# Patient Record
Sex: Female | Born: 1945
Health system: Southern US, Community
[De-identification: ages and names within clinical notes are randomized; demographics above are authoritative.]

## PROBLEM LIST (undated history)

## (undated) DIAGNOSIS — I4891 Unspecified atrial fibrillation: Secondary | ICD-10-CM

## (undated) DIAGNOSIS — E785 Hyperlipidemia, unspecified: Secondary | ICD-10-CM

## (undated) DIAGNOSIS — M25561 Pain in right knee: Secondary | ICD-10-CM

## (undated) DIAGNOSIS — G5600 Carpal tunnel syndrome, unspecified upper limb: Secondary | ICD-10-CM

## (undated) DIAGNOSIS — D631 Anemia in chronic kidney disease: Secondary | ICD-10-CM

## (undated) DIAGNOSIS — K219 Gastro-esophageal reflux disease without esophagitis: Secondary | ICD-10-CM

## (undated) DIAGNOSIS — D500 Iron deficiency anemia secondary to blood loss (chronic): Secondary | ICD-10-CM

## (undated) DIAGNOSIS — M069 Rheumatoid arthritis, unspecified: Secondary | ICD-10-CM

## (undated) DIAGNOSIS — M1990 Unspecified osteoarthritis, unspecified site: Secondary | ICD-10-CM

## (undated) DIAGNOSIS — I5032 Chronic diastolic (congestive) heart failure: Secondary | ICD-10-CM

## (undated) DIAGNOSIS — D649 Anemia, unspecified: Secondary | ICD-10-CM

## (undated) DIAGNOSIS — I739 Peripheral vascular disease, unspecified: Secondary | ICD-10-CM

## (undated) DIAGNOSIS — R17 Unspecified jaundice: Secondary | ICD-10-CM

## (undated) DIAGNOSIS — I639 Cerebral infarction, unspecified: Secondary | ICD-10-CM

## (undated) DIAGNOSIS — I10 Essential (primary) hypertension: Secondary | ICD-10-CM

## (undated) DIAGNOSIS — T45515A Adverse effect of anticoagulants, initial encounter: Secondary | ICD-10-CM

## (undated) DIAGNOSIS — L570 Actinic keratosis: Secondary | ICD-10-CM

## (undated) DIAGNOSIS — R945 Abnormal results of liver function studies: Secondary | ICD-10-CM

## (undated) DIAGNOSIS — K449 Diaphragmatic hernia without obstruction or gangrene: Secondary | ICD-10-CM

## (undated) DIAGNOSIS — E119 Type 2 diabetes mellitus without complications: Secondary | ICD-10-CM

## (undated) DIAGNOSIS — K759 Inflammatory liver disease, unspecified: Secondary | ICD-10-CM

## (undated) DIAGNOSIS — F419 Anxiety disorder, unspecified: Secondary | ICD-10-CM

## (undated) DIAGNOSIS — N183 Chronic kidney disease, stage 3 (moderate): Secondary | ICD-10-CM

## (undated) DIAGNOSIS — R609 Edema, unspecified: Secondary | ICD-10-CM

## (undated) HISTORY — DX: Gastro-esophageal reflux disease without esophagitis: K21.9

## (undated) HISTORY — DX: Actinic keratosis: L57.0

## (undated) HISTORY — PX: HAND SURGERY: SHX662

## (undated) HISTORY — DX: Iron deficiency anemia secondary to blood loss (chronic): D50.0

## (undated) HISTORY — DX: Rheumatoid arthritis, unspecified: M06.9

## (undated) HISTORY — DX: Diaphragmatic hernia without obstruction or gangrene: K44.9

## (undated) HISTORY — PX: JOINT REPLACEMENT: SHX530

## (undated) HISTORY — DX: Edema, unspecified: R60.9

## (undated) HISTORY — DX: Adverse effect of anticoagulants, initial encounter: T45.515A

## (undated) HISTORY — DX: Unspecified atrial fibrillation: I48.91

## (undated) HISTORY — DX: Unspecified jaundice: R17

## (undated) HISTORY — DX: Chronic kidney disease, stage 3 (moderate): N18.3

## (undated) HISTORY — DX: Carpal tunnel syndrome, unspecified upper limb: G56.00

## (undated) HISTORY — DX: Essential (primary) hypertension: I10

## (undated) HISTORY — PX: CATARACT EXTRACTION: SUR2

## (undated) HISTORY — DX: Anemia, unspecified: D64.9

## (undated) HISTORY — DX: Inflammatory liver disease, unspecified: K75.9

## (undated) HISTORY — DX: Anemia in chronic kidney disease: D63.1

## (undated) HISTORY — DX: Chronic diastolic (congestive) heart failure: I50.32

## (undated) HISTORY — DX: Hyperlipidemia, unspecified: E78.5

---

## 1968-04-04 DIAGNOSIS — K759 Inflammatory liver disease, unspecified: Secondary | ICD-10-CM

## 1968-04-04 HISTORY — DX: Inflammatory liver disease, unspecified: K75.9

## 1997-10-27 ENCOUNTER — Other Ambulatory Visit: Admission: RE | Admit: 1997-10-27 | Discharge: 1997-10-27 | Payer: Self-pay | Admitting: Family Medicine

## 1998-02-24 ENCOUNTER — Ambulatory Visit (HOSPITAL_COMMUNITY): Admission: RE | Admit: 1998-02-24 | Discharge: 1998-02-24 | Payer: Self-pay | Admitting: Obstetrics and Gynecology

## 1998-02-24 ENCOUNTER — Encounter: Payer: Self-pay | Admitting: Obstetrics and Gynecology

## 1998-06-23 ENCOUNTER — Encounter: Payer: Self-pay | Admitting: Endocrinology

## 1998-06-23 ENCOUNTER — Ambulatory Visit (HOSPITAL_COMMUNITY): Admission: RE | Admit: 1998-06-23 | Discharge: 1998-06-23 | Payer: Self-pay | Admitting: Endocrinology

## 1998-07-01 ENCOUNTER — Ambulatory Visit (HOSPITAL_COMMUNITY): Admission: RE | Admit: 1998-07-01 | Discharge: 1998-07-01 | Payer: Self-pay | Admitting: Endocrinology

## 1998-07-01 ENCOUNTER — Encounter: Payer: Self-pay | Admitting: Endocrinology

## 1999-01-14 ENCOUNTER — Other Ambulatory Visit: Admission: RE | Admit: 1999-01-14 | Discharge: 1999-01-14 | Payer: Self-pay | Admitting: Orthopedic Surgery

## 1999-02-18 ENCOUNTER — Encounter: Admission: RE | Admit: 1999-02-18 | Discharge: 1999-02-18 | Payer: Self-pay | Admitting: Family Medicine

## 1999-02-18 ENCOUNTER — Encounter: Payer: Self-pay | Admitting: Family Medicine

## 1999-08-23 ENCOUNTER — Ambulatory Visit (HOSPITAL_COMMUNITY): Admission: RE | Admit: 1999-08-23 | Discharge: 1999-08-23 | Payer: Self-pay | Admitting: *Deleted

## 1999-08-23 ENCOUNTER — Encounter: Payer: Self-pay | Admitting: *Deleted

## 1999-09-16 ENCOUNTER — Encounter: Payer: Self-pay | Admitting: *Deleted

## 1999-09-16 ENCOUNTER — Ambulatory Visit (HOSPITAL_COMMUNITY): Admission: RE | Admit: 1999-09-16 | Discharge: 1999-09-16 | Payer: Self-pay | Admitting: *Deleted

## 1999-09-16 ENCOUNTER — Encounter (INDEPENDENT_AMBULATORY_CARE_PROVIDER_SITE_OTHER): Payer: Self-pay | Admitting: Specialist

## 2000-02-21 ENCOUNTER — Encounter: Admission: RE | Admit: 2000-02-21 | Discharge: 2000-02-21 | Payer: Self-pay | Admitting: Family Medicine

## 2000-02-21 ENCOUNTER — Encounter: Payer: Self-pay | Admitting: Family Medicine

## 2000-03-02 ENCOUNTER — Encounter: Admission: RE | Admit: 2000-03-02 | Discharge: 2000-03-02 | Payer: Self-pay | Admitting: Family Medicine

## 2000-03-02 ENCOUNTER — Encounter: Payer: Self-pay | Admitting: Family Medicine

## 2000-03-13 ENCOUNTER — Encounter (INDEPENDENT_AMBULATORY_CARE_PROVIDER_SITE_OTHER): Payer: Self-pay | Admitting: Specialist

## 2000-03-13 ENCOUNTER — Encounter: Payer: Self-pay | Admitting: Endocrinology

## 2000-03-13 ENCOUNTER — Other Ambulatory Visit: Admission: RE | Admit: 2000-03-13 | Discharge: 2000-03-13 | Payer: Self-pay | Admitting: Endocrinology

## 2000-03-13 ENCOUNTER — Encounter: Admission: RE | Admit: 2000-03-13 | Discharge: 2000-03-13 | Payer: Self-pay | Admitting: Endocrinology

## 2001-03-05 ENCOUNTER — Encounter: Admission: RE | Admit: 2001-03-05 | Discharge: 2001-03-05 | Payer: Self-pay | Admitting: Family Medicine

## 2001-03-05 ENCOUNTER — Encounter: Payer: Self-pay | Admitting: Family Medicine

## 2001-07-24 ENCOUNTER — Encounter (INDEPENDENT_AMBULATORY_CARE_PROVIDER_SITE_OTHER): Payer: Self-pay | Admitting: *Deleted

## 2001-07-24 ENCOUNTER — Ambulatory Visit (HOSPITAL_COMMUNITY): Admission: RE | Admit: 2001-07-24 | Discharge: 2001-07-24 | Payer: Self-pay | Admitting: *Deleted

## 2002-01-31 ENCOUNTER — Encounter: Payer: Self-pay | Admitting: Family Medicine

## 2002-01-31 ENCOUNTER — Ambulatory Visit (HOSPITAL_COMMUNITY): Admission: RE | Admit: 2002-01-31 | Discharge: 2002-01-31 | Payer: Self-pay | Admitting: Family Medicine

## 2002-03-21 ENCOUNTER — Encounter: Payer: Self-pay | Admitting: Family Medicine

## 2002-03-21 ENCOUNTER — Encounter: Admission: RE | Admit: 2002-03-21 | Discharge: 2002-03-21 | Payer: Self-pay | Admitting: Family Medicine

## 2002-03-26 ENCOUNTER — Ambulatory Visit (HOSPITAL_BASED_OUTPATIENT_CLINIC_OR_DEPARTMENT_OTHER): Admission: RE | Admit: 2002-03-26 | Discharge: 2002-03-27 | Payer: Self-pay | Admitting: Orthopedic Surgery

## 2002-12-15 ENCOUNTER — Emergency Department (HOSPITAL_COMMUNITY): Admission: AD | Admit: 2002-12-15 | Discharge: 2002-12-15 | Payer: Self-pay | Admitting: Emergency Medicine

## 2002-12-15 ENCOUNTER — Encounter: Payer: Self-pay | Admitting: Emergency Medicine

## 2003-04-11 ENCOUNTER — Encounter: Admission: RE | Admit: 2003-04-11 | Discharge: 2003-04-11 | Payer: Self-pay | Admitting: Endocrinology

## 2004-04-26 ENCOUNTER — Encounter: Admission: RE | Admit: 2004-04-26 | Discharge: 2004-04-26 | Payer: Self-pay | Admitting: Family Medicine

## 2005-04-27 ENCOUNTER — Encounter: Admission: RE | Admit: 2005-04-27 | Discharge: 2005-04-27 | Payer: Self-pay | Admitting: Family Medicine

## 2006-05-01 ENCOUNTER — Encounter: Admission: RE | Admit: 2006-05-01 | Discharge: 2006-05-01 | Payer: Self-pay | Admitting: Family Medicine

## 2006-05-12 ENCOUNTER — Encounter: Admission: RE | Admit: 2006-05-12 | Discharge: 2006-05-12 | Payer: Self-pay | Admitting: Family Medicine

## 2006-10-17 ENCOUNTER — Ambulatory Visit (HOSPITAL_COMMUNITY): Admission: RE | Admit: 2006-10-17 | Discharge: 2006-10-17 | Payer: Self-pay | Admitting: *Deleted

## 2006-10-17 ENCOUNTER — Encounter (INDEPENDENT_AMBULATORY_CARE_PROVIDER_SITE_OTHER): Payer: Self-pay | Admitting: *Deleted

## 2007-04-05 HISTORY — PX: TOTAL HIP ARTHROPLASTY: SHX124

## 2007-06-14 ENCOUNTER — Encounter: Admission: RE | Admit: 2007-06-14 | Discharge: 2007-06-14 | Payer: Self-pay

## 2008-02-11 ENCOUNTER — Inpatient Hospital Stay (HOSPITAL_COMMUNITY): Admission: RE | Admit: 2008-02-11 | Discharge: 2008-02-14 | Payer: Self-pay | Admitting: Orthopedic Surgery

## 2008-02-24 ENCOUNTER — Inpatient Hospital Stay (HOSPITAL_COMMUNITY): Admission: EM | Admit: 2008-02-24 | Discharge: 2008-02-26 | Payer: Self-pay | Admitting: Emergency Medicine

## 2008-06-16 ENCOUNTER — Encounter: Admission: RE | Admit: 2008-06-16 | Discharge: 2008-06-16 | Payer: Self-pay | Admitting: Family Medicine

## 2008-06-20 ENCOUNTER — Encounter: Admission: RE | Admit: 2008-06-20 | Discharge: 2008-06-20 | Payer: Self-pay | Admitting: Family Medicine

## 2009-01-29 ENCOUNTER — Encounter: Admission: RE | Admit: 2009-01-29 | Discharge: 2009-01-29 | Payer: Self-pay | Admitting: Family Medicine

## 2009-06-17 ENCOUNTER — Encounter: Admission: RE | Admit: 2009-06-17 | Discharge: 2009-06-17 | Payer: Self-pay | Admitting: Family Medicine

## 2009-12-03 ENCOUNTER — Encounter (HOSPITAL_COMMUNITY): Admission: RE | Admit: 2009-12-03 | Discharge: 2010-03-03 | Payer: Self-pay | Admitting: Unknown Physician Specialty

## 2010-03-10 ENCOUNTER — Encounter (HOSPITAL_COMMUNITY)
Admission: RE | Admit: 2010-03-10 | Discharge: 2010-05-04 | Payer: Self-pay | Source: Home / Self Care | Attending: Unknown Physician Specialty | Admitting: Unknown Physician Specialty

## 2010-04-25 ENCOUNTER — Encounter: Payer: Self-pay | Admitting: Family Medicine

## 2010-04-26 ENCOUNTER — Encounter: Payer: Self-pay | Admitting: Family Medicine

## 2010-05-12 ENCOUNTER — Other Ambulatory Visit: Payer: Self-pay | Admitting: Family Medicine

## 2010-05-12 DIAGNOSIS — Z1231 Encounter for screening mammogram for malignant neoplasm of breast: Secondary | ICD-10-CM

## 2010-05-26 ENCOUNTER — Telehealth: Payer: Self-pay | Admitting: Cardiovascular Disease

## 2010-05-26 NOTE — Telephone Encounter (Signed)
REQUESTING FIRST AVAILABLE CONSULT. PLEASE CALL PT AT [phone].  PLEASE LET LORI FROM DR. Tawana Scale OFFICE KNOW WHEN APPT IS SO SHE CAN FAX RECORDS

## 2010-05-27 ENCOUNTER — Ambulatory Visit (INDEPENDENT_AMBULATORY_CARE_PROVIDER_SITE_OTHER): Payer: Medicare Other | Admitting: *Deleted

## 2010-05-27 ENCOUNTER — Inpatient Hospital Stay: Admission: AD | Admit: 2010-05-27 | Payer: Self-pay | Source: Ambulatory Visit | Admitting: Cardiology

## 2010-05-27 ENCOUNTER — Inpatient Hospital Stay (HOSPITAL_COMMUNITY)
Admission: EM | Admit: 2010-05-27 | Discharge: 2010-05-28 | DRG: 309 | Disposition: A | Discharge status: Home / Self Care | Payer: Medicare Other | Attending: Cardiology | Admitting: Cardiology

## 2010-05-27 ENCOUNTER — Emergency Department (HOSPITAL_COMMUNITY): Payer: Medicare Other

## 2010-05-27 DIAGNOSIS — Z0000 Encounter for general adult medical examination without abnormal findings: Secondary | ICD-10-CM

## 2010-05-27 DIAGNOSIS — I4891 Unspecified atrial fibrillation: Secondary | ICD-10-CM

## 2010-05-27 DIAGNOSIS — K219 Gastro-esophageal reflux disease without esophagitis: Secondary | ICD-10-CM | POA: Diagnosis present

## 2010-05-27 DIAGNOSIS — I10 Essential (primary) hypertension: Secondary | ICD-10-CM | POA: Diagnosis present

## 2010-05-27 DIAGNOSIS — M069 Rheumatoid arthritis, unspecified: Secondary | ICD-10-CM | POA: Diagnosis present

## 2010-05-27 DIAGNOSIS — IMO0002 Reserved for concepts with insufficient information to code with codable children: Secondary | ICD-10-CM

## 2010-05-27 DIAGNOSIS — N390 Urinary tract infection, site not specified: Secondary | ICD-10-CM | POA: Diagnosis present

## 2010-05-27 DIAGNOSIS — E785 Hyperlipidemia, unspecified: Secondary | ICD-10-CM | POA: Diagnosis present

## 2010-05-27 DIAGNOSIS — F341 Dysthymic disorder: Secondary | ICD-10-CM | POA: Diagnosis present

## 2010-05-27 DIAGNOSIS — D649 Anemia, unspecified: Secondary | ICD-10-CM | POA: Diagnosis present

## 2010-05-27 DIAGNOSIS — R079 Chest pain, unspecified: Secondary | ICD-10-CM

## 2010-05-27 DIAGNOSIS — Z96659 Presence of unspecified artificial knee joint: Secondary | ICD-10-CM

## 2010-05-27 DIAGNOSIS — E119 Type 2 diabetes mellitus without complications: Secondary | ICD-10-CM | POA: Diagnosis present

## 2010-05-27 LAB — COMPREHENSIVE METABOLIC PANEL WITH GFR
ALT: 14 U/L (ref 0–35)
AST: 17 U/L (ref 0–37)
Albumin: 3.2 g/dL — ABNORMAL LOW (ref 3.5–5.2)
Alkaline Phosphatase: 84 U/L (ref 39–117)
BUN: 10 mg/dL (ref 6–23)
CO2: 25 meq/L (ref 19–32)
Calcium: 8.8 mg/dL (ref 8.4–10.5)
Chloride: 99 meq/L (ref 96–112)
Creatinine, Ser: 0.55 mg/dL (ref 0.4–1.2)
GFR calc Af Amer: >60 mL/min (ref >60)
GFR calc non Af Amer: >60 mL/min (ref >60)
Glucose, Bld: 141 mg/dL — ABNORMAL HIGH (ref 70–99)
Potassium: 3.1 meq/L — ABNORMAL LOW (ref 3.5–5.1)
Sodium: 137 meq/L (ref 135–145)
Total Bilirubin: 0.8 mg/dL (ref 0.3–1.2)
Total Protein: 7.5 g/dL (ref 6.0–8.3)

## 2010-05-27 LAB — URINALYSIS, ROUTINE W REFLEX MICROSCOPIC
Bilirubin Urine: NEGATIVE
Ketones, ur: NEGATIVE mg/dL
Protein, ur: NEGATIVE mg/dL
Urine Glucose, Fasting: NEGATIVE mg/dL
pH: 6 (ref 5.0–8.0)

## 2010-05-27 LAB — DIFFERENTIAL
Basophils Absolute: 0 10*3/uL (ref 0.0–0.1)
Eosinophils Relative: 3 % (ref 0–5)
Lymphs Abs: 3.2 10*3/uL (ref 0.7–4.0)
Monocytes Absolute: 1.1 10*3/uL — ABNORMAL HIGH (ref 0.1–1.0)
Monocytes Relative: 8 % (ref 3–12)
Neutro Abs: 8.3 10*3/uL — ABNORMAL HIGH (ref 1.7–7.7)
Neutrophils Relative %: 64 % (ref 43–77)

## 2010-05-27 LAB — CBC
HCT: 39.2 % (ref 36.0–46.0)
Hemoglobin: 12.4 g/dL (ref 12.0–15.0)
MCH: 24.1 pg — ABNORMAL LOW (ref 26.0–34.0)
MCHC: 31.6 g/dL (ref 30.0–36.0)
MCV: 76.1 fL — ABNORMAL LOW (ref 78.0–100.0)
Platelets: 355 K/uL (ref 150–400)
RBC: 5.15 MIL/uL — ABNORMAL HIGH (ref 3.87–5.11)
RDW: 15.3 % (ref 11.5–15.5)
WBC: 12.9 K/uL — ABNORMAL HIGH (ref 4.0–10.5)

## 2010-05-27 LAB — PROTIME-INR: INR: 1.13 (ref 0.00–1.49)

## 2010-05-27 LAB — CK TOTAL AND CKMB (NOT AT ARMC)
CK, MB: 2.5 ng/mL (ref 0.3–4.0)
Relative Index: INVALID (ref 0.0–2.5)
Total CK: 65 U/L (ref 7–177)

## 2010-05-27 LAB — URINE MICROSCOPIC-ADD ON

## 2010-05-27 LAB — APTT: aPTT: 27 s (ref 24–37)

## 2010-05-27 LAB — TSH: TSH: 1.059 u[IU]/mL (ref 0.350–4.500)

## 2010-05-27 LAB — TROPONIN I: Troponin I: <0.01 ng/mL (ref 0.00–0.06)

## 2010-05-27 LAB — GLUCOSE, CAPILLARY
Glucose-Capillary: 219 mg/dL — ABNORMAL HIGH (ref 70–99)
Glucose-Capillary: 211 mg/dL — ABNORMAL HIGH (ref 70–99)

## 2010-05-28 DIAGNOSIS — I4891 Unspecified atrial fibrillation: Secondary | ICD-10-CM

## 2010-05-28 LAB — CARDIAC PANEL(CRET KIN+CKTOT+MB+TROPI)
CK, MB: 2.4 ng/mL (ref 0.3–4.0)
Relative Index: INVALID (ref 0.0–2.5)
Relative Index: INVALID (ref 0.0–2.5)
Total CK: 55 U/L (ref 7–177)
Troponin I: 0.01 ng/mL (ref 0.00–0.06)
Troponin I: 0.03 ng/mL (ref 0.00–0.06)

## 2010-05-28 LAB — HEPARIN LEVEL (UNFRACTIONATED): Heparin Unfractionated: <0.1 IU/mL — ABNORMAL LOW (ref 0.30–0.70)

## 2010-05-28 LAB — CBC
MCHC: 30.6 g/dL (ref 30.0–36.0)
Platelets: 324 10*3/uL (ref 150–400)
RDW: 15.5 % (ref 11.5–15.5)
WBC: 11 10*3/uL — ABNORMAL HIGH (ref 4.0–10.5)

## 2010-05-28 LAB — GLUCOSE, CAPILLARY: Glucose-Capillary: 192 mg/dL — ABNORMAL HIGH (ref 70–99)

## 2010-06-03 NOTE — Discharge Summary (Addendum)
NAMESARAHBETH, [name]                ACCOUNT NO.:  [id number]  MEDICAL RECORD NO.:  [id number]           PATIENT TYPE:  I  LOCATION:  2502                         FACILITY:  [facility]  PHYSICIAN:  [name], M.D.DATE OF BIRTH:  1945/10/15  DATE OF ADMISSION:  05/27/2010 DATE OF DISCHARGE:  05/28/2010                              DISCHARGE SUMMARY   DISCHARGE DIAGNOSES: 1. New onset atrial fibrillation with rapid ventricular response.     a.     Spontaneously converted normal sinus rhythm.     b.     Discharged with diltiazem and metoprolol as well Pradaxa for      anticoagulation. 2. Chest pain, cardiac enzymes negative. 3. Urinary tract infection, discharged with prescription for Cipro. 4. Hypertension. 5. Diabetes mellitus. 6. Rheumatoid arthritis. 7. Anxiety and depression. 8. Gastroesophageal reflux disease. 9. Hyperlipidemia. 10.History of infantile jaundice with hepatitis. 11.Total left knee replacement in 2009. 12.Anemia with hemoglobin of 10.0, instructed to follow up with     primary care provider.  PENDING LABORATORY TESTS: 1. A 2-D echocardiogram. 2. The patient should have BMET with visit with [name] to check     her potassium level, and this was instructed to the office to make     in the appointment.  HOSPITAL COURSE:  [name] is a 65 year old female with a past medical history including hypertension, diabetes and hyperlipidemia who presented to Dr. Yevonne Pax office with complaints of atypical chest pain, with significant cough.  She had noted that her heart rate have been erratic for a long time.  She had no other symptoms except for mild shortness of breath.  In the office, she was known to me in AFib with RVR with EKG showing AFib with a rate of 160-170.  She was admitted to [facility] for further evaluation.  On admission, her lab work did show that she was hypokalemic at 3.1 and her diuretic was discontinued.  She was continued  on her daily potassium.  Cardiac enzymes were cycled which were negative x3 and TSH was within normal limits.  UA did demonstrate a probable UTI with positive nitrate, trace leukocytes and many bacteria.  She will be discharged with a prescription for Cipro.  A 2-D echocardiogram was performed, but those results are still pending at this time.  The patient converted to normal sinus rhythm spontaneously.  Dr. Deborah [name] has seen and examined her today and feels she is stable for discharge.  He would like her to continue diltiazem in a p.o. form of 240 mg daily and Toprol-XL 50 mg daily as well as start Pradaxa for anticoagulation.  The patient was made aware of her urinary tract infection as well as her anemia and is instructed to follow up with her primary care provider for these things.  Dr. Deborah [name] has seen and examined her today and feels she is stable for discharge.  DISCHARGE LABORATORY DATA:  WBC 11, hemoglobin 10.0, hematocrit 32.7, platelet count 324.  Sodium 137, potassium 3.1, chloride 99, CO2 25, glucose 141, BUN 10, creatinine 0.55.  LFTs within normal limits on May 27, 2010, with  exception of mildly decreased albumin at 3.2. Cardiac enzymes negative x3, TSH 1.059.  UA showed positive nitrite, trace leukocytes, few squamous epithelial cells and many bacteria.  STUDIES: 1. Chest x-ray May 27, 2010, showed mild cardiomegaly increased     since 2009, with stable lungs with chronic interstitial prominence. 2. A 2-D echocardiogram, pending at this time.  DISCHARGE MEDICATIONS: 1. Cipro 250 mg every 12 h. for 3 days. 2. Dabigatran 150 mg q.12 h. 3. Diltiazem 240 mg daily. 4. Nitroglycerin sublingual 0.4 mg every 5 minutes as needed. 5. Toprol-XL 50 mg daily. 6. Bupropion SR 150 mg 2 tablets daily. 7. Fish oil 1000 units 2 capsules daily. 8. Klor-Con 10 mEq daily. 9. Leflunomide 10 mg daily. 10.Metformin XR 500 mg 6 tablets daily. 11.Nexium 40 mg daily. 12.Orencia  125 mg one injection subcutaneously weekly on Friday. 13.Prednisone 5 mg daily. 14.Prempro 0.3/1.5 mg daily. 15.Simvastatin 20 mg daily. 16.Vitamin B12 1000 units 1 tablet daily. 17.Vitamin D 400 units 1 tablet daily.  DISPOSITION:  [name] will be discharged in stable condition to home. She is to increase activity slowly and follow a heart-healthy diet.  She is instructed to follow up with her primary care provider within 1 week to ensure UTIs and also for evaluation of her anemia as her blood counts are mildly decreased this admission.  She will follow up with [name] in approximately 2 weeks and our office, we will call her with this appointment.  I have also requested the office staff to go in for a BMET to ensure stability for potassium as it  was low on admission.  She was on a diuretic, however, which has been discontinued.  Her daily low- dose potassium will be continued.  [name] may consider risk stratification at some point with possible stress testing given the patient's risk factors.  Dr. Deborah [name] has seen and examined her today and feels she is stable for discharge.  Duration of discharge encounter greater than 30 minutes including physician and PA time.     [name], P.A.C.   ______________________________ [name], M.D.    DD/MEDQ  D:  05/28/2010  T:  05/29/2010  Job:  [id number]  cc:   [name], M.D.  Electronically Signed by [name] M.D. on 06/03/2010 01:07:33 PM Electronically Signed by [name]  on 06/07/2010 05:10:31 PM

## 2010-06-08 ENCOUNTER — Ambulatory Visit (INDEPENDENT_AMBULATORY_CARE_PROVIDER_SITE_OTHER): Payer: Medicare Other | Admitting: Nurse Practitioner

## 2010-06-08 DIAGNOSIS — I4891 Unspecified atrial fibrillation: Secondary | ICD-10-CM

## 2010-06-08 DIAGNOSIS — R079 Chest pain, unspecified: Secondary | ICD-10-CM

## 2010-06-08 DIAGNOSIS — I10 Essential (primary) hypertension: Secondary | ICD-10-CM

## 2010-06-08 DIAGNOSIS — R5381 Other malaise: Secondary | ICD-10-CM

## 2010-06-11 NOTE — H&P (Signed)
NAMESTEFANEE, MCKELL                ACCOUNT NO.:  [id number]  MEDICAL RECORD NO.:  [id number]           PATIENT TYPE:  LOCATION:                                 FACILITY:  PHYSICIAN:  Cassell Clement, M.D.      DATE OF BIRTH:  DATE OF ADMISSION:  05/27/2010 DATE OF DISCHARGE:                             HISTORY & PHYSICAL   CHIEF COMPLAINT:  Chest pain.  HISTORY OF PRESENT ILLNESS:  [name] is a pleasant 65 year old white female who has known hypertension.  She is referred to our office from primary care.  She has been complaining of atypical chest pain.  She notes that all this started this past Monday.  Her significant other had been driving from [place] and was calling her on the phone every 15 minutes.  She does have a tendency to be somewhat anxious and is only added to her anxiety level.  She was not able to sleep because he arrived home very late.  By Tuesday, she was having some sharp chest pain.  She also noted that she had significant cough and was coughing very hard.  She noted that her heart rate has been erratic for "a long time."  She has had no other associated symptoms except for maybe some mild shortness of breath.  Here in the office today, she is noted to be in atrial fibrillation with a rapid ventricular response.  Her EKG shows a heart rate 152 but on exam, she is clearly in the 160-170 range.  She is subsequently admitted for further evaluation.  PAST MEDICAL HISTORY:  She had a total left hip replacement in 2009. She does have rheumatoid arthritis, hypertension, diabetes, infantile jaundice with a history of hepatitis, anxiety and depression.  Her hypertension has been longstanding.  Her diabetes is treated with oral agents.  She does also have a history of GERD and hyperlipidemia.  ALLERGIES:  None.  INTOLERANCES:  ASPIRIN in high doses causes an upset stomach as well as VICODIN.  CURRENT MEDICATIONS: 1. Metformin 500 mg a day. 2.  Amiloride 5/50 (diuretic) daily for hypertension. 3. Prednisone 5 mg a day. 4. Nexium 40 mg a day. 5. Bupropion 150 mg a day. 6. Prempro 0.3 daily. 7. Hydrocodone p.r.n. 8. Xanax p.r.n. 9. Fish oil daily. 10.Aleve p.r.n. 11.Leflunomide 10 mg a day. 12.Potassium 10 mEq a day. 13.Orencia weekly. 14.Simvastatin 20 mg a day. 15.Vitamin D daily.  FAMILY HISTORY:  Positive for diabetes, heart disease, hyperlipidemia, lung cancer and stroke.  SOCIAL HISTORY:  She is a former smoker.  She is divorced.  She does not drink alcohol and she is retired.  She does live with a significant other.  PAST SURGICAL HISTORY:  Includes cataracts, hand surgery and hip replacement.  REVIEW OF SYSTEMS:  She has had a cough that has been nonproductive. She described it as hacky and raspy in nature.  She has had some chest pain that was worse with coughing as well as with deep breathing.  She has had no recent fever, flu.  She does have chronic pain from her arthritis.  She is not very  active and is on engage in a routineexercise program.  She denies being lightheaded or dizzy.  She really has no awareness that her heart is beating this fast.  She has not had syncope.  All other review of systems are negative.  PHYSICAL EXAMINATION:  GENERAL:  She is a very pleasant elderly white female who is in no acute distress. SKIN:  Warm and dry.  Color is unremarkable. VITAL SIGNS:  Her weight 188 pounds, blood pressure 128/110 sitting, 134/96 standing, heart rate 152 but up into the 160s by apical exam, respirations are 20.  She is afebrile. HEENT:  Unremarkable except pupils are unequal from previous cataract surgery. NECK:  Supple.  No masses.  No bruits. LUNGS:  Fairly clear. CARDIAC:  Irregular and very tachycardic rhythm.  There is no murmur that I could appreciate. ABDOMEN:  Obese, soft. EXTREMITIES:  Without edema. MUSCULOSKELETAL:  Gait and range of motion to be intact. NEUROLOGIC:  No gross  focal deficits.  PERTINENT LABS:  Her EKG is reviewed with Dr. Deborah Chalk shows atrial fibrillation with rapid ventricular response.  OVERALL IMPRESSION: 1. Chest pain. 2. Atrial fibrillation with rapid ventricular response. 3. Longstanding hypertension. 4. Diabetes. 5. Rheumatoid arthritis. 6. Anxiety and depression.  PLAN:  She will be admitted to the hospital for anticoagulation and rate control.  We will place her on IV Cardizem as well as low-dose beta blocker therapy.  A  2-D echocardiogram will be obtained as well as complete lab work.  The further treatment plan to follow per Dr. Yevonne Pax discretion.     Rosalio Macadamia, N.P.   ______________________________ Cassell Clement, M.D.    LCG/MEDQ  D:  05/27/2010  T:  05/27/2010  Job:  [id number]  Electronically Signed by Norma Fredrickson N.P. on 06/09/2010 01:18:33 PM Electronically Signed by Cassell Clement M.D. on 06/10/2010 04:18:42 PM

## 2010-06-21 ENCOUNTER — Ambulatory Visit: Payer: Self-pay

## 2010-06-22 ENCOUNTER — Ambulatory Visit (HOSPITAL_COMMUNITY): Payer: Medicare Other | Attending: Cardiology | Admitting: Radiology

## 2010-06-22 DIAGNOSIS — R079 Chest pain, unspecified: Secondary | ICD-10-CM

## 2010-06-22 DIAGNOSIS — R0602 Shortness of breath: Secondary | ICD-10-CM

## 2010-06-22 DIAGNOSIS — I4891 Unspecified atrial fibrillation: Secondary | ICD-10-CM

## 2010-06-22 NOTE — Progress Notes (Signed)
[facility] SITE 3 NUCLEAR MED [address] [place] [address] [phone]  Cardiology Nuclear Med Study [name] female 24-Feb-1946   Nuclear Med Background Indication for Stress Test:  Evaluation for Ischemia and [facility] 05/29/10 New AFIB. History: 05/27/10 Echo:EF=65% Cardiac Risk Factors: Hypertension, Lipids and NIDDM  Symptoms:  Chest Pain (last date of chest discomfort 2 weeks ago), Palpitations and SOB   Nuclear Pre-Procedure Caffeine/Decaff Intake:  None NPO After: 7:30pm   Lungs:  clear IV 0.9% NS with Angio Cath:  22g  IV Site: R Hand  IV Started by:  Irean Hong, RN  Chest Size (in):  42 Cup Size:   DD  Height: 5\' 7"  (1.702 m)  Weight:  187 lb (84.823 kg)  BMI:  Body mass index is 29.29 kg/(m^2). Tech Comments:  NA    Nuclear Med Study 1 or 2 day study: 1 day  Stress Test Type:  Eugenie Birks  Reading MD: Arvilla Meres, MD  Order Authorizing Provider:  T.Brackbill  Resting Radionuclide: Technetium 20m Tetrofosmin  Resting Radionuclide Dose: 11 mCi   Stress Radionuclide:  Technetium 63m Tetrofosmin  Stress Radionuclide Dose: 33 mCi           Stress Protocol Rest HR: 88 Stress HR:113  Rest BP: 133/72 Stress BP:155/67  Exercise Time:  NA min METS: NA  Predicted HR:  % of Maximum: NA        Dose of Adenosine:  NA mg Dose of Lexiscan:  .4 mg  Dose of Atropine:  NA mg Dose of Dobutamine:  NA mcg/kg/min (at max HR)  Stress Test Technologist: Milana Na, EMT-P  Nuclear Technologist:  Domenic Polite, CNMT     Rest Procedure:  Myocardial perfusion imaging was performed at rest 45 minutes following the intravenous administration of Technetium 79m Tetrofosmin. Rest ECG: NSR  Stress Procedure:  The patient received IV Lexiscan 0.4 mg over 15-seconds.  Technetium 47m Tetrofosmin injected at 30-seconds.  There were no significant changes with Lexiscan.  Quantitative spect images were obtained after a 45 minute delay. Stress  ECG: No significant change from baseline ECG  QPS Raw Data Images:  On stress images appears to be a breast shadow covering the anterolateral wall. Stress Images:  Decreased uptake in the anterolateral wall. Rest Images:  Normal homogeneous uptake in all areas of the myocardium. Subtraction (SDS):  There is a fixed anterior defect that is most consistent with breast attenuation. However cannot rule out mild anterolateral ischemia.  Findings Risk Category:  Probably normal Lexiscan Myoview. Clinically Abnormal:  Dyspnea Ischemia:  See below Fixed Defect:  No LV Dysfunction:  No Transient Ischemic Dilatation (Normal <1.22):  1.10 Lung/Heart Ratio (Normal <0.45): .31  Quantitative Gated Spect Images QGS EDV:  75 ml QGS ESV: 21 ml QGS cine images:  Normal wall motion QGS EF: 72 %  Impression Exercise Capacity:  Lexiscan with no exercise. BP Response:  N/A Clinical Symptoms:  There is dyspnea. ECG Impression:  No significant ST segment change with Lexiscan. Comparison with Prior Nuclear Study: No previous images available for review.  Overall Impression:  Probably normal stress nuclear study. There is a small reversible defect in the anterolateral wall which appears to be due to shifting breast attenuation; however cannot exclude mild ischemia.

## 2010-06-29 ENCOUNTER — Ambulatory Visit: Payer: Self-pay

## 2010-07-01 ENCOUNTER — Ambulatory Visit (INDEPENDENT_AMBULATORY_CARE_PROVIDER_SITE_OTHER): Payer: Medicare Other | Admitting: Cardiology

## 2010-07-01 ENCOUNTER — Encounter: Payer: Self-pay | Admitting: Cardiology

## 2010-07-01 VITALS — BP 136/82 | HR 108 | Wt 186.0 lb

## 2010-07-01 DIAGNOSIS — E119 Type 2 diabetes mellitus without complications: Secondary | ICD-10-CM

## 2010-07-01 DIAGNOSIS — I480 Paroxysmal atrial fibrillation: Secondary | ICD-10-CM

## 2010-07-01 DIAGNOSIS — E1169 Type 2 diabetes mellitus with other specified complication: Secondary | ICD-10-CM | POA: Insufficient documentation

## 2010-07-01 DIAGNOSIS — I119 Hypertensive heart disease without heart failure: Secondary | ICD-10-CM

## 2010-07-01 DIAGNOSIS — M069 Rheumatoid arthritis, unspecified: Secondary | ICD-10-CM

## 2010-07-01 DIAGNOSIS — R799 Abnormal finding of blood chemistry, unspecified: Secondary | ICD-10-CM

## 2010-07-01 DIAGNOSIS — R7989 Other specified abnormal findings of blood chemistry: Secondary | ICD-10-CM

## 2010-07-01 DIAGNOSIS — I4891 Unspecified atrial fibrillation: Secondary | ICD-10-CM

## 2010-07-01 DIAGNOSIS — R0789 Other chest pain: Secondary | ICD-10-CM

## 2010-07-01 DIAGNOSIS — E7800 Pure hypercholesterolemia, unspecified: Secondary | ICD-10-CM

## 2010-07-01 DIAGNOSIS — E876 Hypokalemia: Secondary | ICD-10-CM

## 2010-07-01 DIAGNOSIS — I10 Essential (primary) hypertension: Secondary | ICD-10-CM | POA: Insufficient documentation

## 2010-07-01 DIAGNOSIS — R000 Tachycardia, unspecified: Secondary | ICD-10-CM | POA: Insufficient documentation

## 2010-07-01 MED ORDER — METOPROLOL SUCCINATE ER 100 MG PO TB24: 100.0000 mg | ORAL_TABLET | Freq: Every day | ORAL | Status: DC

## 2010-07-01 NOTE — Assessment & Plan Note (Signed)
Since her last office visit here the patient had a LexiScan Myoview stress test which showed no evidence of ischemia and which showed an ejection fraction of 72%.  This was done on 06/22/10.

## 2010-07-01 NOTE — Progress Notes (Signed)
History of Present Illness: This pleasant 65 year old woman is seen back for a followup office visit.  She has a history of multiple risk factors for coronary artery disease.  In February she was hospitalized with paroxysmal atrial fibrillation with a rapid ventricular response.  She is now on Pradaxa 150 mg twice a day.  As far she knows she has remained in normal sinus rhythm since her discharge from the hospital.  She had a recent LexiScan Myoview stress test which showed no significant ischemia.  She is diabetic.  She's not having any hypoglycemic episodes.  She previously had been on simvastatin but is now on Lipitor 10 mg daily and his not having any side effects on the Lipitor.  Current Outpatient Prescriptions  Medication Sig Dispense Refill  . Abatacept (ORENCIA Machias) Inject into the skin once a week.        [name] atorvastatin (LIPITOR) 10 MG tablet Take 10 mg by mouth daily.        [name] buPROPion (WELLBUTRIN SR) 150 MG 12 hr tablet Take 150 mg by mouth 2 (two) times daily.        . Cholecalciferol (VITAMIN D) 400 UNITS capsule Take 400 Units by mouth daily.        [name] Conj Estrog-Medroxyprogest Ace (PREMPRO PO) Take 0.3 mg by mouth daily.        . dabigatran (PRADAXA) 150 MG CAPS Take 150 mg by mouth every 12 (twelve) hours.        [name] diltiazem (CARDIZEM LA) 240 MG 24 hr tablet Take 240 mg by mouth daily.        [name] esomeprazole (NEXIUM) 40 MG capsule Take 40 mg by mouth daily.        . ferrous sulfate ([employer] IRON) 325 (65 FE) MG tablet Take 325 mg by mouth daily with breakfast. Taking 65mg  daily       . leflunomide (ARAVA) 10 MG tablet Take 10 mg by mouth daily.        . metFORMIN (GLUCOPHAGE-XR) 500 MG 24 hr tablet Take 500 mg by mouth 2 (two) times daily.        . Naproxen Sodium (ALEVE PO) Take by mouth as needed.        . nitroGLYCERIN (NITROSTAT) 0.4 MG SL tablet Place 0.4 mg under the tongue every 5 (five) minutes as needed.        . Omega-3 Fatty Acids (FISH OIL) 1000 MG CAPS Take by mouth daily.         . potassium chloride (KLOR-CON) 10 MEQ CR tablet Take 10 mEq by mouth 2 (two) times daily.        . predniSONE (DELTASONE) 5 MG tablet Take 5 mg by mouth daily.        [name] triamterene-hydrochlorothiazide (DYAZIDE) 37.5-25 MG per capsule Take 1 capsule by mouth daily.        . vitamin B-12 (CYANOCOBALAMIN) 1000 MCG tablet Take 1,000 mcg by mouth daily.        [name] DISCONTD: metoprolol (TOPROL-XL) 50 MG 24 hr tablet Take 50 mg by mouth daily.        . metoprolol (TOPROL XL) 100 MG 24 hr tablet Take 1 tablet (100 mg total) by mouth daily.  30 tablet  11    No Known Allergies  Patient Active Problem List  Diagnoses  . Paroxysmal atrial fibrillation  . Hypertension  . Hypercholesterolemia  . Diabetes mellitus  . Rheumatoid arthritis  . Sinus tachycardia  . Chest pain, atypical  History  Smoking status  . Former Smoker  Smokeless tobacco  . Not on file    History  Alcohol Use: Not on file    No family history on file.  Review of Systems: Constitutional: no fever chills diaphoresis or fatigue or change in weight.  Head and neck: no hearing loss, no epistaxis, no photophobia or visual disturbance. Respiratory: No cough, shortness of breath or wheezing. Cardiovascular: No chest pain peripheral edema, palpitations. Gastrointestinal: No abdominal distention, no abdominal pain, no change in bowel habits hematochezia or melena. Genitourinary: No dysuria, no frequency, no urgency, no nocturia. Musculoskeletal:No arthralgias, no back pain, no gait disturbance or myalgias. Neurological: No dizziness, no headaches, no numbness, no seizures, no syncope, no weakness, no tremors. Hematologic: No lymphadenopathy, no easy bruising. Psychiatric: No confusion, no hallucinations, no sleep disturbance.    Physical Exam: Filed Vitals:   07/01/10 0951  BP: 136/82  Pulse: 108   This is a well-developed well-nourished woman in no distress.Pupils equal and reactive.   Extraocular  Movements are full.  There is no scleral icterus.  The mouth and pharynx are normal.  The neck is supple.  The carotids reveal no bruits.  The jugular venous pressure is normal.  The thyroid is not enlarged.  There is no lymphadenopathy.The chest is clear to percussion and auscultation. There are no rales or rhonchi. Expansion of the chest is symmetrical.The precordium is quiet.  The first heart sound is normal.  The second heart sound is physiologically split.  There is no murmur gallop rub or click.  There is no abnormal lift or heave.The abdomen is soft and nontender. Bowel sounds are normal. The liver and spleen are not enlarged. There Are no abdominal masses. There are no bruits.The pedal pulses are good.  There is no phlebitis or edema.  There is no cyanosis or clubbing.Strength is normal and symmetrical in all extremities.  There is no lateralizing weakness.  There are no sensory deficits.  Assessment / Plan:

## 2010-07-01 NOTE — Assessment & Plan Note (Signed)
The patient has had no further episodes of atrial fibrillation since she was last seen earlier this month.  She was hospitalized initially on 05/27/10 with atrial fibrillation and a rapid ventricular response and she converted quickly in the hospital.

## 2010-07-01 NOTE — Assessment & Plan Note (Signed)
Presently her blood pressure is upper normal.  However her heart rate is 108 sinus tachycardia and we will increase her Toprol from 50 mg to 100 mg daily

## 2010-08-04 ENCOUNTER — Other Ambulatory Visit: Payer: Self-pay | Admitting: Otolaryngology

## 2010-08-04 DIAGNOSIS — E049 Nontoxic goiter, unspecified: Secondary | ICD-10-CM

## 2010-08-06 ENCOUNTER — Ambulatory Visit
Admission: RE | Admit: 2010-08-06 | Discharge: 2010-08-06 | Disposition: A | Discharge status: Home / Self Care | Payer: Medicare Other | Source: Ambulatory Visit | Attending: Family Medicine | Admitting: Family Medicine

## 2010-08-06 DIAGNOSIS — Z1231 Encounter for screening mammogram for malignant neoplasm of breast: Secondary | ICD-10-CM

## 2010-08-09 ENCOUNTER — Ambulatory Visit
Admission: RE | Admit: 2010-08-09 | Discharge: 2010-08-09 | Disposition: A | Discharge status: Home / Self Care | Payer: Medicare Other | Source: Ambulatory Visit | Attending: Otolaryngology | Admitting: Otolaryngology

## 2010-08-09 DIAGNOSIS — E049 Nontoxic goiter, unspecified: Secondary | ICD-10-CM

## 2010-08-17 NOTE — Op Note (Signed)
NAMEMISSY, BAKSH                ACCOUNT NO.:  [id number]   MEDICAL RECORD NO.:  [id number]          PATIENT TYPE:  AMB   LOCATION:  ENDO                         FACILITY:  [facility]   PHYSICIAN:  Georgiana Spinner, M.D.    DATE OF BIRTH:  07-15-1945   DATE OF PROCEDURE:  10/17/2006  DATE OF DISCHARGE:                               OPERATIVE REPORT   PROCEDURE:  Colonoscopy.   INDICATIONS:  Hemoccult positivity.   ANESTHESIA:  Demerol 100 mg, Versed 10 mg, Benadryl 25 mg.   PROCEDURE:  With the patient mildly sedated in the left lateral  decubitus position the Pentax videoscopic colonoscope was inserted into  the rectum and passed under direct vision to the cecum identified by  ileocecal valve and appendiceal orifice both of which were photographed.  In the cecum was a small polyp that was photographed and removed using  hot biopsy forceps technique setting of 20/150 blended current.  The  endoscope was then withdrawn taking circumferential views of remaining  colonic mucosa stopping at 50 cm from anal verge at which point a second  polyp was seen and it was removed using snare cautery technique with the  same setting of 20/150 blended current.  We then stopped at 20 cm from  anal verge at which point, what appeared to be inverted diverticulum was  noted and it was photographed and a mucosal biopsy was performed to rule  out adenomatous polyp. The endoscope was then withdrawn to the rectum  which appeared normal on direct, showed hemorrhoids on retroflexed view.  The endoscope was straightened and withdrawn.  The patient's vital  signs, pulse oximeter remained stable.  The patient tolerated procedure  well without apparent complications.   FINDINGS:  Polyp of cecum at 50 cm from anal verge and questionable  polyp at 20 cm which may be an inverted diverticulum.   PLAN:  Await biopsy reports.  The patient will call me for results and  follow-up with me as an outpatient.     ______________________________  Georgiana Spinner, M.D.     GMO/MEDQ  D:  10/17/2006  T:  10/17/2006  Job:  [id number]   cc:   Rema Fendt, M.D.  [facility]

## 2010-08-17 NOTE — H&P (Signed)
NAMEAUNDREA, [name]                ACCOUNT NO.:  [id number]   MEDICAL RECORD NO.:  [id number]          PATIENT TYPE:  INP   LOCATION:  NA                           FACILITY:  [facility]   PHYSICIAN:  Ollen Gross, M.D.    DATE OF BIRTH:  1945/09/29   DATE OF ADMISSION:  DATE OF DISCHARGE:                              HISTORY & PHYSICAL   DATE OF ADMISSION:  February 11, 2008.   CHIEF COMPLAINT:  Left hip pain.   HISTORY OF PRESENT ILLNESS:  [name] is a 65 year old female with  significant rheumatoid arthritis.  She had been having progressively  worsening pain in her left hip.  She has difficulty with activities of  daily living.  X-rays revealed that she has end-stage arthritic changes  of the hip.  The patient has elected to proceed with a left total hip  arthroplasty.   ALLERGIES:  ASPIRIN upsets her stomach.   CURRENT MEDICATIONS:  1. Metformin 500 mg once a day.  2. Amiloride/HCTZ 5/50 one tablet once a day.  3. Prednisone 5 mg a day.  4. Nexium 40 mg a day.  5. Bupropion SR 150 mg twice a day.  6. Prempro 0.3 mg once a day.  7. Enbrel 50 mg/mL injection weekly on Fridays.  8. Calcium 1200 mg plus 800 mg vitamin D daily.  9. Women's One A Day multivitamins.  10.Darvocet 1-3 tablets a day.  11.Alprazolam 0.50 one-half to 1 tablet twice a day p.r.n. anxiety.   PAST MEDICAL HISTORY:  Includes  1. Rheumatoid arthritis.  2. Hypertension with a recent stress test  3. Anxiety/depression.  4. Diabetes.  5. History of infantile jaundice with history of hepatitis in the      past, unknown which type.   PAST SURGERIES:  Includes multiple surgical procedures on her hands from  1992 to 2000, including synovectomies and joint replacements and tumor  removals without any problems with anesthesia performed by Dr. Teressa Senter.   FAMILY MEDICAL HISTORY:  Father is deceased from a stroke at 75 years of  age.  Mother is deceased from pneumonia at the age of 90.   SOCIAL HISTORY:   Patient is divorced.  She did smoke in the past. She  has not for the last 20-30 years. She has one grown child.  She has some  friends who will help care for her postop. She lives in a single-family  home with three steps to the main entrance.  She does have a living  will.   REVIEW OF SYSTEMS:  Negative for any neurologic, ENT issues.  PULMONARY:  Last bronchitis was in January. Pneumonia 3 years previous. Never been  hospitalized for either.  CARDIOVASCULAR: Unremarkable.  GU: Her reflux  disease is well controlled.  She has not had any recent issues with her  liver.  GU: Unremarkable.  ENDOCRINE:  Her glucose is fairly well-  controlled.   PHYSICAL EXAM:  VITALS:  Height is 5'7, weight is 185, blood pressure  is 128/86, pulse of 70, respirations 12, patient is afebrile.  GENERAL:  This is a healthy-appearing female, conscious,  alert and  appropriate, appears to be a good historian.  Appears to be in no  extreme distress.  HEENT: Head was normocephalic.  Pupils equal, round and reactive.  Gross  hearing is intact.  NECK: Supple.  No palpable lymphadenopathy.  Good range of motion.  CHEST:  Lung sounds were clear and equal bilaterally.  HEART:  Regular rate and rhythm with occasional skipped beat.  ABDOMEN: Soft, nontender.  Bowel sounds present.  EXTREMITIES:  Upper extremities had good range of motion of her  shoulders, elbows and wrists.  She had some very typical of rheumatoid  changes of her hands.  Both hips had full extension.  She was able to  flex her left hip up to 110.  She had 5 degrees of external rotation and  10 degrees of internal rotation with hip pain.  Her right hip  she had 0  degrees of internal rotation, about 5 degrees of external rotation.  Both knees had full extension and flexion back to 130. No instability.  No effusions.  Calves soft. Good motion of her ankle.  PERIPHERAL VASCULAR:  Carotid pulses were 2+, no bruits.  Radial pulses  were 2+.  Her  posterior tibial pulses were 1+.  She had no lower  extremity edema.  NEURO:  The patient was conscious, alert and appropriate.  She appeared  to be a good historian.  No gross neurologic defects.  Breast, rectal and GU exams are deferred at this time.   IMPRESSION:  1. End-stage degenerative changes left hip  2. Hypertension.  3. Diabetes.  4. History of infantile jaundice with a history of hepatitis.  5. Rheumatoid arthritis.  6. Recent stress test by Dr. Patty Sermons reported be normal.   PLAN:  The patient will undergo all routine labs and tests prior to  having a left total hip arthroplasty by Dr. Lequita Halt at [facility] on her February 11, 2008.      Jamelle Rushing, P.A.      Ollen Gross, M.D.  Electronically Signed    RWK/MEDQ  D:  02/04/2008  T:  02/04/2008  Job:  [id number]   cc:   Ollen Gross, M.D.  Fax: [phone]

## 2010-08-17 NOTE — Op Note (Signed)
NAMELAURELAI, [name]                ACCOUNT NO.:  [id number]   MEDICAL RECORD NO.:  [id number]          PATIENT TYPE:  INP   LOCATION:  0002                         FACILITY:  [facility]   PHYSICIAN:  Ollen Gross, M.D.    DATE OF BIRTH:  01-09-1946   DATE OF PROCEDURE:  02/11/2008  DATE OF DISCHARGE:                               OPERATIVE REPORT   PREOPERATIVE DIAGNOSIS:  Rheumatoid arthritis, left hip.   POSTOPERATIVE DIAGNOSIS:  Rheumatoid arthritis, left hip.   PROCEDURE:  Left total hip arthroplasty.   SURGEON:  Ollen Gross, M.D.   ASSISTANT:  Avel Peace,  PA-C.   ANESTHESIA:  General.   ESTIMATED BLOOD LOSS:  400.   DRAINS:  Hemovac x1.   COMPLICATIONS:  None.   CONDITION:  Stable to recovery.   BRIEF CLINICAL NOTE:  [name] is a 65 year old female with end-stage  arthritic changes in the left hip with progressively worsening pain and  dysfunction.  She has failed nonoperative management and presents for  total hip arthroplasty.   PROCEDURE IN DETAIL:  After the successful administration of general  anesthetic, the patient is placed the right lateral decubitus position  with the left side up and held with the hip positioner.  The left lower  extremity is isolated from her perineum with plastic drapes and prepped  and draped in the usual sterile fashion.  A short posterolateral  incision is made with a 10 blade through the subcutaneous tissue to the  level of fascia lata, which is incised in line with the skin incision.  The sciatic nerve is palpated and protected and the short external  rotators isolated off the femur.  Capsulectomy is performed and the hip  is dislocated.  The center of her femoral head is marked and the trial  prosthesis placed such that the center of the trial head corresponds to  the center of her native femoral head.  Osteotomy lines are marked on  the femoral neck and osteotomy made with an oscillating saw.  The  femoral head is removed  and the femur retracted anteriorly to gain  acetabular exposure.   Acetabular retractors are placed and the labrum and osteophytes removed.  Acetabular reaming starts at 45 mm, coursing increments of 2 to 51 mm  and a 52-mm Pinnacle acetabular shell is placed in anatomic position,  with excellent purchase and transfixed with 2 dome screws.  The 32-mm  neutral +4 trial liner is placed.   The femur is prepared with the canal finder and irrigation.  Axial  reaming is performed to 13.5 mm proximal, reaming to an 18 D, and the  sleeve machined to a small.  An 51 D small trial sleeve is placed with  18 x 13 stem and 36 +8 neck matching native anteversion.  A 32.0 head is  placed and the hip is reduced, with outstanding stability.  She has full  extension, full external rotation, 70 degrees flexion, 40 degrees  adduction and 90 degrees internal rotation and 90 degrees of flexion and  70 degrees of internal rotation.  By placing the left  leg on top of the  right, the leg lengths were felt to be equal.  The hip was then  dislocated and the trials removed.  The permanent Apex hole eliminator  was placed into the acetabular shell, then the permanent 32-mm neutral  +4 Marathon liner was placed.  On the femoral side we placed an 18 D  small sleeve with an 18 x 13 stem and a 36 +8 neck matching native  anteversion.  A 32.0 head is placed and the hip is reduced, with the  same stability parameters.  The wound is copiously irrigated with saline  solution and the short rotators reattached to the femur through drill  holes.  The fascia lata is closed over a Hemovac drain with interrupted  #1 Vicryl, the subcu closed with #1 and then 2-0 Vicryl and subcuticular  running 4-0 Monocryl.  The incision is cleaned and dried and Steri-  Strips and a bulky sterile dressing are applied.  She is then placed  into a knee immobilizer, awakened and transported to recovery in stable  condition.      Ollen Gross,  M.D.  Electronically Signed     FA/MEDQ  D:  02/11/2008  T:  02/12/2008  Job:  [id number]

## 2010-08-20 NOTE — Discharge Summary (Signed)
[name], [name]                ACCOUNT NO.:  [id number]   MEDICAL RECORD NO.:  [id number]          PATIENT TYPE:  INP   LOCATION:  1601                         FACILITY:  [facility]   PHYSICIAN:  Ollen Gross, M.D.    DATE OF BIRTH:  03-01-1946   DATE OF ADMISSION:  02/11/2008  DATE OF DISCHARGE:  02/14/2008                               DISCHARGE SUMMARY   ADMITTING DIAGNOSES:  1. Osteoarthritis left hip.  2. Hypertension.  3. Diabetes.  4. History of infantile jaundice with history of hepatitis.  5. Rheumatoid arthritis.  6. Anxiety.  7. Depression.   DISCHARGE DIAGNOSES:  1. Rheumatoid arthritis left hip status post left total hip      arthroplasty.  2. Mild postop blood loss anemia, did not require transfusion.  3. Osteoarthritis left hip.  4. Hypertension.  5. Diabetes.  6. History of infantile jaundice with history of hepatitis.  7. Anxiety.  8. Depression.   PROCEDURE:  February 11, 2008:  Left total hip.   SURGEON:  Ollen Gross, M.D.   ASSISTANT:  Ellwood Dense P.A.C.   ANESTHESIA:  General.   CONSULTATIONS:  None.   BRIEF HISTORY:  Ms. Dildine is a 65 year old female with osteoarthritis  of the left hip with progressive worsening of pain and dysfunction  failed nonoperative management and now presents for a total hip  arthroplasty.   LABORATORY DATA:  Preop CBC showed hemoglobin for 14.6, hematocrit 43.5,  white cell count 10.0, platelets 303.  Postop hemoglobin 12.8 and it  dropped down to 11.1.  Last H and H 10.7 and 30.8.  PT/PTT preop 14.3  and 28 respectively.  INR of 1.1.  Serial protimes followed per Coumadin  protocol.  Last PT/INR 17.9 and 1.4.  Chem panel on admission all within  normal limits.  Serial B-mets and all electrolytes were negative and  within normal limits.  Preop UA cloudy, otherwise negative.  Blood group  type O negative.  A 2-view chest taken on February 07, 2008:  No acute  disease.  Hip films February 07, 2008:  Findings  compatible with given  diagnosis of rheumatoid arthritis with secondary osteoarthritis of both  hips, no acute findings.   HOSPITAL COURSE:  The patient was admitted to [facility],  tolerated the procedure well, and was later transferred to the recovery  room and then orthopedic floor.  Started on PCA and p.o. analgesic pain  control following the surgery.  Given 24 hours of postop IV antibiotics.  She was doing pretty well on the morning of day #1, started getting up  out of bed.  With a history of diabetes, metformin was on hold.  Put her  on a sliding scale initially.  She was steroid-dependent and did receive  steroids interop and postop and then on a tapering dose back to her  normal regimen.  She started getting up out of bed on day #1 and by day  #2 is doing much better.  Incision was checked.  Dressing changed.  Incision looked good.  Had good output.  Lytes were good.  Hemoglobin  was stable.  Continued to progress well with Physical Therapy and was  walking over 80 feet by postop day #3.  Tolerating medications.  Meeting  goals and was discharged home.   DISCHARGE PLAN:  1. The patient is discharged home on February 14, 2008.  2. Discharge diagnoses, please see above.  3. Discharge medications include Dilaudid, Robaxin, Coumadin.   DISCHARGE DIET:  Heart-healthy diabetic diet.   DISCHARGE FOLLOWUP:  Follow up in 2 weeks.   ACTIVITY:  Partial weightbearing 25-30% left leg.   DISPOSITION:  Home.   CONDITION ON DISCHARGE:  Improved.      Alexzandrew L. Perkins, P.A.C.      Ollen Gross, M.D.  Electronically Signed    ALP/MEDQ  D:  03/26/2008  T:  03/26/2008  Job:  [id number]   cc:   Ollen Gross, M.D.  Fax: [phone]   Cassell Clement, M.D.  Fax: [phone]

## 2010-08-20 NOTE — Op Note (Signed)
[place]  Patient:    [name], [name] Visit Number: [id number] MRN: [id number]          Service Type: END Location: ENDO Attending Physician:  Sabino Gasser Dictated by:   Sabino Gasser, M.D. Proc. Date: 07/24/01 Admit Date:  07/24/2001                             Operative Report  PROCEDURE PERFORMED:  Colonoscopy.  ENDOSCOPIST:  Sabino Gasser, M.D.  INDICATIONS FOR PROCEDURE:  Colon polyp.  ANESTHESIA:  Demerol 130 mg, Versed 10 mg.  DESCRIPTION OF PROCEDURE:  With the patient mildly sedated in the left lateral decubitus position, the Olympus videoscopic colonoscope was inserted in the rectum and passed under direct vision into the cecum.  The cecum was identified by the ileocecal valve and appendiceal orifice.  In the cecum was a polyp which was photographed as well and removed using hot biopsy forceps technique setting of 20/20 blended current.  Once done, the endoscope was withdrawn, taking circumferential views of the remaining colonic mucosa, stopping only then in the rectum, which appeared normal on direct and showed hemorrhoids on retroflex view.  The endoscope was straightened and withdrawn. Patients vital signs and pulse oximeter remained stable.  The patient tolerated the procedure well and without apparent complications.  FINDINGS:  Polyp of cecum, internal hemorrhoids.  PLAN:  Await biopsy report.  The patient will call me for results and follow up with me as an outpatient. Dictated by:   Sabino Gasser, M.D. Attending Physician:  Sabino Gasser DD:  07/24/01 TD:  07/24/01 Job: [id number]

## 2010-08-20 NOTE — H&P (Signed)
[name], TOTINO                ACCOUNT NO.:  [id number]   MEDICAL RECORD NO.:  [id number]          PATIENT TYPE:  INP   LOCATION:  1316                         FACILITY:  [facility]   PHYSICIAN:  Alvy Beal, MD    DATE OF BIRTH:  [birth year]   DATE OF ADMISSION:  02/24/2008  DATE OF DISCHARGE:  02/26/2008                              HISTORY & PHYSICAL   CHIEF COMPLAINT:  Left hip pain for 24 hours.   HISTORY OF PRESENT ILLNESS:  [name] is a very pleasant 64 year old  female who recently underwent a left total hip replacement approximately  two and a half weeks ago by Dr. Lequita Halt.  Unfortunately, yesterday, on  02/23/2008, she pushed herself hard doing some physical therapy at home  and did a new activity from a standing to a seated position, and the  patient states that while she was walking down the hall, she had an  excruciating 10/10 pain in her left hip, and was therefore brought by  ambulance to [facility] for evaluation.  The patient described  the pain as a sharp pain in her left proximal anterior thigh while  moving it and it is relieved by lying still.  She denies any numbness,  tingling or coolness to the extremity.  Denies any radiation of the  pain.   PAST MEDICAL HISTORY:  1. Rheumatoid arthritis.  2. Hypertension.  3. Anxiety/depression.  4. Diabetes.  5. History of infantile jaundice.   PAST SURGICAL HISTORY:  Left hip replacement done by Dr. Lequita Halt on  February 11, 2008.   FAMILY HISTORY:  Father deceased at 36 from a stroke.  Mother deceased  at 22 from pneumonia.   SOCIAL HISTORY:  The patient is currently divorced and lives alone at  home.  She is a nonsmoker and nondrinker.   REVIEW OF SYSTEMS:  GENERAL:  A well-nourished 65 year old female in  mild distress.  SKIN:  No rashes or lumps.  LUNGS:  History of pneumonia  approximately three years ago.  HEART:  Unremarkable.  GU:  History of a  reflux that is well-controlled.  EXTREMITIES:   No difficulty with range  of motion or gross deficits in her upper extremities.  Left lower  extremity:  The patient admits to weakness and difficulty lifting leg.  NEUROLOGICAL:  The patient has no numbness or tingling.   PHYSICAL EXAMINATION:  LUNGS:  Clear to auscultation with no wheezes.  HEART:  Regular rate and rhythm.  No murmurs.  ABDOMEN:  Soft, nontender, nondistended.  EXTREMITIES:  Left lower extremity is point tender over the anterior  lateral thigh.  The patient is able to move toes.  Distal pulses are  intact.  No neurovascular deficits.  The patient is unable to lift her  left leg compared to her right side.  The patient has no pain with  isolated knee or ankle range of motion.  She has no pain with assist to  the hip flexion, but she does have increased pain with internal and  external rotation of the hip, and she has no back pain at  this time.   CURRENT MEDICATIONS:  1. Prednisone 5 mg p.o. daily.  2. Nexium 40 mg p.o. daily.  3. Amlodipine/hydrochlorothiazide 25/50 once p.o. daily.  4. Bupropion SR 150 mg p.o. b.i.d.  5. Metformin 500 mg p.o. daily.  6. Coumadin 7.5 mg p.o. daily.  7. Enbrel 50 mg/mL injection weekly.   RADIOLOGY:  Radiographs taken at [facility] emergency room demonstrated  a nondisplaced transverse fracture of the left greater trochanter along  the proximal left femoral prosthesis.   ASSESSMENT:  Left hip fracture.   PLAN:  At this time, we are going to admit the patient to Dr. Shon Baton As  the orthopedist.  Dr. Shon Baton has contacted his partner, Dr. Lequita Halt.  He  will be by on Monday to see this patient with a probable nonoperative  management of this fracture.  The patient is to maintain bedrest while  awaiting evaluation by Dr. Lequita Halt.  She is to continue all current  medications and maintain her knee immobilizer.      Christina Reese, PA      Alvy Beal, MD  Electronically Signed    AC/MEDQ  D:  04/23/2008  T:   04/23/2008  Job:  [id number]

## 2010-08-20 NOTE — Op Note (Signed)
NAME:  [name], [name]                          ACCOUNT NO.:  [id number]   MEDICAL RECORD NO.:  [id number]                   PATIENT TYPE:  AMB   LOCATION:  DSC                                  FACILITY:  [facility]   PHYSICIAN:  Katy Fitch. Naaman Plummer., M.D.          DATE OF BIRTH:  Jun 12, 1945   DATE OF PROCEDURE:  03/26/2002  DATE OF DISCHARGE:                                 OPERATIVE REPORT   PREOPERATIVE DIAGNOSIS:  End stage rheumatoid arthritis involving left hand,  index, long, ring and small finger, metacarpophalangeal joints with severe  ulnar drift, bone on bone arthropathy, intrinsic tightness and mild swan  neck deformity of fingers.   POSTOPERATIVE DIAGNOSIS:  End stage rheumatoid arthritis involving left  hand, index, long, ring and small finger, metacarpophalangeal joints with  severe ulnar drift, bone on bone arthropathy, intrinsic tightness and mild  swan neck deformity of fingers.   OPERATION PERFORMED:  1. Implant arthroplasty of left index finger metacarpophalangeal joint with     size 20 pyro carbon implant arthroplasty followed by ulnar intrinsic     release and extensor realignment and radial collateral ligament     reconstruction.  2. Implant arthroplasty of left long finger metacarpophalangeal joint     utilizing a size 10 pyro carbon implant followed by intrinsic release and     extensor realignment.  3. Implant arthroplasty of left ring finger metacarpophalangeal joint with     size 10 pyro carbon implant followed by intrinsic release and extensor     realignment.  4. Implant arthroplasty of left small finger metacarpophalangeal joint with     size 10 pyro carbon implant, ulnar intrinsic release and dorsal radial     transfer of extensor digiti minimi.   SURGEON:  Katy Fitch. Sypher, M.D.   ASSISTANT:  Jonni Sanger, P.A.   ANESTHESIA:  General by LMA.   ANESTHESIOLOGIST:  Janetta Hora. Gelene Mink, M.D.   INDICATIONS FOR PROCEDURE:  The patient is a well  known 65 year old woman  with rheumatoid arthritis.  I have known her for approximately 15 years.  She is status post multiple procedures including left elbow synovectomy and  tendon realignment of her left hand as well as implant arthroplasty  reconstruction of her right hand.   She has had a progressive ulnar drift predicament involving the left hand,  increasing pain in the metacarpophalangeal joints and increasing intrinsic  tightness.  Recent x-rays revealed progressive bony deformity developing at  the base of the proximal phalanges and subluxation of her MP joints, leading  [place] to determine that it is an appropriate time to proceed with implant  arthroplasty.   Due to the fact that we now have superior pyro carbon implants, we have  recommended proceeding with this form of implant arthroplasty rather than  silicone implant arthroplasty.   DESCRIPTION OF PROCEDURE:  The patient was brought to the operating room and  placed in  supine position upon the operating table.  Following induction of  general anesthesia by LMA, the left arm was prepped with Betadine soap and  solution and sterilely draped.  1 gm of Ancef was administered an IV  prophylactic antibiotic.   Following exsanguination of the limb with an Esmarch bandage, an arterial  tourniquet applied to 240 mmHg and later inflated to 260 mmHg due to break  through bleeding beneath the tourniquet.  The procedure commenced with  resection of the previous surgical scar.  Subcutaneous tissues were  meticulously dissected taking care to spare the extensor mechanisms.  The  dorsal veins and sensory nerves were protected.  The capsules were  identified and teno lysed off the adhesions from the previous surgery.   The extensor mechanisms had all subluxed to the ulnar aspect of the  metacarpal heads and were quite tight.  The ulnar intrinsics were tight.  The capsules were exposed by release of the radial sagittal fibers on the   radial aspect of the extensor for the index, long and ring fingers.  In the  small finger, the extensor tendon was split due to more satisfactory radial  sagittal fibers.  The capsules were preserved.  The joints were exposed and  synovectomy completed for the index, long, ring and small finger MP joints.   Ascension jigs and cutting tools were used to properly prepare the  metacarpal heads with resection of the articular surfaces and preservation  of the radial collateral ligaments as well as resection of the base of the  proximal phalanges at the appropriate angled cuts of 27.5 degrees for the  metacarpals and 5 degrees for the proximal phalanges.  Utilizing the canal  finders and the alignment guides, the various metacarpals and phalanges were  prepared for size 20 implants in the index finger, size 10 in the long, ring  and small fingers.   The implants were placed with no-touch technique after thorough preparation  of the bone with triple antibiotic solution and suction.  The extensor  mechanisms were then realigned by ulnar intrinsic release followed by  intrinsic stretching and Littler ulnar lateral band releases in the index,  long and ring fingers as well as lengthening of the abductor digiti minimi  in the small finger.  The capsules were repaired with an individual layer  with 4-0 Vicryl with knots buried followed by centralization of the extensor  tendons utilizing 3-0 Ethibond centralizing the extensors at the peak of the  metacarpal heads.   Very satisfactory correction of the ulnar intrinsic tightness was achieved.  The ulnar drift was fully corrected and the MP joints were noted to be  stable without signs of subluxation.   AP and lateral images were obtained documenting satisfactory placement of  the implants.  There was very slight ulnar deviation of the ring finger implant; however, given the satisfactory seating and alignment of the  finger, in my judgment this was  going to be corrected entirely with soft  tissue training techniques in the postoperative period.  The wounds were  lavaged with sterile saline followed by release of the tourniquet.  Total  tourniquet time was two hours and 26 minutes.  At approximately one hour and 15 minutes we did rewrap the arm to relieve  congestion that occurred from blood flow probably through the intramedullary  canal of the humerus beneath the tourniquet.   The tourniquet was elevated  250 mmHg following this second wrap.  The  wounds were lavaged with sterile  saline followed by triple antibiotic  solution.  The tourniquet was released with immediate capillary refill in  all fingers and the thumb.  The wound was repaired with intradermal 3-0  Prolene and Steri-Strips followed by application of a voluminous gauze  dressing radicalizing the MP joints with loops of gauze and Webril followed  by placement of a voluminous gauze dressing and a volar plaster splint  maintaining the MP joints in full extension and slight radial deviation.   The patient was awakened from anesthesia and transferred to the recovery  room with stable vital signs.                                                Katy Fitch Naaman Plummer., M.D.    RVS/MEDQ  D:  03/26/2002  T:  03/26/2002  Job:  [id number]   cc:   Areatha Keas, M.D.  [address]  [address]  [place]  [place] [address]  Fax: [phone]

## 2010-08-20 NOTE — Procedures (Signed)
[facility]  Patient:    [name], [name]                       MRN: [id number] Proc. Date: 09/16/99 Adm. Date:  [id number] Disc. Date: [id number] Attending:  Sabino Gasser                           Procedure Report  PROCEDURE:  Upper endoscopy with biopsy and dilation.  INDICATION FOR PROCEDURE:  Dysphagia.  ANESTHESIA:  Demerol 100 mg, Versed 10 mg was given intravenously and preoperative antibiotics were given.  DESCRIPTION OF PROCEDURE:  With the patient mildly sedated in room 2 of radiology, the Olympus video endoscope was inserted in the mouth and passed under direct vision through the esophagus where there was a slight stricture seen and photographed. We advanced distal to this to find the distal-most esophagus and the  Z line was somewhat indistinct. There was a question of Barretts esophagus. We biopsied this area. We advanced the endoscope into the stomach. The fundus and body were well visualized and erythematous. We advanced also to the antrum where there were some small erosions, seen, photographed and biopsied. Subsequently, we advanced to the duodenal bulb and second portion of the duodenum both of which appeared normal and were photographed. From this point, the endoscope was slowly withdrawn taking circumferential views of the entire duodenal mucosa until the endoscope was then pulled back into the stomach, placed in retroflexion to view the stomach from below. The endoscope was then straightened and a guidewire was passed into the antrum and the scope was withdrawn taking circumferential views of the entire gastric and subsequently the esophageal mucosa which otherwise appeared normal. Over the guidewire, a 17 Savary dilator was passed without resistance and no heme seen on withdrawal. The patients vital signs and pulse oximeter remained stable. The patient tolerated the procedure well without apparent complications.  FINDINGS:   Questionable changes of Barretts esophagus distally. Await biopsy report. What appeared to be H. pylori of the body and fundus of the stomach biopsied, await biopsy report. The patient will call me for results and follow-up with me as an outpatient. Dilation of esophagus.  PLAN:  Liquid diet today, advance as tolerated tomorrow and will have the patient back to observe clinical response. DD:  09/16/99 TD:  09/18/99 Job: [id number]

## 2010-09-29 ENCOUNTER — Encounter: Payer: Self-pay | Admitting: Cardiology

## 2010-09-29 ENCOUNTER — Ambulatory Visit (INDEPENDENT_AMBULATORY_CARE_PROVIDER_SITE_OTHER): Payer: Medicare Other | Admitting: Cardiology

## 2010-09-29 VITALS — BP 130/70 | HR 98 | Wt 187.0 lb

## 2010-09-29 DIAGNOSIS — I480 Paroxysmal atrial fibrillation: Secondary | ICD-10-CM

## 2010-09-29 DIAGNOSIS — I4891 Unspecified atrial fibrillation: Secondary | ICD-10-CM

## 2010-09-29 DIAGNOSIS — E7800 Pure hypercholesterolemia, unspecified: Secondary | ICD-10-CM

## 2010-09-29 MED ORDER — DILTIAZEM HCL ER COATED BEADS 300 MG PO CP24: 300.0000 mg | ORAL_CAPSULE | Freq: Every day | ORAL | Status: DC

## 2010-09-29 NOTE — Progress Notes (Signed)
[name] Date of Birth:  Jan 09, 1946 [facility] / [facility] [address]   [address] [place], [place]  [address] [phone]           Fax   [phone]  History of Present Illness: This pleasant 65 year old woman is seen back for a scheduled followup office visit.  She has a past history of paroxysmal atrial fibrillation.  She was hospitalized in February 2012 with new atrial fibrillation.  She has been on Pradaxa since that time.  When seen in the office in June 08, 2010 she was back in normal sinus rhythm.  She returns today for routine followup and is back in atrial fibrillation with a ventricular rate of 98.  She is essentially unaware of her heart rate.  She does note some mild exertional dyspnea.  She had a nuclear stress test on 06/23/10 which was negative for ischemia she had an echocardiogram while hospitalized in February which showed normal LV function with an ejection fraction of 65% she had mildly calcified mitral valve annulus but no other significant abnormalities.  In addition to her cardiac problems she has a history of hypercholesterolemia and rheumatoid arthritis.  Current Outpatient Prescriptions  Medication Sig Dispense Refill  . Abatacept (ORENCIA [place]) Inject into the skin once a week.        [name] atorvastatin (LIPITOR) 10 MG tablet Take 10 mg by mouth daily.        [name] buPROPion (WELLBUTRIN SR) 150 MG 12 hr tablet Take 150 mg by mouth 2 (two) times daily.        . Cholecalciferol (VITAMIN D) 400 UNITS capsule Take 400 Units by mouth daily.        [name] Conj Estrog-Medroxyprogest Ace (PREMPRO PO) Take 0.3 mg by mouth daily.        . dabigatran (PRADAXA) 150 MG CAPS Take 150 mg by mouth every 12 (twelve) hours.        [name] esomeprazole (NEXIUM) 40 MG capsule Take 40 mg by mouth daily.        [name] leflunomide (ARAVA) 10 MG tablet Take 10 mg by mouth daily.        . metFORMIN (GLUCOPHAGE-XR) 500 MG 24 hr tablet Take 500 mg by mouth 2 (two) times daily.        .  metoprolol (TOPROL XL) 100 MG 24 hr tablet Take 1 tablet (100 mg total) by mouth daily.  30 tablet  11  . nitroGLYCERIN (NITROSTAT) 0.4 MG SL tablet Place 0.4 mg under the tongue every 5 (five) minutes as needed.        . Omega-3 Fatty Acids (FISH OIL) 1000 MG CAPS Take by mouth daily.        . predniSONE (DELTASONE) 5 MG tablet Take 5 mg by mouth daily.        [name] triamterene-hydrochlorothiazide (DYAZIDE) 37.5-25 MG per capsule Take 1 capsule by mouth daily.        . vitamin B-12 (CYANOCOBALAMIN) 1000 MCG tablet Take 1,000 mcg by mouth daily.        [name] DISCONTD: diltiazem (CARDIZEM LA) 240 MG 24 hr tablet Take 240 mg by mouth daily.        [name] diltiazem (CARDIZEM CD) 300 MG 24 hr capsule Take 1 capsule (300 mg total) by mouth daily.  30 capsule  11  . DISCONTD: ferrous sulfate ([facility] IRON) 325 (65 FE) MG tablet Take 325 mg by mouth daily with breakfast. Taking 65mg  daily       .  DISCONTD: Naproxen Sodium (ALEVE PO) Take by mouth as needed.        [name] DISCONTD: potassium chloride (KLOR-CON) 10 MEQ CR tablet Take 10 mEq by mouth 2 (two) times daily.          No Known Allergies  Patient Active Problem List  Diagnoses  . Paroxysmal atrial fibrillation  . Hypertension  . Hypercholesterolemia  . Diabetes mellitus  . Rheumatoid arthritis  . Sinus tachycardia  . Chest pain, atypical    History  Smoking status  . Former Smoker  Smokeless tobacco  . Not on file    History  Alcohol Use: Not on file    No family history on file.  Review of Systems: Constitutional: no fever chills diaphoresis or fatigue or change in weight.  Head and neck: no hearing loss, no epistaxis, no photophobia or visual disturbance. Respiratory: No cough, shortness of breath or wheezing. Cardiovascular: No chest pain peripheral edema, palpitations. Gastrointestinal: No abdominal distention, no abdominal pain, no change in bowel habits hematochezia or melena. Genitourinary: No dysuria, no frequency, no urgency, no  nocturia. Musculoskeletal:History of rheumatoid arthritis followed by Dr. Azzie Roup Neurological: No dizziness, no headaches, no numbness, no seizures, no syncope, no weakness, no tremors. Hematologic: No lymphadenopathy, no easy bruising. Psychiatric: No confusion, no hallucinations, no sleep disturbance.    Physical Exam: Filed Vitals:   09/29/10 0930  BP: 130/70  Pulse: 98  The general appearance feels a well-developed well-nourished woman in no distress.The head and neck exam reveals pupils equal and reactive.  Extraocular movements are full.  There is no scleral icterus.  The mouth and pharynx are normal.  The neck is supple.  The carotids reveal no bruits.  The jugular venous pressure is normal.  The  thyroid is not enlarged.  There is no lymphadenopathy.  The chest is clear to percussion and auscultation.  There are no rales or rhonchi.  Expansion of the chest is symmetrical.  The precordium is quiet.  The first heart sound is normal.  The second heart sound is physiologically split.  There is no murmur gallop rub or click.  There is no abnormal lift or heave.  The abdomen is soft and nontender.  The bowel sounds are normal.  The liver and spleen are not enlarged.  There are no abdominal masses.  There are no abdominal bruits.  Extremities reveal good pedal pulses.  There is no phlebitis or edema.  There is no cyanosis or clubbing.  Strength is normal and symmetrical in all extremities.  There is no lateralizing weakness.  There are no sensory deficits.  The skin is warm and dry.  There is no rash.   Assessment / Plan: We are increasing her diltiazem from 240 mg daily to 300 mg daily.  Continue other medicines as is.  Recheck in 3 months for followup office visit and EKG

## 2010-09-29 NOTE — Assessment & Plan Note (Signed)
The patient has a past history of paroxysmal atrial fibrillation.  She is not having any symptoms referable to the atrial fibrillation.  She denies any chest pain.  She has perhaps some mild exertional dyspnea.  She's really not aware of her pulse rate.  Her echocardiogram today shows that she is in atrial fib with a controlled ventricular response of 98.  The patient is on Pradaxa 150 mg twice a day for protection against systemic emboli.  She has not been experiencing any TIA symptoms.

## 2010-09-29 NOTE — Assessment & Plan Note (Signed)
Patient has a past history of hypercholesterolemia.  She is tolerating low dose Lipitor with no side effects.  She is trying to watch her diet carefully.  He has not been experiencing any chest pain or angina.

## 2010-10-28 ENCOUNTER — Encounter (INDEPENDENT_AMBULATORY_CARE_PROVIDER_SITE_OTHER): Payer: Medicare Other | Admitting: Ophthalmology

## 2010-10-28 DIAGNOSIS — H3530 Unspecified macular degeneration: Secondary | ICD-10-CM

## 2010-10-28 DIAGNOSIS — H2510 Age-related nuclear cataract, unspecified eye: Secondary | ICD-10-CM

## 2010-10-28 DIAGNOSIS — E11319 Type 2 diabetes mellitus with unspecified diabetic retinopathy without macular edema: Secondary | ICD-10-CM

## 2010-10-28 DIAGNOSIS — H43819 Vitreous degeneration, unspecified eye: Secondary | ICD-10-CM

## 2010-11-30 ENCOUNTER — Other Ambulatory Visit: Payer: Self-pay | Admitting: Cardiology

## 2010-11-30 NOTE — Telephone Encounter (Signed)
Fax received from pharmacy. Refill completed. Jodette Briley RN  

## 2010-12-03 ENCOUNTER — Telehealth: Payer: Self-pay | Admitting: Cardiology

## 2010-12-03 NOTE — Telephone Encounter (Signed)
Patient said that her heart rate is up to 126 and thinks it may be due to her afib. She wants to know if she should come in.

## 2010-12-03 NOTE — Telephone Encounter (Signed)
Discussed with Lawson Fiscal and will have her take an extra diltiazem now and if no better go to emergency department.  Patient states her blood pressure is in 150's and she is not feeling dizzy or lightheaded.

## 2010-12-04 ENCOUNTER — Emergency Department (HOSPITAL_COMMUNITY)
Admission: EM | Admit: 2010-12-04 | Discharge: 2010-12-04 | Disposition: A | Discharge status: Home / Self Care | Payer: Medicare Other | Attending: Emergency Medicine | Admitting: Emergency Medicine

## 2010-12-04 ENCOUNTER — Emergency Department (HOSPITAL_COMMUNITY): Payer: Medicare Other

## 2010-12-04 DIAGNOSIS — E876 Hypokalemia: Secondary | ICD-10-CM | POA: Insufficient documentation

## 2010-12-04 DIAGNOSIS — I10 Essential (primary) hypertension: Secondary | ICD-10-CM | POA: Insufficient documentation

## 2010-12-04 DIAGNOSIS — E119 Type 2 diabetes mellitus without complications: Secondary | ICD-10-CM | POA: Insufficient documentation

## 2010-12-04 DIAGNOSIS — I4891 Unspecified atrial fibrillation: Secondary | ICD-10-CM | POA: Insufficient documentation

## 2010-12-04 DIAGNOSIS — M069 Rheumatoid arthritis, unspecified: Secondary | ICD-10-CM | POA: Insufficient documentation

## 2010-12-04 DIAGNOSIS — I517 Cardiomegaly: Secondary | ICD-10-CM | POA: Insufficient documentation

## 2010-12-04 LAB — CBC
HCT: 38.3 % (ref 36.0–46.0)
Hemoglobin: 12.2 g/dL (ref 12.0–15.0)
MCHC: 31.9 g/dL (ref 30.0–36.0)
MCV: 73 fL — ABNORMAL LOW (ref 78.0–100.0)
RDW: 16 % — ABNORMAL HIGH (ref 11.5–15.5)

## 2010-12-04 LAB — DIFFERENTIAL
Eosinophils Relative: 2 % (ref 0–5)
Lymphocytes Relative: 18 % (ref 12–46)
Lymphs Abs: 2.4 10*3/uL (ref 0.7–4.0)
Monocytes Absolute: 0.9 10*3/uL (ref 0.1–1.0)
Monocytes Relative: 7 % (ref 3–12)
Neutro Abs: 9.6 10*3/uL — ABNORMAL HIGH (ref 1.7–7.7)

## 2010-12-04 LAB — BASIC METABOLIC PANEL
CO2: 23 mEq/L (ref 19–32)
Calcium: 9.4 mg/dL (ref 8.4–10.5)
Chloride: 99 mEq/L (ref 96–112)
Creatinine, Ser: 0.63 mg/dL (ref 0.50–1.10)
Glucose, Bld: 262 mg/dL — ABNORMAL HIGH (ref 70–99)
Sodium: 139 mEq/L (ref 135–145)

## 2010-12-11 NOTE — Telephone Encounter (Signed)
Agree with advice given

## 2010-12-13 ENCOUNTER — Other Ambulatory Visit: Payer: Self-pay | Admitting: *Deleted

## 2010-12-13 DIAGNOSIS — R000 Tachycardia, unspecified: Secondary | ICD-10-CM

## 2010-12-13 MED ORDER — METOPROLOL SUCCINATE ER 100 MG PO TB24: 100.0000 mg | ORAL_TABLET | Freq: Two times a day (BID) | ORAL | Status: DC

## 2010-12-13 NOTE — Telephone Encounter (Signed)
Faxed refill on metoprolol with increase change

## 2010-12-15 ENCOUNTER — Telehealth: Payer: Self-pay | Admitting: *Deleted

## 2010-12-15 NOTE — Telephone Encounter (Signed)
Spoke with patient re -metoprolol dose

## 2010-12-17 ENCOUNTER — Other Ambulatory Visit: Payer: Self-pay | Admitting: *Deleted

## 2010-12-17 DIAGNOSIS — R000 Tachycardia, unspecified: Secondary | ICD-10-CM

## 2010-12-17 MED ORDER — METOPROLOL SUCCINATE ER 100 MG PO TB24: 100.0000 mg | ORAL_TABLET | Freq: Two times a day (BID) | ORAL | Status: DC

## 2010-12-17 NOTE — Telephone Encounter (Signed)
Faxed refill for # 60 [facility]rolol to [facility]

## 2010-12-22 ENCOUNTER — Encounter: Payer: Self-pay | Admitting: Cardiology

## 2010-12-22 ENCOUNTER — Ambulatory Visit (INDEPENDENT_AMBULATORY_CARE_PROVIDER_SITE_OTHER): Payer: Medicare Other | Admitting: Cardiology

## 2010-12-22 VITALS — BP 120/70 | HR 66 | Wt 188.0 lb

## 2010-12-22 DIAGNOSIS — I4891 Unspecified atrial fibrillation: Secondary | ICD-10-CM

## 2010-12-22 DIAGNOSIS — M069 Rheumatoid arthritis, unspecified: Secondary | ICD-10-CM

## 2010-12-22 DIAGNOSIS — I10 Essential (primary) hypertension: Secondary | ICD-10-CM

## 2010-12-22 DIAGNOSIS — I480 Paroxysmal atrial fibrillation: Secondary | ICD-10-CM

## 2010-12-22 NOTE — Assessment & Plan Note (Signed)
Patient has not had any problems at the present time with her blood pressure.  She denies dizzy spells or syncope.  No chest pain.

## 2010-12-22 NOTE — Assessment & Plan Note (Signed)
No further atrial fibrillation since September 1.  On that emergency room visit she was also hypokalemic at 2.9 and this may have contributed to her arrhythmia.  She is now on potassium supplementation in the form of Klor-Con uncertain dose each day

## 2010-12-22 NOTE — Progress Notes (Signed)
[name] Date of Birth:  [birth year] [facility] / [facility] [address]   [address] [place], [place]  [address] [phone]           Fax   [phone]  History of Present Illness: This pleasant 65 year old woman is a seen for a scheduled followup office visit.  She has a history of paroxysmal atrial fibrillation.  She was hospitalized in February 2012 with new atrial fibrillation.  She is on Pradaxa.  She had a nuclear stress test on 06/23/10 which was negative for ischemia.  She had an echocardiogram on 05/27/10 which showed an ejection fraction of 65% and aortic valve sclerosis but no stenosis.  She has a history of rheumatoid arthritis and is on low dose chronic prednisone.  She has a history of hypercholesterolemia.  She was seen in the emergency room by Dr. Dietrich Pates on December 04, 2010 with paroxysmal atrial fibrillation and her metoprolol dose was doubled and she converted later that day.  Current Outpatient Prescriptions  Medication Sig Dispense Refill  . Abatacept (ORENCIA [place]) Inject into the skin once a week.        [name] atorvastatin (LIPITOR) 10 MG tablet Take 10 mg by mouth daily.        [name] buPROPion (WELLBUTRIN SR) 150 MG 12 hr tablet Take 150 mg by mouth 2 (two) times daily.        . Cholecalciferol (VITAMIN D) 400 UNITS capsule Take 400 Units by mouth daily.        [name] Conj Estrog-Medroxyprogest Ace (PREMPRO PO) Take 0.3 mg by mouth daily.        [name] diltiazem (CARDIZEM CD) 300 MG 24 hr capsule Take 1 capsule (300 mg total) by mouth daily.  30 capsule  11  . esomeprazole (NEXIUM) 40 MG capsule Take 40 mg by mouth daily.        [name] leflunomide (ARAVA) 10 MG tablet Take 10 mg by mouth daily.        . metFORMIN (GLUCOPHAGE-XR) 500 MG 24 hr tablet Take 500 mg by mouth 2 (two) times daily.        . metoprolol (TOPROL XL) 100 MG 24 hr tablet Take 1 tablet (100 mg total) by mouth 2 (two) times daily.  60 tablet  11  . nitroGLYCERIN (NITROSTAT) 0.4 MG SL tablet Place 0.4  mg under the tongue every 5 (five) minutes as needed.        . Omega-3 Fatty Acids (FISH OIL) 1000 MG CAPS Take by mouth daily.        . potassium chloride (KLOR-CON) 10 MEQ CR tablet Take 10 mEq by mouth daily.        [name] PRADAXA 150 MG CAPS TAKE ONE CAPSULE BY MOUTH EVERY 12 HOURS  60 capsule  5  . predniSONE (DELTASONE) 5 MG tablet Take 5 mg by mouth daily.        [name] triamterene-hydrochlorothiazide (DYAZIDE) 37.5-25 MG per capsule Take 1 capsule by mouth daily.        . vitamin B-12 (CYANOCOBALAMIN) 1000 MCG tablet Take 1,000 mcg by mouth daily.          No Known Allergies  Patient Active Problem List  Diagnoses  . Paroxysmal atrial fibrillation  . Hypertension  . Hypercholesterolemia  . Diabetes mellitus  . Rheumatoid arthritis  . Sinus tachycardia  . Chest pain, atypical    History  Smoking status  . Former Smoker  Smokeless tobacco  . Not on file  History  Alcohol Use: Not on file    No family history on file.  Review of Systems: Constitutional: no fever chills diaphoresis or fatigue or change in weight.  Head and neck: no hearing loss, no epistaxis, no photophobia or visual disturbance. Respiratory: No cough, shortness of breath or wheezing. Cardiovascular: No chest pain peripheral edema, palpitations. Gastrointestinal: No abdominal distention, no abdominal pain, no change in bowel habits hematochezia or melena. Genitourinary: No dysuria, no frequency, no urgency, no nocturia. Musculoskeletal:No arthralgias, no back pain, no gait disturbance or myalgias. Neurological: No dizziness, no headaches, no numbness, no seizures, no syncope, no weakness, no tremors. Hematologic: No lymphadenopathy, no easy bruising. Psychiatric: No confusion, no hallucinations, no sleep disturbance.    Physical Exam: Filed Vitals:   12/22/10 1633  BP: 120/70  Pulse: 66  The general appearance reveals a well-developed well-nourished woman in no distress.Pupils equal and reactive.    Extraocular Movements are full.  There is no scleral icterus.  The mouth and pharynx are normal.  The neck is supple.  The carotids reveal no bruits.  The jugular venous pressure is normal.  The thyroid is not enlarged.  There is no lymphadenopathy.  The chest is clear to percussion and auscultation. There are no rales or rhonchi. Expansion of the chest is symmetrical.    The heart reveals a soft systolic ejection murmur at the base.  The rhythm is regular with occasional premature atrial beats.The abdomen is soft and nontender. Bowel sounds are normal. The liver and spleen are not enlarged. There Are no abdominal masses. There are no bruits.  The pedal pulses are good.  There is no phlebitis or edema.  There is no cyanosis or clubbing.  Strength is normal and symmetrical in all extremities.  There is no lateralizing weakness.  There are no sensory deficits.  The skin is warm and dry.  There is no rash.    EKG shows normal sinus rhythm with premature atrial beats and no ischemic changes   Assessment / Plan: Continue present medication including potassium.  Recheck in 4 months for office visit and EKG

## 2010-12-22 NOTE — Assessment & Plan Note (Signed)
Patient is on chronic low dose prednisone for rheumatoid arthritis and this may have contributed to her hypokalemia and leukocytosis noted on her emergency room visit recently

## 2011-01-04 LAB — CBC
HCT: 31.8 — ABNORMAL LOW
HCT: 32.5 — ABNORMAL LOW
HCT: 43.5
Hemoglobin: 10.7 — ABNORMAL LOW
Hemoglobin: 11.1 — ABNORMAL LOW
Hemoglobin: 12.8
Hemoglobin: 14.6
MCHC: 34.2
MCV: 88.7
Platelets: 215
Platelets: 303
RBC: 3.65 — ABNORMAL LOW
RDW: 12.5
RDW: 12.8
RDW: 12.9
WBC: 10
WBC: 10.9 — ABNORMAL HIGH
WBC: 12.7 — ABNORMAL HIGH

## 2011-01-04 LAB — URINALYSIS, ROUTINE W REFLEX MICROSCOPIC
Glucose, UA: NEGATIVE
Hgb urine dipstick: NEGATIVE
Protein, ur: NEGATIVE
pH: 6

## 2011-01-04 LAB — PROTIME-INR
INR: 1.1
INR: 1.1
INR: 1.4
INR: 2.3 — ABNORMAL HIGH
INR: 2.5 — ABNORMAL HIGH
Prothrombin Time: 14.3
Prothrombin Time: 14.9
Prothrombin Time: 26.7 — ABNORMAL HIGH
Prothrombin Time: 28.3 — ABNORMAL HIGH
Prothrombin Time: 28.6 — ABNORMAL HIGH

## 2011-01-04 LAB — GLUCOSE, CAPILLARY
Glucose-Capillary: 113 — ABNORMAL HIGH
Glucose-Capillary: 121 — ABNORMAL HIGH
Glucose-Capillary: 135 — ABNORMAL HIGH
Glucose-Capillary: 136 — ABNORMAL HIGH
Glucose-Capillary: 144 — ABNORMAL HIGH
Glucose-Capillary: 149 — ABNORMAL HIGH
Glucose-Capillary: 151 — ABNORMAL HIGH
Glucose-Capillary: 153 — ABNORMAL HIGH
Glucose-Capillary: 165 — ABNORMAL HIGH
Glucose-Capillary: 166 — ABNORMAL HIGH
Glucose-Capillary: 170 — ABNORMAL HIGH

## 2011-01-04 LAB — COMPREHENSIVE METABOLIC PANEL
Alkaline Phosphatase: 72
BUN: 23
CO2: 26
Chloride: 102
Creatinine, Ser: 0.6
GFR calc non Af Amer: >60
Glucose, Bld: 117 — ABNORMAL HIGH
Potassium: 3.5
Total Bilirubin: 0.9

## 2011-01-04 LAB — BASIC METABOLIC PANEL
BUN: 15
CO2: 30
Calcium: 8.7
Calcium: 9
Chloride: 99
Creatinine, Ser: 0.54
GFR calc Af Amer: >60
GFR calc Af Amer: >60
GFR calc Af Amer: >60
GFR calc non Af Amer: >60
GFR calc non Af Amer: >60
Glucose, Bld: 164 — ABNORMAL HIGH
Glucose, Bld: 170 — ABNORMAL HIGH
Potassium: 4
Potassium: 4.3
Sodium: 138
Sodium: 140

## 2011-01-04 LAB — APTT: aPTT: 28

## 2011-01-04 LAB — TYPE AND SCREEN

## 2011-01-05 NOTE — Consult Note (Signed)
[name], ANDRINGA NO.:  [id number]  MEDICAL RECORD NO.:  [id number]  LOCATION:  [facility]                         FACILITY:  [facility]  PHYSICIAN:  Gerrit Friends. Dietrich Pates, MD, FACCDATE OF BIRTH:  [birth year]  DATE OF CONSULTATION:  12/04/2010 DATE OF DISCHARGE:  12/04/2010                                CONSULTATION   REFERRING PHYSICIAN:  Shelda Jakes, MD  PRIMARY CARE PROVIDER:  Mady Gemma, PA-C  PRIMARY CARDIOLOGIST:  Cassell Clement, MD  HISTORY OF PRESENT ILLNESS:  [name] is a very pleasant 65 year old woman who first developed atrial fibrillation in February of this year. She was briefly admitted to hospital, where heart rate was controlled with AV nodal blocking agents.  Prior to discharge, she converted spontaneously to sinus rhythm.  Anticoagulation with dabigatran was initiated in anticipation of future arrhythmic events.  She did well until the 2 or so days prior to admission when she again noted tachy palpitations.  She also experienced some left shoulder, left back and left chest discomfort that she attributes to arthritis.  There was no dyspnea, lightheadedness nor syncope.  She contacted her cardiologist's office and was advised to take an extra 300 mg of diltiazem.  Due to the persistence of palpitations, she sought evaluation in the emergency department, where she was found to have atrial fibrillation with a rapid ventricular response.  With administration of an intravenous 20 mg dose of diltiazem, heart rate slowed to approximately 100 bpm.  The patient has no known cardiovascular disease other than hypertension, which has been well-controlled with medical therapy.  ADDITIONAL MEDICAL PROBLEMS:  Diabetes treated with an oral agent, rheumatoid arthritis, anxiety, depression, hyperlipidemia, GERD and anemia.  PAST SURGICAL HISTORY:  Left TKA in 2009 from which she recovered well, cataract extraction, synovectomy to both hands in  2000, MCP implants in 1996 and 1997, excision of the mass from the left elbow and right hand and left THA in November 2009.  CURRENT CARDIAC MEDICATIONS: 1. Dabigatran 150 mg b.i.d. 2. Metoprolol succinate 100 mg daily. 3. Diltiazem 300 mg daily. 4. Triamterene/hydrochlorothiazide 1 daily. 5. She previously was treated with potassium, but has discontinued     that medicine at the request of one of her physicians.  ALLERGIES:  No known allergies.  She has developed GI upset in the past with aspirin and Vicodin.  FAMILY HISTORY:  Positive for diabetes, cardiac disease, hyperlipidemia, lung neoplasm and CVA.  SOCIAL HISTORY:  Prior cigarette smoking that was discontinued quite a few years ago.  She is divorced.  She denies use of alcohol or any over- the-counter medications.  REVIEW OF SYSTEMS:  Mild intermittent nonproductive cough; chronic joint discomfort related to rheumatoid arthritis.  Lifestyle is sedentary. She denies orthopnea, PND, lightheadedness or syncope.  She frequent frequently walks with a cane.  She experiences occasional headaches that she describes as sinus headaches.  She has been told of a hiatal hernia. She was jaundiced as a child.  She follows a diabetic diet at home.  She requires corrective lenses for near and far vision and has had a lens implant on the left.  She notes mild hearing impairment.  All other systems  reviewed and are negative.  PHYSICAL EXAMINATION:  GENERAL:  Pleasant well-appearing woman, in no acute distress. VITAL SIGNS:  The heart rate is 105 and irregular, respirations 14, blood pressure 150/60 and temperature 98.6. HEENT:  EOMs full; normal lids and conjunctivae; normal oral mucosa; teeth in good repair. NECK:  No jugular venous distention; normal carotid upstrokes without bruits. ENDOCRINE:  No thyromegaly. HEMATOPOIETIC:  No adenopathy. SKIN:  No significant lesions. PSYCHIATRIC:  Alert and oriented; normal affect. CARDIAC:   Irregular rhythm; normal first and second heart sounds. LUNGS:  Clear.  Resonant to percussion. ABDOMEN:  Soft and nontender; normal bowel sounds; no masses; no organomegaly. NEUROLOGIC:  Symmetric strength and tone; normal cranial nerves. EXTREMITIES:  Trace edema, slightly more prominent on the left; distal pulses intact.  INITIAL LABORATORY DATA:  Notable for a potassium of 2.9, glucose of 262, creatinine of 0.63, and a normal CBC except for an MCV of 73.  Chest x-ray shows mild hyperinflation of the lungs, mild cardiomegaly and by apical pleural thickening.  EKG:  Atrial fibrillation with a rapid ventricular response; ST-T wave abnormality representing possible inferior ischemia.  In comparison to previous tracing of May 27, 2010, atrial fibrillation has replaced sinus rhythm.  Inferior ST segments are new.  IMPRESSION:  [name] presents with essentially asymptomatic recurrent atrial fibrillation.  She is already anticoagulated.  Her initial echocardiogram showed no significant structural heart disease in February, and TSH was normal.  I see no benefit to hospital admission. Her medications for control of heart rate require adjustment. Metoprolol will be increased to 100 mg b.i.d.  Conversion back to sinus rhythm is likely within the next 24-48 hours.  She will follow up with Dr. Patty Sermons thereafter.  Potassium replacement has been initiated in hospital with a 100 mEq p.o. and IV.  I have advised her to resume oral potassium at a dose of 20 mEq daily and to have electrolytes checked when she returns to see Dr. Patty Sermons.     Gerrit Friends. Dietrich Pates, MD, [facility]     RMR/MEDQ  D:  12/04/2010  T:  12/05/2010  Job:  [id number]  Electronically Signed by [place] Bing MD [facility] on 01/05/2011 10:30:47 AM

## 2011-01-06 ENCOUNTER — Other Ambulatory Visit: Payer: Self-pay | Admitting: Rheumatology

## 2011-01-06 DIAGNOSIS — M542 Cervicalgia: Secondary | ICD-10-CM

## 2011-01-07 ENCOUNTER — Ambulatory Visit
Admission: RE | Admit: 2011-01-07 | Discharge: 2011-01-07 | Disposition: A | Discharge status: Home / Self Care | Payer: Medicare Other | Source: Ambulatory Visit | Attending: Rheumatology | Admitting: Rheumatology

## 2011-01-07 DIAGNOSIS — M542 Cervicalgia: Secondary | ICD-10-CM

## 2011-02-07 ENCOUNTER — Other Ambulatory Visit: Payer: Self-pay | Admitting: Otolaryngology

## 2011-02-07 DIAGNOSIS — E041 Nontoxic single thyroid nodule: Secondary | ICD-10-CM

## 2011-02-09 ENCOUNTER — Ambulatory Visit
Admission: RE | Admit: 2011-02-09 | Discharge: 2011-02-09 | Disposition: A | Discharge status: Home / Self Care | Payer: Medicare Other | Source: Ambulatory Visit | Attending: Otolaryngology | Admitting: Otolaryngology

## 2011-02-09 DIAGNOSIS — E041 Nontoxic single thyroid nodule: Secondary | ICD-10-CM

## 2011-04-07 ENCOUNTER — Encounter: Payer: Self-pay | Admitting: *Deleted

## 2011-04-07 DIAGNOSIS — I4821 Permanent atrial fibrillation: Secondary | ICD-10-CM | POA: Insufficient documentation

## 2011-04-07 DIAGNOSIS — F329 Major depressive disorder, single episode, unspecified: Secondary | ICD-10-CM | POA: Insufficient documentation

## 2011-04-07 DIAGNOSIS — R17 Unspecified jaundice: Secondary | ICD-10-CM | POA: Insufficient documentation

## 2011-04-07 DIAGNOSIS — K219 Gastro-esophageal reflux disease without esophagitis: Secondary | ICD-10-CM | POA: Insufficient documentation

## 2011-04-07 DIAGNOSIS — R079 Chest pain, unspecified: Secondary | ICD-10-CM | POA: Insufficient documentation

## 2011-04-07 DIAGNOSIS — K759 Inflammatory liver disease, unspecified: Secondary | ICD-10-CM | POA: Insufficient documentation

## 2011-04-11 ENCOUNTER — Encounter: Payer: Self-pay | Admitting: Cardiology

## 2011-04-11 ENCOUNTER — Ambulatory Visit (INDEPENDENT_AMBULATORY_CARE_PROVIDER_SITE_OTHER): Payer: Medicare Other | Admitting: Cardiology

## 2011-04-11 VITALS — BP 120/86 | HR 92 | Ht 67.0 in | Wt 185.0 lb

## 2011-04-11 DIAGNOSIS — I10 Essential (primary) hypertension: Secondary | ICD-10-CM

## 2011-04-11 DIAGNOSIS — M069 Rheumatoid arthritis, unspecified: Secondary | ICD-10-CM

## 2011-04-11 DIAGNOSIS — I4891 Unspecified atrial fibrillation: Secondary | ICD-10-CM

## 2011-04-11 DIAGNOSIS — I480 Paroxysmal atrial fibrillation: Secondary | ICD-10-CM

## 2011-04-11 DIAGNOSIS — E7800 Pure hypercholesterolemia, unspecified: Secondary | ICD-10-CM

## 2011-04-11 DIAGNOSIS — I119 Hypertensive heart disease without heart failure: Secondary | ICD-10-CM

## 2011-04-11 DIAGNOSIS — E119 Type 2 diabetes mellitus without complications: Secondary | ICD-10-CM

## 2011-04-11 NOTE — Assessment & Plan Note (Signed)
Blood pressure was remaining stable on current therapy.  No headaches dizziness or syncope. 

## 2011-04-11 NOTE — Patient Instructions (Signed)
Your physician has requested that you have an echocardiogram. Echocardiography is a painless test that uses sound waves to create images of your heart. It provides your doctor with information about the size and shape of your heart and how well your heart's chambers and valves are working. This procedure takes approximately one hour. There are no restrictions for this procedure.  Your physician recommends that you continue on your current medications as directed. Please refer to the Current Medication list given to you today. Your physician wants you to follow-up in: 4 months You will receive a reminder letter in the mail two months in advance. If you don't receive a letter, please call our office to schedule the follow-up appointment.  

## 2011-04-11 NOTE — Assessment & Plan Note (Signed)
The patient has a history of severe rheumatoid arthritis and is followed by Dr. Dareen Piano.  She is on long-term prednisone 5 mg daily as well as other immunosuppressive agents.

## 2011-04-11 NOTE — Assessment & Plan Note (Signed)
Patient has not had any hypoglycemic episodes from her diabetes.

## 2011-04-11 NOTE — Assessment & Plan Note (Signed)
The patient has not been aware of any fluttering or pounding of her heart.  Her electrocardiogram today does confirm atrial fibrillation with a controlled ventricular response.  She has not had any TIA symptoms.

## 2011-04-11 NOTE — Progress Notes (Signed)
[name] Date of Birth:  [birth year] [facility] [address] [place], [place]  [address] [phone]         Fax   [phone]  History of Present Illness: This pleasant 66 year old woman is seen for a scheduled followup office visit.  She has a history of atrial fibrillation and is on Pradaxa 150 mg twice a day.  She has not had any TIA symptoms.  Is not expressing any chest pain.  She's not having any symptoms of congestive heart failure.  He does not have any history of ischemic heart disease.  She had a normal stress test in March 2012.  Her last echocardiogram was done at the hospital on 05/27/10 and showed normal ejection fraction of 65%  Current Outpatient Prescriptions  Medication Sig Dispense Refill  . Abatacept (ORENCIA [place]) Inject into the skin once a week.        [name] atorvastatin (LIPITOR) 10 MG tablet Take 10 mg by mouth daily.        [name] buPROPion (WELLBUTRIN SR) 150 MG 12 hr tablet Take 150 mg by mouth 2 (two) times daily.        . Cholecalciferol (VITAMIN D) 400 UNITS capsule Take 400 Units by mouth daily.        [name] Conj Estrog-Medroxyprogest Ace (PREMPRO PO) Take 0.3 mg by mouth daily.        [name] diltiazem (CARDIZEM CD) 300 MG 24 hr capsule Take 1 capsule (300 mg total) by mouth daily.  30 capsule  11  . esomeprazole (NEXIUM) 40 MG capsule Take 40 mg by mouth daily.        [name] leflunomide (ARAVA) 10 MG tablet Take 10 mg by mouth daily.        . metFORMIN (GLUCOPHAGE-XR) 500 MG 24 hr tablet Take 500 mg by mouth 2 (two) times daily.        . metoprolol (TOPROL XL) 100 MG 24 hr tablet Take 1 tablet (100 mg total) by mouth 2 (two) times daily.  60 tablet  11  . nitroGLYCERIN (NITROSTAT) 0.4 MG SL tablet Place 0.4 mg under the tongue every 5 (five) minutes as needed.        . Omega-3 Fatty Acids (FISH OIL) 1000 MG CAPS Take by mouth daily.        . potassium chloride (KLOR-CON) 10 MEQ CR tablet Take 10 mEq by mouth daily.        [name] PRADAXA 150 MG CAPS TAKE ONE  CAPSULE BY MOUTH EVERY 12 HOURS  60 capsule  5  . predniSONE (DELTASONE) 5 MG tablet Take 5 mg by mouth daily.        [name] triamterene-hydrochlorothiazide (DYAZIDE) 37.5-25 MG per capsule Take 1 capsule by mouth daily.        . vitamin B-12 (CYANOCOBALAMIN) 1000 MCG tablet Take 1,000 mcg by mouth daily.          No Known Allergies  Patient Active Problem List  Diagnoses  . Paroxysmal atrial fibrillation  . Hypertension  . Hypercholesterolemia  . Diabetes mellitus  . Rheumatoid arthritis  . Sinus tachycardia  . Chest pain, atypical  . Chest pain  . Atrial fibrillation with RVR  . Anemia  . Anxiety and depression  . Gastroesophageal reflux disease  . Hepatitis  . Jaundice    History  Smoking status  . Former Smoker  Smokeless tobacco  . Not on file    History  Alcohol Use: Not on file  No family history on file.  Review of Systems: Constitutional: no fever chills diaphoresis or fatigue or change in weight.  Head and neck: no hearing loss, no epistaxis, no photophobia or visual disturbance. Respiratory: No cough, shortness of breath or wheezing. Cardiovascular: No chest pain peripheral edema, palpitations. Gastrointestinal: No abdominal distention, no abdominal pain, no change in bowel habits hematochezia or melena. Genitourinary: No dysuria, no frequency, no urgency, no nocturia. Musculoskeletal:No arthralgias, no back pain, no gait disturbance or myalgias. Neurological: No dizziness, no headaches, no numbness, no seizures, no syncope, no weakness, no tremors. Hematologic: No lymphadenopathy, no easy bruising. Psychiatric: No confusion, no hallucinations, no sleep disturbance.    Physical Exam: Filed Vitals:   04/11/11 1013  BP: 120/86  Pulse: 92   the general appearance reveals a well-developed well-nourished woman in no distress.Pupils equal and reactive.   Extraocular Movements are full.  There is no scleral icterus.  The mouth and pharynx are normal.  The  neck is supple.  The carotids reveal no bruits.  The jugular venous pressure is normal.  The thyroid is asymmetric with a small goiter present and this is being followed by her ENT physician Dr. Jenne Pane.  There is no lymphadenopathy.The chest is clear to percussion and auscultation. There are no rales or rhonchi. Expansion of the chest is symmetrical.  The precordium is quiet.  The first heart sound is normal.  The second heart sound is physiologically split.  There is no murmur gallop rub or click.  There is no abnormal lift or heave.  The rhythm is irregular in atrial fibrillation The abdomen is soft and nontender. Bowel sounds are normal. The liver and spleen are not enlarged. There Are no abdominal masses. There are no bruits.  The pedal pulses are good.  There is no phlebitis or edema.  There is no cyanosis or clubbing. Strength is normal and symmetrical in all extremities.  There is no lateralizing weakness.  There are no sensory deficits.  The skin is warm and dry.  There is no rash.  EKG shows atrial fibrillation with poor R-wave progression V1 through V3 and no ischemic changes.       Assessment / Plan: The patient complains of lack of energy.  This may be cardiac or noncardiac.  He states that her thyroid levels have been checked and that she is euthyroid.  We will update her echocardiogram prior to next visit.  Continue same medication.  Recheck in 4 months for followup office visit and electrocardiogram.

## 2011-04-14 ENCOUNTER — Ambulatory Visit (HOSPITAL_COMMUNITY): Payer: Medicare Other | Attending: Cardiology | Admitting: Radiology

## 2011-04-14 DIAGNOSIS — I4891 Unspecified atrial fibrillation: Secondary | ICD-10-CM | POA: Insufficient documentation

## 2011-04-14 DIAGNOSIS — I10 Essential (primary) hypertension: Secondary | ICD-10-CM | POA: Insufficient documentation

## 2011-04-14 DIAGNOSIS — E119 Type 2 diabetes mellitus without complications: Secondary | ICD-10-CM | POA: Insufficient documentation

## 2011-04-14 DIAGNOSIS — E785 Hyperlipidemia, unspecified: Secondary | ICD-10-CM | POA: Insufficient documentation

## 2011-04-14 DIAGNOSIS — I480 Paroxysmal atrial fibrillation: Secondary | ICD-10-CM

## 2011-04-15 ENCOUNTER — Telehealth: Payer: Self-pay | Admitting: *Deleted

## 2011-04-15 NOTE — Telephone Encounter (Signed)
Advise of echo results

## 2011-04-15 NOTE — Telephone Encounter (Signed)
Message copied by Burnell Blanks on Fri Apr 15, 2011  5:41 PM ------      Message from: Cassell Clement      Created: Thu Apr 14, 2011  2:30 PM       Please report.  The echo showed she was in atrial fib during the study.  The left ventricular function has improved further since last echo.  Continue pradaxa and other meds. Send a copy of echo to her PCP

## 2011-06-01 ENCOUNTER — Other Ambulatory Visit: Payer: Self-pay | Admitting: *Deleted

## 2011-06-01 MED ORDER — DABIGATRAN ETEXILATE MESYLATE 150 MG PO CAPS: 150.0000 mg | ORAL_CAPSULE | Freq: Two times a day (BID) | ORAL | Status: DC

## 2011-06-01 NOTE — Telephone Encounter (Signed)
Refilled pradaxa

## 2011-06-02 ENCOUNTER — Other Ambulatory Visit: Payer: Self-pay | Admitting: Cardiology

## 2011-06-02 NOTE — Telephone Encounter (Signed)
Refilled potassium

## 2011-06-24 ENCOUNTER — Other Ambulatory Visit: Payer: Self-pay | Admitting: Cardiology

## 2011-06-24 NOTE — Telephone Encounter (Signed)
Refilled triamterene

## 2011-06-30 ENCOUNTER — Other Ambulatory Visit: Payer: Self-pay | Admitting: Family Medicine

## 2011-06-30 DIAGNOSIS — Z1231 Encounter for screening mammogram for malignant neoplasm of breast: Secondary | ICD-10-CM

## 2011-07-02 ENCOUNTER — Encounter (HOSPITAL_BASED_OUTPATIENT_CLINIC_OR_DEPARTMENT_OTHER): Payer: Self-pay | Admitting: *Deleted

## 2011-07-02 ENCOUNTER — Emergency Department (HOSPITAL_BASED_OUTPATIENT_CLINIC_OR_DEPARTMENT_OTHER)
Admission: EM | Admit: 2011-07-02 | Discharge: 2011-07-02 | Disposition: A | Discharge status: Home / Self Care | Payer: Medicare Other | Attending: Emergency Medicine | Admitting: Emergency Medicine

## 2011-07-02 DIAGNOSIS — E119 Type 2 diabetes mellitus without complications: Secondary | ICD-10-CM | POA: Insufficient documentation

## 2011-07-02 DIAGNOSIS — R05 Cough: Secondary | ICD-10-CM | POA: Insufficient documentation

## 2011-07-02 DIAGNOSIS — K219 Gastro-esophageal reflux disease without esophagitis: Secondary | ICD-10-CM | POA: Insufficient documentation

## 2011-07-02 DIAGNOSIS — I4891 Unspecified atrial fibrillation: Secondary | ICD-10-CM | POA: Insufficient documentation

## 2011-07-02 DIAGNOSIS — J40 Bronchitis, not specified as acute or chronic: Secondary | ICD-10-CM

## 2011-07-02 DIAGNOSIS — Z96659 Presence of unspecified artificial knee joint: Secondary | ICD-10-CM | POA: Insufficient documentation

## 2011-07-02 DIAGNOSIS — I10 Essential (primary) hypertension: Secondary | ICD-10-CM | POA: Insufficient documentation

## 2011-07-02 DIAGNOSIS — R059 Cough, unspecified: Secondary | ICD-10-CM | POA: Insufficient documentation

## 2011-07-02 DIAGNOSIS — M069 Rheumatoid arthritis, unspecified: Secondary | ICD-10-CM | POA: Insufficient documentation

## 2011-07-02 MED ORDER — ALBUTEROL SULFATE HFA 108 (90 BASE) MCG/ACT IN AERS
2.0000 | INHALATION_SPRAY | RESPIRATORY_TRACT | Status: DC | PRN
  Administered 2011-07-02: 2 via RESPIRATORY_TRACT

## 2011-07-02 MED ORDER — ALBUTEROL SULFATE HFA 108 (90 BASE) MCG/ACT IN AERS
INHALATION_SPRAY | RESPIRATORY_TRACT | Status: AC
  Administered 2011-07-02: 2 via RESPIRATORY_TRACT
  Filled 2011-07-02: qty 6.7

## 2011-07-02 MED ORDER — HYDROCOD POLST-CHLORPHEN POLST 10-8 MG/5ML PO LQCR: 5.0000 mL | Freq: Two times a day (BID) | ORAL | Status: DC | PRN

## 2011-07-02 NOTE — ED Notes (Signed)
Pt reports cough x1 week. Evaluated by PCP on Wednesday with dx Sinusitis and given Amoxicillin.

## 2011-07-02 NOTE — Discharge Instructions (Signed)
Bronchitis Bronchitis is the body's way of reacting to injury and/or infection (inflammation) of the bronchi. Bronchi are the air tubes that extend from the windpipe into the lungs. If the inflammation becomes severe, it may cause shortness of breath. CAUSES  Inflammation may be caused by:  A virus.   Germs (bacteria).   Dust.   Allergens.   Pollutants and many other irritants.  The cells lining the bronchial tree are covered with tiny hairs (cilia). These constantly beat upward, away from the lungs, toward the mouth. This keeps the lungs free of pollutants. When these cells become too irritated and are unable to do their job, mucus begins to develop. This causes the characteristic cough of bronchitis. The cough clears the lungs when the cilia are unable to do their job. Without either of these protective mechanisms, the mucus would settle in the lungs. Then you would develop pneumonia. Smoking is a common cause of bronchitis and can contribute to pneumonia. Stopping this habit is the single most important thing you can do to help yourself. TREATMENT   Your caregiver may prescribe an antibiotic if the cough is caused by bacteria. Also, medicines that open up your airways make it easier to breathe. Your caregiver may also recommend or prescribe an expectorant. It will loosen the mucus to be coughed up. Only take over-the-counter or prescription medicines for pain, discomfort, or fever as directed by your caregiver.   Removing whatever causes the problem (smoking, for example) is critical to preventing the problem from getting worse.   Cough suppressants may be prescribed for relief of cough symptoms.   Inhaled medicines may be prescribed to help with symptoms now and to help prevent problems from returning.   For those with recurrent (chronic) bronchitis, there may be a need for steroid medicines.  SEEK IMMEDIATE MEDICAL CARE IF:   During treatment, you develop more pus-like mucus  (purulent sputum).   You become progressively more ill.   You have increased difficulty breathing, wheezing, or shortness of breath.   MAKE SURE YOU:   Understand these instructions.   Will watch your condition.   Will get help right away if you are not doing well or get worse.  Document Released: 03/21/2005 Document Revised: 03/10/2011 Document Reviewed: 01/29/2008 [employer] Patient Information 2012 [employer]. 

## 2011-07-02 NOTE — ED Provider Notes (Signed)
History     CSN: [id number]  Arrival date & time 07/02/11  0601   First MD Initiated Contact with Patient 07/02/11 0622      Chief Complaint  Patient presents with  . Cough    (Consider location/radiation/quality/duration/timing/severity/associated sxs/prior treatment) HPI This is a 66 year old white female cough for 4 days. She was seen by her primary care physician 3 days ago and started on amoxicillin. She continues to have a rattly cough that is moderate to severe. It is accompanied by some wheezing and mild dyspnea. She denies chest pain, fever, chills, sputum production, nausea, vomiting or diarrhea. She has not been taking anything for it besides the amoxicillin.  Past Medical History  Diagnosis Date  . Chest pain   . Atrial fibrillation with RVR     hx of  . Anemia     hx of  . Hypertension   . Diabetes mellitus   . Rheumatoid arthritis   . Anxiety and depression   . Gastroesophageal reflux disease   . Hyperlipidemia   . Hepatitis 1970  . Jaundice     age 89    Past Surgical History  Procedure Date  . Replacement total knee 2009    left  . Hand surgery 1995 and 1996    artificial joints both hands  . Cataract extraction     left    No family history on file.  History  Substance Use Topics  . Smoking status: Never Smoker   . Smokeless tobacco: Not on file  . Alcohol Use: No    OB History    Grav Para Term Preterm Abortions TAB SAB Ect Mult Living                  Review of Systems  All other systems reviewed and are negative.    Allergies  Review of patient's allergies indicates no known allergies.  Home Medications   Current Outpatient Rx  Name Route Sig Dispense Refill  . ORENCIA [place] Subcutaneous Inject into the skin once a week.      . ATORVASTATIN CALCIUM 10 MG PO TABS Oral Take 10 mg by mouth daily.      . BUPROPION HCL ER (SR) 150 MG PO TB12 Oral Take 150 mg by mouth 2 (two) times daily.      [name] VITAMIN D 400 UNITS PO CAPS Oral  Take 400 Units by mouth daily.      [name] PREMPRO PO Oral Take 0.3 mg by mouth daily.      [name] DABIGATRAN ETEXILATE MESYLATE 150 MG PO CAPS Oral Take 1 capsule (150 mg total) by mouth every 12 (twelve) hours. 60 capsule 5  . DILTIAZEM HCL ER COATED BEADS 300 MG PO CP24 Oral Take 1 capsule (300 mg total) by mouth daily. 30 capsule 11  . ESOMEPRAZOLE MAGNESIUM 40 MG PO CPDR Oral Take 40 mg by mouth daily.      [name] KLOR-CON 10 10 MEQ PO TBCR  TAKE 1 TABLET BY MOUTH EVERY DAY 30 tablet 5  . LEFLUNOMIDE 10 MG PO TABS Oral Take 10 mg by mouth daily.      [name] METFORMIN HCL ER 500 MG PO TB24 Oral Take 500 mg by mouth 2 (two) times daily.      [name] METOPROLOL SUCCINATE ER 100 MG PO TB24 Oral Take 1 tablet (100 mg total) by mouth 2 (two) times daily. 60 tablet 11  . NITROGLYCERIN 0.4 MG SL SUBL Sublingual Place 0.4 mg under the  tongue every 5 (five) minutes as needed.      [name] FISH OIL 1000 MG PO CAPS Oral Take by mouth daily.      [name] PREDNISONE 5 MG PO TABS Oral Take 5 mg by mouth daily.      . TRIAMTERENE-HCTZ 37.5-25 MG PO CAPS Oral Take 1 capsule by mouth daily.      . TRIAMTERENE-HCTZ 37.5-25 MG PO TABS  TAKE 1 TABLET EVERY DAY 30 tablet 11  . VITAMIN B-12 1000 MCG PO TABS Oral Take 1,000 mcg by mouth daily.        BP 143/63  Pulse 111  Temp(Src) 98.1 F (36.7 C) (Oral)  Resp 20  SpO2 96%  Physical Exam General: Well-developed, well-nourished female in no acute distress; appearance consistent with age of record HENT: normocephalic, atraumatic Eyes: pupils equal round and reactive to light; extraocular muscles intact Neck: supple Heart: Irregular rhythm, tachycardic Lungs: Faint wheezes on expiration; rattly cough Abdomen: soft; nondistended; nontender Extremities:  ulnar deviation of fingers consistent with rheumatoid arthritis;  decreased range of motion of fingers; no edema Neurologic: Awake, alert and oriented; motor function intact in all extremities and symmetric; no facial droop Skin: Warm and  dry Psychiatric: Normal mood and affect    ED Course  Procedures (including critical care time)    MDM          Hanley Seamen, MD 07/02/11 [phone]

## 2011-07-12 ENCOUNTER — Encounter: Payer: Self-pay | Admitting: Nurse Practitioner

## 2011-07-14 ENCOUNTER — Encounter: Payer: Self-pay | Admitting: Nurse Practitioner

## 2011-07-14 ENCOUNTER — Ambulatory Visit (INDEPENDENT_AMBULATORY_CARE_PROVIDER_SITE_OTHER): Payer: Medicare Other | Admitting: Nurse Practitioner

## 2011-07-14 VITALS — BP 138/80 | HR 85 | Ht 67.0 in | Wt 195.2 lb

## 2011-07-14 DIAGNOSIS — I4891 Unspecified atrial fibrillation: Secondary | ICD-10-CM

## 2011-07-14 LAB — BASIC METABOLIC PANEL
BUN: 20 mg/dL (ref 6–23)
CO2: 25 mEq/L (ref 19–32)
Calcium: 8.8 mg/dL (ref 8.4–10.5)
Chloride: 104 mEq/L (ref 96–112)
Creatinine, Ser: 0.7 mg/dL (ref 0.4–1.2)
GFR: 84.64 mL/min (ref >60.00)
Glucose, Bld: 117 mg/dL — ABNORMAL HIGH (ref 70–99)
Potassium: 3.4 mEq/L — ABNORMAL LOW (ref 3.5–5.1)
Sodium: 140 mEq/L (ref 135–145)

## 2011-07-14 LAB — TSH: TSH: 1.84 u[IU]/mL (ref 0.35–5.50)

## 2011-07-14 NOTE — Progress Notes (Signed)
[name] Date of Birth: 23-Feb-1946 Medical Record #[id number]  History of Present Illness: [name] is seen today for a work in visit. She is seen for Dr. Patty Sermons. She has a history of HTN, HLD and atrial fib. Her last echo in January showed her to be in atrial fib. She has been on chronic Pradaxa for over one year.   She comes in today. She is here alone. She has bronchitis and is on antibiotics and cough medicines. When she was at her PCP's office earlier this week, her heart rate was fast in the 140's. They called over here and she was told to take an extra Cardiazem. She only did this once because then she says her heart rate couldn't get above 70. She is totally asymptomatic. No dizziness. No chest pain or shortness of breath. She really has no awareness of her atrial fib.   Current Outpatient Prescriptions on File Prior to Visit  Medication Sig Dispense Refill  . Abatacept (ORENCIA [place]) Inject into the skin once a week.        [name] atorvastatin (LIPITOR) 10 MG tablet Take 10 mg by mouth daily.        [name] buPROPion (WELLBUTRIN SR) 150 MG 12 hr tablet Take 150 mg by mouth 2 (two) times daily.        . chlorpheniramine-HYDROcodone (TUSSIONEX PENNKINETIC ER) 10-8 MG/5ML LQCR Take 5 mLs by mouth every 12 (twelve) hours as needed (cough).  115 mL  0  . Cholecalciferol (VITAMIN D) 400 UNITS capsule Take 400 Units by mouth daily.        [name] Conj Estrog-Medroxyprogest Ace (PREMPRO PO) Take 0.3 mg by mouth daily.        . dabigatran (PRADAXA) 150 MG CAPS Take 1 capsule (150 mg total) by mouth every 12 (twelve) hours.  60 capsule  5  . diltiazem (CARDIZEM CD) 300 MG 24 hr capsule Take 1 capsule (300 mg total) by mouth daily.  30 capsule  11  . esomeprazole (NEXIUM) 40 MG capsule Take 40 mg by mouth daily.        [name] KLOR-CON 10 10 MEQ tablet TAKE 1 TABLET BY MOUTH EVERY DAY  30 tablet  5  . leflunomide (ARAVA) 10 MG tablet Take 10 mg by mouth daily.        . metFORMIN (GLUCOPHAGE-XR) 500 MG 24 hr  tablet Take 500 mg by mouth 2 (two) times daily.        . metoprolol (TOPROL XL) 100 MG 24 hr tablet Take 1 tablet (100 mg total) by mouth 2 (two) times daily.  60 tablet  11  . nitroGLYCERIN (NITROSTAT) 0.4 MG SL tablet Place 0.4 mg under the tongue every 5 (five) minutes as needed.        . Omega-3 Fatty Acids (FISH OIL) 1000 MG CAPS Take by mouth daily.        . predniSONE (DELTASONE) 5 MG tablet Take 5 mg by mouth daily.        [name] triamterene-hydrochlorothiazide (MAXZIDE-25) 37.5-25 MG per tablet TAKE 1 TABLET EVERY DAY  30 tablet  11  . vitamin B-12 (CYANOCOBALAMIN) 1000 MCG tablet Take 1,000 mcg by mouth daily.        [name] DISCONTD: triamterene-hydrochlorothiazide (DYAZIDE) 37.5-25 MG per capsule Take 1 capsule by mouth daily.          No Known Allergies  Past Medical History  Diagnosis Date  . Chest pain   . Atrial fibrillation   .  Anemia     hx of  . Hypertension   . Diabetes mellitus   . Rheumatoid arthritis   . Anxiety and depression   . Gastroesophageal reflux disease   . Hyperlipidemia   . Hepatitis 1970  . Jaundice     age 36    Past Surgical History  Procedure Date  . Replacement total knee 2009    left  . Hand surgery 1995 and 1996    artificial joints both hands  . Cataract extraction     left    History  Smoking status  . Never Smoker   Smokeless tobacco  . Not on file    History  Alcohol Use No    History reviewed. No pertinent family history.  Review of Systems: The review of systems is positive for bronchitis.  All other systems were reviewed and are negative.  Physical Exam: BP 138/80  Pulse 85  Ht 5\' 7"  (1.702 m)  Wt 195 lb 3.2 oz (88.542 kg)  BMI 30.57 kg/m2 Patient is very pleasant and in no acute distress. Skin is warm and dry. Color is normal.  HEENT is unremarkable. Normocephalic/atraumatic. PERRL. Sclera are nonicteric. Neck is supple. No masses. No JVD. Lungs are clear. Cardiac exam shows an irregular rhythm. Rate is controlled.  Abdomen is soft. Extremities are without edema. Gait and ROM are intact. No gross neurologic deficits noted.  LABORATORY DATA: EKG today shows atrial fib with a controlled ventricular response today with a rate of 83.    Assessment / Plan:

## 2011-07-14 NOTE — Assessment & Plan Note (Signed)
Her rate is now controlled. She was noted to be in atrial fib at the time of her echo this past January. She is not symptomatic. She is on chronic Pradaxa. I have left her on her current regimen. I do not think she needs extra Diltiazem at this time. I have suggested an extra half of Metoprolol if she notices an elevated heart rate. I suspect this was all related to her bronchitis which she is on medicine for. We will see her back as scheduled in May with Dr. Patty Sermons. We will check her potassium and TSH today to make sure nothing else is going on. Patient is agreeable to this plan and will call if any problems develop in the interim.

## 2011-07-14 NOTE — Patient Instructions (Addendum)
Stay on your current medicines  We are going to check some labs today  You may use extra 1/2 Metoprolol if your heart rate goes up  Keep your May 8 appointment with  Dr. Patty Sermons   Call the [facility] office at [phone] if you have any questions, problems or concerns.

## 2011-07-28 ENCOUNTER — Ambulatory Visit (INDEPENDENT_AMBULATORY_CARE_PROVIDER_SITE_OTHER): Payer: Medicare Other | Admitting: *Deleted

## 2011-07-28 ENCOUNTER — Telehealth: Payer: Self-pay | Admitting: *Deleted

## 2011-07-28 DIAGNOSIS — E876 Hypokalemia: Secondary | ICD-10-CM

## 2011-07-28 DIAGNOSIS — I10 Essential (primary) hypertension: Secondary | ICD-10-CM

## 2011-07-28 LAB — BASIC METABOLIC PANEL
CO2: 25 mEq/L (ref 19–32)
Chloride: 105 mEq/L (ref 96–112)
Creatinine, Ser: 0.7 mg/dL (ref 0.4–1.2)
Glucose, Bld: 152 mg/dL — ABNORMAL HIGH (ref 70–99)

## 2011-07-28 NOTE — Telephone Encounter (Signed)
Advised patient, she stated she was taking 10 meq twice a day and will increase to three times a day

## 2011-07-28 NOTE — Telephone Encounter (Signed)
Message copied by Burnell Blanks on Thu Jul 28, 2011  4:27 PM ------      Message from: Cassell Clement      Created: Thu Jul 28, 2011  1:49 PM       Please report.  The potassium is 3.2.  Increased potassium pills to one tablet 3 times a day.  Recheck a basal metabolic panel when she returns for office visit on May 8

## 2011-08-08 ENCOUNTER — Other Ambulatory Visit: Payer: Self-pay | Admitting: *Deleted

## 2011-08-08 MED ORDER — POTASSIUM CHLORIDE ER 10 MEQ PO TBCR: 10.0000 meq | EXTENDED_RELEASE_TABLET | Freq: Three times a day (TID) | ORAL | Status: DC

## 2011-08-08 NOTE — Telephone Encounter (Signed)
Refilled potassium and increased numbers to 90

## 2011-08-09 ENCOUNTER — Ambulatory Visit
Admission: RE | Admit: 2011-08-09 | Discharge: 2011-08-09 | Disposition: A | Discharge status: Home / Self Care | Payer: Medicare Other | Source: Ambulatory Visit | Attending: Family Medicine | Admitting: Family Medicine

## 2011-08-09 ENCOUNTER — Other Ambulatory Visit: Payer: Self-pay | Admitting: *Deleted

## 2011-08-09 DIAGNOSIS — Z1231 Encounter for screening mammogram for malignant neoplasm of breast: Secondary | ICD-10-CM

## 2011-08-10 ENCOUNTER — Encounter: Payer: Self-pay | Admitting: Cardiology

## 2011-08-10 ENCOUNTER — Ambulatory Visit (INDEPENDENT_AMBULATORY_CARE_PROVIDER_SITE_OTHER): Payer: Medicare Other | Admitting: Cardiology

## 2011-08-10 VITALS — BP 128/88 | HR 80 | Ht 67.0 in | Wt 192.0 lb

## 2011-08-10 DIAGNOSIS — I4891 Unspecified atrial fibrillation: Secondary | ICD-10-CM

## 2011-08-10 DIAGNOSIS — I480 Paroxysmal atrial fibrillation: Secondary | ICD-10-CM

## 2011-08-10 DIAGNOSIS — I119 Hypertensive heart disease without heart failure: Secondary | ICD-10-CM

## 2011-08-10 DIAGNOSIS — R0789 Other chest pain: Secondary | ICD-10-CM

## 2011-08-10 MED ORDER — DILTIAZEM HCL ER COATED BEADS 360 MG PO CP24: 360.0000 mg | ORAL_CAPSULE | Freq: Every day | ORAL | Status: DC

## 2011-08-10 NOTE — Progress Notes (Signed)
[name] Date of Birth:  [birth year] [facility] [address] [place], [place]  [address] [phone]         Fax   [phone]  History of Present Illness: This pleasant 66 year old woman is seen for a scheduled followup office visit.  She has a history of established atrial fibrillation.  She is on pradaxa 150 mg twice a day.  She has been feeling well.  She's not been expressing any chest pain.  She's had no TIA symptoms.  Her heart rate is fast at times.  She is doing some walking but does have some exertional dyspnea and no center heart rate increases easily  Current Outpatient Prescriptions  Medication Sig Dispense Refill  . Abatacept (ORENCIA [place]) Inject into the skin once a week.        [name] atorvastatin (LIPITOR) 10 MG tablet Take 10 mg by mouth daily.        [name] buPROPion (WELLBUTRIN SR) 150 MG 12 hr tablet Take 150 mg by mouth 2 (two) times daily.        . Cholecalciferol (VITAMIN D) 400 UNITS capsule Take 400 Units by mouth daily.        [name] Conj Estrog-Medroxyprogest Ace (PREMPRO PO) Take 0.3 mg by mouth daily.        . dabigatran (PRADAXA) 150 MG CAPS Take 1 capsule (150 mg total) by mouth every 12 (twelve) hours.  60 capsule  5  . diltiazem (CARDIZEM CD) 360 MG 24 hr capsule Take 1 capsule (360 mg total) by mouth daily.  30 capsule  11  . esomeprazole (NEXIUM) 40 MG capsule Take 40 mg by mouth daily.        [name] leflunomide (ARAVA) 10 MG tablet Take 10 mg by mouth daily.        . metFORMIN (GLUCOPHAGE-XR) 500 MG 24 hr tablet Take 500 mg by mouth 2 (two) times daily.        . metoprolol (TOPROL XL) 100 MG 24 hr tablet Take 1 tablet (100 mg total) by mouth 2 (two) times daily.  60 tablet  11  . nitroGLYCERIN (NITROSTAT) 0.4 MG SL tablet Place 0.4 mg under the tongue every 5 (five) minutes as needed.        . Omega-3 Fatty Acids (FISH OIL) 1000 MG CAPS Take by mouth daily.        . potassium chloride (KLOR-CON 10) 10 MEQ tablet Take 1 tablet (10 mEq total) by  mouth 3 (three) times daily.  90 tablet  11  . predniSONE (DELTASONE) 5 MG tablet Take 5 mg by mouth daily.        [name] triamterene-hydrochlorothiazide (MAXZIDE-25) 37.5-25 MG per tablet TAKE 1 TABLET EVERY DAY  30 tablet  11  . vitamin B-12 (CYANOCOBALAMIN) 1000 MCG tablet Take 1,000 mcg by mouth daily.        [name] DISCONTD: diltiazem (CARDIZEM CD) 300 MG 24 hr capsule Take 1 capsule (300 mg total) by mouth daily.  30 capsule  11    No Known Allergies  Patient Active Problem List  Diagnoses  . Paroxysmal atrial fibrillation  . Hypertension  . Hypercholesterolemia  . Diabetes mellitus  . Rheumatoid arthritis  . Sinus tachycardia  . Chest pain, atypical  . Chest pain  . Atrial fibrillation with RVR  . Anemia  . Anxiety and depression  . Gastroesophageal reflux disease  . Hepatitis  . Jaundice    History  Smoking status  . Never Smoker  Smokeless tobacco  . Not on file    History  Alcohol Use No    No family history on file.  Review of Systems: Constitutional: no fever chills diaphoresis or fatigue or change in weight.  Head and neck: no hearing loss, no epistaxis, no photophobia or visual disturbance. Respiratory: No cough, shortness of breath or wheezing. Cardiovascular: No chest pain peripheral edema, palpitations. Gastrointestinal: No abdominal distention, no abdominal pain, no change in bowel habits hematochezia or melena. Genitourinary: No dysuria, no frequency, no urgency, no nocturia. Musculoskeletal:No arthralgias, no back pain, no gait disturbance or myalgias. Neurological: No dizziness, no headaches, no numbness, no seizures, no syncope, no weakness, no tremors. Hematologic: No lymphadenopathy, no easy bruising. Psychiatric: No confusion, no hallucinations, no sleep disturbance.    Physical Exam: Filed Vitals:   08/10/11 0906  BP: 128/88  Pulse: 80   the general appearance reveals a well-developed well-nourished woman in no distress.  Her heart rate when  I checked it was 114 at rest.Pupils equal and reactive.   Extraocular Movements are full.  There is no scleral icterus.  The mouth and pharynx are normal.  The neck is supple.  The carotids reveal no bruits.  The jugular venous pressure is normal.  The thyroid is not enlarged.  There is no lymphadenopathy.  The chest is clear to percussion and auscultation. There are no rales or rhonchi. Expansion of the chest is symmetrical.  The precordium is quiet.  The first heart sound is normal.  The second heart sound is physiologically split.  There is no murmur gallop rub or click.  There is no abnormal lift or heave.  Rhythm is rapid and irregular.The abdomen is soft and nontender. Bowel sounds are normal. The liver and spleen are not enlarged. There Are no abdominal masses. There are no bruits.  Normal extremity Strength is normal and symmetrical in all extremities.  There is no lateralizing weakness.  There are no sensory deficits.      Assessment / Plan: The same medication except for the increase in Cardizem CD 360 milligrams daily.  Recheck in 3 months for followup office visit and EKG

## 2011-08-10 NOTE — Assessment & Plan Note (Signed)
Patient has had atrial fibrillation with a rapid ventricular response.  She is already on beta blocker and diltiazem.  We will plan to increase her diltiazem to 360 mg sustained release daily for better heart rate control with exertion

## 2011-08-10 NOTE — Patient Instructions (Signed)
INCREASE YOUR CARDIZEM TO 360 MG DAILY, NEW RX HAS BEEN SENT TO [facility]  Your physician recommends that you schedule a follow-up appointment in: 3 month ov and ekg

## 2011-08-10 NOTE — Assessment & Plan Note (Signed)
Patient has not been experiencing any chest pain to suggest angina pectoris.  Does not have any history of ischemic heart disease and she had a normal stress test in March 2012.  Her ejection fraction at the time of her echocardiogram February 2012 was 65%

## 2011-08-11 ENCOUNTER — Other Ambulatory Visit: Payer: Self-pay | Admitting: Family Medicine

## 2011-08-11 DIAGNOSIS — R928 Other abnormal and inconclusive findings on diagnostic imaging of breast: Secondary | ICD-10-CM

## 2011-08-15 ENCOUNTER — Ambulatory Visit
Admission: RE | Admit: 2011-08-15 | Discharge: 2011-08-15 | Disposition: A | Discharge status: Home / Self Care | Payer: Medicare Other | Source: Ambulatory Visit | Attending: Family Medicine | Admitting: Family Medicine

## 2011-08-15 DIAGNOSIS — R928 Other abnormal and inconclusive findings on diagnostic imaging of breast: Secondary | ICD-10-CM

## 2011-10-28 ENCOUNTER — Encounter (INDEPENDENT_AMBULATORY_CARE_PROVIDER_SITE_OTHER): Payer: Medicare Other | Admitting: Ophthalmology

## 2011-10-31 ENCOUNTER — Encounter (INDEPENDENT_AMBULATORY_CARE_PROVIDER_SITE_OTHER): Payer: Medicare Other | Admitting: Ophthalmology

## 2011-11-04 ENCOUNTER — Encounter (INDEPENDENT_AMBULATORY_CARE_PROVIDER_SITE_OTHER): Payer: Medicare Other | Admitting: Ophthalmology

## 2011-11-04 DIAGNOSIS — I10 Essential (primary) hypertension: Secondary | ICD-10-CM

## 2011-11-04 DIAGNOSIS — E11319 Type 2 diabetes mellitus with unspecified diabetic retinopathy without macular edema: Secondary | ICD-10-CM

## 2011-11-04 DIAGNOSIS — H3530 Unspecified macular degeneration: Secondary | ICD-10-CM

## 2011-11-04 DIAGNOSIS — H2510 Age-related nuclear cataract, unspecified eye: Secondary | ICD-10-CM

## 2011-11-04 DIAGNOSIS — H35039 Hypertensive retinopathy, unspecified eye: Secondary | ICD-10-CM

## 2011-11-04 DIAGNOSIS — E1165 Type 2 diabetes mellitus with hyperglycemia: Secondary | ICD-10-CM

## 2011-11-04 DIAGNOSIS — H43819 Vitreous degeneration, unspecified eye: Secondary | ICD-10-CM

## 2011-11-07 ENCOUNTER — Ambulatory Visit (INDEPENDENT_AMBULATORY_CARE_PROVIDER_SITE_OTHER): Payer: Medicare Other | Admitting: Cardiology

## 2011-11-07 ENCOUNTER — Encounter: Payer: Self-pay | Admitting: Cardiology

## 2011-11-07 VITALS — BP 128/80 | HR 127 | Ht 67.0 in | Wt 196.0 lb

## 2011-11-07 DIAGNOSIS — I4891 Unspecified atrial fibrillation: Secondary | ICD-10-CM

## 2011-11-07 DIAGNOSIS — I480 Paroxysmal atrial fibrillation: Secondary | ICD-10-CM

## 2011-11-07 MED ORDER — DIGOXIN 125 MCG PO TABS: 0.1250 mg | ORAL_TABLET | Freq: Every day | ORAL | Status: DC

## 2011-11-07 NOTE — Assessment & Plan Note (Signed)
The patient has not been expressing any chest pain or angina. 

## 2011-11-07 NOTE — Patient Instructions (Signed)
Add digoxin 0.125 mg daily, Rx sent to [facility]  Your physician recommends that you schedule a follow-up appointment in: 1 month ov/bmet/dig level/ekg with  Dr. Patty Sermons or Dawayne Patricia. NP

## 2011-11-07 NOTE — Assessment & Plan Note (Signed)
Patient has had no TIA symptoms.  She is on Pradaxa

## 2011-11-07 NOTE — Progress Notes (Signed)
[name] Date of Birth:  [birth year] [facility] [address] [place], [place]  [address] [phone]  Fax   [phone]  HPI: This pleasant 66 year old woman is seen for a scheduled followup office visit.  She has a history of established atrial fibrillation.  She had an echocardiogram in January 2013 which showed normal ejection fraction she has not been experiencing any chest pain.  Does have some exertional dyspnea.  She has had some mild peripheral edema she has had inadequate rate control.  Today his EKG shows a ventricular response of 127 at rest.  Current Outpatient Prescriptions  Medication Sig Dispense Refill  . Abatacept (ORENCIA [place]) Inject into the skin once a week.        [name] atorvastatin (LIPITOR) 10 MG tablet Take 10 mg by mouth daily.        [name] buPROPion (WELLBUTRIN SR) 150 MG 12 hr tablet Take 150 mg by mouth 2 (two) times daily.        . Cholecalciferol (VITAMIN D) 400 UNITS capsule Take 400 Units by mouth daily.        [name] Conj Estrog-Medroxyprogest Ace (PREMPRO PO) Take 0.3 mg by mouth daily.        . dabigatran (PRADAXA) 150 MG CAPS Take 1 capsule (150 mg total) by mouth every 12 (twelve) hours.  60 capsule  5  . diltiazem (CARDIZEM CD) 360 MG 24 hr capsule Take 1 capsule (360 mg total) by mouth daily.  30 capsule  11  . esomeprazole (NEXIUM) 40 MG capsule Take 40 mg by mouth daily.        [name] leflunomide (ARAVA) 10 MG tablet Take 10 mg by mouth daily.        . metFORMIN (GLUCOPHAGE-XR) 500 MG 24 hr tablet Take 500 mg by mouth as directed. Taking 2 daily in the evening      . metoprolol (TOPROL XL) 100 MG 24 hr tablet Take 1 tablet (100 mg total) by mouth 2 (two) times daily.  60 tablet  11  . nitroGLYCERIN (NITROSTAT) 0.4 MG SL tablet Place 0.4 mg under the tongue every 5 (five) minutes as needed.        . Omega-3 Fatty Acids (FISH OIL) 1000 MG CAPS Take by mouth daily.        . potassium chloride (KLOR-CON 10) 10 MEQ tablet Take 1 tablet (10 mEq  total) by mouth 3 (three) times daily.  90 tablet  11  . predniSONE (DELTASONE) 5 MG tablet Take 5 mg by mouth daily.        [name] triamterene-hydrochlorothiazide (MAXZIDE-25) 37.5-25 MG per tablet TAKE 1 TABLET EVERY DAY  30 tablet  11  . vitamin B-12 (CYANOCOBALAMIN) 1000 MCG tablet Take 1,000 mcg by mouth daily.        . digoxin (LANOXIN) 0.125 MG tablet Take 1 tablet (0.125 mg total) by mouth daily.  30 tablet  11    No Known Allergies  Patient Active Problem List  Diagnosis  . Paroxysmal atrial fibrillation  . Hypertension  . Hypercholesterolemia  . Diabetes mellitus  . Rheumatoid arthritis  . Sinus tachycardia  . Chest pain, atypical  . Chest pain  . Atrial fibrillation with RVR  . Anemia  . Anxiety and depression  . Gastroesophageal reflux disease  . Hepatitis  . Jaundice    History  Smoking status  . Never Smoker   Smokeless tobacco  . Not on file    History  Alcohol Use No  No family history on file.  Review of Systems: The patient denies any heat or cold intolerance.  No weight gain or weight loss.  The patient denies headaches or blurry vision.  There is no cough or sputum production.  The patient denies dizziness.  There is no hematuria or hematochezia.  The patient denies any muscle aches or arthritis.  The patient denies any rash.  The patient denies frequent falling or instability.  There is no history of depression or anxiety.  All other systems were reviewed and are negative.   Physical Exam: Filed Vitals:   11/07/11 0920  BP: 128/80  Pulse: 127   General appearance reveals a well-developed well-nourished woman in no distress.The head and neck exam reveals pupils equal and reactive.  Extraocular movements are full.  There is no scleral icterus.  The mouth and pharynx are normal.  The neck is supple.  The carotids reveal no bruits.  The jugular venous pressure is normal.  The  thyroid is not enlarged.  There is no lymphadenopathy.  The chest is clear to  percussion and auscultation.  There are no rales or rhonchi.  Expansion of the chest is symmetrical.  The precordium is quiet.  The first heart sound is normal.  The second heart sound is physiologically split.  There is no murmur gallop rub or click.  There is no abnormal lift or heave.  The abdomen is soft and nontender.  The bowel sounds are normal.  The liver and spleen are not enlarged.  There are no abdominal masses.  There are no abdominal bruits.  Extremities reveal good pedal pulses.  There is no phlebitis and there is mild edema.  There is no cyanosis or clubbing.  Strength is normal and symmetrical in all extremities.  There is no lateralizing weakness.  There are no sensory deficits.  The skin is warm and dry.  There is no rash.  EKG shows atrial fibrillation with a rapid ventricular response.  Assessment / Plan:  We are going to digoxin 0.125 mg one daily to her regimen.  This is for additional rate control.  She is already on large doses of diltiazem and beta blocker.  Recheck in one month for followup office visit EKG basal metabolic panel and digoxin level.

## 2011-11-22 ENCOUNTER — Other Ambulatory Visit: Payer: Self-pay | Admitting: Cardiology

## 2011-12-09 ENCOUNTER — Encounter: Payer: Self-pay | Admitting: Nurse Practitioner

## 2011-12-09 ENCOUNTER — Ambulatory Visit (INDEPENDENT_AMBULATORY_CARE_PROVIDER_SITE_OTHER): Payer: Medicare Other | Admitting: Nurse Practitioner

## 2011-12-09 ENCOUNTER — Other Ambulatory Visit (INDEPENDENT_AMBULATORY_CARE_PROVIDER_SITE_OTHER): Payer: Medicare Other

## 2011-12-09 ENCOUNTER — Telehealth: Payer: Self-pay | Admitting: *Deleted

## 2011-12-09 VITALS — BP 136/70 | HR 92 | Ht 67.0 in | Wt 198.8 lb

## 2011-12-09 DIAGNOSIS — R0989 Other specified symptoms and signs involving the circulatory and respiratory systems: Secondary | ICD-10-CM

## 2011-12-09 DIAGNOSIS — E876 Hypokalemia: Secondary | ICD-10-CM

## 2011-12-09 DIAGNOSIS — I4891 Unspecified atrial fibrillation: Secondary | ICD-10-CM

## 2011-12-09 LAB — BASIC METABOLIC PANEL
BUN: 19 mg/dL (ref 6–23)
Chloride: 103 mEq/L (ref 96–112)
Creatinine, Ser: 0.9 mg/dL (ref 0.4–1.2)
GFR: 69.97 mL/min (ref >60.00)
Glucose, Bld: 196 mg/dL — ABNORMAL HIGH (ref 70–99)
Potassium: 3.8 mEq/L (ref 3.5–5.1)

## 2011-12-09 MED ORDER — NITROGLYCERIN 0.4 MG SL SUBL: 0.4000 mg | SUBLINGUAL_TABLET | SUBLINGUAL | Status: DC | PRN

## 2011-12-09 MED ORDER — FUROSEMIDE 40 MG PO TABS: 40.0000 mg | ORAL_TABLET | Freq: Every day | ORAL | Status: DC

## 2011-12-09 NOTE — Progress Notes (Signed)
Quick Note:  Please report to patient. The recent labs are stable. Continue same medication and careful diet. The potassium is normal. Continue present medication. The blood sugar is higher at 196. Continue strict diabetic diet. ______

## 2011-12-09 NOTE — Patient Instructions (Addendum)
Stop the Maxzide  Start Lasix 40 mg each morning. This should help with the swelling. This prescription is at the drug store  I have refilled the NTG.   Keep monitoring your blood pressure at home  We are checking labs today  I would like to see you in 4 weeks.  Avoid salt  Call the [facility] office at [phone] if you have any questions, problems or concerns.

## 2011-12-09 NOTE — Telephone Encounter (Signed)
Message copied by Burnell Blanks on Fri Dec 09, 2011  5:41 PM ------      Message from: Cassell Clement      Created: Fri Dec 09, 2011  5:13 PM       Please report to patient.  The recent labs are stable. Continue same medication and careful diet.  The potassium is normal.  Continue present medication.      The blood sugar is higher at 196.  Continue strict diabetic diet.

## 2011-12-09 NOTE — Telephone Encounter (Signed)
Advised patient of lab results  

## 2011-12-09 NOTE — Progress Notes (Signed)
[name] Date of Birth: 1945-07-18 Medical Record #[id number]  History of Present Illness: [name] is seen back today for a one month check. She is seen for Dr. Patty Sermons. She has HTN, DM, obesity, HLD and atrial fib. She is on chronic anticoagulation. Last echo was in January of 2013 with normal EF. Digoxin was added at her last visit due to increased heart rate.   She comes in today. She is here alone. She says she is doing ok. She notes that her heart rate is staying below a 100 at home. BP is ok. She is having more swelling that is not going down overnight. No chest pain. Not short of breath. Sleeps poorly. Not exercising and is limited by her neuropathy. Wants to lose weight but does not know how to go about it. Admits that she eats in response to stress and boredom. Admits that she might be a little depressed.   Current Outpatient Prescriptions on File Prior to Visit  Medication Sig Dispense Refill  . Abatacept (ORENCIA [place]) Inject into the skin once a week.        [name] atorvastatin (LIPITOR) 10 MG tablet Take 10 mg by mouth daily.        [name] buPROPion (WELLBUTRIN SR) 150 MG 12 hr tablet Take 150 mg by mouth 2 (two) times daily.        . Cholecalciferol (VITAMIN D) 400 UNITS capsule Take 400 Units by mouth daily.        [name] Conj Estrog-Medroxyprogest Ace (PREMPRO PO) Take 0.3 mg by mouth daily.        . dabigatran (PRADAXA) 150 MG CAPS Take 1 capsule (150 mg total) by mouth every 12 (twelve) hours.  60 capsule  5  . digoxin (LANOXIN) 0.125 MG tablet Take 1 tablet (0.125 mg total) by mouth daily.  30 tablet  11  . diltiazem (CARDIZEM CD) 360 MG 24 hr capsule Take 1 capsule (360 mg total) by mouth daily.  30 capsule  11  . esomeprazole (NEXIUM) 40 MG capsule Take 40 mg by mouth daily.        [name] leflunomide (ARAVA) 10 MG tablet Take 10 mg by mouth daily.        . metFORMIN (GLUCOPHAGE-XR) 500 MG 24 hr tablet Take 500 mg by mouth as directed. Taking 2 daily in the evening      . metoprolol  (TOPROL XL) 100 MG 24 hr tablet Take 1 tablet (100 mg total) by mouth 2 (two) times daily.  60 tablet  11  . Omega-3 Fatty Acids (FISH OIL) 1000 MG CAPS Take by mouth daily.        . potassium chloride (KLOR-CON 10) 10 MEQ tablet Take 1 tablet (10 mEq total) by mouth 3 (three) times daily.  90 tablet  11  . predniSONE (DELTASONE) 5 MG tablet Take 5 mg by mouth daily.        . vitamin B-12 (CYANOCOBALAMIN) 1000 MCG tablet Take 1,000 mcg by mouth daily.        [name] DISCONTD: nitroGLYCERIN (NITROSTAT) 0.4 MG SL tablet Place 0.4 mg under the tongue every 5 (five) minutes as needed.        . furosemide (LASIX) 40 MG tablet Take 1 tablet (40 mg total) by mouth daily.  30 tablet  11  . DISCONTD: metoprolol succinate (TOPROL-XL) 100 MG 24 hr tablet TAKE 1 TABLET BY MOUTH TWICE A DAY  60 tablet  11    No Known Allergies  Past Medical History  Diagnosis Date  . Chest pain   . Atrial fibrillation   . Anemia     hx of  . Hypertension   . Diabetes mellitus   . Rheumatoid arthritis   . Anxiety and depression   . Gastroesophageal reflux disease   . Hyperlipidemia   . Hepatitis 1970  . Jaundice     age 27    Past Surgical History  Procedure Date  . Replacement total knee 2009    left  . Hand surgery 1995 and 1996    artificial joints both hands  . Cataract extraction     left    History  Smoking status  . Never Smoker   Smokeless tobacco  . Not on file    History  Alcohol Use No    History reviewed. No pertinent family history.  Review of Systems: The review of systems is per the HPI.  All other systems were reviewed and are negative.  Physical Exam: BP 136/70  Pulse 92  Ht 5\' 7"  (1.702 m)  Wt 198 lb 12 oz (90.152 kg)  BMI 31.13 kg/m2 Patient is very pleasant and in no acute distress. Skin is warm and dry. Color is normal.  HEENT is unremarkable. Normocephalic/atraumatic. PERRL. Sclera are nonicteric. Neck is supple. No masses. No JVD. Lungs are clear. Cardiac exam shows a  regular rate and rhythm. Abdomen is soft. Extremities are with 1+ ankle/pedal edema. Gait and ROM are intact. No gross neurologic deficits noted.  LABORATORY DATA:  EKG today shows atrial fib with a more controlled ventricular response today. Rate is 92.   Labs are pending for today.   Echo Study Conclusions from January 2013  Left ventricle: The cavity size was normal. There was mild concentric hypertrophy. Systolic function was vigorous. The estimated ejection fraction was in the range of 65% to 70%. Wall motion was normal; there were no regional wall motion abnormalities.    Assessment / Plan:  1. Atrial fib - now on digoxin. Her rate is better. We are checking labs today.   2. HTN - BP is ok.   3. Edema - she really notes no response with her Maxzide. Will stop the Maxzide. Start Lasix 40 mg a day. Salt restriction is encouraged.  I will see her back in a month and check a BMET on return.   4. Obesity - we had a nice discussion regarding weight loss and exercise. She is going to try and join the [employer] or get a stationary bike. I do think this will help with her depressive symptoms and she agrees as well.   5. Chronic anticoagulation with Pradaxa - no adverse effects noted.  I will see her back in one month. Patient is agreeable to this plan and will call if any problems develop in the interim.

## 2011-12-10 LAB — DIGOXIN LEVEL: Digoxin Level: 1 ng/mL (ref 0.8–2.0)

## 2011-12-12 ENCOUNTER — Telehealth: Payer: Self-pay | Admitting: *Deleted

## 2011-12-12 NOTE — Telephone Encounter (Signed)
Pt notified of lab results.  Amanda Becker, CMA 

## 2011-12-12 NOTE — Telephone Encounter (Signed)
Message copied by Awilda Bill on Mon Dec 12, 2011 12:24 PM ------      Message from: Rosalio Macadamia      Created: Sat Dec 10, 2011 10:05 AM       Ok to report. Dig level is ok. Waiting on BMET

## 2011-12-13 ENCOUNTER — Other Ambulatory Visit: Payer: Self-pay | Admitting: Cardiology

## 2011-12-28 ENCOUNTER — Other Ambulatory Visit: Payer: Self-pay | Admitting: Gastroenterology

## 2011-12-28 DIAGNOSIS — R1310 Dysphagia, unspecified: Secondary | ICD-10-CM

## 2012-01-04 ENCOUNTER — Ambulatory Visit
Admission: RE | Admit: 2012-01-04 | Discharge: 2012-01-04 | Disposition: A | Discharge status: Home / Self Care | Payer: Medicare Other | Source: Ambulatory Visit | Attending: Gastroenterology | Admitting: Gastroenterology

## 2012-01-04 DIAGNOSIS — R1310 Dysphagia, unspecified: Secondary | ICD-10-CM

## 2012-01-06 ENCOUNTER — Ambulatory Visit (INDEPENDENT_AMBULATORY_CARE_PROVIDER_SITE_OTHER): Payer: Medicare Other | Admitting: Nurse Practitioner

## 2012-01-06 ENCOUNTER — Encounter: Payer: Self-pay | Admitting: Nurse Practitioner

## 2012-01-06 ENCOUNTER — Telehealth: Payer: Self-pay

## 2012-01-06 VITALS — BP 136/84 | HR 60 | Ht 67.0 in | Wt 193.8 lb

## 2012-01-06 DIAGNOSIS — R609 Edema, unspecified: Secondary | ICD-10-CM

## 2012-01-06 DIAGNOSIS — I10 Essential (primary) hypertension: Secondary | ICD-10-CM

## 2012-01-06 LAB — BASIC METABOLIC PANEL
BUN: 17 mg/dL (ref 6–23)
CO2: 26 mEq/L (ref 19–32)
Calcium: 9 mg/dL (ref 8.4–10.5)
Chloride: 101 mEq/L (ref 96–112)
Creatinine, Ser: 0.7 mg/dL (ref 0.4–1.2)
GFR: 88.71 mL/min (ref >60.00)
Glucose, Bld: 224 mg/dL — ABNORMAL HIGH (ref 70–99)
Potassium: 3.4 mEq/L — ABNORMAL LOW (ref 3.5–5.1)
Sodium: 138 mEq/L (ref 135–145)

## 2012-01-06 MED ORDER — POTASSIUM CHLORIDE ER 10 MEQ PO TBCR: 20.0000 meq | EXTENDED_RELEASE_TABLET | Freq: Two times a day (BID) | ORAL | Status: DC

## 2012-01-06 NOTE — Telephone Encounter (Signed)
**Note De-Identified  Obfuscation** Pt. Advised, she verbalized understanding. Lab scheduled to be drawn on 10/18. /LV

## 2012-01-06 NOTE — Progress Notes (Signed)
[name] Date of Birth: 1945/07/11 Medical Record #[id number]  History of Present Illness: [name] is seen back today for a 4 week check. She is seen for Dr. Patty Sermons. She has HTN, DM, obesity, HLD and atrial fib. She is on chronic anticoagulation. Last echo was in January of 2013 and showed a normal EF. Digoxin was added back in the summer due to increased heart rate. I saw her a month ago and she was having more swelling. We switched her diuretic to Lasix.  She comes in today. She is here alone. She is down 5 pounds. She feels great. Not lightheaded or dizzy. No chest pain. Not short of breath. Her swelling has resolved. She has gotten some new support stockings from a company in [place] ([employer]). These she can actually put on with her arthritis and they work. She is quite happy with how she is doing.   Current Outpatient Prescriptions on File Prior to Visit  Medication Sig Dispense Refill  . Abatacept (ORENCIA [place]) Inject into the skin once a week.        [name] atorvastatin (LIPITOR) 10 MG tablet Take 10 mg by mouth daily.        [name] buPROPion (WELLBUTRIN SR) 150 MG 12 hr tablet Take 150 mg by mouth 2 (two) times daily.        . Cholecalciferol (VITAMIN D) 400 UNITS capsule Take 400 Units by mouth daily.        [name] Conj Estrog-Medroxyprogest Ace (PREMPRO PO) Take 0.3 mg by mouth daily.        . digoxin (LANOXIN) 0.125 MG tablet Take 1 tablet (0.125 mg total) by mouth daily.  30 tablet  11  . diltiazem (CARDIZEM CD) 360 MG 24 hr capsule Take 1 capsule (360 mg total) by mouth daily.  30 capsule  11  . esomeprazole (NEXIUM) 40 MG capsule Take 40 mg by mouth daily.        . furosemide (LASIX) 40 MG tablet Take 1 tablet (40 mg total) by mouth daily.  30 tablet  11  . leflunomide (ARAVA) 10 MG tablet Take 10 mg by mouth daily.        . metFORMIN (GLUCOPHAGE-XR) 500 MG 24 hr tablet Take 500 mg by mouth as directed. Taking 2 daily in the evening      . metoprolol (TOPROL XL) 100 MG 24 hr  tablet Take 1 tablet (100 mg total) by mouth 2 (two) times daily.  60 tablet  11  . nitroGLYCERIN (NITROSTAT) 0.4 MG SL tablet Place 1 tablet (0.4 mg total) under the tongue every 5 (five) minutes as needed.  25 tablet  6  . Omega-3 Fatty Acids (FISH OIL) 1000 MG CAPS Take by mouth daily.        . potassium chloride (KLOR-CON 10) 10 MEQ tablet Take 1 tablet (10 mEq total) by mouth 3 (three) times daily.  90 tablet  11  . PRADAXA 150 MG CAPS TAKE ONE CAPSULE BY MOUTH EVERY 12 HOURS  60 capsule  5  . predniSONE (DELTASONE) 5 MG tablet Take 5 mg by mouth daily.        . vitamin B-12 (CYANOCOBALAMIN) 1000 MCG tablet Take 1,000 mcg by mouth daily.          No Known Allergies  Past Medical History  Diagnosis Date  . Chest pain   . Atrial fibrillation   . Anemia     hx of  . Hypertension   . Diabetes  mellitus   . Rheumatoid arthritis   . Anxiety and depression   . Gastroesophageal reflux disease   . Hyperlipidemia   . Hepatitis 1970  . Jaundice     age 23    Past Surgical History  Procedure Date  . Replacement total knee 2009    left  . Hand surgery 1995 and 1996    artificial joints both hands  . Cataract extraction     left    History  Smoking status  . Never Smoker   Smokeless tobacco  . Not on file    History  Alcohol Use No    History reviewed. No pertinent family history.  Review of Systems: The review of systems is per the HPI.  All other systems were reviewed and are negative.  Physical Exam: BP 136/84  Pulse 60  Ht 5\' 7"  (1.702 m)  Wt 193 lb 12.8 oz (87.907 kg)  BMI 30.35 kg/m2 Patient is very pleasant and in no acute distress. Skin is warm and dry. Color is normal.  HEENT is unremarkable. Normocephalic/atraumatic. PERRL. Sclera are nonicteric. Neck is supple. No masses. No JVD. Lungs are clear. Cardiac exam shows an irregular rhythm. Her rate is controlled. Abdomen is soft. Extremities are without edema. She has her support stockings in place. Gait and  ROM are intact. No gross neurologic deficits noted.   LABORATORY DATA: BMET is pending   Assessment / Plan: 1. Swelling - has improved with current regimen.  2. HTN - blood pressure looks great.  3. Obesity - she is down 5 pounds and has stopped her sodas and is avoiding "white" foods.   4. Chronic atrial fib - rate is controlled.  Overall, she is doing quite well. I will have Dr. Patty Sermons see her back in 4 months. Patient is agreeable to this plan and will call if any problems develop in the interim.

## 2012-01-06 NOTE — Telephone Encounter (Signed)
**Note De-Identified [place] Obfuscation** Message copied by Demetrios Loll on Fri Jan 06, 2012  4:48 PM ------      Message from: Rosalio Macadamia      Created: Fri Jan 06, 2012 12:50 PM       Ok to report. Change her potassium to 20 meq BID.       Recheck BMET in 2 weeks.

## 2012-01-06 NOTE — Patient Instructions (Addendum)
We need to check lab today  Continue with your current medicines  Keep up the good work with your diet!  Dr. Patty Sermons will see you in 4 months.  Call the [facility] office at [phone] if you have any questions, problems or concerns.

## 2012-01-20 ENCOUNTER — Other Ambulatory Visit (INDEPENDENT_AMBULATORY_CARE_PROVIDER_SITE_OTHER): Payer: Medicare Other

## 2012-01-20 DIAGNOSIS — I10 Essential (primary) hypertension: Secondary | ICD-10-CM

## 2012-01-20 LAB — BASIC METABOLIC PANEL
BUN: 16 mg/dL (ref 6–23)
CO2: 21 mEq/L (ref 19–32)
Calcium: 9.1 mg/dL (ref 8.4–10.5)
Chloride: 105 mEq/L (ref 96–112)
Creatinine, Ser: 0.7 mg/dL (ref 0.4–1.2)
GFR: 90.18 mL/min (ref >60.00)
Glucose, Bld: 224 mg/dL — ABNORMAL HIGH (ref 70–99)
Potassium: 4.3 mEq/L (ref 3.5–5.1)
Sodium: 140 mEq/L (ref 135–145)

## 2012-03-13 ENCOUNTER — Telehealth: Payer: Self-pay | Admitting: Cardiology

## 2012-03-13 NOTE — Telephone Encounter (Signed)
Metoprolol , prior auth needed for 2014 or needs qty limit exception form located on their  web site for silver scripts, pls call pt when done  [phone]

## 2012-04-02 NOTE — Telephone Encounter (Signed)
Forms faxed

## 2012-05-08 ENCOUNTER — Ambulatory Visit (INDEPENDENT_AMBULATORY_CARE_PROVIDER_SITE_OTHER): Payer: Medicare Other | Admitting: Cardiology

## 2012-05-08 ENCOUNTER — Encounter: Payer: Self-pay | Admitting: Cardiology

## 2012-05-08 VITALS — BP 138/64 | HR 86 | Resp 18 | Ht 67.0 in | Wt 185.0 lb

## 2012-05-08 DIAGNOSIS — R05 Cough: Secondary | ICD-10-CM

## 2012-05-08 DIAGNOSIS — E119 Type 2 diabetes mellitus without complications: Secondary | ICD-10-CM

## 2012-05-08 DIAGNOSIS — I4891 Unspecified atrial fibrillation: Secondary | ICD-10-CM

## 2012-05-08 DIAGNOSIS — E785 Hyperlipidemia, unspecified: Secondary | ICD-10-CM

## 2012-05-08 DIAGNOSIS — E7800 Pure hypercholesterolemia, unspecified: Secondary | ICD-10-CM

## 2012-05-08 MED ORDER — POTASSIUM CHLORIDE ER 10 MEQ PO TBCR: 20.0000 meq | EXTENDED_RELEASE_TABLET | Freq: Two times a day (BID) | ORAL | Status: DC

## 2012-05-08 NOTE — Assessment & Plan Note (Signed)
The patient has not been appearing to have any ischemic heart pain.  She has had occasional discomfort from her known small hiatal hernia.

## 2012-05-08 NOTE — Progress Notes (Signed)
[name] Date of Birth:  1945-12-02 [facility] [address] [place], [place]  [address] [phone]         Fax   [phone]  History of Present Illness: This 67 year old woman has HTN, DM, obesity, HLD and atrial fib. She is on chronic anticoagulation. Last echo was in January of 2013 and showed a normal EF. Digoxin was added back in the summer due to increased heart rate. We switched her diuretic to Lasix.  She has had a good response to Lasix. She was maintained on a careful diet and avoiding junk food and her weight is down another 8 pounds. She feels great. Not lightheaded or dizzy. No chest pain. Not short of breath. Her swelling has resolved. She has gotten some new support stockings from a company in [place] ([employer]). These she can actually put on with her arthritis and they work. She is quite happy with how she is doing.    Current Outpatient Prescriptions  Medication Sig Dispense Refill  . Abatacept (ORENCIA [place]) Inject into the skin once a week.        [name] atorvastatin (LIPITOR) 10 MG tablet Take 10 mg by mouth daily.        [name] buPROPion (WELLBUTRIN SR) 150 MG 12 hr tablet Take 150 mg by mouth 2 (two) times daily.        . Cholecalciferol (VITAMIN D) 400 UNITS capsule Take 400 Units by mouth daily.        [name] Conj Estrog-Medroxyprogest Ace (PREMPRO PO) Take 0.3 mg by mouth daily.        . digoxin (LANOXIN) 0.125 MG tablet Take 1 tablet (0.125 mg total) by mouth daily.  30 tablet  11  . diltiazem (CARDIZEM CD) 360 MG 24 hr capsule Take 1 capsule (360 mg total) by mouth daily.  30 capsule  11  . esomeprazole (NEXIUM) 40 MG capsule Take 40 mg by mouth daily.        . furosemide (LASIX) 40 MG tablet Take 1 tablet (40 mg total) by mouth daily.  30 tablet  11  . guaiFENesin (MUCINEX) 600 MG 12 hr tablet Take 1,200 mg by mouth 2 (two) times daily as needed.      . leflunomide (ARAVA) 10 MG tablet Take 10 mg by mouth daily.        . metFORMIN  (GLUCOPHAGE-XR) 500 MG 24 hr tablet Take 500 mg by mouth as directed. Taking 2 daily in the evening      . metoprolol (TOPROL XL) 100 MG 24 hr tablet Take 1 tablet (100 mg total) by mouth 2 (two) times daily.  60 tablet  11  . nitroGLYCERIN (NITROSTAT) 0.4 MG SL tablet Place 1 tablet (0.4 mg total) under the tongue every 5 (five) minutes as needed.  25 tablet  6  . Omega-3 Fatty Acids (FISH OIL) 1000 MG CAPS Take by mouth daily.        . potassium chloride (KLOR-CON 10) 10 MEQ tablet Take 2 tablets (20 mEq total) by mouth 2 (two) times daily.  120 tablet  11  . PRADAXA 150 MG CAPS TAKE ONE CAPSULE BY MOUTH EVERY 12 HOURS  60 capsule  5  . predniSONE (DELTASONE) 5 MG tablet Take 5 mg by mouth daily.        . vitamin B-12 (CYANOCOBALAMIN) 1000 MCG tablet Take 1,000 mcg by mouth daily.          No Known Allergies  Patient Active  Problem List  Diagnosis  . Paroxysmal atrial fibrillation  . Hypertension  . Hypercholesterolemia  . Diabetes mellitus  . Rheumatoid arthritis  . Sinus tachycardia  . Chest pain, atypical  . Chest pain  . Atrial fibrillation with RVR  . Anemia  . Anxiety and depression  . Gastroesophageal reflux disease  . Hepatitis  . Jaundice    History  Smoking status  . Never Smoker   Smokeless tobacco  . Not on file    History  Alcohol Use No    No family history on file.  Review of Systems: Constitutional: no fever chills diaphoresis or fatigue or change in weight.  Head and neck: no hearing loss, no epistaxis, no photophobia or visual disturbance. Respiratory: No cough, shortness of breath or wheezing. Cardiovascular: No chest pain peripheral edema, palpitations. Gastrointestinal: No abdominal distention, no abdominal pain, no change in bowel habits hematochezia or melena. Genitourinary: No dysuria, no frequency, no urgency, no nocturia. Musculoskeletal:No arthralgias, no back pain, no gait disturbance or myalgias. Neurological: No dizziness, no  headaches, no numbness, no seizures, no syncope, no weakness, no tremors. Hematologic: No lymphadenopathy, no easy bruising. Psychiatric: No confusion, no hallucinations, no sleep disturbance.    Physical Exam: Filed Vitals:   05/08/12 0959  BP: 138/64  Pulse: 86  Resp: 18   the general appearance reveals a well-developed well-nourished woman in no distress.The head and neck exam reveals pupils equal and reactive.  Extraocular movements are full.  There is no scleral icterus.  The mouth and pharynx are normal.  The neck is supple.  The carotids reveal no bruits.  The jugular venous pressure is normal.  The  thyroid is not enlarged.  There is no lymphadenopathy.  The chest is clear to percussion and auscultation.  There are no rales or rhonchi.  Expansion of the chest is symmetrical.  The precordium is quiet.  The pulse is irregular  The first heart sound is normal.  The second heart sound is physiologically split.  There is no murmur gallop rub or click.  There is no abnormal lift or heave.  The abdomen is soft and nontender.  The bowel sounds are normal.  The liver and spleen are not enlarged.  There are no abdominal masses.  There are no abdominal bruits.  Extremities reveal good pedal pulses.  There is no phlebitis or edema.  There is no cyanosis or clubbing.  Strength is normal and symmetrical in all extremities.  There is no lateralizing weakness.  There are no sensory deficits.  The skin is warm and dry.  There is no rash.    Assessment / Plan: The patient appears to be doing well.  She does have a mild nonproductive cough related to some mild chest congestion.  She is not on an ACE inhibitor.  She will try some plain Mucinex 600 mg twice a day as needed.  Recheck in 4 months for followup office visit lipid panel hepatic function panel basal metabolic panel and hemoglobin X9J

## 2012-05-08 NOTE — Assessment & Plan Note (Signed)
Patient is on low-dose atorvastatin 10 mg daily.  She's not having any myalgias.  We will plan to recheck lipids at her next visit

## 2012-05-08 NOTE — Assessment & Plan Note (Signed)
Her rate is adequately controlled on current medicine

## 2012-05-08 NOTE — Patient Instructions (Signed)
Add Mucinex 600 mg plain twice a day as needed for cough/congestion. Keep all other medications the same  Your physician recommends that you schedule a follow-up appointment in: 4 months with fasting labs (lp/bmet/hfp/a1c)

## 2012-06-18 ENCOUNTER — Other Ambulatory Visit: Payer: Self-pay | Admitting: *Deleted

## 2012-06-18 MED ORDER — DABIGATRAN ETEXILATE MESYLATE 150 MG PO CAPS: 150.0000 mg | ORAL_CAPSULE | Freq: Two times a day (BID) | ORAL | Status: DC

## 2012-06-28 DIAGNOSIS — R413 Other amnesia: Secondary | ICD-10-CM

## 2012-06-29 ENCOUNTER — Other Ambulatory Visit (INDEPENDENT_AMBULATORY_CARE_PROVIDER_SITE_OTHER): Payer: Medicare Other | Admitting: Diagnostic Neuroimaging

## 2012-06-29 DIAGNOSIS — R413 Other amnesia: Secondary | ICD-10-CM

## 2012-06-29 NOTE — Procedures (Signed)
[facility]  NEUROIMAGING REPORT   STUDY DATE: 06/28/12 PATIENT NAME: SHAYLA HEMING DOB: 1945-12-21 MRN: [id number]  ORDERING CLINICIAN: Joycelyn Schmid, MD CLINICAL HISTORY: 67 year old female with memory loss.  EXAM: MRI brain (with and without)  TECHNIQUE: MRI of the brain with and without contrast was obtained utilizing 5 mm axial slices with T1, T2, T2 flair, T2 star gradient echo and diffusion weighted views.  T1 sagittal, T2 coronal and postcontrast views in the axial and coronal plane were obtained. CONTRAST: 8ml gadavist IMAGING SITE: [facility]   FINDINGS:  No abnormal lesions are seen on diffusion-weighted views to suggest acute ischemia. The cortical sulci, fissures and cisterns are normal in size and appearance. Lateral, third and fourth ventricle are normal in size and appearance. No extra-axial fluid collections are seen. No evidence of mass effect or midline shift.  Minimal periventricular and subcortical chronic small vessel ischemic disease.  No abnormal lesions are seen on post contrast views.  On sagittal views the posterior fossa, pituitary gland and corpus callosum are unremarkable. No evidence of intracranial hemorrhage on gradient-echo views. The orbits and their contents, paranasal sinuses and calvarium are notable for post-surgical orbit.  Intracranial flow voids are present.  IMPRESSION:  Mildly abnormal MRI brain (with and without) demonstrating: 1. Minimal periventricular and subcortical chronic small vessel ischemic disease. 2. No acute findings.   INTERPRETING PHYSICIAN:  Suanne Marker, MD Certified in Neurology, Neurophysiology and Neuroimaging  [facility] [address] [place], [place] [address] [phone]

## 2012-07-16 ENCOUNTER — Other Ambulatory Visit: Payer: Self-pay

## 2012-07-16 DIAGNOSIS — Z1231 Encounter for screening mammogram for malignant neoplasm of breast: Secondary | ICD-10-CM

## 2012-07-31 ENCOUNTER — Other Ambulatory Visit: Payer: Self-pay | Admitting: *Deleted

## 2012-07-31 DIAGNOSIS — I4891 Unspecified atrial fibrillation: Secondary | ICD-10-CM

## 2012-07-31 MED ORDER — DILTIAZEM HCL ER COATED BEADS 360 MG PO CP24: 360.0000 mg | ORAL_CAPSULE | Freq: Every day | ORAL | Status: DC

## 2012-08-21 ENCOUNTER — Ambulatory Visit: Payer: Medicare Other

## 2012-08-29 ENCOUNTER — Other Ambulatory Visit: Payer: Self-pay | Admitting: *Deleted

## 2012-08-29 ENCOUNTER — Ambulatory Visit
Admission: RE | Admit: 2012-08-29 | Discharge: 2012-08-29 | Disposition: A | Discharge status: Home / Self Care | Payer: Medicare Other | Source: Ambulatory Visit

## 2012-08-29 DIAGNOSIS — Z1231 Encounter for screening mammogram for malignant neoplasm of breast: Secondary | ICD-10-CM

## 2012-08-29 MED ORDER — FUROSEMIDE 40 MG PO TABS: 40.0000 mg | ORAL_TABLET | Freq: Every day | ORAL | Status: DC

## 2012-08-31 ENCOUNTER — Other Ambulatory Visit: Payer: Self-pay | Admitting: *Deleted

## 2012-08-31 DIAGNOSIS — R000 Tachycardia, unspecified: Secondary | ICD-10-CM

## 2012-08-31 MED ORDER — METOPROLOL SUCCINATE ER 100 MG PO TB24: 100.0000 mg | ORAL_TABLET | Freq: Two times a day (BID) | ORAL | Status: DC

## 2012-08-31 MED ORDER — DIGOXIN 125 MCG PO TABS: 0.1250 mg | ORAL_TABLET | Freq: Every day | ORAL | Status: DC

## 2012-09-04 ENCOUNTER — Other Ambulatory Visit: Payer: Self-pay | Admitting: *Deleted

## 2012-09-04 DIAGNOSIS — I4891 Unspecified atrial fibrillation: Secondary | ICD-10-CM

## 2012-09-04 DIAGNOSIS — R000 Tachycardia, unspecified: Secondary | ICD-10-CM

## 2012-09-04 MED ORDER — DABIGATRAN ETEXILATE MESYLATE 150 MG PO CAPS: 150.0000 mg | ORAL_CAPSULE | Freq: Two times a day (BID) | ORAL | Status: DC

## 2012-09-04 MED ORDER — POTASSIUM CHLORIDE ER 10 MEQ PO TBCR: 20.0000 meq | EXTENDED_RELEASE_TABLET | Freq: Two times a day (BID) | ORAL | Status: DC

## 2012-09-04 MED ORDER — DILTIAZEM HCL ER COATED BEADS 360 MG PO CP24: 360.0000 mg | ORAL_CAPSULE | Freq: Every day | ORAL | Status: DC

## 2012-09-04 MED ORDER — METOPROLOL SUCCINATE ER 100 MG PO TB24: 100.0000 mg | ORAL_TABLET | Freq: Two times a day (BID) | ORAL | Status: DC

## 2012-09-04 MED ORDER — DIGOXIN 125 MCG PO TABS: 0.1250 mg | ORAL_TABLET | Freq: Every day | ORAL | Status: DC

## 2012-09-06 ENCOUNTER — Encounter: Payer: Self-pay | Admitting: Cardiology

## 2012-09-06 ENCOUNTER — Ambulatory Visit (INDEPENDENT_AMBULATORY_CARE_PROVIDER_SITE_OTHER): Payer: Medicare Other | Admitting: *Deleted

## 2012-09-06 ENCOUNTER — Ambulatory Visit (INDEPENDENT_AMBULATORY_CARE_PROVIDER_SITE_OTHER): Payer: Medicare Other | Admitting: Cardiology

## 2012-09-06 ENCOUNTER — Ambulatory Visit
Admission: RE | Admit: 2012-09-06 | Discharge: 2012-09-06 | Disposition: A | Discharge status: Home / Self Care | Payer: Medicare Other | Source: Ambulatory Visit | Attending: Cardiology | Admitting: Cardiology

## 2012-09-06 VITALS — BP 128/72 | HR 64 | Ht 67.0 in | Wt 177.8 lb

## 2012-09-06 DIAGNOSIS — I10 Essential (primary) hypertension: Secondary | ICD-10-CM

## 2012-09-06 DIAGNOSIS — I4891 Unspecified atrial fibrillation: Secondary | ICD-10-CM

## 2012-09-06 DIAGNOSIS — R61 Generalized hyperhidrosis: Secondary | ICD-10-CM

## 2012-09-06 DIAGNOSIS — I480 Paroxysmal atrial fibrillation: Secondary | ICD-10-CM

## 2012-09-06 DIAGNOSIS — E785 Hyperlipidemia, unspecified: Secondary | ICD-10-CM

## 2012-09-06 DIAGNOSIS — E119 Type 2 diabetes mellitus without complications: Secondary | ICD-10-CM

## 2012-09-06 DIAGNOSIS — IMO0001 Reserved for inherently not codable concepts without codable children: Secondary | ICD-10-CM

## 2012-09-06 LAB — BASIC METABOLIC PANEL
CO2: 23 mEq/L (ref 19–32)
Calcium: 9.2 mg/dL (ref 8.4–10.5)
Creatinine, Ser: 0.6 mg/dL (ref 0.4–1.2)
Glucose, Bld: 178 mg/dL — ABNORMAL HIGH (ref 70–99)

## 2012-09-06 LAB — CBC WITH DIFFERENTIAL/PLATELET
Eosinophils Absolute: 0.2 10*3/uL (ref 0.0–0.7)
Eosinophils Absolute: 0.2 10*3/uL (ref 0.0–0.7)
Eosinophils Relative: 2.2 % (ref 0.0–5.0)
HCT: 42.1 % (ref 36.0–46.0)
Lymphs Abs: 2.3 10*3/uL (ref 0.7–4.0)
MCHC: 32.5 g/dL (ref 30.0–36.0)
MCHC: 31.7 g/dL (ref 30.0–36.0)
MCV: 82 fl (ref 78.0–100.0)
MCV: 83 fl (ref 78.0–100.0)
Monocytes Absolute: 0.9 10*3/uL (ref 0.1–1.0)
Monocytes Absolute: 1 10*3/uL (ref 0.1–1.0)
Neutrophils Relative %: 64.7 % (ref 43.0–77.0)
Platelets: 294 10*3/uL (ref 150.0–400.0)
Platelets: 305 10*3/uL (ref 150.0–400.0)
WBC: 10.2 10*3/uL (ref 4.5–10.5)

## 2012-09-06 LAB — HEPATIC FUNCTION PANEL
AST: 27 U/L (ref 0–37)
Albumin: 3.2 g/dL — ABNORMAL LOW (ref 3.5–5.2)
Alkaline Phosphatase: 90 U/L (ref 39–117)
Total Bilirubin: 0.7 mg/dL (ref 0.3–1.2)

## 2012-09-06 LAB — LIPID PANEL
Cholesterol: 195 mg/dL (ref 0–200)
Total CHOL/HDL Ratio: 6

## 2012-09-06 LAB — LDL CHOLESTEROL, DIRECT: Direct LDL: 117.3 mg/dL

## 2012-09-06 NOTE — Patient Instructions (Signed)
Will obtain labs today and call you with the results (lp/bmet/hfp/a1c/cbc)  Your physician recommends that you continue on your current medications as directed. Please refer to the Current Medication list given to you today.  Your physician wants you to follow-up in: 4 months with fasting labs (lp/bmet/hfp/a1c)  You will receive a reminder letter in the mail two months in advance. If you don't receive a letter, please call our office to schedule the follow-up appointment.   YOU NEED TO GO FOR A CHEST XRAY TO THE [facility] FROM [place] ([facility])

## 2012-09-06 NOTE — Assessment & Plan Note (Signed)
The patient states that she has been having some night sweats for several months.  She does not think that she is having any hypoglycemic episodes at night.  She has a history of rheumatoid arthritis and is on long-term low-dose prednisone 5 mg daily.  We will update her chest x-ray and also check a CBC.

## 2012-09-06 NOTE — Assessment & Plan Note (Signed)
Blood pressure was remaining stable on current medication.  No symptoms of CHF.  She does tire easily when doing housework such as vacuuming.

## 2012-09-06 NOTE — Assessment & Plan Note (Signed)
The patient has not been having any awareness of severe palpitations.  She is not having any problems from her Pradaxa.  No TIA symptoms.

## 2012-09-06 NOTE — Assessment & Plan Note (Signed)
The patient has not been aware of any hypoglycemic episodes.  Generally her blood sugars have been in the 160s.

## 2012-09-06 NOTE — Progress Notes (Signed)
[name] Date of Birth:  07-26-1945 [facility] [address] [place], [place]  [address] [phone]         Fax   [phone]  History of Present Illness: This 67 year old woman has HTN, DM, obesity, HLD and atrial fib. She is on chronic anticoagulation. Last echo was in January of 2013 and showed a normal EF. Digoxin was added back in the summer due to increased heart rate. We switched her diuretic to Lasix. She has had a good response to Lasix.  She was maintained on a careful diet and avoiding junk food and her weight is down another 8 pounds. She feels great. Not lightheaded or dizzy. No chest pain. Not short of breath. Her swelling has resolved. She has gotten some new support stockings from a company in [place] ([employer]). These she can actually put on with her arthritis and they work. She is quite happy with how she is doing.    Current Outpatient Prescriptions  Medication Sig Dispense Refill  . Abatacept (ORENCIA [place]) Inject into the skin once a week.        [name] atorvastatin (LIPITOR) 10 MG tablet Take 10 mg by mouth daily.        [name] buPROPion (WELLBUTRIN SR) 150 MG 12 hr tablet Take 150 mg by mouth 2 (two) times daily.        . Cholecalciferol (VITAMIN D) 400 UNITS capsule Take 400 Units by mouth daily.        [name] Conj Estrog-Medroxyprogest Ace (PREMPRO PO) Take 0.3 mg by mouth daily.        . dabigatran (PRADAXA) 150 MG CAPS Take 1 capsule (150 mg total) by mouth every 12 (twelve) hours.  180 capsule  3  . digoxin (LANOXIN) 0.125 MG tablet Take 1 tablet (0.125 mg total) by mouth daily.  90 tablet  3  . diltiazem (CARDIZEM CD) 360 MG 24 hr capsule Take 1 capsule (360 mg total) by mouth daily.  90 capsule  3  . esomeprazole (NEXIUM) 40 MG capsule Take 40 mg by mouth daily.        . furosemide (LASIX) 40 MG tablet Take 1 tablet (40 mg total) by mouth daily.  90 tablet  3  . leflunomide (ARAVA) 10 MG tablet Take 10 mg by mouth daily.        .  metFORMIN (GLUCOPHAGE-XR) 500 MG 24 hr tablet Take 500 mg by mouth as directed. Taking 2 daily in the evening      . metoprolol succinate (TOPROL XL) 100 MG 24 hr tablet Take 1 tablet (100 mg total) by mouth 2 (two) times daily.  180 tablet  3  . nitroGLYCERIN (NITROSTAT) 0.4 MG SL tablet Place 1 tablet (0.4 mg total) under the tongue every 5 (five) minutes as needed.  25 tablet  6  . Omega-3 Fatty Acids (FISH OIL) 1000 MG CAPS Take by mouth daily.        . potassium chloride (KLOR-CON 10) 10 MEQ tablet Take 2 tablets (20 mEq total) by mouth 2 (two) times daily.  240 tablet  3  . predniSONE (DELTASONE) 5 MG tablet Take 5 mg by mouth daily.        . vitamin B-12 (CYANOCOBALAMIN) 1000 MCG tablet Take 1,000 mcg by mouth daily.         No current facility-administered medications for this visit.    No Known Allergies  Patient Active Problem List   Diagnosis Date Noted  .  Paroxysmal atrial fibrillation 07/01/2010    Priority: Medium  . Hypertension 07/01/2010    Priority: Medium  . Chest pain, atypical 07/01/2010    Priority: Medium  . Night sweats 09/06/2012  . Chest pain   . Atrial fibrillation with RVR   . Anemia   . Anxiety and depression   . Gastroesophageal reflux disease   . Hepatitis   . Jaundice   . Hypercholesterolemia 07/01/2010  . Diabetes mellitus 07/01/2010  . Rheumatoid arthritis 07/01/2010  . Sinus tachycardia 07/01/2010    History  Smoking status  . Never Smoker   Smokeless tobacco  . Not on file    History  Alcohol Use No    No family history on file.  Review of Systems: Constitutional: no fever chills diaphoresis or fatigue or change in weight.  Head and neck: no hearing loss, no epistaxis, no photophobia or visual disturbance. Respiratory: No cough, shortness of breath or wheezing. Cardiovascular: No chest pain peripheral edema, palpitations. Gastrointestinal: No abdominal distention, no abdominal pain, no change in bowel habits hematochezia or  melena. Genitourinary: No dysuria, no frequency, no urgency, no nocturia. Musculoskeletal:No arthralgias, no back pain, no gait disturbance or myalgias. Neurological: No dizziness, no headaches, no numbness, no seizures, no syncope, no weakness, no tremors. Hematologic: No lymphadenopathy, no easy bruising. Psychiatric: No confusion, no hallucinations, no sleep disturbance.    Physical Exam: Filed Vitals:   09/06/12 0921  BP: 128/72  Pulse: 64   the general appearance reveals a well-developed well-nourished woman in no distress.The head and neck exam reveals pupils equal and reactive.  Extraocular movements are full.  There is no scleral icterus.  The mouth and pharynx are normal.  The neck is supple.  The carotids reveal no bruits.  The jugular venous pressure is normal.  The  thyroid is not enlarged.  There is no lymphadenopathy.  The chest is clear to percussion and auscultation.  There are no rales or rhonchi.  Expansion of the chest is symmetrical.  The precordium is quiet.  The first heart sound is normal.  The second heart sound is physiologically split.  There is no murmur gallop rub or click.  There is no abnormal lift or heave.  The abdomen is soft and nontender.  The bowel sounds are normal.  The liver and spleen are not enlarged.  There are no abdominal masses.  There are no abdominal bruits.  Extremities reveal good pedal pulses.  There is no phlebitis or edema.  There is no cyanosis or clubbing.  Strength is normal and symmetrical in all extremities.  There is no lateralizing weakness.  There are no sensory deficits.  The skin is warm and dry.  There is no rash.     Assessment / Plan: Continue careful diet.  Her weight is down another 8 pounds intentionally.  Her cough has resolved.  He comes in for night sweats we will get a CBC and chest x-ray. Recheck in 4 months for followup office visit lipid panel hepatic function panel basal metabolic panel and A1c

## 2012-09-07 ENCOUNTER — Telehealth: Payer: Self-pay | Admitting: *Deleted

## 2012-09-07 NOTE — Telephone Encounter (Signed)
Advised patient of chest xray and labs

## 2012-09-07 NOTE — Progress Notes (Signed)
Quick Note:  Please report to patient. The recent labs are stable. Continue same medication and careful diet. The cholesterol levels are satisfactory although the triglycerides and the blood sugar are too high. The hemoglobin A1c is too high at 8.4. Followup with her primary care physician regarding her diabetes. The LDL is high at 117. Watch diet carefully and continue with weight loss ______

## 2012-09-07 NOTE — Telephone Encounter (Signed)
Message copied by Burnell Blanks on Fri Sep 07, 2012  3:56 PM ------      Message from: Cassell Clement      Created: Fri Sep 07, 2012 12:14 PM       Please report to patient.  The recent labs are stable. Continue same medication and careful diet.  The white count is normal and there is no anemia. ------

## 2012-09-07 NOTE — Telephone Encounter (Signed)
Message copied by Burnell Blanks on Fri Sep 07, 2012  3:55 PM ------      Message from: Cassell Clement      Created: Fri Sep 07, 2012 12:16 PM       Please report to patient.  The recent labs are stable. Continue same medication and careful diet.  The cholesterol levels are satisfactory although the triglycerides and the blood sugar are too high.  The hemoglobin A1c is too high at 8.4.  Followup with her primary care physician regarding her diabetes.  The LDL is high at 117.  Watch diet carefully and continue with weight loss ------

## 2012-09-07 NOTE — Telephone Encounter (Signed)
Message copied by Burnell Blanks on Fri Sep 07, 2012  3:56 PM ------      Message from: Cassell Clement      Created: Fri Sep 07, 2012 12:13 PM       The chest x-ray shows no change since last year.  The heart is still slightly enlarged but stable.  The lungs are clear. ------

## 2012-09-07 NOTE — Progress Notes (Signed)
Quick Note:  Please report to patient. The recent labs are stable. Continue same medication and careful diet. The white count is normal and there is no anemia. ______

## 2012-11-05 ENCOUNTER — Ambulatory Visit (INDEPENDENT_AMBULATORY_CARE_PROVIDER_SITE_OTHER): Payer: Medicare Other | Admitting: Ophthalmology

## 2012-11-12 ENCOUNTER — Encounter: Payer: Self-pay | Admitting: Cardiology

## 2012-11-12 ENCOUNTER — Ambulatory Visit (INDEPENDENT_AMBULATORY_CARE_PROVIDER_SITE_OTHER): Payer: Medicare Other | Admitting: Ophthalmology

## 2012-11-12 DIAGNOSIS — E1139 Type 2 diabetes mellitus with other diabetic ophthalmic complication: Secondary | ICD-10-CM

## 2012-11-12 DIAGNOSIS — I10 Essential (primary) hypertension: Secondary | ICD-10-CM

## 2012-11-12 DIAGNOSIS — H43819 Vitreous degeneration, unspecified eye: Secondary | ICD-10-CM

## 2012-11-12 DIAGNOSIS — E11319 Type 2 diabetes mellitus with unspecified diabetic retinopathy without macular edema: Secondary | ICD-10-CM

## 2012-11-12 DIAGNOSIS — H35039 Hypertensive retinopathy, unspecified eye: Secondary | ICD-10-CM

## 2012-11-12 DIAGNOSIS — H2510 Age-related nuclear cataract, unspecified eye: Secondary | ICD-10-CM

## 2012-11-12 DIAGNOSIS — H3530 Unspecified macular degeneration: Secondary | ICD-10-CM

## 2013-01-08 ENCOUNTER — Ambulatory Visit: Payer: Medicare Other | Admitting: Cardiology

## 2013-01-18 ENCOUNTER — Other Ambulatory Visit: Payer: Medicare Other

## 2013-01-18 ENCOUNTER — Telehealth: Payer: Self-pay | Admitting: Cardiology

## 2013-01-18 ENCOUNTER — Encounter: Payer: Self-pay | Admitting: Cardiology

## 2013-01-18 ENCOUNTER — Ambulatory Visit (INDEPENDENT_AMBULATORY_CARE_PROVIDER_SITE_OTHER): Payer: Medicare Other | Admitting: Cardiology

## 2013-01-18 VITALS — BP 134/80 | HR 98 | Ht 67.0 in | Wt 172.8 lb

## 2013-01-18 DIAGNOSIS — I119 Hypertensive heart disease without heart failure: Secondary | ICD-10-CM

## 2013-01-18 DIAGNOSIS — I10 Essential (primary) hypertension: Secondary | ICD-10-CM

## 2013-01-18 DIAGNOSIS — I4891 Unspecified atrial fibrillation: Secondary | ICD-10-CM

## 2013-01-18 DIAGNOSIS — M169 Osteoarthritis of hip, unspecified: Secondary | ICD-10-CM | POA: Insufficient documentation

## 2013-01-18 NOTE — Patient Instructions (Addendum)
You are cleared for hip surgery  Take your last dose of Pradaxa Sunday morning December 28th, resume when advised by surgeon   Your physician recommends that you continue on your current medications as directed. Please refer to the Current Medication list given to you today.  Your physician wants you to follow-up in: 4 month ov You will receive a reminder letter in the mail two months in advance. If you don't receive a letter, please call our office to schedule the follow-up appointment.

## 2013-01-18 NOTE — Progress Notes (Signed)
[name] Date of Birth:  January 03, 1946 [address] [place], [place]  [address] [phone]         Fax   [phone]  History of Present Illness: This 67 year old woman has HTN, DM, obesity, HLD and atrial fib. She is on chronic anticoagulation. Last echo was in January of 2013 and showed a normal EF. Digoxin was added back in the summer due to increased heart rate. We switched her diuretic to Lasix. She has had a good response to Lasix.  She was maintained on a careful diet and avoiding junk food and her weight is down another 5 pounds. She feels great. Not lightheaded or dizzy. No chest pain. Not short of breath. Her swelling has resolved. She is scheduled for right hip surgery December 31.   Current Outpatient Prescriptions  Medication Sig Dispense Refill  . Abatacept (ORENCIA [place]) Inject 125 mg into the skin once a week.       [name] atorvastatin (LIPITOR) 10 MG tablet Take 10 mg by mouth daily.        [name] buPROPion (WELLBUTRIN SR) 150 MG 12 hr tablet Take 150 mg by mouth 2 (two) times daily.        . Cholecalciferol (VITAMIN D) 400 UNITS capsule Take 400 Units by mouth daily.        [name] Conj Estrog-Medroxyprogest Ace (PREMPRO PO) Take 0.3 mg by mouth daily.        . dabigatran (PRADAXA) 150 MG CAPS Take 1 capsule (150 mg total) by mouth every 12 (twelve) hours.  180 capsule  3  . digoxin (LANOXIN) 0.125 MG tablet Take 1 tablet (0.125 mg total) by mouth daily.  90 tablet  3  . diltiazem (CARDIZEM CD) 360 MG 24 hr capsule Take 1 capsule (360 mg total) by mouth daily.  90 capsule  3  . esomeprazole (NEXIUM) 40 MG capsule Take 40 mg by mouth daily.        . furosemide (LASIX) 40 MG tablet Take 1 tablet (40 mg total) by mouth daily.  90 tablet  3  . leflunomide (ARAVA) 10 MG tablet Take 20 mg by mouth daily.       . metFORMIN (GLUCOPHAGE-XR) 500 MG 24 hr tablet Take 500 mg by mouth as directed. Taking 2 daily in the evening      . metoprolol succinate (TOPROL XL) 100 MG 24 hr  tablet Take 1 tablet (100 mg total) by mouth 2 (two) times daily.  180 tablet  3  . nitroGLYCERIN (NITROSTAT) 0.4 MG SL tablet Place 1 tablet (0.4 mg total) under the tongue every 5 (five) minutes as needed.  25 tablet  6  . Omega-3 Fatty Acids (FISH OIL) 1000 MG CAPS Take by mouth daily.        . potassium chloride (KLOR-CON 10) 10 MEQ tablet Take 2 tablets (20 mEq total) by mouth 2 (two) times daily.  240 tablet  3  . predniSONE (DELTASONE) 5 MG tablet Take 5 mg by mouth daily.        . vitamin B-12 (CYANOCOBALAMIN) 1000 MCG tablet Take 1,000 mcg by mouth daily.         No current facility-administered medications for this visit.    No Known Allergies  Patient Active Problem List   Diagnosis Date Noted  . Paroxysmal atrial fibrillation 07/01/2010    Priority: Medium  . Hypertension 07/01/2010    Priority: Medium  . Chest pain, atypical 07/01/2010    Priority: Medium  .  Night sweats 09/06/2012  . Chest pain   . Atrial fibrillation with RVR   . Anemia   . Anxiety and depression   . Gastroesophageal reflux disease   . Hepatitis   . Jaundice   . Hypercholesterolemia 07/01/2010  . Diabetes mellitus 07/01/2010  . Rheumatoid arthritis 07/01/2010  . Sinus tachycardia 07/01/2010    History  Smoking status  . Never Smoker   Smokeless tobacco  . Not on file    History  Alcohol Use No    No family history on file.  Review of Systems: Constitutional: no fever chills diaphoresis or fatigue or change in weight.  Head and neck: no hearing loss, no epistaxis, no photophobia or visual disturbance. Respiratory: No cough, shortness of breath or wheezing. Cardiovascular: No chest pain peripheral edema, palpitations. Gastrointestinal: No abdominal distention, no abdominal pain, no change in bowel habits hematochezia or melena. Genitourinary: No dysuria, no frequency, no urgency, no nocturia. Musculoskeletal:No arthralgias, no back pain, no gait disturbance or  myalgias. Neurological: No dizziness, no headaches, no numbness, no seizures, no syncope, no weakness, no tremors. Hematologic: No lymphadenopathy, no easy bruising. Psychiatric: No confusion, no hallucinations, no sleep disturbance.    Physical Exam: Filed Vitals:   01/18/13 0927  BP: 134/80  Pulse: 98   the general appearance reveals a well-developed well-nourished woman in no distress.The head and neck exam reveals pupils equal and reactive.  Extraocular movements are full.  There is no scleral icterus.  The mouth and pharynx are normal.  The neck is supple.  The carotids reveal no bruits.  The jugular venous pressure is normal.  The  thyroid is not enlarged.  There is no lymphadenopathy.  The chest is clear to percussion and auscultation.  There are no rales or rhonchi.  Expansion of the chest is symmetrical.  The precordium is quiet.  The first heart sound is normal.  The second heart sound is physiologically split.  There is no murmur gallop rub or click.  There is no abnormal lift or heave.  The abdomen is soft and nontender.  The bowel sounds are normal.  The liver and spleen are not enlarged.  There are no abdominal masses.  There are no abdominal bruits.  Extremities reveal good pedal pulses.  There is no phlebitis or edema.  There is no cyanosis or clubbing.  Strength is normal and symmetrical in all extremities.  There is no lateralizing weakness.  There are no sensory deficits.  The skin is warm and dry.  There is no rash.  EKG today shows atrial fibrillation with controlled VR. No change since 12/09/11.   Assessment / Plan: Patient is doing well. Cleared for December 31 hip surgery. She will take her last dose of Pradaxa Sunday morning December 28. Recheck here 4 mon for OV. Continue current meds.

## 2013-01-18 NOTE — Assessment & Plan Note (Signed)
Rate is well controlled on current Rx.  No TIA sx. On Pradaxa.

## 2013-01-18 NOTE — Assessment & Plan Note (Signed)
Today we are giving clearance for upcoming hip replacement.

## 2013-01-18 NOTE — Assessment & Plan Note (Signed)
No headaches or dizziness. No Sx of CHF

## 2013-01-18 NOTE — Telephone Encounter (Signed)
Received request from Nurse, documents faxed for surgical clearance. To: [facility] Fax number: [phone] Attention: 01/18/13/KM

## 2013-01-29 ENCOUNTER — Other Ambulatory Visit: Payer: Self-pay | Admitting: Cardiology

## 2013-03-15 NOTE — Progress Notes (Signed)
Surgery scheduled for 04/03/13 Need orders in EPIC.  Thank You.  

## 2013-03-19 ENCOUNTER — Other Ambulatory Visit: Payer: Self-pay | Admitting: Orthopedic Surgery

## 2013-03-19 NOTE — Progress Notes (Signed)
Preoperative surgical orders have been place into the Epic hospital system for [name] on 03/19/2013, 9:59 AM  by Patrica Duel for surgery on 12/31-2014.  Preop Total Hip - Anterior Approach orders including Experel Injecion, IV Tylenol, and IV Decadron as long as there are no contraindications to the above medications. Avel Peace, PA-C

## 2013-03-21 ENCOUNTER — Other Ambulatory Visit (HOSPITAL_COMMUNITY): Payer: Self-pay | Admitting: Orthopedic Surgery

## 2013-03-21 ENCOUNTER — Encounter (HOSPITAL_COMMUNITY): Payer: Self-pay | Admitting: Pharmacy Technician

## 2013-03-21 NOTE — Patient Instructions (Addendum)
19 [name]  03/21/2013   Your procedure is scheduled on:  04/03/13  [facility]  Report to [facility] at   0845    AM.  Call this number if you have problems the morning of surgery: [phone]       Remember:   Do not eat food  Or drink :After Midnight. Tuesday NIGHT   Take these medicines the morning of surgery with A SIP OF WATER: Bupropion, Digoxin, METOPROLOL, Diltiazem, Nexium               MAY TAKE NITROGLYCERIN IF NEEDED                                              DO NOT TAKE ANY DIABETES MEDICATION MORNING OF SURGERY  .  Contacts, dentures or partial plates can not be worn to surgery  Leave suitcase in the car. After surgery it may be brought to your room.  For patients admitted to the hospital, checkout time is 11:00 AM day of  discharge.             SPECIAL INSTRUCTIONS- SEE  PREPARING FOR SURGERY INSTRUCTION SHEET-     DO NOT WEAR JEWELRY, LOTIONS, POWDERS, OR PERFUMES.  WOMEN-- DO NOT SHAVE LEGS OR UNDERARMS FOR 12 HOURS BEFORE SHOWERS. MEN MAY SHAVE FACE.  Patients discharged the day of surgery will not be allowed to drive home. IF going home the day of surgery, you must have a driver and someone to stay with you for the first 24 hours  Name and phone number of your driver:   admission                                                                     Please read over the following fact sheets that you were given: MRSA Information, Incentive Spirometry Sheet, Blood Transfusion Sheet  Information          I am aware that I will have to have lab draw morning of surgery                                                                       I am aware that I need to call rheumatologist today about  possibly stopping  ARAVA BEFORE SURGERY  [name]  PST [phone]  [phone]                 FAILURE TO FOLLOW THESE INSTRUCTIONS MAY RESULT IN  CANCELLATION   OF YOUR SURGERY                                                  Patient Signature  _____________________________

## 2013-03-21 NOTE — Progress Notes (Signed)
Clearance with LOV and EKG Dr Patty Sermons 10/14 EPIC, eccho 1/13, chest x ray 09/15/12 EPIC

## 2013-03-25 ENCOUNTER — Encounter (HOSPITAL_COMMUNITY)
Admission: RE | Admit: 2013-03-25 | Discharge: 2013-03-25 | Disposition: A | Discharge status: Home / Self Care | Payer: Medicare Other | Source: Ambulatory Visit | Attending: Orthopedic Surgery | Admitting: Orthopedic Surgery

## 2013-03-25 ENCOUNTER — Ambulatory Visit (HOSPITAL_COMMUNITY)
Admission: RE | Admit: 2013-03-25 | Discharge: 2013-03-25 | Disposition: A | Discharge status: Home / Self Care | Payer: Medicare Other | Source: Ambulatory Visit | Attending: Orthopedic Surgery | Admitting: Orthopedic Surgery

## 2013-03-25 ENCOUNTER — Encounter (HOSPITAL_COMMUNITY): Payer: Self-pay

## 2013-03-25 DIAGNOSIS — M948X9 Other specified disorders of cartilage, unspecified sites: Secondary | ICD-10-CM | POA: Insufficient documentation

## 2013-03-25 DIAGNOSIS — M1610 Unilateral primary osteoarthritis, unspecified hip: Secondary | ICD-10-CM | POA: Insufficient documentation

## 2013-03-25 DIAGNOSIS — M76899 Other specified enthesopathies of unspecified lower limb, excluding foot: Secondary | ICD-10-CM | POA: Insufficient documentation

## 2013-03-25 DIAGNOSIS — Z01812 Encounter for preprocedural laboratory examination: Secondary | ICD-10-CM | POA: Insufficient documentation

## 2013-03-25 DIAGNOSIS — Z01818 Encounter for other preprocedural examination: Secondary | ICD-10-CM | POA: Insufficient documentation

## 2013-03-25 DIAGNOSIS — M169 Osteoarthritis of hip, unspecified: Secondary | ICD-10-CM | POA: Insufficient documentation

## 2013-03-25 LAB — URINALYSIS, ROUTINE W REFLEX MICROSCOPIC
Bilirubin Urine: NEGATIVE
Glucose, UA: NEGATIVE mg/dL
Hgb urine dipstick: NEGATIVE
Ketones, ur: NEGATIVE mg/dL
Nitrite: NEGATIVE
Protein, ur: 100 mg/dL — AB
pH: 6 (ref 5.0–8.0)

## 2013-03-25 LAB — CBC
Hemoglobin: 13.6 g/dL (ref 12.0–15.0)
Platelets: 300 10*3/uL (ref 150–400)
RBC: 5.12 MIL/uL — ABNORMAL HIGH (ref 3.87–5.11)
WBC: 11.4 10*3/uL — ABNORMAL HIGH (ref 4.0–10.5)

## 2013-03-25 LAB — APTT: aPTT: 32 seconds (ref 24–37)

## 2013-03-25 LAB — COMPREHENSIVE METABOLIC PANEL
ALT: 18 U/L (ref 0–35)
AST: 27 U/L (ref 0–37)
Alkaline Phosphatase: 119 U/L — ABNORMAL HIGH (ref 39–117)
CO2: 26 mEq/L (ref 19–32)
Chloride: 98 mEq/L (ref 96–112)
GFR calc Af Amer: >90 mL/min (ref >90)
GFR calc non Af Amer: >90 mL/min (ref >90)
Glucose, Bld: 239 mg/dL — ABNORMAL HIGH (ref 70–99)
Potassium: 3.4 mEq/L — ABNORMAL LOW (ref 3.5–5.1)
Sodium: 138 mEq/L (ref 135–145)
Total Bilirubin: 0.5 mg/dL (ref 0.3–1.2)

## 2013-03-25 LAB — URINE MICROSCOPIC-ADD ON

## 2013-03-25 LAB — PROTIME-INR: INR: 1.16 (ref 0.00–1.49)

## 2013-03-25 NOTE — Progress Notes (Signed)
Faxed cbc, cmet, pt,ptt,urine with micro to Dr Lequita Halt via [facility]

## 2013-03-26 NOTE — Progress Notes (Signed)
Received fax from Dr Lequita Halt- no action re labs.   Patient called back today and stated her rheumatologist stated no need to stop any other arthritis meds pre op

## 2013-03-31 NOTE — H&P (Signed)
TOTAL HIP ADMISSION H&P  Patient is admitted for right total hip arthroplasty.  Subjective:  Chief Complaint: right hip pain  HPI: [name], 67 y.o. female, has a history of pain and functional disability in the right hip(s) due to arthritis and patient has failed non-surgical conservative treatments for greater than 12 weeks to include NSAID's and/or analgesics, use of assistive devices and activity modification.  Onset of symptoms was gradual starting 2 years ago with gradually worsening course since that time.The patient noted no past surgery on the right hip(s).  Patient currently rates pain in the right hip at 7 out of 10 with activity. Patient has night pain, worsening of pain with activity and weight bearing, pain that interfers with activities of daily living, pain with passive range of motion and crepitus. Patient has evidence of subchondral cysts, periarticular osteophytes and joint space narrowing by imaging studies.  There is no current active infection.  Patient Active Problem List   Diagnosis Date Noted  . Osteoarthritis of hip 01/18/2013  . Night sweats 09/06/2012  . Chest pain   . Atrial fibrillation with RVR   . Anemia   . Anxiety and depression   . Gastroesophageal reflux disease   . Hepatitis   . Jaundice   . Paroxysmal atrial fibrillation 07/01/2010  . Hypertension 07/01/2010  . Hypercholesterolemia 07/01/2010  . Diabetes mellitus 07/01/2010  . Rheumatoid arthritis 07/01/2010  . Sinus tachycardia 07/01/2010  . Chest pain, atypical 07/01/2010   Past Medical History  Diagnosis Date  . Chest pain   . Atrial fibrillation   . Anemia     hx of  . Hypertension   . Diabetes mellitus   . Rheumatoid arthritis(714.0)   . Anxiety and depression   . Gastroesophageal reflux disease   . Hyperlipidemia   . Hepatitis 1970  . Jaundice     age 89    Past Surgical History  Procedure Laterality Date  . Replacement total knee  2009    denies  . Hand surgery  1995  and 1996    artificial joints both hands  . Cataract extraction      left  . Joint replacement Left 2009    HIP     Current outpatient prescriptions: Abatacept (ORENCIA [place]), Inject 125 mg into the skin once a week. , Disp: , Rfl: ;   atorvastatin (LIPITOR) 10 MG tablet, Take 10 mg by mouth every other day. , Disp: , Rfl: ;   buPROPion (WELLBUTRIN SR) 150 MG 12 hr tablet, Take 150 mg by mouth 2 (two) times daily.  , Disp: , Rfl: ;   Cholecalciferol (VITAMIN D) 2000 UNITS tablet, Take 2,000 Units by mouth daily., Disp: , Rfl:  cyanocobalamin 2000 MCG tablet, Take 2,000 mcg by mouth daily., Disp: , Rfl: ;   dabigatran (PRADAXA) 150 MG CAPS capsule, Take 150 mg by mouth every 12 (twelve) hours. States Last dose will be 03/31/13 AM, Disp: , Rfl: ;   digoxin (LANOXIN) 0.125 MG tablet, Take 0.125 mg by mouth every morning., Disp: , Rfl: ;   diltiazem (CARDIZEM CD) 360 MG 24 hr capsule, Take 360 mg by mouth every morning., Disp: , Rfl:  esomeprazole (NEXIUM) 40 MG capsule, Take 40 mg by mouth daily.  , Disp: , Rfl: ;   estrogen, conjugated,-medroxyprogesterone (PREMPRO) 0.3-1.5 MG per tablet, Take 1 tablet by mouth every other day. , Disp: , Rfl: ;   furosemide (LASIX) 40 MG tablet, Take 40 mg by mouth every morning.,  Disp: , Rfl: ;   leflunomide (ARAVA) 20 MG tablet, Take 20 mg by mouth daily., Disp: , Rfl:  metFORMIN (GLUCOPHAGE) 500 MG tablet, Take 1,000 mg by mouth 2 (two) times daily with a meal., Disp: , Rfl: ;   metoprolol succinate (TOPROL XL) 100 MG 24 hr tablet, Take 1 tablet (100 mg total) by mouth 2 (two) times daily., Disp: 180 tablet, Rfl: 3;   nitroGLYCERIN (NITROSTAT) 0.4 MG SL tablet, Place 1 tablet (0.4 mg total) under the tongue every 5 (five) minutes as needed., Disp: 25 tablet, Rfl: 6 Omega 3 1200 MG CAPS, Take 2,400 mg by mouth daily., Disp: , Rfl: ;   potassium chloride (KLOR-CON 10) 10 MEQ tablet, Take 2 tablets (20 mEq total) by mouth 2 (two) times daily., Disp: 240 tablet,  Rfl: 3;   predniSONE (DELTASONE) 5 MG tablet, Take 5 mg by mouth daily. , Disp: , Rfl:   No Known Allergies  History  Substance Use Topics  . Smoking status: Never Smoker   . Smokeless tobacco: Never Used  . Alcohol Use: No    Family History Father deceased age 86 due to a stroke Mother deceased age 82 due to pneumonia  Review of Systems  Constitutional: Negative.   Eyes: Negative.   Respiratory: Negative.   Cardiovascular: Negative.   Gastrointestinal: Negative.   Genitourinary: Negative.   Musculoskeletal: Positive for joint pain and myalgias. Negative for back pain, falls and neck pain.       Right hip pain  Skin: Negative.   Neurological: Negative.   Endo/Heme/Allergies: Negative.   Psychiatric/Behavioral: Negative.     Objective:  Physical Exam  Constitutional: She is oriented to person, place, and time. She appears well-developed and well-nourished. No distress.  HENT:  Head: Normocephalic and atraumatic.  Right Ear: External ear normal.  Left Ear: External ear normal.  Nose: Nose normal.  Eyes: Conjunctivae and EOM are normal.  Neck: Normal range of motion. Neck supple.  Cardiovascular: Normal rate, normal heart sounds and intact distal pulses.  An irregularly irregular rhythm present.  No murmur heard. Respiratory: Effort normal and breath sounds normal. No respiratory distress. She has no wheezes.  GI: Soft. Bowel sounds are normal.  Musculoskeletal:       Right hip: She exhibits decreased range of motion and decreased strength.       Left hip: She exhibits decreased range of motion.       Right knee: Normal.       Left knee: Normal.       Right lower leg: She exhibits no tenderness and no swelling.       Left lower leg: She exhibits no tenderness and no swelling.  Right hip can be flexed to 90, no internal rotation, about 10 to 20 of external rotation, 10 to 20 of abduction. She has pain on those ranges of motion. Right knee exam is unremarkable with full  range of motion, no tenderness, effusion or instability.  Her left hip which has been replaced has flexion to 100, rotation in 20, out 30, abduction 30.  Neurological: She is alert and oriented to person, place, and time. She has normal strength and normal reflexes. No sensory deficit.  Skin: She is not diaphoretic.    Vitals Weight: 155 lb Height: 66.5 in Body Surface Area: 1.82 m Body Mass Index: 24.64 kg/m Pulse: 72 (Regular) BP: 124/86 (Sitting, Left Arm, Standard)  Imaging Review Plain radiographs demonstrate severe degenerative joint disease of the right hip(s). The  bone quality appears to be adequate for age and reported activity level.  Assessment/Plan:  End stage arthritis, right hip(s)  The patient history, physical examination, clinical judgement of the provider and imaging studies are consistent with end stage degenerative joint disease of the right hip(s) and total hip arthroplasty is deemed medically necessary. The treatment options including medical management, injection therapy, arthroscopy and arthroplasty were discussed at length. The risks and benefits of total hip arthroplasty were presented and reviewed. The risks due to aseptic loosening, infection, stiffness, dislocation/subluxation,  thromboembolic complications and other imponderables were discussed.  The patient acknowledged the explanation, agreed to proceed with the plan and consent was signed. Patient is being admitted for inpatient treatment for surgery, pain control, PT, OT, prophylactic antibiotics, VTE prophylaxis, progressive ambulation and ADL's and discharge planning.The patient is planning to be discharged home with home health services    NO TXA to be given     Dimitri Ped, PA-C

## 2013-04-03 ENCOUNTER — Encounter (HOSPITAL_COMMUNITY): Payer: Medicare Other | Admitting: Anesthesiology

## 2013-04-03 ENCOUNTER — Inpatient Hospital Stay (HOSPITAL_COMMUNITY)
Admission: RE | Admit: 2013-04-03 | Discharge: 2013-04-05 | DRG: 470 | Disposition: A | Discharge status: Home Health | Payer: Medicare Other | Source: Ambulatory Visit | Attending: Orthopedic Surgery | Admitting: Orthopedic Surgery

## 2013-04-03 ENCOUNTER — Encounter (HOSPITAL_COMMUNITY)
Admission: RE | Disposition: A | Discharge status: Home Health | Payer: Self-pay | Source: Ambulatory Visit | Attending: Orthopedic Surgery

## 2013-04-03 ENCOUNTER — Inpatient Hospital Stay (HOSPITAL_COMMUNITY): Payer: Medicare Other

## 2013-04-03 ENCOUNTER — Inpatient Hospital Stay (HOSPITAL_COMMUNITY): Payer: Medicare Other | Admitting: Anesthesiology

## 2013-04-03 ENCOUNTER — Encounter (HOSPITAL_COMMUNITY): Payer: Self-pay | Admitting: *Deleted

## 2013-04-03 DIAGNOSIS — I10 Essential (primary) hypertension: Secondary | ICD-10-CM | POA: Diagnosis present

## 2013-04-03 DIAGNOSIS — E119 Type 2 diabetes mellitus without complications: Secondary | ICD-10-CM | POA: Diagnosis present

## 2013-04-03 DIAGNOSIS — I4891 Unspecified atrial fibrillation: Secondary | ICD-10-CM

## 2013-04-03 DIAGNOSIS — F329 Major depressive disorder, single episode, unspecified: Secondary | ICD-10-CM

## 2013-04-03 DIAGNOSIS — F3289 Other specified depressive episodes: Secondary | ICD-10-CM | POA: Diagnosis present

## 2013-04-03 DIAGNOSIS — M1610 Unilateral primary osteoarthritis, unspecified hip: Principal | ICD-10-CM | POA: Diagnosis present

## 2013-04-03 DIAGNOSIS — M069 Rheumatoid arthritis, unspecified: Secondary | ICD-10-CM

## 2013-04-03 DIAGNOSIS — Z9849 Cataract extraction status, unspecified eye: Secondary | ICD-10-CM

## 2013-04-03 DIAGNOSIS — E785 Hyperlipidemia, unspecified: Secondary | ICD-10-CM | POA: Diagnosis present

## 2013-04-03 DIAGNOSIS — I480 Paroxysmal atrial fibrillation: Secondary | ICD-10-CM

## 2013-04-03 DIAGNOSIS — Z96659 Presence of unspecified artificial knee joint: Secondary | ICD-10-CM

## 2013-04-03 DIAGNOSIS — Z6826 Body mass index (BMI) 26.0-26.9, adult: Secondary | ICD-10-CM

## 2013-04-03 DIAGNOSIS — K219 Gastro-esophageal reflux disease without esophagitis: Secondary | ICD-10-CM

## 2013-04-03 DIAGNOSIS — M169 Osteoarthritis of hip, unspecified: Secondary | ICD-10-CM | POA: Diagnosis present

## 2013-04-03 DIAGNOSIS — F411 Generalized anxiety disorder: Secondary | ICD-10-CM | POA: Diagnosis present

## 2013-04-03 DIAGNOSIS — E7800 Pure hypercholesterolemia, unspecified: Secondary | ICD-10-CM

## 2013-04-03 DIAGNOSIS — Z96649 Presence of unspecified artificial hip joint: Secondary | ICD-10-CM

## 2013-04-03 DIAGNOSIS — Z823 Family history of stroke: Secondary | ICD-10-CM

## 2013-04-03 HISTORY — PX: TOTAL HIP ARTHROPLASTY: SHX124

## 2013-04-03 LAB — GLUCOSE, CAPILLARY
Glucose-Capillary: 221 mg/dL — ABNORMAL HIGH (ref 70–99)
Glucose-Capillary: 175 mg/dL — ABNORMAL HIGH (ref 70–99)
Glucose-Capillary: 183 mg/dL — ABNORMAL HIGH (ref 70–99)
Glucose-Capillary: 295 mg/dL — ABNORMAL HIGH (ref 70–99)

## 2013-04-03 LAB — TYPE AND SCREEN: Antibody Screen: NEGATIVE

## 2013-04-03 SURGERY — ARTHROPLASTY, HIP, TOTAL, ANTERIOR APPROACH
Anesthesia: General | Site: Hip | Laterality: Right

## 2013-04-03 MED ORDER — PREDNISONE 20 MG PO TABS
20.0000 mg | ORAL_TABLET | Freq: Once | ORAL | Status: AC
  Administered 2013-04-03: 20:00:00 20 mg via ORAL
  Filled 2013-04-03: qty 1

## 2013-04-03 MED ORDER — METOPROLOL SUCCINATE ER 100 MG PO TB24
100.0000 mg | ORAL_TABLET | Freq: Two times a day (BID) | ORAL | Status: DC
  Administered 2013-04-04 – 2013-04-05 (×3): 100 mg via ORAL
  Filled 2013-04-03 (×5): qty 1

## 2013-04-03 MED ORDER — POTASSIUM CHLORIDE IN NACL 20-0.9 MEQ/L-% IV SOLN
INTRAVENOUS | Status: DC
  Administered 2013-04-03 – 2013-04-04 (×2): via INTRAVENOUS
  Filled 2013-04-03 (×6): qty 1000

## 2013-04-03 MED ORDER — SODIUM CHLORIDE 0.9 % IJ SOLN
INTRAMUSCULAR | Status: AC
  Filled 2013-04-03: qty 50

## 2013-04-03 MED ORDER — BUPIVACAINE HCL (PF) 0.25 % IJ SOLN
INTRAMUSCULAR | Status: DC | PRN
  Administered 2013-04-03: 20 mL

## 2013-04-03 MED ORDER — INSULIN ASPART 100 UNIT/ML ~~LOC~~ SOLN
0.0000 [IU] | Freq: Three times a day (TID) | SUBCUTANEOUS | Status: DC
  Administered 2013-04-03 – 2013-04-04 (×3): 8 [IU] via SUBCUTANEOUS
  Administered 2013-04-04: 14:00:00 11 [IU] via SUBCUTANEOUS
  Administered 2013-04-05: 11:00:00 8 [IU] via SUBCUTANEOUS
  Administered 2013-04-05: 08:00:00 5 [IU] via SUBCUTANEOUS

## 2013-04-03 MED ORDER — ENOXAPARIN SODIUM 40 MG/0.4ML ~~LOC~~ SOLN
40.0000 mg | SUBCUTANEOUS | Status: DC
  Administered 2013-04-04 – 2013-04-05 (×2): 40 mg via SUBCUTANEOUS
  Filled 2013-04-03 (×3): qty 0.4

## 2013-04-03 MED ORDER — HYDROMORPHONE HCL PF 2 MG/ML IJ SOLN
INTRAMUSCULAR | Status: AC
  Filled 2013-04-03: qty 1

## 2013-04-03 MED ORDER — ACETAMINOPHEN 650 MG RE SUPP: 650.0000 mg | Freq: Four times a day (QID) | RECTAL | Status: DC | PRN

## 2013-04-03 MED ORDER — ACETAMINOPHEN 10 MG/ML IV SOLN
1000.0000 mg | Freq: Once | INTRAVENOUS | Status: AC
  Administered 2013-04-03: 1000 mg via INTRAVENOUS
  Filled 2013-04-03: qty 100

## 2013-04-03 MED ORDER — LIDOCAINE HCL (CARDIAC) 20 MG/ML IV SOLN
INTRAVENOUS | Status: AC
  Filled 2013-04-03: qty 5

## 2013-04-03 MED ORDER — BUPIVACAINE LIPOSOME 1.3 % IJ SUSP
20.0000 mL | Freq: Once | INTRAMUSCULAR | Status: DC
  Filled 2013-04-03: qty 20

## 2013-04-03 MED ORDER — DIGOXIN 125 MCG PO TABS
0.1250 mg | ORAL_TABLET | Freq: Every morning | ORAL | Status: DC
  Administered 2013-04-04 – 2013-04-05 (×2): 0.125 mg via ORAL
  Filled 2013-04-03 (×2): qty 1

## 2013-04-03 MED ORDER — NITROGLYCERIN 0.4 MG SL SUBL: 0.4000 mg | SUBLINGUAL_TABLET | SUBLINGUAL | Status: DC | PRN

## 2013-04-03 MED ORDER — ATORVASTATIN CALCIUM 10 MG PO TABS
10.0000 mg | ORAL_TABLET | ORAL | Status: DC
  Administered 2013-04-04: 11:00:00 10 mg via ORAL
  Filled 2013-04-03: qty 1

## 2013-04-03 MED ORDER — STERILE WATER FOR IRRIGATION IR SOLN
Status: DC | PRN
  Administered 2013-04-03: 1500 mL

## 2013-04-03 MED ORDER — NEOSTIGMINE METHYLSULFATE 1 MG/ML IJ SOLN
INTRAMUSCULAR | Status: DC | PRN
  Administered 2013-04-03: 3.5 mg via INTRAVENOUS

## 2013-04-03 MED ORDER — GLYCOPYRROLATE 0.2 MG/ML IJ SOLN
INTRAMUSCULAR | Status: AC
  Filled 2013-04-03: qty 2

## 2013-04-03 MED ORDER — DOCUSATE SODIUM 100 MG PO CAPS
100.0000 mg | ORAL_CAPSULE | Freq: Two times a day (BID) | ORAL | Status: DC
  Administered 2013-04-03 – 2013-04-04 (×3): 100 mg via ORAL

## 2013-04-03 MED ORDER — DEXAMETHASONE SODIUM PHOSPHATE 10 MG/ML IJ SOLN
INTRAMUSCULAR | Status: AC
  Filled 2013-04-03: qty 1

## 2013-04-03 MED ORDER — BISACODYL 10 MG RE SUPP: 10.0000 mg | Freq: Every day | RECTAL | Status: DC | PRN

## 2013-04-03 MED ORDER — CEFAZOLIN SODIUM-DEXTROSE 2-3 GM-% IV SOLR
INTRAVENOUS | Status: AC
  Filled 2013-04-03: qty 50

## 2013-04-03 MED ORDER — ONDANSETRON HCL 4 MG/2ML IJ SOLN
INTRAMUSCULAR | Status: DC | PRN
  Administered 2013-04-03: 4 mg via INTRAVENOUS

## 2013-04-03 MED ORDER — POLYETHYLENE GLYCOL 3350 17 G PO PACK: 17.0000 g | PACK | Freq: Every day | ORAL | Status: DC | PRN

## 2013-04-03 MED ORDER — PROPOFOL 10 MG/ML IV BOLUS
INTRAVENOUS | Status: DC | PRN
  Administered 2013-04-03: 150 mg via INTRAVENOUS

## 2013-04-03 MED ORDER — METFORMIN HCL 500 MG PO TABS
1000.0000 mg | ORAL_TABLET | Freq: Two times a day (BID) | ORAL | Status: DC
  Administered 2013-04-04 – 2013-04-05 (×3): 1000 mg via ORAL
  Filled 2013-04-03 (×5): qty 2

## 2013-04-03 MED ORDER — PREDNISONE 5 MG PO TABS
5.0000 mg | ORAL_TABLET | Freq: Two times a day (BID) | ORAL | Status: DC
  Filled 2013-04-03: qty 1

## 2013-04-03 MED ORDER — KETOROLAC TROMETHAMINE 15 MG/ML IJ SOLN
INTRAMUSCULAR | Status: AC
  Filled 2013-04-03: qty 1

## 2013-04-03 MED ORDER — SUCCINYLCHOLINE CHLORIDE 20 MG/ML IJ SOLN
INTRAMUSCULAR | Status: DC | PRN
  Administered 2013-04-03: 100 mg via INTRAVENOUS
  Administered 2013-04-03: 60 mg via INTRAVENOUS

## 2013-04-03 MED ORDER — POTASSIUM CHLORIDE ER 10 MEQ PO TBCR
20.0000 meq | EXTENDED_RELEASE_TABLET | Freq: Two times a day (BID) | ORAL | Status: DC
  Administered 2013-04-03 – 2013-04-05 (×4): 20 meq via ORAL
  Filled 2013-04-03 (×5): qty 2

## 2013-04-03 MED ORDER — OXYCODONE HCL 5 MG PO TABS
5.0000 mg | ORAL_TABLET | ORAL | Status: DC | PRN
  Administered 2013-04-03: 10 mg via ORAL
  Administered 2013-04-04 – 2013-04-05 (×3): 5 mg via ORAL
  Filled 2013-04-03 (×3): qty 1
  Filled 2013-04-03: qty 2

## 2013-04-03 MED ORDER — SODIUM CHLORIDE 0.9 % IJ SOLN
INTRAMUSCULAR | Status: DC | PRN
  Administered 2013-04-03: 30 mL

## 2013-04-03 MED ORDER — LACTATED RINGERS IV SOLN
INTRAVENOUS | Status: DC | PRN
  Administered 2013-04-03 (×2): via INTRAVENOUS

## 2013-04-03 MED ORDER — FENTANYL CITRATE 0.05 MG/ML IJ SOLN
INTRAMUSCULAR | Status: AC
  Filled 2013-04-03: qty 5

## 2013-04-03 MED ORDER — PROPOFOL 10 MG/ML IV BOLUS
INTRAVENOUS | Status: AC
  Filled 2013-04-03: qty 20

## 2013-04-03 MED ORDER — TRAMADOL HCL 50 MG PO TABS: 50.0000 mg | ORAL_TABLET | Freq: Four times a day (QID) | ORAL | Status: DC | PRN

## 2013-04-03 MED ORDER — DEXAMETHASONE SODIUM PHOSPHATE 10 MG/ML IJ SOLN
10.0000 mg | Freq: Once | INTRAMUSCULAR | Status: AC
  Administered 2013-04-03: 10 mg via INTRAVENOUS

## 2013-04-03 MED ORDER — FENTANYL CITRATE 0.05 MG/ML IJ SOLN
INTRAMUSCULAR | Status: DC | PRN
  Administered 2013-04-03 (×2): 100 ug via INTRAVENOUS
  Administered 2013-04-03: 50 ug via INTRAVENOUS

## 2013-04-03 MED ORDER — MEPERIDINE HCL 50 MG/ML IJ SOLN: 6.2500 mg | INTRAMUSCULAR | Status: DC | PRN

## 2013-04-03 MED ORDER — ONDANSETRON HCL 4 MG/2ML IJ SOLN: 4.0000 mg | Freq: Four times a day (QID) | INTRAMUSCULAR | Status: DC | PRN

## 2013-04-03 MED ORDER — DILTIAZEM HCL ER COATED BEADS 360 MG PO CP24
360.0000 mg | ORAL_CAPSULE | Freq: Every morning | ORAL | Status: DC
  Administered 2013-04-04 – 2013-04-05 (×2): 360 mg via ORAL
  Filled 2013-04-03 (×2): qty 1

## 2013-04-03 MED ORDER — GLYCOPYRROLATE 0.2 MG/ML IJ SOLN
INTRAMUSCULAR | Status: DC | PRN
  Administered 2013-04-03: 0.4 mg via INTRAVENOUS

## 2013-04-03 MED ORDER — KETOROLAC TROMETHAMINE 15 MG/ML IJ SOLN
7.5000 mg | Freq: Four times a day (QID) | INTRAMUSCULAR | Status: AC | PRN
  Administered 2013-04-03: 7.5 mg via INTRAVENOUS

## 2013-04-03 MED ORDER — FUROSEMIDE 40 MG PO TABS
40.0000 mg | ORAL_TABLET | Freq: Every morning | ORAL | Status: DC
  Administered 2013-04-04 – 2013-04-05 (×2): 40 mg via ORAL
  Filled 2013-04-03 (×2): qty 1

## 2013-04-03 MED ORDER — CEFAZOLIN SODIUM-DEXTROSE 2-3 GM-% IV SOLR
2.0000 g | INTRAVENOUS | Status: AC
  Administered 2013-04-03: 2 g via INTRAVENOUS

## 2013-04-03 MED ORDER — FENTANYL CITRATE 0.05 MG/ML IJ SOLN: 25.0000 ug | INTRAMUSCULAR | Status: DC | PRN

## 2013-04-03 MED ORDER — BUPIVACAINE HCL (PF) 0.25 % IJ SOLN
INTRAMUSCULAR | Status: AC
  Filled 2013-04-03: qty 30

## 2013-04-03 MED ORDER — RIVAROXABAN 10 MG PO TABS: 10.0000 mg | ORAL_TABLET | Freq: Every day | ORAL | Status: DC

## 2013-04-03 MED ORDER — ONDANSETRON HCL 4 MG/2ML IJ SOLN
INTRAMUSCULAR | Status: AC
  Filled 2013-04-03: qty 2

## 2013-04-03 MED ORDER — CEFAZOLIN SODIUM 1-5 GM-% IV SOLN
1.0000 g | Freq: Four times a day (QID) | INTRAVENOUS | Status: AC
  Administered 2013-04-03 (×2): 1 g via INTRAVENOUS
  Filled 2013-04-03 (×2): qty 50

## 2013-04-03 MED ORDER — PHENOL 1.4 % MT LIQD: 1.0000 | OROMUCOSAL | Status: DC | PRN

## 2013-04-03 MED ORDER — BUPIVACAINE LIPOSOME 1.3 % IJ SUSP
INTRAMUSCULAR | Status: DC | PRN
  Administered 2013-04-03: 20 mL

## 2013-04-03 MED ORDER — PREDNISONE 5 MG PO TABS: 5.0000 mg | ORAL_TABLET | Freq: Every day | ORAL | Status: DC

## 2013-04-03 MED ORDER — SODIUM CHLORIDE 0.9 % IV SOLN: INTRAVENOUS | Status: DC

## 2013-04-03 MED ORDER — 0.9 % SODIUM CHLORIDE (POUR BTL) OPTIME
TOPICAL | Status: DC | PRN
  Administered 2013-04-03: 1000 mL

## 2013-04-03 MED ORDER — PROMETHAZINE HCL 25 MG/ML IJ SOLN: 6.2500 mg | INTRAMUSCULAR | Status: DC | PRN

## 2013-04-03 MED ORDER — PREDNISONE 10 MG PO TABS
10.0000 mg | ORAL_TABLET | Freq: Two times a day (BID) | ORAL | Status: DC
  Administered 2013-04-05: 10 mg via ORAL
  Filled 2013-04-03 (×2): qty 1

## 2013-04-03 MED ORDER — PREDNISONE 20 MG PO TABS
20.0000 mg | ORAL_TABLET | Freq: Two times a day (BID) | ORAL | Status: AC
  Administered 2013-04-04 (×2): 20 mg via ORAL
  Filled 2013-04-03 (×2): qty 1

## 2013-04-03 MED ORDER — BUPROPION HCL ER (SR) 150 MG PO TB12
150.0000 mg | ORAL_TABLET | Freq: Two times a day (BID) | ORAL | Status: DC
  Administered 2013-04-03 – 2013-04-05 (×4): 150 mg via ORAL
  Filled 2013-04-03 (×5): qty 1

## 2013-04-03 MED ORDER — MORPHINE SULFATE 2 MG/ML IJ SOLN: 1.0000 mg | INTRAMUSCULAR | Status: DC | PRN

## 2013-04-03 MED ORDER — ONDANSETRON HCL 4 MG PO TABS: 4.0000 mg | ORAL_TABLET | Freq: Four times a day (QID) | ORAL | Status: DC | PRN

## 2013-04-03 MED ORDER — FLEET ENEMA 7-19 GM/118ML RE ENEM: 1.0000 | ENEMA | Freq: Once | RECTAL | Status: AC | PRN

## 2013-04-03 MED ORDER — ACETAMINOPHEN 500 MG PO TABS
1000.0000 mg | ORAL_TABLET | Freq: Four times a day (QID) | ORAL | Status: AC
  Administered 2013-04-03 – 2013-04-04 (×3): 1000 mg via ORAL
  Filled 2013-04-03 (×4): qty 2

## 2013-04-03 MED ORDER — MENTHOL 3 MG MT LOZG: 1.0000 | LOZENGE | OROMUCOSAL | Status: DC | PRN

## 2013-04-03 MED ORDER — LACTATED RINGERS IV SOLN: INTRAVENOUS | Status: DC

## 2013-04-03 MED ORDER — INSULIN ASPART 100 UNIT/ML ~~LOC~~ SOLN
4.0000 [IU] | Freq: Once | SUBCUTANEOUS | Status: AC
  Administered 2013-04-03: 4 [IU] via SUBCUTANEOUS

## 2013-04-03 MED ORDER — METOCLOPRAMIDE HCL 10 MG PO TABS: 5.0000 mg | ORAL_TABLET | Freq: Three times a day (TID) | ORAL | Status: DC | PRN

## 2013-04-03 MED ORDER — METOCLOPRAMIDE HCL 5 MG/ML IJ SOLN: 5.0000 mg | Freq: Three times a day (TID) | INTRAMUSCULAR | Status: DC | PRN

## 2013-04-03 MED ORDER — ROCURONIUM BROMIDE 100 MG/10ML IV SOLN
INTRAVENOUS | Status: DC | PRN
  Administered 2013-04-03: 40 mg via INTRAVENOUS

## 2013-04-03 MED ORDER — HYDROMORPHONE HCL PF 1 MG/ML IJ SOLN
INTRAMUSCULAR | Status: DC | PRN
  Administered 2013-04-03 (×3): 0.5 mg via INTRAVENOUS

## 2013-04-03 MED ORDER — LIDOCAINE HCL (PF) 2 % IJ SOLN
INTRAMUSCULAR | Status: DC | PRN
  Administered 2013-04-03: 7 mg via INTRADERMAL

## 2013-04-03 MED ORDER — DIPHENHYDRAMINE HCL 12.5 MG/5ML PO ELIX: 12.5000 mg | ORAL_SOLUTION | ORAL | Status: DC | PRN

## 2013-04-03 MED ORDER — ACETAMINOPHEN 325 MG PO TABS: 650.0000 mg | ORAL_TABLET | Freq: Four times a day (QID) | ORAL | Status: DC | PRN

## 2013-04-03 MED ORDER — PANTOPRAZOLE SODIUM 40 MG PO TBEC
80.0000 mg | DELAYED_RELEASE_TABLET | Freq: Every day | ORAL | Status: DC
  Administered 2013-04-04: 80 mg via ORAL
  Filled 2013-04-03 (×2): qty 2

## 2013-04-03 MED ORDER — METHOCARBAMOL 100 MG/ML IJ SOLN
500.0000 mg | Freq: Four times a day (QID) | INTRAVENOUS | Status: DC | PRN
  Administered 2013-04-03: 500 mg via INTRAVENOUS
  Filled 2013-04-03: qty 5

## 2013-04-03 MED ORDER — METHOCARBAMOL 500 MG PO TABS
500.0000 mg | ORAL_TABLET | Freq: Four times a day (QID) | ORAL | Status: DC | PRN
  Administered 2013-04-04: 08:00:00 500 mg via ORAL
  Filled 2013-04-03: qty 1

## 2013-04-03 SURGICAL SUPPLY — 40 items
BAG SPEC THK2 15X12 ZIP CLS (MISCELLANEOUS) ×2
BAG ZIPLOCK 12X15 (MISCELLANEOUS) ×4 IMPLANT
BLADE SAW SGTL 18X1.27X75 (BLADE) ×2 IMPLANT
CAPT HIP PF COP ×1 IMPLANT
DECANTER SPIKE VIAL GLASS SM (MISCELLANEOUS) ×2 IMPLANT
DRAPE C-ARM 42X120 X-RAY (DRAPES) ×2 IMPLANT
DRAPE STERI IOBAN 125X83 (DRAPES) ×2 IMPLANT
DRAPE U-SHAPE 47X51 STRL (DRAPES) ×6 IMPLANT
DRSG ADAPTIC 3X8 NADH LF (GAUZE/BANDAGES/DRESSINGS) ×2 IMPLANT
DRSG MEPILEX BORDER 4X4 (GAUZE/BANDAGES/DRESSINGS) ×2 IMPLANT
DRSG MEPILEX BORDER 4X8 (GAUZE/BANDAGES/DRESSINGS) ×2 IMPLANT
DURAPREP 26ML APPLICATOR (WOUND CARE) ×2 IMPLANT
ELECT BLADE 6.5 EXT (BLADE) ×2 IMPLANT
ELECT REM PT RETURN 9FT ADLT (ELECTROSURGICAL) ×2
ELECTRODE REM PT RTRN 9FT ADLT (ELECTROSURGICAL) ×1 IMPLANT
EVACUATOR 1/8 PVC DRAIN (DRAIN) ×2 IMPLANT
FACESHIELD LNG OPTICON STERILE (SAFETY) ×8 IMPLANT
GLOVE BIO SURGEON STRL SZ7.5 (GLOVE) ×2 IMPLANT
GLOVE BIO SURGEON STRL SZ8 (GLOVE) ×4 IMPLANT
GLOVE BIOGEL PI IND STRL 8 (GLOVE) ×2 IMPLANT
GLOVE BIOGEL PI INDICATOR 8 (GLOVE) ×2
GOWN PREVENTION PLUS LG XLONG (DISPOSABLE) ×2 IMPLANT
GOWN STRL REIN XL XLG (GOWN DISPOSABLE) ×2 IMPLANT
KIT BASIN OR (CUSTOM PROCEDURE TRAY) ×2 IMPLANT
NDL SAFETY ECLIPSE 18X1.5 (NEEDLE) ×2 IMPLANT
NEEDLE HYPO 18GX1.5 SHARP (NEEDLE) ×4
PACK TOTAL JOINT (CUSTOM PROCEDURE TRAY) ×2 IMPLANT
PADDING CAST COTTON 6X4 STRL (CAST SUPPLIES) ×2 IMPLANT
SPONGE GAUZE 4X4 12PLY (GAUZE/BANDAGES/DRESSINGS) IMPLANT
STRIP CLOSURE SKIN 1/2X4 (GAUZE/BANDAGES/DRESSINGS) ×3 IMPLANT
SUCTION FRAZIER 12FR DISP (SUCTIONS) IMPLANT
SUT ETHIBOND NAB CT1 #1 30IN (SUTURE) ×2 IMPLANT
SUT MNCRL AB 4-0 PS2 18 (SUTURE) ×2 IMPLANT
SUT VIC AB 2-0 CT1 27 (SUTURE) ×4
SUT VIC AB 2-0 CT1 TAPERPNT 27 (SUTURE) ×2 IMPLANT
SUT VLOC 180 0 24IN GS25 (SUTURE) ×2 IMPLANT
SYR 20CC LL (SYRINGE) ×2 IMPLANT
SYR 50ML LL SCALE MARK (SYRINGE) ×2 IMPLANT
TOWEL OR 17X26 10 PK STRL BLUE (TOWEL DISPOSABLE) ×2 IMPLANT
TRAY FOLEY CATH 14FRSI W/METER (CATHETERS) ×2 IMPLANT

## 2013-04-03 NOTE — Progress Notes (Signed)
Utilization review completed.  

## 2013-04-03 NOTE — Anesthesia Postprocedure Evaluation (Signed)
  Anesthesia Post-op Note  Patient: [name]  Procedure(s) Performed: Procedure(s) (LRB): RIGHT TOTAL HIP ARTHROPLASTY ANTERIOR APPROACH (Right)  Patient Location: PACU  Anesthesia Type: General  Level of Consciousness: awake and alert   Airway and Oxygen Therapy: Patient Spontanous Breathing  Post-op Pain: mild  Post-op Assessment: Post-op Vital signs reviewed, Patient's Cardiovascular Status Stable, Respiratory Function Stable, Patent Airway and No signs of Nausea or vomiting  Last Vitals:  Filed Vitals:   04/03/13 1430  BP:   Pulse:   Temp: 36.8 C  Resp:     Post-op Vital Signs: stable   Complications: No apparent anesthesia complications

## 2013-04-03 NOTE — Preoperative (Signed)
Beta Blockers   Reason not to administer Beta Blockers:Took Metoprolol this am. 

## 2013-04-03 NOTE — Transfer of Care (Signed)
Immediate Anesthesia Transfer of Care Note  Patient: [name]  Procedure(s) Performed: Procedure(s): RIGHT TOTAL HIP ARTHROPLASTY ANTERIOR APPROACH (Right)  Patient Location: PACU  Anesthesia Type:General  Level of Consciousness: awake, sedated and patient cooperative  Airway & Oxygen Therapy: Patient Spontanous Breathing and Patient connected to face mask oxygen  Post-op Assessment: Report given to PACU RN and Post -op Vital signs reviewed and stable  Post vital signs: Reviewed and stable  Complications: No apparent anesthesia complications

## 2013-04-03 NOTE — Anesthesia Preprocedure Evaluation (Addendum)
Anesthesia Evaluation  Patient identified by MRN, date of birth, ID band Patient awake    Reviewed: Allergy & Precautions, H&P , NPO status , Patient's Chart, lab work & pertinent test results  Airway Mallampati: III TM Distance: >3 FB Neck ROM: Full    Dental no notable dental hx.    Pulmonary neg pulmonary ROS,  breath sounds clear to auscultation  Pulmonary exam normal       Cardiovascular hypertension, Pt. on medications + dysrhythmias Atrial Fibrillation Rhythm:Regular Rate:Normal     Neuro/Psych negative neurological ROS  negative psych ROS   GI/Hepatic negative GI ROS, Neg liver ROS,   Endo/Other  diabetes, Type 2, Oral Hypoglycemic Agents  Renal/GU negative Renal ROS  negative genitourinary   Musculoskeletal negative musculoskeletal ROS (+) Arthritis -, Rheumatoid disorders,    Abdominal   Peds negative pediatric ROS (+)  Hematology negative hematology ROS (+)   Anesthesia Other Findings   Reproductive/Obstetrics negative OB ROS                          Anesthesia Physical Anesthesia Plan  ASA: III  Anesthesia Plan: General   Post-op Pain Management:    Induction: Intravenous  Airway Management Planned: Oral ETT  Additional Equipment:   Intra-op Plan:   Post-operative Plan: Extubation in OR  Informed Consent: I have reviewed the patients History and Physical, chart, labs and discussed the procedure including the risks, benefits and alternatives for the proposed anesthesia with the patient or authorized representative who has indicated his/her understanding and acceptance.   Dental advisory given  Plan Discussed with: CRNA  Anesthesia Plan Comments: (Pt refuses sab)        Anesthesia Quick Evaluation

## 2013-04-03 NOTE — Op Note (Signed)
OPERATIVE REPORT  PREOPERATIVE DIAGNOSIS: Osteoarthritis of the Right hip.   POSTOPERATIVE DIAGNOSIS: Osteoarthritis of the Right  hip.   PROCEDURE: Right total hip arthroplasty, anterior approach.   SURGEON: Ollen Gross, MD   ASSISTANT: Avel Peace, PA-C  ANESTHESIA:  General  ESTIMATED BLOOD LOSS:- 100 ml  DRAINS: Hemovac x1.   COMPLICATIONS: None   CONDITION: PACU - hemodynamically stable.   BRIEF CLINICAL NOTE: [name] is a 67 y.o. female who has advanced end-  stage arthritis of his Right  hip with progressively worsening pain and  dysfunction.The patient has failed nonoperative management and presents for  total hip arthroplasty.   PROCEDURE IN DETAIL: After successful administration of spinal  anesthetic, the traction boots for the [facility] bed were placed on both  feet and the patient was placed onto the [facility] bed, boots placed into the leg  holders. The Right hip was then isolated from the perineum with plastic  drapes and prepped and draped in the usual sterile fashion. ASIS and  greater trochanter were marked and a oblique incision was made, starting  at about 1 cm lateral and 2 cm distal to the ASIS and coursing towards  the anterior cortex of the femur. The skin was cut with a 10 blade  through subcutaneous tissue to the level of the fascia overlying the  tensor fascia lata muscle. The fascia was then incised in line with the  incision at the junction of the anterior third and posterior 2/3rd. The  muscle was teased off the fascia and then the interval between the TFL  and the rectus was developed. The Hohmann retractor was then placed at  the top of the femoral neck over the capsule. The vessels overlying the  capsule were cauterized and the fat on top of the capsule was removed.  A Hohmann retractor was then placed anterior underneath the rectus  femoris to give exposure to the entire anterior capsule. A T-shaped  capsulotomy was performed.  The edges were tagged and the femoral head  was identified.       Osteophytes are removed off the superior acetabulum.  The femoral neck was then cut in situ with an oscillating saw. Traction  was then applied to the left lower extremity utilizing the [facility]  traction. The femoral head was then removed. Retractors were placed  around the acetabulum and then circumferential removal of the labrum was  performed. Osteophytes were also removed. Reaming starts at 45 mm to  medialize and  Increased in 2 mm increments to 51 mm. We reamed in  approximately 40 degrees of abduction, 20 degrees anteversion. A 52 mm  pinnacle acetabular shell was then impacted in anatomic position under  fluoroscopic guidance with excellent purchase. We did not need to place  any additional dome screws. A 32 mm neutral + 4 marathon liner was then  placed into the acetabular shell.       The femoral lift was then placed along the lateral aspect of the femur  just distal to the vastus ridge. The leg was  externally rotated and capsule  was stripped off the inferior aspect of the femoral neck down to the  level of the lesser trochanter, this was done with electrocautery. The femur was lifted after this was performed. The  leg was then placed and extended in adducted position to essentially delivering the femur. We also removed the capsule superiorly and the  piriformis from the piriformis  fossa to gain excellent exposure of the  proximal femur. Rongeur was used to remove some cancellous bone to get  into the lateral portion of the proximal femur for placement of the  initial starter reamer. The starter broaches was placed  the starter broach  and was shown to go down the center of the canal. Broaching  with the  Corail system was then performed starting at size 8, coursing  Up to size 10. A size 10 had excellent torsional and rotational  and axial stability. The trial standard offset neck was then placed  with a 32 + 1  trial head. The hip was then reduced. We confirmed that  the stem was in the canal both on AP and lateral x-rays. It also has excellent sizing. The hip was reduced with outstanding stability through full extension, full external rotation,  and then flexion in adduction internal rotation. AP pelvis was taken  and the leg lengths were measured and found to be exactly equal. Hip  was then dislocated again and the femoral head and neck removed. The  femoral broach was removed. Size 10 Corail stem with a standard offset  neck was then impacted into the femur following native anteversion. Has  excellent purchase in the canal. Excellent torsional and rotational and  axial stability. It is confirmed to be in the canal on AP and lateral  fluoroscopic views. The 32 + 1 ceramic head was placed and the hip  reduced with outstanding stability. Again AP pelvis was taken and it  confirmed that the leg lengths were equal. The wound was then copiously  irrigated with saline solution and the capsule reattached and repaired  with Ethibond suture.  20 mL of Exparel mixed with 50 mL of saline then additional 20 ml of .25% Bupivicaine injected into the capsule and into the edge of the tensor fascia lata as well as subcutaneous tissue. The fascia overlying the tensor fascia lata was  then closed with a running #1 V-Loc. Subcu was closed with interrupted  2-0 Vicryl and subcuticular running 4-0 Monocryl. Incision was cleaned  and dried. Steri-Strips and a bulky sterile dressing applied. Hemovac  drain was hooked to suction and then he was awakened and transported to  recovery in stable condition.        Please note that a surgical assistant was a medical necessity for this procedure to perform it in a safe and expeditious manner. Assistant was necessary to provide appropriate retraction of vital neurovascular structures and to prevent femoral fracture and allow for anatomic placement of the prosthesis.  Gaynelle Arabian,  M.D.

## 2013-04-03 NOTE — Interval H&P Note (Signed)
History and Physical Interval Note:  04/03/2013 11:17 AM  [name]  has presented today for surgery, with the diagnosis of OA RIGHT HIP  The various methods of treatment have been discussed with the patient and family. After consideration of risks, benefits and other options for treatment, the patient has consented to  Procedure(s): RIGHT TOTAL HIP ARTHROPLASTY ANTERIOR APPROACH (Right) as a surgical intervention .  The patient's history has been reviewed, patient examined, no change in status, stable for surgery.  I have reviewed the patient's chart and labs.  Questions were answered to the patient's satisfaction.     Loanne Drilling

## 2013-04-04 LAB — CBC
HCT: 37.6 % (ref 36.0–46.0)
Hemoglobin: 12 g/dL (ref 12.0–15.0)
MCH: 26.5 pg (ref 26.0–34.0)
MCHC: 31.9 g/dL (ref 30.0–36.0)
MCV: 83.2 fL (ref 78.0–100.0)
Platelets: 307 10*3/uL (ref 150–400)
RBC: 4.52 MIL/uL (ref 3.87–5.11)
RDW: 14.9 % (ref 11.5–15.5)
WBC: 12.3 10*3/uL — ABNORMAL HIGH (ref 4.0–10.5)

## 2013-04-04 LAB — GLUCOSE, CAPILLARY
GLUCOSE-CAPILLARY: 294 mg/dL — AB (ref 70–99)
GLUCOSE-CAPILLARY: 313 mg/dL — AB (ref 70–99)
Glucose-Capillary: 259 mg/dL — ABNORMAL HIGH (ref 70–99)
Glucose-Capillary: 242 mg/dL — ABNORMAL HIGH (ref 70–99)

## 2013-04-04 LAB — BASIC METABOLIC PANEL
BUN: 12 mg/dL (ref 6–23)
CO2: 26 mEq/L (ref 19–32)
Calcium: 9 mg/dL (ref 8.4–10.5)
Chloride: 98 mEq/L (ref 96–112)
Creatinine, Ser: 0.52 mg/dL (ref 0.50–1.10)
GFR calc Af Amer: >90 mL/min (ref >90)
GFR calc non Af Amer: >90 mL/min (ref >90)
Glucose, Bld: 339 mg/dL — ABNORMAL HIGH (ref 70–99)
Potassium: 4.4 mEq/L (ref 3.7–5.3)
Sodium: 135 mEq/L — ABNORMAL LOW (ref 137–147)

## 2013-04-04 NOTE — Progress Notes (Signed)
Physical Therapy Treatment Patient Details Name: [name] MRN: [id number] DOB: Mar 18, 1946 Today's Date: 04/04/2013 Time: 5427-0623 PT Time Calculation (min): 23 min  PT Assessment / Plan / Recommendation  History of Present Illness Right total hip arthroplasty, anterior approach   PT Comments     Follow Up Recommendations  Home health PT     Does the patient have the potential to tolerate intense rehabilitation     Barriers to Discharge        Equipment Recommendations  None recommended by PT    Recommendations for Other Services OT consult  Frequency 7X/week   Progress towards PT Goals Progress towards PT goals: Progressing toward goals  Plan Current plan remains appropriate    Precautions / Restrictions Precautions Precautions: Fall Restrictions Weight Bearing Restrictions: No Other Position/Activity Restrictions: WBAT   Pertinent Vitals/Pain 3/10; ice pack provided    Mobility  Bed Mobility Bed Mobility: Sit to Supine Sit to Supine: 4: Min guard Details for Bed Mobility Assistance: cues for sequence and use of L LE to self assist Transfers Transfers: Sit to Stand;Stand to Sit Sit to Stand: 4: Min guard;With upper extremity assist;From chair/3-in-1 Stand to Sit: 4: Min guard;With upper extremity assist;To bed Details for Transfer Assistance: verbal cues for hand placement Ambulation/Gait Ambulation/Gait Assistance: 4: Min assist Assistive device: Rolling walker Ambulation/Gait Assistance Details: cues for posture and position from RW Gait Pattern: Step-to pattern;Step-through pattern;Antalgic;Decreased step length - right;Decreased step length - left;Shuffle    Exercises Total Joint Exercises Ankle Circles/Pumps: AROM;15 reps;Both;Supine Quad Sets: AROM;Both;10 reps;Supine Heel Slides: AAROM;20 reps;Supine;Right Hip ABduction/ADduction: AAROM;Right;15 reps;Supine   PT Diagnosis:    PT Problem List:   PT Treatment Interventions:     PT Goals  (current goals can now be found in the care plan section) Acute Rehab PT Goals Patient Stated Goal: home when ready PT Goal Formulation: With patient Time For Goal Achievement: 04/11/13 Potential to Achieve Goals: Good  Visit Information  Last PT Received On: 04/04/13 Assistance Needed: +1 History of Present Illness: Right total hip arthroplasty, anterior approach    Subjective Data  Patient Stated Goal: home when ready   Cognition  Cognition Arousal/Alertness: Awake/alert Behavior During Therapy: WFL for tasks assessed/performed Overall Cognitive Status: Within Functional Limits for tasks assessed    Balance  Balance Balance Assessed: Yes Dynamic Standing Balance Dynamic Standing - Level of Assistance: 4: Min assist (min guard)  End of Session PT - End of Session Equipment Utilized During Treatment: Gait belt Activity Tolerance: Patient tolerated treatment well Patient left: in bed;with call bell/phone within reach;with family/visitor present Nurse Communication: Mobility status   GP     Clemens Lachman 04/04/2013, 2:58 PM

## 2013-04-04 NOTE — Progress Notes (Addendum)
   CARE MANAGEMENT NOTE 04/04/2013  Patient:  [name], [name]   Account Number:  [id number]  Date Initiated:  04/04/2013  Documentation initiated by:  [facility]  Subjective/Objective Assessment:     Action/Plan:   Anticipated DC Date:  04/04/2013   Anticipated DC Plan:  [place]  CM consult      [facility] Choice  HOME HEALTH   Choice offered to / List presented to:  C-1 Patient   DME arranged  3-N-1      DME agency  [place] arranged  HH-2 PT      [place]   Status of service:  Completed, signed off Medicare Important Message given?   (If response is "NO", the following Medicare IM given date fields will be blank) Date Medicare IM given:   Date Additional Medicare IM given:    Discharge Disposition:  [place]  Per UR Regulation:    If discussed at Long Length of Stay Meetings, dates discussed:    Comments:  04/04/2013 1130 NCM spoke to pt and agreeable to [place] for [facility]. States she has RW and cane at home. Will need 3n1 for home.  Ordered 3n1 from [facility] to deliver to room prior to dc.Jonnie Finner RN CCM Case Mgmt phone [phone]

## 2013-04-04 NOTE — Evaluation (Signed)
Physical Therapy Evaluation Patient Details Name: [name] MRN: [id number] DOB: 03/15/1946 Today's Date: 04/04/2013 Time: 0940-1003 PT Time Calculation (min): 23 min  PT Assessment / Plan / Recommendation History of Present Illness     Clinical Impression  Pt s/p R THR presents with decreased R LE strength/ROM and post op discomfort limiting functional mobility.  Pt should progress well to d/c home with HHPT follow up and intermittent assist of neighbors/friends.    PT Assessment  Patient needs continued PT services    Follow Up Recommendations  Home health PT    Does the patient have the potential to tolerate intense rehabilitation      Barriers to Discharge        Equipment Recommendations  None recommended by PT    Recommendations for Other Services OT consult   Frequency 7X/week    Precautions / Restrictions Precautions Precautions: Fall Restrictions Weight Bearing Restrictions: No Other Position/Activity Restrictions: WBAT   Pertinent Vitals/Pain 2/10; premed, ice pack provided      Mobility  Bed Mobility Bed Mobility: Supine to Sit Supine to Sit: 4: Min assist;HOB elevated Details for Bed Mobility Assistance: cues for sequence and use of L LE to self assist Transfers Transfers: Sit to Stand;Stand to Sit Sit to Stand: 4: Min assist;3: Mod assist Stand to Sit: 4: Min assist Details for Transfer Assistance: cues for posture, sequence and position from RW Ambulation/Gait Ambulation/Gait Assistance: 4: Min assist Ambulation Distance (Feet): 130 Feet (and 20) Assistive device: Rolling walker Ambulation/Gait Assistance Details: cues for initial sequence, posture and position from RW Gait Pattern: Step-to pattern;Step-through pattern;Shuffle;Antalgic;Trunk flexed;Decreased step length - right;Decreased step length - left    Exercises     PT Diagnosis: Difficulty walking  PT Problem List: Decreased strength;Decreased range of motion;Decreased activity  tolerance;Decreased mobility;Pain;Decreased knowledge of use of DME PT Treatment Interventions: DME instruction;Gait training;Stair training;Functional mobility training;Therapeutic activities;Therapeutic exercise;Patient/family education     PT Goals(Current goals can be found in the care plan section) Acute Rehab PT Goals Patient Stated Goal: Resume previous lifestyle with decreased pain PT Goal Formulation: With patient Time For Goal Achievement: 04/11/13 Potential to Achieve Goals: Good  Visit Information  Last PT Received On: 04/04/13 Assistance Needed: +1       Prior [place] expects to be discharged to:: Private residence Living Arrangements: Non-relatives/Friends Available Help at Discharge: Friend(s);Available PRN/intermittently Type of Home: House Home Access: Stairs to enter [employer] of Steps: 3 Entrance Stairs-Rails: Right;Left;Can reach both Home Layout: One level Home Equipment: Environmental consultant - 2 wheels Prior Function Level of Independence: Independent Communication Communication: No difficulties    Cognition  Cognition Arousal/Alertness: Awake/alert Behavior During Therapy: WFL for tasks assessed/performed Overall Cognitive Status: Within Functional Limits for tasks assessed    Extremity/Trunk Assessment Upper Extremity Assessment Upper Extremity Assessment: Overall WFL for tasks assessed Lower Extremity Assessment Lower Extremity Assessment: RLE deficits/detail   Balance    End of Session PT - End of Session Equipment Utilized During Treatment: Gait belt Activity Tolerance: Patient tolerated treatment well Patient left: in chair;with call bell/phone within reach Nurse Communication: Mobility status  GP     [name] 04/04/2013, 12:58 PM

## 2013-04-04 NOTE — Evaluation (Signed)
Occupational Therapy Evaluation Patient Details Name: [name] MRN: [id number] DOB: 03-Sep-1945 Today's Date: 04/04/2013 Time: 2202-5427 OT Time Calculation (min): 16 min  OT Assessment / Plan / Recommendation History of present illness Right total hip arthroplasty, anterior approach   Clinical Impression   Pt doing well. Husband present. All education completed and feel pt will progress with functional transfers with PT.     OT Assessment  Patient does not need any further OT services    Follow Up Recommendations  No OT follow up;Supervision/Assistance - 24 hour    Barriers to Discharge      Equipment Recommendations  3 in 1 bedside comode    Recommendations for Other Services    Frequency       Precautions / Restrictions Precautions Precautions: Fall Restrictions Weight Bearing Restrictions: No Other Position/Activity Restrictions: WBAT   Pertinent Vitals/Pain 1/10 R hip; reposition, ice    ADL  Eating/Feeding: Simulated;Independent Where Assessed - Eating/Feeding: Chair Grooming: Simulated;Wash/dry hands;Set up Where Assessed - Grooming: Supported sitting Upper Body Bathing: Simulated;Chest;Right arm;Left arm;Abdomen;Set up Where Assessed - Upper Body Bathing: Unsupported sitting Lower Body Bathing: Simulated;Min guard Where Assessed - Lower Body Bathing: Supported sit to stand Upper Body Dressing: Simulated;Set up Where Assessed - Upper Body Dressing: Unsupported sitting Lower Body Dressing: Simulated;Min guard Where Assessed - Lower Body Dressing: Supported sit to stand Toilet Transfer: Performed;Min Conservator, museum/gallery: Raised toilet seat with arms (or 3-in-1 over toilet) Toileting - Clothing Manipulation and Hygiene: Simulated;Min guard Where Assessed - Best boy and Hygiene: Sit to stand from 3-in-1 or toilet Equipment Used: Rolling walker ADL Comments: Husband present for session. Pt states she has a tub and a seat  that she plans to have on outside of tub and turn to inside of tub and use grab bar to stand up. She states her seat wont fit inside of tub well. Encouraged pt to let [facility] assess tub transfer with the method she has in mind before tryign it on her own. Pt verbalized understanding to let [facility] assess tub transfer. She did say this is how she transferred into tub 5 years ago with previous surgery. She can reach down to touch feet today on eval. Husband can help PRN.    OT Diagnosis:    OT Problem List:   OT Treatment Interventions:     OT Goals(Current goals can be found in the care plan section) Acute Rehab OT Goals Patient Stated Goal: home when ready  Visit Information  Last OT Received On: 04/04/13 Assistance Needed: +1 History of Present Illness: Right total hip arthroplasty, anterior approach       Prior [place] expects to be discharged to:: Private residence Living Arrangements: Non-relatives/Friends Available Help at Discharge: Friend(s);Available PRN/intermittently Type of Home: House Home Access: Stairs to enter [employer] of Steps: 3 Entrance Stairs-Rails: Right;Left;Can reach both Home Layout: One level Home Equipment: Walker - 2 wheels Prior Function Level of Independence: Independent Communication Communication: No difficulties         Vision/Perception     Cognition  Cognition Arousal/Alertness: Awake/alert Behavior During Therapy: WFL for tasks assessed/performed Overall Cognitive Status: Within Functional Limits for tasks assessed    Extremity/Trunk Assessment Upper Extremity Assessment Upper Extremity Assessment: Overall WFL for tasks assessed     Mobility  Transfers Transfers: Sit to Stand;Stand to Sit Sit to Stand: 4: Min guard;With upper extremity assist;From chair/3-in-1 Stand to Sit: 4: Min guard;With upper extremity assist;To  chair/3-in-1 Details for Transfer Assistance: verbal cues for hand  placement     Exercise     Balance Balance Balance Assessed: Yes Dynamic Standing Balance Dynamic Standing - Level of Assistance: 4: Min assist (min guard)   End of Session OT - End of Session Equipment Utilized During Treatment: Rolling walker Activity Tolerance: Patient tolerated treatment well Patient left: in chair;with call bell/phone within reach;with family/visitor present  GO     Jules Schick [phone] 04/04/2013, 1:18 PM

## 2013-04-04 NOTE — Progress Notes (Signed)
   Subjective: 1 Day Post-Op Procedure(s) (LRB): RIGHT TOTAL HIP ARTHROPLASTY ANTERIOR APPROACH (Right)   Patient reports pain as mild, pain controlled. No events throughout the night.   Objective:   VITALS:   Filed Vitals:   04/04/13 0654  BP: 121/82  Pulse: 88  Temp: 98 F (36.7 C)  Resp: 16    Neurovascular intact Dorsiflexion/Plantar flexion intact Incision: dressing C/D/I No cellulitis present Compartment soft  LABS  Recent Labs  04/04/13 0454  HGB 12.0  HCT 37.6  WBC 12.3*  PLT 307     Recent Labs  04/04/13 0454  NA 135*  K 4.4  BUN 12  CREATININE 0.52  GLUCOSE 339*     Assessment/Plan: 1 Day Post-Op Procedure(s) (LRB): RIGHT TOTAL HIP ARTHROPLASTY ANTERIOR APPROACH (Right) HV drain d/c'ed  Advance diet Up with therapy D/C IV fluids Discharge home with home health eventually, when ready     [place]. Cora Stetson   PAC  04/04/2013, 9:29 AM

## 2013-04-05 ENCOUNTER — Encounter (HOSPITAL_COMMUNITY): Payer: Self-pay | Admitting: Orthopedic Surgery

## 2013-04-05 LAB — BASIC METABOLIC PANEL
BUN: 15 mg/dL (ref 6–23)
CALCIUM: 9.2 mg/dL (ref 8.4–10.5)
CO2: 27 mEq/L (ref 19–32)
Chloride: 99 mEq/L (ref 96–112)
Creatinine, Ser: 0.52 mg/dL (ref 0.50–1.10)
GFR calc Af Amer: >90 mL/min (ref >90)
GFR calc non Af Amer: >90 mL/min (ref >90)
GLUCOSE: 299 mg/dL — AB (ref 70–99)
Potassium: 4 mEq/L (ref 3.7–5.3)
Sodium: 139 mEq/L (ref 137–147)

## 2013-04-05 LAB — CBC
HCT: 34.4 % — ABNORMAL LOW (ref 36.0–46.0)
HEMOGLOBIN: 11 g/dL — AB (ref 12.0–15.0)
MCH: 26.6 pg (ref 26.0–34.0)
MCHC: 32 g/dL (ref 30.0–36.0)
MCV: 83.1 fL (ref 78.0–100.0)
Platelets: 327 10*3/uL (ref 150–400)
RBC: 4.14 MIL/uL (ref 3.87–5.11)
RDW: 15.1 % (ref 11.5–15.5)
WBC: 14.7 10*3/uL — ABNORMAL HIGH (ref 4.0–10.5)

## 2013-04-05 LAB — GLUCOSE, CAPILLARY
GLUCOSE-CAPILLARY: 244 mg/dL — AB (ref 70–99)
Glucose-Capillary: 269 mg/dL — ABNORMAL HIGH (ref 70–99)

## 2013-04-05 MED ORDER — OXYCODONE HCL 5 MG PO TABS: 5.0000 mg | ORAL_TABLET | ORAL | Status: DC | PRN

## 2013-04-05 MED ORDER — ASPIRIN EC 325 MG PO TBEC: 325.0000 mg | DELAYED_RELEASE_TABLET | Freq: Every day | ORAL | Status: DC

## 2013-04-05 MED ORDER — PREDNISONE 5 MG PO TABS: 5.0000 mg | ORAL_TABLET | Freq: Every day | ORAL | Status: DC

## 2013-04-05 MED ORDER — METHOCARBAMOL 500 MG PO TABS: 500.0000 mg | ORAL_TABLET | Freq: Four times a day (QID) | ORAL | Status: DC | PRN

## 2013-04-05 MED ORDER — TRAMADOL HCL 50 MG PO TABS: 50.0000 mg | ORAL_TABLET | Freq: Four times a day (QID) | ORAL | Status: DC | PRN

## 2013-04-05 NOTE — Progress Notes (Signed)
   Subjective: 2 Days Post-Op Procedure(s) (LRB): RIGHT TOTAL HIP ARTHROPLASTY ANTERIOR APPROACH (Right) Patient reports pain as mild.   Patient seen in rounds with Dr. Wynelle Link. Patient is well, and has had no acute complaints or problems Patient is ready to go home  Objective: Vital signs in last 24 hours: Temp:  [97.6 F (36.4 C)-98.2 F (36.8 C)] 97.9 F (36.6 C) (01/02 0717) Pulse Rate:  [87-124] 87 (01/02 0717) Resp:  [16] 16 (01/02 0717) BP: (105-130)/(69-84) 130/84 mmHg (01/02 0717) SpO2:  [94 %-96 %] 96 % (01/02 0717)  Intake/Output from previous day:  Intake/Output Summary (Last 24 hours) at 04/05/13 0836 Last data filed at 04/04/13 2148  Gross per 24 hour  Intake 1306.25 ml  Output   2050 ml  Net -743.75 ml    Intake/Output this shift:    Labs:  Recent Labs  04/04/13 0454 04/05/13 0443  HGB 12.0 11.0*    Recent Labs  04/04/13 0454 04/05/13 0443  WBC 12.3* 14.7*  RBC 4.52 4.14  HCT 37.6 34.4*  PLT 307 327    Recent Labs  04/04/13 0454 04/05/13 0443  NA 135* 139  K 4.4 4.0  CL 98 99  CO2 26 27  BUN 12 15  CREATININE 0.52 0.52  GLUCOSE 339* 299*  CALCIUM 9.0 9.2   No results found for this basename: LABPT, INR,  in the last 72 hours  EXAM: General - Patient is Alert, Appropriate and Oriented Extremity - Neurovascular intact Sensation intact distally Dorsiflexion/Plantar flexion intact Incision - clean, dry, no drainage, healing Motor Function - intact, moving foot and toes well on exam.   Assessment/Plan: 2 Days Post-Op Procedure(s) (LRB): RIGHT TOTAL HIP ARTHROPLASTY ANTERIOR APPROACH (Right) Procedure(s) (LRB): RIGHT TOTAL HIP ARTHROPLASTY ANTERIOR APPROACH (Right) Past Medical History  Diagnosis Date  . Chest pain   . Atrial fibrillation   . Anemia     hx of  . Hypertension   . Diabetes mellitus   . Rheumatoid arthritis(714.0)   . Anxiety and depression   . Gastroesophageal reflux disease   . Hyperlipidemia   .  Hepatitis 1970  . Jaundice     age 68   Principal Problem:   OA (osteoarthritis) of hip  Estimated body mass index is 26.78 kg/(m^2) as calculated from the following:   Height as of this encounter: 5\' 7"  (1.702 m).   Weight as of this encounter: 77.565 kg (171 lb). Up with therapy Discharge home with home health Diet - Cardiac diet and Diabetic diet Follow up - in 2 weeks Activity - WBAT Disposition - Home Condition Upon Discharge - Good D/C Meds - See DC Summary DVT Prophylaxis - Lovenox  [name] 04/05/2013, 8:36 AM

## 2013-04-05 NOTE — Progress Notes (Signed)
Physical Therapy Treatment Patient Details Name: JOCEE KISSICK MRN: [id number] DOB: 1946-02-17 Today's Date: 04/05/2013 Time: 0630-1601 PT Time Calculation (min): 29 min  PT Assessment / Plan / Recommendation  History of Present Illness Right total hip arthroplasty, anterior approach   PT Comments   Reviewed stairs and car transfers with pt.  Follow Up Recommendations  Home health PT     Does the patient have the potential to tolerate intense rehabilitation     Barriers to Discharge        Equipment Recommendations  None recommended by PT    Recommendations for Other Services OT consult  Frequency 7X/week   Progress towards PT Goals Progress towards PT goals: Progressing toward goals  Plan Current plan remains appropriate    Precautions / Restrictions Precautions Precautions: Fall Restrictions Weight Bearing Restrictions: No Other Position/Activity Restrictions: WBAT   Pertinent Vitals/Pain 3/10; premed, ice pack provided    Mobility  Bed Mobility Bed Mobility: Supine to Sit;Sit to Supine Supine to Sit: 5: Supervision Sit to Supine: 5: Supervision Details for Bed Mobility Assistance: cues for sequence and use of L LE to self assist Transfers Transfers: Sit to Stand;Stand to Sit Sit to Stand: 5: Supervision Stand to Sit: 5: Supervision Details for Transfer Assistance: verbal cues for hand placement Ambulation/Gait Ambulation/Gait Assistance: 5: Supervision Ambulation Distance (Feet): 240 Feet Assistive device: Rolling walker Ambulation/Gait Assistance Details: min cues for posture and position from RW Gait Pattern: Antalgic;Shuffle;Step-through pattern;Step-to pattern Stairs: Yes Stairs Assistance: 4: Min guard Stair Management Technique: Two rails;Forwards Number of Stairs: 5    Exercises Total Joint Exercises Ankle Circles/Pumps: AROM;15 reps;Both;Supine Quad Sets: AROM;Both;10 reps;Supine Heel Slides: AAROM;20 reps;Supine;Right Hip ABduction/ADduction:  AAROM;Right;15 reps;Supine   PT Diagnosis:    PT Problem List:   PT Treatment Interventions:     PT Goals (current goals can now be found in the care plan section) Acute Rehab PT Goals Patient Stated Goal: home when ready PT Goal Formulation: With patient Time For Goal Achievement: 04/11/13 Potential to Achieve Goals: Good  Visit Information  Last PT Received On: 04/05/13 Assistance Needed: +1 History of Present Illness: Right total hip arthroplasty, anterior approach    Subjective Data  Patient Stated Goal: home when ready   Cognition  Cognition Arousal/Alertness: Awake/alert Behavior During Therapy: WFL for tasks assessed/performed Overall Cognitive Status: Within Functional Limits for tasks assessed    Balance     End of Session PT - End of Session Equipment Utilized During Treatment: Gait belt Activity Tolerance: Patient tolerated treatment well Patient left: in chair;with call bell/phone within reach Nurse Communication: Mobility status   GP     Kehinde Totzke 04/05/2013, 10:50 AM

## 2013-04-05 NOTE — Discharge Summary (Signed)
Physician Discharge Summary   Patient ID: [name] MRN: [id number] DOB/AGE: 1945/12/31 68 y.o.  Admit date: 04/03/2013 Discharge date: 04-05-2013  Primary Diagnosis:  Osteoarthritis of the Right hip.   Admission Diagnoses:  Past Medical History  Diagnosis Date  . Chest pain   . Atrial fibrillation   . Anemia     hx of  . Hypertension   . Diabetes mellitus   . Rheumatoid arthritis(714.0)   . Anxiety and depression   . Gastroesophageal reflux disease   . Hyperlipidemia   . Hepatitis 1970  . Jaundice     age 24   Discharge Diagnoses:   Principal Problem:   OA (osteoarthritis) of hip  Estimated body mass index is 26.78 kg/(m^2) as calculated from the following:   Height as of this encounter: _0  (1.702 m).   Weight as of this encounter: 77.565 kg (171 lb).  Procedure(s) (LRB): RIGHT TOTAL HIP ARTHROPLASTY ANTERIOR APPROACH (Right)   Consults: None  HPI: [name] is a 68 y.o. female who has advanced end-  stage arthritis of his Right hip with progressively worsening pain and  dysfunction.The patient has failed nonoperative management and presents for  total hip arthroplasty.   Laboratory Data: Admission on 04/03/2013  Component Date Value Range Status  . ABO/RH(D) 04/03/2013 O NEG   Final  . Antibody Screen 04/03/2013 NEG   Final  . Sample Expiration 04/03/2013 04/06/2013   Final  . Glucose-Capillary 04/03/2013 221* 70 - 99 mg/dL Final  . Comment 1 04/03/2013 Documented in Chart   Final  . Glucose-Capillary 04/03/2013 183* 70 - 99 mg/dL Final  . Comment 1 04/03/2013 Documented in Chart   Final  . Comment 2 04/03/2013 Notify RN   Final  . Glucose-Capillary 04/03/2013 175* 70 - 99 mg/dL Final  . WBC 04/04/2013 12.3* 4.0 - 10.5 K/uL Final  . RBC 04/04/2013 4.52  3.87 - 5.11 MIL/uL Final  . Hemoglobin 04/04/2013 12.0  12.0 - 15.0 g/dL Final  . HCT 04/04/2013 37.6  36.0 - 46.0 % Final  . MCV 04/04/2013 83.2  78.0 - 100.0 fL Final  . MCH 04/04/2013 26.5   26.0 - 34.0 pg Final  . MCHC 04/04/2013 31.9  30.0 - 36.0 g/dL Final  . RDW 04/04/2013 14.9  11.5 - 15.5 % Final  . Platelets 04/04/2013 307  150 - 400 K/uL Final  . Sodium 04/04/2013 135* 137 - 147 mEq/L Final   Please note change in reference range.  . Potassium 04/04/2013 4.4  3.7 - 5.3 mEq/L Final   Please note change in reference range.  . Chloride 04/04/2013 98  96 - 112 mEq/L Final  . CO2 04/04/2013 26  19 - 32 mEq/L Final  . Glucose, Bld 04/04/2013 339* 70 - 99 mg/dL Final  . BUN 04/04/2013 12  6 - 23 mg/dL Final  . Creatinine, Ser 04/04/2013 0.52  0.50 - 1.10 mg/dL Final  . Calcium 04/04/2013 9.0  8.4 - 10.5 mg/dL Final  . GFR calc non Af Amer 04/04/2013 >90  >90 mL/min Final  . GFR calc Af Amer 04/04/2013 >90  >90 mL/min Final   Comment: (NOTE)                          The eGFR has been calculated using the CKD EPI equation.  This calculation has not been validated in all clinical situations.                          eGFR's persistently <90 mL/min signify possible Chronic Kidney                          Disease.  . Glucose-Capillary 04/03/2013 295* 70 - 99 mg/dL Final  . Glucose-Capillary 04/03/2013 353* 70 - 99 mg/dL Final  . Glucose-Capillary 04/04/2013 294* 70 - 99 mg/dL Final  . Comment 1 04/04/2013 Documented in Chart   Final  . Comment 2 04/04/2013 Notify RN   Final  . Glucose-Capillary 04/04/2013 313* 70 - 99 mg/dL Final  . Comment 1 04/04/2013 Notify RN   Final  . WBC 04/05/2013 14.7* 4.0 - 10.5 K/uL Final  . RBC 04/05/2013 4.14  3.87 - 5.11 MIL/uL Final  . Hemoglobin 04/05/2013 11.0* 12.0 - 15.0 g/dL Final  . HCT 04/05/2013 34.4* 36.0 - 46.0 % Final  . MCV 04/05/2013 83.1  78.0 - 100.0 fL Final  . MCH 04/05/2013 26.6  26.0 - 34.0 pg Final  . MCHC 04/05/2013 32.0  30.0 - 36.0 g/dL Final  . RDW 04/05/2013 15.1  11.5 - 15.5 % Final  . Platelets 04/05/2013 327  150 - 400 K/uL Final  . Sodium 04/05/2013 139  137 - 147 mEq/L Final   Please  note change in reference range.  . Potassium 04/05/2013 4.0  3.7 - 5.3 mEq/L Final   Please note change in reference range.  . Chloride 04/05/2013 99  96 - 112 mEq/L Final  . CO2 04/05/2013 27  19 - 32 mEq/L Final  . Glucose, Bld 04/05/2013 299* 70 - 99 mg/dL Final  . BUN 04/05/2013 15  6 - 23 mg/dL Final  . Creatinine, Ser 04/05/2013 0.52  0.50 - 1.10 mg/dL Final  . Calcium 04/05/2013 9.2  8.4 - 10.5 mg/dL Final  . GFR calc non Af Amer 04/05/2013 >90  >90 mL/min Final  . GFR calc Af Amer 04/05/2013 >90  >90 mL/min Final   Comment: (NOTE)                          The eGFR has been calculated using the CKD EPI equation.                          This calculation has not been validated in all clinical situations.                          eGFR's persistently <90 mL/min signify possible Chronic Kidney                          Disease.  . Glucose-Capillary 04/04/2013 259* 70 - 99 mg/dL Final  . Comment 1 04/04/2013 Notify RN   Final  . Comment 2 04/04/2013 Documented in Chart   Final  . Glucose-Capillary 04/04/2013 242* 70 - 99 mg/dL Final  . Glucose-Capillary 04/05/2013 244* 70 - 99 mg/dL Final  . Comment 1 04/05/2013 Notify RN   Final  Hospital Outpatient Visit on 03/25/2013  Component Date Value Range Status  . aPTT 03/25/2013 32  24 - 37 seconds Final  . WBC 03/25/2013 11.4* 4.0 - 10.5 K/uL Final  . RBC 03/25/2013 5.12* 3.87 -  5.11 MIL/uL Final  . Hemoglobin 03/25/2013 13.6  12.0 - 15.0 g/dL Final  . HCT 03/25/2013 41.9  36.0 - 46.0 % Final  . MCV 03/25/2013 81.8  78.0 - 100.0 fL Final  . MCH 03/25/2013 26.6  26.0 - 34.0 pg Final  . MCHC 03/25/2013 32.5  30.0 - 36.0 g/dL Final  . RDW 03/25/2013 15.2  11.5 - 15.5 % Final  . Platelets 03/25/2013 300  150 - 400 K/uL Final  . Sodium 03/25/2013 138  135 - 145 mEq/L Final  . Potassium 03/25/2013 3.4* 3.5 - 5.1 mEq/L Final  . Chloride 03/25/2013 98  96 - 112 mEq/L Final  . CO2 03/25/2013 26  19 - 32 mEq/L Final  . Glucose, Bld 03/25/2013  239* 70 - 99 mg/dL Final  . BUN 03/25/2013 13  6 - 23 mg/dL Final  . Creatinine, Ser 03/25/2013 0.45* 0.50 - 1.10 mg/dL Final  . Calcium 03/25/2013 9.6  8.4 - 10.5 mg/dL Final  . Total Protein 03/25/2013 6.9  6.0 - 8.3 g/dL Final  . Albumin 03/25/2013 3.1* 3.5 - 5.2 g/dL Final  . AST 03/25/2013 27  0 - 37 U/L Final  . ALT 03/25/2013 18  0 - 35 U/L Final  . Alkaline Phosphatase 03/25/2013 119* 39 - 117 U/L Final  . Total Bilirubin 03/25/2013 0.5  0.3 - 1.2 mg/dL Final  . GFR calc non Af Amer 03/25/2013 >90  >90 mL/min Final  . GFR calc Af Amer 03/25/2013 >90  >90 mL/min Final   Comment: (NOTE)                          The eGFR has been calculated using the CKD EPI equation.                          This calculation has not been validated in all clinical situations.                          eGFR's persistently <90 mL/min signify possible Chronic Kidney                          Disease.  [name] Prothrombin Time 03/25/2013 14.6  11.6 - 15.2 seconds Final  . INR 03/25/2013 1.16  0.00 - 1.49 Final  . Color, Urine 03/25/2013 YELLOW  YELLOW Final  . APPearance 03/25/2013 CLOUDY* CLEAR Final  . Specific Gravity, Urine 03/25/2013 1.025  1.005 - 1.030 Final  . pH 03/25/2013 6.0  5.0 - 8.0 Final  . Glucose, UA 03/25/2013 NEGATIVE  NEGATIVE mg/dL Final  . Hgb urine dipstick 03/25/2013 NEGATIVE  NEGATIVE Final  . Bilirubin Urine 03/25/2013 NEGATIVE  NEGATIVE Final  . Ketones, ur 03/25/2013 NEGATIVE  NEGATIVE mg/dL Final  . Protein, ur 03/25/2013 100* NEGATIVE mg/dL Final  . Urobilinogen, UA 03/25/2013 0.2  0.0 - 1.0 mg/dL Final  . Nitrite 03/25/2013 NEGATIVE  NEGATIVE Final  . Leukocytes, UA 03/25/2013 SMALL* NEGATIVE Final  . MRSA, PCR 03/25/2013 NEGATIVE  NEGATIVE Final  . Staphylococcus aureus 03/25/2013 NEGATIVE  NEGATIVE Final   Comment:                                 The Xpert SA Assay (FDA  approved for NASAL specimens                          in patients over 66  years of age),                          is one component of                          a comprehensive surveillance                          program.  Test performance has                          been validated by [employer] for patients greater                          than or equal to 41 year old.                          It is not intended                          to diagnose infection nor to                          guide or monitor treatment.  . Squamous Epithelial / LPF 03/25/2013 FEW* RARE Final  . WBC, UA 03/25/2013 0-2  <3 WBC/hpf Final  . Bacteria, UA 03/25/2013 FEW* RARE Final     X-Rays:Dg Hip Complete Right  03/25/2013   CLINICAL DATA:  Preop for right hip replacement  EXAM: RIGHT HIP - COMPLETE 2+ VIEW  COMPARISON:  None.  FINDINGS: Three views of the right hip submitted. Left hip prosthesis is noted. Extensive degenerative changes are noted right hip joint. Significant narrowing of joint space. There is spurring and remodeling of the femoral head. Acetabular sclerosis is noted. Spurring of the acetabulum. No acute fracture or subluxation.  IMPRESSION: No acute fracture or subluxation. Extensive degenerative changes right hip joint.   Electronically Signed   By: Lahoma Crocker M.D.   On: 03/25/2013 10:03   Dg Pelvis Portable  04/03/2013   CLINICAL DATA:  Right hip arthroplasty.  EXAM: PORTABLE PELVIS 1-2 VIEWS  COMPARISON:  None.  FINDINGS: Total right hip arthroplasty is appreciated hardware appears intact. Visualized native osseous structures are unremarkable. Postsurgical changes are identified within the soft tissues of the right hip as well as a surgical drain. A total left hip arthroplasty is pre is appreciated. Visualized portions are unremarkable.  IMPRESSION: Patient is status post total right hip arthroplasty.   Electronically Signed   By: Margaree Mackintosh M.D.   On: 04/03/2013 14:16   Dg C-arm 1-60 Min-no Report  04/03/2013   CLINICAL DATA: right  anterior hip   C-ARM 1-60 MINUTES  Fluoroscopy was utilized by the requesting physician.  No radiographic  interpretation.     EKG: Orders placed in visit on 01/18/13  . EKG 12-LEAD     Hospital Course: Patient was admitted to [facility] and taken to the OR and underwent the above state procedure without complications.  Patient tolerated the procedure well and was later transferred to the recovery room and then to the orthopaedic floor for postoperative care.  They were given PO and IV analgesics for pain control following their surgery.  They were given 24 hours of postoperative antibiotics of  Anti-infectives   Start     Dose/Rate Route Frequency Ordered Stop   04/03/13 1830  ceFAZolin (ANCEF) IVPB 1 g/50 mL premix     1 g 100 mL/hr over 30 Minutes Intravenous Every 6 hours 04/03/13 1520 04/03/13 2352   04/03/13 0845  ceFAZolin (ANCEF) IVPB 2 g/50 mL premix     2 g 100 mL/hr over 30 Minutes Intravenous On call to O.R. 04/03/13 0840 04/03/13 1219     and started on DVT prophylaxis in the form of Lovenox.   PT and OT were ordered for total hip protocol.  The patient was allowed to be WBAT with therapy. Discharge planning was consulted to help with postop disposition and equipment needs.  Patient had a decent night on the evening of surgery and only mild pain.  They started to get up OOB with therapy on day one walking about 130 feet.  Hemovac drain was pulled without difficulty.  Continued to work with therapy into day two.  Dressing was changed on day two and the incision was healing well. Patient was seen in rounds and was ready to go home later that same day.  Discharge home with home health  Diet - Cardiac diet and Diabetic diet  Follow up - in 2 weeks  Activity - WBAT  Disposition - Home  Condition Upon Discharge - Good  D/C Meds - See DC Summary  DVT Prophylaxis - Start a full dose 325 mg Aspirin daily for four weeks.       Discharge Orders   Future Appointments  Provider Department Dept Phone   05/22/2013 9:15 AM Darlin Coco, MD [place] Office [phone]   11/12/2013 9:15 AM Hayden Pedro, MD [facility] [phone]   Future Orders Complete By Expires   Call MD / Call 911  As directed    Comments:     If you experience chest pain or shortness of breath, CALL 911 and be transported to the hospital emergency room.  If you develope a fever above 101 F, pus (white drainage) or increased drainage or redness at the wound, or calf pain, call your surgeon's office.   Change dressing  As directed    Comments:     You may change your dressing dressing daily with sterile 4 x 4 inch gauze dressing and paper tape.  Do not submerge the incision under water.   Constipation Prevention  As directed    Comments:     Drink plenty of fluids.  Prune juice may be helpful.  You may use a stool softener, such as Colace (over the counter) 100 mg twice a day.  Use MiraLax (over the counter) for constipation as needed.   Diet - low sodium heart healthy  As directed    Discharge instructions  As directed    Comments:     Pick up stool softner and laxative for home. Do not submerge incision under water. May shower. Continue to use ice for pain and swelling from surgery.  Total Hip Protocol.  Start an enteric-coated Aspirin 325 mg twice a day for four weeks.  Do not sit on low chairs, stoools or toilet seats, as it may be difficult to get up from low surfaces  As directed    Driving restrictions  As directed    Comments:     No driving until released by the physician.   Increase activity slowly as tolerated  As directed    Lifting restrictions  As directed    Comments:     No lifting until released by the physician.   Patient may shower  As directed    Comments:     You may shower without a dressing once there is no drainage.  Do not wash over the wound.  If drainage remains, do not shower until drainage stops.   TED  hose  As directed    Comments:     Use stockings (TED hose) for 3 weeks on both leg(s).  You may remove them at night for sleeping.   Weight bearing as tolerated  As directed    Questions:     Laterality:     Extremity:         Medication List    STOP taking these medications       cyanocobalamin 2000 MCG tablet     estrogen (conjugated)-medroxyprogesterone 0.3-1.5 MG per tablet  Commonly known as:  PREMPRO     leflunomide 20 MG tablet  Commonly known as:  ARAVA     Omega 3 1200 MG Caps     ORENCIA [place]     Vitamin D 2000 UNITS tablet      TAKE these medications       aspirin EC 325 MG tablet  Take 1 tablet (325 mg total) by mouth daily. Take daily for four weeks.     atorvastatin 10 MG tablet  Commonly known as:  LIPITOR  Take 10 mg by mouth every other day.     buPROPion 150 MG 12 hr tablet  Commonly known as:  WELLBUTRIN SR  Take 150 mg by mouth 2 (two) times daily.     dabigatran 150 MG Caps capsule  Commonly known as:  PRADAXA  Take 150 mg by mouth every 12 (twelve) hours.      digoxin 0.125 MG tablet  Commonly known as:  LANOXIN  Take 0.125 mg by mouth every morning.     diltiazem 360 MG 24 hr capsule  Commonly known as:  CARDIZEM CD  Take 360 mg by mouth every morning.     esomeprazole 40 MG capsule  Commonly known as:  NEXIUM  Take 40 mg by mouth daily.     furosemide 40 MG tablet  Commonly known as:  LASIX  Take 40 mg by mouth every morning.     metFORMIN 500 MG tablet  Commonly known as:  GLUCOPHAGE  Take 1,000 mg by mouth 2 (two) times daily with a meal.     methocarbamol 500 MG tablet  Commonly known as:  ROBAXIN  Take 1 tablet (500 mg total) by mouth every 6 (six) hours as needed for muscle spasms.     metoprolol succinate 100 MG 24 hr tablet  Commonly known as:  TOPROL XL  Take 1 tablet (100 mg total) by mouth 2 (two) times daily.     nitroGLYCERIN 0.4 MG SL tablet  Commonly known as:  NITROSTAT  Place 1 tablet (0.4 mg total)  under the tongue every 5 (five) minutes as needed.     oxyCODONE 5 MG immediate release tablet  Commonly known as:  Oxy IR/ROXICODONE  Take 1-2 tablets (5-10 mg total) by mouth every 3 (three) hours as needed for breakthrough pain.     potassium chloride 10 MEQ tablet  Commonly known as:  KLOR-CON 10  Take 2 tablets (20 mEq total) by mouth 2 (two) times daily.     predniSONE 5 MG tablet  Commonly known as:  DELTASONE  - Take 1 tablet (5 mg total) by mouth daily. Take two tablets twice a day on 04/05/2013.  - Then take one tablet twice a day on 04/06/2013.  - Then resume one tablet daily on 04/07/2013.     traMADol 50 MG tablet  Commonly known as:  ULTRAM  Take 1-2 tablets (50-100 mg total) by mouth every 6 (six) hours as needed (mild pain).       Follow-up Information   Follow up with [facility]. [facility])    Contact information:   [address] [place] [address] [phone]       Follow up with Gearlean Alf, MD. Schedule an appointment as soon as possible for a visit on 04/18/2013.   Specialty:  Orthopedic Surgery   Contact information:   [address] [place] [address] [phone]       Signed: Mickel Crow 04/05/2013, 8:57 AM

## 2013-04-05 NOTE — Discharge Instructions (Signed)
°Dr. Frank Aluisio °Total Joint Specialist °[facility] °[address] °[place], [place] [address] °[phone] ° ° ° °ANTERIOR APPROACH TOTAL HIP REPLACEMENT POSTOPERATIVE DIRECTIONS ° ° °Hip Rehabilitation, Guidelines Following Surgery  °The results of a hip operation are greatly improved after range of motion and muscle strengthening exercises. Follow all safety measures which are given to protect your hip. If any of these exercises cause increased pain or swelling in your joint, decrease the amount until you are comfortable again. Then slowly increase the exercises. Call your caregiver if you have problems or questions.  °HOME CARE INSTRUCTIONS  °Most of the following instructions are designed to prevent the dislocation of your new hip.  °Remove items at home which could result in a fall. This includes throw rugs or furniture in walking pathways.  °Continue medications as instructed at time of discharge. °· You may have some home medications which will be placed on hold until you complete the course of blood thinner medication. °· You may start showering once you are discharged home but do not submerge the incision under water. Just pat the incision dry and apply a dry gauze dressing on daily. °Do not put on socks or shoes without following the instructions of your caregivers.  °Sit on high chairs which makes it easier to stand.  °Sit on chairs with arms. Use the chair arms to help push yourself up when arising.  °Keep your leg on the side of the operation out in front of you when standing up.  °Arrange for the use of a toilet seat elevator so you are not sitting low.   °· Walk with walker as instructed.  °You may resume a sexual relationship in one month or when given the OK by your caregiver.  °Use walker as long as suggested by your caregivers.  °You may put full weight on your legs and walk as much as is comfortable. °Avoid periods of inactivity such as sitting longer than an hour  when not asleep. This helps prevent blood clots.  °You may return to work once you are cleared by your surgeon.  °Do not drive a car for 6 weeks or until released by your surgeon.  °Do not drive while taking narcotics.  °Wear elastic stockings for three weeks following surgery during the day but you may remove then at night.  °Make sure you keep all of your appointments after your operation with all of your doctors and caregivers. You should call the office at the above phone number and make an appointment for approximately two weeks after the date of your surgery. °Change the dressing daily and reapply a dry dressing each time. °Please pick up a stool softener and laxative for home use as long as you are requiring pain medications. °· Continue to use ice on the hip for pain and swelling from surgery. You may notice swelling that will progress down to the foot and ankle.  This is normal after  surgery.  Elevate the leg when you are not up walking on it.   °It is important for you to complete the blood thinner medication as prescribed by your doctor. °· Continue to use the breathing machine which will help keep your temperature down.  It is common for your temperature to cycle up and down following surgery, especially at night when you are not up moving around and exerting yourself.  The breathing machine keeps your lungs expanded and your temperature down. ° °RANGE OF MOTION AND STRENGTHENING EXERCISES  °  These exercises are designed to help you keep full movement of your hip joint. Follow your caregiver's or physical therapist's instructions. Perform all exercises about fifteen times, three times per day or as directed. Exercise both hips, even if you have had only one joint replacement. These exercises can be done on a training (exercise) mat, on the floor, on a table or on a bed. Use whatever works the best and is most comfortable for you. Use music or television while you are exercising so that the exercises are  a pleasant break in your day. This will make your life better with the exercises acting as a break in routine you can look forward to.  Lying on your back, slowly slide your foot toward your buttocks, raising your knee up off the floor. Then slowly slide your foot back down until your leg is straight again.  Lying on your back spread your legs as far apart as you can without causing discomfort.  Lying on your side, raise your upper leg and foot straight up from the floor as far as is comfortable. Slowly lower the leg and repeat.  Lying on your back, tighten up the muscle in the front of your thigh (quadriceps muscles). You can do this by keeping your leg straight and trying to raise your heel off the floor. This helps strengthen the largest muscle supporting your knee.  Lying on your back, tighten up the muscles of your buttocks both with the legs straight and with the knee bent at a comfortable angle while keeping your heel on the floor.   SKILLED REHAB INSTRUCTIONS: If the patient is transferred to a skilled rehab facility following release from the hospital, a list of the current medications will be sent to the facility for the patient to continue.  When discharged from the skilled rehab facility, please have the facility set up the patient's [place] prior to being released. Also, the skilled facility will be responsible for providing the patient with their medications at time of release from the facility to include their pain medication, the muscle relaxants, and their blood thinner medication. If the patient is still at the rehab facility at time of the two week follow up appointment, the skilled rehab facility will also need to assist the patient in arranging follow up appointment in our office and any transportation needs.  MAKE SURE YOU:  Understand these instructions.  Will watch your condition.  Will get help right away if you are not doing well or get worse.  Pick up  stool softner and laxative for home. Do not submerge incision under water. May shower. Continue to use ice for pain and swelling from surgery. Total Hip Protocol.  Take a 325 mg Aspirin daily for four weeks.

## 2013-05-13 ENCOUNTER — Other Ambulatory Visit: Payer: Self-pay | Admitting: Nurse Practitioner

## 2013-05-22 ENCOUNTER — Encounter: Payer: Self-pay | Admitting: Cardiology

## 2013-05-22 ENCOUNTER — Ambulatory Visit: Payer: Medicare Other | Admitting: Cardiology

## 2013-05-22 ENCOUNTER — Ambulatory Visit (INDEPENDENT_AMBULATORY_CARE_PROVIDER_SITE_OTHER): Payer: Medicare Other | Admitting: Cardiology

## 2013-05-22 VITALS — BP 138/86 | HR 66 | Ht 67.0 in | Wt 171.0 lb

## 2013-05-22 DIAGNOSIS — IMO0001 Reserved for inherently not codable concepts without codable children: Secondary | ICD-10-CM

## 2013-05-22 DIAGNOSIS — M069 Rheumatoid arthritis, unspecified: Secondary | ICD-10-CM

## 2013-05-22 DIAGNOSIS — E1165 Type 2 diabetes mellitus with hyperglycemia: Secondary | ICD-10-CM

## 2013-05-22 DIAGNOSIS — I4891 Unspecified atrial fibrillation: Secondary | ICD-10-CM

## 2013-05-22 DIAGNOSIS — I119 Hypertensive heart disease without heart failure: Secondary | ICD-10-CM

## 2013-05-22 NOTE — Assessment & Plan Note (Signed)
The patient has known diabetes mellitus.  She is more careful with her diet.  Her weight is down 1 pound since last visit.  She has not had any hypoglycemic episodes.  Her blood work is followed by her PCP.

## 2013-05-22 NOTE — Progress Notes (Signed)
[name] Date of Birth:  11-02-1945 [address] [place], [place]  [address] [phone]         Fax   [phone]  History of Present Illness: This 68 year old woman has HTN, DM, obesity, HLD and atrial fib. She is on chronic anticoagulation. Last echo was in January of 2013 and showed a normal EF. Digoxin was added back in the summer due to increased heart rate. We switched her diuretic to Lasix. She has had a good response to Lasix.   Not short of breath. Her swelling has resolved. She underwent successful right hip surgery December 31.  Since last visit she has not been experiencing any new cardiac symptoms   Current Outpatient Prescriptions  Medication Sig Dispense Refill  . atorvastatin (LIPITOR) 10 MG tablet Take 10 mg by mouth every other day.       [name] buPROPion (WELLBUTRIN SR) 150 MG 12 hr tablet Take 150 mg by mouth 2 (two) times daily.        . Cholecalciferol (VITAMIN D3) 2000 UNITS TABS Take 1 tablet by mouth daily.      . Cyanocobalamin (VITAMIN B 12 PO) Take 2,000 mg by mouth daily.      . dabigatran (PRADAXA) 150 MG CAPS capsule Take 150 mg by mouth daily. States Last dose will be 03/31/13 AM      . digoxin (LANOXIN) 0.125 MG tablet Take 0.125 mg by mouth every morning.      . diltiazem (CARDIZEM CD) 360 MG 24 hr capsule Take 360 mg by mouth every morning.      [name] esomeprazole (NEXIUM) 40 MG capsule Take 40 mg by mouth daily.        . furosemide (LASIX) 40 MG tablet Take 40 mg by mouth every morning.      . leflunomide (ARAVA) 20 MG tablet Take 20 mg by mouth daily.       . metFORMIN (GLUCOPHAGE) 500 MG tablet Take 1,000 mg by mouth 2 (two) times daily with a meal.      . metoprolol succinate (TOPROL XL) 100 MG 24 hr tablet Take 1 tablet (100 mg total) by mouth 2 (two) times daily.  180 tablet  3  . Multiple Vitamins-Minerals (PRESERVISION AREDS 2 PO) Take 1 tablet by mouth 2 (two) times daily.      [name] NITROSTAT 0.4 MG SL tablet PLACE 1 TABLET UNDER  TONGUE EVERY 5 MINUTES AS NEEDED  25 tablet  0  . Omega 3 1200 MG CAPS Take 1 capsule by mouth 2 (two) times daily.      [name] ORENCIA 125 MG/ML SOSY Inject 125 mg into the skin once a week.       . potassium chloride (KLOR-CON 10) 10 MEQ tablet Take 2 tablets (20 mEq total) by mouth 2 (two) times daily.  240 tablet  3  . predniSONE (DELTASONE) 5 MG tablet Take 1 tablet (5 mg total) by mouth daily. Take two tablets twice a day on 04/05/2013. Then take one tablet twice a day on 04/06/2013. Then resume one tablet daily on 04/07/2013.  40 tablet  0  . PREMPRO 0.3-1.5 MG per tablet Take 1 tablet by mouth every other day.        No current facility-administered medications for this visit.    No Known Allergies  Patient Active Problem List   Diagnosis Date Noted  . Paroxysmal atrial fibrillation 07/01/2010    Priority: Medium  . Hypertension 07/01/2010    Priority:  Medium  . Chest pain, atypical 07/01/2010    Priority: Medium  . OA (osteoarthritis) of hip 04/03/2013  . Osteoarthritis of hip 01/18/2013  . Night sweats 09/06/2012  . Chest pain   . Atrial fibrillation with RVR   . Anemia   . Anxiety and depression   . Gastroesophageal reflux disease   . Hepatitis   . Jaundice   . Hypercholesterolemia 07/01/2010  . Diabetes mellitus 07/01/2010  . Rheumatoid arthritis 07/01/2010  . Sinus tachycardia 07/01/2010    History  Smoking status  . Never Smoker   Smokeless tobacco  . Never Used    History  Alcohol Use No    No family history on file.  Review of Systems: Constitutional: no fever chills diaphoresis or fatigue or change in weight.  Head and neck: no hearing loss, no epistaxis, no photophobia or visual disturbance. Respiratory: No cough, shortness of breath or wheezing. Cardiovascular: No chest pain peripheral edema, palpitations. Gastrointestinal: No abdominal distention, no abdominal pain, no change in bowel habits hematochezia or melena. Genitourinary: No dysuria, no  frequency, no urgency, no nocturia. Musculoskeletal:No arthralgias, no back pain, no gait disturbance or myalgias. Neurological: No dizziness, no headaches, no numbness, no seizures, no syncope, no weakness, no tremors. Hematologic: No lymphadenopathy, no easy bruising. Psychiatric: No confusion, no hallucinations, no sleep disturbance.    Physical Exam: Filed Vitals:   05/22/13 1012  BP: 138/86  Pulse: 66   the general appearance reveals a well-developed well-nourished woman in no distress.The head and neck exam reveals pupils equal and reactive.  Extraocular movements are full.  There is no scleral icterus.  The mouth and pharynx are normal.  The neck is supple.  The carotids reveal no bruits.  The jugular venous pressure is normal.  The  thyroid is not enlarged.  There is no lymphadenopathy.  The chest is clear to percussion and auscultation.  There are no rales or rhonchi.  Expansion of the chest is symmetrical.  The precordium is quiet.  The first heart sound is normal.  The second heart sound is physiologically split.  There is no murmur gallop rub or click.  There is no abnormal lift or heave.  The abdomen is soft and nontender.  The bowel sounds are normal.  The liver and spleen are not enlarged.  There are no abdominal masses.  There are no abdominal bruits.  Extremities reveal good pedal pulses.  There is no phlebitis or edema.  There is no cyanosis or clubbing.  Strength is normal and symmetrical in all extremities.  There is no lateralizing weakness.  There are no sensory deficits.  The skin is warm and dry.  There is no rash.     Assessment / Plan: Overall the patient continues to do well.  No change in meds.  Recheck in 4 months.

## 2013-05-22 NOTE — Assessment & Plan Note (Signed)
The patient is in permanent atrial fibrillation.  She tends to have a rapid ventricular response and requires large amounts of AV node blocking agents to keep her heart rate in a satisfactory range.  She is on digoxin, diltiazem, and metoprolol.  She notes a fast heart rate briefly if she makes up her bed but otherwise no awareness of fast heart rate.  She is on Pradaxa and has had no thromboembolic events.

## 2013-05-22 NOTE — Patient Instructions (Signed)
Your physician recommends that you continue on your current medications as directed. Please refer to the Current Medication list given to you today.  Your physician recommends that you schedule a follow-up appointment in: 4 month ov  

## 2013-05-22 NOTE — Assessment & Plan Note (Signed)
Patient has had a history of rheumatoid arthritis for 35 years.  She is now followed by Dr. Ouida Sills.  Her disease appears to be under reasonably good control.  As noted she has also osteoarthritis and had successful right hip replacement on 04/03/13 by Dr. Wynelle Link.

## 2013-05-25 ENCOUNTER — Other Ambulatory Visit: Payer: Self-pay | Admitting: Cardiology

## 2013-06-19 ENCOUNTER — Encounter (INDEPENDENT_AMBULATORY_CARE_PROVIDER_SITE_OTHER): Payer: Self-pay

## 2013-07-08 ENCOUNTER — Encounter (INDEPENDENT_AMBULATORY_CARE_PROVIDER_SITE_OTHER): Payer: Self-pay | Admitting: General Surgery

## 2013-07-08 ENCOUNTER — Ambulatory Visit (INDEPENDENT_AMBULATORY_CARE_PROVIDER_SITE_OTHER): Payer: Medicare Other | Admitting: General Surgery

## 2013-07-08 VITALS — BP 134/78 | HR 72 | Temp 97.8°F | Resp 14 | Ht 67.0 in | Wt 168.8 lb

## 2013-07-08 DIAGNOSIS — R1903 Right lower quadrant abdominal swelling, mass and lump: Secondary | ICD-10-CM

## 2013-07-08 NOTE — Progress Notes (Signed)
Patient ID: [name], female   DOB: 1946/03/14, 68 y.o.   MRN: [id number]  Chief Complaint  Patient presents with  . Umbilical Hernia    HPI [name] is a 68 y.o. female.   HPI  She is referred by Bing Matter For evaluation of a possible umbilical hernia. About 3 weeks ago, she noticed a painful nodule to the right of the umbilicus. There is some concern this may be an umbilical hernia and she is sent over here for evaluation. No difficulty with urination. No chronic constipation.  Past Medical History  Diagnosis Date  . Chest pain   . Atrial fibrillation   . Anemia     hx of  . Hypertension   . Diabetes mellitus   . Rheumatoid arthritis(714.0)   . Anxiety and depression   . Gastroesophageal reflux disease   . Hyperlipidemia   . Hepatitis 1970  . Jaundice     age 74  . Actinic keratosis   . Atrial fibrillation   . Carpal tunnel syndrome   . Edema   . Hiatal hernia   . Memory loss   . Thyroid disease     multi nodular goiter    Past Surgical History  Procedure Laterality Date  . Replacement total knee  2009    denies  . Hand surgery  1995 and 1996    artificial joints both hands  . Cataract extraction      left  . Joint replacement Left 2009    HIP  . Total hip arthroplasty Right 04/03/2013    Procedure: RIGHT TOTAL HIP ARTHROPLASTY ANTERIOR APPROACH;  Surgeon: Gearlean Alf, MD;  Location: [facility];  Service: Orthopedics;  Laterality: Right;    History reviewed. No pertinent family history.  Social History History  Substance Use Topics  . Smoking status: Never Smoker   . Smokeless tobacco: Never Used  . Alcohol Use: No    No Known Allergies  Current Outpatient Prescriptions  Medication Sig Dispense Refill  . atorvastatin (LIPITOR) 10 MG tablet Take 10 mg by mouth every other day.       [name] buPROPion (WELLBUTRIN SR) 150 MG 12 hr tablet Take 150 mg by mouth 2 (two) times daily.        . Cholecalciferol (VITAMIN D3) 2000 UNITS TABS Take 1  tablet by mouth daily.      . Cyanocobalamin (VITAMIN B 12 PO) Take 2,000 mg by mouth daily.      . dabigatran (PRADAXA) 150 MG CAPS capsule Take 150 mg by mouth daily. States Last dose will be 03/31/13 AM      . digoxin (LANOXIN) 0.125 MG tablet Take 0.125 mg by mouth every morning.      . diltiazem (CARDIZEM CD) 360 MG 24 hr capsule Take 360 mg by mouth every morning.      [name] esomeprazole (NEXIUM) 40 MG capsule Take 40 mg by mouth daily.        . furosemide (LASIX) 40 MG tablet Take 40 mg by mouth every morning.      [name] KLOR-CON 10 10 MEQ tablet TAKE 2 TABLETS BY MOUTH TWICE DAILY  240 tablet  3  . leflunomide (ARAVA) 20 MG tablet Take 20 mg by mouth daily.       . metFORMIN (GLUCOPHAGE) 500 MG tablet Take 1,000 mg by mouth 2 (two) times daily with a meal.      . metoprolol succinate (TOPROL XL) 100 MG 24 hr tablet Take 1  tablet (100 mg total) by mouth 2 (two) times daily.  180 tablet  3  . Multiple Vitamins-Minerals (PRESERVISION AREDS 2 PO) Take 1 tablet by mouth 2 (two) times daily.      [name] NITROSTAT 0.4 MG SL tablet PLACE 1 TABLET UNDER TONGUE EVERY 5 MINUTES AS NEEDED  25 tablet  0  . Omega 3 1200 MG CAPS Take 1 capsule by mouth 2 (two) times daily.      [name] ORENCIA 125 MG/ML SOSY Inject 125 mg into the skin once a week.       . predniSONE (DELTASONE) 5 MG tablet Take 1 tablet (5 mg total) by mouth daily. Take two tablets twice a day on 04/05/2013. Then take one tablet twice a day on 04/06/2013. Then resume one tablet daily on 04/07/2013.  40 tablet  0  . PREMPRO 0.3-1.5 MG per tablet Take 1 tablet by mouth every other day.        No current facility-administered medications for this visit.    Review of Systems Review of Systems  Constitutional: Negative.   Respiratory: Negative.   Cardiovascular: Negative.   Gastrointestinal: Positive for abdominal pain.    Blood pressure 134/78, pulse 72, temperature 97.8 F (36.6 C), temperature source Oral, resp. rate 14, height 5\' 7"  (1.702 m),  weight 168 lb 12.8 oz (76.567 kg).  Physical Exam Physical Exam  Constitutional: No distress.  Overweight female.  Cardiovascular:  Irregular rate. Irregular rhythm.  Abdominal: Soft.  There is a tender, 1 cm fixed abdominal wall nodule 9 cm to the umbilicus at the 7:98 position in the right lower quadrant. No umbilical bulges.  Genitourinary:  No inguinal bulges.    Data Reviewed Note from [facility].  Assessment    Painful, tender fixed abdominal wall nodule. No evidence of umbilical hernia.     Plan    Removal of abdominal wall nodule under local anesthesia. The procedure and risks were explained to her. Risks include on limited to bleeding, infection, wound healing problems, reaction local anesthesia, failure to resolve the pain. Prior to scheduling the surgery, I will send a note to Dr. Mare Ferrari to see if it is okay to stop her Pradaxa  for 5 days.       Mayo Owczarzak J 07/08/2013, 3:47 PM

## 2013-07-08 NOTE — Patient Instructions (Signed)
I will send a message to Dr. Mare Ferrari about stopping your Pradaxa. If he feels that that is okay, we will call you and schedule the surgery.

## 2013-07-15 ENCOUNTER — Telehealth: Payer: Self-pay | Admitting: Cardiology

## 2013-07-15 ENCOUNTER — Telehealth (INDEPENDENT_AMBULATORY_CARE_PROVIDER_SITE_OTHER): Payer: Self-pay | Admitting: General Surgery

## 2013-07-15 ENCOUNTER — Telehealth (INDEPENDENT_AMBULATORY_CARE_PROVIDER_SITE_OTHER): Payer: Self-pay

## 2013-07-15 NOTE — Telephone Encounter (Signed)
Ok to resume Pradaxa day after surgery if no unusual bleeding per  Dr. Mare Ferrari. [name] at Dr Bertrum Sol office of Pradaxa instructions

## 2013-07-15 NOTE — Telephone Encounter (Signed)
New message     Can pt stop prodaxa prior to hernia repair

## 2013-07-15 NOTE — Telephone Encounter (Signed)
Will forward to  Dr. Brackbill for review 

## 2013-07-15 NOTE — Telephone Encounter (Signed)
New message     Got verbal clearance for pt to stop prodaxa.  They require the clearance in writing--please fax to [phone] at your convenience.

## 2013-07-15 NOTE — Telephone Encounter (Signed)
With her normal renal function she should stop her Pradaxa for 2 full days prior to the surgical day.

## 2013-07-15 NOTE — Telephone Encounter (Signed)
Pt of Dr. Zella Richer called to check on status of hearing from Dr. Sherryl Barters office regarding her Pradaxa, so she can get her surgery scheduled.  The pain is worsening.  Spoke with Dr .Bertrum Sol assistant, who will call their office today to check on it for her.  Pt states she is using an ice pack on it for now.

## 2013-07-15 NOTE — Telephone Encounter (Signed)
Waiting to hear from Dr. Sherryl Barters office regarding pre-op instructions for pt taking Pradaxa.

## 2013-07-16 NOTE — Telephone Encounter (Signed)
Will fax as requested

## 2013-07-19 ENCOUNTER — Other Ambulatory Visit (INDEPENDENT_AMBULATORY_CARE_PROVIDER_SITE_OTHER): Payer: Self-pay | Admitting: General Surgery

## 2013-07-19 ENCOUNTER — Telehealth (INDEPENDENT_AMBULATORY_CARE_PROVIDER_SITE_OTHER): Payer: Self-pay

## 2013-07-19 NOTE — Telephone Encounter (Signed)
LMOV pt will be hearing from surgery scheduling sometime next week.  Cardiac clearance rec'd.

## 2013-07-19 NOTE — Telephone Encounter (Signed)
Gave message below to patient.  She verbalized understanding.

## 2013-07-23 ENCOUNTER — Telehealth (INDEPENDENT_AMBULATORY_CARE_PROVIDER_SITE_OTHER): Payer: Self-pay

## 2013-07-23 NOTE — Telephone Encounter (Signed)
Pt is scheduled for surgery to remove abd mass on 07/29/13 by Dr. Zella Richer.  [facility] called because the pt receives an injection (Orencia) for RA.  They would like this tx postponed until after surgery.  I contacted the patient and she informed me that this tx is self injected.  She will postpone until after her surgery.  [facility] made aware.

## 2013-07-23 NOTE — Telephone Encounter (Signed)
Noted and agree. 

## 2013-07-29 ENCOUNTER — Other Ambulatory Visit (INDEPENDENT_AMBULATORY_CARE_PROVIDER_SITE_OTHER): Payer: Self-pay | Admitting: General Surgery

## 2013-07-29 DIAGNOSIS — D1739 Benign lipomatous neoplasm of skin and subcutaneous tissue of other sites: Secondary | ICD-10-CM

## 2013-08-01 ENCOUNTER — Telehealth (INDEPENDENT_AMBULATORY_CARE_PROVIDER_SITE_OTHER): Payer: Self-pay

## 2013-08-01 NOTE — Telephone Encounter (Signed)
Pt made aware pathology consistent with benign lipoma.

## 2013-08-14 ENCOUNTER — Encounter (INDEPENDENT_AMBULATORY_CARE_PROVIDER_SITE_OTHER): Payer: Self-pay | Admitting: General Surgery

## 2013-08-14 ENCOUNTER — Ambulatory Visit (INDEPENDENT_AMBULATORY_CARE_PROVIDER_SITE_OTHER): Payer: Medicare Other | Admitting: General Surgery

## 2013-08-14 VITALS — BP 142/82 | HR 80 | Temp 97.4°F | Resp 12 | Wt 168.0 lb

## 2013-08-14 DIAGNOSIS — Z4889 Encounter for other specified surgical aftercare: Secondary | ICD-10-CM

## 2013-08-14 NOTE — Patient Instructions (Signed)
Your clothes are irritating the incision. Shower daily and apply a dry bandage to the incision for the next 2 weeks.

## 2013-08-14 NOTE — Progress Notes (Signed)
She is here for her first postoperative visit after excision of a right lower quadrant painful abdominal wall mass on 07/29/2013. Pathology is consistent with lipoma. She states preoperative pain is gone.  On exam, incision is clean and intact with a little bit of irritation and clothing debris on it.  Assessment: Wound is healing okay but irritated from her clothing rubbing on it. Pathology benign.  Plan: Clean wound daily and apply a dry bandage for the next 2 weeks. Followup when necessary.

## 2013-08-29 ENCOUNTER — Telehealth: Payer: Self-pay | Admitting: Cardiology

## 2013-08-29 NOTE — Telephone Encounter (Signed)
Patient called wanting to know if of to take Etodolac. Will forward to  Dr. Mare Ferrari for review

## 2013-08-29 NOTE — Telephone Encounter (Signed)
New problem    Pt has a question about a medication she recived form her Dr Ouida Sills, Audelia [place].   ETODOLAC 200mg  cap LODINE.   Please give her a call back.

## 2013-08-29 NOTE — Telephone Encounter (Signed)
Since she is on blood thinners if she takes the etodolac it increases the risk of serious GI bleeding.  It would be better if she could get by with something like Tylenol

## 2013-08-29 NOTE — Telephone Encounter (Signed)
Left message to call back  

## 2013-08-29 NOTE — Telephone Encounter (Signed)
Advised patient

## 2013-09-12 ENCOUNTER — Other Ambulatory Visit: Payer: Self-pay | Admitting: Cardiology

## 2013-09-19 ENCOUNTER — Encounter (INDEPENDENT_AMBULATORY_CARE_PROVIDER_SITE_OTHER): Payer: Self-pay

## 2013-09-19 ENCOUNTER — Encounter: Payer: Self-pay | Admitting: Cardiology

## 2013-09-19 ENCOUNTER — Ambulatory Visit (INDEPENDENT_AMBULATORY_CARE_PROVIDER_SITE_OTHER): Payer: Medicare Other | Admitting: Cardiology

## 2013-09-19 VITALS — BP 118/88 | HR 64 | Ht 67.0 in | Wt 165.0 lb

## 2013-09-19 DIAGNOSIS — R0789 Other chest pain: Secondary | ICD-10-CM

## 2013-09-19 DIAGNOSIS — I119 Hypertensive heart disease without heart failure: Secondary | ICD-10-CM

## 2013-09-19 DIAGNOSIS — I10 Essential (primary) hypertension: Secondary | ICD-10-CM

## 2013-09-19 DIAGNOSIS — I4891 Unspecified atrial fibrillation: Secondary | ICD-10-CM

## 2013-09-19 DIAGNOSIS — E1165 Type 2 diabetes mellitus with hyperglycemia: Secondary | ICD-10-CM

## 2013-09-19 DIAGNOSIS — M069 Rheumatoid arthritis, unspecified: Secondary | ICD-10-CM

## 2013-09-19 DIAGNOSIS — IMO0001 Reserved for inherently not codable concepts without codable children: Secondary | ICD-10-CM

## 2013-09-19 MED ORDER — DIGOXIN 125 MCG PO TABS: 0.1250 mg | ORAL_TABLET | Freq: Every morning | ORAL | Status: DC

## 2013-09-19 NOTE — Progress Notes (Signed)
[name] Date of Birth:  04-08-1945 [name] [address] [place], [place]  [address] [phone]        Fax   [phone]   History of Present Illness: This 68 year old woman has HTN, DM, obesity, HLD and atrial fib. She is on chronic anticoagulation.  She is on Pradaxa. Last echo was in January of 2013 and showed a normal EF. Digoxin was added back in the summer due to increased heart rate. We switched her diuretic to Lasix. She has had a good response to Lasix.  Not short of breath. Her swelling has resolved. She underwent successful right hip surgery December 31. Since last visit she has not been experiencing any new cardiac symptoms.  She has significant rheumatoid arthritis and is on 3 medications for arthritis. She is diabetic.  She is not having any hypoglycemic episodes.   Current Outpatient Prescriptions  Medication Sig Dispense Refill  . atorvastatin (LIPITOR) 10 MG tablet Take 10 mg by mouth every other day.       [name] buPROPion (WELLBUTRIN SR) 150 MG 12 hr tablet Take 150 mg by mouth 2 (two) times daily.        . Cholecalciferol (VITAMIN D3) 2000 UNITS TABS Take 1 tablet by mouth daily.      . Cyanocobalamin (VITAMIN B 12 PO) Take 2,000 mg by mouth daily.      . dabigatran (PRADAXA) 150 MG CAPS capsule Take 150 mg by mouth 2 (two) times daily. States Last dose will be 03/31/13 AM      . digoxin (LANOXIN) 0.125 MG tablet Take 1 tablet (0.125 mg total) by mouth every morning.  90 tablet  3  . diltiazem (CARDIZEM CD) 360 MG 24 hr capsule Take 360 mg by mouth every morning.      [name] esomeprazole (NEXIUM) 40 MG capsule Take 40 mg by mouth daily.        . furosemide (LASIX) 40 MG tablet Take 40 mg by mouth every morning.      [name] KLOR-CON 10 10 MEQ tablet TAKE 2 TABLETS BY MOUTH TWICE DAILY  240 tablet  3  . leflunomide (ARAVA) 20 MG tablet Take 20 mg by mouth daily.       . metFORMIN (GLUCOPHAGE) 500 MG tablet Take 1,000 mg by mouth 2 (two) times daily  with a meal.      . metoprolol succinate (TOPROL XL) 100 MG 24 hr tablet Take 1 tablet (100 mg total) by mouth 2 (two) times daily.  180 tablet  3  . Multiple Vitamins-Minerals (PRESERVISION AREDS 2 PO) Take 1 tablet by mouth 2 (two) times daily.      [name] NITROSTAT 0.4 MG SL tablet PLACE 1 TABLET UNDER TONGUE EVERY 5 MINUTES AS NEEDED  25 tablet  0  . Omega 3 1200 MG CAPS Take 1 capsule by mouth 2 (two) times daily.      [name] ORENCIA 125 MG/ML SOSY Inject 125 mg into the skin once a week.       . predniSONE (DELTASONE) 5 MG tablet Take 5 mg by mouth daily.      [name] PREMPRO 0.3-1.5 MG per tablet Take 1 tablet by mouth every other day.        No current facility-administered medications for this visit.    No Known Allergies  Patient Active Problem List   Diagnosis Date Noted  . Paroxysmal atrial fibrillation 07/01/2010    Priority: Medium  . Hypertension 07/01/2010  Priority: Medium  . Chest pain, atypical 07/01/2010    Priority: Medium  . Abdominal wall mass of right lower quadrant 07/08/2013  . OA (osteoarthritis) of hip 04/03/2013  . Osteoarthritis of hip 01/18/2013  . Night sweats 09/06/2012  . Chest pain   . Atrial fibrillation with RVR   . Anemia   . Anxiety and depression   . Gastroesophageal reflux disease   . Hepatitis   . Jaundice   . Hypercholesterolemia 07/01/2010  . Type II or unspecified type diabetes mellitus without mention of complication, uncontrolled 07/01/2010  . Rheumatoid arthritis 07/01/2010  . Sinus tachycardia 07/01/2010    History  Smoking status  . Never Smoker   Smokeless tobacco  . Never Used    History  Alcohol Use No    History reviewed. No pertinent family history.  Review of Systems: Constitutional: no fever chills diaphoresis or fatigue or change in weight.  Head and neck: no hearing loss, no epistaxis, no photophobia or visual disturbance. Respiratory: No cough, shortness of breath or wheezing. Cardiovascular: No chest pain  peripheral edema, palpitations. Gastrointestinal: No abdominal distention, no abdominal pain, no change in bowel habits hematochezia or melena. Genitourinary: No dysuria, no frequency, no urgency, no nocturia. Musculoskeletal:No arthralgias, no back pain, no gait disturbance or myalgias. Neurological: No dizziness, no headaches, no numbness, no seizures, no syncope, no weakness, no tremors. Hematologic: No lymphadenopathy, no easy bruising. Psychiatric: No confusion, no hallucinations, no sleep disturbance.    Physical Exam: Filed Vitals:   09/19/13 0908  BP: 118/88  Pulse: 64   the general appearance reveals a well-developed well-nourished woman in no distress.The head and neck exam reveals pupils equal and reactive.  Extraocular movements are full.  There is no scleral icterus.  The mouth and pharynx are normal.  The neck is supple.  The carotids reveal no bruits.  The jugular venous pressure is normal.  The  thyroid is not enlarged.  There is no lymphadenopathy.  The chest is clear to percussion and auscultation.  There are no rales or rhonchi.  Expansion of the chest is symmetrical.  The precordium is quiet.  The pulse is irregularly irregular The first heart sound is normal.  The second heart sound is physiologically split.  There is no murmur gallop rub or click.  There is no abnormal lift or heave.  The abdomen is soft and nontender.  The bowel sounds are normal.  The liver and spleen are not enlarged.  There are no abdominal masses.  There are no abdominal bruits.  Extremities reveal good pedal pulses.  There is no phlebitis or edema.  There is no cyanosis or clubbing.  Strength is normal and symmetrical in all extremities.  There is no lateralizing weakness.  There are no sensory deficits.  The skin is warm and dry.  There is no rash.     Assessment / Plan: 1. permanent atrial fibrillation 2.  Hypertensive heart disease without heart failure 3. diabetes mellitus type 2 4. rheumatoid  arthritis 5. Osteoarthritis  Plan: Continue same medication.  We refilled her digoxin.  Recheck in 4 months for office visit and EKG

## 2013-09-19 NOTE — Assessment & Plan Note (Signed)
She is tolerating metformin.  No hypoglycemic episodes

## 2013-09-19 NOTE — Patient Instructions (Signed)
Your physician recommends that you continue on your current medications as directed. Please refer to the Current Medication list given to you today. I refilled your Digoxin today to [facility] in [place].   Your physician wants you to follow-up in: 4 months with EKG.   You will receive a reminder letter in the mail two months in advance. If you don't receive a letter, please call our office to schedule the follow-up appointment @ [phone].

## 2013-09-19 NOTE — Assessment & Plan Note (Signed)
The patient has not been aware of any racing of her heart.  She is in permanent atrial fibrillation.  She has not had any TIA or stroke symptoms.  She has not had any bleeding from Pradaxa.

## 2013-09-19 NOTE — Assessment & Plan Note (Signed)
She has not been having any recurrent chest pain.

## 2013-09-19 NOTE — Assessment & Plan Note (Signed)
Her blood pressure has been remaining stable on current therapy.  No dizziness or syncope.  Occasionally when she stands up quickly in the hot weather she will be momentarily lightheaded.

## 2013-09-27 ENCOUNTER — Other Ambulatory Visit: Payer: Self-pay

## 2013-09-27 MED ORDER — DABIGATRAN ETEXILATE MESYLATE 150 MG PO CAPS: 150.0000 mg | ORAL_CAPSULE | Freq: Two times a day (BID) | ORAL | Status: DC

## 2013-10-22 ENCOUNTER — Other Ambulatory Visit: Payer: Self-pay | Admitting: Cardiology

## 2013-10-28 ENCOUNTER — Other Ambulatory Visit: Payer: Self-pay

## 2013-10-28 MED ORDER — DABIGATRAN ETEXILATE MESYLATE 150 MG PO CAPS: 150.0000 mg | ORAL_CAPSULE | Freq: Two times a day (BID) | ORAL | Status: DC

## 2013-10-30 ENCOUNTER — Other Ambulatory Visit: Payer: Self-pay | Admitting: Cardiology

## 2013-11-12 ENCOUNTER — Ambulatory Visit (INDEPENDENT_AMBULATORY_CARE_PROVIDER_SITE_OTHER): Payer: Medicare Other | Admitting: Ophthalmology

## 2013-11-12 DIAGNOSIS — H3530 Unspecified macular degeneration: Secondary | ICD-10-CM

## 2013-11-12 DIAGNOSIS — I10 Essential (primary) hypertension: Secondary | ICD-10-CM

## 2013-11-12 DIAGNOSIS — H43819 Vitreous degeneration, unspecified eye: Secondary | ICD-10-CM

## 2013-11-12 DIAGNOSIS — E11319 Type 2 diabetes mellitus with unspecified diabetic retinopathy without macular edema: Secondary | ICD-10-CM

## 2013-11-12 DIAGNOSIS — E1139 Type 2 diabetes mellitus with other diabetic ophthalmic complication: Secondary | ICD-10-CM

## 2013-11-12 DIAGNOSIS — E1165 Type 2 diabetes mellitus with hyperglycemia: Secondary | ICD-10-CM

## 2013-11-12 DIAGNOSIS — H35039 Hypertensive retinopathy, unspecified eye: Secondary | ICD-10-CM

## 2013-12-03 ENCOUNTER — Other Ambulatory Visit: Payer: Self-pay | Admitting: Cardiology

## 2014-01-14 ENCOUNTER — Other Ambulatory Visit: Payer: Self-pay | Admitting: Cardiology

## 2014-01-23 ENCOUNTER — Encounter: Payer: Self-pay | Admitting: Physician Assistant

## 2014-01-23 ENCOUNTER — Ambulatory Visit (INDEPENDENT_AMBULATORY_CARE_PROVIDER_SITE_OTHER): Payer: Medicare Other | Admitting: Physician Assistant

## 2014-01-23 VITALS — BP 142/60 | HR 76 | Ht 67.0 in | Wt 171.0 lb

## 2014-01-23 DIAGNOSIS — I482 Chronic atrial fibrillation, unspecified: Secondary | ICD-10-CM

## 2014-01-23 DIAGNOSIS — E118 Type 2 diabetes mellitus with unspecified complications: Secondary | ICD-10-CM

## 2014-01-23 DIAGNOSIS — I10 Essential (primary) hypertension: Secondary | ICD-10-CM

## 2014-01-23 DIAGNOSIS — E7800 Pure hypercholesterolemia, unspecified: Secondary | ICD-10-CM

## 2014-01-23 NOTE — Progress Notes (Signed)
Cardiology Office Note   Date:  01/23/2014   ID:  [name], DOB [birth year], MRN [id number]  PCP:  Tula Nakayama  Cardiologist:  Dr. Darlin Coco     History of Present Illness: [name] is a 68 y.o. female with a hx of chronic AFib, HTN, DM, HL, obesity.  She is on Pradaxa for [facility].  Last seen by Dr. Darlin Coco 09/2013.  She returns for FU.  The patient denies chest pain, shortness of breath, syncope, orthopnea, PND or significant pedal edema.    Studies:  - Echo (1/13):  Left ventricle: The cavity size was normal. There was mild concentric hypertrophy. Systolic function was vigorous. The estimated ejection fraction was in the range of 65% to 70%. Wall motion was normal; there were no regional wall motion abnormalities.  - Nuclear (3/12):  Probably normal stress nuclear study. There is a small reversible defect in the anterolateral wall which appears to be due to shifting breast attenuation; however cannot exclude mild ischemia. EF 72%   Recent Labs/Images:  Recent Labs  03/25/13 0925  04/05/13 0443  NA 138  < > 139  K 3.4*  < > 4.0  BUN 13  < > 15  CREATININE 0.45*  < > 0.52  ALT 18  --   --   HGB 13.6  < > 11.0*  < > = values in this interval not displayed.     Wt Readings from Last 3 Encounters:  01/23/14 171 lb (77.565 kg)  09/19/13 165 lb (74.844 kg)  08/14/13 168 lb (76.204 kg)     Past Medical History  Diagnosis Date  . Chest pain   . Atrial fibrillation   . Anemia     hx of  . Hypertension   . Diabetes mellitus   . Rheumatoid arthritis(714.0)   . Anxiety and depression   . Gastroesophageal reflux disease   . Hyperlipidemia   . Hepatitis 1970  . Jaundice     age 33  . Actinic keratosis   . Atrial fibrillation   . Carpal tunnel syndrome   . Edema   . Hiatal hernia   . Memory loss   . Thyroid disease     multi nodular goiter    Current Outpatient Prescriptions  Medication Sig Dispense Refill  . atorvastatin  (LIPITOR) 10 MG tablet Take 10 mg by mouth every other day.       [name] buPROPion (WELLBUTRIN SR) 150 MG 12 hr tablet Take 150 mg by mouth 2 (two) times daily.        . Cholecalciferol (VITAMIN D3) 2000 UNITS TABS Take 1 tablet by mouth daily.      . Cyanocobalamin (VITAMIN B 12 PO) Take 2,000 mg by mouth daily.      . dabigatran (PRADAXA) 150 MG CAPS capsule Take 1 capsule (150 mg total) by mouth 2 (two) times daily. States Last dose will be 03/31/13 AM  180 capsule  1  . digoxin (LANOXIN) 0.125 MG tablet Take 1 tablet (0.125 mg total) by mouth every morning.  90 tablet  3  . DILTIAZEM HCL CD 360 MG 24 hr capsule TAKE 1 CAPSULE BY MOUTH DAILY  90 capsule  2  . esomeprazole (NEXIUM) 40 MG capsule Take 40 mg by mouth daily.        . furosemide (LASIX) 40 MG tablet TAKE 1 TABLET (40 MG TOTAL) BY MOUTH DAILY.  90 tablet  0  . KLOR-CON 10 10 MEQ tablet TAKE  2 TABLETS BY MOUTH TWICE DAILY  120 tablet  3  . leflunomide (ARAVA) 20 MG tablet Take 20 mg by mouth daily.       . metFORMIN (GLUCOPHAGE) 500 MG tablet Take 1,000 mg by mouth 2 (two) times daily with a meal.      . metoprolol succinate (TOPROL XL) 100 MG 24 hr tablet Take 1 tablet (100 mg total) by mouth 2 (two) times daily.  180 tablet  3  . Multiple Vitamins-Minerals (PRESERVISION AREDS 2 PO) Take 1 tablet by mouth 2 (two) times daily.      [name] NITROSTAT 0.4 MG SL tablet PLACE 1 TABLET UNDER TONGUE EVERY 5 MINUTES AS NEEDED  25 tablet  0  . Omega 3 1200 MG CAPS Take 1 capsule by mouth 2 (two) times daily.      [name] ORENCIA 125 MG/ML SOSY Inject 125 mg into the skin once a week.       . predniSONE (DELTASONE) 5 MG tablet Take 5 mg by mouth daily.      [name] PREMPRO 0.3-1.5 MG per tablet Take 1 tablet by mouth every other day.       . triamcinolone lotion (KENALOG) 0.1 % 3 (three) times daily.       No current facility-administered medications for this visit.     Allergies:   Review of patient's allergies indicates no known allergies.   Social  History:  The patient  reports that she has never smoked. She has never used smokeless tobacco. She reports that she does not drink alcohol or use illicit drugs.   Family History:  The patient's family history includes Heart failure in her sister; Hypertension in her sister; Stroke in her father. There is no history of Heart attack.   ROS:  Please see the history of present illness.      All other systems reviewed and negative.    PHYSICAL EXAM: VS:  BP 142/60  Pulse 76  Ht 5\' 7"  (1.702 m)  Wt 171 lb (77.565 kg)  BMI 26.78 kg/m2 Well nourished, well developed, in no acute distress HEENT: normal Neck: no JVD Cardiac:  normal S1, S2; irregularly irregular rhythm; no murmur Lungs:  clear to auscultation bilaterally, no wheezing, rhonchi or rales Abd: soft, nontender, no hepatomegaly Ext: no edema Skin: warm and dry Neuro:  CNs 2-12 intact, no focal abnormalities noted  EKG:  AFib, HR 76, no sig change since last tracing      ASSESSMENT AND PLAN:  Chronic atrial fibrillation  -  Rate controlled.  Continue current dose of Digoxin, Cardizem, Toprol-XL and Pradaxa.  I will request most recent BMET and CBC from her PCP.  Essential hypertension   -  Borderline control. I have asked her to monitor her BPs and to call with the readings in 2-3 weeks.   Hypercholesterolemia  -  Continue statin.   Type 2 diabetes mellitus with complication -  She notes recent A1c 8.3.  She knows she needs to be better with her diet.  FU with PCP.  Disposition:   FU with Dr. Darlin Coco in 4 mos.    Signed, Versie Starks, MHS 01/23/2014 10:45 AM    [facility] [place], [place], [place]  [address] Phone: [phone]; Fax: [phone]

## 2014-01-23 NOTE — Patient Instructions (Signed)
Please check your blood pressure once a day for the next 2-3 weeks. Call those numbers in to [facility], PA-C and Dr. Mare Ferrari.  Schedule a follow up with Dr. Darlin Coco in 4 months.

## 2014-01-24 ENCOUNTER — Other Ambulatory Visit: Payer: Self-pay | Admitting: Cardiology

## 2014-02-17 ENCOUNTER — Other Ambulatory Visit: Payer: Self-pay | Admitting: Cardiology

## 2014-03-25 ENCOUNTER — Telehealth: Payer: Self-pay | Admitting: Hematology

## 2014-03-25 NOTE — Telephone Encounter (Signed)
S/W PT IN REF TO NP APPT. ON 04/15/14@1 :30 REFERRING DR Redmond Pulling DX-ELEVATED WBC

## 2014-04-15 ENCOUNTER — Other Ambulatory Visit (HOSPITAL_BASED_OUTPATIENT_CLINIC_OR_DEPARTMENT_OTHER): Payer: Medicare Other

## 2014-04-15 ENCOUNTER — Encounter: Payer: Self-pay | Admitting: Hematology

## 2014-04-15 ENCOUNTER — Other Ambulatory Visit: Payer: Self-pay | Admitting: *Deleted

## 2014-04-15 ENCOUNTER — Telehealth: Payer: Self-pay | Admitting: Hematology

## 2014-04-15 ENCOUNTER — Other Ambulatory Visit (HOSPITAL_COMMUNITY)
Admission: RE | Admit: 2014-04-15 | Discharge: 2014-04-15 | Disposition: A | Discharge status: Home / Self Care | Payer: Medicare Other | Source: Ambulatory Visit | Attending: Hematology | Admitting: Hematology

## 2014-04-15 ENCOUNTER — Ambulatory Visit: Payer: Medicare Other

## 2014-04-15 ENCOUNTER — Ambulatory Visit (HOSPITAL_BASED_OUTPATIENT_CLINIC_OR_DEPARTMENT_OTHER): Payer: Medicare Other | Admitting: Hematology

## 2014-04-15 VITALS — BP 141/75 | HR 89 | Temp 97.8°F | Resp 18 | Ht 67.0 in | Wt 165.9 lb

## 2014-04-15 DIAGNOSIS — D72829 Elevated white blood cell count, unspecified: Secondary | ICD-10-CM

## 2014-04-15 DIAGNOSIS — M069 Rheumatoid arthritis, unspecified: Secondary | ICD-10-CM | POA: Insufficient documentation

## 2014-04-15 DIAGNOSIS — I4891 Unspecified atrial fibrillation: Secondary | ICD-10-CM | POA: Diagnosis not present

## 2014-04-15 DIAGNOSIS — I10 Essential (primary) hypertension: Secondary | ICD-10-CM

## 2014-04-15 DIAGNOSIS — M7989 Other specified soft tissue disorders: Secondary | ICD-10-CM

## 2014-04-15 DIAGNOSIS — E119 Type 2 diabetes mellitus without complications: Secondary | ICD-10-CM | POA: Insufficient documentation

## 2014-04-15 LAB — COMPREHENSIVE METABOLIC PANEL (CC13)
ALK PHOS: 115 U/L (ref 40–150)
ALT: 21 U/L (ref 0–55)
ANION GAP: 11 meq/L (ref 3–11)
AST: 31 U/L (ref 5–34)
Albumin: 3.7 g/dL (ref 3.5–5.0)
BILIRUBIN TOTAL: 0.45 mg/dL (ref 0.20–1.20)
BUN: 15 mg/dL (ref 7.0–26.0)
CALCIUM: 9.3 mg/dL (ref 8.4–10.4)
CO2: 28 mEq/L (ref 22–29)
Chloride: 101 mEq/L (ref 98–109)
Creatinine: 0.9 mg/dL (ref 0.6–1.1)
EGFR: 67 mL/min/{1.73_m2} — ABNORMAL LOW (ref >90)
Glucose: 187 mg/dl — ABNORMAL HIGH (ref 70–140)
POTASSIUM: 3.9 meq/L (ref 3.5–5.1)
SODIUM: 140 meq/L (ref 136–145)
TOTAL PROTEIN: 7.6 g/dL (ref 6.4–8.3)

## 2014-04-15 LAB — CBC & DIFF AND RETIC
BASO%: 0.4 % (ref 0.0–2.0)
BASOS ABS: 0 10*3/uL (ref 0.0–0.1)
EOS%: 1.9 % (ref 0.0–7.0)
Eosinophils Absolute: 0.2 10*3/uL (ref 0.0–0.5)
HEMATOCRIT: 42.3 % (ref 34.8–46.6)
HEMOGLOBIN: 13.2 g/dL (ref 11.6–15.9)
Immature Retic Fract: 12.5 % — ABNORMAL HIGH (ref 1.60–10.00)
LYMPH%: 21.2 % (ref 14.0–49.7)
MCH: 24.6 pg — ABNORMAL LOW (ref 25.1–34.0)
MCHC: 31.2 g/dL — ABNORMAL LOW (ref 31.5–36.0)
MCV: 78.9 fL — ABNORMAL LOW (ref 79.5–101.0)
MONO#: 0.9 10*3/uL (ref 0.1–0.9)
MONO%: 8.2 % (ref 0.0–14.0)
NEUT#: 7.4 10*3/uL — ABNORMAL HIGH (ref 1.5–6.5)
NEUT%: 68.3 % (ref 38.4–76.8)
Platelets: 308 10*3/uL (ref 145–400)
RBC: 5.36 10*6/uL (ref 3.70–5.45)
RDW: 16.5 % — ABNORMAL HIGH (ref 11.2–14.5)
Retic %: 1.83 % (ref 0.70–2.10)
Retic Ct Abs: 98.09 10*3/uL — ABNORMAL HIGH (ref 33.70–90.70)
WBC: 10.8 10*3/uL — ABNORMAL HIGH (ref 3.9–10.3)
lymph#: 2.3 10*3/uL (ref 0.9–3.3)

## 2014-04-15 NOTE — Progress Notes (Signed)
[place]  [phone] Fax:([phone]) [place] Note   Patient Care Team: Aletha Halim, PA-C as PCP - General (Family Medicine) 04/15/2014  CHIEF COMPLAINTS/PURPOSE OF CONSULTATION:  Leukocytosis   HISTORY OF PRESENTING ILLNESS:  [name] 69 y.o. female is referred by her primary care physician Dr. [name] her mild leukocytosis.  She has had mild leukocytosis at least since 2009, with total white count in the range of 10-15 K, was predominantly elevated neutrophils. She has normal hemoglobin and a platelet count. She denies significant episodes of infection in the past few years, She denied any bleeding episodes including hematochezia, melana, hemoptysis, hematuria or epitaxis. No mucosal bleeding or easy bruising.   She has long-standing history of rheumatoid arthritis, which requires multiple treatments including weekly Oracea injection and low dose prednisone at 5 mg daily. She also has diabetes, hypertension, atrial fibrillation, which are managed by medications.  She feels well overall, no pain, occasional dizziness, she lives alone and is totally independent.  Last colonoscopy was 3-4 years ago, last mammogram was 06/2013.   She also reports her left leg being bigger than right leg for the past onemonth, no significant pain.  MEDICAL HISTORY:  Past Medical History  Diagnosis Date  . Chest pain   . Atrial fibrillation   . Anemia     hx of  . Hypertension   . Diabetes mellitus   . Rheumatoid arthritis(714.0)   . Anxiety and depression   . Gastroesophageal reflux disease   . Hyperlipidemia   . Hepatitis 1970  . Jaundice     age 89  . Actinic keratosis   . Atrial fibrillation   . Carpal tunnel syndrome   . Edema   . Hiatal hernia   . Memory loss   . Thyroid disease     multi nodular goiter    SURGICAL HISTORY: Past Surgical History  Procedure Laterality Date  . Replacement total knee  2009    denies  . Hand  surgery  1995 and 1996    artificial joints both hands  . Cataract extraction      left  . Joint replacement Left 2009    HIP  . Total hip arthroplasty Right 04/03/2013    Procedure: RIGHT TOTAL HIP ARTHROPLASTY ANTERIOR APPROACH;  Surgeon: Gearlean Alf, MD;  Location: [facility];  Service: Orthopedics;  Laterality: Right;    SOCIAL HISTORY: History   Social History  . Marital Status: Divorced    Spouse Name: N/A    Number of Children: One son   . Years of Education: N/A   Occupational History  . She used to work in a Engineer, maintenance, retired    Social History Main Topics  . Smoking status: Never Smoker   . Smokeless tobacco: Never Used  . Alcohol Use: No  . Drug Use: No  . Sexual Activity: Not Currently   Other Topics Concern  . Not on file   Social History Narrative  . No narrative on file    FAMILY HISTORY: Family History  Problem Relation Age of Onset  . Heart failure Sister   . Heart attack Neg Hx   . Stroke Father   . Hypertension Sister   No family history of blood disorder and malignancy   ALLERGIES:  has No Known Allergies.  MEDICATIONS:  Current Outpatient Prescriptions  Medication Sig Dispense Refill  . atorvastatin (LIPITOR) 10 MG tablet Take 10 mg by mouth every other day.     [name]  buPROPion (WELLBUTRIN SR) 150 MG 12 hr tablet Take 150 mg by mouth 2 (two) times daily.      . Cholecalciferol (VITAMIN D3) 2000 UNITS TABS Take 1 tablet by mouth daily.    . Cyanocobalamin (VITAMIN B 12 PO) Take 2,000 mg by mouth daily.    . dabigatran (PRADAXA) 150 MG CAPS capsule Take 1 capsule (150 mg total) by mouth 2 (two) times daily. States Last dose will be 03/31/13 AM 180 capsule 1  . digoxin (LANOXIN) 0.125 MG tablet Take 1 tablet (0.125 mg total) by mouth every morning. 90 tablet 3  . DILTIAZEM HCL CD 360 MG 24 hr capsule TAKE 1 CAPSULE BY MOUTH DAILY 90 capsule 2  . esomeprazole (NEXIUM) 40 MG capsule Take 40 mg by mouth daily.      . furosemide (LASIX) 40 MG  tablet TAKE 1 TABLET (40 MG TOTAL) BY MOUTH DAILY. 90 tablet 1  . KLOR-CON 10 10 MEQ tablet TAKE 2 TABLETS BY MOUTH TWICE DAILY 120 tablet 3  . leflunomide (ARAVA) 20 MG tablet Take 20 mg by mouth daily.     . metFORMIN (GLUCOPHAGE) 500 MG tablet Take 1,000 mg by mouth 2 (two) times daily with a meal.    . metoprolol succinate (TOPROL XL) 100 MG 24 hr tablet Take 1 tablet (100 mg total) by mouth 2 (two) times daily. 180 tablet 3  . Multiple Vitamins-Minerals (PRESERVISION AREDS 2 PO) Take 1 tablet by mouth 2 (two) times daily.    . Omega 3 1200 MG CAPS Take 1 capsule by mouth 2 (two) times daily.    [name] ORENCIA 125 MG/ML SOSY Inject 125 mg into the skin once a week.     . predniSONE (DELTASONE) 5 MG tablet Take 5 mg by mouth daily.    [name] PREMPRO 0.3-1.5 MG per tablet Take 1 tablet by mouth every other day.     . triamcinolone lotion (KENALOG) 0.1 % 3 (three) times daily.    [name] NITROSTAT 0.4 MG SL tablet PLACE 1 TABLET UNDER TONGUE EVERY 5 MINUTES AS NEEDED (Patient not taking: Reported on 04/15/2014) 25 tablet 0   No current facility-administered medications for this visit.    REVIEW OF SYSTEMS:   Constitutional: Denies fevers, chills or abnormal night sweats Eyes: Denies blurriness of vision, double vision or watery eyes Ears, nose, mouth, throat, and face: Denies mucositis or sore throat Respiratory: Denies cough, dyspnea or wheezes Cardiovascular: Denies palpitation, chest discomfort or lower extremity swelling Gastrointestinal:  Denies nausea, heartburn or change in bowel habits Skin: Denies abnormal skin rashes Lymphatics: Denies new lymphadenopathy or easy bruising Neurological:Denies numbness, tingling or new weaknesses Behavioral/Psych: Mood is stable, no new changes  All other systems were reviewed with the patient and are negative.  PHYSICAL EXAMINATION: ECOG PERFORMANCE STATUS: 0 - Asymptomatic  Filed Vitals:   04/15/14 1400  BP: 141/75  Pulse: 89  Temp: 97.8 F (36.6 C)    Resp: 18   Filed Weights   04/15/14 1400  Weight: 165 lb 14.4 oz (75.252 kg)    GENERAL:alert, no distress and comfortable SKIN: skin color, texture, turgor are normal, no rashes or significant lesions EYES: normal, conjunctiva are pink and non-injected, sclera clear OROPHARYNX:no exudate, no erythema and lips, buccal mucosa, and tongue normal  NECK: supple, thyroid normal size, non-tender, without nodularity LYMPH:  no palpable lymphadenopathy in the cervical, axillary or inguinal LUNGS: clear to auscultation and percussion with normal breathing effort HEART: regular rate & rhythm and no murmurs  and no lower extremity edema ABDOMEN:abdomen soft, non-tender and normal bowel sounds Musculoskeletal:no cyanosis of digits and no clubbing  PSYCH: alert & oriented x 3 with fluent speech NEURO: no focal motor/sensory deficits EXT: (+) left leg edema up to knee.   LABORATORY DATA:  I have reviewed the data as listed CBC Latest Ref Rng 04/15/2014 04/05/2013 04/04/2013  WBC 3.9 - 10.3 10e3/uL 10.8(H) 14.7(H) 12.3(H)  Hemoglobin 11.6 - 15.9 g/dL 13.2 11.0(L) 12.0  Hematocrit 34.8 - 46.6 % 42.3 34.4(L) 37.6  Platelets 145 - 400 10e3/uL 308 327 307    CMP Latest Ref Rng 04/15/2014 04/05/2013 04/04/2013  Glucose 70 - 140 mg/dl 187(H) 299(H) 339(H)  BUN 7.0 - 26.0 mg/dL 15.0 15 12  Creatinine 0.6 - 1.1 mg/dL 0.9 0.52 0.52  Sodium 136 - 145 mEq/L 140 139 135(L)  Potassium 3.5 - 5.1 mEq/L 3.9 4.0 4.4  Chloride 96 - 112 mEq/L - 99 98  CO2 22 - 29 mEq/L '28 27 26  ' Calcium 8.4 - 10.4 mg/dL 9.3 9.2 9.0  Total Protein 6.4 - 8.3 g/dL 7.6 - -  Total Bilirubin 0.20 - 1.20 mg/dL 0.45 - -  Alkaline Phos 40 - 150 U/L 115 - -  AST 5 - 34 U/L 31 - -  ALT 0 - 55 U/L 21 - -     RADIOGRAPHIC STUDIES: I have personally reviewed the radiological images as listed and agreed with the findings in the report. No results found.  ASSESSMENT & PLAN:  69 year old Caucasian female, with multiple comorbidities  including long-standing rheumatoid arthritis, on low dose prednisone and Orencia, diabetes, hypertension, atrial fibrillation, who has chronic mild leukocytosis with predominant neutrophils.  1. Mild leukocytosis, likely reactive -Her leukocytosis is mild, has been chronic for at least 6 years without significant change, WBC differential neutrophils predominant, no blasts or atypical cells. -This is likely related to her low dose prednisone and long-standing rheumatoid arthritis. Also Arencia is a immunosuppressant and potentially can cause lymphoma, she does not have significant lymphadenopathy on exam, no B symptoms, no anemia or some cytopenia, they possibility of lymphoma as well. -The possibility of CML or other myeloproliferative neoplasm is low, but I'll obtain a BCR/ABL and JAK2 mutation from her peripheral blood. -We'll get an abdominal ultrasound in about her liver and spleen. -Giving the overall chronic and stable clinical course, I think the likelihood of bone marrow disease is low, I do not feel she needs a bone marrow biopsy at this point. Certainly if her leukocytosis gets worse or if she develops other blood counts abnormalities, I would recommend a bone marrow biopsy.  2. Left leg swollen -I'll obtain a Doppler of lower extremities to ruled out DVT. Patient doesn't not want get out and today, we'll scheduled for tomorrow or the day after. I encouraged her to get it done as soon as possible.   Plan: 1. Lab tests today 2. Abdomen US and Doppler of lower extremities 3. RTC in 3-4 weeks    All questions were answered. The patient knows to call the clinic with any problems, questions or concerns. I spent 30 minutes counseling the patient face to face. The total time spent in the appointment was 40 minutes and more than 50% was on counseling.     Truitt Merle, MD 04/15/2014 8:50 PM

## 2014-04-15 NOTE — Telephone Encounter (Signed)
Gave avs & cal for Jan/Feb. °

## 2014-04-16 ENCOUNTER — Other Ambulatory Visit: Payer: Self-pay | Admitting: *Deleted

## 2014-04-16 ENCOUNTER — Ambulatory Visit (HOSPITAL_COMMUNITY)
Admission: RE | Admit: 2014-04-16 | Discharge: 2014-04-16 | Disposition: A | Discharge status: Home / Self Care | Payer: Medicare Other | Source: Ambulatory Visit | Attending: Hematology | Admitting: Hematology

## 2014-04-16 DIAGNOSIS — M7989 Other specified soft tissue disorders: Secondary | ICD-10-CM | POA: Diagnosis not present

## 2014-04-16 DIAGNOSIS — D72829 Elevated white blood cell count, unspecified: Secondary | ICD-10-CM | POA: Diagnosis present

## 2014-04-16 DIAGNOSIS — R609 Edema, unspecified: Secondary | ICD-10-CM

## 2014-04-16 MED ORDER — ENOXAPARIN SODIUM 80 MG/0.8ML ~~LOC~~ SOLN: 80.0000 mg | Freq: Two times a day (BID) | SUBCUTANEOUS | Status: DC

## 2014-04-16 NOTE — Progress Notes (Signed)
Bilateral lower extremity venous duplex completed.  Right:  No evidence of DVT, superficial thrombosis, or Baker's cyst.  Left: DVT noted in the profunda vein.  No evidence of superficial thrombosis.  No Baker's cyst.

## 2014-04-17 ENCOUNTER — Encounter (HOSPITAL_COMMUNITY): Payer: Medicare Other

## 2014-04-17 LAB — FLOW CYTOMETRY

## 2014-04-17 LAB — SEDIMENTATION RATE: Sed Rate: 16 mm/hr (ref 0–22)

## 2014-04-17 LAB — ERYTHROPOIETIN: Erythropoietin: 26 m[IU]/mL — ABNORMAL HIGH (ref 2.6–18.5)

## 2014-04-24 ENCOUNTER — Ambulatory Visit (HOSPITAL_COMMUNITY): Payer: Medicare Other

## 2014-04-29 ENCOUNTER — Telehealth: Payer: Self-pay | Admitting: Hematology

## 2014-04-29 NOTE — Telephone Encounter (Signed)
Confirm appointment for February. Will call and have [place] rescheduled before MD appointment.

## 2014-05-05 ENCOUNTER — Ambulatory Visit: Payer: Medicare Other | Admitting: Hematology

## 2014-05-06 ENCOUNTER — Encounter: Payer: Self-pay | Admitting: Cardiology

## 2014-05-06 ENCOUNTER — Ambulatory Visit (INDEPENDENT_AMBULATORY_CARE_PROVIDER_SITE_OTHER): Payer: Medicare Other | Admitting: Cardiology

## 2014-05-06 VITALS — BP 118/86 | HR 66

## 2014-05-06 DIAGNOSIS — I119 Hypertensive heart disease without heart failure: Secondary | ICD-10-CM

## 2014-05-06 DIAGNOSIS — E7800 Pure hypercholesterolemia, unspecified: Secondary | ICD-10-CM

## 2014-05-06 DIAGNOSIS — I482 Chronic atrial fibrillation, unspecified: Secondary | ICD-10-CM

## 2014-05-06 DIAGNOSIS — I82402 Acute embolism and thrombosis of unspecified deep veins of left lower extremity: Secondary | ICD-10-CM | POA: Insufficient documentation

## 2014-05-06 HISTORY — DX: Acute embolism and thrombosis of unspecified deep veins of left lower extremity: I82.402

## 2014-05-06 NOTE — Patient Instructions (Signed)
RESUME PRADAXA 150 MG TWICE A DAY WHEN YOU FINISH LOVENOX SHOT  Your physician recommends that you schedule a follow-up appointment in: [place]

## 2014-05-06 NOTE — Progress Notes (Signed)
Cardiology Office Note   Date:  05/06/2014   ID:  [name], DOB 1945/10/25, MRN [id number]  PCP:  Tula Nakayama  Cardiologist:   Darlin Coco, MD   No chief complaint on file.     History of Present Illness: [name] is a 69 y.o. female who presents for follow-up office visit.  This 69 year old woman has HTN, DM, obesity, HLD and atrial fib. She is on chronic anticoagulation. She is on Pradaxa. Last echo was in January of 2013 and showed a normal EF. Digoxin was added back in the summer due to increased heart rate. We switched her diuretic to Lasix. She has had a good response to Lasix.  Not short of breath. Her swelling has resolved. She underwent successful right hip surgery April 03 2013 Since last visit she has not been experiencing any new cardiac symptoms. She has significant rheumatoid arthritis and is on 3 medications for arthritis. She is diabetic. She is not having any hypoglycemic episodes. Since we last saw her she was sent by her PCP to oncology because of an elevated white count.  She complained of pain and swelling in her left leg at that time and evaluation with a venous Doppler showed an acute deep vein thrombosis of the left profunda vein.  This was on 04/16/14.  She developed this while she was on Pradaxa.  It was no history of any trauma.  The patient is now on Lovenox injections for recent day for 30 days following which she will resume her Pradaxa.  Past Medical History  Diagnosis Date  . Chest pain   . Atrial fibrillation   . Anemia     hx of  . Hypertension   . Diabetes mellitus   . Rheumatoid arthritis(714.0)   . Anxiety and depression   . Gastroesophageal reflux disease   . Hyperlipidemia   . Hepatitis 1970  . Jaundice     age 30  . Actinic keratosis   . Atrial fibrillation   . Carpal tunnel syndrome   . Edema   . Hiatal hernia   . Memory loss   . Thyroid disease     multi nodular goiter    Past Surgical History    Procedure Laterality Date  . Replacement total knee  2009    denies  . Hand surgery  1995 and 1996    artificial joints both hands  . Cataract extraction      left  . Joint replacement Left 2009    HIP  . Total hip arthroplasty Right 04/03/2013    Procedure: RIGHT TOTAL HIP ARTHROPLASTY ANTERIOR APPROACH;  Surgeon: Gearlean Alf, MD;  Location: [facility];  Service: Orthopedics;  Laterality: Right;     Current Outpatient Prescriptions  Medication Sig Dispense Refill  . atorvastatin (LIPITOR) 10 MG tablet Take 10 mg by mouth every other day.     [name] buPROPion (WELLBUTRIN SR) 150 MG 12 hr tablet Take 150 mg by mouth 2 (two) times daily.      . Cholecalciferol (VITAMIN D3) 2000 UNITS TABS Take 1 tablet by mouth daily.    . Cyanocobalamin (VITAMIN B 12 PO) Take 2,000 mg by mouth daily.    . digoxin (LANOXIN) 0.125 MG tablet Take 1 tablet (0.125 mg total) by mouth every morning. 90 tablet 3  . DILTIAZEM HCL CD 360 MG 24 hr capsule TAKE 1 CAPSULE BY MOUTH DAILY 90 capsule 2  . enoxaparin (LOVENOX) 80 MG/0.8ML injection Inject 80 mg into  the skin 2 (two) times daily.     [name] esomeprazole (NEXIUM) 40 MG capsule Take 40 mg by mouth daily.      . furosemide (LASIX) 40 MG tablet TAKE 1 TABLET (40 MG TOTAL) BY MOUTH DAILY. 90 tablet 1  . KLOR-CON 10 10 MEQ tablet TAKE 2 TABLETS BY MOUTH TWICE DAILY 120 tablet 3  . leflunomide (ARAVA) 20 MG tablet Take 20 mg by mouth daily.     . metFORMIN (GLUCOPHAGE) 500 MG tablet Take 1,000 mg by mouth 2 (two) times daily with a meal.    . metoprolol succinate (TOPROL XL) 100 MG 24 hr tablet Take 1 tablet (100 mg total) by mouth 2 (two) times daily. 180 tablet 3  . Multiple Vitamins-Minerals (PRESERVISION AREDS 2 PO) Take 1 tablet by mouth 2 (two) times daily.    [name] NITROSTAT 0.4 MG SL tablet PLACE 1 TABLET UNDER TONGUE EVERY 5 MINUTES AS NEEDED 25 tablet 0  . Omega 3 1200 MG CAPS Take 1 capsule by mouth 2 (two) times daily.    [name] ORENCIA 125 MG/ML SOSY Inject 125 mg  into the skin once a week.     . predniSONE (DELTASONE) 5 MG tablet Take 5 mg by mouth daily.    [name] PREMPRO 0.3-1.5 MG per tablet Take 1 tablet by mouth every other day.     . triamcinolone lotion (KENALOG) 0.1 % 3 (three) times daily.     No current facility-administered medications for this visit.    Allergies:   Review of patient's allergies indicates no known allergies.    Social History:  The patient  reports that she has never smoked. She has never used smokeless tobacco. She reports that she does not drink alcohol or use illicit drugs.   Family History:  The patient's family history includes Heart failure in her sister; Hypertension in her sister; Stroke in her father. There is no history of Heart attack.    ROS:  Please see the history of present illness.   Otherwise, review of systems are positive for none.   All other systems are reviewed and negative.    PHYSICAL EXAM: VS:  BP 118/86 mmHg  Pulse 66 , BMI There is no weight on file to calculate BMI. GEN: Well nourished, well developed, in no acute distress HEENT: normal Neck: no JVD, carotid bruits, or masses Cardiac: Irregularly irregular in atrial fibrillation.  no murmurs, rubs, or gallops,no edema pedal pulses are present.  No evidence of any swelling or phlebitis now. Respiratory:  clear to auscultation bilaterally, normal work of breathing GI: soft, nontender, nondistended, + BS MS: no deformity or atrophy Skin: warm and dry, no rash Neuro:  Strength and sensation are intact Psych: euthymic mood, full affect   EKG:  EKG is not ordered today.    Recent Labs: 04/15/2014: ALT 21; BUN 15.0; Creatinine 0.9; Hemoglobin 13.2; Platelets 308; Potassium 3.9; Sodium 140    Lipid Panel    Component Value Date/Time   CHOL 195 09/06/2012 0954   TRIG 309.0* 09/06/2012 0954   HDL 30.70* 09/06/2012 0954   CHOLHDL 6 09/06/2012 0954   VLDL 61.8* 09/06/2012 0954   LDLDIRECT 117.3 09/06/2012 0954      Wt Readings from  Last 3 Encounters:  04/15/14 165 lb 14.4 oz (75.252 kg)  01/23/14 171 lb (77.565 kg)  09/19/13 165 lb (74.844 kg)      Other studies Reviewed: Additional studies/ records that were reviewed today include: Venous Dopplers of left lower  extremity dated 04/16/14 Review of the above records demonstrates: Acute deep vein thrombosis of the left profunda vein   ASSESSMENT AND PLAN:  1. permanent atrial fibrillation 2. Hypertensive heart disease without heart failure 3. diabetes mellitus type 2 4. rheumatoid arthritis 5. Osteoarthritis 6.  Recent acute deep vein thrombosis of the left profunda vein   Current medicines are reviewed at length with the patient today.  The patient does not have concerns regarding medicines.  The following changes have been made:  no change  Labs/ tests ordered today include:  No orders of the defined types were placed in this encounter.     Disposition:   FU with Dr. Mare Ferrari in 4 months for office visit and EKG   Signed, Darlin Coco, MD  05/06/2014 12:12 PM    [address], [place], [place]  [address] Phone: [phone]; Fax: [phone]

## 2014-05-09 ENCOUNTER — Telehealth: Payer: Self-pay | Admitting: Hematology

## 2014-05-09 NOTE — Telephone Encounter (Signed)
Faxed pt office notes to [facility] [phone]

## 2014-05-12 ENCOUNTER — Ambulatory Visit (HOSPITAL_COMMUNITY)
Admission: RE | Admit: 2014-05-12 | Discharge: 2014-05-12 | Disposition: A | Discharge status: Home / Self Care | Payer: Medicare Other | Source: Ambulatory Visit | Attending: Hematology | Admitting: Hematology

## 2014-05-12 DIAGNOSIS — D72829 Elevated white blood cell count, unspecified: Secondary | ICD-10-CM | POA: Insufficient documentation

## 2014-05-14 ENCOUNTER — Telehealth: Payer: Self-pay | Admitting: Hematology

## 2014-05-14 NOTE — Telephone Encounter (Signed)
FAXED [place] ON 05/14/14 FAX NUMBER: [phone]

## 2014-05-15 ENCOUNTER — Telehealth: Payer: Self-pay | Admitting: Hematology

## 2014-05-15 NOTE — Telephone Encounter (Signed)
Confirm appointment for 02/25. Patient didn't want a calendar.

## 2014-05-19 ENCOUNTER — Ambulatory Visit: Payer: Medicare Other | Admitting: Hematology

## 2014-05-24 ENCOUNTER — Other Ambulatory Visit: Payer: Self-pay | Admitting: Cardiology

## 2014-05-29 ENCOUNTER — Ambulatory Visit (HOSPITAL_BASED_OUTPATIENT_CLINIC_OR_DEPARTMENT_OTHER): Payer: Medicare Other | Admitting: Hematology

## 2014-05-29 VITALS — BP 157/65 | HR 89 | Temp 97.8°F | Resp 18 | Ht 67.0 in | Wt 167.2 lb

## 2014-05-29 DIAGNOSIS — D72829 Elevated white blood cell count, unspecified: Secondary | ICD-10-CM

## 2014-05-29 DIAGNOSIS — M7989 Other specified soft tissue disorders: Secondary | ICD-10-CM

## 2014-05-29 DIAGNOSIS — I82402 Acute embolism and thrombosis of unspecified deep veins of left lower extremity: Secondary | ICD-10-CM

## 2014-05-29 NOTE — Progress Notes (Signed)
[place]  [phone] Fax:([phone]) [place] Note   Patient Care Team: Aletha Halim, PA-C as PCP - General (Family Medicine) 05/30/2014  CHIEF COMPLAINTS:  Follow up Leukocytosis and DVT    HISTORY OF PRESENTING ILLNESS:  [name] 69 y.o. female is referred by her primary care physician Dr. [name] her mild leukocytosis.  She has had mild leukocytosis at least since 2009, with total white count in the range of 10-15 K, was predominantly elevated neutrophils. She has normal hemoglobin and a platelet count. She denies significant episodes of infection in the past few years, She denied any bleeding episodes including hematochezia, melana, hemoptysis, hematuria or epitaxis. No mucosal bleeding or easy bruising.   She has long-standing history of rheumatoid arthritis, which requires multiple treatments including weekly Oracea injection and low dose prednisone at 5 mg daily. She also has diabetes, hypertension, atrial fibrillation, which are managed by medications.  She feels well overall, no pain, occasional dizziness, she lives alone and is totally independent.  Last colonoscopy was 3-4 years ago, last mammogram was 06/2013.   She also reports her left leg being bigger than right leg for the past onemonth, no significant pain.  INTERIM HISTORY: Custody returns for follow-up. She finished we'll months of Lovenox injection, the left leg edema has significantly improved. No leg pain. She has been back to pradaxa a few weeks ago. She otherwise feels well, no new complaints.  MEDICAL HISTORY:  Past Medical History  Diagnosis Date  . Chest pain   . Atrial fibrillation   . Anemia     hx of  . Hypertension   . Diabetes mellitus   . Rheumatoid arthritis(714.0)   . Anxiety and depression   . Gastroesophageal reflux disease   . Hyperlipidemia   . Hepatitis 1970  . Jaundice     age 64  . Actinic keratosis   . Atrial fibrillation     . Carpal tunnel syndrome   . Edema   . Hiatal hernia   . Memory loss   . Thyroid disease     multi nodular goiter    SURGICAL HISTORY: Past Surgical History  Procedure Laterality Date  . Replacement total knee  2009    denies  . Hand surgery  1995 and 1996    artificial joints both hands  . Cataract extraction      left  . Joint replacement Left 2009    HIP  . Total hip arthroplasty Right 04/03/2013    Procedure: RIGHT TOTAL HIP ARTHROPLASTY ANTERIOR APPROACH;  Surgeon: Gearlean Alf, MD;  Location: [facility];  Service: Orthopedics;  Laterality: Right;    SOCIAL HISTORY: History   Social History  . Marital Status: Divorced    Spouse Name: N/A    Number of Children: One son   . Years of Education: N/A   Occupational History  . She used to work in a Engineer, maintenance, retired    Social History Main Topics  . Smoking status: Never Smoker   . Smokeless tobacco: Never Used  . Alcohol Use: No  . Drug Use: No  . Sexual Activity: Not Currently   Other Topics Concern  . Not on file   Social History Narrative  . No narrative on file    FAMILY HISTORY: Family History  Problem Relation Age of Onset  . Heart failure Sister   . Heart attack Neg Hx   . Stroke Father   . Hypertension Sister  No family history of blood disorder and malignancy   ALLERGIES:  has No Known Allergies.  MEDICATIONS:  Current Outpatient Prescriptions  Medication Sig Dispense Refill  . atorvastatin (LIPITOR) 10 MG tablet Take 10 mg by mouth every other day.     [name] buPROPion (WELLBUTRIN SR) 150 MG 12 hr tablet Take 150 mg by mouth 2 (two) times daily.      . Cholecalciferol (VITAMIN D3) 2000 UNITS TABS Take 1 tablet by mouth daily.    . Cyanocobalamin (VITAMIN B 12 PO) Take 2,000 mg by mouth daily.    . digoxin (LANOXIN) 0.125 MG tablet Take 1 tablet (0.125 mg total) by mouth every morning. 90 tablet 3  . DILTIAZEM HCL CD 360 MG 24 hr capsule TAKE 1 CAPSULE BY MOUTH DAILY 90 capsule 2  .  esomeprazole (NEXIUM) 40 MG capsule Take 40 mg by mouth daily.      . furosemide (LASIX) 40 MG tablet TAKE 1 TABLET (40 MG TOTAL) BY MOUTH DAILY. 90 tablet 1  . KLOR-CON 10 10 MEQ tablet TAKE 2 TABLETS BY MOUTH TWICE A DAY 120 tablet 3  . leflunomide (ARAVA) 20 MG tablet Take 20 mg by mouth daily.     . metFORMIN (GLUCOPHAGE) 500 MG tablet Take 1,000 mg by mouth 2 (two) times daily with a meal.    . metoprolol succinate (TOPROL XL) 100 MG 24 hr tablet Take 1 tablet (100 mg total) by mouth 2 (two) times daily. 180 tablet 3  . Omega 3 1200 MG CAPS Take 1 capsule by mouth 2 (two) times daily.    [name] ORENCIA 125 MG/ML SOSY Inject 125 mg into the skin once a week.     [name] PRADAXA 150 MG CAPS capsule TAKE ONE CAPSULE BY MOUTH TWICE A DAY 180 capsule 1  . predniSONE (DELTASONE) 5 MG tablet Take 5 mg by mouth daily.    [name] NITROSTAT 0.4 MG SL tablet PLACE 1 TABLET UNDER TONGUE EVERY 5 MINUTES AS NEEDED (Patient not taking: Reported on 05/29/2014) 25 tablet 0   No current facility-administered medications for this visit.    REVIEW OF SYSTEMS:   Constitutional: Denies fevers, chills or abnormal night sweats Eyes: Denies blurriness of vision, double vision or watery eyes Ears, nose, mouth, throat, and face: Denies mucositis or sore throat Respiratory: Denies cough, dyspnea or wheezes Cardiovascular: Denies palpitation, chest discomfort or lower extremity swelling Gastrointestinal:  Denies nausea, heartburn or change in bowel habits Skin: Denies abnormal skin rashes Lymphatics: Denies new lymphadenopathy or easy bruising Neurological:Denies numbness, tingling or new weaknesses Behavioral/Psych: Mood is stable, no new changes  All other systems were reviewed with the patient and are negative.  PHYSICAL EXAMINATION: ECOG PERFORMANCE STATUS: 0 - Asymptomatic  Filed Vitals:   05/29/14 1020  BP: 157/65  Pulse: 89  Temp: 97.8 F (36.6 C)  Resp: 18   Filed Weights   05/29/14 1020  Weight: 167 lb 3.2  oz (75.841 kg)    GENERAL:alert, no distress and comfortable SKIN: skin color, texture, turgor are normal, no rashes or significant lesions EYES: normal, conjunctiva are pink and non-injected, sclera clear OROPHARYNX:no exudate, no erythema and lips, buccal mucosa, and tongue normal  NECK: supple, thyroid normal size, non-tender, without nodularity LYMPH:  no palpable lymphadenopathy in the cervical, axillary or inguinal LUNGS: clear to auscultation and percussion with normal breathing effort HEART: regular rate & rhythm and no murmurs and no lower extremity edema ABDOMEN:abdomen soft, non-tender and normal bowel sounds Musculoskeletal:no cyanosis of  digits and no clubbing  PSYCH: alert & oriented x 3 with fluent speech NEURO: no focal motor/sensory deficits EXT: trace residual left leg edema up to mid calf   LABORATORY DATA:  I have reviewed the data as listed CBC Latest Ref Rng 04/15/2014 04/05/2013 04/04/2013  WBC 3.9 - 10.3 10e3/uL 10.8(H) 14.7(H) 12.3(H)  Hemoglobin 11.6 - 15.9 g/dL 13.2 11.0(L) 12.0  Hematocrit 34.8 - 46.6 % 42.3 34.4(L) 37.6  Platelets 145 - 400 10e3/uL 308 327 307    CMP Latest Ref Rng 04/15/2014 04/05/2013 04/04/2013  Glucose 70 - 140 mg/dl 187(H) 299(H) 339(H)  BUN 7.0 - 26.0 mg/dL 15.0 15 12  Creatinine 0.6 - 1.1 mg/dL 0.9 0.52 0.52  Sodium 136 - 145 mEq/L 140 139 135(L)  Potassium 3.5 - 5.1 mEq/L 3.9 4.0 4.4  Chloride 96 - 112 mEq/L - 99 98  CO2 22 - 29 mEq/L '28 27 26  ' Calcium 8.4 - 10.4 mg/dL 9.3 9.2 9.0  Total Protein 6.4 - 8.3 g/dL 7.6 - -  Total Bilirubin 0.20 - 1.20 mg/dL 0.45 - -  Alkaline Phos 40 - 150 U/L 115 - -  AST 5 - 34 U/L 31 - -  ALT 0 - 55 U/L 21 - -     RADIOGRAPHIC STUDIES: I have personally reviewed the radiological images as listed and agreed with the findings in the report. US Abdomen Complete  05/12/2014   CLINICAL DATA:  Leukocytosis.  EXAM: ULTRASOUND ABDOMEN COMPLETE  COMPARISON:  None.  FINDINGS: Gallbladder: No gallstones or  wall thickening visualized. No sonographic Murphy sign noted.  Common bile duct: Diameter: 5 mm  Liver: Dense hepatic echotexture suggesting hepatocellular disease and/or fatty infiltration.  IVC: No abnormality visualized.  Pancreas: Visualized portion unremarkable.  Spleen: Size and appearance within normal limits.  Right Kidney: Length: 11.8 cm. Echogenicity within normal limits. No mass or hydronephrosis visualized.  Left Kidney: Length: 12.0 cm. Echogenicity within normal limits. No mass or hydronephrosis visualized.  Abdominal aorta: No aneurysm visualized.  Other findings: None.  IMPRESSION: 1. Dense hepatic echotexture suggesting hepatocellular disease and/or fatty infiltration. 2. Exam otherwise unremarkable.   Electronically Signed   By: Marcello Moores  Register   On: 05/12/2014 09:25    ASSESSMENT & PLAN:  69 year old Caucasian female, with multiple comorbidities including long-standing rheumatoid arthritis, on low dose prednisone and Orencia, diabetes, hypertension, atrial fibrillation, who has chronic mild leukocytosis with predominant neutrophils.  1. Mild leukocytosis, likely reactive -Her leukocytosis is mild, has been chronic for at least 6 years without significant change, WBC differential neutrophils predominant, no blasts or atypical cells. -This is likely related to her low dose prednisone and long-standing rheumatoid arthritis. Also Arencia is a immunosuppressant and potentially can cause lymphoma, she does not have significant lymphadenopathy on exam, no B symptoms, no anemia or some cytopenia, they possibility of lymphoma as well. -I reviewed her lab results from last visit, including negative objective mutation and a negative BCR/ABL mutations, negative flow cytometry. No evidence of myeloproliferative neoplasm. -Giving the overall chronic and stable clinical course, I think the likelihood of bone marrow disease is low, I do not feel she needs a bone marrow biopsy at this point. Certainly  if her leukocytosis gets worse or if she develops other blood counts abnormalities, I would recommend a bone marrow biopsy.  2. Left leg swollen -Improved with 1 months of Lovenox injection. She is back to Pradaxa now -I would like to obtain a repeated ultrasound to see if she still has residual  DVT. If yes, I recommend her to go back to Lovenox for additional 3-6 month treatment.  Plan: -repeat low extremity upper to see if there is residual DVT -Return to clinic in 1 months with repeat lab CBC   All questions were answered. The patient knows to call the clinic with any problems, questions or concerns. I spent 15 minutes counseling the patient face to face. The total time spent in the appointment was 20 minutes and more than 50% was on counseling.     Truitt Merle, MD 05/30/2014 8:15 AM

## 2014-05-30 ENCOUNTER — Telehealth: Payer: Self-pay | Admitting: Hematology

## 2014-05-30 ENCOUNTER — Encounter: Payer: Self-pay | Admitting: Hematology

## 2014-05-30 NOTE — Telephone Encounter (Signed)
Left message to confirm appointment for 02/29 & 03/25. Mailed calendar.

## 2014-05-30 NOTE — Telephone Encounter (Signed)
Left message returning call in regards to 02/29 Venous appointment. Gave vascular phone to call and reschedule per voice mail.

## 2014-06-02 ENCOUNTER — Ambulatory Visit (HOSPITAL_COMMUNITY): Payer: Medicare Other

## 2014-06-09 ENCOUNTER — Ambulatory Visit (HOSPITAL_COMMUNITY)
Admission: RE | Admit: 2014-06-09 | Discharge: 2014-06-09 | Disposition: A | Discharge status: Home / Self Care | Payer: Medicare Other | Source: Ambulatory Visit | Attending: Hematology | Admitting: Hematology

## 2014-06-09 ENCOUNTER — Encounter: Payer: Self-pay | Admitting: Hematology

## 2014-06-09 ENCOUNTER — Telehealth: Payer: Self-pay | Admitting: *Deleted

## 2014-06-09 ENCOUNTER — Ambulatory Visit (HOSPITAL_BASED_OUTPATIENT_CLINIC_OR_DEPARTMENT_OTHER): Payer: Medicare Other | Admitting: Hematology

## 2014-06-09 ENCOUNTER — Telehealth: Payer: Self-pay | Admitting: Hematology

## 2014-06-09 ENCOUNTER — Other Ambulatory Visit: Payer: Self-pay | Admitting: *Deleted

## 2014-06-09 DIAGNOSIS — I82402 Acute embolism and thrombosis of unspecified deep veins of left lower extremity: Secondary | ICD-10-CM | POA: Diagnosis not present

## 2014-06-09 DIAGNOSIS — D72829 Elevated white blood cell count, unspecified: Secondary | ICD-10-CM | POA: Diagnosis not present

## 2014-06-09 DIAGNOSIS — M069 Rheumatoid arthritis, unspecified: Secondary | ICD-10-CM | POA: Diagnosis not present

## 2014-06-09 DIAGNOSIS — Z86718 Personal history of other venous thrombosis and embolism: Secondary | ICD-10-CM | POA: Diagnosis not present

## 2014-06-09 DIAGNOSIS — I4891 Unspecified atrial fibrillation: Secondary | ICD-10-CM | POA: Diagnosis not present

## 2014-06-09 DIAGNOSIS — I10 Essential (primary) hypertension: Secondary | ICD-10-CM

## 2014-06-09 DIAGNOSIS — E119 Type 2 diabetes mellitus without complications: Secondary | ICD-10-CM

## 2014-06-09 MED ORDER — ENOXAPARIN SODIUM 80 MG/0.8ML ~~LOC~~ SOLN: 80.0000 mg | Freq: Two times a day (BID) | SUBCUTANEOUS | Status: DC

## 2014-06-09 NOTE — Telephone Encounter (Signed)
Pt confirmed labs/ov per 03/07 POF, gave pt AVS... KJ  °

## 2014-06-09 NOTE — Progress Notes (Signed)
*  Preliminary Results* Bilateral lower extremity venous duplex completed. The right lower extremity is negative for deep vein thrombosis. The left lower extremity is positive for deep vein thrombosis involving the proximal profunda femoral vein. There is no evidence of Baker's cyst bilaterally.  Incidental finding: There is an anechoic area in the left groin with some internal echoes measuring 6.5cm. This is adjacent to the saphenofemoral junction, and in its superior portion appears to have trivial flow; this may be artifact due to the adjacent vasculature. Etiology is unknown.  06/09/2014  Maudry Mayhew, RVT, RDCS, RDMS

## 2014-06-09 NOTE — Progress Notes (Addendum)
[name]  Telephone:(336) [phone] Fax:(336) [name] Note   Patient Care Team: Aletha Halim, PA-C as PCP - General (Family Medicine) 06/09/2014  CHIEF COMPLAINTS:  Follow up Leukocytosis and DVT    HISTORY OF PRESENTING ILLNESS:  [name] 69 y.o. female is referred by her primary care physician Dr. [name] her mild leukocytosis.  She has had mild leukocytosis at least since 2009, with total white count in the range of 10-15 K, was predominantly elevated neutrophils. She has normal hemoglobin and a platelet count. She denies significant episodes of infection in the past few years, She denied any bleeding episodes including hematochezia, melana, hemoptysis, hematuria or epitaxis. No mucosal bleeding or easy bruising.   She has long-standing history of rheumatoid arthritis, which requires multiple treatments including weekly Oracea injection and low dose prednisone at 5 mg daily. She also has diabetes, hypertension, atrial fibrillation, which are managed by medications.  She feels well overall, no pain, occasional dizziness, she lives alone and is totally independent.  Last colonoscopy was 3-4 years ago, last mammogram was 06/2013.   She also reports her left leg being bigger than right leg for the past onemonth, no significant pain.  INTERIM HISTORY: [name] had lower extremity Doppler done this morning, and was brought back to [place] due to the positive result. She still has mild left leg swelling, no significant pain, no dyspnea or chest pain, no other new complaints.  MEDICAL HISTORY:  Past Medical History  Diagnosis Date  . Chest pain   . Atrial fibrillation   . Anemia     hx of  . Hypertension   . Diabetes mellitus   . Rheumatoid arthritis(714.0)   . Anxiety and depression   . Gastroesophageal reflux disease   . Hyperlipidemia   . Hepatitis 1970  . Jaundice     age 9  . Actinic keratosis   . Atrial fibrillation   . Carpal  tunnel syndrome   . Edema   . Hiatal hernia   . Memory loss   . Thyroid disease     multi nodular goiter    SURGICAL HISTORY: Past Surgical History  Procedure Laterality Date  . Replacement total knee  2009    denies  . Hand surgery  1995 and 1996    artificial joints both hands  . Cataract extraction      left  . Joint replacement Left 2009    HIP  . Total hip arthroplasty Right 04/03/2013    Procedure: RIGHT TOTAL HIP ARTHROPLASTY ANTERIOR APPROACH;  Surgeon: Gearlean Alf, MD;  Location: [facility];  Service: Orthopedics;  Laterality: Right;    SOCIAL HISTORY: History   Social History  . Marital Status: Divorced    Spouse Name: N/A    Number of Children: One son   . Years of Education: N/A   Occupational History  . She used to work in a Engineer, maintenance, retired    Social History Main Topics  . Smoking status: Never Smoker   . Smokeless tobacco: Never Used  . Alcohol Use: No  . Drug Use: No  . Sexual Activity: Not Currently   Other Topics Concern  . Not on file   Social History Narrative  . No narrative on file    FAMILY HISTORY: Family History  Problem Relation Age of Onset  . Heart failure Sister   . Heart attack Neg Hx   . Stroke Father   . Hypertension Sister  No family history of blood disorder and malignancy   ALLERGIES:  has No Known Allergies.  MEDICATIONS:  Current Outpatient Prescriptions  Medication Sig Dispense Refill  . atorvastatin (LIPITOR) 10 MG tablet Take 10 mg by mouth every other day.     [name] buPROPion (WELLBUTRIN SR) 150 MG 12 hr tablet Take 150 mg by mouth 2 (two) times daily.      . Cholecalciferol (VITAMIN D3) 2000 UNITS TABS Take 1 tablet by mouth daily.    . Cyanocobalamin (VITAMIN B 12 PO) Take 2,000 mg by mouth daily.    . digoxin (LANOXIN) 0.125 MG tablet Take 1 tablet (0.125 mg total) by mouth every morning. 90 tablet 3  . DILTIAZEM HCL CD 360 MG 24 hr capsule TAKE 1 CAPSULE BY MOUTH DAILY 90 capsule 2  . enoxaparin  (LOVENOX) 80 MG/0.8ML injection Inject 0.8 mLs (80 mg total) into the skin every 12 (twelve) hours. 60 Syringe 1  . esomeprazole (NEXIUM) 40 MG capsule Take 40 mg by mouth daily.      . furosemide (LASIX) 40 MG tablet TAKE 1 TABLET (40 MG TOTAL) BY MOUTH DAILY. 90 tablet 1  . KLOR-CON 10 10 MEQ tablet TAKE 2 TABLETS BY MOUTH TWICE A DAY 120 tablet 3  . leflunomide (ARAVA) 20 MG tablet Take 20 mg by mouth daily.     . metFORMIN (GLUCOPHAGE) 500 MG tablet Take 1,000 mg by mouth 2 (two) times daily with a meal.    . metoprolol succinate (TOPROL XL) 100 MG 24 hr tablet Take 1 tablet (100 mg total) by mouth 2 (two) times daily. 180 tablet 3  . NITROSTAT 0.4 MG SL tablet PLACE 1 TABLET UNDER TONGUE EVERY 5 MINUTES AS NEEDED (Patient not taking: Reported on 05/29/2014) 25 tablet 0  . Omega 3 1200 MG CAPS Take 1 capsule by mouth 2 (two) times daily.    [name] ORENCIA 125 MG/ML SOSY Inject 125 mg into the skin once a week.     [name] PRADAXA 150 MG CAPS capsule TAKE ONE CAPSULE BY MOUTH TWICE A DAY 180 capsule 1  . predniSONE (DELTASONE) 5 MG tablet Take 5 mg by mouth daily.     No current facility-administered medications for this visit.    REVIEW OF SYSTEMS:   Constitutional: Denies fevers, chills or abnormal night sweats Eyes: Denies blurriness of vision, double vision or watery eyes Ears, nose, mouth, throat, and face: Denies mucositis or sore throat Respiratory: Denies cough, dyspnea or wheezes Cardiovascular: Denies palpitation, chest discomfort or lower extremity swelling Gastrointestinal:  Denies nausea, heartburn or change in bowel habits Skin: Denies abnormal skin rashes Lymphatics: Denies new lymphadenopathy or easy bruising Neurological:Denies numbness, tingling or new weaknesses Behavioral/Psych: Mood is stable, no new changes  All other systems were reviewed with the patient and are negative.  PHYSICAL EXAMINATION: ECOG PERFORMANCE STATUS: 0 - Asymptomatic GENERAL:alert, no distress and  comfortable SKIN: skin color, texture, turgor are normal, no rashes or significant lesions EYES: normal, conjunctiva are pink and non-injected, sclera clear OROPHARYNX:no exudate, no erythema and lips, buccal mucosa, and tongue normal  NECK: supple, thyroid normal size, non-tender, without nodularity LYMPH:  no palpable lymphadenopathy in the cervical, axillary or inguinal LUNGS: clear to auscultation and percussion with normal breathing effort HEART: regular rate & rhythm and no murmurs and no lower extremity edema ABDOMEN:abdomen soft, non-tender and normal bowel sounds Musculoskeletal:no cyanosis of digits and no clubbing  PSYCH: alert & oriented x 3 with fluent speech NEURO: no focal motor/sensory  deficits EXT: trace residual left leg edema up to mid calf   LABORATORY DATA:  I have reviewed the data as listed CBC Latest Ref Rng 04/15/2014 04/05/2013 04/04/2013  WBC 3.9 - 10.3 10e3/uL 10.8(H) 14.7(H) 12.3(H)  Hemoglobin 11.6 - 15.9 g/dL 13.2 11.0(L) 12.0  Hematocrit 34.8 - 46.6 % 42.3 34.4(L) 37.6  Platelets 145 - 400 10e3/uL 308 327 307    CMP Latest Ref Rng 04/15/2014 04/05/2013 04/04/2013  Glucose 70 - 140 mg/dl 187(H) 299(H) 339(H)  BUN 7.0 - 26.0 mg/dL 15.0 15 12  Creatinine 0.6 - 1.1 mg/dL 0.9 0.52 0.52  Sodium 136 - 145 mEq/L 140 139 135(L)  Potassium 3.5 - 5.1 mEq/L 3.9 4.0 4.4  Chloride 96 - 112 mEq/L - 99 98  CO2 22 - 29 mEq/L '28 27 26  ' Calcium 8.4 - 10.4 mg/dL 9.3 9.2 9.0  Total Protein 6.4 - 8.3 g/dL 7.6 - -  Total Bilirubin 0.20 - 1.20 mg/dL 0.45 - -  Alkaline Phos 40 - 150 U/L 115 - -  AST 5 - 34 U/L 31 - -  ALT 0 - 55 U/L 21 - -     RADIOGRAPHIC STUDIES: I have personally reviewed the radiological images as listed and agreed with the findings in the report.  Low extremity venous duplex bilateral 06/09/2014 - Findings consistent with acute deep vein thrombosis involving the left profunda femoris vein. - No evidence of Baker&'s cyst on the right or left. -  Incidental finding: There is an anechoic area in the left groin with some internal echoes measuring 6.5cm. This is adjacent to the saphenofemoral junction, and in its superior portion appears to have trivial flow; this may be artifact due to the adjacent vasculature. Etiology is unknown.  ASSESSMENT & PLAN:  69 year old Caucasian female, with multiple comorbidities including long-standing rheumatoid arthritis, on low dose prednisone and Orencia, diabetes, hypertension, atrial fibrillation, who has chronic mild leukocytosis with predominant neutrophils.  1.Left leg DVT -Improved with 1 months of Lovenox injection. She is back to Pradaxa now, but Doppler showed persistent DVT in left profunda vein. -I recommend her to go back to Lovenox injection 80 mg twice daily for additional 2-5 months. She agrees to do one months, but very hesitate to go beyond that. -If her leg swollen resolves after 1 more months of Lovenox, I would recommend her to switch to Xarelto, instead of Pradaxa. She agrees.  2. Mild leukocytosis, likely reactive -Her leukocytosis is mild, has been chronic for at least 6 years without significant change, WBC differential neutrophils predominant, no blasts or atypical cells. -This is likely related to her low dose prednisone and long-standing rheumatoid arthritis. Also Arencia is a immunosuppressant and potentially can cause lymphoma, she does not have significant lymphadenopathy on exam, no B symptoms, no anemia or some cytopenia, they possibility of lymphoma as well. -I reviewed her lab results from last visit, including negative objective mutation and a negative BCR/ABL mutations, negative flow cytometry. No evidence of myeloproliferative neoplasm. -Giving the overall chronic and stable clinical course, I think the likelihood of bone marrow disease is low, I do not feel she needs a bone marrow biopsy at this point. Certainly if her leukocytosis gets worse or if she  develops other blood counts abnormalities, I would recommend a bone marrow biopsy  Plan: -I called in Lovenox 80 mg twice daily, one-month supply to her pharmacy [facility] today -Return to clinic in 3-4 weeks.  All questions were answered. The patient knows to call the clinic  with any problems, questions or concerns.  I spent 15 minutes counseling the patient face to face. The total time spent in the appointment was 20 minutes and more than 50% was on counseling.     Truitt Merle, MD 06/09/2014 12:20 PM

## 2014-06-09 NOTE — Telephone Encounter (Signed)
Spoke with Radiology department at [facility] stating,"this patient is positive for a DVT in left leg. She is a patient of Dr. Burr Medico. Patient can't leave until MD is aware."  I instructed Radiology to bring the patient to the [place] lobby and wait for further instructions from Dr. Burr Medico. This message will be given to Dr. Burr Medico and her nurse.

## 2014-06-10 ENCOUNTER — Ambulatory Visit: Payer: Medicare Other | Admitting: Hematology

## 2014-06-27 ENCOUNTER — Other Ambulatory Visit: Payer: Medicare Other

## 2014-06-27 ENCOUNTER — Ambulatory Visit: Payer: Medicare Other | Admitting: Hematology

## 2014-07-02 ENCOUNTER — Other Ambulatory Visit: Payer: Self-pay | Admitting: Family Medicine

## 2014-07-02 DIAGNOSIS — M542 Cervicalgia: Secondary | ICD-10-CM

## 2014-07-07 ENCOUNTER — Other Ambulatory Visit: Payer: Self-pay | Admitting: Cardiology

## 2014-07-08 ENCOUNTER — Ambulatory Visit (HOSPITAL_BASED_OUTPATIENT_CLINIC_OR_DEPARTMENT_OTHER): Payer: Medicare Other | Admitting: Hematology

## 2014-07-08 ENCOUNTER — Encounter: Payer: Self-pay | Admitting: Hematology

## 2014-07-08 ENCOUNTER — Telehealth: Payer: Self-pay | Admitting: Hematology

## 2014-07-08 ENCOUNTER — Other Ambulatory Visit (HOSPITAL_BASED_OUTPATIENT_CLINIC_OR_DEPARTMENT_OTHER): Payer: Medicare Other

## 2014-07-08 VITALS — BP 139/83 | HR 84 | Temp 98.2°F | Resp 17 | Ht 67.0 in | Wt 170.2 lb

## 2014-07-08 DIAGNOSIS — I82402 Acute embolism and thrombosis of unspecified deep veins of left lower extremity: Secondary | ICD-10-CM

## 2014-07-08 DIAGNOSIS — E119 Type 2 diabetes mellitus without complications: Secondary | ICD-10-CM | POA: Diagnosis not present

## 2014-07-08 DIAGNOSIS — D509 Iron deficiency anemia, unspecified: Secondary | ICD-10-CM

## 2014-07-08 DIAGNOSIS — I10 Essential (primary) hypertension: Secondary | ICD-10-CM

## 2014-07-08 DIAGNOSIS — D72829 Elevated white blood cell count, unspecified: Secondary | ICD-10-CM

## 2014-07-08 DIAGNOSIS — I4891 Unspecified atrial fibrillation: Secondary | ICD-10-CM | POA: Diagnosis not present

## 2014-07-08 LAB — COMPREHENSIVE METABOLIC PANEL (CC13)
ALBUMIN: 3.4 g/dL — AB (ref 3.5–5.0)
ALK PHOS: 115 U/L (ref 40–150)
ALT: 22 U/L (ref 0–55)
AST: 19 U/L (ref 5–34)
Anion Gap: 13 mEq/L — ABNORMAL HIGH (ref 3–11)
BILIRUBIN TOTAL: 0.47 mg/dL (ref 0.20–1.20)
BUN: 14.1 mg/dL (ref 7.0–26.0)
CO2: 26 mEq/L (ref 22–29)
Calcium: 9.3 mg/dL (ref 8.4–10.4)
Chloride: 102 mEq/L (ref 98–109)
Creatinine: 0.7 mg/dL (ref 0.6–1.1)
EGFR: 84 mL/min/{1.73_m2} — ABNORMAL LOW (ref >90)
Glucose: 263 mg/dl — ABNORMAL HIGH (ref 70–140)
POTASSIUM: 4.1 meq/L (ref 3.5–5.1)
SODIUM: 141 meq/L (ref 136–145)
TOTAL PROTEIN: 7.4 g/dL (ref 6.4–8.3)

## 2014-07-08 LAB — CBC WITH DIFFERENTIAL/PLATELET
BASO%: 0.5 % (ref 0.0–2.0)
BASOS ABS: 0.1 10*3/uL (ref 0.0–0.1)
EOS%: 3 % (ref 0.0–7.0)
Eosinophils Absolute: 0.3 10*3/uL (ref 0.0–0.5)
HCT: 39.7 % (ref 34.8–46.6)
HGB: 12.2 g/dL (ref 11.6–15.9)
LYMPH%: 22 % (ref 14.0–49.7)
MCH: 24.2 pg — ABNORMAL LOW (ref 25.1–34.0)
MCHC: 30.7 g/dL — AB (ref 31.5–36.0)
MCV: 78.8 fL — ABNORMAL LOW (ref 79.5–101.0)
MONO#: 1.1 10*3/uL — AB (ref 0.1–0.9)
MONO%: 9.7 % (ref 0.0–14.0)
NEUT#: 7.1 10*3/uL — ABNORMAL HIGH (ref 1.5–6.5)
NEUT%: 64.8 % (ref 38.4–76.8)
PLATELETS: 308 10*3/uL (ref 145–400)
RBC: 5.04 10*6/uL (ref 3.70–5.45)
RDW: 16 % — ABNORMAL HIGH (ref 11.2–14.5)
WBC: 11 10*3/uL — ABNORMAL HIGH (ref 3.9–10.3)
lymph#: 2.4 10*3/uL (ref 0.9–3.3)

## 2014-07-08 MED ORDER — RIVAROXABAN 20 MG PO TABS: 20.0000 mg | ORAL_TABLET | Freq: Every day | ORAL | Status: DC

## 2014-07-08 NOTE — Progress Notes (Signed)
[place]  Telephone:(336) [phone] Fax:(336) [place] Note   Patient Care Team: Christina Halim, PA-C as PCP - General (Family Medicine) 07/08/2014  CHIEF COMPLAINTS:  Follow up Leukocytosis and DVT    HISTORY OF PRESENTING ILLNESS:  [name] 69 y.o. female is referred by her primary care physician Dr. [name] her mild leukocytosis.  She has had mild leukocytosis at least since 2009, with total white count in the range of 10-15 K, was predominantly elevated neutrophils. She has normal hemoglobin and a platelet count. She denies significant episodes of infection in the past few years, She denied any bleeding episodes including hematochezia, melana, hemoptysis, hematuria or epitaxis. No mucosal bleeding or easy bruising.   She has long-standing history of rheumatoid arthritis, which requires multiple treatments including weekly Oracea injection and low dose prednisone at 5 mg daily. She also has diabetes, hypertension, atrial fibrillation, which are managed by medications.  She feels well overall, no pain, occasional dizziness, she lives alone and is totally independent.  Last colonoscopy was 3-4 years ago, last mammogram was 06/2013.   She also reports her left leg being bigger than right leg for the past onemonth, no significant pain.  INTERIM HISTORY: [name] returns for follow-up. She has been taking Lovenox injection twice daily, she does have bruises at the injection site, no other signs of bleeding. She would like to switch back to oral anticoagulation if possible. She otherwise is doing well, her left leg swollen is improved, but not resolved. No chest pain dyspnea or other new complaints.  MEDICAL HISTORY:  Past Medical History  Diagnosis Date  . Chest pain   . Atrial fibrillation   . Anemia     hx of  . Hypertension   . Diabetes mellitus   . Rheumatoid arthritis(714.0)   . Anxiety and depression   . Gastroesophageal reflux  disease   . Hyperlipidemia   . Hepatitis 1970  . Jaundice     age 6  . Actinic keratosis   . Atrial fibrillation   . Carpal tunnel syndrome   . Edema   . Hiatal hernia   . Memory loss   . Thyroid disease     multi nodular goiter    SURGICAL HISTORY: Past Surgical History  Procedure Laterality Date  . Replacement total knee  2009    denies  . Hand surgery  1995 and 1996    artificial joints both hands  . Cataract extraction      left  . Joint replacement Left 2009    HIP  . Total hip arthroplasty Right 04/03/2013    Procedure: RIGHT TOTAL HIP ARTHROPLASTY ANTERIOR APPROACH;  Surgeon: Gearlean Alf, MD;  Location: [facility];  Service: Orthopedics;  Laterality: Right;    SOCIAL HISTORY: History   Social History  . Marital Status: Divorced    Spouse Name: N/A    Number of Children: One son   . Years of Education: N/A   Occupational History  . She used to work in a Engineer, maintenance, retired    Social History Main Topics  . Smoking status: Never Smoker   . Smokeless tobacco: Never Used  . Alcohol Use: No  . Drug Use: No  . Sexual Activity: Not Currently   Other Topics Concern  . Not on file   Social History Narrative  . No narrative on file    FAMILY HISTORY: Family History  Problem Relation Age of Onset  .  Heart failure Sister   . Heart attack Neg Hx   . Stroke Father   . Hypertension Sister   No family history of blood disorder and malignancy   ALLERGIES:  has No Known Allergies.  MEDICATIONS:  Current Outpatient Prescriptions  Medication Sig Dispense Refill  . buPROPion (WELLBUTRIN SR) 150 MG 12 hr tablet Take 150 mg by mouth 2 (two) times daily.      . Cholecalciferol (VITAMIN D3) 2000 UNITS TABS Take 1 tablet by mouth daily.    . Cyanocobalamin (VITAMIN B 12 PO) Take 2,000 mg by mouth daily.    . digoxin (LANOXIN) 0.125 MG tablet Take 1 tablet (0.125 mg total) by mouth every morning. 90 tablet 3  . DILTIAZEM HCL CD 360 MG 24 hr capsule TAKE 1  CAPSULE BY MOUTH DAILY 90 capsule 2  . doxycycline (VIBRA-TABS) 100 MG tablet Take 100 mg by mouth every 12 (twelve) hours.  0  . enoxaparin (LOVENOX) 80 MG/0.8ML injection Inject 0.8 mLs (80 mg total) into the skin every 12 (twelve) hours. 60 Syringe 1  . esomeprazole (NEXIUM) 40 MG capsule Take 40 mg by mouth daily.      . furosemide (LASIX) 40 MG tablet TAKE 1 TABLET (40 MG TOTAL) BY MOUTH DAILY. 90 tablet 1  . KLOR-CON 10 10 MEQ tablet TAKE 2 TABLETS BY MOUTH TWICE A DAY 120 tablet 3  . leflunomide (ARAVA) 20 MG tablet Take 20 mg by mouth daily.     [name] levofloxacin (LEVAQUIN) 500 MG tablet     . metFORMIN (GLUCOPHAGE) 500 MG tablet Take 1,000 mg by mouth 2 (two) times daily with a meal.    . metoprolol succinate (TOPROL XL) 100 MG 24 hr tablet Take 1 tablet (100 mg total) by mouth 2 (two) times daily. 180 tablet 3  . NITROSTAT 0.4 MG SL tablet PLACE 1 TABLET UNDER TONGUE EVERY 5 MINUTES AS NEEDED 25 tablet 0  . Omega 3 1200 MG CAPS Take 1 capsule by mouth 2 (two) times daily.    [name] ORENCIA 125 MG/ML SOSY Inject 125 mg into the skin once a week.     . predniSONE (DELTASONE) 5 MG tablet Take 5 mg by mouth daily.     No current facility-administered medications for this visit.    REVIEW OF SYSTEMS:   Constitutional: Denies fevers, chills or abnormal night sweats Eyes: Denies blurriness of vision, double vision or watery eyes Ears, nose, mouth, throat, and face: Denies mucositis or sore throat Respiratory: Denies cough, dyspnea or wheezes Cardiovascular: Denies palpitation, chest discomfort or lower extremity swelling Gastrointestinal:  Denies nausea, heartburn or change in bowel habits Skin: Denies abnormal skin rashes Lymphatics: Denies new lymphadenopathy or easy bruising Neurological:Denies numbness, tingling or new weaknesses Behavioral/Psych: Mood is stable, no new changes  All other systems were reviewed with the patient and are negative.  PHYSICAL EXAMINATION: ECOG PERFORMANCE  STATUS: 0 - Asymptomatic GENERAL:alert, no distress and comfortable SKIN: skin color, texture, turgor are normal, no rashes or significant lesions EYES: normal, conjunctiva are pink and non-injected, sclera clear OROPHARYNX:no exudate, no erythema and lips, buccal mucosa, and tongue normal  NECK: supple, thyroid normal size, non-tender, without nodularity LYMPH:  no palpable lymphadenopathy in the cervical, axillary or inguinal LUNGS: clear to auscultation and percussion with normal breathing effort HEART: regular rate & rhythm and no murmurs and no lower extremity edema ABDOMEN:abdomen soft, non-tender and normal bowel sounds Musculoskeletal:no cyanosis of digits and no clubbing  PSYCH: alert & oriented x  3 with fluent speech NEURO: no focal motor/sensory deficits EXT: trace residual left leg edema up to mid calf, left leg is overall bigger than right one.  LABORATORY DATA:  I have reviewed the data as listed CBC Latest Ref Rng 07/08/2014 04/15/2014 04/05/2013  WBC 3.9 - 10.3 10e3/uL 11.0(H) 10.8(H) 14.7(H)  Hemoglobin 11.6 - 15.9 g/dL 12.2 13.2 11.0(L)  Hematocrit 34.8 - 46.6 % 39.7 42.3 34.4(L)  Platelets 145 - 400 10e3/uL 308 308 327    CMP Latest Ref Rng 04/15/2014 04/05/2013 04/04/2013  Glucose 70 - 140 mg/dl 187(H) 299(H) 339(H)  BUN 7.0 - 26.0 mg/dL 15.0 15 12  Creatinine 0.6 - 1.1 mg/dL 0.9 0.52 0.52  Sodium 136 - 145 mEq/L 140 139 135(L)  Potassium 3.5 - 5.1 mEq/L 3.9 4.0 4.4  Chloride 96 - 112 mEq/L - 99 98  CO2 22 - 29 mEq/L '28 27 26  ' Calcium 8.4 - 10.4 mg/dL 9.3 9.2 9.0  Total Protein 6.4 - 8.3 g/dL 7.6 - -  Total Bilirubin 0.20 - 1.20 mg/dL 0.45 - -  Alkaline Phos 40 - 150 U/L 115 - -  AST 5 - 34 U/L 31 - -  ALT 0 - 55 U/L 21 - -     RADIOGRAPHIC STUDIES: I have personally reviewed the radiological images as listed and agreed with the findings in the report.  Low extremity venous duplex bilateral 06/09/2014 - Findings consistent with acute deep vein thrombosis involving  the left profunda femoris vein. - No evidence of Baker&'s cyst on the right or left. - Incidental finding: There is an anechoic area in the left groin with some internal echoes measuring 6.5cm. This is adjacent to the saphenofemoral junction, and in its superior portion appears to have trivial flow; this may be artifact due to the adjacent vasculature. Etiology is unknown.  ASSESSMENT & PLAN:  69 year old Caucasian female, with multiple comorbidities including long-standing rheumatoid arthritis, on low dose prednisone and Orencia, diabetes, hypertension, atrial fibrillation, who has chronic mild leukocytosis with predominant neutrophils.  1.Left leg DVT -Improved with 2 months of Lovenox injection. -I recommend anticoagulation indefinitely. She agrees. -She would like to switch back to oral anticoagulation, I recommend switch her to Xarelto, potential side effects especially bleeding we explained to her she voiced good understanding and agrees to proceed. -I'll discuss with her cardiologist to see if he agrees with the Xarelto. -I sent a prescription of Xarelto 20 mg once daily to her pharmacy today. She will start after her last dose of Lovenox injection in 2 days.   2. Mild leukocytosis, likely reactive -Her leukocytosis is mild, has been chronic for at least 6 years without significant change, WBC differential neutrophils predominant, no blasts or atypical cells. -This is likely related to her low dose prednisone and long-standing rheumatoid arthritis. Also Arencia is a immunosuppressant and potentially can cause lymphoma, she does not have significant lymphadenopathy on exam, no B symptoms, no anemia or some cytopenia, they possibility of lymphoma as well. -I reviewed her lab results from last visit, including negative objective mutation and a negative BCR/ABL mutations, negative flow cytometry. No evidence of myeloproliferative neoplasm. -Giving the overall chronic and stable  clinical course, I think the likelihood of bone marrow disease is low, I do not feel she needs a bone marrow biopsy at this point. Certainly if her leukocytosis gets worse or if she develops other blood counts abnormalities, I would recommend a bone marrow biopsy  3. Microcytic anemia -Her MCV has been low lately.  Her hemoglobin today is 12.2. -I'll check serum iron level and ferritin on next visit.  Plan: -Switch Lovenox to Xarelto 20 mg once daily -Return to clinic in 3 months for follow-up.  All questions were answered. The patient knows to call the clinic with any problems, questions or concerns.  I spent 15 minutes counseling the patient face to face. The total time spent in the appointment was 20 minutes and more than 50% was on counseling.     Truitt Merle, MD 07/08/2014 10:30 AM

## 2014-07-08 NOTE — Telephone Encounter (Signed)
Gave avs & calendar for July °

## 2014-07-10 ENCOUNTER — Telehealth: Payer: Self-pay | Admitting: *Deleted

## 2014-07-10 NOTE — Telephone Encounter (Signed)
Received response back from pt's cardiologist Dr. Mare Ferrari re:  Yes, Xarelto will be fine.  Dr. Burr Medico made aware.  Called pt at home and informed pt of same info.  Pt voiced understanding.

## 2014-07-15 ENCOUNTER — Ambulatory Visit
Admission: RE | Admit: 2014-07-15 | Discharge: 2014-07-15 | Disposition: A | Discharge status: Home / Self Care | Payer: Medicare Other | Source: Ambulatory Visit | Attending: Family Medicine | Admitting: Family Medicine

## 2014-07-15 ENCOUNTER — Other Ambulatory Visit: Payer: Self-pay | Admitting: Family Medicine

## 2014-07-15 DIAGNOSIS — M542 Cervicalgia: Secondary | ICD-10-CM

## 2014-08-11 ENCOUNTER — Other Ambulatory Visit: Payer: Self-pay | Admitting: Cardiology

## 2014-09-02 ENCOUNTER — Other Ambulatory Visit: Payer: Self-pay | Admitting: Cardiology

## 2014-09-08 ENCOUNTER — Encounter (INDEPENDENT_AMBULATORY_CARE_PROVIDER_SITE_OTHER): Payer: Medicare Other | Admitting: Ophthalmology

## 2014-09-08 DIAGNOSIS — H35033 Hypertensive retinopathy, bilateral: Secondary | ICD-10-CM

## 2014-09-08 DIAGNOSIS — E11319 Type 2 diabetes mellitus with unspecified diabetic retinopathy without macular edema: Secondary | ICD-10-CM | POA: Diagnosis not present

## 2014-09-08 DIAGNOSIS — I10 Essential (primary) hypertension: Secondary | ICD-10-CM | POA: Diagnosis not present

## 2014-09-08 DIAGNOSIS — E11329 Type 2 diabetes mellitus with mild nonproliferative diabetic retinopathy without macular edema: Secondary | ICD-10-CM

## 2014-09-08 DIAGNOSIS — H43813 Vitreous degeneration, bilateral: Secondary | ICD-10-CM

## 2014-09-08 DIAGNOSIS — H3531 Nonexudative age-related macular degeneration: Secondary | ICD-10-CM | POA: Diagnosis not present

## 2014-09-17 ENCOUNTER — Other Ambulatory Visit: Payer: Self-pay | Admitting: Cardiology

## 2014-09-22 ENCOUNTER — Encounter: Payer: Self-pay | Admitting: Cardiology

## 2014-09-22 ENCOUNTER — Ambulatory Visit (INDEPENDENT_AMBULATORY_CARE_PROVIDER_SITE_OTHER): Payer: Medicare Other | Admitting: Cardiology

## 2014-09-22 VITALS — BP 130/78 | HR 78 | Ht 67.0 in | Wt 167.1 lb

## 2014-09-22 DIAGNOSIS — E118 Type 2 diabetes mellitus with unspecified complications: Secondary | ICD-10-CM | POA: Diagnosis not present

## 2014-09-22 DIAGNOSIS — I82402 Acute embolism and thrombosis of unspecified deep veins of left lower extremity: Secondary | ICD-10-CM | POA: Diagnosis not present

## 2014-09-22 DIAGNOSIS — I119 Hypertensive heart disease without heart failure: Secondary | ICD-10-CM | POA: Diagnosis not present

## 2014-09-22 DIAGNOSIS — I482 Chronic atrial fibrillation, unspecified: Secondary | ICD-10-CM

## 2014-09-22 NOTE — Progress Notes (Signed)
Cardiology Office Note   Date:  09/22/2014   ID:  [name], DOB 06-Nov-1945, MRN [id number]  PCP:  Tula Nakayama  Cardiologist: Darlin Coco MD  No chief complaint on file.    History of Present Illness: [name] is a 69 y.o. female who presents for scheduled four-month follow-up office visit  This 69 year old woman has HTN, DM, obesity, HLD and atrial fib. She is on chronic anticoagulation. She is on Xarelto 20 mg daily. Last echo was in January of 2013 and showed a normal EF. Digoxin was added back in the summer due to increased heart rate. We switched her diuretic to Lasix. She has had a good response to Lasix.  Not short of breath. Her swelling has resolved. She underwent successful right hip surgery April 03 2013 Since last visit she has not been experiencing any new cardiac symptoms. She has significant rheumatoid arthritis and is on 3 medications for arthritis.  Dr. Ouida Sills is her rheumatologist. She is diabetic. She is not having any hypoglycemic episodes. He has occasional edema in her ankles at the end of the day which goes down overnight.  Past Medical History  Diagnosis Date  . Chest pain   . Atrial fibrillation   . Anemia     hx of  . Hypertension   . Diabetes mellitus   . Rheumatoid arthritis(714.0)   . Anxiety and depression   . Gastroesophageal reflux disease   . Hyperlipidemia   . Hepatitis 1970  . Jaundice     age 65  . Actinic keratosis   . Atrial fibrillation   . Carpal tunnel syndrome   . Edema   . Hiatal hernia   . Memory loss   . Thyroid disease     multi nodular goiter    Past Surgical History  Procedure Laterality Date  . Replacement total knee  2009    denies  . Hand surgery  1995 and 1996    artificial joints both hands  . Cataract extraction      left  . Joint replacement Left 2009    HIP  . Total hip arthroplasty Right 04/03/2013    Procedure: RIGHT TOTAL HIP ARTHROPLASTY ANTERIOR APPROACH;   Surgeon: Gearlean Alf, MD;  Location: [facility];  Service: Orthopedics;  Laterality: Right;     Current Outpatient Prescriptions  Medication Sig Dispense Refill  . buPROPion (WELLBUTRIN SR) 150 MG 12 hr tablet Take 150 mg by mouth 2 (two) times daily.      . celecoxib (CELEBREX) 100 MG capsule Take 100 mg by mouth 2 (two) times daily.    . Cholecalciferol (VITAMIN D3) 2000 UNITS TABS Take 1 tablet by mouth daily.    . Cyanocobalamin (VITAMIN B 12 PO) Take 2,000 mg by mouth daily.    . digoxin (LANOXIN) 0.125 MG tablet TAKE 1 TABLET (0.125 MG TOTAL) BY MOUTH EVERY MORNING. 90 tablet 3  . diltiazem (CARDIZEM CD) 360 MG 24 hr capsule TAKE 1 CAPSULE BY MOUTH DAILY 90 capsule 1  . esomeprazole (NEXIUM) 40 MG capsule Take 40 mg by mouth daily.      . furosemide (LASIX) 40 MG tablet TAKE 1 TABLET (40 MG TOTAL) BY MOUTH DAILY. 90 tablet 0  . KLOR-CON 10 10 MEQ tablet TAKE 2 TABLETS BY MOUTH TWICE A DAY 120 tablet 3  . leflunomide (ARAVA) 20 MG tablet Take 20 mg by mouth daily.     . metFORMIN (GLUCOPHAGE) 500 MG tablet Take 1,000 mg  by mouth 2 (two) times daily with a meal.    . metoprolol succinate (TOPROL XL) 100 MG 24 hr tablet Take 1 tablet (100 mg total) by mouth 2 (two) times daily. 180 tablet 3  . nitroGLYCERIN (NITROSTAT) 0.4 MG SL tablet Place 0.4 mg under the tongue every 5 (five) minutes as needed for chest pain (chest pain).    . Omega 3 1200 MG CAPS Take 1 capsule by mouth 2 (two) times daily.    [name] ORENCIA 125 MG/ML SOSY Inject 125 mg into the skin once a week.     . predniSONE (DELTASONE) 5 MG tablet Take 5 mg by mouth daily.    . rivaroxaban (XARELTO) 20 MG TABS tablet Take 1 tablet (20 mg total) by mouth daily with supper. 30 tablet 3   No current facility-administered medications for this visit.    Allergies:   Review of patient's allergies indicates no known allergies.    Social History:  The patient  reports that she has never smoked. She has never used smokeless tobacco. She  reports that she does not drink alcohol or use illicit drugs.   Family History:  The patient's family history includes Heart failure in her sister; Hypertension in her sister; Stroke in her father. There is no history of Heart attack.    ROS:  Please see the history of present illness.   Otherwise, review of systems are positive for none.   All other systems are reviewed and negative.    PHYSICAL EXAM: VS:  BP 130/78 mmHg  Pulse 78  Ht 5\' 7"  (1.702 m)  Wt 167 lb 1.9 oz (75.805 kg)  BMI 26.17 kg/m2 , BMI Body mass index is 26.17 kg/(m^2). GEN: Well nourished, well developed, in no acute distress HEENT: normal Neck: no JVD, carotid bruits, or masses Cardiac: Irregularly irregular.  Grade 2/6 systolic ejection murmur at base. Respiratory:  clear to auscultation bilaterally, normal work of breathing GI: soft, nontender, nondistended, + BS MS: no deformity or atrophy Skin: warm and dry, no rash Neuro:  Strength and sensation are intact Psych: euthymic mood, full affect   EKG:  EKG is ordered today. The ekg ordered today demonstrates atrial fibrillation with controlled ventricular response.  Nonspecific ST-T wave abnormalities.   Recent Labs: 07/08/2014: ALT 22; BUN 14.1; Creatinine 0.7; HGB 12.2; Platelets 308; Potassium 4.1; Sodium 141    Lipid Panel    Component Value Date/Time   CHOL 195 09/06/2012 0954   TRIG 309.0* 09/06/2012 0954   HDL 30.70* 09/06/2012 0954   CHOLHDL 6 09/06/2012 0954   VLDL 61.8* 09/06/2012 0954   LDLDIRECT 117.3 09/06/2012 0954      Wt Readings from Last 3 Encounters:  09/22/14 167 lb 1.9 oz (75.805 kg)  07/08/14 170 lb 3.2 oz (77.202 kg)  05/29/14 167 lb 3.2 oz (75.841 kg)       ASSESSMENT AND PLAN:  1. permanent atrial fibrillation 2. Hypertensive heart disease without heart failure 3. diabetes mellitus type 2 4. rheumatoid arthritis 5. Osteoarthritis    Current medicines are reviewed at length with the patient today.  The patient  does not have concerns regarding medicines.  The following changes have been made:  no change  Labs/ tests ordered today include:   Orders Placed This Encounter  Procedures  . EKG 12-Lead      Signed, Darlin Coco MD 09/22/2014 10:47 AM    [facility] [address], [place], [place]  [address] Phone: [phone];  Fax: [phone]

## 2014-09-22 NOTE — Patient Instructions (Signed)
Medication Instructions:  Your physician recommends that you continue on your current medications as directed. Please refer to the Current Medication list given to you today.  Labwork: none  Testing/Procedures: None  Follow-Up: Your physician wants you to follow-up in: 4 month ov You will receive a reminder letter in the mail two months in advance. If you don't receive a letter, please call our office to schedule the follow-up appointment.

## 2014-09-27 ENCOUNTER — Other Ambulatory Visit: Payer: Self-pay | Admitting: Cardiology

## 2014-10-08 ENCOUNTER — Encounter (HOSPITAL_COMMUNITY): Payer: Self-pay | Admitting: Neurology

## 2014-10-08 ENCOUNTER — Emergency Department (HOSPITAL_COMMUNITY): Payer: Medicare Other

## 2014-10-08 ENCOUNTER — Ambulatory Visit (HOSPITAL_COMMUNITY): Payer: Medicare Other | Admitting: Nurse Practitioner

## 2014-10-08 ENCOUNTER — Inpatient Hospital Stay (HOSPITAL_COMMUNITY)
Admission: EM | Admit: 2014-10-08 | Discharge: 2014-10-10 | DRG: 310 | Disposition: A | Discharge status: Home / Self Care | Payer: Medicare Other | Attending: Internal Medicine | Admitting: Internal Medicine

## 2014-10-08 DIAGNOSIS — Z86718 Personal history of other venous thrombosis and embolism: Secondary | ICD-10-CM

## 2014-10-08 DIAGNOSIS — K219 Gastro-esophageal reflux disease without esophagitis: Secondary | ICD-10-CM | POA: Diagnosis present

## 2014-10-08 DIAGNOSIS — I481 Persistent atrial fibrillation: Principal | ICD-10-CM | POA: Diagnosis present

## 2014-10-08 DIAGNOSIS — F419 Anxiety disorder, unspecified: Secondary | ICD-10-CM | POA: Diagnosis present

## 2014-10-08 DIAGNOSIS — D469 Myelodysplastic syndrome, unspecified: Secondary | ICD-10-CM | POA: Diagnosis present

## 2014-10-08 DIAGNOSIS — Z7952 Long term (current) use of systemic steroids: Secondary | ICD-10-CM

## 2014-10-08 DIAGNOSIS — Z8249 Family history of ischemic heart disease and other diseases of the circulatory system: Secondary | ICD-10-CM | POA: Diagnosis not present

## 2014-10-08 DIAGNOSIS — M069 Rheumatoid arthritis, unspecified: Secondary | ICD-10-CM | POA: Diagnosis present

## 2014-10-08 DIAGNOSIS — D509 Iron deficiency anemia, unspecified: Secondary | ICD-10-CM | POA: Diagnosis present

## 2014-10-08 DIAGNOSIS — E7800 Pure hypercholesterolemia, unspecified: Secondary | ICD-10-CM | POA: Diagnosis present

## 2014-10-08 DIAGNOSIS — I4891 Unspecified atrial fibrillation: Secondary | ICD-10-CM

## 2014-10-08 DIAGNOSIS — Z79899 Other long term (current) drug therapy: Secondary | ICD-10-CM | POA: Diagnosis not present

## 2014-10-08 DIAGNOSIS — E119 Type 2 diabetes mellitus without complications: Secondary | ICD-10-CM

## 2014-10-08 DIAGNOSIS — M1610 Unilateral primary osteoarthritis, unspecified hip: Secondary | ICD-10-CM | POA: Diagnosis present

## 2014-10-08 DIAGNOSIS — R0789 Other chest pain: Secondary | ICD-10-CM

## 2014-10-08 DIAGNOSIS — I4821 Permanent atrial fibrillation: Secondary | ICD-10-CM | POA: Diagnosis present

## 2014-10-08 DIAGNOSIS — R05 Cough: Secondary | ICD-10-CM | POA: Diagnosis present

## 2014-10-08 DIAGNOSIS — Z823 Family history of stroke: Secondary | ICD-10-CM

## 2014-10-08 DIAGNOSIS — Z7902 Long term (current) use of antithrombotics/antiplatelets: Secondary | ICD-10-CM | POA: Diagnosis not present

## 2014-10-08 DIAGNOSIS — E785 Hyperlipidemia, unspecified: Secondary | ICD-10-CM | POA: Diagnosis present

## 2014-10-08 DIAGNOSIS — I10 Essential (primary) hypertension: Secondary | ICD-10-CM | POA: Diagnosis present

## 2014-10-08 DIAGNOSIS — Z96643 Presence of artificial hip joint, bilateral: Secondary | ICD-10-CM | POA: Diagnosis present

## 2014-10-08 DIAGNOSIS — M169 Osteoarthritis of hip, unspecified: Secondary | ICD-10-CM | POA: Diagnosis present

## 2014-10-08 DIAGNOSIS — F329 Major depressive disorder, single episode, unspecified: Secondary | ICD-10-CM | POA: Diagnosis present

## 2014-10-08 DIAGNOSIS — E1169 Type 2 diabetes mellitus with other specified complication: Secondary | ICD-10-CM

## 2014-10-08 DIAGNOSIS — R059 Cough, unspecified: Secondary | ICD-10-CM

## 2014-10-08 DIAGNOSIS — E11319 Type 2 diabetes mellitus with unspecified diabetic retinopathy without macular edema: Secondary | ICD-10-CM | POA: Diagnosis not present

## 2014-10-08 HISTORY — DX: Type 2 diabetes mellitus without complications: E11.9

## 2014-10-08 HISTORY — DX: Anxiety disorder, unspecified: F41.9

## 2014-10-08 LAB — BASIC METABOLIC PANEL
ANION GAP: 12 (ref 5–15)
BUN: 8 mg/dL (ref 6–20)
CALCIUM: 8.4 mg/dL — AB (ref 8.9–10.3)
CO2: 26 mmol/L (ref 22–32)
CREATININE: 0.57 mg/dL (ref 0.44–1.00)
Chloride: 99 mmol/L — ABNORMAL LOW (ref 101–111)
GFR calc non Af Amer: >60 mL/min (ref >60)
Glucose, Bld: 185 mg/dL — ABNORMAL HIGH (ref 65–99)
Potassium: 3.6 mmol/L (ref 3.5–5.1)
Sodium: 137 mmol/L (ref 135–145)

## 2014-10-08 LAB — APTT: aPTT: 32 seconds (ref 24–37)

## 2014-10-08 LAB — TROPONIN I
Troponin I: 0.04 ng/mL — ABNORMAL HIGH
Troponin I: <0.03 ng/mL

## 2014-10-08 LAB — CBC WITH DIFFERENTIAL/PLATELET
Basophils Absolute: 0 10*3/uL (ref 0.0–0.1)
Basophils Relative: 0 % (ref 0–1)
EOS PCT: 1 % (ref 0–5)
Eosinophils Absolute: 0.2 10*3/uL (ref 0.0–0.7)
HEMATOCRIT: 34.1 % — AB (ref 36.0–46.0)
Hemoglobin: 10.3 g/dL — ABNORMAL LOW (ref 12.0–15.0)
LYMPHS ABS: 3.1 10*3/uL (ref 0.7–4.0)
Lymphocytes Relative: 20 % (ref 12–46)
MCH: 22.2 pg — ABNORMAL LOW (ref 26.0–34.0)
MCHC: 30.2 g/dL (ref 30.0–36.0)
MCV: 73.7 fL — AB (ref 78.0–100.0)
Monocytes Absolute: 1.6 10*3/uL — ABNORMAL HIGH (ref 0.1–1.0)
Monocytes Relative: 10 % (ref 3–12)
Neutro Abs: 10.8 10*3/uL — ABNORMAL HIGH (ref 1.7–7.7)
Neutrophils Relative %: 69 % (ref 43–77)
Platelets: 248 10*3/uL (ref 150–400)
RBC: 4.63 MIL/uL (ref 3.87–5.11)
RDW: 17.3 % — ABNORMAL HIGH (ref 11.5–15.5)
WBC: 15.7 10*3/uL — ABNORMAL HIGH (ref 4.0–10.5)

## 2014-10-08 LAB — DIGOXIN LEVEL: Digoxin Level: 0.3 ng/mL — ABNORMAL LOW (ref 0.8–2.0)

## 2014-10-08 LAB — PROTIME-INR
INR: 1.41 (ref 0.00–1.49)
PROTHROMBIN TIME: 17.4 s — AB (ref 11.6–15.2)

## 2014-10-08 LAB — GLUCOSE, CAPILLARY: Glucose-Capillary: 309 mg/dL — ABNORMAL HIGH (ref 65–99)

## 2014-10-08 LAB — BRAIN NATRIURETIC PEPTIDE: B Natriuretic Peptide: 226.8 pg/mL — ABNORMAL HIGH (ref 0.0–100.0)

## 2014-10-08 MED ORDER — DILTIAZEM LOAD VIA INFUSION
10.0000 mg | Freq: Once | INTRAVENOUS | Status: AC
  Administered 2014-10-08: 10 mg via INTRAVENOUS
  Filled 2014-10-08: qty 10

## 2014-10-08 MED ORDER — PREDNISONE 10 MG PO TABS
15.0000 mg | ORAL_TABLET | Freq: Two times a day (BID) | ORAL | Status: DC
  Administered 2014-10-08 – 2014-10-09 (×2): 15 mg via ORAL
  Filled 2014-10-08 (×4): qty 1

## 2014-10-08 MED ORDER — VITAMIN B-12 1000 MCG PO TABS
2000.0000 ug | ORAL_TABLET | Freq: Every day | ORAL | Status: DC
  Administered 2014-10-09 – 2014-10-10 (×2): 2000 ug via ORAL
  Filled 2014-10-08 (×2): qty 2

## 2014-10-08 MED ORDER — VITAMIN D 1000 UNITS PO TABS
2000.0000 [IU] | ORAL_TABLET | Freq: Every day | ORAL | Status: DC
  Administered 2014-10-09 – 2014-10-10 (×2): 2000 [IU] via ORAL
  Filled 2014-10-08 (×2): qty 2

## 2014-10-08 MED ORDER — ACETAMINOPHEN 325 MG PO TABS
650.0000 mg | ORAL_TABLET | ORAL | Status: DC | PRN
  Administered 2014-10-08: 650 mg via ORAL
  Filled 2014-10-08: qty 2

## 2014-10-08 MED ORDER — DEXTROSE 5 % IV SOLN
5.0000 mg/h | INTRAVENOUS | Status: DC
  Administered 2014-10-08: 10 mg/h via INTRAVENOUS

## 2014-10-08 MED ORDER — SODIUM CHLORIDE 0.9 % IV SOLN: 250.0000 mL | INTRAVENOUS | Status: DC | PRN

## 2014-10-08 MED ORDER — BUPROPION HCL ER (SR) 150 MG PO TB12
150.0000 mg | ORAL_TABLET | Freq: Two times a day (BID) | ORAL | Status: DC
  Administered 2014-10-08 – 2014-10-10 (×4): 150 mg via ORAL
  Filled 2014-10-08 (×4): qty 1

## 2014-10-08 MED ORDER — RIVAROXABAN 20 MG PO TABS
20.0000 mg | ORAL_TABLET | Freq: Every day | ORAL | Status: DC
  Administered 2014-10-09: 20 mg via ORAL
  Filled 2014-10-08: qty 1

## 2014-10-08 MED ORDER — DIGOXIN 125 MCG PO TABS
0.1250 mg | ORAL_TABLET | Freq: Every day | ORAL | Status: DC
  Administered 2014-10-09 – 2014-10-10 (×2): 0.125 mg via ORAL
  Filled 2014-10-08 (×2): qty 1

## 2014-10-08 MED ORDER — FUROSEMIDE 40 MG PO TABS
40.0000 mg | ORAL_TABLET | Freq: Every day | ORAL | Status: DC
  Administered 2014-10-09 – 2014-10-10 (×2): 40 mg via ORAL
  Filled 2014-10-08 (×2): qty 1

## 2014-10-08 MED ORDER — INSULIN ASPART 100 UNIT/ML ~~LOC~~ SOLN
0.0000 [IU] | Freq: Every day | SUBCUTANEOUS | Status: DC
  Administered 2014-10-08: 4 [IU] via SUBCUTANEOUS

## 2014-10-08 MED ORDER — DEXTROSE 5 % IV SOLN
5.0000 mg/h | INTRAVENOUS | Status: DC
  Administered 2014-10-08 – 2014-10-09 (×3): 10 mg/h via INTRAVENOUS
  Administered 2014-10-09: 15 mg/h via INTRAVENOUS
  Filled 2014-10-08: qty 100

## 2014-10-08 MED ORDER — INSULIN ASPART 100 UNIT/ML ~~LOC~~ SOLN
0.0000 [IU] | Freq: Three times a day (TID) | SUBCUTANEOUS | Status: DC
  Administered 2014-10-09: 9 [IU] via SUBCUTANEOUS
  Administered 2014-10-09 (×2): 7 [IU] via SUBCUTANEOUS
  Administered 2014-10-10: 2 [IU] via SUBCUTANEOUS
  Administered 2014-10-10: 7 [IU] via SUBCUTANEOUS

## 2014-10-08 MED ORDER — OMEGA-3-ACID ETHYL ESTERS 1 G PO CAPS
1.0000 g | ORAL_CAPSULE | Freq: Two times a day (BID) | ORAL | Status: DC
  Administered 2014-10-08 – 2014-10-10 (×4): 1 g via ORAL
  Filled 2014-10-08 (×4): qty 1

## 2014-10-08 MED ORDER — SODIUM CHLORIDE 0.9 % IJ SOLN: 3.0000 mL | INTRAMUSCULAR | Status: DC | PRN

## 2014-10-08 MED ORDER — DILTIAZEM HCL ER COATED BEADS 360 MG PO CP24
360.0000 mg | ORAL_CAPSULE | Freq: Every day | ORAL | Status: DC
  Filled 2014-10-08: qty 1

## 2014-10-08 MED ORDER — LEFLUNOMIDE 20 MG PO TABS
20.0000 mg | ORAL_TABLET | Freq: Every day | ORAL | Status: DC
  Administered 2014-10-09 – 2014-10-10 (×2): 20 mg via ORAL
  Filled 2014-10-08 (×2): qty 1

## 2014-10-08 MED ORDER — CELECOXIB 100 MG PO CAPS
100.0000 mg | ORAL_CAPSULE | Freq: Two times a day (BID) | ORAL | Status: DC
  Administered 2014-10-08 – 2014-10-10 (×3): 100 mg via ORAL
  Filled 2014-10-08 (×5): qty 1

## 2014-10-08 MED ORDER — NITROGLYCERIN 0.4 MG SL SUBL: 0.4000 mg | SUBLINGUAL_TABLET | SUBLINGUAL | Status: DC | PRN

## 2014-10-08 MED ORDER — IOHEXOL 350 MG/ML SOLN
80.0000 mL | Freq: Once | INTRAVENOUS | Status: AC | PRN
  Administered 2014-10-08: 100 mL via INTRAVENOUS

## 2014-10-08 MED ORDER — ABATACEPT 125 MG/ML ~~LOC~~ SOSY
125.0000 mg | PREFILLED_SYRINGE | SUBCUTANEOUS | Status: DC
Start: 2014-10-14 — End: 2014-10-09

## 2014-10-08 MED ORDER — PANTOPRAZOLE SODIUM 40 MG PO TBEC
40.0000 mg | DELAYED_RELEASE_TABLET | Freq: Every day | ORAL | Status: DC
  Administered 2014-10-09 – 2014-10-10 (×2): 40 mg via ORAL
  Filled 2014-10-08 (×2): qty 1

## 2014-10-08 MED ORDER — RIVAROXABAN 20 MG PO TABS: 20.0000 mg | ORAL_TABLET | Freq: Every day | ORAL | Status: DC

## 2014-10-08 MED ORDER — SODIUM CHLORIDE 0.9 % IJ SOLN
3.0000 mL | Freq: Two times a day (BID) | INTRAMUSCULAR | Status: DC
  Administered 2014-10-09: 3 mL via INTRAVENOUS

## 2014-10-08 MED ORDER — ONDANSETRON HCL 4 MG/2ML IJ SOLN: 4.0000 mg | Freq: Four times a day (QID) | INTRAMUSCULAR | Status: DC | PRN

## 2014-10-08 MED ORDER — POTASSIUM CHLORIDE ER 10 MEQ PO TBCR
20.0000 meq | EXTENDED_RELEASE_TABLET | Freq: Two times a day (BID) | ORAL | Status: DC
  Administered 2014-10-08 – 2014-10-10 (×4): 20 meq via ORAL
  Filled 2014-10-08 (×8): qty 2

## 2014-10-08 NOTE — ED Notes (Signed)
Patient transported to CT 

## 2014-10-08 NOTE — ED Notes (Signed)
Patient transported to X-ray 

## 2014-10-08 NOTE — ED Notes (Signed)
Per ems- pt comes for [facility] in [place] c/o tightness in chest and pain between shoulder blades since last night. Pain comes when taking a deep breath; has hx of AFIB today rate is 160. Given 10 mg cardizem, 324 aspirin, 1 nitro. Rate 126, cp relieved pain still in back between shoulder blades. BP 143/70, HR 126, CBG 269

## 2014-10-08 NOTE — ED Provider Notes (Signed)
CSN: [id number]     Arrival date & time 10/08/14  1508 History   First MD Initiated Contact with Patient 10/08/14 1513     Chief Complaint  Patient presents with  . Atrial Fibrillation     (Consider location/radiation/quality/duration/timing/severity/associated sxs/prior Treatment) HPI Comments: 69 year old woman with a history of atrial fibrillation on metoprolol, diltiazem and digoxin currently presents with chest pain and cough. She reports a 2 day history of nonproductive cough that is worse when she is supine. The chest pain is epigastric and radiates backwards to her thoracic spine. She reports that the pain is sharp and intermittent, worsening when she coughs or takes a deep inspiration. She denies nausea, vomiting, fevers, diaphoresis, headaches, lightheadedness, and vision changes. Pt also has hx of DVT despite being on pradaxa, and was switched to xarelto after the DVT. Pt also finished 2 months of lovenox after the DVT was discovered.   ROS 10 Systems reviewed and are negative for acute change except as noted in the HPI.     Patient is a 69 y.o. female presenting with atrial fibrillation. The history is provided by the patient.  Atrial Fibrillation Associated symptoms include chest pain and shortness of breath.    Past Medical History  Diagnosis Date  . Chest pain   . Atrial fibrillation   . Anemia     hx of  . Hypertension   . Diabetes mellitus   . Rheumatoid arthritis(714.0)   . Anxiety and depression   . Gastroesophageal reflux disease   . Hyperlipidemia   . Hepatitis 1970  . Jaundice     age 55  . Actinic keratosis   . Atrial fibrillation   . Carpal tunnel syndrome   . Edema   . Hiatal hernia   . Memory loss   . Thyroid disease     multi nodular goiter   Past Surgical History  Procedure Laterality Date  . Replacement total knee  2009    denies  . Hand surgery  1995 and 1996    artificial joints both hands  . Cataract extraction      left  . Joint  replacement Left 2009    HIP  . Total hip arthroplasty Right 04/03/2013    Procedure: RIGHT TOTAL HIP ARTHROPLASTY ANTERIOR APPROACH;  Surgeon: Gearlean Alf, MD;  Location: [facility];  Service: Orthopedics;  Laterality: Right;   Family History  Problem Relation Age of Onset  . Heart failure Sister   . Heart attack Neg Hx   . Stroke Father   . Hypertension Sister    History  Substance Use Topics  . Smoking status: Never Smoker   . Smokeless tobacco: Never Used  . Alcohol Use: No   OB History    No data available     Review of Systems  Respiratory: Positive for cough and shortness of breath.   Cardiovascular: Positive for chest pain.  All other systems reviewed and are negative.     Allergies  Review of patient's allergies indicates no known allergies.  Home Medications   Prior to Admission medications   Medication Sig Start Date End Date Taking? Authorizing Provider  buPROPion (WELLBUTRIN SR) 150 MG 12 hr tablet Take 150 mg by mouth 2 (two) times daily.      Historical Provider, MD  celecoxib (CELEBREX) 100 MG capsule Take 100 mg by mouth 2 (two) times daily. 09/02/14   Historical Provider, MD  Cholecalciferol (VITAMIN D3) 2000 UNITS TABS Take 1 tablet by  mouth daily.    Historical Provider, MD  Cyanocobalamin (VITAMIN B 12 PO) Take 2,000 mg by mouth daily.    Historical Provider, MD  digoxin (LANOXIN) 0.125 MG tablet TAKE 1 TABLET (0.125 MG TOTAL) BY MOUTH EVERY MORNING. 09/18/14   Darlin Coco, MD  diltiazem (CARDIZEM CD) 360 MG 24 hr capsule TAKE 1 CAPSULE BY MOUTH DAILY 07/08/14   Darlin Coco, MD  esomeprazole (NEXIUM) 40 MG capsule Take 40 mg by mouth daily.      Historical Provider, MD  furosemide (LASIX) 40 MG tablet TAKE 1 TABLET (40 MG TOTAL) BY MOUTH DAILY. 08/12/14   Darlin Coco, MD  KLOR-CON 10 10 MEQ tablet TAKE 2 TABLETS BY MOUTH TWICE A DAY 09/29/14   Darlin Coco, MD  leflunomide (ARAVA) 20 MG tablet Take 20 mg by mouth daily.  04/26/13    Historical Provider, MD  metFORMIN (GLUCOPHAGE) 500 MG tablet Take 1,000 mg by mouth 2 (two) times daily with a meal.    Historical Provider, MD  metoprolol succinate (TOPROL XL) 100 MG 24 hr tablet Take 1 tablet (100 mg total) by mouth 2 (two) times daily. 09/04/12   Darlin Coco, MD  nitroGLYCERIN (NITROSTAT) 0.4 MG SL tablet Place 0.4 mg under the tongue every 5 (five) minutes as needed for chest pain (chest pain).    Historical Provider, MD  Omega 3 1200 MG CAPS Take 1 capsule by mouth 2 (two) times daily.    Historical Provider, MD  ORENCIA 125 MG/ML SOSY Inject 125 mg into the skin once a week.  05/07/13   Historical Provider, MD  predniSONE (DELTASONE) 5 MG tablet Take 5 mg by mouth daily. 04/05/13   Arlee Muslim, PA-C  rivaroxaban (XARELTO) 20 MG TABS tablet Take 1 tablet (20 mg total) by mouth daily with supper. 07/08/14   Truitt Merle, MD   BP 156/79 mmHg  Pulse 137  Temp(Src) 98.3 F (36.8 C) (Oral)  Resp 22  SpO2 96% Physical Exam  Constitutional: She is oriented to person, place, and time. She appears well-developed and well-nourished.  HENT:  Head: Normocephalic and atraumatic.  Eyes: EOM are normal.  Neck: Neck supple.  Cardiovascular: Normal heart sounds.   No murmur heard. Pulmonary/Chest: Effort normal. No respiratory distress. She has rales.  Bibasilar rales   Abdominal: Soft. She exhibits no distension. There is no tenderness.  Musculoskeletal: She exhibits edema.  LLE unilateral calf swelling  Neurological: She is alert and oriented to person, place, and time.  Skin: Skin is warm and dry.  Nursing note and vitals reviewed.   ED Course  Procedures (including critical care time) Labs Review Labs Reviewed  CBC WITH DIFFERENTIAL/PLATELET - Abnormal; Notable for the following:    WBC 15.7 (*)    Hemoglobin 10.3 (*)    HCT 34.1 (*)    MCV 73.7 (*)    MCH 22.2 (*)    RDW 17.3 (*)    Neutro Abs 10.8 (*)    Monocytes Absolute 1.6 (*)    All other components within  normal limits  BASIC METABOLIC PANEL - Abnormal; Notable for the following:    Chloride 99 (*)    Glucose, Bld 185 (*)    Calcium 8.4 (*)    All other components within normal limits  PROTIME-INR - Abnormal; Notable for the following:    Prothrombin Time 17.4 (*)    All other components within normal limits  TROPONIN I - Abnormal; Notable for the following:    Troponin I 0.04 (*)  All other components within normal limits  BRAIN NATRIURETIC PEPTIDE - Abnormal; Notable for the following:    B Natriuretic Peptide 226.8 (*)    All other components within normal limits  APTT    Imaging Review Dg Chest 2 View  10/08/2014   CLINICAL DATA:  Initial encounter for chest pain and tightness since last night. Dry cough since yesterday.  EXAM: CHEST  2 VIEW  COMPARISON:  11/29/2013.  FINDINGS: The lungs are clear without focal infiltrate, edema, pneumothorax or pleural effusion. Interstitial markings are diffusely coarsened with chronic features. Cardiopericardial silhouette is at upper limits of normal for size. Old left fourth rib fracture again noted.  IMPRESSION: Stable.  No acute cardiopulmonary findings.   Electronically Signed   By: Misty Stanley M.D.   On: 10/08/2014 16:53   Ct Angio Chest Pe W/cm &/or Wo Cm  10/08/2014   CLINICAL DATA:  Chest pain radiating into the back and between the shoulder blades beginning 10/07/2014. No known injury. Initial encounter.  EXAM: CT ANGIOGRAPHY CHEST WITH CONTRAST  TECHNIQUE: Multidetector CT imaging of the chest was performed using the standard protocol during bolus administration of intravenous contrast. Multiplanar CT image reconstructions and MIPs were obtained to evaluate the vascular anatomy.  CONTRAST:  100 mL OMNIPAQUE IOHEXOL 350 MG/ML SOLN  COMPARISON:  PA and lateral chest this same day.  FINDINGS: No pulmonary embolus is identified. Bovine type aortic arch is incidentally noted. Scattered calcific aortic and coronary atherosclerosis is noted. Heart  size is upper normal. No pleural or pericardial effusion. No axillary, hilar or mediastinal lymphadenopathy. The lungs show only mild dependent atelectatic change. Visualized upper abdomen is unremarkable. No focal bony abnormality is identified. Mild appearing thoracic spondylosis is noted.  Review of the MIP images confirms the above findings.  IMPRESSION: Negative for pulmonary embolus. No acute abnormality or finding to explain the patient's symptoms.  Scattered aortic and coronary atherosclerosis.   Electronically Signed   By: Inge Rise M.D.   On: 10/08/2014 18:52     EKG Interpretation   Date/Time:  Wednesday October 08 2014 15:20:15 EDT Ventricular Rate:  115 PR Interval:    QRS Duration: 73 QT Interval:  318 QTC Calculation: 440 R Axis:   72 Text Interpretation:  Atrial fibrillation Borderline repolarization  abnormality No significant change since last tracing Confirmed by  Chloe Miyoshi, MD, Thelma Comp [id number]) on 10/08/2014 4:01:56 PM      MDM   Final diagnoses:  Cough  Atrial fibrillation with RVR    Pt comes in with cc of cough, shortness of breath and chest pain. Pt has no palpitations, but is noted to be in afib with RVR - HR in the 120-130s. Pt denies any precipitating factor for her RVR - no infection like sx, no non compliance. Pt has pleuritic type chest pain with hx of DVT and current unilateral leg swelling. She is on xarelto, but she had a DVT despite being on pradaxa - and so with new RVR and pleuritic type chest pain in a patient with high WELLS score we got a CT PE which is neg for acute findings. Labs are reassuring.  We will start pt on dilt drip. Pt is taking full dose diltiazem and metoprolol orally right now, also on digoxin. Will consult Cards to get rate in better control or for them to consider further therapy including ablation. Trop is at 0.04 - all demand related most likely. Repeat trop ordered.   CRITICAL CARE Performed by: Varney Biles  Total  critical care time: 40 minutes  Critical care time was exclusive of separately billable procedures and treating other patients.  Critical care was necessary to treat or prevent imminent or life-threatening deterioration.  Critical care was time spent personally by me on the following activities: development of treatment plan with patient and/or surrogate as well as nursing, discussions with consultants, evaluation of patient's response to treatment, examination of patient, obtaining history from patient or surrogate, ordering and performing treatments and interventions, ordering and review of laboratory studies, ordering and review of radiographic studies, pulse oximetry and re-evaluation of patient's condition.   Varney Biles, MD 10/09/14 2257

## 2014-10-08 NOTE — Consult Note (Addendum)
Primary Physician:  Rheumatology:  Holland Falling PA)  [facility] [phone] Primary Cardiologist:  Mare Ferrari   HPI: Pt is a 69 yo with a history of HTN, atrial fib, DM, obesity  She is on chronic Xarelto.  Echo in 2013 LVEF was normal.   Digoxin added to regimen last summer for increased rate control She was seen in clinic in June 20th by Vaughan Browner  For the past couple days she has had cough, worse with lying down.  Also complained of CP that is epigastric radieating to spine  Worse with cough and breathing Pt denies F/C  Some SOB  No N/V  No one sick at home Went to primary MD today who sent her to ER    Note pt had bronchitis in May  Felt similar with cough,.  Chest pain Placed on 15 prednisone bid for a couple weeks  Tapered  Finished taper a couple wks agol   No changes in joints    Note pt had DVT on pradaxa in past Treated with lovenox  Switched to Xarelto     Past Medical History  Diagnosis Date  . Chest pain   . Atrial fibrillation   . Anemia     hx of  . Hypertension   . Diabetes mellitus   . Rheumatoid arthritis(714.0)   . Anxiety and depression   . Gastroesophageal reflux disease   . Hyperlipidemia   . Hepatitis 1970  . Jaundice     age 82  . Actinic keratosis   . Atrial fibrillation   . Carpal tunnel syndrome   . Edema   . Hiatal hernia   . Memory loss   . Thyroid disease     multi nodular goiter     (Not in a hospital admission)   . diltiazem  10 mg Intravenous Once    Infusions: . diltiazem (CARDIZEM) infusion      No Known Allergies  History   Social History  . Marital Status: Divorced    Spouse Name: N/A  . Number of Children: N/A  . Years of Education: N/A   Occupational History  . Not on file.   Social History Main Topics  . Smoking status: Never Smoker   . Smokeless tobacco: Never Used  . Alcohol Use: No  . Drug Use: No  . Sexual Activity: Not Currently   Other Topics Concern  . Not on file   Social  History Narrative    Family History  Problem Relation Age of Onset  . Heart failure Sister   . Heart attack Neg Hx   . Stroke Father   . Hypertension Sister     REVIEW OF SYSTEMS:  All systems reviewed  Negative to the above problem except as noted above.    PHYSICAL EXAM: Filed Vitals:   10/08/14 1845  BP: 156/79  Pulse: 137  Temp:   Resp: 22    No intake or output data in the 24 hours ending 10/08/14 2019  General:  Well appearing. No respiratory difficulty HEENT: normal Neck: supple. no JVD. Carotids 2+ bilat; no bruits. No lymphadenopathy or thryomegaly appreciated. Cor: PMI nondisplaced. Regular rate & rhythm. No rubs, gallops or murmurs. Lungs: clear  NO wheezes  Occasional rale at R base Chest/back  Nontender   Abdomen: soft, nontender, nondistended. No hepatosplenomegaly. No bruits or masses. Good bowel sounds. Extremities: no cyanosis, clubbing, rash, edema  Joint deformities consistent with RA Neuro: alert & oriented x 3, cranial nerves  grossly intact. moves all 4 extremities w/o difficulty. Affect pleasant.  ECG:  Atrial fib 115 bpm  SL ST depression in lateral leads  Unchanged from previous    Results for orders placed or performed during the hospital encounter of 10/08/14 (from the past 24 hour(s))  CBC with Differential/Platelet     Status: Abnormal   Collection Time: 10/08/14  4:39 PM  Result Value Ref Range   WBC 15.7 (H) 4.0 - 10.5 K/uL   RBC 4.63 3.87 - 5.11 MIL/uL   Hemoglobin 10.3 (L) 12.0 - 15.0 g/dL   HCT 34.1 (L) 36.0 - 46.0 %   MCV 73.7 (L) 78.0 - 100.0 fL   MCH 22.2 (L) 26.0 - 34.0 pg   MCHC 30.2 30.0 - 36.0 g/dL   RDW 17.3 (H) 11.5 - 15.5 %   Platelets 248 150 - 400 K/uL   Neutrophils Relative % 69 43 - 77 %   Lymphocytes Relative 20 12 - 46 %   Monocytes Relative 10 3 - 12 %   Eosinophils Relative 1 0 - 5 %   Basophils Relative 0 0 - 1 %   Neutro Abs 10.8 (H) 1.7 - 7.7 K/uL   Lymphs Abs 3.1 0.7 - 4.0 K/uL   Monocytes Absolute 1.6 (H)  0.1 - 1.0 K/uL   Eosinophils Absolute 0.2 0.0 - 0.7 K/uL   Basophils Absolute 0.0 0.0 - 0.1 K/uL   RBC Morphology POLYCHROMASIA PRESENT    WBC Morphology ATYPICAL LYMPHOCYTES   Basic metabolic panel     Status: Abnormal   Collection Time: 10/08/14  4:39 PM  Result Value Ref Range   Sodium 137 135 - 145 mmol/L   Potassium 3.6 3.5 - 5.1 mmol/L   Chloride 99 (L) 101 - 111 mmol/L   CO2 26 22 - 32 mmol/L   Glucose, Bld 185 (H) 65 - 99 mg/dL   BUN 8 6 - 20 mg/dL   Creatinine, Ser 0.57 0.44 - 1.00 mg/dL   Calcium 8.4 (L) 8.9 - 10.3 mg/dL   GFR calc non Af Amer >60 >60 mL/min   GFR calc Af Amer >60 >60 mL/min   Anion gap 12 5 - 15  APTT     Status: None   Collection Time: 10/08/14  4:39 PM  Result Value Ref Range   aPTT 32 24 - 37 seconds  Protime-INR     Status: Abnormal   Collection Time: 10/08/14  4:39 PM  Result Value Ref Range   Prothrombin Time 17.4 (H) 11.6 - 15.2 seconds   INR 1.41 0.00 - 1.49  Troponin I     Status: Abnormal   Collection Time: 10/08/14  4:39 PM  Result Value Ref Range   Troponin I 0.04 (H) <0.031 ng/mL  Brain natriuretic peptide     Status: Abnormal   Collection Time: 10/08/14  4:39 PM  Result Value Ref Range   B Natriuretic Peptide 226.8 (H) 0.0 - 100.0 pg/mL   Dg Chest 2 View  10/08/2014   CLINICAL DATA:  Initial encounter for chest pain and tightness since last night. Dry cough since yesterday.  EXAM: CHEST  2 VIEW  COMPARISON:  11/29/2013.  FINDINGS: The lungs are clear without focal infiltrate, edema, pneumothorax or pleural effusion. Interstitial markings are diffusely coarsened with chronic features. Cardiopericardial silhouette is at upper limits of normal for size. Old left fourth rib fracture again noted.  IMPRESSION: Stable.  No acute cardiopulmonary findings.   Electronically Signed   By: Randall Hiss  Tery Sanfilippo M.D.   On: 10/08/2014 16:53   Ct Angio Chest Pe W/cm &/or Wo Cm  10/08/2014   CLINICAL DATA:  Chest pain radiating into the back and between the  shoulder blades beginning 10/07/2014. No known injury. Initial encounter.  EXAM: CT ANGIOGRAPHY CHEST WITH CONTRAST  TECHNIQUE: Multidetector CT imaging of the chest was performed using the standard protocol during bolus administration of intravenous contrast. Multiplanar CT image reconstructions and MIPs were obtained to evaluate the vascular anatomy.  CONTRAST:  100 mL OMNIPAQUE IOHEXOL 350 MG/ML SOLN  COMPARISON:  PA and lateral chest this same day.  FINDINGS: No pulmonary embolus is identified. Bovine type aortic arch is incidentally noted. Scattered calcific aortic and coronary atherosclerosis is noted. Heart size is upper normal. No pleural or pericardial effusion. No axillary, hilar or mediastinal lymphadenopathy. The lungs show only mild dependent atelectatic change. Visualized upper abdomen is unremarkable. No focal bony abnormality is identified. Mild appearing thoracic spondylosis is noted.  Review of the MIP images confirms the above findings.  IMPRESSION: Negative for pulmonary embolus. No acute abnormality or finding to explain the patient's symptoms.  Scattered aortic and coronary atherosclerosis.   Electronically Signed   By: Inge Rise M.D.   On: 10/08/2014 18:52     ASSESSMENT  69 yo who is followed by T Brackbill.  I know her as her husband is a pt of mine Presents with a couple days of cough, pleuritic CP  Found to be in rapid afib  Rates have been constant 110s to 120s since in ER Labs signif for increased WBC  Trop 0.04  CT neg for PE  (had DVT on pradaxa in past) Patient is a little uncomfortable but in NAD  1.  Afib   CHADSVASc  Of 3  Rates not controlled  Will admit to follow  COntinue IV dilt  Add short acting lopressor  Dig   WIll plan to admit for rate control  Add short acting lopressor  Continue IV dilt for now.   Watch BP as titrate  2  Cough /chest pain  Chest pain does not appear to be cardiac in orign  Probable bronchitis. Cough suppressant  Pain meds prn  3.   Rheumatology  (RA)  Will check ESR, UA  Increase prednisone to 15 bid   COntineu Arava Contact rheum in morning for suggestions re RA.  There is no rub on exam  I would hold on echo for now.    3.  DM  Hold Metformin given CT  (48 hours)

## 2014-10-09 ENCOUNTER — Encounter (HOSPITAL_COMMUNITY): Payer: Self-pay | Admitting: General Practice

## 2014-10-09 ENCOUNTER — Inpatient Hospital Stay (HOSPITAL_COMMUNITY): Payer: Medicare Other

## 2014-10-09 DIAGNOSIS — I481 Persistent atrial fibrillation: Principal | ICD-10-CM

## 2014-10-09 DIAGNOSIS — I4891 Unspecified atrial fibrillation: Secondary | ICD-10-CM

## 2014-10-09 DIAGNOSIS — E11319 Type 2 diabetes mellitus with unspecified diabetic retinopathy without macular edema: Secondary | ICD-10-CM

## 2014-10-09 DIAGNOSIS — E780 Pure hypercholesterolemia: Secondary | ICD-10-CM

## 2014-10-09 DIAGNOSIS — I10 Essential (primary) hypertension: Secondary | ICD-10-CM

## 2014-10-09 LAB — URINALYSIS, ROUTINE W REFLEX MICROSCOPIC
Bilirubin Urine: NEGATIVE
GLUCOSE, UA: 100 mg/dL — AB
HGB URINE DIPSTICK: NEGATIVE
KETONES UR: NEGATIVE mg/dL
LEUKOCYTES UA: NEGATIVE
Nitrite: NEGATIVE
PH: 6 (ref 5.0–8.0)
Protein, ur: 30 mg/dL — AB
SPECIFIC GRAVITY, URINE: 1.03 (ref 1.005–1.030)
Urobilinogen, UA: 1 mg/dL (ref 0.0–1.0)

## 2014-10-09 LAB — CBC WITH DIFFERENTIAL/PLATELET
Basophils Absolute: 0 10*3/uL (ref 0.0–0.1)
Basophils Relative: 0 % (ref 0–1)
EOS ABS: 0.1 10*3/uL (ref 0.0–0.7)
EOS PCT: 1 % (ref 0–5)
HEMATOCRIT: 32.8 % — AB (ref 36.0–46.0)
Hemoglobin: 9.8 g/dL — ABNORMAL LOW (ref 12.0–15.0)
LYMPHS ABS: 1.3 10*3/uL (ref 0.7–4.0)
Lymphocytes Relative: 9 % — ABNORMAL LOW (ref 12–46)
MCH: 21.8 pg — ABNORMAL LOW (ref 26.0–34.0)
MCHC: 29.9 g/dL — ABNORMAL LOW (ref 30.0–36.0)
MCV: 73.1 fL — ABNORMAL LOW (ref 78.0–100.0)
MONO ABS: 1.2 10*3/uL — AB (ref 0.1–1.0)
MONOS PCT: 8 % (ref 3–12)
Neutro Abs: 11.9 10*3/uL — ABNORMAL HIGH (ref 1.7–7.7)
Neutrophils Relative %: 82 % — ABNORMAL HIGH (ref 43–77)
PLATELETS: 279 10*3/uL (ref 150–400)
RBC: 4.49 MIL/uL (ref 3.87–5.11)
RDW: 17.5 % — ABNORMAL HIGH (ref 11.5–15.5)
WBC: 14.5 10*3/uL — ABNORMAL HIGH (ref 4.0–10.5)

## 2014-10-09 LAB — COMPREHENSIVE METABOLIC PANEL
ALK PHOS: 104 U/L (ref 38–126)
ALT: 18 U/L (ref 14–54)
AST: 16 U/L (ref 15–41)
Albumin: 3.1 g/dL — ABNORMAL LOW (ref 3.5–5.0)
Anion gap: 10 (ref 5–15)
BILIRUBIN TOTAL: 1 mg/dL (ref 0.3–1.2)
BUN: 8 mg/dL (ref 6–20)
CHLORIDE: 102 mmol/L (ref 101–111)
CO2: 26 mmol/L (ref 22–32)
Calcium: 9.1 mg/dL (ref 8.9–10.3)
Creatinine, Ser: 0.56 mg/dL (ref 0.44–1.00)
Glucose, Bld: 274 mg/dL — ABNORMAL HIGH (ref 65–99)
POTASSIUM: 3.5 mmol/L (ref 3.5–5.1)
SODIUM: 138 mmol/L (ref 135–145)
Total Protein: 7.2 g/dL (ref 6.5–8.1)

## 2014-10-09 LAB — URINE MICROSCOPIC-ADD ON

## 2014-10-09 LAB — DIGOXIN LEVEL: DIGOXIN LVL: 0.3 ng/mL — AB (ref 0.8–2.0)

## 2014-10-09 LAB — GLUCOSE, CAPILLARY
GLUCOSE-CAPILLARY: 317 mg/dL — AB (ref 65–99)
GLUCOSE-CAPILLARY: 147 mg/dL — AB (ref 65–99)
Glucose-Capillary: 335 mg/dL — ABNORMAL HIGH (ref 65–99)
Glucose-Capillary: 386 mg/dL — ABNORMAL HIGH (ref 65–99)

## 2014-10-09 LAB — SEDIMENTATION RATE: Sed Rate: 38 mm/hr — ABNORMAL HIGH (ref 0–22)

## 2014-10-09 LAB — TSH: TSH: 0.784 u[IU]/mL (ref 0.350–4.500)

## 2014-10-09 MED ORDER — PREDNISONE 5 MG PO TABS
15.0000 mg | ORAL_TABLET | Freq: Every day | ORAL | Status: DC
  Administered 2014-10-10: 15 mg via ORAL
  Filled 2014-10-09 (×2): qty 1

## 2014-10-09 MED ORDER — METOPROLOL SUCCINATE ER 100 MG PO TB24
100.0000 mg | ORAL_TABLET | Freq: Two times a day (BID) | ORAL | Status: DC
  Administered 2014-10-09 – 2014-10-10 (×3): 100 mg via ORAL
  Filled 2014-10-09 (×3): qty 1

## 2014-10-09 MED ORDER — METOPROLOL TARTRATE 1 MG/ML IV SOLN
5.0000 mg | Freq: Once | INTRAVENOUS | Status: AC
  Administered 2014-10-09: 5 mg via INTRAVENOUS
  Filled 2014-10-09: qty 5

## 2014-10-09 NOTE — Care Management Note (Signed)
Case Management Note  Patient Details  Name: [name] MRN: [id number] Date of Birth: 09-05-1945  Subjective/Objective:  Pt admitted for A fib.                   Action/Plan: CM to monitor for disposition needs.   Expected Discharge Date:                  Expected Discharge Plan:  Home/Self Care  In-House Referral:     Discharge planning Services  CM Consult  Post Acute Care Choice:    Choice offered to:     DME Arranged:    DME Agency:     HH Arranged:    HH Agency:     Status of Service:  In process, will continue to follow  Medicare Important Message Given:    Date Medicare IM Given:    Medicare IM give by:    Date Additional Medicare IM Given:    Additional Medicare Important Message give by:     If discussed at [place] of Stay Meetings, dates discussed:    Additional Comments:  Bethena Roys, RN 10/09/2014, 1:50 PM

## 2014-10-09 NOTE — Progress Notes (Signed)
Inpatient Diabetes Program Recommendations  AACE/ADA: New Consensus Statement on Inpatient Glycemic Control (2013)  Target Ranges:  Prepandial:   less than 140 mg/dL      Peak postprandial:   less than 180 mg/dL (1-2 hours)      Critically ill patients:  140 - 180 mg/dL   Reason for Assessment: Results for [name], [name] (MRN [id number]) as of 10/09/2014 15:29  Ref. Range 10/08/2014 23:03 10/09/2014 07:38 10/09/2014 11:09  Glucose-Capillary Latest Ref Range: 65-99 mg/dL 309 (H) 335 (H) 317 (H)   Diabetes history: Type 2 diabetes Outpatient Diabetes medications: Metformin 1000 mg bid Current orders for Inpatient glycemic control:  Novolog sensitive tid with meals and HS, Prednisone 15 mg daily  Note that CBG's greater than goal.  Please consider checking A1C.  Also consider adding Lantus 15 units daily to meet basal insulin needs while patient is in the hospital.    Thanks, Adah Perl, RN, BC-ADM Inpatient Diabetes Coordinator Pager [phone] (8a-5p)

## 2014-10-09 NOTE — Progress Notes (Signed)
UR Completed Kymoni Monday Graves-Bigelow, RN,BSN [phone]  

## 2014-10-09 NOTE — Progress Notes (Signed)
  Principal Problem:   Atrial fibrillation Active Problems:   Hypertension   DM2 (diabetes mellitus, type 2)   Rheumatoid arthritis   Hypercholesterolemia   Gastroesophageal reflux disease   OA (osteoarthritis) of hip   Subjective: Feeling okay this morning. No palpitations or dyspnea despite ongoing afib in the 130's to 140's @ times.     Objective: Vital signs in last 24 hours: Temp: [97.8 F (36.6 C)-98.5 F (36.9 C)] 97.8 F (36.6 C) (07/07 0743) Pulse Rate: [46-155] 131 (07/07 0813) Resp: [16-36] 20 (07/06 2252) BP: (115-184)/(59-145) 136/77 mmHg (07/07 0743) SpO2: [94 %-100 %] 95 % (07/07 0602) Weight: [76.703 kg (169 lb 1.6 oz)] 76.703 kg (169 lb 1.6 oz) (07/06 2252) Last BM Date: 10/08/14  Intake/Output from previous day: 07/06 0701 - 07/07 0700 In: 240 [P.O.:240] Out: 1275 [Urine:1275] Intake/Output this shift: Total I/O In: 360 [P.O.:360] Out: -   Medications Current Facility-Administered Medications  Medication Dose Route Frequency Provider Last Rate Last Dose  . 0.9 % sodium chloride infusion 250 mL Intravenous PRN Paula Ross V, MD    . [START ON 10/14/2014] Abatacept SOSY 125 mg 125 mg Subcutaneous Weekly Paula Ross V, MD    . acetaminophen (TYLENOL) tablet 650 mg 650 mg Oral Q4H PRN Paula Ross V, MD  650 mg at 10/08/14 2325  . buPROPion (WELLBUTRIN SR) 12 hr tablet 150 mg 150 mg Oral BID Paula Ross V, MD  150 mg at 10/09/14 0813  . celecoxib (CELEBREX) capsule 100 mg 100 mg Oral BID Paula Ross V, MD  100 mg at 10/08/14 2325  . cholecalciferol (VITAMIN D) tablet 2,000 Units 2,000 Units Oral Daily Paula Ross V, MD  2,000 Units at 10/09/14 0813  . digoxin (LANOXIN) tablet 0.125 mg 0.125 mg Oral Daily Paula Ross V, MD  0.125 mg at 10/09/14 0814  . diltiazem (CARDIZEM) 100 mg in dextrose 5 % 100 mL (1 mg/mL) infusion 5-15 mg/hr Intravenous Titrated Paula Ross V, MD 15  mL/hr at 10/09/14 0255 15 mg/hr at 10/09/14 0255  . furosemide (LASIX) tablet 40 mg 40 mg Oral Daily Paula Ross V, MD  40 mg at 10/09/14 0814  . insulin aspart (novoLOG) injection 0-5 Units 0-5 Units Subcutaneous QHS Paula Ross V, MD  4 Units at 10/08/14 2328  . insulin aspart (novoLOG) injection 0-9 Units 0-9 Units Subcutaneous TID WC Paula Ross V, MD  7 Units at 10/09/14 0814  . leflunomide (ARAVA) tablet 20 mg 20 mg Oral Daily Paula Ross V, MD  20 mg at 10/09/14 0915  . nitroGLYCERIN (NITROSTAT) SL tablet 0.4 mg 0.4 mg Sublingual Q5 min PRN Paula Ross V, MD    . omega-3 acid ethyl esters (LOVAZA) capsule 1 g 1 g Oral BID Paula Ross V, MD  1 g at 10/09/14 0814  . ondansetron (ZOFRAN) injection 4 mg 4 mg Intravenous Q6H PRN Paula Ross V, MD    . pantoprazole (PROTONIX) EC tablet 40 mg 40 mg Oral Daily Paula Ross V, MD  40 mg at 10/09/14 0814  . potassium chloride (K-DUR) CR tablet 20 mEq 20 mEq Oral BID Paula Ross V, MD  20 mEq at 10/09/14 0813  . [START ON 10/10/2014] predniSONE (DELTASONE) tablet 15 mg 15 mg Oral Q breakfast Paula Ross V, MD    . rivaroxaban (XARELTO) tablet 20 mg 20 mg Oral Q supper Paula Ross V, MD    . sodium chloride 0.9 % injection 3 mL 3 mL Intravenous Q12H Paula Ross V, MD    3 mL at 10/08/14 2300  . sodium chloride 0.9 % injection 3 mL 3 mL Intravenous PRN Fay Records, MD    . vitamin B-12 (CYANOCOBALAMIN) tablet 2,000 mcg 2,000 mcg Oral Daily Fay Records, MD  2,000 mcg at 10/09/14 0813    PE: Constitutional: She appears well-developed and well-nourished. Pleasant, NAD. HEENT: Normal.  Head: Normocephalic and atraumatic.  Neck: Neck supple. No lymphadenopathy. Cardiovascular: IR, IR rhythm, tachycardic. No murmurs/rubs. Pulmonary/Chest: Effort normal. No respiratory distress. CTA. Abdominal: Soft. She exhibits no  distension. There is no tenderness.  Musculoskeletal: She exhibits unilateral trace LLE edema (chronic). Degenerative joint changes in bilateral hands and wrists.  Neurological: She is alert and oriented to person, place, and time.  Skin: Skin is warm and dry.   Lab Results:   Recent Labs (last 2 labs)      Recent Labs  10/08/14 1639 10/09/14 0231  WBC 15.7* 14.5*  HGB 10.3* 9.8*  HCT 34.1* 32.8*  PLT 248 279     BMET  Recent Labs (last 2 labs)      Recent Labs  10/08/14 1639 10/09/14 0231  NA 137 138  K 3.6 3.5  CL 99* 102  CO2 26 26  GLUCOSE 185* 274*  BUN 8 8  CREATININE 0.57 0.56  CALCIUM 8.4* 9.1     PT/INR  Recent Labs (last 2 labs)      Recent Labs  10/08/14 1639  LABPROT 17.4*  INR 1.41     Studies/Results: CTA chest 10/08/14: Negative for pulmonary embolus. No acute abnormality or finding to explain the patient's symptoms. Scattered aortic and coronary atherosclerosis.  Assessment/Plan  1. Atrial fibrillation with RVR: - 69 y/o female with a h/o persistent AF since February 2012 that was admitted yesterday after seeing PCP for cough and was found to be in rapid afib - ? Underlying cause. Mildly anemic though not profoundly more so than baseline.  WBC mildly elevated in setting of chronic steroids and leukocytosis.  CXR non-acute.  CTA neg. TSH nl. - Was initially anticoagulated with Pradaxa but switched to Xarelto in April due to LLE DVT. She has not missed any doses.  CTA neg for PE. - She has had prior issues with rate control in the summer of 2015 but this improved following addition of digoxin @ that time. - Currently on diltiazem drip at 15 mg/hr with poor response, though she remains asymptomatic.  HR 130-150.  - BB not ordered on admission.  Will give 5 IV now and resume toprol xl 100 bid. - Continue digoxin @ current dose. - If hr comes down with addition of bb, hope to be able to wean dilt  and resume home dose of dilt CD. - Echo pending.  2. Cough/atypical chest pain: - No pain overnight.  - Possibly related to bronchitis (recently treated for bronchitis in May).  - Per Dr. Harrington Challenger, prednisone 15 mg daily for today and tomorrow, then 10 mg for 2 days, then return to 5 mg daily.  3. Rheumatoid arthritis: - Dr. Harrington Challenger spoke with rheumatology PA Harrel Carina today regarding prednisone. - ESR 38.  - Continue Arava. - Echo pending.   4. Myelodysplastic syndrome/microcytic anemia: - Chronically myelodysplastic.  - Evaluated by Dr. Burr Medico in April who felt her likelihood of bone marrow disease was low. He recommended bone marrow biopsy if her leukocytosis worsens or if she develops new blood count abnormalities. - Hgb 9.8. Progressively trending down over the last 5 months: 13.2 -> 12.2 ->  10.3 -> 9.8. - RBC differential showing elliptocytes.    , NP 10/09/2014, 12:36 PM         

## 2014-10-09 NOTE — Progress Notes (Signed)
  Echocardiogram 2D Echocardiogram has been performed.  Diamond Nickel 10/09/2014, 12:59 PM

## 2014-10-09 NOTE — Progress Notes (Addendum)
Spoke to [employer]  She was not aware of 15 bid prednisone dose in May If no signs of pleuritis on CT would recomm 15 mg daily for 2 days then 10 x 2 days the return to 5 mg  I have ordered echo to eval pericardium  No rub on exam.  BP good.

## 2014-10-09 NOTE — Discharge Instructions (Addendum)
Information on my medicine - XARELTO (Rivaroxaban)  This medication education was reviewed with me or my healthcare representative as part of my discharge preparation.  The pharmacist that spoke with me during my hospital stay was:  Dareen Piano, [facility]  Why was Xarelto prescribed for you? Xarelto was prescribed for you to reduce the risk of a blood clot forming that can cause a stroke if you have a medical condition called atrial fibrillation (a type of irregular heartbeat).  What do you need to know about xarelto ? Take your Xarelto ONCE DAILY at the same time every day with your evening meal. If you have difficulty swallowing the tablet whole, you may crush it and mix in applesauce just prior to taking your dose.  Take Xarelto exactly as prescribed by your doctor and DO NOT stop taking Xarelto without talking to the doctor who prescribed the medication.  Stopping without other stroke prevention medication to take the place of Xarelto may increase your risk of developing a clot that causes a stroke.  Refill your prescription before you run out.  After discharge, you should have regular check-up appointments with your healthcare provider that is prescribing your Xarelto.  In the future your dose may need to be changed if your kidney function or weight changes by a significant amount.  What do you do if you miss a dose? If you are taking Xarelto ONCE DAILY and you miss a dose, take it as soon as you remember on the same day then continue your regularly scheduled once daily regimen the next day. Do not take two doses of Xarelto at the same time or on the same day.   Important Safety Information A possible side effect of Xarelto is bleeding. You should call your healthcare provider right away if you experience any of the following: ? Bleeding from an injury or your nose that does not stop. ? Unusual colored urine (red or dark brown) or unusual colored stools (red or  black). ? Unusual bruising for unknown reasons. ? A serious fall or if you hit your head (even if there is no bleeding).  Some medicines may interact with Xarelto and might increase your risk of bleeding while on Xarelto. To help avoid this, consult your healthcare provider or pharmacist prior to using any new prescription or non-prescription medications, including herbals, vitamins, non-steroidal anti-inflammatory drugs (NSAIDs) and supplements.  This website has more information on Xarelto: [email or web address]  Atrial Fibrillation Atrial fibrillation is a type of irregular heart rhythm (arrhythmia). During atrial fibrillation, the upper chambers of the heart (atria) quiver continuously in a chaotic pattern. This causes an irregular and often rapid heart rate.  Atrial fibrillation is the result of the heart becoming overloaded with disorganized signals that tell it to beat. These signals are normally released one at a time by a part of the right atrium called the sinoatrial node. They then travel from the atria to the lower chambers of the heart (ventricles), causing the atria and ventricles to contract and pump blood as they pass. In atrial fibrillation, parts of the atria outside of the sinoatrial node also release these signals. This results in two problems. First, the atria receive so many signals that they do not have time to fully contract. Second, the ventricles, which can only receive one signal at a time, beat irregularly and out of rhythm with the atria.  There are three types of atrial fibrillation:   Paroxysmal. Paroxysmal atrial fibrillation starts suddenly and stops on its  own within a week.  Persistent. Persistent atrial fibrillation lasts for more than a week. It may stop on its own or with treatment.  Permanent. Permanent atrial fibrillation does not go away. Episodes of atrial fibrillation may lead to permanent atrial fibrillation. Atrial fibrillation can prevent your heart from  pumping blood normally. It increases your risk of stroke and can lead to heart failure.  CAUSES   Heart conditions, including a heart attack, heart failure, coronary artery disease, and heart valve conditions.   Inflammation of the sac that surrounds the heart (pericarditis).  Blockage of an artery in the lungs (pulmonary embolism).  Pneumonia or other infections.  Chronic lung disease.  Thyroid problems, especially if the thyroid is overactive (hyperthyroidism).  Caffeine, excessive alcohol use, and use of some illegal drugs.   Use of some medicines, including certain decongestants and diet pills.  Heart surgery.   Birth defects.  Sometimes, no cause can be found. When this happens, the atrial fibrillation is called lone atrial fibrillation. The risk of complications from atrial fibrillation increases if you have lone atrial fibrillation and you are age 75 years or older. RISK FACTORS  Heart failure.  Coronary artery disease.  Diabetes mellitus.   High blood pressure (hypertension).   Obesity.   Other arrhythmias.   Increased age. SIGNS AND SYMPTOMS   A feeling that your heart is beating rapidly or irregularly.   A feeling of discomfort or pain in your chest.   Shortness of breath.   Sudden light-headedness or weakness.   Getting tired easily when exercising.   Urinating more often than normal (mainly when atrial fibrillation first begins).  In paroxysmal atrial fibrillation, symptoms may start and suddenly stop. DIAGNOSIS  Your health care provider may be able to detect atrial fibrillation when taking your pulse. Your health care provider may have you take a test called an ambulatory electrocardiogram (ECG). An ECG records your heartbeat patterns over a 24-hour period. You may also have other tests, such as:  Transthoracic echocardiogram (TTE). During echocardiography, sound waves are used to evaluate how blood flows through your  heart.  Transesophageal echocardiogram (TEE).  Stress test. There is more than one type of stress test. If a stress test is needed, ask your health care provider about which type is best for you.  Chest X-ray exam.  Blood tests.  Computed tomography (CT). TREATMENT  Treatment may include:  Treating any underlying conditions. For example, if you have an overactive thyroid, treating the condition may correct atrial fibrillation.  Taking medicine. Medicines may be given to control a rapid heart rate or to prevent blood clots, heart failure, or a stroke.  Having a procedure to correct the rhythm of the heart:  Electrical cardioversion. During electrical cardioversion, a controlled, low-energy shock is delivered to the heart through your skin. If you have chest pain, very low blood pressure, or sudden heart failure, this procedure may need to be done as an emergency.  Catheter ablation. During this procedure, heart tissues that send the signals that cause atrial fibrillation are destroyed.  Surgical ablation. During this surgery, thin lines of heart tissue that carry the abnormal signals are destroyed. This procedure can either be an open-heart surgery or a minimally invasive surgery. With the minimally invasive surgery, small cuts are made to access the heart instead of a large opening.  Pulmonary venous isolation. During this surgery, tissue around the veins that carry blood from the lungs (pulmonary veins) is destroyed. This tissue is thought to carry  the abnormal signals. HOME CARE INSTRUCTIONS   Take medicines only as directed by your health care provider. Some medicines can make atrial fibrillation worse or recur.  If blood thinners were prescribed by your health care provider, take them exactly as directed. Too much blood-thinning medicine can cause bleeding. If you take too little, you will not have the needed protection against stroke and other problems.  Perform blood tests at  home if directed by your health care provider. Perform blood tests exactly as directed.  Quit smoking if you smoke.  Do not drink alcohol.  Do not drink caffeinated beverages such as coffee, soda, and some teas. You may drink decaffeinated coffee, soda, or tea.   Maintain a healthy weight.Do not use diet pills unless your health care provider approves. They may make heart problems worse.   Follow diet instructions as directed by your health care provider.  Exercise regularly as directed by your health care provider.  Keep all follow-up visits as directed by your health care provider. This is important. PREVENTION  The following substances can cause atrial fibrillation to recur:   Caffeinated beverages.  Alcohol.  Certain medicines, especially those used for breathing problems.  Certain herbs and herbal medicines, such as those containing ephedra or ginseng.  Illegal drugs, such as cocaine and amphetamines. Sometimes medicines are given to prevent atrial fibrillation from recurring. Proper treatment of any underlying condition is also important in helping prevent recurrence.  SEEK MEDICAL CARE IF:  You notice a change in the rate, rhythm, or strength of your heartbeat.  You suddenly begin urinating more frequently.  You tire more easily when exerting yourself or exercising. SEEK IMMEDIATE MEDICAL CARE IF:   You have chest pain, abdominal pain, sweating, or weakness.  You feel nauseous.  You have shortness of breath.  You suddenly have swollen feet and ankles.  You feel dizzy.  Your face or limbs feel numb or weak.  You have a change in your vision or speech. MAKE SURE YOU:   Understand these instructions.  Will watch your condition.  Will get help right away if you are not doing well or get worse. Document Released: 03/21/2005 Document Revised: 08/05/2013 Document Reviewed: 05/01/2012 [facility] Patient Information 2015 [place], [place]. This information is  not intended to replace advice given to you by your health care provider. Make sure you discuss any questions you have with your health care provider.   Please take 10mg  (2 tablets) of prednisone for 2 days, before going down to 5mg  of prednisone daily thereafter. Please followup with your rheumatologist regarding duration of steroid therapy.

## 2014-10-10 ENCOUNTER — Telehealth: Payer: Self-pay | Admitting: Physician Assistant

## 2014-10-10 LAB — GLUCOSE, CAPILLARY
Glucose-Capillary: 191 mg/dL — ABNORMAL HIGH (ref 65–99)
Glucose-Capillary: 307 mg/dL — ABNORMAL HIGH (ref 65–99)

## 2014-10-10 MED ORDER — METOPROLOL SUCCINATE ER 50 MG PO TB24: 150.0000 mg | ORAL_TABLET | Freq: Two times a day (BID) | ORAL | Status: DC

## 2014-10-10 MED ORDER — PREDNISONE 5 MG PO TABS
5.0000 mg | ORAL_TABLET | Freq: Every day | ORAL | Status: DC
Start: 2014-10-10 — End: 2015-03-18

## 2014-10-10 MED ORDER — DILTIAZEM HCL ER COATED BEADS 360 MG PO CP24: 360.0000 mg | ORAL_CAPSULE | Freq: Every day | ORAL | Status: DC

## 2014-10-10 MED ORDER — DILTIAZEM HCL ER COATED BEADS 120 MG PO TB24: 360.0000 mg | ORAL_TABLET | Freq: Every day | ORAL | Status: DC

## 2014-10-10 MED ORDER — DILTIAZEM HCL ER COATED BEADS 180 MG PO CP24
360.0000 mg | ORAL_CAPSULE | Freq: Every day | ORAL | Status: DC
  Administered 2014-10-10: 360 mg via ORAL
  Filled 2014-10-10: qty 2

## 2014-10-10 NOTE — Care Management (Signed)
Important Message  Patient Details  Name: [name] MRN: [id number] Date of Birth: October 13, 1945   Medicare Important Message Given:  Yes-second notification given    Delorse Lek 10/10/2014, 10:36 AM

## 2014-10-10 NOTE — Telephone Encounter (Signed)
TCM per [place] 7/21 @ 10am w/Rhonda

## 2014-10-10 NOTE — Progress Notes (Signed)
Subjective: Feeling well today. No chest pains or palpitations.   Objective: Vital signs in last 24 hours: Temp:  [97.8 F (36.6 C)-98.4 F (36.9 C)] 97.9 F (36.6 C) (07/08 0756) Pulse Rate:  [80-118] 80 (07/08 0756) Resp:  [18-20] 18 (07/08 0756) BP: (114-140)/(63-94) 138/86 mmHg (07/08 0756) SpO2:  [96 %-100 %] 99 % (07/08 0756) Weight:  [73.846 kg (162 lb 12.8 oz)] 73.846 kg (162 lb 12.8 oz) (07/08 0517) Last BM Date: 10/10/14  Intake/Output from previous day: 07/07 0701 - 07/08 0700 In: 840 [P.O.:840] Out: 1150 [Urine:1150]  Medications Current Facility-Administered Medications  Medication Dose Route Frequency Provider Last Rate Last Dose  . 0.9 %  sodium chloride infusion  250 mL Intravenous PRN Fay Records, MD      . acetaminophen (TYLENOL) tablet 650 mg  650 mg Oral Q4H PRN Fay Records, MD   650 mg at 10/08/14 2325  . buPROPion [facility] SR) 12 hr tablet 150 mg  150 mg Oral BID Fay Records, MD   150 mg at 10/10/14 0811  . celecoxib (CELEBREX) capsule 100 mg  100 mg Oral BID Fay Records, MD   100 mg at 10/10/14 0810  . cholecalciferol (VITAMIN D) tablet 2,000 Units  2,000 Units Oral Daily Fay Records, MD   2,000 Units at 10/10/14 [phone]  . digoxin (LANOXIN) tablet 0.125 mg  0.125 mg Oral Daily Fay Records, MD   0.125 mg at 10/10/14 0810  . diltiazem (CARDIZEM) 100 mg in dextrose 5 % 100 mL (1 mg/mL) infusion  5-15 mg/hr Intravenous Titrated Fay Records, MD 5 mL/hr at 10/10/14 0312 5 mg/hr at 10/10/14 0312  . furosemide (LASIX) tablet 40 mg  40 mg Oral Daily Fay Records, MD   40 mg at 10/10/14 0810  . insulin aspart (novoLOG) injection 0-5 Units  0-5 Units Subcutaneous QHS Fay Records, MD   4 Units at 10/08/14 2328  . insulin aspart (novoLOG) injection 0-9 Units  0-9 Units Subcutaneous TID WC Fay Records, MD   2 Units at 10/10/14 0809  . leflunomide (ARAVA) tablet 20 mg  20 mg Oral Daily Fay Records, MD   20 mg at 10/09/14 0915  . metoprolol succinate  (TOPROL-XL) 24 hr tablet 100 mg  100 mg Oral BID Rogelia Mire, NP   100 mg at 10/10/14 0811  . nitroGLYCERIN (NITROSTAT) SL tablet 0.4 mg  0.4 mg Sublingual Q5 min PRN Fay Records, MD      . omega-3 acid ethyl esters (LOVAZA) capsule 1 g  1 g Oral BID Fay Records, MD   1 g at 10/10/14 [phone]  . ondansetron (ZOFRAN) injection 4 mg  4 mg Intravenous Q6H PRN Fay Records, MD      . pantoprazole (PROTONIX) EC tablet 40 mg  40 mg Oral Daily Fay Records, MD   40 mg at 10/10/14 0811  . potassium chloride (K-DUR) CR tablet 20 mEq  20 mEq Oral BID Fay Records, MD   20 mEq at 10/10/14 0810  . predniSONE (DELTASONE) tablet 15 mg  15 mg Oral Q breakfast Fay Records, MD   15 mg at 10/10/14 0810  . rivaroxaban (XARELTO) tablet 20 mg  20 mg Oral Q supper Fay Records, MD   20 mg at 10/09/14 1729  . sodium chloride 0.9 % injection 3 mL  3 mL Intravenous Q12H Fay Records, MD   3 mL  at 10/09/14 2200  . sodium chloride 0.9 % injection 3 mL  3 mL Intravenous PRN Fay Records, MD      . vitamin B-12 (CYANOCOBALAMIN) tablet 2,000 mcg  2,000 mcg Oral Daily Fay Records, MD   2,000 mcg at 10/10/14 3009    PE: Constitutional: She appears well-developed and well-nourished. Pleasant, NAD. HEENT: Normal.  Head: Normocephalic and atraumatic.  Neck: Neck supple. No lymphadenopathy. Cardiovascular: regularly irregular rhythm, tachycardic. No murmurs/rubs. Pulmonary/Chest: Effort normal. No respiratory distress. Rales in LLL.  Abdominal: Soft. She exhibits no distension. There is no tenderness.  Musculoskeletal: She exhibits unilateral trace LLE edema (chronic). Degenerative joint changes in bilateral hands and wrists.  Neurological: She is alert and oriented to person, place, and time.  Skin: Skin is warm and dry.   Lab Results:   Recent Labs  10/08/14 1639 10/09/14 0231  WBC 15.7* 14.5*  HGB 10.3* 9.8*  HCT 34.1* 32.8*  PLT 248 279   BMET  Recent Labs  10/08/14 1639 10/09/14 0231  NA 137  138  K 3.6 3.5  CL 99* 102  CO2 26 26  GLUCOSE 185* 274*  BUN 8 8  CREATININE 0.57 0.56  CALCIUM 8.4* 9.1   PT/INR  Recent Labs  10/08/14 1639  LABPROT 17.4*  INR 1.41   Telemetry Currently AF, rate 118.  HR was <100 for the vast majority of the night and never above 120.  One 3-beat run of NSVT, asymptomatic.   Assessment/Plan Principal Problem:   Atrial fibrillation Active Problems:   Hypertension   Hypercholesterolemia   DM2 (diabetes mellitus, type 2)   Rheumatoid arthritis   Gastroesophageal reflux disease   OA (osteoarthritis) of hip   A-fib   1. Atrial fibrillation with RVR: - 69 y/o female with a h/o persistent AF since February 2012 that was admitted yesterday after seeing PCP for cough and was found to be in rapid afib - ? Underlying cause. Mildly anemic though not profoundly more so than baseline. WBC mildly elevated in setting of chronic steroids and leukocytosis. CXR non-acute. CTA neg. TSH nl. - Was initially anticoagulated with Pradaxa but switched to Xarelto in April due to LLE DVT. She has not missed any doses. CTA neg for PE. - She has had prior issues with rate control in the summer of 2015 but this improved following addition of digoxin @ that time. - Dilt drip weaned to 5 mg/hr currently. Rate is currently tachycardic but she has not yet received her AM metoprolol.  - Stop dilt drip and begin home-dose Cardizem ER 360 mg.   - Good response to Toprol XL 100 mg BID - Continue digoxin @ current dose. - Echo showing normal EF 60-65%. LVH. PA pressure 35 mmHg.  - Persistent cough and chest-tightness may be volume related. Will increase furosemide to 60 mg daily.    2. Cough/atypical chest pain: - No pain overnight.  - Possibly related to bronchitis (recently treated for bronchitis in May).  - Per Dr. Harrington Challenger, prednisone 10 mg for 2 more days, then return to 5 mg daily.  3. Rheumatoid arthritis: - ESR 38.  - Prednisone as above.  - [place]. - No pericarditis or effusion on echo.   4. Myelodysplastic syndrome/microcytic anemia: - Chronically myelodysplastic.  - Evaluated by Dr. Burr Medico in April who felt her likelihood of bone marrow disease was low. He recommended bone marrow biopsy if her leukocytosis worsens or if she develops new blood count abnormalities. - Hgb  9.8. Progressively trending down over the last 5 months: 13.2 -> 12.2 -> 10.3 -> 9.8.   LOS: 2 days   Hervey Ard, NP student   10/10/2014 9:05 AM  Pt. Seen and examined. Agree with the NP/PA-C note as written.  Please see my note for additional details. Plan to d/c home today. Will need to resume metformin on d/c. Will need follow-up with PCP for blood sugar control while on steroids.  Pixie Casino, MD, [facility] Attending Cardiologist [place]

## 2014-10-10 NOTE — Progress Notes (Addendum)
DAILY PROGRESS NOTE  Subjective:  No events overnight. Echo yesterday shows LVEF 60-65%, borderline elevated pulmonary pressures.  Objective:  Temp:  [97.8 F (36.6 C)-98.4 F (36.9 C)] 97.9 F (36.6 C) (07/08 0756) Pulse Rate:  [80-118] 80 (07/08 0756) Resp:  [18-20] 18 (07/08 0756) BP: (114-140)/(63-94) 138/86 mmHg (07/08 0756) SpO2:  [97 %-100 %] 99 % (07/08 0756) Weight:  [162 lb 12.8 oz (73.846 kg)] 162 lb 12.8 oz (73.846 kg) (07/08 0517) Weight change: -6 lb 4.8 oz (-2.858 kg)  Intake/Output from previous day: 07/07 0701 - 07/08 0700 In: 840 [P.O.:840] Out: 1150 [Urine:1150]  Intake/Output from this shift: Total I/O In: 240 [P.O.:240] Out: 350 [Urine:350]  Medications: Current Facility-Administered Medications  Medication Dose Route Frequency Provider Last Rate Last Dose  . 0.9 %  sodium chloride infusion  250 mL Intravenous PRN Fay Records, MD      . acetaminophen (TYLENOL) tablet 650 mg  650 mg Oral Q4H PRN Fay Records, MD   650 mg at 10/08/14 2325  . buPROPion [facility] SR) 12 hr tablet 150 mg  150 mg Oral BID Fay Records, MD   150 mg at 10/10/14 0811  . celecoxib (CELEBREX) capsule 100 mg  100 mg Oral BID Fay Records, MD   100 mg at 10/10/14 0810  . cholecalciferol (VITAMIN D) tablet 2,000 Units  2,000 Units Oral Daily Fay Records, MD   2,000 Units at 10/10/14 [phone]  . digoxin (LANOXIN) tablet 0.125 mg  0.125 mg Oral Daily Fay Records, MD   0.125 mg at 10/10/14 0810  . diltiazem (CARDIZEM) 100 mg in dextrose 5 % 100 mL (1 mg/mL) infusion  5-15 mg/hr Intravenous Titrated Fay Records, MD 5 mL/hr at 10/10/14 0312 5 mg/hr at 10/10/14 0312  . furosemide (LASIX) tablet 40 mg  40 mg Oral Daily Fay Records, MD   40 mg at 10/10/14 0810  . insulin aspart (novoLOG) injection 0-5 Units  0-5 Units Subcutaneous QHS Fay Records, MD   4 Units at 10/08/14 2328  . insulin aspart (novoLOG) injection 0-9 Units  0-9 Units Subcutaneous TID WC Fay Records, MD   7 Units at  10/10/14 1204  . leflunomide (ARAVA) tablet 20 mg  20 mg Oral Daily Fay Records, MD   20 mg at 10/10/14 [phone]  . metoprolol succinate (TOPROL-XL) 24 hr tablet 100 mg  100 mg Oral BID Rogelia Mire, NP   100 mg at 10/10/14 0811  . nitroGLYCERIN (NITROSTAT) SL tablet 0.4 mg  0.4 mg Sublingual Q5 min PRN Fay Records, MD      . omega-3 acid ethyl esters (LOVAZA) capsule 1 g  1 g Oral BID Fay Records, MD   1 g at 10/10/14 [phone]  . ondansetron (ZOFRAN) injection 4 mg  4 mg Intravenous Q6H PRN Fay Records, MD      . pantoprazole (PROTONIX) EC tablet 40 mg  40 mg Oral Daily Fay Records, MD   40 mg at 10/10/14 0811  . potassium chloride (K-DUR) CR tablet 20 mEq  20 mEq Oral BID Fay Records, MD   20 mEq at 10/10/14 0810  . predniSONE (DELTASONE) tablet 15 mg  15 mg Oral Q breakfast Fay Records, MD   15 mg at 10/10/14 0810  . rivaroxaban (XARELTO) tablet 20 mg  20 mg Oral Q supper Fay Records, MD   20 mg at 10/09/14 1729  .  sodium chloride 0.9 % injection 3 mL  3 mL Intravenous Q12H Fay Records, MD   3 mL at 10/09/14 2200  . sodium chloride 0.9 % injection 3 mL  3 mL Intravenous PRN Fay Records, MD      . vitamin B-12 (CYANOCOBALAMIN) tablet 2,000 mcg  2,000 mcg Oral Daily Fay Records, MD   2,000 mcg at 10/10/14 5035    Physical Exam: General appearance: alert and no distress Lungs: clear to auscultation bilaterally Heart: irregularly irregular, tachycardic Extremities: extremities normal, atraumatic, no cyanosis or edema Pulses: 2+ and symmetric  Lab Results: Results for orders placed or performed during the hospital encounter of 10/08/14 (from the past 48 hour(s))  CBC with Differential/Platelet     Status: Abnormal   Collection Time: 10/08/14  4:39 PM  Result Value Ref Range   WBC 15.7 (H) 4.0 - 10.5 K/uL   RBC 4.63 3.87 - 5.11 MIL/uL   Hemoglobin 10.3 (L) 12.0 - 15.0 g/dL   HCT 34.1 (L) 36.0 - 46.0 %   MCV 73.7 (L) 78.0 - 100.0 fL   MCH 22.2 (L) 26.0 - 34.0 pg   MCHC 30.2  30.0 - 36.0 g/dL   RDW 17.3 (H) 11.5 - 15.5 %   Platelets 248 150 - 400 K/uL   Neutrophils Relative % 69 43 - 77 %   Lymphocytes Relative 20 12 - 46 %   Monocytes Relative 10 3 - 12 %   Eosinophils Relative 1 0 - 5 %   Basophils Relative 0 0 - 1 %   Neutro Abs 10.8 (H) 1.7 - 7.7 K/uL   Lymphs Abs 3.1 0.7 - 4.0 K/uL   Monocytes Absolute 1.6 (H) 0.1 - 1.0 K/uL   Eosinophils Absolute 0.2 0.0 - 0.7 K/uL   Basophils Absolute 0.0 0.0 - 0.1 K/uL   RBC Morphology POLYCHROMASIA PRESENT    WBC Morphology ATYPICAL LYMPHOCYTES     Comment: TOXIC GRANULATION  Basic metabolic panel     Status: Abnormal   Collection Time: 10/08/14  4:39 PM  Result Value Ref Range   Sodium 137 135 - 145 mmol/L   Potassium 3.6 3.5 - 5.1 mmol/L   Chloride 99 (L) 101 - 111 mmol/L   CO2 26 22 - 32 mmol/L   Glucose, Bld 185 (H) 65 - 99 mg/dL   BUN 8 6 - 20 mg/dL   Creatinine, Ser 0.57 0.44 - 1.00 mg/dL   Calcium 8.4 (L) 8.9 - 10.3 mg/dL   GFR calc non Af Amer >60 >60 mL/min   GFR calc Af Amer >60 >60 mL/min    Comment: (NOTE) The eGFR has been calculated using the CKD EPI equation. This calculation has not been validated in all clinical situations. eGFR's persistently <60 mL/min signify possible Chronic Kidney Disease.    Anion gap 12 5 - 15  APTT     Status: None   Collection Time: 10/08/14  4:39 PM  Result Value Ref Range   aPTT 32 24 - 37 seconds  Protime-INR     Status: Abnormal   Collection Time: 10/08/14  4:39 PM  Result Value Ref Range   Prothrombin Time 17.4 (H) 11.6 - 15.2 seconds   INR 1.41 0.00 - 1.49  Troponin I     Status: Abnormal   Collection Time: 10/08/14  4:39 PM  Result Value Ref Range   Troponin I 0.04 (H) <0.031 ng/mL    Comment:        PERSISTENTLY INCREASED  TROPONIN VALUES IN THE RANGE OF 0.04-0.49 ng/mL CAN BE SEEN IN:       -UNSTABLE ANGINA       -CONGESTIVE HEART FAILURE       -MYOCARDITIS       -CHEST TRAUMA       -ARRYHTHMIAS       -LATE PRESENTING MYOCARDIAL  INFARCTION       -COPD   CLINICAL FOLLOW-UP RECOMMENDED.   Brain natriuretic peptide     Status: Abnormal   Collection Time: 10/08/14  4:39 PM  Result Value Ref Range   B Natriuretic Peptide 226.8 (H) 0.0 - 100.0 pg/mL  Troponin I     Status: None   Collection Time: 10/08/14  8:23 PM  Result Value Ref Range   Troponin I <0.03 <0.031 ng/mL    Comment:        NO INDICATION OF MYOCARDIAL INJURY.   Digoxin level     Status: Abnormal   Collection Time: 10/08/14  8:23 PM  Result Value Ref Range   Digoxin Level 0.3 (L) 0.8 - 2.0 ng/mL  Glucose, capillary     Status: Abnormal   Collection Time: 10/08/14 11:03 PM  Result Value Ref Range   Glucose-Capillary 309 (H) 65 - 99 mg/dL  TSH     Status: None   Collection Time: 10/08/14 11:35 PM  Result Value Ref Range   TSH 0.784 0.350 - 4.500 uIU/mL  Sedimentation rate     Status: Abnormal   Collection Time: 10/08/14 11:35 PM  Result Value Ref Range   Sed Rate 38 (H) 0 - 22 mm/hr  Urinalysis, Routine w reflex microscopic (not at [facility])     Status: Abnormal   Collection Time: 10/09/14 12:40 AM  Result Value Ref Range   Color, Urine YELLOW YELLOW   APPearance CLEAR CLEAR   Specific Gravity, Urine 1.030 1.005 - 1.030   pH 6.0 5.0 - 8.0   Glucose, UA 100 (A) NEGATIVE mg/dL   Hgb urine dipstick NEGATIVE NEGATIVE   Bilirubin Urine NEGATIVE NEGATIVE   Ketones, ur NEGATIVE NEGATIVE mg/dL   Protein, ur 30 (A) NEGATIVE mg/dL   Urobilinogen, UA 1.0 0.0 - 1.0 mg/dL   Nitrite NEGATIVE NEGATIVE   Leukocytes, UA NEGATIVE NEGATIVE  Urine microscopic-add on     Status: Abnormal   Collection Time: 10/09/14 12:40 AM  Result Value Ref Range   Squamous Epithelial / LPF FEW (A) RARE   WBC, UA 0-2 <3 WBC/hpf   RBC / HPF 0-2 <3 RBC/hpf   Bacteria, UA FEW (A) RARE  Comprehensive metabolic panel     Status: Abnormal   Collection Time: 10/09/14  2:31 AM  Result Value Ref Range   Sodium 138 135 - 145 mmol/L   Potassium 3.5 3.5 - 5.1 mmol/L   Chloride  102 101 - 111 mmol/L   CO2 26 22 - 32 mmol/L   Glucose, Bld 274 (H) 65 - 99 mg/dL   BUN 8 6 - 20 mg/dL   Creatinine, Ser 0.56 0.44 - 1.00 mg/dL   Calcium 9.1 8.9 - 10.3 mg/dL   Total Protein 7.2 6.5 - 8.1 g/dL   Albumin 3.1 (L) 3.5 - 5.0 g/dL   AST 16 15 - 41 U/L   ALT 18 14 - 54 U/L   Alkaline Phosphatase 104 38 - 126 U/L   Total Bilirubin 1.0 0.3 - 1.2 mg/dL   GFR calc non Af Amer >60 >60 mL/min   GFR calc Af Amer >60 >60  mL/min    Comment: (NOTE) The eGFR has been calculated using the CKD EPI equation. This calculation has not been validated in all clinical situations. eGFR's persistently <60 mL/min signify possible Chronic Kidney Disease.    Anion gap 10 5 - 15  Digoxin level     Status: Abnormal   Collection Time: 10/09/14  2:31 AM  Result Value Ref Range   Digoxin Level 0.3 (L) 0.8 - 2.0 ng/mL  CBC with Differential/Platelet     Status: Abnormal   Collection Time: 10/09/14  2:31 AM  Result Value Ref Range   WBC 14.5 (H) 4.0 - 10.5 K/uL   RBC 4.49 3.87 - 5.11 MIL/uL   Hemoglobin 9.8 (L) 12.0 - 15.0 g/dL   HCT 32.8 (L) 36.0 - 46.0 %   MCV 73.1 (L) 78.0 - 100.0 fL   MCH 21.8 (L) 26.0 - 34.0 pg   MCHC 29.9 (L) 30.0 - 36.0 g/dL   RDW 17.5 (H) 11.5 - 15.5 %   Platelets 279 150 - 400 K/uL   Neutrophils Relative % 82 (H) 43 - 77 %   Lymphocytes Relative 9 (L) 12 - 46 %   Monocytes Relative 8 3 - 12 %   Eosinophils Relative 1 0 - 5 %   Basophils Relative 0 0 - 1 %   Neutro Abs 11.9 (H) 1.7 - 7.7 K/uL   Lymphs Abs 1.3 0.7 - 4.0 K/uL   Monocytes Absolute 1.2 (H) 0.1 - 1.0 K/uL   Eosinophils Absolute 0.1 0.0 - 0.7 K/uL   Basophils Absolute 0.0 0.0 - 0.1 K/uL   RBC Morphology ELLIPTOCYTES     Comment: POLYCHROMASIA PRESENT  Glucose, capillary     Status: Abnormal   Collection Time: 10/09/14  7:38 AM  Result Value Ref Range   Glucose-Capillary 335 (H) 65 - 99 mg/dL  Glucose, capillary     Status: Abnormal   Collection Time: 10/09/14 11:09 AM  Result Value Ref Range    Glucose-Capillary 317 (H) 65 - 99 mg/dL  Glucose, capillary     Status: Abnormal   Collection Time: 10/09/14  4:09 PM  Result Value Ref Range   Glucose-Capillary 386 (H) 65 - 99 mg/dL  Glucose, capillary     Status: Abnormal   Collection Time: 10/09/14  9:02 PM  Result Value Ref Range   Glucose-Capillary 147 (H) 65 - 99 mg/dL  Glucose, capillary     Status: Abnormal   Collection Time: 10/10/14  7:22 AM  Result Value Ref Range   Glucose-Capillary 191 (H) 65 - 99 mg/dL    Imaging: Dg Chest 2 View  10/08/2014   CLINICAL DATA:  Initial encounter for chest pain and tightness since last night. Dry cough since yesterday.  EXAM: CHEST  2 VIEW  COMPARISON:  11/29/2013.  FINDINGS: The lungs are clear without focal infiltrate, edema, pneumothorax or pleural effusion. Interstitial markings are diffusely coarsened with chronic features. Cardiopericardial silhouette is at upper limits of normal for size. Old left fourth rib fracture again noted.  IMPRESSION: Stable.  No acute cardiopulmonary findings.   Electronically Signed   By: Misty Stanley M.D.   On: 10/08/2014 16:53   Ct Angio Chest Pe W/cm &/or Wo Cm  10/08/2014   CLINICAL DATA:  Chest pain radiating into the back and between the shoulder blades beginning 10/07/2014. No known injury. Initial encounter.  EXAM: CT ANGIOGRAPHY CHEST WITH CONTRAST  TECHNIQUE: Multidetector CT imaging of the chest was performed using the standard protocol during bolus  administration of intravenous contrast. Multiplanar CT image reconstructions and MIPs were obtained to evaluate the vascular anatomy.  CONTRAST:  100 mL OMNIPAQUE IOHEXOL 350 MG/ML SOLN  COMPARISON:  PA and lateral chest this same day.  FINDINGS: No pulmonary embolus is identified. Bovine type aortic arch is incidentally noted. Scattered calcific aortic and coronary atherosclerosis is noted. Heart size is upper normal. No pleural or pericardial effusion. No axillary, hilar or mediastinal lymphadenopathy. The  lungs show only mild dependent atelectatic change. Visualized upper abdomen is unremarkable. No focal bony abnormality is identified. Mild appearing thoracic spondylosis is noted.  Review of the MIP images confirms the above findings.  IMPRESSION: Negative for pulmonary embolus. No acute abnormality or finding to explain the patient's symptoms.  Scattered aortic and coronary atherosclerosis.   Electronically Signed   By: Inge Rise M.D.   On: 10/08/2014 18:52    Assessment:  1. Principal Problem: 2.   Atrial fibrillation 3. Active Problems: 4.   Hypertension 5.   Hypercholesterolemia 6.   DM2 (diabetes mellitus, type 2) 7.   Rheumatoid arthritis 8.   Gastroesophageal reflux disease 9.   OA (osteoarthritis) of hip 10.   A-fib 11.   Plan:  1. Feels well and wants to go home. Echo yesterday shows normal LV function. HR hovering around 100. Will transition back to po cardizem. Increase metoprolol XL to 150 mg BID. Continue xarelto. Follow-up with Dr. Mare Ferrari after discharge. [place] for d/c home today.  Time Spent Directly with Patient:  15 minutes  Length of Stay:  LOS: 2 days   Pixie Casino, MD, [facility] Attending Cardiologist [place] 10/10/2014, 12:08 PM

## 2014-10-10 NOTE — Discharge Summary (Signed)
Discharge Summary   Patient ID: [name],  MRN: [id number], DOB/AGE: 1945-08-08 69 y.o.  Admit date: 10/08/2014 Discharge date: 10/10/2014  Primary Care Provider: Bing Matter Primary Cardiologist: Dr. Mare Ferrari  Discharge Diagnoses Principal Problem:   Atrial fibrillation Active Problems:   Hypertension   Hypercholesterolemia   DM2 (diabetes mellitus, type 2)   Rheumatoid arthritis   Gastroesophageal reflux disease   OA (osteoarthritis) of hip   A-fib   Allergies No Known Allergies  Procedures  Echocardiogram 10/09/2014 LV EF: 60% -  65%  ------------------------------------------------------------------- Indications:   Atrial fibrillation - 427.31.  ------------------------------------------------------------------- History:  PMH: Chest pain. Anxiety. Depression. Risk factors: Hypertension. Diabetes mellitus. Dyslipidemia.  ------------------------------------------------------------------- Study Conclusions  - Left ventricle: The cavity size was normal. Wall thickness was increased in a pattern of mild LVH. Systolic function was normal. The estimated ejection fraction was in the range of 60% to 65%. - Mitral valve: Calcified annulus. Mildly thickened leaflets . - Atrial septum: No defect or patent foramen ovale was identified. - Pulmonary arteries: PA peak pressure: 35 mm Hg (S).    Hospital Course  The patient is a 69 year old female with PMH of HTN, HLD, DM, obesity and persistent atrial fibrillation on chronic Xarelto. Digoxin was added in the summer due to the increased heart rate. Patient [facility] 10[facility]08/2014 for worsening shortness of breath when laying down. She also complained of epigastric pain radiating to the spine. She initially sought medical attention with her PCP for cough who noted she was in a-fib with RVR referred her to the ED for further evaluation. While in the ED, she was in rapid atrial fibrillation  with heart rate of 110 to 120s. Her chest pain was felt to be pleuritic and worse with cough. CTA was negative for PE. Labs were significant elevated white blood cell count. She was started on IV diltiazem. After talking with the patient's rheumalogist, she was placed on a tapering dose of prednisone for elevated ESR and rheumatoid arthritis.  Echocardiogram was ordered on 7/7 which showed EF 60-65%, PA peak pressure 35 mmHg.  She was seen in the morning of 10/10/2014, her heart rate is better controlled. IV diltiazem was stopped and the transition to Cardizem 360 mg along with increased dose of Toprol-XL 150 mg twice a day. She is deemed stable for discharge from cardiology perspective. I have arranged 2 weeks transition of care follow-up for this patient. Of note, patient is currently on tapering dose of prednisone and will require 10 mg prednisone for 2 days, and then return to 5 mg daily afterward. Of note, patient's hemoglobin has been progressively trending down over the past 5 months. She does have a history of myelodysplastic syndrome and microcytic anemia, this should be monitor in the future. She also has difficult to control blood sugar due to the need for higher dose of prednisone, she should follow-up with her PCP for blood glucose control.  Discharge Vitals Blood pressure 138/86, pulse 80, temperature 97.9 F (36.6 C), temperature source Oral, resp. rate 18, height '5\' 7"'  (1.702 m), weight 162 lb 12.8 oz (73.846 kg), SpO2 99 %.  Filed Weights   10/08/14 2252 10/10/14 0517  Weight: 169 lb 1.6 oz (76.703 kg) 162 lb 12.8 oz (73.846 kg)    Labs  CBC  Recent Labs  10/08/14 1639 10/09/14 0231  WBC 15.7* 14.5*  NEUTROABS 10.8* 11.9*  HGB 10.3* 9.8*  HCT 34.1* 32.8*  MCV 73.7* 73.1*  PLT 248 279  Basic Metabolic Panel  Recent Labs  10/08/14 1639 10/09/14 0231  NA 137 138  K 3.6 3.5  CL 99* 102  CO2 26 26  GLUCOSE 185* 274*  BUN 8 8  CREATININE 0.57 0.56  CALCIUM 8.4* 9.1     Liver Function Tests  Recent Labs  10/09/14 0231  AST 16  ALT 18  ALKPHOS 104  BILITOT 1.0  PROT 7.2  ALBUMIN 3.1*   Cardiac Enzymes  Recent Labs  10/08/14 1639 10/08/14 2023  TROPONINI 0.04* <0.03   Thyroid Function Tests  Recent Labs  10/08/14 2335  TSH 0.784    Disposition  Pt is being discharged home today in good condition.  Follow-up Plans & Appointments      Follow-up Information    Follow up with Rosaria Ferries, PA-C On 10/23/2014.   Specialties:  Cardiology, Radiology   Why:  '@10' :00am   Contact information:   [address] [place] [address] [phone]       Follow up with KAPLAN,KRISTEN, PA-C.   Specialty:  Family Medicine   Why:  Please followup with your primary care physician regarding management of your blood sugar   Contact information:   [place] [address] [phone]       Please follow up.   Why:  Please continue to followup with your rheumatologist for your rheumatoid arthritis and steroid dosing      Discharge Medications    Medication List    TAKE these medications        buPROPion 150 MG 12 hr tablet  Commonly known as:  WELLBUTRIN SR  Take 150 mg by mouth 2 (two) times daily.     celecoxib 100 MG capsule  Commonly known as:  CELEBREX  Take 100 mg by mouth 2 (two) times daily.     digoxin 0.125 MG tablet  Commonly known as:  LANOXIN  TAKE 1 TABLET (0.125 MG TOTAL) BY MOUTH EVERY MORNING.     diltiazem 360 MG 24 hr capsule  Commonly known as:  CARDIZEM CD  Take 1 capsule (360 mg total) by mouth daily.     esomeprazole 40 MG capsule  Commonly known as:  NEXIUM  Take 40 mg by mouth daily.     furosemide 40 MG tablet  Commonly known as:  LASIX  TAKE 1 TABLET (40 MG TOTAL) BY MOUTH DAILY.     KLOR-CON 10 10 MEQ tablet  Generic drug:  potassium chloride  TAKE 2 TABLETS BY MOUTH TWICE A DAY     leflunomide 20 MG tablet  Commonly known as:  ARAVA  Take 20 mg by mouth  daily.     metFORMIN 500 MG 24 hr tablet  Commonly known as:  GLUCOPHAGE-XR  Take 1,000 mg by mouth 2 (two) times daily.     metoprolol succinate 50 MG 24 hr tablet  Commonly known as:  TOPROL-XL  Take 3 tablets (150 mg total) by mouth 2 (two) times daily.     nitroGLYCERIN 0.4 MG SL tablet  Commonly known as:  NITROSTAT  Place 0.4 mg under the tongue every 5 (five) minutes as needed for chest pain (chest pain).     Omega 3 1200 MG Caps  Take 1 capsule by mouth 2 (two) times daily.     ORENCIA 125 MG/ML Sosy  Generic drug:  Abatacept  Inject 125 mg into the skin once a week.     predniSONE 5 MG tablet  Commonly known  as:  DELTASONE  Take 1 tablet (5 mg total) by mouth daily. Take 2 tablet (67m) daily for 2 days before resume 540mdaily thereafter     rivaroxaban 20 MG Tabs tablet  Commonly known as:  XARELTO  Take 1 tablet (20 mg total) by mouth daily with supper.     VITAMIN B 12 PO  Take 2,000 mg by mouth daily.     Vitamin D3 2000 UNITS Tabs  Take 1 tablet by mouth daily.        Duration of Discharge Encounter   Greater than 30 minutes including physician time.  SiHilbert CorriganA-C Pager: [phone]/11/2014, 1:04 PM

## 2014-10-13 ENCOUNTER — Telehealth: Payer: Self-pay | Admitting: Hematology

## 2014-10-13 NOTE — Telephone Encounter (Signed)
Patient contacted regarding discharge from [facility] on October 10, 2014.[name]  Patient understands to follow up with provider Richardson Dopp, PA on October 24, 2014 at 8:30AM at [facility]. Patient understands discharge instructions? yes Patient understands medications and regiment? yes Patient understands to bring all medications to this visit? yes

## 2014-10-13 NOTE — Telephone Encounter (Signed)
pt cld to CX appt stating shes beeni n hospital w/ heart condition and not up to coming in will call back to r/s

## 2014-10-14 ENCOUNTER — Other Ambulatory Visit: Payer: Medicare Other

## 2014-10-14 ENCOUNTER — Ambulatory Visit: Payer: Medicare Other | Admitting: Hematology

## 2014-10-22 NOTE — Progress Notes (Signed)
Cardiology Office Note   Date:  10/24/2014  98h m  ID:  [name], DOB [birth year], MRN [id number]   Patient Care Team: Aletha Halim, PA-C as PCP - General (Family Medicine) Darlin Coco, MD as Consulting Physician (Cardiology) Leigh Aurora, MD as Consulting Physician (Rheumatology) Hayden Pedro, MD as Consulting Physician (Ophthalmology)      Chief Complaint  Patient presents with  . Hospitalization Follow-up    admit for AFib with RVR     History of Present Illness: [name] is a 69 y.o. female with a hx of chronic AFib, HTN, DM, HL, obesity, myelodysplastic syndrome. She is on Xarelto for [facility] [facility] [employer]. Last seen by Dr. Darlin Coco in 09/2014.   Admitted 7/6-7/8 with AF with RVR. She was rate controlled with IV Diltiazem.  She was transitioned to oral medications with adjustments in the dosages.  She was noted to have an elevated ESR.  She has a hx of RA and was placed on a prednisone taper. She returns for FU.    Since DC, she has been doing ok.  She is here alone today.  She denies chest pain, dyspnea, syncope.  No orthopnea, PND, edema.  She denies any bleeding.  No melena or hematochezia.  No hematuria.  Her sugars run 170-200s.  She does have a NP cough in the evenings. She has occasional fatigue.     Studies/Reports Reviewed Today:  Echo 10/09/14 Mild LVH, EF 60-65% MAC PASP 35 mmHg  Chest CTA 10/08/14 IMPRESSION: Negative for pulmonary embolus. No acute abnormality or finding to explain the patient's symptoms.  Scattered aortic and coronary atherosclerosis.  Echo (1/13):  Left ventricle: The cavity size was normal. There was mild concentric hypertrophy. Systolic function was vigorous. The estimated ejection fraction was in the range of 65% to 70%. Wall motion was normal; there were no regional wall motion Abnormalities.  Nuclear (3/12):  Probably normal stress nuclear study. There is a small reversible defect in the anterolateral wall which  appears to be due to shifting breast attenuation; however cannot exclude mild ischemia. EF 72%  Past Medical History  Diagnosis Date  . Chest pain   . Atrial fibrillation   . Anemia     hx of  . Hypertension   . Rheumatoid arthritis(714.0)   . Gastroesophageal reflux disease   . Hyperlipidemia   . Hepatitis 1970  . Jaundice     age 60  . Actinic keratosis   . Atrial fibrillation   . Carpal tunnel syndrome   . Edema   . Hiatal hernia   . Memory loss   . Thyroid disease     multi nodular goiter  . Type II diabetes mellitus   . Anxiety   . Depression     Past Surgical History  Procedure Laterality Date  . Hand surgery  1995 and 1996    artificial joints both hands  . Cataract extraction Left   . Total hip arthroplasty Left 2009  . Total hip arthroplasty Right 04/03/2013    Procedure: RIGHT TOTAL HIP ARTHROPLASTY ANTERIOR APPROACH;  Surgeon: Gearlean Alf, MD;  Location: [facility];  Service: Orthopedics;  Laterality: Right;  . Joint replacement       Current Outpatient Prescriptions  Medication Sig Dispense Refill  . buPROPion (WELLBUTRIN SR) 150 MG 12 hr tablet Take 150 mg by mouth 2 (two) times daily.      . celecoxib (CELEBREX) 100 MG capsule Take 100 mg by mouth 2 (two)  times daily.    . Cholecalciferol (VITAMIN D3) 2000 UNITS TABS Take 1 tablet by mouth daily.    . Cyanocobalamin (VITAMIN B 12 PO) Take 2,000 mg by mouth daily.    . digoxin (LANOXIN) 0.125 MG tablet TAKE 1 TABLET (0.125 MG TOTAL) BY MOUTH EVERY MORNING. 90 tablet 3  . diltiazem (CARDIZEM CD) 360 MG 24 hr capsule Take 1 capsule (360 mg total) by mouth daily. 90 capsule 2  . esomeprazole (NEXIUM) 40 MG capsule Take 40 mg by mouth daily.      . furosemide (LASIX) 40 MG tablet TAKE 1 TABLET (40 MG TOTAL) BY MOUTH DAILY. 90 tablet 0  . KLOR-CON 10 10 MEQ tablet TAKE 2 TABLETS BY MOUTH TWICE A DAY 120 tablet 3  . leflunomide (ARAVA) 20 MG tablet Take 20 mg by mouth daily.     . metFORMIN (GLUCOPHAGE-XR)  500 MG 24 hr tablet Take 1,000 mg by mouth 2 (two) times daily.  1  . metoprolol succinate (TOPROL-XL) 100 MG 24 hr tablet Take 2 tablets (200 mg total) by mouth 2 (two) times daily.    . nitroGLYCERIN (NITROSTAT) 0.4 MG SL tablet Place 0.4 mg under the tongue every 5 (five) minutes as needed for chest pain (chest pain).    . Omega 3 1200 MG CAPS Take 1 capsule by mouth 2 (two) times daily.    . predniSONE (DELTASONE) 5 MG tablet Take 1 tablet (5 mg total) by mouth daily. Take 2 tablet (11m) daily for 2 days before resume 539mdaily thereafter 30 tablet 0  . rivaroxaban (XARELTO) 20 MG TABS tablet Take 1 tablet (20 mg total) by mouth daily with supper. 30 tablet 3   No current facility-administered medications for this visit.    Allergies:   Review of patient's allergies indicates no known allergies.    Social History:  The patient  reports that she has never smoked. She has never used smokeless tobacco. She reports that she does not drink alcohol or use illicit drugs.   Family History:  The patient's family history includes Heart failure in her sister; Hypertension in her sister; Stroke in her father. There is no history of Heart attack.    ROS:   Please see the history of present illness.   Review of Systems  Constitution: Positive for diaphoresis.  Cardiovascular: Positive for irregular heartbeat.  Respiratory: Positive for cough.   Psychiatric/Behavioral: Positive for depression.  All other systems reviewed and are negative.     PHYSICAL EXAM: VS:  BP 138/90 mmHg  Pulse 102  Wt 165 lb 12.8 oz (75.206 kg)    Wt Readings from Last 3 Encounters:  10/24/14 165 lb 12.8 oz (75.206 kg)  10/10/14 162 lb 12.8 oz (73.846 kg)  09/22/14 167 lb 1.9 oz (75.805 kg)     GEN: Well nourished, well developed, in no acute distress HEENT: normal Neck: no JVD, no masses Cardiac:  Normal S1/S2, irregularly irregular rhythm; no murmur, no rubs or gallops, trace bilateral LE edema     Respiratory:  clear to auscultation bilaterally, no wheezing, rhonchi or rales. GI: soft, nontender, nondistended, + BS MS: no deformity or atrophy Skin: warm and dry  Neuro:  CNs II-XII intact, Strength and sensation are intact Psych: Normal affect   EKG:  EKG is ordered today.  It demonstrates:   AFib, HR 100, normal axis, inferolateral TWI, no change from prior tracings   Recent Labs: 10/08/2014: B Natriuretic Peptide 226.8*; TSH 0.784 10/09/2014: ALT 18;  BUN 8; Creatinine, Ser 0.56; Hemoglobin 9.8*; Platelets 279; Potassium 3.5; Sodium 138    Lipid Panel    Component Value Date/Time   CHOL 195 09/06/2012 0954   TRIG 309.0* 09/06/2012 0954   HDL 30.70* 09/06/2012 0954   CHOLHDL 6 09/06/2012 0954   VLDL 61.8* 09/06/2012 0954   LDLDIRECT 117.3 09/06/2012 0954      ASSESSMENT AND PLAN:  Chronic atrial fibrillation:  Rate remains elevated.  Etiology for this is not clear.  Question if anemia is worsening.  TSH in the hospital was ok.  RA flare may contribute. Will increase Toprol-XL to 200 mg Twice daily.  Continue Xarelto.  FU with Dr. Darlin Coco in 1 month.  If HR remains elevated, question if we should consider Amiodarone for rate control.  Will leave this up to Dr. Mare Ferrari.   Essential hypertension:  Controlled.   Rheumatoid arthritis:  FU with Rheumatology as planned.   Anemia, unspecified anemia type:  Repeat CBC today.      Medication Changes: Current medicines are reviewed at length with the patient today.  Concerns regarding medicines are as outlined above.  The following changes have been made:   Discontinued Medications   ORENCIA 125 MG/ML SOSY    Inject 125 mg into the skin once a week.    Modified Medications   Modified Medication Previous Medication   METOPROLOL SUCCINATE (TOPROL-XL) 100 MG 24 HR TABLET metoprolol succinate (TOPROL-XL) 50 MG 24 hr tablet      Take 2 tablets (200 mg total) by mouth 2 (two) times daily.    Take 3 tablets (150 mg total)  by mouth 2 (two) times daily.   New Prescriptions   No medications on file     Labs/ tests ordered today include:   Orders Placed This Encounter  Procedures  . CBC w/Diff  . EKG 12-Lead     Disposition:   FU with Dr. Darlin Coco 4 weeks    Signed, Versie Starks, MHS 10/24/2014 9:33 AM    [facility] [place], [place], [place]  [address] Phone: [phone]; Fax: [phone]

## 2014-10-23 ENCOUNTER — Encounter: Payer: Medicare Other | Admitting: Physician Assistant

## 2014-10-24 ENCOUNTER — Encounter: Payer: Self-pay | Admitting: Physician Assistant

## 2014-10-24 ENCOUNTER — Telehealth: Payer: Self-pay | Admitting: *Deleted

## 2014-10-24 ENCOUNTER — Ambulatory Visit (INDEPENDENT_AMBULATORY_CARE_PROVIDER_SITE_OTHER): Payer: Medicare Other | Admitting: Physician Assistant

## 2014-10-24 VITALS — BP 138/90 | HR 102 | Wt 165.8 lb

## 2014-10-24 DIAGNOSIS — I10 Essential (primary) hypertension: Secondary | ICD-10-CM

## 2014-10-24 DIAGNOSIS — E7800 Pure hypercholesterolemia, unspecified: Secondary | ICD-10-CM

## 2014-10-24 DIAGNOSIS — M069 Rheumatoid arthritis, unspecified: Secondary | ICD-10-CM

## 2014-10-24 DIAGNOSIS — I482 Chronic atrial fibrillation, unspecified: Secondary | ICD-10-CM

## 2014-10-24 DIAGNOSIS — D649 Anemia, unspecified: Secondary | ICD-10-CM | POA: Diagnosis not present

## 2014-10-24 LAB — CBC WITH DIFFERENTIAL/PLATELET
Basophils Absolute: 0.1 10*3/uL (ref 0.0–0.1)
Basophils Relative: 0.6 % (ref 0.0–3.0)
Eosinophils Absolute: 0.4 10*3/uL (ref 0.0–0.7)
Eosinophils Relative: 3.2 % (ref 0.0–5.0)
HEMATOCRIT: 35.5 % — AB (ref 36.0–46.0)
Hemoglobin: 10.9 g/dL — ABNORMAL LOW (ref 12.0–15.0)
Lymphocytes Relative: 17.2 % (ref 12.0–46.0)
Lymphs Abs: 2.2 10*3/uL (ref 0.7–4.0)
MCHC: 30.8 g/dL (ref 30.0–36.0)
MCV: 69.7 fl — ABNORMAL LOW (ref 78.0–100.0)
MONO ABS: 1.2 10*3/uL — AB (ref 0.1–1.0)
Monocytes Relative: 9.3 % (ref 3.0–12.0)
NEUTROS PCT: 69.7 % (ref 43.0–77.0)
Neutro Abs: 8.8 10*3/uL — ABNORMAL HIGH (ref 1.4–7.7)
PLATELETS: 395 10*3/uL (ref 150.0–400.0)
RBC: 5.1 Mil/uL (ref 3.87–5.11)
RDW: 19.1 % — AB (ref 11.5–15.5)
WBC: 12.6 10*3/uL — AB (ref 4.0–10.5)

## 2014-10-24 MED ORDER — METOPROLOL SUCCINATE ER 100 MG PO TB24: 200.0000 mg | ORAL_TABLET | Freq: Two times a day (BID) | ORAL | Status: DC

## 2014-10-24 NOTE — Telephone Encounter (Signed)
Pt notified of lab results; f/u 8/1 w/PCP about anemia. Will fax results to PCP today as well as to pt herself. Pt verbalized understanding to results given today by phone.

## 2014-10-24 NOTE — Patient Instructions (Addendum)
Medication Instructions:  1. INCREASE TOPROL XL TO 200 MG TWICE DAILY  Labwork: TODAY CBC W/DIFF   Testing/Procedures: NONE  Follow-Up: 4-6 WEEKS WITH DR. Mare Ferrari; I WILL HAVE DR. BRACKBILL'S NURSE CALL YOU WITH AN APPT  Any Other Special Instructions Will Be Listed Below (If Applicable).

## 2014-11-10 ENCOUNTER — Other Ambulatory Visit: Payer: Self-pay | Admitting: Cardiology

## 2014-11-17 ENCOUNTER — Ambulatory Visit (INDEPENDENT_AMBULATORY_CARE_PROVIDER_SITE_OTHER): Payer: Medicare Other | Admitting: Ophthalmology

## 2014-11-24 ENCOUNTER — Other Ambulatory Visit: Payer: Self-pay | Admitting: Cardiology

## 2014-11-24 DIAGNOSIS — I482 Chronic atrial fibrillation, unspecified: Secondary | ICD-10-CM

## 2014-11-24 MED ORDER — METOPROLOL SUCCINATE ER 100 MG PO TB24: 200.0000 mg | ORAL_TABLET | Freq: Two times a day (BID) | ORAL | Status: DC

## 2014-12-04 ENCOUNTER — Ambulatory Visit (INDEPENDENT_AMBULATORY_CARE_PROVIDER_SITE_OTHER): Payer: Medicare Other | Admitting: Cardiology

## 2014-12-04 ENCOUNTER — Encounter: Payer: Self-pay | Admitting: Cardiology

## 2014-12-04 VITALS — BP 126/82 | HR 71 | Ht 67.0 in | Wt 162.8 lb

## 2014-12-04 DIAGNOSIS — I482 Chronic atrial fibrillation, unspecified: Secondary | ICD-10-CM

## 2014-12-04 NOTE — Progress Notes (Signed)
Cardiology Office Note   Date:  12/04/2014   ID:  [name], DOB January 07, 1946, MRN [id number]  PCP:  Tula Nakayama  Cardiologist: Darlin Coco MD  No chief complaint on file.     History of Present Illness: [name] is a 69 y.o. female who presents for four-month follow-up office visit.  This 69 year old woman has HTN, DM, obesity, HLD and atrial fib. She is on chronic anticoagulation. She is on Xarelto 20 mg daily. Last echo was in January of 2013 and showed a normal EF. Digoxin was added back in the summer due to increased heart rate. We switched her diuretic to Lasix. She has had a good response to Lasix.  Not short of breath. Her swelling has resolved. She underwent successful right hip surgery April 03 2013 Since last visit she has not been experiencing any new cardiac symptoms. She has significant rheumatoid arthritis and is on 3 medications for arthritis. Dr. Ouida Sills is her rheumatologist. She is diabetic. She is not having any hypoglycemic episodes. He has occasional edema in her ankles at the end of the day which goes down overnight. At her last visit she was having poorly controlled atrial fibrillation.  Her Toprol-XL was increased up to 200 mg twice a day.  With this additional medication, she has felt better.  Her heart rate is down down in the 70s. She states that she also was placed on glimepiride for her diabetes and her diabetes has done better than ever She mentioned that her hemoglobin recently dropped from 13-10.  No obvious GI source but she has an appointment coming up with Dr. Benson Norway.  She is on oral iron.  Past Medical History  Diagnosis Date  . Chest pain   . Atrial fibrillation   . Anemia     hx of  . Hypertension   . Rheumatoid arthritis(714.0)   . Gastroesophageal reflux disease   . Hyperlipidemia   . Hepatitis 1970  . Jaundice     age 69  . Actinic keratosis   . Atrial fibrillation   . Carpal tunnel syndrome   . Edema     . Hiatal hernia   . Memory loss   . Thyroid disease     multi nodular goiter  . Type II diabetes mellitus   . Anxiety   . Depression     Past Surgical History  Procedure Laterality Date  . Hand surgery  1995 and 1996    artificial joints both hands  . Cataract extraction Left   . Total hip arthroplasty Left 2009  . Total hip arthroplasty Right 04/03/2013    Procedure: RIGHT TOTAL HIP ARTHROPLASTY ANTERIOR APPROACH;  Surgeon: Gearlean Alf, MD;  Location: [facility];  Service: Orthopedics;  Laterality: Right;  . Joint replacement       Current Outpatient Prescriptions  Medication Sig Dispense Refill  . buPROPion (WELLBUTRIN SR) 150 MG 12 hr tablet Take 150 mg by mouth 2 (two) times daily.      . celecoxib (CELEBREX) 100 MG capsule Take 100 mg by mouth 2 (two) times daily.    . Cholecalciferol (VITAMIN D3) 2000 UNITS TABS Take 1 tablet by mouth daily.    . Cyanocobalamin (VITAMIN B 12 PO) Take 2,000 mg by mouth daily.    . digoxin (LANOXIN) 0.125 MG tablet TAKE 1 TABLET (0.125 MG TOTAL) BY MOUTH EVERY MORNING. 90 tablet 3  . diltiazem (CARDIZEM CD) 360 MG 24 hr capsule Take 1 capsule (360 mg  total) by mouth daily. 90 capsule 2  . esomeprazole (NEXIUM) 40 MG capsule Take 40 mg by mouth daily.      . furosemide (LASIX) 40 MG tablet TAKE 1 TABLET BY MOUTH EVERY DAY 90 tablet 0  . glimepiride (AMARYL) 2 MG tablet Take 2 mg by mouth every morning.  3  . KLOR-CON 10 10 MEQ tablet TAKE 2 TABLETS BY MOUTH TWICE A DAY 120 tablet 3  . leflunomide (ARAVA) 20 MG tablet Take 20 mg by mouth daily.     . metFORMIN (GLUCOPHAGE-XR) 500 MG 24 hr tablet Take 1,000 mg by mouth 2 (two) times daily.  1  . metoprolol succinate (TOPROL-XL) 100 MG 24 hr tablet Take 2 tablets (200 mg total) by mouth 2 (two) times daily. 120 tablet 1  . nitroGLYCERIN (NITROSTAT) 0.4 MG SL tablet Place 0.4 mg under the tongue every 5 (five) minutes as needed for chest pain (chest pain).    . Omega 3 1200 MG CAPS Take 1  capsule by mouth 2 (two) times daily.    . predniSONE (DELTASONE) 5 MG tablet Take 1 tablet (5 mg total) by mouth daily. Take 2 tablet (10mg ) daily for 2 days before resume 5mg  daily thereafter 30 tablet 0  . rivaroxaban (XARELTO) 20 MG TABS tablet Take 1 tablet (20 mg total) by mouth daily with supper. 30 tablet 3   No current facility-administered medications for this visit.    Allergies:   Review of patient's allergies indicates no known allergies.    Social History:  The patient  reports that she has never smoked. She has never used smokeless tobacco. She reports that she does not drink alcohol or use illicit drugs.   Family History:  The patient's family history includes Heart failure in her sister; Hypertension in her sister; Stroke in her father. There is no history of Heart attack.    ROS:  Please see the history of present illness.   Otherwise, review of systems are positive for none.   All other systems are reviewed and negative.    PHYSICAL EXAM: VS:  BP 126/82 mmHg  Pulse 71  Ht 5\' 7"  (1.702 m)  Wt 162 lb 12.8 oz (73.846 kg)  BMI 25.49 kg/m2 , BMI Body mass index is 25.49 kg/(m^2). GEN: Well nourished, well developed, in no acute distress HEENT: normal Neck: no JVD, carotid bruits, or masses Cardiac: Irregularly irregular rhythm.  no murmurs, rubs, or gallops,no edema  Respiratory:  clear to auscultation bilaterally, normal work of breathing GI: soft, nontender, nondistended, + BS MS: no deformity or atrophy Skin: warm and dry, no rash Neuro:  Strength and sensation are intact Psych: euthymic mood, full affect   EKG:  EKG is ordered today. The ekg ordered today demonstrates atrial fibrillation with controlled ventricular response at 71 bpm.  Nonspecific ST-T wave changes.   Recent Labs: 10/08/2014: B Natriuretic Peptide 226.8*; TSH 0.784 10/09/2014: ALT 18; BUN 8; Creatinine, Ser 0.56; Potassium 3.5; Sodium 138 10/24/2014: Hemoglobin 10.9*; Platelets 395.0     Lipid Panel    Component Value Date/Time   CHOL 195 09/06/2012 0954   TRIG 309.0* 09/06/2012 0954   HDL 30.70* 09/06/2012 0954   CHOLHDL 6 09/06/2012 0954   VLDL 61.8* 09/06/2012 0954   LDLDIRECT 117.3 09/06/2012 0954      Wt Readings from Last 3 Encounters:  12/04/14 162 lb 12.8 oz (73.846 kg)  10/24/14 165 lb 12.8 oz (75.206 kg)  10/10/14 162 lb 12.8 oz (73.846 kg)  ASSESSMENT AND PLAN:  1. permanent atrial fibrillation 2. Hypertensive heart disease without heart failure 3. diabetes mellitus type 2 4. rheumatoid arthritis 5. Osteoarthritis   Current medicines are reviewed at length with the patient today.  The patient does not have concerns regarding medicines.  The following changes have been made:  no change  Labs/ tests ordered today include:   Orders Placed This Encounter  Procedures  . EKG 12-Lead    Disposition: Continue on current medication.  Recheck in 4 months for office visit and EKG.  Await results of workup from GI concerning anemia.  Berna Spare MD 12/04/2014 5:32 PM    [facility] [address], [place], [place]  [address] Phone: [phone]; Fax: [phone]

## 2014-12-04 NOTE — Patient Instructions (Addendum)
Medication Instructions:  Your physician recommends that you continue on your current medications as directed. Please refer to the Current Medication list given to you today.   Labwork: None ordered  Testing/Procedures: None ordered  Follow-Up: Your physician wants you to follow-up in: 4 Months with EKG You will receive a reminder letter in the mail two months in advance. If you don't receive a letter, please call our office to schedule the follow-up appointment.

## 2015-01-15 ENCOUNTER — Other Ambulatory Visit: Payer: Self-pay | Admitting: Cardiology

## 2015-01-16 ENCOUNTER — Telehealth: Payer: Self-pay | Admitting: *Deleted

## 2015-01-16 NOTE — Telephone Encounter (Signed)
Received request from Dr Benson Norway to hold Xarelto prior to upcoming procedure Per  Dr. Mare Ferrari ok to hold 48 hours prior Given to Medical records to fax

## 2015-01-27 ENCOUNTER — Other Ambulatory Visit: Payer: Self-pay | Admitting: Cardiology

## 2015-02-04 ENCOUNTER — Telehealth: Payer: Self-pay | Admitting: Cardiology

## 2015-02-04 NOTE — Telephone Encounter (Signed)
New message    Patient had endo / colonoscopy on yesterday at [address]    C/O heart rate increase was told EKG was faxed over to Dr. Mare Ferrari on yesterday.

## 2015-02-04 NOTE — Telephone Encounter (Signed)
Dr. Mare Ferrari received EKG strip and discussed with Dr Benson Norway Advised patient per  Dr. Mare Ferrari to take full dose of Metoprolol and Diltiazem, verbalized understanding

## 2015-02-09 ENCOUNTER — Other Ambulatory Visit: Payer: Self-pay | Admitting: Gastroenterology

## 2015-02-10 ENCOUNTER — Other Ambulatory Visit: Payer: Self-pay | Admitting: Cardiology

## 2015-02-13 ENCOUNTER — Encounter (HOSPITAL_COMMUNITY): Payer: Self-pay | Admitting: *Deleted

## 2015-02-19 NOTE — H&P (Signed)
  [name] HPI: On 11/17/2009 the patient underwent an EGD with findings of an antral erosion, but the reason for the procedure was for dysphagia. She was prophylactically dilated and she reported some improvement. In 2013 she complained of dysphagia again and an esophagram was performed with the 13 mm barium tablet passing easily into the stomach. It turns out that her only problem with dysphagia was with the Pradaxa pill and she was recommended to take it with apple sauce. Per the patient her HGB was at 13 g/dL in July and then one month later her HGB dropped down to 10 g/dL. A rectal examination was performed and it was negative. Overall she is well. She has changed over from Pradaxa to Xarelto. She was supposed to have a follow up colonoscopy in 2013 as she had a 2 mm cecal adenoma in 2008 with Dr. Lajoyce Corners.  Past Medical History  Diagnosis Date  . Chest pain   . Atrial fibrillation ([place])   . Anemia     hx of  . Hypertension   . Rheumatoid arthritis(714.0)   . Gastroesophageal reflux disease   . Hyperlipidemia   . Hepatitis 1970  . Jaundice     age 69  . Actinic keratosis   . Atrial fibrillation ([place])   . Carpal tunnel syndrome   . Edema   . Hiatal hernia   . Memory loss   . Thyroid disease     multi nodular goiter  . Type II diabetes mellitus ([place])   . Anxiety   . Depression     Past Surgical History  Procedure Laterality Date  . Hand surgery  1995 and 1996    artificial joints both hands  . Cataract extraction Left   . Total hip arthroplasty Left 2009  . Total hip arthroplasty Right 04/03/2013    Procedure: RIGHT TOTAL HIP ARTHROPLASTY ANTERIOR APPROACH;  Surgeon: Gearlean Alf, MD;  Location: [facility];  Service: Orthopedics;  Laterality: Right;  . Joint replacement      Family History  Problem Relation Age of Onset  . Heart failure Sister   . Heart attack Neg Hx   . Stroke Father   . Hypertension Sister     Social History:  reports that she has never smoked. She  has never used smokeless tobacco. She reports that she does not drink alcohol or use illicit drugs.  Allergies: No Known Allergies  Medications: Scheduled: Continuous:  No results found for this or any previous visit (from the past 24 hour(s)).   No results found.  ROS:  As stated above in the HPI otherwise negative.  There were no vitals taken for this visit.    PE: Gen: NAD, Alert and Oriented HEENT:  [place]/AT, EOMI Neck: Supple, no LAD Lungs: CTA Bilaterally CV: RRR without M/G/R ABM: Soft, NTND, +BS Ext: No C/C/E  Assessment/Plan: 1) IDA - EGD/Colonoscopy.  [name] D 02/19/2015, 12:23 PM

## 2015-02-20 ENCOUNTER — Ambulatory Visit (HOSPITAL_COMMUNITY): Payer: Medicare Other | Admitting: Anesthesiology

## 2015-02-20 ENCOUNTER — Encounter (HOSPITAL_COMMUNITY): Payer: Self-pay

## 2015-02-20 ENCOUNTER — Ambulatory Visit (HOSPITAL_COMMUNITY)
Admission: RE | Admit: 2015-02-20 | Discharge: 2015-02-20 | Disposition: A | Discharge status: Home / Self Care | Payer: Medicare Other | Source: Ambulatory Visit | Attending: Gastroenterology | Admitting: Gastroenterology

## 2015-02-20 ENCOUNTER — Encounter (HOSPITAL_COMMUNITY)
Admission: RE | Disposition: A | Discharge status: Home / Self Care | Payer: Self-pay | Source: Ambulatory Visit | Attending: Gastroenterology

## 2015-02-20 DIAGNOSIS — R001 Bradycardia, unspecified: Secondary | ICD-10-CM | POA: Diagnosis not present

## 2015-02-20 DIAGNOSIS — R079 Chest pain, unspecified: Secondary | ICD-10-CM | POA: Diagnosis not present

## 2015-02-20 HISTORY — PX: COLONOSCOPY WITH PROPOFOL: SHX5780

## 2015-02-20 HISTORY — PX: ESOPHAGOGASTRODUODENOSCOPY (EGD) WITH PROPOFOL: SHX5813

## 2015-02-20 LAB — GLUCOSE, CAPILLARY: Glucose-Capillary: 80 mg/dL (ref 65–99)

## 2015-02-20 SURGERY — ESOPHAGOGASTRODUODENOSCOPY (EGD) WITH PROPOFOL
Anesthesia: Monitor Anesthesia Care

## 2015-02-20 MED ORDER — PROPOFOL 10 MG/ML IV BOLUS
INTRAVENOUS | Status: AC
  Filled 2015-02-20: qty 20

## 2015-02-20 MED ORDER — PROPOFOL 10 MG/ML IV BOLUS
INTRAVENOUS | Status: AC
  Filled 2015-02-20: qty 40

## 2015-02-20 MED ORDER — LIDOCAINE HCL (CARDIAC) 20 MG/ML IV SOLN
INTRAVENOUS | Status: AC
  Filled 2015-02-20: qty 5

## 2015-02-20 MED ORDER — PROMETHAZINE HCL 25 MG/ML IJ SOLN: 6.2500 mg | INTRAMUSCULAR | Status: DC | PRN

## 2015-02-20 MED ORDER — LACTATED RINGERS IV SOLN
INTRAVENOUS | Status: DC
  Administered 2015-02-20: 11:00:00 via INTRAVENOUS

## 2015-02-20 MED ORDER — PROPOFOL 10 MG/ML IV BOLUS
INTRAVENOUS | Status: DC | PRN
  Administered 2015-02-20 (×4): 20 mg via INTRAVENOUS

## 2015-02-20 MED ORDER — LIDOCAINE HCL (CARDIAC) 20 MG/ML IV SOLN
INTRAVENOUS | Status: DC | PRN
  Administered 2015-02-20: 50 mg via INTRAVENOUS

## 2015-02-20 MED ORDER — PROPOFOL 500 MG/50ML IV EMUL
INTRAVENOUS | Status: DC | PRN
  Administered 2015-02-20: 150 ug/kg/min via INTRAVENOUS

## 2015-02-20 MED ORDER — SODIUM CHLORIDE 0.9 % IV SOLN: INTRAVENOUS | Status: DC

## 2015-02-20 SURGICAL SUPPLY — 25 items

## 2015-02-20 NOTE — Anesthesia Postprocedure Evaluation (Signed)
  Anesthesia Post-op Note  Patient: [name]  Procedure(s) Performed: Procedure(s) (LRB): ESOPHAGOGASTRODUODENOSCOPY (EGD) WITH PROPOFOL (N/A) COLONOSCOPY WITH PROPOFOL (N/A)  Patient Location: PACU  Anesthesia Type: MAC  Level of Consciousness: awake and alert   Airway and Oxygen Therapy: Patient Spontanous Breathing  Post-op Pain: mild  Post-op Assessment: Post-op Vital signs reviewed, Patient's Cardiovascular Status Stable, Respiratory Function Stable, Patent Airway and No signs of Nausea or vomiting  Last Vitals:  Filed Vitals:   02/20/15 1139  BP:   Pulse: 112  Temp: 36.6 C  Resp: 18    Post-op Vital Signs: stable   Complications: No apparent anesthesia complications

## 2015-02-20 NOTE — Op Note (Signed)
[facility] [place], [address]   ENDOSCOPY PROCEDURE REPORT  PATIENT: [name], [name]  MR#: [id number] BIRTHDATE: 1946-01-30 , 32  yrs. old GENDER: female ENDOSCOPIST:Mirta Mally Benson Norway, MD REFERRED BY: PROCEDURE DATE:  03-10-15 PROCEDURE:   EGD w/ biopsy ASA CLASS:    Class III INDICATIONS:  Iron Deficiency Anemia MEDICATION: Monitored anesthesia care TOPICAL ANESTHETIC:   none  DESCRIPTION OF PROCEDURE:   After the risks and benefits of the procedure were explained, informed consent was obtained.  The endoscope was introduced through the mouth  and advanced to the second portion of the duodenum .  The instrument was slowly withdrawn as the mucosa was fully examined. Estimated blood loss is zero unless otherwise noted in this procedure report.    FINDINGS: The esophagus and gastroesophageal junction were completely normal in appearance.  The stomach was entered and closely examined.The antrum, angularis, and lesser curvature were well visualized, including a retroflexed view of the cardia and fundus.  The stomach wall was normally distensable.  The scope passed easily through the pylorus into the duodenum.  Cold biopsies of the duodenum were obtained to evaluate for Celiac disaese. Retroflexed views revealed no abnormalities.    The scope was then withdrawn from the patient and the procedure completed.  COMPLICATIONS: There were no immediate complications.  ENDOSCOPIC IMPRESSION: 1) Normal EGD. RECOMMENDATIONS: 1) Follow up biopsies.   _______________________________ eSignedCarol Ada, MD 2015-03-10 11:46 AM     cc:  CPT CODES: ICD CODES:  The ICD and CPT codes recommended by this software are interpretations from the data that the clinical staff has captured with the software.  The verification of the translation of this report to the ICD and CPT codes and modifiers is the sole responsibility of the health care institution  and practicing physician where this report was generated.  [employer]. will not be held responsible for the validity of the ICD and CPT codes included on this report.  AMA assumes no liability for data contained or not contained herein. CPT is a Designer, television/film set of the [employer].

## 2015-02-20 NOTE — Discharge Instructions (Signed)
Gastrointestinal Endoscopy, Care After °Refer to this sheet in the next few weeks. These instructions provide you with information on caring for yourself after your procedure. Your caregiver may also give you more specific instructions. Your treatment has been planned according to current medical practices, but problems sometimes occur. Call your caregiver if you have any problems or questions after your procedure. °HOME CARE INSTRUCTIONS °· If you were given medicine to help you relax (sedative), do not drive, operate machinery, or sign important documents for 24 hours. °· Avoid alcohol and hot or warm beverages for the first 24 hours after the procedure. °· Only take over-the-counter or prescription medicines for pain, discomfort, or fever as directed by your caregiver. You may resume taking your normal medicines unless your caregiver tells you otherwise. Ask your caregiver when you may resume taking medicines that may cause bleeding, such as aspirin, clopidogrel, or warfarin. °· You may return to your normal diet and activities on the day after your procedure, or as directed by your caregiver. Walking may help to reduce any bloated feeling in your abdomen. °· Drink enough fluids to keep your urine clear or pale yellow. °· You may gargle with salt water if you have a sore throat. °SEEK IMMEDIATE MEDICAL CARE IF: °· You have severe nausea or vomiting. °· You have severe abdominal pain, abdominal cramps that last longer than 6 hours, or abdominal swelling (distention). °· You have severe shoulder or back pain. °· You have trouble swallowing. °· You have shortness of breath, your breathing is shallow, or you are breathing faster than normal. °· You have a fever or a rapid heartbeat. °· You vomit blood or material that looks like coffee grounds. °· You have bloody, black, or tarry stools. °MAKE SURE YOU: °· Understand these instructions. °· Will watch your condition. °· Will get help right away if you are not doing  well or get worse. °  °This information is not intended to replace advice given to you by your health care provider. Make sure you discuss any questions you have with your health care provider. °  °Document Released: 11/03/2003 Document Revised: 04/11/2014 Document Reviewed: 06/21/2011 °Elsevier Interactive Patient Education ©2016 Elsevier Inc. ° °

## 2015-02-20 NOTE — Op Note (Signed)
[facility] [place], [id number]   COLONOSCOPY PROCEDURE REPORT  PATIENT: [name], [name]  MR#: [id number] BIRTHDATE: [birth year] , 52  yrs. old GENDER: female ENDOSCOPIST: Carol Ada, MD REFERRED BY: PROCEDURE DATE:  2015/03/07 PROCEDURE:   Colonoscopy with ablation and Colonoscopy with biopsy ASA CLASS:   Class III INDICATIONS: Iron Deficiency Anemia MEDICATIONS: Monitored anesthesia care  DESCRIPTION OF PROCEDURE:   After the risks and benefits and of the procedure were explained, informed consent was obtained.  revealed no abnormalities of the rectum.    The Pentax Ped Colon [id number] endoscope was introduced through the anus and advanced to the cecum, which was identified by both the appendix and ileocecal valve .  The quality of the prep was excellent. .  The instrument was then slowly withdrawn as the colon was fully examined. Estimated blood loss is zero unless otherwise noted in this procedure report.   FINDINGS: In the cecum a nonbleeding moderately-sized AVM was identified as well as a 2 mm polyp.  The AVM was partially obscured by a fold (Image 009).  The polyp was removed with a cold biopsy forcep and the AVM was ablated with APC.  A proximal transverse colon large lipoma was found (Image 012).  Scattered sigmoid diverticula were found (Image 013).     Retroflexed views revealed no abnormalities.     The scope was then withdrawn from the patient and the procedure completed.  WITHDRAWAL TIME: 19 mionutes  COMPLICATIONS: There were no immediate complications. ENDOSCOPIC IMPRESSION: 1) Nonbleeding cecal AVM. 2) 2 mm cecal polyp. 3) Transverse colon lipoma. 4) Diverticla. RECOMMENDATIONS: 1) Follow up biopsies.  Given the current findings, patient's age, and her comorbidities, I do not recommend a follow up colonoscopy for the polyp. 2) Follow up in the office in 1 month to check CBC. 3) Okay to restart Xarelto.  REPEAT  EXAM:  cc:  _______________________________ eSignedCarol Ada, MD Mar 07, 2015 11:44 AM   CPT CODES: ICD CODES:  The ICD and CPT codes recommended by this software are interpretations from the data that the clinical staff has captured with the software.  The verification of the translation of this report to the ICD and CPT codes and modifiers is the sole responsibility of the health care institution and practicing physician where this report was generated.  [employer]. will not be held responsible for the validity of the ICD and CPT codes included on this report.  AMA assumes no liability for data contained or not contained herein. CPT is a Designer, television/film set of the [employer].   PATIENT NAME:  [name], [name] MR#: [id number]

## 2015-02-20 NOTE — Anesthesia Preprocedure Evaluation (Signed)
Anesthesia Evaluation  Patient identified by MRN, date of birth, ID band Patient awake    Reviewed: Allergy & Precautions, H&P , NPO status , Patient's Chart, lab work & pertinent test results  Airway Mallampati: III  TM Distance: >3 FB Neck ROM: Full    Dental no notable dental hx.    Pulmonary neg pulmonary ROS,    Pulmonary exam normal breath sounds clear to auscultation       Cardiovascular hypertension, Pt. on medications Normal cardiovascular exam+ dysrhythmias Atrial Fibrillation  Rhythm:Regular Rate:Normal     Neuro/Psych negative neurological ROS  negative psych ROS   GI/Hepatic negative GI ROS, Neg liver ROS,   Endo/Other  diabetes, Type 2, Oral Hypoglycemic Agents  Renal/GU negative Renal ROS  negative genitourinary   Musculoskeletal negative musculoskeletal ROS (+) Arthritis , Rheumatoid disorders,    Abdominal   Peds negative pediatric ROS (+)  Hematology negative hematology ROS (+)   Anesthesia Other Findings   Reproductive/Obstetrics negative OB ROS                             Anesthesia Physical  Anesthesia Plan  ASA: III  Anesthesia Plan: MAC   Post-op Pain Management:    Induction:   Airway Management Planned: Simple Face Mask  Additional Equipment:   Intra-op Plan:   Post-operative Plan:   Informed Consent: I have reviewed the patients History and Physical, chart, labs and discussed the procedure including the risks, benefits and alternatives for the proposed anesthesia with the patient or authorized representative who has indicated his/her understanding and acceptance.   Dental advisory given  Plan Discussed with: CRNA  Anesthesia Plan Comments: (Afib. Rate is high, but Cardiology has her on triple meds for rate control and Dr. Benson Norway reports OK to proceed. BP is good. No SOB or CP. )        Anesthesia Quick Evaluation

## 2015-02-20 NOTE — Transfer of Care (Signed)
Immediate Anesthesia Transfer of Care Note  Patient: [name]  Procedure(s) Performed: Procedure(s): ESOPHAGOGASTRODUODENOSCOPY (EGD) WITH PROPOFOL (N/A) COLONOSCOPY WITH PROPOFOL (N/A)  Patient Location: PACU  Anesthesia Type:MAC  Level of Consciousness: awake, alert  and oriented  Airway & Oxygen Therapy: Patient Spontanous Breathing and Patient connected to nasal cannula oxygen  Post-op Assessment: Report given to RN and Post -op Vital signs reviewed and stable  Post vital signs: Reviewed and stable  Last Vitals:  Filed Vitals:   02/20/15 1139  BP:   Pulse: 112  Temp: 36.6 C  Resp: 18    Complications: No apparent anesthesia complications

## 2015-02-21 ENCOUNTER — Inpatient Hospital Stay (HOSPITAL_COMMUNITY)
Admission: EM | Admit: 2015-02-21 | Discharge: 2015-02-23 | DRG: 310 | Disposition: A | Discharge status: Home / Self Care | Payer: Medicare Other | Attending: Internal Medicine | Admitting: Internal Medicine

## 2015-02-21 ENCOUNTER — Emergency Department (HOSPITAL_COMMUNITY): Payer: Medicare Other

## 2015-02-21 ENCOUNTER — Encounter (HOSPITAL_COMMUNITY): Payer: Self-pay | Admitting: Emergency Medicine

## 2015-02-21 DIAGNOSIS — E119 Type 2 diabetes mellitus without complications: Secondary | ICD-10-CM | POA: Diagnosis present

## 2015-02-21 DIAGNOSIS — R072 Precordial pain: Secondary | ICD-10-CM | POA: Diagnosis not present

## 2015-02-21 DIAGNOSIS — F418 Other specified anxiety disorders: Secondary | ICD-10-CM | POA: Diagnosis not present

## 2015-02-21 DIAGNOSIS — I482 Chronic atrial fibrillation: Secondary | ICD-10-CM | POA: Diagnosis present

## 2015-02-21 DIAGNOSIS — I4891 Unspecified atrial fibrillation: Secondary | ICD-10-CM | POA: Diagnosis not present

## 2015-02-21 DIAGNOSIS — Z7952 Long term (current) use of systemic steroids: Secondary | ICD-10-CM | POA: Diagnosis not present

## 2015-02-21 DIAGNOSIS — Z86718 Personal history of other venous thrombosis and embolism: Secondary | ICD-10-CM

## 2015-02-21 DIAGNOSIS — R001 Bradycardia, unspecified: Principal | ICD-10-CM | POA: Diagnosis present

## 2015-02-21 DIAGNOSIS — Z7984 Long term (current) use of oral hypoglycemic drugs: Secondary | ICD-10-CM

## 2015-02-21 DIAGNOSIS — I4821 Permanent atrial fibrillation: Secondary | ICD-10-CM | POA: Diagnosis present

## 2015-02-21 DIAGNOSIS — M069 Rheumatoid arthritis, unspecified: Secondary | ICD-10-CM | POA: Diagnosis present

## 2015-02-21 DIAGNOSIS — R0789 Other chest pain: Secondary | ICD-10-CM | POA: Diagnosis not present

## 2015-02-21 DIAGNOSIS — E785 Hyperlipidemia, unspecified: Secondary | ICD-10-CM | POA: Diagnosis present

## 2015-02-21 DIAGNOSIS — E7800 Pure hypercholesterolemia, unspecified: Secondary | ICD-10-CM | POA: Diagnosis not present

## 2015-02-21 DIAGNOSIS — E1169 Type 2 diabetes mellitus with other specified complication: Secondary | ICD-10-CM

## 2015-02-21 DIAGNOSIS — K759 Inflammatory liver disease, unspecified: Secondary | ICD-10-CM | POA: Diagnosis present

## 2015-02-21 DIAGNOSIS — Z96643 Presence of artificial hip joint, bilateral: Secondary | ICD-10-CM | POA: Diagnosis present

## 2015-02-21 DIAGNOSIS — R079 Chest pain, unspecified: Secondary | ICD-10-CM

## 2015-02-21 DIAGNOSIS — M169 Osteoarthritis of hip, unspecified: Secondary | ICD-10-CM | POA: Diagnosis present

## 2015-02-21 DIAGNOSIS — K219 Gastro-esophageal reflux disease without esophagitis: Secondary | ICD-10-CM | POA: Diagnosis present

## 2015-02-21 DIAGNOSIS — I10 Essential (primary) hypertension: Secondary | ICD-10-CM | POA: Diagnosis present

## 2015-02-21 DIAGNOSIS — K5520 Angiodysplasia of colon without hemorrhage: Secondary | ICD-10-CM | POA: Diagnosis present

## 2015-02-21 DIAGNOSIS — Z7901 Long term (current) use of anticoagulants: Secondary | ICD-10-CM | POA: Diagnosis not present

## 2015-02-21 DIAGNOSIS — F329 Major depressive disorder, single episode, unspecified: Secondary | ICD-10-CM | POA: Diagnosis present

## 2015-02-21 DIAGNOSIS — I82402 Acute embolism and thrombosis of unspecified deep veins of left lower extremity: Secondary | ICD-10-CM | POA: Diagnosis present

## 2015-02-21 DIAGNOSIS — F419 Anxiety disorder, unspecified: Secondary | ICD-10-CM | POA: Diagnosis present

## 2015-02-21 LAB — COMPREHENSIVE METABOLIC PANEL
ALBUMIN: 2.8 g/dL — AB (ref 3.5–5.0)
ALT: 18 U/L (ref 14–54)
AST: 32 U/L (ref 15–41)
Alkaline Phosphatase: 138 U/L — ABNORMAL HIGH (ref 38–126)
Anion gap: 16 — ABNORMAL HIGH (ref 5–15)
BUN: 14 mg/dL (ref 6–20)
CHLORIDE: 106 mmol/L (ref 101–111)
CO2: 17 mmol/L — AB (ref 22–32)
CREATININE: 0.88 mg/dL (ref 0.44–1.00)
Calcium: 9 mg/dL (ref 8.9–10.3)
GFR calc Af Amer: >60 mL/min (ref >60)
GFR calc non Af Amer: >60 mL/min (ref >60)
Glucose, Bld: 204 mg/dL — ABNORMAL HIGH (ref 65–99)
POTASSIUM: 4.6 mmol/L (ref 3.5–5.1)
SODIUM: 139 mmol/L (ref 135–145)
Total Bilirubin: 0.7 mg/dL (ref 0.3–1.2)
Total Protein: 6.9 g/dL (ref 6.5–8.1)

## 2015-02-21 LAB — CBC WITH DIFFERENTIAL/PLATELET
BASOS ABS: 0 10*3/uL (ref 0.0–0.1)
Basophils Relative: 0 %
EOS ABS: 0.3 10*3/uL (ref 0.0–0.7)
EOS PCT: 3 %
HCT: 33.6 % — ABNORMAL LOW (ref 36.0–46.0)
Hemoglobin: 9.4 g/dL — ABNORMAL LOW (ref 12.0–15.0)
Lymphocytes Relative: 20 %
Lymphs Abs: 2.3 10*3/uL (ref 0.7–4.0)
MCH: 19.6 pg — ABNORMAL LOW (ref 26.0–34.0)
MCHC: 28 g/dL — ABNORMAL LOW (ref 30.0–36.0)
MCV: 70 fL — ABNORMAL LOW (ref 78.0–100.0)
MONO ABS: 0.7 10*3/uL (ref 0.1–1.0)
Monocytes Relative: 6 %
NEUTROS PCT: 71 %
Neutro Abs: 8.3 10*3/uL — ABNORMAL HIGH (ref 1.7–7.7)
PLATELETS: 476 10*3/uL — AB (ref 150–400)
RBC: 4.8 MIL/uL (ref 3.87–5.11)
RDW: 18.7 % — AB (ref 11.5–15.5)
WBC: 11.6 10*3/uL — AB (ref 4.0–10.5)

## 2015-02-21 LAB — I-STAT TROPONIN, ED: Troponin i, poc: 0 ng/mL (ref 0.00–0.08)

## 2015-02-21 LAB — DIGOXIN LEVEL: Digoxin Level: 0.5 ng/mL — ABNORMAL LOW (ref 0.8–2.0)

## 2015-02-21 LAB — APTT: APTT: 29 s (ref 24–37)

## 2015-02-21 LAB — TROPONIN I: Troponin I: <0.03 ng/mL (ref <0.031)

## 2015-02-21 LAB — BRAIN NATRIURETIC PEPTIDE: B NATRIURETIC PEPTIDE 5: 193 pg/mL — AB (ref 0.0–100.0)

## 2015-02-21 LAB — MRSA PCR SCREENING: MRSA by PCR: NEGATIVE

## 2015-02-21 LAB — PROTIME-INR
INR: 1.37 (ref 0.00–1.49)
PROTHROMBIN TIME: 17 s — AB (ref 11.6–15.2)

## 2015-02-21 LAB — GLUCOSE, CAPILLARY: GLUCOSE-CAPILLARY: 96 mg/dL (ref 65–99)

## 2015-02-21 MED ORDER — CELECOXIB 100 MG PO CAPS
100.0000 mg | ORAL_CAPSULE | Freq: Two times a day (BID) | ORAL | Status: DC
  Administered 2015-02-21 – 2015-02-23 (×4): 100 mg via ORAL
  Filled 2015-02-21 (×5): qty 1

## 2015-02-21 MED ORDER — HYDROCODONE-ACETAMINOPHEN 5-325 MG PO TABS: 1.0000 | ORAL_TABLET | ORAL | Status: DC | PRN

## 2015-02-21 MED ORDER — OCUVITE PO TABS
1.0000 | ORAL_TABLET | Freq: Every day | ORAL | Status: DC
  Administered 2015-02-22 – 2015-02-23 (×2): 1 via ORAL
  Filled 2015-02-21 (×2): qty 1

## 2015-02-21 MED ORDER — NITROGLYCERIN 0.4 MG SL SUBL: 0.4000 mg | SUBLINGUAL_TABLET | SUBLINGUAL | Status: DC | PRN

## 2015-02-21 MED ORDER — ALUM & MAG HYDROXIDE-SIMETH 200-200-20 MG/5ML PO SUSP: 30.0000 mL | Freq: Four times a day (QID) | ORAL | Status: DC | PRN

## 2015-02-21 MED ORDER — ACETAMINOPHEN 325 MG PO TABS: 650.0000 mg | ORAL_TABLET | Freq: Four times a day (QID) | ORAL | Status: DC | PRN

## 2015-02-21 MED ORDER — FUROSEMIDE 40 MG PO TABS
40.0000 mg | ORAL_TABLET | Freq: Every day | ORAL | Status: DC
  Administered 2015-02-22 – 2015-02-23 (×2): 40 mg via ORAL
  Filled 2015-02-21 (×2): qty 1

## 2015-02-21 MED ORDER — DIPHENHYDRAMINE HCL 25 MG PO CAPS
25.0000 mg | ORAL_CAPSULE | Freq: Once | ORAL | Status: AC
  Administered 2015-02-22: 25 mg via ORAL
  Filled 2015-02-21: qty 1

## 2015-02-21 MED ORDER — PREDNISONE 5 MG PO TABS
5.0000 mg | ORAL_TABLET | Freq: Every day | ORAL | Status: DC
  Administered 2015-02-22 – 2015-02-23 (×2): 5 mg via ORAL
  Filled 2015-02-21 (×2): qty 1

## 2015-02-21 MED ORDER — POTASSIUM CHLORIDE CRYS ER 20 MEQ PO TBCR
20.0000 meq | EXTENDED_RELEASE_TABLET | Freq: Two times a day (BID) | ORAL | Status: DC
  Administered 2015-02-21 – 2015-02-23 (×4): 20 meq via ORAL
  Filled 2015-02-21 (×4): qty 1

## 2015-02-21 MED ORDER — ONDANSETRON HCL 4 MG/2ML IJ SOLN
4.0000 mg | Freq: Four times a day (QID) | INTRAMUSCULAR | Status: DC | PRN
Start: 2015-02-21 — End: 2015-02-23

## 2015-02-21 MED ORDER — PRESERVISION AREDS PO CAPS: 1.0000 | ORAL_CAPSULE | Freq: Two times a day (BID) | ORAL | Status: DC

## 2015-02-21 MED ORDER — RIVAROXABAN 20 MG PO TABS
20.0000 mg | ORAL_TABLET | Freq: Every day | ORAL | Status: DC
Start: 2015-02-21 — End: 2015-02-23
  Administered 2015-02-21 – 2015-02-22 (×2): 20 mg via ORAL
  Filled 2015-02-21 (×2): qty 1

## 2015-02-21 MED ORDER — OMEGA 3 1200 MG PO CAPS: 1.0000 | ORAL_CAPSULE | Freq: Every day | ORAL | Status: DC

## 2015-02-21 MED ORDER — VITAMIN B-12 1000 MCG PO TABS
2000.0000 ug | ORAL_TABLET | Freq: Every day | ORAL | Status: DC
  Administered 2015-02-22 – 2015-02-23 (×2): 2000 ug via ORAL
  Filled 2015-02-21 (×2): qty 2

## 2015-02-21 MED ORDER — LEFLUNOMIDE 20 MG PO TABS
20.0000 mg | ORAL_TABLET | Freq: Every day | ORAL | Status: DC
  Administered 2015-02-22 – 2015-02-23 (×2): 20 mg via ORAL
  Filled 2015-02-21 (×2): qty 1

## 2015-02-21 MED ORDER — INSULIN ASPART 100 UNIT/ML ~~LOC~~ SOLN
0.0000 [IU] | Freq: Three times a day (TID) | SUBCUTANEOUS | Status: DC
  Administered 2015-02-22: 1 [IU] via SUBCUTANEOUS
  Administered 2015-02-22: 2 [IU] via SUBCUTANEOUS
  Administered 2015-02-23: 1 [IU] via SUBCUTANEOUS

## 2015-02-21 MED ORDER — ONDANSETRON HCL 4 MG PO TABS: 4.0000 mg | ORAL_TABLET | Freq: Four times a day (QID) | ORAL | Status: DC | PRN

## 2015-02-21 MED ORDER — PANTOPRAZOLE SODIUM 40 MG PO TBEC
40.0000 mg | DELAYED_RELEASE_TABLET | Freq: Every day | ORAL | Status: DC
  Administered 2015-02-21 – 2015-02-23 (×3): 40 mg via ORAL
  Filled 2015-02-21 (×3): qty 1

## 2015-02-21 MED ORDER — BUPROPION HCL ER (SR) 150 MG PO TB12
150.0000 mg | ORAL_TABLET | Freq: Two times a day (BID) | ORAL | Status: DC
  Administered 2015-02-21 – 2015-02-23 (×4): 150 mg via ORAL
  Filled 2015-02-21 (×4): qty 1

## 2015-02-21 MED ORDER — HYDRALAZINE HCL 20 MG/ML IJ SOLN: 5.0000 mg | INTRAMUSCULAR | Status: DC | PRN

## 2015-02-21 MED ORDER — VITAMIN D 1000 UNITS PO TABS
2000.0000 [IU] | ORAL_TABLET | Freq: Every day | ORAL | Status: DC
  Administered 2015-02-22 – 2015-02-23 (×2): 2000 [IU] via ORAL
  Filled 2015-02-21 (×2): qty 2

## 2015-02-21 MED ORDER — SODIUM CHLORIDE 0.9 % IJ SOLN
3.0000 mL | Freq: Two times a day (BID) | INTRAMUSCULAR | Status: DC
  Administered 2015-02-21 – 2015-02-23 (×4): 3 mL via INTRAVENOUS

## 2015-02-21 MED ORDER — MORPHINE SULFATE (PF) 2 MG/ML IV SOLN: 2.0000 mg | INTRAVENOUS | Status: DC | PRN

## 2015-02-21 MED ORDER — OMEGA-3-ACID ETHYL ESTERS 1 G PO CAPS
3.0000 g | ORAL_CAPSULE | Freq: Every day | ORAL | Status: DC
  Administered 2015-02-22 – 2015-02-23 (×2): 3 g via ORAL
  Filled 2015-02-21 (×2): qty 3

## 2015-02-21 MED ORDER — RIVAROXABAN 20 MG PO TABS: 20.0000 mg | ORAL_TABLET | Freq: Every day | ORAL | Status: DC

## 2015-02-21 MED ORDER — DIGOXIN 125 MCG PO TABS: 0.1250 mg | ORAL_TABLET | Freq: Every day | ORAL | Status: DC

## 2015-02-21 MED ORDER — ACETAMINOPHEN 650 MG RE SUPP: 650.0000 mg | Freq: Four times a day (QID) | RECTAL | Status: DC | PRN

## 2015-02-21 NOTE — ED Notes (Signed)
Pt here from home for eval of CP that started today while she was walking to the kitchen. Pt reports associated SOB with exertion. Pt sts she then checked her HR and it was 44, so she "decided to do something about it." Pt denies cough, fever. EMS reports HR down to 21 at 2 different times. No pain at present.

## 2015-02-21 NOTE — H&P (Addendum)
[facility] History and Physical  [name] [id number] DOB: [birth year] DOA: 02/21/2015  Referring physician: ED physician PCP: Tula Nakayama  Specialists:   Chief Complaint: Chest pain, bradycardia and shortness of breath   HPI: [name] is a 69 y.o. female with PMH of atrial fibrillation on Xarelto, hypertension, hyperlipidemia, diabetes mellitus, GERD, depression, anxiety, rheumatoid arthritis, hepatitis, DVT, who presents with chest pain, shortness of breath and bradycardia.  Pt states that she was in chair and had sudden onset of chest pain and SOB at about 4:30 PM. The episode lasted for a few minutes, then resolved spontaneously. She checked her heart rate which was 44. Then she had 2 more episodes chest pain and shortness of breath, both of which lasted for a few minutes. Per  EMS, her HR was down to ~20. Her chest pain was located in the left side of chest, pressure-like, nonradiating and was moderate. Patient does not have fever, chills, cough. She underwent EGD which was normal and colonoscopy which showed nonbleeding cecal AVM, 2 polyps and diverticula on 02/20/15. Per GI, Dr. Ulyses Amor note, pt can continue her Xarelto. She is on multiple agents for her Afib, including digoxin, cardizem, metoprolol. When I saw pt in ED, her chest pain and shortness of breath have resolved.  In ED, patient was found to have bradycardia, negative troponin, WBC 11.6, temperature normal, electrolytes and renal function okay. Chest x-ray showed trace amount of bilateral pleural effusion without infiltration, digoxin level 0.5. Patient's admitted to inpatient for further evaluation and treatment. EDP consulted cardiology, Dr. Radford Pax, who recommended to discontinue her blood pressure medications and reconsult cardiology in morning if patient is still symptomatic.   Where does patient live?   At home    Can patient participate in ADLs?  Yes      Review of Systems:   General: no  fevers, chills, no changes in body weight, has fatigue HEENT: no blurry vision, hearing changes or sore throat Pulm: has dyspnea, no coughing, wheezing CV: has chest pain, no palpitations Abd: no nausea, vomiting, abdominal pain, diarrhea, constipation GU: no dysuria, burning on urination, increased urinary frequency, hematuria  Ext: no leg edema Neuro: no unilateral weakness, numbness, or tingling, no vision change or hearing loss Skin: no rash MSK: No muscle spasm, no deformity, no limitation of range of movement in spin Heme: No easy bruising.  Travel history: No recent long distant travel.  Allergy: No Known Allergies  Past Medical History  Diagnosis Date  . Chest pain   . Atrial fibrillation ([place])   . Anemia     hx of  . Hypertension   . Rheumatoid arthritis(714.0)   . Gastroesophageal reflux disease   . Hyperlipidemia   . Hepatitis 1970  . Jaundice     age 62  . Actinic keratosis   . Atrial fibrillation ([place])   . Carpal tunnel syndrome   . Edema   . Hiatal hernia   . Memory loss   . Thyroid disease     multi nodular goiter  . Type II diabetes mellitus ([place])   . Anxiety   . Depression     Past Surgical History  Procedure Laterality Date  . Hand surgery  1995 and 1996    artificial joints both hands  . Cataract extraction Left   . Total hip arthroplasty Left 2009  . Total hip arthroplasty Right 04/03/2013    Procedure: RIGHT TOTAL HIP ARTHROPLASTY ANTERIOR APPROACH;  Surgeon: Gearlean Alf, MD;  Location:  [facility];  Service: Orthopedics;  Laterality: Right;  . Joint replacement      Social History:  reports that she has never smoked. She has never used smokeless tobacco. She reports that she does not drink alcohol or use illicit drugs.  Family History:  Family History  Problem Relation Age of Onset  . Heart failure Sister   . Heart attack Neg Hx   . Stroke Father   . Hypertension Sister      Prior to Admission medications   Medication Sig Start Date  End Date Taking? Authorizing Provider  buPROPion (WELLBUTRIN SR) 150 MG 12 hr tablet Take 150 mg by mouth 2 (two) times daily.      Historical Provider, MD  celecoxib (CELEBREX) 100 MG capsule Take 100 mg by mouth 2 (two) times daily. 09/02/14   Historical Provider, MD  Cholecalciferol (VITAMIN D3) 2000 UNITS TABS Take 2,000 Units by mouth daily.     Historical Provider, MD  Cyanocobalamin (VITAMIN B 12 PO) Take 2,000 mcg by mouth daily.     Historical Provider, MD  digoxin (LANOXIN) 0.125 MG tablet TAKE 1 TABLET (0.125 MG TOTAL) BY MOUTH EVERY MORNING. 09/18/14   Darlin Coco, MD  diltiazem (CARDIZEM CD) 360 MG 24 hr capsule TAKE 1 CAPSULE BY MOUTH DAILY 01/15/15   Darlin Coco, MD  esomeprazole (NEXIUM) 40 MG capsule Take 40 mg by mouth daily.      Historical Provider, MD  furosemide (LASIX) 40 MG tablet TAKE 1 TABLET BY MOUTH EVERY DAY 02/11/15   Darlin Coco, MD  glimepiride (AMARYL) 2 MG tablet Take 2 mg by mouth every morning. 11/03/14   Historical Provider, MD  HYDROcodone-acetaminophen (NORCO/VICODIN) 5-325 MG tablet Take 1 tablet by mouth every 4 (four) hours as needed. 01/25/15   Historical Provider, MD  KLOR-CON 10 10 MEQ tablet TAKE 2 TABLETS BY MOUTH TWICE A DAY 01/27/15   Darlin Coco, MD  leflunomide (ARAVA) 20 MG tablet Take 20 mg by mouth daily.  04/26/13   Historical Provider, MD  metFORMIN (GLUCOPHAGE-XR) 500 MG 24 hr tablet Take 1,000 mg by mouth 2 (two) times daily. 09/02/14   Historical Provider, MD  metoprolol succinate (TOPROL-XL) 100 MG 24 hr tablet Take 2 tablets (200 mg total) by mouth 2 (two) times daily. 11/24/14   Darlin Coco, MD  Multiple Vitamins-Minerals (PRESERVISION AREDS) CAPS Take 1 capsule by mouth 2 (two) times daily.    Historical Provider, MD  nitroGLYCERIN (NITROSTAT) 0.4 MG SL tablet Place 0.4 mg under the tongue every 5 (five) minutes as needed for chest pain (chest pain).    Historical Provider, MD  Omega 3 1200 MG CAPS Take 1 capsule by  mouth daily.     Historical Provider, MD  ORENCIA 125 MG/ML SOSY Inject 125 mg into the skin every 7 (seven) days. Either Wed or Thurs. 01/27/15   Historical Provider, MD  predniSONE (DELTASONE) 5 MG tablet Take 1 tablet (5 mg total) by mouth daily. Take 2 tablet (10mg ) daily for 2 days before resume 5mg  daily thereafter Patient taking differently: Take 5 mg by mouth daily.  10/10/14   Almyra Deforest, PA  rivaroxaban (XARELTO) 20 MG TABS tablet Take 1 tablet (20 mg total) by mouth daily with supper. 07/08/14   Truitt Merle, MD    Physical Exam: Filed Vitals:   02/21/15 1900 02/21/15 1930 02/21/15 1945 02/21/15 2030  BP: 130/56 127/85 121/56 145/61  Pulse: 58 36 65 71  Temp:    97.7 F (36.5 C)  TempSrc:      Resp: 25 23 25 19   Height:    5\' 7"  (1.702 m)  Weight:    74.526 kg (164 lb 4.8 oz)  SpO2: 97% 91% 97% 100%   General: Not in acute distress HEENT:       Eyes: PERRL, EOMI, no scleral icterus.       ENT: No discharge from the ears and nose, no pharynx injection, no tonsillar enlargement.        Neck: No JVD, no bruit, no mass felt. Heme: No neck lymph node enlargement. Cardiac: S1/S2, irregularly irregular rhythm, bradycardia, No murmurs, No gallops or rubs. Pulm: No rales, wheezing, rhonchi or rubs. Abd: Soft, nondistended, nontender, no rebound pain, no organomegaly, BS present. Ext: No pitting leg edema bilaterally. 2+DP/PT pulse bilaterally. Musculoskeletal: No joint deformities, No joint redness or warmth, no limitation of ROM in spin. Skin: No rashes.  Neuro: Alert, oriented X3, cranial nerves II-XII grossly intact, muscle strength 5/5 in all extremities. Psych: Patient is not psychotic, no suicidal or hemocidal ideation.  Labs on Admission:  Basic Metabolic Panel:  Recent Labs Lab 02/21/15 1746  NA 139  K 4.6  CL 106  CO2 17*  GLUCOSE 204*  BUN 14  CREATININE 0.88  CALCIUM 9.0   Liver Function Tests:  Recent Labs Lab 02/21/15 1746  AST 32  ALT 18  ALKPHOS 138*   BILITOT 0.7  PROT 6.9  ALBUMIN 2.8*   No results for input(s): LIPASE, AMYLASE in the last 168 hours. No results for input(s): AMMONIA in the last 168 hours. CBC:  Recent Labs Lab 02/21/15 1746  WBC 11.6*  NEUTROABS 8.3*  HGB 9.4*  HCT 33.6*  MCV 70.0*  PLT 476*   Cardiac Enzymes: No results for input(s): CKTOTAL, CKMB, CKMBINDEX, TROPONINI in the last 168 hours.  BNP (last 3 results)  Recent Labs  10/08/14 1639 02/21/15 1857  BNP 226.8* 193.0*    ProBNP (last 3 results) No results for input(s): PROBNP in the last 8760 hours.  CBG:  Recent Labs Lab 02/20/15 1034 02/21/15 2026  GLUCAP 80 96    Radiological Exams on Admission: Dg Chest 2 View  02/21/2015  CLINICAL DATA:  Chest pain EXAM: CHEST  2 VIEW COMPARISON:  10/08/2014 chest radiograph. FINDINGS: Stable cardiomediastinal silhouette with mild cardiomegaly. New trace bilateral pleural effusions. No pneumothorax. Clear lungs, with no focal lung consolidation and no pulmonary edema. IMPRESSION: 1. Stable mild cardiomegaly.  No pulmonary edema. 2. New trace bilateral pleural effusions. Electronically Signed   By: Ilona Sorrel M.D.   On: 02/21/2015 18:14    EKG: Independently reviewed.  QTC 418, atrial fibrillation with bradycardia  Assessment/Plan Principal Problem:   Chest pain Active Problems:   Hypertension   Hypercholesterolemia   DM2 (diabetes mellitus, type 2) (HCC)   Rheumatoid arthritis (HCC)   Atrial fibrillation with slow ventricular response (HCC)   Anxiety and depression   Gastroesophageal reflux disease   Hepatitis   Osteoarthritis of hip   Deep vein thrombosis, lower left extremity (HCC)   Symptomatic bradycardia  Chest pain and SOB: Most likely due to bradycardia. The CXR is negative for infiltration. Currently no chest pain or shortness of breath, less likely to have pulmonary embolism. EDP consulted cardiology, Dr. Radford Pax, who recommended to discontinue her blood pressure medications  and reconsult cardiology in morning if patient is still symptomatic.   -will admit to SDU. -pacer pat at bedside -hold Cardizem and metoprolol -tele monitoring -trop x 3 -  repeat 2d echo -When necessary nitroglycerin  Hypertension: -Hold Cardizem and metoprolol as above -IV hydralazine when necessary -On Lasix  Atrial Fibrillation with slow heart rate: CHA2DS2-VASc Score is 4, needs oral anticoagulation. Patient is on [place] at home. INR is pending on admission. Patient is bradycardic on admission, which is symptomatic. She had went EGD and colonoscopy on 11/18, but okay to restart Xarelto per Dr. Benson Norway. Degox level is 0.5.  -Hold Cardizem and metoprolol -Continue degox at the current dose -tele monitor -Xarelto  Symptomatic bradycardia: -Hold Cardizem and metoprolol as above -Check TSH  HLD: Last LDL was not on record  -Continue home medications:  Fish oil -Check FLP  DM-II: Last A1c 8.4 on 09/06/12, poorly controled. Patient is taking metformin and Amaryl at home -SSI -Check A1c  Rheumatoid arthritis ([place]): on O[place], leflunomide and prednisone. Stable. -Continue home medications -Check cortisol level  Depression and anxiety: Stable, no suicidal or homicidal ideations. -Continue home medications: Wellbutrin  GERD: -Protonix  Deep vein thrombosis, lower left extremity ([place]): -On coumadin  DVT ppx: On Xarelto Code Status: Full code Family Communication:  Yes, patient's friend at bed side Disposition Plan: Admit to inpatient   Date of Service 02/21/2015    Ivor Costa [facility] Pager [phone]  If 7PM-7AM, please contact night-coverage [email or web address] Password [email or web address] 02/21/2015, 9:25 PM

## 2015-02-21 NOTE — ED Notes (Signed)
De-fib pads placed on pt due to pts HR.  [place]oll at bedside.

## 2015-02-21 NOTE — ED Provider Notes (Signed)
CSN: [id number]     Arrival date & time 02/21/15  1710 History   First MD Initiated Contact with Patient 02/21/15 1712     Chief Complaint  Patient presents with  . Chest Pain     (Consider location/radiation/quality/duration/timing/severity/associated sxs/prior Treatment) The history is provided by the patient.  Jerlyn L Addeo is a 69 y.o. female hx of afib on xarelto, HTN, HL, here with bradycardia, shortness of breath, chest pain. Patient actually had endoscopy yesterday that was unremarkable. Patient states that this afternoon, she was walking to the kitchen and had sudden onset of substernal chest pain. She also has some shortness of breath as well. She sat down and check her heart rate and was low 40s. She then stood up again and feels chest pressure again sat back down. Per EMS, her heart rate sometimes drops down to the low 20s. She is on multiple blood pressure agents including digoxin, cardizem, metoprolol.    Past Medical History  Diagnosis Date  . Chest pain   . Atrial fibrillation ([place])   . Anemia     hx of  . Hypertension   . Rheumatoid arthritis(714.0)   . Gastroesophageal reflux disease   . Hyperlipidemia   . Hepatitis 1970  . Jaundice     age 89  . Actinic keratosis   . Atrial fibrillation ([place])   . Carpal tunnel syndrome   . Edema   . Hiatal hernia   . Memory loss   . Thyroid disease     multi nodular goiter  . Type II diabetes mellitus ([place])   . Anxiety   . Depression    Past Surgical History  Procedure Laterality Date  . Hand surgery  1995 and 1996    artificial joints both hands  . Cataract extraction Left   . Total hip arthroplasty Left 2009  . Total hip arthroplasty Right 04/03/2013    Procedure: RIGHT TOTAL HIP ARTHROPLASTY ANTERIOR APPROACH;  Surgeon: Gearlean Alf, MD;  Location: [facility];  Service: Orthopedics;  Laterality: Right;  . Joint replacement     Family History  Problem Relation Age of Onset  . Heart failure Sister   . Heart  attack Neg Hx   . Stroke Father   . Hypertension Sister    Social History  Substance Use Topics  . Smoking status: Never Smoker   . Smokeless tobacco: Never Used  . Alcohol Use: No   OB History    No data available     Review of Systems  Cardiovascular: Positive for chest pain.  All other systems reviewed and are negative.     Allergies  Review of patient's allergies indicates no known allergies.  Home Medications   Prior to Admission medications   Medication Sig Start Date End Date Taking? Authorizing Provider  buPROPion (WELLBUTRIN SR) 150 MG 12 hr tablet Take 150 mg by mouth 2 (two) times daily.      Historical Provider, MD  celecoxib (CELEBREX) 100 MG capsule Take 100 mg by mouth 2 (two) times daily. 09/02/14   Historical Provider, MD  Cholecalciferol (VITAMIN D3) 2000 UNITS TABS Take 2,000 Units by mouth daily.     Historical Provider, MD  Cyanocobalamin (VITAMIN B 12 PO) Take 2,000 mcg by mouth daily.     Historical Provider, MD  digoxin (LANOXIN) 0.125 MG tablet TAKE 1 TABLET (0.125 MG TOTAL) BY MOUTH EVERY MORNING. 09/18/14   Darlin Coco, MD  diltiazem (CARDIZEM CD) 360 MG 24 hr capsule TAKE 1  CAPSULE BY MOUTH DAILY 01/15/15   Darlin Coco, MD  esomeprazole (NEXIUM) 40 MG capsule Take 40 mg by mouth daily.      Historical Provider, MD  furosemide (LASIX) 40 MG tablet TAKE 1 TABLET BY MOUTH EVERY DAY 02/11/15   Darlin Coco, MD  glimepiride (AMARYL) 2 MG tablet Take 2 mg by mouth every morning. 11/03/14   Historical Provider, MD  HYDROcodone-acetaminophen (NORCO/VICODIN) 5-325 MG tablet Take 1 tablet by mouth every 4 (four) hours as needed. 01/25/15   Historical Provider, MD  KLOR-CON 10 10 MEQ tablet TAKE 2 TABLETS BY MOUTH TWICE A DAY 01/27/15   Darlin Coco, MD  leflunomide (ARAVA) 20 MG tablet Take 20 mg by mouth daily.  04/26/13   Historical Provider, MD  metFORMIN (GLUCOPHAGE-XR) 500 MG 24 hr tablet Take 1,000 mg by mouth 2 (two) times daily. 09/02/14    Historical Provider, MD  metoprolol succinate (TOPROL-XL) 100 MG 24 hr tablet Take 2 tablets (200 mg total) by mouth 2 (two) times daily. 11/24/14   Darlin Coco, MD  Multiple Vitamins-Minerals (PRESERVISION AREDS) CAPS Take 1 capsule by mouth 2 (two) times daily.    Historical Provider, MD  nitroGLYCERIN (NITROSTAT) 0.4 MG SL tablet Place 0.4 mg under the tongue every 5 (five) minutes as needed for chest pain (chest pain).    Historical Provider, MD  Omega 3 1200 MG CAPS Take 1 capsule by mouth daily.     Historical Provider, MD  ORENCIA 125 MG/ML SOSY Inject 125 mg into the skin every 7 (seven) days. Either Wed or Thurs. 01/27/15   Historical Provider, MD  predniSONE (DELTASONE) 5 MG tablet Take 1 tablet (5 mg total) by mouth daily. Take 2 tablet (10mg ) daily for 2 days before resume 5mg  daily thereafter Patient taking differently: Take 5 mg by mouth daily.  10/10/14   Almyra Deforest, PA  rivaroxaban (XARELTO) 20 MG TABS tablet Take 1 tablet (20 mg total) by mouth daily with supper. 07/08/14   Truitt Merle, MD   BP 114/73 mmHg  Pulse 36  Temp(Src) 97.8 F (36.6 C) (Oral)  Resp 19  Wt 160 lb (72.576 kg)  SpO2 97% Physical Exam  Constitutional: She is oriented to person, place, and time. She appears well-developed and well-nourished.  HENT:  Head: Normocephalic.  Mouth/Throat: Oropharynx is clear and moist.  Eyes: Conjunctivae are normal. Pupils are equal, round, and reactive to light.  Neck: Normal range of motion. Neck supple.  Cardiovascular:  Bradycardic, irregular   Pulmonary/Chest: Effort normal and breath sounds normal. No respiratory distress. She has no wheezes.  Abdominal: Soft. Bowel sounds are normal. She exhibits no distension. There is no tenderness. There is no rebound.  Musculoskeletal: Normal range of motion. She exhibits no edema or tenderness.  Neurological: She is alert and oriented to person, place, and time. No cranial nerve deficit. Coordination normal.  Skin: Skin is warm  and dry.  Psychiatric: She has a normal mood and affect. Her behavior is normal. Judgment and thought content normal.  Nursing note and vitals reviewed.   ED Course  Procedures (including critical care time) Labs Review Labs Reviewed  CBC WITH DIFFERENTIAL/PLATELET  COMPREHENSIVE METABOLIC PANEL  DIGOXIN LEVEL  I-STAT [place], ED    Imaging Review No results found. I have personally reviewed and evaluated these images and lab results as part of my medical decision-making.   EKG Interpretation   Date/Time:  Saturday February 21 2015 17:29:13 EST Ventricular Rate:  44 PR Interval:  QRS Duration: 70 QT Interval:  489 QTC Calculation: 418 R Axis:   80 Text Interpretation:  Atrial fibrillation Low voltage, precordial leads  Anteroseptal infarct, old Nonspecific repol abnormality, diffuse leads  heart rate lower than previous Confirmed by YAO  MD, DAVID ([id number]) on  02/21/2015 5:41:56 PM      MDM   Final diagnoses:  None   Ruhama L Maurer is a 69 y.o. female here with chest pain, bradycardia. Symptoms likely from bradycardia. Patient bradycardic in the 30s, in slow afib. Will check dig level, labs, CXR. Will likely admit.   7 pm Called Dr. Radford Pax from cardiology, recommend holding BP meds and consult cards in AM if still bradycardic. Patient hasn't passed out and has no chest pain at rest. Will admit to telemetry.     Wandra Arthurs, MD 02/21/15 [phone]

## 2015-02-22 ENCOUNTER — Encounter (HOSPITAL_COMMUNITY): Payer: Self-pay | Admitting: Physician Assistant

## 2015-02-22 ENCOUNTER — Inpatient Hospital Stay (HOSPITAL_COMMUNITY): Payer: Medicare Other

## 2015-02-22 DIAGNOSIS — I4891 Unspecified atrial fibrillation: Secondary | ICD-10-CM

## 2015-02-22 DIAGNOSIS — E7800 Pure hypercholesterolemia, unspecified: Secondary | ICD-10-CM

## 2015-02-22 DIAGNOSIS — R001 Bradycardia, unspecified: Principal | ICD-10-CM

## 2015-02-22 DIAGNOSIS — I82402 Acute embolism and thrombosis of unspecified deep veins of left lower extremity: Secondary | ICD-10-CM

## 2015-02-22 DIAGNOSIS — R072 Precordial pain: Secondary | ICD-10-CM

## 2015-02-22 DIAGNOSIS — I10 Essential (primary) hypertension: Secondary | ICD-10-CM

## 2015-02-22 LAB — TROPONIN I: Troponin I: <0.03 ng/mL (ref <0.031)

## 2015-02-22 LAB — GLUCOSE, CAPILLARY
GLUCOSE-CAPILLARY: 65 mg/dL (ref 65–99)
GLUCOSE-CAPILLARY: 107 mg/dL — AB (ref 65–99)
GLUCOSE-CAPILLARY: 128 mg/dL — AB (ref 65–99)
Glucose-Capillary: 155 mg/dL — ABNORMAL HIGH (ref 65–99)
Glucose-Capillary: 162 mg/dL — ABNORMAL HIGH (ref 65–99)

## 2015-02-22 LAB — BASIC METABOLIC PANEL
Anion gap: 10 (ref 5–15)
BUN: 16 mg/dL (ref 6–20)
CALCIUM: 8.8 mg/dL — AB (ref 8.9–10.3)
CO2: 25 mmol/L (ref 22–32)
Chloride: 106 mmol/L (ref 101–111)
Creatinine, Ser: 0.73 mg/dL (ref 0.44–1.00)
GFR calc Af Amer: >60 mL/min (ref >60)
GLUCOSE: 81 mg/dL (ref 65–99)
POTASSIUM: 4 mmol/L (ref 3.5–5.1)
Sodium: 141 mmol/L (ref 135–145)

## 2015-02-22 LAB — LIPID PANEL
CHOL/HDL RATIO: 8.7 ratio
CHOLESTEROL: 156 mg/dL (ref 0–200)
HDL: 18 mg/dL — ABNORMAL LOW (ref >40)
LDL Cholesterol: 112 mg/dL — ABNORMAL HIGH (ref 0–99)
Triglycerides: 130 mg/dL (ref <150)
VLDL: 26 mg/dL (ref 0–40)

## 2015-02-22 LAB — TSH: TSH: 1.675 u[IU]/mL (ref 0.350–4.500)

## 2015-02-22 LAB — CBC
HEMATOCRIT: 30.9 % — AB (ref 36.0–46.0)
HEMOGLOBIN: 8.7 g/dL — AB (ref 12.0–15.0)
MCH: 19.4 pg — AB (ref 26.0–34.0)
MCHC: 28.2 g/dL — AB (ref 30.0–36.0)
MCV: 69 fL — ABNORMAL LOW (ref 78.0–100.0)
Platelets: 424 10*3/uL — ABNORMAL HIGH (ref 150–400)
RBC: 4.48 MIL/uL (ref 3.87–5.11)
RDW: 18.4 % — AB (ref 11.5–15.5)
WBC: 11.9 10*3/uL — ABNORMAL HIGH (ref 4.0–10.5)

## 2015-02-22 LAB — CORTISOL-AM, BLOOD: Cortisol - AM: 9.2 ug/dL (ref 6.7–22.6)

## 2015-02-22 MED ORDER — DEXTROSE 50 % IV SOLN
50.0000 mL | Freq: Once | INTRAVENOUS | Status: AC
  Administered 2015-02-22: 50 mL via INTRAVENOUS
  Filled 2015-02-22: qty 50

## 2015-02-22 MED ORDER — ALPRAZOLAM 0.25 MG PO TABS
0.2500 mg | ORAL_TABLET | Freq: Once | ORAL | Status: AC
  Administered 2015-02-22: 0.25 mg via ORAL
  Filled 2015-02-22: qty 1

## 2015-02-22 MED ORDER — DILTIAZEM HCL 60 MG PO TABS
60.0000 mg | ORAL_TABLET | Freq: Four times a day (QID) | ORAL | Status: DC
  Administered 2015-02-22 – 2015-02-23 (×4): 60 mg via ORAL
  Filled 2015-02-22 (×4): qty 1

## 2015-02-22 MED ORDER — METOPROLOL TARTRATE 25 MG PO TABS
25.0000 mg | ORAL_TABLET | Freq: Two times a day (BID) | ORAL | Status: DC
  Administered 2015-02-22 (×2): 25 mg via ORAL
  Filled 2015-02-22 (×2): qty 1

## 2015-02-22 NOTE — Consult Note (Signed)
CARDIOLOGY CONSULT NOTE   Patient ID: [name] MRN: [id number] DOB/AGE: [birth year] 69 y.o.  Admit date: 02/21/2015  Primary Physician   [name] Primary Cardiologist   Dr Mare Ferrari Reason for Consultation    Bradycardia  [id number] [name] is a 69 y.o. year old female with a history of atrial fibrillation on Xarelto, normal EF by echo 2016, hypertension, hyperlipidemia, diabetes mellitus, GERD, depression, anxiety, rheumatoid arthritis, hepatitis, and DVT (on Pradaxa), 69 years old was admitted 11/19 with chest pain, shortness of breath and bradycardia.  Admit 07/06 for rapid atrial fib, d/c on Cardizem CD 360, Toprol XL 150 mg bid and dig 0.125 mg.   10/22/2014 Toprol XL increased to 200 mg bid  On 11/18 she had an outpatient EGD and colonoscopy for anemia. The EGD was normal and the colon showed non-bleeding cecal AVM, diverticula, a small polyp and a lipoma that were biopsied. She was cleared to restart Xarelto.   On 11/19, she got up to go to the kitchen and felt a great heaviness in her lower left chest. It was hard to breathe. The same thing happened again when she tried to get up. She sat in the chair for a while, then was able to get to the bedroom. She noted her HR to be low and called EMS. Per verbal reports, her HR was in the 20s at times. In the hospital, she was initially in the 30s, but has trended up and is now in rapid atrial fib.   The pressure in her chest reached a 9/10. The pain was worse when she tried to get up and was worse with deep inspiration. She tried no meds at home and got none by EMS. The pain resolved on its own after about 15 minutes.   Her HR has increased  Past Medical History  Diagnosis Date  . Chest pain   . Atrial fibrillation ([place])   . Anemia     hx of  . Hypertension   . Rheumatoid arthritis(714.0)   . Gastroesophageal reflux disease   . Hyperlipidemia   . Hepatitis 1970  . Jaundice     age 69  . Actinic keratosis   .  Atrial fibrillation ([place])   . Carpal tunnel syndrome   . Edema   . Hiatal hernia   . Memory loss   . Thyroid disease     multi nodular goiter  . Type II diabetes mellitus ([place])   . Anxiety   . Depression      Past Surgical History  Procedure Laterality Date  . Hand surgery  1995 and 1996    artificial joints both hands  . Cataract extraction Left   . Total hip arthroplasty Left 2009  . Total hip arthroplasty Right 04/03/2013    Procedure: RIGHT TOTAL HIP ARTHROPLASTY ANTERIOR APPROACH;  Surgeon: Gearlean Alf, MD;  Location: [facility];  Service: Orthopedics;  Laterality: Right;  . Joint replacement      No Known Allergies  I have reviewed the patient's current medications . beta carotene w/minerals  1 tablet Oral Daily  . buPROPion  150 mg Oral BID  . celecoxib  100 mg Oral BID  . cholecalciferol  2,000 Units Oral Daily  . digoxin  0.125 mg Oral Daily  . furosemide  40 mg Oral Daily  . insulin aspart  0-9 Units Subcutaneous TID WC  . leflunomide  20 mg Oral Daily  . omega-3 acid ethyl esters  3 g Oral Daily  .  pantoprazole  40 mg Oral Daily  . potassium chloride SA  20 mEq Oral BID  . predniSONE  5 mg Oral Daily  . rivaroxaban  20 mg Oral Q supper  . sodium chloride  3 mL Intravenous Q12H  . vitamin B-12  2,000 mcg Oral Daily     acetaminophen **OR** acetaminophen, alum & mag hydroxide-simeth, hydrALAZINE, HYDROcodone-acetaminophen, morphine injection, nitroGLYCERIN, ondansetron **OR** ondansetron (ZOFRAN) IV  Medication Sig  buPROPion (WELLBUTRIN SR) 150 MG 12 hr tablet Take 150 mg by mouth 2 (two) times daily.    celecoxib (CELEBREX) 100 MG capsule Take 100 mg by mouth 2 (two) times daily.  Cholecalciferol (VITAMIN D3) 2000 UNITS TABS Take 2,000 Units by mouth daily.   Cyanocobalamin (VITAMIN B 12 PO) Take 2,000 mcg by mouth daily.   digoxin (LANOXIN) 0.125 MG tablet TAKE 1 TABLET (0.125 MG TOTAL) BY MOUTH EVERY MORNING.  diltiazem (CARDIZEM CD) 360 MG 24 hr  capsule TAKE 1 CAPSULE BY MOUTH DAILY  esomeprazole (NEXIUM) 40 MG capsule Take 40 mg by mouth daily.    furosemide (LASIX) 40 MG tablet TAKE 1 TABLET BY MOUTH EVERY DAY  glimepiride (AMARYL) 2 MG tablet Take 2 mg by mouth every morning.  KLOR-CON 10 10 MEQ tablet TAKE 2 TABLETS BY MOUTH TWICE A DAY  leflunomide (ARAVA) 20 MG tablet Take 20 mg by mouth daily.   metFORMIN (GLUCOPHAGE-XR) 500 MG 24 hr tablet Take 1,000 mg by mouth 2 (two) times daily.  metoprolol succinate (TOPROL-XL) 100 MG 24 hr tablet Take 2 tablets (200 mg total) by mouth 2 (two) times daily.  Multiple Vitamins-Minerals (PRESERVISION AREDS) CAPS Take 1 capsule by mouth 2 (two) times daily.  Omega 3 1200 MG CAPS Take 1 capsule by mouth daily.   ORENCIA 125 MG/ML SOSY Inject 125 mg into the skin every 7 (seven) days. Either Wed or Thurs.  predniSONE (DELTASONE) 5 MG tablet Take 1 tablet (5 mg total) by mouth daily. Take 2 tablet (10mg ) daily for 2 days before resume 5mg  daily thereafter Patient taking differently: Take 5 mg by mouth daily.   rivaroxaban (XARELTO) 20 MG TABS tablet Take 1 tablet (20 mg total) by mouth daily with supper.  HYDROcodone-acetaminophen (NORCO/VICODIN) 5-325 MG tablet Take 1 tablet by mouth every 4 (four) hours as needed.  nitroGLYCERIN (NITROSTAT) 0.4 MG SL tablet Place 0.4 mg under the tongue every 5 (five) minutes as needed for chest pain (chest pain).     Social History   Social History  . Marital Status: Divorced    Spouse Name: N/A  . Number of Children: N/A  . Years of Education: N/A   Occupational History  . Retired    Social History Main Topics  . Smoking status: Never Smoker   . Smokeless tobacco: Never Used  . Alcohol Use: No  . Drug Use: No  . Sexual Activity: Not Currently   Other Topics Concern  . Not on file   Social History Narrative    Family Status  Relation Status Death Age  . Mother Deceased 47    pnuemonia  . Father Deceased 10    cva  . Maternal  Grandmother Deceased   . Maternal Grandfather Deceased   . Paternal Grandmother Deceased   . Paternal Grandfather Deceased    Family History  Problem Relation Age of Onset  . Heart failure Sister   . Heart attack Neg Hx   . Stroke Father   . Hypertension Sister      ROS:  Full 14 point review of systems complete and found to be negative unless listed above.  Physical Exam: Blood pressure 155/70, pulse 96, temperature 98 F (36.7 C), temperature source Oral, resp. rate 18, height 5\' 7"  (1.702 m), weight 163 lb 6.4 oz (74.118 kg), SpO2 100 %.  General: Well developed, well nourished, female in no acute distress Head: Eyes PERRLA, No xanthomas.   Normocephalic and atraumatic, oropharynx without edema or exudate. Dentition:  Lungs:  Heart: Heart irregular rate and rhythm with S1, S2  murmur. pulses are 2+ all 4 extrem.   Neck: No carotid bruits. No lymphadenopathy.  JVD not elevated. Abdomen: Bowel sounds present, abdomen soft and non-tender without masses or hernias noted. Msk:  No spine or cva tenderness. No weakness, no joint deformities or effusions. Extremities: No clubbing or cyanosis. No edema.  Neuro: Alert and oriented X 3. No focal deficits noted. Psych:  Good affect, responds appropriately Skin: No rashes or lesions noted.  Labs:   Lab Results  Component Value Date   WBC 11.9* 02/22/2015   HGB 8.7* 02/22/2015   HCT 30.9* 02/22/2015   MCV 69.0* 02/22/2015   PLT 424* 02/22/2015    Recent Labs  02/21/15 2044  INR 1.37     Recent Labs Lab 02/21/15 1746 02/22/15 0155  NA 139 141  K 4.6 4.0  CL 106 106  CO2 17* 25  BUN 14 16  CREATININE 0.88 0.73  CALCIUM 9.0 8.8*  PROT 6.9  --   BILITOT 0.7  --   ALKPHOS 138*  --   ALT 18  --   AST 32  --   GLUCOSE 204* 81  ALBUMIN 2.8*  --    No results found for: MG  Recent Labs  02/21/15 2044 02/22/15 0155 02/22/15 0710  TROPONINI <0.03 <0.03 <0.03    Recent Labs  02/21/15 1746  TROPIPOC 0.00   B  NATRIURETIC PEPTIDE  Date/Time Value Ref Range Status  02/21/2015 06:57 PM 193.0* 0.0 - 100.0 pg/mL Final  10/08/2014 04:39 PM 226.8* 0.0 - 100.0 pg/mL Final   Lab Results  Component Value Date   CHOL 156 02/22/2015   HDL 18* 02/22/2015   LDLCALC 112* 02/22/2015   TRIG 130 02/22/2015   TSH  Date/Time Value Ref Range Status  02/22/2015 01:55 AM 1.675 0.350 - 4.500 uIU/mL Final  07/14/2011 08:44 AM 1.84 0.35 - 5.50 uIU/mL Final   Echo: 10/24/2014 (repeat pending) - Left ventricle: The cavity size was normal. Wall thickness was increased in a pattern of mild LVH. Systolic function was normal. The estimated ejection fraction was in the range of 60% to 65%. - Mitral valve: Calcified annulus. Mildly thickened leaflets . - Atrial septum: No defect or patent foramen ovale was identified. - Pulmonary arteries: PA peak pressure: 35 mm Hg (S).  ECG:  02/21/2015 Atrial fib, slow VR Vent. rate 44 BPM PR interval * ms QRS duration 70 ms QT/QTc 489/418 ms P-R-T axes -1 80 -85  Radiology:  Dg Chest 2 View 02/21/2015  CLINICAL DATA:  Chest pain EXAM: CHEST  2 VIEW COMPARISON:  10/08/2014 chest radiograph. FINDINGS: Stable cardiomediastinal silhouette with mild cardiomegaly. New trace bilateral pleural effusions. No pneumothorax. Clear lungs, with no focal lung consolidation and no pulmonary edema. IMPRESSION: 1. Stable mild cardiomegaly.  No pulmonary edema. 2. New trace bilateral pleural effusions. Electronically Signed   By: Ilona Sorrel M.D.   On: 02/21/2015 18:14   Myoview: 06/2010 Impression Exercise Capacity: Lexiscan with no exercise. BP  Response: N/A Clinical Symptoms: There is dyspnea. ECG Impression: No significant ST segment change with Lexiscan. Comparison with Prior Nuclear Study: No previous images available for review. Overall Impression: Probably normal stress nuclear study. There is a small reversible defect in the anterolateral wall which appears to be due to  shifting breast attenuation; however cannot exclude mild ischemia.    ASSESSMENT AND PLAN:   The patient was seen today by Dr Stanford Breed, the patient evaluated and the data reviewed.  Principal Problem:   Chest pain - in the setting of bradycardia - sx resolved once HR improved.  - EF normal by echo 10/2014 - hold off on repeat echo for now as HR is elevated - get MV, can do once HR better controlled.     Symptomatic bradycardia   Atrial fibrillation with slow ventricular response (HCC) - HR improved off rx, but now going too fast - will try adding back Cardizem and metoprolol at decreased doses - will d/c digoxin - May need EP to see in am to see if any other options available as she has tachy-brady  Otherwise, per IM. Active Problems:   Hypertension   Hypercholesterolemia   DM2 (diabetes mellitus, type 2) (HCC)   Rheumatoid arthritis ([place])   Anxiety and depression   Gastroesophageal reflux disease   Hepatitis   Osteoarthritis of hip   Deep vein thrombosis, lower left extremity ([place])  Signed: Lenoard Aden 02/22/2015 9:13 AM Beeper [id number]  Co-Sign MD As above, patient seen and examined. Briefly she is a 69 year old female with past medical history of permanent atrial fibrillation, hypertension, hyperlipidemia, diabetes mellitus, rheumatoid arthritis for evaluation of atrial fibrillation. Patient has been seen recently for elevated heart rates. Her medications have been titrated up. Yesterday she arose from the couch and felt pain in her left breast area described as a brick. It lasted approximately 8 minutes and resolved. No radiation or associated symptoms. She checked her pulse and it was in the 40s. She presented to the emergency room and was noted to be bradycardic and her medications were held. Her heart rate is now 120-140 and cardiology is asked to evaluate. At present patient denies dyspnea, chest pain or syncope. No bleeding. Electrocardiogram shows atrial  fibrillation at a rate of 44, septal infarct and nonspecific ST changes. Enzymes negative.  Plan to continue xarelto (CHADSvasc 4). Echocardiogram in July showed normal LV function. We will discontinue digoxin. Resume Cardizem 60 mg every 6 hours and metoprolol 25 mg twice a day. Follow heart rate and increased medications as needed. If patient demonstrates significant tachybradycardia he would then require pacemaker. Once her rate is controlled plan a nuclear study to screen for ischemia. Kirk Ruths

## 2015-02-22 NOTE — Progress Notes (Signed)
PT Cancellation Note  Patient Details Name: [name] MRN: [id number] DOB: 02-11-1946   Cancelled Treatment:    Reason Eval/Treat Not Completed: Medical issues which prohibited therapy-pt with continued HR irregularities.  Will f/u next date and initiate mobility assessment once pt is medically appropriate.  Thank you,    Herbie Drape 02/22/2015, 2:40 PM

## 2015-02-22 NOTE — Discharge Instructions (Addendum)
Follow with Primary MD KAPLAN,KRISTEN, PA-C in 2-3 days   Get CBC, CMP, 2 view Chest X ray checked  by Primary MD next visit.    Activity: As tolerated with Full fall precautions use walker/cane & assistance as needed   Disposition Home    Diet: Heart Healthy low Carb  For Heart failure patients - Check your Weight same time everyday, if you gain over 2 pounds, or you develop in leg swelling, experience more shortness of breath or chest pain, call your Primary MD immediately. Follow Cardiac Low Salt Diet and 1.5 lit/day fluid restriction.   On your next visit with your primary care physician please Get Medicines reviewed and adjusted.   Please request your Prim.MD to go over all Hospital Tests and Procedure/Radiological results at the follow up, please get all Hospital records sent to your Prim MD by signing hospital release before you go home.   If you experience worsening of your admission symptoms, develop shortness of breath, life threatening emergency, suicidal or homicidal thoughts you must seek medical attention immediately by calling 911 or calling your MD immediately  if symptoms less severe.  You Must read complete instructions/literature along with all the possible adverse reactions/side effects for all the Medicines you take and that have been prescribed to you. Take any new Medicines after you have completely understood and accpet all the possible adverse reactions/side effects.   Do not drive, operating heavy machinery, perform activities at heights, swimming or participation in water activities or provide baby sitting services if your were admitted for syncope or siezures until you have seen by Primary MD or a Neurologist and advised to do so again.  Do not drive when taking Pain medications.    Do not take more than prescribed Pain, Sleep and Anxiety Medications  Special Instructions: If you have smoked or chewed Tobacco  in the last 2 yrs please stop smoking, stop  any regular Alcohol  and or any Recreational drug use.  Wear Seat belts while driving.   Please note  You were cared for by a hospitalist during your hospital stay. If you have any questions about your discharge medications or the care you received while you were in the hospital after you are discharged, you can call the unit and asked to speak with the hospitalist on call if the hospitalist that took care of you is not available. Once you are discharged, your primary care physician will handle any further medical issues. Please note that NO REFILLS for any discharge medications will be authorized once you are discharged, as it is imperative that you return to your primary care physician (or establish a relationship with a primary care physician if you do not have one) for your aftercare needs so that they can reassess your need for medications and monitor your lab values.     Information on my medicine - XARELTO (Rivaroxaban)  This medication education was reviewed with me or my healthcare representative as part of my discharge preparation.  The pharmacist that spoke with me during my hospital stay was:  Myrene Galas, [facility]  Why was Xarelto prescribed for you? Xarelto was prescribed for you to reduce the risk of a blood clot forming that can cause a stroke if you have a medical condition called atrial fibrillation (a type of irregular heartbeat).  What do you need to know about xarelto ? Take your Xarelto ONCE DAILY at the same time every day with your evening meal. If you have difficulty  swallowing the tablet whole, you may crush it and mix in applesauce just prior to taking your dose.  Take Xarelto exactly as prescribed by your doctor and DO NOT stop taking Xarelto without talking to the doctor who prescribed the medication.  Stopping without other stroke prevention medication to take the place of Xarelto may increase your risk of developing a clot that causes a stroke.  Refill  your prescription before you run out.  After discharge, you should have regular check-up appointments with your healthcare provider that is prescribing your Xarelto.  In the future your dose may need to be changed if your kidney function or weight changes by a significant amount.  What do you do if you miss a dose? If you are taking Xarelto ONCE DAILY and you miss a dose, take it as soon as you remember on the same day then continue your regularly scheduled once daily regimen the next day. Do not take two doses of Xarelto at the same time or on the same day.   Important Safety Information A possible side effect of Xarelto is bleeding. You should call your healthcare provider right away if you experience any of the following: ? Bleeding from an injury or your nose that does not stop. ? Unusual colored urine (red or dark brown) or unusual colored stools (red or black). ? Unusual bruising for unknown reasons. ? A serious fall or if you hit your head (even if there is no bleeding).  Some medicines may interact with Xarelto and might increase your risk of bleeding while on Xarelto. To help avoid this, consult your healthcare provider or pharmacist prior to using any new prescription or non-prescription medications, including herbals, vitamins, non-steroidal anti-inflammatory drugs (NSAIDs) and supplements.  This website has more information on Xarelto: [email or web address]

## 2015-02-22 NOTE — Progress Notes (Signed)
Patient Demographics:    [name], is a 69 y.o. female, DOB - 17-Oct-1945, [id number]  Admit date - 02/21/2015   Admitting Physician Ivor Costa, MD  Outpatient Primary MD for the patient is KAPLAN,KRISTEN, PA-C  LOS - 1   Chief Complaint  Patient presents with  . Chest Pain        Subjective:    [name] today has, No headache, No chest pain, No abdominal pain - No Nausea, No new weakness tingling or numbness, No Cough - SOB.    Assessment  & Plan :     1. Atypical chest pain with shortness of breath and bradycardia. Was on 3 rate controlling agents which include diltiazem, metoprolol and digoxin. Cardiology on board. Has ruled out for MI with 3 sets of negative troponin, TSH stable. Per cardiology digoxin is on hold and patient has been placed on low-dose Cardizem and beta blocker. We will defer further management to cardiology.   2. Chronic atrial fibrillation with CHA2DS2-VASc Score is 4 - on xaralto continue, rate controlling agents per cardiology as above. Telemetry monitor. TSH is stable. Had bradycardia upon admission.   3. Dyslipidemia. Currently on fish oil which will be continued.   4. Chronic rheumatoid arthritis. She is currently on combination of Orencia, leflunomide and prednisone all continued.   5. Anxiety and depression. Continue Wellbutrin. No acute issues.   6. GERD. On PPI.   7. History of left lower extremity DVT in the past. On xaralto continue.     8. DM type II. Last A1c 8.4, urgently on sliding scale oral medications held monitor CBGs.  CBG (last 3)   Recent Labs  02/20/15 1034 02/21/15 2026 02/22/15 0746  GLUCAP 80 96 65       Code Status : Full  Family Communication  : None present  Disposition Plan  : Home 1-2 days  Consults  :  Cards  Procedures  :     DVT Prophylaxis  : Xaralto  Lab Results  Component Value Date   PLT 424* 02/22/2015    Inpatient Medications  Scheduled Meds: . beta carotene w/minerals  1 tablet Oral Daily  . buPROPion  150 mg Oral BID  . celecoxib  100 mg Oral BID  . cholecalciferol  2,000 Units Oral Daily  . diltiazem  60 mg Oral 4 times per day  . furosemide  40 mg Oral Daily  . insulin aspart  0-9 Units Subcutaneous TID WC  . leflunomide  20 mg Oral Daily  . metoprolol tartrate  25 mg Oral BID  . omega-3 acid ethyl esters  3 g Oral Daily  . pantoprazole  40 mg Oral Daily  . potassium chloride SA  20 mEq Oral BID  . predniSONE  5 mg Oral Daily  . rivaroxaban  20 mg Oral Q supper  . sodium chloride  3 mL Intravenous Q12H  . vitamin B-12  2,000 mcg Oral Daily   Continuous Infusions:  PRN Meds:.acetaminophen **OR** acetaminophen, alum & mag hydroxide-simeth, hydrALAZINE, HYDROcodone-acetaminophen, morphine injection, nitroGLYCERIN, ondansetron **OR** ondansetron (ZOFRAN) IV  Antibiotics  :    Anti-infectives    None        Objective:   Filed Vitals:   02/21/15 1945 02/21/15 2030  02/21/15 2345 02/22/15 0400  BP: 121/56 145/61 118/89 155/70  Pulse: 65 71 78 96  Temp:  97.7 F (36.5 C) 98.7 F (37.1 C) 98 F (36.7 C)  TempSrc:      Resp: 25 19 15 18   Height:  5\' 7"  (1.702 m)    Weight:  74.526 kg (164 lb 4.8 oz)  74.118 kg (163 lb 6.4 oz)  SpO2: 97% 100% 99% 100%    Wt Readings from Last 3 Encounters:  02/22/15 74.118 kg (163 lb 6.4 oz)  02/20/15 72.122 kg (159 lb)  12/04/14 73.846 kg (162 lb 12.8 oz)     Intake/Output Summary (Last 24 hours) at 02/22/15 0951 Last data filed at 02/22/15 0423  Gross per 24 hour  Intake    360 ml  Output    850 ml  Net   -490 ml     Physical Exam  Awake Alert, Oriented X 2, No new F.N deficits, Normal affect .AT,PERRAL Supple Neck,No JVD, No cervical lymphadenopathy appriciated.  Symmetrical Chest wall movement,  Good air movement bilaterally, CTAB RRR,No Gallops,Rubs or new Murmurs, No Parasternal Heave +ve B.Sounds, Abd Soft, No tenderness, No organomegaly appriciated, No rebound - guarding or rigidity. No Cyanosis, Clubbing or edema, No new Rash or bruise     Data Review:   Micro Results Recent Results (from the past 240 hour(s))  MRSA PCR Screening     Status: None   Collection Time: 02/21/15  8:36 PM  Result Value Ref Range Status   MRSA by PCR NEGATIVE NEGATIVE Final    Comment:        The GeneXpert MRSA Assay (FDA approved for NASAL specimens only), is one component of a comprehensive MRSA colonization surveillance program. It is not intended to diagnose MRSA infection nor to guide or monitor treatment for MRSA infections.     Radiology Reports Dg Chest 2 View  02/21/2015  CLINICAL DATA:  Chest pain EXAM: CHEST  2 VIEW COMPARISON:  10/08/2014 chest radiograph. FINDINGS: Stable cardiomediastinal silhouette with mild cardiomegaly. New trace bilateral pleural effusions. No pneumothorax. Clear lungs, with no focal lung consolidation and no pulmonary edema. IMPRESSION: 1. Stable mild cardiomegaly.  No pulmonary edema. 2. New trace bilateral pleural effusions. Electronically Signed   By: Ilona Sorrel M.D.   On: 02/21/2015 18:14     CBC  Recent Labs Lab 02/21/15 1746 02/22/15 0155  WBC 11.6* 11.9*  HGB 9.4* 8.7*  HCT 33.6* 30.9*  PLT 476* 424*  MCV 70.0* 69.0*  MCH 19.6* 19.4*  MCHC 28.0* 28.2*  RDW 18.7* 18.4*  LYMPHSABS 2.3  --   MONOABS 0.7  --   EOSABS 0.3  --   BASOSABS 0.0  --     Chemistries   Recent Labs Lab 02/21/15 1746 02/22/15 0155  NA 139 141  K 4.6 4.0  CL 106 106  CO2 17* 25  GLUCOSE 204* 81  BUN 14 16  CREATININE 0.88 0.73  CALCIUM 9.0 8.8*  AST 32  --   ALT 18  --   ALKPHOS 138*  --   BILITOT 0.7  --    ------------------------------------------------------------------------------------------------------------------ estimated creatinine  clearance is 68.8 mL/min (by C-G formula based on Cr of 0.73). ------------------------------------------------------------------------------------------------------------------ No results for input(s): HGBA1C in the last 72 hours. ------------------------------------------------------------------------------------------------------------------  Recent Labs  02/22/15 0155  CHOL 156  HDL 18*  LDLCALC 112*  TRIG 130  CHOLHDL 8.7   ------------------------------------------------------------------------------------------------------------------  Recent Labs  02/22/15 0155  TSH 1.675   ------------------------------------------------------------------------------------------------------------------  No results for input(s): VITAMINB12, FOLATE, FERRITIN, TIBC, IRON, RETICCTPCT in the last 72 hours.  Coagulation profile  Recent Labs Lab 02/21/15 2044  INR 1.37    No results for input(s): DDIMER in the last 72 hours.  Cardiac Enzymes  Recent Labs Lab 02/21/15 2044 02/22/15 0155 02/22/15 0710  TROPONINI <0.03 <0.03 <0.03   ------------------------------------------------------------------------------------------------------------------ Invalid input(s): POCBNP   Time Spent in minutes  35   Raigan Baria K M.D on 02/22/2015 at 9:51 AM  Between 7am to 7pm - Pager - [phone]  After 7pm go to [email or web address] - password [facility]  [facility] -  Office  [phone]

## 2015-02-23 ENCOUNTER — Encounter (HOSPITAL_COMMUNITY): Payer: Self-pay | Admitting: Gastroenterology

## 2015-02-23 DIAGNOSIS — K219 Gastro-esophageal reflux disease without esophagitis: Secondary | ICD-10-CM

## 2015-02-23 DIAGNOSIS — R0789 Other chest pain: Secondary | ICD-10-CM

## 2015-02-23 LAB — GLUCOSE, CAPILLARY: GLUCOSE-CAPILLARY: 123 mg/dL — AB (ref 65–99)

## 2015-02-23 LAB — HEMOGLOBIN A1C
Hgb A1c MFr Bld: 7.2 % — ABNORMAL HIGH (ref 4.8–5.6)
Mean Plasma Glucose: 160 mg/dL

## 2015-02-23 MED ORDER — METOPROLOL TARTRATE 50 MG PO TABS
50.0000 mg | ORAL_TABLET | Freq: Two times a day (BID) | ORAL | Status: DC
  Administered 2015-02-23: 50 mg via ORAL
  Filled 2015-02-23: qty 1

## 2015-02-23 MED ORDER — DILTIAZEM HCL ER COATED BEADS 240 MG PO CP24: 240.0000 mg | ORAL_CAPSULE | Freq: Every day | ORAL | Status: DC

## 2015-02-23 MED ORDER — METOPROLOL TARTRATE 50 MG PO TABS: 50.0000 mg | ORAL_TABLET | Freq: Two times a day (BID) | ORAL | Status: DC

## 2015-02-23 NOTE — Plan of Care (Signed)
Problem: Activity: Goal: Ability to tolerate increased activity will improve Outcome: Completed/Met Date Met:  02/23/15 Pt tolerating activity well. No c/o chest pain with activity.

## 2015-02-23 NOTE — Plan of Care (Signed)
Problem: Cardiac: Goal: Ability to achieve and maintain adequate cardiopulmonary perfusion will improve Outcome: Completed/Met Date Met:  02/23/15 Pt educated on medication compliance. No s/s of inadequate perfusion.

## 2015-02-23 NOTE — Progress Notes (Signed)
       Patient Name: [name] Date of Encounter: 02/23/2015    SUBJECTIVE:Feels well while lying. Denies dyspnea. Presenting symptoms were atypical for ischemia - only present when standing with immediate relief when sitting or lying.  TELEMETRY:  AF with moderate VR Filed Vitals:   02/22/15 2000 02/22/15 2330 02/23/15 0400 02/23/15 0755  BP: 145/58 124/84 127/62 125/80  Pulse: 55  68 58  Temp: 98.3 F (36.8 C) 98.2 F (36.8 C) 97.8 F (36.6 C) 97.8 F (36.6 C)  TempSrc:  Oral  Oral  Resp: 18 19 18 18   Height:      Weight:   71.079 kg (156 lb 11.2 oz)   SpO2: 96% 97% 97% 98%    Intake/Output Summary (Last 24 hours) at 02/23/15 0823 Last data filed at 02/23/15 [id number]  Gross per 24 hour  Intake    903 ml  Output   1675 ml  Net   -772 ml   LABS: Basic Metabolic Panel:  Recent Labs  02/21/15 1746 02/22/15 0155  NA 139 141  K 4.6 4.0  CL 106 106  CO2 17* 25  GLUCOSE 204* 81  BUN 14 16  CREATININE 0.88 0.73  CALCIUM 9.0 8.8*   CBC:  Recent Labs  02/21/15 1746 02/22/15 0155  WBC 11.6* 11.9*  NEUTROABS 8.3*  --   HGB 9.4* 8.7*  HCT 33.6* 30.9*  MCV 70.0* 69.0*  PLT 476* 424*   Cardiac Enzymes:  Recent Labs  02/21/15 2044 02/22/15 0155 02/22/15 0710  TROPONINI <0.03 <0.03 <0.03   BNP: Invalid input(s): POCBNP Hemoglobin A1C: No results for input(s): HGBA1C in the last 72 hours. Fasting Lipid Panel:  Recent Labs  02/22/15 0155  CHOL 156  HDL 18*  LDLCALC 112*  TRIG 130  CHOLHDL 8.7    Radiology/Studies:  CXR IMPRESSION: 1. Stable mild cardiomegaly. No pulmonary edema. 2. New trace bilateral pleural effusions.  Physical Exam: Blood pressure 125/80, pulse 58, temperature 97.8 F (36.6 C), temperature source Oral, resp. rate 18, height 5\' 7"  (1.702 m), weight 71.079 kg (156 lb 11.2 oz), SpO2 98 %. Weight change: -1.497 kg (-3 lb 4.8 oz)  Wt Readings from Last 3 Encounters:  02/23/15 71.079 kg (156 lb 11.2 oz)  02/20/15 72.122  kg (159 lb)  12/04/14 73.846 kg (162 lb 12.8 oz)    Lying flat.  Pale skin Chest is clear Cardiac exam with IIRR. No murmur No edema  ASSESSMENT:  1. Atrial fib with slow VR, now resolved with med adjustment. 2. Chest discomfort with atypical features. MI ruled out. 3. Severe anemia 4. Chronic anticoagulation therapy with Xarelto  Plan:  1. Ambulate 2. Watch hemoglobin 3. If no discomfort with activity, may be able to do nuclear as OP. 4. Will need more Metoprolol and diltiazem than currently ordered.  [name] 02/23/2015, 8:23 AM

## 2015-02-23 NOTE — Plan of Care (Signed)
Problem: Education: Goal: Knowledge of disease or condition will improve Outcome: Completed/Met Date Met:  02/23/15 Pt educated on risk factors, plan of care, and medication. Pt verbalized understanding. No questions/concerns at this time.

## 2015-02-23 NOTE — Evaluation (Signed)
Physical Therapy Evaluation and Discharge Patient Details Name: [name] MRN: [id number] DOB: 1946-04-02 Today's Date: 02/23/2015   History of Present Illness  Pt is a 69 y/o F who presented w/ chest pain.  Her PMH includes a-fib, severe anemia, chest pain, RA, hepatitis, carpal tunnel syndrome, edema, memory loss, hiatal hernia, depression.  Clinical Impression  Pt admitted with above diagnosis. Pt at mod I level of assist and has significant other at home w/ her 90% of the time.  Not acute skilled PT needs identified.  PT is signing off, thank you for this order.    Follow Up Recommendations No PT follow up    Equipment Recommendations  None recommended by PT    Recommendations for Other Services       Precautions / Restrictions Precautions Precautions: Fall Restrictions Weight Bearing Restrictions: No      Mobility  Bed Mobility Overal bed mobility: Independent             General bed mobility comments: Pt in bathroom upon PT arrival  Transfers Overall transfer level: Modified independent Equipment used: None Transfers: Sit to/from Stand Sit to Stand: Modified independent (Device/Increase time)         General transfer comment: Very min instability noted but otherwise Ind  Ambulation/Gait Ambulation/Gait assistance: Modified independent (Device/Increase time) Ambulation Distance (Feet): 200 Feet Assistive device: None Gait Pattern/deviations: Step-through pattern;Antalgic   Gait velocity interpretation: at or above normal speed for age/gender General Gait Details: bil trendelenburg present (pt attributes this to h/o bil hip replacements).  Min instability 2/2 this but pt otherwise Ind  Financial trader Rankin (Stroke Patients Only)       Balance Overall balance assessment: Modified Independent Sitting-balance support: No upper extremity supported;Feet supported Sitting balance-Leahy Scale: Normal     Standing balance support: No upper extremity supported;During functional activity Standing balance-Leahy Scale: Good                   Standardized Balance Assessment Standardized Balance Assessment : Dynamic Gait Index   Dynamic Gait Index Level Surface: Mild Impairment Change in Gait Speed: Mild Impairment Gait with Horizontal Head Turns: Mild Impairment Gait with Vertical Head Turns: Mild Impairment Gait and Pivot Turn: Normal Step Over Obstacle: Normal Step Around Obstacles: Mild Impairment       Pertinent Vitals/Pain Pain Assessment: No/denies pain    Home Living Family/patient expects to be discharged to:: Private residence Living Arrangements: Spouse/significant other Available Help at Discharge: Available PRN/intermittently;Family ([place] 90% of the time) Type of Home: Mobile home Home Access: Stairs to enter Entrance Stairs-Rails: Right;Left;Can reach both Entrance Stairs-Number of Steps: 3 Home Layout: One level Home Equipment: Walker - 2 wheels;Walker - 4 wheels;Cane - quad;Cane - single point;Bedside commode;Shower seat;Grab bars - tub/shower Additional Comments: significant other is 69 y/o and is at home most the time    Prior Function Level of Independence: Independent               Hand Dominance   Dominant Hand: Right    Extremity/Trunk Assessment   Upper Extremity Assessment: Defer to OT evaluation           Lower Extremity Assessment: Overall WFL for tasks assessed      Cervical / Trunk Assessment: Normal  Communication   Communication: No difficulties  Cognition Arousal/Alertness: Awake/alert Behavior During Therapy: WFL for tasks assessed/performed Overall Cognitive Status: Within Functional  Limits for tasks assessed                      General Comments      Exercises        Assessment/Plan    PT Assessment Patent does not need any further PT services  PT Diagnosis Abnormality of gait   PT Problem  List    PT Treatment Interventions     PT Goals (Current goals can be found in the Care Plan section) Acute Rehab PT Goals Patient Stated Goal: to go home PT Goal Formulation: All assessment and education complete, DC therapy    Frequency     Barriers to discharge        Co-evaluation               End of Session   Activity Tolerance: Patient tolerated treatment well Patient left: in bed;with call bell/phone within reach (sitting EOB) Nurse Communication: Mobility status         Time: [id number] PT Time Calculation (min) (ACUTE ONLY): 11 min   Charges:   PT Evaluation $Initial PT Evaluation Tier I: 1 Procedure     PT G CodesJoslyn Hy PT, DPT [phone] Pager: [phone] 02/23/2015, 1:07 PM

## 2015-02-23 NOTE — Plan of Care (Signed)
Problem: Fluid Volume: Goal: Ability to maintain a balanced intake and output will improve Outcome: Completed/Met Date Met:  02/23/15 Pt has adequate intake and output. Pt educated on importance fluid volume balance.

## 2015-02-23 NOTE — Plan of Care (Signed)
Problem: Physical Regulation: Goal: Ability to maintain clinical measurements within normal limits will improve Outcome: Completed/Met Date Met:  02/23/15 Pt tolerating treatment well. No complaints at this time.

## 2015-02-23 NOTE — Evaluation (Signed)
Occupational Therapy Evaluation Patient Details Name: YUZUKI SPLAIN MRN: [id number] DOB: Jul 19, 1945 Today's Date: 02/23/2015    History of Present Illness Caryl L Albee is a 69 y.o. year old female with a history of atrial fibrillation on Xarelto, normal EF by echo 2016, hypertension, hyperlipidemia, diabetes mellitus, GERD, depression, anxiety, rheumatoid arthritis, hepatitis, and DVT (on Pradaxa), who was admitted 11/19 with chest pain, shortness of breath and bradycardia.   Clinical Impression   Patient presenting with deconditioning secondary to above. Patient independent to mod I PTA. Patient currently functioning at an overall supervision level. Patient will benefit from acute OT to increase overall independence in the areas of ADLs, functional mobility, and overall safety in order to safely discharge home with significant other.   Patient's HR ranged from 83-109 during session, fluctuating at rest and during activity.   Discussed with nurse to assist patient with functional mobility and ambulation throughout the day for patient's overall cardiovascular health, endurance, strength, and mobility.     Follow Up Recommendations  No OT follow up;Supervision - Intermittent    Equipment Recommendations  None recommended by OT    Recommendations for Other Services  None at this time   Precautions / Restrictions Precautions Precautions: Fall Restrictions Weight Bearing Restrictions: No    Mobility Bed Mobility Overal bed mobility: Independent  Transfers Overall transfer level: Needs assistance Equipment used: None Transfers: Sit to/from Stand Sit to Stand: Supervision   General transfer comment: Supervision for safety    Balance Overall balance assessment: Needs assistance Sitting-balance support: No upper extremity supported;Feet supported Sitting balance-Leahy Scale: Normal     Standing balance support: No upper extremity supported;During functional  activity Standing balance-Leahy Scale: Good      ADL Overall ADL's : Needs assistance/impaired Eating/Feeding: Set up;Sitting   Grooming: Supervision/safety;Standing   Upper Body Bathing: Set up;Sitting   Lower Body Bathing: Supervison/ safety;Sit to/from stand   Upper Body Dressing : Set up;Sitting   Lower Body Dressing: Supervision/safety;Sit to/from stand   Toilet Transfer: Supervision/safety;Comfort height toilet;Ambulation   Toileting- Clothing Manipulation and Hygiene: Supervision/safety;Sit to/from Nurse, children's Details (indicate cue type and reason): did not occur Functional mobility during ADLs: Supervision/safety General ADL Comments: No AD used, pt reports she uses a cane for community mobility. Pt overall supervision for ADLs, set goals for independent to mod I.     Pertinent Vitals/Pain Pain Assessment: No/denies pain     Hand Dominance Right   Extremity/Trunk Assessment Upper Extremity Assessment Upper Extremity Assessment: Overall WFL for tasks assessed (arthritis in hands, but functional)   Lower Extremity Assessment Lower Extremity Assessment: Defer to PT evaluation   Cervical / Trunk Assessment Cervical / Trunk Assessment: Normal   Communication Communication Communication: No difficulties   Cognition Arousal/Alertness: Awake/alert Behavior During Therapy: WFL for tasks assessed/performed Overall Cognitive Status: Within Functional Limits for tasks assessed              Home Living Family/patient expects to be discharged to:: Private residence Living Arrangements: Spouse/significant other Available Help at Discharge: Available PRN/intermittently;Family Type of Home: Mobile home Home Access: Stairs to enter Entrance Stairs-Number of Steps: 3 Entrance Stairs-Rails: Right;Left;Can reach both Home Layout: One level     Bathroom Shower/Tub: Tub/shower unit;Curtain   Bathroom Toilet: Handicapped height     Home  Equipment: Environmental consultant - 2 wheels;Walker - 4 wheels;Cane - quad;Cane - single point;Bedside commode;Shower seat;Grab bars - tub/shower   Additional Comments: significant other is 69 y/o and is at  home most the time      Prior Functioning/Environment Level of Independence: Independent       OT Diagnosis: Generalized weakness   OT Problem List: Decreased activity tolerance;Decreased safety awareness   OT Treatment/Interventions: Self-care/ADL training;Therapeutic exercise;Energy conservation;DME and/or AE instruction;Therapeutic activities;Patient/family education    OT Goals(Current goals can be found in the care plan section) Acute Rehab OT Goals Patient Stated Goal: figure out what's going on OT Goal Formulation: With patient Time For Goal Achievement: 03/09/15 Potential to Achieve Goals: Good ADL Goals Pt Will Perform Grooming: standing;Independently Pt Will Transfer to Toilet: with modified independence;ambulating Pt Will Perform Tub/Shower Transfer: Tub transfer;shower seat;ambulating;with modified independence Additional ADL Goal #1: Pt will be independent to mod I with functional mobility/ambulation Additional ADL Goal #2: Pt will be educated on energy conservation techniques and pt will be able to verbalize at least 3 techniques independently   OT Frequency: Min 2X/week   Barriers to D/C: None known at this time   End of Session Activity Tolerance: Patient tolerated treatment well Patient left: in chair;with call bell/phone within reach   Time: 0941-1005 OT Time Calculation (min): 24 min Charges:  OT General Charges $OT Visit: 1 Procedure OT Evaluation $Initial OT Evaluation Tier I: 1 Procedure  Britt Petroni , MS, OTR/L, CLT Pager: [phone]  02/23/2015, 10:09 AM

## 2015-02-23 NOTE — Progress Notes (Signed)
UR Completed Lekia Nier Graves-Bigelow, RN,BSN [phone]  

## 2015-02-23 NOTE — Progress Notes (Signed)
Pt ambulated approximately 317ft. Pt tolerated ambulation well. No c/o chest pain. VSS. Will continue to monitor.   Ruben Reason, RN

## 2015-02-23 NOTE — Plan of Care (Signed)
Problem: Tissue Perfusion: Goal: Risk factors for ineffective tissue perfusion will decrease Outcome: Completed/Met Date Met:  02/23/15 Pt educated on VTE prophylaxis and importance of medication compliance. Pt verbalized understanding.

## 2015-02-23 NOTE — Plan of Care (Signed)
Problem: Activity: Goal: Risk for activity intolerance will decrease Outcome: Completed/Met Date Met:  02/23/15 Pt tolerating activity well. No c/o chest pain at this time. Pt verbalized understanding of education on activity.

## 2015-02-23 NOTE — Plan of Care (Signed)
Problem: Safety: Goal: Ability to remain free from injury will improve Outcome: Completed/Met Date Met:  02/23/15 Pt educated on safety measures put into place. Pt verbalized understanding. Pt educated on how to call RN and tech with phone. Call bell within reach.

## 2015-02-23 NOTE — Plan of Care (Signed)
Problem: Health Behavior/Discharge Planning: Goal: Ability to manage health-related needs will improve Outcome: Completed/Met Date Met:  02/23/15 Pt educated on importance of being compliant with medications. Pt verbalized understanding.  Assessed for discharge needs. Pt has no complaints at this time.

## 2015-02-23 NOTE — Discharge Summary (Signed)
[name], is a 69 y.o. female  DOB Jan 04, 1946  MRN [id number].  Admission date:  02/21/2015  Admitting Physician  Ivor Costa, MD  Discharge Date:  02/23/2015   Primary MD  Tula Nakayama  Recommendations for primary care physician for things to follow:   Monitor heart rate and blood pressure closely and adjust medications as needed. Close outpatient cardiology follow-up for outpatient stress test.   Admission Diagnosis  Bradycardia [R00.1]   Discharge Diagnosis  Bradycardia [R00.1]     Principal Problem:   Chest pain Active Problems:   Hypertension   Hypercholesterolemia   DM2 (diabetes mellitus, type 2) (HCC)   Rheumatoid arthritis (HCC)   Atrial fibrillation with slow ventricular response (HCC)   Anxiety and depression   Gastroesophageal reflux disease   Hepatitis   Osteoarthritis of hip   Deep vein thrombosis, lower left extremity (HCC)   Symptomatic bradycardia      Past Medical History  Diagnosis Date  . Chest pain   . Atrial fibrillation ([place])   . Anemia     hx of  . Hypertension   . Rheumatoid arthritis(714.0)   . Gastroesophageal reflux disease   . Hyperlipidemia   . Hepatitis 1970  . Jaundice     age 63  . Actinic keratosis   . Atrial fibrillation ([place])   . Carpal tunnel syndrome   . Edema   . Hiatal hernia   . Memory loss   . Thyroid disease     multi nodular goiter  . Type II diabetes mellitus ([place])   . Anxiety   . Depression     Past Surgical History  Procedure Laterality Date  . Hand surgery  1995 and 1996    artificial joints both hands  . Cataract extraction Left   . Total hip arthroplasty Left 2009  . Total hip arthroplasty Right 04/03/2013    Procedure: RIGHT TOTAL HIP ARTHROPLASTY ANTERIOR APPROACH;  Surgeon: Gearlean Alf, MD;  Location: [facility];   Service: Orthopedics;  Laterality: Right;  . Joint replacement    . Esophagogastroduodenoscopy (egd) with propofol N/A 02/20/2015    Procedure: ESOPHAGOGASTRODUODENOSCOPY (EGD) WITH PROPOFOL;  Surgeon: Carol Ada, MD;  Location: [facility];  Service: Endoscopy;  Laterality: N/A;  . Colonoscopy with propofol N/A 02/20/2015    Procedure: COLONOSCOPY WITH PROPOFOL;  Surgeon: Carol Ada, MD;  Location: [facility];  Service: Endoscopy;  Laterality: N/A;       HPI  from the history and physical done on the day of admission:   [name] is a 69 y.o. female with PMH of atrial fibrillation on Xarelto, hypertension, hyperlipidemia, diabetes mellitus, GERD, depression, anxiety, rheumatoid arthritis, hepatitis, DVT, who presents with chest pain, shortness of breath and bradycardia.  Pt states that she was in chair and had sudden onset of chest pain and SOB at about 4:30 PM. The episode lasted for a few minutes, then resolved spontaneously. She checked her heart rate which was 44. Then she had 2 more episodes chest pain  and shortness of breath, both of which lasted for a few minutes. Per EMS, her HR was down to ~20. Her chest pain was located in the left side of chest, pressure-like, nonradiating and was moderate. Patient does not have fever, chills, cough. She underwent EGD which was normal and colonoscopy which showed nonbleeding cecal AVM, 2 polyps and diverticula on 02/20/15. Per GI, Dr. Ulyses Amor note, pt can continue her Xarelto. She is on multiple agents for her Afib, including digoxin, cardizem, metoprolol. When I saw pt in ED, her chest pain and shortness of breath have resolved.  In ED, patient was found to have bradycardia, negative troponin, WBC 11.6, temperature normal, electrolytes and renal function okay. Chest x-ray showed trace amount of bilateral pleural effusion without infiltration, digoxin level 0.5. Patient's admitted to inpatient for further evaluation and treatment. EDP  consulted cardiology, Dr. Radford Pax, who recommended to discontinue her blood pressure medications and reconsult cardiology in morning if patient is still symptomatic.       Hospital Course:     1. Atypical chest pain with shortness of breath and bradycardia. Was on 3 rate controlling agents which include diltiazem, metoprolol and digoxin. Cardiology on board. Has ruled out for MI with 3 sets of negative troponin, TSH stable. Per cardiology discharge with outpatient stress test, patient is completely if pain-free and eager to go home.   2. Chronic atrial fibrillation with CHA2DS2-VASc Score is 4 - on xaralto continue, rate controlling agents per cardiology as below have been adjusted, ambulated in the hallway without any symptoms, heart rate stable. TSH stable. Per cardiology will be discharged with outpatient urology follow-up and possible outpatient stress test. Request PCP to monitor heart rate closely and adjust rate controlling medications as needed. For now digoxin has been stopped, beta blocker and Cardizem doses have been cut down.   3. Dyslipidemia. Currently on fish oil which will be continued.   4. Chronic rheumatoid arthritis. She is currently on combination of Orencia, leflunomide and prednisone all continued.   5. Anxiety and depression. Continue Wellbutrin. No acute issues.   6. GERD. On PPI.   7. History of left lower extremity DVT in the past. On xaralto continue.    8. DM type II. Last A1c 8.4 - same home regimen follow with PCP for glycemic control.    Discharge Condition: Stable  Follow UP  Follow-up Information    Follow up with KAPLAN,KRISTEN, PA-C. Schedule an appointment as soon as possible for a visit in 3 days.   Specialty:  Family Medicine   Contact information:   [address] [place] [address] [phone]       Follow up with Kirk Ruths, MD. Schedule an appointment as soon as possible for a visit in 1 week.   Specialty:  Cardiology    Why:  Afib   Contact information:   [address] [place] [address] [phone]       Follow up with KAPLAN,KRISTEN, PA-C. Schedule an appointment as soon as possible for a visit in 2 days.   Specialty:  Family Medicine   Contact information:   [place] [address] [phone]        Consults obtained - Cards  Diet and Activity recommendation: See Discharge Instructions below  Discharge Instructions       Discharge Instructions    Discharge instructions    Complete by:  As directed   Follow with Primary MD KAPLAN,KRISTEN, PA-C in 2-3 days  Get CBC, CMP, 2 view Chest X ray checked  by Primary MD next visit.    Activity: As tolerated with Full fall precautions use walker/cane & assistance as needed   Disposition Home    Diet: Heart Healthy low Carb  For Heart failure patients - Check your Weight same time everyday, if you gain over 2 pounds, or you develop in leg swelling, experience more shortness of breath or chest pain, call your Primary MD immediately. Follow Cardiac Low Salt Diet and 1.5 lit/day fluid restriction.   On your next visit with your primary care physician please Get Medicines reviewed and adjusted.   Please request your Prim.MD to go over all Hospital Tests and Procedure/Radiological results at the follow up, please get all Hospital records sent to your Prim MD by signing hospital release before you go home.   If you experience worsening of your admission symptoms, develop shortness of breath, life threatening emergency, suicidal or homicidal thoughts you must seek medical attention immediately by calling 911 or calling your MD immediately  if symptoms less severe.  You Must read complete instructions/literature along with all the possible adverse reactions/side effects for all the Medicines you take and that have been prescribed to you. Take any new Medicines after you have completely understood and accpet all  the possible adverse reactions/side effects.   Do not drive, operating heavy machinery, perform activities at heights, swimming or participation in water activities or provide baby sitting services if your were admitted for syncope or siezures until you have seen by Primary MD or a Neurologist and advised to do so again.  Do not drive when taking Pain medications.    Do not take more than prescribed Pain, Sleep and Anxiety Medications  Special Instructions: If you have smoked or chewed Tobacco  in the last 2 yrs please stop smoking, stop any regular Alcohol  and or any Recreational drug use.  Wear Seat belts while driving.   Please note  You were cared for by a hospitalist during your hospital stay. If you have any questions about your discharge medications or the care you received while you were in the hospital after you are discharged, you can call the unit and asked to speak with the hospitalist on call if the hospitalist that took care of you is not available. Once you are discharged, your primary care physician will handle any further medical issues. Please note that NO REFILLS for any discharge medications will be authorized once you are discharged, as it is imperative that you return to your primary care physician (or establish a relationship with a primary care physician if you do not have one) for your aftercare needs so that they can reassess your need for medications and monitor your lab values.     Increase activity slowly    Complete by:  As directed              Discharge Medications       Medication List    STOP taking these medications        celecoxib 100 MG capsule  Commonly known as:  CELEBREX     digoxin 0.125 MG tablet  Commonly known as:  LANOXIN     metoprolol succinate 100 MG 24 hr tablet  Commonly known as:  TOPROL-XL      TAKE these medications        buPROPion 150 MG 12 hr tablet  Commonly known as:  WELLBUTRIN SR  Take 150 mg by  mouth 2 (two)  times daily.     diltiazem 240 MG 24 hr capsule  Commonly known as:  CARDIZEM CD  Take 1 capsule (240 mg total) by mouth daily.     esomeprazole 40 MG capsule  Commonly known as:  NEXIUM  Take 40 mg by mouth daily.     furosemide 40 MG tablet  Commonly known as:  LASIX  TAKE 1 TABLET BY MOUTH EVERY DAY     glimepiride 2 MG tablet  Commonly known as:  AMARYL  Take 2 mg by mouth every morning.     HYDROcodone-acetaminophen 5-325 MG tablet  Commonly known as:  NORCO/VICODIN  Take 1 tablet by mouth every 4 (four) hours as needed.     KLOR-CON 10 10 MEQ tablet  Generic drug:  potassium chloride  TAKE 2 TABLETS BY MOUTH TWICE A DAY     leflunomide 20 MG tablet  Commonly known as:  ARAVA  Take 20 mg by mouth daily.     metFORMIN 500 MG 24 hr tablet  Commonly known as:  GLUCOPHAGE-XR  Take 1,000 mg by mouth 2 (two) times daily.     metoprolol 50 MG tablet  Commonly known as:  LOPRESSOR  Take 1 tablet (50 mg total) by mouth 2 (two) times daily.     nitroGLYCERIN 0.4 MG SL tablet  Commonly known as:  NITROSTAT  Place 0.4 mg under the tongue every 5 (five) minutes as needed for chest pain (chest pain).     Omega 3 1200 MG Caps  Take 1 capsule by mouth daily.     ORENCIA 125 MG/ML Sosy  Generic drug:  Abatacept  Inject 125 mg into the skin every 7 (seven) days. Either Wed or Thurs.     predniSONE 5 MG tablet  Commonly known as:  DELTASONE  Take 1 tablet (5 mg total) by mouth daily. Take 2 tablet (10mg ) daily for 2 days before resume 5mg  daily thereafter     PRESERVISION AREDS Caps  Take 1 capsule by mouth 2 (two) times daily.     rivaroxaban 20 MG Tabs tablet  Commonly known as:  XARELTO  Take 1 tablet (20 mg total) by mouth daily with supper.     VITAMIN B 12 PO  Take 2,000 mcg by mouth daily.     Vitamin D3 2000 UNITS Tabs  Take 2,000 Units by mouth daily.        Major procedures and Radiology Reports - PLEASE review detailed and final reports for all  details, in brief -     Dg Chest 2 View  02/21/2015  CLINICAL DATA:  Chest pain EXAM: CHEST  2 VIEW COMPARISON:  10/08/2014 chest radiograph. FINDINGS: Stable cardiomediastinal silhouette with mild cardiomegaly. New trace bilateral pleural effusions. No pneumothorax. Clear lungs, with no focal lung consolidation and no pulmonary edema. IMPRESSION: 1. Stable mild cardiomegaly.  No pulmonary edema. 2. New trace bilateral pleural effusions. Electronically Signed   By: Ilona Sorrel M.D.   On: 02/21/2015 18:14    Micro Results      Recent Results (from the past 240 hour(s))  MRSA PCR Screening     Status: None   Collection Time: 02/21/15  8:36 PM  Result Value Ref Range Status   MRSA by PCR NEGATIVE NEGATIVE Final    Comment:        The GeneXpert MRSA Assay (FDA approved for NASAL specimens only), is one component of a comprehensive MRSA colonization surveillance program. It is not  intended to diagnose MRSA infection nor to guide or monitor treatment for MRSA infections.    Today   Subjective    [name] today has no headache,no chest abdominal pain,no new weakness tingling or numbness, feels much better wants to go home today    Objective   Blood pressure 125/80, pulse 58, temperature 97.8 F (36.6 C), temperature source Oral, resp. rate 18, height 5\' 7"  (1.702 m), weight 71.079 kg (156 lb 11.2 oz), SpO2 98 %.   Intake/Output Summary (Last 24 hours) at 02/23/15 1056 Last data filed at 02/23/15 0936  Gross per 24 hour  Intake    780 ml  Output   2075 ml  Net  -1295 ml    Exam Awake Alert, Oriented x 3, No new F.N deficits, Normal affect [place].AT,PERRAL Supple Neck,No JVD, No cervical lymphadenopathy appriciated.  Symmetrical Chest wall movement, Good air movement bilaterally, CTAB iRRR,No Gallops,Rubs or new Murmurs, No Parasternal Heave +ve B.Sounds, Abd Soft, Non tender, No organomegaly appriciated, No rebound -guarding or rigidity. No Cyanosis, Clubbing or  edema, No new Rash or bruise   Data Review   CBC w Diff: Lab Results  Component Value Date   WBC 11.9* 02/22/2015   WBC 11.0* 07/08/2014   HGB 8.7* 02/22/2015   HGB 12.2 07/08/2014   HCT 30.9* 02/22/2015   HCT 39.7 07/08/2014   PLT 424* 02/22/2015   PLT 308 07/08/2014   LYMPHOPCT 20 02/21/2015   LYMPHOPCT 22.0 07/08/2014   MONOPCT 6 02/21/2015   MONOPCT 9.7 07/08/2014   EOSPCT 3 02/21/2015   EOSPCT 3.0 07/08/2014   BASOPCT 0 02/21/2015   BASOPCT 0.5 07/08/2014    CMP: Lab Results  Component Value Date   NA 141 02/22/2015   NA 141 07/08/2014   K 4.0 02/22/2015   K 4.1 07/08/2014   CL 106 02/22/2015   CO2 25 02/22/2015   CO2 26 07/08/2014   BUN 16 02/22/2015   BUN 14.1 07/08/2014   CREATININE 0.73 02/22/2015   CREATININE 0.7 07/08/2014   PROT 6.9 02/21/2015   PROT 7.4 07/08/2014   ALBUMIN 2.8* 02/21/2015   ALBUMIN 3.4* 07/08/2014   BILITOT 0.7 02/21/2015   BILITOT 0.47 07/08/2014   ALKPHOS 138* 02/21/2015   ALKPHOS 115 07/08/2014   AST 32 02/21/2015   AST 19 07/08/2014   ALT 18 02/21/2015   ALT 22 07/08/2014  . Lab Results  Component Value Date   TSH 1.675 02/22/2015     Total Time in preparing paper work, data evaluation and todays exam - 35 minutes  Thurnell Lose M.D on 02/23/2015 at 10:56 AM  [facility]   Office  [phone]

## 2015-02-23 NOTE — Plan of Care (Signed)
Problem: Physical Regulation: Goal: Will remain free from infection Outcome: Completed/Met Date Met:  02/23/15 Pt educated on infection prevention. Pt verbalized understanding.      

## 2015-02-23 NOTE — Plan of Care (Signed)
Problem: Health Behavior/Discharge Planning: Goal: Ability to safely manage health-related needs after discharge will improve Outcome: Completed/Met Date Met:  02/23/15 Pt assessed for discharge needs. Discharge medications ordered.

## 2015-02-23 NOTE — Care Management Note (Signed)
Case Management Note  Patient Details  Name: [name] MRN: [id number] Date of Birth: Aug 05, 1945  Subjective/Objective: Pt admitted from home for bradycardia/ rapid A fib. Plan for d/c home today.                    Action/Plan: No needs identified by CM at this time.    Expected Discharge Date:                  Expected Discharge Plan:  Home/Self Care  In-House Referral:  NA  Discharge planning Services  CM Consult  Post Acute Care Choice:  NA Choice offered to:  NA  DME Arranged:  N/A DME Agency:  NA  HH Arranged:  NA HH Agency:  NA  Status of Service:  Completed, signed off  Medicare Important Message Given:    Date Medicare IM Given:    Medicare IM give by:    Date Additional Medicare IM Given:    Additional Medicare Important Message give by:     If discussed at [place] of Stay Meetings, dates discussed:    Additional Comments:  Bethena Roys, RN 02/23/2015, 11:13 AM

## 2015-02-23 NOTE — Plan of Care (Signed)
Problem: Education: Goal: Understanding of medication regimen will improve Outcome: Completed/Met Date Met:  02/23/15 Pt educated on HR medication and anticoagulation medication. Pt verbalized understanding and importance of compliance with medications.

## 2015-03-05 ENCOUNTER — Telehealth: Payer: Self-pay

## 2015-03-05 ENCOUNTER — Ambulatory Visit (INDEPENDENT_AMBULATORY_CARE_PROVIDER_SITE_OTHER): Payer: Medicare Other | Admitting: Physician Assistant

## 2015-03-05 ENCOUNTER — Encounter: Payer: Self-pay | Admitting: Physician Assistant

## 2015-03-05 VITALS — BP 112/62 | HR 111 | Ht 67.0 in | Wt 159.0 lb

## 2015-03-05 DIAGNOSIS — I10 Essential (primary) hypertension: Secondary | ICD-10-CM

## 2015-03-05 DIAGNOSIS — I4891 Unspecified atrial fibrillation: Secondary | ICD-10-CM | POA: Diagnosis not present

## 2015-03-05 DIAGNOSIS — I482 Chronic atrial fibrillation, unspecified: Secondary | ICD-10-CM

## 2015-03-05 MED ORDER — METOPROLOL TARTRATE 50 MG PO TABS: 75.0000 mg | ORAL_TABLET | Freq: Two times a day (BID) | ORAL | Status: DC

## 2015-03-05 NOTE — Patient Instructions (Addendum)
Medication Instructions:  INCREASE Metoprolol Tartrate to 75 mg - take 1.5 tablets (75 mg total) by mouth twice daily.  >>A new prescription has been sent to your pharmacy electronically.  Labwork: NONE  Testing/Procedures: Your physician has requested that you have a lexiscan myoview. For further information please visit [email or web address]. Please follow instruction sheet, as given.  Follow-Up: Please keep your previously scheduled appointment with Dr Mare Ferrari.  If you need a refill on your cardiac medications before your next appointment, please call your pharmacy.   **Please call if your blood pressures are consistently low (100/50) with the increased dose of metoprolol.

## 2015-03-05 NOTE — Telephone Encounter (Signed)
After patient had left and while reviewing the chart, B Hager, PA-C, ordered a lexiscan myoview. I placed the order, and attempted to contact the patient. Left message for patient to call back.

## 2015-03-05 NOTE — Progress Notes (Signed)
Patient ID: [name], female   DOB: 1945/08/23, 69 y.o.   MRN: [id number]    Date:  03/05/2015   ID:  [name], DOB May 12, 1945, MRN [id number]  PCP:  Tula Nakayama  Primary Cardiologist:  Mare Ferrari   Chief complaint:  Post hospital follow-up   History of Present Illness: [name] is a 69 y.o. female with a history of atrial fibrillation on Xarelto, normal EF by echo 2016, hypertension, hyperlipidemia, diabetes mellitus, GERD, depression, anxiety, rheumatoid arthritis, hepatitis, and DVT (on Pradaxa), who was admitted 11/19 with chest pain, shortness of breath and bradycardia.  Admit 07/06 for rapid atrial fib, d/c on Cardizem CD 360, Toprol XL 150 mg bid and dig 0.125 mg.   10/22/2014 Toprol XL increased to 200 mg bid  On 11/18 she had an outpatient EGD and colonoscopy for anemia. The EGD was normal and the colon showed non-bleeding cecal AVM, diverticula, a small polyp and a lipoma that were biopsied. She was cleared to restart Xarelto.   On 11/19, she got up to go to the kitchen and felt a great heaviness in her lower left chest. It was hard to breathe. The same thing happened again when she tried to get up. She sat in the chair for a while, then was able to get to the bedroom. She noted her HR to be low and called EMS. Per verbal reports, her HR was in the 20s at times. In the hospital, she was initially in the 30s, but trended up to rapid atrial fib.  The pressure in her chest reached a 9/10. The pain was worse when she tried to get up and was worse with deep inspiration. She tried no meds at home and got none by EMS. The pain resolved on its own after about 15 minutes.  She was discontinued and Cardizem resumed to a 40 mg daily. She was originally on Lopressor 200 mg twice a day have been decreased to 50 mg twice daily.    Patient presents for posthospital follow-up she has no particular complaints. She checked her heart rate earlier was 111 as it is here. She denies  nausea, vomiting, fever, chest pain, shortness of breath, orthopnea, dizziness, PND, cough, congestion, abdominal pain, hematochezia, melena, lower extremity edema, claudication.   Wt Readings from Last 3 Encounters:  03/05/15 159 lb (72.122 kg)  02/23/15 156 lb 11.2 oz (71.079 kg)  02/20/15 159 lb (72.122 kg)     Past Medical History  Diagnosis Date  . Chest pain   . Atrial fibrillation ([place])   . Anemia     hx of  . Hypertension   . Rheumatoid arthritis(714.0)   . Gastroesophageal reflux disease   . Hyperlipidemia   . Hepatitis 1970  . Jaundice     age 45  . Actinic keratosis   . Atrial fibrillation ([place])   . Carpal tunnel syndrome   . Edema   . Hiatal hernia   . Memory loss   . Thyroid disease     multi nodular goiter  . Type II diabetes mellitus ([place])   . Anxiety   . Depression     Current Outpatient Prescriptions  Medication Sig Dispense Refill  . buPROPion (WELLBUTRIN SR) 150 MG 12 hr tablet Take 150 mg by mouth 2 (two) times daily.      . Cholecalciferol (VITAMIN D3) 2000 UNITS TABS Take 2,000 Units by mouth daily.     . Cyanocobalamin (VITAMIN B 12 PO) Take 2,000 mcg by  mouth daily.     [name] diltiazem (CARDIZEM CD) 240 MG 24 hr capsule Take 1 capsule (240 mg total) by mouth daily. 30 capsule 0  . esomeprazole (NEXIUM) 40 MG capsule Take 40 mg by mouth daily.      . furosemide (LASIX) 40 MG tablet TAKE 1 TABLET BY MOUTH EVERY DAY 90 tablet 2  . glimepiride (AMARYL) 2 MG tablet Take 2 mg by mouth every morning.  3  . HYDROcodone-acetaminophen (NORCO/VICODIN) 5-325 MG tablet Take 1 tablet by mouth every 4 (four) hours as needed.  0  . KLOR-CON 10 10 MEQ tablet TAKE 2 TABLETS BY MOUTH TWICE A DAY 120 tablet 10  . leflunomide (ARAVA) 20 MG tablet Take 20 mg by mouth daily.     . metFORMIN (GLUCOPHAGE-XR) 500 MG 24 hr tablet Take 1,000 mg by mouth 2 (two) times daily.  1  . metoprolol (LOPRESSOR) 50 MG tablet Take 1.5 tablets (75 mg total) by mouth 2 (two) times daily. 90  tablet 11  . Multiple Vitamins-Minerals (PRESERVISION AREDS) CAPS Take 1 capsule by mouth 2 (two) times daily.    . nitroGLYCERIN (NITROSTAT) 0.4 MG SL tablet Place 0.4 mg under the tongue every 5 (five) minutes as needed for chest pain (chest pain).    . Omega 3 1200 MG CAPS Take 1 capsule by mouth daily.     [name] ORENCIA 125 MG/ML SOSY Inject 125 mg into the skin every 7 (seven) days. Either Wed or Thurs.    . predniSONE (DELTASONE) 5 MG tablet Take 1 tablet (5 mg total) by mouth daily. Take 2 tablet (10mg ) daily for 2 days before resume 5mg  daily thereafter (Patient taking differently: Take 5 mg by mouth daily. ) 30 tablet 0  . rivaroxaban (XARELTO) 20 MG TABS tablet Take 1 tablet (20 mg total) by mouth daily with supper. 30 tablet 3   No current facility-administered medications for this visit.    Allergies:   No Known Allergies  Social History:  The patient  reports that she has never smoked. She has never used smokeless tobacco. She reports that she does not drink alcohol or use illicit drugs.   Family history:   Family History  Problem Relation Age of Onset  . Heart failure Sister   . Heart attack Neg Hx   . Stroke Father   . Hypertension Sister     ROS:  Please see the history of present illness.  All other systems reviewed and negative.   PHYSICAL EXAM: VS:  BP 112/62 mmHg  Pulse 111  Ht 5\' 7"  (1.702 m)  Wt 159 lb (72.122 kg)  BMI 24.90 kg/m2 Well nourished, well developed, in no acute distress HEENT: Pupils are equal round react to light accommodation extraocular movements are intact.  Neck: no JVDNo cervical lymphadenopathy. Cardiac: Irregular rate and rhythm rate elevated  Lungs:  clear to auscultation bilaterally, no wheezing, rhonchi or rales Ext: Trace lower extremity edema.  2+ radial and dorsalis pedis pulses. Skin: warm and dry Neuro:  Grossly normal  EKG: Atrial fibrillation with a rate of 111 bpm  ASSESSMENT AND PLAN:  Problem List Items Addressed This  Visit    Hypertension   Relevant Medications   metoprolol (LOPRESSOR) 50 MG tablet   Atrial fibrillation with slow ventricular response (HCC)   Relevant Medications   metoprolol (LOPRESSOR) 50 MG tablet   A-fib (HCC) - Primary   Relevant Medications   metoprolol (LOPRESSOR) 50 MG tablet   Other Relevant Orders  EKG 12-Lead     Patient continues in atrial fibrillation with a rapid ventricular response. Her rate is 111 bpm.  She is asymptomatic. We'll try and increase her metoprolol to 75 twice a day. She'll monitor her blood pressure, which is normal today.  If it drops below 100/50, she'll let us know.  If she develops bradycardia again, she'll need be evaluated by EP for pacemaker.  Check Lexiscan Myoview to rule out ischemic causes for bradycardia.

## 2015-03-06 NOTE — Telephone Encounter (Signed)
Patient returned call. Patient notified that scheduling would be calling her to set up stress test. Patient understood and agreed with plan.

## 2015-03-11 ENCOUNTER — Telehealth (HOSPITAL_COMMUNITY): Payer: Self-pay

## 2015-03-11 NOTE — Telephone Encounter (Signed)
Encounter complete. 

## 2015-03-13 ENCOUNTER — Ambulatory Visit (HOSPITAL_COMMUNITY)
Admission: RE | Admit: 2015-03-13 | Discharge: 2015-03-13 | Disposition: A | Discharge status: Home / Self Care | Payer: Medicare Other | Source: Ambulatory Visit | Attending: Cardiology | Admitting: Cardiology

## 2015-03-13 DIAGNOSIS — E119 Type 2 diabetes mellitus without complications: Secondary | ICD-10-CM | POA: Diagnosis not present

## 2015-03-13 DIAGNOSIS — Z8249 Family history of ischemic heart disease and other diseases of the circulatory system: Secondary | ICD-10-CM | POA: Diagnosis not present

## 2015-03-13 DIAGNOSIS — I10 Essential (primary) hypertension: Secondary | ICD-10-CM | POA: Diagnosis not present

## 2015-03-13 DIAGNOSIS — Z87891 Personal history of nicotine dependence: Secondary | ICD-10-CM | POA: Insufficient documentation

## 2015-03-13 DIAGNOSIS — R42 Dizziness and giddiness: Secondary | ICD-10-CM | POA: Diagnosis not present

## 2015-03-13 DIAGNOSIS — I4891 Unspecified atrial fibrillation: Secondary | ICD-10-CM | POA: Diagnosis present

## 2015-03-13 LAB — MYOCARDIAL PERFUSION IMAGING
CHL CUP RESTING HR STRESS: 111 {beats}/min
CSEPPHR: 130 {beats}/min
NUC STRESS TID: 1.18
SDS: 0
SRS: 0
SSS: 0

## 2015-03-13 MED ORDER — TECHNETIUM TC 99M SESTAMIBI GENERIC - CARDIOLITE
31.0000 | Freq: Once | INTRAVENOUS | Status: AC | PRN
  Administered 2015-03-13: 31 via INTRAVENOUS

## 2015-03-13 MED ORDER — TECHNETIUM TC 99M SESTAMIBI GENERIC - CARDIOLITE
10.9000 | Freq: Once | INTRAVENOUS | Status: AC | PRN
  Administered 2015-03-13: 10.9 via INTRAVENOUS

## 2015-03-13 MED ORDER — AMINOPHYLLINE 25 MG/ML IV SOLN
75.0000 mg | Freq: Once | INTRAVENOUS | Status: AC
  Administered 2015-03-13: 75 mg via INTRAVENOUS

## 2015-03-13 MED ORDER — REGADENOSON 0.4 MG/5ML IV SOLN
0.4000 mg | Freq: Once | INTRAVENOUS | Status: AC
  Administered 2015-03-13: 0.4 mg via INTRAVENOUS

## 2015-03-16 ENCOUNTER — Telehealth: Payer: Self-pay | Admitting: *Deleted

## 2015-03-16 NOTE — Telephone Encounter (Signed)
Called pt and made her aware that her stress test was normal.  Pt didn't have any questions at this time and verbalized understanding.

## 2015-03-16 NOTE — Telephone Encounter (Signed)
-----   Message from Stanton Kidney, RN sent at 03/16/2015  9:26 AM EST -----   ----- Message -----    From: Brett Canales, PA-C    Sent: 03/13/2015   5:26 PM      To: [address] Triage  Please let the patient know her stress test was normal.  Thanks, Tarri Fuller, PAC

## 2015-03-17 ENCOUNTER — Other Ambulatory Visit: Payer: Self-pay | Admitting: Cardiology

## 2015-04-09 ENCOUNTER — Ambulatory Visit (INDEPENDENT_AMBULATORY_CARE_PROVIDER_SITE_OTHER): Payer: Medicare Other | Admitting: Cardiology

## 2015-04-09 ENCOUNTER — Encounter: Payer: Self-pay | Admitting: Cardiology

## 2015-04-09 VITALS — BP 118/82 | HR 88 | Ht 67.0 in | Wt 160.8 lb

## 2015-04-09 DIAGNOSIS — I10 Essential (primary) hypertension: Secondary | ICD-10-CM

## 2015-04-09 DIAGNOSIS — I4891 Unspecified atrial fibrillation: Secondary | ICD-10-CM | POA: Diagnosis not present

## 2015-04-09 NOTE — Progress Notes (Signed)
Cardiology Office Note   Date:  04/09/2015   ID:  [name], DOB 11/23/1945, MRN [id number]  PCP:  [name]  Cardiologist: [name]  Chief Complaint  Patient presents with  . Follow-up    chronic A-Fib      History of Present Illness: [name] is a 70 y.o. female who presents for a follow-up visit  This 70 year old woman has HTN, DM, obesity, HLD and atrial fib. She is on chronic anticoagulation. She is on Xarelto 20 mg daily. Not short of breath. Her swelling has resolved. She underwent successful right hip surgery April 03 2013 Since last visit she has not been experiencing any new cardiac symptoms. She has significant rheumatoid arthritis and is on 3 medications for arthritis. [name] is her rheumatologist. She is diabetic. She is not having any hypoglycemic episodes. He has occasional edema in her ankles at the end of the day which goes down overnight. The patient was hospitalized on 02/21/15 with chest pressure and bradycardia.  In the hospital her medicines were reduced.  Digoxin was stopped and beta blocker was reduced.  Since discharge she has felt much better.  She denies any chest discomfort or shortness of breath.  She is not having any dizziness or syncope.  Post hospital she had an outpatient Myoview on 03/13/15 which showed no ischemia.  The study was not gated because of her atrial fibrillation.  Past Medical History  Diagnosis Date  . Chest pain   . Atrial fibrillation ([name])   . Anemia     hx of  . Hypertension   . Rheumatoid arthritis(714.0)   . Gastroesophageal reflux disease   . Hyperlipidemia   . Hepatitis 1970  . Jaundice     age 36  . Actinic keratosis   . Atrial fibrillation ([place])   . Carpal tunnel syndrome   . Edema   . Hiatal hernia   . Memory loss   . Thyroid disease     multi nodular goiter  . Type II diabetes mellitus ([name])   . Anxiety   . Depression     Past Surgical History  Procedure  Laterality Date  . Hand surgery  1995 and 1996    artificial joints both hands  . Cataract extraction Left   . Total hip arthroplasty Left 2009  . Total hip arthroplasty Right 04/03/2013    Procedure: RIGHT TOTAL HIP ARTHROPLASTY ANTERIOR APPROACH;  Surgeon: Christina Alf, [name];  Location: [facility];  Service: Orthopedics;  Laterality: Right;  . Joint replacement    . Esophagogastroduodenoscopy (egd) with propofol N/A 02/20/2015    Procedure: ESOPHAGOGASTRODUODENOSCOPY (EGD) WITH PROPOFOL;  Surgeon: Carol Ada, [name];  Location: [facility];  Service: Endoscopy;  Laterality: N/A;  . Colonoscopy with propofol N/A 02/20/2015    Procedure: COLONOSCOPY WITH PROPOFOL;  Surgeon: Carol Ada, [name];  Location: [facility];  Service: Endoscopy;  Laterality: N/A;     Current Outpatient Prescriptions  Medication Sig Dispense Refill  . buPROPion (WELLBUTRIN SR) 150 MG 12 hr tablet Take 150 mg by mouth 2 (two) times daily.      . Cholecalciferol (VITAMIN D3) 2000 UNITS TABS Take 2,000 Units by mouth daily.     . Cyanocobalamin (VITAMIN B 12 PO) Take 2,000 mcg by mouth daily.     [name] diltiazem (CARDIZEM CD) 240 MG 24 hr capsule Take 240 mg by mouth daily.    [name] esomeprazole (NEXIUM) 40 MG capsule Take 40 mg by mouth daily.      [name]  furosemide (LASIX) 40 MG tablet TAKE 1 TABLET BY MOUTH EVERY DAY 90 tablet 2  . glimepiride (AMARYL) 2 MG tablet Take 2 mg by mouth every morning.  3  . HYDROcodone-acetaminophen (NORCO/VICODIN) 5-325 MG tablet Take 1 tablet by mouth every 4 (four) hours as needed for moderate pain or severe pain.   0  . KLOR-CON 10 10 MEQ tablet TAKE 2 TABLETS BY MOUTH TWICE A DAY 120 tablet 10  . leflunomide (ARAVA) 20 MG tablet Take 20 mg by mouth daily.     . metFORMIN (GLUCOPHAGE-XR) 500 MG 24 hr tablet Take 1,000 mg by mouth 2 (two) times daily.  1  . metoprolol (LOPRESSOR) 50 MG tablet Take 1.5 tablets (75 mg total) by mouth 2 (two) times daily. 90 tablet 11  . Multiple Vitamins-Minerals  (PRESERVISION AREDS) CAPS Take 1 capsule by mouth 2 (two) times daily.    . nitroGLYCERIN (NITROSTAT) 0.4 MG SL tablet Place 0.4 mg under the tongue every 5 (five) minutes as needed for chest pain (x 3 doses).     . Omega 3 1200 MG CAPS Take 1 capsule by mouth daily.     [name] ORENCIA 125 MG/ML SOSY Inject 125 mg into the skin every 7 (seven) days. Either Wed or Thurs.    . predniSONE (DELTASONE) 5 MG tablet Take 5 mg by mouth daily.    . rivaroxaban (XARELTO) 20 MG TABS tablet Take 1 tablet (20 mg total) by mouth daily with supper. 30 tablet 3   No current facility-administered medications for this visit.    Allergies:   Review of patient's allergies indicates no known allergies.    Social History:  The patient  reports that she has never smoked. She has never used smokeless tobacco. She reports that she does not drink alcohol or use illicit drugs.   Family History:  The patient's family history includes Heart failure in her sister; Hypertension in her sister; Stroke in her father. There is no history of Heart attack.    ROS:  Please see the history of present illness.   Otherwise, review of systems are positive for none.   All other systems are reviewed and negative.    PHYSICAL EXAM: VS:  BP 118/82 mmHg  Pulse 88  Ht 5\' 7"  (1.702 m)  Wt 160 lb 12.8 oz (72.938 kg)  BMI 25.18 kg/m2 , BMI Body mass index is 25.18 kg/(m^2). GEN: Well nourished, well developed, in no acute distress HEENT: normal Neck: no JVD, carotid bruits, or masses Cardiac: Irregularly irregular; no murmurs, rubs, or gallops,no edema  Respiratory:  clear to auscultation bilaterally, normal work of breathing GI: soft, nontender, nondistended, + BS MS: no deformity or atrophy Skin: warm and dry, no rash Neuro:  Strength and sensation are intact Psych: euthymic mood, full affect   EKG:  EKG is not ordered today. The ekg ordered today demonstrates    Recent Labs: 02/21/2015: ALT 18; B Natriuretic Peptide  193.0* 02/22/2015: BUN 16; Creatinine, Ser 0.73; Hemoglobin 8.7*; Platelets 424*; Potassium 4.0; Sodium 141; TSH 1.675    Lipid Panel    Component Value Date/Time   CHOL 156 02/22/2015 0155   TRIG 130 02/22/2015 0155   HDL 18* 02/22/2015 0155   CHOLHDL 8.7 02/22/2015 0155   VLDL 26 02/22/2015 0155   LDLCALC 112* 02/22/2015 0155   LDLDIRECT 117.3 09/06/2012 0954      Wt Readings from Last 3 Encounters:  04/09/15 160 lb 12.8 oz (72.938 kg)  03/13/15 159 lb (72.122  kg)  03/05/15 159 lb (72.122 kg)       ASSESSMENT AND PLAN:  1. permanent atrial fibrillation.  Satisfactory rate control on current therapy 2. Hypertensive heart disease without heart failure.  Blood pressure stable on current therapy 3. diabetes mellitus type 2 4. rheumatoid arthritis 5. Osteoarthritis   Current medicines are reviewed at length with the patient today.  The patient does not have concerns regarding medicines.  The following changes have been made:  no change  Labs/ tests ordered today include:  No orders of the defined types were placed in this encounter.    Disposition: Continue current therapy.  Recheck in 4 months for office visit and EKG with Dr. Stanford Breed at [facility] office  Signed, [name] 04/09/2015 5:28 PM    [address], [place],   [address] Phone: [phone]; Fax: [phone]

## 2015-04-09 NOTE — Patient Instructions (Signed)
Medication Instructions:  Your physician recommends that you continue on your current medications as directed. Please refer to the Current Medication list given to you today.  Labwork: NONE  Testing/Procedures: NONE  Follow-Up: Your physician wants you to follow-up in: [place] will receive a reminder letter in the mail two months in advance. If you don't receive a letter, please call our office to schedule the follow-up appointment.  If you need a refill on your cardiac medications before your next appointment, please call your pharmacy.

## 2015-04-29 DIAGNOSIS — R05 Cough: Secondary | ICD-10-CM | POA: Diagnosis not present

## 2015-05-11 DIAGNOSIS — L570 Actinic keratosis: Secondary | ICD-10-CM | POA: Diagnosis not present

## 2015-05-11 DIAGNOSIS — D239 Other benign neoplasm of skin, unspecified: Secondary | ICD-10-CM | POA: Diagnosis not present

## 2015-05-11 DIAGNOSIS — L820 Inflamed seborrheic keratosis: Secondary | ICD-10-CM | POA: Diagnosis not present

## 2015-05-25 DIAGNOSIS — M4806 Spinal stenosis, lumbar region: Secondary | ICD-10-CM | POA: Diagnosis not present

## 2015-05-25 DIAGNOSIS — M545 Low back pain: Secondary | ICD-10-CM | POA: Diagnosis not present

## 2015-05-25 DIAGNOSIS — M5136 Other intervertebral disc degeneration, lumbar region: Secondary | ICD-10-CM | POA: Diagnosis not present

## 2015-06-02 DIAGNOSIS — M545 Low back pain: Secondary | ICD-10-CM | POA: Diagnosis not present

## 2015-06-09 DIAGNOSIS — M5136 Other intervertebral disc degeneration, lumbar region: Secondary | ICD-10-CM | POA: Diagnosis not present

## 2015-06-09 DIAGNOSIS — M4806 Spinal stenosis, lumbar region: Secondary | ICD-10-CM | POA: Diagnosis not present

## 2015-06-14 DIAGNOSIS — J189 Pneumonia, unspecified organism: Secondary | ICD-10-CM | POA: Diagnosis not present

## 2015-06-15 DIAGNOSIS — Z79899 Other long term (current) drug therapy: Secondary | ICD-10-CM | POA: Diagnosis not present

## 2015-06-15 DIAGNOSIS — Z7952 Long term (current) use of systemic steroids: Secondary | ICD-10-CM | POA: Diagnosis not present

## 2015-06-15 DIAGNOSIS — M2550 Pain in unspecified joint: Secondary | ICD-10-CM | POA: Diagnosis not present

## 2015-06-15 DIAGNOSIS — M5030 Other cervical disc degeneration, unspecified cervical region: Secondary | ICD-10-CM | POA: Diagnosis not present

## 2015-06-15 DIAGNOSIS — M0589 Other rheumatoid arthritis with rheumatoid factor of multiple sites: Secondary | ICD-10-CM | POA: Diagnosis not present

## 2015-06-15 DIAGNOSIS — Z13820 Encounter for screening for osteoporosis: Secondary | ICD-10-CM | POA: Diagnosis not present

## 2015-06-15 DIAGNOSIS — R062 Wheezing: Secondary | ICD-10-CM | POA: Diagnosis not present

## 2015-06-15 DIAGNOSIS — M150 Primary generalized (osteo)arthritis: Secondary | ICD-10-CM | POA: Diagnosis not present

## 2015-06-19 DIAGNOSIS — J189 Pneumonia, unspecified organism: Secondary | ICD-10-CM | POA: Diagnosis not present

## 2015-06-24 DIAGNOSIS — M5136 Other intervertebral disc degeneration, lumbar region: Secondary | ICD-10-CM | POA: Diagnosis not present

## 2015-06-29 DIAGNOSIS — M0589 Other rheumatoid arthritis with rheumatoid factor of multiple sites: Secondary | ICD-10-CM | POA: Diagnosis not present

## 2015-07-17 ENCOUNTER — Ambulatory Visit: Payer: Medicare Other

## 2015-07-17 ENCOUNTER — Encounter: Payer: Self-pay | Admitting: Hematology & Oncology

## 2015-07-17 ENCOUNTER — Ambulatory Visit (HOSPITAL_BASED_OUTPATIENT_CLINIC_OR_DEPARTMENT_OTHER): Payer: Medicare Other | Admitting: Hematology & Oncology

## 2015-07-17 ENCOUNTER — Other Ambulatory Visit (HOSPITAL_BASED_OUTPATIENT_CLINIC_OR_DEPARTMENT_OTHER): Payer: Medicare Other

## 2015-07-17 VITALS — BP 112/79 | HR 129 | Temp 98.0°F | Resp 16 | Ht 67.0 in | Wt 172.0 lb

## 2015-07-17 DIAGNOSIS — D500 Iron deficiency anemia secondary to blood loss (chronic): Secondary | ICD-10-CM | POA: Diagnosis not present

## 2015-07-17 DIAGNOSIS — D631 Anemia in chronic kidney disease: Secondary | ICD-10-CM | POA: Diagnosis not present

## 2015-07-17 DIAGNOSIS — N183 Chronic kidney disease, stage 3 unspecified: Secondary | ICD-10-CM

## 2015-07-17 DIAGNOSIS — T45515S Adverse effect of anticoagulants, sequela: Secondary | ICD-10-CM

## 2015-07-17 DIAGNOSIS — T45515A Adverse effect of anticoagulants, initial encounter: Secondary | ICD-10-CM

## 2015-07-17 HISTORY — DX: Chronic kidney disease, stage 3 unspecified: N18.30

## 2015-07-17 HISTORY — DX: Adverse effect of anticoagulants, initial encounter: T45.515A

## 2015-07-17 HISTORY — DX: Iron deficiency anemia secondary to blood loss (chronic): D50.0

## 2015-07-17 HISTORY — DX: Anemia in chronic kidney disease: D63.1

## 2015-07-17 LAB — CBC WITH DIFFERENTIAL (CANCER CENTER ONLY)
BASO#: 0 10*3/uL (ref 0.0–0.2)
BASO%: 0.3 % (ref 0.0–2.0)
EOS%: 2.6 % (ref 0.0–7.0)
Eosinophils Absolute: 0.3 10*3/uL (ref 0.0–0.5)
HCT: 28.9 % — ABNORMAL LOW (ref 34.8–46.6)
HEMOGLOBIN: 7.8 g/dL — AB (ref 11.6–15.9)
LYMPH#: 1.2 10*3/uL (ref 0.9–3.3)
LYMPH%: 11.7 % — AB (ref 14.0–48.0)
MCH: 16.6 pg — ABNORMAL LOW (ref 26.0–34.0)
MCHC: 27 g/dL — AB (ref 32.0–36.0)
MCV: 61 fL — ABNORMAL LOW (ref 81–101)
MONO#: 0.8 10*3/uL (ref 0.1–0.9)
MONO%: 8.4 % (ref 0.0–13.0)
NEUT%: 77 % (ref 39.6–80.0)
NEUTROS ABS: 7.7 10*3/uL — AB (ref 1.5–6.5)
PLATELETS: 395 10*3/uL (ref 145–400)
RBC: 4.71 10*6/uL (ref 3.70–5.32)
RDW: 22.3 % — AB (ref 11.1–15.7)
WBC: 10 10*3/uL (ref 3.9–10.0)

## 2015-07-17 LAB — CMP (CANCER CENTER ONLY)
ALBUMIN: 3.3 g/dL (ref 3.3–5.5)
ALT(SGPT): 11 U/L (ref 10–47)
AST: 18 U/L (ref 11–38)
Alkaline Phosphatase: 121 U/L — ABNORMAL HIGH (ref 26–84)
BILIRUBIN TOTAL: 0.9 mg/dL (ref 0.20–1.60)
BUN, Bld: 16 mg/dL (ref 7–22)
CO2: 26 meq/L (ref 18–33)
Calcium: 9.6 mg/dL (ref 8.0–10.3)
Chloride: 100 mEq/L (ref 98–108)
Creat: 0.6 mg/dl (ref 0.6–1.2)
GLUCOSE: 102 mg/dL (ref 73–118)
Potassium: 3.7 mEq/L (ref 3.3–4.7)
SODIUM: 138 meq/L (ref 128–145)
Total Protein: 7.2 g/dL (ref 6.4–8.1)

## 2015-07-17 LAB — IRON AND TIBC: TIBC: 473 ug/dL — ABNORMAL HIGH (ref 236–444)

## 2015-07-17 LAB — TECHNOLOGIST REVIEW CHCC SATELLITE

## 2015-07-17 LAB — RETICULOCYTES: Reticulocyte Count: 2.6 % (ref 0.6–2.6)

## 2015-07-17 LAB — FERRITIN: FERRITIN: 33 ng/mL (ref 9–269)

## 2015-07-17 LAB — CHCC SATELLITE - SMEAR

## 2015-07-17 NOTE — Progress Notes (Signed)
Referral MD  Reason for Referral: Severe microcytic anemia-iron deficient secondary to blood loss due to chronic anticoagulation. Erythropoietin deficiency secondary to chronic renal insufficiency   Chief Complaint  Patient presents with  . Other    New Patient  : My blood is low.  HPI: [name] is a very nice 70 year old white female. She has multiple medical problems. She is on  chronic anticoagulation. She has chronic atrial fibrillation. She is on Xarelto. She probably had a thrombus about a year ago in the left leg.  She also has diabetes.  She has a route for arthritis. She's been on multiple medications for this.  She has been noted to be anemic. She's had anemia for a while. She says that she is seen for doctors and has been told that she is anemic but no one has helped.  Going back to July 2026 in, her hemoglobin was 9.8. Her MCV was 73.  In November of last year, her white cell count was 11.9. Hemoglobin 8.7. Platelet count 40 24,000. MCV was 69.  For some reason, she never has had iron studies done.  She did have an erythropoietin level checked a year ago. It was only 26.  I do not see where she has had a reticulocyte count done.  She does feel tired. She does not have a lot of stamina.  She had a colonoscopy and upper endoscopy. Nothing was obvious with respect to bleeding. She did have an AVM but this was not bleeding.  She was kindly referred to the [facility] for an evaluation.  Overall, her performance status is ECOG 2.    Past Medical History  Diagnosis Date  . Chest pain   . Atrial fibrillation ([place])   . Anemia     hx of  . Hypertension   . Rheumatoid arthritis(714.0)   . Gastroesophageal reflux disease   . Hyperlipidemia   . Hepatitis 1970  . Jaundice     age 61  . Actinic keratosis   . Atrial fibrillation ([place])   . Carpal tunnel syndrome   . Edema   . Hiatal hernia   . Memory loss   . Thyroid disease     multi nodular  goiter  . Type II diabetes mellitus ([place])   . Anxiety   . Depression   . Iron deficiency anemia due to chronic blood loss 07/17/2015  . Anemia of chronic renal failure, stage 3 (moderate) 07/17/2015  . Anticoagulant causing adverse effect in therapeutic use 07/17/2015  :  Past Surgical History  Procedure Laterality Date  . Hand surgery  1995 and 1996    artificial joints both hands  . Cataract extraction Left   . Total hip arthroplasty Left 2009  . Total hip arthroplasty Right 04/03/2013    Procedure: RIGHT TOTAL HIP ARTHROPLASTY ANTERIOR APPROACH;  Surgeon: Gearlean Alf, MD;  Location: [facility];  Service: Orthopedics;  Laterality: Right;  . Joint replacement    . Esophagogastroduodenoscopy (egd) with propofol N/A 02/20/2015    Procedure: ESOPHAGOGASTRODUODENOSCOPY (EGD) WITH PROPOFOL;  Surgeon: Carol Ada, MD;  [facility];  Service: Endoscopy;  Laterality: N/A;  . Colonoscopy with propofol N/A 02/20/2015    Procedure: COLONOSCOPY WITH PROPOFOL;  Surgeon: Carol Ada, MD;  [facility];  Service: Endoscopy;  Laterality: N/A;  :   Current outpatient prescriptions:  .  buPROPion (WELLBUTRIN SR) 150 MG 12 hr tablet, Take 150 mg by mouth 2 (two) times daily.  , Disp: ,  Rfl:  .  Cholecalciferol (VITAMIN D3) 2000 UNITS TABS, Take 2,000 Units by mouth daily. , Disp: , Rfl:  .  Cyanocobalamin (VITAMIN B 12 PO), Take 2,000 mcg by mouth daily. , Disp: , Rfl:  .  diltiazem (CARDIZEM CD) 240 MG 24 hr capsule, Take 240 mg by mouth daily., Disp: , Rfl:  .  esomeprazole (NEXIUM) 40 MG capsule, Take 40 mg by mouth daily.  , Disp: , Rfl:  .  fluticasone (FLONASE) 50 MCG/ACT nasal spray, , Disp: , Rfl:  .  furosemide (LASIX) 40 MG tablet, TAKE 1 TABLET BY MOUTH EVERY DAY, Disp: 90 tablet, Rfl: 2 .  glimepiride (AMARYL) 2 MG tablet, Take 2 mg by mouth every morning., Disp: , Rfl: 3 .  HYDROcodone-acetaminophen (NORCO/VICODIN) 5-325 MG tablet, Take 1 tablet by mouth every 4  (four) hours as needed for moderate pain or severe pain. , Disp: , Rfl: 0 .  KLOR-CON 10 10 MEQ tablet, TAKE 2 TABLETS BY MOUTH TWICE A DAY, Disp: 120 tablet, Rfl: 10 .  leflunomide (ARAVA) 20 MG tablet, Take 20 mg by mouth daily. , Disp: , Rfl:  .  metFORMIN (GLUCOPHAGE-XR) 500 MG 24 hr tablet, Take 1,000 mg by mouth 2 (two) times daily., Disp: , Rfl: 1 .  metoprolol (LOPRESSOR) 50 MG tablet, Take 1.5 tablets (75 mg total) by mouth 2 (two) times daily., Disp: 90 tablet, Rfl: 11 .  Multiple Vitamins-Minerals (PRESERVISION AREDS) CAPS, Take 1 capsule by mouth 2 (two) times daily., Disp: , Rfl:  .  nitroGLYCERIN (NITROSTAT) 0.4 MG SL tablet, Place 0.4 mg under the tongue every 5 (five) minutes as needed for chest pain (x 3 doses). , Disp: , Rfl:  .  Omega 3 1200 MG CAPS, Take 1 capsule by mouth daily. , Disp: , Rfl:  .  ORENCIA 125 MG/ML SOSY, Inject 125 mg into the skin every 7 (seven) days. Either Wed or Thurs., Disp: , Rfl:  .  predniSONE (DELTASONE) 5 MG tablet, Take 5 mg by mouth daily., Disp: , Rfl:  .  rivaroxaban (XARELTO) 20 MG TABS tablet, Take 1 tablet (20 mg total) by mouth daily with supper., Disp: 30 tablet, Rfl: 3:  :  No Known Allergies:  Family History  Problem Relation Age of Onset  . Heart failure Sister   . Heart attack Neg Hx   . Stroke Father   . Hypertension Sister   :  Social History   Social History  . Marital Status: Divorced    Spouse Name: N/A  . Number of Children: N/A  . Years of Education: N/A   Occupational History  . Retired    Social History Main Topics  . Smoking status: Never Smoker   . Smokeless tobacco: Never Used  . Alcohol Use: No  . Drug Use: No  . Sexual Activity: Not Currently   Other Topics Concern  . Not on file   Social History Narrative  :  Pertinent items are noted in HPI.  Exam: _0 @ Elderly white female in no obvious distress. Vital signs are temperature of 98. Pulse 129. Blood pressure 111/79. Weight is 172lbs.  Hent exam shows no ocular or oral lesions. Her conjunctiva are pale. There is no scleral icterus. She has no oral lesions. There is no adenopathy in her neck. Thyroid is nonpalpable. Lungs are clear bilaterally. Cardiac exam regular rate and rhythm consistent with atrial fibrillation. Her rate is somewhat fast. Her abdomen is soft. There is no fluid wave. There is  no palpable liver or spleen tip. Back exam shows no tenderness over the spine, ribs or hips. Extremity shows significant arthritic changes from her rheumatoid arthritis. She has some slight swelling of the left knee. Skin exam shows no rashes. Neurological exam shows no focal neurological deficits.     Recent Labs  07/17/15 1325  WBC 10.0  HGB 7.8*  HCT 28.9*  PLT 395    Recent Labs  07/17/15 1325  NA 138  K 3.7  CL 100  CO2 26  GLUCOSE 102  BUN 16  CREATININE 0.6  CALCIUM 9.6    Blood smear review:  Moderate anisocytosis and poikilocytosis. There were no nucleated red blood cells. She had some microcytic red blood cells. There are some hypochromic red blood cells. I saw no inclusion bodies. She had no schistocytes or spherocytes. There was no polychromasia. White blood cells per normal in morphology maturation. There is no immature myeloid or lymphoid forms. She had no atypical lymphocytes. Platelets were adequate in number and size.  Pathology: None     Assessment and Plan:  [name] is a 70 year old white female. She has multiple medical problems.. She is on anticoagulation. I think that she probably has chronic low-grade GI bleeding. She clearly, in my opinion, is iron deficient. We will see what her iron studies show. Her blood smear is consistent with iron deficiency.  I also suspect that she has low erythropoietin levels. She may need to have ESA given to her.  She is on long-term chronic anticoagulation.  She does not need a bone marrow biopsy. I suppose that she may have some degree of myelodysplasia but her  blood smear really was not suggestive of this.  I spent about 45 minutes with her. I really think that we can get her blood count up. I will start her on some IV iron. She has iron deficiency. She has a chronic bleeding. I suspect that she probably has some iron malabsorption with all of her medications.  If this does not get her blood count up adequately, then we probably will have to consider Aranesp.  She agrees with my suggestions. She just wants to feel better and have more energy.  I will have her come back for 2 doses of Feraheme. We will then get her back to see [place] in about one month.

## 2015-07-18 LAB — ERYTHROPOIETIN: ERYTHROPOIETIN: 641.1 m[IU]/mL — AB (ref 2.6–18.5)

## 2015-07-20 LAB — HEMOGLOBINOPATHY EVALUATION
HEMOGLOBIN A2 QUANTITATION: 1.7 % (ref 0.7–3.1)
HGB C: 0 %
HGB S: 0 %
Hemoglobin F Quantitation: 0 % (ref 0.0–2.0)
Hgb A: 98.3 % — ABNORMAL HIGH (ref 94.0–98.0)

## 2015-07-22 ENCOUNTER — Ambulatory Visit (HOSPITAL_BASED_OUTPATIENT_CLINIC_OR_DEPARTMENT_OTHER): Payer: Medicare Other

## 2015-07-22 VITALS — BP 112/65 | HR 131 | Temp 98.2°F | Resp 16

## 2015-07-22 DIAGNOSIS — N183 Chronic kidney disease, stage 3 (moderate): Secondary | ICD-10-CM

## 2015-07-22 DIAGNOSIS — D631 Anemia in chronic kidney disease: Secondary | ICD-10-CM

## 2015-07-22 DIAGNOSIS — D500 Iron deficiency anemia secondary to blood loss (chronic): Secondary | ICD-10-CM | POA: Diagnosis present

## 2015-07-22 DIAGNOSIS — T45515S Adverse effect of anticoagulants, sequela: Secondary | ICD-10-CM

## 2015-07-22 MED ORDER — SODIUM CHLORIDE 0.9 % IV SOLN
510.0000 mg | Freq: Once | INTRAVENOUS | Status: AC
  Administered 2015-07-22: 510 mg via INTRAVENOUS
  Filled 2015-07-22: qty 17

## 2015-07-22 MED ORDER — SODIUM CHLORIDE 0.9 % IV SOLN
Freq: Once | INTRAVENOUS | Status: AC
  Administered 2015-07-22: 09:00:00 via INTRAVENOUS

## 2015-07-22 NOTE — Patient Instructions (Signed)

## 2015-07-23 DIAGNOSIS — M5136 Other intervertebral disc degeneration, lumbar region: Secondary | ICD-10-CM | POA: Diagnosis not present

## 2015-07-23 DIAGNOSIS — M4806 Spinal stenosis, lumbar region: Secondary | ICD-10-CM | POA: Diagnosis not present

## 2015-07-30 ENCOUNTER — Ambulatory Visit (HOSPITAL_BASED_OUTPATIENT_CLINIC_OR_DEPARTMENT_OTHER): Payer: Medicare Other

## 2015-07-30 VITALS — BP 121/78 | HR 110 | Temp 97.6°F | Resp 20

## 2015-07-30 DIAGNOSIS — D500 Iron deficiency anemia secondary to blood loss (chronic): Secondary | ICD-10-CM

## 2015-07-30 DIAGNOSIS — T45515S Adverse effect of anticoagulants, sequela: Secondary | ICD-10-CM

## 2015-07-30 DIAGNOSIS — N183 Chronic kidney disease, stage 3 unspecified: Secondary | ICD-10-CM

## 2015-07-30 DIAGNOSIS — D631 Anemia in chronic kidney disease: Secondary | ICD-10-CM

## 2015-07-30 MED ORDER — SODIUM CHLORIDE 0.9 % IV SOLN
510.0000 mg | Freq: Once | INTRAVENOUS | Status: AC
  Administered 2015-07-30: 510 mg via INTRAVENOUS
  Filled 2015-07-30: qty 17

## 2015-07-30 MED ORDER — SODIUM CHLORIDE 0.9 % IV SOLN
Freq: Once | INTRAVENOUS | Status: AC
  Administered 2015-07-30: 08:00:00 via INTRAVENOUS

## 2015-07-30 NOTE — Patient Instructions (Signed)

## 2015-08-04 ENCOUNTER — Emergency Department (HOSPITAL_BASED_OUTPATIENT_CLINIC_OR_DEPARTMENT_OTHER)
Admit: 2015-08-04 | Discharge: 2015-08-04 | Disposition: A | Discharge status: Home / Self Care | Payer: Medicare Other | Attending: Emergency Medicine | Admitting: Emergency Medicine

## 2015-08-04 ENCOUNTER — Emergency Department (HOSPITAL_COMMUNITY)
Admission: EM | Admit: 2015-08-04 | Discharge: 2015-08-04 | Disposition: A | Discharge status: Home / Self Care | Payer: Medicare Other | Attending: Emergency Medicine | Admitting: Emergency Medicine

## 2015-08-04 ENCOUNTER — Encounter (HOSPITAL_COMMUNITY): Payer: Self-pay | Admitting: Emergency Medicine

## 2015-08-04 DIAGNOSIS — Z79899 Other long term (current) drug therapy: Secondary | ICD-10-CM | POA: Diagnosis not present

## 2015-08-04 DIAGNOSIS — K219 Gastro-esophageal reflux disease without esophagitis: Secondary | ICD-10-CM | POA: Insufficient documentation

## 2015-08-04 DIAGNOSIS — M069 Rheumatoid arthritis, unspecified: Secondary | ICD-10-CM | POA: Diagnosis not present

## 2015-08-04 DIAGNOSIS — Z8669 Personal history of other diseases of the nervous system and sense organs: Secondary | ICD-10-CM | POA: Insufficient documentation

## 2015-08-04 DIAGNOSIS — I499 Cardiac arrhythmia, unspecified: Secondary | ICD-10-CM | POA: Insufficient documentation

## 2015-08-04 DIAGNOSIS — Z7952 Long term (current) use of systemic steroids: Secondary | ICD-10-CM | POA: Diagnosis not present

## 2015-08-04 DIAGNOSIS — F419 Anxiety disorder, unspecified: Secondary | ICD-10-CM | POA: Insufficient documentation

## 2015-08-04 DIAGNOSIS — I4891 Unspecified atrial fibrillation: Secondary | ICD-10-CM | POA: Diagnosis not present

## 2015-08-04 DIAGNOSIS — R2242 Localized swelling, mass and lump, left lower limb: Secondary | ICD-10-CM | POA: Diagnosis present

## 2015-08-04 DIAGNOSIS — Z7901 Long term (current) use of anticoagulants: Secondary | ICD-10-CM | POA: Insufficient documentation

## 2015-08-04 DIAGNOSIS — F329 Major depressive disorder, single episode, unspecified: Secondary | ICD-10-CM | POA: Diagnosis not present

## 2015-08-04 DIAGNOSIS — L03116 Cellulitis of left lower limb: Secondary | ICD-10-CM | POA: Insufficient documentation

## 2015-08-04 DIAGNOSIS — M5136 Other intervertebral disc degeneration, lumbar region: Secondary | ICD-10-CM | POA: Diagnosis not present

## 2015-08-04 DIAGNOSIS — E1122 Type 2 diabetes mellitus with diabetic chronic kidney disease: Secondary | ICD-10-CM | POA: Insufficient documentation

## 2015-08-04 DIAGNOSIS — N183 Chronic kidney disease, stage 3 (moderate): Secondary | ICD-10-CM | POA: Diagnosis not present

## 2015-08-04 DIAGNOSIS — D631 Anemia in chronic kidney disease: Secondary | ICD-10-CM | POA: Diagnosis not present

## 2015-08-04 DIAGNOSIS — I129 Hypertensive chronic kidney disease with stage 1 through stage 4 chronic kidney disease, or unspecified chronic kidney disease: Secondary | ICD-10-CM | POA: Diagnosis not present

## 2015-08-04 DIAGNOSIS — Z7984 Long term (current) use of oral hypoglycemic drugs: Secondary | ICD-10-CM | POA: Insufficient documentation

## 2015-08-04 DIAGNOSIS — M4806 Spinal stenosis, lumbar region: Secondary | ICD-10-CM | POA: Diagnosis not present

## 2015-08-04 DIAGNOSIS — M7989 Other specified soft tissue disorders: Secondary | ICD-10-CM

## 2015-08-04 LAB — CBC WITH DIFFERENTIAL/PLATELET
BASOS PCT: 0 %
Basophils Absolute: 0 10*3/uL (ref 0.0–0.1)
EOS ABS: 0.2 10*3/uL (ref 0.0–0.7)
Eosinophils Relative: 2 %
HEMATOCRIT: 38.5 % (ref 36.0–46.0)
HEMOGLOBIN: 10.5 g/dL — AB (ref 12.0–15.0)
LYMPHS ABS: 1.7 10*3/uL (ref 0.7–4.0)
LYMPHS PCT: 14 %
MCH: 19.4 pg — AB (ref 26.0–34.0)
MCHC: 27.3 g/dL — AB (ref 30.0–36.0)
MCV: 71.3 fL — ABNORMAL LOW (ref 78.0–100.0)
MONOS PCT: 8 %
Monocytes Absolute: 1 10*3/uL (ref 0.1–1.0)
NEUTROS ABS: 9.2 10*3/uL — AB (ref 1.7–7.7)
Neutrophils Relative %: 76 %
Platelets: 301 10*3/uL (ref 150–400)
RBC: 5.4 MIL/uL — ABNORMAL HIGH (ref 3.87–5.11)
RDW: 33.7 % — AB (ref 11.5–15.5)
WBC: 12.1 10*3/uL — ABNORMAL HIGH (ref 4.0–10.5)

## 2015-08-04 LAB — COMPREHENSIVE METABOLIC PANEL
ALK PHOS: 134 U/L — AB (ref 38–126)
ALT: 18 U/L (ref 14–54)
ANION GAP: 14 (ref 5–15)
AST: 27 U/L (ref 15–41)
Albumin: 3.9 g/dL (ref 3.5–5.0)
BILIRUBIN TOTAL: 1.3 mg/dL — AB (ref 0.3–1.2)
BUN: 16 mg/dL (ref 6–20)
CALCIUM: 9.5 mg/dL (ref 8.9–10.3)
CO2: 24 mmol/L (ref 22–32)
Chloride: 104 mmol/L (ref 101–111)
Creatinine, Ser: 0.77 mg/dL (ref 0.44–1.00)
GFR calc non Af Amer: >60 mL/min (ref >60)
Glucose, Bld: 119 mg/dL — ABNORMAL HIGH (ref 65–99)
Potassium: 4.1 mmol/L (ref 3.5–5.1)
Sodium: 142 mmol/L (ref 135–145)
TOTAL PROTEIN: 7.6 g/dL (ref 6.5–8.1)

## 2015-08-04 MED ORDER — CEPHALEXIN 500 MG PO CAPS: 500.0000 mg | ORAL_CAPSULE | Freq: Four times a day (QID) | ORAL | Status: DC

## 2015-08-04 NOTE — Discharge Instructions (Signed)
1. Medications: keflex, usual home medications 2. Treatment: rest, drink plenty of fluids 3. Follow Up: please followup with your primary doctor tomorrow (or return to the ED tomorrow) for recheck of your leg and for discussion of your diagnoses and further evaluation after today's visit; please return to the ER for high fever, increased redness or swelling, new or worsening symptoms   Cellulitis Cellulitis is an infection of the skin and the tissue under the skin. The infected area is usually red and tender. This happens most often in the arms and lower legs. HOME CARE   Take your antibiotic medicine as told. Finish the medicine even if you start to feel better.  Keep the infected arm or leg raised (elevated).  Put a warm cloth on the area up to 4 times per day.  Only take medicines as told by your doctor.  Keep all doctor visits as told. GET HELP IF:  You see red streaks on the skin coming from the infected area.  Your red area gets bigger or turns a dark color.  Your bone or joint under the infected area is painful after the skin heals.  Your infection comes back in the same area or different area.  You have a puffy (swollen) bump in the infected area.  You have new symptoms.  You have a fever. GET HELP RIGHT AWAY IF:   You feel very sleepy.  You throw up (vomit) or have watery poop (diarrhea).  You feel sick and have muscle aches and pains.   This information is not intended to replace advice given to you by your health care provider. Make sure you discuss any questions you have with your health care provider.   Document Released: 09/07/2007 Document Revised: 12/10/2014 Document Reviewed: 06/06/2011 Elsevier Interactive Patient Education [employer].

## 2015-08-04 NOTE — ED Notes (Signed)
Patient d/c'd self care.  F/U and medications discussed.  Patient verbalized understanding. 

## 2015-08-04 NOTE — Progress Notes (Addendum)
*  PRELIMINARY RESULTS* Vascular Ultrasound Left lower extremity venous duplex has been completed.  Preliminary findings: DVT noted in the left profunda femoral vein. Compared to study from 06/09/14, echogenicity appears unchanged. Cannot adequately determine aging of thrombus, but most likely not acute. No other DVT noted in the left leg. An area of mixed echoes is noted in the left groin posterior to the common femoral vein, which was also noted on 06/09/14.  Small left baker's cyst noted.   Landry Mellow, RDMS, RVT  08/04/2015, 6:56 PM

## 2015-08-04 NOTE — ED Notes (Signed)
Pt states that she was sent here by Dr. Tonita Cong for possible antibiotics for her L leg that is swollen. Alert and oriented.

## 2015-08-04 NOTE — ED Notes (Signed)
Nurse currently placing IV and drawing labs.

## 2015-08-04 NOTE — ED Provider Notes (Signed)
CSN: [id number]     Arrival date & time 08/04/15  1446 History   First MD Initiated Contact with Patient 08/04/15 1617     Chief Complaint  Patient presents with  . Leg Swelling    HPI   [name] is a 70 y.o. female with a PMH of atrial fibrillation on xarelto, HTN, HLD, DM who presents to the ED with left lower extremity edema. She reports her symptoms have been present for the past week. She denies exacerbating factors. She has not tried anything for symptom relief. She was seen by her PCP today prior to arrival and was sent to the ED for further evaluation given concern for infection. She denies fever, chills, chest pain, shortness of breath, abdominal pain, nausea, vomiting, numbness, weakness, paresthesia, recent injury. She notes history of DVT several years ago.    Past Medical History  Diagnosis Date  . Chest pain   . Atrial fibrillation ([place])   . Anemia     hx of  . Hypertension   . Rheumatoid arthritis(714.0)   . Gastroesophageal reflux disease   . Hyperlipidemia   . Hepatitis 1970  . Jaundice     age 79  . Actinic keratosis   . Atrial fibrillation ([place])   . Carpal tunnel syndrome   . Edema   . Hiatal hernia   . Memory loss   . Thyroid disease     multi nodular goiter  . Type II diabetes mellitus ([place])   . Anxiety   . Depression   . Iron deficiency anemia due to chronic blood loss 07/17/2015  . Anemia of chronic renal failure, stage 3 (moderate) 07/17/2015  . Anticoagulant causing adverse effect in therapeutic use 07/17/2015   Past Surgical History  Procedure Laterality Date  . Hand surgery  1995 and 1996    artificial joints both hands  . Cataract extraction Left   . Total hip arthroplasty Left 2009  . Total hip arthroplasty Right 04/03/2013    Procedure: RIGHT TOTAL HIP ARTHROPLASTY ANTERIOR APPROACH;  Surgeon: Gearlean Alf, MD;  Location: [facility];  Service: Orthopedics;  Laterality: Right;  . Joint replacement    . Esophagogastroduodenoscopy (egd) with  propofol N/A 02/20/2015    Procedure: ESOPHAGOGASTRODUODENOSCOPY (EGD) WITH PROPOFOL;  Surgeon: Carol Ada, MD;  Location: [facility];  Service: Endoscopy;  Laterality: N/A;  . Colonoscopy with propofol N/A 02/20/2015    Procedure: COLONOSCOPY WITH PROPOFOL;  Surgeon: Carol Ada, MD;  Location: [facility];  Service: Endoscopy;  Laterality: N/A;   Family History  Problem Relation Age of Onset  . Heart failure Sister   . Heart attack Neg Hx   . Stroke Father   . Hypertension Sister    Social History  Substance Use Topics  . Smoking status: Never Smoker   . Smokeless tobacco: Never Used  . Alcohol Use: No   OB History    No data available       Review of Systems  Constitutional: Negative for fever and chills.  Respiratory: Negative for shortness of breath.   Cardiovascular: Positive for leg swelling. Negative for chest pain.  Gastrointestinal: Negative for nausea, vomiting and abdominal pain.  Neurological: Negative for weakness and numbness.  All other systems reviewed and are negative.     Allergies  Review of patient's allergies indicates no known allergies.  Home Medications   Prior to Admission medications   Medication Sig Start Date End Date Taking? Authorizing Provider  buPROPion [facility] SR) 150 MG  12 hr tablet Take 150 mg by mouth 2 (two) times daily.     Yes Historical Provider, MD  Cholecalciferol (VITAMIN D3) 2000 UNITS TABS Take 2,000 Units by mouth daily.    Yes Historical Provider, MD  Cyanocobalamin (VITAMIN B 12 PO) Take 1,000 mcg by mouth daily.    Yes Historical Provider, MD  diltiazem (CARDIZEM CD) 240 MG 24 hr capsule Take 240 mg by mouth daily.   Yes Historical Provider, MD  esomeprazole (NEXIUM) 40 MG capsule Take 40 mg by mouth daily.     Yes Historical Provider, MD  fluticasone (FLONASE) 50 MCG/ACT nasal spray Place 1-2 sprays into both nostrils daily as needed for allergies.  04/12/15  Yes Historical Provider, MD  furosemide (LASIX) 40  MG tablet TAKE 1 TABLET BY MOUTH EVERY DAY 02/11/15  Yes Darlin Coco, MD  gabapentin (NEURONTIN) 300 MG capsule Take 300 mg by mouth 2 (two) times daily.   Yes Historical Provider, MD  glimepiride (AMARYL) 2 MG tablet Take 2 mg by mouth every morning. 11/03/14  Yes Historical Provider, MD  HYDROcodone-acetaminophen (NORCO/VICODIN) 5-325 MG tablet Take 1 tablet by mouth every 4 (four) hours as needed for moderate pain or severe pain.  01/25/15  Yes Historical Provider, MD  KLOR-CON 10 10 MEQ tablet TAKE 2 TABLETS BY MOUTH TWICE A DAY 01/27/15  Yes Darlin Coco, MD  leflunomide (ARAVA) 20 MG tablet Take 20 mg by mouth daily.  04/26/13  Yes Historical Provider, MD  metFORMIN (GLUCOPHAGE-XR) 500 MG 24 hr tablet Take 1,000 mg by mouth 2 (two) times daily. 09/02/14  Yes Historical Provider, MD  metoprolol (LOPRESSOR) 50 MG tablet Take 1.5 tablets (75 mg total) by mouth 2 (two) times daily. 03/05/15  Yes Brett Canales, PA-C  Multiple Vitamins-Minerals (PRESERVISION AREDS) CAPS Take 1 capsule by mouth 2 (two) times daily.   Yes Historical Provider, MD  naproxen sodium (ANAPROX) 220 MG tablet Take 440 mg by mouth daily as needed (pain).   Yes Historical Provider, MD  nitroGLYCERIN (NITROSTAT) 0.4 MG SL tablet Place 0.4 mg under the tongue every 5 (five) minutes as needed for chest pain (x 3 doses).    Yes Historical Provider, MD  Omega 3 1200 MG CAPS Take 1 capsule by mouth daily.    Yes Historical Provider, MD  ORENCIA 125 MG/ML SOSY Inject 125 mg into the skin every 7 (seven) days. Either Wed or Thurs. 01/27/15  Yes Historical Provider, MD  predniSONE (DELTASONE) 5 MG tablet Take 5 mg by mouth daily.   Yes Historical Provider, MD  rivaroxaban (XARELTO) 20 MG TABS tablet Take 1 tablet (20 mg total) by mouth daily with supper. 07/08/14  Yes Truitt Merle, MD  cephALEXin (KEFLEX) 500 MG capsule Take 1 capsule (500 mg total) by mouth 4 (four) times daily. 08/04/15   Marella Chimes, PA-C    BP 123/84 mmHg   Pulse 107  Temp(Src) 98.5 F (36.9 C) (Oral)  Resp 16  SpO2 100% Physical Exam  Constitutional: She is oriented to person, place, and time. She appears well-developed and well-nourished. No distress.  HENT:  Head: Normocephalic and atraumatic.  Right Ear: External ear normal.  Left Ear: External ear normal.  Nose: Nose normal.  Mouth/Throat: Uvula is midline, oropharynx is clear and moist and mucous membranes are normal.  Eyes: Conjunctivae, EOM and lids are normal. Pupils are equal, round, and reactive to light. Right eye exhibits no discharge. Left eye exhibits no discharge. No scleral icterus.  Neck: Normal  range of motion. Neck supple.  Cardiovascular: Normal rate, normal heart sounds, intact distal pulses and normal pulses.  An irregularly irregular rhythm present.  Pulmonary/Chest: Effort normal and breath sounds normal. No respiratory distress. She has no wheezes. She has no rales.  Abdominal: Soft. Normal appearance and bowel sounds are normal. She exhibits no distension and no mass. There is no tenderness. There is no rigidity, no rebound and no guarding.  Musculoskeletal: Normal range of motion. She exhibits edema. She exhibits no tenderness.  2+ edema to left lower extremity. Mild associated erythema. No significant heat. Full ROM of left lower extremity. Strength intact. Sensation to light touch intact.  Neurological: She is alert and oriented to person, place, and time. She has normal strength. No cranial nerve deficit or sensory deficit.  Skin: Skin is warm, dry and intact. No rash noted. She is not diaphoretic. No erythema. No pallor.  Psychiatric: She has a normal mood and affect. Her speech is normal and behavior is normal.  Nursing note and vitals reviewed.   ED Course  Procedures (including critical care time)  Labs Review Labs Reviewed  CBC WITH DIFFERENTIAL/PLATELET - Abnormal; Notable for the following:    WBC 12.1 (*)    RBC 5.40 (*)    Hemoglobin 10.5 (*)     MCV 71.3 (*)    MCH 19.4 (*)    MCHC 27.3 (*)    RDW 33.7 (*)    Neutro Abs 9.2 (*)    All other components within normal limits  COMPREHENSIVE METABOLIC PANEL - Abnormal; Notable for the following:    Glucose, Bld 119 (*)    Alkaline Phosphatase 134 (*)    Total Bilirubin 1.3 (*)    All other components within normal limits    Imaging Review No results found.   I have personally reviewed and evaluated these images and lab results as part of my medical decision-making.   EKG Interpretation None      MDM   Final diagnoses:  Cellulitis of left lower extremity    70 year old female presents with left lower extremity edema. She was evaluated prior to arrival and sent to the ED for further evaluation and management of this. She denies pain, though reports swelling for the past week. She denies history of similar symptoms. She denies fever, chills, chest pain, shortness of breath, abdominal pain, N/V numbness, weakness, paresthesia. She notes history of DVT several years ago. She currently takes xarelto and has not missed any doses.  Patient is afebrile. Mildly tachycardic, though per record review, this appears consistent with the patient's baseline. Heart irregular rhythm. Lungs clear to auscultation bilaterally. Abdomen soft, nontender, nondistended. 2+ edema to left lower extremity with mild associated erythema. No significant heat. Full range of motion of left lower extremity. Patient is neurovascularly intact.  EKG atrial fibrillation, HR 109. CBC remarkable for leukocytosis of 12.1, hemoglobin 10.5. CMP unremarkable. DVT study reveals likely chronic DVT in left profunda femoral vein.   Discussed findings with patient. She is non-toxic and well-appearing, feel she is stable for discharge at this time. Area of edema and erythema to left lower extremity marked. Plan to start antibiotics for cellulitis and have patient follow-up with her PCP or in the ED tomorrow for recheck. Return  precautions discussed. Patient verbalizes her understanding and is in agreement with plan. Patient discussed with and seen by Dr. Lita Mains.  BP 123/84 mmHg  Pulse 107  Temp(Src) 98.5 F (36.9 C) (Oral)  Resp 16  SpO2 100%  Marella Chimes, PA-C 08/05/15 [id number]  Julianne Rice, MD 08/12/15 [phone]

## 2015-08-05 ENCOUNTER — Ambulatory Visit: Payer: Medicare Other

## 2015-08-05 ENCOUNTER — Other Ambulatory Visit: Payer: Medicare Other

## 2015-08-05 ENCOUNTER — Ambulatory Visit: Payer: Medicare Other | Admitting: Hematology & Oncology

## 2015-08-07 ENCOUNTER — Ambulatory Visit: Payer: Medicare Other | Admitting: Hematology & Oncology

## 2015-08-07 ENCOUNTER — Ambulatory Visit: Payer: Medicare Other

## 2015-08-07 ENCOUNTER — Other Ambulatory Visit: Payer: Medicare Other

## 2015-08-17 ENCOUNTER — Ambulatory Visit: Payer: Medicare Other

## 2015-08-17 ENCOUNTER — Other Ambulatory Visit (HOSPITAL_BASED_OUTPATIENT_CLINIC_OR_DEPARTMENT_OTHER): Payer: Medicare Other

## 2015-08-17 ENCOUNTER — Encounter: Payer: Self-pay | Admitting: Family

## 2015-08-17 ENCOUNTER — Ambulatory Visit (HOSPITAL_BASED_OUTPATIENT_CLINIC_OR_DEPARTMENT_OTHER): Payer: Medicare Other | Admitting: Family

## 2015-08-17 VITALS — BP 120/70 | HR 101 | Temp 98.6°F | Resp 16 | Ht 67.0 in | Wt 177.0 lb

## 2015-08-17 DIAGNOSIS — I4891 Unspecified atrial fibrillation: Secondary | ICD-10-CM | POA: Diagnosis not present

## 2015-08-17 DIAGNOSIS — N183 Chronic kidney disease, stage 3 (moderate): Secondary | ICD-10-CM

## 2015-08-17 DIAGNOSIS — D500 Iron deficiency anemia secondary to blood loss (chronic): Secondary | ICD-10-CM

## 2015-08-17 DIAGNOSIS — L03116 Cellulitis of left lower limb: Secondary | ICD-10-CM | POA: Diagnosis not present

## 2015-08-17 DIAGNOSIS — D631 Anemia in chronic kidney disease: Secondary | ICD-10-CM | POA: Diagnosis not present

## 2015-08-17 DIAGNOSIS — T45515S Adverse effect of anticoagulants, sequela: Secondary | ICD-10-CM

## 2015-08-17 DIAGNOSIS — R0602 Shortness of breath: Secondary | ICD-10-CM | POA: Diagnosis not present

## 2015-08-17 DIAGNOSIS — D509 Iron deficiency anemia, unspecified: Secondary | ICD-10-CM

## 2015-08-17 LAB — CBC WITH DIFFERENTIAL (CANCER CENTER ONLY)
BASO#: 0 10*3/uL (ref 0.0–0.2)
BASO%: 0.2 % (ref 0.0–2.0)
EOS%: 1.9 % (ref 0.0–7.0)
Eosinophils Absolute: 0.2 10*3/uL (ref 0.0–0.5)
HCT: 40.1 % (ref 34.8–46.6)
HGB: 11.8 g/dL (ref 11.6–15.9)
LYMPH#: 1.9 10*3/uL (ref 0.9–3.3)
LYMPH%: 18 % (ref 14.0–48.0)
MCH: 22 pg — ABNORMAL LOW (ref 26.0–34.0)
MCHC: 29.4 g/dL — ABNORMAL LOW (ref 32.0–36.0)
MCV: 75 fL — AB (ref 81–101)
MONO#: 1.2 10*3/uL — AB (ref 0.1–0.9)
MONO%: 11.1 % (ref 0.0–13.0)
NEUT%: 68.8 % (ref 39.6–80.0)
NEUTROS ABS: 7.4 10*3/uL — AB (ref 1.5–6.5)
Platelets: 315 10*3/uL (ref 145–400)
RBC: 5.37 10*6/uL — AB (ref 3.70–5.32)
WBC: 10.8 10*3/uL — ABNORMAL HIGH (ref 3.9–10.0)

## 2015-08-17 LAB — CHCC SATELLITE - SMEAR

## 2015-08-17 LAB — TECHNOLOGIST REVIEW CHCC SATELLITE

## 2015-08-17 NOTE — Progress Notes (Signed)
Hematology and Oncology Follow Up Visit  NHIA MCNICHOL [id number] 1946-01-16 70 y.o. 08/17/2015   Principle Diagnosis:  Iron deficiency anemia   Current Therapy:   IV iron as indicated    Interim History:  [name] is here today for a follow-up. She received 2 doses of Feraheme in April and she has responded nicely. Her symptoms have resolved. Her Hgb is now up to 11.8 with an MCV of 75.  She denies fatigue. No fever, chills, n/v, cough, rash, dizziness, chest pain, palpitations, abdominal pain or changes in bowel or bladder habits.  She has occasional SOB due to the atrial fib. This is unchanged. She has had no episodes of bleeding or bruising on Xarelto.  No lymphadenopathy found on exam.  No numbness or tingling in her extremities. She is currently on Keflex for cellulitis in the left lower extremity. She has left lower leg swelling and +2 pitting edema. Her pedal pulse is +2. No tenderness and she states that the redness has resolved.  She has maintained a good appetite and is staying well hydrated. Her weight is stable.   Medications:    Medication List       This list is accurate as of: 08/17/15  1:54 PM.  Always use your most recent med list.               buPROPion 150 MG 12 hr tablet  Commonly known as:  WELLBUTRIN SR  Take 150 mg by mouth 2 (two) times daily.     cephALEXin 500 MG capsule  Commonly known as:  KEFLEX  Take 1 capsule (500 mg total) by mouth 4 (four) times daily.     diltiazem 240 MG 24 hr capsule  Commonly known as:  CARDIZEM CD  Take 240 mg by mouth daily.     esomeprazole 40 MG capsule  Commonly known as:  NEXIUM  Take 40 mg by mouth daily.     fluticasone 50 MCG/ACT nasal spray  Commonly known as:  FLONASE  Place 1-2 sprays into both nostrils daily as needed for allergies.     furosemide 40 MG tablet  Commonly known as:  LASIX  TAKE 1 TABLET BY MOUTH EVERY DAY     gabapentin 300 MG capsule  Commonly known as:  NEURONTIN  Take 300  mg by mouth 2 (two) times daily.     glimepiride 2 MG tablet  Commonly known as:  AMARYL  Take 2 mg by mouth every morning.     HYDROcodone-acetaminophen 5-325 MG tablet  Commonly known as:  NORCO/VICODIN  Take 1 tablet by mouth every 4 (four) hours as needed for moderate pain or severe pain.     KLOR-CON 10 10 MEQ tablet  Generic drug:  potassium chloride  TAKE 2 TABLETS BY MOUTH TWICE A DAY     leflunomide 20 MG tablet  Commonly known as:  ARAVA  Take 20 mg by mouth daily.     metFORMIN 500 MG 24 hr tablet  Commonly known as:  GLUCOPHAGE-XR  Take 1,000 mg by mouth 2 (two) times daily.     metoprolol 50 MG tablet  Commonly known as:  LOPRESSOR  Take 1.5 tablets (75 mg total) by mouth 2 (two) times daily.     nitroGLYCERIN 0.4 MG SL tablet  Commonly known as:  NITROSTAT  Place 0.4 mg under the tongue every 5 (five) minutes as needed for chest pain (x 3 doses).     Omega 3 1200 MG Caps  Take 1 capsule by mouth daily.     ORENCIA 125 MG/ML Sosy  Generic drug:  Abatacept  Inject 125 mg into the skin every 7 (seven) days. Either Wed or Thurs.     predniSONE 5 MG tablet  Commonly known as:  DELTASONE  Take 5 mg by mouth daily.     PRESERVISION AREDS Caps  Take 1 capsule by mouth 2 (two) times daily.     rivaroxaban 20 MG Tabs tablet  Commonly known as:  XARELTO  Take 1 tablet (20 mg total) by mouth daily with supper.     VITAMIN B 12 PO  Take 1,000 mcg by mouth daily.     Vitamin D3 2000 units Tabs  Take 2,000 Units by mouth daily.        Allergies: No Known Allergies  Past Medical History, Surgical history, Social history, and Family History were reviewed and updated.  Review of Systems: All other 10 point review of systems is negative.   Physical Exam:  height is 5\' 7"  (1.702 m) and weight is 177 lb (80.287 kg). Her oral temperature is 98.6 F (37 C). Her blood pressure is 120/70 and her pulse is 101. Her respiration is 16.   Wt Readings from Last 3  Encounters:  08/17/15 177 lb (80.287 kg)  07/17/15 172 lb (78.019 kg)  04/09/15 160 lb 12.8 oz (72.938 kg)    Ocular: Sclerae unicteric, pupils equal, round and reactive to light Ear-nose-throat: Oropharynx clear, dentition fair Lymphatic: No cervical supraclavicular or axillary adenopathy Lungs no rales or rhonchi, good excursion bilaterally Heart regular rate and rhythm, no murmur appreciated Abd soft, nontender, positive bowel sounds, no liver or spleen tip palpated on exam, no fluid wave MSK no focal spinal tenderness, no joint edema Neuro: non-focal, well-oriented, appropriate affect Breasts:   Lab Results  Component Value Date   WBC 12.1* 08/04/2015   HGB 10.5* 08/04/2015   HCT 38.5 08/04/2015   MCV 71.3* 08/04/2015   PLT 301 08/04/2015   Lab Results  Component Value Date   FERRITIN 33 07/17/2015   IRON <11* 07/17/2015   TIBC 473* 07/17/2015   UIBC Not calculated due to Iron < linear range 07/17/2015   IRONPCTSAT Not calculated due to Iron < linear range. 07/17/2015   Lab Results  Component Value Date   RETICCTPCT 1.83 04/15/2014   RBC 5.40* 08/04/2015   RETICCTABS 98.09* 04/15/2014   No results found for: KPAFRELGTCHN, LAMBDASER, KAPLAMBRATIO No results found for: IGGSERUM, IGA, IGMSERUM No results found for: Odetta Pink, SPEI   Chemistry      Component Value Date/Time   NA 142 08/04/2015 1659   NA 138 07/17/2015 1325   NA 141 07/08/2014 0935   K 4.1 08/04/2015 1659   K 3.7 07/17/2015 1325   K 4.1 07/08/2014 0935   CL 104 08/04/2015 1659   CL 100 07/17/2015 1325   CO2 24 08/04/2015 1659   CO2 26 07/17/2015 1325   CO2 26 07/08/2014 0935   BUN 16 08/04/2015 1659   BUN 16 07/17/2015 1325   BUN 14.1 07/08/2014 0935   CREATININE 0.77 08/04/2015 1659   CREATININE 0.6 07/17/2015 1325   CREATININE 0.7 07/08/2014 0935      Component Value Date/Time   CALCIUM 9.5 08/04/2015 1659   CALCIUM 9.6  07/17/2015 1325   CALCIUM 9.3 07/08/2014 0935   ALKPHOS 134* 08/04/2015 1659   ALKPHOS 121* 07/17/2015 1325   ALKPHOS 115 07/08/2014 0935  AST 27 08/04/2015 1659   AST 18 07/17/2015 1325   AST 19 07/08/2014 0935   ALT 18 08/04/2015 1659   ALT 11 07/17/2015 1325   ALT 22 07/08/2014 0935   BILITOT 1.3* 08/04/2015 1659   BILITOT 0.90 07/17/2015 1325   BILITOT 0.47 07/08/2014 0935     Impression and Plan: [name] is a very pleasant 70 yo white female with history of iron deficiency anemia. She received 2 doses of Feraheme and has had a wonderful response. She is asymptomatic at this time and her Hgb is now 11.8.  We will see what her iron studies show and bring her in later this week for an infusion if needed.  We will plan to see her back in 2 months for lab work and follow-up.  She will contact us with any questions or concerns. We can certainly see her sooner if need be.   Eliezer Bottom, NP 5/15/20171:54 PM

## 2015-08-17 NOTE — Progress Notes (Signed)
No treatment needed per Laverna Peace NP.

## 2015-08-18 ENCOUNTER — Telehealth: Payer: Self-pay | Admitting: *Deleted

## 2015-08-18 LAB — IRON AND TIBC
%SAT: 9 % — AB (ref 21–57)
IRON: 26 ug/dL — AB (ref 41–142)
TIBC: 300 ug/dL (ref 236–444)
UIBC: 274 ug/dL (ref 120–384)

## 2015-08-18 LAB — RETICULOCYTES: RETICULOCYTE COUNT: 1.3 % (ref 0.6–2.6)

## 2015-08-18 LAB — FERRITIN: FERRITIN: 252 ng/mL (ref 9–269)

## 2015-08-18 NOTE — Telephone Encounter (Addendum)
Patient doesn't understand why iron levels would be low after two infusions and daily po iron. She would like to talk to the provider before scheduling any infusions. Message sent to [facility] NP.    ----- Message from Eliezer Bottom, NP sent at 08/18/2015 11:31 AM EDT ----- Regarding: iron  Her iron saturation is low. She will need 2 doses of iron scheduled over the next 2 weeks please. Thank you!!!  Sarah  ----- Message -----    From: Volanda Napoleon, MD    Sent: 08/18/2015  10:22 AM      To: Eliezer Bottom, NP    ----- Message -----    From: [facility] Interface    Sent: 08/17/2015   2:00 PM      To: Volanda Napoleon, MD

## 2015-08-19 ENCOUNTER — Telehealth: Payer: Self-pay | Admitting: Family

## 2015-08-19 NOTE — Telephone Encounter (Signed)
I spoke with [name] over the phone and went through her lab work in detail with her. Also a copy of her labs have been mailed to her. We will get her scheduled for iron next week. She is in agreement with the plan.

## 2015-08-24 NOTE — Progress Notes (Signed)
HPI: FU Atrial fibrillation. Echocardiogram July 2016 showed normal LV systolic function. Nuclear study December 2016 showed normal perfusion. Not gated because of atrial fibrillation. Patient had digoxin discontinued and metoprolol decreased previously because of bradycardia. Since she was last seen She denies dyspnea, chest pain, palpitations or syncope. No bleeding. She does have edema in her left lower extremity. Recent venous Dopplers showed indeterminate age DVT unchanged compared to study of 06/19/2014.   Current Outpatient Prescriptions  Medication Sig Dispense Refill  . buPROPion (WELLBUTRIN SR) 150 MG 12 hr tablet Take 150 mg by mouth 2 (two) times daily.      . cephALEXin (KEFLEX) 500 MG capsule Take 1 capsule (500 mg total) by mouth 4 (four) times daily. 40 capsule 0  . Cholecalciferol (VITAMIN D3) 2000 UNITS TABS Take 2,000 Units by mouth daily.     . Cyanocobalamin (VITAMIN B 12 PO) Take 1,000 mcg by mouth daily.     [name] diltiazem (CARDIZEM CD) 240 MG 24 hr capsule Take 240 mg by mouth daily.    [name] esomeprazole (NEXIUM) 40 MG capsule Take 40 mg by mouth daily.      . fluticasone (FLONASE) 50 MCG/ACT nasal spray Place 1-2 sprays into both nostrils daily as needed for allergies.     . furosemide (LASIX) 40 MG tablet TAKE 1 TABLET BY MOUTH EVERY DAY 90 tablet 2  . gabapentin (NEURONTIN) 300 MG capsule Take 300 mg by mouth 2 (two) times daily.    [name] glimepiride (AMARYL) 2 MG tablet Take 2 mg by mouth every morning.  3  . HYDROcodone-acetaminophen (NORCO/VICODIN) 5-325 MG tablet Take 1 tablet by mouth every 4 (four) hours as needed for moderate pain or severe pain.   0  . KLOR-CON 10 10 MEQ tablet TAKE 2 TABLETS BY MOUTH TWICE A DAY 120 tablet 10  . leflunomide (ARAVA) 20 MG tablet Take 20 mg by mouth daily.     . metFORMIN (GLUCOPHAGE-XR) 500 MG 24 hr tablet Take 1,000 mg by mouth 2 (two) times daily.  1  . metoprolol (LOPRESSOR) 50 MG tablet Take 1.5 tablets (75 mg total) by mouth  2 (two) times daily. 90 tablet 11  . Multiple Vitamins-Minerals (PRESERVISION AREDS) CAPS Take 1 capsule by mouth 2 (two) times daily.    . nitroGLYCERIN (NITROSTAT) 0.4 MG SL tablet Place 0.4 mg under the tongue every 5 (five) minutes as needed for chest pain (x 3 doses).     . Omega 3 1200 MG CAPS Take 1 capsule by mouth daily.     [name] ORENCIA 125 MG/ML SOSY Inject 125 mg into the skin every 7 (seven) days. Either Wed or Thurs.    . predniSONE (DELTASONE) 5 MG tablet Take 5 mg by mouth daily.    . rivaroxaban (XARELTO) 20 MG TABS tablet Take 1 tablet (20 mg total) by mouth daily with supper. 30 tablet 3   No current facility-administered medications for this visit.     Past Medical History  Diagnosis Date  . Chest pain   . Atrial fibrillation ([place])   . Anemia     hx of  . Hypertension   . Rheumatoid arthritis(714.0)   . Gastroesophageal reflux disease   . Hyperlipidemia   . Hepatitis 1970  . Jaundice     age 12  . Actinic keratosis   . Atrial fibrillation ([place])   . Carpal tunnel syndrome   . Edema   . Hiatal hernia   . Memory loss   .  Thyroid disease     multi nodular goiter  . Type II diabetes mellitus ([place])   . Anxiety   . Depression   . Iron deficiency anemia due to chronic blood loss 07/17/2015  . Anemia of chronic renal failure, stage 3 (moderate) 07/17/2015  . Anticoagulant causing adverse effect in therapeutic use 07/17/2015    Past Surgical History  Procedure Laterality Date  . Hand surgery  1995 and 1996    artificial joints both hands  . Cataract extraction Left   . Total hip arthroplasty Left 2009  . Total hip arthroplasty Right 04/03/2013    Procedure: RIGHT TOTAL HIP ARTHROPLASTY ANTERIOR APPROACH;  Surgeon: Gearlean Alf, MD;  Location: [facility];  Service: Orthopedics;  Laterality: Right;  . Joint replacement    . Esophagogastroduodenoscopy (egd) with propofol N/A 02/20/2015    Procedure: ESOPHAGOGASTRODUODENOSCOPY (EGD) WITH PROPOFOL;  Surgeon: Carol Ada, MD;  Location: [facility];  Service: Endoscopy;  Laterality: N/A;  . Colonoscopy with propofol N/A 02/20/2015    Procedure: COLONOSCOPY WITH PROPOFOL;  Surgeon: Carol Ada, MD;  Location: [facility];  Service: Endoscopy;  Laterality: N/A;    Social History   Social History  . Marital Status: Divorced    Spouse Name: N/A  . Number of Children: N/A  . Years of Education: N/A   Occupational History  . Retired    Social History Main Topics  . Smoking status: Never Smoker   . Smokeless tobacco: Never Used  . Alcohol Use: No  . Drug Use: No  . Sexual Activity: Not Currently   Other Topics Concern  . Not on file   Social History Narrative    Family History  Problem Relation Age of Onset  . Heart failure Sister   . Heart attack Neg Hx   . Stroke Father   . Hypertension Sister     ROS: Back pain but no fevers or chills, productive cough, hemoptysis, dysphasia, odynophagia, melena, hematochezia, dysuria, hematuria, rash, seizure activity, orthopnea, PND, claudication. Remaining systems are negative.  Physical Exam: Well-developed well-nourished in no acute distress.  Skin is warm and dry.  HEENT is normal.  Neck is supple.  Chest is clear to auscultation with normal expansion.  Cardiovascular exam is irregular Abdominal exam nontender or distended. No masses palpated. Extremities show 1+ edema on the left. neuro grossly intact  ECG 08/04/2015-atrial fibrillation with nonspecific ST changes.

## 2015-08-25 ENCOUNTER — Ambulatory Visit (INDEPENDENT_AMBULATORY_CARE_PROVIDER_SITE_OTHER): Payer: Medicare Other | Admitting: Cardiology

## 2015-08-25 ENCOUNTER — Telehealth: Payer: Self-pay | Admitting: *Deleted

## 2015-08-25 ENCOUNTER — Encounter: Payer: Self-pay | Admitting: Cardiology

## 2015-08-25 VITALS — BP 134/76 | HR 93 | Ht 67.0 in | Wt 180.0 lb

## 2015-08-25 DIAGNOSIS — I4821 Permanent atrial fibrillation: Secondary | ICD-10-CM

## 2015-08-25 DIAGNOSIS — I482 Chronic atrial fibrillation: Secondary | ICD-10-CM | POA: Diagnosis not present

## 2015-08-25 DIAGNOSIS — Z01810 Encounter for preprocedural cardiovascular examination: Secondary | ICD-10-CM | POA: Diagnosis not present

## 2015-08-25 DIAGNOSIS — I10 Essential (primary) hypertension: Secondary | ICD-10-CM

## 2015-08-25 NOTE — Assessment & Plan Note (Signed)
PatientWill require back surgery. She is not having chest pain and previous nuclear study showed no ischemia. She may proceed without further cardiac testing. Discontinue xarelto 3 days prior to procedure and resume after when hemostasis achieved.

## 2015-08-25 NOTE — Patient Instructions (Signed)
Your physician wants you to follow-up in: 6 MONTHS WITH DR CRENSHAW You will receive a reminder letter in the mail two months in advance. If you don't receive a letter, please call our office to schedule the follow-up appointment.   If you need a refill on your cardiac medications before your next appointment, please call your pharmacy.  

## 2015-08-25 NOTE — Assessment & Plan Note (Signed)
Blood pressure controlled. Continue present medications. 

## 2015-08-25 NOTE — Telephone Encounter (Signed)
Okay fro surgery, lumbar decompression, from a cardiac standpoint and the okay to hold xarelto 3 days prior to the procedure and resume after when hematosis achieved per dr Stanford Breed faxed to the number provided.

## 2015-08-25 NOTE — Assessment & Plan Note (Signed)
Patient has edema predominantly in left lower extremity.This is likely related to previous DVT. Continue anticoagulation. We discussed keeping foot elevated and compression hose. Continue present dose of diuretic.

## 2015-08-25 NOTE — Assessment & Plan Note (Signed)
Patient has permanent atrial fibrillation. Continue Cardizem and metoprolol for rate control. Continue xarelto.

## 2015-08-27 ENCOUNTER — Ambulatory Visit: Payer: Medicare Other

## 2015-09-01 ENCOUNTER — Encounter: Payer: Self-pay | Admitting: Vascular Surgery

## 2015-09-01 DIAGNOSIS — I82512 Chronic embolism and thrombosis of left femoral vein: Secondary | ICD-10-CM | POA: Diagnosis not present

## 2015-09-01 DIAGNOSIS — D649 Anemia, unspecified: Secondary | ICD-10-CM | POA: Diagnosis not present

## 2015-09-01 DIAGNOSIS — E119 Type 2 diabetes mellitus without complications: Secondary | ICD-10-CM | POA: Diagnosis not present

## 2015-09-01 DIAGNOSIS — Z0000 Encounter for general adult medical examination without abnormal findings: Secondary | ICD-10-CM | POA: Diagnosis not present

## 2015-09-01 DIAGNOSIS — I10 Essential (primary) hypertension: Secondary | ICD-10-CM | POA: Diagnosis not present

## 2015-09-01 DIAGNOSIS — F329 Major depressive disorder, single episode, unspecified: Secondary | ICD-10-CM | POA: Diagnosis not present

## 2015-09-01 DIAGNOSIS — M7989 Other specified soft tissue disorders: Secondary | ICD-10-CM | POA: Diagnosis not present

## 2015-09-01 DIAGNOSIS — K219 Gastro-esophageal reflux disease without esophagitis: Secondary | ICD-10-CM | POA: Diagnosis not present

## 2015-09-07 ENCOUNTER — Encounter: Payer: Medicare Other | Admitting: Vascular Surgery

## 2015-09-10 ENCOUNTER — Ambulatory Visit (INDEPENDENT_AMBULATORY_CARE_PROVIDER_SITE_OTHER): Payer: Medicare Other | Admitting: Ophthalmology

## 2015-09-10 ENCOUNTER — Encounter: Payer: Self-pay | Admitting: Vascular Surgery

## 2015-09-11 ENCOUNTER — Telehealth: Payer: Self-pay | Admitting: Hematology & Oncology

## 2015-09-11 NOTE — Telephone Encounter (Signed)
Pt canceled her 6/13 appt due to car issues. Will cb when ready.

## 2015-09-14 ENCOUNTER — Ambulatory Visit: Payer: Medicare Other

## 2015-09-14 DIAGNOSIS — Z79899 Other long term (current) drug therapy: Secondary | ICD-10-CM | POA: Diagnosis not present

## 2015-09-14 DIAGNOSIS — M2550 Pain in unspecified joint: Secondary | ICD-10-CM | POA: Diagnosis not present

## 2015-09-14 DIAGNOSIS — Z7952 Long term (current) use of systemic steroids: Secondary | ICD-10-CM | POA: Diagnosis not present

## 2015-09-14 DIAGNOSIS — D500 Iron deficiency anemia secondary to blood loss (chronic): Secondary | ICD-10-CM | POA: Diagnosis not present

## 2015-09-14 DIAGNOSIS — M5030 Other cervical disc degeneration, unspecified cervical region: Secondary | ICD-10-CM | POA: Diagnosis not present

## 2015-09-14 DIAGNOSIS — M0589 Other rheumatoid arthritis with rheumatoid factor of multiple sites: Secondary | ICD-10-CM | POA: Diagnosis not present

## 2015-09-14 DIAGNOSIS — Z13820 Encounter for screening for osteoporosis: Secondary | ICD-10-CM | POA: Diagnosis not present

## 2015-09-14 DIAGNOSIS — M150 Primary generalized (osteo)arthritis: Secondary | ICD-10-CM | POA: Diagnosis not present

## 2015-09-14 DIAGNOSIS — R600 Localized edema: Secondary | ICD-10-CM | POA: Diagnosis not present

## 2015-09-15 ENCOUNTER — Ambulatory Visit (INDEPENDENT_AMBULATORY_CARE_PROVIDER_SITE_OTHER): Payer: Medicare Other | Admitting: Vascular Surgery

## 2015-09-15 ENCOUNTER — Encounter: Payer: Self-pay | Admitting: Vascular Surgery

## 2015-09-15 ENCOUNTER — Ambulatory Visit: Payer: Medicare Other

## 2015-09-15 VITALS — BP 137/81 | HR 111 | Temp 97.6°F | Resp 18 | Ht 67.0 in | Wt 180.0 lb

## 2015-09-15 DIAGNOSIS — I83892 Varicose veins of left lower extremities with other complications: Secondary | ICD-10-CM

## 2015-09-15 NOTE — Progress Notes (Signed)
Subjective:     Patient ID: [name], female   DOB: Nov 22, 1945, 70 y.o.   MRN: [id number]  HPI this 71 year old female was referred by Leonia Reader for evaluation of possible DVT in the left leg. Patient states that she developed edema in the entire left leg April 23. She does have a remote history of DVT in the left leg in the groin area she states about 3 years ago. She states she's had no swelling since that time. The swelling over the past few months has been persistent. She did have a formal duplex exam performed which revealed no evidence of DVT other than what appears to be a chronic clot in the left profunda femoris vein-deep femoral vein. She has tried wearing elastic compression stockings in the past but not since having this edema. That is been unsuccessful. She does not elevate her legs on a regular basis. She is on chronic anticoagulation for A. fib. She has no history of pulmonary embolus.  Past Medical History  Diagnosis Date  . Chest pain   . Atrial fibrillation ([place])   . Anemia     hx of  . Hypertension   . Rheumatoid arthritis(714.0)   . Gastroesophageal reflux disease   . Hyperlipidemia   . Hepatitis 1970  . Jaundice     age 89  . Actinic keratosis   . Atrial fibrillation ([place])   . Carpal tunnel syndrome   . Edema   . Hiatal hernia   . Memory loss   . Thyroid disease     multi nodular goiter  . Type II diabetes mellitus ([place])   . Anxiety   . Depression   . Iron deficiency anemia due to chronic blood loss 07/17/2015  . Anemia of chronic renal failure, stage 3 (moderate) 07/17/2015  . Anticoagulant causing adverse effect in therapeutic use 07/17/2015    Social History  Substance Use Topics  . Smoking status: Never Smoker   . Smokeless tobacco: Never Used  . Alcohol Use: No    Family History  Problem Relation Age of Onset  . Heart failure Sister   . Heart attack Neg Hx   . Stroke Father   . Hypertension Sister     No Known Allergies   Current  outpatient prescriptions:  .  buPROPion (WELLBUTRIN SR) 150 MG 12 hr tablet, Take 150 mg by mouth 2 (two) times daily.  , Disp: , Rfl:  .  Cholecalciferol (VITAMIN D3) 2000 UNITS TABS, Take 2,000 Units by mouth daily. , Disp: , Rfl:  .  Cyanocobalamin (VITAMIN B 12 PO), Take 1,000 mcg by mouth daily. , Disp: , Rfl:  .  diltiazem (CARDIZEM CD) 240 MG 24 hr capsule, Take 240 mg by mouth daily., Disp: , Rfl:  .  esomeprazole (NEXIUM) 40 MG capsule, Take 40 mg by mouth daily.  , Disp: , Rfl:  .  fluticasone (FLONASE) 50 MCG/ACT nasal spray, Place 1-2 sprays into both nostrils daily as needed for allergies. , Disp: , Rfl:  .  furosemide (LASIX) 40 MG tablet, TAKE 1 TABLET BY MOUTH EVERY DAY, Disp: 90 tablet, Rfl: 2 .  gabapentin (NEURONTIN) 300 MG capsule, Take 300 mg by mouth 2 (two) times daily., Disp: , Rfl:  .  glimepiride (AMARYL) 2 MG tablet, Take 2 mg by mouth every morning., Disp: , Rfl: 3 .  KLOR-CON 10 10 MEQ tablet, TAKE 2 TABLETS BY MOUTH TWICE A DAY, Disp: 120 tablet, Rfl: 10 .  leflunomide (ARAVA) 20  MG tablet, Take 20 mg by mouth daily. , Disp: , Rfl:  .  metFORMIN (GLUCOPHAGE-XR) 500 MG 24 hr tablet, Take 1,000 mg by mouth 2 (two) times daily., Disp: , Rfl: 1 .  metoprolol (LOPRESSOR) 50 MG tablet, Take 1.5 tablets (75 mg total) by mouth 2 (two) times daily., Disp: 90 tablet, Rfl: 11 .  Multiple Vitamins-Minerals (PRESERVISION AREDS) CAPS, Take 1 capsule by mouth 2 (two) times daily., Disp: , Rfl:  .  nitroGLYCERIN (NITROSTAT) 0.4 MG SL tablet, Place 0.4 mg under the tongue every 5 (five) minutes as needed for chest pain (x 3 doses). , Disp: , Rfl:  .  Omega 3 1200 MG CAPS, Take 1 capsule by mouth daily. , Disp: , Rfl:  .  ORENCIA 125 MG/ML SOSY, Inject 125 mg into the skin every 7 (seven) days. Either Wed or Thurs., Disp: , Rfl:  .  predniSONE (DELTASONE) 5 MG tablet, Take 5 mg by mouth daily., Disp: , Rfl:  .  rivaroxaban (XARELTO) 20 MG TABS tablet, Take 1 tablet (20 mg total) by  mouth daily with supper., Disp: 30 tablet, Rfl: 3 .  cephALEXin (KEFLEX) 500 MG capsule, Take 1 capsule (500 mg total) by mouth 4 (four) times daily. (Patient not taking: Reported on 09/15/2015), Disp: 40 capsule, Rfl: 0 .  HYDROcodone-acetaminophen (NORCO/VICODIN) 5-325 MG tablet, Take 1 tablet by mouth every 4 (four) hours as needed for moderate pain or severe pain. Reported on 09/15/2015, Disp: , Rfl: 0  Filed Vitals:   09/15/15 1443  BP: 137/81  Pulse: 111  Temp: 97.6 F (36.4 C)  Resp: 18  Height: 5\' 7"  (1.702 m)  Weight: 180 lb (81.647 kg)  SpO2: 100%    Body mass index is 28.19 kg/(m^2).           Review of Systems has history of A. fib on chronic anticoagulation-pRADAXA    denies chest pain, dyspnea on exertion does have diabetes, chronic A. fib, anemia, degenerative joint disease, spinal stenosis.    Physical Exam BP 137/81 mmHg  Pulse 111  Temp(Src) 97.6 F (36.4 C)  Resp 18  Ht 5\' 7"  (1.702 m)  Wt 180 lb (81.647 kg)  BMI 28.19 kg/m2  SpO2 100%    Gen.-alert and oriented x3 in no apparent distress HEENT normal for age Lungs no rhonchi or wheezing Cardiovascular regular rhythm no murmurs carotid pulses 3+ palpable no bruits audible Abdomen soft nontender no palpable masses Musculoskeletal free of  major deformities Skin clear -no rashes Neurologic normal Lower extremities 3+ femoral and dorsalis pedis pulses palpable bilaterally with 2+ edema left leg from inguinal crease to foot. No evidence of definite cellulitis. No edema in the right leg. No varicosities noted.  I reviewed the report of the venous duplex exam which was performed on 08/04/2015 at [facility]. There is evidence of what appears to be a chronic thrombus in the profunda femoris vein on the left but not in the superficial or primary deep channels.       Assessment:      edema left leg etiology unknown Evidence of chronic localized thrombus and profunda femoris vein on the  left Patient on chronic anticoagulation for atrial fibrillation Degenerative joint disease needs lumbar spine surgery  Plan:     #1 elevate foot of bed 3-4 inches #2 fitted today for long-leg elastic compression stockings 20-30 millimeter gradient to be put on first thing in the morning #3 continue anticoagulation-Pradaxa which she is currently taking #4 nothing  further to add from a vascular standpoint. If she does have DVT she is being treated appropriately with elevation stockings and anticoagulation but no evidence of acute DVT at this time-etiology of edema is unclear

## 2015-09-29 DIAGNOSIS — D500 Iron deficiency anemia secondary to blood loss (chronic): Secondary | ICD-10-CM | POA: Diagnosis not present

## 2015-09-29 DIAGNOSIS — M25512 Pain in left shoulder: Secondary | ICD-10-CM | POA: Diagnosis not present

## 2015-09-29 DIAGNOSIS — M150 Primary generalized (osteo)arthritis: Secondary | ICD-10-CM | POA: Diagnosis not present

## 2015-09-29 DIAGNOSIS — M0589 Other rheumatoid arthritis with rheumatoid factor of multiple sites: Secondary | ICD-10-CM | POA: Diagnosis not present

## 2015-09-29 DIAGNOSIS — M2550 Pain in unspecified joint: Secondary | ICD-10-CM | POA: Diagnosis not present

## 2015-09-29 DIAGNOSIS — Z79899 Other long term (current) drug therapy: Secondary | ICD-10-CM | POA: Diagnosis not present

## 2015-09-29 DIAGNOSIS — Z7952 Long term (current) use of systemic steroids: Secondary | ICD-10-CM | POA: Diagnosis not present

## 2015-09-29 DIAGNOSIS — M5030 Other cervical disc degeneration, unspecified cervical region: Secondary | ICD-10-CM | POA: Diagnosis not present

## 2015-10-09 ENCOUNTER — Other Ambulatory Visit: Payer: Self-pay

## 2015-10-09 MED ORDER — METOPROLOL TARTRATE 50 MG PO TABS: 75.0000 mg | ORAL_TABLET | Freq: Two times a day (BID) | ORAL | Status: DC

## 2015-10-12 ENCOUNTER — Telehealth: Payer: Self-pay | Admitting: *Deleted

## 2015-10-12 NOTE — Telephone Encounter (Signed)
PAtient called our office to tell us that she did not want to get any more IV iron. Because since her last one she has had constant problems with swelling in legs.  PAtient to see Laverna Peace NP next week. PAtient will let her know.  Note placed on chart for patient

## 2015-10-19 ENCOUNTER — Ambulatory Visit: Payer: Medicare Other | Admitting: Family

## 2015-10-19 ENCOUNTER — Other Ambulatory Visit: Payer: Medicare Other

## 2015-11-04 DIAGNOSIS — Z6827 Body mass index (BMI) 27.0-27.9, adult: Secondary | ICD-10-CM | POA: Diagnosis not present

## 2015-11-04 DIAGNOSIS — M5127 Other intervertebral disc displacement, lumbosacral region: Secondary | ICD-10-CM | POA: Diagnosis not present

## 2015-11-06 DIAGNOSIS — H02403 Unspecified ptosis of bilateral eyelids: Secondary | ICD-10-CM | POA: Diagnosis not present

## 2015-11-06 DIAGNOSIS — H02833 Dermatochalasis of right eye, unspecified eyelid: Secondary | ICD-10-CM | POA: Diagnosis not present

## 2015-11-06 DIAGNOSIS — H40013 Open angle with borderline findings, low risk, bilateral: Secondary | ICD-10-CM | POA: Diagnosis not present

## 2015-11-26 ENCOUNTER — Other Ambulatory Visit: Payer: Self-pay | Admitting: *Deleted

## 2015-11-26 ENCOUNTER — Ambulatory Visit (INDEPENDENT_AMBULATORY_CARE_PROVIDER_SITE_OTHER): Payer: Medicare Other | Admitting: Ophthalmology

## 2015-11-26 MED ORDER — FUROSEMIDE 40 MG PO TABS: 40.0000 mg | ORAL_TABLET | Freq: Every day | ORAL | 0 refills | Status: DC

## 2015-12-01 ENCOUNTER — Other Ambulatory Visit (HOSPITAL_COMMUNITY): Payer: Self-pay | Admitting: Family Medicine

## 2015-12-01 ENCOUNTER — Other Ambulatory Visit: Payer: Self-pay | Admitting: Family Medicine

## 2015-12-01 DIAGNOSIS — M79605 Pain in left leg: Secondary | ICD-10-CM

## 2015-12-01 DIAGNOSIS — M4806 Spinal stenosis, lumbar region: Secondary | ICD-10-CM | POA: Diagnosis not present

## 2015-12-01 DIAGNOSIS — M7989 Other specified soft tissue disorders: Principal | ICD-10-CM

## 2015-12-02 ENCOUNTER — Emergency Department (HOSPITAL_COMMUNITY)
Admission: EM | Admit: 2015-12-02 | Discharge: 2015-12-02 | Disposition: A | Discharge status: Home / Self Care | Payer: Medicare Other | Attending: Emergency Medicine | Admitting: Emergency Medicine

## 2015-12-02 ENCOUNTER — Encounter (HOSPITAL_COMMUNITY): Payer: Self-pay

## 2015-12-02 ENCOUNTER — Ambulatory Visit (HOSPITAL_BASED_OUTPATIENT_CLINIC_OR_DEPARTMENT_OTHER)
Admission: RE | Admit: 2015-12-02 | Discharge: 2015-12-02 | Disposition: A | Discharge status: Home / Self Care | Payer: Medicare Other | Source: Ambulatory Visit

## 2015-12-02 DIAGNOSIS — I82402 Acute embolism and thrombosis of unspecified deep veins of left lower extremity: Secondary | ICD-10-CM | POA: Insufficient documentation

## 2015-12-02 DIAGNOSIS — Z7984 Long term (current) use of oral hypoglycemic drugs: Secondary | ICD-10-CM | POA: Insufficient documentation

## 2015-12-02 DIAGNOSIS — Z79899 Other long term (current) drug therapy: Secondary | ICD-10-CM | POA: Insufficient documentation

## 2015-12-02 DIAGNOSIS — M79602 Pain in left arm: Secondary | ICD-10-CM

## 2015-12-02 DIAGNOSIS — I10 Essential (primary) hypertension: Secondary | ICD-10-CM | POA: Diagnosis not present

## 2015-12-02 DIAGNOSIS — E119 Type 2 diabetes mellitus without complications: Secondary | ICD-10-CM | POA: Insufficient documentation

## 2015-12-02 DIAGNOSIS — Z96641 Presence of right artificial hip joint: Secondary | ICD-10-CM | POA: Diagnosis not present

## 2015-12-02 DIAGNOSIS — M79605 Pain in left leg: Secondary | ICD-10-CM | POA: Diagnosis not present

## 2015-12-02 DIAGNOSIS — I824Z2 Acute embolism and thrombosis of unspecified deep veins of left distal lower extremity: Secondary | ICD-10-CM

## 2015-12-02 DIAGNOSIS — I82492 Acute embolism and thrombosis of other specified deep vein of left lower extremity: Secondary | ICD-10-CM

## 2015-12-02 DIAGNOSIS — Z7901 Long term (current) use of anticoagulants: Secondary | ICD-10-CM | POA: Insufficient documentation

## 2015-12-02 DIAGNOSIS — Z96642 Presence of left artificial hip joint: Secondary | ICD-10-CM | POA: Diagnosis not present

## 2015-12-02 DIAGNOSIS — R600 Localized edema: Secondary | ICD-10-CM | POA: Diagnosis present

## 2015-12-02 DIAGNOSIS — M7989 Other specified soft tissue disorders: Principal | ICD-10-CM

## 2015-12-02 NOTE — ED Triage Notes (Signed)
Per Pt, Pt reports having pinched nerve and after exercises at the PT pt noticed swelling in the left leg. CT Scan was positive for DVT. Pt is on Xarelto currently for Afib

## 2015-12-02 NOTE — Progress Notes (Addendum)
**  Preliminary report by tech**  Left lower extremity venous duplex completed. No evidence of superficial thrombosis involving the left lower extremity and right common femoral vein. There is evidence of deep vein thrombosis involving the gastrocnemius veins. All other visualized vessels appear patent and compressible. There is no evidence of a Baker's cyst on the left. Results given to [facility].  12/02/15 9:43 AM [name] RVT

## 2015-12-03 NOTE — ED Provider Notes (Signed)
[facility] DEPT Provider Note   CSN: [id number] Arrival date & time: 12/02/15  1004     History   Chief Complaint Chief Complaint  Patient presents with  . Leg Swelling    HPI [name] is a 70 y.o. female.  78 F with a cc of LLE leg swelling.  Going on for past couple of days.  The patient was seen by her PCP and had a DVT study ordered. It was positive for a calf DVT. Patient has a history of multiple DVTs in the past is currently on Xarelto for atrial fibrillation. She denies chest pain or shortness of breath. Denies syncope. Denies exertional symptoms.   The history is provided by the patient and a friend.    Past Medical History:  Diagnosis Date  . Actinic keratosis   . Anemia    hx of  . Anemia of chronic renal failure, stage 3 (moderate) 07/17/2015  . Anticoagulant causing adverse effect in therapeutic use 07/17/2015  . Anxiety   . Atrial fibrillation ([place])   . Atrial fibrillation ([place])   . Carpal tunnel syndrome   . Chest pain   . Depression   . Edema   . Gastroesophageal reflux disease   . Hepatitis 1970  . Hiatal hernia   . Hyperlipidemia   . Hypertension   . Iron deficiency anemia due to chronic blood loss 07/17/2015  . Jaundice    age 70  . Memory loss   . Rheumatoid arthritis(714.0)   . Thyroid disease    multi nodular goiter  . Type II diabetes mellitus [facility])     Patient Active Problem List   Diagnosis Date Noted  . Varicose veins of left leg with edema 09/15/2015  . Preop cardiovascular exam 08/25/2015  . Iron deficiency anemia due to chronic blood loss 07/17/2015  . Anemia of chronic renal failure, stage 3 (moderate) 07/17/2015  . Anticoagulant causing adverse effect in therapeutic use 07/17/2015  . Symptomatic bradycardia 02/21/2015  . Bradycardia   . A-fib ([place]) 10/09/2014  . Deep vein thrombosis, lower left extremity ([place]) 05/06/2014  . Abdominal wall mass of right lower quadrant 07/08/2013  . OA (osteoarthritis) of hip  04/03/2013  . Osteoarthritis of hip 01/18/2013  . Night sweats 09/06/2012  . Chest pain   . Atrial fibrillation with slow ventricular response ([place])   . Anemia   . Anxiety and depression   . Gastroesophageal reflux disease   . Hepatitis   . Jaundice   . Hypertension 07/01/2010  . Hypercholesterolemia 07/01/2010  . DM2 (diabetes mellitus, type 2) ([place]) 07/01/2010  . Rheumatoid arthritis ([place]) 07/01/2010  . Sinus tachycardia ([place]) 07/01/2010  . Chest pain, atypical 07/01/2010    Past Surgical History:  Procedure Laterality Date  . CATARACT EXTRACTION Left   . COLONOSCOPY WITH PROPOFOL N/A 02/20/2015   Procedure: COLONOSCOPY WITH PROPOFOL;  Surgeon: Carol Ada, MD;  Location: [facility] ENDOSCOPY;  Service: Endoscopy;  Laterality: N/A;  . ESOPHAGOGASTRODUODENOSCOPY (EGD) WITH PROPOFOL N/A 02/20/2015   Procedure: ESOPHAGOGASTRODUODENOSCOPY (EGD) WITH PROPOFOL;  Surgeon: Carol Ada, MD;  Location: [facility] ENDOSCOPY;  Service: Endoscopy;  Laterality: N/A;  . HAND SURGERY  1995 and 1996   artificial joints both hands  . JOINT REPLACEMENT    . TOTAL HIP ARTHROPLASTY Left 2009  . TOTAL HIP ARTHROPLASTY Right 04/03/2013   Procedure: RIGHT TOTAL HIP ARTHROPLASTY ANTERIOR APPROACH;  Surgeon: Gearlean Alf, MD;  Location: [facility] ORS;  Service: Orthopedics;  Laterality: Right;    OB History  No data available       Home Medications    Prior to Admission medications   Medication Sig Start Date End Date Taking? Authorizing Provider  buPROPion (WELLBUTRIN SR) 150 MG 12 hr tablet Take 150 mg by mouth 2 (two) times daily.      Historical Provider, MD  cephALEXin (KEFLEX) 500 MG capsule Take 1 capsule (500 mg total) by mouth 4 (four) times daily. Patient not taking: Reported on 09/15/2015 08/04/15   Marella Chimes, PA-C  Cholecalciferol (VITAMIN D3) 2000 UNITS TABS Take 2,000 Units by mouth daily.     Historical Provider, MD  Cyanocobalamin (VITAMIN B 12 PO) Take 1,000 mcg by mouth daily.      Historical Provider, MD  diltiazem (CARDIZEM CD) 240 MG 24 hr capsule Take 240 mg by mouth daily.    Historical Provider, MD  esomeprazole (NEXIUM) 40 MG capsule Take 40 mg by mouth daily.      Historical Provider, MD  fluticasone (FLONASE) 50 MCG/ACT nasal spray Place 1-2 sprays into both nostrils daily as needed for allergies.  04/12/15   Historical Provider, MD  furosemide (LASIX) 40 MG tablet Take 1 tablet (40 mg total) by mouth daily. 11/26/15   Lelon Perla, MD  gabapentin (NEURONTIN) 300 MG capsule Take 300 mg by mouth 2 (two) times daily.    Historical Provider, MD  glimepiride (AMARYL) 2 MG tablet Take 2 mg by mouth every morning. 11/03/14   Historical Provider, MD  HYDROcodone-acetaminophen (NORCO/VICODIN) 5-325 MG tablet Take 1 tablet by mouth every 4 (four) hours as needed for moderate pain or severe pain. Reported on 09/15/2015 01/25/15   Historical Provider, MD  KLOR-CON 10 10 MEQ tablet TAKE 2 TABLETS BY MOUTH TWICE A DAY 01/27/15   Darlin Coco, MD  leflunomide (ARAVA) 20 MG tablet Take 20 mg by mouth daily.  04/26/13   Historical Provider, MD  metFORMIN (GLUCOPHAGE-XR) 500 MG 24 hr tablet Take 1,000 mg by mouth 2 (two) times daily. 09/02/14   Historical Provider, MD  metoprolol (LOPRESSOR) 50 MG tablet Take 1.5 tablets (75 mg total) by mouth 2 (two) times daily. 10/09/15   Lelon Perla, MD  Multiple Vitamins-Minerals (PRESERVISION AREDS) CAPS Take 1 capsule by mouth 2 (two) times daily.    Historical Provider, MD  nitroGLYCERIN (NITROSTAT) 0.4 MG SL tablet Place 0.4 mg under the tongue every 5 (five) minutes as needed for chest pain (x 3 doses).     Historical Provider, MD  Omega 3 1200 MG CAPS Take 1 capsule by mouth daily.     Historical Provider, MD  ORENCIA 125 MG/ML SOSY Inject 125 mg into the skin every 7 (seven) days. Either Wed or Thurs. 01/27/15   Historical Provider, MD  predniSONE (DELTASONE) 5 MG tablet Take 5 mg by mouth daily.    Historical Provider, MD  rivaroxaban  (XARELTO) 20 MG TABS tablet Take 1 tablet (20 mg total) by mouth daily with supper. 07/08/14   Truitt Merle, MD    Family History Family History  Problem Relation Age of Onset  . Stroke Father   . Heart failure Sister   . Hypertension Sister   . Heart attack Neg Hx     Social History Social History  Substance Use Topics  . Smoking status: Never Smoker  . Smokeless tobacco: Never Used  . Alcohol use No     Allergies   Review of patient's allergies indicates no known allergies.   Review of Systems Review of Systems  Constitutional: Negative for chills and fever.  HENT: Negative for congestion and rhinorrhea.   Eyes: Negative for redness and visual disturbance.  Respiratory: Negative for shortness of breath and wheezing.   Cardiovascular: Positive for leg swelling. Negative for chest pain and palpitations.  Gastrointestinal: Negative for nausea and vomiting.  Genitourinary: Negative for dysuria and urgency.  Musculoskeletal: Negative for arthralgias and myalgias.  Skin: Negative for pallor and wound.  Neurological: Negative for dizziness and headaches.     Physical Exam Updated Vital Signs BP 134/92 (BP Location: Right Arm)   Pulse 76   Temp 98.5 F (36.9 C) (Oral)   Resp 16   Ht 5\' 7"  (1.702 m)   Wt 178 lb (80.7 kg)   SpO2 98%   BMI 27.88 kg/m   Physical Exam  Constitutional: She is oriented to person, place, and time. She appears well-developed and well-nourished. No distress.  HENT:  Head: Normocephalic and atraumatic.  Eyes: EOM are normal. Pupils are equal, round, and reactive to light.  Neck: Normal range of motion. Neck supple.  Cardiovascular: Normal rate and regular rhythm.  Exam reveals no gallop and no friction rub.   No murmur heard. Pulmonary/Chest: Effort normal. She has no wheezes. She has no rales.  Abdominal: Soft. She exhibits no distension. There is no tenderness.  Musculoskeletal: She exhibits edema (Left sided 2+ compared to trace on the  Right). She exhibits no tenderness.  Neurological: She is alert and oriented to person, place, and time.  Skin: Skin is warm and dry. She is not diaphoretic.  Psychiatric: She has a normal mood and affect. Her behavior is normal.  Nursing note and vitals reviewed.    ED Treatments / Results  Labs (all labs ordered are listed, but only abnormal results are displayed) Labs Reviewed - No data to display  EKG  EKG Interpretation None       Radiology No results found.  Procedures Procedures (including critical care time)  Medications Ordered in ED Medications - No data to display   Initial Impression / Assessment and Plan / ED Course  I have reviewed the triage vital signs and the nursing notes.  Pertinent labs & imaging results that were available during my care of the patient were reviewed by me and considered in my medical decision making (see chart for details).  Clinical Course    70 yo F With a chief complaint of left leg swelling. Found to have a gastrocnemius DVT. Patient is already on Xarelto. We'll have the patient follow-up with her hematologist and family physician.  9:19 AM:  I have discussed the diagnosis/risks/treatment options with the patient and caregiver and believe the pt to be eligible for discharge home to follow-up with PCP, heme. We also discussed returning to the ED immediately if new or worsening sx occur. We discussed the sx which are most concerning (e.g., chest pain, sob, syncop) that necessitate immediate return. Medications administered to the patient during their visit and any new prescriptions provided to the patient are listed below.  Medications given during this visit Medications - No data to display   The patient appears reasonably screen and/or stabilized for discharge and I doubt any other medical condition or other [facility] requiring further screening, evaluation, or treatment in the ED at this time prior to discharge.    Final Clinical  Impressions(s) / ED Diagnoses   Final diagnoses:  DVT, lower extremity, distal, acute, left [facility])    New Prescriptions Discharge Medication List as of 12/02/2015  [place], DO 12/03/15 [id number]

## 2015-12-09 DIAGNOSIS — I824Y2 Acute embolism and thrombosis of unspecified deep veins of left proximal lower extremity: Secondary | ICD-10-CM | POA: Diagnosis not present

## 2015-12-09 DIAGNOSIS — E1122 Type 2 diabetes mellitus with diabetic chronic kidney disease: Secondary | ICD-10-CM | POA: Diagnosis not present

## 2015-12-09 DIAGNOSIS — N189 Chronic kidney disease, unspecified: Secondary | ICD-10-CM | POA: Diagnosis not present

## 2015-12-09 DIAGNOSIS — I4891 Unspecified atrial fibrillation: Secondary | ICD-10-CM | POA: Diagnosis not present

## 2015-12-09 DIAGNOSIS — M069 Rheumatoid arthritis, unspecified: Secondary | ICD-10-CM | POA: Diagnosis not present

## 2015-12-22 ENCOUNTER — Other Ambulatory Visit: Payer: Self-pay | Admitting: Vascular Surgery

## 2015-12-22 DIAGNOSIS — Z86718 Personal history of other venous thrombosis and embolism: Secondary | ICD-10-CM

## 2015-12-23 ENCOUNTER — Other Ambulatory Visit: Payer: Self-pay | Admitting: Cardiology

## 2015-12-23 DIAGNOSIS — I82411 Acute embolism and thrombosis of right femoral vein: Secondary | ICD-10-CM | POA: Diagnosis not present

## 2015-12-28 DIAGNOSIS — E119 Type 2 diabetes mellitus without complications: Secondary | ICD-10-CM | POA: Diagnosis not present

## 2015-12-28 DIAGNOSIS — D649 Anemia, unspecified: Secondary | ICD-10-CM | POA: Diagnosis not present

## 2015-12-28 DIAGNOSIS — E7800 Pure hypercholesterolemia, unspecified: Secondary | ICD-10-CM | POA: Diagnosis not present

## 2015-12-28 DIAGNOSIS — I10 Essential (primary) hypertension: Secondary | ICD-10-CM | POA: Diagnosis not present

## 2015-12-30 ENCOUNTER — Telehealth: Payer: Self-pay | Admitting: Hematology

## 2015-12-30 NOTE — Telephone Encounter (Signed)
10/4 appointment canceled per patient request. Patient will call to reschedule.

## 2016-01-04 ENCOUNTER — Other Ambulatory Visit: Payer: Self-pay | Admitting: Cardiology

## 2016-01-04 MED ORDER — POTASSIUM CHLORIDE ER 10 MEQ PO TBCR: 20.0000 meq | EXTENDED_RELEASE_TABLET | Freq: Two times a day (BID) | ORAL | 10 refills | Status: DC

## 2016-01-06 ENCOUNTER — Ambulatory Visit: Payer: Medicare Other | Admitting: Hematology

## 2016-01-08 ENCOUNTER — Encounter: Payer: Self-pay | Admitting: Vascular Surgery

## 2016-01-12 DIAGNOSIS — M0589 Other rheumatoid arthritis with rheumatoid factor of multiple sites: Secondary | ICD-10-CM | POA: Diagnosis not present

## 2016-01-12 DIAGNOSIS — M150 Primary generalized (osteo)arthritis: Secondary | ICD-10-CM | POA: Diagnosis not present

## 2016-01-12 DIAGNOSIS — Z7952 Long term (current) use of systemic steroids: Secondary | ICD-10-CM | POA: Diagnosis not present

## 2016-01-12 DIAGNOSIS — M25512 Pain in left shoulder: Secondary | ICD-10-CM | POA: Diagnosis not present

## 2016-01-12 DIAGNOSIS — D500 Iron deficiency anemia secondary to blood loss (chronic): Secondary | ICD-10-CM | POA: Diagnosis not present

## 2016-01-12 DIAGNOSIS — M2550 Pain in unspecified joint: Secondary | ICD-10-CM | POA: Diagnosis not present

## 2016-01-12 DIAGNOSIS — Z79899 Other long term (current) drug therapy: Secondary | ICD-10-CM | POA: Diagnosis not present

## 2016-01-12 DIAGNOSIS — M5030 Other cervical disc degeneration, unspecified cervical region: Secondary | ICD-10-CM | POA: Diagnosis not present

## 2016-01-13 ENCOUNTER — Encounter: Payer: Self-pay | Admitting: Vascular Surgery

## 2016-01-13 ENCOUNTER — Ambulatory Visit (HOSPITAL_COMMUNITY)
Admission: RE | Admit: 2016-01-13 | Discharge: 2016-01-13 | Disposition: A | Discharge status: Home / Self Care | Payer: Medicare Other | Source: Ambulatory Visit | Attending: Vascular Surgery | Admitting: Vascular Surgery

## 2016-01-13 ENCOUNTER — Ambulatory Visit (INDEPENDENT_AMBULATORY_CARE_PROVIDER_SITE_OTHER): Payer: Medicare Other | Admitting: Vascular Surgery

## 2016-01-13 VITALS — BP 121/82 | HR 117 | Temp 97.1°F | Resp 24 | Ht 66.5 in | Wt 182.0 lb

## 2016-01-13 DIAGNOSIS — I82412 Acute embolism and thrombosis of left femoral vein: Secondary | ICD-10-CM | POA: Diagnosis not present

## 2016-01-13 DIAGNOSIS — M7989 Other specified soft tissue disorders: Secondary | ICD-10-CM | POA: Insufficient documentation

## 2016-01-13 DIAGNOSIS — I8392 Asymptomatic varicose veins of left lower extremity: Secondary | ICD-10-CM | POA: Diagnosis not present

## 2016-01-13 DIAGNOSIS — Z86718 Personal history of other venous thrombosis and embolism: Secondary | ICD-10-CM | POA: Diagnosis not present

## 2016-01-13 DIAGNOSIS — I82402 Acute embolism and thrombosis of unspecified deep veins of left lower extremity: Secondary | ICD-10-CM | POA: Diagnosis not present

## 2016-01-13 NOTE — Progress Notes (Signed)
Patient ID: [name], female   DOB: 04-13-1945, 70 y.o.   MRN: [id number]  Reason for Consult: New Evaluation (referred by Bing Matter for Left LE DVT)   Referred by Aletha Halim., PA-C  Subjective:     HPI:  [name] is a 70 y.o. female who has been seen in our office a few months back for left lower extremity edema.she states that she first noticed the edema 7 months ago while undergoing workup for back surgery. She does have a history of a DVT treated with broad access and is now on Xarelto for atrial fibrillation. She does not complain of pain in the left lower extremity but rather has pain in her back radiating down her bilateral lower extremities that is relieved with sitting or bending over. The back pain has limited her ambulation and she does have associated weight gain. Swelling does improve with elevation of the leg and with compression stockings which she has been wearing quite religiously. There are no other complaints associated with today's visit.  Past Medical History:  Diagnosis Date  . Actinic keratosis   . Anemia    hx of  . Anemia of chronic renal failure, stage 3 (moderate) 07/17/2015  . Anticoagulant causing adverse effect in therapeutic use 07/17/2015  . Anxiety   . Atrial fibrillation ([place])   . Atrial fibrillation ([place])   . Carpal tunnel syndrome   . Chest pain   . Depression   . Edema   . Gastroesophageal reflux disease   . Hepatitis 1970  . Hiatal hernia   . Hyperlipidemia   . Hypertension   . Iron deficiency anemia due to chronic blood loss 07/17/2015  . Jaundice    age 2  . Memory loss   . Rheumatoid arthritis(714.0)   . Thyroid disease    multi nodular goiter  . Type II diabetes mellitus (H[facility])    Family History  Problem Relation Age of Onset  . Stroke Father   . Heart failure Sister   . Hypertension Sister   . Heart attack Neg Hx    Past Surgical History:  Procedure Laterality Date  . CATARACT EXTRACTION Left   .  COLONOSCOPY WITH PROPOFOL N/A 02/20/2015   Procedure: COLONOSCOPY WITH PROPOFOL;  Surgeon: Carol Ada, MD;  Location: [facility] ENDOSCOPY;  Service: Endoscopy;  Laterality: N/A;  . ESOPHAGOGASTRODUODENOSCOPY (EGD) WITH PROPOFOL N/A 02/20/2015   Procedure: ESOPHAGOGASTRODUODENOSCOPY (EGD) WITH PROPOFOL;  Surgeon: Carol Ada, MD;  Location: [facility];  Service: Endoscopy;  Laterality: N/A;  . HAND SURGERY  1995 and 1996   artificial joints both hands  . JOINT REPLACEMENT    . TOTAL HIP ARTHROPLASTY Left 2009  . TOTAL HIP ARTHROPLASTY Right 04/03/2013   Procedure: RIGHT TOTAL HIP ARTHROPLASTY ANTERIOR APPROACH;  Surgeon: Gearlean Alf, MD;  Location: [facility] ORS;  Service: Orthopedics;  Laterality: Right;    Short Social History:  Social History  Substance Use Topics  . Smoking status: Never Smoker  . Smokeless tobacco: Never Used  . Alcohol use No    No Known Allergies  Current Outpatient Prescriptions  Medication Sig Dispense Refill  . buPROPion (WELLBUTRIN SR) 150 MG 12 hr tablet Take 150 mg by mouth 2 (two) times daily.      . Cholecalciferol (VITAMIN D3) 2000 UNITS TABS Take 2,000 Units by mouth daily.     . Cyanocobalamin (VITAMIN B 12 PO) Take 1,000 mcg by mouth daily.     [name] diltiazem (CARDIZEM CD) 240  MG 24 hr capsule Take 240 mg by mouth daily.    [name] esomeprazole (NEXIUM) 40 MG capsule Take 40 mg by mouth daily.      . fluticasone (FLONASE) 50 MCG/ACT nasal spray Place 1-2 sprays into both nostrils daily as needed for allergies.     . furosemide (LASIX) 40 MG tablet TAKE 1 TABLET (40 MG TOTAL) BY MOUTH DAILY. 30 tablet 2  . gabapentin (NEURONTIN) 300 MG capsule Take 300 mg by mouth daily.     [name] glimepiride (AMARYL) 2 MG tablet Take 2 mg by mouth every morning.  3  . HYDROcodone-acetaminophen (NORCO/VICODIN) 5-325 MG tablet Take 1 tablet by mouth every 4 (four) hours as needed for moderate pain or severe pain. Reported on 09/15/2015  0  . leflunomide (ARAVA) 20 MG tablet Take 20 mg  by mouth daily.     . metFORMIN (GLUCOPHAGE-XR) 500 MG 24 hr tablet Take 1,000 mg by mouth 2 (two) times daily.  1  . metoprolol (LOPRESSOR) 50 MG tablet Take 1.5 tablets (75 mg total) by mouth 2 (two) times daily. 270 tablet 1  . Multiple Vitamins-Minerals (PRESERVISION AREDS) CAPS Take 1 capsule by mouth 2 (two) times daily.    . nitroGLYCERIN (NITROSTAT) 0.4 MG SL tablet Place 0.4 mg under the tongue every 5 (five) minutes as needed for chest pain (x 3 doses).     . Omega 3 1200 MG CAPS Take 1 capsule by mouth daily.     [name] ORENCIA 125 MG/ML SOSY Inject 125 mg into the skin every 7 (seven) days. Either Wed or Thurs.    . potassium chloride (KLOR-CON 10) 10 MEQ tablet Take 2 tablets (20 mEq total) by mouth 2 (two) times daily. 120 tablet 10  . predniSONE (DELTASONE) 5 MG tablet Take 5 mg by mouth daily.    . rivaroxaban (XARELTO) 20 MG TABS tablet Take 1 tablet (20 mg total) by mouth daily with supper. 30 tablet 3  . cephALEXin (KEFLEX) 500 MG capsule Take 1 capsule (500 mg total) by mouth 4 (four) times daily. (Patient not taking: Reported on 01/13/2016) 40 capsule 0   No current facility-administered medications for this visit.     Review of Systems  Constitutional: Positive for unexpected weight change.  HENT: HENT negative.  Respiratory: Respiratory negative.  Cardiovascular: Positive for leg swelling.  GI: Gastrointestinal negative.  Musculoskeletal: Positive for back pain and leg pain.  Skin: Skin negative.  Neurological: Neurological negative. Hematologic: Hematologic/lymphatic negative.  Psychiatric: Psychiatric negative.        Objective:  Objective   Vitals:   01/13/16 1231 01/13/16 1239  BP: (!) 143/72 121/82  Pulse: (!) 120 (!) 117  Resp: (!) 24   Temp: 97.1 F (36.2 C)   TempSrc: Oral   SpO2: 95%   Weight: 182 lb (82.6 kg)   Height: 5' 6.5" (1.689 m)    Body mass index is 28.94 kg/m.  Physical Exam  Constitutional: She is oriented to person, place, and  time. She appears well-developed.  HENT:  Head: Atraumatic.  Eyes: EOM are normal.  Neck: Normal range of motion.  Cardiovascular: An irregularly irregular rhythm present.  Pulses:      Femoral pulses are 2+ on the right side, and 2+ on the left side.      Popliteal pulses are 2+ on the right side, and 2+ on the left side.       Dorsalis pedis pulses are 2+ on the right side, and 2+ on the  left side.       Posterior tibial pulses are 2+ on the right side, and 2+ on the left side.  Pulmonary/Chest: Effort normal.  Abdominal: Soft. She exhibits no mass.  Musculoskeletal:  Left leg has non-pitting edema below the knee, above the knee normal compared to right  Neurological: She is alert and oriented to person, place, and time.  Skin: Skin is warm and dry.  Psychiatric: She has a normal mood and affect. Her behavior is normal. Judgment and thought content normal.    Data: No evidence of acute DVT left lower extremity. There is no reflux noted in the left lower extremity either. There is evidence of partially occlusive thrombus in the left proximal profunda vein consistent with previous exam.     Assessment/Plan:     70 year old white female returns to the office for evaluation of left lower extremity edema. This has been stable and she does have history of DVT which is chronic appearing now. She is on Xarelto for atrial fibrillation. I recommended continued ambulation with compression and elevation as tolerated. There does not appear to be any contraindications to procedures from a swelling standpoint and no indication for intervention at this time. She will continue Xarelto and follow-up on a when necessary basis.     Waynetta Sandy MD [facility]

## 2016-01-13 NOTE — Progress Notes (Signed)
Vitals:   01/13/16 1231 01/13/16 1239  BP: (!) 143/72 121/82  Pulse: (!) 120 (!) 117  Resp: (!) 24   Temp: 97.1 F (36.2 C)   TempSrc: Oral   SpO2: 95%   Weight: 182 lb (82.6 kg)   Height: 5' 6.5" (1.689 m)

## 2016-01-15 DIAGNOSIS — M48061 Spinal stenosis, lumbar region without neurogenic claudication: Secondary | ICD-10-CM | POA: Diagnosis not present

## 2016-01-15 DIAGNOSIS — M5136 Other intervertebral disc degeneration, lumbar region: Secondary | ICD-10-CM | POA: Diagnosis not present

## 2016-01-26 NOTE — Progress Notes (Signed)
Surgery on 02/10/2016.  preop on 02/03/2016.  Need orders in EPIC .  Thank You.

## 2016-01-27 ENCOUNTER — Ambulatory Visit: Payer: Self-pay | Admitting: Orthopedic Surgery

## 2016-02-01 ENCOUNTER — Ambulatory Visit: Payer: Self-pay | Admitting: Orthopedic Surgery

## 2016-02-01 NOTE — Patient Instructions (Addendum)
[name]  02/01/2016   Your procedure is scheduled on: 02/10/16  Report to [facility] Main  Entrance take [facility]  elevators to 3rd floor to  [place] at  0900 AM.  Call this number if you have problems the morning of surgery [phone]   Remember: ONLY 1 PERSON MAY GO WITH YOU TO SHORT STAY TO GET  READY MORNING OF [place].  Do not eat food or drink liquids :After Midnight.     Take these medicines the morning of surgery with A SIP OF WATER: METOPROLOL, Wellbutrin, Diltiazem, Nexium, Gabapentin, Prednisone May take Hydrocodone, Nitroglycerin, or use Flonas nasal spray if needed DO NOT TAKE ANY DIABETIC MEDICATIONS DAY OF YOUR SURGERY                               You may not have any metal on your body including hair pins and              piercings  Do not wear jewelry, make-up, lotions, powders or perfumes, deodorant             Do not wear nail polish.  Do not shave  48 hours prior to surgery.              Men may shave face and neck.   Do not bring valuables to the hospital. [place].  Contacts, dentures or bridgework may not be worn into surgery.  Leave suitcase in the car. After surgery it may be brought to your room.     Patients discharged the day of surgery will not be allowed to drive home.  Name and phone number of your driver:  Special Instructions: N/A              Please read over the following fact sheets you were given: _____________________________________________________________________   How to Manage Your Diabetes Before and After Surgery  Why is it important to control my blood sugar before and after surgery? . Improving blood sugar levels before and after surgery helps healing and can limit problems. . A way of improving blood sugar control is eating a healthy diet by: o  Eating less sugar and carbohydrates o  Increasing activity/exercise o  Talking with your  doctor about reaching your blood sugar goals . High blood sugars (greater than 180 mg/dL) can raise your risk of infections and slow your recovery, so you will need to focus on controlling your diabetes during the weeks before surgery. . Make sure that the doctor who takes care of your diabetes knows about your planned surgery including the date and location.  How do I manage my blood sugar before surgery? . Check your blood sugar at least 4 times a day, starting 2 days before surgery, to make sure that the level is not too high or low. o Check your blood sugar the morning of your surgery when you wake up and every 2 hours until you get to the Short Stay unit. . If your blood sugar is less than 70 mg/dL, you will need to treat for low blood sugar: o Do not take insulin. o Treat a low blood sugar (less than 70 mg/dL) with  cup of clear juice (cranberry  or apple), 4 glucose tablets, OR glucose gel. o Recheck blood sugar in 15 minutes after treatment (to make sure it is greater than 70 mg/dL). If your blood sugar is not greater than 70 mg/dL on recheck, call [phone] for further instructions. . Report your blood sugar to the short stay nurse when you get to Short Stay.  . If you are admitted to the hospital after surgery: o Your blood sugar will be checked by the staff and you will probably be given insulin after surgery (instead of oral diabetes medicines) to make sure you have good blood sugar levels. o The goal for blood sugar control after surgery is 80-180 mg/dL.   WHAT DO I DO ABOUT MY DIABETES MEDICATION?  [name] Do not take oral diabetes medicines (pills) the morning of surgery.  .   .   .   Patient Signature:  Date:   Nurse Signature:  Date:   Reviewed and Endorsed by [place] Patient Education Committee, August 2015          [facility] - Preparing for Surgery Before surgery, you can play an important role.  Because skin is not sterile, your skin needs to be as free of  germs as possible.  You can reduce the number of germs on your skin by washing with CHG (chlorahexidine gluconate) soap before surgery.  CHG is an antiseptic cleaner which kills germs and bonds with the skin to continue killing germs even after washing. Please DO NOT use if you have an allergy to CHG or antibacterial soaps.  If your skin becomes reddened/irritated stop using the CHG and inform your nurse when you arrive at Short Stay. Do not shave (including legs and underarms) for at least 48 hours prior to the first CHG shower.  You may shave your face/neck. Please follow these instructions carefully:  1.  Shower with CHG Soap the night before surgery and the  morning of Surgery.  2.  If you choose to wash your hair, wash your hair first as usual with your  normal  shampoo.  3.  After you shampoo, rinse your hair and body thoroughly to remove the  shampoo.                           4.  Use CHG as you would any other liquid soap.  You can apply chg directly  to the skin and wash                       Gently with a scrungie or clean washcloth.  5.  Apply the CHG Soap to your body ONLY FROM THE NECK DOWN.   Do not use on face/ open                           Wound or open sores. Avoid contact with eyes, ears mouth and genitals (private parts).                       Wash face,  Genitals (private parts) with your normal soap.             6.  Wash thoroughly, paying special attention to the area where your surgery  will be performed.  7.  Thoroughly rinse your body with warm water from the neck down.  8.  DO NOT shower/wash with your normal soap after using  and rinsing off  the CHG Soap.                9.  Pat yourself dry with a clean towel.            10.  Wear clean pajamas.            11.  Place clean sheets on your bed the night of your first shower and do not  sleep with pets. Day of Surgery : Do not apply any lotions/deodorants the morning of surgery.  Please wear clean clothes to the  hospital/surgery center.  FAILURE TO FOLLOW THESE INSTRUCTIONS MAY RESULT IN THE CANCELLATION OF YOUR SURGERY ________________________________________________________________________   Incentive Spirometer  An incentive spirometer is a tool that can help keep your lungs clear and active. This tool measures how well you are filling your lungs with each breath. Taking long deep breaths may help reverse or decrease the chance of developing breathing (pulmonary) problems (especially infection) following:  A long period of time when you are unable to move or be active. BEFORE THE PROCEDURE   If the spirometer includes an indicator to show your best effort, your nurse or respiratory therapist will set it to a desired goal.  If possible, sit up straight or lean slightly forward. Try not to slouch.  Hold the incentive spirometer in an upright position. INSTRUCTIONS FOR USE  1. Sit on the edge of your bed if possible, or sit up as far as you can in bed or on a chair. 2. Hold the incentive spirometer in an upright position. 3. Breathe out normally. 4. Place the mouthpiece in your mouth and seal your lips tightly around it. 5. Breathe in slowly and as deeply as possible, raising the piston or the ball toward the top of the column. 6. Hold your breath for 3-5 seconds or for as long as possible. Allow the piston or ball to fall to the bottom of the column. 7. Remove the mouthpiece from your mouth and breathe out normally. 8. Rest for a few seconds and repeat Steps 1 through 7 at least 10 times every 1-2 hours when you are awake. Take your time and take a few normal breaths between deep breaths. 9. The spirometer may include an indicator to show your best effort. Use the indicator as a goal to work toward during each repetition. 10. After each set of 10 deep breaths, practice coughing to be sure your lungs are clear. If you have an incision (the cut made at the time of surgery), support your incision  when coughing by placing a pillow or rolled up towels firmly against it. Once you are able to get out of bed, walk around indoors and cough well. You may stop using the incentive spirometer when instructed by your caregiver.  RISKS AND COMPLICATIONS  Take your time so you do not get dizzy or light-headed.  If you are in pain, you may need to take or ask for pain medication before doing incentive spirometry. It is harder to take a deep breath if you are having pain. AFTER USE  Rest and breathe slowly and easily.  It can be helpful to keep track of a log of your progress. Your caregiver can provide you with a simple table to help with this. If you are using the spirometer at home, follow these instructions: [place] IF:   You are having difficultly using the spirometer.  You have trouble using the spirometer as often as  instructed.  Your pain medication is not giving enough relief while using the spirometer.  You develop fever of 100.5 F (38.1 C) or higher. SEEK IMMEDIATE MEDICAL CARE IF:   You cough up bloody sputum that had not been present before.  You develop fever of 102 F (38.9 C) or greater.  You develop worsening pain at or near the incision site. MAKE SURE YOU:   Understand these instructions.  Will watch your condition.  Will get help right away if you are not doing well or get worse. Document Released: 08/01/2006 Document Revised: 06/13/2011 Document Reviewed: 10/02/2006 [facility] Patient Information 2014 [place], [place].   ________________________________________________________________________

## 2016-02-01 NOTE — H&P (Signed)
[name] is an 70 y.o. female.   Chief Complaint: back and B/L leg pain HPI: The patient is a 70 year old female who presents today for follow up of their back. The patient is being followed for their back pain. They are now year(s) out from when symptoms began. Symptoms reported today include: pain (low back) and leg pain (bilateral). The patient states that they are doing poorly. Current treatment includes: Gabapentin. The patient reports their current pain level to be 8 / 10. The patient presents today following a visit with Dr. Cyndy Freeze.  Past Medical History:  Diagnosis Date  . Actinic keratosis   . Anemia    hx of  . Anemia of chronic renal failure, stage 3 (moderate) 07/17/2015  . Anticoagulant causing adverse effect in therapeutic use 07/17/2015  . Anxiety   . Atrial fibrillation ([place])   . Atrial fibrillation ([place])   . Carpal tunnel syndrome   . Chest pain   . Depression   . Edema   . Gastroesophageal reflux disease   . Hepatitis 1970  . Hiatal hernia   . Hyperlipidemia   . Hypertension   . Iron deficiency anemia due to chronic blood loss 07/17/2015  . Jaundice    age 7  . Memory loss   . Rheumatoid arthritis(714.0)   . Thyroid disease    multi nodular goiter  . Type II diabetes mellitus ([place])     Past Surgical History:  Procedure Laterality Date  . CATARACT EXTRACTION Left   . COLONOSCOPY WITH PROPOFOL N/A 02/20/2015   Procedure: COLONOSCOPY WITH PROPOFOL;  Surgeon: Carol Ada, MD;  Location: [facility] ENDOSCOPY;  Service: Endoscopy;  Laterality: N/A;  . ESOPHAGOGASTRODUODENOSCOPY (EGD) WITH PROPOFOL N/A 02/20/2015   Procedure: ESOPHAGOGASTRODUODENOSCOPY (EGD) WITH PROPOFOL;  Surgeon: Carol Ada, MD;  Location: [facility] END[facility];  Service: Endoscopy;  Laterality: N/A;  . HAND SURGERY  1995 and 1996   artificial joints both hands  . JOINT REPLACEMENT    . TOTAL HIP ARTHROPLASTY Left 2009  . TOTAL HIP ARTHROPLASTY Right 04/03/2013   Procedure: RIGHT TOTAL HIP ARTHROPLASTY  ANTERIOR APPROACH;  Surgeon: Gearlean Alf, MD;  Location: [facility] ORS;  Service: Orthopedics;  Laterality: Right;    Family History  Problem Relation Age of Onset  . Stroke Father   . Heart failure Sister   . Hypertension Sister   . Heart attack Neg Hx    Social History:  reports that she has never smoked. She has never used smokeless tobacco. She reports that she does not drink alcohol or use drugs.  Allergies: No Known Allergies   (Not in a hospital admission)  No results found for this or any previous visit (from the past 48 hour(s)). No results found.  Review of Systems  Constitutional: Negative.   HENT: Negative.   Eyes: Negative.   Respiratory: Negative.   Cardiovascular: Negative.   Gastrointestinal: Negative.   Genitourinary: Negative.   Musculoskeletal: Positive for back pain.  Skin: Negative.   Neurological: Positive for sensory change and focal weakness.  Psychiatric/Behavioral: Negative.     There were no vitals taken for this visit. Physical Exam  Constitutional: She appears well-developed and well-nourished. She appears distressed.  HENT:  Head: Normocephalic.  Eyes: Pupils are equal, round, and reactive to light.  Neck: Normal range of motion.  Cardiovascular: Normal rate.   Respiratory: Effort normal.  GI: Soft.  Musculoskeletal:  On exam, she is in moderate distress, walks with a forward flexed antalgic gait. Straight leg  raises buttock and thigh pain. She has slight quadriceps weakness bilaterally. Lumbar spine exam reveals no evidence of soft tissue swelling, deformity or skin ecchymosis. On palpation there is no tenderness of the lumbar spine. No flank pain with percussion. The abdomen is soft and nontender. Nontender over the trochanters. No cellulitis or lymphadenopathy.  Good range of motion of the lumbar spine without associated pain. Motor is 5/5 including EHL, tibialis anterior, plantar flexion, quadriceps and hamstrings. Patient is  normoreflexic. There is no Babinski or clonus. Sensory exam is intact to light touch. Patient has good distal pulses. No DVT. No pain and normal range of motion without instability of the hips, knees and ankles.  Neurological: She is alert.    MRI demonstrate severe multifactorial stenosis at 4-5, end-stage disc degeneration, minimal listhesis, no instability in flexion extension  Assessment/Plan Severe neurogenic claudication secondary to severe spinal stenosis at 4-5 extending into 3-4.  Discussed living with her symptoms. She no longer like to do so, significantly disabled by her symptoms and her options are decompression at 4-5 extending into 3-4. I had an extensive discussion of the risks and benefits of the lumbar decompression with the patient including bleeding, infection, damage to neurovascular structures, epidural fibrosis, CSF leak requiring repair. We also discussed increase in pain, adjacent segment disease, recurrent disc herniation, need for future surgery including repeat decompression and/or fusion. We also discussed risks of postoperative hematoma, paralysis, anesthetic complications including DVT, PE, death, cardiopulmonary dysfunction. In addition, the perioperative and postoperative courses were discussed in detail including the rehabilitative time and return to functional activity and work. I provided the patient with an illustrated handout and utilized the appropriate surgical models. Apparently has gotten the clearance, she can come off her Xarelto for atrial fib and then resume it postoperatively when safe. Clearance from her medical physician. Discussed overnight in the hospital and specifically the risks and benefits, time to complete her recovery and specifically the risk of CSF leak and possibility of requiring a patch graft, etc. We will schedule her accordingly.  Plan  microlumbar decompression L4-5, L3-4  BISSELL, Conley Rolls., PA-C for Dr. Tonita Cong 02/01/2016, 10:49 AM

## 2016-02-03 ENCOUNTER — Ambulatory Visit (HOSPITAL_COMMUNITY)
Admission: RE | Admit: 2016-02-03 | Discharge: 2016-02-03 | Disposition: A | Discharge status: Home / Self Care | Payer: Medicare Other | Source: Ambulatory Visit | Attending: Orthopedic Surgery | Admitting: Orthopedic Surgery

## 2016-02-03 ENCOUNTER — Encounter (HOSPITAL_COMMUNITY)
Admission: RE | Admit: 2016-02-03 | Discharge: 2016-02-03 | Disposition: A | Discharge status: Home / Self Care | Payer: Medicare Other | Source: Ambulatory Visit | Attending: Specialist | Admitting: Specialist

## 2016-02-03 ENCOUNTER — Encounter (HOSPITAL_COMMUNITY): Payer: Self-pay

## 2016-02-03 DIAGNOSIS — Z79899 Other long term (current) drug therapy: Secondary | ICD-10-CM | POA: Diagnosis not present

## 2016-02-03 DIAGNOSIS — I10 Essential (primary) hypertension: Secondary | ICD-10-CM | POA: Diagnosis not present

## 2016-02-03 DIAGNOSIS — M48061 Spinal stenosis, lumbar region without neurogenic claudication: Secondary | ICD-10-CM

## 2016-02-03 DIAGNOSIS — M48062 Spinal stenosis, lumbar region with neurogenic claudication: Secondary | ICD-10-CM | POA: Diagnosis not present

## 2016-02-03 DIAGNOSIS — Z0183 Encounter for blood typing: Secondary | ICD-10-CM | POA: Diagnosis not present

## 2016-02-03 DIAGNOSIS — Z01812 Encounter for preprocedural laboratory examination: Secondary | ICD-10-CM | POA: Insufficient documentation

## 2016-02-03 DIAGNOSIS — Z01818 Encounter for other preprocedural examination: Secondary | ICD-10-CM | POA: Insufficient documentation

## 2016-02-03 DIAGNOSIS — M419 Scoliosis, unspecified: Secondary | ICD-10-CM | POA: Insufficient documentation

## 2016-02-03 DIAGNOSIS — M5136 Other intervertebral disc degeneration, lumbar region: Secondary | ICD-10-CM | POA: Diagnosis not present

## 2016-02-03 DIAGNOSIS — M069 Rheumatoid arthritis, unspecified: Secondary | ICD-10-CM | POA: Insufficient documentation

## 2016-02-03 DIAGNOSIS — I700 Atherosclerosis of aorta: Secondary | ICD-10-CM | POA: Insufficient documentation

## 2016-02-03 DIAGNOSIS — M4316 Spondylolisthesis, lumbar region: Secondary | ICD-10-CM | POA: Insufficient documentation

## 2016-02-03 DIAGNOSIS — I4891 Unspecified atrial fibrillation: Secondary | ICD-10-CM | POA: Diagnosis not present

## 2016-02-03 DIAGNOSIS — E785 Hyperlipidemia, unspecified: Secondary | ICD-10-CM | POA: Insufficient documentation

## 2016-02-03 DIAGNOSIS — E119 Type 2 diabetes mellitus without complications: Secondary | ICD-10-CM | POA: Diagnosis not present

## 2016-02-03 DIAGNOSIS — K219 Gastro-esophageal reflux disease without esophagitis: Secondary | ICD-10-CM | POA: Diagnosis not present

## 2016-02-03 HISTORY — DX: Peripheral vascular disease, unspecified: I73.9

## 2016-02-03 LAB — CBC
HCT: 33.5 % — ABNORMAL LOW (ref 36.0–46.0)
Hemoglobin: 9.5 g/dL — ABNORMAL LOW (ref 12.0–15.0)
MCH: 21.9 pg — ABNORMAL LOW (ref 26.0–34.0)
MCHC: 28.4 g/dL — ABNORMAL LOW (ref 30.0–36.0)
MCV: 77.4 fL — ABNORMAL LOW (ref 78.0–100.0)
PLATELETS: 296 10*3/uL (ref 150–400)
RBC: 4.33 MIL/uL (ref 3.87–5.11)
RDW: 18.4 % — AB (ref 11.5–15.5)
WBC: 9.8 10*3/uL (ref 4.0–10.5)

## 2016-02-03 LAB — SURGICAL PCR SCREEN
MRSA, PCR: NEGATIVE
Staphylococcus aureus: NEGATIVE

## 2016-02-03 LAB — BASIC METABOLIC PANEL
Anion gap: 9 (ref 5–15)
BUN: 18 mg/dL (ref 6–20)
CO2: 26 mmol/L (ref 22–32)
CREATININE: 0.53 mg/dL (ref 0.44–1.00)
Calcium: 9.2 mg/dL (ref 8.9–10.3)
Chloride: 106 mmol/L (ref 101–111)
Glucose, Bld: 122 mg/dL — ABNORMAL HIGH (ref 65–99)
Potassium: 3.9 mmol/L (ref 3.5–5.1)
SODIUM: 141 mmol/L (ref 135–145)

## 2016-02-03 LAB — APTT: APTT: 34 s (ref 24–36)

## 2016-02-03 LAB — PROTIME-INR
INR: 1.98
PROTHROMBIN TIME: 22.8 s — AB (ref 11.4–15.2)

## 2016-02-03 LAB — GLUCOSE, CAPILLARY: GLUCOSE-CAPILLARY: 145 mg/dL — AB (ref 65–99)

## 2016-02-04 LAB — HEMOGLOBIN A1C
Hgb A1c MFr Bld: 6 % — ABNORMAL HIGH (ref 4.8–5.6)
Mean Plasma Glucose: 126 mg/dL

## 2016-02-10 ENCOUNTER — Encounter (HOSPITAL_COMMUNITY)
Admission: RE | Disposition: A | Discharge status: Home / Self Care | Payer: Self-pay | Source: Ambulatory Visit | Attending: Specialist

## 2016-02-10 ENCOUNTER — Ambulatory Visit (HOSPITAL_COMMUNITY): Payer: Medicare Other

## 2016-02-10 ENCOUNTER — Ambulatory Visit (HOSPITAL_COMMUNITY): Payer: Medicare Other | Admitting: Anesthesiology

## 2016-02-10 ENCOUNTER — Ambulatory Visit (HOSPITAL_COMMUNITY)
Admission: RE | Admit: 2016-02-10 | Discharge: 2016-02-11 | Disposition: A | Discharge status: Home / Self Care | Payer: Medicare Other | Source: Ambulatory Visit | Attending: Specialist | Admitting: Specialist

## 2016-02-10 ENCOUNTER — Encounter (HOSPITAL_COMMUNITY): Payer: Self-pay

## 2016-02-10 DIAGNOSIS — I129 Hypertensive chronic kidney disease with stage 1 through stage 4 chronic kidney disease, or unspecified chronic kidney disease: Secondary | ICD-10-CM | POA: Insufficient documentation

## 2016-02-10 DIAGNOSIS — I700 Atherosclerosis of aorta: Secondary | ICD-10-CM | POA: Diagnosis not present

## 2016-02-10 DIAGNOSIS — E042 Nontoxic multinodular goiter: Secondary | ICD-10-CM | POA: Diagnosis not present

## 2016-02-10 DIAGNOSIS — K449 Diaphragmatic hernia without obstruction or gangrene: Secondary | ICD-10-CM | POA: Insufficient documentation

## 2016-02-10 DIAGNOSIS — F329 Major depressive disorder, single episode, unspecified: Secondary | ICD-10-CM | POA: Diagnosis not present

## 2016-02-10 DIAGNOSIS — F419 Anxiety disorder, unspecified: Secondary | ICD-10-CM | POA: Diagnosis not present

## 2016-02-10 DIAGNOSIS — E119 Type 2 diabetes mellitus without complications: Secondary | ICD-10-CM | POA: Diagnosis not present

## 2016-02-10 DIAGNOSIS — Z823 Family history of stroke: Secondary | ICD-10-CM | POA: Insufficient documentation

## 2016-02-10 DIAGNOSIS — E785 Hyperlipidemia, unspecified: Secondary | ICD-10-CM | POA: Insufficient documentation

## 2016-02-10 DIAGNOSIS — Z419 Encounter for procedure for purposes other than remedying health state, unspecified: Secondary | ICD-10-CM

## 2016-02-10 DIAGNOSIS — M48061 Spinal stenosis, lumbar region without neurogenic claudication: Secondary | ICD-10-CM

## 2016-02-10 DIAGNOSIS — E1122 Type 2 diabetes mellitus with diabetic chronic kidney disease: Secondary | ICD-10-CM | POA: Insufficient documentation

## 2016-02-10 DIAGNOSIS — M7138 Other bursal cyst, other site: Secondary | ICD-10-CM | POA: Diagnosis not present

## 2016-02-10 DIAGNOSIS — K219 Gastro-esophageal reflux disease without esophagitis: Secondary | ICD-10-CM | POA: Diagnosis not present

## 2016-02-10 DIAGNOSIS — I10 Essential (primary) hypertension: Secondary | ICD-10-CM | POA: Diagnosis not present

## 2016-02-10 DIAGNOSIS — E1151 Type 2 diabetes mellitus with diabetic peripheral angiopathy without gangrene: Secondary | ICD-10-CM | POA: Diagnosis not present

## 2016-02-10 DIAGNOSIS — I4891 Unspecified atrial fibrillation: Secondary | ICD-10-CM | POA: Insufficient documentation

## 2016-02-10 DIAGNOSIS — I708 Atherosclerosis of other arteries: Secondary | ICD-10-CM | POA: Diagnosis not present

## 2016-02-10 DIAGNOSIS — Z8249 Family history of ischemic heart disease and other diseases of the circulatory system: Secondary | ICD-10-CM | POA: Insufficient documentation

## 2016-02-10 DIAGNOSIS — M48062 Spinal stenosis, lumbar region with neurogenic claudication: Secondary | ICD-10-CM | POA: Diagnosis not present

## 2016-02-10 DIAGNOSIS — Z96643 Presence of artificial hip joint, bilateral: Secondary | ICD-10-CM | POA: Insufficient documentation

## 2016-02-10 DIAGNOSIS — Z7901 Long term (current) use of anticoagulants: Secondary | ICD-10-CM | POA: Insufficient documentation

## 2016-02-10 DIAGNOSIS — N183 Chronic kidney disease, stage 3 (moderate): Secondary | ICD-10-CM | POA: Diagnosis not present

## 2016-02-10 DIAGNOSIS — Z7984 Long term (current) use of oral hypoglycemic drugs: Secondary | ICD-10-CM | POA: Insufficient documentation

## 2016-02-10 DIAGNOSIS — D631 Anemia in chronic kidney disease: Secondary | ICD-10-CM | POA: Diagnosis not present

## 2016-02-10 DIAGNOSIS — M069 Rheumatoid arthritis, unspecified: Secondary | ICD-10-CM | POA: Diagnosis not present

## 2016-02-10 DIAGNOSIS — M961 Postlaminectomy syndrome, not elsewhere classified: Secondary | ICD-10-CM | POA: Diagnosis not present

## 2016-02-10 DIAGNOSIS — S34124A Incomplete lesion of L4 level of lumbar spinal cord, initial encounter: Secondary | ICD-10-CM | POA: Diagnosis not present

## 2016-02-10 HISTORY — PX: LUMBAR LAMINECTOMY/DECOMPRESSION MICRODISCECTOMY: SHX5026

## 2016-02-10 LAB — PROTIME-INR
INR: 1.12
PROTHROMBIN TIME: 14.5 s (ref 11.4–15.2)

## 2016-02-10 LAB — GLUCOSE, CAPILLARY
GLUCOSE-CAPILLARY: 132 mg/dL — AB (ref 65–99)
GLUCOSE-CAPILLARY: 247 mg/dL — AB (ref 65–99)
GLUCOSE-CAPILLARY: 439 mg/dL — AB (ref 65–99)
Glucose-Capillary: 85 mg/dL (ref 65–99)

## 2016-02-10 LAB — BASIC METABOLIC PANEL
ANION GAP: 9 (ref 5–15)
BUN: 18 mg/dL (ref 6–20)
CALCIUM: 8.6 mg/dL — AB (ref 8.9–10.3)
CO2: 25 mmol/L (ref 22–32)
Chloride: 102 mmol/L (ref 101–111)
Creatinine, Ser: 0.75 mg/dL (ref 0.44–1.00)
GFR calc Af Amer: >60 mL/min (ref >60)
GLUCOSE: 427 mg/dL — AB (ref 65–99)
Potassium: 3.9 mmol/L (ref 3.5–5.1)
SODIUM: 136 mmol/L (ref 135–145)

## 2016-02-10 LAB — TYPE AND SCREEN
ABO/RH(D): O NEG
ANTIBODY SCREEN: NEGATIVE

## 2016-02-10 SURGERY — LUMBAR LAMINECTOMY/DECOMPRESSION MICRODISCECTOMY 2 LEVELS
Anesthesia: General | Site: Back

## 2016-02-10 MED ORDER — NITROGLYCERIN 0.4 MG SL SUBL: 0.4000 mg | SUBLINGUAL_TABLET | SUBLINGUAL | Status: DC | PRN

## 2016-02-10 MED ORDER — POTASSIUM CHLORIDE ER 10 MEQ PO TBCR
20.0000 meq | EXTENDED_RELEASE_TABLET | Freq: Two times a day (BID) | ORAL | Status: DC
  Administered 2016-02-10: 20 meq via ORAL
  Filled 2016-02-10 (×4): qty 2

## 2016-02-10 MED ORDER — LACTATED RINGERS IV SOLN
INTRAVENOUS | Status: DC
  Administered 2016-02-10: 1000 mL via INTRAVENOUS
  Administered 2016-02-10: 12:00:00 via INTRAVENOUS

## 2016-02-10 MED ORDER — HEMOSTATIC AGENTS (NO CHARGE) OPTIME
TOPICAL | Status: DC | PRN
  Administered 2016-02-10: 1 via TOPICAL

## 2016-02-10 MED ORDER — BUPIVACAINE HCL (PF) 0.5 % IJ SOLN
INTRAMUSCULAR | Status: AC
Start: 2016-02-10 — End: 2016-02-10
  Filled 2016-02-10: qty 30

## 2016-02-10 MED ORDER — OXYCODONE-ACETAMINOPHEN 5-325 MG PO TABS: 1.0000 | ORAL_TABLET | ORAL | 0 refills | Status: DC | PRN

## 2016-02-10 MED ORDER — HYDROMORPHONE HCL 1 MG/ML IJ SOLN: 0.2500 mg | INTRAMUSCULAR | Status: DC | PRN

## 2016-02-10 MED ORDER — DOCUSATE SODIUM 100 MG PO CAPS
100.0000 mg | ORAL_CAPSULE | Freq: Two times a day (BID) | ORAL | Status: DC
  Administered 2016-02-10 – 2016-02-11 (×2): 100 mg via ORAL
  Filled 2016-02-10 (×2): qty 1

## 2016-02-10 MED ORDER — FENTANYL CITRATE (PF) 100 MCG/2ML IJ SOLN
INTRAMUSCULAR | Status: DC | PRN
  Administered 2016-02-10: 50 ug via INTRAVENOUS
  Administered 2016-02-10: 100 ug via INTRAVENOUS

## 2016-02-10 MED ORDER — SODIUM CHLORIDE 0.9 % IR SOLN
Status: DC | PRN
  Administered 2016-02-10: 1000 mL

## 2016-02-10 MED ORDER — DOCUSATE SODIUM 100 MG PO CAPS: 100.0000 mg | ORAL_CAPSULE | Freq: Two times a day (BID) | ORAL | 1 refills | Status: DC | PRN

## 2016-02-10 MED ORDER — PREDNISONE 5 MG PO TABS
5.0000 mg | ORAL_TABLET | Freq: Every day | ORAL | Status: DC
Start: 2016-02-11 — End: 2016-02-11
  Administered 2016-02-11: 5 mg via ORAL
  Filled 2016-02-10: qty 1

## 2016-02-10 MED ORDER — OXYCODONE-ACETAMINOPHEN 5-325 MG PO TABS
1.0000 | ORAL_TABLET | ORAL | Status: DC | PRN
  Administered 2016-02-10: 2 via ORAL
  Administered 2016-02-10 (×2): 1 via ORAL
  Administered 2016-02-11: 2 via ORAL
  Administered 2016-02-11: 1 via ORAL
  Filled 2016-02-10 (×2): qty 1
  Filled 2016-02-10 (×2): qty 2
  Filled 2016-02-10: qty 1

## 2016-02-10 MED ORDER — LIDOCAINE-EPINEPHRINE (PF) 1 %-1:200000 IJ SOLN
INTRAMUSCULAR | Status: DC | PRN
  Administered 2016-02-10: 12 mL

## 2016-02-10 MED ORDER — LEFLUNOMIDE 20 MG PO TABS
20.0000 mg | ORAL_TABLET | Freq: Every day | ORAL | Status: DC
Start: 2016-02-11 — End: 2016-02-11
  Administered 2016-02-11: 20 mg via ORAL
  Filled 2016-02-10: qty 1

## 2016-02-10 MED ORDER — FENTANYL CITRATE (PF) 250 MCG/5ML IJ SOLN
INTRAMUSCULAR | Status: AC
  Filled 2016-02-10: qty 5

## 2016-02-10 MED ORDER — PHENYLEPHRINE 40 MCG/ML (10ML) SYRINGE FOR IV PUSH (FOR BLOOD PRESSURE SUPPORT)
PREFILLED_SYRINGE | INTRAVENOUS | Status: AC
  Filled 2016-02-10: qty 10

## 2016-02-10 MED ORDER — HYDROMORPHONE HCL 1 MG/ML IJ SOLN: 0.5000 mg | INTRAMUSCULAR | Status: DC | PRN

## 2016-02-10 MED ORDER — METHOCARBAMOL 500 MG PO TABS
500.0000 mg | ORAL_TABLET | Freq: Four times a day (QID) | ORAL | Status: DC | PRN
  Administered 2016-02-10 – 2016-02-11 (×2): 500 mg via ORAL
  Filled 2016-02-10 (×2): qty 1

## 2016-02-10 MED ORDER — GABAPENTIN 300 MG PO CAPS
300.0000 mg | ORAL_CAPSULE | Freq: Every day | ORAL | Status: DC
Start: 2016-02-11 — End: 2016-02-11
  Administered 2016-02-11: 300 mg via ORAL
  Filled 2016-02-10: qty 1

## 2016-02-10 MED ORDER — SUGAMMADEX SODIUM 200 MG/2ML IV SOLN
INTRAVENOUS | Status: AC
  Filled 2016-02-10: qty 2

## 2016-02-10 MED ORDER — ROCURONIUM BROMIDE 10 MG/ML (PF) SYRINGE
PREFILLED_SYRINGE | INTRAVENOUS | Status: DC | PRN
  Administered 2016-02-10: 40 mg via INTRAVENOUS

## 2016-02-10 MED ORDER — ACETAMINOPHEN 650 MG RE SUPP: 650.0000 mg | RECTAL | Status: DC | PRN

## 2016-02-10 MED ORDER — ONDANSETRON HCL 4 MG/2ML IJ SOLN
INTRAMUSCULAR | Status: AC
  Filled 2016-02-10: qty 2

## 2016-02-10 MED ORDER — METOPROLOL TARTRATE 5 MG/5ML IV SOLN
INTRAVENOUS | Status: DC | PRN
  Administered 2016-02-10 (×3): 1 mg via INTRAVENOUS

## 2016-02-10 MED ORDER — LIDOCAINE 2% (20 MG/ML) 5 ML SYRINGE
INTRAMUSCULAR | Status: AC
  Filled 2016-02-10: qty 5

## 2016-02-10 MED ORDER — POLYETHYLENE GLYCOL 3350 17 G PO PACK: 17.0000 g | PACK | Freq: Every day | ORAL | Status: DC | PRN

## 2016-02-10 MED ORDER — CEFAZOLIN SODIUM-DEXTROSE 2-4 GM/100ML-% IV SOLN
2.0000 g | Freq: Three times a day (TID) | INTRAVENOUS | Status: AC
  Administered 2016-02-10 – 2016-02-11 (×3): 2 g via INTRAVENOUS
  Filled 2016-02-10 (×3): qty 100

## 2016-02-10 MED ORDER — DEXAMETHASONE SODIUM PHOSPHATE 10 MG/ML IJ SOLN
INTRAMUSCULAR | Status: DC | PRN
  Administered 2016-02-10: 10 mg via INTRAVENOUS

## 2016-02-10 MED ORDER — SODIUM CHLORIDE 0.9 % IR SOLN
Status: AC
  Filled 2016-02-10: qty 500000

## 2016-02-10 MED ORDER — PHENOL 1.4 % MT LIQD: 1.0000 | OROMUCOSAL | Status: DC | PRN

## 2016-02-10 MED ORDER — ONDANSETRON HCL 4 MG/2ML IJ SOLN: 4.0000 mg | INTRAMUSCULAR | Status: DC | PRN

## 2016-02-10 MED ORDER — FLUTICASONE PROPIONATE 50 MCG/ACT NA SUSP
1.0000 | Freq: Every day | NASAL | Status: DC | PRN
  Filled 2016-02-10: qty 16

## 2016-02-10 MED ORDER — PANTOPRAZOLE SODIUM 40 MG PO TBEC
40.0000 mg | DELAYED_RELEASE_TABLET | Freq: Every day | ORAL | Status: DC
  Administered 2016-02-11: 40 mg via ORAL
  Filled 2016-02-10: qty 1

## 2016-02-10 MED ORDER — BISACODYL 5 MG PO TBEC: 5.0000 mg | DELAYED_RELEASE_TABLET | Freq: Every day | ORAL | Status: DC | PRN

## 2016-02-10 MED ORDER — DEXAMETHASONE SODIUM PHOSPHATE 10 MG/ML IJ SOLN
INTRAMUSCULAR | Status: AC
  Filled 2016-02-10: qty 1

## 2016-02-10 MED ORDER — METHOCARBAMOL 1000 MG/10ML IJ SOLN
500.0000 mg | Freq: Four times a day (QID) | INTRAVENOUS | Status: DC | PRN
  Filled 2016-02-10: qty 5

## 2016-02-10 MED ORDER — CEFAZOLIN SODIUM-DEXTROSE 2-4 GM/100ML-% IV SOLN
INTRAVENOUS | Status: AC
  Filled 2016-02-10: qty 100

## 2016-02-10 MED ORDER — BUPROPION HCL ER (SR) 150 MG PO TB12
150.0000 mg | ORAL_TABLET | Freq: Two times a day (BID) | ORAL | Status: DC
  Administered 2016-02-10 – 2016-02-11 (×2): 150 mg via ORAL
  Filled 2016-02-10 (×2): qty 1

## 2016-02-10 MED ORDER — METOPROLOL TARTRATE 5 MG/5ML IV SOLN
INTRAVENOUS | Status: AC
  Filled 2016-02-10: qty 5

## 2016-02-10 MED ORDER — POLYETHYLENE GLYCOL 3350 17 G PO PACK: 17.0000 g | PACK | Freq: Every day | ORAL | 0 refills | Status: DC

## 2016-02-10 MED ORDER — HYDROCODONE-ACETAMINOPHEN 5-325 MG PO TABS: 1.0000 | ORAL_TABLET | ORAL | Status: DC | PRN

## 2016-02-10 MED ORDER — INSULIN ASPART 100 UNIT/ML ~~LOC~~ SOLN
0.0000 [IU] | Freq: Three times a day (TID) | SUBCUTANEOUS | Status: DC
  Administered 2016-02-10: 3 [IU] via SUBCUTANEOUS

## 2016-02-10 MED ORDER — RISAQUAD PO CAPS
1.0000 | ORAL_CAPSULE | Freq: Every day | ORAL | Status: DC
  Administered 2016-02-10 – 2016-02-11 (×2): 1 via ORAL
  Filled 2016-02-10 (×2): qty 1

## 2016-02-10 MED ORDER — PROPOFOL 10 MG/ML IV BOLUS
INTRAVENOUS | Status: AC
  Filled 2016-02-10: qty 20

## 2016-02-10 MED ORDER — ALUM & MAG HYDROXIDE-SIMETH 200-200-20 MG/5ML PO SUSP: 30.0000 mL | Freq: Four times a day (QID) | ORAL | Status: DC | PRN

## 2016-02-10 MED ORDER — METOPROLOL TARTRATE 50 MG PO TABS
75.0000 mg | ORAL_TABLET | Freq: Two times a day (BID) | ORAL | Status: DC
  Administered 2016-02-10 – 2016-02-11 (×2): 75 mg via ORAL
  Filled 2016-02-10 (×2): qty 1

## 2016-02-10 MED ORDER — KCL IN DEXTROSE-NACL 20-5-0.45 MEQ/L-%-% IV SOLN
INTRAVENOUS | Status: DC
  Administered 2016-02-10: 50 mL/h via INTRAVENOUS
  Filled 2016-02-10 (×2): qty 1000

## 2016-02-10 MED ORDER — CEFAZOLIN SODIUM-DEXTROSE 2-4 GM/100ML-% IV SOLN
2.0000 g | INTRAVENOUS | Status: AC
  Administered 2016-02-10: 2 g via INTRAVENOUS
  Filled 2016-02-10: qty 100

## 2016-02-10 MED ORDER — ACETAMINOPHEN 325 MG PO TABS: 650.0000 mg | ORAL_TABLET | ORAL | Status: DC | PRN

## 2016-02-10 MED ORDER — DILTIAZEM HCL ER COATED BEADS 240 MG PO CP24
240.0000 mg | ORAL_CAPSULE | Freq: Every day | ORAL | Status: DC
  Administered 2016-02-11: 240 mg via ORAL
  Filled 2016-02-10: qty 1

## 2016-02-10 MED ORDER — METOPROLOL TARTRATE 5 MG/5ML IV SOLN
1.0000 mg | INTRAVENOUS | Status: AC | PRN
  Administered 2016-02-10 (×2): 1 mg via INTRAVENOUS

## 2016-02-10 MED ORDER — MAGNESIUM CITRATE PO SOLN: 1.0000 | Freq: Once | ORAL | Status: DC | PRN

## 2016-02-10 MED ORDER — ROCURONIUM BROMIDE 50 MG/5ML IV SOSY
PREFILLED_SYRINGE | INTRAVENOUS | Status: AC
Start: 2016-02-10 — End: 2016-02-10
  Filled 2016-02-10: qty 5

## 2016-02-10 MED ORDER — SUGAMMADEX SODIUM 200 MG/2ML IV SOLN
INTRAVENOUS | Status: DC | PRN
  Administered 2016-02-10: 200 mg via INTRAVENOUS

## 2016-02-10 MED ORDER — LIDOCAINE 2% (20 MG/ML) 5 ML SYRINGE
INTRAMUSCULAR | Status: DC | PRN
  Administered 2016-02-10: 60 mg via INTRAVENOUS

## 2016-02-10 MED ORDER — LIDOCAINE-EPINEPHRINE (PF) 1 %-1:200000 IJ SOLN
INTRAMUSCULAR | Status: AC
  Filled 2016-02-10: qty 30

## 2016-02-10 MED ORDER — MENTHOL 3 MG MT LOZG: 1.0000 | LOZENGE | OROMUCOSAL | Status: DC | PRN

## 2016-02-10 MED ORDER — PROPOFOL 10 MG/ML IV BOLUS
INTRAVENOUS | Status: DC | PRN
  Administered 2016-02-10: 130 mg via INTRAVENOUS

## 2016-02-10 MED ORDER — PHENYLEPHRINE 40 MCG/ML (10ML) SYRINGE FOR IV PUSH (FOR BLOOD PRESSURE SUPPORT)
PREFILLED_SYRINGE | INTRAVENOUS | Status: DC | PRN
  Administered 2016-02-10 (×4): 80 ug via INTRAVENOUS
  Administered 2016-02-10 (×2): 40 ug via INTRAVENOUS
  Administered 2016-02-10: 80 ug via INTRAVENOUS

## 2016-02-10 MED ORDER — FUROSEMIDE 40 MG PO TABS
40.0000 mg | ORAL_TABLET | Freq: Every day | ORAL | Status: DC
  Administered 2016-02-10 – 2016-02-11 (×2): 40 mg via ORAL
  Filled 2016-02-10 (×2): qty 1

## 2016-02-10 SURGICAL SUPPLY — 59 items
AGENT HMST SPONGE THK3/8 (HEMOSTASIS) ×1
BAG SPEC THK2 15X12 ZIP CLS (MISCELLANEOUS) ×1
BAG ZIPLOCK 12X15 (MISCELLANEOUS) ×1 IMPLANT
CLEANER TIP ELECTROSURG 2X2 (MISCELLANEOUS) ×2 IMPLANT
CLOTH 2% CHLOROHEXIDINE 3PK (PERSONAL CARE ITEMS) ×2 IMPLANT
DRAPE MICROSCOPE LEICA (MISCELLANEOUS) ×2 IMPLANT
DRAPE SHEET LG 3/4 BI-LAMINATE (DRAPES) ×1 IMPLANT
DRAPE SURG 17X11 SM STRL (DRAPES) ×2 IMPLANT
DRAPE UTILITY XL STRL (DRAPES) ×2 IMPLANT
DRSG AQUACEL AG ADV 3.5X 6 (GAUZE/BANDAGES/DRESSINGS) ×1 IMPLANT
DURAPREP 26ML APPLICATOR (WOUND CARE) ×2 IMPLANT
DURASEAL SPINE SEALANT 3ML (MISCELLANEOUS) IMPLANT
ELECT BLADE TIP CTD 4 INCH (ELECTRODE) ×1 IMPLANT
ELECT REM PT RETURN 9FT ADLT (ELECTROSURGICAL) ×2
ELECTRODE REM PT RTRN 9FT ADLT (ELECTROSURGICAL) ×1 IMPLANT
GLOVE BIO SURGEON STRL SZ 6.5 (GLOVE) ×1 IMPLANT
GLOVE BIOGEL PI IND STRL 6.5 (GLOVE) IMPLANT
GLOVE BIOGEL PI IND STRL 7.0 (GLOVE) ×1 IMPLANT
GLOVE BIOGEL PI IND STRL 7.5 (GLOVE) IMPLANT
GLOVE BIOGEL PI INDICATOR 6.5 (GLOVE) ×1
GLOVE BIOGEL PI INDICATOR 7.0 (GLOVE) ×1
GLOVE BIOGEL PI INDICATOR 7.5 (GLOVE) ×1
GLOVE SURG SS PI 7.0 STRL IVOR (GLOVE) ×2 IMPLANT
GLOVE SURG SS PI 7.5 STRL IVOR (GLOVE) ×1 IMPLANT
GLOVE SURG SS PI 8.0 STRL IVOR (GLOVE) ×4 IMPLANT
GOWN STRL REUS W/TWL LRG LVL3 (GOWN DISPOSABLE) ×1 IMPLANT
GOWN STRL REUS W/TWL XL LVL3 (GOWN DISPOSABLE) ×5 IMPLANT
HEMOSTAT SPONGE AVITENE ULTRA (HEMOSTASIS) ×1 IMPLANT
IV CATH 14GX2 1/4 (CATHETERS) IMPLANT
KIT BASIN OR (CUSTOM PROCEDURE TRAY) ×2 IMPLANT
KIT POSITIONING SURG ANDREWS (MISCELLANEOUS) ×2 IMPLANT
MANIFOLD NEPTUNE II (INSTRUMENTS) ×2 IMPLANT
MARKER SKIN DUAL TIP RULER LAB (MISCELLANEOUS) ×2 IMPLANT
NDL SPNL 18GX3.5 QUINCKE PK (NEEDLE) ×2 IMPLANT
NEEDLE SPNL 18GX3.5 QUINCKE PK (NEEDLE) ×4 IMPLANT
PACK LAMINECTOMY ORTHO (CUSTOM PROCEDURE TRAY) ×2 IMPLANT
PAD TELFA 2X3 NADH STRL (GAUZE/BANDAGES/DRESSINGS) ×1 IMPLANT
PATTIES SURGICAL .5 X.5 (GAUZE/BANDAGES/DRESSINGS) ×2 IMPLANT
PATTIES SURGICAL .75X.75 (GAUZE/BANDAGES/DRESSINGS) ×2 IMPLANT
PATTIES SURGICAL 1X1 (DISPOSABLE) IMPLANT
RUBBERBAND STERILE (MISCELLANEOUS) ×2 IMPLANT
SPONGE LAP 4X18 X RAY DECT (DISPOSABLE) IMPLANT
SPONGE SURGIFOAM ABS GEL 100 (HEMOSTASIS) ×1 IMPLANT
STAPLER VISISTAT (STAPLE) ×1 IMPLANT
STRIP CLOSURE SKIN 1/2X4 (GAUZE/BANDAGES/DRESSINGS) ×1 IMPLANT
SUT NURALON 4 0 TR CR/8 (SUTURE) IMPLANT
SUT PROLENE 3 0 PS 2 (SUTURE) IMPLANT
SUT VIC AB 1 CT1 27 (SUTURE) ×4
SUT VIC AB 1 CT1 27XBRD ANTBC (SUTURE) IMPLANT
SUT VIC AB 1-0 CT2 27 (SUTURE) IMPLANT
SUT VIC AB 2-0 CT1 27 (SUTURE) ×2
SUT VIC AB 2-0 CT1 TAPERPNT 27 (SUTURE) IMPLANT
SUT VIC AB 2-0 CT2 27 (SUTURE) ×1 IMPLANT
SYR 3ML LL SCALE MARK (SYRINGE) ×1 IMPLANT
SYR BULB IRRIGATION 50ML (SYRINGE) ×1 IMPLANT
TOWEL OR 17X26 10 PK STRL BLUE (TOWEL DISPOSABLE) ×2 IMPLANT
TOWEL OR NON WOVEN STRL DISP B (DISPOSABLE) ×2 IMPLANT
TRAY FOLEY CATH SILVER 14FR (SET/KITS/TRAYS/PACK) ×1 IMPLANT
YANKAUER SUCT BULB TIP NO VENT (SUCTIONS) ×2 IMPLANT

## 2016-02-10 NOTE — H&P (View-Only) (Signed)
[name] is an 70 y.o. female.   Chief Complaint: back and B/L leg pain HPI: The patient is a 70 year old female who presents today for follow up of their back. The patient is being followed for their back pain. They are now year(s) out from when symptoms began. Symptoms reported today include: pain (low back) and leg pain (bilateral). The patient states that they are doing poorly. Current treatment includes: Gabapentin. The patient reports their current pain level to be 8 / 10. The patient presents today following a visit with Dr. Cyndy Freeze.  Past Medical History:  Diagnosis Date  . Actinic keratosis   . Anemia    hx of  . Anemia of chronic renal failure, stage 3 (moderate) 07/17/2015  . Anticoagulant causing adverse effect in therapeutic use 07/17/2015  . Anxiety   . Atrial fibrillation ([place])   . Atrial fibrillation ([place])   . Carpal tunnel syndrome   . Chest pain   . Depression   . Edema   . Gastroesophageal reflux disease   . Hepatitis 1970  . Hiatal hernia   . Hyperlipidemia   . Hypertension   . Iron deficiency anemia due to chronic blood loss 07/17/2015  . Jaundice    age 69  . Memory loss   . Rheumatoid arthritis(714.0)   . Thyroid disease    multi nodular goiter  . Type II diabetes mellitus ([place])     Past Surgical History:  Procedure Laterality Date  . CATARACT EXTRACTION Left   . COLONOSCOPY WITH PROPOFOL N/A 02/20/2015   Procedure: COLONOSCOPY WITH PROPOFOL;  Surgeon: Carol Ada, MD;  [facility];  Service: Endoscopy;  Laterality: N/A;  . ESOPHAGOGASTRODUODENOSCOPY (EGD) WITH PROPOFOL N/A 02/20/2015   Procedure: ESOPHAGOGASTRODUODENOSCOPY (EGD) WITH PROPOFOL;  Surgeon: Carol Ada, MD;  [facility];  Service: Endoscopy;  Laterality: N/A;  . HAND SURGERY  1995 and 1996   artificial joints both hands  . JOINT REPLACEMENT    . TOTAL HIP ARTHROPLASTY Left 2009  . TOTAL HIP ARTHROPLASTY Right 04/03/2013   Procedure: RIGHT TOTAL HIP ARTHROPLASTY  ANTERIOR APPROACH;  Surgeon: Gearlean Alf, MD;  Location: [facility];  Service: Orthopedics;  Laterality: Right;    Family History  Problem Relation Age of Onset  . Stroke Father   . Heart failure Sister   . Hypertension Sister   . Heart attack Neg Hx    Social History:  reports that she has never smoked. She has never used smokeless tobacco. She reports that she does not drink alcohol or use drugs.  Allergies: No Known Allergies   (Not in a hospital admission)  No results found for this or any previous visit (from the past 48 hour(s)). No results found.  Review of Systems  Constitutional: Negative.   HENT: Negative.   Eyes: Negative.   Respiratory: Negative.   Cardiovascular: Negative.   Gastrointestinal: Negative.   Genitourinary: Negative.   Musculoskeletal: Positive for back pain.  Skin: Negative.   Neurological: Positive for sensory change and focal weakness.  Psychiatric/Behavioral: Negative.     There were no vitals taken for this visit. Physical Exam  Constitutional: She appears well-developed and well-nourished. She appears distressed.  HENT:  Head: Normocephalic.  Eyes: Pupils are equal, round, and reactive to light.  Neck: Normal range of motion.  Cardiovascular: Normal rate.   Respiratory: Effort normal.  GI: Soft.  Musculoskeletal:  On exam, she is in moderate distress, walks with a forward flexed antalgic gait. Straight leg  raises buttock and thigh pain. She has slight quadriceps weakness bilaterally. Lumbar spine exam reveals no evidence of soft tissue swelling, deformity or skin ecchymosis. On palpation there is no tenderness of the lumbar spine. No flank pain with percussion. The abdomen is soft and nontender. Nontender over the trochanters. No cellulitis or lymphadenopathy.  Good range of motion of the lumbar spine without associated pain. Motor is 5/5 including EHL, tibialis anterior, plantar flexion, quadriceps and hamstrings. Patient is  normoreflexic. There is no Babinski or clonus. Sensory exam is intact to light touch. Patient has good distal pulses. No DVT. No pain and normal range of motion without instability of the hips, knees and ankles.  Neurological: She is alert.    MRI demonstrate severe multifactorial stenosis at 4-5, end-stage disc degeneration, minimal listhesis, no instability in flexion extension  Assessment/Plan Severe neurogenic claudication secondary to severe spinal stenosis at 4-5 extending into 3-4.  Discussed living with her symptoms. She no longer like to do so, significantly disabled by her symptoms and her options are decompression at 4-5 extending into 3-4. I had an extensive discussion of the risks and benefits of the lumbar decompression with the patient including bleeding, infection, damage to neurovascular structures, epidural fibrosis, CSF leak requiring repair. We also discussed increase in pain, adjacent segment disease, recurrent disc herniation, need for future surgery including repeat decompression and/or fusion. We also discussed risks of postoperative hematoma, paralysis, anesthetic complications including DVT, PE, death, cardiopulmonary dysfunction. In addition, the perioperative and postoperative courses were discussed in detail including the rehabilitative time and return to functional activity and work. I provided the patient with an illustrated handout and utilized the appropriate surgical models. Apparently has gotten the clearance, she can come off her Xarelto for atrial fib and then resume it postoperatively when safe. Clearance from her medical physician. Discussed overnight in the hospital and specifically the risks and benefits, time to complete her recovery and specifically the risk of CSF leak and possibility of requiring a patch graft, etc. We will schedule her accordingly.  Plan  microlumbar decompression L4-5, L3-4  Jayanth Szczesniak, Conley Rolls., PA-C for Dr. Tonita Cong 02/01/2016, 10:49 AM

## 2016-02-10 NOTE — Anesthesia Preprocedure Evaluation (Addendum)
Anesthesia Evaluation  Patient identified by MRN, date of birth, ID band Patient awake    Reviewed: Allergy & Precautions, NPO status , Patient's Chart, lab work & pertinent test results, reviewed documented beta blocker date and time   Airway Mallampati: II  TM Distance: >3 FB Neck ROM: Full    Dental  (+) Dental Advisory Given   Pulmonary neg pulmonary ROS,    breath sounds clear to auscultation       Cardiovascular hypertension, Pt. on medications and Pt. on home beta blockers + Peripheral Vascular Disease and + DVT (Off xarelto since 11/4)   Rhythm:Regular Rate:Normal     Neuro/Psych Anxiety Depression  Neuromuscular disease    GI/Hepatic Neg liver ROS, hiatal hernia, GERD  ,  Endo/Other  diabetes, Type 2, Oral Hypoglycemic Agents  Renal/GU negative Renal ROS     Musculoskeletal  (+) Arthritis ,   Abdominal   Peds  Hematology  (+) anemia ,   Anesthesia Other Findings   Reproductive/Obstetrics                            Lab Results  Component Value Date   WBC 9.8 02/03/2016   HGB 9.5 (L) 02/03/2016   HCT 33.5 (L) 02/03/2016   MCV 77.4 (L) 02/03/2016   PLT 296 02/03/2016   Lab Results  Component Value Date   CREATININE 0.53 02/03/2016   BUN 18 02/03/2016   NA 141 02/03/2016   K 3.9 02/03/2016   CL 106 02/03/2016   CO2 26 02/03/2016   Lab Results  Component Value Date   INR 1.12 02/10/2016   INR 1.98 02/03/2016   INR 1.37 02/21/2015    Anesthesia Physical Anesthesia Plan  ASA: III  Anesthesia Plan: General   Post-op Pain Management:    Induction: Intravenous  Airway Management Planned: Oral ETT  Additional Equipment:   Intra-op Plan:   Post-operative Plan: Extubation in OR  Informed Consent: I have reviewed the patients History and Physical, chart, labs and discussed the procedure including the risks, benefits and alternatives for the proposed anesthesia  with the patient or authorized representative who has indicated his/her understanding and acceptance.   Dental advisory given  Plan Discussed with: CRNA  Anesthesia Plan Comments:         Anesthesia Quick Evaluation

## 2016-02-10 NOTE — Transfer of Care (Signed)
Immediate Anesthesia Transfer of Care Note  Patient: [name]  Procedure(s) Performed: Procedure(s): MICROLUMBAR DECOMPRESSION L4-L5 AND L3- L4, AND EXCISION OF SYNOVIAL CYST L4-L5 (N/A)  Patient Location: PACU  Anesthesia Type:General  Level of Consciousness:  sedated, patient cooperative and responds to stimulation  Airway & Oxygen Therapy:Patient Spontanous Breathing and Patient connected to face mask oxgen  Post-op Assessment:  Report given to PACU RN and Post -op Vital signs reviewed and stable  Post vital signs:  Reviewed and stable  Last Vitals:  Vitals:   02/10/16 0842  BP: (!) 148/79  Pulse: (!) 115  Resp: 20  Temp: [id number] C    Complications: No apparent anesthesia complications

## 2016-02-10 NOTE — Interval H&P Note (Signed)
History and Physical Interval Note:  02/10/2016 10:54 AM  [name]  has presented today for surgery, with the diagnosis of Spinal Stenosis L4-L5  The various methods of treatment have been discussed with the patient and family. After consideration of risks, benefits and other options for treatment, the patient has consented to  Procedure(s): MICROLUMBAR DECOMPRESSION L4-L5   and L3- L4 (N/A) as a surgical intervention .  The patient's history has been reviewed, patient examined, no change in status, stable for surgery.  I have reviewed the patient's chart and labs.  Questions were answered to the patient's satisfaction.     Braley Luckenbaugh C

## 2016-02-10 NOTE — Discharge Instructions (Signed)
Walk As Tolerated utilizing back precautions.  No bending, twisting, or lifting.  No driving for 2 weeks.   °Aquacel dressing may remain in place until follow up. May shower with aquacel dressing in place. If the dressing peels off or becomes saturated, you may remove aquacel dressing and place gauze and tape dressing which should be kept clean and dry and changed daily. Do not remove steri-strips if they are present. °See Dr. Aleathea Pugmire in office in 10 to 14 days. Begin taking aspirin 81mg per day starting 4 days after your surgery if not allergic to aspirin or on another blood thinner. °Walk daily even outside. Use a cane or walker only if necessary. °Avoid sitting on soft sofas. ° °

## 2016-02-10 NOTE — Anesthesia Procedure Notes (Signed)
Procedure Name: Intubation Date/Time: 02/10/2016 11:26 AM Performed by: Talbot Grumbling Pre-anesthesia Checklist: Patient identified, Emergency Drugs available, Suction available and Patient being monitored Patient Re-evaluated:Patient Re-evaluated prior to inductionOxygen Delivery Method: Circle system utilized Preoxygenation: Pre-oxygenation with 100% oxygen Intubation Type: IV induction Ventilation: Mask ventilation without difficulty Laryngoscope Size: Glidescope (Attempt x 2 with Sabra Heck 2, 1 by self and 1 by Dr. Ola Spurr without sucess) Grade View: Grade II Tube type: Oral Tube size: 7.0 mm Number of attempts: 3 (2 unsuccessful with Sabra Heck 2, successful with Glidescope) Airway Equipment and Method: Stylet and Video-laryngoscopy Placement Confirmation: ETT inserted through vocal cords under direct vision,  positive ETCO2 and breath sounds checked- equal and bilateral Secured at: 21 cm Tube secured with: Tape Dental Injury: Teeth and Oropharynx as per pre-operative assessment  Difficulty Due To: Difficult Airway- due to immobile epiglottis and Difficult Airway- due to anterior larynx Comments: Recommend-glidescope.

## 2016-02-10 NOTE — Brief Op Note (Addendum)
02/10/2016  12:56 PM  PATIENT:  [name]  70 y.o. female  PRE-OPERATIVE DIAGNOSIS:  Spinal Stenosis L4-L5  POST-OPERATIVE DIAGNOSIS:  Spinal Stenosis L4-L5  PROCEDURE:  Procedure(s): MICROLUMBAR DECOMPRESSION L4-L5 AND L3- L4, AND EXCISION OF SYNOVIAL CYST L4-L5 (N/A)  SURGEON:  Surgeon(s) and Role:    * Susa Day, MD - Primary  PHYSICIAN ASSISTANT:   ASSISTANTS: Bissell   ANESTHESIA:   general  EBL:  Total I/O In: 1000 [I.V.:1000] Out: 300 [Urine:250; Blood:50]  BLOOD ADMINISTERED:none  DRAINS: none   LOCAL MEDICATIONS USED:  LIDOCAINE   SPECIMEN:  Source of Specimen:  L45 cyst  DISPOSITION OF SPECIMEN:  PATHOLOGY  COUNTS:  YES  TOURNIQUET:  * No tourniquets in log *  DICTATION: .Other Dictation: Dictation Number   no dictation number given  PLAN OF CARE: Admit for overnight observation  PATIENT DISPOSITION:  PACU - hemodynamically stable.   Delay start of Pharmacological VTE agent (>24hrs) due to surgical blood loss or risk of bleeding: yes

## 2016-02-10 NOTE — Anesthesia Postprocedure Evaluation (Signed)
Anesthesia Post Note  Patient: [name]  Procedure(s) Performed: Procedure(s) (LRB): MICROLUMBAR DECOMPRESSION L4-L5 AND L3- L4, AND EXCISION OF SYNOVIAL CYST L4-L5 (N/A)  Patient location during evaluation: PACU Anesthesia Type: General Level of consciousness: awake and alert Pain management: pain level controlled Vital Signs Assessment: post-procedure vital signs reviewed and stable Respiratory status: spontaneous breathing, nonlabored ventilation, respiratory function stable and patient connected to nasal cannula oxygen Cardiovascular status: blood pressure returned to baseline and stable Postop Assessment: no signs of nausea or vomiting Anesthetic complications: no    Last Vitals:  Vitals:   02/10/16 1353 02/10/16 1400  BP: (!) 142/78   Pulse: 97   Resp: 15   Temp:  36.8 C    Last Pain:  Vitals:   02/10/16 1400  TempSrc:   PainSc: 0-No pain    LLE Motor Response: Purposeful movement (02/10/16 1400) LLE Sensation: Full sensation;No numbness;No pain;No tingling (02/10/16 1400) RLE Motor Response: Purposeful movement (02/10/16 1400) RLE Sensation: Full sensation;No numbness;No pain;No tingling (02/10/16 1400)      Tiajuana Amass

## 2016-02-11 DIAGNOSIS — M48061 Spinal stenosis, lumbar region without neurogenic claudication: Secondary | ICD-10-CM | POA: Diagnosis not present

## 2016-02-11 LAB — BASIC METABOLIC PANEL
ANION GAP: 10 (ref 5–15)
BUN: 17 mg/dL (ref 6–20)
CALCIUM: 8.9 mg/dL (ref 8.9–10.3)
CO2: 25 mmol/L (ref 22–32)
Chloride: 104 mmol/L (ref 101–111)
Creatinine, Ser: 0.67 mg/dL (ref 0.44–1.00)
GLUCOSE: 280 mg/dL — AB (ref 65–99)
Potassium: 4.3 mmol/L (ref 3.5–5.1)
SODIUM: 139 mmol/L (ref 135–145)

## 2016-02-11 LAB — GLUCOSE, CAPILLARY
GLUCOSE-CAPILLARY: 243 mg/dL — AB (ref 65–99)
Glucose-Capillary: 264 mg/dL — ABNORMAL HIGH (ref 65–99)

## 2016-02-11 MED ORDER — POTASSIUM CHLORIDE CRYS ER 20 MEQ PO TBCR
20.0000 meq | EXTENDED_RELEASE_TABLET | Freq: Two times a day (BID) | ORAL | Status: DC
  Administered 2016-02-11: 20 meq via ORAL
  Filled 2016-02-11: qty 1

## 2016-02-11 MED ORDER — RIVAROXABAN 20 MG PO TABS: 20.0000 mg | ORAL_TABLET | Freq: Every day | ORAL | 3 refills | Status: DC

## 2016-02-11 MED ORDER — OMEGA 3 1200 MG PO CAPS: 1.0000 | ORAL_CAPSULE | Freq: Every day | ORAL | Status: DC

## 2016-02-11 MED ORDER — INSULIN ASPART 100 UNIT/ML ~~LOC~~ SOLN
0.0000 [IU] | Freq: Three times a day (TID) | SUBCUTANEOUS | Status: DC
  Administered 2016-02-11: 8 [IU] via SUBCUTANEOUS
  Administered 2016-02-11: 5 [IU] via SUBCUTANEOUS

## 2016-02-11 MED ORDER — INSULIN ASPART 100 UNIT/ML ~~LOC~~ SOLN
0.0000 [IU] | Freq: Every day | SUBCUTANEOUS | Status: DC
  Administered 2016-02-11: 5 [IU] via SUBCUTANEOUS

## 2016-02-11 MED ORDER — NAPROXEN SODIUM 220 MG PO TABS: 440.0000 mg | ORAL_TABLET | Freq: Two times a day (BID) | ORAL | Status: DC | PRN

## 2016-02-11 NOTE — Op Note (Signed)
[name], [name] NO.:  [id number]  MEDICAL RECORD NO.:  [id number]  LOCATION:  [id number]                         FACILITY:  [facility]  PHYSICIAN:  Susa Day, M.D.    DATE OF BIRTH:  [birth year]  DATE OF PROCEDURE:  02/10/2016 DATE OF DISCHARGE:                              OPERATIVE REPORT   PREOPERATIVE DIAGNOSES: 1. Spinal stenosis, L4-5, L3-4. 2. Large synovial cyst, L4-5, left.  POSTOPERATIVE DIAGNOSES: 1. Spinal stenosis, L4-5, L3-4. 2. Large synovial cyst, L4-5, left.  PROCEDURES PERFORMED: 1. Microlumbar decompression at L3-4 and L4-5. 2. Gill laminectomy of L4. 3. Foraminotomies, L4-L5 bilaterally. 4. Excision of large synovial cyst, L4-5, left.  ANESTHESIA:  General.  ASSISTANT:  Cleophas Dunker, PA.  SPECIMEN:  Synovial cyst presumed to Pathology.  HISTORY:  A 70 year old, bilateral lower extremity radicular pain, neurogenic claudication, neural tension signs, EHL weakness secondary to large synovial cyst compressing the thecal sac.  She was indicated for microlumbar decompression at L4-5 and at L3-4 and removing the neural arch due to the severe compression.  She had a large synovial cyst compressing the thecal sac.  We discussed decompression, and risk and benefits discussed including bleeding, infection, damage to the neurovascular structures, DVT, PE, anesthetic complications, etc.  TECHNIQUE:  With the patient in supine position, after the induction of adequate general anesthesia, 2 g of Kefzol, placed prone on the [place] frame.  All bony prominences were well padded.  Lumbar region was prepped and draped in usual sterile fashion.  Two 18-gauge spinal needles were utilized to localize the L4-5 and L3-4 interspace, confirmed with x-ray.  An incision was made from the spinous process of L3 to below L5.  Subcutaneous tissue was dissected.  Electrocautery was utilized to achieve hemostasis.  Dorsolumbar fascia was identified, divided  in line with skin incision.  Paraspinous muscle was elevated from lamina of L3-4 and L4-5 after infiltration with 0.25% lidocaine with epinephrine.  Leksell rongeur was utilized to remove the spinous processes of L4, partial of L5 and of L3.  There were hypertrophic facets noted and narrow interlaminar window under the operating microscope, we utilized a straight curette to detach the ligamentum flavum from the caudad edge of L4.  Placed a neuro patty beneath the ligamentum flavum.  We performed Gill laminectomy of L4, with a 2-mm Kerrison.  I placed neuro patties beneath the thecal sac.  There was severe compression of the thecal sac bilaterally, predominantly on the left where the cyst was noted from.  After removal of the neural arch of 4 and removal of ligamentum flavum from the interspace at L3-4, there was moderate stenosis here particularly on the lateral recess and no disk herniation noted.  Decompressed the lateral recess of the medial border of the pedicle at L3-4, performing foraminotomies of L4.  Then, we detached the ligamentum flavum from the cephalad edge of L5.  From the right, we decompressed the lateral recess of the medial border of the pedicle.  Hypertrophic ligamentum and facet were noted.  I performed foraminotomies of L5 on the right.  A large synovial cyst was encountered.  We meticulously developed a plane between the thecal sac and the large  synovial cyst measuring 2 x 1.5 cm.  We removed the cyst emanating from the L4-5 facet.  There was a brownish cystic fluid removed from that consistent with a synovial cyst.  There was some hardened debris within the cyst.  After removal of the cyst, decompressed the lateral recess at the medial border of the pedicle, we sent that to Pathology for the appropriate evaluation.  I performed foraminotomy of L5 on the left as well.  No disk herniation noted bilaterally.  Bipolar electrocautery was utilized to achieve  hemostasis. Good restoration of thecal sac.  No probe passed freely at the foramen at L3, L4 and L5 bilaterally following the decompression.  Good restoration of the thecal sac again.  We performed a Valsalva maneuver, no active CSF leaking.  After copious irrigation, I placed Gelfoam in the laminotomy defect.  After confirmatory radiograph obtained with a marker at the foramen of L3 and below L5, we addressed to Pathology.  I then removed the [facility] retractor, irrigated the paraspinous musculature, no active bleeding.  Reapproximated the dorsolumbar fascia with #1 Vicryl interrupted figure-of-eight sutures, subcutaneous tissue with 2-0 and skin with staples.  Wound was dressed sterilely.  Placed supine on the hospital bed, extubated without difficulty, and transported to the recovery room in satisfactory condition.  The patient tolerated the procedure well.  No complications.  Assistant, Cleophas Dunker, PA, was used at the case for patient positioning, gentle intermittent neural traction and closure.  We did TED stocking throughout the case and PAS stocking as well.  Blood loss, 25 mL.     Susa Day, M.D.     Geralynn Rile  D:  02/10/2016  T:  02/11/2016  Job:  [id number]

## 2016-02-11 NOTE — Care Management Note (Signed)
Case Management Note  Patient Details  Name: [name] MRN: [id number] Date of Birth: 1945-07-11  Subjective/Objective:                  spinal stenosis Action/Plan: Discharge planning Expected Discharge Date:  11/             Expected Discharge Plan:  (P) Home/Self Care  In-House Referral:     Discharge planning Services  (P) CM Consult  Post Acute Care Choice:    Choice offered to:  (P) Patient  DME Arranged:  (P) N/A DME Agency:  (P) NA  HH Arranged:  (P) NA HH Agency:     Status of Service:  (P) Completed, signed off  If discussed at [place] of Stay Meetings, dates discussed:    Additional Comments: CM met with pt in room to confirm no DME or HH needs; pt confirms.  No other CM needs were communicated. Dellie Catholic, RN 02/11/2016, 12:38 PM

## 2016-02-11 NOTE — Progress Notes (Signed)
Subjective: 1 Day Post-Op Procedure(s) (LRB): MICROLUMBAR DECOMPRESSION L4-L5 AND L3- L4, AND EXCISION OF SYNOVIAL CYST L4-L5 (N/A) Patient reports pain as 3 on 0-10 scale.    Objective: Vital signs in last 24 hours: Temp:  [97.4 F (36.3 C)-98.3 F (36.8 C)] 97.5 F (36.4 C) (11/09 0558) Pulse Rate:  [65-115] 80 (11/09 0558) Resp:  [14-22] 14 (11/09 0558) BP: (107-156)/(59-101) 110/67 (11/09 0558) SpO2:  [97 %-100 %] 100 % (11/09 0558) Weight:  [82.1 kg (181 lb)] 82.1 kg (181 lb) (11/08 1417)  Intake/Output from previous day: 11/08 0701 - 11/09 0700 In: 2923.3 [P.O.:940; I.V.:1983.3] Out: 3180 [Urine:3130; Blood:50] Intake/Output this shift: No intake/output data recorded.  No results for input(s): HGB in the last 72 hours. No results for input(s): WBC, RBC, HCT, PLT in the last 72 hours.  Recent Labs  02/10/16 2243 02/11/16 0413  NA 136 139  K 3.9 4.3  CL 102 104  CO2 25 25  BUN 18 17  CREATININE 0.75 0.67  GLUCOSE 427* 280*  CALCIUM 8.6* 8.9    Recent Labs  02/10/16 0854  INR 1.12    Neurologically intact Neurovascular intact Intact pulses distally Incision: dressing C/D/I Compartment soft  Assessment/Plan: 1 Day Post-Op Procedure(s) (LRB): MICROLUMBAR DECOMPRESSION L4-L5 AND L3- L4, AND EXCISION OF SYNOVIAL CYST L4-L5 (N/A) Advance diet Up with therapy Discharge home with home health  Monitor  Glucose. Reduce D/C instructions given.  Gwendloyn Forsee C 02/11/2016, 7:38 AM

## 2016-02-11 NOTE — Evaluation (Signed)
Physical Therapy Evaluation Patient Details Name: [name] MRN: [id number] DOB: 1945-06-25 Today's Date: 02/11/2016   History of Present Illness  s/p L3-4 and L4-5 decompression and excision of large synovial cyst at L4-5  Clinical Impression  The patient is mobilizing very well. Ready for DC today. No further PT is indicated.    Follow Up Recommendations No PT follow up    Equipment Recommendations  None recommended by PT    Recommendations for Other Services       Precautions / Restrictions Precautions Precautions: Back Precaution Comments: provided handout Restrictions Weight Bearing Restrictions: No      Mobility  Bed Mobility Overal bed mobility: Needs Assistance Bed Mobility: Rolling;Sidelying to Sit Rolling: Supervision Sidelying to sit: Supervision       General bed mobility comments: cues for technique  Transfers Overall transfer level: Needs assistance Equipment used: Rolling walker (2 wheeled) Transfers: Sit to/from Stand Sit to Stand: Supervision            Ambulation/Gait Ambulation/Gait assistance: Supervision Ambulation Distance (Feet): 200 Feet Assistive device: Rolling walker (2 wheeled) Gait Pattern/deviations: Step-through pattern        Stairs Stairs: Yes Stairs assistance: Supervision Stair Management: One rail Right;One rail Left;Step to pattern Number of Stairs: 2    Wheelchair Mobility    Modified Rankin (Stroke Patients Only)       Balance                                             Pertinent Vitals/Pain Pain Assessment: 0-10 Pain Score: 2  Pain Location: back Pain Descriptors / Indicators: Sore Pain Intervention(s): Monitored during session;Premedicated before session    Home Living Family/patient expects to be discharged to:: Private residence Living Arrangements: Spouse/significant other Available Help at Discharge: Family           Home Equipment: Grab bars -  tub/shower;Shower seat;Bedside commode      Prior Function Level of Independence: Independent               Hand Dominance   Dominant Hand: Right    Extremity/Trunk Assessment   Upper Extremity Assessment: Defer to OT evaluation           Lower Extremity Assessment: Overall WFL for tasks assessed         Communication   Communication: No difficulties  Cognition Arousal/Alertness: Awake/alert Behavior During Therapy: WFL for tasks assessed/performed Overall Cognitive Status: Within Functional Limits for tasks assessed                      General Comments      Exercises     Assessment/Plan    PT Assessment Patent does not need any further PT services  PT Problem List            PT Treatment Interventions      PT Goals (Current goals can be found in the Care Plan section)  Acute Rehab PT Goals Patient Stated Goal: back to being independent with everything PT Goal Formulation: All assessment and education complete, DC therapy    Frequency     Barriers to discharge        Co-evaluation               End of Session   Activity Tolerance: Patient tolerated treatment well Patient left: in chair;with call  bell/phone within reach Nurse Communication: Mobility status    Functional Assessment Tool Used: clinical judgement Functional Limitation: Mobility: Walking and moving around Mobility: Walking and Moving Around Current Status [phone]): At least 1 percent but less than 20 percent impaired, limited or restricted Mobility: Walking and Moving Around Goal Status [phone]): At least 1 percent but less than 20 percent impaired, limited or restricted Mobility: Walking and Moving Around Discharge Status [phone]): At least 1 percent but less than 20 percent impaired, limited or restricted    Time: 1013-1035 PT Time Calculation (min) (ACUTE ONLY): 22 min   Charges:         PT G Codes:   PT G-Codes **NOT FOR INPATIENT CLASS** Functional  Assessment Tool Used: clinical judgement Functional Limitation: Mobility: Walking and moving around Mobility: Walking and Moving Around Current Status [id number]): At least 1 percent but less than 20 percent impaired, limited or restricted Mobility: Walking and Moving Around Goal Status [phone]): At least 1 percent but less than 20 percent impaired, limited or restricted Mobility: Walking and Moving Around Discharge Status [phone]): At least 1 percent but less than 20 percent impaired, limited or restricted    Claretha Cooper 02/11/2016, 1:05 PM Tresa Endo PT [phone]

## 2016-02-11 NOTE — Evaluation (Signed)
Occupational Therapy Evaluation Patient Details Name: [name] MRN: [id number] DOB: 1945-12-18 Today's Date: 02/11/2016    History of Present Illness s/p L3-4 and L4-5 decompression and excision of large synovial cyst at L4-5   Clinical Impression   This 70 year old female was admitted for the above sx. All education was completed.  No further OT is needed at this time    Follow Up Recommendations  No OT follow up    Equipment Recommendations  None recommended by OT    Recommendations for Other Services       Precautions / Restrictions Precautions Precautions: Back Restrictions Weight Bearing Restrictions: No      Mobility Bed Mobility               General bed mobility comments: oob  Transfers Overall transfer level: Needs assistance Equipment used: Rolling walker (2 wheeled) Transfers: Sit to/from Stand Sit to Stand: supervision              Balance                                            ADL Overall ADL's : Needs assistance/impaired                                       General ADL Comments: pt is able to cross legs for ADLs.  Educated on back precautions with adls.  Pt verbalizes understanding of all.  Educated on toilet aide, in case she needs this.  Pt can perform UB adl with set up and  LB adl with set up, supervision     Vision     Perception     Praxis      Pertinent Vitals/Pain Pain Assessment: 0-10 Pain Score: 5  Pain Location: back Pain Descriptors / Indicators: Sore Pain Intervention(s): Limited activity within patient's tolerance;Monitored during session;Repositioned     Hand Dominance Right   Extremity/Trunk Assessment Upper Extremity Assessment Upper Extremity Assessment: Overall WFL for tasks assessed (RA changes in hands)           Communication Communication Communication: No difficulties   Cognition Arousal/Alertness: Awake/alert Behavior During Therapy: WFL  for tasks assessed/performed Overall Cognitive Status: Within Functional Limits for tasks assessed                     General Comments       Exercises       Shoulder Instructions      Home Living Family/patient expects to be discharged to:: Private residence Living Arrangements: Spouse/significant other Available Help at Discharge: Family               Bathroom Shower/Tub: Teaching laboratory technician Toilet: Handicapped height     Home Equipment: Grab bars - tub/shower;Shower seat;Bedside commode          Prior Functioning/Environment Level of Independence: Independent                 OT Problem List:     OT Treatment/Interventions:      OT Goals(Current goals can be found in the care plan section) Acute Rehab OT Goals Patient Stated Goal: back to being independent with everything OT Goal Formulation: All assessment and education complete, DC therapy  OT Frequency:  Barriers to D/C:            Co-evaluation              End of Session    Activity Tolerance: Patient tolerated treatment well Patient left: in chair;with call bell/phone within reach   Time: 1055-1110 OT Time Calculation (min): 15 min Charges:  OT General Charges $OT Visit: 1 Procedure OT Evaluation $OT Eval Low Complexity: 1 Procedure G-Codes: OT G-codes **NOT FOR INPATIENT CLASS** Functional Assessment Tool Used: clinical judgment Functional Limitation: Self care Self Care Current Status [id number]): At least 1 percent but less than 20 percent impaired, limited or restricted Self Care Goal Status [id number]): At least 1 percent but less than 20 percent impaired, limited or restricted Self Care Discharge Status [phone]): At least 1 percent but less than 20 percent impaired, limited or restricted  Makayla Lanter 02/11/2016, 12:05 PM  Lesle Chris, OTR/L [phone] 02/11/2016

## 2016-02-11 NOTE — Discharge Summary (Signed)
Physician Discharge Summary   Patient ID: [name] MRN: [id number] DOB/AGE: 1945/04/23 70 y.o.  Admit date: 02/10/2016 Discharge date: 02/11/2016  Primary Diagnosis:   Spinal Stenosis L4-L5  Admission Diagnoses:  Past Medical History:  Diagnosis Date  . Actinic keratosis   . Anemia    hx of  . Anemia of chronic renal failure, stage 3 (moderate) 07/17/2015  . Anticoagulant causing adverse effect in therapeutic use 07/17/2015  . Anxiety   . Atrial fibrillation ([place])   . Atrial fibrillation ([place])   . Carpal tunnel syndrome   . Chest pain   . Depression   . Edema   . Gastroesophageal reflux disease   . Hepatitis 1970  . Hiatal hernia   . Hyperlipidemia   . Hypertension   . Iron deficiency anemia due to chronic blood loss 07/17/2015  . Jaundice    age 69  . Memory loss   . Peripheral vascular disease (HCC)    DVT left lower leg  . Rheumatoid arthritis(714.0)   . Thyroid disease    multi nodular goiter  . Type II diabetes mellitus ([place])    Discharge Diagnoses:   Principal Problem:   Spinal stenosis of lumbar region  Procedure:  Procedure(s) (LRB): MICROLUMBAR DECOMPRESSION L4-L5 AND L3- L4, AND EXCISION OF SYNOVIAL CYST L4-L5 (N/A)   Consults: None  HPI:  see H&P    Laboratory Data: Hospital Outpatient Visit on 02/03/2016  Component Date Value Ref Range Status  . Glucose-Capillary 02/03/2016 145* 65 - 99 mg/dL Final  . Hgb A1c MFr Bld 02/04/2016 6.0* 4.8 - 5.6 % Final   Comment: (NOTE)         Pre-diabetes: 5.7 - 6.4         Diabetes: >6.4         Glycemic control for adults with diabetes: <7.0   . Mean Plasma Glucose 02/04/2016 126  mg/dL Final   Comment: (NOTE) Performed At: [facility] [place], [place] [id number] Lindon Romp MD JQ:[id number]   . Prothrombin Time 02/03/2016 22.8* 11.4 - 15.2 seconds Final  . INR 02/03/2016 1.98   Final  . aPTT 02/03/2016 34  24 - 36 seconds Final  . Sodium 02/03/2016 141  135 - 145  mmol/L Final  . Potassium 02/03/2016 3.9  3.5 - 5.1 mmol/L Final  . Chloride 02/03/2016 106  101 - 111 mmol/L Final  . CO2 02/03/2016 26  22 - 32 mmol/L Final  . Glucose, Bld 02/03/2016 122* 65 - 99 mg/dL Final  . BUN 02/03/2016 18  6 - 20 mg/dL Final  . Creatinine, Ser 02/03/2016 0.53  0.44 - 1.00 mg/dL Final  . Calcium 02/03/2016 9.2  8.9 - 10.3 mg/dL Final  . GFR calc non Af Amer 02/03/2016 >60  >60 mL/min Final  . GFR calc Af Amer 02/03/2016 >60  >60 mL/min Final   Comment: (NOTE) The eGFR has been calculated using the CKD EPI equation. This calculation has not been validated in all clinical situations. eGFR's persistently <60 mL/min signify possible Chronic Kidney Disease.   . Anion gap 02/03/2016 9  5 - 15 Final  . WBC 02/03/2016 9.8  4.0 - 10.5 K/uL Final  . RBC 02/03/2016 4.33  3.87 - 5.11 MIL/uL Final  . Hemoglobin 02/03/2016 9.5* 12.0 - 15.0 g/dL Final  . HCT 02/03/2016 33.5* 36.0 - 46.0 % Final  . MCV 02/03/2016 77.4* 78.0 - 100.0 fL Final  . MCH 02/03/2016 21.9* 26.0 - 34.0 pg Final  .  MCHC 02/03/2016 28.4* 30.0 - 36.0 g/dL Final  . RDW 02/03/2016 18.4* 11.5 - 15.5 % Final  . Platelets 02/03/2016 296  150 - 400 K/uL Final  . ABO/RH(D) 02/10/2016 O NEG   Final  . Antibody Screen 02/10/2016 NEG   Final  . Sample Expiration 02/10/2016 02/13/2016   Final  . Extend sample reason 02/10/2016 NO TRANSFUSIONS OR PREGNANCY IN THE PAST 3 MONTHS   Final  . MRSA, PCR 02/03/2016 NEGATIVE  NEGATIVE Final  . Staphylococcus aureus 02/03/2016 NEGATIVE  NEGATIVE Final   Comment:        The Xpert SA Assay (FDA approved for NASAL specimens in patients over 61 years of age), is one component of a comprehensive surveillance program.  Test performance has been validated by [facility] for patients greater than or equal to 20 year old. It is not intended to diagnose infection nor to guide or monitor treatment.    No results for input(s): HGB in the last 72 hours. No results for  input(s): WBC, RBC, HCT, PLT in the last 72 hours.  Recent Labs  02/10/16 2243 02/11/16 0413  NA 136 139  K 3.9 4.3  CL 102 104  CO2 25 25  BUN 18 17  CREATININE 0.75 0.67  GLUCOSE 427* 280*  CALCIUM 8.6* 8.9    Recent Labs  02/10/16 0854  INR 1.12    X-Rays:Dg Lumbar Spine 2-3 Views  Result Date: 02/03/2016 CLINICAL DATA:  Lumbago with bilateral radicular symptoms EXAM: LUMBAR SPINE - 2-3 VIEW COMPARISON:  None. FINDINGS: Upright frontal and upright lateral views were obtained. There are 5 non-rib-bearing lumbar type vertebral bodies. T12 ribs are hypoplastic. There is thoracolumbar dextroscoliosis. There is no fracture. There is 4 mm of anterolisthesis of L4 on L5. No other spondylolisthesis is evident. There is moderate disc space narrowing at L3-4, L4-5, and L5-S1. No erosive change. Patient is status post total hip replacements bilaterally. There is atherosclerotic calcification in the aorta and common iliac arteries. IMPRESSION: Scoliosis. Disc space narrowing at L3-4, L4-5, and L5-S1. Mild spondylolisthesis at L4-5 is felt to be due to underlying spondylosis. No fracture. There is aortic and iliac atherosclerosis. Electronically Signed   By: Lowella Grip III M.D.   On: 02/03/2016 10:53   Dg Spine Portable 1 View  Result Date: 02/10/2016 CLINICAL DATA:  Spinal stenosis. EXAM: PORTABLE SPINE - 1 VIEW 12:33 p.m. COMPARISON:  Radiograph dated 02/10/2016 at 11:44 a.m. FINDINGS: Instruments are present at the L3-4 level and  at the L4-5 level. IMPRESSION: Instruments at L3-4 and L4-5. Electronically Signed   By: Lorriane Shire M.D.   On: 02/10/2016 12:57   Dg Spine Portable 1 View  Result Date: 02/10/2016 CLINICAL DATA:  Intraoperative film #2, L3-4 and L4-5 lumbar decompression EXAM: PORTABLE SPINE - 1 VIEW COMPARISON:  Lumbar spine radiographs dated 02/03/2016 FINDINGS: Surgical probes posterior to the L3 and L5 vertebral bodies. Surgical hardware at L4-5. IMPRESSION: Lumbar  localization as above. Electronically Signed   By: Julian Hy M.D.   On: 02/10/2016 12:13   Dg Spine Portable 1 View  Result Date: 02/10/2016 CLINICAL DATA:  Intraoperative lumbar spine film EXAM: PORTABLE SPINE - 1 VIEW COMPARISON:  Lumbar spine radiographs dated 02/03/2016 FINDINGS: Surgical probes at L3-4 and L4-5. IMPRESSION: Lumbar localization, as above. Electronically Signed   By: Julian Hy M.D.   On: 02/10/2016 12:12    EKG: Orders placed or performed during the hospital encounter of 08/04/15  . EKG 12-Lead  .  EKG 12-Lead  . EKG 12-Lead  . EKG 12-Lead  . EKG     Hospital Course: Patient was admitted to [facility] and taken to the OR and underwent the above state procedure without complications.  Patient tolerated the procedure well and was later transferred to the recovery room and then to the orthopaedic floor for postoperative care.  They were given PO and IV analgesics for pain control following their surgery.  They were given 24 hours of postoperative antibiotics.   PT was consulted postop to assist with mobility and transfers.  The patient was allowed to be WBAT with therapy and was taught back precautions. Discharge planning was consulted to help with postop disposition and equipment needs.  Patient had a good night on the evening of surgery and started to get up OOB with therapy on day one. Patient was seen in rounds and was ready to go home on day one.  They were given discharge instructions and dressing directions.  They were instructed on when to follow up in the office with Dr. Tonita Cong.   Diet: Diabetic diet Activity:WBAT; Lspine precautions Follow-up:in 10-14 days Disposition - Home Discharged Condition: good   Discharge Instructions    Call MD / Call 911    Complete by:  As directed    If you experience chest pain or shortness of breath, CALL 911 and be transported to the hospital emergency room.  If you develope a fever above 101 F, pus (white  drainage) or increased drainage or redness at the wound, or calf pain, call your surgeon's office.   Constipation Prevention    Complete by:  As directed    Drink plenty of fluids.  Prune juice may be helpful.  You may use a stool softener, such as Colace (over the counter) 100 mg twice a day.  Use MiraLax (over the counter) for constipation as needed.   Diet - low sodium heart healthy    Complete by:  As directed    Increase activity slowly as tolerated    Complete by:  As directed        Medication List    STOP taking these medications   HYDROcodone-acetaminophen 5-325 MG tablet Commonly known as:  NORCO/VICODIN     TAKE these medications   buPROPion 150 MG 12 hr tablet Commonly known as:  WELLBUTRIN SR Take 150 mg by mouth 2 (two) times daily.   diltiazem 240 MG 24 hr capsule Commonly known as:  CARDIZEM CD Take 240 mg by mouth daily.   docusate sodium 100 MG capsule Commonly known as:  COLACE Take 1 capsule (100 mg total) by mouth 2 (two) times daily as needed for mild constipation.   esomeprazole 40 MG capsule Commonly known as:  NEXIUM Take 40 mg by mouth daily.   ferrous sulfate 325 (65 FE) MG EC tablet Take 325 mg by mouth every other day.   fluticasone 50 MCG/ACT nasal spray Commonly known as:  FLONASE Place 1-2 sprays into both nostrils daily as needed for allergies.   furosemide 40 MG tablet Commonly known as:  LASIX TAKE 1 TABLET (40 MG TOTAL) BY MOUTH DAILY.   gabapentin 300 MG capsule Commonly known as:  NEURONTIN Take 300 mg by mouth daily.   glimepiride 2 MG tablet Commonly known as:  AMARYL Take 2 mg by mouth every morning.   leflunomide 20 MG tablet Commonly known as:  ARAVA Take 20 mg by mouth daily.   metFORMIN 500 MG 24 hr tablet  Commonly known as:  GLUCOPHAGE-XR Take 1,000 mg by mouth 2 (two) times daily.   metoprolol 50 MG tablet Commonly known as:  LOPRESSOR Take 1.5 tablets (75 mg total) by mouth 2 (two) times daily.     naproxen sodium 220 MG tablet Commonly known as:  ANAPROX Take 2 tablets (440 mg total) by mouth 2 (two) times daily as needed (pain). Resume 5 days post-op as needed What changed:  additional instructions   nitroGLYCERIN 0.4 MG SL tablet Commonly known as:  NITROSTAT Place 0.4 mg under the tongue every 5 (five) minutes as needed for chest pain (x 3 doses).   Omega 3 1200 MG Caps Take 1 capsule (1,200 mg total) by mouth daily. May resume 5 days post-op What changed:  additional instructions   ORENCIA 125 MG/ML Sosy Generic drug:  Abatacept Inject 125 mg into the skin every 7 (seven) days. Either Wed or Thurs.   oxyCODONE-acetaminophen 5-325 MG tablet Commonly known as:  PERCOCET Take 1 tablet by mouth every 4 (four) hours as needed for severe pain.   polyethylene glycol packet Commonly known as:  MIRALAX / GLYCOLAX Take 17 g by mouth daily.   potassium chloride 10 MEQ tablet Commonly known as:  KLOR-CON 10 Take 2 tablets (20 mEq total) by mouth 2 (two) times daily.   predniSONE 5 MG tablet Commonly known as:  DELTASONE Take 5 mg by mouth daily.   PRESERVISION AREDS Caps Take 1 capsule by mouth 2 (two) times daily.   rivaroxaban 20 MG Tabs tablet Commonly known as:  XARELTO Take 1 tablet (20 mg total) by mouth daily with supper.   rosuvastatin 10 MG tablet Commonly known as:  CRESTOR Take 10 mg by mouth every evening.   VITAMIN B 12 PO Take 1,000 mcg by mouth daily.   Vitamin D3 1000 units Caps Take 1,000 Units by mouth daily.      RESUME XARELTO 5 DAYS POST-OP  Follow-up Information    BEANE,JEFFREY C, MD Follow up in 2 week(s).   Specialty:  Orthopedic Surgery Contact information: [address] [place] [address] [phone]           Signed: Lacie Draft, PA-C Orthopaedic Surgery 02/11/2016, 10:43 AM

## 2016-02-23 DIAGNOSIS — Z9889 Other specified postprocedural states: Secondary | ICD-10-CM | POA: Diagnosis not present

## 2016-02-23 DIAGNOSIS — Z4789 Encounter for other orthopedic aftercare: Secondary | ICD-10-CM | POA: Diagnosis not present

## 2016-03-08 ENCOUNTER — Other Ambulatory Visit: Payer: Self-pay | Admitting: Family Medicine

## 2016-03-08 DIAGNOSIS — Z1231 Encounter for screening mammogram for malignant neoplasm of breast: Secondary | ICD-10-CM

## 2016-03-08 DIAGNOSIS — Z23 Encounter for immunization: Secondary | ICD-10-CM | POA: Diagnosis not present

## 2016-03-08 DIAGNOSIS — F329 Major depressive disorder, single episode, unspecified: Secondary | ICD-10-CM | POA: Diagnosis not present

## 2016-03-08 DIAGNOSIS — Z09 Encounter for follow-up examination after completed treatment for conditions other than malignant neoplasm: Secondary | ICD-10-CM | POA: Diagnosis not present

## 2016-03-09 ENCOUNTER — Encounter: Payer: Self-pay | Admitting: Cardiology

## 2016-03-09 NOTE — Progress Notes (Signed)
HPI: FU Atrial fibrillation. Echocardiogram July 2016 showed normal LV systolic function. Nuclear study December 2016 showed normal perfusion. Not gated because of atrial fibrillation. Patient had digoxin discontinued and metoprolol decreased previously because of bradycardia. Since she was last seen she notes increased dyspnea on exertion and occasional orthopnea. Minimal chronic pedal edema. No chest pain, palpitations or syncope.  Current Outpatient Prescriptions  Medication Sig Dispense Refill  . buPROPion (WELLBUTRIN SR) 150 MG 12 hr tablet Take 150 mg by mouth 2 (two) times daily.      . Cholecalciferol (VITAMIN D3) 1000 units CAPS Take 1,000 Units by mouth daily.    . Cyanocobalamin (VITAMIN B 12 PO) Take 1,000 mcg by mouth daily.     [name] diltiazem (CARDIZEM CD) 240 MG 24 hr capsule Take 240 mg by mouth daily.    [name] esomeprazole (NEXIUM) 40 MG capsule Take 40 mg by mouth daily.      . ferrous sulfate 325 (65 FE) MG EC tablet Take 325 mg by mouth every other day.    . fluticasone (FLONASE) 50 MCG/ACT nasal spray Place 1-2 sprays into both nostrils daily as needed for allergies.     . furosemide (LASIX) 40 MG tablet TAKE 1 TABLET (40 MG TOTAL) BY MOUTH DAILY. 30 tablet 2  . glimepiride (AMARYL) 2 MG tablet Take 2 mg by mouth every morning.  3  . leflunomide (ARAVA) 20 MG tablet Take 20 mg by mouth daily.     . metFORMIN (GLUCOPHAGE-XR) 500 MG 24 hr tablet Take 1,000 mg by mouth 2 (two) times daily.  1  . metoprolol (LOPRESSOR) 50 MG tablet Take 1.5 tablets (75 mg total) by mouth 2 (two) times daily. 270 tablet 1  . Multiple Vitamins-Minerals (PRESERVISION AREDS) CAPS Take 1 capsule by mouth 2 (two) times daily.    . naproxen sodium (ANAPROX) 220 MG tablet Take 2 tablets (440 mg total) by mouth 2 (two) times daily as needed (pain). Resume 5 days post-op as needed    . nitroGLYCERIN (NITROSTAT) 0.4 MG SL tablet Place 0.4 mg under the tongue every 5 (five) minutes as needed for chest pain  (x 3 doses).     . Omega 3 1200 MG CAPS Take 1 capsule (1,200 mg total) by mouth daily. May resume 5 days post-op    . ORENCIA 125 MG/ML SOSY Inject 125 mg into the skin every 7 (seven) days. Either Wed or Thurs.    . potassium chloride (KLOR-CON 10) 10 MEQ tablet Take 2 tablets (20 mEq total) by mouth 2 (two) times daily. 120 tablet 10  . predniSONE (DELTASONE) 5 MG tablet Take 5 mg by mouth daily.    . rivaroxaban (XARELTO) 20 MG TABS tablet Take 1 tablet (20 mg total) by mouth daily with supper. Resume 5 days post-op 30 tablet 3  . rosuvastatin (CRESTOR) 10 MG tablet Take 10 mg by mouth every evening.     No current facility-administered medications for this visit.      Past Medical History:  Diagnosis Date  . Actinic keratosis   . Anemia    hx of  . Anemia of chronic renal failure, stage 3 (moderate) 07/17/2015  . Anticoagulant causing adverse effect in therapeutic use 07/17/2015  . Anxiety   . Atrial fibrillation ([place])   . Atrial fibrillation ([place])   . Carpal tunnel syndrome   . Chest pain   . Depression   . Edema   . Gastroesophageal reflux disease   .  Hepatitis 1970  . Hiatal hernia   . Hyperlipidemia   . Hypertension   . Iron deficiency anemia due to chronic blood loss 07/17/2015  . Jaundice    age 70  . Memory loss   . Peripheral vascular disease (HCC)    DVT left lower leg  . Rheumatoid arthritis(714.0)   . Thyroid disease    multi nodular goiter  . Type II diabetes mellitus ([place])     Past Surgical History:  Procedure Laterality Date  . CATARACT EXTRACTION Left   . COLONOSCOPY WITH PROPOFOL N/A 02/20/2015   Procedure: COLONOSCOPY WITH PROPOFOL;  Surgeon: Carol Ada, MD;  Location: [facility] ENDOSCOPY;  Service: Endoscopy;  Laterality: N/A;  . ESOPHAGOGASTRODUODENOSCOPY (EGD) WITH PROPOFOL N/A 02/20/2015   Procedure: ESOPHAGOGASTRODUODENOSCOPY (EGD) WITH PROPOFOL;  Surgeon: Carol Ada, MD;  Location: [facility] ENDOS[facility];  Service: Endoscopy;  Laterality: N/A;  . HAND  SURGERY  1995 and 1996   artificial joints both hands  . JOINT REPLACEMENT    . LUMBAR LAMINECTOMY/DECOMPRESSION MICRODISCECTOMY N/A 02/10/2016   Procedure: MICROLUMBAR DECOMPRESSION L4-L5 AND L3- L4, AND EXCISION OF SYNOVIAL CYST L4-L5;  Surgeon: Susa Day, MD;  Location: [facility] ORS;  Service: Orthopedics;  Laterality: N/A;  . TOTAL HIP ARTHROPLASTY Left 2009  . TOTAL HIP ARTHROPLASTY Right 04/03/2013   Procedure: RIGHT TOTAL HIP ARTHROPLASTY ANTERIOR APPROACH;  Surgeon: Gearlean Alf, MD;  Location: [facility] ORS;  Service: Orthopedics;  Laterality: Right;    Social History   Social History  . Marital status: Divorced    Spouse name: N/A  . Number of children: N/A  . Years of education: N/A   Occupational History  . Retired    Social History Main Topics  . Smoking status: Never Smoker  . Smokeless tobacco: Never Used  . Alcohol use No  . Drug use: No  . Sexual activity: Not Currently   Other Topics Concern  . Not on file   Social History Narrative  . No narrative on file    Family History  Problem Relation Age of Onset  . Stroke Father   . Heart failure Sister   . Hypertension Sister   . Heart attack Neg Hx     ROS: no fevers or chills, productive cough, hemoptysis, dysphasia, odynophagia, melena, hematochezia, dysuria, hematuria, rash, seizure activity, orthopnea, PND, claudication. Remaining systems are negative.  Physical Exam: Well-developed well-nourished in no acute distress.  Skin is warm and dry.  HEENT is normal.  Neck is supple.  Chest is clear to auscultation with normal expansion.  Cardiovascular exam is irregular and tachycardic Abdominal exam nontender or distended. No masses palpated. Extremities show trace edema. neuro grossly intact  ECG-Atrial fibrillation at a rate of 107. Nonspecific ST changes.  A/P  1 Hypertension-blood pressure controlled. Continue present medications.  2 permanent atrial fibrillation-patient is describing increased  dyspnea and orthopnea. This may be secondary to atrial fibrillation with elevated rate. She will take an additional 20 mg of Lasix today, 60 mg the following 2 days and then resume 40 mg daily. In 1 week check potassium, renal function and BNP. Continue Cardizem and metoprolol for rate control. Check 24-hour Holter monitor to make sure that rate is controlled. Note digoxin was discontinued and metoprolol decreased in the past because of bradycardia. She may now be tachycardic and need further adjustment of her rate controlling medications. Repeat echocardiogram. Continue xarelto. I will have her return in 2 weeks to see one of our assistants and then see me in 3 months.  3 lower extremity edema-felt secondary to previous DVT. Diuretics as outlined above.  4 Lower extremity venous thrombosis-continue anticoagulation.  Kirk Ruths, MD

## 2016-03-14 ENCOUNTER — Encounter: Payer: Self-pay | Admitting: Cardiology

## 2016-03-14 ENCOUNTER — Ambulatory Visit (INDEPENDENT_AMBULATORY_CARE_PROVIDER_SITE_OTHER): Payer: Medicare Other | Admitting: Cardiology

## 2016-03-14 VITALS — BP 112/78 | HR 107 | Ht 66.0 in

## 2016-03-14 DIAGNOSIS — I482 Chronic atrial fibrillation: Secondary | ICD-10-CM | POA: Diagnosis not present

## 2016-03-14 DIAGNOSIS — I10 Essential (primary) hypertension: Secondary | ICD-10-CM | POA: Diagnosis not present

## 2016-03-14 DIAGNOSIS — R0602 Shortness of breath: Secondary | ICD-10-CM

## 2016-03-14 DIAGNOSIS — I4821 Permanent atrial fibrillation: Secondary | ICD-10-CM

## 2016-03-14 NOTE — Patient Instructions (Signed)
Medication Instructions:   TAKE 20 MG OF FUROSEMIDE TODAY  TAKE 60 MG OF FUROSEMIDE Tuesday AND Wednesday AND THEN DECREASE BACK TO 40 MG ONCE DAILY  Labwork:  Your physician recommends that you return for lab work in: [place]  Testing/Procedures:  Your physician has requested that you have an echocardiogram. Echocardiography is a painless test that uses sound waves to create images of your heart. It provides your doctor with information about the size and shape of your heart and how well your heart's chambers and valves are working. This procedure takes approximately one hour. There are no restrictions for this procedure.   Your physician has recommended that you wear a 24 HOUR holter monitor. Holter monitors are medical devices that record the heart's electrical activity. Doctors most often use these monitors to diagnose arrhythmias. Arrhythmias are problems with the speed or rhythm of the heartbeat. The monitor is a small, portable device. You can wear one while you do your normal daily activities. This is usually used to diagnose what is causing palpitations/syncope (passing out).    Follow-Up:  Your physician recommends that you schedule a follow-up appointment in: [place] physician recommends that you schedule a follow-up appointment in: [place]

## 2016-03-15 ENCOUNTER — Other Ambulatory Visit: Payer: Self-pay

## 2016-03-15 MED ORDER — DILTIAZEM HCL ER COATED BEADS 240 MG PO CP24: 240.0000 mg | ORAL_CAPSULE | Freq: Every day | ORAL | 1 refills | Status: DC

## 2016-03-18 ENCOUNTER — Telehealth: Payer: Self-pay | Admitting: Cardiology

## 2016-03-18 NOTE — Telephone Encounter (Signed)
New Message  Pt voiced during her OV she was diagnosis with sob & atril fib.  Pt voiced yesterday she had problems with sob and pt wants to know if it happens again what should she do?  Please f/u with pt

## 2016-03-18 NOTE — Telephone Encounter (Signed)
Spoke to patient. We discussed symptoms she had which she saw Dr. Stanford Breed for on Monday. She notes no acute symptoms at this time. She reports improvement of LE edema w dose increase of lasix. Recommended f/u in 2 weeks, but if she has new acute symptoms or changes to call. O/w keep on schedule for f/u and recommended testing. Pt voiced understanding and thanks.

## 2016-03-19 ENCOUNTER — Encounter (HOSPITAL_COMMUNITY): Payer: Self-pay | Admitting: Emergency Medicine

## 2016-03-19 ENCOUNTER — Inpatient Hospital Stay (HOSPITAL_COMMUNITY)
Admission: EM | Admit: 2016-03-19 | Discharge: 2016-03-23 | DRG: 378 | Disposition: A | Discharge status: Home / Self Care | Payer: Medicare Other | Attending: Family Medicine | Admitting: Family Medicine

## 2016-03-19 ENCOUNTER — Emergency Department (HOSPITAL_COMMUNITY): Payer: Medicare Other

## 2016-03-19 DIAGNOSIS — I482 Chronic atrial fibrillation: Secondary | ICD-10-CM | POA: Diagnosis not present

## 2016-03-19 DIAGNOSIS — N183 Chronic kidney disease, stage 3 unspecified: Secondary | ICD-10-CM | POA: Diagnosis present

## 2016-03-19 DIAGNOSIS — M069 Rheumatoid arthritis, unspecified: Secondary | ICD-10-CM | POA: Diagnosis present

## 2016-03-19 DIAGNOSIS — K219 Gastro-esophageal reflux disease without esophagitis: Secondary | ICD-10-CM | POA: Diagnosis present

## 2016-03-19 DIAGNOSIS — F419 Anxiety disorder, unspecified: Secondary | ICD-10-CM | POA: Diagnosis present

## 2016-03-19 DIAGNOSIS — K5521 Angiodysplasia of colon with hemorrhage: Secondary | ICD-10-CM | POA: Diagnosis not present

## 2016-03-19 DIAGNOSIS — E119 Type 2 diabetes mellitus without complications: Secondary | ICD-10-CM

## 2016-03-19 DIAGNOSIS — E11319 Type 2 diabetes mellitus with unspecified diabetic retinopathy without macular edema: Secondary | ICD-10-CM | POA: Diagnosis not present

## 2016-03-19 DIAGNOSIS — I82502 Chronic embolism and thrombosis of unspecified deep veins of left lower extremity: Secondary | ICD-10-CM | POA: Diagnosis not present

## 2016-03-19 DIAGNOSIS — D649 Anemia, unspecified: Secondary | ICD-10-CM | POA: Diagnosis not present

## 2016-03-19 DIAGNOSIS — Z79899 Other long term (current) drug therapy: Secondary | ICD-10-CM

## 2016-03-19 DIAGNOSIS — D500 Iron deficiency anemia secondary to blood loss (chronic): Secondary | ICD-10-CM | POA: Diagnosis present

## 2016-03-19 DIAGNOSIS — I82402 Acute embolism and thrombosis of unspecified deep veins of left lower extremity: Secondary | ICD-10-CM | POA: Diagnosis present

## 2016-03-19 DIAGNOSIS — I10 Essential (primary) hypertension: Secondary | ICD-10-CM | POA: Diagnosis not present

## 2016-03-19 DIAGNOSIS — E1122 Type 2 diabetes mellitus with diabetic chronic kidney disease: Secondary | ICD-10-CM | POA: Diagnosis present

## 2016-03-19 DIAGNOSIS — E7800 Pure hypercholesterolemia, unspecified: Secondary | ICD-10-CM | POA: Diagnosis present

## 2016-03-19 DIAGNOSIS — D631 Anemia in chronic kidney disease: Secondary | ICD-10-CM | POA: Diagnosis present

## 2016-03-19 DIAGNOSIS — Z7901 Long term (current) use of anticoagulants: Secondary | ICD-10-CM

## 2016-03-19 DIAGNOSIS — Z7984 Long term (current) use of oral hypoglycemic drugs: Secondary | ICD-10-CM

## 2016-03-19 DIAGNOSIS — E1151 Type 2 diabetes mellitus with diabetic peripheral angiopathy without gangrene: Secondary | ICD-10-CM | POA: Diagnosis not present

## 2016-03-19 DIAGNOSIS — K254 Chronic or unspecified gastric ulcer with hemorrhage: Secondary | ICD-10-CM | POA: Diagnosis not present

## 2016-03-19 DIAGNOSIS — I129 Hypertensive chronic kidney disease with stage 1 through stage 4 chronic kidney disease, or unspecified chronic kidney disease: Secondary | ICD-10-CM | POA: Diagnosis present

## 2016-03-19 DIAGNOSIS — E1169 Type 2 diabetes mellitus with other specified complication: Secondary | ICD-10-CM

## 2016-03-19 DIAGNOSIS — F329 Major depressive disorder, single episode, unspecified: Secondary | ICD-10-CM | POA: Diagnosis present

## 2016-03-19 DIAGNOSIS — E785 Hyperlipidemia, unspecified: Secondary | ICD-10-CM

## 2016-03-19 DIAGNOSIS — R0602 Shortness of breath: Secondary | ICD-10-CM | POA: Diagnosis not present

## 2016-03-19 DIAGNOSIS — Z96643 Presence of artificial hip joint, bilateral: Secondary | ICD-10-CM | POA: Diagnosis present

## 2016-03-19 LAB — CBC WITH DIFFERENTIAL/PLATELET
BASOS PCT: 0 %
Basophils Absolute: 0 10*3/uL (ref 0.0–0.1)
Eosinophils Absolute: 0.1 10*3/uL (ref 0.0–0.7)
Eosinophils Relative: 1 %
HEMATOCRIT: 26.8 % — AB (ref 36.0–46.0)
HEMOGLOBIN: 7.7 g/dL — AB (ref 12.0–15.0)
LYMPHS PCT: 12 %
Lymphs Abs: 1.3 10*3/uL (ref 0.7–4.0)
MCH: 20.6 pg — AB (ref 26.0–34.0)
MCHC: 28.7 g/dL — ABNORMAL LOW (ref 30.0–36.0)
MCV: 71.7 fL — AB (ref 78.0–100.0)
MONOS PCT: 8 %
Monocytes Absolute: 0.9 10*3/uL (ref 0.1–1.0)
NEUTROS ABS: 8.9 10*3/uL — AB (ref 1.7–7.7)
Neutrophils Relative %: 79 %
Platelets: 345 10*3/uL (ref 150–400)
RBC: 3.74 MIL/uL — ABNORMAL LOW (ref 3.87–5.11)
RDW: 19.5 % — ABNORMAL HIGH (ref 11.5–15.5)
WBC: 11.2 10*3/uL — ABNORMAL HIGH (ref 4.0–10.5)

## 2016-03-19 LAB — BASIC METABOLIC PANEL
Anion gap: 13 (ref 5–15)
BUN: 16 mg/dL (ref 6–20)
CHLORIDE: 106 mmol/L (ref 101–111)
CO2: 21 mmol/L — AB (ref 22–32)
Calcium: 8.8 mg/dL — ABNORMAL LOW (ref 8.9–10.3)
Creatinine, Ser: 0.8 mg/dL (ref 0.44–1.00)
GFR calc Af Amer: >60 mL/min (ref >60)
GFR calc non Af Amer: >60 mL/min (ref >60)
GLUCOSE: 67 mg/dL (ref 65–99)
POTASSIUM: 4 mmol/L (ref 3.5–5.1)
Sodium: 140 mmol/L (ref 135–145)

## 2016-03-19 LAB — D-DIMER, QUANTITATIVE (NOT AT ARMC): D DIMER QUANT: 0.51 ug{FEU}/mL — AB (ref 0.00–0.50)

## 2016-03-19 LAB — BRAIN NATRIURETIC PEPTIDE: B Natriuretic Peptide: 227.9 pg/mL — ABNORMAL HIGH (ref 0.0–100.0)

## 2016-03-19 LAB — PREPARE RBC (CROSSMATCH)

## 2016-03-19 LAB — GLUCOSE, CAPILLARY
GLUCOSE-CAPILLARY: 41 mg/dL — AB (ref 65–99)
GLUCOSE-CAPILLARY: 50 mg/dL — AB (ref 65–99)

## 2016-03-19 LAB — POC OCCULT BLOOD, ED: FECAL OCCULT BLD: POSITIVE — AB

## 2016-03-19 LAB — I-STAT TROPONIN, ED: TROPONIN I, POC: 0 ng/mL (ref 0.00–0.08)

## 2016-03-19 MED ORDER — FERROUS SULFATE 325 (65 FE) MG PO TABS
325.0000 mg | ORAL_TABLET | ORAL | Status: DC
  Administered 2016-03-20 – 2016-03-22 (×3): 325 mg via ORAL
  Filled 2016-03-19 (×3): qty 1

## 2016-03-19 MED ORDER — BUPROPION HCL ER (SR) 150 MG PO TB12
150.0000 mg | ORAL_TABLET | Freq: Two times a day (BID) | ORAL | Status: DC
  Administered 2016-03-19 – 2016-03-23 (×8): 150 mg via ORAL
  Filled 2016-03-19 (×8): qty 1

## 2016-03-19 MED ORDER — OMEGA-3-ACID ETHYL ESTERS 1 G PO CAPS
1.0000 g | ORAL_CAPSULE | Freq: Every day | ORAL | Status: DC
  Administered 2016-03-20 – 2016-03-22 (×3): 1 g via ORAL
  Filled 2016-03-19 (×3): qty 1

## 2016-03-19 MED ORDER — SODIUM CHLORIDE 0.9 % IV SOLN
INTRAVENOUS | Status: DC
  Administered 2016-03-19: via INTRAVENOUS

## 2016-03-19 MED ORDER — SODIUM CHLORIDE 0.9% FLUSH
3.0000 mL | Freq: Two times a day (BID) | INTRAVENOUS | Status: DC
  Administered 2016-03-20 – 2016-03-23 (×5): 3 mL via INTRAVENOUS

## 2016-03-19 MED ORDER — PREDNISONE 5 MG PO TABS
5.0000 mg | ORAL_TABLET | Freq: Every day | ORAL | Status: DC
  Administered 2016-03-20 – 2016-03-23 (×4): 5 mg via ORAL
  Filled 2016-03-19 (×4): qty 1

## 2016-03-19 MED ORDER — LEFLUNOMIDE 20 MG PO TABS
20.0000 mg | ORAL_TABLET | Freq: Every day | ORAL | Status: DC
  Administered 2016-03-20 – 2016-03-23 (×4): 20 mg via ORAL
  Filled 2016-03-19 (×4): qty 1

## 2016-03-19 MED ORDER — POTASSIUM CHLORIDE CRYS ER 10 MEQ PO TBCR
20.0000 meq | EXTENDED_RELEASE_TABLET | Freq: Two times a day (BID) | ORAL | Status: DC
  Administered 2016-03-19 – 2016-03-23 (×8): 20 meq via ORAL
  Filled 2016-03-19 (×14): qty 2

## 2016-03-19 MED ORDER — FUROSEMIDE 40 MG PO TABS
40.0000 mg | ORAL_TABLET | Freq: Every day | ORAL | Status: DC
Start: 2016-03-20 — End: 2016-03-23
  Administered 2016-03-20 – 2016-03-23 (×4): 40 mg via ORAL
  Filled 2016-03-19 (×4): qty 1

## 2016-03-19 MED ORDER — SODIUM CHLORIDE 0.9 % IV SOLN
10.0000 mL/h | Freq: Once | INTRAVENOUS | Status: AC
  Administered 2016-03-19: 10 mL/h via INTRAVENOUS

## 2016-03-19 MED ORDER — ONDANSETRON HCL 4 MG/2ML IJ SOLN: 4.0000 mg | Freq: Four times a day (QID) | INTRAMUSCULAR | Status: DC | PRN

## 2016-03-19 MED ORDER — PANTOPRAZOLE SODIUM 40 MG PO TBEC
80.0000 mg | DELAYED_RELEASE_TABLET | Freq: Every day | ORAL | Status: DC
  Administered 2016-03-20 – 2016-03-22 (×3): 80 mg via ORAL
  Filled 2016-03-19 (×3): qty 2

## 2016-03-19 MED ORDER — ROSUVASTATIN CALCIUM 10 MG PO TABS
10.0000 mg | ORAL_TABLET | Freq: Every day | ORAL | Status: DC
  Administered 2016-03-20 – 2016-03-22 (×3): 10 mg via ORAL
  Filled 2016-03-19 (×3): qty 1

## 2016-03-19 MED ORDER — ONDANSETRON HCL 4 MG PO TABS: 4.0000 mg | ORAL_TABLET | Freq: Four times a day (QID) | ORAL | Status: DC | PRN

## 2016-03-19 MED ORDER — IBUPROFEN 200 MG PO TABS: 400.0000 mg | ORAL_TABLET | Freq: Four times a day (QID) | ORAL | Status: DC | PRN

## 2016-03-19 MED ORDER — DILTIAZEM HCL ER COATED BEADS 240 MG PO CP24
240.0000 mg | ORAL_CAPSULE | Freq: Every day | ORAL | Status: DC
  Administered 2016-03-20 – 2016-03-23 (×4): 240 mg via ORAL
  Filled 2016-03-19 (×4): qty 1

## 2016-03-19 MED ORDER — METOPROLOL TARTRATE 25 MG PO TABS
50.0000 mg | ORAL_TABLET | Freq: Once | ORAL | Status: AC
  Administered 2016-03-19: 50 mg via ORAL
  Filled 2016-03-19: qty 2

## 2016-03-19 MED ORDER — METFORMIN HCL ER 500 MG PO TB24
1000.0000 mg | ORAL_TABLET | Freq: Two times a day (BID) | ORAL | Status: DC
  Administered 2016-03-20: 1000 mg via ORAL
  Filled 2016-03-19 (×2): qty 2

## 2016-03-19 MED ORDER — INSULIN ASPART 100 UNIT/ML ~~LOC~~ SOLN: 0.0000 [IU] | Freq: Three times a day (TID) | SUBCUTANEOUS | Status: DC

## 2016-03-19 MED ORDER — PROSIGHT PO TABS
ORAL_TABLET | Freq: Two times a day (BID) | ORAL | Status: DC
Start: 2016-03-19 — End: 2016-03-23
  Administered 2016-03-19 – 2016-03-22 (×7): 1 via ORAL
  Filled 2016-03-19 (×7): qty 1

## 2016-03-19 MED ORDER — RIVAROXABAN 20 MG PO TABS
20.0000 mg | ORAL_TABLET | Freq: Every day | ORAL | Status: DC
  Administered 2016-03-20 – 2016-03-22 (×3): 20 mg via ORAL
  Filled 2016-03-19 (×3): qty 1

## 2016-03-19 MED ORDER — METOPROLOL TARTRATE 50 MG PO TABS
75.0000 mg | ORAL_TABLET | Freq: Two times a day (BID) | ORAL | Status: DC
  Administered 2016-03-19 – 2016-03-23 (×8): 75 mg via ORAL
  Filled 2016-03-19 (×8): qty 1

## 2016-03-19 MED ORDER — VITAMIN D 1000 UNITS PO TABS
1000.0000 [IU] | ORAL_TABLET | Freq: Every day | ORAL | Status: DC
  Administered 2016-03-20 – 2016-03-22 (×3): 1000 [IU] via ORAL
  Filled 2016-03-19 (×3): qty 1

## 2016-03-19 MED ORDER — INSULIN ASPART 100 UNIT/ML ~~LOC~~ SOLN: 0.0000 [IU] | Freq: Every day | SUBCUTANEOUS | Status: DC

## 2016-03-19 NOTE — ED Provider Notes (Signed)
[facility] DEPT Provider Note   CSN: [id number] Arrival date & time: 03/19/16  1456     History   Chief Complaint Chief Complaint  Patient presents with  . Shortness of Breath    HPI [name] is a 70 y.o. female.  HPI [name] is a 70 y.o. female with history of atrial fibrillation, edema, peripheral vascular disease, DVT, rheumatoid arthritis, diabetes, hepatitis, presents to emergency department complaining of shortness of breath. Patient states her symptoms started 2 weeks ago. She reports shortness of breath that is only on exertion. She states she is unable to walk from one side to the other side of her trailer without getting short of breath. She denies any associated chest pain. She reports some dizziness. No nausea or vomiting. States has had A. fib for 5 years but never had similar symptoms. She saw her cardiologist 5 days ago who told her if her symptoms persisted to go to the ED. Pt with diagnosis of Left leg DVT a year ago, states persistent swelling in that leg that improves with lasix. States on Xarelto and has not missed any doses. Reports that her lasix was increased 5 days ago to 60mg  from 40 due to leg swelling, and states swelling has improved. Pt reports back surgery 1 month ago for "pinched nerve" in lumbar spine. No problems.   Past Medical History:  Diagnosis Date  . Actinic keratosis   . Anemia    hx of  . Anemia of chronic renal failure, stage 3 (moderate) 07/17/2015  . Anticoagulant causing adverse effect in therapeutic use 07/17/2015  . Anxiety   . Atrial fibrillation ([name])   . Atrial fibrillation ([place])   . Carpal tunnel syndrome   . Chest pain   . Depression   . Edema   . Gastroesophageal reflux disease   . Hepatitis 1970  . Hiatal hernia   . Hyperlipidemia   . Hypertension   . Iron deficiency anemia due to chronic blood loss 07/17/2015  . Jaundice    age 53  . Memory loss   . Peripheral vascular disease (HCC)    DVT left lower leg   . Rheumatoid arthritis(714.0)   . Thyroid disease    multi nodular goiter  . Type II diabetes mellitus [facility])     Patient Active Problem List   Diagnosis Date Noted  . Spinal stenosis of lumbar region 02/10/2016  . Swelling of limb 01/13/2016  . Varicose veins of left leg with edema 09/15/2015  . Preop cardiovascular exam 08/25/2015  . Iron deficiency anemia due to chronic blood loss 07/17/2015  . Anemia of chronic renal failure, stage 3 (moderate) 07/17/2015  . Anticoagulant causing adverse effect in therapeutic use 07/17/2015  . Symptomatic bradycardia 02/21/2015  . Bradycardia   . A-fib ([place]) 10/09/2014  . Acute deep vein thrombosis (DVT) of left lower extremity ([place]) 05/06/2014  . Abdominal wall mass of right lower quadrant 07/08/2013  . OA (osteoarthritis) of hip 04/03/2013  . Osteoarthritis of hip 01/18/2013  . Night sweats 09/06/2012  . Chest pain   . Atrial fibrillation with slow ventricular response ([place])   . Anemia   . Anxiety and depression   . Gastroesophageal reflux disease   . Hepatitis   . Jaundice   . Hypertension 07/01/2010  . Hypercholesterolemia 07/01/2010  . DM2 (diabetes mellitus, type 2) ([place]) 07/01/2010  . Rheumatoid arthritis ([place]) 07/01/2010  . Sinus tachycardia 07/01/2010  . Chest pain, atypical 07/01/2010    Past Surgical  History:  Procedure Laterality Date  . CATARACT EXTRACTION Left   . COLONOSCOPY WITH PROPOFOL N/A 02/20/2015   Procedure: COLONOSCOPY WITH PROPOFOL;  Surgeon: Carol Ada, MD;  Location: [facility] ENDOSCOPY;  Service: Endoscopy;  Laterality: N/A;  . ESOPHAGOGASTRODUODENOSCOPY (EGD) WITH PROPOFOL N/A 02/20/2015   Procedure: ESOPHAGOGASTRODUODENOSCOPY (EGD) WITH PROPOFOL;  Surgeon: Carol Ada, MD;  Location: [facility] ENDOSCOPY;  Service: Endoscopy;  Laterality: N/A;  . HAND SURGERY  1995 and 1996   artificial joints both hands  . JOINT REPLACEMENT    . LUMBAR LAMINECTOMY/DECOMPRESSION MICRODISCECTOMY N/A 02/10/2016   Procedure:  MICROLUMBAR DECOMPRESSION L4-L5 AND L3- L4, AND EXCISION OF SYNOVIAL CYST L4-L5;  Surgeon: Susa Day, MD;  Location: [facility] ORS;  Service: Orthopedics;  Laterality: N/A;  . TOTAL HIP ARTHROPLASTY Left 2009  . TOTAL HIP ARTHROPLASTY Right 04/03/2013   Procedure: RIGHT TOTAL HIP ARTHROPLASTY ANTERIOR APPROACH;  Surgeon: Gearlean Alf, MD;  Location: [facility] ORS;  Service: Orthopedics;  Laterality: Right;    OB History    No data available       Home Medications    Prior to Admission medications   Medication Sig Start Date End Date Taking? Authorizing Provider  buPROPion (WELLBUTRIN SR) 150 MG 12 hr tablet Take 150 mg by mouth 2 (two) times daily.      Historical Provider, MD  Cholecalciferol (VITAMIN D3) 1000 units CAPS Take 1,000 Units by mouth daily.    Historical Provider, MD  Cyanocobalamin (VITAMIN B 12 PO) Take 1,000 mcg by mouth daily.     Historical Provider, MD  diltiazem (CARDIZEM CD) 240 MG 24 hr capsule Take 1 capsule (240 mg total) by mouth daily. 03/15/16   Lelon Perla, MD  esomeprazole (NEXIUM) 40 MG capsule Take 40 mg by mouth daily.      Historical Provider, MD  ferrous sulfate 325 (65 FE) MG EC tablet Take 325 mg by mouth every other day.    Historical Provider, MD  fluticasone (FLONASE) 50 MCG/ACT nasal spray Place 1-2 sprays into both nostrils daily as needed for allergies.  04/12/15   Historical Provider, MD  furosemide (LASIX) 40 MG tablet TAKE 1 TABLET (40 MG TOTAL) BY MOUTH DAILY. 12/23/15   Lelon Perla, MD  glimepiride (AMARYL) 2 MG tablet Take 2 mg by mouth every morning. 11/03/14   Historical Provider, MD  leflunomide (ARAVA) 20 MG tablet Take 20 mg by mouth daily.  04/26/13   Historical Provider, MD  metFORMIN (GLUCOPHAGE-XR) 500 MG 24 hr tablet Take 1,000 mg by mouth 2 (two) times daily. 09/02/14   Historical Provider, MD  metoprolol (LOPRESSOR) 50 MG tablet Take 1.5 tablets (75 mg total) by mouth 2 (two) times daily. 10/09/15   Lelon Perla, MD  Multiple  Vitamins-Minerals (PRESERVISION AREDS) CAPS Take 1 capsule by mouth 2 (two) times daily.    Historical Provider, MD  naproxen sodium (ANAPROX) 220 MG tablet Take 2 tablets (440 mg total) by mouth 2 (two) times daily as needed (pain). Resume 5 days post-op as needed 02/11/16   Cecilie Kicks, PA-C  nitroGLYCERIN (NITROSTAT) 0.4 MG SL tablet Place 0.4 mg under the tongue every 5 (five) minutes as needed for chest pain (x 3 doses).     Historical Provider, MD  Omega 3 1200 MG CAPS Take 1 capsule (1,200 mg total) by mouth daily. May resume 5 days post-op 02/11/16   Cecilie Kicks, PA-C  ORENCIA 125 MG/ML SOSY Inject 125 mg into the skin every 7 (seven) days. Either  Wed or Thurs. 01/27/15   Historical Provider, MD  potassium chloride (KLOR-CON 10) 10 MEQ tablet Take 2 tablets (20 mEq total) by mouth 2 (two) times daily. 01/04/16   Lelon Perla, MD  predniSONE (DELTASONE) 5 MG tablet Take 5 mg by mouth daily.    Historical Provider, MD  rivaroxaban (XARELTO) 20 MG TABS tablet Take 1 tablet (20 mg total) by mouth daily with supper. Resume 5 days post-op 02/11/16   Cecilie Kicks, PA-C  rosuvastatin (CRESTOR) 10 MG tablet Take 10 mg by mouth every evening.    Historical Provider, MD    Family History Family History  Problem Relation Age of Onset  . Stroke Father   . Heart failure Sister   . Hypertension Sister   . Heart attack Neg Hx     Social History Social History  Substance Use Topics  . Smoking status: Never Smoker  . Smokeless tobacco: Never Used  . Alcohol use No     Allergies   Patient has no known allergies.   Review of Systems Review of Systems  Constitutional: Negative for chills and fever.  Respiratory: Positive for shortness of breath. Negative for cough and chest tightness.   Cardiovascular: Positive for leg swelling. Negative for chest pain and palpitations.  Gastrointestinal: Negative for abdominal pain, diarrhea, nausea and vomiting.  Genitourinary: Negative for  dysuria, flank pain and pelvic pain.  Musculoskeletal: Negative for arthralgias, myalgias, neck pain and neck stiffness.  Skin: Negative for rash.  Neurological: Negative for dizziness, weakness and headaches.  All other systems reviewed and are negative.    Physical Exam Updated Vital Signs BP 148/78 (BP Location: Left Arm)   Pulse 111   Temp 97.6 F (36.4 C) (Oral)   Resp 17   Ht 5\' 7"  (1.702 m)   Wt 81.6 kg   SpO2 100%   BMI 28.19 kg/m   Physical Exam  Constitutional: She appears well-developed and well-nourished. No distress.  HENT:  Head: Normocephalic.  Eyes: Conjunctivae are normal.  Neck: Neck supple.  Cardiovascular: Normal heart sounds and intact distal pulses.   Tachycardic, irregularly irregular rhythm  Pulmonary/Chest: Effort normal and breath sounds normal. No respiratory distress. She has no wheezes. She has no rales.  Abdominal: Soft. Bowel sounds are normal. She exhibits no distension. There is no tenderness. There is no rebound.  Musculoskeletal: She exhibits no edema.  Normal-appearing right lower leg. 1+ edema to the left lower leg with mild erythema. No tenderness to the touch over the leg or calf. Full range of motion of the knee and ankle joints. Dorsal pedal pulses are intact and equal bilaterally  Neurological: She is alert.  Skin: Skin is warm and dry.  Psychiatric: She has a normal mood and affect. Her behavior is normal.  Nursing note and vitals reviewed.    ED Treatments / Results  Labs (all labs ordered are listed, but only abnormal results are displayed) Labs Reviewed  CBC WITH DIFFERENTIAL/PLATELET - Abnormal; Notable for the following:       Result Value   WBC 11.2 (*)    RBC 3.74 (*)    Hemoglobin 7.7 (*)    HCT 26.8 (*)    MCV 71.7 (*)    MCH 20.6 (*)    MCHC 28.7 (*)    RDW 19.5 (*)    Neutro Abs 8.9 (*)    All other components within normal limits  BASIC METABOLIC PANEL - Abnormal; Notable for the following:    CO2  21 (*)      Calcium 8.8 (*)    All other components within normal limits  BRAIN NATRIURETIC PEPTIDE - Abnormal; Notable for the following:    B Natriuretic Peptide 227.9 (*)    All other components within normal limits  D-DIMER, QUANTITATIVE (NOT AT [facility]) - Abnormal; Notable for the following:    D-Dimer, Quant 0.51 (*)    All other components within normal limits  GLUCOSE, CAPILLARY - Abnormal; Notable for the following:    Glucose-Capillary 41 (*)    All other components within normal limits  GLUCOSE, CAPILLARY - Abnormal; Notable for the following:    Glucose-Capillary 50 (*)    All other components within normal limits  POC OCCULT BLOOD, ED - Abnormal; Notable for the following:    Fecal Occult Bld POSITIVE (*)    All other components within normal limits  GLUCOSE, CAPILLARY  GLUCOSE, CAPILLARY  CBC  BASIC METABOLIC PANEL  FERRITIN  IRON AND TIBC  I-STAT TROPOININ, ED  TYPE AND SCREEN  PREPARE RBC (CROSSMATCH)    EKG  EKG Interpretation None       Radiology Dg Chest 2 View  Result Date: 03/19/2016 CLINICAL DATA:  Shortness of Breath EXAM: CHEST  2 VIEW COMPARISON:  02/21/2015 FINDINGS: Heart is upper limits normal in size. Mild peribronchial thickening. No confluent opacities or effusions. No acute bony abnormality. IMPRESSION: Mild bronchitic changes. Electronically Signed   By: Rolm Baptise M.D.   On: 03/19/2016 15:46    Procedures Procedures (including critical care time)  Medications Ordered in ED Medications - No data to display   Initial Impression / Assessment and Plan / ED Course  I have reviewed the triage vital signs and the nursing notes.  Pertinent labs & imaging results that were available during my care of the patient were reviewed by me and considered in my medical decision making (see chart for details).  Clinical Course     Pt with worsening SOB over last two days. Hx of afib and dvt. On xarelto. CXR negative. Will get labs including d dimer. Will  monitor. ECG showing afib, heart rate between 90-115, will monitor.   D dimer age adjusted is negative. Hgb is 7.7, baseline is around 9.5. Pt with low hgb in the past, required iron infusion. Hemoccult obtained and positive. In Setting of positive hemoccult and anticoagulation, will order transfusion. Pt has not had her dose of xarelto today. Question if SOB is due to anemia. Will get pt admitted for observation and further treatment.   Spoke with triad, will admit.   Vitals:   03/19/16 2158 03/19/16 2208 03/19/16 2228 03/19/16 2229  BP:  116/63 124/84   Pulse: 109 102 (!) 114   Resp: 25 22 (!) 24   Temp:  97.7 F (36.5 C) 97.6 F (36.4 C)   TempSrc:  Oral Oral   SpO2: 96% 95% 96%   Weight:    80.4 kg  Height:    5\' 7"  (1.702 m)     Final Clinical Impressions(s) / ED Diagnoses   Final diagnoses:  Anemia, unspecified type  Shortness of breath    New Prescriptions Current Discharge Medication List       Jeannett Senior, PA-C 03/20/16 0054    Julianne Rice, MD 03/21/16 [phone]

## 2016-03-19 NOTE — ED Notes (Signed)
Ambulated pt w/pulse ox.  Pt stayed above 94% consistently, gait steady and independent.  MD aware.

## 2016-03-19 NOTE — ED Triage Notes (Addendum)
Pt c/o SOB intermittently, "it just comes and goes". States she has A-fib, spoke with her PCP and said if she has one of these episodes then to come to the ER. Pt states she had an episode of SOB yesterday that lasted just a few seconds. Pt states SOB occurs mostly with exertion. Pt denies chest pain. Speaking in full sentences.

## 2016-03-19 NOTE — H&P (Addendum)
History and Physical    [name] [id number] DOB: Feb 04, 1946 DOA: 03/19/2016  PCP: Tula Nakayama  Patient coming from: Home  Chief Complaint: shortness of breath  HPI: [name] is a 70 y.o. female with medical history significant of DM2, MNG, RA, chronic DVT in the left leg, iron deficiency anemia, HTN, HLD< GERD, Afib on Xarelto, CKD who presents for SOB on exertion.  The patient reports that for about 3 weeks she has had worsening SOB to where she couldn't walk across her trailer without getting short of breath.  She has been more sedentary.  She has had cough without sputum or chest pain.  She notes fatigue sometimes.  She denies dark stools, pleuritic chest pain or decreased PO intake. She recently had back surgery about a month ago and reports that it has been successful.  She was off Xarelto at that time for 3 days only and reports that she has been taking it faithfully since that time.  She has left leg swelling for which she has seen vascular surgery and has been diagnosed with a chronic LE DVT. Vascular surgery note from 10/11 reviewed and it was noted that no intervention was needed prior to her back surgery on 11/8.  Of note, she has been seen in Oncology for iron deficiency anemia in May of this year.  She was treated with iron supplementation and her anemia improved.  Her Hgb today was 7.7, down from 9.5 on 11/1.  Colonoscopy from last year showed a polyp which was sent for pathology and showed unremarkable mucosa.  She was noted to have a cecal AVM at that time.    ED Course: In the ED, she was fatigued, ambulated without drop in O2 saturation.  Hgb found to be 7.7.  D dimer just barely elevated to 0.51.  If using age based adjustment this is normal, however, she also has a chronic LE DVT which could explain the very mild elevation.  She has been on Xarelto.  FOBT was noted to be positive.  CXR showed mild bronchitic changes.   Review of Systems: As per HPI otherwise  10 point review of systems negative.    Past Medical History:  Diagnosis Date  . Actinic keratosis   . Anemia    hx of  . Anemia of chronic renal failure, stage 3 (moderate) 07/17/2015  . Anticoagulant causing adverse effect in therapeutic use 07/17/2015  . Anxiety   . Atrial fibrillation ([place])   . Atrial fibrillation ([place])   . Carpal tunnel syndrome   . Chest pain   . Depression   . Edema   . Gastroesophageal reflux disease   . Hepatitis 1970  . Hiatal hernia   . Hyperlipidemia   . Hypertension   . Iron deficiency anemia due to chronic blood loss 07/17/2015  . Jaundice    age 22  . Memory loss   . Peripheral vascular disease (HCC)    DVT left lower leg  . Rheumatoid arthritis(714.0)   . Thyroid disease    multi nodular goiter  . Type II diabetes mellitus ([place])     Past Surgical History:  Procedure Laterality Date  . CATARACT EXTRACTION Left   . COLONOSCOPY WITH PROPOFOL N/A 02/20/2015   Procedure: COLONOSCOPY WITH PROPOFOL;  Surgeon: Carol Ada, MD;  Location: [facility];  Service: Endoscopy;  Laterality: N/A;  . ESOPHAGOGASTRODUODENOSCOPY (EGD) WITH PROPOFOL N/A 02/20/2015   Procedure: ESOPHAGOGASTRODUODENOSCOPY (EGD) WITH PROPOFOL;  Surgeon: Carol Ada, MD;  Location: [facility];  Service: Endoscopy;  Laterality: N/A;  . HAND SURGERY  1995 and 1996   artificial joints both hands  . JOINT REPLACEMENT    . LUMBAR LAMINECTOMY/DECOMPRESSION MICRODISCECTOMY N/A 02/10/2016   Procedure: MICROLUMBAR DECOMPRESSION L4-L5 AND L3- L4, AND EXCISION OF SYNOVIAL CYST L4-L5;  Surgeon: Susa Day, MD;  Location: [facility];  Service: Orthopedics;  Laterality: N/A;  . TOTAL HIP ARTHROPLASTY Left 2009  . TOTAL HIP ARTHROPLASTY Right 04/03/2013   Procedure: RIGHT TOTAL HIP ARTHROPLASTY ANTERIOR APPROACH;  Surgeon: Gearlean Alf, MD;  Location: [facility];  Service: Orthopedics;  Laterality: Right;     reports that she has never smoked. She has never used smokeless tobacco. She  reports that she does not drink alcohol or use drugs.  No Known Allergies  Family History  Problem Relation Age of Onset  . Stroke Father   . Heart failure Sister   . Hypertension Sister   . Heart attack Neg Hx     Prior to Admission medications   Medication Sig Start Date End Date Taking? Authorizing Provider  buPROPion (WELLBUTRIN SR) 150 MG 12 hr tablet Take 150 mg by mouth 2 (two) times daily.     Yes Historical Provider, MD  Cholecalciferol (VITAMIN D3) 1000 units CAPS Take 1,000 Units by mouth daily.   Yes Historical Provider, MD  Cyanocobalamin (VITAMIN B 12 PO) Take 1,000 mcg by mouth daily.    Yes Historical Provider, MD  diltiazem (CARDIZEM CD) 240 MG 24 hr capsule Take 1 capsule (240 mg total) by mouth daily. 03/15/16  Yes Lelon Perla, MD  esomeprazole (NEXIUM) 40 MG capsule Take 40 mg by mouth daily.     Yes Historical Provider, MD  ferrous sulfate 325 (65 FE) MG EC tablet Take 325 mg by mouth every other day.   Yes Historical Provider, MD  furosemide (LASIX) 40 MG tablet TAKE 1 TABLET (40 MG TOTAL) BY MOUTH DAILY. 12/23/15  Yes Lelon Perla, MD  glimepiride (AMARYL) 2 MG tablet Take 2 mg by mouth every morning. 11/03/14  Yes Historical Provider, MD  leflunomide (ARAVA) 20 MG tablet Take 20 mg by mouth daily.  04/26/13  Yes Historical Provider, MD  metFORMIN (GLUCOPHAGE-XR) 500 MG 24 hr tablet Take 1,000 mg by mouth 2 (two) times daily. 09/02/14  Yes Historical Provider, MD  metoprolol (LOPRESSOR) 50 MG tablet Take 1.5 tablets (75 mg total) by mouth 2 (two) times daily. 10/09/15  Yes Lelon Perla, MD  Multiple Vitamins-Minerals (PRESERVISION AREDS) CAPS Take 1 capsule by mouth 2 (two) times daily.   Yes Historical Provider, MD  Omega 3 1200 MG CAPS Take 1 capsule (1,200 mg total) by mouth daily. May resume 5 days post-op 02/11/16  Yes Jaclyn M Bissell, PA-C  ORENCIA 125 MG/ML SOSY Inject 125 mg into the skin every 7 (seven) days. Thurs. 01/27/15  Yes Historical Provider,  MD  potassium chloride (KLOR-CON 10) 10 MEQ tablet Take 2 tablets (20 mEq total) by mouth 2 (two) times daily. 01/04/16  Yes Lelon Perla, MD  predniSONE (DELTASONE) 5 MG tablet Take 5 mg by mouth daily.   Yes Historical Provider, MD  rivaroxaban (XARELTO) 20 MG TABS tablet Take 1 tablet (20 mg total) by mouth daily with supper. Resume 5 days post-op 02/11/16  Yes Cecilie Kicks, PA-C  rosuvastatin (CRESTOR) 10 MG tablet Take 10 mg by mouth daily.    Yes Historical Provider, MD  fluticasone (FLONASE) 50 MCG/ACT nasal spray Place 1-2 sprays into  both nostrils daily as needed for allergies.  04/12/15   Historical Provider, MD  naproxen sodium (ANAPROX) 220 MG tablet Take 2 tablets (440 mg total) by mouth 2 (two) times daily as needed (pain). Resume 5 days post-op as needed Patient not taking: Reported on 03/19/2016 02/11/16   Cecilie Kicks, PA-C  nitroGLYCERIN (NITROSTAT) 0.4 MG SL tablet Place 0.4 mg under the tongue every 5 (five) minutes as needed for chest pain (x 3 doses).     Historical Provider, MD    Physical Exam: Vitals:   03/19/16 2158 03/19/16 2208 03/19/16 2228 03/19/16 2229  BP:  116/63 124/84   Pulse: 109 102 (!) 114   Resp: 25 22 (!) 24   Temp:  97.7 F (36.5 C) 97.6 F (36.4 C)   TempSrc:  Oral Oral   SpO2: 96% 95% 96%   Weight:    177 lb 4 oz (80.4 kg)  Height:    5\' 7"  (1.702 m)      Constitutional: elderly woman, lying in bed, glasses on, NAD.  She was falling asleep during exam.  Vitals:   03/19/16 2158 03/19/16 2208 03/19/16 2228 03/19/16 2229  BP:  116/63 124/84   Pulse: 109 102 (!) 114   Resp: 25 22 (!) 24   Temp:  97.7 F (36.5 C) 97.6 F (36.4 C)   TempSrc:  Oral Oral   SpO2: 96% 95% 96%   Weight:    177 lb 4 oz (80.4 kg)  Height:    5\' 7"  (1.702 m)   Eyes: PERRL, mild conjunctival pallor ENMT: Mucous membranes are somewhat dry. Posterior pharynx clear of any exudate or lesions. Neck: normal, supple, no masses Respiratory: clear to auscultation  bilaterally, no wheezing, no crackles. Normal respiratory effort. No accessory muscle use.  Cardiovascular: Tachycardic, irreg irreg.  Left sided pitting edema to mid calf.  No tenderness to palpation. 2+ pedal pulses.  Abdomen: no tenderness, no masses palpated.Bowel sounds positive.  Musculoskeletal: no clubbing / cyanosis. No joint deformity upper and lower extremities. Normal muscle tone.  No ttp over left leg at all. Neg homan's sign Skin: no rashes, lesions, ulcers. + mild erythema to left lower extremity.  Neurologic:  Sensation intact to light touch. Moving all extremities easily.  Psychiatric: Normal judgment and insight. Alert and oriented x 3. Normal mood.  Sleepy.   Labs on Admission: I have personally reviewed following labs and imaging studies  CBC:  Recent Labs Lab 03/19/16 1740  WBC 11.2*  NEUTROABS 8.9*  HGB 7.7*  HCT 26.8*  MCV 71.7*  PLT 123456   Basic Metabolic Panel:  Recent Labs Lab 03/19/16 1740  NA 140  K 4.0  CL 106  CO2 21*  GLUCOSE 67  BUN 16  CREATININE 0.80  CALCIUM 8.8*   GFR: Estimated Creatinine Clearance: 70.4 mL/min (by C-G formula based on SCr of 0.8 mg/dL).   Radiological Exams on Admission: Dg Chest 2 View  Result Date: 03/19/2016 CLINICAL DATA:  Shortness of Breath EXAM: CHEST  2 VIEW COMPARISON:  02/21/2015 FINDINGS: Heart is upper limits normal in size. Mild peribronchial thickening. No confluent opacities or effusions. No acute bony abnormality. IMPRESSION: Mild bronchitic changes. Electronically Signed   By: Rolm Baptise M.D.   On: 03/19/2016 15:46    EKG: Independently reviewed. Afib with mild RVR of 108  Assessment/Plan Shortness of breath, most consistent with symptomatic anemia - DDX included anemia, PE/DVT, other lung pathology such as pneumonia.  As regards DVT/PE.  She  had a mildly elevated D dimer which when taking in to account age could be considered normal. She has a LLE DVT which was found on 8/30 and now taken to  be chronic as she has persistent LLE swelling.  She has been seen by vascular surgery.  She is on Xarelto chronically for Afib.  Her Well's score is high (6.0) given her history.   - For the anemia, she has heme + stool and known history of cecal AVM.  She is on Xarelto which could increase her chance for GI bleeding.  Her Hgb has dropped from 9.5 --> 7.7 in 6 weeks.  This is more significant than what would be expected after her back surgery - CXR shows mild bronchitic changes. - SOB is multifactorial.  ED team has ordered PRBC which is reasonable given above - Evaluate breathing post blood transfusion to see if it helps - Continue xarelto - I do not think advanced imaging of the chest would be warranted at this time given that she is already on definitive treatment with Xarelto if she were to have a PE - Consider repeat US of LLE if swelling worsens  Hypertension - BP 110s to 140s while in the ED - Continue diltiazem and metoprolol which are also for rate control - Continue lasix and KCl supplementation.     Hypercholesterolemia - Continue rosuvastatin    DM2 (diabetes mellitus, type 2) (HCC) - Continue metformin, hold sulfonylurea - SSI    Rheumatoid arthritis (HCC) - Continue leflunomide and chronic steroids (prednisone 5mg ) - No current symptoms of joint pain per her    Chronic deep vein thrombosis (DVT) of left lower extremity ([place]) - Consider repeat ultrasound of lower extremity - Continue Xarelto.     A-fib (HCC) - Mildly tachycardic, possibly related to anemia - Continue rate controlling medications with diltiazem and metoprolol - Continue xarelto    Iron deficiency anemia with concomitant anemia of chronic disease - Iron panel in the AM - Trend CBC  Leukocytosis - No clear explanation, no obvious infections found, UA clear and no infectious symptoms except SOB.   - Possibly related to bronchitis seen on CXR - Trend  DVT prophylaxis: Xarelto  Code Status: Full    Family Communication: No family at bedside  Disposition Plan: Re-evaluate post blood transfusion   Admission status: Observation, telemetry   Gilles Chiquito MD [facility] Pager [phone]  If 7PM-7AM, please contact night-coverage [email or web address] Password [email or web address]  03/19/2016, 11:37 PM

## 2016-03-20 DIAGNOSIS — D631 Anemia in chronic kidney disease: Secondary | ICD-10-CM | POA: Diagnosis present

## 2016-03-20 DIAGNOSIS — K31811 Angiodysplasia of stomach and duodenum with bleeding: Secondary | ICD-10-CM | POA: Diagnosis not present

## 2016-03-20 DIAGNOSIS — I129 Hypertensive chronic kidney disease with stage 1 through stage 4 chronic kidney disease, or unspecified chronic kidney disease: Secondary | ICD-10-CM | POA: Diagnosis present

## 2016-03-20 DIAGNOSIS — E1151 Type 2 diabetes mellitus with diabetic peripheral angiopathy without gangrene: Secondary | ICD-10-CM | POA: Diagnosis present

## 2016-03-20 DIAGNOSIS — I82502 Chronic embolism and thrombosis of unspecified deep veins of left lower extremity: Secondary | ICD-10-CM | POA: Diagnosis present

## 2016-03-20 DIAGNOSIS — K5521 Angiodysplasia of colon with hemorrhage: Secondary | ICD-10-CM | POA: Diagnosis present

## 2016-03-20 DIAGNOSIS — N183 Chronic kidney disease, stage 3 (moderate): Secondary | ICD-10-CM | POA: Diagnosis present

## 2016-03-20 DIAGNOSIS — I480 Paroxysmal atrial fibrillation: Secondary | ICD-10-CM | POA: Diagnosis not present

## 2016-03-20 DIAGNOSIS — D500 Iron deficiency anemia secondary to blood loss (chronic): Secondary | ICD-10-CM | POA: Diagnosis not present

## 2016-03-20 DIAGNOSIS — I4891 Unspecified atrial fibrillation: Secondary | ICD-10-CM | POA: Diagnosis not present

## 2016-03-20 DIAGNOSIS — E7800 Pure hypercholesterolemia, unspecified: Secondary | ICD-10-CM | POA: Diagnosis present

## 2016-03-20 DIAGNOSIS — K219 Gastro-esophageal reflux disease without esophagitis: Secondary | ICD-10-CM | POA: Diagnosis present

## 2016-03-20 DIAGNOSIS — Z7901 Long term (current) use of anticoagulants: Secondary | ICD-10-CM | POA: Diagnosis not present

## 2016-03-20 DIAGNOSIS — R0602 Shortness of breath: Secondary | ICD-10-CM

## 2016-03-20 DIAGNOSIS — Z79899 Other long term (current) drug therapy: Secondary | ICD-10-CM | POA: Diagnosis not present

## 2016-03-20 DIAGNOSIS — I482 Chronic atrial fibrillation: Secondary | ICD-10-CM | POA: Diagnosis not present

## 2016-03-20 DIAGNOSIS — E11319 Type 2 diabetes mellitus with unspecified diabetic retinopathy without macular edema: Secondary | ICD-10-CM | POA: Diagnosis not present

## 2016-03-20 DIAGNOSIS — Z7984 Long term (current) use of oral hypoglycemic drugs: Secondary | ICD-10-CM | POA: Diagnosis not present

## 2016-03-20 DIAGNOSIS — M069 Rheumatoid arthritis, unspecified: Secondary | ICD-10-CM | POA: Diagnosis present

## 2016-03-20 DIAGNOSIS — F419 Anxiety disorder, unspecified: Secondary | ICD-10-CM | POA: Diagnosis present

## 2016-03-20 DIAGNOSIS — D649 Anemia, unspecified: Secondary | ICD-10-CM | POA: Diagnosis not present

## 2016-03-20 DIAGNOSIS — D509 Iron deficiency anemia, unspecified: Secondary | ICD-10-CM | POA: Diagnosis not present

## 2016-03-20 DIAGNOSIS — E1122 Type 2 diabetes mellitus with diabetic chronic kidney disease: Secondary | ICD-10-CM | POA: Diagnosis present

## 2016-03-20 DIAGNOSIS — K254 Chronic or unspecified gastric ulcer with hemorrhage: Secondary | ICD-10-CM | POA: Diagnosis present

## 2016-03-20 DIAGNOSIS — Z96643 Presence of artificial hip joint, bilateral: Secondary | ICD-10-CM | POA: Diagnosis present

## 2016-03-20 DIAGNOSIS — I82492 Acute embolism and thrombosis of other specified deep vein of left lower extremity: Secondary | ICD-10-CM | POA: Diagnosis not present

## 2016-03-20 DIAGNOSIS — F329 Major depressive disorder, single episode, unspecified: Secondary | ICD-10-CM | POA: Diagnosis present

## 2016-03-20 LAB — CBC
HCT: 24.7 % — ABNORMAL LOW (ref 36.0–46.0)
HEMATOCRIT: 32.4 % — AB (ref 36.0–46.0)
HEMOGLOBIN: 9.6 g/dL — AB (ref 12.0–15.0)
Hemoglobin: 7 g/dL — ABNORMAL LOW (ref 12.0–15.0)
MCH: 20.5 pg — AB (ref 26.0–34.0)
MCH: 22.3 pg — ABNORMAL LOW (ref 26.0–34.0)
MCHC: 28.3 g/dL — AB (ref 30.0–36.0)
MCHC: 29.6 g/dL — ABNORMAL LOW (ref 30.0–36.0)
MCV: 72.4 fL — ABNORMAL LOW (ref 78.0–100.0)
MCV: 75.2 fL — ABNORMAL LOW (ref 78.0–100.0)
Platelets: 310 10*3/uL (ref 150–400)
Platelets: 305 10*3/uL (ref 150–400)
RBC: 3.41 MIL/uL — ABNORMAL LOW (ref 3.87–5.11)
RBC: 4.31 MIL/uL (ref 3.87–5.11)
RDW: 19.8 % — AB (ref 11.5–15.5)
RDW: 20 % — AB (ref 11.5–15.5)
WBC: 11.6 10*3/uL — ABNORMAL HIGH (ref 4.0–10.5)
WBC: 11.9 10*3/uL — AB (ref 4.0–10.5)

## 2016-03-20 LAB — GLUCOSE, CAPILLARY
GLUCOSE-CAPILLARY: 126 mg/dL — AB (ref 65–99)
GLUCOSE-CAPILLARY: 139 mg/dL — AB (ref 65–99)
GLUCOSE-CAPILLARY: 71 mg/dL (ref 65–99)
GLUCOSE-CAPILLARY: 73 mg/dL (ref 65–99)
GLUCOSE-CAPILLARY: 77 mg/dL (ref 65–99)
Glucose-Capillary: 152 mg/dL — ABNORMAL HIGH (ref 65–99)
Glucose-Capillary: 170 mg/dL — ABNORMAL HIGH (ref 65–99)
Glucose-Capillary: 66 mg/dL (ref 65–99)
Glucose-Capillary: 70 mg/dL (ref 65–99)
Glucose-Capillary: 70 mg/dL (ref 65–99)
Glucose-Capillary: 71 mg/dL (ref 65–99)

## 2016-03-20 LAB — BASIC METABOLIC PANEL
Anion gap: 9 (ref 5–15)
BUN: 16 mg/dL (ref 6–20)
CALCIUM: 9.1 mg/dL (ref 8.9–10.3)
CO2: 25 mmol/L (ref 22–32)
CREATININE: 0.67 mg/dL (ref 0.44–1.00)
Chloride: 108 mmol/L (ref 101–111)
GFR calc non Af Amer: >60 mL/min (ref >60)
Glucose, Bld: 77 mg/dL (ref 65–99)
Potassium: 3.7 mmol/L (ref 3.5–5.1)
SODIUM: 142 mmol/L (ref 135–145)

## 2016-03-20 LAB — IRON AND TIBC
IRON: 9 ug/dL — AB (ref 28–170)
Saturation Ratios: 2 % — ABNORMAL LOW (ref 10.4–31.8)
TIBC: 378 ug/dL (ref 250–450)
UIBC: 369 ug/dL

## 2016-03-20 LAB — FERRITIN: Ferritin: 19 ng/mL (ref 11–307)

## 2016-03-20 MED ORDER — DEXTROSE-NACL 5-0.9 % IV SOLN
INTRAVENOUS | Status: DC
  Administered 2016-03-20: 03:00:00 via INTRAVENOUS

## 2016-03-20 MED ORDER — GLUCOSE 40 % PO GEL
ORAL | Status: AC
  Administered 2016-03-20: 37.5 g
  Filled 2016-03-20: qty 1

## 2016-03-20 MED ORDER — DEXTROSE-NACL 5-0.9 % IV SOLN: INTRAVENOUS | Status: DC

## 2016-03-20 MED ORDER — PEG-KCL-NACL-NASULF-NA ASC-C 100 G PO SOLR
0.5000 | Freq: Once | ORAL | Status: AC
  Administered 2016-03-20: 100 g via ORAL
  Filled 2016-03-20: qty 1

## 2016-03-20 MED ORDER — SODIUM CHLORIDE 0.9 % IV SOLN
125.0000 mg | Freq: Once | INTRAVENOUS | Status: AC
  Administered 2016-03-20: 125 mg via INTRAVENOUS
  Filled 2016-03-20: qty 10

## 2016-03-20 MED ORDER — DEXTROSE 50 % IV SOLN
INTRAVENOUS | Status: AC
  Administered 2016-03-20: 25 mL
  Filled 2016-03-20: qty 50

## 2016-03-20 NOTE — Progress Notes (Signed)
Pt has continued to have blood sugars ranging from 41 - 77. Pt was given treatment using hypoglycemic protocol. Blood sugar went up to 126 @ 0313, however by 0429 blood sugar dropped back down to 70. On call MD has been notified and is aware.   Order for D5 NS was given and to monitor blood sugars closely. Pt also given 2nd peripheral IV line to accommodate the ordered blood transfusion.   Will continue to monitor.  Conception Oms

## 2016-03-20 NOTE — Consult Note (Signed)
Consultation  Referring Provider: [facility]     Primary Care Physician:  Tula Nakayama Primary Gastroenterologist:  Carol Ada       Reason for Consultation:     Iron deficiency Anemia            HPI:   [name] is a 70 y.o. female with h/o chronic DVT in the left leg,  Afib on Xarelto, CKD who presents for SOB on exertion and worsening anemia. Patient has been having progressively down trending Hgb for past few months, was seen by hematology in May 2017 and started on Oral Iron. Patient denies any blood per rectum. She has dark stools due to iron but denies any tarry black stool. On admission Hgb 7.7 compared to 9.5 on 02/03/16.  EGD and Colonoscopy by Dr Benson Norway in 02/2015 showed cecal avm, no upper source for blood loss.  She received 2 U prbc. Iron studies show severe iron deficiency with ferritin 19 Past Medical History:  Diagnosis Date  . Actinic keratosis   . Anemia    hx of  . Anemia of chronic renal failure, stage 3 (moderate) 07/17/2015  . Anticoagulant causing adverse effect in therapeutic use 07/17/2015  . Anxiety   . Atrial fibrillation ([place])   . Atrial fibrillation ([place])   . Carpal tunnel syndrome   . Chest pain   . Depression   . Edema   . Gastroesophageal reflux disease   . Hepatitis 1970  . Hiatal hernia   . Hyperlipidemia   . Hypertension   . Iron deficiency anemia due to chronic blood loss 07/17/2015  . Jaundice    age 17  . Memory loss   . Peripheral vascular disease (HCC)    DVT left lower leg  . Rheumatoid arthritis(714.0)   . Thyroid disease    multi nodular goiter  . Type II diabetes mellitus ([place])     Past Surgical History:  Procedure Laterality Date  . CATARACT EXTRACTION Left   . COLONOSCOPY WITH PROPOFOL N/A 02/20/2015   Procedure: COLONOSCOPY WITH PROPOFOL;  Surgeon: Carol Ada, MD;  [facility]: [facility];  Service: Endoscopy;  Laterality: N/A;  . ESOPHAGOGASTRODUODENOSCOPY (EGD) WITH PROPOFOL N/A 02/20/2015   Procedure: ESOPHAGOGASTRODUODENOSCOPY (EGD) WITH PROPOFOL;  Surgeon: Carol Ada, MD;  Location: [facility];  Service: Endoscopy;  Laterality: N/A;  . HAND SURGERY  1995 and 1996   artificial joints both hands  . JOINT REPLACEMENT    . LUMBAR LAMINECTOMY/DECOMPRESSION MICRODISCECTOMY N/A 02/10/2016   Procedure: MICROLUMBAR DECOMPRESSION L4-L5 AND L3- L4, AND EXCISION OF SYNOVIAL CYST L4-L5;  Surgeon: Susa Day, MD;  Location: [facility];  Service: Orthopedics;  Laterality: N/A;  . TOTAL HIP ARTHROPLASTY Left 2009  . TOTAL HIP ARTHROPLASTY Right 04/03/2013   Procedure: RIGHT TOTAL HIP ARTHROPLASTY ANTERIOR APPROACH;  Surgeon: Gearlean Alf, MD;  Location: [facility];  Service: Orthopedics;  Laterality: Right;    Family History  Problem Relation Age of Onset  . Stroke Father   . Heart failure Sister   . Hypertension Sister   . Heart attack Neg Hx      Social History  Substance Use Topics  . Smoking status: Never Smoker  . Smokeless tobacco: Never Used  . Alcohol use No    Prior to Admission medications   Medication Sig Start Date End Date Taking? Authorizing Provider  buPROPion (WELLBUTRIN SR) 150 MG 12 hr tablet Take 150 mg by mouth 2 (two) times daily.     Yes Historical  Provider, MD  Cholecalciferol (VITAMIN D3) 1000 units CAPS Take 1,000 Units by mouth daily.   Yes Historical Provider, MD  Cyanocobalamin (VITAMIN B 12 PO) Take 1,000 mcg by mouth daily.    Yes Historical Provider, MD  diltiazem (CARDIZEM CD) 240 MG 24 hr capsule Take 1 capsule (240 mg total) by mouth daily. 03/15/16  Yes Lelon Perla, MD  esomeprazole (NEXIUM) 40 MG capsule Take 40 mg by mouth daily.     Yes Historical Provider, MD  ferrous sulfate 325 (65 FE) MG EC tablet Take 325 mg by mouth every other day.   Yes Historical Provider, MD  furosemide (LASIX) 40 MG tablet TAKE 1 TABLET (40 MG TOTAL) BY MOUTH DAILY. 12/23/15  Yes Lelon Perla, MD  glimepiride (AMARYL) 2 MG tablet Take 2 mg by mouth every  morning. 11/03/14  Yes Historical Provider, MD  leflunomide (ARAVA) 20 MG tablet Take 20 mg by mouth daily.  04/26/13  Yes Historical Provider, MD  metFORMIN (GLUCOPHAGE-XR) 500 MG 24 hr tablet Take 1,000 mg by mouth 2 (two) times daily. 09/02/14  Yes Historical Provider, MD  metoprolol (LOPRESSOR) 50 MG tablet Take 1.5 tablets (75 mg total) by mouth 2 (two) times daily. 10/09/15  Yes Lelon Perla, MD  Multiple Vitamins-Minerals (PRESERVISION AREDS) CAPS Take 1 capsule by mouth 2 (two) times daily.   Yes Historical Provider, MD  Omega 3 1200 MG CAPS Take 1 capsule (1,200 mg total) by mouth daily. May resume 5 days post-op 02/11/16  Yes Jaclyn M Bissell, PA-C  ORENCIA 125 MG/ML SOSY Inject 125 mg into the skin every 7 (seven) days. Thurs. 01/27/15  Yes Historical Provider, MD  potassium chloride (KLOR-CON 10) 10 MEQ tablet Take 2 tablets (20 mEq total) by mouth 2 (two) times daily. 01/04/16  Yes Lelon Perla, MD  predniSONE (DELTASONE) 5 MG tablet Take 5 mg by mouth daily.   Yes Historical Provider, MD  rivaroxaban (XARELTO) 20 MG TABS tablet Take 1 tablet (20 mg total) by mouth daily with supper. Resume 5 days post-op 02/11/16  Yes Cecilie Kicks, PA-C  rosuvastatin (CRESTOR) 10 MG tablet Take 10 mg by mouth daily.    Yes Historical Provider, MD  fluticasone (FLONASE) 50 MCG/ACT nasal spray Place 1-2 sprays into both nostrils daily as needed for allergies.  04/12/15   Historical Provider, MD  naproxen sodium (ANAPROX) 220 MG tablet Take 2 tablets (440 mg total) by mouth 2 (two) times daily as needed (pain). Resume 5 days post-op as needed Patient not taking: Reported on 03/19/2016 02/11/16   Cecilie Kicks, PA-C  nitroGLYCERIN (NITROSTAT) 0.4 MG SL tablet Place 0.4 mg under the tongue every 5 (five) minutes as needed for chest pain (x 3 doses).     Historical Provider, MD    Current Facility-Administered Medications  Medication Dose Route Frequency Provider Last Rate Last Dose  . buPROPion  (WELLBUTRIN SR) 12 hr tablet 150 mg  150 mg Oral BID Sid Falcon, MD   150 mg at 03/20/16 0943  . cholecalciferol (VITAMIN D) tablet 1,000 Units  1,000 Units Oral Daily Sid Falcon, MD   1,000 Units at 03/20/16 [phone]  . diltiazem (CARDIZEM CD) 24 hr capsule 240 mg  240 mg Oral Daily Sid Falcon, MD   240 mg at 03/20/16 0943  . ferrous sulfate tablet 325 mg  325 mg Oral QODAY Sid Falcon, MD   325 mg at 03/20/16 0942  . furosemide (LASIX) tablet 40  mg  40 mg Oral Daily Sid Falcon, MD   40 mg at 03/20/16 0943  . leflunomide (ARAVA) tablet 20 mg  20 mg Oral Daily Sid Falcon, MD   20 mg at 03/20/16 0943  . metoprolol tartrate (LOPRESSOR) tablet 75 mg  75 mg Oral BID Sid Falcon, MD   75 mg at 03/20/16 0942  . multivitamin (PROSIGHT) tablet   Oral BID Sid Falcon, MD   1 tablet at 03/20/16 [phone]  . omega-3 acid ethyl esters (LOVAZA) capsule 1 g  1 g Oral Daily Sid Falcon, MD   1 g at 03/20/16 [phone]  . ondansetron (ZOFRAN) tablet 4 mg  4 mg Oral Q6H PRN Sid Falcon, MD       Or  . ondansetron [facility]) injection 4 mg  4 mg Intravenous Q6H PRN Sid Falcon, MD      . pantoprazole (PROTONIX) EC tablet 80 mg  80 mg Oral Q1200 Sid Falcon, MD   80 mg at 03/20/16 0944  . peg 3350 powder (MOVIPREP) kit 100 g  0.5 kit Oral Once Jessica D Zehr, PA-C      . potassium chloride (K-DUR) CR tablet 20 mEq  20 mEq Oral BID Sid Falcon, MD   20 mEq at 03/20/16 0943  . predniSONE (DELTASONE) tablet 5 mg  5 mg Oral Daily Sid Falcon, MD   5 mg at 03/20/16 0943  . rivaroxaban (XARELTO) tablet 20 mg  20 mg Oral Q supper Sid Falcon, MD   20 mg at 03/20/16 1633  . rosuvastatin (CRESTOR) tablet 10 mg  10 mg Oral Daily Sid Falcon, MD   10 mg at 03/20/16 1633  . sodium chloride flush (NS) 0.9 % injection 3 mL  3 mL Intravenous Q12H Sid Falcon, MD        Allergies as of 03/19/2016  . (No Known Allergies)     Review of Systems:    This is positive for those things  mentioned in the HPI, also positive for sob All other review of systems are negative.       Physical Exam:  Vital signs in last 24 hours: Temp:  [97.6 F (36.4 C)-99.1 F (37.3 C)] 98.5 F (36.9 C) (12/17 1150) Pulse Rate:  [77-121] 102 (12/17 1150) Resp:  [15-25] 20 (12/17 1150) BP: (113-143)/(63-100) 143/79 (12/17 1150) SpO2:  [95 %-100 %] 98 % (12/17 1150) Weight:  [177 lb 4 oz (80.4 kg)] 177 lb 4 oz (80.4 kg) (12/16 2229) Last BM Date: 03/19/16  General:  Well-developed, well-nourished and in no acute distress Eyes:  anicteric. ENT:   Mouth and posterior pharynx free of lesions.  Neck:   supple w/o thyromegaly or mass.  Lungs: Clear to auscultation bilaterally. Heart:  S1S2, no rubs, murmurs, gallops. Abdomen:  soft, non-tender, no hepatosplenomegaly, hernia, or mass and BS+.  Lymph:  no cervical or supraclavicular adenopathy. Extremities:   LLE + edema Skin   no rash. Neuro:  A&O x 3.  Psych:  appropriate mood and  Affect.   Data Reviewed:   LAB RESULTS:  Recent Labs  03/19/16 1740 03/20/16 0518 03/20/16 1306  WBC 11.2* 11.6* 11.9*  HGB 7.7* 7.0* 9.6*  HCT 26.8* 24.7* 32.4*  PLT 345 310 305   BMET  Recent Labs  03/19/16 1740 03/20/16 0518  NA 140 142  K 4.0 3.7  CL 106 108  CO2 21* 25  GLUCOSE 67 77  BUN  16 16  CREATININE 0.80 0.67  CALCIUM 8.8* 9.1   LFT No results for input(s): PROT, ALBUMIN, AST, ALT, ALKPHOS, BILITOT, BILIDIR, IBILI in the last 72 hours. PT/INR No results for input(s): LABPROT, INR in the last 72 hours.  STUDIES: Dg Chest 2 View  Result Date: 03/19/2016 CLINICAL DATA:  Shortness of Breath EXAM: CHEST  2 VIEW COMPARISON:  02/21/2015 FINDINGS: Heart is upper limits normal in size. Mild peribronchial thickening. No confluent opacities or effusions. No acute bony abnormality. IMPRESSION: Mild bronchitic changes. Electronically Signed   By: Rolm Baptise M.D.   On: 03/19/2016 15:46     PREVIOUS ENDOSCOPIES:              EGD and colonoscopy 02/2015   Impression / Plan:   31 yr F with multiples co-morbidities, chronic iron deficiency anemia with worsening anemia likely secondary to chronic GI blood loss Patient is on chronic anticoagulation with Xarelto for DVT Will schedule for capsule endoscopy to assess for possible small bowel AVM and blood loss Patient may also need repeat EGD and colonoscopy based on small bowel video capsule study scheduled for tomorrow The risks and benefits as well as alternatives of procedure(s) have been discussed and reviewed. All questions answered. The patient agrees to proceed. Dr Hung/Dr Collene Mares will resume care of patient tomorrow     K. Denzil Magnuson , MD [phone] Mon-Fri 8a-5p [phone] after 5p, weekends, holidays  @  03/20/2016, 4:38 PM

## 2016-03-20 NOTE — Progress Notes (Signed)
PROGRESS NOTE  [name]  [id number] DOB: [birth year] DOA: 03/19/2016 PCP: Tula Nakayama  Outpatient Specialists: Dr. Benson Norway, Gastroenterology Dr. Stanford Breed, Cardiology Dr. Marin Olp, Hematology  Brief Narrative: [name] is a 70 y.o. female with medical history significant of DM2, RA, chronic DVT in the left leg, iron deficiency anemia, HTN, HLD, GERD, Afib on xarelto, CKD who presents for SOB on exertion worsening over past 3 weeks associated with fatigue. Denied chest pain, any signs/symptoms of bleeding. On arrival she appeared fatigued but not hypoxic, afebrile. +FOBT and hgb had dropped to 7.7 from 9.5, so she was admitted for transfusion and further evaluation. Transfusion still ongoing, though hgb dropped to 7.0 and HR climbing to AFib w/110s-120s. GI was consulted and is planning capsule endoscopy 12/18.   Assessment & Plan: Active Problems:   Hypertension   Hypercholesterolemia   DM2 (diabetes mellitus, type 2) (HCC)   Rheumatoid arthritis ([place])   Acute deep vein thrombosis (DVT) of left lower extremity (HCC)   A-fib (HCC)   Iron deficiency anemia due to chronic blood loss   Anemia of chronic renal failure, stage 3 (moderate)   Symptomatic anemia   Shortness of breath  Dyspnea: Anemia likely cause of dyspnea and increased AFib rate which is likely contributing to dyspnea. Other DDx of dyspnea thought to be less likely includes CHF exacerbation (unlikely given BNP 200's, CXR without pulm edema, weight at/below outpatient baseline of ~180lbs, currently 177lbs, no JVD and stable LE edema) PE w/ technically positive D-dimer (0.51) is unlikely as she has been on xarelto and that level is explainable by presence of presumed chronic DVT and age. Mild bronchitic changes but no pneumonia on XR.   Symptomatic anemia: Hgb 7.7 > 7.0 on admission from last hgb 9.5. +FOBT. ?Acute vs. chronic GI bleed as she's history of cecal AVM on Cscope Nov 2016. EGD neg at that time.  -  We've continued xarelto for chronic DVT and permanent AFib, has failed pradaxa. ?consider eliquis.  - Appreciate GI recommendations: capsule planned 12/18. Dr. Benson Norway to follow starting 12/18.   Permanent AFib: Followed by Dr. Stanford Breed.  - Anticipate improvement in rate with correction of anemia. If not < 110bpm, will titrate up rate control meds (diltiazem, metoprolol) - Continue xarelto    Iron deficiency anemia: Chronic, has gotten iron infusions in the past, but not recently, taking iron po QOD limited due to constipation. - Iron 9, %sat: 2, ferritin 19.  - Will give IV iron today after transfusions - Continue po iron as tolerated. - Trend CBC  Hypertension - BP 110s to 140s while in the ED - Continue diltiazem and metoprolol which are also for rate control - Continue lasix and KCl supplementation - last echo showed preserved EF, pt euvolemic.     Hypercholesterolemia - Continue rosuvastatin    T2DM: Last HbA1c 6.0%. Will hold metformin and sulfonylurea - Sensitive SSI    Rheumatoid arthritis: Chronic, stable - Continue leflunomide and chronic steroids (prednisone 5mg ), suspect leukocytosis due to this.  - Recommend stopping NSAIDs with ?GI bleed    Chronic deep vein thrombosis (DVT) of left lower extremity (HCC) - No indication for repeat ultrasound of LLE at this time, swelling is stable/diminished per pt. - Continue Xarelto.   DVT prophylaxis: Xarelto Code Status: Full Family Communication: None at bedside Disposition Plan: Anticipate discharge home in next 24 hours.   Consultants:   Gastroenterology, Dr. Silverio Decamp and Dr. Benson Norway  Procedures:   None  Antimicrobials:  None  Subjective: Pt with unchanged dyspnea on exertion from admission late last night. Ambulating to bathroom is not enough to provoke SOB. No chest pain or palpitations. Left leg swelling at baseline, not painful. Denies rectal bleeding or melena. Takes iron tablet QOD due to  constipation.  Objective: Vitals:   03/20/16 0600 03/20/16 0900 03/20/16 0935 03/20/16 1150  BP: 114/83 129/70 133/75 (!) 143/79  Pulse: (!) 108 (!) 119 (!) 121 (!) 102  Resp: 18 20 20 20   Temp: 98.2 F (36.8 C) 98.7 F (37.1 C) 97.8 F (36.6 C) 98.5 F (36.9 C)  TempSrc: Oral Oral Oral Oral  SpO2: 99% 98% 100% 98%  Weight:      Height:        Intake/Output Summary (Last 24 hours) at 03/20/16 1211 Last data filed at 03/20/16 1145  Gross per 24 hour  Intake          1562.92 ml  Output                0 ml  Net          1562.92 ml   Filed Weights   03/19/16 1504 03/19/16 2229  Weight: 81.6 kg (180 lb) 80.4 kg (177 lb 4 oz)    Examination: General exam: 70 y.o. female in no distress Respiratory system: Non-labored breathing room air. Clear to auscultation bilaterally.  Cardiovascular system: Irregular in 100-110's. No murmur, rub, or gallop. No JVD, and no pedal edema. Gastrointestinal system: Abdomen soft, non-tender, non-distended, with normoactive bowel sounds. No organomegaly or masses felt. Central nervous system: Alert and oriented. No focal neurological deficits. Extremities: Warm, no deformities Skin: No rashes, or bleeding. Diffuse pallor.  Psychiatry: Judgement and insight appear normal. Mood & affect appropriate.   Data Reviewed: I have personally reviewed following labs and imaging studies  CBC:  Recent Labs Lab 03/19/16 1740 03/20/16 0518  WBC 11.2* 11.6*  NEUTROABS 8.9*  --   HGB 7.7* 7.0*  HCT 26.8* 24.7*  MCV 71.7* 72.4*  PLT 345 99991111   Basic Metabolic Panel:  Recent Labs Lab 03/19/16 1740 03/20/16 0518  NA 140 142  K 4.0 3.7  CL 106 108  CO2 21* 25  GLUCOSE 67 77  BUN 16 16  CREATININE 0.80 0.67  CALCIUM 8.8* 9.1   GFR: Estimated Creatinine Clearance: 70.4 mL/min (by C-G formula based on SCr of 0.67 mg/dL). Liver Function Tests: No results for input(s): AST, ALT, ALKPHOS, BILITOT, PROT, ALBUMIN in the last 168 hours. No results  for input(s): LIPASE, AMYLASE in the last 168 hours. No results for input(s): AMMONIA in the last 168 hours. Coagulation Profile: No results for input(s): INR, PROTIME in the last 168 hours. Cardiac Enzymes: No results for input(s): CKTOTAL, CKMB, CKMBINDEX, TROPONINI in the last 168 hours. BNP (last 3 results) No results for input(s): PROBNP in the last 8760 hours. HbA1C: No results for input(s): HGBA1C in the last 72 hours. CBG:  Recent Labs Lab 03/20/16 0130 03/20/16 0313 03/20/16 0429 03/20/16 0601 03/20/16 0756  GLUCAP 71 126* 70 73 71   Lipid Profile: No results for input(s): CHOL, HDL, LDLCALC, TRIG, CHOLHDL, LDLDIRECT in the last 72 hours. Thyroid Function Tests: No results for input(s): TSH, T4TOTAL, FREET4, T3FREE, THYROIDAB in the last 72 hours. Anemia Panel:  Recent Labs  03/20/16 0518  FERRITIN 19  TIBC 378  IRON 9*   Urine analysis:    Component Value Date/Time   COLORURINE YELLOW 10/09/2014 0040   APPEARANCEUR CLEAR 10/09/2014 0040  LABSPEC 1.030 10/09/2014 0040   PHURINE 6.0 10/09/2014 0040   GLUCOSEU 100 (A) 10/09/2014 0040   HGBUR NEGATIVE 10/09/2014 0040   BILIRUBINUR NEGATIVE 10/09/2014 0040   KETONESUR NEGATIVE 10/09/2014 0040   PROTEINUR 30 (A) 10/09/2014 0040   UROBILINOGEN 1.0 10/09/2014 0040   NITRITE NEGATIVE 10/09/2014 0040   LEUKOCYTESUR NEGATIVE 10/09/2014 0040   Sepsis Labs: @LABRCNTIP (procalcitonin:4,lacticidven:4)  )No results found for this or any previous visit (from the past 240 hour(s)).   Radiology Studies: Dg Chest 2 View  Result Date: 03/19/2016 CLINICAL DATA:  Shortness of Breath EXAM: CHEST  2 VIEW COMPARISON:  02/21/2015 FINDINGS: Heart is upper limits normal in size. Mild peribronchial thickening. No confluent opacities or effusions. No acute bony abnormality. IMPRESSION: Mild bronchitic changes. Electronically Signed   By: Rolm Baptise M.D.   On: 03/19/2016 15:46    Scheduled Meds: . buPROPion  150 mg Oral BID   . cholecalciferol  1,000 Units Oral Daily  . diltiazem  240 mg Oral Daily  . ferrous sulfate  325 mg Oral QODAY  . furosemide  40 mg Oral Daily  . insulin aspart  0-5 Units Subcutaneous QHS  . insulin aspart  0-9 Units Subcutaneous TID WC  . leflunomide  20 mg Oral Daily  . metFORMIN  1,000 mg Oral BID WC  . metoprolol  75 mg Oral BID  . multivitamin   Oral BID  . omega-3 acid ethyl esters  1 g Oral Daily  . pantoprazole  80 mg Oral Q1200  . potassium chloride  20 mEq Oral BID  . predniSONE  5 mg Oral Daily  . rivaroxaban  20 mg Oral Q supper  . rosuvastatin  10 mg Oral Daily  . sodium chloride flush  3 mL Intravenous Q12H   Continuous Infusions: . dextrose 5 % and 0.9% NaCl 50 mL/hr at 03/20/16 0237     LOS: 0 days   Time spent: 25 minutes.  Christina Gather, MD [facility] Pager [phone]  If 7PM-7AM, please contact night-coverage [email or web address] Password [email or web address] 03/20/2016, 12:11 PM

## 2016-03-21 ENCOUNTER — Encounter (HOSPITAL_COMMUNITY)
Admission: EM | Disposition: A | Discharge status: Home / Self Care | Payer: Self-pay | Source: Home / Self Care | Attending: Family Medicine

## 2016-03-21 DIAGNOSIS — I4891 Unspecified atrial fibrillation: Secondary | ICD-10-CM

## 2016-03-21 HISTORY — PX: GIVENS CAPSULE STUDY: SHX5432

## 2016-03-21 LAB — TYPE AND SCREEN
ABO/RH(D): O NEG
Antibody Screen: NEGATIVE
Unit division: 0
Unit division: 0

## 2016-03-21 LAB — CBC
HCT: 32.1 % — ABNORMAL LOW (ref 36.0–46.0)
HEMOGLOBIN: 9.6 g/dL — AB (ref 12.0–15.0)
MCH: 22.5 pg — AB (ref 26.0–34.0)
MCHC: 29.9 g/dL — AB (ref 30.0–36.0)
MCV: 75.2 fL — ABNORMAL LOW (ref 78.0–100.0)
Platelets: 278 10*3/uL (ref 150–400)
RBC: 4.27 MIL/uL (ref 3.87–5.11)
RDW: 20.3 % — AB (ref 11.5–15.5)
WBC: 9.8 10*3/uL (ref 4.0–10.5)

## 2016-03-21 LAB — BASIC METABOLIC PANEL
Anion gap: 10 (ref 5–15)
BUN: 10 mg/dL (ref 6–20)
CALCIUM: 8.8 mg/dL — AB (ref 8.9–10.3)
CHLORIDE: 108 mmol/L (ref 101–111)
CO2: 25 mmol/L (ref 22–32)
Creatinine, Ser: 0.56 mg/dL (ref 0.44–1.00)
GFR calc Af Amer: >60 mL/min (ref >60)
GFR calc non Af Amer: >60 mL/min (ref >60)
Glucose, Bld: 55 mg/dL — ABNORMAL LOW (ref 65–99)
Potassium: 3.3 mmol/L — ABNORMAL LOW (ref 3.5–5.1)
SODIUM: 143 mmol/L (ref 135–145)

## 2016-03-21 LAB — GLUCOSE, CAPILLARY
GLUCOSE-CAPILLARY: 85 mg/dL (ref 65–99)
Glucose-Capillary: 124 mg/dL — ABNORMAL HIGH (ref 65–99)
Glucose-Capillary: 216 mg/dL — ABNORMAL HIGH (ref 65–99)

## 2016-03-21 SURGERY — IMAGING PROCEDURE, GI TRACT, INTRALUMINAL, VIA CAPSULE

## 2016-03-21 MED ORDER — BENZONATATE 100 MG PO CAPS: 100.0000 mg | ORAL_CAPSULE | Freq: Three times a day (TID) | ORAL | Status: DC | PRN

## 2016-03-21 MED ORDER — SODIUM CHLORIDE 0.9 % IV SOLN: INTRAVENOUS | Status: DC

## 2016-03-21 MED ORDER — ACETAMINOPHEN 325 MG PO TABS: 650.0000 mg | ORAL_TABLET | Freq: Four times a day (QID) | ORAL | Status: DC | PRN

## 2016-03-21 SURGICAL SUPPLY — 1 items: TOWEL COTTON PACK 4EA (MISCELLANEOUS) ×6 IMPLANT

## 2016-03-21 NOTE — Progress Notes (Signed)
PROGRESS NOTE  [name]  [id number] DOB: 1945/12/20 DOA: 03/19/2016 PCP: Tula Nakayama  Outpatient Specialists: Dr. Benson Norway, Gastroenterology Dr. Stanford Breed, Cardiology Dr. Marin Olp, Hematology  Brief Narrative: [name] is a 70 y.o. female with medical history significant of DM2, RA, chronic DVT in the left leg, iron deficiency anemia, HTN, HLD, GERD, Afib on xarelto, CKD who presents for SOB on exertion worsening over past 3 weeks associated with fatigue. Denied chest pain, any signs/symptoms of bleeding. On arrival she appeared fatigued but not hypoxic, afebrile. +FOBT and hgb had dropped to 7.7 from 9.5, so she was admitted for transfusion and further evaluation. Transfusion still ongoing, though hgb dropped to 7.0 and HR climbing to AFib w/110s-120s. GI was consulted and is planning capsule endoscopy 12/18.   Assessment & Plan: Active Problems:   Hypertension   Hypercholesterolemia   DM2 (diabetes mellitus, type 2) (HCC)   Rheumatoid arthritis ([place])   Acute deep vein thrombosis (DVT) of left lower extremity (HCC)   A-fib (HCC)   Iron deficiency anemia due to chronic blood loss   Anemia of chronic renal failure, stage 3 (moderate)   Symptomatic anemia   Shortness of breath  Dyspnea: Anemia likely cause of dyspnea and increased AFib rate which is likely contributing to dyspnea. Improving per my discussion with patient.  Symptomatic anemia: Hgb 7.7 > 7.0 on admission from last hgb 9.5. +FOBT. ?Acute vs. chronic GI bleed as she's history of cecal AVM on Cscope Nov 2016. EGD neg at that time.  - We've continued xarelto for chronic DVT and permanent AFib, has failed pradaxa. ?consider eliquis.  - Appreciate GI recommendations: capsule planned 12/18. Await recommendation recommendations from GI  Permanent AFib: Followed by Dr. Stanford Breed.  - Anticipate improvement in rate with correction of anemia. If not < 110bpm, will titrate up rate control meds (diltiazem,  metoprolol) - Continue xarelto    Iron deficiency anemia: Chronic, has gotten iron infusions in the past, but not recently, taking iron po QOD limited due to constipation. - Iron 9, %sat: 2, ferritin 19.  - s/p IV iron transfusion - Continue po iron as tolerated. - Trend CBC  Hypertension - BP 110s to 140s while in the ED - Continue diltiazem and metoprolol which are also for rate control - Continue lasix and KCl supplementation - last echo showed preserved EF, pt euvolemic.     Hypercholesterolemia - Continue rosuvastatin    T2DM: Last HbA1c 6.0%. Will hold metformin and sulfonylurea - Sensitive SSI    Rheumatoid arthritis: Chronic, stable - Continue leflunomide and chronic steroids (prednisone 5mg ), suspect leukocytosis due to this.  - Recommend stopping NSAIDs with ?GI bleed    Chronic deep vein thrombosis (DVT) of left lower extremity (HCC) - No indication for repeat ultrasound of LLE at this time, swelling is stable/diminished per pt. - Continue Xarelto.   DVT prophylaxis: Xarelto Code Status: Full Family Communication: None at bedside Disposition Plan: Anticipate discharge home in next 24 hours.   Consultants:   Gastroenterology, Dr. Silverio Decamp and Dr. Benson Norway  Procedures:   None  Antimicrobials:  None   Subjective: Pt  Has no new complaints for me.  Objective: Vitals:   03/21/16 0454 03/21/16 0944 03/21/16 1224 03/21/16 1430  BP: 120/75   128/76  Pulse: (!) 110 (!) 110  (!) 106  Resp: 16   18  Temp: 97.7 F (36.5 C)   98.1 F (36.7 C)  TempSrc: Oral   Oral  SpO2: 97%   100%  Weight:   80.3 kg (177 lb)   Height:   5\' 7"  (1.702 m)    No intake or output data in the 24 hours ending 03/21/16 1743 Filed Weights   03/19/16 1504 03/19/16 2229 03/21/16 1224  Weight: 81.6 kg (180 lb) 80.4 kg (177 lb 4 oz) 80.3 kg (177 lb)    Examination: General exam: 70 y.o. female , in nad. Respiratory system: equal chest rise, no wheezes, Clear to auscultation  bilaterally.  Cardiovascular system: Irregular in 100-110's. No murmur, rub, or gallop. No JVD, and no pedal edema. Gastrointestinal system: Abdomen soft, non-tender, non-distended, with normoactive bowel sounds. No organomegaly or masses felt. Central nervous system: Alert and oriented. No focal neurological deficits. Extremities: Warm, no deformities Skin: No rashes, or bleeding. Diffuse pallor.  Psychiatry: Judgement and insight appear normal. Mood & affect appropriate.   Data Reviewed: I have personally reviewed following labs and imaging studies  CBC:  Recent Labs Lab 03/19/16 1740 03/20/16 0518 03/20/16 1306 03/21/16 0348  WBC 11.2* 11.6* 11.9* 9.8  NEUTROABS 8.9*  --   --   --   HGB 7.7* 7.0* 9.6* 9.6*  HCT 26.8* 24.7* 32.4* 32.1*  MCV 71.7* 72.4* 75.2* 75.2*  PLT 345 310 305 0000000   Basic Metabolic Panel:  Recent Labs Lab 03/19/16 1740 03/20/16 0518 03/21/16 0348  NA 140 142 143  K 4.0 3.7 3.3*  CL 106 108 108  CO2 21* 25 25  GLUCOSE 67 77 55*  BUN 16 16 10   CREATININE 0.80 0.67 0.56  CALCIUM 8.8* 9.1 8.8*   GFR: Estimated Creatinine Clearance: 70.4 mL/min (by C-G formula based on SCr of 0.56 mg/dL). Liver Function Tests: No results for input(s): AST, ALT, ALKPHOS, BILITOT, PROT, ALBUMIN in the last 168 hours. No results for input(s): LIPASE, AMYLASE in the last 168 hours. No results for input(s): AMMONIA in the last 168 hours. Coagulation Profile: No results for input(s): INR, PROTIME in the last 168 hours. Cardiac Enzymes: No results for input(s): CKTOTAL, CKMB, CKMBINDEX, TROPONINI in the last 168 hours. BNP (last 3 results) No results for input(s): PROBNP in the last 8760 hours. HbA1C: No results for input(s): HGBA1C in the last 72 hours. CBG:  Recent Labs Lab 03/20/16 1652 03/20/16 2114 03/21/16 0752 03/21/16 1135 03/21/16 1705  GLUCAP 152* 170* 85 124* 216*   Lipid Profile: No results for input(s): CHOL, HDL, LDLCALC, TRIG, CHOLHDL,  LDLDIRECT in the last 72 hours. Thyroid Function Tests: No results for input(s): TSH, T4TOTAL, FREET4, T3FREE, THYROIDAB in the last 72 hours. Anemia Panel:  Recent Labs  03/20/16 0518  FERRITIN 19  TIBC 378  IRON 9*   Urine analysis:    Component Value Date/Time   COLORURINE YELLOW 10/09/2014 0040   APPEARANCEUR CLEAR 10/09/2014 0040   LABSPEC 1.030 10/09/2014 0040   PHURINE 6.0 10/09/2014 0040   GLUCOSEU 100 (A) 10/09/2014 0040   HGBUR NEGATIVE 10/09/2014 0040   BILIRUBINUR NEGATIVE 10/09/2014 0040   KETONESUR NEGATIVE 10/09/2014 0040   PROTEINUR 30 (A) 10/09/2014 0040   UROBILINOGEN 1.0 10/09/2014 0040   NITRITE NEGATIVE 10/09/2014 0040   LEUKOCYTESUR NEGATIVE 10/09/2014 0040   Sepsis Labs: @LABRCNTIP (procalcitonin:4,lacticidven:4)  )No results found for this or any previous visit (from the past 240 hour(s)).   Radiology Studies: No results found.  Scheduled Meds: . buPROPion  150 mg Oral BID  . cholecalciferol  1,000 Units Oral Daily  . diltiazem  240 mg Oral Daily  . ferrous sulfate  325 mg Oral QODAY  .  furosemide  40 mg Oral Daily  . leflunomide  20 mg Oral Daily  . metoprolol  75 mg Oral BID  . multivitamin   Oral BID  . omega-3 acid ethyl esters  1 g Oral Daily  . pantoprazole  80 mg Oral Q1200  . potassium chloride  20 mEq Oral BID  . predniSONE  5 mg Oral Daily  . rivaroxaban  20 mg Oral Q supper  . rosuvastatin  10 mg Oral Daily  . sodium chloride flush  3 mL Intravenous Q12H   Continuous Infusions:    LOS: 1 day   Time spent: 25 minutes.  Christina Bathe, MD [facility] Pager [phone]  If 7PM-7AM, please contact night-coverage [email or web address] Password [facility] 03/21/2016, 5:43 PM

## 2016-03-21 NOTE — Plan of Care (Signed)
  Problem: Pain Managment: Goal: General experience of comfort will improve Outcome: Adequate for Discharge   

## 2016-03-22 DIAGNOSIS — I82492 Acute embolism and thrombosis of other specified deep vein of left lower extremity: Secondary | ICD-10-CM

## 2016-03-22 LAB — CBC
HEMATOCRIT: 32.8 % — AB (ref 36.0–46.0)
Hemoglobin: 9.6 g/dL — ABNORMAL LOW (ref 12.0–15.0)
MCH: 22.4 pg — ABNORMAL LOW (ref 26.0–34.0)
MCHC: 29.3 g/dL — ABNORMAL LOW (ref 30.0–36.0)
MCV: 76.5 fL — AB (ref 78.0–100.0)
Platelets: 296 10*3/uL (ref 150–400)
RBC: 4.29 MIL/uL (ref 3.87–5.11)
RDW: 21.6 % — AB (ref 11.5–15.5)
WBC: 11.1 10*3/uL — AB (ref 4.0–10.5)

## 2016-03-22 LAB — GLUCOSE, CAPILLARY: Glucose-Capillary: 196 mg/dL — ABNORMAL HIGH (ref 65–99)

## 2016-03-22 MED ORDER — SODIUM CHLORIDE 0.9 % IV SOLN
INTRAVENOUS | Status: DC
  Administered 2016-03-23: 500 mL via INTRAVENOUS

## 2016-03-22 MED ORDER — SODIUM CHLORIDE 0.9 % IV SOLN: INTRAVENOUS | Status: DC

## 2016-03-22 NOTE — Progress Notes (Signed)
PROGRESS NOTE  [name]  [id number] DOB: 1945-09-04 DOA: 03/19/2016 PCP: Tula Nakayama  Outpatient Specialists: Dr. Benson Norway, Gastroenterology Dr. Stanford Breed, Cardiology Dr. Marin Olp, Hematology  Brief Narrative: [name] is a 70 y.o. female with medical history significant of DM2, RA, chronic DVT in the left leg, iron deficiency anemia, HTN, HLD, GERD, Afib on xarelto, CKD who presents for SOB on exertion worsening over past 3 weeks associated with fatigue. Denied chest pain, any signs/symptoms of bleeding. On arrival she appeared fatigued but not hypoxic, afebrile. +FOBT and hgb had dropped to 7.7 from 9.5, so she was admitted for transfusion and further evaluation. Transfusion still ongoing, though hgb dropped to 7.0 and HR climbing to AFib w/110s-120s. GI was consulted and is planning capsule endoscopy 12/18.   Assessment & Plan: Active Problems:   Hypertension   Hypercholesterolemia   DM2 (diabetes mellitus, type 2) (HCC)   Rheumatoid arthritis ([place])   Acute deep vein thrombosis (DVT) of left lower extremity (HCC)   A-fib (HCC)   Iron deficiency anemia due to chronic blood loss   Anemia of chronic renal failure, stage 3 (moderate)   Symptomatic anemia   Shortness of breath  Dyspnea:  - secondary to anemia, improved after transfusion and improved HR control  Symptomatic anemia: Hgb 7.7 > 7.0 on admission from last hgb 9.5. +FOBT. ?Acute vs. chronic GI bleed as she's history of cecal AVM on Cscope Nov 2016. EGD neg at that time.  - Awaiting further recommendations from GI specialist.  Permanent AFib: Followed by Dr. Stanford Breed.  - continue current rate controlling agents. - Continue xarelto    Iron deficiency anemia: Chronic, has gotten iron infusions in the past, but not recently, taking iron po QOD limited due to constipation. - Iron 9, %sat: 2, ferritin 19.  - s/p IV iron transfusion - Continue po iron as tolerated. - Trend CBC  Hypertension - BP 110s to  140s while in the ED - Continue diltiazem and metoprolol which are also for rate control - Continue lasix and KCl supplementation - last echo showed preserved EF, pt euvolemic.     Hypercholesterolemia - Continue rosuvastatin    T2DM: Last HbA1c 6.0%. Will hold metformin and sulfonylurea - Sensitive SSI    Rheumatoid arthritis: Chronic, stable - Continue leflunomide and chronic steroids (prednisone 5mg ), suspect leukocytosis due to this.  - Recommend stopping NSAIDs with ?GI bleed    Chronic deep vein thrombosis (DVT) of left lower extremity (HCC) - No indication for repeat ultrasound of LLE at this time, swelling is stable/diminished per pt. - Continue Xarelto.   DVT prophylaxis: Xarelto Code Status: Full Family Communication: None at bedside Disposition Plan: Anticipate discharge home in next 24 hours.   Consultants:   Gastroenterology, Dr. Silverio Decamp and Dr. Benson Norway  Procedures:   None  Antimicrobials:  None   Subjective: Pt reports no more bleeding. No new complaints.  Objective: Vitals:   03/21/16 1430 03/21/16 2256 03/22/16 0628 03/22/16 0933  BP: 128/76 108/61 125/82 126/80  Pulse: (!) 106 84 71 75  Resp: 18 16 19    Temp: 98.1 F (36.7 C) 97.4 F (36.3 C) 97.8 F (36.6 C)   TempSrc: Oral Oral Oral   SpO2: 100% 100% 100%   Weight:      Height:        Intake/Output Summary (Last 24 hours) at 03/22/16 1450 Last data filed at 03/22/16 0600  Gross per 24 hour  Intake  660 ml  Output                0 ml  Net              660 ml   Filed Weights   03/19/16 1504 03/19/16 2229 03/21/16 1224  Weight: 81.6 kg (180 lb) 80.4 kg (177 lb 4 oz) 80.3 kg (177 lb)    Examination: General exam: 70 y.o. female , in nad. Respiratory system: equal chest rise, no wheezes, Clear to auscultation bilaterally.  Cardiovascular system: Irregular in 100-110's. No murmur, rub, or gallop. No JVD, and no pedal edema. Gastrointestinal system: Abdomen soft, non-tender,  non-distended, with normoactive bowel sounds. No organomegaly or masses felt. Central nervous system: Alert and oriented. No focal neurological deficits. Extremities: Warm, no deformities Skin: No rashes, or bleeding. Diffuse pallor.  Psychiatry: Judgement and insight appear normal. Mood & affect appropriate.   Data Reviewed: I have personally reviewed following labs and imaging studies  CBC:  Recent Labs Lab 03/19/16 1740 03/20/16 0518 03/20/16 1306 03/21/16 0348 03/22/16 0448  WBC 11.2* 11.6* 11.9* 9.8 11.1*  NEUTROABS 8.9*  --   --   --   --   HGB 7.7* 7.0* 9.6* 9.6* 9.6*  HCT 26.8* 24.7* 32.4* 32.1* 32.8*  MCV 71.7* 72.4* 75.2* 75.2* 76.5*  PLT 345 310 305 278 0000000   Basic Metabolic Panel:  Recent Labs Lab 03/19/16 1740 03/20/16 0518 03/21/16 0348  NA 140 142 143  K 4.0 3.7 3.3*  CL 106 108 108  CO2 21* 25 25  GLUCOSE 67 77 55*  BUN 16 16 10   CREATININE 0.80 0.67 0.56  CALCIUM 8.8* 9.1 8.8*   GFR: Estimated Creatinine Clearance: 70.4 mL/min (by C-G formula based on SCr of 0.56 mg/dL). Liver Function Tests: No results for input(s): AST, ALT, ALKPHOS, BILITOT, PROT, ALBUMIN in the last 168 hours. No results for input(s): LIPASE, AMYLASE in the last 168 hours. No results for input(s): AMMONIA in the last 168 hours. Coagulation Profile: No results for input(s): INR, PROTIME in the last 168 hours. Cardiac Enzymes: No results for input(s): CKTOTAL, CKMB, CKMBINDEX, TROPONINI in the last 168 hours. BNP (last 3 results) No results for input(s): PROBNP in the last 8760 hours. HbA1C: No results for input(s): HGBA1C in the last 72 hours. CBG:  Recent Labs Lab 03/20/16 1652 03/20/16 2114 03/21/16 0752 03/21/16 1135 03/21/16 1705  GLUCAP 152* 170* 85 124* 216*   Lipid Profile: No results for input(s): CHOL, HDL, LDLCALC, TRIG, CHOLHDL, LDLDIRECT in the last 72 hours. Thyroid Function Tests: No results for input(s): TSH, T4TOTAL, FREET4, T3FREE, THYROIDAB in  the last 72 hours. Anemia Panel:  Recent Labs  03/20/16 0518  FERRITIN 19  TIBC 378  IRON 9*   Urine analysis:    Component Value Date/Time   COLORURINE YELLOW 10/09/2014 0040   APPEARANCEUR CLEAR 10/09/2014 0040   LABSPEC 1.030 10/09/2014 0040   PHURINE 6.0 10/09/2014 0040   GLUCOSEU 100 (A) 10/09/2014 0040   HGBUR NEGATIVE 10/09/2014 0040   BILIRUBINUR NEGATIVE 10/09/2014 0040   KETONESUR NEGATIVE 10/09/2014 0040   PROTEINUR 30 (A) 10/09/2014 0040   UROBILINOGEN 1.0 10/09/2014 0040   NITRITE NEGATIVE 10/09/2014 0040   LEUKOCYTESUR NEGATIVE 10/09/2014 0040   Sepsis Labs: @LABRCNTIP (procalcitonin:4,lacticidven:4)  )No results found for this or any previous visit (from the past 240 hour(s)).   Radiology Studies: No results found.  Scheduled Meds: . buPROPion  150 mg Oral BID  . cholecalciferol  1,000 Units  Oral Daily  . diltiazem  240 mg Oral Daily  . ferrous sulfate  325 mg Oral QODAY  . furosemide  40 mg Oral Daily  . leflunomide  20 mg Oral Daily  . metoprolol  75 mg Oral BID  . multivitamin   Oral BID  . omega-3 acid ethyl esters  1 g Oral Daily  . pantoprazole  80 mg Oral Q1200  . potassium chloride  20 mEq Oral BID  . predniSONE  5 mg Oral Daily  . rivaroxaban  20 mg Oral Q supper  . rosuvastatin  10 mg Oral Daily  . sodium chloride flush  3 mL Intravenous Q12H   Continuous Infusions:    LOS: 2 days   Time spent: 25 minutes.  Velvet Bathe, MD [facility] Pager [phone]  If 7PM-7AM, please contact night-coverage [email or web address] Password [email or web address] 03/22/2016, 2:50 PM

## 2016-03-22 NOTE — Progress Notes (Signed)
Subjective: No acute events.  Objective: Vital signs in last 24 hours: Temp:  [97.4 F (36.3 C)-97.8 F (36.6 C)] 97.8 F (36.6 C) (12/19 0628) Pulse Rate:  [71-84] 84 (12/19 1632) Resp:  [16-20] 20 (12/19 1632) BP: (108-148)/(61-82) 148/64 (12/19 1632) SpO2:  [98 %-100 %] 98 % (12/19 1632) Last BM Date: 03/21/16  Intake/Output from previous day: 12/18 0701 - 12/19 0700 In: 660 [P.O.:660] Out: -  Intake/Output this shift: No intake/output data recorded.  General appearance: alert and no distress GI: soft, non-tender; bowel sounds normal; no masses,  no organomegaly  Lab Results:  Recent Labs  03/20/16 1306 03/21/16 0348 03/22/16 0448  WBC 11.9* 9.8 11.1*  HGB 9.6* 9.6* 9.6*  HCT 32.4* 32.1* 32.8*  PLT 305 278 296   BMET  Recent Labs  03/19/16 1740 03/20/16 0518 03/21/16 0348  NA 140 142 143  K 4.0 3.7 3.3*  CL 106 108 108  CO2 21* 25 25  GLUCOSE 67 77 55*  BUN 16 16 10   CREATININE 0.80 0.67 0.56  CALCIUM 8.8* 9.1 8.8*   LFT No results for input(s): PROT, ALBUMIN, AST, ALT, ALKPHOS, BILITOT, BILIDIR, IBILI in the last 72 hours. PT/INR No results for input(s): LABPROT, INR in the last 72 hours. Hepatitis Panel No results for input(s): HEPBSAG, HCVAB, HEPAIGM, HEPBIGM in the last 72 hours. C-Diff No results for input(s): CDIFFTOX in the last 72 hours. Fecal Lactopherrin No results for input(s): FECLLACTOFRN in the last 72 hours.  Studies/Results: No results found.  Medications:  Scheduled: . buPROPion  150 mg Oral BID  . cholecalciferol  1,000 Units Oral Daily  . diltiazem  240 mg Oral Daily  . ferrous sulfate  325 mg Oral QODAY  . furosemide  40 mg Oral Daily  . leflunomide  20 mg Oral Daily  . metoprolol  75 mg Oral BID  . multivitamin   Oral BID  . omega-3 acid ethyl esters  1 g Oral Daily  . pantoprazole  80 mg Oral Q1200  . potassium chloride  20 mEq Oral BID  . predniSONE  5 mg Oral Daily  . rivaroxaban  20 mg Oral Q supper  .  rosuvastatin  10 mg Oral Daily  . sodium chloride flush  3 mL Intravenous Q12H   Continuous: . sodium chloride      Assessment/Plan: 1) Bleeding proximal small bowel AVM. 2) Anemia.   The capsule endoscopy was positive for an actively bleeding jejunal AVM.  Hopefully I will be able to reach and ablate the lesion with the enteroscopy tomorrow.  Plan: 1) Enteroscopy with APC.  LOS: 2 days   Brieonna Crutcher D 03/22/2016, 4:59 PM

## 2016-03-23 ENCOUNTER — Encounter (HOSPITAL_COMMUNITY)
Admission: EM | Disposition: A | Discharge status: Home / Self Care | Payer: Self-pay | Source: Home / Self Care | Attending: Family Medicine

## 2016-03-23 HISTORY — PX: ENTEROSCOPY: SHX5533

## 2016-03-23 LAB — GLUCOSE, CAPILLARY: GLUCOSE-CAPILLARY: 128 mg/dL — AB (ref 65–99)

## 2016-03-23 SURGERY — ENTEROSCOPY
Anesthesia: Moderate Sedation

## 2016-03-23 MED ORDER — MIDAZOLAM HCL 5 MG/ML IJ SOLN
INTRAMUSCULAR | Status: AC
  Filled 2016-03-23: qty 2

## 2016-03-23 MED ORDER — FENTANYL CITRATE (PF) 100 MCG/2ML IJ SOLN
INTRAMUSCULAR | Status: DC | PRN
  Administered 2016-03-23 (×3): 25 ug via INTRAVENOUS

## 2016-03-23 MED ORDER — MIDAZOLAM HCL 10 MG/2ML IJ SOLN
INTRAMUSCULAR | Status: DC | PRN
  Administered 2016-03-23: 1 mg via INTRAVENOUS
  Administered 2016-03-23 (×2): 2 mg via INTRAVENOUS

## 2016-03-23 MED ORDER — FENTANYL CITRATE (PF) 100 MCG/2ML IJ SOLN
INTRAMUSCULAR | Status: AC
  Filled 2016-03-23: qty 2

## 2016-03-23 NOTE — Op Note (Signed)
[facility] Patient Name: [name] Procedure Date: 03/23/2016 MRN: [id number] Attending MD: Carol Ada , MD Date of Birth: 11-12-1945 CSN: [id number] Age: 70 Admit Type: Inpatient Procedure:                Small bowel enteroscopy Indications:              Arteriovenous malformation in the small intestine Providers:                Carol Ada, MD, Burtis Junes, RN, Alfonso Patten,                            Technician Referring MD:              Medicines:                Midazolam 4 mg IV, Fentanyl 50 micrograms IV Complications:            No immediate complications. Estimated Blood Loss:     Estimated blood loss: none. Procedure:                Pre-Anesthesia Assessment:                           - Prior to the procedure, a History and Physical                            was performed, and patient medications and                            allergies were reviewed. The patient's tolerance of                            previous anesthesia was also reviewed. The risks                            and benefits of the procedure and the sedation                            options and risks were discussed with the patient.                            All questions were answered, and informed consent                            was obtained. Prior Anticoagulants: The patient has                            taken no previous anticoagulant or antiplatelet                            agents. ASA Grade Assessment: II - A patient with                            mild systemic disease. After reviewing the risks  and benefits, the patient was deemed in                            satisfactory condition to undergo the procedure.                           - Sedation was administered by an endoscopy nurse.                            The sedation level attained was moderate.                           After obtaining informed consent, the endoscope was             passed under direct vision. Throughout the                            procedure, the patient's blood pressure, pulse, and                            oxygen saturations were monitored continuously. The                            Colonoscope was introduced through the mouth and                            advanced to the small bowel distal to the Ligament                            of Treitz. The small bowel enteroscopy was                            accomplished without difficulty. The patient                            tolerated the procedure well. Scope In: Scope Out: Findings:      The esophagus was normal.      One non-bleeding superficial gastric ulcer with no stigmata of bleeding       was found at the incisura. The lesion was 4 mm in largest dimension.      The examined duodenum was normal.      There was no evidence of significant pathology in the entire examined       portion of jejunum.      Deep intubation of the proximal jejunum was achieved, twice. I was not       able to identify any AVMs or any evidence of bleeding. The AVM may not       be bleeding during this examination or I was not able to reach the site. Impression:               - Normal esophagus.                           - Non-bleeding gastric ulcer with no stigmata of  bleeding.                           - Normal examined duodenum.                           - The examined portion of the jejunum was normal.                           - No specimens collected. Recommendation:           - Return patient to hospital ward for ongoing care.                           - Resume regular diet.                           - Continue present medications.                           - Return to GI clinic in 2 weeks. Procedure Code(s):        --- Professional ---                           [id number], Small intestinal endoscopy, enteroscopy                            beyond second portion of duodenum,  not including                            ileum; diagnostic, including collection of                            specimen(s) by brushing or washing, when performed                            (separate procedure) Diagnosis Code(s):        --- Professional ---                           K25.9, Gastric ulcer, unspecified as acute or                            chronic, without hemorrhage or perforation                           Q27.33, Arteriovenous malformation of digestive                            system vessel CPT copyright 2016 [employer]. All rights reserved. The codes documented in this report are preliminary and upon coder review may  be revised to meet current compliance requirements. Carol Ada, MD Carol Ada, MD 03/23/2016 12:45:37 PM This report has been signed electronically. Number of Addenda: 0

## 2016-03-23 NOTE — Interval H&P Note (Signed)
History and Physical Interval Note:  03/23/2016 12:00 PM  [name]  has presented today for surgery, with the diagnosis of Bleeding small bowel AVM.  The various methods of treatment have been discussed with the patient and family. After consideration of risks, benefits and other options for treatment, the patient has consented to  Procedure(s): ENTEROSCOPY (N/A) as a surgical intervention .  The patient's history has been reviewed, patient examined, no change in status, stable for surgery.  I have reviewed the patient's chart and labs.  Questions were answered to the patient's satisfaction.     Gayathri Futrell D

## 2016-03-23 NOTE — H&P (View-Only) (Signed)
Subjective: No acute events.  Objective: Vital signs in last 24 hours: Temp:  [97.4 F (36.3 C)-97.8 F (36.6 C)] 97.8 F (36.6 C) (12/19 0628) Pulse Rate:  [71-84] 84 (12/19 1632) Resp:  [16-20] 20 (12/19 1632) BP: (108-148)/(61-82) 148/64 (12/19 1632) SpO2:  [98 %-100 %] 98 % (12/19 1632) Last BM Date: 03/21/16  Intake/Output from previous day: 12/18 0701 - 12/19 0700 In: 660 [P.O.:660] Out: -  Intake/Output this shift: No intake/output data recorded.  General appearance: alert and no distress GI: soft, non-tender; bowel sounds normal; no masses,  no organomegaly  Lab Results:  Recent Labs  03/20/16 1306 03/21/16 0348 03/22/16 0448  WBC 11.9* 9.8 11.1*  HGB 9.6* 9.6* 9.6*  HCT 32.4* 32.1* 32.8*  PLT 305 278 296   BMET  Recent Labs  03/19/16 1740 03/20/16 0518 03/21/16 0348  NA 140 142 143  K 4.0 3.7 3.3*  CL 106 108 108  CO2 21* 25 25  GLUCOSE 67 77 55*  BUN 16 16 10   CREATININE 0.80 0.67 0.56  CALCIUM 8.8* 9.1 8.8*   LFT No results for input(s): PROT, ALBUMIN, AST, ALT, ALKPHOS, BILITOT, BILIDIR, IBILI in the last 72 hours. PT/INR No results for input(s): LABPROT, INR in the last 72 hours. Hepatitis Panel No results for input(s): HEPBSAG, HCVAB, HEPAIGM, HEPBIGM in the last 72 hours. C-Diff No results for input(s): CDIFFTOX in the last 72 hours. Fecal Lactopherrin No results for input(s): FECLLACTOFRN in the last 72 hours.  Studies/Results: No results found.  Medications:  Scheduled: . buPROPion  150 mg Oral BID  . cholecalciferol  1,000 Units Oral Daily  . diltiazem  240 mg Oral Daily  . ferrous sulfate  325 mg Oral QODAY  . furosemide  40 mg Oral Daily  . leflunomide  20 mg Oral Daily  . metoprolol  75 mg Oral BID  . multivitamin   Oral BID  . omega-3 acid ethyl esters  1 g Oral Daily  . pantoprazole  80 mg Oral Q1200  . potassium chloride  20 mEq Oral BID  . predniSONE  5 mg Oral Daily  . rivaroxaban  20 mg Oral Q supper  .  rosuvastatin  10 mg Oral Daily  . sodium chloride flush  3 mL Intravenous Q12H   Continuous: . sodium chloride      Assessment/Plan: 1) Bleeding proximal small bowel AVM. 2) Anemia.   The capsule endoscopy was positive for an actively bleeding jejunal AVM.  Hopefully I will be able to reach and ablate the lesion with the enteroscopy tomorrow.  Plan: 1) Enteroscopy with APC.  LOS: 2 days   Treavor Blomquist D 03/22/2016, 4:59 PM

## 2016-03-23 NOTE — Discharge Summary (Signed)
Physician Discharge Summary  [name] [id number] DOB: 1945-12-17 DOA: 03/19/2016  PCP: Tula Nakayama  Admit date: 03/19/2016 Discharge date: 03/23/2016  Time spent: > 35 minutes  Recommendations for Outpatient Follow-up:  1. Please be sure to follow up with your gastroenterologist   Discharge Diagnoses:  Active Problems:   Hypertension   Hypercholesterolemia   DM2 (diabetes mellitus, type 2) (HCC)   Rheumatoid arthritis ([place])   Acute deep vein thrombosis (DVT) of left lower extremity (HCC)   A-fib (HCC)   Iron deficiency anemia due to chronic blood loss   Anemia of chronic renal failure, stage 3 (moderate)   Symptomatic anemia   Shortness of breath   Discharge Condition: stable  Diet recommendation: regular diet  Filed Weights   03/19/16 2229 03/21/16 1224 03/23/16 1116  Weight: 80.4 kg (177 lb 4 oz) 80.3 kg (177 lb) 80.3 kg (177 lb)    History of present illness:  70 year old female with history of DM type II, RA, tonic DVT in the left leg, iron deficiency, hypertension, hyperlipidemia, GERD, A. fib on xarelto, CK D who presented to the hospital secondary to shortness of breath.  Hospital Course:  Symptomatic anemia - Most likely secondary to gastric ulcer, there was question whether there was bleeding secondary to AVM and small bowel but this was not found on endoscopy - Resolved after transfusion. No active bleeding on endoscopy  Gastric ulcer - Continue PPI and recommend patient follow-up with gastroenterologist  Number known medical conditions listed above continue home medication regimen listed below  Procedures:  Endoscopy  Capsule endoscopy  Consultations:  Gastroenterology: Dr. Benson Norway  Discharge Exam: Vitals:   03/23/16 1330 03/23/16 1400  BP:  114/69  Pulse: (!) 120 82  Resp: 19 18  Temp:  97.7 F (36.5 C)    General: Patient in no acute distress, alert and awake Cardiovascular: Regular rate and rhythm, no murmurs  rubs Respiratory: No increased work of breathing, no wheezes  Discharge Instructions   Discharge Instructions    Call MD for:  difficulty breathing, headache or visual disturbances    Complete by:  As directed    Call MD for:  extreme fatigue    Complete by:  As directed    Call MD for:  temperature >100.4    Complete by:  As directed    Diet - low sodium heart healthy    Complete by:  As directed    Discharge instructions    Complete by:  As directed    Please follow up with your gastroenterologist in 1-2 weeks or sooner.   Increase activity slowly    Complete by:  As directed      Current Discharge Medication List    CONTINUE these medications which have NOT CHANGED   Details  buPROPion (WELLBUTRIN SR) 150 MG 12 hr tablet Take 150 mg by mouth 2 (two) times daily.      Cholecalciferol (VITAMIN D3) 1000 units CAPS Take 1,000 Units by mouth daily.    Cyanocobalamin (VITAMIN B 12 PO) Take 1,000 mcg by mouth daily.     diltiazem (CARDIZEM CD) 240 MG 24 hr capsule Take 1 capsule (240 mg total) by mouth daily. Qty: 90 capsule, Refills: 1    esomeprazole (NEXIUM) 40 MG capsule Take 40 mg by mouth daily.      ferrous sulfate 325 (65 FE) MG EC tablet Take 325 mg by mouth every other day.    furosemide (LASIX) 40 MG tablet TAKE 1 TABLET (40 MG  TOTAL) BY MOUTH DAILY. Qty: 30 tablet, Refills: 2    glimepiride (AMARYL) 2 MG tablet Take 2 mg by mouth every morning. Refills: 3    leflunomide (ARAVA) 20 MG tablet Take 20 mg by mouth daily.     metFORMIN (GLUCOPHAGE-XR) 500 MG 24 hr tablet Take 1,000 mg by mouth 2 (two) times daily. Refills: 1    metoprolol (LOPRESSOR) 50 MG tablet Take 1.5 tablets (75 mg total) by mouth 2 (two) times daily. Qty: 270 tablet, Refills: 1    Multiple Vitamins-Minerals (PRESERVISION AREDS) CAPS Take 1 capsule by mouth 2 (two) times daily.    Omega 3 1200 MG CAPS Take 1 capsule (1,200 mg total) by mouth daily. May resume 5 days post-op    ORENCIA  125 MG/ML SOSY Inject 125 mg into the skin every 7 (seven) days. Thurs.    potassium chloride (KLOR-CON 10) 10 MEQ tablet Take 2 tablets (20 mEq total) by mouth 2 (two) times daily. Qty: 120 tablet, Refills: 10    predniSONE (DELTASONE) 5 MG tablet Take 5 mg by mouth daily.    rivaroxaban (XARELTO) 20 MG TABS tablet Take 1 tablet (20 mg total) by mouth daily with supper. Resume 5 days post-op Qty: 30 tablet, Refills: 3    rosuvastatin (CRESTOR) 10 MG tablet Take 10 mg by mouth daily.     fluticasone (FLONASE) 50 MCG/ACT nasal spray Place 1-2 sprays into both nostrils daily as needed for allergies.    Associated Diagnoses: Anemia, chronic renal failure, stage 3 (moderate); Iron deficiency anemia due to chronic blood loss; Anemia of chronic renal failure, stage 3 (moderate); Anticoagulant causing adverse effect in therapeutic use, sequela    nitroGLYCERIN (NITROSTAT) 0.4 MG SL tablet Place 0.4 mg under the tongue every 5 (five) minutes as needed for chest pain (x 3 doses).       STOP taking these medications     naproxen sodium (ANAPROX) 220 MG tablet        No Known Allergies    The results of significant diagnostics from this hospitalization (including imaging, microbiology, ancillary and laboratory) are listed below for reference.    Significant Diagnostic Studies: Dg Chest 2 View  Result Date: 03/19/2016 CLINICAL DATA:  Shortness of Breath EXAM: CHEST  2 VIEW COMPARISON:  02/21/2015 FINDINGS: Heart is upper limits normal in size. Mild peribronchial thickening. No confluent opacities or effusions. No acute bony abnormality. IMPRESSION: Mild bronchitic changes. Electronically Signed   By: Rolm Baptise M.D.   On: 03/19/2016 15:46    Microbiology: No results found for this or any previous visit (from the past 240 hour(s)).   Labs: Basic Metabolic Panel:  Recent Labs Lab 03/19/16 1740 03/20/16 0518 03/21/16 0348  NA 140 142 143  K 4.0 3.7 3.3*  CL 106 108 108  CO2 21*  25 25  GLUCOSE 67 77 55*  BUN 16 16 10   CREATININE 0.80 0.67 0.56  CALCIUM 8.8* 9.1 8.8*   Liver Function Tests: No results for input(s): AST, ALT, ALKPHOS, BILITOT, PROT, ALBUMIN in the last 168 hours. No results for input(s): LIPASE, AMYLASE in the last 168 hours. No results for input(s): AMMONIA in the last 168 hours. CBC:  Recent Labs Lab 03/19/16 1740 03/20/16 0518 03/20/16 1306 03/21/16 0348 03/22/16 0448  WBC 11.2* 11.6* 11.9* 9.8 11.1*  NEUTROABS 8.9*  --   --   --   --   HGB 7.7* 7.0* 9.6* 9.6* 9.6*  HCT 26.8* 24.7* 32.4* 32.1* 32.8*  MCV  71.7* 72.4* 75.2* 75.2* 76.5*  PLT 345 310 305 278 296   Cardiac Enzymes: No results for input(s): CKTOTAL, CKMB, CKMBINDEX, TROPONINI in the last 168 hours. BNP: BNP (last 3 results)  Recent Labs  03/19/16 1740  BNP 227.9*    ProBNP (last 3 results) No results for input(s): PROBNP in the last 8760 hours.  CBG:  Recent Labs Lab 03/21/16 0752 03/21/16 1135 03/21/16 1705 03/22/16 1629 03/23/16 0733  GLUCAP 85 124* 216* 196* 128*   Signed:  Velvet Bathe MD.  [facility] 03/23/2016, 2:58 PM

## 2016-03-23 NOTE — Progress Notes (Signed)
Completed D/C teaching. Answered questions. Patient will be D/C home in stable condition.

## 2016-03-24 ENCOUNTER — Encounter (HOSPITAL_COMMUNITY): Payer: Self-pay | Admitting: Gastroenterology

## 2016-03-29 ENCOUNTER — Ambulatory Visit (INDEPENDENT_AMBULATORY_CARE_PROVIDER_SITE_OTHER): Payer: Medicare Other | Admitting: Cardiology

## 2016-03-29 ENCOUNTER — Ambulatory Visit: Payer: Medicare Other

## 2016-03-29 ENCOUNTER — Encounter: Payer: Self-pay | Admitting: Cardiology

## 2016-03-29 DIAGNOSIS — K922 Gastrointestinal hemorrhage, unspecified: Secondary | ICD-10-CM | POA: Diagnosis not present

## 2016-03-29 DIAGNOSIS — I482 Chronic atrial fibrillation, unspecified: Secondary | ICD-10-CM

## 2016-03-29 DIAGNOSIS — Z7901 Long term (current) use of anticoagulants: Secondary | ICD-10-CM | POA: Diagnosis not present

## 2016-03-29 DIAGNOSIS — D649 Anemia, unspecified: Secondary | ICD-10-CM

## 2016-03-29 DIAGNOSIS — Z8719 Personal history of other diseases of the digestive system: Secondary | ICD-10-CM | POA: Insufficient documentation

## 2016-03-29 NOTE — Assessment & Plan Note (Signed)
Rate controlled 

## 2016-03-29 NOTE — Assessment & Plan Note (Signed)
Suspected small bowel AVM

## 2016-03-29 NOTE — Progress Notes (Signed)
03/29/2016 [name]   [birth year]  [id number]  Primary Physician Tula Nakayama Primary Cardiologist: Dr Stanford Breed  HPI:  70 y/o female followed by Dr Stanford Breed with a history of atrial fibrillation. Echocardiogram July 2016 showed normal LV systolic function. Nuclear study December 2016 showed normal perfusion. Not gated because of atrial fibrillation. Patient had digoxin discontinued and metoprolol decreased previously because of bradycardia. She has a CHADs VASc of 4 for age, sex, HTN, DM.  She has been on Xarelto 20 mg. She was recently admitted to [facility] on 03/19/16 with weakness and SOB. Hgb noted to be 7. Xarelto was held and GI work up undertaken by Dr Benson Norway.  Capsul endoscopy suggested bleeding AVM though I don't think was specifically identified. She was transfused and discharge with instructions to hold Xarelto for 4 days then resume. She is in the office today for follow up. She says she feels the best she has in some time. No obvious bleeding noted or SOB like she had on admission 03/19/16.    Current Outpatient Prescriptions  Medication Sig Dispense Refill  . buPROPion (WELLBUTRIN SR) 150 MG 12 hr tablet Take 150 mg by mouth 2 (two) times daily.      . Cholecalciferol (VITAMIN D3) 1000 units CAPS Take 1,000 Units by mouth daily.    . Cyanocobalamin (VITAMIN B 12 PO) Take 1,000 mcg by mouth daily.     [name] diltiazem (CARDIZEM CD) 240 MG 24 hr capsule Take 1 capsule (240 mg total) by mouth daily. 90 capsule 1  . esomeprazole (NEXIUM) 40 MG capsule Take 40 mg by mouth daily.      . ferrous sulfate 325 (65 FE) MG EC tablet Take 325 mg by mouth every other day.    . fluticasone (FLONASE) 50 MCG/ACT nasal spray Place 1-2 sprays into both nostrils daily as needed for allergies.     . furosemide (LASIX) 40 MG tablet TAKE 1 TABLET (40 MG TOTAL) BY MOUTH DAILY. 30 tablet 2  . glimepiride (AMARYL) 2 MG tablet Take 2 mg by mouth every morning.  3  . leflunomide (ARAVA) 20 MG tablet Take  20 mg by mouth daily.     . metFORMIN (GLUCOPHAGE-XR) 500 MG 24 hr tablet Take 1,000 mg by mouth 2 (two) times daily.  1  . metoprolol (LOPRESSOR) 50 MG tablet Take 1.5 tablets (75 mg total) by mouth 2 (two) times daily. 270 tablet 1  . Multiple Vitamins-Minerals (PRESERVISION AREDS) CAPS Take 1 capsule by mouth 2 (two) times daily.    . nitroGLYCERIN (NITROSTAT) 0.4 MG SL tablet Place 0.4 mg under the tongue every 5 (five) minutes as needed for chest pain (x 3 doses).     . Omega 3 1200 MG CAPS Take 1 capsule by mouth daily.    [name] ORENCIA 125 MG/ML SOSY Inject 125 mg into the skin every 7 (seven) days. Thurs.    . potassium chloride (KLOR-CON 10) 10 MEQ tablet Take 2 tablets (20 mEq total) by mouth 2 (two) times daily. 120 tablet 10  . predniSONE (DELTASONE) 5 MG tablet Take 5 mg by mouth daily.    . rivaroxaban (XARELTO) 20 MG TABS tablet Take 20 mg by mouth daily with supper.    . rosuvastatin (CRESTOR) 10 MG tablet Take 10 mg by mouth daily.      No current facility-administered medications for this visit.     No Known Allergies  Social History   Social History  . Marital status: Divorced  Spouse name: N/A  . Number of children: N/A  . Years of education: N/A   Occupational History  . Retired    Social History Main Topics  . Smoking status: Never Smoker  . Smokeless tobacco: Never Used  . Alcohol use No  . Drug use: No  . Sexual activity: Not Currently   Other Topics Concern  . Not on file   Social History Narrative  . No narrative on file     Review of Systems: General: negative for chills, fever, night sweats or weight changes.  Cardiovascular: negative for chest pain, dyspnea on exertion, edema, orthopnea, palpitations, paroxysmal nocturnal dyspnea or shortness of breath Dermatological: negative for rash Respiratory: negative for cough or wheezing Urologic: negative for hematuria Abdominal: negative for nausea, vomiting, diarrhea, bright red blood per rectum,  melena, or hematemesis Neurologic: negative for visual changes, syncope, or dizziness All other systems reviewed and are otherwise negative except as noted above.    Blood pressure 124/68, pulse 93, height 5\' 7"  (1.702 m), weight 171 lb 9.6 oz (77.8 kg).  General appearance: alert, cooperative, appears older than stated age and no distress Neck: no carotid bruit and no JVD Lungs: clear to auscultation bilaterally Heart: irregularly irregular rhythm Extremities: no edema Skin: pale cool dry Neurologic: Grossly normal   ASSESSMENT AND PLAN:   Symptomatic anemia Admitted 03/19/16-03/23/16- transfused  GI bleeding Suspected small bowel AVM  Chronic atrial fibrillation (HCC) Rate controlled  Chronic anticoagulation CHADS VASc=5, pt on Xarelto   PLAN  She has follow labs scheduled with Dr Benson Norway. If she has recurrent symptomatic anemia we will need to consider stopping her anticoagulation.   Kerin Ransom PA-C 03/29/2016 4:58 PM

## 2016-03-29 NOTE — Assessment & Plan Note (Signed)
Admitted 03/19/16-03/23/16- transfused

## 2016-03-29 NOTE — Assessment & Plan Note (Signed)
CHADS VASc=5, pt on Xarelto

## 2016-03-29 NOTE — Patient Instructions (Addendum)
Your physician recommends that you schedule a follow-up appointment in: AS SCHEDULED  

## 2016-04-03 ENCOUNTER — Other Ambulatory Visit: Payer: Self-pay | Admitting: Cardiology

## 2016-04-05 DIAGNOSIS — Z4789 Encounter for other orthopedic aftercare: Secondary | ICD-10-CM | POA: Diagnosis not present

## 2016-04-05 DIAGNOSIS — Z9889 Other specified postprocedural states: Secondary | ICD-10-CM | POA: Diagnosis not present

## 2016-04-05 NOTE — Telephone Encounter (Signed)
Rx has been sent to the pharmacy electronically. ° °

## 2016-04-06 ENCOUNTER — Ambulatory Visit (HOSPITAL_COMMUNITY): Payer: Medicare Other | Attending: Cardiovascular Disease

## 2016-04-06 ENCOUNTER — Other Ambulatory Visit: Payer: Self-pay

## 2016-04-06 ENCOUNTER — Ambulatory Visit (INDEPENDENT_AMBULATORY_CARE_PROVIDER_SITE_OTHER): Payer: Medicare Other

## 2016-04-06 DIAGNOSIS — I482 Chronic atrial fibrillation: Secondary | ICD-10-CM | POA: Insufficient documentation

## 2016-04-06 DIAGNOSIS — I340 Nonrheumatic mitral (valve) insufficiency: Secondary | ICD-10-CM | POA: Diagnosis not present

## 2016-04-06 DIAGNOSIS — I4821 Permanent atrial fibrillation: Secondary | ICD-10-CM

## 2016-04-12 ENCOUNTER — Telehealth: Payer: Self-pay | Admitting: *Deleted

## 2016-04-12 DIAGNOSIS — E7800 Pure hypercholesterolemia, unspecified: Secondary | ICD-10-CM | POA: Diagnosis not present

## 2016-04-12 MED ORDER — DILTIAZEM HCL ER COATED BEADS 300 MG PO CP24: 300.0000 mg | ORAL_CAPSULE | Freq: Every day | ORAL | 3 refills | Status: DC

## 2016-04-12 NOTE — Telephone Encounter (Signed)
-----   Message from Lelon Perla, MD sent at 04/12/2016  7:53 AM EST ----- Change cardizem to 300 mg daily Kirk Ruths

## 2016-04-12 NOTE — Telephone Encounter (Signed)
Spoke with pt, Aware of dr crenshaw's recommendations. New script sent to the pharmacy  

## 2016-04-13 DIAGNOSIS — D500 Iron deficiency anemia secondary to blood loss (chronic): Secondary | ICD-10-CM | POA: Diagnosis not present

## 2016-04-19 DIAGNOSIS — Z79899 Other long term (current) drug therapy: Secondary | ICD-10-CM | POA: Diagnosis not present

## 2016-04-19 DIAGNOSIS — Z7952 Long term (current) use of systemic steroids: Secondary | ICD-10-CM | POA: Diagnosis not present

## 2016-04-19 DIAGNOSIS — M0589 Other rheumatoid arthritis with rheumatoid factor of multiple sites: Secondary | ICD-10-CM | POA: Diagnosis not present

## 2016-04-19 DIAGNOSIS — D500 Iron deficiency anemia secondary to blood loss (chronic): Secondary | ICD-10-CM | POA: Diagnosis not present

## 2016-04-19 DIAGNOSIS — M5030 Other cervical disc degeneration, unspecified cervical region: Secondary | ICD-10-CM | POA: Diagnosis not present

## 2016-04-19 DIAGNOSIS — M2550 Pain in unspecified joint: Secondary | ICD-10-CM | POA: Diagnosis not present

## 2016-04-19 DIAGNOSIS — M150 Primary generalized (osteo)arthritis: Secondary | ICD-10-CM | POA: Diagnosis not present

## 2016-04-19 DIAGNOSIS — E663 Overweight: Secondary | ICD-10-CM | POA: Diagnosis not present

## 2016-04-19 DIAGNOSIS — M25512 Pain in left shoulder: Secondary | ICD-10-CM | POA: Diagnosis not present

## 2016-04-19 DIAGNOSIS — Z6828 Body mass index (BMI) 28.0-28.9, adult: Secondary | ICD-10-CM | POA: Diagnosis not present

## 2016-04-21 ENCOUNTER — Ambulatory Visit: Payer: Medicare Other

## 2016-05-03 ENCOUNTER — Telehealth: Payer: Self-pay | Admitting: *Deleted

## 2016-05-03 NOTE — Telephone Encounter (Signed)
Spoke with pt, her diltiazem was increased to 300 mg about 20 days ago due to elevated rate on holter monitor. She has called with c/o SOB with little exertion, swelling in feet and ankles by the end of the day and constipation. She denies chest pain or orthopnea. She reports her weight is up 10 lbs from when she left the hospital on 03-23-16. She does notice her pulse running high after exertion like making the bed. She has not taken her diltiazem this morning and currently her bp is 117/82 and pulse 114 by her bp cuff. She is taking furosemide 10 mg 2 tablets once daily. Instructed patient to go ahead and take the diltiazem and to take extra 20 mg of furosemide now. Patient voiced understanding . Will forward for dr Stanford Breed review

## 2016-05-03 NOTE — Telephone Encounter (Signed)
pt called stating she is having side affects from tthe Diltiazem 300 mg, she wouldl ike to talk with you please, thanks

## 2016-05-03 NOTE — Telephone Encounter (Signed)
Spoke with pt, Aware of dr Jacalyn Lefevre recommendations. Follow up scheduled with chris berge np.

## 2016-05-03 NOTE — Telephone Encounter (Signed)
[name]  

## 2016-05-04 ENCOUNTER — Ambulatory Visit: Payer: Medicare Other | Admitting: Nurse Practitioner

## 2016-05-09 ENCOUNTER — Telehealth: Payer: Self-pay | Admitting: Cardiology

## 2016-05-09 MED ORDER — METOPROLOL TARTRATE 50 MG PO TABS: 100.0000 mg | ORAL_TABLET | Freq: Two times a day (BID) | ORAL | 1 refills | Status: DC

## 2016-05-09 NOTE — Telephone Encounter (Signed)
141  Pt c/o BP issue: STAT if pt c/o blurred vision, one-sided weakness or slurred speech  1. What are your last 5 BP readings? 124/87  141  2. Are you having any other symptoms (ex. Dizziness, headache, blurred vision, passed out)? no  3. What is your BP issue? High, with palpitations

## 2016-05-09 NOTE — Telephone Encounter (Signed)
Made patient aware of MD recommendations.  Advised to continue to monitor BP and HR.   F/u appt made for 2/7 at [place] with Kerin Ransom PA at [facility] location.  Pt agreed and verbalized understanding.

## 2016-05-09 NOTE — Telephone Encounter (Signed)
Returned call to patient-patient reports increase in HR (Hx: permanent Afib).  Reports she drank cold water this morning and her heart rate increased to 140s BP this AM 124/87.  Reports increased SOB with exertion (SOB noted on the phone as patient had just went to the restroom).  Patient took BP and HR on the phone 141/104 and HR 140.  Denies palpitations, dizziness, CP, only reports SOB.  Reports HR usually runs 118-120 during the day?   Reports weight is increased as well-175lbs this AM (reports usually 168-170) and reports left leg swelling.  Reports taking cardizem (300mg ) and metoprolol (75mg ) this AM.  Advised medication changes may need to be made or get her in to see a APP/Afib clinic.    Will route to DOD (Dr. Stanford Breed) for recommendations.

## 2016-05-09 NOTE — Telephone Encounter (Signed)
Change lopressor to 100 BID; fu paov Kirk Ruths

## 2016-05-11 ENCOUNTER — Encounter: Payer: Self-pay | Admitting: Cardiology

## 2016-05-11 ENCOUNTER — Ambulatory Visit (INDEPENDENT_AMBULATORY_CARE_PROVIDER_SITE_OTHER): Payer: Medicare Other | Admitting: Cardiology

## 2016-05-11 VITALS — BP 118/68 | HR 118 | Ht 67.0 in | Wt 180.8 lb

## 2016-05-11 DIAGNOSIS — I10 Essential (primary) hypertension: Secondary | ICD-10-CM

## 2016-05-11 DIAGNOSIS — Z79899 Other long term (current) drug therapy: Secondary | ICD-10-CM | POA: Diagnosis not present

## 2016-05-11 DIAGNOSIS — Z7901 Long term (current) use of anticoagulants: Secondary | ICD-10-CM

## 2016-05-11 DIAGNOSIS — I4821 Permanent atrial fibrillation: Secondary | ICD-10-CM

## 2016-05-11 DIAGNOSIS — M05752 Rheumatoid arthritis with rheumatoid factor of left hip without organ or systems involvement: Secondary | ICD-10-CM | POA: Diagnosis not present

## 2016-05-11 DIAGNOSIS — I482 Chronic atrial fibrillation: Secondary | ICD-10-CM

## 2016-05-11 DIAGNOSIS — R0602 Shortness of breath: Secondary | ICD-10-CM | POA: Diagnosis not present

## 2016-05-11 DIAGNOSIS — Z8719 Personal history of other diseases of the digestive system: Secondary | ICD-10-CM | POA: Diagnosis not present

## 2016-05-11 LAB — CBC
HCT: 27.2 % — ABNORMAL LOW (ref 35.0–45.0)
Hemoglobin: 7.8 g/dL — ABNORMAL LOW (ref 11.7–15.5)
MCH: 20.5 pg — ABNORMAL LOW (ref 27.0–33.0)
MCHC: 28.7 g/dL — ABNORMAL LOW (ref 32.0–36.0)
MCV: 71.4 fL — ABNORMAL LOW (ref 80.0–100.0)
MPV: 9.9 fL (ref 7.5–12.5)
Platelets: 336 10*3/uL (ref 140–400)
RBC: 3.81 MIL/uL (ref 3.80–5.10)
RDW: 20.6 % — ABNORMAL HIGH (ref 11.0–15.0)
WBC: 11.2 10*3/uL — ABNORMAL HIGH (ref 3.8–10.8)

## 2016-05-11 LAB — BASIC METABOLIC PANEL
BUN: 21 mg/dL (ref 7–25)
CO2: 25 mmol/L (ref 20–31)
Calcium: 9.6 mg/dL (ref 8.6–10.4)
Chloride: 104 mmol/L (ref 98–110)
Creat: 0.75 mg/dL (ref 0.60–0.93)
Glucose, Bld: 145 mg/dL — ABNORMAL HIGH (ref 65–99)
Potassium: 4.4 mmol/L (ref 3.5–5.3)
Sodium: 140 mmol/L (ref 135–146)

## 2016-05-11 MED ORDER — DILTIAZEM HCL ER COATED BEADS 360 MG PO CP24: 360.0000 mg | ORAL_CAPSULE | Freq: Every day | ORAL | 3 refills | Status: DC

## 2016-05-11 MED ORDER — METOPROLOL TARTRATE 50 MG PO TABS: 100.0000 mg | ORAL_TABLET | Freq: Two times a day (BID) | ORAL | 6 refills | Status: DC

## 2016-05-11 NOTE — Assessment & Plan Note (Signed)
CHADS VASc=5, pt on Xarelto

## 2016-05-11 NOTE — Assessment & Plan Note (Signed)
Chronic Lt hip pain and LLE edema

## 2016-05-11 NOTE — Progress Notes (Signed)
05/11/2016 [name]   July 01, 1945  [id number]  Primary Physician Tula Nakayama Primary Cardiologist: Dr Stanford Breed  HPI:  71 y/o female followed by Dr Stanford Breed with a history of permanent atrial fibrillation with variable VR. In the past she has had her rate control medications cut back. Recently she has had episodic dyspnea that lasts "a few seconds".  A 24 hr Holter showed AF with increased VR and her Lopressor was increased to 100 mg BID- she is also on Diltiazem 300 mg daily. Echo done 04/06/16 showed normal LVF-EF 55-60%. She has had a prior Myoview Dec 2016 that was low risk. She denies any chest pain during these episodes. She denies any diaphoresis or near syncope but says she has to "sit down" when they come on her. In the office today her HR is 118.    Current Outpatient Prescriptions  Medication Sig Dispense Refill  . buPROPion (WELLBUTRIN SR) 150 MG 12 hr tablet Take 150 mg by mouth 2 (two) times daily.      . Cholecalciferol (VITAMIN D3) 1000 units CAPS Take 1,000 Units by mouth daily.    . Cyanocobalamin (VITAMIN B 12 PO) Take 1,000 mcg by mouth daily.     [name] diltiazem (CARDIZEM CD) 360 MG 24 hr capsule Take 1 capsule (360 mg total) by mouth daily. 90 capsule 3  . esomeprazole (NEXIUM) 40 MG capsule Take 40 mg by mouth daily.      . fluticasone (FLONASE) 50 MCG/ACT nasal spray Place 1-2 sprays into both nostrils daily as needed for allergies.     . furosemide (LASIX) 20 MG tablet TAKE 2 TABLETS DAILY 60 tablet 6  . glimepiride (AMARYL) 2 MG tablet Take 2 mg by mouth every morning.  3  . leflunomide (ARAVA) 20 MG tablet Take 20 mg by mouth daily.     . metFORMIN (GLUCOPHAGE-XR) 500 MG 24 hr tablet Take 1,000 mg by mouth 2 (two) times daily.  1  . metoprolol (LOPRESSOR) 50 MG tablet Take 2 tablets (100 mg total) by mouth 2 (two) times daily. 60 tablet 6  . Multiple Vitamins-Minerals (PRESERVISION AREDS) CAPS Take 1 capsule by mouth 2 (two) times daily.    . nitroGLYCERIN  (NITROSTAT) 0.4 MG SL tablet Place 0.4 mg under the tongue every 5 (five) minutes as needed for chest pain (x 3 doses).     . Omega 3 1200 MG CAPS Take 1 capsule by mouth daily.    [name] ORENCIA 125 MG/ML SOSY Inject 125 mg into the skin every 7 (seven) days. Thurs.    . potassium chloride (KLOR-CON 10) 10 MEQ tablet Take 2 tablets (20 mEq total) by mouth 2 (two) times daily. 120 tablet 10  . predniSONE (DELTASONE) 5 MG tablet Take 5 mg by mouth daily.    . rivaroxaban (XARELTO) 20 MG TABS tablet Take 20 mg by mouth daily with supper.    . rosuvastatin (CRESTOR) 10 MG tablet Take 10 mg by mouth daily.      No current facility-administered medications for this visit.     No Known Allergies  Social History   Social History  . Marital status: Divorced    Spouse name: N/A  . Number of children: N/A  . Years of education: N/A   Occupational History  . Retired    Social History Main Topics  . Smoking status: Never Smoker  . Smokeless tobacco: Never Used  . Alcohol use No  . Drug use: No  . Sexual activity:  Not Currently   Other Topics Concern  . Not on file   Social History Narrative  . No narrative on file     Review of Systems: General: negative for chills, fever, night sweats or weight changes.  Cardiovascular: negative for chest pain, orthopnea, palpitations, paroxysmal nocturnal dyspnea Dermatological: negative for rash Respiratory: negative for cough or wheezing Urologic: negative for hematuria Abdominal: negative for nausea, vomiting, diarrhea, bright red blood per rectum, melena, or hematemesis Neurologic: negative for visual changes, syncope, or dizziness All other systems reviewed and are otherwise negative except as noted above.    Blood pressure 118/68, pulse (!) 118, height 5\' 7"  (1.702 m), weight 180 lb 12.8 oz (82 kg), SpO2 95 %.  General appearance: alert, cooperative and no distress Neck: no carotid bruit and no JVD Lungs: clear to auscultation  bilaterally Heart: irregularly irregular rhythm Extremities: Lt LE edema (chronic per pt) Skin: Skin color, texture, turgor normal. No rashes or lesions Neurologic: Grossly normal  EKG AF with VR 118  ASSESSMENT AND PLAN:   Shortness of breath Pt complaining of episodic SOB and DOE. Holter showed increased AF rate and beta blocker increased last week.  Chronic atrial fibrillation (HCC) Increased VR- HR 118 today. In the past she has had slow rates and her medication had to be cut back.  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 R/O symptomatic anemia  Rheumatoid arthritis (HCC) Chronic Lt hip pain and LLE edema  Hypertension Controlled- echo Jan 2018- EF 55-60%  Chronic anticoagulation CHADS VASc=5, pt on Xarelto   PLAN  Discussed with Dr Stanford Breed in the office today. Plan to check labs- CBC, BNP, BMP. Increase Diltiazem to 360 mg daily. F/U in two weeks.   Kerin Ransom PA-C 05/11/2016 8:40 AM

## 2016-05-11 NOTE — Assessment & Plan Note (Signed)
Variable VR- slow in the past, now fast

## 2016-05-11 NOTE — Assessment & Plan Note (Signed)
Controlled- echo Jan 2018- EF 55-60%

## 2016-05-11 NOTE — Assessment & Plan Note (Signed)
Increased VR- HR 118 today. In the past she has had slow rates and her medication had to be cut back.

## 2016-05-11 NOTE — Assessment & Plan Note (Signed)
Pt complaining of episodic SOB and DOE. Holter showed increased AF rate and beta blocker increased last week.

## 2016-05-11 NOTE — Assessment & Plan Note (Signed)
Suspected small bowel AVM Dec 2017- transfused. R/O symptomatic anemia

## 2016-05-11 NOTE — Patient Instructions (Signed)
Medication Instructions:  INCREASE diltiazem (Cardizem) to 360mg  (1 capsule) once daily.   Labwork: Have lab work completed today (CBC, BMET, BNP)  Testing/Procedures: NONE  Follow-Up: Wednesday, February 21 at 10:30 AM with Dr. Stanford Breed at [facility].  Any Other Special Instructions Will Be Listed Below (If Applicable).     If you need a refill on your cardiac medications before your next appointment, please call your pharmacy.

## 2016-05-12 LAB — BRAIN NATRIURETIC PEPTIDE: Brain Natriuretic Peptide: 250.3 pg/mL — ABNORMAL HIGH (ref <100)

## 2016-05-13 ENCOUNTER — Other Ambulatory Visit: Payer: Self-pay | Admitting: *Deleted

## 2016-05-13 ENCOUNTER — Telehealth: Payer: Self-pay | Admitting: *Deleted

## 2016-05-13 DIAGNOSIS — R710 Precipitous drop in hematocrit: Secondary | ICD-10-CM

## 2016-05-13 NOTE — Telephone Encounter (Signed)
Agree with plan [name]  

## 2016-05-13 NOTE — Telephone Encounter (Signed)
Patient called back to me - states she was out shopping earlier and could not write down instructions. Voiced understanding of stopping xarelto until further notice, will come to [facility] for Monday labs.  Aware that Dr. Stanford Breed will give guidance on this and we'll call w further recommendations.

## 2016-05-13 NOTE — Telephone Encounter (Signed)
-----   Message from Erlene Quan, [place] sent at 05/12/2016  4:12 PM EST ----- Please let pt know she is anemic enough to be symptomatic. She should stop her Xarelto. Repeat CBC Monday.  I'll review further with Dr Stanford Breed.  Kerin Ransom PA-C 05/12/2016 4:11 PM

## 2016-05-13 NOTE — Telephone Encounter (Signed)
Instructions relayed to patient. Informed her we'll follow up w further direction from MD. She'll stop xarelto today and get repeat CBC Monday.

## 2016-05-13 NOTE — Telephone Encounter (Signed)
Follow up  ° °Patient returning call back to nurse from today .  °

## 2016-05-13 NOTE — Telephone Encounter (Signed)
Spoke with pt, Aware of dr crenshaw's recommendations.  °

## 2016-05-13 NOTE — Progress Notes (Deleted)
HPI: FU Atrial fibrillation. Nuclear study December 2016 showed normal perfusion. Not gated because of atrial fibrillation. Patient had digoxin discontinued and metoprolol decreased previously because of bradycardia. Patient seen recently for increased dyspnea. Holter monitor January 2018 showed A. fib with elevated rate and cardizem increased. Echocardiogram January 2018 showed normal LV function, mild biatrial enlargement and mild mitral regurgitation. Patient's diuretics were transiently increased. Follow-up laboratories showed BNP 250 and hemoglobin 7.8. Anemia felt to be contributing to symptoms and Xarelto DCed. Note patient had been admitted December 2017 with anemia and found to have an actively bleeding jejunal AVM. Her symptoms improved with transfusion. Since she was last seen   Current Outpatient Prescriptions  Medication Sig Dispense Refill  . buPROPion (WELLBUTRIN SR) 150 MG 12 hr tablet Take 150 mg by mouth 2 (two) times daily.      . Cholecalciferol (VITAMIN D3) 1000 units CAPS Take 1,000 Units by mouth daily.    . Cyanocobalamin (VITAMIN B 12 PO) Take 1,000 mcg by mouth daily.     [name] diltiazem (CARDIZEM CD) 360 MG 24 hr capsule Take 1 capsule (360 mg total) by mouth daily. 90 capsule 3  . esomeprazole (NEXIUM) 40 MG capsule Take 40 mg by mouth daily.      . fluticasone (FLONASE) 50 MCG/ACT nasal spray Place 1-2 sprays into both nostrils daily as needed for allergies.     . furosemide (LASIX) 20 MG tablet TAKE 2 TABLETS DAILY 60 tablet 6  . glimepiride (AMARYL) 2 MG tablet Take 2 mg by mouth every morning.  3  . leflunomide (ARAVA) 20 MG tablet Take 20 mg by mouth daily.     . metFORMIN (GLUCOPHAGE-XR) 500 MG 24 hr tablet Take 1,000 mg by mouth 2 (two) times daily.  1  . metoprolol (LOPRESSOR) 50 MG tablet Take 2 tablets (100 mg total) by mouth 2 (two) times daily. 60 tablet 6  . Multiple Vitamins-Minerals (PRESERVISION AREDS) CAPS Take 1 capsule by mouth 2 (two) times daily.     . nitroGLYCERIN (NITROSTAT) 0.4 MG SL tablet Place 0.4 mg under the tongue every 5 (five) minutes as needed for chest pain (x 3 doses).     . Omega 3 1200 MG CAPS Take 1 capsule by mouth daily.    [name] ORENCIA 125 MG/ML SOSY Inject 125 mg into the skin every 7 (seven) days. Thurs.    . potassium chloride (KLOR-CON 10) 10 MEQ tablet Take 2 tablets (20 mEq total) by mouth 2 (two) times daily. 120 tablet 10  . predniSONE (DELTASONE) 5 MG tablet Take 5 mg by mouth daily.    . rivaroxaban (XARELTO) 20 MG TABS tablet Take 20 mg by mouth daily with supper.    . rosuvastatin (CRESTOR) 10 MG tablet Take 10 mg by mouth daily.      No current facility-administered medications for this visit.      Past Medical History:  Diagnosis Date  . Actinic keratosis   . Anemia    hx of  . Anemia of chronic renal failure, stage 3 (moderate) 07/17/2015  . Anticoagulant causing adverse effect in therapeutic use 07/17/2015  . Anxiety   . Atrial fibrillation ([place])   . Atrial fibrillation ([place])   . Carpal tunnel syndrome   . Chest pain   . Depression   . Edema   . Gastroesophageal reflux disease   . Hepatitis 1970  . Hiatal hernia   . Hyperlipidemia   . Hypertension   . Iron  deficiency anemia due to chronic blood loss 07/17/2015  . Jaundice    age 57  . Memory loss   . Peripheral vascular disease (HCC)    DVT left lower leg  . Rheumatoid arthritis(714.0)   . Thyroid disease    multi nodular goiter  . Type II diabetes mellitus ([place])     Past Surgical History:  Procedure Laterality Date  . CATARACT EXTRACTION Left   . COLONOSCOPY WITH PROPOFOL N/A 02/20/2015   Procedure: COLONOSCOPY WITH PROPOFOL;  Surgeon: Carol Ada, MD;  Location: [facility];  Service: Endoscopy;  Laterality: N/A;  . ENTEROSCOPY N/A 03/23/2016   Procedure: ENTEROSCOPY;  Surgeon: Carol Ada, MD;  Location: [facility];  Service: Endoscopy;  Laterality: N/A;  . ESOPHAGOGASTRODUODENOSCOPY (EGD) WITH PROPOFOL N/A 02/20/2015    Procedure: ESOPHAGOGASTRODUODENOSCOPY (EGD) WITH PROPOFOL;  Surgeon: Carol Ada, MD;  Location: [facility];  Service: Endoscopy;  Laterality: N/A;  . GIVENS CAPSULE STUDY N/A 03/21/2016   Procedure: GIVENS CAPSULE STUDY;  Surgeon: Carol Ada, MD;  Location: [facility];  Service: Endoscopy;  Laterality: N/A;  . HAND SURGERY  1995 and 1996   artificial joints both hands  . JOINT REPLACEMENT    . LUMBAR LAMINECTOMY/DECOMPRESSION MICRODISCECTOMY N/A 02/10/2016   Procedure: MICROLUMBAR DECOMPRESSION L4-L5 AND L3- L4, AND EXCISION OF SYNOVIAL CYST L4-L5;  Surgeon: Susa Day, MD;  Location: [facility];  Service: Orthopedics;  Laterality: N/A;  . TOTAL HIP ARTHROPLASTY Left 2009  . TOTAL HIP ARTHROPLASTY Right 04/03/2013   Procedure: RIGHT TOTAL HIP ARTHROPLASTY ANTERIOR APPROACH;  Surgeon: Gearlean Alf, MD;  Location: [facility];  Service: Orthopedics;  Laterality: Right;    Social History   Social History  . Marital status: Divorced    Spouse name: N/A  . Number of children: N/A  . Years of education: N/A   Occupational History  . Retired    Social History Main Topics  . Smoking status: Never Smoker  . Smokeless tobacco: Never Used  . Alcohol use No  . Drug use: No  . Sexual activity: Not Currently   Other Topics Concern  . Not on file   Social History Narrative  . No narrative on file    Family History  Problem Relation Age of Onset  . Stroke Father   . Heart failure Sister   . Hypertension Sister   . Heart attack Neg Hx     ROS: no fevers or chills, productive cough, hemoptysis, dysphasia, odynophagia, melena, hematochezia, dysuria, hematuria, rash, seizure activity, orthopnea, PND, pedal edema, claudication. Remaining systems are negative.  Physical Exam: Well-developed well-nourished in no acute distress.  Skin is warm and dry.  HEENT is normal.  Neck is supple.  Chest is clear to auscultation with normal expansion.  Cardiovascular exam is regular rate and  rhythm.  Abdominal exam nontender or distended. No masses palpated. Extremities show no edema. neuro grossly intact  ECG

## 2016-05-16 ENCOUNTER — Inpatient Hospital Stay (HOSPITAL_COMMUNITY)
Admission: EM | Admit: 2016-05-16 | Discharge: 2016-05-17 | DRG: 812 | Disposition: A | Discharge status: Home / Self Care | Payer: Medicare Other | Attending: Internal Medicine | Admitting: Internal Medicine

## 2016-05-16 ENCOUNTER — Emergency Department (HOSPITAL_COMMUNITY): Payer: Medicare Other

## 2016-05-16 ENCOUNTER — Encounter (HOSPITAL_COMMUNITY): Payer: Self-pay

## 2016-05-16 ENCOUNTER — Telehealth: Payer: Self-pay | Admitting: Cardiology

## 2016-05-16 DIAGNOSIS — R0602 Shortness of breath: Secondary | ICD-10-CM | POA: Diagnosis not present

## 2016-05-16 DIAGNOSIS — Z79899 Other long term (current) drug therapy: Secondary | ICD-10-CM | POA: Diagnosis not present

## 2016-05-16 DIAGNOSIS — M05752 Rheumatoid arthritis with rheumatoid factor of left hip without organ or systems involvement: Secondary | ICD-10-CM

## 2016-05-16 DIAGNOSIS — E11319 Type 2 diabetes mellitus with unspecified diabetic retinopathy without macular edema: Secondary | ICD-10-CM | POA: Diagnosis not present

## 2016-05-16 DIAGNOSIS — R05 Cough: Secondary | ICD-10-CM | POA: Diagnosis not present

## 2016-05-16 DIAGNOSIS — R0609 Other forms of dyspnea: Secondary | ICD-10-CM

## 2016-05-16 DIAGNOSIS — Z86718 Personal history of other venous thrombosis and embolism: Secondary | ICD-10-CM

## 2016-05-16 DIAGNOSIS — D649 Anemia, unspecified: Secondary | ICD-10-CM | POA: Diagnosis present

## 2016-05-16 DIAGNOSIS — F32A Depression, unspecified: Secondary | ICD-10-CM | POA: Diagnosis present

## 2016-05-16 DIAGNOSIS — F418 Other specified anxiety disorders: Secondary | ICD-10-CM | POA: Diagnosis not present

## 2016-05-16 DIAGNOSIS — I129 Hypertensive chronic kidney disease with stage 1 through stage 4 chronic kidney disease, or unspecified chronic kidney disease: Secondary | ICD-10-CM | POA: Diagnosis present

## 2016-05-16 DIAGNOSIS — F419 Anxiety disorder, unspecified: Secondary | ICD-10-CM | POA: Diagnosis present

## 2016-05-16 DIAGNOSIS — E1151 Type 2 diabetes mellitus with diabetic peripheral angiopathy without gangrene: Secondary | ICD-10-CM | POA: Diagnosis present

## 2016-05-16 DIAGNOSIS — M069 Rheumatoid arthritis, unspecified: Secondary | ICD-10-CM | POA: Diagnosis present

## 2016-05-16 DIAGNOSIS — Z7952 Long term (current) use of systemic steroids: Secondary | ICD-10-CM | POA: Diagnosis not present

## 2016-05-16 DIAGNOSIS — R710 Precipitous drop in hematocrit: Secondary | ICD-10-CM | POA: Diagnosis not present

## 2016-05-16 DIAGNOSIS — Z96643 Presence of artificial hip joint, bilateral: Secondary | ICD-10-CM | POA: Diagnosis present

## 2016-05-16 DIAGNOSIS — Z7984 Long term (current) use of oral hypoglycemic drugs: Secondary | ICD-10-CM

## 2016-05-16 DIAGNOSIS — Z96698 Presence of other orthopedic joint implants: Secondary | ICD-10-CM | POA: Diagnosis present

## 2016-05-16 DIAGNOSIS — D509 Iron deficiency anemia, unspecified: Secondary | ICD-10-CM | POA: Diagnosis present

## 2016-05-16 DIAGNOSIS — I4821 Permanent atrial fibrillation: Secondary | ICD-10-CM | POA: Diagnosis present

## 2016-05-16 DIAGNOSIS — E1169 Type 2 diabetes mellitus with other specified complication: Secondary | ICD-10-CM

## 2016-05-16 DIAGNOSIS — I482 Chronic atrial fibrillation: Secondary | ICD-10-CM | POA: Diagnosis present

## 2016-05-16 DIAGNOSIS — E785 Hyperlipidemia, unspecified: Secondary | ICD-10-CM | POA: Diagnosis present

## 2016-05-16 DIAGNOSIS — F329 Major depressive disorder, single episode, unspecified: Secondary | ICD-10-CM | POA: Diagnosis present

## 2016-05-16 DIAGNOSIS — E119 Type 2 diabetes mellitus without complications: Secondary | ICD-10-CM

## 2016-05-16 DIAGNOSIS — N183 Chronic kidney disease, stage 3 (moderate): Secondary | ICD-10-CM | POA: Diagnosis present

## 2016-05-16 DIAGNOSIS — E1122 Type 2 diabetes mellitus with diabetic chronic kidney disease: Secondary | ICD-10-CM | POA: Diagnosis present

## 2016-05-16 DIAGNOSIS — Z7901 Long term (current) use of anticoagulants: Secondary | ICD-10-CM | POA: Diagnosis not present

## 2016-05-16 DIAGNOSIS — I10 Essential (primary) hypertension: Secondary | ICD-10-CM | POA: Diagnosis present

## 2016-05-16 DIAGNOSIS — K219 Gastro-esophageal reflux disease without esophagitis: Secondary | ICD-10-CM | POA: Diagnosis present

## 2016-05-16 LAB — BASIC METABOLIC PANEL
ANION GAP: 11 (ref 5–15)
BUN: 14 mg/dL (ref 6–20)
CO2: 24 mmol/L (ref 22–32)
Calcium: 8.9 mg/dL (ref 8.9–10.3)
Chloride: 106 mmol/L (ref 101–111)
Creatinine, Ser: 0.67 mg/dL (ref 0.44–1.00)
GFR calc Af Amer: >60 mL/min (ref >60)
GLUCOSE: 104 mg/dL — AB (ref 65–99)
POTASSIUM: 4.2 mmol/L (ref 3.5–5.1)
Sodium: 141 mmol/L (ref 135–145)

## 2016-05-16 LAB — CBC
HCT: 26.6 % — ABNORMAL LOW (ref 36.0–46.0)
Hemoglobin: 7.4 g/dL — ABNORMAL LOW (ref 12.0–15.0)
MCH: 19.4 pg — AB (ref 26.0–34.0)
MCHC: 27.8 g/dL — AB (ref 30.0–36.0)
MCV: 69.6 fL — ABNORMAL LOW (ref 78.0–100.0)
Platelets: 316 10*3/uL (ref 150–400)
RBC: 3.82 MIL/uL — ABNORMAL LOW (ref 3.87–5.11)
RDW: 20.5 % — AB (ref 11.5–15.5)
WBC: 9 10*3/uL (ref 4.0–10.5)

## 2016-05-16 LAB — D-DIMER, QUANTITATIVE: D-Dimer, Quant: 0.61 ug/mL-FEU — ABNORMAL HIGH (ref 0.00–0.50)

## 2016-05-16 LAB — PREPARE RBC (CROSSMATCH)

## 2016-05-16 LAB — TSH: TSH: 1.33 u[IU]/mL (ref 0.350–4.500)

## 2016-05-16 MED ORDER — FLUTICASONE PROPIONATE 50 MCG/ACT NA SUSP
1.0000 | Freq: Every day | NASAL | Status: DC | PRN
  Filled 2016-05-16: qty 16

## 2016-05-16 MED ORDER — SODIUM CHLORIDE 0.9% FLUSH: 3.0000 mL | INTRAVENOUS | Status: DC | PRN

## 2016-05-16 MED ORDER — DILTIAZEM HCL ER COATED BEADS 180 MG PO CP24
360.0000 mg | ORAL_CAPSULE | Freq: Every day | ORAL | Status: DC
  Administered 2016-05-17: 360 mg via ORAL
  Filled 2016-05-16: qty 2

## 2016-05-16 MED ORDER — PREDNISONE 5 MG PO TABS
5.0000 mg | ORAL_TABLET | Freq: Every day | ORAL | Status: DC
  Administered 2016-05-17: 5 mg via ORAL
  Filled 2016-05-16: qty 1

## 2016-05-16 MED ORDER — SODIUM CHLORIDE 0.9% FLUSH: 3.0000 mL | Freq: Two times a day (BID) | INTRAVENOUS | Status: DC

## 2016-05-16 MED ORDER — ACETAMINOPHEN 325 MG PO TABS: 650.0000 mg | ORAL_TABLET | Freq: Four times a day (QID) | ORAL | Status: DC | PRN

## 2016-05-16 MED ORDER — SODIUM CHLORIDE 0.9% FLUSH
3.0000 mL | Freq: Two times a day (BID) | INTRAVENOUS | Status: DC
  Administered 2016-05-17: 3 mL via INTRAVENOUS

## 2016-05-16 MED ORDER — ROSUVASTATIN CALCIUM 10 MG PO TABS
10.0000 mg | ORAL_TABLET | Freq: Every day | ORAL | Status: DC
  Administered 2016-05-17: 10 mg via ORAL
  Filled 2016-05-16: qty 1

## 2016-05-16 MED ORDER — PRESERVISION AREDS PO CAPS: 1.0000 | ORAL_CAPSULE | Freq: Two times a day (BID) | ORAL | Status: DC

## 2016-05-16 MED ORDER — OMEGA 3 1200 MG PO CAPS
1.0000 | ORAL_CAPSULE | Freq: Every day | ORAL | Status: DC
  Filled 2016-05-16: qty 1

## 2016-05-16 MED ORDER — PANTOPRAZOLE SODIUM 40 MG IV SOLR
40.0000 mg | Freq: Two times a day (BID) | INTRAVENOUS | Status: DC
  Administered 2016-05-17: 40 mg via INTRAVENOUS
  Filled 2016-05-16: qty 40

## 2016-05-16 MED ORDER — VITAMIN D 1000 UNITS PO TABS
1000.0000 [IU] | ORAL_TABLET | Freq: Every day | ORAL | Status: DC
  Administered 2016-05-17: 1000 [IU] via ORAL
  Filled 2016-05-16: qty 1

## 2016-05-16 MED ORDER — ONDANSETRON HCL 4 MG/2ML IJ SOLN: 4.0000 mg | Freq: Four times a day (QID) | INTRAMUSCULAR | Status: DC | PRN

## 2016-05-16 MED ORDER — INSULIN ASPART 100 UNIT/ML ~~LOC~~ SOLN: 0.0000 [IU] | SUBCUTANEOUS | Status: DC

## 2016-05-16 MED ORDER — METOPROLOL TARTRATE 50 MG PO TABS
100.0000 mg | ORAL_TABLET | Freq: Two times a day (BID) | ORAL | Status: DC
  Administered 2016-05-17: 100 mg via ORAL
  Filled 2016-05-16: qty 2

## 2016-05-16 MED ORDER — BUPROPION HCL ER (SR) 150 MG PO TB12
150.0000 mg | ORAL_TABLET | Freq: Two times a day (BID) | ORAL | Status: DC
  Administered 2016-05-17: 150 mg via ORAL
  Filled 2016-05-16: qty 1

## 2016-05-16 MED ORDER — SODIUM CHLORIDE 0.9 % IV SOLN: 250.0000 mL | INTRAVENOUS | Status: DC | PRN

## 2016-05-16 MED ORDER — HYDROCODONE-ACETAMINOPHEN 5-325 MG PO TABS: 1.0000 | ORAL_TABLET | ORAL | Status: DC | PRN

## 2016-05-16 MED ORDER — PROSIGHT PO TABS
1.0000 | ORAL_TABLET | Freq: Every day | ORAL | Status: DC
  Administered 2016-05-17: 1 via ORAL
  Filled 2016-05-16: qty 1

## 2016-05-16 MED ORDER — SODIUM CHLORIDE 0.9 % IV SOLN: 10.0000 mL/h | Freq: Once | INTRAVENOUS | Status: DC

## 2016-05-16 MED ORDER — LEFLUNOMIDE 20 MG PO TABS
20.0000 mg | ORAL_TABLET | Freq: Every day | ORAL | Status: DC
  Administered 2016-05-17: 20 mg via ORAL
  Filled 2016-05-16: qty 1

## 2016-05-16 MED ORDER — ACETAMINOPHEN 650 MG RE SUPP: 650.0000 mg | Freq: Four times a day (QID) | RECTAL | Status: DC | PRN

## 2016-05-16 MED ORDER — ONDANSETRON HCL 4 MG PO TABS: 4.0000 mg | ORAL_TABLET | Freq: Four times a day (QID) | ORAL | Status: DC | PRN

## 2016-05-16 MED ORDER — SODIUM CHLORIDE 0.9 % IV SOLN
Freq: Once | INTRAVENOUS | Status: AC
  Administered 2016-05-16: 23:00:00 via INTRAVENOUS

## 2016-05-16 NOTE — ED Provider Notes (Addendum)
[facility] DEPT Provider Note   CSN: [id number] Arrival date & time: 05/16/16  1723     History   Chief Complaint Chief Complaint  Patient presents with  . Shortness of Breath    HPI [name] is a 71 y.o. female.She complains of shortness of breath worsened with walking improved with rest onset one week ago, gradually. She denies any chest pain denies fever or cough. She contacted her cardiologist Dr. Stanford Breed week. Xarelto was HPI Discontinued Cardizem dose was increased on 02/07/20018, however she feels no change. No other associated symptoms. Past Medical History:  Diagnosis Date  . Actinic keratosis   . Anemia    hx of  . Anemia of chronic renal failure, stage 3 (moderate) 07/17/2015  . Anticoagulant causing adverse effect in therapeutic use 07/17/2015  . Anxiety   . Atrial fibrillation ([place])   . Atrial fibrillation ([place])   . Carpal tunnel syndrome   . Chest pain   . Depression   . Edema   . Gastroesophageal reflux disease   . Hepatitis 1970  . Hiatal hernia   . Hyperlipidemia   . Hypertension   . Iron deficiency anemia due to chronic blood loss 07/17/2015  . Jaundice    age 75  . Memory loss   . Peripheral vascular disease ([facility])    DVT left lower leg  . Rheumatoid arthritis(714.0)   . Thyroid disease    multi nodular goiter  . Type II diabetes mellitus [facility])     Patient Active Problem List   Diagnosis Date Noted  . History of GI bleed 03/29/2016  . Chronic anticoagulation 03/29/2016  . Symptomatic anemia 03/19/2016  . Shortness of breath   . Spinal stenosis of lumbar region 02/10/2016  . Swelling of limb 01/13/2016  . Varicose veins of left leg with edema 09/15/2015  . Preop cardiovascular exam 08/25/2015  . Iron deficiency anemia due to chronic blood loss 07/17/2015  . Anemia of chronic renal failure, stage 3 (moderate) 07/17/2015  . Anticoagulant causing adverse effect in therapeutic use 07/17/2015  . Symptomatic bradycardia 02/21/2015  .  Bradycardia   . Acute deep vein thrombosis (DVT) of left lower extremity ([place]) 05/06/2014  . Abdominal wall mass of right lower quadrant 07/08/2013  . OA (osteoarthritis) of hip 04/03/2013  . Osteoarthritis of hip 01/18/2013  . Night sweats 09/06/2012  . Chest pain   . Permanent atrial fibrillation ([place])   . Anxiety and depression   . Gastroesophageal reflux disease   . Hepatitis   . Jaundice   . Hypertension 07/01/2010  . Hypercholesterolemia 07/01/2010  . DM2 (diabetes mellitus, type 2) ([place]) 07/01/2010  . Rheumatoid arthritis ([place]) 07/01/2010  . Sinus tachycardia 07/01/2010  . Chest pain, atypical 07/01/2010    Past Surgical History:  Procedure Laterality Date  . CATARACT EXTRACTION Left   . COLONOSCOPY WITH PROPOFOL N/A 02/20/2015   Procedure: COLONOSCOPY WITH PROPOFOL;  Surgeon: Carol Ada, MD;  Location: [facility];  Service: Endoscopy;  Laterality: N/A;  . ENTEROSCOPY N/A 03/23/2016   Procedure: ENTEROSCOPY;  Surgeon: Carol Ada, MD;  Location: [facility];  Service: Endoscopy;  Laterality: N/A;  . ESOPHAGOGASTRODUODENOSCOPY (EGD) WITH PROPOFOL N/A 02/20/2015   Procedure: ESOPHAGOGASTRODUODENOSCOPY (EGD) WITH PROPOFOL;  Surgeon: Carol Ada, MD;  Location: [facility];  Service: Endoscopy;  Laterality: N/A;  . GIVENS CAPSULE STUDY N/A 03/21/2016   Procedure: GIVENS CAPSULE STUDY;  Surgeon: Carol Ada, MD;  Location: [facility];  Service: Endoscopy;  Laterality: N/A;  . HAND SURGERY  1995 and 1996   artificial joints both hands  . JOINT REPLACEMENT    . LUMBAR LAMINECTOMY/DECOMPRESSION MICRODISCECTOMY N/A 02/10/2016   Procedure: MICROLUMBAR DECOMPRESSION L4-L5 AND L3- L4, AND EXCISION OF SYNOVIAL CYST L4-L5;  Surgeon: Susa Day, MD;  Location: [facility];  Service: Orthopedics;  Laterality: N/A;  . TOTAL HIP ARTHROPLASTY Left 2009  . TOTAL HIP ARTHROPLASTY Right 04/03/2013   Procedure: RIGHT TOTAL HIP ARTHROPLASTY ANTERIOR APPROACH;  Surgeon: Gearlean Alf,  MD;  Location: [facility];  Service: Orthopedics;  Laterality: Right;    OB History    No data available       Home Medications    Prior to Admission medications   Medication Sig Start Date End Date Taking? Authorizing Provider  buPROPion (WELLBUTRIN SR) 150 MG 12 hr tablet Take 150 mg by mouth 2 (two) times daily.     Yes Historical Provider, MD  Cholecalciferol (VITAMIN D3) 1000 units CAPS Take 1,000 Units by mouth daily.   Yes Historical Provider, MD  Cyanocobalamin (VITAMIN B 12 PO) Take 1,000 mcg by mouth daily.    Yes Historical Provider, MD  diltiazem (CARDIZEM CD) 360 MG 24 hr capsule Take 1 capsule (360 mg total) by mouth daily. 05/11/16  Yes Luke K Kilroy, PA-C  esomeprazole (NEXIUM) 40 MG capsule Take 40 mg by mouth daily.     Yes Historical Provider, MD  fluticasone (FLONASE) 50 MCG/ACT nasal spray Place 1-2 sprays into both nostrils daily as needed for allergies.  04/12/15  Yes Historical Provider, MD  furosemide (LASIX) 20 MG tablet TAKE 2 TABLETS DAILY 04/05/16  Yes Lelon Perla, MD  glimepiride (AMARYL) 2 MG tablet Take 2 mg by mouth every morning. 11/03/14  Yes Historical Provider, MD  leflunomide (ARAVA) 20 MG tablet Take 20 mg by mouth daily.  04/26/13  Yes Historical Provider, MD  metFORMIN (GLUCOPHAGE-XR) 500 MG 24 hr tablet Take 1,000 mg by mouth 2 (two) times daily. 09/02/14  Yes Historical Provider, MD  metoprolol (LOPRESSOR) 50 MG tablet Take 2 tablets (100 mg total) by mouth 2 (two) times daily. 05/11/16  Yes Luke K Kilroy, PA-C  Omega 3 1200 MG CAPS Take 1 capsule by mouth daily.   Yes Historical Provider, MD  ORENCIA 125 MG/ML SOSY Inject 125 mg into the skin every 7 (seven) days. Thurs. 01/27/15  Yes Historical Provider, MD  potassium chloride (KLOR-CON 10) 10 MEQ tablet Take 2 tablets (20 mEq total) by mouth 2 (two) times daily. 01/04/16  Yes Lelon Perla, MD  predniSONE (DELTASONE) 5 MG tablet Take 5 mg by mouth daily.   Yes Historical Provider, MD  rosuvastatin  (CRESTOR) 10 MG tablet Take 10 mg by mouth daily.    Yes Historical Provider, MD  Multiple Vitamins-Minerals (PRESERVISION AREDS) CAPS Take 1 capsule by mouth 2 (two) times daily.    Historical Provider, MD  nitroGLYCERIN (NITROSTAT) 0.4 MG SL tablet Place 0.4 mg under the tongue every 5 (five) minutes as needed for chest pain (x 3 doses).     Historical Provider, MD  rivaroxaban (XARELTO) 20 MG TABS tablet Take 20 mg by mouth daily with supper.    Historical Provider, MD    Family History Family History  Problem Relation Age of Onset  . Stroke Father   . Heart failure Sister   . Hypertension Sister   . Heart attack Neg Hx     Social History Social History  Substance Use Topics  . Smoking status: Never Smoker  .  Smokeless tobacco: Never Used  . Alcohol use No     Allergies   Patient has no known allergies.   Review of Systems Review of Systems  Constitutional: Negative.   HENT: Negative.   Respiratory: Positive for shortness of breath.   Cardiovascular: Negative.   Gastrointestinal: Negative.   Musculoskeletal: Negative.   Skin: Negative.   Neurological: Negative.   Psychiatric/Behavioral: Negative.   All other systems reviewed and are negative.    Physical Exam Updated Vital Signs BP 117/67   Pulse 109   Temp 97.5 F (36.4 C) (Oral)   Resp 21   SpO2 97%   Physical Exam  Constitutional: She appears well-developed and well-nourished.  HENT:  Head: Normocephalic and atraumatic.  Eyes: Conjunctivae are normal. Pupils are equal, round, and reactive to light.  Neck: Neck supple. No tracheal deviation present. No thyromegaly present.  Cardiovascular:  No murmur heard. Mildly tachycardic irregularly irregular  Pulmonary/Chest: Effort normal and breath sounds normal.  Abdominal: Soft. Bowel sounds are normal. She exhibits no distension. There is no tenderness.  Genitourinary: Rectal exam shows guaiac negative stool.  Genitourinary Comments: Rectal normal tone  brown stool no gross blood  Musculoskeletal: Normal range of motion. She exhibits no edema or tenderness.  Neurological: She is alert. Coordination normal.  Skin: Skin is warm and dry. No rash noted.  Psychiatric: She has a normal mood and affect.  Nursing note and vitals reviewed.    ED Treatments / Results  Labs (all labs ordered are listed, but only abnormal results are displayed) Labs Reviewed  BASIC METABOLIC PANEL - Abnormal; Notable for the following:       Result Value   Glucose, Bld 104 (*)    All other components within normal limits  CBC - Abnormal; Notable for the following:    RBC 3.82 (*)    Hemoglobin 7.4 (*)    HCT 26.6 (*)    MCV 69.6 (*)    MCH 19.4 (*)    MCHC 27.8 (*)    RDW 20.5 (*)    All other components within normal limits  POC OCCULT BLOOD, ED  TYPE AND SCREEN  PREPARE RBC (CROSSMATCH)    EKG  EKG Interpretation  Date/Time:  Monday May 16 2016 17:40:53 EST Ventricular Rate:  106 PR Interval:    QRS Duration: 82 QT Interval:  356 QTC Calculation: 473 R Axis:   66 Text Interpretation:  Atrial fibrillation Low voltage, precordial leads Minimal ST depression, anterolateral leads Baseline wander in lead(s) II III aVF since last tracing no significant change Confirmed by BELFI  MD, MELANIE ([id number]) on 05/16/2016 5:56:47 PM       Radiology Dg Chest 2 View  Result Date: 05/16/2016 CLINICAL DATA:  Shortness of breath for 2 weeks, chronic dry cough, history atrial fibrillation, hypertension, diabetes mellitus, pneumonia EXAM: CHEST  2 VIEW COMPARISON:  03/19/2016 FINDINGS: Enlargement of cardiac silhouette with pulmonary vascular congestion. Mediastinal contours normal. Slight chronic accentuation of perihilar markings, stable. No definite acute infiltrate, pneumothorax or acute osseous findings. Blunting of posterior costophrenic angles by tiny effusions. IMPRESSION: Enlargement of cardiac silhouette with pulmonary vascular congestion. Tiny pleural  effusions without pneumothorax. Electronically Signed   By: Lavonia Dana M.D.   On: 05/16/2016 18:15    Procedures Procedures (including critical care time)  Medications Ordered in ED Medications  0.9 %  sodium chloride infusion (not administered)   Results for orders placed or performed during the hospital encounter of [id number]  Basic metabolic panel  Result Value Ref Range   Sodium 141 135 - 145 mmol/L   Potassium 4.2 3.5 - 5.1 mmol/L   Chloride 106 101 - 111 mmol/L   CO2 24 22 - 32 mmol/L   Glucose, Bld 104 (H) 65 - 99 mg/dL   BUN 14 6 - 20 mg/dL   Creatinine, Ser 0.67 0.44 - 1.00 mg/dL   Calcium 8.9 8.9 - 10.3 mg/dL   GFR calc non Af Amer >60 >60 mL/min   GFR calc Af Amer >60 >60 mL/min   Anion gap 11 5 - 15  CBC  Result Value Ref Range   WBC 9.0 4.0 - 10.5 K/uL   RBC 3.82 (L) 3.87 - 5.11 MIL/uL   Hemoglobin 7.4 (L) 12.0 - 15.0 g/dL   HCT 26.6 (L) 36.0 - 46.0 %   MCV 69.6 (L) 78.0 - 100.0 fL   MCH 19.4 (L) 26.0 - 34.0 pg   MCHC 27.8 (L) 30.0 - 36.0 g/dL   RDW 20.5 (H) 11.5 - 15.5 %   Platelets 316 150 - 400 K/uL   Dg Chest 2 View  Result Date: 05/16/2016 CLINICAL DATA:  Shortness of breath for 2 weeks, chronic dry cough, history atrial fibrillation, hypertension, diabetes mellitus, pneumonia EXAM: CHEST  2 VIEW COMPARISON:  03/19/2016 FINDINGS: Enlargement of cardiac silhouette with pulmonary vascular congestion. Mediastinal contours normal. Slight chronic accentuation of perihilar markings, stable. No definite acute infiltrate, pneumothorax or acute osseous findings. Blunting of posterior costophrenic angles by tiny effusions. IMPRESSION: Enlargement of cardiac silhouette with pulmonary vascular congestion. Tiny pleural effusions without pneumothorax. Electronically Signed   By: Lavonia Dana M.D.   On: 05/16/2016 18:15    Patient felt to have symptomatic anemia and that her hemoglobin drop coincides with dyspnea. She does have a history of DVT. D-dimer ordered. She may  need further studies if d-dimer positive. I've consulted Dr.Opyd who will arrange for overnight stay and check on d-dimer. I've also ordered transfusion with packed red cells Initial Impression / Assessment and Plan / ED Course  I have reviewed the triage vital signs and the nursing notes.  Pertinent labs & imaging results that were available during my care of the patient were reviewed by me and considered in my medical decision making (see chart for details).       Final Clinical Impressions(s) / ED Diagnoses  Dx acute dyspnea #2 anemia Final diagnoses:  None    New Prescriptions New Prescriptions   No medications on file     Orlie Dakin, MD 05/16/16 2129    Orlie Dakin, MD 05/16/16 2130

## 2016-05-16 NOTE — Telephone Encounter (Signed)
Pt of Dr. Stanford Breed  Seen by Lurena Joiner on 2/9. Pt experiencing worsening SOB, onset of which was about 2 weeks ago. Per pt, she was worse over weekend, now having difficulty ambulating short distances. Wants to know if advised to go to the ER.  I had called her on Friday to discuss CBC, other labs. Hgb was 7.8 at that time. Advised then if symptoms worse at any point, [facility] recommended ED evaluation.  As instructed, she had her CBC rechecked today, unfortunately these labs are still pending. I stated that as her symptoms are worse now, concern may be that the hemoglobin is lower, unfortunately we don't have the lab values to be able to definitely know, but ED evaluation would be strongly advised.  Recommended pt go to [facility]. She refused to go to [facility], states she will go to [facility] instead. She did voice understanding to go. Informed her I would make care team aware.

## 2016-05-16 NOTE — ED Triage Notes (Signed)
Pt has hx of a-fib.  Has had increased shortness of breath x 2 weeks.  MD is aware of this.  MD took her off her xarelto and told her to increase her cardizem.  Pt called MD and told to come here.  No chest pain.  Some cough.  No fever.

## 2016-05-16 NOTE — H&P (Signed)
History and Physical    [name] [id number] DOB: 07-May-1945 DOA: 05/16/2016  PCP: Tula Nakayama   Patient coming from: Home  Chief Complaint: Dyspnea on exertion   HPI: [name] is a 71 y.o. female with medical history significant for chronic atrial fibrillation on Xarelto, type 2 diabetes mellitus, hypertension, and rheumatoid arthritis presenting with progressive dyspnea on exertion. Patient was admitted to the hospital in December 2017 with symptomatic anemia and stool positive for occult blood. She underwent extensive GI workup during that admission with a small, nonbleeding gastric ulcer noted on EGD, and a small bowel AVM noted on capsule, but not seen on enteroscopy. She was discharged in improved and stable condition with Xarelto resumed. Since that time, she continued to do well until she began to note exertional dyspnea over a week ago. This has progressively worsened and she was evaluated by her cardiologist on 05/13/2016. Xarelto was stopped during that visit and her diltiazem was increased. She denies any recent fevers or chills and denies abdominal pain, nausea, melena, or hematochezia. There has been no chest pain and no cough. Patient denies headaches, change in vision or hearing, or focal numbness or weakness. She denies alcohol use and has not been using NSAIDs. She takes a low-dose prednisone daily.  ED Course: Upon arrival to the ED, patient is found to be afebrile, saturating well on room air, mildly tachycardic, and with vitals otherwise stable. EKG features in atrial fibrillation with rate 106. Chest x-ray is notable for increased cardiac silhouette with pulmonary vascular congestion and tiny pleural effusions. CBC is notable for a hemoglobin of 7.4 and MCV of 69.6. Hemoglobin was 9.6 at time of hospital discharge in late December 2017. BMP is unremarkable. D-dimer is negative when adjusted for age. One unit of packed red blood cells were ordered for  immediate transfusion by the ED physician. Patient remained hemodynamically stable with no observed bleeding. She will be admitted to the telemetry unit for ongoing evaluation and management of symptomatic anemia.  Review of Systems:  All other systems reviewed and apart from HPI, are negative.  Past Medical History:  Diagnosis Date  . Actinic keratosis   . Anemia    hx of  . Anemia of chronic renal failure, stage 3 (moderate) 07/17/2015  . Anticoagulant causing adverse effect in therapeutic use 07/17/2015  . Anxiety   . Atrial fibrillation ([place])   . Atrial fibrillation ([place])   . Carpal tunnel syndrome   . Chest pain   . Depression   . Edema   . Gastroesophageal reflux disease   . Hepatitis 1970  . Hiatal hernia   . Hyperlipidemia   . Hypertension   . Iron deficiency anemia due to chronic blood loss 07/17/2015  . Jaundice    age 65  . Memory loss   . Peripheral vascular disease (HCC)    DVT left lower leg  . Rheumatoid arthritis(714.0)   . Thyroid disease    multi nodular goiter  . Type II diabetes mellitus ([place])     Past Surgical History:  Procedure Laterality Date  . CATARACT EXTRACTION Left   . COLONOSCOPY WITH PROPOFOL N/A 02/20/2015   Procedure: COLONOSCOPY WITH PROPOFOL;  Surgeon: Carol Ada, MD;  Location: [facility];  Service: Endoscopy;  Laterality: N/A;  . ENTEROSCOPY N/A 03/23/2016   Procedure: ENTEROSCOPY;  Surgeon: Carol Ada, MD;  Location: [facility];  Service: Endoscopy;  Laterality: N/A;  . ESOPHAGOGASTRODUODENOSCOPY (EGD) WITH PROPOFOL N/A 02/20/2015   Procedure: ESOPHAGOGASTRODUODENOSCOPY (EGD)  WITH PROPOFOL;  Surgeon: Carol Ada, MD;  Location: [facility];  Service: Endoscopy;  Laterality: N/A;  . GIVENS CAPSULE STUDY N/A 03/21/2016   Procedure: GIVENS CAPSULE STUDY;  Surgeon: Carol Ada, MD;  Location: [facility];  Service: Endoscopy;  Laterality: N/A;  . HAND SURGERY  1995 and 1996   artificial joints both hands  . JOINT REPLACEMENT     . LUMBAR LAMINECTOMY/DECOMPRESSION MICRODISCECTOMY N/A 02/10/2016   Procedure: MICROLUMBAR DECOMPRESSION L4-L5 AND L3- L4, AND EXCISION OF SYNOVIAL CYST L4-L5;  Surgeon: Susa Day, MD;  Location: [facility];  Service: Orthopedics;  Laterality: N/A;  . TOTAL HIP ARTHROPLASTY Left 2009  . TOTAL HIP ARTHROPLASTY Right 04/03/2013   Procedure: RIGHT TOTAL HIP ARTHROPLASTY ANTERIOR APPROACH;  Surgeon: Gearlean Alf, MD;  Location: [facility];  Service: Orthopedics;  Laterality: Right;     reports that she has never smoked. She has never used smokeless tobacco. She reports that she does not drink alcohol or use drugs.  No Known Allergies  Family History  Problem Relation Age of Onset  . Stroke Father   . Heart failure Sister   . Hypertension Sister   . Heart attack Neg Hx      Prior to Admission medications   Medication Sig Start Date End Date Taking? Authorizing Provider  buPROPion (WELLBUTRIN SR) 150 MG 12 hr tablet Take 150 mg by mouth 2 (two) times daily.     Yes Historical Provider, MD  Cholecalciferol (VITAMIN D3) 1000 units CAPS Take 1,000 Units by mouth daily.   Yes Historical Provider, MD  Cyanocobalamin (VITAMIN B 12 PO) Take 1,000 mcg by mouth daily.    Yes Historical Provider, MD  diltiazem (CARDIZEM CD) 360 MG 24 hr capsule Take 1 capsule (360 mg total) by mouth daily. 05/11/16  Yes Luke K Kilroy, PA-C  esomeprazole (NEXIUM) 40 MG capsule Take 40 mg by mouth daily.     Yes Historical Provider, MD  fluticasone (FLONASE) 50 MCG/ACT nasal spray Place 1-2 sprays into both nostrils daily as needed for allergies.  04/12/15  Yes Historical Provider, MD  furosemide (LASIX) 20 MG tablet TAKE 2 TABLETS DAILY 04/05/16  Yes Lelon Perla, MD  glimepiride (AMARYL) 2 MG tablet Take 2 mg by mouth every morning. 11/03/14  Yes Historical Provider, MD  leflunomide (ARAVA) 20 MG tablet Take 20 mg by mouth daily.  04/26/13  Yes Historical Provider, MD  metFORMIN (GLUCOPHAGE-XR) 500 MG 24 hr tablet Take  1,000 mg by mouth 2 (two) times daily. 09/02/14  Yes Historical Provider, MD  metoprolol (LOPRESSOR) 50 MG tablet Take 2 tablets (100 mg total) by mouth 2 (two) times daily. 05/11/16  Yes Luke K Kilroy, PA-C  Omega 3 1200 MG CAPS Take 1 capsule by mouth daily.   Yes Historical Provider, MD  ORENCIA 125 MG/ML SOSY Inject 125 mg into the skin every 7 (seven) days. Thurs. 01/27/15  Yes Historical Provider, MD  potassium chloride (KLOR-CON 10) 10 MEQ tablet Take 2 tablets (20 mEq total) by mouth 2 (two) times daily. 01/04/16  Yes Lelon Perla, MD  predniSONE (DELTASONE) 5 MG tablet Take 5 mg by mouth daily.   Yes Historical Provider, MD  rosuvastatin (CRESTOR) 10 MG tablet Take 10 mg by mouth daily.    Yes Historical Provider, MD  Multiple Vitamins-Minerals (PRESERVISION AREDS) CAPS Take 1 capsule by mouth 2 (two) times daily.    Historical Provider, MD  nitroGLYCERIN (NITROSTAT) 0.4 MG SL tablet Place 0.4 mg under the tongue  every 5 (five) minutes as needed for chest pain (x 3 doses).     Historical Provider, MD  rivaroxaban (XARELTO) 20 MG TABS tablet Take 20 mg by mouth daily with supper.    Historical Provider, MD    Physical Exam: Vitals:   05/16/16 1736 05/16/16 2028 05/16/16 2030  BP: 127/71 119/77 117/67  Pulse: 115 100 109  Resp: 18 19 21   Temp: 98.3 F (36.8 C) 97.5 F (36.4 C)   TempSrc: Oral Oral   SpO2: 98% 94% 97%      Constitutional: NAD, calm, comfortable, pale Eyes: PERTLA, lids and conjunctivae normal ENMT: Mucous membranes are moist. Posterior pharynx clear of any exudate or lesions.   Neck: normal, supple, no masses, no thyromegaly Respiratory: clear to auscultation bilaterally, no wheezing. Normal respiratory effort. No accessory muscle use.  Cardiovascular: Rate ~100 and irregular. Trace pretibial edema b/l. No significant JVD. Abdomen: No distension, no tenderness, no masses palpated. Bowel sounds normal.  Musculoskeletal: no clubbing / cyanosis. No joint  deformity upper and lower extremities. Normal muscle tone.  Skin: no significant rashes, lesions, ulcers. Warm, dry, well-perfused. Pale.  Neurologic: CN 2-12 grossly intact. Sensation intact, DTR normal. Strength 5/5 in all 4 limbs.  Psychiatric: Normal judgment and insight. Alert and oriented x 3. Normal mood and affect.     Labs on Admission: I have personally reviewed following labs and imaging studies  CBC:  Recent Labs Lab 05/11/16 0938 05/16/16 1810  WBC 11.2* 9.0  HGB 7.8* 7.4*  HCT 27.2* 26.6*  MCV 71.4* 69.6*  PLT 336 [id number]   Basic Metabolic Panel:  Recent Labs Lab 05/11/16 0938 05/16/16 1810  NA 140 141  K 4.4 4.2  CL 104 106  CO2 25 24  GLUCOSE 145* 104*  BUN 21 14  CREATININE 0.75 0.67  CALCIUM 9.6 8.9   GFR: Estimated Creatinine Clearance: 71.1 mL/min (by C-G formula based on SCr of 0.67 mg/dL). Liver Function Tests: No results for input(s): AST, ALT, ALKPHOS, BILITOT, PROT, ALBUMIN in the last 168 hours. No results for input(s): LIPASE, AMYLASE in the last 168 hours. No results for input(s): AMMONIA in the last 168 hours. Coagulation Profile: No results for input(s): INR, PROTIME in the last 168 hours. Cardiac Enzymes: No results for input(s): CKTOTAL, CKMB, CKMBINDEX, TROPONINI in the last 168 hours. BNP (last 3 results) No results for input(s): PROBNP in the last 8760 hours. HbA1C: No results for input(s): HGBA1C in the last 72 hours. CBG: No results for input(s): GLUCAP in the last 168 hours. Lipid Profile: No results for input(s): CHOL, HDL, LDLCALC, TRIG, CHOLHDL, LDLDIRECT in the last 72 hours. Thyroid Function Tests: No results for input(s): TSH, T4TOTAL, FREET4, T3FREE, THYROIDAB in the last 72 hours. Anemia Panel: No results for input(s): VITAMINB12, FOLATE, FERRITIN, TIBC, IRON, RETICCTPCT in the last 72 hours. Urine analysis:    Component Value Date/Time   COLORURINE YELLOW 10/09/2014 0040   APPEARANCEUR CLEAR 10/09/2014 0040    LABSPEC 1.030 10/09/2014 0040   PHURINE 6.0 10/09/2014 0040   GLUCOSEU 100 (A) 10/09/2014 0040   HGBUR NEGATIVE 10/09/2014 0040   BILIRUBINUR NEGATIVE 10/09/2014 0040   KETONESUR NEGATIVE 10/09/2014 0040   PROTEINUR 30 (A) 10/09/2014 0040   UROBILINOGEN 1.0 10/09/2014 0040   NITRITE NEGATIVE 10/09/2014 0040   LEUKOCYTESUR NEGATIVE 10/09/2014 0040   Sepsis Labs: @LABRCNTIP (procalcitonin:4,lacticidven:4) )No results found for this or any previous visit (from the past 240 hour(s)).   Radiological Exams on Admission: Dg Chest 2 View  Result Date: 05/16/2016 CLINICAL DATA:  Shortness of breath for 2 weeks, chronic dry cough, history atrial fibrillation, hypertension, diabetes mellitus, pneumonia EXAM: CHEST  2 VIEW COMPARISON:  03/19/2016 FINDINGS: Enlargement of cardiac silhouette with pulmonary vascular congestion. Mediastinal contours normal. Slight chronic accentuation of perihilar markings, stable. No definite acute infiltrate, pneumothorax or acute osseous findings. Blunting of posterior costophrenic angles by tiny effusions. IMPRESSION: Enlargement of cardiac silhouette with pulmonary vascular congestion. Tiny pleural effusions without pneumothorax. Electronically Signed   By: Lavonia Dana M.D.   On: 05/16/2016 18:15    EKG: Independently reviewed. Atrial fibrillation (rate 106).   Assessment/Plan  1. Symptomatic anemia  - Pt presents with progressive DOE, found to have Hgb 7.4, down from 9.6 in December 2017  - Underwent extensive GI evaluation in 03/2016 with identification of small non-bleeding gastric ulcer, non-bleeding cecal AVM, and a small-bowel AVM seen on capsule study but not enteroscopy  - She had been back on Xarelto until 05/13/16 when instructed by cardiologist to stop  - Pt was mildly tachycardic (in AF) in ED, but with stable BP  - 1 unit of pRBC ordered for transfusion; check post-transfusion H&H  - Given gastric ulcer on recent EGD, she will be treated with Protonix  40 mg IV q12h   2. Chronic atrial fibrillation  - Pt is in atrial fibrillation on admission - CHADS-VASc at least 4 (age, gender, DM, HTN) - Xarelto on hold since 05/13/16, will continue holding  - Continue diltiazem and metoprolol as tolerated   3. Hypertension - BP at goal on admission - Cautiously continuing Lopressor   4. Type II DM  - A1c was 6.0% in November 2017  - Managed with glimepiride and metformin at home; these are held on admission - Check CBG with meals and qHS  - Start a low-intensity SSI    5. Rheumatoid arthritis  - Stable  - Continue daily prednisone 5 mg, leflunomide     DVT prophylaxis: SCD's  Code Status: Full  Family Communication: Discussed with patient  Disposition Plan: Admit to telemetry  Consults called: None Admission status: Inpatient    Vianne Bulls, MD [facility] Pager [phone]  If 7PM-7AM, please contact night-coverage [email or web address] Password [email or web address]  05/16/2016, 10:10 PM

## 2016-05-16 NOTE — Telephone Encounter (Signed)
New Message     Pt c/o Shortness Of Breath: STAT if SOB developed within the last 24 hours or pt is noticeably SOB on the phone  1. Are you currently SOB (can you hear that pt is SOB on the phone)? yes  2. How long have you been experiencing SOB? 2 weeks   3. Are you SOB when sitting or when up moving around? yes  4. Are you currently experiencing any other symptoms?  Shortness of breath getting worse , no energy   Pt wants to know if she should go to er

## 2016-05-16 NOTE — Telephone Encounter (Signed)
Agree with ER eval [name]  

## 2016-05-17 DIAGNOSIS — E11319 Type 2 diabetes mellitus with unspecified diabetic retinopathy without macular edema: Secondary | ICD-10-CM | POA: Diagnosis not present

## 2016-05-17 DIAGNOSIS — M05752 Rheumatoid arthritis with rheumatoid factor of left hip without organ or systems involvement: Secondary | ICD-10-CM | POA: Diagnosis not present

## 2016-05-17 DIAGNOSIS — D649 Anemia, unspecified: Secondary | ICD-10-CM | POA: Diagnosis not present

## 2016-05-17 DIAGNOSIS — D509 Iron deficiency anemia, unspecified: Secondary | ICD-10-CM | POA: Diagnosis not present

## 2016-05-17 DIAGNOSIS — E1151 Type 2 diabetes mellitus with diabetic peripheral angiopathy without gangrene: Secondary | ICD-10-CM | POA: Diagnosis not present

## 2016-05-17 DIAGNOSIS — I482 Chronic atrial fibrillation: Secondary | ICD-10-CM | POA: Diagnosis not present

## 2016-05-17 DIAGNOSIS — M069 Rheumatoid arthritis, unspecified: Secondary | ICD-10-CM | POA: Diagnosis not present

## 2016-05-17 DIAGNOSIS — R0609 Other forms of dyspnea: Secondary | ICD-10-CM | POA: Diagnosis not present

## 2016-05-17 DIAGNOSIS — F418 Other specified anxiety disorders: Secondary | ICD-10-CM | POA: Diagnosis not present

## 2016-05-17 DIAGNOSIS — N183 Chronic kidney disease, stage 3 (moderate): Secondary | ICD-10-CM | POA: Diagnosis not present

## 2016-05-17 DIAGNOSIS — E1122 Type 2 diabetes mellitus with diabetic chronic kidney disease: Secondary | ICD-10-CM | POA: Diagnosis not present

## 2016-05-17 DIAGNOSIS — I10 Essential (primary) hypertension: Secondary | ICD-10-CM | POA: Diagnosis not present

## 2016-05-17 LAB — GLUCOSE, CAPILLARY
GLUCOSE-CAPILLARY: 55 mg/dL — AB (ref 65–99)
GLUCOSE-CAPILLARY: 84 mg/dL (ref 65–99)
Glucose-Capillary: 48 mg/dL — ABNORMAL LOW (ref 65–99)
Glucose-Capillary: 77 mg/dL (ref 65–99)
Glucose-Capillary: 85 mg/dL (ref 65–99)

## 2016-05-17 LAB — OCCULT BLOOD, POC DEVICE: Fecal Occult Bld: NEGATIVE

## 2016-05-17 LAB — BASIC METABOLIC PANEL
ANION GAP: 10 (ref 5–15)
BUN: 12 mg/dL (ref 6–20)
CALCIUM: 8.7 mg/dL — AB (ref 8.9–10.3)
CHLORIDE: 105 mmol/L (ref 101–111)
CO2: 24 mmol/L (ref 22–32)
Creatinine, Ser: 0.57 mg/dL (ref 0.44–1.00)
GFR calc non Af Amer: >60 mL/min (ref >60)
Glucose, Bld: 129 mg/dL — ABNORMAL HIGH (ref 65–99)
Potassium: 4 mmol/L (ref 3.5–5.1)
Sodium: 139 mmol/L (ref 135–145)

## 2016-05-17 LAB — CBC
HCT: 26.1 % — ABNORMAL LOW (ref 35.0–45.0)
Hemoglobin: 7.2 g/dL — ABNORMAL LOW (ref 11.7–15.5)
MCH: 19.7 pg — ABNORMAL LOW (ref 27.0–33.0)
MCHC: 27.6 g/dL — ABNORMAL LOW (ref 32.0–36.0)
MCV: 71.3 fL — ABNORMAL LOW (ref 80.0–100.0)
MPV: 9.8 fL (ref 7.5–12.5)
Platelets: 311 10*3/uL (ref 140–400)
RBC: 3.66 MIL/uL — ABNORMAL LOW (ref 3.80–5.10)
RDW: 20.5 % — ABNORMAL HIGH (ref 11.0–15.0)
WBC: 9.4 10*3/uL (ref 3.8–10.8)

## 2016-05-17 LAB — HEMOGLOBIN AND HEMATOCRIT, BLOOD
HEMATOCRIT: 26.4 % — AB (ref 36.0–46.0)
HEMOGLOBIN: 7.7 g/dL — AB (ref 12.0–15.0)

## 2016-05-17 LAB — OCCULT BLOOD X 1 CARD TO LAB, STOOL: FECAL OCCULT BLD: NEGATIVE

## 2016-05-17 MED ORDER — OMEGA-3-ACID ETHYL ESTERS 1 G PO CAPS
1000.0000 mg | ORAL_CAPSULE | Freq: Every day | ORAL | Status: DC
  Administered 2016-05-17: 1000 mg via ORAL
  Filled 2016-05-17: qty 1

## 2016-05-17 MED ORDER — FERROUS GLUCONATE 324 (38 FE) MG PO TABS: 324.0000 mg | ORAL_TABLET | Freq: Two times a day (BID) | ORAL | Status: DC

## 2016-05-17 MED ORDER — SODIUM CHLORIDE 0.9 % IV BOLUS (SEPSIS)
500.0000 mL | Freq: Once | INTRAVENOUS | Status: AC
  Administered 2016-05-17: 500 mL via INTRAVENOUS

## 2016-05-17 MED ORDER — FERROUS GLUCONATE 324 (38 FE) MG PO TABS: 324.0000 mg | ORAL_TABLET | Freq: Two times a day (BID) | ORAL | 3 refills | Status: DC

## 2016-05-17 MED ORDER — SODIUM CHLORIDE 0.9 % IV SOLN
510.0000 mg | Freq: Once | INTRAVENOUS | Status: AC
  Administered 2016-05-17: 510 mg via INTRAVENOUS
  Filled 2016-05-17: qty 17

## 2016-05-17 NOTE — Progress Notes (Addendum)
CBG 48, pt refused dextrose push, apple juice given instead despite being NPO. Pt states she has not eaten since this morning. CBG up to 77. Will continue to monitor and follow CBGs per order.  NP on-call paged, order changed to clear liquids as there are no tests scheduled in am. Will continue to monitor.

## 2016-05-17 NOTE — Progress Notes (Signed)
Hypoglycemic Event  CBG: 55  Treatment: 15 GM carbohydrate snack  Symptoms: None  Follow-up CBG: [id number] CBG Result:85  Possible Reasons for Event: Inadequate meal intake  Comments/MD notified: K.Schorr, NP notified    Adah Salvage

## 2016-05-17 NOTE — Progress Notes (Signed)
BP 79/59 prior to blood administration, NP on-call notified. Fluid bolus ordered, told to hold off on blood until BP stabilizes. Will continue to monitor

## 2016-05-17 NOTE — Discharge Summary (Signed)
Physician Discharge Summary  [name] [id number] DOB: [birth year] DOA: 05/16/2016  PCP: Tula Nakayama  Admit date: 05/16/2016 Discharge date: 05/17/2016  Admitted From: home Disposition:  home   Recommendations for Outpatient Follow-up:  1. F/u Hb, Iron panel monthly- she has received Feraheme 510 mg IV today- can give second dose in 2 wks  Home Health:  none  Equipment/Devices:  none    Discharge Condition:  stable   CODE STATUS:  Full code   Diet recommendation:  Heart healthy Consultations:  none    Discharge Diagnoses:  Principal Problem:   Symptomatic anemia Active Problems:   Hypertension   DM2 (diabetes mellitus, type 2) (HCC)   Rheumatoid arthritis ([place])   Permanent atrial fibrillation (HCC)   Anxiety and depression    Subjective: No complaints.  Brief Summary: [name] is a 71 y.o. female with medical history significant for chronic atrial fibrillation on Xarelto, type 2 diabetes mellitus, hypertension, and rheumatoid arthritis presenting with progressive dyspnea on exertion. Patient was admitted to the hospital in December 2017 with symptomatic anemia and stool positive for occult blood. She underwent a GI workup during that admission and found to have small, nonbleeding gastric ulcer noted on EGD and a small bowel AVM noted on capsule, but not seen on enteroscopy. She was discharged in improved and stable condition with Xarelto resumed.   Since that time, she continued to do well until she began to note exertional dyspnea over a week ago. This has progressively worsened and she was evaluated by her cardiologist on 05/13/2016. Xarelto was stopped and her diltiazem was increased. She denies any recent fevers or chills and denies abdominal pain, nausea, melena, or hematochezia. There has been no chest pain and no cough. Patient denies headaches, change in vision or hearing, or focal numbness or weakness. She denies alcohol use and has not been using  NSAIDs. She takes a low-dose prednisone daily.  Found to have Hb of 7.4. Stool hemoccult negative in ER. Received a dose of Iron on last admission via IF. Prescribed oral Iron in the past but for some reason, she stopped it.   Hospital Course:  Symptomatic anemia - Iron deficient- hemoccult neg x 2 - has received 1 U PRBC and Feraheme today - stool hemoccult negative in ER yesterday but not documented. I have rechecked it today and it is negative.  - resume oral Iron tabs- she can choose to buy Ferrous Sulfate over the counter as she has in the past or fill the prescription for Fergon I have prescribed - f/u Hb and Iron levels monthly as outpt  Chronic atrial fibrillation  - Pt is in atrial fibrillation on admission - CHADS-VASc at least 4 (age, gender, DM, HTN) - Xarelto on hold since 05/13/16, will resume today as 2 hemoccults are negative and anemia is likely due to Iron deficiency - Continue diltiazem and metoprolol as tolerated     Hypertension - BP at goal on admission - Cautiously continuing Lopressor    Type II DM  - A1c was 6.0% in November 2017  - Managed with glimepiride and metformin at home; these are held on admission      Rheumatoid arthritis  - Stable  - Continue daily prednisone 5 mg, leflunomide    Discharge Instructions  Discharge Instructions    Diet - low sodium heart healthy    Complete by:  As directed    Diet Carb Modified    Complete by:  As directed  Increase activity slowly    Complete by:  As directed      Allergies as of 05/17/2016   No Known Allergies     Medication List    TAKE these medications   buPROPion 150 MG 12 hr tablet Commonly known as:  WELLBUTRIN SR Take 150 mg by mouth 2 (two) times daily.   diltiazem 360 MG 24 hr capsule Commonly known as:  CARDIZEM CD Take 1 capsule (360 mg total) by mouth daily.   esomeprazole 40 MG capsule Commonly known as:  NEXIUM Take 40 mg by mouth daily.   ferrous gluconate 324 MG  tablet Commonly known as:  FERGON Take 1 tablet (324 mg total) by mouth 2 (two) times daily with a meal.   fluticasone 50 MCG/ACT nasal spray Commonly known as:  FLONASE Place 1-2 sprays into both nostrils daily as needed for allergies.   furosemide 20 MG tablet Commonly known as:  LASIX TAKE 2 TABLETS DAILY   glimepiride 2 MG tablet Commonly known as:  AMARYL Take 2 mg by mouth every morning.   leflunomide 20 MG tablet Commonly known as:  ARAVA Take 20 mg by mouth daily.   metFORMIN 500 MG 24 hr tablet Commonly known as:  GLUCOPHAGE-XR Take 1,000 mg by mouth 2 (two) times daily.   metoprolol 50 MG tablet Commonly known as:  LOPRESSOR Take 2 tablets (100 mg total) by mouth 2 (two) times daily.   nitroGLYCERIN 0.4 MG SL tablet Commonly known as:  NITROSTAT Place 0.4 mg under the tongue every 5 (five) minutes as needed for chest pain (x 3 doses).   Omega 3 1200 MG Caps Take 1 capsule by mouth daily.   ORENCIA 125 MG/ML Sosy Generic drug:  Abatacept Inject 125 mg into the skin every 7 (seven) days. Thurs.   potassium chloride 10 MEQ tablet Commonly known as:  KLOR-CON 10 Take 2 tablets (20 mEq total) by mouth 2 (two) times daily.   predniSONE 5 MG tablet Commonly known as:  DELTASONE Take 5 mg by mouth daily.   PRESERVISION AREDS Caps Take 1 capsule by mouth 2 (two) times daily.   rivaroxaban 20 MG Tabs tablet Commonly known as:  XARELTO Take 20 mg by mouth daily with supper.   rosuvastatin 10 MG tablet Commonly known as:  CRESTOR Take 10 mg by mouth daily.   VITAMIN B 12 PO Take 1,000 mcg by mouth daily.   Vitamin D3 1000 units Caps Take 1,000 Units by mouth daily.       No Known Allergies   Procedures/Studies:    Dg Chest 2 View  Result Date: 05/16/2016 CLINICAL DATA:  Shortness of breath for 2 weeks, chronic dry cough, history atrial fibrillation, hypertension, diabetes mellitus, pneumonia EXAM: CHEST  2 VIEW COMPARISON:  03/19/2016 FINDINGS:  Enlargement of cardiac silhouette with pulmonary vascular congestion. Mediastinal contours normal. Slight chronic accentuation of perihilar markings, stable. No definite acute infiltrate, pneumothorax or acute osseous findings. Blunting of posterior costophrenic angles by tiny effusions. IMPRESSION: Enlargement of cardiac silhouette with pulmonary vascular congestion. Tiny pleural effusions without pneumothorax. Electronically Signed   By: Lavonia Dana M.D.   On: 05/16/2016 18:15       Discharge Exam: Vitals:   05/17/16 0800 05/17/16 1001  BP: 134/70 139/74  Pulse: (!) 110 78  Resp:    Temp: 97.9 F (36.6 C)    Vitals:   05/17/16 0305 05/17/16 0530 05/17/16 0800 05/17/16 1001  BP: (!) 118/58 120/70 134/70 139/74  Pulse:  100 96 (!) 110 78  Resp: 16 16    Temp: 98.2 F (36.8 C) 98 F (36.7 C) 97.9 F (36.6 C)   TempSrc: Oral Oral Oral   SpO2: 98% 100% 100% 99%  Weight:      Height:        General: Pt is alert, awake, not in acute distress Cardiovascular: RRR, S1/S2 +, no rubs, no gallops Respiratory: CTA bilaterally, no wheezing, no rhonchi Abdominal: Soft, NT, ND, bowel sounds + Extremities: no edema, no cyanosis    The results of significant diagnostics from this hospitalization (including imaging, microbiology, ancillary and laboratory) are listed below for reference.     Microbiology: No results found for this or any previous visit (from the past 240 hour(s)).   Labs: BNP (last 3 results)  Recent Labs  03/19/16 1740 05/11/16 0938  BNP 227.9* [id number]*   Basic Metabolic Panel:  Recent Labs Lab 05/11/16 0938 05/16/16 1810 05/17/16 0806  NA 140 141 139  K 4.4 4.2 4.0  CL 104 106 105  CO2 25 24 24   GLUCOSE 145* 104* 129*  BUN 21 14 12   CREATININE 0.75 0.67 0.57  CALCIUM 9.6 8.9 8.7*   Liver Function Tests: No results for input(s): AST, ALT, ALKPHOS, BILITOT, PROT, ALBUMIN in the last 168 hours. No results for input(s): LIPASE, AMYLASE in the last 168  hours. No results for input(s): AMMONIA in the last 168 hours. CBC:  Recent Labs Lab 05/11/16 0938 05/16/16 1126 05/16/16 1810 05/17/16 0806  WBC 11.2* 9.4 9.0  --   HGB 7.8* 7.2* 7.4* 7.7*  HCT 27.2* 26.1* 26.6* 26.4*  MCV 71.4* 71.3* 69.6*  --   PLT 336 311 316  --    Cardiac Enzymes: No results for input(s): CKTOTAL, CKMB, CKMBINDEX, TROPONINI in the last 168 hours. BNP: Invalid input(s): POCBNP CBG:  Recent Labs Lab 05/17/16 0034 05/17/16 0057 05/17/16 0430 05/17/16 0456 05/17/16 0727  GLUCAP 48* 77 55* 85 84   D-Dimer  Recent Labs  05/16/16 2240  DDIMER 0.61*   Hgb A1c No results for input(s): HGBA1C in the last 72 hours. Lipid Profile No results for input(s): CHOL, HDL, LDLCALC, TRIG, CHOLHDL, LDLDIRECT in the last 72 hours. Thyroid function studies  Recent Labs  05/16/16 1825  TSH 1.330   Anemia work up No results for input(s): VITAMINB12, FOLATE, FERRITIN, TIBC, IRON, RETICCTPCT in the last 72 hours. Urinalysis    Component Value Date/Time   COLORURINE YELLOW 10/09/2014 0040   APPEARANCEUR CLEAR 10/09/2014 0040   LABSPEC 1.030 10/09/2014 0040   PHURINE 6.0 10/09/2014 0040   GLUCOSEU 100 (A) 10/09/2014 0040   HGBUR NEGATIVE 10/09/2014 0040   BILIRUBINUR NEGATIVE 10/09/2014 0040   KETONESUR NEGATIVE 10/09/2014 0040   PROTEINUR 30 (A) 10/09/2014 0040   UROBILINOGEN 1.0 10/09/2014 0040   NITRITE NEGATIVE 10/09/2014 0040   LEUKOCYTESUR NEGATIVE 10/09/2014 0040   Sepsis Labs Invalid input(s): PROCALCITONIN,  WBC,  LACTICIDVEN Microbiology No results found for this or any previous visit (from the past 240 hour(s)).   Time coordinating discharge: Over 30 minutes  SIGNED:   Debbe Odea, MD  [facility] 05/17/2016, 11:09 AM Pager   If 7PM-7AM, please contact night-coverage [email or web address] Password [email or web address]

## 2016-05-18 LAB — TYPE AND SCREEN
BLOOD PRODUCT EXPIRATION DATE: 201803072359
ISSUE DATE / TIME: 201802130240
Unit Type and Rh: 9500

## 2016-05-19 DIAGNOSIS — R195 Other fecal abnormalities: Secondary | ICD-10-CM | POA: Diagnosis not present

## 2016-05-19 DIAGNOSIS — I482 Chronic atrial fibrillation: Secondary | ICD-10-CM | POA: Diagnosis not present

## 2016-05-19 DIAGNOSIS — D500 Iron deficiency anemia secondary to blood loss (chronic): Secondary | ICD-10-CM | POA: Diagnosis not present

## 2016-05-20 ENCOUNTER — Encounter (HOSPITAL_COMMUNITY): Payer: Self-pay | Admitting: Emergency Medicine

## 2016-05-20 ENCOUNTER — Other Ambulatory Visit: Payer: Self-pay | Admitting: Gastroenterology

## 2016-05-20 ENCOUNTER — Inpatient Hospital Stay (HOSPITAL_COMMUNITY)
Admission: EM | Admit: 2016-05-20 | Discharge: 2016-05-26 | DRG: 871 | Disposition: A | Discharge status: Home Health | Payer: Medicare Other | Attending: Internal Medicine | Admitting: Internal Medicine

## 2016-05-20 ENCOUNTER — Emergency Department (HOSPITAL_COMMUNITY): Payer: Medicare Other

## 2016-05-20 DIAGNOSIS — J101 Influenza due to other identified influenza virus with other respiratory manifestations: Secondary | ICD-10-CM | POA: Diagnosis present

## 2016-05-20 DIAGNOSIS — R05 Cough: Secondary | ICD-10-CM | POA: Diagnosis not present

## 2016-05-20 DIAGNOSIS — F32A Depression, unspecified: Secondary | ICD-10-CM | POA: Diagnosis present

## 2016-05-20 DIAGNOSIS — I5031 Acute diastolic (congestive) heart failure: Secondary | ICD-10-CM | POA: Diagnosis not present

## 2016-05-20 DIAGNOSIS — Z823 Family history of stroke: Secondary | ICD-10-CM

## 2016-05-20 DIAGNOSIS — R0602 Shortness of breath: Secondary | ICD-10-CM

## 2016-05-20 DIAGNOSIS — Z7901 Long term (current) use of anticoagulants: Secondary | ICD-10-CM

## 2016-05-20 DIAGNOSIS — F419 Anxiety disorder, unspecified: Secondary | ICD-10-CM | POA: Diagnosis present

## 2016-05-20 DIAGNOSIS — E11319 Type 2 diabetes mellitus with unspecified diabetic retinopathy without macular edema: Secondary | ICD-10-CM | POA: Diagnosis not present

## 2016-05-20 DIAGNOSIS — Z86718 Personal history of other venous thrombosis and embolism: Secondary | ICD-10-CM

## 2016-05-20 DIAGNOSIS — Z8249 Family history of ischemic heart disease and other diseases of the circulatory system: Secondary | ICD-10-CM | POA: Diagnosis not present

## 2016-05-20 DIAGNOSIS — T501X5A Adverse effect of loop [high-ceiling] diuretics, initial encounter: Secondary | ICD-10-CM | POA: Diagnosis not present

## 2016-05-20 DIAGNOSIS — K759 Inflammatory liver disease, unspecified: Secondary | ICD-10-CM | POA: Diagnosis present

## 2016-05-20 DIAGNOSIS — E7800 Pure hypercholesterolemia, unspecified: Secondary | ICD-10-CM | POA: Diagnosis present

## 2016-05-20 DIAGNOSIS — J810 Acute pulmonary edema: Secondary | ICD-10-CM | POA: Diagnosis not present

## 2016-05-20 DIAGNOSIS — J9801 Acute bronchospasm: Secondary | ICD-10-CM | POA: Diagnosis present

## 2016-05-20 DIAGNOSIS — M069 Rheumatoid arthritis, unspecified: Secondary | ICD-10-CM | POA: Diagnosis present

## 2016-05-20 DIAGNOSIS — I482 Chronic atrial fibrillation, unspecified: Secondary | ICD-10-CM | POA: Diagnosis present

## 2016-05-20 DIAGNOSIS — E876 Hypokalemia: Secondary | ICD-10-CM | POA: Diagnosis not present

## 2016-05-20 DIAGNOSIS — I10 Essential (primary) hypertension: Secondary | ICD-10-CM | POA: Diagnosis not present

## 2016-05-20 DIAGNOSIS — D500 Iron deficiency anemia secondary to blood loss (chronic): Secondary | ICD-10-CM | POA: Diagnosis present

## 2016-05-20 DIAGNOSIS — E785 Hyperlipidemia, unspecified: Secondary | ICD-10-CM | POA: Diagnosis present

## 2016-05-20 DIAGNOSIS — I110 Hypertensive heart disease with heart failure: Secondary | ICD-10-CM | POA: Diagnosis present

## 2016-05-20 DIAGNOSIS — I501 Left ventricular failure: Secondary | ICD-10-CM | POA: Diagnosis not present

## 2016-05-20 DIAGNOSIS — E11649 Type 2 diabetes mellitus with hypoglycemia without coma: Secondary | ICD-10-CM | POA: Diagnosis present

## 2016-05-20 DIAGNOSIS — I509 Heart failure, unspecified: Secondary | ICD-10-CM

## 2016-05-20 DIAGNOSIS — Z96643 Presence of artificial hip joint, bilateral: Secondary | ICD-10-CM | POA: Diagnosis present

## 2016-05-20 DIAGNOSIS — M05752 Rheumatoid arthritis with rheumatoid factor of left hip without organ or systems involvement: Secondary | ICD-10-CM

## 2016-05-20 DIAGNOSIS — Z7952 Long term (current) use of systemic steroids: Secondary | ICD-10-CM

## 2016-05-20 DIAGNOSIS — E119 Type 2 diabetes mellitus without complications: Secondary | ICD-10-CM

## 2016-05-20 DIAGNOSIS — E1151 Type 2 diabetes mellitus with diabetic peripheral angiopathy without gangrene: Secondary | ICD-10-CM | POA: Diagnosis present

## 2016-05-20 DIAGNOSIS — E1159 Type 2 diabetes mellitus with other circulatory complications: Secondary | ICD-10-CM | POA: Diagnosis present

## 2016-05-20 DIAGNOSIS — Z7984 Long term (current) use of oral hypoglycemic drugs: Secondary | ICD-10-CM

## 2016-05-20 DIAGNOSIS — F329 Major depressive disorder, single episode, unspecified: Secondary | ICD-10-CM | POA: Diagnosis present

## 2016-05-20 DIAGNOSIS — I5033 Acute on chronic diastolic (congestive) heart failure: Secondary | ICD-10-CM | POA: Diagnosis present

## 2016-05-20 DIAGNOSIS — A419 Sepsis, unspecified organism: Secondary | ICD-10-CM | POA: Diagnosis not present

## 2016-05-20 DIAGNOSIS — K219 Gastro-esophageal reflux disease without esophagitis: Secondary | ICD-10-CM | POA: Diagnosis present

## 2016-05-20 DIAGNOSIS — E1169 Type 2 diabetes mellitus with other specified complication: Secondary | ICD-10-CM

## 2016-05-20 DIAGNOSIS — Z79899 Other long term (current) drug therapy: Secondary | ICD-10-CM

## 2016-05-20 DIAGNOSIS — F418 Other specified anxiety disorders: Secondary | ICD-10-CM | POA: Diagnosis not present

## 2016-05-20 DIAGNOSIS — Z452 Encounter for adjustment and management of vascular access device: Secondary | ICD-10-CM | POA: Diagnosis not present

## 2016-05-20 DIAGNOSIS — I4891 Unspecified atrial fibrillation: Secondary | ICD-10-CM | POA: Diagnosis not present

## 2016-05-20 LAB — COMPREHENSIVE METABOLIC PANEL
ALK PHOS: 115 U/L (ref 38–126)
ALT: 13 U/L — ABNORMAL LOW (ref 14–54)
ANION GAP: 12 (ref 5–15)
AST: 33 U/L (ref 15–41)
Albumin: 3.1 g/dL — ABNORMAL LOW (ref 3.5–5.0)
BUN: 11 mg/dL (ref 6–20)
CALCIUM: 8.6 mg/dL — AB (ref 8.9–10.3)
CO2: 19 mmol/L — AB (ref 22–32)
Chloride: 104 mmol/L (ref 101–111)
Creatinine, Ser: 0.64 mg/dL (ref 0.44–1.00)
Glucose, Bld: 120 mg/dL — ABNORMAL HIGH (ref 65–99)
Potassium: 3.7 mmol/L (ref 3.5–5.1)
SODIUM: 135 mmol/L (ref 135–145)
Total Bilirubin: 0.9 mg/dL (ref 0.3–1.2)
Total Protein: 6.2 g/dL — ABNORMAL LOW (ref 6.5–8.1)

## 2016-05-20 LAB — I-STAT TROPONIN, ED: TROPONIN I, POC: 0.02 ng/mL (ref 0.00–0.08)

## 2016-05-20 LAB — CBC WITH DIFFERENTIAL/PLATELET
BASOS PCT: 0 %
Basophils Absolute: 0 10*3/uL (ref 0.0–0.1)
EOS ABS: 0.1 10*3/uL (ref 0.0–0.7)
Eosinophils Relative: 1 %
HCT: 29.2 % — ABNORMAL LOW (ref 36.0–46.0)
HEMOGLOBIN: 8.5 g/dL — AB (ref 12.0–15.0)
Lymphocytes Relative: 6 %
Lymphs Abs: 0.6 10*3/uL — ABNORMAL LOW (ref 0.7–4.0)
MCH: 21.8 pg — AB (ref 26.0–34.0)
MCHC: 29.1 g/dL — AB (ref 30.0–36.0)
MCV: 74.9 fL — ABNORMAL LOW (ref 78.0–100.0)
MONO ABS: 0.6 10*3/uL (ref 0.1–1.0)
Monocytes Relative: 6 %
NEUTROS ABS: 8.4 10*3/uL — AB (ref 1.7–7.7)
Neutrophils Relative %: 87 %
PLATELETS: 235 10*3/uL (ref 150–400)
RBC: 3.9 MIL/uL (ref 3.87–5.11)
RDW: 24.9 % — ABNORMAL HIGH (ref 11.5–15.5)
WBC: 9.7 10*3/uL (ref 4.0–10.5)

## 2016-05-20 LAB — I-STAT VENOUS BLOOD GAS, ED
ACID-BASE DEFICIT: 1 mmol/L (ref 0.0–2.0)
Bicarbonate: 22.4 mmol/L (ref 20.0–28.0)
O2 Saturation: 96 %
PO2 VEN: 72 mmHg — AB (ref 32.0–45.0)
TCO2: 23 mmol/L (ref 0–100)
pCO2, Ven: 29.9 mmHg — ABNORMAL LOW (ref 44.0–60.0)
pH, Ven: 7.482 — ABNORMAL HIGH (ref 7.250–7.430)

## 2016-05-20 LAB — TYPE AND SCREEN
ABO/RH(D): O NEG
ANTIBODY SCREEN: NEGATIVE

## 2016-05-20 LAB — CBG MONITORING, ED: GLUCOSE-CAPILLARY: 125 mg/dL — AB (ref 65–99)

## 2016-05-20 LAB — TROPONIN I: Troponin I: 0.03 ng/mL (ref <0.03)

## 2016-05-20 LAB — I-STAT CG4 LACTIC ACID, ED: LACTIC ACID, VENOUS: 1.99 mmol/L — AB (ref 0.5–1.9)

## 2016-05-20 LAB — LACTIC ACID, PLASMA: LACTIC ACID, VENOUS: 1.3 mmol/L (ref 0.5–1.9)

## 2016-05-20 LAB — GLUCOSE, CAPILLARY
GLUCOSE-CAPILLARY: 32 mg/dL — AB (ref 65–99)
Glucose-Capillary: 112 mg/dL — ABNORMAL HIGH (ref 65–99)

## 2016-05-20 LAB — TSH: TSH: 0.817 u[IU]/mL (ref 0.350–4.500)

## 2016-05-20 LAB — BRAIN NATRIURETIC PEPTIDE: B Natriuretic Peptide: 349.7 pg/mL — ABNORMAL HIGH (ref 0.0–100.0)

## 2016-05-20 MED ORDER — PANTOPRAZOLE SODIUM 40 MG PO TBEC
40.0000 mg | DELAYED_RELEASE_TABLET | Freq: Every day | ORAL | Status: DC
  Administered 2016-05-21 – 2016-05-26 (×6): 40 mg via ORAL
  Filled 2016-05-20 (×5): qty 1

## 2016-05-20 MED ORDER — ACETAMINOPHEN 325 MG PO TABS
650.0000 mg | ORAL_TABLET | Freq: Four times a day (QID) | ORAL | Status: DC | PRN
  Administered 2016-05-20: 650 mg via ORAL
  Filled 2016-05-20: qty 2

## 2016-05-20 MED ORDER — ONDANSETRON HCL 4 MG/2ML IJ SOLN: 4.0000 mg | Freq: Four times a day (QID) | INTRAMUSCULAR | Status: DC | PRN

## 2016-05-20 MED ORDER — FLUTICASONE PROPIONATE 50 MCG/ACT NA SUSP: 1.0000 | Freq: Every day | NASAL | Status: DC | PRN

## 2016-05-20 MED ORDER — SODIUM CHLORIDE 0.9% FLUSH: 3.0000 mL | INTRAVENOUS | Status: DC | PRN

## 2016-05-20 MED ORDER — SODIUM CHLORIDE 0.9 % IV SOLN: 250.0000 mL | INTRAVENOUS | Status: DC | PRN

## 2016-05-20 MED ORDER — DIGOXIN 0.25 MG/ML IJ SOLN
0.2500 mg | Freq: Once | INTRAMUSCULAR | Status: AC
  Administered 2016-05-20: 0.25 mg via INTRAVENOUS
  Filled 2016-05-20: qty 2

## 2016-05-20 MED ORDER — INSULIN ASPART 100 UNIT/ML ~~LOC~~ SOLN
0.0000 [IU] | Freq: Three times a day (TID) | SUBCUTANEOUS | Status: DC
  Administered 2016-05-22: 3 [IU] via SUBCUTANEOUS
  Administered 2016-05-22: 7 [IU] via SUBCUTANEOUS

## 2016-05-20 MED ORDER — ACETAMINOPHEN 650 MG RE SUPP: 650.0000 mg | Freq: Four times a day (QID) | RECTAL | Status: DC | PRN

## 2016-05-20 MED ORDER — ROSUVASTATIN CALCIUM 10 MG PO TABS
10.0000 mg | ORAL_TABLET | Freq: Every day | ORAL | Status: DC
  Administered 2016-05-21 – 2016-05-26 (×6): 10 mg via ORAL
  Filled 2016-05-20 (×6): qty 1

## 2016-05-20 MED ORDER — ONDANSETRON HCL 4 MG/2ML IJ SOLN
INTRAMUSCULAR | Status: AC
  Filled 2016-05-20: qty 2

## 2016-05-20 MED ORDER — ONDANSETRON HCL 4 MG PO TABS: 4.0000 mg | ORAL_TABLET | Freq: Four times a day (QID) | ORAL | Status: DC | PRN

## 2016-05-20 MED ORDER — SODIUM CHLORIDE 0.9% FLUSH
3.0000 mL | Freq: Two times a day (BID) | INTRAVENOUS | Status: DC
  Administered 2016-05-20 – 2016-05-26 (×5): 3 mL via INTRAVENOUS

## 2016-05-20 MED ORDER — DEXTROSE 50 % IV SOLN
50.0000 mL | Freq: Once | INTRAVENOUS | Status: AC
  Administered 2016-05-20: 50 mL via INTRAVENOUS

## 2016-05-20 MED ORDER — DEXTROSE 50 % IV SOLN
INTRAVENOUS | Status: AC
  Administered 2016-05-20: 50 mL via INTRAVENOUS
  Filled 2016-05-20: qty 50

## 2016-05-20 MED ORDER — DILTIAZEM HCL 100 MG IV SOLR
5.0000 mg/h | INTRAVENOUS | Status: DC
  Administered 2016-05-20: 5 mg/h via INTRAVENOUS
  Administered 2016-05-21 (×2): 15 mg/h via INTRAVENOUS
  Filled 2016-05-20 (×4): qty 100

## 2016-05-20 MED ORDER — POTASSIUM CHLORIDE CRYS ER 10 MEQ PO TBCR
20.0000 meq | EXTENDED_RELEASE_TABLET | Freq: Two times a day (BID) | ORAL | Status: DC
  Administered 2016-05-21: 20 meq via ORAL
  Filled 2016-05-20: qty 2

## 2016-05-20 MED ORDER — HYDROCODONE-ACETAMINOPHEN 5-325 MG PO TABS
1.0000 | ORAL_TABLET | ORAL | Status: DC | PRN
  Administered 2016-05-21 (×2): 1 via ORAL
  Filled 2016-05-20 (×2): qty 1

## 2016-05-20 MED ORDER — RIVAROXABAN 20 MG PO TABS
20.0000 mg | ORAL_TABLET | Freq: Every day | ORAL | Status: DC
  Administered 2016-05-21 – 2016-05-25 (×5): 20 mg via ORAL
  Filled 2016-05-20 (×6): qty 1

## 2016-05-20 MED ORDER — BISACODYL 5 MG PO TBEC
5.0000 mg | DELAYED_RELEASE_TABLET | Freq: Every day | ORAL | Status: DC | PRN
  Filled 2016-05-20: qty 1

## 2016-05-20 MED ORDER — ORAL CARE MOUTH RINSE
15.0000 mL | Freq: Two times a day (BID) | OROMUCOSAL | Status: DC
  Administered 2016-05-21 – 2016-05-22 (×3): 15 mL via OROMUCOSAL

## 2016-05-20 MED ORDER — VITAMIN D 1000 UNITS PO TABS
1000.0000 [IU] | ORAL_TABLET | Freq: Every day | ORAL | Status: DC
  Administered 2016-05-21 – 2016-05-26 (×6): 1000 [IU] via ORAL
  Filled 2016-05-20 (×6): qty 1

## 2016-05-20 MED ORDER — LORAZEPAM 2 MG/ML IJ SOLN
0.5000 mg | INTRAMUSCULAR | Status: DC | PRN
  Administered 2016-05-21 – 2016-05-24 (×3): 0.5 mg via INTRAVENOUS
  Filled 2016-05-20 (×3): qty 1

## 2016-05-20 MED ORDER — VANCOMYCIN HCL IN DEXTROSE 1-5 GM/200ML-% IV SOLN
1000.0000 mg | Freq: Two times a day (BID) | INTRAVENOUS | Status: DC
  Administered 2016-05-21: 1000 mg via INTRAVENOUS
  Filled 2016-05-20: qty 200

## 2016-05-20 MED ORDER — DILTIAZEM LOAD VIA INFUSION
15.0000 mg | Freq: Once | INTRAVENOUS | Status: AC
  Administered 2016-05-20: 15 mg via INTRAVENOUS
  Filled 2016-05-20: qty 15

## 2016-05-20 MED ORDER — FERROUS GLUCONATE 324 (38 FE) MG PO TABS
324.0000 mg | ORAL_TABLET | Freq: Two times a day (BID) | ORAL | Status: DC
  Administered 2016-05-21: 324 mg via ORAL
  Filled 2016-05-20 (×2): qty 1

## 2016-05-20 MED ORDER — OMEGA-3-ACID ETHYL ESTERS 1 G PO CAPS
1000.0000 mg | ORAL_CAPSULE | Freq: Every day | ORAL | Status: DC
  Administered 2016-05-21 – 2016-05-26 (×6): 1000 mg via ORAL
  Filled 2016-05-20 (×5): qty 1

## 2016-05-20 MED ORDER — BUPROPION HCL ER (SR) 150 MG PO TB12
150.0000 mg | ORAL_TABLET | Freq: Two times a day (BID) | ORAL | Status: DC
  Administered 2016-05-21 – 2016-05-26 (×11): 150 mg via ORAL
  Filled 2016-05-20 (×11): qty 1

## 2016-05-20 MED ORDER — PIPERACILLIN-TAZOBACTAM 3.375 G IVPB 30 MIN
3.3750 g | Freq: Once | INTRAVENOUS | Status: AC
  Administered 2016-05-20: 3.375 g via INTRAVENOUS
  Filled 2016-05-20: qty 50

## 2016-05-20 MED ORDER — SODIUM CHLORIDE 0.9% FLUSH
3.0000 mL | Freq: Two times a day (BID) | INTRAVENOUS | Status: DC
  Administered 2016-05-20 – 2016-05-21 (×2): 3 mL via INTRAVENOUS

## 2016-05-20 MED ORDER — VANCOMYCIN HCL IN DEXTROSE 1-5 GM/200ML-% IV SOLN
1000.0000 mg | Freq: Once | INTRAVENOUS | Status: AC
  Administered 2016-05-20: 1000 mg via INTRAVENOUS
  Filled 2016-05-20: qty 200

## 2016-05-20 MED ORDER — MORPHINE SULFATE (PF) 4 MG/ML IV SOLN
1.0000 mg | INTRAVENOUS | Status: DC | PRN
  Administered 2016-05-21: 2 mg via INTRAVENOUS
  Filled 2016-05-20: qty 1

## 2016-05-20 MED ORDER — POLYETHYLENE GLYCOL 3350 17 G PO PACK: 17.0000 g | PACK | Freq: Every day | ORAL | Status: DC | PRN

## 2016-05-20 MED ORDER — INSULIN ASPART 100 UNIT/ML ~~LOC~~ SOLN
0.0000 [IU] | Freq: Every day | SUBCUTANEOUS | Status: DC
  Administered 2016-05-21: 5 [IU] via SUBCUTANEOUS

## 2016-05-20 MED ORDER — PIPERACILLIN-TAZOBACTAM 3.375 G IVPB
3.3750 g | Freq: Three times a day (TID) | INTRAVENOUS | Status: DC
  Administered 2016-05-21 – 2016-05-24 (×11): 3.375 g via INTRAVENOUS
  Filled 2016-05-20 (×12): qty 50

## 2016-05-20 MED ORDER — ONDANSETRON HCL 4 MG/2ML IJ SOLN
4.0000 mg | Freq: Once | INTRAMUSCULAR | Status: AC
  Administered 2016-05-20: 4 mg via INTRAVENOUS

## 2016-05-20 NOTE — Progress Notes (Signed)
Pharmacy Antibiotic Note  [name] is a 71 y.o. female admitted on 05/20/2016 with sepsis. Pt presents with SOB requiring BiPAP, lactate elevated. Pharmacy has been consulted for vancomycin and Zosyn dosing.  Plan: -Vancomycin 1000mg  IV x1 then 1000mg  IV q12hr -Zosyn 3.375g IV over 30 min x1 then 3.375g IV q8h EI -Monitor renal function, cultures, LOT -Obtain vancomycin level as indicated     Temp (24hrs), Avg:102.3 F (39.1 C), Min:102.3 F (39.1 C), Max:102.3 F (39.1 C)   Recent Labs Lab 05/16/16 1126 05/16/16 1810 05/17/16 0806 05/20/16 1937 05/20/16 1948  WBC 9.4 9.0  --  9.7  --   CREATININE  --  0.67 0.57 0.64  --   LATICACIDVEN  --   --   --   --  1.99*    Estimated Creatinine Clearance: 70.6 mL/min (by C-G formula based on SCr of 0.64 mg/dL).    No Known Allergies  Antimicrobials this admission: 2/16 Zosyn >>  2/16 Vancomycin >>   Dose adjustments this admission: none  Microbiology results: 2/16 BCx: IP 2/16 UCx: IP  Thank you for allowing pharmacy to be a part of this patient's care.  Arrie Senate, PharmD PGY-1 Pharmacy Resident Pager: [phone] 05/20/2016

## 2016-05-20 NOTE — Progress Notes (Signed)
Patient admitted to 4N04 from ED, report from [facility]. Full assessment to EPIC. Remains in afib, rate 110s-130s; dilt gtt infusing, 98% on 4L [place], borderline febrile 100.43F.   Blanchable redness noted to sacrum, prophylactic sacral foam dressing placed. Coarse crackles and diminished expiratory wheezes auscultated bilaterally across lung fields; +1 pitting edema to BLEs. Pt is alert and oriented to time & situation, disoriented to place: believes she is at [facility].   Pt CBG on arrival to room 32, confirmed by recheck; D50 given per protocol. Pt denies any s/sx of hypoglycemia, though per RN assessment patient is drowsy and falls asleep immediately after conversation. Pt had incontinent episode during transfer to room, UA unable to be collected at this time, will attempt again.   Oriented to room, call bell, staff & fall safety plan; bed in low position, yellow non-skid socks in place, bed alarm on. Will continue to monitor closely.

## 2016-05-20 NOTE — Progress Notes (Signed)
RT NOTE:  Pt complaining of nausea, BIPAP removed. Pt tolerating 3L [place] well at this time. Will start BIPAP again if needed.

## 2016-05-20 NOTE — Progress Notes (Signed)
Hypoglycemic Event  CBG: 32  Treatment: D50 IV 50 mL  Symptoms: drowsy  Follow-up CBG: Time: 2330 CBG Result: 112  Possible Reasons for Event: Unknown   [name]

## 2016-05-20 NOTE — ED Triage Notes (Signed)
Per EMS, pt c/o SOB.  Upon arrival heart rate was 220, given 10 card (no effect), another 10mg  card (some improvement), placed on 5mg /hr Card drip which controled her at 130.  She was given breathing treatment of alb., some rails head, placed on c-pap given another 5 of alb.  Edema in L foot/leg.  CBG 200.

## 2016-05-20 NOTE — H&P (Signed)
History and Physical    [name] [id number] DOB: 21-Sep-1945 DOA: 05/20/2016  PCP: Tula Nakayama   Patient coming from: Home  Chief Complaint: SOB, fever  HPI: [name] is a 71 y.o. female with medical history significant for atrial fibrillation, iron deficiency anemia with recent transfusion, depression with anxiety, type 2 diabetes mellitus, and rheumatoid arthritis presenting to the hospital for evaluation of severe and worsening dyspnea with fever. Patient was discharged from the hospital on 05/17/2016 following evaluation and management of symptomatic anemia. She was transfused 1 unit during that admission, had serial negative FOBT's, and was restarted on Xarelto at time of discharge. She had initially done well back at home, but developed subjective fevers and chills with dyspnea and productive cough yesterday. Dyspnea worsened significantly today to the point where she was having difficulty catching her breath while at rest. She denies any associated chest pain or hemoptysis. She denies any abdominal pain, diarrhea, vomiting, dysuria, or flank pain. She denies rhinorrhea or sore throat. There has been no melena or hematochezia. Patient activated EMS for transport to the hospital and was found to have heart rate close to 200 upon EMS arrival. She was given 10 mg of diltiazem 2 with improvement and was started on diltiazem infusion. She was also given albuterol neb treatment and placed on CPAP en route.  ED Course: Upon arrival to the ED, patient is found to be febrile to 39.1 C, saturating well on BiPAP, tachycardic to 140, and with stable blood pressure. EKG features an atrial tachyarrhythmia with rate 135 and chest x-ray features mild cardiomegaly and pulmonary vascular congestion with bibasilar airspace opacities likely representing mild interstitial edema. Chemistry panel is largely unremarkable and CBC is notable for a stable hemoglobin of 8.5 with MCV of 74.9. Lactic  acid is at the borderline of elevation at 1.99 and BNP is elevated to 350. Patient was given 15 mg IV push diltiazem and started on diltiazem infusion in the emergency department. She was also treated with Zofran and, after blood cultures were obtained, Zosyn and vancomycin. Influenza PCR and urinalysis have been ordered and remained pending. Patient was successfully weaned to nasal cannula, but continues to have respiratory distress and heart rate in the 130s. She will be admitted to the stepdown unit for ongoing evaluation and management of suspected sepsis with atrial fibrillation and RVR.  Review of Systems:  All other systems reviewed and apart from HPI, are negative.  Past Medical History:  Diagnosis Date  . Actinic keratosis   . Anemia    hx of  . Anemia of chronic renal failure, stage 3 (moderate) 07/17/2015  . Anticoagulant causing adverse effect in therapeutic use 07/17/2015  . Anxiety   . Atrial fibrillation ([place])   . Atrial fibrillation ([place])   . Carpal tunnel syndrome   . Chest pain   . Depression   . Edema   . Gastroesophageal reflux disease   . Hepatitis 1970  . Hiatal hernia   . Hyperlipidemia   . Hypertension   . Iron deficiency anemia due to chronic blood loss 07/17/2015  . Jaundice    age 17  . Memory loss   . Peripheral vascular disease (H[facility])    DVT left lower leg  . Rheumatoid arthritis(714.0)   . Thyroid disease    multi nodular goiter  . Type II diabetes mellitus ([place])     Past Surgical History:  Procedure Laterality Date  . CATARACT EXTRACTION Left   . COLONOSCOPY WITH PROPOFOL  N/A 02/20/2015   Procedure: COLONOSCOPY WITH PROPOFOL;  Surgeon: Carol Ada, MD;  Location: [facility];  Service: Endoscopy;  Laterality: N/A;  . ENTEROSCOPY N/A 03/23/2016   Procedure: ENTEROSCOPY;  Surgeon: Carol Ada, MD;  Location: [facility];  Service: Endoscopy;  Laterality: N/A;  . ESOPHAGOGASTRODUODENOSCOPY (EGD) WITH PROPOFOL N/A 02/20/2015   Procedure:  ESOPHAGOGASTRODUODENOSCOPY (EGD) WITH PROPOFOL;  Surgeon: Carol Ada, MD;  Location: [facility];  Service: Endoscopy;  Laterality: N/A;  . GIVENS CAPSULE STUDY N/A 03/21/2016   Procedure: GIVENS CAPSULE STUDY;  Surgeon: Carol Ada, MD;  Location: [facility];  Service: Endoscopy;  Laterality: N/A;  . HAND SURGERY  1995 and 1996   artificial joints both hands  . JOINT REPLACEMENT    . LUMBAR LAMINECTOMY/DECOMPRESSION MICRODISCECTOMY N/A 02/10/2016   Procedure: MICROLUMBAR DECOMPRESSION L4-L5 AND L3- L4, AND EXCISION OF SYNOVIAL CYST L4-L5;  Surgeon: Susa Day, MD;  Location: [facility];  Service: Orthopedics;  Laterality: N/A;  . TOTAL HIP ARTHROPLASTY Left 2009  . TOTAL HIP ARTHROPLASTY Right 04/03/2013   Procedure: RIGHT TOTAL HIP ARTHROPLASTY ANTERIOR APPROACH;  Surgeon: Gearlean Alf, MD;  Location: [facility];  Service: Orthopedics;  Laterality: Right;     reports that she has never smoked. She has never used smokeless tobacco. She reports that she does not drink alcohol or use drugs.  No Known Allergies  Family History  Problem Relation Age of Onset  . Stroke Father   . Heart failure Sister   . Hypertension Sister   . Heart attack Neg Hx      Prior to Admission medications   Medication Sig Start Date End Date Taking? Authorizing Provider  buPROPion (WELLBUTRIN SR) 150 MG 12 hr tablet Take 150 mg by mouth 2 (two) times daily.      Historical Provider, MD  Cholecalciferol (VITAMIN D3) 1000 units CAPS Take 1,000 Units by mouth daily.    Historical Provider, MD  Cyanocobalamin (VITAMIN B 12 PO) Take 1,000 mcg by mouth daily.     Historical Provider, MD  diltiazem (CARDIZEM CD) 360 MG 24 hr capsule Take 1 capsule (360 mg total) by mouth daily. 05/11/16   Erlene Quan, PA-C  esomeprazole (NEXIUM) 40 MG capsule Take 40 mg by mouth daily.      Historical Provider, MD  ferrous gluconate (FERGON) 324 MG tablet Take 1 tablet (324 mg total) by mouth 2 (two) times daily with a meal.  05/17/16   Debbe Odea, MD  fluticasone (FLONASE) 50 MCG/ACT nasal spray Place 1-2 sprays into both nostrils daily as needed for allergies.  04/12/15   Historical Provider, MD  furosemide (LASIX) 20 MG tablet TAKE 2 TABLETS DAILY 04/05/16   Lelon Perla, MD  glimepiride (AMARYL) 2 MG tablet Take 2 mg by mouth every morning. 11/03/14   Historical Provider, MD  leflunomide (ARAVA) 20 MG tablet Take 20 mg by mouth daily.  04/26/13   Historical Provider, MD  metFORMIN (GLUCOPHAGE-XR) 500 MG 24 hr tablet Take 1,000 mg by mouth 2 (two) times daily. 09/02/14   Historical Provider, MD  metoprolol (LOPRESSOR) 50 MG tablet Take 2 tablets (100 mg total) by mouth 2 (two) times daily. 05/11/16   Erlene Quan, PA-C  Multiple Vitamins-Minerals (PRESERVISION AREDS) CAPS Take 1 capsule by mouth 2 (two) times daily.    Historical Provider, MD  nitroGLYCERIN (NITROSTAT) 0.4 MG SL tablet Place 0.4 mg under the tongue every 5 (five) minutes as needed for chest pain (x 3 doses).  Historical Provider, MD  Omega 3 1200 MG CAPS Take 1 capsule by mouth daily.    Historical Provider, MD  ORENCIA 125 MG/ML SOSY Inject 125 mg into the skin every 7 (seven) days. Thurs. 01/27/15   Historical Provider, MD  potassium chloride (KLOR-CON 10) 10 MEQ tablet Take 2 tablets (20 mEq total) by mouth 2 (two) times daily. 01/04/16   Lelon Perla, MD  predniSONE (DELTASONE) 5 MG tablet Take 5 mg by mouth daily.    Historical Provider, MD  rivaroxaban (XARELTO) 20 MG TABS tablet Take 20 mg by mouth daily with supper.    Historical Provider, MD  rosuvastatin (CRESTOR) 10 MG tablet Take 10 mg by mouth daily.     Historical Provider, MD    Physical Exam: Vitals:   05/20/16 2030 05/20/16 2045 05/20/16 2115 05/20/16 2130  BP: 132/67 125/95 125/73 111/74  Pulse: (!) 142 114 (!) 133 (!) 136  Resp: (!) 30 24 (!) 30 (!) 33  Temp:      TempSrc:      SpO2: 98% 97% 97% 98%      Constitutional: Respiratory distress with tachypnea between 2  words and accessory muscle recruitment. Appears drowsy.  Eyes: PERTLA, lids and conjunctivae normal ENMT: Mucous membranes are moist. Posterior pharynx clear of any exudate or lesions.   Neck: normal, supple, no masses, no thyromegaly Respiratory: Coarse crackles at bilateral bases and mid-lung zones. Increased WOB. No cyanosis.  Cardiovascular: Rate ~120 and irregular. No extremity edema. JVP 9 cm H2O. Abdomen: No distension, no tenderness, no masses palpated. Bowel sounds normal.  Musculoskeletal: no clubbing / cyanosis. No joint deformity upper and lower extremities. Normal muscle tone.  Skin: no significant rashes, lesions, ulcers. Warm, dry, well-perfused. Neurologic: CN 2-12 grossly intact. Sensation intact, DTR normal. Strength 5/5 in all 4 limbs.  Psychiatric: Normal judgment and insight. Alert and oriented x 3.      Labs on Admission: I have personally reviewed following labs and imaging studies  CBC:  Recent Labs Lab 05/16/16 1126 05/16/16 1810 05/17/16 0806 05/20/16 1937  WBC 9.4 9.0  --  9.7  NEUTROABS  --   --   --  8.4*  HGB 7.2* 7.4* 7.7* 8.5*  HCT 26.1* 26.6* 26.4* 29.2*  MCV 71.3* 69.6*  --  74.9*  PLT 311 316  --  [id number]   Basic Metabolic Panel:  Recent Labs Lab 05/16/16 1810 05/17/16 0806 05/20/16 1937  NA 141 139 135  K 4.2 4.0 3.7  CL 106 105 104  CO2 24 24 19*  GLUCOSE 104* 129* 120*  BUN 14 12 11   CREATININE 0.67 0.57 0.64  CALCIUM 8.9 8.7* 8.6*   GFR: Estimated Creatinine Clearance: 70.6 mL/min (by C-G formula based on SCr of 0.64 mg/dL). Liver Function Tests:  Recent Labs Lab 05/20/16 1937  AST 33  ALT 13*  ALKPHOS 115  BILITOT 0.9  PROT 6.2*  ALBUMIN 3.1*   No results for input(s): LIPASE, AMYLASE in the last 168 hours. No results for input(s): AMMONIA in the last 168 hours. Coagulation Profile: No results for input(s): INR, PROTIME in the last 168 hours. Cardiac Enzymes: No results for input(s): CKTOTAL, CKMB, CKMBINDEX,  TROPONINI in the last 168 hours. BNP (last 3 results) No results for input(s): PROBNP in the last 8760 hours. HbA1C: No results for input(s): HGBA1C in the last 72 hours. CBG:  Recent Labs Lab 05/17/16 0057 05/17/16 0430 05/17/16 0456 05/17/16 0727 05/20/16 1941  GLUCAP 77 55* 85 84  125*   Lipid Profile: No results for input(s): CHOL, HDL, LDLCALC, TRIG, CHOLHDL, LDLDIRECT in the last 72 hours. Thyroid Function Tests: No results for input(s): TSH, T4TOTAL, FREET4, T3FREE, THYROIDAB in the last 72 hours. Anemia Panel: No results for input(s): VITAMINB12, FOLATE, FERRITIN, TIBC, IRON, RETICCTPCT in the last 72 hours. Urine analysis:    Component Value Date/Time   COLORURINE YELLOW 10/09/2014 0040   APPEARANCEUR CLEAR 10/09/2014 0040   LABSPEC 1.030 10/09/2014 0040   PHURINE 6.0 10/09/2014 0040   GLUCOSEU 100 (A) 10/09/2014 0040   HGBUR NEGATIVE 10/09/2014 0040   BILIRUBINUR NEGATIVE 10/09/2014 0040   KETONESUR NEGATIVE 10/09/2014 0040   PROTEINUR 30 (A) 10/09/2014 0040   UROBILINOGEN 1.0 10/09/2014 0040   NITRITE NEGATIVE 10/09/2014 0040   LEUKOCYTESUR NEGATIVE 10/09/2014 0040   Sepsis Labs: @LABRCNTIP (procalcitonin:4,lacticidven:4) )No results found for this or any previous visit (from the past 240 hour(s)).   Radiological Exams on Admission: Dg Chest Portable 1 View  Result Date: 05/20/2016 CLINICAL DATA:  Acute onset of shortness of breath. Initial encounter. EXAM: PORTABLE CHEST 1 VIEW COMPARISON:  Chest radiograph performed 05/16/2016 FINDINGS: The lungs are well-aerated. Vascular congestion is noted. Bibasilar airspace opacities may reflect mild interstitial edema. No pleural effusion or pneumothorax is seen. The cardiomediastinal silhouette is mildly enlarged. No acute osseous abnormalities are seen. IMPRESSION: Vascular congestion and mild cardiomegaly. Bibasilar airspace opacities may reflect mild interstitial edema. Electronically Signed   By: Garald Balding M.D.    On: 05/20/2016 19:59    EKG: Independently reviewed. Atrial fibrillation with RVR (rate 135)  Assessment/Plan  1. Sepsis, influenza A - Pt presented with fevers, tachypnea, hypoxia, tachycardia, and elevated lactic acid  - Blood and urine cultures obtained in ED and empiric vancomycin and Zosyn initiated  - CXR with bibasilar opacities felt to represent edema rather than infection - UA not suggestive of infection  - Influenza PCR returned positive for influenza A and she was started on Tamiflu  - She is critically-ill and will be continued on empiric abx for now while following cultures and clinical course   2. Atrial fibrillation with RVR  - Was noted to be in a fib RVR by EMS and she was treated with 10 mg diltiazem IVP x2 en route  - Rate 140 on presentation, remained 130's despite diltiazem infusion titrated to 15 mg/hr; rate normalized after digoxin 0.25 IV x1  - Pt with hx of atrial fibrillation on Xarelto, metoprolol, and diltiazem at home  - CHADS-VASc is 45 (age, gender, CHF, DM, HTN)  - Continue diltiazem infusion, titrate off as able with conversion back to her oral diltiazem and metoprolol   3. Acute on chronic diastolic CHF   - Pt presents with new supplemental oxygen requirement, elevated JVP, and edema on CXR  - Acute pulmonary edema likely secondary to rapid rate, addressed as above  - TTE (04/06/16) with EF 55-60%, mild concentric hypertrophy, mild MR, and mild LAE; diastolic function could not be determined  - She is managed at home with Lasix 20 qD and metoprolol - Lasix held on admission given soft BP's  - SLIV, follow daily wts and I/Os, fluid-restrict diet  - BiPAP prn; diurese as BP allows    4. Iron-deficiency anemia - Hgb 8.5 on admission with MCV 74.9  - She was recently hospitalized with symptomatic anemia and was transfused 1 unit; H/H remain stable and there were serial negative FOBT's  - Monitor periodic CBC    5. Rheumatoid arthritis - Stable  on  admission  - Managed with leflunomide and prednisone 5 mg qD at home; these are held on admission with concern for infection   6. Type II DM  - A1c 6.0% in November 2017  - Managed at home with glimepiride and metformin; these are held on admission  - Check CBG with meals and qHS  - Start a low-intensity sliding-scale correctional Novolg   7. Hypertension - BP has remained soft since presentation - She takes diltiazem, metoprolol, and Lasix at home; these are held on admission due to soft BP    8. Depression, anxiety  - Stable at time of admission  - Continue Wellbutrin     DVT prophylaxis: Xarelto  Code Status: Full  Family Communication: Discussed with patient Disposition Plan: Admit to stepdown unit Consults called: None Admission status: Inpatient    Vianne Bulls, MD [facility] Pager [phone]  If 7PM-7AM, please contact night-coverage [email or web address] Password [facility]  05/20/2016, 9:52 PM

## 2016-05-20 NOTE — ED Notes (Signed)
Called pharmacy for diltiazem 

## 2016-05-20 NOTE — ED Provider Notes (Signed)
[facility] DEPT Provider Note   CSN: [id number] Arrival date & time: 05/20/16  1929    History   Chief Complaint Chief Complaint  Patient presents with  . Shortness of Breath    HPI [name] is a 71 y.o. female.  The history is provided by the patient.  Shortness of Breath  This is a recurrent problem. The average episode lasts 1 day. The problem occurs continuously.The current episode started 3 to 5 hours ago. The problem has been gradually worsening. Associated symptoms include a fever, cough and sputum production. Pertinent negatives include no sore throat, no ear pain, no chest pain, no vomiting, no abdominal pain and no rash. Treatments tried: CPAP with EMS. The treatment provided no relief. She has had prior hospitalizations. She has had prior ED visits. Associated medical issues include DVT. Associated medical issues do not include COPD, PE, CAD, heart failure or past MI.    Past Medical History:  Diagnosis Date  . Actinic keratosis   . Anemia    hx of  . Anemia of chronic renal failure, stage 3 (moderate) 07/17/2015  . Anticoagulant causing adverse effect in therapeutic use 07/17/2015  . Anxiety   . Atrial fibrillation ([place])   . Atrial fibrillation ([place])   . Carpal tunnel syndrome   . Chest pain   . Depression   . Edema   . Gastroesophageal reflux disease   . Hepatitis 1970  . Hiatal hernia   . Hyperlipidemia   . Hypertension   . Iron deficiency anemia due to chronic blood loss 07/17/2015  . Jaundice    age 71  . Memory loss   . Peripheral vascular disease (HCC)    DVT left lower leg  . Rheumatoid arthritis(714.0)   . Thyroid disease    multi nodular goiter  . Type II diabetes mellitus [facility])     Patient Active Problem List   Diagnosis Date Noted  . Influenza A 05/21/2016  . Sepsis ([place]) 05/20/2016  . Atrial fibrillation with RVR ([place]) 05/20/2016  . Acute CHF (congestive heart failure) ([place]) 05/20/2016  . History of GI bleed 03/29/2016  .  Chronic anticoagulation 03/29/2016  . Symptomatic anemia 03/19/2016  . Shortness of breath   . Spinal stenosis of lumbar region 02/10/2016  . Swelling of limb 01/13/2016  . Varicose veins of left leg with edema 09/15/2015  . Preop cardiovascular exam 08/25/2015  . Iron deficiency anemia due to chronic blood loss 07/17/2015  . Anemia of chronic renal failure, stage 3 (moderate) 07/17/2015  . Anticoagulant causing adverse effect in therapeutic use 07/17/2015  . Symptomatic bradycardia 02/21/2015  . Bradycardia   . Acute deep vein thrombosis (DVT) of left lower extremity ([place]) 05/06/2014  . Abdominal wall mass of right lower quadrant 07/08/2013  . OA (osteoarthritis) of hip 04/03/2013  . Osteoarthritis of hip 01/18/2013  . Night sweats 09/06/2012  . Chest pain   . Permanent atrial fibrillation ([place])   . Anxiety and depression   . Gastroesophageal reflux disease   . Hepatitis   . Jaundice   . Hypertension 07/01/2010  . Hypercholesterolemia 07/01/2010  . DM2 (diabetes mellitus, type 2) ([place]) 07/01/2010  . Rheumatoid arthritis ([place]) 07/01/2010  . Sinus tachycardia 07/01/2010  . Chest pain, atypical 07/01/2010    Past Surgical History:  Procedure Laterality Date  . CATARACT EXTRACTION Left   . COLONOSCOPY WITH PROPOFOL N/A 02/20/2015   Procedure: COLONOSCOPY WITH PROPOFOL;  Surgeon: Carol Ada, MD;  Location: [facility] ENDOSCOPY;  Service: Endoscopy;  Laterality: N/A;  . ENTEROSCOPY N/A 03/23/2016   Procedure: ENTEROSCOPY;  Surgeon: Carol Ada, MD;  Location: [facility] ENDOSCOPY;  Service: Endoscopy;  Laterality: N/A;  . ESOPHAGOGASTRODUODENOSCOPY (EGD) WITH PROPOFOL N/A 02/20/2015   Procedure: ESOPHAGOGASTRODUODENOSCOPY (EGD) WITH PROPOFOL;  Surgeon: Carol Ada, MD;  Location: [facility] ENDOS[facility];  Service: Endoscopy;  Laterality: N/A;  . GIVENS CAPSULE STUDY N/A 03/21/2016   Procedure: GIVENS CAPSULE STUDY;  Surgeon: Carol Ada, MD;  Location: [facility] ENDOSCOPY;  Service: Endoscopy;  Laterality:  N/A;  . HAND SURGERY  1995 and 1996   artificial joints both hands  . JOINT REPLACEMENT    . LUMBAR LAMINECTOMY/DECOMPRESSION MICRODISCECTOMY N/A 02/10/2016   Procedure: MICROLUMBAR DECOMPRESSION L4-L5 AND L3- L4, AND EXCISION OF SYNOVIAL CYST L4-L5;  Surgeon: Susa Day, MD;  Location: [facility] ORS;  Service: Orthopedics;  Laterality: N/A;  . TOTAL HIP ARTHROPLASTY Left 2009  . TOTAL HIP ARTHROPLASTY Right 04/03/2013   Procedure: RIGHT TOTAL HIP ARTHROPLASTY ANTERIOR APPROACH;  Surgeon: Gearlean Alf, MD;  Location: [facility] ORS;  Service: Orthopedics;  Laterality: Right;    OB History    No data available       Home Medications    Prior to Admission medications   Medication Sig Start Date End Date Taking? Authorizing Provider  Abatacept (ORENCIA CLICKJECT) 0000000 MG/ML SOAJ Inject 125 mg into the skin every Thursday.    Yes Historical Provider, MD  buPROPion (WELLBUTRIN SR) 150 MG 12 hr tablet Take 150 mg by mouth 2 (two) times daily.     Yes Historical Provider, MD  diltiazem (CARDIZEM CD) 360 MG 24 hr capsule Take 1 capsule (360 mg total) by mouth daily. 05/11/16  Yes Luke K Kilroy, PA-C  esomeprazole (NEXIUM) 40 MG capsule Take 40 mg by mouth daily.     Yes Historical Provider, MD  furosemide (LASIX) 20 MG tablet TAKE 2 TABLETS DAILY 04/05/16  Yes Lelon Perla, MD  glimepiride (AMARYL) 2 MG tablet Take 2 mg by mouth daily with breakfast.  11/03/14  Yes Historical Provider, MD  leflunomide (ARAVA) 20 MG tablet Take 20 mg by mouth daily.  04/26/13  Yes Historical Provider, MD  metFORMIN (GLUCOPHAGE-XR) 500 MG 24 hr tablet Take 1,000 mg by mouth 2 (two) times daily. 09/02/14  Yes Historical Provider, MD  metoprolol (LOPRESSOR) 50 MG tablet Take 2 tablets (100 mg total) by mouth 2 (two) times daily. 05/11/16  Yes Luke K Kilroy, PA-C  potassium chloride (KLOR-CON 10) 10 MEQ tablet Take 2 tablets (20 mEq total) by mouth 2 (two) times daily. 01/04/16  Yes Lelon Perla, MD  predniSONE (DELTASONE) 5 MG  tablet Take 5 mg by mouth daily.   Yes Historical Provider, MD  rivaroxaban (XARELTO) 20 MG TABS tablet Take 20 mg by mouth daily with supper.   Yes Historical Provider, MD  rosuvastatin (CRESTOR) 10 MG tablet Take 10 mg by mouth daily.    Yes Historical Provider, MD  Cholecalciferol (VITAMIN D3) 1000 units CAPS Take 1,000 Units by mouth daily.    Historical Provider, MD  Cyanocobalamin (VITAMIN B 12 PO) Take 1,000 mcg by mouth daily.     Historical Provider, MD  ferrous gluconate (FERGON) 324 MG tablet Take 1 tablet (324 mg total) by mouth 2 (two) times daily with a meal. 05/17/16   Debbe Odea, MD  fluticasone (FLONASE) 50 MCG/ACT nasal spray Place 1-2 sprays into both nostrils daily as needed for allergies.  04/12/15   Historical Provider, MD  Multiple Vitamins-Minerals (PRESERVISION AREDS) CAPS Take  1 capsule by mouth 2 (two) times daily.    Historical Provider, MD  nitroGLYCERIN (NITROSTAT) 0.4 MG SL tablet Place 0.4 mg under the tongue every 5 (five) minutes as needed for chest pain (x 3 doses).     Historical Provider, MD  Omega 3 1200 MG CAPS Take 1 capsule by mouth daily.    Historical Provider, MD    Family History Family History  Problem Relation Age of Onset  . Stroke Father   . Heart failure Sister   . Hypertension Sister   . Heart attack Neg Hx     Social History Social History  Substance Use Topics  . Smoking status: Never Smoker  . Smokeless tobacco: Never Used  . Alcohol use No     Allergies   Patient has no known allergies.   Review of Systems Review of Systems  Constitutional: Positive for fatigue and fever. Negative for chills.  HENT: Negative for ear pain and sore throat.   Eyes: Negative for pain and visual disturbance.  Respiratory: Positive for cough, sputum production and shortness of breath.   Cardiovascular: Negative for chest pain and palpitations.  Gastrointestinal: Negative for abdominal pain and vomiting.  Genitourinary: Negative for dysuria and  hematuria.  Musculoskeletal: Negative for arthralgias and back pain.  Skin: Negative for color change and rash.  Neurological: Positive for weakness. Negative for seizures and syncope.  All other systems reviewed and are negative.    Physical Exam Updated Vital Signs BP 132/65 (BP Location: Left Arm)   Pulse (!) 131   Temp 99.5 F (37.5 C) (Oral)   Resp (!) 32   Ht 5\' 7"  (1.702 m)   Wt 78.7 kg   SpO2 100%   BMI 27.17 kg/m   Physical Exam  Constitutional: She appears well-developed and well-nourished. She appears distressed.  HENT:  Head: Normocephalic and atraumatic.  Eyes: Conjunctivae are normal.  Neck: Neck supple.  Cardiovascular:  No murmur heard. Tachycardic, irregular rhythm  Pulmonary/Chest: She is in respiratory distress. She has wheezes. She has rales.  Tachypnea with increased WOB  Abdominal: Soft. There is no tenderness.  Musculoskeletal: Normal range of motion. She exhibits edema.  Swelling and tenderness to LLE  Neurological: She is alert.  Skin: Skin is warm and dry. No erythema.  Psychiatric: She has a normal mood and affect.  Nursing note and vitals reviewed.    ED Treatments / Results  Labs (all labs ordered are listed, but only abnormal results are displayed) Labs Reviewed  CBC WITH DIFFERENTIAL/PLATELET - Abnormal; Notable for the following:       Result Value   Hemoglobin 8.5 (*)    HCT 29.2 (*)    MCV 74.9 (*)    MCH 21.8 (*)    MCHC 29.1 (*)    RDW 24.9 (*)    Neutro Abs 8.4 (*)    Lymphs Abs 0.6 (*)    All other components within normal limits  COMPREHENSIVE METABOLIC PANEL - Abnormal; Notable for the following:    CO2 19 (*)    Glucose, Bld 120 (*)    Calcium 8.6 (*)    Total Protein 6.2 (*)    Albumin 3.1 (*)    ALT 13 (*)    All other components within normal limits  BRAIN NATRIURETIC PEPTIDE - Abnormal; Notable for the following:    B Natriuretic Peptide 349.7 (*)    All other components within normal limits  URINALYSIS,  ROUTINE W REFLEX MICROSCOPIC - Abnormal; Notable for  the following:    Protein, ur 30 (*)    Bacteria, UA RARE (*)    Squamous Epithelial / LPF 0-5 (*)    All other components within normal limits  INFLUENZA PANEL BY PCR (TYPE A & B) - Abnormal; Notable for the following:    Influenza A By PCR POSITIVE (*)    All other components within normal limits  CBC WITH DIFFERENTIAL/PLATELET - Abnormal; Notable for the following:    RBC 3.81 (*)    Hemoglobin 8.1 (*)    HCT 28.6 (*)    MCV 75.1 (*)    MCH 21.3 (*)    MCHC 28.3 (*)    RDW 25.1 (*)    All other components within normal limits  TROPONIN I - Abnormal; Notable for the following:    Troponin I 0.03 (*)    All other components within normal limits  TROPONIN I - Abnormal; Notable for the following:    Troponin I 0.03 (*)    All other components within normal limits  TROPONIN I - Abnormal; Notable for the following:    Troponin I 0.03 (*)    All other components within normal limits  COMPREHENSIVE METABOLIC PANEL - Abnormal; Notable for the following:    Potassium 3.1 (*)    Glucose, Bld 54 (*)    Calcium 8.4 (*)    Total Protein 5.8 (*)    Albumin 2.7 (*)    ALT 11 (*)    All other components within normal limits  T4, FREE - Abnormal; Notable for the following:    Free T4 1.30 (*)    All other components within normal limits  GLUCOSE, CAPILLARY - Abnormal; Notable for the following:    Glucose-Capillary 32 (*)    All other components within normal limits  GLUCOSE, CAPILLARY - Abnormal; Notable for the following:    Glucose-Capillary 112 (*)    All other components within normal limits  GLUCOSE, CAPILLARY - Abnormal; Notable for the following:    Glucose-Capillary 27 (*)    All other components within normal limits  GLUCOSE, CAPILLARY - Abnormal; Notable for the following:    Glucose-Capillary 106 (*)    All other components within normal limits  GLUCOSE, CAPILLARY - Abnormal; Notable for the following:     Glucose-Capillary 46 (*)    All other components within normal limits  GLUCOSE, CAPILLARY - Abnormal; Notable for the following:    Glucose-Capillary 126 (*)    All other components within normal limits  I-STAT CG4 LACTIC ACID, ED - Abnormal; Notable for the following:    Lactic Acid, Venous 1.99 (*)    All other components within normal limits  I-STAT VENOUS BLOOD GAS, ED - Abnormal; Notable for the following:    pH, Ven 7.482 (*)    pCO2, Ven 29.9 (*)    pO2, Ven 72.0 (*)    All other components within normal limits  CBG MONITORING, ED - Abnormal; Notable for the following:    Glucose-Capillary 125 (*)    All other components within normal limits  CULTURE, BLOOD (ROUTINE X 2)  CULTURE, BLOOD (ROUTINE X 2)  MRSA PCR SCREENING  URINE CULTURE  CULTURE, EXPECTORATED SPUTUM-ASSESSMENT  LACTIC ACID, PLASMA  LACTIC ACID, PLASMA  PROCALCITONIN  TSH  GLUCOSE, CAPILLARY  I-STAT TROPOININ, ED  TYPE AND SCREEN  ABO/RH    EKG  EKG Interpretation  Date/Time:  Friday May 20 2016 19:34:01 EST Ventricular Rate:  135 PR Interval:    QRS  Duration: 85 QT Interval:  317 QTC Calculation: 465 R Axis:   81 Text Interpretation:  Sinus or ectopic atrial tachycardia Multiple premature complexes, vent & supraven Borderline right axis deviation Low voltage, precordial leads Borderline repolarization abnormality afib with RVR  Confirmed by LIU MD, DANA [phone]) on 05/20/2016 8:04:15 PM       Radiology Dg Chest Port 1 View  Result Date: 05/21/2016 CLINICAL DATA:  Shortness of breath and cough EXAM: PORTABLE CHEST 1 VIEW COMPARISON:  May 20, 2016 and May 16, 2016 FINDINGS: There remains mild cardiac enlargement with mild pulmonary venous hypertension. No frank edema or consolidation. Interstitium is prominent ; there may be slight interstitial edema. No evident adenopathy. No bone lesions. IMPRESSION: Pulmonary vascular congestion with questionable mild interstitial edema. Essentially  stable study compared to 1 day prior. No consolidation. Electronically Signed   By: Lowella Grip III M.D.   On: 05/21/2016 10:38   Dg Chest Portable 1 View  Result Date: 05/20/2016 CLINICAL DATA:  Acute onset of shortness of breath. Initial encounter. EXAM: PORTABLE CHEST 1 VIEW COMPARISON:  Chest radiograph performed 05/16/2016 FINDINGS: The lungs are well-aerated. Vascular congestion is noted. Bibasilar airspace opacities may reflect mild interstitial edema. No pleural effusion or pneumothorax is seen. The cardiomediastinal silhouette is mildly enlarged. No acute osseous abnormalities are seen. IMPRESSION: Vascular congestion and mild cardiomegaly. Bibasilar airspace opacities may reflect mild interstitial edema. Electronically Signed   By: Garald Balding M.D.   On: 05/20/2016 19:59    Procedures Procedures (including critical care time)  Medications Ordered in ED Medications  diltiazem (CARDIZEM) 100 mg in dextrose 5 % 100 mL (1 mg/mL) infusion (15 mg/hr Intravenous New Bag/Given 05/21/16 1013)  vancomycin (VANCOCIN) IVPB 1000 mg/200 mL premix (1,000 mg Intravenous Given 05/21/16 0852)  piperacillin-tazobactam (ZOSYN) IVPB 3.375 g (3.375 g Intravenous Given 05/21/16 1107)  ferrous gluconate (FERGON) tablet 324 mg (324 mg Oral Given 05/21/16 0816)  omega-3 acid ethyl esters (LOVAZA) capsule 1,000 mg (1,000 mg Oral Given 05/21/16 1007)  rivaroxaban (XARELTO) tablet 20 mg (not administered)  cholecalciferol (VITAMIN D) tablet 1,000 Units (1,000 Units Oral Given 05/21/16 1000)  rosuvastatin (CRESTOR) tablet 10 mg (10 mg Oral Given 05/21/16 1006)  potassium chloride (K-DUR,KLOR-CON) CR tablet 20 mEq (20 mEq Oral Given 05/21/16 1006)  fluticasone (FLONASE) 50 MCG/ACT nasal spray 1-2 spray (not administered)  buPROPion (WELLBUTRIN SR) 12 hr tablet 150 mg (150 mg Oral Given 05/21/16 0845)  pantoprazole (PROTONIX) EC tablet 40 mg (40 mg Oral Given 05/21/16 1006)  sodium chloride flush (NS) 0.9 %  injection 3 mL (3 mLs Intravenous Not Given 05/21/16 1000)  sodium chloride flush (NS) 0.9 % injection 3 mL (3 mLs Intravenous Given 05/21/16 1008)  sodium chloride flush (NS) 0.9 % injection 3 mL (not administered)  0.9 %  sodium chloride infusion (not administered)  acetaminophen (TYLENOL) tablet 650 mg (650 mg Oral Given 05/20/16 2356)    Or  acetaminophen (TYLENOL) suppository 650 mg ( Rectal See Alternative 05/20/16 2356)  HYDROcodone-acetaminophen (NORCO/VICODIN) 5-325 MG per tablet 1-2 tablet (1 tablet Oral Given 05/21/16 1006)  polyethylene glycol (MIRALAX / GLYCOLAX) packet 17 g (not administered)  bisacodyl (DULCOLAX) EC tablet 5 mg (not administered)  ondansetron (ZOFRAN) tablet 4 mg (not administered)    Or  ondansetron (ZOFRAN) injection 4 mg (not administered)  insulin aspart (novoLOG) injection 0-9 Units (0 Units Subcutaneous Not Given 05/21/16 0800)  insulin aspart (novoLOG) injection 0-5 Units (0 Units Subcutaneous Not Given 05/20/16 2312)  morphine 4  MG/ML injection 1-2 mg (not administered)  LORazepam (ATIVAN) injection 0.5 mg (0.5 mg Intravenous Given 05/21/16 1107)  MEDLINE mouth rinse (15 mLs Mouth Rinse Given 05/21/16 0816)  oseltamivir (TAMIFLU) capsule 75 mg (75 mg Oral Given 05/21/16 1007)  levalbuterol (XOPENEX) nebulizer solution 0.63 mg (0.63 mg Nebulization Given 05/21/16 1005)  benzonatate (TESSALON) capsule 200 mg (200 mg Oral Given 05/21/16 1006)  guaiFENesin (MUCINEX) 12 hr tablet 600 mg (600 mg Oral Given 05/21/16 1007)  methylPREDNISolone sodium succinate (SOLU-MEDROL) 40 mg/mL injection 40 mg (40 mg Intravenous Given 05/21/16 1107)  diltiazem (CARDIZEM) 1 mg/mL load via infusion 15 mg (15 mg Intravenous Bolus from Bag 05/20/16 2008)  ondansetron (ZOFRAN) injection 4 mg ( Intravenous Duplicate [id number])  piperacillin-tazobactam (ZOSYN) IVPB 3.375 g (0 g Intravenous Stopped 05/20/16 2145)  vancomycin (VANCOCIN) IVPB 1000 mg/200 mL premix (0 mg Intravenous Stopped  05/20/16 2210)  digoxin (LANOXIN) 0.25 MG/ML injection 0.25 mg (0.25 mg Intravenous Given 05/20/16 2302)  dextrose 50 % solution 50 mL (50 mLs Intravenous Given 05/20/16 2302)  dextrose 50 % solution (50 mLs  Given 05/21/16 0816)     Initial Impression / Assessment and Plan / ED Course  I have reviewed the triage vital signs and the nursing notes.  Pertinent labs & imaging results that were available during my care of the patient were reviewed by me and considered in my medical decision making (see chart for details).    Pt is a 71 yo female with hx as above who presents with 1 day of progressive SOB. She was found to be in a-fib with RVR with EMS and rec'd two push doses of Cardizem and started on infusion. She was also placed on CPAP d/t tachypnea, crackles, and increased WOB. Infusion at 5 mg/hr on arrival and HR in the 130s. Pt denies any chest pain. Pt transitioned to BiPAP after being transferred to our bed. Dilt gtt continued and titrated to achieve better HR control. She is febrile. Sepsis workup initiated. She has had two recent hospitalizations so we covered her with Vanc/Zosyn. She was able to be weaned off BiPAP. She has hx of DVT in LLE and was recently off Xarelto briefly in the setting of GI bleed but has since restarted. I feel her SOB is related to underlying infection at this time rather than Pe. Her Hgb is 8.5 and stable. Pt admitted to hospitalist for further workup and treatment of a-fib with RVR and sepsis.  Final Clinical Impressions(s) / ED Diagnoses   Final diagnoses:  SOB (shortness of breath)    New Prescriptions Current Discharge Medication List       Clifton James, MD 05/21/16 [place] Liu, MD 05/21/16 [phone]

## 2016-05-21 ENCOUNTER — Inpatient Hospital Stay (HOSPITAL_COMMUNITY): Payer: Medicare Other

## 2016-05-21 ENCOUNTER — Encounter (HOSPITAL_COMMUNITY): Payer: Self-pay | Admitting: Family Medicine

## 2016-05-21 DIAGNOSIS — J101 Influenza due to other identified influenza virus with other respiratory manifestations: Secondary | ICD-10-CM | POA: Diagnosis present

## 2016-05-21 LAB — COMPREHENSIVE METABOLIC PANEL
ALBUMIN: 2.7 g/dL — AB (ref 3.5–5.0)
ALT: 11 U/L — ABNORMAL LOW (ref 14–54)
ANION GAP: 12 (ref 5–15)
AST: 26 U/L (ref 15–41)
Alkaline Phosphatase: 103 U/L (ref 38–126)
BILIRUBIN TOTAL: 1.1 mg/dL (ref 0.3–1.2)
BUN: 10 mg/dL (ref 6–20)
CHLORIDE: 103 mmol/L (ref 101–111)
CO2: 22 mmol/L (ref 22–32)
Calcium: 8.4 mg/dL — ABNORMAL LOW (ref 8.9–10.3)
Creatinine, Ser: 0.66 mg/dL (ref 0.44–1.00)
GFR calc Af Amer: >60 mL/min (ref >60)
GFR calc non Af Amer: >60 mL/min (ref >60)
GLUCOSE: 54 mg/dL — AB (ref 65–99)
POTASSIUM: 3.1 mmol/L — AB (ref 3.5–5.1)
SODIUM: 137 mmol/L (ref 135–145)
TOTAL PROTEIN: 5.8 g/dL — AB (ref 6.5–8.1)

## 2016-05-21 LAB — INFLUENZA PANEL BY PCR (TYPE A & B)
INFLAPCR: POSITIVE — AB
Influenza B By PCR: NEGATIVE

## 2016-05-21 LAB — BLOOD GAS, ARTERIAL
ACID-BASE DEFICIT: 1.1 mmol/L (ref 0.0–2.0)
BICARBONATE: 22.9 mmol/L (ref 20.0–28.0)
Drawn by: 235881
O2 CONTENT: 2 L/min
O2 Saturation: 96.5 %
PCO2 ART: 37.9 mmHg (ref 32.0–48.0)
PH ART: 7.402 (ref 7.350–7.450)
Patient temperature: 99.8
pO2, Arterial: 91.3 mmHg (ref 83.0–108.0)

## 2016-05-21 LAB — GLUCOSE, CAPILLARY
GLUCOSE-CAPILLARY: 86 mg/dL (ref 65–99)
GLUCOSE-CAPILLARY: 126 mg/dL — AB (ref 65–99)
GLUCOSE-CAPILLARY: 203 mg/dL — AB (ref 65–99)
Glucose-Capillary: 106 mg/dL — ABNORMAL HIGH (ref 65–99)
Glucose-Capillary: 27 mg/dL — CL (ref 65–99)
Glucose-Capillary: 46 mg/dL — ABNORMAL LOW (ref 65–99)
Glucose-Capillary: 390 mg/dL — ABNORMAL HIGH (ref 65–99)

## 2016-05-21 LAB — TROPONIN I
TROPONIN I: 0.03 ng/mL — AB (ref <0.03)
TROPONIN I: 0.03 ng/mL — AB (ref <0.03)

## 2016-05-21 LAB — URINALYSIS, ROUTINE W REFLEX MICROSCOPIC
Bilirubin Urine: NEGATIVE
GLUCOSE, UA: NEGATIVE mg/dL
Hgb urine dipstick: NEGATIVE
Ketones, ur: NEGATIVE mg/dL
Leukocytes, UA: NEGATIVE
Nitrite: NEGATIVE
PROTEIN: 30 mg/dL — AB
SPECIFIC GRAVITY, URINE: 1.021 (ref 1.005–1.030)
pH: 6 (ref 5.0–8.0)

## 2016-05-21 LAB — CBC WITH DIFFERENTIAL/PLATELET
BASOS ABS: 0 10*3/uL (ref 0.0–0.1)
Basophils Relative: 0 %
EOS ABS: 0.1 10*3/uL (ref 0.0–0.7)
Eosinophils Relative: 1 %
HCT: 28.6 % — ABNORMAL LOW (ref 36.0–46.0)
Hemoglobin: 8.1 g/dL — ABNORMAL LOW (ref 12.0–15.0)
LYMPHS PCT: 8 %
Lymphs Abs: 0.7 10*3/uL (ref 0.7–4.0)
MCH: 21.3 pg — AB (ref 26.0–34.0)
MCHC: 28.3 g/dL — ABNORMAL LOW (ref 30.0–36.0)
MCV: 75.1 fL — ABNORMAL LOW (ref 78.0–100.0)
MONO ABS: 0.7 10*3/uL (ref 0.1–1.0)
Monocytes Relative: 8 %
NEUTROS PCT: 83 %
Neutro Abs: 6.8 10*3/uL (ref 1.7–7.7)
PLATELETS: 221 10*3/uL (ref 150–400)
RBC: 3.81 MIL/uL — AB (ref 3.87–5.11)
RDW: 25.1 % — ABNORMAL HIGH (ref 11.5–15.5)
WBC: 8.3 10*3/uL (ref 4.0–10.5)

## 2016-05-21 LAB — ABO/RH: ABO/RH(D): O NEG

## 2016-05-21 LAB — LACTIC ACID, PLASMA: LACTIC ACID, VENOUS: 1.3 mmol/L (ref 0.5–1.9)

## 2016-05-21 LAB — MRSA PCR SCREENING: MRSA by PCR: NEGATIVE

## 2016-05-21 LAB — T4, FREE: Free T4: 1.3 ng/dL — ABNORMAL HIGH (ref 0.61–1.12)

## 2016-05-21 LAB — PROCALCITONIN: Procalcitonin: 0.27 ng/mL

## 2016-05-21 MED ORDER — LEVALBUTEROL HCL 0.63 MG/3ML IN NEBU
0.6300 mg | INHALATION_SOLUTION | Freq: Four times a day (QID) | RESPIRATORY_TRACT | Status: DC
  Administered 2016-05-21: 0.63 mg via RESPIRATORY_TRACT
  Filled 2016-05-21 (×2): qty 3

## 2016-05-21 MED ORDER — AMIODARONE HCL IN DEXTROSE 360-4.14 MG/200ML-% IV SOLN
30.0000 mg/h | INTRAVENOUS | Status: DC
Start: 2016-05-21 — End: 2016-05-24
  Administered 2016-05-22 – 2016-05-24 (×5): 30 mg/h via INTRAVENOUS
  Filled 2016-05-21 (×5): qty 200

## 2016-05-21 MED ORDER — OSELTAMIVIR PHOSPHATE 75 MG PO CAPS
75.0000 mg | ORAL_CAPSULE | Freq: Two times a day (BID) | ORAL | Status: AC
  Administered 2016-05-21 – 2016-05-25 (×10): 75 mg via ORAL
  Filled 2016-05-21 (×10): qty 1

## 2016-05-21 MED ORDER — LEVALBUTEROL HCL 0.63 MG/3ML IN NEBU: 0.6300 mg | INHALATION_SOLUTION | RESPIRATORY_TRACT | Status: DC | PRN

## 2016-05-21 MED ORDER — AMIODARONE HCL IN DEXTROSE 360-4.14 MG/200ML-% IV SOLN
60.0000 mg/h | INTRAVENOUS | Status: DC
  Administered 2016-05-21 (×3): 60 mg/h via INTRAVENOUS
  Filled 2016-05-21 (×3): qty 200

## 2016-05-21 MED ORDER — IPRATROPIUM BROMIDE 0.02 % IN SOLN
0.5000 mg | Freq: Four times a day (QID) | RESPIRATORY_TRACT | Status: DC
  Administered 2016-05-21 – 2016-05-22 (×3): 0.5 mg via RESPIRATORY_TRACT
  Filled 2016-05-21 (×3): qty 2.5

## 2016-05-21 MED ORDER — METHYLPREDNISOLONE SODIUM SUCC 40 MG IJ SOLR
40.0000 mg | Freq: Every day | INTRAMUSCULAR | Status: DC
  Administered 2016-05-21: 40 mg via INTRAVENOUS
  Filled 2016-05-21: qty 1

## 2016-05-21 MED ORDER — DIGOXIN 0.25 MG/ML IJ SOLN
0.2500 mg | Freq: Once | INTRAMUSCULAR | Status: AC
  Administered 2016-05-21: 0.25 mg via INTRAVENOUS
  Filled 2016-05-21: qty 2

## 2016-05-21 MED ORDER — AMIODARONE LOAD VIA INFUSION
150.0000 mg | Freq: Once | INTRAVENOUS | Status: AC
  Administered 2016-05-21: 150 mg via INTRAVENOUS

## 2016-05-21 MED ORDER — DIGOXIN 0.25 MG/ML IJ SOLN
0.1250 mg | Freq: Four times a day (QID) | INTRAMUSCULAR | Status: AC
  Administered 2016-05-22 (×2): 0.125 mg via INTRAVENOUS
  Filled 2016-05-21 (×2): qty 2

## 2016-05-21 MED ORDER — METOPROLOL TARTRATE 5 MG/5ML IV SOLN
10.0000 mg | INTRAVENOUS | Status: DC | PRN
  Administered 2016-05-21 – 2016-05-22 (×3): 10 mg via INTRAVENOUS
  Filled 2016-05-21 (×3): qty 10

## 2016-05-21 MED ORDER — SODIUM CHLORIDE 0.9% FLUSH
10.0000 mL | INTRAVENOUS | Status: DC | PRN
  Administered 2016-05-25: 10 mL
  Filled 2016-05-21: qty 40

## 2016-05-21 MED ORDER — GUAIFENESIN ER 600 MG PO TB12
600.0000 mg | ORAL_TABLET | Freq: Two times a day (BID) | ORAL | Status: DC
  Administered 2016-05-21 – 2016-05-26 (×11): 600 mg via ORAL
  Filled 2016-05-21 (×11): qty 1

## 2016-05-21 MED ORDER — DEXTROSE 50 % IV SOLN
INTRAVENOUS | Status: AC
  Administered 2016-05-21: 50 mL
  Filled 2016-05-21: qty 50

## 2016-05-21 MED ORDER — POTASSIUM CHLORIDE CRYS ER 20 MEQ PO TBCR
40.0000 meq | EXTENDED_RELEASE_TABLET | Freq: Two times a day (BID) | ORAL | Status: DC
  Administered 2016-05-21 – 2016-05-26 (×10): 40 meq via ORAL
  Filled 2016-05-21 (×11): qty 2

## 2016-05-21 MED ORDER — METHYLPREDNISOLONE SODIUM SUCC 125 MG IJ SOLR
60.0000 mg | Freq: Three times a day (TID) | INTRAMUSCULAR | Status: DC
  Administered 2016-05-21 – 2016-05-22 (×2): 60 mg via INTRAVENOUS
  Filled 2016-05-21 (×2): qty 2

## 2016-05-21 MED ORDER — FUROSEMIDE 10 MG/ML IJ SOLN
80.0000 mg | Freq: Once | INTRAMUSCULAR | Status: AC
  Administered 2016-05-21: 80 mg via INTRAVENOUS
  Filled 2016-05-21: qty 8

## 2016-05-21 MED ORDER — BENZONATATE 100 MG PO CAPS
200.0000 mg | ORAL_CAPSULE | Freq: Three times a day (TID) | ORAL | Status: DC
  Administered 2016-05-21 – 2016-05-26 (×16): 200 mg via ORAL
  Filled 2016-05-21 (×16): qty 2

## 2016-05-21 MED ORDER — LEVALBUTEROL HCL 0.63 MG/3ML IN NEBU
0.6300 mg | INHALATION_SOLUTION | Freq: Four times a day (QID) | RESPIRATORY_TRACT | Status: DC
  Administered 2016-05-21 – 2016-05-22 (×3): 0.63 mg via RESPIRATORY_TRACT
  Filled 2016-05-21 (×2): qty 3

## 2016-05-21 MED ORDER — AMIODARONE LOAD VIA INFUSION
150.0000 mg | Freq: Once | INTRAVENOUS | Status: AC
  Administered 2016-05-21: 150 mg via INTRAVENOUS
  Filled 2016-05-21: qty 83.34

## 2016-05-21 MED ORDER — SODIUM CHLORIDE 0.9 % IV SOLN: INTRAVENOUS | Status: DC

## 2016-05-21 MED ORDER — SODIUM CHLORIDE 0.9% FLUSH
10.0000 mL | Freq: Two times a day (BID) | INTRAVENOUS | Status: DC
  Administered 2016-05-21 – 2016-05-23 (×3): 10 mL

## 2016-05-21 NOTE — ED Provider Notes (Signed)
I saw and evaluated the patient, reviewed the resident's note and I agree with the findings and plan.   EKG Interpretation  Date/Time:  Friday May 20 2016 19:34:01 EST Ventricular Rate:  135 PR Interval:    QRS Duration: 85 QT Interval:  317 QTC Calculation: 465 R Axis:   81 Text Interpretation:  Sinus or ectopic atrial tachycardia Multiple premature complexes, vent & supraven Borderline right axis deviation Low voltage, precordial leads Borderline repolarization abnormality afib with RVR  Confirmed by Vashaun Osmon MD, Hinton Dyer [phone]) on 05/20/2016 8:04:15 PM      I have independently reviewed the following tracings and/or images and used them in my medical decision making: CXR  CRITICAL CARE Performed by: Forde Dandy   Total critical care time: 35 minutes  Critical care time was exclusive of separately billable procedures and treating other patients.  Critical care was necessary to treat or prevent imminent or life-threatening deterioration.  Critical care was time spent personally by me on the following activities: development of treatment plan with patient and/or surrogate as well as nursing, discussions with consultants, evaluation of patient's response to treatment, examination of patient, obtaining history from patient or surrogate, ordering and performing treatments and interventions, ordering and review of laboratory studies, ordering and review of radiographic studies, pulse oximetry and re-evaluation of patient's condition.   71 year old female who presents with shortness of breath and fatigue. She has a history of paroxysmal A. fib, recently discontinued on Xarelto, diabetes, CHF. Has had one day of shortness of breath and fatigue with cough and sputum production. No known fevers, chest pain, lower extremity edema, calf tenderness, nausea or vomiting, or abdominal pain. No urinary complaints. EMS was called, and patient noticed be in atrial fibrillation with RVR. She was placed on  diltiazem drip in route.  On arrival is febrile to 102, tachycardic with heart rate in the 120s and noted to be in atrial fibrillation with RVR. Normotensive. With increased work of breathing, but maintaining normal oxygenation on nasal cannula.  She is continued on the diltiazem drip for atrial fibrillation with RVR. Sepsis workup pursued, with concern for potential pneumonia. She is empirically covered with antibiotics. Minimally elevated lactic acid of 1.99. No significant end organ damage.   Plan to admit to hospitalist service for ongoing management.      Forde Dandy, MD 05/21/16 [phone]

## 2016-05-21 NOTE — ED Notes (Signed)
At [facility] on 05/21/16 - RN contacted pt's son, [name], at charge RN's request to answer questions as to the pt's property.  RN clarified that pt had been brought into the ED by EMS, which son had initially believed was by a friend.  At the time of arrival she did not have any belongings with her other than the clothing she wore.  When pt arrived on the medical floor she still had the same clothing on.  Pt's son thanked Therapist, sports for the information.

## 2016-05-21 NOTE — Progress Notes (Signed)
Peripherally Inserted Central Catheter/Midline Placement  The IV Nurse has discussed with the patient and/or persons authorized to consent for the patient, the purpose of this procedure and the potential benefits and risks involved with this procedure.  The benefits include less needle sticks, lab draws from the catheter, and the patient may be discharged home with the catheter. Risks include, but not limited to, infection, bleeding, blood clot (thrombus formation), and puncture of an artery; nerve damage and irregular heartbeat and possibility to perform a PICC exchange if needed/ordered by physician.  Alternatives to this procedure were also discussed.  Bard Power PICC patient education guide, fact sheet on infection prevention and patient information card has been provided to patient /or left at bedside.  Consent obtained from son via telephone due to pt lethargy.    PICC/Midline Placement Documentation  PICC Double Lumen 05/21/16 PICC Right Cephalic 38 cm 0 cm (Active)  Indication for Insertion or Continuance of Line Vasoactive infusions;Prolonged intravenous therapies 05/21/2016  6:10 PM  Exposed Catheter (cm) 0 cm 05/21/2016  6:10 PM  Site Assessment Clean;Dry;Intact 05/21/2016  6:10 PM  Lumen #1 Status Flushed;Saline locked;Blood return noted 05/21/2016  6:10 PM  Lumen #2 Status Flushed;Saline locked;Blood return noted 05/21/2016  6:10 PM  Dressing Type Transparent 05/21/2016  6:10 PM  Dressing Status Clean;Dry;Intact;Antimicrobial disc in place 05/21/2016  6:10 PM  Line Care Connections checked and tightened 05/21/2016  6:10 PM  Line Adjustment (NICU/IV Team Only) No 05/21/2016  6:10 PM  Dressing Intervention New dressing 05/21/2016  6:10 PM  Dressing Change Due 05/28/16 05/21/2016  6:10 PM       Rolena Infante 05/21/2016, 6:11 PM

## 2016-05-21 NOTE — Progress Notes (Addendum)
Title: A Randomized, Double-Blind, Placebo-Controlled Dose Ranging Study Evaluating the Safety Pharmacokinetics and Clinical Benefit of FLU-IGIV in Hospitalized Patients with Serious Influenza A infection. IA-001 (ClinicalTrials.gov Identifier: [id number], Protocol No: IA-001, [facility] Protocol [id number])  RESEARCH SUBJECT. This research study is sponsored by [employer]   The investigational product is called NP-025 aka FLU-IGIV or anti-influenza immune globulin intravenous. It is produced from source plasma collected from [place] Transport planner (FDA) licensed plasma collection establishments from healthy donors who have recovered from influenza (convalescent) and/or were vaccinated against seasonal influenza strains. The plasma contains a relatively high concentration of polyclonal antibodies directed against seasonal influenza strains, specifically influenza A strains H1N1 ([place], [place]) and H3N2 ([place]). It is a glycoprotein of 150-160 kilodaltons against Hemagluttinin (HA) and Neuraminidase (NA) surface proteins.   Key Inclusion Criteria: Adult patients; locally determined positive influenza A infection (Rapid Antigen Test or PCR) from a specimen obtained within 2 days prior to randomization; onset of symptoms </= 6 days prior to randomization; experiencing >/= 1 respiratory symptom (cough, sore throat, nasal congestion) and >/= 1 constitutional symptom (headache, myalgia, feverishness or fatigue); NEW score >/=3 at screening; must have an active order for a minimum 5 day course of oseltamivir (75mg /twice daily).  Key Exclusion Criteria: History of hypersensitivity to blood or plasma products; history of allergy to latex or rubber; pregnancy or lactation; known IgA deficiency; medical conditions for which receipt of a 500 mL volume of IV fluid may be dangerous; a pre-existing condition or use of medication that may place the individual at a  substantially increased risk of thrombosis; anticipated life expectancy < 90 days; confirmed bacterial pneumonia or any concurrent respiratory viral infection that is not influenza A.  Key other data: Side effects with infusion  Incidence Occurrence Side effect  Common 1-15%, typically < 5% First 30 minutes of infusion Back pain, abdominal pain, nausea, vomiting  Common 1-15%, typically < 5% First few minutes to several hours after infusion Chills, fever, headache, myalgia, fatigue  Rare Seconds to several hours after infusion Hypersensitivity reaction (happens with IgA deficiency), flushing, facial swelling, dyspnea, cyanosis, shock, cardiac arrest, pneumonitis, renal failure, aseptic meningitis, hemolysis, viral infection  Rare Seconds to several hours after infusion Thrombosis (at risk patients are those with a history of DVT, arterial thrombosis, multiple cardiovascular risk factors, impaired cardiac output, advanced age, coagulation disorders, prolonged immobilization, use of estrogen, indwelling catheters, known/suspected hyperviscosity)  Recommendation: For patients who are at risk of developing thrombotic events, administer NP-025 at the minimum rate of infusion practicable, not to exceed 2 mL/min. Ensure adequate hydration in patients before administration. Monitor  Rare 1-6 hours after infusion Transfusion related Acute Lung Injury (TRALI)  Rare  Hemolysis (dose related, happens in total > 2g/kg and non-O blood group), severe hemolysis may lead to renal dysfunction/failure.  Recommendation: Check hemoglobin pre- and post 36-96h and 7-10 days transfusion  Rare Several hours to two  days after infusion Aseptic Meningitis (AMS) (dose related, happens in total > 2g/kg)   NOTE: NP-025 contains 10% maltose - will interfere with glucose measurements if non-glucose specific measurement devices are not used. Results in false high glucose levels can  result in increased insulin -> life threatening  hypoglycemia  ................................................................................................... PI note  Came to consent patient for above study after she and her boyfriend of > 30  years had expressed interest to research RN Julien Girt earlier in day. However upon my arrival [name] 08/05/1945  physically exhausted  from flu and is also s/p ativan. So, spent 30 min talking about above study , randomization odds with son DPOA [name] and his girl friend [name]. Discussed above and below concepts    1. Scientific Purpose  Clinical research is designed to produce generalizable knowledge and to answer questions about the safety and efficacy of intervention(s) under study in order to determine whether or not they may be useful for the care of future patients.  2. Study Procedures  Participation in a trial may involve procedures or tests, in addition to the intervention(s) under study, that are intended only or primarily to generate scientific knowledge and that are otherwise not necessary for patient care.   3. Uncertainty  For intervention(s) under study in clinical research, there often is less knowledge and more uncertainty about the risks and benefits to a population of trial participants than there is when a doctor offers a patient standard interventions.   4. Adherence to Protocol  Administration of the intervention(s) under study is typically based on a strict protocol with defined dose, scheduling, and use or avoidance of concurrent medications, compared to administration of standard interventions.  5. Clinician as Investigator  Clinicians who are in health care settings provide treatment; in a clinical trial setting, they are also investigating safety and efficacy of an intervention. In otherwise your doctor or nurse practitioner can be wearing 2 hats - one as care giver another as Company secretary  6. Patient as Visual merchandiser  Subject  Patients participating in research trials are research subjects or volunteers. In other words participating in research is 100% voluntary and at one's own free weill. The decision to participate or not participate will NOT affect patient care and the doctor-patient relationship in any way  7. Financial Conflict of Interest Disclosure  One or more of the investigators of  the research trial might an investment interest in [employer], [facility] the clinical trials site and is both the company and the investigators are being compensated for their effort in trial research activities.   8. D/w Dr Thereasa Solo primary hospitalist - no objections to being in study and fluid administration of the IP (he is currently diuresing)    9. Copy of ICF:  Given to son (patient too exhausted to read).   Will reconvene 8am 05/22/16     Dr. Brand Males, M.D., F.C.C.P Pulmonary and Critical Care Medicine Staff Physician [facility] Pager: [phone], If no answer or between  15:00h - 7:00h: call [phone]  05/21/2016 3:23 PM

## 2016-05-21 NOTE — Progress Notes (Signed)
[place] TEAM 1 - Stepdown/ICU TEAM  JOYCELIN [name]  [id number] DOB: [birth year] DOA: 05/20/2016 PCP: Tula Nakayama    Brief Narrative:  71 y.o. female with history of atrial fibrillation, iron deficiency anemia with recent transfusion, depression with anxiety, DM2, and RA who presented w/ c/o severe worsening dyspnea with fever. Patient was discharged from the hospital on 05/17/2016 following tx of symptomatic anemia. She was transfused 1 unit during that admission, had serial negative FOBT's, and was restarted on Xarelto at time of discharge. She had initially done well back at home, but developed subjective fevers and chills with dyspnea and productive cough the day prior to this admit. Dyspnea worsened and the patient activated EMS, who found a heart rate close to 200. She was given 10 mg of diltiazem 2 with improvement and was started on diltiazem infusion.   In the ED CXR noted mild cardiomegaly and pulmonary vascular congestion with bibasilar airspace opacities likely representing mild interstitial edema. CBC was notable for a stable hemoglobin of 8.5 with MCV of 74.9.   Subjective: The patient is displaying significant increased work of breathing.  She states she feels her breathing has just steadily gotten worse even since admission.  She denies chest pain nausea vomiting or abdominal pain.  She is able to complete sentences but is definitely in moderate respiratory distress.  Assessment & Plan:  Sepsis due to Influenza A - acute respiratory distress w/ bronchospasm  - CXR with bibasilar opacities felt to represent edema rather than infection - Influenza PCR positive for influenza A > on Tamiflu  - given significant degree of wheezing will utilize steroids and scheduled neb tx  Chronic Atrial fibrillation with Acute RVR  - noted to be in a fib RVR by EMS and she was treated with 10 mg diltiazem IVP x2 en route  - HR remained 130's despite diltiazem infusion titrated to 15  mg/hr; rate normalized after digoxin 0.25 IV x1  - HR again poorly controlled w/ rate 140 at time of exam - change to amio gtt and follow  - hx of atrial fibrillation on Xarelto, metoprolol, and diltiazem at home  - CHADS-VASc is 88 (age, gender, CHF, DM, HTN)   Acute on chronic diastolic CHF   - Acute pulmonary edema likely secondary to rapid rate - rate control is goal  - baseline weight appears to be ~80kg - TTE (04/06/16) with EF 55-60%, mild concentric hypertrophy, mild MR, and mild LAE; diastolic function could not be determined  - managed at home with Lasix 20 qD and metoprolol Filed Weights   05/20/16 2253 05/21/16 0500  Weight: 78.9 kg (173 lb 15.1 oz) 78.7 kg (173 lb 8 oz)    Iron-deficiency anemia - Hgb 8.5 on admission with MCV 74.9  - recently hospitalized with symptomatic anemia and was transfused 1 unit; H/H remained stable - had serial negative FOBT's   Rheumatoid arthritis - Managed with leflunomide and prednisone 5 mg qD at home  DM2 - recurring hypoglycemia - A1c 6.0 November 2017   Hypertension - well controlled at this time  Hypokalemia - replace and follow - check Mg  Depression, anxiety  - Stable   DVT prophylaxis: xarelto  Code Status: FULL CODE Family Communication: no family present at time of exam  Disposition Plan:   Consultants:  none  Procedures: none  Antimicrobials:  Anti-infectives    Start     Dose/Rate Route Frequency Ordered Stop   05/21/16 0930  vancomycin (VANCOCIN) IVPB 1000 mg/200  mL premix     1,000 mg 200 mL/hr over 60 Minutes Intravenous Every 12 hours 05/20/16 2102     05/21/16 0300  piperacillin-tazobactam (ZOSYN) IVPB 3.375 g     3.375 g 12.5 mL/hr over 240 Minutes Intravenous Every 8 hours 05/20/16 2102     05/21/16 0230  oseltamivir (TAMIFLU) capsule 75 mg     75 mg Oral 2 times daily 05/21/16 0159 05/25/16 2159   05/20/16 2045  piperacillin-tazobactam (ZOSYN) IVPB 3.375 g     3.375 g 100 mL/hr over 30 Minutes  Intravenous  Once 05/20/16 2039 05/20/16 2145   05/20/16 2045  vancomycin (VANCOCIN) IVPB 1000 mg/200 mL premix     1,000 mg 200 mL/hr over 60 Minutes Intravenous  Once 05/20/16 2039 05/20/16 2210       Objective: Blood pressure 132/65, pulse (!) 131, temperature 99.5 F (37.5 C), temperature source Oral, resp. rate (!) 32, height 5\' 7"  (1.702 m), weight 78.7 kg (173 lb 8 oz), SpO2 100 %.  Intake/Output Summary (Last 24 hours) at 05/21/16 1322 Last data filed at 05/21/16 1200  Gross per 24 hour  Intake          1249.17 ml  Output              801 ml  Net           448.17 ml   Filed Weights   05/20/16 2253 05/21/16 0500  Weight: 78.9 kg (173 lb 15.1 oz) 78.7 kg (173 lb 8 oz)    Examination: General: clear resp distress w/ diffuse wheeze and tachypnea  Lungs: course crackles th/o - diffuse exp wheeze - tachycpnea  Cardiovascular: Irreg irreg - HR 140bpm - no M appreciable  Abdomen: Nontender, nondistended, soft, bowel sounds positive, no rebound, no ascites, no appreciable mass Extremities: 1+ edema bilateral lower extremities  CBC:  Recent Labs Lab 05/16/16 1126 05/16/16 1810 05/17/16 0806 05/20/16 1937 05/21/16 0203  WBC 9.4 9.0  --  9.7 8.3  NEUTROABS  --   --   --  8.4* 6.8  HGB 7.2* 7.4* 7.7* 8.5* 8.1*  HCT 26.1* 26.6* 26.4* 29.2* 28.6*  MCV 71.3* 69.6*  --  74.9* 75.1*  PLT 311 316  --  235 [id number]   Basic Metabolic Panel:  Recent Labs Lab 05/16/16 1810 05/17/16 0806 05/20/16 1937 05/21/16 0203  NA 141 139 135 137  K 4.2 4.0 3.7 3.1*  CL 106 105 104 103  CO2 24 24 19* 22  GLUCOSE 104* 129* 120* 54*  BUN 14 12 11 10   CREATININE 0.67 0.57 0.64 0.66  CALCIUM 8.9 8.7* 8.6* 8.4*   GFR: Estimated Creatinine Clearance: 69.6 mL/min (by C-G formula based on SCr of 0.66 mg/dL).  Liver Function Tests:  Recent Labs Lab 05/20/16 1937 05/21/16 0203  AST 33 26  ALT 13* 11*  ALKPHOS 115 103  BILITOT 0.9 1.1  PROT 6.2* 5.8*  ALBUMIN 3.1* 2.7*    Cardiac  Enzymes:  Recent Labs Lab 05/20/16 2248 05/21/16 0203 05/21/16 0925  TROPONINI 0.03* 0.03* 0.03*    HbA1C: Hgb A1c MFr Bld  Date/Time Value Ref Range Status  02/03/2016 09:59 AM 6.0 (H) 4.8 - 5.6 % Final    Comment:    (NOTE)         Pre-diabetes: 5.7 - 6.4         Diabetes: >6.4         Glycemic control for adults with diabetes: <7.0   02/22/2015 01:55  AM 7.2 (H) 4.8 - 5.6 % Final    Comment:    (NOTE)         Pre-diabetes: 5.7 - 6.4         Diabetes: >6.4         Glycemic control for adults with diabetes: <7.0     CBG:  Recent Labs Lab 05/21/16 0316 05/21/16 0744 05/21/16 0818 05/21/16 1155 05/21/16 1209  GLUCAP 86 27* 106* 46* 126*    Recent Results (from the past 240 hour(s))  Blood culture (routine x 2)     Status: None (Preliminary result)   Collection Time: 05/20/16  8:45 PM  Result Value Ref Range Status   Specimen Description BLOOD RIGHT HAND  Final   Special Requests BOTTLES DRAWN AEROBIC AND ANAEROBIC 5CC  Final   Culture NO GROWTH < 24 HOURS  Final   Report Status PENDING  Incomplete  Blood culture (routine x 2)     Status: None (Preliminary result)   Collection Time: 05/20/16  8:50 PM  Result Value Ref Range Status   Specimen Description BLOOD LEFT HAND  Final   Special Requests BOTTLES DRAWN AEROBIC AND ANAEROBIC 5CC  Final   Culture NO GROWTH < 24 HOURS  Final   Report Status PENDING  Incomplete  MRSA PCR Screening     Status: None   Collection Time: 05/20/16 10:57 PM  Result Value Ref Range Status   MRSA by PCR NEGATIVE NEGATIVE Final    Comment:        The GeneXpert MRSA Assay (FDA approved for NASAL specimens only), is one component of a comprehensive MRSA colonization surveillance program. It is not intended to diagnose MRSA infection nor to guide or monitor treatment for MRSA infections.      Scheduled Meds: . benzonatate  200 mg Oral TID  . buPROPion  150 mg Oral BID WC  . cholecalciferol  1,000 Units Oral Daily  .  ferrous gluconate  324 mg Oral BID WC  . guaiFENesin  600 mg Oral BID  . insulin aspart  0-5 Units Subcutaneous QHS  . insulin aspart  0-9 Units Subcutaneous TID WC  . levalbuterol  0.63 mg Nebulization Q6H  . mouth rinse  15 mL Mouth Rinse BID  . methylPREDNISolone (SOLU-MEDROL) injection  40 mg Intravenous Daily  . omega-3 acid ethyl esters  1,000 mg Oral Daily  . oseltamivir  75 mg Oral BID  . pantoprazole  40 mg Oral Daily  . piperacillin-tazobactam (ZOSYN)  IV  3.375 g Intravenous Q8H  . potassium chloride  20 mEq Oral BID  . rivaroxaban  20 mg Oral Q supper  . rosuvastatin  10 mg Oral Daily  . sodium chloride flush  3 mL Intravenous Q12H  . sodium chloride flush  3 mL Intravenous Q12H  . vancomycin  1,000 mg Intravenous Q12H   Continuous Infusions: . diltiazem (CARDIZEM) infusion 15 mg/hr (05/21/16 1013)     LOS: 1 day   Cherene Altes, MD [facility] Office  [phone] Pager - Text Page per Shea Evans as per below:  On-Call/Text Page:      [email or web address]      password [email or web address]  If 7PM-7AM, please contact night-coverage [email or web address] Password [email or web address] 05/21/2016, 1:22 PM

## 2016-05-21 NOTE — Progress Notes (Signed)
Flu A PCR positive, Dr. Maudie Mercury notified and aware. To place orders for tamiflu.

## 2016-05-21 NOTE — Discharge Instructions (Addendum)

## 2016-05-22 ENCOUNTER — Inpatient Hospital Stay (HOSPITAL_COMMUNITY): Payer: Medicare Other

## 2016-05-22 LAB — CBC
HCT: 27.9 % — ABNORMAL LOW (ref 36.0–46.0)
Hemoglobin: 7.8 g/dL — ABNORMAL LOW (ref 12.0–15.0)
MCH: 21.4 pg — ABNORMAL LOW (ref 26.0–34.0)
MCHC: 28 g/dL — AB (ref 30.0–36.0)
MCV: 76.4 fL — ABNORMAL LOW (ref 78.0–100.0)
Platelets: 191 10*3/uL (ref 150–400)
RBC: 3.65 MIL/uL — ABNORMAL LOW (ref 3.87–5.11)
RDW: 26 % — AB (ref 11.5–15.5)
WBC: 9.8 10*3/uL (ref 4.0–10.5)

## 2016-05-22 LAB — COMPREHENSIVE METABOLIC PANEL
ALBUMIN: 2.7 g/dL — AB (ref 3.5–5.0)
ALK PHOS: 99 U/L (ref 38–126)
ALT: 27 U/L (ref 14–54)
ANION GAP: 10 (ref 5–15)
AST: 45 U/L — AB (ref 15–41)
BILIRUBIN TOTAL: 0.8 mg/dL (ref 0.3–1.2)
BUN: 14 mg/dL (ref 6–20)
CALCIUM: 8.1 mg/dL — AB (ref 8.9–10.3)
CO2: 24 mmol/L (ref 22–32)
Chloride: 103 mmol/L (ref 101–111)
Creatinine, Ser: 0.79 mg/dL (ref 0.44–1.00)
GFR calc Af Amer: >60 mL/min (ref >60)
GLUCOSE: 272 mg/dL — AB (ref 65–99)
Potassium: 4.2 mmol/L (ref 3.5–5.1)
Sodium: 137 mmol/L (ref 135–145)
TOTAL PROTEIN: 5.8 g/dL — AB (ref 6.5–8.1)

## 2016-05-22 LAB — GLUCOSE, CAPILLARY
Glucose-Capillary: 230 mg/dL — ABNORMAL HIGH (ref 65–99)
Glucose-Capillary: 342 mg/dL — ABNORMAL HIGH (ref 65–99)
Glucose-Capillary: 389 mg/dL — ABNORMAL HIGH (ref 65–99)
Glucose-Capillary: 316 mg/dL — ABNORMAL HIGH (ref 65–99)

## 2016-05-22 LAB — URINE CULTURE

## 2016-05-22 LAB — MAGNESIUM: MAGNESIUM: 1.9 mg/dL (ref 1.7–2.4)

## 2016-05-22 MED ORDER — INSULIN ASPART 100 UNIT/ML ~~LOC~~ SOLN
0.0000 [IU] | Freq: Three times a day (TID) | SUBCUTANEOUS | Status: DC
  Administered 2016-05-22: 20 [IU] via SUBCUTANEOUS
  Administered 2016-05-23: 7 [IU] via SUBCUTANEOUS
  Administered 2016-05-23 (×2): 20 [IU] via SUBCUTANEOUS
  Administered 2016-05-24: 4 [IU] via SUBCUTANEOUS
  Administered 2016-05-24: 11 [IU] via SUBCUTANEOUS
  Administered 2016-05-24 – 2016-05-25 (×2): 20 [IU] via SUBCUTANEOUS
  Administered 2016-05-25 – 2016-05-26 (×4): 4 [IU] via SUBCUTANEOUS

## 2016-05-22 MED ORDER — LEVALBUTEROL HCL 0.63 MG/3ML IN NEBU
0.6300 mg | INHALATION_SOLUTION | Freq: Three times a day (TID) | RESPIRATORY_TRACT | Status: DC
  Administered 2016-05-22 – 2016-05-24 (×6): 0.63 mg via RESPIRATORY_TRACT
  Filled 2016-05-22 (×6): qty 3

## 2016-05-22 MED ORDER — METHYLPREDNISOLONE SODIUM SUCC 40 MG IJ SOLR
40.0000 mg | Freq: Two times a day (BID) | INTRAMUSCULAR | Status: DC
  Administered 2016-05-22 – 2016-05-25 (×6): 40 mg via INTRAVENOUS
  Filled 2016-05-22 (×6): qty 1

## 2016-05-22 MED ORDER — INSULIN ASPART 100 UNIT/ML ~~LOC~~ SOLN
0.0000 [IU] | Freq: Every day | SUBCUTANEOUS | Status: DC
  Administered 2016-05-22: 4 [IU] via SUBCUTANEOUS
  Administered 2016-05-24: 3 [IU] via SUBCUTANEOUS
  Administered 2016-05-25: 4 [IU] via SUBCUTANEOUS

## 2016-05-22 MED ORDER — DIGOXIN 0.25 MG/ML IJ SOLN
0.1250 mg | Freq: Every day | INTRAMUSCULAR | Status: DC
  Administered 2016-05-23: 0.125 mg via INTRAVENOUS
  Filled 2016-05-22: qty 2

## 2016-05-22 MED ORDER — DILTIAZEM HCL 30 MG PO TABS
30.0000 mg | ORAL_TABLET | Freq: Once | ORAL | Status: AC
Start: 2016-05-22 — End: 2016-05-22
  Administered 2016-05-22: 30 mg via ORAL
  Filled 2016-05-22: qty 1

## 2016-05-22 MED ORDER — DILTIAZEM HCL 30 MG PO TABS
30.0000 mg | ORAL_TABLET | Freq: Three times a day (TID) | ORAL | Status: DC
  Administered 2016-05-22 – 2016-05-23 (×4): 30 mg via ORAL
  Filled 2016-05-22 (×4): qty 1

## 2016-05-22 MED ORDER — FUROSEMIDE 10 MG/ML IJ SOLN
40.0000 mg | Freq: Every day | INTRAMUSCULAR | Status: DC
  Administered 2016-05-22 – 2016-05-26 (×5): 40 mg via INTRAVENOUS
  Filled 2016-05-22 (×5): qty 4

## 2016-05-22 MED ORDER — DILTIAZEM HCL 25 MG/5ML IV SOLN
10.0000 mg | Freq: Once | INTRAVENOUS | Status: AC
Start: 2016-05-22 — End: 2016-05-22
  Administered 2016-05-22: 10 mg via INTRAVENOUS
  Filled 2016-05-22: qty 5

## 2016-05-22 MED ORDER — IPRATROPIUM BROMIDE 0.02 % IN SOLN
0.5000 mg | Freq: Three times a day (TID) | RESPIRATORY_TRACT | Status: DC
  Administered 2016-05-22 – 2016-05-24 (×6): 0.5 mg via RESPIRATORY_TRACT
  Filled 2016-05-22 (×6): qty 2.5

## 2016-05-22 NOTE — Progress Notes (Signed)
[place] TEAM 1 - Stepdown/ICU TEAM  [name]  [id number] DOB: 10/03/1945 DOA: 05/20/2016 PCP: Tula Nakayama    Brief Narrative:  71 y.o. female with history of atrial fibrillation, iron deficiency anemia with recent transfusion, depression with anxiety, DM2, and RA who presented w/ c/o severe worsening dyspnea with fever. Patient was discharged from the hospital on 05/17/2016 following tx of symptomatic anemia. She was transfused 1 unit during that admission, had serial negative FOBT's, and was restarted on Xarelto at time of discharge. She had initially done well back at home, but developed subjective fevers and chills with dyspnea and productive cough the day prior to this admit. Dyspnea worsened and the patient activated EMS, who found a heart rate close to 200. She was given 10 mg of diltiazem 2 with improvement and was started on diltiazem infusion.   In the ED CXR noted mild cardiomegaly and pulmonary vascular congestion with bibasilar airspace opacities likely representing mild interstitial edema. CBC was notable for a stable hemoglobin of 8.5 with MCV of 74.9.   Subjective: Much improved today.  Signif less dyspneic.  Denies cp, n/v, or abdom pain.    Assessment & Plan:  Sepsis due to Influenza A - acute respiratory distress w/ bronchospasm  - CXR with bibasilar opacities felt to represent edema rather than infection - Influenza PCR positive for influenza A > on Tamiflu + IVIG study drug v/s placebo - wheezing essentially resolved - stop steroids and follow   Chronic Atrial fibrillation with Acute RVR  - noted to be in a fib RVR by EMS and she was treated with 10 mg diltiazem IVP x2 en route  - HR remained 130's despite diltiazem infusion titrated to 15 mg/hr; rate normalized after digoxin 0.25 IV x1  - HR again poorly controlled w/ rate 140 at time of exam - initiated amio gtt w/o control - loaded w/ IV digoxin  - hx of atrial fibrillation on Xarelto, metoprolol,  and diltiazem at home  - CHADS-VASc is 42 (age, gender, CHF, DM, HTN)  - HR improved, but not yet ideal - cont current measures and follow   Acute on chronic diastolic CHF   - Acute pulmonary edema likely secondary to rapid rate - rate control is goal  - baseline weight appears to be ~80kg - TTE (04/06/16) with EF 55-60%, mild concentric hypertrophy, mild MR, and mild LAE; diastolic function could not be determined  - managed at home with Lasix 20 QD and metoprolol - resume usual lasix dose and follow weights / Is/Os Filed Weights   05/20/16 2253 05/21/16 0500 05/22/16 0300  Weight: 78.9 kg (173 lb 15.1 oz) 78.7 kg (173 lb 8 oz) 81.4 kg (179 lb 7.3 oz)    Iron-deficiency anemia - Hgb 8.5 on admission with MCV 74.9  - recently hospitalized with symptomatic anemia and was transfused 1 unit; H/H remained stable - had serial negative FOBT's - if Hgb remains < 8.0 on f/u will transfuse to goal of > 7.9   Rheumatoid arthritis - Managed with leflunomide and prednisone 5 mg qD at home  DM2 - recurring hypoglycemia - A1c 6.0 November 2017 - CBG now elevated w/ steroid use - adjust insulin tx and follow   Hypertension - controlled at this time  Hypokalemia - replaced - Mg is normal   Depression, anxiety  - Stable   DVT prophylaxis: xarelto  Code Status: FULL CODE Family Communication: Spoke with husband and son at bedside Disposition Plan: SDU  Consultants:  none  Procedures: none  Antimicrobials:  Anti-infectives    Start     Dose/Rate Route Frequency Ordered Stop   05/21/16 0930  vancomycin (VANCOCIN) IVPB 1000 mg/200 mL premix  Status:  Discontinued     1,000 mg 200 mL/hr over 60 Minutes Intravenous Every 12 hours 05/20/16 2102 05/21/16 1337   05/21/16 0300  piperacillin-tazobactam (ZOSYN) IVPB 3.375 g     3.375 g 12.5 mL/hr over 240 Minutes Intravenous Every 8 hours 05/20/16 2102     05/21/16 0230  oseltamivir (TAMIFLU) capsule 75 mg     75 mg Oral 2 times daily  05/21/16 0159 05/25/16 2159   05/20/16 2045  piperacillin-tazobactam (ZOSYN) IVPB 3.375 g     3.375 g 100 mL/hr over 30 Minutes Intravenous  Once 05/20/16 2039 05/20/16 2145   05/20/16 2045  vancomycin (VANCOCIN) IVPB 1000 mg/200 mL premix     1,000 mg 200 mL/hr over 60 Minutes Intravenous  Once 05/20/16 2039 05/20/16 2210       Objective: Blood pressure 102/70, pulse (!) 103, temperature 98.2 F (36.8 C), temperature source Oral, resp. rate 19, height 5\' 7"  (1.702 m), weight 81.4 kg (179 lb 7.3 oz), SpO2 100 %.  Intake/Output Summary (Last 24 hours) at 05/22/16 1139 Last data filed at 05/22/16 0800  Gross per 24 hour  Intake           1168.5 ml  Output             6500 ml  Net          -5331.5 ml   Filed Weights   05/20/16 2253 05/21/16 0500 05/22/16 0300  Weight: 78.9 kg (173 lb 15.1 oz) 78.7 kg (173 lb 8 oz) 81.4 kg (179 lb 7.3 oz)    Examination: General: Respirations are much more comfortable with no acute distress Lungs: Fine crackles throughout all fields with no active wheezing Cardiovascular: Irreg irreg - HR 104bpm - no M  Abdomen: Nontender, nondistended, soft, bowel sounds positive, no rebound, no ascites, no appreciable mass Extremities: trace edema bilateral lower extremities  CBC:  Recent Labs Lab 05/16/16 1126 05/16/16 1810 05/17/16 0806 05/20/16 1937 05/21/16 0203 05/22/16 0522  WBC 9.4 9.0  --  9.7 8.3 9.8  NEUTROABS  --   --   --  8.4* 6.8  --   HGB 7.2* 7.4* 7.7* 8.5* 8.1* 7.8*  HCT 26.1* 26.6* 26.4* 29.2* 28.6* 27.9*  MCV 71.3* 69.6*  --  74.9* 75.1* 76.4*  PLT 311 316  --  235 221 [id number]   Basic Metabolic Panel:  Recent Labs Lab 05/16/16 1810 05/17/16 0806 05/20/16 1937 05/21/16 0203 05/22/16 0522  NA 141 139 135 137 137  K 4.2 4.0 3.7 3.1* 4.2  CL 106 105 104 103 103  CO2 24 24 19* 22 24  GLUCOSE 104* 129* 120* 54* 272*  BUN 14 12 11 10 14   CREATININE 0.67 0.57 0.64 0.66 0.79  CALCIUM 8.9 8.7* 8.6* 8.4* 8.1*  MG  --   --   --   --   1.9   GFR: Estimated Creatinine Clearance: 70.8 mL/min (by C-G formula based on SCr of 0.79 mg/dL).  Liver Function Tests:  Recent Labs Lab 05/20/16 1937 05/21/16 0203 05/22/16 0522  AST 33 26 45*  ALT 13* 11* 27  ALKPHOS 115 103 99  BILITOT 0.9 1.1 0.8  PROT 6.2* 5.8* 5.8*  ALBUMIN 3.1* 2.7* 2.7*    Cardiac Enzymes:  Recent Labs Lab 05/20/16 2248 05/21/16  0203 05/21/16 0925  TROPONINI 0.03* 0.03* 0.03*    HbA1C: Hgb A1c MFr Bld  Date/Time Value Ref Range Status  02/03/2016 09:59 AM 6.0 (H) 4.8 - 5.6 % Final    Comment:    (NOTE)         Pre-diabetes: 5.7 - 6.4         Diabetes: >6.4         Glycemic control for adults with diabetes: <7.0   02/22/2015 01:55 AM 7.2 (H) 4.8 - 5.6 % Final    Comment:    (NOTE)         Pre-diabetes: 5.7 - 6.4         Diabetes: >6.4         Glycemic control for adults with diabetes: <7.0     CBG:  Recent Labs Lab 05/21/16 1155 05/21/16 1209 05/21/16 1612 05/21/16 2101 05/22/16 0754  GLUCAP 46* 126* 203* 390* 230*    Recent Results (from the past 240 hour(s))  Blood culture (routine x 2)     Status: None (Preliminary result)   Collection Time: 05/20/16  8:45 PM  Result Value Ref Range Status   Specimen Description BLOOD RIGHT HAND  Final   Special Requests BOTTLES DRAWN AEROBIC AND ANAEROBIC 5CC  Final   Culture NO GROWTH 2 DAYS  Final   Report Status PENDING  Incomplete  Blood culture (routine x 2)     Status: None (Preliminary result)   Collection Time: 05/20/16  8:50 PM  Result Value Ref Range Status   Specimen Description BLOOD LEFT HAND  Final   Special Requests BOTTLES DRAWN AEROBIC AND ANAEROBIC 5CC  Final   Culture NO GROWTH 2 DAYS  Final   Report Status PENDING  Incomplete  MRSA PCR Screening     Status: None   Collection Time: 05/20/16 10:57 PM  Result Value Ref Range Status   MRSA by PCR NEGATIVE NEGATIVE Final    Comment:        The GeneXpert MRSA Assay (FDA approved for NASAL specimens only),  is one component of a comprehensive MRSA colonization surveillance program. It is not intended to diagnose MRSA infection nor to guide or monitor treatment for MRSA infections.   Urine culture     Status: Abnormal   Collection Time: 05/21/16  2:30 AM  Result Value Ref Range Status   Specimen Description URINE, CATHETERIZED  Final   Special Requests NONE  Final   Culture <10,000 COLONIES/mL INSIGNIFICANT GROWTH (A)  Final   Report Status 05/22/2016 FINAL  Final     Scheduled Meds: . benzonatate  200 mg Oral TID  . buPROPion  150 mg Oral BID WC  . cholecalciferol  1,000 Units Oral Daily  . guaiFENesin  600 mg Oral BID  . insulin aspart  0-5 Units Subcutaneous QHS  . insulin aspart  0-9 Units Subcutaneous TID WC  . ipratropium  0.5 mg Nebulization TID  . levalbuterol  0.63 mg Nebulization TID  . mouth rinse  15 mL Mouth Rinse BID  . methylPREDNISolone (SOLU-MEDROL) injection  60 mg Intravenous Q8H  . omega-3 acid ethyl esters  1,000 mg Oral Daily  . oseltamivir  75 mg Oral BID  . pantoprazole  40 mg Oral Daily  . piperacillin-tazobactam (ZOSYN)  IV  3.375 g Intravenous Q8H  . potassium chloride  40 mEq Oral BID  . rivaroxaban  20 mg Oral Q supper  . rosuvastatin  10 mg Oral Daily  . sodium chloride flush  10-40 mL Intracatheter Q12H  . sodium chloride flush  3 mL Intravenous Q12H   Continuous Infusions: . sodium chloride    . amiodarone 30 mg/hr (05/22/16 0800)     LOS: 2 days   Cherene Altes, MD [facility] Office  [phone] Pager - Text Page per Amion as per below:  On-Call/Text Page:      [email or web address]      password [email or web address]  If 7PM-7AM, please contact night-coverage [email or web address] Password [facility] 05/22/2016, 11:39 AM

## 2016-05-22 NOTE — Research (Signed)
Title: A Randomized, Double-Blind, Placebo-Controlled Dose Ranging Study Evaluating the Safety Pharmacokinetics and Clinical Benefit of FLU-IGIV in Hospitalized Patients with Serious Influenza A infection. IA-001 (ClinicalTrials.gov Identifier: [id number], Protocol No: IA-001, [facility] Protocol [id number])  RESEARCH SUBJECT. This research study is sponsored by [employer]   The investigational product is called NP-025 aka FLU-IGIV or anti-influenza immune globulin intravenous. It is produced from source plasma collected from [place] Transport planner (FDA) licensed plasma collection establishments from healthy donors who have recovered from influenza (convalescent) and/or were vaccinated against seasonal influenza strains. The plasma contains a relatively high concentration of polyclonal antibodies directed against seasonal influenza strains, specifically influenza A strains H1N1 ([place], [place]) and H3N2 ([place]). It is a glycoprotein of 150-160 kilodaltons against Hemagluttinin (HA) and Neuraminidase (NA) surface proteins.   ...................................................................................................  Clinical Research Coordinator / Research RN note : This visit for Subject [name] with [birth year] on 05/22/2016 for the above protocol is for purpose of pre-consent. The consent for this encounter is under Protocol Version 3.0 and IS currently IRB approved. In this visit 05/22/2016 the subject will be evaluated by investigator named Brand Males, MD. This research coordinator has verified that the investigator IS uptodate with his training logs.  Per subject's son, [name], subject has refused participation in this clinical trial and is not interested in being a research subject. PI, Ramaswamy made aware. This subject will NOT be enrolled in this clinical trial and will continue to receive standard of care treatment for  Influenza.   Dennison Mascot, RN Clinical Research Nurse  [facility] Office: [phone] 9:29 AM 05/22/2016

## 2016-05-23 DIAGNOSIS — J101 Influenza due to other identified influenza virus with other respiratory manifestations: Secondary | ICD-10-CM

## 2016-05-23 DIAGNOSIS — D500 Iron deficiency anemia secondary to blood loss (chronic): Secondary | ICD-10-CM

## 2016-05-23 DIAGNOSIS — I4891 Unspecified atrial fibrillation: Secondary | ICD-10-CM

## 2016-05-23 DIAGNOSIS — I5031 Acute diastolic (congestive) heart failure: Secondary | ICD-10-CM

## 2016-05-23 LAB — BASIC METABOLIC PANEL
ANION GAP: 8 (ref 5–15)
BUN: 17 mg/dL (ref 6–20)
CALCIUM: 8.6 mg/dL — AB (ref 8.9–10.3)
CO2: 28 mmol/L (ref 22–32)
Chloride: 106 mmol/L (ref 101–111)
Creatinine, Ser: 0.63 mg/dL (ref 0.44–1.00)
GFR calc non Af Amer: >60 mL/min (ref >60)
Glucose, Bld: 236 mg/dL — ABNORMAL HIGH (ref 65–99)
Potassium: 4.3 mmol/L (ref 3.5–5.1)
SODIUM: 142 mmol/L (ref 135–145)

## 2016-05-23 LAB — CBC
HEMATOCRIT: 28.9 % — AB (ref 36.0–46.0)
HEMOGLOBIN: 8.2 g/dL — AB (ref 12.0–15.0)
MCH: 22 pg — ABNORMAL LOW (ref 26.0–34.0)
MCHC: 28.4 g/dL — ABNORMAL LOW (ref 30.0–36.0)
MCV: 77.5 fL — ABNORMAL LOW (ref 78.0–100.0)
Platelets: 181 10*3/uL (ref 150–400)
RBC: 3.73 MIL/uL — ABNORMAL LOW (ref 3.87–5.11)
RDW: 26.4 % — AB (ref 11.5–15.5)
WBC: 9.9 10*3/uL (ref 4.0–10.5)

## 2016-05-23 LAB — GLUCOSE, CAPILLARY
GLUCOSE-CAPILLARY: 234 mg/dL — AB (ref 65–99)
GLUCOSE-CAPILLARY: 373 mg/dL — AB (ref 65–99)
GLUCOSE-CAPILLARY: 385 mg/dL — AB (ref 65–99)
GLUCOSE-CAPILLARY: 428 mg/dL — AB (ref 65–99)

## 2016-05-23 MED ORDER — DILTIAZEM HCL 60 MG PO TABS
60.0000 mg | ORAL_TABLET | Freq: Four times a day (QID) | ORAL | Status: AC
Start: 2016-05-23 — End: 2016-05-24
  Administered 2016-05-23 – 2016-05-24 (×4): 60 mg via ORAL
  Filled 2016-05-23 (×4): qty 1

## 2016-05-23 MED ORDER — INSULIN ASPART 100 UNIT/ML ~~LOC~~ SOLN
8.0000 [IU] | Freq: Once | SUBCUTANEOUS | Status: AC
  Administered 2016-05-23: 8 [IU] via SUBCUTANEOUS

## 2016-05-23 MED ORDER — INSULIN GLARGINE 100 UNIT/ML ~~LOC~~ SOLN
16.0000 [IU] | Freq: Every day | SUBCUTANEOUS | Status: DC
  Administered 2016-05-23 – 2016-05-25 (×3): 16 [IU] via SUBCUTANEOUS
  Filled 2016-05-23 (×4): qty 0.16

## 2016-05-23 MED ORDER — METOPROLOL TARTRATE 50 MG PO TABS
50.0000 mg | ORAL_TABLET | Freq: Two times a day (BID) | ORAL | Status: DC
  Administered 2016-05-23 – 2016-05-25 (×4): 50 mg via ORAL
  Filled 2016-05-23 (×4): qty 1

## 2016-05-23 NOTE — Progress Notes (Signed)
Pharmacy Antibiotic Note  [name] is a 71 y.o. female admitted on 05/20/2016 with concern of  sepsis on Zosyn. She is noted with influenza A on Tamiflu. -WBC= 9.9, afebrile, CrCl ~ 70, cultures ngtd  Plan: -No zosyn dose changes needed -Consider discontinuing Zosyn unless there is a superimposed infection (maybe able to consider po antibiotics?  Height: 5\' 7"  (170.2 cm) Weight: 175 lb 8 oz (79.6 kg) IBW/kg (Calculated) : 61.6  Temp (24hrs), Avg:97.9 F (36.6 C), Min:97.6 F (36.4 C), Max:98.4 F (36.9 C)   Recent Labs Lab 05/16/16 1810 05/17/16 0806 05/20/16 1937 05/20/16 1948 05/20/16 2248 05/21/16 0203 05/22/16 0522 05/23/16 0516  WBC 9.0  --  9.7  --   --  8.3 9.8 9.9  CREATININE 0.67 0.57 0.64  --   --  0.66 0.79 0.63  LATICACIDVEN  --   --   --  1.99* 1.3 1.3  --   --     Estimated Creatinine Clearance: 70.1 mL/min (by C-G formula based on SCr of 0.63 mg/dL).    No Known Allergies  Antimicrobials this admission: 2/16 Zosyn >> 2/17 2/16 Vancomycin >>   Dose adjustments this admission: none  Microbiology results: 2/16 UCx: ng 2/16 BCx: ngtd MRSA pcr neg Flu A +  Thank you for allowing pharmacy to be a part of this patient's care.  Hildred Laser, Pharm D 05/23/2016 10:34 AM

## 2016-05-23 NOTE — Care Management Important Message (Signed)
Important Message  Patient Details  Name: GALILEA RECLA MRN: [id number] Date of Birth: 1945/10/02   Medicare Important Message Given:  Yes    Lacretia Leigh, RN 05/23/2016, 10:56 AM

## 2016-05-23 NOTE — Consult Note (Signed)
Cardiology Consult    Patient ID: [name] MRN: [id number], DOB/AGE: Mar 16, 1946   Admit date: 05/20/2016 Date of Consult: 05/23/2016  Primary Physician: Tula Nakayama Primary Cardiologist: Dr. Stanford Breed Requesting Provider: Dr. Thereasa Solo  Reason for Consult: Afib RVR  Patient Profile    [name] is a 71 yo female with a PMH significant for Afib, HTN, DM, CHF, hepatitis and symptomatic bradycardia. She was brought to ED for dyspnea, productive cough in the setting of possible sepsis. She was also found to be in Afib RVR. Cardiology was consulted for management of her Afib RVR.  Past Medical History   Past Medical History:  Diagnosis Date  . Actinic keratosis   . Anemia    hx of  . Anemia of chronic renal failure, stage 3 (moderate) 07/17/2015  . Anticoagulant causing adverse effect in therapeutic use 07/17/2015  . Anxiety   . Atrial fibrillation ([place])   . Atrial fibrillation ([place])   . Carpal tunnel syndrome   . Chest pain   . Depression   . Edema   . Gastroesophageal reflux disease   . Hepatitis 1970  . Hiatal hernia   . Hyperlipidemia   . Hypertension   . Iron deficiency anemia due to chronic blood loss 07/17/2015  . Jaundice    age 43  . Memory loss   . Peripheral vascular disease ([facility])    DVT left lower leg  . Rheumatoid arthritis(714.0)   . Thyroid disease    multi nodular goiter  . Type II diabetes mellitus ([place])     Past Surgical History:  Procedure Laterality Date  . CATARACT EXTRACTION Left   . COLONOSCOPY WITH PROPOFOL N/A 02/20/2015   Procedure: COLONOSCOPY WITH PROPOFOL;  Surgeon: Carol Ada, MD;  Location: [facility];  Service: Endoscopy;  Laterality: N/A;  . ENTEROSCOPY N/A 03/23/2016   Procedure: ENTEROSCOPY;  Surgeon: Carol Ada, MD;  Location: [facility];  Service: Endoscopy;  Laterality: N/A;  . ESOPHAGOGASTRODUODENOSCOPY (EGD) WITH PROPOFOL N/A 02/20/2015   Procedure: ESOPHAGOGASTRODUODENOSCOPY (EGD) WITH PROPOFOL;  Surgeon:  Carol Ada, MD;  Location: [facility];  Service: Endoscopy;  Laterality: N/A;  . GIVENS CAPSULE STUDY N/A 03/21/2016   Procedure: GIVENS CAPSULE STUDY;  Surgeon: Carol Ada, MD;  Location: [facility];  Service: Endoscopy;  Laterality: N/A;  . HAND SURGERY  1995 and 1996   artificial joints both hands  . JOINT REPLACEMENT    . LUMBAR LAMINECTOMY/DECOMPRESSION MICRODISCECTOMY N/A 02/10/2016   Procedure: MICROLUMBAR DECOMPRESSION L4-L5 AND L3- L4, AND EXCISION OF SYNOVIAL CYST L4-L5;  Surgeon: Susa Day, MD;  Location: [facility];  Service: Orthopedics;  Laterality: N/A;  . TOTAL HIP ARTHROPLASTY Left 2009  . TOTAL HIP ARTHROPLASTY Right 04/03/2013   Procedure: RIGHT TOTAL HIP ARTHROPLASTY ANTERIOR APPROACH;  Surgeon: Gearlean Alf, MD;  Location: [facility];  Service: Orthopedics;  Laterality: Right;     Allergies  No Known Allergies  History of Present Illness    [name] is known to this service (Dr. Stanford Breed) and has chronic Afib. She previously had symptomatic bradycardia; digoxin was discontinued and lopressor dose was decreased. She was last seen in clinic on 05/11/16. Holter monitor, used for episodic SOB, showed Afib with RVR.  At that time, lopressor was increased to 100 mg BID and diltiazem 360 mg daily was continued. She was on xarelto for her chronic Afib; however, she became anemic and xarelto was discontinued on 05/13/16.   She was recently hospitalized (03/19/16 - 03/23/16) for shortness of breath. At  that time, she was found to be anemic secondary to a gastric ulcer. GI consulted an noted to have a nonbleeding gastric ulcer and small bowel AVM. She was discharged and xarelto was restarted.  She was hospitalized a second time 05/16/16 - 05/17/16 for progressive dyspnea on exertion. She received 1U PRBC for Hb 7.4 on that admission. She was treated with iron infusion. Two hemoccults were negative and xarelto was restarted as an outpatient by GI.   Current home medications include  diltiazem (360 mg), lasix (20 mg daily), lopressor (100 mg BID), and xarelto (20 mg).   She was doing well at home, but developed fever, chills, dyspnea with a productive cough. On 05/20/16 she activated EMS for worsening dyspnea and RVR (HR in the 130s on arrival).  On arrival, she was given diltiazem 10 mg IV bolus x 2 with improvement in her heart rate. This was followed by cardizem gtt. She developed hypotension and the cardizem gtt was stopped, amiodarone IV load was started. She was also started on broad spectrum ABX for sepsis. BNP 350; troponin 0.03 x 3; CXR with vascular congestion, likely interstitial edema. She is influenza A positive and she was started on tamiflu.   In talking with the patient today, she denies any recent chest discomfort or palpitations. Breathing not yet at baseline. No lightheadedness, or presyncope. Ambulating to the restroom without difficulty.   Inpatient Medications    . benzonatate  200 mg Oral TID  . buPROPion  150 mg Oral BID WC  . cholecalciferol  1,000 Units Oral Daily  . digoxin  0.125 mg Intravenous Daily  . diltiazem  30 mg Oral Q8H  . furosemide  40 mg Intravenous Daily  . guaiFENesin  600 mg Oral BID  . insulin aspart  0-20 Units Subcutaneous TID WC  . insulin aspart  0-5 Units Subcutaneous QHS  . insulin glargine  16 Units Subcutaneous QHS  . ipratropium  0.5 mg Nebulization TID  . levalbuterol  0.63 mg Nebulization TID  . mouth rinse  15 mL Mouth Rinse BID  . methylPREDNISolone (SOLU-MEDROL) injection  40 mg Intravenous Q12H  . omega-3 acid ethyl esters  1,000 mg Oral Daily  . oseltamivir  75 mg Oral BID  . pantoprazole  40 mg Oral Daily  . piperacillin-tazobactam (ZOSYN)  IV  3.375 g Intravenous Q8H  . potassium chloride  40 mEq Oral BID  . rivaroxaban  20 mg Oral Q supper  . rosuvastatin  10 mg Oral Daily  . sodium chloride flush  10-40 mL Intracatheter Q12H  . sodium chloride flush  3 mL Intravenous Q12H     Outpatient Medications      Prior to Admission medications   Medication Sig Start Date End Date Taking? Authorizing Provider  Abatacept (ORENCIA CLICKJECT) 0000000 MG/ML SOAJ Inject 125 mg into the skin once a week.    Yes Historical Provider, MD  buPROPion (WELLBUTRIN SR) 150 MG 12 hr tablet Take 150 mg by mouth 2 (two) times daily.     Yes Historical Provider, MD  Cholecalciferol (VITAMIN D3) 1000 units CAPS Take 1,000 Units by mouth daily.   Yes Historical Provider, MD  Cyanocobalamin (VITAMIN B 12 PO) Take 1,000 mcg by mouth daily.    Yes Historical Provider, MD  diltiazem (CARDIZEM CD) 360 MG 24 hr capsule Take 1 capsule (360 mg total) by mouth daily. 05/11/16  Yes Luke K Kilroy, PA-C  esomeprazole (NEXIUM) 40 MG capsule Take 40 mg by mouth daily.  Yes Historical Provider, MD  ferrous gluconate (FERGON) 324 MG tablet Take 1 tablet (324 mg total) by mouth 2 (two) times daily with a meal. 05/17/16  Yes Debbe Odea, MD  fluticasone (FLONASE) 50 MCG/ACT nasal spray Place 1-2 sprays into both nostrils daily as needed for allergies.  04/12/15  Yes Historical Provider, MD  furosemide (LASIX) 20 MG tablet TAKE 2 TABLETS DAILY 04/05/16  Yes Lelon Perla, MD  glimepiride (AMARYL) 2 MG tablet Take 2 mg by mouth daily with breakfast.  11/03/14  Yes Historical Provider, MD  leflunomide (ARAVA) 20 MG tablet Take 20 mg by mouth daily.  04/26/13  Yes Historical Provider, MD  metFORMIN (GLUCOPHAGE-XR) 500 MG 24 hr tablet Take 1,000 mg by mouth 2 (two) times daily. 09/02/14  Yes Historical Provider, MD  metoprolol (LOPRESSOR) 50 MG tablet Take 2 tablets (100 mg total) by mouth 2 (two) times daily. 05/11/16  Yes Erlene Quan, PA-C  Multiple Vitamins-Minerals (PRESERVISION AREDS) CAPS Take 1 capsule by mouth 2 (two) times daily.   Yes Historical Provider, MD  nitroGLYCERIN (NITROSTAT) 0.4 MG SL tablet Place 0.4 mg under the tongue every 5 (five) minutes as needed for chest pain (x 3 doses).    Yes Historical Provider, MD  Omega 3 1200 MG CAPS  Take 1 capsule by mouth daily.   Yes Historical Provider, MD  potassium chloride (KLOR-CON 10) 10 MEQ tablet Take 2 tablets (20 mEq total) by mouth 2 (two) times daily. 01/04/16  Yes Lelon Perla, MD  predniSONE (DELTASONE) 5 MG tablet Take 5 mg by mouth daily.   Yes Historical Provider, MD  rivaroxaban (XARELTO) 20 MG TABS tablet Take 20 mg by mouth daily with supper.   Yes Historical Provider, MD  rosuvastatin (CRESTOR) 10 MG tablet Take 10 mg by mouth daily.    Yes Historical Provider, MD     Family History     Family History  Problem Relation Age of Onset  . Stroke Father   . Heart failure Sister   . Hypertension Sister   . Heart attack Neg Hx     Social History    Social History   Social History  . Marital status: Divorced    Spouse name: N/A  . Number of children: N/A  . Years of education: N/A   Occupational History  . Retired    Social History Main Topics  . Smoking status: Never Smoker  . Smokeless tobacco: Never Used  . Alcohol use No  . Drug use: No  . Sexual activity: Not Currently   Other Topics Concern  . Not on file   Social History Narrative  . No narrative on file     Review of Systems    General:  No chills, fever, night sweats or weight changes.  Cardiovascular:  No chest pain, edema, orthopnea, palpitations, paroxysmal nocturnal dyspnea. Positive for dyspnea on exertion   Dermatological: No rash, lesions/masses Respiratory: Positive for cough and dyspnea Urologic: No hematuria, dysuria Abdominal:   No nausea, vomiting, diarrhea, bright red blood per rectum, melena, or hematemesis Neurologic:  No visual changes, no changes in mental status. All other systems reviewed and are otherwise negative except as noted above.  Physical Exam    Blood pressure (!) 93/43, pulse (!) 118, temperature 97.8 F (36.6 C), temperature source Oral, resp. rate (!) 22, height 5\' 7"  (1.702 m), weight 175 lb 8 oz (79.6 kg), SpO2 97 %.  General: Pleasant,  elderly Caucasian female appearing in NAD  Psych: Normal affect. Neuro: Alert and oriented X 3. Moves all extremities spontaneously. HEENT: Normal  Neck: Supple without bruits or JVD. Lungs:  Resp regular, coarse sounds throughout left lung fields, clear on right Heart: Irregularly irregular, tachycardiac, no s3, s4, or murmurs. Abdomen: Soft, non-tender, non-distended, BS + x 4.  Extremities: No clubbing, cyanosis or edema. DP/PT/Radials 2+ and equal bilaterally.  Labs    Troponin [facility] Test)  Recent Labs  05/20/16 1946  TROPIPOC 0.02    Recent Labs  05/20/16 2248 05/21/16 0203 05/21/16 0925  TROPONINI 0.03* 0.03* 0.03*   Lab Results  Component Value Date   WBC 9.9 05/23/2016   HGB 8.2 (L) 05/23/2016   HCT 28.9 (L) 05/23/2016   MCV 77.5 (L) 05/23/2016   PLT 181 05/23/2016     Recent Labs Lab 05/22/16 0522 05/23/16 0516  NA 137 142  K 4.2 4.3  CL 103 106  CO2 24 28  BUN 14 17  CREATININE 0.79 0.63  CALCIUM 8.1* 8.6*  PROT 5.8*  --   BILITOT 0.8  --   ALKPHOS 99  --   ALT 27  --   AST 45*  --   GLUCOSE 272* 236*   Lab Results  Component Value Date   CHOL 156 02/22/2015   HDL 18 (L) 02/22/2015   LDLCALC 112 (H) 02/22/2015   TRIG 130 02/22/2015   Lab Results  Component Value Date   DDIMER 0.61 (H) 05/16/2016     Radiology Studies    Dg Chest 2 View  Result Date: 05/16/2016 CLINICAL DATA:  Shortness of breath for 2 weeks, chronic dry cough, history atrial fibrillation, hypertension, diabetes mellitus, pneumonia EXAM: CHEST  2 VIEW COMPARISON:  03/19/2016 FINDINGS: Enlargement of cardiac silhouette with pulmonary vascular congestion. Mediastinal contours normal. Slight chronic accentuation of perihilar markings, stable. No definite acute infiltrate, pneumothorax or acute osseous findings. Blunting of posterior costophrenic angles by tiny effusions. IMPRESSION: Enlargement of cardiac silhouette with pulmonary vascular congestion. Tiny pleural  effusions without pneumothorax. Electronically Signed   By: Lavonia Dana M.D.   On: 05/16/2016 18:15   Dg Chest Port 1 View  Result Date: 05/22/2016 CLINICAL DATA:  Pulmonary edema. EXAM: PORTABLE CHEST 1 VIEW COMPARISON:  Yesterday FINDINGS: Right upper extremity PICC with tip at the upper right atrium. Cardiomegaly. Increased generalized interstitial opacity without Kerley lines. No effusion or pneumothorax. No air bronchogram. IMPRESSION: Increased vascular congestion. Electronically Signed   By: Monte Fantasia M.D.   On: 05/22/2016 07:15   Dg Chest Port 1 View  Result Date: 05/21/2016 CLINICAL DATA:  Line placement EXAM: PORTABLE CHEST 1 VIEW COMPARISON:  Earlier the same day FINDINGS: 1816 hours. Right PICC line is new in the interval with the tip overlying the distal SVC level. The cardio pericardial silhouette is enlarged. There is pulmonary vascular congestion without overt pulmonary edema. Interstitial markings are diffusely coarsened with chronic features. Bone windows reveal no worrisome lytic or sclerotic osseous lesions. Telemetry leads overlie the chest. IMPRESSION: Right PICC line tip overlies the distal SVC level. Electronically Signed   By: Misty Stanley M.D.   On: 05/21/2016 18:35   Dg Chest Port 1 View  Result Date: 05/21/2016 CLINICAL DATA:  Shortness of breath and cough EXAM: PORTABLE CHEST 1 VIEW COMPARISON:  May 20, 2016 and May 16, 2016 FINDINGS: There remains mild cardiac enlargement with mild pulmonary venous hypertension. No frank edema or consolidation. Interstitium is prominent ; there may be slight interstitial edema. No evident  adenopathy. No bone lesions. IMPRESSION: Pulmonary vascular congestion with questionable mild interstitial edema. Essentially stable study compared to 1 day prior. No consolidation. Electronically Signed   By: Lowella Grip III M.D.   On: 05/21/2016 10:38   Dg Chest Portable 1 View  Result Date: 05/20/2016 CLINICAL DATA:  Acute  onset of shortness of breath. Initial encounter. EXAM: PORTABLE CHEST 1 VIEW COMPARISON:  Chest radiograph performed 05/16/2016 FINDINGS: The lungs are well-aerated. Vascular congestion is noted. Bibasilar airspace opacities may reflect mild interstitial edema. No pleural effusion or pneumothorax is seen. The cardiomediastinal silhouette is mildly enlarged. No acute osseous abnormalities are seen. IMPRESSION: Vascular congestion and mild cardiomegaly. Bibasilar airspace opacities may reflect mild interstitial edema. Electronically Signed   By: Garald Balding M.D.   On: 05/20/2016 19:59    ECG & Cardiac Imaging    EKG 05/20/16: Afib, ventricular rate 135 bpm   Echocardiogram 04/06/16:  Study Conclusions - Left ventricle: The cavity size was normal. There was mild   concentric hypertrophy. Systolic function was normal. The   estimated ejection fraction was in the range of 55% to 60%. Wall   motion was normal; there were no regional wall motion   abnormalities. The study is not technically sufficient to allow   evaluation of LV diastolic function. - Mitral valve: Calcified annulus. Mildly thickened leaflets .   There was mild regurgitation. - Left atrium: The atrium was mildly dilated. - Right atrium: The atrium was mildly dilated.   Assessment & Plan    1. Sepsis secondary to influenza A - per primary team   2. New onset atrial fibrillation with RVR - on Diltiazem CD 360mg  daily and Lopressor 100mg  BID prior to admission.  - RVR likely secondary to influenza A - diltiazem bolus + gtt initially controlled her rate. Diltiazem gtt was D/C'ed for hypotension and amiodarone IV load was initiated on 05/21/16 at 1453. Switched to PO Cardizem 30mg  Q8H. Lopressor was discontinued.  Diltiazem 0.125 mg IV x 1. HR remains elevated in the 130 - 140's.  - consider increasing PO diltiazem to 60 mg q6hr as pressure tolerates for better rate control - recommend restarting lopressor at 50 mg BID as  pressure tolerates - primary team has initially continued xarelto. Hb 8.2 (7.8). No active bleeding noted at this time - This patients CHA2DS2-VASc Score and unadjusted Ischemic Stroke Rate (% per year) is equal to 4.8 % stroke rate/year from a score of 4  - given her anemia and chronic Afib, may consider outpatient EP consult for [facility]  - pt is currently tolerating HR in the 130s, asymptomatic   3. Anemia - on admission, Hb was 7.8 and is 8.2 today without blood products this admission - continue to monitor for bleeding - continue xarelto for now   4. Chronic diastolic heart failure - LVEF 55-60% in 04/2016 - lasix 20 mg daily as OP - now receiving lasix 40 mg IV daily - diuresing well; 175 lbs (173 lbs on admission); overall net negative  - continue strict I/Os    Signed, Ledora Bottcher, PA-C 05/23/2016, 3:54 PM   I have seen and examined the patient along with Ledora Bottcher, PA-C.  I have reviewed the chart, notes and new data.  I agree with PA's note.  Key new complaints: feels well, no dizziness or dyspnea, relieved that her cough has improved Key examination changes: rapid irregular rhythm, a few scattered wheezes L lung base Key new findings / data: Hgb 8.2,  ECG CXR Echo reviewed  PLAN: To a large extent, her tachycardia is physiological, in response to anemia and acute infection. Multiple med changes have been made in an effort to correct her tachycardia and BP variations. Ultimately, as she gets better, we need to get back to her home rate control meds. First priority is to resume her beta blocker - I believe we are now also fighting beta blocker rebound. Will resume at 50 mg BID, with a plan to uptitrate rapidly by tomorrow if no problems with hypotension. Can continue IV amio until other meds are reestablished. I do not think dig will be useful - it takes too long to work.  Sanda Klein, MD, [place] [phone] 05/23/2016, 4:25 PM

## 2016-05-23 NOTE — Progress Notes (Signed)
Inpatient Diabetes Program Recommendations  AACE/ADA: New Consensus Statement on Inpatient Glycemic Control (2015)  Target Ranges:  Prepandial:   less than 140 mg/dL      Peak postprandial:   less than 180 mg/dL (1-2 hours)      Critically ill patients:  140 - 180 mg/dL   Lab Results  Component Value Date   GLUCAP 234 (H) 05/23/2016   HGBA1C 6.0 (H) 02/03/2016    Review of Glycemic Control Results for [name], [name] (MRN [id number]) as of 05/23/2016 10:28  Ref. Range 05/22/2016 07:54 05/22/2016 12:03 05/22/2016 16:59 05/22/2016 21:35 05/23/2016 08:37  Glucose-Capillary Latest Ref Range: 65 - 99 mg/dL 230 (H) 342 (H) 389 (H) 316 (H) 234 (H)   Diabetes history: DM2 Outpatient Diabetes medications: Amaryl 2 mg qd + Metformin 1 gm bid Current orders for Inpatient glycemic control: Novolog correction 0-20 units tid + 0-5 units hs  Inpatient Diabetes Program Recommendations:  While patient is on steroids and oral meds held,  Please consider: -Lantus 16 units ( 0.2 units x 79.6 kg)  Will follow.  Thank you, [name]. Christina Vanscyoc, RN, MSN, CDE Inpatient Glycemic Control Team Team Pager [phone] (8am-5pm) 05/23/2016 10:33 AM

## 2016-05-23 NOTE — Progress Notes (Signed)
[place] TEAM 1 - Stepdown/ICU TEAM  [name]  [id number] DOB: 1945/12/03 DOA: 05/20/2016 PCP: Tula Nakayama    Brief Narrative:  71 y.o. female with history of atrial fibrillation, iron deficiency anemia with recent transfusion, depression with anxiety, DM2, and RA who presented w/ c/o severe worsening dyspnea with fever. Patient was discharged from the hospital on 05/17/2016 following tx of symptomatic anemia. She was transfused 1 unit during that admission, had serial negative FOBT's, and was restarted on Xarelto at time of discharge. She had initially done well back at home, but developed subjective fevers and chills with dyspnea and productive cough the day prior to this admit. Dyspnea worsened and the patient activated EMS, who found a heart rate close to 200. She was given 10 mg of diltiazem 2 with improvement and was started on diltiazem infusion.   In the ED CXR noted mild cardiomegaly and pulmonary vascular congestion with bibasilar airspace opacities likely representing mild interstitial edema. CBC was notable for a stable hemoglobin of 8.5 with MCV of 74.9.   Subjective: The patient states she feels much better overall.  She denies chest pain or shortness of breath present time.  She denies abdominal pain.  She reports a good appetite.  Assessment & Plan:  Sepsis due to Influenza A - acute respiratory distress w/ bronchospasm  - CXR with bibasilar opacities felt to represent edema rather than infection - Influenza PCR positive for influenza A > on Tamiflu + IVIG study drug v/s placebo - wheezing much improved - continue to wean steroids  Chronic Atrial fibrillation with Acute RVR  - noted to be in a fib RVR by EMS and she was treated with 10 mg diltiazem IVP x2 en route  - HR remained 130's despite diltiazem infusion titrated to 15 mg/hr; rate normalized after digoxin 0.25 IV x1  - HR again poorly controlled w/ rate 140 - initiated amio gtt w/o control - loaded  w/ IV digoxin  - Xarelto, metoprolol, and diltiazem at home  - CHADS-VASc is 37 (age, gender, CHF, DM, HTN)  - HR improved, but not yet ideal - cont current measures and follow - I have consulted her Cardiologist  Acute on chronic diastolic CHF   - Acute pulmonary edema likely secondary to rapid rate - baseline weight appears to be ~80kg - TTE (04/06/16) with EF 55-60%, mild concentric hypertrophy, mild MR, and mild LAE; diastolic function could not be determined  - managed at home with Lasix 20 QD and metoprolol  - cont maintenance lasix and follow dialy weights / Is/Os  Filed Weights   05/21/16 0500 05/22/16 0300 05/23/16 0500  Weight: 78.7 kg (173 lb 8 oz) 81.4 kg (179 lb 7.3 oz) 79.6 kg (175 lb 8 oz)    Iron-deficiency anemia - Hgb 8.5 on admission with MCV 74.9  - recently hospitalized with symptomatic anemia and was transfused 1 unit; H/H remained stable - had serial negative FOBT's - Hemoglobin stable near transfusion threshold of 7.9  Rheumatoid arthritis - Managed with leflunomide and prednisone 5 mg qD at home  DM2 - recurring hypoglycemia - A1c 6.0 November 2017 - CBG remains severely elevated w/ steroid use - adjust insulin tx  Hypertension - controlled at this time  Hypokalemia - replaced - Mg is normal   Depression, anxiety  - Stable   DVT prophylaxis: xarelto  Code Status: FULL CODE Family Communication: No family present at time of exam today Disposition Plan: SDU  Consultants:  none  Procedures:  none  Antimicrobials:  Anti-infectives    Start     Dose/Rate Route Frequency Ordered Stop   05/21/16 0930  vancomycin (VANCOCIN) IVPB 1000 mg/200 mL premix  Status:  Discontinued     1,000 mg 200 mL/hr over 60 Minutes Intravenous Every 12 hours 05/20/16 2102 05/21/16 1337   05/21/16 0300  piperacillin-tazobactam (ZOSYN) IVPB 3.375 g     3.375 g 12.5 mL/hr over 240 Minutes Intravenous Every 8 hours 05/20/16 2102     05/21/16 0230  oseltamivir  (TAMIFLU) capsule 75 mg     75 mg Oral 2 times daily 05/21/16 0159 05/25/16 2159   05/20/16 2045  piperacillin-tazobactam (ZOSYN) IVPB 3.375 g     3.375 g 100 mL/hr over 30 Minutes Intravenous  Once 05/20/16 2039 05/20/16 2145   05/20/16 2045  vancomycin (VANCOCIN) IVPB 1000 mg/200 mL premix     1,000 mg 200 mL/hr over 60 Minutes Intravenous  Once 05/20/16 2039 05/20/16 2210       Objective: Blood pressure (!) 93/43, pulse (!) 118, temperature 97.8 F (36.6 C), temperature source Oral, resp. rate (!) 22, height 5\' 7"  (1.702 m), weight 79.6 kg (175 lb 8 oz), SpO2 99 %.  Intake/Output Summary (Last 24 hours) at 05/23/16 1434 Last data filed at 05/23/16 0900  Gross per 24 hour  Intake          1233.88 ml  Output             3376 ml  Net         -2142.12 ml   Filed Weights   05/21/16 0500 05/22/16 0300 05/23/16 0500  Weight: 78.7 kg (173 lb 8 oz) 81.4 kg (179 lb 7.3 oz) 79.6 kg (175 lb 8 oz)    Examination: General: No acute respiratory distress Lungs: Fine crackles throughout all fields - mild diffuse expiratory wheezing Cardiovascular: Irreg irreg - HR 110bpm - no M  Abdomen: Nontender, nondistended, soft, bowel sounds positive, no rebound Extremities: trace edema bilateral lower extremities persists  CBC:  Recent Labs Lab 05/16/16 1810 05/17/16 0806 05/20/16 1937 05/21/16 0203 05/22/16 0522 05/23/16 0516  WBC 9.0  --  9.7 8.3 9.8 9.9  NEUTROABS  --   --  8.4* 6.8  --   --   HGB 7.4* 7.7* 8.5* 8.1* 7.8* 8.2*  HCT 26.6* 26.4* 29.2* 28.6* 27.9* 28.9*  MCV 69.6*  --  74.9* 75.1* 76.4* 77.5*  PLT 316  --  235 221 191 0000000   Basic Metabolic Panel:  Recent Labs Lab 05/17/16 0806 05/20/16 1937 05/21/16 0203 05/22/16 0522 05/23/16 0516  NA 139 135 137 137 142  K 4.0 3.7 3.1* 4.2 4.3  CL 105 104 103 103 106  CO2 24 19* 22 24 28   GLUCOSE 129* 120* 54* 272* 236*  BUN 12 11 10 14 17   CREATININE 0.57 0.64 0.66 0.79 0.63  CALCIUM 8.7* 8.6* 8.4* 8.1* 8.6*  MG  --    --   --  1.9  --    GFR: Estimated Creatinine Clearance: 70.1 mL/min (by C-G formula based on SCr of 0.63 mg/dL).  Liver Function Tests:  Recent Labs Lab 05/20/16 1937 05/21/16 0203 05/22/16 0522  AST 33 26 45*  ALT 13* 11* 27  ALKPHOS 115 103 99  BILITOT 0.9 1.1 0.8  PROT 6.2* 5.8* 5.8*  ALBUMIN 3.1* 2.7* 2.7*    Cardiac Enzymes:  Recent Labs Lab 05/20/16 2248 05/21/16 0203 05/21/16 0925  TROPONINI 0.03* 0.03* 0.03*  HbA1C: Hgb A1c MFr Bld  Date/Time Value Ref Range Status  02/03/2016 09:59 AM 6.0 (H) 4.8 - 5.6 % Final    Comment:    (NOTE)         Pre-diabetes: 5.7 - 6.4         Diabetes: >6.4         Glycemic control for adults with diabetes: <7.0   02/22/2015 01:55 AM 7.2 (H) 4.8 - 5.6 % Final    Comment:    (NOTE)         Pre-diabetes: 5.7 - 6.4         Diabetes: >6.4         Glycemic control for adults with diabetes: <7.0     CBG:  Recent Labs Lab 05/22/16 1203 05/22/16 1659 05/22/16 2135 05/23/16 0837 05/23/16 1223  GLUCAP 342* 389* 316* 234* 373*    Recent Results (from the past 240 hour(s))  Blood culture (routine x 2)     Status: None (Preliminary result)   Collection Time: 05/20/16  8:45 PM  Result Value Ref Range Status   Specimen Description BLOOD RIGHT HAND  Final   Special Requests BOTTLES DRAWN AEROBIC AND ANAEROBIC 5CC  Final   Culture NO GROWTH 3 DAYS  Final   Report Status PENDING  Incomplete  Blood culture (routine x 2)     Status: None (Preliminary result)   Collection Time: 05/20/16  8:50 PM  Result Value Ref Range Status   Specimen Description BLOOD LEFT HAND  Final   Special Requests BOTTLES DRAWN AEROBIC AND ANAEROBIC 5CC  Final   Culture NO GROWTH 3 DAYS  Final   Report Status PENDING  Incomplete  MRSA PCR Screening     Status: None   Collection Time: 05/20/16 10:57 PM  Result Value Ref Range Status   MRSA by PCR NEGATIVE NEGATIVE Final    Comment:        The GeneXpert MRSA Assay (FDA approved for NASAL  specimens only), is one component of a comprehensive MRSA colonization surveillance program. It is not intended to diagnose MRSA infection nor to guide or monitor treatment for MRSA infections.   Urine culture     Status: Abnormal   Collection Time: 05/21/16  2:30 AM  Result Value Ref Range Status   Specimen Description URINE, CATHETERIZED  Final   Special Requests NONE  Final   Culture <10,000 COLONIES/mL INSIGNIFICANT GROWTH (A)  Final   Report Status 05/22/2016 FINAL  Final     Scheduled Meds: . benzonatate  200 mg Oral TID  . buPROPion  150 mg Oral BID WC  . cholecalciferol  1,000 Units Oral Daily  . digoxin  0.125 mg Intravenous Daily  . diltiazem  30 mg Oral Q8H  . furosemide  40 mg Intravenous Daily  . guaiFENesin  600 mg Oral BID  . insulin aspart  0-20 Units Subcutaneous TID WC  . insulin aspart  0-5 Units Subcutaneous QHS  . ipratropium  0.5 mg Nebulization TID  . levalbuterol  0.63 mg Nebulization TID  . mouth rinse  15 mL Mouth Rinse BID  . methylPREDNISolone (SOLU-MEDROL) injection  40 mg Intravenous Q12H  . omega-3 acid ethyl esters  1,000 mg Oral Daily  . oseltamivir  75 mg Oral BID  . pantoprazole  40 mg Oral Daily  . piperacillin-tazobactam (ZOSYN)  IV  3.375 g Intravenous Q8H  . potassium chloride  40 mEq Oral BID  . rivaroxaban  20 mg Oral Q supper  .  rosuvastatin  10 mg Oral Daily  . sodium chloride flush  10-40 mL Intracatheter Q12H  . sodium chloride flush  3 mL Intravenous Q12H   Continuous Infusions: . sodium chloride    . amiodarone 30 mg/hr (05/23/16 1229)     LOS: 3 days   Cherene Altes, MD [facility] Office  [phone] Pager - Text Page per Amion as per below:  On-Call/Text Page:      [email or web address]      password [email or web address]  If 7PM-7AM, please contact night-coverage [email or web address] Password [facility] 05/23/2016, 2:34 PM

## 2016-05-24 LAB — GLUCOSE, CAPILLARY
GLUCOSE-CAPILLARY: 167 mg/dL — AB (ref 65–99)
GLUCOSE-CAPILLARY: 269 mg/dL — AB (ref 65–99)
Glucose-Capillary: 382 mg/dL — ABNORMAL HIGH (ref 65–99)
Glucose-Capillary: 256 mg/dL — ABNORMAL HIGH (ref 65–99)

## 2016-05-24 MED ORDER — LEVALBUTEROL HCL 0.63 MG/3ML IN NEBU
0.6300 mg | INHALATION_SOLUTION | Freq: Two times a day (BID) | RESPIRATORY_TRACT | Status: DC
  Administered 2016-05-24 – 2016-05-26 (×4): 0.63 mg via RESPIRATORY_TRACT
  Filled 2016-05-24 (×4): qty 3

## 2016-05-24 MED ORDER — DILTIAZEM HCL ER COATED BEADS 180 MG PO CP24
360.0000 mg | ORAL_CAPSULE | Freq: Every day | ORAL | Status: DC
  Administered 2016-05-24 – 2016-05-26 (×3): 360 mg via ORAL
  Filled 2016-05-24 (×2): qty 2
  Filled 2016-05-24: qty 1

## 2016-05-24 MED ORDER — INSULIN ASPART 100 UNIT/ML ~~LOC~~ SOLN
4.0000 [IU] | Freq: Three times a day (TID) | SUBCUTANEOUS | Status: DC
  Administered 2016-05-24 – 2016-05-25 (×2): 4 [IU] via SUBCUTANEOUS

## 2016-05-24 MED ORDER — IPRATROPIUM BROMIDE 0.02 % IN SOLN
0.5000 mg | Freq: Two times a day (BID) | RESPIRATORY_TRACT | Status: DC
  Administered 2016-05-24 – 2016-05-26 (×4): 0.5 mg via RESPIRATORY_TRACT
  Filled 2016-05-24 (×4): qty 2.5

## 2016-05-24 NOTE — Progress Notes (Addendum)
Patient ID: [name], female   DOB: 1945-07-20, 71 y.o.   MRN: [id number]  PROGRESS NOTE    [name]  [id number] DOB: 06/07/1945 DOA: 05/20/2016  PCP: Tula Nakayama   Brief Narrative:  71 y.o.femalewith history of atrial fibrillation, iron deficiency anemia with recent transfusion, depression with anxiety, DM2, and RA who presented to ED with worsening shortness of breath and fever. She was discharged from hospital 05/17/2016 at which time she was hospitalized for symptomatic anemia. She required 1 unit of PRBC transfusion. Her fecal occult blood test was negative and she was resumed on xarelto. She had initially done well back at home, but developed subjective fevers and chills with dyspnea and productive cough the day prior to this admission. Dyspnea worsened and the patient activated EMS, who found a heart rate close to 200. She was given 10 mg of diltiazem 2 with improvement and was started on diltiazem infusion.  In the ED, CXR showed mild cardiomegaly and pulmonary vascular congestion with bibasilar airspace opacities likely representing mild interstitial edema. CBC was notable for a stable hemoglobin of 8.5 with MCV of 74.9. Her influenza PCR was positive for influenza A and she was started on Tamiflu.    Assessment & Plan:    Sepsis due to Influenza A / Acute respiratory distress w/ bronchospasm  - CXR with bibasilar opacities felt to represent edema rather than infection - Influenza PCR positive for influenza A > on Tamiflu + IVIG study drug v/s placebo - Wheezing improved - continue to wean steroids; currently on solumedrol 40 mg IV Q 12 hours - Continue xopenex and Atrovent nebulizer BID - Stop zosyn today - Blood cx negative so far   Chronic Atrial fibrillation with Acute RVR  - CHADS-VASc is 87 (age, gender, CHF, DM, HTN)  - Pt noted to be in a fib RVR by EMS and she was treated with 10 mg diltiazem IVP x2 en route  - HR remained 130's despite diltiazem  infusion titrated to 15 mg/hr; rate normalized after digoxin 0.25 IV x1  - HR again poorly controlled w/ rate 140 - initiated amio gtt w/o control - loaded w/ IV digoxin  - On 05/23/16, lopressor 50 mg BID and PO cardizem 60 mg q6hr were added for better rate control. BP stable: 130s/80s. Digoxin was discontinued. - On Cardizem 360 mg daily at this time  - Recommend discontinuing amiodarone per cardio - Per cardio, given her anemia and chronic Afib, may consider outpatient EP consult for Watchman   Acute on chronicdiastolic CHF  - Acute pulmonary edema likely secondary to rapid rate - Baseline weight appears to be ~80kg - TTE (04/06/16) with EF 55-60%, mild concentric hypertrophy, mild MR, and mild LAE; diastolic function could not be determined  - Continue lasix 40 mg IV daily - Continue daily weight and strict intake and output  Iron-deficiency anemia - Hgb 8.5 on admission with MCV 74.9  - Recently hospitalized with symptomatic anemia and was transfused 1 unit; H/H remained stable - had serial negative FOBT's - Transfuse for hgb less than 7  Rheumatoid arthritis - Managed with leflunomide and prednisone 5 mg qD at home - She is on IV steroids at this time   DM2 without complications without long term insulin use - A1c 6.0 November 2017  - At home she is on Amaryl - Currently on SSI and Lantus 16 units at bedtime  - CBG's in past 24 hours: 428, 167, 269 - Add novolog 4 units  TIDAC per DM coordinator recommendations   Hypertension, essential - controlled at this time  Hypokalemia - Due to lasix - Replaced - Follow up BMP in am  Dyslipidemia associated with type 2 DM - Continue Crestor   Depression, anxiety  - Stable  - Continue Wellbutrin   DVT prophylaxis: On xarelto  Code Status: full code  Family Communication: no family at the bedside this am Disposition Plan: anticipate D/C 2/21   Consultants:   DM coordinator   PT  Procedures:   None    Antimicrobials:   Tamiflu 05/21/2016 -->  Vanco 05/20/2016 --> 05/24/2016  Zosyn 05/21/2016 --> 05/24/2016    Subjective: Says she feels better this am.   Objective: Vitals:   05/23/16 2345 05/24/16 0542 05/24/16 0810 05/24/16 0935  BP: 133/82 (!) 137/96 (!) 129/97   Pulse: (!) 121 92 (!) 115   Resp: (!) 25 15 18    Temp: 97.7 F (36.5 C) 97 F (36.1 C) 97.8 F (36.6 C)   TempSrc: Oral Oral Oral   SpO2: 99% 98% 96% 100%  Weight:      Height:        Intake/Output Summary (Last 24 hours) at 05/24/16 1056 Last data filed at 05/24/16 0600  Gross per 24 hour  Intake           697.83 ml  Output              400 ml  Net           297.83 ml   Filed Weights   05/21/16 0500 05/22/16 0300 05/23/16 0500  Weight: 78.7 kg (173 lb 8 oz) 81.4 kg (179 lb 7.3 oz) 79.6 kg (175 lb 8 oz)    Examination:  General exam: Appears calm and comfortable  Respiratory system: Clear to auscultation. Respiratory effort normal. Cardiovascular system: S1 & S2 heard, Rate controlled  Gastrointestinal system: Abdomen is nondistended, soft and nontender. No organomegaly or masses felt. Normal bowel sounds heard. Central nervous system: Alert and oriented. No focal neurological deficits. Extremities: Symmetric 5 x 5 power. No LE swelling  Skin: No rashes, lesions or ulcers Psychiatry: Judgement and insight appear normal. Mood & affect appropriate.   Data Reviewed: I have personally reviewed following labs and imaging studies  CBC:  Recent Labs Lab 05/20/16 1937 05/21/16 0203 05/22/16 0522 05/23/16 0516  WBC 9.7 8.3 9.8 9.9  NEUTROABS 8.4* 6.8  --   --   HGB 8.5* 8.1* 7.8* 8.2*  HCT 29.2* 28.6* 27.9* 28.9*  MCV 74.9* 75.1* 76.4* 77.5*  PLT 235 221 191 0000000   Basic Metabolic Panel:  Recent Labs Lab 05/20/16 1937 05/21/16 0203 05/22/16 0522 05/23/16 0516  NA 135 137 137 142  K 3.7 3.1* 4.2 4.3  CL 104 103 103 106  CO2 19* 22 24 28   GLUCOSE 120* 54* 272* 236*  BUN 11 10 14 17    CREATININE 0.64 0.66 0.79 0.63  CALCIUM 8.6* 8.4* 8.1* 8.6*  MG  --   --  1.9  --    GFR: Estimated Creatinine Clearance: 70.1 mL/min (by C-G formula based on SCr of 0.63 mg/dL). Liver Function Tests:  Recent Labs Lab 05/20/16 1937 05/21/16 0203 05/22/16 0522  AST 33 26 45*  ALT 13* 11* 27  ALKPHOS 115 103 99  BILITOT 0.9 1.1 0.8  PROT 6.2* 5.8* 5.8*  ALBUMIN 3.1* 2.7* 2.7*   No results for input(s): LIPASE, AMYLASE in the last 168 hours. No results for input(s): AMMONIA in  the last 168 hours. Coagulation Profile: No results for input(s): INR, PROTIME in the last 168 hours. Cardiac Enzymes:  Recent Labs Lab 05/20/16 2248 05/21/16 0203 05/21/16 0925  TROPONINI 0.03* 0.03* 0.03*   BNP (last 3 results) No results for input(s): PROBNP in the last 8760 hours. HbA1C: No results for input(s): HGBA1C in the last 72 hours. CBG:  Recent Labs Lab 05/23/16 0837 05/23/16 1223 05/23/16 1559 05/23/16 2120 05/24/16 0808  GLUCAP 234* 373* 385* 428* 167*   Lipid Profile: No results for input(s): CHOL, HDL, LDLCALC, TRIG, CHOLHDL, LDLDIRECT in the last 72 hours. Thyroid Function Tests: No results for input(s): TSH, T4TOTAL, FREET4, T3FREE, THYROIDAB in the last 72 hours. Anemia Panel: No results for input(s): VITAMINB12, FOLATE, FERRITIN, TIBC, IRON, RETICCTPCT in the last 72 hours. Urine analysis:    Component Value Date/Time   COLORURINE YELLOW 05/21/2016 0230   APPEARANCEUR CLEAR 05/21/2016 0230   LABSPEC 1.021 05/21/2016 0230   PHURINE 6.0 05/21/2016 0230   GLUCOSEU NEGATIVE 05/21/2016 0230   HGBUR NEGATIVE 05/21/2016 0230   BILIRUBINUR NEGATIVE 05/21/2016 0230   KETONESUR NEGATIVE 05/21/2016 0230   PROTEINUR 30 (A) 05/21/2016 0230   UROBILINOGEN 1.0 10/09/2014 0040   NITRITE NEGATIVE 05/21/2016 0230   LEUKOCYTESUR NEGATIVE 05/21/2016 0230   Sepsis Labs: @LABRCNTIP (procalcitonin:4,lacticidven:4)   Recent Results (from the past 240 hour(s))  Blood culture  (routine x 2)     Status: None (Preliminary result)   Collection Time: 05/20/16  8:45 PM  Result Value Ref Range Status   Specimen Description BLOOD RIGHT HAND  Final   Special Requests BOTTLES DRAWN AEROBIC AND ANAEROBIC 5CC  Final   Culture NO GROWTH 4 DAYS  Final   Report Status PENDING  Incomplete  Blood culture (routine x 2)     Status: None (Preliminary result)   Collection Time: 05/20/16  8:50 PM  Result Value Ref Range Status   Specimen Description BLOOD LEFT HAND  Final   Special Requests BOTTLES DRAWN AEROBIC AND ANAEROBIC 5CC  Final   Culture NO GROWTH 4 DAYS  Final   Report Status PENDING  Incomplete  MRSA PCR Screening     Status: None   Collection Time: 05/20/16 10:57 PM  Result Value Ref Range Status   MRSA by PCR NEGATIVE NEGATIVE Final    Comment:        The GeneXpert MRSA Assay (FDA approved for NASAL specimens only), is one component of a comprehensive MRSA colonization surveillance program. It is not intended to diagnose MRSA infection nor to guide or monitor treatment for MRSA infections.   Urine culture     Status: Abnormal   Collection Time: 05/21/16  2:30 AM  Result Value Ref Range Status   Specimen Description URINE, CATHETERIZED  Final   Special Requests NONE  Final   Culture <10,000 COLONIES/mL INSIGNIFICANT GROWTH (A)  Final   Report Status 05/22/2016 FINAL  Final      Radiology Studies: Dg Chest Port 1 View Result Date: 05/22/2016  Increased vascular congestion. Electronically Signed   By: Monte Fantasia M.D.   On: 05/22/2016 07:15   Dg Chest Port 1 View Result Date: 05/21/2016 Right PICC line tip overlies the distal SVC level. Electronically Signed   By: Misty Stanley M.D.   On: 05/21/2016 18:35   Dg Chest Port 1 View Result Date: 05/21/2016 Pulmonary vascular congestion with questionable mild interstitial edema. Essentially stable study compared to 1 day prior. No consolidation. Electronically Signed   By: Lowella Grip  III M.D.   On:  05/21/2016 10:38   Dg Chest Portable 1 View Result Date: 05/20/2016 Vascular congestion and mild cardiomegaly. Bibasilar airspace opacities may reflect mild interstitial edema. Electronically Signed   By: Garald Balding M.D.   On: 05/20/2016 19:59    Scheduled Meds: . benzonatate  200 mg Oral TID  . buPROPion  150 mg Oral BID WC  . cholecalciferol  1,000 Units Oral Daily  . diltiazem  360 mg Oral Daily  . diltiazem  60 mg Oral Q6H  . furosemide  40 mg Intravenous Daily  . guaiFENesin  600 mg Oral BID  . insulin aspart  0-20 Units Subcutaneous TID WC  . insulin aspart  0-5 Units Subcutaneous QHS  . insulin glargine  16 Units Subcutaneous QHS  . ipratropium  0.5 mg Nebulization BID  . levalbuterol  0.63 mg Nebulization BID  . methylPREDNISolo  40 mg Intravenous Q12H  . metoprolol  50 mg Oral BID  . omega-3 acid ethyl  1,000 mg Oral Daily  . oseltamivir  75 mg Oral BID  . pantoprazole  40 mg Oral Daily  . piperacillin-tazobact  3.375 g Intravenous Q8H  . potassium chloride  40 mEq Oral BID  . rivaroxaban  20 mg Oral Q supper  . rosuvastatin  10 mg Oral Daily   Continuous Infusions: . sodium chloride       LOS: 4 days    Time spent: 25 minutes  Greater than 50% of the time spent on counseling and coordinating the care.   Leisa Lenz, MD [facility] Pager [phone]  If 7PM-7AM, please contact night-coverage [email or web address] Password [facility] 05/24/2016, 10:56 AM

## 2016-05-24 NOTE — Progress Notes (Signed)
Progress Note  Patient Name: [name] Date of Encounter: 05/24/2016  Primary Cardiologist: Dr. Stanford Breed  Subjective   Patient is feeling well; denies chest pain, SOB, and palpitations.   Inpatient Medications    Scheduled Meds: . benzonatate  200 mg Oral TID  . buPROPion  150 mg Oral BID WC  . cholecalciferol  1,000 Units Oral Daily  . diltiazem  60 mg Oral Q6H  . furosemide  40 mg Intravenous Daily  . guaiFENesin  600 mg Oral BID  . insulin aspart  0-20 Units Subcutaneous TID WC  . insulin aspart  0-5 Units Subcutaneous QHS  . insulin glargine  16 Units Subcutaneous QHS  . ipratropium  0.5 mg Nebulization TID  . levalbuterol  0.63 mg Nebulization TID  . mouth rinse  15 mL Mouth Rinse BID  . methylPREDNISolone (SOLU-MEDROL) injection  40 mg Intravenous Q12H  . metoprolol  50 mg Oral BID  . omega-3 acid ethyl esters  1,000 mg Oral Daily  . oseltamivir  75 mg Oral BID  . pantoprazole  40 mg Oral Daily  . piperacillin-tazobactam (ZOSYN)  IV  3.375 g Intravenous Q8H  . potassium chloride  40 mEq Oral BID  . rivaroxaban  20 mg Oral Q supper  . rosuvastatin  10 mg Oral Daily  . sodium chloride flush  10-40 mL Intracatheter Q12H  . sodium chloride flush  3 mL Intravenous Q12H   Continuous Infusions: . sodium chloride    . amiodarone 30 mg/hr (05/24/16 0027)   PRN Meds: acetaminophen **OR** acetaminophen, bisacodyl, fluticasone, HYDROcodone-acetaminophen, levalbuterol, LORazepam, metoprolol, morphine injection, ondansetron **OR** ondansetron (ZOFRAN) IV, polyethylene glycol, sodium chloride flush   Vital Signs    Vitals:   05/23/16 2003 05/23/16 2045 05/23/16 2345 05/24/16 0542  BP: (!) 132/94  133/82 (!) 137/96  Pulse: (!) 135  (!) 121 92  Resp: (!) 21  (!) 25 15  Temp: 98 F (36.7 C)  97.7 F (36.5 C) 97 F (36.1 C)  TempSrc: Oral  Oral Oral  SpO2: 98% 96% 99% 98%  Weight:      Height:        Intake/Output Summary (Last 24 hours) at 05/24/16  0755 Last data filed at 05/24/16 0600  Gross per 24 hour  Intake           697.83 ml  Output              750 ml  Net           -52.17 ml   Filed Weights   05/21/16 0500 05/22/16 0300 05/23/16 0500  Weight: 173 lb 8 oz (78.7 kg) 179 lb 7.3 oz (81.4 kg) 175 lb 8 oz (79.6 kg)     Physical Exam   General: Well developed, well nourished, female appearing in no acute distress. Head: Normocephalic, atraumatic.  Neck: Supple without bruits, JVD Lungs:  Resp regular and unlabored, breath sounds improved on left, clear on right. Heart: Irregularly irregular rhythm with regular rate, S1, S2, no murmur; no rub. Abdomen: Soft, non-tender, non-distended with normoactive bowel sounds. No hepatomegaly. No rebound/guarding. No obvious abdominal masses. Extremities: No clubbing, cyanosis, Trace B LE edema. Distal pedal pulses are 2+ bilaterally. Neuro: Alert and oriented X 3. Moves all extremities spontaneously. Psych: Normal affect.  Labs    Chemistry Recent Labs Lab 05/20/16 1937 05/21/16 0203 05/22/16 0522 05/23/16 0516  NA 135 137 137 142  K 3.7 3.1* 4.2 4.3  CL 104 103 103 106  CO2 19* 22 24 28   GLUCOSE 120* 54* 272* 236*  BUN 11 10 14 17   CREATININE 0.64 0.66 0.79 0.63  CALCIUM 8.6* 8.4* 8.1* 8.6*  PROT 6.2* 5.8* 5.8*  --   ALBUMIN 3.1* 2.7* 2.7*  --   AST 33 26 45*  --   ALT 13* 11* 27  --   ALKPHOS 115 103 99  --   BILITOT 0.9 1.1 0.8  --   GFRNONAA >60 >60 >60 >60  GFRAA >60 >60 >60 >60  ANIONGAP 12 12 10 8      Hematology Recent Labs Lab 05/21/16 0203 05/22/16 0522 05/23/16 0516  WBC 8.3 9.8 9.9  RBC 3.81* 3.65* 3.73*  HGB 8.1* 7.8* 8.2*  HCT 28.6* 27.9* 28.9*  MCV 75.1* 76.4* 77.5*  MCH 21.3* 21.4* 22.0*  MCHC 28.3* 28.0* 28.4*  RDW 25.1* 26.0* 26.4*  PLT 221 191 181    Cardiac Enzymes Recent Labs Lab 05/20/16 2248 05/21/16 0203 05/21/16 0925  TROPONINI 0.03* 0.03* 0.03*    Recent Labs Lab 05/20/16 1946  TROPIPOC 0.02     BNP Recent  Labs Lab 05/20/16 1937  BNP 349.7*     DDimer No results for input(s): DDIMER in the last 168 hours.   Radiology    No results found.   Telemetry    Rate controlled Afib in the 90-100s - Personally Reviewed  ECG    No new tracings   Cardiac Studies    Echocardiogram 04/06/16:  Study Conclusions - Left ventricle: The cavity size was normal. There was mild concentric hypertrophy. Systolic function was normal. The estimated ejection fraction was in the range of 55% to 60%. Wall motion was normal; there were no regional wall motion abnormalities. The study is not technically sufficient to allow evaluation of LV diastolic function. - Mitral valve: Calcified annulus. Mildly thickened leaflets . There was mild regurgitation. - Left atrium: The atrium was mildly dilated. - Right atrium: The atrium was mildly dilated.   Patient Profile     71 y.o. female with a PMH significant for Afib, HTN, DM, CHF, hepatitis and symptomatic bradycardia. She was brought to ED for dyspnea, productive cough in the setting of possible sepsis. She was also found to be in Afib RVR. Cardiology was consulted for management of her Afib RVR.  Assessment & Plan    1. Sepsis secondary to influenza A - per primary team   2. New onset atrial fibrillation with RVR - on Diltiazem CD 360mg  daily and Lopressor 100mg  BID prior to admission.  - RVR likely secondary to influenza A and anemia - diltiazem bolus + gtt initially controlled her rate in the ED. Diltiazem gtt was D/C'ed for hypotension and amiodarone IV load was initiated on 05/21/16 at 1453. Switched to PO Cardizem 30mg  Q8H yesterday and Lopressor was discontinued.  Diltiazem 0.125 mg IV x 1. HR remains elevated in the 130 - 140's.  - Yesterday on 05/23/16, lopressor 50 mg BID and PO cardizem 60 mg q6hr were added for better rate control. BP stable: 130s/80s. Digoxin was discontinued. - amiodarone gtt has been continued - She is now  better rate-controlled in the 90-100s and she remains asymptomatic - Recommend increasing lopressor to 75 mg BID this morning and discontinuing amiodarone - CBC not yet completed this morning; follow Hb in the presence of xarelto - This patients CHA2DS2-VASc Score and unadjusted Ischemic Stroke Rate (% per year) is equal to 4.8 % stroke rate/year from a score of 4  -  given her anemia and chronic Afib, may consider outpatient EP consult for Watchman    3. Anemia - on admission, Hb was 7.8 and is 8.2 yesterday without blood products this admission - recommend daily CBC - continue to monitor for bleeding - continue xarelto for now   4. Chronic diastolic heart failure - LVEF 55-60% in 04/2016 - lasix 20 mg daily as OP - now receiving lasix 40 mg IV daily - diuresing well; 175 lbs yesterday (173 lbs on admission); overall net negative  - continue strict I/Os    Signed, Ledora Bottcher , PA-C 7:55 AM 05/24/2016 Pager: [phone]  I have seen and examined the patient along with Ledora Bottcher , PA-C.  I have reviewed the chart, notes and new data.  I agree with PA's note.  Key new complaints: feeling much better, not complaining of dyspnea Key examination changes: irregular rhythm, 80s while asleep, <100 when awake.  PLAN: DC amiodarone. Gradually resume a regimen similar to her home meds.  Sanda Klein, MD, [place] [phone] 05/24/2016, 9:27 AM

## 2016-05-24 NOTE — Evaluation (Signed)
Physical Therapy Evaluation Patient Details Name: [name] MRN: [id number] DOB: 1945-06-12 Today's Date: 05/24/2016   History of Present Illness  71 y.o. female with a PMH significant for Afib, HTN, DM, CHF, hepatitis and symptomatic bradycardia. She was brought to ED for dyspnea, productive cough in the setting of possible sepsis. She was also found to be in Afib RVR. Cardiology was consulted for management of her Afib RVR  Clinical Impression  Patient demonstrates deficits in functional mobility as indicated below. Will need continued skilled PT to adderss deficits and maximize function. Will see as indicated and progress as tolerated. Recommend HHPT upon acute discharge.    Follow Up Recommendations Home health PT;Supervision - Intermittent    Equipment Recommendations  None recommended by PT    Recommendations for Other Services       Precautions / Restrictions Precautions Precautions: Fall Restrictions Weight Bearing Restrictions: No      Mobility  Bed Mobility Overal bed mobility: Modified Independent             General bed mobility comments: use of bed rail   Transfers Overall transfer level: Needs assistance Equipment used: None Transfers: Sit to/from Stand Sit to Stand: Min guard         General transfer comment: min guard for safety and stability, increased effort to elevate to standing from toilet   Ambulation/Gait Ambulation/Gait assistance: Min guard;Min assist Ambulation Distance (Feet): 80 Feet Assistive device: Rolling walker (2 wheeled) Gait Pattern/deviations: Step-through pattern;Decreased stride length;Drifts right/left Gait velocity: decreased Gait velocity interpretation: Below normal speed for age/gender General Gait Details: some instability noted with ambulation, min guard for safety, min  LOB noted requiring min assist to steady  Stairs            Wheelchair Mobility    Modified Rankin (Stroke Patients Only)        Balance Overall balance assessment: Needs assistance   Sitting balance-Leahy Scale: Good     Standing balance support: No upper extremity supported Standing balance-Leahy Scale: Fair Standing balance comment: able to perfrom functional tasks and reach outside BOS with min guard for safety with some instability noted                             Pertinent Vitals/Pain Pain Assessment: No/denies pain    Home Living Family/patient expects to be discharged to:: Private residence Living Arrangements: Spouse/significant other;Children   Type of Home: Mobile home Home Access: Stairs to enter Entrance Stairs-Rails: Right;Left;Can reach both Entrance Stairs-Number of Steps: 3 Home Layout: One level Home Equipment: Cane - single point;Walker - 2 wheels;Walker - 4 wheels;Bedside commode;Grab bars - tub/shower      Prior Function Level of Independence: Independent with assistive device(s)   Gait / Transfers Assistance Needed: uses cane inside house and RWs outside of home  ADL's / Homemaking Assistance Needed: indpendent with ADLs        Hand Dominance   Dominant Hand: Right    Extremity/Trunk Assessment   Upper Extremity Assessment Upper Extremity Assessment: Overall WFL for tasks assessed    Lower Extremity Assessment Lower Extremity Assessment: Overall WFL for tasks assessed       Communication   Communication: No difficulties  Cognition Arousal/Alertness: Awake/alert Behavior During Therapy: WFL for tasks assessed/performed Overall Cognitive Status: Within Functional Limits for tasks assessed  General Comments      Exercises     Assessment/Plan    PT Assessment Patient needs continued PT services  PT Problem List Decreased activity tolerance;Decreased balance;Decreased mobility       PT Treatment Interventions DME instruction;Gait training;Stair training;Functional mobility training;Therapeutic  activities;Therapeutic exercise;Balance training;Patient/family education    PT Goals (Current goals can be found in the Care Plan section)  Acute Rehab PT Goals Patient Stated Goal: to go home PT Goal Formulation: With patient Time For Goal Achievement: 06/07/16 Potential to Achieve Goals: Good    Frequency Min 3X/week   Barriers to discharge        Co-evaluation PT/OT/SLP Co-Evaluation/Treatment: Yes Reason for Co-Treatment: For patient/therapist safety;To address functional/ADL transfers PT goals addressed during session: Mobility/safety with mobility OT goals addressed during session: ADL's and self-care       End of Session   Activity Tolerance: Patient tolerated treatment well Patient left: in chair;with call bell/phone within reach Nurse Communication: Mobility status PT Visit Diagnosis: Unsteadiness on feet (R26.81)         Time: [id number] PT Time Calculation (min) (ACUTE ONLY): 24 min   Charges:   PT Evaluation $PT Eval Moderate Complexity: 1 Procedure     PT G Codes:         Duncan Dull 06/10/16, 10:57 AM Alben Deeds, PT DPT  [phone]

## 2016-05-24 NOTE — Evaluation (Signed)
Occupational Therapy Evaluation Patient Details Name: [name] MRN: [id number] DOB: Jun 25, 1945 Today's Date: 05/24/2016    History of Present Illness 71 y.o. female with a PMH significant for Afib, HTN, DM, CHF, hepatitis and symptomatic bradycardia. She was brought to ED for dyspnea, productive cough in the setting of possible sepsis. She was also found to be in Afib RVR. Cardiology was consulted for management of her Afib RVR   Clinical Impression   Pt with decreased stability during ADLs and ADL mobility. PTA, pt used cane for mobility in the house and RW when out in community. Pt was independent with ADLs, home mgt and IADLs. Pt would benefit from acute OT services to address impairments to increase level of function and safety    Follow Up Recommendations  No OT follow up;Supervision - Intermittent    Equipment Recommendations  Tub/shower seat    Recommendations for Other Services       Precautions / Restrictions Precautions Precautions: Fall Restrictions Weight Bearing Restrictions: No      Mobility Bed Mobility Overal bed mobility: Modified Independent             General bed mobility comments: use of bed rail   Transfers Overall transfer level: Needs assistance Equipment used: None;Rolling walker (2 wheeled) Transfers: Sit to/from Stand Sit to Stand: Min guard         General transfer comment: min guard for safety and stability, increased effort to elevate to standing from toilet     Balance Overall balance assessment: Needs assistance   Sitting balance-Leahy Scale: Good     Standing balance support: No upper extremity supported;During functional activity Standing balance-Leahy Scale: Fair Standing balance comment: able to perfrom functional tasks and reach outside BOS with min guard for safety with some instability noted                            ADL Overall ADL's : Needs assistance/impaired     Grooming: Wash/dry  hands;Wash/dry face;Standing;Min guard   Upper Body Bathing: Set up;Sitting   Lower Body Bathing: Sit to/from stand;Min guard   Upper Body Dressing : Set up;Sitting   Lower Body Dressing: Min guard;Sit to/from stand   Toilet Transfer: Min guard;Ambulation;Comfort height toilet;Grab bars   Toileting- Clothing Manipulation and Hygiene: Min guard;Sit to/from stand   Tub/ Shower Transfer: Min guard;Grab bars;Ambulation   Functional mobility during ADLs: Min guard General ADL Comments: able to perfrom functional tasks and reach outside BOS with min guard for safety with some instability noted     Vision   Vision Assessment?: No apparent visual deficits                Pertinent Vitals/Pain Pain Assessment: No/denies pain     Hand Dominance Right   Extremity/Trunk Assessment Upper Extremity Assessment Upper Extremity Assessment: Overall WFL for tasks assessed   Lower Extremity Assessment Lower Extremity Assessment: Defer to PT evaluation   Cervical / Trunk Assessment Cervical / Trunk Assessment: Normal   Communication Communication Communication: No difficulties   Cognition Arousal/Alertness: Awake/alert Behavior During Therapy: WFL for tasks assessed/performed Overall Cognitive Status: Within Functional Limits for tasks assessed                     General Comments   pt pleasant and cooperative                 Home Living Family/patient expects to be discharged to::  Private residence Living Arrangements: Spouse/significant other;Children Available Help at Discharge: Family Type of Home: Mobile home Home Access: Stairs to enter Technical brewer of Steps: 3 Entrance Stairs-Rails: Right;Left;Can reach both [place]: One level     Bathroom Shower/Tub: Teacher, early years/pre: Handicapped height     Home Equipment: [place] - single point;Walker - 2 wheels;Walker - 4 wheels;Bedside commode;Grab bars - tub/shower           Prior Functioning/Environment Level of Independence: Independent with assistive device(s)  Gait / Transfers Assistance Needed: uses cane inside house and RWs outside of home ADL's / Homemaking Assistance Needed: indpendent with ADLs, cooking, cleaning, grocery shopping; driving            OT Problem List: Impaired balance (sitting and/or standing);Decreased activity tolerance      OT Treatment/Interventions: Self-care/ADL training;Therapeutic exercise;Patient/family education;DME and/or AE instruction;Therapeutic activities    OT Goals(Current goals can be found in the care plan section) Acute Rehab OT Goals Patient Stated Goal: to go home OT Goal Formulation: With patient Time For Goal Achievement: 05/31/16 Potential to Achieve Goals: Good ADL Goals Pt Will Perform Grooming: with supervision;with set-up;standing Pt Will Perform Lower Body Bathing: with supervision;with set-up;sit to/from stand Pt Will Perform Lower Body Dressing: with set-up;with supervision;sit to/from stand Pt Will Transfer to Toilet: with supervision;ambulating;regular height toilet;grab bars Pt Will Perform Toileting - Clothing Manipulation and hygiene: with supervision;with modified independence;sit to/from stand Pt Will Perform Tub/Shower Transfer: with supervision;with modified independence;ambulating Additional ADL Goal #1: Pt will safely retrieve ADL/toiletry items from drawers on prep for selfcare  OT Frequency: Min 2X/week   Barriers to D/C:    no barriers       Co-evaluation PT/OT/SLP Co-Evaluation/Treatment: Yes Reason for Co-Treatment: For patient/therapist safety;To address functional/ADL transfers PT goals addressed during session: Mobility/safety with mobility OT goals addressed during session: ADL's and self-care;Proper use of Adaptive equipment and DME      End of Session Equipment Utilized During Treatment: Rolling walker  Activity Tolerance: Patient tolerated treatment well Patient  left: in chair;with call bell/phone within reach                   ADL either performed or assessed with clinical judgement  Time: 1030-1054 OT Time Calculation (min): 24 min Charges:  OT General Charges $OT Visit: 1 Procedure OT Evaluation $OT Eval Low Complexity: 1 Procedure G-Codes: OT G-codes **NOT FOR INPATIENT CLASS** Functional Assessment Tool Used: AM-PAC 6 Clicks Daily Activity     Britt Bottom 05/24/2016, 12:59 PM

## 2016-05-24 NOTE — Progress Notes (Signed)
Inpatient Diabetes Program Recommendations  AACE/ADA: New Consensus Statement on Inpatient Glycemic Control (2015)  Target Ranges:  Prepandial:   less than 140 mg/dL      Peak postprandial:   less than 180 mg/dL (1-2 hours)      Critically ill patients:  140 - 180 mg/dL   Lab Results  Component Value Date   GLUCAP 167 (H) 05/24/2016   HGBA1C 6.0 (H) 02/03/2016    Review of Glycemic Control Results for [name], [name] (MRN [id number]) as of 05/24/2016 11:02  Ref. Range 05/23/2016 08:37 05/23/2016 12:23 05/23/2016 15:59 05/23/2016 21:20 05/24/2016 08:08  Glucose-Capillary Latest Ref Range: 65 - 99 mg/dL 234 (H) 373 (H) 385 (H) 428 (H) 167 (H)   Diabetes history: DM2 Outpatient Diabetes medications: Amaryl 2 mg qd + Metformin 1 gm bid Current orders for Inpatient glycemic control: Lantus 16 units + Novolog correction 0-20 units tid + 0-5 units hs  Inpatient Diabetes Program Recommendations:  Fasting CBG good on Lantus. Please consider adding Novolog 4 units meal coverage tid if eats 50% meals.  Thank you, Nani Gasser. Venola Castello, RN, MSN, CDE Inpatient Glycemic Control Team Team Pager [phone] (8am-5pm) 05/24/2016 11:07 AM

## 2016-05-25 ENCOUNTER — Ambulatory Visit: Payer: Medicare Other | Admitting: Cardiology

## 2016-05-25 DIAGNOSIS — A419 Sepsis, unspecified organism: Principal | ICD-10-CM

## 2016-05-25 LAB — BASIC METABOLIC PANEL
ANION GAP: 8 (ref 5–15)
BUN: 20 mg/dL (ref 6–20)
CO2: 29 mmol/L (ref 22–32)
Calcium: 9 mg/dL (ref 8.9–10.3)
Chloride: 103 mmol/L (ref 101–111)
Creatinine, Ser: 0.68 mg/dL (ref 0.44–1.00)
GFR calc Af Amer: >60 mL/min (ref >60)
Glucose, Bld: 176 mg/dL — ABNORMAL HIGH (ref 65–99)
POTASSIUM: 4.4 mmol/L (ref 3.5–5.1)
Sodium: 140 mmol/L (ref 135–145)

## 2016-05-25 LAB — CBC
HEMATOCRIT: 31.4 % — AB (ref 36.0–46.0)
Hemoglobin: 8.6 g/dL — ABNORMAL LOW (ref 12.0–15.0)
MCH: 21.3 pg — AB (ref 26.0–34.0)
MCHC: 27.4 g/dL — ABNORMAL LOW (ref 30.0–36.0)
MCV: 77.7 fL — AB (ref 78.0–100.0)
PLATELETS: 196 10*3/uL (ref 150–400)
RBC: 4.04 MIL/uL (ref 3.87–5.11)
RDW: 27.1 % — AB (ref 11.5–15.5)
WBC: 10.1 10*3/uL (ref 4.0–10.5)

## 2016-05-25 LAB — GLUCOSE, CAPILLARY
Glucose-Capillary: 162 mg/dL — ABNORMAL HIGH (ref 65–99)
Glucose-Capillary: 196 mg/dL — ABNORMAL HIGH (ref 65–99)
Glucose-Capillary: 405 mg/dL — ABNORMAL HIGH (ref 65–99)
Glucose-Capillary: 340 mg/dL — ABNORMAL HIGH (ref 65–99)

## 2016-05-25 LAB — CULTURE, BLOOD (ROUTINE X 2)
CULTURE: NO GROWTH
Culture: NO GROWTH

## 2016-05-25 MED ORDER — METOPROLOL TARTRATE 50 MG PO TABS: 50.0000 mg | ORAL_TABLET | Freq: Once | ORAL | Status: DC

## 2016-05-25 MED ORDER — INSULIN ASPART 100 UNIT/ML ~~LOC~~ SOLN
4.0000 [IU] | Freq: Three times a day (TID) | SUBCUTANEOUS | Status: DC
  Administered 2016-05-25 (×2): 4 [IU] via SUBCUTANEOUS

## 2016-05-25 MED ORDER — INSULIN ASPART 100 UNIT/ML ~~LOC~~ SOLN
5.0000 [IU] | Freq: Three times a day (TID) | SUBCUTANEOUS | Status: DC
  Administered 2016-05-26 (×2): 5 [IU] via SUBCUTANEOUS

## 2016-05-25 MED ORDER — PREDNISONE 20 MG PO TABS
50.0000 mg | ORAL_TABLET | Freq: Every day | ORAL | Status: DC
  Administered 2016-05-25 – 2016-05-26 (×2): 50 mg via ORAL
  Filled 2016-05-25 (×2): qty 2

## 2016-05-25 MED ORDER — METOPROLOL TARTRATE 100 MG PO TABS
100.0000 mg | ORAL_TABLET | Freq: Two times a day (BID) | ORAL | Status: DC
  Administered 2016-05-25 – 2016-05-26 (×2): 100 mg via ORAL
  Filled 2016-05-25 (×2): qty 1

## 2016-05-25 NOTE — Progress Notes (Signed)
Progress Note  Patient Name: [name] Date of Encounter: 05/25/2016  Primary Cardiologist: Dr. Stanford Breed  Subjective   Patient is feeling well; denies chest pain, SOB, and palpitations. She states her LE edema is at baseline today.   Inpatient Medications    Scheduled Meds: . benzonatate  200 mg Oral TID  . buPROPion  150 mg Oral BID WC  . cholecalciferol  1,000 Units Oral Daily  . diltiazem  360 mg Oral Daily  . furosemide  40 mg Intravenous Daily  . guaiFENesin  600 mg Oral BID  . insulin aspart  0-20 Units Subcutaneous TID WC  . insulin aspart  0-5 Units Subcutaneous QHS  . insulin aspart  4 Units Subcutaneous TID WC  . insulin glargine  16 Units Subcutaneous QHS  . ipratropium  0.5 mg Nebulization BID  . levalbuterol  0.63 mg Nebulization BID  . mouth rinse  15 mL Mouth Rinse BID  . metoprolol  50 mg Oral BID  . omega-3 acid ethyl esters  1,000 mg Oral Daily  . pantoprazole  40 mg Oral Daily  . potassium chloride  40 mEq Oral BID  . predniSONE  50 mg Oral Q breakfast  . rivaroxaban  20 mg Oral Q supper  . rosuvastatin  10 mg Oral Daily  . sodium chloride flush  10-40 mL Intracatheter Q12H  . sodium chloride flush  3 mL Intravenous Q12H   Continuous Infusions: . sodium chloride     PRN Meds: acetaminophen **OR** acetaminophen, bisacodyl, fluticasone, HYDROcodone-acetaminophen, levalbuterol, LORazepam, metoprolol, morphine injection, ondansetron **OR** ondansetron (ZOFRAN) IV, polyethylene glycol, sodium chloride flush   Vital Signs    Vitals:   05/24/16 2024 05/25/16 0451 05/25/16 0456 05/25/16 0957  BP: 134/76 (!) 144/92  (!) 142/92  Pulse: 93 83  89  Resp: 18 18    Temp: 98 F (36.7 C) 97.6 F (36.4 C)    TempSrc: Oral Oral    SpO2: 98% 98%    Weight:   176 lb 1.6 oz (79.9 kg)   Height:        Intake/Output Summary (Last 24 hours) at 05/25/16 1135 Last data filed at 05/24/16 1630  Gross per 24 hour  Intake                0 ml  Output               650 ml  Net             -650 ml   Filed Weights   05/22/16 0300 05/23/16 0500 05/25/16 0456  Weight: 179 lb 7.3 oz (81.4 kg) 175 lb 8 oz (79.6 kg) 176 lb 1.6 oz (79.9 kg)     Physical Exam   General: Well developed, well nourished, female appearing in no acute distress. Head: Normocephalic, atraumatic.  Neck: Supple without bruits, No JVD Lungs:  Resp regular and unlabored, CTA. Heart: Irregularly irregular rhythm, regular rate, S1, S2, no S3, S4, or murmur; no rub. Abdomen: Soft, non-tender, non-distended with normoactive bowel sounds. No hepatomegaly. No rebound/guarding. No obvious abdominal masses. Extremities: No clubbing, cyanosis, Trace to 1+ B LE edema. Distal pedal pulses are 1+ bilaterally. Neuro: Alert and oriented X 3. Moves all extremities spontaneously. Psych: Normal affect.  Labs    Chemistry Recent Labs Lab 05/20/16 1937 05/21/16 0203 05/22/16 0522 05/23/16 0516 05/25/16 0434  NA 135 137 137 142 140  K 3.7 3.1* 4.2 4.3 4.4  CL 104 103 103 106 103  CO2 19* 22 24 28 29   GLUCOSE 120* 54* 272* 236* 176*  BUN 11 10 14 17 20   CREATININE 0.64 0.66 0.79 0.63 0.68  CALCIUM 8.6* 8.4* 8.1* 8.6* 9.0  PROT 6.2* 5.8* 5.8*  --   --   ALBUMIN 3.1* 2.7* 2.7*  --   --   AST 33 26 45*  --   --   ALT 13* 11* 27  --   --   ALKPHOS 115 103 99  --   --   BILITOT 0.9 1.1 0.8  --   --   GFRNONAA >60 >60 >60 >60 >60  GFRAA >60 >60 >60 >60 >60  ANIONGAP 12 12 10 8 8      Hematology Recent Labs Lab 05/22/16 0522 05/23/16 0516 05/25/16 0434  WBC 9.8 9.9 10.1  RBC 3.65* 3.73* 4.04  HGB 7.8* 8.2* 8.6*  HCT 27.9* 28.9* 31.4*  MCV 76.4* 77.5* 77.7*  MCH 21.4* 22.0* 21.3*  MCHC 28.0* 28.4* 27.4*  RDW 26.0* 26.4* 27.1*  PLT 191 181 196    Cardiac Enzymes Recent Labs Lab 05/20/16 2248 05/21/16 0203 05/21/16 0925  TROPONINI 0.03* 0.03* 0.03*    Recent Labs Lab 05/20/16 1946  TROPIPOC 0.02     BNP Recent Labs Lab 05/20/16 1937  BNP 349.7*      DDimer No results for input(s): DDIMER in the last 168 hours.   Radiology    No results found.   Telemetry    Rate controlled Afib - Personally Reviewed  ECG    No new tracings   Cardiac Studies   Echocardiogram 04/06/16:  Study Conclusions - Left ventricle: The cavity size was normal. There was mild concentric hypertrophy. Systolic function was normal. The estimated ejection fraction was in the range of 55% to 60%. Wall motion was normal; there were no regional wall motion abnormalities. The study is not technically sufficient to allow evaluation of LV diastolic function. - Mitral valve: Calcified annulus. Mildly thickened leaflets . There was mild regurgitation. - Left atrium: The atrium was mildly dilated. - Right atrium: The atrium was mildly dilated.   Patient Profile     71 y.o. female with a PMH significant for permanent Afib, HTN, DM, CHF, hepatitis and symptomatic bradycardia. She was brought to ED for dyspnea, productive cough in the setting of possible sepsis. She was also found to be in Afib RVR. Cardiology was consulted for management of her Afib RVR.  Assessment & Plan    1. Sepsis secondary to influenza A - per primary team   2. Atrial fibrillation with rVR - on Diltiazem CD 360mg  daily and Lopressor 100mg  BID prior to admission.  - RVR likely secondary to influenza A and anemia - diltiazem bolus + gtt initially controlled her rate in the ED. Diltiazem gtt was D/C'ed for hypotension and amiodarone IV load was initiated on 05/21/16 at 1453. Switched to PO Cardizem 30mg  Q8H yesterday and Lopressor was discontinued. Digoxin 0.125 mg IV x 1. HR remained elevated in the 130 - 140's.  - on 05/23/16, lopressor 50 mg BID and PO cardizem 60 mg q6hr were added for better rate control. sBP stable: 120-140s.  Digoxin was discontinued. - amiodarone gtt discontinued 05/24/16 - She is now better rate-controlled in the 90-100s and she remains  asymptomatic - 05/25/16 increased lopressor to home dose of 100 mg BID; continue cardizem 60 mg q6hr - This patients CHA2DS2-VASc Score and unadjusted Ischemic Stroke Rate (% per year) is equal to 4.8 % stroke  rate/year from a score of 4 - given her anemia and chronic Afib, may consider outpatient EP consult for Watchman  - OK for discharge, discharge on her home on her home medication regiemn - will be called with a follow up appt with our office  3. Anemia - on admission, Hb was 7.8 and is 8.6 today without blood products this admission - recommend daily CBC - continue to monitor for bleeding - continue xarelto for now   4. Chronic diastolic heart failure - LVEF 55-60% in 04/2016 - lasix 20 mg daily as OP - now receiving lasix 40 mg IV daily - continue diuresis, sCr stable - 176 lbs today (173 lbs on admission), but net negative 8L  - she states her LE edema is near baseline - continue strict I/Os     Signed, Ledora Bottcher , PA-C 11:35 AM 05/25/2016 Pager: [phone]  I have seen and examined the patient along with Ledora Bottcher , PA-C.  I have reviewed the chart, notes and new data.  I agree with PAs note.   PLAN: Ultimately, needs to be back on her usual rate control meds. Resolving infection and anemia will keep her heart rate a little faster, but this should gradually improve. Note weight 177-182 throughout 2017 and early 2018. Probably at baseline volume status.  Sanda Klein, MD, [place] [phone] 05/25/2016, 1:16 PM

## 2016-05-25 NOTE — Progress Notes (Addendum)
Patient ID: [name], female   DOB: 08-07-1945, 71 y.o.   MRN: [id number]  PROGRESS NOTE    [name]  [id number] DOB: Jun 19, 1945 DOA: 05/20/2016  PCP: Tula Nakayama   Brief Narrative:  71 y.o.femalewith history of atrial fibrillation, iron deficiency anemia with recent transfusion, depression with anxiety, DM2, and RA who presented to ED with worsening shortness of breath and fever. She was discharged from hospital 05/17/2016 at which time she was hospitalized for symptomatic anemia. She required 1 unit of PRBC transfusion. Her fecal occult blood test was negative and she was resumed on xarelto. She had initially done well back at home, but developed subjective fevers and chills with dyspnea and productive cough the day prior to this admission. Dyspnea worsened and the patient activated EMS, who found a heart rate close to 200. She was given 10 mg of diltiazem 2 with improvement and was started on diltiazem infusion.  In the ED, CXR showed mild cardiomegaly and pulmonary vascular congestion with bibasilar airspace opacities likely representing mild interstitial edema. CBC was notable for a stable hemoglobin of 8.5 with MCV of 74.9. Her influenza PCR was positive for influenza A and she was started on Tamiflu.    Assessment & Plan:    Sepsis due to Influenza A / Acute respiratory distress w/ bronchospasm  - CXR with bibasilar opacities felt to represent edema rather than infection - Influenza PCR positive for influenza A > on Tamiflu + IVIG study drug v/s placebo - Resp status stable - Stopped solumedrol and started prednisone 50 mg a day from today  - Continue xopenex and Atrovent nebulizer BID - Zosyn stopped 2/20 - Blood cx negative so far   Chronic Atrial fibrillation with Acute RVR  - CHADS-VASc is 21 (age, gender, CHF, DM, HTN)  - Pt noted to be in a fib RVR by EMS and she was treated with 10 mg diltiazem IVP x2 en route. HR remained 130's despite diltiazem  infusion titrated to 15 mg/hr; rate normalized after digoxin 0.25 IV x1. HR again poorly controlled w/ rate 140 - initiated amio gtt w/o control - loaded w/ IV digoxin. Subsequently digoxin and amiodarone stopped per cardio - Pt is currently on Cardizem 360 mg daily, metoprolol 100 mg PO BID  Acute on chronicdiastolic CHF  - Acute pulmonary edema likely secondary to rapid rate - Baseline weight appears to be ~80kg - TTE (04/06/16) with EF 55-60%, mild concentric hypertrophy, mild MR, and mild LAE; diastolic function could not be determined  - Continue lasix 40 mg IV daily - Continue daily weight and strict intake and output  Iron-deficiency anemia - Hgb 8.5 on admission with MCV 74.9  - Recently hospitalized with symptomatic anemia and was transfused 1 unit; H/H remained stable - had serial negative FOBT's - Transfuse for hgb less than 7  Rheumatoid arthritis - Managed with leflunomide and prednisone 5 mg qD at home - Stopped solumedrol and, currently on prednisone 50 mg a day   DM2 without complications without long term insulin use - A1c 6.0 November 2017  - At home she is on Amaryl - Currently Lantus 16 units at bedtime, novolog 4 units TIDAC and SSI - CBG's in past 24 hours: 162-250 - Will increase novolog to 5 units TID   Hypertension, essential - Continue metoprolol and Cardizem  - Continue lasix   Hypokalemia - Due to lasix - Replaced and WNL  Dyslipidemia associated with type 2 DM - Continue Crestor and omega 3  supplementation   Depression, anxiety  - Stable  - Continue Wellbutrin   DVT prophylaxis: On xarelto  Code Status: full code  Family Communication: no family at the bedside this am Disposition Plan: D/C once cleared by cardio    Consultants:   DM coordinator   PT  Procedures:   None   Antimicrobials:   Tamiflu 05/21/2016 -->  Vanco 05/20/2016 --> 05/24/2016  Zosyn 05/21/2016 --> 05/24/2016    Subjective: No overnight events.    Objective: Vitals:   05/25/16 0451 05/25/16 0456 05/25/16 0957 05/25/16 1500  BP: (!) 144/92  (!) 142/92 124/64  Pulse: 83  89 68  Resp: 18   18  Temp: 97.6 F (36.4 C)   98.4 F (36.9 C)  TempSrc: Oral   Oral  SpO2: 98%   99%  Weight:  79.9 kg (176 lb 1.6 oz)    Height:        Intake/Output Summary (Last 24 hours) at 05/25/16 1633 Last data filed at 05/25/16 1300  Gross per 24 hour  Intake              480 ml  Output                0 ml  Net              480 ml   Filed Weights   05/22/16 0300 05/23/16 0500 05/25/16 0456  Weight: 81.4 kg (179 lb 7.3 oz) 79.6 kg (175 lb 8 oz) 79.9 kg (176 lb 1.6 oz)    Examination:  General exam: Appears calm and comfortable  Respiratory system: Clear to auscultation. Respiratory effort normal. Cardiovascular system: S1 & S2 heard, irregular rhythm Gastrointestinal system: (+) BS, non tender Central nervous system: Nonfocal Extremities: trace LE edema Skin: Skin is warm and dry  Psychiatry: Mood & affect appropriate.   Data Reviewed: I have personally reviewed following labs and imaging studies  CBC:  Recent Labs Lab 05/20/16 1937 05/21/16 0203 05/22/16 0522 05/23/16 0516 05/25/16 0434  WBC 9.7 8.3 9.8 9.9 10.1  NEUTROABS 8.4* 6.8  --   --   --   HGB 8.5* 8.1* 7.8* 8.2* 8.6*  HCT 29.2* 28.6* 27.9* 28.9* 31.4*  MCV 74.9* 75.1* 76.4* 77.5* 77.7*  PLT 235 221 191 181 [id number]   Basic Metabolic Panel:  Recent Labs Lab 05/20/16 1937 05/21/16 0203 05/22/16 0522 05/23/16 0516 05/25/16 0434  NA 135 137 137 142 140  K 3.7 3.1* 4.2 4.3 4.4  CL 104 103 103 106 103  CO2 19* 22 24 28 29   GLUCOSE 120* 54* 272* 236* 176*  BUN 11 10 14 17 20   CREATININE 0.64 0.66 0.79 0.63 0.68  CALCIUM 8.6* 8.4* 8.1* 8.6* 9.0  MG  --   --  1.9  --   --    GFR: Estimated Creatinine Clearance: 70.2 mL/min (by C-G formula based on SCr of 0.68 mg/dL). Liver Function Tests:  Recent Labs Lab 05/20/16 1937 05/21/16 0203 05/22/16 0522  AST  33 26 45*  ALT 13* 11* 27  ALKPHOS 115 103 99  BILITOT 0.9 1.1 0.8  PROT 6.2* 5.8* 5.8*  ALBUMIN 3.1* 2.7* 2.7*   No results for input(s): LIPASE, AMYLASE in the last 168 hours. No results for input(s): AMMONIA in the last 168 hours. Coagulation Profile: No results for input(s): INR, PROTIME in the last 168 hours. Cardiac Enzymes:  Recent Labs Lab 05/20/16 2248 05/21/16 0203 05/21/16 0925  TROPONINI 0.03* 0.03* 0.03*  BNP (last 3 results) No results for input(s): PROBNP in the last 8760 hours. HbA1C: No results for input(s): HGBA1C in the last 72 hours. CBG:  Recent Labs Lab 05/24/16 1200 05/24/16 1630 05/24/16 2142 05/25/16 0609 05/25/16 1119  GLUCAP 269* 382* 256* 162* 196*   Lipid Profile: No results for input(s): CHOL, HDL, LDLCALC, TRIG, CHOLHDL, LDLDIRECT in the last 72 hours. Thyroid Function Tests: No results for input(s): TSH, T4TOTAL, FREET4, T3FREE, THYROIDAB in the last 72 hours. Anemia Panel: No results for input(s): VITAMINB12, FOLATE, FERRITIN, TIBC, IRON, RETICCTPCT in the last 72 hours. Urine analysis:    Component Value Date/Time   COLORURINE YELLOW 05/21/2016 0230   APPEARANCEUR CLEAR 05/21/2016 0230   LABSPEC 1.021 05/21/2016 0230   PHURINE 6.0 05/21/2016 0230   GLUCOSEU NEGATIVE 05/21/2016 0230   HGBUR NEGATIVE 05/21/2016 0230   BILIRUBINUR NEGATIVE 05/21/2016 0230   KETONESUR NEGATIVE 05/21/2016 0230   PROTEINUR 30 (A) 05/21/2016 0230   UROBILINOGEN 1.0 10/09/2014 0040   NITRITE NEGATIVE 05/21/2016 0230   LEUKOCYTESUR NEGATIVE 05/21/2016 0230   Sepsis Labs: @LABRCNTIP (procalcitonin:4,lacticidven:4)   Recent Results (from the past 240 hour(s))  Blood culture (routine x 2)     Status: None   Collection Time: 05/20/16  8:45 PM  Result Value Ref Range Status   Specimen Description BLOOD RIGHT HAND  Final   Special Requests BOTTLES DRAWN AEROBIC AND ANAEROBIC 5CC  Final   Culture NO GROWTH 5 DAYS  Final   Report Status 05/25/2016  FINAL  Final  Blood culture (routine x 2)     Status: None   Collection Time: 05/20/16  8:50 PM  Result Value Ref Range Status   Specimen Description BLOOD LEFT HAND  Final   Special Requests BOTTLES DRAWN AEROBIC AND ANAEROBIC 5CC  Final   Culture NO GROWTH 5 DAYS  Final   Report Status 05/25/2016 FINAL  Final  MRSA PCR Screening     Status: None   Collection Time: 05/20/16 10:57 PM  Result Value Ref Range Status   MRSA by PCR NEGATIVE NEGATIVE Final    Comment:        The GeneXpert MRSA Assay (FDA approved for NASAL specimens only), is one component of a comprehensive MRSA colonization surveillance program. It is not intended to diagnose MRSA infection nor to guide or monitor treatment for MRSA infections.   Urine culture     Status: Abnormal   Collection Time: 05/21/16  2:30 AM  Result Value Ref Range Status   Specimen Description URINE, CATHETERIZED  Final   Special Requests NONE  Final   Culture <10,000 COLONIES/mL INSIGNIFICANT GROWTH (A)  Final   Report Status 05/22/2016 FINAL  Final      Radiology Studies: Dg Chest Port 1 View Result Date: 05/22/2016  Increased vascular congestion. Electronically Signed   By: Monte Fantasia M.D.   On: 05/22/2016 07:15   Dg Chest Port 1 View Result Date: 05/21/2016 Right PICC line tip overlies the distal SVC level. Electronically Signed   By: Misty Stanley M.D.   On: 05/21/2016 18:35   Dg Chest Port 1 View Result Date: 05/21/2016 Pulmonary vascular congestion with questionable mild interstitial edema. Essentially stable study compared to 1 day prior. No consolidation. Electronically Signed   By: Lowella Grip III M.D.   On: 05/21/2016 10:38   Dg Chest Portable 1 View Result Date: 05/20/2016 Vascular congestion and mild cardiomegaly. Bibasilar airspace opacities may reflect mild interstitial edema. Electronically Signed   By: Garald Balding  M.D.   On: 05/20/2016 19:59    Scheduled Meds: . benzonatate  200 mg Oral TID  .  buPROPion  150 mg Oral BID WC  . cholecalciferol  1,000 Units Oral Daily  . diltiazem  360 mg Oral Daily  . diltiazem  60 mg Oral Q6H  . furosemide  40 mg Intravenous Daily  . guaiFENesin  600 mg Oral BID  . insulin aspart  0-20 Units Subcutaneous TID WC  . insulin aspart  0-5 Units Subcutaneous QHS  . insulin glargine  16 Units Subcutaneous QHS  . ipratropium  0.5 mg Nebulization BID  . levalbuterol  0.63 mg Nebulization BID  . methylPREDNISolo  40 mg Intravenous Q12H  . metoprolol  50 mg Oral BID  . omega-3 acid ethyl  1,000 mg Oral Daily  . oseltamivir  75 mg Oral BID  . pantoprazole  40 mg Oral Daily  . piperacillin-tazobact  3.375 g Intravenous Q8H  . potassium chloride  40 mEq Oral BID  . rivaroxaban  20 mg Oral Q supper  . rosuvastatin  10 mg Oral Daily   Continuous Infusions: . sodium chloride       LOS: 5 days    Time spent: 25 minutes  Greater than 50% of the time spent on counseling and coordinating the care.   Leisa Lenz, MD [facility] Pager [phone]  If 7PM-7AM, please contact night-coverage [email or web address] Password [facility] 05/25/2016, 4:33 PM

## 2016-05-25 NOTE — Care Management Note (Addendum)
Case Management Note Marvetta Gibbons RN, BSN Unit 2W-Case Manager [phone]  Patient Details  Name: [name] MRN: [id number] Date of Birth: 1945-07-20  Subjective/Objective:   Pt admitted with sepsis- CAP/FLU                 Action/Plan: PTA pt lived at home, plan to go to [facility] home at discharge address: [address] [place] [address] phone# [phone]- orders have been placed for [facility] services and DME- tub bench- spoke with pt at bedside- per pt she has cane, walkers and BSC at home. She would like a tub bench however does not want to pay out of pocket for it- and insurance does not cover- declined DME at this time. Choice offered for [facility] agency- per pt she has used [place] (now known [facility]) in the past would like to use them again- referral called to [place] with [facility]- for HHRN/PT/OT/aide- referral has been accepted.    Expected Discharge Date:                  Expected Discharge Plan:  [place]  In-House Referral:     Discharge planning Services  CM Consult  Post Acute Care Choice:  Home Health, Durable Medical Equipment Choice offered to:  Patient  DME Arranged:  Tub bench, Patient refused services DME Agency:     HH Arranged:  RN, PT, OT, Nurse's Aide [place] Agency:  [facility] ([facility]  Status of Service:  Completed, signed off  If discussed at [employer] of Stay Meetings, dates discussed:    Discharge Disposition: home with home health  Additional Comments:  Dawayne Patricia, RN 05/25/2016, 11:54 AM

## 2016-05-25 NOTE — Progress Notes (Signed)
Physical Therapy Treatment Patient Details Name: [name] MRN: [id number] DOB: 1945-10-23 Today's Date: 05/25/2016    History of Present Illness 71 y.o. female with a PMH significant for Afib, HTN, DM, CHF, hepatitis and symptomatic bradycardia. She was brought to ED for dyspnea, productive cough in the setting of possible sepsis. She was also found to be in Afib RVR. Cardiology was consulted for management of her Afib RVR    PT Comments    Pt ambulated 200' with RW and supervision with no LOB with use of RW. Pt plans to use RW at home after discharge. She was able to ascend and descend 3 steps with min-guard A and no LOB, VSS. May d/c home today to be followed by HHPT.    Follow Up Recommendations  Home health PT;Supervision - Intermittent     Equipment Recommendations  None recommended by PT    Recommendations for Other Services       Precautions / Restrictions Precautions Precautions: Fall Restrictions Weight Bearing Restrictions: No    Mobility  Bed Mobility Overal bed mobility: Modified Independent                Transfers Overall transfer level: Modified independent Equipment used: None Transfers: Sit to/from Stand Sit to Stand: Modified independent (Device/Increase time)         General transfer comment: pt safe with transfers with and without use of RW  Ambulation/Gait Ambulation/Gait assistance: Supervision Ambulation Distance (Feet): 200 Feet Assistive device: Rolling walker (2 wheeled) Gait Pattern/deviations: Step-through pattern;Decreased stride length Gait velocity: decreased Gait velocity interpretation: Below normal speed for age/gender General Gait Details: pt preferred to ambulate with RW today and plans to use rollator at home. Pt sometimes gets distracted by environment and does not attend to balance   Stairs Stairs: Yes   Stair Management: Two rails;Step to pattern;Forwards Number of Stairs: 3 General stair comments: pt  uses step to pattern since THA, safe on stairs and VSS  Wheelchair Mobility    Modified Rankin (Stroke Patients Only)       Balance Overall balance assessment: Needs assistance Sitting-balance support: No upper extremity supported Sitting balance-Leahy Scale: Good     Standing balance support: No upper extremity supported;During functional activity Standing balance-Leahy Scale: Fair                      Cognition Arousal/Alertness: Awake/alert Behavior During Therapy: WFL for tasks assessed/performed Overall Cognitive Status: Within Functional Limits for tasks assessed                      Exercises      General Comments General comments (skin integrity, edema, etc.): reviewed HEP      Pertinent Vitals/Pain Pain Assessment: No/denies pain    Home Living                      Prior Function            PT Goals (current goals can now be found in the care plan section) Acute Rehab PT Goals Patient Stated Goal: to go home PT Goal Formulation: With patient Time For Goal Achievement: 06/07/16 Potential to Achieve Goals: Good Progress towards PT goals: Progressing toward goals    Frequency    Min 3X/week      PT Plan Current plan remains appropriate    Co-evaluation             End of Session  Activity Tolerance: Patient tolerated treatment well Patient left: in bed;with call bell/phone within reach Nurse Communication: Mobility status PT Visit Diagnosis: Unsteadiness on feet (R26.81)     Time: [id number] PT Time Calculation (min) (ACUTE ONLY): 23 min  Charges:  $Gait Training: 23-37 mins                    G Codes:     Leighton Roach, PT  Acute Rehab Services  [place] 05/25/2016, 2:29 PM

## 2016-05-26 LAB — BASIC METABOLIC PANEL
Anion gap: 12 (ref 5–15)
BUN: 24 mg/dL — AB (ref 6–20)
CHLORIDE: 98 mmol/L — AB (ref 101–111)
CO2: 29 mmol/L (ref 22–32)
CREATININE: 0.69 mg/dL (ref 0.44–1.00)
Calcium: 9 mg/dL (ref 8.9–10.3)
GFR calc Af Amer: >60 mL/min (ref >60)
GFR calc non Af Amer: >60 mL/min (ref >60)
GLUCOSE: 204 mg/dL — AB (ref 65–99)
Potassium: 4.4 mmol/L (ref 3.5–5.1)
SODIUM: 139 mmol/L (ref 135–145)

## 2016-05-26 LAB — CBC
HCT: 30.9 % — ABNORMAL LOW (ref 36.0–46.0)
HEMOGLOBIN: 8.5 g/dL — AB (ref 12.0–15.0)
MCH: 21.5 pg — ABNORMAL LOW (ref 26.0–34.0)
MCHC: 27.5 g/dL — AB (ref 30.0–36.0)
MCV: 78 fL (ref 78.0–100.0)
Platelets: 194 10*3/uL (ref 150–400)
RBC: 3.96 MIL/uL (ref 3.87–5.11)
RDW: 27.6 % — ABNORMAL HIGH (ref 11.5–15.5)
WBC: 10 10*3/uL (ref 4.0–10.5)

## 2016-05-26 LAB — GLUCOSE, CAPILLARY
GLUCOSE-CAPILLARY: 190 mg/dL — AB (ref 65–99)
Glucose-Capillary: 172 mg/dL — ABNORMAL HIGH (ref 65–99)

## 2016-05-26 NOTE — Progress Notes (Signed)
Inpatient Diabetes Program Recommendations  AACE/ADA: New Consensus Statement on Inpatient Glycemic Control (2015)  Target Ranges:  Prepandial:   less than 140 mg/dL      Peak postprandial:   less than 180 mg/dL (1-2 hours)      Critically ill patients:  140 - 180 mg/dL   Lab Results  Component Value Date   GLUCAP 190 (H) 05/26/2016   HGBA1C 6.0 (H) 02/03/2016    Review of Glycemic Control:  Results for [name], [name] (MRN [id number]) as of 05/26/2016 11:08  Ref. Range 05/25/2016 06:09 05/25/2016 11:19 05/25/2016 17:22 05/25/2016 21:31 05/26/2016 06:28  Glucose-Capillary Latest Ref Range: 65 - 99 mg/dL 162 (H) 196 (H) 405 (H) 340 (H) 190 (H)    Inpatient Diabetes Program Recommendations:   While on PO Prednisone, consider increasing Novolog meal coverage to 8 units tid with meals.   Thanks, Adah Perl, RN, BC-ADM Inpatient Diabetes Coordinator Pager [phone] (8a-5p)

## 2016-05-26 NOTE — Progress Notes (Signed)
Order received to discharge patient.  Telemetry monitor removed and CCMD notified.  PICC removed by IV team.  Discharge instructions, follow up, medications and instructions for their use were discussed with patient. 

## 2016-05-26 NOTE — Progress Notes (Signed)
Occupational Therapy Treatment Patient Details Name: [name] MRN: [id number] DOB: Aug 20, 1945 Today's Date: 05/26/2016    History of present illness 71 y.o. female with a PMH significant for Afib, HTN, DM, CHF, hepatitis and symptomatic bradycardia. She was brought to ED for dyspnea, productive cough in the setting of possible sepsis. She was also found to be in Afib RVR. Cardiology was consulted for management of her Afib RVR   OT comments  Pt able to complete UB/LB dressing, toilet transfer, and simulated tub transfer with mod I. D/c plan remains appropriate. Pt ready for d/c from OT standpoint but will continue to follow pt acutely.   Follow Up Recommendations  No OT follow up;Supervision - Intermittent    Equipment Recommendations  Tub/shower seat    Recommendations for Other Services      Precautions / Restrictions Precautions Precautions: Fall Restrictions Weight Bearing Restrictions: No       Mobility Bed Mobility               General bed mobility comments: Sitting EOB upon arrival  Transfers Overall transfer level: Modified independent                    Balance Overall balance assessment: No apparent balance deficits (not formally assessed)                                 ADL Overall ADL's : Modified independent                                       General ADL Comments: Pt able to complete UB/LB dressing, toilet and simulated tub transfer at mod I level.      Vision                     Perception     Praxis      Cognition   Behavior During Therapy: WFL for tasks assessed/performed Overall Cognitive Status: Within Functional Limits for tasks assessed                         Exercises     Shoulder Instructions       General Comments      Pertinent Vitals/ Pain       Pain Assessment: No/denies pain  Home Living                                           Prior Functioning/Environment              Frequency  Min 2X/week        Progress Toward Goals  OT Goals(current goals can now be found in the care plan section)  Progress towards OT goals: Progressing toward goals  Acute Rehab OT Goals Patient Stated Goal: home today OT Goal Formulation: With patient  Plan Discharge plan remains appropriate    Co-evaluation                 End of Session    OT Visit Diagnosis: Unsteadiness on feet (R26.81)   Activity Tolerance Patient tolerated treatment well   Patient Left Other (comment);with family/visitor present;with call bell/phone within reach (sitting EOB)  Nurse Communication          Time: [id number] OT Time Calculation (min): 9 min  Charges: OT General Charges $OT Visit: 1 Procedure OT Treatments $Self Care/Home Management : 8-22 mins  Nobel Brar A. Ulice Brilliant, M.S., OTR/L Pager: [place] 05/26/2016, 3:36 PM

## 2016-05-26 NOTE — Discharge Summary (Signed)
Physician Discharge Summary  [name] [id number] DOB: Oct 10, 1945 DOA: 05/20/2016  PCP: Tula Nakayama  Admit date: 05/20/2016 Discharge date: 05/26/2016  Recommendations for Outpatient Follow-up:  1. Continue Cardizem, metoprolol and lasix per home dose 2. Continue Amaryl and metformin on discharge  3. Follow up with PCP per scheduled appt   Discharge Diagnoses:  Principal Problem:   Sepsis ([place]) Active Problems:   Hypertension   DM2 (diabetes mellitus, type 2) ([facility])   Rheumatoid arthritis ([place])   Anxiety and depression   Iron deficiency anemia due to chronic blood loss   Atrial fibrillation with RVR ([facility])   Acute CHF (congestive heart failure) ([place])   Influenza A    Discharge Condition: stable   Diet recommendation: as tolerated   History of present illness:  71 y.o.femalewith history of atrial fibrillation, iron deficiency anemia with recent transfusion, depression with anxiety, DM2, and RA who presented to ED with worsening shortness of breath and fever. She was discharged from hospital 05/17/2016 at which time she was hospitalized for symptomatic anemia. She required 1 unit of PRBC transfusion. Her fecal occult blood test was negative and she was resumed on xarelto. She had initially done well back at home, but developed subjective fevers and chills with dyspnea and productive cough the day prior to this admission. Dyspnea worsened and the patient activated EMS, who found a heart rate close to 200. She was given 10 mg of diltiazem 2 with improvement and was started on diltiazem infusion.  In the ED, CXR showed mild cardiomegaly and pulmonary vascular congestion with bibasilar airspace opacities likely representing mild interstitial edema. CBC was notable for a stable hemoglobin of 8.5 with MCV of 74.9. Her influenza PCR was positive for influenza A and she was started on Tamiflu.   Hospital Course:   Sepsis due to Influenza A / Acute respiratory distress w/  bronchospasm  - CXR with bibasilar opacities felt to represent edema rather than infection - Influenza PCR positive for influenza A > on Tamiflu + IVIG study drug v/s placebo - Completed tamiflu  - Stopped solumedrol and started prednisone PO, may resume home dose prednisone on discharge - Stable resp status   - Zosyn stopped 2/20 - Blood cx negative so far   Chronic Atrial fibrillation with Acute RVR  - CHADS-VASc is 71 (age, gender, CHF, DM, HTN)  - Pt noted to be in a fib RVR by EMS and she was treated with 10 mg diltiazem IVP x2 en route. HR remained 130's despite diltiazem infusion titrated to 15 mg/hr; rate normalized after digoxin 0.25 IV x1. HR again poorly controlled w/ rate 140 - initiated amio gtt w/o control - loaded w/ IV digoxin. Subsequently digoxin and amiodarone stopped per cardio - Pt is currently on Cardizem 360 mg daily, metoprolol 100 mg PO BID  Acute on chronicdiastolic CHF  - Acute pulmonary edema likely secondary to rapid rate - Baseline weight appears to be ~80kg - TTE (04/06/16) with EF 55-60%, mild concentric hypertrophy, mild MR, and mild LAE; diastolic function could not be determined  - Continue lasix per home regimen   Iron-deficiency anemia - Hgb 8.5 on admission with MCV 74.9  - Recently hospitalized with symptomatic anemia and was transfused 1 unit; H/H remained stable - had serial negative FOBT's - Transfuse for hgb less than 7  Rheumatoid arthritis - Managed with leflunomide and prednisone 5 mg qD at home - Stopped solumedrol - May resume home dose prednisone   DM2 without complications without  long term insulin use - A1c 6.0 November 2017  - Continue Amaryl and metformin per home regimen   Hypertension, essential - Continue metoprolol and Cardizem  - Continue lasix   Hypokalemia - Due to lasix - Replaced and WNL  Dyslipidemia associated with type 2 DM - Continue Crestor and omega 3 supplementation   Depression, anxiety  -  Stable  - Continue Wellbutrin   DVT prophylaxis: On xarelto  Code Status: full code  Family Communication: no family at the bedside this am   Consultants:   DM coordinator   PT  Procedures:   None   Antimicrobials:   Tamiflu 05/21/2016 --> 05/26/2016  Vanco 05/20/2016 --> 05/24/2016  Zosyn 05/21/2016 --> 05/24/2016    Signed:  Leisa Lenz, MD  [facility] 05/26/2016, 1:11 PM  Pager #: [phone]  Time spent in minutes: less than 30 minutes    Discharge Exam: Vitals:   05/25/16 2007 05/26/16 0636  BP: 131/63 113/68  Pulse: 82 66  Resp: 18 18  Temp: 97.9 F (36.6 C) 97.8 F (36.6 C)   Vitals:   05/25/16 2007 05/25/16 2136 05/26/16 0636 05/26/16 0819  BP: 131/63  113/68   Pulse: 82  66   Resp: 18  18   Temp: 97.9 F (36.6 C)  97.8 F (36.6 C)   TempSrc: Oral  Oral   SpO2: 99% 98% 96% 96%  Weight:   78.8 kg (173 lb 12.8 oz)   Height:        General: Pt is alert, follows commands appropriately, not in acute distress Cardiovascular: Regular rate and rhythm, S1/S2 + Respiratory: Clear to auscultation bilaterally, no wheezing, no crackles, no rhonchi Abdominal: Soft, non tender, non distended, bowel sounds +, no guarding Extremities: no edema, no cyanosis, pulses palpable bilaterally DP and PT Neuro: Grossly nonfocal  Discharge Instructions  Discharge Instructions    Call MD for:  persistant nausea and vomiting    Complete by:  As directed    Call MD for:  severe uncontrolled pain    Complete by:  As directed    Call MD for:  temperature >100.4    Complete by:  As directed    Diet - low sodium heart healthy    Complete by:  As directed    Increase activity slowly    Complete by:  As directed      Allergies as of 05/26/2016   No Known Allergies     Medication List    STOP taking these medications   leflunomide 20 MG tablet Commonly known as:  ARAVA     TAKE these medications   buPROPion 150 MG 12 hr tablet Commonly  known as:  WELLBUTRIN SR Take 150 mg by mouth 2 (two) times daily.   diltiazem 360 MG 24 hr capsule Commonly known as:  CARDIZEM CD Take 1 capsule (360 mg total) by mouth daily.   esomeprazole 40 MG capsule Commonly known as:  NEXIUM Take 40 mg by mouth daily.   ferrous gluconate 324 MG tablet Commonly known as:  FERGON Take 1 tablet (324 mg total) by mouth 2 (two) times daily with a meal.   fluticasone 50 MCG/ACT nasal spray Commonly known as:  FLONASE Place 1-2 sprays into both nostrils daily as needed for allergies.   furosemide 20 MG tablet Commonly known as:  LASIX TAKE 2 TABLETS DAILY   glimepiride 2 MG tablet Commonly known as:  AMARYL Take 2 mg by mouth daily with breakfast.   metFORMIN  500 MG 24 hr tablet Commonly known as:  GLUCOPHAGE-XR Take 1,000 mg by mouth 2 (two) times daily.   metoprolol 50 MG tablet Commonly known as:  LOPRESSOR Take 2 tablets (100 mg total) by mouth 2 (two) times daily.   nitroGLYCERIN 0.4 MG SL tablet Commonly known as:  NITROSTAT Place 0.4 mg under the tongue every 5 (five) minutes as needed for chest pain (x 3 doses).   Omega 3 1200 MG Caps Take 1 capsule by mouth daily.   ORENCIA CLICKJECT 0000000 MG/ML [place] Generic drug:  Abatacept Inject 125 mg into the skin once a week.   potassium chloride 10 MEQ tablet Commonly known as:  KLOR-CON 10 Take 2 tablets (20 mEq total) by mouth 2 (two) times daily.   predniSONE 5 MG tablet Commonly known as:  DELTASONE Take 5 mg by mouth daily.   PRESERVISION AREDS Caps Take 1 capsule by mouth 2 (two) times daily.   rivaroxaban 20 MG Tabs tablet Commonly known as:  XARELTO Take 20 mg by mouth daily with supper.   rosuvastatin 10 MG tablet Commonly known as:  CRESTOR Take 10 mg by mouth daily.   VITAMIN B 12 PO Take 1,000 mcg by mouth daily.   Vitamin D3 1000 units Caps Take 1,000 Units by mouth daily.            Durable Medical Equipment        Start     Ordered    05/25/16 [phone]  For home use only DME Tub bench  Once     05/25/16 0843     Follow-up Information    [facility] Follow up.   Specialty:  Home Health Services Why:  HHRN/PT/OT/aide arranged- please allow 24-48 hr post discharge for them to contact you to set up home visits Contact information: [place] [address] [phone]        Kerin Ransom, PA-C Follow up on 06/06/2016.   Specialties:  Cardiology, Radiology Why:  9:30 AM Contact information: [address] [place] [address] [phone]        KAPLAN,KRISTEN, PA-C. Schedule an appointment as soon as possible for a visit in 1 week(s).   Specialty:  Family Medicine Contact information: [address] [place] [address] [phone]            The results of significant diagnostics from this hospitalization (including imaging, microbiology, ancillary and laboratory) are listed below for reference.    Significant Diagnostic Studies: Dg Chest 2 View  Result Date: 05/16/2016 CLINICAL DATA:  Shortness of breath for 2 weeks, chronic dry cough, history atrial fibrillation, hypertension, diabetes mellitus, pneumonia EXAM: CHEST  2 VIEW COMPARISON:  03/19/2016 FINDINGS: Enlargement of cardiac silhouette with pulmonary vascular congestion. Mediastinal contours normal. Slight chronic accentuation of perihilar markings, stable. No definite acute infiltrate, pneumothorax or acute osseous findings. Blunting of posterior costophrenic angles by tiny effusions. IMPRESSION: Enlargement of cardiac silhouette with pulmonary vascular congestion. Tiny pleural effusions without pneumothorax. Electronically Signed   By: Lavonia Dana M.D.   On: 05/16/2016 18:15   Dg Chest Port 1 View  Result Date: 05/22/2016 CLINICAL DATA:  Pulmonary edema. EXAM: PORTABLE CHEST 1 VIEW COMPARISON:  Yesterday FINDINGS: Right upper extremity PICC with tip at the upper right atrium. Cardiomegaly. Increased  generalized interstitial opacity without Kerley lines. No effusion or pneumothorax. No air bronchogram. IMPRESSION: Increased vascular congestion. Electronically Signed   By: Monte Fantasia M.D.   On:  05/22/2016 07:15   Dg Chest Port 1 View  Result Date: 05/21/2016 CLINICAL DATA:  Line placement EXAM: PORTABLE CHEST 1 VIEW COMPARISON:  Earlier the same day FINDINGS: 1816 hours. Right PICC line is new in the interval with the tip overlying the distal SVC level. The cardio pericardial silhouette is enlarged. There is pulmonary vascular congestion without overt pulmonary edema. Interstitial markings are diffusely coarsened with chronic features. Bone windows reveal no worrisome lytic or sclerotic osseous lesions. Telemetry leads overlie the chest. IMPRESSION: Right PICC line tip overlies the distal SVC level. Electronically Signed   By: Misty Stanley M.D.   On: 05/21/2016 18:35   Dg Chest Port 1 View  Result Date: 05/21/2016 CLINICAL DATA:  Shortness of breath and cough EXAM: PORTABLE CHEST 1 VIEW COMPARISON:  May 20, 2016 and May 16, 2016 FINDINGS: There remains mild cardiac enlargement with mild pulmonary venous hypertension. No frank edema or consolidation. Interstitium is prominent ; there may be slight interstitial edema. No evident adenopathy. No bone lesions. IMPRESSION: Pulmonary vascular congestion with questionable mild interstitial edema. Essentially stable study compared to 1 day prior. No consolidation. Electronically Signed   By: Lowella Grip III M.D.   On: 05/21/2016 10:38   Dg Chest Portable 1 View  Result Date: 05/20/2016 CLINICAL DATA:  Acute onset of shortness of breath. Initial encounter. EXAM: PORTABLE CHEST 1 VIEW COMPARISON:  Chest radiograph performed 05/16/2016 FINDINGS: The lungs are well-aerated. Vascular congestion is noted. Bibasilar airspace opacities may reflect mild interstitial edema. No pleural effusion or pneumothorax is seen. The cardiomediastinal  silhouette is mildly enlarged. No acute osseous abnormalities are seen. IMPRESSION: Vascular congestion and mild cardiomegaly. Bibasilar airspace opacities may reflect mild interstitial edema. Electronically Signed   By: Garald Balding M.D.   On: 05/20/2016 19:59    Microbiology: Recent Results (from the past 240 hour(s))  Blood culture (routine x 2)     Status: None   Collection Time: 05/20/16  8:45 PM  Result Value Ref Range Status   Specimen Description BLOOD RIGHT HAND  Final   Special Requests BOTTLES DRAWN AEROBIC AND ANAEROBIC 5CC  Final   Culture NO GROWTH 5 DAYS  Final   Report Status 05/25/2016 FINAL  Final  Blood culture (routine x 2)     Status: None   Collection Time: 05/20/16  8:50 PM  Result Value Ref Range Status   Specimen Description BLOOD LEFT HAND  Final   Special Requests BOTTLES DRAWN AEROBIC AND ANAEROBIC 5CC  Final   Culture NO GROWTH 5 DAYS  Final   Report Status 05/25/2016 FINAL  Final  MRSA PCR Screening     Status: None   Collection Time: 05/20/16 10:57 PM  Result Value Ref Range Status   MRSA by PCR NEGATIVE NEGATIVE Final    Comment:        The GeneXpert MRSA Assay (FDA approved for NASAL specimens only), is one component of a comprehensive MRSA colonization surveillance program. It is not intended to diagnose MRSA infection nor to guide or monitor treatment for MRSA infections.   Urine culture     Status: Abnormal   Collection Time: 05/21/16  2:30 AM  Result Value Ref Range Status   Specimen Description URINE, CATHETERIZED  Final   Special Requests NONE  Final   Culture <10,000 COLONIES/mL INSIGNIFICANT GROWTH (A)  Final   Report Status 05/22/2016 FINAL  Final     Labs: Basic Metabolic Panel:  Recent Labs Lab 05/21/16 0203 05/22/16 0522 05/23/16 [id number]  05/25/16 0434 05/26/16 0418  NA 137 137 142 140 139  K 3.1* 4.2 4.3 4.4 4.4  CL 103 103 106 103 98*  CO2 22 24 28 29 29   GLUCOSE 54* 272* 236* 176* 204*  BUN 10 14 17 20  24*   CREATININE 0.66 0.79 0.63 0.68 0.69  CALCIUM 8.4* 8.1* 8.6* 9.0 9.0  MG  --  1.9  --   --   --    Liver Function Tests:  Recent Labs Lab 05/20/16 1937 05/21/16 0203 05/22/16 0522  AST 33 26 45*  ALT 13* 11* 27  ALKPHOS 115 103 99  BILITOT 0.9 1.1 0.8  PROT 6.2* 5.8* 5.8*  ALBUMIN 3.1* 2.7* 2.7*   No results for input(s): LIPASE, AMYLASE in the last 168 hours. No results for input(s): AMMONIA in the last 168 hours. CBC:  Recent Labs Lab 05/20/16 1937 05/21/16 0203 05/22/16 0522 05/23/16 0516 05/25/16 0434 05/26/16 0418  WBC 9.7 8.3 9.8 9.9 10.1 10.0  NEUTROABS 8.4* 6.8  --   --   --   --   HGB 8.5* 8.1* 7.8* 8.2* 8.6* 8.5*  HCT 29.2* 28.6* 27.9* 28.9* 31.4* 30.9*  MCV 74.9* 75.1* 76.4* 77.5* 77.7* 78.0  PLT 235 221 191 181 196 194   Cardiac Enzymes:  Recent Labs Lab 05/20/16 2248 05/21/16 0203 05/21/16 0925  TROPONINI 0.03* 0.03* 0.03*   BNP: BNP (last 3 results)  Recent Labs  03/19/16 1740 05/11/16 0938 05/20/16 1937  BNP 227.9* 250.3* 349.7*    ProBNP (last 3 results) No results for input(s): PROBNP in the last 8760 hours.  CBG:  Recent Labs Lab 05/25/16 1119 05/25/16 1722 05/25/16 2131 05/26/16 0628 05/26/16 1126  GLUCAP 196* 405* 340* 190* 172*

## 2016-05-26 NOTE — Progress Notes (Signed)
Progress Note  Patient Name: [name] Date of Encounter: 05/26/2016  Primary Cardiologist: Stanford Breed  Subjective   Feels great, eager to go home.  Inpatient Medications    Scheduled Meds: . benzonatate  200 mg Oral TID  . buPROPion  150 mg Oral BID WC  . cholecalciferol  1,000 Units Oral Daily  . diltiazem  360 mg Oral Daily  . furosemide  40 mg Intravenous Daily  . guaiFENesin  600 mg Oral BID  . insulin aspart  0-20 Units Subcutaneous TID WC  . insulin aspart  0-5 Units Subcutaneous QHS  . insulin aspart  5 Units Subcutaneous TID WC  . insulin glargine  16 Units Subcutaneous QHS  . ipratropium  0.5 mg Nebulization BID  . levalbuterol  0.63 mg Nebulization BID  . mouth rinse  15 mL Mouth Rinse BID  . metoprolol  100 mg Oral BID  . metoprolol tartrate  50 mg Oral Once  . omega-3 acid ethyl esters  1,000 mg Oral Daily  . pantoprazole  40 mg Oral Daily  . potassium chloride  40 mEq Oral BID  . rivaroxaban  20 mg Oral Q supper  . rosuvastatin  10 mg Oral Daily  . sodium chloride flush  10-40 mL Intracatheter Q12H  . sodium chloride flush  3 mL Intravenous Q12H   Continuous Infusions: . sodium chloride     PRN Meds: acetaminophen **OR** acetaminophen, bisacodyl, fluticasone, HYDROcodone-acetaminophen, levalbuterol, LORazepam, metoprolol, morphine injection, ondansetron **OR** ondansetron (ZOFRAN) IV, polyethylene glycol, sodium chloride flush   Vital Signs    Vitals:   05/25/16 2007 05/25/16 2136 05/26/16 0636 05/26/16 0819  BP: 131/63  113/68   Pulse: 82  66   Resp: 18  18   Temp: 97.9 F (36.6 C)  97.8 F (36.6 C)   TempSrc: Oral  Oral   SpO2: 99% 98% 96% 96%  Weight:   78.8 kg (173 lb 12.8 oz)   Height:       No intake or output data in the 24 hours ending 05/26/16 1308 Filed Weights   05/23/16 0500 05/25/16 0456 05/26/16 0636  Weight: 79.6 kg (175 lb 8 oz) 79.9 kg (176 lb 1.6 oz) 78.8 kg (173 lb 12.8 oz)    Telemetry    AF with controlled rate  - Personally Reviewed  Physical Exam  Comfortable GEN: No acute distress.   Neck: No JVD Cardiac: irregular, no murmurs, rubs, or gallops.  Respiratory: Clear to auscultation bilaterally. GI: Soft, nontender, non-distended  MS: No edema; No deformity. Neuro:  Nonfocal  Psych: Normal affect   Labs    Chemistry Recent Labs Lab 05/20/16 1937 05/21/16 0203 05/22/16 0522 05/23/16 0516 05/25/16 0434 05/26/16 0418  NA 135 137 137 142 140 139  K 3.7 3.1* 4.2 4.3 4.4 4.4  CL 104 103 103 106 103 98*  CO2 19* 22 24 28 29 29   GLUCOSE 120* 54* 272* 236* 176* 204*  BUN 11 10 14 17 20  24*  CREATININE 0.64 0.66 0.79 0.63 0.68 0.69  CALCIUM 8.6* 8.4* 8.1* 8.6* 9.0 9.0  PROT 6.2* 5.8* 5.8*  --   --   --   ALBUMIN 3.1* 2.7* 2.7*  --   --   --   AST 33 26 45*  --   --   --   ALT 13* 11* 27  --   --   --   ALKPHOS 115 103 99  --   --   --  BILITOT 0.9 1.1 0.8  --   --   --   GFRNONAA >60 >60 >60 >60 >60 >60  GFRAA >60 >60 >60 >60 >60 >60  ANIONGAP 12 12 10 8 8 12      Hematology Recent Labs Lab 05/23/16 0516 05/25/16 0434 05/26/16 0418  WBC 9.9 10.1 10.0  RBC 3.73* 4.04 3.96  HGB 8.2* 8.6* 8.5*  HCT 28.9* 31.4* 30.9*  MCV 77.5* 77.7* 78.0  MCH 22.0* 21.3* 21.5*  MCHC 28.4* 27.4* 27.5*  RDW 26.4* 27.1* 27.6*  PLT 181 196 194    Cardiac Enzymes Recent Labs Lab 05/20/16 2248 05/21/16 0203 05/21/16 0925  TROPONINI 0.03* 0.03* 0.03*    Recent Labs Lab 05/20/16 1946  TROPIPOC 0.02     BNP Recent Labs Lab 05/20/16 1937  BNP 349.7*     Patient Profile     71 y.o. female with chronic atrial fibrillation recovering from influenza.  Assessment & Plan    DC home on chronic meds for AF rate control. Follow up is already scheduled  Signed, Sanda Klein, MD  05/26/2016, 1:08 PM

## 2016-05-30 ENCOUNTER — Emergency Department (HOSPITAL_COMMUNITY): Payer: Medicare Other

## 2016-05-30 ENCOUNTER — Inpatient Hospital Stay (HOSPITAL_COMMUNITY)
Admission: EM | Admit: 2016-05-30 | Discharge: 2016-06-01 | DRG: 391 | Disposition: A | Discharge status: Home / Self Care | Payer: Medicare Other | Attending: Internal Medicine | Admitting: Internal Medicine

## 2016-05-30 ENCOUNTER — Encounter (HOSPITAL_COMMUNITY): Payer: Self-pay

## 2016-05-30 ENCOUNTER — Ambulatory Visit: Payer: Medicare Other | Admitting: Cardiology

## 2016-05-30 DIAGNOSIS — R9431 Abnormal electrocardiogram [ECG] [EKG]: Secondary | ICD-10-CM | POA: Diagnosis not present

## 2016-05-30 DIAGNOSIS — B349 Viral infection, unspecified: Secondary | ICD-10-CM | POA: Insufficient documentation

## 2016-05-30 DIAGNOSIS — D500 Iron deficiency anemia secondary to blood loss (chronic): Secondary | ICD-10-CM | POA: Diagnosis not present

## 2016-05-30 DIAGNOSIS — R404 Transient alteration of awareness: Secondary | ICD-10-CM | POA: Diagnosis not present

## 2016-05-30 DIAGNOSIS — R61 Generalized hyperhidrosis: Secondary | ICD-10-CM | POA: Diagnosis not present

## 2016-05-30 DIAGNOSIS — R9389 Abnormal findings on diagnostic imaging of other specified body structures: Secondary | ICD-10-CM | POA: Diagnosis present

## 2016-05-30 DIAGNOSIS — E7800 Pure hypercholesterolemia, unspecified: Secondary | ICD-10-CM | POA: Diagnosis present

## 2016-05-30 DIAGNOSIS — R112 Nausea with vomiting, unspecified: Secondary | ICD-10-CM | POA: Diagnosis present

## 2016-05-30 DIAGNOSIS — E118 Type 2 diabetes mellitus with unspecified complications: Secondary | ICD-10-CM | POA: Diagnosis not present

## 2016-05-30 DIAGNOSIS — R42 Dizziness and giddiness: Secondary | ICD-10-CM | POA: Diagnosis not present

## 2016-05-30 DIAGNOSIS — Z79899 Other long term (current) drug therapy: Secondary | ICD-10-CM

## 2016-05-30 DIAGNOSIS — R05 Cough: Secondary | ICD-10-CM | POA: Diagnosis not present

## 2016-05-30 DIAGNOSIS — Z96643 Presence of artificial hip joint, bilateral: Secondary | ICD-10-CM | POA: Diagnosis present

## 2016-05-30 DIAGNOSIS — I482 Chronic atrial fibrillation, unspecified: Secondary | ICD-10-CM | POA: Diagnosis present

## 2016-05-30 DIAGNOSIS — R938 Abnormal findings on diagnostic imaging of other specified body structures: Secondary | ICD-10-CM | POA: Diagnosis not present

## 2016-05-30 DIAGNOSIS — K219 Gastro-esophageal reflux disease without esophagitis: Secondary | ICD-10-CM | POA: Diagnosis not present

## 2016-05-30 DIAGNOSIS — E860 Dehydration: Secondary | ICD-10-CM | POA: Diagnosis present

## 2016-05-30 DIAGNOSIS — Z86718 Personal history of other venous thrombosis and embolism: Secondary | ICD-10-CM

## 2016-05-30 DIAGNOSIS — M069 Rheumatoid arthritis, unspecified: Secondary | ICD-10-CM | POA: Diagnosis present

## 2016-05-30 DIAGNOSIS — I4821 Permanent atrial fibrillation: Secondary | ICD-10-CM | POA: Diagnosis present

## 2016-05-30 DIAGNOSIS — E1151 Type 2 diabetes mellitus with diabetic peripheral angiopathy without gangrene: Secondary | ICD-10-CM | POA: Diagnosis not present

## 2016-05-30 DIAGNOSIS — E785 Hyperlipidemia, unspecified: Secondary | ICD-10-CM

## 2016-05-30 DIAGNOSIS — E119 Type 2 diabetes mellitus without complications: Secondary | ICD-10-CM

## 2016-05-30 DIAGNOSIS — A084 Viral intestinal infection, unspecified: Principal | ICD-10-CM | POA: Diagnosis present

## 2016-05-30 DIAGNOSIS — J129 Viral pneumonia, unspecified: Secondary | ICD-10-CM | POA: Diagnosis not present

## 2016-05-30 DIAGNOSIS — R059 Cough, unspecified: Secondary | ICD-10-CM

## 2016-05-30 DIAGNOSIS — Z7984 Long term (current) use of oral hypoglycemic drugs: Secondary | ICD-10-CM

## 2016-05-30 DIAGNOSIS — Z09 Encounter for follow-up examination after completed treatment for conditions other than malignant neoplasm: Secondary | ICD-10-CM

## 2016-05-30 DIAGNOSIS — Z7901 Long term (current) use of anticoagulants: Secondary | ICD-10-CM

## 2016-05-30 DIAGNOSIS — E1169 Type 2 diabetes mellitus with other specified complication: Secondary | ICD-10-CM

## 2016-05-30 DIAGNOSIS — I10 Essential (primary) hypertension: Secondary | ICD-10-CM | POA: Diagnosis present

## 2016-05-30 LAB — CBC
HEMATOCRIT: 37.5 % (ref 36.0–46.0)
Hemoglobin: 10.4 g/dL — ABNORMAL LOW (ref 12.0–15.0)
MCH: 21.7 pg — ABNORMAL LOW (ref 26.0–34.0)
MCHC: 27.7 g/dL — AB (ref 30.0–36.0)
MCV: 78.1 fL (ref 78.0–100.0)
Platelets: 292 10*3/uL (ref 150–400)
RBC: 4.8 MIL/uL (ref 3.87–5.11)
RDW: 26.3 % — AB (ref 11.5–15.5)
WBC: 12 10*3/uL — ABNORMAL HIGH (ref 4.0–10.5)

## 2016-05-30 LAB — GLUCOSE, CAPILLARY: Glucose-Capillary: 79 mg/dL (ref 65–99)

## 2016-05-30 LAB — COMPREHENSIVE METABOLIC PANEL
ALBUMIN: 2.8 g/dL — AB (ref 3.5–5.0)
ALT: 16 U/L (ref 14–54)
ANION GAP: 10 (ref 5–15)
AST: 17 U/L (ref 15–41)
Alkaline Phosphatase: 95 U/L (ref 38–126)
BUN: 14 mg/dL (ref 6–20)
CHLORIDE: 104 mmol/L (ref 101–111)
CO2: 24 mmol/L (ref 22–32)
Calcium: 8.9 mg/dL (ref 8.9–10.3)
Creatinine, Ser: 0.63 mg/dL (ref 0.44–1.00)
GFR calc Af Amer: >60 mL/min (ref >60)
GFR calc non Af Amer: >60 mL/min (ref >60)
GLUCOSE: 206 mg/dL — AB (ref 65–99)
POTASSIUM: 4 mmol/L (ref 3.5–5.1)
SODIUM: 138 mmol/L (ref 135–145)
TOTAL PROTEIN: 6.1 g/dL — AB (ref 6.5–8.1)
Total Bilirubin: 1.1 mg/dL (ref 0.3–1.2)

## 2016-05-30 LAB — CBG MONITORING, ED: GLUCOSE-CAPILLARY: 149 mg/dL — AB (ref 65–99)

## 2016-05-30 LAB — TROPONIN I
Troponin I: <0.03 ng/mL (ref <0.03)
Troponin I: <0.03 ng/mL (ref <0.03)

## 2016-05-30 LAB — LIPASE, BLOOD: LIPASE: 19 U/L (ref 11–51)

## 2016-05-30 MED ORDER — PREDNISONE 5 MG PO TABS
5.0000 mg | ORAL_TABLET | Freq: Every day | ORAL | Status: DC
  Administered 2016-05-31 – 2016-06-01 (×2): 5 mg via ORAL
  Filled 2016-05-30 (×2): qty 1

## 2016-05-30 MED ORDER — SODIUM CHLORIDE 0.9 % IV BOLUS (SEPSIS)
1000.0000 mL | Freq: Once | INTRAVENOUS | Status: AC
  Administered 2016-05-30: 1000 mL via INTRAVENOUS

## 2016-05-30 MED ORDER — ONDANSETRON HCL 4 MG/2ML IJ SOLN
4.0000 mg | Freq: Once | INTRAMUSCULAR | Status: AC
  Administered 2016-05-30: 4 mg via INTRAVENOUS
  Filled 2016-05-30: qty 2

## 2016-05-30 MED ORDER — PANTOPRAZOLE SODIUM 40 MG PO TBEC
40.0000 mg | DELAYED_RELEASE_TABLET | Freq: Every day | ORAL | Status: DC
  Administered 2016-05-30 – 2016-06-01 (×3): 40 mg via ORAL
  Filled 2016-05-30 (×3): qty 1

## 2016-05-30 MED ORDER — DILTIAZEM HCL ER COATED BEADS 180 MG PO CP24
360.0000 mg | ORAL_CAPSULE | Freq: Every day | ORAL | Status: DC
  Administered 2016-05-30 – 2016-06-01 (×3): 360 mg via ORAL
  Filled 2016-05-30 (×3): qty 2

## 2016-05-30 MED ORDER — METOPROLOL TARTRATE 100 MG PO TABS
100.0000 mg | ORAL_TABLET | Freq: Two times a day (BID) | ORAL | Status: DC
  Administered 2016-05-30 – 2016-06-01 (×4): 100 mg via ORAL
  Filled 2016-05-30 (×4): qty 2

## 2016-05-30 MED ORDER — ACETAMINOPHEN 650 MG RE SUPP: 650.0000 mg | Freq: Four times a day (QID) | RECTAL | Status: DC | PRN

## 2016-05-30 MED ORDER — SODIUM CHLORIDE 0.9 % IV SOLN
INTRAVENOUS | Status: DC
  Administered 2016-05-30 – 2016-05-31 (×2): via INTRAVENOUS

## 2016-05-30 MED ORDER — ACETAMINOPHEN 325 MG PO TABS: 650.0000 mg | ORAL_TABLET | Freq: Four times a day (QID) | ORAL | Status: DC | PRN

## 2016-05-30 MED ORDER — ONDANSETRON HCL 4 MG/2ML IJ SOLN
4.0000 mg | Freq: Four times a day (QID) | INTRAMUSCULAR | Status: DC | PRN
  Administered 2016-05-30 – 2016-05-31 (×2): 4 mg via INTRAVENOUS
  Filled 2016-05-30 (×3): qty 2

## 2016-05-30 MED ORDER — LEVOFLOXACIN 750 MG PO TABS
750.0000 mg | ORAL_TABLET | Freq: Once | ORAL | Status: AC
  Administered 2016-05-30: 750 mg via ORAL
  Filled 2016-05-30: qty 1

## 2016-05-30 MED ORDER — BUPROPION HCL ER (SR) 150 MG PO TB12
150.0000 mg | ORAL_TABLET | Freq: Two times a day (BID) | ORAL | Status: DC
  Administered 2016-05-30 – 2016-06-01 (×4): 150 mg via ORAL
  Filled 2016-05-30 (×6): qty 1

## 2016-05-30 MED ORDER — INSULIN ASPART 100 UNIT/ML ~~LOC~~ SOLN: 0.0000 [IU] | Freq: Every day | SUBCUTANEOUS | Status: DC

## 2016-05-30 MED ORDER — PROMETHAZINE HCL 25 MG/ML IJ SOLN: 6.2500 mg | Freq: Four times a day (QID) | INTRAMUSCULAR | Status: DC | PRN

## 2016-05-30 MED ORDER — SODIUM CHLORIDE 0.9% FLUSH
3.0000 mL | Freq: Two times a day (BID) | INTRAVENOUS | Status: DC
  Administered 2016-06-01: 3 mL via INTRAVENOUS

## 2016-05-30 MED ORDER — INSULIN ASPART 100 UNIT/ML ~~LOC~~ SOLN
0.0000 [IU] | Freq: Three times a day (TID) | SUBCUTANEOUS | Status: DC
  Administered 2016-05-31 (×3): 2 [IU] via SUBCUTANEOUS

## 2016-05-30 MED ORDER — RIVAROXABAN 20 MG PO TABS
20.0000 mg | ORAL_TABLET | Freq: Every day | ORAL | Status: DC
  Administered 2016-05-30 – 2016-05-31 (×2): 20 mg via ORAL
  Filled 2016-05-30 (×3): qty 1

## 2016-05-30 NOTE — ED Notes (Signed)
Attempted report x1. 

## 2016-05-30 NOTE — H&P (Signed)
History and Physical    [name] [id number] DOB: 02-13-1946 DOA: 05/30/2016   PCP: Tula Nakayama   Patient coming from/Resides with: Private residence/husband  Admission status: Observation/telemetry -it may be medically necessary to stay a minimum 2 midnights to rule out impending and/or unexpected changes in physiologic status that may differ from initial evaluation performed in the ER and/or at time of admission therefore consider reevaluation of admission status 24 hours.   Chief Complaint: Nausea vomiting with diaphoresis  HPI: [name] is a 71 y.o. female with medical history significant for GERD, atrial fibrillation on anticoagulation, hypertension, diabetes, rheumatoid arthritis on chronic steroids and iron deficiency anemia due to prior chronic blood loss. Patient was recently discharged on 2/22 afternoon admission for pneumonia and influenza A. Since discharge she has had a persistent wet sounding cough with clear sputum. She has not had any worsening of her respiratory status, no fevers or chills, no chest pain or palpitations. During the night patient reports that she awakened with left-sided abdominal pain which eventually subsided and she went back to sleep. She awakened at 9 AM with significant nausea associated with dizziness had an episode of emesis after drinking Coke and became diaphoretic. It was no blood in the emesis. She has not had any diarrhea or recurrence of the previously mentioned abdominal pain. She is currently symptom free in the ER after symptomatic treatment with antiemetic medications and IV fluids. EKG in the ER was unremarkable.  ED Course:  Vital Signs: BP (!) 115/53   Pulse 61   Temp 97.8 F (36.6 C) (Oral)   Resp 22   SpO2 97%  2 view CXR: Decreased pulmonary vascular congestion with mild asymmetric interstitial prominence of the left lung base may be related to mild residual vascular congestion although atypical infection is not  excluded Lab data: Sodium 138, potassium 4.0, chloride 14, CO2 24, glucose 206, BUN 14, creatinine 0.63, LFTs normal, troponin less than 0.03, white count 12,000 differential not obtained, hemoglobin 10.4, platelets 294,000 Medications and treatments: Normal saline 1 L, Zofran 4 mg IV 1  Review of Systems:  In addition to the HPI above,  No Fever-chills, myalgias or other constitutional symptoms No Headache, changes with Vision or hearing, new weakness, tingling, numbness in any extremity, dysarthria or word finding difficulty, gait disturbance or imbalance, tremors or seizure activity No problems swallowing food or Liquids, indigestion/reflux, choking or coughing while eating, abdominal pain with or after eating No Chest pain, Cough or Shortness of Breath, palpitations, orthopnea or DOE No melena,hematochezia, dark tarry stools, constipation No dysuria, malodorous urine, hematuria or flank pain No new skin rashes, lesions, masses or bruises, No new joint pains, aches, swelling or redness No recent unintentional weight gain or loss No polyuria, polydypsia or polyphagia   Past Medical History:  Diagnosis Date  . Actinic keratosis   . Anemia    hx of  . Anemia of chronic renal failure, stage 3 (moderate) 07/17/2015  . Anticoagulant causing adverse effect in therapeutic use 07/17/2015  . Anxiety   . Atrial fibrillation ([place])   . Atrial fibrillation ([place])   . Carpal tunnel syndrome   . Chest pain   . Depression   . Edema   . Gastroesophageal reflux disease   . Hepatitis 1970  . Hiatal hernia   . Hyperlipidemia   . Hypertension   . Iron deficiency anemia due to chronic blood loss 07/17/2015  . Jaundice    age 10  . Memory loss   .  Peripheral vascular disease (H[facility])    DVT left lower leg  . Rheumatoid arthritis(714.0)   . Thyroid disease    multi nodular goiter  . Type II diabetes mellitus ([place])     Past Surgical History:  Procedure Laterality Date  . CATARACT EXTRACTION  Left   . COLONOSCOPY WITH PROPOFOL N/A 02/20/2015   Procedure: COLONOSCOPY WITH PROPOFOL;  Surgeon: Carol Ada, MD;  Location: [facility];  Service: Endoscopy;  Laterality: N/A;  . ENTEROSCOPY N/A 03/23/2016   Procedure: ENTEROSCOPY;  Surgeon: Carol Ada, MD;  Location: [facility];  Service: Endoscopy;  Laterality: N/A;  . ESOPHAGOGASTRODUODENOSCOPY (EGD) WITH PROPOFOL N/A 02/20/2015   Procedure: ESOPHAGOGASTRODUODENOSCOPY (EGD) WITH PROPOFOL;  Surgeon: Carol Ada, MD;  Location: [facility];  Service: Endoscopy;  Laterality: N/A;  . GIVENS CAPSULE STUDY N/A 03/21/2016   Procedure: GIVENS CAPSULE STUDY;  Surgeon: Carol Ada, MD;  Location: [facility];  Service: Endoscopy;  Laterality: N/A;  . HAND SURGERY  1995 and 1996   artificial joints both hands  . JOINT REPLACEMENT    . LUMBAR LAMINECTOMY/DECOMPRESSION MICRODISCECTOMY N/A 02/10/2016   Procedure: MICROLUMBAR DECOMPRESSION L4-L5 AND L3- L4, AND EXCISION OF SYNOVIAL CYST L4-L5;  Surgeon: Susa Day, MD;  Location: [facility];  Service: Orthopedics;  Laterality: N/A;  . TOTAL HIP ARTHROPLASTY Left 2009  . TOTAL HIP ARTHROPLASTY Right 04/03/2013   Procedure: RIGHT TOTAL HIP ARTHROPLASTY ANTERIOR APPROACH;  Surgeon: Gearlean Alf, MD;  Location: [facility];  Service: Orthopedics;  Laterality: Right;    Social History   Social History  . Marital status: Divorced    Spouse name: N/A  . Number of children: N/A  . Years of education: N/A   Occupational History  . Retired    Social History Main Topics  . Smoking status: Never Smoker  . Smokeless tobacco: Never Used  . Alcohol use No  . Drug use: No  . Sexual activity: Not Currently   Other Topics Concern  . Not on file   Social History Narrative  . No narrative on file    Mobility: Utilizes a cane and walker Work history: Not obtained   No Known Allergies  Family History  Problem Relation Age of Onset  . Stroke Father   . Heart failure Sister   .  Hypertension Sister   . Heart attack Neg Hx      Prior to Admission medications   Medication Sig Start Date End Date Taking? Authorizing Provider  Abatacept (ORENCIA CLICKJECT) 0000000 MG/ML SOAJ Inject 125 mg into the skin once a week.    Yes Historical Provider, MD  buPROPion (WELLBUTRIN SR) 150 MG 12 hr tablet Take 150 mg by mouth 2 (two) times daily.     Yes Historical Provider, MD  Cholecalciferol (VITAMIN D3) 1000 units CAPS Take 1,000 Units by mouth daily.   Yes Historical Provider, MD  Cyanocobalamin (VITAMIN B 12 PO) Take 1,000 mcg by mouth daily.    Yes Historical Provider, MD  diltiazem (CARDIZEM CD) 360 MG 24 hr capsule Take 1 capsule (360 mg total) by mouth daily. 05/11/16  Yes Luke K Kilroy, PA-C  esomeprazole (NEXIUM) 40 MG capsule Take 40 mg by mouth daily.     Yes Historical Provider, MD  furosemide (LASIX) 20 MG tablet TAKE 2 TABLETS DAILY 04/05/16  Yes Lelon Perla, MD  glimepiride (AMARYL) 2 MG tablet Take 2 mg by mouth daily with breakfast.  11/03/14  Yes Historical Provider, MD  metFORMIN (GLUCOPHAGE) 500 MG tablet Take 1,000  mg by mouth 2 (two) times daily with a meal.   Yes Historical Provider, MD  metoprolol (LOPRESSOR) 50 MG tablet Take 2 tablets (100 mg total) by mouth 2 (two) times daily. 05/11/16  Yes Erlene Quan, PA-C  Multiple Vitamins-Minerals (PRESERVISION AREDS 2) CAPS Take 1 capsule by mouth 2 (two) times daily.   Yes Historical Provider, MD  Multiple Vitamins-Minerals (PRESERVISION AREDS) CAPS Take 1 capsule by mouth 2 (two) times daily.   Yes Historical Provider, MD  nitroGLYCERIN (NITROSTAT) 0.4 MG SL tablet Place 0.4 mg under the tongue every 5 (five) minutes as needed for chest pain (x 3 doses).    Yes Historical Provider, MD  Omega 3 1200 MG CAPS Take 1 capsule by mouth daily.   Yes Historical Provider, MD  potassium chloride (KLOR-CON 10) 10 MEQ tablet Take 2 tablets (20 mEq total) by mouth 2 (two) times daily. 01/04/16  Yes Lelon Perla, MD  predniSONE  (DELTASONE) 5 MG tablet Take 5 mg by mouth daily.   Yes Historical Provider, MD  rivaroxaban (XARELTO) 20 MG TABS tablet Take 20 mg by mouth daily with supper.   Yes Historical Provider, MD  rosuvastatin (CRESTOR) 10 MG tablet Take 10 mg by mouth daily.    Yes Historical Provider, MD  ferrous gluconate (FERGON) 324 MG tablet Take 1 tablet (324 mg total) by mouth 2 (two) times daily with a meal. 05/17/16   Debbe Odea, MD  fluticasone (FLONASE) 50 MCG/ACT nasal spray Place 1-2 sprays into both nostrils daily as needed for allergies.  04/12/15   Historical Provider, MD    Physical Exam: Vitals:   05/30/16 1515 05/30/16 1530 05/30/16 1545 05/30/16 1600  BP: 114/58 (!) 114/53 138/80 (!) 115/53  Pulse: 99 74  61  Resp: 20 19 17 22   Temp:      TempSrc:      SpO2: 97% 100% 96% 97%      Constitutional: NAD, calm, comfortable Eyes: PERRL, lids and conjunctivae normal ENMT: Mucous membranes are moist. Posterior pharynx clear of any exudate or lesions.Normal dentition.  Neck: normal, supple, no masses, no thyromegaly Respiratory: clear to auscultation bilaterally, no wheezing, no crackles. Normal respiratory effort. No accessory muscle use.  Cardiovascular: Regular rate and rhythm, no murmurs / rubs / gallops. No extremity edema. 2+ pedal pulses. No carotid bruits.  Abdomen: no tenderness, no masses palpated. No hepatosplenomegaly. Bowel sounds positive.  Musculoskeletal: no clubbing / cyanosis. No joint deformity upper and lower extremities. Good ROM, no contractures. Normal muscle tone.  Skin: no rashes, lesions, ulcers. No induration Neurologic: CN 2-12 grossly intact. Sensation intact, DTR normal. Strength 5/5 x all 4 extremities.  Psychiatric: Normal judgment and insight. Alert and oriented x 3. Normal mood.    Labs on Admission: I have personally reviewed following labs and imaging studies  CBC:  Recent Labs Lab 05/25/16 0434 05/26/16 0418 05/30/16 1153  WBC 10.1 10.0 12.0*  HGB  8.6* 8.5* 10.4*  HCT 31.4* 30.9* 37.5  MCV 77.7* 78.0 78.1  PLT 196 194 [id number]   Basic Metabolic Panel:  Recent Labs Lab 05/25/16 0434 05/26/16 0418 05/30/16 1153  NA 140 139 138  K 4.4 4.4 4.0  CL 103 98* 104  CO2 29 29 24   GLUCOSE 176* 204* 206*  BUN 20 24* 14  CREATININE 0.68 0.69 0.63  CALCIUM 9.0 9.0 8.9   GFR: Estimated Creatinine Clearance: 69.7 mL/min (by C-G formula based on SCr of 0.63 mg/dL). Liver Function Tests:  Recent Labs  Lab 05/30/16 1153  AST 17  ALT 16  ALKPHOS 95  BILITOT 1.1  PROT 6.1*  ALBUMIN 2.8*    Recent Labs Lab 05/30/16 1153  LIPASE 19   No results for input(s): AMMONIA in the last 168 hours. Coagulation Profile: No results for input(s): INR, PROTIME in the last 168 hours. Cardiac Enzymes:  Recent Labs Lab 05/30/16 1153  TROPONINI <0.03   BNP (last 3 results) No results for input(s): PROBNP in the last 8760 hours. HbA1C: No results for input(s): HGBA1C in the last 72 hours. CBG:  Recent Labs Lab 05/25/16 1119 05/25/16 1722 05/25/16 2131 05/26/16 0628 05/26/16 1126  GLUCAP 196* 405* 340* 190* 172*   Lipid Profile: No results for input(s): CHOL, HDL, LDLCALC, TRIG, CHOLHDL, LDLDIRECT in the last 72 hours. Thyroid Function Tests: No results for input(s): TSH, T4TOTAL, FREET4, T3FREE, THYROIDAB in the last 72 hours. Anemia Panel: No results for input(s): VITAMINB12, FOLATE, FERRITIN, TIBC, IRON, RETICCTPCT in the last 72 hours. Urine analysis:    Component Value Date/Time   COLORURINE YELLOW 05/21/2016 0230   APPEARANCEUR CLEAR 05/21/2016 0230   LABSPEC 1.021 05/21/2016 0230   PHURINE 6.0 05/21/2016 0230   GLUCOSEU NEGATIVE 05/21/2016 0230   HGBUR NEGATIVE 05/21/2016 0230   BILIRUBINUR NEGATIVE 05/21/2016 0230   KETONESUR NEGATIVE 05/21/2016 0230   PROTEINUR 30 (A) 05/21/2016 0230   UROBILINOGEN 1.0 10/09/2014 0040   NITRITE NEGATIVE 05/21/2016 0230   LEUKOCYTESUR NEGATIVE 05/21/2016 0230   Sepsis  Labs: @LABRCNTIP (procalcitonin:4,lacticidven:4) ) Recent Results (from the past 240 hour(s))  Blood culture (routine x 2)     Status: None   Collection Time: 05/20/16  8:45 PM  Result Value Ref Range Status   Specimen Description BLOOD RIGHT HAND  Final   Special Requests BOTTLES DRAWN AEROBIC AND ANAEROBIC 5CC  Final   Culture NO GROWTH 5 DAYS  Final   Report Status 05/25/2016 FINAL  Final  Blood culture (routine x 2)     Status: None   Collection Time: 05/20/16  8:50 PM  Result Value Ref Range Status   Specimen Description BLOOD LEFT HAND  Final   Special Requests BOTTLES DRAWN AEROBIC AND ANAEROBIC 5CC  Final   Culture NO GROWTH 5 DAYS  Final   Report Status 05/25/2016 FINAL  Final  MRSA PCR Screening     Status: None   Collection Time: 05/20/16 10:57 PM  Result Value Ref Range Status   MRSA by PCR NEGATIVE NEGATIVE Final    Comment:        The GeneXpert MRSA Assay (FDA approved for NASAL specimens only), is one component of a comprehensive MRSA colonization surveillance program. It is not intended to diagnose MRSA infection nor to guide or monitor treatment for MRSA infections.   Urine culture     Status: Abnormal   Collection Time: 05/21/16  2:30 AM  Result Value Ref Range Status   Specimen Description URINE, CATHETERIZED  Final   Special Requests NONE  Final   Culture <10,000 COLONIES/mL INSIGNIFICANT GROWTH (A)  Final   Report Status 05/22/2016 FINAL  Final     Radiological Exams on Admission: Dg Chest 2 View  Result Date: 05/30/2016 CLINICAL DATA:  Cough, congestion, nausea, and vomiting for 1 day. EXAM: CHEST  2 VIEW COMPARISON:  05/22/2016 FINDINGS: Right PICC has been removed. The cardiac silhouette is borderline to mildly enlarged. Mild pulmonary vascular congestion has decreased. There is mild asymmetric accentuation of the interstitial markings in the left lower lobe which has  improved. No pleural effusion or pneumothorax is identified. No acute osseous  abnormality is seen. IMPRESSION: 1. Decreased pulmonary vascular congestion. 2. Mild asymmetric interstitial prominence in the left lung base may be related to mild residual vascular congestion, although atypical infection is not excluded. Electronically Signed   By: Logan Bores M.D.   On: 05/30/2016 12:52    EKG: (Independently reviewed) atrial fibrillation with ventricular rate 81 bpm, QTC 442 ms, no definitive ischemic changes and unchanged from previous EKG  Assessment/Plan Principal Problem:   Nausea & vomiting -Patient presents with solitary episode of nausea with vomiting associated with dizziness and diaphoresis after the emesis -Differential includes gastroenteritis vs vagal episode vs atypical presentation of cardiac ischemia vs severe GERD -No palpitations noted by patient so less likely related to RVR but we'll continue telemetry -Cycle cardiac enzymes; echo completed in January 2018 so unless abnormal cardiac enzymes no indication to repeat -Symptom management with anti-emetic -IV fluids noting patient appears hemoconcentrated (average hemoglobin 8.5 with current hemoglobin 10.4)-we'll also hold on Lasix -Recently received antibiotics for pneumonia but no diarrhea so doubt abdominal pain and current symptoms related to C. difficile colitis -Lear liquids for now  Active Problems:   Gastroesophageal reflux disease -Continue PPI    Abnormal chest x-ray -Recent admission for pneumonia secondary to influenza and these are likely residual changes secondary to that process -No worsening of respiratory symptoms since discharge -Was given 1 dose of Levaquin in the ER no indication to continue    Hypertension -Continue preadmission Lopressor and Cardizem    DM2 (diabetes mellitus, type 2)  -Hold preadmission metformin and Amaryl until proves can tolerate oral diet -CBGs AC/HS -SSI    Chronic atrial fibrillation  -Continue BB and CCB -Continue Xarelto -Currently rate  controlled    Rheumatoid arthritis  -Continue prednisone-if has recurrent vomiting may need to convert to IV form    Iron deficiency anemia due to chronic blood loss -Hemoglobin stable noting actual hemoconcentration in setting of presumed dehydration      DVT prophylaxis: Xarelto Code Status: Full Family Communication: No family at bedside Disposition Plan: Back to home environment Consults called: None    Jnaya Butrick L. ANP-BC [facility] Pager [phone]   If 7PM-7AM, please contact night-coverage [email or web address] Password [facility]  05/30/2016, 4:49 PM

## 2016-05-30 NOTE — Progress Notes (Signed)
New Admission Note:    Arrival Method:Stretcher from ER Mental Orientation: Alert and Oriented Assessment: To be done Skin: Intact IV: Infusing Pain:  Safety Measures: Side rails up and call bell placed in reach Admission: Not done 6E Orientation: Yes Family:  None at bedside  Orders have been reviewed and implemented.  Will continue to monitor the patient.

## 2016-05-30 NOTE — ED Notes (Signed)
Pt unable to void at this time. 

## 2016-05-30 NOTE — ED Provider Notes (Signed)
[facility] DEPT Provider Note   CSN: [id number] Arrival date & time: 05/30/16  1102     History   Chief Complaint Chief Complaint  Patient presents with  . Emesis    HPI [name] is a 71 y.o. female.  HPI  66 with a history of hypertension, hyperlipidemia, diabetes, A. Fib (on diltiazem, metoprolol, Xarelto) who presents to the ED with sudden onset of nausea, diaphoresis, lightheadedness, sensation of "just not feeling well." Patient had 3 episodes of nonbloody nonbilious emesis and approximately 15 minutes span. Denied any chest pain, shortness of breath, recent fevers, diarrhea, abdominal pain, urinary symptoms. Of note patient was recently admitted to the hospital and discharged 5 days ago for influenza. She reports an improving cough.   Past Medical History:  Diagnosis Date  . Actinic keratosis   . Anemia    hx of  . Anemia of chronic renal failure, stage 3 (moderate) 07/17/2015  . Anticoagulant causing adverse effect in therapeutic use 07/17/2015  . Anxiety   . Atrial fibrillation ([place])   . Atrial fibrillation ([place])   . Carpal tunnel syndrome   . Chest pain   . Depression   . Edema   . Gastroesophageal reflux disease   . Hepatitis 1970  . Hiatal hernia   . Hyperlipidemia   . Hypertension   . Iron deficiency anemia due to chronic blood loss 07/17/2015  . Jaundice    age 83  . Memory loss   . Peripheral vascular disease (HCC)    DVT left lower leg  . Rheumatoid arthritis(714.0)   . Thyroid disease    multi nodular goiter  . Type II diabetes mellitus [facility])     Patient Active Problem List   Diagnosis Date Noted  . Diaphoresis 05/30/2016  . Nausea & vomiting 05/30/2016  . Influenza A 05/21/2016  . Sepsis ([place]) 05/20/2016  . Atrial fibrillation with RVR ([place]) 05/20/2016  . Acute CHF (congestive heart failure) ([place]) 05/20/2016  . History of GI bleed 03/29/2016  . Chronic anticoagulation 03/29/2016  . Symptomatic anemia 03/19/2016  . Shortness of  breath   . Spinal stenosis of lumbar region 02/10/2016  . Swelling of limb 01/13/2016  . Varicose veins of left leg with edema 09/15/2015  . Preop cardiovascular exam 08/25/2015  . Iron deficiency anemia due to chronic blood loss 07/17/2015  . Anemia of chronic renal failure, stage 3 (moderate) 07/17/2015  . Anticoagulant causing adverse effect in therapeutic use 07/17/2015  . Symptomatic bradycardia 02/21/2015  . Bradycardia   . Acute deep vein thrombosis (DVT) of left lower extremity ([place]) 05/06/2014  . Abdominal wall mass of right lower quadrant 07/08/2013  . OA (osteoarthritis) of hip 04/03/2013  . Osteoarthritis of hip 01/18/2013  . Night sweats 09/06/2012  . Chest pain   . Permanent atrial fibrillation ([place])   . Anxiety and depression   . Gastroesophageal reflux disease   . Hepatitis   . Jaundice   . Hypertension 07/01/2010  . Hypercholesterolemia 07/01/2010  . DM2 (diabetes mellitus, type 2) ([place]) 07/01/2010  . Rheumatoid arthritis ([place]) 07/01/2010  . Sinus tachycardia 07/01/2010  . Chest pain, atypical 07/01/2010    Past Surgical History:  Procedure Laterality Date  . CATARACT EXTRACTION Left   . COLONOSCOPY WITH PROPOFOL N/A 02/20/2015   Procedure: COLONOSCOPY WITH PROPOFOL;  Surgeon: Carol Ada, MD;  Location: [facility];  Service: Endoscopy;  Laterality: N/A;  . ENTEROSCOPY N/A 03/23/2016   Procedure: ENTEROSCOPY;  Surgeon: Carol Ada, MD;  Location: [facility];  Service: Endoscopy;  Laterality: N/A;  . ESOPHAGOGASTRODUODENOSCOPY (EGD) WITH PROPOFOL N/A 02/20/2015   Procedure: ESOPHAGOGASTRODUODENOSCOPY (EGD) WITH PROPOFOL;  Surgeon: Carol Ada, MD;  Location: [facility];  Service: Endoscopy;  Laterality: N/A;  . GIVENS CAPSULE STUDY N/A 03/21/2016   Procedure: GIVENS CAPSULE STUDY;  Surgeon: Carol Ada, MD;  Location: [facility];  Service: Endoscopy;  Laterality: N/A;  . HAND SURGERY  1995 and 1996   artificial joints both hands  . JOINT  REPLACEMENT    . LUMBAR LAMINECTOMY/DECOMPRESSION MICRODISCECTOMY N/A 02/10/2016   Procedure: MICROLUMBAR DECOMPRESSION L4-L5 AND L3- L4, AND EXCISION OF SYNOVIAL CYST L4-L5;  Surgeon: Susa Day, MD;  Location: [facility];  Service: Orthopedics;  Laterality: N/A;  . TOTAL HIP ARTHROPLASTY Left 2009  . TOTAL HIP ARTHROPLASTY Right 04/03/2013   Procedure: RIGHT TOTAL HIP ARTHROPLASTY ANTERIOR APPROACH;  Surgeon: Gearlean Alf, MD;  Location: [facility];  Service: Orthopedics;  Laterality: Right;    OB History    No data available       Home Medications    Prior to Admission medications   Medication Sig Start Date End Date Taking? Authorizing Provider  Abatacept (ORENCIA CLICKJECT) 0000000 MG/ML SOAJ Inject 125 mg into the skin once a week.     Historical Provider, MD  buPROPion (WELLBUTRIN SR) 150 MG 12 hr tablet Take 150 mg by mouth 2 (two) times daily.      Historical Provider, MD  Cholecalciferol (VITAMIN D3) 1000 units CAPS Take 1,000 Units by mouth daily.    Historical Provider, MD  Cyanocobalamin (VITAMIN B 12 PO) Take 1,000 mcg by mouth daily.     Historical Provider, MD  diltiazem (CARDIZEM CD) 360 MG 24 hr capsule Take 1 capsule (360 mg total) by mouth daily. 05/11/16   Erlene Quan, PA-C  esomeprazole (NEXIUM) 40 MG capsule Take 40 mg by mouth daily.      Historical Provider, MD  ferrous gluconate (FERGON) 324 MG tablet Take 1 tablet (324 mg total) by mouth 2 (two) times daily with a meal. 05/17/16   Debbe Odea, MD  fluticasone (FLONASE) 50 MCG/ACT nasal spray Place 1-2 sprays into both nostrils daily as needed for allergies.  04/12/15   Historical Provider, MD  furosemide (LASIX) 20 MG tablet TAKE 2 TABLETS DAILY 04/05/16   Lelon Perla, MD  glimepiride (AMARYL) 2 MG tablet Take 2 mg by mouth daily with breakfast.  11/03/14   Historical Provider, MD  metFORMIN (GLUCOPHAGE-XR) 500 MG 24 hr tablet Take 1,000 mg by mouth 2 (two) times daily. 09/02/14   Historical Provider, MD  metoprolol  (LOPRESSOR) 50 MG tablet Take 2 tablets (100 mg total) by mouth 2 (two) times daily. 05/11/16   Erlene Quan, PA-C  Multiple Vitamins-Minerals (PRESERVISION AREDS) CAPS Take 1 capsule by mouth 2 (two) times daily.    Historical Provider, MD  nitroGLYCERIN (NITROSTAT) 0.4 MG SL tablet Place 0.4 mg under the tongue every 5 (five) minutes as needed for chest pain (x 3 doses).     Historical Provider, MD  Omega 3 1200 MG CAPS Take 1 capsule by mouth daily.    Historical Provider, MD  potassium chloride (KLOR-CON 10) 10 MEQ tablet Take 2 tablets (20 mEq total) by mouth 2 (two) times daily. 01/04/16   Lelon Perla, MD  predniSONE (DELTASONE) 5 MG tablet Take 5 mg by mouth daily.    Historical Provider, MD  rivaroxaban (XARELTO) 20 MG TABS tablet Take 20 mg by mouth daily  with supper.    Historical Provider, MD  rosuvastatin (CRESTOR) 10 MG tablet Take 10 mg by mouth daily.     Historical Provider, MD    Family History Family History  Problem Relation Age of Onset  . Stroke Father   . Heart failure Sister   . Hypertension Sister   . Heart attack Neg Hx     Social History Social History  Substance Use Topics  . Smoking status: Never Smoker  . Smokeless tobacco: Never Used  . Alcohol use No     Allergies   Patient has no known allergies.   Review of Systems Review of Systems Ten systems are reviewed and are negative for acute change except as noted in the HPI   Physical Exam Updated Vital Signs BP (!) 115/53   Pulse 61   Temp 97.8 F (36.6 C) (Oral)   Resp 22   SpO2 97%   Physical Exam  Constitutional: She is oriented to person, place, and time. She appears well-developed and well-nourished. No distress.  HENT:  Head: Normocephalic and atraumatic.  Nose: Nose normal.  Eyes: Conjunctivae and EOM are normal. Pupils are equal, round, and reactive to light. Right eye exhibits no discharge. Left eye exhibits no discharge. No scleral icterus.  Neck: Normal range of motion. Neck  supple.  Cardiovascular: Normal rate and regular rhythm.  Exam reveals no gallop and no friction rub.   No murmur heard. Pulmonary/Chest: Effort normal and breath sounds normal. No stridor. No respiratory distress. She has no rales.  Abdominal: Soft. She exhibits no distension. There is no tenderness.  Musculoskeletal: She exhibits no edema or tenderness.  Neurological: She is alert and oriented to person, place, and time.  Skin: Skin is warm and dry. No rash noted. She is not diaphoretic. No erythema.  Psychiatric: She has a normal mood and affect.  Vitals reviewed.    ED Treatments / Results  Labs (all labs ordered are listed, but only abnormal results are displayed) Labs Reviewed  COMPREHENSIVE METABOLIC PANEL - Abnormal; Notable for the following:       Result Value   Glucose, Bld 206 (*)    Total Protein 6.1 (*)    Albumin 2.8 (*)    All other components within normal limits  CBC - Abnormal; Notable for the following:    WBC 12.0 (*)    Hemoglobin 10.4 (*)    MCH 21.7 (*)    MCHC 27.7 (*)    RDW 26.3 (*)    All other components within normal limits  LIPASE, BLOOD  TROPONIN I  URINALYSIS, ROUTINE W REFLEX MICROSCOPIC  TROPONIN I  TROPONIN I  TROPONIN I    EKG  EKG Interpretation  Date/Time:  Monday May 30 2016 11:03:31 EST Ventricular Rate:  81 PR Interval:    QRS Duration: 84 QT Interval:  380 QTC Calculation: 442 R Axis:   40 Text Interpretation:  Atrial fibrillation Borderline repolarization abnormality No significant change since last tracing Confirmed by [facility] MD, PEDRO ([id number]) on 05/30/2016 12:52:15 PM       Radiology Dg Chest 2 View  Result Date: 05/30/2016 CLINICAL DATA:  Cough, congestion, nausea, and vomiting for 1 day. EXAM: CHEST  2 VIEW COMPARISON:  05/22/2016 FINDINGS: Right PICC has been removed. The cardiac silhouette is borderline to mildly enlarged. Mild pulmonary vascular congestion has decreased. There is mild asymmetric  accentuation of the interstitial markings in the left lower lobe which has improved. No pleural effusion or pneumothorax is  identified. No acute osseous abnormality is seen. IMPRESSION: 1. Decreased pulmonary vascular congestion. 2. Mild asymmetric interstitial prominence in the left lung base may be related to mild residual vascular congestion, although atypical infection is not excluded. Electronically Signed   By: Logan Bores M.D.   On: 05/30/2016 12:52    Procedures Procedures (including critical care time)  Medications Ordered in ED Medications  sodium chloride 0.9 % bolus 1,000 mL (0 mLs Intravenous Stopped 05/30/16 1542)  ondansetron (ZOFRAN) injection 4 mg (4 mg Intravenous Given 05/30/16 1304)     Initial Impression / Assessment and Plan / ED Course  I have reviewed the triage vital signs and the nursing notes.  Pertinent labs & imaging results that were available during my care of the patient were reviewed by me and considered in my medical decision making (see chart for details).     Concerning the patient's symptom etiology. Chest x-ray without overwhelming evidence of pneumonia. No additional fevers since recent hospitalization for influenza. Patient does have improving cough since recent influenza infection. Leukocytosis on CBC. We'll cover with Levaquin for possible post viral PNA.   Given the sudden onset of patient's symptoms and advanced age, I have a concern for cardiac etiology. EKG without acute ischemic changes or evidence of pericarditis. Initial troponin negative. We'll elect to admit patient for ACS rule out.  Appreciate hospitalist for admission.  Final Clinical Impressions(s) / ED Diagnoses   Final diagnoses:  Non-intractable vomiting with nausea, unspecified vomiting type  Diaphoresis  Cough      Fatima Blank, MD 05/30/16 1610

## 2016-05-30 NOTE — ED Notes (Signed)
Attempted report x2. Floor RN to call back.

## 2016-05-30 NOTE — ED Triage Notes (Signed)
Pt from home with EMS for nausea and vomiting. Pt was given 4 mg of zofran with EMS for  Vomiting. Pt had sudden onset this morning around 9am.

## 2016-05-31 DIAGNOSIS — E1151 Type 2 diabetes mellitus with diabetic peripheral angiopathy without gangrene: Secondary | ICD-10-CM | POA: Diagnosis present

## 2016-05-31 DIAGNOSIS — R938 Abnormal findings on diagnostic imaging of other specified body structures: Secondary | ICD-10-CM | POA: Diagnosis not present

## 2016-05-31 DIAGNOSIS — Z79899 Other long term (current) drug therapy: Secondary | ICD-10-CM | POA: Diagnosis not present

## 2016-05-31 DIAGNOSIS — E11319 Type 2 diabetes mellitus with unspecified diabetic retinopathy without macular edema: Secondary | ICD-10-CM | POA: Diagnosis not present

## 2016-05-31 DIAGNOSIS — I10 Essential (primary) hypertension: Secondary | ICD-10-CM | POA: Diagnosis not present

## 2016-05-31 DIAGNOSIS — Z7901 Long term (current) use of anticoagulants: Secondary | ICD-10-CM | POA: Diagnosis not present

## 2016-05-31 DIAGNOSIS — J181 Lobar pneumonia, unspecified organism: Secondary | ICD-10-CM | POA: Diagnosis not present

## 2016-05-31 DIAGNOSIS — Z7984 Long term (current) use of oral hypoglycemic drugs: Secondary | ICD-10-CM | POA: Diagnosis not present

## 2016-05-31 DIAGNOSIS — I482 Chronic atrial fibrillation: Secondary | ICD-10-CM | POA: Diagnosis not present

## 2016-05-31 DIAGNOSIS — M069 Rheumatoid arthritis, unspecified: Secondary | ICD-10-CM | POA: Diagnosis present

## 2016-05-31 DIAGNOSIS — D500 Iron deficiency anemia secondary to blood loss (chronic): Secondary | ICD-10-CM | POA: Diagnosis present

## 2016-05-31 DIAGNOSIS — R112 Nausea with vomiting, unspecified: Secondary | ICD-10-CM | POA: Diagnosis present

## 2016-05-31 DIAGNOSIS — R079 Chest pain, unspecified: Secondary | ICD-10-CM | POA: Diagnosis not present

## 2016-05-31 DIAGNOSIS — J129 Viral pneumonia, unspecified: Secondary | ICD-10-CM | POA: Diagnosis present

## 2016-05-31 DIAGNOSIS — E860 Dehydration: Secondary | ICD-10-CM | POA: Diagnosis present

## 2016-05-31 DIAGNOSIS — K219 Gastro-esophageal reflux disease without esophagitis: Secondary | ICD-10-CM | POA: Diagnosis present

## 2016-05-31 DIAGNOSIS — Z96643 Presence of artificial hip joint, bilateral: Secondary | ICD-10-CM | POA: Diagnosis present

## 2016-05-31 DIAGNOSIS — Z86718 Personal history of other venous thrombosis and embolism: Secondary | ICD-10-CM | POA: Diagnosis not present

## 2016-05-31 DIAGNOSIS — A084 Viral intestinal infection, unspecified: Secondary | ICD-10-CM | POA: Diagnosis present

## 2016-05-31 DIAGNOSIS — E7800 Pure hypercholesterolemia, unspecified: Secondary | ICD-10-CM | POA: Diagnosis present

## 2016-05-31 LAB — URINALYSIS, ROUTINE W REFLEX MICROSCOPIC
Bacteria, UA: NONE SEEN
Bilirubin Urine: NEGATIVE
Glucose, UA: NEGATIVE mg/dL
Hgb urine dipstick: NEGATIVE
Ketones, ur: NEGATIVE mg/dL
Leukocytes, UA: NEGATIVE
Nitrite: NEGATIVE
Protein, ur: NEGATIVE mg/dL
Specific Gravity, Urine: 1.013 (ref 1.005–1.030)
pH: 7 (ref 5.0–8.0)

## 2016-05-31 LAB — GLUCOSE, CAPILLARY
GLUCOSE-CAPILLARY: 153 mg/dL — AB (ref 65–99)
GLUCOSE-CAPILLARY: 162 mg/dL — AB (ref 65–99)
GLUCOSE-CAPILLARY: 187 mg/dL — AB (ref 65–99)
GLUCOSE-CAPILLARY: 145 mg/dL — AB (ref 65–99)

## 2016-05-31 LAB — COMPREHENSIVE METABOLIC PANEL
ALT: 13 U/L — AB (ref 14–54)
AST: 16 U/L (ref 15–41)
Albumin: 2.3 g/dL — ABNORMAL LOW (ref 3.5–5.0)
Alkaline Phosphatase: 84 U/L (ref 38–126)
Anion gap: 6 (ref 5–15)
BILIRUBIN TOTAL: 0.9 mg/dL (ref 0.3–1.2)
BUN: 8 mg/dL (ref 6–20)
CO2: 25 mmol/L (ref 22–32)
CREATININE: 0.6 mg/dL (ref 0.44–1.00)
Calcium: 8.7 mg/dL — ABNORMAL LOW (ref 8.9–10.3)
Chloride: 109 mmol/L (ref 101–111)
GFR calc Af Amer: >60 mL/min (ref >60)
Glucose, Bld: 82 mg/dL (ref 65–99)
POTASSIUM: 4.5 mmol/L (ref 3.5–5.1)
Sodium: 140 mmol/L (ref 135–145)
TOTAL PROTEIN: 5.7 g/dL — AB (ref 6.5–8.1)

## 2016-05-31 LAB — CBC
HCT: 34.3 % — ABNORMAL LOW (ref 36.0–46.0)
Hemoglobin: 9.7 g/dL — ABNORMAL LOW (ref 12.0–15.0)
MCH: 22.1 pg — ABNORMAL LOW (ref 26.0–34.0)
MCHC: 28.3 g/dL — ABNORMAL LOW (ref 30.0–36.0)
MCV: 78.3 fL (ref 78.0–100.0)
PLATELETS: 296 10*3/uL (ref 150–400)
RBC: 4.38 MIL/uL (ref 3.87–5.11)
RDW: 26 % — AB (ref 11.5–15.5)
WBC: 10.7 10*3/uL — AB (ref 4.0–10.5)

## 2016-05-31 LAB — TROPONIN I

## 2016-05-31 LAB — PROCALCITONIN: Procalcitonin: <0.1 ng/mL

## 2016-05-31 MED ORDER — HYDROCODONE-HOMATROPINE 5-1.5 MG/5ML PO SYRP: 5.0000 mL | ORAL_SOLUTION | ORAL | Status: DC | PRN

## 2016-05-31 MED ORDER — DEXTROSE 5 % IV SOLN
500.0000 mg | INTRAVENOUS | Status: DC
  Administered 2016-05-31: 500 mg via INTRAVENOUS
  Filled 2016-05-31 (×2): qty 500

## 2016-05-31 MED ORDER — LEVALBUTEROL HCL 1.25 MG/0.5ML IN NEBU: 1.2500 mg | INHALATION_SOLUTION | Freq: Four times a day (QID) | RESPIRATORY_TRACT | Status: DC | PRN

## 2016-05-31 NOTE — Progress Notes (Signed)
PROGRESS NOTE [facility]   [name]   [id number] DOB: 1946/02/20  DOA: 05/30/2016 PCP: Tula Nakayama   Brief Narrative:  71 year old female with medical history significant for A. fib, hypertension, diabetes, RA on chronic steroids and iron deficiency anemia recently admitted 2/16 due to influenza A. Patient presented to the emergency department and planing of nausea and vomiting and persistent with some cough with clear sputum. To me patient reported that she had some degree of difficulty breathing, associated with dizziness and diaphoresis. Patient was placed in observation for IV fluids and monitoring.   Subjective: Patient seen and examined. Patient continues to c/o of severe cough with nausea and vomiting. Patient also report mild difficulty breathing. No chest pain or palpitations.   Assessment & Plan: CAP - CXR concerning of atypical infection. In view of recent Influenza infection - postviral pneumonia. Clinically with cough, dyspnea and chills.  Will treat with Azithromycin 500 mg x 3 days  Will add Xopenex  Will get procalcitonin O2 PRN  Will add hycodan PRN cough  If spike fever obtain blood cultures   Dizziness - likely 2/2 dehydration in setting of low BP and vomiting  IVF  Monitor BP  Check orthostatics  Nausea and vomiting - probable viral gastroenteritis  Supportive treatment  Advance diet as tolerated   Hypertension - BP initially low, now stabilizing  Continue home meds  DM type 2 - CBG's stable  SSI  Monitor   Chronic Afib - Rate and rhythm controlled  Continue Xarelto, BB and Cardizem    DVT prophylaxis: Xarelto  Code Status: FULL  Family Communication: None at bedside  Disposition Plan: If tolerating PO and BP improve will d/c in AM   Consultants:   None  Procedures:   None  Antimicrobials:  Azithromycin 05/31/16    Objective: Vitals:   05/30/16 2020 05/30/16 2344 05/31/16 0550 05/31/16 0949  BP: 132/61  (!)  120/53 130/77  Pulse: 87  69 69  Resp: 18  18 16   Temp: 97.7 F (36.5 C)  97.6 F (36.4 C) 98.1 F (36.7 C)  TempSrc: Oral  Oral Oral  SpO2: 99%  99% 99%  Weight:  78.4 kg (172 lb 13.5 oz)      Intake/Output Summary (Last 24 hours) at 05/31/16 1639 Last data filed at 05/31/16 1452  Gross per 24 hour  Intake          2808.25 ml  Output             1500 ml  Net          1308.25 ml   Filed Weights   05/30/16 2344  Weight: 78.4 kg (172 lb 13.5 oz)    Examination:  General exam: NAD HEENT: OP clear  Respiratory system: Coarse breaths sound prominent at the left lower base. Wet cough  Cardiovascular system: S1S2 RRR no murmurs  Gastrointestinal system: Abdomen is nondistended, soft and mild epigastric tenderness  Central nervous system: Alert and oriented.  Extremities: No pedal edema. Skin: No rashes, lesions or ulcers Psychiatry: Judgement and insight appear normal. Mood & affect appropriate.    Data Reviewed: I have personally reviewed following labs and imaging studies  CBC:  Recent Labs Lab 05/25/16 0434 05/26/16 0418 05/30/16 1153 05/31/16 0400  WBC 10.1 10.0 12.0* 10.7*  HGB 8.6* 8.5* 10.4* 9.7*  HCT 31.4* 30.9* 37.5 34.3*  MCV 77.7* 78.0 78.1 78.3  PLT 196 194 292 0000000   Basic Metabolic Panel:  Recent Labs Lab  05/25/16 0434 05/26/16 0418 05/30/16 1153 05/31/16 0400  NA 140 139 138 140  K 4.4 4.4 4.0 4.5  CL 103 98* 104 109  CO2 29 29 24 25   GLUCOSE 176* 204* 206* 82  BUN 20 24* 14 8  CREATININE 0.68 0.69 0.63 0.60  CALCIUM 9.0 9.0 8.9 8.7*   GFR: Estimated Creatinine Clearance: 69.5 mL/min (by C-G formula based on SCr of 0.6 mg/dL). Liver Function Tests:  Recent Labs Lab 05/30/16 1153 05/31/16 0400  AST 17 16  ALT 16 13*  ALKPHOS 95 84  BILITOT 1.1 0.9  PROT 6.1* 5.7*  ALBUMIN 2.8* 2.3*    Recent Labs Lab 05/30/16 1153  LIPASE 19   No results for input(s): AMMONIA in the last 168 hours. Coagulation Profile: No results for  input(s): INR, PROTIME in the last 168 hours. Cardiac Enzymes:  Recent Labs Lab 05/30/16 1153 05/30/16 1708 05/30/16 2125 05/31/16 0400  TROPONINI <0.03 <0.03 <0.03 <0.03   BNP (last 3 results) No results for input(s): PROBNP in the last 8760 hours. HbA1C: No results for input(s): HGBA1C in the last 72 hours. CBG:  Recent Labs Lab 05/26/16 1126 05/30/16 1708 05/30/16 2309 05/31/16 0758 05/31/16 1221  GLUCAP 172* 149* 79 153* 162*   Lipid Profile: No results for input(s): CHOL, HDL, LDLCALC, TRIG, CHOLHDL, LDLDIRECT in the last 72 hours. Thyroid Function Tests: No results for input(s): TSH, T4TOTAL, FREET4, T3FREE, THYROIDAB in the last 72 hours. Anemia Panel: No results for input(s): VITAMINB12, FOLATE, FERRITIN, TIBC, IRON, RETICCTPCT in the last 72 hours. Sepsis Labs: No results for input(s): PROCALCITON, LATICACIDVEN in the last 168 hours.  No results found for this or any previous visit (from the past 240 hour(s)).    Radiology Studies: Dg Chest 2 View  Result Date: 05/30/2016 CLINICAL DATA:  Cough, congestion, nausea, and vomiting for 1 day. EXAM: CHEST  2 VIEW COMPARISON:  05/22/2016 FINDINGS: Right PICC has been removed. The cardiac silhouette is borderline to mildly enlarged. Mild pulmonary vascular congestion has decreased. There is mild asymmetric accentuation of the interstitial markings in the left lower lobe which has improved. No pleural effusion or pneumothorax is identified. No acute osseous abnormality is seen. IMPRESSION: 1. Decreased pulmonary vascular congestion. 2. Mild asymmetric interstitial prominence in the left lung base may be related to mild residual vascular congestion, although atypical infection is not excluded. Electronically Signed   By: Logan Bores M.D.   On: 05/30/2016 12:52     Scheduled Meds: . buPROPion  150 mg Oral BID  . diltiazem  360 mg Oral Daily  . insulin aspart  0-5 Units Subcutaneous QHS  . insulin aspart  0-9 Units  Subcutaneous TID WC  . metoprolol  100 mg Oral BID  . pantoprazole  40 mg Oral Daily  . predniSONE  5 mg Oral Q breakfast  . rivaroxaban  20 mg Oral Q supper  . sodium chloride flush  3 mL Intravenous Q12H   Continuous Infusions: . sodium chloride 75 mL/hr at 05/31/16 0442     LOS: 0 days    Chipper Oman, MD Pager: Text Page via [email or web address]  [phone]  If 7PM-7AM, please contact night-coverage [email or web address] Password [facility] 05/31/2016, 4:39 PM

## 2016-05-31 NOTE — Care Management Obs Status (Signed)
[place] NOTIFICATION   Patient Details  Name: [name] MRN: [id number] Date of Birth: 08-25-1945   Medicare Observation Status Notification Given:  Yes    Carles Collet, RN 05/31/2016, 11:04 AM

## 2016-06-01 ENCOUNTER — Inpatient Hospital Stay (HOSPITAL_COMMUNITY): Payer: Medicare Other

## 2016-06-01 DIAGNOSIS — R938 Abnormal findings on diagnostic imaging of other specified body structures: Secondary | ICD-10-CM

## 2016-06-01 DIAGNOSIS — I482 Chronic atrial fibrillation: Secondary | ICD-10-CM

## 2016-06-01 DIAGNOSIS — R112 Nausea with vomiting, unspecified: Secondary | ICD-10-CM

## 2016-06-01 DIAGNOSIS — E11319 Type 2 diabetes mellitus with unspecified diabetic retinopathy without macular edema: Secondary | ICD-10-CM

## 2016-06-01 LAB — BASIC METABOLIC PANEL
Anion gap: 6 (ref 5–15)
BUN: 5 mg/dL — AB (ref 6–20)
CO2: 23 mmol/L (ref 22–32)
Calcium: 8.5 mg/dL — ABNORMAL LOW (ref 8.9–10.3)
Chloride: 111 mmol/L (ref 101–111)
Creatinine, Ser: 0.7 mg/dL (ref 0.44–1.00)
GFR calc Af Amer: >60 mL/min (ref >60)
Glucose, Bld: 140 mg/dL — ABNORMAL HIGH (ref 65–99)
POTASSIUM: 3.7 mmol/L (ref 3.5–5.1)
SODIUM: 140 mmol/L (ref 135–145)

## 2016-06-01 LAB — CBC
HCT: 32 % — ABNORMAL LOW (ref 36.0–46.0)
Hemoglobin: 8.9 g/dL — ABNORMAL LOW (ref 12.0–15.0)
MCH: 21.8 pg — ABNORMAL LOW (ref 26.0–34.0)
MCHC: 27.8 g/dL — ABNORMAL LOW (ref 30.0–36.0)
MCV: 78.2 fL (ref 78.0–100.0)
Platelets: 322 10*3/uL (ref 150–400)
RBC: 4.09 MIL/uL (ref 3.87–5.11)
RDW: 26 % — ABNORMAL HIGH (ref 11.5–15.5)
WBC: 9.2 10*3/uL (ref 4.0–10.5)

## 2016-06-01 LAB — GLUCOSE, CAPILLARY: Glucose-Capillary: 111 mg/dL — ABNORMAL HIGH (ref 65–99)

## 2016-06-01 MED ORDER — INSULIN ASPART 100 UNIT/ML ~~LOC~~ SOLN: 0.0000 [IU] | Freq: Every day | SUBCUTANEOUS | 11 refills | Status: DC

## 2016-06-01 MED ORDER — AZITHROMYCIN 500 MG PO TABS: 500.0000 mg | ORAL_TABLET | Freq: Every day | ORAL | 0 refills | Status: AC

## 2016-06-01 NOTE — Progress Notes (Signed)
[name] to be D/C'd Home per MD order.  Discussed prescriptions and follow up appointments with the patient. Prescriptions given to patient, medication list explained in detail. Pt verbalized understanding.  Allergies as of 06/01/2016   No Known Allergies     Medication List    TAKE these medications   azithromycin 500 MG tablet Commonly known as:  ZITHROMAX Take 1 tablet (500 mg total) by mouth daily.   buPROPion 150 MG 12 hr tablet Commonly known as:  WELLBUTRIN SR Take 150 mg by mouth 2 (two) times daily.   diltiazem 360 MG 24 hr capsule Commonly known as:  CARDIZEM CD Take 1 capsule (360 mg total) by mouth daily.   esomeprazole 40 MG capsule Commonly known as:  NEXIUM Take 40 mg by mouth daily.   ferrous gluconate 324 MG tablet Commonly known as:  FERGON Take 1 tablet (324 mg total) by mouth 2 (two) times daily with a meal.   furosemide 20 MG tablet Commonly known as:  LASIX TAKE 2 TABLETS DAILY   glimepiride 2 MG tablet Commonly known as:  AMARYL Take 2 mg by mouth daily with breakfast.   insulin aspart 100 UNIT/ML injection Commonly known as:  novoLOG Inject 0-5 Units into the skin at bedtime.   metFORMIN 500 MG 24 hr tablet Commonly known as:  GLUCOPHAGE-XR Take 1,000 mg by mouth 2 (two) times daily.   metoprolol 50 MG tablet Commonly known as:  LOPRESSOR Take 2 tablets (100 mg total) by mouth 2 (two) times daily.   nitroGLYCERIN 0.4 MG SL tablet Commonly known as:  NITROSTAT Place 0.4 mg under the tongue every 5 (five) minutes as needed for chest pain (x 3 doses).   Omega 3 1200 MG Caps Take 1,200 mg by mouth daily.   ORENCIA CLICKJECT 0000000 MG/ML Soaj Generic drug:  Abatacept Inject 125 mg into the skin every Thursday.   potassium chloride 10 MEQ tablet Commonly known as:  KLOR-CON 10 Take 2 tablets (20 mEq total) by mouth 2 (two) times daily.   predniSONE 5 MG tablet Commonly known as:  DELTASONE Take 5 mg by mouth daily.    PRESERVISION AREDS 2 Caps Take 1 capsule by mouth 2 (two) times daily.   rivaroxaban 20 MG Tabs tablet Commonly known as:  XARELTO Take 20 mg by mouth daily with supper.   rosuvastatin 10 MG tablet Commonly known as:  CRESTOR Take 10 mg by mouth daily.   VITAMIN B 12 PO Take 1,000 mcg by mouth daily.   Vitamin D3 1000 units Caps Take 1,000 Units by mouth daily.       Vitals:   06/01/16 0409 06/01/16 0932  BP: (!) 119/58 (!) 124/52  Pulse: 67 (!) 58  Resp: 16 14  Temp: 98.7 F (37.1 C) 98.1 F (36.7 C)    Skin clean, dry and intact without evidence of skin break down, no evidence of skin tears noted. IV catheter discontinued intact. Site without signs and symptoms of complications. Dressing and pressure applied. Pt denies pain at this time. No complaints noted.  An After Visit Summary was printed and given to the patient. Patient escorted via [place], and D/C home via private auto.  Haywood Lasso BSN, RN [facility] Phone [phone]

## 2016-06-01 NOTE — Evaluation (Signed)
Physical Therapy Evaluation Patient Details Name: [name] MRN: [id number] DOB: 10/07/1945 Today's Date: 06/01/2016   History of Present Illness  Pt is a 71 y/o female admitted secondary to nausea/vomitting. PMH including but not limited to A-fib, DM, HTN, PVD, RA, L THA in 2009 and R THA in 2014. Of note, pt has had 4 admissions in the last 6 months.   Clinical Impression  Pt presented sitting EOB when therapist entered room. Pt reported that she was being d/c'd home today. Prior to admission, pt reported that she ambulated with use of SPC and was independent with all ADLs. Pt ambulated without an AD this session, with moderate unsteadiness and LOB x2 requiring min A to maintain upright standing. Pt would benefit from using an AD to improve stability with standing and gait. Pt would continue to benefit from skilled physical therapy services at this time while admitted and after d/c to address her below listed limitations in order to improve her overall safety and independence with functional mobility.      Follow Up Recommendations Home health PT;Supervision - Intermittent    Equipment Recommendations  None recommended by PT    Recommendations for Other Services       Precautions / Restrictions Precautions Precautions: Fall Restrictions Weight Bearing Restrictions: No      Mobility  Bed Mobility Overal bed mobility: Modified Independent             General bed mobility comments: Sitting EOB upon arrival  Transfers Overall transfer level: Needs assistance Equipment used: None Transfers: Sit to/from Stand Sit to Stand: Supervision         General transfer comment: increased time, supervision for safety  Ambulation/Gait Ambulation/Gait assistance: Min guard;Min assist Ambulation Distance (Feet): 100 Feet Assistive device: None;1 person hand held assist Gait Pattern/deviations: Step-through pattern;Decreased stride length Gait velocity: decreased Gait  velocity interpretation: Below normal speed for age/gender General Gait Details: pt ambulating without AD; however, would be much safer and have improved stability with some sort of AD. pt with LOB x2 laterally, requiring min A to maintain upright standing position  Stairs            Wheelchair Mobility    Modified Rankin (Stroke Patients Only)       Balance Overall balance assessment: Needs assistance Sitting-balance support: Feet supported Sitting balance-Leahy Scale: Good     Standing balance support: During functional activity;No upper extremity supported Standing balance-Leahy Scale: Fair Standing balance comment: fair static standing with min guard                             Pertinent Vitals/Pain Pain Assessment: No/denies pain    Home Living Family/patient expects to be discharged to:: Private residence Living Arrangements: Spouse/significant other;Children Available Help at Discharge: Family Type of Home: Mobile home Home Access: Stairs to enter Entrance Stairs-Rails: Right;Left;Can reach both Entrance Stairs-Number of Steps: 3 Home Layout: One level Home Equipment: Cane - single point;Walker - 2 wheels;Walker - 4 wheels;Bedside commode;Grab bars - tub/shower      Prior Function Level of Independence: Independent with assistive device(s)   Gait / Transfers Assistance Needed: pt reported that she frequently ambulates with use of SPC           Hand Dominance        Extremity/Trunk Assessment   Upper Extremity Assessment Upper Extremity Assessment: Overall WFL for tasks assessed    Lower Extremity Assessment Lower Extremity Assessment:  Generalized weakness    Cervical / Trunk Assessment Cervical / Trunk Assessment: Normal  Communication   Communication: No difficulties  Cognition Arousal/Alertness: Awake/alert Behavior During Therapy: WFL for tasks assessed/performed Overall Cognitive Status: Within Functional Limits for tasks  assessed                      General Comments      Exercises     Assessment/Plan    PT Assessment Patient needs continued PT services  PT Problem List Decreased balance;Decreased mobility;Decreased coordination;Decreased safety awareness;Decreased knowledge of use of DME       PT Treatment Interventions DME instruction;Gait training;Stair training;Functional mobility training;Therapeutic activities;Therapeutic exercise;Balance training;Neuromuscular re-education;Patient/family education    PT Goals (Current goals can be found in the Care Plan section)  Acute Rehab PT Goals Patient Stated Goal: return home today PT Goal Formulation: With patient Time For Goal Achievement: 06/15/16 Potential to Achieve Goals: Good    Frequency Min 3X/week   Barriers to discharge        Co-evaluation               End of Session Equipment Utilized During Treatment: Gait belt Activity Tolerance: Patient tolerated treatment well Patient left: in bed;with call bell/phone within reach (sitting EOB) Nurse Communication: Mobility status PT Visit Diagnosis: Unsteadiness on feet (R26.81)         Time: [id number] PT Time Calculation (min) (ACUTE ONLY): 10 min   Charges:   PT Evaluation $PT Eval Low Complexity: 1 Procedure     PT G CodesClearnce Sorrel Shah Insley 06/01/2016, 2:38 PM Sherie Don, Wilder, DPT [phone]

## 2016-06-01 NOTE — Discharge Summary (Signed)
Physician Discharge Summary  [name] [id number] DOB: 06-18-1945 DOA: 05/30/2016  PCP: Tula Nakayama  Admit date: 05/30/2016 Discharge date: 06/01/2016  Admitted From: [facility].  Disposition:  Home.   Recommendations for Outpatient Follow-up:  1. Follow up with PCP in 1-2 weeks 2. Please obtain BMP/CBC in one week   Discharge Condition:stable.  CODE STATUS:full code Diet recommendation: Heart Healthy / Carb Modified  Brief/Interim Summary:  71 year old female with medical history significant for A. fib, hypertension, diabetes, RA on chronic steroids and iron deficiency anemia recently admitted 2/16 due to influenza A. Patient presented to the emergency department and planing of nausea and vomiting and persistent with some cough with clear sputum. To me patient reported that she had some degree of difficulty breathing, associated with dizziness and diaphoresis. Patient was placed in observation for IV fluids and IV zithromax.  Repeat CXR looks stable.  Discharge Diagnoses:  Principal Problem:   Nausea & vomiting Active Problems:   Hypertension   DM2 (diabetes mellitus, type 2) (HCC)   Rheumatoid arthritis (HCC)   Gastroesophageal reflux disease   Iron deficiency anemia due to chronic blood loss   Chronic atrial fibrillation (HCC)   Abnormal chest x-ray   Intractable nausea and vomiting   CAP - CXR concerning of atypical infection. In view of recent Influenza infection - postviral pneumonia. Clinically with cough, dyspnea and chills.  Treat with Azithromycin 500 mg x 3 days   Dizziness - likely 2/2 dehydration in setting of low BP and vomiting  IVF  Monitor BP    Nausea and vomiting - probable viral gastroenteritis  Supportive treatment  Advance diet as tolerated   Hypertension -well controlled, resume home meds.   DM type 2 - CBG's stable  CBG (last 3)   Recent Labs  05/31/16 1659 05/31/16 2115 06/01/16 0756  GLUCAP 187* 145* 111*    Resume home  meds.   Chronic Afib - Rate and rhythm controlled  Continue Xarelto, BB and Cardizem    Discharge Instructions  Discharge Instructions    Diet - low sodium heart healthy    Complete by:  As directed    Increase activity slowly    Complete by:  As directed      Allergies as of 06/01/2016   No Known Allergies     Medication List    TAKE these medications   azithromycin 500 MG tablet Commonly known as:  ZITHROMAX Take 1 tablet (500 mg total) by mouth daily.   buPROPion 150 MG 12 hr tablet Commonly known as:  WELLBUTRIN SR Take 150 mg by mouth 2 (two) times daily.   diltiazem 360 MG 24 hr capsule Commonly known as:  CARDIZEM CD Take 1 capsule (360 mg total) by mouth daily.   esomeprazole 40 MG capsule Commonly known as:  NEXIUM Take 40 mg by mouth daily.   ferrous gluconate 324 MG tablet Commonly known as:  FERGON Take 1 tablet (324 mg total) by mouth 2 (two) times daily with a meal.   furosemide 20 MG tablet Commonly known as:  LASIX TAKE 2 TABLETS DAILY   glimepiride 2 MG tablet Commonly known as:  AMARYL Take 2 mg by mouth daily with breakfast.   insulin aspart 100 UNIT/ML injection Commonly known as:  novoLOG Inject 0-5 Units into the skin at bedtime.   metFORMIN 500 MG 24 hr tablet Commonly known as:  GLUCOPHAGE-XR Take 1,000 mg by mouth 2 (two) times daily.   metoprolol 50 MG tablet Commonly known as:  LOPRESSOR Take 2 tablets (100 mg total) by mouth 2 (two) times daily.   nitroGLYCERIN 0.4 MG SL tablet Commonly known as:  NITROSTAT Place 0.4 mg under the tongue every 5 (five) minutes as needed for chest pain (x 3 doses).   Omega 3 1200 MG Caps Take 1,200 mg by mouth daily.   ORENCIA CLICKJECT 0000000 MG/ML Soaj Generic drug:  Abatacept Inject 125 mg into the skin every Thursday.   potassium chloride 10 MEQ tablet Commonly known as:  KLOR-CON 10 Take 2 tablets (20 mEq total) by mouth 2 (two) times daily.   predniSONE 5 MG tablet Commonly  known as:  DELTASONE Take 5 mg by mouth daily.   PRESERVISION AREDS 2 Caps Take 1 capsule by mouth 2 (two) times daily.   rivaroxaban 20 MG Tabs tablet Commonly known as:  XARELTO Take 20 mg by mouth daily with supper.   rosuvastatin 10 MG tablet Commonly known as:  CRESTOR Take 10 mg by mouth daily.   VITAMIN B 12 PO Take 1,000 mcg by mouth daily.   Vitamin D3 1000 units Caps Take 1,000 Units by mouth daily.      Follow-up Information    KAPLAN,KRISTEN, PA-C. Schedule an appointment as soon as possible for a visit in 1 week(s).   Specialty:  Family Medicine Contact information: [address] [place] [address] [phone]          No Known Allergies  Consultations:  None.    Procedures/Studies: Dg Chest 2 View  Result Date: 06/01/2016 CLINICAL DATA:  Chest pain. EXAM: CHEST  2 VIEW COMPARISON:  Radiographs of May 30, 2016. FINDINGS: Stable cardiomediastinal silhouette and central pulmonary vascular congestion. No pneumothorax or pleural effusion is noted. Bony thorax is unremarkable. No consolidative process is noted. IMPRESSION: Stable mild central pulmonary vascular congestion. Electronically Signed   By: Marijo Conception, M.D.   On: 06/01/2016 11:45   Dg Chest 2 View  Result Date: 05/30/2016 CLINICAL DATA:  Cough, congestion, nausea, and vomiting for 1 day. EXAM: CHEST  2 VIEW COMPARISON:  05/22/2016 FINDINGS: Right PICC has been removed. The cardiac silhouette is borderline to mildly enlarged. Mild pulmonary vascular congestion has decreased. There is mild asymmetric accentuation of the interstitial markings in the left lower lobe which has improved. No pleural effusion or pneumothorax is identified. No acute osseous abnormality is seen. IMPRESSION: 1. Decreased pulmonary vascular congestion. 2. Mild asymmetric interstitial prominence in the left lung base may be related to mild residual vascular congestion, although atypical infection is not  excluded. Electronically Signed   By: Logan Bores M.D.   On: 05/30/2016 12:52   Dg Chest 2 View  Result Date: 05/16/2016 CLINICAL DATA:  Shortness of breath for 2 weeks, chronic dry cough, history atrial fibrillation, hypertension, diabetes mellitus, pneumonia EXAM: CHEST  2 VIEW COMPARISON:  03/19/2016 FINDINGS: Enlargement of cardiac silhouette with pulmonary vascular congestion. Mediastinal contours normal. Slight chronic accentuation of perihilar markings, stable. No definite acute infiltrate, pneumothorax or acute osseous findings. Blunting of posterior costophrenic angles by tiny effusions. IMPRESSION: Enlargement of cardiac silhouette with pulmonary vascular congestion. Tiny pleural effusions without pneumothorax. Electronically Signed   By: Lavonia Dana M.D.   On: 05/16/2016 18:15   Dg Chest Port 1 View  Result Date: 05/22/2016 CLINICAL DATA:  Pulmonary edema. EXAM: PORTABLE CHEST 1 VIEW COMPARISON:  Yesterday FINDINGS: Right upper extremity PICC with tip at the upper right atrium. Cardiomegaly. Increased generalized interstitial opacity without Kerley lines. No effusion or  pneumothorax. No air bronchogram. IMPRESSION: Increased vascular congestion. Electronically Signed   By: Monte Fantasia M.D.   On: 05/22/2016 07:15   Dg Chest Port 1 View  Result Date: 05/21/2016 CLINICAL DATA:  Line placement EXAM: PORTABLE CHEST 1 VIEW COMPARISON:  Earlier the same day FINDINGS: 1816 hours. Right PICC line is new in the interval with the tip overlying the distal SVC level. The cardio pericardial silhouette is enlarged. There is pulmonary vascular congestion without overt pulmonary edema. Interstitial markings are diffusely coarsened with chronic features. Bone windows reveal no worrisome lytic or sclerotic osseous lesions. Telemetry leads overlie the chest. IMPRESSION: Right PICC line tip overlies the distal SVC level. Electronically Signed   By: Misty Stanley M.D.   On: 05/21/2016 18:35   Dg Chest Port 1  View  Result Date: 05/21/2016 CLINICAL DATA:  Shortness of breath and cough EXAM: PORTABLE CHEST 1 VIEW COMPARISON:  May 20, 2016 and May 16, 2016 FINDINGS: There remains mild cardiac enlargement with mild pulmonary venous hypertension. No frank edema or consolidation. Interstitium is prominent ; there may be slight interstitial edema. No evident adenopathy. No bone lesions. IMPRESSION: Pulmonary vascular congestion with questionable mild interstitial edema. Essentially stable study compared to 1 day prior. No consolidation. Electronically Signed   By: Lowella Grip III M.D.   On: 05/21/2016 10:38   Dg Chest Portable 1 View  Result Date: 05/20/2016 CLINICAL DATA:  Acute onset of shortness of breath. Initial encounter. EXAM: PORTABLE CHEST 1 VIEW COMPARISON:  Chest radiograph performed 05/16/2016 FINDINGS: The lungs are well-aerated. Vascular congestion is noted. Bibasilar airspace opacities may reflect mild interstitial edema. No pleural effusion or pneumothorax is seen. The cardiomediastinal silhouette is mildly enlarged. No acute osseous abnormalities are seen. IMPRESSION: Vascular congestion and mild cardiomegaly. Bibasilar airspace opacities may reflect mild interstitial edema. Electronically Signed   By: Garald Balding M.D.   On: 05/20/2016 19:59       Subjective: Feeling better.   Discharge Exam: Vitals:   06/01/16 0409 06/01/16 0932  BP: (!) 119/58 (!) 124/52  Pulse: 67 (!) 58  Resp: 16 14  Temp: 98.7 F (37.1 C) 98.1 F (36.7 C)   Vitals:   05/31/16 1714 05/31/16 2116 06/01/16 0409 06/01/16 0932  BP: (!) 115/56 111/62 (!) 119/58 (!) 124/52  Pulse: 61 65 67 (!) 58  Resp: 14 15 16 14   Temp: 97.4 F (36.3 C) 98.1 F (36.7 C) 98.7 F (37.1 C) 98.1 F (36.7 C)  TempSrc: Oral Oral Oral Oral  SpO2: 100% 97% 99% 98%  Weight:  78 kg (171 lb 14.4 oz)      General: Pt is alert, awake, not in acute distress Cardiovascular: RRR, S1/S2 +, no rubs, no  gallops Respiratory: CTA bilaterally, no wheezing, no rhonchi Abdominal: Soft, NT, ND, bowel sounds + Extremities: no edema, no cyanosis    The results of significant diagnostics from this hospitalization (including imaging, microbiology, ancillary and laboratory) are listed below for reference.     Microbiology: No results found for this or any previous visit (from the past 240 hour(s)).   Labs: BNP (last 3 results)  Recent Labs  03/19/16 1740 05/11/16 0938 05/20/16 1937  BNP 227.9* 250.3* [id number]   Basic Metabolic Panel:  Recent Labs Lab 05/26/16 0418 05/30/16 1153 05/31/16 0400 06/01/16 0436  NA 139 138 140 140  K 4.4 4.0 4.5 3.7  CL 98* 104 109 111  CO2 29 24 25 23   GLUCOSE 204* 206* 82 140*  BUN  24* 14 8 5*  CREATININE 0.69 0.63 0.60 0.70  CALCIUM 9.0 8.9 8.7* 8.5*   Liver Function Tests:  Recent Labs Lab 05/30/16 1153 05/31/16 0400  AST 17 16  ALT 16 13*  ALKPHOS 95 84  BILITOT 1.1 0.9  PROT 6.1* 5.7*  ALBUMIN 2.8* 2.3*    Recent Labs Lab 05/30/16 1153  LIPASE 19   No results for input(s): AMMONIA in the last 168 hours. CBC:  Recent Labs Lab 05/26/16 0418 05/30/16 1153 05/31/16 0400 06/01/16 0436  WBC 10.0 12.0* 10.7* 9.2  HGB 8.5* 10.4* 9.7* 8.9*  HCT 30.9* 37.5 34.3* 32.0*  MCV 78.0 78.1 78.3 78.2  PLT 194 292 296 322   Cardiac Enzymes:  Recent Labs Lab 05/30/16 1153 05/30/16 1708 05/30/16 2125 05/31/16 0400  TROPONINI <0.03 <0.03 <0.03 <0.03   BNP: Invalid input(s): POCBNP CBG:  Recent Labs Lab 05/31/16 0758 05/31/16 1221 05/31/16 1659 05/31/16 2115 06/01/16 0756  GLUCAP 153* 162* 187* 145* 111*   D-Dimer No results for input(s): DDIMER in the last 72 hours. Hgb A1c No results for input(s): HGBA1C in the last 72 hours. Lipid Profile No results for input(s): CHOL, HDL, LDLCALC, TRIG, CHOLHDL, LDLDIRECT in the last 72 hours. Thyroid function studies No results for input(s): TSH, T4TOTAL, T3FREE, THYROIDAB in  the last 72 hours.  Invalid input(s): FREET3 Anemia work up No results for input(s): VITAMINB12, FOLATE, FERRITIN, TIBC, IRON, RETICCTPCT in the last 72 hours. Urinalysis    Component Value Date/Time   COLORURINE YELLOW 05/31/2016 [place] 05/31/2016 0445   LABSPEC 1.013 05/31/2016 0445   PHURINE 7.0 05/31/2016 0445   GLUCOSEU NEGATIVE 05/31/2016 0445   HGBUR NEGATIVE 05/31/2016 0445   BILIRUBINUR NEGATIVE 05/31/2016 0445   KETONESUR NEGATIVE 05/31/2016 0445   PROTEINUR NEGATIVE 05/31/2016 0445   UROBILINOGEN 1.0 10/09/2014 0040   NITRITE NEGATIVE 05/31/2016 0445   LEUKOCYTESUR NEGATIVE 05/31/2016 0445   Sepsis Labs Invalid input(s): PROCALCITONIN,  WBC,  LACTICIDVEN Microbiology No results found for this or any previous visit (from the past 240 hour(s)).   Time coordinating discharge: Over 30 minutes  SIGNED:   Hosie Poisson, MD  [facility] 06/01/2016, 12:19 PM Pager   If 7PM-7AM, please contact night-coverage [email or web address] Password [email or web address]

## 2016-06-06 ENCOUNTER — Ambulatory Visit: Payer: Medicare Other | Admitting: Cardiology

## 2016-06-06 DIAGNOSIS — J189 Pneumonia, unspecified organism: Secondary | ICD-10-CM | POA: Diagnosis not present

## 2016-06-06 DIAGNOSIS — D500 Iron deficiency anemia secondary to blood loss (chronic): Secondary | ICD-10-CM | POA: Diagnosis not present

## 2016-06-06 DIAGNOSIS — Z09 Encounter for follow-up examination after completed treatment for conditions other than malignant neoplasm: Secondary | ICD-10-CM | POA: Diagnosis not present

## 2016-06-14 ENCOUNTER — Encounter (HOSPITAL_COMMUNITY): Payer: Self-pay | Admitting: *Deleted

## 2016-06-14 NOTE — Progress Notes (Deleted)
HPI: FU Atrial fibrillation. Nuclear study December 2016 showed normal perfusion. Not gated because of atrial fibrillation. Patient had digoxin discontinued and metoprolol decreased previously because of bradycardia. Echo 1/18 showed normal LV function; mld MR; mild biatrial enlargement. Holter 1/18 showed afib with mildly elevated rate; cardizem increased. Seen for dyspnea 12/17; Hgb 7.7; found to have bleeding prox small bowel AVM. Recently had cardizem increased further. Seen again 2/17 with dyspnea and Hgb again decreased. Then admitted with flu/pneumonia. Since she was last seen   Current Outpatient Prescriptions  Medication Sig Dispense Refill  . Abatacept (ORENCIA CLICKJECT) 465 MG/ML SOAJ Inject 125 mg into the skin every Thursday.     [name] buPROPion (WELLBUTRIN SR) 150 MG 12 hr tablet Take 150 mg by mouth 2 (two) times daily.      . Cholecalciferol (VITAMIN D3) 1000 units CAPS Take 1,000 Units by mouth daily.    . Cyanocobalamin (VITAMIN B 12 PO) Take 1,000 mcg by mouth daily.     [name] diltiazem (CARDIZEM CD) 360 MG 24 hr capsule Take 1 capsule (360 mg total) by mouth daily. 90 capsule 3  . esomeprazole (NEXIUM) 40 MG capsule Take 40 mg by mouth daily.      . ferrous gluconate (FERGON) 324 MG tablet Take 1 tablet (324 mg total) by mouth 2 (two) times daily with a meal. 60 tablet 3  . furosemide (LASIX) 20 MG tablet TAKE 2 TABLETS DAILY 60 tablet 6  . glimepiride (AMARYL) 2 MG tablet Take 2 mg by mouth daily with breakfast.   3  . insulin aspart (NOVOLOG) 100 UNIT/ML injection Inject 0-5 Units into the skin at bedtime. 10 mL 11  . metFORMIN (GLUCOPHAGE-XR) 500 MG 24 hr tablet Take 1,000 mg by mouth 2 (two) times daily.    . metoprolol (LOPRESSOR) 50 MG tablet Take 2 tablets (100 mg total) by mouth 2 (two) times daily. 60 tablet 6  . Multiple Vitamins-Minerals (PRESERVISION AREDS 2) CAPS Take 1 capsule by mouth 2 (two) times daily.    . nitroGLYCERIN (NITROSTAT) 0.4 MG SL tablet Place 0.4  mg under the tongue every 5 (five) minutes as needed for chest pain (x 3 doses).     . Omega 3 1200 MG CAPS Take 1,200 mg by mouth daily.     . potassium chloride (KLOR-CON 10) 10 MEQ tablet Take 2 tablets (20 mEq total) by mouth 2 (two) times daily. 120 tablet 10  . predniSONE (DELTASONE) 5 MG tablet Take 5 mg by mouth daily.    . rivaroxaban (XARELTO) 20 MG TABS tablet Take 20 mg by mouth daily with supper.    . rosuvastatin (CRESTOR) 10 MG tablet Take 10 mg by mouth daily.      No current facility-administered medications for this visit.      Past Medical History:  Diagnosis Date  . Actinic keratosis   . Anemia    hx of  . Anemia of chronic renal failure, stage 3 (moderate) 07/17/2015  . Anticoagulant causing adverse effect in therapeutic use 07/17/2015  . Atrial fibrillation ([place])   . Atrial fibrillation ([place])   . Carpal tunnel syndrome   . Edema    left leg at ankle resolved now  . Gastroesophageal reflux disease   . Hepatitis 1970   not sure what kind  . Hiatal hernia   . Hyperlipidemia   . Hypertension   . Iron deficiency anemia due to chronic blood loss 07/17/2015  . Jaundice  age 71  . Peripheral vascular disease ([place]) yrs ago   DVT left lower leg questionale told by 2 drs i had no clot, 1 md said i did  . Rheumatoid arthritis(714.0)   . Type II diabetes mellitus ([place])    type2    Past Surgical History:  Procedure Laterality Date  . CATARACT EXTRACTION Bilateral   . COLONOSCOPY WITH PROPOFOL N/A 02/20/2015   Procedure: COLONOSCOPY WITH PROPOFOL;  Surgeon: Carol Ada, MD;  Location: [facility];  Service: Endoscopy;  Laterality: N/A;  . ENTEROSCOPY N/A 03/23/2016   Procedure: ENTEROSCOPY;  Surgeon: Carol Ada, MD;  Location: [facility];  Service: Endoscopy;  Laterality: N/A;  . ESOPHAGOGASTRODUODENOSCOPY (EGD) WITH PROPOFOL N/A 02/20/2015   Procedure: ESOPHAGOGASTRODUODENOSCOPY (EGD) WITH PROPOFOL;  Surgeon: Carol Ada, MD;  Location: [facility];   Service: Endoscopy;  Laterality: N/A;  . GIVENS CAPSULE STUDY N/A 03/21/2016   Procedure: GIVENS CAPSULE STUDY;  Surgeon: Carol Ada, MD;  Location: [facility];  Service: Endoscopy;  Laterality: N/A;  . HAND SURGERY  1995 and 1996   artificial joints both hands  . JOINT REPLACEMENT    . LUMBAR LAMINECTOMY/DECOMPRESSION MICRODISCECTOMY N/A 02/10/2016   Procedure: MICROLUMBAR DECOMPRESSION L4-L5 AND L3- L4, AND EXCISION OF SYNOVIAL CYST L4-L5;  Surgeon: Susa Day, MD;  Location: [facility];  Service: Orthopedics;  Laterality: N/A;  . TOTAL HIP ARTHROPLASTY Left 2009  . TOTAL HIP ARTHROPLASTY Right 04/03/2013   Procedure: RIGHT TOTAL HIP ARTHROPLASTY ANTERIOR APPROACH;  Surgeon: Gearlean Alf, MD;  Location: [facility];  Service: Orthopedics;  Laterality: Right;    Social History   Social History  . Marital status: Divorced    Spouse name: N/A  . Number of children: N/A  . Years of education: N/A   Occupational History  . Retired    Social History Main Topics  . Smoking status: Never Smoker  . Smokeless tobacco: Never Used  . Alcohol use No  . Drug use: No  . Sexual activity: Not Currently   Other Topics Concern  . Not on file   Social History Narrative  . No narrative on file    Family History  Problem Relation Age of Onset  . Stroke Father   . Heart failure Sister   . Hypertension Sister   . Heart attack Neg Hx     ROS: no fevers or chills, productive cough, hemoptysis, dysphasia, odynophagia, melena, hematochezia, dysuria, hematuria, rash, seizure activity, orthopnea, PND, pedal edema, claudication. Remaining systems are negative.  Physical Exam: Well-developed well-nourished in no acute distress.  Skin is warm and dry.  HEENT is normal.  Neck is supple.  Chest is clear to auscultation with normal expansion.  Cardiovascular exam is regular rate and rhythm.  Abdominal exam nontender or distended. No masses palpated. Extremities show no edema. neuro grossly  intact  ECG- personally reviewed  A/P  1  Kirk Ruths, MD

## 2016-06-17 ENCOUNTER — Ambulatory Visit (HOSPITAL_COMMUNITY): Payer: Medicare Other | Admitting: Anesthesiology

## 2016-06-17 ENCOUNTER — Ambulatory Visit (HOSPITAL_COMMUNITY)
Admission: RE | Admit: 2016-06-17 | Discharge: 2016-06-17 | Disposition: A | Discharge status: Home / Self Care | Payer: Medicare Other | Source: Ambulatory Visit | Attending: Gastroenterology | Admitting: Gastroenterology

## 2016-06-17 ENCOUNTER — Encounter (HOSPITAL_COMMUNITY): Payer: Self-pay

## 2016-06-17 ENCOUNTER — Encounter (HOSPITAL_COMMUNITY)
Admission: RE | Disposition: A | Discharge status: Home / Self Care | Payer: Self-pay | Source: Ambulatory Visit | Attending: Gastroenterology

## 2016-06-17 DIAGNOSIS — D631 Anemia in chronic kidney disease: Secondary | ICD-10-CM | POA: Insufficient documentation

## 2016-06-17 DIAGNOSIS — E7800 Pure hypercholesterolemia, unspecified: Secondary | ICD-10-CM | POA: Diagnosis not present

## 2016-06-17 DIAGNOSIS — N183 Chronic kidney disease, stage 3 (moderate): Secondary | ICD-10-CM | POA: Insufficient documentation

## 2016-06-17 DIAGNOSIS — D509 Iron deficiency anemia, unspecified: Secondary | ICD-10-CM | POA: Diagnosis not present

## 2016-06-17 DIAGNOSIS — F419 Anxiety disorder, unspecified: Secondary | ICD-10-CM | POA: Insufficient documentation

## 2016-06-17 DIAGNOSIS — I4891 Unspecified atrial fibrillation: Secondary | ICD-10-CM | POA: Insufficient documentation

## 2016-06-17 DIAGNOSIS — R112 Nausea with vomiting, unspecified: Secondary | ICD-10-CM | POA: Diagnosis not present

## 2016-06-17 DIAGNOSIS — E1122 Type 2 diabetes mellitus with diabetic chronic kidney disease: Secondary | ICD-10-CM | POA: Insufficient documentation

## 2016-06-17 DIAGNOSIS — G5600 Carpal tunnel syndrome, unspecified upper limb: Secondary | ICD-10-CM | POA: Diagnosis not present

## 2016-06-17 DIAGNOSIS — I129 Hypertensive chronic kidney disease with stage 1 through stage 4 chronic kidney disease, or unspecified chronic kidney disease: Secondary | ICD-10-CM | POA: Insufficient documentation

## 2016-06-17 DIAGNOSIS — F329 Major depressive disorder, single episode, unspecified: Secondary | ICD-10-CM | POA: Diagnosis not present

## 2016-06-17 DIAGNOSIS — Z79899 Other long term (current) drug therapy: Secondary | ICD-10-CM | POA: Insufficient documentation

## 2016-06-17 DIAGNOSIS — E1151 Type 2 diabetes mellitus with diabetic peripheral angiopathy without gangrene: Secondary | ICD-10-CM | POA: Insufficient documentation

## 2016-06-17 DIAGNOSIS — Z7984 Long term (current) use of oral hypoglycemic drugs: Secondary | ICD-10-CM | POA: Diagnosis not present

## 2016-06-17 DIAGNOSIS — E785 Hyperlipidemia, unspecified: Secondary | ICD-10-CM | POA: Diagnosis not present

## 2016-06-17 DIAGNOSIS — K219 Gastro-esophageal reflux disease without esophagitis: Secondary | ICD-10-CM | POA: Insufficient documentation

## 2016-06-17 DIAGNOSIS — M069 Rheumatoid arthritis, unspecified: Secondary | ICD-10-CM | POA: Diagnosis not present

## 2016-06-17 DIAGNOSIS — Q2739 Arteriovenous malformation, other site: Secondary | ICD-10-CM | POA: Diagnosis not present

## 2016-06-17 DIAGNOSIS — D500 Iron deficiency anemia secondary to blood loss (chronic): Secondary | ICD-10-CM | POA: Diagnosis not present

## 2016-06-17 DIAGNOSIS — R195 Other fecal abnormalities: Secondary | ICD-10-CM | POA: Diagnosis not present

## 2016-06-17 HISTORY — PX: ENTEROSCOPY: SHX5533

## 2016-06-17 LAB — GLUCOSE, CAPILLARY: Glucose-Capillary: 137 mg/dL — ABNORMAL HIGH (ref 65–99)

## 2016-06-17 SURGERY — ENTEROSCOPY
Anesthesia: Monitor Anesthesia Care

## 2016-06-17 MED ORDER — PROPOFOL 10 MG/ML IV BOLUS
INTRAVENOUS | Status: DC | PRN
  Administered 2016-06-17 (×4): 20 mg via INTRAVENOUS
  Administered 2016-06-17: 10 mg via INTRAVENOUS
  Administered 2016-06-17 (×2): 20 mg via INTRAVENOUS
  Administered 2016-06-17: 30 mg via INTRAVENOUS
  Administered 2016-06-17 (×3): 20 mg via INTRAVENOUS
  Administered 2016-06-17 (×2): 30 mg via INTRAVENOUS
  Administered 2016-06-17 (×2): 20 mg via INTRAVENOUS
  Administered 2016-06-17: 10 mg via INTRAVENOUS
  Administered 2016-06-17: 30 mg via INTRAVENOUS

## 2016-06-17 MED ORDER — ONDANSETRON HCL 4 MG/2ML IJ SOLN
INTRAMUSCULAR | Status: AC
  Filled 2016-06-17: qty 2

## 2016-06-17 MED ORDER — LACTATED RINGERS IV SOLN
INTRAVENOUS | Status: DC
  Administered 2016-06-17: 1000 mL via INTRAVENOUS

## 2016-06-17 MED ORDER — PROPOFOL 10 MG/ML IV BOLUS
INTRAVENOUS | Status: AC
  Filled 2016-06-17: qty 20

## 2016-06-17 MED ORDER — ONDANSETRON HCL 4 MG/2ML IJ SOLN
INTRAMUSCULAR | Status: DC | PRN
  Administered 2016-06-17: 4 mg via INTRAVENOUS

## 2016-06-17 MED ORDER — SODIUM CHLORIDE 0.9 % IV SOLN: INTRAVENOUS | Status: DC

## 2016-06-17 NOTE — Transfer of Care (Signed)
Immediate Anesthesia Transfer of Care Note  Patient: [name]  Procedure(s) Performed: Procedure(s): ENTEROSCOPY (N/A)  Patient Location: PACU  Anesthesia Type:MAC  Level of Consciousness: sedated  Airway & Oxygen Therapy: Patient Spontanous Breathing and Patient connected to nasal cannula oxygen  Post-op Assessment: Report given to RN and Post -op Vital signs reviewed and stable  Post vital signs: Reviewed and stable  Last Vitals:  Vitals:   06/17/16 0956  BP: (!) 149/71  Pulse: 100  Resp: (!) 22  Temp: 36.8 C    Last Pain:  Vitals:   06/17/16 0956  TempSrc: Oral         Complications: No apparent anesthesia complications

## 2016-06-17 NOTE — Anesthesia Preprocedure Evaluation (Signed)
Anesthesia Evaluation  Patient identified by MRN, date of birth, ID band Patient awake    Reviewed: Allergy & Precautions, NPO status , Patient's Chart, lab work & pertinent test results, reviewed documented beta blocker date and time   Airway Mallampati: II  TM Distance: >3 FB Neck ROM: Full    Dental  (+) Dental Advisory Given   Pulmonary neg pulmonary ROS,    breath sounds clear to auscultation       Cardiovascular hypertension, Pt. on medications and Pt. on home beta blockers + Peripheral Vascular Disease and + DVT (Off xarelto since 11/4)   Rhythm:Regular Rate:Normal     Neuro/Psych Anxiety Depression  Neuromuscular disease    GI/Hepatic Neg liver ROS, hiatal hernia, GERD  ,  Endo/Other  diabetes, Type 2, Oral Hypoglycemic Agents  Renal/GU negative Renal ROS     Musculoskeletal  (+) Arthritis ,   Abdominal   Peds  Hematology  (+) anemia ,   Anesthesia Other Findings   Reproductive/Obstetrics                             Lab Results  Component Value Date   WBC 9.2 06/01/2016   HGB 8.9 (L) 06/01/2016   HCT 32.0 (L) 06/01/2016   MCV 78.2 06/01/2016   PLT 322 06/01/2016   Lab Results  Component Value Date   CREATININE 0.70 06/01/2016   BUN 5 (L) 06/01/2016   NA 140 06/01/2016   K 3.7 06/01/2016   CL 111 06/01/2016   CO2 23 06/01/2016   Lab Results  Component Value Date   INR 1.12 02/10/2016   INR 1.98 02/03/2016   INR 1.37 02/21/2015    Anesthesia Physical  Anesthesia Plan  ASA: III  Anesthesia Plan: MAC   Post-op Pain Management:    Induction: Intravenous  Airway Management Planned: Natural Airway and Nasal Cannula  Additional Equipment:   Intra-op Plan:   Post-operative Plan:   Informed Consent: I have reviewed the patients History and Physical, chart, labs and discussed the procedure including the risks, benefits and alternatives for the proposed  anesthesia with the patient or authorized representative who has indicated his/her understanding and acceptance.   Dental advisory given  Plan Discussed with: CRNA  Anesthesia Plan Comments:         Anesthesia Quick Evaluation

## 2016-06-17 NOTE — Discharge Instructions (Signed)

## 2016-06-17 NOTE — Op Note (Signed)
[facility] Patient Name: [name] Procedure Date: 06/17/2016 MRN: [id number] Attending MD: Carol Ada , MD Date of Birth: 01/29/1946 CSN: [id number] Age: 71 Admit Type: Outpatient Procedure:                Small bowel enteroscopy Indications:              Arteriovenous malformation in the small intestine Providers:                Carol Ada, MD, Kingsley Plan, RN, Alfonso Patten,                            Technician, Derrek Gu. Alday CRNA, CRNA Referring MD:              Medicines:                Propofol per Anesthesia Complications:            No immediate complications. Estimated Blood Loss:     Estimated blood loss: none. Procedure:                Pre-Anesthesia Assessment:                           - Prior to the procedure, a History and Physical                            was performed, and patient medications and                            allergies were reviewed. The patient's tolerance of                            previous anesthesia was also reviewed. The risks                            and benefits of the procedure and the sedation                            options and risks were discussed with the patient.                            All questions were answered, and informed consent                            was obtained. Prior Anticoagulants: The patient has                            taken Xarelto (rivaroxaban), last dose was 1 day                            prior to procedure. ASA Grade Assessment: III - A                            patient with severe systemic disease. After  reviewing the risks and benefits, the patient was                            deemed in satisfactory condition to undergo the                            procedure.                           - Sedation was administered by an anesthesia                            professional. Deep sedation was attained.                           After obtaining  informed consent, the endoscope was                            passed under direct vision. Throughout the                            procedure, the patient's blood pressure, pulse, and                            oxygen saturations were monitored continuously. The                            EC-[id number] ([id number]) scope was introduced through                            the mouth and advanced to the proximal jejunum. The                            small bowel enteroscopy was accomplished without                            difficulty. The patient tolerated the procedure                            well. Scope In: Scope Out: Findings:      The esophagus was normal.      The stomach was normal.      The examined duodenum was normal.      There was no evidence of significant pathology in the entire examined       portion of jejunum. Two detailed examinations of the small bowel were       performed. Impression:               - Normal esophagus.                           - Normal stomach.                           - Normal examined duodenum.                           -  The examined portion of the jejunum was normal.                           - No specimens collected. Recommendation:           - Patient has a contact number available for                            emergencies. The signs and symptoms of potential                            delayed complications were discussed with the                            patient. Return to normal activities tomorrow.                            Written discharge instructions were provided to the                            patient.                           - Resume previous diet.                           - Restart Xarelto.                           - Repeat enteroscopy when there is evidence of                            active bleeding. Procedure Code(s):        --- Professional ---                           [id number], Small intestinal endoscopy, enteroscopy                             beyond second portion of duodenum, not including                            ileum; diagnostic, including collection of                            specimen(s) by brushing or washing, when performed                            (separate procedure) Diagnosis Code(s):        --- Professional ---                           Q27.33, Arteriovenous malformation of digestive                            system vessel CPT copyright 2016 [employer]. All rights reserved. The codes documented in this  report are preliminary and upon coder review may  be revised to meet current compliance requirements. Carol Ada, MD Carol Ada, MD 06/17/2016 10:49:57 AM This report has been signed electronically. Number of Addenda: 0

## 2016-06-17 NOTE — H&P (Signed)
[name] HPI: This is a 71 year old female with a PMH of GI bleed.  She suffered with a GI bleed on Xarelto.  Her HGB dropped by 2-3 grams.  In December 2017 she was evaluated with a capsule endoscopy for anemia and it was positive for a proximal small bowel bleeding AVM.  The follow up enteroscopy two days later was negative for any bleeding  Past Medical History:  Diagnosis Date  . Actinic keratosis   . Anemia    hx of  . Anemia of chronic renal failure, stage 3 (moderate) 07/17/2015  . Anticoagulant causing adverse effect in therapeutic use 07/17/2015  . Atrial fibrillation ([place])   . Atrial fibrillation ([place])   . Carpal tunnel syndrome   . Edema    left leg at ankle resolved now  . Gastroesophageal reflux disease   . Hepatitis 1970   not sure what kind  . Hiatal hernia   . Hyperlipidemia   . Hypertension   . Iron deficiency anemia due to chronic blood loss 07/17/2015  . Jaundice    age 40  . Peripheral vascular disease ([place]) yrs ago   DVT left lower leg questionale told by 2 drs i had no clot, 1 md said i did  . Rheumatoid arthritis(714.0)   . Type II diabetes mellitus ([place])    type2    Past Surgical History:  Procedure Laterality Date  . CATARACT EXTRACTION Bilateral   . COLONOSCOPY WITH PROPOFOL N/A 02/20/2015   Procedure: COLONOSCOPY WITH PROPOFOL;  Surgeon: Carol Ada, MD;  Location: [facility];  Service: Endoscopy;  Laterality: N/A;  . ENTEROSCOPY N/A 03/23/2016   Procedure: ENTEROSCOPY;  Surgeon: Carol Ada, MD;  Location: [facility];  Service: Endoscopy;  Laterality: N/A;  . ESOPHAGOGASTRODUODENOSCOPY (EGD) WITH PROPOFOL N/A 02/20/2015   Procedure: ESOPHAGOGASTRODUODENOSCOPY (EGD) WITH PROPOFOL;  Surgeon: Carol Ada, MD;  Location: [facility];  Service: Endoscopy;  Laterality: N/A;  . GIVENS CAPSULE STUDY N/A 03/21/2016   Procedure: GIVENS CAPSULE STUDY;  Surgeon: Carol Ada, MD;  Location: [facility];  Service: Endoscopy;  Laterality: N/A;  .  HAND SURGERY  1995 and 1996   artificial joints both hands  . JOINT REPLACEMENT    . LUMBAR LAMINECTOMY/DECOMPRESSION MICRODISCECTOMY N/A 02/10/2016   Procedure: MICROLUMBAR DECOMPRESSION L4-L5 AND L3- L4, AND EXCISION OF SYNOVIAL CYST L4-L5;  Surgeon: Susa Day, MD;  Location: [facility];  Service: Orthopedics;  Laterality: N/A;  . TOTAL HIP ARTHROPLASTY Left 2009  . TOTAL HIP ARTHROPLASTY Right 04/03/2013   Procedure: RIGHT TOTAL HIP ARTHROPLASTY ANTERIOR APPROACH;  Surgeon: Gearlean Alf, MD;  Location: [facility];  Service: Orthopedics;  Laterality: Right;    Family History  Problem Relation Age of Onset  . Stroke Father   . Heart failure Sister   . Hypertension Sister   . Heart attack Neg Hx     Social History:  reports that she has never smoked. She has never used smokeless tobacco. She reports that she does not drink alcohol or use drugs.  Allergies: No Known Allergies  Medications:  Scheduled:  Continuous: . sodium chloride    . lactated ringers 1,000 mL (06/17/16 1015)    Results for orders placed or performed during the hospital encounter of 06/17/16 (from the past 24 hour(s))  Glucose, capillary     Status: Abnormal   Collection Time: 06/17/16 10:12 AM  Result Value Ref Range   Glucose-Capillary 137 (H) 65 - 99 mg/dL     No results found.  ROS:  As stated above in the HPI otherwise negative.  Blood pressure (!) 149/71, pulse 100, temperature 98.2 F (36.8 C), temperature source Oral, resp. rate (!) 22, height 5\' 7"  (1.702 m), weight 77.6 kg (171 lb), SpO2 96 %.    PE: Gen: NAD, Alert and Oriented HEENT:  [place]/AT, EOMI Neck: Supple, no LAD Lungs: CTA Bilaterally CV: RRR without M/G/R ABM: Soft, NTND, +BS Ext: No C/C/E  Assessment/Plan: 1) History of a bleeding small bowel AVM. 2) Anemia.  Plan: 1) Enteroscopy.  Clark Clowdus D 06/17/2016, 10:21 AM

## 2016-06-17 NOTE — Anesthesia Postprocedure Evaluation (Signed)
Anesthesia Post Note  Patient: [name]  Procedure(s) Performed: Procedure(s) (LRB): ENTEROSCOPY (N/A)  Patient location during evaluation: PACU Anesthesia Type: MAC Level of consciousness: awake and alert Pain management: pain level controlled Vital Signs Assessment: post-procedure vital signs reviewed and stable Respiratory status: spontaneous breathing, nonlabored ventilation, respiratory function stable and patient connected to nasal cannula oxygen Cardiovascular status: stable and blood pressure returned to baseline Anesthetic complications: no       Last Vitals:  Vitals:   06/17/16 1110 06/17/16 1115  BP: 122/72   Pulse: 85 94  Resp: (!) 22 (!) 25  Temp:      Last Pain:  Vitals:   06/17/16 1047  TempSrc: Oral                 Tiajuana Amass

## 2016-06-20 ENCOUNTER — Encounter (HOSPITAL_COMMUNITY): Payer: Self-pay | Admitting: Gastroenterology

## 2016-06-27 ENCOUNTER — Ambulatory Visit: Payer: Medicare Other | Admitting: Cardiology

## 2016-07-11 DIAGNOSIS — R938 Abnormal findings on diagnostic imaging of other specified body structures: Secondary | ICD-10-CM | POA: Diagnosis not present

## 2016-07-18 DIAGNOSIS — M0589 Other rheumatoid arthritis with rheumatoid factor of multiple sites: Secondary | ICD-10-CM | POA: Diagnosis not present

## 2016-07-18 DIAGNOSIS — Z6828 Body mass index (BMI) 28.0-28.9, adult: Secondary | ICD-10-CM | POA: Diagnosis not present

## 2016-07-18 DIAGNOSIS — E663 Overweight: Secondary | ICD-10-CM | POA: Diagnosis not present

## 2016-07-18 DIAGNOSIS — M2550 Pain in unspecified joint: Secondary | ICD-10-CM | POA: Diagnosis not present

## 2016-07-18 DIAGNOSIS — R600 Localized edema: Secondary | ICD-10-CM | POA: Diagnosis not present

## 2016-07-18 DIAGNOSIS — Z7952 Long term (current) use of systemic steroids: Secondary | ICD-10-CM | POA: Diagnosis not present

## 2016-07-18 DIAGNOSIS — M5030 Other cervical disc degeneration, unspecified cervical region: Secondary | ICD-10-CM | POA: Diagnosis not present

## 2016-07-18 DIAGNOSIS — D500 Iron deficiency anemia secondary to blood loss (chronic): Secondary | ICD-10-CM | POA: Diagnosis not present

## 2016-07-18 DIAGNOSIS — M150 Primary generalized (osteo)arthritis: Secondary | ICD-10-CM | POA: Diagnosis not present

## 2016-07-18 DIAGNOSIS — Z79899 Other long term (current) drug therapy: Secondary | ICD-10-CM | POA: Diagnosis not present

## 2016-07-27 DIAGNOSIS — E113292 Type 2 diabetes mellitus with mild nonproliferative diabetic retinopathy without macular edema, left eye: Secondary | ICD-10-CM | POA: Diagnosis not present

## 2016-07-27 DIAGNOSIS — E119 Type 2 diabetes mellitus without complications: Secondary | ICD-10-CM | POA: Diagnosis not present

## 2016-07-27 DIAGNOSIS — H40013 Open angle with borderline findings, low risk, bilateral: Secondary | ICD-10-CM | POA: Diagnosis not present

## 2016-07-27 DIAGNOSIS — E113291 Type 2 diabetes mellitus with mild nonproliferative diabetic retinopathy without macular edema, right eye: Secondary | ICD-10-CM | POA: Diagnosis not present

## 2016-08-30 DIAGNOSIS — D500 Iron deficiency anemia secondary to blood loss (chronic): Secondary | ICD-10-CM | POA: Diagnosis not present

## 2016-09-08 NOTE — Progress Notes (Signed)
Cardiology Office Note    Date:  09/09/2016   ID:  [name], DOB December 27, 1945, MRN [id number]  PCP:  Aletha Halim., PA-C  Cardiologist: Dr. Stanford [name]  Chief Complaint  Patient presents with  . Follow-up    45-month appointment; swelling along left leg    History of Present Illness:    [name] is a 71 y.o. female with past medical history of permanent atrial fibrillation (on Xarelto), low-risk NST in 03/2015, history of GIB (occuring in 2017 secondary to AVM's), chronic diastolic CHF, and HTN who presents to the office today for 51-month follow-up.   She was examined by Christina Ransom, PA-C on 05/11/2016 with her HR elevated at 118 during her visit. She was continued on Lopressor 100mg  BID with Cardizem CD being increased to 360mg  daily.   She was admitted later that month for sepsis secondary to Influenza A and Cardiology was consulted due to atrial fibrillation with RVR.  She was discharged home on her PTA medication regimen. She represented to the ED four days after hospital discharge for nausea and vomiting. CXR was concerning for an atypical infection, therefore she was treated for CAP.   In talking with the patient today, she reports overall doing well since her hospitalizations earlier this year. She denies any recent chest discomfort or dyspnea on exertion. Does note occasional palpitations when performing household chores but says this is drastically improved as compared to previously. She has been checking her heart rate at home and notes that it is usually in the 80's.  She denies any recent orthopnea or PND. She sleeps with 3 pillows at baseline. She reports having chronic edema along her left leg which has worsened over the past month. She does report indiscretion in her diet over the past week has been consuming more sweets and cakes.    Past Medical History:  Diagnosis Date  . Actinic keratosis   . Anemia    hx of  . Anemia of chronic renal failure, stage 3  (moderate) 07/17/2015  . Anticoagulant causing adverse effect in therapeutic use 07/17/2015  . Atrial fibrillation ([place])    a. persistent, she remains on Xarelto  . Carpal tunnel syndrome   . Chronic diastolic (congestive) heart failure ([place])    a. 04/2016: echo showing a preserved EF of 55-60% with mild MR. LA and RA midly dilated.   . Edema    left leg at ankle resolved now  . Gastroesophageal reflux disease   . Hepatitis 1970   not sure what kind  . Hiatal hernia   . Hyperlipidemia   . Hypertension   . Iron deficiency anemia due to chronic blood loss 07/17/2015  . Jaundice    age 25  . Peripheral vascular disease ([place]) yrs ago   DVT left lower leg questionale told by 2 drs i had no clot, 1 md said i did  . Rheumatoid arthritis(714.0)   . Type II diabetes mellitus ([place])    type2    Past Surgical History:  Procedure Laterality Date  . CATARACT EXTRACTION Bilateral   . COLONOSCOPY WITH PROPOFOL N/A 02/20/2015   Procedure: COLONOSCOPY WITH PROPOFOL;  Surgeon: Christina Ada, MD;  [facility];  Service: Endoscopy;  Laterality: N/A;  . ENTEROSCOPY N/A 03/23/2016   Procedure: ENTEROSCOPY;  Surgeon: Christina Ada, MD;  [facility];  Service: Endoscopy;  Laterality: N/A;  . ENTEROSCOPY N/A 06/17/2016   Procedure: ENTEROSCOPY;  Surgeon: Christina Ada, MD;  [facility];  Service: Endoscopy;  Laterality: N/A;  . ESOPHAGOGASTRODUODENOSCOPY (EGD) WITH PROPOFOL N/A 02/20/2015   Procedure: ESOPHAGOGASTRODUODENOSCOPY (EGD) WITH PROPOFOL;  Surgeon: Christina Ada, MD;  [facility];  Service: Endoscopy;  Laterality: N/A;  . GIVENS CAPSULE STUDY N/A 03/21/2016   Procedure: GIVENS CAPSULE STUDY;  Surgeon: Christina Ada, MD;  [facility];  Service: Endoscopy;  Laterality: N/A;  . HAND SURGERY  1995 and 1996   artificial joints both hands  . JOINT REPLACEMENT    . LUMBAR LAMINECTOMY/DECOMPRESSION MICRODISCECTOMY N/A 02/10/2016   Procedure: MICROLUMBAR  DECOMPRESSION L4-L5 AND L3- L4, AND EXCISION OF SYNOVIAL CYST L4-L5;  Surgeon: Christina Day, MD;  [facility];  Service: Orthopedics;  Laterality: N/A;  . TOTAL HIP ARTHROPLASTY Left 2009  . TOTAL HIP ARTHROPLASTY Right 04/03/2013   Procedure: RIGHT TOTAL HIP ARTHROPLASTY ANTERIOR APPROACH;  Surgeon: Christina Alf, MD;  [facility];  Service: Orthopedics;  Laterality: Right;    Current Medications: Outpatient Medications Prior to Visit  Medication Sig Dispense Refill  . Abatacept (ORENCIA CLICKJECT) 938 MG/ML SOAJ Inject 125 mg into the skin every Thursday.     [name] buPROPion (WELLBUTRIN SR) 150 MG 12 hr tablet Take 150 mg by mouth 2 (two) times daily.      . Cholecalciferol (VITAMIN D3) 1000 units CAPS Take 1,000 Units by mouth daily.    . Cyanocobalamin (VITAMIN B 12 PO) Take 1,000 mcg by mouth daily.     [name] diltiazem (CARDIZEM CD) 360 MG 24 hr capsule Take 1 capsule (360 mg total) by mouth daily. 90 capsule 3  . esomeprazole (NEXIUM) 40 MG capsule Take 40 mg by mouth every morning.     . ferrous gluconate (FERGON) 324 MG tablet Take 1 tablet (324 mg total) by mouth 2 (two) times daily with a meal. 60 tablet 3  . metFORMIN (GLUCOPHAGE-XR) 500 MG 24 hr tablet Take 1,000 mg by mouth 2 (two) times daily.    . metoprolol (LOPRESSOR) 50 MG tablet Take 2 tablets (100 mg total) by mouth 2 (two) times daily. 60 tablet 6  . Multiple Vitamins-Minerals (PRESERVISION AREDS 2) CAPS Take 1 capsule by mouth 2 (two) times daily.    . nitroGLYCERIN (NITROSTAT) 0.4 MG SL tablet Place 0.4 mg under the tongue every 5 (five) minutes as needed for chest pain (x 3 doses).     . Omega 3 1200 MG CAPS Take 1,200 mg by mouth daily.     . potassium chloride (KLOR-CON 10) 10 MEQ tablet Take 2 tablets (20 mEq total) by mouth 2 (two) times daily. 120 tablet 10  . predniSONE (DELTASONE) 5 MG tablet Take 5 mg by mouth daily.    . rivaroxaban (XARELTO) 20 MG TABS tablet Take 20 mg by mouth daily with supper.    .  rosuvastatin (CRESTOR) 10 MG tablet Take 10 mg by mouth every evening.     . furosemide (LASIX) 20 MG tablet TAKE 2 TABLETS DAILY 60 tablet 6  . glimepiride (AMARYL) 2 MG tablet Take 2 mg by mouth daily with breakfast.   3   No facility-administered medications prior to visit.      Allergies:   Patient has no known allergies.   Social History   Social History  . Marital status: Divorced    Spouse name: N/A  . Number of children: N/A  . Years of education: N/A   Occupational History  . Retired    Social History Main Topics  . Smoking status: Never Smoker  .  Smokeless tobacco: Never Used  . Alcohol use No  . Drug use: No  . Sexual activity: Not Currently   Other Topics Concern  . None   Social History Narrative  . None     Family History:  The patient's family history includes Heart failure in her sister; Hypertension in her sister; Stroke in her father.   Review of Systems:   Please see the history of present illness.     General:  No chills, fever, night sweats or weight changes. Positive for difficulty sleeping.  Cardiovascular:  No chest pain, dyspnea on exertion, orthopnea, palpitations, paroxysmal nocturnal dyspnea. Positive for edema.  Dermatological: No rash, lesions/masses Respiratory: No cough, dyspnea Urologic: No hematuria, dysuria Abdominal:   No nausea, vomiting, diarrhea, bright red blood per rectum, melena, or hematemesis Neurologic:  No visual changes, wkns, changes in mental status. All other systems reviewed and are otherwise negative except as noted above.   Physical Exam:    VS:  BP 124/76   Pulse 97   Ht 5\' 7"  (1.702 m)   Wt 183 lb (83 kg)   BMI 28.66 kg/m    General: Well developed, well nourished elderly Caucasian female appearing in no acute distress. Head: Normocephalic, atraumatic, sclera non-icteric, no xanthomas, nares are without discharge.  Neck: No carotid bruits. JVD not elevated.  Lungs: Respirations regular and unlabored,  without wheezes or rales.  Heart:Irregularly irregular. No S3 or S4.  No murmur, no rubs, or gallops appreciated. Abdomen: Soft, non-tender, non-distended with normoactive bowel sounds. No hepatomegaly. No rebound/guarding. No obvious abdominal masses. Msk:  Strength and tone appear normal for age. No joint deformities or effusions. Extremities: No clubbing or cyanosis. 1+ pitting edema along LLE, trace edema on right.  Distal pedal pulses are 2+ bilaterally. Neuro: Alert and oriented X 3. Moves all extremities spontaneously. No focal deficits noted. Psych:  Responds to questions appropriately with a normal affect. Skin: No rashes or lesions noted  Wt Readings from Last 3 Encounters:  09/09/16 183 lb (83 kg)  06/17/16 171 lb (77.6 kg)  05/31/16 171 lb 14.4 oz (78 kg)      Studies/Labs Reviewed:   EKG:  EKG is not ordered today.    Recent Labs: 05/20/2016: B Natriuretic Peptide 349.7; TSH 0.817 05/22/2016: Magnesium 1.9 05/31/2016: ALT 13 06/01/2016: BUN 5; Creatinine, Ser 0.70; Hemoglobin 8.9; Platelets 322; Potassium 3.7; Sodium 140   Lipid Panel    Component Value Date/Time   CHOL 156 02/22/2015 0155   TRIG 130 02/22/2015 0155   HDL 18 (L) 02/22/2015 0155   CHOLHDL 8.7 02/22/2015 0155   VLDL 26 02/22/2015 0155   LDLCALC 112 (H) 02/22/2015 0155   LDLDIRECT 117.3 09/06/2012 0954    Additional studies/ records that were reviewed today include:   Echocardiogram: 04/2016 Study Conclusions  - Left ventricle: The cavity size was normal. There was mild   concentric hypertrophy. Systolic function was normal. The   estimated ejection fraction was in the range of 55% to 60%. Wall   motion was normal; there were no regional wall motion   abnormalities. The study is not technically sufficient to allow   evaluation of LV diastolic function. - Mitral valve: Calcified annulus. Mildly thickened leaflets .   There was mild regurgitation. - Left atrium: The atrium was mildly  dilated. - Right atrium: The atrium was mildly dilated.  Assessment:    1. Permanent atrial fibrillation ([place])   2. Chronic anticoagulation   3. Chronic diastolic heart failure (  [place])   4. Essential hypertension   5. Difficulty sleeping      Plan:   In order of problems listed above:  1. Permanent Atrial Fibrillation - she has a known history of this. Reports minimal palpitations and says HR is usually well-controled in the 80's.  - continue Cardizem CD 360mg  daily and Lopressor 100mg  BID for rate-control. - continue Xarelto for anticoagulation.   2. Chronic Anticoagulation - This patients CHA2DS2-VASc Score and unadjusted Ischemic Stroke Rate (% per year) is equal to 9.7 % stroke rate/year from a score of 6 (HTN, DM, Female, Age, History of DVT (2)). She does have a history of GIB in the setting of AVM's. She denies any recent melena or hematochezia. Continue Xarelto for anticoagulation.   3. Chronic Diastolic CHF - echo in 85/0277 showed a preserved EF of 55-60% with mildly dilated LA and RA. - she does have pitting edema along her left lower extremity which has been present for years per the patient's report but has acutely worsened over the past month. Weight has significantly increased since her visit 3 months ago but she reports dietary indiscretion and has been consuming pies and cakes regularly.  - sodium and fluid restriction reviewed with the patient.  - she is currently on Lasix 40mg  daily. Will increase to 60mg  daily for the next 4 days, then return to 40mg  daily. She may take an additional 20mg  as needed for edema.   4. HTN - BP is well-controlled at 124/76 during today's visit. - continue current medication regimen.   5. Difficulty Sleeping - reports waking up at 3-4:00 AM with difficulty going back to sleep.  -healthy sleep habits were reviewed. Recommended she could try OTC Melatonin. If no improvement, she should follow-up with her PCP for further evaluation.    Medication Adjustments/Labs and Tests Ordered: Current medicines are reviewed at length with the patient today.  Concerns regarding medicines are outlined above.  Medication changes, Labs and Tests ordered today are listed in the Patient Instructions below. Patient Instructions  Medication Instructions: Please take Furosemide 60 mg (3 of the 20 mg tablets) for 4 days, then resume the 40 mg  (2 tablets) daily. You may take an additional 20 mg as needed for swelling.   You may take an over the counter Melatonin as needed for sleep.  Follow-Up: Your physician wants you to follow-up in: 6 months with Dr. Stanford [name].  You will receive a reminder letter in the mail two months in advance. If you don't receive a letter, please call our office to schedule the follow-up appointment.   If you need a refill on your cardiac medications before your next appointment, please call your pharmacy.    Signed, Erma Heritage, PA-C  09/09/2016 8:46 AM    [facility] [place], [place], [place]  [address] Phone: [phone]; Fax: [phone]  [address] [place], [place] [address] Phone: [phone]

## 2016-09-09 ENCOUNTER — Encounter: Payer: Self-pay | Admitting: Student

## 2016-09-09 ENCOUNTER — Other Ambulatory Visit: Payer: Self-pay | Admitting: Cardiology

## 2016-09-09 ENCOUNTER — Ambulatory Visit (INDEPENDENT_AMBULATORY_CARE_PROVIDER_SITE_OTHER): Payer: Medicare Other | Admitting: Student

## 2016-09-09 VITALS — BP 124/76 | HR 97 | Ht 67.0 in | Wt 183.0 lb

## 2016-09-09 DIAGNOSIS — Z7901 Long term (current) use of anticoagulants: Secondary | ICD-10-CM | POA: Diagnosis not present

## 2016-09-09 DIAGNOSIS — I10 Essential (primary) hypertension: Secondary | ICD-10-CM

## 2016-09-09 DIAGNOSIS — G479 Sleep disorder, unspecified: Secondary | ICD-10-CM

## 2016-09-09 DIAGNOSIS — I482 Chronic atrial fibrillation: Secondary | ICD-10-CM

## 2016-09-09 DIAGNOSIS — I4821 Permanent atrial fibrillation: Secondary | ICD-10-CM

## 2016-09-09 DIAGNOSIS — I5032 Chronic diastolic (congestive) heart failure: Secondary | ICD-10-CM

## 2016-09-09 MED ORDER — FUROSEMIDE 20 MG PO TABS: ORAL_TABLET | ORAL | 6 refills | Status: DC

## 2016-09-09 NOTE — Telephone Encounter (Signed)
REFILL 

## 2016-09-09 NOTE — Patient Instructions (Signed)
Medication Instructions: Please take Furosemide 60 mg (3 of the 20 mg tablets) for 4 days, then resume the 40 mg  (2 tablets) daily. You may take an additional 20 mg as needed for swelling.   You may take an over the counter Melatonin as needed for sleep.  Follow-Up: Your physician wants you to follow-up in: 6 months with Dr. Stanford Breed.  You will receive a reminder letter in the mail two months in advance. If you don't receive a letter, please call our office to schedule the follow-up appointment.   If you need a refill on your cardiac medications before your next appointment, please call your pharmacy.

## 2016-10-17 DIAGNOSIS — Z6828 Body mass index (BMI) 28.0-28.9, adult: Secondary | ICD-10-CM | POA: Diagnosis not present

## 2016-10-17 DIAGNOSIS — R600 Localized edema: Secondary | ICD-10-CM | POA: Diagnosis not present

## 2016-10-17 DIAGNOSIS — Z79899 Other long term (current) drug therapy: Secondary | ICD-10-CM | POA: Diagnosis not present

## 2016-10-17 DIAGNOSIS — M0589 Other rheumatoid arthritis with rheumatoid factor of multiple sites: Secondary | ICD-10-CM | POA: Diagnosis not present

## 2016-10-17 DIAGNOSIS — M2550 Pain in unspecified joint: Secondary | ICD-10-CM | POA: Diagnosis not present

## 2016-10-17 DIAGNOSIS — M150 Primary generalized (osteo)arthritis: Secondary | ICD-10-CM | POA: Diagnosis not present

## 2016-10-17 DIAGNOSIS — M5030 Other cervical disc degeneration, unspecified cervical region: Secondary | ICD-10-CM | POA: Diagnosis not present

## 2016-10-17 DIAGNOSIS — Z7952 Long term (current) use of systemic steroids: Secondary | ICD-10-CM | POA: Diagnosis not present

## 2016-10-17 DIAGNOSIS — E663 Overweight: Secondary | ICD-10-CM | POA: Diagnosis not present

## 2016-10-17 DIAGNOSIS — D500 Iron deficiency anemia secondary to blood loss (chronic): Secondary | ICD-10-CM | POA: Diagnosis not present

## 2016-10-17 DIAGNOSIS — M25561 Pain in right knee: Secondary | ICD-10-CM | POA: Diagnosis not present

## 2016-11-28 ENCOUNTER — Other Ambulatory Visit: Payer: Self-pay | Admitting: Cardiology

## 2016-11-28 MED ORDER — NITROGLYCERIN 0.4 MG SL SUBL: 0.4000 mg | SUBLINGUAL_TABLET | SUBLINGUAL | 2 refills | Status: AC | PRN

## 2016-11-28 NOTE — Telephone Encounter (Signed)
Rx(s) sent to pharmacy electronically.  

## 2016-11-28 NOTE — Telephone Encounter (Signed)
New message    Verdis Frederickson from [facility] is calling for pt.    *STAT* If patient is at the pharmacy, call can be transferred to refill team.   1. Which medications need to be refilled? (please list name of each medication and dose if known) nitroglycerin   2. Which pharmacy/location (including street and city if local pharmacy) is medication to be sent to? [facility] in [place]   3. Do they need a 30 day or 90 day supply? 30 day

## 2016-12-22 ENCOUNTER — Other Ambulatory Visit: Payer: Self-pay | Admitting: Cardiology

## 2016-12-23 ENCOUNTER — Other Ambulatory Visit: Payer: Self-pay | Admitting: Cardiology

## 2017-01-16 DIAGNOSIS — E663 Overweight: Secondary | ICD-10-CM | POA: Diagnosis not present

## 2017-01-16 DIAGNOSIS — Z7952 Long term (current) use of systemic steroids: Secondary | ICD-10-CM | POA: Diagnosis not present

## 2017-01-16 DIAGNOSIS — M5030 Other cervical disc degeneration, unspecified cervical region: Secondary | ICD-10-CM | POA: Diagnosis not present

## 2017-01-16 DIAGNOSIS — M25561 Pain in right knee: Secondary | ICD-10-CM | POA: Diagnosis not present

## 2017-01-16 DIAGNOSIS — M0589 Other rheumatoid arthritis with rheumatoid factor of multiple sites: Secondary | ICD-10-CM | POA: Diagnosis not present

## 2017-01-16 DIAGNOSIS — D500 Iron deficiency anemia secondary to blood loss (chronic): Secondary | ICD-10-CM | POA: Diagnosis not present

## 2017-01-16 DIAGNOSIS — R600 Localized edema: Secondary | ICD-10-CM | POA: Diagnosis not present

## 2017-01-16 DIAGNOSIS — M150 Primary generalized (osteo)arthritis: Secondary | ICD-10-CM | POA: Diagnosis not present

## 2017-01-16 DIAGNOSIS — Z6827 Body mass index (BMI) 27.0-27.9, adult: Secondary | ICD-10-CM | POA: Diagnosis not present

## 2017-01-16 DIAGNOSIS — Z79899 Other long term (current) drug therapy: Secondary | ICD-10-CM | POA: Diagnosis not present

## 2017-01-16 DIAGNOSIS — Z23 Encounter for immunization: Secondary | ICD-10-CM | POA: Diagnosis not present

## 2017-01-16 DIAGNOSIS — M2550 Pain in unspecified joint: Secondary | ICD-10-CM | POA: Diagnosis not present

## 2017-02-15 ENCOUNTER — Other Ambulatory Visit: Payer: Self-pay

## 2017-02-15 MED ORDER — POTASSIUM CHLORIDE ER 10 MEQ PO TBCR: 20.0000 meq | EXTENDED_RELEASE_TABLET | Freq: Two times a day (BID) | ORAL | 3 refills | Status: DC

## 2017-02-17 ENCOUNTER — Other Ambulatory Visit: Payer: Self-pay

## 2017-02-17 MED ORDER — POTASSIUM CHLORIDE ER 10 MEQ PO TBCR: 20.0000 meq | EXTENDED_RELEASE_TABLET | Freq: Two times a day (BID) | ORAL | 3 refills | Status: DC

## 2017-03-10 ENCOUNTER — Other Ambulatory Visit: Payer: Self-pay | Admitting: Nurse Practitioner

## 2017-03-25 ENCOUNTER — Other Ambulatory Visit: Payer: Self-pay | Admitting: Student

## 2017-03-27 NOTE — Telephone Encounter (Signed)
This is Dr. Crenshaw's pt 

## 2017-03-29 DIAGNOSIS — K449 Diaphragmatic hernia without obstruction or gangrene: Secondary | ICD-10-CM | POA: Insufficient documentation

## 2017-03-29 DIAGNOSIS — I739 Peripheral vascular disease, unspecified: Secondary | ICD-10-CM | POA: Insufficient documentation

## 2017-03-29 DIAGNOSIS — I4891 Unspecified atrial fibrillation: Secondary | ICD-10-CM | POA: Insufficient documentation

## 2017-03-29 DIAGNOSIS — L570 Actinic keratosis: Secondary | ICD-10-CM | POA: Insufficient documentation

## 2017-03-29 DIAGNOSIS — E785 Hyperlipidemia, unspecified: Secondary | ICD-10-CM | POA: Insufficient documentation

## 2017-03-29 DIAGNOSIS — I5032 Chronic diastolic (congestive) heart failure: Secondary | ICD-10-CM | POA: Insufficient documentation

## 2017-03-29 DIAGNOSIS — D649 Anemia, unspecified: Secondary | ICD-10-CM | POA: Insufficient documentation

## 2017-03-29 DIAGNOSIS — R609 Edema, unspecified: Secondary | ICD-10-CM | POA: Insufficient documentation

## 2017-03-29 DIAGNOSIS — G5600 Carpal tunnel syndrome, unspecified upper limb: Secondary | ICD-10-CM | POA: Insufficient documentation

## 2017-03-29 DIAGNOSIS — E119 Type 2 diabetes mellitus without complications: Secondary | ICD-10-CM | POA: Insufficient documentation

## 2017-03-29 DIAGNOSIS — I482 Chronic atrial fibrillation, unspecified: Secondary | ICD-10-CM | POA: Insufficient documentation

## 2017-04-12 NOTE — Progress Notes (Signed)
HPI: FU Atrial fibrillation. Nuclear study December 2016 showed normal perfusion. Not gated because of atrial fibrillation. Patient had digoxin discontinued and metoprolol decreased previously because of bradycardia.  Last echocardiogram January 2018 showed normal LV function, mild biatrial enlargement and mild mitral regurgitation.  Holter monitor January 2018 showed atrial fibrillation with mildly elevated rate.  Cardizem increased.  Has history of GI bleeding from small bowel AVM.  Since she was last seen the patient has dyspnea with more extreme activities but not with routine activities. It is relieved with rest. It is not associated with chest pain. There is no orthopnea, PND. There is no syncope or palpitations. There is no exertional chest pain. Chronic mild edema left lower extremity.   Current Outpatient Medications  Medication Sig Dispense Refill  . Abatacept (ORENCIA CLICKJECT) 196 MG/ML SOAJ Inject 125 mg into the skin every Thursday.     [name] buPROPion (WELLBUTRIN SR) 150 MG 12 hr tablet Take 150 mg by mouth 2 (two) times daily.      . Cholecalciferol (VITAMIN D3) 1000 units CAPS Take 1,000 Units by mouth daily.    . Cyanocobalamin (VITAMIN B 12 PO) Take 1,000 mcg by mouth daily.     [name] diltiazem (CARDIZEM CD) 360 MG 24 hr capsule Take 1 capsule (360 mg total) by mouth daily. 90 capsule 3  . esomeprazole (NEXIUM) 40 MG capsule Take 40 mg by mouth every morning.     . ferrous gluconate (FERGON) 324 MG tablet Take 1 tablet (324 mg total) by mouth 2 (two) times daily with a meal. 60 tablet 3  . furosemide (LASIX) 20 MG tablet Take 2 tablets (40 mg total) by mouth daily. Take 2 tablets by mouth daily. TAKE AN ADDITIONAL 1 TAB AS NEEDED 90 tablet 5  . metFORMIN (GLUCOPHAGE-XR) 500 MG 24 hr tablet Take 1,000 mg by mouth 2 (two) times daily.    . metoprolol tartrate (LOPRESSOR) 50 MG tablet TAKE 2 TABLETS (100 MG TOTAL) BY MOUTH 2 (TWO) TIMES DAILY. 120 tablet 11  . Multiple  Vitamins-Minerals (PRESERVISION AREDS 2) CAPS Take 1 capsule by mouth 2 (two) times daily.    . nitroGLYCERIN (NITROSTAT) 0.4 MG SL tablet Place 1 tablet (0.4 mg total) under the tongue every 5 (five) minutes as needed for chest pain (x 3 doses). 25 tablet 2  . Omega 3 1200 MG CAPS Take 1,200 mg by mouth daily.     . potassium chloride (KLOR-CON 10) 10 MEQ tablet Take 2 tablets (20 mEq total) 2 (two) times daily by mouth. 360 tablet 3  . predniSONE (DELTASONE) 5 MG tablet Take 5 mg by mouth daily.    . rivaroxaban (XARELTO) 20 MG TABS tablet Take 20 mg by mouth daily with supper.    . rosuvastatin (CRESTOR) 10 MG tablet Take 10 mg by mouth every evening.      No current facility-administered medications for this visit.      Past Medical History:  Diagnosis Date  . Actinic keratosis   . Anemia    hx of  . Anemia of chronic renal failure, stage 3 (moderate) ([place]) 07/17/2015  . Anticoagulant causing adverse effect in therapeutic use 07/17/2015  . Atrial fibrillation ([place])    a. persistent, she remains on Xarelto  . Carpal tunnel syndrome   . Chronic diastolic (congestive) heart failure ([place])    a. 04/2016: echo showing a preserved EF of 55-60% with mild MR. LA and RA midly dilated.   . Edema  left leg at ankle resolved now  . Gastroesophageal reflux disease   . Hepatitis 1970   not sure what kind  . Hiatal hernia   . Hyperlipidemia   . Hypertension   . Iron deficiency anemia due to chronic blood loss 07/17/2015  . Jaundice    age 31  . Peripheral vascular disease ([place]) yrs ago   DVT left lower leg questionale told by 2 drs i had no clot, 1 md said i did  . Rheumatoid arthritis(714.0)   . Type II diabetes mellitus ([place])    type2    Past Surgical History:  Procedure Laterality Date  . CATARACT EXTRACTION Bilateral   . COLONOSCOPY WITH PROPOFOL N/A 02/20/2015   Procedure: COLONOSCOPY WITH PROPOFOL;  Surgeon: Carol Ada, MD;  [facility];  Service: Endoscopy;   Laterality: N/A;  . ENTEROSCOPY N/A 03/23/2016   Procedure: ENTEROSCOPY;  Surgeon: Carol Ada, MD;  [facility];  Service: Endoscopy;  Laterality: N/A;  . ENTEROSCOPY N/A 06/17/2016   Procedure: ENTEROSCOPY;  Surgeon: Carol Ada, MD;  [facility];  Service: Endoscopy;  Laterality: N/A;  . ESOPHAGOGASTRODUODENOSCOPY (EGD) WITH PROPOFOL N/A 02/20/2015   Procedure: ESOPHAGOGASTRODUODENOSCOPY (EGD) WITH PROPOFOL;  Surgeon: Carol Ada, MD;  [facility];  Service: Endoscopy;  Laterality: N/A;  . GIVENS CAPSULE STUDY N/A 03/21/2016   Procedure: GIVENS CAPSULE STUDY;  Surgeon: Carol Ada, MD;  [facility];  Service: Endoscopy;  Laterality: N/A;  . HAND SURGERY  1995 and 1996   artificial joints both hands  . JOINT REPLACEMENT    . LUMBAR LAMINECTOMY/DECOMPRESSION MICRODISCECTOMY N/A 02/10/2016   Procedure: MICROLUMBAR DECOMPRESSION L4-L5 AND L3- L4, AND EXCISION OF SYNOVIAL CYST L4-L5;  Surgeon: Susa Day, MD;  [facility];  Service: Orthopedics;  Laterality: N/A;  . TOTAL HIP ARTHROPLASTY Left 2009  . TOTAL HIP ARTHROPLASTY Right 04/03/2013   Procedure: RIGHT TOTAL HIP ARTHROPLASTY ANTERIOR APPROACH;  Surgeon: Gearlean Alf, MD;  [facility];  Service: Orthopedics;  Laterality: Right;    Social History   Socioeconomic History  . Marital status: Divorced    Spouse name: Not on file  . Number of children: Not on file  . Years of education: Not on file  . Highest education level: Not on file  Social Needs  . Financial resource strain: Not on file  . Food insecurity - worry: Not on file  . Food insecurity - inability: Not on file  . Transportation needs - medical: Not on file  . Transportation needs - non-medical: Not on file  Occupational History  . Occupation: Retired  Tobacco Use  . Smoking status: Never Smoker  . Smokeless tobacco: Never Used  Substance and Sexual Activity  . Alcohol use: No    Alcohol/week: 0.0 oz  .  Drug use: No  . Sexual activity: Not Currently  Other Topics Concern  . Not on file  Social History Narrative  . Not on file    Family History  Problem Relation Age of Onset  . Stroke Father   . Heart failure Sister   . Hypertension Sister   . Heart attack Neg Hx     ROS:  Arthralgias but no fevers or chills, productive cough, hemoptysis, dysphasia, odynophagia, melena, hematochezia, dysuria, hematuria, rash, seizure activity, orthopnea, PND, claudication. Remaining systems are negative.  Physical Exam: Well-developed well-nourished in no acute distress.  Skin is warm and dry.  HEENT is normal.  Neck is supple.  Chest is clear to auscultation with  normal expansion.  Cardiovascular exam is irregular Abdominal exam nontender or distended. No masses palpated. Extremities show trace edema left lower extremity. Changes of rheumatoid arthritis. neuro grossly intact  ECG- atrial fibrillation at a rate of 101. No ST changes. personally reviewed  A/P  1 permanent atrial fibrillation-patient heart rate is reasonably well controlled today. Continue present dose of Cardizem and lopressor. Continue xarelto. Check hemoglobin and renal function.  2 hypertension-blood pressure is controlled. Continue present medications.  3 chronic diastolic congestive heart failure-patient appears to be euvolemic. Continue present dose of Lasix. Check potassium and renal function. 3  4 hyperlipidemia-continue statin.   Kirk Ruths, MD

## 2017-04-18 DIAGNOSIS — M25532 Pain in left wrist: Secondary | ICD-10-CM | POA: Diagnosis not present

## 2017-04-18 DIAGNOSIS — M25561 Pain in right knee: Secondary | ICD-10-CM | POA: Diagnosis not present

## 2017-04-18 DIAGNOSIS — M2550 Pain in unspecified joint: Secondary | ICD-10-CM | POA: Diagnosis not present

## 2017-04-18 DIAGNOSIS — E663 Overweight: Secondary | ICD-10-CM | POA: Diagnosis not present

## 2017-04-18 DIAGNOSIS — M0589 Other rheumatoid arthritis with rheumatoid factor of multiple sites: Secondary | ICD-10-CM | POA: Diagnosis not present

## 2017-04-18 DIAGNOSIS — Z6827 Body mass index (BMI) 27.0-27.9, adult: Secondary | ICD-10-CM | POA: Diagnosis not present

## 2017-04-18 DIAGNOSIS — D500 Iron deficiency anemia secondary to blood loss (chronic): Secondary | ICD-10-CM | POA: Diagnosis not present

## 2017-04-18 DIAGNOSIS — Z7952 Long term (current) use of systemic steroids: Secondary | ICD-10-CM | POA: Diagnosis not present

## 2017-04-18 DIAGNOSIS — Z79899 Other long term (current) drug therapy: Secondary | ICD-10-CM | POA: Diagnosis not present

## 2017-04-18 DIAGNOSIS — M5030 Other cervical disc degeneration, unspecified cervical region: Secondary | ICD-10-CM | POA: Diagnosis not present

## 2017-04-18 DIAGNOSIS — R600 Localized edema: Secondary | ICD-10-CM | POA: Diagnosis not present

## 2017-04-19 ENCOUNTER — Encounter: Payer: Self-pay | Admitting: Cardiology

## 2017-04-19 ENCOUNTER — Ambulatory Visit (INDEPENDENT_AMBULATORY_CARE_PROVIDER_SITE_OTHER): Payer: Medicare Other | Admitting: Cardiology

## 2017-04-19 VITALS — BP 108/64 | HR 101 | Ht 67.0 in | Wt 180.2 lb

## 2017-04-19 DIAGNOSIS — I5032 Chronic diastolic (congestive) heart failure: Secondary | ICD-10-CM | POA: Diagnosis not present

## 2017-04-19 DIAGNOSIS — I482 Chronic atrial fibrillation: Secondary | ICD-10-CM

## 2017-04-19 DIAGNOSIS — I10 Essential (primary) hypertension: Secondary | ICD-10-CM

## 2017-04-19 DIAGNOSIS — I4821 Permanent atrial fibrillation: Secondary | ICD-10-CM

## 2017-04-19 LAB — CBC
HEMATOCRIT: 35.2 % (ref 34.0–46.6)
Hemoglobin: 10.9 g/dL — ABNORMAL LOW (ref 11.1–15.9)
MCH: 24.2 pg — ABNORMAL LOW (ref 26.6–33.0)
MCHC: 31 g/dL — AB (ref 31.5–35.7)
MCV: 78 fL — AB (ref 79–97)
PLATELETS: 343 10*3/uL (ref 150–379)
RBC: 4.51 x10E6/uL (ref 3.77–5.28)
RDW: 18 % — AB (ref 12.3–15.4)
WBC: 11.4 10*3/uL — AB (ref 3.4–10.8)

## 2017-04-19 LAB — BASIC METABOLIC PANEL
BUN/Creatinine Ratio: 29 — ABNORMAL HIGH (ref 12–28)
BUN: 24 mg/dL (ref 8–27)
CALCIUM: 9.7 mg/dL (ref 8.7–10.3)
CHLORIDE: 101 mmol/L (ref 96–106)
CO2: 21 mmol/L (ref 20–29)
Creatinine, Ser: 0.84 mg/dL (ref 0.57–1.00)
GFR calc Af Amer: 80 mL/min/{1.73_m2} (ref >59)
GFR, EST NON AFRICAN AMERICAN: 70 mL/min/{1.73_m2} (ref >59)
Glucose: 183 mg/dL — ABNORMAL HIGH (ref 65–99)
POTASSIUM: 4.8 mmol/L (ref 3.5–5.2)
SODIUM: 140 mmol/L (ref 134–144)

## 2017-04-19 NOTE — Patient Instructions (Signed)

## 2017-04-19 NOTE — Addendum Note (Signed)
Addended by: Zebedee Iba on: 04/19/2017 11:44 AM   Modules accepted: Orders

## 2017-04-21 ENCOUNTER — Encounter: Payer: Self-pay | Admitting: *Deleted

## 2017-05-02 ENCOUNTER — Other Ambulatory Visit: Payer: Self-pay | Admitting: Cardiology

## 2017-05-02 NOTE — Telephone Encounter (Signed)
Rx(s) sent to pharmacy electronically.  

## 2017-05-15 DIAGNOSIS — E663 Overweight: Secondary | ICD-10-CM | POA: Diagnosis not present

## 2017-05-15 DIAGNOSIS — M5030 Other cervical disc degeneration, unspecified cervical region: Secondary | ICD-10-CM | POA: Diagnosis not present

## 2017-05-15 DIAGNOSIS — Z79899 Other long term (current) drug therapy: Secondary | ICD-10-CM | POA: Diagnosis not present

## 2017-05-15 DIAGNOSIS — M7050 Other bursitis of knee, unspecified knee: Secondary | ICD-10-CM | POA: Diagnosis not present

## 2017-05-15 DIAGNOSIS — Z7952 Long term (current) use of systemic steroids: Secondary | ICD-10-CM | POA: Diagnosis not present

## 2017-05-15 DIAGNOSIS — M25561 Pain in right knee: Secondary | ICD-10-CM | POA: Diagnosis not present

## 2017-05-15 DIAGNOSIS — D500 Iron deficiency anemia secondary to blood loss (chronic): Secondary | ICD-10-CM | POA: Diagnosis not present

## 2017-05-15 DIAGNOSIS — M0589 Other rheumatoid arthritis with rheumatoid factor of multiple sites: Secondary | ICD-10-CM | POA: Diagnosis not present

## 2017-05-15 DIAGNOSIS — R600 Localized edema: Secondary | ICD-10-CM | POA: Diagnosis not present

## 2017-05-15 DIAGNOSIS — Z6828 Body mass index (BMI) 28.0-28.9, adult: Secondary | ICD-10-CM | POA: Diagnosis not present

## 2017-05-15 DIAGNOSIS — M25532 Pain in left wrist: Secondary | ICD-10-CM | POA: Diagnosis not present

## 2017-05-15 DIAGNOSIS — M2550 Pain in unspecified joint: Secondary | ICD-10-CM | POA: Diagnosis not present

## 2017-05-26 ENCOUNTER — Emergency Department (HOSPITAL_COMMUNITY): Payer: Medicare Other

## 2017-05-26 ENCOUNTER — Other Ambulatory Visit: Payer: Self-pay

## 2017-05-26 ENCOUNTER — Encounter (HOSPITAL_COMMUNITY): Payer: Self-pay | Admitting: Emergency Medicine

## 2017-05-26 ENCOUNTER — Emergency Department (HOSPITAL_COMMUNITY)
Admission: EM | Admit: 2017-05-26 | Discharge: 2017-05-26 | Disposition: A | Discharge status: Home / Self Care | Payer: Medicare Other | Attending: Emergency Medicine | Admitting: Emergency Medicine

## 2017-05-26 DIAGNOSIS — Z79899 Other long term (current) drug therapy: Secondary | ICD-10-CM | POA: Diagnosis not present

## 2017-05-26 DIAGNOSIS — W1839XA Other fall on same level, initial encounter: Secondary | ICD-10-CM | POA: Diagnosis not present

## 2017-05-26 DIAGNOSIS — E1122 Type 2 diabetes mellitus with diabetic chronic kidney disease: Secondary | ICD-10-CM | POA: Insufficient documentation

## 2017-05-26 DIAGNOSIS — I130 Hypertensive heart and chronic kidney disease with heart failure and stage 1 through stage 4 chronic kidney disease, or unspecified chronic kidney disease: Secondary | ICD-10-CM | POA: Insufficient documentation

## 2017-05-26 DIAGNOSIS — I5032 Chronic diastolic (congestive) heart failure: Secondary | ICD-10-CM | POA: Insufficient documentation

## 2017-05-26 DIAGNOSIS — R200 Anesthesia of skin: Secondary | ICD-10-CM

## 2017-05-26 DIAGNOSIS — N183 Chronic kidney disease, stage 3 (moderate): Secondary | ICD-10-CM | POA: Insufficient documentation

## 2017-05-26 DIAGNOSIS — M25561 Pain in right knee: Secondary | ICD-10-CM | POA: Insufficient documentation

## 2017-05-26 DIAGNOSIS — S92415A Nondisplaced fracture of proximal phalanx of left great toe, initial encounter for closed fracture: Secondary | ICD-10-CM | POA: Diagnosis not present

## 2017-05-26 DIAGNOSIS — S3982XA Other specified injuries of lower back, initial encounter: Secondary | ICD-10-CM | POA: Diagnosis not present

## 2017-05-26 DIAGNOSIS — Y939 Activity, unspecified: Secondary | ICD-10-CM | POA: Diagnosis not present

## 2017-05-26 DIAGNOSIS — S92411A Displaced fracture of proximal phalanx of right great toe, initial encounter for closed fracture: Secondary | ICD-10-CM | POA: Diagnosis not present

## 2017-05-26 DIAGNOSIS — Y929 Unspecified place or not applicable: Secondary | ICD-10-CM | POA: Diagnosis not present

## 2017-05-26 DIAGNOSIS — Z96643 Presence of artificial hip joint, bilateral: Secondary | ICD-10-CM | POA: Diagnosis not present

## 2017-05-26 DIAGNOSIS — Z7901 Long term (current) use of anticoagulants: Secondary | ICD-10-CM | POA: Insufficient documentation

## 2017-05-26 DIAGNOSIS — Z7984 Long term (current) use of oral hypoglycemic drugs: Secondary | ICD-10-CM | POA: Insufficient documentation

## 2017-05-26 DIAGNOSIS — M79604 Pain in right leg: Secondary | ICD-10-CM | POA: Diagnosis not present

## 2017-05-26 DIAGNOSIS — M5416 Radiculopathy, lumbar region: Secondary | ICD-10-CM | POA: Insufficient documentation

## 2017-05-26 DIAGNOSIS — R296 Repeated falls: Secondary | ICD-10-CM | POA: Diagnosis not present

## 2017-05-26 DIAGNOSIS — Y999 Unspecified external cause status: Secondary | ICD-10-CM | POA: Insufficient documentation

## 2017-05-26 DIAGNOSIS — M25551 Pain in right hip: Secondary | ICD-10-CM | POA: Diagnosis not present

## 2017-05-26 DIAGNOSIS — R202 Paresthesia of skin: Secondary | ICD-10-CM | POA: Diagnosis not present

## 2017-05-26 DIAGNOSIS — S99921A Unspecified injury of right foot, initial encounter: Secondary | ICD-10-CM | POA: Diagnosis present

## 2017-05-26 LAB — CBC WITH DIFFERENTIAL/PLATELET
Basophils Absolute: 0 10*3/uL (ref 0.0–0.1)
Basophils Relative: 0 %
EOS ABS: 0.3 10*3/uL (ref 0.0–0.7)
Eosinophils Relative: 2 %
HCT: 39.7 % (ref 36.0–46.0)
HEMOGLOBIN: 12 g/dL (ref 12.0–15.0)
LYMPHS ABS: 1.4 10*3/uL (ref 0.7–4.0)
Lymphocytes Relative: 11 %
MCH: 23.5 pg — AB (ref 26.0–34.0)
MCHC: 30.2 g/dL (ref 30.0–36.0)
MCV: 77.8 fL — ABNORMAL LOW (ref 78.0–100.0)
Monocytes Absolute: 1 10*3/uL (ref 0.1–1.0)
Monocytes Relative: 8 %
NEUTROS ABS: 9.7 10*3/uL — AB (ref 1.7–7.7)
NEUTROS PCT: 79 %
Platelets: 347 10*3/uL (ref 150–400)
RBC: 5.1 MIL/uL (ref 3.87–5.11)
RDW: 17.1 % — ABNORMAL HIGH (ref 11.5–15.5)
WBC: 12.3 10*3/uL — AB (ref 4.0–10.5)

## 2017-05-26 LAB — COMPREHENSIVE METABOLIC PANEL
ALBUMIN: 4 g/dL (ref 3.5–5.0)
ALK PHOS: 129 U/L — AB (ref 38–126)
ALT: 15 U/L (ref 14–54)
AST: 27 U/L (ref 15–41)
Anion gap: 15 (ref 5–15)
BUN: 19 mg/dL (ref 6–20)
CALCIUM: 9.7 mg/dL (ref 8.9–10.3)
CO2: 24 mmol/L (ref 22–32)
CREATININE: 0.86 mg/dL (ref 0.44–1.00)
Chloride: 103 mmol/L (ref 101–111)
GFR calc non Af Amer: >60 mL/min (ref >60)
GLUCOSE: 189 mg/dL — AB (ref 65–99)
Potassium: 4.5 mmol/L (ref 3.5–5.1)
SODIUM: 142 mmol/L (ref 135–145)
Total Bilirubin: 0.8 mg/dL (ref 0.3–1.2)
Total Protein: 8 g/dL (ref 6.5–8.1)

## 2017-05-26 MED ORDER — SODIUM CHLORIDE 0.9 % IV BOLUS (SEPSIS)
500.0000 mL | Freq: Once | INTRAVENOUS | Status: AC
  Administered 2017-05-26: 500 mL via INTRAVENOUS

## 2017-05-26 MED ORDER — MORPHINE SULFATE (PF) 4 MG/ML IV SOLN
4.0000 mg | Freq: Once | INTRAVENOUS | Status: AC
  Administered 2017-05-26: 4 mg via INTRAVENOUS
  Filled 2017-05-26: qty 1

## 2017-05-26 MED ORDER — GADOBENATE DIMEGLUMINE 529 MG/ML IV SOLN
18.0000 mL | Freq: Once | INTRAVENOUS | Status: AC
  Administered 2017-05-26: 18 mL via INTRAVENOUS

## 2017-05-26 NOTE — ED Notes (Signed)
Bruising rt foot  Pt states that the brusies are from her first fall

## 2017-05-26 NOTE — ED Notes (Signed)
Patient transported to X-ray 

## 2017-05-26 NOTE — ED Notes (Signed)
Pt to mri now

## 2017-05-26 NOTE — ED Provider Notes (Addendum)
[facility] EMERGENCY DEPARTMENT Provider Note   CSN: [id number] Arrival date & time: 05/26/17  1036     History   Chief Complaint Chief Complaint  Patient presents with  . Fall    HPI [name] is a 72 y.o. female hx of afib on xarelto, CHF, HTN, bilateral hip replacements and lumbar decompression and microdisectomy, back pain, hip pain, numbness. Patient states that she has a history of rheumatoid arthritis and received a steroid injection by her rheumatologist to the right hip about a week ago.  Since then, patient noticed progressive numbness to the inner part of her right thigh.  She states that she has been falling often.  This morning, she noticed that she was walking and then her right knee gave out and she fell onto her right buttock.  She states that she is able to ambulate afterwards with her walker.  She denies any head injury or loss of consciousness.  Patient denies any trouble urinating.  Patient states that she had previous lumbar decompression and microdiscectomy by Dr. Tonita Cong several years ago. Denies fever or chills.   The history is provided by the patient.    Past Medical History:  Diagnosis Date  . Actinic keratosis   . Anemia    hx of  . Anemia of chronic renal failure, stage 3 (moderate) ([place]) 07/17/2015  . Anticoagulant causing adverse effect in therapeutic use 07/17/2015  . Atrial fibrillation ([place])    a. persistent, she remains on Xarelto  . Carpal tunnel syndrome   . Chronic diastolic (congestive) heart failure ([place])    a. 04/2016: echo showing a preserved EF of 55-60% with mild MR. LA and RA midly dilated.   . Edema    left leg at ankle resolved now  . Gastroesophageal reflux disease   . Hepatitis 1970   not sure what kind  . Hiatal hernia   . Hyperlipidemia   . Hypertension   . Iron deficiency anemia due to chronic blood loss 07/17/2015  . Jaundice    age 3  . Peripheral vascular disease ([place]) yrs ago   DVT left lower leg  questionale told by 2 drs i had no clot, 1 md said i did  . Rheumatoid arthritis(714.0)   . Type II diabetes mellitus ([place])    type2    Patient Active Problem List   Diagnosis Date Noted  . Type II diabetes mellitus ([place])   . Peripheral vascular disease ([place])   . Hyperlipidemia   . Hiatal hernia   . Edema   . Chronic diastolic (congestive) heart failure ([place])   . Carpal tunnel syndrome   . Atrial fibrillation ([place])   . Anemia   . Actinic keratosis   . Intractable nausea and vomiting 05/31/2016  . Nausea & vomiting 05/30/2016  . Abnormal chest x-ray 05/30/2016  . Viral infection   . Diabetes mellitus with complication ([place])   . Influenza A 05/21/2016  . Chronic atrial fibrillation ([place]) 05/20/2016  . Acute CHF (congestive heart failure) ([place]) 05/20/2016  . History of GI bleed 03/29/2016  . Chronic anticoagulation 03/29/2016  . Symptomatic anemia 03/19/2016  . Spinal stenosis of lumbar region 02/10/2016  . Varicose veins of left leg with edema 09/15/2015  . Preop cardiovascular exam 08/25/2015  . Iron deficiency anemia due to chronic blood loss 07/17/2015  . Anemia of chronic renal failure, stage 3 (moderate) ([place]) 07/17/2015  . Anticoagulant causing adverse effect in therapeutic use 07/17/2015  . Symptomatic bradycardia  02/21/2015  . Bradycardia   . Acute deep vein thrombosis (DVT) of left lower extremity ([place]) 05/06/2014  . Abdominal wall mass of right lower quadrant 07/08/2013  . OA (osteoarthritis) of hip 04/03/2013  . Osteoarthritis of hip 01/18/2013  . Night sweats 09/06/2012  . Chest pain   . Anxiety and depression   . Gastroesophageal reflux disease   . Hepatitis   . Jaundice   . Hypertension 07/01/2010  . Hypercholesterolemia 07/01/2010  . DM2 (diabetes mellitus, type 2) ([place]) 07/01/2010  . Rheumatoid arthritis ([place]) 07/01/2010  . Sinus tachycardia 07/01/2010  . Chest pain, atypical 07/01/2010    Past Surgical History:  Procedure Laterality Date  .  CATARACT EXTRACTION Bilateral   . COLONOSCOPY WITH PROPOFOL N/A 02/20/2015   Procedure: COLONOSCOPY WITH PROPOFOL;  Surgeon: Carol Ada, MD;  Location: [facility];  Service: Endoscopy;  Laterality: N/A;  . ENTEROSCOPY N/A 03/23/2016   Procedure: ENTEROSCOPY;  Surgeon: Carol Ada, MD;  Location: [facility];  Service: Endoscopy;  Laterality: N/A;  . ENTEROSCOPY N/A 06/17/2016   Procedure: ENTEROSCOPY;  Surgeon: Carol Ada, MD;  Location: [facility];  Service: Endoscopy;  Laterality: N/A;  . ESOPHAGOGASTRODUODENOSCOPY (EGD) WITH PROPOFOL N/A 02/20/2015   Procedure: ESOPHAGOGASTRODUODENOSCOPY (EGD) WITH PROPOFOL;  Surgeon: Carol Ada, MD;  Location: [facility];  Service: Endoscopy;  Laterality: N/A;  . GIVENS CAPSULE STUDY N/A 03/21/2016   Procedure: GIVENS CAPSULE STUDY;  Surgeon: Carol Ada, MD;  Location: [facility];  Service: Endoscopy;  Laterality: N/A;  . HAND SURGERY  1995 and 1996   artificial joints both hands  . JOINT REPLACEMENT    . LUMBAR LAMINECTOMY/DECOMPRESSION MICRODISCECTOMY N/A 02/10/2016   Procedure: MICROLUMBAR DECOMPRESSION L4-L5 AND L3- L4, AND EXCISION OF SYNOVIAL CYST L4-L5;  Surgeon: Susa Day, MD;  Location: [facility];  Service: Orthopedics;  Laterality: N/A;  . TOTAL HIP ARTHROPLASTY Left 2009  . TOTAL HIP ARTHROPLASTY Right 04/03/2013   Procedure: RIGHT TOTAL HIP ARTHROPLASTY ANTERIOR APPROACH;  Surgeon: Gearlean Alf, MD;  Location: [facility];  Service: Orthopedics;  Laterality: Right;    OB History    No data available       Home Medications    Prior to Admission medications   Medication Sig Start Date End Date Taking? Authorizing Provider  diltiazem (CARDIZEM CD) 360 MG 24 hr capsule TAKE 1 CAPSULE (360 MG TOTAL) BY MOUTH DAILY. 05/02/17  Yes Lelon Perla, MD  ferrous gluconate (FERGON) 324 MG tablet Take 1 tablet (324 mg total) by mouth 2 (two) times daily with a meal. 05/17/16  Yes Rizwan, Eunice Blase, MD  furosemide (LASIX) 20 MG tablet  Take 2 tablets (40 mg total) by mouth daily. Take 2 tablets by mouth daily. TAKE AN ADDITIONAL 1 TAB AS NEEDED 03/27/17  Yes Crenshaw, Denice Bors, MD  metoprolol tartrate (LOPRESSOR) 50 MG tablet TAKE 2 TABLETS (100 MG TOTAL) BY MOUTH 2 (TWO) TIMES DAILY. 09/09/16  Yes Kilroy, Luke K, PA-C  Abatacept (ORENCIA CLICKJECT) 193 MG/ML SOAJ Inject 125 mg into the skin every Thursday.     [provider]  buPROPion (WELLBUTRIN SR) 150 MG 12 hr tablet Take 150 mg by mouth 2 (two) times daily.      [provider]  Cholecalciferol (VITAMIN D3) 1000 units CAPS Take 1,000 Units by mouth daily.    [provider]  Cyanocobalamin (VITAMIN B 12 PO) Take 1,000 mcg by mouth daily.     [provider]  esomeprazole (NEXIUM) 40 MG capsule Take 40 mg by  mouth every morning.     [provider]  metFORMIN (GLUCOPHAGE-XR) 500 MG 24 hr tablet Take 1,000 mg by mouth 2 (two) times daily.    [provider]  Multiple Vitamins-Minerals (PRESERVISION AREDS 2) CAPS Take 1 capsule by mouth 2 (two) times daily.    [provider]  nitroGLYCERIN (NITROSTAT) 0.4 MG SL tablet Place 1 tablet (0.4 mg total) under the tongue every 5 (five) minutes as needed for chest pain (x 3 doses). 11/28/16   Lelon Perla, MD  Omega 3 1200 MG CAPS Take 1,200 mg by mouth daily.     [provider]  potassium chloride (KLOR-CON 10) 10 MEQ tablet Take 2 tablets (20 mEq total) 2 (two) times daily by mouth. 02/17/17   Crenshaw, Denice Bors, MD  predniSONE (DELTASONE) 5 MG tablet Take 5 mg by mouth daily.    [provider]  rivaroxaban (XARELTO) 20 MG TABS tablet Take 20 mg by mouth daily with supper.    [provider]  rosuvastatin (CRESTOR) 10 MG tablet Take 10 mg by mouth every evening.     [provider]    Family History Family History  Problem Relation Age of Onset  . Stroke Father   . Heart failure Sister   . Hypertension Sister   . Heart attack  Neg Hx     Social History Social History   Tobacco Use  . Smoking status: Never Smoker  . Smokeless tobacco: Never Used  Substance Use Topics  . Alcohol use: No    Alcohol/week: 0.0 oz  . Drug use: No     Allergies   Patient has no known allergies.   Review of Systems Review of Systems  Musculoskeletal: Positive for back pain.  Neurological: Positive for numbness.  All other systems reviewed and are negative.    Physical Exam Updated Vital Signs BP 118/86   Pulse 94   Temp 98.8 F (37.1 C)   Resp 18   SpO2 99%   Physical Exam  Constitutional: She is oriented to person, place, and time. She appears well-developed and well-nourished.  HENT:  Head: Normocephalic.  Mouth/Throat: Oropharynx is clear and moist.  Eyes: Conjunctivae and EOM are normal. Pupils are equal, round, and reactive to light.  Neck: Normal range of motion. Neck supple.  Cardiovascular: Normal rate, regular rhythm and normal heart sounds.  Pulmonary/Chest: Effort normal and breath sounds normal. No stridor. No respiratory distress. She has no wheezes.  Abdominal: Soft. Bowel sounds are normal. She exhibits no distension. There is no tenderness. There is no guarding.  Musculoskeletal: Normal range of motion.  Nl ROM bilateral hips. No spinal tenderness, nl ROM R knee. Bruising R foot with no obvious bony tenderness. Able to bear weight on the R leg   Neurological: She is alert and oriented to person, place, and time.  Slightly dec sensation inner part of R thigh down to inner part of the R calf area. Nl hip flexion and extension, nl knee flexion and extension, 2+ knee reflexes.   Skin: Skin is warm.  R hip injection site with no evidence of infection, no fluctuance   Psychiatric: She has a normal mood and affect.  Nursing note and vitals reviewed.    ED Treatments / Results  Labs (all labs ordered are listed, but only abnormal results are displayed) Labs Reviewed  CBC WITH  DIFFERENTIAL/PLATELET - Abnormal; Notable for the following components:      Result Value   WBC 12.3 (*)  MCV 77.8 (*)    MCH 23.5 (*)    RDW 17.1 (*)    Neutro Abs 9.7 (*)    All other components within normal limits  COMPREHENSIVE METABOLIC PANEL - Abnormal; Notable for the following components:   Glucose, Bld 189 (*)    Alkaline Phosphatase 129 (*)    All other components within normal limits    EKG  EKG Interpretation None       Radiology Dg Lumbar Spine Complete  Result Date: 05/26/2017 CLINICAL DATA:  Falling with right leg pain. EXAM: LUMBAR SPINE - COMPLETE 4+ VIEW COMPARISON:  02/10/2016 FINDINGS: No traumatic malalignment. Chronic disc space narrowing L3-4, L4-5 and L5-S1 with posterior decompression L4-5. Lower lumbar facet arthropathy. No evidence of fracture. IMPRESSION: No acute or traumatic finding. Previous decompression L4-5. Lower lumbar degenerative changes. Electronically Signed   By: [name] Chimes M.D.   On: 05/26/2017 15:13   Mr Lumbar Spine W Wo Contrast  Result Date: 05/26/2017 CLINICAL DATA:  Increased falls over the last 10 days. Anticoagulation. Tingling sensation along the right leg. Radiculopathy. EXAM: MRI LUMBAR SPINE WITHOUT AND WITH CONTRAST TECHNIQUE: Multiplanar and multiecho pulse sequences of the lumbar spine were obtained without and with intravenous contrast. CONTRAST:  90mL MULTIHANCE GADOBENATE DIMEGLUMINE 529 MG/ML IV SOLN COMPARISON:  Radiographs from 05/26/2017 FINDINGS: Segmentation: The lowest lumbar type non-rib-bearing vertebra is labeled as L5. Alignment:  3 mm degenerative anterolisthesis at L4-5. Vertebrae: Type 2 degenerative endplate findings at X9-3. Disc desiccation throughout the lumbar spine with loss of disc height most notable at L4-5, and vacuum disc phenomenon at L4-5 and L5-S1. Small focus of edema and enhancement in the left L5 pedicle most likely from adjacent degenerative facet arthropathy. Conus medullaris and cauda  equina: Conus extends to the T12-L1 level. Conus and cauda equina appear normal. Paraspinal and other soft tissues: We partially include a complex 1.7 cm in diameter lesion of the right kidney upper pole with low T2 and intermediate T1 signal characteristics, no obvious enhancement in the part of the lesion that we include on today's exam. Scarring in the right kidney lower pole posteriorly potentially with adjacent perirenal stranding. Fluid signal intensity cyst posteriorly in the right mid kidney. T2 hypointense lesion peripherally in the left mid kidney margin on image 6/4, nonspecific and only partially included. Behind the urinary bladder there is partial inclusion of a multilobular region which seems to be separate from the rectum, and probably represents a fibroid uterus. Back in 2003 the patient had an abdominal ultrasound on 01/31/2002 in which uterine fibroids were described. Disc levels: L1-2: Unremarkable. L2-3: No impingement.  Right greater than left facet arthropathy. L3-4: Moderate to prominent central narrowing of the thecal sac with mild bilateral subarticular lateral recess stenosis and borderline bilateral foraminal stenosis due to facet arthropathy, disc bulge, and left lateral recess and inferior foraminal disc protrusion. L4-5: Prominent right foraminal stenosis with moderate right and mild left subarticular lateral recess stenosis as well as moderate central narrowing of the thecal sac due to disc uncovering, central disc protrusion extending cephalad, facet arthropathy, and right foraminal disc protrusion. Prior posterior decompression at this level. L5-S1: Mild right foraminal stenosis due to disc bulge and facet arthropathy. Borderline left subarticular lateral recess stenosis. IMPRESSION: 1. Lumbar spondylosis and degenerative disc disease with prominent impingement at L4-5; moderate to prominent impingement at L3-4; and mild impingement at L5-S1, as detailed above. The patient has had  prior posterior decompression at the L4-5 level. 2. 1.7 cm complex  right renal lesion and a smaller complex left renal lesion, both only partly included are not fully imaged. Exam. These are statistically likely to be complex cysts, but both 3. Scarring of the right kidney lower pole. 4. Uterine fibroids. Electronically Signed   By: Van Clines M.D.   On: 05/26/2017 20:07   Dg Knee Complete 4 Views Right  Result Date: 05/26/2017 CLINICAL DATA:  72 y/o F; right leg pain from knee down. History of fall 3 times in past 2 days. EXAM: RIGHT KNEE - COMPLETE 4+ VIEW COMPARISON:  None. FINDINGS: No acute fracture or dislocation identified. Moderate joint effusion. Mild medial femorotibial compartment joint space narrowing. Small tricompartmental osteophytes. IMPRESSION: 1. No acute fracture or dislocation.  Moderate joint effusion. 2. Mild osteoarthrosis with medial femorotibial compartment joint space narrowing. Electronically Signed   By: Kristine Garbe M.D.   On: 05/26/2017 15:12   Dg Foot Complete Right  Result Date: 05/26/2017 CLINICAL DATA:  Falling.  Pain. EXAM: RIGHT FOOT COMPLETE - 3+ VIEW COMPARISON:  None. FINDINGS: Hallux valgus deformity. Acute fracture of the proximal phalanx of the great toe, nondisplaced. Possible acute fracture of the proximal aspect of the proximal phalanx of the second toe as well. Ordinary mild degenerative changes in the midfoot. IMPRESSION: Nondisplaced fracture of the proximal phalanx of the great toe. Hallux valgus deformity. Question minimal fracture of the proximal aspect of the proximal phalanx of the second toe. Electronically Signed   By: [name] Chimes M.D.   On: 05/26/2017 15:13   Dg Hip Unilat W Or Wo Pelvis 2-3 Views Right  Result Date: 05/26/2017 CLINICAL DATA:  Right leg pain.  Worsening falling. EXAM: DG HIP (WITH OR WITHOUT PELVIS) 2-3V RIGHT COMPARISON:  None. FINDINGS: Bilateral total hip replacements. Other bones of the pelvis appear normal.  No complications seen relating to the arthroplasty on the right. No traumatic finding. IMPRESSION: Negative. Electronically Signed   By: [name] Chimes M.D.   On: 05/26/2017 15:11    Procedures Procedures (including critical care time)  Medications Ordered in ED Medications  morphine 4 MG/ML injection 4 mg (4 mg Intravenous Given 05/26/17 1347)  sodium chloride 0.9 % bolus 500 mL (0 mLs Intravenous Stopped 05/26/17 1407)  gadobenate dimeglumine (MULTIHANCE) injection 18 mL (18 mLs Intravenous Contrast Given 05/26/17 1949)     Initial Impression / Assessment and Plan / ED Course  I have reviewed the triage vital signs and the nursing notes.  Pertinent labs & imaging results that were available during my care of the patient were reviewed by me and considered in my medical decision making (see chart for details).  Clinical Course as of May 28 827  Fri May 26, 2017  2022 Patient's prior back surgeon was Dr. Dellis Filbert bean orthopedics from [facility].  I paged him to see if he is available to help with the MRI findings.  [MB]  2047 I discussed with Dr. Bernadette Hoit partner and I reviewed the patient's findings with him.  He felt there was not an acute neurosurgical emergency of the patient will need to be intervened on currently and that she should go home and follow-up with Dr. being calling in the office on Monday.  He suggested steroids may be beneficial but the patient is on Xarelto and is a diabetic so I feel after discussion with the patient that she should hold off until Monday when she can review this with Dr. Maxie Better.  The patient is calling for a ride home.  [MB]  Clinical Course User Index [MB] Hayden Rasmussen, MD   [place] is a 72 y.o. female here with hip pain, back pain, R inner thigh paresthesia. She did have an injection to R hip but not to the back recently and I see no signs of cellulitis. She does have R inner thigh dec sensation and I wonder if this is saddle anesthesia or  radiculopathy given her previous lumbar surgeries. She is able to ambulate. Will get xrays, MRI lumbar spine. Afebrile, I doubt epidural abscess.   2:50 pm WBC 12. MRI at [facility] is broken today. Will transfer to the ED at [facility] for MRI lumbar spine. Dr. Gilford Raid accepted transfer.   3:30 pm Her xrays showed R big toe fracture. Postop shoe ordered. Patient has a cane I don't think she is steady enough for crutches. EMS to pick up patient and drive her to [facility] ED for MRI lumbar spine.    Final Clinical Impressions(s) / ED Diagnoses   Final diagnoses:  Numbness and tingling of right leg  Closed displaced fracture of proximal phalanx of right great toe, initial encounter  Lumbar radiculopathy    ED Discharge Orders    None       Drenda Freeze, MD 05/26/17 1505    Drenda Freeze, MD 05/26/17 1526    Drenda Freeze, MD 05/27/17 0830

## 2017-05-26 NOTE — ED Triage Notes (Signed)
Pt verbalizes increased falls post post receiving shot for RA 2/11. Complaint of right leg pain from knee down.

## 2017-05-26 NOTE — ED Provider Notes (Signed)
Patient transferred from [place] to [facility] for MRI.  Patient was seen in the ED at [facility] for increased falls over the last 10 days with her right knee giving out.  She is on Xarelto and she landed on her buttocks at that time.  She fell again today with her right knee gave out.  She states she is got some tingling on the inner half of her right leg.  She had seen her rheumatologist about 10 days ago and had an injection for some knee pain that involved a shot in her buttock.  Today she had some labs and x-ray imaging and was found to have a toe fracture.  There is no MRI available at [facility] today so she was transferred here for imaging.  She has been using a cane and a walker to get around.  She had a prior history of back surgery that she said her symptoms involve needing to lean over because of the pain and this is different.  On exam she is alert pleasant in no distress.  Her heart is regular lungs clear abdomen is soft.  She is got normal flexion and extension of her hip and knee.  Her right knee shows no significant effusion or erythema as per the hip too.   I reviewed the prior workup and am ordering her an MRI lumbar with and without.  Patient is not having any significant pain and appears comfortable.  MR Lumbar Spine W Wo Contrast  Final Result    DG Lumbar Spine Complete  Final Result    DG Foot Complete Right  Final Result    DG Knee Complete 4 Views Right  Final Result    DG Hip Unilat W or Wo Pelvis 2-3 Views Right  Final Result      Clinical Course as of May 27 1025  Fri May 26, 2017  2022 Patient's prior back surgeon was Dr. Dellis Filbert bean orthopedics from [place].  I paged him to see if he is available to help with the MRI findings.  [MB]  2047 I discussed with Dr. Bernadette Hoit partner and I reviewed the patient's findings with him.  He felt there was not an acute neurosurgical emergency of the patient will need to be intervened on currently and that she should  go home and follow-up with Dr. being calling in the office on Monday.  He suggested steroids may be beneficial but the patient is on Xarelto and is a diabetic so I feel after discussion with the patient that she should hold off until Monday when she can review this with Dr. Maxie Better.  The patient is calling for a ride home.  [MB]    Clinical Course User Index [MB] Hayden Rasmussen, MD      Hayden Rasmussen, MD 05/27/17 1028

## 2017-05-26 NOTE — ED Notes (Signed)
Pt arrived from [place] ed sent here for a mri  Multiple falls numbness rt midthigh to her rt toes since the last fall getting worse  Alert no distress

## 2017-05-26 NOTE — ED Notes (Signed)
[facility] called to transport to [facility] ED for MRI.

## 2017-05-26 NOTE — Discharge Instructions (Signed)
You should continue to use a walker or cane to support herself and limit your chance for fall.  Please call Dr. Tonita Cong  Monday for follow-up and further care.  Return to the emergency department if any worsening symptoms.

## 2017-05-26 NOTE — ED Notes (Signed)
The mri was contacted and they will be calling for her in  approx 2 hours

## 2017-05-30 DIAGNOSIS — M5417 Radiculopathy, lumbosacral region: Secondary | ICD-10-CM | POA: Diagnosis not present

## 2017-05-30 DIAGNOSIS — M48061 Spinal stenosis, lumbar region without neurogenic claudication: Secondary | ICD-10-CM | POA: Diagnosis not present

## 2017-05-30 DIAGNOSIS — M545 Low back pain: Secondary | ICD-10-CM | POA: Diagnosis not present

## 2017-05-31 ENCOUNTER — Emergency Department (HOSPITAL_COMMUNITY)
Admission: EM | Admit: 2017-05-31 | Discharge: 2017-06-01 | Disposition: A | Discharge status: Home / Self Care | Payer: Medicare Other | Attending: Emergency Medicine | Admitting: Emergency Medicine

## 2017-05-31 ENCOUNTER — Encounter (HOSPITAL_COMMUNITY): Payer: Self-pay

## 2017-05-31 DIAGNOSIS — E1122 Type 2 diabetes mellitus with diabetic chronic kidney disease: Secondary | ICD-10-CM | POA: Diagnosis not present

## 2017-05-31 DIAGNOSIS — M25561 Pain in right knee: Secondary | ICD-10-CM | POA: Diagnosis not present

## 2017-05-31 DIAGNOSIS — I5032 Chronic diastolic (congestive) heart failure: Secondary | ICD-10-CM | POA: Insufficient documentation

## 2017-05-31 DIAGNOSIS — I130 Hypertensive heart and chronic kidney disease with heart failure and stage 1 through stage 4 chronic kidney disease, or unspecified chronic kidney disease: Secondary | ICD-10-CM | POA: Diagnosis not present

## 2017-05-31 DIAGNOSIS — Z79899 Other long term (current) drug therapy: Secondary | ICD-10-CM | POA: Diagnosis not present

## 2017-05-31 DIAGNOSIS — Z96643 Presence of artificial hip joint, bilateral: Secondary | ICD-10-CM | POA: Diagnosis not present

## 2017-05-31 DIAGNOSIS — F419 Anxiety disorder, unspecified: Secondary | ICD-10-CM | POA: Diagnosis not present

## 2017-05-31 DIAGNOSIS — M25569 Pain in unspecified knee: Secondary | ICD-10-CM | POA: Diagnosis not present

## 2017-05-31 DIAGNOSIS — Z7984 Long term (current) use of oral hypoglycemic drugs: Secondary | ICD-10-CM | POA: Insufficient documentation

## 2017-05-31 DIAGNOSIS — F329 Major depressive disorder, single episode, unspecified: Secondary | ICD-10-CM | POA: Insufficient documentation

## 2017-05-31 DIAGNOSIS — Z7901 Long term (current) use of anticoagulants: Secondary | ICD-10-CM | POA: Insufficient documentation

## 2017-05-31 DIAGNOSIS — N183 Chronic kidney disease, stage 3 (moderate): Secondary | ICD-10-CM | POA: Diagnosis not present

## 2017-05-31 DIAGNOSIS — W19XXXA Unspecified fall, initial encounter: Secondary | ICD-10-CM

## 2017-05-31 DIAGNOSIS — T148XXA Other injury of unspecified body region, initial encounter: Secondary | ICD-10-CM | POA: Diagnosis not present

## 2017-05-31 DIAGNOSIS — M25461 Effusion, right knee: Secondary | ICD-10-CM | POA: Diagnosis not present

## 2017-05-31 NOTE — ED Triage Notes (Signed)
Pt reports "knee just gave out" while getting ready for bed, hx of 5 falls since 2/11, currently being worked up for "nerve in back" that she believes has caused her falls. Pt denies striking head w/ fall. Reports 6/10 R knee pain w/ minor swelling, TTP. Pt received 32mcg of fentanyl by EMS w/ transient hypotension to 90 SBP, on arrival 124/93. A&Ox4. Anticoagulated w/ xarelto for Afib

## 2017-06-01 ENCOUNTER — Emergency Department (HOSPITAL_COMMUNITY): Payer: Medicare Other

## 2017-06-01 ENCOUNTER — Telehealth: Payer: Self-pay | Admitting: *Deleted

## 2017-06-01 DIAGNOSIS — M25561 Pain in right knee: Secondary | ICD-10-CM | POA: Diagnosis not present

## 2017-06-01 DIAGNOSIS — M25461 Effusion, right knee: Secondary | ICD-10-CM | POA: Diagnosis not present

## 2017-06-01 MED ORDER — HYDROCODONE-ACETAMINOPHEN 5-325 MG PO TABS
1.0000 | ORAL_TABLET | Freq: Once | ORAL | Status: AC
Start: 2017-06-01 — End: 2017-06-01
  Administered 2017-06-01: 1 via ORAL
  Filled 2017-06-01: qty 1

## 2017-06-01 NOTE — ED Notes (Addendum)
Pt. Ambulated in hallway with one person assist, and tolerated it well but stated "she felt off balance."

## 2017-06-01 NOTE — Telephone Encounter (Signed)
Ihsan Nomura J. Jobe Mutch, RN, BSN, NCM [phone] Spoke with pt at bedside regarding discharge planning for Home Health Services. Offered pt list of home health agencies to choose from.  Pt chose [facility] to render services. Drew of [facility] notified. Patient made aware that [facility] will be in contact in 24-48 hours.  No DME needs identified at this time.  

## 2017-06-01 NOTE — ED Provider Notes (Signed)
[facility]PART[facility] Provider Note   CSN: [id number] Arrival date & time: 05/31/17  2345     History   Chief Complaint Chief Complaint  Patient presents with  . Fall  . Knee Pain    HPI [name] is a 72 y.o. female.  The history is provided by the patient.  Fall  This is a new problem. Episode onset: just Prior to arrival. The problem occurs constantly. The problem has been gradually worsening. Pertinent negatives include no chest pain, no abdominal pain and no shortness of breath. The symptoms are aggravated by walking. The symptoms are relieved by rest.  Knee Pain    History of anemia, atrial fibrillation, presents with right knee pain.  She reports she is getting ready for bed when she fell. She reports she stumbled, because of the pain.  She reports she injured her right knee, and has injured this previously.  Denies hitting her head or loss of conscious.  No neck or back pain.  No chest pain or abdominal pain. She uses a walker as needed  Past Medical History:  Diagnosis Date  . Actinic keratosis   . Anemia    hx of  . Anemia of chronic renal failure, stage 3 (moderate) ([place]) 07/17/2015  . Anticoagulant causing adverse effect in therapeutic use 07/17/2015  . Atrial fibrillation ([name])    a. persistent, she remains on Xarelto  . Carpal tunnel syndrome   . Chronic diastolic (congestive) heart failure ([place])    a. 04/2016: echo showing a preserved EF of 55-60% with mild MR. LA and RA midly dilated.   . Edema    left leg at ankle resolved now  . Gastroesophageal reflux disease   . Hepatitis 1970   not sure what kind  . Hiatal hernia   . Hyperlipidemia   . Hypertension   . Iron deficiency anemia due to chronic blood loss 07/17/2015  . Jaundice    age 84  . Peripheral vascular disease ([place]) yrs ago   DVT left lower leg questionale told by 2 drs i had no clot, 1 md said i did  . Rheumatoid arthritis(714.0)   . Type II diabetes  mellitus ([place])    type2    Patient Active Problem List   Diagnosis Date Noted  . Type II diabetes mellitus ([place])   . Peripheral vascular disease ([place])   . Hyperlipidemia   . Hiatal hernia   . Edema   . Chronic diastolic (congestive) heart failure ([place])   . Carpal tunnel syndrome   . Atrial fibrillation ([place])   . Anemia   . Actinic keratosis   . Intractable nausea and vomiting 05/31/2016  . Nausea & vomiting 05/30/2016  . Abnormal chest x-ray 05/30/2016  . Viral infection   . Diabetes mellitus with complication ([place])   . Influenza A 05/21/2016  . Chronic atrial fibrillation ([place]) 05/20/2016  . Acute CHF (congestive heart failure) ([place]) 05/20/2016  . History of GI bleed 03/29/2016  . Chronic anticoagulation 03/29/2016  . Symptomatic anemia 03/19/2016  . Spinal stenosis of lumbar region 02/10/2016  . Varicose veins of left leg with edema 09/15/2015  . Preop cardiovascular exam 08/25/2015  . Iron deficiency anemia due to chronic blood loss 07/17/2015  . Anemia of chronic renal failure, stage 3 (moderate) ([place]) 07/17/2015  . Anticoagulant causing adverse effect in therapeutic use 07/17/2015  . Symptomatic bradycardia 02/21/2015  . Bradycardia   . Acute deep vein thrombosis (DVT) of left lower  extremity ([place]) 05/06/2014  . Abdominal wall mass of right lower quadrant 07/08/2013  . OA (osteoarthritis) of hip 04/03/2013  . Osteoarthritis of hip 01/18/2013  . Night sweats 09/06/2012  . Chest pain   . Anxiety and depression   . Gastroesophageal reflux disease   . Hepatitis   . Jaundice   . Hypertension 07/01/2010  . Hypercholesterolemia 07/01/2010  . DM2 (diabetes mellitus, type 2) ([place]) 07/01/2010  . Rheumatoid arthritis ([place]) 07/01/2010  . Sinus tachycardia 07/01/2010  . Chest pain, atypical 07/01/2010    Past Surgical History:  Procedure Laterality Date  . CATARACT EXTRACTION Bilateral   . COLONOSCOPY WITH PROPOFOL N/A 02/20/2015   Procedure: COLONOSCOPY WITH  PROPOFOL;  Surgeon: Carol Ada, MD;  Location: [facility];  Service: Endoscopy;  Laterality: N/A;  . ENTEROSCOPY N/A 03/23/2016   Procedure: ENTEROSCOPY;  Surgeon: Carol Ada, MD;  Location: [facility];  Service: Endoscopy;  Laterality: N/A;  . ENTEROSCOPY N/A 06/17/2016   Procedure: ENTEROSCOPY;  Surgeon: Carol Ada, MD;  Location: [facility];  Service: Endoscopy;  Laterality: N/A;  . ESOPHAGOGASTRODUODENOSCOPY (EGD) WITH PROPOFOL N/A 02/20/2015   Procedure: ESOPHAGOGASTRODUODENOSCOPY (EGD) WITH PROPOFOL;  Surgeon: Carol Ada, MD;  Location: [facility];  Service: Endoscopy;  Laterality: N/A;  . GIVENS CAPSULE STUDY N/A 03/21/2016   Procedure: GIVENS CAPSULE STUDY;  Surgeon: Carol Ada, MD;  Location: [facility];  Service: Endoscopy;  Laterality: N/A;  . HAND SURGERY  1995 and 1996   artificial joints both hands  . JOINT REPLACEMENT    . LUMBAR LAMINECTOMY/DECOMPRESSION MICRODISCECTOMY N/A 02/10/2016   Procedure: MICROLUMBAR DECOMPRESSION L4-L5 AND L3- L4, AND EXCISION OF SYNOVIAL CYST L4-L5;  Surgeon: Susa Day, MD;  Location: [facility];  Service: Orthopedics;  Laterality: N/A;  . TOTAL HIP ARTHROPLASTY Left 2009  . TOTAL HIP ARTHROPLASTY Right 04/03/2013   Procedure: RIGHT TOTAL HIP ARTHROPLASTY ANTERIOR APPROACH;  Surgeon: Gearlean Alf, MD;  Location: [facility];  Service: Orthopedics;  Laterality: Right;    OB History    No data available       Home Medications    Prior to Admission medications   Medication Sig Start Date End Date Taking? Authorizing Provider  Abatacept (ORENCIA CLICKJECT) 400 MG/ML SOAJ Inject 125 mg into the skin every Thursday.     [provider]  buPROPion (WELLBUTRIN SR) 150 MG 12 hr tablet Take 150 mg by mouth 2 (two) times daily.      [provider]  Cholecalciferol (VITAMIN D3) 1000 units CAPS Take 1,000 Units by mouth daily.    [provider]  Cyanocobalamin (VITAMIN B 12 PO) Take 1,000 mcg by mouth daily.      [provider]  diltiazem (CARDIZEM CD) 360 MG 24 hr capsule TAKE 1 CAPSULE (360 MG TOTAL) BY MOUTH DAILY. 05/02/17   Lelon Perla, MD  esomeprazole (NEXIUM) 40 MG capsule Take 40 mg by mouth every morning.     [provider]  ferrous gluconate (FERGON) 324 MG tablet Take 1 tablet (324 mg total) by mouth 2 (two) times daily with a meal. 05/17/16   Debbe Odea, MD  furosemide (LASIX) 20 MG tablet Take 2 tablets (40 mg total) by mouth daily. Take 2 tablets by mouth daily. TAKE AN ADDITIONAL 1 TAB AS NEEDED 03/27/17   Lelon Perla, MD  metFORMIN (GLUCOPHAGE-XR) 500 MG 24 hr tablet Take 1,000 mg by mouth 2 (two) times daily.    [provider]  metoprolol tartrate (LOPRESSOR) 50 MG tablet TAKE  2 TABLETS (100 MG TOTAL) BY MOUTH 2 (TWO) TIMES DAILY. 09/09/16   Erlene Quan, PA-C  Multiple Vitamins-Minerals (PRESERVISION AREDS 2) CAPS Take 1 capsule by mouth 2 (two) times daily.    [provider]  nitroGLYCERIN (NITROSTAT) 0.4 MG SL tablet Place 1 tablet (0.4 mg total) under the tongue every 5 (five) minutes as needed for chest pain (x 3 doses). 11/28/16   Lelon Perla, MD  Omega 3 1200 MG CAPS Take 1,200 mg by mouth daily.     [provider]  potassium chloride (KLOR-CON 10) 10 MEQ tablet Take 2 tablets (20 mEq total) 2 (two) times daily by mouth. 02/17/17   Crenshaw, Denice Bors, MD  predniSONE (DELTASONE) 5 MG tablet Take 5 mg by mouth daily.    [provider]  rivaroxaban (XARELTO) 20 MG TABS tablet Take 20 mg by mouth daily with supper.    [provider]  rosuvastatin (CRESTOR) 10 MG tablet Take 10 mg by mouth every evening.     [provider]    Family History Family History  Problem Relation Age of Onset  . Stroke Father   . Heart failure Sister   . Hypertension Sister   . Heart attack Neg Hx     Social History Social History   Tobacco Use  . Smoking status: Never Smoker  . Smokeless tobacco:  Never Used  Substance Use Topics  . Alcohol use: No    Alcohol/week: 0.0 oz  . Drug use: No     Allergies   Patient has no known allergies.   Review of Systems Review of Systems  Constitutional: Negative for fever.  Respiratory: Negative for shortness of breath.   Cardiovascular: Negative for chest pain.  Gastrointestinal: Negative for abdominal pain.  Musculoskeletal: Positive for arthralgias and joint swelling. Negative for back pain and neck pain.  Neurological: Negative for syncope and weakness.  All other systems reviewed and are negative.    Physical Exam Updated Vital Signs BP (!) 124/93 (BP Location: Left Arm)   Pulse (!) 45   Temp 98.4 F (36.9 C) (Oral)   Resp 18   Ht 1.702 m (5\' 7" )   Wt 81.6 kg (180 lb)   SpO2 99%   BMI 28.19 kg/m   Physical Exam  CONSTITUTIONAL: Elderly, no acute distress HEAD: Normocephalic/atraumatic EYES: EOMI/PERRL ENMT: Mucous membranes moist NECK: supple no meningeal signs SPINE/BACK:entire spine nontender CV: Irregular LUNGS: Lungs are clear to auscultation bilaterally, no apparent distress ABDOMEN: soft, nontender GU:no cva tenderness NEURO: Pt is awake/alert/appropriate, moves all extremitiesx4.  No facial droop.    equal distal motor 5/5 strength noted with the following: hip flexion/knee flexion/extension, ankle dorsi/plantar flexion, great toe extension intact bilaterally, no sensory deficit in any dermatome.  EXTREMITIES: pulses normal/equal, full ROM Mild tenderness and swelling to right patella.  Bruising noted to right toe from previous injury.  Pelvis stable.  All other extremities/joints palpated/ranged and nontender SKIN: warm, color normal PSYCH: no abnormalities of mood noted, alert and oriented to situation   ED Treatments / Results  Labs (all labs ordered are listed, but only abnormal results are displayed) Labs Reviewed - No data to display  EKG  EKG Interpretation None       Radiology Dg Knee  Ap/lat W/sunrise Right  Result Date: 06/01/2017 CLINICAL DATA:  72 y/o F; multiple falls, unable to bear weight on right leg, swelling on medial side of right knee. EXAM: RIGHT KNEE 3 VIEWS COMPARISON:  05/26/2017  right knee radiographs FINDINGS: No acute fracture or dislocation identified. Moderate joint effusion. Mild medial femorotibial compartment joint space narrowing. Small tricompartmental osteophytes. Prominent spurring of tibial spines. IMPRESSION: 1. No acute fracture or dislocation identified. 2. Stable moderate joint effusion. 3. Osteoarthrosis with mild medial femorotibial compartment joint space narrowing and tricompartmental osteophytosis. Electronically Signed   By: Kristine Garbe M.D.   On: 06/01/2017 01:00    Procedures Procedures (including critical care time)  Medications Ordered in ED Medications  HYDROcodone-acetaminophen (NORCO/VICODIN) 5-325 MG per tablet 1 tablet (1 tablet Oral Given 06/01/17 0151)     Initial Impression / Assessment and Plan / ED Course  I have reviewed the triage vital signs and the nursing notes.  Pertinent imaging results that were available during my care of the patient were reviewed by me and considered in my medical decision making (see chart for details).     Presents after mechanical fall.  With right knee pain.  No other signs of trauma.  Will obtain imaging of right knee.  She has had multiple falls recently and has been getting worked up previously. Per EMS, she had a transient episode of hypotension that has since resolved.  Blood pressure is now above 100. 3:50 AM Patient in no distress.  Vitals improved.  She is ambulatory, at baseline.  She can use a walker, no ataxia. She denies any recent incontinence.  No new back pain.  On my exam, there is no focal weakness in her lower extremities  Recent MRI of lumbar spine did reveal some foraminal stenosis.  This could be causing some of her symptoms.  However she is ambulatory, no  focal weakness at this time.  I have ordered home health for her. She is appropriate for discharge home 4:14 AM Discussed plan with patient.  Again she is well-appearing, no focal motor deficits noted  my suspicion for acute neurologic emergency is low.  She is ambulatory here.  She has follow-up with her orthopedist next week.  She reports she has hydrocodone, prednisone, Neurontin for use at home.  Advised her to continue using her walker.  Home health orders have been placed.  Final Clinical Impressions(s) / ED Diagnoses   Final diagnoses:  Fall, initial encounter  Acute pain of right knee    ED Discharge Orders    None       Ripley Fraise, MD 06/01/17 [phone]

## 2017-06-06 DIAGNOSIS — Z7952 Long term (current) use of systemic steroids: Secondary | ICD-10-CM | POA: Diagnosis not present

## 2017-06-06 DIAGNOSIS — Z96641 Presence of right artificial hip joint: Secondary | ICD-10-CM | POA: Diagnosis not present

## 2017-06-06 DIAGNOSIS — Z7984 Long term (current) use of oral hypoglycemic drugs: Secondary | ICD-10-CM | POA: Diagnosis not present

## 2017-06-06 DIAGNOSIS — M48061 Spinal stenosis, lumbar region without neurogenic claudication: Secondary | ICD-10-CM | POA: Diagnosis not present

## 2017-06-06 DIAGNOSIS — Z7901 Long term (current) use of anticoagulants: Secondary | ICD-10-CM | POA: Diagnosis not present

## 2017-06-06 DIAGNOSIS — M1711 Unilateral primary osteoarthritis, right knee: Secondary | ICD-10-CM | POA: Diagnosis not present

## 2017-06-06 DIAGNOSIS — I5022 Chronic systolic (congestive) heart failure: Secondary | ICD-10-CM | POA: Diagnosis not present

## 2017-06-06 DIAGNOSIS — M4807 Spinal stenosis, lumbosacral region: Secondary | ICD-10-CM | POA: Diagnosis not present

## 2017-06-06 DIAGNOSIS — N183 Chronic kidney disease, stage 3 (moderate): Secondary | ICD-10-CM | POA: Diagnosis not present

## 2017-06-06 DIAGNOSIS — I481 Persistent atrial fibrillation: Secondary | ICD-10-CM | POA: Diagnosis not present

## 2017-06-06 DIAGNOSIS — E1122 Type 2 diabetes mellitus with diabetic chronic kidney disease: Secondary | ICD-10-CM | POA: Diagnosis not present

## 2017-06-06 DIAGNOSIS — M25561 Pain in right knee: Secondary | ICD-10-CM | POA: Diagnosis not present

## 2017-06-06 DIAGNOSIS — I130 Hypertensive heart and chronic kidney disease with heart failure and stage 1 through stage 4 chronic kidney disease, or unspecified chronic kidney disease: Secondary | ICD-10-CM | POA: Diagnosis not present

## 2017-06-06 DIAGNOSIS — M545 Low back pain: Secondary | ICD-10-CM | POA: Diagnosis not present

## 2017-06-06 DIAGNOSIS — Z96642 Presence of left artificial hip joint: Secondary | ICD-10-CM | POA: Diagnosis not present

## 2017-06-06 DIAGNOSIS — M179 Osteoarthritis of knee, unspecified: Secondary | ICD-10-CM | POA: Diagnosis not present

## 2017-06-07 DIAGNOSIS — I130 Hypertensive heart and chronic kidney disease with heart failure and stage 1 through stage 4 chronic kidney disease, or unspecified chronic kidney disease: Secondary | ICD-10-CM | POA: Diagnosis not present

## 2017-06-07 DIAGNOSIS — E1122 Type 2 diabetes mellitus with diabetic chronic kidney disease: Secondary | ICD-10-CM | POA: Diagnosis not present

## 2017-06-07 DIAGNOSIS — M4807 Spinal stenosis, lumbosacral region: Secondary | ICD-10-CM | POA: Diagnosis not present

## 2017-06-07 DIAGNOSIS — I5022 Chronic systolic (congestive) heart failure: Secondary | ICD-10-CM | POA: Diagnosis not present

## 2017-06-07 DIAGNOSIS — N183 Chronic kidney disease, stage 3 (moderate): Secondary | ICD-10-CM | POA: Diagnosis not present

## 2017-06-07 DIAGNOSIS — M25561 Pain in right knee: Secondary | ICD-10-CM | POA: Diagnosis not present

## 2017-06-09 DIAGNOSIS — E1122 Type 2 diabetes mellitus with diabetic chronic kidney disease: Secondary | ICD-10-CM | POA: Diagnosis not present

## 2017-06-09 DIAGNOSIS — I130 Hypertensive heart and chronic kidney disease with heart failure and stage 1 through stage 4 chronic kidney disease, or unspecified chronic kidney disease: Secondary | ICD-10-CM | POA: Diagnosis not present

## 2017-06-09 DIAGNOSIS — N183 Chronic kidney disease, stage 3 (moderate): Secondary | ICD-10-CM | POA: Diagnosis not present

## 2017-06-09 DIAGNOSIS — M4807 Spinal stenosis, lumbosacral region: Secondary | ICD-10-CM | POA: Diagnosis not present

## 2017-06-09 DIAGNOSIS — I5022 Chronic systolic (congestive) heart failure: Secondary | ICD-10-CM | POA: Diagnosis not present

## 2017-06-09 DIAGNOSIS — M25561 Pain in right knee: Secondary | ICD-10-CM | POA: Diagnosis not present

## 2017-06-13 DIAGNOSIS — N183 Chronic kidney disease, stage 3 (moderate): Secondary | ICD-10-CM | POA: Diagnosis not present

## 2017-06-13 DIAGNOSIS — I130 Hypertensive heart and chronic kidney disease with heart failure and stage 1 through stage 4 chronic kidney disease, or unspecified chronic kidney disease: Secondary | ICD-10-CM | POA: Diagnosis not present

## 2017-06-13 DIAGNOSIS — M4807 Spinal stenosis, lumbosacral region: Secondary | ICD-10-CM | POA: Diagnosis not present

## 2017-06-13 DIAGNOSIS — I5022 Chronic systolic (congestive) heart failure: Secondary | ICD-10-CM | POA: Diagnosis not present

## 2017-06-13 DIAGNOSIS — M25561 Pain in right knee: Secondary | ICD-10-CM | POA: Diagnosis not present

## 2017-06-13 DIAGNOSIS — E1122 Type 2 diabetes mellitus with diabetic chronic kidney disease: Secondary | ICD-10-CM | POA: Diagnosis not present

## 2017-06-14 ENCOUNTER — Other Ambulatory Visit: Payer: Self-pay

## 2017-06-14 ENCOUNTER — Inpatient Hospital Stay (HOSPITAL_COMMUNITY): Payer: Medicare Other

## 2017-06-14 ENCOUNTER — Emergency Department (HOSPITAL_COMMUNITY): Payer: Medicare Other

## 2017-06-14 ENCOUNTER — Encounter (HOSPITAL_COMMUNITY): Payer: Self-pay

## 2017-06-14 ENCOUNTER — Inpatient Hospital Stay (HOSPITAL_COMMUNITY)
Admission: EM | Admit: 2017-06-14 | Discharge: 2017-06-19 | DRG: 065 | Disposition: A | Discharge status: Rehabilitation Facility | Payer: Medicare Other | Attending: Family Medicine | Admitting: Family Medicine

## 2017-06-14 DIAGNOSIS — R339 Retention of urine, unspecified: Secondary | ICD-10-CM | POA: Diagnosis not present

## 2017-06-14 DIAGNOSIS — Z9842 Cataract extraction status, left eye: Secondary | ICD-10-CM

## 2017-06-14 DIAGNOSIS — I130 Hypertensive heart and chronic kidney disease with heart failure and stage 1 through stage 4 chronic kidney disease, or unspecified chronic kidney disease: Secondary | ICD-10-CM | POA: Diagnosis present

## 2017-06-14 DIAGNOSIS — E781 Pure hyperglyceridemia: Secondary | ICD-10-CM | POA: Diagnosis present

## 2017-06-14 DIAGNOSIS — I110 Hypertensive heart disease with heart failure: Secondary | ICD-10-CM | POA: Diagnosis not present

## 2017-06-14 DIAGNOSIS — R4189 Other symptoms and signs involving cognitive functions and awareness: Secondary | ICD-10-CM | POA: Diagnosis not present

## 2017-06-14 DIAGNOSIS — I481 Persistent atrial fibrillation: Secondary | ICD-10-CM | POA: Diagnosis not present

## 2017-06-14 DIAGNOSIS — F419 Anxiety disorder, unspecified: Secondary | ICD-10-CM | POA: Diagnosis present

## 2017-06-14 DIAGNOSIS — I69398 Other sequelae of cerebral infarction: Secondary | ICD-10-CM | POA: Diagnosis not present

## 2017-06-14 DIAGNOSIS — E041 Nontoxic single thyroid nodule: Secondary | ICD-10-CM

## 2017-06-14 DIAGNOSIS — E11319 Type 2 diabetes mellitus with unspecified diabetic retinopathy without macular edema: Secondary | ICD-10-CM | POA: Diagnosis not present

## 2017-06-14 DIAGNOSIS — E1169 Type 2 diabetes mellitus with other specified complication: Secondary | ICD-10-CM

## 2017-06-14 DIAGNOSIS — G8929 Other chronic pain: Secondary | ICD-10-CM | POA: Diagnosis present

## 2017-06-14 DIAGNOSIS — Z7984 Long term (current) use of oral hypoglycemic drugs: Secondary | ICD-10-CM | POA: Diagnosis not present

## 2017-06-14 DIAGNOSIS — D509 Iron deficiency anemia, unspecified: Secondary | ICD-10-CM | POA: Diagnosis present

## 2017-06-14 DIAGNOSIS — H538 Other visual disturbances: Secondary | ICD-10-CM | POA: Diagnosis present

## 2017-06-14 DIAGNOSIS — I639 Cerebral infarction, unspecified: Secondary | ICD-10-CM | POA: Diagnosis present

## 2017-06-14 DIAGNOSIS — Z7901 Long term (current) use of anticoagulants: Secondary | ICD-10-CM

## 2017-06-14 DIAGNOSIS — R29702 NIHSS score 2: Secondary | ICD-10-CM | POA: Diagnosis present

## 2017-06-14 DIAGNOSIS — Z86718 Personal history of other venous thrombosis and embolism: Secondary | ICD-10-CM | POA: Diagnosis not present

## 2017-06-14 DIAGNOSIS — G4489 Other headache syndrome: Secondary | ICD-10-CM | POA: Diagnosis not present

## 2017-06-14 DIAGNOSIS — E1151 Type 2 diabetes mellitus with diabetic peripheral angiopathy without gangrene: Secondary | ICD-10-CM | POA: Diagnosis present

## 2017-06-14 DIAGNOSIS — F329 Major depressive disorder, single episode, unspecified: Secondary | ICD-10-CM | POA: Diagnosis not present

## 2017-06-14 DIAGNOSIS — R51 Headache: Secondary | ICD-10-CM | POA: Diagnosis not present

## 2017-06-14 DIAGNOSIS — Z79899 Other long term (current) drug therapy: Secondary | ICD-10-CM

## 2017-06-14 DIAGNOSIS — R52 Pain, unspecified: Secondary | ICD-10-CM | POA: Diagnosis not present

## 2017-06-14 DIAGNOSIS — N183 Chronic kidney disease, stage 3 (moderate): Secondary | ICD-10-CM | POA: Diagnosis present

## 2017-06-14 DIAGNOSIS — Z96643 Presence of artificial hip joint, bilateral: Secondary | ICD-10-CM | POA: Diagnosis present

## 2017-06-14 DIAGNOSIS — M47816 Spondylosis without myelopathy or radiculopathy, lumbar region: Secondary | ICD-10-CM | POA: Diagnosis present

## 2017-06-14 DIAGNOSIS — K219 Gastro-esophageal reflux disease without esophagitis: Secondary | ICD-10-CM | POA: Diagnosis present

## 2017-06-14 DIAGNOSIS — Z9841 Cataract extraction status, right eye: Secondary | ICD-10-CM

## 2017-06-14 DIAGNOSIS — M069 Rheumatoid arthritis, unspecified: Secondary | ICD-10-CM | POA: Diagnosis not present

## 2017-06-14 DIAGNOSIS — Z8673 Personal history of transient ischemic attack (TIA), and cerebral infarction without residual deficits: Secondary | ICD-10-CM

## 2017-06-14 DIAGNOSIS — I10 Essential (primary) hypertension: Secondary | ICD-10-CM | POA: Diagnosis present

## 2017-06-14 DIAGNOSIS — I340 Nonrheumatic mitral (valve) insufficiency: Secondary | ICD-10-CM

## 2017-06-14 DIAGNOSIS — I63431 Cerebral infarction due to embolism of right posterior cerebral artery: Principal | ICD-10-CM | POA: Diagnosis present

## 2017-06-14 DIAGNOSIS — H53462 Homonymous bilateral field defects, left side: Secondary | ICD-10-CM | POA: Diagnosis present

## 2017-06-14 DIAGNOSIS — E1122 Type 2 diabetes mellitus with diabetic chronic kidney disease: Secondary | ICD-10-CM | POA: Diagnosis present

## 2017-06-14 DIAGNOSIS — I63411 Cerebral infarction due to embolism of right middle cerebral artery: Secondary | ICD-10-CM | POA: Diagnosis not present

## 2017-06-14 DIAGNOSIS — E119 Type 2 diabetes mellitus without complications: Secondary | ICD-10-CM

## 2017-06-14 DIAGNOSIS — I69313 Psychomotor deficit following cerebral infarction: Secondary | ICD-10-CM | POA: Diagnosis not present

## 2017-06-14 DIAGNOSIS — I5032 Chronic diastolic (congestive) heart failure: Secondary | ICD-10-CM | POA: Diagnosis present

## 2017-06-14 DIAGNOSIS — R269 Unspecified abnormalities of gait and mobility: Secondary | ICD-10-CM | POA: Diagnosis not present

## 2017-06-14 DIAGNOSIS — M5136 Other intervertebral disc degeneration, lumbar region: Secondary | ICD-10-CM | POA: Diagnosis present

## 2017-06-14 DIAGNOSIS — I4891 Unspecified atrial fibrillation: Secondary | ICD-10-CM | POA: Diagnosis not present

## 2017-06-14 DIAGNOSIS — E876 Hypokalemia: Secondary | ICD-10-CM | POA: Diagnosis not present

## 2017-06-14 DIAGNOSIS — R414 Neurologic neglect syndrome: Secondary | ICD-10-CM | POA: Diagnosis not present

## 2017-06-14 DIAGNOSIS — I69312 Visuospatial deficit and spatial neglect following cerebral infarction: Secondary | ICD-10-CM | POA: Diagnosis not present

## 2017-06-14 DIAGNOSIS — I482 Chronic atrial fibrillation: Secondary | ICD-10-CM | POA: Diagnosis present

## 2017-06-14 HISTORY — DX: Pain in right knee: M25.561

## 2017-06-14 LAB — COMPREHENSIVE METABOLIC PANEL
ALBUMIN: 3.2 g/dL — AB (ref 3.5–5.0)
ALK PHOS: 131 U/L — AB (ref 38–126)
ALT: 14 U/L (ref 14–54)
AST: 15 U/L (ref 15–41)
Anion gap: 11 (ref 5–15)
BILIRUBIN TOTAL: 0.7 mg/dL (ref 0.3–1.2)
BUN: 18 mg/dL (ref 6–20)
CALCIUM: 9.2 mg/dL (ref 8.9–10.3)
CO2: 23 mmol/L (ref 22–32)
CREATININE: 0.83 mg/dL (ref 0.44–1.00)
Chloride: 107 mmol/L (ref 101–111)
GFR calc Af Amer: >60 mL/min (ref >60)
GLUCOSE: 123 mg/dL — AB (ref 65–99)
Potassium: 3.5 mmol/L (ref 3.5–5.1)
Sodium: 141 mmol/L (ref 135–145)
TOTAL PROTEIN: 6.3 g/dL — AB (ref 6.5–8.1)

## 2017-06-14 LAB — DIFFERENTIAL
BASOS ABS: 0 10*3/uL (ref 0.0–0.1)
Basophils Relative: 0 %
Eosinophils Absolute: 0.2 10*3/uL (ref 0.0–0.7)
Eosinophils Relative: 2 %
LYMPHS ABS: 1.6 10*3/uL (ref 0.7–4.0)
LYMPHS PCT: 15 %
MONOS PCT: 12 %
Monocytes Absolute: 1.3 10*3/uL — ABNORMAL HIGH (ref 0.1–1.0)
NEUTROS ABS: 7.7 10*3/uL (ref 1.7–7.7)
NEUTROS PCT: 71 %

## 2017-06-14 LAB — APTT: APTT: 24 s (ref 24–36)

## 2017-06-14 LAB — ETHANOL: Alcohol, Ethyl (B): <10 mg/dL (ref <10)

## 2017-06-14 LAB — CBC
HEMATOCRIT: 39.4 % (ref 36.0–46.0)
HEMOGLOBIN: 11.8 g/dL — AB (ref 12.0–15.0)
MCH: 23.5 pg — ABNORMAL LOW (ref 26.0–34.0)
MCHC: 29.9 g/dL — ABNORMAL LOW (ref 30.0–36.0)
MCV: 78.5 fL (ref 78.0–100.0)
Platelets: 293 10*3/uL (ref 150–400)
RBC: 5.02 MIL/uL (ref 3.87–5.11)
RDW: 17.5 % — ABNORMAL HIGH (ref 11.5–15.5)
WBC: 10.9 10*3/uL — ABNORMAL HIGH (ref 4.0–10.5)

## 2017-06-14 LAB — I-STAT CHEM 8, ED
BUN: 20 mg/dL (ref 6–20)
CREATININE: 0.7 mg/dL (ref 0.44–1.00)
Calcium, Ion: 1.01 mmol/L — ABNORMAL LOW (ref 1.15–1.40)
Chloride: 107 mmol/L (ref 101–111)
GLUCOSE: 120 mg/dL — AB (ref 65–99)
HCT: 38 % (ref 36.0–46.0)
HEMOGLOBIN: 12.9 g/dL (ref 12.0–15.0)
POTASSIUM: 3.5 mmol/L (ref 3.5–5.1)
Sodium: 142 mmol/L (ref 135–145)
TCO2: 23 mmol/L (ref 22–32)

## 2017-06-14 LAB — I-STAT TROPONIN, ED: TROPONIN I, POC: 0 ng/mL (ref 0.00–0.08)

## 2017-06-14 LAB — CBG MONITORING, ED: GLUCOSE-CAPILLARY: 119 mg/dL — AB (ref 65–99)

## 2017-06-14 LAB — PROTIME-INR
INR: 1.09
Prothrombin Time: 14 seconds (ref 11.4–15.2)

## 2017-06-14 MED ORDER — SENNOSIDES-DOCUSATE SODIUM 8.6-50 MG PO TABS: 1.0000 | ORAL_TABLET | Freq: Every evening | ORAL | Status: DC | PRN

## 2017-06-14 MED ORDER — ROSUVASTATIN CALCIUM 10 MG PO TABS
10.0000 mg | ORAL_TABLET | Freq: Every evening | ORAL | Status: DC
  Administered 2017-06-15: 10 mg via ORAL
  Filled 2017-06-14: qty 1

## 2017-06-14 MED ORDER — FENTANYL CITRATE (PF) 100 MCG/2ML IJ SOLN
25.0000 ug | Freq: Once | INTRAMUSCULAR | Status: AC
  Administered 2017-06-14: 25 ug via INTRAVENOUS
  Filled 2017-06-14: qty 2

## 2017-06-14 MED ORDER — PREDNISONE 5 MG PO TABS
5.0000 mg | ORAL_TABLET | Freq: Every day | ORAL | Status: DC
  Administered 2017-06-15 – 2017-06-19 (×5): 5 mg via ORAL
  Filled 2017-06-14 (×5): qty 1

## 2017-06-14 MED ORDER — ACETAMINOPHEN 325 MG PO TABS
650.0000 mg | ORAL_TABLET | ORAL | Status: DC | PRN
  Administered 2017-06-15 (×2): 650 mg via ORAL
  Filled 2017-06-14 (×2): qty 2

## 2017-06-14 MED ORDER — SODIUM CHLORIDE 0.9 % IV BOLUS (SEPSIS)
500.0000 mL | Freq: Once | INTRAVENOUS | Status: AC
  Administered 2017-06-14: 500 mL via INTRAVENOUS

## 2017-06-14 MED ORDER — BUPROPION HCL ER (SR) 150 MG PO TB12
150.0000 mg | ORAL_TABLET | Freq: Two times a day (BID) | ORAL | Status: DC
  Administered 2017-06-15 – 2017-06-19 (×10): 150 mg via ORAL
  Filled 2017-06-14 (×11): qty 1

## 2017-06-14 MED ORDER — ACETAMINOPHEN 650 MG RE SUPP: 650.0000 mg | RECTAL | Status: DC | PRN

## 2017-06-14 MED ORDER — ACETAMINOPHEN 160 MG/5ML PO SOLN: 650.0000 mg | ORAL | Status: DC | PRN

## 2017-06-14 MED ORDER — PANTOPRAZOLE SODIUM 40 MG PO TBEC
40.0000 mg | DELAYED_RELEASE_TABLET | Freq: Every day | ORAL | Status: DC
  Administered 2017-06-15 – 2017-06-19 (×5): 40 mg via ORAL
  Filled 2017-06-14 (×5): qty 1

## 2017-06-14 MED ORDER — IOPAMIDOL (ISOVUE-370) INJECTION 76%
INTRAVENOUS | Status: AC
  Administered 2017-06-14: 50 mL via INTRAVENOUS
  Filled 2017-06-14: qty 50

## 2017-06-14 MED ORDER — DILTIAZEM LOAD VIA INFUSION
5.0000 mg | Freq: Once | INTRAVENOUS | Status: AC
Start: 2017-06-14 — End: 2017-06-14
  Administered 2017-06-14: 5 mg via INTRAVENOUS
  Filled 2017-06-14: qty 5

## 2017-06-14 MED ORDER — FENTANYL CITRATE (PF) 100 MCG/2ML IJ SOLN
25.0000 ug | Freq: Once | INTRAMUSCULAR | Status: AC
Start: 2017-06-14 — End: 2017-06-14
  Administered 2017-06-14: 25 ug via INTRAVENOUS
  Filled 2017-06-14: qty 2

## 2017-06-14 MED ORDER — INSULIN ASPART 100 UNIT/ML ~~LOC~~ SOLN
0.0000 [IU] | Freq: Three times a day (TID) | SUBCUTANEOUS | Status: DC
  Administered 2017-06-15: 5 [IU] via SUBCUTANEOUS
  Administered 2017-06-15 (×2): 1 [IU] via SUBCUTANEOUS
  Administered 2017-06-16 (×2): 2 [IU] via SUBCUTANEOUS
  Administered 2017-06-16: 1 [IU] via SUBCUTANEOUS
  Administered 2017-06-17: 2 [IU] via SUBCUTANEOUS
  Administered 2017-06-17: 3 [IU] via SUBCUTANEOUS
  Administered 2017-06-17 – 2017-06-18 (×3): 2 [IU] via SUBCUTANEOUS
  Administered 2017-06-18: 3 [IU] via SUBCUTANEOUS
  Administered 2017-06-19 (×2): 2 [IU] via SUBCUTANEOUS

## 2017-06-14 MED ORDER — DILTIAZEM HCL-DEXTROSE 100-5 MG/100ML-% IV SOLN (PREMIX)
5.0000 mg/h | INTRAVENOUS | Status: DC
  Administered 2017-06-14: 15 mg/h via INTRAVENOUS
  Administered 2017-06-14: 5 mg/h via INTRAVENOUS
  Administered 2017-06-15 (×3): 15 mg/h via INTRAVENOUS
  Filled 2017-06-14 (×5): qty 100

## 2017-06-14 MED ORDER — LEFLUNOMIDE 20 MG PO TABS
20.0000 mg | ORAL_TABLET | Freq: Every day | ORAL | Status: DC
  Administered 2017-06-15 – 2017-06-19 (×5): 20 mg via ORAL
  Filled 2017-06-14 (×5): qty 1

## 2017-06-14 MED ORDER — STROKE: EARLY STAGES OF RECOVERY BOOK
Freq: Once | Status: AC
  Administered 2017-06-15: 03:00:00
  Filled 2017-06-14: qty 1

## 2017-06-14 NOTE — Discharge Summary (Addendum)
[facility] Discharge Summary  Patient name: Rankin record number: [id number] Date of birth: 10-28-1945 Age: 72 y.o. Gender: female Date of Admission: 06/14/2017  Date of Discharge: 06/19/17 Admitting Physician: Leeanne Rio, MD  Primary Care Provider: Aletha Halim., PA-C Consultants: neurology  Indication for Hospitalization: Acute R posterior cerebral CVA  Discharge Diagnoses/Problem List:  Acute R posterior cerebral CVA Afib HTN T2DM HLD H/o of GI bleed RA HFpEF Iron deficiency anemia Lumbar spondylosis and DDD   Disposition: to CIR  Discharge Condition: stable  Discharge Exam: General: NAD, sitting up on side of bed  Cardiovascular:irregular rhythm. Regular rate. 2/6 murmur No LE edema Respiratory: CTA BL, normal work of breathing Gastrointestinal: soft, nontender, nondistended, normoactive BS MSK: moves 4 extremities equally, 5/5 strength in BLUE, BLLE, rhuematoid changes in hands Derm: no rashes appreciated Neuro: No dysarthria, aphasia or nystagmus.Facial droop with left hemaniopsia.  Normal sensation to light touch in upper and lower extremities bilaterally.  Psych: AOx3, appropriate affect  Brief Hospital Course:  Patient admitted with HA and blurry vision found to have posterior cerebral artery CVA, with accompanying left homonymous hemianopsia. Giant cell arteritis and glaucoma considered, but less likely sources of HA. Neurology was consulted for acute CVA. PT/OT recommended CIR given persistent visual field deficits. Etiology of stroke thought to be secondary to holding xarelto PTA for planned epidural steroid injection. Patient with afib at home, required dilt and metoprolol for rate control (RVR on admission).   Repeat echo showed: - Left ventricle: The cavity size was normal. There was moderate   concentric hypertrophy. Systolic function was normal. The   estimated ejection fraction was in the range  of 60% to 65%. Wall   motion was normal; there were no regional wall motion   abnormalities. - Mitral valve: Calcified annulus. Moderately thickened, moderately   calcified leaflets . The findings are consistent with moderate   stenosis. There was mild to moderate regurgitation. Valve area by   pressure half-time: 2.22 cm^2. Valve area by continuity equation   (using LVOT flow): 1.53 cm^2. - Right ventricle: The cavity size was mildly dilated. Wall   thickness was normal. Systolic function was mildly reduced. - Pulmonary arteries: Systolic pressure was moderately increased.   PA peak pressure: 49 mm Hg (S). - Pericardium, extracardiac: There was no pericardial effusion.  Issues for Follow Up:  1. ~4/14 followup with stroke NP at clinic 2. Multiple thyroid nodules, found on CTA head, neck, measuring up to 3 cm. Dedicated thyroid ultrasound is recommended for further characterization. 3. Should restart xarelto and stop aspirin on 3/21 per neuro 4. Patient was found on echo to have mitral regurgitation, please arrange appropriate followup with pcp 5. Patient's DM had been managed with amaryl prior, pcp to adjut treatment as needed based on A1C of 6.9  Significant Procedures:   Significant Labs and Imaging:  Recent Labs  Lab 06/17/17 0527 06/18/17 0350 06/19/17 1224  WBC 7.3 10.0 9.3  HGB 10.7* 10.9* 11.1*  HCT 36.0 36.7 37.4  PLT 249 237 255   Recent Labs  Lab 06/14/17 1555 06/14/17 1614 06/15/17 0317 06/16/17 0507 06/18/17 0350 06/19/17 1224  NA 141 142 140 139 140 142  K 3.5 3.5 3.2* 3.6 3.6 3.6  CL 107 107 107 108 110 109  CO2 23  --  21* 21* 20* 21*  GLUCOSE 123* 120* 168* 133* 176* 241*  BUN 18 20 11 8  22* 18  CREATININE 0.83 0.70 0.69  0.73 1.07* 0.79  CALCIUM 9.2  --  8.7* 9.0 9.2 8.9  ALKPHOS 131*  --   --   --   --   --   AST 15  --   --   --   --   --   ALT 14  --   --   --   --   --   ALBUMIN 3.2*  --   --   --   --   --    Ct Angio Head W Or Wo  Contrast  Result Date: 06/14/2017 CLINICAL DATA:  Headache and blurry vision EXAM: CT ANGIOGRAPHY HEAD AND NECK TECHNIQUE: Multidetector CT imaging of the head and neck was performed using the standard protocol during bolus administration of intravenous contrast. Multiplanar CT image reconstructions and MIPs were obtained to evaluate the vascular anatomy. Carotid stenosis measurements (when applicable) are obtained utilizing NASCET criteria, using the distal internal carotid diameter as the denominator. CONTRAST:  34mL ISOVUE-370 IOPAMIDOL (ISOVUE-370) INJECTION 76% COMPARISON:  Head CT 06/14/2017 FINDINGS: CTA NECK FINDINGS Aortic arch: There is mild calcific atherosclerosis of the aortic arch. There is no aneurysm, dissection or hemodynamically significant stenosis of the visualized ascending aorta and aortic arch. Normal variant aortic arch branching pattern with the brachiocephalic and left common carotid arteries sharing a common origin. The visualized proximal subclavian arteries are widely patent. Right carotid system: The right common carotid origin is widely patent. There is no common carotid or internal carotid artery dissection or aneurysm. Minimal atherosclerotic calcification at the carotid bifurcation without hemodynamically significant stenosis. Left carotid system: The left common carotid origin is widely patent. There is no common carotid or internal carotid artery dissection or aneurysm. Mild atherosclerotic calcification at the carotid bifurcation without hemodynamically significant stenosis. Vertebral arteries: The vertebral system is right dominant. Both vertebral artery origins are normal. Both vertebral arteries are normal to their confluence with the basilar artery. Skeleton: There is no bony spinal canal stenosis. No lytic or blastic lesions. Other neck: There are multiple thyroid nodules. The largest is on the right and measures 3 cm. Upper chest: No pneumothorax or pleural effusion. No  nodules or masses. Review of the MIP images confirms the above findings CTA HEAD FINDINGS Anterior circulation: --Intracranial internal carotid arteries: Calcification of both internal carotid arteries at the skull base without hemodynamically significant stenosis. --Anterior cerebral arteries: Normal. --Middle cerebral arteries: Normal. --Posterior communicating arteries: Absent bilaterally. Posterior circulation: --Posterior cerebral arteries: Normal. --Superior cerebellar arteries: Normal. --Basilar artery: Normal. --Anterior inferior cerebellar arteries: Normal. --Posterior inferior cerebellar arteries: Normal. Venous sinuses: As permitted by contrast timing, patent. Anatomic variants: None Delayed phase: No parenchymal contrast enhancement. Redemonstration of focal hypoattenuation in the right PCA distribution, now more conspicuous. No hemorrhage. No mass effect. Review of the MIP images confirms the above findings. IMPRESSION: 1. No emergent large vessel occlusion or flow-limiting stenosis. 2. Redemonstration of right PCA territory infarct with expected evolution, but no hemorrhage or mass effect. The right posterior cerebral artery is angiographically patent. 3. Mild atherosclerotic calcification at the aortic arch, carotid bifurcations and skull base segments of the internal carotid arteries without hemodynamically significant stenosis by NASCET criteria. ( Aortic Atherosclerosis (ICD10-I70.0). ) 4. **An incidental finding of potential clinical significance has been found. Multiple thyroid nodules, measuring up to 3 cm. Dedicated thyroid ultrasound is recommended for further characterization on a nonemergent outpatient basis. ** Electronically Signed   By: Ulyses Jarred M.D.   On: 06/14/2017 22:12   Ct Head Wo  Contrast  Result Date: 06/14/2017 CLINICAL DATA:  Right-sided headache began last night and has become increasingly severe. Intermittent blurred vision. EXAM: CT HEAD WITHOUT CONTRAST TECHNIQUE:  Contiguous axial images were obtained from the base of the skull through the vertex without intravenous contrast. COMPARISON:  None. FINDINGS: Brain: There is asymmetric low-attenuation in the right occipital lobe. No evidence of acute hemorrhage, mass lesion, mass effect or hydrocephalus. Atrophy. Mild periventricular low attenuation. Remote right cerebellar infarct. Vascular: No hyperdense vessel or unexpected calcification. Skull: Normal. Negative for fracture or focal lesion. Sinuses/Orbits: No acute finding. Other: None. IMPRESSION: 1. Acute right posterior cerebral artery infarct.  No hemorrhage. 2. Atrophy and chronic microvascular white matter ischemic changes. 3. Remote right cerebellar infarct. Electronically Signed   By: Lorin Picket M.D.   On: 06/14/2017 16:39   Ct Angio Neck W Or Wo Contrast  Result Date: 06/14/2017 CLINICAL DATA:  Headache and blurry vision EXAM: CT ANGIOGRAPHY HEAD AND NECK TECHNIQUE: Multidetector CT imaging of the head and neck was performed using the standard protocol during bolus administration of intravenous contrast. Multiplanar CT image reconstructions and MIPs were obtained to evaluate the vascular anatomy. Carotid stenosis measurements (when applicable) are obtained utilizing NASCET criteria, using the distal internal carotid diameter as the denominator. CONTRAST:  44mL ISOVUE-370 IOPAMIDOL (ISOVUE-370) INJECTION 76% COMPARISON:  Head CT 06/14/2017 FINDINGS: CTA NECK FINDINGS Aortic arch: There is mild calcific atherosclerosis of the aortic arch. There is no aneurysm, dissection or hemodynamically significant stenosis of the visualized ascending aorta and aortic arch. Normal variant aortic arch branching pattern with the brachiocephalic and left common carotid arteries sharing a common origin. The visualized proximal subclavian arteries are widely patent. Right carotid system: The right common carotid origin is widely patent. There is no common carotid or internal  carotid artery dissection or aneurysm. Minimal atherosclerotic calcification at the carotid bifurcation without hemodynamically significant stenosis. Left carotid system: The left common carotid origin is widely patent. There is no common carotid or internal carotid artery dissection or aneurysm. Mild atherosclerotic calcification at the carotid bifurcation without hemodynamically significant stenosis. Vertebral arteries: The vertebral system is right dominant. Both vertebral artery origins are normal. Both vertebral arteries are normal to their confluence with the basilar artery. Skeleton: There is no bony spinal canal stenosis. No lytic or blastic lesions. Other neck: There are multiple thyroid nodules. The largest is on the right and measures 3 cm. Upper chest: No pneumothorax or pleural effusion. No nodules or masses. Review of the MIP images confirms the above findings CTA HEAD FINDINGS Anterior circulation: --Intracranial internal carotid arteries: Calcification of both internal carotid arteries at the skull base without hemodynamically significant stenosis. --Anterior cerebral arteries: Normal. --Middle cerebral arteries: Normal. --Posterior communicating arteries: Absent bilaterally. Posterior circulation: --Posterior cerebral arteries: Normal. --Superior cerebellar arteries: Normal. --Basilar artery: Normal. --Anterior inferior cerebellar arteries: Normal. --Posterior inferior cerebellar arteries: Normal. Venous sinuses: As permitted by contrast timing, patent. Anatomic variants: None Delayed phase: No parenchymal contrast enhancement. Redemonstration of focal hypoattenuation in the right PCA distribution, now more conspicuous. No hemorrhage. No mass effect. Review of the MIP images confirms the above findings. IMPRESSION: 1. No emergent large vessel occlusion or flow-limiting stenosis. 2. Redemonstration of right PCA territory infarct with expected evolution, but no hemorrhage or mass effect. The right  posterior cerebral artery is angiographically patent. 3. Mild atherosclerotic calcification at the aortic arch, carotid bifurcations and skull base segments of the internal carotid arteries without hemodynamically significant stenosis by NASCET criteria. ( Aortic Atherosclerosis (  ICD10-I70.0). ) 4. **An incidental finding of potential clinical significance has been found. Multiple thyroid nodules, measuring up to 3 cm. Dedicated thyroid ultrasound is recommended for further characterization on a nonemergent outpatient basis. ** Electronically Signed   By: Ulyses Jarred M.D.   On: 06/14/2017 22:12   Mr Brain Wo Contrast  Result Date: 06/15/2017 CLINICAL DATA:  Headache and blurry vision.  Right PCA infarct. EXAM: MRI HEAD WITHOUT CONTRAST TECHNIQUE: Multiplanar, multiecho pulse sequences of the brain and surrounding structures were obtained without intravenous contrast. COMPARISON:  CTA head neck 06/14/2017 FINDINGS: Brain: The midline structures are normal. There is abnormal diffusion restriction within the right occipital lobe, right temporal lobe and posterior right insula. These areas are within the right MCA and PCA territories. No mass lesion, hydrocephalus, dural abnormality or extra-axial collection. Old right cerebellar infarct. Mild periventricular T2 hyperintensity. There is hyperintense T2-weighted signal corresponding to the areas of abnormal diffusion restriction. Gradient echo imaging demonstrates petechial hemorrhage in the right PCA infarct distribution. No space-occupying hematoma. No age-advanced or lobar predominant atrophy. Vascular: Major intracranial arterial and venous sinus flow voids are preserved. Skull and upper cervical spine: The visualized skull base, calvarium, upper cervical spine and extracranial soft tissues are normal. Sinuses/Orbits: No fluid levels or advanced mucosal thickening. No mastoid or middle ear effusion. Normal orbits. IMPRESSION: 1. Acute infarcts of the posterior  right middle cerebral artery territory and the right posterior cerebral artery territory. 2. Petechial hemorrhage in the distribution of the right PCA infarct. No intraparenchymal hematoma. No mass effect or hydrocephalus. 3. Chronic small vessel disease. Electronically Signed   By: Ulyses Jarred M.D.   On: 06/15/2017 00:17      Results/Tests Pending at Time of Discharge:   Discharge Medications:  Allergies as of 06/19/2017   No Known Allergies     Medication List    STOP taking these medications   furosemide 20 MG tablet Commonly known as:  LASIX   glimepiride 2 MG tablet Commonly known as:  AMARYL   HYDROcodone-acetaminophen 5-325 MG tablet Commonly known as:  NORCO/VICODIN   metFORMIN 500 MG 24 hr tablet Commonly known as:  GLUCOPHAGE-XR   rivaroxaban 20 MG Tabs tablet Commonly known as:  XARELTO     TAKE these medications   acetaminophen 325 MG tablet Commonly known as:  TYLENOL Take 2 tablets (650 mg total) by mouth every 4 (four) hours as needed for mild pain (or temp > 37.5 C (99.5 F)).   aspirin 325 MG tablet Take 1 tablet (325 mg total) by mouth daily. Start taking on:  06/20/2017   Baclofen 5 MG Tabs Take 5 mg by mouth 3 (three) times daily.   bethanechol 5 MG tablet Commonly known as:  URECHOLINE Take 1 tablet (5 mg total) by mouth 3 (three) times daily.   buPROPion 150 MG 12 hr tablet Commonly known as:  WELLBUTRIN SR Take 150 mg by mouth 2 (two) times daily.   diltiazem 360 MG 24 hr capsule Commonly known as:  CARDIZEM CD TAKE 1 CAPSULE (360 MG TOTAL) BY MOUTH DAILY.   esomeprazole 40 MG capsule Commonly known as:  NEXIUM Take 40 mg by mouth every morning.   ferrous gluconate 324 MG tablet Commonly known as:  FERGON Take 1 tablet (324 mg total) by mouth 2 (two) times daily with a meal.   gabapentin 300 MG capsule Commonly known as:  NEURONTIN Take 300 mg by mouth 2 (two) times daily as needed for pain.   insulin  aspart 100 UNIT/ML  injection Commonly known as:  novoLOG Inject 0-9 Units into the skin 3 (three) times daily with meals.   leflunomide 20 MG tablet Commonly known as:  ARAVA Take 20 mg by mouth daily.   metoprolol tartrate 100 MG tablet Commonly known as:  LOPRESSOR Take 1 tablet (100 mg total) by mouth 2 (two) times daily. What changed:  medication strength   nitroGLYCERIN 0.4 MG SL tablet Commonly known as:  NITROSTAT Place 1 tablet (0.4 mg total) under the tongue every 5 (five) minutes as needed for chest pain (x 3 doses).   ORENCIA CLICKJECT 492 MG/ML Soaj Generic drug:  Abatacept Inject 125 mg into the skin every Thursday.   potassium chloride 10 MEQ tablet Commonly known as:  KLOR-CON 10 Take 2 tablets (20 mEq total) 2 (two) times daily by mouth.   predniSONE 5 MG tablet Commonly known as:  DELTASONE Take 5 mg by mouth daily.   PRESERVISION AREDS 2 Caps Take 1 capsule by mouth 2 (two) times daily.   rosuvastatin 40 MG tablet Commonly known as:  CRESTOR Take 1 tablet (40 mg total) by mouth every evening. What changed:    medication strength  how much to take   senna-docusate 8.6-50 MG tablet Commonly known as:  Senokot-S Take 1 tablet by mouth at bedtime as needed for mild constipation.   VITAMIN B 12 PO Take 1,000 mcg by mouth every evening.   Vitamin D3 1000 units Caps Take 1,000 Units by mouth every evening.   VOLTAREN 1 % Gel Generic drug:  diclofenac sodium Apply 2 g topically at bedtime as needed for pain. Knees, calf            Durable Medical Equipment  (From admission, onward)        Start     Ordered   06/16/17 1437  For home use only DME wheelchair cushion (seat and back)  Once     06/16/17 1436   06/16/17 1436  For home use only DME standard manual wheelchair with seat cushion  Once    Comments:  Patient suffers from a CVA which impairs their ability to perform daily activities like bathing, dressing, feeding, grooming and toileting in the home.  A  walker will not resolve  issue with performing activities of daily living. A wheelchair will allow patient to safely perform daily activities. Patient can safely propel the wheelchair in the home or has a caregiver who can provide assistance.  Accessories: elevating leg rests (ELRs), wheel locks, extensions and anti-tippers.   06/16/17 1436      Discharge Instructions: Please refer to Patient Instructions section of EMR for full details.  Patient was counseled important signs and symptoms that should prompt return to medical care, changes in medications, dietary instructions, activity restrictions, and follow up appointments.   Follow-Up Appointments: Follow-up Information    Venancio Poisson, NP. Schedule an appointment as soon as possible for a visit in 4 week(s).   Specialty:  Nurse Practitioner Contact information: [address] [place] [address] [place], [place] Follow up.   Specialty:  Home Health Services Why:  home health services arranged Contact information: [place] [address] [place], [place], DO 06/19/2017, 3:19 PM PGY-1, [place]

## 2017-06-14 NOTE — ED Triage Notes (Signed)
[facility]- pt coming from home complaint of generalized weakness. Pt began having a right side temporal headache last night. Per family pt pt had change in mental status at 1100. Pt following commands with EMS but does report blurred vision in the sunlight.

## 2017-06-14 NOTE — ED Notes (Signed)
Patient transported to CT 

## 2017-06-14 NOTE — ED Provider Notes (Signed)
[facility] EMERGENCY DEPARTMENT Provider Note   CSN: [id number] Arrival date & time: 06/14/17  1522     History   Chief Complaint Chief Complaint  Patient presents with  . Weakness    HPI [name] is a 72 y.o. female.  She presents for evaluation of headache which started during the night while she was asleep.  She was able to go back to sleep, and awoke with a headache still there.  After she awoke she noticed that her vision was "blurry."  She was able to eat and go about her usual business but later this afternoon decided to come here because of the persistent headache.  She denies weakness, dizziness, neck pain back pain chest pain abdominal pain palpitations nausea, vomiting, change in bowel or urinary habits.  The patient was on Xarelto for atrial fibrillation, until 2 days ago when she stopped taking it because she has an upcoming epidural injection scheduled.  There are no other known modifying factors.   HPI  Past Medical History:  Diagnosis Date  . Actinic keratosis   . Anemia    hx of  . Anemia of chronic renal failure, stage 3 (moderate) ([place]) 07/17/2015  . Anticoagulant causing adverse effect in therapeutic use 07/17/2015  . Atrial fibrillation ([place])    a. persistent, she remains on Xarelto  . Carpal tunnel syndrome   . Chronic diastolic (congestive) heart failure ([name])    a. 04/2016: echo showing a preserved EF of 55-60% with mild MR. LA and RA midly dilated.   . Edema    left leg at ankle resolved now  . Gastroesophageal reflux disease   . Hepatitis 1970   not sure what kind  . Hiatal hernia   . Hyperlipidemia   . Hypertension   . Iron deficiency anemia due to chronic blood loss 07/17/2015  . Jaundice    age 89  . Peripheral vascular disease ([place]) yrs ago   DVT left lower leg questionale told by 2 drs i had no clot, 1 md said i did  . Rheumatoid arthritis(714.0)   . Type II diabetes mellitus ([place])    type2    Patient Active  Problem List   Diagnosis Date Noted  . Atrial fibrillation with RVR ([place]) 06/14/2017  . CVA (cerebral vascular accident) ([place]) 06/14/2017  . Type II diabetes mellitus ([place])   . Peripheral vascular disease ([place])   . Hyperlipidemia   . Hiatal hernia   . Edema   . Chronic diastolic (congestive) heart failure ([place])   . Carpal tunnel syndrome   . Atrial fibrillation ([place])   . Anemia   . Actinic keratosis   . Intractable nausea and vomiting 05/31/2016  . Nausea & vomiting 05/30/2016  . Abnormal chest x-ray 05/30/2016  . Viral infection   . Diabetes mellitus with complication ([place])   . Influenza A 05/21/2016  . Chronic atrial fibrillation ([place]) 05/20/2016  . Acute CHF (congestive heart failure) ([place]) 05/20/2016  . History of GI bleed 03/29/2016  . Chronic anticoagulation 03/29/2016  . Symptomatic anemia 03/19/2016  . Spinal stenosis of lumbar region 02/10/2016  . Varicose veins of left leg with edema 09/15/2015  . Preop cardiovascular exam 08/25/2015  . Iron deficiency anemia due to chronic blood loss 07/17/2015  . Anemia of chronic renal failure, stage 3 (moderate) ([place]) 07/17/2015  . Anticoagulant causing adverse effect in therapeutic use 07/17/2015  . Symptomatic bradycardia 02/21/2015  . Bradycardia   . Acute deep vein thrombosis (  DVT) of left lower extremity ([place]) 05/06/2014  . Abdominal wall mass of right lower quadrant 07/08/2013  . OA (osteoarthritis) of hip 04/03/2013  . Osteoarthritis of hip 01/18/2013  . Night sweats 09/06/2012  . Chest pain   . Anxiety and depression   . Gastroesophageal reflux disease   . Hepatitis   . Jaundice   . Hypertension 07/01/2010  . Hypercholesterolemia 07/01/2010  . DM2 (diabetes mellitus, type 2) ([place]) 07/01/2010  . Rheumatoid arthritis ([place]) 07/01/2010  . Sinus tachycardia 07/01/2010  . Chest pain, atypical 07/01/2010    Past Surgical History:  Procedure Laterality Date  . CATARACT EXTRACTION Bilateral   . COLONOSCOPY WITH  PROPOFOL N/A 02/20/2015   Procedure: COLONOSCOPY WITH PROPOFOL;  Surgeon: Carol Ada, MD;  Location: [facility];  Service: Endoscopy;  Laterality: N/A;  . ENTEROSCOPY N/A 03/23/2016   Procedure: ENTEROSCOPY;  Surgeon: Carol Ada, MD;  Location: [facility];  Service: Endoscopy;  Laterality: N/A;  . ENTEROSCOPY N/A 06/17/2016   Procedure: ENTEROSCOPY;  Surgeon: Carol Ada, MD;  Location: [facility];  Service: Endoscopy;  Laterality: N/A;  . ESOPHAGOGASTRODUODENOSCOPY (EGD) WITH PROPOFOL N/A 02/20/2015   Procedure: ESOPHAGOGASTRODUODENOSCOPY (EGD) WITH PROPOFOL;  Surgeon: Carol Ada, MD;  Location: [facility];  Service: Endoscopy;  Laterality: N/A;  . GIVENS CAPSULE STUDY N/A 03/21/2016   Procedure: GIVENS CAPSULE STUDY;  Surgeon: Carol Ada, MD;  Location: [facility];  Service: Endoscopy;  Laterality: N/A;  . HAND SURGERY  1995 and 1996   artificial joints both hands  . JOINT REPLACEMENT    . LUMBAR LAMINECTOMY/DECOMPRESSION MICRODISCECTOMY N/A 02/10/2016   Procedure: MICROLUMBAR DECOMPRESSION L4-L5 AND L3- L4, AND EXCISION OF SYNOVIAL CYST L4-L5;  Surgeon: Susa Day, MD;  Location: [facility];  Service: Orthopedics;  Laterality: N/A;  . TOTAL HIP ARTHROPLASTY Left 2009  . TOTAL HIP ARTHROPLASTY Right 04/03/2013   Procedure: RIGHT TOTAL HIP ARTHROPLASTY ANTERIOR APPROACH;  Surgeon: Gearlean Alf, MD;  Location: [facility];  Service: Orthopedics;  Laterality: Right;    OB History    No data available       Home Medications    Prior to Admission medications   Medication Sig Start Date End Date Taking? Authorizing Provider  Abatacept (ORENCIA CLICKJECT) 454 MG/ML SOAJ Inject 125 mg into the skin every Thursday.     [provider]  buPROPion (WELLBUTRIN SR) 150 MG 12 hr tablet Take 150 mg by mouth 2 (two) times daily.      [provider]  Cholecalciferol (VITAMIN D3) 1000 units CAPS Take 1,000 Units by mouth daily.    [provider]    Cyanocobalamin (VITAMIN B 12 PO) Take 1,000 mcg by mouth daily.     [provider]  diltiazem (CARDIZEM CD) 360 MG 24 hr capsule TAKE 1 CAPSULE (360 MG TOTAL) BY MOUTH DAILY. 05/02/17   Lelon Perla, MD  esomeprazole (NEXIUM) 40 MG capsule Take 40 mg by mouth every morning.     [provider]  ferrous gluconate (FERGON) 324 MG tablet Take 1 tablet (324 mg total) by mouth 2 (two) times daily with a meal. 05/17/16   Debbe Odea, MD  furosemide (LASIX) 20 MG tablet Take 2 tablets (40 mg total) by mouth daily. Take 2 tablets by mouth daily. TAKE AN ADDITIONAL 1 TAB AS NEEDED 03/27/17   Lelon Perla, MD  glimepiride (AMARYL) 2 MG tablet Take 2 mg by mouth at bedtime.    [provider]  leflunomide (ARAVA) 20 MG tablet Take  20 mg by mouth daily.    [provider]  metFORMIN (GLUCOPHAGE-XR) 500 MG 24 hr tablet Take 1,000 mg by mouth 2 (two) times daily.    [provider]  metoprolol tartrate (LOPRESSOR) 50 MG tablet TAKE 2 TABLETS (100 MG TOTAL) BY MOUTH 2 (TWO) TIMES DAILY. 09/09/16   Erlene Quan, PA-C  Multiple Vitamins-Minerals (PRESERVISION AREDS 2) CAPS Take 1 capsule by mouth 2 (two) times daily.    [provider]  nitroGLYCERIN (NITROSTAT) 0.4 MG SL tablet Place 1 tablet (0.4 mg total) under the tongue every 5 (five) minutes as needed for chest pain (x 3 doses). 11/28/16   Lelon Perla, MD  potassium chloride (KLOR-CON 10) 10 MEQ tablet Take 2 tablets (20 mEq total) 2 (two) times daily by mouth. 02/17/17   Crenshaw, Denice Bors, MD  predniSONE (DELTASONE) 5 MG tablet Take 5 mg by mouth daily.    [provider]  rivaroxaban (XARELTO) 20 MG TABS tablet Take 20 mg by mouth daily with supper.    [provider]  rosuvastatin (CRESTOR) 10 MG tablet Take 10 mg by mouth every evening.     [provider]    Family History Family History  Problem Relation Age of Onset  . Stroke Father   . Heart failure  Sister   . Hypertension Sister   . Heart attack Neg Hx     Social History Social History   Tobacco Use  . Smoking status: Never Smoker  . Smokeless tobacco: Never Used  Substance Use Topics  . Alcohol use: No    Alcohol/week: 0.0 oz  . Drug use: No     Allergies   Patient has no known allergies.   Review of Systems Review of Systems  All other systems reviewed and are negative.    Physical Exam Updated Vital Signs BP (!) 142/92   Pulse 74   Temp 98 F (36.7 C) (Oral)   Resp 20   SpO2 96%   Physical Exam  Constitutional: She is oriented to person, place, and time. She appears well-developed. No distress.  Elderly, frail  HENT:  Head: Normocephalic and atraumatic.  Eyes: Conjunctivae and EOM are normal. Pupils are equal, round, and reactive to light.  Neck: Normal range of motion and phonation normal. Neck supple.  Cardiovascular: Normal rate and regular rhythm.  Pulmonary/Chest: Effort normal and breath sounds normal. She exhibits no tenderness.  Abdominal: Soft. She exhibits no distension. There is no tenderness. There is no guarding.  Musculoskeletal: Normal range of motion.  Normal strength arms and legs bilaterally.  Neurological: She is alert and oriented to person, place, and time. She exhibits normal muscle tone.  No dysarthria, aphasia or nystagmus.  Left lateral gaze deficit more notable left eye than right.  Decreased vision left temporal, unable to finger count.  Able to independently finger count with left eye, medially.  No pronator drift.  Skin: Skin is warm and dry.  Psychiatric: She has a normal mood and affect. Her behavior is normal. Judgment and thought content normal.  Nursing note and vitals reviewed.    ED Treatments / Results  Labs (all labs ordered are listed, but only abnormal results are displayed) Labs Reviewed  CBC - Abnormal; Notable for the following components:      Result Value   WBC 10.9 (*)    Hemoglobin 11.8 (*)    MCH  23.5 (*)    MCHC 29.9 (*)    RDW 17.5 (*)  All other components within normal limits  DIFFERENTIAL - Abnormal; Notable for the following components:   Monocytes Absolute 1.3 (*)    All other components within normal limits  COMPREHENSIVE METABOLIC PANEL - Abnormal; Notable for the following components:   Glucose, Bld 123 (*)    Total Protein 6.3 (*)    Albumin 3.2 (*)    Alkaline Phosphatase 131 (*)    All other components within normal limits  I-STAT CHEM 8, ED - Abnormal; Notable for the following components:   Glucose, Bld 120 (*)    Calcium, Ion 1.01 (*)    All other components within normal limits  ETHANOL  PROTIME-INR  APTT  RAPID URINE DRUG SCREEN, HOSP PERFORMED  URINALYSIS, ROUTINE W REFLEX MICROSCOPIC  I-STAT TROPONIN, ED    EKG  EKG Interpretation  Date/Time:  Wednesday June 14 2017 16:07:33 EDT Ventricular Rate:  128 PR Interval:    QRS Duration: 75 QT Interval:  322 QTC Calculation: 470 R Axis:   58 Text Interpretation:  Atrial fibrillation Low voltage, precordial leads Borderline repol abnormality, diffuse leads Since last tracing rate faster Confirmed by Daleen Bo [phone]) on 06/14/2017 4:47:51 PM      CHA2DS2/VAS Stroke Risk Points      7 >= 2 Points: High Risk  1 - 1.99 Points: Medium Risk  0 Points: Low Risk    This is the only CHA2DS2/VAS Stroke Risk Points available for the past  year.:  Change: N/A     Details    This score determines the patient's risk of having a stroke if the  patient has atrial fibrillation.       Points Metrics  1 Has Congestive Heart Failure:  Yes   0 Has Vascular Disease:  No   1 Has Hypertension:  Yes   1 Age:  65   1 Has Diabetes:  Yes   2 Had Stroke:  No  Had TIA:  No  Had thromboembolism:  Yes   1 Female:  Yes             Radiology Ct Head Wo Contrast  Result Date: 06/14/2017 CLINICAL DATA:  Right-sided headache began last night and has become increasingly severe. Intermittent blurred vision.  EXAM: CT HEAD WITHOUT CONTRAST TECHNIQUE: Contiguous axial images were obtained from the base of the skull through the vertex without intravenous contrast. COMPARISON:  None. FINDINGS: Brain: There is asymmetric low-attenuation in the right occipital lobe. No evidence of acute hemorrhage, mass lesion, mass effect or hydrocephalus. Atrophy. Mild periventricular low attenuation. Remote right cerebellar infarct. Vascular: No hyperdense vessel or unexpected calcification. Skull: Normal. Negative for fracture or focal lesion. Sinuses/Orbits: No acute finding. Other: None. IMPRESSION: 1. Acute right posterior cerebral artery infarct.  No hemorrhage. 2. Atrophy and chronic microvascular white matter ischemic changes. 3. Remote right cerebellar infarct. Electronically Signed   By: Lorin Picket M.D.   On: 06/14/2017 16:39    Procedures .Critical Care Performed by: Daleen Bo, MD Authorized by: Daleen Bo, MD   Critical care provider statement:    Critical care time (minutes):  50   Critical care start time:  06/14/2017 3:30 PM   Critical care time was exclusive of:  Separately billable procedures and treating other patients   Critical care was necessary to treat or prevent imminent or life-threatening deterioration of the following conditions:  CNS failure or compromise   Critical care was time spent personally by me on the following activities:  Blood draw for specimens, development  of treatment plan with patient or surrogate, discussions with consultants, evaluation of patient's response to treatment, examination of patient, obtaining history from patient or surrogate, ordering and performing treatments and interventions, ordering and review of laboratory studies, pulse oximetry, re-evaluation of patient's condition, review of old charts and ordering and review of radiographic studies   (including critical care time)  Medications Ordered in ED Medications  diltiazem (CARDIZEM) 1 mg/mL load via  infusion 5 mg (5 mg Intravenous Bolus from Bag 06/14/17 1728)    And  diltiazem (CARDIZEM) 100 mg in dextrose 5% 141mL (1 mg/mL) infusion (10 mg/hr Intravenous Rate/Dose Change 06/14/17 1901)  sodium chloride 0.9 % bolus 500 mL (0 mLs Intravenous Stopped 06/14/17 1729)  fentaNYL (SUBLIMAZE) injection 25 mcg (25 mcg Intravenous Given 06/14/17 1719)  sodium chloride 0.9 % bolus 500 mL (0 mLs Intravenous Stopped 06/14/17 1846)     Initial Impression / Assessment and Plan / ED Course  I have reviewed the triage vital signs and the nursing notes.  Pertinent labs & imaging results that were available during my care of the patient were reviewed by me and considered in my medical decision making (see chart for details).  Clinical Course as of Jun 14 1901  Wed Jun 14, 2017  1650 Right posterior cerebral infarct, consistent with clinical finding of vision loss left temporal.  Infarct visible on CT, therefore subacute. CT HEAD WO CONTRAST [EW]  9326 Screening labs for evaluation of stroke syndrome, glucose mildly elevated 120, white count high at 10.9, hemoglobin low 11.8, INR normal.  [EW]  1651 Tachycardic irregular, IV fluid ordered Pulse Rate: (!) 136 [EW]  1652 Elevated BP: (!) 153/137 [EW]  1656 Reevaluation, currently patient alert and still complains of "headache right temple."  He was informed of the findings and plan.  She is agreeable.  Cardizem ordered for cardiac rate control, and fentanyl for headache.  [EW]    Clinical Course User Index [EW] Daleen Bo, MD     Patient Vitals for the past 24 hrs:  BP Temp Temp src Pulse Resp SpO2  06/14/17 1845 (!) 142/92 - - 74 20 96 %  06/14/17 1830 (!) 125/93 - - (!) 116 (!) 21 98 %  06/14/17 1815 103/80 - - (!) 138 19 95 %  06/14/17 1800 (!) 137/93 - - (!) 50 19 97 %  06/14/17 1745 (!) 121/96 - - 65 16 100 %  06/14/17 1730 (!) 127/97 - - (!) 129 17 98 %  06/14/17 1715 135/67 - - (!) 122 18 96 %  06/14/17 1700 119/79 - - 93 19 97 %  06/14/17  1652 (!) 112/92 - - (!) 143 16 98 %  06/14/17 1615 (!) 153/137 - - (!) 136 19 99 %  06/14/17 1534 111/70 98 F (36.7 C) Oral (!) 106 18 94 %  06/14/17 1526 - - - - - 95 %    6:05 PM Reevaluation with update and discussion. After initial assessment and treatment, an updated evaluation reveals clinical complaints unchanged heart rate somewhat improved.  Patient updated on findings and plan. Daleen Bo   18:00-requested callback from neuro hospitalist for consultation regarding stroke.  Dr. Cheral Marker will see the patient, 6:19 PM  6:20 PM-Consult complete with hospitalist. Patient case explained and discussed. She (FPTS) agrees to admit patient for further evaluation and treatment. Call ended at 7:03 PM   Final Clinical Impressions(s) / ED Diagnoses   Final diagnoses:  Acute CVA (cerebrovascular accident) [facility])  Atrial fibrillation with  RVR ([place])   Acute CVA, not amenable to thrombolytics, with CVA present on CT imaging.  Incidental atrial fibrillation which is recurrent with rapid ventricular rate requiring treatment with Cardizem, for stabilization.  Patient was on Xarelto until 06/12/17 but was taken off it for an upcoming orthopedic procedure.  She will require admission for further evaluation, and treatment.  Nursing Notes Reviewed/ Care Coordinated Applicable Imaging Reviewed Interpretation of Laboratory Data incorporated into ED treatment   Plan: [place]   ED Discharge Orders    None       Daleen Bo, MD 06/14/17 1904

## 2017-06-14 NOTE — H&P (Addendum)
[facility] Admission History and Physical Service Pager: [phone]  Patient name: [name] Medical record number: [id number] Date of birth: 1945-06-30 Age: 72 y.o. Gender: female  Primary Care Provider: Aletha Halim., PA-C Consultants: Neurology Code Status: Full  Chief Complaint: HA  Assessment and Plan: DENNISHA MOUSER is a 72 y.o. female presenting with headache and blurry vision found to have R posterior cerebral artery CVA. PMH is significant for HTN, DM2, HLD, chronic Afib on Xarelto, hx CVA, Hx of GI bleed, Rheumatoid arthritis and HFpEF.  Acute R posterior cerebral CVA: Acute right posterior cerebral artery infarct without hemorrhage on CT. Still reports of some blurriness. Difficult visual field exam to perform, but seems to have left homonymous hemianopsia. No other focal neurological deficits. Unclear why she has right temporal HA along with this. Considered Giant cell arteritis for her HA (especially with her RA history) but unlikely as she would have blurred vision in one eye. Also considered acute angle glaucoma with her HA but again unlikely. Neurology feels that this may be embolic source for stroke with her history of Afib and being off xarelo for 3 days.  - Admit to stepdown, attending Dr. Ardelia Mems - neuro checks per protocol - neurology consulted: discussed over the phone before formal consult. Recommend holding anticoagulation for now and re-evaluate tomorrow  - MRI Brain - CTA Head and Neck per neuro - ECHO - lipid, A1c - permissive HTN  - ASA, Crestor  Afib with RVR: Asymptomatic. H/o paroxysmal Afib on Xarelto. Patient recently stopped Xarelto on 06/12/2017 for procedure planned for tomorrow. Also on diltiazem 378m daily at home. - Will hold anticoagulation overnight per neuro recs - Patient currently on dilt gtt, remains tachycardic- titrate as needed - Consider consulting cardiology if patient does not improve - Holding  home diltiazem - ECHO   HTN: chronic. BP 111/70 on arrival to ED. Hypertensive while we were in room. Home Medications: Metoprolol 1053mBID, Lasix 2035maily, Diltiazem 360m31mily. - Allow permissive HTN - Holding home Lasix 20mg49mpressor 50mg 71me II DM: Last A1c 6.0 from 02/03/2016 - Hold home Amaryl, and consider d/c at discharge  - Monitor CBG's with sSSI - Repeat A1c  HLD: Last lipid panel in 2016 - Repeat lipid panel - continue home Crestor  H/o of GI bleed: Patient not on ASA at home, but does not appear to be related to GI bleed.  - Monitor for any signs of bleeding  Rheumatoid arthritis: Chronic. Stable. - Continue home Arava and prednisone 5 mg daily - patient is also on Orencia injection q Thursday   HFpEF: Last ECHO 04/06/2016 with EF: 55% - 60%, and calcified mitral valve annulus, as well as mildly thickened leaflets with mild mitral regurgitation. Clinically euvolemic.  - Repeat ECHO - holding Lasix for now   Iron Deficiency Anemia: on Fe supplement at home. hgb stable  - monitor   Lumbar Spondylosis and DDD: MRI Lumbar spine on 05/26/17 with with prominent impingement at L4-5; moderate to prominent impingement at L3-4; and mild impingement at L5-S1.   FEN/GI: NPO pending swallow eval Prophylaxis: SCD's  Disposition: admit to stepdown due to dilt drip  History of Present Illness:  Christina WITNEY HUIE71 y.o95female presenting with headache and blurry vision. Patient reporting that yesterday afternoon she started having a headache at the right temple region that is sharp and constant. Did not improve with aleve. Her vision started to get blurry which was  what brought her in. Reports that usually she places a warm rag on her temple and it would resolve the headache, but this did not happen. This particular HA is different from her usual sinus HA. She is normally on xarelto for afib but she stopped this on Monday for an injection in her back that was scheduled for  tomorrow. Denies focal weakness or numbness. She reports of chronic tingling in the lower extremity which she attributes to her nerve impingement from chronic lumbar DDD. Reguarding her blurred vision, she reports of having this intermittently for 2 weeks. She notices it in the morning when she walks into her kitchen and the sunlight from her window would cause some blurred vision in her right eye. She initially reported that this only occurs in the morning when she walks to her kitchen. But when asked if she has blurred vision currently, she reports that she does but not as significant now.   Patient also reports that since Feb 12th she has fallen 6 times. She did not hit her head during any of these. Last fall was Feb 26th. She was told this was due to her nerve impingements on herback. She was hoping the spinal injections will help with this as she is not fond of having surgery.    Review Of Systems: Per HPI with the following additions:   Review of Systems  Constitutional: Negative for chills and fever.  HENT: Negative for sore throat.   Eyes: Positive for blurred vision. Negative for discharge.  Respiratory: Negative for shortness of breath.   Cardiovascular: Negative for chest pain and palpitations.  Gastrointestinal: Negative for abdominal pain, diarrhea and vomiting.  Genitourinary: Negative for dysuria and frequency.  Musculoskeletal: Positive for falls.  Skin: Negative for rash.  Neurological: Positive for tingling and headaches. Negative for dizziness, sensory change, speech change and focal weakness.  Psychiatric/Behavioral: Negative for memory loss and substance abuse.    Patient Active Problem List   Diagnosis Date Noted  . Atrial fibrillation with RVR ([place]) 06/14/2017  . CVA (cerebral vascular accident) ([place]) 06/14/2017  . Type II diabetes mellitus ([place])   . Peripheral vascular disease ([place])   . Hyperlipidemia   . Hiatal hernia   . Edema   . Chronic diastolic (congestive)  heart failure ([place])   . Carpal tunnel syndrome   . Atrial fibrillation ([place])   . Anemia   . Actinic keratosis   . Intractable nausea and vomiting 05/31/2016  . Nausea & vomiting 05/30/2016  . Abnormal chest x-ray 05/30/2016  . Viral infection   . Diabetes mellitus with complication ([place])   . Influenza A 05/21/2016  . Chronic atrial fibrillation ([place]) 05/20/2016  . Acute CHF (congestive heart failure) ([place]) 05/20/2016  . History of GI bleed 03/29/2016  . Chronic anticoagulation 03/29/2016  . Symptomatic anemia 03/19/2016  . Spinal stenosis of lumbar region 02/10/2016  . Varicose veins of left leg with edema 09/15/2015  . Preop cardiovascular exam 08/25/2015  . Iron deficiency anemia due to chronic blood loss 07/17/2015  . Anemia of chronic renal failure, stage 3 (moderate) ([place]) 07/17/2015  . Anticoagulant causing adverse effect in therapeutic use 07/17/2015  . Symptomatic bradycardia 02/21/2015  . Bradycardia   . Acute deep vein thrombosis (DVT) of left lower extremity ([place]) 05/06/2014  . Abdominal wall mass of right lower quadrant 07/08/2013  . OA (osteoarthritis) of hip 04/03/2013  . Osteoarthritis of hip 01/18/2013  . Night sweats 09/06/2012  . Chest pain   . Anxiety  and depression   . Gastroesophageal reflux disease   . Hepatitis   . Jaundice   . Hypertension 07/01/2010  . Hypercholesterolemia 07/01/2010  . DM2 (diabetes mellitus, type 2) ([place]) 07/01/2010  . Rheumatoid arthritis ([place]) 07/01/2010  . Sinus tachycardia 07/01/2010  . Chest pain, atypical 07/01/2010    Past Medical History: Past Medical History:  Diagnosis Date  . Actinic keratosis   . Anemia    hx of  . Anemia of chronic renal failure, stage 3 (moderate) ([place]) 07/17/2015  . Anticoagulant causing adverse effect in therapeutic use 07/17/2015  . Atrial fibrillation ([place])    a. persistent, she remains on Xarelto  . Carpal tunnel syndrome   . Chronic diastolic (congestive) heart failure ([place])    a.  04/2016: echo showing a preserved EF of 55-60% with mild MR. LA and RA midly dilated.   . Edema    left leg at ankle resolved now  . Gastroesophageal reflux disease   . Hepatitis 1970   not sure what kind  . Hiatal hernia   . Hyperlipidemia   . Hypertension   . Iron deficiency anemia due to chronic blood loss 07/17/2015  . Jaundice    age 69  . Peripheral vascular disease ([place]) yrs ago   DVT left lower leg questionale told by 2 drs i had no clot, 1 md said i did  . Rheumatoid arthritis(714.0)   . Type II diabetes mellitus ([place])    type2    Past Surgical History: Past Surgical History:  Procedure Laterality Date  . CATARACT EXTRACTION Bilateral   . COLONOSCOPY WITH PROPOFOL N/A 02/20/2015   Procedure: COLONOSCOPY WITH PROPOFOL;  Surgeon: Carol Ada, MD;  Location: [facility];  Service: Endoscopy;  Laterality: N/A;  . ENTEROSCOPY N/A 03/23/2016   Procedure: ENTEROSCOPY;  Surgeon: Carol Ada, MD;  Location: [facility];  Service: Endoscopy;  Laterality: N/A;  . ENTEROSCOPY N/A 06/17/2016   Procedure: ENTEROSCOPY;  Surgeon: Carol Ada, MD;  Location: [facility];  Service: Endoscopy;  Laterality: N/A;  . ESOPHAGOGASTRODUODENOSCOPY (EGD) WITH PROPOFOL N/A 02/20/2015   Procedure: ESOPHAGOGASTRODUODENOSCOPY (EGD) WITH PROPOFOL;  Surgeon: Carol Ada, MD;  Location: [facility];  Service: Endoscopy;  Laterality: N/A;  . GIVENS CAPSULE STUDY N/A 03/21/2016   Procedure: GIVENS CAPSULE STUDY;  Surgeon: Carol Ada, MD;  Location: [facility];  Service: Endoscopy;  Laterality: N/A;  . HAND SURGERY  1995 and 1996   artificial joints both hands  . JOINT REPLACEMENT    . LUMBAR LAMINECTOMY/DECOMPRESSION MICRODISCECTOMY N/A 02/10/2016   Procedure: MICROLUMBAR DECOMPRESSION L4-L5 AND L3- L4, AND EXCISION OF SYNOVIAL CYST L4-L5;  Surgeon: Susa Day, MD;  Location: [facility];  Service: Orthopedics;  Laterality: N/A;  . TOTAL HIP ARTHROPLASTY Left 2009  . TOTAL HIP ARTHROPLASTY Right  04/03/2013   Procedure: RIGHT TOTAL HIP ARTHROPLASTY ANTERIOR APPROACH;  Surgeon: Gearlean Alf, MD;  Location: [facility];  Service: Orthopedics;  Laterality: Right;    Social History: Social History   Tobacco Use  . Smoking status: Never Smoker  . Smokeless tobacco: Never Used  Substance Use Topics  . Alcohol use: No    Alcohol/week: 0.0 oz  . Drug use: No   Additional social history: - no smoking, no alcohol, no drugs - lives with partner, Azrielle Springsteen  Please also refer to relevant sections of EMR.  Family History: Family History  Problem Relation Age of Onset  . Stroke Father   . Heart failure Sister   . Hypertension Sister   .  Heart attack Neg Hx    Allergies and Medications: No Known Allergies No current facility-administered medications on file prior to encounter.    Current Outpatient Medications on File Prior to Encounter  Medication Sig Dispense Refill  . Abatacept (ORENCIA CLICKJECT) 637 MG/ML SOAJ Inject 125 mg into the skin every Thursday.     [name] buPROPion (WELLBUTRIN SR) 150 MG 12 hr tablet Take 150 mg by mouth 2 (two) times daily.      . Cholecalciferol (VITAMIN D3) 1000 units CAPS Take 1,000 Units by mouth daily.    . Cyanocobalamin (VITAMIN B 12 PO) Take 1,000 mcg by mouth daily.     [name] diltiazem (CARDIZEM CD) 360 MG 24 hr capsule TAKE 1 CAPSULE (360 MG TOTAL) BY MOUTH DAILY. 90 capsule 3  . esomeprazole (NEXIUM) 40 MG capsule Take 40 mg by mouth every morning.     . ferrous gluconate (FERGON) 324 MG tablet Take 1 tablet (324 mg total) by mouth 2 (two) times daily with a meal. 60 tablet 3  . furosemide (LASIX) 20 MG tablet Take 2 tablets (40 mg total) by mouth daily. Take 2 tablets by mouth daily. TAKE AN ADDITIONAL 1 TAB AS NEEDED 90 tablet 5  . glimepiride (AMARYL) 2 MG tablet Take 2 mg by mouth at bedtime.    [name] leflunomide (ARAVA) 20 MG tablet Take 20 mg by mouth daily.    . metFORMIN (GLUCOPHAGE-XR) 500 MG 24 hr tablet Take 1,000 mg by mouth 2 (two)  times daily.    . metoprolol tartrate (LOPRESSOR) 50 MG tablet TAKE 2 TABLETS (100 MG TOTAL) BY MOUTH 2 (TWO) TIMES DAILY. 120 tablet 11  . Multiple Vitamins-Minerals (PRESERVISION AREDS 2) CAPS Take 1 capsule by mouth 2 (two) times daily.    . nitroGLYCERIN (NITROSTAT) 0.4 MG SL tablet Place 1 tablet (0.4 mg total) under the tongue every 5 (five) minutes as needed for chest pain (x 3 doses). 25 tablet 2  . potassium chloride (KLOR-CON 10) 10 MEQ tablet Take 2 tablets (20 mEq total) 2 (two) times daily by mouth. 360 tablet 3  . predniSONE (DELTASONE) 5 MG tablet Take 5 mg by mouth daily.    . rivaroxaban (XARELTO) 20 MG TABS tablet Take 20 mg by mouth daily with supper.    . rosuvastatin (CRESTOR) 10 MG tablet Take 10 mg by mouth every evening.       Objective: BP (!) 142/92   Pulse 74   Temp 98 F (36.7 C) (Oral)   Resp 20   SpO2 96%  Exam: General: NAD, pleasant Eyes: Right pupil is slightly smaller in diameter than the left, but reacts to light equally. EOMI, no conjunctival pallor or injection ENTM: Moist mucous membranes, no pharyngeal erythema or exudate Neck: Supple Cardiovascular: irregularly irregular, tachycardia. No LE edema  Respiratory: CTA BL, normal work of breathing Gastrointestinal: soft, nontender, nondistended, normoactive BS MSK: moves 4 extremities equally, 5/5 strength in BLUE, BLLE Derm: no rashes appreciated Neuro: Difficult visual field exam to perform, but seems to have left homonymous hemianopsia.  No dysarthria, aphasia or nystagmus.  Cranial nerves otherwise intact. Normal sensation to light touch in upper and lower extremities bilaterally  Psych: AOx3, appropriate affect  Labs and Imaging: CBC BMET  Recent Labs  Lab 06/14/17 1555 06/14/17 1614  WBC 10.9*  --   HGB 11.8* 12.9  HCT 39.4 38.0  PLT 293  --    Recent Labs  Lab 06/14/17 1555 06/14/17 1614  NA 141 142  K 3.5 3.5  CL 107 107  CO2 23  --   BUN 18 20  CREATININE 0.83 0.70  GLUCOSE  123* 120*  CALCIUM 9.2  --     EtOH: negative  INR: 1.09 PT 14 Alk Phos 131 istat Trop 0.00 EKG: atrial fibrillation with RVR  Shirley, [place], DO 06/14/2017, 7:19 PM PGY-1, [place] Intern pager: [phone], text pages welcome  UPPER LEVEL ADDENDUM  I have read the above note and made revisions highlighted in blue.  Smiley Houseman, MD PGY-3 [facility] Pager [phone]

## 2017-06-14 NOTE — ED Notes (Signed)
Pt to ct 

## 2017-06-14 NOTE — ED Notes (Signed)
Patient transported to MRI 

## 2017-06-15 ENCOUNTER — Other Ambulatory Visit (HOSPITAL_COMMUNITY): Payer: Medicare Other

## 2017-06-15 ENCOUNTER — Other Ambulatory Visit: Payer: Self-pay

## 2017-06-15 DIAGNOSIS — E11319 Type 2 diabetes mellitus with unspecified diabetic retinopathy without macular edema: Secondary | ICD-10-CM

## 2017-06-15 DIAGNOSIS — I4891 Unspecified atrial fibrillation: Secondary | ICD-10-CM

## 2017-06-15 DIAGNOSIS — I10 Essential (primary) hypertension: Secondary | ICD-10-CM

## 2017-06-15 DIAGNOSIS — E041 Nontoxic single thyroid nodule: Secondary | ICD-10-CM

## 2017-06-15 DIAGNOSIS — I63411 Cerebral infarction due to embolism of right middle cerebral artery: Secondary | ICD-10-CM

## 2017-06-15 DIAGNOSIS — I639 Cerebral infarction, unspecified: Secondary | ICD-10-CM

## 2017-06-15 DIAGNOSIS — E785 Hyperlipidemia, unspecified: Secondary | ICD-10-CM

## 2017-06-15 LAB — CBC
HEMATOCRIT: 36.8 % (ref 36.0–46.0)
Hemoglobin: 11.1 g/dL — ABNORMAL LOW (ref 12.0–15.0)
MCH: 23.6 pg — AB (ref 26.0–34.0)
MCHC: 30.2 g/dL (ref 30.0–36.0)
MCV: 78.3 fL (ref 78.0–100.0)
PLATELETS: 276 10*3/uL (ref 150–400)
RBC: 4.7 MIL/uL (ref 3.87–5.11)
RDW: 17.5 % — AB (ref 11.5–15.5)
WBC: 9.6 10*3/uL (ref 4.0–10.5)

## 2017-06-15 LAB — URINALYSIS, ROUTINE W REFLEX MICROSCOPIC
Bilirubin Urine: NEGATIVE
GLUCOSE, UA: 50 mg/dL — AB
HGB URINE DIPSTICK: NEGATIVE
Ketones, ur: NEGATIVE mg/dL
Leukocytes, UA: NEGATIVE
Nitrite: NEGATIVE
PH: 5 (ref 5.0–8.0)
Protein, ur: NEGATIVE mg/dL
SPECIFIC GRAVITY, URINE: 1.021 (ref 1.005–1.030)

## 2017-06-15 LAB — HEMOGLOBIN A1C
HEMOGLOBIN A1C: 6.9 % — AB (ref 4.8–5.6)
Mean Plasma Glucose: 151.33 mg/dL

## 2017-06-15 LAB — BASIC METABOLIC PANEL
Anion gap: 12 (ref 5–15)
BUN: 11 mg/dL (ref 6–20)
CO2: 21 mmol/L — AB (ref 22–32)
CREATININE: 0.69 mg/dL (ref 0.44–1.00)
Calcium: 8.7 mg/dL — ABNORMAL LOW (ref 8.9–10.3)
Chloride: 107 mmol/L (ref 101–111)
GFR calc Af Amer: >60 mL/min (ref >60)
GFR calc non Af Amer: >60 mL/min (ref >60)
GLUCOSE: 168 mg/dL — AB (ref 65–99)
POTASSIUM: 3.2 mmol/L — AB (ref 3.5–5.1)
Sodium: 140 mmol/L (ref 135–145)

## 2017-06-15 LAB — LIPID PANEL
Cholesterol: 143 mg/dL (ref 0–200)
HDL: 31 mg/dL — AB (ref >40)
LDL Cholesterol: 66 mg/dL (ref 0–99)
TRIGLYCERIDES: 229 mg/dL — AB (ref <150)
Total CHOL/HDL Ratio: 4.6 RATIO
VLDL: 46 mg/dL — ABNORMAL HIGH (ref 0–40)

## 2017-06-15 LAB — GLUCOSE, CAPILLARY
GLUCOSE-CAPILLARY: 149 mg/dL — AB (ref 65–99)
Glucose-Capillary: 132 mg/dL — ABNORMAL HIGH (ref 65–99)
Glucose-Capillary: 274 mg/dL — ABNORMAL HIGH (ref 65–99)
Glucose-Capillary: 148 mg/dL — ABNORMAL HIGH (ref 65–99)

## 2017-06-15 LAB — RAPID URINE DRUG SCREEN, HOSP PERFORMED
Amphetamines: NOT DETECTED
BENZODIAZEPINES: NOT DETECTED
Barbiturates: NOT DETECTED
COCAINE: NOT DETECTED
Opiates: POSITIVE — AB
Tetrahydrocannabinol: NOT DETECTED

## 2017-06-15 LAB — TSH: TSH: 0.67 u[IU]/mL (ref 0.350–4.500)

## 2017-06-15 MED ORDER — BACLOFEN 10 MG PO TABS
5.0000 mg | ORAL_TABLET | Freq: Three times a day (TID) | ORAL | Status: DC
  Administered 2017-06-15 – 2017-06-19 (×14): 5 mg via ORAL
  Filled 2017-06-15 (×14): qty 1

## 2017-06-15 MED ORDER — ROSUVASTATIN CALCIUM 20 MG PO TABS
40.0000 mg | ORAL_TABLET | Freq: Every evening | ORAL | Status: DC
  Administered 2017-06-15 – 2017-06-19 (×5): 40 mg via ORAL
  Filled 2017-06-15 (×5): qty 2

## 2017-06-15 MED ORDER — ROSUVASTATIN CALCIUM 20 MG PO TABS: 20.0000 mg | ORAL_TABLET | Freq: Every evening | ORAL | Status: DC

## 2017-06-15 MED ORDER — METOPROLOL TARTRATE 100 MG PO TABS
100.0000 mg | ORAL_TABLET | Freq: Two times a day (BID) | ORAL | Status: DC
  Administered 2017-06-15 – 2017-06-19 (×8): 100 mg via ORAL
  Filled 2017-06-15 (×8): qty 1

## 2017-06-15 MED ORDER — DILTIAZEM HCL ER COATED BEADS 180 MG PO CP24
360.0000 mg | ORAL_CAPSULE | Freq: Every day | ORAL | Status: DC
  Administered 2017-06-15 – 2017-06-19 (×5): 360 mg via ORAL
  Filled 2017-06-15 (×5): qty 2

## 2017-06-15 MED ORDER — ASPIRIN 325 MG PO TABS
325.0000 mg | ORAL_TABLET | Freq: Every day | ORAL | Status: DC
  Administered 2017-06-15 – 2017-06-19 (×5): 325 mg via ORAL
  Filled 2017-06-15 (×5): qty 1

## 2017-06-15 MED ORDER — OXYCODONE HCL 5 MG PO TABS
5.0000 mg | ORAL_TABLET | Freq: Four times a day (QID) | ORAL | Status: DC | PRN
  Administered 2017-06-15 (×2): 5 mg via ORAL
  Filled 2017-06-15 (×2): qty 1

## 2017-06-15 MED ORDER — POTASSIUM CHLORIDE CRYS ER 20 MEQ PO TBCR
40.0000 meq | EXTENDED_RELEASE_TABLET | Freq: Once | ORAL | Status: AC
  Administered 2017-06-15: 40 meq via ORAL
  Filled 2017-06-15: qty 2

## 2017-06-15 MED ORDER — SODIUM CHLORIDE 0.9 % IV BOLUS (SEPSIS)
500.0000 mL | Freq: Once | INTRAVENOUS | Status: AC
  Administered 2017-06-15: 500 mL via INTRAVENOUS

## 2017-06-15 NOTE — Progress Notes (Signed)
FPTS Interim Progress Note  Spoke to Dr. Marlou Porch, cardiologist on call, over the phone. Dr. Marlou Porch recommended stopping diltiazem drip and starting patient's home diltiazem (360 mg daily) and home metoprolol (100mg  bid) back. I discussed the concern for permissive hypertension and per Dr. Marlou Porch it would be ok to re-start metoprolol as her main concern is rapid afib and rebound tachycardia from stopping metoprolol.  -have discontinued diltiazem gtt -started diltiazem 360mg  daily -metoprolol 100mg  bid  -cardiology consulted, appreciate recommendations  Caroline More, DO 06/15/2017, 7:44 PM PGY-1, [place] [facility] Service pager [phone]

## 2017-06-15 NOTE — Progress Notes (Signed)
Pt arrived on unit in no acute distress.  She is alert and oriented. Welcome and orientation to care unit completed.  Assessment, vital signs and telemetry placement completed.  In bed resting.  Will continue to monitor.

## 2017-06-15 NOTE — Evaluation (Signed)
Occupational Therapy Evaluation Patient Details Name: [name] MRN: [id number] DOB: 1945-06-22 Today's Date: 06/15/2017    History of Present Illness 72 y.o. female past medical history ofHTN, DM2, HLD, chronic Afib on Xarelto, hx CVA presents to [facility] emergency developing headache yesterday afternoon and noticing difficulty with vision. The exact time of her last known normal is undetermined. CT head reveals a right PCA  Stroke. Patient on Xarelto for atrial fibrillation, however stopped it on Monday as she was scheduled to get a  nerve block for chronic back pain. MRI revealed Acute infarcts of the posterior right middle cerebral artery territory and the right posterior cerebral artery territory.Petechial hemorrhage in the distribution of the right PCA infarct.   Clinical Impression   Patient presenting with decreased I in self care, functional mobility, safety awareness,strength, and balance. Patient reports being mod I PTA. Pt having frequent falls over the last 3 months and reports fear of falling as well. Pt seen from bed level during evaluation per RN request secondary to increased HR of 100-130 while therapist present in room. Pt with no c/o pain at this time. Patient will benefit from acute OT to increase overall independence in the areas of ADLs, functional mobility,and safety awareness in order to safely discharge.   Follow Up Recommendations  Supervision/Assistance - 24 hour;Home health OT    Equipment Recommendations  Other (comment);Tub/shower bench    Recommendations for Other Services       Precautions / Restrictions Precautions Precautions: Fall;Other (comment) Precaution Comments: watch HR Restrictions Weight Bearing Restrictions: No      Mobility Bed Mobility Overal bed mobility: Needs Assistance Bed Mobility: Rolling;Supine to Sit;Sit to Supine Rolling: Supervision   Supine to sit: Supervision Sit to supine: Supervision   General bed mobility  comments: supervision with min verbal cues for safe technique and pt utilized bed rails  Transfers    General transfer comment: not performed secondary to increased HR. Bed level eval only    Balance Overall balance assessment: Needs assistance Sitting-balance support: Feet supported;No upper extremity supported Sitting balance-Leahy Scale: Fair Sitting balance - Comments: seated on EOB throughout session with close supervision        ADL either performed or assessed with clinical judgement   ADL Overall ADL's : Needs assistance/impaired     Grooming: Wash/dry face;Oral care;Supervision/safety;Set up;Sitting         Vision Baseline Vision/History: Wears glasses Wears Glasses: At all times Patient Visual Report: No change from baseline              Pertinent Vitals/Pain Pain Assessment: No/denies pain     Hand Dominance Right   Extremity/Trunk Assessment Upper Extremity Assessment Upper Extremity Assessment: Generalized weakness   Lower Extremity Assessment Lower Extremity Assessment: Generalized weakness       Communication Communication Communication: No difficulties   Cognition Arousal/Alertness: Awake/alert Behavior During Therapy: WFL for tasks assessed/performed Overall Cognitive Status: Within Functional Limits for tasks assessed                   Home Living Family/patient expects to be discharged to:: Private residence Living Arrangements: Spouse/significant other Available Help at Discharge: Family Type of Home: House Home Access: Stairs to enter Technical brewer of Steps: 3 Entrance Stairs-Rails: Right;Left;Can reach both Home Layout: One level     Bathroom Shower/Tub: Teacher, early years/pre: Handicapped height Bathroom Accessibility: Yes How Accessible: Accessible via walker Home Equipment: [place] - 4 wheels;Cane - single point  Additional Comments: uses rollator the most at home      Prior  Functioning/Environment Level of Independence: Independent with assistive device(s)             OT Problem List: Decreased strength;Pain;Impaired balance (sitting and/or standing);Decreased range of motion;Decreased safety awareness;Decreased activity tolerance;Decreased coordination;Decreased knowledge of use of DME or AE      OT Treatment/Interventions: Self-care/ADL training;Manual therapy;Therapeutic exercise;Patient/family education;Neuromuscular education;Splinting;Energy conservation;Therapeutic activities;DME and/or AE instruction    OT Goals(Current goals can be found in the care plan section) Acute Rehab OT Goals Patient Stated Goal: to get better and go home OT Goal Formulation: With patient Time For Goal Achievement: 06/29/17 Potential to Achieve Goals: Fair ADL Goals Pt Will Perform Upper Body Bathing: with supervision Pt Will Perform Lower Body Bathing: with supervision Pt Will Perform Upper Body Dressing: with supervision Pt Will Perform Lower Body Dressing: with supervision Pt Will Transfer to Toilet: with supervision Pt Will Perform Toileting - Clothing Manipulation and hygiene: with supervision Pt Will Perform Tub/Shower Transfer: with supervision  OT Frequency: Min 2X/week   Barriers to D/C: Other (comment)  none known at this time          AM-PAC PT "6 Clicks" Daily Activity     Outcome Measure Help from another person eating meals?: None Help from another person taking care of personal grooming?: A Little Help from another person toileting, which includes using toliet, bedpan, or urinal?: A Little Help from another person bathing (including washing, rinsing, drying)?: A Little Help from another person to put on and taking off regular upper body clothing?: A Little Help from another person to put on and taking off regular lower body clothing?: A Little 6 Click Score: 19   End of Session Nurse Communication: Precautions  Activity Tolerance: Patient  tolerated treatment well Patient left: in bed;with call bell/phone within reach  OT Visit Diagnosis: Unsteadiness on feet (R26.81);Repeated falls (R29.6);Muscle weakness (generalized) (M62.81)                Time: 6270-3500 OT Time Calculation (min): 24 min Charges:  OT General Charges $OT Visit: 1 Visit OT Evaluation $OT Eval Low Complexity: 1 Low OT Treatments $Therapeutic Activity: 8-22 mins    Theona Muhs P, MS, OTR/L, CBIS 06/15/2017, 3:05 PM

## 2017-06-15 NOTE — Consult Note (Signed)
Requesting Physician: Dr. Ardelia Mems    Chief Complaint: headache, vision loss  History obtained from: Patient and Chart    HPI:                                                                                                                                       [name] is an 72 y.o. female past medical history ofHTN, DM2, HLD, chronic Afib on Xarelto, hx CVA presents to [facility] emergency developing headache yesterday afternoon and noticing difficulty with vision. The exact time of her last known normal is undetermined. CT head reveals a right PCA  Stroke. Patient on Xarelto for atrial fibrillation, however stopped it on Monday as she was scheduled to get a  nerve block for chronic back pain.   Date last known well: 3.13.19 tPA Given: outside window NIHSS: 2 Baseline MRS 0    Past Medical History:  Diagnosis Date  . Actinic keratosis   . Anemia    hx of  . Anemia of chronic renal failure, stage 3 (moderate) ([place]) 07/17/2015  . Anticoagulant causing adverse effect in therapeutic use 07/17/2015  . Atrial fibrillation ([place])    a. persistent, she remains on Xarelto  . Carpal tunnel syndrome   . Chronic diastolic (congestive) heart failure ([place])    a. 04/2016: echo showing a preserved EF of 55-60% with mild MR. LA and RA midly dilated.   . Edema    left leg at ankle resolved now  . Gastroesophageal reflux disease   . Hepatitis 1970   not sure what kind  . Hiatal hernia   . Hyperlipidemia   . Hypertension   . Iron deficiency anemia due to chronic blood loss 07/17/2015  . Jaundice    age 20  . Peripheral vascular disease ([place]) yrs ago   DVT left lower leg questionale told by 2 drs i had no clot, 1 md said i did  . Rheumatoid arthritis(714.0)   . Type II diabetes mellitus ([place])    type2    Past Surgical History:  Procedure Laterality Date  . CATARACT EXTRACTION Bilateral   . COLONOSCOPY WITH PROPOFOL N/A 02/20/2015   Procedure: COLONOSCOPY WITH PROPOFOL;  Surgeon:  Carol Ada, MD;  Location: [facility];  Service: Endoscopy;  Laterality: N/A;  . ENTEROSCOPY N/A 03/23/2016   Procedure: ENTEROSCOPY;  Surgeon: Carol Ada, MD;  Location: [facility];  Service: Endoscopy;  Laterality: N/A;  . ENTEROSCOPY N/A 06/17/2016   Procedure: ENTEROSCOPY;  Surgeon: Carol Ada, MD;  Location: [facility];  Service: Endoscopy;  Laterality: N/A;  . ESOPHAGOGASTRODUODENOSCOPY (EGD) WITH PROPOFOL N/A 02/20/2015   Procedure: ESOPHAGOGASTRODUODENOSCOPY (EGD) WITH PROPOFOL;  Surgeon: Carol Ada, MD;  Location: [facility];  Service: Endoscopy;  Laterality: N/A;  . GIVENS CAPSULE STUDY N/A 03/21/2016   Procedure: GIVENS CAPSULE STUDY;  Surgeon: Carol Ada, MD;  Location: [facility];  Service: Endoscopy;  Laterality: N/A;  .  HAND SURGERY  1995 and 1996   artificial joints both hands  . JOINT REPLACEMENT    . LUMBAR LAMINECTOMY/DECOMPRESSION MICRODISCECTOMY N/A 02/10/2016   Procedure: MICROLUMBAR DECOMPRESSION L4-L5 AND L3- L4, AND EXCISION OF SYNOVIAL CYST L4-L5;  Surgeon: Susa Day, MD;  Location: [facility];  Service: Orthopedics;  Laterality: N/A;  . TOTAL HIP ARTHROPLASTY Left 2009  . TOTAL HIP ARTHROPLASTY Right 04/03/2013   Procedure: RIGHT TOTAL HIP ARTHROPLASTY ANTERIOR APPROACH;  Surgeon: Gearlean Alf, MD;  Location: [facility];  Service: Orthopedics;  Laterality: Right;    Family History  Problem Relation Age of Onset  . Stroke Father   . Heart failure Sister   . Hypertension Sister   . Heart attack Neg Hx    Social History:  reports that  has never smoked. she has never used smokeless tobacco. She reports that she does not drink alcohol or use drugs.  Allergies: No Known Allergies  Medications:                                                                                                                        I reviewed home medications   ROS:                                                                                                                                      14 systems reviewed and negative except above    Examination:                                                                                                      General: Appears well-developed and well-nourished.  Psych: Affect appropriate to situation Eyes: No scleral injection HENT: No OP obstrucion Head: Normocephalic.  Cardiovascular: Normal rate and regular rhythm.  Respiratory: Effort normal and breath sounds normal to anterior ascultation GI: Soft.  No distension. There is no tenderness.  Skin: WDI   Neurological Examination Mental Status: Alert, oriented, thought content appropriate.  Speech fluent without  evidence of aphasia. Able to follow 3 step commands without difficulty. Cranial Nerves: II: Visual fields : left-sided homonymous hemianopsia  III,IV, VI: ptosis not present, extra-ocular motions intact bilaterally, pupils equal, round, reactive to light and accommodation V,VII: smile symmetric, facial light touch sensation normal bilaterally VIII: hearing normal bilaterally IX,X: uvula rises symmetrically XI: bilateral shoulder shrug XII: midline tongue extension Motor: Right : Upper extremity   5/5    Left:     Upper extremity   5/5  Lower extremity   5/5     Lower extremity   5/5 Tone and bulk:normal tone throughout; no atrophy noted Sensory: Pinprick and light touch intact throughout, bilaterally Deep Tendon Reflexes: 2+ and symmetric throughout Plantars: Right: downgoing   Left: downgoing Cerebellar: normal finger-to-nose, normal rapid alternating movements and normal heel-to-shin test Gait: normal gait and station     Lab Results: Basic Metabolic Panel: Recent Labs  Lab 06/14/17 1555 06/14/17 1614  NA 141 142  K 3.5 3.5  CL 107 107  CO2 23  --   GLUCOSE 123* 120*  BUN 18 20  CREATININE 0.83 0.70  CALCIUM 9.2  --     CBC: Recent Labs  Lab 06/14/17 1555 06/14/17 1614  WBC 10.9*  --   NEUTROABS 7.7  --    HGB 11.8* 12.9  HCT 39.4 38.0  MCV 78.5  --   PLT 293  --     Coagulation Studies: Recent Labs    06/14/17 1555  LABPROT 14.0  INR 1.09    Imaging: Ct Angio Head W Or Wo Contrast  Result Date: 06/14/2017 CLINICAL DATA:  Headache and blurry vision EXAM: CT ANGIOGRAPHY HEAD AND NECK TECHNIQUE: Multidetector CT imaging of the head and neck was performed using the standard protocol during bolus administration of intravenous contrast. Multiplanar CT image reconstructions and MIPs were obtained to evaluate the vascular anatomy. Carotid stenosis measurements (when applicable) are obtained utilizing NASCET criteria, using the distal internal carotid diameter as the denominator. CONTRAST:  78mL ISOVUE-370 IOPAMIDOL (ISOVUE-370) INJECTION 76% COMPARISON:  Head CT 06/14/2017 FINDINGS: CTA NECK FINDINGS Aortic arch: There is mild calcific atherosclerosis of the aortic arch. There is no aneurysm, dissection or hemodynamically significant stenosis of the visualized ascending aorta and aortic arch. Normal variant aortic arch branching pattern with the brachiocephalic and left common carotid arteries sharing a common origin. The visualized proximal subclavian arteries are widely patent. Right carotid system: The right common carotid origin is widely patent. There is no common carotid or internal carotid artery dissection or aneurysm. Minimal atherosclerotic calcification at the carotid bifurcation without hemodynamically significant stenosis. Left carotid system: The left common carotid origin is widely patent. There is no common carotid or internal carotid artery dissection or aneurysm. Mild atherosclerotic calcification at the carotid bifurcation without hemodynamically significant stenosis. Vertebral arteries: The vertebral system is right dominant. Both vertebral artery origins are normal. Both vertebral arteries are normal to their confluence with the basilar artery. Skeleton: There is no bony spinal canal  stenosis. No lytic or blastic lesions. Other neck: There are multiple thyroid nodules. The largest is on the right and measures 3 cm. Upper chest: No pneumothorax or pleural effusion. No nodules or masses. Review of the MIP images confirms the above findings CTA HEAD FINDINGS Anterior circulation: --Intracranial internal carotid arteries: Calcification of both internal carotid arteries at the skull base without hemodynamically significant stenosis. --Anterior cerebral arteries: Normal. --Middle cerebral arteries: Normal. --Posterior communicating arteries: Absent bilaterally. Posterior circulation: --Posterior cerebral arteries: Normal. --  Superior cerebellar arteries: Normal. --Basilar artery: Normal. --Anterior inferior cerebellar arteries: Normal. --Posterior inferior cerebellar arteries: Normal. Venous sinuses: As permitted by contrast timing, patent. Anatomic variants: None Delayed phase: No parenchymal contrast enhancement. Redemonstration of focal hypoattenuation in the right PCA distribution, now more conspicuous. No hemorrhage. No mass effect. Review of the MIP images confirms the above findings. IMPRESSION: 1. No emergent large vessel occlusion or flow-limiting stenosis. 2. Redemonstration of right PCA territory infarct with expected evolution, but no hemorrhage or mass effect. The right posterior cerebral artery is angiographically patent. 3. Mild atherosclerotic calcification at the aortic arch, carotid bifurcations and skull base segments of the internal carotid arteries without hemodynamically significant stenosis by NASCET criteria. ( Aortic Atherosclerosis (ICD10-I70.0). ) 4. **An incidental finding of potential clinical significance has been found. Multiple thyroid nodules, measuring up to 3 cm. Dedicated thyroid ultrasound is recommended for further characterization on a nonemergent outpatient basis. ** Electronically Signed   By: Ulyses Jarred M.D.   On: 06/14/2017 22:12   Ct Head Wo  Contrast  Result Date: 06/14/2017 CLINICAL DATA:  Right-sided headache began last night and has become increasingly severe. Intermittent blurred vision. EXAM: CT HEAD WITHOUT CONTRAST TECHNIQUE: Contiguous axial images were obtained from the base of the skull through the vertex without intravenous contrast. COMPARISON:  None. FINDINGS: Brain: There is asymmetric low-attenuation in the right occipital lobe. No evidence of acute hemorrhage, mass lesion, mass effect or hydrocephalus. Atrophy. Mild periventricular low attenuation. Remote right cerebellar infarct. Vascular: No hyperdense vessel or unexpected calcification. Skull: Normal. Negative for fracture or focal lesion. Sinuses/Orbits: No acute finding. Other: None. IMPRESSION: 1. Acute right posterior cerebral artery infarct.  No hemorrhage. 2. Atrophy and chronic microvascular white matter ischemic changes. 3. Remote right cerebellar infarct. Electronically Signed   By: Lorin Picket M.D.   On: 06/14/2017 16:39   Ct Angio Neck W Or Wo Contrast  Result Date: 06/14/2017 CLINICAL DATA:  Headache and blurry vision EXAM: CT ANGIOGRAPHY HEAD AND NECK TECHNIQUE: Multidetector CT imaging of the head and neck was performed using the standard protocol during bolus administration of intravenous contrast. Multiplanar CT image reconstructions and MIPs were obtained to evaluate the vascular anatomy. Carotid stenosis measurements (when applicable) are obtained utilizing NASCET criteria, using the distal internal carotid diameter as the denominator. CONTRAST:  52mL ISOVUE-370 IOPAMIDOL (ISOVUE-370) INJECTION 76% COMPARISON:  Head CT 06/14/2017 FINDINGS: CTA NECK FINDINGS Aortic arch: There is mild calcific atherosclerosis of the aortic arch. There is no aneurysm, dissection or hemodynamically significant stenosis of the visualized ascending aorta and aortic arch. Normal variant aortic arch branching pattern with the brachiocephalic and left common carotid arteries  sharing a common origin. The visualized proximal subclavian arteries are widely patent. Right carotid system: The right common carotid origin is widely patent. There is no common carotid or internal carotid artery dissection or aneurysm. Minimal atherosclerotic calcification at the carotid bifurcation without hemodynamically significant stenosis. Left carotid system: The left common carotid origin is widely patent. There is no common carotid or internal carotid artery dissection or aneurysm. Mild atherosclerotic calcification at the carotid bifurcation without hemodynamically significant stenosis. Vertebral arteries: The vertebral system is right dominant. Both vertebral artery origins are normal. Both vertebral arteries are normal to their confluence with the basilar artery. Skeleton: There is no bony spinal canal stenosis. No lytic or blastic lesions. Other neck: There are multiple thyroid nodules. The largest is on the right and measures 3 cm. Upper chest: No pneumothorax or pleural effusion. No nodules  or masses. Review of the MIP images confirms the above findings CTA HEAD FINDINGS Anterior circulation: --Intracranial internal carotid arteries: Calcification of both internal carotid arteries at the skull base without hemodynamically significant stenosis. --Anterior cerebral arteries: Normal. --Middle cerebral arteries: Normal. --Posterior communicating arteries: Absent bilaterally. Posterior circulation: --Posterior cerebral arteries: Normal. --Superior cerebellar arteries: Normal. --Basilar artery: Normal. --Anterior inferior cerebellar arteries: Normal. --Posterior inferior cerebellar arteries: Normal. Venous sinuses: As permitted by contrast timing, patent. Anatomic variants: None Delayed phase: No parenchymal contrast enhancement. Redemonstration of focal hypoattenuation in the right PCA distribution, now more conspicuous. No hemorrhage. No mass effect. Review of the MIP images confirms the above findings.  IMPRESSION: 1. No emergent large vessel occlusion or flow-limiting stenosis. 2. Redemonstration of right PCA territory infarct with expected evolution, but no hemorrhage or mass effect. The right posterior cerebral artery is angiographically patent. 3. Mild atherosclerotic calcification at the aortic arch, carotid bifurcations and skull base segments of the internal carotid arteries without hemodynamically significant stenosis by NASCET criteria. ( Aortic Atherosclerosis (ICD10-I70.0). ) 4. **An incidental finding of potential clinical significance has been found. Multiple thyroid nodules, measuring up to 3 cm. Dedicated thyroid ultrasound is recommended for further characterization on a nonemergent outpatient basis. ** Electronically Signed   By: Ulyses Jarred M.D.   On: 06/14/2017 22:12   Mr Brain Wo Contrast  Result Date: 06/15/2017 CLINICAL DATA:  Headache and blurry vision.  Right PCA infarct. EXAM: MRI HEAD WITHOUT CONTRAST TECHNIQUE: Multiplanar, multiecho pulse sequences of the brain and surrounding structures were obtained without intravenous contrast. COMPARISON:  CTA head neck 06/14/2017 FINDINGS: Brain: The midline structures are normal. There is abnormal diffusion restriction within the right occipital lobe, right temporal lobe and posterior right insula. These areas are within the right MCA and PCA territories. No mass lesion, hydrocephalus, dural abnormality or extra-axial collection. Old right cerebellar infarct. Mild periventricular T2 hyperintensity. There is hyperintense T2-weighted signal corresponding to the areas of abnormal diffusion restriction. Gradient echo imaging demonstrates petechial hemorrhage in the right PCA infarct distribution. No space-occupying hematoma. No age-advanced or lobar predominant atrophy. Vascular: Major intracranial arterial and venous sinus flow voids are preserved. Skull and upper cervical spine: The visualized skull base, calvarium, upper cervical spine and  extracranial soft tissues are normal. Sinuses/Orbits: No fluid levels or advanced mucosal thickening. No mastoid or middle ear effusion. Normal orbits. IMPRESSION: 1. Acute infarcts of the posterior right middle cerebral artery territory and the right posterior cerebral artery territory. 2. Petechial hemorrhage in the distribution of the right PCA infarct. No intraparenchymal hematoma. No mass effect or hydrocephalus. 3. Chronic small vessel disease. Electronically Signed   By: Ulyses Jarred M.D.   On: 06/15/2017 00:17     ASSESSMENT AND PLAN  72 y.o. female past medical history ofHTN, DM2, HLD, chronic Afib on Xarelto held on Monday for procedure, hx CVA presents to [facility] emergency developing headache yesterday afternoon and noticing difficulty with vision. CT head showed acute R PCA infarction, moderate size.   Right  PCA Acute Ischemic Stroke  Risk factors: HTN, DM2, HLD, chronic Afib on Xarelto, hx CVA,  Etiology: cardioembolic   Recommend # MRI of the brain without contrast #CTA head and neck #Transthoracic Echo  # Start patient on ASA 325mg  daily, resume Xarelto in 1- 2 days  #Start or continue Atorvastatin 80 mg/other high intensity statin # BP goal: permissive HTN upto 376/283 systolic # HBAIC and Lipid profile # Telemetry monitoring # Frequent neuro checks #  stroke  swallow screen  Please page stroke NP  Or  PA  Or MD from 8am -4 pm  as this patient from this time will be  followed by the stroke.   You can look them up on [email or web address]  Password [facility]     Sushanth Aroor [facility] Pager Number [phone]

## 2017-06-15 NOTE — Progress Notes (Signed)
CCMD reported heart rate in 140's, patient only reports headache now at 7/10, otherwise asymptomatic, family medicine text paged and attempted to reach out to cariology listed on care team. Family medicine gave orders.

## 2017-06-15 NOTE — Progress Notes (Signed)
FPTS Interim Progress Note  Paged by RN that bladder scan showed 957mls in bladder.  -plan to in-and-out cath to relieve urinary retention  -continue to monitor Is and Os  Caroline More, DO 06/15/2017, 6:49 PM PGY-1, [place] pager [phone]

## 2017-06-15 NOTE — ED Notes (Signed)
Pt returned from MRI °

## 2017-06-15 NOTE — Progress Notes (Signed)
PT Cancellation Note  Patient Details Name: [name] MRN: [id number] DOB: 12-01-1945   Cancelled Treatment:    Reason Eval/Treat Not Completed: (P) Medical issues which prohibited therapy RN requested PT come back as pt has 10/10 headache. PT will follow back this afternoon.  Feliz Lincoln B. Migdalia Dk PT, DPT Acute Rehabilitation  [phone] Pager [phone]  [place] 06/15/2017, 8:59 AM

## 2017-06-15 NOTE — Progress Notes (Signed)
FPTS Interim Progress Note  S: Received page that patient was tachycardic and hypotensive. HR while I was in room was 104. BP with systolic of 564. Patient reports HA improved after eating lunch and having something to drink. Denies CP.   O: BP 113/71   Pulse 88   Temp 98.3 F (36.8 C) (Oral)   Resp (!) 21   Ht 5\' 7"  (1.702 m)   Wt 171 lb 1.2 oz (77.6 kg)   SpO2 95%   BMI 26.79 kg/m   Gen: awake and alert, sitting up in bed eating lunch HEENT: tongue looks slightly dry Cards: regular rhythm, tachycardic  Resp: CTAB Ext: no edema, non tender  A/P: Tachycardia On dilt drip. Possibly 2/2 dehydration -will give 500cc fluid bolus -if no improvement will consult cardiology   Caroline More, DO 06/15/2017, 12:18 PM PGY-1, [place] [facility] pager [phone]

## 2017-06-15 NOTE — Progress Notes (Signed)
  Intake/Output Summary (Last 24 hours) at 06/15/2017 1844 Last data filed at 06/15/2017 1536 Gross per 24 hour  Intake 222.25 ml  Output -  Net 222.25 ml  2 occurences of urine in bed linen, bladder scan showed 97mls. Heart rate still getting up to 140's.  Family medicine provider notified, cardiology to see patient tonight, family medicine holding for cardiology. Order for in and out. Reginia RN inserted and Animator observed. 1450 mls out.

## 2017-06-15 NOTE — Progress Notes (Addendum)
Family Medicine Teaching Service Daily Progress Note Intern Pager: [phone]  Patient name: [name] Medical record number: [id number] Date of birth: 01/20/1946 Age: 72 y.o. Gender: female  Primary Care Provider: Aletha Halim., PA-C Consultants: neuro/SLP/PT/OT Code Status: full  Pt Overview and Major Events to Date:  [name] is a 72 y.o. female presenting with headache and blurry vision found to have R posterior cerebral artery CVA. PMH is significant for HTN, DM2, HLD, chronic Afib on Xarelto, hx CVA, Hx of GI bleed, Rheumatoid arthritis and HFpEF.  Assessment and Plan: [name] is a 72 y.o. female presenting with headache and blurry vision found to have R posterior cerebral artery CVA. PMH is significant for HTN, DM2, HLD, chronic Afib on Xarelto, hx CVA, Hx of GI bleed, Rheumatoid arthritis and HFpEF.  Acute R posterior CVA: Acute right posterior cerebral artery infarct without hemorrhage on CT.  left homonymous hemianopsia.  Neurology feels that this may be embolic source for stroke with her history of Afib and being off xarelo for 3 days.  MRI brain w/ acute infarts of R middle PCA and R posterior PCA, also w/ petechial hemmorrhage in distribution of R PCA infarct.  A1C 6.9.  hypertriglyceridemia - Admit to stepdown, attending Dr. Andria Frames - neuro checks per protocol - neurology consulted: discussed over the phone before formal consult. Recommend holding anticoagulation for now and re-evaluate tomorrow  - ECHO - permissive HTN to 180 -aspirin 325 -hold xarelto until cleared per neuro for restart -increase risuvastatin to 20mg  -oxy 5 q6prn for pain (headache and chronic back)  Afib with RVR: 110s on rounds, that is chronic for her at home.  Asymptomatic. H/o paroxysmal Afib on Xarelto. Patient recently stopped Xarelto on 06/12/2017 for procedure planned for tomorrow. Also on diltiazem 360mg  daily at home. - Will hold anticoagulation overnight per neuro  recs -restart xarelto when indicated by neuro - Patient currently on dilt gtt, remains tachycardic- titrate as needed - Consider consulting cardiology if patient does not improve - Holding home PO diltiazem - ECHO pending   HTN: chronic. BP 111/70 on arrival to ED. Hypertensive while we were in room. Home Medications: Metoprolol 100mg  BID, Lasix 20mg  daily, Diltiazem 360mg  daily. - Allow permissive HTN - Holding home Lasix 20mg , Lopressor 50mg   Type II DM: A1c 6.9  - Hold home Amaryl, and consider d/c at discharge  - Monitor CBG's with sSSI  Incidental Thyroid findings on CTA head: multiple nodules up to 3cm. -Thyroid US -TSH  Hypokalemia- 3.2 -kdur 40  Hypertriglyceridemia: 229 -can be followed outpatient -risuvastatin increased per neuro for stroke  HLD: Last lipid panel in 2016. Chol 229 - increase  home Crestor to 20mg   H/o of GI bleed: Patient not on ASA at home, but does not appear to be related to GI bleed.  - Monitor for any signs of bleeding with daily cbc  Rheumatoid arthritis: Chronic. Stable. - Continue home Arava and prednisone 5 mg daily - patient is also on Orencia injection q Thursday, may just have her take when she is d/c'd  HFpEF: Last ECHO 04/06/2016 with EF: 55% - 60%, and calcified mitral valve annulus, as well as mildly thickened leaflets with mild mitral regurgitation. Clinically euvolemic.  - Repeat ECHO - holding Lasix for now for permissive HTN  Iron Deficiency Anemia: on Fe supplement at home. hgb stable  - monitor   Lumbar Spondylosis and DDD: MRI Lumbar spine on 05/26/17 with with prominent impingement at L4-5; moderate to  prominent impingement at L3-4; and mild impingement at L5-S1.  Was scheduled for spinal epideral steroid injection today -Dr. Nelva Bush notified she is in hospital. -baclofen  FEN/GI: NPO pending swallow eval  Prophylaxis: SCD's  Disposition: admit to stepdown due to dilt drip  Subjective:  Patient with no new  neuro symptoms from admission.  No chest pain.  More concerned with her right sided headache and back pain.  Objective: Temp:  [98 F (36.7 C)-98.4 F (36.9 C)] 98.4 F (36.9 C) (03/14 0428) Pulse Rate:  [32-159] 159 (03/14 0428) Resp:  [16-24] 19 (03/14 0428) BP: (103-153)/(65-137) 112/88 (03/14 0428) SpO2:  [93 %-100 %] 99 % (03/14 0428) Weight:  [171 lb 1.2 oz (77.6 kg)] 171 lb 1.2 oz (77.6 kg) (03/14 0228) Physical Exam: General: NAD, pleasant Eyes: Right pupil is slightly smaller in diameter than the left, but reacts to light equally. EOMI, no conjunctival pallor or injection ENTM: Moist mucous membranes, no pharyngeal erythema or exudate Neck: Supple Cardiovascular: irregular,  irregular, tachycardia. No LE edema  Respiratory: CTA BL, normal work of breathing Gastrointestinal: soft, nontender, nondistended, normoactive BS MSK: moves 4 extremities equally, 5/5 strength in BLUE, BLLE, rhuematoid changes in hands Derm: no rashes appreciated Neuro: left homonymous hemianopsia.  No dysarthria, aphasia or nystagmus. Cranial nerves otherwise intact. Normal sensation to light touch in upper and lower extremities bilaterally.  Lower back pain (chronic) Psych: AOx3, appropriate affect   Laboratory: Recent Labs  Lab 06/14/17 1555 06/14/17 1614 06/15/17 0317  WBC 10.9*  --  9.6  HGB 11.8* 12.9 11.1*  HCT 39.4 38.0 36.8  PLT 293  --  276   Recent Labs  Lab 06/14/17 1555 06/14/17 1614 06/15/17 0317  NA 141 142 140  K 3.5 3.5 3.2*  CL 107 107 107  CO2 23  --  21*  BUN 18 20 11   CREATININE 0.83 0.70 0.69  CALCIUM 9.2  --  8.7*  PROT 6.3*  --   --   BILITOT 0.7  --   --   ALKPHOS 131*  --   --   ALT 14  --   --   AST 15  --   --   GLUCOSE 123* 120* 168*    Imaging/Diagnostic Tests: Ct Angio Head W Or Wo Contrast  Result Date: 06/14/2017 CLINICAL DATA:  Headache and blurry vision EXAM: CT ANGIOGRAPHY HEAD AND NECK TECHNIQUE: Multidetector CT imaging of the head and  neck was performed using the standard protocol during bolus administration of intravenous contrast. Multiplanar CT image reconstructions and MIPs were obtained to evaluate the vascular anatomy. Carotid stenosis measurements (when applicable) are obtained utilizing NASCET criteria, using the distal internal carotid diameter as the denominator. CONTRAST:  40mL ISOVUE-370 IOPAMIDOL (ISOVUE-370) INJECTION 76% COMPARISON:  Head CT 06/14/2017 FINDINGS: CTA NECK FINDINGS Aortic arch: There is mild calcific atherosclerosis of the aortic arch. There is no aneurysm, dissection or hemodynamically significant stenosis of the visualized ascending aorta and aortic arch. Normal variant aortic arch branching pattern with the brachiocephalic and left common carotid arteries sharing a common origin. The visualized proximal subclavian arteries are widely patent. Right carotid system: The right common carotid origin is widely patent. There is no common carotid or internal carotid artery dissection or aneurysm. Minimal atherosclerotic calcification at the carotid bifurcation without hemodynamically significant stenosis. Left carotid system: The left common carotid origin is widely patent. There is no common carotid or internal carotid artery dissection or aneurysm. Mild atherosclerotic calcification at the carotid bifurcation without  hemodynamically significant stenosis. Vertebral arteries: The vertebral system is right dominant. Both vertebral artery origins are normal. Both vertebral arteries are normal to their confluence with the basilar artery. Skeleton: There is no bony spinal canal stenosis. No lytic or blastic lesions. Other neck: There are multiple thyroid nodules. The largest is on the right and measures 3 cm. Upper chest: No pneumothorax or pleural effusion. No nodules or masses. Review of the MIP images confirms the above findings CTA HEAD FINDINGS Anterior circulation: --Intracranial internal carotid arteries: Calcification  of both internal carotid arteries at the skull base without hemodynamically significant stenosis. --Anterior cerebral arteries: Normal. --Middle cerebral arteries: Normal. --Posterior communicating arteries: Absent bilaterally. Posterior circulation: --Posterior cerebral arteries: Normal. --Superior cerebellar arteries: Normal. --Basilar artery: Normal. --Anterior inferior cerebellar arteries: Normal. --Posterior inferior cerebellar arteries: Normal. Venous sinuses: As permitted by contrast timing, patent. Anatomic variants: None Delayed phase: No parenchymal contrast enhancement. Redemonstration of focal hypoattenuation in the right PCA distribution, now more conspicuous. No hemorrhage. No mass effect. Review of the MIP images confirms the above findings. IMPRESSION: 1. No emergent large vessel occlusion or flow-limiting stenosis. 2. Redemonstration of right PCA territory infarct with expected evolution, but no hemorrhage or mass effect. The right posterior cerebral artery is angiographically patent. 3. Mild atherosclerotic calcification at the aortic arch, carotid bifurcations and skull base segments of the internal carotid arteries without hemodynamically significant stenosis by NASCET criteria. ( Aortic Atherosclerosis (ICD10-I70.0). ) 4. **An incidental finding of potential clinical significance has been found. Multiple thyroid nodules, measuring up to 3 cm. Dedicated thyroid ultrasound is recommended for further characterization on a nonemergent outpatient basis. ** Electronically Signed   By: Ulyses Jarred M.D.   On: 06/14/2017 22:12   Ct Head Wo Contrast  Result Date: 06/14/2017 CLINICAL DATA:  Right-sided headache began last night and has become increasingly severe. Intermittent blurred vision. EXAM: CT HEAD WITHOUT CONTRAST TECHNIQUE: Contiguous axial images were obtained from the base of the skull through the vertex without intravenous contrast. COMPARISON:  None. FINDINGS: Brain: There is asymmetric  low-attenuation in the right occipital lobe. No evidence of acute hemorrhage, mass lesion, mass effect or hydrocephalus. Atrophy. Mild periventricular low attenuation. Remote right cerebellar infarct. Vascular: No hyperdense vessel or unexpected calcification. Skull: Normal. Negative for fracture or focal lesion. Sinuses/Orbits: No acute finding. Other: None. IMPRESSION: 1. Acute right posterior cerebral artery infarct.  No hemorrhage. 2. Atrophy and chronic microvascular white matter ischemic changes. 3. Remote right cerebellar infarct. Electronically Signed   By: Lorin Picket M.D.   On: 06/14/2017 16:39   Ct Angio Neck W Or Wo Contrast  Result Date: 06/14/2017 CLINICAL DATA:  Headache and blurry vision EXAM: CT ANGIOGRAPHY HEAD AND NECK TECHNIQUE: Multidetector CT imaging of the head and neck was performed using the standard protocol during bolus administration of intravenous contrast. Multiplanar CT image reconstructions and MIPs were obtained to evaluate the vascular anatomy. Carotid stenosis measurements (when applicable) are obtained utilizing NASCET criteria, using the distal internal carotid diameter as the denominator. CONTRAST:  47mL ISOVUE-370 IOPAMIDOL (ISOVUE-370) INJECTION 76% COMPARISON:  Head CT 06/14/2017 FINDINGS: CTA NECK FINDINGS Aortic arch: There is mild calcific atherosclerosis of the aortic arch. There is no aneurysm, dissection or hemodynamically significant stenosis of the visualized ascending aorta and aortic arch. Normal variant aortic arch branching pattern with the brachiocephalic and left common carotid arteries sharing a common origin. The visualized proximal subclavian arteries are widely patent. Right carotid system: The right common carotid origin is widely patent. There  is no common carotid or internal carotid artery dissection or aneurysm. Minimal atherosclerotic calcification at the carotid bifurcation without hemodynamically significant stenosis. Left carotid system: The  left common carotid origin is widely patent. There is no common carotid or internal carotid artery dissection or aneurysm. Mild atherosclerotic calcification at the carotid bifurcation without hemodynamically significant stenosis. Vertebral arteries: The vertebral system is right dominant. Both vertebral artery origins are normal. Both vertebral arteries are normal to their confluence with the basilar artery. Skeleton: There is no bony spinal canal stenosis. No lytic or blastic lesions. Other neck: There are multiple thyroid nodules. The largest is on the right and measures 3 cm. Upper chest: No pneumothorax or pleural effusion. No nodules or masses. Review of the MIP images confirms the above findings CTA HEAD FINDINGS Anterior circulation: --Intracranial internal carotid arteries: Calcification of both internal carotid arteries at the skull base without hemodynamically significant stenosis. --Anterior cerebral arteries: Normal. --Middle cerebral arteries: Normal. --Posterior communicating arteries: Absent bilaterally. Posterior circulation: --Posterior cerebral arteries: Normal. --Superior cerebellar arteries: Normal. --Basilar artery: Normal. --Anterior inferior cerebellar arteries: Normal. --Posterior inferior cerebellar arteries: Normal. Venous sinuses: As permitted by contrast timing, patent. Anatomic variants: None Delayed phase: No parenchymal contrast enhancement. Redemonstration of focal hypoattenuation in the right PCA distribution, now more conspicuous. No hemorrhage. No mass effect. Review of the MIP images confirms the above findings. IMPRESSION: 1. No emergent large vessel occlusion or flow-limiting stenosis. 2. Redemonstration of right PCA territory infarct with expected evolution, but no hemorrhage or mass effect. The right posterior cerebral artery is angiographically patent. 3. Mild atherosclerotic calcification at the aortic arch, carotid bifurcations and skull base segments of the internal  carotid arteries without hemodynamically significant stenosis by NASCET criteria. ( Aortic Atherosclerosis (ICD10-I70.0). ) 4. **An incidental finding of potential clinical significance has been found. Multiple thyroid nodules, measuring up to 3 cm. Dedicated thyroid ultrasound is recommended for further characterization on a nonemergent outpatient basis. ** Electronically Signed   By: Ulyses Jarred M.D.   On: 06/14/2017 22:12   Mr Brain Wo Contrast  Result Date: 06/15/2017 CLINICAL DATA:  Headache and blurry vision.  Right PCA infarct. EXAM: MRI HEAD WITHOUT CONTRAST TECHNIQUE: Multiplanar, multiecho pulse sequences of the brain and surrounding structures were obtained without intravenous contrast. COMPARISON:  CTA head neck 06/14/2017 FINDINGS: Brain: The midline structures are normal. There is abnormal diffusion restriction within the right occipital lobe, right temporal lobe and posterior right insula. These areas are within the right MCA and PCA territories. No mass lesion, hydrocephalus, dural abnormality or extra-axial collection. Old right cerebellar infarct. Mild periventricular T2 hyperintensity. There is hyperintense T2-weighted signal corresponding to the areas of abnormal diffusion restriction. Gradient echo imaging demonstrates petechial hemorrhage in the right PCA infarct distribution. No space-occupying hematoma. No age-advanced or lobar predominant atrophy. Vascular: Major intracranial arterial and venous sinus flow voids are preserved. Skull and upper cervical spine: The visualized skull base, calvarium, upper cervical spine and extracranial soft tissues are normal. Sinuses/Orbits: No fluid levels or advanced mucosal thickening. No mastoid or middle ear effusion. Normal orbits. IMPRESSION: 1. Acute infarcts of the posterior right middle cerebral artery territory and the right posterior cerebral artery territory. 2. Petechial hemorrhage in the distribution of the right PCA infarct. No  intraparenchymal hematoma. No mass effect or hydrocephalus. 3. Chronic small vessel disease. Electronically Signed   By: Ulyses Jarred M.D.   On: 06/15/2017 00:17     Sherene Sires, DO 06/15/2017, 5:49 AM PGY-1, [place]  [place] Intern pager: [phone], text pages welcome

## 2017-06-15 NOTE — Progress Notes (Signed)
Physical Therapy Evaluation Patient Details Name: [name] MRN: [id number] DOB: 1945/06/10 Today's Date: 06/15/2017   History of Present Illness  72 y.o. female past medical history ofHTN, DM2, HLD, chronic Afib on Xarelto, hx CVA presents to [facility] emergency developing headache yesterday afternoon and noticing difficulty with vision. The exact time of her last known normal is undetermined. CT head reveals a right PCA  Stroke. Patient on Xarelto for atrial fibrillation, however stopped it on Monday as she was scheduled to get a  nerve block for chronic back pain. MRI revealed Acute infarcts of the posterior right middle cerebral artery territory and the right posterior cerebral artery territory.Petechial hemorrhage in the distribution of the right PCA infarct.  Clinical Impression  Pt sitted EoB on entry after working with OT, HR fluctuating vastly between 116-146 bpm. Pt reports not feeling well and required minA for management of LE back into bed. PTA pt was occasionally using RW for ambulation, ambulating community distances performing shopping with her partner. Pt has experienced 3 falls in the last couple of months. Once A-fib is under control expect pt to be able to ambulate with RW. At this time PT recommending HHPT at discharge.  PT will follow acutely until d/c.     Follow Up Recommendations Home health PT;Supervision/Assistance - 24 hour    Equipment Recommendations  None recommended by PT       Precautions / Restrictions Precautions Precautions: Fall;Other (comment) Precaution Comments: watch HR Restrictions Weight Bearing Restrictions: No      Mobility  Bed Mobility Overal bed mobility: Needs Assistance Bed Mobility: Sit to Supine Rolling: Supervision   Supine to sit: Supervision Sit to supine: Min assist   General bed mobility comments: minA for management of LE into bed  Transfers                 General transfer comment: not performed secondary to  increased HR. Bed level eval only      Modified Rankin (Stroke Patients Only) Modified Rankin (Stroke Patients Only) Pre-Morbid Rankin Score: Slight disability Modified Rankin: Moderately severe disability     Balance Overall balance assessment: Needs assistance Sitting-balance support: Feet supported;No upper extremity supported Sitting balance-Leahy Scale: Fair Sitting balance - Comments: seated on EOB on entry without supervision                                     Pertinent Vitals/Pain Pain Assessment: No/denies pain  HR fluctuating throughout session ranging from 116-146 bpm     Home Living Family/patient expects to be discharged to:: Private residence Living Arrangements: Spouse/significant other Available Help at Discharge: Family Type of Home: House Home Access: Stairs to enter Entrance Stairs-Rails: Right;Left;Can reach both Entrance Stairs-Number of Steps: 3 Home Layout: One level Home Equipment: [place] - 4 wheels;Cane - single point Additional Comments: uses rollator the most at home    Prior Function Level of Independence: Independent with assistive device(s)         Comments: uses Rollator     Hand Dominance   Dominant Hand: Right    Extremity/Trunk Assessment   Upper Extremity Assessment Upper Extremity Assessment: Generalized weakness    Lower Extremity Assessment Lower Extremity Assessment: Generalized weakness       Communication   Communication: No difficulties  Cognition Arousal/Alertness: Awake/alert Behavior During Therapy: WFL for tasks assessed/performed Overall Cognitive Status: Within Functional Limits for tasks assessed  Assessment/Plan    PT Assessment Patient needs continued PT services  PT Problem List Decreased strength;Decreased activity tolerance;Decreased mobility;Cardiopulmonary status limiting activity       PT Treatment  Interventions DME instruction;Functional mobility training;Gait training;Stair training;Therapeutic activities;Therapeutic exercise;Balance training;Neuromuscular re-education;Patient/family education    PT Goals (Current goals can be found in the Care Plan section)  Acute Rehab PT Goals Patient Stated Goal: to get better and go home PT Goal Formulation: With patient Time For Goal Achievement: 06/29/17 Potential to Achieve Goals: Fair    Frequency Min 4X/week    AM-PAC PT "6 Clicks" Daily Activity  Outcome Measure Difficulty turning over in bed (including adjusting bedclothes, sheets and blankets)?: A Little Difficulty moving from lying on back to sitting on the side of the bed? : A Little Difficulty sitting down on and standing up from a chair with arms (e.g., wheelchair, bedside commode, etc,.)?: Unable Help needed moving to and from a bed to chair (including a wheelchair)?: Total Help needed walking in hospital room?: Total Help needed climbing 3-5 steps with a railing? : Total 6 Click Score: 10    End of Session   Activity Tolerance: Treatment limited secondary to medical complications (Comment)(HR fluctuation ) Patient left: in bed;with call bell/phone within reach Nurse Communication: Mobility status PT Visit Diagnosis: Other abnormalities of gait and mobility (R26.89);Muscle weakness (generalized) (M62.81);Difficulty in walking, not elsewhere classified (R26.2)    Time: 9030-0923 PT Time Calculation (min) (ACUTE ONLY): 10 min   Charges:   PT Evaluation $PT Eval Moderate Complexity: 1 Mod     PT G Codes:        Sruti Ayllon B. Migdalia Dk PT, DPT Acute Rehabilitation  [phone] Pager [phone]    [place] 06/15/2017, 3:43 PM

## 2017-06-15 NOTE — Progress Notes (Signed)
FPTS Interim Progress Note  S: Was paged by RN, Sam, that patients HR remaining in 130s. Patient has been on max dose of diltiazem drip since 3/13. Spoke to NP on call for cardiology who informed me cardiology would see patient. Called RN, Sam, to inform her that cardiology has been consulted.  -Awaiting cardiology recommendations  -can consider fluid bolus in interim prn   Caroline More, DO 06/15/2017, 6:44 PM PGY-1, [place] Medicine Service pager [phone]

## 2017-06-15 NOTE — Assessment & Plan Note (Signed)
Found on CT 06/14/2017: An incidental finding of potential clinical significance has been found. Multiple thyroid nodules, measuring up to 3 cm. Dedicated thyroid ultrasound is recommended for further characterization on a nonemergent outpatient basis.

## 2017-06-15 NOTE — Progress Notes (Addendum)
STROKE TEAM PROGRESS NOTE   SUBJECTIVE (INTERVAL HISTORY) Patient lying in bed, eyes closed. Denies pain. Reports HA improved from last night. Light that previously bothered eyes is resolved.   OBJECTIVE Temp:  [98 F (36.7 C)-98.8 F (37.1 C)] 98.8 F (37.1 C) (03/14 1253) Pulse Rate:  [32-159] 87 (03/14 1253) Cardiac Rhythm: Atrial fibrillation (03/14 0916) Resp:  [16-26] 24 (03/14 1253) BP: (103-153)/(65-137) 113/71 (03/14 1159) SpO2:  [93 %-100 %] 98 % (03/14 1253) Weight:  [77.6 kg (171 lb 1.2 oz)] 77.6 kg (171 lb 1.2 oz) (03/14 0228)  CBC:  Recent Labs  Lab 06/14/17 1555 06/14/17 1614 06/15/17 0317  WBC 10.9*  --  9.6  NEUTROABS 7.7  --   --   HGB 11.8* 12.9 11.1*  HCT 39.4 38.0 36.8  MCV 78.5  --  78.3  PLT 293  --  621    Basic Metabolic Panel:  Recent Labs  Lab 06/14/17 1555 06/14/17 1614 06/15/17 0317  NA 141 142 140  K 3.5 3.5 3.2*  CL 107 107 107  CO2 23  --  21*  GLUCOSE 123* 120* 168*  BUN 18 20 11   CREATININE 0.83 0.70 0.69  CALCIUM 9.2  --  8.7*    Lipid Panel:     Component Value Date/Time   CHOL 143 06/15/2017 0317   TRIG 229 (H) 06/15/2017 0317   HDL 31 (L) 06/15/2017 0317   CHOLHDL 4.6 06/15/2017 0317   VLDL 46 (H) 06/15/2017 0317   LDLCALC 66 06/15/2017 0317   HgbA1c:  Lab Results  Component Value Date   HGBA1C 6.9 (H) 06/15/2017   Urine Drug Screen: No results found for: LABOPIA, COCAINSCRNUR, LABBENZ, AMPHETMU, THCU, LABBARB  Alcohol Level     Component Value Date/Time   ETH <10 06/14/2017 1555    IMAGING  Ct Head Wo Contrast 06/14/2017 1. Acute right posterior cerebral artery infarct.  No hemorrhage. 2. Atrophy and chronic microvascular white matter ischemic changes. 3. Remote right cerebellar infarct.   Ct Angio Head W Or Wo Contrast Ct Angio Neck W Or Wo Contrast 06/14/2017 1. No emergent large vessel occlusion or flow-limiting stenosis. 2. Redemonstration of right PCA territory infarct with expected evolution, but  no hemorrhage or mass effect. The right posterior cerebral artery is angiographically patent. 3. Mild atherosclerotic calcification at the aortic arch, carotid bifurcations and skull base segments of the internal carotid arteries without hemodynamically significant stenosis by NASCET criteria. ( Aortic Atherosclerosis (ICD10-I70.0). ) 4. **An incidental finding of potential clinical significance has been found. Multiple thyroid nodules, measuring up to 3 cm. Dedicated thyroid ultrasound is recommended for further characterization on a nonemergent outpatient basis. ** Electronically Signed   By: Ulyses Jarred M.D.   On: 06/14/2017 22:12   Mr Brain Wo Contrast 06/15/2017 1. Acute infarcts of the posterior right middle cerebral artery territory and the right posterior cerebral artery territory. 2. Petechial hemorrhage in the distribution of the right PCA infarct. No intraparenchymal hematoma. No mass effect or hydrocephalus. 3. Chronic small vessel disease.    PHYSICAL EXAM Gen: Patient is well developed, well nourished elderly woman in no acute distress. Eyes closed Cardiac: HRIR IR, tachycardic. S1S2 audible. No M/R/G.  Extremities: Cap refill <2 secs. No cyanosis or edema. Pulses 2+ radial and DP. Pulmonary: Respirations regular, symmetric. Lungs clear to auscultation bilat.  MS: Alert, follows commands without difficulty. Oriented to person, place, time, and event. Speech: Speech fluent and non-dysarthric. Able to name and repeat.  CN: complete  L field cut. EOMs intact. PERL. Facial sensation intact. No facial droop. Hearing grossly intact. Tongue midline, full strength, no atrophy or fasciculations.  Strength: 5/5 in all four extremities proximally and distally.  Sensation: Intact to light touch in all four extremities and face Coordination: No ataxia or dysmetria on FTN or HTS bilat. Gait steady.    ASSESSMENT/PLAN [name] is a 72 y.o. female with history of HTN, DM, AF on xarelto,  RA, prior stroke, hx GIB presenting with HA and L eye vision abnormality. She did not receive IV t-PA.   Stroke:   Acute R MCA and subacute R PCA infarct w/ hemorrhagic transformation. Infarcts embolic secondary to known atrial fibrillation   Resultant  HA and L field cut  CT head R PCA infarct. Old R cerebellar infarct.  MRI head R post MCA & R PCA territory infarct. R PCA w/ petechial hmg. Small vessel disease.   CTA head & neck no ELVO. R PCA infarct w/ expected evolution. Mild AS at arch. Mult thyroid nodules up to 25mm  2D Echo  pending   LDL 66  HgbA1c 6.9  SCDs for VTE prophylaxis  Fall precautions  Diet heart healthy/carb modified Room service appropriate? Yes; Fluid consistency: Thin  Xarelto (rivaroxaban) daily prior to admission but on hold for injection, now on aspirin 325 mg daily given stroke size with hemorrhagic transformation. Recommend resuming xarelto (and stopping ASA) in 7 days  Patient counseled to be compliant with her antithrombotic medications  Ongoing aggressive stroke risk factor management  Therapy recommendations:  HH PT, OT, 24h supervision  Disposition:  Return home  AF w/ RVR  HR 130s during rounds  On cardizem drip  On xarelto PTA, held for planned back injection Thurs  Now on ASA d/t large size of stroke  Resume xarelto (stop ASA) in 7 days  Hypertension  Stable  Permissive hypertension (OK if < 220/120) but gradually normalize in 5-7 days  Long-term BP goal normotensive  Hyperlipidemia  Home meds:  crestor 10  LDL 66, goal < 70  Now on crestor 68  Ok to resume home statin dose at discharge  Type II Diabetes  HgbA1c 6.9, goal < 7.0  Controlled  Other Stroke Risk Factors  Advanced age  UDS not performed, ETOH level negative   Hx stroke/TIA per imaging  Family hx stroke (father)  Hx chronic diastolic CHF   PVD  Other Active Problems  Incidental mult thryoid nodules on CT, for TSH  pending  Hypokalemia  H/o GIB  RA  Iron deficiency anemia  Lumbar spondylosis & DDD  Hospital day # [place] for Pager information 06/15/2017 4:53 PM   ATTENDING NOTE: I reviewed above note and agree with the assessment and plan. I have made any additions or clarifications directly to the above note. Pt was seen and examined.   72 year old female with history of CKD, A. fib on Xarelto, CHF, HLD, HTN, PVD, diabetes, rheumatoid arthritis and history of stroke admitted for headache and decreased vision.  Exam showed left hemianopia.  She was on Xarelto until last Monday due to incoming procedures.  CT showed right PCA subacute stroke.  MRI showed right PCA subacute stroke with petechiae hemorrhage as well as right MCA inferior territory acute infarct.  CTA head and neck unremarkable.  2D echo pending.  LDL 66 and A1c 6.9.  Patient stroke most likely due to A. fib not on anti-correlation due  to incoming procedure.  Currently not able to restart a new patient due to large size of the stroke.  Continue aspirin 325.  Resume Xarelto in 7 days for stroke prevention.  Aspirin can be discontinued at that time.  Continue Crestor.  PT/OT evaluation Recommend home PT/OT.  Neurology will sign off. Please call with questions. Pt will follow up with stroke clinic NP at [facility] in about 4 weeks. Thanks for the consult.   Rosalin Hawking, MD PhD Stroke Neurology 06/15/2017 5:59 PM   To contact Stroke Continuity provider, please refer to [email or web address] After hours, contact General Neurology

## 2017-06-15 NOTE — Progress Notes (Signed)
Nutrition Brief Note  Patient identified on the Malnutrition Screening Tool (MST) Report  Wt Readings from Last 15 Encounters:  06/15/17 171 lb 1.2 oz (77.6 kg)  05/31/17 180 lb (81.6 kg)  04/19/17 180 lb 3.2 oz (81.7 kg)  09/09/16 183 lb (83 kg)  06/17/16 171 lb (77.6 kg)  05/31/16 171 lb 14.4 oz (78 kg)  05/26/16 173 lb 12.8 oz (78.8 kg)  05/16/16 178 lb 2.1 oz (80.8 kg)  05/11/16 180 lb 12.8 oz (82 kg)  03/29/16 171 lb 9.6 oz (77.8 kg)  03/23/16 177 lb (80.3 kg)  02/10/16 181 lb (82.1 kg)  02/03/16 181 lb (82.1 kg)  01/13/16 182 lb (82.6 kg)  12/02/15 178 lb (80.7 kg)   Chart reviewed, spoke with Ms. Hallmark at bedside. She states before presenting to the hospital she was eating well, no weight loss, no complaints. Was hungry during time of visit waiting for lunch tray.  Body mass index is 26.79 kg/m. Patient meets criteria for overweight based on current BMI.   Current diet order is heart healthy/carb modified, patient is consuming approximately 100% of meals at this time. Labs and medications reviewed.   No nutrition interventions warranted at this time. If nutrition issues arise, please consult RD.   Satira Anis. Janae Bonser, MS, RD LDN Inpatient Clinical Dietitian Pager [phone] '

## 2017-06-15 NOTE — Plan of Care (Signed)
  Progressing Education: Knowledge of General Education information will improve 06/15/2017 0525 - Progressing by Aris Lot, RN Health Behavior/Discharge Planning: Ability to manage health-related needs will improve 06/15/2017 0525 - Progressing by Aris Lot, RN Clinical Measurements: Ability to maintain clinical measurements within normal limits will improve 06/15/2017 0525 - Progressing by Aris Lot, RN Will remain free from infection 06/15/2017 0525 - Progressing by Aris Lot, RN Diagnostic test results will improve 06/15/2017 0525 - Progressing by Aris Lot, RN Respiratory complications will improve 06/15/2017 0525 - Progressing by Aris Lot, RN Cardiovascular complication will be avoided 06/15/2017 0525 - Progressing by Aris Lot, RN Activity: Risk for activity intolerance will decrease 06/15/2017 0525 - Progressing by Aris Lot, RN Nutrition: Adequate nutrition will be maintained 06/15/2017 0525 - Progressing by Aris Lot, RN Coping: Level of anxiety will decrease 06/15/2017 0525 - Progressing by Aris Lot, RN Elimination: Will not experience complications related to bowel motility 06/15/2017 0525 - Progressing by Aris Lot, RN Will not experience complications related to urinary retention 06/15/2017 0525 - Progressing by Aris Lot, RN Pain Managment: General experience of comfort will improve 06/15/2017 0525 - Progressing by Aris Lot, RN Safety: Ability to remain free from injury will improve 06/15/2017 0525 - Progressing by Aris Lot, RN Skin Integrity: Risk for impaired skin integrity will decrease 06/15/2017 0525 - Progressing by Aris Lot, RN Education: Knowledge of secondary prevention will improve 06/15/2017 0525 - Progressing by Aris Lot, RN Knowledge of patient specific risk factors addressed and post discharge goals established will  improve 06/15/2017 0525 - Progressing by Aris Lot, RN Self-Care: Verbalization of feelings and concerns over difficulty with self-care will improve 06/15/2017 0525 - Progressing by Aris Lot, RN

## 2017-06-16 ENCOUNTER — Other Ambulatory Visit (HOSPITAL_COMMUNITY): Payer: Medicare Other

## 2017-06-16 ENCOUNTER — Inpatient Hospital Stay (HOSPITAL_COMMUNITY): Payer: Medicare Other

## 2017-06-16 DIAGNOSIS — I4891 Unspecified atrial fibrillation: Secondary | ICD-10-CM

## 2017-06-16 DIAGNOSIS — I639 Cerebral infarction, unspecified: Secondary | ICD-10-CM

## 2017-06-16 LAB — CBC
HCT: 34.5 % — ABNORMAL LOW (ref 36.0–46.0)
HEMOGLOBIN: 10.4 g/dL — AB (ref 12.0–15.0)
MCH: 23.7 pg — AB (ref 26.0–34.0)
MCHC: 30.1 g/dL (ref 30.0–36.0)
MCV: 78.8 fL (ref 78.0–100.0)
PLATELETS: 264 10*3/uL (ref 150–400)
RBC: 4.38 MIL/uL (ref 3.87–5.11)
RDW: 18 % — ABNORMAL HIGH (ref 11.5–15.5)
WBC: 7.8 10*3/uL (ref 4.0–10.5)

## 2017-06-16 LAB — GLUCOSE, CAPILLARY
GLUCOSE-CAPILLARY: 147 mg/dL — AB (ref 65–99)
GLUCOSE-CAPILLARY: 153 mg/dL — AB (ref 65–99)
Glucose-Capillary: 158 mg/dL — ABNORMAL HIGH (ref 65–99)
Glucose-Capillary: 151 mg/dL — ABNORMAL HIGH (ref 65–99)

## 2017-06-16 LAB — ECHOCARDIOGRAM COMPLETE
Height: 67 in
Weight: 2737.23 oz

## 2017-06-16 LAB — BASIC METABOLIC PANEL
Anion gap: 10 (ref 5–15)
BUN: 8 mg/dL (ref 6–20)
CHLORIDE: 108 mmol/L (ref 101–111)
CO2: 21 mmol/L — ABNORMAL LOW (ref 22–32)
CREATININE: 0.73 mg/dL (ref 0.44–1.00)
Calcium: 9 mg/dL (ref 8.9–10.3)
Glucose, Bld: 133 mg/dL — ABNORMAL HIGH (ref 65–99)
POTASSIUM: 3.6 mmol/L (ref 3.5–5.1)
SODIUM: 139 mmol/L (ref 135–145)

## 2017-06-16 LAB — MRSA PCR SCREENING: MRSA BY PCR: NEGATIVE

## 2017-06-16 MED ORDER — BETHANECHOL CHLORIDE 10 MG PO TABS
5.0000 mg | ORAL_TABLET | Freq: Three times a day (TID) | ORAL | Status: DC
  Administered 2017-06-16 – 2017-06-19 (×9): 5 mg via ORAL
  Filled 2017-06-16 (×9): qty 1

## 2017-06-16 NOTE — Progress Notes (Signed)
Bladder scan performed during the previous shift showed 999.  This RN did in and out cath and 1450 was removed.  Pt reported relieve. She is in bed resting comfortably.  Will continue to monitor.

## 2017-06-16 NOTE — Care Management Note (Signed)
Case Management Note  Patient Details  Name: [name] MRN: [id number] Date of Birth: 1945/07/16  Subjective/Objective:          Presented with headache and blurry vision found to have R posteriorcerebral arteryCVA. PMH : HTN, DM2, HLD,chronicAfib/ Xarelto, CVA, GIbleed,Rheumatoid arthritis andHF.  PCP: [place] Reader  Action/Plan: Transition to home with the resumption of home health services when medically stable.   Expected Discharge Date:  06/19/17               Expected Discharge Plan:  [place]  In-House Referral:     Discharge planning Services  CM Consult  Post Acute Care Choice:    Choice offered to:  Patient  DME Arranged:  Wheelchair manual DME Agency:  [place]:  PT, RN [facility] Agency:  [facility]  Status of Service:  Completed, signed off  If discussed at [place] of Stay Meetings, dates discussed:    Additional Comments:  Sharin Mons, RN 06/16/2017, 2:40 PM

## 2017-06-16 NOTE — Progress Notes (Signed)
Family Medicine Teaching Service Daily Progress Note Intern Pager: [phone]  Patient name: [name] Medical record number: [id number] Date of birth: 06-12-1945 Age: 72 y.o. Gender: female  Primary Care Provider: Aletha Halim., PA-C Consultants: neuro/SLP/PT/OT Code Status: full  Pt Overview and Major Events to Date:  [name] is a 72 y.o. female presenting with headache and blurry vision found to have R posterior cerebral artery CVA. PMH is significant for HTN, DM2, HLD, chronic Afib on Xarelto, hx CVA, Hx of GI bleed, Rheumatoid arthritis and HFpEF.  Assessment and Plan: [name] is a 72 y.o. female presenting with headache and blurry vision found to have R posterior cerebral artery CVA. PMH is significant for HTN, DM2, HLD, chronic Afib on Xarelto, hx CVA, Hx of GI bleed, Rheumatoid arthritis and HFpEF.  Acute R posterior CVA: Acute right posterior cerebral artery infarct without hemorrhage on CT.  left homonymous hemianopsia.  Neurology feels that this may be embolic source for stroke with her history of Afib and being off xarelo for 3 days.  MRI brain w/ acute infarts of R middle PCA and R posterior PCA, also w/ petechial hemmorrhage in distribution of R PCA infarct.  A1C 6.9.  hypertriglyceridemia - Admit to stepdown, attending Dr. Andria Frames - neuro checks per protocol - neurology signed off - ECHO - permissive HTN to 180 -continue aspirin 325 until 3/21 -hold xarelto until 3/21 -increase risuvastatin to 20mg   Afib with RVR: 100s-120s on rounds.  Asymptomatic. H/o paroxysmal Afib on Xarelto. Patient recently stopped Xarelto on 06/12/2017 for procedure planned for tomorrow. Also on diltiazem 360mg  daily at home. - Will hold anticoagulation until 3/21 - 360 dilt XR PO - 100 metoprolol BID - ECHO pending   HTN: chronic.  Home Medications: Metoprolol 100mg  BID, Lasix 20mg  daily, Diltiazem 360mg  daily. - Allow permissive HTN - Holding home Lasix 20mg  -on  metoprolol 100BID for rate control  Type II DM: A1c 6.9  - Hold home Amaryl, and consider d/c at discharge  - Monitor CBG's with sSSI  Incidental Thyroid findings on CTA head: multiple nodules up to 3cm.  TSH WNL -Thyroid US  Hypokalemia- 3.2 -kdur 40  Hypertriglyceridemia: 229 -can be followed outpatient -risuvastatin increased per neuro for stroke  HLD: Last lipid panel in 2016. Chol 229 - increase  home Crestor to 20mg   H/o of GI bleed: Patient not on ASA at home, but does not appear to be related to GI bleed.  - Monitor for any signs of bleeding with daily cbc  Rheumatoid arthritis: Chronic. Stable. - Continue home Arava and prednisone 5 mg daily - patient is also on Orencia injection q Thursday, may just have her take when she is d/c'd  HFpEF: Last ECHO 04/06/2016 with EF: 55% - 60%, and calcified mitral valve annulus, as well as mildly thickened leaflets with mild mitral regurgitation. Clinically euvolemic.  - Repeat ECHO - holding Lasix for now for permissive HTN  Iron Deficiency Anemia: on Fe supplement at home. hgb stable  - monitor   Lumbar Spondylosis and DDD: MRI Lumbar spine on 05/26/17 with with prominent impingement at L4-5; moderate to prominent impingement at L3-4; and mild impingement at L5-S1.  Was scheduled for spinal epideral steroid injection today -Dr. Nelva Bush notified she is in hospital. -baclofen  FEN/GI: NPO pending swallow eval  Prophylaxis: SCD's  Disposition: stepdown, cardiac monitoring  Subjective:  Patient claims resolution of hemianopsia on exam.  She is optimistic about going home with home health//pt/ot as  she did want to avoid a SNF if possible  Objective: Temp:  [98.3 F (36.8 C)-99.8 F (37.7 C)] 99.8 F (37.7 C) (03/15 0036) Pulse Rate:  [41-140] 41 (03/15 0036) Resp:  [8-26] 20 (03/15 0036) BP: (113-151)/(70-138) 115/74 (03/15 0036) SpO2:  [93 %-99 %] 93 % (03/15 0036) Physical Exam: General: NAD, pleasant  Eyes:  EOMI, no conjunctival pallor or injection ENTM: Moist mucous membranes, no pharyngeal erythema or exudate Neck: Supple Cardiovascular: irregular,  irregular, tachycardia. No LE edema  Respiratory: CTA BL, normal work of breathing Gastrointestinal: soft, nontender, nondistended, normoactive BS MSK: moves 4 extremities equally, 5/5 strength in BLUE, BLLE, rhuematoid changes in hands Derm: no rashes appreciated Neuro: viusalr fields intact. No dysarthria, aphasia or nystagmus. Cranial nerves otherwise intact. Normal sensation to light touch in upper and lower extremities bilaterally.  Lower back pain (chronic) Psych: AOx3, appropriate affect   Laboratory: Recent Labs  Lab 06/14/17 1555 06/14/17 1614 06/15/17 0317  WBC 10.9*  --  9.6  HGB 11.8* 12.9 11.1*  HCT 39.4 38.0 36.8  PLT 293  --  276   Recent Labs  Lab 06/14/17 1555 06/14/17 1614 06/15/17 0317  NA 141 142 140  K 3.5 3.5 3.2*  CL 107 107 107  CO2 23  --  21*  BUN 18 20 11   CREATININE 0.83 0.70 0.69  CALCIUM 9.2  --  8.7*  PROT 6.3*  --   --   BILITOT 0.7  --   --   ALKPHOS 131*  --   --   ALT 14  --   --   AST 15  --   --   GLUCOSE 123* 120* 168*    Imaging/Diagnostic Tests: Ct Angio Head W Or Wo Contrast  Result Date: 06/14/2017 CLINICAL DATA:  Headache and blurry vision EXAM: CT ANGIOGRAPHY HEAD AND NECK TECHNIQUE: Multidetector CT imaging of the head and neck was performed using the standard protocol during bolus administration of intravenous contrast. Multiplanar CT image reconstructions and MIPs were obtained to evaluate the vascular anatomy. Carotid stenosis measurements (when applicable) are obtained utilizing NASCET criteria, using the distal internal carotid diameter as the denominator. CONTRAST:  66mL ISOVUE-370 IOPAMIDOL (ISOVUE-370) INJECTION 76% COMPARISON:  Head CT 06/14/2017 FINDINGS: CTA NECK FINDINGS Aortic arch: There is mild calcific atherosclerosis of the aortic arch. There is no aneurysm,  dissection or hemodynamically significant stenosis of the visualized ascending aorta and aortic arch. Normal variant aortic arch branching pattern with the brachiocephalic and left common carotid arteries sharing a common origin. The visualized proximal subclavian arteries are widely patent. Right carotid system: The right common carotid origin is widely patent. There is no common carotid or internal carotid artery dissection or aneurysm. Minimal atherosclerotic calcification at the carotid bifurcation without hemodynamically significant stenosis. Left carotid system: The left common carotid origin is widely patent. There is no common carotid or internal carotid artery dissection or aneurysm. Mild atherosclerotic calcification at the carotid bifurcation without hemodynamically significant stenosis. Vertebral arteries: The vertebral system is right dominant. Both vertebral artery origins are normal. Both vertebral arteries are normal to their confluence with the basilar artery. Skeleton: There is no bony spinal canal stenosis. No lytic or blastic lesions. Other neck: There are multiple thyroid nodules. The largest is on the right and measures 3 cm. Upper chest: No pneumothorax or pleural effusion. No nodules or masses. Review of the MIP images confirms the above findings CTA HEAD FINDINGS Anterior circulation: --Intracranial internal carotid arteries: Calcification of both internal  carotid arteries at the skull base without hemodynamically significant stenosis. --Anterior cerebral arteries: Normal. --Middle cerebral arteries: Normal. --Posterior communicating arteries: Absent bilaterally. Posterior circulation: --Posterior cerebral arteries: Normal. --Superior cerebellar arteries: Normal. --Basilar artery: Normal. --Anterior inferior cerebellar arteries: Normal. --Posterior inferior cerebellar arteries: Normal. Venous sinuses: As permitted by contrast timing, patent. Anatomic variants: None Delayed phase: No  parenchymal contrast enhancement. Redemonstration of focal hypoattenuation in the right PCA distribution, now more conspicuous. No hemorrhage. No mass effect. Review of the MIP images confirms the above findings. IMPRESSION: 1. No emergent large vessel occlusion or flow-limiting stenosis. 2. Redemonstration of right PCA territory infarct with expected evolution, but no hemorrhage or mass effect. The right posterior cerebral artery is angiographically patent. 3. Mild atherosclerotic calcification at the aortic arch, carotid bifurcations and skull base segments of the internal carotid arteries without hemodynamically significant stenosis by NASCET criteria. ( Aortic Atherosclerosis (ICD10-I70.0). ) 4. **An incidental finding of potential clinical significance has been found. Multiple thyroid nodules, measuring up to 3 cm. Dedicated thyroid ultrasound is recommended for further characterization on a nonemergent outpatient basis. ** Electronically Signed   By: Ulyses Jarred M.D.   On: 06/14/2017 22:12   Ct Head Wo Contrast  Result Date: 06/14/2017 CLINICAL DATA:  Right-sided headache began last night and has become increasingly severe. Intermittent blurred vision. EXAM: CT HEAD WITHOUT CONTRAST TECHNIQUE: Contiguous axial images were obtained from the base of the skull through the vertex without intravenous contrast. COMPARISON:  None. FINDINGS: Brain: There is asymmetric low-attenuation in the right occipital lobe. No evidence of acute hemorrhage, mass lesion, mass effect or hydrocephalus. Atrophy. Mild periventricular low attenuation. Remote right cerebellar infarct. Vascular: No hyperdense vessel or unexpected calcification. Skull: Normal. Negative for fracture or focal lesion. Sinuses/Orbits: No acute finding. Other: None. IMPRESSION: 1. Acute right posterior cerebral artery infarct.  No hemorrhage. 2. Atrophy and chronic microvascular white matter ischemic changes. 3. Remote right cerebellar infarct.  Electronically Signed   By: Lorin Picket M.D.   On: 06/14/2017 16:39   Ct Angio Neck W Or Wo Contrast  Result Date: 06/14/2017 CLINICAL DATA:  Headache and blurry vision EXAM: CT ANGIOGRAPHY HEAD AND NECK TECHNIQUE: Multidetector CT imaging of the head and neck was performed using the standard protocol during bolus administration of intravenous contrast. Multiplanar CT image reconstructions and MIPs were obtained to evaluate the vascular anatomy. Carotid stenosis measurements (when applicable) are obtained utilizing NASCET criteria, using the distal internal carotid diameter as the denominator. CONTRAST:  77mL ISOVUE-370 IOPAMIDOL (ISOVUE-370) INJECTION 76% COMPARISON:  Head CT 06/14/2017 FINDINGS: CTA NECK FINDINGS Aortic arch: There is mild calcific atherosclerosis of the aortic arch. There is no aneurysm, dissection or hemodynamically significant stenosis of the visualized ascending aorta and aortic arch. Normal variant aortic arch branching pattern with the brachiocephalic and left common carotid arteries sharing a common origin. The visualized proximal subclavian arteries are widely patent. Right carotid system: The right common carotid origin is widely patent. There is no common carotid or internal carotid artery dissection or aneurysm. Minimal atherosclerotic calcification at the carotid bifurcation without hemodynamically significant stenosis. Left carotid system: The left common carotid origin is widely patent. There is no common carotid or internal carotid artery dissection or aneurysm. Mild atherosclerotic calcification at the carotid bifurcation without hemodynamically significant stenosis. Vertebral arteries: The vertebral system is right dominant. Both vertebral artery origins are normal. Both vertebral arteries are normal to their confluence with the basilar artery. Skeleton: There is no bony spinal canal stenosis. No lytic  or blastic lesions. Other neck: There are multiple thyroid nodules. The  largest is on the right and measures 3 cm. Upper chest: No pneumothorax or pleural effusion. No nodules or masses. Review of the MIP images confirms the above findings CTA HEAD FINDINGS Anterior circulation: --Intracranial internal carotid arteries: Calcification of both internal carotid arteries at the skull base without hemodynamically significant stenosis. --Anterior cerebral arteries: Normal. --Middle cerebral arteries: Normal. --Posterior communicating arteries: Absent bilaterally. Posterior circulation: --Posterior cerebral arteries: Normal. --Superior cerebellar arteries: Normal. --Basilar artery: Normal. --Anterior inferior cerebellar arteries: Normal. --Posterior inferior cerebellar arteries: Normal. Venous sinuses: As permitted by contrast timing, patent. Anatomic variants: None Delayed phase: No parenchymal contrast enhancement. Redemonstration of focal hypoattenuation in the right PCA distribution, now more conspicuous. No hemorrhage. No mass effect. Review of the MIP images confirms the above findings. IMPRESSION: 1. No emergent large vessel occlusion or flow-limiting stenosis. 2. Redemonstration of right PCA territory infarct with expected evolution, but no hemorrhage or mass effect. The right posterior cerebral artery is angiographically patent. 3. Mild atherosclerotic calcification at the aortic arch, carotid bifurcations and skull base segments of the internal carotid arteries without hemodynamically significant stenosis by NASCET criteria. ( Aortic Atherosclerosis (ICD10-I70.0). ) 4. **An incidental finding of potential clinical significance has been found. Multiple thyroid nodules, measuring up to 3 cm. Dedicated thyroid ultrasound is recommended for further characterization on a nonemergent outpatient basis. ** Electronically Signed   By: Ulyses Jarred M.D.   On: 06/14/2017 22:12   Mr Brain Wo Contrast  Result Date: 06/15/2017 CLINICAL DATA:  Headache and blurry vision.  Right PCA infarct.  EXAM: MRI HEAD WITHOUT CONTRAST TECHNIQUE: Multiplanar, multiecho pulse sequences of the brain and surrounding structures were obtained without intravenous contrast. COMPARISON:  CTA head neck 06/14/2017 FINDINGS: Brain: The midline structures are normal. There is abnormal diffusion restriction within the right occipital lobe, right temporal lobe and posterior right insula. These areas are within the right MCA and PCA territories. No mass lesion, hydrocephalus, dural abnormality or extra-axial collection. Old right cerebellar infarct. Mild periventricular T2 hyperintensity. There is hyperintense T2-weighted signal corresponding to the areas of abnormal diffusion restriction. Gradient echo imaging demonstrates petechial hemorrhage in the right PCA infarct distribution. No space-occupying hematoma. No age-advanced or lobar predominant atrophy. Vascular: Major intracranial arterial and venous sinus flow voids are preserved. Skull and upper cervical spine: The visualized skull base, calvarium, upper cervical spine and extracranial soft tissues are normal. Sinuses/Orbits: No fluid levels or advanced mucosal thickening. No mastoid or middle ear effusion. Normal orbits. IMPRESSION: 1. Acute infarcts of the posterior right middle cerebral artery territory and the right posterior cerebral artery territory. 2. Petechial hemorrhage in the distribution of the right PCA infarct. No intraparenchymal hematoma. No mass effect or hydrocephalus. 3. Chronic small vessel disease. Electronically Signed   By: Ulyses Jarred M.D.   On: 06/15/2017 00:17     Sherene Sires, DO 06/16/2017, 5:39 AM PGY-1, [facility] Intern pager: [phone], text pages welcome

## 2017-06-16 NOTE — Progress Notes (Addendum)
Physical Therapy Treatment Patient Details Name: [name] MRN: [id number] DOB: 1946-02-10 Today's Date: 06/16/2017    History of Present Illness 72 y.o. female past medical history ofHTN, DM2, HLD, chronic Afib on Xarelto, hx CVA presents to [facility] emergency developing headache yesterday afternoon and noticing difficulty with vision. The exact time of her last known normal is undetermined. CT head reveals a right PCA  Stroke. Patient on Xarelto for atrial fibrillation, however stopped it on Monday as she was scheduled to get a  nerve block for chronic back pain. MRI revealed Acute infarcts of the posterior right middle cerebral artery territory and the right posterior cerebral artery territory.Petechial hemorrhage in the distribution of the right PCA infarct.    PT Comments    Pt received sitting up at EOB, just finished lunch, noted only right side of plate was eaten, with left side mostly untouched. AF well controlled, HR in 80s upon arrival, low 100s during AMB to doorway and back. Left visual field deficits are very noticeable this session, and quite limiting to the patients ability to perform basic mobility, safe RW management, and access the environment in a safe way. Pt is mildly impulsive at times, and at others easily distractible. AMB is limited to less than 20ft per effort with easily visualized fatigue, pt confirming verbally. Strongly recommend SLP cognitive evaluation given current deficits. This session, also assisting the patient with bimanual tasks with minimal success, very poor visual acknowledgement of the left side of room in spite of heavy verbal cues. No clear awareness of Left visual field deficits, concerning for safety at DC. Given patient strong baseline falls history as it pertains to her lumbar spine pathology, recommendations for [facility] are strongly encouraged, patient agreeable, reports she has made attempts to get one recently, but reports some paperwork issues with  insurance.   Patient suffers from chronic baseline gait instability which impairs their ability to perform daily activities like access of the home environment for meals and toiletting in the home.  A walker alone will not resolve the issues with performing activities of daily living. A wheelchair will allow patient to safely perform daily activities.  The patient can self propel in the home or has a caregiver who can provide assistance.         Follow Up Recommendations  Home health PT;Supervision/Assistance - 24 hour     Equipment Recommendations  Wheelchair (measurements PT);Wheelchair cushion (measurements PT)(Anti tipper devices, )    Recommendations for Other Services       Precautions / Restrictions Precautions Precautions: Fall;Other (comment) Precaution Comments: watch HR Restrictions Weight Bearing Restrictions: No    Mobility  Bed Mobility               General bed mobility comments: received sitting EOB  Transfers Overall transfer level: Needs assistance Equipment used: Rolling walker (2 wheeled) Transfers: Sit to/from Stand Sit to Stand: Min guard         General transfer comment: slow, heavy reliance on BUE on bed ot come to standing; s/p aMB< throws self into bed combined with 45 degree rotational turn, abdubt, unsafe use of RW.  Ambulation/Gait Ambulation/Gait assistance: Min assist Ambulation Distance (Feet): 18 Feet(18ft + 62ft ) Assistive device: Rolling walker (2 wheeled)       General Gait Details: lacks significant performance of straight plane ambulation, possibly a communication artifact from using visual cues verbally for instruction and patient with significant limitations Left visual field. VEry limited, and fatigued into hall,  has to rest for 2 minutes prior to AMB back to bed. She endorses this as baseline distance tolerance.    Stairs            Wheelchair Mobility    Modified Rankin (Stroke Patients Only)         Balance Overall balance assessment: Needs assistance Sitting-balance support: No upper extremity supported;Feet unsupported Sitting balance-Leahy Scale: Good     Standing balance support: Bilateral upper extremity supported;During functional activity Standing balance-Leahy Scale: Poor Standing balance comment: poor management of RW, low confidence with gait stability, and poor awareness of visual defcitis.                             Cognition Arousal/Alertness: Awake/alert Behavior During Therapy: WFL for tasks assessed/performed Overall Cognitive Status: Difficult to assess                                 General Comments: seem somewhat distracted at times, difficulty attending to task, visual impairments are limiting, and not folloowing Lt v Rt verbal cues.       Exercises      General Comments        Pertinent Vitals/Pain Pain Assessment: No/denies pain    Home Living                      Prior Function            PT Goals (current goals can now be found in the care plan section) Acute Rehab PT Goals Patient Stated Goal: to get better and go home PT Goal Formulation: With patient Time For Goal Achievement: 06/29/17 Potential to Achieve Goals: Fair Progress towards PT goals: Not progressing toward goals - comment    Frequency    Min 4X/week      PT Plan      Co-evaluation              AM-PAC PT "6 Clicks" Daily Activity  Outcome Measure  Difficulty turning over in bed (including adjusting bedclothes, sheets and blankets)?: A Little Difficulty moving from lying on back to sitting on the side of the bed? : A Little Difficulty sitting down on and standing up from a chair with arms (e.g., wheelchair, bedside commode, etc,.)?: A Lot Help needed moving to and from a bed to chair (including a wheelchair)?: A Little Help needed walking in hospital room?: A Lot Help needed climbing 3-5 steps with a railing? : Total 6  Click Score: 14    End of Session Equipment Utilized During Treatment: Gait belt Activity Tolerance: Patient tolerated treatment well;No increased pain Patient left: in bed;with call bell/phone within reach Nurse Communication: Mobility status PT Visit Diagnosis: Other abnormalities of gait and mobility (R26.89);Muscle weakness (generalized) (M62.81);Difficulty in walking, not elsewhere classified (R26.2)     Time: 6073-7106 PT Time Calculation (min) (ACUTE ONLY): 26 min  Charges:  $Therapeutic Activity: 23-37 mins                    G Codes:       2:07 PM, June 23, 2017 Etta Grandchild, PT, DPT Physical Therapist - [place] [phone] (Pager)  [phone] (Office)       Audra Kagel C 2017-06-23, 1:52 PM

## 2017-06-16 NOTE — Progress Notes (Signed)
  Echocardiogram 2D Echocardiogram has been performed.  [name] 06/16/2017, 2:52 PM

## 2017-06-17 DIAGNOSIS — I340 Nonrheumatic mitral (valve) insufficiency: Secondary | ICD-10-CM

## 2017-06-17 LAB — CBC
HEMATOCRIT: 36 % (ref 36.0–46.0)
HEMOGLOBIN: 10.7 g/dL — AB (ref 12.0–15.0)
MCH: 23.5 pg — AB (ref 26.0–34.0)
MCHC: 29.7 g/dL — ABNORMAL LOW (ref 30.0–36.0)
MCV: 79.1 fL (ref 78.0–100.0)
Platelets: 249 10*3/uL (ref 150–400)
RBC: 4.55 MIL/uL (ref 3.87–5.11)
RDW: 18 % — AB (ref 11.5–15.5)
WBC: 7.3 10*3/uL (ref 4.0–10.5)

## 2017-06-17 LAB — GLUCOSE, CAPILLARY
GLUCOSE-CAPILLARY: 158 mg/dL — AB (ref 65–99)
GLUCOSE-CAPILLARY: 250 mg/dL — AB (ref 65–99)
GLUCOSE-CAPILLARY: 190 mg/dL — AB (ref 65–99)
Glucose-Capillary: 152 mg/dL — ABNORMAL HIGH (ref 65–99)

## 2017-06-17 NOTE — Progress Notes (Addendum)
Physical Therapy Treatment Patient Details Name: [name] MRN: [id number] DOB: 05-Oct-1945 Today's Date: 06/17/2017    History of Present Illness 72 y.o. female past medical history of HTN, DM2, HLD, chronic Afib on Xarelto, hx CVA presents to [facility] emergency developing headache yesterday afternoon and noticing difficulty with vision. The exact time of her last known normal is undetermined. CT head reveals a right PCA  Stroke. Patient on Xarelto for atrial fibrillation, however stopped it on Monday as she was scheduled to get a  nerve block for chronic back pain. MRI revealed Acute infarcts of the posterior right middle cerebral artery territory and the right posterior cerebral artery territory.Petechial hemorrhage in the distribution of the right PCA infarct.    PT Comments    Pt with severe L sided neglect and decreased insight to safety and deficits. Pt remains at an increased falls risk and requires modA with amb without AD and minA with RW. Spoke with MD and son, [name] regarding d/c recommendation of CIR. Son agrees as pt's spouse isn't able to provide more than supervision to patient. Son has been trying have patient move in with him. Pt is an excellent CIR candidate. Will notify MD and CIR admissions the change in  D/c recommendations.  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 Restrictions Weight Bearing Restrictions: No    Mobility  Bed Mobility Overal bed mobility: Needs Assistance Bed Mobility: Sit to Supine Rolling: Supervision   Supine to sit: Supervision     General bed mobility comments: HOB elevated, used bed rail  Transfers Overall transfer level: Needs  assistance Equipment used: 1 person hand held assist Transfers: Sit to/from Stand Sit to Stand: Min assist         General transfer comment: Min assist to power up and steady.   Ambulation/Gait Ambulation/Gait assistance: Min assist Ambulation Distance (Feet): 15 Feet(x2) Assistive device: Rolling walker (2 wheeled) Gait Pattern/deviations: Step-through pattern;Decreased stride length;Shuffle;Wide base of support Gait velocity: slow Gait velocity interpretation: Below normal speed for age/gender General Gait Details: pt very unsteady, wide stance with minimal foot clearance, pt very rigid due to fear. pt with h/o 5 falls in last month   Stairs            Wheelchair Mobility    Modified Rankin (Stroke Patients Only) Modified Rankin (Stroke Patients Only) Pre-Morbid Rankin Score: Slight disability Modified Rankin: Moderately severe disability     Balance Overall balance assessment: Needs assistance Sitting-balance support: No upper extremity supported;Feet unsupported Sitting balance-Leahy Scale: Good Sitting balance - Comments: seated on EOB on entry without supervision   Standing balance support: Bilateral upper extremity supported;During functional activity Standing balance-Leahy Scale: Poor Standing balance comment: dependent on UE support                            Cognition Arousal/Alertness: Awake/alert Behavior During Therapy: WFL for tasks assessed/performed Overall Cognitive Status: Impaired/Different from baseline Area of Impairment: Attention;Following commands;Safety/judgement;Awareness;Problem solving;Memory                   Current Attention Level: Selective Memory: Decreased short-term memory Following Commands: Follows one step commands with increased time;Follows multi-step commands inconsistently Safety/Judgement: Decreased awareness of safety;Decreased  awareness of deficits(L sided neglect) Awareness: Emergent Problem  Solving: Slow processing;Difficulty sequencing;Requires verbal cues;Requires tactile cues General Comments: pt with L hemiopnopsia, unaware of L sided neglect.      Exercises      General Comments General comments (skin integrity, edema, etc.): Had pt draw shapes, house and face on a piece of paper, pt only used R side of paper .both sides were not symmetrical. L side would be super skinny and narrow compared to the R      Pertinent Vitals/Pain Pain Assessment: No/denies pain    Home Living                      Prior Function            PT Goals (current goals can now be found in the care plan section) Acute Rehab PT Goals Patient Stated Goal: to get better and go home Progress towards PT goals: Progressing toward goals    Frequency    Min 4X/week      PT Plan Discharge plan needs to be updated    Co-evaluation              AM-PAC PT "6 Clicks" Daily Activity  Outcome Measure  Difficulty turning over in bed (including adjusting bedclothes, sheets and blankets)?: A Little Difficulty moving from lying on back to sitting on the side of the bed? : A Little Difficulty sitting down on and standing up from a chair with arms (e.g., wheelchair, bedside commode, etc,.)?: A Lot Help needed moving to and from a bed to chair (including a wheelchair)?: A Little Help needed walking in hospital room?: A Lot Help needed climbing 3-5 steps with a railing? : Total 6 Click Score: 14    End of Session Equipment Utilized During Treatment: Gait belt Activity Tolerance: Patient tolerated treatment well;No increased pain Patient left: with call bell/phone within reach;in chair;with chair alarm set Nurse Communication: Mobility status PT Visit Diagnosis: Other abnormalities of gait and mobility (R26.89);Muscle weakness (generalized) (M62.81);Difficulty in walking, not elsewhere classified (R26.2)     Time: 2202-5427 PT Time Calculation (min) (ACUTE ONLY): 45  min  Charges:  $Gait Training: 8-22 mins $Therapeutic Activity: 8-22 mins $Neuromuscular Re-education: 8-22 mins                    G Codes:       Kittie Plater, PT, DPT Pager #: [phone] Office #: [phone]    [place] 06/17/2017, 1:44 PM

## 2017-06-17 NOTE — Progress Notes (Signed)
Family Medicine Teaching Service Daily Progress Note Intern Pager: [phone]  Patient name: [name] Medical record number: [id number] Date of birth: 1945/10/02 Age: 72 y.o. Gender: female  Primary Care Provider: Aletha Halim., PA-C Consultants: neuro/SLP/PT/OT Code Status: full  Pt Overview and Major Events to Date:  TREMEKA [name] is a 72 y.o. female presenting with headache and blurry vision found to have R posterior cerebral artery CVA. PMH is significant for HTN, DM2, HLD, chronic Afib on Xarelto, hx CVA, Hx of GI bleed, Rheumatoid arthritis and HFpEF.  Assessment and Plan: [name] is a 72 y.o. female presenting with headache and blurry vision found to have R posterior cerebral artery CVA. PMH is significant for HTN, DM2, HLD, chronic Afib on Xarelto, hx CVA, Hx of GI bleed, Rheumatoid arthritis and HFpEF.  Acute R posterior CVA: Acute right posterior cerebral artery infarct without hemorrhage on CT.  left homonymous hemianopsia.  Neurology feels that this may be embolic source for stroke with her history of Afib and being off xarelo for 3 days.  MRI brain w/ acute infarts of R middle PCA and R posterior PCA, also w/ petechial hemmorrhage in distribution of R PCA infarct.  A1C 6.9.  hypertriglyceridemia - Admit to stepdown, attending Dr. Andria Frames - neuro checks per protocol - neurology signed off inpatient - permissive HTN to 180 -continue aspirin 325 until 3/21 -hold xarelto until 3/21 -increase risuvastatin to 20mg   Afib with RVR: 100s-120s on rounds.  Asymptomatic. H/o paroxysmal Afib on Xarelto. Patient recently stopped Xarelto on 06/12/2017 for procedure planned for tomorrow. Also on diltiazem 360mg  daily at home. - Will hold anticoagulation until 3/21 - 360 dilt XR PO - 100 metoprolol BID - ECHO pending   HTN: chronic.  Home Medications: Metoprolol 100mg  BID, Lasix 20mg  daily, Diltiazem 360mg  daily. - Allow permissive HTN - Holding home Lasix 20mg  -on  metoprolol 100BID for rate control  Type II DM: A1c 6.9  - Hold home Amaryl, and consider d/c at discharge  - Monitor CBG's with sSSI  Incidental Thyroid findings on CTA head: multiple nodules up to 3cm.  TSH WNL -Thyroid US  Hypokalemia- 3.2 -kdur 40  Hypertriglyceridemia: 229 -can be followed outpatient -risuvastatin increased per neuro for stroke  HLD: Last lipid panel in 2016. Chol 229 - increase  home Crestor to 20mg   H/o of GI bleed: Patient not on ASA at home, but does not appear to be related to GI bleed.  - Monitor for any signs of bleeding with daily cbc  Rheumatoid arthritis: Chronic. Stable. - Continue home Arava and prednisone 5 mg daily - patient is also on Orencia injection q Thursday, may just have her take when she is d/c'd  Chronic HFpEF: ECHO EF: 60%-65%, and calcified mitral valve annulus, as well as mildly thickened leaflets with mild mitral regurgitation. Clinically euvolemic.   Patient with mitral regurgitation - holding Lasix for now for permissive HTN  Mitral regurgitation: on echo -can follow with cardiology outpatient  Iron Deficiency Anemia: on Fe supplement at home. hgb stable  - monitor   Lumbar Spondylosis and DDD: MRI Lumbar spine on 05/26/17 with with prominent impingement at L4-5; moderate to prominent impingement at L3-4; and mild impingement at L5-S1.  Was scheduled for spinal epideral steroid injection today -Dr. Nelva Bush notified she is in hospital. -baclofen  FEN/GI: NPO pending swallow eval  Prophylaxis: SCD's  Disposition: tele, dispo pending SLP cognitive eval.. Then home with home health pt/ot  Subjective:  Patient was  too sleepy for exam and asked to be left to sleep  Objective: Temp:  [98.4 F (36.9 C)-98.7 F (37.1 C)] 98.6 F (37 C) (03/15 2201) Pulse Rate:  [70-109] 109 (03/15 2201) Resp:  [15-20] 20 (03/15 2201) BP: (95-150)/(54-91) 123/67 (03/15 2201) SpO2:  [97 %-99 %] 97 % (03/15 2201) Physical  Exam: *Patient refused exam as she said she was too sleepy. General: NAD, sleepy Eyes:  ENTM: Neck:  Cardiovascular: irregular,  irregular, tachycardia. 2/6 murmur No LE edema  Respiratory: CTA BL, normal work of breathing Gastrointestinal: soft, nontender, nondistended, normoactive BS MSK: moves 4 extremities equally, 5/5 strength in BLUE, BLLE, rhuematoid changes in hands Derm: no rashes appreciated Neuro: viusalr fields intact. No dysarthria, aphasia or nystagmus. Cranial nerves otherwise intact. Normal sensation to light touch in upper and lower extremities bilaterally.  Lower back pain (chronic) Psych: AOx3, appropriate affect   Laboratory: Recent Labs  Lab 06/14/17 1555 06/14/17 1614 06/15/17 0317 06/16/17 0507  WBC 10.9*  --  9.6 7.8  HGB 11.8* 12.9 11.1* 10.4*  HCT 39.4 38.0 36.8 34.5*  PLT 293  --  276 264   Recent Labs  Lab 06/14/17 1555 06/14/17 1614 06/15/17 0317 06/16/17 0507  NA 141 142 140 139  K 3.5 3.5 3.2* 3.6  CL 107 107 107 108  CO2 23  --  21* 21*  BUN 18 20 11 8   CREATININE 0.83 0.70 0.69 0.73  CALCIUM 9.2  --  8.7* 9.0  PROT 6.3*  --   --   --   BILITOT 0.7  --   --   --   ALKPHOS 131*  --   --   --   ALT 14  --   --   --   AST 15  --   --   --   GLUCOSE 123* 120* 168* 133*    Imaging/Diagnostic Tests: Ct Angio Head W Or Wo Contrast  Result Date: 06/14/2017 CLINICAL DATA:  Headache and blurry vision EXAM: CT ANGIOGRAPHY HEAD AND NECK TECHNIQUE: Multidetector CT imaging of the head and neck was performed using the standard protocol during bolus administration of intravenous contrast. Multiplanar CT image reconstructions and MIPs were obtained to evaluate the vascular anatomy. Carotid stenosis measurements (when applicable) are obtained utilizing NASCET criteria, using the distal internal carotid diameter as the denominator. CONTRAST:  56mL ISOVUE-370 IOPAMIDOL (ISOVUE-370) INJECTION 76% COMPARISON:  Head CT 06/14/2017 FINDINGS: CTA NECK  FINDINGS Aortic arch: There is mild calcific atherosclerosis of the aortic arch. There is no aneurysm, dissection or hemodynamically significant stenosis of the visualized ascending aorta and aortic arch. Normal variant aortic arch branching pattern with the brachiocephalic and left common carotid arteries sharing a common origin. The visualized proximal subclavian arteries are widely patent. Right carotid system: The right common carotid origin is widely patent. There is no common carotid or internal carotid artery dissection or aneurysm. Minimal atherosclerotic calcification at the carotid bifurcation without hemodynamically significant stenosis. Left carotid system: The left common carotid origin is widely patent. There is no common carotid or internal carotid artery dissection or aneurysm. Mild atherosclerotic calcification at the carotid bifurcation without hemodynamically significant stenosis. Vertebral arteries: The vertebral system is right dominant. Both vertebral artery origins are normal. Both vertebral arteries are normal to their confluence with the basilar artery. Skeleton: There is no bony spinal canal stenosis. No lytic or blastic lesions. Other neck: There are multiple thyroid nodules. The largest is on the right and measures 3 cm. Upper chest:  No pneumothorax or pleural effusion. No nodules or masses. Review of the MIP images confirms the above findings CTA HEAD FINDINGS Anterior circulation: --Intracranial internal carotid arteries: Calcification of both internal carotid arteries at the skull base without hemodynamically significant stenosis. --Anterior cerebral arteries: Normal. --Middle cerebral arteries: Normal. --Posterior communicating arteries: Absent bilaterally. Posterior circulation: --Posterior cerebral arteries: Normal. --Superior cerebellar arteries: Normal. --Basilar artery: Normal. --Anterior inferior cerebellar arteries: Normal. --Posterior inferior cerebellar arteries: Normal.  Venous sinuses: As permitted by contrast timing, patent. Anatomic variants: None Delayed phase: No parenchymal contrast enhancement. Redemonstration of focal hypoattenuation in the right PCA distribution, now more conspicuous. No hemorrhage. No mass effect. Review of the MIP images confirms the above findings. IMPRESSION: 1. No emergent large vessel occlusion or flow-limiting stenosis. 2. Redemonstration of right PCA territory infarct with expected evolution, but no hemorrhage or mass effect. The right posterior cerebral artery is angiographically patent. 3. Mild atherosclerotic calcification at the aortic arch, carotid bifurcations and skull base segments of the internal carotid arteries without hemodynamically significant stenosis by NASCET criteria. ( Aortic Atherosclerosis (ICD10-I70.0). ) 4. **An incidental finding of potential clinical significance has been found. Multiple thyroid nodules, measuring up to 3 cm. Dedicated thyroid ultrasound is recommended for further characterization on a nonemergent outpatient basis. ** Electronically Signed   By: Ulyses Jarred M.D.   On: 06/14/2017 22:12   Ct Head Wo Contrast  Result Date: 06/14/2017 CLINICAL DATA:  Right-sided headache began last night and has become increasingly severe. Intermittent blurred vision. EXAM: CT HEAD WITHOUT CONTRAST TECHNIQUE: Contiguous axial images were obtained from the base of the skull through the vertex without intravenous contrast. COMPARISON:  None. FINDINGS: Brain: There is asymmetric low-attenuation in the right occipital lobe. No evidence of acute hemorrhage, mass lesion, mass effect or hydrocephalus. Atrophy. Mild periventricular low attenuation. Remote right cerebellar infarct. Vascular: No hyperdense vessel or unexpected calcification. Skull: Normal. Negative for fracture or focal lesion. Sinuses/Orbits: No acute finding. Other: None. IMPRESSION: 1. Acute right posterior cerebral artery infarct.  No hemorrhage. 2. Atrophy and  chronic microvascular white matter ischemic changes. 3. Remote right cerebellar infarct. Electronically Signed   By: Lorin Picket M.D.   On: 06/14/2017 16:39   Ct Angio Neck W Or Wo Contrast  Result Date: 06/14/2017 CLINICAL DATA:  Headache and blurry vision EXAM: CT ANGIOGRAPHY HEAD AND NECK TECHNIQUE: Multidetector CT imaging of the head and neck was performed using the standard protocol during bolus administration of intravenous contrast. Multiplanar CT image reconstructions and MIPs were obtained to evaluate the vascular anatomy. Carotid stenosis measurements (when applicable) are obtained utilizing NASCET criteria, using the distal internal carotid diameter as the denominator. CONTRAST:  67mL ISOVUE-370 IOPAMIDOL (ISOVUE-370) INJECTION 76% COMPARISON:  Head CT 06/14/2017 FINDINGS: CTA NECK FINDINGS Aortic arch: There is mild calcific atherosclerosis of the aortic arch. There is no aneurysm, dissection or hemodynamically significant stenosis of the visualized ascending aorta and aortic arch. Normal variant aortic arch branching pattern with the brachiocephalic and left common carotid arteries sharing a common origin. The visualized proximal subclavian arteries are widely patent. Right carotid system: The right common carotid origin is widely patent. There is no common carotid or internal carotid artery dissection or aneurysm. Minimal atherosclerotic calcification at the carotid bifurcation without hemodynamically significant stenosis. Left carotid system: The left common carotid origin is widely patent. There is no common carotid or internal carotid artery dissection or aneurysm. Mild atherosclerotic calcification at the carotid bifurcation without hemodynamically significant stenosis. Vertebral arteries: The vertebral system  is right dominant. Both vertebral artery origins are normal. Both vertebral arteries are normal to their confluence with the basilar artery. Skeleton: There is no bony spinal canal  stenosis. No lytic or blastic lesions. Other neck: There are multiple thyroid nodules. The largest is on the right and measures 3 cm. Upper chest: No pneumothorax or pleural effusion. No nodules or masses. Review of the MIP images confirms the above findings CTA HEAD FINDINGS Anterior circulation: --Intracranial internal carotid arteries: Calcification of both internal carotid arteries at the skull base without hemodynamically significant stenosis. --Anterior cerebral arteries: Normal. --Middle cerebral arteries: Normal. --Posterior communicating arteries: Absent bilaterally. Posterior circulation: --Posterior cerebral arteries: Normal. --Superior cerebellar arteries: Normal. --Basilar artery: Normal. --Anterior inferior cerebellar arteries: Normal. --Posterior inferior cerebellar arteries: Normal. Venous sinuses: As permitted by contrast timing, patent. Anatomic variants: None Delayed phase: No parenchymal contrast enhancement. Redemonstration of focal hypoattenuation in the right PCA distribution, now more conspicuous. No hemorrhage. No mass effect. Review of the MIP images confirms the above findings. IMPRESSION: 1. No emergent large vessel occlusion or flow-limiting stenosis. 2. Redemonstration of right PCA territory infarct with expected evolution, but no hemorrhage or mass effect. The right posterior cerebral artery is angiographically patent. 3. Mild atherosclerotic calcification at the aortic arch, carotid bifurcations and skull base segments of the internal carotid arteries without hemodynamically significant stenosis by NASCET criteria. ( Aortic Atherosclerosis (ICD10-I70.0). ) 4. **An incidental finding of potential clinical significance has been found. Multiple thyroid nodules, measuring up to 3 cm. Dedicated thyroid ultrasound is recommended for further characterization on a nonemergent outpatient basis. ** Electronically Signed   By: Ulyses Jarred M.D.   On: 06/14/2017 22:12   Mr Brain Wo  Contrast  Result Date: 06/15/2017 CLINICAL DATA:  Headache and blurry vision.  Right PCA infarct. EXAM: MRI HEAD WITHOUT CONTRAST TECHNIQUE: Multiplanar, multiecho pulse sequences of the brain and surrounding structures were obtained without intravenous contrast. COMPARISON:  CTA head neck 06/14/2017 FINDINGS: Brain: The midline structures are normal. There is abnormal diffusion restriction within the right occipital lobe, right temporal lobe and posterior right insula. These areas are within the right MCA and PCA territories. No mass lesion, hydrocephalus, dural abnormality or extra-axial collection. Old right cerebellar infarct. Mild periventricular T2 hyperintensity. There is hyperintense T2-weighted signal corresponding to the areas of abnormal diffusion restriction. Gradient echo imaging demonstrates petechial hemorrhage in the right PCA infarct distribution. No space-occupying hematoma. No age-advanced or lobar predominant atrophy. Vascular: Major intracranial arterial and venous sinus flow voids are preserved. Skull and upper cervical spine: The visualized skull base, calvarium, upper cervical spine and extracranial soft tissues are normal. Sinuses/Orbits: No fluid levels or advanced mucosal thickening. No mastoid or middle ear effusion. Normal orbits. IMPRESSION: 1. Acute infarcts of the posterior right middle cerebral artery territory and the right posterior cerebral artery territory. 2. Petechial hemorrhage in the distribution of the right PCA infarct. No intraparenchymal hematoma. No mass effect or hydrocephalus. 3. Chronic small vessel disease. Electronically Signed   By: Ulyses Jarred M.D.   On: 06/15/2017 00:17     Sherene Sires, DO 06/17/2017, 4:57 AM PGY-1, [place] Intern pager: [phone], text pages welcome

## 2017-06-17 NOTE — Progress Notes (Signed)
Rehab Admissions Coordinator Note:  Patient was screened by Cleatrice Burke for appropriateness for an Inpatient Acute Rehab Consult per PT and OT recommendation.  At this time, we are recommending Inpatient Rehab consult.  Cleatrice Burke 06/17/2017, 6:08 PM  I can be reached at [phone].

## 2017-06-17 NOTE — Progress Notes (Addendum)
Occupational Therapy Treatment Patient Details Name: [name] MRN: [id number] DOB: [birth year] Today's Date: 06/17/2017    History of present illness 72 y.o. female past medical history of HTN, DM2, HLD, chronic Afib on Xarelto, hx CVA presents to [facility] emergency department after developing headache yesterday afternoon and noticing difficulty with vision. The exact time of her last known normal is undetermined. CT head reveals a right PCA  Stroke. Patient on Xarelto for atrial fibrillation, however stopped it on Monday as she was scheduled to get a  nerve block for chronic back pain. MRI revealed Acute infarcts of the posterior right middle cerebral artery territory and the right posterior cerebral artery territory.Petechial hemorrhage in the distribution of the right PCA infarct.   OT comments    Pt seen to progress with plan of care. However, pt with significant L sided visual and visual perceptual deficits greatly impacting her ability to participate in daily self-care tasks. Pt presenting with significant L sided visual field cut this session. She is unable to read the L side of her cell-phone screen and could not make out text messages due to this deficit. She was also unable to identify items on the L side of her tray table without maximum cues and anchoring techniques. She demonstrates little awareness of these visual deficits. Additionally, she demonstrates significant inability to visualize L visual field when completing line bisection and clock drawing tasks despite significant cues (see images below). Functionally, pt required min assist for UB ADL as well as standing grooming tasks and mod assist to navigate her environment and manage RW secondary to these visual and perceptual deficits. She was unable to locate hot water faucet to the L and continued to attempt to turn on cold water repeatedly in order to "make it warm."   Due to the above stated visual, cognitive, and functional  deficits, feel that pt is highly unsafe to return home at current functional level. However, she demonstrates good rehabilitation potential to return to modified independent PLOF from OT perspective. Feel she would best benefit from the intensity and focus on CIR level therapies prior to returning home in order to maximize safety and return to independence. Also discussed with PT, Ashly, who is in agreement. Family reports that they will be able to provide 24 hour assistance. OT will continue to follow while admitted.   Line bisection attempt:   Clock drawing attempt: (OT providing clock outline)     Follow Up Recommendations  CIR;Supervision/Assistance - 24 hour    Equipment Recommendations  Other (comment)(TBD at next venue of care)    Recommendations for Other Services      Precautions / Restrictions Precautions Precautions: Fall;Other (comment) Precaution Comments: watch HR Restrictions Weight Bearing Restrictions: No       Mobility Bed Mobility Overal bed mobility: Needs Assistance Bed Mobility: Sit to Supine Rolling: Supervision   Supine to sit: Supervision     General bed mobility comments: Supervision for safety.   Transfers Overall transfer level: Needs assistance Equipment used: Rolling walker (2 wheeled) Transfers: Sit to/from Stand Sit to Stand: Min assist         General transfer comment: Min assist to power up and steady.     Balance Overall balance assessment: Needs assistance Sitting-balance support: No upper extremity supported;Feet unsupported Sitting balance-Leahy Scale: Good     Standing balance support: Bilateral upper extremity supported;During functional activity Standing balance-Leahy Scale: Poor Standing balance comment: noted anxiety concerning ambulation,  ADL either performed or assessed with clinical judgement   ADL Overall ADL's : Needs assistance/impaired Eating/Feeding: Minimal  assistance;Sitting Eating/Feeding Details (indicate cue type and reason): max cues and anchoring techniques to attend to L side of tray Grooming: Min guard;Standing Grooming Details (indicate cue type and reason): max cues to locate hot water faucet and sequence to turn on water Upper Body Bathing: Minimal assistance;Sitting   Lower Body Bathing: Min guard;Sit to/from stand   Upper Body Dressing : Minimal assistance;Sitting   Lower Body Dressing: Min guard;Sit to/from stand   Toilet Transfer: Ambulation;RW;Cueing for sequencing;Cueing for safety;Moderate assistance Toilet Transfer Details (indicate cue type and reason): cues and physical assistance to navigate as unable to attend to or visualize L side of environment         Functional mobility during ADLs: Minimal assistance;Rolling walker General ADL Comments: Significant assistance to navigate due to running into items on the L. Unable to read her cell phone and only sees R side of screen.      Vision   Vision Assessment?: Yes Eye Alignment: Within Functional Limits Ocular Range of Motion: Within Functional Limits Alignment/Gaze Preference: Within Defined Limits Tracking/Visual Pursuits: Impaired - to be further tested in functional context Visual Fields: Left visual field deficit Additional Comments: Unable to visualize L side of plate without anchoring techniques. Pt only sees half of cell phone and unable to make sense of text message received. With field testing, complete L sided field cut in bilateral eyes consistent with potential L hemianopsia. See pictures in note concerning visual perceptual testing as well.    Perception     Praxis      Cognition Arousal/Alertness: Awake/alert Behavior During Therapy: WFL for tasks assessed/performed Overall Cognitive Status: Impaired/Different from baseline Area of Impairment: Attention;Following commands;Safety/judgement;Awareness;Problem solving                     Current Attention Level: Selective   Following Commands: Follows one step commands with increased time;Follows multi-step commands inconsistently Safety/Judgement: Decreased awareness of safety;Decreased awareness of deficits Awareness: Emergent Problem Solving: Slow processing;Difficulty sequencing General Comments: Limited due to visual impairments. Little understanding of her current deficits and their impact on function. Difficulty following multi-step commands and problem solving. However, this is likely impacted by significant visual deficits.         Exercises     Shoulder Instructions       General Comments Pt demonstrating very little awareness of current deficits. Unable to locate hot water faucet and attempting to turn on cold multiple times.     Pertinent Vitals/ Pain       Pain Assessment: No/denies pain  Home Living                                          Prior Functioning/Environment              Frequency  Min 2X/week        Progress Toward Goals  OT Goals(current goals can now be found in the care plan section)  Progress towards OT goals: Not progressing toward goals - comment(limited by visual deficits)  Acute Rehab OT Goals Patient Stated Goal: to get better and go home OT Goal Formulation: With patient Time For Goal Achievement: 06/29/17 Potential to Achieve Goals: Good ADL Goals Additional ADL Goal #1: Pt will utilize anchoring strategies to locate 75% of food  items to the L of midline on tray. Additional ADL Goal #2: Pt will utilize compensatory strategies to navigate environment with no more than 2 cues to avoid obstacles on the L.  Plan Discharge plan needs to be updated    Co-evaluation                 AM-PAC PT "6 Clicks" Daily Activity     Outcome Measure   Help from another person eating meals?: A Little Help from another person taking care of personal grooming?: A Little Help from another person  toileting, which includes using toliet, bedpan, or urinal?: A Lot Help from another person bathing (including washing, rinsing, drying)?: A Little Help from another person to put on and taking off regular upper body clothing?: A Little Help from another person to put on and taking off regular lower body clothing?: A Little 6 Click Score: 17    End of Session Equipment Utilized During Treatment: Gait belt;Rolling walker  OT Visit Diagnosis: Unsteadiness on feet (R26.81);Repeated falls (R29.6);Muscle weakness (generalized) (M62.81)   Activity Tolerance Patient tolerated treatment well   Patient Left in bed;with call bell/phone within reach;with bed alarm set   Nurse Communication Precautions;Mobility status(L sided visual deficits)        Time: 1010-1035 OT Time Calculation (min): 25 min  Charges: OT General Charges $OT Visit: 1 Visit OT Treatments $Self Care/Home Management : 8-22 mins $Therapeutic Activity: 8-22 mins  Norman Herrlich, MS OTR/L  Pager: [place] [facility] Yvonnie Schinke 06/17/2017, 12:43 PM

## 2017-06-18 DIAGNOSIS — I63431 Cerebral infarction due to embolism of right posterior cerebral artery: Principal | ICD-10-CM

## 2017-06-18 LAB — BASIC METABOLIC PANEL
Anion gap: 10 (ref 5–15)
BUN: 22 mg/dL — AB (ref 6–20)
CHLORIDE: 110 mmol/L (ref 101–111)
CO2: 20 mmol/L — AB (ref 22–32)
Calcium: 9.2 mg/dL (ref 8.9–10.3)
Creatinine, Ser: 1.07 mg/dL — ABNORMAL HIGH (ref 0.44–1.00)
GFR calc Af Amer: 59 mL/min — ABNORMAL LOW (ref >60)
GFR calc non Af Amer: 51 mL/min — ABNORMAL LOW (ref >60)
GLUCOSE: 176 mg/dL — AB (ref 65–99)
POTASSIUM: 3.6 mmol/L (ref 3.5–5.1)
Sodium: 140 mmol/L (ref 135–145)

## 2017-06-18 LAB — GLUCOSE, CAPILLARY
Glucose-Capillary: 169 mg/dL — ABNORMAL HIGH (ref 65–99)
Glucose-Capillary: 221 mg/dL — ABNORMAL HIGH (ref 65–99)
Glucose-Capillary: 185 mg/dL — ABNORMAL HIGH (ref 65–99)
Glucose-Capillary: 220 mg/dL — ABNORMAL HIGH (ref 65–99)

## 2017-06-18 LAB — CBC
HEMATOCRIT: 36.7 % (ref 36.0–46.0)
Hemoglobin: 10.9 g/dL — ABNORMAL LOW (ref 12.0–15.0)
MCH: 23.3 pg — AB (ref 26.0–34.0)
MCHC: 29.7 g/dL — AB (ref 30.0–36.0)
MCV: 78.4 fL (ref 78.0–100.0)
Platelets: 237 10*3/uL (ref 150–400)
RBC: 4.68 MIL/uL (ref 3.87–5.11)
RDW: 17.5 % — ABNORMAL HIGH (ref 11.5–15.5)
WBC: 10 10*3/uL (ref 4.0–10.5)

## 2017-06-18 NOTE — Evaluation (Signed)
Speech Language Pathology Evaluation Patient Details Name: [name] MRN: [id number] DOB: 1945/09/10 Today's Date: 06/18/2017 Time: 7619-5093 SLP Time Calculation (min) (ACUTE ONLY): 25 min  Problem List:  Patient Active Problem List   Diagnosis Date Noted  . Mitral regurgitation 06/17/2017  . Acute CVA (cerebrovascular accident) ([place])   . Thyroid nodule 06/15/2017  . Atrial fibrillation with RVR ([place]) 06/14/2017  . CVA (cerebral vascular accident) ([place]) 06/14/2017  . Type II diabetes mellitus ([place])   . Peripheral vascular disease ([place])   . Hyperlipidemia   . Hiatal hernia   . Edema   . Chronic diastolic (congestive) heart failure ([place])   . Carpal tunnel syndrome   . Atrial fibrillation ([place])   . Anemia   . Actinic keratosis   . Intractable nausea and vomiting 05/31/2016  . Nausea & vomiting 05/30/2016  . Abnormal chest x-ray 05/30/2016  . Diabetes mellitus with complication ([place])   . Influenza A 05/21/2016  . Chronic atrial fibrillation ([place]) 05/20/2016  . Acute CHF (congestive heart failure) ([place]) 05/20/2016  . History of GI bleed 03/29/2016  . Chronic anticoagulation 03/29/2016  . Symptomatic anemia 03/19/2016  . Spinal stenosis of lumbar region 02/10/2016  . Varicose veins of left leg with edema 09/15/2015  . Preop cardiovascular exam 08/25/2015  . Iron deficiency anemia due to chronic blood loss 07/17/2015  . Anemia of chronic renal failure, stage 3 (moderate) ([place]) 07/17/2015  . Anticoagulant causing adverse effect in therapeutic use 07/17/2015  . Symptomatic bradycardia 02/21/2015  . Bradycardia   . Acute deep vein thrombosis (DVT) of left lower extremity ([place]) 05/06/2014  . Abdominal wall mass of right lower quadrant 07/08/2013  . OA (osteoarthritis) of hip 04/03/2013  . Osteoarthritis of hip 01/18/2013  . Night sweats 09/06/2012  . Chest pain   . Anxiety and depression   . Gastroesophageal reflux disease   . Hepatitis   . Jaundice   . Hypertension  07/01/2010  . Hypercholesterolemia 07/01/2010  . DM2 (diabetes mellitus, type 2) ([place]) 07/01/2010  . Rheumatoid arthritis ([place]) 07/01/2010  . Sinus tachycardia 07/01/2010  . Chest pain, atypical 07/01/2010   Past Medical History:  Past Medical History:  Diagnosis Date  . Actinic keratosis   . Anemia    hx of  . Anemia of chronic renal failure, stage 3 (moderate) ([place]) 07/17/2015  . Anticoagulant causing adverse effect in therapeutic use 07/17/2015  . Atrial fibrillation ([place])    a. persistent, she remains on Xarelto  . Carpal tunnel syndrome   . Chronic diastolic (congestive) heart failure ([place])    a. 04/2016: echo showing a preserved EF of 55-60% with mild MR. LA and RA midly dilated.   . Edema    left leg at ankle resolved now  . Gastroesophageal reflux disease   . Hepatitis 1970   not sure what kind  . Hiatal hernia   . Hyperlipidemia   . Hypertension   . Iron deficiency anemia due to chronic blood loss 07/17/2015  . Jaundice    age 64  . Peripheral vascular disease ([place]) yrs ago   DVT left lower leg questionale told by 2 drs i had no clot, 1 md said i did  . Rheumatoid arthritis(714.0)   . Type II diabetes mellitus ([place])    type2   Past Surgical History:  Past Surgical History:  Procedure Laterality Date  . CATARACT EXTRACTION Bilateral   . COLONOSCOPY WITH PROPOFOL N/A 02/20/2015   Procedure: COLONOSCOPY WITH PROPOFOL;  Surgeon: Carol Ada,  MD;  Location: [facility] ENDOSCOPY;  Service: Endoscopy;  Laterality: N/A;  . ENTEROSCOPY N/A 03/23/2016   Procedure: ENTEROSCOPY;  Surgeon: Carol Ada, MD;  Location: [facility] ENDOSCOPY;  Service: Endoscopy;  Laterality: N/A;  . ENTEROSCOPY N/A 06/17/2016   Procedure: ENTEROSCOPY;  Surgeon: Carol Ada, MD;  Location: [facility] END[facility];  Service: Endoscopy;  Laterality: N/A;  . ESOPHAGOGASTRODUODENOSCOPY (EGD) WITH PROPOFOL N/A 02/20/2015   Procedure: ESOPHAGOGASTRODUODENOSCOPY (EGD) WITH PROPOFOL;  Surgeon: Carol Ada, MD;  Location: [facility]  ENDOSCOPY;  Service: Endoscopy;  Laterality: N/A;  . GIVENS CAPSULE STUDY N/A 03/21/2016   Procedure: GIVENS CAPSULE STUDY;  Surgeon: Carol Ada, MD;  Location: [facility] END[facility];  Service: Endoscopy;  Laterality: N/A;  . HAND SURGERY  1995 and 1996   artificial joints both hands  . JOINT REPLACEMENT    . LUMBAR LAMINECTOMY/DECOMPRESSION MICRODISCECTOMY N/A 02/10/2016   Procedure: MICROLUMBAR DECOMPRESSION L4-L5 AND L3- L4, AND EXCISION OF SYNOVIAL CYST L4-L5;  Surgeon: Susa Day, MD;  Location: [facility] ORS;  Service: Orthopedics;  Laterality: N/A;  . TOTAL HIP ARTHROPLASTY Left 2009  . TOTAL HIP ARTHROPLASTY Right 04/03/2013   Procedure: RIGHT TOTAL HIP ARTHROPLASTY ANTERIOR APPROACH;  Surgeon: Gearlean Alf, MD;  Location: [facility];  Service: Orthopedics;  Laterality: Right;   HPI:  72 y.o. female past medical history of HTN, DM2, HLD, chronic Afib on Xarelto, hx CVA presents to [facility] emergency developing headache yesterday afternoon and noticing difficulty with vision. The exact time of her last known normal is undetermined. CT head reveals a right PCA Stroke. Patient on Xarelto for atrial fibrillation, however stopped it on Monday as she was scheduled to get a nerve block for chronic back pain. MRI revealed Acute infarcts of the posterior right middle cerebral artery territory and the right posterior cerebral artery territory.Petechial hemorrhage in the distribution of the right PCA infarct.   Assessment / Plan / Recommendation Clinical Impression   Patient presents with severe cognitive communication impairment, deficits noted in intellectual awareness, attention, working memory, problem solving, sequencing, and visuoperception. Pt denies deficits however does acknowledge that her son feels something is different about her thinking since her stroke. Pt scored 10/30 on MoCA Basic, correlated with severe deficits. Left neglect impacted pt's performance in trailmaking, visuoperception, picture  naming and digit naming tasks. She recalled 0/5 words with a delay; 2/5 with category cues. In simple money problem solving task pt scored 0/3, without awareness of errors. With max cues, pt did acknowledge that she might have performed better on the assessment prior to her stroke. As pt grew frustrated with testing, she frequently closed her eyes and required multiple verbal cues for initiation; suspect decreased frustration tolerance and task persistence. Agree that with severity of her impairments she would benefit from skilled ST in CIR; while she did appear frustrated at times she was willing to continue working with SLP and was responsive to diagnostic interventions to improve attention and awareness.    SLP Assessment  SLP Recommendation/Assessment: Patient needs continued Speech Lanaguage Pathology Services SLP Visit Diagnosis: Attention and concentration deficit;Cognitive communication deficit (R41.841) Attention and concentration deficit following: Cerebral infarction    Follow Up Recommendations  Inpatient Rehab    Frequency and Duration min 2x/week  1 week      SLP Evaluation Cognition  Overall Cognitive Status: Impaired/Different from baseline Arousal/Alertness: Awake/alert Orientation Level: Oriented X4 Attention: Sustained;Selective Sustained Attention: Impaired Sustained Attention Impairment: Verbal basic;Functional basic(refocusing, verbal cues in conversation and cognitive tasks) Selective Attention: Impaired Selective Attention Impairment: Verbal basic;Functional  basic Memory: Impaired Memory Impairment: Decreased recall of new information;Storage deficit;Decreased short term memory(attention impacts) Decreased Short Term Memory: Verbal basic;Functional basic Awareness: Impaired Awareness Impairment: Intellectual impairment Problem Solving: Impaired Problem Solving Impairment: Functional basic(0/3 ways to make $13) Executive Function:  Sequencing;Organizing;Initiating;Self Correcting;Self Monitoring Sequencing: Impaired Sequencing Impairment: Verbal basic;Functional basic Organizing: Impaired Organizing Impairment: Verbal basic;Functional basic Initiating: Impaired Initiating Impairment: Verbal basic;Functional basic(cues for task initiation and to respond to simple questions) Self Monitoring: Impaired Self Monitoring Impairment: Functional basic Self Correcting: Impaired Self Correcting Impairment: Functional basic Behaviors: Poor frustration tolerance Safety/Judgment: Impaired       Comprehension  Auditory Comprehension Overall Auditory Comprehension: Impaired Yes/No Questions: Within Functional Limits Commands: Impaired One Step Basic Commands: 75-100% accurate Multistep Basic Commands: 50-74% accurate(repetition, cues required for mod complex commands) Conversation: Simple Interfering Components: Attention;Processing speed;Working memory EffectiveTechniques: Repetition;Visual/Gestural cues;Pausing;Extra processing time Visual Recognition/Discrimination Discrimination: Within Function Limits Reading Comprehension Reading Status: Not tested    Expression Expression Primary Mode of Expression: Verbal Verbal Expression Overall Verbal Expression: Appears within functional limits for tasks assessed Level of Generative/Spontaneous Verbalization: Conversation Naming: Impairment Divergent: 50-74% accurate(7 fruits in 60 seconds; 2 perseverations) Pragmatics: Impairment Impairments: Abnormal affect;Dysprosody;Eye contact;Monotone Interfering Components: Attention(decreased task persistence) Effective Techniques: Open ended questions Non-Verbal Means of Communication: Not applicable Written Expression Dominant Hand: Right Written Expression: Not tested   Oral / Motor  Oral Motor/Sensory Function Overall Oral Motor/Sensory Function: Within functional limits Motor Speech Overall Motor Speech: Appears within  functional limits for tasks assessed Respiration: Within functional limits Phonation: Normal Resonance: Within functional limits Articulation: Within functional limitis Intelligibility: Intelligible Motor Planning: Witnin functional limits Motor Speech Errors: Not applicable   [place], [place], [place] Pathologist [place] 06/18/2017, 5:58 PM

## 2017-06-18 NOTE — Progress Notes (Signed)
Family Medicine Teaching Service Daily Progress Note Intern Pager: [phone]  Patient name: [name] Medical record number: [id number] Date of birth: [birth year] Age: 72 y.o. Gender: female  Primary Care Provider: Aletha Halim., PA-C Consultants: neuro/SLP/PT/OT Code Status: full  Pt Overview and Major Events to Date:  [name] is a 72 y.o. female presenting with headache and blurry vision found to have R posterior cerebral artery CVA. PMH is significant for HTN, DM2, HLD, chronic Afib on Xarelto, hx CVA, Hx of GI bleed, Rheumatoid arthritis and HFpEF.  Assessment and Plan: [name] is a 72 y.o. female presenting with headache and blurry vision found to have R posterior cerebral artery CVA. PMH is significant for HTN, DM2, HLD, chronic Afib on Xarelto, hx CVA, Hx of GI bleed, Rheumatoid arthritis and HFpEF.  Acute R posterior CVA: Acute right posterior cerebral artery infarct without hemorrhage on CT.  left homonymous hemianopsia.  Neurology feels that this may be embolic source for stroke with her history of Afib and being off xarelo for 3 days.  MRI brain w/ acute infarts of R middle PCA and R posterior PCA, also w/ petechial hemmorrhage in distribution of R PCA infarct.  A1C 6.9.  hypertriglyceridemia - neuro checks per protocol - neurology signed off inpatient -continue aspirin 325 until 3/21 -hold xarelto until 3/21 -increase risuvastatin to 20mg   Afib with RVR: Currently rate controlled. Asymptomatic. H/o paroxysmal Afib on Xarelto. Patient recently stopped Xarelto on 06/12/2017 for procedure. Also on diltiazem 360mg  daily at home.Echo 3/15 with moderate hypertrophy, EF 60-65%, mild to moderate MR, and PA pressure of 49.  - Will hold anticoagulation until 3/21 - 360 dilt XR PO - 100 metoprolol BID   HTN: chronic.  Home Medications: Metoprolol 100mg  BID, Lasix 20mg  daily, Diltiazem 360mg  daily. - Holding home Lasix 20mg  -on metoprolol 100BID for rate control and  Diltiazem 360 mg   Type II DM: A1c 6.9  - Hold home Amaryl, and consider d/c at discharge  - Monitor CBG's with sSSI  Incidental Thyroid findings on CTA head: multiple nodules up to 3cm.  TSH WNL -Thyroid US  Hypokalemia- 3.2 -kdur 40  Hypertriglyceridemia: 229 -can be followed outpatient -risuvastatin increased per neuro for stroke  HLD: Last lipid panel in 2016. Chol 229 - increase  home Crestor to 20mg   H/o of GI bleed: Patient not on ASA at home, but does not appear to be related to GI bleed.  - Monitor for any signs of bleeding with daily cbc  Rheumatoid arthritis: Chronic. Stable. - Continue home Arava and prednisone 5 mg daily - patient is also on Orencia injection q Thursday, may just have her take when she is d/c'd  Chronic HFpEF: ECHO EF: 60%-65%, and calcified mitral valve annulus, as well as mildly thickened leaflets with mild mitral regurgitation. Clinically euvolemic.   Patient with mitral regurgitation - holding Lasix for now for permissive HTN  Mitral regurgitation: on echo -can follow with cardiology outpatient  Iron Deficiency Anemia: on Fe supplement at home. hgb stable  - monitor   Lumbar Spondylosis and DDD: MRI Lumbar spine on 05/26/17 with with prominent impingement at L4-5; moderate to prominent impingement at L3-4; and mild impingement at L5-S1.  Was scheduled for spinal epideral steroid injection today -Dr. Nelva Bush notified she is in hospital. -baclofen  FEN/GI: Heart healthy/carb modified  Prophylaxis: SCD's  Disposition: tele, dispo pending SLP cognitive eval.. Then home with home health pt/ot  Subjective:  No complaints this morning. Re-organizing  her purse.   Objective: Temp:  [97.8 F (36.6 C)-98.2 F (36.8 C)] 98.2 F (36.8 C) (03/17 0544) Pulse Rate:  [79-104] 98 (03/17 0823) Resp:  [17-19] 18 (03/17 0544) BP: (84-130)/(57-83) 130/63 (03/17 0823) SpO2:  [95 %-99 %] 99 % (03/17 0544) Physical Exam: General: NAD, sitting up  on side of bed  Cardiovascular: irregular rhythm. Regular rate. 2/6 murmur No LE edema  Respiratory: CTA BL, normal work of breathing Gastrointestinal: soft, nontender, nondistended, normoactive BS MSK: moves 4 extremities equally, 5/5 strength in BLUE, BLLE, rhuematoid changes in hands Derm: no rashes appreciated Neuro:  No dysarthria, aphasia or nystagmus. Normal sensation to light touch in upper and lower extremities bilaterally.  Psych: AOx3, appropriate affect   Laboratory: Recent Labs  Lab 06/16/17 0507 06/17/17 0527 06/18/17 0350  WBC 7.8 7.3 10.0  HGB 10.4* 10.7* 10.9*  HCT 34.5* 36.0 36.7  PLT 264 249 237   Recent Labs  Lab 06/14/17 1555  06/15/17 0317 06/16/17 0507 06/18/17 0350  NA 141   < > 140 139 140  K 3.5   < > 3.2* 3.6 3.6  CL 107   < > 107 108 110  CO2 23  --  21* 21* 20*  BUN 18   < > 11 8 22*  CREATININE 0.83   < > 0.69 0.73 1.07*  CALCIUM 9.2  --  8.7* 9.0 9.2  PROT 6.3*  --   --   --   --   BILITOT 0.7  --   --   --   --   ALKPHOS 131*  --   --   --   --   ALT 14  --   --   --   --   AST 15  --   --   --   --   GLUCOSE 123*   < > 168* 133* 176*   < > = values in this interval not displayed.    Imaging/Diagnostic Tests: Ct Angio Head W Or Wo Contrast  Result Date: 06/14/2017 CLINICAL DATA:  Headache and blurry vision EXAM: CT ANGIOGRAPHY HEAD AND NECK TECHNIQUE: Multidetector CT imaging of the head and neck was performed using the standard protocol during bolus administration of intravenous contrast. Multiplanar CT image reconstructions and MIPs were obtained to evaluate the vascular anatomy. Carotid stenosis measurements (when applicable) are obtained utilizing NASCET criteria, using the distal internal carotid diameter as the denominator. CONTRAST:  35mL ISOVUE-370 IOPAMIDOL (ISOVUE-370) INJECTION 76% COMPARISON:  Head CT 06/14/2017 FINDINGS: CTA NECK FINDINGS Aortic arch: There is mild calcific atherosclerosis of the aortic arch. There is no  aneurysm, dissection or hemodynamically significant stenosis of the visualized ascending aorta and aortic arch. Normal variant aortic arch branching pattern with the brachiocephalic and left common carotid arteries sharing a common origin. The visualized proximal subclavian arteries are widely patent. Right carotid system: The right common carotid origin is widely patent. There is no common carotid or internal carotid artery dissection or aneurysm. Minimal atherosclerotic calcification at the carotid bifurcation without hemodynamically significant stenosis. Left carotid system: The left common carotid origin is widely patent. There is no common carotid or internal carotid artery dissection or aneurysm. Mild atherosclerotic calcification at the carotid bifurcation without hemodynamically significant stenosis. Vertebral arteries: The vertebral system is right dominant. Both vertebral artery origins are normal. Both vertebral arteries are normal to their confluence with the basilar artery. Skeleton: There is no bony spinal canal stenosis. No lytic or blastic lesions. Other neck: There  are multiple thyroid nodules. The largest is on the right and measures 3 cm. Upper chest: No pneumothorax or pleural effusion. No nodules or masses. Review of the MIP images confirms the above findings CTA HEAD FINDINGS Anterior circulation: --Intracranial internal carotid arteries: Calcification of both internal carotid arteries at the skull base without hemodynamically significant stenosis. --Anterior cerebral arteries: Normal. --Middle cerebral arteries: Normal. --Posterior communicating arteries: Absent bilaterally. Posterior circulation: --Posterior cerebral arteries: Normal. --Superior cerebellar arteries: Normal. --Basilar artery: Normal. --Anterior inferior cerebellar arteries: Normal. --Posterior inferior cerebellar arteries: Normal. Venous sinuses: As permitted by contrast timing, patent. Anatomic variants: None Delayed phase:  No parenchymal contrast enhancement. Redemonstration of focal hypoattenuation in the right PCA distribution, now more conspicuous. No hemorrhage. No mass effect. Review of the MIP images confirms the above findings. IMPRESSION: 1. No emergent large vessel occlusion or flow-limiting stenosis. 2. Redemonstration of right PCA territory infarct with expected evolution, but no hemorrhage or mass effect. The right posterior cerebral artery is angiographically patent. 3. Mild atherosclerotic calcification at the aortic arch, carotid bifurcations and skull base segments of the internal carotid arteries without hemodynamically significant stenosis by NASCET criteria. ( Aortic Atherosclerosis (ICD10-I70.0). ) 4. **An incidental finding of potential clinical significance has been found. Multiple thyroid nodules, measuring up to 3 cm. Dedicated thyroid ultrasound is recommended for further characterization on a nonemergent outpatient basis. ** Electronically Signed   By: Ulyses Jarred M.D.   On: 06/14/2017 22:12   Ct Head Wo Contrast  Result Date: 06/14/2017 CLINICAL DATA:  Right-sided headache began last night and has become increasingly severe. Intermittent blurred vision. EXAM: CT HEAD WITHOUT CONTRAST TECHNIQUE: Contiguous axial images were obtained from the base of the skull through the vertex without intravenous contrast. COMPARISON:  None. FINDINGS: Brain: There is asymmetric low-attenuation in the right occipital lobe. No evidence of acute hemorrhage, mass lesion, mass effect or hydrocephalus. Atrophy. Mild periventricular low attenuation. Remote right cerebellar infarct. Vascular: No hyperdense vessel or unexpected calcification. Skull: Normal. Negative for fracture or focal lesion. Sinuses/Orbits: No acute finding. Other: None. IMPRESSION: 1. Acute right posterior cerebral artery infarct.  No hemorrhage. 2. Atrophy and chronic microvascular white matter ischemic changes. 3. Remote right cerebellar infarct.  Electronically Signed   By: Lorin Picket M.D.   On: 06/14/2017 16:39   Ct Angio Neck W Or Wo Contrast  Result Date: 06/14/2017 CLINICAL DATA:  Headache and blurry vision EXAM: CT ANGIOGRAPHY HEAD AND NECK TECHNIQUE: Multidetector CT imaging of the head and neck was performed using the standard protocol during bolus administration of intravenous contrast. Multiplanar CT image reconstructions and MIPs were obtained to evaluate the vascular anatomy. Carotid stenosis measurements (when applicable) are obtained utilizing NASCET criteria, using the distal internal carotid diameter as the denominator. CONTRAST:  41mL ISOVUE-370 IOPAMIDOL (ISOVUE-370) INJECTION 76% COMPARISON:  Head CT 06/14/2017 FINDINGS: CTA NECK FINDINGS Aortic arch: There is mild calcific atherosclerosis of the aortic arch. There is no aneurysm, dissection or hemodynamically significant stenosis of the visualized ascending aorta and aortic arch. Normal variant aortic arch branching pattern with the brachiocephalic and left common carotid arteries sharing a common origin. The visualized proximal subclavian arteries are widely patent. Right carotid system: The right common carotid origin is widely patent. There is no common carotid or internal carotid artery dissection or aneurysm. Minimal atherosclerotic calcification at the carotid bifurcation without hemodynamically significant stenosis. Left carotid system: The left common carotid origin is widely patent. There is no common carotid or internal carotid artery dissection or aneurysm.  Mild atherosclerotic calcification at the carotid bifurcation without hemodynamically significant stenosis. Vertebral arteries: The vertebral system is right dominant. Both vertebral artery origins are normal. Both vertebral arteries are normal to their confluence with the basilar artery. Skeleton: There is no bony spinal canal stenosis. No lytic or blastic lesions. Other neck: There are multiple thyroid nodules. The  largest is on the right and measures 3 cm. Upper chest: No pneumothorax or pleural effusion. No nodules or masses. Review of the MIP images confirms the above findings CTA HEAD FINDINGS Anterior circulation: --Intracranial internal carotid arteries: Calcification of both internal carotid arteries at the skull base without hemodynamically significant stenosis. --Anterior cerebral arteries: Normal. --Middle cerebral arteries: Normal. --Posterior communicating arteries: Absent bilaterally. Posterior circulation: --Posterior cerebral arteries: Normal. --Superior cerebellar arteries: Normal. --Basilar artery: Normal. --Anterior inferior cerebellar arteries: Normal. --Posterior inferior cerebellar arteries: Normal. Venous sinuses: As permitted by contrast timing, patent. Anatomic variants: None Delayed phase: No parenchymal contrast enhancement. Redemonstration of focal hypoattenuation in the right PCA distribution, now more conspicuous. No hemorrhage. No mass effect. Review of the MIP images confirms the above findings. IMPRESSION: 1. No emergent large vessel occlusion or flow-limiting stenosis. 2. Redemonstration of right PCA territory infarct with expected evolution, but no hemorrhage or mass effect. The right posterior cerebral artery is angiographically patent. 3. Mild atherosclerotic calcification at the aortic arch, carotid bifurcations and skull base segments of the internal carotid arteries without hemodynamically significant stenosis by NASCET criteria. ( Aortic Atherosclerosis (ICD10-I70.0). ) 4. **An incidental finding of potential clinical significance has been found. Multiple thyroid nodules, measuring up to 3 cm. Dedicated thyroid ultrasound is recommended for further characterization on a nonemergent outpatient basis. ** Electronically Signed   By: Ulyses Jarred M.D.   On: 06/14/2017 22:12   Mr Brain Wo Contrast  Result Date: 06/15/2017 CLINICAL DATA:  Headache and blurry vision.  Right PCA infarct.  EXAM: MRI HEAD WITHOUT CONTRAST TECHNIQUE: Multiplanar, multiecho pulse sequences of the brain and surrounding structures were obtained without intravenous contrast. COMPARISON:  CTA head neck 06/14/2017 FINDINGS: Brain: The midline structures are normal. There is abnormal diffusion restriction within the right occipital lobe, right temporal lobe and posterior right insula. These areas are within the right MCA and PCA territories. No mass lesion, hydrocephalus, dural abnormality or extra-axial collection. Old right cerebellar infarct. Mild periventricular T2 hyperintensity. There is hyperintense T2-weighted signal corresponding to the areas of abnormal diffusion restriction. Gradient echo imaging demonstrates petechial hemorrhage in the right PCA infarct distribution. No space-occupying hematoma. No age-advanced or lobar predominant atrophy. Vascular: Major intracranial arterial and venous sinus flow voids are preserved. Skull and upper cervical spine: The visualized skull base, calvarium, upper cervical spine and extracranial soft tissues are normal. Sinuses/Orbits: No fluid levels or advanced mucosal thickening. No mastoid or middle ear effusion. Normal orbits. IMPRESSION: 1. Acute infarcts of the posterior right middle cerebral artery territory and the right posterior cerebral artery territory. 2. Petechial hemorrhage in the distribution of the right PCA infarct. No intraparenchymal hematoma. No mass effect or hydrocephalus. 3. Chronic small vessel disease. Electronically Signed   By: Ulyses Jarred M.D.   On: 06/15/2017 00:17     Nicolette Bang, DO 06/18/2017, 8:42 AM PGY-3, Index Intern pager: [phone], text pages welcome

## 2017-06-18 NOTE — Consult Note (Signed)
Physical Medicine and Rehabilitation Consult Reason for Consult:impaired balance and vision Referring Physician: Family medicine   HPI: [name] is a 72 y.o. female with history of Afib, diabetes and RA who presented on 06/14/17 with headache and blurred vision after having stopped xarelto on 06/12/17 for a planned procedure.  MRI revealed acute infarcts of the posterior right middle cerebral artery territory and the right posterior cerebral artery territory with petechial hemorrhage in the distribution of the right PCA infarct. Source felt to be embolic. Anticoagulation being held until 3/21 with 325mg  ECASA daily up until then. Pt has demonstrated ongoing deficits in visual-spatial awareness and coordination. PM&R was consulted to assess for potential inpatient rehab needs.    Review of Systems  Constitutional: Negative for fever.  HENT: Negative for hearing loss.   Eyes: Positive for double vision.  Respiratory: Negative for cough.   Cardiovascular: Negative for chest pain.  Gastrointestinal: Negative for heartburn.  Genitourinary: Negative for urgency.  Musculoskeletal: Negative for neck pain.  Skin: Negative for rash.  Neurological: Positive for focal weakness and headaches.  Psychiatric/Behavioral: Negative for depression.   Past Medical History:  Diagnosis Date  . Actinic keratosis   . Anemia    hx of  . Anemia of chronic renal failure, stage 3 (moderate) ([place]) 07/17/2015  . Anticoagulant causing adverse effect in therapeutic use 07/17/2015  . Atrial fibrillation ([place])    a. persistent, she remains on Xarelto  . Carpal tunnel syndrome   . Chronic diastolic (congestive) heart failure ([place])    a. 04/2016: echo showing a preserved EF of 55-60% with mild MR. LA and RA midly dilated.   . Edema    left leg at ankle resolved now  . Gastroesophageal reflux disease   . Hepatitis 1970   not sure what kind  . Hiatal hernia   . Hyperlipidemia   . Hypertension   . Iron  deficiency anemia due to chronic blood loss 07/17/2015  . Jaundice    age 50  . Peripheral vascular disease ([place]) yrs ago   DVT left lower leg questionale told by 2 drs i had no clot, 1 md said i did  . Rheumatoid arthritis(714.0)   . Type II diabetes mellitus ([place])    type2   Past Surgical History:  Procedure Laterality Date  . CATARACT EXTRACTION Bilateral   . COLONOSCOPY WITH PROPOFOL N/A 02/20/2015   Procedure: COLONOSCOPY WITH PROPOFOL;  Surgeon: Carol Ada, MD;  Location: [facility];  Service: Endoscopy;  Laterality: N/A;  . ENTEROSCOPY N/A 03/23/2016   Procedure: ENTEROSCOPY;  Surgeon: Carol Ada, MD;  Location: [facility];  Service: Endoscopy;  Laterality: N/A;  . ENTEROSCOPY N/A 06/17/2016   Procedure: ENTEROSCOPY;  Surgeon: Carol Ada, MD;  Location: [facility];  Service: Endoscopy;  Laterality: N/A;  . ESOPHAGOGASTRODUODENOSCOPY (EGD) WITH PROPOFOL N/A 02/20/2015   Procedure: ESOPHAGOGASTRODUODENOSCOPY (EGD) WITH PROPOFOL;  Surgeon: Carol Ada, MD;  Location: [facility];  Service: Endoscopy;  Laterality: N/A;  . GIVENS CAPSULE STUDY N/A 03/21/2016   Procedure: GIVENS CAPSULE STUDY;  Surgeon: Carol Ada, MD;  Location: [facility];  Service: Endoscopy;  Laterality: N/A;  . HAND SURGERY  1995 and 1996   artificial joints both hands  . JOINT REPLACEMENT    . LUMBAR LAMINECTOMY/DECOMPRESSION MICRODISCECTOMY N/A 02/10/2016   Procedure: MICROLUMBAR DECOMPRESSION L4-L5 AND L3- L4, AND EXCISION OF SYNOVIAL CYST L4-L5;  Surgeon: Susa Day, MD;  Location: [facility];  Service: Orthopedics;  Laterality: N/A;  . TOTAL  HIP ARTHROPLASTY Left 2009  . TOTAL HIP ARTHROPLASTY Right 04/03/2013   Procedure: RIGHT TOTAL HIP ARTHROPLASTY ANTERIOR APPROACH;  Surgeon: Gearlean Alf, MD;  Location: [facility];  Service: Orthopedics;  Laterality: Right;   Family History  Problem Relation Age of Onset  . Stroke Father   . Heart failure Sister   . Hypertension Sister   . Heart attack  Neg Hx    Social History:  reports that  has never smoked. she has never used smokeless tobacco. She reports that she does not drink alcohol or use drugs. Allergies: No Known Allergies Medications Prior to Admission  Medication Sig Dispense Refill  . Abatacept (ORENCIA CLICKJECT) 176 MG/ML SOAJ Inject 125 mg into the skin every Thursday.     [name] buPROPion (WELLBUTRIN SR) 150 MG 12 hr tablet Take 150 mg by mouth 2 (two) times daily.      . Cholecalciferol (VITAMIN D3) 1000 units CAPS Take 1,000 Units by mouth every evening.     . Cyanocobalamin (VITAMIN B 12 PO) Take 1,000 mcg by mouth every evening.     . diltiazem (CARDIZEM CD) 360 MG 24 hr capsule TAKE 1 CAPSULE (360 MG TOTAL) BY MOUTH DAILY. 90 capsule 3  . esomeprazole (NEXIUM) 40 MG capsule Take 40 mg by mouth every morning.     . ferrous gluconate (FERGON) 324 MG tablet Take 1 tablet (324 mg total) by mouth 2 (two) times daily with a meal. 60 tablet 3  . furosemide (LASIX) 20 MG tablet Take 2 tablets (40 mg total) by mouth daily. Take 2 tablets by mouth daily. TAKE AN ADDITIONAL 1 TAB AS NEEDED 90 tablet 5  . gabapentin (NEURONTIN) 300 MG capsule Take 300 mg by mouth 2 (two) times daily as needed for pain.  1  . glimepiride (AMARYL) 2 MG tablet Take 2 mg by mouth daily.     [name] leflunomide (ARAVA) 20 MG tablet Take 20 mg by mouth daily.    . metFORMIN (GLUCOPHAGE-XR) 500 MG 24 hr tablet Take 1,000 mg by mouth 2 (two) times daily.    . metoprolol tartrate (LOPRESSOR) 50 MG tablet TAKE 2 TABLETS (100 MG TOTAL) BY MOUTH 2 (TWO) TIMES DAILY. 120 tablet 11  . Multiple Vitamins-Minerals (PRESERVISION AREDS 2) CAPS Take 1 capsule by mouth 2 (two) times daily.    . nitroGLYCERIN (NITROSTAT) 0.4 MG SL tablet Place 1 tablet (0.4 mg total) under the tongue every 5 (five) minutes as needed for chest pain (x 3 doses). 25 tablet 2  . potassium chloride (KLOR-CON 10) 10 MEQ tablet Take 2 tablets (20 mEq total) 2 (two) times daily by mouth. 360 tablet 3  .  predniSONE (DELTASONE) 5 MG tablet Take 5 mg by mouth daily.    . rivaroxaban (XARELTO) 20 MG TABS tablet Take 20 mg by mouth daily with supper.    . rosuvastatin (CRESTOR) 10 MG tablet Take 10 mg by mouth every evening.     [name] VOLTAREN 1 % GEL Apply 2 g topically at bedtime as needed for pain. Knees, calf  2  . HYDROcodone-acetaminophen (NORCO/VICODIN) 5-325 MG tablet Take 1 tablet by mouth every 6 (six) hours as needed for pain.  0    Home: Home Living Family/patient expects to be discharged to:: Private residence Living Arrangements: Spouse/significant other Available Help at Discharge: Family Type of Home: House Home Access: Stairs to enter Technical brewer of Steps: 3 Entrance Stairs-Rails: Right, Left, Can reach both Home Layout: One level Bathroom Shower/Tub: Tub/shower  unit Bathroom Toilet: Handicapped height Bathroom Accessibility: Yes Home Equipment: [place] - 4 wheels, [place] - single point Additional Comments: uses rollator the most at home  Functional History: Prior Function Level of Independence: Independent with assistive device(s) Comments: uses Rollator Functional Status:  Mobility: Bed Mobility Overal bed mobility: Needs Assistance Bed Mobility: Sit to Supine Rolling: Supervision Supine to sit: Supervision Sit to supine: Min assist General bed mobility comments: HOB elevated, used bed rail Transfers Overall transfer level: Needs assistance Equipment used: 1 person hand held assist Transfers: Sit to/from Stand Sit to Stand: Min assist General transfer comment: Min assist to power up and steady.  Ambulation/Gait Ambulation/Gait assistance: Min assist Ambulation Distance (Feet): 15 Feet(x2) Assistive device: Rolling walker (2 wheeled) Gait Pattern/deviations: Step-through pattern, Decreased stride length, Shuffle, Wide base of support General Gait Details: pt very unsteady, wide stance with minimal foot clearance, pt very rigid due to fear. pt with h/o 5  falls in last month Gait velocity: slow Gait velocity interpretation: Below normal speed for age/gender    ADL: ADL Overall ADL's : Needs assistance/impaired Eating/Feeding: Minimal assistance, Sitting Eating/Feeding Details (indicate cue type and reason): max cues and anchoring techniques to attend to L side of tray Grooming: Min guard, Standing Grooming Details (indicate cue type and reason): max cues to locate hot water faucet and sequence to turn on water Upper Body Bathing: Minimal assistance, Sitting Lower Body Bathing: Min guard, Sit to/from stand Upper Body Dressing : Minimal assistance, Sitting Lower Body Dressing: Min guard, Sit to/from stand Toilet Transfer: Ambulation, RW, Cueing for sequencing, Cueing for safety, Moderate assistance Toilet Transfer Details (indicate cue type and reason): cues and physical assistance to navigate as unable to attend to or visualize L side of environment Functional mobility during ADLs: Minimal assistance, Rolling walker General ADL Comments: Significant assistance to navigate due to running into items on the L. Unable to read her cell phone and only sees R side of screen.   Cognition: Cognition Overall Cognitive Status: Impaired/Different from baseline Orientation Level: Oriented X4(with confusion) Cognition Arousal/Alertness: Awake/alert Behavior During Therapy: WFL for tasks assessed/performed Overall Cognitive Status: Impaired/Different from baseline Area of Impairment: Attention, Following commands, Safety/judgement, Awareness, Problem solving, Memory Current Attention Level: Selective Memory: Decreased short-term memory Following Commands: Follows one step commands with increased time, Follows multi-step commands inconsistently Safety/Judgement: Decreased awareness of safety, Decreased awareness of deficits(L sided neglect) Awareness: Emergent Problem Solving: Slow processing, Difficulty sequencing, Requires verbal cues, Requires  tactile cues General Comments: pt with L hemiopnopsia, unaware of L sided neglect.  Blood pressure 104/83, pulse 79, temperature 98.2 F (36.8 C), temperature source Oral, resp. rate 17, height 5\' 7"  (1.702 m), weight 77.6 kg (171 lb 1.2 oz), SpO2 95 %. Physical Exam  Constitutional: She appears well-developed.  HENT:  Head: Normocephalic.  Eyes: Pupils are equal, round, and reactive to light.  Neck: Normal range of motion.  Cardiovascular: Normal rate.  Respiratory: Effort normal.  GI: Soft.  Neurological:  Pt with left HH. ?decreased [place] RUE and RLE> LUE/LLE although inconsistent with participation.   Psychiatric:  Irritable and impulsive    Results for orders placed or performed during the hospital encounter of 06/14/17 (from the past 24 hour(s))  CBC     Status: Abnormal   Collection Time: 06/17/17  5:27 AM  Result Value Ref Range   WBC 7.3 4.0 - 10.5 K/uL   RBC 4.55 3.87 - 5.11 MIL/uL   Hemoglobin 10.7 (L) 12.0 - 15.0 g/dL   HCT 36.0  36.0 - 46.0 %   MCV 79.1 78.0 - 100.0 fL   MCH 23.5 (L) 26.0 - 34.0 pg   MCHC 29.7 (L) 30.0 - 36.0 g/dL   RDW 18.0 (H) 11.5 - 15.5 %   Platelets 249 150 - 400 K/uL  Glucose, capillary     Status: Abnormal   Collection Time: 06/17/17  8:03 AM  Result Value Ref Range   Glucose-Capillary 152 (H) 65 - 99 mg/dL  Glucose, capillary     Status: Abnormal   Collection Time: 06/17/17 11:48 AM  Result Value Ref Range   Glucose-Capillary 158 (H) 65 - 99 mg/dL  Glucose, capillary     Status: Abnormal   Collection Time: 06/17/17  4:44 PM  Result Value Ref Range   Glucose-Capillary 250 (H) 65 - 99 mg/dL  Glucose, capillary     Status: Abnormal   Collection Time: 06/17/17  9:59 PM  Result Value Ref Range   Glucose-Capillary 190 (H) 65 - 99 mg/dL   No results found.  Assessment/Plan: Diagnosis: Right PCA/MCA infarct 1. Does the need for close, 24 hr/day medical supervision in concert with the patient's rehab needs make it unreasonable for this  patient to be served in a less intensive setting? Potentially 2. Co-Morbidities requiring supervision/potential complications: afib, DM, htn, RA 3. Due to bladder management, bowel management, safety, skin/wound care, disease management, medication administration and patient education, does the patient require 24 hr/day rehab nursing? Yes and Potentially 4. Does the patient require coordinated care of a physician, rehab nurse, PT (1-2 hrs/day, 5 days/week) and OT (1-2 hrs/day, 5 days/week), potential SLP as well, to address physical and functional deficits in the context of the above medical diagnosis(es)? Yes and Potentially Addressing deficits in the following areas: balance, endurance, locomotion, strength, transferring, bowel/bladder control, bathing, dressing, feeding, grooming, toileting and psychosocial support, cognition 5. Can the patient actively participate in an intensive therapy program of at least 3 hrs of therapy per day at least 5 days per week? Yes and Potentially 6. The potential for patient to make measurable gains while on inpatient rehab is good and fair 7. Anticipated functional outcomes upon discharge from inpatient rehab are modified independent and supervision  with PT, modified independent and supervision with OT, modified independent with SLP. 8. Estimated rehab length of stay to reach the above functional goals is: potentially 7-10 days 9. Anticipated D/C setting: Home 10. Anticipated post D/C treatments: HH therapy and Outpatient therapy 11. Overall Rehab/Functional Prognosis: good  RECOMMENDATIONS: This patient's condition is appropriate for continued rehabilitative care in the following setting: potentially CIR Patient has agreed to participate in recommended program. No and Potentially Note that insurance prior authorization may be required for reimbursement for recommended care.  Comment: Pt insists that she's going home. Husband lives with her and can provide  assistance. She could benefit from a brief stay on inpatient rehab to address visual-spatial deficits, balance, safety. Rehab Admissions Coordinator to follow up.  Thanks,  Meredith Staggers, MD, Mellody Drown    Meredith Staggers, MD 06/18/2017

## 2017-06-19 ENCOUNTER — Encounter (HOSPITAL_COMMUNITY): Payer: Self-pay

## 2017-06-19 ENCOUNTER — Inpatient Hospital Stay (HOSPITAL_COMMUNITY)
Admission: RE | Admit: 2017-06-19 | Discharge: 2017-07-05 | DRG: 057 | Disposition: A | Discharge status: Skilled Nursing Facility | Payer: Medicare Other | Source: Intra-hospital | Attending: Physical Medicine & Rehabilitation | Admitting: Physical Medicine & Rehabilitation

## 2017-06-19 ENCOUNTER — Encounter (HOSPITAL_COMMUNITY): Payer: Self-pay | Admitting: Physical Medicine and Rehabilitation

## 2017-06-19 DIAGNOSIS — E785 Hyperlipidemia, unspecified: Secondary | ICD-10-CM | POA: Diagnosis present

## 2017-06-19 DIAGNOSIS — R4189 Other symptoms and signs involving cognitive functions and awareness: Secondary | ICD-10-CM | POA: Diagnosis present

## 2017-06-19 DIAGNOSIS — R269 Unspecified abnormalities of gait and mobility: Secondary | ICD-10-CM | POA: Diagnosis not present

## 2017-06-19 DIAGNOSIS — I481 Persistent atrial fibrillation: Secondary | ICD-10-CM | POA: Diagnosis present

## 2017-06-19 DIAGNOSIS — Z7984 Long term (current) use of oral hypoglycemic drugs: Secondary | ICD-10-CM

## 2017-06-19 DIAGNOSIS — Z96641 Presence of right artificial hip joint: Secondary | ICD-10-CM | POA: Diagnosis present

## 2017-06-19 DIAGNOSIS — I69313 Psychomotor deficit following cerebral infarction: Secondary | ICD-10-CM | POA: Diagnosis not present

## 2017-06-19 DIAGNOSIS — G8929 Other chronic pain: Secondary | ICD-10-CM | POA: Diagnosis present

## 2017-06-19 DIAGNOSIS — Z7901 Long term (current) use of anticoagulants: Secondary | ICD-10-CM | POA: Diagnosis not present

## 2017-06-19 DIAGNOSIS — E876 Hypokalemia: Secondary | ICD-10-CM | POA: Diagnosis not present

## 2017-06-19 DIAGNOSIS — E041 Nontoxic single thyroid nodule: Secondary | ICD-10-CM | POA: Diagnosis present

## 2017-06-19 DIAGNOSIS — I639 Cerebral infarction, unspecified: Secondary | ICD-10-CM | POA: Diagnosis not present

## 2017-06-19 DIAGNOSIS — Z8249 Family history of ischemic heart disease and other diseases of the circulatory system: Secondary | ICD-10-CM | POA: Diagnosis not present

## 2017-06-19 DIAGNOSIS — I480 Paroxysmal atrial fibrillation: Secondary | ICD-10-CM | POA: Diagnosis not present

## 2017-06-19 DIAGNOSIS — E119 Type 2 diabetes mellitus without complications: Secondary | ICD-10-CM | POA: Diagnosis present

## 2017-06-19 DIAGNOSIS — I69312 Visuospatial deficit and spatial neglect following cerebral infarction: Secondary | ICD-10-CM | POA: Diagnosis not present

## 2017-06-19 DIAGNOSIS — D500 Iron deficiency anemia secondary to blood loss (chronic): Secondary | ICD-10-CM | POA: Diagnosis present

## 2017-06-19 DIAGNOSIS — R41841 Cognitive communication deficit: Secondary | ICD-10-CM | POA: Diagnosis not present

## 2017-06-19 DIAGNOSIS — R414 Neurologic neglect syndrome: Secondary | ICD-10-CM | POA: Diagnosis not present

## 2017-06-19 DIAGNOSIS — M6281 Muscle weakness (generalized): Secondary | ICD-10-CM | POA: Diagnosis not present

## 2017-06-19 DIAGNOSIS — G464 Cerebellar stroke syndrome: Secondary | ICD-10-CM | POA: Diagnosis not present

## 2017-06-19 DIAGNOSIS — M069 Rheumatoid arthritis, unspecified: Secondary | ICD-10-CM | POA: Diagnosis present

## 2017-06-19 DIAGNOSIS — E1169 Type 2 diabetes mellitus with other specified complication: Secondary | ICD-10-CM

## 2017-06-19 DIAGNOSIS — F329 Major depressive disorder, single episode, unspecified: Secondary | ICD-10-CM | POA: Diagnosis not present

## 2017-06-19 DIAGNOSIS — I5032 Chronic diastolic (congestive) heart failure: Secondary | ICD-10-CM | POA: Diagnosis present

## 2017-06-19 DIAGNOSIS — I130 Hypertensive heart and chronic kidney disease with heart failure and stage 1 through stage 4 chronic kidney disease, or unspecified chronic kidney disease: Secondary | ICD-10-CM | POA: Diagnosis not present

## 2017-06-19 DIAGNOSIS — Z7952 Long term (current) use of systemic steroids: Secondary | ICD-10-CM | POA: Diagnosis not present

## 2017-06-19 DIAGNOSIS — M5430 Sciatica, unspecified side: Secondary | ICD-10-CM | POA: Diagnosis present

## 2017-06-19 DIAGNOSIS — I110 Hypertensive heart disease with heart failure: Secondary | ICD-10-CM | POA: Diagnosis present

## 2017-06-19 DIAGNOSIS — Z823 Family history of stroke: Secondary | ICD-10-CM

## 2017-06-19 DIAGNOSIS — M48061 Spinal stenosis, lumbar region without neurogenic claudication: Secondary | ICD-10-CM | POA: Diagnosis present

## 2017-06-19 DIAGNOSIS — I69398 Other sequelae of cerebral infarction: Secondary | ICD-10-CM | POA: Diagnosis not present

## 2017-06-19 DIAGNOSIS — I4891 Unspecified atrial fibrillation: Secondary | ICD-10-CM | POA: Diagnosis present

## 2017-06-19 DIAGNOSIS — K219 Gastro-esophageal reflux disease without esophagitis: Secondary | ICD-10-CM | POA: Diagnosis present

## 2017-06-19 DIAGNOSIS — R2681 Unsteadiness on feet: Secondary | ICD-10-CM | POA: Diagnosis not present

## 2017-06-19 DIAGNOSIS — R278 Other lack of coordination: Secondary | ICD-10-CM | POA: Diagnosis not present

## 2017-06-19 DIAGNOSIS — M1610 Unilateral primary osteoarthritis, unspecified hip: Secondary | ICD-10-CM | POA: Diagnosis not present

## 2017-06-19 DIAGNOSIS — F419 Anxiety disorder, unspecified: Secondary | ICD-10-CM | POA: Diagnosis not present

## 2017-06-19 DIAGNOSIS — G8190 Hemiplegia, unspecified affecting unspecified side: Secondary | ICD-10-CM | POA: Diagnosis not present

## 2017-06-19 DIAGNOSIS — I340 Nonrheumatic mitral (valve) insufficiency: Secondary | ICD-10-CM | POA: Diagnosis not present

## 2017-06-19 DIAGNOSIS — E559 Vitamin D deficiency, unspecified: Secondary | ICD-10-CM | POA: Diagnosis not present

## 2017-06-19 DIAGNOSIS — M25461 Effusion, right knee: Secondary | ICD-10-CM | POA: Diagnosis not present

## 2017-06-19 DIAGNOSIS — D509 Iron deficiency anemia, unspecified: Secondary | ICD-10-CM | POA: Diagnosis not present

## 2017-06-19 DIAGNOSIS — I739 Peripheral vascular disease, unspecified: Secondary | ICD-10-CM | POA: Diagnosis not present

## 2017-06-19 DIAGNOSIS — I482 Chronic atrial fibrillation, unspecified: Secondary | ICD-10-CM | POA: Diagnosis present

## 2017-06-19 DIAGNOSIS — K5900 Constipation, unspecified: Secondary | ICD-10-CM | POA: Diagnosis not present

## 2017-06-19 DIAGNOSIS — H53462 Homonymous bilateral field defects, left side: Secondary | ICD-10-CM | POA: Diagnosis not present

## 2017-06-19 DIAGNOSIS — M25561 Pain in right knee: Secondary | ICD-10-CM | POA: Diagnosis present

## 2017-06-19 DIAGNOSIS — N183 Chronic kidney disease, stage 3 (moderate): Secondary | ICD-10-CM | POA: Diagnosis not present

## 2017-06-19 DIAGNOSIS — M4807 Spinal stenosis, lumbosacral region: Secondary | ICD-10-CM | POA: Diagnosis not present

## 2017-06-19 LAB — BASIC METABOLIC PANEL
Anion gap: 12 (ref 5–15)
BUN: 18 mg/dL (ref 6–20)
CO2: 21 mmol/L — AB (ref 22–32)
Calcium: 8.9 mg/dL (ref 8.9–10.3)
Chloride: 109 mmol/L (ref 101–111)
Creatinine, Ser: 0.79 mg/dL (ref 0.44–1.00)
GFR calc Af Amer: >60 mL/min (ref >60)
GFR calc non Af Amer: >60 mL/min (ref >60)
GLUCOSE: 241 mg/dL — AB (ref 65–99)
POTASSIUM: 3.6 mmol/L (ref 3.5–5.1)
Sodium: 142 mmol/L (ref 135–145)

## 2017-06-19 LAB — CBC
HEMATOCRIT: 37.4 % (ref 36.0–46.0)
Hemoglobin: 11.1 g/dL — ABNORMAL LOW (ref 12.0–15.0)
MCH: 23.3 pg — ABNORMAL LOW (ref 26.0–34.0)
MCHC: 29.7 g/dL — AB (ref 30.0–36.0)
MCV: 78.4 fL (ref 78.0–100.0)
Platelets: 255 10*3/uL (ref 150–400)
RBC: 4.77 MIL/uL (ref 3.87–5.11)
RDW: 17.5 % — AB (ref 11.5–15.5)
WBC: 9.3 10*3/uL (ref 4.0–10.5)

## 2017-06-19 LAB — GLUCOSE, CAPILLARY
Glucose-Capillary: 149 mg/dL — ABNORMAL HIGH (ref 65–99)
Glucose-Capillary: 185 mg/dL — ABNORMAL HIGH (ref 65–99)
Glucose-Capillary: 174 mg/dL — ABNORMAL HIGH (ref 65–99)

## 2017-06-19 MED ORDER — BETHANECHOL CHLORIDE 10 MG PO TABS
5.0000 mg | ORAL_TABLET | Freq: Three times a day (TID) | ORAL | Status: DC
  Administered 2017-06-19 – 2017-06-21 (×5): 5 mg via ORAL
  Filled 2017-06-19 (×5): qty 1

## 2017-06-19 MED ORDER — ROSUVASTATIN CALCIUM 40 MG PO TABS
40.0000 mg | ORAL_TABLET | Freq: Every evening | ORAL | Status: DC
  Administered 2017-06-20 – 2017-07-04 (×15): 40 mg via ORAL
  Filled 2017-06-19 (×15): qty 1

## 2017-06-19 MED ORDER — GUAIFENESIN-DM 100-10 MG/5ML PO SYRP: 5.0000 mL | ORAL_SOLUTION | Freq: Four times a day (QID) | ORAL | Status: DC | PRN

## 2017-06-19 MED ORDER — SENNOSIDES-DOCUSATE SODIUM 8.6-50 MG PO TABS: 1.0000 | ORAL_TABLET | Freq: Every evening | ORAL | Status: DC | PRN

## 2017-06-19 MED ORDER — ROSUVASTATIN CALCIUM 40 MG PO TABS: 40.0000 mg | ORAL_TABLET | Freq: Every evening | ORAL | Status: DC

## 2017-06-19 MED ORDER — FERROUS GLUCONATE 324 (38 FE) MG PO TABS
324.0000 mg | ORAL_TABLET | Freq: Two times a day (BID) | ORAL | Status: DC
  Administered 2017-06-20 – 2017-07-05 (×30): 324 mg via ORAL
  Filled 2017-06-19 (×31): qty 1

## 2017-06-19 MED ORDER — INSULIN ASPART 100 UNIT/ML ~~LOC~~ SOLN
0.0000 [IU] | Freq: Three times a day (TID) | SUBCUTANEOUS | Status: DC
  Administered 2017-06-20 (×3): 2 [IU] via SUBCUTANEOUS
  Administered 2017-06-21 (×2): 5 [IU] via SUBCUTANEOUS
  Administered 2017-06-22 (×3): 2 [IU] via SUBCUTANEOUS
  Administered 2017-06-23: 1 [IU] via SUBCUTANEOUS
  Administered 2017-06-23 – 2017-06-24 (×3): 2 [IU] via SUBCUTANEOUS
  Administered 2017-06-24: 3 [IU] via SUBCUTANEOUS
  Administered 2017-06-24: 1 [IU] via SUBCUTANEOUS
  Administered 2017-06-25 (×2): 2 [IU] via SUBCUTANEOUS
  Administered 2017-06-26: 3 [IU] via SUBCUTANEOUS
  Administered 2017-06-26: 1 [IU] via SUBCUTANEOUS
  Administered 2017-06-27: 2 [IU] via SUBCUTANEOUS
  Administered 2017-06-27: 3 [IU] via SUBCUTANEOUS
  Administered 2017-06-27: 1 [IU] via SUBCUTANEOUS
  Administered 2017-06-28: 2 [IU] via SUBCUTANEOUS
  Administered 2017-06-28: 3 [IU] via SUBCUTANEOUS
  Administered 2017-06-28: 1 [IU] via SUBCUTANEOUS
  Administered 2017-06-29 (×2): 3 [IU] via SUBCUTANEOUS
  Administered 2017-06-30: 1 [IU] via SUBCUTANEOUS
  Administered 2017-06-30: 3 [IU] via SUBCUTANEOUS
  Administered 2017-06-30: 2 [IU] via SUBCUTANEOUS
  Administered 2017-07-01: 1 [IU] via SUBCUTANEOUS
  Administered 2017-07-01 (×2): 2 [IU] via SUBCUTANEOUS
  Administered 2017-07-02 (×2): 3 [IU] via SUBCUTANEOUS
  Administered 2017-07-03 – 2017-07-04 (×3): 2 [IU] via SUBCUTANEOUS
  Administered 2017-07-04: 5 [IU] via SUBCUTANEOUS
  Administered 2017-07-04 – 2017-07-05 (×2): 1 [IU] via SUBCUTANEOUS

## 2017-06-19 MED ORDER — DILTIAZEM HCL ER COATED BEADS 180 MG PO CP24
360.0000 mg | ORAL_CAPSULE | Freq: Every day | ORAL | Status: DC
  Administered 2017-06-20 – 2017-07-05 (×16): 360 mg via ORAL
  Filled 2017-06-19 (×16): qty 2

## 2017-06-19 MED ORDER — FLEET ENEMA 7-19 GM/118ML RE ENEM
1.0000 | ENEMA | Freq: Once | RECTAL | Status: DC | PRN
  Filled 2017-06-19: qty 1

## 2017-06-19 MED ORDER — BACLOFEN 5 MG HALF TABLET
5.0000 mg | ORAL_TABLET | Freq: Three times a day (TID) | ORAL | Status: DC
  Administered 2017-06-19 – 2017-07-04 (×44): 5 mg via ORAL
  Filled 2017-06-19 (×45): qty 1

## 2017-06-19 MED ORDER — PROCHLORPERAZINE 25 MG RE SUPP: 12.5000 mg | Freq: Four times a day (QID) | RECTAL | Status: DC | PRN

## 2017-06-19 MED ORDER — PROCHLORPERAZINE EDISYLATE 5 MG/ML IJ SOLN: 5.0000 mg | Freq: Four times a day (QID) | INTRAMUSCULAR | Status: DC | PRN

## 2017-06-19 MED ORDER — NITROGLYCERIN 0.4 MG SL SUBL
0.4000 mg | SUBLINGUAL_TABLET | SUBLINGUAL | Status: DC | PRN
  Administered 2017-06-24: 0.4 mg via SUBLINGUAL
  Filled 2017-06-19: qty 1

## 2017-06-19 MED ORDER — BUPROPION HCL ER (SR) 150 MG PO TB12
150.0000 mg | ORAL_TABLET | Freq: Two times a day (BID) | ORAL | Status: DC
  Administered 2017-06-19 – 2017-07-05 (×32): 150 mg via ORAL
  Filled 2017-06-19 (×32): qty 1

## 2017-06-19 MED ORDER — BETHANECHOL CHLORIDE 5 MG PO TABS: 5.0000 mg | ORAL_TABLET | Freq: Three times a day (TID) | ORAL | Status: DC

## 2017-06-19 MED ORDER — ENOXAPARIN SODIUM 40 MG/0.4ML ~~LOC~~ SOLN
40.0000 mg | SUBCUTANEOUS | Status: DC
  Administered 2017-06-19 – 2017-06-21 (×3): 40 mg via SUBCUTANEOUS
  Filled 2017-06-19 (×3): qty 0.4

## 2017-06-19 MED ORDER — PANTOPRAZOLE SODIUM 40 MG PO TBEC
40.0000 mg | DELAYED_RELEASE_TABLET | Freq: Every day | ORAL | Status: DC
  Administered 2017-06-20 – 2017-07-05 (×16): 40 mg via ORAL
  Filled 2017-06-19 (×16): qty 1

## 2017-06-19 MED ORDER — ASPIRIN EC 325 MG PO TBEC
325.0000 mg | DELAYED_RELEASE_TABLET | Freq: Every day | ORAL | Status: DC
  Administered 2017-06-20 – 2017-06-22 (×3): 325 mg via ORAL
  Filled 2017-06-19 (×3): qty 1

## 2017-06-19 MED ORDER — LEFLUNOMIDE 20 MG PO TABS
20.0000 mg | ORAL_TABLET | Freq: Every day | ORAL | Status: DC
  Administered 2017-06-20 – 2017-07-05 (×16): 20 mg via ORAL
  Filled 2017-06-19 (×16): qty 1

## 2017-06-19 MED ORDER — DIPHENHYDRAMINE HCL 12.5 MG/5ML PO ELIX: 12.5000 mg | ORAL_SOLUTION | Freq: Four times a day (QID) | ORAL | Status: DC | PRN

## 2017-06-19 MED ORDER — INSULIN ASPART 100 UNIT/ML ~~LOC~~ SOLN: 0.0000 [IU] | Freq: Three times a day (TID) | SUBCUTANEOUS | 11 refills | Status: DC

## 2017-06-19 MED ORDER — POLYETHYLENE GLYCOL 3350 17 G PO PACK: 17.0000 g | PACK | Freq: Every day | ORAL | Status: DC | PRN

## 2017-06-19 MED ORDER — BACLOFEN 5 MG PO TABS: 5.0000 mg | ORAL_TABLET | Freq: Three times a day (TID) | ORAL | 0 refills | Status: DC

## 2017-06-19 MED ORDER — PREDNISONE 5 MG PO TABS
5.0000 mg | ORAL_TABLET | Freq: Every day | ORAL | Status: DC
  Administered 2017-06-20 – 2017-07-05 (×16): 5 mg via ORAL
  Filled 2017-06-19 (×17): qty 1

## 2017-06-19 MED ORDER — BISACODYL 10 MG RE SUPP: 10.0000 mg | Freq: Every day | RECTAL | Status: DC | PRN

## 2017-06-19 MED ORDER — DICLOFENAC SODIUM 1 % TD GEL
2.0000 g | Freq: Every evening | TRANSDERMAL | Status: DC | PRN
  Filled 2017-06-19: qty 100

## 2017-06-19 MED ORDER — OCUVITE-LUTEIN PO CAPS
1.0000 | ORAL_CAPSULE | Freq: Two times a day (BID) | ORAL | Status: DC
  Administered 2017-06-19 – 2017-06-27 (×16): 1 via ORAL
  Filled 2017-06-19 (×17): qty 1

## 2017-06-19 MED ORDER — PROCHLORPERAZINE MALEATE 5 MG PO TABS: 5.0000 mg | ORAL_TABLET | Freq: Four times a day (QID) | ORAL | Status: DC | PRN

## 2017-06-19 MED ORDER — ACETAMINOPHEN 325 MG PO TABS: 650.0000 mg | ORAL_TABLET | ORAL | Status: DC | PRN

## 2017-06-19 MED ORDER — ASPIRIN 325 MG PO TABS: 325.0000 mg | ORAL_TABLET | Freq: Every day | ORAL | Status: DC

## 2017-06-19 MED ORDER — GLIMEPIRIDE 2 MG PO TABS
2.0000 mg | ORAL_TABLET | Freq: Every day | ORAL | Status: DC
  Administered 2017-06-20 – 2017-07-05 (×16): 2 mg via ORAL
  Filled 2017-06-19 (×16): qty 1

## 2017-06-19 MED ORDER — ALUM & MAG HYDROXIDE-SIMETH 200-200-20 MG/5ML PO SUSP: 30.0000 mL | ORAL | Status: DC | PRN

## 2017-06-19 MED ORDER — TRAZODONE HCL 50 MG PO TABS
25.0000 mg | ORAL_TABLET | Freq: Every evening | ORAL | Status: DC | PRN
Start: 2017-06-19 — End: 2017-06-23
  Administered 2017-06-21 – 2017-06-22 (×3): 50 mg via ORAL
  Filled 2017-06-19 (×3): qty 1

## 2017-06-19 MED ORDER — VITAMIN B-12 1000 MCG PO TABS
1000.0000 ug | ORAL_TABLET | Freq: Every evening | ORAL | Status: DC
  Administered 2017-06-19 – 2017-07-04 (×16): 1000 ug via ORAL
  Filled 2017-06-19 (×15): qty 1

## 2017-06-19 MED ORDER — METOPROLOL TARTRATE 100 MG PO TABS: 100.0000 mg | ORAL_TABLET | Freq: Two times a day (BID) | ORAL | Status: DC

## 2017-06-19 MED ORDER — METOPROLOL TARTRATE 50 MG PO TABS
100.0000 mg | ORAL_TABLET | Freq: Two times a day (BID) | ORAL | Status: DC
  Administered 2017-06-19 – 2017-07-05 (×32): 100 mg via ORAL
  Filled 2017-06-19 (×33): qty 2

## 2017-06-19 MED ORDER — ACETAMINOPHEN 325 MG PO TABS
325.0000 mg | ORAL_TABLET | ORAL | Status: DC | PRN
  Administered 2017-06-20 – 2017-06-27 (×6): 650 mg via ORAL
  Administered 2017-06-28: 325 mg via ORAL
  Administered 2017-07-01 – 2017-07-02 (×2): 650 mg via ORAL
  Filled 2017-06-19 (×10): qty 2

## 2017-06-19 NOTE — H&P (Signed)
Physical Medicine and Rehabilitation Admission H&P    Chief Complaint  Patient presents with  . Deficits due to stroke.     HPI:  [name] is a 72 y.o. female with history of Afib, diabetes and RA who presented on 06/14/17 with headache and blurred vision after having stopped xarelto on 06/12/17 for nerve block.  MRI revealed acute infarcts of the posterior right middle cerebral artery territory and the right posterior cerebral artery territory with petechial hemorrhage in the distribution of the right PCA infarct. Dr. Erlinda Hong felt that stroke embolic due to known A Fib--on  39m ECASA daily up due to size of stroke. To resume Xarelto in 7 days. Patient with ongoing left field cut with deficits in visual-spatial awareness and coordination, left inattention, significant cognitive deficits (MoCA 10/30) with lack of awareness of deficits and unsteady gait.  CIR was recommended due to functional deficits.     Review of Systems  Constitutional: Positive for diaphoresis. Negative for fever.  HENT: Positive for hearing loss. Negative for tinnitus.   Eyes: Negative for blurred vision.  Respiratory: Negative for cough and sputum production.   Cardiovascular: Negative for chest pain and palpitations.  Gastrointestinal: Negative for constipation, heartburn and nausea.  Genitourinary: Negative for dysuria and urgency.  Musculoskeletal: Positive for back pain, falls (multiple in last 2 months--due to right knee instability) and myalgias.  Skin: Negative for rash.  Neurological: Positive for speech change and focal weakness. Negative for dizziness and headaches.  Psychiatric/Behavioral: The patient is nervous/anxious.      Past Medical History:  Diagnosis Date  . Actinic keratosis   . Anemia    hx of  . Anemia of chronic renal failure, stage 3 (moderate) ([facility] 07/17/2015  . Anticoagulant causing adverse effect in therapeutic use 07/17/2015  . Atrial fibrillation ([facility]    a. persistent, she  remains on Xarelto  . Carpal tunnel syndrome   . Chronic diastolic (congestive) heart failure ([facility]    a. 04/2016: echo showing a preserved EF of 55-60% with mild MR. LA and RA midly dilated.   . Edema    left leg at ankle resolved now  . Gastroesophageal reflux disease   . Hepatitis 1970   not sure what kind  . Hiatal hernia   . Hyperlipidemia   . Hypertension   . Iron deficiency anemia due to chronic blood loss 07/17/2015  . Jaundice    age 72 . Peripheral vascular disease ([facility] yrs ago   DVT left lower leg questionale told by 2 drs i had no clot, 1 md said i did  . Rheumatoid arthritis(714.0)   . Type II diabetes mellitus ([facility]    type2    Past Surgical History:  Procedure Laterality Date  . CATARACT EXTRACTION Bilateral   . COLONOSCOPY WITH PROPOFOL N/A 02/20/2015   Procedure: COLONOSCOPY WITH PROPOFOL;  Surgeon: PCarol Ada MD;  Location: [facility];  Service: Endoscopy;  Laterality: N/A;  . ENTEROSCOPY N/A 03/23/2016   Procedure: ENTEROSCOPY;  Surgeon: PCarol Ada MD;  Location: [facility];  Service: Endoscopy;  Laterality: N/A;  . ENTEROSCOPY N/A 06/17/2016   Procedure: ENTEROSCOPY;  Surgeon: PCarol Ada MD;  Location: [facility];  Service: Endoscopy;  Laterality: N/A;  . ESOPHAGOGASTRODUODENOSCOPY (EGD) WITH PROPOFOL N/A 02/20/2015   Procedure: ESOPHAGOGASTRODUODENOSCOPY (EGD) WITH PROPOFOL;  Surgeon: PCarol Ada MD;  Location: [facility];  Service: Endoscopy;  Laterality: N/A;  . GIVENS CAPSULE STUDY N/A 03/21/2016   Procedure: GIVENS CAPSULE STUDY;  Surgeon:  Carol Ada, MD;  Location: [facility];  Service: Endoscopy;  Laterality: N/A;  . HAND SURGERY  1995 and 1996   artificial joints both hands  . JOINT REPLACEMENT    . LUMBAR LAMINECTOMY/DECOMPRESSION MICRODISCECTOMY N/A 02/10/2016   Procedure: MICROLUMBAR DECOMPRESSION L4-L5 AND L3- L4, AND EXCISION OF SYNOVIAL CYST L4-L5;  Surgeon: Susa Day, MD;  Location: [facility];  Service: Orthopedics;   Laterality: N/A;  . TOTAL HIP ARTHROPLASTY Left 2009  . TOTAL HIP ARTHROPLASTY Right 04/03/2013   Procedure: RIGHT TOTAL HIP ARTHROPLASTY ANTERIOR APPROACH;  Surgeon: Gearlean Alf, MD;  Location: [facility];  Service: Orthopedics;  Laterality: Right;    Family History  Problem Relation Age of Onset  . Stroke Father   . Heart failure Sister   . Hypertension Sister   . Heart attack Neg Hx     Social History:  Lives with a boyfriend (83?) who does most of the home management. She uses rollator in the home and cane out of home. She  reports that  has never smoked.  She has never used smokeless tobacco. She reports that she does not drink alcohol or use drugs.    Allergies: No Known Allergies    Medications Prior to Admission  Medication Sig Dispense Refill  . Abatacept (ORENCIA CLICKJECT) 469 MG/ML SOAJ Inject 125 mg into the skin every Thursday.     [name] buPROPion (WELLBUTRIN SR) 150 MG 12 hr tablet Take 150 mg by mouth 2 (two) times daily.      . Cholecalciferol (VITAMIN D3) 1000 units CAPS Take 1,000 Units by mouth every evening.     . Cyanocobalamin (VITAMIN B 12 PO) Take 1,000 mcg by mouth every evening.     . diltiazem (CARDIZEM CD) 360 MG 24 hr capsule TAKE 1 CAPSULE (360 MG TOTAL) BY MOUTH DAILY. 90 capsule 3  . esomeprazole (NEXIUM) 40 MG capsule Take 40 mg by mouth every morning.     . ferrous gluconate (FERGON) 324 MG tablet Take 1 tablet (324 mg total) by mouth 2 (two) times daily with a meal. 60 tablet 3  . furosemide (LASIX) 20 MG tablet Take 2 tablets (40 mg total) by mouth daily. Take 2 tablets by mouth daily. TAKE AN ADDITIONAL 1 TAB AS NEEDED 90 tablet 5  . gabapentin (NEURONTIN) 300 MG capsule Take 300 mg by mouth 2 (two) times daily as needed for pain.  1  . glimepiride (AMARYL) 2 MG tablet Take 2 mg by mouth daily.     [name] leflunomide (ARAVA) 20 MG tablet Take 20 mg by mouth daily.    . metFORMIN (GLUCOPHAGE-XR) 500 MG 24 hr tablet Take 1,000 mg by mouth 2 (two) times  daily.    . metoprolol tartrate (LOPRESSOR) 50 MG tablet TAKE 2 TABLETS (100 MG TOTAL) BY MOUTH 2 (TWO) TIMES DAILY. 120 tablet 11  . Multiple Vitamins-Minerals (PRESERVISION AREDS 2) CAPS Take 1 capsule by mouth 2 (two) times daily.    . nitroGLYCERIN (NITROSTAT) 0.4 MG SL tablet Place 1 tablet (0.4 mg total) under the tongue every 5 (five) minutes as needed for chest pain (x 3 doses). 25 tablet 2  . potassium chloride (KLOR-CON 10) 10 MEQ tablet Take 2 tablets (20 mEq total) 2 (two) times daily by mouth. 360 tablet 3  . predniSONE (DELTASONE) 5 MG tablet Take 5 mg by mouth daily.    . rivaroxaban (XARELTO) 20 MG TABS tablet Take 20 mg by mouth daily with supper.    . rosuvastatin (  CRESTOR) 10 MG tablet Take 10 mg by mouth every evening.     [name] VOLTAREN 1 % GEL Apply 2 g topically at bedtime as needed for pain. Knees, calf  2  . HYDROcodone-acetaminophen (NORCO/VICODIN) 5-325 MG tablet Take 1 tablet by mouth every 6 (six) hours as needed for pain.  0    Drug Regimen Review  Drug regimen was reviewed and remains appropriate with no significant issues identified  Home: Home Living Family/patient expects to be discharged to:: Private residence Living Arrangements: Spouse/significant other Available Help at Discharge: Family Type of Home: House Home Access: Stairs to enter Technical brewer of Steps: 3 Entrance Stairs-Rails: Right, Left, Can reach both Home Layout: One level Bathroom Shower/Tub: Chiropodist: Handicapped height Bathroom Accessibility: Yes Home Equipment: Environmental consultant - 4 wheels, Cane - single point Additional Comments: uses rollator the most at home   Functional History: Prior Function Level of Independence: Independent with assistive device(s) Comments: uses Rollator  Functional Status:  Mobility: Bed Mobility Overal bed mobility: Needs Assistance Bed Mobility: Sit to Supine Rolling: Supervision Supine to sit: Supervision Sit to supine: Min  assist General bed mobility comments: HOB elevated, used bed rail Transfers Overall transfer level: Needs assistance Equipment used: 1 person hand held assist Transfers: Sit to/from Stand Sit to Stand: Min assist General transfer comment: Min assist to power up and steady.  Ambulation/Gait Ambulation/Gait assistance: Min assist Ambulation Distance (Feet): 15 Feet(x2) Assistive device: Rolling walker (2 wheeled) Gait Pattern/deviations: Step-through pattern, Decreased stride length, Shuffle, Wide base of support General Gait Details: pt very unsteady, wide stance with minimal foot clearance, pt very rigid due to fear. pt with h/o 5 falls in last month Gait velocity: slow Gait velocity interpretation: Below normal speed for age/gender    ADL: ADL Overall ADL's : Needs assistance/impaired Eating/Feeding: Minimal assistance, Sitting Eating/Feeding Details (indicate cue type and reason): max cues and anchoring techniques to attend to L side of tray Grooming: Min guard, Standing Grooming Details (indicate cue type and reason): max cues to locate hot water faucet and sequence to turn on water Upper Body Bathing: Minimal assistance, Sitting Lower Body Bathing: Min guard, Sit to/from stand Upper Body Dressing : Minimal assistance, Sitting Lower Body Dressing: Min guard, Sit to/from stand Toilet Transfer: Ambulation, RW, Cueing for sequencing, Cueing for safety, Moderate assistance Toilet Transfer Details (indicate cue type and reason): cues and physical assistance to navigate as unable to attend to or visualize L side of environment Functional mobility during ADLs: Minimal assistance, Rolling walker General ADL Comments: Significant assistance to navigate due to running into items on the L. Unable to read her cell phone and only sees R side of screen.   Cognition: Cognition Overall Cognitive Status: Impaired/Different from baseline Arousal/Alertness: Awake/alert Orientation Level:  Oriented X4 Attention: Sustained, Selective Sustained Attention: Impaired Sustained Attention Impairment: Verbal basic, Functional basic(refocusing, verbal cues in conversation and cognitive tasks) Selective Attention: Impaired Selective Attention Impairment: Verbal basic, Functional basic Memory: Impaired Memory Impairment: Decreased recall of new information, Storage deficit, Decreased short term memory(attention impacts) Decreased Short Term Memory: Verbal basic, Functional basic Awareness: Impaired Awareness Impairment: Intellectual impairment Problem Solving: Impaired Problem Solving Impairment: Functional basic(0/3 ways to make $13) Executive Function: Sequencing, Organizing, Initiating, Self Correcting, Self Monitoring Sequencing: Impaired Sequencing Impairment: Verbal basic, Functional basic Organizing: Impaired Organizing Impairment: Verbal basic, Functional basic Initiating: Impaired Initiating Impairment: Verbal basic, Functional basic(cues for task initiation and to respond to simple questions) Self Monitoring: Impaired Self Monitoring Impairment: Functional basic  Self Correcting: Impaired Self Correcting Impairment: Functional basic Behaviors: Poor frustration tolerance Safety/Judgment: Impaired Cognition Arousal/Alertness: Awake/alert Behavior During Therapy: WFL for tasks assessed/performed Overall Cognitive Status: Impaired/Different from baseline Area of Impairment: Attention, Following commands, Safety/judgement, Awareness, Problem solving, Memory Current Attention Level: Selective Memory: Decreased short-term memory Following Commands: Follows one step commands with increased time, Follows multi-step commands inconsistently Safety/Judgement: Decreased awareness of safety, Decreased awareness of deficits(L sided neglect) Awareness: Emergent Problem Solving: Slow processing, Difficulty sequencing, Requires verbal cues, Requires tactile cues General Comments: pt  with L hemiopnopsia, unaware of L sided neglect.   Blood pressure 110/80, pulse 87, temperature 98.2 F (36.8 C), temperature source Oral, resp. rate 17, height '5\' 7"'  (1.702 m), weight 77.6 kg (171 lb 1.2 oz), SpO2 99 %. Physical Exam  Nursing note and vitals reviewed. Constitutional: She is oriented to person, place, and time. She appears well-developed and well-nourished. No distress.  HENT:  Head: Normocephalic and atraumatic.  Eyes: Conjunctivae are normal. Pupils are equal, round, and reactive to light.  Neck: Normal range of motion. Neck supple.  Cardiovascular: Normal rate and regular rhythm.  Respiratory: Effort normal and breath sounds normal. No stridor. No respiratory distress. She has no wheezes.  GI: Soft. Bowel sounds are normal. She exhibits no distension. There is no tenderness.  Musculoskeletal: She exhibits no edema or tenderness.  Evidence of muscle wasting RLE. Bilateral hands with ulnar deviation and herbedon's  nodules.   Neurological: She is alert and oriented to person, place, and time.  Speech clear and tangential. Able to follow basic commands without difficult. Mild left sided weakness. Left visual-spatial deficits, ?left HH. And left intattention.   Skin: Skin is warm and dry. She is not diaphoretic.  Psychiatric: She has a normal mood and affect. Her speech is tangential. She expresses impulsivity. She is inattentive.    Results for orders placed or performed during the hospital encounter of 06/14/17 (from the past 48 hour(s))  Glucose, capillary     Status: Abnormal   Collection Time: 06/17/17  4:44 PM  Result Value Ref Range   Glucose-Capillary 250 (H) 65 - 99 mg/dL  Glucose, capillary     Status: Abnormal   Collection Time: 06/17/17  9:59 PM  Result Value Ref Range   Glucose-Capillary 190 (H) 65 - 99 mg/dL  Basic metabolic panel     Status: Abnormal   Collection Time: 06/18/17  3:50 AM  Result Value Ref Range   Sodium 140 135 - 145 mmol/L    Potassium 3.6 3.5 - 5.1 mmol/L   Chloride 110 101 - 111 mmol/L   CO2 20 (L) 22 - 32 mmol/L   Glucose, Bld 176 (H) 65 - 99 mg/dL   BUN 22 (H) 6 - 20 mg/dL   Creatinine, Ser 1.07 (H) 0.44 - 1.00 mg/dL   Calcium 9.2 8.9 - 10.3 mg/dL   GFR calc non Af Amer 51 (L) >60 mL/min   GFR calc Af Amer 59 (L) >60 mL/min    Comment: (NOTE) The eGFR has been calculated using the CKD EPI equation. This calculation has not been validated in all clinical situations. eGFR's persistently <60 mL/min signify possible Chronic Kidney Disease.    Anion gap 10 5 - 15    Comment: Performed at [facility] [address], [place], [place] [address]  CBC     Status: Abnormal   Collection Time: 06/18/17  3:50 AM  Result Value Ref Range   WBC 10.0 4.0 - 10.5 K/uL  RBC 4.68 3.87 - 5.11 MIL/uL   Hemoglobin 10.9 (L) 12.0 - 15.0 g/dL   HCT 36.7 36.0 - 46.0 %   MCV 78.4 78.0 - 100.0 fL   MCH 23.3 (L) 26.0 - 34.0 pg   MCHC 29.7 (L) 30.0 - 36.0 g/dL   RDW 17.5 (H) 11.5 - 15.5 %   Platelets 237 150 - 400 K/uL    Comment: Performed at [facility] [address] [place], [place] [address]  Glucose, capillary     Status: Abnormal   Collection Time: 06/18/17  7:54 AM  Result Value Ref Range   Glucose-Capillary 169 (H) 65 - 99 mg/dL  Glucose, capillary     Status: Abnormal   Collection Time: 06/18/17 12:23 PM  Result Value Ref Range   Glucose-Capillary 221 (H) 65 - 99 mg/dL  Glucose, capillary     Status: Abnormal   Collection Time: 06/18/17  5:47 PM  Result Value Ref Range   Glucose-Capillary 185 (H) 65 - 99 mg/dL  Glucose, capillary     Status: Abnormal   Collection Time: 06/18/17  9:26 PM  Result Value Ref Range   Glucose-Capillary 220 (H) 65 - 99 mg/dL   Comment 1 Notify RN    Comment 2 Document in Chart   Glucose, capillary     Status: Abnormal   Collection Time: 06/19/17  8:00 AM  Result Value Ref Range   Glucose-Capillary 149 (H) 65 - 99 mg/dL  Glucose, capillary     Status: Abnormal     Collection Time: 06/19/17 12:07 PM  Result Value Ref Range   Glucose-Capillary 185 (H) 65 - 99 mg/dL   No results found.     Medical Problem List and Plan: 1.  Functional and visual-spatial deficits secondary to right PCA/MCA infarct  -admit to inpatient rehab 2.  DVT Prophylaxis/Anticoagulation: Will start  Lovenox 40 mg daily.  3. Chronic LBP/Right knee pain/Pain Management: tylenol prn and local measures. Continue to hold Norco and gabapentin (used prn) 4. Mood: LCSW to follow for evaluation and support. Continue Wellbutrin bid 5. Neuropsych: This patient is not fully capable of making decisions on her own behalf. 6. Skin/Wound Care: routine pressure relief measures 7. Fluids/Electrolytes/Nutrition: Monitor I/O. Check lytes in am.  8. A fib: Monitor HR bid. Continue Metoprolol bid with Cardizem daily. .  9. RA: managed with Arava and low dose prednisone.   10. GERD: On Protonix.  11.Hgb A1C- 6.9: T2DM: Monitor BS ac/hs. Resume Amaryl as po intake is good--continue to hold metformin for now.  12. ARF: Encourage fluid intake. Will recheck lytes in am.   Post Admission Physician Evaluation: 1. Functional deficits secondary  to right PCA/MCA infarct. 2. Patient is admitted to receive collaborative, interdisciplinary care between the physiatrist, rehab nursing staff, and therapy team. 3. Patient's level of medical complexity and substantial therapy needs in context of that medical necessity cannot be provided at a lesser intensity of care such as a SNF. 4. Patient has experienced substantial functional loss from his/her baseline which was documented above under the "Functional History" and "Functional Status" headings.  Judging by the patient's diagnosis, physical exam, and functional history, the patient has potential for functional progress which will result in measurable gains while on inpatient rehab.  These gains will be of substantial and practical use upon discharge  in  facilitating mobility and self-care at the household level. 5. Physiatrist will provide 24 hour management of medical needs as well as oversight of the therapy  plan/treatment and provide guidance as appropriate regarding the interaction of the two. 6. The Preadmission Screening has been reviewed and patient status is unchanged unless otherwise stated above. 7. 24 hour rehab nursing will assist with bladder management, bowel management, safety, skin/wound care, disease management, medication administration, pain management and patient education  and help integrate therapy concepts, techniques,education, etc. 8. PT will assess and treat for/with: Lower extremity strength, range of motion, stamina, balance, functional mobility, safety, adaptive techniques and equipment, NMR, cognitive perceptual rx, family education.   Goals are: mod I to supervision . 9. OT will assess and treat for/with: ADL's, functional mobility, safety, upper extremity strength, adaptive techniques and equipment, NMR, visua-spatial rx, family ed.   Goals are: mod I to supervision. Therapy may proceed with showering this patient. 10. SLP will assess and treat for/with: cognition, communication, family ed.  Goals are: supervision to mod I. 11. Case Management and Social Worker will assess and treat for psychological issues and discharge planning. 12. Team conference will be held weekly to assess progress toward goals and to determine barriers to discharge. 13. Patient will receive at least 3 hours of therapy per day at least 5 days per week. 14. ELOS: 7-11 days       15. Prognosis:  excellent     Meredith Staggers, MD, [facility] 06/19/2017  Bary Leriche, PA-C 06/19/2017

## 2017-06-19 NOTE — Care Management Important Message (Signed)
Important Message  Patient Details  Name: [name] MRN: [id number] Date of Birth: 1945/08/27   Medicare Important Message Given:  Yes    Melinda Gwinner Montine Circle 06/19/2017, 1:23 PM

## 2017-06-19 NOTE — Progress Notes (Signed)
Inpatient Rehabilitation  Met with patient and son at bedside to discuss team's recommendation for IP Rehab.  Shared booklets, insurance verification letter, and answered initial questions.  I have also received medical clearance and will proceed with admission today.  Call if questions.    Carmelia Roller., CCC/SLP Admission Coordinator  [place]  Cell [phone]

## 2017-06-19 NOTE — Progress Notes (Signed)
Physical Therapy Treatment Patient Details Name: [name] MRN: [id number] DOB: 04-23-1945 Today's Date: 06/19/2017    History of Present Illness 72 y.o. female past medical history of HTN, DM2, HLD, chronic Afib on Xarelto, hx CVA presents to [facility] emergency developing headache yesterday afternoon and noticing difficulty with vision. The exact time of her last known normal is undetermined. CT head reveals a right PCA  Stroke. Patient on Xarelto for atrial fibrillation, however stopped it on Monday as she was scheduled to get a  nerve block for chronic back pain. MRI revealed Acute infarcts of the posterior right middle cerebral artery territory and the right posterior cerebral artery territory.Petechial hemorrhage in the distribution of the right PCA infarct.    PT Comments    Pt continues to make slowed progress towards her goals, however was limited in her safe mobility by anxiety with upright, decreased awareness of her deficits and R knee buckling with gait which exacerbates fear with movement possibly due to 5 prior falls in the last couple months. Pt is currently supervision for bed mobility, min-modA for transfers, and modA secondary to LoB for ambulation of 20 feet with RW. Discharge plans to CIR remain appropriate for return to PLOF. PT will continue to follow acutely.    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 Restrictions Weight Bearing Restrictions: No    Mobility  Bed Mobility Overal bed mobility: Needs Assistance Bed Mobility: Sit to Supine Rolling: Supervision   Supine to sit: Supervision     General bed mobility comments: HOB elevated, used bed rail  Transfers Overall  transfer level: Needs assistance Equipment used: 1 person hand held assist;Rolling walker (2 wheeled) Transfers: Sit to/from Stand Sit to Stand: Min assist         General transfer comment: attempted sit>stand from bed to RW and pt was limited by fear, pt would begin motion however she would not begin forward motion over CoG to facilitate power up. attempted again with HHA and with modA was able to attain stand, after donning shoes pt was able to stand with minA to RW.    Ambulation/Gait Ambulation/Gait assistance: Min assist Ambulation Distance (Feet): 20 Feet Assistive device: Rolling walker (2 wheeled) Gait Pattern/deviations: Step-through pattern;Decreased stride length;Shuffle;Wide base of support Gait velocity: slow Gait velocity interpretation: Below normal speed for age/gender General Gait Details: pt continues to be very fearful with gait, experienced 1x LoB due to knee buckling requiring modA to steady     Modified Rankin (Stroke Patients Only) Modified Rankin (Stroke Patients Only) Pre-Morbid Rankin Score: Slight disability Modified Rankin: Moderately severe disability     Balance Overall balance assessment: Needs assistance Sitting-balance support: No upper extremity supported;Feet unsupported Sitting balance-Leahy Scale: Good Sitting balance - Comments: seated on EOB on entry without supervision   Standing balance support: Bilateral upper extremity supported;During functional activity Standing balance-Leahy Scale: Poor Standing balance comment: dependent on UE support                            Cognition Arousal/Alertness: Awake/alert Behavior During Therapy: WFL for tasks assessed/performed Overall Cognitive Status: Impaired/Different from baseline Area of Impairment: Attention;Following commands;Safety/judgement;Awareness;Problem solving;Memory  Current Attention Level: Selective Memory: Decreased short-term  memory Following Commands: Follows one step commands with increased time;Follows multi-step commands inconsistently Safety/Judgement: Decreased awareness of safety;Decreased awareness of deficits(L sided neglect) Awareness: Emergent Problem Solving: Slow processing;Difficulty sequencing;Requires verbal cues;Requires tactile cues General Comments: pt with L hemiopnopsia, unaware of L sided neglect.             Pertinent Vitals/Pain Pain Assessment: No/denies pain           PT Goals (current goals can now be found in the care plan section) Acute Rehab PT Goals PT Goal Formulation: With patient Time For Goal Achievement: 06/29/17 Potential to Achieve Goals: Fair Progress towards PT goals: Progressing toward goals    Frequency    Min 4X/week      PT Plan Current plan remains appropriate       AM-PAC PT "6 Clicks" Daily Activity  Outcome Measure  Difficulty turning over in bed (including adjusting bedclothes, sheets and blankets)?: A Little Difficulty moving from lying on back to sitting on the side of the bed? : A Little Difficulty sitting down on and standing up from a chair with arms (e.g., wheelchair, bedside commode, etc,.)?: A Lot Help needed moving to and from a bed to chair (including a wheelchair)?: A Little Help needed walking in hospital room?: A Lot Help needed climbing 3-5 steps with a railing? : Total 6 Click Score: 14    End of Session Equipment Utilized During Treatment: Gait belt Activity Tolerance: Patient tolerated treatment well;No increased pain Patient left: with call bell/phone within reach;in chair;with chair alarm set Nurse Communication: Mobility status PT Visit Diagnosis: Other abnormalities of gait and mobility (R26.89);Muscle weakness (generalized) (M62.81);Difficulty in walking, not elsewhere classified (R26.2)     Time: 9767-3419 PT Time Calculation (min) (ACUTE ONLY): 24 min  Charges:  $Gait Training: 8-22 mins $Therapeutic  Activity: 8-22 mins                    G Codes:       Orpah Hausner B. Migdalia Dk PT, DPT Acute Rehabilitation  [phone] Pager [phone]     [place] 06/19/2017, 4:30 PM

## 2017-06-19 NOTE — Progress Notes (Signed)
Focus of session on education in compensatory strategies for L visual field cut during ADL. Family in room and educated in functional aspects of vision changes.  06/19/17 1656  OT Visit Information  Last OT Received On 06/19/17  Assistance Needed +1  History of Present Illness 72 y.o. female past medical history of HTN, DM2, HLD, chronic Afib on Xarelto, hx CVA presents to [facility] emergency developing headache yesterday afternoon and noticing difficulty with vision. The exact time of her last known normal is undetermined. CT head reveals a right PCA  Stroke. Patient on Xarelto for atrial fibrillation, however stopped it on Monday as she was scheduled to get a  nerve block for chronic back pain. MRI revealed Acute infarcts of the posterior right middle cerebral artery territory and the right posterior cerebral artery territory.Petechial hemorrhage in the distribution of the right PCA infarct.  Precautions  Precautions Fall  Precaution Comments L visual field cut, R knee buckles  Pain Assessment  Pain Assessment No/denies pain  Cognition  Arousal/Alertness Awake/alert  Behavior During Therapy WFL for tasks assessed/performed  Overall Cognitive Status Impaired/Different from baseline  Area of Impairment Attention;Following commands;Safety/judgement;Awareness;Problem solving;Memory  Current Attention Level Selective  Memory Decreased short-term memory  Following Commands Follows one step commands with increased time;Follows multi-step commands inconsistently  Safety/Judgement Decreased awareness of safety;Decreased awareness of deficits  Awareness Emergent  Problem Solving Slow processing;Difficulty sequencing;Requires verbal cues;Requires tactile cues  General Comments unaware of vision deficits  ADL  Overall ADL's  Needs assistance/impaired  Eating/Feeding Minimal assistance  Eating/Feeding Details (indicate cue type and reason) cues to locate items on L side of tray table  Grooming  Brushing hair;Standing;Minimal assistance  Grooming Details (indicate cue type and reason) cues to locate comb on L side of sink  Upper Body Dressing  Set up;Sitting  Upper Body Dressing Details (indicate cue type and reason) doffed front opening gown  Lower Body Dressing Supervision/safety;Sitting/lateral leans  Lower Body Dressing Details (indicate cue type and reason) shoes and socks  Toilet Transfer RW;Stand-pivot;Minimal assistance;BSC  Toilet Transfer Details (indicate cue type and reason) cues and physical assistance to navigate as unable to attend to or visualize L side of environment  Toileting- Clothing Manipulation and Hygiene Minimal assistance;Sit to/from stand  General ADL Comments Educated son and son's girlfriend in functional aspects of vision impairment.  Bed Mobility  General bed mobility comments pt seated at EOB upon arrival  Balance  Overall balance assessment Needs assistance  Sitting balance-Leahy Scale Good  Sitting balance - Comments no LOB with donning socks and shoes  Standing balance-Leahy Scale Poor  Standing balance comment dependent on UE support  Vision- Assessment  Visual Fields Left visual field deficit  Transfers  Overall transfer level Needs assistance  Equipment used Rolling walker (2 wheeled)  Transfers Sit to/from Stand  Sit to Stand Min assist  General transfer comment steadying assist to stand and pivot to [facility]  OT - End of Session  Equipment Utilized During Treatment Gait belt;Rolling walker  Activity Tolerance Patient tolerated treatment well  Patient left in bed;with call bell/phone within reach;with bed alarm set  OT Assessment/Plan  OT Plan Discharge plan remains appropriate  OT Visit Diagnosis Unsteadiness on feet (R26.81);Repeated falls (R29.6);Muscle weakness (generalized) (M62.81)  OT Frequency (ACUTE ONLY) Min 2X/week  Follow Up Recommendations CIR;Supervision/Assistance - 24 hour  AM-PAC OT "6 Clicks" Daily Activity Outcome  Measure  Help from another person eating meals? 3  Help from another person taking care of personal grooming? 3  Help from another person toileting, which includes using toliet, bedpan, or urinal? 3  Help from another person bathing (including washing, rinsing, drying)? 3  Help from another person to put on and taking off regular upper body clothing? 3  Help from another person to put on and taking off regular lower body clothing? 3  6 Click Score 18  ADL G Code Conversion CK  OT Goal Progression  Progress towards OT goals Progressing toward goals  Acute Rehab OT Goals  Patient Stated Goal to get better and go home  OT Goal Formulation With patient  Time For Goal Achievement 06/29/17  Potential to Achieve Goals Good  OT Time Calculation  OT Start Time (ACUTE ONLY) 1600  OT Stop Time (ACUTE ONLY) 1634  OT Time Calculation (min) 34 min  OT General Charges  $OT Visit 1 Visit  OT Treatments  $Self Care/Home Management  23-37 mins  06/19/2017 Nestor Lewandowsky, OTR/L Pager: [phone]

## 2017-06-19 NOTE — H&P (Signed)
Physical Medicine and Rehabilitation Admission H&P       Chief Complaint  Patient presents with  . Deficits due to stroke.     HPI:  [name] a 72 y.o.femalewith history of Afib, diabetes and RA who presented on 06/14/17 with headache and blurred vision after having stopped xarelto on 06/12/17 for nerve block. MRI revealed acute infarcts of the posterior right middle cerebral artery territory and the right posterior cerebral artery territory with petechial hemorrhage in the distribution of the right PCA infarct. Dr. Erlinda Hong felt that stroke embolic due to known A Fib--on  318m ECASA daily up due to size of stroke. To resume Xarelto in 7 days. Patient with ongoing left field cut with deficits in visual-spatial awareness and coordination, left inattention, significant cognitive deficits (MoCA 10/30) with lack of awareness of deficits and unsteady gait.  CIR was recommended due to functional deficits.     Review of Systems  Constitutional: Positive for diaphoresis. Negative for fever.  HENT: Positive for hearing loss. Negative for tinnitus.   Eyes: Negative for blurred vision.  Respiratory: Negative for cough and sputum production.   Cardiovascular: Negative for chest pain and palpitations.  Gastrointestinal: Negative for constipation, heartburn and nausea.  Genitourinary: Negative for dysuria and urgency.  Musculoskeletal: Positive for back pain, falls (multiple in last 2 months--due to right knee instability) and myalgias.  Skin: Negative for rash.  Neurological: Positive for speech change and focal weakness. Negative for dizziness and headaches.  Psychiatric/Behavioral: The patient is nervous/anxious.          Past Medical History:  Diagnosis Date  . Actinic keratosis   . Anemia    hx of  . [place]emia of chronic renal failure, stage 3 (moderate) ([place] 07/17/2015  . Anticoagulant causing adverse effect in therapeutic use 07/17/2015  . Atrial fibrillation ([facility]      a. persistent, she remains on Xarelto  . Carpal tunnel syndrome   . Chronic diastolic (congestive) heart failure ([facility]    a. 04/2016: echo showing a preserved EF of 55-60% with mild MR. LA and RA midly dilated.   . Edema    left leg at ankle resolved now  . Gastroesophageal reflux disease   . Hepatitis 1970   not sure what kind  . Hiatal hernia   . Hyperlipidemia   . Hypertension   . Iron deficiency anemia due to chronic blood loss 07/17/2015  . Jaundice    age 72 . Peripheral vascular disease ([place]   DVT left lower leg questionale told by 2 drs i had no clot, 1 md said i did  . Rheumatoid arthritis(714.0)   . Type II diabetes mellitus ([facility] [place]    type2         Past Surgical History:  Procedure Laterality Date  . CATARACT EXTRACTION Bilateral   . COLONOSCOPY WITH PROPOFOL N/A 02/20/2015   Procedure: COLONOSCOPY WITH PROPOFOL;  Surgeon: PCarol Ada MD;  [facility]: [facility];  Service: Endoscopy;  Laterality: N/A;  . ENTEROSCOPY N/A 03/23/2016   Procedure: ENTEROSCOPY;  Surgeon: PCarol Ada MD;  Location: [facility];  Service: Endoscopy;  Laterality: N/A;  . ENTEROSCOPY N/A 06/17/2016   Procedure: ENTEROSCOPY;  Surgeon: PCarol Ada MD;  [facility];  Service: Endoscopy;  Laterality: N/A;  . ESOPHAGOGASTRODUODENOSCOPY (EGD) WITH PROPOFOL N/A 02/20/2015   Procedure: ESOPHAGOGASTRODUODENOSCOPY (EGD) WITH PROPOFOL;  Surgeon: PCarol Ada MD;  [facility];  Service: Endoscopy;  Laterality: N/A;  . GIVENS CAPSULE STUDY N/A  03/21/2016   Procedure: GIVENS CAPSULE STUDY;  Surgeon: Carol Ada, MD;  Location: [facility];  Service: Endoscopy;  Laterality: N/A;  . HAND SURGERY  1995 and 1996   artificial joints both hands  . JOINT REPLACEMENT    . LUMBAR LAMINECTOMY/DECOMPRESSION MICRODISCECTOMY N/A 02/10/2016   Procedure: MICROLUMBAR DECOMPRESSION L4-L5 AND L3- L4, AND EXCISION OF SYNOVIAL CYST L4-L5;  Surgeon: Susa Day, MD;  Location: [facility];  Service: Orthopedics;  Laterality: N/A;  . TOTAL HIP ARTHROPLASTY Left 2009  . TOTAL HIP ARTHROPLASTY Right 04/03/2013   Procedure: RIGHT TOTAL HIP ARTHROPLASTY ANTERIOR APPROACH;  Surgeon: Gearlean Alf, MD;  Location: [facility];  Service: Orthopedics;  Laterality: Right;         Family History  Problem Relation Age of Onset  . Stroke Father   . Heart failure Sister   . Hypertension Sister   . Heart attack Neg Hx     Social History:  Lives with a boyfriend (83?) who does most of the home management. She uses rollator in the home and cane out of home. She  reports that  has never smoked.  She has never used smokeless tobacco. She reports that she does not drink alcohol or use drugs.    Allergies: No Known Allergies          Medications Prior to Admission  Medication Sig Dispense Refill  . Abatacept (ORENCIA CLICKJECT) 924 MG/ML SOAJ Inject 125 mg into the skin every Thursday.     [name] buPROPion (WELLBUTRIN SR) 150 MG 12 hr tablet Take 150 mg by mouth 2 (two) times daily.      . Cholecalciferol (VITAMIN D3) 1000 units CAPS Take 1,000 Units by mouth every evening.     . Cyanocobalamin (VITAMIN B 12 PO) Take 1,000 mcg by mouth every evening.     . diltiazem (CARDIZEM CD) 360 MG 24 hr capsule TAKE 1 CAPSULE (360 MG TOTAL) BY MOUTH DAILY. 90 capsule 3  . esomeprazole (NEXIUM) 40 MG capsule Take 40 mg by mouth every morning.     . ferrous gluconate (FERGON) 324 MG tablet Take 1 tablet (324 mg total) by mouth 2 (two) times daily with a meal. 60 tablet 3  . furosemide (LASIX) 20 MG tablet Take 2 tablets (40 mg total) by mouth daily. Take 2 tablets by mouth daily. TAKE AN ADDITIONAL 1 TAB AS NEEDED 90 tablet 5  . gabapentin (NEURONTIN) 300 MG capsule Take 300 mg by mouth 2 (two) times daily as needed for pain.  1  . glimepiride (AMARYL) 2 MG tablet Take 2 mg by mouth daily.     [name] leflunomide (ARAVA) 20 MG tablet Take 20 mg by mouth daily.     . metFORMIN (GLUCOPHAGE-XR) 500 MG 24 hr tablet Take 1,000 mg by mouth 2 (two) times daily.    . metoprolol tartrate (LOPRESSOR) 50 MG tablet TAKE 2 TABLETS (100 MG TOTAL) BY MOUTH 2 (TWO) TIMES DAILY. 120 tablet 11  . Multiple Vitamins-Minerals (PRESERVISION AREDS 2) CAPS Take 1 capsule by mouth 2 (two) times daily.    . nitroGLYCERIN (NITROSTAT) 0.4 MG SL tablet Place 1 tablet (0.4 mg total) under the tongue every 5 (five) minutes as needed for chest pain (x 3 doses). 25 tablet 2  . potassium chloride (KLOR-CON 10) 10 MEQ tablet Take 2 tablets (20 mEq total) 2 (two) times daily by mouth. 360 tablet 3  . predniSONE (DELTASONE) 5 MG tablet Take 5 mg by mouth daily.    [name]  rivaroxaban (XARELTO) 20 MG TABS tablet Take 20 mg by mouth daily with supper.    . rosuvastatin (CRESTOR) 10 MG tablet Take 10 mg by mouth every evening.     [name] VOLTAREN 1 % GEL Apply 2 g topically at bedtime as needed for pain. Knees, calf  2  . HYDROcodone-acetaminophen (NORCO/VICODIN) 5-325 MG tablet Take 1 tablet by mouth every 6 (six) hours as needed for pain.  0    Drug Regimen Review  Drug regimen was reviewed and remains appropriate with no significant issues identified  Home: Home Living Family/patient expects to be discharged to:: Private residence Living Arrangements: Spouse/significant other Available Help at Discharge: Family Type of Home: House Home Access: Stairs to enter Technical brewer of Steps: 3 Entrance Stairs-Rails: Right, Left, Can reach both Home Layout: One level Bathroom Shower/Tub: Chiropodist: Handicapped height Bathroom Accessibility: Yes Home Equipment: Environmental consultant - 4 wheels, Cane - single point Additional Comments: uses rollator the most at home   Functional History: Prior Function Level of Independence: Independent with assistive device(s) Comments: uses Rollator  Functional Status:  Mobility: Bed Mobility Overal bed mobility: Needs  Assistance Bed Mobility: Sit to Supine Rolling: Supervision Supine to sit: Supervision Sit to supine: Min assist General bed mobility comments: HOB elevated, used bed rail Transfers Overall transfer level: Needs assistance Equipment used: 1 person hand held assist Transfers: Sit to/from Stand Sit to Stand: Min assist General transfer comment: Min assist to power up and steady.  Ambulation/Gait Ambulation/Gait assistance: Min assist Ambulation Distance (Feet): 15 Feet(x2) Assistive device: Rolling walker (2 wheeled) Gait Pattern/deviations: Step-through pattern, Decreased stride length, Shuffle, Wide base of support General Gait Details: pt very unsteady, wide stance with minimal foot clearance, pt very rigid due to fear. pt with h/o 5 falls in last month Gait velocity: slow Gait velocity interpretation: Below normal speed for age/gender  ADL: ADL Overall ADL's : Needs assistance/impaired Eating/Feeding: Minimal assistance, Sitting Eating/Feeding Details (indicate cue type and reason): max cues and anchoring techniques to attend to L side of tray Grooming: Min guard, Standing Grooming Details (indicate cue type and reason): max cues to locate hot water faucet and sequence to turn on water Upper Body Bathing: Minimal assistance, Sitting Lower Body Bathing: Min guard, Sit to/from stand Upper Body Dressing : Minimal assistance, Sitting Lower Body Dressing: Min guard, Sit to/from stand Toilet Transfer: Ambulation, RW, Cueing for sequencing, Cueing for safety, Moderate assistance Toilet Transfer Details (indicate cue type and reason): cues and physical assistance to navigate as unable to attend to or visualize L side of environment Functional mobility during ADLs: Minimal assistance, Rolling walker General ADL Comments: Significant assistance to navigate due to running into items on the L. Unable to read her cell phone and only sees R side of screen.   Cognition: Cognition Overall  Cognitive Status: Impaired/Different from baseline Arousal/Alertness: Awake/alert Orientation Level: Oriented X4 Attention: Sustained, Selective Sustained Attention: Impaired Sustained Attention Impairment: Verbal basic, Functional basic(refocusing, verbal cues in conversation and cognitive tasks) Selective Attention: Impaired Selective Attention Impairment: Verbal basic, Functional basic Memory: Impaired Memory Impairment: Decreased recall of new information, Storage deficit, Decreased short term memory(attention impacts) Decreased Short Term Memory: Verbal basic, Functional basic Awareness: Impaired Awareness Impairment: Intellectual impairment Problem Solving: Impaired Problem Solving Impairment: Functional basic(0/3 ways to make $13) Executive Function: Sequencing, Organizing, Initiating, Self Correcting, Self Monitoring Sequencing: Impaired Sequencing Impairment: Verbal basic, Functional basic Organizing: Impaired Organizing Impairment: Verbal basic, Functional basic Initiating: Impaired Initiating Impairment: Verbal basic, Functional basic(cues  for task initiation and to respond to simple questions) Self Monitoring: Impaired Self Monitoring Impairment: Functional basic Self Correcting: Impaired Self Correcting Impairment: Functional basic Behaviors: Poor frustration tolerance Safety/Judgment: Impaired Cognition Arousal/Alertness: Awake/alert Behavior During Therapy: WFL for tasks assessed/performed Overall Cognitive Status: Impaired/Different from baseline Area of Impairment: Attention, Following commands, Safety/judgement, Awareness, Problem solving, Memory Current Attention Level: Selective Memory: Decreased short-term memory Following Commands: Follows one step commands with increased time, Follows multi-step commands inconsistently Safety/Judgement: Decreased awareness of safety, Decreased awareness of deficits(L sided neglect) Awareness: Emergent Problem Solving: Slow  processing, Difficulty sequencing, Requires verbal cues, Requires tactile cues General Comments: pt with L hemiopnopsia, unaware of L sided neglect.   Blood pressure 110/80, pulse 87, temperature 98.2 F (36.8 C), temperature source Oral, resp. rate 17, height '5\' 7"'  (1.702 m), weight 77.6 kg (171 lb 1.2 oz), SpO2 99 %. Physical Exam  Nursing note and vitals reviewed. Constitutional: She is oriented to person, place, and time. She appears well-developed and well-nourished. No distress.  HENT:  Head: Normocephalic and atraumatic.  Eyes: Conjunctivae are normal. Pupils are equal, round, and reactive to light.  Neck: Normal range of motion. Neck supple.  Cardiovascular: Normal rate and regular rhythm.  Respiratory: Effort normal and breath sounds normal. No stridor. No respiratory distress. She has no wheezes.  GI: Soft. Bowel sounds are normal. She exhibits no distension. There is no tenderness.  Musculoskeletal: She exhibits no edema or tenderness.  Evidence of muscle wasting RLE. Bilateral hands with ulnar deviation and herbedon's  nodules.   Neurological: She is alert and oriented to person, place, and time.  Speech clear and tangential. Able to follow basic commands without difficult. Mild left sided weakness. Left visual-spatial deficits, ?left HH. And left intattention.   Skin: Skin is warm and dry. She is not diaphoretic.  Psychiatric: She has a normal mood and affect. Her speech is tangential. She expresses impulsivity. She is inattentive.    LabResultsLast48Hours        Results for orders placed or performed during the hospital encounter of 06/14/17 (from the past 48 hour(s))  Glucose, capillary     Status: Abnormal   Collection Time: 06/17/17  4:44 PM  Result Value Ref Range   Glucose-Capillary 250 (H) 65 - 99 mg/dL  Glucose, capillary     Status: Abnormal   Collection Time: 06/17/17  9:59 PM  Result Value Ref Range   Glucose-Capillary 190 (H) 65 - 99 mg/dL    Basic metabolic panel     Status: Abnormal   Collection Time: 06/18/17  3:50 AM  Result Value Ref Range   Sodium 140 135 - 145 mmol/L   Potassium 3.6 3.5 - 5.1 mmol/L   Chloride 110 101 - 111 mmol/L   CO2 20 (L) 22 - 32 mmol/L   Glucose, Bld 176 (H) 65 - 99 mg/dL   BUN 22 (H) 6 - 20 mg/dL   Creatinine, Ser 1.07 (H) 0.44 - 1.00 mg/dL   Calcium 9.2 8.9 - 10.3 mg/dL   GFR calc non Af Amer 51 (L) >60 mL/min   GFR calc Af Amer 59 (L) >60 mL/min    Comment: (NOTE) The eGFR has been calculated using the CKD EPI equation. This calculation has not been validated in all clinical situations. eGFR's persistently <60 mL/min signify possible Chronic Kidney Disease.    Anion gap 10 5 - 15    Comment: Performed at [facility] [address], [place], [place] [address]  CBC  Status: Abnormal   Collection Time: 06/18/17  3:50 AM  Result Value Ref Range   WBC 10.0 4.0 - 10.5 K/uL   RBC 4.68 3.87 - 5.11 MIL/uL   Hemoglobin 10.9 (L) 12.0 - 15.0 g/dL   HCT 36.7 36.0 - 46.0 %   MCV 78.4 78.0 - 100.0 fL   MCH 23.3 (L) 26.0 - 34.0 pg   MCHC 29.7 (L) 30.0 - 36.0 g/dL   RDW 17.5 (H) 11.5 - 15.5 %   Platelets 237 150 - 400 K/uL    Comment: Performed at [facility], [place] [address] [place], [place] [address]  Glucose, capillary     Status: Abnormal   Collection Time: 06/18/17  7:54 AM  Result Value Ref Range   Glucose-Capillary 169 (H) 65 - 99 mg/dL  Glucose, capillary     Status: Abnormal   Collection Time: 06/18/17 12:23 PM  Result Value Ref Range   Glucose-Capillary 221 (H) 65 - 99 mg/dL  Glucose, capillary     Status: Abnormal   Collection Time: 06/18/17  5:47 PM  Result Value Ref Range   Glucose-Capillary 185 (H) 65 - 99 mg/dL  Glucose, capillary     Status: Abnormal   Collection Time: 06/18/17  9:26 PM  Result Value Ref Range   Glucose-Capillary 220 (H) 65 - 99 mg/dL   Comment 1 Notify RN    Comment 2 Document in Chart    Glucose, capillary     Status: Abnormal   Collection Time: 06/19/17  8:00 AM  Result Value Ref Range   Glucose-Capillary 149 (H) 65 - 99 mg/dL  Glucose, capillary     Status: Abnormal   Collection Time: 06/19/17 12:07 PM  Result Value Ref Range   Glucose-Capillary 185 (H) 65 - 99 mg/dL     ImagingResults(Last48hours)  No results found.       Medical Problem List and Plan: 1.  Functional and visual-spatial deficits secondary to right PCA/MCA infarct             -admit to inpatient rehab 2.  DVT Prophylaxis/Anticoagulation: Will start  Lovenox 40 mg daily.  3. Chronic LBP/Right knee pain/Pain Management: tylenol prn and local measures. Continue to hold Norco and gabapentin (used prn) 4. Mood: LCSW to follow for evaluation and support. Continue Wellbutrin bid 5. Neuropsych: This patient is not fully capable of making decisions on her own behalf. 6. Skin/Wound Care: routine pressure relief measures 7. Fluids/Electrolytes/Nutrition: Monitor I/O. Check lytes in am.  8. A fib: Monitor HR bid. Continue Metoprolol bid with Cardizem daily. .  9. RA: managed with Arava and low dose prednisone.   10. GERD: On Protonix.  11.Hgb A1C- 6.9: T2DM: Monitor BS ac/hs. Resume Amaryl as po intake is good--continue to hold metformin for now.  12. ARF: Encourage fluid intake. Will recheck lytes in am.   Post Admission Physician Evaluation: 1. Functional deficits secondary  to right PCA/MCA infarct. 2. Patient is admitted to receive collaborative, interdisciplinary care between the physiatrist, rehab nursing staff, and therapy team. 3. Patient's level of medical complexity and substantial therapy needs in context of that medical necessity cannot be provided at a lesser intensity of care such as a SNF. 4. Patient has experienced substantial functional loss from his/her baseline which was documented above under the "Functional History" and "Functional Status" headings.  Judging by the  patient's diagnosis, physical exam, and functional history, the patient has potential for functional progress which will result in measurable gains while on  inpatient rehab.  These gains will be of substantial and practical use upon discharge  in facilitating mobility and self-care at the household level. 5. Physiatrist will provide 24 hour management of medical needs as well as oversight of the therapy plan/treatment and provide guidance as appropriate regarding the interaction of the two. 6. The Preadmission Screening has been reviewed and patient status is unchanged unless otherwise stated above. 7. 24 hour rehab nursing will assist with bladder management, bowel management, safety, skin/wound care, disease management, medication administration, pain management and patient education  and help integrate therapy concepts, techniques,education, etc. 8. PT will assess and treat for/with: Lower extremity strength, range of motion, stamina, balance, functional mobility, safety, adaptive techniques and equipment, NMR, cognitive perceptual rx, family education.   Goals are: mod I to supervision . 9. OT will assess and treat for/with: ADL's, functional mobility, safety, upper extremity strength, adaptive techniques and equipment, NMR, visua-spatial rx, family ed.   Goals are: mod I to supervision. Therapy may proceed with showering this patient. 10. SLP will assess and treat for/with: cognition, communication, family ed.  Goals are: supervision to mod I. 11. Case Management and Social Worker will assess and treat for psychological issues and discharge planning. 12. Team conference will be held weekly to assess progress toward goals and to determine barriers to discharge. 13. Patient will receive at least 3 hours of therapy per day at least 5 days per week. 14. ELOS: 7-11 days       15. Prognosis:  excellent     Meredith Staggers, MD, [facility] 06/19/2017    Bary Leriche, PA-C 06/19/2017

## 2017-06-19 NOTE — Progress Notes (Signed)
[name] to be D/C'd inpatient Rehab per MD order.  Discussed with the patient and all questions fully answered. Report given to the inpatient rehab nurse on [place] was transferred via wheelchair to [place] at 1745.  VSS, Skin clean, dry and intact without evidence of skin break down, no evidence of skin tears noted. IV catheter discontinued intact. Site without signs and symptoms of complications. Dressing and pressure applied.  An After Visit Summary was printed and given to the patient. Patient received prescription.  D/c education completed with patient/family including follow up instructions, medication list, d/c activities limitations if indicated, with other d/c instructions as indicated by MD - patient able to verbalize understanding, all questions fully answered.   Patient instructed to return to ED, call 911, or call MD for any changes in condition.   Patient escorted via [place], and D/C home via private auto.  Dorris Carnes 06/19/2017 6:21 PM

## 2017-06-19 NOTE — Progress Notes (Signed)
INTERIM PROGRESS:  Patient has consented to CIR and is medically ready for d/c to CIR.  -Dr. Criss Rosales

## 2017-06-19 NOTE — Progress Notes (Signed)
Patient and family were informed about rehab process including patient safety plan ,and rehab booklet.

## 2017-06-19 NOTE — Care Management Note (Addendum)
Case Management Note  Patient Details  Name: [name] MRN: [id number] Date of Birth: Aug 02, 1945  Subjective/Objective:                  Presented with headache and blurry vision found to have R posteriorcerebral arteryCVA. PMH : HTN, DM2, HLD,chronicAfib/ Xarelto, CVA, GIbleed,Rheumatoid arthritis andHF. PTA [facility] with [facility]  PCP: Leonia Reader   Action/Plan: Transition to CIR today.  Expected Discharge Date:  06/19/17               Expected Discharge Plan:  CIR  In-House Referral:     Discharge planning Services  CM Consult  Status of Service:  Completed, signed off  If discussed at Long Length of Stay Meetings, dates discussed:    Additional Comments:  Sharin Mons, RN 06/19/2017, 4:27 PM

## 2017-06-19 NOTE — PMR Pre-admission (Signed)
PMR Admission Coordinator Pre-Admission Assessment  Patient: [name] is an 72 y.o., female MRN: [id number] DOB: 09-13-1945 Height: 5\' 7"  (170.2 cm) Weight: 77.6 kg (171 lb 1.2 oz)              Insurance Information HMO:     PPO:      PCP:      IPA:      80/20:      OTHER:  PRIMARY: Medicare A & B      Policy#: [id number]      Subscriber: Self CM Name:       Phone#:      Fax#:  Pre-Cert#: Eligible       Employer: Retired  Benefits:  Phone #: Verified online     Name: [employer]  Eff. Date: 03/04/96     Deduct: $1364      Out of Pocket Max: N/A      Life Max: N/A CIR: 100%      SNF: 1005 days 1-20, 80% days 21-100 Outpatient: 80%     Co-Pay: 20% Home Health: 100%      Co-Pay: $0 DME: 80%     Co-Pay: 20% Providers: Patient's Choice   SECONDARY: Medicaid [place]      Policy#: [id number]      Subscriber: Self CM Name:       Phone#:      Fax#:  Pre-Cert#: Coverage code: [place]      Employer: Retired  Benefits:  Phone #: [phone]     Name: Verified via automated system  Eff. Date: Eligible as of 06/19/17     Deduct:       Out of Pocket Max:       Life Max:  CIR:       SNF:  Outpatient:      Co-Pay:  Home Health:       Co-Pay:  DME:      Co-Pay:   Medicaid Application Date:       Case Manager:  Disability Application Date:       Case Worker:   Emergency Contact Information Contact Information    Name Relation Home Work Mobile   [place] Son  [phone]   [place] Significant other [phone]  [phone]   Kellie [place]ro[place] [phone]  [phone]     Current Medical History  Patient Admitting Diagnosis:  Right PCA/MCA infarct  History of Present Illness:  Christina L Rumleyis a 72 y.o.femalewith history of Afib, diabetes, and RA who presented on 06/14/17 with headache and blurred vision after having stopped xarelto on 06/12/17 for nerve block. MRI revealed acute infarcts of the posterior right middle cerebral artery territory and the  right posterior cerebral artery territory with petechial hemorrhage in the distribution of the right PCA infarct. Dr. Erlinda Hong felt that stroke embolic due to known Afib--on  325mg  ECASA daily up due to size of stroke. To resume Xarelto in 7 days. Patient with ongoing left field cut with deficits in visual-spatial awareness and coordination, left inattention, significant cognitive deficits (MoCA 10/30) with lack of awareness of deficits, and unsteady gait.  CIR was recommended due to functional deficits and patient admitted 06/19/17.  NIH Total: 5    Past Medical History  Past Medical History:  Diagnosis Date  . Actinic keratosis   . Anemia    hx of  . Anemia of chronic renal failure, stage 3 (moderate) ([place]) 07/17/2015  . Anticoagulant causing adverse effect in therapeutic use 07/17/2015  .  Atrial fibrillation ([place])    a. persistent, she remains on Xarelto  . Carpal tunnel syndrome   . Chronic diastolic (congestive) heart failure ([place])    a. 04/2016: echo showing a preserved EF of 55-60% with mild MR. LA and RA midly dilated.   . Edema    left leg at ankle resolved now  . Gastroesophageal reflux disease   . Hepatitis 1970   not sure what kind  . Hiatal hernia   . Hyperlipidemia   . Hypertension   . Iron deficiency anemia due to chronic blood loss 07/17/2015  . Jaundice    age 58  . Peripheral vascular disease ([place]) yrs ago   DVT left lower leg questionale told by 2 drs i had no clot, 1 md said i did  . Rheumatoid arthritis(714.0)   . Right knee pain   . Type II diabetes mellitus (HCC)    type2    Family History  family history includes Heart failure in her sister; Hypertension in her sister; Stroke in her father.  Prior Rehab/Hospitalizations:  Has the patient had major surgery during 100 days prior to admission? No  Current Medications   Current Facility-Administered Medications:  .  acetaminophen (TYLENOL) tablet 650 mg, 650 mg, Oral, Q4H PRN, 650 mg at 06/15/17 0751 **OR**  acetaminophen (TYLENOL) solution 650 mg, 650 mg, Per Tube, Q4H PRN **OR** acetaminophen (TYLENOL) suppository 650 mg, 650 mg, Rectal, Q4H PRN, Shirley, [place], DO .  aspirin tablet 325 mg, 325 mg, Oral, Daily, Bland, Scott, DO, 325 mg at 06/19/17 1032 .  baclofen (LIORESAL) tablet 5 mg, 5 mg, Oral, TID, Lovenia Kim, MD, 5 mg at 06/19/17 1033 .  bethanechol (URECHOLINE) tablet 5 mg, 5 mg, Oral, TID, Bland, Scott, DO, 5 mg at 06/19/17 1032 .  buPROPion (WELLBUTRIN SR) 12 hr tablet 150 mg, 150 mg, Oral, BID, Enid Derry, [place], DO, 150 mg at 06/19/17 1033 .  diltiazem (CARDIZEM CD) 24 hr capsule 360 mg, 360 mg, Oral, Daily, Abraham, Sherin, DO, 360 mg at 06/19/17 1032 .  insulin aspart (novoLOG) injection 0-9 Units, 0-9 Units, Subcutaneous, TID WC, Shirley, [place], DO, 2 Units at 06/19/17 1236 .  leflunomide (ARAVA) tablet 20 mg, 20 mg, Oral, Daily, Enid Derry, [place], DO, 20 mg at 06/19/17 1033 .  metoprolol tartrate (LOPRESSOR) tablet 100 mg, 100 mg, Oral, BID, Tammi Klippel, Sherin, DO, 100 mg at 06/19/17 1032 .  pantoprazole (PROTONIX) EC tablet 40 mg, 40 mg, Oral, Daily, Enid Derry, [place], DO, 40 mg at 06/19/17 1032 .  predniSONE (DELTASONE) tablet 5 mg, 5 mg, Oral, Daily, Enid Derry, [place], DO, 5 mg at 06/19/17 1033 .  rosuvastatin (CRESTOR) tablet 40 mg, 40 mg, Oral, QPM, Lovenia Kim, MD, 40 mg at 06/18/17 1812 .  senna-docusate (Senokot-S) tablet 1 tablet, 1 tablet, Oral, QHS PRN, Shirley, [place], DO  Patients Current Diet: Fall precautions Diet heart healthy/carb modified Room service appropriate? Yes; Fluid consistency: Thin  Precautions / Restrictions Precautions Precautions: Fall Precaution Comments: severe L sided neglect and L hemiopnosia Restrictions Weight Bearing Restrictions: No   Has the patient had 2 or more falls or a fall with injury in the past year?Yes  Prior Activity Level Limited Community (1-2x/wk): Prior to admission patient's significant other, Grayland Ormond would drive to shop or  run arrands.  Her son, Fara Olden would assist with trnasport to medical appointments at times.  Two weeks prior to admission patient had started falling at home; however, prior to this she was fully independent at home with use of a  Rollator walker.    Home Assistive Devices / Equipment Home Assistive Devices/Equipment: Cane (specify quad or straight), Walker (specify type) Home Equipment: Walker - 4 wheels, Cane - single point  Prior Device Use: Indicate devices/aids used by the patient prior to current illness, exacerbation or injury? Walker  Prior Functional Level Prior Function Level of Independence: Independent with assistive device(s) Comments: uses Rollator  Self Care: Did the patient need help bathing, dressing, using the toilet or eating? Independent  Indoor Mobility: Did the patient need assistance with walking from room to room (with or without device)? Independent  Stairs: Did the patient need assistance with internal or external stairs (with or without device)? Independent  Functional Cognition: Did the patient need help planning regular tasks such as shopping or remembering to take medications? Independent  Current Functional Level Cognition  Arousal/Alertness: Awake/alert Overall Cognitive Status: Impaired/Different from baseline Current Attention Level: Selective Orientation Level: Oriented X4 Following Commands: Follows one step commands with increased time, Follows multi-step commands inconsistently Safety/Judgement: Decreased awareness of safety, Decreased awareness of deficits(L sided neglect) General Comments: pt with L hemiopnopsia, unaware of L sided neglect. Attention: Sustained, Selective Sustained Attention: Impaired Sustained Attention Impairment: Verbal basic, Functional basic(refocusing, verbal cues in conversation and cognitive tasks) Selective Attention: Impaired Selective Attention Impairment: Verbal basic, Functional basic Memory: Impaired Memory  Impairment: Decreased recall of new information, Storage deficit, Decreased short term memory(attention impacts) Decreased Short Term Memory: Verbal basic, Functional basic Awareness: Impaired Awareness Impairment: Intellectual impairment Problem Solving: Impaired Problem Solving Impairment: Functional basic(0/3 ways to make $13) Executive Function: Sequencing, Organizing, Initiating, Self Correcting, Self Monitoring Sequencing: Impaired Sequencing Impairment: Verbal basic, Functional basic Organizing: Impaired Organizing Impairment: Verbal basic, Functional basic Initiating: Impaired Initiating Impairment: Verbal basic, Functional basic(cues for task initiation and to respond to simple questions) Self Monitoring: Impaired Self Monitoring Impairment: Functional basic Self Correcting: Impaired Self Correcting Impairment: Functional basic Behaviors: Poor frustration tolerance Safety/Judgment: Impaired    Extremity Assessment (includes Sensation/Coordination)  Upper Extremity Assessment: Generalized weakness  Lower Extremity Assessment: Generalized weakness    ADLs  Overall ADL's : Needs assistance/impaired Eating/Feeding: Minimal assistance, Sitting Eating/Feeding Details (indicate cue type and reason): max cues and anchoring techniques to attend to L side of tray Grooming: Min guard, Standing Grooming Details (indicate cue type and reason): max cues to locate hot water faucet and sequence to turn on water Upper Body Bathing: Minimal assistance, Sitting Lower Body Bathing: Min guard, Sit to/from stand Upper Body Dressing : Minimal assistance, Sitting Lower Body Dressing: Min guard, Sit to/from stand Toilet Transfer: Ambulation, RW, Cueing for sequencing, Cueing for safety, Moderate assistance Toilet Transfer Details (indicate cue type and reason): cues and physical assistance to navigate as unable to attend to or visualize L side of environment Functional mobility during ADLs:  Minimal assistance, Rolling walker General ADL Comments: Significant assistance to navigate due to running into items on the L. Unable to read her cell phone and only sees R side of screen.     Mobility  Overal bed mobility: Needs Assistance Bed Mobility: Sit to Supine Rolling: Supervision Supine to sit: Supervision Sit to supine: Min assist General bed mobility comments: HOB elevated, used bed rail    Transfers  Overall transfer level: Needs assistance Equipment used: 1 person hand held assist Transfers: Sit to/from Stand Sit to Stand: Min assist General transfer comment: Min assist to power up and steady.     Ambulation / Gait / Stairs / Wheelchair Mobility  Ambulation/Gait Ambulation/Gait assistance: Min assist  Ambulation Distance (Feet): 15 Feet(x2) Assistive device: Rolling walker (2 wheeled) Gait Pattern/deviations: Step-through pattern, Decreased stride length, Shuffle, Wide base of support General Gait Details: pt very unsteady, wide stance with minimal foot clearance, pt very rigid due to fear. pt with h/o 5 falls in last month Gait velocity: slow Gait velocity interpretation: Below normal speed for age/gender    Posture / Balance Dynamic Sitting Balance Sitting balance - Comments: seated on EOB on entry without supervision Balance Overall balance assessment: Needs assistance Sitting-balance support: No upper extremity supported, Feet unsupported Sitting balance-Leahy Scale: Good Sitting balance - Comments: seated on EOB on entry without supervision Standing balance support: Bilateral upper extremity supported, During functional activity Standing balance-Leahy Scale: Poor Standing balance comment: dependent on UE support    Special needs/care consideration BiPAP/CPAP: No CPM: No Continuous Drip IV: No Dialysis: No         Life Vest: No Oxygen: No Special Bed: No Trach Size: No Wound Vac (area): No       Skin: Dry, Moisture associated skin damage to groin area                                Bowel mgmt: Continent, last BM 06/18/17 Bladder mgmt: Continent  Diabetic mgmt: Yes, managed with oral medications prior to admission.  Patient also reports that she tries to stay away from sweet things     Previous Home Environment Living Arrangements: Spouse/significant other Available Help at Discharge: Family Type of Home: House Home Layout: One level Home Access: Stairs to enter Entrance Stairs-Rails: Right, Left, Can reach both Entrance Stairs-Number of Steps: 3 Bathroom Shower/Tub: Chiropodist: Handicapped height Bathroom Accessibility: Yes How Accessible: Accessible via walker [place]: Yes Type of Home Care Services: Homehealth aide, Home RN Additional Comments: uses rollator the most at home  Discharge Living Setting Plans for Discharge Living Setting: Lives with (comment)(Significant other, Grayland Ormond ) Type of Home at Discharge: Mobile home Discharge Home Layout: One level Discharge Home Access: Stairs to enter Entrance Stairs-Rails: Can reach both Entrance Stairs-Number of Steps: 3, then 1  Discharge Bathroom Shower/Tub: Tub/shower unit(seat in the tub) Discharge Bathroom Toilet: Standard Discharge Bathroom Accessibility: No Does the patient have any problems obtaining your medications?: No  Social/Family/Support Systems Patient Roles: Partner, Parent Contact Information: Maxwell Caul [phone] Anticipated Caregiver: Son, son's girlfriend, significant other  Anticipated Caregiver's Contact Information: Son to coordinate, see above Ability/Limitations of Caregiver: Son works out of town some Careers adviser: 24/7 Discharge Plan Discussed with Primary Caregiver: Yes Is Caregiver In Agreement with Plan?: Yes Does Caregiver/Family have Issues with Lodging/Transportation while Pt is in Rehab?: No  Goals/Additional Needs Patient/Family Goal for Rehab: PT/OT: Mod I-Supervision; SLP: Mod I  Expected  length of stay: 7-10 days  Cultural Considerations: None Dietary Needs: Heart Healthy, Carb. Mod. diet restrictions. Equipment Needs: TBD Special Service Needs: Pt. with impaired insight into current deficits  Additional Information: Son reports that he and his girlfriend will be assisting at D/C Pt/Family Agrees to Admission and willing to participate: Yes Program Orientation Provided & Reviewed with Pt/Caregiver Including Roles  & Responsibilities: Yes  Decrease burden of Care through IP rehab admission: No  Possible need for SNF placement upon discharge: Not anticipated   Patient Condition: This patient's medical and functional status has changed since the consult dated 06/18/17 at 9:10 AM in which the Rehabilitation Physician determined and documented that the patient  was potentially appropriate for intensive rehabilitative care in an inpatient rehabilitation facility. Issues have been addressed and update has been discussed with Dr. Naaman Plummer and patient now appropriate for inpatient rehabilitation. Will admit to inpatient rehab today.   Preadmission Screen Completed By:  Gunnar Fusi, 06/19/2017 1:29 PM ______________________________________________________________________   Discussed status with Dr. Naaman Plummer on 06/19/17 at 1340 and received telephone approval for admission today.  Admission Coordinator:  Gunnar Fusi, time 1340/Date 06/19/17

## 2017-06-20 ENCOUNTER — Inpatient Hospital Stay (HOSPITAL_COMMUNITY): Payer: Medicare Other

## 2017-06-20 ENCOUNTER — Other Ambulatory Visit: Payer: Self-pay

## 2017-06-20 ENCOUNTER — Inpatient Hospital Stay (HOSPITAL_COMMUNITY): Payer: Medicare Other | Admitting: Physical Therapy

## 2017-06-20 ENCOUNTER — Encounter (HOSPITAL_COMMUNITY): Payer: Self-pay

## 2017-06-20 ENCOUNTER — Inpatient Hospital Stay (HOSPITAL_COMMUNITY): Payer: Medicare Other | Admitting: Occupational Therapy

## 2017-06-20 DIAGNOSIS — I69398 Other sequelae of cerebral infarction: Secondary | ICD-10-CM

## 2017-06-20 DIAGNOSIS — R414 Neurologic neglect syndrome: Secondary | ICD-10-CM

## 2017-06-20 DIAGNOSIS — H53462 Homonymous bilateral field defects, left side: Secondary | ICD-10-CM

## 2017-06-20 DIAGNOSIS — R269 Unspecified abnormalities of gait and mobility: Secondary | ICD-10-CM

## 2017-06-20 LAB — CBC WITH DIFFERENTIAL/PLATELET
Basophils Absolute: 0 10*3/uL (ref 0.0–0.1)
Basophils Relative: 0 %
EOS ABS: 0.2 10*3/uL (ref 0.0–0.7)
EOS PCT: 2 %
HCT: 38 % (ref 36.0–46.0)
Hemoglobin: 11.4 g/dL — ABNORMAL LOW (ref 12.0–15.0)
LYMPHS ABS: 1.9 10*3/uL (ref 0.7–4.0)
LYMPHS PCT: 22 %
MCH: 23.9 pg — AB (ref 26.0–34.0)
MCHC: 30 g/dL (ref 30.0–36.0)
MCV: 79.7 fL (ref 78.0–100.0)
MONO ABS: 0.7 10*3/uL (ref 0.1–1.0)
Monocytes Relative: 7 %
Neutro Abs: 6 10*3/uL (ref 1.7–7.7)
Neutrophils Relative %: 69 %
PLATELETS: 248 10*3/uL (ref 150–400)
RBC: 4.77 MIL/uL (ref 3.87–5.11)
RDW: 17.8 % — ABNORMAL HIGH (ref 11.5–15.5)
WBC: 8.7 10*3/uL (ref 4.0–10.5)

## 2017-06-20 LAB — GLUCOSE, CAPILLARY
GLUCOSE-CAPILLARY: 196 mg/dL — AB (ref 65–99)
GLUCOSE-CAPILLARY: 140 mg/dL — AB (ref 65–99)
Glucose-Capillary: 163 mg/dL — ABNORMAL HIGH (ref 65–99)

## 2017-06-20 LAB — COMPREHENSIVE METABOLIC PANEL
ALT: 15 U/L (ref 14–54)
AST: 15 U/L (ref 15–41)
Albumin: 3.3 g/dL — ABNORMAL LOW (ref 3.5–5.0)
Alkaline Phosphatase: 123 U/L (ref 38–126)
Anion gap: 10 (ref 5–15)
BUN: 16 mg/dL (ref 6–20)
CHLORIDE: 109 mmol/L (ref 101–111)
CO2: 24 mmol/L (ref 22–32)
Calcium: 9.4 mg/dL (ref 8.9–10.3)
Creatinine, Ser: 0.8 mg/dL (ref 0.44–1.00)
Glucose, Bld: 152 mg/dL — ABNORMAL HIGH (ref 65–99)
POTASSIUM: 3.5 mmol/L (ref 3.5–5.1)
SODIUM: 143 mmol/L (ref 135–145)
Total Bilirubin: 0.5 mg/dL (ref 0.3–1.2)
Total Protein: 6.6 g/dL (ref 6.5–8.1)

## 2017-06-20 NOTE — Progress Notes (Signed)
Patient information reviewed and entered into eRehab system by Vasiliki Smaldone, RN, CRRN, PPS Coordinator.  Information including medical coding and functional independence measure will be reviewed and updated through discharge.     Per nursing patient was given "Data Collection Information Summary for Patients in Inpatient Rehabilitation Facilities with attached "Privacy Act Statement-Health Care Records" upon admission.  

## 2017-06-20 NOTE — Plan of Care (Signed)
Progressing Consults RH STROKE PATIENT EDUCATION Description See Patient Education module for education specifics  06/20/2017 1831 - Progressing by Glean Salen, RN 06/20/2017 1017 - Progressing by Glean Salen, RN Nutrition Consult-if indicated 06/20/2017 4403 - Progressing by Glean Salen, RN 06/20/2017 1017 - Progressing by Glean Salen, RN Diabetes Guidelines if Diabetic/Glucose > 140 Description If diabetic or lab glucose is > 140 mg/dl - Initiate Diabetes/Hyperglycemia Guidelines & Document Interventions  06/20/2017 1831 - Progressing by Glean Salen, RN 06/20/2017 1017 - Progressing by Glean Salen, RN RH BOWEL ELIMINATION RH STG MANAGE BOWEL WITH ASSISTANCE Description STG Manage Bowel with Assistance. 06/20/2017 1831 - Progressing by Glean Salen, RN Flowsheets Taken 06/20/2017 1831  STG: Pt will manage bowels with assistance 6-Modified independent 06/20/2017 1017 - Progressing by Glean Salen, RN Flowsheets Taken 06/20/2017 1017  STG: Pt will manage bowels with assistance 6-Modified independent RH STG MANAGE BOWEL W/MEDICATION W/ASSISTANCE Description STG Manage Bowel with Medication with Assistance. 06/20/2017 1831 - Progressing by Glean Salen, RN Flowsheets Taken 06/20/2017 1831  STG: Pt will manage bowels with medication with assistance 6-Modified independent 06/20/2017 1017 - Progressing by Glean Salen, RN Flowsheets Taken 06/20/2017 1017  STG: Pt will manage bowels with medication with assistance 6-Modified independent RH STG MANAGE BOWEL W/EQUIPMENT W/ASSISTANCE Description STG Manage Bowel With Equipment With Assistance 06/20/2017 1831 - Progressing by Glean Salen, RN Flowsheets Taken 06/20/2017 1831  STG: Pt will manage bowels with equipment with assistance 6-Modified independent 06/20/2017 1017 - Progressing by Glean Salen, RN Flowsheets Taken 06/20/2017 1017  STG: Pt will manage bowels with equipment with assistance 6-Modified  independent RH OTHER STG BOWEL ELIMINATION GOALS W/ASSIST Description Other STG Bowel Elimination Goals With Assistance. 06/20/2017 1831 - Progressing by Glean Salen, RN 06/20/2017 1017 - Progressing by Glean Salen, RN RH BLADDER ELIMINATION RH STG MANAGE BLADDER WITH ASSISTANCE Description STG Manage Bladder With Assistance 06/20/2017 1831 - Progressing by Glean Salen, RN Flowsheets Taken 06/20/2017 1831  STG: Pt will manage bladder with assistance 4-Minimal assistance 06/20/2017 1017 - Progressing by Glean Salen, RN RH OTHER STG BLADDER ELIMINATION GOALS W/ASSIST Description Other STG Bladder Elimination Goals With Assistance 06/20/2017 1831 - Progressing by Glean Salen, RN 06/20/2017 1017 - Progressing by Glean Salen, RN [facility] SKIN INTEGRITY RH STG SKIN FREE OF INFECTION/BREAKDOWN 06/20/2017 1831 - Progressing by Glean Salen, RN 06/20/2017 1017 - Progressing by Glean Salen, RN RH STG MAINTAIN SKIN INTEGRITY WITH ASSISTANCE Description STG Maintain Skin Integrity With Assistance. 06/20/2017 1831 - Progressing by Glean Salen, RN Flowsheets Taken 06/20/2017 1831  STG: Maintain skin integrity with assistance 5-Supervision/cueing 06/20/2017 1017 - Progressing by Glean Salen, RN Flowsheets Taken 06/20/2017 1017  STG: Maintain skin integrity with assistance 5-Supervision/cueing RH OTHER STG SKIN INTEGRITY GOALS W/ASSIST Description Other STG Skin Integrity Goals With Assistance. 06/20/2017 1831 - Progressing by Glean Salen, RN 06/20/2017 1017 - Progressing by Glean Salen, RN RH SAFETY RH STG ADHERE TO SAFETY PRECAUTIONS W/ASSISTANCE/DEVICE Description STG Adhere to Safety Precautions With Assistance/Device. 06/20/2017 1831 - Progressing by Glean Salen, RN Flowsheets Taken 06/20/2017 1831  STG:Pt will adhere to safety precautions with assistance/device 4-Minimal assistance 06/20/2017 1017 - Progressing by Glean Salen, RN Flowsheets Taken 06/20/2017  1017  STG:Pt will adhere to safety precautions with assistance/device 4-Minimal assistance RH STG DECREASED RISK OF FALL WITH ASSISTANCE Description STG Decreased Risk of Fall With Assistance. 06/20/2017 1831 -  Progressing by Glean Salen, RN Flowsheets Taken 06/20/2017 1831  WUJ:[id number] risk of fall  with assistance/device 3-Moderate assistance 06/20/2017 1017 - Progressing by Glean Salen, RN Flowsheets Taken 06/20/2017 1017  NFA:[id number] risk of fall  with assistance/device 4-Minimal assistance RH STG DEMO UNDERSTANDING HOME SAFETY PRECAUTIONS 06/20/2017 1831 - Progressing by Glean Salen, RN 06/20/2017 1017 - Progressing by Glean Salen, RN RH OTHER STG SAFETY GOALS W/ASSIST Description Other STG Safety Goals With Assistance. 06/20/2017 1831 - Progressing by Glean Salen, RN 06/20/2017 1017 - Progressing by Glean Salen, RN RH COGNITION-NURSING RH STG USES MEMORY AIDS/STRATEGIES W/ASSIST TO PROBLEM SOLVE Description STG Uses Memory Aids/Strategies With Assistance to Problem Solve. 06/20/2017 1831 - Progressing by Glean Salen, RN Flowsheets Taken 06/20/2017 1831  STG: Uses memory aids/strategies with assistance 4-Minimal assistance 06/20/2017 1017 - Progressing by Glean Salen, RN Flowsheets Taken 06/20/2017 1017  STG: Uses memory aids/strategies with assistance 4-Minimal assistance RH PAIN MANAGEMENT RH STG PAIN MANAGED AT OR BELOW PT'S PAIN GOAL 06/20/2017 1831 - Progressing by Glean Salen, RN 06/20/2017 1017 - Progressing by Glean Salen, RN RH OTHER STG PAIN MANAGEMENT GOALS W/ASSIST Description Other STG Pain Management Goals With Assistance. 06/20/2017 1831 - Progressing by Glean Salen, RN 06/20/2017 1017 - Progressing by Glean Salen, RN RH KNOWLEDGE DEFICIT RH STG INCREASE KNOWLEDGE OF DIABETES 06/20/2017 1831 - Progressing by Glean Salen, RN 06/20/2017 1017 - Progressing by Glean Salen, RN RH STG INCREASE KNOWLEDGE OF  HYPERTENSION 06/20/2017 1831 - Progressing by Glean Salen, RN 06/20/2017 1017 - Progressing by Glean Salen, RN RH STG INCREASE KNOWLEDGE OF DYSPHAGIA/FLUID INTAKE 06/20/2017 1831 - Progressing by Glean Salen, RN 06/20/2017 1017 - Progressing by Glean Salen, RN

## 2017-06-20 NOTE — Evaluation (Addendum)
Occupational Therapy Assessment and Plan  Patient Details  Name: NAYLEE FRANKOWSKI MRN: [id number] Date of Birth: 10-Apr-1945  OT Diagnosis: apraxia, ataxia, cognitive deficits, disturbance of vision, hemiplegia affecting non-dominant side and muscle weakness (generalized) Rehab Potential: Rehab Potential (ACUTE ONLY): Good ELOS: 3 weeks   Today's Date: 06/20/2017 OT Individual Time: 6789-3810 OT Individual Time Calculation (min): 60 min     Problem List:  Patient Active Problem List   Diagnosis Date Noted  . Stroke, acute, embolic ([place]) 17/51/0258  . Mitral regurgitation 06/17/2017  . Acute CVA (cerebrovascular accident) ([place])   . Thyroid nodule 06/15/2017  . Atrial fibrillation with RVR ([place]) 06/14/2017  . CVA (cerebral vascular accident) ([place]) 06/14/2017  . Type II diabetes mellitus ([place])   . Peripheral vascular disease ([place])   . Hyperlipidemia   . Hiatal hernia   . Edema   . Chronic diastolic (congestive) heart failure ([place])   . Carpal tunnel syndrome   . Atrial fibrillation ([place])   . Anemia   . Actinic keratosis   . Intractable nausea and vomiting 05/31/2016  . Nausea & vomiting 05/30/2016  . Abnormal chest x-ray 05/30/2016  . Diabetes mellitus with complication ([place])   . Influenza A 05/21/2016  . Chronic atrial fibrillation ([place]) 05/20/2016  . Acute CHF (congestive heart failure) ([place]) 05/20/2016  . History of GI bleed 03/29/2016  . Chronic anticoagulation 03/29/2016  . Symptomatic anemia 03/19/2016  . Spinal stenosis of lumbar region 02/10/2016  . Varicose veins of left leg with edema 09/15/2015  . Preop cardiovascular exam 08/25/2015  . Iron deficiency anemia due to chronic blood loss 07/17/2015  . Anemia of chronic renal failure, stage 3 (moderate) ([place]) 07/17/2015  . Anticoagulant causing adverse effect in therapeutic use 07/17/2015  . Symptomatic bradycardia 02/21/2015  . Bradycardia   . Acute deep vein thrombosis (DVT) of left lower extremity ([place]) 05/06/2014   . Abdominal wall mass of right lower quadrant 07/08/2013  . OA (osteoarthritis) of hip 04/03/2013  . Osteoarthritis of hip 01/18/2013  . Night sweats 09/06/2012  . Chest pain   . Anxiety and depression   . Gastroesophageal reflux disease   . Hepatitis   . Jaundice   . Hypertension 07/01/2010  . Hypercholesterolemia 07/01/2010  . DM2 (diabetes mellitus, type 2) ([place]) 07/01/2010  . Rheumatoid arthritis ([place]) 07/01/2010  . Sinus tachycardia 07/01/2010  . Chest pain, atypical 07/01/2010    Past Medical History:  Past Medical History:  Diagnosis Date  . Actinic keratosis   . Anemia    hx of  . Anemia of chronic renal failure, stage 3 (moderate) ([place]) 07/17/2015  . Anticoagulant causing adverse effect in therapeutic use 07/17/2015  . Atrial fibrillation ([place])    a. persistent, she remains on Xarelto  . Carpal tunnel syndrome   . Chronic diastolic (congestive) heart failure ([place])    a. 04/2016: echo showing a preserved EF of 55-60% with mild MR. LA and RA midly dilated.   . Edema    left leg at ankle resolved now  . Gastroesophageal reflux disease   . Hepatitis 1970   not sure what kind  . Hiatal hernia   . Hyperlipidemia   . Hypertension   . Iron deficiency anemia due to chronic blood loss 07/17/2015  . Jaundice    age 72  . Peripheral vascular disease ([place]) yrs ago   DVT left lower leg questionale told by 2 drs i had no clot, 1 md said i did  . Rheumatoid arthritis(714.0)   .  Right knee pain   . Type II diabetes mellitus ([name])    type2   Past Surgical History:  Past Surgical History:  Procedure Laterality Date  . CATARACT EXTRACTION Bilateral   . COLONOSCOPY WITH PROPOFOL N/A 02/20/2015   Procedure: COLONOSCOPY WITH PROPOFOL;  Surgeon: Carol Ada, MD;  Location: [facility];  Service: Endoscopy;  Laterality: N/A;  . ENTEROSCOPY N/A 03/23/2016   Procedure: ENTEROSCOPY;  Surgeon: Carol Ada, MD;  Location: [facility];  Service: Endoscopy;  Laterality: N/A;  .  ENTEROSCOPY N/A 06/17/2016   Procedure: ENTEROSCOPY;  Surgeon: Carol Ada, MD;  Location: [facility];  Service: Endoscopy;  Laterality: N/A;  . ESOPHAGOGASTRODUODENOSCOPY (EGD) WITH PROPOFOL N/A 02/20/2015   Procedure: ESOPHAGOGASTRODUODENOSCOPY (EGD) WITH PROPOFOL;  Surgeon: Carol Ada, MD;  Location: [facility];  Service: Endoscopy;  Laterality: N/A;  . GIVENS CAPSULE STUDY N/A 03/21/2016   Procedure: GIVENS CAPSULE STUDY;  Surgeon: Carol Ada, MD;  Location: [facility];  Service: Endoscopy;  Laterality: N/A;  . HAND SURGERY  1995 and 1996   artificial joints both hands  . JOINT REPLACEMENT    . LUMBAR LAMINECTOMY/DECOMPRESSION MICRODISCECTOMY N/A 02/10/2016   Procedure: MICROLUMBAR DECOMPRESSION L4-L5 AND L3- L4, AND EXCISION OF SYNOVIAL CYST L4-L5;  Surgeon: Susa Day, MD;  Location: [facility];  Service: Orthopedics;  Laterality: N/A;  . TOTAL HIP ARTHROPLASTY Left 2009  . TOTAL HIP ARTHROPLASTY Right 04/03/2013   Procedure: RIGHT TOTAL HIP ARTHROPLASTY ANTERIOR APPROACH;  Surgeon: Gearlean Alf, MD;  Location: [facility];  Service: Orthopedics;  Laterality: Right;    Assessment & Plan Clinical Impression: Zoraida L Rumleyis a 72 y.o.femalewith history of Afib, diabetes and RA who presented on 06/14/17 with headache and blurred vision after having stopped xarelto on 06/12/17 fornerve block.MRI revealed acute infarcts of the posterior right middle cerebral artery territory and the right posterior cerebral artery territory with petechial hemorrhage in the distribution of the right PCA infarct.Dr. Erlinda Hong felt that stroke embolic due to known A [id number] ECASA daily up due to size of stroke. To resume Xarelto in 7 days. Patient withongoingleft field cut with deficits invisual-spatial awareness and coordination, left inattention, significant cognitive deficits (MoCA 10/30) with lack of awareness of deficits and unsteady gait. CIR was recommended due to functional deficits.   Patient  transferred to CIR on 06/19/2017 .    Patient currently requires mod with basic self-care skills secondary to muscle weakness, decreased cardiorespiratoy endurance, motor apraxia, ataxia, decreased coordination and decreased motor planning, decreased visual acuity, decreased visual perceptual skills, decreased visual motor skills, field cut and hemianopsia, decreased attention to left, decreased initiation, decreased attention, decreased awareness, decreased problem solving, decreased safety awareness, decreased memory and delayed processing and decreased standing balance, decreased postural control, hemiplegia and decreased balance strategies.  Prior to hospitalization, patient could complete ADLs/IADLs with modified independent .  Patient will benefit from skilled intervention to decrease level of assist with basic self-care skills and increase independence with basic self-care skills prior to discharge home with care partner.  Anticipate patient will require 24 hour supervision and follow up home health.  OT - End of Session Activity Tolerance: Tolerates 10 - 20 min activity with multiple rests Endurance Deficit: Yes OT Assessment Rehab Potential (ACUTE ONLY): Good OT Barriers to Discharge: Decreased caregiver support OT Barriers to Discharge Comments: Family working to piece together 24 hr care OT Patient demonstrates impairments in the following area(s): Balance;Behavior;Perception;Cognition;Safety;Endurance;Motor;Vision OT Basic ADL's Functional Problem(s): Eating;Grooming;Bathing;Dressing;Toileting OT Transfers Functional Problem(s): Toilet;Tub/Shower OT Additional Impairment(s): None OT  Plan OT Intensity: Minimum of 1-2 x/day, 45 to 90 minutes OT Frequency: 5 out of 7 days OT Duration/Estimated Length of Stay: 2 weeks OT Treatment/Interventions: Balance/vestibular training;Discharge planning;Pain management;Self Care/advanced ADL retraining;Therapeutic Activities;UE/LE Coordination  activities;Cognitive remediation/compensation;Disease mangement/prevention;Functional mobility training;Patient/family education;Therapeutic Exercise;Visual/perceptual remediation/compensation;Community reintegration;DME/adaptive equipment instruction;Neuromuscular re-education;Psychosocial support;UE/LE Strength taining/ROM;Wheelchair propulsion/positioning OT Self Feeding Anticipated Outcome(s): set-up OT Basic Self-Care Anticipated Outcome(s): Supervision OT Toileting Anticipated Outcome(s): Supervision OT Bathroom Transfers Anticipated Outcome(s): Supervision OT Recommendation Recommendations for Other Services: Therapeutic Recreation consult Therapeutic Recreation Interventions: Kitchen group;Stress management Patient destination: Home Follow Up Recommendations: Home health OT;24 hour supervision/assistance Equipment Recommended: To be determined   Skilled Therapeutic Intervention Pt een for OT eval and ADL bathingdresing session. Pt in upine upon arrival with RN present collecting vitals. PT transferred to EOB with mod A, multimodal cuing for sequencing/ technique.  Mod A to stand from EOB and max cuing for sequencing and initiation of stand pivot transfer to w/c and from w/c to tub bench in shower using grab bar. She bathed seated on tub transfer bench, able to use B UEs in functional manner and pt attending to all body parts independently.  She returned to w/c to dress, required assist for clothing orientation secondary to perceptual deficits.  Pt left seated in w/c at end of session,QRB donned, set-up with breakfast tray (with cuing for head turn pt able to locate all items on tray), and all needs in reach. Education provided throughout session regarding role of OT, POC, OT goals, and d/c planning.   OT Evaluation Precautions/Restrictions  Precautions Precautions: Fall Precaution Comments: L visual field cut, R knee buckles Restrictions Weight Bearing Restrictions:  No General Chart Reviewed: Yes Pain Pain Assessment Pain Assessment: No/denies pain Pain Score: 0-No pain Home Living/Prior Functioning Home Living Available Help at Discharge: Family, Friend(s), Available PRN/intermittently(female roommate works; Dance movement psychotherapist together 24hr care) Type of Home: Mobile home Home Access: Stairs to enter Technical brewer of Steps: 3 Entrance Stairs-Rails: Right, Left, Can reach both Home Layout: One level Bathroom Shower/Tub: Chiropodist: Handicapped height Bathroom Accessibility: Yes Additional Comments: uses rollator the most at home  Lives With: Significant other(72 y.o. female friend) IADL History Homemaking Responsibilities: Yes Meal Prep Responsibility: Primary Mode of Transportation: Car, Friends(Pt's dignificant other provided transportation within community) Occupation: Retired Leisure and Hobbies: Gardening, Medical laboratory scientific officer Prior Function Level of Independence: Requires assistive device for independence, Independent with transfers  Able to Take Stairs?: Yes Driving: No Comments: uses Agricultural engineer Baseline Vision/History: Wears glasses Wears Glasses: At all times Patient Visual Report: No change from baseline Vision Assessment?: Yes;Vision impaired- to be further tested in functional context Eye Alignment: Impaired (comment) Ocular Range of Motion: Restricted on the right;Restricted on the left Alignment/Gaze Preference: Head tilt Tracking/Visual Pursuits: Right eye does not track laterally;Left eye does not track laterally;Impaired - to be further tested in functional context;Unable to hold eye position out of midline Saccades: Impaired - to be further tested in functional context Convergence: Impaired (comment) Visual Fields: Left visual field deficit;Left homonymous hemianopsia Perception  Perception: Impaired Inattention/Neglect: Does not attend to left visual field Praxis Praxis: Impaired Praxis Impairment  Details: Initiation Cognition Overall Cognitive Status: Impaired/Different from baseline Arousal/Alertness: Awake/alert Orientation Level: Person;Place;Situation Person: Oriented Place: Oriented Situation: Oriented Year: 2019 Month: March Day of Week: Incorrect Memory: Impaired Memory Impairment: Decreased recall of new information;Storage deficit;Decreased short term memory Decreased Short Term Memory: Verbal basic;Functional basic Immediate Memory Recall: Blue;Bed;Sock Memory Recall: Sock;Blue;Bed Memory Recall Sock: Without Cue Memory Recall Blue: Without  Cue Memory Recall Bed: Without Cue Attention: Sustained;Selective Sustained Attention: Impaired Sustained Attention Impairment: Verbal basic;Functional basic Selective Attention: Impaired Selective Attention Impairment: Verbal basic;Functional basic Awareness: Impaired Awareness Impairment: Intellectual impairment Problem Solving: Impaired Problem Solving Impairment: Functional basic;Verbal basic Sequencing: Impaired Sequencing Impairment: Verbal basic;Functional basic Organizing: Impaired Organizing Impairment: Verbal basic;Functional basic Initiating: Impaired Initiating Impairment: Verbal basic;Functional basic Self Monitoring: Impaired Self Monitoring Impairment: Functional basic Self Correcting: Impaired Behaviors: Impulsive Safety/Judgment: Impaired Comments: Decreased safety awarenss and decreased awareness of deficits Sensation Sensation Light Touch: Appears Intact Proprioception: Appears Intact Coordination Gross Motor Movements are Fluid and Coordinated: No Fine Motor Movements are Fluid and Coordinated: No(due to hx of Rheumatoid arthritis) Finger Nose Finger Test: Decreased speed and accuracy 2/2 decreased attention. Unable to locate target L of midline Motor  Motor Motor: Hemiplegia Motor - Skilled Clinical Observations: minimal L hemiplegia Trunk/Postural Assessment  Cervical Assessment Cervical  Assessment: Exceptions to WFL(Forward head) Thoracic Assessment Thoracic Assessment: Exceptions to WFL(Kyphotic) Lumbar Assessment Lumbar Assessment: Exceptions to WFL(Posterior pelvic tilt) Postural Control Postural Control: Within Functional Limits  Balance Balance Balance Assessed: Yes Static Sitting Balance Static Sitting - Balance Support: Feet supported Static Sitting - Level of Assistance: 5: Stand by assistance Static Sitting - Comment/# of Minutes: Sitting EOB Dynamic Sitting Balance Dynamic Sitting - Balance Support: During functional activity;Feet supported Dynamic Sitting - Level of Assistance: 5: Stand by assistance;4: Min assist Sitting balance - Comments: Sitting to complete bathing/dressing Static Standing Balance Static Standing - Balance Support: During functional activity;Right upper extremity supported;Left upper extremity supported Static Standing - Level of Assistance: 4: Min assist;3: Mod assist Static Standing - Comment/# of Minutes: Standing at sink Dynamic Standing Balance Dynamic Standing - Balance Support: No upper extremity supported;During functional activity;Right upper extremity supported;Left upper extremity supported Dynamic Standing - Level of Assistance: 4: Min assist;3: Mod assist Dynamic Standing - Comments: Standing to complete bathing/dressing Extremity/Trunk Assessment RUE Assessment RUE Assessment: (impaired grasp 2/2 rheumatoid arthritis) LUE Assessment LUE Assessment: Exceptions to WFL(4/5 throughout; impaired grasp 2/2 rheumatoid arthritis)   See Function Navigator for Current Functional Status.   Refer to Care Plan for Long Term Goals  Recommendations for other services: Therapeutic Recreation  Kitchen group, Stress management and Outing/community reintegration   Discharge Criteria: Patient will be discharged from OT if patient refuses treatment 3 consecutive times without medical reason, if treatment goals not met, if there is a  change in medical status, if patient makes no progress towards goals or if patient is discharged from hospital.  The above assessment, treatment plan, treatment alternatives and goals were discussed and mutually agreed upon: by patient  Meliton Samad L 06/20/2017, 9:27 AM
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
## 2017-06-20 NOTE — Progress Notes (Signed)
Subjective/Complaints: Eating Breakfast, ignores Left side of  Plate, nodding off but awakens to voice ROS:  Denies CP,SOB, N/V/D Objective: Vital Signs: Blood pressure 127/78, pulse 67, temperature 98.4 F (36.9 C), temperature source Oral, resp. rate 18, height '5\' 7"'  (1.702 m), SpO2 97 %. No results found. Results for orders placed or performed during the hospital encounter of 06/14/17 (from the past 72 hour(s))  Glucose, capillary     Status: Abnormal   Collection Time: 06/17/17  8:03 AM  Result Value Ref Range   Glucose-Capillary 152 (H) 65 - 99 mg/dL  Glucose, capillary     Status: Abnormal   Collection Time: 06/17/17 11:48 AM  Result Value Ref Range   Glucose-Capillary 158 (H) 65 - 99 mg/dL  Glucose, capillary     Status: Abnormal   Collection Time: 06/17/17  4:44 PM  Result Value Ref Range   Glucose-Capillary 250 (H) 65 - 99 mg/dL  Glucose, capillary     Status: Abnormal   Collection Time: 06/17/17  9:59 PM  Result Value Ref Range   Glucose-Capillary 190 (H) 65 - 99 mg/dL  Basic metabolic panel     Status: Abnormal   Collection Time: 06/18/17  3:50 AM  Result Value Ref Range   Sodium 140 135 - 145 mmol/L   Potassium 3.6 3.5 - 5.1 mmol/L   Chloride 110 101 - 111 mmol/L   CO2 20 (L) 22 - 32 mmol/L   Glucose, Bld 176 (H) 65 - 99 mg/dL   BUN 22 (H) 6 - 20 mg/dL   Creatinine, Ser 1.07 (H) 0.44 - 1.00 mg/dL   Calcium 9.2 8.9 - 10.3 mg/dL   GFR calc non Af Amer 51 (L) >60 mL/min   GFR calc Af Amer 59 (L) >60 mL/min    Comment: (NOTE) The eGFR has been calculated using the CKD EPI equation. This calculation has not been validated in all clinical situations. eGFR's persistently <60 mL/min signify possible Chronic Kidney Disease.    Anion gap 10 5 - 15    Comment: Performed at [facility] [address] [place], [place] [address]  CBC     Status: Abnormal   Collection Time: 06/18/17  3:50 AM  Result Value Ref Range   WBC 10.0 4.0 - 10.5 K/uL   RBC 4.68 3.87 -  5.11 MIL/uL   Hemoglobin 10.9 (L) 12.0 - 15.0 g/dL   HCT 36.7 36.0 - 46.0 %   MCV 78.4 78.0 - 100.0 fL   MCH 23.3 (L) 26.0 - 34.0 pg   MCHC 29.7 (L) 30.0 - 36.0 g/dL   RDW 17.5 (H) 11.5 - 15.5 %   Platelets 237 150 - 400 K/uL    Comment: Performed at [facility], [place] [address], [place], [place] [address]  Glucose, capillary     Status: Abnormal   Collection Time: 06/18/17  7:54 AM  Result Value Ref Range   Glucose-Capillary 169 (H) 65 - 99 mg/dL  Glucose, capillary     Status: Abnormal   Collection Time: 06/18/17 12:23 PM  Result Value Ref Range   Glucose-Capillary 221 (H) 65 - 99 mg/dL  Glucose, capillary     Status: Abnormal   Collection Time: 06/18/17  5:47 PM  Result Value Ref Range   Glucose-Capillary 185 (H) 65 - 99 mg/dL  Glucose, capillary     Status: Abnormal   Collection Time: 06/18/17  9:26 PM  Result Value Ref Range   Glucose-Capillary 220 (H) 65 -  99 mg/dL   Comment 1 Notify RN    Comment 2 Document in Chart   Glucose, capillary     Status: Abnormal   Collection Time: 06/19/17  8:00 AM  Result Value Ref Range   Glucose-Capillary 149 (H) 65 - 99 mg/dL  Glucose, capillary     Status: Abnormal   Collection Time: 06/19/17 12:07 PM  Result Value Ref Range   Glucose-Capillary 185 (H) 65 - 99 mg/dL  Basic metabolic panel     Status: Abnormal   Collection Time: 06/19/17 12:24 PM  Result Value Ref Range   Sodium 142 135 - 145 mmol/L   Potassium 3.6 3.5 - 5.1 mmol/L   Chloride 109 101 - 111 mmol/L   CO2 21 (L) 22 - 32 mmol/L   Glucose, Bld 241 (H) 65 - 99 mg/dL   BUN 18 6 - 20 mg/dL   Creatinine, Ser 0.79 0.44 - 1.00 mg/dL   Calcium 8.9 8.9 - 10.3 mg/dL   GFR calc non Af Amer >60 >60 mL/min   GFR calc Af Amer >60 >60 mL/min    Comment: (NOTE) The eGFR has been calculated using the CKD EPI equation. This calculation has not been validated in all clinical situations. eGFR's persistently <60 mL/min signify possible Chronic Kidney Disease.    Anion gap 12  5 - 15    Comment: Performed at [facility] [address] [place],  [address]  CBC     Status: Abnormal   Collection Time: 06/19/17 12:24 PM  Result Value Ref Range   WBC 9.3 4.0 - 10.5 K/uL   RBC 4.77 3.87 - 5.11 MIL/uL   Hemoglobin 11.1 (L) 12.0 - 15.0 g/dL   HCT 37.4 36.0 - 46.0 %   MCV 78.4 78.0 - 100.0 fL   MCH 23.3 (L) 26.0 - 34.0 pg   MCHC 29.7 (L) 30.0 - 36.0 g/dL   RDW 17.5 (H) 11.5 - 15.5 %   Platelets 255 150 - 400 K/uL    Comment: Performed at [facility] [address], [place], [place] [address]  Glucose, capillary     Status: Abnormal   Collection Time: 06/19/17  4:03 PM  Result Value Ref Range   Glucose-Capillary 174 (H) 65 - 99 mg/dL     HEENT: normal Cardio: RRR and no murmur Resp: CTA B/L and unlabored GI: BS positive and Non Tender and Non distended Extremity:  No Edema Skin:   Intact Neuro: Lethargic, Normal Sensory, Normal Motor, Inattention and Other left homonymous hemianopsia Musc/Skel:  Other Ulnar drift bilateral MCPs, Bouchard's deformity Right index finger Gen nad   Assessment/Plan: 1. Functional deficits secondary to Right PCA/post MCA infarct which require 3+ hours per day of interdisciplinary therapy in a comprehensive inpatient rehab setting. Physiatrist is providing close team supervision and 24 hour management of active medical problems listed below. Physiatrist and rehab team continue to assess barriers to discharge/monitor patient progress toward functional and medical goals. FIM:       Function - Toileting Toileting steps completed by patient: Adjust clothing prior to toileting, Adjust clothing after toileting Toileting steps completed by helper: Performs perineal hygiene Toileting Assistive Devices: Grab bar or rail Assist level: Touching or steadying assistance (Pt.75%)  Function - Toilet Transfers Toilet transfer assistive device: Elevated toilet seat/BSC over toilet                        Medical Problem List and Plan: 1.Functional  and visual-spatial deficitssecondary to right PCA/MCA infarct -CIR PT, OT,SLP evals today nODDING OFF, MAY NEED NEUROSTIMULANT- although this may cause RVR 2. DVT Prophylaxis/Anticoagulation: Will start Lovenox 40 mg daily.  3.Chronic LBP/Right knee pain/Pain Management:tylenol prn and local measures. Continue to hold Norco and gabapentin (used prn) 4. Mood:LCSW to follow for evaluation and support.Continue Wellbutrin bid 5. Neuropsych: This patientis not fullycapable of making decisions onherown behalf. 6. Skin/Wound Care:routine pressure relief measures 7. Fluids/Electrolytes/Nutrition:Monitor I/O. Check lytes in am.  8. A fib: Monitor HR bid. Continue Metoprolol bid with Cardizem daily. .  9. RA: managed with Arava and low dose prednisone.  10. GERD: On Protonix.  11.Hgb A1C- 6.9: T2DM: Monitor BS ac/hs. Resume Amaryl as po intake is good--continue to hold metformin for now.  CBG (last 3)  Recent Labs    06/19/17 0800 06/19/17 1207 06/19/17 1603  GLUCAP 149* 185* 174*   12. JXF:FKVQOHCO    LOS (Days) 1 A FACE TO FACE EVALUATION WAS PERFORMED  Charlett Blake 06/20/2017, 6:06 AM

## 2017-06-20 NOTE — Progress Notes (Signed)
Physical Medicine and Rehabilitation Consult Reason for Consult:impaired balance and vision Referring Physician: Family medicine   HPI: [name] is a 72 y.o. female with history of Afib, diabetes and RA who presented on 06/14/17 with headache and blurred vision after having stopped xarelto on 06/12/17 for a planned procedure.  MRI revealed acute infarcts of the posterior right middle cerebral artery territory and the right posterior cerebral artery territory with petechial hemorrhage in the distribution of the right PCA infarct. Source felt to be embolic. Anticoagulation being held until 3/21 with 325mg  ECASA daily up until then. Pt has demonstrated ongoing deficits in visual-spatial awareness and coordination. PM&R was consulted to assess for potential inpatient rehab needs.    Review of Systems  Constitutional: Negative for fever.  HENT: Negative for hearing loss.   Eyes: Positive for double vision.  Respiratory: Negative for cough.   Cardiovascular: Negative for chest pain.  Gastrointestinal: Negative for heartburn.  Genitourinary: Negative for urgency.  Musculoskeletal: Negative for neck pain.  Skin: Negative for rash.  Neurological: Positive for focal weakness and headaches.  Psychiatric/Behavioral: Negative for depression.       Past Medical History:  Diagnosis Date  . Actinic keratosis   . Anemia    hx of  . Anemia of chronic renal failure, stage 3 (moderate) ([place]) 07/17/2015  . Anticoagulant causing adverse effect in therapeutic use 07/17/2015  . Atrial fibrillation ([place])    a. persistent, she remains on Xarelto  . Carpal tunnel syndrome   . Chronic diastolic (congestive) heart failure ([place])    a. 04/2016: echo showing a preserved EF of 55-60% with mild MR. LA and RA midly dilated.   . Edema    left leg at ankle resolved now  . Gastroesophageal reflux disease   . Hepatitis 1970   not sure what kind  . Hiatal hernia   . Hyperlipidemia   .  Hypertension   . Iron deficiency anemia due to chronic blood loss 07/17/2015  . Jaundice    age 20  . Peripheral vascular disease ([place]) yrs ago   DVT left lower leg questionale told by 2 drs i had no clot, 1 md said i did  . Rheumatoid arthritis(714.0)   . Type II diabetes mellitus ([place])    type2        Past Surgical History:  Procedure Laterality Date  . CATARACT EXTRACTION Bilateral   . COLONOSCOPY WITH PROPOFOL N/A 02/20/2015   Procedure: COLONOSCOPY WITH PROPOFOL;  Surgeon: Carol Ada, MD;  Location: [facility];  Service: Endoscopy;  Laterality: N/A;  . ENTEROSCOPY N/A 03/23/2016   Procedure: ENTEROSCOPY;  Surgeon: Carol Ada, MD;  Location: [facility];  Service: Endoscopy;  Laterality: N/A;  . ENTEROSCOPY N/A 06/17/2016   Procedure: ENTEROSCOPY;  Surgeon: Carol Ada, MD;  Location: [facility];  Service: Endoscopy;  Laterality: N/A;  . ESOPHAGOGASTRODUODENOSCOPY (EGD) WITH PROPOFOL N/A 02/20/2015   Procedure: ESOPHAGOGASTRODUODENOSCOPY (EGD) WITH PROPOFOL;  Surgeon: Carol Ada, MD;  Location: [facility];  Service: Endoscopy;  Laterality: N/A;  . GIVENS CAPSULE STUDY N/A 03/21/2016   Procedure: GIVENS CAPSULE STUDY;  Surgeon: Carol Ada, MD;  Location: [facility];  Service: Endoscopy;  Laterality: N/A;  . HAND SURGERY  1995 and 1996   artificial joints both hands  . JOINT REPLACEMENT    . LUMBAR LAMINECTOMY/DECOMPRESSION MICRODISCECTOMY N/A 02/10/2016   Procedure: MICROLUMBAR DECOMPRESSION L4-L5 AND L3- L4, AND EXCISION OF SYNOVIAL CYST L4-L5;  Surgeon: Susa Day, MD;  Location: [facility];  Service: Orthopedics;  Laterality: N/A;  . TOTAL HIP ARTHROPLASTY Left 2009  . TOTAL HIP ARTHROPLASTY Right 04/03/2013   Procedure: RIGHT TOTAL HIP ARTHROPLASTY ANTERIOR APPROACH;  Surgeon: Gearlean Alf, MD;  Location: [facility];  Service: Orthopedics;  Laterality: Right;   Family History  Problem Relation Age of Onset  . Stroke Father   . Heart failure  Sister   . Hypertension Sister   . Heart attack Neg Hx    Social History:  reports that  has never smoked. she has never used smokeless tobacco. She reports that she does not drink alcohol or use drugs. Allergies: No Known Allergies       Medications Prior to Admission  Medication Sig Dispense Refill  . Abatacept (ORENCIA CLICKJECT) 371 MG/ML SOAJ Inject 125 mg into the skin every Thursday.     [name] buPROPion (WELLBUTRIN SR) 150 MG 12 hr tablet Take 150 mg by mouth 2 (two) times daily.      . Cholecalciferol (VITAMIN D3) 1000 units CAPS Take 1,000 Units by mouth every evening.     . Cyanocobalamin (VITAMIN B 12 PO) Take 1,000 mcg by mouth every evening.     . diltiazem (CARDIZEM CD) 360 MG 24 hr capsule TAKE 1 CAPSULE (360 MG TOTAL) BY MOUTH DAILY. 90 capsule 3  . esomeprazole (NEXIUM) 40 MG capsule Take 40 mg by mouth every morning.     . ferrous gluconate (FERGON) 324 MG tablet Take 1 tablet (324 mg total) by mouth 2 (two) times daily with a meal. 60 tablet 3  . furosemide (LASIX) 20 MG tablet Take 2 tablets (40 mg total) by mouth daily. Take 2 tablets by mouth daily. TAKE AN ADDITIONAL 1 TAB AS NEEDED 90 tablet 5  . gabapentin (NEURONTIN) 300 MG capsule Take 300 mg by mouth 2 (two) times daily as needed for pain.  1  . glimepiride (AMARYL) 2 MG tablet Take 2 mg by mouth daily.     [name] leflunomide (ARAVA) 20 MG tablet Take 20 mg by mouth daily.    . metFORMIN (GLUCOPHAGE-XR) 500 MG 24 hr tablet Take 1,000 mg by mouth 2 (two) times daily.    . metoprolol tartrate (LOPRESSOR) 50 MG tablet TAKE 2 TABLETS (100 MG TOTAL) BY MOUTH 2 (TWO) TIMES DAILY. 120 tablet 11  . Multiple Vitamins-Minerals (PRESERVISION AREDS 2) CAPS Take 1 capsule by mouth 2 (two) times daily.    . nitroGLYCERIN (NITROSTAT) 0.4 MG SL tablet Place 1 tablet (0.4 mg total) under the tongue every 5 (five) minutes as needed for chest pain (x 3 doses). 25 tablet 2  . potassium chloride (KLOR-CON 10) 10 MEQ  tablet Take 2 tablets (20 mEq total) 2 (two) times daily by mouth. 360 tablet 3  . predniSONE (DELTASONE) 5 MG tablet Take 5 mg by mouth daily.    . rivaroxaban (XARELTO) 20 MG TABS tablet Take 20 mg by mouth daily with supper.    . rosuvastatin (CRESTOR) 10 MG tablet Take 10 mg by mouth every evening.     [name] VOLTAREN 1 % GEL Apply 2 g topically at bedtime as needed for pain. Knees, calf  2  . HYDROcodone-acetaminophen (NORCO/VICODIN) 5-325 MG tablet Take 1 tablet by mouth every 6 (six) hours as needed for pain.  0    Home: Home Living Family/patient expects to be discharged to:: Private residence Living Arrangements: Spouse/significant other Available Help at Discharge: Family Type of Home: House Home Access: Stairs to enter [employer] of Steps: 3 Entrance Stairs-Rails: Right,  Left, Can reach both Home Layout: One level Bathroom Shower/Tub: Chiropodist: Handicapped height Bathroom Accessibility: Yes Home Equipment: Environmental consultant - 4 wheels, [place] - single point Additional Comments: uses rollator the most at home  Functional History: Prior Function Level of Independence: Independent with assistive device(s) Comments: uses Rollator Functional Status:  Mobility: Bed Mobility Overal bed mobility: Needs Assistance Bed Mobility: Sit to Supine Rolling: Supervision Supine to sit: Supervision Sit to supine: Min assist General bed mobility comments: HOB elevated, used bed rail Transfers Overall transfer level: Needs assistance Equipment used: 1 person hand held assist Transfers: Sit to/from Stand Sit to Stand: Min assist General transfer comment: Min assist to power up and steady.  Ambulation/Gait Ambulation/Gait assistance: Min assist Ambulation Distance (Feet): 15 Feet(x2) Assistive device: Rolling walker (2 wheeled) Gait Pattern/deviations: Step-through pattern, Decreased stride length, Shuffle, Wide base of support General Gait Details: pt  very unsteady, wide stance with minimal foot clearance, pt very rigid due to fear. pt with h/o 5 falls in last month Gait velocity: slow Gait velocity interpretation: Below normal speed for age/gender  ADL: ADL Overall ADL's : Needs assistance/impaired Eating/Feeding: Minimal assistance, Sitting Eating/Feeding Details (indicate cue type and reason): max cues and anchoring techniques to attend to L side of tray Grooming: Min guard, Standing Grooming Details (indicate cue type and reason): max cues to locate hot water faucet and sequence to turn on water Upper Body Bathing: Minimal assistance, Sitting Lower Body Bathing: Min guard, Sit to/from stand Upper Body Dressing : Minimal assistance, Sitting Lower Body Dressing: Min guard, Sit to/from stand Toilet Transfer: Ambulation, RW, Cueing for sequencing, Cueing for safety, Moderate assistance Toilet Transfer Details (indicate cue type and reason): cues and physical assistance to navigate as unable to attend to or visualize L side of environment Functional mobility during ADLs: Minimal assistance, Rolling walker General ADL Comments: Significant assistance to navigate due to running into items on the L. Unable to read her cell phone and only sees R side of screen.   Cognition: Cognition Overall Cognitive Status: Impaired/Different from baseline Orientation Level: Oriented X4(with confusion) Cognition Arousal/Alertness: Awake/alert Behavior During Therapy: WFL for tasks assessed/performed Overall Cognitive Status: Impaired/Different from baseline Area of Impairment: Attention, Following commands, Safety/judgement, Awareness, Problem solving, Memory Current Attention Level: Selective Memory: Decreased short-term memory Following Commands: Follows one step commands with increased time, Follows multi-step commands inconsistently Safety/Judgement: Decreased awareness of safety, Decreased awareness of deficits(L sided neglect) Awareness:  Emergent Problem Solving: Slow processing, Difficulty sequencing, Requires verbal cues, Requires tactile cues General Comments: pt with L hemiopnopsia, unaware of L sided neglect.  Blood pressure 104/83, pulse 79, temperature 98.2 F (36.8 C), temperature source Oral, resp. rate 17, height 5\' 7"  (1.702 m), weight 77.6 kg (171 lb 1.2 oz), SpO2 95 %. Physical Exam  Constitutional: She appears well-developed.  HENT:  Head: Normocephalic.  Eyes: Pupils are equal, round, and reactive to light.  Neck: Normal range of motion.  Cardiovascular: Normal rate.  Respiratory: Effort normal.  GI: Soft.  Neurological:  Pt with left HH. ?decreased [place] RUE and RLE> LUE/LLE although inconsistent with participation.   Psychiatric:  Irritable and impulsive  Assessment/Plan: Diagnosis: Right PCA/MCA infarct 1. Does the need for close, 24 hr/day medical supervision in concert with the patient's rehab needs make it unreasonable for this patient to be served in a less intensive setting? Potentially 2. Co-Morbidities requiring supervision/potential complications: afib, DM, htn, RA 3. Due to bladder management, bowel management, safety, skin/wound care, disease management, medication administration  and patient education, does the patient require 24 hr/day rehab nursing? Yes and Potentially 4. Does the patient require coordinated care of a physician, rehab nurse, PT (1-2 hrs/day, 5 days/week) and OT (1-2 hrs/day, 5 days/week), potential SLP as well, to address physical and functional deficits in the context of the above medical diagnosis(es)? Yes and Potentially Addressing deficits in the following areas: balance, endurance, locomotion, strength, transferring, bowel/bladder control, bathing, dressing, feeding, grooming, toileting and psychosocial support, cognition 5. Can the patient actively participate in an intensive therapy program of at least 3 hrs of therapy per day at least 5 days per week? Yes and  Potentially 6. The potential for patient to make measurable gains while on inpatient rehab is good and fair 7. Anticipated functional outcomes upon discharge from inpatient rehab are modified independent and supervision  with PT, modified independent and supervision with OT, modified independent with SLP. 8. Estimated rehab length of stay to reach the above functional goals is: potentially 7-10 days 9. Anticipated D/C setting: Home 10. Anticipated post D/C treatments: HH therapy and Outpatient therapy 11. Overall Rehab/Functional Prognosis: good  RECOMMENDATIONS: This patient's condition is appropriate for continued rehabilitative care in the following setting: potentially CIR Patient has agreed to participate in recommended program. No and Potentially Note that insurance prior authorization may be required for reimbursement for recommended care.  Comment: Pt insists that she's going home. Husband lives with her and can provide assistance. She could benefit from a brief stay on inpatient rehab to address visual-spatial deficits, balance, safety. Rehab Admissions Coordinator to follow up.  Thanks,  Meredith Staggers, MD, Mellody Drown    Meredith Staggers, MD 06/18/2017           Routing History

## 2017-06-20 NOTE — Progress Notes (Signed)
PMR Admission Coordinator Pre-Admission Assessment  Patient: [name] is an 72 y.o., female MRN: [id number] DOB: 08-20-1945 Height: 5\' 7"  (170.2 cm) Weight: 77.6 kg (171 lb 1.2 oz)                                                                                                                                                  Insurance Information HMO:     PPO:      PCP:      IPA:      80/20:      OTHER:  PRIMARY: Medicare A & B      Policy#: [id number]      Subscriber: Self CM Name:       Phone#:      Fax#:  Pre-Cert#: Eligible       Employer: Retired  Benefits:  Phone #: Verified online     Name: [employer]  Eff. Date: 03/04/96     Deduct: $1364      Out of Pocket Max: N/A      Life Max: N/A CIR: 100%      SNF: 1005 days 1-20, 80% days 21-100 Outpatient: 80%     Co-Pay: 20% Home Health: 100%      Co-Pay: $0 DME: 80%     Co-Pay: 20% Providers: Patient's Choice   SECONDARY: Medicaid of [place]      Policy#: [id number]      Subscriber: Self CM Name:       Phone#:      Fax#:  Pre-Cert#: Coverage code: [place]      Employer: Retired  Benefits:  Phone #: [phone]     Name: Verified via automated system  Eff. Date: Eligible as of 06/19/17     Deduct:       Out of Pocket Max:       Life Max:  CIR:       SNF:  Outpatient:      Co-Pay:  Home Health:       Co-Pay:  DME:      Co-Pay:   Medicaid Application Date:       Case Manager:  Disability Application Date:       Case Worker:   Emergency Contact Information        Contact Information    Name Relation Home Work Mobile   [place] Son  [phone]   [place] Elks Significant other [phone]  [phone]   Kellie Shropshire [phone]  [phone]     Current Medical History  Patient Admitting Diagnosis: Right PCA/MCA infarct  History of Present Illness: Tessah L Rumleyis a 72 y.o.femalewith history of Afib, diabetes, and RA who presented on 06/14/17 with headache and blurred  vision after having stopped xarelto on 06/12/17 fornerve block.MRI revealed acute infarcts of the posterior right middle cerebral artery territory and the right posterior  cerebral artery territory with petechial hemorrhage in the distribution of the right PCA infarct.Dr. Erlinda Hong felt that stroke embolic due to known Afib--on325mg  ECASA daily up due to size of stroke. To resume Xarelto in 7 days. Patient withongoingleft field cut with deficits invisual-spatial awareness and coordination, left inattention, significant cognitive deficits (MoCA 10/30) with lack of awareness of deficits, and unsteady gait. CIR was recommended due to functional deficits and patient admitted 06/19/17.  NIH Total: 5  Past Medical History      Past Medical History:  Diagnosis Date  . Actinic keratosis   . Anemia    hx of  . Anemia of chronic renal failure, stage 3 (moderate) ([place]) 07/17/2015  . Anticoagulant causing adverse effect in therapeutic use 07/17/2015  . Atrial fibrillation ([place])    a. persistent, she remains on Xarelto  . Carpal tunnel syndrome   . Chronic diastolic (congestive) heart failure ([place])    a. 04/2016: echo showing a preserved EF of 55-60% with mild MR. LA and RA midly dilated.   . Edema    left leg at ankle resolved now  . Gastroesophageal reflux disease   . Hepatitis 1970   not sure what kind  . Hiatal hernia   . Hyperlipidemia   . Hypertension   . Iron deficiency anemia due to chronic blood loss 07/17/2015  . Jaundice    age 65  . Peripheral vascular disease ([place]) yrs ago   DVT left lower leg questionale told by 2 drs i had no clot, 1 md said i did  . Rheumatoid arthritis(714.0)   . Right knee pain   . Type II diabetes mellitus (HCC)    type2    Family History  family history includes Heart failure in her sister; Hypertension in her sister; Stroke in her father.  Prior Rehab/Hospitalizations:  Has the patient had major surgery during 100 days prior to  admission? No  Current Medications   Current Facility-Administered Medications:  .  acetaminophen (TYLENOL) tablet 650 mg, 650 mg, Oral, Q4H PRN, 650 mg at 06/15/17 0751 **OR** acetaminophen (TYLENOL) solution 650 mg, 650 mg, Per Tube, Q4H PRN **OR** acetaminophen (TYLENOL) suppository 650 mg, 650 mg, Rectal, Q4H PRN, Shirley, [place], DO .  aspirin tablet 325 mg, 325 mg, Oral, Daily, Bland, Scott, DO, 325 mg at 06/19/17 1032 .  baclofen (LIORESAL) tablet 5 mg, 5 mg, Oral, TID, Lovenia Kim, MD, 5 mg at 06/19/17 1033 .  bethanechol (URECHOLINE) tablet 5 mg, 5 mg, Oral, TID, Bland, Scott, DO, 5 mg at 06/19/17 1032 .  buPROPion (WELLBUTRIN SR) 12 hr tablet 150 mg, 150 mg, Oral, BID, Enid Derry, [place], DO, 150 mg at 06/19/17 1033 .  diltiazem (CARDIZEM CD) 24 hr capsule 360 mg, 360 mg, Oral, Daily, Abraham, Sherin, DO, 360 mg at 06/19/17 1032 .  insulin aspart (novoLOG) injection 0-9 Units, 0-9 Units, Subcutaneous, TID WC, Shirley, [place], DO, 2 Units at 06/19/17 1236 .  leflunomide (ARAVA) tablet 20 mg, 20 mg, Oral, Daily, Enid Derry, [place], DO, 20 mg at 06/19/17 1033 .  metoprolol tartrate (LOPRESSOR) tablet 100 mg, 100 mg, Oral, BID, Tammi Klippel, Sherin, DO, 100 mg at 06/19/17 1032 .  pantoprazole (PROTONIX) EC tablet 40 mg, 40 mg, Oral, Daily, Enid Derry, [place], DO, 40 mg at 06/19/17 1032 .  predniSONE (DELTASONE) tablet 5 mg, 5 mg, Oral, Daily, Enid Derry, [place], DO, 5 mg at 06/19/17 1033 .  rosuvastatin (CRESTOR) tablet 40 mg, 40 mg, Oral, QPM, Lovenia Kim, MD, 40 mg at 06/18/17 1812 .  senna-docusate (  Senokot-S) tablet 1 tablet, 1 tablet, Oral, QHS PRN, Shirley, [place], DO  Patients Current Diet: Fall precautions Diet heart healthy/carb modified Room service appropriate? Yes; Fluid consistency: Thin  Precautions / Restrictions Precautions Precautions: Fall Precaution Comments: severe L sided neglect and L hemiopnosia Restrictions Weight Bearing Restrictions: No   Has the patient had 2 or  more falls or a fall with injury in the past year?Yes  Prior Activity Level Limited Community (1-2x/wk): Prior to admission patient's significant other, Grayland Ormond would drive to shop or run arrands.  Her son, Fara Olden would assist with trnasport to medical appointments at times.  Two weeks prior to admission patient had started falling at home; however, prior to this she was fully independent at home with use of a Rollator walker.    Home Assistive Devices / Equipment Home Assistive Devices/Equipment: Cane (specify quad or straight), Walker (specify type) Home Equipment: Walker - 4 wheels, Cane - single point  Prior Device Use: Indicate devices/aids used by the patient prior to current illness, exacerbation or injury? Walker  Prior Functional Level Prior Function Level of [facility]: Independent with assistive device(s) Comments: uses Rollator  Self Care: Did the patient need help bathing, dressing, using the toilet or eating? Independent  Indoor Mobility: Did the patient need assistance with walking from room to room (with or without device)? Independent  Stairs: Did the patient need assistance with internal or external stairs (with or without device)? Independent  Functional Cognition: Did the patient need help planning regular tasks such as shopping or remembering to take medications? Independent  Current Functional Level Cognition  Arousal/Alertness: Awake/alert Overall Cognitive Status: Impaired/Different from baseline Current Attention Level: Selective Orientation Level: Oriented X4 Following Commands: Follows one step commands with increased time, Follows multi-step commands inconsistently Safety/Judgement: Decreased awareness of safety, Decreased awareness of deficits(L sided neglect) General Comments: pt with L hemiopnopsia, unaware of L sided neglect. Attention: Sustained, Selective Sustained Attention: Impaired Sustained Attention Impairment: Verbal basic, Functional  basic(refocusing, verbal cues in conversation and cognitive tasks) Selective Attention: Impaired Selective Attention Impairment: Verbal basic, Functional basic Memory: Impaired Memory Impairment: Decreased recall of new information, Storage deficit, Decreased short term memory(attention impacts) Decreased Short Term Memory: Verbal basic, Functional basic Awareness: Impaired Awareness Impairment: Intellectual impairment Problem Solving: Impaired Problem Solving Impairment: Functional basic(0/3 ways to make $13) Executive Function: Sequencing, Organizing, Initiating, Self Correcting, Self Monitoring Sequencing: Impaired Sequencing Impairment: Verbal basic, Functional basic Organizing: Impaired Organizing Impairment: Verbal basic, Functional basic Initiating: Impaired Initiating Impairment: Verbal basic, Functional basic(cues for task initiation and to respond to simple questions) Self Monitoring: Impaired Self Monitoring Impairment: Functional basic Self Correcting: Impaired Self Correcting Impairment: Functional basic Behaviors: Poor frustration tolerance Safety/Judgment: Impaired    Extremity Assessment (includes Sensation/Coordination)  Upper Extremity Assessment: Generalized weakness  Lower Extremity Assessment: Generalized weakness    ADLs  Overall ADL's : Needs assistance/impaired Eating/Feeding: Minimal assistance, Sitting Eating/Feeding Details (indicate cue type and reason): max cues and anchoring techniques to attend to L side of tray Grooming: Min guard, Standing Grooming Details (indicate cue type and reason): max cues to locate hot water faucet and sequence to turn on water Upper Body Bathing: Minimal assistance, Sitting Lower Body Bathing: Min guard, Sit to/from stand Upper Body Dressing : Minimal assistance, Sitting Lower Body Dressing: Min guard, Sit to/from stand Toilet Transfer: Ambulation, RW, Cueing for sequencing, Cueing for safety, Moderate  assistance Toilet Transfer Details (indicate cue type and reason): cues and physical assistance to navigate as unable to attend to or visualize  L side of environment Functional mobility during ADLs: Minimal assistance, Rolling walker General ADL Comments: Significant assistance to navigate due to running into items on the L. Unable to read her cell phone and only sees R side of screen.     Mobility  Overal bed mobility: Needs Assistance Bed Mobility: Sit to Supine Rolling: Supervision Supine to sit: Supervision Sit to supine: Min assist General bed mobility comments: HOB elevated, used bed rail    Transfers  Overall transfer level: Needs assistance Equipment used: 1 person hand held assist Transfers: Sit to/from Stand Sit to Stand: Min assist General transfer comment: Min assist to power up and steady.     Ambulation / Gait / Stairs / Wheelchair Mobility  Ambulation/Gait Ambulation/Gait assistance: Museum/gallery curator (Feet): 15 Feet(x2) Assistive device: Rolling walker (2 wheeled) Gait Pattern/deviations: Step-through pattern, Decreased stride length, Shuffle, Wide base of support General Gait Details: pt very unsteady, wide stance with minimal foot clearance, pt very rigid due to fear. pt with h/o 5 falls in last month Gait velocity: slow Gait velocity interpretation: Below normal speed for age/gender    Posture / Balance Dynamic Sitting Balance Sitting balance - Comments: seated on EOB on entry without supervision Balance Overall balance assessment: Needs assistance Sitting-balance support: No upper extremity supported, Feet unsupported Sitting balance-Leahy Scale: Good Sitting balance - Comments: seated on EOB on entry without supervision Standing balance support: Bilateral upper extremity supported, During functional activity Standing balance-Leahy Scale: Poor Standing balance comment: dependent on UE support    Special needs/care consideration  BiPAP/CPAP: No CPM: No Continuous Drip IV: No Dialysis: No         Life Vest: No Oxygen: No Special Bed: No Trach Size: No Wound Vac (area): No       Skin: Dry, Moisture associated skin damage to groin area                               Bowel mgmt: Continent, last BM 06/18/17 Bladder mgmt: Continent  Diabetic mgmt: Yes, managed with oral medications prior to admission.  Patient also reports that she tries to stay away from sweet things     Previous Home Environment Living Arrangements: Spouse/significant other Available Help at Discharge: Family Type of Home: House Home Layout: One level Home Access: Stairs to enter Entrance Stairs-Rails: Right, Left, Can reach both Entrance Stairs-Number of Steps: 3 Bathroom Shower/Tub: Chiropodist: Handicapped height Bathroom Accessibility: Yes How Accessible: Accessible via walker Bootjack: Yes Type of Home Care Services: Homehealth aide, Home RN Additional Comments: uses rollator the most at home  Discharge Living Setting Plans for Discharge Living Setting: Lives with (comment)(Significant other, Grayland Ormond ) Type of Home at Discharge: Mobile home Discharge Home Layout: One level Discharge Home Access: Stairs to enter Entrance Stairs-Rails: Can reach both Entrance Stairs-Number of Steps: 3, then 1  Discharge Bathroom Shower/Tub: Tub/shower unit(seat in the tub) Discharge Bathroom Toilet: Standard Discharge Bathroom Accessibility: No Does the patient have any problems obtaining your medications?: No  Social/Family/Support Systems Patient Roles: Partner, Parent Contact Information: Maxwell Caul [phone] Anticipated Caregiver: Son, son's girlfriend, significant other  Anticipated Caregiver's Contact Information: Son to coordinate, see above Ability/Limitations of Caregiver: Son works out of town some Careers adviser: 24/7 Discharge Plan Discussed with Primary Caregiver: Yes Is Caregiver  In Agreement with Plan?: Yes Does Caregiver/Family have Issues with Lodging/Transportation while Pt is in Rehab?: No  Goals/Additional Needs  Patient/Family Goal for Rehab: PT/OT: Mod I-Supervision; SLP: Mod I  Expected length of stay: 7-10 days  Cultural Considerations: None Dietary Needs: Heart Healthy, Carb. Mod. diet restrictions. Equipment Needs: TBD Special Service Needs: Pt. with impaired insight into current deficits  Additional Information: Son reports that he and his girlfriend will be assisting at D/C Pt/Family Agrees to Admission and willing to participate: Yes Program Orientation Provided & Reviewed with Pt/Caregiver Including Roles  & Responsibilities: Yes  Decrease burden of Care through IP rehab admission: No  Possible need for SNF placement upon discharge: Not anticipated   Patient Condition: This patient's medical and functional status has changed since the consult dated 06/18/17 at 9:10 AM in which the Rehabilitation Physician determined and documented that the patient was potentially appropriate for intensive rehabilitative care in an inpatient rehabilitation facility. Issues have been addressed and update has been discussed with Dr. Naaman Plummer and patient now appropriate for inpatient rehabilitation. Will admit to inpatient rehab today.   Preadmission Screen Completed By:  Gunnar Fusi, 06/19/2017 1:29 PM ______________________________________________________________________   Discussed status with Dr. Naaman Plummer on 06/19/17 at 1340 and received telephone approval for admission today.  Admission Coordinator:  Gunnar Fusi, time 1340/Date 06/19/17             Cosigned by: Meredith Staggers, MD at 06/19/2017 2:14 PM  Revision History

## 2017-06-20 NOTE — Care Management Note (Signed)
Inpatient [facility] Statement of Services  Patient Name:  [name]  Date:  06/20/2017  Welcome to the [facility].  Our goal is to provide you with an individualized program based on your diagnosis and situation, designed to meet your specific needs.  With this comprehensive rehabilitation program, you will be expected to participate in at least 3 hours of rehabilitation therapies Monday-Friday, with modified therapy programming on the weekends.  Your rehabilitation program will include the following services:  Physical Therapy (PT), Occupational Therapy (OT), Speech Therapy (ST), 24 hour per day rehabilitation nursing, Therapeutic Recreaction (TR), Case Management (Social Worker), Rehabilitation Medicine, Nutrition Services and Pharmacy Services  Weekly team conferences will be held on Wednesday to discuss your progress.  Your Social Worker will talk with you frequently to get your input and to update you on team discussions.  Team conferences with you and your family in attendance may also be held.  Expected length of stay: 2-3 weeks  Overall anticipated outcome: supervision-min-ambulation  Depending on your progress and recovery, your program may change. Your Social Worker will coordinate services and will keep you informed of any changes. Your Social Worker's name and contact numbers are listed  below.  The following services may also be recommended but are not provided by the [facility]:   [name] will be made to provide these services after discharge if needed.  Arrangements include referral to agencies that provide these services.  Your insurance has been verified to be:  Medicare & Medicaid Your primary doctor is:  [facility]  Pertinent information will be shared with your doctor and your insurance company.  Social Worker:   Ovidio Kin, [place] or ([phone]  Information discussed with and copy given to patient by: Elease Hashimoto, 06/20/2017, 12:09 PM

## 2017-06-20 NOTE — Plan of Care (Signed)
  Progressing RH BOWEL ELIMINATION RH STG MANAGE BOWEL WITH ASSISTANCE Description STG Manage Bowel with Assistance. 06/20/2017 0503 - Progressing by Evelena Asa, RN RH BLADDER ELIMINATION RH STG MANAGE BLADDER WITH ASSISTANCE Description STG Manage Bladder With Assistance 06/20/2017 0503 - Progressing by Evelena Asa, RN [facility] SKIN INTEGRITY RH STG SKIN FREE OF INFECTION/BREAKDOWN 06/20/2017 0503 - Progressing by Evelena Asa, RN RH PAIN MANAGEMENT RH STG PAIN MANAGED AT OR BELOW PT'S PAIN GOAL 06/20/2017 0503 - Progressing by Evelena Asa, RN

## 2017-06-20 NOTE — Progress Notes (Signed)
Social Work  Social Work Assessment and Plan  Patient Details  Name: [name] MRN: [id number] Date of Birth: 1945/04/26  Today's Date: 06/20/2017  Problem List:  Patient Active Problem List   Diagnosis Date Noted  . Stroke, acute, embolic ([place]) 95/63/8756  . Mitral regurgitation 06/17/2017  . Acute CVA (cerebrovascular accident) ([place])   . Thyroid nodule 06/15/2017  . Atrial fibrillation with RVR ([place]) 06/14/2017  . CVA (cerebral vascular accident) ([place]) 06/14/2017  . Type II diabetes mellitus ([place])   . Peripheral vascular disease ([place])   . Hyperlipidemia   . Hiatal hernia   . Edema   . Chronic diastolic (congestive) heart failure ([place])   . Carpal tunnel syndrome   . Atrial fibrillation ([place])   . Anemia   . Actinic keratosis   . Intractable nausea and vomiting 05/31/2016  . Nausea & vomiting 05/30/2016  . Abnormal chest x-ray 05/30/2016  . Diabetes mellitus with complication ([place])   . Influenza A 05/21/2016  . Chronic atrial fibrillation ([place]) 05/20/2016  . Acute CHF (congestive heart failure) ([place]) 05/20/2016  . History of GI bleed 03/29/2016  . Chronic anticoagulation 03/29/2016  . Symptomatic anemia 03/19/2016  . Spinal stenosis of lumbar region 02/10/2016  . Varicose veins of left leg with edema 09/15/2015  . Preop cardiovascular exam 08/25/2015  . Iron deficiency anemia due to chronic blood loss 07/17/2015  . Anemia of chronic renal failure, stage 3 (moderate) ([place]) 07/17/2015  . Anticoagulant causing adverse effect in therapeutic use 07/17/2015  . Symptomatic bradycardia 02/21/2015  . Bradycardia   . Acute deep vein thrombosis (DVT) of left lower extremity ([place]) 05/06/2014  . Abdominal wall mass of right lower quadrant 07/08/2013  . OA (osteoarthritis) of hip 04/03/2013  . Osteoarthritis of hip 01/18/2013  . Night sweats 09/06/2012  . Chest pain   . Anxiety and depression   . Gastroesophageal reflux disease   . Hepatitis   . Jaundice   . Hypertension  07/01/2010  . Hypercholesterolemia 07/01/2010  . DM2 (diabetes mellitus, type 2) ([place]) 07/01/2010  . Rheumatoid arthritis ([place]) 07/01/2010  . Sinus tachycardia 07/01/2010  . Chest pain, atypical 07/01/2010   Past Medical History:  Past Medical History:  Diagnosis Date  . Actinic keratosis   . Anemia    hx of  . Anemia of chronic renal failure, stage 3 (moderate) ([place]) 07/17/2015  . Anticoagulant causing adverse effect in therapeutic use 07/17/2015  . Atrial fibrillation ([place])    a. persistent, she remains on Xarelto  . Carpal tunnel syndrome   . Chronic diastolic (congestive) heart failure ([place])    a. 04/2016: echo showing a preserved EF of 55-60% with mild MR. LA and RA midly dilated.   . Edema    left leg at ankle resolved now  . Gastroesophageal reflux disease   . Hepatitis 1970   not sure what kind  . Hiatal hernia   . Hyperlipidemia   . Hypertension   . Iron deficiency anemia due to chronic blood loss 07/17/2015  . Jaundice    age 72  . Peripheral vascular disease ([place]) yrs ago   DVT left lower leg questionale told by 2 drs i had no clot, 1 md said i did  . Rheumatoid arthritis(714.0)   . Right knee pain   . Type II diabetes mellitus ([place])    type2   Past Surgical History:  Past Surgical History:  Procedure Laterality Date  . CATARACT EXTRACTION Bilateral   . COLONOSCOPY WITH PROPOFOL  N/A 02/20/2015   Procedure: COLONOSCOPY WITH PROPOFOL;  Surgeon: Carol Ada, MD;  [facility];  Service: Endoscopy;  Laterality: N/A;  . ENTEROSCOPY N/A 03/23/2016   Procedure: ENTEROSCOPY;  Surgeon: Carol Ada, MD;  [facility];  Service: Endoscopy;  Laterality: N/A;  . ENTEROSCOPY N/A 06/17/2016   Procedure: ENTEROSCOPY;  Surgeon: Carol Ada, MD;  [facility];  Service: Endoscopy;  Laterality: N/A;  . ESOPHAGOGASTRODUODENOSCOPY (EGD) WITH PROPOFOL N/A 02/20/2015   Procedure: ESOPHAGOGASTRODUODENOSCOPY (EGD) WITH PROPOFOL;  Surgeon: Carol Ada,  MD;  [facility];  Service: Endoscopy;  Laterality: N/A;  . GIVENS CAPSULE STUDY N/A 03/21/2016   Procedure: GIVENS CAPSULE STUDY;  Surgeon: Carol Ada, MD;  [facility];  Service: Endoscopy;  Laterality: N/A;  . HAND SURGERY  1995 and 1996   artificial joints both hands  . JOINT REPLACEMENT    . LUMBAR LAMINECTOMY/DECOMPRESSION MICRODISCECTOMY N/A 02/10/2016   Procedure: MICROLUMBAR DECOMPRESSION L4-L5 AND L3- L4, AND EXCISION OF SYNOVIAL CYST L4-L5;  Surgeon: Susa Day, MD;  [facility];  Service: Orthopedics;  Laterality: N/A;  . TOTAL HIP ARTHROPLASTY Left 2009  . TOTAL HIP ARTHROPLASTY Right 04/03/2013   Procedure: RIGHT TOTAL HIP ARTHROPLASTY ANTERIOR APPROACH;  Surgeon: Gearlean Alf, MD;  [facility];  Service: Orthopedics;  Laterality: Right;   Social History:  reports that  has never smoked. she has never used smokeless tobacco. She reports that she does not drink alcohol or use drugs.  Family / Support Systems Marital Status: Divorced Patient Roles: Partner, Parent Spouse/Significant Other: [name] Children: [name] Other Supports: Son's girlfriend  Anticipated Caregiver: Son, his girlfriend and [name] Ability/Limitations of Caregiver: Son works out of town and [name] has health issues-age 63  Caregiver Availability: 24/7 Family Dynamics: Close with family, has been with [name] for over 30 years. He does much for her and does the home management and the driving. Pt feels she has good supports via family and [name].   Social History Preferred language: English Religion: Methodist Cultural Background: No issues Education: [employer] Read: Yes Write: Yes Employment Status: Retired Freight forwarder Issues: No issues Guardian/Conservator: None-according to MD pt is not fully capable of making her own decisions while here, will look toward her son since she is  not married to [place]. If any need to be made while here.   Abuse/Neglect Abuse/Neglect Assessment Can Be Completed: Yes Physical Abuse: Denies Verbal Abuse: Denies Sexual Abuse: Denies Exploitation of patient/patient's resources: Denies Self-Neglect: Denies  Emotional Status Pt's affect, behavior adn adjustment status: Pt is motivated to do well and has good movement in her arms and legs. She reports her vision and balance has been thrown off, along with her bad knee that gives out on her at times. She wants to be able to move around with her rollator at home like she did before this. Recent Psychosocial Issues: other health issues-bad knee and back issues-chronic Pyschiatric History: History of anxiety-takes medication for this and finds it helpful. Would benefit from seeing neuro-psych while here when he returns from vacation. Will get input from team and make referral. Substance Abuse History: No issues  Patient / Family Perceptions, Expectations & Goals Pt/Family understanding of illness & functional limitations: Pt and [name] can explain her stroke and deficits, he plans to be here often and see her progress. Both have spoken with the MD and feel they have a good understanding of her stroke and treatment plan going forward. Premorbid pt/family  roles/activities: Girlfriend, mom, grandmother, retiree, etc Anticipated changes in roles/activities/participation: resume Pt/family expectations/goals: Pt states: " I want to be able to move around and not be afraid my knee will give out."  [name] states: " I hope she does well here, but will help her if I can."  [employer]: Other (Comment) Premorbid Home Care/DME Agencies: Other (Comment)([facility] has been seeing her for [facility] and [place]) Transportation available at discharge: [name] and [employer] referrals recommended: Neuropsychology, Support group (specify)  Discharge Planning Living Arrangements:  Spouse/significant other Support Systems: Spouse/significant other, Children, Friends/neighbors, Other relatives Type of Residence: Private residence [employer] Resources: [employer], [employer] (specify county) Pensions consultant: Radio broadcast assistant Screen Referred: No Living Expenses: Own Money Management: Patient, Significant Other Does the patient have any problems obtaining your medications?: No Home Management: [name] does the home management Patient/Family Preliminary Plans: Return home with [name] with Fara Olden corrdinating the services and care. He is very involved in her care and wants to be sure she has what she needs. Will complete PCS paperwork for aide when discharged for added services. Await team's evaluations. [place] Barriers to Discharge: Decreased caregiver support [place] Barriers to Discharge Comments: [name] has health issues of his own Social Work Anticipated Follow Up Needs: HH/OP, Support Group  Clinical Impression Pleasant female who is really doing well considering all of her health issues. Hopefully team can help her feel more confident in her ambulation or get her a knee brace for support. She was falling prior to admission and now has a fear of falling. Supportive involved family who will assist her at discharge. Await team's evaluations and work on a safe discharge plan. Will complete PCS forms so pt eligible for an aide at home for more services in the home at discharge and will resume [facility] services also.  Elease Hashimoto 06/20/2017, 2:44 PM

## 2017-06-20 NOTE — Evaluation (Signed)
Physical Therapy Assessment and Plan  Patient Details  Name: [name] MRN: [id number] Date of Birth: 1946/03/14  PT Diagnosis: Abnormal posture, Abnormality of gait, Coordination disorder, Difficulty walking, Hemiplegia, Impaired cognition and Muscle weakness Rehab Potential: Fair ELOS: 3 weeks   Today's Date: 06/20/2017 PT Individual Time: 2952-8413 PT Individual Time Calculation (min): 68 min    Problem List:  Patient Active Problem List   Diagnosis Date Noted  . Stroke, acute, embolic ([place]) 24/40/1027  . Mitral regurgitation 06/17/2017  . Acute CVA (cerebrovascular accident) ([place])   . Thyroid nodule 06/15/2017  . Atrial fibrillation with RVR ([place]) 06/14/2017  . CVA (cerebral vascular accident) ([place]) 06/14/2017  . Type II diabetes mellitus ([place])   . Peripheral vascular disease ([place])   . Hyperlipidemia   . Hiatal hernia   . Edema   . Chronic diastolic (congestive) heart failure ([place])   . Carpal tunnel syndrome   . Atrial fibrillation ([place])   . Anemia   . Actinic keratosis   . Intractable nausea and vomiting 05/31/2016  . Nausea & vomiting 05/30/2016  . Abnormal chest x-ray 05/30/2016  . Diabetes mellitus with complication ([place])   . Influenza A 05/21/2016  . Chronic atrial fibrillation ([place]) 05/20/2016  . Acute CHF (congestive heart failure) ([place]) 05/20/2016  . History of GI bleed 03/29/2016  . Chronic anticoagulation 03/29/2016  . Symptomatic anemia 03/19/2016  . Spinal stenosis of lumbar region 02/10/2016  . Varicose veins of left leg with edema 09/15/2015  . Preop cardiovascular exam 08/25/2015  . Iron deficiency anemia due to chronic blood loss 07/17/2015  . Anemia of chronic renal failure, stage 3 (moderate) ([place]) 07/17/2015  . Anticoagulant causing adverse effect in therapeutic use 07/17/2015  . Symptomatic bradycardia 02/21/2015  . Bradycardia   . Acute deep vein thrombosis (DVT) of left lower extremity ([place]) 05/06/2014  . Abdominal wall mass of right  lower quadrant 07/08/2013  . OA (osteoarthritis) of hip 04/03/2013  . Osteoarthritis of hip 01/18/2013  . Night sweats 09/06/2012  . Chest pain   . Anxiety and depression   . Gastroesophageal reflux disease   . Hepatitis   . Jaundice   . Hypertension 07/01/2010  . Hypercholesterolemia 07/01/2010  . DM2 (diabetes mellitus, type 2) ([place]) 07/01/2010  . Rheumatoid arthritis ([place]) 07/01/2010  . Sinus tachycardia 07/01/2010  . Chest pain, atypical 07/01/2010    Past Medical History:  Past Medical History:  Diagnosis Date  . Actinic keratosis   . Anemia    hx of  . Anemia of chronic renal failure, stage 3 (moderate) ([place]) 07/17/2015  . Anticoagulant causing adverse effect in therapeutic use 07/17/2015  . Atrial fibrillation ([place])    a. persistent, she remains on Xarelto  . Carpal tunnel syndrome   . Chronic diastolic (congestive) heart failure ([place])    a. 04/2016: echo showing a preserved EF of 55-60% with mild MR. LA and RA midly dilated.   . Edema    left leg at ankle resolved now  . Gastroesophageal reflux disease   . Hepatitis 1970   not sure what kind  . Hiatal hernia   . Hyperlipidemia   . Hypertension   . Iron deficiency anemia due to chronic blood loss 07/17/2015  . Jaundice    age 51  . Peripheral vascular disease ([place]) yrs ago   DVT left lower leg questionale told by 2 drs i had no clot, 1 md said i did  . Rheumatoid arthritis(714.0)   . Right knee pain   .  Type II diabetes mellitus ([place])    type2   Past Surgical History:  Past Surgical History:  Procedure Laterality Date  . CATARACT EXTRACTION Bilateral   . COLONOSCOPY WITH PROPOFOL N/A 02/20/2015   Procedure: COLONOSCOPY WITH PROPOFOL;  Surgeon: Carol Ada, MD;  [facility];  Service: Endoscopy;  Laterality: N/A;  . ENTEROSCOPY N/A 03/23/2016   Procedure: ENTEROSCOPY;  Surgeon: Carol Ada, MD;  [facility];  Service: Endoscopy;  Laterality: N/A;  . ENTEROSCOPY N/A 06/17/2016    Procedure: ENTEROSCOPY;  Surgeon: Carol Ada, MD;  [facility];  Service: Endoscopy;  Laterality: N/A;  . ESOPHAGOGASTRODUODENOSCOPY (EGD) WITH PROPOFOL N/A 02/20/2015   Procedure: ESOPHAGOGASTRODUODENOSCOPY (EGD) WITH PROPOFOL;  Surgeon: Carol Ada, MD;  [facility];  Service: Endoscopy;  Laterality: N/A;  . GIVENS CAPSULE STUDY N/A 03/21/2016   Procedure: GIVENS CAPSULE STUDY;  Surgeon: Carol Ada, MD;  [facility];  Service: Endoscopy;  Laterality: N/A;  . HAND SURGERY  1995 and 1996   artificial joints both hands  . JOINT REPLACEMENT    . LUMBAR LAMINECTOMY/DECOMPRESSION MICRODISCECTOMY N/A 02/10/2016   Procedure: MICROLUMBAR DECOMPRESSION L4-L5 AND L3- L4, AND EXCISION OF SYNOVIAL CYST L4-L5;  Surgeon: Susa Day, MD;  [facility];  Service: Orthopedics;  Laterality: N/A;  . TOTAL HIP ARTHROPLASTY Left 2009  . TOTAL HIP ARTHROPLASTY Right 04/03/2013   Procedure: RIGHT TOTAL HIP ARTHROPLASTY ANTERIOR APPROACH;  Surgeon: Gearlean Alf, MD;  [facility];  Service: Orthopedics;  Laterality: Right;    Assessment & Plan Clinical Impression: Patient is a 72 y.o. year old female with history of Afib, diabetes and RA who presented on 06/14/17 with headache and blurred vision after having stopped xarelto on 06/12/17 fornerve block.MRI revealed acute infarcts of the posterior right middle cerebral artery territory and the right posterior cerebral artery territory with petechial hemorrhage in the distribution of the right PCA infarct.Dr. Erlinda Hong felt that stroke embolic due to known A Fib--on'325mg'$  ECASA daily up due to size of stroke. To resume Xarelto in 7 days. Patient withongoingleft field cut with deficits invisual-spatial awareness and coordination, left inattention, significant cognitive deficits (MoCA 10/30) with lack of awareness of deficits and unsteady gait. CIR was recommended due to functional deficits. Patient transferred to CIR on  06/19/2017 .   Patient currently requires total with mobility secondary to muscle weakness, decreased cardiorespiratoy endurance, decreased coordination, decreased visual perceptual skills, decreased attention to left and left side neglect, decreased initiation, decreased attention, decreased awareness, decreased problem solving, decreased safety awareness, decreased memory and delayed processing, and decreased sitting balance, decreased standing balance, decreased postural control, hemiplegia and decreased balance strategies.  Prior to hospitalization, patient was modified independent  with mobility and lived with Spouse(Kenny) in a House home.  Home access is 3Stairs to enter.  Patient will benefit from skilled PT intervention to maximize safe functional mobility, minimize fall risk and decrease caregiver burden for planned discharge home with 24 hour assist.  Anticipate patient will HHPT vs SNF at discharge.  PT - End of Session Activity Tolerance: Decreased this session Endurance Deficit: Yes PT Assessment Rehab Potential (ACUTE/IP ONLY): Fair PT Barriers to Discharge: Behavior PT Barriers to Discharge Comments: significantly impaired cognition, unsure of family support at d/c PT Patient demonstrates impairments in the following area(s): Balance;Safety;Behavior;Endurance;Motor;Perception PT Transfers Functional Problem(s): Bed Mobility;Bed to Chair;Car;Furniture PT Locomotion Functional Problem(s): Wheelchair Mobility;Ambulation;Stairs PT Plan PT Intensity: Minimum of 1-2 x/day ,45 to 90 minutes PT Frequency: 5 out of 7 days PT  Duration Estimated Length of Stay: 3 weeks PT Treatment/Interventions: Ambulation/gait training;DME/adaptive equipment instruction;Community reintegration;Neuromuscular re-education;Psychosocial support;Stair training;UE/LE Strength taining/ROM;Wheelchair propulsion/positioning;UE/LE Coordination activities;Therapeutic Activities;Pain management;Skin care/wound  management;Discharge planning;Balance/vestibular training;Cognitive remediation/compensation;Disease management/prevention;Functional mobility training;Patient/family education;Splinting/orthotics;Therapeutic Exercise;Visual/perceptual remediation/compensation PT Transfers Anticipated Outcome(s): min assist with LRAD PT Locomotion Anticipated Outcome(s): min assist with LRAD PT Recommendation Recommendations for Other Services: Speech consult Follow Up Recommendations: 24 hour supervision/assistance(HHPT vs SNF) Patient destination: (Home VS SNF) Equipment Recommended: To be determined Equipment Details: pt already has w/c with ELR from acute care  Skilled Therapeutic Intervention Pt received in w/c & agreeable to tx. At beginning of session pt able to recall she is in hospital but at end of session pt requires choice of 2 and selects incorrectly. Pt providing inconsistent PLOF information therefore most information obtained from chart. Pt demonstrates very significantly impaired cognition with attention to task of <5 seconds. Pt also with L inattention to visual field. Pt transported to ortho gym via w/c total assist and completes w/c>car at equal height with mod/max assist but requires +2 total assist for car>w/c. Attempted to assist pt out of car ~30 minutes as pt with very poor attention to task and when attending reports fear of falling. Pt unable to follow commands for hand placement & sequencing. In hallway pt ambulates 12 ft with 3 muskateers assist & w/c follow for safety; please see description of gait below. Pt returned to room & BP assessed: 116/61 mmHg (LUE, sitting), HR = 63 bpm. Pt falling asleep in w/c upon return to room despite therapist conversing with her. When awakened pt reports she is not sleepy/tired. RN entered room & made aware of pt's difficulty following commands during session & current lethargy. Pt left sitting in w/c with QRB & chair alarm donned, needs within reach &  nursing staff present.   PT Evaluation Precautions/Restrictions Precautions Precautions: Fall Precaution Comments: L inattention, L hemi, very fearful of falling Restrictions Weight Bearing Restrictions: No  General Chart Reviewed: Yes Additional Pertinent History: a-fib, DM, RA, chronic diastolic HF, hepatitis, HLD, HTN, PVD Response to Previous Treatment: Patient unable to report, no changes reported from family or staff Family/Caregiver Present: No   Home Living/Prior Functioning Home Living Living Arrangements: Spouse/significant other Available Help at Discharge: Family(unsure of availability, has son that lives in the area) Type of Home: House Home Access: Stairs to enter Technical brewer of Steps: 3 Entrance Stairs-Rails: Can reach both;Right;Left Home Layout: One level  Lives With: common law Spouse(Kenny) Prior Function Level of Independence: Independent with basic ADLs;Independent with transfers;Independent with gait;Requires assistive device for independence(per chart pt Mod I with rollator PTA)  Able to Take Stairs?: Yes(per chart) Driving: No(per SLP)  Vision/Perception  Wears glasses at baseline. Perception Inattention/Neglect: Does not attend to left visual field   Cognition Overall Cognitive Status: Impaired/Different from baseline Arousal/Alertness: Awake/alert Orientation Level: Oriented to person(inconsistently oriented to place) Attention: Focused;Sustained Focused Attention: Impaired Focused Attention Impairment: Functional basic;Verbal basic Sustained Attention: Impaired Sustained Attention Impairment: Functional basic;Verbal basic Memory: Impaired Memory Impairment: Decreased short term memory;Decreased long term memory;Decreased recall of new information Awareness: Impaired Awareness Impairment: Intellectual impairment Problem Solving: Impaired Problem Solving Impairment: Verbal basic;Functional basic Initiating: Impaired Initiating  Impairment: Functional basic Safety/Judgment: Impaired  Glass blower/designer - Skilled Clinical Observations: L hemi, generalized weakness   Mobility Transfers Transfers: Yes Sit to Stand: 3: Mod assist;With armrests(with rail in hallway) Sit to Stand Details: Manual facilitation for placement;Manual facilitation for weight shifting;Tactile cues for sequencing;Tactile cues for weight shifting;Tactile cues  for initiation  Locomotion  Ambulation Ambulation: Yes Ambulation/Gait Assistance: (3 muskateers with w/c follow for safety) Ambulation Distance (Feet): 12 Feet Gait Gait: (decreased step length LLE, decreased stride length, impaired weight shifting L<>R, decreased gait speed) Stairs / Additional Locomotion Stairs: No Wheelchair Mobility Wheelchair Mobility: No    See Function Navigator for Current Functional Status.   Refer to Care Plan for Long Term Goals  Recommendations for other services: None   Discharge Criteria: Patient will be discharged from PT if patient refuses treatment 3 consecutive times without medical reason, if treatment goals not met, if there is a change in medical status, if patient makes no progress towards goals or if patient is discharged from hospital.  The above assessment, treatment plan, treatment alternatives and goals were discussed and mutually agreed upon: No family available/patient unable  [place] 06/20/2017, 5:12 PM

## 2017-06-20 NOTE — Plan of Care (Signed)
Progressing Consults RH STROKE PATIENT EDUCATION Description See Patient Education module for education specifics  06/20/2017 1017 - Progressing by Glean Salen, RN Nutrition Consult-if indicated 06/20/2017 1017 - Progressing by Glean Salen, RN Diabetes Guidelines if Diabetic/Glucose > 140 Description If diabetic or lab glucose is > 140 mg/dl - Initiate Diabetes/Hyperglycemia Guidelines & Document Interventions  06/20/2017 1017 - Progressing by Glean Salen, RN RH BOWEL ELIMINATION RH STG MANAGE BOWEL WITH ASSISTANCE Description STG Manage Bowel with Assistance. 06/20/2017 1017 - Progressing by Glean Salen, RN Flowsheets Taken 06/20/2017 1017  STG: Pt will manage bowels with assistance 6-Modified independent RH STG MANAGE BOWEL W/MEDICATION W/ASSISTANCE Description STG Manage Bowel with Medication with Assistance. 06/20/2017 1017 - Progressing by Glean Salen, RN Flowsheets Taken 06/20/2017 1017  STG: Pt will manage bowels with medication with assistance 6-Modified independent RH STG MANAGE BOWEL W/EQUIPMENT W/ASSISTANCE Description STG Manage Bowel With Equipment With Assistance 06/20/2017 1017 - Progressing by Glean Salen, RN Flowsheets Taken 06/20/2017 1017  STG: Pt will manage bowels with equipment with assistance 6-Modified independent RH OTHER STG BOWEL ELIMINATION GOALS W/ASSIST Description Other STG Bowel Elimination Goals With Assistance. 06/20/2017 1017 - Progressing by Glean Salen, RN RH BLADDER ELIMINATION RH STG MANAGE BLADDER WITH ASSISTANCE Description STG Manage Bladder With Assistance 06/20/2017 1017 - Progressing by Glean Salen, RN RH OTHER STG BLADDER ELIMINATION GOALS W/ASSIST Description Other STG Bladder Elimination Goals With Assistance 06/20/2017 1017 - Progressing by Glean Salen, RN RH SKIN INTEGRITY RH STG SKIN FREE OF INFECTION/BREAKDOWN 06/20/2017 1017 - Progressing by Glean Salen, RN RH STG MAINTAIN SKIN INTEGRITY WITH  ASSISTANCE Description STG Maintain Skin Integrity With Assistance. 06/20/2017 1017 - Progressing by Glean Salen, RN Flowsheets Taken 06/20/2017 1017  STG: Maintain skin integrity with assistance 5-Supervision/cueing RH OTHER STG SKIN INTEGRITY GOALS W/ASSIST Description Other STG Skin Integrity Goals With Assistance. 06/20/2017 1017 - Progressing by Glean Salen, RN RH SAFETY RH STG ADHERE TO SAFETY PRECAUTIONS W/ASSISTANCE/DEVICE Description STG Adhere to Safety Precautions With Assistance/Device. 06/20/2017 1017 - Progressing by Glean Salen, RN Flowsheets Taken 06/20/2017 1017  STG:Pt will adhere to safety precautions with assistance/device 4-Minimal assistance RH STG DECREASED RISK OF FALL WITH ASSISTANCE Description STG Decreased Risk of Fall With Assistance. 06/20/2017 1017 - Progressing by Glean Salen, RN Flowsheets Taken 06/20/2017 1017  [facility]:ENIDPOEUM risk of fall  with assistance/device 4-Minimal assistance RH STG DEMO UNDERSTANDING HOME SAFETY PRECAUTIONS 06/20/2017 1017 - Progressing by Glean Salen, RN RH OTHER STG SAFETY GOALS W/ASSIST Description Other STG Safety Goals With Assistance. 06/20/2017 1017 - Progressing by Glean Salen, RN RH COGNITION-NURSING RH STG USES MEMORY AIDS/STRATEGIES W/ASSIST TO PROBLEM SOLVE Description STG Uses Memory Aids/Strategies With Assistance to Problem Solve. 06/20/2017 1017 - Progressing by Glean Salen, RN Flowsheets Taken 06/20/2017 1017  STG: Uses memory aids/strategies with assistance 4-Minimal assistance RH STG ANTICIPATES NEEDS/CALLS FOR ASSIST W/ASSIST/CUES Description STG Anticipates Needs/Calls for Assist With Assistance/Cues. 06/20/2017 1017 - Progressing by Glean Salen, RN Flowsheets Taken 06/20/2017 1017  STG: Anticipates needs/calls for assistance with assistance/cues 4-Minimal assistance RH PAIN MANAGEMENT RH STG PAIN MANAGED AT OR BELOW PT'S PAIN GOAL 06/20/2017 1017 - Progressing by Glean Salen, RN RH OTHER STG PAIN MANAGEMENT GOALS W/ASSIST Description Other STG Pain Management Goals With Assistance. 06/20/2017 1017 - Progressing by Glean Salen, RN RH KNOWLEDGE DEFICIT RH STG INCREASE KNOWLEDGE OF DIABETES 06/20/2017 1017 - Progressing by Glean Salen, RN [facility]  STG INCREASE KNOWLEDGE OF HYPERTENSION 06/20/2017 1017 - Progressing by Glean Salen, RN RH STG INCREASE KNOWLEDGE OF DYSPHAGIA/FLUID INTAKE 06/20/2017 1017 - Progressing by Glean Salen, RN

## 2017-06-21 ENCOUNTER — Inpatient Hospital Stay (HOSPITAL_COMMUNITY): Payer: Medicare Other

## 2017-06-21 ENCOUNTER — Inpatient Hospital Stay (HOSPITAL_COMMUNITY): Payer: Medicare Other | Admitting: Occupational Therapy

## 2017-06-21 ENCOUNTER — Inpatient Hospital Stay (HOSPITAL_COMMUNITY): Payer: Medicare Other | Admitting: Speech Pathology

## 2017-06-21 LAB — GLUCOSE, CAPILLARY
GLUCOSE-CAPILLARY: 123 mg/dL — AB (ref 65–99)
GLUCOSE-CAPILLARY: 267 mg/dL — AB (ref 65–99)
Glucose-Capillary: 179 mg/dL — ABNORMAL HIGH (ref 65–99)
Glucose-Capillary: 100 mg/dL — ABNORMAL HIGH (ref 65–99)

## 2017-06-21 NOTE — Progress Notes (Signed)
Social Work Patient ID: [name], female   DOB: 1945/04/17, 72 y.o.   MRN: [id number]  Spoke with son via telephone to discuss team conference goals min assist level and target discharge date 4/5. He reports his Mom will be coming home to his home where his fiance is there and tow other young adults that can assist. Discussed having them come in for training prior to discharge and he feels this is not a problem. Will work on discharge needs and have encouraged them tomein over the weekend and see her in therapies this weekend to see her level. Will work on discharge needs, son does seem to understand pt will require 24 hr physical care at discharge.

## 2017-06-21 NOTE — Progress Notes (Signed)
Subjective/Complaints:  Severe left neglect , prolonged response latency ROS:  Denies CP,SOB, N/V/D Objective: Vital Signs: Blood pressure 128/88, pulse 60, temperature 98.9 F (37.2 C), temperature source Oral, resp. rate 16, height '5\' 7"'  (1.702 m), weight 78 kg (171 lb 15.3 oz), SpO2 99 %. No results found. Results for orders placed or performed during the hospital encounter of 06/19/17 (from the past 72 hour(s))  Glucose, capillary     Status: Abnormal   Collection Time: 06/19/17  9:46 PM  Result Value Ref Range   Glucose-Capillary 196 (H) 65 - 99 mg/dL  CBC WITH DIFFERENTIAL     Status: Abnormal   Collection Time: 06/20/17  6:09 AM  Result Value Ref Range   WBC 8.7 4.0 - 10.5 K/uL   RBC 4.77 3.87 - 5.11 MIL/uL   Hemoglobin 11.4 (L) 12.0 - 15.0 g/dL   HCT 38.0 36.0 - 46.0 %   MCV 79.7 78.0 - 100.0 fL   MCH 23.9 (L) 26.0 - 34.0 pg   MCHC 30.0 30.0 - 36.0 g/dL   RDW 17.8 (H) 11.5 - 15.5 %   Platelets 248 150 - 400 K/uL   Neutrophils Relative % 69 %   Neutro Abs 6.0 1.7 - 7.7 K/uL   Lymphocytes Relative 22 %   Lymphs Abs 1.9 0.7 - 4.0 K/uL   Monocytes Relative 7 %   Monocytes Absolute 0.7 0.1 - 1.0 K/uL   Eosinophils Relative 2 %   Eosinophils Absolute 0.2 0.0 - 0.7 K/uL   Basophils Relative 0 %   Basophils Absolute 0.0 0.0 - 0.1 K/uL    Comment: Performed at [facility], [address], [place], [place] [address]  Comprehensive metabolic panel     Status: Abnormal   Collection Time: 06/20/17  6:09 AM  Result Value Ref Range   Sodium 143 135 - 145 mmol/L   Potassium 3.5 3.5 - 5.1 mmol/L   Chloride 109 101 - 111 mmol/L   CO2 24 22 - 32 mmol/L   Glucose, Bld 152 (H) 65 - 99 mg/dL   BUN 16 6 - 20 mg/dL   Creatinine, Ser 0.80 0.44 - 1.00 mg/dL   Calcium 9.4 8.9 - 10.3 mg/dL   Total Protein 6.6 6.5 - 8.1 g/dL   Albumin 3.3 (L) 3.5 - 5.0 g/dL   AST 15 15 - 41 U/L   ALT 15 14 - 54 U/L   Alkaline Phosphatase 123 38 - 126 U/L   Total Bilirubin 0.5 0.3 - 1.2 mg/dL    GFR calc non Af Amer >60 >60 mL/min   GFR calc Af Amer >60 >60 mL/min    Comment: (NOTE) The eGFR has been calculated using the CKD EPI equation. This calculation has not been validated in all clinical situations. eGFR's persistently <60 mL/min signify possible Chronic Kidney Disease.    Anion gap 10 5 - 15    Comment: Performed at [facility] [address], [place], [place] [address]  Glucose, capillary     Status: Abnormal   Collection Time: 06/20/17  6:37 AM  Result Value Ref Range   Glucose-Capillary 140 (H) 65 - 99 mg/dL  Glucose, capillary     Status: Abnormal   Collection Time: 06/20/17 11:46 AM  Result Value Ref Range   Glucose-Capillary 163 (H) 65 - 99 mg/dL  Glucose, capillary     Status: Abnormal   Collection Time: 06/20/17  4:52 PM  Result Value Ref Range   Glucose-Capillary 179 (H)  65 - 99 mg/dL  Glucose, capillary     Status: Abnormal   Collection Time: 06/20/17  9:25 PM  Result Value Ref Range   Glucose-Capillary 123 (H) 65 - 99 mg/dL  Glucose, capillary     Status: Abnormal   Collection Time: 06/21/17  6:31 AM  Result Value Ref Range   Glucose-Capillary 100 (H) 65 - 99 mg/dL     HEENT: normal Cardio: RRR and no murmur Resp: CTA B/L and unlabored GI: BS positive and Non Tender and Non distended Extremity:  No Edema Skin:   Intact Neuro: Lethargic, Normal Sensory, Normal Motor, Inattention and Other left homonymous hemianopsia Musc/Skel:  Other Ulnar drift bilateral MCPs, Bouchard's deformity Right index finger Gen nad   Assessment/Plan: 1. Functional deficits secondary to Right PCA/post MCA infarct which require 3+ hours per day of interdisciplinary therapy in a comprehensive inpatient rehab setting. Physiatrist is providing close team supervision and 24 hour management of active medical problems listed below. Physiatrist and rehab team continue to assess barriers to discharge/monitor patient progress toward functional and medical  goals. FIM: Function - Bathing Position: Shower Body parts bathed by patient: Right arm, Right lower leg, Left lower leg, Left arm, Chest, Abdomen, Front perineal area, Right upper leg, Left upper leg Body parts bathed by helper: Buttocks, Back Assist Level: Touching or steadying assistance(Pt > 75%)  Function- Upper Body Dressing/Undressing What is the patient wearing?: Pull over shirt/dress Pull over shirt/dress - Perfomed by patient: Thread/unthread right sleeve, Put head through opening, Pull shirt over trunk Pull over shirt/dress - Perfomed by helper: Thread/unthread left sleeve Assist Level: Touching or steadying assistance(Pt > 75%) Function - Lower Body Dressing/Undressing What is the patient wearing?: Underwear, Pants, Non-skid slipper socks Position: Sitting EOB Underwear - Performed by patient: Thread/unthread right underwear leg, Thread/unthread left underwear leg, Pull underwear up/down Pants- Performed by patient: Pull pants up/down Pants- Performed by helper: Thread/unthread right pants leg, Thread/unthread left pants leg Non-skid slipper socks- Performed by patient: Don/doff right sock, Don/doff left sock Socks - Performed by patient: Don/doff right sock, Don/doff left sock Shoes - Performed by patient: Don/doff right shoe, Don/doff left shoe, Fasten right, Fasten left Assist for footwear: Supervision/touching assist Assist for lower body dressing: 2 Helpers  Function - Toileting Toileting steps completed by patient: Adjust clothing prior to toileting, Performs perineal hygiene, Adjust clothing after toileting Toileting steps completed by helper: Adjust clothing prior to toileting, Performs perineal hygiene, Adjust clothing after toileting Toileting Assistive Devices: Grab bar or rail Assist level: Touching or steadying assistance (Pt.75%)  Function - Air cabin crew transfer assistive device: Bedside commode Assist level to toilet: Moderate assist (Pt 50 -  74%/lift or lower) Assist level from toilet: Moderate assist (Pt 50 - 74%/lift or lower) Assist level to bedside commode (at bedside): Touching or steadying assistance (Pt > 75%) Assist level from bedside commode (at bedside): Touching or steadying assistance (Pt > 75%)  Function - Chair/bed transfer Chair/bed transfer activity did not occur: Safety/medical concerns Chair/bed transfer method: Stand pivot Chair/bed transfer assist level: Maximal assist (Pt 25 - 49%/lift and lower) Chair/bed transfer assistive device: Armrests, Bedrails  Function - Locomotion: Wheelchair Wheelchair activity did not occur: Safety/medical concerns Wheel 50 feet with 2 turns activity did not occur: Safety/medical concerns Wheel 150 feet activity did not occur: Safety/medical concerns Function - Locomotion: Ambulation Assistive device: (3 muskateers + w/c follow) Max distance: 12 ft Assist level: 2 helpers Assist level: 2 helpers Walk 50 feet with 2 turns activity  did not occur: Safety/medical concerns Walk 150 feet activity did not occur: Safety/medical concerns Walk 10 feet on uneven surfaces activity did not occur: Safety/medical concerns  Function - Comprehension Comprehension: Auditory Comprehension assist level: Understands basic 50 - 74% of the time/ requires cueing 25 - 49% of the time, Understands basic 25 - 49% of the time/ requires cueing 50 - 75% of the time  Function - Expression Expression: Verbal Expression assist level: Expresses basic 75 - 89% of the time/requires cueing 10 - 24% of the time. Needs helper to occlude trach/needs to repeat words.  Function - Social Interaction Social Interaction assist level: Interacts appropriately 75 - 89% of the time - Needs redirection for appropriate language or to initiate interaction.  Function - Problem Solving Problem solving assist level: Solves basic 25 - 49% of the time - needs direction more than half the time to initiate, plan or complete  simple activities  Function - Memory Memory assist level: Recognizes or recalls 25 - 49% of the time/requires cueing 50 - 75% of the time  Medical Problem List and Plan: 1.Functional and visual-spatial deficitssecondary to right PCA/MCA infarct -Team conference today please see physician documentation under team conference tab, met with team face-to-face to discuss problems,progress, and goals. Formulized individual treatment plan based on medical history, underlying problem and comorbidities.  2. DVT Prophylaxis/Anticoagulation: Will start Lovenox 40 mg daily.  3.Chronic LBP/Right knee pain/Pain Management:tylenol prn and local measures. Continue to hold Norco and gabapentin (used prn) 4. Mood:LCSW to follow for evaluation and support.Continue Wellbutrin bid 5. Neuropsych: This patientis not fullycapable of making decisions onherown behalf. 6. Skin/Wound Care:routine pressure relief measures 7. Fluids/Electrolytes/Nutrition:Monitor I/O. Check lytes in am.  8. A fib: Monitor HR bid. Continue Metoprolol bid with Cardizem daily. .  Vitals:   06/20/17 2039 06/21/17 0125  BP: 139/65 128/88  Pulse: 61 60  Resp:  16  Temp:  98.9 F (37.2 C)  SpO2:  99%   9. RA: managed with Arava and low dose prednisone.  10. GERD: On Protonix.  11.Hgb A1C- 6.9: T2DM: Monitor BS ac/hs. Resume Amaryl as po intake is good--continue to hold metformin for now.  CBG (last 3)  Recent Labs    06/20/17 1652 06/20/17 2125 06/21/17 0631  GLUCAP 179* 123* 100*   12. XBW:IOMBTDHR    LOS (Days) 2 A FACE TO FACE EVALUATION WAS PERFORMED  Charlett Blake 06/21/2017, 9:52 AM

## 2017-06-21 NOTE — Progress Notes (Signed)
Occupational Therapy Session Note  Patient Details  Name: [name] MRN: [id number] Date of Birth: [birth year]  Today's Date: 06/21/2017 OT Individual Time: 0730-0830 OT Individual Time Calculation (min): 60 min    Short Term Goals: Week 1:  OT Short Term Goal 1 (Week 1): Pt will complete toilet transfer with min A using LRAD OT Short Term Goal 2 (Week 1): Pt will correctly orient shirt mod I OT Short Term Goal 3 (Week 1): Pt will stand to complete 2 grooming tasks in order to increase functional activity tolerance OT Short Term Goal 4 (Week 1): Pt will locate grooming item on L sink ledge with min quesitoning cues  Skilled Therapeutic Interventions/Progress Updates:    Pt seen for OT session focusing on self-feeding, attention to L, attention to task and ADL re-training during dressing task. Pt asleep in supine upon arrival, slightly increased time to awake and become alert, however, once aroused agreeable to tx session. She transferred to EOB with mod A, multimodal cuing for sequencing/technique.  At start of session, pt saying she was "in a [place] place", however, when given option of 2, she correctly chose "hospital" and able to start she was on 4th floor of rehab unit.  She sat EOB to eat breakfast. Pt inconsistent with her ability to locate items on left, min-max cuing to locate items on L field, and max cuing to locate items in superior field. She demonstrated severely impaired perceptual deficits when attempting to cut up foods as well as when scooping food onto fork.  She dressed seated EOB, required max- total A for correct orientation of shirt and pants, demonstrating difficulty following VCs for correction of orientation. Pt required +2 to stand from EOB due to fear of falling, able to power into standing with mod A, however, fearful of obtaining erect posture due to fear of falling.  She completed max A stand pivot transfer to w/c with step-by-step sequencing for  technique. Grooming tasks completed from w/c level at sink, max cuing to locate item on L sink ledge. Pt left seated in w/c at end of session, QRB donned, chair alarm activated and all needs in reach. Throughout session, pt required cuing for redirection to task, pt easily distracted by internal and external distractions on R.   Therapy Documentation Precautions:  Precautions Precautions: Fall Precaution Comments: L inattention, L hemi, very fearful of falling Restrictions Weight Bearing Restrictions: No Pain:   no/denies pain  See Function Navigator for Current Functional Status.   Therapy/Group: Individual Therapy  Clois Treanor L 06/21/2017, 7:05 AM

## 2017-06-21 NOTE — Progress Notes (Signed)
Physical Therapy Note  Patient Details  Name: [name] MRN: [id number] Date of Birth: 09/16/1945 Today's Date: 06/21/2017  1443-1540, 75 min individual tx Pain: none per pt  Pt stated that her RA is factor in her ability to move, but does not have pain on a daily basis.  When asked why she was here, she stated "they say I had a stroke", indicating lack of insight.  Pt unable to state any deficits she has due to CVA. She did state that she has had memory problems in the past.  neuromuscular re-education via forced use, multimodal  Cues for alternating reciprocal movement x bil LEs in sitting in w/c, using Kinetron at level 50 cm/sec, x 25 cycles x 2.  Stand pivot transfer with RW to R, w/c> mat with min/mod assist, max cues for technique.  Seated task for L visual attention, general attention to task to match cards to sheet in front of her, using R hand to cross midline with great difficulty, requiring hand over hand assistance.  Pt demonstrated profound visual/spatial deficits, even when using R hand, and targets are at midline or to her R.  Pt fearful and reluctant to attempt stand/pivot transfer mat> R , so PT introduced slide board, and pt used with min assist, after r hand placed on r armrest of w/c.  Pt left resting in w/c with quick release belt applied, seat alarm set and all needs within reach.  See function navigator for current status.   Martez Weiand 06/21/2017, 1:27 PM

## 2017-06-21 NOTE — Progress Notes (Signed)
Social Work   Davide Risdon, Eliezer Champagne  Social Worker  Physical Medicine and Rehabilitation  Patient Care Conference  Signed  Date of Service:  06/21/2017 11:41 AM          Signed          [] Hide copied text  [] Hover for details   Inpatient RehabilitationTeam Conference and Plan of Care Update Date: 06/21/2017   Time: 10:40 AM      Patient Name: ICHELLE HARRAL      Medical Record Number: [id number]  Date of Birth: 08-18-1945 Sex: Female         Room/Bed: 4W19C/4W19C-01 Payor Info: Payor: MEDICARE / Plan: MEDICARE PART A AND B / Product Type: *No Product type* /     Admitting Diagnosis: CVA  Admit Date/Time:  06/19/2017  6:20 PM Admission Comments: No comment available    Primary Diagnosis:  <principal problem not specified> Principal Problem: <principal problem not specified>       Patient Active Problem List    Diagnosis Date Noted  . Stroke, acute, embolic ([place]) 53/61/4431  . Mitral regurgitation 06/17/2017  . Acute CVA (cerebrovascular accident) ([place])    . Thyroid nodule 06/15/2017  . Atrial fibrillation with RVR ([place]) 06/14/2017  . CVA (cerebral vascular accident) ([place]) 06/14/2017  . Type II diabetes mellitus ([place])    . Peripheral vascular disease ([place])    . Hyperlipidemia    . Hiatal hernia    . Edema    . Chronic diastolic (congestive) heart failure ([place])    . Carpal tunnel syndrome    . Atrial fibrillation ([place])    . Anemia    . Actinic keratosis    . Intractable nausea and vomiting 05/31/2016  . Nausea & vomiting 05/30/2016  . Abnormal chest x-ray 05/30/2016  . Diabetes mellitus with complication ([place])    . Influenza A 05/21/2016  . Chronic atrial fibrillation ([place]) 05/20/2016  . Acute CHF (congestive heart failure) ([place]) 05/20/2016  . History of GI bleed 03/29/2016  . Chronic anticoagulation 03/29/2016  . Symptomatic anemia 03/19/2016  . Spinal stenosis of lumbar region 02/10/2016  . Varicose veins of left leg with edema 09/15/2015  . Preop  cardiovascular exam 08/25/2015  . Iron deficiency anemia due to chronic blood loss 07/17/2015  . Anemia of chronic renal failure, stage 3 (moderate) ([place]) 07/17/2015  . Anticoagulant causing adverse effect in therapeutic use 07/17/2015  . Symptomatic bradycardia 02/21/2015  . Bradycardia    . Acute deep vein thrombosis (DVT) of left lower extremity ([place]) 05/06/2014  . Abdominal wall mass of right lower quadrant 07/08/2013  . OA (osteoarthritis) of hip 04/03/2013  . Osteoarthritis of hip 01/18/2013  . Night sweats 09/06/2012  . Chest pain    . Anxiety and depression    . Gastroesophageal reflux disease    . Hepatitis    . Jaundice    . Hypertension 07/01/2010  . Hypercholesterolemia 07/01/2010  . DM2 (diabetes mellitus, type 2) ([place]) 07/01/2010  . Rheumatoid arthritis ([place]) 07/01/2010  . Sinus tachycardia 07/01/2010  . Chest pain, atypical 07/01/2010      Expected Discharge Date: Expected Discharge Date: 07/07/17   Team Members Present: Physician leading conference: Dr. Alysia Penna Social Worker Present: Ovidio Kin, LCSW Nurse Present: Arelia Sneddon, RN PT Present: Barrie Folk, PT OT Present: Napoleon Form, OT SLP Present: Stormy Fabian, SLP PPS Coordinator present : Daiva Nakayama, RN, CRRN       Current Status/Progress Goal Weekly Team Focus  Medical  severe left neglect, severe cognitive deficts, poor blaance , Afib, mild anemia, hypoalb  maintain medical stability reduce fall and readmission risk  initiate theapy program   Bowel/Bladder     Continent of B/B  Maintain continence  Timed Toileting   Swallow/Nutrition/ Hydration               ADL's     Mod A stand pivot transfers; Mod A UB/LB bathing/dressing; min A grooming; max cuing to attend to L visual field; max cuing for sustained attention  Supervision overall  Functional transfers, neuro re-ed, sustained attention. ADL re-training   Mobility     max<>+2 total assist for transfers, ambulates 12 ft with 3  muskateers & w/c follow, very poor attention to task (<5 seconds), only consistently AxO to self, very fearful of falling  min assist overall  safety awareness, cognitive remediation, NMR, transfers, balance, gait, stair initiation   Communication               Safety/Cognition/ Behavioral Observations             Pain     Nonverbal evidence of pain (grimacing, restless, irritable)  Pain < 3  Assess qshift and prn   Skin     Red spots to buttocks  prevent breakdown and infection  Assess qshift and prn     *See Care Plan and progress notes for long and short-term goals.      Barriers to Discharge   Current Status/Progress Possible Resolutions Date Resolved   Physician     Medical stability;Behavior  severe left neglect  progressing toward goals  cont rehab      Nursing                 PT  Behavior  significant congnitive deficits, unsure of caregiver support upon d/c.              OT Decreased caregiver support  Family working to piece together 24 hr care             SLP            SW Decreased caregiver support [name] has health issues of his own             Discharge Planning/Teaching Needs:  Home with boyfriend who can provide supervision levle, son coordinating her care, aware will need someone with her at discharge.      Team Discussion:  Goals min-mod level due to poor attention, constant cueing, lethargic. Left visual field cut and other visual issues. Fear of falling limits her in therapies. Knee  gives out for no reason-MD aware. MD feels pt will make limited progress here and may need short term NHP  Revisions to Treatment Plan:  DC 4/5 versus NHP    Continued Need for Acute Rehabilitation Level of Care: The patient requires daily medical management by a physician with specialized training in physical medicine and rehabilitation for the following conditions: Daily direction of a multidisciplinary physical rehabilitation program to ensure safe treatment while  eliciting the highest outcome that is of practical value to the patient.: Yes Daily medical management of patient stability for increased activity during participation in an intensive rehabilitation regime.: Yes Daily analysis of laboratory values and/or radiology reports with any subsequent need for medication adjustment of medical intervention for : Neurological problems;Blood pressure problems   Mikeila Burgen, Gardiner Rhyme 06/21/2017, 11:41 AM  Patient ID: HAILYN ZARR, female   DOB: [birth year], 72 y.o.   MRN: [id number]

## 2017-06-21 NOTE — Plan of Care (Signed)
  Progressing RH BOWEL ELIMINATION RH STG MANAGE BOWEL WITH ASSISTANCE Description STG Manage Bowel with Assistance. 06/21/2017 0424 - Progressing by Evelena Asa, RN RH BLADDER ELIMINATION RH STG MANAGE BLADDER WITH ASSISTANCE Description STG Manage Bladder With Assistance 06/21/2017 0424 - Progressing by Evelena Asa, RN [facility] SKIN INTEGRITY RH STG SKIN FREE OF INFECTION/BREAKDOWN 06/21/2017 0424 - Progressing by Evelena Asa, RN   Not Progressing Consults [facility] STROKE PATIENT EDUCATION Description See Patient Education module for education specifics  06/21/2017 0424 - Not Progressing by Evelena Asa, RN RH SAFETY RH STG ADHERE TO SAFETY PRECAUTIONS W/ASSISTANCE/DEVICE Description STG Adhere to Safety Precautions With Assistance/Device. 06/21/2017 0424 - Not Progressing by Evelena Asa, RN

## 2017-06-21 NOTE — Progress Notes (Signed)
Speech Language Pathology Daily Session Note  Patient Details  Name: [name] MRN: [id number] Date of Birth: [birth year]  Today's Date: 06/21/2017 SLP Individual Time: 0900-1000 SLP Individual Time Calculation (min): 60 min  Short Term Goals: Week 1: SLP Short Term Goal 1 (Week 1): Pt will demonstrate sustained attention to functional tasks for 10 minutes with min A verbal cues for redirection, SLP Short Term Goal 2 (Week 1): Pt will demonstrate increase awareness by answering yes/no questions regrading deficits due to CVA with supervision A verbal/question cues.  SLP Short Term Goal 3 (Week 1): Pt will demonstrate functional problem solving for basic and familiar tasks with Max A verbal cues. SLP Short Term Goal 4 (Week 1): Pt will demonstrate auditory comprehension of 1-2 step commands during functional tasks in 50% of opportunties with Mod A verbal/question cues. SLP Short Term Goal 5 (Week 1): Pt will scan to midline in 50% of opportunties with Max A verbal/visual cues.  SLP Short Term Goal 6 (Week 1): Pt will utilize external memory aids to recall new,daily information with Max A verbal/question cues.   Skilled Therapeutic Interventions: Pt presents with severe cognitive impairments that are further complicated by significant visual field cuts (left and superior) and confusion/delusion that isn't attributed to cognitive deficits. As a result, pt requires Total A cues to recall orientation information, biographical information and Total A multimodal cues for sustained attention to task for ~ 2 minutes. SLP facilitated session by providing calendar with Total A hand over hand to locate information. Despite Total A multimodal cues, pt was not able to read at word level, is perseverative when attempting to read basic words, was able to name/identify colors but unable to place on matching colors. With Total A, pt is not able to name coins or their value and is not able to count simple pennies.  Pt also presents with significant delusion/confusion. For example, pt recalls husband's age as 73 years old. Despite Max A cues, pt had appropriate responses furthering her delusion. Pt was returned to room, left upright in wheelchair, safety belt donned and chair alarm set. All needs within reach. MD made aware of questionable delusional behaviors/mentation.      Function:  Eating Eating   Modified Consistency Diet: No Eating Assist Level: Set up assist for;Supervision or verbal cues   Eating Set Up Assist For: Opening containers;Cutting food       Cognition Comprehension Comprehension assist level: Understands basic 25 - 49% of the time/ requires cueing 50 - 75% of the time;Understands basic less than 25% of the time/ requires cueing >75% of the time  Expression   Expression assist level: Expresses basic 50 - 74% of the time/requires cueing 25 - 49% of the time. Needs to repeat parts of sentences.  Social Interaction Social Interaction assist level: Interacts appropriately 75 - 89% of the time - Needs redirection for appropriate language or to initiate interaction.  Problem Solving Problem solving assist level: Solves basic less than 25% of the time - needs direction nearly all the time or does not effectively solve problems and may need a restraint for safety  Memory Memory assist level: Recognizes or recalls less than 25% of the time/requires cueing greater than 75% of the time    Pain Pain Assessment Pain Score: 0-No pain  Therapy/Group: Individual Therapy  Lolly Glaus 06/21/2017, 12:45 PM

## 2017-06-21 NOTE — Patient Care Conference (Signed)
Inpatient RehabilitationTeam Conference and Plan of Care Update Date: 06/21/2017   Time: 10:40 AM    Patient Name: [name]      Medical Record Number: [id number]  Date of Birth: 1945-10-08 Sex: Female         Room/Bed: 4W19C/4W19C-01 Payor Info: Payor: MEDICARE / Plan: MEDICARE PART A AND B / Product Type: *No Product type* /    Admitting Diagnosis: CVA  Admit Date/Time:  06/19/2017  6:20 PM Admission Comments: No comment available   Primary Diagnosis:  <principal problem not specified> Principal Problem: <principal problem not specified>  Patient Active Problem List   Diagnosis Date Noted  . Stroke, acute, embolic ([place]) 59/56/3875  . Mitral regurgitation 06/17/2017  . Acute CVA (cerebrovascular accident) ([place])   . Thyroid nodule 06/15/2017  . Atrial fibrillation with RVR ([place]) 06/14/2017  . CVA (cerebral vascular accident) ([place]) 06/14/2017  . Type II diabetes mellitus ([place])   . Peripheral vascular disease ([place])   . Hyperlipidemia   . Hiatal hernia   . Edema   . Chronic diastolic (congestive) heart failure ([place])   . Carpal tunnel syndrome   . Atrial fibrillation ([place])   . Anemia   . Actinic keratosis   . Intractable nausea and vomiting 05/31/2016  . Nausea & vomiting 05/30/2016  . Abnormal chest x-ray 05/30/2016  . Diabetes mellitus with complication ([place])   . Influenza A 05/21/2016  . Chronic atrial fibrillation ([place]) 05/20/2016  . Acute CHF (congestive heart failure) ([place]) 05/20/2016  . History of GI bleed 03/29/2016  . Chronic anticoagulation 03/29/2016  . Symptomatic anemia 03/19/2016  . Spinal stenosis of lumbar region 02/10/2016  . Varicose veins of left leg with edema 09/15/2015  . Preop cardiovascular exam 08/25/2015  . Iron deficiency anemia due to chronic blood loss 07/17/2015  . Anemia of chronic renal failure, stage 3 (moderate) ([place]) 07/17/2015  . Anticoagulant causing adverse effect in therapeutic use 07/17/2015  . Symptomatic bradycardia  02/21/2015  . Bradycardia   . Acute deep vein thrombosis (DVT) of left lower extremity ([place]) 05/06/2014  . Abdominal wall mass of right lower quadrant 07/08/2013  . OA (osteoarthritis) of hip 04/03/2013  . Osteoarthritis of hip 01/18/2013  . Night sweats 09/06/2012  . Chest pain   . Anxiety and depression   . Gastroesophageal reflux disease   . Hepatitis   . Jaundice   . Hypertension 07/01/2010  . Hypercholesterolemia 07/01/2010  . DM2 (diabetes mellitus, type 2) ([place]) 07/01/2010  . Rheumatoid arthritis ([place]) 07/01/2010  . Sinus tachycardia 07/01/2010  . Chest pain, atypical 07/01/2010    Expected Discharge Date: Expected Discharge Date: 07/07/17  Team Members Present: Physician leading conference: Dr. Alysia Penna Social Worker Present: Ovidio Kin, LCSW Nurse Present: Arelia Sneddon, RN PT Present: Barrie Folk, PT OT Present: Napoleon Form, OT SLP Present: Stormy Fabian, SLP PPS Coordinator present : Daiva Nakayama, RN, CRRN     Current Status/Progress Goal Weekly Team Focus  Medical   severe left neglect, severe cognitive deficts, poor blaance , Afib, mild anemia, hypoalb  maintain medical stability reduce fall and readmission risk  initiate theapy program   Bowel/Bladder   Continent of B/B  Maintain continence  Timed Toileting   Swallow/Nutrition/ Hydration             ADL's   Mod A stand pivot transfers; Mod A UB/LB bathing/dressing; min A grooming; max cuing to attend to L visual field; max cuing for sustained attention  Supervision overall  Functional transfers,  neuro re-ed, sustained attention. ADL re-training   Mobility   max<>+2 total assist for transfers, ambulates 12 ft with 3 muskateers & w/c follow, very poor attention to task (<5 seconds), only consistently AxO to self, very fearful of falling  min assist overall  safety awareness, cognitive remediation, NMR, transfers, balance, gait, stair initiation   Communication             Safety/Cognition/  Behavioral Observations            Pain   Nonverbal evidence of pain (grimacing, restless, irritable)  Pain < 3  Assess qshift and prn   Skin   Red spots to buttocks  prevent breakdown and infection  Assess qshift and prn      *See Care Plan and progress notes for long and short-term goals.     Barriers to Discharge  Current Status/Progress Possible Resolutions Date Resolved   Physician    Medical stability;Behavior  severe left neglect  progressing toward goals  cont rehab      Nursing                  PT  Behavior  significant congnitive deficits, unsure of caregiver support upon d/c.              OT Decreased caregiver support  Family working to piece together 24 hr care             SLP                SW Decreased caregiver support [name] has health issues of his own            Discharge Planning/Teaching Needs:  Home with boyfriend who can provide supervision levle, son coordinating her care, aware will need someone with her at discharge.      Team Discussion:  Goals min-mod level due to poor attention, constant cueing, lethargic. Left visual field cut and other visual issues. Fear of falling limits her in therapies. Knee  gives out for no reason-MD aware. MD feels pt will make limited progress here and may need short term NHP  Revisions to Treatment Plan:  DC 4/5 versus NHP    Continued Need for Acute Rehabilitation Level of Care: The patient requires daily medical management by a physician with specialized training in physical medicine and rehabilitation for the following conditions: Daily direction of a multidisciplinary physical rehabilitation program to ensure safe treatment while eliciting the highest outcome that is of practical value to the patient.: Yes Daily medical management of patient stability for increased activity during participation in an intensive rehabilitation regime.: Yes Daily analysis of laboratory values and/or radiology reports with any  subsequent need for medication adjustment of medical intervention for : Neurological problems;Blood pressure problems  [name], [name] 06/21/2017, 11:41 AM

## 2017-06-22 ENCOUNTER — Inpatient Hospital Stay (HOSPITAL_COMMUNITY): Payer: Medicare Other | Admitting: Occupational Therapy

## 2017-06-22 ENCOUNTER — Inpatient Hospital Stay (HOSPITAL_COMMUNITY): Payer: Medicare Other | Admitting: Physical Therapy

## 2017-06-22 ENCOUNTER — Inpatient Hospital Stay (HOSPITAL_COMMUNITY): Payer: Medicare Other | Admitting: Speech Pathology

## 2017-06-22 LAB — GLUCOSE, CAPILLARY
GLUCOSE-CAPILLARY: 249 mg/dL — AB (ref 65–99)
GLUCOSE-CAPILLARY: 155 mg/dL — AB (ref 65–99)
GLUCOSE-CAPILLARY: 194 mg/dL — AB (ref 65–99)
GLUCOSE-CAPILLARY: 199 mg/dL — AB (ref 65–99)
Glucose-Capillary: 166 mg/dL — ABNORMAL HIGH (ref 65–99)
Glucose-Capillary: 178 mg/dL — ABNORMAL HIGH (ref 65–99)
Glucose-Capillary: 154 mg/dL — ABNORMAL HIGH (ref 65–99)

## 2017-06-22 MED ORDER — DICLOFENAC SODIUM 1 % TD GEL
2.0000 g | Freq: Four times a day (QID) | TRANSDERMAL | Status: DC
  Administered 2017-06-22 – 2017-07-05 (×49): 2 g via TOPICAL
  Filled 2017-06-22: qty 100

## 2017-06-22 MED ORDER — RIVAROXABAN 20 MG PO TABS
20.0000 mg | ORAL_TABLET | Freq: Every day | ORAL | Status: DC
  Administered 2017-06-22 – 2017-07-04 (×13): 20 mg via ORAL
  Filled 2017-06-22 (×13): qty 1

## 2017-06-22 NOTE — Progress Notes (Signed)
Speech Language Pathology Daily Session Note  Patient Details  Name: KONYA FAUBLE MRN: [id number] Date of Birth: January 06, 1946  Today's Date: 06/22/2017 SLP Individual Time: 1000-1030 SLP Individual Time Calculation (min): 30 min  Short Term Goals: Week 1: SLP Short Term Goal 1 (Week 1): Pt will demonstrate sustained attention to functional tasks for 10 minutes with min A verbal cues for redirection, SLP Short Term Goal 2 (Week 1): Pt will demonstrate increase awareness by answering yes/no questions regrading deficits due to CVA with supervision A verbal/question cues.  SLP Short Term Goal 3 (Week 1): Pt will demonstrate functional problem solving for basic and familiar tasks with Max A verbal cues. SLP Short Term Goal 4 (Week 1): Pt will demonstrate auditory comprehension of 1-2 step commands during functional tasks in 50% of opportunties with Mod A verbal/question cues. SLP Short Term Goal 5 (Week 1): Pt will scan to midline in 50% of opportunties with Max A verbal/visual cues.  SLP Short Term Goal 6 (Week 1): Pt will utilize external memory aids to recall new,daily information with Max A verbal/question cues.   Skilled Therapeutic Interventions: Skilled treatment session focused on cognition goals. SLP facilitated session by providing Mod A cues to sustain attention to task for ~ 30 minutes. Pt with decreased confusion today and was more accurate with biographical, orientation and situation questions. Pt required Mod A to solve simplistic card game (War). Pt was able to recall visual deficits related to CVA. Pt was handed off to PT. Continue per current plan of care.      Function:    Cognition Comprehension Comprehension assist level: Understands basic 25 - 49% of the time/ requires cueing 50 - 75% of the time  Expression   Expression assist level: Expresses basic 50 - 74% of the time/requires cueing 25 - 49% of the time. Needs to repeat parts of sentences.  Social Interaction Social  Interaction assist level: Interacts appropriately 50 - 74% of the time - May be physically or verbally inappropriate.  Problem Solving Problem solving assist level: Solves basic less than 25% of the time - needs direction nearly all the time or does not effectively solve problems and may need a restraint for safety;Solves basic 25 - 49% of the time - needs direction more than half the time to initiate, plan or complete simple activities  Memory Memory assist level: Recognizes or recalls less than 25% of the time/requires cueing greater than 75% of the time;Recognizes or recalls 25 - 49% of the time/requires cueing 50 - 75% of the time    Pain Pain Assessment Pain Scale: 0-10 Pain Score: 10-Worst pain ever Pain Type: Acute pain Pain Location: Knee Pain Orientation: Right Pain Descriptors / Indicators: Aching Pain Frequency: Intermittent Pain Onset: On-going Patients Stated Pain Goal: 2 Pain Intervention(s): Medication (See eMAR)  Therapy/Group: Individual Therapy  Dallis Darden 06/22/2017, 11:24 AM

## 2017-06-22 NOTE — Progress Notes (Signed)
Subjective/Complaints: Pt with c/o Right knee pain.  Has hx of RA and R knee pain as well as back pain with radic.  Sees Dr Nelva Bush for epidural injections as out pt  ROS:  Denies CP,SOB, N/V/D, poor awareness of deficits   Objective: Vital Signs: Blood pressure 113/61, pulse 95, temperature 97.9 F (36.6 C), temperature source Oral, resp. rate 18, height '5\' 7"'  (1.702 m), weight 78 kg (171 lb 15.3 oz), SpO2 97 %. No results found. Results for orders placed or performed during the hospital encounter of 06/19/17 (from the past 72 hour(s))  Glucose, capillary     Status: Abnormal   Collection Time: 06/19/17  9:46 PM  Result Value Ref Range   Glucose-Capillary 196 (H) 65 - 99 mg/dL  CBC WITH DIFFERENTIAL     Status: Abnormal   Collection Time: 06/20/17  6:09 AM  Result Value Ref Range   WBC 8.7 4.0 - 10.5 K/uL   RBC 4.77 3.87 - 5.11 MIL/uL   Hemoglobin 11.4 (L) 12.0 - 15.0 g/dL   HCT 38.0 36.0 - 46.0 %   MCV 79.7 78.0 - 100.0 fL   MCH 23.9 (L) 26.0 - 34.0 pg   MCHC 30.0 30.0 - 36.0 g/dL   RDW 17.8 (H) 11.5 - 15.5 %   Platelets 248 150 - 400 K/uL   Neutrophils Relative % 69 %   Neutro Abs 6.0 1.7 - 7.7 K/uL   Lymphocytes Relative 22 %   Lymphs Abs 1.9 0.7 - 4.0 K/uL   Monocytes Relative 7 %   Monocytes Absolute 0.7 0.1 - 1.0 K/uL   Eosinophils Relative 2 %   Eosinophils Absolute 0.2 0.0 - 0.7 K/uL   Basophils Relative 0 %   Basophils Absolute 0.0 0.0 - 0.1 K/uL    Comment: Performed at [facility], [address] [place], [place] [address]  Comprehensive metabolic panel     Status: Abnormal   Collection Time: 06/20/17  6:09 AM  Result Value Ref Range   Sodium 143 135 - 145 mmol/L   Potassium 3.5 3.5 - 5.1 mmol/L   Chloride 109 101 - 111 mmol/L   CO2 24 22 - 32 mmol/L   Glucose, Bld 152 (H) 65 - 99 mg/dL   BUN 16 6 - 20 mg/dL   Creatinine, Ser 0.80 0.44 - 1.00 mg/dL   Calcium 9.4 8.9 - 10.3 mg/dL   Total Protein 6.6 6.5 - 8.1 g/dL   Albumin 3.3 (L) 3.5 - 5.0 g/dL    AST 15 15 - 41 U/L   ALT 15 14 - 54 U/L   Alkaline Phosphatase 123 38 - 126 U/L   Total Bilirubin 0.5 0.3 - 1.2 mg/dL   GFR calc non Af Amer >60 >60 mL/min   GFR calc Af Amer >60 >60 mL/min    Comment: (NOTE) The eGFR has been calculated using the CKD EPI equation. This calculation has not been validated in all clinical situations. eGFR's persistently <60 mL/min signify possible Chronic Kidney Disease.    Anion gap 10 5 - 15    Comment: Performed at [facility] [address] [place], [place] [address]  Glucose, capillary     Status: Abnormal   Collection Time: 06/20/17  6:37 AM  Result Value Ref Range   Glucose-Capillary 140 (H) 65 - 99 mg/dL  Glucose, capillary     Status: Abnormal   Collection Time: 06/20/17 11:46 AM  Result Value Ref Range   Glucose-Capillary 163 (H)  65 - 99 mg/dL  Glucose, capillary     Status: Abnormal   Collection Time: 06/20/17  4:52 PM  Result Value Ref Range   Glucose-Capillary 179 (H) 65 - 99 mg/dL  Glucose, capillary     Status: Abnormal   Collection Time: 06/20/17  9:25 PM  Result Value Ref Range   Glucose-Capillary 123 (H) 65 - 99 mg/dL  Glucose, capillary     Status: Abnormal   Collection Time: 06/21/17  6:31 AM  Result Value Ref Range   Glucose-Capillary 100 (H) 65 - 99 mg/dL  Glucose, capillary     Status: Abnormal   Collection Time: 06/21/17 11:50 AM  Result Value Ref Range   Glucose-Capillary 267 (H) 65 - 99 mg/dL  Glucose, capillary     Status: Abnormal   Collection Time: 06/21/17  4:34 PM  Result Value Ref Range   Glucose-Capillary 249 (H) 65 - 99 mg/dL  Glucose, capillary     Status: Abnormal   Collection Time: 06/21/17  9:08 PM  Result Value Ref Range   Glucose-Capillary 155 (H) 65 - 99 mg/dL  Glucose, capillary     Status: Abnormal   Collection Time: 06/22/17  6:22 AM  Result Value Ref Range   Glucose-Capillary 166 (H) 65 - 99 mg/dL  Glucose, capillary     Status: Abnormal   Collection Time: 06/22/17 11:40 AM   Result Value Ref Range   Glucose-Capillary 178 (H) 65 - 99 mg/dL     HEENT: normal Cardio: RRR and no murmur Resp: CTA B/L and unlabored GI: BS positive and Non Tender and Non distended Extremity:  No Edema Skin:   Intact Neuro: Lethargic, Normal Sensory, Normal Motor, Inattention and Other left homonymous hemianopsia Musc/Skel:  Other Ulnar drift bilateral MCPs, Bouchard's deformity Right index finger, RIgh tknee effusion with tenderness Gen nad   Assessment/Plan: 1. Functional deficits secondary to Right PCA/post MCA infarct which require 3+ hours per day of interdisciplinary therapy in a comprehensive inpatient rehab setting. Physiatrist is providing close team supervision and 24 hour management of active medical problems listed below. Physiatrist and rehab team continue to assess barriers to discharge/monitor patient progress toward functional and medical goals. FIM: Function - Bathing Position: Shower Body parts bathed by patient: Right arm, Right lower leg, Left lower leg, Left arm, Chest, Abdomen, Front perineal area, Right upper leg, Left upper leg Body parts bathed by helper: Buttocks Assist Level: Touching or steadying assistance(Pt > 75%)  Function- Upper Body Dressing/Undressing What is the patient wearing?: Pull over shirt/dress Pull over shirt/dress - Perfomed by patient: Thread/unthread right sleeve, Put head through opening, Pull shirt over trunk, Thread/unthread left sleeve Pull over shirt/dress - Perfomed by helper: Thread/unthread left sleeve Assist Level: Touching or steadying assistance(Pt > 75%) Function - Lower Body Dressing/Undressing What is the patient wearing?: Pants, Non-skid slipper socks Position: Wheelchair/chair at sink Underwear - Performed by patient: Thread/unthread right underwear leg, Thread/unthread left underwear leg, Pull underwear up/down Pants- Performed by patient: Pull pants up/down, Thread/unthread right pants leg Pants- Performed by  helper: Thread/unthread left pants leg Non-skid slipper socks- Performed by patient: Don/doff right sock, Don/doff left sock Socks - Performed by patient: Don/doff right sock, Don/doff left sock Shoes - Performed by patient: Don/doff right shoe, Don/doff left shoe, Fasten right, Fasten left Assist for footwear: Supervision/touching assist Assist for lower body dressing: Touching or steadying assistance (Pt > 75%)  Function - Toileting Toileting steps completed by patient: Adjust clothing prior to toileting, Performs perineal hygiene, Adjust  clothing after toileting Toileting steps completed by helper: Adjust clothing prior to toileting, Performs perineal hygiene, Adjust clothing after toileting Toileting Assistive Devices: Grab bar or rail Assist level: Touching or steadying assistance (Pt.75%)  Function - Toilet Transfers Toilet transfer assistive device: Grab bar Assist level to toilet: Moderate assist (Pt 50 - 74%/lift or lower) Assist level from toilet: Moderate assist (Pt 50 - 74%/lift or lower) Assist level to bedside commode (at bedside): Touching or steadying assistance (Pt > 75%) Assist level from bedside commode (at bedside): Touching or steadying assistance (Pt > 75%)  Function - Chair/bed transfer Chair/bed transfer activity did not occur: Safety/medical concerns Chair/bed transfer method: Stand pivot Chair/bed transfer assist level: Moderate assist (Pt 50 - 74%/lift or lower) Chair/bed transfer assistive device: Walker Chair/bed transfer details: Manual facilitation for weight shifting, Verbal cues for technique, Verbal cues for safe use of DME/AE  Function - Locomotion: Wheelchair Wheelchair activity did not occur: Safety/medical concerns Assist Level: (P) Total assistance (Pt < 25%) Wheel 50 feet with 2 turns activity did not occur: Safety/medical concerns Wheel 150 feet activity did not occur: Safety/medical concerns Function - Locomotion: Ambulation Assistive device:  Hand held assist Max distance: 3 ft Assist level: 2 helpers Assist level: 2 helpers Walk 50 feet with 2 turns activity did not occur: Safety/medical concerns Walk 150 feet activity did not occur: Safety/medical concerns Walk 10 feet on uneven surfaces activity did not occur: Safety/medical concerns  Function - Comprehension Comprehension: Auditory Comprehension assist level: Understands basic 25 - 49% of the time/ requires cueing 50 - 75% of the time  Function - Expression Expression: Verbal Expression assist level: Expresses basic 50 - 74% of the time/requires cueing 25 - 49% of the time. Needs to repeat parts of sentences.  Function - Social Interaction Social Interaction assist level: Interacts appropriately 50 - 74% of the time - May be physically or verbally inappropriate.  Function - Problem Solving Problem solving assist level: Solves basic less than 25% of the time - needs direction nearly all the time or does not effectively solve problems and may need a restraint for safety, Solves basic 25 - 49% of the time - needs direction more than half the time to initiate, plan or complete simple activities  Function - Memory Memory assist level: Recognizes or recalls less than 25% of the time/requires cueing greater than 75% of the time, Recognizes or recalls 25 - 49% of the time/requires cueing 50 - 75% of the time Patient normally able to recall (first 3 days only): That he or she is in a hospital  Medical Problem List and Plan: 1.Functional and visual-spatial deficitssecondary to right PCA/MCA infarct -CIR PT, OT, SLP  2. DVT Prophylaxis/Anticoagulation: Contxarelto 12m /day 3.Chronic LBP/Right knee pain/Pain Management:tylenol prn and local measures. Continue to hold Norco and gabapentin (used prn) 4. Mood:LCSW to follow for evaluation and support.Continue Wellbutrin bid 5. Neuropsych: This patientis not fullycapable of making decisions onherown  behalf. 6. Skin/Wound Care:routine pressure relief measures 7. Fluids/Electrolytes/Nutrition:Monitor I/O. Check lytes in am.  8. A fib: Monitor HR bid. Continue Metoprolol bid with Cardizem daily. .  Vitals:   06/22/17 0900 06/22/17 1327  BP: (!) 141/80 113/61  Pulse: 94 95  Resp:  18  Temp:  97.9 F (36.6 C)  SpO2:  97%   9. RA: managed with Arava and low dose prednisone.  10. GERD: On Protonix.  11.Hgb A1C- 6.9: T2DM: Monitor BS ac/hs. Resume Amaryl as po intake is good--continue to hold metformin for  now.  CBG (last 3)  Recent Labs    06/21/17 2108 06/22/17 0622 06/22/17 1140  GLUCAP 155* 166* 178*  Adequate control 3/21 12.  Hypoalb- add prostat 13.  Right knee effusion noted since Xray 2/28, change diclofenac gel to QID, consider injection of corticosteroid, no oral NSAIDs due to CVA and Xarelto  LOS (Days) 3 A FACE TO FACE EVALUATION WAS PERFORMED  Charlett Blake 06/22/2017, 3:44 PM

## 2017-06-22 NOTE — Progress Notes (Signed)
Occupational Therapy Session Note  Patient Details  Name: [name] MRN: [id number] Date of Birth: 08/18/1945  Today's Date: 06/22/2017 OT Individual Time: 0354-6568 OT Individual Time Calculation (min): 75 min    Short Term Goals: Week 1:  OT Short Term Goal 1 (Week 1): Pt will complete toilet transfer with min A using LRAD OT Short Term Goal 2 (Week 1): Pt will correctly orient shirt mod I OT Short Term Goal 3 (Week 1): Pt will stand to complete 2 grooming tasks in order to increase functional activity tolerance OT Short Term Goal 4 (Week 1): Pt will locate grooming item on L sink ledge with min quesitoning cues  Skilled Therapeutic Interventions/Progress Updates:    Pt seen for OT ADL bathing/dressing session. Pt sitting up in recliner upon arrival with RN present assisting with set-up of breakfast and attempting to re-orient pt, hand off to OT to cont.  Pt able to recall she has a stroke, however, was very disoriented to place. She ate breakfast with assist for set-up, able to locate all needed items initially however, did not attend to food items L of midline. Plate rotated for pt to eat remainder of meal. Pt becoming distracted by external stimuli btwn each bite. Meal tray cleared of all items except plate and utensils to minimize distractions.  She completed stand pivot transfers with mod A throughout session with increased time and cuing for sequencing/technique.  She completed toileting tasks with mod steadying assist while pt able to perform hygiene following BM.  She bathed seated on tub bench, VCs for sequencing and attention to task to address all areas.  She returned to w/c to dress, assist required for clothing orientation of pants and shirt. Pt stood at RW to pull pants up, assist required to locate pants as pt pulling only at shirt.  Grooming tasks completed standing at sink with steadying assist, pt able to locate grooming item on L sink ledge with slightly increased  time.  Pt left seated in w/c at end of session, QRB donned and all needds in reach. Pt oriented to hospital and rehab unit independently at end of session.   Therapy Documentation Precautions:  Precautions Precautions: Fall Precaution Comments: L inattention, L hemi, very fearful of falling Restrictions Weight Bearing Restrictions: No Pain:   No/denies pain  See Function Navigator for Current Functional Status.   Therapy/Group: Individual Therapy  Trelon Plush L 06/22/2017, 6:59 AM

## 2017-06-22 NOTE — Plan of Care (Signed)
  Problem: RH BOWEL ELIMINATION Goal: RH STG MANAGE BOWEL WITH ASSISTANCE Description STG Manage Bowel with Assistance. Outcome: Progressing   Problem: RH BLADDER ELIMINATION Goal: RH STG MANAGE BLADDER WITH ASSISTANCE Description STG Manage Bladder With Assistance Outcome: Progressing   Problem: RH SKIN INTEGRITY Goal: RH STG SKIN FREE OF INFECTION/BREAKDOWN Outcome: Progressing   Problem: Consults Goal: RH STROKE PATIENT EDUCATION Description See Patient Education module for education specifics  Outcome: Not Progressing   Problem: RH SAFETY Goal: RH STG ADHERE TO SAFETY PRECAUTIONS W/ASSISTANCE/DEVICE Description STG Adhere to Safety Precautions With Assistance/Device. Outcome: Not Progressing   Problem: RH COGNITION-NURSING Goal: RH STG USES MEMORY AIDS/STRATEGIES W/ASSIST TO PROBLEM SOLVE Description STG Uses Memory Aids/Strategies With Assistance to Problem Solve. Outcome: Not Progressing   Problem: RH KNOWLEDGE DEFICIT Goal: RH STG INCREASE KNOWLEDGE OF DIABETES Outcome: Not Progressing

## 2017-06-22 NOTE — IPOC Note (Signed)
Overall Plan of Care [facility]) Patient Details Name: [name] MRN: [id number] DOB: 11/19/1945  Admitting Diagnosis: <principal problem not specified>stroke  Hospital Problems: Active Problems:   Stroke, acute, embolic [facility])     Functional Problem List: Nursing Behavior, Bladder, Bowel, Endurance, Medication Management, Motor, Edema, Nutrition, Perception, Safety, Sensory, Skin Integrity  PT Balance, Safety, Behavior, Endurance, Motor, Perception  OT Balance, Behavior, Perception, Cognition, Safety, Endurance, Motor, Vision  SLP    TR         Basic ADL's: OT Eating, Grooming, Bathing, Dressing, Toileting     Advanced  ADL's: OT       Transfers: PT Bed Mobility, Bed to Chair, Car, Manufacturing systems engineer, Metallurgist: PT Emergency planning/management officer, Ambulation, Stairs     Additional Impairments: OT None  SLP Communication, Social Cognition Chief Strategy Officer, Memory, Attention, Awareness, Social Interaction  TR      Anticipated Outcomes Item Anticipated Outcome  Self Feeding set-up  Swallowing      Basic self-care  Supervision  Toileting  Supervision   Bathroom Transfers Supervision  Bowel/Bladder  min assist   Transfers  min assist with LRAD  Locomotion  min assist with LRAD  Communication     Cognition  Min A  Pain  less<2  Safety/Judgment  min assist   Therapy Plan: PT Intensity: Minimum of 1-2 x/day ,45 to 90 minutes PT Frequency: 5 out of 7 days PT Duration Estimated Length of Stay: 3 weeks OT Intensity: Minimum of 1-2 x/day, 45 to 90 minutes OT Frequency: 5 out of 7 days OT Duration/Estimated Length of Stay: 2 weeks SLP Intensity: Minumum of 1-2 x/day, 30 to 90 minutes SLP Frequency: 3 to 5 out of 7 days SLP Duration/Estimated Length of Stay: 3-4 weeks    Team Interventions: Nursing Interventions Patient/Family Education, Bladder Management, Medication Management, Discharge Planning, Bowel Management, Skin Care/Wound  Management, Psychosocial Support, Disease Management/Prevention, Cognitive Remediation/Compensation  PT interventions Ambulation/gait training, DME/adaptive equipment instruction, Community reintegration, Neuromuscular re-education, Psychosocial support, Stair training, UE/LE Strength taining/ROM, Wheelchair propulsion/positioning, UE/LE Coordination activities, Therapeutic Activities, Pain management, Skin care/wound management, Discharge planning, Training and development officer, Cognitive remediation/compensation, Disease management/prevention, Functional mobility training, Patient/family education, Splinting/orthotics, Therapeutic Exercise, Visual/perceptual remediation/compensation  OT Interventions Balance/vestibular training, Discharge planning, Pain management, Self Care/advanced ADL retraining, Therapeutic Activities, UE/LE Coordination activities, Cognitive remediation/compensation, Disease mangement/prevention, Functional mobility training, Patient/family education, Therapeutic Exercise, Visual/perceptual remediation/compensation, Academic librarian, Engineer, drilling, Neuromuscular re-education, Psychosocial support, UE/LE Strength taining/ROM, Wheelchair propulsion/positioning  SLP Interventions Cognitive remediation/compensation, English as a second language teacher, Functional tasks, Internal/external aids, Patient/family education  TR Interventions    SW/CM Interventions Discharge Planning, Psychosocial Support, Patient/Family Education   Barriers to Discharge MD  Medical stability  Nursing      PT Behavior significant congnitive deficits, unsure of caregiver support upon d/c.  OT Decreased caregiver support Family working to piece together 24 hr care  SLP      SW Decreased caregiver support [name] has health issues of his own   Team Discharge Planning: Destination: PT-(Home VS SNF) ,OT- Home , SLP-Home Projected Follow-up: PT-24 hour supervision/assistance(HHPT vs SNF), OT-   Home health OT, 24 hour supervision/assistance, SLP-Home Health SLP, Outpatient SLP, 24 hour supervision/assistance Projected Equipment Needs: PT-To be determined, OT- To be determined, SLP-None recommended by SLP Equipment Details: PT-pt already has w/c with ELR from acute care, OT-  Patient/family involved in discharge planning: PT- Patient unable/family or caregiver not available,  OT-Patient, SLP-Patient  MD ELOS: 2-3 weeks Medical Rehab Prognosis:  Good Assessment: The patient has been admitted for CIR therapies with the diagnosis of Embolic Right PCA/MCA infarct. The team will be addressing functional mobility, strength, stamina, balance, safety, adaptive techniques and equipment, self-care, bowel and bladder mgt, patient and caregiver education, visual-perceptual and cognitive perceptual training, cognition, behavior, vestibular assessment/rx. Goals have been set at supervision for basi self-care, min assist for mobility and cognition.    Meredith Staggers, MD, FAAPMR      See Team Conference Notes for weekly updates to the plan of care

## 2017-06-22 NOTE — Progress Notes (Signed)
Physical Therapy Session Note  Patient Details  Name: [name] MRN: [id number] Date of Birth: 08-Jul-1945  Today's Date: 06/22/2017 PT Individual Time: [phone] and 1033-1130 PT Individual Time Calculation (min): 25 min and 57 min   Short Term Goals: Week 1:  PT Short Term Goal 1 (Week 1): Pt will complete bed<>w/c transfers with mod assist +1. PT Short Term Goal 2 (Week 1): Pt will ambulate 50 ft with max assist +1 & LRAD. PT Short Term Goal 3 (Week 1): Pt will initiate stair training.   Skilled Therapeutic Interventions/Progress Updates:  Treatment 1: Pt received in w/c & agreeable to tx. Pt denied c/o pain. Pt AxO x 4 on this date. Transported pt to [employer] where pt engaged in dynavision forwards facing & parallel to board with lights off in room for quiet, controlled environment. Pt requires max cuing and assist to attend to task and locate/press red lights. Pt with improved sustained attention to task with cuing on this date. Pt with poor perception and pressing around light requiring assist to hit it directly. Pt engaged in ball toss with task focusing on BUE coordination & attention to L. Pt able to toss ball to therapist standing on L & counted 10 throws, recalling need to terminate task after 10 reps. At end of session pt left sitting in w/c with QRB donned & all needs within reach.   Treatment 2: Pt received in handoff from SLP. Pt engaged in tabletop card game while standing with BUE/1UE support on table with steady assist<>close supervision for balance. Pt tolerated standing for 6 minutes before requiring a seated rest break. While engaging in game pt utilized L UE and moving cards to L with task focusing on L attention. Pt c/o R knee/leg pain & RN made aware & administered meds during session. In gym pt transferred to standing with HHA but returned to sitting 2/2 pain. Therapist wrapped R knee with ace wrap for support and stability. Pt then able to stand and take 3 steps with HHA  +2 assist. Pt with decreased ability to weight shift R 2/2 knee pain (pt reports this is an old issue that stems from sciatic back pain). Pt also fearful of ambulation. Pt engaged in peg board while standing with steady assist with task focusing on activity tolerance, weight bearing, L attention, attention to task and problem solving. Pt unable to recreate most simple pattern even with total cuing but unsure if this is due to impaired vision or cognition, suspect both. Pt also does not attend to preselected pegs placed on her L side. At end of session pt left sitting in w/c in room with QRB donned & needs within reach.  Therapy Documentation Precautions:  Precautions Precautions: Fall Precaution Comments: L inattention, L hemi, very fearful of falling Restrictions Weight Bearing Restrictions: No   See Function Navigator for Current Functional Status.   Therapy/Group: Individual Therapy  Waunita Schooner 06/22/2017, 12:19 PM

## 2017-06-23 ENCOUNTER — Inpatient Hospital Stay (HOSPITAL_COMMUNITY): Payer: Medicare Other | Admitting: Occupational Therapy

## 2017-06-23 ENCOUNTER — Inpatient Hospital Stay (HOSPITAL_COMMUNITY): Payer: Medicare Other | Admitting: Speech Pathology

## 2017-06-23 ENCOUNTER — Inpatient Hospital Stay (HOSPITAL_COMMUNITY): Payer: Medicare Other | Admitting: Physical Therapy

## 2017-06-23 LAB — GLUCOSE, CAPILLARY
GLUCOSE-CAPILLARY: 158 mg/dL — AB (ref 65–99)
GLUCOSE-CAPILLARY: 148 mg/dL — AB (ref 65–99)
GLUCOSE-CAPILLARY: 181 mg/dL — AB (ref 65–99)
Glucose-Capillary: 177 mg/dL — ABNORMAL HIGH (ref 65–99)
Glucose-Capillary: 179 mg/dL — ABNORMAL HIGH (ref 65–99)

## 2017-06-23 MED ORDER — ACETAMINOPHEN 325 MG PO TABS
650.0000 mg | ORAL_TABLET | Freq: Every day | ORAL | Status: DC
  Administered 2017-06-23 – 2017-07-04 (×12): 650 mg via ORAL
  Filled 2017-06-23 (×12): qty 2

## 2017-06-23 MED ORDER — GABAPENTIN 100 MG PO CAPS
100.0000 mg | ORAL_CAPSULE | Freq: Every day | ORAL | Status: DC
  Administered 2017-06-24 – 2017-07-04 (×11): 100 mg via ORAL
  Filled 2017-06-23 (×11): qty 1

## 2017-06-23 MED ORDER — GABAPENTIN 100 MG PO CAPS
100.0000 mg | ORAL_CAPSULE | Freq: Two times a day (BID) | ORAL | Status: DC
  Administered 2017-06-23: 100 mg via ORAL
  Filled 2017-06-23: qty 1

## 2017-06-23 NOTE — Progress Notes (Addendum)
Physical Therapy Session Note  Patient Details  Name: [name] MRN: [id number] Date of Birth: 1945/11/18  Today's Date: 06/23/2017 PT Individual Time: 9476-5465 and 0354-6568 PT Individual Time Calculation (min): 40 min and 27 min   Short Term Goals: Week 1:  PT Short Term Goal 1 (Week 1): Pt will complete bed<>w/c transfers with mod assist +1. PT Short Term Goal 2 (Week 1): Pt will ambulate 50 ft with max assist +1 & LRAD. PT Short Term Goal 3 (Week 1): Pt will initiate stair training.   Skilled Therapeutic Interventions/Progress Updates:  Treatment 1: Pt received in w/c & agreeable to tx. No c/o pain reported. Therapist wrapped R knee with ace wrap to provide more support. Pt AxO x 4 on this date. Discussed pt's fear of falling and safety measures taken during sessions to ensure pt does not fall to increase pt's comfort & build rapport with pt. Transported pt room<>gym via w/c total assist for time management. Pt completes w/c<>mat table transfer via squat pivot with mod assist with MAX multimodal cuing for anterior weight shifting, hand placement, sequencing, and pivoting. Pt engaged in sitting balance activity requiring her to reach outside of her BOS to obtain and move horseshoes. Pt utilized LUE for forced use and demonstrated ability to weight shift anteriorly, even picking item up off floor with steady assist at one point. Pt then transferred to standing and engaged in same activity with 1UE support and min assist. Pt then attempted to march in place but was unable to weight shift or bear weight through RLE to lift LLE. At end of session pt left sitting in w/c in room with QRB donned & needs within reach.  Treatment 2: Pt received in w/c & agreeable to tx. Pt reporting R knee pain & rest breaks provided PRN. Pt's R knee ace wrapped for support. Transported pt to gym via w/c total assist for time management. Pt completed sit>stand with assist in parallel bars. Pt requires multimodal  cuing for anterior weight shifting and assist to lift. Pt attempted marching in parallel bars and with improved ability to weight shift L<>R and clear feet from ground. Provided pt with tennis shoes & assisted her with donning them for time management. Pt them able to ambulate within bars with min assist with blocking/tactile cuing at R Knee to prevent buckling. Pt able to ambulate 12 ft before requiring seated rest break & pt unable to return to w/c. Initially during gait pt with good foot clearance BLE & improved step length but this fades as pt fatigues. At end of session pt left in handoff to OT.  Therapy Documentation Precautions:  Precautions Precautions: Fall Precaution Comments: L inattention, L hemi, very fearful of falling Restrictions Weight Bearing Restrictions: No   See Function Navigator for Current Functional Status.   Therapy/Group: Individual Therapy  Waunita Schooner 06/23/2017, 2:16 PM

## 2017-06-23 NOTE — Progress Notes (Signed)
Speech Language Pathology Daily Session Note  Patient Details  Name: [name] MRN: [id number] Date of Birth: 1945-11-15  Today's Date: 06/23/2017 SLP Individual Time: 1045-1130 SLP Individual Time Calculation (min): 45 min  Short Term Goals: Week 1: SLP Short Term Goal 1 (Week 1): Pt will demonstrate sustained attention to functional tasks for 10 minutes with min A verbal cues for redirection, SLP Short Term Goal 2 (Week 1): Pt will demonstrate increase awareness by answering yes/no questions regrading deficits due to CVA with supervision A verbal/question cues.  SLP Short Term Goal 3 (Week 1): Pt will demonstrate functional problem solving for basic and familiar tasks with Max A verbal cues. SLP Short Term Goal 4 (Week 1): Pt will demonstrate auditory comprehension of 1-2 step commands during functional tasks in 50% of opportunties with Mod A verbal/question cues. SLP Short Term Goal 5 (Week 1): Pt will scan to midline in 50% of opportunties with Max A verbal/visual cues.  SLP Short Term Goal 6 (Week 1): Pt will utilize external memory aids to recall new,daily information with Max A verbal/question cues.   Skilled Therapeutic Interventions: Skilled treatment session focused on cognition goals. SLP received pt upright in wheelchair, awake and writing down on paper a list of things for her husband to bring- demonstrating good problem solving. SLP facilitated session by providing supervision cues to name coins when placed on white paper within her visual field. However after 15 minutes pt began instantaneously started falling asleep which impacted her ability to perform any further tasks. Even with cues, pt able to open eyes but instantaneously closed them. She didn't appear fatigued or lethargic. PA was walking by and evaluated pt and will review chart review medicines from previous night. Pt was returned to room, left upright in wheelchair with safety belt in place and all needs within reach.       Function:    Cognition Comprehension Comprehension assist level: Understands basic 50 - 74% of the time/ requires cueing 25 - 49% of the time  Expression   Expression assist level: Expresses basic 50 - 74% of the time/requires cueing 25 - 49% of the time. Needs to repeat parts of sentences.  Social Interaction Social Interaction assist level: Interacts appropriately 50 - 74% of the time - May be physically or verbally inappropriate.  Problem Solving Problem solving assist level: Solves basic 25 - 49% of the time - needs direction more than half the time to initiate, plan or complete simple activities  Memory Memory assist level: Recognizes or recalls less than 25% of the time/requires cueing greater than 75% of the time;Recognizes or recalls 25 - 49% of the time/requires cueing 50 - 75% of the time    Pain    Therapy/Group: Individual Therapy  [name] 06/23/2017, 3:32 PM

## 2017-06-23 NOTE — Progress Notes (Signed)
Subjective/Complaints: Pt does not remember talking to me yesterday pm.  Per OT had sciatic pain during standing activity  ROS:  Denies CP,SOB, N/V/D, poor awareness of deficits   Objective: Vital Signs: Blood pressure 135/65, pulse 65, temperature 97.7 F (36.5 C), temperature source Oral, resp. rate 16, height 5\' 7"  (1.702 m), weight 78 kg (171 lb 15.3 oz), SpO2 98 %. No results found. Results for orders placed or performed during the hospital encounter of 06/19/17 (from the past 72 hour(s))  Glucose, capillary     Status: Abnormal   Collection Time: 06/20/17 11:46 AM  Result Value Ref Range   Glucose-Capillary 163 (H) 65 - 99 mg/dL  Glucose, capillary     Status: Abnormal   Collection Time: 06/20/17  4:52 PM  Result Value Ref Range   Glucose-Capillary 179 (H) 65 - 99 mg/dL  Glucose, capillary     Status: Abnormal   Collection Time: 06/20/17  9:25 PM  Result Value Ref Range   Glucose-Capillary 123 (H) 65 - 99 mg/dL  Glucose, capillary     Status: Abnormal   Collection Time: 06/21/17  6:31 AM  Result Value Ref Range   Glucose-Capillary 100 (H) 65 - 99 mg/dL  Glucose, capillary     Status: Abnormal   Collection Time: 06/21/17 11:50 AM  Result Value Ref Range   Glucose-Capillary 267 (H) 65 - 99 mg/dL  Glucose, capillary     Status: Abnormal   Collection Time: 06/21/17  4:34 PM  Result Value Ref Range   Glucose-Capillary 249 (H) 65 - 99 mg/dL  Glucose, capillary     Status: Abnormal   Collection Time: 06/21/17  9:08 PM  Result Value Ref Range   Glucose-Capillary 155 (H) 65 - 99 mg/dL  Glucose, capillary     Status: Abnormal   Collection Time: 06/22/17  6:22 AM  Result Value Ref Range   Glucose-Capillary 166 (H) 65 - 99 mg/dL  Glucose, capillary     Status: Abnormal   Collection Time: 06/22/17 11:40 AM  Result Value Ref Range   Glucose-Capillary 178 (H) 65 - 99 mg/dL  Glucose, capillary     Status: Abnormal   Collection Time: 06/22/17  4:54 PM  Result Value Ref Range   Glucose-Capillary 194 (H) 65 - 99 mg/dL  Glucose, capillary     Status: Abnormal   Collection Time: 06/22/17  5:10 PM  Result Value Ref Range   Glucose-Capillary 199 (H) 65 - 99 mg/dL  Glucose, capillary     Status: Abnormal   Collection Time: 06/22/17  8:52 PM  Result Value Ref Range   Glucose-Capillary 154 (H) 65 - 99 mg/dL  Glucose, capillary     Status: Abnormal   Collection Time: 06/23/17  5:24 AM  Result Value Ref Range   Glucose-Capillary 158 (H) 65 - 99 mg/dL   Comment 1 Notify RN   Glucose, capillary     Status: Abnormal   Collection Time: 06/23/17  6:45 AM  Result Value Ref Range   Glucose-Capillary 148 (H) 65 - 99 mg/dL   Comment 1 Notify RN      HEENT: normal Cardio: RRR and no murmur Resp: CTA B/L and unlabored GI: BS positive and Non Tender and Non distended Extremity:  No Edema Skin:   Intact Neuro: Lethargic, Normal Sensory, Normal Motor, Inattention and Other left homonymous hemianopsia Musc/Skel:  Other Ulnar drift bilateral MCPs, Bouchard's deformity Right index finger, RIgh tknee effusion with tenderness Gen nad   Assessment/Plan: 1. Functional deficits  secondary to Right PCA/post MCA infarct which require 3+ hours per day of interdisciplinary therapy in a comprehensive inpatient rehab setting. Physiatrist is providing close team supervision and 24 hour management of active medical problems listed below. Physiatrist and rehab team continue to assess barriers to discharge/monitor patient progress toward functional and medical goals. FIM: Function - Bathing Position: Shower Body parts bathed by patient: Right arm, Right lower leg, Left lower leg, Left arm, Chest, Abdomen, Front perineal area, Right upper leg, Left upper leg Body parts bathed by helper: Buttocks Assist Level: Touching or steadying assistance(Pt > 75%)  Function- Upper Body Dressing/Undressing What is the patient wearing?: Pull over shirt/dress Pull over shirt/dress - Perfomed by patient:  Thread/unthread right sleeve, Put head through opening, Pull shirt over trunk, Thread/unthread left sleeve Pull over shirt/dress - Perfomed by helper: Thread/unthread left sleeve Assist Level: Touching or steadying assistance(Pt > 75%) Function - Lower Body Dressing/Undressing What is the patient wearing?: Pants, Non-skid slipper socks Position: Wheelchair/chair at sink Underwear - Performed by patient: Thread/unthread right underwear leg, Thread/unthread left underwear leg, Pull underwear up/down Pants- Performed by patient: Pull pants up/down, Thread/unthread right pants leg Pants- Performed by helper: Thread/unthread left pants leg Non-skid slipper socks- Performed by patient: Don/doff right sock, Don/doff left sock Socks - Performed by patient: Don/doff right sock, Don/doff left sock Shoes - Performed by patient: Don/doff right shoe, Don/doff left shoe, Fasten right, Fasten left Assist for footwear: Supervision/touching assist Assist for lower body dressing: Touching or steadying assistance (Pt > 75%)  Function - Toileting Toileting steps completed by patient: Adjust clothing prior to toileting, Performs perineal hygiene, Adjust clothing after toileting Toileting steps completed by helper: Adjust clothing prior to toileting, Performs perineal hygiene, Adjust clothing after toileting Toileting Assistive Devices: Grab bar or rail Assist level: Touching or steadying assistance (Pt.75%)  Function - Air cabin crew transfer assistive device: Grab bar Assist level to toilet: Moderate assist (Pt 50 - 74%/lift or lower) Assist level from toilet: Moderate assist (Pt 50 - 74%/lift or lower) Assist level to bedside commode (at bedside): Touching or steadying assistance (Pt > 75%) Assist level from bedside commode (at bedside): Touching or steadying assistance (Pt > 75%)  Function - Chair/bed transfer Chair/bed transfer activity did not occur: Safety/medical concerns Chair/bed transfer  method: Stand pivot Chair/bed transfer assist level: Moderate assist (Pt 50 - 74%/lift or lower) Chair/bed transfer assistive device: Walker Chair/bed transfer details: Manual facilitation for weight shifting, Verbal cues for technique, Verbal cues for safe use of DME/AE  Function - Locomotion: Wheelchair Wheelchair activity did not occur: Safety/medical concerns Assist Level: (P) Total assistance (Pt < 25%) Wheel 50 feet with 2 turns activity did not occur: Safety/medical concerns Wheel 150 feet activity did not occur: Safety/medical concerns Function - Locomotion: Ambulation Assistive device: Hand held assist Max distance: 3 ft Assist level: 2 helpers Assist level: 2 helpers Walk 50 feet with 2 turns activity did not occur: Safety/medical concerns Walk 150 feet activity did not occur: Safety/medical concerns Walk 10 feet on uneven surfaces activity did not occur: Safety/medical concerns  Function - Comprehension Comprehension: Auditory Comprehension assist level: Understands basic 25 - 49% of the time/ requires cueing 50 - 75% of the time  Function - Expression Expression: Verbal Expression assist level: Expresses basic 50 - 74% of the time/requires cueing 25 - 49% of the time. Needs to repeat parts of sentences.  Function - Social Interaction Social Interaction assist level: Interacts appropriately 50 - 74% of the time - May  be physically or verbally inappropriate.  Function - Problem Solving Problem solving assist level: Solves basic less than 25% of the time - needs direction nearly all the time or does not effectively solve problems and may need a restraint for safety, Solves basic 25 - 49% of the time - needs direction more than half the time to initiate, plan or complete simple activities  Function - Memory Memory assist level: Recognizes or recalls less than 25% of the time/requires cueing greater than 75% of the time, Recognizes or recalls 25 - 49% of the time/requires  cueing 50 - 75% of the time Patient normally able to recall (first 3 days only): That he or she is in a hospital  Medical Problem List and Plan: 1.Functional and visual-spatial deficitssecondary to right PCA/MCA infarct -CIR PT, OT, SLP  2. DVT Prophylaxis/Anticoagulation: Contxarelto 20mg  /day 3.Chronic LBP/Right knee pain/Pain Management:tylenol prn and local measures. Continue to hold Norco and restart gabapentin for Sciatic pain 4. Mood:LCSW to follow for evaluation and support.Continue Wellbutrin bid 5. Neuropsych: This patientis not fullycapable of making decisions onherown behalf. 6. Skin/Wound Care:routine pressure relief measures 7. Fluids/Electrolytes/Nutrition:Monitor I/O. Check lytes in am.  8. A fib: Monitor HR bid. Continue Metoprolol bid with Cardizem daily. .  Vitals:   06/23/17 0400 06/23/17 0530  BP: 113/79 135/65  Pulse: 72 65  Resp: 16   Temp: 98.5 F (36.9 C) 97.7 F (36.5 C)  SpO2: 98%    9. RA: managed with Arava and low dose prednisone.  10. GERD: On Protonix.  11.Hgb A1C- 6.9: T2DM: Monitor BS ac/hs. Resume Amaryl as po intake is good--continue to hold metformin for now.  CBG (last 3)  Recent Labs    06/22/17 2052 06/23/17 0524 06/23/17 0645  GLUCAP 154* 158* 148*  Adequate control 3/21 12.  Hypoalb- add prostat 13.  Right knee effusion noted since Xray 2/28, change diclofenac gel to QID, consider injection of corticosteroid, no oral NSAIDs due to CVA and Xarelto  LOS (Days) 4 A FACE TO FACE EVALUATION WAS PERFORMED  Charlett Blake 06/23/2017, 7:55 AM

## 2017-06-23 NOTE — Progress Notes (Signed)
Occupational Therapy Session Note  Patient Details  Name: [name] MRN: [id number] Date of Birth: 30-Jan-1946  Today's Date: 06/23/2017 OT Individual Time: 0730-0800 and 1405-1500 OT Individual Time Calculation (min): 30 min and 55 min   Short Term Goals: Week 1:  OT Short Term Goal 1 (Week 1): Pt will complete toilet transfer with min A using LRAD OT Short Term Goal 2 (Week 1): Pt will correctly orient shirt mod I OT Short Term Goal 3 (Week 1): Pt will stand to complete 2 grooming tasks in order to increase functional activity tolerance OT Short Term Goal 4 (Week 1): Pt will locate grooming item on L sink ledge with min quesitoning cues  Skilled Therapeutic Interventions/Progress Updates:    Session One: Pt seen for OT session focusing on re-orientation, attention to L, and attention to task during self-feeding task.  Pt in supine upon arrival, attempting to get out of bed, and voicing confusion regarding location.  Pt stated she was in "[place]", however, once oriented to "hospital" pt responds "oh yea, I was told I had a stroke". Pt oriented to year as well. She transferred to EOB with min A following VCs for sequencing. Assist provided for set-up of meal. Pt able to locate all needed food and utensil items independently, however, during self-feeding did not attend to food on R side of plate, demonstraing very inconsistent visual and scanning abilities. While seated EOB and eating breakfast, discussed at length d/c planning. Pt feels she is capable of returning home independently today demonstrating very poor insight into deficits. Reoriented to time, place, situation, and discussed d/c planning. Pt wishing CSW will discuss this with significant other, therapist will make CSW aware. She completed mod A stand pivot transfer to w/c with max cuing for sequencing and HHA. Pt left seated in w/c at end of session, QRB donned and all needs in reach.   Session Two: Pt received in therapy gym  with hand off from PT, pt agreeable to tx session. She was oriented x4 throughout session, however, did not recall working with this therapist earlier in the day.  From w/c level, completed sorting task at table top level, required to sort bean bags by color and place in respective pile. Pt able to correctly identify and sort 100% of colors/ beanbags, however, required max cuing throughout for attention to task, requiring cues btwn each bean bag to cont with task. She also required mod cuing to locate sorting pile placed L of midline and in L lower visual quadrant.  Progressed activity to pt matching beanbags with targets on floor, still sorting by color. Pt required total A to locate 3 targets initally, however, once started, pt able to attend to task until completion with 100% accuracy. Pt returned to room at end of session, left seated with QRB donned and all needs in reach.   Therapy Documentation Precautions:  Precautions Precautions: Fall Precaution Comments: L inattention, L hemi, very fearful of falling Restrictions Weight Bearing Restrictions: No Pain:    No/denies pain  See Function Navigator for Current Functional Status.   Therapy/Group: Individual Therapy  Ziair Penson L 06/23/2017, 7:08 AM

## 2017-06-23 NOTE — Progress Notes (Signed)
Patient with sedative effects with gabapentin. Will change to bedtime and monitor. Will also d/c trazodone and schedule tylenol at bedtime.

## 2017-06-24 ENCOUNTER — Inpatient Hospital Stay (HOSPITAL_COMMUNITY): Payer: Medicare Other

## 2017-06-24 ENCOUNTER — Inpatient Hospital Stay (HOSPITAL_COMMUNITY): Payer: Medicare Other | Admitting: Physical Therapy

## 2017-06-24 DIAGNOSIS — F329 Major depressive disorder, single episode, unspecified: Secondary | ICD-10-CM

## 2017-06-24 DIAGNOSIS — M069 Rheumatoid arthritis, unspecified: Secondary | ICD-10-CM

## 2017-06-24 LAB — GLUCOSE, CAPILLARY
GLUCOSE-CAPILLARY: 222 mg/dL — AB (ref 65–99)
Glucose-Capillary: 138 mg/dL — ABNORMAL HIGH (ref 65–99)
Glucose-Capillary: 157 mg/dL — ABNORMAL HIGH (ref 65–99)
Glucose-Capillary: 180 mg/dL — ABNORMAL HIGH (ref 65–99)

## 2017-06-24 NOTE — Progress Notes (Addendum)
Awake and alert for med 1400 pass. Denies pain or headache, does not remember taking nitro in AM. States "the truck going by is my boyfriend [name] and then Sonia Baller ran by here and told me not to get up." Reminded pt she was on the 4th floor and there was only a parking lot outside her window, stated, "Oh, I must be hearing things." Requires frequent reorientation to telephone and call bell and reminders not call for help.   clarification: "reminders not to yell for help" instead of "reminders not to call for help" as pt is frequently heard yelling for [name] and [name] and looking for them from room.

## 2017-06-24 NOTE — Progress Notes (Signed)
Occupational Therapy Session Note  Patient Details  Name: [name] MRN: [id number] Date of Birth: Apr 18, 1945  Today's Date: 06/24/2017 OT Individual Time: 0900-1011 OT Individual Time Calculation (min): 71 min    Short Term Goals: Week 1:  OT Short Term Goal 1 (Week 1): Pt will complete toilet transfer with min A using LRAD OT Short Term Goal 2 (Week 1): Pt will correctly orient shirt mod I OT Short Term Goal 3 (Week 1): Pt will stand to complete 2 grooming tasks in order to increase functional activity tolerance OT Short Term Goal 4 (Week 1): Pt will locate grooming item on L sink ledge with min quesitoning cues  Skilled Therapeutic Interventions/Progress Updates:    1:1. Pt with no c/o pain and AxO x4. Pt with varying arousal after first 30 min of session with tactile cues to alert pt. Pt stands at sink with lifting A and VC for hand placement throuhgout transition. Pt able to locate all oral care items on sink. Pt stands at counter in ADL apartment crossing midline to retrieve/return cups and glasses to cabinets with lifting A for transitional movement and multimodal cueing to locate cups on far L visual field. Pt sorts silverware placed on L with Vc for visual scanning and tactile cues for arousal/attention sorting 75% of silverware correctly without cues. Pt completes dynavision activity 3x20 min with MAX multimodal cueing to locate stimulus on L and HOH A to correctly hit correct button on L as compared to 3-4 seconds on R to push button wihtout HOH A. Exited session with pt seated in chair, QRB donned and all needs met  Therapy Documentation Precautions:  Precautions Precautions: Fall Precaution Comments: L inattention, L hemi, very fearful of falling Restrictions Weight Bearing Restrictions: No  See Function Navigator for Current Functional Status.   Therapy/Group: Individual Therapy  Tonny Branch 06/24/2017, 10:13 AM

## 2017-06-24 NOTE — Progress Notes (Signed)
Speech Language Pathology Daily Session Note  Patient Details  Name: [name] MRN: [id number] Date of Birth: 06-02-1945  Today's Date: 06/24/2017 SLP Individual Time: 1345-1425 SLP Individual Time Calculation (min): 40 min  Short Term Goals: Week 1: SLP Short Term Goal 1 (Week 1): Pt will demonstrate sustained attention to functional tasks for 10 minutes with min A verbal cues for redirection, SLP Short Term Goal 2 (Week 1): Pt will demonstrate increase awareness by answering yes/no questions regrading deficits due to CVA with supervision A verbal/question cues.  SLP Short Term Goal 3 (Week 1): Pt will demonstrate functional problem solving for basic and familiar tasks with Max A verbal cues. SLP Short Term Goal 4 (Week 1): Pt will demonstrate auditory comprehension of 1-2 step commands during functional tasks in 50% of opportunties with Mod A verbal/question cues. SLP Short Term Goal 5 (Week 1): Pt will scan to midline in 50% of opportunties with Max A verbal/visual cues.  SLP Short Term Goal 6 (Week 1): Pt will utilize external memory aids to recall new,daily information with Max A verbal/question cues.   Skilled Therapeutic Interventions:Skilled ST services focused on cognitive skills. Pt began talking about boyfriend Grayland Ormond being in the room, upon entering room, stating "Grayland Ormond is over there." SLP informed pt that there was no one present in the room besides the two of them and to the area of the pt believed "kenny" to be in. Pt stated agreement, however referenced "kenny" being in the room and or in the parking lot just outside the window (which is not the parking lot) several times during the session. SLP facilitated basic problem solving attempting to have pt identify a problem given large multi contrasting picture cards, however was unable to do do given max A verbal/visual cues. SL facilitated basic problem solving counting change on top of white paper, pt began counting with mod A  verbal cues and required max-mod A verbal/visual cues to scan to left side. Pt immediately fell asleep 10 minutes into session, and inconsistent in her awareness of sleeping episode throughout session, Pt required max A verbal cues to sustain attention approximately every 2-3 minutes for the rest of the session. SLP facilitated basic problem solving with familiar card game, War, pt required mod A verbal cues for problem solving and max A for sustained attention.  SLP facilitated . Pt was left in room with call bell within reach and safety belt on. Reccomend to continue skilled ST services.       Function:  Eating Eating                 Cognition Comprehension Comprehension assist level: Understands basic 50 - 74% of the time/ requires cueing 25 - 49% of the time  Expression   Expression assist level: Expresses basic 50 - 74% of the time/requires cueing 25 - 49% of the time. Needs to repeat parts of sentences.  Social Interaction Social Interaction assist level: Interacts appropriately 50 - 74% of the time - May be physically or verbally inappropriate.  Problem Solving Problem solving assist level: Solves basic 25 - 49% of the time - needs direction more than half the time to initiate, plan or complete simple activities  Memory Memory assist level: Recognizes or recalls less than 25% of the time/requires cueing greater than 75% of the time;Recognizes or recalls 25 - 49% of the time/requires cueing 50 - 75% of the time    Pain Pain Assessment Pain Score: 0-No pain  Therapy/Group: Individual  Therapy  Saranne Crislip  [facility] 06/24/2017, 2:28 PM

## 2017-06-24 NOTE — Significant Event (Signed)
Pt c/o of chest pain; admin nitroglycerin 1 sublingual tablet at 0723; pt denies chest pain after admin; oncoming RN made aware

## 2017-06-24 NOTE — Progress Notes (Signed)
Physical Therapy Session Note  Patient Details  Name: [name] MRN: [id number] Date of Birth: 1946/01/14  Today's Date: 06/24/2017 PT Individual Time: 9983-3825 1520-1600 PT Individual Time Calculation (min): 30 min 40 min    Short Term Goals: Week 1:  PT Short Term Goal 1 (Week 1): Pt will complete bed<>w/c transfers with mod assist +1. PT Short Term Goal 2 (Week 1): Pt will ambulate 50 ft with max assist +1 & LRAD. PT Short Term Goal 3 (Week 1): Pt will initiate stair training.  Week 2:     Skilled Therapeutic Interventions/Progress Updates:  Session 1 Pt received supine in bed and agreeable to PT. Semirecumbent>sit transfer with supervision assist and min cues.   PT instructed pt in dressing EOB with min assist for clothing management and mod assist for sit<>stand to pull pants to waist.  Stand pivot trasnfer to [facility] with mod assist for safety. Pt noted to drag LLE to prevent SLS on the RLE.   Kinetron reciprocal movement training sitting in WC. 4 x45 sec at 30cm/sec. Min cues for full ROM on the LLE.   Patient returned to room and left sitting in [facility] with call bell in reach and all needs met.   Session 2.  Pt received sitting in WC and agreeable to PT. Pt initially perseverating on need to put remove in travel bag, redirected with great effort.   Pt transported to rehab gym.   Sit<>stand from PT and Mat table with mod assist x 5 and x 2 from WC; moderate cues from PT for proper use of BUE as well as increased weight shifting.   Gait training with RW instructed by PT 68f +8 ft with min fading to mod assist. Pt noted to have mild R knee instability and diminishing step length/height on the LLE. PT applied AFO to RLE to attempt to improve knee stability, initially demonstrated significant improvement in LLE step length, then unable to advance in any direction after ambulating 673f Max multimodal cues for turns due to increasing apraxia with fatigue.   [name] and all needs met.            Therapy Documentation Precautions:  Precautions Precautions: Fall Precaution Comments: L inattention, L hemi, very fearful of falling Restrictions Weight Bearing Restrictions: No Vital Signs: Therapy Vitals Temp: 97.7 F (36.5 C) Temp Source: Oral Pulse Rate: 83 Resp: 18 BP: (!) 163/82 Patient Position (if appropriate): Lying Oxygen Therapy SpO2: 97 % O2 Device: Room Air Pain: Denies.   See Function Navigator for Current Functional Status.   Therapy/Group: Individual Therapy  AuLorie Phenix/23/2019, 8:37 AM

## 2017-06-24 NOTE — Progress Notes (Addendum)
[name] is a 72 y.o. female May 28, 1945 [id number]  Subjective: No new complaints. No new problems.   Objective: Vital signs in last 24 hours: Temp:  [97.7 F (36.5 C)-98.2 F (36.8 C)] 98.2 F (36.8 C) (03/23 1449) Pulse Rate:  [53-85] 53 (03/23 1449) Resp:  [16-18] 18 (03/23 1449) BP: (105-163)/(66-96) 148/96 (03/23 1449) SpO2:  [97 %-98 %] 98 % (03/23 1449) Weight change:  Last BM Date: 06/24/17  Intake/Output from previous day: 03/22 0701 - 03/23 0700 In: 720 [P.O.:720] Out: 300 [Urine:300] Last cbgs: CBG (last 3)  Recent Labs    06/24/17 0647 06/24/17 1152 06/24/17 1637  GLUCAP 138* 222* 157*     Physical Exam General: No apparent distress   HEENT: not dry Lungs: Normal effort. Lungs clear to auscultation, no crackles or wheezes. Cardiovascular: Regular rate and rhythm, no edema Abdomen: S/NT/ND; BS(+) Musculoskeletal:  unchanged Neurological: No new neurological deficits Wounds: N/A    Skin: clear  Aging changes Mental state: Alert, cooperative    Lab Results: BMET    Component Value Date/Time   NA 143 06/20/2017 0609   NA 140 04/19/2017 0947   NA 138 07/17/2015 1325   NA 141 07/08/2014 0935   K 3.5 06/20/2017 0609   K 3.7 07/17/2015 1325   K 4.1 07/08/2014 0935   CL 109 06/20/2017 0609   CL 100 07/17/2015 1325   CO2 24 06/20/2017 0609   CO2 26 07/17/2015 1325   CO2 26 07/08/2014 0935   GLUCOSE 152 (H) 06/20/2017 0609   GLUCOSE 102 07/17/2015 1325   BUN 16 06/20/2017 0609   BUN 24 04/19/2017 0947   BUN 16 07/17/2015 1325   BUN 14.1 07/08/2014 0935   CREATININE 0.80 06/20/2017 0609   CREATININE 0.75 05/11/2016 0938   CREATININE 0.7 07/08/2014 0935   CALCIUM 9.4 06/20/2017 0609   CALCIUM 9.6 07/17/2015 1325   CALCIUM 9.3 07/08/2014 0935   GFRNONAA >60 06/20/2017 0609   GFRAA >60 06/20/2017 0609   CBC    Component Value Date/Time   WBC 8.7 06/20/2017 0609   RBC 4.77 06/20/2017 0609   HGB 11.4 (L) 06/20/2017 0609   HGB 10.9  (L) 04/19/2017 0947   HGB 11.8 08/17/2015 1318   HGB 12.2 07/08/2014 0934   HCT 38.0 06/20/2017 0609   HCT 35.2 04/19/2017 0947   HCT 40.1 08/17/2015 1318   HCT 39.7 07/08/2014 0934   PLT 248 06/20/2017 0609   PLT 343 04/19/2017 0947   MCV 79.7 06/20/2017 0609   MCV 78 (L) 04/19/2017 0947   MCV 75 (L) 08/17/2015 1318   MCV 78.8 (L) 07/08/2014 0934   MCH 23.9 (L) 06/20/2017 0609   MCHC 30.0 06/20/2017 0609   RDW 17.8 (H) 06/20/2017 0609   RDW 18.0 (H) 04/19/2017 0947   RDW 22.3 (H) 07/17/2015 1325   RDW 16.0 (H) 07/08/2014 0934   LYMPHSABS 1.9 06/20/2017 0609   LYMPHSABS 1.9 08/17/2015 1318   LYMPHSABS 2.4 07/08/2014 0934   MONOABS 0.7 06/20/2017 0609   MONOABS 1.1 (H) 07/08/2014 0934   EOSABS 0.2 06/20/2017 0609   EOSABS 0.2 08/17/2015 1318   BASOSABS 0.0 06/20/2017 0609   BASOSABS 0.0 08/17/2015 1318   BASOSABS 0.1 07/08/2014 0934    Studies/Results: No results found.  Medications: I have reviewed the patient's current medications.  Assessment/Plan:    1.  Right PCA/MCA infarct. CIR 2.  DVT prophylaxis with Xarelto 3.  Chronic low back pain/right knee pain/arthritis.  Tylenol as needed; Norco  is on hold.  Restarted gabapentin 4.  Atrial fibrillation.  On Xarelto.  On metoprolol and Cardizem.  Rate controlled 5.  Rheumatoid arthritis.  On arava and a low-dose prednisone 6.  GERD.  On Protonix 7.  Type 2 diabetes - Amaryl was resumed 8.  Right knee effusion noted on the x-ray previously.  Currently on topical diclofenac.  Steroid injection is being considered. 9.  Depression - on Wellbutrin       Length of stay, days: 5  Walker Kehr , MD 06/24/2017, 4:47 PM

## 2017-06-25 ENCOUNTER — Inpatient Hospital Stay (HOSPITAL_COMMUNITY): Payer: Medicare Other

## 2017-06-25 LAB — GLUCOSE, CAPILLARY
GLUCOSE-CAPILLARY: 116 mg/dL — AB (ref 65–99)
GLUCOSE-CAPILLARY: 200 mg/dL — AB (ref 65–99)
GLUCOSE-CAPILLARY: 226 mg/dL — AB (ref 65–99)
Glucose-Capillary: 159 mg/dL — ABNORMAL HIGH (ref 65–99)

## 2017-06-25 NOTE — Progress Notes (Signed)
[name] is a 72 y.o. female [birth year] [id number]  Subjective: No new complaints. No new problems. Slept well. Feeling OK.  Objective: Vital signs in last 24 hours: Temp:  [97.5 F (36.4 C)-98.2 F (36.8 C)] 97.5 F (36.4 C) (03/24 0130) Pulse Rate:  [53-80] 80 (03/24 0837) Resp:  [12-18] 12 (03/24 0130) BP: (144-155)/(78-96) 155/78 (03/24 0837) SpO2:  [96 %-98 %] 96 % (03/24 0130) Weight change:  Last BM Date: 06/25/17  Intake/Output from previous day: 03/23 0701 - 03/24 0700 In: 720 [P.O.:720] Out: -  Last cbgs: CBG (last 3)  Recent Labs    06/24/17 2110 06/25/17 0634 06/25/17 1203  GLUCAP 180* 116* 200*     Physical Exam General: No apparent distress   HEENT: not dry Lungs: Normal effort. Lungs clear to auscultation, no crackles or wheezes. Cardiovascular: Regular rate and rhythm, no edema Abdomen: S/NT/ND; BS(+) Musculoskeletal:  unchanged Neurological: No new neurological deficits Wounds: N/A    Skin: clear  Aging changes Mental state: Alert, cooperative In a wheelchair    Lab Results: BMET    Component Value Date/Time   NA 143 06/20/2017 0609   NA 140 04/19/2017 0947   NA 138 07/17/2015 1325   NA 141 07/08/2014 0935   K 3.5 06/20/2017 0609   K 3.7 07/17/2015 1325   K 4.1 07/08/2014 0935   CL 109 06/20/2017 0609   CL 100 07/17/2015 1325   CO2 24 06/20/2017 0609   CO2 26 07/17/2015 1325   CO2 26 07/08/2014 0935   GLUCOSE 152 (H) 06/20/2017 0609   GLUCOSE 102 07/17/2015 1325   BUN 16 06/20/2017 0609   BUN 24 04/19/2017 0947   BUN 16 07/17/2015 1325   BUN 14.1 07/08/2014 0935   CREATININE 0.80 06/20/2017 0609   CREATININE 0.75 05/11/2016 0938   CREATININE 0.7 07/08/2014 0935   CALCIUM 9.4 06/20/2017 0609   CALCIUM 9.6 07/17/2015 1325   CALCIUM 9.3 07/08/2014 0935   GFRNONAA >60 06/20/2017 0609   GFRAA >60 06/20/2017 0609   CBC    Component Value Date/Time   WBC 8.7 06/20/2017 0609   RBC 4.77 06/20/2017 0609   HGB 11.4 (L)  06/20/2017 0609   HGB 10.9 (L) 04/19/2017 0947   HGB 11.8 08/17/2015 1318   HGB 12.2 07/08/2014 0934   HCT 38.0 06/20/2017 0609   HCT 35.2 04/19/2017 0947   HCT 40.1 08/17/2015 1318   HCT 39.7 07/08/2014 0934   PLT 248 06/20/2017 0609   PLT 343 04/19/2017 0947   MCV 79.7 06/20/2017 0609   MCV 78 (L) 04/19/2017 0947   MCV 75 (L) 08/17/2015 1318   MCV 78.8 (L) 07/08/2014 0934   MCH 23.9 (L) 06/20/2017 0609   MCHC 30.0 06/20/2017 0609   RDW 17.8 (H) 06/20/2017 0609   RDW 18.0 (H) 04/19/2017 0947   RDW 22.3 (H) 07/17/2015 1325   RDW 16.0 (H) 07/08/2014 0934   LYMPHSABS 1.9 06/20/2017 0609   LYMPHSABS 1.9 08/17/2015 1318   LYMPHSABS 2.4 07/08/2014 0934   MONOABS 0.7 06/20/2017 0609   MONOABS 1.1 (H) 07/08/2014 0934   EOSABS 0.2 06/20/2017 0609   EOSABS 0.2 08/17/2015 1318   BASOSABS 0.0 06/20/2017 0609   BASOSABS 0.0 08/17/2015 1318   BASOSABS 0.1 07/08/2014 0934    Studies/Results: No results found.  Medications: I have reviewed the patient's current medications.  Assessment/Plan:    1.  Status post right PCA/MCA stroke.  CIR 2.  DVT prophylaxis-Xarelto 3.  Chronic low back pain/right  knee pain/arthritis.  Tylenol as needed Norco is being held.  On gabapentin 4.  Atrial fibrillation.  Xarelto.  On metoprolol and Cardizem.  Heart rate is controlled 5.  Rheumatoid arthritis. On [place].  Low-dose prednisone 6.  GERD-on Protonix 7.  Type 2 diabetes.  On Amaryl 8.  Right knee effusion.  On topical diclofenac.  Steroid injection is being considered. 9.  Depression-on Wellbutrin       Length of stay, days: 6  Walker Kehr , MD 06/25/2017, 12:34 PM

## 2017-06-25 NOTE — Progress Notes (Signed)
Physical Therapy Session Note  Patient Details  Name: [name] MRN: [id number] Date of Birth: Feb 12, 1946  Today's Date: 06/25/2017 PT Individual Time: 1130-1200 PT Individual Time Calculation (min): 30 min   Short Term Goals: Week 1:  PT Short Term Goal 1 (Week 1): Pt will complete bed<>w/c transfers with mod assist +1. PT Short Term Goal 2 (Week 1): Pt will ambulate 50 ft with max assist +1 & LRAD. PT Short Term Goal 3 (Week 1): Pt will initiate stair training.   Skilled Therapeutic Interventions/Progress Updates:    Session focused on NMR to address transitional movements during sit <> stands, postural control and balance retraining during gait with RW, and visual scanning and functional dynamic balance seated position during use of Dynavision. Pt required mod assist for sit <> stands with facilitation for anterior weightshift and cues for hand placement. During gait in straight line required min assist for balance and facilitation of weigthshift, then pt disoriented in direction and require min physical but max verbal cues to reorient to straight path in hallway. First attempt x 25' and second attempt only able to go about 5' before requesting to sit back down due to fatigue. Pt's partner provided w/c follow to increase confidence with gait. Engaged in dynavision task on program B with focus to distinguish red lights with R hand and green lights with L hand from seated position to promote L attention, visual scanning, and balance retraining. Pt with decreased ability to attend to L visual field with reaction times up to 4.5 seconds or pt missed light completely in both fields. Best response with lights directing in front of pt at midline or slightly R.   Therapy Documentation Precautions:  Precautions Precautions: Fall Precaution Comments: L inattention, L hemi, very fearful of falling Restrictions Weight Bearing Restrictions: No Pain:  No reports of pain.    See Function  Navigator for Current Functional Status.   Therapy/Group: Individual Therapy  Canary Brim Ivory Broad, PT, DPT  06/25/2017, 12:32 PM

## 2017-06-26 ENCOUNTER — Inpatient Hospital Stay (HOSPITAL_COMMUNITY): Payer: Medicare Other

## 2017-06-26 ENCOUNTER — Inpatient Hospital Stay (HOSPITAL_COMMUNITY): Payer: Medicare Other | Admitting: Occupational Therapy

## 2017-06-26 ENCOUNTER — Inpatient Hospital Stay (HOSPITAL_COMMUNITY): Payer: Medicare Other | Admitting: Physical Therapy

## 2017-06-26 LAB — BASIC METABOLIC PANEL
ANION GAP: 11 (ref 5–15)
BUN: 16 mg/dL (ref 6–20)
CO2: 24 mmol/L (ref 22–32)
Calcium: 9.5 mg/dL (ref 8.9–10.3)
Chloride: 107 mmol/L (ref 101–111)
Creatinine, Ser: 0.94 mg/dL (ref 0.44–1.00)
GFR calc Af Amer: >60 mL/min (ref >60)
GFR, EST NON AFRICAN AMERICAN: 59 mL/min — AB (ref >60)
Glucose, Bld: 148 mg/dL — ABNORMAL HIGH (ref 65–99)
POTASSIUM: 4 mmol/L (ref 3.5–5.1)
SODIUM: 142 mmol/L (ref 135–145)

## 2017-06-26 LAB — CBC
HEMATOCRIT: 40.4 % (ref 36.0–46.0)
HEMOGLOBIN: 11.9 g/dL — AB (ref 12.0–15.0)
MCH: 23.6 pg — ABNORMAL LOW (ref 26.0–34.0)
MCHC: 29.5 g/dL — ABNORMAL LOW (ref 30.0–36.0)
MCV: 80.2 fL (ref 78.0–100.0)
Platelets: 290 10*3/uL (ref 150–400)
RBC: 5.04 MIL/uL (ref 3.87–5.11)
RDW: 18.3 % — AB (ref 11.5–15.5)
WBC: 7.8 10*3/uL (ref 4.0–10.5)

## 2017-06-26 LAB — GLUCOSE, CAPILLARY
GLUCOSE-CAPILLARY: 201 mg/dL — AB (ref 65–99)
GLUCOSE-CAPILLARY: 177 mg/dL — AB (ref 65–99)
Glucose-Capillary: 105 mg/dL — ABNORMAL HIGH (ref 65–99)
Glucose-Capillary: 140 mg/dL — ABNORMAL HIGH (ref 65–99)

## 2017-06-26 NOTE — Progress Notes (Signed)
Subjective/Complaints: More alert , memory improving per OT  ROS:  Denies CP,SOB, N/V/D, poor awareness of deficits   Objective: Vital Signs: Blood pressure (!) 136/113, pulse 88, temperature 98.1 F (36.7 C), temperature source Oral, resp. rate 16, height 5\' 7"  (1.702 m), weight 78 kg (171 lb 15.3 oz), SpO2 98 %. No results found. Results for orders placed or performed during the hospital encounter of 06/19/17 (from the past 72 hour(s))  Glucose, capillary     Status: Abnormal   Collection Time: 06/23/17 11:24 AM  Result Value Ref Range   Glucose-Capillary 177 (H) 65 - 99 mg/dL  Glucose, capillary     Status: Abnormal   Collection Time: 06/23/17  4:37 PM  Result Value Ref Range   Glucose-Capillary 181 (H) 65 - 99 mg/dL  Glucose, capillary     Status: Abnormal   Collection Time: 06/23/17  9:33 PM  Result Value Ref Range   Glucose-Capillary 179 (H) 65 - 99 mg/dL   Comment 1 Notify RN   Glucose, capillary     Status: Abnormal   Collection Time: 06/24/17  6:47 AM  Result Value Ref Range   Glucose-Capillary 138 (H) 65 - 99 mg/dL   Comment 1 Notify RN   Glucose, capillary     Status: Abnormal   Collection Time: 06/24/17 11:52 AM  Result Value Ref Range   Glucose-Capillary 222 (H) 65 - 99 mg/dL  Glucose, capillary     Status: Abnormal   Collection Time: 06/24/17  4:37 PM  Result Value Ref Range   Glucose-Capillary 157 (H) 65 - 99 mg/dL  Glucose, capillary     Status: Abnormal   Collection Time: 06/24/17  9:10 PM  Result Value Ref Range   Glucose-Capillary 180 (H) 65 - 99 mg/dL  Glucose, capillary     Status: Abnormal   Collection Time: 06/25/17  6:34 AM  Result Value Ref Range   Glucose-Capillary 116 (H) 65 - 99 mg/dL  Glucose, capillary     Status: Abnormal   Collection Time: 06/25/17 12:03 PM  Result Value Ref Range   Glucose-Capillary 200 (H) 65 - 99 mg/dL  Glucose, capillary     Status: Abnormal   Collection Time: 06/25/17  4:43 PM  Result Value Ref Range    Glucose-Capillary 159 (H) 65 - 99 mg/dL  Glucose, capillary     Status: Abnormal   Collection Time: 06/25/17 10:29 PM  Result Value Ref Range   Glucose-Capillary 226 (H) 65 - 99 mg/dL  Glucose, capillary     Status: Abnormal   Collection Time: 06/26/17  6:15 AM  Result Value Ref Range   Glucose-Capillary 105 (H) 65 - 99 mg/dL     HEENT: normal Cardio: RRR and no murmur Resp: CTA B/L and unlabored GI: BS positive and Non Tender and Non distended Extremity:  No Edema Skin:   Intact Neuro: Lethargic, Normal Sensory, Normal Motor, Inattention and Other left homonymous hemianopsia Musc/Skel:  Other Ulnar drift bilateral MCPs, Bouchard's deformity Right index finger, RIgh tknee effusion with tenderness Gen nad   Assessment/Plan: 1. Functional deficits secondary to Right PCA/post MCA infarct which require 3+ hours per day of interdisciplinary therapy in a comprehensive inpatient rehab setting. Physiatrist is providing close team supervision and 24 hour management of active medical problems listed below. Physiatrist and rehab team continue to assess barriers to discharge/monitor patient progress toward functional and medical goals. FIM: Function - Bathing Position: Shower Body parts bathed by patient: Right arm, Right lower leg, Left lower  leg, Left arm, Chest, Abdomen, Front perineal area, Right upper leg, Left upper leg Body parts bathed by helper: Buttocks Assist Level: Touching or steadying assistance(Pt > 75%)  Function- Upper Body Dressing/Undressing What is the patient wearing?: Pull over shirt/dress Pull over shirt/dress - Perfomed by patient: Thread/unthread right sleeve, Put head through opening, Pull shirt over trunk, Thread/unthread left sleeve Pull over shirt/dress - Perfomed by helper: Thread/unthread left sleeve Assist Level: Touching or steadying assistance(Pt > 75%) Function - Lower Body Dressing/Undressing What is the patient wearing?: Pants, Non-skid slipper  socks Position: Wheelchair/chair at sink Underwear - Performed by patient: Thread/unthread right underwear leg, Thread/unthread left underwear leg, Pull underwear up/down Pants- Performed by patient: Pull pants up/down, Thread/unthread right pants leg Pants- Performed by helper: Thread/unthread left pants leg Non-skid slipper socks- Performed by patient: Don/doff right sock, Don/doff left sock Socks - Performed by patient: Don/doff right sock, Don/doff left sock Shoes - Performed by patient: Don/doff right shoe, Don/doff left shoe, Fasten right, Fasten left Assist for footwear: Supervision/touching assist Assist for lower body dressing: Touching or steadying assistance (Pt > 75%)  Function - Toileting Toileting steps completed by patient: Adjust clothing prior to toileting, Performs perineal hygiene, Adjust clothing after toileting Toileting steps completed by helper: Adjust clothing after toileting Toileting Assistive Devices: Grab bar or rail Assist level: Touching or steadying assistance (Pt.75%)  Function - Air cabin crew transfer assistive device: Grab bar Assist level to toilet: Moderate assist (Pt 50 - 74%/lift or lower) Assist level from toilet: Moderate assist (Pt 50 - 74%/lift or lower) Assist level to bedside commode (at bedside): Touching or steadying assistance (Pt > 75%) Assist level from bedside commode (at bedside): Touching or steadying assistance (Pt > 75%)  Function - Chair/bed transfer Chair/bed transfer activity did not occur: Safety/medical concerns Chair/bed transfer method: Stand pivot Chair/bed transfer assist level: Moderate assist (Pt 50 - 74%/lift or lower) Chair/bed transfer assistive device: Bedrails Chair/bed transfer details: Tactile cues for sequencing, Tactile cues for weight shifting, Tactile cues for posture, Tactile cues for initiation, Tactile cues for placement, Verbal cues for technique, Manual facilitation for placement, Manual  facilitation for weight shifting, Verbal cues for sequencing  Function - Locomotion: Wheelchair Wheelchair activity did not occur: Safety/medical concerns Assist Level: (P) Total assistance (Pt < 25%) Wheel 50 feet with 2 turns activity did not occur: Safety/medical concerns Wheel 150 feet activity did not occur: Safety/medical concerns Function - Locomotion: Ambulation Assistive device: Walker-rolling Max distance: 25' Assist level: Touching or steadying assistance (Pt > 75%) Assist level: Touching or steadying assistance (Pt > 75%) Walk 50 feet with 2 turns activity did not occur: Safety/medical concerns Walk 150 feet activity did not occur: Safety/medical concerns Walk 10 feet on uneven surfaces activity did not occur: Safety/medical concerns  Function - Comprehension Comprehension: Auditory Comprehension assist level: Understands basic 50 - 74% of the time/ requires cueing 25 - 49% of the time  Function - Expression Expression: Verbal Expression assist level: Expresses basic 50 - 74% of the time/requires cueing 25 - 49% of the time. Needs to repeat parts of sentences.  Function - Social Interaction Social Interaction assist level: Interacts appropriately 50 - 74% of the time - May be physically or verbally inappropriate.  Function - Problem Solving Problem solving assist level: Solves basic 25 - 49% of the time - needs direction more than half the time to initiate, plan or complete simple activities  Function - Memory Memory assist level: Recognizes or recalls less than 25% of  the time/requires cueing greater than 75% of the time, Recognizes or recalls 25 - 49% of the time/requires cueing 50 - 75% of the time Patient normally able to recall (first 3 days only): That he or she is in a hospital, Current season  Medical Problem List and Plan: 1.Functional and visual-spatial deficitssecondary to right PCA/MCA infarct -CIR PT, OT, SLP  2. DVT  Prophylaxis/Anticoagulation: Contxarelto 20mg  /day 3.Chronic LBP/Right knee pain/Pain Management:tylenol prn and local measures. Continue to hold Norco and restart gabapentin for Sciatic pain 4. Mood:LCSW to follow for evaluation and support.Continue Wellbutrin bid 5. Neuropsych: This patientis not fullycapable of making decisions onherown behalf. 6. Skin/Wound Care:routine pressure relief measures 7. Fluids/Electrolytes/Nutrition:Monitor I/O. Check lytes in am.  8. A fib: Monitor HR bid. Continue Metoprolol bid with Cardizem daily. .  Vitals:   06/25/17 1619 06/25/17 2040  BP: (!) 143/82 (!) 136/113  Pulse: 66 88  Resp: 16   Temp: 98.1 F (36.7 C)   SpO2: 98%   Elevated diastolic with narrow pulse pressure, will monitor trend 9. RA: managed with Arava and low dose prednisone.  10. GERD: On Protonix.  11.Hgb A1C- 6.9: T2DM: Monitor BS ac/hs. Resume Amaryl as po intake is good--continue to hold metformin for now.  CBG (last 3)  Recent Labs    06/25/17 1643 06/25/17 2229 06/26/17 0615  GLUCAP 159* 226* 105*  Adequate control 3/25 12.  Hypoalb- add prostat 13.  Right knee effusion noted since Xray 2/28, change diclofenac gel to QID, consider injection of corticosteroid, no oral NSAIDs due to CVA and Xarelto  LOS (Days) 7 A FACE TO FACE EVALUATION WAS PERFORMED  Charlett Blake 06/26/2017, 7:51 AM

## 2017-06-26 NOTE — Progress Notes (Signed)
Physical Therapy Session Note  Patient Details  Name: [name] MRN: [id number] Date of Birth: 07-23-1945  Today's Date: 06/26/2017 PT Individual Time: 1401-1446 PT Individual Time Calculation (min): 45 min   Short Term Goals: Week 1:  PT Short Term Goal 1 (Week 1): Pt will complete bed<>w/c transfers with mod assist +1. PT Short Term Goal 1 - Progress (Week 1): Met PT Short Term Goal 2 (Week 1): Pt will ambulate 50 ft with max assist +1 & LRAD. PT Short Term Goal 2 - Progress (Week 1): Not met PT Short Term Goal 3 (Week 1): Pt will initiate stair training.  PT Short Term Goal 3 - Progress (Week 1): Not met  Skilled Therapeutic Interventions/Progress Updates:   Pt received in w/c, talking with recreational therapist, agreeable to therapy. Pt denies pain. Therapist propelled pt in w/c to gym. Pt performs sit<>stand modA w/ RW and verbal cuing for hand placement and sequencing. After a few trials, pt able to stand and perform marching. After building pt confidence, she is able to ambulate 60f then 30 ft w/ RW mod A. Pt demonstrated decreased bilateral step length during gait. Therapist giving frequent cues for pt to walk towards center of hallway, but tends to veer right 2/2 L inattention. Pt performs Kinetron seated in w/c at 60cm/s for BLE strengthening and endurance. Pt propelled w/c with LE 40 ft with cuing for sequencing. Therapist propelled pt in w/c rest of way to room for time management. Pt left seated in w/c, alarm on, quick release belt on, and all needs within reach. Throughout session, pt appeared to fall asleep and needed to be aroused by therapist.   Therapy Documentation Precautions:  Precautions Precautions: Fall Precaution Comments: L inattention, L hemi, very fearful of falling Restrictions Weight Bearing Restrictions: No   See Function Navigator for Current Functional Status.   Therapy/Group: Individual Therapy  LCaffie Damme3/25/2019, 4:07 PM

## 2017-06-26 NOTE — Progress Notes (Signed)
Occupational Therapy Session Note  Patient Details  Name: LAURAL EILAND MRN: [id number] Date of Birth: [birth year]  Today's Date: 06/26/2017 OT Individual Time: 0730-0830 OT Individual Time Calculation (min): 60 min    Short Term Goals: Week 1:  OT Short Term Goal 1 (Week 1): Pt will complete toilet transfer with min A using LRAD OT Short Term Goal 2 (Week 1): Pt will correctly orient shirt mod I OT Short Term Goal 3 (Week 1): Pt will stand to complete 2 grooming tasks in order to increase functional activity tolerance OT Short Term Goal 4 (Week 1): Pt will locate grooming item on L sink ledge with min quesitoning cues  Skilled Therapeutic Interventions/Progress Updates:    Pt seen for OT ADL bathing/dressing session. Pt sitting up in w/c upon arrival, agreeable to tx session. She was oriented to place and situation, also able to recall name of this therapist who she worked with last week.  She completed min A stand pivot transfers throughout session with use of grab bars, multimodal cuing for hand palcement and sequencing. She bathed seated on tub bench, with min cuing able to locate all needed items and used B UEs at independent level to open small containers.  She returned to w/c to dress, required max A for orientation of underwear/pants and to ensure once correctly oriented she threaded LEs into correct pants hole. She required min A initially for dynamic standing balanace standing at RW to pull pants up, progressing to mod A due to posterior lean, unable to self correct with VCs.  With slightlt increased time she was able to correctly orient and don shirt independently.  Grooming tasks completed standing at sink, required mod-max A to stand from w/c to sink. Min steadying assist required for balance and increased time to locate items on L sink ledge.  Pt left sitting up in w/c at end of session, set-up with breakfast tray and QRB donned, RN made aware of pt's position.  Pt demonstrated  improved attention and orientation throughout session today compared to previous tx sessions.   Therapy Documentation Precautions:  Precautions Precautions: Fall Precaution Comments: L inattention, L hemi, very fearful of falling Restrictions Weight Bearing Restrictions: No Pain: Pain Assessment Pain Scale: 0-10 Pain Score: No/denies pain  See Function Navigator for Current Functional Status.   Therapy/Group: Individual Therapy  Yevonne Yokum L 06/26/2017, 7:08 AM

## 2017-06-26 NOTE — Progress Notes (Signed)
Physical Therapy Weekly Progress Note  Patient Details  Name: [name] MRN: [id number] Date of Birth: 1946/03/07  Beginning of progress report period: June 20, 2017 End of progress report period: June 27, 2017  Today's Date: 06/27/2017  Patient has met 1 of 3 short term goals.  Pt is making inconsistent progress towards goals. Pt continues to be limited by significantly impaired cognition that fluctuates daily as well as L inattention. Pt is also extremely fearful of falling 2/2 hx of multiple falls and is limited by significant, preexisting R knee pain. Pt has been able to ambulate very short distances with RW & mod/min assist. Pt would benefit from continued skilled PT treatment to focus on BLE strengthening, NMR, transfers, gait, and balance. Pt will need 24 hr assist upon d/c and caregivers would benefit from hands on training prior to pt's d/c to ensure safe d/c plan.   Patient continues to demonstrate the following deficits muscle weakness, decreased cardiorespiratoy endurance, ataxia, decreased coordination and decreased motor planning, decreased visual acuity, decreased visual perceptual skills and field cut, decreased attention to left, decreased initiation, decreased attention, decreased awareness, decreased problem solving, decreased safety awareness, decreased memory and delayed processing, and decreased sitting balance, decreased standing balance, decreased postural control, hemiplegia and decreased balance strategies and therefore will continue to benefit from skilled PT intervention to increase functional independence with mobility.  Patient is making inconsistent progress towards LTG's. Pt's current goals are min assist ambulatory but depending on cognition and gains over the next week goals may have to be downgraded to w/c level or increased assistance.   PT Short Term Goals Week 1:  PT Short Term Goal 1 (Week 1): Pt will complete bed<>w/c transfers with mod assist +1. PT  Short Term Goal 1 - Progress (Week 1): Met PT Short Term Goal 2 (Week 1): Pt will ambulate 50 ft with max assist +1 & LRAD. PT Short Term Goal 2 - Progress (Week 1): Not met PT Short Term Goal 3 (Week 1): Pt will initiate stair training.  PT Short Term Goal 3 - Progress (Week 1): Not met Week 2:  PT Short Term Goal 1 (Week 2): Pt will ambulate 50 ft with mod assist +1 & LRAD. PT Short Term Goal 2 (Week 2): Pt will initiate stair training.  PT Short Term Goal 3 (Week 2): Pt will propel w/c with min assist x 50 ft.    Therapy Documentation Precautions:  Precautions Precautions: Fall Precaution Comments: L inattention, L hemi, very fearful of falling Restrictions Weight Bearing Restrictions: No   See Function Navigator for Current Functional Status.  Therapy/Group: Individual Therapy  Waunita Schooner 06/27/2017, 7:57 AM

## 2017-06-26 NOTE — Progress Notes (Signed)
Speech Language Pathology Daily Session Note  Patient Details  Name: [name] MRN: [id number] Date of Birth: [birth year]  Today's Date: 06/26/2017 SLP Individual Time: 8416-6063 SLP Individual Time Calculation (min): 45 min  Short Term Goals: Week 1: SLP Short Term Goal 1 (Week 1): Pt will demonstrate sustained attention to functional tasks for 10 minutes with min A verbal cues for redirection, SLP Short Term Goal 2 (Week 1): Pt will demonstrate increase awareness by answering yes/no questions regrading deficits due to CVA with supervision A verbal/question cues.  SLP Short Term Goal 3 (Week 1): Pt will demonstrate functional problem solving for basic and familiar tasks with Max A verbal cues. SLP Short Term Goal 4 (Week 1): Pt will demonstrate auditory comprehension of 1-2 step commands during functional tasks in 50% of opportunties with Mod A verbal/question cues. SLP Short Term Goal 5 (Week 1): Pt will scan to midline in 50% of opportunties with Max A verbal/visual cues.  SLP Short Term Goal 6 (Week 1): Pt will utilize external memory aids to recall new,daily information with Max A verbal/question cues.   Skilled Therapeutic Interventions: Skilled ST services focused on cognitive skills. SLP facilitated recall of daily events, creating a memory book, after pt was unable to recall pervious therapy sessions events. SLP recorded events in notebook utilizing large print and assist with visual tracking blocking off sections of the paper to allow pt to focus on one word at a time. Pt demonstrated inconsistent ability to visual locate/decode words and required Max A verbal/visual cues even with placement to the right. Pt demonstrated recall of events with mod A verbal cues following initial instruction and required total-max A verbal cues to utilize memory book, therefore not be appropriate memory tool at this time. Pt demonstrated increase in sustained attention up to 30 minutes then requiring  max-mod A verbal cues. SLP facilitated function problem solving utilizing cell phone, pt required max A verbal/visual cues to operate phone at basic level, due to cognitive and visual deficits. SLP set speed dial for boyfriend "Grayland Ormond", pt returned demonstration of accessing sped dial with max A verbal/visual cues. Pt was left in room with call bell within reach and safety belt on. Recommend to continue skilled ST services.  Function:  Eating Eating                 Cognition Comprehension Comprehension assist level: Understands basic 50 - 74% of the time/ requires cueing 25 - 49% of the time;Understands basic 75 - 89% of the time/ requires cueing 10 - 24% of the time  Expression   Expression assist level: Expresses basic 50 - 74% of the time/requires cueing 25 - 49% of the time. Needs to repeat parts of sentences.;Expresses basic 75 - 89% of the time/requires cueing 10 - 24% of the time. Needs helper to occlude trach/needs to repeat words.  Social Interaction Social Interaction assist level: Interacts appropriately 50 - 74% of the time - May be physically or verbally inappropriate.  Problem Solving Problem solving assist level: Solves basic 25 - 49% of the time - needs direction more than half the time to initiate, plan or complete simple activities  Memory Memory assist level: Recognizes or recalls 25 - 49% of the time/requires cueing 50 - 75% of the time    Pain Pain Assessment Pain Score: 0-No pain  Therapy/Group: Individual Therapy  Ignacio Lowder  [facility] 06/26/2017, 2:34 PM

## 2017-06-26 NOTE — Progress Notes (Signed)
Physical Therapy Session Note  Patient Details  Name: [name] MRN: [id number] Date of Birth: Feb 15, 1946  Today's Date: 06/26/2017 PT Individual Time: 0935-1015  PT Individual Time Calculation (min): 40 min  Short Term Goals: Week 1:  PT Short Term Goal 1 (Week 1): Pt will complete bed<>w/c transfers with mod assist +1. PT Short Term Goal 1 - Progress (Week 1): Met PT Short Term Goal 2 (Week 1): Pt will ambulate 50 ft with max assist +1 & LRAD. PT Short Term Goal 2 - Progress (Week 1): Not met PT Short Term Goal 3 (Week 1): Pt will initiate stair training.  PT Short Term Goal 3 - Progress (Week 1): Not met  Skilled Therapeutic Interventions/Progress Updates:  Pt received in w/c & agreeable to tx. Pt reporting R knee pain but states she has pain releiving gel that helps. Pt attempting to call son but having difficulty; pt agreeable to therapist assisting her at end of session. Transported pt to gym via w/c total assist for time management. Pt requires min<>max assist for sit>stand and max assist stand>sit transfers with multimodal cuing for hand placement and sequencing as pt with difficulty pushing up; pt also has absent eccentric control and safety awareness when transferring to sit. Gait x 15 ft + 15 ft with RW & min assist with pt demonstrating step-to pattern and very minimal weight shifting RLE; pt with decreased step length BLE, decreased stride length and decreased gait speed but no right knee buckling noted on this date. Pt engaged in standing activity x 5 minutes with task focusing on activity tolerance and reaching outside of BOS with pt maintaining 1UE hold on RW. Pt required significant cuing to name 50% of plastic animals during task and continues to weight bear primarily through LLE. At end of session pt returned to room & assisted with calling son (pt unable to recall that she wanted to). Pt left in w/c with QRB donned & needs within reach.   Therapy  Documentation Precautions:  Precautions Precautions: Fall Precaution Comments: L inattention, L hemi, very fearful of falling Restrictions Weight Bearing Restrictions: No   See Function Navigator for Current Functional Status.   Therapy/Group: Individual Therapy  Waunita Schooner 06/26/2017, 2:13 PM

## 2017-06-27 ENCOUNTER — Inpatient Hospital Stay (HOSPITAL_COMMUNITY): Payer: Medicare Other | Admitting: Physical Therapy

## 2017-06-27 ENCOUNTER — Inpatient Hospital Stay (HOSPITAL_COMMUNITY): Payer: Medicare Other

## 2017-06-27 ENCOUNTER — Inpatient Hospital Stay (HOSPITAL_COMMUNITY): Payer: Medicare Other | Admitting: *Deleted

## 2017-06-27 ENCOUNTER — Inpatient Hospital Stay (HOSPITAL_COMMUNITY): Payer: Medicare Other | Admitting: Occupational Therapy

## 2017-06-27 LAB — GLUCOSE, CAPILLARY
GLUCOSE-CAPILLARY: 210 mg/dL — AB (ref 65–99)
GLUCOSE-CAPILLARY: 210 mg/dL — AB (ref 65–99)
Glucose-Capillary: 135 mg/dL — ABNORMAL HIGH (ref 65–99)
Glucose-Capillary: 185 mg/dL — ABNORMAL HIGH (ref 65–99)

## 2017-06-27 MED ORDER — PROSIGHT PO TABS
1.0000 | ORAL_TABLET | Freq: Two times a day (BID) | ORAL | Status: DC
  Administered 2017-06-27 – 2017-07-05 (×16): 1 via ORAL
  Filled 2017-06-27 (×16): qty 1

## 2017-06-27 NOTE — Progress Notes (Signed)
Subjective/Complaints:  No issues overnite  ROS:  Denies CP,SOB, N/V/D, poor awareness of deficits   Objective: Vital Signs: Blood pressure (!) 156/81, pulse 80, temperature 98.9 F (37.2 C), temperature source Oral, resp. rate 16, height '5\' 7"'  (1.702 m), weight 78 kg (171 lb 15.3 oz), SpO2 98 %. No results found. Results for orders placed or performed during the hospital encounter of 06/19/17 (from the past 72 hour(s))  Glucose, capillary     Status: Abnormal   Collection Time: 06/24/17 11:52 AM  Result Value Ref Range   Glucose-Capillary 222 (H) 65 - 99 mg/dL  Glucose, capillary     Status: Abnormal   Collection Time: 06/24/17  4:37 PM  Result Value Ref Range   Glucose-Capillary 157 (H) 65 - 99 mg/dL  Glucose, capillary     Status: Abnormal   Collection Time: 06/24/17  9:10 PM  Result Value Ref Range   Glucose-Capillary 180 (H) 65 - 99 mg/dL  Glucose, capillary     Status: Abnormal   Collection Time: 06/25/17  6:34 AM  Result Value Ref Range   Glucose-Capillary 116 (H) 65 - 99 mg/dL  Glucose, capillary     Status: Abnormal   Collection Time: 06/25/17 12:03 PM  Result Value Ref Range   Glucose-Capillary 200 (H) 65 - 99 mg/dL  Glucose, capillary     Status: Abnormal   Collection Time: 06/25/17  4:43 PM  Result Value Ref Range   Glucose-Capillary 159 (H) 65 - 99 mg/dL  Glucose, capillary     Status: Abnormal   Collection Time: 06/25/17 10:29 PM  Result Value Ref Range   Glucose-Capillary 226 (H) 65 - 99 mg/dL  Glucose, capillary     Status: Abnormal   Collection Time: 06/26/17  6:15 AM  Result Value Ref Range   Glucose-Capillary 105 (H) 65 - 99 mg/dL  Glucose, capillary     Status: Abnormal   Collection Time: 06/26/17 12:03 PM  Result Value Ref Range   Glucose-Capillary 201 (H) 65 - 99 mg/dL  Glucose, capillary     Status: Abnormal   Collection Time: 06/26/17  4:50 PM  Result Value Ref Range   Glucose-Capillary 140 (H) 65 - 99 mg/dL  Basic metabolic panel      Status: Abnormal   Collection Time: 06/26/17  6:26 PM  Result Value Ref Range   Sodium 142 135 - 145 mmol/L   Potassium 4.0 3.5 - 5.1 mmol/L   Chloride 107 101 - 111 mmol/L   CO2 24 22 - 32 mmol/L   Glucose, Bld 148 (H) 65 - 99 mg/dL   BUN 16 6 - 20 mg/dL   Creatinine, Ser 0.94 0.44 - 1.00 mg/dL   Calcium 9.5 8.9 - 10.3 mg/dL   GFR calc non Af Amer 59 (L) >60 mL/min   GFR calc Af Amer >60 >60 mL/min    Comment: (NOTE) The eGFR has been calculated using the CKD EPI equation. This calculation has not been validated in all clinical situations. eGFR's persistently <60 mL/min signify possible Chronic Kidney Disease.    Anion gap 11 5 - 15    Comment: Performed at [facility] [address], [place] [address]  CBC     Status: Abnormal   Collection Time: 06/26/17  6:26 PM  Result Value Ref Range   WBC 7.8 4.0 - 10.5 K/uL   RBC 5.04 3.87 - 5.11 MIL/uL   Hemoglobin 11.9 (L) 12.0 - 15.0 g/dL   HCT 40.4 36.0 -  46.0 %   MCV 80.2 78.0 - 100.0 fL   MCH 23.6 (L) 26.0 - 34.0 pg   MCHC 29.5 (L) 30.0 - 36.0 g/dL   RDW 18.3 (H) 11.5 - 15.5 %   Platelets 290 150 - 400 K/uL    Comment: Performed at [facility] [address] [place], [place] [address]  Glucose, capillary     Status: Abnormal   Collection Time: 06/26/17  9:15 PM  Result Value Ref Range   Glucose-Capillary 177 (H) 65 - 99 mg/dL  Glucose, capillary     Status: Abnormal   Collection Time: 06/27/17  6:40 AM  Result Value Ref Range   Glucose-Capillary 135 (H) 65 - 99 mg/dL     HEENT: normal Cardio: RRR and no murmur Resp: CTA B/L and unlabored GI: BS positive and Non Tender and Non distended Extremity:  No Edema Skin:   Intact Neuro: Lethargic, Normal Sensory, Normal Motor, Inattention and Other left homonymous hemianopsia Musc/Skel:  Other Ulnar drift bilateral MCPs, Bouchard's deformity Right index finger, RIgh tknee effusion with tenderness Gen nad   Assessment/Plan: 1. Functional deficits  secondary to Right PCA/post MCA infarct which require 3+ hours per day of interdisciplinary therapy in a comprehensive inpatient rehab setting. Physiatrist is providing close team supervision and 24 hour management of active medical problems listed below. Physiatrist and rehab team continue to assess barriers to discharge/monitor patient progress toward functional and medical goals. FIM: Function - Bathing Position: Shower Body parts bathed by patient: Right arm, Right lower leg, Left lower leg, Left arm, Chest, Abdomen, Front perineal area, Right upper leg, Left upper leg, Buttocks Body parts bathed by helper: Back Assist Level: Touching or steadying assistance(Pt > 75%)  Function- Upper Body Dressing/Undressing What is the patient wearing?: Pull over shirt/dress Pull over shirt/dress - Perfomed by patient: Thread/unthread right sleeve, Put head through opening, Pull shirt over trunk, Thread/unthread left sleeve Pull over shirt/dress - Perfomed by helper: Thread/unthread left sleeve Assist Level: Supervision or verbal cues Function - Lower Body Dressing/Undressing What is the patient wearing?: Pants, Underwear, Socks, Shoes Position: Wheelchair/chair at [employer] - Performed by patient: Thread/unthread right underwear leg, Thread/unthread left underwear leg, Pull underwear up/down Pants- Performed by patient: Pull pants up/down, Thread/unthread right pants leg, Thread/unthread left pants leg Pants- Performed by helper: Thread/unthread left pants leg Non-skid slipper socks- Performed by patient: Don/doff right sock, Don/doff left sock Socks - Performed by patient: Don/doff right sock, Don/doff left sock Shoes - Performed by patient: Don/doff right shoe, Don/doff left shoe, Fasten right, Fasten left Assist for footwear: Supervision/touching assist Assist for lower body dressing: Touching or steadying assistance (Pt > 75%)  Function - Toileting Toileting steps completed by patient:  Adjust clothing prior to toileting, Performs perineal hygiene, Adjust clothing after toileting Toileting steps completed by helper: Adjust clothing prior to toileting, Performs perineal hygiene, Adjust clothing after toileting Toileting Assistive Devices: Grab bar or rail Assist level: Touching or steadying assistance (Pt.75%)  Function - Air cabin crew transfer assistive device: Grab bar Assist level to toilet: Moderate assist (Pt 50 - 74%/lift or lower) Assist level from toilet: Touching or steadying assistance (Pt > 75%) Assist level to bedside commode (at bedside): Touching or steadying assistance (Pt > 75%) Assist level from bedside commode (at bedside): Touching or steadying assistance (Pt > 75%)  Function - Chair/bed transfer Chair/bed transfer activity did not occur: Safety/medical concerns Chair/bed transfer method: Stand pivot Chair/bed transfer assist level: Moderate assist (Pt 50 -  74%/lift or lower) Chair/bed transfer assistive device: Bedrails Chair/bed transfer details: Tactile cues for sequencing, Tactile cues for weight shifting, Tactile cues for posture, Tactile cues for initiation, Tactile cues for placement, Verbal cues for technique, Manual facilitation for placement, Manual facilitation for weight shifting, Verbal cues for sequencing  Function - Locomotion: Wheelchair Wheelchair activity did not occur: Safety/medical concerns Max wheelchair distance: 45f(w/ BLE propulsion ) Assist Level: Supervision or verbal cues Wheel 50 feet with 2 turns activity did not occur: Safety/medical concerns Wheel 150 feet activity did not occur: Safety/medical concerns Function - Locomotion: Ambulation Assistive device: Walker-rolling Max distance: 30 ft Assist level: Moderate assist (Pt 50 - 74%) Assist level: Moderate assist (Pt 50 - 74%) Walk 50 feet with 2 turns activity did not occur: Safety/medical concerns Walk 150 feet activity did not occur: Safety/medical  concerns Walk 10 feet on uneven surfaces activity did not occur: Safety/medical concerns  Function - Comprehension Comprehension: Auditory Comprehension assist level: Understands basic 50 - 74% of the time/ requires cueing 25 - 49% of the time  Function - Expression Expression: Verbal Expression assist level: Expresses basic 50 - 74% of the time/requires cueing 25 - 49% of the time. Needs to repeat parts of sentences.  Function - Social Interaction Social Interaction assist level: Interacts appropriately 50 - 74% of the time - May be physically or verbally inappropriate.  Function - Problem Solving Problem solving assist level: Solves basic 75 - 89% of the time/requires cueing 10 - 24% of the time  Function - Memory Memory assist level: Recognizes or recalls 50 - 74% of the time/requires cueing 25 - 49% of the time Patient normally able to recall (first 3 days only): That he or she is in a hospital, Current season  Medical Problem List and Plan: 1.Functional and visual-spatial deficitssecondary to right PCA/MCA infarct -CIR PT, OT, SLP team conf in am  2. DVT Prophylaxis/Anticoagulation: Contxarelto 269m/day 3.Chronic LBP/Right knee pain/Pain Management:tylenol prn and local measures. Continue to hold Norco and restart gabapentin for Sciatic pain 4. Mood:LCSW to follow for evaluation and support.Continue Wellbutrin bid 5. Neuropsych: This patientis not fullycapable of making decisions onherown behalf. 6. Skin/Wound Care:routine pressure relief measures 7. Fluids/Electrolytes/Nutrition:Monitor I/O. Check lytes in am.  8. A fib: Monitor HR bid. Continue Metoprolol bid with Cardizem daily. .  Vitals:   06/27/17 0232 06/27/17 0739  BP: 140/69 (!) 156/81  Pulse: 80   Resp: 16   Temp: 98.9 F (37.2 C)   SpO2: 98%   Elevated diastolic with narrow pulse pressure, will monitor trend 9. RA: managed with Arava and low dose prednisone.  10. GERD: On  Protonix.  11.Hgb A1C- 6.9: T2DM: Monitor BS ac/hs. Resume Amaryl as po intake is good--continue to hold metformin for now.  CBG (last 3)  Recent Labs    06/26/17 1650 06/26/17 2115 06/27/17 0640  GLUCAP 140* 177* 135*  Adequate control 3/26 12.  Hypoalb- add prostat 13.  Right knee effusion noted since Xray 2/28, change diclofenac gel to QID, consider injection of corticosteroid, no oral NSAIDs due to CVA and Xarelto  LOS (Days) 8 A FACE TO FACE EVALUATION WAS PERFORMED  AnCharlett Blake/26/2019, 7:39 AM

## 2017-06-27 NOTE — Progress Notes (Signed)
Occupational Therapy Session Note  Patient Details  Name: [name] MRN: [id number] Date of Birth: 11/14/1945  Today's Date: 06/27/2017 OT Individual Time: 1045-1200 OT Individual Time Calculation (min): 75 min    Short Term Goals: Week 1:  OT Short Term Goal 1 (Week 1): Pt will complete toilet transfer with min A using LRAD OT Short Term Goal 2 (Week 1): Pt will correctly orient shirt mod I OT Short Term Goal 3 (Week 1): Pt will stand to complete 2 grooming tasks in order to increase functional activity tolerance OT Short Term Goal 4 (Week 1): Pt will locate grooming item on L sink ledge with min quesitoning cues  Skilled Therapeutic Interventions/Progress Updates:    Pt seen for OT session focusing on ADL retraining, functional ambulation, standing endurance/ balance, and functional transfers. Pt sitting up in w/c upon arrival, oriented x4 and agreeable to tx session. She declined need for bathing/dressing this morning.  She ambulated ~91ft using RW to sink and with steadying assist completed oral care from standing position. Increased time for pt to utilize scanning technique to locate needed items on L side of sink ledge. She tolerated standing ~2  Minutes to complete task with CGA.  Throughout session, pt required mod-max A for sit <> stand due to poor descentric control. Utilized "rocking" technique for sit>stand with improved abilities. In therapy gym, completed peg board activity from standing position. Pt required to replicate moderatly challenging pattern, obtaining needed items from bin placed on L. She required overall max A for pattern recognition and min cuing to locate items on L. Pt tolerated ~3-4 minutes of standing each trial before requiring seated rest break. Completed x4 trials in order to finish task in entirety.  In ADL apartment, completed simulated tub/shower transfer utilizing tub bench in simulation of home environment. Following demonstration, pt return  demonstrated, able to manage B LEs over tub wall from seated position, however, required max A for transfer from w/c >RW> tub bench. Pt returned to room at end of session, with min cuing able to recall all events of session in order to write down in memory notebook. Pt left seated in w/c with QRB donned, friend and NT present with all needs in reach.    Therapy Documentation Precautions:  Precautions Precautions: Fall Precaution Comments: L inattention, L hemi, very fearful of falling Restrictions Weight Bearing Restrictions: No Pain:   No/denies pain  See Function Navigator for Current Functional Status.   Therapy/Group: Individual Therapy  Dalylah Ramey L 06/27/2017, 7:39 AM

## 2017-06-27 NOTE — Progress Notes (Signed)
Speech Language Pathology Weekly Progress and Session Note  Patient Details  Name: [name] MRN: [id number] Date of Birth: Aug 26, 1945  Beginning of progress report period: June 20, 2017 End of progress report period: June 27, 2017  Today's Date: 06/27/2017 SLP Individual Time: 0800-0830 SLP Individual Time Calculation (min): 30 min  Short Term Goals: Week 1: SLP Short Term Goal 1 (Week 1): Pt will demonstrate sustained attention to functional tasks for 10 minutes with min A verbal cues for redirection, SLP Short Term Goal 1 - Progress (Week 1): Met SLP Short Term Goal 2 (Week 1): Pt will demonstrate increase awareness by answering yes/no questions regrading deficits due to CVA with supervision A verbal/question cues.  SLP Short Term Goal 2 - Progress (Week 1): Met SLP Short Term Goal 3 (Week 1): Pt will demonstrate functional problem solving for basic and familiar tasks with Max A verbal cues. SLP Short Term Goal 3 - Progress (Week 1): Met SLP Short Term Goal 4 (Week 1): Pt will demonstrate auditory comprehension of 1-2 step commands during functional tasks in 50% of opportunties with Mod A verbal/question cues. SLP Short Term Goal 4 - Progress (Week 1): Met SLP Short Term Goal 5 (Week 1): Pt will scan to midline in 50% of opportunties with Max A verbal/visual cues.  SLP Short Term Goal 5 - Progress (Week 1): Met SLP Short Term Goal 6 (Week 1): Pt will utilize external memory aids to recall new,daily information with Max A verbal/question cues.  SLP Short Term Goal 6 - Progress (Week 1): Met    New Short Term Goals: Week 2: SLP Short Term Goal 1 (Week 2): Pt will demonstrate sustained attention to functional tasks for 20 minutes with mod A verbal cues for redirection, SLP Short Term Goal 2 (Week 2): Pt will identify 2 physical and 2 cognitive deficits due to CVA with mod A verbal cues. SLP Short Term Goal 3 (Week 2): Pt will demonstrate functional problem solving for basic and  familiar tasks with mod A verbal cues. SLP Short Term Goal 4 (Week 2): Pt will demonstrate auditory comprehension of 1-2 step commands during functional tasks in 75% of opportunties with Mod A verbal/question cues. SLP Short Term Goal 5 (Week 2): Pt will scan to midline in 50% of opportunties with Mod A verbal/visual cues.  SLP Short Term Goal 6 (Week 2): Pt will utilize external memory aids to recall new, daily information with mod A verbal/question cues.   Weekly Progress Updates:Pt demonstrated good progress meeting 6 out 6 goals, however requiring maximum   assist due to inconsistent behaviors/abilties. Pt demonstrates slight increase in sustained attention, scaninng to midline, following basic commands, basic problem solving and awareness of deficits. SLP downgraded LTG to Mod A due to inconsistent behaviors/abilties and often falling asleep instantaneously. Therapy staff has addressed concerns with nursing staff. Family education needs to be completed. Pt would continue to benefit from skilled ST services in order to maximize functional independence and reduce burden of care prior to discharge requiring likely mod A for cognitive function with 24/hour supervision.     Intensity: Minumum of 1-2 x/day, 30 to 90 minutes Frequency: 3 to 5 out of 7 days Duration/Length of Stay: 4/5 Treatment/Interventions: Cognitive remediation/compensation;Cueing hierarchy;Functional tasks;Internal/external aids;Patient/family education   Daily Session Skilled Therapeutic Interventions: Skilled ST services focused on cognitive skills. SLP and OT assisted pt in transfer from bed to chair requiring Max-Mod A verbal cues to follow 1 step directions. SLP facilitated functional problem solving  navigating breakfast tray, pt demonstrated ability to locate objects to the left with mod A verbal/visual cues for tracking and min A verbal cues for problem solving during PO consumption. SLP facilitated awareness of deficits with  yes/no questions pt required supervision cues, however required max A verbal cues when presented in open question format. Pt demonstrated increase sustained attention during am session, min A verbal cues in 20 minute intervals, suggesting attention could be impacted by medication/early sundowning. Pt was left in room with call bell within reach. Reccomend to continue skilled ST services.       Function:   Eating Eating                 Cognition Comprehension Comprehension assist level: Understands basic 50 - 74% of the time/ requires cueing 25 - 49% of the time  Expression   Expression assist level: Expresses basic 75 - 89% of the time/requires cueing 10 - 24% of the time. Needs helper to occlude trach/needs to repeat words.  Social Interaction Social Interaction assist level: Interacts appropriately 50 - 74% of the time - May be physically or verbally inappropriate.  Problem Solving Problem solving assist level: Solves basic 75 - 89% of the time/requires cueing 10 - 24% of the time  Memory Memory assist level: Recognizes or recalls 50 - 74% of the time/requires cueing 25 - 49% of the time   General    Pain Pain Assessment Pain Score: 6  Pain Location: Knee Pain Orientation: Right  Therapy/Group: Individual Therapy  Tyger Oka  [facility] 06/27/2017, 3:58 PM

## 2017-06-27 NOTE — Progress Notes (Addendum)
Physical Therapy Session Note  Patient Details  Name: [name] MRN: [id number] Date of Birth: [birth year]  Today's Date: 06/27/2017 PT Individual Time: 1400-1433 PT Individual Time Calculation (min): 33 min   Short Term Goals: Week 1:  PT Short Term Goal 1 (Week 1): Pt will complete bed<>w/c transfers with mod assist +1. PT Short Term Goal 1 - Progress (Week 1): Met PT Short Term Goal 2 (Week 1): Pt will ambulate 50 ft with max assist +1 & LRAD. PT Short Term Goal 2 - Progress (Week 1): Not met PT Short Term Goal 3 (Week 1): Pt will initiate stair training.  PT Short Term Goal 3 - Progress (Week 1): Not met  Skilled Therapeutic Interventions/Progress Updates:   Pt received in w/c, agreeable to therapy. Pt c/o mild pain in R knee. Therapist propelled pt to gym for time management. Pt performs sit<>stands with RW, requiring significant encouragement and verbal cuing for sequencing, and mod A. Pt leans significantly posterior and to the left during sit<>stands 2/2 fear of falling and decreased anterior lean. Therapist educated pt on importance of anterior weight shift during sit<>stand. Pt able to stand and perform marching to weight shift but required seated rest break. Pt became emotional while talking about her family and states she misses them a lot. Pt states she enjoys coloring and is able to stand for ~7-8 min to color at standing table to increase standing tolerance and static standing balance. Pt demonstrates significant anterior lean and weight bearing through BUE on standing table but requires steady assist for balance. Therapist propelled pt in w/c back to room, was left seated in w/c, quick release belt on, and all needs within reach.    Therapy Documentation Precautions:  Precautions Precautions: Fall Precaution Comments: L inattention, L hemi, very fearful of falling Restrictions Weight Bearing Restrictions: No    See Function Navigator for Current Functional  Status.   Therapy/Group: Individual Therapy  Caffie Damme 06/27/2017, 3:43 PM

## 2017-06-27 NOTE — Progress Notes (Signed)
Physical Therapy Session Note  Patient Details  Name: [name] MRN: [id number] Date of Birth: [birth year]  Today's Date: 06/27/2017 PT Individual Time: 0933-1000 PT Individual Time Calculation (min): 27 min   Short Term Goals: Week 2:  PT Short Term Goal 1 (Week 2): Pt will ambulate 50 ft with mod assist +1 & LRAD. PT Short Term Goal 2 (Week 2): Pt will initiate stair training.  PT Short Term Goal 3 (Week 2): Pt will propel w/c with min assist x 50 ft.   Skilled Therapeutic Interventions/Progress Updates:  Pt received in w/c & agreeable to tx, no c/o pain reported. Pt holding cell phone up to ear but reports there's no one on the line, "they hung up awhile ago" and therapist had to instruct pt to lay phone on table. Pt able to recall correct date independently but 2 minutes later unable to recall correct date. Pt states "I need a calendar" and therapist provided her with March & April calendars on her wall to assist with orientation. Pt completes sit>stand with mod assist with therapist providing manual facilitation for anterior weight shifting during task. Pt ambulates 15 ft + 6 ft with RW & min assist with slightly improved weight shifting to R but still with step-to pattern and decreased step length, stride length & gait speed. Pt requires max cuing for forward gaze but with poor return demo. At end of session pt left sitting in w/c in room with QRB donned & needs within reach.  Therapy Documentation Precautions:  Precautions Precautions: Fall Precaution Comments: L inattention, L hemi, very fearful of falling Restrictions Weight Bearing Restrictions: No   See Function Navigator for Current Functional Status.   Therapy/Group: Individual Therapy  Waunita Schooner 06/27/2017, 12:12 PM

## 2017-06-27 NOTE — Progress Notes (Signed)
Recreational Therapy Session Note  Patient Details  Name: [name] MRN: [id number] Date of Birth: 1945-05-16 Today's Date: 06/27/2017  Pain: c/o back pain-6/10 seated in w/c- 10/10 with activity- nursing and nursing student made aware of pt's request for tylenol; offered repositioning including lying down in her bed until next therapy session, but pt declined.  Skilled Therapeutic Interventions/Progress Updates: Pt tearful today but LRT attempted continued leisure assessment.  Assisted pt in completing notes in memory notebook, pt required mod cues.  Pt tearful stating she needed to get better so she could go home.  Pt also tearful when talking about cousin who visited today and brought her flowers.  When questioned about tearfulness, pt stated she needed to "stop getting up with those girls in the morning."  Pt further explained that she was limited by on-going back pain.  She felt like this was related to multiple falls PTA.  Encouraged pt to share this with treatment team including RN & MD so that pain could be addressed and she could better tolerate her therapy sessions.  Nursing student & LPN made aware of pt's back pain.    Eval deferred at this time.  Will continue to monitor through team for appropriateness.   Therapy/Group: Individual Therapy   Finlee Milo 06/27/2017, 3:34 PM

## 2017-06-27 NOTE — Progress Notes (Signed)
Physical Therapy Session Note  Patient Details  Name: [name] MRN: [id number] Date of Birth: 1945-05-17  Today's Date: 06/27/2017 PT Individual Time: 7543-6067 PT Individual Time Calculation (min): 43 min   Short Term Goals: Week 2:  PT Short Term Goal 1 (Week 2): Pt will ambulate 50 ft with mod assist +1 & LRAD. PT Short Term Goal 2 (Week 2): Pt will initiate stair training.  PT Short Term Goal 3 (Week 2): Pt will propel w/c with min assist x 50 ft.   Skilled Therapeutic Interventions/Progress Updates:   Pt in w/c and agreeable to therapy, no c/o pain. Total assist w/c transport to/from gym. Worked on cognitive tasks in requiring attention to task, attending to L side of environment, and sequencing. Performed tasks in stance w/ unilateral UE support and min guard for safety. Performed multiple sit<>stands to high/low table w/ min guard and verbal cues for technique, pt very nervous about standing. Worked on functional mobility w/ w/c, instructed pt on self-propelled w/ BUEs. Attempted w/ BLEs, however unable to sequence task despite max cues, more success w/ BUEs. Verbal and visual cues for obstacle avoidance, especialy on L side. Returned to room and transferred to/from toilet w/ mod assist utilizing grab bars in bathroom. Supervision for preicare. Ended session in w/c, call bell within reach and all needs met. QRB donned.   Therapy Documentation Precautions:  Precautions Precautions: Fall Precaution Comments: L inattention, L hemi, very fearful of falling Restrictions Weight Bearing Restrictions: No Vital Signs: Therapy Vitals Temp: 98.7 F (37.1 C) Temp Source: Oral Pulse Rate: 87 Resp: 16 BP: 124/75 Patient Position (if appropriate): Sitting Oxygen Therapy SpO2: 98 % O2 Device: Room Air Pain: Pain Assessment Pain Score: 3  Faces Pain Scale: Hurts a little bit Pain Location: Knee Pain Orientation: Right  See Function Navigator for Current Functional  Status.   Therapy/Group: Individual Therapy  Caramia Boutin K Arnette 06/27/2017, 4:59 PM

## 2017-06-28 ENCOUNTER — Inpatient Hospital Stay (HOSPITAL_COMMUNITY): Payer: Medicare Other

## 2017-06-28 ENCOUNTER — Inpatient Hospital Stay (HOSPITAL_COMMUNITY): Payer: Medicare Other | Admitting: Physical Therapy

## 2017-06-28 ENCOUNTER — Inpatient Hospital Stay (HOSPITAL_COMMUNITY): Payer: Medicare Other | Admitting: Occupational Therapy

## 2017-06-28 LAB — GLUCOSE, CAPILLARY
GLUCOSE-CAPILLARY: 116 mg/dL — AB (ref 65–99)
Glucose-Capillary: 160 mg/dL — ABNORMAL HIGH (ref 65–99)
Glucose-Capillary: 133 mg/dL — ABNORMAL HIGH (ref 65–99)
Glucose-Capillary: 217 mg/dL — ABNORMAL HIGH (ref 65–99)

## 2017-06-28 NOTE — Progress Notes (Signed)
Speech Language Pathology Daily Session Note  Patient Details  Name: [name] MRN: [id number] Date of Birth: 08-Mar-1946  Today's Date: 06/28/2017 SLP Individual Time: 1200-1300 SLP Individual Time Calculation (min): 60 min  Short Term Goals: Week 2: SLP Short Term Goal 1 (Week 2): Pt will demonstrate sustained attention to functional tasks for 20 minutes with mod A verbal cues for redirection, SLP Short Term Goal 2 (Week 2): Pt will identify 2 physical and 2 cognitive deficits due to CVA with mod A verbal cues. SLP Short Term Goal 3 (Week 2): Pt will demonstrate functional problem solving for basic and familiar tasks with mod A verbal cues. SLP Short Term Goal 4 (Week 2): Pt will demonstrate auditory comprehension of 1-2 step commands during functional tasks in 75% of opportunties with Mod A verbal/question cues. SLP Short Term Goal 5 (Week 2): Pt will scan to midline in 50% of opportunties with Mod A verbal/visual cues.  SLP Short Term Goal 6 (Week 2): Pt will utilize external memory aids to recall new, daily information with mod A verbal/question cues.   Skilled Therapeutic Interventions:Skilled ST services focused on cognitive skills. SLP facilitated functional problem solving with lunch tray, pt required min A verbal/tactile cues, however when she required assistance, SLP brought error to awareness, pt was unable to correct error requring Total A. Pt demonstrated increase in sustained attention and awareness of deficits during session. Pt demonstrated ability to maintain conversation, remaining on topic and reamin alert for 40 minutes, after this time pt intermittently began to fall asleep. Pt verbalized awareness of lost of alertness given question prompts. Pt demonstrated awareness of deficits, stating that she believes she is unsafe to be alone and is concerned about her families abilities to provide enough care, this is an increase of awareness that has not been noted in pervious  sessions. Pt able to name visual and balance deficit given open ended question prompts and confirmed 2 more cognitive and physical defvcicits given yes/no questions. Pt was left in room with call bell within reach. Reccomend to continue skilled ST services.      Function:  Eating Eating                 Cognition Comprehension Comprehension assist level: Understands basic 75 - 89% of the time/ requires cueing 10 - 24% of the time  Expression   Expression assist level: Expresses basic 75 - 89% of the time/requires cueing 10 - 24% of the time. Needs helper to occlude trach/needs to repeat words.  Social Interaction Social Interaction assist level: Interacts appropriately 75 - 89% of the time - Needs redirection for appropriate language or to initiate interaction.  Problem Solving Problem solving assist level: Solves basic 75 - 89% of the time/requires cueing 10 - 24% of the time  Memory Memory assist level: Recognizes or recalls 50 - 74% of the time/requires cueing 25 - 49% of the time    Pain Pain Assessment Pain Score: 0-No pain  Therapy/Group: Individual Therapy  Jametta Moorehead  [facility] 06/28/2017, 1:24 PM

## 2017-06-28 NOTE — Progress Notes (Signed)
Occupational Therapy Session Note  Patient Details  Name: [name] MRN: [id number] Date of Birth: 31-Jan-1946  Today's Date: 06/28/2017 OT Individual Time: 0900-0930 OT Individual Time Calculation (min): 30 min    Short Term Goals: Week 1:  OT Short Term Goal 1 (Week 1): Pt will complete toilet transfer with min A using LRAD OT Short Term Goal 2 (Week 1): Pt will correctly orient shirt mod I OT Short Term Goal 3 (Week 1): Pt will stand to complete 2 grooming tasks in order to increase functional activity tolerance OT Short Term Goal 4 (Week 1): Pt will locate grooming item on L sink ledge with min quesitoning cues  Skilled Therapeutic Interventions/Progress Updates:    Pt seen for OT session focusing on orientation, recall, and functional standing balance/endurance. Pt sitting up in w/c upon arrival, confused as she could not find pen for writing in her journal. Pen provided and pt wrote down in memory book her conversation with MD this AM able to recall topics discussed. Pt required VCs for orientation of day and locating correct spot in sequencing of memory notebook based on activities already written from today.  In therapy day room, completed standing towel folding activity, pt required to obtain towel from chair level on L and stood to fold towel focusing on maintaining dynamic standing balance and utilization of B UEs. Completed with CGA and mod cuing for attention to task once one towel followed in order to advance on to next towel. Pt tolerated ~10 minutes in standing before seated rest break provided. Pt also minimally distracted by imperfections of seams in towel, however, with min cuing to redirect able to cont on with activity.  Pt left seated in w/c at end of session with QRB donned, awaiting hand off to SLP.   Therapy Documentation Precautions:  Precautions Precautions: Fall Precaution Comments: L inattention, L hemi, very fearful of falling Restrictions Weight Bearing  Restrictions: No Pain:   No/denies pain  See Function Navigator for Current Functional Status.   Therapy/Group: Individual Therapy  Chistopher Mangino L 06/28/2017, 10:02 AM

## 2017-06-28 NOTE — Progress Notes (Addendum)
Occupational Therapy Session Note  Patient Details  Name: [name] MRN: [id number] Date of Birth: [birth year]  Today's Date: 06/28/2017 OT Individual Time: 0730-0830 OT Individual Time Calculation (min): 60 min    Short Term Goals: Week 1:  OT Short Term Goal 1 (Week 1): Pt will complete toilet transfer with min A using LRAD OT Short Term Goal 1 - Progress (Week 1): Progressing toward goal OT Short Term Goal 2 (Week 1): Pt will correctly orient shirt mod I OT Short Term Goal 2 - Progress (Week 1): Met OT Short Term Goal 3 (Week 1): Pt will stand to complete 2 grooming tasks in order to increase functional activity tolerance OT Short Term Goal 3 - Progress (Week 1): Met OT Short Term Goal 4 (Week 1): Pt will locate grooming item on L sink ledge with min quesitoning cues OT Short Term Goal 4 - Progress (Week 1): Met  Skilled Therapeutic Interventions/Progress Updates:    Pt presented EOB eating breakfast with nursing staff present, no c/o pain. She was able to locate food items (oatmeal) to the left of her tray. She agreed to ADL bathing session, she sorted clothing in bags and was able to find all clothing needed. She was pushed into the bathroom in w/c and completed toilet and shower transfers stand pivot with grab bar requiring Mod VCs for foot placement prior to standing. During bathing she required Min VCs for location of water gage and shower head but was able to locate them for the remainder of the session. For UB and LB dressing she was able to doff and donn clothing and shoes with Min VCs for clothing orientation and standing with the use of grab bar to pull pants up. She then stood at the sink using RW to perform grooming activities, she was able to locate the brush to the left on the sink and brush her teeth requiring supervision. She completed the session by entering in the activities and date in her memory book and was able to recall the breakfast, showering and dressing with OT  but required cueing on what day it is. Pt presented more aware of her surroundings (visual) and improved dressing skills. She was left in the w/c with safety belt and call bell within reach.   Therapy Documentation Precautions:  Precautions Precautions: Fall Precaution Comments: L inattention, L hemi, very fearful of falling Restrictions Weight Bearing Restrictions: No    Therapy/Group: Individual Therapy  Vilinda Blanks 06/28/2017, 12:07 PM

## 2017-06-28 NOTE — Progress Notes (Signed)
Physical Therapy Session Note  Patient Details  Name: [name] MRN: [id number] Date of Birth: June 16, 1945  Today's Date: 06/28/2017 PT Individual Time: 1305-1400 PT Individual Time Calculation (min): 55 min   Short Term Goals: Week 2:  PT Short Term Goal 1 (Week 2): Pt will ambulate 50 ft with mod assist +1 & LRAD. PT Short Term Goal 2 (Week 2): Pt will initiate stair training.  PT Short Term Goal 3 (Week 2): Pt will propel w/c with min assist x 50 ft.   Skilled Therapeutic Interventions/Progress Updates: Pt presented in w/c hand off from SLP and agreeable to therapy. Pt participated in s/c propulsion approx 60f with BUE and cues for increased use of LUE to maintain straight trajectory. Pt transported remaining distance for energy conservation..Marland KitchenPt participated in standing tolerance activities including ball taps with LUE durations up to 4 min before requesting to sit due to increased R knee pain. Pt noted to demonstrate increased anterior reaching throughout trials. Participated in gait x 131fwith RW and w/c follow. Pt initially demonstrating larger step length and foot clearance which decreased with fatigue. Participated in seated L scanning and mild cognitive activities with pt requiring minA for completion with more challenging tasks. Pt transported back to room at end of session and remained in w/c with call bell within reach and needs met.       Therapy Documentation Precautions:  Precautions Precautions: Fall Precaution Comments: L inattention, L hemi, very fearful of falling Restrictions Weight Bearing Restrictions: No General:   Vital Signs: Therapy Vitals Temp: 98.5 F (36.9 C) Temp Source: Oral Pulse Rate: 67(notified RN) Resp: 18 BP: 124/67 Patient Position (if appropriate): Sitting(not laying, she was sitting) Oxygen Therapy SpO2: 98 % O2 Device: Room Air Pain: Pain Assessment Pain Score: 0-No pain  See Function Navigator for Current Functional  Status.   Therapy/Group: Individual Therapy  Quinnley Colasurdo  Maud Rubendall, PTA  06/28/2017, 3:15 PM

## 2017-06-28 NOTE — Patient Care Conference (Signed)
Inpatient RehabilitationTeam Conference and Plan of Care Update Date: 06/28/2017   Time: 10:45 AM    Patient Name: [name]      Medical Record Number: [id number]  Date of Birth: 1946/03/15 Sex: Female         Room/Bed: 4W19C/4W19C-01 Payor Info: Payor: MEDICARE / Plan: MEDICARE PART A AND B / Product Type: *No Product type* /    Admitting Diagnosis: CVA  Admit Date/Time:  06/19/2017  6:20 PM Admission Comments: No comment available   Primary Diagnosis:  <principal problem not specified> Principal Problem: <principal problem not specified>  Patient Active Problem List   Diagnosis Date Noted  . Stroke, acute, embolic ([place]) 40/98/1191  . Mitral regurgitation 06/17/2017  . Acute CVA (cerebrovascular accident) ([place])   . Thyroid nodule 06/15/2017  . Atrial fibrillation with RVR ([place]) 06/14/2017  . CVA (cerebral vascular accident) ([place]) 06/14/2017  . Type II diabetes mellitus ([place])   . Peripheral vascular disease ([place])   . Hyperlipidemia   . Hiatal hernia   . Edema   . Chronic diastolic (congestive) heart failure ([place])   . Carpal tunnel syndrome   . Atrial fibrillation ([place])   . Anemia   . Actinic keratosis   . Intractable nausea and vomiting 05/31/2016  . Nausea & vomiting 05/30/2016  . Abnormal chest x-ray 05/30/2016  . Diabetes mellitus with complication ([place])   . Influenza A 05/21/2016  . Chronic atrial fibrillation ([place]) 05/20/2016  . Acute CHF (congestive heart failure) ([place]) 05/20/2016  . History of GI bleed 03/29/2016  . Chronic anticoagulation 03/29/2016  . Symptomatic anemia 03/19/2016  . Spinal stenosis of lumbar region 02/10/2016  . Varicose veins of left leg with edema 09/15/2015  . Preop cardiovascular exam 08/25/2015  . Iron deficiency anemia due to chronic blood loss 07/17/2015  . Anemia of chronic renal failure, stage 3 (moderate) ([place]) 07/17/2015  . Anticoagulant causing adverse effect in therapeutic use 07/17/2015  . Symptomatic bradycardia  02/21/2015  . Bradycardia   . Acute deep vein thrombosis (DVT) of left lower extremity ([place]) 05/06/2014  . Abdominal wall mass of right lower quadrant 07/08/2013  . OA (osteoarthritis) of hip 04/03/2013  . Osteoarthritis of hip 01/18/2013  . Night sweats 09/06/2012  . Chest pain   . Anxiety and depression   . Gastroesophageal reflux disease   . Hepatitis   . Jaundice   . Hypertension 07/01/2010  . Hypercholesterolemia 07/01/2010  . DM2 (diabetes mellitus, type 2) ([place]) 07/01/2010  . Rheumatoid arthritis ([place]) 07/01/2010  . Sinus tachycardia 07/01/2010  . Chest pain, atypical 07/01/2010    Expected Discharge Date: Expected Discharge Date: 07/07/17  Team Members Present: Physician leading conference: Dr. Alysia Penna Social Worker Present: Ovidio Kin, LCSW Nurse Present: Dorthula Nettles, RN PT Present: Barrie Folk, PT OT Present: Napoleon Form, OT SLP Present: Stormy Fabian, SLP PPS Coordinator present : Daiva Nakayama, RN, CRRN     Current Status/Progress Goal Weekly Team Focus  Medical   levft neglect , fluctuating cognition,  reduce fall risk  Cont rehab   Bowel/Bladder   continent iof bowel & bladder, LBM 06/27/17  remain continent  continue to monitor & assist as needed   Swallow/Nutrition/ Hydration             ADL's   Assist level varies within session and throughout day from CGA- max A for sit<> stand and functional transfers; min A LB bathing/dressing with mod cuing for clothing orientation, VCs for UB dressing for clothing orientation; mod  A dynamic standing balance for pt to complete 3/3 toileting tasks.   Supervision overall, may need to downgrade pending pt progress  Functional transfers, Functional standing balance/endurance, sustained attention, ADL re-training   Mobility   ambulates 12 ft with RW & min/mod assist, difficulty weight bearing through RLE, min<>max for sit>stand, max stand>sit, L inattention, impaired cognition that fluctuates, has not attempted  stair negotiation 2/2 fear  min assist overall  gait, transfers, balance, activity & standing tolerance, L attention, cognitive remediation   Communication             Safety/Cognition/ Behavioral Observations  Mod A to complete basic task d/t vastly fluctuating confusion, decreased attention  Mod A - downgraded 3/26  sustained attention, simple problem solving, left inattention   Pain   pain in right leg & knee, has tylenol prn, scheduled voltaren gel & scheduled neurontin qhs & baclofen 5mg  TID  pain scale <3  assess & treat as needed   Skin   no signs of skin bereak down  no new areas of skin break down  assess Q shift      *See Care Plan and progress notes for long and short-term goals.     Barriers to Discharge  Current Status/Progress Possible Resolutions Date Resolved   Physician    Medical stability     Progressing but inconsistent  Caregiver ed      Nursing                  PT  Behavior;Lack of/limited family support  impaired cognition, fear of falling, impaired safety awareness, L inattention              OT                  SLP                SW                Discharge Planning/Teaching Needs:  Home to son's home where they can provide 24 hr care. Will have them come in next week to prepare for discharge.      Team Discussion:  Making progress in therapies and cognitively better. Still inconsistent with her levels and requires cues. R-knee pain limits her in therapies. Working on endurance and balance issues. Will need 24 hr physical at discharge from rehab. Will try to get family in for education next week prior to discharge.  Revisions to Treatment Plan:  DC 4/5    Continued Need for Acute Rehabilitation Level of Care: The patient requires daily medical management by a physician with specialized training in physical medicine and rehabilitation for the following conditions: Daily direction of a multidisciplinary physical rehabilitation program to ensure safe  treatment while eliciting the highest outcome that is of practical value to the patient.: Yes Daily medical management of patient stability for increased activity during participation in an intensive rehabilitation regime.: Yes Daily analysis of laboratory values and/or radiology reports with any subsequent need for medication adjustment of medical intervention for : Neurological problems;Cardiac problems  Treysen Sudbeck, Gardiner Rhyme 06/28/2017, 12:59 PM

## 2017-06-28 NOTE — Progress Notes (Signed)
Subjective/Complaints:  Pt recognizes me but does not recall name  ROS:  Denies CP,SOB, N/V/D, poor awareness of deficits   Objective: Vital Signs: Blood pressure 119/64, pulse 77, temperature 98.1 F (36.7 C), temperature source Oral, resp. rate 16, height '5\' 7"'  (1.702 m), weight 77.5 kg (170 lb 13.7 oz), SpO2 99 %. No results found. Results for orders placed or performed during the hospital encounter of 06/19/17 (from the past 72 hour(s))  Glucose, capillary     Status: Abnormal   Collection Time: 06/25/17 12:03 PM  Result Value Ref Range   Glucose-Capillary 200 (H) 65 - 99 mg/dL  Glucose, capillary     Status: Abnormal   Collection Time: 06/25/17  4:43 PM  Result Value Ref Range   Glucose-Capillary 159 (H) 65 - 99 mg/dL  Glucose, capillary     Status: Abnormal   Collection Time: 06/25/17 10:29 PM  Result Value Ref Range   Glucose-Capillary 226 (H) 65 - 99 mg/dL  Glucose, capillary     Status: Abnormal   Collection Time: 06/26/17  6:15 AM  Result Value Ref Range   Glucose-Capillary 105 (H) 65 - 99 mg/dL  Glucose, capillary     Status: Abnormal   Collection Time: 06/26/17 12:03 PM  Result Value Ref Range   Glucose-Capillary 201 (H) 65 - 99 mg/dL  Glucose, capillary     Status: Abnormal   Collection Time: 06/26/17  4:50 PM  Result Value Ref Range   Glucose-Capillary 140 (H) 65 - 99 mg/dL  Basic metabolic panel     Status: Abnormal   Collection Time: 06/26/17  6:26 PM  Result Value Ref Range   Sodium 142 135 - 145 mmol/L   Potassium 4.0 3.5 - 5.1 mmol/L   Chloride 107 101 - 111 mmol/L   CO2 24 22 - 32 mmol/L   Glucose, Bld 148 (H) 65 - 99 mg/dL   BUN 16 6 - 20 mg/dL   Creatinine, Ser 0.94 0.44 - 1.00 mg/dL   Calcium 9.5 8.9 - 10.3 mg/dL   GFR calc non Af Amer 59 (L) >60 mL/min   GFR calc Af Amer >60 >60 mL/min    Comment: (NOTE) The eGFR has been calculated using the CKD EPI equation. This calculation has not been validated in all clinical situations. eGFR's  persistently <60 mL/min signify possible Chronic Kidney Disease.    Anion gap 11 5 - 15    Comment: Performed at [facility] [address] [place], [place] [address]  CBC     Status: Abnormal   Collection Time: 06/26/17  6:26 PM  Result Value Ref Range   WBC 7.8 4.0 - 10.5 K/uL   RBC 5.04 3.87 - 5.11 MIL/uL   Hemoglobin 11.9 (L) 12.0 - 15.0 g/dL   HCT 40.4 36.0 - 46.0 %   MCV 80.2 78.0 - 100.0 fL   MCH 23.6 (L) 26.0 - 34.0 pg   MCHC 29.5 (L) 30.0 - 36.0 g/dL   RDW 18.3 (H) 11.5 - 15.5 %   Platelets 290 150 - 400 K/uL    Comment: Performed at [facility] [address], [place], [place] [address]  Glucose, capillary     Status: Abnormal   Collection Time: 06/26/17  9:15 PM  Result Value Ref Range   Glucose-Capillary 177 (H) 65 - 99 mg/dL  Glucose, capillary     Status: Abnormal   Collection Time: 06/27/17  6:40 AM  Result Value Ref Range   Glucose-Capillary  135 (H) 65 - 99 mg/dL  Glucose, capillary     Status: Abnormal   Collection Time: 06/27/17 12:03 PM  Result Value Ref Range   Glucose-Capillary 210 (H) 65 - 99 mg/dL  Glucose, capillary     Status: Abnormal   Collection Time: 06/27/17  5:24 PM  Result Value Ref Range   Glucose-Capillary 185 (H) 65 - 99 mg/dL  Glucose, capillary     Status: Abnormal   Collection Time: 06/27/17  8:50 PM  Result Value Ref Range   Glucose-Capillary 210 (H) 65 - 99 mg/dL  Glucose, capillary     Status: Abnormal   Collection Time: 06/28/17  6:30 AM  Result Value Ref Range   Glucose-Capillary 160 (H) 65 - 99 mg/dL     HEENT: normal Cardio: RRR and no murmur Resp: CTA B/L and unlabored GI: BS positive and Non Tender and Non distended Extremity:  No Edema Skin:   Intact Neuro: Lethargic, Normal Sensory, Normal Motor, Inattention and Other left homonymous hemianopsia Musc/Skel:  Other Ulnar drift bilateral MCPs, Bouchard's deformity Right index finger, RIgh tknee effusion with tenderness Gen nad   Assessment/Plan: 1.  Functional deficits secondary to Right PCA/post MCA infarct which require 3+ hours per day of interdisciplinary therapy in a comprehensive inpatient rehab setting. Physiatrist is providing close team supervision and 24 hour management of active medical problems listed below. Physiatrist and rehab team continue to assess barriers to discharge/monitor patient progress toward functional and medical goals. FIM: Function - Bathing Position: Shower Body parts bathed by patient: Right arm, Right lower leg, Left lower leg, Left arm, Chest, Abdomen, Front perineal area, Right upper leg, Left upper leg, Buttocks Body parts bathed by helper: Back Assist Level: Touching or steadying assistance(Pt > 75%)  Function- Upper Body Dressing/Undressing What is the patient wearing?: Pull over shirt/dress Pull over shirt/dress - Perfomed by patient: Thread/unthread right sleeve, Put head through opening, Pull shirt over trunk, Thread/unthread left sleeve Pull over shirt/dress - Perfomed by helper: Thread/unthread left sleeve Assist Level: Supervision or verbal cues Function - Lower Body Dressing/Undressing What is the patient wearing?: Pants, Underwear, Socks, Shoes Position: Wheelchair/chair at [employer] - Performed by patient: Thread/unthread right underwear leg, Thread/unthread left underwear leg, Pull underwear up/down Pants- Performed by patient: Pull pants up/down, Thread/unthread right pants leg, Thread/unthread left pants leg Pants- Performed by helper: Thread/unthread left pants leg Non-skid slipper socks- Performed by patient: Don/doff right sock, Don/doff left sock Socks - Performed by patient: Don/doff right sock, Don/doff left sock Shoes - Performed by patient: Don/doff right shoe, Don/doff left shoe, Fasten right, Fasten left Assist for footwear: Supervision/touching assist Assist for lower body dressing: Touching or steadying assistance (Pt > 75%)  Function - Toileting Toileting steps  completed by patient: Adjust clothing prior to toileting, Performs perineal hygiene, Adjust clothing after toileting Toileting steps completed by helper: Adjust clothing prior to toileting, Performs perineal hygiene, Adjust clothing after toileting Toileting Assistive Devices: Grab bar or rail Assist level: Touching or steadying assistance (Pt.75%)  Function - Air cabin crew transfer assistive device: Grab bar Assist level to toilet: Supervision or verbal cues Assist level from toilet: Supervision or verbal cues Assist level to bedside commode (at bedside): Touching or steadying assistance (Pt > 75%) Assist level from bedside commode (at bedside): Touching or steadying assistance (Pt > 75%)  Function - Chair/bed transfer Chair/bed transfer activity did not occur: Safety/medical concerns Chair/bed transfer method: Stand pivot Chair/bed transfer assist level: Moderate assist (Pt 50 - 74%/lift or  lower) Chair/bed transfer assistive device: Armrests Chair/bed transfer details: Tactile cues for sequencing, Tactile cues for weight shifting, Tactile cues for posture, Tactile cues for initiation, Tactile cues for placement, Verbal cues for technique, Manual facilitation for placement, Manual facilitation for weight shifting, Verbal cues for sequencing  Function - Locomotion: Wheelchair Wheelchair activity did not occur: Safety/medical concerns Max wheelchair distance: 25' Assist Level: Supervision or verbal cues Wheel 50 feet with 2 turns activity did not occur: Safety/medical concerns Wheel 150 feet activity did not occur: Safety/medical concerns Function - Locomotion: Ambulation Assistive device: Walker-rolling Max distance: 15 ft Assist level: Touching or steadying assistance (Pt > 75%) Assist level: Touching or steadying assistance (Pt > 75%) Walk 50 feet with 2 turns activity did not occur: Safety/medical concerns Walk 150 feet activity did not occur: Safety/medical  concerns Walk 10 feet on uneven surfaces activity did not occur: Safety/medical concerns  Function - Comprehension Comprehension: Auditory Comprehension assist level: Understands basic 50 - 74% of the time/ requires cueing 25 - 49% of the time  Function - Expression Expression: Verbal Expression assist level: Expresses basic 75 - 89% of the time/requires cueing 10 - 24% of the time. Needs helper to occlude trach/needs to repeat words.  Function - Social Interaction Social Interaction assist level: Interacts appropriately 50 - 74% of the time - May be physically or verbally inappropriate.  Function - Problem Solving Problem solving assist level: Solves basic 75 - 89% of the time/requires cueing 10 - 24% of the time  Function - Memory Memory assist level: Recognizes or recalls 50 - 74% of the time/requires cueing 25 - 49% of the time Patient normally able to recall (first 3 days only): That he or she is in a hospital, Current season  Medical Problem List and Plan: 1.Functional and visual-spatial deficitssecondary to right PCA/MCA infarct -CIR PT, OT, SLP , Team conference today please see physician documentation under team conference tab, met with team face-to-face to discuss problems,progress, and goals. Formulized individual treatment plan based on medical history, underlying problem and comorbidities.  2. DVT Prophylaxis/Anticoagulation: Contxarelto 34m /day 3.Chronic LBP/Right knee pain/Pain Management:tylenol prn and local measures. Continue to hold Norco and restart gabapentin for Sciatic pain 4. Mood:LCSW to follow for evaluation and support.Continue Wellbutrin bid 5. Neuropsych: This patientis not fullycapable of making decisions onherown behalf. 6. Skin/Wound Care:routine pressure relief measures 7. Fluids/Electrolytes/Nutrition:Monitor I/O. Check lytes in am.  8. A fib: Monitor HR bid. Continue Metoprolol bid with Cardizem daily. .  Vitals:    06/27/17 2133 06/28/17 0016  BP: (!) 117/91 119/64  Pulse: 89 77  Resp:  16  Temp:  98.1 F (36.7 C)  SpO2: 99% 99%  Elevated diastolic with narrow pulse pressure, will monitor trend 9. RA: managed with Arava and low dose prednisone.  10. GERD: On Protonix.  11.Hgb A1C- 6.9: T2DM: Monitor BS ac/hs. Resume Amaryl as po intake is good--continue to hold metformin for now.  CBG (last 3)  Recent Labs    06/27/17 1724 06/27/17 2050 06/28/17 0630  GLUCAP 185* 210* 160*  Adequate control 3/27 12.  Hypoalb- add prostat 13.  Right knee effusion noted since Xray 2/28, change diclofenac gel to QID, consider injection of corticosteroid, no oral NSAIDs due to CVA and Xarelto  LOS (Days) 9 A FACE TO FACE EVALUATION WAS PERFORMED  ACharlett Blake3/27/2019, 8:34 AM

## 2017-06-28 NOTE — Progress Notes (Signed)
Social Work   Sorah Falkenstein, Eliezer Champagne  Social Worker  Physical Medicine and Rehabilitation  Patient Care Conference  Signed  Date of Service:  06/28/2017 12:59 PM          Signed          [] Hide copied text  [] Hover for details   Inpatient RehabilitationTeam Conference and Plan of Care Update Date: 06/28/2017   Time: 10:45 AM      Patient Name: [name]      Medical Record Number: [id number]  Date of Birth: [birth year] Sex: Female         Room/Bed: 4W19C/4W19C-01 Payor Info: Payor: MEDICARE / Plan: MEDICARE PART A AND B / Product Type: *No Product type* /     Admitting Diagnosis: CVA  Admit Date/Time:  06/19/2017  6:20 PM Admission Comments: No comment available    Primary Diagnosis:  <principal problem not specified> Principal Problem: <principal problem not specified>       Patient Active Problem List    Diagnosis Date Noted  . Stroke, acute, embolic ([place]) 15/40/0867  . Mitral regurgitation 06/17/2017  . Acute CVA (cerebrovascular accident) ([place])    . Thyroid nodule 06/15/2017  . Atrial fibrillation with RVR ([place]) 06/14/2017  . CVA (cerebral vascular accident) ([place]) 06/14/2017  . Type II diabetes mellitus ([place])    . Peripheral vascular disease ([place])    . Hyperlipidemia    . Hiatal hernia    . Edema    . Chronic diastolic (congestive) heart failure ([place])    . Carpal tunnel syndrome    . Atrial fibrillation ([place])    . Anemia    . Actinic keratosis    . Intractable nausea and vomiting 05/31/2016  . Nausea & vomiting 05/30/2016  . Abnormal chest x-ray 05/30/2016  . Diabetes mellitus with complication ([place])    . Influenza A 05/21/2016  . Chronic atrial fibrillation ([place]) 05/20/2016  . Acute CHF (congestive heart failure) ([place]) 05/20/2016  . History of GI bleed 03/29/2016  . Chronic anticoagulation 03/29/2016  . Symptomatic anemia 03/19/2016  . Spinal stenosis of lumbar region 02/10/2016  . Varicose veins of left leg with edema 09/15/2015  . Preop  cardiovascular exam 08/25/2015  . Iron deficiency anemia due to chronic blood loss 07/17/2015  . Anemia of chronic renal failure, stage 3 (moderate) ([place]) 07/17/2015  . Anticoagulant causing adverse effect in therapeutic use 07/17/2015  . Symptomatic bradycardia 02/21/2015  . Bradycardia    . Acute deep vein thrombosis (DVT) of left lower extremity ([place]) 05/06/2014  . Abdominal wall mass of right lower quadrant 07/08/2013  . OA (osteoarthritis) of hip 04/03/2013  . Osteoarthritis of hip 01/18/2013  . Night sweats 09/06/2012  . Chest pain    . Anxiety and depression    . Gastroesophageal reflux disease    . Hepatitis    . Jaundice    . Hypertension 07/01/2010  . Hypercholesterolemia 07/01/2010  . DM2 (diabetes mellitus, type 2) ([place]) 07/01/2010  . Rheumatoid arthritis ([place]) 07/01/2010  . Sinus tachycardia 07/01/2010  . Chest pain, atypical 07/01/2010      Expected Discharge Date: Expected Discharge Date: 07/07/17   Team Members Present: Physician leading conference: Dr. Alysia Penna Social Worker Present: Ovidio Kin, LCSW Nurse Present: Dorthula Nettles, RN PT Present: Barrie Folk, PT OT Present: Napoleon Form, OT SLP Present: Stormy Fabian, SLP PPS Coordinator present : Daiva Nakayama, RN, CRRN       Current Status/Progress Goal Weekly Team Focus  Medical  levft neglect , fluctuating cognition,  reduce fall risk  Cont rehab   Bowel/Bladder     continent iof bowel & bladder, LBM 06/27/17  remain continent  continue to monitor & assist as needed   Swallow/Nutrition/ Hydration               ADL's     Assist level varies within session and throughout day from CGA- max A for sit<> stand and functional transfers; min A LB bathing/dressing with mod cuing for clothing orientation, VCs for UB dressing for clothing orientation; mod A dynamic standing balance for pt to complete 3/3 toileting tasks.   Supervision overall, may need to downgrade pending pt progress  Functional  transfers, Functional standing balance/endurance, sustained attention, ADL re-training   Mobility     ambulates 12 ft with RW & min/mod assist, difficulty weight bearing through RLE, min<>max for sit>stand, max stand>sit, L inattention, impaired cognition that fluctuates, has not attempted stair negotiation 2/2 fear  min assist overall  gait, transfers, balance, activity & standing tolerance, L attention, cognitive remediation   Communication               Safety/Cognition/ Behavioral Observations   Mod A to complete basic task d/t vastly fluctuating confusion, decreased attention  Mod A - downgraded 3/26  sustained attention, simple problem solving, left inattention   Pain     pain in right leg & knee, has tylenol prn, scheduled voltaren gel & scheduled neurontin qhs & baclofen 5mg  TID  pain scale <3  assess & treat as needed   Skin     no signs of skin bereak down  no new areas of skin break down  assess Q shift     *See Care Plan and progress notes for long and short-term goals.      Barriers to Discharge   Current Status/Progress Possible Resolutions Date Resolved   Physician     Medical stability     Progressing but inconsistent  Caregiver ed      Nursing                 PT  Behavior;Lack of/limited family support  impaired cognition, fear of falling, impaired safety awareness, L inattention              OT                 SLP            SW              Discharge Planning/Teaching Needs:  Home to son's home where they can provide 24 hr care. Will have them come in next week to prepare for discharge.      Team Discussion:  Making progress in therapies and cognitively better. Still inconsistent with her levels and requires cues. R-knee pain limits her in therapies. Working on endurance and balance issues. Will need 24 hr physical at discharge from rehab. Will try to get family in for education next week prior to discharge.  Revisions to Treatment Plan:  DC 4/5      Continued Need for Acute Rehabilitation Level of Care: The patient requires daily medical management by a physician with specialized training in physical medicine and rehabilitation for the following conditions: Daily direction of a multidisciplinary physical rehabilitation program to ensure safe treatment while eliciting the highest outcome that is of practical value to the patient.: Yes Daily medical management of patient stability for increased activity during participation  in an intensive rehabilitation regime.: Yes Daily analysis of laboratory values and/or radiology reports with any subsequent need for medication adjustment of medical intervention for : Neurological problems;Cardiac problems   Lyric Rossano, Gardiner Rhyme 06/28/2017, 12:59 PM                  Tate Zagal, Gardiner Rhyme, LCSW  Social Worker  Physical Medicine and Rehabilitation  Patient Care Conference  Signed  Date of Service:  06/21/2017 11:41 AM          Signed          [] Hide copied text  [] Hover for details   Inpatient RehabilitationTeam Conference and Plan of Care Update Date: 06/21/2017   Time: 10:40 AM      Patient Name: [name]      Medical Record Number: [id number]  Date of Birth: 1945/10/31 Sex: Female         Room/Bed: 4W19C/4W19C-01 Payor Info: Payor: MEDICARE / Plan: MEDICARE PART A AND B / Product Type: *No Product type* /     Admitting Diagnosis: CVA  Admit Date/Time:  06/19/2017  6:20 PM Admission Comments: No comment available    Primary Diagnosis:  <principal problem not specified> Principal Problem: <principal problem not specified>       Patient Active Problem List    Diagnosis Date Noted  . Stroke, acute, embolic ([place]) 40/34/7425  . Mitral regurgitation 06/17/2017  . Acute CVA (cerebrovascular accident) ([place])    . Thyroid nodule 06/15/2017  . Atrial fibrillation with RVR ([place]) 06/14/2017  . CVA (cerebral vascular accident) ([place]) 06/14/2017  . Type II diabetes mellitus ([place])     . Peripheral vascular disease ([place])    . Hyperlipidemia    . Hiatal hernia    . Edema    . Chronic diastolic (congestive) heart failure ([place])    . Carpal tunnel syndrome    . Atrial fibrillation ([place])    . Anemia    . Actinic keratosis    . Intractable nausea and vomiting 05/31/2016  . Nausea & vomiting 05/30/2016  . Abnormal chest x-ray 05/30/2016  . Diabetes mellitus with complication ([place])    . Influenza A 05/21/2016  . Chronic atrial fibrillation ([place]) 05/20/2016  . Acute CHF (congestive heart failure) ([place]) 05/20/2016  . History of GI bleed 03/29/2016  . Chronic anticoagulation 03/29/2016  . Symptomatic anemia 03/19/2016  . Spinal stenosis of lumbar region 02/10/2016  . Varicose veins of left leg with edema 09/15/2015  . Preop cardiovascular exam 08/25/2015  . Iron deficiency anemia due to chronic blood loss 07/17/2015  . Anemia of chronic renal failure, stage 3 (moderate) ([place]) 07/17/2015  . Anticoagulant causing adverse effect in therapeutic use 07/17/2015  . Symptomatic bradycardia 02/21/2015  . Bradycardia    . Acute deep vein thrombosis (DVT) of left lower extremity ([place]) 05/06/2014  . Abdominal wall mass of right lower quadrant 07/08/2013  . OA (osteoarthritis) of hip 04/03/2013  . Osteoarthritis of hip 01/18/2013  . Night sweats 09/06/2012  . Chest pain    . Anxiety and depression    . Gastroesophageal reflux disease    . Hepatitis    . Jaundice    . Hypertension 07/01/2010  . Hypercholesterolemia 07/01/2010  . DM2 (diabetes mellitus, type 2) ([place]) 07/01/2010  . Rheumatoid arthritis ([place]) 07/01/2010  . Sinus tachycardia 07/01/2010  . Chest pain, atypical 07/01/2010      Expected Discharge Date: Expected Discharge Date: 07/07/17   Team Members Present: Physician leading conference: Dr. Mitzi Hansen  [place] Worker Present: Ovidio Kin, LCSW Nurse Present: Arelia Sneddon, RN PT Present: Barrie Folk, PT OT Present: Napoleon Form, OT SLP Present: Stormy Fabian, SLP PPS Coordinator present : Daiva Nakayama, RN, CRRN       Current Status/Progress Goal Weekly Team Focus  Medical     severe left neglect, severe cognitive deficts, poor blaance , Afib, mild anemia, hypoalb  maintain medical stability reduce fall and readmission risk  initiate theapy program   Bowel/Bladder     Continent of B/B  Maintain continence  Timed Toileting   Swallow/Nutrition/ Hydration               ADL's     Mod A stand pivot transfers; Mod A UB/LB bathing/dressing; min A grooming; max cuing to attend to L visual field; max cuing for sustained attention  Supervision overall  Functional transfers, neuro re-ed, sustained attention. ADL re-training   Mobility     max<>+2 total assist for transfers, ambulates 12 ft with 3 muskateers & w/c follow, very poor attention to task (<5 seconds), only consistently AxO to self, very fearful of falling  min assist overall  safety awareness, cognitive remediation, NMR, transfers, balance, gait, stair initiation   Communication               Safety/Cognition/ Behavioral Observations             Pain     Nonverbal evidence of pain (grimacing, restless, irritable)  Pain < 3  Assess qshift and prn   Skin     Red spots to buttocks  prevent breakdown and infection  Assess qshift and prn     *See Care Plan and progress notes for long and short-term goals.      Barriers to Discharge   Current Status/Progress Possible Resolutions Date Resolved   Physician     Medical stability;Behavior  severe left neglect  progressing toward goals  cont rehab      Nursing                 PT  Behavior  significant congnitive deficits, unsure of caregiver support upon d/c.              OT Decreased caregiver support  Family working to piece together 24 hr care             SLP            SW Decreased caregiver support [name] has health issues of his own             Discharge Planning/Teaching Needs:  Home with boyfriend who can provide  supervision levle, son coordinating her care, aware will need someone with her at discharge.      Team Discussion:  Goals min-mod level due to poor attention, constant cueing, lethargic. Left visual field cut and other visual issues. Fear of falling limits her in therapies. Knee  gives out for no reason-MD aware. MD feels pt will make limited progress here and may need short term NHP  Revisions to Treatment Plan:  DC 4/5 versus NHP    Continued Need for Acute Rehabilitation Level of Care: The patient requires daily medical management by a physician with specialized training in physical medicine and rehabilitation for the following conditions: Daily direction of a multidisciplinary physical rehabilitation program to ensure safe treatment while eliciting the highest outcome that is of practical value to the patient.: Yes Daily medical management of patient stability for increased activity during participation  in an intensive rehabilitation regime.: Yes Daily analysis of laboratory values and/or radiology reports with any subsequent need for medication adjustment of medical intervention for : Neurological problems;Blood pressure problems   Elease Hashimoto 06/21/2017, 11:41 AM                 Patient ID: Ali Lowe, female   DOB: 01-05-1946, 72 y.o.   MRN: [id number]

## 2017-06-28 NOTE — Progress Notes (Signed)
Patient did not sleep for most of the night. She c/o pain to her right knee & leg & was given her prn tylenol.She also has voltaren gel that was applied. During application, patient stated  She stated that her pain felt like a sharp, buring pain & that it extended down her leg. After giving her her dose, she did not c/o pain but was noted in her room talking to herself by staff. No acute distress noted. Will continue to monitor & pass on to oncoming nurse

## 2017-06-28 NOTE — Progress Notes (Signed)
Occupational Therapy Weekly Progress Note  Patient Details  Name: [name] MRN: [id number] Date of Birth: [birth year]  Beginning of progress report period: June 20, 2017 End of progress report period: June 28, 2017   Patient has met 3 of 4 short term goals.  Pt making steady progress towards OT goals. Pt's cognition orientation improving daily, able to maintain attention to functional task with overall min cuing.  She is very inconsistent with assist level throughout the day and even throughout session, especially during PM sessions. She can require CGA-max A depending on pt's attention, fatigue, and anxiety level due to pt's extreme fear of falling.  She cont with L side inattention, however, demonstrates improved compensatory stratiegies for locating and attending to items in L visual field. Due to visual and perceptual deficits, pt requiring mod cuing for LB clothing orientation, however, able to don clothing without physical assist.  Pt will cont to benefit from skilled services in order to decrease caregiver burden and increase pt's independence and safety with basic ADLS. Pt plans to d/c to son's home with 24 hr physical and cognitive assist available. Family will need to come in for hands on family training prior to pt's d/c in order to provide needed assist.   Patient continues to demonstrate the following deficits: apraxia, ataxia, cognitive deficits, disturbance of vision, hemiplegia affecting non-dominant side and muscle weakness (generalized) and therefore will continue to benefit from skilled OT intervention to enhance overall performance with BADL and Reduce care partner burden.  Patient not progressing toward long term goals.  See goal revision..  Plan of care revisions: Goals downgraded to overall min A due to pt progress and inconsistency.  OT Short Term Goals Week 1:  OT Short Term Goal 1 (Week 1): Pt will complete toilet transfer with min A using LRAD OT Short Term Goal  1 - Progress (Week 1): Progressing toward goal OT Short Term Goal 2 (Week 1): Pt will correctly orient shirt mod I OT Short Term Goal 2 - Progress (Week 1): Met OT Short Term Goal 3 (Week 1): Pt will stand to complete 2 grooming tasks in order to increase functional activity tolerance OT Short Term Goal 3 - Progress (Week 1): Met OT Short Term Goal 4 (Week 1): Pt will locate grooming item on L sink ledge with min quesitoning cues OT Short Term Goal 4 - Progress (Week 1): Met Week 2:  OT Short Term Goal 1 (Week 2): STG=LTG due to LOS    Therapy Documentation Precautions:  Precautions Precautions: Fall Precaution Comments: L inattention, L hemi, very fearful of falling Restrictions Weight Bearing Restrictions: No  See Function Navigator for Current Functional Status.   Therapy/Group: Individual Therapy  Onnie Alatorre L 06/28/2017, 11:50 AM

## 2017-06-29 ENCOUNTER — Inpatient Hospital Stay (HOSPITAL_COMMUNITY): Payer: Medicare Other | Admitting: Physical Therapy

## 2017-06-29 ENCOUNTER — Inpatient Hospital Stay (HOSPITAL_COMMUNITY): Payer: Medicare Other | Admitting: Speech Pathology

## 2017-06-29 ENCOUNTER — Inpatient Hospital Stay (HOSPITAL_COMMUNITY): Payer: Medicare Other | Admitting: Occupational Therapy

## 2017-06-29 LAB — GLUCOSE, CAPILLARY
GLUCOSE-CAPILLARY: 219 mg/dL — AB (ref 65–99)
Glucose-Capillary: 98 mg/dL (ref 65–99)
Glucose-Capillary: 228 mg/dL — ABNORMAL HIGH (ref 65–99)
Glucose-Capillary: 121 mg/dL — ABNORMAL HIGH (ref 65–99)

## 2017-06-29 NOTE — Progress Notes (Signed)
Subjective/Complaints:  Pt recalls her medication [name] is due today.  We discussed infection risk as well as increased risk in hospital environment, pt agrees to wait until d/c from hospital  ROS:  Denies CP,SOB, N/V/D, poor awareness of deficits   Objective: Vital Signs: Blood pressure (!) 146/70, pulse 71, temperature 98 F (36.7 C), temperature source Oral, resp. rate 17, height '5\' 7"'  (1.702 m), weight 77.5 kg (170 lb 13.7 oz), SpO2 96 %. No results found. Results for orders placed or performed during the hospital encounter of 06/19/17 (from the past 72 hour(s))  Glucose, capillary     Status: Abnormal   Collection Time: 06/26/17 12:03 PM  Result Value Ref Range   Glucose-Capillary 201 (H) 65 - 99 mg/dL  Glucose, capillary     Status: Abnormal   Collection Time: 06/26/17  4:50 PM  Result Value Ref Range   Glucose-Capillary 140 (H) 65 - 99 mg/dL  Basic metabolic panel     Status: Abnormal   Collection Time: 06/26/17  6:26 PM  Result Value Ref Range   Sodium 142 135 - 145 mmol/L   Potassium 4.0 3.5 - 5.1 mmol/L   Chloride 107 101 - 111 mmol/L   CO2 24 22 - 32 mmol/L   Glucose, Bld 148 (H) 65 - 99 mg/dL   BUN 16 6 - 20 mg/dL   Creatinine, Ser 0.94 0.44 - 1.00 mg/dL   Calcium 9.5 8.9 - 10.3 mg/dL   GFR calc non Af Amer 59 (L) >60 mL/min   GFR calc Af Amer >60 >60 mL/min    Comment: (NOTE) The eGFR has been calculated using the CKD EPI equation. This calculation has not been validated in all clinical situations. eGFR's persistently <60 mL/min signify possible Chronic Kidney Disease.    Anion gap 11 5 - 15    Comment: Performed at [facility] [address], [place],  [address]  CBC     Status: Abnormal   Collection Time: 06/26/17  6:26 PM  Result Value Ref Range   WBC 7.8 4.0 - 10.5 K/uL   RBC 5.04 3.87 - 5.11 MIL/uL   Hemoglobin 11.9 (L) 12.0 - 15.0 g/dL   HCT 40.4 36.0 - 46.0 %   MCV 80.2 78.0 - 100.0 fL   MCH 23.6 (L) 26.0 - 34.0 pg   MCHC 29.5  (L) 30.0 - 36.0 g/dL   RDW 18.3 (H) 11.5 - 15.5 %   Platelets 290 150 - 400 K/uL    Comment: Performed at [facility] [address] [place], [place] [address]  Glucose, capillary     Status: Abnormal   Collection Time: 06/26/17  9:15 PM  Result Value Ref Range   Glucose-Capillary 177 (H) 65 - 99 mg/dL  Glucose, capillary     Status: Abnormal   Collection Time: 06/27/17  6:40 AM  Result Value Ref Range   Glucose-Capillary 135 (H) 65 - 99 mg/dL  Glucose, capillary     Status: Abnormal   Collection Time: 06/27/17 12:03 PM  Result Value Ref Range   Glucose-Capillary 210 (H) 65 - 99 mg/dL  Glucose, capillary     Status: Abnormal   Collection Time: 06/27/17  5:24 PM  Result Value Ref Range   Glucose-Capillary 185 (H) 65 - 99 mg/dL  Glucose, capillary     Status: Abnormal   Collection Time: 06/27/17  8:50 PM  Result Value Ref Range   Glucose-Capillary 210 (H) 65 - 99 mg/dL  Glucose,  capillary     Status: Abnormal   Collection Time: 06/28/17  6:30 AM  Result Value Ref Range   Glucose-Capillary 160 (H) 65 - 99 mg/dL  Glucose, capillary     Status: Abnormal   Collection Time: 06/28/17 11:32 AM  Result Value Ref Range   Glucose-Capillary 133 (H) 65 - 99 mg/dL  Glucose, capillary     Status: Abnormal   Collection Time: 06/28/17  4:32 PM  Result Value Ref Range   Glucose-Capillary 217 (H) 65 - 99 mg/dL  Glucose, capillary     Status: Abnormal   Collection Time: 06/28/17  8:39 PM  Result Value Ref Range   Glucose-Capillary 116 (H) 65 - 99 mg/dL  Glucose, capillary     Status: None   Collection Time: 06/29/17  6:50 AM  Result Value Ref Range   Glucose-Capillary 98 65 - 99 mg/dL     HEENT: normal Cardio: RRR and no murmur Resp: CTA B/L and unlabored GI: BS positive and Non Tender and Non distended Extremity:  No Edema Skin:   Intact Neuro: Lethargic, Normal Sensory, Normal Motor, Inattention and Other left homonymous hemianopsia Musc/Skel:  Other Ulnar drift bilateral  MCPs, Bouchard's deformity Right index finger, RIgh tknee effusion with tenderness Gen nad   Assessment/Plan: 1. Functional deficits secondary to Right PCA/post MCA infarct which require 3+ hours per day of interdisciplinary therapy in a comprehensive inpatient rehab setting. Physiatrist is providing close team supervision and 24 hour management of active medical problems listed below. Physiatrist and rehab team continue to assess barriers to discharge/monitor patient progress toward functional and medical goals. FIM: Function - Bathing Position: Shower Body parts bathed by patient: Right arm, Right lower leg, Left lower leg, Left arm, Chest, Abdomen, Front perineal area, Right upper leg, Left upper leg, Buttocks, Back Body parts bathed by helper: Back Assist Level: Supervision or verbal cues  Function- Upper Body Dressing/Undressing What is the patient wearing?: Pull over shirt/dress Pull over shirt/dress - Perfomed by patient: Thread/unthread right sleeve, Put head through opening, Pull shirt over trunk, Thread/unthread left sleeve Pull over shirt/dress - Perfomed by helper: Thread/unthread left sleeve Assist Level: Supervision or verbal cues Function - Lower Body Dressing/Undressing What is the patient wearing?: Non-skid slipper socks, Underwear, Pants Position: Other (comment)(Wheelchair) Underwear - Performed by patient: Thread/unthread right underwear leg, Thread/unthread left underwear leg, Pull underwear up/down Pants- Performed by patient: Pull pants up/down, Thread/unthread right pants leg, Thread/unthread left pants leg Pants- Performed by helper: Thread/unthread left pants leg Non-skid slipper socks- Performed by patient: Don/doff right sock, Don/doff left sock Socks - Performed by patient: Don/doff right sock, Don/doff left sock Shoes - Performed by patient: Don/doff right shoe, Don/doff left shoe, Fasten right, Fasten left Assist for footwear: Supervision/touching  assist Assist for lower body dressing: Supervision or verbal cues  Function - Toileting Toileting steps completed by patient: Adjust clothing prior to toileting, Performs perineal hygiene, Adjust clothing after toileting Toileting steps completed by helper: Adjust clothing prior to toileting, Performs perineal hygiene, Adjust clothing after toileting Toileting Assistive Devices: Grab bar or rail Assist level: Supervision or verbal cues  Function Midwife transfer assistive device: Grab bar Assist level to toilet: Touching or steadying assistance (Pt > 75%) Assist level from toilet: Touching or steadying assistance (Pt > 75%) Assist level to bedside commode (at bedside): Touching or steadying assistance (Pt > 75%) Assist level from bedside commode (at bedside): Touching or steadying assistance (Pt > 75%)  Function - Chair/bed transfer Chair/bed  transfer activity did not occur: Safety/medical concerns Chair/bed transfer method: Stand pivot Chair/bed transfer assist level: Moderate assist (Pt 50 - 74%/lift or lower) Chair/bed transfer assistive device: Armrests Chair/bed transfer details: Tactile cues for sequencing, Tactile cues for weight shifting, Tactile cues for posture, Tactile cues for initiation, Tactile cues for placement, Verbal cues for technique, Manual facilitation for placement, Manual facilitation for weight shifting, Verbal cues for sequencing  Function - Locomotion: Wheelchair Wheelchair activity did not occur: Safety/medical concerns Max wheelchair distance: 25' Assist Level: Supervision or verbal cues Wheel 50 feet with 2 turns activity did not occur: Safety/medical concerns Wheel 150 feet activity did not occur: Safety/medical concerns Function - Locomotion: Ambulation Assistive device: Walker-rolling Max distance: 15 ft Assist level: Touching or steadying assistance (Pt > 75%) Assist level: Touching or steadying assistance (Pt > 75%) Walk 50 feet  with 2 turns activity did not occur: Safety/medical concerns Walk 150 feet activity did not occur: Safety/medical concerns Walk 10 feet on uneven surfaces activity did not occur: Safety/medical concerns  Function - Comprehension Comprehension: Auditory Comprehension assist level: Understands basic 75 - 89% of the time/ requires cueing 10 - 24% of the time  Function - Expression Expression: Verbal Expression assist level: Expresses basic 75 - 89% of the time/requires cueing 10 - 24% of the time. Needs helper to occlude trach/needs to repeat words.  Function - Social Interaction Social Interaction assist level: Interacts appropriately 75 - 89% of the time - Needs redirection for appropriate language or to initiate interaction.  Function - Problem Solving Problem solving assist level: Solves basic 75 - 89% of the time/requires cueing 10 - 24% of the time  Function - Memory Memory assist level: Recognizes or recalls 50 - 74% of the time/requires cueing 25 - 49% of the time Patient normally able to recall (first 3 days only): That he or she is in a hospital, Current season, Staff names and faces  Medical Problem List and Plan: 1.Functional and visual-spatial deficitssecondary to right PCA/MCA infarct -CIR PT, OT, SLP ,  2. DVT Prophylaxis/Anticoagulation: Contxarelto 24m /day 3.Chronic LBP/Right knee pain/Pain Management:tylenol prn and local measures. Continue to hold Norco and restart gabapentin for Sciatic pain 4. Mood:LCSW to follow for evaluation and support.Continue Wellbutrin bid 5. Neuropsych: This patientis not fullycapable of making decisions onherown behalf. 6. Skin/Wound Care:routine pressure relief measures 7. Fluids/Electrolytes/Nutrition:Monitor I/O. Check lytes in am.  8. A fib: Monitor HR bid. Continue Metoprolol bid with Cardizem daily. .  Vitals:   06/28/17 2125 06/29/17 0057  BP: (!) 146/76 (!) 146/70  Pulse: 72 71  Resp:  17  Temp:   98 F (36.7 C)  SpO2: 96% 96%  Adequate control 3/28 9. RA: managed with Arava and low dose prednisone.  10. GERD: On Protonix.  11.Hgb A1C- 6.9: T2DM: Monitor BS ac/hs. Resume Amaryl as po intake is good--continue to hold metformin for now.  CBG (last 3)  Recent Labs    06/28/17 1632 06/28/17 2039 06/29/17 0650  GLUCAP 217* 116* 98  Adequate control 3/28 12.  Hypoalb- add prostat   LOS (Days) 10 A FACE TO FACE EVALUATION WAS PERFORMED  ACharlett Blake3/28/2019, 8:42 AM

## 2017-06-29 NOTE — Progress Notes (Signed)
Speech Language Pathology Daily Session Note  Patient Details  Name: [name] MRN: [id number] Date of Birth: 26-Jun-1945  Today's Date: 06/29/2017 SLP Individual Time: 0930-1015 SLP Individual Time Calculation (min): 45 min  Short Term Goals: Week 2: SLP Short Term Goal 1 (Week 2): Pt will demonstrate sustained attention to functional tasks for 20 minutes with mod A verbal cues for redirection, SLP Short Term Goal 2 (Week 2): Pt will identify 2 physical and 2 cognitive deficits due to CVA with mod A verbal cues. SLP Short Term Goal 3 (Week 2): Pt will demonstrate functional problem solving for basic and familiar tasks with mod A verbal cues. SLP Short Term Goal 4 (Week 2): Pt will demonstrate auditory comprehension of 1-2 step commands during functional tasks in 75% of opportunties with Mod A verbal/question cues. SLP Short Term Goal 5 (Week 2): Pt will scan to midline in 50% of opportunties with Mod A verbal/visual cues.  SLP Short Term Goal 6 (Week 2): Pt will utilize external memory aids to recall new, daily information with mod A verbal/question cues.   Skilled Therapeutic Interventions: Skilled treatment session focused on cognition goals. SLP received pt in her wheelchair and pt immediately stated that she was mad. Pt states that she has been trying to call her son but can't get the room phone nor her cel phone to work well. SLP facilitated her attempt to call son by turning her cell phone on (it had been off for all attempts). Son answered and pt spoke with her son about discharging to SNF. SLP requested to speak with son and he states that pt is unsafe to go home with boyfriend (true) and that he is available but pt wants to discharge to SNF. SLP informed CSW of SNF chosen and she will follow up. Pt left upright in wheelchair with safety belt donned and speaking to son. Continue per current plan of care.      Function:   Cognition Comprehension Comprehension assist level:  Understands basic 75 - 89% of the time/ requires cueing 10 - 24% of the time;Understands basic 50 - 74% of the time/ requires cueing 25 - 49% of the time  Expression   Expression assist level: Expresses basic 50 - 74% of the time/requires cueing 25 - 49% of the time. Needs to repeat parts of sentences.;Expresses basic 75 - 89% of the time/requires cueing 10 - 24% of the time. Needs helper to occlude trach/needs to repeat words.  Social Interaction Social Interaction assist level: Interacts appropriately 75 - 89% of the time - Needs redirection for appropriate language or to initiate interaction.  Problem Solving Problem solving assist level: Solves basic 50 - 74% of the time/requires cueing 25 - 49% of the time  Memory Memory assist level: Recognizes or recalls 50 - 74% of the time/requires cueing 25 - 49% of the time    Pain    Therapy/Group: Individual Therapy  Saphire Barnhart 06/29/2017, 12:11 PM

## 2017-06-29 NOTE — Progress Notes (Signed)
Occupational Therapy Session Note  Patient Details  Name: [name] MRN: [id number] Date of Birth: 15-May-1945  Today's Date: 06/29/2017 OT Individual Time: 1300-1345 OT Individual Time Calculation (min): 45 min    Short Term Goals: Week 2:  OT Short Term Goal 1 (Week 2): STG=LTG due to LOS  Skilled Therapeutic Interventions/Progress Updates:  Pt presented on EOB with bed alarm going off because she attempted to leave the bed unattended to use the bathroom, CNA was present and handed pt off to OT staff. Pt was reminded of safety risks due out of bed restrictions and educated her on objects being on the floor that could be a fall hazard she acknowledged it was unsafe. Pt was transported to toilet by w/c, sit to stand using using grab bars Min A.  Pt then sat in w/c with breakfast tray, she was able to locate all items on the tray. OJ and Oatmeal was placed on the L side of the tray by therapist and pt was able to find both items w/o cuing.  She participated in grooming session standing at the sink with RW and was able to locate items on L side requiring supervision. During transitioning to next activity she was given safety education on how to safely use the w/c by locking brakes and moving close to objects she wants to pick up and not reaching out of her base of support, she was able to demonstrate this back. Session ended with the pt organizing her drawers seated in w/c to incorporate planning, fine motor functions by sorting through papers, gross motor function in UE and ROM as well as a visual field task.  Pt was left in w/c with safety belt on and call bells and phone within reach.      Therapy Documentation Precautions:  Precautions Precautions: Fall Precaution Comments: L inattention, L hemi, very fearful of falling Restrictions Weight Bearing Restrictions: No   Therapy/Group: Individual Therapy  Vilinda Blanks 06/29/2017, 3:22 PM

## 2017-06-29 NOTE — Progress Notes (Signed)
Occupational Therapy Session Note  Patient Details  Name: [name] MRN: [id number] Date of Birth: 13-Mar-1946  Today's Date: 06/29/2017 OT Individual Time: 1300-1345 OT Individual Time Calculation (min): 45 min    Short Term Goals: Week 2:  OT Short Term Goal 1 (Week 2): STG=LTG due to LOS  Skilled Therapeutic Interventions/Progress Updates:    Pt seen for OT session focusing on orientation, w/c propulsion with attention, visual scanning, and standing tolerance/balance. Pt sitting up in w/c upon arrival, agreeable to tx session and denying pain.  Pt not oriented to time or day of week. Used calandar in room to assist with re-orientation, pt requiring max A to use calendar and correctly recall day, month, and year.  She self propelled w/c ~20 ft in hallway, max cuing for attention to environmental obstacles on L and w/c propulsion techniques.  In BI gym, completed Dyn[employer]ision activity emphasizing L visual scanning and attention to task. Completed x2 trials from w/c level and 2 consecutive trials of standing, tolerating 4 minutes of standing before requiring seated rest break. CGA and one UE support on RW for dynamic standing balance. Pt consistently required 50% more time to locate targets L of midline compared to R of midline.  Completed x3 sit <> stand from w/c to RW, trialing different hand positions on RW and w/c. Pt requiring max A to stand all trials due to posterior lean despite multimodal cuing for anterior weight shift likely due to her fear of falling. Pt returned to room and completed grooming tasks standing at sink, guarding assist for balance. Pt oriented to complete date at end of session independently after being questioned on this multiple times throughout session. Pt left seated in w/c at end of session, QRB and chair alarm on and all needs in reach.   Therapy Documentation Precautions:  Precautions Precautions: Fall Precaution Comments: L inattention, L hemi, very  fearful of falling Restrictions Weight Bearing Restrictions: No Pain:   No/denies pain  See Function Navigator for Current Functional Status.   Therapy/Group: Individual Therapy  Aman Batley L 06/29/2017, 8:01 AM

## 2017-06-29 NOTE — Progress Notes (Signed)
Physical Therapy Session Note  Patient Details  Name: [name] MRN: [id number] Date of Birth: [birth year]  Today's Date: 06/29/2017 PT Individual Time: 5625-6389 PT Individual Time Calculation (min): 63 min   Short Term Goals: Week 2:  PT Short Term Goal 1 (Week 2): Pt will ambulate 50 ft with mod assist +1 & LRAD. PT Short Term Goal 2 (Week 2): Pt will initiate stair training.  PT Short Term Goal 3 (Week 2): Pt will propel w/c with min assist x 50 ft.   Skilled Therapeutic Interventions/Progress Updates:   Pt received in w/c, agreeable to therapy. Pt oriented to date and can recall what she did in earlier therapy sessions this morning w/ help of memory notebook. Pt is aware of discharge plans and reports she is going to [facility] in [place] upon discharge. She states she has come around to idea and knows she needs to get stronger before she can go home. Pt complains of pain in R knee 5/10, rest breaks taken prn. Therapist propelled pt in w/c to gym for energy conservation. Pt requires multiple attempts for sit<>stand w/ mod A and verbal and tactile cuing for sequencing, anterior weight shift and hip extension to come to upright standing. Pt demonstrating posterior lean 2/2 fear of falling during sit<>stand. Once pt able to complete sit<>stand, ambulated w/ RW 45ft, then 12ft with modA and verbal cuing to increase step length, demonstrating mild R knee buckling during gait. Pt required rest break in between trials. Pt agreeable to initiate stair training with encouragement and reports she has 3 steps to enter with bilateral railing. Pt educated on sequencing for stair negotiation. Pt reports stairs cause a lot of anxiety as "2 of my major falls happened on my steps." Pt completed 3'' stepx2 with heavy use of bilateral railings and mod A. Pt demonstrated bilateral knee buckling on first attempt but able to correct with railing and mod A from therapist. Pt reports a "burning sensation" in  R knee and states the Voltaren gel has significantly helped alleviate her pain. Pt attempts stand pivot transfer to mat but unable to complete 2/2 fatigue. Pt propelled w/c back to room w/ BUE for strengthening and endurance. Pt required verbal and tactile cuing for hand placement and technique. Pt veered to L while propelling chair and required verbal cuing to correct. Pt able to verbalize room number and able to navigate to her room with heavy cues. Pt left seated in w/c, quick release belt and chair alarm on, and all needs within reach.   Therapy Documentation Precautions:  Precautions Precautions: Fall Precaution Comments: L inattention, L hemi, very fearful of falling Restrictions Weight Bearing Restrictions: No   See Function Navigator for Current Functional Status.   Therapy/Group: Individual Therapy  Caffie Damme 06/29/2017, 12:46 PM

## 2017-06-29 NOTE — Progress Notes (Signed)
Social Work Patient ID: [name], female   DOB: 09/01/1945, 72 y.o.   MRN: [id number]  Met with pt and spoke with son via telephone to discuss team conference goals and the plan. With [facility] this am she informed worker pt wants to go to [facility] when leaves here. Both son and pt in agreement with this and pt is adamant about this. Will begin the process. Pt's insurance will cover this option.

## 2017-06-30 ENCOUNTER — Inpatient Hospital Stay (HOSPITAL_COMMUNITY): Payer: Medicare Other | Admitting: Physical Therapy

## 2017-06-30 ENCOUNTER — Inpatient Hospital Stay (HOSPITAL_COMMUNITY): Payer: Medicare Other | Admitting: Speech Pathology

## 2017-06-30 ENCOUNTER — Inpatient Hospital Stay (HOSPITAL_COMMUNITY): Payer: Medicare Other | Admitting: Occupational Therapy

## 2017-06-30 LAB — GLUCOSE, CAPILLARY
GLUCOSE-CAPILLARY: 134 mg/dL — AB (ref 65–99)
Glucose-Capillary: 127 mg/dL — ABNORMAL HIGH (ref 65–99)
Glucose-Capillary: 180 mg/dL — ABNORMAL HIGH (ref 65–99)
Glucose-Capillary: 210 mg/dL — ABNORMAL HIGH (ref 65–99)

## 2017-06-30 NOTE — Progress Notes (Signed)
Subjective/Complaints:  No issues overnite except for some nausea.  No abd pain  ROS:  Denies CP,SOB, no vomiting, no diarrhea  Objective: Vital Signs: Blood pressure (!) 140/53, pulse 75, temperature 97.6 F (36.4 C), temperature source Oral, resp. rate 17, height 5\' 7"  (1.702 m), weight 77.5 kg (170 lb 13.7 oz), SpO2 93 %. No results found. Results for orders placed or performed during the hospital encounter of 06/19/17 (from the past 72 hour(s))  Glucose, capillary     Status: Abnormal   Collection Time: 06/27/17 12:03 PM  Result Value Ref Range   Glucose-Capillary 210 (H) 65 - 99 mg/dL  Glucose, capillary     Status: Abnormal   Collection Time: 06/27/17  5:24 PM  Result Value Ref Range   Glucose-Capillary 185 (H) 65 - 99 mg/dL  Glucose, capillary     Status: Abnormal   Collection Time: 06/27/17  8:50 PM  Result Value Ref Range   Glucose-Capillary 210 (H) 65 - 99 mg/dL  Glucose, capillary     Status: Abnormal   Collection Time: 06/28/17  6:30 AM  Result Value Ref Range   Glucose-Capillary 160 (H) 65 - 99 mg/dL  Glucose, capillary     Status: Abnormal   Collection Time: 06/28/17 11:32 AM  Result Value Ref Range   Glucose-Capillary 133 (H) 65 - 99 mg/dL  Glucose, capillary     Status: Abnormal   Collection Time: 06/28/17  4:32 PM  Result Value Ref Range   Glucose-Capillary 217 (H) 65 - 99 mg/dL  Glucose, capillary     Status: Abnormal   Collection Time: 06/28/17  8:39 PM  Result Value Ref Range   Glucose-Capillary 116 (H) 65 - 99 mg/dL  Glucose, capillary     Status: None   Collection Time: 06/29/17  6:50 AM  Result Value Ref Range   Glucose-Capillary 98 65 - 99 mg/dL  Glucose, capillary     Status: Abnormal   Collection Time: 06/29/17 11:31 AM  Result Value Ref Range   Glucose-Capillary 219 (H) 65 - 99 mg/dL  Glucose, capillary     Status: Abnormal   Collection Time: 06/29/17  4:47 PM  Result Value Ref Range   Glucose-Capillary 228 (H) 65 - 99 mg/dL   Comment 1  Notify RN   Glucose, capillary     Status: Abnormal   Collection Time: 06/29/17  9:10 PM  Result Value Ref Range   Glucose-Capillary 121 (H) 65 - 99 mg/dL   Comment 1 Notify RN   Glucose, capillary     Status: Abnormal   Collection Time: 06/30/17  6:54 AM  Result Value Ref Range   Glucose-Capillary 127 (H) 65 - 99 mg/dL   Comment 1 Notify RN      HEENT: normal Cardio: RRR and no murmur Resp: CTA B/L and unlabored GI: BS positive and Non Tender and Non distended Extremity:  No Edema Skin:   Intact Neuro: Lethargic, Normal Sensory, Normal Motor, Inattention and Other left homonymous hemianopsia Musc/Skel:  Other Ulnar drift bilateral MCPs, Bouchard's deformity Right index finger, RIgh tknee effusion with tenderness Gen nad   Assessment/Plan: 1. Functional deficits secondary to Right PCA/post MCA infarct which require 3+ hours per day of interdisciplinary therapy in a comprehensive inpatient rehab setting. Physiatrist is providing close team supervision and 24 hour management of active medical problems listed below. Physiatrist and rehab team continue to assess barriers to discharge/monitor patient progress toward functional and medical goals. FIM: Function - Bathing Position: Research scientist (life sciences) parts  bathed by patient: Right arm, Right lower leg, Left lower leg, Left arm, Chest, Abdomen, Front perineal area, Right upper leg, Left upper leg, Buttocks, Back Body parts bathed by helper: Back Assist Level: Supervision or verbal cues  Function- Upper Body Dressing/Undressing What is the patient wearing?: Pull over shirt/dress Pull over shirt/dress - Perfomed by patient: Thread/unthread right sleeve, Put head through opening, Pull shirt over trunk, Thread/unthread left sleeve Pull over shirt/dress - Perfomed by helper: Thread/unthread left sleeve Assist Level: Supervision or verbal cues Function - Lower Body Dressing/Undressing What is the patient wearing?: Socks, Shoes(pants already  donned) Position: Other (comment)(Wheelchair) Underwear - Performed by patient: Thread/unthread right underwear leg, Thread/unthread left underwear leg, Pull underwear up/down Pants- Performed by patient: Pull pants up/down, Thread/unthread right pants leg, Thread/unthread left pants leg Pants- Performed by helper: Thread/unthread left pants leg Non-skid slipper socks- Performed by patient: Don/doff right sock, Don/doff left sock Socks - Performed by patient: Don/doff right sock, Don/doff left sock Shoes - Performed by patient: Don/doff right shoe, Don/doff left shoe, Fasten right, Fasten left Assist for footwear: Supervision/touching assist Assist for lower body dressing: Supervision or verbal cues  Function - Toileting Toileting steps completed by patient: Adjust clothing prior to toileting, Performs perineal hygiene, Adjust clothing after toileting Toileting steps completed by helper: Adjust clothing prior to toileting, Performs perineal hygiene, Adjust clothing after toileting Toileting Assistive Devices: Grab bar or rail Assist level: Touching or steadying assistance (Pt.75%)  Function - Air cabin crew transfer assistive device: Grab bar Assist level to toilet: Touching or steadying assistance (Pt > 75%) Assist level from toilet: Touching or steadying assistance (Pt > 75%) Assist level to bedside commode (at bedside): Touching or steadying assistance (Pt > 75%) Assist level from bedside commode (at bedside): Touching or steadying assistance (Pt > 75%)  Function - Chair/bed transfer Chair/bed transfer activity did not occur: Safety/medical concerns Chair/bed transfer method: Ambulatory Chair/bed transfer assist level: Touching or steadying assistance (Pt > 75%) Chair/bed transfer assistive device: Armrests, Walker Chair/bed transfer details: Tactile cues for sequencing, Tactile cues for weight shifting, Tactile cues for posture, Tactile cues for initiation, Tactile cues for  placement, Verbal cues for technique, Manual facilitation for placement, Manual facilitation for weight shifting, Verbal cues for sequencing  Function - Locomotion: Wheelchair Wheelchair activity did not occur: Safety/medical concerns Max wheelchair distance: 150 ft Assist Level: Supervision or verbal cues Wheel 50 feet with 2 turns activity did not occur: Safety/medical concerns Assist Level: Supervision or verbal cues Wheel 150 feet activity did not occur: Safety/medical concerns Assist Level: Supervision or verbal cues Function - Locomotion: Ambulation Assistive device: Walker-rolling Max distance: 18 ft Assist level: Touching or steadying assistance (Pt > 75%) Assist level: Touching or steadying assistance (Pt > 75%) Walk 50 feet with 2 turns activity did not occur: Safety/medical concerns Walk 150 feet activity did not occur: Safety/medical concerns Walk 10 feet on uneven surfaces activity did not occur: Safety/medical concerns  Function - Comprehension Comprehension: Auditory Comprehension assist level: Understands basic 75 - 89% of the time/ requires cueing 10 - 24% of the time, Understands basic 50 - 74% of the time/ requires cueing 25 - 49% of the time  Function - Expression Expression: Verbal Expression assist level: Expresses basic 50 - 74% of the time/requires cueing 25 - 49% of the time. Needs to repeat parts of sentences., Expresses basic 75 - 89% of the time/requires cueing 10 - 24% of the time. Needs helper to occlude trach/needs to repeat words.  Function -  Social Interaction Social Interaction assist level: Interacts appropriately 75 - 89% of the time - Needs redirection for appropriate language or to initiate interaction.  Function - Problem Solving Problem solving assist level: Solves basic 50 - 74% of the time/requires cueing 25 - 49% of the time  Function - Memory Memory assist level: Recognizes or recalls 50 - 74% of the time/requires cueing 25 - 49% of the  time Patient normally able to recall (first 3 days only): That he or she is in a hospital, Current season, Staff names and faces  Medical Problem List and Plan: 1.Functional and visual-spatial deficitssecondary to right PCA/MCA infarct -CIR PT, OT, SLP ,pt would like to go SNF post d/c  2. DVT Prophylaxis/Anticoagulation: Contxarelto 20mg  /day 3.Chronic LBP/Right knee pain/Pain Management:tylenol prn and local measures. Continue to hold Norco and restart gabapentin for Sciatic pain 4. Mood:LCSW to follow for evaluation and support.Continue Wellbutrin bid 5. Neuropsych: This patientis not fullycapable of making decisions onherown behalf. 6. Skin/Wound Care:routine pressure relief measures 7. Fluids/Electrolytes/Nutrition:Monitor I/O. Check lytes in am.  8. A fib: Monitor HR bid. Continue Metoprolol bid with Cardizem daily. .  Vitals:   06/29/17 2128 06/30/17 0500  BP: (!) 147/73 (!) 140/53  Pulse: (!) 102 75  Resp:  17  Temp:  97.6 F (36.4 C)  SpO2: 97% 93%  Adequate control 3/29 9. RA: managed with Arava and low dose prednisone.  10. GERD: On Protonix.  11.Hgb A1C- 6.9: T2DM: Monitor BS ac/hs. Resume Amaryl as po intake is good--continue to hold metformin for now.  CBG (last 3)  Recent Labs    06/29/17 1647 06/29/17 2110 06/30/17 0654  GLUCAP 228* 121* 127*  Adequate control 3/29 12.  Hypoalb- add prostat   LOS (Days) 11 A FACE TO FACE EVALUATION WAS PERFORMED  Charlett Blake 06/30/2017, 7:30 AM

## 2017-06-30 NOTE — Progress Notes (Signed)
Occupational Therapy Session Note  Patient Details  Name: [name] MRN: [id number] Date of Birth: [birth year]  Today's Date: 06/30/2017 OT Individual Time: 8127-5170 OT Individual Time Calculation (min): 75 min    Short Term Goals: Week 2:  OT Short Term Goal 1 (Week 2): STG=LTG due to LOS  Skilled Therapeutic Interventions/Progress Updates:  Pt presented in w/c with no c/o pain, she agreed to ADL bathing session. Pt amb in room with RW to gather clean clothing. Pt shower transfer Min A using RW and grab bars to shower bench, bathed at shower level. Pt's toilet transfers using RW and grab bars Mod A for sit >stand due to low toilet seat. Pt dressed LB Min VCs for shoe donning. Pt was also educated on energy conservation techniques as well as safety precautions by completley dressing LB so to only stand and pull up clothing once as well as taking her time during dressing due to her impusivity. Pt completed dressing UB including a button up shirt with Min VCs. She then self propelled in w/c to improve UB strength,and w/c management, she had difficulty at times with L side due to proprioception during w/c management and needed Min VCs to find L arm. She filled in her session in her memory book requiring Min VCs for date, then self propelled back to the room. She was left in w/c with chair alarm and safety belt on, as well as phone next to her.      Therapy Documentation Precautions:  Precautions Precautions: Fall Precaution Comments: L inattention, L hemi, very fearful of falling Restrictions Weight Bearing Restrictions: No    Therapy/Group: Individual Therapy  Vilinda Blanks 06/30/2017, 12:11 PM

## 2017-06-30 NOTE — Plan of Care (Signed)
  Problem: Consults Goal: RH STROKE PATIENT EDUCATION Description See Patient Education module for education specifics  Outcome: Progressing Goal: Diabetes Guidelines if Diabetic/Glucose > 140 Description If diabetic or lab glucose is > 140 mg/dl - Initiate Diabetes/Hyperglycemia Guidelines & Document Interventions  Outcome: Progressing   Problem: RH BOWEL ELIMINATION Goal: RH STG MANAGE BOWEL WITH ASSISTANCE Description STG Manage Bowel with min Assistance.  Outcome: Progressing Flowsheets (Taken 06/30/2017 1214) STG: Pt will manage bowels with assistance: 6-Modified independent Goal: RH STG MANAGE BOWEL W/MEDICATION W/ASSISTANCE Description STG Manage Bowel with Medication with min Assistance.  Outcome: Progressing Flowsheets (Taken 06/30/2017 1214) STG: Pt will manage bowels with medication with assistance: 6-Modified independent Goal: RH STG MANAGE BOWEL W/EQUIPMENT W/ASSISTANCE Description STG Manage Bowel With Equipment With min Assistance  Outcome: Progressing Flowsheets (Taken 06/30/2017 1214) STG: Pt will manage bowels with equipment with assistance: 6-Modified independent   Problem: RH BLADDER ELIMINATION Goal: RH STG MANAGE BLADDER WITH ASSISTANCE Description STG Manage Bladder With min Assistance  Outcome: Progressing Flowsheets (Taken 06/30/2017 1214) STG: Pt will manage bladder with assistance: 6-Modified independent   Problem: RH SKIN INTEGRITY Goal: RH STG SKIN FREE OF INFECTION/BREAKDOWN Description With min assistance  Outcome: Progressing Goal: RH STG MAINTAIN SKIN INTEGRITY WITH ASSISTANCE Description STG Maintain Skin Integrity With min Assistance.  Outcome: Progressing Flowsheets (Taken 06/30/2017 1214) STG: Maintain skin integrity with assistance: 5-Supervision/cueing   Problem: RH SAFETY Goal: RH STG ADHERE TO SAFETY PRECAUTIONS W/ASSISTANCE/DEVICE Description STG Adhere to Safety Precautions With min Assistance/Device.  Outcome:  Progressing Flowsheets (Taken 06/30/2017 1214) STG:Pt will adhere to safety precautions with assistance/device: 5-Supervision/set up Goal: RH STG DECREASED RISK OF FALL WITH ASSISTANCE Description STG Decreased Risk of Fall With min Assistance.  Outcome: Progressing Flowsheets (Taken 06/30/2017 1214) [id number] risk of fall  with assistance/device: 6-Modified independent   Problem: RH COGNITION-NURSING Goal: RH STG USES MEMORY AIDS/STRATEGIES W/ASSIST TO PROBLEM SOLVE Description STG Uses Memory Aids/Strategies With Assistance to Problem Solve. Outcome: Progressing Goal: RH STG ANTICIPATES NEEDS/CALLS FOR ASSIST W/ASSIST/CUES Description STG Anticipates Needs/Calls for Assist With Assistance/Cues. Outcome: Progressing Flowsheets (Taken 06/30/2017 1214) STG: Anticipates needs/calls for assistance with assistance/cues: 6-Modified independent   Problem: RH PAIN MANAGEMENT Goal: RH STG PAIN MANAGED AT OR BELOW PT'S PAIN GOAL Description <3  Outcome: Progressing   Problem: RH KNOWLEDGE DEFICIT Goal: RH STG INCREASE KNOWLEDGE OF DIABETES Outcome: Progressing Goal: RH STG INCREASE KNOWLEDGE OF HYPERTENSION Outcome: Progressing

## 2017-06-30 NOTE — Progress Notes (Signed)
Speech Language Pathology Daily Session Note  Patient Details  Name: [name] MRN: [id number] Date of Birth: September 08, 1945  Today's Date: 06/30/2017 SLP Individual Time: 1100-1200 SLP Individual Time Calculation (min): 60 min  Short Term Goals: Week 2: SLP Short Term Goal 1 (Week 2): Pt will demonstrate sustained attention to functional tasks for 20 minutes with mod A verbal cues for redirection, SLP Short Term Goal 2 (Week 2): Pt will identify 2 physical and 2 cognitive deficits due to CVA with mod A verbal cues. SLP Short Term Goal 3 (Week 2): Pt will demonstrate functional problem solving for basic and familiar tasks with mod A verbal cues. SLP Short Term Goal 4 (Week 2): Pt will demonstrate auditory comprehension of 1-2 step commands during functional tasks in 75% of opportunties with Mod A verbal/question cues. SLP Short Term Goal 5 (Week 2): Pt will scan to midline in 50% of opportunties with Mod A verbal/visual cues.  SLP Short Term Goal 6 (Week 2): Pt will utilize external memory aids to recall new, daily information with mod A verbal/question cues.   Skilled Therapeutic Interventions: Skilled treatment session focused on cognition goals. SLP facilitated session by providing Min A cues to demonstrate sustained attention to task for 30 minutes. Pt required Mod A to Max A to deal cards into alternating piles, Max A to scan to left of midline and to perform simple card task with Mod A cues. Pt was returned to room, left upright in wheelchair chair alarm on and all needs within reach. Continue per current plan of care.      Function:  Eating Eating   Modified Consistency Diet: No Eating Assist Level: Supervision or verbal cues   Eating Set Up Assist For: Opening containers;Cutting food       Cognition Comprehension Comprehension assist level: Understands basic 75 - 89% of the time/ requires cueing 10 - 24% of the time;Understands basic 50 - 74% of the time/ requires cueing 25 -  49% of the time  Expression   Expression assist level: Expresses basic 75 - 89% of the time/requires cueing 10 - 24% of the time. Needs helper to occlude trach/needs to repeat words.  Social Interaction Social Interaction assist level: Interacts appropriately 75 - 89% of the time - Needs redirection for appropriate language or to initiate interaction.  Problem Solving Problem solving assist level: Solves basic 50 - 74% of the time/requires cueing 25 - 49% of the time  Memory Memory assist level: Recognizes or recalls 50 - 74% of the time/requires cueing 25 - 49% of the time    Pain Pain Assessment Faces Pain Scale: No hurt  Therapy/Group: Individual Therapy  [name] 06/30/2017, 12:47 PM

## 2017-06-30 NOTE — Progress Notes (Signed)
Physical Therapy Session Note  Patient Details  Name: [name] MRN: [id number] Date of Birth: [birth year]  Today's Date: 06/30/2017 PT Individual Time: 0802-0901 and 3614-4315 PT Individual Time Calculation (min): 59 min and 26 min  Short Term Goals: Week 2:  PT Short Term Goal 1 (Week 2): Pt will ambulate 50 ft with mod assist +1 & LRAD. PT Short Term Goal 2 (Week 2): Pt will initiate stair training.  PT Short Term Goal 3 (Week 2): Pt will propel w/c with min assist x 50 ft.   Skilled Therapeutic Interventions/Progress Updates:     Session 1: Pt received sitting edge of bed, nursing present, and beginning to eat breakfast. Patient oriented to date. LPN states pt did not feel well this morning 2/2 stomach discomfort, but after having a bowel movement, she felt much better and was able to eat breakfast. During breakfast, focused on L inattention and LUE use by using L hand to manipulate silverware and placing items on L side of tray. Pt required verbal cuing to maintain use of LUE and to find items on tray. Pt donned shoes with increased time and min A to manipulate shoe strings. Therapist propelled pt to gym in w/c for time management. Pt required multiple attempts for sit<>stand modA, but was unsuccessful due to posterior lean and fear of falling. Pt educated on sit<>stand technique and sequencing with therapist providing verbal cuing and demonstration. Pt able to complete minA sit<>stand with verbal cuing for technique. Pt ambulated w/ RW 26 ft with mod A, demonstrating forward lean on RW and decreased bilateral step length, requiring verbal cues to increase step length and maintain upright posture. Pt engaged in cognitive task of naming animal figures in sitting while focusing on L inattention. Pt named the animals correctly on initial trial 66% of time, otherwise required max cuing and increased time to answer correctly. Pt ambulated w/ RW in same manner 21ft. Therapist propelled pt in w/c  back to room. At end of session, pt left seated in w/c, quick release belt and chair alarm on and recording in her memory notebook.   Session 2: Pt received in w/c, agreeable to therapy. Therapist propelled pt in w/c to [facility] gym to perform [employer], focusing on anterior weight shifting, dynamic standing balance, and L inattention. Pt performs sit<>stand w/ RW and minA, requiring verbal cuing for sequencing. Pt able to maintain standing >2 minutes to perform dynavision, intermittently using RW for balance support despite therapist cuing her not to. Pt scored average of 6 seconds for reaction time on trial #1, with the left lower quadrant having the longest reaction time (7.43s). On trial #2, patient positioned farther back to increase balance challenge and facilitate larger anterior weight shift. Pt scored average of 3.16 seconds for reaction time, with Left lower quadrant being the most difficult. Pt was most successful on both trials when lights were in right upper quadrant. Throughout activity, patient required verbal and auditory cuing to locate red light. Therapist propelled pt back to room and was left seated in w/c with quick release and chair alarm on.    Therapy Documentation Precautions:  Precautions Precautions: Fall Precaution Comments: L inattention, L hemi, very fearful of falling Restrictions Weight Bearing Restrictions: No   See Function Navigator for Current Functional Status.   Therapy/Group: Individual Therapy  Caffie Damme 06/30/2017, 9:19 AM

## 2017-06-30 NOTE — [place] FL2 (Signed)
[place] MEDICAID FL2 LEVEL OF CARE SCREENING TOOL     IDENTIFICATION  Patient Name: [name] Birthdate: Apr 29, 1945 Sex: female Admission Date (Current Location): 06/19/2017  [facility] and [place] Number:  Herbalist and Address:  [facility] [place], [place] [name] [place], [place] [address]      Provider Number:    Attending Physician Name and Address:  Charlett Blake, MD  Relative Name and Phone Number:  Lilyan Punt [phone]    Current Level of Care: Other (Comment)(rehab) Recommended Level of Care: [place] Prior Approval Number:    Date Approved/Denied:   PASRR Number: [id number]  Discharge Plan: SNF    Current Diagnoses: Patient Active Problem List   Diagnosis Date Noted  . Stroke, acute, embolic ([place]) 32/67/1245  . Mitral regurgitation 06/17/2017  . Acute CVA (cerebrovascular accident) ([place])   . Thyroid nodule 06/15/2017  . Atrial fibrillation with RVR ([place]) 06/14/2017  . CVA (cerebral vascular accident) ([place]) 06/14/2017  . Type II diabetes mellitus ([place])   . Peripheral vascular disease ([place])   . Hyperlipidemia   . Hiatal hernia   . Edema   . Chronic diastolic (congestive) heart failure ([place])   . Carpal tunnel syndrome   . Atrial fibrillation ([place])   . Anemia   . Actinic keratosis   . Intractable nausea and vomiting 05/31/2016  . Nausea & vomiting 05/30/2016  . Abnormal chest x-ray 05/30/2016  . Diabetes mellitus with complication ([place])   . Influenza A 05/21/2016  . Chronic atrial fibrillation ([place]) 05/20/2016  . Acute CHF (congestive heart failure) ([place]) 05/20/2016  . History of GI bleed 03/29/2016  . Chronic anticoagulation 03/29/2016  . Symptomatic anemia 03/19/2016  . Spinal stenosis of lumbar region 02/10/2016  . Varicose veins of left leg with edema 09/15/2015  . Preop cardiovascular exam 08/25/2015  . Iron deficiency anemia due to chronic blood loss 07/17/2015  . Anemia of  chronic renal failure, stage 3 (moderate) ([place]) 07/17/2015  . Anticoagulant causing adverse effect in therapeutic use 07/17/2015  . Symptomatic bradycardia 02/21/2015  . Bradycardia   . Acute deep vein thrombosis (DVT) of left lower extremity ([place]) 05/06/2014  . Abdominal wall mass of right lower quadrant 07/08/2013  . OA (osteoarthritis) of hip 04/03/2013  . Osteoarthritis of hip 01/18/2013  . Night sweats 09/06/2012  . Chest pain   . Anxiety and depression   . Gastroesophageal reflux disease   . Hepatitis   . Jaundice   . Hypertension 07/01/2010  . Hypercholesterolemia 07/01/2010  . DM2 (diabetes mellitus, type 2) ([place]) 07/01/2010  . Rheumatoid arthritis ([place]) 07/01/2010  . Sinus tachycardia 07/01/2010  . Chest pain, atypical 07/01/2010    Orientation RESPIRATION BLADDER Height & Weight     Self, Time, Situation, Place  Normal Continent Weight: 170 lb 13.7 oz (77.5 kg) Height:  5\' 7"  (170.2 cm)  BEHAVIORAL SYMPTOMS/MOOD NEUROLOGICAL BOWEL NUTRITION STATUS      Continent Diet(Heart Healthy)  AMBULATORY STATUS COMMUNICATION OF NEEDS Skin   Limited Assist Verbally Normal                       Personal Care Assistance Level of Assistance  Bathing, Dressing Bathing Assistance: Limited assistance   Dressing Assistance: Limited assistance     Functional Limitations Info  Sight Sight Info: Impaired   Speech Info: Impaired    SPECIAL CARE FACTORS FREQUENCY  PT (By licensed PT), OT (By licensed OT), Speech therapy  PT Frequency: 5x week OT Frequency: 5 x week     Speech Therapy Frequency: 5x week      Contractures Contractures Info: Not present    Additional Factors Info  Code Status, Allergies Code Status Info: Full Code Allergies Info: No known allergies           Current Medications (06/30/2017):  This is the current hospital active medication list Current Facility-Administered Medications  Medication Dose Route Frequency Provider Last Rate Last  Dose  . acetaminophen (TYLENOL) tablet 325-650 mg  325-650 mg Oral Q4H PRN Bary Leriche, PA-C   325 mg at 06/28/17 0144  . acetaminophen (TYLENOL) tablet 650 mg  650 mg Oral QHS Bary Leriche, PA-C   650 mg at 06/29/17 2129  . alum & mag hydroxide-simeth (MAALOX/MYLANTA) 200-200-20 MG/5ML suspension 30 mL  30 mL Oral Q4H PRN Love, Pamela S, PA-C      . baclofen (LIORESAL) tablet 5 mg  5 mg Oral TID Love, Pamela S, PA-C   5 mg at 06/30/17 0806  . bisacodyl (DULCOLAX) suppository 10 mg  10 mg Rectal Daily PRN Love, Pamela S, PA-C      . buPROPion [facility] SR) 12 hr tablet 150 mg  150 mg Oral BID Reesa Chew S, PA-C   150 mg at 06/30/17 2202  . diclofenac sodium (VOLTAREN) 1 % transdermal gel 2 g  2 g Topical QID Charlett Blake, MD   2 g at 06/30/17 1212  . diltiazem (CARDIZEM CD) 24 hr capsule 360 mg  360 mg Oral Daily Bary Leriche, PA-C   360 mg at 06/30/17 5427  . diphenhydrAMINE (BENADRYL) 12.5 MG/5ML elixir 12.5-25 mg  12.5-25 mg Oral Q6H PRN Love, Pamela S, PA-C      . ferrous gluconate (FERGON) tablet 324 mg  324 mg Oral BID WC Bary Leriche, PA-C   324 mg at 06/30/17 0623  . gabapentin (NEURONTIN) capsule 100 mg  100 mg Oral QHS Bary Leriche, PA-C   100 mg at 06/29/17 2129  . glimepiride (AMARYL) tablet 2 mg  2 mg Oral Q breakfast Bary Leriche, PA-C   2 mg at 06/30/17 7628  . guaiFENesin-dextromethorphan (ROBITUSSIN DM) 100-10 MG/5ML syrup 5-10 mL  5-10 mL Oral Q6H PRN Love, Pamela S, PA-C      . insulin aspart (novoLOG) injection 0-9 Units  0-9 Units Subcutaneous TID WC Bary Leriche, PA-C   2 Units at 06/30/17 1212  . leflunomide (ARAVA) tablet 20 mg  20 mg Oral Daily Bary Leriche, PA-C   20 mg at 06/30/17 3151  . metoprolol tartrate (LOPRESSOR) tablet 100 mg  100 mg Oral BID Bary Leriche, PA-C   100 mg at 06/30/17 7616  . multivitamin (PROSIGHT) tablet 1 tablet  1 tablet Oral BID Charlett Blake, MD   1 tablet at 06/30/17 [phone]  . nitroGLYCERIN (NITROSTAT) SL  tablet 0.4 mg  0.4 mg Sublingual Q5 min PRN Bary Leriche, PA-C   0.4 mg at 06/24/17 1062  . pantoprazole (PROTONIX) EC tablet 40 mg  40 mg Oral Daily Bary Leriche, PA-C   40 mg at 06/30/17 6948  . polyethylene glycol (MIRALAX / GLYCOLAX) packet 17 g  17 g Oral Daily PRN Love, Pamela S, PA-C      . predniSONE (DELTASONE) tablet 5 mg  5 mg Oral Daily Love, Ivan Anchors, PA-C   5 mg at 06/30/17 0807  . prochlorperazine (COMPAZINE) tablet 5-10 mg  5-10 mg Oral Q6H PRN Love, Pamela S, PA-C       Or  . prochlorperazine (COMPAZINE) injection 5-10 mg  5-10 mg Intramuscular Q6H PRN Love, Pamela S, PA-C       Or  . prochlorperazine (COMPAZINE) suppository 12.5 mg  12.5 mg Rectal Q6H PRN Love, Pamela S, PA-C      . rivaroxaban (XARELTO) tablet 20 mg  20 mg Oral Q supper Love, Pamela S, PA-C   20 mg at 06/29/17 1805  . rosuvastatin (CRESTOR) tablet 40 mg  40 mg Oral QPM Bary Leriche, PA-C   40 mg at 06/29/17 1805  . senna-docusate (Senokot-S) tablet 1 tablet  1 tablet Oral QHS PRN Love, Pamela S, PA-C      . sodium phosphate (FLEET) 7-19 GM/118ML enema 1 enema  1 enema Rectal Once PRN Love, Pamela S, PA-C      . vitamin B-12 (CYANOCOBALAMIN) tablet 1,000 mcg  1,000 mcg Oral QPM Love, Pamela S, PA-C   1,000 mcg at 06/29/17 1805     Discharge Medications: Please see discharge summary for a list of discharge medications.  Relevant Imaging Results:  Relevant Lab Results:   Additional Information [id number]  Elease Hashimoto, LCSW

## 2017-07-01 ENCOUNTER — Inpatient Hospital Stay (HOSPITAL_COMMUNITY): Payer: Medicare Other | Admitting: Physical Therapy

## 2017-07-01 ENCOUNTER — Inpatient Hospital Stay (HOSPITAL_COMMUNITY): Payer: Medicare Other | Admitting: Occupational Therapy

## 2017-07-01 LAB — GLUCOSE, CAPILLARY
GLUCOSE-CAPILLARY: 122 mg/dL — AB (ref 65–99)
Glucose-Capillary: 134 mg/dL — ABNORMAL HIGH (ref 65–99)
Glucose-Capillary: 188 mg/dL — ABNORMAL HIGH (ref 65–99)
Glucose-Capillary: 200 mg/dL — ABNORMAL HIGH (ref 65–99)

## 2017-07-01 NOTE — Progress Notes (Signed)
Subjective/Complaints:  Pt without new issues  ROS:  Denies CP,SOB, no vomiting, no diarrhea  Objective: Vital Signs: Blood pressure 139/78, pulse 70, temperature 97.8 F (36.6 C), temperature source Oral, resp. rate 18, height 5\' 7"  (1.702 m), weight 78.2 kg (172 lb 6.4 oz), SpO2 97 %. No results found. Results for orders placed or performed during the hospital encounter of 06/19/17 (from the past 72 hour(s))  Glucose, capillary     Status: Abnormal   Collection Time: 06/28/17 11:32 AM  Result Value Ref Range   Glucose-Capillary 133 (H) 65 - 99 mg/dL  Glucose, capillary     Status: Abnormal   Collection Time: 06/28/17  4:32 PM  Result Value Ref Range   Glucose-Capillary 217 (H) 65 - 99 mg/dL  Glucose, capillary     Status: Abnormal   Collection Time: 06/28/17  8:39 PM  Result Value Ref Range   Glucose-Capillary 116 (H) 65 - 99 mg/dL  Glucose, capillary     Status: None   Collection Time: 06/29/17  6:50 AM  Result Value Ref Range   Glucose-Capillary 98 65 - 99 mg/dL  Glucose, capillary     Status: Abnormal   Collection Time: 06/29/17 11:31 AM  Result Value Ref Range   Glucose-Capillary 219 (H) 65 - 99 mg/dL  Glucose, capillary     Status: Abnormal   Collection Time: 06/29/17  4:47 PM  Result Value Ref Range   Glucose-Capillary 228 (H) 65 - 99 mg/dL   Comment 1 Notify RN   Glucose, capillary     Status: Abnormal   Collection Time: 06/29/17  9:10 PM  Result Value Ref Range   Glucose-Capillary 121 (H) 65 - 99 mg/dL   Comment 1 Notify RN   Glucose, capillary     Status: Abnormal   Collection Time: 06/30/17  6:54 AM  Result Value Ref Range   Glucose-Capillary 127 (H) 65 - 99 mg/dL   Comment 1 Notify RN   Glucose, capillary     Status: Abnormal   Collection Time: 06/30/17 11:40 AM  Result Value Ref Range   Glucose-Capillary 180 (H) 65 - 99 mg/dL  Glucose, capillary     Status: Abnormal   Collection Time: 06/30/17  4:28 PM  Result Value Ref Range   Glucose-Capillary  210 (H) 65 - 99 mg/dL  Glucose, capillary     Status: Abnormal   Collection Time: 06/30/17  9:39 PM  Result Value Ref Range   Glucose-Capillary 134 (H) 65 - 99 mg/dL  Glucose, capillary     Status: Abnormal   Collection Time: 07/01/17  6:45 AM  Result Value Ref Range   Glucose-Capillary 134 (H) 65 - 99 mg/dL     HEENT: normal Cardio: RRR and no murmur Resp: CTA B/L and unlabored GI: BS positive and Non Tender and Non distended Extremity:  No Edema Skin:   Intact Neuro: Lethargic, Normal Sensory, Normal Motor, Inattention and Other left homonymous hemianopsia Musc/Skel:  Other Ulnar drift bilateral MCPs, Bouchard's deformity Right index finger, RIgh tknee effusion with tenderness Gen nad   Assessment/Plan: 1. Functional deficits secondary to Right PCA/post MCA infarct which require 3+ hours per day of interdisciplinary therapy in a comprehensive inpatient rehab setting. Physiatrist is providing close team supervision and 24 hour management of active medical problems listed below. Physiatrist and rehab team continue to assess barriers to discharge/monitor patient progress toward functional and medical goals. FIM: Function - Bathing Position: Shower(Shower bench) Body parts bathed by patient: Right arm, Right lower  leg, Left lower leg, Left arm, Chest, Abdomen, Front perineal area, Right upper leg, Left upper leg, Buttocks, Back Body parts bathed by helper: Back Assist Level: Supervision or verbal cues  Function- Upper Body Dressing/Undressing What is the patient wearing?: Pull over shirt/dress Pull over shirt/dress - Perfomed by patient: Thread/unthread right sleeve, Put head through opening, Pull shirt over trunk, Thread/unthread left sleeve Pull over shirt/dress - Perfomed by helper: Thread/unthread left sleeve Button up shirt - Perfomed by patient: Button/unbutton shirt Assist Level: Supervision or verbal cues Function - Lower Body Dressing/Undressing What is the patient  wearing?: Shoes, Socks, Pants, Underwear Position: (on toilet) Underwear - Performed by patient: Thread/unthread right underwear leg, Thread/unthread left underwear leg, Pull underwear up/down Pants- Performed by patient: Pull pants up/down, Thread/unthread right pants leg, Thread/unthread left pants leg Pants- Performed by helper: Thread/unthread left pants leg Non-skid slipper socks- Performed by patient: Don/doff right sock, Don/doff left sock Socks - Performed by patient: Don/doff right sock, Don/doff left sock Shoes - Performed by patient: Don/doff right shoe, Don/doff left shoe, Fasten right, Fasten left Assist for footwear: Supervision/touching assist Assist for lower body dressing: Supervision or verbal cues  Function - Toileting Toileting steps completed by patient: Adjust clothing prior to toileting, Performs perineal hygiene, Adjust clothing after toileting Toileting steps completed by helper: Adjust clothing prior to toileting, Performs perineal hygiene, Adjust clothing after toileting Toileting Assistive Devices: Grab bar or rail Assist level: Touching or steadying assistance (Pt.75%)  Function - Air cabin crew transfer assistive device: Grab bar Assist level to toilet: Touching or steadying assistance (Pt > 75%) Assist level from toilet: Touching or steadying assistance (Pt > 75%) Assist level to bedside commode (at bedside): Touching or steadying assistance (Pt > 75%) Assist level from bedside commode (at bedside): Touching or steadying assistance (Pt > 75%)  Function - Chair/bed transfer Chair/bed transfer activity did not occur: Safety/medical concerns Chair/bed transfer method: Stand pivot Chair/bed transfer assist level: Moderate assist (Pt 50 - 74%/lift or lower) Chair/bed transfer assistive device: Armrests Chair/bed transfer details: Tactile cues for sequencing, Tactile cues for weight shifting, Tactile cues for posture, Tactile cues for initiation, Tactile  cues for placement, Verbal cues for technique, Manual facilitation for placement, Manual facilitation for weight shifting, Verbal cues for sequencing  Function - Locomotion: Wheelchair Wheelchair activity did not occur: Safety/medical concerns Max wheelchair distance: 150 ft Assist Level: Supervision or verbal cues Wheel 50 feet with 2 turns activity did not occur: Safety/medical concerns Assist Level: Supervision or verbal cues Wheel 150 feet activity did not occur: Safety/medical concerns Assist Level: Supervision or verbal cues Function - Locomotion: Ambulation Assistive device: Walker-rolling Max distance: 26 ft Assist level: Moderate assist (Pt 50 - 74%) Assist level: Moderate assist (Pt 50 - 74%) Walk 50 feet with 2 turns activity did not occur: Safety/medical concerns Walk 150 feet activity did not occur: Safety/medical concerns Walk 10 feet on uneven surfaces activity did not occur: Safety/medical concerns  Function - Comprehension Comprehension: Auditory Comprehension assist level: Understands basic 75 - 89% of the time/ requires cueing 10 - 24% of the time, Understands basic 50 - 74% of the time/ requires cueing 25 - 49% of the time  Function - Expression Expression: Verbal Expression assist level: Expresses basic 75 - 89% of the time/requires cueing 10 - 24% of the time. Needs helper to occlude trach/needs to repeat words.  Function - Social Interaction Social Interaction assist level: Interacts appropriately 75 - 89% of the time - Needs redirection for appropriate  language or to initiate interaction.  Function - Problem Solving Problem solving assist level: Solves basic 50 - 74% of the time/requires cueing 25 - 49% of the time  Function - Memory Memory assistive device: Memory book Memory assist level: Recognizes or recalls 50 - 74% of the time/requires cueing 25 - 49% of the time Patient normally able to recall (first 3 days only): That he or she is in a hospital,  Current season, Staff names and faces  Medical Problem List and Plan: 1.Functional and visual-spatial deficitssecondary to right PCA/MCA infarct -CIR PT, OT, SLP ,pt would like to go SNF post d/c  2. DVT Prophylaxis/Anticoagulation: Contxarelto 20mg  /day, no bleeding complications check CBC on Monday 3.Chronic LBP/Right knee pain/Pain Management:tylenol prn and local measures. Continue to hold Norco and restart gabapentin for Sciatic pain 4. Mood:LCSW to follow for evaluation and support.Continue Wellbutrin bid 5. Neuropsych: This patientis not fullycapable of making decisions onherown behalf. 6. Skin/Wound Care:routine pressure relief measures 7. Fluids/Electrolytes/Nutrition:Monitor I/O. Check lytes in am.  8. A fib: Monitor HR bid. Continue Metoprolol bid with Cardizem daily. .  Vitals:   06/30/17 2105 07/01/17 0117  BP: 133/77 139/78  Pulse: 79 70  Resp:  18  Temp:  97.8 F (36.6 C)  SpO2:  97%  Adequate control 3/30 9. RA: managed with Arava and low dose prednisone.  10. GERD: On Protonix.  11.Hgb A1C- 6.9: T2DM: Monitor BS ac/hs. Resume Amaryl as po intake is good--continue to hold metformin for now.  CBG (last 3)  Recent Labs    06/30/17 1628 06/30/17 2139 07/01/17 0645  GLUCAP 210* 134* 134*  Adequate control 3/30 12.  Hypoalb- add prostat   LOS (Days) 12 A FACE TO FACE EVALUATION WAS PERFORMED  Charlett Blake 07/01/2017, 9:20 AM

## 2017-07-01 NOTE — Progress Notes (Signed)
Occupational Therapy Session Note  Patient Details  Name: [name] MRN: [id number] Date of Birth: 1945-10-10  Today's Date: 07/01/2017 OT Individual Time: [phone] OT Individual Time Calculation (min): 45 min    Short Term Goals: Week 2:  OT Short Term Goal 1 (Week 2): STG=LTG due to LOS  Skilled Therapeutic Interventions/Progress Updates:    Treatment session focused on ADLs/self care training, transfer training, pt education, and NMR reeducation. Upon entering, pt sitting in bed with HOB elevated and agreeable to AM ADLs. Pt transferred to sit EOB and into w/c with min A and vc. Pt completed w/c<>shower tx with min A and cues for hand/foot placement using grab bars. Showering completed in sit to stand level with S. Pt completed UB dressing with Set up assist with hospital gown and brief-pt did not have any clean clothes to wear. Pt requested to wear hospital socks and was don/doff with S in sitting. Grooming completed at sinkside at w/c level with set up assist. Pt left resting in w/c with needs met with call bell within reach. Educated on safety with transfers throughout session.   Therapy Documentation Precautions:  Precautions Precautions: Fall Precaution Comments: L inattention, L hemi, very fearful of falling Restrictions Weight Bearing Restrictions: No      Pain: Pain Assessment Pain Scale: Faces Faces Pain Scale: Hurts a little bit Pain Type: Neuropathic pain;Chronic pain Pain Location: Knee Pain Orientation: Right Pain Descriptors / Indicators: Aching Pain Onset: On-going Patients Stated Pain Goal: 0 Pain Intervention(s): Repositioned Multiple Pain Sites: No  See Function Navigator for Current Functional Status.   Therapy/Group: Individual Therapy  Delon Sacramento 07/01/2017, 12:19 PM

## 2017-07-01 NOTE — Progress Notes (Signed)
Physical Therapy Session Note  Patient Details  Name: [name] MRN: [id number] Date of Birth: [birth year]  Today's Date: 07/01/2017 PT Individual Time: [id number] PT Individual Time Calculation (min): 31 min   Short Term Goals: Week 2:  PT Short Term Goal 1 (Week 2): Pt will ambulate 50 ft with mod assist +1 & LRAD. PT Short Term Goal 2 (Week 2): Pt will initiate stair training.  PT Short Term Goal 3 (Week 2): Pt will propel w/c with min assist x 50 ft.   Skilled Therapeutic Interventions/Progress Updates: Pt presented in w/c agreeable to therapy. Pt unable to recall placement of memory book, PTA assisted in finding. Pt was able to use memory book to look up previous session with PTA and to recall activities. Pt transported to rehab gym and participated in x 3 games of Connect 4 in standing (at high/low table). Pt stood with single UE support and was able to play game with initially min cues for correct game play and requiring mod cues with more challenging situations. Pt propelled approx 169ft in w/c for endurance and required min cues for increased use of LUE to maintain straight path. Pt transported remaining distance and remained in w/c with QRB placed and chair alarm set.      Therapy Documentation Precautions:  Precautions Precautions: Fall Precaution Comments: L inattention, L hemi, very fearful of falling Restrictions Weight Bearing Restrictions: No    See Function Navigator for Current Functional Status.   Therapy/Group: Individual Therapy  Cam Dauphin  Tafari Humiston, PTA  07/01/2017, 12:16 PM

## 2017-07-02 ENCOUNTER — Inpatient Hospital Stay (HOSPITAL_COMMUNITY): Payer: Medicare Other | Admitting: Occupational Therapy

## 2017-07-02 LAB — GLUCOSE, CAPILLARY
GLUCOSE-CAPILLARY: 218 mg/dL — AB (ref 65–99)
GLUCOSE-CAPILLARY: 216 mg/dL — AB (ref 65–99)
Glucose-Capillary: 103 mg/dL — ABNORMAL HIGH (ref 65–99)
Glucose-Capillary: 183 mg/dL — ABNORMAL HIGH (ref 65–99)

## 2017-07-02 NOTE — Plan of Care (Signed)
  Problem: RH KNOWLEDGE DEFICIT Goal: RH STG INCREASE KNOWLEDGE OF DIABETES Description Patient be able to increase knowledge on diabetes ; patient claims she'll be able to manage her own meds and family member will be able to help her. Outcome: Progressing   Problem: RH KNOWLEDGE DEFICIT Goal: RH STG INCREASE KNOWLEDGE OF HYPERTENSION Description Patient will be able to increase knowledge on hypertension ; patient claims she'll be able to manage her meds and family member will be able to help her as well. Outcome: Progressing

## 2017-07-02 NOTE — Progress Notes (Signed)
Occupational Therapy Session Note  Patient Details  Name: [name] MRN: [id number] Date of Birth: 02/20/1946  Today's Date: 07/02/2017 OT Individual Time: 1346-1445 OT Individual Time Calculation (min): 59 min   Short Term Goals: Week 2:  OT Short Term Goal 1 (Week 2): STG=LTG due to LOS  Skilled Therapeutic Interventions/Progress Updates:    Pt greeted supine in bed with visitors present. Agreeable to shower with encouragement. Tx focus on functional transfers, cognitive remediation, Lt NMR/attention, and safety awareness during bathing, dressing, and grooming tasks. Squat and stand pivots today completed with Mod A due to posterior lean and motor planning deficits. She initiated L UE use in shower to wash feet and hair. Wash cloth dropped x1. Mod A for sit<stand at sink during LB dressing with cues for initiation and sustained attention. Presented items on her Lt side. Assist required for clothing orientation and donning her compression garments correctly. Pt adamant that she did not want to wear pants, just a long shirt that covered her buttocks. Amenable to placing towel in lap for decency purposes. Grooming/oral care tasks completed w/c level afterwards with min vcs for scanning to Lt for necessary items. At end of session pt was left in w/c with safety belt fastened, chair alarm set, and all needs within reach.   Therapy Documentation Precautions:  Precautions Precautions: Fall Precaution Comments: L inattention, L hemi, very fearful of falling Restrictions Weight Bearing Restrictions: No Vital Signs: Therapy Vitals Temp: 98.1 F (36.7 C) Temp Source: Oral Pulse Rate: 62 Resp: 18 BP: (!) 133/54 Patient Position (if appropriate): Sitting Oxygen Therapy SpO2: 98 % O2 Device: Room Air Pain: No c/o pain during session    ADL:      See Function Navigator for Current Functional Status.   Therapy/Group: Individual Therapy  Abbygael Curtiss A Kharisma Glasner 07/02/2017, 4:07 PM

## 2017-07-03 ENCOUNTER — Inpatient Hospital Stay (HOSPITAL_COMMUNITY): Payer: Medicare Other | Admitting: Occupational Therapy

## 2017-07-03 ENCOUNTER — Inpatient Hospital Stay (HOSPITAL_COMMUNITY): Payer: Medicare Other | Admitting: Physical Therapy

## 2017-07-03 ENCOUNTER — Inpatient Hospital Stay (HOSPITAL_COMMUNITY): Payer: Medicare Other

## 2017-07-03 LAB — GLUCOSE, CAPILLARY
GLUCOSE-CAPILLARY: 108 mg/dL — AB (ref 65–99)
GLUCOSE-CAPILLARY: 193 mg/dL — AB (ref 65–99)
GLUCOSE-CAPILLARY: 163 mg/dL — AB (ref 65–99)
GLUCOSE-CAPILLARY: 208 mg/dL — AB (ref 65–99)

## 2017-07-03 LAB — CBC
HEMATOCRIT: 41.1 % (ref 36.0–46.0)
Hemoglobin: 11.9 g/dL — ABNORMAL LOW (ref 12.0–15.0)
MCH: 23.5 pg — AB (ref 26.0–34.0)
MCHC: 29 g/dL — AB (ref 30.0–36.0)
MCV: 81.2 fL (ref 78.0–100.0)
Platelets: 277 10*3/uL (ref 150–400)
RBC: 5.06 MIL/uL (ref 3.87–5.11)
RDW: 18.8 % — ABNORMAL HIGH (ref 11.5–15.5)
WBC: 8.4 10*3/uL (ref 4.0–10.5)

## 2017-07-03 LAB — BASIC METABOLIC PANEL
Anion gap: 11 (ref 5–15)
BUN: 16 mg/dL (ref 6–20)
CHLORIDE: 108 mmol/L (ref 101–111)
CO2: 25 mmol/L (ref 22–32)
CREATININE: 0.71 mg/dL (ref 0.44–1.00)
Calcium: 9.4 mg/dL (ref 8.9–10.3)
GFR calc Af Amer: >60 mL/min (ref >60)
GFR calc non Af Amer: >60 mL/min (ref >60)
Glucose, Bld: 109 mg/dL — ABNORMAL HIGH (ref 65–99)
POTASSIUM: 3.2 mmol/L — AB (ref 3.5–5.1)
Sodium: 144 mmol/L (ref 135–145)

## 2017-07-03 MED ORDER — ABATACEPT 125 MG/ML ~~LOC~~ SOAJ: 125.0000 mg | SUBCUTANEOUS | Status: DC

## 2017-07-03 MED ORDER — POTASSIUM CHLORIDE CRYS ER 20 MEQ PO TBCR
20.0000 meq | EXTENDED_RELEASE_TABLET | Freq: Two times a day (BID) | ORAL | Status: AC
  Administered 2017-07-03 (×2): 20 meq via ORAL
  Filled 2017-07-03 (×2): qty 1

## 2017-07-03 MED ORDER — POTASSIUM CHLORIDE CRYS ER 20 MEQ PO TBCR
20.0000 meq | EXTENDED_RELEASE_TABLET | Freq: Every day | ORAL | Status: DC
  Administered 2017-07-04 – 2017-07-05 (×2): 20 meq via ORAL
  Filled 2017-07-03 (×2): qty 1

## 2017-07-03 NOTE — Plan of Care (Signed)
  Problem: Consults Goal: RH STROKE PATIENT EDUCATION Description See Patient Education module for education specifics  Outcome: Progressing Goal: Diabetes Guidelines if Diabetic/Glucose > 140 Description If diabetic or lab glucose is > 140 mg/dl - Initiate Diabetes/Hyperglycemia Guidelines & Document Interventions  Outcome: Progressing   Problem: RH BOWEL ELIMINATION Goal: RH STG MANAGE BOWEL WITH ASSISTANCE Description STG Manage Bowel with min Assistance.  Outcome: Progressing Flowsheets (Taken 07/03/2017 1525) STG: Pt will manage bowels with assistance: 6-Modified independent Goal: RH STG MANAGE BOWEL W/MEDICATION W/ASSISTANCE Description STG Manage Bowel with Medication with min Assistance.  Outcome: Progressing Flowsheets (Taken 07/03/2017 1525) STG: Pt will manage bowels with medication with assistance: 6-Modified independent   Problem: RH BLADDER ELIMINATION Goal: RH STG MANAGE BLADDER WITH ASSISTANCE Description STG Manage Bladder With min Assistance  Outcome: Progressing Flowsheets (Taken 07/03/2017 1525) STG: Pt will manage bladder with assistance: 6-Modified independent   Problem: RH SKIN INTEGRITY Goal: RH STG SKIN FREE OF INFECTION/BREAKDOWN Description With min assistance  Outcome: Progressing Goal: RH STG MAINTAIN SKIN INTEGRITY WITH ASSISTANCE Description STG Maintain Skin Integrity With min Assistance.  Outcome: Progressing Flowsheets (Taken 07/03/2017 1525) STG: Maintain skin integrity with assistance: 5-Supervision/cueing   Problem: RH SAFETY Goal: RH STG ADHERE TO SAFETY PRECAUTIONS W/ASSISTANCE/DEVICE Description STG Adhere to Safety Precautions With min Assistance/Device.  Outcome: Progressing Flowsheets (Taken 07/03/2017 1525) STG:Pt will adhere to safety precautions with assistance/device: 6-Modified independent Goal: RH STG DECREASED RISK OF FALL WITH ASSISTANCE Description STG Decreased Risk of Fall With min Assistance.  Outcome:  Progressing Flowsheets (Taken 07/03/2017 1525) GYF:[id number] risk of fall  with assistance/device: 4-Minimal assistance   Problem: RH PAIN MANAGEMENT Goal: RH STG PAIN MANAGED AT OR BELOW PT'S PAIN GOAL Description <3  Outcome: Progressing   Problem: RH KNOWLEDGE DEFICIT Goal: RH STG INCREASE KNOWLEDGE OF DIABETES Description Patient be able to increase knowledge on diabetes  Outcome: Progressing Goal: RH STG INCREASE KNOWLEDGE OF HYPERTENSION Description Patient will be able to increase knowledge on hypertension  Outcome: Progressing

## 2017-07-03 NOTE — Progress Notes (Signed)
Physical Therapy Session Note  Patient Details  Name: [name] MRN: [id number] Date of Birth: January 04, 1946  Today's Date: 07/03/2017 PT Individual Time: 0801-0901 and 1300-1345 PT Individual Time Calculation (min): 60 min and 45 min  Short Term Goals: Week 2:  PT Short Term Goal 1 (Week 2): Pt will ambulate 50 ft with mod assist +1 & LRAD. PT Short Term Goal 2 (Week 2): Pt will initiate stair training.  PT Short Term Goal 3 (Week 2): Pt will propel w/c with min assist x 50 ft.   Skilled Therapeutic Interventions/Progress Updates:   Session 1:  Pt received in w/c, agreeable to therapy, and oriented to date. Pt denies pain currently but states her R knee was bothering her last night. Pt donned shoes and socks with set up assist and verbal cuing for technique. Pt donned disposable scrub pants and needed assistance to thread pants over bilateral shoes and pull over hips. Therapist propelled pt in w/c to gym for energy conservation. Pt performed sit<>stand with min assist and verbal cuing for anterior weight shift and hand placement. Throughout session, pt demonstrated decreased eccentric control stand>sit and required verbal cuing to control descent. Pt ambulated with RW 25 ft x2 with min assist and verbal cuing for increased step length and upright posture. Therapist provided pt w/ RW that was adjusted to match her height as her previous one appeared too short. Pt reports R knee feeling "shaky" and rest breaks taken prn. Pt engaged in standing horseshoe game w/ RW and min assist to improve dynamic standing balance, anterior weight shift and L inattention by placing horseshoes and ring on L side of pt. Pt son arrived during therapy and was educated on pt's deficits, purpose of therapy and progress made so far. Therapist propelled pt in w/c to day room for time management and pt performed Kinetron seated in w/c for 5 min at 60cm/s to increase endurance and activity tolerance. Pt required verbal cuing  for L foot placement as it frequently fell off foot plate w/o pt noticing. Pt reports medium difficulty with activity. Therapist propelled pt in w/c back to room, and was left seated in w/c, quick release belt on, chair alarm on, telesitter in place and all needs within reach.  Session 2; Pt received in w/c, friend present, agreeable to therapy. Pt reports pain in R knee, RN recently applied Voltaren gel, and pt reports rubbing her knee eases the pain. Therapist propelled pt in w/c to gym. Pt required multiple sit<>stand attempts w/ min-mod assist and verbal cuing for anterior weight shift and hand placement. Pt more successful when RW in front for support. Pt engaged in [employer] card game for cognitive task while in standing to increase standing tolerance and endurance. Go Fish game focused on L inattention and problem solving, requiring verbal cuing for locating card and finding match. Pt required encouragement to stand w/o UE support and was able to for brief periods of time. Pt able to maintain standing position >5 minutes. Therapist propelled pt to day room for Nustep. Pt performed NuStep at level 3 for 5 min with BUE and BLE for strengthening and endurance. Therapist propelled pt back to room and was left seated in w/c, quick release belt and chair alarm on, telesitter in place and all needs within reach.   Therapy Documentation Precautions:  Precautions Precautions: Fall Precaution Comments: L inattention, L hemi, very fearful of falling Restrictions Weight Bearing Restrictions: No   See Function Navigator for Current Functional Status.  Therapy/Group: Individual Therapy  Caffie Damme 07/03/2017, 9:22 AM

## 2017-07-03 NOTE — Progress Notes (Signed)
Occupational Therapy Session Note  Patient Details  Name: [name] MRN: [id number] Date of Birth: 28-May-1945  Today's Date: 07/03/2017 OT Individual Time: 0930-1030 OT Individual Time Calculation (min): 60 min    Short Term Goals: Week 2:  OT Short Term Goal 1 (Week 2): STG=LTG due to LOS  Skilled Therapeutic Interventions/Progress Updates:    Pt presented in w/c in room with no c/o pain, and agreed to therapy session. She was frustrated due to not being able to "get through to the post office". She was asked to go over the phone number and try again and was dialing the number incorrectly due to her perseverating on some numbers. She was able to enter number correctly when given to her verbally and was successful reaching the post office. She requested to groom and do oral care, she stood at sink in RW with ADL items on the left that she was able to locate with Min VCs. She amb in RW to bathroom Min A, to retreive a washcloth on the shelf for grooming. She self propelled in w/c to nurses station with Mod VCs to watch for the wall on L side to improve strength in UE, w/c management. Pt completed therapy session with dynamic standing ball tapping activity switching between LUE and RUE using RW for maintaining balance, activity required Min A for safety and cueing to use LUE in activty. Upgraded activity to standing balance w/o AD Max A +2 with VCs for posture and to achieve balance, once balance was established Mod A using HHE, pt stood approximately 55min. She was left in room in w/c with safety belt and chair alarm working on memory book with SLP present.   Therapy Documentation Precautions:  Precautions Precautions: Fall Precaution Comments: L inattention, L hemi, very fearful of falling Restrictions Weight Bearing Restrictions: No    Therapy/Group: Individual Therapy  Vilinda Blanks 07/03/2017, 11:04 AM

## 2017-07-03 NOTE — Discharge Summary (Signed)
Physician Discharge Summary  Patient ID: [name] MRN: [id number] DOB/AGE: 1945-10-25 72 y.o.  Admit date: 06/19/2017 Discharge date: 07/05/2017  Discharge Diagnoses:  Principal Problem:   Stroke, acute, embolic [facility]) Active Problems:   DM2 (diabetes mellitus, type 2) (HCC)   Rheumatoid arthritis ([place])   Iron deficiency anemia due to chronic blood loss   Spinal stenosis of lumbar region   Atrial fibrillation (HCC)   Thyroid nodule   Discharged Condition:  stable  Significant Diagnostic Studies:  Mr Brain 79 Contrast  Result Date: 06/15/2017 CLINICAL DATA:  Headache and blurry vision.  Right PCA infarct. EXAM: MRI HEAD WITHOUT CONTRAST TECHNIQUE: Multiplanar, multiecho pulse sequences of the brain and surrounding structures were obtained without intravenous contrast. COMPARISON:  CTA head neck 06/14/2017 FINDINGS: Brain: The midline structures are normal. There is abnormal diffusion restriction within the right occipital lobe, right temporal lobe and posterior right insula. These areas are within the right MCA and PCA territories. No mass lesion, hydrocephalus, dural abnormality or extra-axial collection. Old right cerebellar infarct. Mild periventricular T2 hyperintensity. There is hyperintense T2-weighted signal corresponding to the areas of abnormal diffusion restriction. Gradient echo imaging demonstrates petechial hemorrhage in the right PCA infarct distribution. No space-occupying hematoma. No age-advanced or lobar predominant atrophy. Vascular: Major intracranial arterial and venous sinus flow voids are preserved. Skull and upper cervical spine: The visualized skull base, calvarium, upper cervical spine and extracranial soft tissues are normal. Sinuses/Orbits: No fluid levels or advanced mucosal thickening. No mastoid or middle ear effusion. Normal orbits. IMPRESSION: 1. Acute infarcts of the posterior right middle cerebral artery territory and the right posterior cerebral artery  territory. 2. Petechial hemorrhage in the distribution of the right PCA infarct. No intraparenchymal hematoma. No mass effect or hydrocephalus. 3. Chronic small vessel disease. Electronically Signed   By: Ulyses Jarred M.D.   On: 06/15/2017 00:17    Labs:  Basic Metabolic Panel: BMP Latest Ref Rng & Units 07/03/2017 06/26/2017 06/20/2017  Glucose 65 - 99 mg/dL 109(H) 148(H) 152(H)  BUN 6 - 20 mg/dL 16 16 16   Creatinine 0.44 - 1.00 mg/dL 0.71 0.94 0.80  BUN/Creat Ratio 12 - 28 - - -  Sodium 135 - 145 mmol/L 144 142 143  Potassium 3.5 - 5.1 mmol/L 3.2(L) 4.0 3.5  Chloride 101 - 111 mmol/L 108 107 109  CO2 22 - 32 mmol/L 25 24 24   Calcium 8.9 - 10.3 mg/dL 9.4 9.5 9.4    CBC: CBC Latest Ref Rng & Units 07/03/2017 06/26/2017 06/20/2017  WBC 4.0 - 10.5 K/uL 8.4 7.8 8.7  Hemoglobin 12.0 - 15.0 g/dL 11.9(L) 11.9(L) 11.4(L)  Hematocrit 36.0 - 46.0 % 41.1 40.4 38.0  Platelets 150 - 400 K/uL 277 290 248    CBG: Recent Labs  Lab 07/03/17 1641 07/03/17 2036 07/04/17 0634 07/04/17 1211 07/04/17 1648  GLUCAP 163* 208* 131* 162* 252*    Brief HPI:    [name] is a 72 y.o. female with history of Afib, diabetes and RA who presented on 06/14/17 with headache and blurred vision after having stopped xarelto on 06/12/17 for nerve block.  MRI revealed acute infarcts of the posterior right middle cerebral artery territory and the right posterior cerebral artery territory with petechial hemorrhage in the distribution of the right PCA infarct. Dr. Erlinda Hong felt that stroke embolic due to known A Fib. She was kept on  325mg  ECASA daily with recommendations to resume  Xarelto in 7 days. Patient with ongoing left field cut with  deficits in visual-spatial awareness and coordination, left inattention, significant cognitive deficits (MoCA 10/30) with lack of awareness of deficits and unsteady gait.  CIR was recommended due to functional deficits.      Hospital Course: [name] was admitted to rehab 06/19/2017 for  inpatient therapies to consist of PT, ST and OT at least three hours five days a week. Past admission physiatrist, therapy team and rehab RN have worked together to provide customized collaborative inpatient rehab. Xarelto was resumed on 3/21 and she is tolerating this without SE. Serial check of CBC showed that H/H and platelets are stable. She continues on iron supplement for iron deficiency anemia. Her mood has been stable on Wellbutrin bid. RA has been managed on Arava and low dose prednisone with plans to resume Orencia this Thursday. Right knee and hip pain has been treated with Voltaren gel qid as well as tylenol prn.   Baclofen was added to help manage spasticity. Blood pressures and heart rate have been monitored on bid basis and have been reasonably controlled on Cadizem and metoprolol. Blood sugars have been monitored on ac/hs basis and she continues on amaryl 2 mg daily. Renal status has been stable but check of lytes on 4/1 revealed hypokalemia therefore K dur was added for supplement. Po intake has been good and she is continent of bowel and bladder. She has been making progress but continues to be limited by weakness as well as cognitive deficits. Family has elected on SNF for further therapies and she was discharged to [facility] on 07/05/17.    Rehab course: During patient's stay in rehab weekly team conferences were held to monitor patient's progress, set goals and discuss barriers to discharge. At admission, patient required total assist with mobility and mod assist with basic self care tasks.  She exhibited severe to moderate cognitive linguistic deficits affecting problem solving, comprehension, awareness of deficits as well as perseveration. She  has had improvement in activity tolerance, balance, postural control as well as ability to compensate for deficits. She has had improvement in functional use  RLE as well as improvement in awareness.  She is able to perform grooming and  oral care with supervision. She requires min assist with mod verbal cues to complete ADL tasks. She requires min to max assist for transfer depending on fatigue level. She continues to be limited by significant fatigue in the afternoons as well as RLE weakness. She currently requires max assist with cognitive tasks due to severe cognitive impairment.     Disposition: [place].  Diet: Heart healthy/diabetic   Special Instructions: 1. Will need thyroid ultrasound to evaluated multiple thyroid nodules.  2. Recheck BMET in 5-7 days to monitor potassium levels.  3. Monitor BS ac/hs and use SSI per protocol.    Discharge Instructions    Ambulatory referral to Physical Medicine Rehab   Complete by:  As directed    3-4 weeks follow up appointment     Allergies as of 07/04/2017   No Known Allergies     Medication List    STOP taking these medications   aspirin 325 MG tablet   Baclofen 5 MG Tabs   bethanechol 5 MG tablet Commonly known as:  URECHOLINE   insulin aspart 100 UNIT/ML injection Commonly known as:  novoLOG   potassium chloride 10 MEQ tablet Commonly known as:  KLOR-CON 10   VITAMIN B 12 PO Replaced by:  cyanocobalamin 1000 MCG tablet     TAKE these medications  acetaminophen 325 MG tablet Commonly known as:  TYLENOL Take 1-2 tablets (325-650 mg total) by mouth every 4 (four) hours as needed for mild pain. What changed:    how much to take  reasons to take this   buPROPion 150 MG 12 hr tablet Commonly known as:  WELLBUTRIN SR Take 150 mg by mouth 2 (two) times daily.   cyanocobalamin 1000 MCG tablet Take 1 tablet (1,000 mcg total) by mouth every evening. Start taking on:  07/05/2017 Replaces:  VITAMIN B 12 PO   diltiazem 360 MG 24 hr capsule Commonly known as:  CARDIZEM CD TAKE 1 CAPSULE (360 MG TOTAL) BY MOUTH DAILY.   esomeprazole 40 MG capsule Commonly known as:  NEXIUM Take 40 mg by mouth every morning.   ferrous gluconate 324 MG  tablet Commonly known as:  FERGON Take 1 tablet (324 mg total) by mouth 2 (two) times daily with a meal.   gabapentin 100 MG capsule Commonly known as:  NEURONTIN Take 1 capsule (100 mg total) by mouth at bedtime. What changed:    medication strength  how much to take  when to take this  reasons to take this   glimepiride 2 MG tablet Commonly known as:  AMARYL Take 1 tablet (2 mg total) by mouth daily with breakfast. Start taking on:  07/05/2017   leflunomide 20 MG tablet Commonly known as:  ARAVA Take 20 mg by mouth daily.   metoprolol tartrate 100 MG tablet Commonly known as:  LOPRESSOR Take 1 tablet (100 mg total) by mouth 2 (two) times daily.   nitroGLYCERIN 0.4 MG SL tablet Commonly known as:  NITROSTAT Place 1 tablet (0.4 mg total) under the tongue every 5 (five) minutes as needed for chest pain (x 3 doses).   ORENCIA CLICKJECT 765 MG/ML Soaj Generic drug:  Abatacept Inject 125 mg into the skin every Thursday.   potassium chloride SA 20 MEQ tablet Commonly known as:  K-DUR,KLOR-CON Take 1 tablet (20 mEq total) by mouth daily. Start taking on:  07/05/2017   predniSONE 5 MG tablet Commonly known as:  DELTASONE Take 5 mg by mouth daily.   PRESERVISION AREDS 2 Caps Take 1 capsule by mouth 2 (two) times daily.   rivaroxaban 20 MG Tabs tablet Commonly known as:  XARELTO Take 1 tablet (20 mg total) by mouth daily with supper. Start taking on:  07/05/2017   rosuvastatin 40 MG tablet Commonly known as:  CRESTOR Take 1 tablet (40 mg total) by mouth every evening.   senna-docusate 8.6-50 MG tablet Commonly known as:  Senokot-S Take 1 tablet by mouth at bedtime as needed for mild constipation.   Vitamin D3 1000 units Caps Take 1,000 Units by mouth every evening.   VOLTAREN 1 % Gel Generic drug:  diclofenac sodium Apply 2 g topically at bedtime as needed for pain. Knees, calf      Contact information for after-discharge care    Destination     [facility] .   Service:  Skilled Nursing Contact information: [address] [place] [phone]              Signed: Bary Leriche 07/04/2017, 6:42 PM

## 2017-07-03 NOTE — Progress Notes (Signed)
Subjective/Complaints:  No issues overntie  ROS:  Denies CP,SOB, no vomiting, no diarrhea  Objective: Vital Signs: Blood pressure 140/77, pulse 75, temperature 98.9 F (37.2 C), temperature source Oral, resp. rate 16, height 5\' 7"  (1.702 m), weight 78.2 kg (172 lb 6.4 oz), SpO2 99 %. No results found. Results for orders placed or performed during the hospital encounter of 06/19/17 (from the past 72 hour(s))  Glucose, capillary     Status: Abnormal   Collection Time: 06/30/17 11:40 AM  Result Value Ref Range   Glucose-Capillary 180 (H) 65 - 99 mg/dL  Glucose, capillary     Status: Abnormal   Collection Time: 06/30/17  4:28 PM  Result Value Ref Range   Glucose-Capillary 210 (H) 65 - 99 mg/dL  Glucose, capillary     Status: Abnormal   Collection Time: 06/30/17  9:39 PM  Result Value Ref Range   Glucose-Capillary 134 (H) 65 - 99 mg/dL  Glucose, capillary     Status: Abnormal   Collection Time: 07/01/17  6:45 AM  Result Value Ref Range   Glucose-Capillary 134 (H) 65 - 99 mg/dL  Glucose, capillary     Status: Abnormal   Collection Time: 07/01/17 11:48 AM  Result Value Ref Range   Glucose-Capillary 188 (H) 65 - 99 mg/dL  Glucose, capillary     Status: Abnormal   Collection Time: 07/01/17  4:40 PM  Result Value Ref Range   Glucose-Capillary 200 (H) 65 - 99 mg/dL  Glucose, capillary     Status: Abnormal   Collection Time: 07/01/17  9:27 PM  Result Value Ref Range   Glucose-Capillary 122 (H) 65 - 99 mg/dL  Glucose, capillary     Status: Abnormal   Collection Time: 07/02/17  6:39 AM  Result Value Ref Range   Glucose-Capillary 103 (H) 65 - 99 mg/dL  Glucose, capillary     Status: Abnormal   Collection Time: 07/02/17 11:18 AM  Result Value Ref Range   Glucose-Capillary 218 (H) 65 - 99 mg/dL  Glucose, capillary     Status: Abnormal   Collection Time: 07/02/17  4:43 PM  Result Value Ref Range   Glucose-Capillary 216 (H) 65 - 99 mg/dL  Glucose, capillary     Status: Abnormal    Collection Time: 07/02/17  8:57 PM  Result Value Ref Range   Glucose-Capillary 183 (H) 65 - 99 mg/dL  Glucose, capillary     Status: Abnormal   Collection Time: 07/03/17  6:26 AM  Result Value Ref Range   Glucose-Capillary 108 (H) 65 - 99 mg/dL     HEENT: normal Cardio: RRR and no murmur Resp: CTA B/L and unlabored GI: BS positive and Non Tender and Non distended Extremity:  No Edema Skin:   Intact Neuro: Lethargic, Normal Sensory, Normal Motor, Inattention and Other left homonymous hemianopsia Musc/Skel:  Other Ulnar drift bilateral MCPs, Bouchard's deformity Right index finger, RIgh tknee effusion with tenderness Gen nad   Assessment/Plan: 1. Functional deficits secondary to Right PCA/post MCA infarct which require 3+ hours per day of interdisciplinary therapy in a comprehensive inpatient rehab setting. Physiatrist is providing close team supervision and 24 hour management of active medical problems listed below. Physiatrist and rehab team continue to assess barriers to discharge/monitor patient progress toward functional and medical goals. FIM: Function - Bathing Position: Shower Body parts bathed by patient: Right arm, Right lower leg, Left lower leg, Left arm, Chest, Abdomen, Front perineal area, Right upper leg, Left upper leg, Buttocks Body parts bathed by  helper: Back Assist Level: Touching or steadying assistance(Pt > 75%)  Function- Upper Body Dressing/Undressing What is the patient wearing?: Pull over shirt/dress Pull over shirt/dress - Perfomed by patient: Thread/unthread right sleeve, Put head through opening, Pull shirt over trunk, Thread/unthread left sleeve Pull over shirt/dress - Perfomed by helper: Thread/unthread left sleeve Button up shirt - Perfomed by patient: Button/unbutton shirt Assist Level: Supervision or verbal cues Function - Lower Body Dressing/Undressing What is the patient wearing?: Maryln Manuel, Underwear, Shoes Position: Education officer, museum at  [employer] - Performed by patient: Thread/unthread right underwear leg, Thread/unthread left underwear leg, Pull underwear up/down Underwear - Performed by helper: Pull underwear up/down(briefs (no clean underwear)) Pants- Performed by patient: Pull pants up/down, Thread/unthread right pants leg, Thread/unthread left pants leg Pants- Performed by helper: Thread/unthread left pants leg Non-skid slipper socks- Performed by patient: Don/doff right sock, Don/doff left sock Socks - Performed by patient: Don/doff right sock, Don/doff left sock Shoes - Performed by patient: Don/doff right shoe, Don/doff left shoe TED Hose - Performed by helper: Don/doff right TED hose, Don/doff left TED hose Assist for footwear: Supervision/touching assist Assist for lower body dressing: Touching or steadying assistance (Pt > 75%)  Function - Toileting Toileting steps completed by patient: Adjust clothing prior to toileting, Performs perineal hygiene, Adjust clothing after toileting Toileting steps completed by helper: Adjust clothing prior to toileting, Performs perineal hygiene, Adjust clothing after toileting Toileting Assistive Devices: Grab bar or rail Assist level: Touching or steadying assistance (Pt.75%)  Function - Air cabin crew transfer assistive device: Grab bar Assist level to toilet: Touching or steadying assistance (Pt > 75%) Assist level from toilet: Touching or steadying assistance (Pt > 75%) Assist level to bedside commode (at bedside): Touching or steadying assistance (Pt > 75%) Assist level from bedside commode (at bedside): Touching or steadying assistance (Pt > 75%)  Function - Chair/bed transfer Chair/bed transfer activity did not occur: Safety/medical concerns Chair/bed transfer method: Stand pivot Chair/bed transfer assist level: Touching or steadying assistance (Pt > 75%) Chair/bed transfer assistive device: Armrests Chair/bed transfer details: Tactile cues for  sequencing, Tactile cues for weight shifting, Tactile cues for posture, Tactile cues for initiation, Tactile cues for placement, Verbal cues for technique, Manual facilitation for placement, Manual facilitation for weight shifting, Verbal cues for sequencing  Function - Locomotion: Wheelchair Wheelchair activity did not occur: Safety/medical concerns Max wheelchair distance: 150 ft Assist Level: Supervision or verbal cues Wheel 50 feet with 2 turns activity did not occur: Safety/medical concerns Assist Level: Supervision or verbal cues Wheel 150 feet activity did not occur: Safety/medical concerns Assist Level: Supervision or verbal cues Function - Locomotion: Ambulation Assistive device: Walker-rolling Max distance: 26 ft Assist level: Moderate assist (Pt 50 - 74%) Assist level: Moderate assist (Pt 50 - 74%) Walk 50 feet with 2 turns activity did not occur: Safety/medical concerns Walk 150 feet activity did not occur: Safety/medical concerns Walk 10 feet on uneven surfaces activity did not occur: Safety/medical concerns  Function - Comprehension Comprehension: Auditory Comprehension assist level: Understands basic 75 - 89% of the time/ requires cueing 10 - 24% of the time, Understands basic 50 - 74% of the time/ requires cueing 25 - 49% of the time  Function - Expression Expression: Verbal Expression assist level: Expresses basic 75 - 89% of the time/requires cueing 10 - 24% of the time. Needs helper to occlude trach/needs to repeat words.  Function - Social Interaction Social Interaction assist level: Interacts appropriately 75 - 89% of the time - Needs  redirection for appropriate language or to initiate interaction.  Function - Problem Solving Problem solving assist level: Solves basic 50 - 74% of the time/requires cueing 25 - 49% of the time  Function - Memory Memory assistive device: Memory book Memory assist level: Recognizes or recalls 50 - 74% of the time/requires cueing 25  - 49% of the time Patient normally able to recall (first 3 days only): That he or she is in a hospital, Current season, Staff names and faces, Location of own room  Medical Problem List and Plan: 1.Functional and visual-spatial deficitssecondary to right PCA/MCA infarct -CIR PT, OT, SLP ,pt would like to go SNF post d/c  2. DVT Prophylaxis/Anticoagulation: Contxarelto 20mg  /day, no bleeding complications check CBC today 3.Chronic LBP/Right knee pain/Pain Management:tylenol prn and local measures. Continue to hold Norco and restart gabapentin for Sciatic pain 4. Mood:LCSW to follow for evaluation and support.Continue Wellbutrin bid 5. Neuropsych: This patientis not fullycapable of making decisions onherown behalf. 6. Skin/Wound Care:routine pressure relief measures 7. Fluids/Electrolytes/Nutrition:Monitor I/O. BMET today  8. A fib: Monitor HR bid. Continue Metoprolol bid with Cardizem daily. .  Vitals:   07/02/17 2213 07/03/17 0007  BP: (!) 146/86 140/77  Pulse: 74 75  Resp:  16  Temp:  98.9 F (37.2 C)  SpO2:  99%  Adequate control 4/1 9. RA: managed with Arava and low dose prednisone.  10. GERD: On Protonix.  11.Hgb A1C- 6.9: T2DM: Monitor BS ac/hs. Resume Amaryl as po intake is good--continue to hold metformin for now.  CBG (last 3)  Recent Labs    07/02/17 1643 07/02/17 2057 07/03/17 0626  GLUCAP 216* 183* 108*  Adequate control 4/1 12.  Hypoalb- add prostat   LOS (Days) 14 A FACE TO FACE EVALUATION WAS PERFORMED  Charlett Blake 07/03/2017, 7:53 AM

## 2017-07-03 NOTE — Progress Notes (Signed)
Speech Language Pathology Daily Session Note  Patient Details  Name: [name] MRN: [id number] Date of Birth: Aug 15, 1945  Today's Date: 07/03/2017 SLP Individual Time: 3299-2426 SLP Individual Time Calculation (min): 45 min  Short Term Goals: Week 2: SLP Short Term Goal 1 (Week 2): Pt will demonstrate sustained attention to functional tasks for 20 minutes with mod A verbal cues for redirection, SLP Short Term Goal 2 (Week 2): Pt will identify 2 physical and 2 cognitive deficits due to CVA with mod A verbal cues. SLP Short Term Goal 3 (Week 2): Pt will demonstrate functional problem solving for basic and familiar tasks with mod A verbal cues. SLP Short Term Goal 4 (Week 2): Pt will demonstrate auditory comprehension of 1-2 step commands during functional tasks in 75% of opportunties with Mod A verbal/question cues. SLP Short Term Goal 5 (Week 2): Pt will scan to midline in 50% of opportunties with Mod A verbal/visual cues.  SLP Short Term Goal 6 (Week 2): Pt will utilize external memory aids to recall new, daily information with mod A verbal/question cues.   Skilled Therapeutic Interventions:Skilled ST services focused on cognitive skills. SLP facilitated functional problem solving during PO consumption of a biscuit, brought in my family, pt required mod A fading to min A verbal/question cues for problem solving, initially eating the paper around the biscuit. SLP facilitated identification of deficits, pt listed 2 cognitive and 2 physical deficits with mod A verbal/question cues. SLP facilitated problem solving skills in matching card task around a wheel, pt required mod A verbal cues for problem solving and max-mod A verbal cues to scan past midline towards left. Pt demonstrated sustained attention for 15 minutes, then required min A verbal cues after 20 minutes and max A verbal cues after 30 minutes, pt began to fall asleep. Pt was left in room with call bell within reach and safety belt on.  Recommend to continue skilled ST services.       Function:  Eating Eating                 Cognition Comprehension Comprehension assist level: Understands basic 75 - 89% of the time/ requires cueing 10 - 24% of the time  Expression   Expression assist level: Expresses basic 75 - 89% of the time/requires cueing 10 - 24% of the time. Needs helper to occlude trach/needs to repeat words.  Social Interaction Social Interaction assist level: Interacts appropriately 75 - 89% of the time - Needs redirection for appropriate language or to initiate interaction.  Problem Solving Problem solving assist level: Solves basic 50 - 74% of the time/requires cueing 25 - 49% of the time;Solves basic 75 - 89% of the time/requires cueing 10 - 24% of the time  Memory Memory assist level: Recognizes or recalls 50 - 74% of the time/requires cueing 25 - 49% of the time    Pain Pain Assessment Pain Score: 0-No pain Faces Pain Scale: No hurt  Therapy/Group: Individual Therapy  Raegan Sipp  [facility] 07/03/2017, 1:56 PM

## 2017-07-04 ENCOUNTER — Inpatient Hospital Stay (HOSPITAL_COMMUNITY): Payer: Medicare Other | Admitting: Physical Therapy

## 2017-07-04 ENCOUNTER — Inpatient Hospital Stay (HOSPITAL_COMMUNITY): Payer: Medicare Other | Admitting: Occupational Therapy

## 2017-07-04 ENCOUNTER — Inpatient Hospital Stay (HOSPITAL_COMMUNITY): Payer: Medicare Other | Admitting: Speech Pathology

## 2017-07-04 LAB — GLUCOSE, CAPILLARY
GLUCOSE-CAPILLARY: 162 mg/dL — AB (ref 65–99)
GLUCOSE-CAPILLARY: 252 mg/dL — AB (ref 65–99)
GLUCOSE-CAPILLARY: 161 mg/dL — AB (ref 65–99)
Glucose-Capillary: 131 mg/dL — ABNORMAL HIGH (ref 65–99)

## 2017-07-04 LAB — FERRITIN: Ferritin: 27 ng/mL (ref 11–307)

## 2017-07-04 LAB — IRON: Iron: 31 ug/dL (ref 28–170)

## 2017-07-04 MED ORDER — ACETAMINOPHEN 325 MG PO TABS: 325.0000 mg | ORAL_TABLET | ORAL | Status: DC | PRN

## 2017-07-04 MED ORDER — GABAPENTIN 100 MG PO CAPS: 100.0000 mg | ORAL_CAPSULE | Freq: Every day | ORAL | Status: DC

## 2017-07-04 MED ORDER — POTASSIUM CHLORIDE CRYS ER 20 MEQ PO TBCR: 20.0000 meq | EXTENDED_RELEASE_TABLET | Freq: Every day | ORAL | Status: DC

## 2017-07-04 MED ORDER — METOPROLOL TARTRATE 100 MG PO TABS: 100.0000 mg | ORAL_TABLET | Freq: Two times a day (BID) | ORAL | Status: DC

## 2017-07-04 MED ORDER — RIVAROXABAN 20 MG PO TABS: 20.0000 mg | ORAL_TABLET | Freq: Every day | ORAL | Status: DC

## 2017-07-04 MED ORDER — CYANOCOBALAMIN 1000 MCG PO TABS: 1000.0000 ug | ORAL_TABLET | Freq: Every evening | ORAL | Status: DC

## 2017-07-04 MED ORDER — GLIMEPIRIDE 2 MG PO TABS: 2.0000 mg | ORAL_TABLET | Freq: Every day | ORAL | Status: DC

## 2017-07-04 NOTE — Progress Notes (Signed)
Social Work Patient ID: [name], female   DOB: 1945-06-13, 72 y.o.   MRN: [id number] Spoke with [facility] to ask about beds and questions. Concern is arthritis shot and the cost, informed she brings that from home and can continue to do this. Will re-evaluate and contact this worker back.

## 2017-07-04 NOTE — Progress Notes (Signed)
Speech Language Pathology Discharge Summary  Patient Details  Name: [name] MRN: [id number] Date of Birth: 05-Jun-1945  Today's Date: 07/04/2017 SLP Individual Time: 1110-1205 SLP Individual Time Calculation (min): 55 min   Skilled Therapeutic Interventions:   Pt was seen for skilled ST targeting cognitive goals.  SLP facilitated the session with max to total assist to read information from her memory notebook due to very poor visual scanning to the left of midline and no awareness of deficits.   With a simplified symbol cancellation task, pt was able to locate 100% of targets with mod-max assist multimodal cues to identify targets to the left of midline.   Pt was able to sustain her attention to the abovementioned tasks for 1 minute intervals with mod-max assist multimodal cues for redirection.  Pt was returned to room and left in wheelchair with call bell within reach, quick release belt donned, and chair alarm set.  Pt is discharging to SNF tomorrow.       Patient has met 1 of 4 long term goals.  Patient to discharge at overall Max level.  Reasons goals not met:     Clinical Impression/Discharge Summary:   Pt has made slow functional gains while inpatient and is discharging having met 1 out of 4 short term goals.  Pt is currently max assist for tasks due to severe cognitive impairment s/p R MCA infarct.   Pt/family education is complete for this level of care but would recommend ongoing education as part of ST interventions at SNF.  Pt is discharging with recommendations for ST follow up at next level of care to address cognitive function.    Care Partner:  Caregiver Able to Provide Assistance: (SNF)  Type of Caregiver Assistance: (SNF)  Recommendation:  24 hour supervision/assistance;Skilled Nursing facility  Rationale for SLP Follow Up: Maximize cognitive function and independence;Reduce caregiver burden   Equipment: none recommended by SLP   Reasons for discharge: Discharged  from hospital   Patient/Family Agrees with Progress Made and Goals Achieved: Yes   Function:  Eating Eating                 Cognition Comprehension Comprehension assist level: Understands basic 75 - 89% of the time/ requires cueing 10 - 24% of the time  Expression   Expression assist level: Expresses basic 75 - 89% of the time/requires cueing 10 - 24% of the time. Needs helper to occlude trach/needs to repeat words.  Social Interaction Social Interaction assist level: Interacts appropriately 25 - 49% of time - Needs frequent redirection.  Problem Solving Problem solving assist level: Solves basic 25 - 49% of the time - needs direction more than half the time to initiate, plan or complete simple activities  Memory Memory assist level: Recognizes or recalls 25 - 49% of the time/requires cueing 50 - 75% of the time   Emilio Math 07/04/2017, 12:20 PM

## 2017-07-04 NOTE — Progress Notes (Signed)
Social Work Patient ID: [name], female   DOB: 1945-07-07, 72 y.o.   MRN: [id number]   [facility] have offered a NH bed and pt is in agreement and will be glad to go tomorrow. Contacted Kenneth-boyfriend and Joel-son to inform of plan. Team aware and will work on discharge for tomorrow.

## 2017-07-04 NOTE — Progress Notes (Signed)
Physical Therapy Session Note  Patient Details  Name: [name] MRN: [id number] Date of Birth: 19-Dec-1945  Today's Date: 07/04/2017 PT Individual Time: 1420-1530 PT Individual Time Calculation (min): 70 min   Short Term Goals: Week 1:  PT Short Term Goal 1 (Week 1): Pt will complete bed<>w/c transfers with mod assist +1. PT Short Term Goal 1 - Progress (Week 1): Met PT Short Term Goal 2 (Week 1): Pt will ambulate 50 ft with max assist +1 & LRAD. PT Short Term Goal 2 - Progress (Week 1): Not met PT Short Term Goal 3 (Week 1): Pt will initiate stair training.  PT Short Term Goal 3 - Progress (Week 1): Not met  Skilled Therapeutic Interventions/Progress Updates:    Pt received in w/c, finishing lunch with NT present, agreeable to therapy. Pt with no c/o pain and states she recently had Voltaren gel applied to R knee. At beginning of session, pt oriented to date and aware of discharge plans for tomorrow. Therapist propelled pt in w/c to gym for car transfer. Pt required multiple attempts for sit>stand with verbal cuing for sequencing and technique. Pt demonstrated increased posterior and L lateral lean during sit>stand 2/2 fear of falling. Pt able to complete sit>stand with RW and mod assist. Pt performed stand>pivot w/ RW to car with mod assist and pt was able to position BLE in car. Pt required verbal cuing for sequencing. Pt reports both boyfriend and son drive trucks, which she will be riding home in upon discharge. Pt transferred car> w/c stand pivot with RW and min assist and verbal cuing for hand placement. Pt states she is sad to be leaving the hospital because she has made friends here. Pt propelled w/c with supervision to apartment for bed mobility. Pt performs w/c<>bed stand pivot w/ RW and min assist. Pt able to place BLE on bed and roll with supervision. Pt began to seem fatigued and answered incorrectly when asked what day it is and states "I don't know" when asked where she is. Pt  able to answer correctly with verbal cuing. Therapist propelled pt in w/c to gym for stair negotiation and pt educated about sequencing and technique. Pt performed 1x 3'' stair with use of bilateral railings and mod assist. On descent, pt w/ LOB and landed on bilateral knees 2/2 knees giving out. Therapist x3 able to place pt in w/c total assist. Pt reports mild pain in R knee 2/2 fall but also notes frustration that knees gave out. Pt reassured about situation. Vitals were taken and RN alerted 2/2 LOB. BP was 133/69 mmHg, O2 98%, and HR 61-101 bpm. RN arrived to assess pt and cleared to finish therapy. Pt performed seated therex in w/c with orange theraband: Hip ABD, bicep curls x 20, hip flexion. Pt appeared to fall asleep during exercises but easily aroused with verbal cues. Therapist propelled pt in w/c back to room, and was left seated with quick release belt and chair alarm on. RN made aware of situation.       Therapy Documentation Precautions:  Precautions Precautions: Fall Precaution Comments: L hemi, fear of falling, L inattention  Restrictions Weight Bearing Restrictions: No    See Function Navigator for Current Functional Status.   Therapy/Group: Individual Therapy  Caffie Damme 07/04/2017, 5:10 PM

## 2017-07-04 NOTE — Progress Notes (Addendum)
Physical Therapy Discharge Summary  Patient Details  Name: [name] MRN: [id number] Date of Birth: July 20, 1945  Today's Date: 07/04/2017  Patient has met 8 of 9 long term goals due to improved balance, improved postural control, increased strength and improved coordination.  Patient to discharge at an ambulatory level [place] with RW but can required min-max assist for transfers. Pt can be limited by significant fatigue in the afternoons, as well as R knee buckling.  Reasons goals not met: Impaired cognition.   Recommendation:  Patient will benefit from ongoing skilled PT services in skilled nursing facility setting to continue to advance safe functional mobility, address ongoing impairments in cognition, balance, R knee pain & weakness, gait, stairs, transfers, strength, endurance, and minimize fall risk.  Equipment: has personal wheelchair from acute   Reasons for discharge: discharge from hospital  Patient/family agrees with progress made and goals achieved: Yes  PT Discharge Precautions/Restrictions Precautions Precautions: Fall Precaution Comments: L hemi, fear of falling, L inattention  Restrictions Weight Bearing Restrictions: No   Cognition: Impaired/different from baseline Oriented to person, orientation fluctuates during session  Awareness impaired, intellectual impairment Problem solving impaired, functional basic and verbal basic Safety/judgment impaired   Motor  Motor Motor: Hemiplegia Motor - Skilled Clinical Observations: L hemi, generalized weakness, RLE weakness & intermittent buckling Mobility Bed Mobility Bed Mobility: Rolling Right;Rolling Left;Supine to Sit;Sit to Supine Rolling Right: 5: Supervision Rolling Left: 5: Supervision Supine to Sit: 5: Supervision Supine to Sit Details: Verbal cues for sequencing;Verbal cues for technique Sit to Supine: 5: Supervision Transfers Transfers: Yes Sit to Stand: With armrests;4: Min assist Sit to  Stand Details: Verbal cues for sequencing;Verbal cues for technique Stand to Sit: 4: Min assist Stand to Sit Details (indicate cue type and reason): Verbal cues for sequencing;Verbal cues for technique Stand Pivot Transfers: 4: Min assist with RW Locomotion  Ambulation Ambulation: Yes Ambulation/Gait Assistance: 4: Min assist Ambulation Distance (Feet): 26 Feet Assistive device: Rolling walker Ambulation/Gait Assistance Details: Verbal cues for sequencing;Verbal cues for technique Gait Gait: Yes Gait Pattern: Impaired Gait Pattern: Decreased step length - right;Decreased step length - left;Shuffle Gait velocity: slow Stairs / Additional Locomotion Stairs: Yes Stairs Assistance: 3: Mod assist Stairs Assistance Details: Verbal cues for technique;Verbal cues for sequencing Stair Management Technique: Two rails Number of Stairs: 1 Height of Stairs: 3 inches Wheelchair Mobility Wheelchair Mobility: Yes Wheelchair Assistance: 5: Supervision x 75 ft Wheelchair Propulsion: Both upper extremities Wheelchair Parts Management: Needs assistance   Balance Dynamic Standing Balance Dynamic Standing - Balance Support: Bilateral upper extremity supported on RW Dynamic Standing - Level of Assistance: 4: Min assist Dynamic Standing - Balance Activities: (gait )   See Function Navigator for Current Functional Status.  Caffie Damme 07/04/2017, 5:25 PM

## 2017-07-04 NOTE — Progress Notes (Signed)
Occupational Therapy Session Note  Patient Details  Name: [name] MRN: [id number] Date of Birth: 05/28/1945  Today's Date: 07/04/2017 OT Individual Time: 0930-1045 OT Individual Time Calculation (min): 75 min    Short Term Goals: Week 2:  OT Short Term Goal 1 (Week 2): STG=LTG due to LOS  Skilled Therapeutic Interventions/Progress Updates:  Pt presented in w/c in room with no c/o pain, she explained she's having an off day and after receiving her medication she felt sick, nursing gave her soda and crackers. She agreed to ADL shower/dressing/grooming session. She was working on entering the dates of this week in her notebook and required Mod A to recall the sequencing of days. Pt gathered clean clothing found in her drawers at w/c level, she needed Mod VCs to locate all the clothing items due to fleeting attention to task. Sit to stand from w/c to RW Min A with Mod VCs for correct foot placement due to posterior lean when standing. Transfers to shower from RW Min A, during showering she had some perserveration with washing her hands and required Mod VCs to move on to next bathing task. Stand pivot transfer to w/c with use of grab bars was completed Mod A. Pt completed dressing in w/c in bedroom, education for energy conservation by dressing all LB at once, and education when standing to pull pants up one side at a time so RW can be used by one hand for balance. Due to unsuccessful attempts by pt she requiring Mod A with VCs for donning and tieing shoes. PT completed session standing at sink in RW to perform oral care and grooming task requiring supervsion. Pt was left in room with chair alarm on, table and all needs met.        Therapy Documentation Precautions:  Precautions Precautions: Fall Precaution Comments: L inattention, L hemi, very fearful of falling Restrictions Weight Bearing Restrictions: No    Therapy/Group: Individual Therapy    07/04/2017, 1:16 PM 

## 2017-07-04 NOTE — Progress Notes (Signed)
Subjective/Complaints:  No issues overnite  ROS:  Denies CP,SOB, no vomiting, no diarrhea  Objective: Vital Signs: Blood pressure (!) 151/89, pulse (!) 46, temperature (!) 97.4 F (36.3 C), temperature source Axillary, resp. rate 15, height '5\' 7"'  (1.702 m), weight 78.2 kg (172 lb 6.4 oz), SpO2 99 %. No results found. Results for orders placed or performed during the hospital encounter of 06/19/17 (from the past 72 hour(s))  Glucose, capillary     Status: Abnormal   Collection Time: 07/01/17 11:48 AM  Result Value Ref Range   Glucose-Capillary 188 (H) 65 - 99 mg/dL  Glucose, capillary     Status: Abnormal   Collection Time: 07/01/17  4:40 PM  Result Value Ref Range   Glucose-Capillary 200 (H) 65 - 99 mg/dL  Glucose, capillary     Status: Abnormal   Collection Time: 07/01/17  9:27 PM  Result Value Ref Range   Glucose-Capillary 122 (H) 65 - 99 mg/dL  Glucose, capillary     Status: Abnormal   Collection Time: 07/02/17  6:39 AM  Result Value Ref Range   Glucose-Capillary 103 (H) 65 - 99 mg/dL  Glucose, capillary     Status: Abnormal   Collection Time: 07/02/17 11:18 AM  Result Value Ref Range   Glucose-Capillary 218 (H) 65 - 99 mg/dL  Glucose, capillary     Status: Abnormal   Collection Time: 07/02/17  4:43 PM  Result Value Ref Range   Glucose-Capillary 216 (H) 65 - 99 mg/dL  Glucose, capillary     Status: Abnormal   Collection Time: 07/02/17  8:57 PM  Result Value Ref Range   Glucose-Capillary 183 (H) 65 - 99 mg/dL  Basic metabolic panel     Status: Abnormal   Collection Time: 07/03/17  5:58 AM  Result Value Ref Range   Sodium 144 135 - 145 mmol/L   Potassium 3.2 (L) 3.5 - 5.1 mmol/L   Chloride 108 101 - 111 mmol/L   CO2 25 22 - 32 mmol/L   Glucose, Bld 109 (H) 65 - 99 mg/dL   BUN 16 6 - 20 mg/dL   Creatinine, Ser 0.71 0.44 - 1.00 mg/dL   Calcium 9.4 8.9 - 10.3 mg/dL   GFR calc non Af Amer >60 >60 mL/min   GFR calc Af Amer >60 >60 mL/min    Comment: (NOTE) The eGFR  has been calculated using the CKD EPI equation. This calculation has not been validated in all clinical situations. eGFR's persistently <60 mL/min signify possible Chronic Kidney Disease.    Anion gap 11 5 - 15    Comment: Performed at [facility] [address] [place], [place] [address]  CBC     Status: Abnormal   Collection Time: 07/03/17  5:58 AM  Result Value Ref Range   WBC 8.4 4.0 - 10.5 K/uL   RBC 5.06 3.87 - 5.11 MIL/uL   Hemoglobin 11.9 (L) 12.0 - 15.0 g/dL   HCT 41.1 36.0 - 46.0 %   MCV 81.2 78.0 - 100.0 fL   MCH 23.5 (L) 26.0 - 34.0 pg   MCHC 29.0 (L) 30.0 - 36.0 g/dL   RDW 18.8 (H) 11.5 - 15.5 %   Platelets 277 150 - 400 K/uL    Comment: Performed at [facility], [place] [address], [place], [place] [address]  Glucose, capillary     Status: Abnormal   Collection Time: 07/03/17  6:26 AM  Result Value Ref Range   Glucose-Capillary 108 (H) 65 -  99 mg/dL  Glucose, capillary     Status: Abnormal   Collection Time: 07/03/17 11:31 AM  Result Value Ref Range   Glucose-Capillary 193 (H) 65 - 99 mg/dL  Glucose, capillary     Status: Abnormal   Collection Time: 07/03/17  4:41 PM  Result Value Ref Range   Glucose-Capillary 163 (H) 65 - 99 mg/dL  Glucose, capillary     Status: Abnormal   Collection Time: 07/03/17  8:36 PM  Result Value Ref Range   Glucose-Capillary 208 (H) 65 - 99 mg/dL  Glucose, capillary     Status: Abnormal   Collection Time: 07/04/17  6:34 AM  Result Value Ref Range   Glucose-Capillary 131 (H) 65 - 99 mg/dL     HEENT: normal Cardio: RRR and no murmur Resp: CTA B/L and unlabored GI: BS positive and Non Tender and Non distended Extremity:  No Edema Skin:   Intact Neuro: Lethargic, Normal Sensory, Normal Motor, Inattention and Other left homonymous hemianopsia Musc/Skel:  Other Ulnar drift bilateral MCPs, Bouchard's deformity Right index finger, RIgh tknee effusion with tenderness Gen nad   Assessment/Plan: 1. Functional deficits  secondary to Right PCA/post MCA infarct which require 3+ hours per day of interdisciplinary therapy in a comprehensive inpatient rehab setting. Physiatrist is providing close team supervision and 24 hour management of active medical problems listed below. Physiatrist and rehab team continue to assess barriers to discharge/monitor patient progress toward functional and medical goals. FIM: Function - Bathing Position: Shower Body parts bathed by patient: Right arm, Right lower leg, Left lower leg, Left arm, Chest, Abdomen, Front perineal area, Right upper leg, Left upper leg, Buttocks Body parts bathed by helper: Back Assist Level: Touching or steadying assistance(Pt > 75%)  Function- Upper Body Dressing/Undressing What is the patient wearing?: Pull over shirt/dress Pull over shirt/dress - Perfomed by patient: Thread/unthread right sleeve, Put head through opening, Pull shirt over trunk, Thread/unthread left sleeve Pull over shirt/dress - Perfomed by helper: Thread/unthread left sleeve Button up shirt - Perfomed by patient: Button/unbutton shirt Assist Level: Supervision or verbal cues Function - Lower Body Dressing/Undressing What is the patient wearing?: Maryln Manuel, Underwear, Shoes Position: Education officer, museum at [employer] - Performed by patient: Thread/unthread right underwear leg, Thread/unthread left underwear leg, Pull underwear up/down Underwear - Performed by helper: Pull underwear up/down(briefs (no clean underwear)) Pants- Performed by patient: Pull pants up/down, Thread/unthread right pants leg, Thread/unthread left pants leg Pants- Performed by helper: Thread/unthread left pants leg Non-skid slipper socks- Performed by patient: Don/doff right sock, Don/doff left sock Socks - Performed by patient: Don/doff right sock, Don/doff left sock Shoes - Performed by patient: Don/doff right shoe, Don/doff left shoe TED Hose - Performed by helper: Don/doff right TED hose, Don/doff left TED  hose Assist for footwear: Supervision/touching assist Assist for lower body dressing: Touching or steadying assistance (Pt > 75%)  Function - Toileting Toileting steps completed by patient: Adjust clothing prior to toileting, Performs perineal hygiene, Adjust clothing after toileting Toileting steps completed by helper: Adjust clothing prior to toileting, Performs perineal hygiene, Adjust clothing after toileting Toileting Assistive Devices: Grab bar or rail Assist level: Touching or steadying assistance (Pt.75%)  Function - Air cabin crew transfer assistive device: Grab bar Assist level to toilet: Touching or steadying assistance (Pt > 75%) Assist level from toilet: Touching or steadying assistance (Pt > 75%) Assist level to bedside commode (at bedside): Touching or steadying assistance (Pt > 75%) Assist level from bedside commode (at bedside): Touching or steadying assistance (  Pt > 75%)  Function - Chair/bed transfer Chair/bed transfer activity did not occur: Safety/medical concerns Chair/bed transfer method: Stand pivot Chair/bed transfer assist level: Touching or steadying assistance (Pt > 75%) Chair/bed transfer assistive device: Armrests Chair/bed transfer details: Tactile cues for sequencing, Tactile cues for weight shifting, Tactile cues for posture, Tactile cues for initiation, Tactile cues for placement, Verbal cues for technique, Manual facilitation for placement, Manual facilitation for weight shifting, Verbal cues for sequencing  Function - Locomotion: Wheelchair Wheelchair activity did not occur: Safety/medical concerns Max wheelchair distance: 150 ft Assist Level: Supervision or verbal cues Wheel 50 feet with 2 turns activity did not occur: Safety/medical concerns Assist Level: Supervision or verbal cues Wheel 150 feet activity did not occur: Safety/medical concerns Assist Level: Supervision or verbal cues Function - Locomotion: Ambulation Assistive device:  Walker-rolling Max distance: 25 ft Assist level: Touching or steadying assistance (Pt > 75%) Assist level: Touching or steadying assistance (Pt > 75%) Walk 50 feet with 2 turns activity did not occur: Safety/medical concerns Walk 150 feet activity did not occur: Safety/medical concerns Walk 10 feet on uneven surfaces activity did not occur: Safety/medical concerns  Function - Comprehension Comprehension: Auditory Comprehension assist level: Understands basic 75 - 89% of the time/ requires cueing 10 - 24% of the time, Understands basic 50 - 74% of the time/ requires cueing 25 - 49% of the time  Function - Expression Expression: Verbal Expression assist level: Expresses basic 75 - 89% of the time/requires cueing 10 - 24% of the time. Needs helper to occlude trach/needs to repeat words.  Function - Social Interaction Social Interaction assist level: Interacts appropriately 75 - 89% of the time - Needs redirection for appropriate language or to initiate interaction.  Function - Problem Solving Problem solving assist level: Solves basic 50 - 74% of the time/requires cueing 25 - 49% of the time  Function - Memory Memory assistive device: Memory book Memory assist level: Recognizes or recalls 50 - 74% of the time/requires cueing 25 - 49% of the time Patient normally able to recall (first 3 days only): That he or she is in a hospital, Current season, Staff names and faces, Location of own room  Medical Problem List and Plan: 1.Functional and visual-spatial deficitssecondary to right PCA/MCA infarct -CIR PT, OT, SLP ,team conf in am  2. DVT Prophylaxis/Anticoagulation: Contxarelto 87m /day, no bleeding complications plt normal 4/1 3.Chronic LBP/Right knee pain/Pain Management:tylenol prn and local measures. Continue to hold Norco and restart gabapentin for Sciatic pain 4. Mood:LCSW to follow for evaluation and support.Continue Wellbutrin bid 5. Neuropsych: This  patientis not fullycapable of making decisions onherown behalf. 6. Skin/Wound Care:routine pressure relief measures 7. Fluids/Electrolytes/Nutrition:Monitor I/O. BMET today  8. A fib: Monitor HR bid. Continue Metoprolol bid with Cardizem daily. .  Vitals:   07/03/17 2209 07/04/17 0135  BP: 100/80 (!) 151/89  Pulse: 71 (!) 46  Resp:  15  Temp:  (!) 97.4 F (36.3 C)  SpO2:  99%  labile  4/2 monitor pattern 9. RA: managed with Arava and low dose prednisone.  10. GERD: On Protonix.  11.Hgb A1C- 6.9: T2DM: Monitor BS ac/hs. Resume Amaryl as po intake is good--continue to hold metformin for now.  CBG (last 3)  Recent Labs    07/03/17 1641 07/03/17 2036 07/04/17 0634  GLUCAP 163* 208* 131*  Adequate control 4/2 12.  Hypoalb- add prostat 13.  Mild anemia may be related to RA , chronic disease, check Fe studies and stool guaic  LOS (Days) 15 A FACE TO FACE EVALUATION WAS PERFORMED  Charlett Blake 07/04/2017, 8:14 AM

## 2017-07-04 NOTE — Progress Notes (Signed)
Occupational Therapy Discharge Summary  Patient Details  Name: [name] MRN: [id number] Date of Birth: 01/27/1946  Patient has met 9 of 53 long term goals due to improved activity tolerance, improved balance, postural control, ability to compensate for deficits, functional use of  LEFT upper extremity, improved attention, improved awareness and improved coordination.  Patient to discharge at overall Min-mod Assist level.  Patient's care partner is unable to provided needed physical and cognitive assist at d/c. Therefore, pt will d/c to SNF in order to cont to gain independence with ADLs and reduce caregiver burden.    Reasons goals not met: Pt is very inconsistent with physical and cognitive performance. Pt's assist levels can fluctuate throughout the day and sometimes within session. She can require CGA- max A for functional transfers depending on fatigue level, attention, and perseverative tendencies including fear of falling, R knee pain, etc.  Pt requires min A for toileting and LB dressing at sit <> Stand level due to poor dynamic standing balance.   Recommendation:  Patient will benefit from ongoing skilled OT services in skilled nursing facility setting to continue to advance functional skills in the area of BADL and Reduce care partner burden.  Equipment: To be determined at next venue of care  Reasons for discharge: discharge from hospital  Patient/family agrees with progress made and goals achieved: Yes  OT Discharge Precautions/Restrictions  Precautions Precautions: Fall Precaution Comments: L hemi, fear of falling, L inattention  Restrictions Weight Bearing Restrictions: No Vision Baseline Vision/History: Wears glasses Wears Glasses: At all times Patient Visual Report: No change from baseline Vision Assessment?: Yes Eye Alignment: Impaired (comment) Ocular Range of Motion: Restricted on the left Alignment/Gaze Preference: Head tilt Tracking/Visual Pursuits: Right  eye does not track laterally;Left eye does not track laterally;Impaired - to be further tested in functional context;Unable to hold eye position out of midline Saccades: Impaired - to be further tested in functional context Convergence: Impaired (comment) Visual Fields: Left visual field deficit Perception  Perception: Impaired Inattention/Neglect: Does not attend to left visual field;Other (comment)(Pt learning and utilizing compensatory stratiegies within functional tasks) Praxis Praxis: Impaired Praxis Impairment Details: Perseveration;Motor planning Cognition Overall Cognitive Status: Impaired/Different from baseline Arousal/Alertness: Awake/alert Orientation Level: Oriented to person(Pt's orientation level fluctuates throughout the day and sometimes within a session. ) Attention: Sustained Focused Attention: Impaired Focused Attention Impairment: Functional basic;Verbal basic Sustained Attention: Impaired Sustained Attention Impairment: Verbal basic;Functional basic Selective Attention: Impaired Selective Attention Impairment: Verbal basic;Functional basic Memory: Impaired Memory Impairment: Storage deficit Decreased Short Term Memory: Verbal basic;Functional basic Awareness: Impaired Awareness Impairment: Intellectual impairment Problem Solving: Impaired Problem Solving Impairment: Functional basic;Verbal basic Executive Function: (All impaired due to lower level deficits) Sequencing: Impaired Sequencing Impairment: Verbal basic;Functional basic Organizing: Impaired Organizing Impairment: Verbal basic;Functional basic Initiating: Impaired Initiating Impairment: Functional basic Self Monitoring: Impaired Self Monitoring Impairment: Functional basic Self Correcting: Impaired Self Correcting Impairment: Functional basic Behaviors: Impulsive Safety/Judgment: Impaired Comments: left inattention Sensation Sensation Light Touch: Impaired Detail Light Touch Impaired  Details: Impaired LUE Stereognosis: Appears Intact Hot/Cold: Appears Intact Proprioception: Impaired Detail Proprioception Impaired Details: Impaired LUE Coordination Gross Motor Movements are Fluid and Coordinated: No Fine Motor Movements are Fluid and Coordinated: No Coordination and Movement Description: Guarded movements due to extreme fear of falling Finger Nose Finger Test: Decreased speed and accuracy 2/2 decreased attention. Unable to locate target L of midline Motor  Motor Motor: Hemiplegia Motor - Skilled Clinical Observations: L hemi, generalized weakness Motor - Discharge Observations: L hemiplegia Mobility  Bed Mobility Bed Mobility: Rolling Right;Rolling Left;Supine to Sit;Sit to Supine Rolling Right: 5: Supervision Rolling Left: 5: Supervision Supine to Sit: 5: Supervision Supine to Sit Details: Verbal cues for sequencing;Verbal cues for technique Sit to Supine: 5: Supervision Transfers Sit to Stand: With armrests;4: Min assist Sit to Stand Details: Verbal cues for sequencing;Verbal cues for technique Stand to Sit: 4: Min assist Stand to Sit Details (indicate cue type and reason): Verbal cues for sequencing;Verbal cues for technique  Trunk/Postural Assessment  Cervical Assessment Cervical Assessment: Exceptions to WFL(Forward head) Thoracic Assessment Thoracic Assessment: Exceptions to WFL(Rounded shoulders; kyphotic) Lumbar Assessment Lumbar Assessment: Exceptions to WFL(Posterior pelvic tilt) Postural Control Postural Control: Deficits on evaluation(Posterior bias)  Balance Balance Balance Assessed: Yes Static Sitting Balance Static Sitting - Balance Support: Feet supported Static Sitting - Level of Assistance: 6: Modified independent (Device/Increase time);5: Stand by assistance Dynamic Sitting Balance Dynamic Sitting - Balance Support: During functional activity;Feet supported Dynamic Sitting - Level of Assistance: 5: Stand by assistance Sitting  balance - Comments: During bathing/dressing tasks Static Standing Balance Static Standing - Balance Support: During functional activity;Right upper extremity supported;Left upper extremity supported Static Standing - Level of Assistance: 5: Stand by assistance;4: Min assist Dynamic Standing Balance Dynamic Standing - Balance Support: During functional activity;Right upper extremity supported;Left upper extremity supported Dynamic Standing - Level of Assistance: 5: Stand by assistance;4: Min assist Dynamic Standing - Balance Activities: (gait ) Dynamic Standing - Comments: Standing to compete LB bathing/dressing Extremity/Trunk Assessment RUE Assessment RUE Assessment: Within Functional Limits(Strength WFL for tasks assessed, not formally assessed. RA in all joints of hand) LUE Assessment LUE Assessment: (Strength WFL for tasks assessed, not formally assessed. RA in all joints of hand)   See Function Navigator for Current Functional Status.  Jessup Ogas L 07/04/2017, 6:25 PM

## 2017-07-04 NOTE — Progress Notes (Signed)
   07/04/17 1600  What Happened  Was fall witnessed? Yes  Who witnessed fall? Lavone Nian, PT  Patients activity before fall ambulating-assisted;during therapy  Point of contact other (comment) (knees)  Was patient injured? No  Follow Up  MD notified Reesa Chew, PA  Time MD notified 1600  Additional tests No  Adult Fall Risk Assessment  Risk Factor Category (scoring not indicated) Fall has occurred during this admission (document High fall risk)  Patient's Fall Risk High Fall Risk (>13 points)  Adult Fall Risk Interventions  Required Bundle Interventions *See Row Information* High fall risk - low, moderate, and high requirements implemented  Additional Interventions Camera surveillance (with patient/family notification & education);Lap belt while in chair/wheelchair  Screening for Fall Injury Risk (To be completed on HIGH fall risk patients) - Assessing Need for Low Bed  Risk For Fall Injury- Low Bed Criteria None identified - Continue screening  Screening for Fall Injury Risk (To be completed on HIGH fall risk patients who do not meet crieteria for Low Bed) - Assessing Need for Floor Mats Only  Risk For Fall Injury- Criteria for Floor Mats Bleeding risk-anticoagulation (not prophylaxis)  Will Implement Floor Mats Yes  Video Monitoring (Completed by VMT Department Only)  # of Interactions/hr No interaction needed  STAT Alarm Activated  No     07/04/17 1600  What Happened  Was fall witnessed? Yes  Who witnessed fall? Lavone Nian, PT  Patients activity before fall ambulating-assisted;during therapy  Point of contact other (comment) (knees)  Was patient injured? No  Follow Up  MD notified Reesa Chew, PA  Time MD notified 1600  Additional tests No  Adult Fall Risk Assessment  Risk Factor Category (scoring not indicated) Fall has occurred during this admission (document High fall risk)  Patient's Fall Risk High Fall Risk (>13 points)  Adult Fall Risk Interventions   Required Bundle Interventions *See Row Information* High fall risk - low, moderate, and high requirements implemented  Additional Interventions Camera surveillance (with patient/family notification & education);Lap belt while in chair/wheelchair  Screening for Fall Injury Risk (To be completed on HIGH fall risk patients) - Assessing Need for Low Bed  Risk For Fall Injury- Low Bed Criteria None identified - Continue screening  Screening for Fall Injury Risk (To be completed on HIGH fall risk patients who do not meet crieteria for Low Bed) - Assessing Need for Floor Mats Only  Risk For Fall Injury- Criteria for Floor Mats Bleeding risk-anticoagulation (not prophylaxis)  Will Implement Floor Mats Yes  Video Monitoring (Completed by VMT Department Only)  # of Interactions/hr No interaction needed  STAT Alarm Activated  No

## 2017-07-05 ENCOUNTER — Inpatient Hospital Stay (HOSPITAL_COMMUNITY): Payer: Medicare Other | Admitting: Occupational Therapy

## 2017-07-05 DIAGNOSIS — M4807 Spinal stenosis, lumbosacral region: Secondary | ICD-10-CM | POA: Diagnosis not present

## 2017-07-05 DIAGNOSIS — I5033 Acute on chronic diastolic (congestive) heart failure: Secondary | ICD-10-CM | POA: Diagnosis present

## 2017-07-05 DIAGNOSIS — G9340 Encephalopathy, unspecified: Secondary | ICD-10-CM | POA: Diagnosis present

## 2017-07-05 DIAGNOSIS — R441 Visual hallucinations: Secondary | ICD-10-CM | POA: Diagnosis present

## 2017-07-05 DIAGNOSIS — Z7901 Long term (current) use of anticoagulants: Secondary | ICD-10-CM | POA: Diagnosis not present

## 2017-07-05 DIAGNOSIS — Z86718 Personal history of other venous thrombosis and embolism: Secondary | ICD-10-CM | POA: Diagnosis not present

## 2017-07-05 DIAGNOSIS — M1610 Unilateral primary osteoarthritis, unspecified hip: Secondary | ICD-10-CM | POA: Diagnosis not present

## 2017-07-05 DIAGNOSIS — R414 Neurologic neglect syndrome: Secondary | ICD-10-CM | POA: Diagnosis not present

## 2017-07-05 DIAGNOSIS — E041 Nontoxic single thyroid nodule: Secondary | ICD-10-CM | POA: Diagnosis not present

## 2017-07-05 DIAGNOSIS — R2681 Unsteadiness on feet: Secondary | ICD-10-CM | POA: Diagnosis not present

## 2017-07-05 DIAGNOSIS — I739 Peripheral vascular disease, unspecified: Secondary | ICD-10-CM | POA: Diagnosis not present

## 2017-07-05 DIAGNOSIS — M6281 Muscle weakness (generalized): Secondary | ICD-10-CM | POA: Diagnosis not present

## 2017-07-05 DIAGNOSIS — R41841 Cognitive communication deficit: Secondary | ICD-10-CM | POA: Diagnosis not present

## 2017-07-05 DIAGNOSIS — R278 Other lack of coordination: Secondary | ICD-10-CM | POA: Diagnosis not present

## 2017-07-05 DIAGNOSIS — E1122 Type 2 diabetes mellitus with diabetic chronic kidney disease: Secondary | ICD-10-CM | POA: Diagnosis present

## 2017-07-05 DIAGNOSIS — H53462 Homonymous bilateral field defects, left side: Secondary | ICD-10-CM | POA: Diagnosis not present

## 2017-07-05 DIAGNOSIS — I5032 Chronic diastolic (congestive) heart failure: Secondary | ICD-10-CM | POA: Diagnosis not present

## 2017-07-05 DIAGNOSIS — R269 Unspecified abnormalities of gait and mobility: Secondary | ICD-10-CM | POA: Diagnosis not present

## 2017-07-05 DIAGNOSIS — R0789 Other chest pain: Secondary | ICD-10-CM | POA: Diagnosis not present

## 2017-07-05 DIAGNOSIS — I69398 Other sequelae of cerebral infarction: Secondary | ICD-10-CM | POA: Diagnosis not present

## 2017-07-05 DIAGNOSIS — Z7984 Long term (current) use of oral hypoglycemic drugs: Secondary | ICD-10-CM | POA: Diagnosis not present

## 2017-07-05 DIAGNOSIS — D631 Anemia in chronic kidney disease: Secondary | ICD-10-CM | POA: Diagnosis present

## 2017-07-05 DIAGNOSIS — E860 Dehydration: Secondary | ICD-10-CM | POA: Diagnosis present

## 2017-07-05 DIAGNOSIS — I4891 Unspecified atrial fibrillation: Secondary | ICD-10-CM | POA: Diagnosis not present

## 2017-07-05 DIAGNOSIS — I130 Hypertensive heart and chronic kidney disease with heart failure and stage 1 through stage 4 chronic kidney disease, or unspecified chronic kidney disease: Secondary | ICD-10-CM | POA: Diagnosis present

## 2017-07-05 DIAGNOSIS — I340 Nonrheumatic mitral (valve) insufficiency: Secondary | ICD-10-CM | POA: Diagnosis not present

## 2017-07-05 DIAGNOSIS — I639 Cerebral infarction, unspecified: Secondary | ICD-10-CM | POA: Diagnosis not present

## 2017-07-05 DIAGNOSIS — E1169 Type 2 diabetes mellitus with other specified complication: Secondary | ICD-10-CM | POA: Diagnosis not present

## 2017-07-05 DIAGNOSIS — I482 Chronic atrial fibrillation: Secondary | ICD-10-CM | POA: Diagnosis present

## 2017-07-05 DIAGNOSIS — M79671 Pain in right foot: Secondary | ICD-10-CM | POA: Diagnosis not present

## 2017-07-05 DIAGNOSIS — R072 Precordial pain: Secondary | ICD-10-CM | POA: Diagnosis not present

## 2017-07-05 DIAGNOSIS — E785 Hyperlipidemia, unspecified: Secondary | ICD-10-CM | POA: Diagnosis present

## 2017-07-05 DIAGNOSIS — Z8673 Personal history of transient ischemic attack (TIA), and cerebral infarction without residual deficits: Secondary | ICD-10-CM | POA: Diagnosis not present

## 2017-07-05 DIAGNOSIS — E11649 Type 2 diabetes mellitus with hypoglycemia without coma: Secondary | ICD-10-CM | POA: Diagnosis present

## 2017-07-05 DIAGNOSIS — M069 Rheumatoid arthritis, unspecified: Secondary | ICD-10-CM | POA: Diagnosis not present

## 2017-07-05 DIAGNOSIS — E876 Hypokalemia: Secondary | ICD-10-CM | POA: Diagnosis present

## 2017-07-05 DIAGNOSIS — F419 Anxiety disorder, unspecified: Secondary | ICD-10-CM | POA: Diagnosis not present

## 2017-07-05 DIAGNOSIS — E559 Vitamin D deficiency, unspecified: Secondary | ICD-10-CM | POA: Diagnosis not present

## 2017-07-05 DIAGNOSIS — F0150 Vascular dementia without behavioral disturbance: Secondary | ICD-10-CM | POA: Diagnosis present

## 2017-07-05 DIAGNOSIS — R443 Hallucinations, unspecified: Secondary | ICD-10-CM | POA: Diagnosis not present

## 2017-07-05 DIAGNOSIS — R002 Palpitations: Secondary | ICD-10-CM | POA: Diagnosis not present

## 2017-07-05 DIAGNOSIS — N179 Acute kidney failure, unspecified: Secondary | ICD-10-CM | POA: Diagnosis present

## 2017-07-05 DIAGNOSIS — Z96643 Presence of artificial hip joint, bilateral: Secondary | ICD-10-CM | POA: Diagnosis present

## 2017-07-05 DIAGNOSIS — I361 Nonrheumatic tricuspid (valve) insufficiency: Secondary | ICD-10-CM | POA: Diagnosis not present

## 2017-07-05 DIAGNOSIS — I480 Paroxysmal atrial fibrillation: Secondary | ICD-10-CM | POA: Diagnosis not present

## 2017-07-05 DIAGNOSIS — G464 Cerebellar stroke syndrome: Secondary | ICD-10-CM | POA: Diagnosis not present

## 2017-07-05 DIAGNOSIS — Z823 Family history of stroke: Secondary | ICD-10-CM | POA: Diagnosis not present

## 2017-07-05 DIAGNOSIS — G8190 Hemiplegia, unspecified affecting unspecified side: Secondary | ICD-10-CM | POA: Diagnosis not present

## 2017-07-05 DIAGNOSIS — E1151 Type 2 diabetes mellitus with diabetic peripheral angiopathy without gangrene: Secondary | ICD-10-CM | POA: Diagnosis present

## 2017-07-05 DIAGNOSIS — R4182 Altered mental status, unspecified: Secondary | ICD-10-CM | POA: Diagnosis not present

## 2017-07-05 DIAGNOSIS — K219 Gastro-esophageal reflux disease without esophagitis: Secondary | ICD-10-CM | POA: Diagnosis present

## 2017-07-05 DIAGNOSIS — F329 Major depressive disorder, single episode, unspecified: Secondary | ICD-10-CM | POA: Diagnosis present

## 2017-07-05 DIAGNOSIS — R945 Abnormal results of liver function studies: Secondary | ICD-10-CM | POA: Diagnosis not present

## 2017-07-05 DIAGNOSIS — K5900 Constipation, unspecified: Secondary | ICD-10-CM | POA: Diagnosis not present

## 2017-07-05 DIAGNOSIS — R748 Abnormal levels of other serum enzymes: Secondary | ICD-10-CM | POA: Diagnosis not present

## 2017-07-05 DIAGNOSIS — N183 Chronic kidney disease, stage 3 (moderate): Secondary | ICD-10-CM | POA: Diagnosis not present

## 2017-07-05 DIAGNOSIS — R0602 Shortness of breath: Secondary | ICD-10-CM | POA: Diagnosis not present

## 2017-07-05 DIAGNOSIS — R440 Auditory hallucinations: Secondary | ICD-10-CM | POA: Diagnosis present

## 2017-07-05 DIAGNOSIS — E7800 Pure hypercholesterolemia, unspecified: Secondary | ICD-10-CM | POA: Diagnosis present

## 2017-07-05 DIAGNOSIS — D509 Iron deficiency anemia, unspecified: Secondary | ICD-10-CM | POA: Diagnosis not present

## 2017-07-05 LAB — GLUCOSE, CAPILLARY: GLUCOSE-CAPILLARY: 130 mg/dL — AB (ref 65–99)

## 2017-07-05 NOTE — Progress Notes (Signed)
I called [facility] and gave report,patient will go by ambulance.

## 2017-07-05 NOTE — Progress Notes (Addendum)
Subjective/Complaints:  No issues overnite  ROS:  Denies CP,SOB, no vomiting, no diarrhea  Objective: Vital Signs: Blood pressure 104/84, pulse 72, temperature 98.1 F (36.7 C), temperature source Oral, resp. rate 17, height '5\' 7"'  (1.702 m), weight 78.3 kg (172 lb 9.9 oz), SpO2 96 %. No results found. Results for orders placed or performed during the hospital encounter of 06/19/17 (from the past 72 hour(s))  Glucose, capillary     Status: Abnormal   Collection Time: 07/02/17 11:18 AM  Result Value Ref Range   Glucose-Capillary 218 (H) 65 - 99 mg/dL  Glucose, capillary     Status: Abnormal   Collection Time: 07/02/17  4:43 PM  Result Value Ref Range   Glucose-Capillary 216 (H) 65 - 99 mg/dL  Glucose, capillary     Status: Abnormal   Collection Time: 07/02/17  8:57 PM  Result Value Ref Range   Glucose-Capillary 183 (H) 65 - 99 mg/dL  Basic metabolic panel     Status: Abnormal   Collection Time: 07/03/17  5:58 AM  Result Value Ref Range   Sodium 144 135 - 145 mmol/L   Potassium 3.2 (L) 3.5 - 5.1 mmol/L   Chloride 108 101 - 111 mmol/L   CO2 25 22 - 32 mmol/L   Glucose, Bld 109 (H) 65 - 99 mg/dL   BUN 16 6 - 20 mg/dL   Creatinine, Ser 0.71 0.44 - 1.00 mg/dL   Calcium 9.4 8.9 - 10.3 mg/dL   GFR calc non Af Amer >60 >60 mL/min   GFR calc Af Amer >60 >60 mL/min    Comment: (NOTE) The eGFR has been calculated using the CKD EPI equation. This calculation has not been validated in all clinical situations. eGFR's persistently <60 mL/min signify possible Chronic Kidney Disease.    Anion gap 11 5 - 15    Comment: Performed at [facility] [address] [place], [place] [address]  CBC     Status: Abnormal   Collection Time: 07/03/17  5:58 AM  Result Value Ref Range   WBC 8.4 4.0 - 10.5 K/uL   RBC 5.06 3.87 - 5.11 MIL/uL   Hemoglobin 11.9 (L) 12.0 - 15.0 g/dL   HCT 41.1 36.0 - 46.0 %   MCV 81.2 78.0 - 100.0 fL   MCH 23.5 (L) 26.0 - 34.0 pg   MCHC 29.0 (L) 30.0 - 36.0  g/dL   RDW 18.8 (H) 11.5 - 15.5 %   Platelets 277 150 - 400 K/uL    Comment: Performed at [facility], [place] [address] [place], [place] [address]  Glucose, capillary     Status: Abnormal   Collection Time: 07/03/17  6:26 AM  Result Value Ref Range   Glucose-Capillary 108 (H) 65 - 99 mg/dL  Glucose, capillary     Status: Abnormal   Collection Time: 07/03/17 11:31 AM  Result Value Ref Range   Glucose-Capillary 193 (H) 65 - 99 mg/dL  Glucose, capillary     Status: Abnormal   Collection Time: 07/03/17  4:41 PM  Result Value Ref Range   Glucose-Capillary 163 (H) 65 - 99 mg/dL  Glucose, capillary     Status: Abnormal   Collection Time: 07/03/17  8:36 PM  Result Value Ref Range   Glucose-Capillary 208 (H) 65 - 99 mg/dL  Glucose, capillary     Status: Abnormal   Collection Time: 07/04/17  6:34 AM  Result Value Ref Range   Glucose-Capillary 131 (H) 65 - 99 mg/dL  Iron     Status: None   Collection Time: 07/04/17  8:54 AM  Result Value Ref Range   Iron 31 28 - 170 ug/dL    Comment: Performed at [facility], [place] [address], [place], [place] [address]  Ferritin     Status: None   Collection Time: 07/04/17  8:54 AM  Result Value Ref Range   Ferritin 27 11 - 307 ng/mL    Comment: Performed at [facility], [place] [address], [place], [place] [address]  Glucose, capillary     Status: Abnormal   Collection Time: 07/04/17 12:11 PM  Result Value Ref Range   Glucose-Capillary 162 (H) 65 - 99 mg/dL  Glucose, capillary     Status: Abnormal   Collection Time: 07/04/17  4:48 PM  Result Value Ref Range   Glucose-Capillary 252 (H) 65 - 99 mg/dL  Glucose, capillary     Status: Abnormal   Collection Time: 07/04/17  9:36 PM  Result Value Ref Range   Glucose-Capillary 161 (H) 65 - 99 mg/dL  Glucose, capillary     Status: Abnormal   Collection Time: 07/05/17  6:21 AM  Result Value Ref Range   Glucose-Capillary 130 (H) 65 - 99 mg/dL     HEENT: normal Cardio: RRR and no  murmur Resp: CTA B/L and unlabored GI: BS positive and Non Tender and Non distended Extremity:  No Edema Skin:   Intact Neuro: Lethargic, Normal Sensory, Normal Motor, Inattention and Other left homonymous hemianopsia Musc/Skel:  Other Ulnar drift bilateral MCPs, Bouchard's deformity Right index finger, RIgh tknee effusion with tenderness Gen nad   Assessment/Plan: 1. Functional deficits secondary to Right PCA/post MCA infarct  Stable for D/C today F/u PCP in 3-4 weeks F/u PM&R 2 weeks See D/C summary See D/C instructions FIM: Function - Bathing Position: Shower Body parts bathed by patient: Right arm, Right lower leg, Left lower leg, Left arm, Chest, Abdomen, Front perineal area, Right upper leg, Left upper leg, Buttocks, Back Body parts bathed by helper: Back Assist Level: Supervision or verbal cues  Function- Upper Body Dressing/Undressing What is the patient wearing?: Pull over shirt/dress Pull over shirt/dress - Perfomed by patient: Thread/unthread right sleeve, Put head through opening, Pull shirt over trunk, Thread/unthread left sleeve Pull over shirt/dress - Perfomed by helper: Thread/unthread left sleeve Button up shirt - Perfomed by patient: Button/unbutton shirt Assist Level: Supervision or verbal cues Function - Lower Body Dressing/Undressing What is the patient wearing?: Socks, Shoes, Pants, Underwear Position: Administrator, [employer]) Underwear - Performed by patient: Thread/unthread right underwear leg, Thread/unthread left underwear leg, Pull underwear up/down Underwear - Performed by helper: Pull underwear up/down(briefs (no clean underwear)) Pants- Performed by patient: Pull pants up/down, Thread/unthread right pants leg, Thread/unthread left pants leg Pants- Performed by helper: Thread/unthread left pants leg Non-skid slipper socks- Performed by patient: Don/doff right sock, Don/doff left sock Socks - Performed by patient: Don/doff right sock, Don/doff left sock Shoes -  Performed by patient: Don/doff right shoe, Don/doff left shoe TED Hose - Performed by helper: Don/doff right TED hose, Don/doff left TED hose Assist for footwear: Partial/moderate assist Assist for lower body dressing: Touching or steadying assistance (Pt > 75%)  Function - Toileting Toileting steps completed by patient: Adjust clothing prior to toileting, Performs perineal hygiene, Adjust clothing after toileting Toileting steps completed by helper: Adjust clothing prior to toileting, Performs perineal hygiene, Adjust clothing after toileting Toileting Assistive Devices: Grab bar or rail Assist level: Touching or steadying assistance (Pt.75%)  Function - Air cabin crew transfer assistive device: Grab bar, Elevated toilet seat/BSC over toilet, Walker Assist level to toilet: Touching or steadying assistance (Pt > 75%) Assist level from toilet: Touching or steadying assistance (Pt > 75%) Assist level to bedside commode (at bedside): Touching or steadying assistance (Pt > 75%) Assist level from bedside commode (at bedside): Touching or steadying assistance (Pt > 75%)  Function - Chair/bed transfer Chair/bed transfer activity did not occur: Safety/medical concerns Chair/bed transfer method: Stand pivot Chair/bed transfer assist level: Touching or steadying assistance (Pt > 75%) Chair/bed transfer assistive device: Walker, Armrests Chair/bed transfer details: Tactile cues for sequencing, Tactile cues for weight shifting, Tactile cues for posture, Tactile cues for initiation, Tactile cues for placement, Verbal cues for technique, Manual facilitation for placement, Manual facilitation for weight shifting, Verbal cues for sequencing  Function - Locomotion: Wheelchair Will patient use wheelchair at discharge?: No Type: Manual Wheelchair activity did not occur: Safety/medical concerns Max wheelchair distance: 75 ft Assist Level: Supervision or verbal cues Wheel 50 feet with 2 turns  activity did not occur: Safety/medical concerns Assist Level: Supervision or verbal cues Wheel 150 feet activity did not occur: Safety/medical concerns Assist Level: Supervision or verbal cues Turns around,maneuvers to table,bed, and toilet,negotiates 3% grade,maneuvers on rugs and over doorsills: No Function - Locomotion: Ambulation Ambulation activity did not occur: Safety/medical concerns Assistive device: Walker-rolling Max distance: 25 ft Assist level: Touching or steadying assistance (Pt > 75%) Walk 10 feet activity did not occur: Safety/medical concerns Assist level: Touching or steadying assistance (Pt > 75%) Walk 50 feet with 2 turns activity did not occur: Safety/medical concerns Walk 150 feet activity did not occur: Safety/medical concerns Walk 10 feet on uneven surfaces activity did not occur: Safety/medical concerns  Function - Comprehension Comprehension: Auditory Comprehension assist level: Understands basic 75 - 89% of the time/ requires cueing 10 - 24% of the time  Function - Expression Expression: Verbal Expression assist level: Expresses basic 75 - 89% of the time/requires cueing 10 - 24% of the time. Needs helper to occlude trach/needs to repeat words.  Function - Social Interaction Social Interaction assist level: Interacts appropriately 75 - 89% of the time - Needs redirection for appropriate language or to initiate interaction.  Function - Problem Solving Problem solving assist level: Solves basic 25 - 49% of the time - needs direction more than half the time to initiate, plan or complete simple activities  Function - Memory Memory assistive device: Memory book Memory assist level: Recognizes or recalls 25 - 49% of the time/requires cueing 50 - 75% of the time Patient normally able to recall (first 3 days only): That he or she is in a hospital, Current season, Staff names and faces, Location of own room  Medical Problem List and Plan: 1.Functional and  visual-spatial deficitssecondary to right PCA/MCA infarct -CIR PT, OT, SLP , D/C to SNF today 2. DVT Prophylaxis/Anticoagulation: Contxarelto 92m /day, no bleeding complications plt normal 4/1 3.Chronic LBP/Right knee pain/Pain Management:tylenol prn and local measures. Continue to hold Norco and restart gabapentin for Sciatic pain 4. Mood:LCSW to follow for evaluation and support.Continue Wellbutrin bid 5. Neuropsych: This patientis not fullycapable of making decisions onherown behalf. 6. Skin/Wound Care:routine pressure relief measures 7. Fluids/Electrolytes/Nutrition:Monitor I/O. BMET 4/1 normal except hypoK 8. A fib: Monitor HR bid. Continue Metoprolol bid with Cardizem daily. .  Vitals:   07/04/17 2055 07/05/17 0051  BP: (!) 143/102 104/84  Pulse: 83 72  Resp:  17  Temp:  98.1 F (  36.7 C)  SpO2:  96%  labile  4/2 monitor pattern  9. RA: managed with Arava and low dose prednisone.  10. GERD: On Protonix.  11.Hgb A1C- 6.9: T2DM: Monitor BS ac/hs. Resume Amaryl as po intake is good--continue to hold metformin for now.  CBG (last 3)  Recent Labs    07/04/17 1648 07/04/17 2136 07/05/17 0621  GLUCAP 252* 161* 130*  Adequate control 4/2 12.  Hypoalb- add prostat 13.  Mild anemia may be related to RA , chronic disease, check Fe studies and stool guaic 14.  HypoK on KCL 33mq daily LOS (Days) 16 A FACE TO FACE EVALUATION WAS PERFORMED  ACharlett Blake4/06/2017, 8:27 AM

## 2017-07-05 NOTE — Progress Notes (Signed)
Social Work  Discharge Note  The overall goal for the admission was met for:   Discharge location: Yes-[facility]  Length of Stay: Yes-16 DAYS  Discharge activity level: Yes-MIN ASSIST LEVEL  Home/community participation: Yes  Services provided included: MD, RD, PT, OT, SLP, RN, CM, TR, Pharmacy, Neuropsych and SW  Financial Services: Medicare and Medicaid  Follow-up services arranged: Other: NHP  Comments (or additional information):PT NEEDS TO BE HIGHER LEVEL BEFORE GOING HOME WITH BOYFRIEND  Patient/Family verbalized understanding of follow-up arrangements: Yes  Individual responsible for coordination of the follow-up plan: JOEL-SON  Confirmed correct DME delivered: Elease Hashimoto 07/05/2017    Elease Hashimoto

## 2017-07-06 ENCOUNTER — Inpatient Hospital Stay (HOSPITAL_COMMUNITY): Payer: Medicare Other | Admitting: Physical Therapy

## 2017-07-06 DIAGNOSIS — I69398 Other sequelae of cerebral infarction: Secondary | ICD-10-CM | POA: Diagnosis not present

## 2017-07-06 DIAGNOSIS — Z7901 Long term (current) use of anticoagulants: Secondary | ICD-10-CM | POA: Diagnosis not present

## 2017-07-06 DIAGNOSIS — I480 Paroxysmal atrial fibrillation: Secondary | ICD-10-CM | POA: Diagnosis not present

## 2017-07-24 ENCOUNTER — Inpatient Hospital Stay (HOSPITAL_COMMUNITY)
Admission: EM | Admit: 2017-07-24 | Discharge: 2017-07-27 | DRG: 308 | Disposition: A | Discharge status: Home / Self Care | Payer: Medicare Other | Attending: Family Medicine | Admitting: Family Medicine

## 2017-07-24 ENCOUNTER — Inpatient Hospital Stay (HOSPITAL_COMMUNITY): Payer: Medicare Other

## 2017-07-24 ENCOUNTER — Emergency Department (HOSPITAL_COMMUNITY): Payer: Medicare Other

## 2017-07-24 ENCOUNTER — Encounter (HOSPITAL_COMMUNITY): Payer: Self-pay | Admitting: *Deleted

## 2017-07-24 ENCOUNTER — Other Ambulatory Visit: Payer: Self-pay

## 2017-07-24 DIAGNOSIS — N179 Acute kidney failure, unspecified: Secondary | ICD-10-CM | POA: Diagnosis not present

## 2017-07-24 DIAGNOSIS — I052 Rheumatic mitral stenosis with insufficiency: Secondary | ICD-10-CM | POA: Diagnosis present

## 2017-07-24 DIAGNOSIS — R002 Palpitations: Secondary | ICD-10-CM | POA: Diagnosis not present

## 2017-07-24 DIAGNOSIS — F329 Major depressive disorder, single episode, unspecified: Secondary | ICD-10-CM | POA: Diagnosis present

## 2017-07-24 DIAGNOSIS — R945 Abnormal results of liver function studies: Secondary | ICD-10-CM

## 2017-07-24 DIAGNOSIS — R4182 Altered mental status, unspecified: Secondary | ICD-10-CM | POA: Diagnosis not present

## 2017-07-24 DIAGNOSIS — E1151 Type 2 diabetes mellitus with diabetic peripheral angiopathy without gangrene: Secondary | ICD-10-CM | POA: Diagnosis present

## 2017-07-24 DIAGNOSIS — Z7901 Long term (current) use of anticoagulants: Secondary | ICD-10-CM

## 2017-07-24 DIAGNOSIS — E876 Hypokalemia: Secondary | ICD-10-CM | POA: Diagnosis present

## 2017-07-24 DIAGNOSIS — R441 Visual hallucinations: Secondary | ICD-10-CM | POA: Diagnosis present

## 2017-07-24 DIAGNOSIS — F419 Anxiety disorder, unspecified: Secondary | ICD-10-CM | POA: Diagnosis present

## 2017-07-24 DIAGNOSIS — I5033 Acute on chronic diastolic (congestive) heart failure: Secondary | ICD-10-CM

## 2017-07-24 DIAGNOSIS — D631 Anemia in chronic kidney disease: Secondary | ICD-10-CM | POA: Diagnosis present

## 2017-07-24 DIAGNOSIS — R440 Auditory hallucinations: Secondary | ICD-10-CM | POA: Diagnosis present

## 2017-07-24 DIAGNOSIS — R748 Abnormal levels of other serum enzymes: Secondary | ICD-10-CM | POA: Diagnosis not present

## 2017-07-24 DIAGNOSIS — E1122 Type 2 diabetes mellitus with diabetic chronic kidney disease: Secondary | ICD-10-CM | POA: Diagnosis present

## 2017-07-24 DIAGNOSIS — I4891 Unspecified atrial fibrillation: Secondary | ICD-10-CM

## 2017-07-24 DIAGNOSIS — R778 Other specified abnormalities of plasma proteins: Secondary | ICD-10-CM

## 2017-07-24 DIAGNOSIS — I272 Pulmonary hypertension, unspecified: Secondary | ICD-10-CM | POA: Diagnosis present

## 2017-07-24 DIAGNOSIS — R0602 Shortness of breath: Secondary | ICD-10-CM | POA: Diagnosis not present

## 2017-07-24 DIAGNOSIS — Z8673 Personal history of transient ischemic attack (TIA), and cerebral infarction without residual deficits: Secondary | ICD-10-CM | POA: Diagnosis not present

## 2017-07-24 DIAGNOSIS — Z7984 Long term (current) use of oral hypoglycemic drugs: Secondary | ICD-10-CM | POA: Diagnosis not present

## 2017-07-24 DIAGNOSIS — Z86718 Personal history of other venous thrombosis and embolism: Secondary | ICD-10-CM

## 2017-07-24 DIAGNOSIS — Z96643 Presence of artificial hip joint, bilateral: Secondary | ICD-10-CM | POA: Diagnosis present

## 2017-07-24 DIAGNOSIS — I361 Nonrheumatic tricuspid (valve) insufficiency: Secondary | ICD-10-CM | POA: Diagnosis not present

## 2017-07-24 DIAGNOSIS — N183 Chronic kidney disease, stage 3 (moderate): Secondary | ICD-10-CM | POA: Diagnosis present

## 2017-07-24 DIAGNOSIS — R197 Diarrhea, unspecified: Secondary | ICD-10-CM | POA: Diagnosis present

## 2017-07-24 DIAGNOSIS — Z823 Family history of stroke: Secondary | ICD-10-CM

## 2017-07-24 DIAGNOSIS — E860 Dehydration: Secondary | ICD-10-CM | POA: Diagnosis present

## 2017-07-24 DIAGNOSIS — E7800 Pure hypercholesterolemia, unspecified: Secondary | ICD-10-CM | POA: Diagnosis present

## 2017-07-24 DIAGNOSIS — E785 Hyperlipidemia, unspecified: Secondary | ICD-10-CM | POA: Diagnosis present

## 2017-07-24 DIAGNOSIS — Z9849 Cataract extraction status, unspecified eye: Secondary | ICD-10-CM

## 2017-07-24 DIAGNOSIS — I639 Cerebral infarction, unspecified: Secondary | ICD-10-CM | POA: Diagnosis not present

## 2017-07-24 DIAGNOSIS — E11649 Type 2 diabetes mellitus with hypoglycemia without coma: Secondary | ICD-10-CM | POA: Diagnosis present

## 2017-07-24 DIAGNOSIS — G9340 Encephalopathy, unspecified: Secondary | ICD-10-CM | POA: Diagnosis present

## 2017-07-24 DIAGNOSIS — I130 Hypertensive heart and chronic kidney disease with heart failure and stage 1 through stage 4 chronic kidney disease, or unspecified chronic kidney disease: Secondary | ICD-10-CM | POA: Diagnosis present

## 2017-07-24 DIAGNOSIS — I482 Chronic atrial fibrillation: Principal | ICD-10-CM | POA: Diagnosis present

## 2017-07-24 DIAGNOSIS — I5032 Chronic diastolic (congestive) heart failure: Secondary | ICD-10-CM | POA: Diagnosis not present

## 2017-07-24 DIAGNOSIS — Z79899 Other long term (current) drug therapy: Secondary | ICD-10-CM

## 2017-07-24 DIAGNOSIS — T502X5A Adverse effect of carbonic-anhydrase inhibitors, benzothiadiazides and other diuretics, initial encounter: Secondary | ICD-10-CM | POA: Diagnosis present

## 2017-07-24 DIAGNOSIS — R296 Repeated falls: Secondary | ICD-10-CM | POA: Diagnosis present

## 2017-07-24 DIAGNOSIS — F0150 Vascular dementia without behavioral disturbance: Secondary | ICD-10-CM | POA: Diagnosis present

## 2017-07-24 DIAGNOSIS — R443 Hallucinations, unspecified: Secondary | ICD-10-CM

## 2017-07-24 DIAGNOSIS — R072 Precordial pain: Secondary | ICD-10-CM | POA: Diagnosis not present

## 2017-07-24 DIAGNOSIS — E119 Type 2 diabetes mellitus without complications: Secondary | ICD-10-CM

## 2017-07-24 DIAGNOSIS — M069 Rheumatoid arthritis, unspecified: Secondary | ICD-10-CM | POA: Diagnosis present

## 2017-07-24 DIAGNOSIS — E1169 Type 2 diabetes mellitus with other specified complication: Secondary | ICD-10-CM

## 2017-07-24 DIAGNOSIS — R7989 Other specified abnormal findings of blood chemistry: Secondary | ICD-10-CM | POA: Diagnosis present

## 2017-07-24 DIAGNOSIS — K219 Gastro-esophageal reflux disease without esophagitis: Secondary | ICD-10-CM | POA: Diagnosis present

## 2017-07-24 DIAGNOSIS — D500 Iron deficiency anemia secondary to blood loss (chronic): Secondary | ICD-10-CM | POA: Diagnosis present

## 2017-07-24 DIAGNOSIS — R0789 Other chest pain: Secondary | ICD-10-CM | POA: Diagnosis not present

## 2017-07-24 DIAGNOSIS — Z7952 Long term (current) use of systemic steroids: Secondary | ICD-10-CM

## 2017-07-24 HISTORY — DX: Abnormal results of liver function studies: R94.5

## 2017-07-24 HISTORY — DX: Other specified abnormal findings of blood chemistry: R79.89

## 2017-07-24 HISTORY — DX: Unspecified osteoarthritis, unspecified site: M19.90

## 2017-07-24 HISTORY — DX: Cerebral infarction, unspecified: I63.9

## 2017-07-24 LAB — I-STAT CHEM 8, ED
BUN: 39 mg/dL — AB (ref 6–20)
CREATININE: 1.6 mg/dL — AB (ref 0.44–1.00)
Calcium, Ion: 0.83 mmol/L — CL (ref 1.15–1.40)
Chloride: 106 mmol/L (ref 101–111)
Glucose, Bld: 59 mg/dL — ABNORMAL LOW (ref 65–99)
HEMATOCRIT: 40 % (ref 36.0–46.0)
HEMOGLOBIN: 13.6 g/dL (ref 12.0–15.0)
POTASSIUM: 4.6 mmol/L (ref 3.5–5.1)
Sodium: 139 mmol/L (ref 135–145)
TCO2: 18 mmol/L — ABNORMAL LOW (ref 22–32)

## 2017-07-24 LAB — URINALYSIS, ROUTINE W REFLEX MICROSCOPIC
BACTERIA UA: NONE SEEN
Bilirubin Urine: NEGATIVE
Glucose, UA: NEGATIVE mg/dL
Ketones, ur: NEGATIVE mg/dL
Leukocytes, UA: NEGATIVE
NITRITE: NEGATIVE
PH: 5 (ref 5.0–8.0)
SPECIFIC GRAVITY, URINE: 1.018 (ref 1.005–1.030)

## 2017-07-24 LAB — COMPREHENSIVE METABOLIC PANEL
ALBUMIN: 3.1 g/dL — AB (ref 3.5–5.0)
ALT: 482 U/L — ABNORMAL HIGH (ref 14–54)
ANION GAP: 18 — AB (ref 5–15)
AST: 502 U/L — ABNORMAL HIGH (ref 15–41)
Alkaline Phosphatase: 132 U/L — ABNORMAL HIGH (ref 38–126)
BUN: 32 mg/dL — ABNORMAL HIGH (ref 6–20)
CO2: 14 mmol/L — ABNORMAL LOW (ref 22–32)
Calcium: 8.6 mg/dL — ABNORMAL LOW (ref 8.9–10.3)
Chloride: 103 mmol/L (ref 101–111)
Creatinine, Ser: 1.62 mg/dL — ABNORMAL HIGH (ref 0.44–1.00)
GFR calc non Af Amer: 31 mL/min — ABNORMAL LOW (ref >60)
GFR, EST AFRICAN AMERICAN: 36 mL/min — AB (ref >60)
GLUCOSE: 64 mg/dL — AB (ref 65–99)
POTASSIUM: 4.7 mmol/L (ref 3.5–5.1)
Sodium: 135 mmol/L (ref 135–145)
Total Bilirubin: 1.3 mg/dL — ABNORMAL HIGH (ref 0.3–1.2)
Total Protein: 6.1 g/dL — ABNORMAL LOW (ref 6.5–8.1)

## 2017-07-24 LAB — CBC WITH DIFFERENTIAL/PLATELET
BASOS PCT: 0 %
Basophils Absolute: 0 10*3/uL (ref 0.0–0.1)
EOS ABS: 0 10*3/uL (ref 0.0–0.7)
EOS PCT: 0 %
HCT: 40.2 % (ref 36.0–46.0)
Hemoglobin: 12.3 g/dL (ref 12.0–15.0)
Lymphocytes Relative: 25 %
Lymphs Abs: 2.9 10*3/uL (ref 0.7–4.0)
MCH: 24.9 pg — ABNORMAL LOW (ref 26.0–34.0)
MCHC: 30.6 g/dL (ref 30.0–36.0)
MCV: 81.4 fL (ref 78.0–100.0)
MONOS PCT: 9 %
Monocytes Absolute: 1 10*3/uL (ref 0.1–1.0)
NEUTROS PCT: 66 %
Neutro Abs: 7.6 10*3/uL (ref 1.7–7.7)
PLATELETS: 237 10*3/uL (ref 150–400)
RBC: 4.94 MIL/uL (ref 3.87–5.11)
RDW: 20.8 % — AB (ref 11.5–15.5)
WBC: 11.6 10*3/uL — ABNORMAL HIGH (ref 4.0–10.5)

## 2017-07-24 LAB — HEPATIC FUNCTION PANEL
ALBUMIN: 3.2 g/dL — AB (ref 3.5–5.0)
ALK PHOS: 137 U/L — AB (ref 38–126)
ALT: 486 U/L — ABNORMAL HIGH (ref 14–54)
AST: 467 U/L — ABNORMAL HIGH (ref 15–41)
BILIRUBIN TOTAL: 1.5 mg/dL — AB (ref 0.3–1.2)
Bilirubin, Direct: 0.6 mg/dL — ABNORMAL HIGH (ref 0.1–0.5)
Indirect Bilirubin: 0.9 mg/dL (ref 0.3–0.9)
TOTAL PROTEIN: 6.3 g/dL — AB (ref 6.5–8.1)

## 2017-07-24 LAB — BRAIN NATRIURETIC PEPTIDE: B Natriuretic Peptide: 670.9 pg/mL — ABNORMAL HIGH (ref 0.0–100.0)

## 2017-07-24 LAB — RAPID URINE DRUG SCREEN, HOSP PERFORMED
Amphetamines: NOT DETECTED
BARBITURATES: NOT DETECTED
BENZODIAZEPINES: NOT DETECTED
COCAINE: NOT DETECTED
Opiates: NOT DETECTED
TETRAHYDROCANNABINOL: NOT DETECTED

## 2017-07-24 LAB — TROPONIN I
TROPONIN I: 0.07 ng/mL — AB (ref <0.03)
Troponin I: 0.09 ng/mL (ref <0.03)

## 2017-07-24 LAB — GLUCOSE, CAPILLARY
GLUCOSE-CAPILLARY: 90 mg/dL (ref 65–99)
Glucose-Capillary: 51 mg/dL — ABNORMAL LOW (ref 65–99)
Glucose-Capillary: 76 mg/dL (ref 65–99)

## 2017-07-24 LAB — CBG MONITORING, ED: Glucose-Capillary: 86 mg/dL (ref 65–99)

## 2017-07-24 LAB — I-STAT TROPONIN, ED: Troponin i, poc: 0 ng/mL (ref 0.00–0.08)

## 2017-07-24 LAB — TSH: TSH: 2.241 u[IU]/mL (ref 0.350–4.500)

## 2017-07-24 LAB — AMMONIA: AMMONIA: 24 umol/L (ref 9–35)

## 2017-07-24 MED ORDER — SODIUM CHLORIDE 0.9 % IV SOLN: INTRAVENOUS | Status: DC

## 2017-07-24 MED ORDER — DEXTROSE 50 % IV SOLN: 1.0000 | INTRAVENOUS | Status: DC | PRN

## 2017-07-24 MED ORDER — ONDANSETRON HCL 4 MG/2ML IJ SOLN
4.0000 mg | Freq: Once | INTRAMUSCULAR | Status: AC
  Administered 2017-07-24: 4 mg via INTRAVENOUS
  Filled 2017-07-24: qty 2

## 2017-07-24 MED ORDER — DEXTROSE 50 % IV SOLN
INTRAVENOUS | Status: AC
  Administered 2017-07-24: 25 mL via INTRAVENOUS
  Filled 2017-07-24: qty 50

## 2017-07-24 MED ORDER — RIVAROXABAN 15 MG PO TABS: 15.0000 mg | ORAL_TABLET | Freq: Every day | ORAL | Status: DC

## 2017-07-24 MED ORDER — SENNOSIDES-DOCUSATE SODIUM 8.6-50 MG PO TABS: 1.0000 | ORAL_TABLET | Freq: Every evening | ORAL | Status: DC | PRN

## 2017-07-24 MED ORDER — PANTOPRAZOLE SODIUM 40 MG PO TBEC
40.0000 mg | DELAYED_RELEASE_TABLET | Freq: Every day | ORAL | Status: DC
  Administered 2017-07-24 – 2017-07-27 (×4): 40 mg via ORAL
  Filled 2017-07-24 (×4): qty 1

## 2017-07-24 MED ORDER — NITROGLYCERIN 0.4 MG SL SUBL: 0.4000 mg | SUBLINGUAL_TABLET | SUBLINGUAL | Status: DC | PRN

## 2017-07-24 MED ORDER — FUROSEMIDE 10 MG/ML IJ SOLN
20.0000 mg | Freq: Once | INTRAMUSCULAR | Status: AC
  Administered 2017-07-24: 20 mg via INTRAVENOUS
  Filled 2017-07-24: qty 2

## 2017-07-24 MED ORDER — METOPROLOL TARTRATE 100 MG PO TABS: 100.0000 mg | ORAL_TABLET | Freq: Two times a day (BID) | ORAL | Status: DC

## 2017-07-24 MED ORDER — DEXTROSE 50 % IV SOLN
25.0000 mL | Freq: Once | INTRAVENOUS | Status: AC
  Administered 2017-07-24 (×2): 25 mL via INTRAVENOUS
  Administered 2017-07-25: 50 mL via INTRAVENOUS
  Filled 2017-07-24: qty 50

## 2017-07-24 MED ORDER — ABATACEPT 125 MG/ML ~~LOC~~ SOAJ: 125.0000 mg | SUBCUTANEOUS | Status: DC

## 2017-07-24 MED ORDER — DICLOFENAC SODIUM 1 % TD GEL
2.0000 g | Freq: Every evening | TRANSDERMAL | Status: DC | PRN
  Filled 2017-07-24: qty 100

## 2017-07-24 MED ORDER — DILTIAZEM LOAD VIA INFUSION
10.0000 mg | Freq: Once | INTRAVENOUS | Status: AC
  Administered 2017-07-24: 10 mg via INTRAVENOUS
  Filled 2017-07-24: qty 10

## 2017-07-24 MED ORDER — GABAPENTIN 100 MG PO CAPS: 100.0000 mg | ORAL_CAPSULE | Freq: Every day | ORAL | Status: DC

## 2017-07-24 MED ORDER — VITAMIN B-12 1000 MCG PO TABS
1000.0000 ug | ORAL_TABLET | Freq: Every evening | ORAL | Status: DC
  Administered 2017-07-26: 1000 ug via ORAL
  Filled 2017-07-24 (×2): qty 1

## 2017-07-24 MED ORDER — METOPROLOL TARTRATE 5 MG/5ML IV SOLN
2.5000 mg | Freq: Four times a day (QID) | INTRAVENOUS | Status: DC
  Administered 2017-07-24 – 2017-07-25 (×4): 2.5 mg via INTRAVENOUS
  Filled 2017-07-24 (×4): qty 5

## 2017-07-24 MED ORDER — ROSUVASTATIN CALCIUM 40 MG PO TABS: 40.0000 mg | ORAL_TABLET | Freq: Every evening | ORAL | Status: DC

## 2017-07-24 MED ORDER — FERROUS GLUCONATE 324 (38 FE) MG PO TABS
324.0000 mg | ORAL_TABLET | Freq: Two times a day (BID) | ORAL | Status: DC
  Administered 2017-07-25 – 2017-07-27 (×4): 324 mg via ORAL
  Filled 2017-07-24 (×7): qty 1

## 2017-07-24 MED ORDER — LEFLUNOMIDE 20 MG PO TABS
20.0000 mg | ORAL_TABLET | Freq: Every day | ORAL | Status: DC
  Administered 2017-07-25: 20 mg via ORAL
  Filled 2017-07-24 (×2): qty 1

## 2017-07-24 MED ORDER — BUPROPION HCL ER (SR) 150 MG PO TB12
150.0000 mg | ORAL_TABLET | Freq: Two times a day (BID) | ORAL | Status: DC
  Administered 2017-07-25 – 2017-07-27 (×5): 150 mg via ORAL
  Filled 2017-07-24 (×6): qty 1

## 2017-07-24 MED ORDER — ONDANSETRON HCL 4 MG/2ML IJ SOLN: 4.0000 mg | Freq: Four times a day (QID) | INTRAMUSCULAR | Status: DC | PRN

## 2017-07-24 MED ORDER — MECLIZINE HCL 25 MG PO TABS: 12.5000 mg | ORAL_TABLET | ORAL | Status: DC | PRN

## 2017-07-24 MED ORDER — METOPROLOL TARTRATE 5 MG/5ML IV SOLN: 5.0000 mg | Freq: Four times a day (QID) | INTRAVENOUS | Status: DC

## 2017-07-24 MED ORDER — VITAMIN D 1000 UNITS PO TABS
1000.0000 [IU] | ORAL_TABLET | Freq: Every evening | ORAL | Status: DC
  Administered 2017-07-26: 1000 [IU] via ORAL
  Filled 2017-07-24 (×2): qty 1

## 2017-07-24 MED ORDER — PREDNISONE 5 MG PO TABS
5.0000 mg | ORAL_TABLET | Freq: Every day | ORAL | Status: DC
  Administered 2017-07-25 – 2017-07-27 (×3): 5 mg via ORAL
  Filled 2017-07-24 (×4): qty 1

## 2017-07-24 MED ORDER — DILTIAZEM HCL-DEXTROSE 100-5 MG/100ML-% IV SOLN (PREMIX)
5.0000 mg/h | INTRAVENOUS | Status: DC
  Administered 2017-07-24: 10 mg/h via INTRAVENOUS
  Administered 2017-07-24: 5 mg/h via INTRAVENOUS
  Administered 2017-07-24 – 2017-07-25 (×2): 15 mg/h via INTRAVENOUS
  Administered 2017-07-25: 10 mg/h via INTRAVENOUS
  Administered 2017-07-26: 15 mg/h via INTRAVENOUS
  Filled 2017-07-24 (×7): qty 100

## 2017-07-24 MED ORDER — SODIUM CHLORIDE 0.9 % IV BOLUS
500.0000 mL | Freq: Once | INTRAVENOUS | Status: AC
  Administered 2017-07-24: 500 mL via INTRAVENOUS

## 2017-07-24 NOTE — ED Provider Notes (Signed)
[facility] DE[facility] Provider Note   CSN: [id number] Arrival date & time: 07/24/17  7893     History   Chief Complaint Chief Complaint  Patient presents with  . Chest Pain    HPI [name] is a 72 y.o. female.  Patient complains of rapid heart rate.  The history is provided by the patient. No language interpreter was used.  Chest Pain   This is a new problem. The current episode started 12 to 24 hours ago. The problem occurs constantly. The problem has not changed since onset.The pain is associated with exertion. The pain is present in the substernal region. The pain is at a severity of 5/10. The pain is moderate. The quality of the pain is described as dull. The pain does not radiate. The symptoms are aggravated by exertion. Associated symptoms include palpitations. Pertinent negatives include no abdominal pain, no back pain, no cough and no headaches.  Pertinent negatives for past medical history include no seizures.    Past Medical History:  Diagnosis Date  . Actinic keratosis   . Anemia    hx of  . Anemia of chronic renal failure, stage 3 (moderate) ([place]) 07/17/2015  . Anticoagulant causing adverse effect in therapeutic use 07/17/2015  . Atrial fibrillation ([place])    a. persistent, she remains on Xarelto  . Carpal tunnel syndrome   . Chronic diastolic (congestive) heart failure ([place])    a. 04/2016: echo showing a preserved EF of 55-60% with mild MR. LA and RA midly dilated.   . Edema    left leg at ankle resolved now  . Gastroesophageal reflux disease   . Hepatitis 1970   not sure what kind  . Hiatal hernia   . Hyperlipidemia   . Hypertension   . Iron deficiency anemia due to chronic blood loss 07/17/2015  . Jaundice    age 81  . Peripheral vascular disease ([place]) yrs ago   DVT left lower leg questionale told by 2 drs i had no clot, 1 md said i did  . Rheumatoid arthritis(714.0)   . Right knee pain   . Type II diabetes mellitus  ([place])    type2    Patient Active Problem List   Diagnosis Date Noted  . Stroke, acute, embolic ([place]) 81/04/7508  . Mitral regurgitation 06/17/2017  . Acute CVA (cerebrovascular accident) ([place])   . Thyroid nodule 06/15/2017  . Atrial fibrillation with RVR ([place]) 06/14/2017  . CVA (cerebral vascular accident) ([place]) 06/14/2017  . Type II diabetes mellitus ([place])   . Peripheral vascular disease ([place])   . Hyperlipidemia   . Hiatal hernia   . Edema   . Chronic diastolic (congestive) heart failure ([place])   . Carpal tunnel syndrome   . Atrial fibrillation ([place])   . Anemia   . Actinic keratosis   . Intractable nausea and vomiting 05/31/2016  . Nausea & vomiting 05/30/2016  . Abnormal chest x-ray 05/30/2016  . Diabetes mellitus with complication ([place])   . Influenza A 05/21/2016  . Chronic atrial fibrillation ([place]) 05/20/2016  . Acute CHF (congestive heart failure) ([place]) 05/20/2016  . History of GI bleed 03/29/2016  . Chronic anticoagulation 03/29/2016  . Symptomatic anemia 03/19/2016  . Spinal stenosis of lumbar region 02/10/2016  . Varicose veins of left leg with edema 09/15/2015  . Preop cardiovascular exam 08/25/2015  . Iron deficiency anemia due to chronic blood loss 07/17/2015  . Anemia of chronic renal failure, stage 3 (moderate) ([place]) 07/17/2015  .  Anticoagulant causing adverse effect in therapeutic use 07/17/2015  . Symptomatic bradycardia 02/21/2015  . Bradycardia   . Acute deep vein thrombosis (DVT) of left lower extremity ([place]) 05/06/2014  . Abdominal wall mass of right lower quadrant 07/08/2013  . OA (osteoarthritis) of hip 04/03/2013  . Osteoarthritis of hip 01/18/2013  . Night sweats 09/06/2012  . Chest pain   . Anxiety and depression   . Gastroesophageal reflux disease   . Hepatitis   . Jaundice   . Hypertension 07/01/2010  . Hypercholesterolemia 07/01/2010  . DM2 (diabetes mellitus, type 2) ([place]) 07/01/2010  . Rheumatoid arthritis ([place]) 07/01/2010  . Sinus  tachycardia 07/01/2010  . Chest pain, atypical 07/01/2010    Past Surgical History:  Procedure Laterality Date  . CATARACT EXTRACTION Bilateral   . COLONOSCOPY WITH PROPOFOL N/A 02/20/2015   Procedure: COLONOSCOPY WITH PROPOFOL;  Surgeon: Carol Ada, MD;  Location: [facility];  Service: Endoscopy;  Laterality: N/A;  . ENTEROSCOPY N/A 03/23/2016   Procedure: ENTEROSCOPY;  Surgeon: Carol Ada, MD;  Location: [facility];  Service: Endoscopy;  Laterality: N/A;  . ENTEROSCOPY N/A 06/17/2016   Procedure: ENTEROSCOPY;  Surgeon: Carol Ada, MD;  Location: [facility];  Service: Endoscopy;  Laterality: N/A;  . ESOPHAGOGASTRODUODENOSCOPY (EGD) WITH PROPOFOL N/A 02/20/2015   Procedure: ESOPHAGOGASTRODUODENOSCOPY (EGD) WITH PROPOFOL;  Surgeon: Carol Ada, MD;  Location: [facility];  Service: Endoscopy;  Laterality: N/A;  . GIVENS CAPSULE STUDY N/A 03/21/2016   Procedure: GIVENS CAPSULE STUDY;  Surgeon: Carol Ada, MD;  Location: [facility];  Service: Endoscopy;  Laterality: N/A;  . HAND SURGERY  1995 and 1996   artificial joints both hands  . JOINT REPLACEMENT    . LUMBAR LAMINECTOMY/DECOMPRESSION MICRODISCECTOMY N/A 02/10/2016   Procedure: MICROLUMBAR DECOMPRESSION L4-L5 AND L3- L4, AND EXCISION OF SYNOVIAL CYST L4-L5;  Surgeon: Susa Day, MD;  Location: [facility];  Service: Orthopedics;  Laterality: N/A;  . TOTAL HIP ARTHROPLASTY Left 2009  . TOTAL HIP ARTHROPLASTY Right 04/03/2013   Procedure: RIGHT TOTAL HIP ARTHROPLASTY ANTERIOR APPROACH;  Surgeon: Gearlean Alf, MD;  Location: [facility];  Service: Orthopedics;  Laterality: Right;     OB History   None      Home Medications    Prior to Admission medications   Medication Sig Start Date End Date Taking? Authorizing Provider  Abatacept (ORENCIA CLICKJECT) 811 MG/ML SOAJ Inject 125 mg into the skin every Thursday.    Yes [provider]  acetaminophen (TYLENOL) 325 MG tablet Take 1-2 tablets (325-650 mg total) by  mouth every 4 (four) hours as needed for mild pain. 07/04/17  Yes Love, Ivan Anchors, PA-C  buPROPion (WELLBUTRIN SR) 150 MG 12 hr tablet Take 150 mg by mouth 2 (two) times daily.     Yes [provider]  Cholecalciferol (VITAMIN D3) 1000 units CAPS Take 1,000 Units by mouth every evening.    Yes [provider]  diltiazem (CARDIZEM CD) 360 MG 24 hr capsule TAKE 1 CAPSULE (360 MG TOTAL) BY MOUTH DAILY. 05/02/17  Yes Lelon Perla, MD  esomeprazole (NEXIUM) 40 MG capsule Take 40 mg by mouth every morning.    Yes [provider]  ferrous gluconate (FERGON) 324 MG tablet Take 1 tablet (324 mg total) by mouth 2 (two) times daily with a meal. 05/17/16  Yes Rizwan, Eunice Blase, MD  gabapentin (NEURONTIN) 100 MG capsule Take 1 capsule (100 mg total) by mouth at bedtime. 07/04/17  Yes Love, Ivan Anchors, PA-C  glimepiride (AMARYL) 2 MG tablet  Take 1 tablet (2 mg total) by mouth daily with breakfast. 07/05/17  Yes Love, Ivan Anchors, PA-C  leflunomide (ARAVA) 20 MG tablet Take 20 mg by mouth daily.   Yes [provider]  meclizine (ANTIVERT) 12.5 MG tablet Take 12.5 mg by mouth every 4 (four) hours as needed for dizziness (Nausea).   Yes [provider]  metoprolol tartrate (LOPRESSOR) 100 MG tablet Take 1 tablet (100 mg total) by mouth 2 (two) times daily. 07/04/17  Yes Love, Ivan Anchors, PA-C  Multiple Vitamins-Minerals (PRESERVISION AREDS 2) CAPS Take 1 capsule by mouth 2 (two) times daily.   Yes [provider]  nitroGLYCERIN (NITROSTAT) 0.4 MG SL tablet Place 1 tablet (0.4 mg total) under the tongue every 5 (five) minutes as needed for chest pain (x 3 doses). 11/28/16  Yes Lelon Perla, MD  potassium chloride SA (K-DUR,KLOR-CON) 20 MEQ tablet Take 1 tablet (20 mEq total) by mouth daily. 07/05/17  Yes Love, Ivan Anchors, PA-C  predniSONE (DELTASONE) 5 MG tablet Take 5 mg by mouth daily.   Yes [provider]  rivaroxaban (XARELTO) 20 MG TABS tablet Take 1 tablet (20 mg  total) by mouth daily with supper. 07/05/17  Yes Love, Ivan Anchors, PA-C  rosuvastatin (CRESTOR) 40 MG tablet Take 1 tablet (40 mg total) by mouth every evening. 06/19/17  Yes Bland, Scott, DO  senna-docusate (SENOKOT-S) 8.6-50 MG tablet Take 1 tablet by mouth at bedtime as needed for mild constipation. 06/19/17  Yes Sherene Sires, DO  vitamin B-12 1000 MCG tablet Take 1 tablet (1,000 mcg total) by mouth every evening. 07/05/17  Yes Love, Ivan Anchors, PA-C  VOLTAREN 1 % GEL Apply 2 g topically at bedtime as needed for pain. Knees, calf 04/27/17  Yes [provider]    Family History Family History  Problem Relation Age of Onset  . Stroke Father   . Heart failure Sister   . Hypertension Sister   . Heart attack Neg Hx     Social History Social History   Tobacco Use  . Smoking status: Never Smoker  . Smokeless tobacco: Never Used  Substance Use Topics  . Alcohol use: No    Alcohol/week: 0.0 oz  . Drug use: No     Allergies   Patient has no known allergies.   Review of Systems Review of Systems  Constitutional: Negative for appetite change and fatigue.  HENT: Negative for congestion, ear discharge and sinus pressure.   Eyes: Negative for discharge.  Respiratory: Negative for cough.   Cardiovascular: Positive for chest pain and palpitations.  Gastrointestinal: Negative for abdominal pain and diarrhea.  Genitourinary: Negative for frequency and hematuria.  Musculoskeletal: Negative for back pain.  Skin: Negative for rash.  Neurological: Negative for seizures and headaches.  Psychiatric/Behavioral: Negative for hallucinations.     Physical Exam Updated Vital Signs BP (!) 168/91   Pulse (!) 116   Temp 99.2 F (37.3 C) (Rectal)   Resp (!) 28   SpO2 98%   Physical Exam  Constitutional: She appears well-developed.  HENT:  Head: Normocephalic.  Eyes: Conjunctivae and EOM are normal. No scleral icterus.  Neck: Neck supple. No thyromegaly present.  Cardiovascular: Exam  reveals no gallop and no friction rub.  No murmur heard. Rapid irregular rate  Pulmonary/Chest: No stridor. She has no wheezes. She has no rales. She exhibits no tenderness.  Abdominal: She exhibits no distension. There is no tenderness. There is no rebound.  Musculoskeletal: Normal range of motion. She  exhibits no edema.  Lymphadenopathy:    She has no cervical adenopathy.  Neurological: She exhibits normal muscle tone. Coordination normal.  Mildly lethargic  Skin: No rash noted. No erythema.     ED Treatments / Results  Labs (all labs ordered are listed, but only abnormal results are displayed) Labs Reviewed  CBC WITH DIFFERENTIAL/PLATELET - Abnormal; Notable for the following components:      Result Value   WBC 11.6 (*)    MCH 24.9 (*)    RDW 20.8 (*)    All other components within normal limits  COMPREHENSIVE METABOLIC PANEL - Abnormal; Notable for the following components:   CO2 14 (*)    Glucose, Bld 64 (*)    BUN 32 (*)    Creatinine, Ser 1.62 (*)    Calcium 8.6 (*)    Total Protein 6.1 (*)    Albumin 3.1 (*)    AST 502 (*)    ALT 482 (*)    Alkaline Phosphatase 132 (*)    Total Bilirubin 1.3 (*)    GFR calc non Af Amer 31 (*)    GFR calc Af Amer 36 (*)    Anion gap 18 (*)    All other components within normal limits  BRAIN NATRIURETIC PEPTIDE - Abnormal; Notable for the following components:   B Natriuretic Peptide 670.9 (*)    All other components within normal limits  URINALYSIS, ROUTINE W REFLEX MICROSCOPIC - Abnormal; Notable for the following components:   Color, Urine AMBER (*)    APPearance CLOUDY (*)    Hgb urine dipstick SMALL (*)    Protein, ur >=300 (*)    Squamous Epithelial / LPF 0-5 (*)    Non Squamous Epithelial 0-5 (*)    All other components within normal limits  I-STAT CHEM 8, ED - Abnormal; Notable for the following components:   BUN 39 (*)    Creatinine, Ser 1.60 (*)    Glucose, Bld 59 (*)    Calcium, Ion 0.83 (*)    TCO2 18 (*)     All other components within normal limits  I-STAT TROPONIN, ED    EKG None  Radiology Ct Head Wo Contrast  Result Date: 07/24/2017 CLINICAL DATA:  Altered mental status.  Nausea and vomiting. EXAM: CT HEAD WITHOUT CONTRAST TECHNIQUE: Contiguous axial images were obtained from the base of the skull through the vertex without intravenous contrast. COMPARISON:  Brain MRI and head CT 06/14/2017 FINDINGS: Brain: Known recent infarcts in the right occipital lobe and temporoparietal region. No visible hemorrhagic conversion. Small remote right inferior cerebellar infarct. No evidence of acute infarct, hemorrhage, hydrocephalus, or mass. Vascular: Atherosclerotic calcification.  No hyperdense vessel. Skull: No acute or aggressive finding Sinuses/Orbits: Bilateral cataract resection. IMPRESSION: 1. No acute finding. 2. Known recent right cerebral infarcts. Electronically Signed   By: Monte Fantasia M.D.   On: 07/24/2017 11:54   Dg Chest Port 1 View  Result Date: 07/24/2017 CLINICAL DATA:  Onset of shortness of breath and left-sided chest pain this morning. History of atrial fibrillation, diabetes, and hypertension. Nonsmoker. EXAM: PORTABLE CHEST 1 VIEW COMPARISON:  PA and lateral chest x-ray of July 11, 2016 FINDINGS: The lungs are adequately inflated. The interstitial markings are mildly increased. The cardiac silhouette is enlarged. The pulmonary vascularity is mildly prominent. There is no definite pleural effusion. The mediastinum is normal in width. The observed bony thorax exhibits no acute abnormality. There is an old fracture of the lateral aspect of the left  third rib. IMPRESSION: Findings suggestive of low-grade CHF with mild interstitial edema. No alveolar pneumonia nor pneumothorax. Electronically Signed   By: David  Martinique M.D.   On: 07/24/2017 10:02    Procedures Procedures (including critical care time)  Medications Ordered in ED Medications  diltiazem (CARDIZEM) 1 mg/mL load via  infusion 10 mg (10 mg Intravenous Bolus from Bag 07/24/17 0934)    And  diltiazem (CARDIZEM) 100 mg in dextrose 5% 162mL (1 mg/mL) infusion (15 mg/hr Intravenous Rate/Dose Change 07/24/17 1029)  ondansetron (ZOFRAN) injection 4 mg (4 mg Intravenous Given 07/24/17 0934)  sodium chloride 0.9 % bolus 500 mL (0 mLs Intravenous Stopped 07/24/17 1250)  dextrose 50 % solution 25 mL (25 mLs Intravenous Given 07/24/17 1255)   CRITICAL CARE Performed by: Milton Ferguson Total critical care time:37minutes Critical care time was exclusive of separately billable procedures and treating other patients. Critical care was necessary to treat or prevent imminent or life-threatening deterioration. Critical care was time spent personally by me on the following activities: development of treatment plan with patient and/or surrogate as well as nursing, discussions with consultants, evaluation of patient's response to treatment, examination of patient, obtaining history from patient or surrogate, ordering and performing treatments and interventions, ordering and review of laboratory studies, ordering and review of radiographic studies, pulse oximetry and re-evaluation of patient's condition.   Initial Impression / Assessment and Plan / ED Course  I have reviewed the triage vital signs and the nursing notes.  Pertinent labs & imaging results that were available during my care of the patient were reviewed by me and considered in my medical decision making (see chart for details).   Patient will be admitted for rapid atrial fib.  She is controlled some with Cardizem   Final Clinical Impressions(s) / ED Diagnoses   Final diagnoses:  Atrial fibrillation with RVR [facility])    ED Discharge Orders    None       Milton Ferguson, MD 07/24/17 1333

## 2017-07-24 NOTE — Consult Note (Addendum)
Cardiology Consultation:   Patient ID: [name]; [id number]; 03-11-1946   Admit date: 07/24/2017 Date of Consult: 07/24/2017  Primary Care Provider: Aletha Halim., PA-C Primary Cardiologist: Dr Stanford Breed, 04/19/2017 Primary Electrophysiologist:  n/a   Patient Profile:   [name] is a 72 y.o. female with a hx of chronic A fib, nl MV 2016, nl EF echo 85/4627, recent embolic CVA, CKD III, D-CHF, HTN, HLD, DM2, GERD, RA, anxiety/depression, falls, anemia w/ iron deficiency, ?chronic blood loss, who is being seen today for the evaluation of atrial fib at the request of Dr Nori Riis.  History of Present Illness:   Ms. Willingham had been at [facility] for rehab since 07/04/2017, after her CVA. Her son is in the ER with her.  She has been working on getting strong enough to stand and walk, is currently in a wheelchair.   She has had problems with confusion, but it has been worse over the last 4 days.   Her son and staff plus records provide information, the patient is not able to provide much information.   Ms Evans came to the ER today for chest pain that started sometime within the last 24 hours. 5/10, dull pain. Pt currently denies palpitations, but had them earlier per staff. She says the pain is under her L breast and has chest wall tenderness in this area. Pt denies SOB, N&V or diaphoresis.   Currently, her R wrist is bothering her the most, it has been hurting a lot lately, she has a splint to wear, but does not have it on.    Past Medical History:  Diagnosis Date  . Abnormal LFTs 07/24/2017  . Actinic keratosis   . Anemia    hx of  . Anemia of chronic renal failure, stage 3 (moderate) ([place]) 07/17/2015  . Anticoagulant causing adverse effect in therapeutic use 07/17/2015  . Anxiety   . Arthritis    rheumatoid  . Atrial fibrillation ([place])    a. persistent, she remains on Xarelto  . Carpal tunnel syndrome   . Chronic diastolic (congestive) heart failure ([place])    a. 04/2016: echo showing a preserved EF of 55-60% with mild MR. LA and RA midly dilated.   . Edema    left leg at ankle resolved now  . Gastroesophageal reflux disease   . Hepatitis 1970   not sure what kind  . Hiatal hernia   . Hyperlipidemia   . Hypertension   . Iron deficiency anemia due to chronic blood loss 07/17/2015  . Jaundice    age 50  . Peripheral vascular disease ([place]) yrs ago   DVT left lower leg questionale told by 2 drs i had no clot, 1 md said i did  . Rheumatoid arthritis(714.0)   . Right knee pain   . Stroke ([place])   . Type II diabetes mellitus ([place])    type2    Past Surgical History:  Procedure Laterality Date  . CATARACT EXTRACTION Bilateral   . COLONOSCOPY WITH PROPOFOL N/A 02/20/2015   Procedure: COLONOSCOPY WITH PROPOFOL;  Surgeon: Carol Ada, MD;  Location: [facility];  Service: Endoscopy;  Laterality: N/A;  . ENTEROSCOPY N/A 03/23/2016   Procedure: ENTEROSCOPY;  Surgeon: Carol Ada, MD;  Location: [facility];  Service: Endoscopy;  Laterality: N/A;  . ENTEROSCOPY N/A 06/17/2016   Procedure: ENTEROSCOPY;  Surgeon: Carol Ada, MD;  Location: [facility];  Service: Endoscopy;  Laterality: N/A;  . ESOPHAGOGASTRODUODENOSCOPY (EGD) WITH PROPOFOL N/A 02/20/2015   Procedure: ESOPHAGOGASTRODUODENOSCOPY (  EGD) WITH PROPOFOL;  Surgeon: Carol Ada, MD;  Location: [facility];  Service: Endoscopy;  Laterality: N/A;  . GIVENS CAPSULE STUDY N/A 03/21/2016   Procedure: GIVENS CAPSULE STUDY;  Surgeon: Carol Ada, MD;  Location: [facility];  Service: Endoscopy;  Laterality: N/A;  . HAND SURGERY  1995 and 1996   artificial joints both hands  . JOINT REPLACEMENT    . LUMBAR LAMINECTOMY/DECOMPRESSION MICRODISCECTOMY N/A 02/10/2016   Procedure: MICROLUMBAR DECOMPRESSION L4-L5 AND L3- L4, AND EXCISION OF SYNOVIAL CYST L4-L5;  Surgeon: Susa Day, MD;  Location: [facility];  Service: Orthopedics;  Laterality: N/A;  . TOTAL HIP ARTHROPLASTY Left 2009  . TOTAL HIP  ARTHROPLASTY Right 04/03/2013   Procedure: RIGHT TOTAL HIP ARTHROPLASTY ANTERIOR APPROACH;  Surgeon: Gearlean Alf, MD;  Location: [facility];  Service: Orthopedics;  Laterality: Right;     Prior to Admission medications   Medication Sig Start Date End Date Taking? Authorizing Provider  Abatacept (ORENCIA CLICKJECT) 161 MG/ML SOAJ Inject 125 mg into the skin every Thursday.    Yes [provider]  acetaminophen (TYLENOL) 325 MG tablet Take 1-2 tablets (325-650 mg total) by mouth every 4 (four) hours as needed for mild pain. 07/04/17  Yes Love, Ivan Anchors, PA-C  buPROPion (WELLBUTRIN SR) 150 MG 12 hr tablet Take 150 mg by mouth 2 (two) times daily.     Yes [provider]  Cholecalciferol (VITAMIN D3) 1000 units CAPS Take 1,000 Units by mouth every evening.    Yes [provider]  diltiazem (CARDIZEM CD) 360 MG 24 hr capsule TAKE 1 CAPSULE (360 MG TOTAL) BY MOUTH DAILY. 05/02/17  Yes Lelon Perla, MD  esomeprazole (NEXIUM) 40 MG capsule Take 40 mg by mouth every morning.    Yes [provider]  ferrous gluconate (FERGON) 324 MG tablet Take 1 tablet (324 mg total) by mouth 2 (two) times daily with a meal. 05/17/16  Yes Rizwan, Eunice Blase, MD  gabapentin (NEURONTIN) 100 MG capsule Take 1 capsule (100 mg total) by mouth at bedtime. 07/04/17  Yes Love, Ivan Anchors, PA-C  glimepiride (AMARYL) 2 MG tablet Take 1 tablet (2 mg total) by mouth daily with breakfast. 07/05/17  Yes Love, Ivan Anchors, PA-C  leflunomide (ARAVA) 20 MG tablet Take 20 mg by mouth daily.   Yes [provider]  meclizine (ANTIVERT) 12.5 MG tablet Take 12.5 mg by mouth every 4 (four) hours as needed for dizziness (Nausea).   Yes [provider]  metoprolol tartrate (LOPRESSOR) 100 MG tablet Take 1 tablet (100 mg total) by mouth 2 (two) times daily. 07/04/17  Yes Love, Ivan Anchors, PA-C  Multiple Vitamins-Minerals (PRESERVISION AREDS 2) CAPS Take 1 capsule by mouth 2 (two) times daily.   Yes [provider]  nitroGLYCERIN (NITROSTAT) 0.4 MG SL tablet Place 1 tablet (0.4 mg total) under the tongue every 5 (five) minutes as needed for chest pain (x 3 doses). 11/28/16  Yes Lelon Perla, MD  potassium chloride SA (K-DUR,KLOR-CON) 20 MEQ tablet Take 1 tablet (20 mEq total) by mouth daily. 07/05/17  Yes Love, Ivan Anchors, PA-C  predniSONE (DELTASONE) 5 MG tablet Take 5 mg by mouth daily.   Yes [provider]  rivaroxaban (XARELTO) 20 MG TABS tablet Take 1 tablet (20 mg total) by mouth daily with supper. 07/05/17  Yes Love, Ivan Anchors, PA-C  rosuvastatin (CRESTOR) 40 MG tablet Take 1 tablet (40 mg total) by mouth every evening. 06/19/17  Yes Sherene Sires, DO  senna-docusate (  SENOKOT-S) 8.6-50 MG tablet Take 1 tablet by mouth at bedtime as needed for mild constipation. 06/19/17  Yes Sherene Sires, DO  vitamin B-12 1000 MCG tablet Take 1 tablet (1,000 mcg total) by mouth every evening. 07/05/17  Yes Love, Ivan Anchors, PA-C  VOLTAREN 1 % GEL Apply 2 g topically at bedtime as needed for pain. Knees, calf 04/27/17  Yes [provider]    Inpatient Medications: Scheduled Meds: . [START ON 07/27/2017] Abatacept  125 mg Subcutaneous Q Thu  . buPROPion  150 mg Oral BID  . cholecalciferol  1,000 Units Oral QPM  . ferrous gluconate  324 mg Oral BID WC  . leflunomide  20 mg Oral Daily  . metoprolol tartrate  100 mg Oral BID  . pantoprazole  40 mg Oral Daily  . predniSONE  5 mg Oral Daily  . Rivaroxaban  15 mg Oral Q supper  . cyanocobalamin  1,000 mcg Oral QPM   Continuous Infusions: . sodium chloride 75 mL/hr (07/24/17 1527)  . diltiazem (CARDIZEM) infusion 15 mg/hr (07/24/17 1604)   PRN Meds: diclofenac sodium, meclizine, nitroGLYCERIN, ondansetron (ZOFRAN) IV, senna-docusate  Allergies:   No Known Allergies  Social History:   Social History   Socioeconomic History  . Marital status: Divorced    Spouse name: Not on file  . Number of children: Not on file  . Years of education:  Not on file  . Highest education level: Not on file  Occupational History  . Occupation: Retired  Scientific laboratory technician  . Financial resource strain: Not on file  . Food insecurity:    Worry: Not on file    Inability: Not on file  . Transportation needs:    Medical: Not on file    Non-medical: Not on file  Tobacco Use  . Smoking status: Never Smoker  . Smokeless tobacco: Never Used  Substance and Sexual Activity  . Alcohol use: No    Alcohol/week: 0.0 oz  . Drug use: No  . Sexual activity: Not Currently  Lifestyle  . Physical activity:    Days per week: Not on file    Minutes per session: Not on file  . Stress: Not on file  Relationships  . Social connections:    Talks on phone: Not on file    Gets together: Not on file    Attends religious service: Not on file    Active member of club or organization: Not on file    Attends meetings of clubs or organizations: Not on file    Relationship status: Not on file  . Intimate partner violence:    Fear of current or ex partner: Not on file    Emotionally abused: Not on file    Physically abused: Not on file    Forced sexual activity: Not on file  Other Topics Concern  . Not on file  Social History Narrative  . Not on file    Family History:   Family History  Problem Relation Age of Onset  . Pneumonia Mother 39  . Stroke Father 57  . Heart failure Sister   . Hypertension Sister   . Heart attack Neg Hx    Family Status:  Family Status  Relation Name Status  . Mother  Deceased at age 81  . Father  Deceased at age 20  . MGM  Deceased  . MGF  Deceased  . PGM  Deceased  . PGF  Deceased  . Sister  (Not Specified)  . Sister  (  Not Specified)  . Neg Hx  (Not Specified)    ROS:  Please see the history of present illness.  All other ROS reviewed and negative.     Physical Exam/Data:   Vitals:   07/24/17 1447 07/24/17 1534 07/24/17 1545 07/24/17 1600  BP:    (!) 133/118  Pulse:   (!) 117 86  Resp:   19 (!) 21  Temp:   99.2 F (37.3 C)    TempSrc:      SpO2:   99% 100%  Weight: 172 lb (78 kg)     Height: 5\' 7"  (1.702 m)       Intake/Output Summary (Last 24 hours) at 07/24/2017 1715 Last data filed at 07/24/2017 1250 Gross per 24 hour  Intake 500 ml  Output -  Net 500 ml   Filed Weights   07/24/17 1447  Weight: 172 lb (78 kg)   Body mass index is 26.94 kg/m.  General:  Well nourished, well developed, elderly female, in no acute distress HEENT: normal for age Lymph: no adenopathy Neck:  JVD not elevated Endocrine:  No thryomegaly Vascular: No carotid bruits; 4/4 extremity pulses 2+, without bruits  Cardiac:  normal S1, S2; Irreg and rapid R&R; soft murmur  Lungs:  Decreased BS bases with a few rales bilaterally, no wheezing, rhonchi  Abd: moderately firm, diffusely tender, no hepatomegaly  Ext: no edema Musculoskeletal:  No deformities, BUE and BLE strength normal and equal Skin: warm and dry  Neuro:  CNs 2-12 intact, no new focal abnormalities noted  EKG:  The EKG was personally reviewed and demonstrates:  06/14/2017, atrial fib, RVR, HR 119, no ECG today Telemetry:  Telemetry was personally reviewed and demonstrates:  Atrial fib, rate generally rapid  Relevant CV Studies:  ECHO: 06/16/2017 - Left ventricle: The cavity size was normal. There was moderate   concentric hypertrophy. Systolic function was normal. The   estimated ejection fraction was in the range of 60% to 65%. Wall   motion was normal; there were no regional wall motion   abnormalities. - Mitral valve: Calcified annulus. Moderately thickened, moderately   calcified leaflets . The findings are consistent with moderate   stenosis. There was mild to moderate regurgitation. Valve area by   pressure half-time: 2.22 cm^2. Valve area by continuity equation   (using LVOT flow): 1.53 cm^2. - Right ventricle: The cavity size was mildly dilated. Wall   thickness was normal. Systolic function was mildly reduced. - Pulmonary  arteries: Systolic pressure was moderately increased.   PA peak pressure: 49 mm Hg (S). - Pericardium, extracardiac: There was no pericardial effusion.   MYOVIEW: 03/13/2015  1. Low risk study 2. Nl perfusion 3. Afib precluded gating  Laboratory Data:  Chemistry Recent Labs  Lab 07/24/17 0935 07/24/17 1007  NA 135 139  K 4.7 4.6  CL 103 106  CO2 14*  --   GLUCOSE 64* 59*  BUN 32* 39*  CREATININE 1.62* 1.60*  CALCIUM 8.6*  --   GFRNONAA 31*  --   GFRAA 36*  --   ANIONGAP 18*  --     Lab Results  Component Value Date   ALT 482 (H) 07/24/2017   AST 502 (H) 07/24/2017   ALKPHOS 132 (H) 07/24/2017   BILITOT 1.3 (H) 07/24/2017   Hematology Recent Labs  Lab 07/24/17 0935 07/24/17 1007  WBC 11.6*  --   RBC 4.94  --   HGB 12.3 13.6  HCT 40.2 40.0  MCV 81.4  --  MCH 24.9*  --   MCHC 30.6  --   RDW 20.8*  --   PLT 237  --    Cardiac EnzymesNo results for input(s): TROPONINI in the last 168 hours.  Recent Labs  Lab 07/24/17 1006  TROPIPOC 0.00    BNP Recent Labs  Lab 07/24/17 1007  BNP 670.9*    TSH:  Lab Results  Component Value Date   TSH 2.241 07/24/2017   Lipids: Lab Results  Component Value Date   CHOL 143 06/15/2017   HDL 31 (L) 06/15/2017   LDLCALC 66 06/15/2017   LDLDIRECT 117.3 09/06/2012   TRIG 229 (H) 06/15/2017   CHOLHDL 4.6 06/15/2017   HgbA1c: Lab Results  Component Value Date   HGBA1C 6.9 (H) 06/15/2017   Magnesium:  Magnesium  Date Value Ref Range Status  05/22/2016 1.9 1.7 - 2.4 mg/dL Final     Radiology/Studies:  Ct Head Wo Contrast  Result Date: 07/24/2017 CLINICAL DATA:  Altered mental status.  Nausea and vomiting. EXAM: CT HEAD WITHOUT CONTRAST TECHNIQUE: Contiguous axial images were obtained from the base of the skull through the vertex without intravenous contrast. COMPARISON:  Brain MRI and head CT 06/14/2017 FINDINGS: Brain: Known recent infarcts in the right occipital lobe and temporoparietal region. No  visible hemorrhagic conversion. Small remote right inferior cerebellar infarct. No evidence of acute infarct, hemorrhage, hydrocephalus, or mass. Vascular: Atherosclerotic calcification.  No hyperdense vessel. Skull: No acute or aggressive finding Sinuses/Orbits: Bilateral cataract resection. IMPRESSION: 1. No acute finding. 2. Known recent right cerebral infarcts. Electronically Signed   By: Monte Fantasia M.D.   On: 07/24/2017 11:54   Dg Chest Port 1 View  Result Date: 07/24/2017 CLINICAL DATA:  Onset of shortness of breath and left-sided chest pain this morning. History of atrial fibrillation, diabetes, and hypertension. Nonsmoker. EXAM: PORTABLE CHEST 1 VIEW COMPARISON:  PA and lateral chest x-ray of July 11, 2016 FINDINGS: The lungs are adequately inflated. The interstitial markings are mildly increased. The cardiac silhouette is enlarged. The pulmonary vascularity is mildly prominent. There is no definite pleural effusion. The mediastinum is normal in width. The observed bony thorax exhibits no acute abnormality. There is an old fracture of the lateral aspect of the left third rib. IMPRESSION: Findings suggestive of low-grade CHF with mild interstitial edema. No alveolar pneumonia nor pneumothorax. Electronically Signed   By: David  Martinique M.D.   On: 07/24/2017 10:02    Assessment and Plan:    1.  Atrial fibrillation with RVR (HCC) - chronic atrial fib w/ RVR due to acute illness - IV Cardizem for rate control - continue home BB  2. Chronic diastolic CHF: - Pt weight is the same as it was when she was last admitted but son states po intake has been poor. - low-grade CHF on CXR and BNP is elevated above norm (200-350) - however, not much extra volume on exam and Cr is above baseline - she got Lasix 20 mg IV in the ER - follow sx and labs, she was not on a diuretic pta - may need some diuresis, would minimize IVF   Otherwise, per IM Active Problems:   DM2 (diabetes mellitus, type 2)  (HCC)   Rheumatoid arthritis (HCC)   Abnormal LFTs     For questions or updates, please contact [facility] Please consult [email or web address] for contact info under Cardiology/STEMI.   SignedRosaria Ferries, PA-C  07/24/2017 5:15 PM

## 2017-07-24 NOTE — Progress Notes (Signed)
The patient transferred from ED with A-fib on a Cardizem gtt; in NSR ~5pm; back to Afib ~1730. Barrett with cardiology on unit with MD Turner. Able to view strips with rhythm changes.   Patient had a ventricular rhythm ~1830; Barrett on unit and viewed strip.  I will continue to monitor the patient closely.   Saddie Benders RN

## 2017-07-24 NOTE — ED Notes (Addendum)
Called lab to ensure add-ons were received.

## 2017-07-24 NOTE — ED Notes (Signed)
Pt CT  

## 2017-07-24 NOTE — Progress Notes (Signed)
  Echocardiogram 2D Echocardiogram has been performed.  [name] 07/24/2017, 7:34 PM

## 2017-07-24 NOTE — Progress Notes (Signed)
Call Pager [phone] for any questions or notifications regarding this patient  FMTS Attending Admission Note: Christina Mcmurray MD Attending pager:[phone] [phone] I  have seen and examined this patient, reviewed their chart. I have discussed this patient with the resident. I agree with the resident's findings, assessment and care plan.  1. Mental status changes: unclear what her baseline (over last 2 months since CVA) truly is. She has been at a rehab facility. I am not sure how much intimate contact family has had. We will try calling the facility and see if they can give [place] an idea. She is actively hallucinating in ED---not distressed by it> reports seeing beautiful flowers in the room, a woman named Juliann Pulse (no one there) , and then later mentioned to me that it was obviously raining despite the fact that it was not. Differential is broad and will take some time to tease out. Will help if we can determine most recent baseline. 2. A fib RVR: diltiazem gtt. Cardiology consult.

## 2017-07-24 NOTE — ED Triage Notes (Signed)
Pt here via [facility] from [facility] for acute onset chest pain and sob.  HR 172, given 5 mg metoprolol with no change in HR.  Sats dropped to 88% on RA and increased to 95% on 2 L.  Dry heaving on arrival.

## 2017-07-24 NOTE — H&P (Addendum)
[facility] Admission History and Physical Service Pager: [phone]  Patient name: [name] Medical record number: [id number] Date of birth: 09-Nov-1945 Age: 72 y.o. Gender: female  Primary Care Provider: Aletha Halim., PA-C Consultants: Cardiology Code Status: Partial (DNI)  Chief Complaint: heart palpitations  Assessment and Plan: [name] is a 72 y.o. female presenting with atrial fibrillation with RVR. PMH is significant for atrial fibrillation, altered mental status, recent embolic stroke, CKD3, HFpEF, HTN, HLD, T2DM, GERD, RA, Anxiety/Depression, frequent falls.  Atrial fibrillation with RVR  Patient has been experiencing heart palpitations and was seen at [place] on 4/22, who recommended that she go to the ED for atrial fibrillation with RVR.  This is a chronic issue for her, and her home meds include Cardizem 24 hr capsule 360 mg daily, Lopressor 100 mg BID, and Xarelto 20 mg daily.  However, she did not receive these medications this morning.  Per cardiology notes, appears patient may not have been taking lopressor yesterday due to dec PO. Heart rate was 161 on arrival to the ED, and she was placed on a Cardizem drip.  HR decreased to 110 bmp while on Cardizem drip at time of admission.   - admit to stepdown, attending Dr. Nori [name] - consult cardiology, appreciate recommendations>> Cardizem gtt continued, PO lopressor changed to IV to titrate for HR control while not taking PO, recheck cardiac echo to rule out tachycardia-induced cardiomyopathy - cardiology recommends >> Xarelto transition to heparin gtt per pharm due to patient not taking PO - TSH - follow up cardiac echo  Altered Mental Status Family and SNF nurse report that patient has been mildly to severely confused with accompanying hallucinations since her stroke on 06/15/17.  Her nurse reports that her confusion has not increased recently.  Patient and family report  that prior to stroke, patient was living with boyfriend and performing ADLs and IADLs independently.  A MoCA was done during her hospitalization for her stroke and was 10/30.  She was seeing plants in pots in the room. She also sees someone named "Juliann Pulse" who family does not know.  Differential includes trauma, delirium, vascular dementia, Alzheimer's dementia, hypoxia, hypercarbia, hypoglycemia, stroke, depression with psychosis, infection, or a combination of the above.  Patient afebrile with unimpressive UA and no symptoms of UTI.  Glucose was 59 on admission, has since been given D50.  On O2 but no desaturations recorded.  Bicarb 14.  CT head negative.  The most likely diagnosis is vascular dementia given acute worsening after her stroke. - SLP evaluation (SNF nurse reports patient will choke if she takes multiple meds at once)  - recheck glucose  - UDS - hold gabapentin due to patient's sleepiness - continue Wellbutrin 150 mg BID  Chest pain Patient has reported chest pain over the past three days with accompanying nausea.  Likely due to heart palpitations, but will obtain troponin and EKG. - appreciate cardiology recommendations - f/u troponins  - f/u EKG  Elevated Liver enzymes - uncertain etiology. Patient endorses vomiting x1. May be due to intravascular depletion vs hepatic congestion from CHF. Will check hepatitis panel. Follow up AM CMP.  HFpEF Patient could have a CHF exacerbation due to atrial fibrillation with RVR.  Last ECHO 06/16/17 with EF 60-65%, and calcified mitral valve annulus, as well as mildly thickened leaflets with mild to moderate mitral regurgitation. Trace edema to bilateral mid shins.  No crackles heard on lung exam.  CXR notable for mild  interstitial edema with enlarged cardiac silhouette.  BNP elevated from baseline at 670.9 (baseline appears to be 200-250). - Lasix 20 mg once  - strict I's and O's - daily weights  AKI Patient with creatinine of 1.62 at admission,  baseline appears to be about 0.70-0.80.  This is likely prerenal in the setting of dehydration/intravascular depletion.  Patient has not been eating or drinking well and appears dry on exam. - repeat CMP in am - monitor with diuresis  Abdominal pain, possibly 2/2 GERD Patient reported epigastric pain over the past three days with a loss of appetite.  Had nausea with some vomiting on 4/21.  Has a history of GERD for which she takes Nexium 40 mg daily.  Abdominal exam was benign, nontender to palpation in RUQ.  LFTs were elevated with AST 502, ALT 482, alk phos slightly elevated to 132, bilirubin slightly elevated to 1.3.  Elevations could be due to viral hepatitis, biliary obstruction, or AKI - hepatitis panel - am CMP  HTN Chronic. Blood pressures have ranged from 98/71 - 168/91 since admission.  Home medications include metoprolol tartrate 1103m BID and diltiazem 3637mdaily - continue Lopressor 100 mg BID - hold diltiazem for now; will add back pending cardiology recs and as drip is titrated down  Type II DM Last A1c from March 2019 was 6.9.  Patient takes Amaryl at home. - Hold home Amaryl - Monitor CBG, can add SSI if needed  HLD Lipid panel from March 2019 with total cholesterol 143, HDL 31, LDL 66, TG 229 - hold home Crestor in setting of elevated LFTs  Rheumatoid arthritis  Chronic. Stable.  Patient takes ArKeith Rakend prednisone at home - Continue home Arava 20 mg daily and prednisone 5 mg daily - Continue Orencia injection q Thursday if patient still in hospital  Anxiety/Depression Takes Wellbutrin 150 mg BID at home.  Unknown if patient has exhibited post-stroke depression. - continue home Wellbutrin  Chronic anemia Takes Fe supplement at home.  Hgb 12.3, which is slightly above her baseline, likely heme-concentrated due to dehydration. - continue to monitor - continue home Fe  Frequent falls Patient has sustained multiple falls in the past few months, with her  last fall about one week ago resulting in a broken R second toe.  Patient uses walker for ambulation.  CT head negative for bleed. - up only with assistance  FEN/GI: NPO until cleared by speech Prophylaxis: Xarelto  Disposition: admit to stepdown  History of Present Illness:  Fradel L RuHysers a [age].o. female presenting with palpitations and chest pain for the last 3 days. This was associated with labored breathing and improved with rubbing ice on her chest.  She does endorse chest pain over the past two days below her left breast, non-radiating, improved with topical ice, noted when she was not moving, may have been associated with eating. She had associated nausea yesterday with one episode of NBNB emesis. Family at bedside endorses some confusion worsened from baseline with taking clothes off at her rehab facility, although nurse at facility said that her confusion has been constant and has not worsened over the past three days.  Patient says she has needed three pillows to sleep on during the night, but her facility nurse says that she has only used one pillow.  Her nurse says that she has had waxing and waning confusion but has been accurate about her heart palpitations and recognized when she was in RVR.  **Patient is poor  historian given altered mental status, but history was supplemented by family and SNF nurse  Review Of Systems: Per HPI with the following additions:  Review of Systems  Constitutional: Positive for chills. Negative for fever.  Respiratory: Positive for shortness of breath. Negative for cough.   Cardiovascular: Positive for chest pain, palpitations, orthopnea and PND. Negative for leg swelling.  Gastrointestinal: Positive for abdominal pain, heartburn, nausea and vomiting. Negative for constipation and diarrhea.  Genitourinary: Negative for dysuria, frequency and urgency.  Musculoskeletal: Positive for falls.  Neurological: Negative for sensory change, speech change,  focal weakness and loss of consciousness.  Endo/Heme/Allergies: Does not bruise/bleed easily.  Psychiatric/Behavioral: Positive for hallucinations.    Patient Active Problem List   Diagnosis Date Noted  . Stroke, acute, embolic ([place]) 25/49/8264  . Mitral regurgitation 06/17/2017  . Acute CVA (cerebrovascular accident) ([place])   . Thyroid nodule 06/15/2017  . Atrial fibrillation with RVR ([place]) 06/14/2017  . CVA (cerebral vascular accident) ([place]) 06/14/2017  . Type II diabetes mellitus ([place])   . Peripheral vascular disease ([place])   . Hyperlipidemia   . Hiatal hernia   . Edema   . Chronic diastolic (congestive) heart failure ([place])   . Carpal tunnel syndrome   . Atrial fibrillation ([place])   . Anemia   . Actinic keratosis   . Intractable nausea and vomiting 05/31/2016  . Nausea & vomiting 05/30/2016  . Abnormal chest x-ray 05/30/2016  . Diabetes mellitus with complication ([place])   . Influenza A 05/21/2016  . Chronic atrial fibrillation ([place]) 05/20/2016  . Acute CHF (congestive heart failure) ([place]) 05/20/2016  . History of GI bleed 03/29/2016  . Chronic anticoagulation 03/29/2016  . Symptomatic anemia 03/19/2016  . Spinal stenosis of lumbar region 02/10/2016  . Varicose veins of left leg with edema 09/15/2015  . Preop cardiovascular exam 08/25/2015  . Iron deficiency anemia due to chronic blood loss 07/17/2015  . Anemia of chronic renal failure, stage 3 (moderate) ([place]) 07/17/2015  . Anticoagulant causing adverse effect in therapeutic use 07/17/2015  . Symptomatic bradycardia 02/21/2015  . Bradycardia   . Acute deep vein thrombosis (DVT) of left lower extremity ([place]) 05/06/2014  . Abdominal wall mass of right lower quadrant 07/08/2013  . OA (osteoarthritis) of hip 04/03/2013  . Osteoarthritis of hip 01/18/2013  . Night sweats 09/06/2012  . Chest pain   . Anxiety and depression   . Gastroesophageal reflux disease   . Hepatitis   . Jaundice   . Hypertension 07/01/2010  .  Hypercholesterolemia 07/01/2010  . DM2 (diabetes mellitus, type 2) ([place]) 07/01/2010  . Rheumatoid arthritis ([place]) 07/01/2010  . Sinus tachycardia 07/01/2010  . Chest pain, atypical 07/01/2010    Past Medical History: Past Medical History:  Diagnosis Date  . Actinic keratosis   . Anemia    hx of  . Anemia of chronic renal failure, stage 3 (moderate) ([place]) 07/17/2015  . Anticoagulant causing adverse effect in therapeutic use 07/17/2015  . Atrial fibrillation ([place])    a. persistent, she remains on Xarelto  . Carpal tunnel syndrome   . Chronic diastolic (congestive) heart failure ([place])    a. 04/2016: echo showing a preserved EF of 55-60% with mild MR. LA and RA midly dilated.   . Edema    left leg at ankle resolved now  . Gastroesophageal reflux disease   . Hepatitis 1970   not sure what kind  . Hiatal hernia   . Hyperlipidemia   . Hypertension   . Iron deficiency anemia  due to chronic blood loss 07/17/2015  . Jaundice    age 61  . Peripheral vascular disease ([place]) yrs ago   DVT left lower leg questionale told by 2 drs i had no clot, 1 md said i did  . Rheumatoid arthritis(714.0)   . Right knee pain   . Type II diabetes mellitus ([place])    type2    Past Surgical History: Past Surgical History:  Procedure Laterality Date  . CATARACT EXTRACTION Bilateral   . COLONOSCOPY WITH PROPOFOL N/A 02/20/2015   Procedure: COLONOSCOPY WITH PROPOFOL;  Surgeon: Carol Ada, MD;  Location: [facility];  Service: Endoscopy;  Laterality: N/A;  . ENTEROSCOPY N/A 03/23/2016   Procedure: ENTEROSCOPY;  Surgeon: Carol Ada, MD;  Location: [facility];  Service: Endoscopy;  Laterality: N/A;  . ENTEROSCOPY N/A 06/17/2016   Procedure: ENTEROSCOPY;  Surgeon: Carol Ada, MD;  Location: [facility];  Service: Endoscopy;  Laterality: N/A;  . ESOPHAGOGASTRODUODENOSCOPY (EGD) WITH PROPOFOL N/A 02/20/2015   Procedure: ESOPHAGOGASTRODUODENOSCOPY (EGD) WITH PROPOFOL;  Surgeon: Carol Ada, MD;  Location:  [facility];  Service: Endoscopy;  Laterality: N/A;  . GIVENS CAPSULE STUDY N/A 03/21/2016   Procedure: GIVENS CAPSULE STUDY;  Surgeon: Carol Ada, MD;  Location: [facility];  Service: Endoscopy;  Laterality: N/A;  . HAND SURGERY  1995 and 1996   artificial joints both hands  . JOINT REPLACEMENT    . LUMBAR LAMINECTOMY/DECOMPRESSION MICRODISCECTOMY N/A 02/10/2016   Procedure: MICROLUMBAR DECOMPRESSION L4-L5 AND L3- L4, AND EXCISION OF SYNOVIAL CYST L4-L5;  Surgeon: Susa Day, MD;  Location: [facility];  Service: Orthopedics;  Laterality: N/A;  . TOTAL HIP ARTHROPLASTY Left 2009  . TOTAL HIP ARTHROPLASTY Right 04/03/2013   Procedure: RIGHT TOTAL HIP ARTHROPLASTY ANTERIOR APPROACH;  Surgeon: Gearlean Alf, MD;  Location: [facility];  Service: Orthopedics;  Laterality: Right;    Social History: Social History   Tobacco Use  . Smoking status: Never Smoker  . Smokeless tobacco: Never Used  Substance Use Topics  . Alcohol use: No    Alcohol/week: 0.0 oz  . Drug use: No   Additional social history:  Please also refer to relevant sections of EMR.  Family History: Family History  Problem Relation Age of Onset  . Stroke Father   . Heart failure Sister   . Hypertension Sister   . Heart attack Neg Hx    (If not completed, MUST add something in)  Allergies and Medications: No Known Allergies No current facility-administered medications on file prior to encounter.    Current Outpatient Medications on File Prior to Encounter  Medication Sig Dispense Refill  . Abatacept (ORENCIA CLICKJECT) 287 MG/ML SOAJ Inject 125 mg into the skin every Thursday.     [name] acetaminophen (TYLENOL) 325 MG tablet Take 1-2 tablets (325-650 mg total) by mouth every 4 (four) hours as needed for mild pain.    [name] buPROPion (WELLBUTRIN SR) 150 MG 12 hr tablet Take 150 mg by mouth 2 (two) times daily.      . Cholecalciferol (VITAMIN D3) 1000 units CAPS Take 1,000 Units by mouth every evening.     . diltiazem  (CARDIZEM CD) 360 MG 24 hr capsule TAKE 1 CAPSULE (360 MG TOTAL) BY MOUTH DAILY. 90 capsule 3  . esomeprazole (NEXIUM) 40 MG capsule Take 40 mg by mouth every morning.     . ferrous gluconate (FERGON) 324 MG tablet Take 1 tablet (324 mg total) by mouth 2 (two) times daily with a meal. 60 tablet 3  .  gabapentin (NEURONTIN) 100 MG capsule Take 1 capsule (100 mg total) by mouth at bedtime.    [name] glimepiride (AMARYL) 2 MG tablet Take 1 tablet (2 mg total) by mouth daily with breakfast.    . leflunomide (ARAVA) 20 MG tablet Take 20 mg by mouth daily.    . meclizine (ANTIVERT) 12.5 MG tablet Take 12.5 mg by mouth every 4 (four) hours as needed for dizziness (Nausea).    . metoprolol tartrate (LOPRESSOR) 100 MG tablet Take 1 tablet (100 mg total) by mouth 2 (two) times daily.    . Multiple Vitamins-Minerals (PRESERVISION AREDS 2) CAPS Take 1 capsule by mouth 2 (two) times daily.    . nitroGLYCERIN (NITROSTAT) 0.4 MG SL tablet Place 1 tablet (0.4 mg total) under the tongue every 5 (five) minutes as needed for chest pain (x 3 doses). 25 tablet 2  . potassium chloride SA (K-DUR,KLOR-CON) 20 MEQ tablet Take 1 tablet (20 mEq total) by mouth daily.    . predniSONE (DELTASONE) 5 MG tablet Take 5 mg by mouth daily.    . rivaroxaban (XARELTO) 20 MG TABS tablet Take 1 tablet (20 mg total) by mouth daily with supper. 30 tablet   . rosuvastatin (CRESTOR) 40 MG tablet Take 1 tablet (40 mg total) by mouth every evening.    . senna-docusate (SENOKOT-S) 8.6-50 MG tablet Take 1 tablet by mouth at bedtime as needed for mild constipation.    . vitamin B-12 1000 MCG tablet Take 1 tablet (1,000 mcg total) by mouth every evening.    [name] VOLTAREN 1 % GEL Apply 2 g topically at bedtime as needed for pain. Knees, calf  2    Objective: BP (!) 168/91   Pulse (!) 116   Temp 99.2 F (37.3 C) (Rectal)   Resp (!) 28   SpO2 98%  Exam: General: appears comfortable, waxing and waning alertness Eyes: EOMI, PERRL ENTM: mucous  membranes dry, oropharynx clear Neck: supple, nontender Cardiovascular: irregularly irregular, +tachycardia, no murmurs Respiratory: CTAB, no crackles, comfortable work of breathing, speaks in full sentences Gastrointestinal: nontender to palpation, nondistended MSK: ecchymosis over right toe and in arch of R foot, trace edema to mid shin bilaterally Derm: no bruising noted Neuro: CN II-XII grossly intact, 5/5 strength in upper and lower extremities Psych: Oriented to self and year. States it is March. States there are "plants everywhere" so feels we must be in a plant place. States the plants are beautiful. When asked to name everyone in the room she hallucinates additional people including someone named Juliann Pulse.  Labs and Imaging: CBC BMET  Recent Labs  Lab 07/24/17 0935 07/24/17 1007  WBC 11.6*  --   HGB 12.3 13.6  HCT 40.2 40.0  PLT 237  --    Recent Labs  Lab 07/24/17 0935 07/24/17 1007  NA 135 139  K 4.7 4.6  CL 103 106  CO2 14*  --   BUN 32* 39*  CREATININE 1.62* 1.60*  GLUCOSE 64* 59*  CALCIUM 8.6*  --      Ct Head Wo Contrast  Result Date: 07/24/2017 CLINICAL DATA:  Altered mental status.  Nausea and vomiting. EXAM: CT HEAD WITHOUT CONTRAST TECHNIQUE: Contiguous axial images were obtained from the base of the skull through the vertex without intravenous contrast. COMPARISON:  Brain MRI and head CT 06/14/2017 FINDINGS: Brain: Known recent infarcts in the right occipital lobe and temporoparietal region. No visible hemorrhagic conversion. Small remote right inferior cerebellar infarct. No evidence of acute infarct, hemorrhage, hydrocephalus,  or mass. Vascular: Atherosclerotic calcification.  No hyperdense vessel. Skull: No acute or aggressive finding Sinuses/Orbits: Bilateral cataract resection. IMPRESSION: 1. No acute finding. 2. Known recent right cerebral infarcts. Electronically Signed   By: Monte Fantasia M.D.   On: 07/24/2017 11:54   Dg Chest Port 1 View  Result  Date: 07/24/2017 CLINICAL DATA:  Onset of shortness of breath and left-sided chest pain this morning. History of atrial fibrillation, diabetes, and hypertension. Nonsmoker. EXAM: PORTABLE CHEST 1 VIEW COMPARISON:  PA and lateral chest x-ray of July 11, 2016 FINDINGS: The lungs are adequately inflated. The interstitial markings are mildly increased. The cardiac silhouette is enlarged. The pulmonary vascularity is mildly prominent. There is no definite pleural effusion. The mediastinum is normal in width. The observed bony thorax exhibits no acute abnormality. There is an old fracture of the lateral aspect of the left third rib. IMPRESSION: Findings suggestive of low-grade CHF with mild interstitial edema. No alveolar pneumonia nor pneumothorax. Electronically Signed   By: David  Martinique M.D.   On: 07/24/2017 10:02     Kathrene Alu, MD 07/24/2017, 1:03 PM PGY-1, [place] Intern pager: [phone], text pages welcome  I have separately seen and examined the patient. I have discussed the findings and exam with Dr. Shan Levans and agree with the above note.  My changes/additions are outlined in [facility].   Everrett Coombe, MD PGY-2 [facility]

## 2017-07-25 DIAGNOSIS — Z8673 Personal history of transient ischemic attack (TIA), and cerebral infarction without residual deficits: Secondary | ICD-10-CM

## 2017-07-25 DIAGNOSIS — R945 Abnormal results of liver function studies: Secondary | ICD-10-CM

## 2017-07-25 DIAGNOSIS — I5032 Chronic diastolic (congestive) heart failure: Secondary | ICD-10-CM

## 2017-07-25 DIAGNOSIS — I4891 Unspecified atrial fibrillation: Secondary | ICD-10-CM

## 2017-07-25 DIAGNOSIS — R443 Hallucinations, unspecified: Secondary | ICD-10-CM

## 2017-07-25 LAB — COMPREHENSIVE METABOLIC PANEL
ALBUMIN: 2.8 g/dL — AB (ref 3.5–5.0)
ALT: 428 U/L — ABNORMAL HIGH (ref 14–54)
ANION GAP: 12 (ref 5–15)
AST: 380 U/L — ABNORMAL HIGH (ref 15–41)
Alkaline Phosphatase: 126 U/L (ref 38–126)
BUN: 25 mg/dL — ABNORMAL HIGH (ref 6–20)
CHLORIDE: 106 mmol/L (ref 101–111)
CO2: 21 mmol/L — AB (ref 22–32)
Calcium: 8.5 mg/dL — ABNORMAL LOW (ref 8.9–10.3)
Creatinine, Ser: 2.07 mg/dL — ABNORMAL HIGH (ref 0.44–1.00)
GFR calc non Af Amer: 23 mL/min — ABNORMAL LOW (ref >60)
GFR, EST AFRICAN AMERICAN: 26 mL/min — AB (ref >60)
GLUCOSE: 85 mg/dL (ref 65–99)
POTASSIUM: 2.6 mmol/L — AB (ref 3.5–5.1)
Sodium: 139 mmol/L (ref 135–145)
Total Bilirubin: 1.5 mg/dL — ABNORMAL HIGH (ref 0.3–1.2)
Total Protein: 5.5 g/dL — ABNORMAL LOW (ref 6.5–8.1)

## 2017-07-25 LAB — BASIC METABOLIC PANEL
Anion gap: 13 (ref 5–15)
BUN: 22 mg/dL — ABNORMAL HIGH (ref 6–20)
CALCIUM: 8.1 mg/dL — AB (ref 8.9–10.3)
CO2: 19 mmol/L — ABNORMAL LOW (ref 22–32)
Chloride: 105 mmol/L (ref 101–111)
Creatinine, Ser: 1.43 mg/dL — ABNORMAL HIGH (ref 0.44–1.00)
GFR, EST AFRICAN AMERICAN: 41 mL/min — AB (ref >60)
GFR, EST NON AFRICAN AMERICAN: 36 mL/min — AB (ref >60)
GLUCOSE: 239 mg/dL — AB (ref 65–99)
POTASSIUM: 3.3 mmol/L — AB (ref 3.5–5.1)
Sodium: 137 mmol/L (ref 135–145)

## 2017-07-25 LAB — CBC
HCT: 39 % (ref 36.0–46.0)
Hemoglobin: 11.9 g/dL — ABNORMAL LOW (ref 12.0–15.0)
MCH: 24.9 pg — ABNORMAL LOW (ref 26.0–34.0)
MCHC: 30.5 g/dL (ref 30.0–36.0)
MCV: 81.6 fL (ref 78.0–100.0)
Platelets: 186 10*3/uL (ref 150–400)
RBC: 4.78 MIL/uL (ref 3.87–5.11)
RDW: 20.8 % — ABNORMAL HIGH (ref 11.5–15.5)
WBC: 9.2 10*3/uL (ref 4.0–10.5)

## 2017-07-25 LAB — GLUCOSE, CAPILLARY
GLUCOSE-CAPILLARY: 238 mg/dL — AB (ref 65–99)
GLUCOSE-CAPILLARY: 42 mg/dL — AB (ref 65–99)
GLUCOSE-CAPILLARY: 53 mg/dL — AB (ref 65–99)
GLUCOSE-CAPILLARY: 55 mg/dL — AB (ref 65–99)
Glucose-Capillary: 47 mg/dL — ABNORMAL LOW (ref 65–99)
Glucose-Capillary: 83 mg/dL (ref 65–99)
Glucose-Capillary: 120 mg/dL — ABNORMAL HIGH (ref 65–99)
Glucose-Capillary: 82 mg/dL (ref 65–99)
Glucose-Capillary: 278 mg/dL — ABNORMAL HIGH (ref 65–99)

## 2017-07-25 LAB — HEPATITIS PANEL, ACUTE
HCV AB: 0.2 {s_co_ratio} (ref 0.0–0.9)
HEP A IGM: NEGATIVE
HEP B C IGM: NEGATIVE
Hepatitis B Surface Ag: NEGATIVE

## 2017-07-25 LAB — ECHOCARDIOGRAM COMPLETE
Height: 67 in
Weight: 2843.05 [oz_av]

## 2017-07-25 LAB — TROPONIN I
TROPONIN I: 0.03 ng/mL — AB (ref <0.03)
TROPONIN I: 0.09 ng/mL — AB (ref <0.03)
Troponin I: 0.06 ng/mL
Troponin I: 0.06 ng/mL (ref <0.03)

## 2017-07-25 LAB — HEPARIN LEVEL (UNFRACTIONATED): Heparin Unfractionated: 0.51 IU/mL (ref 0.30–0.70)

## 2017-07-25 LAB — APTT: aPTT: 60 seconds — ABNORMAL HIGH (ref 24–36)

## 2017-07-25 MED ORDER — POTASSIUM CITRATE-CITRIC ACID 1100-334 MG/5ML PO SOLN: 30.0000 meq | Freq: Three times a day (TID) | ORAL | Status: DC

## 2017-07-25 MED ORDER — DEXTROSE 50 % IV SOLN
INTRAVENOUS | Status: AC
  Administered 2017-07-25: 50 mL via INTRAVENOUS
  Filled 2017-07-25: qty 50

## 2017-07-25 MED ORDER — METOPROLOL TARTRATE 100 MG PO TABS
100.0000 mg | ORAL_TABLET | Freq: Two times a day (BID) | ORAL | Status: DC
  Administered 2017-07-25 – 2017-07-27 (×4): 100 mg via ORAL
  Filled 2017-07-25 (×4): qty 1

## 2017-07-25 MED ORDER — POTASSIUM CHLORIDE 10 MEQ/100ML IV SOLN
10.0000 meq | INTRAVENOUS | Status: DC
  Administered 2017-07-25 (×3): 10 meq via INTRAVENOUS
  Filled 2017-07-25 (×5): qty 100

## 2017-07-25 MED ORDER — HEPARIN BOLUS VIA INFUSION
1000.0000 [IU] | Freq: Once | INTRAVENOUS | Status: AC
  Administered 2017-07-25: 1000 [IU] via INTRAVENOUS
  Filled 2017-07-25: qty 1000

## 2017-07-25 MED ORDER — POTASSIUM CHLORIDE 10 MEQ/100ML IV SOLN
10.0000 meq | INTRAVENOUS | Status: DC
  Administered 2017-07-25: 10 meq via INTRAVENOUS
  Filled 2017-07-25 (×3): qty 100

## 2017-07-25 MED ORDER — POTASSIUM CHLORIDE 20 MEQ/15ML (10%) PO SOLN
40.0000 meq | Freq: Once | ORAL | Status: AC
  Administered 2017-07-25: 40 meq via ORAL
  Filled 2017-07-25: qty 30

## 2017-07-25 MED ORDER — INSULIN ASPART 100 UNIT/ML ~~LOC~~ SOLN
0.0000 [IU] | Freq: Three times a day (TID) | SUBCUTANEOUS | Status: DC
  Administered 2017-07-26 (×2): 3 [IU] via SUBCUTANEOUS
  Administered 2017-07-27: 5 [IU] via SUBCUTANEOUS

## 2017-07-25 MED ORDER — DEXTROSE 50 % IV SOLN
INTRAVENOUS | Status: AC
  Administered 2017-07-25: 50 mL
  Filled 2017-07-25: qty 50

## 2017-07-25 MED ORDER — HEPARIN (PORCINE) IN NACL 100-0.45 UNIT/ML-% IJ SOLN
1100.0000 [IU]/h | INTRAMUSCULAR | Status: DC
  Administered 2017-07-25 (×2): 1100 [IU]/h via INTRAVENOUS
  Filled 2017-07-25 (×2): qty 250

## 2017-07-25 MED ORDER — PHENOL 1.4 % MT LIQD: 1.0000 | OROMUCOSAL | Status: DC | PRN

## 2017-07-25 MED ORDER — QUETIAPINE FUMARATE 25 MG PO TABS
25.0000 mg | ORAL_TABLET | Freq: Three times a day (TID) | ORAL | Status: DC | PRN
Start: 2017-07-25 — End: 2017-07-27
  Administered 2017-07-27: 25 mg via ORAL
  Filled 2017-07-25: qty 1

## 2017-07-25 MED ORDER — HEPARIN BOLUS VIA INFUSION
4000.0000 [IU] | Freq: Once | INTRAVENOUS | Status: DC
  Filled 2017-07-25: qty 4000

## 2017-07-25 MED ORDER — INSULIN ASPART 100 UNIT/ML ~~LOC~~ SOLN
0.0000 [IU] | Freq: Every day | SUBCUTANEOUS | Status: DC
  Administered 2017-07-25: 3 [IU] via SUBCUTANEOUS

## 2017-07-25 MED ORDER — DEXTROSE-NACL 5-0.45 % IV SOLN
INTRAVENOUS | Status: DC
  Administered 2017-07-25: 03:00:00 via INTRAVENOUS

## 2017-07-25 MED ORDER — SALINE SPRAY 0.65 % NA SOLN
1.0000 | NASAL | Status: DC | PRN
  Filled 2017-07-25: qty 44

## 2017-07-25 NOTE — Clinical Social Work Note (Signed)
Clinical Social Work Assessment  Patient Details  Name: [name] MRN: [id number] Date of Birth: Nov 17, 1945  Date of referral:  07/25/17               Reason for consult:  Discharge Planning, Facility Placement                Permission sought to share information with:  Family Supports, Customer service manager Permission granted to share information::  No(patient disoriented)  Name::     [name]  Agency::  SNFs  Relationship::  son  Contact Information:  [phone]  Housing/Transportation Living arrangements for the past 2 months:  Single Family Home Source of Information:  Adult Children Patient Interpreter Needed:  None Criminal Activity/Legal Involvement Pertinent to Current Situation/Hospitalization:  No - Comment as needed Significant Relationships:  Adult Children Lives with:  Adult Children Do you feel safe going back to the place where you live?  Yes Need for family participation in patient care:  Yes (Comment)  Care giving concerns: Patient originally from home with son. Recently discharged from inpatient rehab ([facility]) to [facility]. PT recommending SNF.   Social Worker assessment / plan: CSW spoke to patient's son, Fara Olden, via phone. Patient is disoriented. CSW explained PT recommendations and insurance coverage considerations for return to SNF. Patient used 20 Medicare days at [facility], and would have co-pays if she returns. Son and his fiance Neoma Laming have been attempting to follow up on long term Medicaid and CAPS program through [facility], but have had difficulty in reaching a case worker there.   CSW confirmed with [facility] that if patient's Medicaid is not long term, it will not cover SNF benefits, and her Medicare will be billed instead (with co-pays). CSW called patient's son again and also spoke to his fiance Neoma Laming about the coverage options. CSW advised Neoma Laming and Fara Olden to call Medicaid and verify if patient has long term  care benefits.   [facility] has a bed available for patient to return, if family agreeable to pay co-pays or if Medicaid can be billed. CSW to continue to follow and will support with disposition planning.   Employment status:  Retired Forensic scientist:  Medicaid In [place], [place] PT Recommendations:  [place] / Referral to community resources:  [place]  Patient/Family's Response to care: Family appreciative of care.  Patient/Family's Understanding of and Emotional Response to Diagnosis, Current Treatment, and Prognosis: Family with questions about patient's condition and how long she will be in the hospital, as they are trying to arrange a long term care plan for patient, whether at facility or at home.  Emotional Assessment Appearance:  Appears stated age Attitude/Demeanor/Rapport:  Unable to Assess Affect (typically observed):  Unable to Assess Orientation:  (disoriented x4) Alcohol / Substance use:  Not Applicable Psych involvement (Current and /or in the community):  No (Comment)  Discharge Needs  Concerns to be addressed:  Care Coordination, Discharge Planning Concerns Readmission within the last 30 days:  Yes Current discharge risk:  Physical Impairment, Cognitively Impaired Barriers to Discharge:  Continued Medical Work up   Estanislado Emms, LCSW 07/25/2017, 3:53 PM

## 2017-07-25 NOTE — Progress Notes (Addendum)
ANTICOAGULATION CONSULT NOTE - Initial Consult  Pharmacy Consult for heparin Indication: atrial fibrillation  No Known Allergies  Patient Measurements: Height: 5\' 7"  (170.2 cm) Weight: 177 lb 11.1 oz (80.6 kg)(NT took pt bed weight twice.) IBW/kg (Calculated) : 61.6 Heparin Dosing Weight: 78 kg  Vital Signs: Temp: 99.2 F (37.3 C) (04/23 0003) Temp Source: Axillary (04/23 0003) BP: 113/76 (04/23 0003) Pulse Rate: 74 (04/23 0003)  Labs: Recent Labs    07/24/17 0935 07/24/17 1007 07/24/17 1436 07/24/17 1852 07/25/17 0024  HGB 12.3 13.6  --   --   --   HCT 40.2 40.0  --   --   --   PLT 237  --   --   --   --   CREATININE 1.62* 1.60*  --   --   --   TROPONINI  --   --  0.07* 0.09* 0.09*    Estimated Creatinine Clearance: 34.7 mL/min (A) (by C-G formula based on SCr of 1.6 mg/dL (H)).   Medical History: Past Medical History:  Diagnosis Date  . Abnormal LFTs 07/24/2017  . Actinic keratosis   . Anemia    hx of  . Anemia of chronic renal failure, stage 3 (moderate) ([name]) 07/17/2015  . Anticoagulant causing adverse effect in therapeutic use 07/17/2015  . Anxiety   . Arthritis    rheumatoid  . Atrial fibrillation ([place])    a. persistent, she remains on Xarelto  . Carpal tunnel syndrome   . Chronic diastolic (congestive) heart failure ([place])    a. 04/2016: echo showing a preserved EF of 55-60% with mild MR. LA and RA midly dilated.   . Edema    left leg at ankle resolved now  . Gastroesophageal reflux disease   . Hepatitis 1970   not sure what kind  . Hiatal hernia   . Hyperlipidemia   . Hypertension   . Iron deficiency anemia due to chronic blood loss 07/17/2015  . Jaundice    age 40  . Peripheral vascular disease ([place]) yrs ago   DVT left lower leg questionale told by 2 drs i had no clot, 1 md said i did  . Rheumatoid arthritis(714.0)   . Right knee pain   . Stroke ([place])   . Type II diabetes mellitus ([place])    type2    Medications:  She was on xarelto PTA.    Last dose 4/21 at 17:45.  Will start heparin drip with small bolus.  Assessment:  Will start heparin drip with small bolus.  Will need to monitor aPTT and heparin levels initially. Goal of Therapy:  APTT 66-102 sec Heparin level 0.3-0.7 units/ml Monitor platelets by anticoagulation protocol: Yes   Plan:  Start heparin drip at 1100 units/hr after 1000 unit bolus Check aPTT and heparin level 6 hours after start and daily while on heparin Monitor for bleeding complications  [name] 07/25/2017,2:18 AM

## 2017-07-25 NOTE — Progress Notes (Signed)
ANTICOAGULATION CONSULT NOTE - Follow Up Consult  Pharmacy Consult for Heparin Indication: afib with recent CVA  No Known Allergies  Patient Measurements: Height: 5\' 7"  (170.2 cm) Weight: 174 lb 2.6 oz (79 kg) IBW/kg (Calculated) : 61.6 Heparin Dosing Weight:   Vital Signs: Temp: 98.2 F (36.8 C) (04/23 1154) Temp Source: Oral (04/23 1154) BP: 140/80 (04/23 1154) Pulse Rate: 105 (04/23 1154)  Labs: Recent Labs    07/24/17 0935 07/24/17 1007  07/24/17 1852 07/25/17 0024 07/25/17 0552 07/25/17 0909  HGB 12.3 13.6  --   --   --  11.9*  --   HCT 40.2 40.0  --   --   --  39.0  --   PLT 237  --   --   --   --  186  --   APTT  --   --   --   --   --   --  60*  HEPARINUNFRC  --   --   --   --   --   --  0.51  CREATININE 1.62* 1.60*  --   --   --  2.07*  --   TROPONINI  --   --    < > 0.09* 0.09* 0.06*  --    < > = values in this interval not displayed.    Estimated Creatinine Clearance: 26.6 mL/min (A) (by C-G formula based on SCr of 2.07 mg/dL (H)).   Assessment:  Anticoag:Xarelto PTA. Afib with recent CVA. Hgb 11.9 stable. Plts 186 down from previous (baseline 200's) - apTT 60, HL 0.51 in goal.   Goal of Therapy:  Heparin level 0.3-0.7 units/ml Monitor platelets by anticoagulation protocol: Yes   Plan:  Continue IV heparin at 1100 units/hr Daily HL and CBC D/c aPTT's  Xochil Shanker S. Alford Highland, PharmD, BCPS Clinical Staff Pharmacist Pager [phone]  Eilene Ghazi Stillinger 07/25/2017,12:18 PM

## 2017-07-25 NOTE — NC FL2 (Signed)
[place] MEDICAID FL2 LEVEL OF CARE SCREENING TOOL     IDENTIFICATION  Patient Name: [name] Birthdate: 04-03-1946 Sex: female Admission Date (Current Location): 07/24/2017  [facility] and [place] Number:  Herbalist and Address:  [facility], [place] [address], [place], [place] [address]      Provider Number: [id number]  Attending Physician Name and Address:  Dickie La, MD  Relative Name and Phone Number:  [name], son, [phone]    Current Level of Care: Hospital Recommended Level of Care: [place] Prior Approval Number:    Date Approved/Denied:   PASRR Number: [id number]  Discharge Plan: SNF    Current Diagnoses: Patient Active Problem List   Diagnosis Date Noted  . Hallucination, visual 07/25/2017  . Abnormal LFTs 07/24/2017  . Acute on chronic diastolic CHF (congestive heart failure) ([place])   . Elevated LFTs   . AKI (acute kidney injury) ([place])   . Elevated troponin   . Stroke, acute, embolic ([place]) 32/44/0102  . Mitral regurgitation 06/17/2017  . Acute CVA (cerebrovascular accident) ([place])   . Thyroid nodule 06/15/2017  . Atrial fibrillation with RVR ([place]) 06/14/2017  . CVA (cerebral vascular accident) ([place]) 06/14/2017  . Type II diabetes mellitus ([place])   . Peripheral vascular disease ([place])   . Hyperlipidemia   . Hiatal hernia   . Edema   . Chronic diastolic (congestive) heart failure ([place])   . Carpal tunnel syndrome   . Atrial fibrillation ([place])   . Anemia   . Actinic keratosis   . Intractable nausea and vomiting 05/31/2016  . Nausea & vomiting 05/30/2016  . Abnormal chest x-ray 05/30/2016  . Diabetes mellitus with complication ([place])   . Influenza A 05/21/2016  . Chronic atrial fibrillation ([place]) 05/20/2016  . Acute CHF (congestive heart failure) ([place]) 05/20/2016  . History of GI bleed 03/29/2016  . Chronic anticoagulation 03/29/2016  . Symptomatic anemia 03/19/2016  . Spinal stenosis of  lumbar region 02/10/2016  . Varicose veins of left leg with edema 09/15/2015  . Preop cardiovascular exam 08/25/2015  . Iron deficiency anemia due to chronic blood loss 07/17/2015  . Anemia of chronic renal failure, stage 3 (moderate) ([place]) 07/17/2015  . Anticoagulant causing adverse effect in therapeutic use 07/17/2015  . Symptomatic bradycardia 02/21/2015  . Bradycardia   . Acute deep vein thrombosis (DVT) of left lower extremity ([place]) 05/06/2014  . Abdominal wall mass of right lower quadrant 07/08/2013  . OA (osteoarthritis) of hip 04/03/2013  . Osteoarthritis of hip 01/18/2013  . Night sweats 09/06/2012  . Chest pain   . Anxiety and depression   . Gastroesophageal reflux disease   . Hepatitis   . Jaundice   . Hypertension 07/01/2010  . Hypercholesterolemia 07/01/2010  . DM2 (diabetes mellitus, type 2) ([place]) 07/01/2010  . Rheumatoid arthritis ([place]) 07/01/2010  . Sinus tachycardia 07/01/2010  . Chest pain, atypical 07/01/2010    Orientation RESPIRATION BLADDER Height & Weight        O2(nasal cannula 2L) Incontinent, External catheter Weight: 174 lb 2.6 oz (79 kg) Height:  5\' 7"  (170.2 cm)  BEHAVIORAL SYMPTOMS/MOOD NEUROLOGICAL BOWEL NUTRITION STATUS      Continent Diet(dysphagia 3/ please see DC summary)  AMBULATORY STATUS COMMUNICATION OF NEEDS Skin   Extensive Assist Verbally Normal                       Personal Care Assistance Level of Assistance  Bathing, Feeding,  Dressing Bathing Assistance: Maximum assistance(mod assist) Feeding assistance: Limited assistance Dressing Assistance: (mod assist)     Functional Limitations Info  Sight, Hearing, Speech Sight Info: Impaired Hearing Info: Adequate Speech Info: Impaired    SPECIAL CARE FACTORS FREQUENCY  Speech therapy, PT (By licensed PT)     PT Frequency: 5x/week OT Frequency: 5x/week     Speech Therapy Frequency: 2x/week      Contractures Contractures Info: Not present    Additional Factors  Info  Code Status, Allergies, Psychotropic Code Status Info: Partial Allergies Info: No Known Allergies Psychotropic Info: wellbutrin         Current Medications (07/25/2017):  This is the current hospital active medication list Current Facility-Administered Medications  Medication Dose Route Frequency Provider Last Rate Last Dose  . [START ON 07/27/2017] Abatacept SOAJ 125 mg  125 mg Subcutaneous Q Thu Winfrey, Amanda C, MD      . buPROPion [facility] SR) 12 hr tablet 150 mg  150 mg Oral BID Kathrene Alu, MD   150 mg at 07/25/17 1143  . cholecalciferol (VITAMIN D) tablet 1,000 Units  1,000 Units Oral QPM Winfrey, Amanda C, MD      . dextrose 5 %-0.45 % sodium chloride infusion   Intravenous Continuous Everrett Coombe, MD 50 mL/hr at 07/25/17 9390    . diclofenac sodium (VOLTAREN) 1 % transdermal gel 2 g  2 g Topical QHS PRN Kathrene Alu, MD      . diltiazem (CARDIZEM) 100 mg in dextrose 5% 128mL (1 mg/mL) infusion  5-15 mg/hr Intravenous Continuous Kathrene Alu, MD 10 mL/hr at 07/25/17 1128 10 mg/hr at 07/25/17 1128  . ferrous gluconate (FERGON) tablet 324 mg  324 mg Oral BID WC Winfrey, Estill Bamberg C, MD   324 mg at 07/25/17 1143  . heparin ADULT infusion 100 units/mL (25000 units/2110mL sodium chloride 0.45%)  1,100 Units/hr Intravenous Continuous Dickie La, MD 11 mL/hr at 07/25/17 0240 1,100 Units/hr at 07/25/17 0240  . meclizine (ANTIVERT) tablet 12.5 mg  12.5 mg Oral Q4H PRN Kathrene Alu, MD      . metoprolol tartrate (LOPRESSOR) tablet 100 mg  100 mg Oral BID Kathrene Alu, MD      . nitroGLYCERIN (NITROSTAT) SL tablet 0.4 mg  0.4 mg Sublingual Q5 min PRN Kathrene Alu, MD      . ondansetron (ZOFRAN) injection 4 mg  4 mg Intravenous Q6H PRN Kathrene Alu, MD      . pantoprazole (PROTONIX) EC tablet 40 mg  40 mg Oral Daily Kathrene Alu, MD   40 mg at 07/25/17 1143  . predniSONE (DELTASONE) tablet 5 mg  5 mg Oral Daily Kathrene Alu, MD   5 mg at  07/25/17 1143  . senna-docusate (Senokot-S) tablet 1 tablet  1 tablet Oral QHS PRN Winfrey, Alcario Drought, MD      . vitamin B-12 (CYANOCOBALAMIN) tablet 1,000 mcg  1,000 mcg Oral QPM Winfrey, Alcario Drought, MD         Discharge Medications: Please see discharge summary for a list of discharge medications.  Relevant Imaging Results:  Relevant Lab Results:   Additional Information SSN: [id number]  Estanislado Emms, LCSW

## 2017-07-25 NOTE — Evaluation (Signed)
Clinical/Bedside Swallow Evaluation Patient Details  Name: [name] MRN: [id number] Date of Birth: 04-04-1946  Today's Date: 07/25/2017 Time: SLP Start Time (ACUTE ONLY): 68 SLP Stop Time (ACUTE ONLY): 1055 SLP Time Calculation (min) (ACUTE ONLY): 15 min  Past Medical History:  Past Medical History:  Diagnosis Date  . Abnormal LFTs 07/24/2017  . Actinic keratosis   . Anemia    hx of  . Anemia of chronic renal failure, stage 3 (moderate) ([place]) 07/17/2015  . Anticoagulant causing adverse effect in therapeutic use 07/17/2015  . Anxiety   . Arthritis    rheumatoid  . Atrial fibrillation ([place])    a. persistent, she remains on Xarelto  . Carpal tunnel syndrome   . Chronic diastolic (congestive) heart failure ([place])    a. 04/2016: echo showing a preserved EF of 55-60% with mild MR. LA and RA midly dilated.   . Edema    left leg at ankle resolved now  . Gastroesophageal reflux disease   . Hepatitis 1970   not sure what kind  . Hiatal hernia   . Hyperlipidemia   . Hypertension   . Iron deficiency anemia due to chronic blood loss 07/17/2015  . Jaundice    age 58  . Peripheral vascular disease ([place]) yrs ago   DVT left lower leg questionale told by 2 drs i had no clot, 1 md said i did  . Rheumatoid arthritis(714.0)   . Right knee pain   . Stroke ([place])   . Type II diabetes mellitus ([place])    type2   Past Surgical History:  Past Surgical History:  Procedure Laterality Date  . CATARACT EXTRACTION Bilateral   . COLONOSCOPY WITH PROPOFOL N/A 02/20/2015   Procedure: COLONOSCOPY WITH PROPOFOL;  Surgeon: Carol Ada, MD;  Location: [facility] END[facility]COPY;  Service: Endoscopy;  Laterality: N/A;  . ENTEROSCOPY N/A 03/23/2016   Procedure: ENTEROSCOPY;  Surgeon: Carol Ada, MD;  Location: [facility];  Service: Endoscopy;  Laterality: N/A;  . ENTEROSCOPY N/A 06/17/2016   Procedure: ENTEROSCOPY;  Surgeon: Carol Ada, MD;  Location: [facility];  Service: Endoscopy;  Laterality: N/A;  .  ESOPHAGOGASTRODUODENOSCOPY (EGD) WITH PROPOFOL N/A 02/20/2015   Procedure: ESOPHAGOGASTRODUODENOSCOPY (EGD) WITH PROPOFOL;  Surgeon: Carol Ada, MD;  Location: [facility];  Service: Endoscopy;  Laterality: N/A;  . GIVENS CAPSULE STUDY N/A 03/21/2016   Procedure: GIVENS CAPSULE STUDY;  Surgeon: Carol Ada, MD;  Location: [facility];  Service: Endoscopy;  Laterality: N/A;  . HAND SURGERY  1995 and 1996   artificial joints both hands  . JOINT REPLACEMENT    . LUMBAR LAMINECTOMY/DECOMPRESSION MICRODISCECTOMY N/A 02/10/2016   Procedure: MICROLUMBAR DECOMPRESSION L4-L5 AND L3- L4, AND EXCISION OF SYNOVIAL CYST L4-L5;  Surgeon: Susa Day, MD;  Location: [facility];  Service: Orthopedics;  Laterality: N/A;  . TOTAL HIP ARTHROPLASTY Left 2009  . TOTAL HIP ARTHROPLASTY Right 04/03/2013   Procedure: RIGHT TOTAL HIP ARTHROPLASTY ANTERIOR APPROACH;  Surgeon: Gearlean Alf, MD;  Location: [facility];  Service: Orthopedics;  Laterality: Right;   HPI:  Pt is a 72 y.o. female presenting with atrial fibrillation with RVR. Per chart review, pt would cough at SNF when taking multiple pills together. CXR on admission without concern for PNA. PMH is significant for atrial fibrillation, altered mental status, recent embolic stroke (seen by SLP for cognition, not swallowing), CKD3, HFpEF, HTN, HLD, T2DM, GERD, RA, Anxiety/Depression, frequent falls.   Assessment / Plan / Recommendation Clinical Impression  Pt had no overt s/s of aspiration initially  across PO trials, although upon returning to straw sips of water after solid intake she had one immediate cough and a few delayed throat clears. Question if this could be related to h/o GERD, although several additional sips of water were administered by cup without overt signs of difficulty. CXR is not suggestive of acute infection. Recommend starting Dys 3 diet and thin liquids without straws. Would given meds one at a time in puree. SLP will f/u to assess for tolerance  and will consider MBS if further signs of dysphagia are noted. SLP Visit Diagnosis: Dysphagia, unspecified (R13.10)    Aspiration Risk  Mild aspiration risk    Diet Recommendation Dysphagia 3 (Mech soft);Thin liquid   Liquid Administration via: Cup;No straw Medication Administration: Whole meds with puree Supervision: Patient able to self feed;Intermittent supervision to cue for compensatory strategies Compensations: Minimize environmental distractions;Slow rate;Small sips/bites;Follow solids with liquid Postural Changes: Seated upright at 90 degrees;Remain upright for at least 30 minutes after po intake    Other  Recommendations Oral Care Recommendations: Oral care BID   Follow up Recommendations Skilled Nursing facility      Frequency and Duration min 2x/week  2 weeks       Prognosis Prognosis for Safe Diet Advancement: Fair Barriers to Reach Goals: Cognitive deficits      Swallow Study   General HPI: Pt is a 72 y.o. female presenting with atrial fibrillation with RVR. Per chart review, pt would cough at SNF when taking multiple pills together. CXR on admission without concern for PNA. PMH is significant for atrial fibrillation, altered mental status, recent embolic stroke (seen by SLP for cognition, not swallowing), CKD3, HFpEF, HTN, HLD, T2DM, GERD, RA, Anxiety/Depression, frequent falls. Type of Study: Bedside Swallow Evaluation Previous Swallow Assessment: none in chart Diet Prior to this Study: NPO Temperature Spikes Noted: Yes(100.4) Respiratory Status: Nasal cannula History of Recent Intubation: No Behavior/Cognition: Alert;Cooperative;Pleasant mood Oral Cavity Assessment: Within Functional Limits Oral Care Completed by SLP: No Oral Cavity - Dentition: Adequate natural dentition Vision: Functional for self-feeding Self-Feeding Abilities: Able to feed self Patient Positioning: Upright in bed Baseline Vocal Quality: Normal Volitional Cough: Strong Volitional  Swallow: Able to elicit    Oral/Motor/Sensory Function Overall Oral Motor/Sensory Function: Within functional limits   Ice Chips Ice chips: Not tested   Thin Liquid Thin Liquid: Impaired Presentation: Cup;Self Fed;Straw Pharyngeal  Phase Impairments: Cough - Immediate    Nectar Thick Nectar Thick Liquid: Not tested   Honey Thick Honey Thick Liquid: Not tested   Puree Puree: Within functional limits Presentation: Self Fed;Spoon   Solid   GO   Solid: Within functional limits Presentation: Self [place] 07/25/2017,11:11 AM   Germain Osgood, M.A. CCC-SLP [phone]

## 2017-07-25 NOTE — Progress Notes (Signed)
FPTS Interim Progress Note  Patient persistently hypoglycemic despite D50 ampules. CBG 42>>55 with 1 amp D50. Hesitant to start fluids with possible acute CHF exacerbation in setting of afib w/RVR. However, unsafe to have her glucose low overnight.  - gentle IVF 50 cc/hr D51/2NS - continue q4H CBGs - monitor vitals closely with fluids on - turn off fluids in AM if CBGs stabilize  Everrett Coombe, MD 07/25/2017, 2:56 AM PGY-2, [facility] Service pager [phone]

## 2017-07-25 NOTE — Progress Notes (Addendum)
Family Medicine Teaching Service Daily Progress Note Intern Pager: [phone]  Patient name: [name] Medical record number: [id number] Date of birth: 15-Jul-1945 Age: 72 y.o. Gender: female  Primary Care Provider: Aletha Halim., PA-C Consultants: cardiology Code Status: partial (DNI)  Pt Overview and Major Events to Date:  Admitted 4/22  Assessment and Plan:  [name] is a 72 y.o. female presenting with atrial fibrillation with RVR. PMH is significant for atrial fibrillation, altered mental status, recent embolic stroke, CKD3, HFpEF, HTN, HLD, T2DM, GERD, RA, Anxiety/Depression, frequent falls.  Atrial fibrillation with RVR  Atrial fibrillation with rate at 161 on admission, controlled with a Cardizem drip with resulting heart rate in upper 90s to low 100s.  Cardiology following. - continue Cardizem drip, may be able to transition to PO Diltiazem on 4/24 since patient on dysphagia 3 diet - restart PO Lopressor 100 mg BID  Altered Mental Status Likely a mixture of vascular dementia with hypoglycemia, although other factors could be involved.  SNF nurse reported that she has been altered and has been hallucinating since being at their facility.  Family says that she has been confused since her stroke eight weeks ago.  UDS negative.   Ammonia wnl.   Lethargic on 4/23 am.  Hopeful that her AMS will improve as she eats and her blood glucose increases, but this may also be her new baseline. - psychiatry consult - hold gabapentin due to patient's sleepiness - continue Wellbutrin 150 mg BID  Hypoglycemia Patient's blood sugar was consistently low overnight, ranging from 47 to 120 but mostly in the 50-80 range.  Likely due to Amaryl dose on 4/23 combined with poor PO intake and reduced clearance due to AKI.  Was given D50 x 3 and started on D51/2NS.  Since patient can now eat, hopefully blood sugars will rise.   - stop fluids once blood sugars normalize - continue CBG  monitoring  Chest pain Patient has reported chest pain over the past three days with accompanying nausea.  Likely due to heart palpitations.  EKG difficult to interpret since patient was in RVR but did not show signs of ischemia. Troponins are flat. - appreciate cardiology recommendations  Elevated Liver enzymes - uncertain etiology. Patient endorses vomiting x1. May be due to intravascular depletion vs hepatic congestion from CHF.  LFTs are trending down with AST 467>380, ALT 486>428.  - follow daily CMP - stop Arava due to hepatic injury  HFpEF Echo performed on 4/22 significant for EF 50-55%, although could be falsely decreased due to atrial fibrillation.  RV systolic pressure was increased, consistent with mild pulmonary hypertension.  No crackles heard on lung exam.  CXR notable for mild interstitial edema with enlarged cardiac silhouette.  BNP elevated from baseline at 670.9 (baseline appears to be 200-250).  Given Lasix 20 mg IV on 4/22. - strict I's and O's - daily weights - no further diuresis given increase in creatinine to 2.0 on 4/23  AKI Patient with creatinine of 1.62 at admission, baseline appears to be about 0.70-0.80.  This is likely prerenal in the setting of dehydration/intravascular depletion.  Patient has not been eating or drinking well and appears dry on exam. - repeat CMP in am - monitor with diuresis  Abdominal pain Patient reported epigastric pain over the past three days with a loss of appetite.  Had nausea with some vomiting on 4/21.  Has a history of GERD for which she takes Nexium 40 mg daily.   - hepatitis  panel - am CMP  HTN Chronic. Blood pressures have ranged from 98/71 - 168/91 since admission.  Home medications include metoprolol tartrate 100mg  BID and diltiazem 360mg  daily - continue Lopressor 100 mg BID - diltiazem drip  Type II DM Last A1c from March 2019 was 6.9.  Patient takes Amaryl at home. - Hold home Amaryl - Monitor CBG, can add SSI  if needed  HLD Lipid panel from March 2019 with total cholesterol 143, HDL 31, LDL 66, TG 229 - hold home Crestor in setting of elevated LFTs  Rheumatoid arthritis Chronic. Stable.  Patient takes Keith Rake and prednisone at home - will stop home Bol[place]ar since this medication can cause liver injury - prednisone 5 mg daily - Continue Orencia injection q Thursdayif patient still in hospital  Anxiety/Depression Takes Wellbutrin 150 mg BID at home.  Unknown if patient has exhibited post-stroke depression. - continue home Wellbutrin  Chronic anemia Takes Fe supplement at home.  Hgb 12.3, which is slightly above her baseline, likely heme-concentrated due to dehydration. - continue to monitor - continue home Fe  Frequent falls Patient has sustained multiple falls in the past few months, with her last fall about one week ago resulting in a broken R second toe.  Patient uses walker for ambulation.  CT head negative for bleed. - up only with assistance  Hypokalemia Potassium 2.6 on 4/23.  - replete with IV potassium x 5 - recheck BMP at 1400  FEN/GI: dysphagia 3 diet Prophylaxis: heparin drip  Disposition: admit to stepdown  Subjective:  Patient asleep during exam this morning.  Spoke with son at bedside, who noted continued altered mental status overnight.  Objective: Temp:  [97.7 F (36.5 C)-100.4 F (38 C)] 98.5 F (36.9 C) (04/23 0330) Pulse Rate:  [29-128] 82 (04/23 0529) Resp:  [12-34] 12 (04/23 0529) BP: (98-168)/(57-118) 122/86 (04/23 0529) SpO2:  [94 %-100 %] 100 % (04/23 0529) Weight:  [172 lb (78 kg)-177 lb 11.1 oz (80.6 kg)] 174 lb 2.6 oz (79 kg) (04/23 0434) Physical Exam: General: lying in bed asleep Cardiovascular: irregular rhythm, normal rate Respiratory: CTAB Abdomen: nontender, nondistended Extremities: no pedal edema  Laboratory: Recent Labs  Lab 07/24/17 0935 07/24/17 1007 07/25/17 0552  WBC 11.6*  --  9.2  HGB 12.3 13.6 11.9*  HCT  40.2 40.0 39.0  PLT 237  --  186   Recent Labs  Lab 07/24/17 0935 07/24/17 1007 07/24/17 1852 07/25/17 0552  NA 135 139  --  139  K 4.7 4.6  --  2.6*  CL 103 106  --  106  CO2 14*  --   --  21*  BUN 32* 39*  --  25*  CREATININE 1.62* 1.60*  --  2.07*  CALCIUM 8.6*  --   --  8.5*  PROT 6.1*  --  6.3* 5.5*  BILITOT 1.3*  --  1.5* 1.5*  ALKPHOS 132*  --  137* 126  ALT 482*  --  486* 428*  AST 502*  --  467* 380*  GLUCOSE 64* 59*  --  85    Imaging/Diagnostic Tests: Ct Head Wo Contrast  Result Date: 07/24/2017 CLINICAL DATA:  Altered mental status.  Nausea and vomiting. EXAM: CT HEAD WITHOUT CONTRAST TECHNIQUE: Contiguous axial images were obtained from the base of the skull through the vertex without intravenous contrast. COMPARISON:  Brain MRI and head CT 06/14/2017 FINDINGS: Brain: Known recent infarcts in the right occipital lobe and temporoparietal region. No visible hemorrhagic conversion. Small  remote right inferior cerebellar infarct. No evidence of acute infarct, hemorrhage, hydrocephalus, or mass. Vascular: Atherosclerotic calcification.  No hyperdense vessel. Skull: No acute or aggressive finding Sinuses/Orbits: Bilateral cataract resection. IMPRESSION: 1. No acute finding. 2. Known recent right cerebral infarcts. Electronically Signed   By: Monte Fantasia M.D.   On: 07/24/2017 11:54   Dg Chest Port 1 View  Result Date: 07/24/2017 CLINICAL DATA:  Onset of shortness of breath and left-sided chest pain this morning. History of atrial fibrillation, diabetes, and hypertension. Nonsmoker. EXAM: PORTABLE CHEST 1 VIEW COMPARISON:  PA and lateral chest x-ray of July 11, 2016 FINDINGS: The lungs are adequately inflated. The interstitial markings are mildly increased. The cardiac silhouette is enlarged. The pulmonary vascularity is mildly prominent. There is no definite pleural effusion. The mediastinum is normal in width. The observed bony thorax exhibits no acute abnormality. There is  an old fracture of the lateral aspect of the left third rib. IMPRESSION: Findings suggestive of low-grade CHF with mild interstitial edema. No alveolar pneumonia nor pneumothorax. Electronically Signed   By: David  Martinique M.D.   On: 07/24/2017 10:02     Kathrene Alu, MD 07/25/2017, 7:33 AM PGY-1, [place] Intern pager: [phone], text pages welcome

## 2017-07-25 NOTE — Progress Notes (Addendum)
1915 Bedside shift report. Pt resting in bed, son at bedside. No complaints, lines, gtts, checked and verified. Fall precautions in place, [facility].   2000 Pt assessed, see flow sheet. Dilt and heparin gtts infusing. Peri care performed, pure wick with leak, pad changed and skin checked. RN paged MD about pt's c/o dry nose, switching IV K to oral, and pt's high blood sugars. MD stated he would put in orders. Son updated with POC. Fall precautions in place, [facility].   2200 Pt medicated per MAR. No complaints. [facility]CT[facility].   2345 Pt sleeping, NAD, VSS.

## 2017-07-25 NOTE — Progress Notes (Addendum)
Progress Note  Patient Name: [name] Date of Encounter: 07/25/2017  Primary Cardiologist: Kirk Ruths, MD   Subjective   Very lethargic this morning and hard to keep awake.  She is continued to have hypoglycemic episodes with blood sugars of 42 and 55 overnight.  She was started on 50 cc/h of D5 half-normal saline.  Blood sugar this morning 53 despite ongoing glucose administration through IV fluids.  She spiked a temp to 100.4 last night.  Inpatient Medications    Scheduled Meds: . [START ON 07/27/2017] Abatacept  125 mg Subcutaneous Q Thu  . buPROPion  150 mg Oral BID  . cholecalciferol  1,000 Units Oral QPM  . ferrous gluconate  324 mg Oral BID WC  . leflunomide  20 mg Oral Daily  . metoprolol tartrate  2.5 mg Intravenous Q6H  . pantoprazole  40 mg Oral Daily  . predniSONE  5 mg Oral Daily  . cyanocobalamin  1,000 mcg Oral QPM   Continuous Infusions: . dextrose 5 % and 0.45% NaCl 50 mL/hr at 07/25/17 0312  . diltiazem (CARDIZEM) infusion 10 mg/hr (07/25/17 0437)  . heparin 1,100 Units/hr (07/25/17 0240)  . potassium chloride 10 mEq (07/25/17 0807)   PRN Meds: diclofenac sodium, meclizine, nitroGLYCERIN, ondansetron (ZOFRAN) IV, senna-docusate   Vital Signs    Vitals:   07/25/17 0434 07/25/17 0507 07/25/17 0529 07/25/17 0740  BP:  116/82 122/86 94/69  Pulse:  60 82 93  Resp:  19 12 19   Temp:    98.4 F (36.9 C)  TempSrc:    Oral  SpO2:  99% 100% 94%  Weight: 174 lb 2.6 oz (79 kg)     Height:        Intake/Output Summary (Last 24 hours) at 07/25/2017 0844 Last data filed at 07/25/2017 0600 Gross per 24 hour  Intake 819.75 ml  Output 1300 ml  Net -480.25 ml   Filed Weights   07/24/17 1447 07/24/17 1725 07/25/17 0434  Weight: 172 lb (78 kg) 177 lb 11.1 oz (80.6 kg) 174 lb 2.6 oz (79 kg)    Telemetry    Atrial fibrillation - Personally Reviewed  ECG    No new EKG to review- Personally Reviewed  Physical Exam   GEN: No acute distress.  Very  lethargic Neck: No JVD Cardiac:  Irregularly irregular, no murmurs, rubs, or gallops.  Respiratory: Clear to auscultation bilaterally. GI: Soft, nontender, non-distended  MS: No edema; No deformity. Neuro:   Lethargic Psych: Says that she is fairly lethargic  Labs    Chemistry Recent Labs  Lab 07/24/17 0935 07/24/17 1007 07/24/17 1852 07/25/17 0552  NA 135 139  --  139  K 4.7 4.6  --  2.6*  CL 103 106  --  106  CO2 14*  --   --  21*  GLUCOSE 64* 59*  --  85  BUN 32* 39*  --  25*  CREATININE 1.62* 1.60*  --  2.07*  CALCIUM 8.6*  --   --  8.5*  PROT 6.1*  --  6.3* 5.5*  ALBUMIN 3.1*  --  3.2* 2.8*  AST 502*  --  467* 380*  ALT 482*  --  486* 428*  ALKPHOS 132*  --  137* 126  BILITOT 1.3*  --  1.5* 1.5*  GFRNONAA 31*  --   --  23*  GFRAA 36*  --   --  26*  ANIONGAP 18*  --   --  12  Hematology Recent Labs  Lab 07/24/17 0935 07/24/17 1007 07/25/17 0552  WBC 11.6*  --  9.2  RBC 4.94  --  4.78  HGB 12.3 13.6 11.9*  HCT 40.2 40.0 39.0  MCV 81.4  --  81.6  MCH 24.9*  --  24.9*  MCHC 30.6  --  30.5  RDW 20.8*  --  20.8*  PLT 237  --  186    Cardiac Enzymes Recent Labs  Lab 07/24/17 1436 07/24/17 1852 07/25/17 0024 07/25/17 0552  TROPONINI 0.07* 0.09* 0.09* 0.06*    Recent Labs  Lab 07/24/17 1006  TROPIPOC 0.00     BNP Recent Labs  Lab 07/24/17 1007  BNP 670.9*     DDimer No results for input(s): DDIMER in the last 168 hours.   Radiology    Ct Head Wo Contrast  Result Date: 07/24/2017 CLINICAL DATA:  Altered mental status.  Nausea and vomiting. EXAM: CT HEAD WITHOUT CONTRAST TECHNIQUE: Contiguous axial images were obtained from the base of the skull through the vertex without intravenous contrast. COMPARISON:  Brain MRI and head CT 06/14/2017 FINDINGS: Brain: Known recent infarcts in the right occipital lobe and temporoparietal region. No visible hemorrhagic conversion. Small remote right inferior cerebellar infarct. No evidence of acute  infarct, hemorrhage, hydrocephalus, or mass. Vascular: Atherosclerotic calcification.  No hyperdense vessel. Skull: No acute or aggressive finding Sinuses/Orbits: Bilateral cataract resection. IMPRESSION: 1. No acute finding. 2. Known recent right cerebral infarcts. Electronically Signed   By: Monte Fantasia M.D.   On: 07/24/2017 11:54   Dg Chest Port 1 View  Result Date: 07/24/2017 CLINICAL DATA:  Onset of shortness of breath and left-sided chest pain this morning. History of atrial fibrillation, diabetes, and hypertension. Nonsmoker. EXAM: PORTABLE CHEST 1 VIEW COMPARISON:  PA and lateral chest x-ray of July 11, 2016 FINDINGS: The lungs are adequately inflated. The interstitial markings are mildly increased. The cardiac silhouette is enlarged. The pulmonary vascularity is mildly prominent. There is no definite pleural effusion. The mediastinum is normal in width. The observed bony thorax exhibits no acute abnormality. There is an old fracture of the lateral aspect of the left third rib. IMPRESSION: Findings suggestive of low-grade CHF with mild interstitial edema. No alveolar pneumonia nor pneumothorax. Electronically Signed   By: David  Martinique M.D.   On: 07/24/2017 10:02    Cardiac Studies   2D echo 07/24/2017 Study Conclusions  - Left ventricle: The cavity size was normal. Systolic function was   normal. The estimated ejection fraction was in the range of 50%   to 55%. EF may be underestimated due to underlying atrial   fibrillation with intermittent RVR Wall motion was normal; there   were no regional wall motion abnormalities. The study was not   technically sufficient to allow evaluation of LV diastolic   dysfunction due to atrial fibrillation. - Aortic valve: Severe diffuse calcification involving the   noncoronary cusp. - Mitral valve: Calcified annulus. There was mild regurgitation. - Pulmonary arteries: PA peak pressure: 36 mm Hg (S).  Impressions:  - The right ventricular  systolic pressure was increased consistent   with mild pulmonary hypertension.  Patient Profile     72 y.o. female who is been followed by Dr. Stanford Breed since January of this year.  She has a history of chronic atrial fibrillation, normal LV function by echo 06/16/2017 with EF 60-65%, moderate mitral stenosis, mild to moderate mitral regurgitation, moderate pulmonary hypertension with PA systolic pressure 49 mmHg.  She also has a  history of recent embolic CVA, chronic kidney disease stage III, chronic diastolic heart failure, hypertension, hyperlipidemia, type 2 diabetes mellitus, rheumatoid arthritis, falls, chronic iron deficiency anemia and GERD who presented to the emergency room after developing confusion over the past few days at [facility] she was for rehab from her CVA.  Her son is in the room with her currently and states that she has complained of chest pain intermittently for the past 24-48 hours that she states is over her left breast and is worse with palpation.  He was found to be in atrial fibrillation with RVR.  Assessment & Plan    1.  Atrial fibrillation with RVR ([place]) -She has a history of chronic atrial fibrillation and suspect the elevated heart rate is due to acute underlying illness -Heart rate much improved this morning in the 80-90 bpm range -Continue on IV Cardizem drip and IV Lopressor 2.5mg  q6 hours.  She is currently not taking p.o. due to lethargy and encephalopathy -She was changed from NOAC to IV heparin for anticoagulation until she can take p.o.  2. Chronic diastolic CHF - Pt weight is the same as it was when she was last admitted but son states po intake has been poor. -She does not appear volume overloaded on exam despite chest x-ray showing low-grade CHF and mildly elevated BNP - she got Lasix 20 mg IV in the ER creatinine bumped to 2 this morning -2D echocardiogram showed low normal LV function with EF 50-55% which may be underestimated given her  underlying atrial fibrillation with intermittent RVR during the study  3.  Encephalopathy -unclear etiology -Spiked a temp to 100 overnight and feels on exam like she has a fever at this time -Suspect she has an acute viral syndrome given her recent decreased p.o. intake, nausea and diarrhea -She also has had persistently low blood sugars despite IV dextrose which may be contributing to her encephalopathy -UA shows cloudy urine but no bacteria and 6-30 white blood cells -Check ammonia level given her elevated LFTs -Consider neuro evaluation  4.  Elevated LFTs - ?  Etiology -LFTs remain high but slightly improved from yesterday -Hepatitis panel pending -Possible viral etiology given low-grade fevers as well -Could also be due to shock liver from transient hypotension that may not have been noted at [facility] where she was in rehab -Continue to follow trend -Further workup by IM -Consider GI consult  5.  Persistent hypoglycemia unknown etiology -Could be second to liver failure given her marked elevation in LFTs -She was on Glimepiride as an outpatient but has not received that since admission -Continue with D5 half-normal saline follow blood sugar closely  6.  Chronic steroid use -Need to consider stress dose steroids in the interim given her acute illness  7.  Elevated troponin with flat trend (0.07>0.09>0.09>0.06) -Likely related to demand ischemia in the setting of atrial fibrillation with RVR, acute kidney injury and acute illness -2D echo shows low normal LV function -No acute ST changes on EKG  8.  Hypokalemia - replete per IM  9.  Acute on CKD stage 3 -Creatinine now up to 2.07 likely related to diuretic use on admission -Diuretics are now on hold -Getting gentle hydration -Continue to follow renal function  I have spent a total of 45 minutes with patient reviewing hospital notes, 2D echo , telemetry, EKGs, labs and examining patient as well as establishing an  assessment and plan that was discussed with the patient.  > 50% of  time was spent in direct patient care.     For questions or updates, please contact [place] Please consult [email or web address] for contact info under Cardiology/STEMI.      Signed, Fransico Him, MD  07/25/2017, 8:44 AM

## 2017-07-25 NOTE — Evaluation (Signed)
Physical Therapy Evaluation Patient Details Name: [name] MRN: [id number] DOB: 18-Nov-1945 Today's Date: 07/25/2017   History of Present Illness  Pt adm with afib with rvr and AMS. Per SNF pt with hallucinations at times. PMH - recent rt PCA stroke, afib, chronic back pain, ckd, dm, frequent falls, RA, anxiety/depression,   Clinical Impression  Pt admitted with above diagnosis and presents to PT with functional limitations due to deficits listed below (See PT problem list). Pt needs skilled PT to maximize independence and safety to allow discharge back to SNF to continue rehab.      Follow Up Recommendations SNF    Equipment Recommendations  None recommended by PT    Recommendations for Other Services       Precautions / Restrictions Precautions Precautions: Fall Restrictions Weight Bearing Restrictions: No      Mobility  Bed Mobility Overal bed mobility: Needs Assistance Bed Mobility: Rolling;Sidelying to Sit Rolling: Supervision Sidelying to sit: Min assist;HOB elevated       General bed mobility comments: Incr time to roll and verbal cues for technique. Min assist to finish elevation of trunk into sitting  Transfers Overall transfer level: Needs assistance Equipment used: 4-wheeled walker Transfers: Sit to/from [facility] Sit to Stand: +2 physical assistance;Mod assist Stand pivot transfers: +2 physical assistance;Mod assist       General transfer comment: Assist to bring hips up and for balance for sit to stand. Pt placing both hands on walker handles to rise despite verbal/tactile cues. On initial stand from bed pt required +2 mod assist. From recliner pt able to stand with +1 mod assist and +1 for lines/safety. +2 mod assist for stand pivot bed to chair with walker. Pt able to advance or pivot lt foot during transfer. Pt unable to fully turn to chair prior to sitting on corner of chair.   Ambulation/Gait             General Gait  Details: Attempted amb from recliner. Stood with walker pt able to take 1 small step with RLE but unable to advance LLE at all. Pt not weight shifting to rt in order to unweight LLE. Pt also with very wide stance in standing.  Stairs            Wheelchair Mobility    Modified Rankin (Stroke Patients Only)       Balance Overall balance assessment: Needs assistance Sitting-balance support: No upper extremity supported;Feet supported Sitting balance-Leahy Scale: Good     Standing balance support: Bilateral upper extremity supported Standing balance-Leahy Scale: Poor Standing balance comment: walker and min assist for static standing                             Pertinent Vitals/Pain Pain Assessment: No/denies pain    Home Living Family/patient expects to be discharged to:: Skilled nursing facility                 Additional Comments: Has been at SNF since DC from [facility] after CVA in March 2019    Prior Function Level of Independence: Needs assistance   Gait / Transfers Assistance Needed: Assist from therapist at SNF to amb short distances with rolling walker     Comments: Prior to stroke in March was independent with rollator at home     Hand Dominance   Dominant Hand: Right    Extremity/Trunk Assessment   Upper Extremity Assessment Upper Extremity Assessment: Defer  to OT evaluation    Lower Extremity Assessment Lower Extremity Assessment: Generalized weakness;LLE deficits/detail LLE Deficits / Details: Difficulty with functional movement during attempted gait       Communication   Communication: No difficulties  Cognition Arousal/Alertness: Awake/alert Behavior During Therapy: WFL for tasks assessed/performed Overall Cognitive Status: Impaired/Different from baseline Area of Impairment: Problem solving;Awareness;Safety/judgement;Following commands;Memory;Attention                     Memory: Decreased short-term  memory Following Commands: Follows one step commands with increased time Safety/Judgement: Decreased awareness of deficits Awareness: Emergent Problem Solving: Slow processing;Requires verbal cues;Difficulty sequencing        General Comments General comments (skin integrity, edema, etc.): HR to 130's-140's with activity. Back to 100's with rest    Exercises     Assessment/Plan    PT Assessment Patient needs continued PT services  PT Problem List Decreased strength;Decreased activity tolerance;Decreased balance;Decreased mobility;Decreased knowledge of use of DME;Decreased cognition;Decreased safety awareness;Cardiopulmonary status limiting activity       PT Treatment Interventions DME instruction;Gait training;Functional mobility training;Therapeutic activities;Therapeutic exercise;Balance training;Patient/family education;Cognitive remediation;Neuromuscular re-education    PT Goals (Current goals can be found in the Care Plan section)  Acute Rehab PT Goals Patient Stated Goal: Get better PT Goal Formulation: With patient Time For Goal Achievement: 08/08/17 Potential to Achieve Goals: Good    Frequency Min 2X/week   Barriers to discharge        Co-evaluation PT/OT/SLP Co-Evaluation/Treatment: Yes Reason for Co-Treatment: For patient/therapist safety;Complexity of the patient's impairments (multi-system involvement) PT goals addressed during session: Mobility/safety with mobility;Balance         AM-PAC PT "6 Clicks" Daily Activity  Outcome Measure Difficulty turning over in bed (including adjusting bedclothes, sheets and blankets)?: A Little Difficulty moving from lying on back to sitting on the side of the bed? : Unable Difficulty sitting down on and standing up from a chair with arms (e.g., wheelchair, bedside commode, etc,.)?: Unable Help needed moving to and from a bed to chair (including a wheelchair)?: A Lot Help needed walking in hospital room?: Total Help  needed climbing 3-5 steps with a railing? : Total 6 Click Score: 9    End of Session Equipment Utilized During Treatment: Gait belt Activity Tolerance: Patient tolerated treatment well Patient left: in chair;with call bell/phone within reach;with chair alarm set Nurse Communication: Mobility status;Need for lift equipment(recommend Stedy) PT Visit Diagnosis: Other abnormalities of gait and mobility (R26.89);Unsteadiness on feet (R26.81);History of falling (Z91.81);Difficulty in walking, not elsewhere classified (R26.2);Hemiplegia and hemiparesis Hemiplegia - Right/Left: Left Hemiplegia - dominant/non-dominant: Non-dominant Hemiplegia - caused by: Cerebral infarction    Time: 7654-6503 PT Time Calculation (min) (ACUTE ONLY): 34 min   Charges:   PT Evaluation $PT Eval Moderate Complexity: 1 Mod     PT G CodesMarland Kitchen        [facility] PT [place] 07/25/2017, 1:46 PM

## 2017-07-25 NOTE — Evaluation (Signed)
Occupational Therapy Evaluation Patient Details Name: [name] MRN: [id number] DOB: [birth year] Today's Date: 07/25/2017    History of Present Illness Pt adm with A-fib with RVR and AMS. Per SNF pt with hallucinations at times. PMH - recent R PCA stroke, A-fib, chronic back pain, ckd, dm, frequent falls, RA, anxiety/depression,    Clinical Impression   PTA, pt has been participating in rehabilitation at SNF level after being discharged from [employer] level rehabilitation. Pt currently requires mod assist for LB ADL, min assist for UB ADL, and mod assist +2 for simulated stand-pivot toilet transfers. She also requires max assist for toileting hygiene. Pt presenting with poor awareness, decreased problem solving skills, and decreased attention this session. Noted fluctuating awareness of deficits throughout tasks. Pt would benefit from continued OT services while admitted to improve independence with ADL and functional mobility. Feel pt will need to return to SNF for rehabilitation once medically ready.    Follow Up Recommendations  SNF;Supervision/Assistance - 24 hour    Equipment Recommendations  Other (comment)(defer to next venue of care)    Recommendations for Other Services       Precautions / Restrictions Precautions Precautions: Fall Restrictions Weight Bearing Restrictions: No      Mobility Bed Mobility Overal bed mobility: Needs Assistance Bed Mobility: Rolling;Sidelying to Sit Rolling: Supervision Sidelying to sit: Min assist;HOB elevated       General bed mobility comments: Incr time to roll and verbal cues for technique. Min assist to finish elevation of trunk into sitting  Transfers Overall transfer level: Needs assistance Equipment used: 4-wheeled walker Transfers: Sit to/from [facility] Sit to Stand: +2 physical assistance;Mod assist Stand pivot transfers: +2 physical assistance;Mod assist       General transfer comment: Assist to bring  hips up and for balance for sit to stand. Pt placing both hands on walker handles to rise despite verbal/tactile cues. On initial stand from bed pt required +2 mod assist. From recliner pt able to stand with +1 mod assist and +1 for lines/safety. +2 mod assist for stand pivot bed to chair with walker. Pt able to advance or pivot lt foot during transfer. Pt unable to fully turn to chair prior to sitting on corner of chair.     Balance Overall balance assessment: Needs assistance Sitting-balance support: No upper extremity supported;Feet supported Sitting balance-Leahy Scale: Good     Standing balance support: Bilateral upper extremity supported Standing balance-Leahy Scale: Poor Standing balance comment: walker and min assist for static standing                           ADL either performed or assessed with clinical judgement   ADL Overall ADL's : Needs assistance/impaired Eating/Feeding: Set up;Sitting   Grooming: Sitting;Supervision/safety   Upper Body Bathing: Minimal assistance;Sitting   Lower Body Bathing: Sit to/from stand;Moderate assistance Lower Body Bathing Details (indicate cue type and reason): Assist for sit<>stand Upper Body Dressing : Minimal assistance;Sitting   Lower Body Dressing: Moderate assistance;Sit to/from stand Lower Body Dressing Details (indicate cue type and reason): Assist for sit<>stand Toilet Transfer: Moderate assistance;+2 for physical assistance;BSC;RW   Toileting- Clothing Manipulation and Hygiene: Maximal assistance;Sit to/from stand       Functional mobility during ADLs: Moderate assistance;+2 for physical assistance(stand-pivot only) General ADL Comments: Pt with significant limitation in her ability to participate in ADL due to difficulty progressing LLE during transfers and cognitive deficits.      Vision Baseline  Vision/History: Wears glasses Wears Glasses: At all times Patient Visual Report: (aware of her previous deficits  but states these improved) Vision Assessment?: Vision impaired- to be further tested in functional context Additional Comments: Pt able to track therapist's movement and read menu during functional tasks in proper lighting. Will continue to assess.      Perception     Praxis      Pertinent Vitals/Pain Pain Assessment: No/denies pain     Hand Dominance Right   Extremity/Trunk Assessment Upper Extremity Assessment Upper Extremity Assessment: Generalized weakness;RUE deficits/detail;LUE deficits/detail RUE Deficits / Details: Arthritic changes at baseline. Pain at distal ulnar styloid.  LUE Deficits / Details: Arthritic changes at baseline.    Lower Extremity Assessment Lower Extremity Assessment: Generalized weakness(Difficult to progress LLE) LLE Deficits / Details: Difficulty with functional movement during attempted gait       Communication Communication Communication: No difficulties   Cognition Arousal/Alertness: Awake/alert Behavior During Therapy: WFL for tasks assessed/performed Overall Cognitive Status: Impaired/Different from baseline Area of Impairment: Problem solving;Awareness;Safety/judgement;Following commands;Memory;Attention                   Current Attention Level: Selective(fluctuates from sustained to selective) Memory: Decreased short-term memory Following Commands: Follows one step commands with increased time Safety/Judgement: Decreased awareness of deficits Awareness: Emergent Problem Solving: Slow processing;Requires verbal cues;Difficulty sequencing General Comments: Pt initially alert and oriented to her location and the time. However, pt reporting that she has had trouble rememebering why she is here. Pt with fluctuating ability to problem solve and with poor awareness that she had urinated on the floor. Pt with inconsistent understanding of her current situation.    General Comments  HR up to 140 during activity but improved to 100-118  with rest.     Exercises     Shoulder Instructions      Home Living Family/patient expects to be discharged to:: Skilled nursing facility                                 Additional Comments: Has been at SNF since DC from [facility] after CVA in March 2019      Prior Functioning/Environment Level of Independence: Needs assistance  Gait / Transfers Assistance Needed: Assist from therapist at SNF to amb short distances with rolling walker ADL's / Homemaking Assistance Needed: Assist from staff for ADL. Able to feed herself.    Comments: Prior to stroke in March was independent with rollator at home        OT Problem List: Decreased strength;Decreased activity tolerance;Impaired balance (sitting and/or standing);Decreased safety awareness;Decreased knowledge of use of DME or AE;Decreased cognition;Decreased knowledge of precautions;Pain      OT Treatment/Interventions: Self-care/ADL training;Therapeutic exercise;DME and/or AE instruction;Therapeutic activities;Patient/family education;Balance training;Cognitive remediation/compensation    OT Goals(Current goals can be found in the care plan section) Acute Rehab OT Goals Patient Stated Goal: Get better OT Goal Formulation: With patient Time For Goal Achievement: 08/08/17 Potential to Achieve Goals: Good ADL Goals Pt Will Perform Grooming: with modified independence;sitting Pt Will Perform Lower Body Dressing: sit to/from stand;with min guard assist Pt Will Transfer to Toilet: with min assist;stand pivot transfer;bedside commode Pt Will Perform Toileting - Clothing Manipulation and hygiene: with min assist;sit to/from stand Additional ADL Goal #1: Pt will follow multi-step commands during grooming tasks with no more than 1 VC in a minimally distracting environment.  OT Frequency: Min 1X/week   Barriers to D/C:  Co-evaluation PT/OT/SLP Co-Evaluation/Treatment: Yes Reason for Co-Treatment: For  patient/therapist safety;Complexity of the patient's impairments (multi-system involvement) PT goals addressed during session: Mobility/safety with mobility;Balance OT goals addressed during session: ADL's and self-care;Strengthening/ROM      AM-PAC PT "6 Clicks" Daily Activity     Outcome Measure Help from another person eating meals?: None Help from another person taking care of personal grooming?: A Little Help from another person toileting, which includes using toliet, bedpan, or urinal?: A Lot Help from another person bathing (including washing, rinsing, drying)?: A Lot Help from another person to put on and taking off regular upper body clothing?: A Little Help from another person to put on and taking off regular lower body clothing?: A Lot 6 Click Score: 16   End of Session Equipment Utilized During Treatment: Gait belt;Rolling walker Nurse Communication: Mobility status;Other (comment)(use Stedy for back to bed)  Activity Tolerance: Patient tolerated treatment well Patient left: in chair;with call bell/phone within reach;with chair alarm set  OT Visit Diagnosis: Other abnormalities of gait and mobility (R26.89);Muscle weakness (generalized) (M62.81);Other symptoms and signs involving cognitive function;Pain Pain - Right/Left: Right Pain - part of body: Hand(wrist)                Time: 3244-0102 OT Time Calculation (min): 33 min Charges:  OT General Charges $OT Visit: 1 Visit OT Evaluation $OT Eval Moderate Complexity: 1 Mod G-Codes:     Norman Herrlich, MS OTR/L  Pager: [place] A Dusty Raczkowski 07/25/2017, 3:06 PM

## 2017-07-25 NOTE — Plan of Care (Signed)
  Problem: Clinical Measurements: Goal: Ability to maintain clinical measurements within normal limits will improve Outcome: Progressing Goal: Will remain free from infection Outcome: Progressing Goal: Diagnostic test results will improve Outcome: Progressing Goal: Respiratory complications will improve Outcome: Progressing Goal: Cardiovascular complication will be avoided Outcome: Progressing   Problem: Elimination: Goal: Will not experience complications related to urinary retention Outcome: Progressing   Problem: Safety: Goal: Ability to remain free from injury will improve Outcome: Progressing   Problem: Cardiac: Goal: Ability to achieve and maintain adequate cardiopulmonary perfusion will improve Outcome: Progressing

## 2017-07-25 NOTE — Progress Notes (Signed)
Physical Therapy Treatment Patient Details Name: [name] MRN: [id number] DOB: [birth year] Today's Date: 07/25/2017    History of Present Illness Pt adm with afib with rvr and AMS. Per SNF pt with hallucinations at times. PMH - recent rt PCA stroke, afib, chronic back pain, ckd, dm, frequent falls, RA, anxiety/depression,     PT Comments    Pt up in recliner 2.5 hours. Pt sliding down in chair and requiring repositioning. Used Denna Haggard for pivot back to bed.    Follow Up Recommendations  SNF     Equipment Recommendations  None recommended by PT    Recommendations for Other Services       Precautions / Restrictions Precautions Precautions: Fall Restrictions Weight Bearing Restrictions: No    Mobility  Bed Mobility Overal bed mobility: Needs Assistance Bed Mobility: Sit to Sidelying Sit to sidelying: Mod assist General bed mobility comments: Assist to lower trunk and bring legs up into bed  Transfers Overall transfer level: Needs assistance Equipment used: Ambulation equipment used Transfers: Sit to/from [employer] Transfers Sit to Stand: +2 physical assistance;Mod assist Stand pivot transfers: +2 physical assistance(Used Stedy for pivot to bed)       General transfer comment: Assist to bring hips up and for balance.  Ambulation/Gait               Stairs             Wheelchair Mobility    Modified Rankin (Stroke Patients Only)       Balance Overall balance assessment: Needs assistance Sitting-balance support: No upper extremity supported;Feet supported Sitting balance-Leahy Scale: Good     Standing balance support: Bilateral upper extremity supported Standing balance-Leahy Scale: Poor Standing balance comment: Stedy and min assist for static standing                            Cognition Arousal/Alertness: Awake/alert Behavior During Therapy: WFL for tasks assessed/performed Overall Cognitive Status:  Impaired/Different from baseline Area of Impairment: Problem solving;Awareness;Safety/judgement;Following commands;Memory;Attention                   Current Attention Level: Selective(fluctuates from sustained to selective) Memory: Decreased short-term memory Following Commands: Follows one step commands with increased time Safety/Judgement: Decreased awareness of deficits Awareness: Emergent Problem Solving: Slow processing;Requires verbal cues;Difficulty sequencing      Exercises      General Comments General comments (skin integrity, edema, etc.): HR up to 140 during activity but improved to 100-118 with rest.       Pertinent Vitals/Pain Pain Assessment: No/denies pain    Home Living Family/patient expects to be discharged to:: Skilled nursing facility               Additional Comments: Has been at SNF since DC from [facility] after CVA in March 2019    Prior Function Level of Independence: Needs assistance  Gait / Transfers Assistance Needed: Assist from therapist at SNF to amb short distances with rolling walker ADL's / Homemaking Assistance Needed: Assist from staff for ADL. Able to feed herself.  Comments: Prior to stroke in March was independent with rollator at home   PT Goals (current goals can now be found in the care plan section) Acute Rehab PT Goals Patient Stated Goal: Get better PT Goal Formulation: With patient Time For Goal Achievement: 08/08/17 Potential to Achieve Goals: Good Progress towards PT goals: Progressing toward goals    Frequency  Min 2X/week      PT Plan      Co-evaluation PT/OT/SLP Co-Evaluation/Treatment: Yes Reason for Co-Treatment: For patient/therapist safety;Complexity of the patient's impairments (multi-system involvement) PT goals addressed during session: Mobility/safety with mobility;Balance OT goals addressed during session: ADL's and self-care;Strengthening/ROM      AM-PAC PT "6 Clicks" Daily Activity   Outcome Measure  Difficulty turning over in bed (including adjusting bedclothes, sheets and blankets)?: A Little Difficulty moving from lying on back to sitting on the side of the bed? : Unable Difficulty sitting down on and standing up from a chair with arms (e.g., wheelchair, bedside commode, etc,.)?: Unable Help needed moving to and from a bed to chair (including a wheelchair)?: A Lot Help needed walking in hospital room?: Total Help needed climbing 3-5 steps with a railing? : Total 6 Click Score: 9    End of Session Equipment Utilized During Treatment: Gait belt Activity Tolerance: Patient tolerated treatment well Patient left: with call bell/phone within reach;in bed;with bed alarm set Nurse Communication: Mobility status;Need for lift equipment(Nurse present) PT Visit Diagnosis: Other abnormalities of gait and mobility (R26.89);Unsteadiness on feet (R26.81);History of falling (Z91.81);Difficulty in walking, not elsewhere classified (R26.2);Hemiplegia and hemiparesis Hemiplegia - Right/Left: Left Hemiplegia - dominant/non-dominant: Non-dominant Hemiplegia - caused by: Cerebral infarction     Time: 1454-1510 PT Time Calculation (min) (ACUTE ONLY): 16 min  Charges:  $Therapeutic Activity: 8-22 mins                    G Codes:       [facility] PT [place] 07/25/2017, 4:05 PM

## 2017-07-25 NOTE — Progress Notes (Signed)
Inpatient Diabetes Program Recommendations  AACE/ADA: New Consensus Statement on Inpatient Glycemic Control (2015)  Target Ranges:  Prepandial:   less than 140 mg/dL      Peak postprandial:   less than 180 mg/dL (1-2 hours)      Critically ill patients:  140 - 180 mg/dL   Lab Results  Component Value Date   GLUCAP 82 07/25/2017   HGBA1C 6.9 (H) 06/15/2017    Review of Glycemic ControlResults for KOREY, ARROYO (MRN [id number]) as of 07/25/2017 14:46  Ref. Range 07/25/2017 03:52 07/25/2017 05:49 07/25/2017 07:37 07/25/2017 08:46 07/25/2017 11:26  Glucose-Capillary Latest Ref Range: 65 - 99 mg/dL 47 (L) 83 53 (L) 120 (H) 82    Diabetes history: Type 2 DM Outpatient Diabetes medications: Amaryl 2 mg daily Current orders for Inpatient glycemic control:  None Inpatient Diabetes Program Recommendations:   Note low blood sugars since admission, likely due to sulfonylurea?  Consider d/c of Amaryl at discharge due to hypoglycemia.    Thanks,  Adah Perl, RN, BC-ADM Inpatient Diabetes Coordinator Pager [phone] (8a-5p)

## 2017-07-25 NOTE — Progress Notes (Signed)
Spoke with nurse at patient's SNF on 4/22 regarding patient's mental status during the past few weeks.  Her nurse reports that she has had significant confusion and visual and auditory hallucinations over the past few weeks that have not gotten worse immediately prior to admission.  Examples of hallucinations include talking with the phone upside down, continuing to scoop out of an empty food container.  Patient's subjective complaints about her heart have been accurate, however, since she was in RVR when she complained of palpitations.

## 2017-07-25 NOTE — Consult Note (Addendum)
[facility] Consult   Reason for Consult:  Auditory and visual hallucinations s/p stroke Referring Physician:  Dr. Nori Riis  Patient Identification: [name] MRN:  [id number] Principal Diagnosis: Hallucinations Diagnosis:   Patient Active Problem List   Diagnosis Date Noted  . Hallucination, visual [R44.1] 07/25/2017  . Abnormal LFTs [R94.5] 07/24/2017  . Acute on chronic diastolic CHF (congestive heart failure) ([place]) [I50.33]   . Elevated LFTs [R94.5]   . AKI (acute kidney injury) ([place]) [N17.9]   . Elevated troponin [R74.8]   . Stroke, acute, embolic ([place]) [T46.5] 68/03/7516  . Mitral regurgitation [I34.0] 06/17/2017  . Acute CVA (cerebrovascular accident) ([place]) [I63.9]   . Thyroid nodule [E04.1] 06/15/2017  . Atrial fibrillation with RVR ([place]) [I48.91] 06/14/2017  . CVA (cerebral vascular accident) ([place]) [I63.9] 06/14/2017  . Type II diabetes mellitus ([place]) [E11.9]   . Peripheral vascular disease ([place]) [I73.9]   . Hyperlipidemia [E78.5]   . Hiatal hernia [K44.9]   . Edema [R60.9]   . Chronic diastolic (congestive) heart failure (HCC) [I50.32]   . Carpal tunnel syndrome [G56.00]   . Atrial fibrillation ([place]) [I48.91]   . Anemia [D64.9]   . Actinic keratosis [L57.0]   . Intractable nausea and vomiting [R11.2] 05/31/2016  . Nausea & vomiting [R11.2] 05/30/2016  . Abnormal chest x-ray [R93.89] 05/30/2016  . Diabetes mellitus with complication ([place]) [G01.7]   . Influenza A [J10.1] 05/21/2016  . Chronic atrial fibrillation ([place]) [I48.2] 05/20/2016  . Acute CHF (congestive heart failure) ([place]) [I50.9] 05/20/2016  . History of GI bleed [Z87.19] 03/29/2016  . Chronic anticoagulation [Z79.01] 03/29/2016  . Symptomatic anemia [D64.9] 03/19/2016  . Spinal stenosis of lumbar region [M48.061] 02/10/2016  . Varicose veins of left leg with edema [I83.892] 09/15/2015  . Preop cardiovascular exam [C94.496] 08/25/2015  . Iron deficiency anemia due to chronic blood loss  [D50.0] 07/17/2015  . Anemia of chronic renal failure, stage 3 (moderate) (HCC) [N18.3, D63.1] 07/17/2015  . Anticoagulant causing adverse effect in therapeutic use [T45.515A] 07/17/2015  . Symptomatic bradycardia [R00.1] 02/21/2015  . Bradycardia [R00.1]   . Acute deep vein thrombosis (DVT) of left lower extremity (HCC) [I82.402] 05/06/2014  . Abdominal wall mass of right lower quadrant [R19.03] 07/08/2013  . OA (osteoarthritis) of hip [M16.9] 04/03/2013  . Osteoarthritis of hip [M16.9] 01/18/2013  . Night sweats [R61] 09/06/2012  . Chest pain [R07.9]   . Anxiety and depression [F41.9, F32.9]   . Gastroesophageal reflux disease [K21.9]   . Hepatitis [K75.9]   . Jaundice [R17]   . Hypertension [I10] 07/01/2010  . Hypercholesterolemia [E78.00] 07/01/2010  . DM2 (diabetes mellitus, type 2) ([place]) [E11.9] 07/01/2010  . Rheumatoid arthritis ([place]) [M06.9] 07/01/2010  . Sinus tachycardia [R00.0] 07/01/2010  . Chest pain, atypical [R07.89] 07/01/2010    Total Time spent with patient: 1 hour  Subjective:   [name] is a 72 y.o. female patient admitted with atrial fibrillation with RVR.  HPI:   Per chart review, patient has been confused with accompanying hallucinations since her stroke in March. She was admitted to the hospital from 3/13-3/18 and then completed CIR (3/18-4/3). MOCA was 10/30 at time of hospitalization. She has been seeing potted plants in her room. She sees someone named [name]. Prior to her stroke, she was independent of her ADLs and IADLs and was living with her boyfriend. Home medications include Wellbutrin SR 150 mg BID.   On interview, [name] denies current AVH. She reports an improvement in her vision deficits while  completing CIR. She reports believing that she heard someone outside her window on Friday but now realizes it was likely a tree blowing in the wind. She denies seeing someone named "[name]." She reports that her mood is good. She denies SI, HI or AVH.  She reports no problems with sleep or appetite. She is oriented to person, place and time.   Past Psychiatric History: Anxiety   Risk to Self: Is patient at risk for suicide?: No Risk to Others:  None. Denies HI.  Prior Inpatient Therapy:  Denies  Prior Outpatient Therapy:  Unknown  Past Medical History:  Past Medical History:  Diagnosis Date  . Abnormal LFTs 07/24/2017  . Actinic keratosis   . Anemia    hx of  . Anemia of chronic renal failure, stage 3 (moderate) ([place]) 07/17/2015  . Anticoagulant causing adverse effect in therapeutic use 07/17/2015  . Anxiety   . Arthritis    rheumatoid  . Atrial fibrillation ([place])    a. persistent, she remains on Xarelto  . Carpal tunnel syndrome   . Chronic diastolic (congestive) heart failure ([place])    a. 04/2016: echo showing a preserved EF of 55-60% with mild MR. LA and RA midly dilated.   . Edema    left leg at ankle resolved now  . Gastroesophageal reflux disease   . Hepatitis 1970   not sure what kind  . Hiatal hernia   . Hyperlipidemia   . Hypertension   . Iron deficiency anemia due to chronic blood loss 07/17/2015  . Jaundice    age 66  . Peripheral vascular disease ([place]) yrs ago   DVT left lower leg questionale told by 2 drs i had no clot, 1 md said i did  . Rheumatoid arthritis(714.0)   . Right knee pain   . Stroke ([place])   . Type II diabetes mellitus ([place])    type2    Past Surgical History:  Procedure Laterality Date  . CATARACT EXTRACTION Bilateral   . COLONOSCOPY WITH PROPOFOL N/A 02/20/2015   Procedure: COLONOSCOPY WITH PROPOFOL;  Surgeon: Carol Ada, MD;  Location: [facility];  Service: Endoscopy;  Laterality: N/A;  . ENTEROSCOPY N/A 03/23/2016   Procedure: ENTEROSCOPY;  Surgeon: Carol Ada, MD;  [facility];  Service: Endoscopy;  Laterality: N/A;  . ENTEROSCOPY N/A 06/17/2016   Procedure: ENTEROSCOPY;  Surgeon: Carol Ada, MD;  [facility];  Service: Endoscopy;  Laterality: N/A;  .  ESOPHAGOGASTRODUODENOSCOPY (EGD) WITH PROPOFOL N/A 02/20/2015   Procedure: ESOPHAGOGASTRODUODENOSCOPY (EGD) WITH PROPOFOL;  Surgeon: Carol Ada, MD;  [facility];  Service: Endoscopy;  Laterality: N/A;  . GIVENS CAPSULE STUDY N/A 03/21/2016   Procedure: GIVENS CAPSULE STUDY;  Surgeon: Carol Ada, MD;  [facility];  Service: Endoscopy;  Laterality: N/A;  . HAND SURGERY  1995 and 1996   artificial joints both hands  . JOINT REPLACEMENT    . LUMBAR LAMINECTOMY/DECOMPRESSION MICRODISCECTOMY N/A 02/10/2016   Procedure: MICROLUMBAR DECOMPRESSION L4-L5 AND L3- L4, AND EXCISION OF SYNOVIAL CYST L4-L5;  Surgeon: Susa Day, MD;  Location[facility];  Service: Orthopedics;  Laterality: N/A;  . TOTAL HIP ARTHROPLASTY Left 2009  . TOTAL HIP ARTHROPLASTY Right 04/03/2013   Procedure: RIGHT TOTAL HIP ARTHROPLASTY ANTERIOR APPROACH;  Surgeon: Gearlean Alf, MD;  [facility];  Service: Orthopedics;  Laterality: Right;   Family History:  Family History  Problem Relation Age of Onset  . Pneumonia Mother 35  . Stroke Father 29  . Heart failure Sister   .  Hypertension Sister   . Heart attack Neg Hx    Family Psychiatric  History: Denies  Social History:  Social History   Substance and Sexual Activity  Alcohol Use No  . Alcohol/week: 0.0 oz     Social History   Substance and Sexual Activity  Drug Use No    Social History   Socioeconomic History  . Marital status: Divorced    Spouse name: Not on file  . Number of children: Not on file  . Years of education: Not on file  . Highest education level: Not on file  Occupational History  . Occupation: Retired  Scientific laboratory technician  . Financial resource strain: Not on file  . Food insecurity:    Worry: Not on file    Inability: Not on file  . Transportation needs:    Medical: Not on file    Non-medical: Not on file  Tobacco Use  . Smoking status: Never Smoker  . Smokeless tobacco: Never Used  Substance and Sexual  Activity  . Alcohol use: No    Alcohol/week: 0.0 oz  . Drug use: No  . Sexual activity: Not Currently  Lifestyle  . Physical activity:    Days per week: Not on file    Minutes per session: Not on file  . Stress: Not on file  Relationships  . Social connections:    Talks on phone: Not on file    Gets together: Not on file    Attends religious service: Not on file    Active member of club or organization: Not on file    Attends meetings of clubs or organizations: Not on file    Relationship status: Not on file  Other Topics Concern  . Not on file  Social History Narrative  . Not on file   Additional Social History: She is living at a SNF. She is divorced. She has an adult son. She denies alcohol or illicit substance use.     Allergies:  No Known Allergies  Labs:  Results for orders placed or performed during the hospital encounter of 07/24/17 (from the past 48 hour(s))  CBC with Differential/Platelet     Status: Abnormal   Collection Time: 07/24/17  9:35 AM  Result Value Ref Range   WBC 11.6 (H) 4.0 - 10.5 K/uL   RBC 4.94 3.87 - 5.11 MIL/uL   Hemoglobin 12.3 12.0 - 15.0 g/dL   HCT 40.2 36.0 - 46.0 %   MCV 81.4 78.0 - 100.0 fL   MCH 24.9 (L) 26.0 - 34.0 pg   MCHC 30.6 30.0 - 36.0 g/dL   RDW 20.8 (H) 11.5 - 15.5 %   Platelets 237 150 - 400 K/uL   Neutrophils Relative % 66 %   Neutro Abs 7.6 1.7 - 7.7 K/uL   Lymphocytes Relative 25 %   Lymphs Abs 2.9 0.7 - 4.0 K/uL   Monocytes Relative 9 %   Monocytes Absolute 1.0 0.1 - 1.0 K/uL   Eosinophils Relative 0 %   Eosinophils Absolute 0.0 0.0 - 0.7 K/uL   Basophils Relative 0 %   Basophils Absolute 0.0 0.0 - 0.1 K/uL    Comment: Performed at [facility], [address], [place], [place] [address]  Comprehensive metabolic panel     Status: Abnormal   Collection Time: 07/24/17  9:35 AM  Result Value Ref Range   Sodium 135 135 - 145 mmol/L   Potassium 4.7 3.5 - 5.1 mmol/L   Chloride 103 101 -  111 mmol/L   CO2 14 (L)  22 - 32 mmol/L   Glucose, Bld 64 (L) 65 - 99 mg/dL   BUN 32 (H) 6 - 20 mg/dL   Creatinine, Ser 1.62 (H) 0.44 - 1.00 mg/dL   Calcium 8.6 (L) 8.9 - 10.3 mg/dL   Total Protein 6.1 (L) 6.5 - 8.1 g/dL   Albumin 3.1 (L) 3.5 - 5.0 g/dL   AST 502 (H) 15 - 41 U/L   ALT 482 (H) 14 - 54 U/L   Alkaline Phosphatase 132 (H) 38 - 126 U/L   Total Bilirubin 1.3 (H) 0.3 - 1.2 mg/dL   GFR calc non Af Amer 31 (L) >60 mL/min   GFR calc Af Amer 36 (L) >60 mL/min    Comment: (NOTE) The eGFR has been calculated using the CKD EPI equation. This calculation has not been validated in all clinical situations. eGFR's persistently <60 mL/min signify possible Chronic Kidney Disease.    Anion gap 18 (H) 5 - 15    Comment: Performed at [facility], [place] [address], [place], [place] [address]  I-stat troponin, ED     Status: None   Collection Time: 07/24/17 10:06 AM  Result Value Ref Range   Troponin i, poc 0.00 0.00 - 0.08 ng/mL   Comment 3            Comment: Due to the release kinetics of cTnI, a negative result within the first hours of the onset of symptoms does not rule out myocardial infarction with certainty. If myocardial infarction is still suspected, repeat the test at appropriate intervals.   I-stat chem 8, ed     Status: Abnormal   Collection Time: 07/24/17 10:07 AM  Result Value Ref Range   Sodium 139 135 - 145 mmol/L   Potassium 4.6 3.5 - 5.1 mmol/L   Chloride 106 101 - 111 mmol/L   BUN 39 (H) 6 - 20 mg/dL   Creatinine, Ser 1.60 (H) 0.44 - 1.00 mg/dL   Glucose, Bld 59 (L) 65 - 99 mg/dL   Calcium, Ion 0.83 (LL) 1.15 - 1.40 mmol/L   TCO2 18 (L) 22 - 32 mmol/L   Hemoglobin 13.6 12.0 - 15.0 g/dL   HCT 40.0 36.0 - 46.0 %   Comment NOTIFIED PHYSICIAN   Brain natriuretic peptide     Status: Abnormal   Collection Time: 07/24/17 10:07 AM  Result Value Ref Range   B Natriuretic Peptide 670.9 (H) 0.0 - 100.0 pg/mL    Comment: Performed at [facility], [address],  [place], [place] [address]  Urinalysis, Routine w reflex microscopic     Status: Abnormal   Collection Time: 07/24/17 12:13 PM  Result Value Ref Range   Color, Urine AMBER (A) YELLOW    Comment: BIOCHEMICALS MAY BE AFFECTED BY COLOR   APPearance CLOUDY (A) CLEAR   Specific Gravity, Urine 1.018 1.005 - 1.030   pH 5.0 5.0 - 8.0   Glucose, UA NEGATIVE NEGATIVE mg/dL   Hgb urine dipstick SMALL (A) NEGATIVE   Bilirubin Urine NEGATIVE NEGATIVE   Ketones, ur NEGATIVE NEGATIVE mg/dL   Protein, ur >=300 (A) NEGATIVE mg/dL   Nitrite NEGATIVE NEGATIVE   Leukocytes, UA NEGATIVE NEGATIVE   RBC / HPF 0-5 0 - 5 RBC/hpf   WBC, UA 6-30 0 - 5 WBC/hpf   Bacteria, UA NONE SEEN NONE SEEN   Squamous Epithelial / LPF 0-5 (A) NONE SEEN   Mucus PRESENT    Granular  Casts, UA PRESENT    Non Squamous Epithelial 0-5 (A) NONE SEEN    Comment: Performed at [facility], [place] [address], [place], [place] [address]  Urine rapid drug screen (hosp performed)     Status: None   Collection Time: 07/24/17 12:13 PM  Result Value Ref Range   Opiates NONE DETECTED NONE DETECTED   Cocaine NONE DETECTED NONE DETECTED   Benzodiazepines NONE DETECTED NONE DETECTED   Amphetamines NONE DETECTED NONE DETECTED   Tetrahydrocannabinol NONE DETECTED NONE DETECTED   Barbiturates NONE DETECTED NONE DETECTED    Comment: (NOTE) DRUG SCREEN FOR MEDICAL PURPOSES ONLY.  IF CONFIRMATION IS NEEDED FOR ANY PURPOSE, NOTIFY LAB WITHIN 5 DAYS. LOWEST DETECTABLE LIMITS FOR URINE DRUG SCREEN Drug Class                     Cutoff (ng/mL) Amphetamine and metabolites    1000 Barbiturate and metabolites    200 Benzodiazepine                 326 Tricyclics and metabolites     300 Opiates and metabolites        300 Cocaine and metabolites        300 THC                            50 Performed at [facility], [place] [address], [place], [place] [address]   Ammonia     Status: None   Collection Time: 07/24/17  2:13 PM  Result Value  Ref Range   Ammonia 24 9 - 35 umol/L    Comment: Performed at [facility], [place] [address], [place], [place] [address]  Troponin I     Status: Abnormal   Collection Time: 07/24/17  2:36 PM  Result Value Ref Range   Troponin I 0.07 (HH) <0.03 ng/mL    Comment: CRITICAL RESULT CALLED TO, READ BACK BY AND VERIFIED WITH: Janelle Floor 1744 07/24/17 D BRADLEY Performed at [facility], [place] [address], [place], [place] [address]   CBG monitoring, ED     Status: None   Collection Time: 07/24/17  3:33 PM  Result Value Ref Range   Glucose-Capillary 86 65 - 99 mg/dL  TSH     Status: None   Collection Time: 07/24/17  3:41 PM  Result Value Ref Range   TSH 2.241 0.350 - 4.500 uIU/mL    Comment: Performed by a 3rd Generation assay with a functional sensitivity of <=0.01 uIU/mL. Performed at [facility], [place] [address] [place], [place] [address]   Glucose, capillary     Status: Abnormal   Collection Time: 07/24/17  5:18 PM  Result Value Ref Range   Glucose-Capillary 51 (L) 65 - 99 mg/dL  Glucose, capillary     Status: None   Collection Time: 07/24/17  6:10 PM  Result Value Ref Range   Glucose-Capillary 90 65 - 99 mg/dL  Hepatic function panel     Status: Abnormal   Collection Time: 07/24/17  6:52 PM  Result Value Ref Range   Total Protein 6.3 (L) 6.5 - 8.1 g/dL   Albumin 3.2 (L) 3.5 - 5.0 g/dL   AST 467 (H) 15 - 41 U/L   ALT 486 (H) 14 - 54 U/L   Alkaline Phosphatase 137 (H) 38 - 126 U/L   Total Bilirubin 1.5 (H) 0.3 - 1.2 mg/dL  Bilirubin, Direct 0.6 (H) 0.1 - 0.5 mg/dL   Indirect Bilirubin 0.9 0.3 - 0.9 mg/dL    Comment: Performed at [facility] [address] [place], [place] [address]  Hepatitis panel, acute     Status: None   Collection Time: 07/24/17  6:52 PM  Result Value Ref Range   Hepatitis B Surface Ag Negative Negative   HCV Ab 0.2 0.0 - 0.9 s/co ratio    Comment: (NOTE)                                  Negative:     < 0.8                              Indeterminate: 0.8 - 0.9                                  Positive:     > 0.9 The CDC recommends that a positive HCV antibody result be followed up with a HCV Nucleic Acid Amplification test (027253). Performed At: [facility] [place], [place] [id number] Rush Farmer MD [id number]    Hep A IgM Negative Negative   Hep B C IgM Negative Negative    Comment: Performed at [facility], [place] [address] [place], [place] [address]  Troponin I     Status: Abnormal   Collection Time: 07/24/17  6:52 PM  Result Value Ref Range   Troponin I 0.09 (HH) <0.03 ng/mL    Comment: CRITICAL VALUE NOTED.  VALUE IS CONSISTENT WITH PREVIOUSLY REPORTED AND CALLED VALUE. Performed at [facility], [place] [address], [place], [place] [address]   Glucose, capillary     Status: None   Collection Time: 07/24/17  7:47 PM  Result Value Ref Range   Glucose-Capillary 76 65 - 99 mg/dL  Glucose, capillary     Status: Abnormal   Collection Time: 07/25/17 12:01 AM  Result Value Ref Range   Glucose-Capillary 42 (LL) 65 - 99 mg/dL  Troponin I     Status: Abnormal   Collection Time: 07/25/17 12:24 AM  Result Value Ref Range   Troponin I 0.09 (HH) <0.03 ng/mL    Comment: CRITICAL VALUE NOTED.  VALUE IS CONSISTENT WITH PREVIOUSLY REPORTED AND CALLED VALUE. Performed at [facility], [place] [address], [place], [place] [address]   Glucose, capillary     Status: Abnormal   Collection Time: 07/25/17  2:42 AM  Result Value Ref Range   Glucose-Capillary 55 (L) 65 - 99 mg/dL  Glucose, capillary     Status: Abnormal   Collection Time: 07/25/17  3:52 AM  Result Value Ref Range   Glucose-Capillary 47 (L) 65 - 99 mg/dL  Glucose, capillary     Status: None   Collection Time: 07/25/17  5:49 AM  Result Value Ref Range   Glucose-Capillary 83 65 - 99 mg/dL  CBC     Status: Abnormal   Collection Time: 07/25/17  5:52 AM  Result Value Ref Range   WBC 9.2 4.0 - 10.5 K/uL   RBC  4.78 3.87 - 5.11 MIL/uL   Hemoglobin 11.9 (L) 12.0 - 15.0 g/dL    Comment: REPEATED TO VERIFY   HCT 39.0 36.0 - 46.0 %   MCV 81.6  78.0 - 100.0 fL   MCH 24.9 (L) 26.0 - 34.0 pg   MCHC 30.5 30.0 - 36.0 g/dL   RDW 20.8 (H) 11.5 - 15.5 %   Platelets 186 150 - 400 K/uL    Comment: REPEATED TO VERIFY Performed at [facility] [address] [place], [place] [address]   Comprehensive metabolic panel     Status: Abnormal   Collection Time: 07/25/17  5:52 AM  Result Value Ref Range   Sodium 139 135 - 145 mmol/L   Potassium 2.6 (LL) 3.5 - 5.1 mmol/L    Comment: CRITICAL RESULT CALLED TO, READ BACK BY AND VERIFIED WITH: Ollen Gross [id number] [facility]    Chloride 106 101 - 111 mmol/L   CO2 21 (L) 22 - 32 mmol/L   Glucose, Bld 85 65 - 99 mg/dL   BUN 25 (H) 6 - 20 mg/dL   Creatinine, Ser 2.07 (H) 0.44 - 1.00 mg/dL   Calcium 8.5 (L) 8.9 - 10.3 mg/dL   Total Protein 5.5 (L) 6.5 - 8.1 g/dL   Albumin 2.8 (L) 3.5 - 5.0 g/dL   AST 380 (H) 15 - 41 U/L   ALT 428 (H) 14 - 54 U/L   Alkaline Phosphatase 126 38 - 126 U/L   Total Bilirubin 1.5 (H) 0.3 - 1.2 mg/dL   GFR calc non Af Amer 23 (L) >60 mL/min   GFR calc Af Amer 26 (L) >60 mL/min    Comment: (NOTE) The eGFR has been calculated using the CKD EPI equation. This calculation has not been validated in all clinical situations. eGFR's persistently <60 mL/min signify possible Chronic Kidney Disease.    Anion gap 12 5 - 15    Comment: Performed at [facility] [address], [place], [place] [address]  Troponin I     Status: Abnormal   Collection Time: 07/25/17  5:52 AM  Result Value Ref Range   Troponin I 0.06 (HH) <0.03 ng/mL    Comment: CRITICAL RESULT CALLED TO, READ BACK BY AND VERIFIED WITH: Ollen Gross [id number] [facility] Performed at [facility], [place] [address], [place], [place] [address]   Glucose, capillary     Status: Abnormal   Collection Time: 07/25/17  7:37 AM  Result Value Ref Range    Glucose-Capillary 53 (L) 65 - 99 mg/dL  Glucose, capillary     Status: Abnormal   Collection Time: 07/25/17  8:46 AM  Result Value Ref Range   Glucose-Capillary 120 (H) 65 - 99 mg/dL  APTT     Status: Abnormal   Collection Time: 07/25/17  9:09 AM  Result Value Ref Range   aPTT 60 (H) 24 - 36 seconds    Comment:        IF BASELINE aPTT IS ELEVATED, SUGGEST PATIENT RISK ASSESSMENT BE USED TO DETERMINE APPROPRIATE ANTICOAGULANT THERAPY. Performed at [facility], [place] [address], [place], [place] [address]   Heparin level (unfractionated)     Status: None   Collection Time: 07/25/17  9:09 AM  Result Value Ref Range   Heparin Unfractionated 0.51 0.30 - 0.70 IU/mL    Comment:        IF HEPARIN RESULTS ARE BELOW EXPECTED VALUES, AND PATIENT DOSAGE HAS BEEN CONFIRMED, SUGGEST FOLLOW UP TESTING OF ANTITHROMBIN III LEVELS. Performed at [facility], [place] [address], [place], [place] [address]   Troponin I (q 6hr x 3)     Status: Abnormal  Collection Time: 07/25/17 10:14 AM  Result Value Ref Range   Troponin I 0.06 (HH) <0.03 ng/mL    Comment: CRITICAL VALUE NOTED.  VALUE IS CONSISTENT WITH PREVIOUSLY REPORTED AND CALLED VALUE. Performed at [facility], [place] [address] [place], [place] [address]   Glucose, capillary     Status: None   Collection Time: 07/25/17 11:26 AM  Result Value Ref Range   Glucose-Capillary 82 65 - 99 mg/dL    Current Facility-Administered Medications  Medication Dose Route Frequency Provider Last Rate Last Dose  . [START ON 07/27/2017] Abatacept SOAJ 125 mg  125 mg Subcutaneous Q Thu Winfrey, Amanda C, MD      . buPROPion [facility] SR) 12 hr tablet 150 mg  150 mg Oral BID Kathrene Alu, MD   150 mg at 07/25/17 1143  . cholecalciferol (VITAMIN D) tablet 1,000 Units  1,000 Units Oral QPM Winfrey, Amanda C, MD      . dextrose 5 %-0.45 % sodium chloride infusion   Intravenous Continuous Everrett Coombe, MD 50 mL/hr at 07/25/17 2426    .  diclofenac sodium (VOLTAREN) 1 % transdermal gel 2 g  2 g Topical QHS PRN Kathrene Alu, MD      . diltiazem (CARDIZEM) 100 mg in dextrose 5% 110m (1 mg/mL) infusion  5-15 mg/hr Intravenous Continuous WKathrene Alu MD 10 mL/hr at 07/25/17 1128 10 mg/hr at 07/25/17 1128  . ferrous gluconate (FERGON) tablet 324 mg  324 mg Oral BID WC Winfrey, AEstill BambergC, MD   324 mg at 07/25/17 1143  . heparin ADULT infusion 100 units/mL (25000 units/2533msodium chloride 0.45%)  1,100 Units/hr Intravenous Continuous NeDickie LaMD 11 mL/hr at 07/25/17 0240 1,100 Units/hr at 07/25/17 0240  . meclizine (ANTIVERT) tablet 12.5 mg  12.5 mg Oral Q4H PRN WiKathrene AluMD      . metoprolol tartrate (LOPRESSOR) tablet 100 mg  100 mg Oral BID WiKathrene AluMD      . nitroGLYCERIN (NITROSTAT) SL tablet 0.4 mg  0.4 mg Sublingual Q5 min PRN WiKathrene AluMD      . ondansetron (ZOFRAN) injection 4 mg  4 mg Intravenous Q6H PRN WiKathrene AluMD      . pantoprazole (PROTONIX) EC tablet 40 mg  40 mg Oral Daily WiKathrene AluMD   40 mg at 07/25/17 1143  . predniSONE (DELTASONE) tablet 5 mg  5 mg Oral Daily WiKathrene AluMD   5 mg at 07/25/17 1143  . senna-docusate (Senokot-S) tablet 1 tablet  1 tablet Oral QHS PRN Winfrey, AmAlcario DroughtMD      . vitamin B-12 (CYANOCOBALAMIN) tablet 1,000 mcg  1,000 mcg Oral QPM Winfrey, AmAlcario DroughtMD        Musculoskeletal: Strength & Muscle Tone: decreased Gait & Station: UTA due to patient lying in bed. Patient leans: N/A  Psychiatric Specialty Exam: Physical Exam  Nursing note and vitals reviewed. Constitutional: She is oriented to person, place, and time. She appears well-developed and well-nourished.  HENT:  Head: Normocephalic and atraumatic.  Neck: Normal range of motion.  Respiratory: Effort normal.  Musculoskeletal: Normal range of motion.  Neurological: She is alert and oriented to person, place, and time.  Psychiatric: She has a normal mood and  affect. Her speech is normal and behavior is normal. Judgment and thought content normal. Cognition and memory are normal.    Review of Systems  Constitutional: Negative for chills and fever.  Cardiovascular: Negative for chest pain.  Gastrointestinal: Negative for abdominal pain, constipation, diarrhea, nausea and vomiting.  Psychiatric/Behavioral: Negative for depression, hallucinations, substance abuse and suicidal ideas. The patient is not nervous/anxious and does not have insomnia.   All other systems reviewed and are negative.   Blood pressure 140/80, [name] (!) 105, temperature 98.2 F (36.8 C), temperature source Oral, resp. rate (!) 22, height '5\' 7"'  (1.702 m), weight 79 kg (174 lb 2.6 oz), SpO2 100 %.Body mass index is 27.28 kg/m.  General Appearance: Fairly Groomed, elderly, Caucasian female, wearing a hospital gown and lying in bed. NAD.   Eye Contact:  Good  Speech:  Clear and Coherent and Normal Rate  Volume:  Normal  Mood:  Euthymic  Affect:  Appropriate and Full Range  Thought Process:  Goal Directed, Linear and Descriptions of Associations: Intact  Orientation:  Full (Time, Place, and Person)  Thought Content:  Logical  Suicidal Thoughts:  No  Homicidal Thoughts:  No  Memory:  Immediate;   Good Recent;   Good Remote;   Good  Judgement:  Fair  Insight:  Fair  Psychomotor Activity:  Normal  Concentration:  Concentration: Good and Attention Span: Good  Recall:  Good  Fund of Knowledge:  Good  Language:  Good  Akathisia:  No  Handed:  Right  AIMS (if indicated):   N/A  Assets:  Communication Skills Housing Social Support  ADL's:  Impaired  Cognition:  WNL  Sleep:   Okay   Assessment:  [name] is a 72 y.o. female who was admitted with atrial fibrillation with RVR. Psychiatry was consulted for confusion with AVH s/p stroke. Patient denies SI, HI or AVH. She is oriented to person, place and time. It appears that her periods of confusion tend to fluctuate  which is most consistent with delirium. She may benefit from a low dose antipsychotic as needed in the future if hallucinations return and they are distressing to patient.   Treatment Plan Summary: -Recommend Seroquel 25 mg TID PRN for hallucinations that are distressing to patient. She denies current AVH. -Consider referral to outpatient psychiatrist to further evaluate and manage hallucinations if return. -Continue Wellbutrin SR 150 mg BID for mood.  -Patient is psychiatrically cleared. Psychiatry will sign off on patient at this time. Please consult psychiatry again as needed.   Disposition: No evidence of imminent risk to self or others at present.   Patient does not meet criteria for psychiatric inpatient admission.  Faythe Dingwall, DO 07/25/2017 1:32 PM

## 2017-07-26 ENCOUNTER — Other Ambulatory Visit: Payer: Self-pay

## 2017-07-26 DIAGNOSIS — R443 Hallucinations, unspecified: Secondary | ICD-10-CM

## 2017-07-26 LAB — CBC
HEMATOCRIT: 34 % — AB (ref 36.0–46.0)
Hemoglobin: 10.4 g/dL — ABNORMAL LOW (ref 12.0–15.0)
MCH: 24.7 pg — ABNORMAL LOW (ref 26.0–34.0)
MCHC: 30.6 g/dL (ref 30.0–36.0)
MCV: 80.8 fL (ref 78.0–100.0)
Platelets: 173 10*3/uL (ref 150–400)
RBC: 4.21 MIL/uL (ref 3.87–5.11)
RDW: 20.7 % — AB (ref 11.5–15.5)
WBC: 9.4 10*3/uL (ref 4.0–10.5)

## 2017-07-26 LAB — GLUCOSE, CAPILLARY
GLUCOSE-CAPILLARY: 163 mg/dL — AB (ref 65–99)
Glucose-Capillary: 82 mg/dL (ref 65–99)
Glucose-Capillary: 221 mg/dL — ABNORMAL HIGH (ref 65–99)
Glucose-Capillary: 219 mg/dL — ABNORMAL HIGH (ref 65–99)

## 2017-07-26 LAB — COMPREHENSIVE METABOLIC PANEL
ALK PHOS: 176 U/L — AB (ref 38–126)
ALT: 272 U/L — ABNORMAL HIGH (ref 14–54)
AST: 151 U/L — ABNORMAL HIGH (ref 15–41)
Albumin: 2.4 g/dL — ABNORMAL LOW (ref 3.5–5.0)
Anion gap: 14 (ref 5–15)
BILIRUBIN TOTAL: 1.8 mg/dL — AB (ref 0.3–1.2)
BUN: 20 mg/dL (ref 6–20)
CALCIUM: 8.4 mg/dL — AB (ref 8.9–10.3)
CO2: 16 mmol/L — ABNORMAL LOW (ref 22–32)
Chloride: 108 mmol/L (ref 101–111)
Creatinine, Ser: 1.46 mg/dL — ABNORMAL HIGH (ref 0.44–1.00)
GFR calc Af Amer: 40 mL/min — ABNORMAL LOW (ref >60)
GFR, EST NON AFRICAN AMERICAN: 35 mL/min — AB (ref >60)
Glucose, Bld: 87 mg/dL (ref 65–99)
POTASSIUM: 3.8 mmol/L (ref 3.5–5.1)
Sodium: 138 mmol/L (ref 135–145)
TOTAL PROTEIN: 5.2 g/dL — AB (ref 6.5–8.1)

## 2017-07-26 LAB — TROPONIN I: Troponin I: 0.03 ng/mL (ref <0.03)

## 2017-07-26 LAB — HEPARIN LEVEL (UNFRACTIONATED): Heparin Unfractionated: 0.37 IU/mL (ref 0.30–0.70)

## 2017-07-26 LAB — HEPATITIS PANEL, ACUTE
HCV AB: 0.1 {s_co_ratio} (ref 0.0–0.9)
HEP B S AG: NEGATIVE
Hep A IgM: NEGATIVE
Hep B C IgM: NEGATIVE

## 2017-07-26 LAB — MRSA PCR SCREENING: MRSA by PCR: NEGATIVE

## 2017-07-26 MED ORDER — DILTIAZEM HCL ER COATED BEADS 180 MG PO CP24
360.0000 mg | ORAL_CAPSULE | Freq: Every day | ORAL | Status: DC
  Administered 2017-07-26 – 2017-07-27 (×2): 360 mg via ORAL
  Filled 2017-07-26 (×2): qty 2

## 2017-07-26 MED ORDER — RIVAROXABAN 15 MG PO TABS
15.0000 mg | ORAL_TABLET | Freq: Every day | ORAL | Status: DC
  Administered 2017-07-26 – 2017-07-27 (×2): 15 mg via ORAL
  Filled 2017-07-26 (×2): qty 1

## 2017-07-26 NOTE — Progress Notes (Signed)
  Speech Language Pathology Treatment: Dysphagia  Patient Details Name: [name] MRN: [id number] DOB: [birth year] Today's Date: 07/26/2017 Time: 1710-1730 SLP Time Calculation (min) (ACUTE ONLY): 20 min  Assessment / Plan / Recommendation Clinical Impression  Skilled treatment session focused on dysphagia goals. SLP facilitated session by providing skilled observation of pt consuming graham crackers with peanut butter. Pt continues with impaired mentation and is not responsive to cues for small bites or complete oral clearing prior to taking another bite. Given decreased ability to follow/use compensatory swallow strategies, recommend continuing most conservative diet of dysphagia 3. ST to continue to follow.    HPI HPI: Pt is a 72 y.o. female presenting with atrial fibrillation with RVR. Per chart review, pt would cough at SNF when taking multiple pills together. CXR on admission without concern for PNA. PMH is significant for atrial fibrillation, altered mental status, recent embolic stroke (seen by SLP for cognition, not swallowing), CKD3, HFpEF, HTN, HLD, T2DM, GERD, RA, Anxiety/Depression, frequent falls.      SLP Plan  Continue with current plan of care       Recommendations  Diet recommendations: Dysphagia 3 (mechanical soft);Thin liquid Liquids provided via: Straw Medication Administration: Whole meds with puree Supervision: Patient able to self feed;Staff to assist with self feeding;Full supervision/cueing for compensatory strategies Compensations: Minimize environmental distractions;Slow rate;Small sips/bites;Follow solids with liquid Postural Changes and/or Swallow Maneuvers: Seated upright 90 degrees                Oral Care Recommendations: Oral care BID Follow up Recommendations: Skilled Nursing facility SLP Visit Diagnosis: Dysphagia, unspecified (R13.10) Plan: Continue with current plan of care       [place] 07/26/2017, 6:15  PM

## 2017-07-26 NOTE — Progress Notes (Addendum)
Family Medicine Teaching Service Daily Progress Note Intern Pager: [phone]  Patient name: [name] Medical record number: [id number] Date of birth: 1945-11-12 Age: 72 y.o. Gender: female  Primary Care Provider: Aletha Halim., PA-C Consultants: cardiology Code Status: partial (DNI)  Pt Overview and Major Events to Date:  Admitted 4/22  Assessment and Plan:  DEMICA ZOOK is a 72 y.o. female presenting with atrial fibrillation with RVR. PMH is significant for atrial fibrillation, altered mental status, recent embolic stroke, CKD3, HFpEF, HTN, HLD, T2DM, GERD, RA, Anxiety/Depression, frequent falls.  Atrial fibrillation with RVR  Atrial fibrillation with rate at 161 on admission, controlled with a Cardizem drip with resulting heart rate in upper 90s to low 100s.  Cardiology following. - transition from Cardizem drip to home Cardizem 360 mg daily - continue PO Lopressor 100 mg BID - Xarelto 15 mg  Altered Mental Status Likely a mixture of vascular dementia with hypoglycemia, although other factors could be involved.  SNF nurse reported that she has been altered and has been hallucinating since being at their facility.  Psychiatry assessed patient on 4/23 and started Seroquel QHS for delirium and hallucinations which are disturbing to the patient; patient has not required seroquel, and hallucinations have not been disturbing.  Patient is lucid and completely alert and oriented on 4/24, saying that she does not have memory of her admission.   - hold gabapentin due to AKI - continue Wellbutrin 150 mg BID  Hypoglycemia Patient's blood sugar was consistently low on night of admission, ranging from 47 to 120 but mostly in the 50-80 range.  Likely due to Amaryl dose on 4/23 combined with poor PO intake and reduced clearance due to AKI.  Blood sugars have been increased since patient has been eating - continue CBG monitoring TID AC and QHS with sensitive SSI  Chest pain Patient  has reported chest pain over the past three days with accompanying nausea.  Likely due to heart palpitations.  EKG difficult to interpret since patient was in RVR but did not show signs of ischemia. Troponins are flat. - appreciate cardiology recommendations  Elevated Liver enzymes - uncertain etiology. Patient endorses vomiting x1. May be due to intravascular depletion vs hepatic congestion from CHF.  LFTs are trending down with AST [phone], ALT [phone].  - follow daily CMP - stop Arava due to hepatic injury  HFpEF Echo performed on 4/22 significant for EF 50-55%, although could be falsely decreased due to atrial fibrillation.  RV systolic pressure was increased, consistent with mild pulmonary hypertension.  No crackles heard on lung exam.  CXR notable for mild interstitial edema with enlarged cardiac silhouette.  BNP elevated from baseline at 670.9 (baseline appears to be 200-250).  Given Lasix 20 mg IV on 4/22. - strict I's and O's - daily weights  AKI Patient with creatinine of 1.62>1.46 on 4/24, baseline appears to be about 0.70-0.80.  This is likely prerenal in the setting of dehydration/intravascular depletion.  Patient has not been eating or drinking well and appears dry on exam. - repeat CMP in am - monitor with diuresis - Xarelto 15 mg daily due to reduced creatinine clearance   Abdominal pain Patient reported epigastric pain over the past three days with a loss of appetite.  Had nausea with some vomiting on 4/21.  Has a history of GERD for which she takes Nexium 40 mg daily.   - hepatitis panel - am CMP  HTN Chronic. Blood pressures have ranged from 98/71 - 168/91  since admission.  Home medications include metoprolol tartrate 100mg  BID and diltiazem 360mg  daily - continue Lopressor 100 mg BID - Diltiazem 360 mg PO  Type II DM Last A1c from March 2019 was 6.9.  Patient takes Amaryl at home. - Hold home Amaryl - Monitor CBG, sensitive SSI  HLD Lipid panel from  March 2019 with total cholesterol 143, HDL 31, LDL 66, TG 229 - hold home Crestor in setting of elevated LFTs  Rheumatoid arthritis Chronic. Stable.  Patient takes Keith Rake and prednisone at home - will stop home [place] since this medication can cause liver injury - prednisone 5 mg daily - Continue Orencia injection q Thursdayif patient still in hospital  Anxiety/Depression Takes Wellbutrin 150 mg BID at home.  Unknown if patient has exhibited post-stroke depression. - continue home Wellbutrin  Chronic anemia Takes Fe supplement at home.  Hgb 12.3, which is slightly above her baseline, likely heme-concentrated due to dehydration. - continue to monitor - continue home Fe  Frequent falls Patient has sustained multiple falls in the past few months, with her last fall about one week ago resulting in a broken R second toe.  Patient uses walker for ambulation.  CT head negative for bleed. - up only with assistance  Hypokalemia, resolved Potassium 2.6 on 4/23, 3.8 on 4/24. - daily CMP  FEN/GI: dysphagia 3 diet Prophylaxis: xarelto  Disposition: possible transfer to telemetry once off of Cardizem drip  Subjective:  Patient says she feels much better and has no memory of why she was admitted, although it was explained to her that her atrial fibrillation with RVR was the reason for admission.  She would like to go home with son when discharged and then go back to [facility].  Objective: Temp:  [98.2 F (36.8 C)-99.1 F (37.3 C)] 98.7 F (37.1 C) (04/24 0743) Pulse Rate:  [82-128] 100 (04/24 0743) Resp:  [15-26] 15 (04/24 0743) BP: (100-140)/(64-88) 119/80 (04/24 0743) SpO2:  [93 %-100 %] 97 % (04/24 0743) Weight:  [163 lb 1.6 oz (74 kg)] 163 lb 1.6 oz (74 kg) (04/24 0313) Physical Exam: General: sitting on side of bed eating breakfast, AAOX3 Cardiovascular: irregular rhythm, normal rate Respiratory: CTAB Abdomen: nontender, nondistended Extremities: 1+ pedal  edema  Laboratory: Recent Labs  Lab 07/24/17 0935 07/24/17 1007 07/25/17 0552 07/26/17 0346  WBC 11.6*  --  9.2 9.4  HGB 12.3 13.6 11.9* 10.4*  HCT 40.2 40.0 39.0 34.0*  PLT 237  --  186 173   Recent Labs  Lab 07/24/17 1852 07/25/17 0552 07/25/17 1623 07/26/17 0346  NA  --  139 137 138  K  --  2.6* 3.3* 3.8  CL  --  106 105 108  CO2  --  21* 19* 16*  BUN  --  25* 22* 20  CREATININE  --  2.07* 1.43* 1.46*  CALCIUM  --  8.5* 8.1* 8.4*  PROT 6.3* 5.5*  --  5.2*  BILITOT 1.5* 1.5*  --  1.8*  ALKPHOS 137* 126  --  176*  ALT 486* 428*  --  272*  AST 467* 380*  --  151*  GLUCOSE  --  85 239* 87    Imaging/Diagnostic Tests: Ct Head Wo Contrast  Result Date: 07/24/2017 CLINICAL DATA:  Altered mental status.  Nausea and vomiting. EXAM: CT HEAD WITHOUT CONTRAST TECHNIQUE: Contiguous axial images were obtained from the base of the skull through the vertex without intravenous contrast. COMPARISON:  Brain MRI and head CT 06/14/2017 FINDINGS:  Brain: Known recent infarcts in the right occipital lobe and temporoparietal region. No visible hemorrhagic conversion. Small remote right inferior cerebellar infarct. No evidence of acute infarct, hemorrhage, hydrocephalus, or mass. Vascular: Atherosclerotic calcification.  No hyperdense vessel. Skull: No acute or aggressive finding Sinuses/Orbits: Bilateral cataract resection. IMPRESSION: 1. No acute finding. 2. Known recent right cerebral infarcts. Electronically Signed   By: Monte Fantasia M.D.   On: 07/24/2017 11:54   Dg Chest Port 1 View  Result Date: 07/24/2017 CLINICAL DATA:  Onset of shortness of breath and left-sided chest pain this morning. History of atrial fibrillation, diabetes, and hypertension. Nonsmoker. EXAM: PORTABLE CHEST 1 VIEW COMPARISON:  PA and lateral chest x-ray of July 11, 2016 FINDINGS: The lungs are adequately inflated. The interstitial markings are mildly increased. The cardiac silhouette is enlarged. The pulmonary  vascularity is mildly prominent. There is no definite pleural effusion. The mediastinum is normal in width. The observed bony thorax exhibits no acute abnormality. There is an old fracture of the lateral aspect of the left third rib. IMPRESSION: Findings suggestive of low-grade CHF with mild interstitial edema. No alveolar pneumonia nor pneumothorax. Electronically Signed   By: David  Martinique M.D.   On: 07/24/2017 10:02     Kathrene Alu, MD 07/26/2017, 8:12 AM PGY-1, [place] Intern pager: [phone], text pages welcome

## 2017-07-26 NOTE — Progress Notes (Signed)
CSW called patient's son, Fara Olden, and left voicemail this afternoon; awaiting call back. CSW attempting to follow up regarding family's preference for discharge plan. Family was to follow up with Medicaid re: patient's long term care benefits. If patient does not have long term medicaid benefits, patient will have a Medicare co-pay if returning to SNF. Family also has the option to pay out of pocket for home health aide services. CSW did review these options with family during phone call yesterday.  Estanislado Emms, [place]

## 2017-07-26 NOTE — Progress Notes (Addendum)
0215 Pt sleeping, NAD.   0500 Pt sitting up on side of bed. Updated with POC. No complaints.   0645 Pt resting comfortably, no needs at this time. Awaiting day shift RN for report.

## 2017-07-26 NOTE — Progress Notes (Signed)
Nutrition Brief Note  Patient identified on the Low Braden Report (score < 12).   Wt Readings from Last 15 Encounters:  07/26/17 163 lb 1.6 oz (74 kg)  07/05/17 172 lb 9.9 oz (78.3 kg)  06/15/17 171 lb 1.2 oz (77.6 kg)  05/31/17 180 lb (81.6 kg)  04/19/17 180 lb 3.2 oz (81.7 kg)  09/09/16 183 lb (83 kg)  06/17/16 171 lb (77.6 kg)  05/31/16 171 lb 14.4 oz (78 kg)  05/26/16 173 lb 12.8 oz (78.8 kg)  05/16/16 178 lb 2.1 oz (80.8 kg)  05/11/16 180 lb 12.8 oz (82 kg)  03/29/16 171 lb 9.6 oz (77.8 kg)  03/23/16 177 lb (80.3 kg)  02/10/16 181 lb (82.1 kg)  02/03/16 181 lb (82.1 kg)   [name] is a 72 y.o. female presenting with atrial fibrillation with RVR. PMH is significant for atrial fibrillation, altered mental status, recent embolic stroke, CKD3, HFpEF, HTN, HLD, T2DM, GERD, RA, Anxiety/Depression, frequent falls.  Pt admitted with a-fib with RVR.   Spoke with pt and family member at bedside. Pt reports great appetite PTA. She had been residing at [facility] over the past 2-3 weeks for short term rehab s/p stroke. She reports making good progress at SNF.   Pt underwent BSE on 07/25/17; pt denies any difficulty chewing or swallowing foods. She reports UBW around 170-175#. She endorses wt loss due to consuming healthier food items to assist with DM control. Noted pt has experienced a 9.4% wt loss over the past 3 months, which is significant for time frame.   Last Hgb A1c: 6.9 (06/15/17). PTA DM medications 2 mg amayryl daily. Per DM coordinator note, recommending d/c medications due to hypoglycemic risk.   Nutrition-Focused physical exam completed. Findings are no fat depletion, no muscle depletion, and mild edema.   Labs reviewed: CBGS: 82-221 (inaptient orders for glycemic control are 0-9 units insulin aspart TID with meals  Body mass index is 25.55 kg/m. Patient meets criteria for overweight based on current BMI.   Current diet order is dysphagia 3, patient is  consuming approximately 75-100% of meals at this time. Labs and medications reviewed.   No nutrition interventions warranted at this time. If nutrition issues arise, please consult RD.   Kennie Snedden A. Jimmye Norman, RD, LDN, CDE Pager: [phone] After hours Pager: [phone]

## 2017-07-26 NOTE — Progress Notes (Addendum)
Progress Note  Patient Name: [name] Date of Encounter: 07/26/2017  Primary Cardiologist: Kirk Ruths, MD   Subjective   Much more alert this morning.  She denies any chest pain or shortness of breath  Inpatient Medications    Scheduled Meds: . [START ON 07/27/2017] Abatacept  125 mg Subcutaneous Q Thu  . buPROPion  150 mg Oral BID  . cholecalciferol  1,000 Units Oral QPM  . ferrous gluconate  324 mg Oral BID WC  . insulin aspart  0-9 Units Subcutaneous TID WC  . metoprolol tartrate  100 mg Oral BID  . pantoprazole  40 mg Oral Daily  . predniSONE  5 mg Oral Daily  . cyanocobalamin  1,000 mcg Oral QPM   Continuous Infusions: . diltiazem (CARDIZEM) infusion 15 mg/hr (07/26/17 0505)  . heparin 1,100 Units/hr (07/25/17 2159)   PRN Meds: diclofenac sodium, meclizine, nitroGLYCERIN, ondansetron (ZOFRAN) IV, phenol, QUEtiapine, senna-docusate, sodium chloride   Vital Signs    Vitals:   07/25/17 2000 07/26/17 0005 07/26/17 0313 07/26/17 0743  BP: 101/88 100/64 121/65 119/80  Pulse: (!) 128 82 94 100  Resp: (!) 24 (!) 23 (!) 26 15  Temp: 99.1 F (37.3 C) 98.9 F (37.2 C) 99.1 F (37.3 C) 98.7 F (37.1 C)  TempSrc: Oral Oral Oral Oral  SpO2: 96% 95% 96% 97%  Weight:   163 lb 1.6 oz (74 kg)   Height:        Intake/Output Summary (Last 24 hours) at 07/26/2017 0936 Last data filed at 07/26/2017 0700 Gross per 24 hour  Intake 1180 ml  Output 1600 ml  Net -420 ml   Filed Weights   07/24/17 1725 07/25/17 0434 07/26/17 0313  Weight: 177 lb 11.1 oz (80.6 kg) 174 lb 2.6 oz (79 kg) 163 lb 1.6 oz (74 kg)    Telemetry    Atrial fibrillation - Personally Reviewed  ECG    No new EKG to review - Personally Reviewed  Physical Exam   GEN: No acute distress.   Neck: No JVD Cardiac: irregularly irregular, no murmurs, rubs, or gallops.  Respiratory: Clear to auscultation bilaterally. GI: Soft, nontender, non-distended  MS: No edema; No deformity. Neuro:   Nonfocal  Psych: Normal affect   Labs    Chemistry Recent Labs  Lab 07/24/17 1852 07/25/17 0552 07/25/17 1623 07/26/17 0346  NA  --  139 137 138  K  --  2.6* 3.3* 3.8  CL  --  106 105 108  CO2  --  21* 19* 16*  GLUCOSE  --  85 239* 87  BUN  --  25* 22* 20  CREATININE  --  2.07* 1.43* 1.46*  CALCIUM  --  8.5* 8.1* 8.4*  PROT 6.3* 5.5*  --  5.2*  ALBUMIN 3.2* 2.8*  --  2.4*  AST 467* 380*  --  151*  ALT 486* 428*  --  272*  ALKPHOS 137* 126  --  176*  BILITOT 1.5* 1.5*  --  1.8*  GFRNONAA  --  23* 36* 35*  GFRAA  --  26* 41* 40*  ANIONGAP  --  12 13 14      Hematology Recent Labs  Lab 07/24/17 0935 07/24/17 1007 07/25/17 0552 07/26/17 0346  WBC 11.6*  --  9.2 9.4  RBC 4.94  --  4.78 4.21  HGB 12.3 13.6 11.9* 10.4*  HCT 40.2 40.0 39.0 34.0*  MCV 81.4  --  81.6 80.8  MCH 24.9*  --  24.9*  24.7*  MCHC 30.6  --  30.5 30.6  RDW 20.8*  --  20.8* 20.7*  PLT 237  --  186 173    Cardiac Enzymes Recent Labs  Lab 07/25/17 0552 07/25/17 1014 07/25/17 1623 07/25/17 2320  TROPONINI 0.06* 0.06* 0.03* 0.03*    Recent Labs  Lab 07/24/17 1006  TROPIPOC 0.00     BNP Recent Labs  Lab 07/24/17 1007  BNP 670.9*     DDimer No results for input(s): DDIMER in the last 168 hours.   Radiology    Ct Head Wo Contrast  Result Date: 07/24/2017 CLINICAL DATA:  Altered mental status.  Nausea and vomiting. EXAM: CT HEAD WITHOUT CONTRAST TECHNIQUE: Contiguous axial images were obtained from the base of the skull through the vertex without intravenous contrast. COMPARISON:  Brain MRI and head CT 06/14/2017 FINDINGS: Brain: Known recent infarcts in the right occipital lobe and temporoparietal region. No visible hemorrhagic conversion. Small remote right inferior cerebellar infarct. No evidence of acute infarct, hemorrhage, hydrocephalus, or mass. Vascular: Atherosclerotic calcification.  No hyperdense vessel. Skull: No acute or aggressive finding Sinuses/Orbits: Bilateral cataract  resection. IMPRESSION: 1. No acute finding. 2. Known recent right cerebral infarcts. Electronically Signed   By: Monte Fantasia M.D.   On: 07/24/2017 11:54   Dg Chest Port 1 View  Result Date: 07/24/2017 CLINICAL DATA:  Onset of shortness of breath and left-sided chest pain this morning. History of atrial fibrillation, diabetes, and hypertension. Nonsmoker. EXAM: PORTABLE CHEST 1 VIEW COMPARISON:  PA and lateral chest x-ray of July 11, 2016 FINDINGS: The lungs are adequately inflated. The interstitial markings are mildly increased. The cardiac silhouette is enlarged. The pulmonary vascularity is mildly prominent. There is no definite pleural effusion. The mediastinum is normal in width. The observed bony thorax exhibits no acute abnormality. There is an old fracture of the lateral aspect of the left third rib. IMPRESSION: Findings suggestive of low-grade CHF with mild interstitial edema. No alveolar pneumonia nor pneumothorax. Electronically Signed   By: David  Martinique M.D.   On: 07/24/2017 10:02    Cardiac Studies   2D echo Study Conclusions  - Left ventricle: The cavity size was normal. Systolic function was   normal. The estimated ejection fraction was in the range of 50%   to 55%. EF may be underestimated due to underlying atrial   fibrillation with intermittent RVR Wall motion was normal; there   were no regional wall motion abnormalities. The study was not   technically sufficient to allow evaluation of LV diastolic   dysfunction due to atrial fibrillation. - Aortic valve: Severe diffuse calcification involving the   noncoronary cusp. - Mitral valve: Calcified annulus. There was mild regurgitation. - Pulmonary arteries: PA peak pressure: 36 mm Hg (S).  Impressions:  - The right ventricular systolic pressure was increased consistent   with mild pulmonary hypertension.   Patient Profile     72 y.o. female who is been followed by Dr. Stanford Breed since January of this year. She  has a history of chronic atrial fibrillation, normal LV function by echo 06/16/2017 with EF 60-65%, moderate mitral stenosis, mild to moderate mitral regurgitation, moderate pulmonary hypertension with PA systolic pressure 49 mmHg. She also has a history of recent embolic CVA, chronic kidney disease stage III, chronic diastolic heart failure, hypertension, hyperlipidemia, type 2 diabetes mellitus, rheumatoid arthritis, falls, chronic iron deficiency anemia and GERD who presented to the emergency room after developing confusion over the past few days at [facility] she  was for rehab from her CVA. Her son is in the room with her currently and states that she has complained of chest pain intermittently for the past 24-48 hours that she states is over her left breast and is worse with palpation.  He was found to be in atrial fibrillation with RVR.  Assessment & Plan    1.Atrial fibrillation with RVR ([place]) -She has a history of chronic atrial fibrillation and suspect the elevated heart rate is due to acute underlying illness -heart rate now controlled on IV Cardizem gtt at 15mg /hr and BB PO -Continue metoprolol 100 mg twice daily. -D/C IV Cardizem and placed back on home dose of Cardizem CD 360 mg daily -change IV Heparin gtt back to Xarelto but decrease to 15mg  daily due to creatinine clearance of 40.65  2. Chronic diastolic CHF - Pt weight is the same as it was when she was last admittedbut son states po intake has been poor. -She does not appear volume overloaded on exam despite chest x-ray showing low-grade CHF and mildly elevated BNP - she got Lasix 20 mg IV in the ER creatinine bumped to 2  -Creatinine improved now to 1.46 after holding further diuretics -2D echocardiogram showed low normal LV function with EF 50-55% which may be underestimated given her underlying atrial fibrillation with intermittent RVR during the study  3.  Encephalopathy -unclear etiology -Suspect she has an  acute viral syndrome given her recent decreased p.o. intake, nausea and diarrhea and low-grade temp -She also has had persistently low blood sugars despite IV dextrose which may be contributing to her encephalopathy -UA shows cloudy urine but no bacteria and 6-30 white blood cells -Seen by psych for hallucinations and is now on Seroquel -Her encephalopathy appears resolved this morning as she is communicating and appropriate and more awake than I seen her since admission  4.  Elevated LFTs - ?  Etiology -LFTs trending downward -Hepatitis panel negative -Possible viral etiology given low-grade fevers diarrhea as well  5.  Persistent hypoglycemia unknown etiology -This appears to have resolved now blood sugars are back up -Per IM  6.  Elevated troponin with flat trend (0.07>0.09>0.09>0.06) -Likely related to demand ischemia in the setting of atrial fibrillation with RVR, acute kidney injury and acute illness -2D echo shows low normal LV function -No acute ST changes on EKG -No further ischemic work-up at this time  8.  Hypokalemia - repleted per IM  9.  Acute on CKD stage 3 -Creatinine now up to 2.07 likely related to diuretic use on admission -Diuretics are now on hold -Creatinine now back down to 1.46  New recommendations at this time.  Will sign off.  Please call with any questions. Please have patient follow-up outpatient with Dr. Stanford Breed  For questions or updates, please contact [facility] Please consult [email or web address] for contact info under Cardiology/STEMI.      Signed, Fransico Him, MD  07/26/2017, 9:36 AM

## 2017-07-27 ENCOUNTER — Other Ambulatory Visit: Payer: Self-pay | Admitting: Family Medicine

## 2017-07-27 LAB — CBC
HCT: 33.2 % — ABNORMAL LOW (ref 36.0–46.0)
Hemoglobin: 10.2 g/dL — ABNORMAL LOW (ref 12.0–15.0)
MCH: 24.8 pg — ABNORMAL LOW (ref 26.0–34.0)
MCHC: 30.7 g/dL (ref 30.0–36.0)
MCV: 80.6 fL (ref 78.0–100.0)
PLATELETS: 171 10*3/uL (ref 150–400)
RBC: 4.12 MIL/uL (ref 3.87–5.11)
RDW: 20.5 % — ABNORMAL HIGH (ref 11.5–15.5)
WBC: 8.5 10*3/uL (ref 4.0–10.5)

## 2017-07-27 LAB — COMPREHENSIVE METABOLIC PANEL
ALBUMIN: 2.7 g/dL — AB (ref 3.5–5.0)
ALT: 199 U/L — AB (ref 14–54)
AST: 63 U/L — AB (ref 15–41)
Alkaline Phosphatase: 174 U/L — ABNORMAL HIGH (ref 38–126)
Anion gap: 8 (ref 5–15)
BUN: 20 mg/dL (ref 6–20)
CHLORIDE: 108 mmol/L (ref 101–111)
CO2: 23 mmol/L (ref 22–32)
CREATININE: 1.41 mg/dL — AB (ref 0.44–1.00)
Calcium: 8.8 mg/dL — ABNORMAL LOW (ref 8.9–10.3)
GFR calc Af Amer: 42 mL/min — ABNORMAL LOW (ref >60)
GFR calc non Af Amer: 36 mL/min — ABNORMAL LOW (ref >60)
GLUCOSE: 105 mg/dL — AB (ref 65–99)
Potassium: 3.1 mmol/L — ABNORMAL LOW (ref 3.5–5.1)
Sodium: 139 mmol/L (ref 135–145)
Total Bilirubin: 1.1 mg/dL (ref 0.3–1.2)
Total Protein: 5.6 g/dL — ABNORMAL LOW (ref 6.5–8.1)

## 2017-07-27 LAB — GLUCOSE, CAPILLARY
GLUCOSE-CAPILLARY: 266 mg/dL — AB (ref 65–99)
Glucose-Capillary: 86 mg/dL (ref 65–99)

## 2017-07-27 MED ORDER — QUETIAPINE FUMARATE 25 MG PO TABS: 25.0000 mg | ORAL_TABLET | Freq: Three times a day (TID) | ORAL | 0 refills | Status: DC | PRN

## 2017-07-27 MED ORDER — POTASSIUM CHLORIDE CRYS ER 20 MEQ PO TBCR
40.0000 meq | EXTENDED_RELEASE_TABLET | Freq: Three times a day (TID) | ORAL | Status: DC
  Administered 2017-07-27 (×2): 40 meq via ORAL
  Filled 2017-07-27 (×2): qty 2

## 2017-07-27 MED ORDER — METOPROLOL TARTRATE 100 MG PO TABS: 100.0000 mg | ORAL_TABLET | Freq: Two times a day (BID) | ORAL | 0 refills | Status: DC

## 2017-07-27 MED ORDER — DILTIAZEM HCL ER COATED BEADS 360 MG PO CP24: 360.0000 mg | ORAL_CAPSULE | Freq: Every day | ORAL | 0 refills | Status: DC

## 2017-07-27 MED ORDER — RIVAROXABAN 15 MG PO TABS: 15.0000 mg | ORAL_TABLET | Freq: Every day | ORAL | 0 refills | Status: DC

## 2017-07-27 MED FILL — QUETIAPINE FUMARATE 25 MG T: 25 | 30 days supply | Qty: 30 | Fill #0

## 2017-07-27 MED FILL — XARELTO 15 MG TABLET: 15 | 30 days supply | Qty: 30 | Fill #0

## 2017-07-27 NOTE — Progress Notes (Signed)
Was discharged from the hospital today with instructions to resume her home medications.  Patient had adequate with prescriptions also given for Xarelto.  Patient's home medication list inquires several hours after discharge, patient sent call back saying that they did not have any of her home meds.  Upon review of her medication list, the most pertinent ones for her are her rate control medications including diltiazem and metoprolol for her atrial fibrillation.  Again patient does have a prescription that was picked up for her anticoagulation of Xarelto.  Will write for a 2-day prescription for patient to have these medications with further refills to be provided by patient's PCP.

## 2017-07-27 NOTE — Progress Notes (Signed)
    Durable Medical Equipment  (From admission, onward)        Start     Ordered   07/27/17 1235  For home use only DME Tub bench  Once     07/27/17 1234   07/27/17 1235  For home use only DME Other see comment  Once    Comments:  Hoyer lift   07/27/17 1234   07/27/17 0000  For home use only DME 3 n 1     07/27/17 1217   07/27/17 0000  DME Hospital bed    Question Answer Comment  The above medical condition requires: Patient requires the ability to reposition frequently   Bed type Semi-electric   Hoyer Lift Yes      07/27/17 1217

## 2017-07-27 NOTE — Progress Notes (Addendum)
Per CSW patients family wishes to take her home.  CM called and spoke with Fara Olden, her son, and he does wish to take the patient home until her long term care benefits start.  Fara Olden states they need the following DME: hospital bed, hoyer lift, 3 in 1, tub bench------they have a wheelchair for her at home. He is also interested in [facility] services. CM offered choice of [place] agency and DME and he selected [facility]. Jermaine with [facility] notified and accepted the referral and will order the DME for home.  Pt is discharging to: [facility] in [place], [place] with [facility] aware Joel's contact number: [phone] and his wife Neoma Laming: [phone]  CM will contact Fara Olden about transportation home.   Addendum: CM spoke to [employer] and he plans on providing transportation for his mom. Bedside RN updated.

## 2017-07-27 NOTE — Discharge Instructions (Signed)
[name], we changed some of your medications for when you go home.  We are starting Seroquel, which you will only take at night if you have confusion - you do not need this medication if you do not have confusion.  We stopped your medication for your rheumatoid arthritis, Arava, because it can injure your liver.  We are also stopping Amaryl because this medication can drop your blood sugar too low.  We are reducing your Xarelto to 15 mg per day because your kidneys have been injured recently.  Please follow up with your PCP in the next week or two for an office visit.

## 2017-07-27 NOTE — Progress Notes (Signed)
CSW spoke to patient's son, Fara Olden, via phone and discussed disposition plan. Son plans to take patient home at discharge. They are unable to afford the co-pays for SNF; patient has used all 100% covered Medicare days for SNF. Son and his fiance will be at home to support patient. Son had questions about DME for the home and is open to home health services. CSW referred to [facility] for home health needs. MD also aware. CSW signing off, as patient will discharge home and no additional social work needs at this time.  Estanislado Emms, [place]

## 2017-07-27 NOTE — Progress Notes (Signed)
Family Medicine Teaching Service Daily Progress Note Intern Pager: [phone]  Patient name: [name] Medical record number: [id number] Date of birth: [birth year] Age: 72 y.o. Gender: female  Primary Care Provider: Aletha Halim., PA-C Consultants: cardiology Code Status: partial (DNI)  Pt Overview and Major Events to Date:  Admitted 4/22  Assessment and Plan:  [name] is a 72 y.o. female presenting with atrial fibrillation with RVR. PMH is significant for atrial fibrillation, altered mental status, recent embolic stroke, CKD3, HFpEF, HTN, HLD, T2DM, GERD, RA, Anxiety/Depression, frequent falls.  Atrial fibrillation with RVR  Atrial fibrillation with rate at 161 on admission, now controlled on home medications of Cardizem 360 mg and Lopressor 100 mg BID.  Cardiology following. - continue home Cardizem 360 mg daily - continue PO Lopressor 100 mg BID - Xarelto 15 mg  Altered Mental Status Likely a mixture of vascular dementia with hypoglycemia, although other factors could be involved.  SNF nurse reported that she has been altered and has been hallucinating since being at their facility.  Psychiatry assessed patient on 4/23 and started Seroquel QHS for delirium and hallucinations which are disturbing to the patient; patient has not required seroquel, and hallucinations have not been disturbing.  Patient is lucid and completely alert and oriented on 4/24, saying that she does not have memory of her admission.   - hold gabapentin due to AKI - continue Wellbutrin 150 mg BID  Hypoglycemia Patient's blood sugar was consistently low on night of admission, ranging from 47 to 120 but mostly in the 50-80 range.  Likely due to Amaryl dose on 4/23 combined with poor PO intake and reduced clearance due to AKI.  Blood sugars have been increased since patient has been eating - continue CBG monitoring TID AC and QHS with sensitive SSI  Elevated Liver enzymes - uncertain etiology.  Patient endorses vomiting x1. May be due to intravascular depletion vs hepatic congestion from CHF.  LFTs are trending down with AST [phone], ALT [phone].  - follow daily CMP - stop Arava due to hepatic injury  HFpEF Echo performed on 4/22 significant for EF 50-55%, although could be falsely decreased due to atrial fibrillation.  RV systolic pressure was increased, consistent with mild pulmonary hypertension.  No crackles heard on lung exam.  CXR notable for mild interstitial edema with enlarged cardiac silhouette.  BNP elevated from baseline at 670.9 (baseline appears to be 200-250).  Given Lasix 20 mg IV on 4/22. - strict I's and O's - daily weights  AKI Patient with creatinine of 1.62>1.41 on 4/25, baseline appears to be about 0.70-0.80.  - Xarelto 15 mg daily due to reduced creatinine clearance   HTN Chronic. Blood pressures have ranged from 98/71 - 168/91 since admission.  Home medications include metoprolol tartrate 100mg  BID and diltiazem 360mg  daily - continue Lopressor 100 mg BID - Diltiazem 360 mg PO  Type II DM Last A1c from March 2019 was 6.9.  Patient takes Amaryl at home.  CBGs controlled on sensitive SSI. - Hold home Amaryl - Monitor CBG, sensitive SSI  HLD Lipid panel from March 2019 with total cholesterol 143, HDL 31, LDL 66, TG 229 - hold home Crestor in setting of elevated LFTs  Rheumatoid arthritis Chronic. Stable.  Patient takes Keith Rake and prednisone at home - will stop home [place] since this medication can cause liver injury - prednisone 5 mg daily - Continue Orencia injection q Thursdayif patient still in hospital  Anxiety/Depression Takes Wellbutrin 150 mg BID  at home.  Unknown if patient has exhibited post-stroke depression. - continue home Wellbutrin  Chronic anemia Takes Fe supplement at home.  Hgb low-normal at 10.2 on 4/25 - continue to monitor - continue home Fe  Frequent falls Patient has sustained multiple  falls in the past few months, with her last fall about one week ago resulting in a broken R second toe.  Patient uses walker for ambulation.  CT head negative for bleed. - up only with assistance  Hypokalemia Potassium 3.1 on 4/25 - replete with Kdur 40 mEq x 3 on 4/25  FEN/GI: dysphagia 3 diet Prophylaxis: xarelto  Disposition: possible discharge 4/25  Subjective:  Patient says she feels frustrated because she can't get the phone to work.  She denies chest pain or shortness of breath.  She would like to go home today and knows that she will likely go home with her son.  Objective: Temp:  [97.7 F (36.5 C)-98 F (36.7 C)] 97.7 F (36.5 C) (04/25 0525) Pulse Rate:  [41-96] 91 (04/25 0525) Resp:  [15-21] 20 (04/25 0525) BP: (103-127)/(51-86) 117/53 (04/25 0525) SpO2:  [91 %-97 %] 91 % (04/25 0525) Weight:  [175 lb (79.4 kg)] 175 lb (79.4 kg) (04/25 0345) Physical Exam: General: sitting on side of bed, AAOX3 Cardiovascular: irregular rhythm, normal rate Respiratory: CTAB Abdomen: nontender, nondistended Extremities: 1+ pedal edema, stable from previous days  Laboratory: Recent Labs  Lab 07/25/17 0552 07/26/17 0346 07/27/17 0308  WBC 9.2 9.4 8.5  HGB 11.9* 10.4* 10.2*  HCT 39.0 34.0* 33.2*  PLT 186 173 171   Recent Labs  Lab 07/25/17 0552 07/25/17 1623 07/26/17 0346 07/27/17 0308  NA 139 137 138 139  K 2.6* 3.3* 3.8 3.1*  CL 106 105 108 108  CO2 21* 19* 16* 23  BUN 25* 22* 20 20  CREATININE 2.07* 1.43* 1.46* 1.41*  CALCIUM 8.5* 8.1* 8.4* 8.8*  PROT 5.5*  --  5.2* 5.6*  BILITOT 1.5*  --  1.8* 1.1  ALKPHOS 126  --  176* 174*  ALT 428*  --  272* 199*  AST 380*  --  151* 63*  GLUCOSE 85 239* 87 105*    Imaging/Diagnostic Tests: Ct Head Wo Contrast  Result Date: 07/24/2017 CLINICAL DATA:  Altered mental status.  Nausea and vomiting. EXAM: CT HEAD WITHOUT CONTRAST TECHNIQUE: Contiguous axial images were obtained from the base of the skull through the vertex  without intravenous contrast. COMPARISON:  Brain MRI and head CT 06/14/2017 FINDINGS: Brain: Known recent infarcts in the right occipital lobe and temporoparietal region. No visible hemorrhagic conversion. Small remote right inferior cerebellar infarct. No evidence of acute infarct, hemorrhage, hydrocephalus, or mass. Vascular: Atherosclerotic calcification.  No hyperdense vessel. Skull: No acute or aggressive finding Sinuses/Orbits: Bilateral cataract resection. IMPRESSION: 1. No acute finding. 2. Known recent right cerebral infarcts. Electronically Signed   By: Monte Fantasia M.D.   On: 07/24/2017 11:54   Dg Chest Port 1 View  Result Date: 07/24/2017 CLINICAL DATA:  Onset of shortness of breath and left-sided chest pain this morning. History of atrial fibrillation, diabetes, and hypertension. Nonsmoker. EXAM: PORTABLE CHEST 1 VIEW COMPARISON:  PA and lateral chest x-ray of July 11, 2016 FINDINGS: The lungs are adequately inflated. The interstitial markings are mildly increased. The cardiac silhouette is enlarged. The pulmonary vascularity is mildly prominent. There is no definite pleural effusion. The mediastinum is normal in width. The observed bony thorax exhibits no acute abnormality. There is an old fracture of the  lateral aspect of the left third rib. IMPRESSION: Findings suggestive of low-grade CHF with mild interstitial edema. No alveolar pneumonia nor pneumothorax. Electronically Signed   By: David  Martinique M.D.   On: 07/24/2017 10:02     Kathrene Alu, MD 07/27/2017, 7:45 AM PGY-1, [facility] Intern pager: [phone], text pages welcome

## 2017-07-28 NOTE — Discharge Summary (Signed)
[facility] Discharge Summary  Patient name: [name] record number: [id number] Date of birth: [birth year] Age: 72 y.o. Gender: female Date of Admission: 07/24/2017  Date of Discharge: 07/27/17 Admitting Physician: Dickie La, MD  Primary Care Provider: Aletha Halim., PA-C Consultants: Cardiology  Indication for Hospitalization: atrial fibrillation with RVR  Discharge Diagnoses/Problem List:  Atrial Fibrillation with RVR Altered Mental Status Elevated Liver Enzymes HFpEF HTN Type 2 DM HLD Rheumatoid Arthritis Anxiety/Depression Chronic Anemia Frequent Falls Hypokalemia  Disposition: home with son  Discharge Condition: stable, improved  Discharge Exam:  General: sitting on side of bed, AAOX3 Cardiovascular: irregular rhythm, normal rate Respiratory: CTAB Abdomen: nontender, nondistended Extremities: 1+ pedal edema, stable from previous days  Brief Hospital Course:  [name] was admitted on 07/24/17 for atrial fibrillation with RVR.  She was also found to have altered mental status with hallucinations and elevated liver enzymes on presentation.  For her atrial fibrillation with RVR, [name] was placed on a diltiazem drip and IV Lopressor (patient could not take PO due to her confusion at admission) with resulting decrease in heart rate from 161 at admission to <100 by 4/23 and throughout the remainder of admission.  She was also transitioned from her home Xarelto to Heparin gtt due to inability to take PO.  Due to her sustained improvement on the Cardizem drip and clearance from SLP to take PO, she was transitioned to her home Diltiazem 360 mg daily on 07/26/17 and to all other PO medications soon afterward.  Her Xarelto was decreased from 20 mg to 15 mg daily due to her AKI (creatinine was 1.4-1.6 during admission, baseline 0.7-0.8).  She was discharged on her home diltiazem, lopressor, and xarelto 15 mg.  Regarding [name]'s  AMS, she has a history of CVA in March with reports that she has had waxing and waning confusion since then.  On admission, she had several visual hallucinations and was lethargic.  She also had hypoglycemia during the first night of admission, with CBGs in the 34s and 50s even with multiple rounds of D50 given.  This hypoglycemia was thought to be due to her Amaryl in conjunction with her AKI and poor PO intake.  She was placed on a D51/2NS infusion and was cleared for PO intake from SLP.  Once she began eating, her hypoglycemia resolved, and her mental status improved as well.  [name] was noted to have elevated LFTs on admission, with AST 467 and ALT 486.  Bilirubin was elevated to 1.5 and alkaline phosphatase was 137 as well.  Patient did not report abdominal pain during admission.  One of her RA drugs, Arava, was noted to have liver injury as a side effect, so this medication was stopped and discontinued at discharge as well.  Her LFTs trended down throughout admission and were AST 199 and ALT 174 on the day of discharge.  Issues for Follow Up:  1. Patient's mental status improved during admission, but she likely has some vascular dementia after her stroke.  She would benefit from further testing to elucidate the degree of cognitive decline she has. 2. Please continue to hold Arava due to concern for liver injury in this patient. 3. Amaryl was discontinued at discharge due to this patient's age, hypoglycemia on presentation, and A1c under goal of 8.0.  Significant Procedures: none  Significant Labs and Imaging:  Recent Labs  Lab 07/25/17 0552 07/26/17 0346 07/27/17 0308  WBC 9.2 9.4 8.5  HGB 11.9* 10.4* 10.2*  HCT 39.0 34.0* 33.2*  PLT 186 173 171   Recent Labs  Lab 07/24/17 0935 07/24/17 1007 07/24/17 1852 07/25/17 0552 07/25/17 1623 07/26/17 0346 07/27/17 0308  NA 135 139  --  139 137 138 139  K 4.7 4.6  --  2.6* 3.3* 3.8 3.1*  CL 103 106  --  106 105 108 108  CO2 14*  --    --  21* 19* 16* 23  GLUCOSE 64* 59*  --  85 239* 87 105*  BUN 32* 39*  --  25* 22* 20 20  CREATININE 1.62* 1.60*  --  2.07* 1.43* 1.46* 1.41*  CALCIUM 8.6*  --   --  8.5* 8.1* 8.4* 8.8*  ALKPHOS 132*  --  137* 126  --  176* 174*  AST 502*  --  467* 380*  --  151* 63*  ALT 482*  --  486* 428*  --  272* 199*  ALBUMIN 3.1*  --  3.2* 2.8*  --  2.4* 2.7*      Results/Tests Pending at Time of Discharge: none  Discharge Medications:  Allergies as of 07/27/2017   No Known Allergies     Medication List    STOP taking these medications   glimepiride 2 MG tablet Commonly known as:  AMARYL   leflunomide 20 MG tablet Commonly known as:  ARAVA     TAKE these medications   acetaminophen 325 MG tablet Commonly known as:  TYLENOL Take 1-2 tablets (325-650 mg total) by mouth every 4 (four) hours as needed for mild pain.   buPROPion 150 MG 12 hr tablet Commonly known as:  WELLBUTRIN SR Take 150 mg by mouth 2 (two) times daily.   cyanocobalamin 1000 MCG tablet Take 1 tablet (1,000 mcg total) by mouth every evening.   esomeprazole 40 MG capsule Commonly known as:  NEXIUM Take 40 mg by mouth every morning.   ferrous gluconate 324 MG tablet Commonly known as:  FERGON Take 1 tablet (324 mg total) by mouth 2 (two) times daily with a meal.   gabapentin 100 MG capsule Commonly known as:  NEURONTIN Take 1 capsule (100 mg total) by mouth at bedtime.   meclizine 12.5 MG tablet Commonly known as:  ANTIVERT Take 12.5 mg by mouth every 4 (four) hours as needed for dizziness (Nausea).   nitroGLYCERIN 0.4 MG SL tablet Commonly known as:  NITROSTAT Place 1 tablet (0.4 mg total) under the tongue every 5 (five) minutes as needed for chest pain (x 3 doses).   ORENCIA CLICKJECT 408 MG/ML Soaj Generic drug:  Abatacept Inject 125 mg into the skin every Thursday.   potassium chloride SA 20 MEQ tablet Commonly known as:  K-DUR,KLOR-CON Take 1 tablet (20 mEq total) by mouth daily.    predniSONE 5 MG tablet Commonly known as:  DELTASONE Take 5 mg by mouth daily.   PRESERVISION AREDS 2 Caps Take 1 capsule by mouth 2 (two) times daily.   QUEtiapine 25 MG tablet Commonly known as:  SEROQUEL Take 1 tablet (25 mg total) by mouth 3 (three) times daily as needed (delerium or aggitation).   Rivaroxaban 15 MG Tabs tablet Commonly known as:  XARELTO Take 1 tablet (15 mg total) by mouth daily. What changed:    medication strength  how much to take  when to take this   rosuvastatin 40 MG tablet Commonly known as:  CRESTOR Take 1 tablet (40 mg total) by mouth every evening.  senna-docusate 8.6-50 MG tablet Commonly known as:  Senokot-S Take 1 tablet by mouth at bedtime as needed for mild constipation.   Vitamin D3 1000 units Caps Take 1,000 Units by mouth every evening.   VOLTAREN 1 % Gel Generic drug:  diclofenac sodium Apply 2 g topically at bedtime as needed for pain. Knees, calf            Durable Medical Equipment  (From admission, onward)        Start     Ordered   07/27/17 0000  For home use only DME 3 n 1     07/27/17 1217   07/27/17 0000  DME Hospital bed    Question Answer Comment  The above medical condition requires: Patient requires the ability to reposition frequently   Bed type Semi-electric   Hoyer Lift Yes      07/27/17 1217      Discharge Instructions: Please refer to Patient Instructions section of EMR for full details.  Patient was counseled important signs and symptoms that should prompt return to medical care, changes in medications, dietary instructions, activity restrictions, and follow up appointments.   Follow-Up Appointments: Follow-up Information    Aletha Halim., PA-C. Schedule an appointment as soon as possible for a visit in 1 week(s).   Specialty:  Family Medicine Contact information: [address] [place] [address] [phone]           Kathrene Alu, MD 07/28/2017, 9:17  AM PGY-1, [place]

## 2017-08-01 ENCOUNTER — Other Ambulatory Visit: Payer: Self-pay | Admitting: Family Medicine

## 2017-08-01 DIAGNOSIS — I4891 Unspecified atrial fibrillation: Secondary | ICD-10-CM | POA: Diagnosis not present

## 2017-08-01 DIAGNOSIS — E041 Nontoxic single thyroid nodule: Secondary | ICD-10-CM | POA: Diagnosis not present

## 2017-08-01 DIAGNOSIS — R945 Abnormal results of liver function studies: Secondary | ICD-10-CM | POA: Diagnosis not present

## 2017-08-01 DIAGNOSIS — E119 Type 2 diabetes mellitus without complications: Secondary | ICD-10-CM | POA: Diagnosis not present

## 2017-08-01 DIAGNOSIS — R4182 Altered mental status, unspecified: Secondary | ICD-10-CM | POA: Diagnosis not present

## 2017-08-01 DIAGNOSIS — Z09 Encounter for follow-up examination after completed treatment for conditions other than malignant neoplasm: Secondary | ICD-10-CM | POA: Diagnosis not present

## 2017-08-03 ENCOUNTER — Ambulatory Visit (INDEPENDENT_AMBULATORY_CARE_PROVIDER_SITE_OTHER): Payer: Medicare Other | Admitting: Adult Health

## 2017-08-03 ENCOUNTER — Telehealth: Payer: Self-pay | Admitting: Adult Health

## 2017-08-03 ENCOUNTER — Ambulatory Visit: Payer: Medicare Other | Admitting: Adult Health

## 2017-08-03 ENCOUNTER — Encounter: Payer: Self-pay | Admitting: Adult Health

## 2017-08-03 VITALS — BP 140/84 | HR 72 | Ht 66.0 in

## 2017-08-03 DIAGNOSIS — I63431 Cerebral infarction due to embolism of right posterior cerebral artery: Secondary | ICD-10-CM | POA: Diagnosis not present

## 2017-08-03 DIAGNOSIS — E785 Hyperlipidemia, unspecified: Secondary | ICD-10-CM

## 2017-08-03 DIAGNOSIS — I480 Paroxysmal atrial fibrillation: Secondary | ICD-10-CM

## 2017-08-03 DIAGNOSIS — I639 Cerebral infarction, unspecified: Secondary | ICD-10-CM | POA: Diagnosis not present

## 2017-08-03 DIAGNOSIS — I63411 Cerebral infarction due to embolism of right middle cerebral artery: Secondary | ICD-10-CM | POA: Diagnosis not present

## 2017-08-03 DIAGNOSIS — I10 Essential (primary) hypertension: Secondary | ICD-10-CM

## 2017-08-03 MED ORDER — ROSUVASTATIN CALCIUM 20 MG PO TABS: 20.0000 mg | ORAL_TABLET | Freq: Every evening | ORAL | 3 refills | Status: DC

## 2017-08-03 NOTE — Progress Notes (Deleted)
[facility] [address] [place]. [place] [address] [phone]       OFFICE FOLLOW UP NOTE  [name] Date of Birth:  January 07, 1946 Medical Record Number:  [id number]   Reason for Referral:  hospital stroke follow up  CHIEF COMPLAINT:  No chief complaint on file.   HPI: [name] is being seen today for initial visit in the office for right MCA and right PCA with hemorraghic transformation on 06/14/17. History obtained from *** and chart review. Reviewed all radiology images and labs personally.  [name] is a 72 year old female with history of CKD, A. fib on Xarelto, CHF, HLD, HTN, PVD, diabetes, rheumatoid arthritis and history of stroke admitted for headache and decreased vision.  Exam showed left hemianopia.  She was on Xarelto until last Monday due to incoming procedures.  CT showed right PCA subacute stroke.  MRI showed right PCA subacute stroke with petechiae hemorrhage as well as right MCA inferior territory acute infarct.  CTA head and neck unremarkable.  2D echo showed EF of 50-55%. LDL 66 and A1c 6.9. Patients stroke most likely due to A. fib not on anticoagulation due to incoming procedure.  Currently not able to restart [facility] due to large size of the stroke.  Continue aspirin 325 and resume Xarelto in 7 days for stroke prevention.  Aspirin can be discontinued at that time.  Crestor increased from 10mg  to 40mg .  PT/OT evaluation recommend home PT/OT. Patient discharged home in stable condition.  Since dsicharge, ***    ROS:   14 system review of systems performed and negative with exception of ***  PMH:  Past Medical History:  Diagnosis Date  . Abnormal LFTs 07/24/2017  . Actinic keratosis   . Anemia    hx of  . Anemia of chronic renal failure, stage 3 (moderate) ([place]) 07/17/2015  . Anticoagulant causing adverse effect in therapeutic use 07/17/2015  . Anxiety   . Arthritis    rheumatoid  . Atrial fibrillation ([place])    a. persistent,  she remains on Xarelto  . Carpal tunnel syndrome   . Chronic diastolic (congestive) heart failure ([place])    a. 04/2016: echo showing a preserved EF of 55-60% with mild MR. LA and RA midly dilated.   . Edema    left leg at ankle resolved now  . Gastroesophageal reflux disease   . Hepatitis 1970   not sure what kind  . Hiatal hernia   . Hyperlipidemia   . Hypertension   . Iron deficiency anemia due to chronic blood loss 07/17/2015  . Jaundice    age 66  . Peripheral vascular disease ([place]) yrs ago   DVT left lower leg questionale told by 2 drs i had no clot, 1 md said i did  . Rheumatoid arthritis(714.0)   . Right knee pain   . Stroke ([place])   . Type II diabetes mellitus ([place])    type2    PSH:  Past Surgical History:  Procedure Laterality Date  . CATARACT EXTRACTION Bilateral   . COLONOSCOPY WITH PROPOFOL N/A 02/20/2015   Procedure: COLONOSCOPY WITH PROPOFOL;  Surgeon: Carol Ada, MD;  Location: [facility];  Service: Endoscopy;  Laterality: N/A;  . ENTEROSCOPY N/A 03/23/2016   Procedure: ENTEROSCOPY;  Surgeon: Carol Ada, MD;  Location: [facility];  Service: Endoscopy;  Laterality: N/A;  . ENTEROSCOPY N/A 06/17/2016   Procedure: ENTEROSCOPY;  Surgeon: Carol Ada, MD;  Location: [facility];  Service: Endoscopy;  Laterality: N/A;  .  ESOPHAGOGASTRODUODENOSCOPY (EGD) WITH PROPOFOL N/A 02/20/2015   Procedure: ESOPHAGOGASTRODUODENOSCOPY (EGD) WITH PROPOFOL;  Surgeon: Carol Ada, MD;  Location: [facility];  Service: Endoscopy;  Laterality: N/A;  . GIVENS CAPSULE STUDY N/A 03/21/2016   Procedure: GIVENS CAPSULE STUDY;  Surgeon: Carol Ada, MD;  Location: [facility];  Service: Endoscopy;  Laterality: N/A;  . HAND SURGERY  1995 and 1996   artificial joints both hands  . JOINT REPLACEMENT    . LUMBAR LAMINECTOMY/DECOMPRESSION MICRODISCECTOMY N/A 02/10/2016   Procedure: MICROLUMBAR DECOMPRESSION L4-L5 AND L3- L4, AND EXCISION OF SYNOVIAL CYST L4-L5;  Surgeon: Susa Day,  MD;  Location: [facility];  Service: Orthopedics;  Laterality: N/A;  . TOTAL HIP ARTHROPLASTY Left 2009  . TOTAL HIP ARTHROPLASTY Right 04/03/2013   Procedure: RIGHT TOTAL HIP ARTHROPLASTY ANTERIOR APPROACH;  Surgeon: Gearlean Alf, MD;  Location: [facility];  Service: Orthopedics;  Laterality: Right;    Social History:  Social History   Socioeconomic History  . Marital status: Divorced    Spouse name: Not on file  . Number of children: Not on file  . Years of education: Not on file  . Highest education level: Not on file  Occupational History  . Occupation: Retired  Scientific laboratory technician  . Financial resource strain: Not on file  . Food insecurity:    Worry: Not on file    Inability: Not on file  . Transportation needs:    Medical: Not on file    Non-medical: Not on file  Tobacco Use  . Smoking status: Never Smoker  . Smokeless tobacco: Never Used  Substance and Sexual Activity  . Alcohol use: No    Alcohol/week: 0.0 oz  . Drug use: No  . Sexual activity: Not Currently  Lifestyle  . Physical activity:    Days per week: Not on file    Minutes per session: Not on file  . Stress: Not on file  Relationships  . Social connections:    Talks on phone: Not on file    Gets together: Not on file    Attends religious service: Not on file    Active member of club or organization: Not on file    Attends meetings of clubs or organizations: Not on file    Relationship status: Not on file  . Intimate partner violence:    Fear of current or ex partner: Not on file    Emotionally abused: Not on file    Physically abused: Not on file    Forced sexual activity: Not on file  Other Topics Concern  . Not on file  Social History Narrative  . Not on file    Family History:  Family History  Problem Relation Age of Onset  . Pneumonia Mother 23  . Stroke Father 7  . Heart failure Sister   . Hypertension Sister   . Heart attack Neg Hx     Medications:   Current Outpatient Medications on  File Prior to Visit  Medication Sig Dispense Refill  . Abatacept (ORENCIA CLICKJECT) 935 MG/ML SOAJ Inject 125 mg into the skin every Thursday.     [name] acetaminophen (TYLENOL) 325 MG tablet Take 1-2 tablets (325-650 mg total) by mouth every 4 (four) hours as needed for mild pain.    [name] buPROPion (WELLBUTRIN SR) 150 MG 12 hr tablet Take 150 mg by mouth 2 (two) times daily.      . Cholecalciferol (VITAMIN D3) 1000 units CAPS Take 1,000 Units by mouth every evening.     [name]  diltiazem (CARDIZEM CD) 360 MG 24 hr capsule Take 1 capsule (360 mg total) by mouth daily for 2 days. 2 capsule 0  . esomeprazole (NEXIUM) 40 MG capsule Take 40 mg by mouth every morning.     . ferrous gluconate (FERGON) 324 MG tablet Take 1 tablet (324 mg total) by mouth 2 (two) times daily with a meal. 60 tablet 3  . gabapentin (NEURONTIN) 100 MG capsule Take 1 capsule (100 mg total) by mouth at bedtime.    . meclizine (ANTIVERT) 12.5 MG tablet Take 12.5 mg by mouth every 4 (four) hours as needed for dizziness (Nausea).    . metoprolol tartrate (LOPRESSOR) 100 MG tablet Take 1 tablet (100 mg total) by mouth 2 (two) times daily for 2 days. 4 tablet 0  . Multiple Vitamins-Minerals (PRESERVISION AREDS 2) CAPS Take 1 capsule by mouth 2 (two) times daily.    . nitroGLYCERIN (NITROSTAT) 0.4 MG SL tablet Place 1 tablet (0.4 mg total) under the tongue every 5 (five) minutes as needed for chest pain (x 3 doses). 25 tablet 2  . potassium chloride SA (K-DUR,KLOR-CON) 20 MEQ tablet Take 1 tablet (20 mEq total) by mouth daily.    . predniSONE (DELTASONE) 5 MG tablet Take 5 mg by mouth daily.    . QUEtiapine (SEROQUEL) 25 MG tablet Take 1 tablet (25 mg total) by mouth 3 (three) times daily as needed (delerium or aggitation). 30 tablet 0  . Rivaroxaban (XARELTO) 15 MG TABS tablet Take 1 tablet (15 mg total) by mouth daily. 30 tablet 0  . rosuvastatin (CRESTOR) 40 MG tablet Take 1 tablet (40 mg total) by mouth every evening.    . senna-docusate  (SENOKOT-S) 8.6-50 MG tablet Take 1 tablet by mouth at bedtime as needed for mild constipation.    . vitamin B-12 1000 MCG tablet Take 1 tablet (1,000 mcg total) by mouth every evening.    [name] VOLTAREN 1 % GEL Apply 2 g topically at bedtime as needed for pain. Knees, calf  2   No current facility-administered medications on file prior to visit.     Allergies:  No Known Allergies   Physical Exam  There were no vitals filed for this visit. There is no height or weight on file to calculate BMI. No exam data present  General: well developed, well nourished, seated, in no evident distress Head: head normocephalic and atraumatic.   Neck: supple with no carotid or supraclavicular bruits Cardiovascular: regular rate and rhythm, no murmurs Musculoskeletal: no deformity Skin:  no rash/petichiae Vascular:  Normal pulses all extremities  Neurologic Exam Mental Status: Awake and fully alert. Oriented to place and time. Recent and remote memory intact. Attention span, concentration and fund of knowledge appropriate. Mood and affect appropriate.  No flowsheet data found. Cranial Nerves: Fundoscopic exam reveals sharp disc margins. Pupils equal, briskly reactive to light. Extraocular movements full without nystagmus. Visual fields full to confrontation. Hearing intact. Facial sensation intact. Face, tongue, palate moves normally and symmetrically.  Motor: Normal bulk and tone. Normal strength in all tested extremity muscles. Sensory.: intact to touch , pinprick , position and vibratory sensation.  Coordination: Rapid alternating movements normal in all extremities. Finger-to-nose and heel-to-shin performed accurately bilaterally. Gait and Station: Arises from chair without difficulty. Stance is normal. Gait demonstrates normal stride length and balance . Able to heel, toe and tandem walk without difficulty.  Reflexes: 1+ and symmetric. Toes downgoing.    NIHSS  *** Modified Rankin  *** HAS-BLED  2  CHA2DS2-VASc 6   Diagnostic Data (Labs, Imaging, Testing)  CT HEAD WO CONTRAST 06/14/17 IMPRESSION: 1. Acute right posterior cerebral artery infarct.  No hemorrhage. 2. Atrophy and chronic microvascular white matter ischemic changes. 3. Remote right cerebellar infarct.  CT ANGIO HEAD W OR WO CONTRAST CT ANGIO NECK W OR WO CONTRAST 06/14/17 IMPRESSION: 1. No emergent large vessel occlusion or flow-limiting stenosis. 2. Redemonstration of right PCA territory infarct with expected evolution, but no hemorrhage or mass effect. The right posterior cerebral artery is angiographically patent. 3. Mild atherosclerotic calcification at the aortic arch, carotid bifurcations and skull base segments of the internal carotid arteries without hemodynamically significant stenosis by NASCET criteria. ( Aortic Atherosclerosis (ICD10-I70.0). ) 4. **An incidental finding of potential clinical significance has been found. Multiple thyroid nodules, measuring up to 3 cm. Dedicated thyroid ultrasound is recommended for further characterization on a nonemergent outpatient basis. **  MR BRAIN WO CONTRAST 06/15/17 IMPRESSION: 1. Acute infarcts of the posterior right middle cerebral artery territory and the right posterior cerebral artery territory. 2. Petechial hemorrhage in the distribution of the right PCA infarct. No intraparenchymal hematoma. No mass effect or hydrocephalus. 3. Chronic small vessel disease.  ECHO COMPLETE 06/16/17 Study Conclusions - Left ventricle: The cavity size was normal. There was moderate   concentric hypertrophy. Systolic function was normal. The   estimated ejection fraction was in the range of 60% to 65%. Wall   motion was normal; there were no regional wall motion   abnormalities. - Mitral valve: Calcified annulus. Moderately thickened, moderately   calcified leaflets . The findings are consistent with moderate   stenosis. There was mild to moderate regurgitation.  Valve area by   pressure half-time: 2.22 cm^2. Valve area by continuity equation   (using LVOT flow): 1.53 cm^2. - Right ventricle: The cavity size was mildly dilated. Wall   thickness was normal. Systolic function was mildly reduced. - Pulmonary arteries: Systolic pressure was moderately increased.   PA peak pressure: 49 mm Hg (S). - Pericardium, extracardiac: There was no pericardial effusion.    ASSESSMENT: [name] is a 72 y.o. year old female here with right MCA and right PCA on 06/14/17 secondary to atrial fibrillation off of AC for procedure. Vascular risk factors include HTN, HLD and atrial fibrillation.    PLAN: -Continue Xarelto (rivaroxaban) daily  and crestor  for secondary stroke prevention -F/u with PCP regarding your HLD and HTN management - PCP to prescribe -F/u with cardiologist for atrial fibrillation and Xarelto management  -continue to monitor BP at home  -Maintain strict control of hypertension with blood pressure goal below 130/90, diabetes with hemoglobin A1c goal below 6.5% and cholesterol with LDL cholesterol (bad cholesterol) goal below 70 mg/dL. I also advised the patient to eat a healthy diet with plenty of whole grains, cereals, fruits and vegetables, exercise regularly and maintain ideal body weight.  Follow up in *** or call earlier if needed   Greater than 50% time during this *** minute consultation visit was spent on counseling and coordination of care about ***, and ***, discussion about risk benefit of anticoagulation and answering questions.    Venancio Poisson, AGNP-BC  [facility] [address] [place], [place] [address]  Phone [phone] Fax [phone]

## 2017-08-03 NOTE — Telephone Encounter (Signed)
Sent to [facility] spoke to Judson Roch in admissions telephone [phone] - fax [phone]

## 2017-08-03 NOTE — Progress Notes (Signed)
[facility] [address] [place]. [place] [address] [phone]       OFFICE FOLLOW UP NOTE  Christina. [name] Date of Birth:  May 11, 1945 Medical Record Number:  [id number]    Reason for Referral:  hospital stroke follow up   CHIEF COMPLAINT:  Chief Complaint  Patient presents with  . Follow-up    Stroke follow up with son and daugher in law    HPI: [name] is being seen today for initial visit in the office for right MCA and right PCA on 06/14/17. History obtained from patient, daughter, son and chart review. Reviewed all radiology images and labs personally.  [name] is a 72 year old female with history of CKD, A. fib on Xarelto, CHF, HLD, HTN, PVD, diabetes, rheumatoid arthritis and history of stroke admitted for headache and decreased vision.  Exam showed left hemianopia.  She was on Xarelto until last Monday due to upcoming procedures.  CT head reviewed and showed right PCA subacute stroke.  MRI brain reviewed and showed right PCA subacute stroke with petechiae hemorrhage as well as right MCA inferior territory acute infarct.  CTA head and neck unremarkable.  2D echo EF 60-65%.  LDL 66 and A1c 6.9. Patient stroke most likely due to A. fib not on anti-coagulation due to upcoming procedure.  Currently not able to restart [facility] due to large size of the stroke.  Continue aspirin 325.  Resume Xarelto in 7 days for stroke prevention.  Aspirin can be discontinued at that time.  Continue Crestor.  PT/OT evaluation Recommend home PT/OT.  Since discharge, patient continues to participate in home PT. Her vision has been improving and feels as though most of it has resolved but does wax/wane at times especially with increased fatigue. Continues to take Xarelto without side effects of bleeding or bruising. Continues to take crestor but since increase, has increased complaints of muscle aches and pain. BP stable at 140/84 and this is checked during PT sessions. Family  concerned about worsening memory since stroke. AFT 8, recall 2/3 and clock drawing 3/4. Currently sitting in a w/c which patient did not have to use prior to stroke. Patient has old right leg injury with decreased muscle tone. She states this worsened with recent stroke but is working with PT and doing HEP daily. Patient currently living with family but able to do most ADLs with assistance in IADLs. Recently accepted to [facility] and will be transporting her there today or tomorrow. Does have complaints of frequent snoring, restless leg and daytime fatigue.  Has not been assessed for sleep apnea in the past.  Denies new or worsening stroke/TIA symptoms.   ROS:   14 system review of systems performed and negative with exception of fatigue, hearing loss. Loss of vision, leg swelling, restless leg, snoring. Sleep talking, incontinence of bladder, aching muscles, walking difficulty, wounds, numbness, and confusion  PMH:  Past Medical History:  Diagnosis Date  . Abnormal LFTs 07/24/2017  . Actinic keratosis   . Anemia    hx of  . Anemia of chronic renal failure, stage 3 (moderate) ([place]) 07/17/2015  . Anticoagulant causing adverse effect in therapeutic use 07/17/2015  . Anxiety   . Arthritis    rheumatoid  . Atrial fibrillation ([place])    a. persistent, she remains on Xarelto  . Carpal tunnel syndrome   . Chronic diastolic (congestive) heart failure ([place])    a. 04/2016: echo showing a preserved EF of 55-60% with mild  MR. LA and RA midly dilated.   . Edema    left leg at ankle resolved now  . Gastroesophageal reflux disease   . Hepatitis 1970   not sure what kind  . Hiatal hernia   . Hyperlipidemia   . Hypertension   . Iron deficiency anemia due to chronic blood loss 07/17/2015  . Jaundice    age 38  . Peripheral vascular disease ([place]) yrs ago   DVT left lower leg questionale told by 2 drs i had no clot, 1 md said i did  . Rheumatoid arthritis(714.0)   . Right knee pain   . Stroke ([place])    . Type II diabetes mellitus ([place])    type2    PSH:  Past Surgical History:  Procedure Laterality Date  . CATARACT EXTRACTION Bilateral   . COLONOSCOPY WITH PROPOFOL N/A 02/20/2015   Procedure: COLONOSCOPY WITH PROPOFOL;  Surgeon: Carol Ada, MD;  Location: [facility];  Service: Endoscopy;  Laterality: N/A;  . ENTEROSCOPY N/A 03/23/2016   Procedure: ENTEROSCOPY;  Surgeon: Carol Ada, MD;  Location: [facility];  Service: Endoscopy;  Laterality: N/A;  . ENTEROSCOPY N/A 06/17/2016   Procedure: ENTEROSCOPY;  Surgeon: Carol Ada, MD;  Location: [facility];  Service: Endoscopy;  Laterality: N/A;  . ESOPHAGOGASTRODUODENOSCOPY (EGD) WITH PROPOFOL N/A 02/20/2015   Procedure: ESOPHAGOGASTRODUODENOSCOPY (EGD) WITH PROPOFOL;  Surgeon: Carol Ada, MD;  Location: [facility];  Service: Endoscopy;  Laterality: N/A;  . GIVENS CAPSULE STUDY N/A 03/21/2016   Procedure: GIVENS CAPSULE STUDY;  Surgeon: Carol Ada, MD;  Location: [facility];  Service: Endoscopy;  Laterality: N/A;  . HAND SURGERY  1995 and 1996   artificial joints both hands  . JOINT REPLACEMENT    . LUMBAR LAMINECTOMY/DECOMPRESSION MICRODISCECTOMY N/A 02/10/2016   Procedure: MICROLUMBAR DECOMPRESSION L4-L5 AND L3- L4, AND EXCISION OF SYNOVIAL CYST L4-L5;  Surgeon: Susa Day, MD;  Location: [facility];  Service: Orthopedics;  Laterality: N/A;  . TOTAL HIP ARTHROPLASTY Left 2009  . TOTAL HIP ARTHROPLASTY Right 04/03/2013   Procedure: RIGHT TOTAL HIP ARTHROPLASTY ANTERIOR APPROACH;  Surgeon: Gearlean Alf, MD;  Location: [facility];  Service: Orthopedics;  Laterality: Right;    Social History:  Social History   Socioeconomic History  . Marital status: Divorced    Spouse name: Not on file  . Number of children: Not on file  . Years of education: Not on file  . Highest education level: Not on file  Occupational History  . Occupation: Retired  Scientific laboratory technician  . Financial resource strain: Not on file  . Food insecurity:      Worry: Not on file    Inability: Not on file  . Transportation needs:    Medical: Not on file    Non-medical: Not on file  Tobacco Use  . Smoking status: Never Smoker  . Smokeless tobacco: Never Used  Substance and Sexual Activity  . Alcohol use: No    Alcohol/week: 0.0 oz  . Drug use: No  . Sexual activity: Not Currently  Lifestyle  . Physical activity:    Days per week: Not on file    Minutes per session: Not on file  . Stress: Not on file  Relationships  . Social connections:    Talks on phone: Not on file    Gets together: Not on file    Attends religious service: Not on file    Active member of club or organization: Not on file    Attends meetings of clubs or  organizations: Not on file    Relationship status: Not on file  . Intimate partner violence:    Fear of current or ex partner: Not on file    Emotionally abused: Not on file    Physically abused: Not on file    Forced sexual activity: Not on file  Other Topics Concern  . Not on file  Social History Narrative  . Not on file    Family History:  Family History  Problem Relation Age of Onset  . Pneumonia Mother 37  . Stroke Father 8  . Heart failure Sister   . Hypertension Sister   . Heart attack Neg Hx     Medications:   Current Outpatient Medications on File Prior to Visit  Medication Sig Dispense Refill  . Abatacept (ORENCIA CLICKJECT) 854 MG/ML SOAJ Inject 125 mg into the skin every Thursday.     [name] acetaminophen (TYLENOL) 325 MG tablet Take 1-2 tablets (325-650 mg total) by mouth every 4 (four) hours as needed for mild pain.    [name] buPROPion (WELLBUTRIN SR) 150 MG 12 hr tablet Take 150 mg by mouth 2 (two) times daily.      . Cholecalciferol (VITAMIN D3) 1000 units CAPS Take 1,000 Units by mouth every evening.     [name] esomeprazole (NEXIUM) 40 MG capsule Take 40 mg by mouth every morning.     . ferrous gluconate (FERGON) 324 MG tablet Take 1 tablet (324 mg total) by mouth 2 (two) times daily with a  meal. 60 tablet 3  . furosemide (LASIX) 40 MG tablet Take 40 mg by mouth.    . gabapentin (NEURONTIN) 100 MG capsule Take 1 capsule (100 mg total) by mouth at bedtime.    [name] glimepiride (AMARYL) 2 MG tablet TAKE 1 TABLET DAILY BEFORE BREAKFAST    . leflunomide (ARAVA) 10 MG tablet Take 10 mg by mouth daily.    . meclizine (ANTIVERT) 12.5 MG tablet Take 12.5 mg by mouth every 4 (four) hours as needed for dizziness (Nausea).    . metFORMIN (GLUCOPHAGE-XR) 500 MG 24 hr tablet     . metoprolol tartrate (LOPRESSOR) 100 MG tablet Take 1 tablet (100 mg total) by mouth 2 (two) times daily for 2 days. 4 tablet 0  . Multiple Vitamins-Minerals (PRESERVISION AREDS 2) CAPS Take 1 capsule by mouth 2 (two) times daily.    . nitroGLYCERIN (NITROSTAT) 0.4 MG SL tablet Place 1 tablet (0.4 mg total) under the tongue every 5 (five) minutes as needed for chest pain (x 3 doses). 25 tablet 2  . potassium chloride SA (K-DUR,KLOR-CON) 20 MEQ tablet Take 1 tablet (20 mEq total) by mouth daily.    . predniSONE (DELTASONE) 5 MG tablet Take 5 mg by mouth daily.    . QUEtiapine (SEROQUEL) 25 MG tablet Take 1 tablet (25 mg total) by mouth 3 (three) times daily as needed (delerium or aggitation). 30 tablet 0  . Rivaroxaban (XARELTO) 15 MG TABS tablet Take 1 tablet (15 mg total) by mouth daily. 30 tablet 0  . rosuvastatin (CRESTOR) 40 MG tablet Take 1 tablet (40 mg total) by mouth every evening.    . senna-docusate (SENOKOT-S) 8.6-50 MG tablet Take 1 tablet by mouth at bedtime as needed for mild constipation.    . vitamin B-12 1000 MCG tablet Take 1 tablet (1,000 mcg total) by mouth every evening.    [name] VOLTAREN 1 % GEL Apply 2 g topically at bedtime as needed for pain. Knees, calf  2  .  diltiazem (CARDIZEM CD) 360 MG 24 hr capsule Take 1 capsule (360 mg total) by mouth daily for 2 days. 2 capsule 0   No current facility-administered medications on file prior to visit.     Allergies:  No Known Allergies   Physical  Exam  Vitals:   08/03/17 1045  BP: 140/84  Pulse: 72  Height: 5\' 6"  (1.676 m)   Body mass index is 28.25 kg/m. No exam data present  General: well developed, pleasant elderly caucasian female, well nourished, seated, in no evident distress Head: head normocephalic and atraumatic.   Neck: supple with no carotid or supraclavicular bruits Cardiovascular: irregular rate and rhythm, no murmurs Musculoskeletal: no deformity Skin:  no rash/petichiae Vascular:  Normal pulses all extremities  Neurologic Exam Mental Status: Awake and fully alert. Oriented to place and time. AFT 8. Recall 2/3. Clock drawing 3/4. Serial addition good. Cranial Nerves: Fundoscopic exam reveals sharp disc margins. Pupils equal, briskly reactive to light. Extraocular movements full without nystagmus. Mild left homonymous superior quadrantanopia. Hearing intact. Facial sensation intact. Face, tongue, palate moves normally and symmetrically.  Motor: Normal bulk and tone. LLE 3/4, bilateral 5/5 strength in hip flexors; weak left ankle dorsiflexion.  Sensory.: intact to touch , pinprick , position and vibratory sensation.  Coordination: Rapid alternating movements normal in all extremities. Finger-to-nose and heel-to-shin performed accurately bilaterally. Gait and Station: Currently sitting in w/c which patient was not using prior to stroke. She was using cane prior to stroke. Unable to test ambulation.  Reflexes: 1+ and symmetric. Toes downgoing.    NIHSS  2 Modified Rankin  4 HAS-BLED 2 CHA2DS2-VASc 6   Diagnostic Data (Labs, Imaging, Testing)  CT HEAD WO CONTRAST 06/14/17 IMPRESSION: 1. Acute right posterior cerebral artery infarct.  No hemorrhage. 2. Atrophy and chronic microvascular white matter ischemic changes. 3. Remote right cerebellar infarct.  CTA HEAD W OR WO CONTRAST CTA NECK W OR WO CONTRAST 06/14/17 IMPRESSION: 1. No emergent large vessel occlusion or flow-limiting stenosis. 2.  Redemonstration of right PCA territory infarct with expected evolution, but no hemorrhage or mass effect. The right posterior cerebral artery is angiographically patent. 3. Mild atherosclerotic calcification at the aortic arch, carotid bifurcations and skull base segments of the internal carotid arteries without hemodynamically significant stenosis by NASCET criteria. ( Aortic Atherosclerosis (ICD10-I70.0). ) 4. **An incidental finding of potential clinical significance has been found. Multiple thyroid nodules, measuring up to 3 cm. Dedicated thyroid ultrasound is recommended for further characterization on a nonemergent outpatient basis. **  MR BRAIN WO CONTRAST 06/14/17 IMPRESSION: 1. Acute infarcts of the posterior right middle cerebral artery territory and the right posterior cerebral artery territory. 2. Petechial hemorrhage in the distribution of the right PCA infarct. No intraparenchymal hematoma. No mass effect or hydrocephalus. 3. Chronic small vessel disease.  ECHOCARDIOGRAM COMPLETE 06/16/17 Study Conclusions - Left ventricle: The cavity size was normal. There was moderate   concentric hypertrophy. Systolic function was normal. The   estimated ejection fraction was in the range of 60% to 65%. Wall   motion was normal; there were no regional wall motion   abnormalities. - Mitral valve: Calcified annulus. Moderately thickened, moderately   calcified leaflets . The findings are consistent with moderate   stenosis. There was mild to moderate regurgitation. Valve area by   pressure half-time: 2.22 cm^2. Valve area by continuity equation   (using LVOT flow): 1.53 cm^2. - Right ventricle: The cavity size was mildly dilated. Wall   thickness was normal. Systolic function  was mildly reduced. - Pulmonary arteries: Systolic pressure was moderately increased.   PA peak pressure: 49 mm Hg (S). - Pericardium, extracardiac: There was no pericardial effusion.   ASSESSMENT: [name] is a 72 y.o. year old female here with right MCA and right PCA on 06/1317 secondary to atrial fibrillation. Vascular risk factors include atrial fibrillation and HLD.     PLAN: -Continue Xarelto (rivaroxaban) daily  and crestor 20  for secondary stroke prevention -F/u with PCP regarding your HLD management - PCP to prescribe -recommend CoQ10 200mg  daily for muscle aches/pains -Referral for sleep study -F/u with cardiologist regarding atrial fibrillation and Xarelto managment -continue to monitor BP at home -referral for PT/OT at [facility] strict control of hypertension with blood pressure goal below 130/90, diabetes with hemoglobin A1c goal below 6.5% and cholesterol with LDL cholesterol (bad cholesterol) goal below 70 mg/dL. I also advised the patient to eat a healthy diet with plenty of whole grains, cereals, fruits and vegetables, exercise regularly and maintain ideal body weight.  Follow up in 3 months or call earlier if needed  Greater than 50% time during this 25 minute consultation visit was spent on counseling and coordination of care about HLD, and a fib, discussion about risk benefit of anticoagulation and answering questions.   Venancio Poisson, AGNP-BC  [facility] [address] [place], [place] [address]  Phone [phone] Fax [phone]

## 2017-08-03 NOTE — Patient Instructions (Signed)
Continue Xarelto (rivaroxaban) daily  and decrease crestor from 40mg  to 20mg  for secondary stroke prevention  Try CoQ10 200mg  daily for muscle aches and pain  Continue to follow up with PCP regarding cholesterol and blood pressure management  Continue PT/OT - will place orders to ensure she continues to receive therapies at [facility]  Maintain strict control of hypertension with blood pressure goal below 130/90, diabetes with hemoglobin A1c goal below 6.5% and cholesterol with LDL cholesterol (bad cholesterol) goal below 70 mg/dL. I also advised the patient to eat a healthy diet with plenty of whole grains, cereals, fruits and vegetables, exercise regularly and maintain ideal body weight.  Followup in the future with me in 3 months or call earlier if needed

## 2017-08-04 DIAGNOSIS — I5032 Chronic diastolic (congestive) heart failure: Secondary | ICD-10-CM | POA: Diagnosis not present

## 2017-08-04 DIAGNOSIS — E876 Hypokalemia: Secondary | ICD-10-CM | POA: Diagnosis present

## 2017-08-04 DIAGNOSIS — I482 Chronic atrial fibrillation: Secondary | ICD-10-CM | POA: Diagnosis not present

## 2017-08-04 DIAGNOSIS — E1151 Type 2 diabetes mellitus with diabetic peripheral angiopathy without gangrene: Secondary | ICD-10-CM | POA: Diagnosis present

## 2017-08-04 DIAGNOSIS — R509 Fever, unspecified: Secondary | ICD-10-CM | POA: Diagnosis not present

## 2017-08-04 DIAGNOSIS — I69398 Other sequelae of cerebral infarction: Secondary | ICD-10-CM | POA: Diagnosis not present

## 2017-08-04 DIAGNOSIS — I63411 Cerebral infarction due to embolism of right middle cerebral artery: Secondary | ICD-10-CM | POA: Diagnosis not present

## 2017-08-04 DIAGNOSIS — E1122 Type 2 diabetes mellitus with diabetic chronic kidney disease: Secondary | ICD-10-CM | POA: Diagnosis present

## 2017-08-04 DIAGNOSIS — Z7901 Long term (current) use of anticoagulants: Secondary | ICD-10-CM | POA: Diagnosis not present

## 2017-08-04 DIAGNOSIS — I69915 Cognitive social or emotional deficit following unspecified cerebrovascular disease: Secondary | ICD-10-CM | POA: Diagnosis not present

## 2017-08-04 DIAGNOSIS — E1165 Type 2 diabetes mellitus with hyperglycemia: Secondary | ICD-10-CM | POA: Diagnosis present

## 2017-08-04 DIAGNOSIS — J189 Pneumonia, unspecified organism: Secondary | ICD-10-CM | POA: Diagnosis not present

## 2017-08-04 DIAGNOSIS — H53462 Homonymous bilateral field defects, left side: Secondary | ICD-10-CM | POA: Diagnosis not present

## 2017-08-04 DIAGNOSIS — Z6827 Body mass index (BMI) 27.0-27.9, adult: Secondary | ICD-10-CM | POA: Diagnosis not present

## 2017-08-04 DIAGNOSIS — G8331 Monoplegia, unspecified affecting right dominant side: Secondary | ICD-10-CM | POA: Diagnosis not present

## 2017-08-04 DIAGNOSIS — R0989 Other specified symptoms and signs involving the circulatory and respiratory systems: Secondary | ICD-10-CM | POA: Diagnosis not present

## 2017-08-04 DIAGNOSIS — Z9841 Cataract extraction status, right eye: Secondary | ICD-10-CM | POA: Diagnosis not present

## 2017-08-04 DIAGNOSIS — I509 Heart failure, unspecified: Secondary | ICD-10-CM | POA: Diagnosis not present

## 2017-08-04 DIAGNOSIS — H353130 Nonexudative age-related macular degeneration, bilateral, stage unspecified: Secondary | ICD-10-CM | POA: Diagnosis not present

## 2017-08-04 DIAGNOSIS — E119 Type 2 diabetes mellitus without complications: Secondary | ICD-10-CM | POA: Diagnosis not present

## 2017-08-04 DIAGNOSIS — N3090 Cystitis, unspecified without hematuria: Secondary | ICD-10-CM | POA: Diagnosis not present

## 2017-08-04 DIAGNOSIS — Z9842 Cataract extraction status, left eye: Secondary | ICD-10-CM | POA: Diagnosis not present

## 2017-08-04 DIAGNOSIS — M2550 Pain in unspecified joint: Secondary | ICD-10-CM | POA: Diagnosis not present

## 2017-08-04 DIAGNOSIS — M5030 Other cervical disc degeneration, unspecified cervical region: Secondary | ICD-10-CM | POA: Diagnosis not present

## 2017-08-04 DIAGNOSIS — Z794 Long term (current) use of insulin: Secondary | ICD-10-CM | POA: Diagnosis not present

## 2017-08-04 DIAGNOSIS — R6520 Severe sepsis without septic shock: Secondary | ICD-10-CM | POA: Diagnosis present

## 2017-08-04 DIAGNOSIS — J181 Lobar pneumonia, unspecified organism: Secondary | ICD-10-CM | POA: Diagnosis not present

## 2017-08-04 DIAGNOSIS — D518 Other vitamin B12 deficiency anemias: Secondary | ICD-10-CM | POA: Diagnosis not present

## 2017-08-04 DIAGNOSIS — M6281 Muscle weakness (generalized): Secondary | ICD-10-CM | POA: Diagnosis not present

## 2017-08-04 DIAGNOSIS — R269 Unspecified abnormalities of gait and mobility: Secondary | ICD-10-CM | POA: Diagnosis not present

## 2017-08-04 DIAGNOSIS — M25532 Pain in left wrist: Secondary | ICD-10-CM | POA: Diagnosis not present

## 2017-08-04 DIAGNOSIS — I471 Supraventricular tachycardia: Secondary | ICD-10-CM | POA: Diagnosis present

## 2017-08-04 DIAGNOSIS — R300 Dysuria: Secondary | ICD-10-CM | POA: Diagnosis not present

## 2017-08-04 DIAGNOSIS — I739 Peripheral vascular disease, unspecified: Secondary | ICD-10-CM | POA: Diagnosis not present

## 2017-08-04 DIAGNOSIS — E7800 Pure hypercholesterolemia, unspecified: Secondary | ICD-10-CM | POA: Diagnosis not present

## 2017-08-04 DIAGNOSIS — H40013 Open angle with borderline findings, low risk, bilateral: Secondary | ICD-10-CM | POA: Diagnosis not present

## 2017-08-04 DIAGNOSIS — E118 Type 2 diabetes mellitus with unspecified complications: Secondary | ICD-10-CM | POA: Diagnosis not present

## 2017-08-04 DIAGNOSIS — M25561 Pain in right knee: Secondary | ICD-10-CM | POA: Diagnosis not present

## 2017-08-04 DIAGNOSIS — D631 Anemia in chronic kidney disease: Secondary | ICD-10-CM | POA: Diagnosis present

## 2017-08-04 DIAGNOSIS — R05 Cough: Secondary | ICD-10-CM | POA: Diagnosis not present

## 2017-08-04 DIAGNOSIS — N390 Urinary tract infection, site not specified: Secondary | ICD-10-CM | POA: Diagnosis not present

## 2017-08-04 DIAGNOSIS — Z96643 Presence of artificial hip joint, bilateral: Secondary | ICD-10-CM | POA: Diagnosis present

## 2017-08-04 DIAGNOSIS — I10 Essential (primary) hypertension: Secondary | ICD-10-CM | POA: Diagnosis not present

## 2017-08-04 DIAGNOSIS — R4182 Altered mental status, unspecified: Secondary | ICD-10-CM | POA: Diagnosis not present

## 2017-08-04 DIAGNOSIS — R531 Weakness: Secondary | ICD-10-CM | POA: Diagnosis present

## 2017-08-04 DIAGNOSIS — M069 Rheumatoid arthritis, unspecified: Secondary | ICD-10-CM | POA: Diagnosis present

## 2017-08-04 DIAGNOSIS — I63431 Cerebral infarction due to embolism of right posterior cerebral artery: Secondary | ICD-10-CM | POA: Diagnosis not present

## 2017-08-04 DIAGNOSIS — R600 Localized edema: Secondary | ICD-10-CM | POA: Diagnosis not present

## 2017-08-04 DIAGNOSIS — E042 Nontoxic multinodular goiter: Secondary | ICD-10-CM | POA: Diagnosis not present

## 2017-08-04 DIAGNOSIS — Z7952 Long term (current) use of systemic steroids: Secondary | ICD-10-CM | POA: Diagnosis not present

## 2017-08-04 DIAGNOSIS — K219 Gastro-esophageal reflux disease without esophagitis: Secondary | ICD-10-CM | POA: Diagnosis not present

## 2017-08-04 DIAGNOSIS — G309 Alzheimer's disease, unspecified: Secondary | ICD-10-CM | POA: Diagnosis not present

## 2017-08-04 DIAGNOSIS — I130 Hypertensive heart and chronic kidney disease with heart failure and stage 1 through stage 4 chronic kidney disease, or unspecified chronic kidney disease: Secondary | ICD-10-CM | POA: Diagnosis present

## 2017-08-04 DIAGNOSIS — R0602 Shortness of breath: Secondary | ICD-10-CM | POA: Diagnosis not present

## 2017-08-04 DIAGNOSIS — E785 Hyperlipidemia, unspecified: Secondary | ICD-10-CM | POA: Diagnosis not present

## 2017-08-04 DIAGNOSIS — F419 Anxiety disorder, unspecified: Secondary | ICD-10-CM | POA: Diagnosis present

## 2017-08-04 DIAGNOSIS — Z79899 Other long term (current) drug therapy: Secondary | ICD-10-CM | POA: Diagnosis not present

## 2017-08-04 DIAGNOSIS — I69319 Unspecified symptoms and signs involving cognitive functions following cerebral infarction: Secondary | ICD-10-CM | POA: Diagnosis not present

## 2017-08-04 DIAGNOSIS — R41840 Attention and concentration deficit: Secondary | ICD-10-CM | POA: Diagnosis not present

## 2017-08-04 DIAGNOSIS — I639 Cerebral infarction, unspecified: Secondary | ICD-10-CM | POA: Diagnosis not present

## 2017-08-04 DIAGNOSIS — H35033 Hypertensive retinopathy, bilateral: Secondary | ICD-10-CM | POA: Diagnosis not present

## 2017-08-04 DIAGNOSIS — D500 Iron deficiency anemia secondary to blood loss (chronic): Secondary | ICD-10-CM | POA: Diagnosis not present

## 2017-08-04 DIAGNOSIS — D649 Anemia, unspecified: Secondary | ICD-10-CM | POA: Diagnosis not present

## 2017-08-04 DIAGNOSIS — M05752 Rheumatoid arthritis with rheumatoid factor of left hip without organ or systems involvement: Secondary | ICD-10-CM | POA: Diagnosis not present

## 2017-08-04 DIAGNOSIS — K449 Diaphragmatic hernia without obstruction or gangrene: Secondary | ICD-10-CM | POA: Diagnosis not present

## 2017-08-04 DIAGNOSIS — E782 Mixed hyperlipidemia: Secondary | ICD-10-CM | POA: Diagnosis not present

## 2017-08-04 DIAGNOSIS — Z8249 Family history of ischemic heart disease and other diseases of the circulatory system: Secondary | ICD-10-CM | POA: Diagnosis not present

## 2017-08-04 DIAGNOSIS — R296 Repeated falls: Secondary | ICD-10-CM | POA: Diagnosis not present

## 2017-08-04 DIAGNOSIS — M170 Bilateral primary osteoarthritis of knee: Secondary | ICD-10-CM | POA: Diagnosis present

## 2017-08-04 DIAGNOSIS — G5600 Carpal tunnel syndrome, unspecified upper limb: Secondary | ICD-10-CM | POA: Diagnosis not present

## 2017-08-04 DIAGNOSIS — E559 Vitamin D deficiency, unspecified: Secondary | ICD-10-CM | POA: Diagnosis not present

## 2017-08-04 DIAGNOSIS — F333 Major depressive disorder, recurrent, severe with psychotic symptoms: Secondary | ICD-10-CM | POA: Diagnosis not present

## 2017-08-04 DIAGNOSIS — F0390 Unspecified dementia without behavioral disturbance: Secondary | ICD-10-CM | POA: Diagnosis not present

## 2017-08-04 DIAGNOSIS — I69312 Visuospatial deficit and spatial neglect following cerebral infarction: Secondary | ICD-10-CM | POA: Diagnosis not present

## 2017-08-04 DIAGNOSIS — Z823 Family history of stroke: Secondary | ICD-10-CM | POA: Diagnosis not present

## 2017-08-04 DIAGNOSIS — Y95 Nosocomial condition: Secondary | ICD-10-CM | POA: Diagnosis present

## 2017-08-04 DIAGNOSIS — I4891 Unspecified atrial fibrillation: Secondary | ICD-10-CM | POA: Diagnosis not present

## 2017-08-04 DIAGNOSIS — M0589 Other rheumatoid arthritis with rheumatoid factor of multiple sites: Secondary | ICD-10-CM | POA: Diagnosis not present

## 2017-08-04 DIAGNOSIS — B199 Unspecified viral hepatitis without hepatic coma: Secondary | ICD-10-CM | POA: Diagnosis not present

## 2017-08-04 DIAGNOSIS — R270 Ataxia, unspecified: Secondary | ICD-10-CM | POA: Diagnosis not present

## 2017-08-04 DIAGNOSIS — A419 Sepsis, unspecified organism: Secondary | ICD-10-CM | POA: Diagnosis not present

## 2017-08-04 DIAGNOSIS — Z8673 Personal history of transient ischemic attack (TIA), and cerebral infarction without residual deficits: Secondary | ICD-10-CM | POA: Diagnosis not present

## 2017-08-04 DIAGNOSIS — N183 Chronic kidney disease, stage 3 (moderate): Secondary | ICD-10-CM | POA: Diagnosis not present

## 2017-08-04 DIAGNOSIS — I5033 Acute on chronic diastolic (congestive) heart failure: Secondary | ICD-10-CM | POA: Diagnosis present

## 2017-08-04 DIAGNOSIS — E663 Overweight: Secondary | ICD-10-CM | POA: Diagnosis not present

## 2017-08-04 NOTE — Progress Notes (Signed)
I reviewed above note and agree with the assessment and plan. She was on crestor 10 prior to last admission, and LDL 66. If she continues to have intolerance with crestor, we can further lower her crestor back to 10mg . Thanks.    Rosalin Hawking, MD PhD Stroke Neurology 08/04/2017 6:10 PM

## 2017-08-09 ENCOUNTER — Ambulatory Visit
Admission: RE | Admit: 2017-08-09 | Discharge: 2017-08-09 | Disposition: A | Discharge status: Home / Self Care | Payer: Medicare Other | Source: Ambulatory Visit | Attending: Family Medicine | Admitting: Family Medicine

## 2017-08-09 DIAGNOSIS — E041 Nontoxic single thyroid nodule: Secondary | ICD-10-CM

## 2017-08-09 DIAGNOSIS — E042 Nontoxic multinodular goiter: Secondary | ICD-10-CM | POA: Diagnosis not present

## 2017-08-10 ENCOUNTER — Encounter: Payer: No Typology Code available for payment source | Attending: Physical Medicine & Rehabilitation

## 2017-08-10 ENCOUNTER — Encounter: Payer: Self-pay | Admitting: Physical Medicine & Rehabilitation

## 2017-08-10 ENCOUNTER — Ambulatory Visit (HOSPITAL_BASED_OUTPATIENT_CLINIC_OR_DEPARTMENT_OTHER): Payer: Medicare Other | Admitting: Physical Medicine & Rehabilitation

## 2017-08-10 VITALS — BP 110/67 | HR 97 | Ht 67.0 in | Wt 148.0 lb

## 2017-08-10 DIAGNOSIS — I639 Cerebral infarction, unspecified: Secondary | ICD-10-CM | POA: Diagnosis not present

## 2017-08-10 DIAGNOSIS — I69319 Unspecified symptoms and signs involving cognitive functions following cerebral infarction: Secondary | ICD-10-CM | POA: Insufficient documentation

## 2017-08-10 DIAGNOSIS — Z823 Family history of stroke: Secondary | ICD-10-CM | POA: Insufficient documentation

## 2017-08-10 DIAGNOSIS — Z96643 Presence of artificial hip joint, bilateral: Secondary | ICD-10-CM | POA: Insufficient documentation

## 2017-08-10 DIAGNOSIS — Z9841 Cataract extraction status, right eye: Secondary | ICD-10-CM | POA: Insufficient documentation

## 2017-08-10 DIAGNOSIS — I4891 Unspecified atrial fibrillation: Secondary | ICD-10-CM | POA: Insufficient documentation

## 2017-08-10 DIAGNOSIS — I69398 Other sequelae of cerebral infarction: Secondary | ICD-10-CM | POA: Diagnosis not present

## 2017-08-10 DIAGNOSIS — Z9842 Cataract extraction status, left eye: Secondary | ICD-10-CM | POA: Insufficient documentation

## 2017-08-10 DIAGNOSIS — H53462 Homonymous bilateral field defects, left side: Secondary | ICD-10-CM

## 2017-08-10 DIAGNOSIS — Z7901 Long term (current) use of anticoagulants: Secondary | ICD-10-CM | POA: Insufficient documentation

## 2017-08-10 DIAGNOSIS — G8331 Monoplegia, unspecified affecting right dominant side: Secondary | ICD-10-CM | POA: Diagnosis not present

## 2017-08-10 DIAGNOSIS — M6281 Muscle weakness (generalized): Secondary | ICD-10-CM | POA: Insufficient documentation

## 2017-08-10 DIAGNOSIS — Z8673 Personal history of transient ischemic attack (TIA), and cerebral infarction without residual deficits: Secondary | ICD-10-CM

## 2017-08-10 DIAGNOSIS — Z8249 Family history of ischemic heart disease and other diseases of the circulatory system: Secondary | ICD-10-CM | POA: Insufficient documentation

## 2017-08-10 DIAGNOSIS — M069 Rheumatoid arthritis, unspecified: Secondary | ICD-10-CM | POA: Insufficient documentation

## 2017-08-10 DIAGNOSIS — I69312 Visuospatial deficit and spatial neglect following cerebral infarction: Secondary | ICD-10-CM | POA: Insufficient documentation

## 2017-08-10 DIAGNOSIS — E119 Type 2 diabetes mellitus without complications: Secondary | ICD-10-CM | POA: Diagnosis not present

## 2017-08-10 DIAGNOSIS — R269 Unspecified abnormalities of gait and mobility: Secondary | ICD-10-CM | POA: Insufficient documentation

## 2017-08-10 NOTE — Progress Notes (Signed)
Subjective:    Patient ID: [name], female    DOB: 1945/09/22, 72 y.o.   MRN: [id number] 72 y.o. female with history of Afib, diabetes and RA who presented on 06/14/17 with headache and blurred vision after having stopped xarelto on 06/12/17 for nerve block.  MRI revealed acute infarcts of the posterior right middle cerebral artery territory and the right posterior cerebral artery territory with petechial hemorrhage in the distribution of the right PCA infarct. Dr. Erlinda Hong felt that stroke embolic due to known A Fib--on  325mg  ECASA daily up due to size of stroke. To resume Xarelto in 7 days. Patient with ongoing left field cut with deficits in visual-spatial awareness and coordination, left inattention, significant cognitive deficits (MoCA 10/30) with lack of awareness of deficits and unsteady gait  Admit date: 06/19/2017 Discharge date: 07/05/2017 Date of Admission: 07/24/2017                      Date of Discharge: 07/27/17    HPI Standing up to 9 min Mod I with potty chair No help with dressing and bathing Has Large room with bathroom pt in assisted living  ALF aide is with pt.  She reports med aides dispense all meds.  Pt admits to depression but denies suicidal thoughts or having a plan   RLE weakness for 1 year, patient states that this is interfering with her ability to ambulate.   Pain Inventory Average Pain 0 Pain Right Now 0 My pain is na  In the last 24 hours, has pain interfered with the following? General activity 0 Relation with others 0 Enjoyment of life 0 What TIME of day is your pain at its worst? na Sleep (in general) Good  Pain is worse with: na Pain improves with: na Relief from Meds: na  Mobility use a walker do you drive?  no use a wheelchair needs help with transfers  Function disabled: date disabled . I need assistance with the following:  household duties and shopping  Neuro/Psych trouble walking loss of taste or smell suicidal thoughts has  plan  Prior Studies Any changes since last visit?  no  Physicians involved in your care Any changes since last visit?  no   Family History  Problem Relation Age of Onset  . Pneumonia Mother 59  . Stroke Father 64  . Heart failure Sister   . Hypertension Sister   . Heart attack Neg Hx    Social History   Socioeconomic History  . Marital status: Divorced    Spouse name: Not on file  . Number of children: Not on file  . Years of education: Not on file  . Highest education level: Not on file  Occupational History  . Occupation: Retired  Scientific laboratory technician  . Financial resource strain: Not on file  . Food insecurity:    Worry: Not on file    Inability: Not on file  . Transportation needs:    Medical: Not on file    Non-medical: Not on file  Tobacco Use  . Smoking status: Never Smoker  . Smokeless tobacco: Never Used  Substance and Sexual Activity  . Alcohol use: No    Alcohol/week: 0.0 oz  . Drug use: No  . Sexual activity: Not Currently  Lifestyle  . Physical activity:    Days per week: Not on file    Minutes per session: Not on file  . Stress: Not on file  Relationships  . Social connections:  Talks on phone: Not on file    Gets together: Not on file    Attends religious service: Not on file    Active member of club or organization: Not on file    Attends meetings of clubs or organizations: Not on file    Relationship status: Not on file  Other Topics Concern  . Not on file  Social History Narrative  . Not on file   Past Surgical History:  Procedure Laterality Date  . CATARACT EXTRACTION Bilateral   . COLONOSCOPY WITH PROPOFOL N/A 02/20/2015   Procedure: COLONOSCOPY WITH PROPOFOL;  Surgeon: Carol Ada, MD;  Location: [facility];  Service: Endoscopy;  Laterality: N/A;  . ENTEROSCOPY N/A 03/23/2016   Procedure: ENTEROSCOPY;  Surgeon: Carol Ada, MD;  Location: [facility];  Service: Endoscopy;  Laterality: N/A;  . ENTEROSCOPY N/A 06/17/2016    Procedure: ENTEROSCOPY;  Surgeon: Carol Ada, MD;  Location: [facility];  Service: Endoscopy;  Laterality: N/A;  . ESOPHAGOGASTRODUODENOSCOPY (EGD) WITH PROPOFOL N/A 02/20/2015   Procedure: ESOPHAGOGASTRODUODENOSCOPY (EGD) WITH PROPOFOL;  Surgeon: Carol Ada, MD;  Location: [facility];  Service: Endoscopy;  Laterality: N/A;  . GIVENS CAPSULE STUDY N/A 03/21/2016   Procedure: GIVENS CAPSULE STUDY;  Surgeon: Carol Ada, MD;  Location: [facility];  Service: Endoscopy;  Laterality: N/A;  . HAND SURGERY  1995 and 1996   artificial joints both hands  . JOINT REPLACEMENT    . LUMBAR LAMINECTOMY/DECOMPRESSION MICRODISCECTOMY N/A 02/10/2016   Procedure: MICROLUMBAR DECOMPRESSION L4-L5 AND L3- L4, AND EXCISION OF SYNOVIAL CYST L4-L5;  Surgeon: Susa Day, MD;  Location: [facility];  Service: Orthopedics;  Laterality: N/A;  . TOTAL HIP ARTHROPLASTY Left 2009  . TOTAL HIP ARTHROPLASTY Right 04/03/2013   Procedure: RIGHT TOTAL HIP ARTHROPLASTY ANTERIOR APPROACH;  Surgeon: Gearlean Alf, MD;  Location: [facility];  Service: Orthopedics;  Laterality: Right;   Past Medical History:  Diagnosis Date  . Abnormal LFTs 07/24/2017  . Actinic keratosis   . Anemia    hx of  . Anemia of chronic renal failure, stage 3 (moderate) ([place]) 07/17/2015  . Anticoagulant causing adverse effect in therapeutic use 07/17/2015  . Anxiety   . Arthritis    rheumatoid  . Atrial fibrillation ([place])    a. persistent, she remains on Xarelto  . Carpal tunnel syndrome   . Chronic diastolic (congestive) heart failure ([place])    a. 04/2016: echo showing a preserved EF of 55-60% with mild MR. LA and RA midly dilated.   . Edema    left leg at ankle resolved now  . Gastroesophageal reflux disease   . Hepatitis 1970   not sure what kind  . Hiatal hernia   . Hyperlipidemia   . Hypertension   . Iron deficiency anemia due to chronic blood loss 07/17/2015  . Jaundice    age 26  . Peripheral vascular disease ([place]) yrs ago   DVT  left lower leg questionale told by 2 drs i had no clot, 1 md said i did  . Rheumatoid arthritis(714.0)   . Right knee pain   . Stroke ([place])   . Type II diabetes mellitus ([facility])    type2   BP 110/67   Pulse 97   Ht 5\' 7"  (1.702 m)   Wt 148 lb (67.1 kg)   SpO2 96%   BMI 23.18 kg/m   Opioid Risk Score:   Fall Risk Score:  `1  Depression screen PHQ 2/9  No flowsheet data found.   Review of  Systems  Constitutional: Negative.   HENT: Negative.   Eyes: Negative.   Respiratory: Negative.   Cardiovascular: Negative.   Gastrointestinal: Negative.   Endocrine: Negative.   Genitourinary: Negative.   Musculoskeletal: Positive for gait problem.  Skin: Negative.   Allergic/Immunologic: Negative.   Hematological: Negative.   Psychiatric/Behavioral: Positive for dysphoric mood.  All other systems reviewed and are negative.      Objective:   Physical Exam  Neurological:  Reflex Scores:      Patellar reflexes are 0 on the right side and 1+ on the left side.  Oriented to person place and time Immediate recall 3/3, 3/3 after 102min  Left lower quadrantanopsia  Motor strength is 5/5 bilateral deltoid, bicep, tricep, grip 3- right hip flexor, 5 left hip flexor 3- right knee extensor 5 left knee extensor 5/5 right ankle dorsiflexor, 5/5 left ankle dorsiflexor  Sensation is reduced over the right anterior thigh as well as medial thigh but intact over the lateral thigh intact over the foot and ankle area.  Tone is reduced right lower extremity with decreased reflex at the knee on the right side compared to the left side  There is right quadricep atrophy.  No pain with knee range of motion no evidence of swelling in the right knee.    Assessment & Plan:  1.  Right quadricep and Fick hip flexor weakness as well as sensory deficit in the anterior and medial thigh.  Differential diagnosis includes upper lumbar radiculopathy i.e. L2-L3 versus lumbar plexopathy.  She has been on  anticoagulation which puts her at risk for possible retroperitoneal hematoma.  Patient notes onset of approximately 1 year ago.  Given her degree of quadricep atrophy this does appear to be a chronic process. We will check EMG/NCV depending on results either order pelvic CT or if it appears to be more radicular in MRI lumbar spine.

## 2017-08-10 NOTE — Patient Instructions (Signed)
Will do nerve study to evaluate cause of Right thigh weakness

## 2017-08-11 DIAGNOSIS — D631 Anemia in chronic kidney disease: Secondary | ICD-10-CM | POA: Diagnosis not present

## 2017-08-11 DIAGNOSIS — K219 Gastro-esophageal reflux disease without esophagitis: Secondary | ICD-10-CM | POA: Diagnosis not present

## 2017-08-11 DIAGNOSIS — F419 Anxiety disorder, unspecified: Secondary | ICD-10-CM | POA: Diagnosis not present

## 2017-08-11 DIAGNOSIS — F333 Major depressive disorder, recurrent, severe with psychotic symptoms: Secondary | ICD-10-CM | POA: Diagnosis not present

## 2017-08-17 DIAGNOSIS — K219 Gastro-esophageal reflux disease without esophagitis: Secondary | ICD-10-CM | POA: Diagnosis not present

## 2017-08-17 DIAGNOSIS — F419 Anxiety disorder, unspecified: Secondary | ICD-10-CM | POA: Diagnosis not present

## 2017-08-17 DIAGNOSIS — D631 Anemia in chronic kidney disease: Secondary | ICD-10-CM | POA: Diagnosis not present

## 2017-08-17 DIAGNOSIS — F333 Major depressive disorder, recurrent, severe with psychotic symptoms: Secondary | ICD-10-CM | POA: Diagnosis not present

## 2017-08-22 ENCOUNTER — Other Ambulatory Visit: Payer: Self-pay | Admitting: Cardiology

## 2017-08-22 NOTE — Telephone Encounter (Signed)
REFILL 

## 2017-09-05 ENCOUNTER — Ambulatory Visit (HOSPITAL_BASED_OUTPATIENT_CLINIC_OR_DEPARTMENT_OTHER): Payer: Medicare Other | Admitting: Physical Medicine & Rehabilitation

## 2017-09-05 ENCOUNTER — Encounter: Payer: Self-pay | Admitting: Physical Medicine & Rehabilitation

## 2017-09-05 ENCOUNTER — Encounter: Payer: No Typology Code available for payment source | Attending: Physical Medicine & Rehabilitation

## 2017-09-05 VITALS — BP 121/69 | HR 72 | Ht 67.0 in | Wt 164.0 lb

## 2017-09-05 DIAGNOSIS — M6281 Muscle weakness (generalized): Secondary | ICD-10-CM | POA: Diagnosis not present

## 2017-09-05 DIAGNOSIS — Z8249 Family history of ischemic heart disease and other diseases of the circulatory system: Secondary | ICD-10-CM | POA: Insufficient documentation

## 2017-09-05 DIAGNOSIS — Z7901 Long term (current) use of anticoagulants: Secondary | ICD-10-CM | POA: Insufficient documentation

## 2017-09-05 DIAGNOSIS — I69398 Other sequelae of cerebral infarction: Secondary | ICD-10-CM | POA: Insufficient documentation

## 2017-09-05 DIAGNOSIS — Z9842 Cataract extraction status, left eye: Secondary | ICD-10-CM | POA: Insufficient documentation

## 2017-09-05 DIAGNOSIS — Z9841 Cataract extraction status, right eye: Secondary | ICD-10-CM | POA: Insufficient documentation

## 2017-09-05 DIAGNOSIS — M069 Rheumatoid arthritis, unspecified: Secondary | ICD-10-CM | POA: Insufficient documentation

## 2017-09-05 DIAGNOSIS — I69312 Visuospatial deficit and spatial neglect following cerebral infarction: Secondary | ICD-10-CM | POA: Diagnosis not present

## 2017-09-05 DIAGNOSIS — I69319 Unspecified symptoms and signs involving cognitive functions following cerebral infarction: Secondary | ICD-10-CM | POA: Diagnosis not present

## 2017-09-05 DIAGNOSIS — E119 Type 2 diabetes mellitus without complications: Secondary | ICD-10-CM | POA: Diagnosis not present

## 2017-09-05 DIAGNOSIS — Z96643 Presence of artificial hip joint, bilateral: Secondary | ICD-10-CM | POA: Insufficient documentation

## 2017-09-05 DIAGNOSIS — I4891 Unspecified atrial fibrillation: Secondary | ICD-10-CM | POA: Insufficient documentation

## 2017-09-05 DIAGNOSIS — R269 Unspecified abnormalities of gait and mobility: Secondary | ICD-10-CM | POA: Insufficient documentation

## 2017-09-05 DIAGNOSIS — G8331 Monoplegia, unspecified affecting right dominant side: Secondary | ICD-10-CM | POA: Diagnosis not present

## 2017-09-05 DIAGNOSIS — Z823 Family history of stroke: Secondary | ICD-10-CM | POA: Insufficient documentation

## 2017-09-05 NOTE — Progress Notes (Signed)
EMG/NCV performed today right lower extremity. Indication was right lower extremity weakness, monoplegia.  Incomplete study secondary to right lower extremity edema as well as difficulties with mobility related to her recent stroke.  She does have evidence of denervation in the right rectus femoris right vastus medialis but not in the right abductor longus or right hamstring short head.  No denervation in the TA or in the posterior tibialis or the medial gastroc.  Nerve conduction studies were absent which may be related to her edema although diabetic peripheral neuropathy cannot be ruled out and she would need to be studied after edema is resolved.  Please see scanned report in media for full study patient will follow-up next month to discuss results.

## 2017-09-05 NOTE — Patient Instructions (Signed)
Will discuss results at next visit.

## 2017-09-13 DIAGNOSIS — I482 Chronic atrial fibrillation: Secondary | ICD-10-CM | POA: Diagnosis not present

## 2017-09-13 DIAGNOSIS — M069 Rheumatoid arthritis, unspecified: Secondary | ICD-10-CM | POA: Diagnosis not present

## 2017-09-13 DIAGNOSIS — K219 Gastro-esophageal reflux disease without esophagitis: Secondary | ICD-10-CM | POA: Diagnosis not present

## 2017-09-13 DIAGNOSIS — E119 Type 2 diabetes mellitus without complications: Secondary | ICD-10-CM | POA: Diagnosis not present

## 2017-09-14 DIAGNOSIS — H40013 Open angle with borderline findings, low risk, bilateral: Secondary | ICD-10-CM | POA: Diagnosis not present

## 2017-09-14 DIAGNOSIS — D500 Iron deficiency anemia secondary to blood loss (chronic): Secondary | ICD-10-CM | POA: Diagnosis not present

## 2017-09-14 DIAGNOSIS — M2550 Pain in unspecified joint: Secondary | ICD-10-CM | POA: Diagnosis not present

## 2017-09-14 DIAGNOSIS — Z79899 Other long term (current) drug therapy: Secondary | ICD-10-CM | POA: Diagnosis not present

## 2017-09-14 DIAGNOSIS — H35033 Hypertensive retinopathy, bilateral: Secondary | ICD-10-CM | POA: Diagnosis not present

## 2017-09-14 DIAGNOSIS — M0589 Other rheumatoid arthritis with rheumatoid factor of multiple sites: Secondary | ICD-10-CM | POA: Diagnosis not present

## 2017-09-14 DIAGNOSIS — E663 Overweight: Secondary | ICD-10-CM | POA: Diagnosis not present

## 2017-09-14 DIAGNOSIS — Z8673 Personal history of transient ischemic attack (TIA), and cerebral infarction without residual deficits: Secondary | ICD-10-CM | POA: Diagnosis not present

## 2017-09-14 DIAGNOSIS — M5030 Other cervical disc degeneration, unspecified cervical region: Secondary | ICD-10-CM | POA: Diagnosis not present

## 2017-09-14 DIAGNOSIS — H353130 Nonexudative age-related macular degeneration, bilateral, stage unspecified: Secondary | ICD-10-CM | POA: Diagnosis not present

## 2017-09-14 DIAGNOSIS — R600 Localized edema: Secondary | ICD-10-CM | POA: Diagnosis not present

## 2017-09-14 DIAGNOSIS — H53462 Homonymous bilateral field defects, left side: Secondary | ICD-10-CM | POA: Diagnosis not present

## 2017-09-14 DIAGNOSIS — M25561 Pain in right knee: Secondary | ICD-10-CM | POA: Diagnosis not present

## 2017-09-14 DIAGNOSIS — I63411 Cerebral infarction due to embolism of right middle cerebral artery: Secondary | ICD-10-CM | POA: Diagnosis not present

## 2017-09-14 DIAGNOSIS — Z6827 Body mass index (BMI) 27.0-27.9, adult: Secondary | ICD-10-CM | POA: Diagnosis not present

## 2017-09-14 DIAGNOSIS — M25532 Pain in left wrist: Secondary | ICD-10-CM | POA: Diagnosis not present

## 2017-09-14 DIAGNOSIS — Z7952 Long term (current) use of systemic steroids: Secondary | ICD-10-CM | POA: Diagnosis not present

## 2017-09-21 DIAGNOSIS — K219 Gastro-esophageal reflux disease without esophagitis: Secondary | ICD-10-CM | POA: Diagnosis not present

## 2017-09-21 DIAGNOSIS — M069 Rheumatoid arthritis, unspecified: Secondary | ICD-10-CM | POA: Diagnosis not present

## 2017-09-21 DIAGNOSIS — E785 Hyperlipidemia, unspecified: Secondary | ICD-10-CM | POA: Diagnosis not present

## 2017-09-21 DIAGNOSIS — I63431 Cerebral infarction due to embolism of right posterior cerebral artery: Secondary | ICD-10-CM | POA: Diagnosis not present

## 2017-09-25 ENCOUNTER — Telehealth: Payer: Self-pay | Admitting: Adult Health

## 2017-09-25 NOTE — Telephone Encounter (Signed)
Fields, Sheena Stephenie [place], NP  Cc: Marval Regal, RN        The pt is in a rehab facility. I called and lvm for [name] at the facility on the following days 08/17/17, 08/24/17 and 09/05/17. I have not heard back from [place]. Pt will now be listed as unable to contact. If pt calls back I will be more than happy to get them schedule for sleep consult.

## 2017-10-04 ENCOUNTER — Emergency Department (HOSPITAL_COMMUNITY): Payer: Medicare Other

## 2017-10-04 ENCOUNTER — Other Ambulatory Visit: Payer: Self-pay

## 2017-10-04 ENCOUNTER — Encounter (HOSPITAL_COMMUNITY): Payer: Self-pay | Admitting: Emergency Medicine

## 2017-10-04 ENCOUNTER — Inpatient Hospital Stay (HOSPITAL_COMMUNITY)
Admission: EM | Admit: 2017-10-04 | Discharge: 2017-10-10 | DRG: 871 | Disposition: A | Discharge status: Home / Self Care | Payer: Medicare Other | Attending: Internal Medicine | Admitting: Internal Medicine

## 2017-10-04 DIAGNOSIS — I5033 Acute on chronic diastolic (congestive) heart failure: Secondary | ICD-10-CM | POA: Diagnosis present

## 2017-10-04 DIAGNOSIS — I509 Heart failure, unspecified: Secondary | ICD-10-CM | POA: Diagnosis not present

## 2017-10-04 DIAGNOSIS — E119 Type 2 diabetes mellitus without complications: Secondary | ICD-10-CM | POA: Diagnosis not present

## 2017-10-04 DIAGNOSIS — F419 Anxiety disorder, unspecified: Secondary | ICD-10-CM | POA: Diagnosis present

## 2017-10-04 DIAGNOSIS — I10 Essential (primary) hypertension: Secondary | ICD-10-CM | POA: Diagnosis not present

## 2017-10-04 DIAGNOSIS — I5032 Chronic diastolic (congestive) heart failure: Secondary | ICD-10-CM | POA: Diagnosis present

## 2017-10-04 DIAGNOSIS — E1151 Type 2 diabetes mellitus with diabetic peripheral angiopathy without gangrene: Secondary | ICD-10-CM | POA: Diagnosis present

## 2017-10-04 DIAGNOSIS — J189 Pneumonia, unspecified organism: Secondary | ICD-10-CM | POA: Diagnosis present

## 2017-10-04 DIAGNOSIS — I130 Hypertensive heart and chronic kidney disease with heart failure and stage 1 through stage 4 chronic kidney disease, or unspecified chronic kidney disease: Secondary | ICD-10-CM | POA: Diagnosis present

## 2017-10-04 DIAGNOSIS — Z7984 Long term (current) use of oral hypoglycemic drugs: Secondary | ICD-10-CM

## 2017-10-04 DIAGNOSIS — Z7952 Long term (current) use of systemic steroids: Secondary | ICD-10-CM

## 2017-10-04 DIAGNOSIS — E876 Hypokalemia: Secondary | ICD-10-CM | POA: Diagnosis present

## 2017-10-04 DIAGNOSIS — E1122 Type 2 diabetes mellitus with diabetic chronic kidney disease: Secondary | ICD-10-CM | POA: Diagnosis present

## 2017-10-04 DIAGNOSIS — J181 Lobar pneumonia, unspecified organism: Secondary | ICD-10-CM | POA: Diagnosis not present

## 2017-10-04 DIAGNOSIS — I4891 Unspecified atrial fibrillation: Secondary | ICD-10-CM | POA: Diagnosis present

## 2017-10-04 DIAGNOSIS — Z794 Long term (current) use of insulin: Secondary | ICD-10-CM | POA: Diagnosis not present

## 2017-10-04 DIAGNOSIS — R41840 Attention and concentration deficit: Secondary | ICD-10-CM | POA: Diagnosis not present

## 2017-10-04 DIAGNOSIS — E785 Hyperlipidemia, unspecified: Secondary | ICD-10-CM | POA: Diagnosis present

## 2017-10-04 DIAGNOSIS — N3090 Cystitis, unspecified without hematuria: Secondary | ICD-10-CM

## 2017-10-04 DIAGNOSIS — I63431 Cerebral infarction due to embolism of right posterior cerebral artery: Secondary | ICD-10-CM | POA: Diagnosis not present

## 2017-10-04 DIAGNOSIS — R0602 Shortness of breath: Secondary | ICD-10-CM | POA: Diagnosis not present

## 2017-10-04 DIAGNOSIS — D631 Anemia in chronic kidney disease: Secondary | ICD-10-CM | POA: Diagnosis present

## 2017-10-04 DIAGNOSIS — M6281 Muscle weakness (generalized): Secondary | ICD-10-CM | POA: Diagnosis not present

## 2017-10-04 DIAGNOSIS — R6520 Severe sepsis without septic shock: Secondary | ICD-10-CM | POA: Diagnosis present

## 2017-10-04 DIAGNOSIS — Z96643 Presence of artificial hip joint, bilateral: Secondary | ICD-10-CM | POA: Diagnosis present

## 2017-10-04 DIAGNOSIS — R509 Fever, unspecified: Secondary | ICD-10-CM

## 2017-10-04 DIAGNOSIS — I340 Nonrheumatic mitral (valve) insufficiency: Secondary | ICD-10-CM | POA: Diagnosis not present

## 2017-10-04 DIAGNOSIS — M05752 Rheumatoid arthritis with rheumatoid factor of left hip without organ or systems involvement: Secondary | ICD-10-CM | POA: Diagnosis not present

## 2017-10-04 DIAGNOSIS — I69915 Cognitive social or emotional deficit following unspecified cerebrovascular disease: Secondary | ICD-10-CM | POA: Diagnosis not present

## 2017-10-04 DIAGNOSIS — R270 Ataxia, unspecified: Secondary | ICD-10-CM | POA: Diagnosis not present

## 2017-10-04 DIAGNOSIS — I482 Chronic atrial fibrillation, unspecified: Secondary | ICD-10-CM | POA: Diagnosis present

## 2017-10-04 DIAGNOSIS — M170 Bilateral primary osteoarthritis of knee: Secondary | ICD-10-CM | POA: Diagnosis present

## 2017-10-04 DIAGNOSIS — R0989 Other specified symptoms and signs involving the circulatory and respiratory systems: Secondary | ICD-10-CM

## 2017-10-04 DIAGNOSIS — E1165 Type 2 diabetes mellitus with hyperglycemia: Secondary | ICD-10-CM | POA: Diagnosis present

## 2017-10-04 DIAGNOSIS — N183 Chronic kidney disease, stage 3 (moderate): Secondary | ICD-10-CM | POA: Diagnosis present

## 2017-10-04 DIAGNOSIS — I63411 Cerebral infarction due to embolism of right middle cerebral artery: Secondary | ICD-10-CM | POA: Diagnosis not present

## 2017-10-04 DIAGNOSIS — R278 Other lack of coordination: Secondary | ICD-10-CM | POA: Diagnosis not present

## 2017-10-04 DIAGNOSIS — K219 Gastro-esophageal reflux disease without esophagitis: Secondary | ICD-10-CM | POA: Diagnosis not present

## 2017-10-04 DIAGNOSIS — G5600 Carpal tunnel syndrome, unspecified upper limb: Secondary | ICD-10-CM | POA: Diagnosis not present

## 2017-10-04 DIAGNOSIS — M069 Rheumatoid arthritis, unspecified: Secondary | ICD-10-CM | POA: Diagnosis present

## 2017-10-04 DIAGNOSIS — R41841 Cognitive communication deficit: Secondary | ICD-10-CM | POA: Diagnosis not present

## 2017-10-04 DIAGNOSIS — R4182 Altered mental status, unspecified: Secondary | ICD-10-CM | POA: Diagnosis not present

## 2017-10-04 DIAGNOSIS — Y95 Nosocomial condition: Secondary | ICD-10-CM | POA: Diagnosis present

## 2017-10-04 DIAGNOSIS — Z8719 Personal history of other diseases of the digestive system: Secondary | ICD-10-CM | POA: Diagnosis not present

## 2017-10-04 DIAGNOSIS — R300 Dysuria: Secondary | ICD-10-CM | POA: Diagnosis not present

## 2017-10-04 DIAGNOSIS — I471 Supraventricular tachycardia: Secondary | ICD-10-CM | POA: Diagnosis present

## 2017-10-04 DIAGNOSIS — Z8673 Personal history of transient ischemic attack (TIA), and cerebral infarction without residual deficits: Secondary | ICD-10-CM

## 2017-10-04 DIAGNOSIS — N390 Urinary tract infection, site not specified: Secondary | ICD-10-CM | POA: Diagnosis present

## 2017-10-04 DIAGNOSIS — Z7901 Long term (current) use of anticoagulants: Secondary | ICD-10-CM | POA: Diagnosis not present

## 2017-10-04 DIAGNOSIS — K6812 Psoas muscle abscess: Secondary | ICD-10-CM | POA: Diagnosis present

## 2017-10-04 DIAGNOSIS — R296 Repeated falls: Secondary | ICD-10-CM | POA: Diagnosis not present

## 2017-10-04 DIAGNOSIS — A419 Sepsis, unspecified organism: Principal | ICD-10-CM | POA: Diagnosis present

## 2017-10-04 DIAGNOSIS — Z79899 Other long term (current) drug therapy: Secondary | ICD-10-CM

## 2017-10-04 DIAGNOSIS — R05 Cough: Secondary | ICD-10-CM | POA: Diagnosis not present

## 2017-10-04 DIAGNOSIS — E118 Type 2 diabetes mellitus with unspecified complications: Secondary | ICD-10-CM | POA: Diagnosis not present

## 2017-10-04 DIAGNOSIS — E041 Nontoxic single thyroid nodule: Secondary | ICD-10-CM | POA: Diagnosis not present

## 2017-10-04 LAB — CBC
HCT: 39.2 % (ref 36.0–46.0)
HEMOGLOBIN: 12.3 g/dL (ref 12.0–15.0)
MCH: 25.8 pg — ABNORMAL LOW (ref 26.0–34.0)
MCHC: 31.4 g/dL (ref 30.0–36.0)
MCV: 82.2 fL (ref 78.0–100.0)
Platelets: 198 10*3/uL (ref 150–400)
RBC: 4.77 MIL/uL (ref 3.87–5.11)
RDW: 16.6 % — AB (ref 11.5–15.5)
WBC: 6.8 10*3/uL (ref 4.0–10.5)

## 2017-10-04 LAB — BASIC METABOLIC PANEL
ANION GAP: 14 (ref 5–15)
BUN: 23 mg/dL (ref 8–23)
CO2: 21 mmol/L — ABNORMAL LOW (ref 22–32)
CREATININE: 0.98 mg/dL (ref 0.44–1.00)
Calcium: 8.6 mg/dL — ABNORMAL LOW (ref 8.9–10.3)
Chloride: 97 mmol/L — ABNORMAL LOW (ref 98–111)
GFR calc Af Amer: >60 mL/min (ref >60)
GFR, EST NON AFRICAN AMERICAN: 56 mL/min — AB (ref >60)
Glucose, Bld: 187 mg/dL — ABNORMAL HIGH (ref 70–99)
POTASSIUM: 3.7 mmol/L (ref 3.5–5.1)
Sodium: 132 mmol/L — ABNORMAL LOW (ref 135–145)

## 2017-10-04 LAB — URINALYSIS, ROUTINE W REFLEX MICROSCOPIC
BILIRUBIN URINE: NEGATIVE
Glucose, UA: NEGATIVE mg/dL
KETONES UR: NEGATIVE mg/dL
NITRITE: POSITIVE — AB
PROTEIN: 30 mg/dL — AB
Specific Gravity, Urine: 1.025 (ref 1.005–1.030)
pH: 5 (ref 5.0–8.0)

## 2017-10-04 LAB — PROCALCITONIN: Procalcitonin: 5.86 ng/mL

## 2017-10-04 LAB — URINALYSIS, MICROSCOPIC (REFLEX): Squamous Epithelial / LPF: NONE SEEN (ref 0–5)

## 2017-10-04 LAB — GLUCOSE, CAPILLARY
Glucose-Capillary: 279 mg/dL — ABNORMAL HIGH (ref 70–99)
Glucose-Capillary: 370 mg/dL — ABNORMAL HIGH (ref 70–99)

## 2017-10-04 LAB — LACTIC ACID, PLASMA
LACTIC ACID, VENOUS: 1.7 mmol/L (ref 0.5–1.9)
Lactic Acid, Venous: 2.2 mmol/L (ref 0.5–1.9)
Lactic Acid, Venous: 1.7 mmol/L (ref 0.5–1.9)
Lactic Acid, Venous: 2.3 mmol/L (ref 0.5–1.9)

## 2017-10-04 LAB — MAGNESIUM: MAGNESIUM: 1.4 mg/dL — AB (ref 1.7–2.4)

## 2017-10-04 LAB — TROPONIN I

## 2017-10-04 LAB — I-STAT CG4 LACTIC ACID, ED: Lactic Acid, Venous: 3 mmol/L (ref 0.5–1.9)

## 2017-10-04 LAB — BRAIN NATRIURETIC PEPTIDE: B NATRIURETIC PEPTIDE 5: 216 pg/mL — AB (ref 0.0–100.0)

## 2017-10-04 MED ORDER — MAGNESIUM SULFATE 2 GM/50ML IV SOLN
2.0000 g | Freq: Once | INTRAVENOUS | Status: AC
  Administered 2017-10-04: 2 g via INTRAVENOUS
  Filled 2017-10-04: qty 50

## 2017-10-04 MED ORDER — ACETAMINOPHEN 325 MG PO TABS
650.0000 mg | ORAL_TABLET | Freq: Four times a day (QID) | ORAL | Status: DC | PRN
  Administered 2017-10-05 – 2017-10-07 (×3): 650 mg via ORAL
  Filled 2017-10-04 (×3): qty 2

## 2017-10-04 MED ORDER — LEVALBUTEROL HCL 0.63 MG/3ML IN NEBU
0.6300 mg | INHALATION_SOLUTION | Freq: Four times a day (QID) | RESPIRATORY_TRACT | Status: DC
Start: 2017-10-04 — End: 2017-10-06
  Administered 2017-10-04 – 2017-10-06 (×7): 0.63 mg via RESPIRATORY_TRACT
  Filled 2017-10-04 (×7): qty 3

## 2017-10-04 MED ORDER — QUETIAPINE FUMARATE 25 MG PO TABS
25.0000 mg | ORAL_TABLET | Freq: Every day | ORAL | Status: DC
  Administered 2017-10-04 – 2017-10-09 (×6): 25 mg via ORAL
  Filled 2017-10-04 (×6): qty 1

## 2017-10-04 MED ORDER — LEVALBUTEROL HCL 0.63 MG/3ML IN NEBU: 0.6300 mg | INHALATION_SOLUTION | Freq: Four times a day (QID) | RESPIRATORY_TRACT | Status: DC | PRN

## 2017-10-04 MED ORDER — ONDANSETRON HCL 4 MG PO TABS: 4.0000 mg | ORAL_TABLET | Freq: Four times a day (QID) | ORAL | Status: DC | PRN

## 2017-10-04 MED ORDER — IPRATROPIUM BROMIDE 0.02 % IN SOLN
0.5000 mg | Freq: Four times a day (QID) | RESPIRATORY_TRACT | Status: DC
  Administered 2017-10-04 – 2017-10-06 (×7): 0.5 mg via RESPIRATORY_TRACT
  Filled 2017-10-04 (×7): qty 2.5

## 2017-10-04 MED ORDER — INSULIN ASPART 100 UNIT/ML ~~LOC~~ SOLN
0.0000 [IU] | Freq: Every day | SUBCUTANEOUS | Status: DC
  Administered 2017-10-04: 5 [IU] via SUBCUTANEOUS
  Administered 2017-10-05: 2 [IU] via SUBCUTANEOUS
  Administered 2017-10-06: 3 [IU] via SUBCUTANEOUS
  Administered 2017-10-07 – 2017-10-08 (×2): 4 [IU] via SUBCUTANEOUS
  Administered 2017-10-09: 2 [IU] via SUBCUTANEOUS

## 2017-10-04 MED ORDER — VANCOMYCIN HCL IN DEXTROSE 750-5 MG/150ML-% IV SOLN
750.0000 mg | Freq: Two times a day (BID) | INTRAVENOUS | Status: DC
  Administered 2017-10-05: 750 mg via INTRAVENOUS
  Filled 2017-10-04: qty 150

## 2017-10-04 MED ORDER — SODIUM CHLORIDE 0.9 % IV SOLN
2.0000 g | Freq: Once | INTRAVENOUS | Status: AC
  Administered 2017-10-04: 2 g via INTRAVENOUS
  Filled 2017-10-04 (×2): qty 2

## 2017-10-04 MED ORDER — BUPROPION HCL ER (SR) 150 MG PO TB12
150.0000 mg | ORAL_TABLET | Freq: Two times a day (BID) | ORAL | Status: DC
  Administered 2017-10-04 – 2017-10-10 (×12): 150 mg via ORAL
  Filled 2017-10-04 (×12): qty 1

## 2017-10-04 MED ORDER — SODIUM CHLORIDE 0.9 % IV SOLN
1.0000 g | Freq: Two times a day (BID) | INTRAVENOUS | Status: DC
  Administered 2017-10-05 – 2017-10-06 (×3): 1 g via INTRAVENOUS
  Filled 2017-10-04 (×10): qty 1

## 2017-10-04 MED ORDER — GUAIFENESIN ER 600 MG PO TB12
600.0000 mg | ORAL_TABLET | Freq: Two times a day (BID) | ORAL | Status: DC
  Administered 2017-10-04 – 2017-10-05 (×3): 600 mg via ORAL
  Filled 2017-10-04 (×3): qty 1

## 2017-10-04 MED ORDER — SODIUM CHLORIDE 0.9 % IV BOLUS
500.0000 mL | Freq: Once | INTRAVENOUS | Status: AC
  Administered 2017-10-04: 500 mL via INTRAVENOUS

## 2017-10-04 MED ORDER — DILTIAZEM HCL-DEXTROSE 100-5 MG/100ML-% IV SOLN (PREMIX)
5.0000 mg/h | INTRAVENOUS | Status: DC
  Administered 2017-10-04: 5 mg/h via INTRAVENOUS
  Administered 2017-10-05: 15 mg/h via INTRAVENOUS
  Administered 2017-10-05: 10 mg/h via INTRAVENOUS
  Administered 2017-10-06 (×4): 15 mg/h via INTRAVENOUS
  Administered 2017-10-07 (×2): 10 mg/h via INTRAVENOUS
  Administered 2017-10-08: 7.5 mg/h via INTRAVENOUS
  Filled 2017-10-04 (×10): qty 100

## 2017-10-04 MED ORDER — RIVAROXABAN 15 MG PO TABS
15.0000 mg | ORAL_TABLET | Freq: Every day | ORAL | Status: DC
  Administered 2017-10-04 – 2017-10-05 (×2): 15 mg via ORAL
  Filled 2017-10-04 (×2): qty 1

## 2017-10-04 MED ORDER — ASPIRIN 325 MG PO TABS
325.0000 mg | ORAL_TABLET | Freq: Once | ORAL | Status: AC
  Administered 2017-10-04: 325 mg via ORAL
  Filled 2017-10-04: qty 1

## 2017-10-04 MED ORDER — IBUPROFEN 800 MG PO TABS
800.0000 mg | ORAL_TABLET | Freq: Once | ORAL | Status: AC
  Administered 2017-10-04: 800 mg via ORAL
  Filled 2017-10-04: qty 1

## 2017-10-04 MED ORDER — SODIUM CHLORIDE 0.9 % IV SOLN
1.0000 g | Freq: Two times a day (BID) | INTRAVENOUS | Status: DC
  Filled 2017-10-04 (×2): qty 1

## 2017-10-04 MED ORDER — SODIUM CHLORIDE 0.9 % IV SOLN
500.0000 mg | INTRAVENOUS | Status: DC
  Administered 2017-10-04: 500 mg via INTRAVENOUS
  Filled 2017-10-04 (×2): qty 500

## 2017-10-04 MED ORDER — PREDNISONE 10 MG PO TABS
5.0000 mg | ORAL_TABLET | Freq: Every day | ORAL | Status: DC
  Administered 2017-10-05: 5 mg via ORAL
  Filled 2017-10-04: qty 1

## 2017-10-04 MED ORDER — SODIUM CHLORIDE 0.9 % IV BOLUS (SEPSIS)
250.0000 mL | Freq: Once | INTRAVENOUS | Status: AC
  Administered 2017-10-04: 250 mL via INTRAVENOUS

## 2017-10-04 MED ORDER — VANCOMYCIN HCL 10 G IV SOLR
1500.0000 mg | Freq: Once | INTRAVENOUS | Status: AC
  Administered 2017-10-04: 1500 mg via INTRAVENOUS
  Filled 2017-10-04: qty 1500

## 2017-10-04 MED ORDER — SODIUM CHLORIDE 0.9 % IV BOLUS (SEPSIS)
1000.0000 mL | Freq: Once | INTRAVENOUS | Status: AC
  Administered 2017-10-04: 1000 mL via INTRAVENOUS

## 2017-10-04 MED ORDER — ACETAMINOPHEN 500 MG PO TABS
1000.0000 mg | ORAL_TABLET | Freq: Once | ORAL | Status: AC
  Administered 2017-10-04: 1000 mg via ORAL
  Filled 2017-10-04: qty 2

## 2017-10-04 MED ORDER — GABAPENTIN 300 MG PO CAPS
300.0000 mg | ORAL_CAPSULE | Freq: Three times a day (TID) | ORAL | Status: DC
  Administered 2017-10-04 – 2017-10-10 (×18): 300 mg via ORAL
  Filled 2017-10-04 (×18): qty 1

## 2017-10-04 MED ORDER — DILTIAZEM LOAD VIA INFUSION
20.0000 mg | Freq: Once | INTRAVENOUS | Status: AC
  Administered 2017-10-04: 20 mg via INTRAVENOUS
  Filled 2017-10-04: qty 20

## 2017-10-04 MED ORDER — VANCOMYCIN HCL IN DEXTROSE 1-5 GM/200ML-% IV SOLN: 1000.0000 mg | Freq: Once | INTRAVENOUS | Status: DC

## 2017-10-04 MED ORDER — METOPROLOL TARTRATE 5 MG/5ML IV SOLN
5.0000 mg | Freq: Once | INTRAVENOUS | Status: AC
  Administered 2017-10-04: 5 mg via INTRAVENOUS
  Filled 2017-10-04: qty 5

## 2017-10-04 MED ORDER — PANTOPRAZOLE SODIUM 40 MG PO TBEC
40.0000 mg | DELAYED_RELEASE_TABLET | Freq: Every day | ORAL | Status: DC
  Administered 2017-10-05 – 2017-10-10 (×6): 40 mg via ORAL
  Filled 2017-10-04 (×6): qty 1

## 2017-10-04 MED ORDER — FUROSEMIDE 10 MG/ML IJ SOLN
60.0000 mg | Freq: Once | INTRAMUSCULAR | Status: AC
  Administered 2017-10-04: 60 mg via INTRAVENOUS
  Filled 2017-10-04: qty 6

## 2017-10-04 MED ORDER — ONDANSETRON HCL 4 MG/2ML IJ SOLN: 4.0000 mg | Freq: Four times a day (QID) | INTRAMUSCULAR | Status: DC | PRN

## 2017-10-04 MED ORDER — SODIUM CHLORIDE 0.9 % IV SOLN
1.0000 g | Freq: Once | INTRAVENOUS | Status: AC
  Administered 2017-10-04: 1 g via INTRAVENOUS
  Filled 2017-10-04: qty 10

## 2017-10-04 MED ORDER — INSULIN ASPART 100 UNIT/ML ~~LOC~~ SOLN
0.0000 [IU] | Freq: Three times a day (TID) | SUBCUTANEOUS | Status: DC
  Administered 2017-10-04 – 2017-10-05 (×2): 8 [IU] via SUBCUTANEOUS
  Administered 2017-10-05: 3 [IU] via SUBCUTANEOUS
  Administered 2017-10-06 – 2017-10-07 (×2): 5 [IU] via SUBCUTANEOUS
  Administered 2017-10-07: 3 [IU] via SUBCUTANEOUS
  Administered 2017-10-07: 5 [IU] via SUBCUTANEOUS
  Administered 2017-10-08 (×2): 8 [IU] via SUBCUTANEOUS
  Administered 2017-10-08: 15 [IU] via SUBCUTANEOUS
  Administered 2017-10-09: 5 [IU] via SUBCUTANEOUS
  Administered 2017-10-09: 11 [IU] via SUBCUTANEOUS
  Administered 2017-10-09 – 2017-10-10 (×2): 2 [IU] via SUBCUTANEOUS
  Administered 2017-10-10: 3 [IU] via SUBCUTANEOUS
  Administered 2017-10-10: 8 [IU] via SUBCUTANEOUS

## 2017-10-04 MED ORDER — SODIUM CHLORIDE 0.9 % IV SOLN
INTRAVENOUS | Status: DC
  Administered 2017-10-04 – 2017-10-05 (×2): via INTRAVENOUS

## 2017-10-04 MED ORDER — ROSUVASTATIN CALCIUM 20 MG PO TABS
20.0000 mg | ORAL_TABLET | Freq: Every evening | ORAL | Status: DC
  Administered 2017-10-04 – 2017-10-09 (×6): 20 mg via ORAL
  Filled 2017-10-04 (×6): qty 1

## 2017-10-04 MED ORDER — ACETAMINOPHEN 650 MG RE SUPP: 650.0000 mg | Freq: Four times a day (QID) | RECTAL | Status: DC | PRN

## 2017-10-04 NOTE — ED Notes (Signed)
Patient's clothing had been cut off by EMS

## 2017-10-04 NOTE — ED Notes (Addendum)
Date and time results received: 10/04/17 1210  Test: I-STAT Lactic Acid Critical Value: 3.00  Name of Provider Notified: Dr. Gilford Raid  Orders Received? Or Actions Taken?: None at this time

## 2017-10-04 NOTE — ED Triage Notes (Signed)
Pt from [facility]. Called out for SOB. Pt HR was in 180s. Pt reports fever x 2-3 days and productive cough. Pt given 6mg , 12mg  Adenosine and 125 mg solu-medrol PTA. Per EMS, pt in Afib.

## 2017-10-04 NOTE — ED Notes (Signed)
Phlebotomy at bedside.

## 2017-10-04 NOTE — Progress Notes (Addendum)
Pharmacy Antibiotic Note  [name] is a 72 y.o. female admitted on 10/04/2017 with pneumonia.  Pharmacy has been consulted for vancomycin and cefepime  dosing.  Plan: vancomycin 1.5g IV x1 dose; then start  vancomycin 750mg  IV every 12 hours.  Goal vancomycin trough range: 15-20 mcg/mL. Start cefepime 1g IV q12h Pharmacy will continue to monitor cultures, renal function and patient progress.     Weight: 164 lb (74.4 kg)  Temp (24hrs), Avg:101.3 F (38.5 C), Min:99.3 F (37.4 C), Max:103.1 F (39.5 C)  Recent Labs  Lab 10/04/17 1045 10/04/17 1150  WBC 6.8  --   CREATININE 0.98  --   LATICACIDVEN  --  3.00*    Estimated Creatinine Clearance: 54.6 mL/min (by C-G formula based on SCr of 0.98 mg/dL).    No Known Allergies  Antimicrobials this admission: 7/3 cefepime >>  7/3 vancomycin >> 7/3 azithromycin >> 7/3 ceftriaxone>> 7/3  Dose adjustments this admission: n/a  Microbiology results: 7/3 BCx2: in progress 7/3 UCx: in progress   Thank you for allowing pharmacy to be a part of this patient's care.  Despina Pole, Pharm. D. Clinical Pharmacist 10/04/2017 3:31 PM

## 2017-10-04 NOTE — ED Notes (Signed)
X-ray at bedside

## 2017-10-04 NOTE — ED Provider Notes (Addendum)
[facility] Provider Note   CSN: [id number] Arrival date & time: 10/04/17  1018     History   Chief Complaint Chief Complaint  Patient presents with  . Tachycardia    HPI Lyrical [name] is a 72 y.o. female.  Pt presents to the ED today with sob.  The pt has a hx of a.fib and is in a.fib.  EMS was concerned that it was SVT and gave her 6mg , and 12 mg of adenosine which did not help.  She was also given solumedrol 125 mg en route.  The pt denies any pain.  Pt is on Xarelto for a.fib.   CHA2DS2/VAS Stroke Risk Points  Current as of 6 minutes ago     7 >= 2 Points: High Risk  1 - 1.99 Points: Medium Risk  0 Points: Low Risk    This is the only CHA2DS2/VAS Stroke Risk Points available for the past  year.:  Last Change: N/A     Details    This score determines the patient's risk of having a stroke if the  patient has atrial fibrillation.       Points Metrics  1 Has Congestive Heart Failure:  Yes    Current as of 6 minutes ago  0 Has Vascular Disease:  No    Current as of 6 minutes ago  1 Has Hypertension:  Yes    Current as of 6 minutes ago  1 Age:  79    Current as of 6 minutes ago  1 Has Diabetes:  Yes    Current as of 6 minutes ago  2 Had Stroke:  Yes  Had TIA:  No  Had thromboembolism:  Yes    Current as of 6 minutes ago  1 Female:  Yes    Current as of 6 minutes ago                Past Medical History:  Diagnosis Date  . Abnormal LFTs 07/24/2017  . Actinic keratosis   . Anemia    hx of  . Anemia of chronic renal failure, stage 3 (moderate) ([place]) 07/17/2015  . Anticoagulant causing adverse effect in therapeutic use 07/17/2015  . Anxiety   . Arthritis    rheumatoid  . Atrial fibrillation ([place])    a. persistent, she remains on Xarelto  . Carpal tunnel syndrome   . Chronic diastolic (congestive) heart failure ([place])    a. 04/2016: echo showing a preserved EF of 55-60% with mild MR. LA and RA midly dilated.   . Edema    left leg at  ankle resolved now  . Gastroesophageal reflux disease   . Hepatitis 1970   not sure what kind  . Hiatal hernia   . Hyperlipidemia   . Hypertension   . Iron deficiency anemia due to chronic blood loss 07/17/2015  . Jaundice    age 58  . Peripheral vascular disease ([place]) yrs ago   DVT left lower leg questionale told by 2 drs i had no clot, 1 md said i did  . Rheumatoid arthritis(714.0)   . Right knee pain   . Stroke ([place])   . Type II diabetes mellitus ([place])    type2    Patient Active Problem List   Diagnosis Date Noted  . Sepsis ([place]) 10/04/2017  . Hallucinations 07/25/2017  . Abnormal LFTs 07/24/2017  . Acute on chronic diastolic CHF (congestive heart failure) ([place])   . Elevated LFTs   . AKI (  acute kidney injury) ([place])   . Elevated troponin   . Stroke, acute, embolic ([place]) 90/30/0923  . Mitral regurgitation 06/17/2017  . Acute CVA (cerebrovascular accident) ([place])   . Thyroid nodule 06/15/2017  . Atrial fibrillation with RVR ([place]) 06/14/2017  . CVA (cerebral vascular accident) ([place]) 06/14/2017  . Type II diabetes mellitus ([place])   . Peripheral vascular disease ([place])   . Hyperlipidemia   . Hiatal hernia   . Edema   . Chronic diastolic (congestive) heart failure ([place])   . Carpal tunnel syndrome   . Atrial fibrillation ([place])   . Anemia   . Actinic keratosis   . Intractable nausea and vomiting 05/31/2016  . Nausea & vomiting 05/30/2016  . Abnormal chest x-ray 05/30/2016  . Diabetes mellitus with complication ([place])   . Influenza A 05/21/2016  . Chronic atrial fibrillation ([place]) 05/20/2016  . Acute CHF (congestive heart failure) ([place]) 05/20/2016  . History of GI bleed 03/29/2016  . Chronic anticoagulation 03/29/2016  . Symptomatic anemia 03/19/2016  . Spinal stenosis of lumbar region 02/10/2016  . Varicose veins of left leg with edema 09/15/2015  . Preop cardiovascular exam 08/25/2015  . Iron deficiency anemia due to chronic blood loss 07/17/2015  . Anemia of chronic  renal failure, stage 3 (moderate) ([place]) 07/17/2015  . Anticoagulant causing adverse effect in therapeutic use 07/17/2015  . Symptomatic bradycardia 02/21/2015  . Bradycardia   . Acute deep vein thrombosis (DVT) of left lower extremity ([place]) 05/06/2014  . Abdominal wall mass of right lower quadrant 07/08/2013  . OA (osteoarthritis) of hip 04/03/2013  . Osteoarthritis of hip 01/18/2013  . Night sweats 09/06/2012  . Chest pain   . Anxiety and depression   . Gastroesophageal reflux disease   . Hepatitis   . Jaundice   . Hypertension 07/01/2010  . Hypercholesterolemia 07/01/2010  . DM2 (diabetes mellitus, type 2) ([place]) 07/01/2010  . Rheumatoid arthritis ([place]) 07/01/2010  . Sinus tachycardia 07/01/2010  . Chest pain, atypical 07/01/2010    Past Surgical History:  Procedure Laterality Date  . CATARACT EXTRACTION Bilateral   . COLONOSCOPY WITH PROPOFOL N/A 02/20/2015   Procedure: COLONOSCOPY WITH PROPOFOL;  Surgeon: Carol Ada, MD;  Location: [facility];  Service: Endoscopy;  Laterality: N/A;  . ENTEROSCOPY N/A 03/23/2016   Procedure: ENTEROSCOPY;  Surgeon: Carol Ada, MD;  Location: [facility];  Service: Endoscopy;  Laterality: N/A;  . ENTEROSCOPY N/A 06/17/2016   Procedure: ENTEROSCOPY;  Surgeon: Carol Ada, MD;  Location: [facility];  Service: Endoscopy;  Laterality: N/A;  . ESOPHAGOGASTRODUODENOSCOPY (EGD) WITH PROPOFOL N/A 02/20/2015   Procedure: ESOPHAGOGASTRODUODENOSCOPY (EGD) WITH PROPOFOL;  Surgeon: Carol Ada, MD;  Location: [facility];  Service: Endoscopy;  Laterality: N/A;  . GIVENS CAPSULE STUDY N/A 03/21/2016   Procedure: GIVENS CAPSULE STUDY;  Surgeon: Carol Ada, MD;  Location: [facility];  Service: Endoscopy;  Laterality: N/A;  . HAND SURGERY  1995 and 1996   artificial joints both hands  . JOINT REPLACEMENT    . LUMBAR LAMINECTOMY/DECOMPRESSION MICRODISCECTOMY N/A 02/10/2016   Procedure: MICROLUMBAR DECOMPRESSION L4-L5 AND L3- L4, AND EXCISION OF  SYNOVIAL CYST L4-L5;  Surgeon: Susa Day, MD;  Location: [facility];  Service: Orthopedics;  Laterality: N/A;  . TOTAL HIP ARTHROPLASTY Left 2009  . TOTAL HIP ARTHROPLASTY Right 04/03/2013   Procedure: RIGHT TOTAL HIP ARTHROPLASTY ANTERIOR APPROACH;  Surgeon: Gearlean Alf, MD;  Location: [facility];  Service: Orthopedics;  Laterality: Right;     OB History   None  Home Medications    Prior to Admission medications   Medication Sig Start Date End Date Taking? Authorizing Provider  Abatacept (ORENCIA CLICKJECT) 062 MG/ML SOAJ Inject 125 mg into the skin every Thursday.     [provider]  buPROPion (WELLBUTRIN SR) 150 MG 12 hr tablet Take 150 mg by mouth 2 (two) times daily.      [provider]  Cholecalciferol (VITAMIN D3) 1000 units CAPS Take 1,000 Units by mouth every evening.     [provider]  diltiazem (CARDIZEM CD) 360 MG 24 hr capsule Take 1 capsule (360 mg total) by mouth daily for 2 days. 07/27/17 07/29/17  Bonnita Hollow, MD  esomeprazole (NEXIUM) 40 MG capsule Take 40 mg by mouth every morning.     [provider]  ferrous gluconate (FERGON) 324 MG tablet Take 1 tablet (324 mg total) by mouth 2 (two) times daily with a meal. 05/17/16   Debbe Odea, MD  furosemide (LASIX) 40 MG tablet Take 40 mg by mouth.    [provider]  gabapentin (NEURONTIN) 100 MG capsule Take 1 capsule (100 mg total) by mouth at bedtime. 07/04/17   Love, Ivan Anchors, PA-C  glimepiride (AMARYL) 2 MG tablet TAKE 1 TABLET DAILY BEFORE BREAKFAST 07/31/17   [provider]  leflunomide (ARAVA) 10 MG tablet Take 10 mg by mouth daily.    [provider]  metFORMIN (GLUCOPHAGE-XR) 500 MG 24 hr tablet  07/31/17   [provider]  metoprolol tartrate (LOPRESSOR) 100 MG tablet Take 1 tablet (100 mg total) by mouth 2 (two) times daily for 2 days. 07/27/17 08/03/17  Bonnita Hollow, MD  metoprolol tartrate (LOPRESSOR) 50 MG tablet TAKE 2 TABLETS  (100 MG TOTAL) BY MOUTH 2 (TWO) TIMES DAILY. 08/22/17   Erlene Quan, PA-C  Multiple Vitamins-Minerals (PRESERVISION AREDS 2) CAPS Take 1 capsule by mouth 2 (two) times daily.    [provider]  nitroGLYCERIN (NITROSTAT) 0.4 MG SL tablet Place 1 tablet (0.4 mg total) under the tongue every 5 (five) minutes as needed for chest pain (x 3 doses). 11/28/16   Lelon Perla, MD  potassium chloride SA (K-DUR,KLOR-CON) 20 MEQ tablet Take 1 tablet (20 mEq total) by mouth daily. 07/05/17   Love, Ivan Anchors, PA-C  predniSONE (DELTASONE) 5 MG tablet Take 5 mg by mouth daily.    [provider]  QUEtiapine (SEROQUEL) 25 MG tablet Take 1 tablet (25 mg total) by mouth 3 (three) times daily as needed (delerium or aggitation). 07/27/17   Kathrene Alu, MD  Rivaroxaban (XARELTO) 15 MG TABS tablet Take 1 tablet (15 mg total) by mouth daily. 07/28/17   Kathrene Alu, MD  rosuvastatin (CRESTOR) 20 MG tablet Take 1 tablet (20 mg total) by mouth every evening. 08/03/17   Venancio Poisson, NP  vitamin B-12 1000 MCG tablet Take 1 tablet (1,000 mcg total) by mouth every evening. 07/05/17   Love, Ivan Anchors, PA-C  VOLTAREN 1 % GEL Apply 2 g topically at bedtime as needed for pain. Knees, calf 04/27/17   [provider]    Family History Family History  Problem Relation Age of Onset  . Pneumonia Mother 93  . Stroke Father 20  . Heart failure Sister   . Hypertension Sister   . Heart attack Neg Hx     Social History Social History   Tobacco Use  . Smoking status: Never Smoker  . Smokeless tobacco: Never Used  Substance Use Topics  .  Alcohol use: No    Alcohol/week: 0.0 oz  . Drug use: No     Allergies   Patient has no known allergies.   Review of Systems Review of Systems  Respiratory: Positive for shortness of breath.   Cardiovascular: Positive for palpitations.  All other systems reviewed and are negative.    Physical Exam Updated Vital Signs BP (!) 125/55   Pulse  (!) 146   Temp (!) 102.4 F (39.1 C)   Resp (!) 21   Wt 74.4 kg (164 lb)   SpO2 94%   BMI 25.69 kg/m   Physical Exam  Constitutional: She appears well-developed and well-nourished. She appears distressed.  HENT:  Head: Normocephalic and atraumatic.  Right Ear: External ear normal.  Left Ear: External ear normal.  Nose: Nose normal.  Mouth/Throat: Oropharynx is clear and moist.  Eyes: Pupils are equal, round, and reactive to light. Conjunctivae and EOM are normal.  Neck: Normal range of motion. Neck supple.  Cardiovascular: Normal heart sounds and intact distal pulses. An irregularly irregular rhythm present. Tachycardia present.  Pulmonary/Chest: Tachypnea noted. She has rales.  Abdominal: Soft. Bowel sounds are normal.  Musculoskeletal: Normal range of motion.  Neurological: She is alert.  Skin: Skin is warm. Capillary refill takes less than 2 seconds.  Psychiatric: She has a normal mood and affect. Her behavior is normal. Judgment and thought content normal.  Nursing note and vitals reviewed.    ED Treatments / Results  Labs (all labs ordered are listed, but only abnormal results are displayed) Labs Reviewed  BASIC METABOLIC PANEL - Abnormal; Notable for the following components:      Result Value   Sodium 132 (*)    Chloride 97 (*)    CO2 21 (*)    Glucose, Bld 187 (*)    Calcium 8.6 (*)    GFR calc non Af Amer 56 (*)    All other components within normal limits  MAGNESIUM - Abnormal; Notable for the following components:   Magnesium 1.4 (*)    All other components within normal limits  CBC - Abnormal; Notable for the following components:   MCH 25.8 (*)    RDW 16.6 (*)    All other components within normal limits  BRAIN NATRIURETIC PEPTIDE - Abnormal; Notable for the following components:   B Natriuretic Peptide 216.0 (*)    All other components within normal limits  URINALYSIS, ROUTINE W REFLEX MICROSCOPIC - Abnormal; Notable for the following components:    APPearance HAZY (*)    Hgb urine dipstick TRACE (*)    Protein, ur 30 (*)    Nitrite POSITIVE (*)    Leukocytes, UA SMALL (*)    All other components within normal limits  URINALYSIS, MICROSCOPIC (REFLEX) - Abnormal; Notable for the following components:   Bacteria, UA MANY (*)    All other components within normal limits  I-STAT CG4 LACTIC ACID, ED - Abnormal; Notable for the following components:   Lactic Acid, Venous 3.00 (*)    All other components within normal limits  CULTURE, BLOOD (ROUTINE X 2)  CULTURE, BLOOD (ROUTINE X 2)  URINE CULTURE  TROPONIN I  I-STAT CG4 LACTIC ACID, ED    EKG EKG Interpretation  Date/Time:  Wednesday October 04 2017 10:22:55 EDT Ventricular Rate:  214 PR Interval:    QRS Duration: 64 QT Interval:  245 QTC Calculation: 463 R Axis:   98 Text Interpretation:  Atrial fibrillation with rapid V-rate Right axis deviation Low voltage,  precordial leads Abnormal lateral Q waves Repolarization abnormality, prob rate related Since last tracing rate faster and in afib Confirmed by Isla Pence [phone]) on 10/04/2017 1:43:09 PM   Radiology Dg Chest Port 1 View  Result Date: 10/04/2017 CLINICAL DATA:  Dyspnea EXAM: PORTABLE CHEST 1 VIEW COMPARISON:  07/24/2017 chest radiograph. FINDINGS: Stable cardiomediastinal silhouette with mild cardiomegaly. No pneumothorax. No pleural effusion. Patchy consolidation in mid to lower parahilar right lung is new. IMPRESSION: New patchy mid to lower right parahilar lung consolidation with mild cardiomegaly. Differential includes pneumonia or asymmetric pulmonary edema. Electronically Signed   By: Ilona Sorrel M.D.   On: 10/04/2017 10:55    Procedures Procedures (including critical care time)  Medications Ordered in ED Medications  diltiazem (CARDIZEM) 1 mg/mL load via infusion 20 mg (20 mg Intravenous Bolus from Bag 10/04/17 1032)    And  diltiazem (CARDIZEM) 100 mg in dextrose 5% 170mL (1 mg/mL) infusion (15 mg/hr  Intravenous Rate/Dose Change 10/04/17 1200)  aspirin tablet 325 mg (has no administration in time range)  azithromycin (ZITHROMAX) 500 mg in sodium chloride 0.9 % 250 mL IVPB (has no administration in time range)  magnesium sulfate IVPB 2 g 50 mL (has no administration in time range)  metoprolol tartrate (LOPRESSOR) injection 5 mg (5 mg Intravenous Given 10/04/17 1102)  furosemide (LASIX) injection 60 mg (60 mg Intravenous Given 10/04/17 1106)  metoprolol tartrate (LOPRESSOR) injection 5 mg (5 mg Intravenous Given 10/04/17 1131)  acetaminophen (TYLENOL) tablet 1,000 mg (1,000 mg Oral Given 10/04/17 1131)  ibuprofen (ADVIL,MOTRIN) tablet 800 mg (800 mg Oral Given 10/04/17 1159)  cefTRIAXone (ROCEPHIN) 1 g in sodium chloride 0.9 % 100 mL IVPB (1 g Intravenous New Bag/Given 10/04/17 1308)     Initial Impression / Assessment and Plan / ED Course  I have reviewed the triage vital signs and the nursing notes.  Pertinent labs & imaging results that were available during my care of the patient were reviewed by me and considered in my medical decision making (see chart for details).  CRITICAL CARE Performed by: Isla Pence   Total critical care time: 60 minutes  Critical care time was exclusive of separately billable procedures and treating other patients.  Critical care was necessary to treat or prevent imminent or life-threatening deterioration.  Critical care was time spent personally by me on the following activities: development of treatment plan with patient and/or surrogate as well as nursing, discussions with consultants, evaluation of patient's response to treatment, examination of patient, obtaining history from patient or surrogate, ordering and performing treatments and interventions, ordering and review of laboratory studies, ordering and review of radiographic studies, pulse oximetry and re-evaluation of patient's condition.   HR in the low 200s/upper 190s upon arrival.  Pt given 20 mg  cardizem bolus, then put on a drip.  HR has improved, but is still high.  Pt given 10 mg lopressor IV.  The pt also has a high fever that was treated with tylenol, ibuprofen, and asa.  Fever is starting to break and HR now down to 120s.  Pt looks much more comfortable.  CXR shows pna vs pulmonary edema.  Afib with rvr would cause chf, so she was treated with lasix.  Pt also given IV rocephin and zithromax for her as she had a fever.  UA looks like infection.  Urine sent for cx.  Pt not given IVFs as she is clinically in chf with afib with rvr.  Mg low, so she is given magnesium.  Pt d/w [facility]ad hospitalist (Dr. Roderic Palau) for admission.    Final Clinical Impressions(s) / ED Diagnoses   Final diagnoses:  Atrial fibrillation with RVR ([place])  Community acquired pneumonia of right lung, unspecified part of lung  Fever, unspecified fever cause  Hypomagnesemia  Cystitis  On rivaroxaban therapy    ED Discharge Orders    None       Isla Pence, MD 10/04/17 1342    Isla Pence, MD 10/04/17 1344

## 2017-10-04 NOTE — ED Notes (Signed)
EDP at bedside updating patient. 

## 2017-10-04 NOTE — H&P (Addendum)
History and Physical    [name] ZHG:[id number] DOB: 1946/01/22 DOA: 10/04/2017  PCP: Aletha Halim., PA-C  Patient coming from: Skilled nursing facility  I have personally briefly reviewed patient's old medical records in [place]  Chief Complaint: Shortness of breath  HPI: [name] is a 72 y.o. female with medical history significant of atrial fibrillation on anticoagulation, chronic diastolic congestive heart failure, steroid-dependent rheumatoid arthritis, who is currently at a nursing facility for physical therapy, is brought to the hospital with shortness of breath.  According to the patient, for the past few days she has had a worsening cough which has been nonproductive.  She denies any worsening edema.  She has had no chest pain.  She was told by several staff or family members that she should go to the hospital but did not want to go.  EMS was called today when she had increasing shortness of breath.  On their arrival, she was noted to be significantly tachycardic with a heart rate in the 200s.  He was initially felt that she may be in SVT and she received adenosine without significant benefit.  She also received a dose of Solu-Medrol.  She is brought to the ER for evaluation.  She denies any dysuria, vomiting, diarrhea  ED Course: On arrival to the emergency room she was noted to be markedly tachycardic.  She was also febrile with temperature of 103.  She received medications for fever.  She was also started on a Cardizem infusion.  Chest x-ray indicated pneumonia versus CHF.  BNP only mildly elevated in the 200s.  Remainder lab work is unrevealing.  Urinalysis indicates signs of UTI.  She was started on intravenous antibiotics and referred for admission.  Review of Systems: As per HPI otherwise 10 point review of systems negative.    Past Medical History:  Diagnosis Date  . Abnormal LFTs 07/24/2017  . Actinic keratosis   . Anemia    hx of  . Anemia of chronic  renal failure, stage 3 (moderate) ([place]) 07/17/2015  . Anticoagulant causing adverse effect in therapeutic use 07/17/2015  . Anxiety   . Arthritis    rheumatoid  . Atrial fibrillation ([place])    a. persistent, she remains on Xarelto  . Carpal tunnel syndrome   . Chronic diastolic (congestive) heart failure ([place])    a. 04/2016: echo showing a preserved EF of 55-60% with mild MR. LA and RA midly dilated.   . Edema    left leg at ankle resolved now  . Gastroesophageal reflux disease   . Hepatitis 1970   not sure what kind  . Hiatal hernia   . Hyperlipidemia   . Hypertension   . Iron deficiency anemia due to chronic blood loss 07/17/2015  . Jaundice    age 83  . Peripheral vascular disease ([place]) yrs ago   DVT left lower leg questionale told by 2 drs i had no clot, 1 md said i did  . Rheumatoid arthritis(714.0)   . Right knee pain   . Stroke ([place])   . Type II diabetes mellitus ([place])    type2    Past Surgical History:  Procedure Laterality Date  . CATARACT EXTRACTION Bilateral   . COLONOSCOPY WITH PROPOFOL N/A 02/20/2015   Procedure: COLONOSCOPY WITH PROPOFOL;  Surgeon: Carol Ada, MD;  Location: [facility];  Service: Endoscopy;  Laterality: N/A;  . ENTEROSCOPY N/A 03/23/2016   Procedure: ENTEROSCOPY;  Surgeon: Carol Ada, MD;  Location: [facility];  Service: Endoscopy;  Laterality: N/A;  . ENTEROSCOPY N/A 06/17/2016   Procedure: ENTEROSCOPY;  Surgeon: Carol Ada, MD;  [facility];  Service: Endoscopy;  Laterality: N/A;  . ESOPHAGOGASTRODUODENOSCOPY (EGD) WITH PROPOFOL N/A 02/20/2015   Procedure: ESOPHAGOGASTRODUODENOSCOPY (EGD) WITH PROPOFOL;  Surgeon: Carol Ada, MD;  [facility];  Service: Endoscopy;  Laterality: N/A;  . GIVENS CAPSULE STUDY N/A 03/21/2016   Procedure: GIVENS CAPSULE STUDY;  Surgeon: Carol Ada, MD;  [facility];  Service: Endoscopy;  Laterality: N/A;  . HAND SURGERY  1995 and 1996   artificial joints both hands  .  JOINT REPLACEMENT    . LUMBAR LAMINECTOMY/DECOMPRESSION MICRODISCECTOMY N/A 02/10/2016   Procedure: MICROLUMBAR DECOMPRESSION L4-L5 AND L3- L4, AND EXCISION OF SYNOVIAL CYST L4-L5;  Surgeon: Susa Day, MD;  Location: [facility];  Service: Orthopedics;  Laterality: N/A;  . TOTAL HIP ARTHROPLASTY Left 2009  . TOTAL HIP ARTHROPLASTY Right 04/03/2013   Procedure: RIGHT TOTAL HIP ARTHROPLASTY ANTERIOR APPROACH;  Surgeon: Gearlean Alf, MD;  Location: [facility];  Service: Orthopedics;  Laterality: Right;     reports that she has never smoked. She has never used smokeless tobacco. She reports that she does not drink alcohol or use drugs.  No Known Allergies  Family History  Problem Relation Age of Onset  . Pneumonia Mother 25  . Stroke Father 63  . Heart failure Sister   . Hypertension Sister   . Heart attack Neg Hx      Prior to Admission medications   Medication Sig Start Date End Date Taking? Authorizing Provider  alum & mag hydroxide-simeth (MAALOX/MYLANTA) 200-200-20 MG/5ML suspension Take 30 mLs by mouth every 6 (six) hours as needed for indigestion or heartburn.   Yes [provider]  buPROPion (WELLBUTRIN SR) 150 MG 12 hr tablet Take 150 mg by mouth 2 (two) times daily.     Yes [provider]  Cholecalciferol (VITAMIN D3) 1000 units CAPS Take 1,000 Units by mouth every evening.    Yes [provider]  diltiazem (CARDIZEM CD) 360 MG 24 hr capsule Take 1 capsule (360 mg total) by mouth daily for 2 days. 07/27/17 10/04/17 Yes Bonnita Hollow, MD  esomeprazole (NEXIUM) 40 MG capsule Take 40 mg by mouth every morning.    Yes [provider]  ferrous gluconate (FERGON) 324 MG tablet Take 1 tablet (324 mg total) by mouth 2 (two) times daily with a meal. 05/17/16  Yes Rizwan, Eunice Blase, MD  furosemide (LASIX) 20 MG tablet Take 20 mg by mouth every evening. Patient takes 1 tablet every evening for 1 week; started 10/03/17   Yes [provider]  furosemide  (LASIX) 40 MG tablet Take 40 mg by mouth daily.    Yes [provider]  gabapentin (NEURONTIN) 100 MG capsule Take 1 capsule (100 mg total) by mouth at bedtime. Patient taking differently: Take 300 mg by mouth 3 (three) times daily.  07/04/17  Yes Love, Ivan Anchors, PA-C  glimepiride (AMARYL) 2 MG tablet TAKE 1 TABLET DAILY BEFORE BREAKFAST 07/31/17  Yes [provider]  guaiFENesin-dextromethorphan (ROBITUSSIN DM) 100-10 MG/5ML syrup Take 5 mLs by mouth every 4 (four) hours as needed for cough.   Yes [provider]  leflunomide (ARAVA) 10 MG tablet Take 10 mg by mouth daily.   Yes [provider]  loratadine (CLARITIN) 10 MG tablet Take 10 mg by mouth daily as needed for allergies.   Yes [provider]  meclizine (ANTIVERT) 12.5 MG tablet  Take 12.5 mg by mouth daily as needed for dizziness.   Yes [provider]  metFORMIN (GLUCOPHAGE-XR) 500 MG 24 hr tablet Take 500 mg by mouth 2 (two) times daily.  07/31/17  Yes [provider]  metoprolol tartrate (LOPRESSOR) 50 MG tablet TAKE 2 TABLETS (100 MG TOTAL) BY MOUTH 2 (TWO) TIMES DAILY. 08/22/17  Yes Kilroy, Luke K, PA-C  Multiple Vitamins-Minerals (PRESERVISION AREDS 2) CAPS Take 1 capsule by mouth daily.    Yes [provider]  nitroGLYCERIN (NITROSTAT) 0.4 MG SL tablet Place 1 tablet (0.4 mg total) under the tongue every 5 (five) minutes as needed for chest pain (x 3 doses). 11/28/16  Yes Lelon Perla, MD  Omega-3 1000 MG CAPS Take 1 capsule by mouth daily.   Yes [provider]  ondansetron (ZOFRAN-ODT) 4 MG disintegrating tablet Take 4 mg by mouth every 4 (four) hours as needed for nausea or vomiting.   Yes [provider]  OXYGEN Inhale 2 L into the lungs continuous.   Yes [provider]  potassium chloride SA (K-DUR,KLOR-CON) 20 MEQ tablet Take 1 tablet (20 mEq total) by mouth daily. Patient taking differently: Take 20 mEq by mouth 2 (two) times  daily.  07/05/17  Yes Love, Ivan Anchors, PA-C  predniSONE (DELTASONE) 5 MG tablet Take 5 mg by mouth daily.   Yes [provider]  QUEtiapine (SEROQUEL) 25 MG tablet Take 1 tablet (25 mg total) by mouth 3 (three) times daily as needed (delerium or aggitation). Patient taking differently: Take 25 mg by mouth at bedtime.  07/27/17  Yes Winfrey, Alcario Drought, MD  Rivaroxaban (XARELTO) 15 MG TABS tablet Take 1 tablet (15 mg total) by mouth daily. 07/28/17  Yes Winfrey, Alcario Drought, MD  rosuvastatin (CRESTOR) 20 MG tablet Take 1 tablet (20 mg total) by mouth every evening. 08/03/17  Yes Venancio Poisson, NP  sennosides-docusate sodium (SENOKOT-S) 8.6-50 MG tablet Take 1 tablet by mouth daily.   Yes [provider]  vitamin B-12 1000 MCG tablet Take 1 tablet (1,000 mcg total) by mouth every evening. Patient taking differently: Take 2,000 mcg by mouth every evening.  07/05/17  Yes Love, Ivan Anchors, PA-C  VOLTAREN 1 % GEL Apply 2 g topically at bedtime as needed for pain. Knees, calf 04/27/17  Yes [provider]  Abatacept (ORENCIA CLICKJECT) 570 MG/ML SOAJ Inject 125 mg into the skin every Thursday.     [provider]  amoxicillin-clavulanate (AUGMENTIN) 875-125 MG tablet Take 1 tablet by mouth 2 (two) times daily.    [provider]    Physical Exam: Vitals:   10/04/17 1436 10/04/17 1437 10/04/17 1438 10/04/17 1439  BP:      Pulse: (!) 127 (!) 115 (!) 123 64  Resp: (!) 27 (!) 31 (!) 25 (!) 28  Temp: 99.5 F (37.5 C) 99.5 F (37.5 C) 99.5 F (37.5 C) 99.3 F (37.4 C)  TempSrc:      SpO2: 94% 95% 95% 95%  Weight:        Constitutional: NAD, calm, comfortable Vitals:   10/04/17 1436 10/04/17 1437 10/04/17 1438 10/04/17 1439  BP:      Pulse: (!) 127 (!) 115 (!) 123 64  Resp: (!) 27 (!) 31 (!) 25 (!) 28  Temp: 99.5 F (37.5 C) 99.5 F (37.5 C) 99.5 F (37.5 C) 99.3 F (37.4 C)  TempSrc:      SpO2: 94% 95% 95% 95%  Weight:  Eyes: PERRL, lids and  conjunctivae normal ENMT: Mucous membranes are moist. Posterior pharynx clear of any exudate or lesions.Normal dentition.  Neck: normal, supple, no masses, no thyromegaly Respiratory: Bilateral rhonchi. Normal respiratory effort. No accessory muscle use.  Cardiovascular: Irregular, no murmurs / rubs / gallops. No extremity edema. 2+ pedal pulses. No carotid bruits.  Abdomen: no tenderness, no masses palpated. No hepatosplenomegaly. Bowel sounds positive.  Musculoskeletal: no clubbing / cyanosis. No joint deformity upper and lower extremities. Good ROM, no contractures. Normal muscle tone.  Skin: no rashes, lesions, ulcers. No induration Neurologic: CN 2-12 grossly intact. Sensation intact, DTR normal. Strength 5/5 in all 4.  Psychiatric: Normal judgment and insight. Alert and oriented x 3. Normal mood.    Labs on Admission: I have personally reviewed following labs and imaging studies  CBC: Recent Labs  Lab 10/04/17 1045  WBC 6.8  HGB 12.3  HCT 39.2  MCV 82.2  PLT 563   Basic Metabolic Panel: Recent Labs  Lab 10/04/17 1045  NA 132*  K 3.7  CL 97*  CO2 21*  GLUCOSE 187*  BUN 23  CREATININE 0.98  CALCIUM 8.6*  MG 1.4*   GFR: Estimated Creatinine Clearance: 54.6 mL/min (by C-G formula based on SCr of 0.98 mg/dL). Liver Function Tests: No results for input(s): AST, ALT, ALKPHOS, BILITOT, PROT, ALBUMIN in the last 168 hours. No results for input(s): LIPASE, AMYLASE in the last 168 hours. No results for input(s): AMMONIA in the last 168 hours. Coagulation Profile: No results for input(s): INR, PROTIME in the last 168 hours. Cardiac Enzymes: Recent Labs  Lab 10/04/17 1045  TROPONINI <0.03   BNP (last 3 results) No results for input(s): PROBNP in the last 8760 hours. HbA1C: No results for input(s): HGBA1C in the last 72 hours. CBG: No results for input(s): GLUCAP in the last 168 hours. Lipid Profile: No results for input(s): CHOL, HDL, LDLCALC, TRIG, CHOLHDL,  LDLDIRECT in the last 72 hours. Thyroid Function Tests: No results for input(s): TSH, T4TOTAL, FREET4, T3FREE, THYROIDAB in the last 72 hours. Anemia Panel: No results for input(s): VITAMINB12, FOLATE, FERRITIN, TIBC, IRON, RETICCTPCT in the last 72 hours. Urine analysis:    Component Value Date/Time   COLORURINE YELLOW 10/04/2017 1140   APPEARANCEUR HAZY (A) 10/04/2017 1140   LABSPEC 1.025 10/04/2017 1140   PHURINE 5.0 10/04/2017 1140   GLUCOSEU NEGATIVE 10/04/2017 1140   HGBUR TRACE (A) 10/04/2017 1140   BILIRUBINUR NEGATIVE 10/04/2017 1140   KETONESUR NEGATIVE 10/04/2017 1140   PROTEINUR 30 (A) 10/04/2017 1140   UROBILINOGEN 1.0 10/09/2014 0040   NITRITE POSITIVE (A) 10/04/2017 1140   LEUKOCYTESUR SMALL (A) 10/04/2017 1140    Radiological Exams on Admission: Dg Chest Port 1 View  Result Date: 10/04/2017 CLINICAL DATA:  Dyspnea EXAM: PORTABLE CHEST 1 VIEW COMPARISON:  07/24/2017 chest radiograph. FINDINGS: Stable cardiomediastinal silhouette with mild cardiomegaly. No pneumothorax. No pleural effusion. Patchy consolidation in mid to lower parahilar right lung is new. IMPRESSION: New patchy mid to lower right parahilar lung consolidation with mild cardiomegaly. Differential includes pneumonia or asymmetric pulmonary edema. Electronically Signed   By: Ilona Sorrel M.D.   On: 10/04/2017 10:55    EKG: Independently reviewed.  Rapid atrial fibrillation  Assessment/Plan Active Problems:   Hypertension   Rheumatoid arthritis (HCC)   Gastroesophageal reflux disease   Chronic atrial fibrillation (HCC)   Type II diabetes mellitus (HCC)   Hyperlipidemia   Chronic diastolic (congestive) heart failure (HCC)   Atrial fibrillation with RVR ([name])  Sepsis ([place])   HCAP (healthcare-associated pneumonia)   Acute lower UTI   72 year old female admitted to the hospital with sepsis due to pneumonia/UTI and also has rapid atrial fibrillation.  She is critically ill and is requiring Cardizem  infusion with frequent titration and monitoring.  She will be admitted to stepdown unit.  1. Sepsis.  Related to pneumonia/UTI.  Lactic acid elevated at 3 on admission.  Blood cultures been sent.  Start intravenous antibiotics.  Fever has improved after receiving Tylenol and ibuprofen.  Continue to monitor hemodynamic status.  Will give fluid per sepsis protocol. 2. Atrial fibrillation with rapid ventricular response.  Likely precipitated by underlying infectious process/fever.  Currently on Cardizem infusion.  She does take large doses of Cardizem and metoprolol by mouth.  Blood pressures on the soft side.  Will start IV fluids.  As blood pressure tolerates, will restart oral medications.  She is anticoagulated with Xarelto. 3. Healthcare associated pneumonia.  Started on vancomycin and cefepime.  Could potentially discontinue vancomycin if MRSA PCR is negative. 4. Urinary tract infection.  Urine culture has been sent.  Follow-up and continue current antibiotics 5. Rheumatoid arthritis.  Continue on home dose of prednisone.  If starts to become hypotensive, can consider starting stress dose hydrocortisone. 6. Diabetes.  Start on sliding scale insulin.  Hold oral agents. 7. GERD.  Continue on PPI 8. Hyperlipidemia.  Continue statin 9. Chronic diastolic congestive heart failure.  Will hold Lasix for now.  Appears compensated.  DVT prophylaxis: Xarelto Code Status: Full code Family Communication: Informed Lattie Haw over the phone Disposition Plan: Discharge back to nursing facility once stabilized Consults called:   Admission status: Inpatient, stepdown  Critical care time: 50 minutes  Kathie Dike MD [facility] Pager [phone]  If 7PM-7AM, please contact night-coverage [email or web address] Password [email or web address]  10/04/2017, 2:54 PM

## 2017-10-05 DIAGNOSIS — E118 Type 2 diabetes mellitus with unspecified complications: Secondary | ICD-10-CM

## 2017-10-05 DIAGNOSIS — K219 Gastro-esophageal reflux disease without esophagitis: Secondary | ICD-10-CM

## 2017-10-05 LAB — BASIC METABOLIC PANEL
Anion gap: 11 (ref 5–15)
BUN: 28 mg/dL — ABNORMAL HIGH (ref 8–23)
CHLORIDE: 111 mmol/L (ref 98–111)
CO2: 19 mmol/L — AB (ref 22–32)
CREATININE: 0.88 mg/dL (ref 0.44–1.00)
Calcium: 7.6 mg/dL — ABNORMAL LOW (ref 8.9–10.3)
GFR calc Af Amer: >60 mL/min (ref >60)
GFR calc non Af Amer: >60 mL/min (ref >60)
GLUCOSE: 342 mg/dL — AB (ref 70–99)
Potassium: 3.1 mmol/L — ABNORMAL LOW (ref 3.5–5.1)
Sodium: 141 mmol/L (ref 135–145)

## 2017-10-05 LAB — CBC
HCT: 31.9 % — ABNORMAL LOW (ref 36.0–46.0)
Hemoglobin: 9.9 g/dL — ABNORMAL LOW (ref 12.0–15.0)
MCH: 25.8 pg — AB (ref 26.0–34.0)
MCHC: 31 g/dL (ref 30.0–36.0)
MCV: 83.1 fL (ref 78.0–100.0)
PLATELETS: 163 10*3/uL (ref 150–400)
RBC: 3.84 MIL/uL — ABNORMAL LOW (ref 3.87–5.11)
RDW: 16.9 % — AB (ref 11.5–15.5)
WBC: 6.3 10*3/uL (ref 4.0–10.5)

## 2017-10-05 LAB — MAGNESIUM: MAGNESIUM: 2.1 mg/dL (ref 1.7–2.4)

## 2017-10-05 LAB — GLUCOSE, CAPILLARY
GLUCOSE-CAPILLARY: 199 mg/dL — AB (ref 70–99)
Glucose-Capillary: 254 mg/dL — ABNORMAL HIGH (ref 70–99)
Glucose-Capillary: 104 mg/dL — ABNORMAL HIGH (ref 70–99)
Glucose-Capillary: 231 mg/dL — ABNORMAL HIGH (ref 70–99)

## 2017-10-05 LAB — MRSA PCR SCREENING: MRSA by PCR: NEGATIVE

## 2017-10-05 LAB — LACTIC ACID, PLASMA: Lactic Acid, Venous: 2.2 mmol/L (ref 0.5–1.9)

## 2017-10-05 MED ORDER — METOPROLOL TARTRATE 25 MG PO TABS
25.0000 mg | ORAL_TABLET | Freq: Two times a day (BID) | ORAL | Status: DC
Start: 2017-10-05 — End: 2017-10-06
  Administered 2017-10-05 – 2017-10-06 (×3): 25 mg via ORAL
  Filled 2017-10-05 (×3): qty 1

## 2017-10-05 MED ORDER — POTASSIUM CHLORIDE IN NACL 20-0.9 MEQ/L-% IV SOLN
INTRAVENOUS | Status: DC
  Administered 2017-10-05 – 2017-10-06 (×2): via INTRAVENOUS

## 2017-10-05 MED ORDER — INSULIN GLARGINE 100 UNIT/ML ~~LOC~~ SOLN
15.0000 [IU] | SUBCUTANEOUS | Status: DC
  Administered 2017-10-05 – 2017-10-06 (×2): 15 [IU] via SUBCUTANEOUS
  Filled 2017-10-05 (×2): qty 0.15

## 2017-10-05 MED ORDER — INSULIN ASPART 100 UNIT/ML ~~LOC~~ SOLN
5.0000 [IU] | Freq: Three times a day (TID) | SUBCUTANEOUS | Status: DC
  Administered 2017-10-05 (×3): 5 [IU] via SUBCUTANEOUS

## 2017-10-05 MED ORDER — GUAIFENESIN-DM 100-10 MG/5ML PO SYRP
5.0000 mL | ORAL_SOLUTION | ORAL | Status: DC | PRN
  Administered 2017-10-06 – 2017-10-09 (×5): 5 mL via ORAL
  Filled 2017-10-05 (×6): qty 5

## 2017-10-05 MED ORDER — POTASSIUM CHLORIDE 10 MEQ/100ML IV SOLN
10.0000 meq | INTRAVENOUS | Status: AC
  Administered 2017-10-05 (×5): 10 meq via INTRAVENOUS
  Filled 2017-10-05 (×4): qty 100

## 2017-10-05 NOTE — Progress Notes (Addendum)
PROGRESS NOTE    [name]  ZOX:[id number]  DOB: 12-13-1945  DOA: 10/04/2017 PCP: Aletha Halim., PA-C  Brief Admission Hx: [name] is a 72 y.o. female with medical history significant of atrial fibrillation on anticoagulation, chronic diastolic congestive heart failure, steroid-dependent rheumatoid arthritis, who is currently at a nursing facility for physical therapy, is brought to the hospital with shortness of breath.  Pt was admitted with pneumonia, UTI and Atrial fibrillation with RVR.    MDM/Assessment & Plan:  1. Sepsis.  Slowly improving.  Suspect this is primarily due to pneumonia/UTI.  Lactic acid elevated at 3 on admission.  It is trending down.  Continue supportive therapy.  Blood cultures no growth to date.  Continue intravenous antibiotics.  DC vanc as MRSA screen negative.  Fever has improved.  Continue to monitor hemodynamic status.  2. Atrial fibrillation with rapid ventricular response.  Likely precipitated by underlying infectious process/fever.  Currently on Cardizem infusion and rate is slowly improving.  Resume metoprolol 25 mg BID as blood pressure allows.  She does take large doses of Cardizem and metoprolol by mouth.  Blood pressures on the soft side.  Continue IV fluids. Will ask cardiology to see tomorrow.  She is anticoagulated with Xarelto. 3. Healthcare associated pneumonia.  Started on vancomycin and cefepime.  Discontinue vancomycin as MRSA PCR is negative. 4. Urinary tract infection.  Urine culture has been sent.  Follow-up and continue current antibiotics.  5. Rheumatoid arthritis.  Continue on home dose of prednisone. 6. Diabetes type 2 uncontrolled, added lantus and prandial insulin. Follow closely.  Continue sliding scale insulin.  Hold home oral agents. 7. GERD.  Continue on PPI for GI protection.  8. Hyperlipidemia.  Continue statin 9. Chronic diastolic congestive heart failure.  Will hold Lasix for now.  Appears compensated. 10. Anemia of  chronic disease - monitoring closely, Hg 9.9.  Recheck in AM.    DVT prophylaxis: Xarelto Code Status: Full code Family Communication: Informed Lattie Haw over the phone Disposition Plan: Discharge back to nursing facility once stabilized Consults called:   Admission status: Inpatient, stepdown  Subjective: Pt says that she does not feel well.  She says that she is coughing and has dry mouth.    Objective: Vitals:   10/05/17 0630 10/05/17 0645 10/05/17 0740 10/05/17 0757  BP: 114/69 116/70    Pulse: (!) 115 (!) 117 (!) 117   Resp: (!) 22 19 (!) 27   Temp: (!) 96.6 F (35.9 C) (!) 96.6 F (35.9 C) (!) 96.3 F (35.7 C)   TempSrc:      SpO2: 96% 96% 95% 94%  Weight:      Height:        Intake/Output Summary (Last 24 hours) at 10/05/2017 8119 Last data filed at 10/05/2017 1478 Gross per 24 hour  Intake 4680.38 ml  Output 2400 ml  Net 2280.38 ml   Filed Weights   10/04/17 1022 10/05/17 0500  Weight: 74.4 kg (164 lb) 77.8 kg (171 lb 8.3 oz)   REVIEW OF SYSTEMS  As per history otherwise all reviewed and reported negative  Exam:  General exam: awake, alert, NAD, cooperative.   Respiratory system: coarse rales heard bilateral. No increased work of breathing. Cardiovascular system: irregularly irregular, tachycardic, No JVD, murmurs, gallops, clicks or pedal edema. Gastrointestinal system: Abdomen is nondistended, soft and nontender. Normal bowel sounds heard. Central nervous system: Alert and oriented. No focal neurological deficits. Extremities: no CCE.  Data Reviewed: Basic Metabolic Panel:  Recent Labs  Lab 10/04/17 1045 10/05/17 0418  NA 132* 141  K 3.7 3.1*  CL 97* 111  CO2 21* 19*  GLUCOSE 187* 342*  BUN 23 28*  CREATININE 0.98 0.88  CALCIUM 8.6* 7.6*  MG 1.4* 2.1   Liver Function Tests: No results for input(s): AST, ALT, ALKPHOS, BILITOT, PROT, ALBUMIN in the last 168 hours. No results for input(s): LIPASE, AMYLASE in the last 168 hours. No results  for input(s): AMMONIA in the last 168 hours. CBC: Recent Labs  Lab 10/04/17 1045 10/05/17 0418  WBC 6.8 6.3  HGB 12.3 9.9*  HCT 39.2 31.9*  MCV 82.2 83.1  PLT 198 163   Cardiac Enzymes: Recent Labs  Lab 10/04/17 1045  TROPONINI <0.03   CBG (last 3)  Recent Labs    10/04/17 1655 10/04/17 2122 10/05/17 0744  GLUCAP 279* 370* 254*   Recent Results (from the past 240 hour(s))  Culture, blood (routine x 2)     Status: None (Preliminary result)   Collection Time: 10/04/17 12:15 PM  Result Value Ref Range Status   Specimen Description BLOOD LEFT ARM  Final   Special Requests   Final    BOTTLES DRAWN AEROBIC AND ANAEROBIC Blood Culture adequate volume   Culture   Final    NO GROWTH < 24 HOURS Performed at [facility], [address] [place], [place] [address]    Report Status PENDING  Incomplete  Culture, blood (routine x 2)     Status: None (Preliminary result)   Collection Time: 10/04/17 12:23 PM  Result Value Ref Range Status   Specimen Description BLOOD LEFT WRIST  Final   Special Requests   Final    BOTTLES DRAWN AEROBIC ONLY Blood Culture results may not be optimal due to an inadequate volume of blood received in culture bottles   Culture   Final    NO GROWTH < 24 HOURS Performed at [facility], [address] [place], [place] [address]    Report Status PENDING  Incomplete  MRSA PCR Screening     Status: None   Collection Time: 10/04/17  4:30 PM  Result Value Ref Range Status   MRSA by PCR NEGATIVE NEGATIVE Final    Comment:        The GeneXpert MRSA Assay (FDA approved for NASAL specimens only), is one component of a comprehensive MRSA colonization surveillance program. It is not intended to diagnose MRSA infection nor to guide or monitor treatment for MRSA infections. Performed at [facility], [address] [place], [place] [address]     Studies: Dg Chest [facility] 1 View  Result Date: 10/04/2017 CLINICAL DATA:  Dyspnea EXAM: PORTABLE CHEST 1 VIEW  COMPARISON:  07/24/2017 chest radiograph. FINDINGS: Stable cardiomediastinal silhouette with mild cardiomegaly. No pneumothorax. No pleural effusion. Patchy consolidation in mid to lower parahilar right lung is new. IMPRESSION: New patchy mid to lower right parahilar lung consolidation with mild cardiomegaly. Differential includes pneumonia or asymmetric pulmonary edema. Electronically Signed   By: Ilona Sorrel M.D.   On: 10/04/2017 10:55   Scheduled Meds: . buPROPion  150 mg Oral BID  . gabapentin  300 mg Oral TID  . guaiFENesin  600 mg Oral BID  . insulin aspart  0-15 Units Subcutaneous TID WC  . insulin aspart  0-5 Units Subcutaneous QHS  . insulin aspart  5 Units Subcutaneous TID WC  . insulin glargine  15 Units Subcutaneous [id number]  . ipratropium  0.5 mg Nebulization Q6H  .  levalbuterol  0.63 mg Nebulization Q6H  . metoprolol tartrate  25 mg Oral BID  . pantoprazole  40 mg Oral Daily  . predniSONE  5 mg Oral Daily  . QUEtiapine  25 mg Oral QHS  . Rivaroxaban  15 mg Oral Daily  . rosuvastatin  20 mg Oral QPM   Continuous Infusions: . 0.9 % NaCl with KCl 20 mEq / L    . ceFEPime (MAXIPIME) IV Stopped (10/05/17 7672)  . diltiazem (CARDIZEM) infusion 15 mg/hr (10/05/17 0737)  . potassium chloride 10 mEq (10/05/17 0737)  . vancomycin Stopped (10/05/17 0947)    Active Problems:   Hypertension   Rheumatoid arthritis ([place])   Gastroesophageal reflux disease   Chronic atrial fibrillation (HCC)   Type II diabetes mellitus (HCC)   Hyperlipidemia   Chronic diastolic (congestive) heart failure (HCC)   Atrial fibrillation with RVR ([place])   Sepsis ([place])   HCAP (healthcare-associated pneumonia)   Acute lower UTI  Critical Care Time spent: 31 minutes  Irwin Brakeman, MD, FAAFP [facility] Pager [phone]  If 7PM-7AM, please contact night-coverage [email or web address] Password [email or web address] 10/05/2017, 8:08 AM    LOS: 1 day

## 2017-10-06 ENCOUNTER — Encounter: Payer: No Typology Code available for payment source | Attending: Physical Medicine & Rehabilitation

## 2017-10-06 ENCOUNTER — Ambulatory Visit: Payer: Self-pay | Admitting: Physical Medicine & Rehabilitation

## 2017-10-06 ENCOUNTER — Inpatient Hospital Stay (HOSPITAL_COMMUNITY): Payer: Medicare Other

## 2017-10-06 DIAGNOSIS — N183 Chronic kidney disease, stage 3 (moderate): Secondary | ICD-10-CM

## 2017-10-06 DIAGNOSIS — N390 Urinary tract infection, site not specified: Secondary | ICD-10-CM

## 2017-10-06 DIAGNOSIS — M6281 Muscle weakness (generalized): Secondary | ICD-10-CM | POA: Insufficient documentation

## 2017-10-06 DIAGNOSIS — I5032 Chronic diastolic (congestive) heart failure: Secondary | ICD-10-CM

## 2017-10-06 DIAGNOSIS — Z9842 Cataract extraction status, left eye: Secondary | ICD-10-CM | POA: Insufficient documentation

## 2017-10-06 DIAGNOSIS — J189 Pneumonia, unspecified organism: Secondary | ICD-10-CM

## 2017-10-06 DIAGNOSIS — Z823 Family history of stroke: Secondary | ICD-10-CM | POA: Insufficient documentation

## 2017-10-06 DIAGNOSIS — I10 Essential (primary) hypertension: Secondary | ICD-10-CM

## 2017-10-06 DIAGNOSIS — I482 Chronic atrial fibrillation: Secondary | ICD-10-CM

## 2017-10-06 DIAGNOSIS — I69312 Visuospatial deficit and spatial neglect following cerebral infarction: Secondary | ICD-10-CM | POA: Insufficient documentation

## 2017-10-06 DIAGNOSIS — M05752 Rheumatoid arthritis with rheumatoid factor of left hip without organ or systems involvement: Secondary | ICD-10-CM

## 2017-10-06 DIAGNOSIS — E785 Hyperlipidemia, unspecified: Secondary | ICD-10-CM

## 2017-10-06 DIAGNOSIS — I69398 Other sequelae of cerebral infarction: Secondary | ICD-10-CM | POA: Insufficient documentation

## 2017-10-06 DIAGNOSIS — A419 Sepsis, unspecified organism: Principal | ICD-10-CM

## 2017-10-06 DIAGNOSIS — I4891 Unspecified atrial fibrillation: Secondary | ICD-10-CM

## 2017-10-06 DIAGNOSIS — Z9841 Cataract extraction status, right eye: Secondary | ICD-10-CM | POA: Insufficient documentation

## 2017-10-06 DIAGNOSIS — Z8249 Family history of ischemic heart disease and other diseases of the circulatory system: Secondary | ICD-10-CM | POA: Insufficient documentation

## 2017-10-06 DIAGNOSIS — Z7901 Long term (current) use of anticoagulants: Secondary | ICD-10-CM | POA: Insufficient documentation

## 2017-10-06 DIAGNOSIS — E119 Type 2 diabetes mellitus without complications: Secondary | ICD-10-CM | POA: Insufficient documentation

## 2017-10-06 DIAGNOSIS — Z96643 Presence of artificial hip joint, bilateral: Secondary | ICD-10-CM | POA: Insufficient documentation

## 2017-10-06 DIAGNOSIS — M069 Rheumatoid arthritis, unspecified: Secondary | ICD-10-CM | POA: Insufficient documentation

## 2017-10-06 DIAGNOSIS — I69319 Unspecified symptoms and signs involving cognitive functions following cerebral infarction: Secondary | ICD-10-CM | POA: Insufficient documentation

## 2017-10-06 DIAGNOSIS — R269 Unspecified abnormalities of gait and mobility: Secondary | ICD-10-CM | POA: Insufficient documentation

## 2017-10-06 LAB — CBC WITH DIFFERENTIAL/PLATELET
BASOS ABS: 0 10*3/uL (ref 0.0–0.1)
Basophils Relative: 0 %
EOS PCT: 0 %
Eosinophils Absolute: 0 10*3/uL (ref 0.0–0.7)
HEMATOCRIT: 32.4 % — AB (ref 36.0–46.0)
Hemoglobin: 10 g/dL — ABNORMAL LOW (ref 12.0–15.0)
LYMPHS ABS: 0.9 10*3/uL (ref 0.7–4.0)
Lymphocytes Relative: 8 %
MCH: 26 pg (ref 26.0–34.0)
MCHC: 30.9 g/dL (ref 30.0–36.0)
MCV: 84.4 fL (ref 78.0–100.0)
MONO ABS: 0.6 10*3/uL (ref 0.1–1.0)
MONOS PCT: 6 %
NEUTROS ABS: 9.3 10*3/uL — AB (ref 1.7–7.7)
Neutrophils Relative %: 86 %
PLATELETS: 222 10*3/uL (ref 150–400)
RBC: 3.84 MIL/uL — ABNORMAL LOW (ref 3.87–5.11)
RDW: 17.4 % — AB (ref 11.5–15.5)
WBC: 10.8 10*3/uL — ABNORMAL HIGH (ref 4.0–10.5)

## 2017-10-06 LAB — GLUCOSE, CAPILLARY
Glucose-Capillary: 97 mg/dL (ref 70–99)
Glucose-Capillary: 62 mg/dL — ABNORMAL LOW (ref 70–99)
Glucose-Capillary: 99 mg/dL (ref 70–99)
Glucose-Capillary: 109 mg/dL — ABNORMAL HIGH (ref 70–99)
Glucose-Capillary: 220 mg/dL — ABNORMAL HIGH (ref 70–99)
Glucose-Capillary: 252 mg/dL — ABNORMAL HIGH (ref 70–99)

## 2017-10-06 LAB — BASIC METABOLIC PANEL
Anion gap: 8 (ref 5–15)
BUN: 22 mg/dL (ref 8–23)
CO2: 23 mmol/L (ref 22–32)
CREATININE: 0.7 mg/dL (ref 0.44–1.00)
Calcium: 8.5 mg/dL — ABNORMAL LOW (ref 8.9–10.3)
Chloride: 113 mmol/L — ABNORMAL HIGH (ref 98–111)
GFR calc Af Amer: >60 mL/min (ref >60)
GLUCOSE: 73 mg/dL (ref 70–99)
Potassium: 4.3 mmol/L (ref 3.5–5.1)
SODIUM: 144 mmol/L (ref 135–145)

## 2017-10-06 LAB — MAGNESIUM: MAGNESIUM: 1.9 mg/dL (ref 1.7–2.4)

## 2017-10-06 LAB — URINE CULTURE: Culture: >=100000 — AB

## 2017-10-06 MED ORDER — DOXYCYCLINE HYCLATE 100 MG PO TABS
100.0000 mg | ORAL_TABLET | Freq: Two times a day (BID) | ORAL | Status: AC
  Administered 2017-10-06 (×2): 100 mg via ORAL
  Filled 2017-10-06 (×2): qty 1

## 2017-10-06 MED ORDER — GUAIFENESIN ER 600 MG PO TB12
1200.0000 mg | ORAL_TABLET | Freq: Two times a day (BID) | ORAL | Status: DC
  Administered 2017-10-06 – 2017-10-10 (×9): 1200 mg via ORAL
  Filled 2017-10-06 (×9): qty 2

## 2017-10-06 MED ORDER — INSULIN GLARGINE 100 UNIT/ML ~~LOC~~ SOLN
12.0000 [IU] | SUBCUTANEOUS | Status: DC
  Administered 2017-10-07: 12 [IU] via SUBCUTANEOUS
  Filled 2017-10-06 (×3): qty 0.12

## 2017-10-06 MED ORDER — SODIUM CHLORIDE 0.9 % IV SOLN
2.0000 g | Freq: Two times a day (BID) | INTRAVENOUS | Status: DC
  Administered 2017-10-06 – 2017-10-10 (×8): 2 g via INTRAVENOUS
  Filled 2017-10-06 (×11): qty 2

## 2017-10-06 MED ORDER — METHYLPREDNISOLONE SODIUM SUCC 40 MG IJ SOLR
20.0000 mg | Freq: Two times a day (BID) | INTRAMUSCULAR | Status: DC
  Administered 2017-10-06 – 2017-10-07 (×4): 20 mg via INTRAVENOUS
  Filled 2017-10-06 (×4): qty 1

## 2017-10-06 MED ORDER — POTASSIUM CHLORIDE CRYS ER 20 MEQ PO TBCR
60.0000 meq | EXTENDED_RELEASE_TABLET | Freq: Two times a day (BID) | ORAL | Status: AC
  Administered 2017-10-06 – 2017-10-07 (×3): 60 meq via ORAL
  Filled 2017-10-06 (×3): qty 3

## 2017-10-06 MED ORDER — LEVALBUTEROL HCL 0.63 MG/3ML IN NEBU
0.6300 mg | INHALATION_SOLUTION | RESPIRATORY_TRACT | Status: DC
  Administered 2017-10-06 – 2017-10-07 (×8): 0.63 mg via RESPIRATORY_TRACT
  Filled 2017-10-06 (×8): qty 3

## 2017-10-06 MED ORDER — RIVAROXABAN 20 MG PO TABS
20.0000 mg | ORAL_TABLET | Freq: Every day | ORAL | Status: DC
  Administered 2017-10-06 – 2017-10-10 (×5): 20 mg via ORAL
  Filled 2017-10-06 (×5): qty 1

## 2017-10-06 MED ORDER — INSULIN ASPART 100 UNIT/ML ~~LOC~~ SOLN
4.0000 [IU] | Freq: Three times a day (TID) | SUBCUTANEOUS | Status: DC
  Administered 2017-10-06 (×2): 4 [IU] via SUBCUTANEOUS

## 2017-10-06 MED ORDER — DOXYCYCLINE HYCLATE 100 MG PO TABS: 100.0000 mg | ORAL_TABLET | Freq: Two times a day (BID) | ORAL | Status: DC

## 2017-10-06 MED ORDER — FUROSEMIDE 10 MG/ML IJ SOLN
60.0000 mg | Freq: Two times a day (BID) | INTRAMUSCULAR | Status: DC
  Administered 2017-10-06 (×2): 60 mg via INTRAVENOUS
  Filled 2017-10-06 (×2): qty 6

## 2017-10-06 MED ORDER — IPRATROPIUM BROMIDE 0.02 % IN SOLN
0.5000 mg | RESPIRATORY_TRACT | Status: DC
  Administered 2017-10-06 – 2017-10-07 (×8): 0.5 mg via RESPIRATORY_TRACT
  Filled 2017-10-06 (×8): qty 2.5

## 2017-10-06 MED ORDER — METOPROLOL TARTRATE 50 MG PO TABS
50.0000 mg | ORAL_TABLET | Freq: Two times a day (BID) | ORAL | Status: DC
  Administered 2017-10-06 – 2017-10-10 (×8): 50 mg via ORAL
  Filled 2017-10-06 (×8): qty 1

## 2017-10-06 NOTE — Progress Notes (Signed)
Patient having difficulty breathing this AM. Audible rhonchi and wheezes. Pt severely congested. Also faint crackles noted. PRN breahing tx given.

## 2017-10-06 NOTE — Progress Notes (Signed)
Hypoglycemic Event  CBG: 66  Treatment: orange juice and breakfast given   Symptoms: none  Follow-up CBG: IOMB:[id number] CBG Result:99  Possible Reasons for Event: insulin coverage, decreased appetite   Comments/MD notified:    Tereasa Coop

## 2017-10-06 NOTE — Care Management Important Message (Signed)
Important Message  Patient Details  Name: ARAH ARO MRN: [id number] Date of Birth: [birth year]   Medicare Important Message Given:  Yes    [place] Stegemann, Chauncey [place], RN 10/06/2017, 2:03 PM

## 2017-10-06 NOTE — Progress Notes (Signed)
PROGRESS NOTE    [name]  ZOX:[id number]  DOB: 12/20/1945  DOA: 10/04/2017 PCP: Aletha Halim., PA-C  Brief Admission Hx: [name] is a 72 y.o. female with medical history significant of atrial fibrillation on anticoagulation, chronic diastolic congestive heart failure, steroid-dependent rheumatoid arthritis, who is currently at a nursing facility for physical therapy, is brought to the hospital with shortness of breath.  Pt was admitted with pneumonia, UTI and Atrial fibrillation with RVR.    MDM/Assessment & Plan:  1. Sepsis.  Slowly improving.  Suspect this is primarily due to pneumonia/UTI.  Lactic acid elevated at 3 on admission.  It is trending down.  Continue supportive therapy.  Blood cultures no growth to date.  Continue intravenous antibiotics.  DC vanc as MRSA screen negative.  Fever has improved.  Continue to monitor hemodynamic status.  2. Atrial fibrillation with rapid ventricular response.  Poorly controlled on diltiazem / metoprolol.  Likely precipitated by underlying infectious process/fever.  Currently on Cardizem infusion and rate is 150-160.  Resumed metoprolol 25 mg BID as blood pressure allows.  She does take large doses of Cardizem and metoprolol by mouth.  Blood pressures on the soft side.  Will ask cardiology to see today.  She is anticoagulated with Xarelto. 3. Hypokalemia - Repleted.  oral replacement ordered.  Magnesium followed.  4. Pulmonary Edema - seen on morning CXR - IV lasix ordered 60 mg IV BID x 3 doses.  5. Multifocal Healthcare associated pneumonia.  Started on vancomycin and cefepime.  Discontinue vancomycin as MRSA PCR is negative.  Added doxycycline.  6. Urinary tract infection.  Urine culture has been sent.  Follow-up and continue current antibiotics.  7. Rheumatoid arthritis.  stress dose steroids started 7/5. 8. Diabetes type 2 uncontrolled, added lantus and prandial insulin. Follow closely.  Continue sliding scale insulin.  Hold home  oral agents. 9. GERD.  Continue on PPI for GI protection.  10. Hyperlipidemia.  Continue statin 11. Chronic diastolic congestive heart failure with acute exacerbation - IV lasix ordered.  Stop IV fluids. 12. Anemia of chronic disease - monitoring closely, Hg 10 and stable.  Recheck in AM.    DVT prophylaxis: Xarelto Code Status: Full code Family Communication: Informed Lattie Haw over the phone Disposition Plan: Discharge back to nursing facility once medically stabilized Consults called:   Admission status: Inpatient, stepdown  Subjective:   Pt having a productive cough and lots of chest congestion. No chest pain.  Positive palpitations.   Objective: Vitals:   10/06/17 0215 10/06/17 0230 10/06/17 0300 10/06/17 0330  BP:  122/72 122/83 125/75  Pulse:  (!) 113 (!) 131 (!) 122  Resp:  (!) 29 (!) 25 (!) 26  Temp:  99.7 F (37.6 C) 99.7 F (37.6 C) 99.7 F (37.6 C)  TempSrc:      SpO2: 96% 95% 92% 94%  Weight:   82.1 kg (181 lb)   Height:        Intake/Output Summary (Last 24 hours) at 10/06/2017 8119 Last data filed at 10/06/2017 0400 Gross per 24 hour  Intake 1931.33 ml  Output 2450 ml  Net -518.67 ml   Filed Weights   10/04/17 1022 10/05/17 0500 10/06/17 0300  Weight: 74.4 kg (164 lb) 77.8 kg (171 lb 8.3 oz) 82.1 kg (181 lb)   REVIEW OF SYSTEMS  As per history otherwise all reviewed and reported negative  Exam:  General exam: awake, alert, NAD, cooperative.   Respiratory system: coarse rales heard bilateral  and diffuse crackles. No increased work of breathing. Cardiovascular system: irregularly irregular, tachycardic, No JVD, murmurs, gallops, clicks or pedal edema. Gastrointestinal system: Abdomen is nondistended, soft and nontender. Normal bowel sounds heard. Central nervous system: Alert and oriented. No focal neurological deficits. Extremities: no CCE.  Data Reviewed: Basic Metabolic Panel: Recent Labs  Lab 10/04/17 1045 10/05/17 0418  NA 132* 141  K  3.7 3.1*  CL 97* 111  CO2 21* 19*  GLUCOSE 187* 342*  BUN 23 28*  CREATININE 0.98 0.88  CALCIUM 8.6* 7.6*  MG 1.4* 2.1   Liver Function Tests: No results for input(s): AST, ALT, ALKPHOS, BILITOT, PROT, ALBUMIN in the last 168 hours. No results for input(s): LIPASE, AMYLASE in the last 168 hours. No results for input(s): AMMONIA in the last 168 hours. CBC: Recent Labs  Lab 10/04/17 1045 10/05/17 0418  WBC 6.8 6.3  HGB 12.3 9.9*  HCT 39.2 31.9*  MCV 82.2 83.1  PLT 198 163   Cardiac Enzymes: Recent Labs  Lab 10/04/17 1045  TROPONINI <0.03   CBG (last 3)  Recent Labs    10/05/17 1704 10/05/17 2137 10/06/17 0254  GLUCAP 104* 231* 97   Recent Results (from the past 240 hour(s))  Urine culture     Status: Abnormal (Preliminary result)   Collection Time: 10/04/17 11:40 AM  Result Value Ref Range Status   Specimen Description   Final    URINE, CATHETERIZED Performed at [facility], [address], [place], [place] [address]    Special Requests   Final    NONE Performed at [facility], [address] [place], [place] [address]    Culture (A)  Final    >=100,000 COLONIES/mL ESCHERICHIA COLI SUSCEPTIBILITIES TO FOLLOW Performed at [facility], [place] [address] [place], [place] [address]    Report Status PENDING  Incomplete  Culture, blood (routine x 2)     Status: None (Preliminary result)   Collection Time: 10/04/17 12:15 PM  Result Value Ref Range Status   Specimen Description BLOOD LEFT ARM  Final   Special Requests   Final    BOTTLES DRAWN AEROBIC AND ANAEROBIC Blood Culture adequate volume   Culture   Final    NO GROWTH < 24 HOURS Performed at [facility], [address], [place], [place] [address]    Report Status PENDING  Incomplete  Culture, blood (routine x 2)     Status: None (Preliminary result)   Collection Time: 10/04/17 12:23 PM  Result Value Ref Range Status   Specimen Description BLOOD LEFT WRIST  Final   Special Requests   Final     BOTTLES DRAWN AEROBIC ONLY Blood Culture results may not be optimal due to an inadequate volume of blood received in culture bottles   Culture   Final    NO GROWTH < 24 HOURS Performed at [facility], [address], [place], [place] [address]    Report Status PENDING  Incomplete  MRSA PCR Screening     Status: None   Collection Time: 10/04/17  4:30 PM  Result Value Ref Range Status   MRSA by PCR NEGATIVE NEGATIVE Final    Comment:        The GeneXpert MRSA Assay (FDA approved for NASAL specimens only), is one component of a comprehensive MRSA colonization surveillance program. It is not intended to diagnose MRSA infection nor to guide or monitor treatment for MRSA infections. Performed at [facility], [address], [place], [place] [address]  Studies: Dg Chest Port 1 View  Result Date: 10/04/2017 CLINICAL DATA:  Dyspnea EXAM: PORTABLE CHEST 1 VIEW COMPARISON:  07/24/2017 chest radiograph. FINDINGS: Stable cardiomediastinal silhouette with mild cardiomegaly. No pneumothorax. No pleural effusion. Patchy consolidation in mid to lower parahilar right lung is new. IMPRESSION: New patchy mid to lower right parahilar lung consolidation with mild cardiomegaly. Differential includes pneumonia or asymmetric pulmonary edema. Electronically Signed   By: Ilona Sorrel M.D.   On: 10/04/2017 10:55   Scheduled Meds: . buPROPion  150 mg Oral BID  . gabapentin  300 mg Oral TID  . guaiFENesin  600 mg Oral BID  . insulin aspart  0-15 Units Subcutaneous TID WC  . insulin aspart  0-5 Units Subcutaneous QHS  . insulin aspart  5 Units Subcutaneous TID WC  . insulin glargine  15 Units Subcutaneous [id number]  . ipratropium  0.5 mg Nebulization Q6H  . levalbuterol  0.63 mg Nebulization Q6H  . metoprolol tartrate  25 mg Oral BID  . pantoprazole  40 mg Oral Daily  . predniSONE  5 mg Oral Daily  . QUEtiapine  25 mg Oral QHS  . Rivaroxaban  15 mg Oral Daily  . rosuvastatin  20 mg Oral QPM   Continuous  Infusions: . 0.9 % NaCl with KCl 20 mEq / L 70 mL/hr at 10/06/17 0119  . ceFEPime (MAXIPIME) IV 1 g (10/06/17 4742)  . diltiazem (CARDIZEM) infusion 15 mg/hr (10/06/17 0014)    Active Problems:   Hypertension   Rheumatoid arthritis ([place])   Gastroesophageal reflux disease   Chronic atrial fibrillation (H[facility])   Type II diabetes mellitus (HCC)   Hyperlipidemia   Chronic diastolic (congestive) heart failure (H[facility])   Atrial fibrillation with RVR ([place])   Sepsis ([place])   HCAP (healthcare-associated pneumonia)   Acute lower UTI  Critical Care Time spent: 79 minutes  Irwin Brakeman, MD, FAAFP [facility] Pager [phone]  If 7PM-7AM, please contact night-coverage [email or web address] Password [email or web address] 10/06/2017, 6:24 AM    LOS: 2 days

## 2017-10-06 NOTE — Consult Note (Addendum)
Cardiology Consult    Patient ID: [name]; [id number]; [birth year]   Admit date: 10/04/2017 Date of Consult: 10/06/2017  Primary Care Provider: Aletha Halim., PA-C Primary Cardiologist: Kirk Ruths, MD   Patient Profile    [name] is a 72 y.o. female with past medical history of permanent atrial fibrillation (on Xarelto for anticoagulation), chronic diastolic CHF, HTN, HLD, Stage 3 CKD, and prior CVA who is being seen today for the evaluation of atrial fibrillation with RVR at the request of Dr. Wynetta Emery.   History of Present Illness    [name] was most recently admitted to [facility] from 07/24/2017 to 07/27/2017 for evaluation of altered mental status, atrial fibrillation with RVR, and elevated LFT's. Cardiology was consulted during admission to assist with rate-control as she initially required IV Cardizem but was transitioned to Cardizem CD 360mg  daily and Lopressor 100mg  BID later in her hospital stay. Weight was stable at 175 lbs at discharge and she was continued on Lasix 40mg  daily.   She presented back to [facility] ED on 10/04/2017 for evaluation of worsening dyspnea, fever, and productive cough over the past 2-3 days. HR was elevated into the 180's by EMS arrival and Adenosine was administered with no improvement in her HR. A Cardizem bolus was administered while in the ED with some improvement with HR into the 120's. Was febrile with temp of 103. Initial labs showed WBC 6.8, Hgb 12.3, platelets 198, Na+ 132, K+ 3.7, and creatinine 0.98. Initial troponin negative. BNP 216. UA consistent with UTI and urine culture positive for UTI. Lactic Acid elevated to 3.0. CXR showing new patchy right parahilar lung consolidation with mild cardiomegaly thought to represent PNA or pulmonary edema. EKG showed atrial fibrillation with RVR, HR 214.  She has been admitted for further management of sepsis secondary to PNA and UTI. Has been started on Cefepime and Doxycycline. Cardiology  consulted to assist with management of atrial fibrillation with RVR.   She is currently on IV Cardizem 15 mg/hr and HR was elevated in the 150's overnight but now improved into the 110's to 120's. She had been receiving IVF since admission but repeat CXR this AM showed multifocal PNA and she was experiencing worsening dyspnea, therefore IVF were discontinued and she has been started on IV Lasix 60mg  BID.  In talking with the patient, she reports being at [facility] prior to admission and had been making great progress as she was now ambulating with a walker. Over the past few days leading to admission, she experienced progressive dyspnea and weakness. Was unaware of her elevated HR and denies any palpitations. No recent orthopnea, PND, or lower extremity edema.      Past Medical History:  Diagnosis Date  . Abnormal LFTs 07/24/2017  . Actinic keratosis   . Anemia    hx of  . Anemia of chronic renal failure, stage 3 (moderate) ([place]) 07/17/2015  . Anticoagulant causing adverse effect in therapeutic use 07/17/2015  . Anxiety   . Arthritis    rheumatoid  . Atrial fibrillation ([place])    a. persistent, she remains on Xarelto  . Carpal tunnel syndrome   . Chronic diastolic (congestive) heart failure ([place])    a. 04/2016: echo showing a preserved EF of 55-60% with mild MR. LA and RA midly dilated.   . Edema    left leg at ankle resolved now  . Gastroesophageal reflux disease   . Hepatitis 1970   not sure what  kind  . Hiatal hernia   . Hyperlipidemia   . Hypertension   . Iron deficiency anemia due to chronic blood loss 07/17/2015  . Jaundice    age 56  . Peripheral vascular disease ([place]) yrs ago   DVT left lower leg questionale told by 2 drs i had no clot, 1 md said i did  . Rheumatoid arthritis(714.0)   . Right knee pain   . Stroke ([place])   . Type II diabetes mellitus ([place])    type2    Past Surgical History:  Procedure Laterality Date  . CATARACT EXTRACTION Bilateral   .  COLONOSCOPY WITH PROPOFOL N/A 02/20/2015   Procedure: COLONOSCOPY WITH PROPOFOL;  Surgeon: Carol Ada, MD;  Location: [facility] ENDOSCOPY;  Service: Endoscopy;  Laterality: N/A;  . ENTEROSCOPY N/A 03/23/2016   Procedure: ENTEROSCOPY;  Surgeon: Carol Ada, MD;  Location: [facility] ENDOSCOPY;  Service: Endoscopy;  Laterality: N/A;  . ENTEROSCOPY N/A 06/17/2016   Procedure: ENTEROSCOPY;  Surgeon: Carol Ada, MD;  Location: [facility] END[facility]COPY;  Service: Endoscopy;  Laterality: N/A;  . ESOPHAGOGASTRODUODENOSCOPY (EGD) WITH PROPOFOL N/A 02/20/2015   Procedure: ESOPHAGOGASTRODUODENOSCOPY (EGD) WITH PROPOFOL;  Surgeon: Carol Ada, MD;  Location: [facility] ENDOS[facility];  Service: Endoscopy;  Laterality: N/A;  . GIVENS CAPSULE STUDY N/A 03/21/2016   Procedure: GIVENS CAPSULE STUDY;  Surgeon: Carol Ada, MD;  Location: [facility] ENDOSCOPY;  Service: Endoscopy;  Laterality: N/A;  . HAND SURGERY  1995 and 1996   artificial joints both hands  . JOINT REPLACEMENT    . LUMBAR LAMINECTOMY/DECOMPRESSION MICRODISCECTOMY N/A 02/10/2016   Procedure: MICROLUMBAR DECOMPRESSION L4-L5 AND L3- L4, AND EXCISION OF SYNOVIAL CYST L4-L5;  Surgeon: Susa Day, MD;  Location: [facility]S;  Service: Orthopedics;  Laterality: N/A;  . TOTAL HIP ARTHROPLASTY Left 2009  . TOTAL HIP ARTHROPLASTY Right 04/03/2013   Procedure: RIGHT TOTAL HIP ARTHROPLASTY ANTERIOR APPROACH;  Surgeon: Gearlean Alf, MD;  Location: [facility];  Service: Orthopedics;  Laterality: Right;     Home Medications:  Prior to Admission medications   Medication Sig Start Date End Date Taking? Authorizing Provider  alum & mag hydroxide-simeth (MAALOX/MYLANTA) 200-200-20 MG/5ML suspension Take 30 mLs by mouth every 6 (six) hours as needed for indigestion or heartburn.   Yes [provider]  buPROPion (WELLBUTRIN SR) 150 MG 12 hr tablet Take 150 mg by mouth 2 (two) times daily.     Yes [provider]  Cholecalciferol (VITAMIN D3) 1000 units CAPS Take 1,000 Units by mouth  every evening.    Yes [provider]  diltiazem (CARDIZEM CD) 360 MG 24 hr capsule Take 1 capsule (360 mg total) by mouth daily for 2 days. 07/27/17 10/04/17 Yes Bonnita Hollow, MD  esomeprazole (NEXIUM) 40 MG capsule Take 40 mg by mouth every morning.    Yes [provider]  ferrous gluconate (FERGON) 324 MG tablet Take 1 tablet (324 mg total) by mouth 2 (two) times daily with a meal. 05/17/16  Yes Rizwan, Eunice Blase, MD  furosemide (LASIX) 20 MG tablet Take 20 mg by mouth every evening. Patient takes 1 tablet every evening for 1 week; started 10/03/17   Yes [provider]  furosemide (LASIX) 40 MG tablet Take 40 mg by mouth daily.    Yes [provider]  gabapentin (NEURONTIN) 100 MG capsule Take 1 capsule (100 mg total) by mouth at bedtime. Patient taking differently: Take 300 mg by mouth 3 (three) times daily.  07/04/17  Yes Love, Ivan Anchors, PA-C  glimepiride (AMARYL) 2  MG tablet TAKE 1 TABLET DAILY BEFORE BREAKFAST 07/31/17  Yes [provider]  guaiFENesin-dextromethorphan (ROBITUSSIN DM) 100-10 MG/5ML syrup Take 5 mLs by mouth every 4 (four) hours as needed for cough.   Yes [provider]  leflunomide (ARAVA) 10 MG tablet Take 10 mg by mouth daily.   Yes [provider]  loratadine (CLARITIN) 10 MG tablet Take 10 mg by mouth daily as needed for allergies.   Yes [provider]  meclizine (ANTIVERT) 12.5 MG tablet Take 12.5 mg by mouth daily as needed for dizziness.   Yes [provider]  metFORMIN (GLUCOPHAGE-XR) 500 MG 24 hr tablet Take 500 mg by mouth 2 (two) times daily.  07/31/17  Yes [provider]  metoprolol tartrate (LOPRESSOR) 50 MG tablet TAKE 2 TABLETS (100 MG TOTAL) BY MOUTH 2 (TWO) TIMES DAILY. 08/22/17  Yes Kilroy, Luke K, PA-C  Multiple Vitamins-Minerals (PRESERVISION AREDS 2) CAPS Take 1 capsule by mouth daily.    Yes [provider]  nitroGLYCERIN (NITROSTAT) 0.4 MG SL tablet Place 1  tablet (0.4 mg total) under the tongue every 5 (five) minutes as needed for chest pain (x 3 doses). 11/28/16  Yes Lelon Perla, MD  Omega-3 1000 MG CAPS Take 1 capsule by mouth daily.   Yes [provider]  ondansetron (ZOFRAN-ODT) 4 MG disintegrating tablet Take 4 mg by mouth every 4 (four) hours as needed for nausea or vomiting.   Yes [provider]  OXYGEN Inhale 2 L into the lungs continuous.   Yes [provider]  potassium chloride SA (K-DUR,KLOR-CON) 20 MEQ tablet Take 1 tablet (20 mEq total) by mouth daily. Patient taking differently: Take 20 mEq by mouth 2 (two) times daily.  07/05/17  Yes Love, Ivan Anchors, PA-C  predniSONE (DELTASONE) 5 MG tablet Take 5 mg by mouth daily.   Yes [provider]  QUEtiapine (SEROQUEL) 25 MG tablet Take 1 tablet (25 mg total) by mouth 3 (three) times daily as needed (delerium or aggitation). Patient taking differently: Take 25 mg by mouth at bedtime.  07/27/17  Yes Winfrey, Alcario Drought, MD  Rivaroxaban (XARELTO) 15 MG TABS tablet Take 1 tablet (15 mg total) by mouth daily. 07/28/17  Yes Winfrey, Alcario Drought, MD  rosuvastatin (CRESTOR) 20 MG tablet Take 1 tablet (20 mg total) by mouth every evening. 08/03/17  Yes Venancio Poisson, NP  sennosides-docusate sodium (SENOKOT-S) 8.6-50 MG tablet Take 1 tablet by mouth daily.   Yes [provider]  vitamin B-12 1000 MCG tablet Take 1 tablet (1,000 mcg total) by mouth every evening. Patient taking differently: Take 2,000 mcg by mouth every evening.  07/05/17  Yes Love, Ivan Anchors, PA-C  VOLTAREN 1 % GEL Apply 2 g topically at bedtime as needed for pain. Knees, calf 04/27/17  Yes [provider]  Abatacept (ORENCIA CLICKJECT) 161 MG/ML SOAJ Inject 125 mg into the skin every Thursday.     [provider]  amoxicillin-clavulanate (AUGMENTIN) 875-125 MG tablet Take 1 tablet by mouth 2 (two) times daily.    [provider]    Inpatient Medications: Scheduled  Meds: . buPROPion  150 mg Oral BID  . doxycycline  100 mg Oral Q12H  . furosemide  60 mg Intravenous Q12H  . gabapentin  300 mg Oral TID  . guaiFENesin  1,200 mg Oral BID  . insulin aspart  0-15 Units Subcutaneous TID WC  . insulin aspart  0-5 Units Subcutaneous QHS  . insulin aspart  4  Units Subcutaneous TID WC  . [START ON 10/07/2017] insulin glargine  12 Units Subcutaneous BH-q7a  . ipratropium  0.5 mg Nebulization Q4H  . levalbuterol  0.63 mg Nebulization Q4H  . methylPREDNISolone (SOLU-MEDROL) injection  20 mg Intravenous Q12H  . metoprolol tartrate  25 mg Oral BID  . pantoprazole  40 mg Oral Daily  . potassium chloride  60 mEq Oral BID  . QUEtiapine  25 mg Oral QHS  . Rivaroxaban  20 mg Oral Daily  . rosuvastatin  20 mg Oral QPM   Continuous Infusions: . ceFEPime (MAXIPIME) IV Stopped (10/06/17 9937)  . diltiazem (CARDIZEM) infusion 15 mg/hr (10/06/17 0651)   PRN Meds: acetaminophen **OR** acetaminophen, guaiFENesin-dextromethorphan, levalbuterol, ondansetron **OR** ondansetron (ZOFRAN) IV  Allergies:   No Known Allergies  Social History:   Social History   Socioeconomic History  . Marital status: Divorced    Spouse name: Not on file  . Number of children: Not on file  . Years of education: Not on file  . Highest education level: Not on file  Occupational History  . Occupation: Retired  Scientific laboratory technician  . Financial resource strain: Not on file  . Food insecurity:    Worry: Not on file    Inability: Not on file  . Transportation needs:    Medical: Not on file    Non-medical: Not on file  Tobacco Use  . Smoking status: Never Smoker  . Smokeless tobacco: Never Used  Substance and Sexual Activity  . Alcohol use: No    Alcohol/week: 0.0 oz  . Drug use: No  . Sexual activity: Not Currently  Lifestyle  . Physical activity:    Days per week: Not on file    Minutes per session: Not on file  . Stress: Not on file  Relationships  . Social connections:    Talks on  phone: Not on file    Gets together: Not on file    Attends religious service: Not on file    Active member of club or organization: Not on file    Attends meetings of clubs or organizations: Not on file    Relationship status: Not on file  . Intimate partner violence:    Fear of current or ex partner: Not on file    Emotionally abused: Not on file    Physically abused: Not on file    Forced sexual activity: Not on file  Other Topics Concern  . Not on file  Social History Narrative  . Not on file     Family History:    Family History  Problem Relation Age of Onset  . Pneumonia Mother 19  . Stroke Father 62  . Heart failure Sister   . Hypertension Sister   . Heart attack Neg Hx       Review of Systems    General:  No chills, fever, night sweats or weight changes. Positive for fatigue.  Cardiovascular:  No chest pain, edema, orthopnea, palpitations, paroxysmal nocturnal dyspnea. Positive for dyspnea on exertion.  Dermatological: No rash, lesions/masses Respiratory: No cough, dyspnea Urologic: No hematuria, dysuria Abdominal:   No nausea, vomiting, diarrhea, bright red blood per rectum, melena, or hematemesis Neurologic:  No visual changes, wkns, changes in mental status. All other systems reviewed and are otherwise negative except as noted above.  Physical Exam/Data    Vitals:   10/06/17 0630 10/06/17 0700 10/06/17 0751 10/06/17 0919  BP:  (!) 142/86  (!) 143/114  Pulse:  (!) 143  [name])  147  Resp:  (!) 32    Temp:  (!) 100.6 F (38.1 C) 98.8 F (37.1 C)   TempSrc:      SpO2: 95% 93%    Weight:      Height:        Intake/Output Summary (Last 24 hours) at 10/06/2017 1014 Last data filed at 10/06/2017 0852 Gross per 24 hour  Intake 2545.08 ml  Output 3650 ml  Net -1104.92 ml   Filed Weights   10/04/17 1022 10/05/17 0500 10/06/17 0300  Weight: 164 lb (74.4 kg) 171 lb 8.3 oz (77.8 kg) 181 lb (82.1 kg)   Body mass index is 28.35 kg/m.   General: Pleasant,  elderly Caucasian female appearing in NAD Psych: Normal affect. Neuro: Alert and oriented X 3. Moves all extremities spontaneously. HEENT: Normal  Neck: Supple without bruits or JVD. Lungs:  Resp regular and unlabored, rhonchi throughout with decreased breath sounds along bases bilaterally . Heart: Tachycardiac, irregularly irregular. no s3, s4, or murmurs. Abdomen: Soft, non-tender, non-distended, BS + x 4.  Extremities: No clubbing or cyanosis. Trace edema. DP/PT/Radials 2+ and equal bilaterally.  Labs/Studies     Relevant CV Studies:  Echocardiogram: 07/24/2017 Study Conclusions  - Left ventricle: The cavity size was normal. Systolic function was   normal. The estimated ejection fraction was in the range of 50%   to 55%. EF may be underestimated due to underlying atrial   fibrillation with intermittent RVR Wall motion was normal; there   were no regional wall motion abnormalities. The study was not   technically sufficient to allow evaluation of LV diastolic   dysfunction due to atrial fibrillation. - Aortic valve: Severe diffuse calcification involving the   noncoronary cusp. - Mitral valve: Calcified annulus. There was mild regurgitation. - Pulmonary arteries: PA peak pressure: 36 mm Hg (S).  Impressions:  - The right ventricular systolic pressure was increased consistent   with mild pulmonary hypertension.  Laboratory Data:  Chemistry Recent Labs  Lab 10/04/17 1045 10/05/17 0418 10/06/17 0614  NA 132* 141 144  K 3.7 3.1* 4.3  CL 97* 111 113*  CO2 21* 19* 23  GLUCOSE 187* 342* 73  BUN 23 28* 22  CREATININE 0.98 0.88 0.70  CALCIUM 8.6* 7.6* 8.5*  GFRNONAA 56* >60 >60  GFRAA >60 >60 >60  ANIONGAP 14 11 8     No results for input(s): PROT, ALBUMIN, AST, ALT, ALKPHOS, BILITOT in the last 168 hours. Hematology Recent Labs  Lab 10/04/17 1045 10/05/17 0418 10/06/17 0614  WBC 6.8 6.3 10.8*  RBC 4.77 3.84* 3.84*  HGB 12.3 9.9* 10.0*  HCT 39.2 31.9* 32.4*   MCV 82.2 83.1 84.4  MCH 25.8* 25.8* 26.0  MCHC 31.4 31.0 30.9  RDW 16.6* 16.9* 17.4*  PLT 198 163 222   Cardiac Enzymes Recent Labs  Lab 10/04/17 1045  TROPONINI <0.03   No results for input(s): TROPIPOC in the last 168 hours.  BNP Recent Labs  Lab 10/04/17 1045  BNP 216.0*    DDimer No results for input(s): DDIMER in the last 168 hours.  Radiology/Studies:  Dg Chest Port 1 View  Result Date: 10/06/2017 CLINICAL DATA:  Congestion and hypertension EXAM: PORTABLE CHEST 1 VIEW COMPARISON:  October 04, 2017 FINDINGS: There is airspace consolidation throughout portions of the right upper and lower lobes. There is new patchy infiltrate in the left perihilar region. Heart is mildly enlarged, stable. There is pulmonary venous hypertension. No adenopathy. There is degenerative change in each shoulder. IMPRESSION:  Pulmonary vascular congestion. Airspace consolidation throughout much of the right upper lobe, slightly increased from recent study. Patchy infiltrate right lower lobe, marginally less prominent than on recent study. New left perihilar opacity, felt to represent a small focus of pneumonia. There is felt to be multifocal pneumonia currently. Stable cardiac silhouette. Electronically Signed   By: Lowella Grip III M.D.   On: 10/06/2017 07:10   Dg Chest Port 1 View  Result Date: 10/04/2017 CLINICAL DATA:  Dyspnea EXAM: PORTABLE CHEST 1 VIEW COMPARISON:  07/24/2017 chest radiograph. FINDINGS: Stable cardiomediastinal silhouette with mild cardiomegaly. No pneumothorax. No pleural effusion. Patchy consolidation in mid to lower parahilar right lung is new. IMPRESSION: New patchy mid to lower right parahilar lung consolidation with mild cardiomegaly. Differential includes pneumonia or asymmetric pulmonary edema. Electronically Signed   By: Ilona Sorrel M.D.   On: 10/04/2017 10:55     Assessment & Plan    1. Atrial Fibrillation with RVR - currently admitted for sepsis secondary to PNA and  UTI with HR initially in the 180's to low-200's with subsequent improvement in her HR after being started on IV Cardizem. HR increased overnight in the setting of worsening respiratory distress but has improved following administration of her AM medications in addition to IV Lasix.  - she was on Cardizem CD 360mg  daily and Lopressor 100mg  BID prior to admission. Currently on IV Cardizem 15 mg/hr with HR in the 110's to 120's. Will titrate PO Lopressor from 25mg  BID to 50mg  BID as BP has improved. Would continue to titrate PO Lopressor and can hopefully switch back to PO Cardizem as her acute illness improves.  - she denies any evidence of active bleeding. Hgb stable at 10.0 this AM. Remains on Xarelto for anticoagulation (was reduced to 15mg  daily in 07/2017 due to her AKI but given subsequent normalization of her kidney function, has been adjusted to 20mg  daily).   2. Chronic Diastolic CHF - echo in 82/4235 showed a preserved EF of 50-55% but thought to be underestimated as it was obtained while she was having intermittent atrial fibrillation with RVR.  - BNP was minimally elevated to 216 on admission but she was started on IVF in the setting of sepsis and developed worsening dyspnea this morning. Weight was recorded as 171 lbs yesterday, up to 181 lbs this morning. Baseline of around 175 lbs by hospital discharge in 07/2017. - IVF have been discontinued and agree with the use of IV Lasix at this time. She was on Lasix 40mg  daily prior to admission and can likely be switched back from IV to PO within the next 24-48 hours.   3. HTN - BP has been variable at 81/57 - 144/121 within the past 24 hours. At 143/114 on most recent check. Remains on IV Cardizem at 15 mg/hr. Will plan to titrate Lopressor from 25mg  BID to 50mg  BID for improved HR and BP control.   4. Stage 3 CKD - baseline creatinine 0.9 - 1.0. Stable at 0.70 this AM. Monitor closely with daily BMET while receiving IV Lasix.   5. Sepsis in the  setting of UTI and PNA - Febrile to 103 at the time of admission and lactic acid elevated to 3.0. Urine culture positive for E. Coli and CXR consistent with PNA. - currently on Cefepime and Doxycycline.  - management per admitting team.    For questions or updates, please contact [place] Please consult [email or web address] for contact info under Cardiology/STEMI.  Signed, Erma Heritage, PA-C 10/06/2017,  10:14 AM Pager: [phone]  The patient was seen and examined, and I agree with the history, physical exam, assessment and plan as documented above, with modifications as noted below. I have also personally reviewed all relevant documentation, old records, labs, and both radiographic and cardiovascular studies. I have also independently interpreted old and new ECG's.  Briefly, this is a 72 year old woman who has been hospitalized with UTI and pneumonia whom we are consulted for rapid atrial fibrillation.  I reviewed the initial chest x-ray dated 10/04/2017 which showed new patchy mid to lower right perihilar lung consolidation with mild cardiomegaly.  I compared that to today's x-ray which shows CHF.  There is also multifocal pneumonia.  Lactic acid peaked at 2.3 on 7/3. BNP was mildly elevated at 216.  Panel was normal.  I reviewed the most recent echocardiogram performed on 07/24/2017 which demonstrated normal left ventricular systolic function, EF 50 to 55%.  It was commented that EF may be underestimated due to intermittent rapid atrial fibrillation.  There was severe diffuse calcification involving the noncoronary cusp and mild mitral regurgitation with mild pulmonary hypertension.  I personally reviewed the ECG performed on 10/04/2017 which demonstrated rapid atrial fibrillation, 214 bpm.  She is currently being treated with cefepime and doxycycline.  She is on oral metoprolol tartrate and an IV diltiazem infusion.  Heart rate is currently in the 110-120 bpm range on diltiazem  15 mg/h.  She had been receiving IV fluids but chest x-ray noted above demonstrated CHF and weight is increased from baseline.  She is now on IV Lasix.  She currently denies palpitations.  She feels weak.  I agree with increasing metoprolol tartrate from 25 mg twice daily to 50 mg twice daily as blood pressure appears to be able to tolerate this.  As pneumonia and UTI resolve, IV diltiazem can be transitioned back to long-acting oral diltiazem.  She is on Xarelto for systemic anticoagulation.   Kate Sable, MD, [facility]  10/06/2017 12:20 PM

## 2017-10-07 LAB — BASIC METABOLIC PANEL
Anion gap: 9 (ref 5–15)
BUN: 26 mg/dL — AB (ref 8–23)
CALCIUM: 8.8 mg/dL — AB (ref 8.9–10.3)
CO2: 30 mmol/L (ref 22–32)
CREATININE: 0.75 mg/dL (ref 0.44–1.00)
Chloride: 104 mmol/L (ref 98–111)
GFR calc non Af Amer: >60 mL/min (ref >60)
Glucose, Bld: 266 mg/dL — ABNORMAL HIGH (ref 70–99)
Potassium: 4.5 mmol/L (ref 3.5–5.1)
SODIUM: 143 mmol/L (ref 135–145)

## 2017-10-07 LAB — GLUCOSE, CAPILLARY
GLUCOSE-CAPILLARY: 226 mg/dL — AB (ref 70–99)
GLUCOSE-CAPILLARY: 317 mg/dL — AB (ref 70–99)
Glucose-Capillary: 217 mg/dL — ABNORMAL HIGH (ref 70–99)
Glucose-Capillary: 225 mg/dL — ABNORMAL HIGH (ref 70–99)
Glucose-Capillary: 176 mg/dL — ABNORMAL HIGH (ref 70–99)

## 2017-10-07 LAB — MAGNESIUM: MAGNESIUM: 1.7 mg/dL (ref 1.7–2.4)

## 2017-10-07 MED ORDER — INSULIN GLARGINE 100 UNIT/ML ~~LOC~~ SOLN
18.0000 [IU] | Freq: Every morning | SUBCUTANEOUS | Status: DC
  Administered 2017-10-08: 18 [IU] via SUBCUTANEOUS
  Filled 2017-10-07 (×2): qty 0.18

## 2017-10-07 MED ORDER — DICLOFENAC SODIUM 1 % TD GEL
2.0000 g | Freq: Three times a day (TID) | TRANSDERMAL | Status: DC | PRN
  Administered 2017-10-07 – 2017-10-08 (×3): 2 g via TOPICAL
  Filled 2017-10-07: qty 100

## 2017-10-07 MED ORDER — FUROSEMIDE 40 MG PO TABS
40.0000 mg | ORAL_TABLET | Freq: Every day | ORAL | Status: DC
  Administered 2017-10-07 – 2017-10-10 (×4): 40 mg via ORAL
  Filled 2017-10-07 (×4): qty 1

## 2017-10-07 MED ORDER — INSULIN ASPART 100 UNIT/ML ~~LOC~~ SOLN
5.0000 [IU] | Freq: Three times a day (TID) | SUBCUTANEOUS | Status: DC
  Administered 2017-10-07 (×2): 5 [IU] via SUBCUTANEOUS

## 2017-10-07 MED ORDER — INSULIN ASPART 100 UNIT/ML ~~LOC~~ SOLN
7.0000 [IU] | Freq: Three times a day (TID) | SUBCUTANEOUS | Status: DC
Start: 2017-10-07 — End: 2017-10-09
  Administered 2017-10-07 – 2017-10-08 (×4): 7 [IU] via SUBCUTANEOUS

## 2017-10-07 MED ORDER — LEVALBUTEROL HCL 0.63 MG/3ML IN NEBU
0.6300 mg | INHALATION_SOLUTION | Freq: Four times a day (QID) | RESPIRATORY_TRACT | Status: DC
  Administered 2017-10-08 – 2017-10-10 (×10): 0.63 mg via RESPIRATORY_TRACT
  Filled 2017-10-07 (×10): qty 3

## 2017-10-07 MED ORDER — IPRATROPIUM BROMIDE 0.02 % IN SOLN
0.5000 mg | Freq: Four times a day (QID) | RESPIRATORY_TRACT | Status: DC
  Administered 2017-10-08 – 2017-10-10 (×10): 0.5 mg via RESPIRATORY_TRACT
  Filled 2017-10-07 (×10): qty 2.5

## 2017-10-07 MED ORDER — DILTIAZEM HCL ER COATED BEADS 180 MG PO CP24
360.0000 mg | ORAL_CAPSULE | Freq: Every day | ORAL | Status: DC
  Administered 2017-10-07 – 2017-10-10 (×4): 360 mg via ORAL
  Filled 2017-10-07 (×4): qty 2

## 2017-10-07 NOTE — Progress Notes (Addendum)
Pharmacy Antibiotic Note  [name] is a 72 y.o. female admitted on 10/04/2017 with pneumonia.  Pharmacy has been consulted for  cefepime  dosing.  Plan:  Cefepime 2g IV q12h Doxycycline 100 mg PO BID x 2 days. Monitor labs, c/s, and patient improvement   Height: 5\' 7"  (170.2 cm) Weight: 181 lb (82.1 kg) IBW/kg (Calculated) : 61.6  Temp (24hrs), Avg:98.5 F (36.9 C), Min:97.2 F (36.2 C), Max:99.9 F (37.7 C)  Recent Labs  Lab 10/04/17 1045  10/04/17 1223 10/04/17 1515 10/04/17 1702 10/04/17 1933 10/04/17 2318 10/05/17 0418 10/06/17 0614 10/07/17 0526  WBC 6.8  --   --   --   --   --   --  6.3 10.8*  --   CREATININE 0.98  --   --   --   --   --   --  0.88 0.70 0.75  LATICACIDVEN  --    < > 2.2* 1.7 1.7 2.3* 2.2*  --   --   --    < > = values in this interval not displayed.    Estimated Creatinine Clearance: 70 mL/min (by C-G formula based on SCr of 0.75 mg/dL).    No Known Allergies  Antimicrobials this admission: Doxycycline 7/5>> cefepime 7/3>>  vancomycin 7/3>>7/4 azithromycin 7/3>>7/3 Ceftriaxone 7/3>> 7/3  Dose adjustments this admission: n/a  Microbiology results: 7/3 BCx2: ngtd 7/3 UCx: E. Coli-sensitive to all 7/3 MRSA PCR: negative    Thank you for allowing pharmacy to be a part of this patient's care.  Margot Ables, PharmD Clinical Pharmacist 10/07/2017 12:04 PM

## 2017-10-07 NOTE — Progress Notes (Signed)
PROGRESS NOTE    [name]  ZDG:[id number]  DOB: 15-Nov-1945  DOA: 10/04/2017 PCP: Aletha Halim., PA-C  Brief Admission Hx: [name] is a 72 y.o. female with medical history significant of atrial fibrillation on anticoagulation, chronic diastolic congestive heart failure, steroid-dependent rheumatoid arthritis, who is currently at a nursing facility for physical therapy, is brought to the hospital with shortness of breath.  Pt was admitted with pneumonia, UTI and Atrial fibrillation with RVR.    MDM/Assessment & Plan:  1. Severe Sepsis. Resolved.  Suspect this is primarily due to pneumonia/UTI.  Lactic acid elevated at 3 on admission.  It is trending down.  Continue supportive therapy.  Blood cultures no growth to date.  Continue intravenous antibiotics.  DC vanc as MRSA screen negative.  Fever has improved.  Continue to monitor hemodynamic status.  2. Atrial fibrillation with rapid ventricular response.  Poorly controlled on diltiazem / metoprolol.  Likely precipitated by underlying infectious process/fever.  Currently on Cardizem infusion and rate is 105-110.  Resumed metoprolol 50 mg BID by cardiology team.  Try to DC IV diltiazem and start home oral cardizem CD 360 daily today.   She is anticoagulated with Xarelto. 3. Hypokalemia - Repleted.  oral replacement ordered.  Magnesium followed.  4. Pulmonary Edema - Resolved now - IV lasix ordered 60 mg IV BID x 3 doses.  5. Multifocal Healthcare associated pneumonia.  Started on vancomycin and cefepime.  Discontinued vancomycin as MRSA PCR is negative.   6. Urinary tract infection.  Urine culture has been sent.  Follow-up and continue current antibiotics.  7. Rheumatoid arthritis.  stress dose steroids started 7/5. 8. Diabetes type 2 uncontrolled, added lantus and prandial insulin. Pt having steroid induced hyperglycemia. Follow closely.  Continue sliding scale insulin.  Hold home oral agents. 9. GERD.  Continue on PPI for GI  protection.  10. Hyperlipidemia.  Continue statin 11. Chronic diastolic congestive heart failure with acute exacerbation - IV lasix ordered and patient diuresed 4.8L. Start oral lasix today. 12. Anemia of chronic disease - monitoring closely.  13. Osteoarthritis of knees - resumed home topical voltaren gel.     DVT prophylaxis: Xarelto Code Status: Full code Family Communication: Informed Lattie Haw over the phone Disposition Plan: Discharge back to nursing facility once medically stabilized Consults called:   Admission status: Inpatient, stepdown  Subjective:   Pt having a productive cough and lots of chest congestion. No chest pain.  Positive palpitations.   Objective: Vitals:   10/07/17 0500 10/07/17 0540 10/07/17 0729 10/07/17 0803  BP: 124/83     Pulse: 90   (!) 127  Resp: (!) 23   (!) 26  Temp: (!) 97.2 F (36.2 C) 97.6 F (36.4 C)  97.9 F (36.6 C)  TempSrc:  Oral    SpO2: 92%  97% (!) 89%  Weight:  82.1 kg (181 lb)    Height:        Intake/Output Summary (Last 24 hours) at 10/07/2017 0900 Last data filed at 10/07/2017 0543 Gross per 24 hour  Intake 1213.33 ml  Output 7350 ml  Net -6136.67 ml   Filed Weights   10/05/17 0500 10/06/17 0300 10/07/17 0540  Weight: 77.8 kg (171 lb 8.3 oz) 82.1 kg (181 lb) 82.1 kg (181 lb)   REVIEW OF SYSTEMS  As per history otherwise all reviewed and reported negative  Exam:  General exam: awake, alert, NAD, cooperative.   Respiratory system: coarse rales heard bilateral and diffuse crackles. No  increased work of breathing. Cardiovascular system: irregularly irregular, tachycardic, No JVD, murmurs, gallops, clicks or pedal edema. Gastrointestinal system: Abdomen is nondistended, soft and nontender. Normal bowel sounds heard. Central nervous system: Alert and oriented. No focal neurological deficits. Extremities: no CCE.  Data Reviewed: Basic Metabolic Panel: Recent Labs  Lab 10/04/17 1045 10/05/17 0418 10/06/17 0614  10/07/17 0526  NA 132* 141 144 143  K 3.7 3.1* 4.3 4.5  CL 97* 111 113* 104  CO2 21* 19* 23 30  GLUCOSE 187* 342* 73 266*  BUN 23 28* 22 26*  CREATININE 0.98 0.88 0.70 0.75  CALCIUM 8.6* 7.6* 8.5* 8.8*  MG 1.4* 2.1 1.9 1.7   Liver Function Tests: No results for input(s): AST, ALT, ALKPHOS, BILITOT, PROT, ALBUMIN in the last 168 hours. No results for input(s): LIPASE, AMYLASE in the last 168 hours. No results for input(s): AMMONIA in the last 168 hours. CBC: Recent Labs  Lab 10/04/17 1045 10/05/17 0418 10/06/17 0614  WBC 6.8 6.3 10.8*  NEUTROABS  --   --  9.3*  HGB 12.3 9.9* 10.0*  HCT 39.2 31.9* 32.4*  MCV 82.2 83.1 84.4  PLT 198 163 222   Cardiac Enzymes: Recent Labs  Lab 10/04/17 1045  TROPONINI <0.03   CBG (last 3)  Recent Labs    10/06/17 2143 10/07/17 0314 10/07/17 0802  GLUCAP 252* 226* 217*   Recent Results (from the past 240 hour(s))  Urine culture     Status: Abnormal   Collection Time: 10/04/17 11:40 AM  Result Value Ref Range Status   Specimen Description   Final    URINE, CATHETERIZED Performed at [facility], [address] [place], [place] [address]    Special Requests   Final    NONE Performed at [facility], [address], [place], [place] [address]    Culture >=100,000 COLONIES/mL ESCHERICHIA COLI (A)  Final   Report Status 10/06/2017 FINAL  Final   Organism ID, Bacteria ESCHERICHIA COLI (A)  Final      Susceptibility   Escherichia coli - MIC*    AMPICILLIN 8 SENSITIVE Sensitive     CEFAZOLIN <=4 SENSITIVE Sensitive     CEFTRIAXONE <=1 SENSITIVE Sensitive     CIPROFLOXACIN <=0.25 SENSITIVE Sensitive     GENTAMICIN <=1 SENSITIVE Sensitive     IMIPENEM <=0.25 SENSITIVE Sensitive     NITROFURANTOIN 32 SENSITIVE Sensitive     TRIMETH/SULFA <=20 SENSITIVE Sensitive     AMPICILLIN/SULBACTAM <=2 SENSITIVE Sensitive     Extended ESBL NEGATIVE Sensitive     * >=100,000 COLONIES/mL ESCHERICHIA COLI  Culture, blood (routine x 2)      Status: None (Preliminary result)   Collection Time: 10/04/17 12:15 PM  Result Value Ref Range Status   Specimen Description BLOOD LEFT ARM  Final   Special Requests   Final    BOTTLES DRAWN AEROBIC AND ANAEROBIC Blood Culture adequate volume   Culture   Final    NO GROWTH 3 DAYS Performed at [facility], [address], [place], [place] [address]    Report Status PENDING  Incomplete  Culture, blood (routine x 2)     Status: None (Preliminary result)   Collection Time: 10/04/17 12:23 PM  Result Value Ref Range Status   Specimen Description BLOOD LEFT WRIST  Final   Special Requests   Final    BOTTLES DRAWN AEROBIC ONLY Blood Culture results may not be optimal due to an inadequate volume of blood received in culture bottles  Culture   Final    NO GROWTH 3 DAYS Performed at [facility], [address], [place], [place] [address]    Report Status PENDING  Incomplete  MRSA PCR Screening     Status: None   Collection Time: 10/04/17  4:30 PM  Result Value Ref Range Status   MRSA by PCR NEGATIVE NEGATIVE Final    Comment:        The GeneXpert MRSA Assay (FDA approved for NASAL specimens only), is one component of a comprehensive MRSA colonization surveillance program. It is not intended to diagnose MRSA infection nor to guide or monitor treatment for MRSA infections. Performed at [facility], [address], [place], [place] [address]     Studies: Dg Chest [facility] 1 View  Result Date: 10/06/2017 CLINICAL DATA:  Congestion and hypertension EXAM: PORTABLE CHEST 1 VIEW COMPARISON:  October 04, 2017 FINDINGS: There is airspace consolidation throughout portions of the right upper and lower lobes. There is new patchy infiltrate in the left perihilar region. Heart is mildly enlarged, stable. There is pulmonary venous hypertension. No adenopathy. There is degenerative change in each shoulder. IMPRESSION: Pulmonary vascular congestion. Airspace consolidation throughout much of the right upper  lobe, slightly increased from recent study. Patchy infiltrate right lower lobe, marginally less prominent than on recent study. New left perihilar opacity, felt to represent a small focus of pneumonia. There is felt to be multifocal pneumonia currently. Stable cardiac silhouette. Electronically Signed   By: Lowella Grip III M.D.   On: 10/06/2017 07:10   Scheduled Meds: . buPROPion  150 mg Oral BID  . diltiazem  360 mg Oral Daily  . furosemide  40 mg Oral Daily  . gabapentin  300 mg Oral TID  . guaiFENesin  1,200 mg Oral BID  . insulin aspart  0-15 Units Subcutaneous TID WC  . insulin aspart  0-5 Units Subcutaneous QHS  . insulin aspart  5 Units Subcutaneous TID WC  . insulin glargine  12 Units Subcutaneous [id number]  . ipratropium  0.5 mg Nebulization Q4H  . levalbuterol  0.63 mg Nebulization Q4H  . methylPREDNISolone (SOLU-MEDROL) injection  20 mg Intravenous Q12H  . metoprolol tartrate  50 mg Oral BID  . pantoprazole  40 mg Oral Daily  . potassium chloride  60 mEq Oral BID  . QUEtiapine  25 mg Oral QHS  . Rivaroxaban  20 mg Oral Daily  . rosuvastatin  20 mg Oral QPM   Continuous Infusions: . ceFEPime (MAXIPIME) IV 2 g (10/07/17 0616)  . diltiazem (CARDIZEM) infusion 10 mg/hr (10/07/17 0253)    Active Problems:   Hypertension   Rheumatoid arthritis ([place])   Gastroesophageal reflux disease   Chronic atrial fibrillation (HCC)   Type II diabetes mellitus (HCC)   Hyperlipidemia   Chronic diastolic (congestive) heart failure (HCC)   Atrial fibrillation with RVR ([place])   Sepsis ([place])   HCAP (healthcare-associated pneumonia)   Acute lower UTI  Critical Care Time spent: 67 minutes  Irwin Brakeman, MD, FAAFP [facility] Pager [phone]  If 7PM-7AM, please contact night-coverage [email or web address] Password [email or web address] 10/07/2017, 9:00 AM    LOS: 3 days

## 2017-10-08 LAB — BASIC METABOLIC PANEL
ANION GAP: 10 (ref 5–15)
BUN: 35 mg/dL — ABNORMAL HIGH (ref 8–23)
CALCIUM: 9.2 mg/dL (ref 8.9–10.3)
CHLORIDE: 104 mmol/L (ref 98–111)
CO2: 27 mmol/L (ref 22–32)
Creatinine, Ser: 0.81 mg/dL (ref 0.44–1.00)
GFR calc non Af Amer: >60 mL/min (ref >60)
GLUCOSE: 262 mg/dL — AB (ref 70–99)
Potassium: 4.5 mmol/L (ref 3.5–5.1)
Sodium: 141 mmol/L (ref 135–145)

## 2017-10-08 LAB — GLUCOSE, CAPILLARY
GLUCOSE-CAPILLARY: 253 mg/dL — AB (ref 70–99)
GLUCOSE-CAPILLARY: 268 mg/dL — AB (ref 70–99)
GLUCOSE-CAPILLARY: 351 mg/dL — AB (ref 70–99)
Glucose-Capillary: 361 mg/dL — ABNORMAL HIGH (ref 70–99)

## 2017-10-08 LAB — CBC
HEMATOCRIT: 35.4 % — AB (ref 36.0–46.0)
HEMOGLOBIN: 10.7 g/dL — AB (ref 12.0–15.0)
MCH: 25.5 pg — AB (ref 26.0–34.0)
MCHC: 30.2 g/dL (ref 30.0–36.0)
MCV: 84.3 fL (ref 78.0–100.0)
Platelets: 234 10*3/uL (ref 150–400)
RBC: 4.2 MIL/uL (ref 3.87–5.11)
RDW: 17.1 % — ABNORMAL HIGH (ref 11.5–15.5)
WBC: 11 10*3/uL — ABNORMAL HIGH (ref 4.0–10.5)

## 2017-10-08 MED ORDER — SODIUM CHLORIDE 0.9% FLUSH
3.0000 mL | Freq: Two times a day (BID) | INTRAVENOUS | Status: DC
  Administered 2017-10-08 – 2017-10-10 (×4): 3 mL via INTRAVENOUS

## 2017-10-08 MED ORDER — SODIUM CHLORIDE 0.9% FLUSH: 3.0000 mL | INTRAVENOUS | Status: DC | PRN

## 2017-10-08 MED ORDER — METHYLPREDNISOLONE SODIUM SUCC 40 MG IJ SOLR
10.0000 mg | Freq: Every day | INTRAMUSCULAR | Status: DC
Start: 2017-10-08 — End: 2017-10-09
  Administered 2017-10-08: 10 mg via INTRAVENOUS
  Filled 2017-10-08: qty 1

## 2017-10-08 MED ORDER — METOPROLOL TARTRATE 5 MG/5ML IV SOLN
5.0000 mg | INTRAVENOUS | Status: DC | PRN
Start: 2017-10-08 — End: 2017-10-10

## 2017-10-08 NOTE — Progress Notes (Signed)
PROGRESS NOTE    [name]  ZOX:[id number]  DOB: 1945-09-21  DOA: 10/04/2017 PCP: Aletha Halim., PA-C  Brief Admission Hx: [name] is a 72 y.o. female with medical history significant of atrial fibrillation on anticoagulation, chronic diastolic congestive heart failure, steroid-dependent rheumatoid arthritis, who is currently at a nursing facility for physical therapy, is brought to the hospital with shortness of breath.  Pt was admitted with pneumonia, UTI and Atrial fibrillation with RVR.    MDM/Assessment & Plan:  1. Severe Sepsis. Resolved.  Suspect this is primarily due to pneumonia/UTI.  Lactic acid elevated at 3 on admission.  It is trending down.  Continue supportive therapy.  Blood cultures no growth to date.  Continue intravenous antibiotics.  DC vanc as MRSA screen negative.  Fever has improved.  Continue to monitor hemodynamic status.  2. Atrial fibrillation with rapid ventricular response. Pt's HR is better but still on IV diltiazem infusion.  Trying to wean off IV dilitiazem today.   Likely precipitated by underlying infectious process/fever.  Resumed metoprolol 50 mg BID by cardiology team.  Resumed oral cardizem CD 360 mg 7/6.  She is anticoagulated with Xarelto. 3. Hypokalemia - Repleted.  oral replacement ordered.  Magnesium followed.  4. Pulmonary Edema - Resolved now - IV lasix ordered 60 mg IV BID x 3 doses. Restarted home oral lasix daily.  5. Multifocal Healthcare associated pneumonia.  Started on vancomycin and cefepime.  Discontinued vancomycin as MRSA PCR is negative.  Continue cefepime for now.  Cultures no growth to date.  6. E Coli Urinary tract infection - treated fully with cefepime.  7. Rheumatoid arthritis.  stress dose steroids started 7/5.  De-escalating steroids 7/7.  8. Diabetes type 2 uncontrolled, added lantus and prandial insulin. Pt having steroid induced hyperglycemia. Follow closely.  Continue sliding scale insulin.  Hold home oral  agents. 9. GERD.  Continue on PPI for GI protection.  10. Hyperlipidemia.  Continue statin 11. Chronic diastolic congestive heart failure with acute exacerbation - IV lasix ordered and patient diuresed 5.1L. Continue home oral lasix. 12. Anemia of chronic disease - monitoring closely.  13. Osteoarthritis of knees - resumed home topical voltaren gel.     DVT prophylaxis: Xarelto Code Status: Full code Family Communication: no family present, will try by phone Disposition Plan: Discharge back to nursing facility once medically stabilized Consults called:   Admission status: Inpatient, stepdown  Subjective: Pt say that she is feeling better, she was able to sit in chair yesterday and that helped.    Objective: Vitals:   10/08/17 0100 10/08/17 0220 10/08/17 0733 10/08/17 0738  BP: 111/71     Pulse: 89   89  Resp: 20   (!) 21  Temp: 98.6 F (37 C)   (!) 97.5 F (36.4 C)  TempSrc:      SpO2: 97% 99% 97% 99%  Weight:      Height:        Intake/Output Summary (Last 24 hours) at 10/08/2017 1026 Last data filed at 10/08/2017 0933 Gross per 24 hour  Intake 2527 ml  Output 2825 ml  Net -298 ml   Filed Weights   10/05/17 0500 10/06/17 0300 10/07/17 0540  Weight: 77.8 kg (171 lb 8.3 oz) 82.1 kg (181 lb) 82.1 kg (181 lb)   REVIEW OF SYSTEMS  As per history otherwise all reviewed and reported negative  Exam:  General exam: awake, alert, NAD, cooperative.   Respiratory system: coarse rales heard bilateral and  diffuse crackles. No increased work of breathing. Cardiovascular system: irregularly irregular.  No JVD, murmurs, gallops, clicks or pedal edema. Gastrointestinal system: Abdomen is nondistended, soft and nontender. Normal bowel sounds heard. Central nervous system: Alert and oriented. No focal neurological deficits. Extremities: no CCE.  Data Reviewed: Basic Metabolic Panel: Recent Labs  Lab 10/04/17 1045 10/05/17 0418 10/06/17 0614 10/07/17 0526 10/08/17 0609  NA  132* 141 144 143 141  K 3.7 3.1* 4.3 4.5 4.5  CL 97* 111 113* 104 104  CO2 21* 19* 23 30 27   GLUCOSE 187* 342* 73 266* 262*  BUN 23 28* 22 26* 35*  CREATININE 0.98 0.88 0.70 0.75 0.81  CALCIUM 8.6* 7.6* 8.5* 8.8* 9.2  MG 1.4* 2.1 1.9 1.7  --    Liver Function Tests: No results for input(s): AST, ALT, ALKPHOS, BILITOT, PROT, ALBUMIN in the last 168 hours. No results for input(s): LIPASE, AMYLASE in the last 168 hours. No results for input(s): AMMONIA in the last 168 hours. CBC: Recent Labs  Lab 10/04/17 1045 10/05/17 0418 10/06/17 0614 10/08/17 0609  WBC 6.8 6.3 10.8* 11.0*  NEUTROABS  --   --  9.3*  --   HGB 12.3 9.9* 10.0* 10.7*  HCT 39.2 31.9* 32.4* 35.4*  MCV 82.2 83.1 84.4 84.3  PLT 198 163 222 234   Cardiac Enzymes: Recent Labs  Lab 10/04/17 1045  TROPONINI <0.03   CBG (last 3)  Recent Labs    10/07/17 1647 10/07/17 2147 10/08/17 0741  GLUCAP 176* 317* 253*   Recent Results (from the past 240 hour(s))  Urine culture     Status: Abnormal   Collection Time: 10/04/17 11:40 AM  Result Value Ref Range Status   Specimen Description   Final    URINE, CATHETERIZED Performed at [facility], [address], [place], [place] [address]    Special Requests   Final    NONE Performed at [facility], [address] [place], [place] [address]    Culture >=100,000 COLONIES/mL ESCHERICHIA COLI (A)  Final   Report Status 10/06/2017 FINAL  Final   Organism ID, Bacteria ESCHERICHIA COLI (A)  Final      Susceptibility   Escherichia coli - MIC*    AMPICILLIN 8 SENSITIVE Sensitive     CEFAZOLIN <=4 SENSITIVE Sensitive     CEFTRIAXONE <=1 SENSITIVE Sensitive     CIPROFLOXACIN <=0.25 SENSITIVE Sensitive     GENTAMICIN <=1 SENSITIVE Sensitive     IMIPENEM <=0.25 SENSITIVE Sensitive     NITROFURANTOIN 32 SENSITIVE Sensitive     TRIMETH/SULFA <=20 SENSITIVE Sensitive     AMPICILLIN/SULBACTAM <=2 SENSITIVE Sensitive     Extended ESBL NEGATIVE Sensitive     * >=100,000  COLONIES/mL ESCHERICHIA COLI  Culture, blood (routine x 2)     Status: None (Preliminary result)   Collection Time: 10/04/17 12:15 PM  Result Value Ref Range Status   Specimen Description BLOOD LEFT ARM  Final   Special Requests   Final    BOTTLES DRAWN AEROBIC AND ANAEROBIC Blood Culture adequate volume   Culture   Final    NO GROWTH 4 DAYS Performed at [facility], [address] [place], [place] [address]    Report Status PENDING  Incomplete  Culture, blood (routine x 2)     Status: None (Preliminary result)   Collection Time: 10/04/17 12:23 PM  Result Value Ref Range Status   Specimen Description BLOOD LEFT WRIST  Final   Special Requests   Final  BOTTLES DRAWN AEROBIC ONLY Blood Culture results may not be optimal due to an inadequate volume of blood received in culture bottles   Culture   Final    NO GROWTH 4 DAYS Performed at [facility], [address], [place], [place] [address]    Report Status PENDING  Incomplete  MRSA PCR Screening     Status: None   Collection Time: 10/04/17  4:30 PM  Result Value Ref Range Status   MRSA by PCR NEGATIVE NEGATIVE Final    Comment:        The GeneXpert MRSA Assay (FDA approved for NASAL specimens only), is one component of a comprehensive MRSA colonization surveillance program. It is not intended to diagnose MRSA infection nor to guide or monitor treatment for MRSA infections. Performed at [facility], [address], [place], [place] [address]     Studies: No results found. Scheduled Meds: . buPROPion  150 mg Oral BID  . diltiazem  360 mg Oral Daily  . furosemide  40 mg Oral Daily  . gabapentin  300 mg Oral TID  . guaiFENesin  1,200 mg Oral BID  . insulin aspart  0-15 Units Subcutaneous TID WC  . insulin aspart  0-5 Units Subcutaneous QHS  . insulin aspart  7 Units Subcutaneous TID WC  . insulin glargine  18 Units Subcutaneous q morning - 10a  . ipratropium  0.5 mg Nebulization Q6H WA  . levalbuterol  0.63 mg  Nebulization Q6H WA  . methylPREDNISolone (SOLU-MEDROL) injection  10 mg Intravenous Daily  . metoprolol tartrate  50 mg Oral BID  . pantoprazole  40 mg Oral Daily  . QUEtiapine  25 mg Oral QHS  . Rivaroxaban  20 mg Oral Daily  . rosuvastatin  20 mg Oral QPM   Continuous Infusions: . ceFEPime (MAXIPIME) IV 2 g (10/08/17 0620)  . diltiazem (CARDIZEM) infusion 2.5 mg/hr (10/08/17 1011)    Active Problems:   Hypertension   Rheumatoid arthritis (HCC)   Gastroesophageal reflux disease   Chronic atrial fibrillation (HCC)   Type II diabetes mellitus (HCC)   Hyperlipidemia   Chronic diastolic (congestive) heart failure (HCC)   Atrial fibrillation with RVR ([place])   Sepsis ([place])   HCAP (healthcare-associated pneumonia)   Acute lower UTI  Critical Care Time spent: 31 minutes  Irwin Brakeman, MD, FAAFP [facility] Pager [phone]  If 7PM-7AM, please contact night-coverage [email or web address] Password [email or web address] 10/08/2017, 10:26 AM    LOS: 4 days

## 2017-10-09 ENCOUNTER — Institutional Professional Consult (permissible substitution): Payer: Medicare Other | Admitting: Neurology

## 2017-10-09 LAB — CULTURE, BLOOD (ROUTINE X 2)
CULTURE: NO GROWTH
Culture: NO GROWTH
Special Requests: ADEQUATE

## 2017-10-09 LAB — CBC
HEMATOCRIT: 35.6 % — AB (ref 36.0–46.0)
HEMOGLOBIN: 10.7 g/dL — AB (ref 12.0–15.0)
MCH: 25.3 pg — AB (ref 26.0–34.0)
MCHC: 30.1 g/dL (ref 30.0–36.0)
MCV: 84.2 fL (ref 78.0–100.0)
Platelets: 253 10*3/uL (ref 150–400)
RBC: 4.23 MIL/uL (ref 3.87–5.11)
RDW: 16.8 % — ABNORMAL HIGH (ref 11.5–15.5)
WBC: 12.6 10*3/uL — ABNORMAL HIGH (ref 4.0–10.5)

## 2017-10-09 LAB — BASIC METABOLIC PANEL
Anion gap: 9 (ref 5–15)
BUN: 38 mg/dL — AB (ref 8–23)
CHLORIDE: 103 mmol/L (ref 98–111)
CO2: 29 mmol/L (ref 22–32)
CREATININE: 0.76 mg/dL (ref 0.44–1.00)
Calcium: 8.9 mg/dL (ref 8.9–10.3)
GFR calc Af Amer: >60 mL/min (ref >60)
GFR calc non Af Amer: >60 mL/min (ref >60)
GLUCOSE: 277 mg/dL — AB (ref 70–99)
POTASSIUM: 3.8 mmol/L (ref 3.5–5.1)
Sodium: 141 mmol/L (ref 135–145)

## 2017-10-09 LAB — GLUCOSE, CAPILLARY
GLUCOSE-CAPILLARY: 239 mg/dL — AB (ref 70–99)
GLUCOSE-CAPILLARY: 205 mg/dL — AB (ref 70–99)
Glucose-Capillary: 329 mg/dL — ABNORMAL HIGH (ref 70–99)
Glucose-Capillary: 146 mg/dL — ABNORMAL HIGH (ref 70–99)

## 2017-10-09 MED ORDER — INSULIN GLARGINE 100 UNIT/ML ~~LOC~~ SOLN
14.0000 [IU] | Freq: Every morning | SUBCUTANEOUS | Status: DC
  Administered 2017-10-09 – 2017-10-10 (×2): 14 [IU] via SUBCUTANEOUS
  Filled 2017-10-09 (×3): qty 0.14

## 2017-10-09 MED ORDER — PREDNISONE 10 MG PO TABS
5.0000 mg | ORAL_TABLET | Freq: Every day | ORAL | Status: DC
  Administered 2017-10-09 – 2017-10-10 (×2): 5 mg via ORAL
  Filled 2017-10-09 (×2): qty 1

## 2017-10-09 MED ORDER — INSULIN ASPART 100 UNIT/ML ~~LOC~~ SOLN
5.0000 [IU] | Freq: Three times a day (TID) | SUBCUTANEOUS | Status: DC
  Administered 2017-10-09 – 2017-10-10 (×6): 5 [IU] via SUBCUTANEOUS

## 2017-10-09 NOTE — Progress Notes (Addendum)
PROGRESS NOTE    [name]  FOY:[id number]  DOB: 10/02/1945  DOA: 10/04/2017 PCP: Aletha Halim., PA-C  Brief Admission Hx: [name] is a 72 y.o. female with medical history significant of atrial fibrillation on anticoagulation, chronic diastolic congestive heart failure, steroid-dependent rheumatoid arthritis, who is currently at a nursing facility for physical therapy, is brought to the hospital with shortness of breath.  Pt was admitted with pneumonia, UTI and Atrial fibrillation with RVR.    MDM/Assessment & Plan:  1. Severe Sepsis. Resolved.  Suspect this is primarily due to pneumonia/UTI.  Lactic acid elevated at 3 on admission.  It is trending down.  Continue supportive therapy.  Blood cultures no growth to date.  Continue intravenous antibiotics.  DC vanc as MRSA screen negative.  Fever has improved.  Continue to monitor hemodynamic status.  2. Atrial fibrillation with rapid ventricular response. Pt's HR is better and now off the IV diltiazem infusion.  Likely precipitated by underlying infectious process/fever.  Resumed metoprolol 50 mg BID by cardiology team.  Resumed oral cardizem CD 360 mg 7/6.  She is anticoagulated with Xarelto. 3. Hypokalemia - Repleted.  oral replacement ordered.  Magnesium followed.  4. Pulmonary Edema - Resolved now - IV lasix ordered 60 mg IV BID x 3 doses. Restarted home oral lasix daily.  5. Multifocal Healthcare associated pneumonia.  Started on vancomycin and cefepime.  Discontinued vancomycin as MRSA PCR is negative.  Continue cefepime for now.  Cultures no growth to date.  6. E Coli Urinary tract infection - treated fully with cefepime.  7. Leukocytosis - secondary to steroids.  8. Rheumatoid arthritis.  stress dose steroids started 7/5.  De-escalating steroids 7/7.  Resume home prednisone 7/8. 9. Diabetes type 2 uncontrolled, added lantus and prandial insulin. Pt having steroid induced hyperglycemia. Follow closely.  Continue sliding  scale insulin.  Hold home oral agents.  10. GERD.  Continue on PPI for GI protection.  11. Hyperlipidemia.  Continue statin 12. Chronic diastolic congestive heart failure with acute exacerbation - IV lasix ordered and patient diuresed 5.1L. Continue home oral lasix. 13. Anemia of chronic disease - monitoring closely.  14. Osteoarthritis of knees - resumed home topical voltaren gel.     DVT prophylaxis: Xarelto Code Status: Full code Family Communication: no family present, will try by phone Disposition Plan: Discharge back to nursing facility once medically stabilized Consults called:  Cardiology Admission status: Inpatient, stepdown  Subjective: Pt has been feeling better.  She is not having palpitations, chest pain or SOB.    Objective: Vitals:   10/09/17 0300 10/09/17 0400 10/09/17 0500 10/09/17 0600  BP: 124/78 124/77 (!) 141/82 132/83  Pulse: 79 86 89 82  Resp: (!) 22 (!) 25 20 16   Temp:  98.1 F (36.7 C)    TempSrc:  Oral    SpO2: 98% 97% 100% (!) 85%  Weight:  75.7 kg (166 lb 14.2 oz)    Height:        Intake/Output Summary (Last 24 hours) at 10/09/2017 0617 Last data filed at 10/09/2017 0400 Gross per 24 hour  Intake 76.17 ml  Output 3925 ml  Net -3848.83 ml   Filed Weights   10/06/17 0300 10/07/17 0540 10/09/17 0400  Weight: 82.1 kg (181 lb) 82.1 kg (181 lb) 75.7 kg (166 lb 14.2 oz)   REVIEW OF SYSTEMS  As per history otherwise all reviewed and reported negative  Exam:  General exam: awake, alert, NAD, cooperative.   Respiratory system:  coarse rales heard bilateral and diffuse crackles. No increased work of breathing. Cardiovascular system: irregularly irregular.  No JVD, murmurs, gallops, clicks or pedal edema. Gastrointestinal system: Abdomen is nondistended, soft and nontender. Normal bowel sounds heard. Central nervous system: Alert and oriented. No focal neurological deficits. Extremities: no CCE.  Data Reviewed: Basic Metabolic Panel: Recent Labs    Lab 10/04/17 1045 10/05/17 0418 10/06/17 4403 10/07/17 0526 10/08/17 0609 10/09/17 0434  NA 132* 141 144 143 141 141  K 3.7 3.1* 4.3 4.5 4.5 3.8  CL 97* 111 113* 104 104 103  CO2 21* 19* 23 30 27 29   GLUCOSE 187* 342* 73 266* 262* 277*  BUN 23 28* 22 26* 35* 38*  CREATININE 0.98 0.88 0.70 0.75 0.81 0.76  CALCIUM 8.6* 7.6* 8.5* 8.8* 9.2 8.9  MG 1.4* 2.1 1.9 1.7  --   --    Liver Function Tests: No results for input(s): AST, ALT, ALKPHOS, BILITOT, PROT, ALBUMIN in the last 168 hours. No results for input(s): LIPASE, AMYLASE in the last 168 hours. No results for input(s): AMMONIA in the last 168 hours. CBC: Recent Labs  Lab 10/04/17 1045 10/05/17 0418 10/06/17 0614 10/08/17 0609 10/09/17 0434  WBC 6.8 6.3 10.8* 11.0* 12.6*  NEUTROABS  --   --  9.3*  --   --   HGB 12.3 9.9* 10.0* 10.7* 10.7*  HCT 39.2 31.9* 32.4* 35.4* 35.6*  MCV 82.2 83.1 84.4 84.3 84.2  PLT 198 163 222 234 253   Cardiac Enzymes: Recent Labs  Lab 10/04/17 1045  TROPONINI <0.03   CBG (last 3)  Recent Labs    10/08/17 1144 10/08/17 1623 10/08/17 2113  GLUCAP 268* 361* 351*   Recent Results (from the past 240 hour(s))  Urine culture     Status: Abnormal   Collection Time: 10/04/17 11:40 AM  Result Value Ref Range Status   Specimen Description   Final    URINE, CATHETERIZED Performed at [facility], [address] [place], [place] [address]    Special Requests   Final    NONE Performed at [facility], [address], [place], [place] [address]    Culture >=100,000 COLONIES/mL ESCHERICHIA COLI (A)  Final   Report Status 10/06/2017 FINAL  Final   Organism ID, Bacteria ESCHERICHIA COLI (A)  Final      Susceptibility   Escherichia coli - MIC*    AMPICILLIN 8 SENSITIVE Sensitive     CEFAZOLIN <=4 SENSITIVE Sensitive     CEFTRIAXONE <=1 SENSITIVE Sensitive     CIPROFLOXACIN <=0.25 SENSITIVE Sensitive     GENTAMICIN <=1 SENSITIVE Sensitive     IMIPENEM <=0.25 SENSITIVE Sensitive      NITROFURANTOIN 32 SENSITIVE Sensitive     TRIMETH/SULFA <=20 SENSITIVE Sensitive     AMPICILLIN/SULBACTAM <=2 SENSITIVE Sensitive     Extended ESBL NEGATIVE Sensitive     * >=100,000 COLONIES/mL ESCHERICHIA COLI  Culture, blood (routine x 2)     Status: None (Preliminary result)   Collection Time: 10/04/17 12:15 PM  Result Value Ref Range Status   Specimen Description BLOOD LEFT ARM  Final   Special Requests   Final    BOTTLES DRAWN AEROBIC AND ANAEROBIC Blood Culture adequate volume   Culture   Final    NO GROWTH 4 DAYS Performed at [facility], [address] [place],  [address]    Report Status PENDING  Incomplete  Culture, blood (routine x 2)     Status: None (Preliminary result)  Collection Time: 10/04/17 12:23 PM  Result Value Ref Range Status   Specimen Description BLOOD LEFT WRIST  Final   Special Requests   Final    BOTTLES DRAWN AEROBIC ONLY Blood Culture results may not be optimal due to an inadequate volume of blood received in culture bottles   Culture   Final    NO GROWTH 4 DAYS Performed at [facility], [address], [place], [place] [address]    Report Status PENDING  Incomplete  MRSA PCR Screening     Status: None   Collection Time: 10/04/17  4:30 PM  Result Value Ref Range Status   MRSA by PCR NEGATIVE NEGATIVE Final    Comment:        The GeneXpert MRSA Assay (FDA approved for NASAL specimens only), is one component of a comprehensive MRSA colonization surveillance program. It is not intended to diagnose MRSA infection nor to guide or monitor treatment for MRSA infections. Performed at [facility], [address], [place], [place] [address]     Studies: No results found. Scheduled Meds: . buPROPion  150 mg Oral BID  . diltiazem  360 mg Oral Daily  . furosemide  40 mg Oral Daily  . gabapentin  300 mg Oral TID  . guaiFENesin  1,200 mg Oral BID  . insulin aspart  0-15 Units Subcutaneous TID WC  . insulin aspart  0-5 Units Subcutaneous  QHS  . insulin aspart  7 Units Subcutaneous TID WC  . insulin glargine  18 Units Subcutaneous q morning - 10a  . ipratropium  0.5 mg Nebulization Q6H WA  . levalbuterol  0.63 mg Nebulization Q6H WA  . metoprolol tartrate  50 mg Oral BID  . pantoprazole  40 mg Oral Daily  . predniSONE  5 mg Oral Q breakfast  . QUEtiapine  25 mg Oral QHS  . Rivaroxaban  20 mg Oral Daily  . rosuvastatin  20 mg Oral QPM  . sodium chloride flush  3 mL Intravenous Q12H   Continuous Infusions: . ceFEPime (MAXIPIME) IV 2 g (10/09/17 0559)  . diltiazem (CARDIZEM) infusion Stopped (10/08/17 1154)    Active Problems:   Hypertension   Rheumatoid arthritis (HCC)   Gastroesophageal reflux disease   Chronic atrial fibrillation (HCC)   Type II diabetes mellitus (HCC)   Hyperlipidemia   Chronic diastolic (congestive) heart failure (HCC)   Atrial fibrillation with RVR ([place])   Sepsis ([place])   HCAP (healthcare-associated pneumonia)   Acute lower UTI  Critical Care Time spent: 56 minutes  Irwin Brakeman, MD, FAAFP [facility] Pager [phone]  If 7PM-7AM, please contact night-coverage [email or web address] Password [email or web address] 10/09/2017, 6:17 AM    LOS: 5 days

## 2017-10-10 DIAGNOSIS — K219 Gastro-esophageal reflux disease without esophagitis: Secondary | ICD-10-CM | POA: Diagnosis not present

## 2017-10-10 DIAGNOSIS — G5721 Lesion of femoral nerve, right lower limb: Secondary | ICD-10-CM | POA: Diagnosis not present

## 2017-10-10 DIAGNOSIS — Z8249 Family history of ischemic heart disease and other diseases of the circulatory system: Secondary | ICD-10-CM | POA: Diagnosis not present

## 2017-10-10 DIAGNOSIS — Z7901 Long term (current) use of anticoagulants: Secondary | ICD-10-CM | POA: Diagnosis not present

## 2017-10-10 DIAGNOSIS — I10 Essential (primary) hypertension: Secondary | ICD-10-CM | POA: Diagnosis not present

## 2017-10-10 DIAGNOSIS — Z9842 Cataract extraction status, left eye: Secondary | ICD-10-CM | POA: Diagnosis not present

## 2017-10-10 DIAGNOSIS — G5600 Carpal tunnel syndrome, unspecified upper limb: Secondary | ICD-10-CM | POA: Diagnosis not present

## 2017-10-10 DIAGNOSIS — J189 Pneumonia, unspecified organism: Secondary | ICD-10-CM | POA: Diagnosis not present

## 2017-10-10 DIAGNOSIS — E118 Type 2 diabetes mellitus with unspecified complications: Secondary | ICD-10-CM | POA: Diagnosis not present

## 2017-10-10 DIAGNOSIS — I69312 Visuospatial deficit and spatial neglect following cerebral infarction: Secondary | ICD-10-CM | POA: Diagnosis not present

## 2017-10-10 DIAGNOSIS — I5032 Chronic diastolic (congestive) heart failure: Secondary | ICD-10-CM | POA: Diagnosis not present

## 2017-10-10 DIAGNOSIS — E041 Nontoxic single thyroid nodule: Secondary | ICD-10-CM | POA: Diagnosis not present

## 2017-10-10 DIAGNOSIS — R531 Weakness: Secondary | ICD-10-CM | POA: Diagnosis present

## 2017-10-10 DIAGNOSIS — I63411 Cerebral infarction due to embolism of right middle cerebral artery: Secondary | ICD-10-CM | POA: Diagnosis not present

## 2017-10-10 DIAGNOSIS — Z96643 Presence of artificial hip joint, bilateral: Secondary | ICD-10-CM | POA: Diagnosis not present

## 2017-10-10 DIAGNOSIS — N390 Urinary tract infection, site not specified: Secondary | ICD-10-CM | POA: Diagnosis not present

## 2017-10-10 DIAGNOSIS — Z8719 Personal history of other diseases of the digestive system: Secondary | ICD-10-CM | POA: Diagnosis not present

## 2017-10-10 DIAGNOSIS — R270 Ataxia, unspecified: Secondary | ICD-10-CM | POA: Diagnosis not present

## 2017-10-10 DIAGNOSIS — R296 Repeated falls: Secondary | ICD-10-CM | POA: Diagnosis not present

## 2017-10-10 DIAGNOSIS — I639 Cerebral infarction, unspecified: Secondary | ICD-10-CM | POA: Diagnosis not present

## 2017-10-10 DIAGNOSIS — I509 Heart failure, unspecified: Secondary | ICD-10-CM | POA: Diagnosis not present

## 2017-10-10 DIAGNOSIS — I340 Nonrheumatic mitral (valve) insufficiency: Secondary | ICD-10-CM | POA: Diagnosis not present

## 2017-10-10 DIAGNOSIS — I4891 Unspecified atrial fibrillation: Secondary | ICD-10-CM | POA: Diagnosis not present

## 2017-10-10 DIAGNOSIS — M05752 Rheumatoid arthritis with rheumatoid factor of left hip without organ or systems involvement: Secondary | ICD-10-CM | POA: Diagnosis not present

## 2017-10-10 DIAGNOSIS — A419 Sepsis, unspecified organism: Secondary | ICD-10-CM | POA: Diagnosis not present

## 2017-10-10 DIAGNOSIS — I69915 Cognitive social or emotional deficit following unspecified cerebrovascular disease: Secondary | ICD-10-CM | POA: Diagnosis not present

## 2017-10-10 DIAGNOSIS — E119 Type 2 diabetes mellitus without complications: Secondary | ICD-10-CM | POA: Diagnosis not present

## 2017-10-10 DIAGNOSIS — Z79899 Other long term (current) drug therapy: Secondary | ICD-10-CM | POA: Diagnosis not present

## 2017-10-10 DIAGNOSIS — M069 Rheumatoid arthritis, unspecified: Secondary | ICD-10-CM | POA: Diagnosis not present

## 2017-10-10 DIAGNOSIS — I69319 Unspecified symptoms and signs involving cognitive functions following cerebral infarction: Secondary | ICD-10-CM | POA: Diagnosis not present

## 2017-10-10 DIAGNOSIS — R4182 Altered mental status, unspecified: Secondary | ICD-10-CM | POA: Diagnosis not present

## 2017-10-10 DIAGNOSIS — F419 Anxiety disorder, unspecified: Secondary | ICD-10-CM | POA: Diagnosis not present

## 2017-10-10 DIAGNOSIS — I63431 Cerebral infarction due to embolism of right posterior cerebral artery: Secondary | ICD-10-CM | POA: Diagnosis not present

## 2017-10-10 DIAGNOSIS — G8331 Monoplegia, unspecified affecting right dominant side: Secondary | ICD-10-CM | POA: Diagnosis not present

## 2017-10-10 DIAGNOSIS — D649 Anemia, unspecified: Secondary | ICD-10-CM | POA: Diagnosis not present

## 2017-10-10 DIAGNOSIS — D631 Anemia in chronic kidney disease: Secondary | ICD-10-CM | POA: Diagnosis not present

## 2017-10-10 DIAGNOSIS — R269 Unspecified abnormalities of gait and mobility: Secondary | ICD-10-CM | POA: Diagnosis not present

## 2017-10-10 DIAGNOSIS — R41840 Attention and concentration deficit: Secondary | ICD-10-CM | POA: Diagnosis not present

## 2017-10-10 DIAGNOSIS — E559 Vitamin D deficiency, unspecified: Secondary | ICD-10-CM | POA: Diagnosis not present

## 2017-10-10 DIAGNOSIS — Z823 Family history of stroke: Secondary | ICD-10-CM | POA: Diagnosis not present

## 2017-10-10 DIAGNOSIS — I69398 Other sequelae of cerebral infarction: Secondary | ICD-10-CM | POA: Diagnosis not present

## 2017-10-10 DIAGNOSIS — R300 Dysuria: Secondary | ICD-10-CM | POA: Diagnosis not present

## 2017-10-10 DIAGNOSIS — Z9841 Cataract extraction status, right eye: Secondary | ICD-10-CM | POA: Diagnosis not present

## 2017-10-10 DIAGNOSIS — M6281 Muscle weakness (generalized): Secondary | ICD-10-CM | POA: Diagnosis not present

## 2017-10-10 DIAGNOSIS — R41841 Cognitive communication deficit: Secondary | ICD-10-CM | POA: Diagnosis not present

## 2017-10-10 DIAGNOSIS — M5417 Radiculopathy, lumbosacral region: Secondary | ICD-10-CM | POA: Diagnosis not present

## 2017-10-10 DIAGNOSIS — I482 Chronic atrial fibrillation: Secondary | ICD-10-CM | POA: Diagnosis not present

## 2017-10-10 DIAGNOSIS — R278 Other lack of coordination: Secondary | ICD-10-CM | POA: Diagnosis not present

## 2017-10-10 LAB — GLUCOSE, CAPILLARY
GLUCOSE-CAPILLARY: 133 mg/dL — AB (ref 70–99)
Glucose-Capillary: 145 mg/dL — ABNORMAL HIGH (ref 70–99)
Glucose-Capillary: 264 mg/dL — ABNORMAL HIGH (ref 70–99)
Glucose-Capillary: 169 mg/dL — ABNORMAL HIGH (ref 70–99)

## 2017-10-10 LAB — BASIC METABOLIC PANEL
ANION GAP: 9 (ref 5–15)
BUN: 35 mg/dL — ABNORMAL HIGH (ref 8–23)
CALCIUM: 8.7 mg/dL — AB (ref 8.9–10.3)
CO2: 32 mmol/L (ref 22–32)
CREATININE: 0.74 mg/dL (ref 0.44–1.00)
Chloride: 101 mmol/L (ref 98–111)
GFR calc Af Amer: >60 mL/min (ref >60)
Glucose, Bld: 137 mg/dL — ABNORMAL HIGH (ref 70–99)
POTASSIUM: 3.4 mmol/L — AB (ref 3.5–5.1)
Sodium: 142 mmol/L (ref 135–145)

## 2017-10-10 MED ORDER — POTASSIUM CHLORIDE CRYS ER 20 MEQ PO TBCR: 20.0000 meq | EXTENDED_RELEASE_TABLET | Freq: Two times a day (BID) | ORAL | Status: DC

## 2017-10-10 MED ORDER — LEVALBUTEROL HCL 0.63 MG/3ML IN NEBU: 0.6300 mg | INHALATION_SOLUTION | Freq: Four times a day (QID) | RESPIRATORY_TRACT | 12 refills | Status: DC | PRN

## 2017-10-10 MED ORDER — IPRATROPIUM BROMIDE 0.02 % IN SOLN: 0.5000 mg | Freq: Four times a day (QID) | RESPIRATORY_TRACT | 12 refills | Status: DC

## 2017-10-10 MED ORDER — GABAPENTIN 100 MG PO CAPS: 300.0000 mg | ORAL_CAPSULE | Freq: Three times a day (TID) | ORAL | Status: DC

## 2017-10-10 MED ORDER — LEVALBUTEROL HCL 0.63 MG/3ML IN NEBU: 0.6300 mg | INHALATION_SOLUTION | Freq: Four times a day (QID) | RESPIRATORY_TRACT | 12 refills | Status: DC

## 2017-10-10 MED ORDER — METOPROLOL TARTRATE 50 MG PO TABS: 100.0000 mg | ORAL_TABLET | Freq: Two times a day (BID) | ORAL | Status: DC

## 2017-10-10 NOTE — Clinical Social Work Note (Signed)
Facility notified and discharge clinicals sent. Son arranged for family member, [name], to pick patient up.    LCSW signing off.    Sylvain Hasten, Clydene Pugh, LCSW

## 2017-10-10 NOTE — Discharge Instructions (Signed)
1. Follow up with PCP in 1-2 weeks 2. Please obtain BMP/CBC in one week 3. Repeat chest x-ray in 3 to 4 weeks to ensure resolution of pneumonia

## 2017-10-10 NOTE — Progress Notes (Signed)
Report called into Atha Starks, LPN at [facility] at [phone]. Per Education officer, museum, patient's son's girlfriend, Neoma Laming will be coming to pick up the patient. IV's removed, patient tolerated procedure well.

## 2017-10-10 NOTE — NC FL2 (Addendum)
[place] MEDICAID FL2 LEVEL OF CARE SCREENING TOOL     IDENTIFICATION  Patient Name: [name] Birthdate: 1945/12/17 Sex: female Admission Date (Current Location): 10/04/2017  [facility] and [place] Number:  [employer] and Address:  [facility] [address] [place]      Provider Number: [id number]  Attending Physician Name and Address:  Kathie Dike, MD  Relative Name and Phone Number:       Current Level of Care: Hospital Recommended Level of Care: [place] Prior Approval Number:    Date Approved/Denied:   PASRR Number: [id number]  Discharge Plan: SNF    Current Diagnoses: Patient Active Problem List   Diagnosis Date Noted  . Sepsis ([place]) 10/04/2017  . HCAP (healthcare-associated pneumonia) 10/04/2017  . Acute lower UTI 10/04/2017  . Hallucinations 07/25/2017  . Abnormal LFTs 07/24/2017  . Acute on chronic diastolic CHF (congestive heart failure) ([place])   . Elevated LFTs   . AKI (acute kidney injury) ([place])   . Elevated troponin   . Stroke, acute, embolic ([place]) 67/03/4579  . Mitral regurgitation 06/17/2017  . Acute CVA (cerebrovascular accident) ([place])   . Thyroid nodule 06/15/2017  . Atrial fibrillation with RVR ([place]) 06/14/2017  . CVA (cerebral vascular accident) ([place]) 06/14/2017  . Type II diabetes mellitus ([place])   . Peripheral vascular disease ([place])   . Hyperlipidemia   . Hiatal hernia   . Edema   . Chronic diastolic (congestive) heart failure ([place])   . Carpal tunnel syndrome   . Atrial fibrillation ([place])   . Anemia   . Actinic keratosis   . Intractable nausea and vomiting 05/31/2016  . Nausea & vomiting 05/30/2016  . Abnormal chest x-ray 05/30/2016  . Diabetes mellitus with complication ([place])   . Influenza A 05/21/2016  . Chronic atrial fibrillation ([place]) 05/20/2016  . Acute CHF (congestive heart failure) ([place]) 05/20/2016  . History of GI bleed 03/29/2016  . Chronic anticoagulation  03/29/2016  . Symptomatic anemia 03/19/2016  . Spinal stenosis of lumbar region 02/10/2016  . Varicose veins of left leg with edema 09/15/2015  . Preop cardiovascular exam 08/25/2015  . Iron deficiency anemia due to chronic blood loss 07/17/2015  . Anemia of chronic renal failure, stage 3 (moderate) ([place]) 07/17/2015  . Anticoagulant causing adverse effect in therapeutic use 07/17/2015  . Symptomatic bradycardia 02/21/2015  . Bradycardia   . Acute deep vein thrombosis (DVT) of left lower extremity ([place]) 05/06/2014  . Abdominal wall mass of right lower quadrant 07/08/2013  . OA (osteoarthritis) of hip 04/03/2013  . Osteoarthritis of hip 01/18/2013  . Night sweats 09/06/2012  . Chest pain   . Anxiety and depression   . Gastroesophageal reflux disease   . Hepatitis   . Jaundice   . Hypertension 07/01/2010  . Hypercholesterolemia 07/01/2010  . DM2 (diabetes mellitus, type 2) ([place]) 07/01/2010  . Rheumatoid arthritis ([place]) 07/01/2010  . Sinus tachycardia 07/01/2010  . Chest pain, atypical 07/01/2010    Orientation RESPIRATION BLADDER Height & Weight     Self, Time, Situation, Place  Normal Continent Weight: 166 lb 14.2 oz (75.7 kg) Height:  5\' 7"  (170.2 cm)  BEHAVIORAL SYMPTOMS/MOOD NEUROLOGICAL BOWEL NUTRITION STATUS      Continent (heart healthy/carb modified)  AMBULATORY STATUS COMMUNICATION OF NEEDS Skin   Extensive Assist Verbally Normal                       Personal Care Assistance Level  of Assistance  Bathing, Feeding, Dressing Bathing Assistance: Maximum assistance Feeding assistance: Limited assistance Dressing Assistance: Maximum assistance     Functional Limitations Info  Sight, Hearing, Speech Sight Info: Adequate Hearing Info: Adequate Speech Info: Adequate    SPECIAL CARE FACTORS FREQUENCY                       Contractures Contractures Info: Not present    Additional Factors Info  Code Status, Allergies, Psychotropic Code Status Info:  Full Code Allergies Info: NKA Psychotropic Info: Wellbutrin, Seroquel         Current Medications (10/10/2017):  This is the current hospital active medication list Current Facility-Administered Medications  Medication Dose Route Frequency Provider Last Rate Last Dose  . acetaminophen (TYLENOL) tablet 650 mg  650 mg Oral Q6H PRN Kathie Dike, MD   650 mg at 10/07/17 1136   Or  . acetaminophen (TYLENOL) suppository 650 mg  650 mg Rectal Q6H PRN Kathie Dike, MD      . buPROPion (WELLBUTRIN SR) 12 hr tablet 150 mg  150 mg Oral BID Kathie Dike, MD   150 mg at 10/10/17 0846  . ceFEPIme (MAXIPIME) 2 g in sodium chloride 0.9 % 100 mL IVPB  2 g Intravenous Q12H Murlean Iba, MD   Stopped at 10/10/17 0543  . diclofenac sodium (VOLTAREN) 1 % transdermal gel 2 g  2 g Topical TID PRN Wynetta Emery, Clanford L, MD   2 g at 10/08/17 2023  . diltiazem (CARDIZEM CD) 24 hr capsule 360 mg  360 mg Oral Daily Johnson, Clanford L, MD   360 mg at 10/10/17 0846  . furosemide (LASIX) tablet 40 mg  40 mg Oral Daily Johnson, Clanford L, MD   40 mg at 10/10/17 0845  . gabapentin (NEURONTIN) capsule 300 mg  300 mg Oral TID Kathie Dike, MD   300 mg at 10/10/17 0846  . guaiFENesin (MUCINEX) 12 hr tablet 1,200 mg  1,200 mg Oral BID Johnson, Clanford L, MD   1,200 mg at 10/10/17 0845  . guaiFENesin-dextromethorphan (ROBITUSSIN DM) 100-10 MG/5ML syrup 5 mL  5 mL Oral Q4H PRN Johnson, Clanford L, MD   5 mL at 10/09/17 2325  . insulin aspart (novoLOG) injection 0-15 Units  0-15 Units Subcutaneous TID WC Kathie Dike, MD   8 Units at 10/10/17 1232  . insulin aspart (novoLOG) injection 0-5 Units  0-5 Units Subcutaneous QHS Kathie Dike, MD   2 Units at 10/09/17 2326  . insulin aspart (novoLOG) injection 5 Units  5 Units Subcutaneous TID WC Johnson, Clanford L, MD   5 Units at 10/10/17 1233  . insulin glargine (LANTUS) injection 14 Units  14 Units Subcutaneous q morning - 10a Murlean Iba, MD   14 Units  at 10/10/17 1232  . ipratropium (ATROVENT) nebulizer solution 0.5 mg  0.5 mg Nebulization Q6H WA Johnson, Clanford L, MD   0.5 mg at 10/10/17 0734  . levalbuterol (XOPENEX) nebulizer solution 0.63 mg  0.63 mg Nebulization Q6H PRN Kathie Dike, MD      . levalbuterol (XOPENEX) nebulizer solution 0.63 mg  0.63 mg Nebulization Q6H WA Johnson, Clanford L, MD   0.63 mg at 10/10/17 0734  . metoprolol tartrate (LOPRESSOR) injection 5 mg  5 mg Intravenous Q4H PRN Johnson, Clanford L, MD      . metoprolol tartrate (LOPRESSOR) tablet 100 mg  100 mg Oral BID Kathie Dike, MD      . ondansetron (ZOFRAN) tablet 4 mg  4 mg Oral Q6H PRN Kathie Dike, MD       Or  . ondansetron (ZOFRAN) injection 4 mg  4 mg Intravenous Q6H PRN Kathie Dike, MD      . pantoprazole (PROTONIX) EC tablet 40 mg  40 mg Oral Daily Kathie Dike, MD   40 mg at 10/10/17 0845  . predniSONE (DELTASONE) tablet 5 mg  5 mg Oral Q breakfast Johnson, Clanford L, MD   5 mg at 10/10/17 0845  . QUEtiapine (SEROQUEL) tablet 25 mg  25 mg Oral QHS Kathie Dike, MD   25 mg at 10/09/17 2326  . rivaroxaban (XARELTO) tablet 20 mg  20 mg Oral Daily Johnson, Clanford L, MD   20 mg at 10/10/17 0846  . rosuvastatin (CRESTOR) tablet 20 mg  20 mg Oral QPM Kathie Dike, MD   20 mg at 10/09/17 1716  . sodium chloride flush (NS) 0.9 % injection 3 mL  3 mL Intravenous Q12H Johnson, Clanford L, MD   3 mL at 10/10/17 0847  . sodium chloride flush (NS) 0.9 % injection 3 mL  3 mL Intravenous PRN Murlean Iba, MD         Discharge Medications: Please see discharge summary for a list of discharge medications.  Relevant Imaging Results:  Relevant Lab Results:   Additional Information SSN: [id number]  Ihor Gully, LCSW

## 2017-10-10 NOTE — Progress Notes (Signed)
Pharmacy Antibiotic Note  [name] is a 72 y.o. female admitted on 10/04/2017 with pneumonia.  Pharmacy has been consulted for  cefepime  dosing.  Plan:  Continue Cefepime 2g IV q12h Monitor labs, c/s, and patient improvement   Height: 5\' 7"  (170.2 cm) Weight: 166 lb 14.2 oz (75.7 kg) IBW/kg (Calculated) : 61.6  Temp (24hrs), Avg:98.4 F (36.9 C), Min:98.1 F (36.7 C), Max:98.9 F (37.2 C)  Recent Labs  Lab 10/04/17 1045  10/04/17 1223 10/04/17 1515 10/04/17 1702 10/04/17 1933 10/04/17 2318 10/05/17 0418 10/06/17 5638 10/07/17 0526 10/08/17 0609 10/09/17 0434 10/10/17 0557  WBC 6.8  --   --   --   --   --   --  6.3 10.8*  --  11.0* 12.6*  --   CREATININE 0.98  --   --   --   --   --   --  0.88 0.70 0.75 0.81 0.76 0.74  LATICACIDVEN  --    < > 2.2* 1.7 1.7 2.3* 2.2*  --   --   --   --   --   --    < > = values in this interval not displayed.    Estimated Creatinine Clearance: 67.4 mL/min (by C-G formula based on SCr of 0.74 mg/dL).    No Known Allergies  Antimicrobials this admission: Doxy 7/5>>7/5 7/3 cefepime >>  7/3 vancomycin >>7/4 7/3 azithromycin >>7/3 7/3 ceftriaxone>> 7/3  Microbiology results: 7/3 BCx2: ng final 7/3 UCx: E. Coli-sensitive to all 7/3 MRSA PCR: negative  Thank you for allowing pharmacy to be a part of this patient's care.  Pricilla Larsson, [facility] 10/10/2017 1:47 PM

## 2017-10-10 NOTE — Discharge Summary (Signed)
Physician Discharge Summary  [name]:[id number] DOB: 06/18/1945 DOA: 10/04/2017  PCP: [name]., PA-C  Admit date: 10/04/2017 Discharge date: 10/10/2017  Admitted From: Skilled nursing facility Disposition: Skilled nursing facility  Recommendations for Outpatient Follow-up:  1. Follow up with PCP in 1-2 weeks 2. Please obtain BMP/CBC in one week 3. Repeat chest x-ray in 3 to 4 weeks to ensure resolution of pneumonia   Discharge Condition: Stable CODE STATUS: Full code Diet recommendation: Heart healthy, carb modified  Brief/Interim Summary: Christina L Rumleyis a 72 y.o.femalewith medical history significant ofatrial fibrillation on anticoagulation, chronic diastolic congestive heart failure, steroid-dependent rheumatoid arthritis, who is currently at a nursing facility for physical therapy, is brought to the hospital with shortness of breath.  Pt was admitted with pneumonia, UTI and Atrial fibrillation with RVR.      Discharge Diagnoses:  Active Problems:   Hypertension   Rheumatoid arthritis ([place])   Gastroesophageal reflux disease   Chronic atrial fibrillation (HCC)   Type II diabetes mellitus (HCC)   Hyperlipidemia   Chronic diastolic (congestive) heart failure (HCC)   Atrial fibrillation with RVR (HCC)   Sepsis (HCC)   HCAP (healthcare-associated pneumonia)   Acute lower UTI  1. Severe Sepsis. Resolved.  Suspect this is primarily due to pneumonia/UTI. Lactic acid elevated at 3 on admission. This trended down with IV fluid therapy.  Continue supportive therapy.  Blood cultures no growth to date.  She was treated with intravenous cefepime.  Fever has improved. Hemodynamic status currently stable 2. Atrial fibrillation with rapid ventricular response.  She presented with rapid heart rate, secondary to sepsis.  She was treated with intravenous diltiazem.  Pt's HR is better and now off the IV diltiazem infusion.  Likely precipitated by underlying infectious  process/fever.    She has been resumed on oral metoprolol as well as oral Cardizem.  She was followed by cardiology. She is anticoagulated with Xarelto. 3. Hypokalemia - Repleted.  oral replacement ordered.  Magnesium followed.  4. Acute on chronic diastolic congestive heart failure.  Likely precipitated by aggressive IV hydration in the setting of sepsis.- Resolved now - IV lasix ordered 60 mg IV BID x 3 doses. Restarted home oral lasix daily.  5. Multifocal Healthcare associated pneumonia. Started on vancomycin and cefepime. Discontinued vancomycin as MRSA PCR is negative.  She was treated with intravenous cefepime.  Overall respiratory status has stabilized.  She is not having any further fevers.  Will complete course of antibiotics with Augmentin. 6. E Coli Urinary tract infection - treated fully with cefepime.  7. Leukocytosis - secondary to steroids.  8. Rheumatoid arthritis. stress dose steroids started 7/5.  De-escalating steroids 7/7.  Resume home prednisone 7/8. 9. Diabetes type 2 uncontrolled, due to stress dose steroids.  Blood sugars have since been stable.  Resume oral medications on discharge. 10. GERD. Continue on PPI for GI protection.  11. Hyperlipidemia. Continue statin     Discharge Instructions  Discharge Instructions    Diet - low sodium heart healthy   Complete by:  As directed    Increase activity slowly   Complete by:  As directed      Allergies as of 10/10/2017   No Known Allergies     Medication List    TAKE these medications   alum & mag hydroxide-simeth 200-200-20 MG/5ML suspension Commonly known as:  MAALOX/MYLANTA Take 30 mLs by mouth every 6 (six) hours as needed for indigestion or heartburn.   amoxicillin-clavulanate 875-125 MG tablet Commonly  known as:  AUGMENTIN Take 1 tablet by mouth 2 (two) times daily.   buPROPion 150 MG 12 hr tablet Commonly known as:  WELLBUTRIN SR Take 150 mg by mouth 2 (two) times daily.   cyanocobalamin 1000 MCG  tablet Take 1 tablet (1,000 mcg total) by mouth every evening. What changed:  how much to take   diltiazem 360 MG 24 hr capsule Commonly known as:  CARDIZEM CD Take 1 capsule (360 mg total) by mouth daily for 2 days.   esomeprazole 40 MG capsule Commonly known as:  NEXIUM Take 40 mg by mouth every morning.   ferrous gluconate 324 MG tablet Commonly known as:  FERGON Take 1 tablet (324 mg total) by mouth 2 (two) times daily with a meal.   furosemide 40 MG tablet Commonly known as:  LASIX Take 40 mg by mouth daily. What changed:  Another medication with the same name was removed. Continue taking this medication, and follow the directions you see here.   gabapentin 100 MG capsule Commonly known as:  NEURONTIN Take 3 capsules (300 mg total) by mouth 3 (three) times daily.   glimepiride 2 MG tablet Commonly known as:  AMARYL TAKE 1 TABLET DAILY BEFORE BREAKFAST   guaiFENesin-dextromethorphan 100-10 MG/5ML syrup Commonly known as:  ROBITUSSIN DM Take 5 mLs by mouth every 4 (four) hours as needed for cough.   ipratropium 0.02 % nebulizer solution Commonly known as:  ATROVENT Take 2.5 mLs (0.5 mg total) by nebulization every 6 (six) hours.   leflunomide 10 MG tablet Commonly known as:  ARAVA Take 10 mg by mouth daily.   levalbuterol 0.63 MG/3ML nebulizer solution Commonly known as:  XOPENEX Take 3 mLs (0.63 mg total) by nebulization every 6 (six) hours as needed for wheezing or shortness of breath.   levalbuterol 0.63 MG/3ML nebulizer solution Commonly known as:  XOPENEX Take 3 mLs (0.63 mg total) by nebulization every 6 (six) hours.   loratadine 10 MG tablet Commonly known as:  CLARITIN Take 10 mg by mouth daily as needed for allergies.   meclizine 12.5 MG tablet Commonly known as:  ANTIVERT Take 12.5 mg by mouth daily as needed for dizziness.   metFORMIN 500 MG 24 hr tablet Commonly known as:  GLUCOPHAGE-XR Take 500 mg by mouth 2 (two) times daily.   metoprolol  tartrate 50 MG tablet Commonly known as:  LOPRESSOR TAKE 2 TABLETS (100 MG TOTAL) BY MOUTH 2 (TWO) TIMES DAILY.   nitroGLYCERIN 0.4 MG SL tablet Commonly known as:  NITROSTAT Place 1 tablet (0.4 mg total) under the tongue every 5 (five) minutes as needed for chest pain (x 3 doses).   Omega-3 1000 MG Caps Take 1 capsule by mouth daily.   ondansetron 4 MG disintegrating tablet Commonly known as:  ZOFRAN-ODT Take 4 mg by mouth every 4 (four) hours as needed for nausea or vomiting.   ORENCIA CLICKJECT 737 MG/ML Soaj Generic drug:  Abatacept Inject 125 mg into the skin every Thursday.   OXYGEN Inhale 2 L into the lungs continuous.   potassium chloride SA 20 MEQ tablet Commonly known as:  K-DUR,KLOR-CON Take 1 tablet (20 mEq total) by mouth 2 (two) times daily.   predniSONE 5 MG tablet Commonly known as:  DELTASONE Take 5 mg by mouth daily.   PRESERVISION AREDS 2 Caps Take 1 capsule by mouth daily.   QUEtiapine 25 MG tablet Commonly known as:  SEROQUEL Take 1 tablet (25 mg total) by mouth 3 (three) times daily as  needed (delerium or aggitation). What changed:  when to take this   Rivaroxaban 15 MG Tabs tablet Commonly known as:  XARELTO Take 1 tablet (15 mg total) by mouth daily.   rosuvastatin 20 MG tablet Commonly known as:  CRESTOR Take 1 tablet (20 mg total) by mouth every evening.   sennosides-docusate sodium 8.6-50 MG tablet Commonly known as:  SENOKOT-S Take 1 tablet by mouth daily.   Vitamin D3 1000 units Caps Take 1,000 Units by mouth every evening.   VOLTAREN 1 % Gel Generic drug:  diclofenac sodium Apply 2 g topically at bedtime as needed for pain. Knees, calf       No Known Allergies  Consultations:  Cardiology   Procedures/Studies: Dg Chest Port 1 View  Result Date: 10/06/2017 CLINICAL DATA:  Congestion and hypertension EXAM: PORTABLE CHEST 1 VIEW COMPARISON:  October 04, 2017 FINDINGS: There is airspace consolidation throughout portions of the  right upper and lower lobes. There is new patchy infiltrate in the left perihilar region. Heart is mildly enlarged, stable. There is pulmonary venous hypertension. No adenopathy. There is degenerative change in each shoulder. IMPRESSION: Pulmonary vascular congestion. Airspace consolidation throughout much of the right upper lobe, slightly increased from recent study. Patchy infiltrate right lower lobe, marginally less prominent than on recent study. New left perihilar opacity, felt to represent a small focus of pneumonia. There is felt to be multifocal pneumonia currently. Stable cardiac silhouette. Electronically Signed   By: Lowella Grip III M.D.   On: 10/06/2017 07:10   Dg Chest Port 1 View  Result Date: 10/04/2017 CLINICAL DATA:  Dyspnea EXAM: PORTABLE CHEST 1 VIEW COMPARISON:  07/24/2017 chest radiograph. FINDINGS: Stable cardiomediastinal silhouette with mild cardiomegaly. No pneumothorax. No pleural effusion. Patchy consolidation in mid to lower parahilar right lung is new. IMPRESSION: New patchy mid to lower right parahilar lung consolidation with mild cardiomegaly. Differential includes pneumonia or asymmetric pulmonary edema. Electronically Signed   By: Ilona Sorrel M.D.   On: 10/04/2017 10:55       Subjective: Overall she is feeling better.  Shortness of breath is better.  She does have some productive cough.  Discharge Exam: Vitals:   10/10/17 0501 10/10/17 0734  BP: 135/79   Pulse: 80   Resp: 20   Temp: 98.1 F (36.7 C)   SpO2: 93% 93%   Vitals:   10/09/17 2201 10/09/17 2248 10/10/17 0501 10/10/17 0734  BP: 135/62  135/79   Pulse: (!) 49 64 80   Resp: 20  20   Temp: 98.9 F (37.2 C)  98.1 F (36.7 C)   TempSrc: Oral  Oral   SpO2: 96% 99% 93% 93%  Weight:      Height:        General: Pt is alert, awake, not in acute distress Cardiovascular: Irregular S1/S2 +, no rubs, no gallops Respiratory: CTA bilaterally, no wheezing, no rhonchi Abdominal: Soft, NT, ND,  bowel sounds + Extremities: no edema, no cyanosis    The results of significant diagnostics from this hospitalization (including imaging, microbiology, ancillary and laboratory) are listed below for reference.     Microbiology: Recent Results (from the past 240 hour(s))  Urine culture     Status: Abnormal   Collection Time: 10/04/17 11:40 AM  Result Value Ref Range Status   Specimen Description   Final    URINE, CATHETERIZED Performed at [facility], [address], [place], [place] [address]    Special Requests   Final    NONE  Performed at [facility], [address], [place], [place] [address]    Culture >=100,000 COLONIES/mL ESCHERICHIA COLI (A)  Final   Report Status 10/06/2017 FINAL  Final   Organism ID, Bacteria ESCHERICHIA COLI (A)  Final      Susceptibility   Escherichia coli - MIC*    AMPICILLIN 8 SENSITIVE Sensitive     CEFAZOLIN <=4 SENSITIVE Sensitive     CEFTRIAXONE <=1 SENSITIVE Sensitive     CIPROFLOXACIN <=0.25 SENSITIVE Sensitive     GENTAMICIN <=1 SENSITIVE Sensitive     IMIPENEM <=0.25 SENSITIVE Sensitive     NITROFURANTOIN 32 SENSITIVE Sensitive     TRIMETH/SULFA <=20 SENSITIVE Sensitive     AMPICILLIN/SULBACTAM <=2 SENSITIVE Sensitive     Extended ESBL NEGATIVE Sensitive     * >=100,000 COLONIES/mL ESCHERICHIA COLI  Culture, blood (routine x 2)     Status: None   Collection Time: 10/04/17 12:15 PM  Result Value Ref Range Status   Specimen Description BLOOD LEFT ARM  Final   Special Requests   Final    BOTTLES DRAWN AEROBIC AND ANAEROBIC Blood Culture adequate volume   Culture   Final    NO GROWTH 5 DAYS Performed at [facility], [address], [place], [place] [address]    Report Status 10/09/2017 FINAL  Final  Culture, blood (routine x 2)     Status: None   Collection Time: 10/04/17 12:23 PM  Result Value Ref Range Status   Specimen Description BLOOD LEFT WRIST  Final   Special Requests   Final    BOTTLES DRAWN AEROBIC ONLY Blood Culture  results may not be optimal due to an inadequate volume of blood received in culture bottles   Culture   Final    NO GROWTH 5 DAYS Performed at [facility], [address], [place], [place] [address]    Report Status 10/09/2017 FINAL  Final  MRSA PCR Screening     Status: None   Collection Time: 10/04/17  4:30 PM  Result Value Ref Range Status   MRSA by PCR NEGATIVE NEGATIVE Final    Comment:        The GeneXpert MRSA Assay (FDA approved for NASAL specimens only), is one component of a comprehensive MRSA colonization surveillance program. It is not intended to diagnose MRSA infection nor to guide or monitor treatment for MRSA infections. Performed at [facility], [address], [place], [place] [address]      Labs: BNP (last 3 results) Recent Labs    07/24/17 1007 10/04/17 1045  BNP 670.9* 779.3*   Basic Metabolic Panel: Recent Labs  Lab 10/04/17 1045 10/05/17 0418 10/06/17 0614 10/07/17 0526 10/08/17 0609 10/09/17 0434 10/10/17 0557  NA 132* 141 144 143 141 141 142  K 3.7 3.1* 4.3 4.5 4.5 3.8 3.4*  CL 97* 111 113* 104 104 103 101  CO2 21* 19* 23 30 27 29  32  GLUCOSE 187* 342* 73 266* 262* 277* 137*  BUN 23 28* 22 26* 35* 38* 35*  CREATININE 0.98 0.88 0.70 0.75 0.81 0.76 0.74  CALCIUM 8.6* 7.6* 8.5* 8.8* 9.2 8.9 8.7*  MG 1.4* 2.1 1.9 1.7  --   --   --    Liver Function Tests: No results for input(s): AST, ALT, ALKPHOS, BILITOT, PROT, ALBUMIN in the last 168 hours. No results for input(s): LIPASE, AMYLASE in the last 168 hours. No results for input(s): AMMONIA in the last 168 hours. CBC: Recent Labs  Lab 10/04/17 1045 10/05/17 0418  10/06/17 0614 10/08/17 0609 10/09/17 0434  WBC 6.8 6.3 10.8* 11.0* 12.6*  NEUTROABS  --   --  9.3*  --   --   HGB 12.3 9.9* 10.0* 10.7* 10.7*  HCT 39.2 31.9* 32.4* 35.4* 35.6*  MCV 82.2 83.1 84.4 84.3 84.2  PLT 198 163 222 234 253   Cardiac Enzymes: Recent Labs  Lab 10/04/17 1045  TROPONINI <0.03   BNP: Invalid  input(s): POCBNP CBG: Recent Labs  Lab 10/09/17 1646 10/09/17 2203 10/10/17 0457 10/10/17 0754 10/10/17 1159  GLUCAP 146* 205* 133* 145* 264*   D-Dimer No results for input(s): DDIMER in the last 72 hours. Hgb A1c No results for input(s): HGBA1C in the last 72 hours. Lipid Profile No results for input(s): CHOL, HDL, LDLCALC, TRIG, CHOLHDL, LDLDIRECT in the last 72 hours. Thyroid function studies No results for input(s): TSH, T4TOTAL, T3FREE, THYROIDAB in the last 72 hours.  Invalid input(s): FREET3 Anemia work up No results for input(s): VITAMINB12, FOLATE, FERRITIN, TIBC, IRON, RETICCTPCT in the last 72 hours. Urinalysis    Component Value Date/Time   COLORURINE YELLOW 10/04/2017 1140   APPEARANCEUR HAZY (A) 10/04/2017 1140   LABSPEC 1.025 10/04/2017 1140   PHURINE 5.0 10/04/2017 1140   GLUCOSEU NEGATIVE 10/04/2017 1140   HGBUR TRACE (A) 10/04/2017 1140   BILIRUBINUR NEGATIVE 10/04/2017 1140   KETONESUR NEGATIVE 10/04/2017 1140   PROTEINUR 30 (A) 10/04/2017 1140   UROBILINOGEN 1.0 10/09/2014 0040   NITRITE POSITIVE (A) 10/04/2017 1140   LEUKOCYTESUR SMALL (A) 10/04/2017 1140   Sepsis Labs Invalid input(s): PROCALCITONIN,  WBC,  LACTICIDVEN Microbiology Recent Results (from the past 240 hour(s))  Urine culture     Status: Abnormal   Collection Time: 10/04/17 11:40 AM  Result Value Ref Range Status   Specimen Description   Final    URINE, CATHETERIZED Performed at [facility], [address] [place], [place] [address]    Special Requests   Final    NONE Performed at [facility], [address], [place], [place] [address]    Culture >=100,000 COLONIES/mL ESCHERICHIA COLI (A)  Final   Report Status 10/06/2017 FINAL  Final   Organism ID, Bacteria ESCHERICHIA COLI (A)  Final      Susceptibility   Escherichia coli - MIC*    AMPICILLIN 8 SENSITIVE Sensitive     CEFAZOLIN <=4 SENSITIVE Sensitive     CEFTRIAXONE <=1 SENSITIVE Sensitive     CIPROFLOXACIN <=0.25  SENSITIVE Sensitive     GENTAMICIN <=1 SENSITIVE Sensitive     IMIPENEM <=0.25 SENSITIVE Sensitive     NITROFURANTOIN 32 SENSITIVE Sensitive     TRIMETH/SULFA <=20 SENSITIVE Sensitive     AMPICILLIN/SULBACTAM <=2 SENSITIVE Sensitive     Extended ESBL NEGATIVE Sensitive     * >=100,000 COLONIES/mL ESCHERICHIA COLI  Culture, blood (routine x 2)     Status: None   Collection Time: 10/04/17 12:15 PM  Result Value Ref Range Status   Specimen Description BLOOD LEFT ARM  Final   Special Requests   Final    BOTTLES DRAWN AEROBIC AND ANAEROBIC Blood Culture adequate volume   Culture   Final    NO GROWTH 5 DAYS Performed at [facility], [address], [place], [place] [address]    Report Status 10/09/2017 FINAL  Final  Culture, blood (routine x 2)     Status: None   Collection Time: 10/04/17 12:23 PM  Result Value Ref Range Status   Specimen Description BLOOD LEFT WRIST  Final  Special Requests   Final    BOTTLES DRAWN AEROBIC ONLY Blood Culture results may not be optimal due to an inadequate volume of blood received in culture bottles   Culture   Final    NO GROWTH 5 DAYS Performed at [facility], [address], [place], [place] [address]    Report Status 10/09/2017 FINAL  Final  MRSA PCR Screening     Status: None   Collection Time: 10/04/17  4:30 PM  Result Value Ref Range Status   MRSA by PCR NEGATIVE NEGATIVE Final    Comment:        The GeneXpert MRSA Assay (FDA approved for NASAL specimens only), is one component of a comprehensive MRSA colonization surveillance program. It is not intended to diagnose MRSA infection nor to guide or monitor treatment for MRSA infections. Performed at [facility], [address], [place], [place] [address]      Time coordinating discharge: 66mins  SIGNED:   Kathie Dike, MD  [facility] 10/10/2017, 3:01 PM Pager   If 7PM-7AM, please contact night-coverage [email or web address] Password [email or web address]

## 2017-10-10 NOTE — Clinical Social Work Note (Signed)
Clinical Social Work Assessment  Patient Details  Name: [name] MRN: [id number] Date of Birth: 1945-12-14  Date of referral:  10/10/17               Reason for consult:  Facility Placement                Permission sought to share information with:    Permission granted to share information::     Name::        Agency::  Lynnae Sandhoff at [facility]   Relationship::     Contact Information:     Housing/Transportation Living arrangements for the past 2 months:  [place] of Information:  Facility Patient Interpreter Needed:  None Criminal Activity/Legal Involvement Pertinent to Current Situation/Hospitalization:  No - Comment as needed Significant Relationships:  Other Family Members Lives with:  Facility Resident Do you feel safe going back to the place where you live?  Yes Need for family participation in patient care:  Yes (Comment)  Care giving concerns:  Facility resident. None identified.    Social Worker assessment / plan:  Patient is a long term resident at [facility]. She admitted on 06/07/17. Patient receives assistance with ADLs from staff. She transfers to her wheelchair and to the toilet. Patient has family support. Patient confimed stated and is agreeable to return to the facility at discharge.   Employment status:  Retired Forensic scientist:  Medicare PT Recommendations:  Not assessed at this time Information / Referral to community resources:     Patient/Family's Response to care:  Patient is agreeable to return to the facility at discharge.   Patient/Family's Understanding of and Emotional Response to Diagnosis, Current Treatment, and Prognosis:  Patient understands her diagnosis, treatment and prognosis and feels that long term care is most appropriate at this time.   Emotional Assessment Appearance:  Appears stated age Attitude/Demeanor/Rapport:    Affect (typically observed):  Calm Orientation:  Oriented to Situation,  Oriented to  Time, Oriented to Place, Oriented to Self Alcohol / Substance use:  Not Applicable Psych involvement (Current and /or in the community):  No (Comment)  Discharge Needs  Concerns to be addressed:  No discharge needs identified Readmission within the last 30 days:  No Current discharge risk:  None Barriers to Discharge:  No Barriers Identified   Ihor Gully, LCSW 10/10/2017, 3:23 PM

## 2017-10-11 DIAGNOSIS — E559 Vitamin D deficiency, unspecified: Secondary | ICD-10-CM | POA: Diagnosis not present

## 2017-10-11 DIAGNOSIS — I482 Chronic atrial fibrillation: Secondary | ICD-10-CM | POA: Diagnosis not present

## 2017-10-11 DIAGNOSIS — J189 Pneumonia, unspecified organism: Secondary | ICD-10-CM | POA: Diagnosis not present

## 2017-10-11 DIAGNOSIS — E119 Type 2 diabetes mellitus without complications: Secondary | ICD-10-CM | POA: Diagnosis not present

## 2017-10-22 DIAGNOSIS — I509 Heart failure, unspecified: Secondary | ICD-10-CM | POA: Diagnosis not present

## 2017-10-22 DIAGNOSIS — M069 Rheumatoid arthritis, unspecified: Secondary | ICD-10-CM | POA: Diagnosis not present

## 2017-10-22 DIAGNOSIS — J189 Pneumonia, unspecified organism: Secondary | ICD-10-CM | POA: Diagnosis not present

## 2017-10-22 DIAGNOSIS — I482 Chronic atrial fibrillation: Secondary | ICD-10-CM | POA: Diagnosis not present

## 2017-10-23 ENCOUNTER — Ambulatory Visit (HOSPITAL_BASED_OUTPATIENT_CLINIC_OR_DEPARTMENT_OTHER): Payer: Medicare Other | Admitting: Physical Medicine & Rehabilitation

## 2017-10-23 ENCOUNTER — Other Ambulatory Visit: Payer: Self-pay

## 2017-10-23 ENCOUNTER — Encounter: Payer: Self-pay | Admitting: Physical Medicine & Rehabilitation

## 2017-10-23 VITALS — BP 118/66 | HR 110 | Ht 67.0 in | Wt 170.0 lb

## 2017-10-23 DIAGNOSIS — G5721 Lesion of femoral nerve, right lower limb: Secondary | ICD-10-CM | POA: Diagnosis not present

## 2017-10-23 DIAGNOSIS — M5417 Radiculopathy, lumbosacral region: Secondary | ICD-10-CM

## 2017-10-23 DIAGNOSIS — I69312 Visuospatial deficit and spatial neglect following cerebral infarction: Secondary | ICD-10-CM | POA: Diagnosis not present

## 2017-10-23 DIAGNOSIS — I4891 Unspecified atrial fibrillation: Secondary | ICD-10-CM | POA: Diagnosis not present

## 2017-10-23 DIAGNOSIS — G8331 Monoplegia, unspecified affecting right dominant side: Secondary | ICD-10-CM | POA: Diagnosis not present

## 2017-10-23 DIAGNOSIS — I639 Cerebral infarction, unspecified: Secondary | ICD-10-CM | POA: Diagnosis not present

## 2017-10-23 DIAGNOSIS — E119 Type 2 diabetes mellitus without complications: Secondary | ICD-10-CM | POA: Diagnosis not present

## 2017-10-23 DIAGNOSIS — I69398 Other sequelae of cerebral infarction: Secondary | ICD-10-CM | POA: Diagnosis not present

## 2017-10-23 DIAGNOSIS — I69319 Unspecified symptoms and signs involving cognitive functions following cerebral infarction: Secondary | ICD-10-CM | POA: Diagnosis not present

## 2017-10-23 DIAGNOSIS — M6281 Muscle weakness (generalized): Secondary | ICD-10-CM | POA: Diagnosis not present

## 2017-10-23 NOTE — Patient Instructions (Signed)
Keep exercising even after you are done with therapy

## 2017-10-23 NOTE — Progress Notes (Signed)
Subjective:    Patient ID: [name], female    DOB: [birth year], 72 y.o.   MRN: [id number] 72 y.o. female with history of Afib, diabetes and RA who presented on 06/14/17 with headache and blurred vision after having stopped xarelto on 06/12/17 for nerve block.  MRI revealed acute infarcts of the posterior right middle cerebral artery territory and the right posterior cerebral artery territory with petechial hemorrhage in the distribution of the right PCA infarct. . Patient with ongoing left field cut with deficits in visual-spatial awareness and coordination, left inattention, significant cognitive deficits (MoCA 10/30) with lack of awareness of deficits and unsteady gait  HPI Patient gave a 1 year history of right lower extremity weakness.  EMG/NCV were limited by lower extremity edema however the needle study did show denervation in the vastus medialis and rectus femoris with no evidence of denervation in the hamstring the tibialis anterior tibialis posterior, right hip adductor longus Original diagnosis included lumbar plexopathy, femoral neuropathy versus L3 radiculopathy  Patient does not complain of back pain no new bowel or bladder dysfunction Pain Inventory Average Pain  no pain Pain Right Now  no pain My pain is  no pain  In the last 24 hours, has pain interfered with the following? General activity  no pain Relation with others  no pain Enjoyment of life  no pain What TIME of day is your pain at its worst?  no pain Sleep (in general) Good  Pain is worse with:  no pain Pain improves with:  no pain Relief from Meds:  no pain  Mobility use a wheelchair transfers alone Do you have any goals in this area?  yes  Function retired  Neuro/Psych trouble walking  Prior Studies Any changes since last visit?  no  Physicians involved in your care Any changes since last visit?  no   Family History  Problem Relation Age of Onset  . Pneumonia Mother 40  . Stroke Father 66    . Heart failure Sister   . Hypertension Sister   . Heart attack Neg Hx    Social History   Socioeconomic History  . Marital status: Divorced    Spouse name: Not on file  . Number of children: Not on file  . Years of education: Not on file  . Highest education level: Not on file  Occupational History  . Occupation: Retired  Scientific laboratory technician  . Financial resource strain: Not on file  . Food insecurity:    Worry: Not on file    Inability: Not on file  . Transportation needs:    Medical: Not on file    Non-medical: Not on file  Tobacco Use  . Smoking status: Never Smoker  . Smokeless tobacco: Never Used  Substance and Sexual Activity  . Alcohol use: No    Alcohol/week: 0.0 oz  . Drug use: No  . Sexual activity: Not Currently  Lifestyle  . Physical activity:    Days per week: Not on file    Minutes per session: Not on file  . Stress: Not on file  Relationships  . Social connections:    Talks on phone: Not on file    Gets together: Not on file    Attends religious service: Not on file    Active member of club or organization: Not on file    Attends meetings of clubs or organizations: Not on file    Relationship status: Not on file  Other Topics Concern  .  Not on file  Social History Narrative  . Not on file   Past Surgical History:  Procedure Laterality Date  . CATARACT EXTRACTION Bilateral   . COLONOSCOPY WITH PROPOFOL N/A 02/20/2015   Procedure: COLONOSCOPY WITH PROPOFOL;  Surgeon: Carol Ada, MD;  Location: [facility];  Service: Endoscopy;  Laterality: N/A;  . ENTEROSCOPY N/A 03/23/2016   Procedure: ENTEROSCOPY;  Surgeon: Carol Ada, MD;  Location: [facility];  Service: Endoscopy;  Laterality: N/A;  . ENTEROSCOPY N/A 06/17/2016   Procedure: ENTEROSCOPY;  Surgeon: Carol Ada, MD;  Location: [facility];  Service: Endoscopy;  Laterality: N/A;  . ESOPHAGOGASTRODUODENOSCOPY (EGD) WITH PROPOFOL N/A 02/20/2015   Procedure: ESOPHAGOGASTRODUODENOSCOPY (EGD) WITH  PROPOFOL;  Surgeon: Carol Ada, MD;  Location: [facility];  Service: Endoscopy;  Laterality: N/A;  . GIVENS CAPSULE STUDY N/A 03/21/2016   Procedure: GIVENS CAPSULE STUDY;  Surgeon: Carol Ada, MD;  Location: [facility];  Service: Endoscopy;  Laterality: N/A;  . HAND SURGERY  1995 and 1996   artificial joints both hands  . JOINT REPLACEMENT    . LUMBAR LAMINECTOMY/DECOMPRESSION MICRODISCECTOMY N/A 02/10/2016   Procedure: MICROLUMBAR DECOMPRESSION L4-L5 AND L3- L4, AND EXCISION OF SYNOVIAL CYST L4-L5;  Surgeon: Susa Day, MD;  Location: [facility];  Service: Orthopedics;  Laterality: N/A;  . TOTAL HIP ARTHROPLASTY Left 2009  . TOTAL HIP ARTHROPLASTY Right 04/03/2013   Procedure: RIGHT TOTAL HIP ARTHROPLASTY ANTERIOR APPROACH;  Surgeon: Gearlean Alf, MD;  Location: [facility];  Service: Orthopedics;  Laterality: Right;   Past Medical History:  Diagnosis Date  . Abnormal LFTs 07/24/2017  . Actinic keratosis   . Anemia    hx of  . Anemia of chronic renal failure, stage 3 (moderate) ([place]) 07/17/2015  . Anticoagulant causing adverse effect in therapeutic use 07/17/2015  . Anxiety   . Arthritis    rheumatoid  . Atrial fibrillation ([place])    a. persistent, she remains on Xarelto  . Carpal tunnel syndrome   . Chronic diastolic (congestive) heart failure ([place])    a. 04/2016: echo showing a preserved EF of 55-60% with mild MR. LA and RA midly dilated.   . Edema    left leg at ankle resolved now  . Gastroesophageal reflux disease   . Hepatitis 1970   not sure what kind  . Hiatal hernia   . Hyperlipidemia   . Hypertension   . Iron deficiency anemia due to chronic blood loss 07/17/2015  . Jaundice    age 52  . Peripheral vascular disease ([place]) yrs ago   DVT left lower leg questionale told by 2 drs i had no clot, 1 md said i did  . Rheumatoid arthritis(714.0)   . Right knee pain   . Stroke ([place])   . Type II diabetes mellitus ([facility])    type2   BP 118/66   Pulse (!) 110   Ht 5\' 7"   (1.702 m)   Wt 170 lb (77.1 kg) Comment: pt reported, in wheelchair  SpO2 91%   BMI 26.63 kg/m   Opioid Risk Score:   Fall Risk Score:  `1  Depression screen PHQ 2/9  Depression screen PHQ 2/9 10/23/2017  Decreased Interest 0  Down, Depressed, Hopeless 0  PHQ - 2 Score 0  Some recent data might be hidden    Review of Systems  Constitutional: Negative.   HENT: Negative.   Eyes: Negative.   Respiratory: Positive for cough, shortness of breath and wheezing.   Cardiovascular: Negative.   Gastrointestinal: Negative.  Endocrine: Negative.   Genitourinary: Negative.   Musculoskeletal: Positive for gait problem.  Skin: Negative.   Allergic/Immunologic: Negative.   Hematological: Bruises/bleeds easily.  Psychiatric/Behavioral: Negative.   All other systems reviewed and are negative.      Objective:   Physical Exam  Constitutional: She is oriented to person, place, and time. She appears well-developed and well-nourished. No distress.  HENT:  Head: Normocephalic and atraumatic.  Eyes: Pupils are equal, round, and reactive to light. EOM are normal.  Neurological: She is alert and oriented to person, place, and time. Gait abnormal.  Motor strength is 5/5 in the right deltoid bicep tricep grip 5- in the left deltoid bicep tricep grip, 5- in the left hip flexor knee extensor ankle dorsiflexion 3+ in the right hip flexor knee extensor, 4+ right ankle dorsiflexor. Sensation reduced in the anterior and medial thigh on the right side only.  Skin: She is not diaphoretic.  Nursing note and vitals reviewed. Negative straight leg raising        Assessment & Plan:  1.  Right lower extremity weakness findings are confined to the femoral nerve distribution however lumbar plexopathy and lumbar radiculopathy also in differential diagnosis Fortunately the patient is recovering.  She is undergoing some physical therapy.  She is still at the skilled nursing facility.  I will see her back in  3 months.  She needs to continue her home excise program after she is discharged from skilled nursing facility.  No further work-up needed at this time unless symptoms worsen

## 2017-10-31 DIAGNOSIS — M069 Rheumatoid arthritis, unspecified: Secondary | ICD-10-CM | POA: Diagnosis not present

## 2017-10-31 DIAGNOSIS — M6281 Muscle weakness (generalized): Secondary | ICD-10-CM | POA: Diagnosis not present

## 2017-10-31 DIAGNOSIS — R278 Other lack of coordination: Secondary | ICD-10-CM | POA: Diagnosis not present

## 2017-11-01 DIAGNOSIS — M069 Rheumatoid arthritis, unspecified: Secondary | ICD-10-CM | POA: Diagnosis not present

## 2017-11-01 DIAGNOSIS — M6281 Muscle weakness (generalized): Secondary | ICD-10-CM | POA: Diagnosis not present

## 2017-11-01 DIAGNOSIS — I482 Chronic atrial fibrillation: Secondary | ICD-10-CM | POA: Diagnosis not present

## 2017-11-01 DIAGNOSIS — R278 Other lack of coordination: Secondary | ICD-10-CM | POA: Diagnosis not present

## 2017-11-01 DIAGNOSIS — E119 Type 2 diabetes mellitus without complications: Secondary | ICD-10-CM | POA: Diagnosis not present

## 2017-11-01 DIAGNOSIS — R0602 Shortness of breath: Secondary | ICD-10-CM | POA: Diagnosis not present

## 2017-11-01 DIAGNOSIS — R05 Cough: Secondary | ICD-10-CM | POA: Diagnosis not present

## 2017-11-02 DIAGNOSIS — M069 Rheumatoid arthritis, unspecified: Secondary | ICD-10-CM | POA: Diagnosis not present

## 2017-11-02 DIAGNOSIS — R278 Other lack of coordination: Secondary | ICD-10-CM | POA: Diagnosis not present

## 2017-11-02 DIAGNOSIS — M6281 Muscle weakness (generalized): Secondary | ICD-10-CM | POA: Diagnosis not present

## 2017-11-02 NOTE — Progress Notes (Signed)
[facility] [address] [place]. [place] [address] [phone]       OFFICE FOLLOW UP NOTE  Christina. [name] Date of Birth:  11/05/1945 Medical Record Number:  [id number]    Reason for Referral:  hospital stroke follow up   CHIEF COMPLAINT:  Chief Complaint  Patient presents with  . History of CVA    Denies new stroke sx. since last ov and reports compliance with Xarelto and Crestor.  Sees cardiology for AFib. Has not been contacted to sched. PSG. Was hospitalized on 10/04/17 for pneumonia; sts. she is back to baseline and even some better as she is now walking some with a walker.  No recent falls/fim  . Atrial Fibrillation    HPI: [name] is being seen today for initial visit in the office for right MCA and right PCA on 06/14/17. History obtained from patient, daughter, son and chart review. Reviewed all radiology images and labs personally.  [name] is a 72 year old female with history of CKD, A. fib on Xarelto, CHF, HLD, HTN, PVD, diabetes, rheumatoid arthritis and history of stroke admitted for headache and decreased vision.  Exam showed left hemianopia.  She was on Xarelto until last Monday due to upcoming procedures.  CT head reviewed and showed right PCA subacute stroke.  MRI brain reviewed and showed right PCA subacute stroke with petechiae hemorrhage as well as right MCA inferior territory acute infarct.  CTA head and neck unremarkable.  2D echo EF 60-65%.  LDL 66 and A1c 6.9. Patient stroke most likely due to A. fib not on anti-coagulation due to upcoming procedure.  Currently not able to restart [facility] due to large size of the stroke.  Continue aspirin 325.  Resume Xarelto in 7 days for stroke prevention.  Aspirin can be discontinued at that time.  Continue Crestor.  PT/OT evaluation Recommend home PT/OT.  Since discharge, patient continues to participate in home PT. Her vision has been improving and feels as though most of it has resolved but does  wax/wane at times especially with increased fatigue. Continues to take Xarelto without side effects of bleeding or bruising. Continues to take crestor but since increase, has increased complaints of muscle aches and pain. BP stable at 140/84 and this is checked during PT sessions. Family concerned about worsening memory since stroke. AFT 8, recall 2/3 and clock drawing 3/4. Currently sitting in a w/c which patient did not have to use prior to stroke. Patient has old right leg injury with decreased muscle tone. She states this worsened with recent stroke but is working with PT and doing HEP daily. Patient currently living with family but able to do most ADLs with assistance in IADLs. Recently accepted to [facility] and will be transporting her there today or tomorrow. Does have complaints of frequent snoring, restless leg and daytime fatigue.  Has not been assessed for sleep apnea in the past.  Denies new or worsening stroke/TIA symptoms.   Interval history: Since previous follow-up, patient was seen in the ED on 10/04/2017 for shortness of breath and was admitted for pneumonia, UTI and atrial fibrillation and RVR along with being found in severe sepsis.  Patient was treated with antibiotics along with IV diltiazem.  Patient was discharged back to SNF in stable condition.  Was also recommended for patient to obtain a sleep study to rule out OSA but [facility] sleep lab was unable to contact [place] from the facility after calling multiple times.  Patient is being seen today for routine stroke follow-up and is accompanied by a CNA from [facility] where she currently resides.  Overall, she is recovered well from hospitalization with pneumonia, UTI and A. fib with RVR.  She states that prior to going into the hospital, she was able to ambulate without assistive device but has had to return back to using rolling walker.  She is currently sitting in wheelchair due to needing to be in a wheelchair for transportation.   She continues with PT with a goal of being able to ambulate again without assistive device.  She continues to take Xarelto without side effects of bleeding or bruising.  Continues to take Crestor without further complaints of myalgias.  At previous visit, it was discussed that patient had complaints of snoring, restless legs and daytime fatigue.  Patient no longer endorses the symptoms therefore we will hold off on sleep apnea testing at this time.  Patient has followed with her cardiologist in regards to atrial fibrillation and Eliquis management.  Blood pressure today satisfactory 122/66.  Patient denies new or worsening stroke/TIA symptoms.    ROS:   14 system review of systems performed and negative with exception of walking difficulty  PMH:  Past Medical History:  Diagnosis Date  . Abnormal LFTs 07/24/2017  . Actinic keratosis   . Anemia    hx of  . Anemia of chronic renal failure, stage 3 (moderate) ([place]) 07/17/2015  . Anticoagulant causing adverse effect in therapeutic use 07/17/2015  . Anxiety   . Arthritis    rheumatoid  . Atrial fibrillation ([place])    a. persistent, she remains on Xarelto  . Carpal tunnel syndrome   . Chronic diastolic (congestive) heart failure ([place])    a. 04/2016: echo showing a preserved EF of 55-60% with mild MR. LA and RA midly dilated.   . Edema    left leg at ankle resolved now  . Gastroesophageal reflux disease   . Hepatitis 1970   not sure what kind  . Hiatal hernia   . Hyperlipidemia   . Hypertension   . Iron deficiency anemia due to chronic blood loss 07/17/2015  . Jaundice    age 81  . Peripheral vascular disease ([place]) yrs ago   DVT left lower leg questionale told by 2 drs i had no clot, 1 md said i did  . Rheumatoid arthritis(714.0)   . Right knee pain   . Stroke ([place])   . Type II diabetes mellitus ([place])    type2    PSH:  Past Surgical History:  Procedure Laterality Date  . CATARACT EXTRACTION Bilateral   . COLONOSCOPY WITH PROPOFOL  N/A 02/20/2015   Procedure: COLONOSCOPY WITH PROPOFOL;  Surgeon: Carol Ada, MD;  Location: [facility];  Service: Endoscopy;  Laterality: N/A;  . ENTEROSCOPY N/A 03/23/2016   Procedure: ENTEROSCOPY;  Surgeon: Carol Ada, MD;  Location: [facility];  Service: Endoscopy;  Laterality: N/A;  . ENTEROSCOPY N/A 06/17/2016   Procedure: ENTEROSCOPY;  Surgeon: Carol Ada, MD;  Location: [facility];  Service: Endoscopy;  Laterality: N/A;  . ESOPHAGOGASTRODUODENOSCOPY (EGD) WITH PROPOFOL N/A 02/20/2015   Procedure: ESOPHAGOGASTRODUODENOSCOPY (EGD) WITH PROPOFOL;  Surgeon: Carol Ada, MD;  Location: [facility];  Service: Endoscopy;  Laterality: N/A;  . GIVENS CAPSULE STUDY N/A 03/21/2016   Procedure: GIVENS CAPSULE STUDY;  Surgeon: Carol Ada, MD;  Location: [facility];  Service: Endoscopy;  Laterality: N/A;  . HAND SURGERY  1995 and 1996   artificial joints both  hands  . JOINT REPLACEMENT    . LUMBAR LAMINECTOMY/DECOMPRESSION MICRODISCECTOMY N/A 02/10/2016   Procedure: MICROLUMBAR DECOMPRESSION L4-L5 AND L3- L4, AND EXCISION OF SYNOVIAL CYST L4-L5;  Surgeon: Susa Day, MD;  Location: [facility];  Service: Orthopedics;  Laterality: N/A;  . TOTAL HIP ARTHROPLASTY Left 2009  . TOTAL HIP ARTHROPLASTY Right 04/03/2013   Procedure: RIGHT TOTAL HIP ARTHROPLASTY ANTERIOR APPROACH;  Surgeon: Gearlean Alf, MD;  Location: [facility];  Service: Orthopedics;  Laterality: Right;    Social History:  Social History   Socioeconomic History  . Marital status: Divorced    Spouse name: Not on file  . Number of children: Not on file  . Years of education: Not on file  . Highest education level: Not on file  Occupational History  . Occupation: Retired  Scientific laboratory technician  . Financial resource strain: Not on file  . Food insecurity:    Worry: Not on file    Inability: Not on file  . Transportation needs:    Medical: Not on file    Non-medical: Not on file  Tobacco Use  . Smoking status: Never Smoker    . Smokeless tobacco: Never Used  Substance and Sexual Activity  . Alcohol use: No    Alcohol/week: 0.0 oz  . Drug use: No  . Sexual activity: Not Currently  Lifestyle  . Physical activity:    Days per week: Not on file    Minutes per session: Not on file  . Stress: Not on file  Relationships  . Social connections:    Talks on phone: Not on file    Gets together: Not on file    Attends religious service: Not on file    Active member of club or organization: Not on file    Attends meetings of clubs or organizations: Not on file    Relationship status: Not on file  . Intimate partner violence:    Fear of current or ex partner: Not on file    Emotionally abused: Not on file    Physically abused: Not on file    Forced sexual activity: Not on file  Other Topics Concern  . Not on file  Social History Narrative  . Not on file    Family History:  Family History  Problem Relation Age of Onset  . Pneumonia Mother 68  . Stroke Father 58  . Heart failure Sister   . Hypertension Sister   . Heart attack Neg Hx     Medications:   Current Outpatient Medications on File Prior to Visit  Medication Sig Dispense Refill  . Abatacept (ORENCIA CLICKJECT) 720 MG/ML SOAJ Inject 125 mg into the skin every Thursday.     [name] buPROPion (WELLBUTRIN SR) 150 MG 12 hr tablet Take 150 mg by mouth 2 (two) times daily.      . Cholecalciferol (VITAMIN D3) 1000 units CAPS Take 1,000 Units by mouth every evening.     [name] esomeprazole (NEXIUM) 40 MG capsule Take 40 mg by mouth every morning.     . ferrous gluconate (FERGON) 324 MG tablet Take 1 tablet (324 mg total) by mouth 2 (two) times daily with a meal. 60 tablet 3  . furosemide (LASIX) 40 MG tablet Take 40 mg by mouth daily.     [name] gabapentin (NEURONTIN) 100 MG capsule Take 3 capsules (300 mg total) by mouth 3 (three) times daily.    [name] glimepiride (AMARYL) 2 MG tablet TAKE 1 TABLET DAILY BEFORE BREAKFAST    .  leflunomide (ARAVA) 10 MG tablet Take 10 mg  by mouth daily.    . metFORMIN (GLUCOPHAGE-XR) 500 MG 24 hr tablet Take 500 mg by mouth 2 (two) times daily.     . metoprolol tartrate (LOPRESSOR) 50 MG tablet TAKE 2 TABLETS (100 MG TOTAL) BY MOUTH 2 (TWO) TIMES DAILY. 120 tablet 4  . Multiple Vitamins-Minerals (PRESERVISION AREDS 2) CAPS Take 1 capsule by mouth daily.     . nitroGLYCERIN (NITROSTAT) 0.4 MG SL tablet Place 1 tablet (0.4 mg total) under the tongue every 5 (five) minutes as needed for chest pain (x 3 doses). 25 tablet 2  . potassium chloride SA (K-DUR,KLOR-CON) 20 MEQ tablet Take 1 tablet (20 mEq total) by mouth 2 (two) times daily.    . predniSONE (DELTASONE) 5 MG tablet Take 5 mg by mouth daily.    . Rivaroxaban (XARELTO) 15 MG TABS tablet Take 1 tablet (15 mg total) by mouth daily. 30 tablet 0  . vitamin B-12 1000 MCG tablet Take 1 tablet (1,000 mcg total) by mouth every evening. (Patient taking differently: Take 2,000 mcg by mouth every evening. )    . VOLTAREN 1 % GEL Apply 2 g topically at bedtime as needed for pain. Knees, calf  2  . QUEtiapine (SEROQUEL) 25 MG tablet Take 1 tablet (25 mg total) by mouth 3 (three) times daily as needed (delerium or aggitation). (Patient not taking: Reported on 11/07/2017) 30 tablet 0   No current facility-administered medications on file prior to visit.     Allergies:  No Known Allergies   Physical Exam  Vitals:   11/07/17 1039  BP: 122/66  Pulse: 92  Resp: 20  Weight: 174 lb 12.8 oz (79.3 kg)  Height: 5\' 7"  (1.702 m)   Body mass index is 27.38 kg/m. No exam data present  General: well developed, pleasant elderly caucasian female, well nourished, seated, in no evident distress Head: head normocephalic and atraumatic.   Neck: supple with no carotid or supraclavicular bruits Cardiovascular: irregular rate and rhythm, no murmurs Musculoskeletal: no deformity Skin:  no rash/petichiae Vascular:  Normal pulses all extremities  Neurologic Exam Mental Status: Awake and fully  alert. Oriented to place and time.  Recent and remote memory intact.  Fund of knowledge appropriate. Cranial Nerves: Fundoscopic exam reveals sharp disc margins. Pupils equal, briskly reactive to light. Extraocular movements full without nystagmus. Mild left homonymous superior quadrantanopia. Hearing intact. Facial sensation intact. Face, tongue, palate moves normally and symmetrically.  Motor: Normal bulk and tone.  Equal strength in all tested extremities. Sensory.: intact to touch , pinprick , position and vibratory sensation.  Coordination: Rapid alternating movements normal in all extremities. Finger-to-nose and heel-to-shin performed accurately bilaterally. Gait and Station: Patient is currently using wheelchair as she uses rolling walker at facility but as this is not present during appointment, unable to test ambulation Reflexes: 1+ and symmetric. Toes downgoing.     Diagnostic Data (Labs, Imaging, Testing)  CT HEAD WO CONTRAST 06/14/17 IMPRESSION: 1. Acute right posterior cerebral artery infarct.  No hemorrhage. 2. Atrophy and chronic microvascular white matter ischemic changes. 3. Remote right cerebellar infarct.  CTA HEAD W OR WO CONTRAST CTA NECK W OR WO CONTRAST 06/14/17 IMPRESSION: 1. No emergent large vessel occlusion or flow-limiting stenosis. 2. Redemonstration of right PCA territory infarct with expected evolution, but no hemorrhage or mass effect. The right posterior cerebral artery is angiographically patent. 3. Mild atherosclerotic calcification at the aortic arch, carotid bifurcations and skull base segments of  the internal carotid arteries without hemodynamically significant stenosis by NASCET criteria. ( Aortic Atherosclerosis (ICD10-I70.0). ) 4. **An incidental finding of potential clinical significance has been found. Multiple thyroid nodules, measuring up to 3 cm. Dedicated thyroid ultrasound is recommended for further characterization on a nonemergent  outpatient basis. **  MR BRAIN WO CONTRAST 06/14/17 IMPRESSION: 1. Acute infarcts of the posterior right middle cerebral artery territory and the right posterior cerebral artery territory. 2. Petechial hemorrhage in the distribution of the right PCA infarct. No intraparenchymal hematoma. No mass effect or hydrocephalus. 3. Chronic small vessel disease.  ECHOCARDIOGRAM COMPLETE 06/16/17 Study Conclusions - Left ventricle: The cavity size was normal. There was moderate   concentric hypertrophy. Systolic function was normal. The   estimated ejection fraction was in the range of 60% to 65%. Wall   motion was normal; there were no regional wall motion   abnormalities. - Mitral valve: Calcified annulus. Moderately thickened, moderately   calcified leaflets . The findings are consistent with moderate   stenosis. There was mild to moderate regurgitation. Valve area by   pressure half-time: 2.22 cm^2. Valve area by continuity equation   (using LVOT flow): 1.53 cm^2. - Right ventricle: The cavity size was mildly dilated. Wall   thickness was normal. Systolic function was mildly reduced. - Pulmonary arteries: Systolic pressure was moderately increased.   PA peak pressure: 49 mm Hg (S). - Pericardium, extracardiac: There was no pericardial effusion.   ASSESSMENT: LOYOLA SANTINO is a 72 y.o. year old female here with right MCA and right PCA on 06/1317 secondary to atrial fibrillation. Vascular risk factors include atrial fibrillation and HLD.  Recent hospitalization on 10/04/2017 due to sepsis with pneumonia and UTI and A. fib and RVR.  Patient has recovered well and currently residing at [facility].  Patient doing well from a stroke standpoint.    PLAN: -Continue Xarelto (rivaroxaban) daily  and crestor for secondary stroke prevention -F/u with PCP regarding your HLD management - PCP to prescribe -F/u with cardiologist regarding atrial fibrillation and Xarelto managment -continue to  monitor BP at home -Recommend to continue PT for continued difficulties with walking after recent hospitalization from possible deconditioning -Maintain strict control of hypertension with blood pressure goal below 130/90, diabetes with hemoglobin A1c goal below 6.5% and cholesterol with LDL cholesterol (bad cholesterol) goal below 70 mg/dL. I also advised the patient to eat a healthy diet with plenty of whole grains, cereals, fruits and vegetables, exercise regularly and maintain ideal body weight.  Follow up in 6 months or call earlier if needed  Greater than 50% time during this 25 minute consultation visit was spent on counseling and coordination of care about HLD, and a fib, discussion about risk benefit of anticoagulation and answering questions.   Venancio Poisson, AGNP-BC  [facility] [address] [place], [place] [address]  Phone [phone] Fax [phone]

## 2017-11-03 DIAGNOSIS — R278 Other lack of coordination: Secondary | ICD-10-CM | POA: Diagnosis not present

## 2017-11-03 DIAGNOSIS — M069 Rheumatoid arthritis, unspecified: Secondary | ICD-10-CM | POA: Diagnosis not present

## 2017-11-03 DIAGNOSIS — M6281 Muscle weakness (generalized): Secondary | ICD-10-CM | POA: Diagnosis not present

## 2017-11-06 DIAGNOSIS — M069 Rheumatoid arthritis, unspecified: Secondary | ICD-10-CM | POA: Diagnosis not present

## 2017-11-06 DIAGNOSIS — R278 Other lack of coordination: Secondary | ICD-10-CM | POA: Diagnosis not present

## 2017-11-06 DIAGNOSIS — M6281 Muscle weakness (generalized): Secondary | ICD-10-CM | POA: Diagnosis not present

## 2017-11-07 ENCOUNTER — Ambulatory Visit (INDEPENDENT_AMBULATORY_CARE_PROVIDER_SITE_OTHER): Payer: Medicare Other | Admitting: Adult Health

## 2017-11-07 ENCOUNTER — Encounter: Payer: Self-pay | Admitting: Adult Health

## 2017-11-07 ENCOUNTER — Other Ambulatory Visit: Payer: Self-pay

## 2017-11-07 VITALS — BP 122/66 | HR 92 | Resp 20 | Ht 67.0 in | Wt 174.8 lb

## 2017-11-07 DIAGNOSIS — I639 Cerebral infarction, unspecified: Secondary | ICD-10-CM

## 2017-11-07 DIAGNOSIS — I10 Essential (primary) hypertension: Secondary | ICD-10-CM

## 2017-11-07 DIAGNOSIS — R278 Other lack of coordination: Secondary | ICD-10-CM | POA: Diagnosis not present

## 2017-11-07 DIAGNOSIS — E785 Hyperlipidemia, unspecified: Secondary | ICD-10-CM

## 2017-11-07 DIAGNOSIS — I480 Paroxysmal atrial fibrillation: Secondary | ICD-10-CM

## 2017-11-07 DIAGNOSIS — M069 Rheumatoid arthritis, unspecified: Secondary | ICD-10-CM | POA: Diagnosis not present

## 2017-11-07 DIAGNOSIS — M6281 Muscle weakness (generalized): Secondary | ICD-10-CM | POA: Diagnosis not present

## 2017-11-07 NOTE — Patient Instructions (Signed)
Continue Xarelto (rivaroxaban) daily  and Crestor  for secondary stroke prevention  Continue to follow up with PCP regarding cholesterol and blood pressure management   continue to follow up with cardiologist for atrial fibrillation and Xarelto management   continue with therapy with goals of not having to use assistive device for ambulation  Continue to monitor blood pressure at home  Maintain strict control of hypertension with blood pressure goal below 130/90, diabetes with hemoglobin A1c goal below 6.5% and cholesterol with LDL cholesterol (bad cholesterol) goal below 70 mg/dL. I also advised the patient to eat a healthy diet with plenty of whole grains, cereals, fruits and vegetables, exercise regularly and maintain ideal body weight.  Followup in the future with me in 6 months or call earlier if needed       Thank you for coming to see [place] at [facility]. I hope we have been able to provide you high quality care today.  You may receive a patient satisfaction survey over the next few weeks. We would appreciate your feedback and comments so that we may continue to improve ourselves and the health of our patients.

## 2017-11-08 DIAGNOSIS — M069 Rheumatoid arthritis, unspecified: Secondary | ICD-10-CM | POA: Diagnosis not present

## 2017-11-08 DIAGNOSIS — R278 Other lack of coordination: Secondary | ICD-10-CM | POA: Diagnosis not present

## 2017-11-08 DIAGNOSIS — M6281 Muscle weakness (generalized): Secondary | ICD-10-CM | POA: Diagnosis not present

## 2017-11-09 DIAGNOSIS — R278 Other lack of coordination: Secondary | ICD-10-CM | POA: Diagnosis not present

## 2017-11-09 DIAGNOSIS — M069 Rheumatoid arthritis, unspecified: Secondary | ICD-10-CM | POA: Diagnosis not present

## 2017-11-09 DIAGNOSIS — M6281 Muscle weakness (generalized): Secondary | ICD-10-CM | POA: Diagnosis not present

## 2017-11-10 DIAGNOSIS — M6281 Muscle weakness (generalized): Secondary | ICD-10-CM | POA: Diagnosis not present

## 2017-11-10 DIAGNOSIS — M069 Rheumatoid arthritis, unspecified: Secondary | ICD-10-CM | POA: Diagnosis not present

## 2017-11-10 DIAGNOSIS — R278 Other lack of coordination: Secondary | ICD-10-CM | POA: Diagnosis not present

## 2017-11-13 DIAGNOSIS — M069 Rheumatoid arthritis, unspecified: Secondary | ICD-10-CM | POA: Diagnosis not present

## 2017-11-13 DIAGNOSIS — M6281 Muscle weakness (generalized): Secondary | ICD-10-CM | POA: Diagnosis not present

## 2017-11-13 DIAGNOSIS — R278 Other lack of coordination: Secondary | ICD-10-CM | POA: Diagnosis not present

## 2017-11-13 DIAGNOSIS — N390 Urinary tract infection, site not specified: Secondary | ICD-10-CM | POA: Diagnosis not present

## 2017-11-14 DIAGNOSIS — R278 Other lack of coordination: Secondary | ICD-10-CM | POA: Diagnosis not present

## 2017-11-14 DIAGNOSIS — M6281 Muscle weakness (generalized): Secondary | ICD-10-CM | POA: Diagnosis not present

## 2017-11-14 DIAGNOSIS — M069 Rheumatoid arthritis, unspecified: Secondary | ICD-10-CM | POA: Diagnosis not present

## 2017-11-15 DIAGNOSIS — M6281 Muscle weakness (generalized): Secondary | ICD-10-CM | POA: Diagnosis not present

## 2017-11-15 DIAGNOSIS — R278 Other lack of coordination: Secondary | ICD-10-CM | POA: Diagnosis not present

## 2017-11-15 DIAGNOSIS — M069 Rheumatoid arthritis, unspecified: Secondary | ICD-10-CM | POA: Diagnosis not present

## 2017-11-16 DIAGNOSIS — I482 Chronic atrial fibrillation: Secondary | ICD-10-CM | POA: Diagnosis not present

## 2017-11-16 DIAGNOSIS — R278 Other lack of coordination: Secondary | ICD-10-CM | POA: Diagnosis not present

## 2017-11-16 DIAGNOSIS — M069 Rheumatoid arthritis, unspecified: Secondary | ICD-10-CM | POA: Diagnosis not present

## 2017-11-16 DIAGNOSIS — N390 Urinary tract infection, site not specified: Secondary | ICD-10-CM | POA: Diagnosis not present

## 2017-11-16 DIAGNOSIS — M6281 Muscle weakness (generalized): Secondary | ICD-10-CM | POA: Diagnosis not present

## 2017-11-16 DIAGNOSIS — I10 Essential (primary) hypertension: Secondary | ICD-10-CM | POA: Diagnosis not present

## 2017-11-16 NOTE — Progress Notes (Signed)
I agree with the above plan 

## 2017-11-17 DIAGNOSIS — R278 Other lack of coordination: Secondary | ICD-10-CM | POA: Diagnosis not present

## 2017-11-17 DIAGNOSIS — M6281 Muscle weakness (generalized): Secondary | ICD-10-CM | POA: Diagnosis not present

## 2017-11-17 DIAGNOSIS — M069 Rheumatoid arthritis, unspecified: Secondary | ICD-10-CM | POA: Diagnosis not present

## 2017-11-18 DIAGNOSIS — M069 Rheumatoid arthritis, unspecified: Secondary | ICD-10-CM | POA: Diagnosis not present

## 2017-11-18 DIAGNOSIS — R278 Other lack of coordination: Secondary | ICD-10-CM | POA: Diagnosis not present

## 2017-11-18 DIAGNOSIS — M6281 Muscle weakness (generalized): Secondary | ICD-10-CM | POA: Diagnosis not present

## 2017-11-19 DIAGNOSIS — R278 Other lack of coordination: Secondary | ICD-10-CM | POA: Diagnosis not present

## 2017-11-19 DIAGNOSIS — M069 Rheumatoid arthritis, unspecified: Secondary | ICD-10-CM | POA: Diagnosis not present

## 2017-11-19 DIAGNOSIS — M6281 Muscle weakness (generalized): Secondary | ICD-10-CM | POA: Diagnosis not present

## 2017-11-20 DIAGNOSIS — R278 Other lack of coordination: Secondary | ICD-10-CM | POA: Diagnosis not present

## 2017-11-20 DIAGNOSIS — M069 Rheumatoid arthritis, unspecified: Secondary | ICD-10-CM | POA: Diagnosis not present

## 2017-11-20 DIAGNOSIS — M6281 Muscle weakness (generalized): Secondary | ICD-10-CM | POA: Diagnosis not present

## 2017-11-21 DIAGNOSIS — R278 Other lack of coordination: Secondary | ICD-10-CM | POA: Diagnosis not present

## 2017-11-21 DIAGNOSIS — M6281 Muscle weakness (generalized): Secondary | ICD-10-CM | POA: Diagnosis not present

## 2017-11-21 DIAGNOSIS — M069 Rheumatoid arthritis, unspecified: Secondary | ICD-10-CM | POA: Diagnosis not present

## 2017-11-22 DIAGNOSIS — R278 Other lack of coordination: Secondary | ICD-10-CM | POA: Diagnosis not present

## 2017-11-22 DIAGNOSIS — M069 Rheumatoid arthritis, unspecified: Secondary | ICD-10-CM | POA: Diagnosis not present

## 2017-11-22 DIAGNOSIS — M6281 Muscle weakness (generalized): Secondary | ICD-10-CM | POA: Diagnosis not present

## 2017-11-22 DIAGNOSIS — I10 Essential (primary) hypertension: Secondary | ICD-10-CM | POA: Diagnosis not present

## 2017-11-22 DIAGNOSIS — I482 Chronic atrial fibrillation: Secondary | ICD-10-CM | POA: Diagnosis not present

## 2017-11-23 DIAGNOSIS — M6281 Muscle weakness (generalized): Secondary | ICD-10-CM | POA: Diagnosis not present

## 2017-11-23 DIAGNOSIS — R278 Other lack of coordination: Secondary | ICD-10-CM | POA: Diagnosis not present

## 2017-11-23 DIAGNOSIS — M069 Rheumatoid arthritis, unspecified: Secondary | ICD-10-CM | POA: Diagnosis not present

## 2017-11-24 DIAGNOSIS — R278 Other lack of coordination: Secondary | ICD-10-CM | POA: Diagnosis not present

## 2017-11-24 DIAGNOSIS — M6281 Muscle weakness (generalized): Secondary | ICD-10-CM | POA: Diagnosis not present

## 2017-11-24 DIAGNOSIS — M069 Rheumatoid arthritis, unspecified: Secondary | ICD-10-CM | POA: Diagnosis not present

## 2017-11-25 DIAGNOSIS — M069 Rheumatoid arthritis, unspecified: Secondary | ICD-10-CM | POA: Diagnosis not present

## 2017-11-25 DIAGNOSIS — M6281 Muscle weakness (generalized): Secondary | ICD-10-CM | POA: Diagnosis not present

## 2017-11-25 DIAGNOSIS — R278 Other lack of coordination: Secondary | ICD-10-CM | POA: Diagnosis not present

## 2017-11-27 DIAGNOSIS — M6281 Muscle weakness (generalized): Secondary | ICD-10-CM | POA: Diagnosis not present

## 2017-11-27 DIAGNOSIS — R278 Other lack of coordination: Secondary | ICD-10-CM | POA: Diagnosis not present

## 2017-11-27 DIAGNOSIS — M069 Rheumatoid arthritis, unspecified: Secondary | ICD-10-CM | POA: Diagnosis not present

## 2017-11-28 DIAGNOSIS — M069 Rheumatoid arthritis, unspecified: Secondary | ICD-10-CM | POA: Diagnosis not present

## 2017-11-28 DIAGNOSIS — M6281 Muscle weakness (generalized): Secondary | ICD-10-CM | POA: Diagnosis not present

## 2017-11-28 DIAGNOSIS — R278 Other lack of coordination: Secondary | ICD-10-CM | POA: Diagnosis not present

## 2017-11-29 DIAGNOSIS — R600 Localized edema: Secondary | ICD-10-CM | POA: Diagnosis not present

## 2017-11-29 DIAGNOSIS — I63411 Cerebral infarction due to embolism of right middle cerebral artery: Secondary | ICD-10-CM | POA: Diagnosis not present

## 2017-11-29 DIAGNOSIS — Z79899 Other long term (current) drug therapy: Secondary | ICD-10-CM | POA: Diagnosis not present

## 2017-11-29 DIAGNOSIS — M5030 Other cervical disc degeneration, unspecified cervical region: Secondary | ICD-10-CM | POA: Diagnosis not present

## 2017-11-29 DIAGNOSIS — Z7952 Long term (current) use of systemic steroids: Secondary | ICD-10-CM | POA: Diagnosis not present

## 2017-11-29 DIAGNOSIS — M2550 Pain in unspecified joint: Secondary | ICD-10-CM | POA: Diagnosis not present

## 2017-11-29 DIAGNOSIS — D500 Iron deficiency anemia secondary to blood loss (chronic): Secondary | ICD-10-CM | POA: Diagnosis not present

## 2017-11-29 DIAGNOSIS — Z6828 Body mass index (BMI) 28.0-28.9, adult: Secondary | ICD-10-CM | POA: Diagnosis not present

## 2017-11-29 DIAGNOSIS — E663 Overweight: Secondary | ICD-10-CM | POA: Diagnosis not present

## 2017-11-29 DIAGNOSIS — M25532 Pain in left wrist: Secondary | ICD-10-CM | POA: Diagnosis not present

## 2017-11-29 DIAGNOSIS — M25561 Pain in right knee: Secondary | ICD-10-CM | POA: Diagnosis not present

## 2017-11-29 DIAGNOSIS — M0589 Other rheumatoid arthritis with rheumatoid factor of multiple sites: Secondary | ICD-10-CM | POA: Diagnosis not present

## 2017-11-30 DIAGNOSIS — Z79899 Other long term (current) drug therapy: Secondary | ICD-10-CM | POA: Diagnosis not present

## 2017-11-30 DIAGNOSIS — M6281 Muscle weakness (generalized): Secondary | ICD-10-CM | POA: Diagnosis not present

## 2017-11-30 DIAGNOSIS — M069 Rheumatoid arthritis, unspecified: Secondary | ICD-10-CM | POA: Diagnosis not present

## 2017-11-30 DIAGNOSIS — D649 Anemia, unspecified: Secondary | ICD-10-CM | POA: Diagnosis not present

## 2017-11-30 DIAGNOSIS — R278 Other lack of coordination: Secondary | ICD-10-CM | POA: Diagnosis not present

## 2017-12-01 DIAGNOSIS — M6281 Muscle weakness (generalized): Secondary | ICD-10-CM | POA: Diagnosis not present

## 2017-12-01 DIAGNOSIS — R278 Other lack of coordination: Secondary | ICD-10-CM | POA: Diagnosis not present

## 2017-12-01 DIAGNOSIS — M069 Rheumatoid arthritis, unspecified: Secondary | ICD-10-CM | POA: Diagnosis not present

## 2017-12-04 DIAGNOSIS — M6281 Muscle weakness (generalized): Secondary | ICD-10-CM | POA: Diagnosis not present

## 2017-12-04 DIAGNOSIS — M069 Rheumatoid arthritis, unspecified: Secondary | ICD-10-CM | POA: Diagnosis not present

## 2017-12-05 DIAGNOSIS — M6281 Muscle weakness (generalized): Secondary | ICD-10-CM | POA: Diagnosis not present

## 2017-12-05 DIAGNOSIS — M069 Rheumatoid arthritis, unspecified: Secondary | ICD-10-CM | POA: Diagnosis not present

## 2017-12-06 DIAGNOSIS — M069 Rheumatoid arthritis, unspecified: Secondary | ICD-10-CM | POA: Diagnosis not present

## 2017-12-06 DIAGNOSIS — M6281 Muscle weakness (generalized): Secondary | ICD-10-CM | POA: Diagnosis not present

## 2017-12-07 DIAGNOSIS — M6281 Muscle weakness (generalized): Secondary | ICD-10-CM | POA: Diagnosis not present

## 2017-12-07 DIAGNOSIS — M069 Rheumatoid arthritis, unspecified: Secondary | ICD-10-CM | POA: Diagnosis not present

## 2017-12-08 DIAGNOSIS — M6281 Muscle weakness (generalized): Secondary | ICD-10-CM | POA: Diagnosis not present

## 2017-12-08 DIAGNOSIS — M069 Rheumatoid arthritis, unspecified: Secondary | ICD-10-CM | POA: Diagnosis not present

## 2017-12-11 DIAGNOSIS — M069 Rheumatoid arthritis, unspecified: Secondary | ICD-10-CM | POA: Diagnosis not present

## 2017-12-11 DIAGNOSIS — M6281 Muscle weakness (generalized): Secondary | ICD-10-CM | POA: Diagnosis not present

## 2017-12-12 DIAGNOSIS — M6281 Muscle weakness (generalized): Secondary | ICD-10-CM | POA: Diagnosis not present

## 2017-12-12 DIAGNOSIS — M069 Rheumatoid arthritis, unspecified: Secondary | ICD-10-CM | POA: Diagnosis not present

## 2017-12-13 DIAGNOSIS — M6281 Muscle weakness (generalized): Secondary | ICD-10-CM | POA: Diagnosis not present

## 2017-12-13 DIAGNOSIS — M069 Rheumatoid arthritis, unspecified: Secondary | ICD-10-CM | POA: Diagnosis not present

## 2017-12-13 DIAGNOSIS — D649 Anemia, unspecified: Secondary | ICD-10-CM | POA: Diagnosis not present

## 2017-12-14 DIAGNOSIS — N390 Urinary tract infection, site not specified: Secondary | ICD-10-CM | POA: Diagnosis not present

## 2017-12-14 DIAGNOSIS — M069 Rheumatoid arthritis, unspecified: Secondary | ICD-10-CM | POA: Diagnosis not present

## 2017-12-14 DIAGNOSIS — M6281 Muscle weakness (generalized): Secondary | ICD-10-CM | POA: Diagnosis not present

## 2017-12-14 DIAGNOSIS — R0602 Shortness of breath: Secondary | ICD-10-CM | POA: Diagnosis not present

## 2017-12-14 DIAGNOSIS — Z79899 Other long term (current) drug therapy: Secondary | ICD-10-CM | POA: Diagnosis not present

## 2017-12-15 DIAGNOSIS — M6281 Muscle weakness (generalized): Secondary | ICD-10-CM | POA: Diagnosis not present

## 2017-12-15 DIAGNOSIS — M069 Rheumatoid arthritis, unspecified: Secondary | ICD-10-CM | POA: Diagnosis not present

## 2017-12-18 DIAGNOSIS — M069 Rheumatoid arthritis, unspecified: Secondary | ICD-10-CM | POA: Diagnosis not present

## 2017-12-18 DIAGNOSIS — M6281 Muscle weakness (generalized): Secondary | ICD-10-CM | POA: Diagnosis not present

## 2017-12-19 DIAGNOSIS — M069 Rheumatoid arthritis, unspecified: Secondary | ICD-10-CM | POA: Diagnosis not present

## 2017-12-19 DIAGNOSIS — M6281 Muscle weakness (generalized): Secondary | ICD-10-CM | POA: Diagnosis not present

## 2017-12-20 DIAGNOSIS — G5600 Carpal tunnel syndrome, unspecified upper limb: Secondary | ICD-10-CM | POA: Diagnosis not present

## 2017-12-20 DIAGNOSIS — M069 Rheumatoid arthritis, unspecified: Secondary | ICD-10-CM | POA: Diagnosis not present

## 2017-12-20 DIAGNOSIS — M6281 Muscle weakness (generalized): Secondary | ICD-10-CM | POA: Diagnosis not present

## 2017-12-21 DIAGNOSIS — M069 Rheumatoid arthritis, unspecified: Secondary | ICD-10-CM | POA: Diagnosis not present

## 2017-12-21 DIAGNOSIS — M6281 Muscle weakness (generalized): Secondary | ICD-10-CM | POA: Diagnosis not present

## 2017-12-22 DIAGNOSIS — M6281 Muscle weakness (generalized): Secondary | ICD-10-CM | POA: Diagnosis not present

## 2017-12-22 DIAGNOSIS — M069 Rheumatoid arthritis, unspecified: Secondary | ICD-10-CM | POA: Diagnosis not present

## 2017-12-25 DIAGNOSIS — M069 Rheumatoid arthritis, unspecified: Secondary | ICD-10-CM | POA: Diagnosis not present

## 2017-12-25 DIAGNOSIS — M6281 Muscle weakness (generalized): Secondary | ICD-10-CM | POA: Diagnosis not present

## 2017-12-26 DIAGNOSIS — M069 Rheumatoid arthritis, unspecified: Secondary | ICD-10-CM | POA: Diagnosis not present

## 2017-12-26 DIAGNOSIS — M6281 Muscle weakness (generalized): Secondary | ICD-10-CM | POA: Diagnosis not present

## 2017-12-27 DIAGNOSIS — M069 Rheumatoid arthritis, unspecified: Secondary | ICD-10-CM | POA: Diagnosis not present

## 2017-12-27 DIAGNOSIS — M6281 Muscle weakness (generalized): Secondary | ICD-10-CM | POA: Diagnosis not present

## 2017-12-28 DIAGNOSIS — M069 Rheumatoid arthritis, unspecified: Secondary | ICD-10-CM | POA: Diagnosis not present

## 2017-12-28 DIAGNOSIS — M6281 Muscle weakness (generalized): Secondary | ICD-10-CM | POA: Diagnosis not present

## 2017-12-29 DIAGNOSIS — M6281 Muscle weakness (generalized): Secondary | ICD-10-CM | POA: Diagnosis not present

## 2017-12-29 DIAGNOSIS — I63431 Cerebral infarction due to embolism of right posterior cerebral artery: Secondary | ICD-10-CM | POA: Diagnosis not present

## 2017-12-29 DIAGNOSIS — M069 Rheumatoid arthritis, unspecified: Secondary | ICD-10-CM | POA: Diagnosis not present

## 2017-12-29 DIAGNOSIS — Z48814 Encounter for surgical aftercare following surgery on the teeth or oral cavity: Secondary | ICD-10-CM | POA: Diagnosis not present

## 2017-12-29 DIAGNOSIS — N390 Urinary tract infection, site not specified: Secondary | ICD-10-CM | POA: Diagnosis not present

## 2017-12-29 DIAGNOSIS — L853 Xerosis cutis: Secondary | ICD-10-CM | POA: Diagnosis not present

## 2018-01-01 DIAGNOSIS — M069 Rheumatoid arthritis, unspecified: Secondary | ICD-10-CM | POA: Diagnosis not present

## 2018-01-01 DIAGNOSIS — M6281 Muscle weakness (generalized): Secondary | ICD-10-CM | POA: Diagnosis not present

## 2018-01-02 DIAGNOSIS — M6281 Muscle weakness (generalized): Secondary | ICD-10-CM | POA: Diagnosis not present

## 2018-01-02 DIAGNOSIS — M069 Rheumatoid arthritis, unspecified: Secondary | ICD-10-CM | POA: Diagnosis not present

## 2018-01-03 DIAGNOSIS — M6281 Muscle weakness (generalized): Secondary | ICD-10-CM | POA: Diagnosis not present

## 2018-01-03 DIAGNOSIS — M069 Rheumatoid arthritis, unspecified: Secondary | ICD-10-CM | POA: Diagnosis not present

## 2018-01-04 DIAGNOSIS — M069 Rheumatoid arthritis, unspecified: Secondary | ICD-10-CM | POA: Diagnosis not present

## 2018-01-04 DIAGNOSIS — M6281 Muscle weakness (generalized): Secondary | ICD-10-CM | POA: Diagnosis not present

## 2018-01-05 DIAGNOSIS — M069 Rheumatoid arthritis, unspecified: Secondary | ICD-10-CM | POA: Diagnosis not present

## 2018-01-05 DIAGNOSIS — M6281 Muscle weakness (generalized): Secondary | ICD-10-CM | POA: Diagnosis not present

## 2018-01-08 DIAGNOSIS — M6281 Muscle weakness (generalized): Secondary | ICD-10-CM | POA: Diagnosis not present

## 2018-01-08 DIAGNOSIS — M069 Rheumatoid arthritis, unspecified: Secondary | ICD-10-CM | POA: Diagnosis not present

## 2018-01-09 DIAGNOSIS — M6281 Muscle weakness (generalized): Secondary | ICD-10-CM | POA: Diagnosis not present

## 2018-01-09 DIAGNOSIS — M069 Rheumatoid arthritis, unspecified: Secondary | ICD-10-CM | POA: Diagnosis not present

## 2018-01-10 DIAGNOSIS — M6281 Muscle weakness (generalized): Secondary | ICD-10-CM | POA: Diagnosis not present

## 2018-01-10 DIAGNOSIS — M069 Rheumatoid arthritis, unspecified: Secondary | ICD-10-CM | POA: Diagnosis not present

## 2018-01-11 DIAGNOSIS — M6281 Muscle weakness (generalized): Secondary | ICD-10-CM | POA: Diagnosis not present

## 2018-01-11 DIAGNOSIS — M069 Rheumatoid arthritis, unspecified: Secondary | ICD-10-CM | POA: Diagnosis not present

## 2018-01-12 DIAGNOSIS — M6281 Muscle weakness (generalized): Secondary | ICD-10-CM | POA: Diagnosis not present

## 2018-01-12 DIAGNOSIS — M069 Rheumatoid arthritis, unspecified: Secondary | ICD-10-CM | POA: Diagnosis not present

## 2018-01-17 DIAGNOSIS — M25511 Pain in right shoulder: Secondary | ICD-10-CM | POA: Diagnosis not present

## 2018-01-17 DIAGNOSIS — M069 Rheumatoid arthritis, unspecified: Secondary | ICD-10-CM | POA: Diagnosis not present

## 2018-01-17 DIAGNOSIS — M1990 Unspecified osteoarthritis, unspecified site: Secondary | ICD-10-CM | POA: Diagnosis not present

## 2018-01-18 DIAGNOSIS — M25511 Pain in right shoulder: Secondary | ICD-10-CM | POA: Diagnosis not present

## 2018-01-19 DIAGNOSIS — Z23 Encounter for immunization: Secondary | ICD-10-CM | POA: Diagnosis not present

## 2018-01-22 ENCOUNTER — Encounter: Payer: Medicare Other | Attending: Physical Medicine & Rehabilitation

## 2018-01-22 ENCOUNTER — Ambulatory Visit (HOSPITAL_BASED_OUTPATIENT_CLINIC_OR_DEPARTMENT_OTHER): Payer: Medicare Other | Admitting: Physical Medicine & Rehabilitation

## 2018-01-22 ENCOUNTER — Encounter: Payer: Self-pay | Admitting: Physical Medicine & Rehabilitation

## 2018-01-22 VITALS — BP 116/80 | HR 93 | Ht 67.0 in | Wt 174.0 lb

## 2018-01-22 DIAGNOSIS — Z9841 Cataract extraction status, right eye: Secondary | ICD-10-CM | POA: Diagnosis not present

## 2018-01-22 DIAGNOSIS — Z9842 Cataract extraction status, left eye: Secondary | ICD-10-CM | POA: Diagnosis not present

## 2018-01-22 DIAGNOSIS — G8331 Monoplegia, unspecified affecting right dominant side: Secondary | ICD-10-CM | POA: Diagnosis not present

## 2018-01-22 DIAGNOSIS — I4891 Unspecified atrial fibrillation: Secondary | ICD-10-CM | POA: Diagnosis not present

## 2018-01-22 DIAGNOSIS — I69319 Unspecified symptoms and signs involving cognitive functions following cerebral infarction: Secondary | ICD-10-CM | POA: Diagnosis not present

## 2018-01-22 DIAGNOSIS — Z823 Family history of stroke: Secondary | ICD-10-CM | POA: Insufficient documentation

## 2018-01-22 DIAGNOSIS — I639 Cerebral infarction, unspecified: Secondary | ICD-10-CM

## 2018-01-22 DIAGNOSIS — R531 Weakness: Secondary | ICD-10-CM | POA: Diagnosis present

## 2018-01-22 DIAGNOSIS — Z8249 Family history of ischemic heart disease and other diseases of the circulatory system: Secondary | ICD-10-CM | POA: Diagnosis not present

## 2018-01-22 DIAGNOSIS — Z8673 Personal history of transient ischemic attack (TIA), and cerebral infarction without residual deficits: Secondary | ICD-10-CM

## 2018-01-22 DIAGNOSIS — Z7901 Long term (current) use of anticoagulants: Secondary | ICD-10-CM | POA: Insufficient documentation

## 2018-01-22 DIAGNOSIS — I69312 Visuospatial deficit and spatial neglect following cerebral infarction: Secondary | ICD-10-CM | POA: Diagnosis not present

## 2018-01-22 DIAGNOSIS — I69398 Other sequelae of cerebral infarction: Secondary | ICD-10-CM | POA: Insufficient documentation

## 2018-01-22 DIAGNOSIS — R269 Unspecified abnormalities of gait and mobility: Secondary | ICD-10-CM | POA: Diagnosis not present

## 2018-01-22 DIAGNOSIS — E119 Type 2 diabetes mellitus without complications: Secondary | ICD-10-CM | POA: Insufficient documentation

## 2018-01-22 DIAGNOSIS — Z96643 Presence of artificial hip joint, bilateral: Secondary | ICD-10-CM | POA: Diagnosis not present

## 2018-01-22 DIAGNOSIS — M6281 Muscle weakness (generalized): Secondary | ICD-10-CM | POA: Diagnosis not present

## 2018-01-22 DIAGNOSIS — M069 Rheumatoid arthritis, unspecified: Secondary | ICD-10-CM | POA: Diagnosis not present

## 2018-01-22 NOTE — Progress Notes (Signed)
Subjective:    Patient ID: [name], female    DOB: 23-Oct-1945, 72 y.o.   MRN: [id number]  HPI 72 year old female with history atrial fibrillation, diabetes mellitus and rheumatoid arthritis.  She suffered right MCA PCA infarct 06/14/2017 which caused some left-sided visual field deficits.  She also had right lower extremity weakness in the femoral nerve distribution.  She had EMG/NCV which was incomplete but suggested peripheral nerve injury. Currently she is just taking Tylenol for pain.  She does have some right medial knee pain.  No falls no other injuries.  Ambulating at times without a walker daily and ambulate in with a walker. There is a meeting at the skilled nursing facility 02/06/2018 to discuss further plans. She does have follow-up with rheumatologist in November, Dr. Amil Amen, also has a neurology follow-up at [facility] neurologic clinic 05/10/18 20 Pain Inventory Average Pain 2 Pain Right Now 2 My pain is sharp  In the last 24 hours, has pain interfered with the following? General activity 2 Relation with others 2 Enjoyment of life 2 What TIME of day is your pain at its worst? na Sleep (in general) Good  Pain is worse with: na Pain improves with: na Relief from Meds: na  Mobility walk with assistance use a walker ability to climb steps?  no do you drive?  no use a wheelchair Do you have any goals in this area?  yes  Function retired  Neuro/Psych trouble walking  Prior Studies Any changes since last visit?  no  Physicians involved in your care Any changes since last visit?  no   Family History  Problem Relation Age of Onset  . Pneumonia Mother 22  . Stroke Father 85  . Heart failure Sister   . Hypertension Sister   . Heart attack Neg Hx    Social History   Socioeconomic History  . Marital status: Divorced    Spouse name: Not on file  . Number of children: Not on file  . Years of education: Not on file  . Highest education level: Not on file   Occupational History  . Occupation: Retired  Scientific laboratory technician  . Financial resource strain: Not on file  . Food insecurity:    Worry: Not on file    Inability: Not on file  . Transportation needs:    Medical: Not on file    Non-medical: Not on file  Tobacco Use  . Smoking status: Never Smoker  . Smokeless tobacco: Never Used  Substance and Sexual Activity  . Alcohol use: No    Alcohol/week: 0.0 standard drinks  . Drug use: No  . Sexual activity: Not Currently  Lifestyle  . Physical activity:    Days per week: Not on file    Minutes per session: Not on file  . Stress: Not on file  Relationships  . Social connections:    Talks on phone: Not on file    Gets together: Not on file    Attends religious service: Not on file    Active member of club or organization: Not on file    Attends meetings of clubs or organizations: Not on file    Relationship status: Not on file  Other Topics Concern  . Not on file  Social History Narrative  . Not on file   Past Surgical History:  Procedure Laterality Date  . CATARACT EXTRACTION Bilateral   . COLONOSCOPY WITH PROPOFOL N/A 02/20/2015   Procedure: COLONOSCOPY WITH PROPOFOL;  Surgeon: Carol Ada, MD;  Location: [facility];  Service: Endoscopy;  Laterality: N/A;  . ENTEROSCOPY N/A 03/23/2016   Procedure: ENTEROSCOPY;  Surgeon: Carol Ada, MD;  Location: [facility];  Service: Endoscopy;  Laterality: N/A;  . ENTEROSCOPY N/A 06/17/2016   Procedure: ENTEROSCOPY;  Surgeon: Carol Ada, MD;  Location: [facility];  Service: Endoscopy;  Laterality: N/A;  . ESOPHAGOGASTRODUODENOSCOPY (EGD) WITH PROPOFOL N/A 02/20/2015   Procedure: ESOPHAGOGASTRODUODENOSCOPY (EGD) WITH PROPOFOL;  Surgeon: Carol Ada, MD;  Location: [facility];  Service: Endoscopy;  Laterality: N/A;  . GIVENS CAPSULE STUDY N/A 03/21/2016   Procedure: GIVENS CAPSULE STUDY;  Surgeon: Carol Ada, MD;  Location: [facility];  Service: Endoscopy;  Laterality: N/A;  . HAND  SURGERY  1995 and 1996   artificial joints both hands  . JOINT REPLACEMENT    . LUMBAR LAMINECTOMY/DECOMPRESSION MICRODISCECTOMY N/A 02/10/2016   Procedure: MICROLUMBAR DECOMPRESSION L4-L5 AND L3- L4, AND EXCISION OF SYNOVIAL CYST L4-L5;  Surgeon: Susa Day, MD;  Location: [facility];  Service: Orthopedics;  Laterality: N/A;  . TOTAL HIP ARTHROPLASTY Left 2009  . TOTAL HIP ARTHROPLASTY Right 04/03/2013   Procedure: RIGHT TOTAL HIP ARTHROPLASTY ANTERIOR APPROACH;  Surgeon: Gearlean Alf, MD;  Location: [facility];  Service: Orthopedics;  Laterality: Right;   Past Medical History:  Diagnosis Date  . Abnormal LFTs 07/24/2017  . Actinic keratosis   . Anemia    hx of  . Anemia of chronic renal failure, stage 3 (moderate) ([place]) 07/17/2015  . Anticoagulant causing adverse effect in therapeutic use 07/17/2015  . Anxiety   . Arthritis    rheumatoid  . Atrial fibrillation ([place])    a. persistent, she remains on Xarelto  . Carpal tunnel syndrome   . Chronic diastolic (congestive) heart failure ([place])    a. 04/2016: echo showing a preserved EF of 55-60% with mild MR. LA and RA midly dilated.   . Edema    left leg at ankle resolved now  . Gastroesophageal reflux disease   . Hepatitis 1970   not sure what kind  . Hiatal hernia   . Hyperlipidemia   . Hypertension   . Iron deficiency anemia due to chronic blood loss 07/17/2015  . Jaundice    age 28  . Peripheral vascular disease ([place]) yrs ago   DVT left lower leg questionale told by 2 drs i had no clot, 1 md said i did  . Rheumatoid arthritis(714.0)   . Right knee pain   . Stroke ([place])   . Type II diabetes mellitus ([facility])    type2   BP 116/80   Pulse 93   Ht 5\' 7"  (1.702 m) Comment: previously  Wt 174 lb (78.9 kg) Comment: previously  SpO2 94%   BMI 27.25 kg/m   Opioid Risk Score:   Fall Risk Score:  `1  Depression screen PHQ 2/9  Depression screen PHQ 2/9 10/23/2017  Decreased Interest 0  Down, Depressed, Hopeless 0  PHQ - 2 Score 0    Some recent data might be hidden    Review of Systems  Constitutional: Negative.   HENT: Negative.   Eyes: Negative.   Respiratory: Negative.   Cardiovascular: Negative.   Gastrointestinal: Negative.   Endocrine: Negative.   Genitourinary: Negative.   Musculoskeletal: Positive for back pain and gait problem.  Skin: Negative.   Allergic/Immunologic: Negative.   Hematological: Negative.   Psychiatric/Behavioral: Negative.   All other systems reviewed and are negative.      Objective:   Physical Exam  Constitutional: She is  oriented to person, place, and time. She appears well-developed and well-nourished.  HENT:  Head: Normocephalic and atraumatic.  Eyes: Pupils are equal, round, and reactive to light. EOM are normal.  Neck: Normal range of motion.  Neurological: She is alert and oriented to person, place, and time.  Skin: Skin is warm and dry.  Psychiatric: She has a normal mood and affect. Her behavior is normal. Thought content normal.  Nursing note and vitals reviewed.  Motor strength is 5/5 bilateral deltoid bicep tricep grip, 5/5 in left hip flexor knee extensor ankle dorsiflexor 4+/5 in the right hip flexor knee extensor and 5/5 right ankle dorsiflexor.  Shoulder range of motion is full without pain     Assessment & Plan:  1.  History of right MCA/PCA distribution infarct 06/14/2017 with no residual left-sided weakness. 2.  Right lower extremity weakness secondary to femoral neuropathy versus L3 radiculopathy overall improved  Physical medicine rehab follow-up in 6 months Follow-up with neurology in February Follow-up with rheumatology next month At the current time she does not require any prescription pain medication for her RA pain

## 2018-01-30 DIAGNOSIS — M0589 Other rheumatoid arthritis with rheumatoid factor of multiple sites: Secondary | ICD-10-CM | POA: Diagnosis not present

## 2018-01-30 DIAGNOSIS — D500 Iron deficiency anemia secondary to blood loss (chronic): Secondary | ICD-10-CM | POA: Diagnosis not present

## 2018-01-30 DIAGNOSIS — I63411 Cerebral infarction due to embolism of right middle cerebral artery: Secondary | ICD-10-CM | POA: Diagnosis not present

## 2018-01-30 DIAGNOSIS — M2550 Pain in unspecified joint: Secondary | ICD-10-CM | POA: Diagnosis not present

## 2018-01-30 DIAGNOSIS — Z79899 Other long term (current) drug therapy: Secondary | ICD-10-CM | POA: Diagnosis not present

## 2018-01-30 DIAGNOSIS — Z7952 Long term (current) use of systemic steroids: Secondary | ICD-10-CM | POA: Diagnosis not present

## 2018-01-30 DIAGNOSIS — M5030 Other cervical disc degeneration, unspecified cervical region: Secondary | ICD-10-CM | POA: Diagnosis not present

## 2018-01-30 DIAGNOSIS — M25532 Pain in left wrist: Secondary | ICD-10-CM | POA: Diagnosis not present

## 2018-01-30 DIAGNOSIS — R600 Localized edema: Secondary | ICD-10-CM | POA: Diagnosis not present

## 2018-01-30 DIAGNOSIS — M25561 Pain in right knee: Secondary | ICD-10-CM | POA: Diagnosis not present

## 2018-02-10 DIAGNOSIS — M1990 Unspecified osteoarthritis, unspecified site: Secondary | ICD-10-CM | POA: Diagnosis not present

## 2018-02-10 DIAGNOSIS — M069 Rheumatoid arthritis, unspecified: Secondary | ICD-10-CM | POA: Diagnosis not present

## 2018-02-10 DIAGNOSIS — M25511 Pain in right shoulder: Secondary | ICD-10-CM | POA: Diagnosis not present

## 2018-02-10 DIAGNOSIS — M47812 Spondylosis without myelopathy or radiculopathy, cervical region: Secondary | ICD-10-CM | POA: Diagnosis not present

## 2018-02-14 DIAGNOSIS — I10 Essential (primary) hypertension: Secondary | ICD-10-CM | POA: Diagnosis not present

## 2018-02-14 DIAGNOSIS — M1990 Unspecified osteoarthritis, unspecified site: Secondary | ICD-10-CM | POA: Diagnosis not present

## 2018-02-14 DIAGNOSIS — Z79899 Other long term (current) drug therapy: Secondary | ICD-10-CM | POA: Diagnosis not present

## 2018-02-14 DIAGNOSIS — I5032 Chronic diastolic (congestive) heart failure: Secondary | ICD-10-CM | POA: Diagnosis not present

## 2018-02-14 DIAGNOSIS — R05 Cough: Secondary | ICD-10-CM | POA: Diagnosis not present

## 2018-02-14 DIAGNOSIS — M25511 Pain in right shoulder: Secondary | ICD-10-CM | POA: Diagnosis not present

## 2018-02-14 DIAGNOSIS — I509 Heart failure, unspecified: Secondary | ICD-10-CM | POA: Diagnosis not present

## 2018-02-14 DIAGNOSIS — I517 Cardiomegaly: Secondary | ICD-10-CM | POA: Diagnosis not present

## 2018-03-04 ENCOUNTER — Inpatient Hospital Stay (HOSPITAL_COMMUNITY)
Admission: EM | Admit: 2018-03-04 | Discharge: 2018-03-06 | DRG: 309 | Disposition: A | Discharge status: Skilled Nursing Facility | Payer: Medicare Other | Source: Skilled Nursing Facility | Attending: Internal Medicine | Admitting: Internal Medicine

## 2018-03-04 ENCOUNTER — Emergency Department (HOSPITAL_COMMUNITY): Payer: Medicare Other

## 2018-03-04 ENCOUNTER — Other Ambulatory Visit: Payer: Self-pay

## 2018-03-04 ENCOUNTER — Other Ambulatory Visit: Payer: Self-pay | Admitting: Cardiology

## 2018-03-04 ENCOUNTER — Encounter (HOSPITAL_COMMUNITY): Payer: Self-pay

## 2018-03-04 ENCOUNTER — Inpatient Hospital Stay (HOSPITAL_COMMUNITY): Payer: Medicare Other

## 2018-03-04 DIAGNOSIS — Z79899 Other long term (current) drug therapy: Secondary | ICD-10-CM | POA: Diagnosis not present

## 2018-03-04 DIAGNOSIS — Z7984 Long term (current) use of oral hypoglycemic drugs: Secondary | ICD-10-CM | POA: Diagnosis not present

## 2018-03-04 DIAGNOSIS — E1151 Type 2 diabetes mellitus with diabetic peripheral angiopathy without gangrene: Secondary | ICD-10-CM | POA: Diagnosis present

## 2018-03-04 DIAGNOSIS — I959 Hypotension, unspecified: Secondary | ICD-10-CM | POA: Diagnosis not present

## 2018-03-04 DIAGNOSIS — M069 Rheumatoid arthritis, unspecified: Secondary | ICD-10-CM | POA: Diagnosis present

## 2018-03-04 DIAGNOSIS — L03211 Cellulitis of face: Secondary | ICD-10-CM | POA: Diagnosis present

## 2018-03-04 DIAGNOSIS — L0390 Cellulitis, unspecified: Secondary | ICD-10-CM | POA: Diagnosis not present

## 2018-03-04 DIAGNOSIS — F329 Major depressive disorder, single episode, unspecified: Secondary | ICD-10-CM | POA: Diagnosis present

## 2018-03-04 DIAGNOSIS — Z7901 Long term (current) use of anticoagulants: Secondary | ICD-10-CM

## 2018-03-04 DIAGNOSIS — E1169 Type 2 diabetes mellitus with other specified complication: Secondary | ICD-10-CM

## 2018-03-04 DIAGNOSIS — Z8673 Personal history of transient ischemic attack (TIA), and cerebral infarction without residual deficits: Secondary | ICD-10-CM | POA: Diagnosis not present

## 2018-03-04 DIAGNOSIS — E1122 Type 2 diabetes mellitus with diabetic chronic kidney disease: Secondary | ICD-10-CM | POA: Diagnosis present

## 2018-03-04 DIAGNOSIS — E785 Hyperlipidemia, unspecified: Secondary | ICD-10-CM | POA: Diagnosis present

## 2018-03-04 DIAGNOSIS — Z791 Long term (current) use of non-steroidal anti-inflammatories (NSAID): Secondary | ICD-10-CM

## 2018-03-04 DIAGNOSIS — R220 Localized swelling, mass and lump, head: Secondary | ICD-10-CM | POA: Diagnosis not present

## 2018-03-04 DIAGNOSIS — N183 Chronic kidney disease, stage 3 (moderate): Secondary | ICD-10-CM | POA: Diagnosis present

## 2018-03-04 DIAGNOSIS — I10 Essential (primary) hypertension: Secondary | ICD-10-CM | POA: Diagnosis not present

## 2018-03-04 DIAGNOSIS — E11319 Type 2 diabetes mellitus with unspecified diabetic retinopathy without macular edema: Secondary | ICD-10-CM

## 2018-03-04 DIAGNOSIS — F419 Anxiety disorder, unspecified: Secondary | ICD-10-CM | POA: Diagnosis present

## 2018-03-04 DIAGNOSIS — J310 Chronic rhinitis: Secondary | ICD-10-CM | POA: Diagnosis present

## 2018-03-04 DIAGNOSIS — Z96643 Presence of artificial hip joint, bilateral: Secondary | ICD-10-CM | POA: Diagnosis present

## 2018-03-04 DIAGNOSIS — I5032 Chronic diastolic (congestive) heart failure: Secondary | ICD-10-CM | POA: Diagnosis present

## 2018-03-04 DIAGNOSIS — I4891 Unspecified atrial fibrillation: Secondary | ICD-10-CM | POA: Diagnosis not present

## 2018-03-04 DIAGNOSIS — Z7952 Long term (current) use of systemic steroids: Secondary | ICD-10-CM

## 2018-03-04 DIAGNOSIS — Z86718 Personal history of other venous thrombosis and embolism: Secondary | ICD-10-CM

## 2018-03-04 DIAGNOSIS — R600 Localized edema: Secondary | ICD-10-CM | POA: Diagnosis not present

## 2018-03-04 DIAGNOSIS — R739 Hyperglycemia, unspecified: Secondary | ICD-10-CM | POA: Diagnosis not present

## 2018-03-04 DIAGNOSIS — E119 Type 2 diabetes mellitus without complications: Secondary | ICD-10-CM

## 2018-03-04 DIAGNOSIS — I130 Hypertensive heart and chronic kidney disease with heart failure and stage 1 through stage 4 chronic kidney disease, or unspecified chronic kidney disease: Secondary | ICD-10-CM | POA: Diagnosis present

## 2018-03-04 DIAGNOSIS — E1165 Type 2 diabetes mellitus with hyperglycemia: Secondary | ICD-10-CM | POA: Diagnosis present

## 2018-03-04 DIAGNOSIS — M05752 Rheumatoid arthritis with rheumatoid factor of left hip without organ or systems involvement: Secondary | ICD-10-CM | POA: Diagnosis not present

## 2018-03-04 DIAGNOSIS — R609 Edema, unspecified: Secondary | ICD-10-CM | POA: Diagnosis not present

## 2018-03-04 DIAGNOSIS — R000 Tachycardia, unspecified: Secondary | ICD-10-CM | POA: Diagnosis not present

## 2018-03-04 DIAGNOSIS — K219 Gastro-esophageal reflux disease without esophagitis: Secondary | ICD-10-CM | POA: Diagnosis present

## 2018-03-04 DIAGNOSIS — I482 Chronic atrial fibrillation, unspecified: Secondary | ICD-10-CM | POA: Diagnosis not present

## 2018-03-04 DIAGNOSIS — R221 Localized swelling, mass and lump, neck: Secondary | ICD-10-CM | POA: Diagnosis not present

## 2018-03-04 DIAGNOSIS — R52 Pain, unspecified: Secondary | ICD-10-CM | POA: Diagnosis not present

## 2018-03-04 DIAGNOSIS — Z7401 Bed confinement status: Secondary | ICD-10-CM | POA: Diagnosis not present

## 2018-03-04 LAB — COMPREHENSIVE METABOLIC PANEL
ALT: 19 U/L (ref 0–44)
ANION GAP: 12 (ref 5–15)
AST: 17 U/L (ref 15–41)
Albumin: 3.7 g/dL (ref 3.5–5.0)
Alkaline Phosphatase: 114 U/L (ref 38–126)
BILIRUBIN TOTAL: 1.7 mg/dL — AB (ref 0.3–1.2)
BUN: 23 mg/dL (ref 8–23)
CO2: 24 mmol/L (ref 22–32)
Calcium: 9.3 mg/dL (ref 8.9–10.3)
Chloride: 97 mmol/L — ABNORMAL LOW (ref 98–111)
Creatinine, Ser: 1.02 mg/dL — ABNORMAL HIGH (ref 0.44–1.00)
GFR calc non Af Amer: 55 mL/min — ABNORMAL LOW (ref >60)
Glucose, Bld: 432 mg/dL — ABNORMAL HIGH (ref 70–99)
POTASSIUM: 5 mmol/L (ref 3.5–5.1)
Sodium: 133 mmol/L — ABNORMAL LOW (ref 135–145)
Total Protein: 7.9 g/dL (ref 6.5–8.1)

## 2018-03-04 LAB — TROPONIN I
TROPONIN I: 0.03 ng/mL — AB (ref <0.03)
Troponin I: <0.03 ng/mL (ref <0.03)

## 2018-03-04 LAB — CBC WITH DIFFERENTIAL/PLATELET
ABS IMMATURE GRANULOCYTES: 0.14 10*3/uL — AB (ref 0.00–0.07)
Basophils Absolute: 0.1 10*3/uL (ref 0.0–0.1)
Basophils Relative: 0 %
Eosinophils Absolute: 0.1 10*3/uL (ref 0.0–0.5)
Eosinophils Relative: 0 %
HCT: 46.4 % — ABNORMAL HIGH (ref 36.0–46.0)
Hemoglobin: 13.8 g/dL (ref 12.0–15.0)
Immature Granulocytes: 1 %
Lymphocytes Relative: 6 %
Lymphs Abs: 0.9 10*3/uL (ref 0.7–4.0)
MCH: 25.9 pg — AB (ref 26.0–34.0)
MCHC: 29.7 g/dL — AB (ref 30.0–36.0)
MCV: 87.1 fL (ref 80.0–100.0)
MONO ABS: 0.9 10*3/uL (ref 0.1–1.0)
Monocytes Relative: 6 %
NEUTROS ABS: 12.6 10*3/uL — AB (ref 1.7–7.7)
Neutrophils Relative %: 87 %
PLATELETS: 236 10*3/uL (ref 150–400)
RBC: 5.33 MIL/uL — ABNORMAL HIGH (ref 3.87–5.11)
RDW: 17.2 % — ABNORMAL HIGH (ref 11.5–15.5)
WBC: 14.7 10*3/uL — AB (ref 4.0–10.5)
nRBC: 0 % (ref 0.0–0.2)

## 2018-03-04 LAB — BRAIN NATRIURETIC PEPTIDE: B Natriuretic Peptide: 353 pg/mL — ABNORMAL HIGH (ref 0.0–100.0)

## 2018-03-04 LAB — GLUCOSE, CAPILLARY: Glucose-Capillary: 274 mg/dL — ABNORMAL HIGH (ref 70–99)

## 2018-03-04 LAB — CBG MONITORING, ED
GLUCOSE-CAPILLARY: 404 mg/dL — AB (ref 70–99)
Glucose-Capillary: 320 mg/dL — ABNORMAL HIGH (ref 70–99)

## 2018-03-04 MED ORDER — INSULIN ASPART 100 UNIT/ML ~~LOC~~ SOLN
5.0000 [IU] | Freq: Once | SUBCUTANEOUS | Status: AC
  Administered 2018-03-04: 5 [IU] via SUBCUTANEOUS

## 2018-03-04 MED ORDER — METOPROLOL TARTRATE 50 MG PO TABS: 100.0000 mg | ORAL_TABLET | Freq: Two times a day (BID) | ORAL | Status: DC

## 2018-03-04 MED ORDER — PREDNISONE 10 MG PO TABS
5.0000 mg | ORAL_TABLET | Freq: Every day | ORAL | Status: DC
  Administered 2018-03-05 – 2018-03-06 (×2): 5 mg via ORAL
  Filled 2018-03-04 (×2): qty 1

## 2018-03-04 MED ORDER — ONDANSETRON HCL 4 MG PO TABS: 4.0000 mg | ORAL_TABLET | Freq: Four times a day (QID) | ORAL | Status: DC | PRN

## 2018-03-04 MED ORDER — ACETAMINOPHEN 325 MG PO TABS: 650.0000 mg | ORAL_TABLET | Freq: Four times a day (QID) | ORAL | Status: DC | PRN

## 2018-03-04 MED ORDER — NYSTATIN 100000 UNIT/GM EX POWD
Freq: Three times a day (TID) | CUTANEOUS | Status: DC
  Administered 2018-03-04 – 2018-03-06 (×5): via TOPICAL
  Filled 2018-03-04: qty 15

## 2018-03-04 MED ORDER — SODIUM CHLORIDE 0.9 % IV BOLUS
1000.0000 mL | Freq: Once | INTRAVENOUS | Status: AC
  Administered 2018-03-04: 1000 mL via INTRAVENOUS

## 2018-03-04 MED ORDER — IOPAMIDOL (ISOVUE-300) INJECTION 61%
75.0000 mL | Freq: Once | INTRAVENOUS | Status: AC | PRN
  Administered 2018-03-04: 75 mL via INTRAVENOUS

## 2018-03-04 MED ORDER — PANTOPRAZOLE SODIUM 40 MG PO TBEC
40.0000 mg | DELAYED_RELEASE_TABLET | Freq: Every day | ORAL | Status: DC
  Administered 2018-03-05 – 2018-03-06 (×2): 40 mg via ORAL
  Filled 2018-03-04 (×2): qty 1

## 2018-03-04 MED ORDER — BUPROPION HCL ER (SR) 150 MG PO TB12
150.0000 mg | ORAL_TABLET | Freq: Two times a day (BID) | ORAL | Status: DC
  Administered 2018-03-04 – 2018-03-06 (×4): 150 mg via ORAL
  Filled 2018-03-04 (×4): qty 1

## 2018-03-04 MED ORDER — OXYCODONE HCL 5 MG PO TABS
5.0000 mg | ORAL_TABLET | Freq: Four times a day (QID) | ORAL | Status: DC | PRN
  Administered 2018-03-04 – 2018-03-06 (×5): 5 mg via ORAL
  Filled 2018-03-04 (×11): qty 1

## 2018-03-04 MED ORDER — RIVAROXABAN 15 MG PO TABS
15.0000 mg | ORAL_TABLET | Freq: Every day | ORAL | Status: DC
  Administered 2018-03-04 – 2018-03-06 (×3): 15 mg via ORAL
  Filled 2018-03-04 (×4): qty 1

## 2018-03-04 MED ORDER — GABAPENTIN 300 MG PO CAPS
300.0000 mg | ORAL_CAPSULE | Freq: Three times a day (TID) | ORAL | Status: DC
  Administered 2018-03-04 – 2018-03-06 (×5): 300 mg via ORAL
  Filled 2018-03-04 (×5): qty 1

## 2018-03-04 MED ORDER — QUETIAPINE FUMARATE 25 MG PO TABS: 25.0000 mg | ORAL_TABLET | Freq: Three times a day (TID) | ORAL | Status: DC | PRN

## 2018-03-04 MED ORDER — SODIUM CHLORIDE 0.9 % IV SOLN
1.0000 g | INTRAVENOUS | Status: DC
  Administered 2018-03-04 – 2018-03-05 (×2): 1 g via INTRAVENOUS
  Filled 2018-03-04: qty 1
  Filled 2018-03-04 (×3): qty 10

## 2018-03-04 MED ORDER — DILTIAZEM HCL-DEXTROSE 100-5 MG/100ML-% IV SOLN (PREMIX)
INTRAVENOUS | Status: AC
  Filled 2018-03-04: qty 100

## 2018-03-04 MED ORDER — SODIUM CHLORIDE 0.9 % IV BOLUS
500.0000 mL | Freq: Once | INTRAVENOUS | Status: AC
  Administered 2018-03-04: 500 mL via INTRAVENOUS

## 2018-03-04 MED ORDER — ACETAMINOPHEN 650 MG RE SUPP: 650.0000 mg | Freq: Four times a day (QID) | RECTAL | Status: DC | PRN

## 2018-03-04 MED ORDER — POLYETHYLENE GLYCOL 3350 17 G PO PACK: 17.0000 g | PACK | Freq: Every day | ORAL | Status: DC | PRN

## 2018-03-04 MED ORDER — ONDANSETRON HCL 4 MG/2ML IJ SOLN: 4.0000 mg | Freq: Four times a day (QID) | INTRAMUSCULAR | Status: DC | PRN

## 2018-03-04 MED ORDER — SODIUM CHLORIDE 0.9 % IV SOLN
INTRAVENOUS | Status: DC | PRN
  Administered 2018-03-04: 500 mL via INTRAVENOUS

## 2018-03-04 MED ORDER — INSULIN ASPART 100 UNIT/ML ~~LOC~~ SOLN
0.0000 [IU] | Freq: Three times a day (TID) | SUBCUTANEOUS | Status: DC
  Administered 2018-03-05: 4 [IU] via SUBCUTANEOUS
  Administered 2018-03-05: 5 [IU] via SUBCUTANEOUS
  Administered 2018-03-05: 3 [IU] via SUBCUTANEOUS
  Administered 2018-03-06: 2 [IU] via SUBCUTANEOUS
  Administered 2018-03-06: 1 [IU] via SUBCUTANEOUS

## 2018-03-04 MED ORDER — GLIMEPIRIDE 2 MG PO TABS
2.0000 mg | ORAL_TABLET | Freq: Every day | ORAL | Status: DC
  Administered 2018-03-05 – 2018-03-06 (×2): 2 mg via ORAL
  Filled 2018-03-04 (×2): qty 1

## 2018-03-04 MED ORDER — DILTIAZEM HCL-DEXTROSE 100-5 MG/100ML-% IV SOLN (PREMIX)
5.0000 mg/h | INTRAVENOUS | Status: DC
  Administered 2018-03-04: 5 mg/h via INTRAVENOUS
  Administered 2018-03-05 (×2): 15 mg/h via INTRAVENOUS
  Filled 2018-03-04 (×2): qty 100

## 2018-03-04 MED ORDER — DILTIAZEM LOAD VIA INFUSION
10.0000 mg | Freq: Once | INTRAVENOUS | Status: AC
  Administered 2018-03-04: 10 mg via INTRAVENOUS
  Filled 2018-03-04: qty 10

## 2018-03-04 NOTE — ED Provider Notes (Signed)
[facility] EMERGENCY DEPARTMENT Provider Note   CSN: [id number] Arrival date & time: 03/04/18  1258     History   Chief Complaint Chief Complaint  Patient presents with  . Tachycardia    HPI [name] is a 72 y.o. female.  HPI    The patient presents for evaluation of swelling of her face.  During transport EMS noticed that she was tachycardic and hypotensive.  Patient states the swelling of her face started yesterday.  She states that this was preceded by some yellow drainage in her left eye 2 days ago, and she rubbed it to get drainage out.  She denies shortness of breath, nausea, vomiting, chest pain, weakness or dizziness.  She has chronic atrial fibrillation.  She denies other recent illnesses including fever, vomiting, cough or back pain.  There are no other no modifying factors.  Past Medical History:  Diagnosis Date  . Abnormal LFTs 07/24/2017  . Actinic keratosis   . Anemia    hx of  . Anemia of chronic renal failure, stage 3 (moderate) ([place]) 07/17/2015  . Anticoagulant causing adverse effect in therapeutic use 07/17/2015  . Anxiety   . Arthritis    rheumatoid  . Atrial fibrillation ([place])    a. persistent, she remains on Xarelto  . Carpal tunnel syndrome   . Chronic diastolic (congestive) heart failure ([place])    a. 04/2016: echo showing a preserved EF of 55-60% with mild MR. LA and RA midly dilated.   . Edema    left leg at ankle resolved now  . Gastroesophageal reflux disease   . Hepatitis 1970   not sure what kind  . Hiatal hernia   . Hyperlipidemia   . Hypertension   . Iron deficiency anemia due to chronic blood loss 07/17/2015  . Jaundice    age 38  . Peripheral vascular disease ([place]) yrs ago   DVT left lower leg questionale told by 2 drs i had no clot, 1 md said i did  . Rheumatoid arthritis(714.0)   . Right knee pain   . Stroke ([place])   . Type II diabetes mellitus ([place])    type2    Patient Active Problem List   Diagnosis Date Noted  . Sepsis  ([place]) 10/04/2017  . HCAP (healthcare-associated pneumonia) 10/04/2017  . Acute lower UTI 10/04/2017  . Hallucinations 07/25/2017  . Abnormal LFTs 07/24/2017  . Acute on chronic diastolic CHF (congestive heart failure) ([place])   . Elevated LFTs   . AKI (acute kidney injury) ([place])   . Elevated troponin   . Stroke, acute, embolic ([place]) 33/35/4562  . Mitral regurgitation 06/17/2017  . Acute CVA (cerebrovascular accident) ([place])   . Thyroid nodule 06/15/2017  . Atrial fibrillation with RVR ([place]) 06/14/2017  . CVA (cerebral vascular accident) ([place]) 06/14/2017  . Type II diabetes mellitus ([place])   . Peripheral vascular disease ([place])   . Hyperlipidemia   . Hiatal hernia   . Edema   . Chronic diastolic (congestive) heart failure ([place])   . Carpal tunnel syndrome   . Atrial fibrillation ([place])   . Anemia   . Actinic keratosis   . Intractable nausea and vomiting 05/31/2016  . Nausea & vomiting 05/30/2016  . Abnormal chest x-ray 05/30/2016  . Diabetes mellitus with complication ([place])   . Influenza A 05/21/2016  . Chronic atrial fibrillation 05/20/2016  . Acute CHF (congestive heart failure) ([place]) 05/20/2016  . History of GI bleed 03/29/2016  . Chronic anticoagulation 03/29/2016  .  Symptomatic anemia 03/19/2016  . Spinal stenosis of lumbar region 02/10/2016  . Varicose veins of left leg with edema 09/15/2015  . Preop cardiovascular exam 08/25/2015  . Iron deficiency anemia due to chronic blood loss 07/17/2015  . Anemia of chronic renal failure, stage 3 (moderate) ([place]) 07/17/2015  . Anticoagulant causing adverse effect in therapeutic use 07/17/2015  . Symptomatic bradycardia 02/21/2015  . Bradycardia   . Acute deep vein thrombosis (DVT) of left lower extremity ([place]) 05/06/2014  . Abdominal wall mass of right lower quadrant 07/08/2013  . OA (osteoarthritis) of hip 04/03/2013  . Osteoarthritis of hip 01/18/2013  . Night sweats 09/06/2012  . Chest pain   . Anxiety and depression   .  Gastroesophageal reflux disease   . Hepatitis   . Jaundice   . Hypertension 07/01/2010  . Hypercholesterolemia 07/01/2010  . DM2 (diabetes mellitus, type 2) ([place]) 07/01/2010  . Rheumatoid arthritis ([place]) 07/01/2010  . Sinus tachycardia 07/01/2010  . Chest pain, atypical 07/01/2010    Past Surgical History:  Procedure Laterality Date  . CATARACT EXTRACTION Bilateral   . COLONOSCOPY WITH PROPOFOL N/A 02/20/2015   Procedure: COLONOSCOPY WITH PROPOFOL;  Surgeon: Carol Ada, MD;  Location: [facility] ENDOSCOPY;  Service: Endoscopy;  Laterality: N/A;  . ENTEROSCOPY N/A 03/23/2016   Procedure: ENTEROSCOPY;  Surgeon: Carol Ada, MD;  Location: [facility];  Service: Endoscopy;  Laterality: N/A;  . ENTEROSCOPY N/A 06/17/2016   Procedure: ENTEROSCOPY;  Surgeon: Carol Ada, MD;  Location: [facility];  Service: Endoscopy;  Laterality: N/A;  . ESOPHAGOGASTRODUODENOSCOPY (EGD) WITH PROPOFOL N/A 02/20/2015   Procedure: ESOPHAGOGASTRODUODENOSCOPY (EGD) WITH PROPOFOL;  Surgeon: Carol Ada, MD;  Location: [facility];  Service: Endoscopy;  Laterality: N/A;  . GIVENS CAPSULE STUDY N/A 03/21/2016   Procedure: GIVENS CAPSULE STUDY;  Surgeon: Carol Ada, MD;  Location: [facility];  Service: Endoscopy;  Laterality: N/A;  . HAND SURGERY  1995 and 1996   artificial joints both hands  . JOINT REPLACEMENT    . LUMBAR LAMINECTOMY/DECOMPRESSION MICRODISCECTOMY N/A 02/10/2016   Procedure: MICROLUMBAR DECOMPRESSION L4-L5 AND L3- L4, AND EXCISION OF SYNOVIAL CYST L4-L5;  Surgeon: Susa Day, MD;  Location: [facility];  Service: Orthopedics;  Laterality: N/A;  . TOTAL HIP ARTHROPLASTY Left 2009  . TOTAL HIP ARTHROPLASTY Right 04/03/2013   Procedure: RIGHT TOTAL HIP ARTHROPLASTY ANTERIOR APPROACH;  Surgeon: Gearlean Alf, MD;  Location: [facility];  Service: Orthopedics;  Laterality: Right;     OB History   None      Home Medications    Prior to Admission medications   Medication Sig Start Date End Date  Taking? Authorizing Provider  Abatacept (ORENCIA CLICKJECT) 315 MG/ML SOAJ Inject 125 mg into the skin every Thursday.    Yes [provider]  buPROPion (WELLBUTRIN SR) 150 MG 12 hr tablet Take 150 mg by mouth 2 (two) times daily.     Yes [provider]  Cholecalciferol (VITAMIN D3) 1000 units CAPS Take 1,000 Units by mouth every evening.    Yes [provider]  ferrous gluconate (FERGON) 324 MG tablet Take 1 tablet (324 mg total) by mouth 2 (two) times daily with a meal. 05/17/16  Yes Rizwan, Eunice Blase, MD  furosemide (LASIX) 40 MG tablet Take 40 mg by mouth daily.    Yes [provider]  gabapentin (NEURONTIN) 100 MG capsule Take 3 capsules (300 mg total) by mouth 3 (three) times daily. 10/10/17  Yes Kathie Dike, MD  glimepiride (AMARYL) 2 MG tablet TAKE 1 TABLET  DAILY BEFORE BREAKFAST 07/31/17  Yes [provider]  leflunomide (ARAVA) 10 MG tablet Take 10 mg by mouth daily.   Yes [provider]  lidocaine (LIDODERM) 5 % Place 1 patch onto the skin daily. Remove & Discard patch within 12 hours or as directed by MD   Yes [provider]  metFORMIN (GLUCOPHAGE-XR) 500 MG 24 hr tablet Take 500 mg by mouth 2 (two) times daily.  07/31/17  Yes [provider]  metoprolol tartrate (LOPRESSOR) 50 MG tablet TAKE 2 TABLETS (100 MG TOTAL) BY MOUTH 2 (TWO) TIMES DAILY. 08/22/17  Yes Kilroy, Luke K, PA-C  Multiple Vitamins-Minerals (PRESERVISION AREDS 2) CAPS Take 1 capsule by mouth daily.    Yes [provider]  nitroGLYCERIN (NITROSTAT) 0.4 MG SL tablet Place 1 tablet (0.4 mg total) under the tongue every 5 (five) minutes as needed for chest pain (x 3 doses). 11/28/16  Yes Lelon Perla, MD  omeprazole (PRILOSEC) 20 MG capsule Take 1 capsule by mouth daily. 02/12/18  Yes [provider]  oxycodone (OXY-IR) 5 MG capsule Take 5 mg by mouth every 6 (six) hours as needed for pain.   Yes [provider]  potassium  chloride SA (K-DUR,KLOR-CON) 20 MEQ tablet Take 1 tablet (20 mEq total) by mouth 2 (two) times daily. 10/10/17  Yes Kathie Dike, MD  predniSONE (DELTASONE) 5 MG tablet Take 5 mg by mouth daily.   Yes [provider]  QUEtiapine (SEROQUEL) 25 MG tablet Take 1 tablet (25 mg total) by mouth 3 (three) times daily as needed (delerium or aggitation). 07/27/17  Yes Winfrey, Alcario Drought, MD  Rivaroxaban (XARELTO) 15 MG TABS tablet Take 1 tablet (15 mg total) by mouth daily. 07/28/17  Yes Winfrey, Alcario Drought, MD  sennosides-docusate sodium (SENOKOT-S) 8.6-50 MG tablet Take 1 tablet by mouth daily.   Yes [provider]  triamcinolone cream (KENALOG) 0.1 % Apply 1 application topically 2 (two) times daily.   Yes [provider]  VASCEPA 1 g CAPS Take 1 capsule by mouth every morning. 02/25/18  Yes [provider]  vitamin B-12 1000 MCG tablet Take 1 tablet (1,000 mcg total) by mouth every evening. Patient taking differently: Take 2,000 mcg by mouth every evening.  07/05/17  Yes Love, Ivan Anchors, PA-C  VOLTAREN 1 % GEL Apply 2 g topically at bedtime as needed for pain. Knees, calf 04/27/17  Yes [provider]    Family History Family History  Problem Relation Age of Onset  . Pneumonia Mother 57  . Stroke Father 51  . Heart failure Sister   . Hypertension Sister   . Heart attack Neg Hx     Social History Social History   Tobacco Use  . Smoking status: Never Smoker  . Smokeless tobacco: Never Used  Substance Use Topics  . Alcohol use: No    Alcohol/week: 0.0 standard drinks  . Drug use: No     Allergies   Promethazine   Review of Systems Review of Systems  All other systems reviewed and are negative.    Physical Exam Updated Vital Signs BP 131/78   Pulse (!) 103   Resp 20   SpO2 94%   Physical Exam  Constitutional: She is oriented to person, place, and time. She appears well-developed. No distress.  Elderly, frail  HENT:  Head:  Normocephalic and atraumatic.  Oral mucous membranes are dry.  Left nose and left medial periorbital tissue is mildly indurated, red and swollen.  This has the appearance of angioedema which is nonspecific.  There is no lingual or oral or oropharynx swelling.  Eyes: Pupils are equal, round, and reactive to light. Conjunctivae and EOM are normal.  Neck: Normal range of motion and phonation normal. Neck supple.  Cardiovascular:  Irregular tachycardia.  JVD is present.  Pulmonary/Chest: Effort normal and breath sounds normal. No stridor. No respiratory distress. She exhibits no tenderness.  Abdominal: Soft. She exhibits no distension. There is no tenderness. There is no guarding.  Musculoskeletal: Normal range of motion. She exhibits no edema, tenderness or deformity.  Neurological: She is alert and oriented to person, place, and time. She exhibits normal muscle tone.  Skin: Skin is warm and dry.  Psychiatric: She has a normal mood and affect. Her behavior is normal. Judgment and thought content normal.  Nursing note and vitals reviewed.    ED Treatments / Results  Labs (all labs ordered are listed, but only abnormal results are displayed) Labs Reviewed  COMPREHENSIVE METABOLIC PANEL - Abnormal; Notable for the following components:      Result Value   Sodium 133 (*)    Chloride 97 (*)    Glucose, Bld 432 (*)    Creatinine, Ser 1.02 (*)    Total Bilirubin 1.7 (*)    GFR calc non Af Amer 55 (*)    All other components within normal limits  CBC WITH DIFFERENTIAL/PLATELET - Abnormal; Notable for the following components:   WBC 14.7 (*)    RBC 5.33 (*)    HCT 46.4 (*)    MCH 25.9 (*)    MCHC 29.7 (*)    RDW 17.2 (*)    Neutro Abs 12.6 (*)    Abs Immature Granulocytes 0.14 (*)    All other components within normal limits  BRAIN NATRIURETIC PEPTIDE - Abnormal; Notable for the following components:   B Natriuretic Peptide 353.0 (*)    All other components within normal limits  CBG  MONITORING, ED - Abnormal; Notable for the following components:   Glucose-Capillary 404 (*)    All other components within normal limits  CBG MONITORING, ED - Abnormal; Notable for the following components:   Glucose-Capillary 320 (*)    All other components within normal limits  TROPONIN I  URINALYSIS, ROUTINE W REFLEX MICROSCOPIC    EKG EKG Interpretation  Date/Time:  Sunday March 04 2018 13:06:39 EST Ventricular Rate:  182 PR Interval:    QRS Duration: 72 QT Interval:  296 QTC Calculation: 516 R Axis:   72 Text Interpretation:  Atrial fibrillation with rapid V-rate Anteroseptal infarct, age indeterminate ST depression, probably rate related Baseline wander in lead(s) III since last tracing no significant change Confirmed by Daleen Bo [phone]) on 03/04/2018 1:27:12 PM  CHA2DS2/VAS Stroke Risk Points  Current as of 26 minutes ago     7  >= 2 Points: High Risk  1 - 1.99 Points: Medium Risk  0 Points: Low Risk    This is the only CHA2DS2/VAS Stroke Risk Points available for the past  year.:  Last Change: N/A     Details    This score determines the patient's risk of having a stroke if the  patient has atrial fibrillation.       Points Metrics  1 Has Congestive Heart Failure:  Yes    Current as of 26 minutes ago  0 Has Vascular Disease:  No    Current as of 26 minutes ago  1 Has Hypertension:  Yes  Current as of 26 minutes ago  1 Age:  49    Current as of 26 minutes ago  1 Has Diabetes:  Yes    Current as of 26 minutes ago  2 Had Stroke:  Yes  Had TIA:  No  Had thromboembolism:  Yes    Current as of 26 minutes ago  1 Female:  Yes    Current as of 26 minutes ago           Radiology Dg Chest Port 1 View  Result Date: 03/04/2018 CLINICAL DATA:  Left eye redness and swelling since yesterday. EXAM: PORTABLE CHEST 1 VIEW COMPARISON:  Single-view of the chest 10/06/2017. PA and lateral chest 07/11/2016. FINDINGS: The lungs are clear. Heart size is upper  normal. No pneumothorax or pleural fluid. No acute or focal bony abnormality. IMPRESSION: No acute disease. Electronically Signed   By: Inge Rise M.D.   On: 03/04/2018 14:10    Procedures .Critical Care Performed by: Daleen Bo, MD Authorized by: Daleen Bo, MD   Critical care provider statement:    Critical care time (minutes):  45   Critical care start time:  03/04/2018 1:10 PM   Critical care end time:  03/04/2018 4:29 PM   Critical care time was exclusive of:  Separately billable procedures and treating other patients   Critical care was necessary to treat or prevent imminent or life-threatening deterioration of the following conditions:  Cardiac failure   Critical care was time spent personally by me on the following activities:  Blood draw for specimens, development of treatment plan with patient or surrogate, discussions with consultants, evaluation of patient's response to treatment, examination of patient, obtaining history from patient or surrogate, ordering and performing treatments and interventions, ordering and review of laboratory studies, pulse oximetry, re-evaluation of patient's condition, review of old charts and ordering and review of radiographic studies   (including critical care time)  Medications Ordered in ED Medications  diltiazem (CARDIZEM) 1 mg/mL load via infusion 10 mg (10 mg Intravenous Bolus from Bag 03/04/18 1345)    And  diltiazem (CARDIZEM) 100 mg in dextrose 5% 119mL (1 mg/mL) infusion (5 mg/hr Intravenous New Bag/Given 03/04/18 1344)  dilTIAZem HCl-Dextrose 100-5 MG/100ML-% infusion SOLN (has no administration in time range)  sodium chloride 0.9 % bolus 500 mL (0 mLs Intravenous Stopped 03/04/18 1429)  sodium chloride 0.9 % bolus 1,000 mL (1,000 mLs Intravenous New Bag/Given 03/04/18 1455)     Initial Impression / Assessment and Plan / ED Course  I have reviewed the triage vital signs and the nursing notes.  Pertinent labs & imaging  results that were available during my care of the patient were reviewed by me and considered in my medical decision making (see chart for details).  Clinical Course as of Mar 04 1641  Sun Mar 04, 2018  1611 Mild elevation  Brain natriuretic peptide(!) [EW]  1611 Normal except white count high, RBCs high, MCH low  CBC with Differential(!) [EW]  1611 Normal  Troponin I - Once [EW]  1611 Normal except sodium low, chloride low, glucose high, creatinine slightly elevated, total bilirubin high, GFR low  Comprehensive metabolic panel(!) [EW]    Clinical Course User Index [EW] Daleen Bo, MD     Patient Vitals for the past 24 hrs:  BP Pulse Resp SpO2  03/04/18 1530 131/78 (!) 103 - 94 %  03/04/18 1500 110/71 (!) 103 20 91 %  03/04/18 1430 106/67 (!) 131 20 90 %  03/04/18  1330 114/77 82 (!) 35 97 %  03/04/18 1309 119/74 (!) 168 (!) 27 93 %    4:13 PM Reevaluation with update and discussion. After initial assessment and treatment, an updated evaluation reveals patient is comfortable has no further complaints.  Findings discussed and questions answered. Daleen Bo   Medical Decision Making: Rrapid atrial fibrillation, preceding atrial fibrillation, anticoagulated currently.  Patient likely dehydrated with hyperglycemia as part of the etiology.  Nonspecific facial swelling without significant intraoral or airway angioedema.  No evidence for ketosis or DKA.  Patient will require admission for stabilization in the observed hospital setting.  CRITICAL CARE-yes Performed by: Daleen Bo  Nursing Notes Reviewed/ Care Coordinated Applicable Imaging Reviewed Interpretation of Laboratory Data incorporated into ED treatment   4:29 PM-Consult complete with hospitalist. Patient case explained and discussed.  She agrees to admit patient for further evaluation and treatment. Call ended at 4:50 PM  Plan: Admit  Final Clinical Impressions(s) / ED Diagnoses   Final diagnoses:  Atrial  fibrillation with rapid ventricular response [facility])  Hyperglycemia    ED Discharge Orders    None       Daleen Bo, MD 03/04/18 1655

## 2018-03-04 NOTE — ED Notes (Signed)
Pt informed we needed urine specimen. Unable to void at this time

## 2018-03-04 NOTE — H&P (Addendum)
History and Physical    [name] ERD:[id number] DOB: [birth year] DOA: 03/04/2018  PCP: Aletha Halim., PA-C   Patient coming from: Nursing Home  Chief Complaint: Facial swelling,   HPI: [name] is a 72 y.o. female with medical history significant for atrial fibrillation, diastolic CHF,HTN, rheumatoid arthritis, CVA, DM, who was brought to the ED with complaints of left facial swelling, and pain-involving her left eye and nose.  On EMS arrival patient was found to be tachycardic heart rate up to 190s, and hypotensive systolic in the 56D.  Patient was unaware of her rapid heart rate.  She denies chest pain or shortness of breath. Patient also reports left eye pain surrounding redness and swelling extending to her nose, 2 days duration.  Also 2 mornings ays ago she noted crustiness of her left eye from discharge.  No pain with eye movements.  No prior history of similar swelling.  No recent allergies to new meds. No fever or chills.   ED Course: Heart rate 168, blood pressure 119/74, sodium 133, glucose 432, about baseline 1.0, with normal bicarb 24, normal anion gap 12.  Portable chest x-ray no acute disease.  Patient was given 10mg  Cardizem, and started on gtt. 1.5 L bolus also given.  Hospitalist to admit for A. fib with RVR.  Review of Systems: As per HPI all other systems reviewed and negative.  Past Medical History:  Diagnosis Date  . Abnormal LFTs 07/24/2017  . Actinic keratosis   . Anemia    hx of  . Anemia of chronic renal failure, stage 3 (moderate) ([place]) 07/17/2015  . Anticoagulant causing adverse effect in therapeutic use 07/17/2015  . Anxiety   . Arthritis    rheumatoid  . Atrial fibrillation ([place])    a. persistent, she remains on Xarelto  . Carpal tunnel syndrome   . Chronic diastolic (congestive) heart failure ([place])    a. 04/2016: echo showing a preserved EF of 55-60% with mild MR. LA and RA midly dilated.   . Edema    left leg at ankle resolved now  .  Gastroesophageal reflux disease   . Hepatitis 1970   not sure what kind  . Hiatal hernia   . Hyperlipidemia   . Hypertension   . Iron deficiency anemia due to chronic blood loss 07/17/2015  . Jaundice    age 82  . Peripheral vascular disease ([place]) yrs ago   DVT left lower leg questionale told by 2 drs i had no clot, 1 md said i did  . Rheumatoid arthritis(714.0)   . Right knee pain   . Stroke ([place])   . Type II diabetes mellitus ([place])    type2    Past Surgical History:  Procedure Laterality Date  . CATARACT EXTRACTION Bilateral   . COLONOSCOPY WITH PROPOFOL N/A 02/20/2015   Procedure: COLONOSCOPY WITH PROPOFOL;  Surgeon: Carol Ada, MD;  Location: [facility];  Service: Endoscopy;  Laterality: N/A;  . ENTEROSCOPY N/A 03/23/2016   Procedure: ENTEROSCOPY;  Surgeon: Carol Ada, MD;  Location: [facility];  Service: Endoscopy;  Laterality: N/A;  . ENTEROSCOPY N/A 06/17/2016   Procedure: ENTEROSCOPY;  Surgeon: Carol Ada, MD;  Location: [facility];  Service: Endoscopy;  Laterality: N/A;  . ESOPHAGOGASTRODUODENOSCOPY (EGD) WITH PROPOFOL N/A 02/20/2015   Procedure: ESOPHAGOGASTRODUODENOSCOPY (EGD) WITH PROPOFOL;  Surgeon: Carol Ada, MD;  Location: [facility];  Service: Endoscopy;  Laterality: N/A;  . GIVENS CAPSULE STUDY N/A 03/21/2016   Procedure: GIVENS CAPSULE STUDY;  Surgeon: Saralyn Pilar  Benson Norway, MD;  Location: [facility];  Service: Endoscopy;  Laterality: N/A;  . HAND SURGERY  1995 and 1996   artificial joints both hands  . JOINT REPLACEMENT    . LUMBAR LAMINECTOMY/DECOMPRESSION MICRODISCECTOMY N/A 02/10/2016   Procedure: MICROLUMBAR DECOMPRESSION L4-L5 AND L3- L4, AND EXCISION OF SYNOVIAL CYST L4-L5;  Surgeon: Susa Day, MD;  Location: [facility];  Service: Orthopedics;  Laterality: N/A;  . TOTAL HIP ARTHROPLASTY Left 2009  . TOTAL HIP ARTHROPLASTY Right 04/03/2013   Procedure: RIGHT TOTAL HIP ARTHROPLASTY ANTERIOR APPROACH;  Surgeon: Gearlean Alf, MD;  Location: [facility];   Service: Orthopedics;  Laterality: Right;     reports that she has never smoked. She has never used smokeless tobacco. She reports that she does not drink alcohol or use drugs.  Allergies  Allergen Reactions  . Promethazine     Family History  Problem Relation Age of Onset  . Pneumonia Mother 53  . Stroke Father 67  . Heart failure Sister   . Hypertension Sister   . Heart attack Neg Hx     Prior to Admission medications   Medication Sig Start Date End Date Taking? Authorizing Provider  Abatacept (ORENCIA CLICKJECT) 952 MG/ML SOAJ Inject 125 mg into the skin every Thursday.    Yes [provider]  buPROPion (WELLBUTRIN SR) 150 MG 12 hr tablet Take 150 mg by mouth 2 (two) times daily.     Yes [provider]  Cholecalciferol (VITAMIN D3) 1000 units CAPS Take 1,000 Units by mouth every evening.    Yes [provider]  ferrous gluconate (FERGON) 324 MG tablet Take 1 tablet (324 mg total) by mouth 2 (two) times daily with a meal. 05/17/16  Yes Rizwan, Eunice Blase, MD  furosemide (LASIX) 40 MG tablet Take 40 mg by mouth daily.    Yes [provider]  gabapentin (NEURONTIN) 100 MG capsule Take 3 capsules (300 mg total) by mouth 3 (three) times daily. 10/10/17  Yes Kathie Dike, MD  glimepiride (AMARYL) 2 MG tablet TAKE 1 TABLET DAILY BEFORE BREAKFAST 07/31/17  Yes [provider]  leflunomide (ARAVA) 10 MG tablet Take 10 mg by mouth daily.   Yes [provider]  lidocaine (LIDODERM) 5 % Place 1 patch onto the skin daily. Remove & Discard patch within 12 hours or as directed by MD   Yes [provider]  metFORMIN (GLUCOPHAGE-XR) 500 MG 24 hr tablet Take 500 mg by mouth 2 (two) times daily.  07/31/17  Yes [provider]  metoprolol tartrate (LOPRESSOR) 50 MG tablet TAKE 2 TABLETS (100 MG TOTAL) BY MOUTH 2 (TWO) TIMES DAILY. 08/22/17  Yes Kilroy, Luke K, PA-C  Multiple Vitamins-Minerals (PRESERVISION AREDS 2) CAPS Take 1 capsule by  mouth daily.    Yes [provider]  nitroGLYCERIN (NITROSTAT) 0.4 MG SL tablet Place 1 tablet (0.4 mg total) under the tongue every 5 (five) minutes as needed for chest pain (x 3 doses). 11/28/16  Yes Lelon Perla, MD  omeprazole (PRILOSEC) 20 MG capsule Take 1 capsule by mouth daily. 02/12/18  Yes [provider]  oxycodone (OXY-IR) 5 MG capsule Take 5 mg by mouth every 6 (six) hours as needed for pain.   Yes [provider]  potassium chloride SA (K-DUR,KLOR-CON) 20 MEQ tablet Take 1 tablet (20 mEq total) by mouth 2 (two) times daily. 10/10/17  Yes Kathie Dike, MD  predniSONE (DELTASONE) 5 MG tablet Take 5 mg by mouth daily.   Yes [provider]  QUEtiapine (SEROQUEL) 25 MG tablet Take 1 tablet (25 mg total) by mouth 3 (three) times daily as needed (delerium or aggitation). 07/27/17  Yes Winfrey, Alcario Drought, MD  Rivaroxaban (XARELTO) 15 MG TABS tablet Take 1 tablet (15 mg total) by mouth daily. 07/28/17  Yes Winfrey, Alcario Drought, MD  sennosides-docusate sodium (SENOKOT-S) 8.6-50 MG tablet Take 1 tablet by mouth daily.   Yes [provider]  triamcinolone cream (KENALOG) 0.1 % Apply 1 application topically 2 (two) times daily.   Yes [provider]  VASCEPA 1 g CAPS Take 1 capsule by mouth every morning. 02/25/18  Yes [provider]  vitamin B-12 1000 MCG tablet Take 1 tablet (1,000 mcg total) by mouth every evening. Patient taking differently: Take 2,000 mcg by mouth every evening.  07/05/17  Yes Love, Ivan Anchors, PA-C  VOLTAREN 1 % GEL Apply 2 g topically at bedtime as needed for pain. Knees, calf 04/27/17  Yes [provider]    Physical Exam: Vitals:   03/04/18 1500 03/04/18 1530 03/04/18 1600 03/04/18 1630  BP: 110/71 131/78 (!) 130/97 124/82  Pulse: (!) 103 (!) 103 (!) 104 92  Resp: 20   (!) 36  SpO2: 91% 94% 93% 96%    Constitutional: NAD, calm, comfortable Vitals:   03/04/18 1500 03/04/18 1530 03/04/18 1600  03/04/18 1630  BP: 110/71 131/78 (!) 130/97 124/82  Pulse: (!) 103 (!) 103 (!) 104 92  Resp: 20   (!) 36  SpO2: 91% 94% 93% 96%   Eyes: PERRL, mild erythema extending from left orbit to left lateral side of left nose, mild swelling also present, no discharge noted, conjunctiva clear ENMT: Mucous membranes are moist. Posterior pharynx clear of any exudate or lesions.Normal dentition.  Neck: normal, supple, no masses, no thyromegaly Respiratory: Basilar crackles.No wheeze. Normal respiratory effort. No accessory muscle use.  Cardiovascular: Irregular rate and rhythm, no murmurs / rubs / gallops. Trace extremity edema. 2+ pedal pulses. No carotid bruits.  Abdomen: no tenderness, no masses palpated. No hepatosplenomegaly. Bowel sounds positive.  Musculoskeletal: no clubbing / cyanosis. No joint deformity upper and lower extremities. Good ROM, no contractures. Normal muscle tone.  Skin: no rashes, lesions, ulcers. No induration Neurologic: CN 2-12 grossly intact.  Normal eye range of movement. strength 5/5 in all 4.  Psychiatric: Normal judgment and insight. Alert and oriented x 3. Normal mood.   Labs on Admission: I have personally reviewed following labs and imaging studies  CBC: Recent Labs  Lab 03/04/18 1323  WBC 14.7*  NEUTROABS 12.6*  HGB 13.8  HCT 46.4*  MCV 87.1  PLT 614   Basic Metabolic Panel: Recent Labs  Lab 03/04/18 1323  NA 133*  K 5.0  CL 97*  CO2 24  GLUCOSE 432*  BUN 23  CREATININE 1.02*  CALCIUM 9.3   Liver Function Tests: Recent Labs  Lab 03/04/18 1323  AST 17  ALT 19  ALKPHOS 114  BILITOT 1.7*  PROT 7.9  ALBUMIN 3.7   Cardiac Enzymes: Recent Labs  Lab 03/04/18 1323  TROPONINI <0.03   CBG: Recent Labs  Lab 03/04/18 1302 03/04/18 1622  GLUCAP 404* 320*    Radiological Exams on Admission: Dg Chest Port 1 View  Result Date: 03/04/2018 CLINICAL DATA:  Left eye redness and swelling since yesterday. EXAM: PORTABLE CHEST 1 VIEW COMPARISON:   Single-view of the chest 10/06/2017. PA and lateral chest 07/11/2016. FINDINGS: The lungs are clear. Heart size is upper normal. No pneumothorax or pleural  fluid. No acute or focal bony abnormality. IMPRESSION: No acute disease. Electronically Signed   By: Inge Rise M.D.   On: 03/04/2018 14:10    EKG: Independently reviewed.  Atrial fibrillation heart rate- 182.  Significant change from prior.  Assessment/Plan Principal Problem:   Atrial fibrillation with RVR (HCC) Active Problems:   Hypertension   DM2 (diabetes mellitus, type 2) (HCC)   Rheumatoid arthritis (HCC)   Chronic diastolic (congestive) heart failure (HCC)  Atrial fibrillation with RVR-heart rate up to 184. ? Provoking factor- ? Facial cellulitis.  EKG no significant change from prior, trop x 1- <0.03.  Initial hypotension. Home medications metoprolol 100 BID. 1.5L given in ED. -Continue Cardizem GTT started in the ED, wean off -Resume home metoprolol  - trend Trops -TSH -Continue anticoagulation with Xarelto  Left eye pain-with Surrounding redness and swelling, normal and no increased pain with eye movements.  WBC 14.7.  Afebrile. -Stat MAxillifacial CT with contrast to rule out periorbital cellulitis- Edema in the subcutaneous tissues of the left neck and face extending to the maxilla. This is most consistent with cellulitis. No abscess identified. - Will stat Iv cefriazone 1g daily.  Diastolic CHF- trace LE edema, Clear chest xray, BNP mildy elevated from prior- 353.  Last echo 07/2017-EF 50 to 55%, unable to assess diastolic function due to A. Fib.  1.5 L given in ED - Hold off on further IV fluids - Hold home lasix for now, with initial hypotension  DVT hx, [place] Fib  - Resume xalreto.  HTN- Initial hypotension -Wean off Cardizem as able, resume home metoprolol -Hold Lasix and home K supplements  DM- Glucose 432 >> 320 with IVF .  Not in DKA.  - HGba1c  - Novolog 5u, then SSI - Cont home Glimeperide, hold  home metformin with contrast exposure  Rheumatoid osteoarthritis- stable. -Hold home abatacept leflunomide, rule out infectious etiology for eye swelling. -Continue low-dose prednisone 5mg  daily -Continue home oxycodone for neck pain   DVT prophylaxis: Evalina Field Code Status: Full Family Communication: None at bedside Disposition Plan:  Per rounding team Consults called: None Admission status: Inpt, Step down   Bethena Roys MD [facility] Pager [phone] From 3PM-11PM.  Otherwise please contact night-coverage [email or web address] Password [facility]  03/04/2018, 8:38 PM

## 2018-03-04 NOTE — ED Triage Notes (Signed)
EMS called out due to swelling left side of face. EMS report pulse rate 410-301  Systolic 31'Y Hx of afib. Pt alert and answering . CBG 404

## 2018-03-05 DIAGNOSIS — I5032 Chronic diastolic (congestive) heart failure: Secondary | ICD-10-CM

## 2018-03-05 DIAGNOSIS — I4891 Unspecified atrial fibrillation: Secondary | ICD-10-CM

## 2018-03-05 DIAGNOSIS — M05752 Rheumatoid arthritis with rheumatoid factor of left hip without organ or systems involvement: Secondary | ICD-10-CM

## 2018-03-05 DIAGNOSIS — E11319 Type 2 diabetes mellitus with unspecified diabetic retinopathy without macular edema: Secondary | ICD-10-CM

## 2018-03-05 DIAGNOSIS — L03211 Cellulitis of face: Secondary | ICD-10-CM

## 2018-03-05 DIAGNOSIS — I10 Essential (primary) hypertension: Secondary | ICD-10-CM

## 2018-03-05 LAB — CBC
HCT: 42.7 % (ref 36.0–46.0)
Hemoglobin: 12.6 g/dL (ref 12.0–15.0)
MCH: 26.1 pg (ref 26.0–34.0)
MCHC: 29.5 g/dL — ABNORMAL LOW (ref 30.0–36.0)
MCV: 88.4 fL (ref 80.0–100.0)
NRBC: 0 % (ref 0.0–0.2)
PLATELETS: 210 10*3/uL (ref 150–400)
RBC: 4.83 MIL/uL (ref 3.87–5.11)
RDW: 17.1 % — ABNORMAL HIGH (ref 11.5–15.5)
WBC: 12.3 10*3/uL — ABNORMAL HIGH (ref 4.0–10.5)

## 2018-03-05 LAB — GLUCOSE, CAPILLARY
Glucose-Capillary: 214 mg/dL — ABNORMAL HIGH (ref 70–99)
Glucose-Capillary: 244 mg/dL — ABNORMAL HIGH (ref 70–99)
Glucose-Capillary: 262 mg/dL — ABNORMAL HIGH (ref 70–99)
Glucose-Capillary: 239 mg/dL — ABNORMAL HIGH (ref 70–99)

## 2018-03-05 LAB — BASIC METABOLIC PANEL
Anion gap: 9 (ref 5–15)
BUN: 22 mg/dL (ref 8–23)
CALCIUM: 9 mg/dL (ref 8.9–10.3)
CO2: 27 mmol/L (ref 22–32)
CREATININE: 0.78 mg/dL (ref 0.44–1.00)
Chloride: 103 mmol/L (ref 98–111)
GFR calc Af Amer: >60 mL/min (ref >60)
GFR calc non Af Amer: >60 mL/min (ref >60)
Glucose, Bld: 288 mg/dL — ABNORMAL HIGH (ref 70–99)
Potassium: 4.4 mmol/L (ref 3.5–5.1)
Sodium: 139 mmol/L (ref 135–145)

## 2018-03-05 LAB — TSH: TSH: 1.244 u[IU]/mL (ref 0.350–4.500)

## 2018-03-05 LAB — MRSA PCR SCREENING: MRSA by PCR: POSITIVE — AB

## 2018-03-05 LAB — TROPONIN I

## 2018-03-05 LAB — HEMOGLOBIN A1C
Hgb A1c MFr Bld: 10.6 % — ABNORMAL HIGH (ref 4.8–5.6)
Mean Plasma Glucose: 257.52 mg/dL

## 2018-03-05 MED ORDER — METOPROLOL TARTRATE 5 MG/5ML IV SOLN
5.0000 mg | INTRAVENOUS | Status: DC | PRN
  Administered 2018-03-05: 5 mg via INTRAVENOUS
  Filled 2018-03-05: qty 5

## 2018-03-05 MED ORDER — METOPROLOL TARTRATE 50 MG PO TABS
100.0000 mg | ORAL_TABLET | Freq: Three times a day (TID) | ORAL | Status: DC
  Administered 2018-03-05 – 2018-03-06 (×4): 100 mg via ORAL
  Filled 2018-03-05: qty 4
  Filled 2018-03-05 (×2): qty 2
  Filled 2018-03-05: qty 4

## 2018-03-05 MED ORDER — FLUTICASONE PROPIONATE 50 MCG/ACT NA SUSP
1.0000 | Freq: Every day | NASAL | Status: DC
  Administered 2018-03-05 – 2018-03-06 (×2): 1 via NASAL
  Filled 2018-03-05: qty 16

## 2018-03-05 MED ORDER — CHLORHEXIDINE GLUCONATE CLOTH 2 % EX PADS: 6.0000 | MEDICATED_PAD | Freq: Every day | CUTANEOUS | Status: DC

## 2018-03-05 MED ORDER — MORPHINE SULFATE (PF) 4 MG/ML IV SOLN
3.0000 mg | Freq: Once | INTRAVENOUS | Status: AC
  Administered 2018-03-05: 3 mg via INTRAVENOUS
  Filled 2018-03-05: qty 1

## 2018-03-05 MED ORDER — MUPIROCIN 2 % EX OINT
1.0000 "application " | TOPICAL_OINTMENT | Freq: Two times a day (BID) | CUTANEOUS | Status: DC
  Administered 2018-03-05 – 2018-03-06 (×2): 1 via NASAL
  Filled 2018-03-05: qty 22

## 2018-03-05 NOTE — Progress Notes (Signed)
PROGRESS NOTE    [name]  XBM:[id number] DOB: 1945/05/25 DOA: 03/04/2018 PCP: Aletha Halim., PA-C     Brief Narrative:  72 y/o with A. Fib (chronically on xarelto), HTN, type 2 diabetes and RA; presented with acute left facial cellulitis and a. Fib with RVR.  Assessment & Plan: 1-Atrial fibrillation with RVR ([place]) -improved and better controlled now after cardizem. -will wean off cardizem -continue monitoring on telemetry -metoprolol dose adjusted -no CP or SOB. -continue xarelto  2-left facial cellulitis -no affectation of her orbits appreciated on CT scan; there was not signs of abscess either. -patient afebrile, WBC's trending down -continue current antibiotics and apply cold compresses  3-Hypertension -BP is stable and well controlled -will monitor VS  4-DM2 (diabetes mellitus, type 2) (HCC) -continue SSI and current hypoglycemic regimen -patient CBG's stable -modified carb diet ordered.  5-Rheumatoid arthritis ([place]) -continue low dose prednisone  -continue holding immunosuppressants while actively treating infection.  6-Chronic diastolic (congestive) heart failure (HCC) -compensated -will follow daily weights and strict I's and O's  7-rhinitis -will start patient on flonase  8-GERD -continue PPI  9-depression/anxiety -continue seroquel and bupropion  DVT prophylaxis: xarelto Code Status: Full Family Communication: no family at bedside  Disposition Plan: wean off Cardizem; continue adjusted dose of metoprolol for rate control; continue antibiotics and follow clinical response; possible transfer to telemetry later today.  Consultants:   None   Procedures:   See below for x-ray reports  Antimicrobials:  Anti-infectives (From admission, onward)   Start     Dose/Rate Route Frequency Ordered Stop   03/04/18 2045  cefTRIAXone (ROCEPHIN) 1 g in sodium chloride 0.9 % 100 mL IVPB     1 g 200 mL/hr over 30 Minutes Intravenous Every 24 hours  03/04/18 2037         Subjective: Afebrile, no CP, no palpitations, no SOB. Patient still with facial swelling, redness and pain (even improved). Reported symptoms of nasal congestion.  Objective: Vitals:   03/05/18 0415 03/05/18 0430 03/05/18 0445 03/05/18 0453  BP: (!) 152/111 (!) 175/111 (!) 154/101   Pulse: (!) 106 60 (!) 54   Resp: 18 17 18    Temp:      TempSrc:      SpO2: 94% 93% 95%   Weight:    88.2 kg  Height:        Intake/Output Summary (Last 24 hours) at 03/05/2018 0919 Last data filed at 03/05/2018 0653 Gross per 24 hour  Intake 2862.82 ml  Output 500 ml  Net 2362.82 ml   Filed Weights   03/04/18 1854 03/05/18 0453  Weight: 88.8 kg 88.2 kg    Examination: General exam: Alert, awake, oriented x 3; left side facial swelling improved and less erythematous; still tender on palpation. No CP, no SOB, no palpitations. Respiratory system: Clear to auscultation. Respiratory effort normal. Cardiovascular system: Rate much better controlled (reaching 110). No murmurs, rubs, gallops or JVD. Gastrointestinal system: Abdomen is nondistended, soft and nontender. No organomegaly or masses felt. Normal bowel sounds heard. Central nervous system: Alert and oriented. No focal neurological deficits. Extremities: No Cyanosis, no clubbing. Patient with joint deformities in her hands from chronic RA. Skin: left facial erythema, swelling and tenderness on palpation; (positive induration, but no fluctuation on exam.  Psychiatry: Judgement and insight appear normal. Mood & affect appropriate.    Data Reviewed: I have personally reviewed following labs and imaging studies  CBC: Recent Labs  Lab 03/04/18 1323 03/05/18 0204  WBC 14.7* 12.3*  NEUTROABS 12.6*  --   HGB 13.8 12.6  HCT 46.4* 42.7  MCV 87.1 88.4  PLT 236 951   Basic Metabolic Panel: Recent Labs  Lab 03/04/18 1323 03/05/18 0204  NA 133* 139  K 5.0 4.4  CL 97* 103  CO2 24 27  GLUCOSE 432* 288*  BUN 23 22    CREATININE 1.02* 0.78  CALCIUM 9.3 9.0   GFR: Estimated Creatinine Clearance: 71.4 mL/min (by C-G formula based on SCr of 0.78 mg/dL).   Liver Function Tests: Recent Labs  Lab 03/04/18 1323  AST 17  ALT 19  ALKPHOS 114  BILITOT 1.7*  PROT 7.9  ALBUMIN 3.7   Cardiac Enzymes: Recent Labs  Lab 03/04/18 1323 03/04/18 1930 03/05/18 0204  TROPONINI <0.03 0.03* <0.03   CBG: Recent Labs  Lab 03/04/18 1302 03/04/18 1622 03/04/18 2057 03/05/18 0730  GLUCAP 404* 320* 274* 214*   Thyroid Function Tests: Recent Labs    03/04/18 1323  TSH 1.244   Urine analysis:    Component Value Date/Time   COLORURINE YELLOW 10/04/2017 1140   APPEARANCEUR HAZY (A) 10/04/2017 1140   LABSPEC 1.025 10/04/2017 1140   PHURINE 5.0 10/04/2017 1140   GLUCOSEU NEGATIVE 10/04/2017 1140   HGBUR TRACE (A) 10/04/2017 1140   BILIRUBINUR NEGATIVE 10/04/2017 1140   KETONESUR NEGATIVE 10/04/2017 1140   PROTEINUR 30 (A) 10/04/2017 1140   UROBILINOGEN 1.0 10/09/2014 0040   NITRITE POSITIVE (A) 10/04/2017 1140   LEUKOCYTESUR SMALL (A) 10/04/2017 1140    Recent Results (from the past 240 hour(s))  MRSA PCR Screening     Status: Abnormal   Collection Time: 03/04/18  6:08 PM  Result Value Ref Range Status   MRSA by PCR POSITIVE (A) NEGATIVE Final    Comment:        The GeneXpert MRSA Assay (FDA approved for NASAL specimens only), is one component of a comprehensive MRSA colonization surveillance program. It is not intended to diagnose MRSA infection nor to guide or monitor treatment for MRSA infections. RESULT CALLED TO, READ BACK BY AND VERIFIED WITH: HARRIS,B @ 0241 ON 03/05/18 BY JUW Performed at [facility], [address] [place],  [address]      Radiology Studies: Ct Maxillofacial W Contrast  Result Date: 03/04/2018 CLINICAL DATA:  Left facial swelling.  Diabetic. EXAM: CT MAXILLOFACIAL WITH CONTRAST TECHNIQUE: Multidetector CT imaging of the maxillofacial structures was  performed with intravenous contrast. Multiplanar CT image reconstructions were also generated. CONTRAST:  65mL ISOVUE-300 IOPAMIDOL (ISOVUE-300) INJECTION 61% COMPARISON:  CT head 07/24/2017 FINDINGS: Osseous: Negative for fracture.  No skeletal mass or destruction. Moderate to advanced degenerative change in the TMJ bilaterally. Cervical spondylosis. Orbits: Bilateral cataract surgery. No orbital mass or abscess. Extraocular muscles and optic nerve normal bilaterally. Sinuses: Mild mucosal edema maxillary sinus bilaterally. No air-fluid level. Mastoid sinus clear bilaterally. Soft tissues: Edema in the subcutaneous tissues of the left neck and face with mild skin thickening. No abscess in the soft tissues. No focal mass lesion. Limited intracranial: No acute intracranial abnormality. IMPRESSION: Edema in the subcutaneous tissues of the left neck and face extending to the maxilla. This is most consistent with cellulitis. No abscess identified. Electronically Signed   By: Franchot Gallo M.D.   On: 03/04/2018 19:01   Dg Chest Port 1 View  Result Date: 03/04/2018 CLINICAL DATA:  Left eye redness and swelling since yesterday. EXAM: PORTABLE CHEST 1 VIEW COMPARISON:  Single-view of the chest 10/06/2017. PA and lateral chest 07/11/2016. FINDINGS: The  lungs are clear. Heart size is upper normal. No pneumothorax or pleural fluid. No acute or focal bony abnormality. IMPRESSION: No acute disease. Electronically Signed   By: Inge Rise M.D.   On: 03/04/2018 14:10    Scheduled Meds: . buPROPion  150 mg Oral BID  . fluticasone  1 spray Each Nare Daily  . gabapentin  300 mg Oral TID  . glimepiride  2 mg Oral QAC breakfast  . insulin aspart  0-9 Units Subcutaneous TID WC  . metoprolol tartrate  100 mg Oral TID  . nystatin   Topical TID  . pantoprazole  40 mg Oral Daily  . predniSONE  5 mg Oral Daily  . Rivaroxaban  15 mg Oral Daily   Continuous Infusions: . sodium chloride 10 mL/hr at 03/05/18 0653  .  cefTRIAXone (ROCEPHIN)  IV Stopped (03/04/18 2241)     LOS: 1 day    Time spent: 30 minutes   Barton Dubois, MD [facility] Pager [phone]  If 7PM-7AM, please contact night-coverage [email or web address] Password [facility] 03/05/2018, 9:19 AM

## 2018-03-06 DIAGNOSIS — R739 Hyperglycemia, unspecified: Secondary | ICD-10-CM

## 2018-03-06 LAB — BASIC METABOLIC PANEL
Anion gap: 9 (ref 5–15)
BUN: 18 mg/dL (ref 8–23)
CO2: 25 mmol/L (ref 22–32)
Calcium: 8.8 mg/dL — ABNORMAL LOW (ref 8.9–10.3)
Chloride: 102 mmol/L (ref 98–111)
Creatinine, Ser: 0.69 mg/dL (ref 0.44–1.00)
GFR calc Af Amer: >60 mL/min (ref >60)
Glucose, Bld: 123 mg/dL — ABNORMAL HIGH (ref 70–99)
POTASSIUM: 3.9 mmol/L (ref 3.5–5.1)
Sodium: 136 mmol/L (ref 135–145)

## 2018-03-06 LAB — CBC
HCT: 41.8 % (ref 36.0–46.0)
Hemoglobin: 12.5 g/dL (ref 12.0–15.0)
MCH: 25.9 pg — ABNORMAL LOW (ref 26.0–34.0)
MCHC: 29.9 g/dL — ABNORMAL LOW (ref 30.0–36.0)
MCV: 86.5 fL (ref 80.0–100.0)
Platelets: 202 10*3/uL (ref 150–400)
RBC: 4.83 MIL/uL (ref 3.87–5.11)
RDW: 17 % — ABNORMAL HIGH (ref 11.5–15.5)
WBC: 12.9 10*3/uL — ABNORMAL HIGH (ref 4.0–10.5)
nRBC: 0 % (ref 0.0–0.2)

## 2018-03-06 LAB — GLUCOSE, CAPILLARY
Glucose-Capillary: 121 mg/dL — ABNORMAL HIGH (ref 70–99)
Glucose-Capillary: 173 mg/dL — ABNORMAL HIGH (ref 70–99)

## 2018-03-06 MED ORDER — METOPROLOL TARTRATE 100 MG PO TABS: 100.0000 mg | ORAL_TABLET | Freq: Three times a day (TID) | ORAL | Status: DC

## 2018-03-06 MED ORDER — ABATACEPT 125 MG/ML ~~LOC~~ SOAJ: 125.0000 mg | SUBCUTANEOUS | Status: DC

## 2018-03-06 MED ORDER — SULFAMETHOXAZOLE-TRIMETHOPRIM 800-160 MG PO TABS: 1.0000 | ORAL_TABLET | Freq: Two times a day (BID) | ORAL | 0 refills | Status: AC

## 2018-03-06 MED ORDER — OXYCODONE HCL 5 MG PO CAPS: 5.0000 mg | ORAL_CAPSULE | Freq: Four times a day (QID) | ORAL | 0 refills | Status: DC | PRN

## 2018-03-06 NOTE — Discharge Summary (Signed)
Physician Discharge Summary  [name] JXB:[id number] DOB: [birth year] DOA: 03/04/2018  PCP: Aletha Halim., PA-C  Admit date: 03/04/2018 Discharge date: 03/06/2018  Time spent: 35 minutes  Recommendations for Outpatient Follow-up:  Repeat CBC to follow complete resolution of elevation in her WBCs Reassess blood pressure and further adjust antihypertensive regimen as needed Check for complete resolution of patient's left facial cellulitis.  Discharge Diagnoses:  Principal Problem:   Atrial fibrillation with RVR ([place]) Active Problems:   Hypertension   DM2 (diabetes mellitus, type 2) (HCC)   Rheumatoid arthritis (HCC)   Chronic diastolic (congestive) heart failure (HCC)   Hyperglycemia   Discharge Condition: Stable and improved.  Patient discharged with instruction to follow modified carbohydrate low-sodium diet.  She will complete antibiotics by mouth and will return to her skilled nursing facility for further care and rehabilitation.  Instructed to follow-up with PCP in 10 days.  Filed Weights   03/04/18 1854 03/05/18 0453  Weight: 88.8 kg 88.2 kg    History of present illness:  As per H&P written by Dr. Denton Brick on 03/04/2018 72 y.o. female with medical history significant for atrial fibrillation, diastolic CHF,HTN, rheumatoid arthritis, CVA, DM, who was brought to the ED with complaints of left facial swelling, and pain-involving her left eye and nose.  On EMS arrival patient was found to be tachycardic heart rate up to 190s, and hypotensive systolic in the 13Y.  Patient was unaware of her rapid heart rate.  She denies chest pain or shortness of breath. Patient also reports left eye pain surrounding redness and swelling extending to her nose, 2 days duration.  Also 2 mornings ays ago she noted crustiness of her left eye from discharge.  No pain with eye movements.  No prior history of similar swelling.  No recent allergies to new meds. No fever or chills.   ED Course:  Heart rate 168, blood pressure 119/74, sodium 133, glucose 432, about baseline 1.0, with normal bicarb 24, normal anion gap 12.  Portable chest x-ray no acute disease.  Patient was given 10mg  Cardizem, and started on gtt. 1.5 L bolus also given.  Hospitalist to admit for A. fib with RVR.  Hospital Course:  1-Atrial fibrillation with RVR ([place]) -improved and better controlled now. -Patient weaned off cardizem -metoprolol dose adjusted -no CP or SOB. -continue xarelto -Continue with a schedule outpatient follow-up with her cardiologist to further adjust rate controlling agents as needed  2-left facial cellulitis -no affectation of her orbit appreciated on CT scan; there was not signs of abscess either. -Patient is afebrile and WBCs close to normal range -She reports no ocular tenderness -Patient will be discharged on Bactrim twice a day to complete 10 days regimen. -Advised to care for self well-hydrated and to apply cold compresses -Repeat CBC at follow-up visit to reassess complete resolution of elevation of WBCs.  3-Hypertension -BP is stable and well controlled -Continue home antihypertensive regimen -Heart healthy diet has been instructed.  4-DM2 (diabetes mellitus, type 2) (HCC) -Continue modified carbohydrate diet Resume home hypoglycemic agents Outpatient follow-up with PCP to further adjust diabetes medications as needed.  5-Rheumatoid arthritis ([place]) -continue low dose prednisone  -Resume home immunosuppressant agents as a schedule.  6-Chronic diastolic (congestive) heart failure (HCC) -compensated -Continue to follow daily weights and low-sodium diet -We will resume home medication regimen at discharge (including diuretics).  7-rhinitis -Improved with the use of Flonase. -Patient denies nasal congestion at time of discharge.  8-GERD -continue PPI  9-depression/anxiety -  continue seroquel and bupropion  Procedures:  See below for x-ray  reports.  Consultations:  Dr. Domenic Polite curbside and recommendations given to adjust B-blocker dose.  Discharge Instructions  Examination: General exam: Alert, awake, oriented x 3; left side facial swelling improved and and also reporting improvement in tenderness and induration.  Patient denies chest pain, palpitations, shortness of breath, nausea, vomiting or any other complaints.   Respiratory system: Clear to auscultation. Respiratory effort normal. Cardiovascular system: Rate much better controlled (reaching 106; but mainly in 80's to 90's). No murmurs, rubs, gallops or JVD. Gastrointestinal system: Abdomen is nondistended, soft and nontender. No organomegaly or masses felt. Normal bowel sounds heard. Central nervous system: Alert and oriented. No focal neurological deficits. Extremities: No Cyanosis, no clubbing. Patient with joint deformities in her hands from chronic RA. Skin: left facial erythema, improved swelling, pain and induration. No drainage or fluctuation. Denies pain in her eyes with ocular motion.  Psychiatry: Judgement and insight appear normal. Mood & affect appropriate  Discharge Instructions    Diet - low sodium heart healthy   Complete by:  As directed    Diet Carb Modified   Complete by:  As directed    Discharge instructions   Complete by:  As directed    Continue adequate hydration Take medication as prescribed Continue to apply cool compresses to affected area up to 3 times a day Arrange follow-up with PCP in 10 days.   Increase activity slowly   Complete by:  As directed      Allergies as of 03/06/2018      Reactions   Promethazine       Medication List    TAKE these medications   Abatacept 125 MG/ML Soaj Inject 125 mg into the skin every Thursday. Start taking on:  03/15/2018 What changed:  These instructions start on 03/15/2018. If you are unsure what to do until then, ask your doctor or other care provider.   buPROPion 150 MG 12 hr  tablet Commonly known as:  WELLBUTRIN SR Take 150 mg by mouth 2 (two) times daily.   cyanocobalamin 1000 MCG tablet Take 1 tablet (1,000 mcg total) by mouth every evening. What changed:  how much to take   ferrous gluconate 324 MG tablet Commonly known as:  FERGON Take 1 tablet (324 mg total) by mouth 2 (two) times daily with a meal.   furosemide 40 MG tablet Commonly known as:  LASIX Take 40 mg by mouth daily.   gabapentin 100 MG capsule Commonly known as:  NEURONTIN Take 3 capsules (300 mg total) by mouth 3 (three) times daily.   glimepiride 2 MG tablet Commonly known as:  AMARYL TAKE 1 TABLET DAILY BEFORE BREAKFAST   leflunomide 10 MG tablet Commonly known as:  ARAVA Take 10 mg by mouth daily.   lidocaine 5 % Commonly known as:  LIDODERM Place 1 patch onto the skin daily. Remove & Discard patch within 12 hours or as directed by MD   metFORMIN 500 MG 24 hr tablet Commonly known as:  GLUCOPHAGE-XR Take 500 mg by mouth 2 (two) times daily.   metoprolol tartrate 100 MG tablet Commonly known as:  LOPRESSOR Take 1 tablet (100 mg total) by mouth 3 (three) times daily. What changed:    medication strength  when to take this   nitroGLYCERIN 0.4 MG SL tablet Commonly known as:  NITROSTAT Place 1 tablet (0.4 mg total) under the tongue every 5 (five) minutes as needed for chest pain (x 3  doses).   omeprazole 20 MG capsule Commonly known as:  PRILOSEC Take 1 capsule by mouth daily.   oxycodone 5 MG capsule Commonly known as:  OXY-IR Take 1 capsule (5 mg total) by mouth every 6 (six) hours as needed (severe pain). What changed:  reasons to take this   potassium chloride SA 20 MEQ tablet Commonly known as:  K-DUR,KLOR-CON Take 1 tablet (20 mEq total) by mouth 2 (two) times daily.   predniSONE 5 MG tablet Commonly known as:  DELTASONE Take 5 mg by mouth daily.   PRESERVISION AREDS 2 Caps Take 1 capsule by mouth daily.   QUEtiapine 25 MG tablet Commonly known  as:  SEROQUEL Take 1 tablet (25 mg total) by mouth 3 (three) times daily as needed (delerium or aggitation).   Rivaroxaban 15 MG Tabs tablet Commonly known as:  XARELTO Take 1 tablet (15 mg total) by mouth daily.   sennosides-docusate sodium 8.6-50 MG tablet Commonly known as:  SENOKOT-S Take 1 tablet by mouth daily.   sulfamethoxazole-trimethoprim 800-160 MG tablet Commonly known as:  BACTRIM DS,SEPTRA DS Take 1 tablet by mouth 2 (two) times daily for 10 days.   triamcinolone cream 0.1 % Commonly known as:  KENALOG Apply 1 application topically 2 (two) times daily.   VASCEPA 1 g Caps Generic drug:  Icosapent Ethyl Take 1 capsule by mouth every morning.   Vitamin D3 25 MCG (1000 UT) Caps Take 1,000 Units by mouth every evening.   VOLTAREN 1 % Gel Generic drug:  diclofenac sodium Apply 2 g topically at bedtime as needed for pain. Knees, calf      Allergies  Allergen Reactions  . Promethazine    Follow-up Information    Aletha Halim., PA-C. Schedule an appointment as soon as possible for a visit in 10 day(s).   Specialty:  Family Medicine Contact information: [address] [place] [address] [phone]        Lelon Perla, MD .   Specialty:  Cardiology Contact information: [address] [place] [address] [phone]          The results of significant diagnostics from this hospitalization (including imaging, microbiology, ancillary and laboratory) are listed below for reference.    Significant Diagnostic Studies: Ct Maxillofacial W Contrast  Result Date: 03/04/2018 CLINICAL DATA:  Left facial swelling.  Diabetic. EXAM: CT MAXILLOFACIAL WITH CONTRAST TECHNIQUE: Multidetector CT imaging of the maxillofacial structures was performed with intravenous contrast. Multiplanar CT image reconstructions were also generated. CONTRAST:  64mL ISOVUE-300 IOPAMIDOL (ISOVUE-300) INJECTION 61% COMPARISON:  CT head 07/24/2017 FINDINGS:  Osseous: Negative for fracture.  No skeletal mass or destruction. Moderate to advanced degenerative change in the TMJ bilaterally. Cervical spondylosis. Orbits: Bilateral cataract surgery. No orbital mass or abscess. Extraocular muscles and optic nerve normal bilaterally. Sinuses: Mild mucosal edema maxillary sinus bilaterally. No air-fluid level. Mastoid sinus clear bilaterally. Soft tissues: Edema in the subcutaneous tissues of the left neck and face with mild skin thickening. No abscess in the soft tissues. No focal mass lesion. Limited intracranial: No acute intracranial abnormality. IMPRESSION: Edema in the subcutaneous tissues of the left neck and face extending to the maxilla. This is most consistent with cellulitis. No abscess identified. Electronically Signed   By: Franchot Gallo M.D.   On: 03/04/2018 19:01   Dg Chest Port 1 View  Result Date: 03/04/2018 CLINICAL DATA:  Left eye redness and swelling since yesterday. EXAM: PORTABLE CHEST 1 VIEW COMPARISON:  Single-view of the  chest 10/06/2017. PA and lateral chest 07/11/2016. FINDINGS: The lungs are clear. Heart size is upper normal. No pneumothorax or pleural fluid. No acute or focal bony abnormality. IMPRESSION: No acute disease. Electronically Signed   By: Inge Rise M.D.   On: 03/04/2018 14:10    Microbiology: Recent Results (from the past 240 hour(s))  MRSA PCR Screening     Status: Abnormal   Collection Time: 03/04/18  6:08 PM  Result Value Ref Range Status   MRSA by PCR POSITIVE (A) NEGATIVE Final    Comment:        The GeneXpert MRSA Assay (FDA approved for NASAL specimens only), is one component of a comprehensive MRSA colonization surveillance program. It is not intended to diagnose MRSA infection nor to guide or monitor treatment for MRSA infections. RESULT CALLED TO, READ BACK BY AND VERIFIED WITH: HARRIS,B @ 0241 ON 03/05/18 BY JUW Performed at [facility], [address], [place], [place] [address]       Labs: Basic Metabolic Panel: Recent Labs  Lab 03/04/18 1323 03/05/18 0204 03/06/18 0349  NA 133* 139 136  K 5.0 4.4 3.9  CL 97* 103 102  CO2 24 27 25   GLUCOSE 432* 288* 123*  BUN 23 22 18   CREATININE 1.02* 0.78 0.69  CALCIUM 9.3 9.0 8.8*   Liver Function Tests: Recent Labs  Lab 03/04/18 1323  AST 17  ALT 19  ALKPHOS 114  BILITOT 1.7*  PROT 7.9  ALBUMIN 3.7   CBC: Recent Labs  Lab 03/04/18 1323 03/05/18 0204 03/06/18 0349  WBC 14.7* 12.3* 12.9*  NEUTROABS 12.6*  --   --   HGB 13.8 12.6 12.5  HCT 46.4* 42.7 41.8  MCV 87.1 88.4 86.5  PLT 236 210 202   Cardiac Enzymes: Recent Labs  Lab 03/04/18 1323 03/04/18 1930 03/05/18 0204  TROPONINI <0.03 0.03* <0.03   BNP: BNP (last 3 results) Recent Labs    07/24/17 1007 10/04/17 1045 03/04/18 1323  BNP 670.9* 216.0* 353.0*   CBG: Recent Labs  Lab 03/05/18 1127 03/05/18 1639 03/05/18 2057 03/06/18 0725 03/06/18 1123  GLUCAP 244* 262* 239* 121* 173*   Signed:  Barton Dubois  [facility] Pager: [phone] 03/06/2018, 2:17 PM

## 2018-03-06 NOTE — NC FL2 (Deleted)
[place] MEDICAID FL2 LEVEL OF CARE SCREENING TOOL     IDENTIFICATION  Patient Name: [name] Birthdate: [birth year] Sex: female Admission Date (Current Location): 03/04/2018  [facility] and [place] Number:  [employer] and Address:  [place] [address] [place]      Provider Number: [id number]  Attending Physician Name and Address:  Barton Dubois, MD  Relative Name and Phone Number:       Current Level of Care: Hospital Recommended Level of Care: [place] Prior Approval Number:    Date Approved/Denied:   PASRR Number:    Discharge Plan: SNF    Current Diagnoses: Patient Active Problem List   Diagnosis Date Noted  . Sepsis ([place]) 10/04/2017  . HCAP (healthcare-associated pneumonia) 10/04/2017  . Acute lower UTI 10/04/2017  . Hallucinations 07/25/2017  . Abnormal LFTs 07/24/2017  . Acute on chronic diastolic CHF (congestive heart failure) ([place])   . Elevated LFTs   . AKI (acute kidney injury) ([place])   . Elevated troponin   . Stroke, acute, embolic ([place]) 81/82/9937  . Mitral regurgitation 06/17/2017  . Acute CVA (cerebrovascular accident) ([place])   . Thyroid nodule 06/15/2017  . Atrial fibrillation with RVR ([place]) 06/14/2017  . CVA (cerebral vascular accident) ([place]) 06/14/2017  . Type II diabetes mellitus ([place])   . Peripheral vascular disease ([place])   . Hyperlipidemia   . Hiatal hernia   . Edema   . Chronic diastolic (congestive) heart failure ([place])   . Carpal tunnel syndrome   . Atrial fibrillation ([place])   . Anemia   . Actinic keratosis   . Intractable nausea and vomiting 05/31/2016  . Nausea & vomiting 05/30/2016  . Abnormal chest x-ray 05/30/2016  . Diabetes mellitus with complication ([place])   . Influenza A 05/21/2016  . Chronic atrial fibrillation 05/20/2016  . Acute CHF (congestive heart failure) ([place]) 05/20/2016  . History of GI bleed 03/29/2016  . Chronic anticoagulation 03/29/2016  .  Symptomatic anemia 03/19/2016  . Spinal stenosis of lumbar region 02/10/2016  . Varicose veins of left leg with edema 09/15/2015  . Preop cardiovascular exam 08/25/2015  . Iron deficiency anemia due to chronic blood loss 07/17/2015  . Anemia of chronic renal failure, stage 3 (moderate) ([place]) 07/17/2015  . Anticoagulant causing adverse effect in therapeutic use 07/17/2015  . Symptomatic bradycardia 02/21/2015  . Bradycardia   . Acute deep vein thrombosis (DVT) of left lower extremity ([place]) 05/06/2014  . Abdominal wall mass of right lower quadrant 07/08/2013  . OA (osteoarthritis) of hip 04/03/2013  . Osteoarthritis of hip 01/18/2013  . Night sweats 09/06/2012  . Chest pain   . Anxiety and depression   . Gastroesophageal reflux disease   . Hepatitis   . Jaundice   . Hypertension 07/01/2010  . Hypercholesterolemia 07/01/2010  . DM2 (diabetes mellitus, type 2) ([place]) 07/01/2010  . Rheumatoid arthritis ([place]) 07/01/2010  . Sinus tachycardia 07/01/2010  . Chest pain, atypical 07/01/2010    Orientation RESPIRATION BLADDER Height & Weight     Self, Place, Situation  Normal Continent Weight: 194 lb 7.1 oz (88.2 kg) Height:  5\' 7"  (170.2 cm)  BEHAVIORAL SYMPTOMS/MOOD NEUROLOGICAL BOWEL NUTRITION STATUS      Continent Diet(see dc summary)  AMBULATORY STATUS COMMUNICATION OF NEEDS Skin   Limited Assist Verbally Normal                       Personal Care Assistance Level of  Assistance  Bathing, Feeding, Dressing Bathing Assistance: Limited assistance Feeding assistance: Limited assistance Dressing Assistance: Limited assistance     Functional Limitations Info  Sight, Hearing, Speech Sight Info: Adequate Hearing Info: Adequate Speech Info: Adequate    SPECIAL CARE FACTORS FREQUENCY                       Contractures Contractures Info: Not present    Additional Factors Info  Code Status, Allergies, Psychotropic Code Status Info: Full Allergies Info:  Promethazine Psychotropic Info: Welbutrin, Seroquel         Current Medications (03/06/2018):  This is the current hospital active medication list Current Facility-Administered Medications  Medication Dose Route Frequency Provider Last Rate Last Dose  . 0.9 %  sodium chloride infusion   Intravenous PRN Roxan Hockey, MD 10 mL/hr at 03/06/18 0658    . acetaminophen (TYLENOL) tablet 650 mg  650 mg Oral Q6H PRN Emokpae, Ejiroghene E, MD       Or  . acetaminophen (TYLENOL) suppository 650 mg  650 mg Rectal Q6H PRN Emokpae, Ejiroghene E, MD      . buPROPion (WELLBUTRIN SR) 12 hr tablet 150 mg  150 mg Oral BID Emokpae, Ejiroghene E, MD   150 mg at 03/06/18 0912  . cefTRIAXone (ROCEPHIN) 1 g in sodium chloride 0.9 % 100 mL IVPB  1 g Intravenous Q24H Emokpae, Ejiroghene E, MD   Stopped at 03/05/18 2128  . Chlorhexidine Gluconate Cloth 2 % PADS 6 each  6 each Topical Q0600 Barton Dubois, MD      . fluticasone Asencion Islam) 50 MCG/ACT nasal spray 1 spray  1 spray Each Nare Daily Barton Dubois, MD   1 spray at 03/06/18 0911  . gabapentin (NEURONTIN) capsule 300 mg  300 mg Oral TID Emokpae, Ejiroghene E, MD   300 mg at 03/06/18 0913  . glimepiride (AMARYL) tablet 2 mg  2 mg Oral QAC breakfast Emokpae, Ejiroghene E, MD   2 mg at 03/06/18 0735  . insulin aspart (novoLOG) injection 0-9 Units  0-9 Units Subcutaneous TID WC Emokpae, Ejiroghene E, MD   2 Units at 03/06/18 1136  . metoprolol tartrate (LOPRESSOR) injection 5 mg  5 mg Intravenous Q1H PRN Opyd, Ilene Qua, MD   5 mg at 03/05/18 0531  . metoprolol tartrate (LOPRESSOR) tablet 100 mg  100 mg Oral TID Barton Dubois, MD   100 mg at 03/06/18 0914  . mupirocin ointment (BACTROBAN) 2 % 1 application  1 application Nasal BID Barton Dubois, MD   1 application at 58/52/77 0911  . nystatin (MYCOSTATIN/NYSTOP) topical powder   Topical TID Bodenheimer, Charles A, NP      . ondansetron (ZOFRAN) tablet 4 mg  4 mg Oral Q6H PRN Emokpae, Ejiroghene E, MD       Or   . ondansetron (ZOFRAN) injection 4 mg  4 mg Intravenous Q6H PRN Emokpae, Ejiroghene E, MD      . oxyCODONE (Oxy IR/ROXICODONE) immediate release tablet 5 mg  5 mg Oral Q6H PRN Emokpae, Ejiroghene E, MD   5 mg at 03/06/18 0306  . pantoprazole (PROTONIX) EC tablet 40 mg  40 mg Oral Daily Emokpae, Ejiroghene E, MD   40 mg at 03/06/18 0912  . polyethylene glycol (MIRALAX / GLYCOLAX) packet 17 g  17 g Oral Daily PRN Emokpae, Ejiroghene E, MD      . predniSONE (DELTASONE) tablet 5 mg  5 mg Oral Daily Emokpae, Ejiroghene E, MD   5 mg  at 03/06/18 0913  . QUEtiapine (SEROQUEL) tablet 25 mg  25 mg Oral TID PRN Emokpae, Ejiroghene E, MD      . Rivaroxaban (XARELTO) tablet 15 mg  15 mg Oral Daily Emokpae, Ejiroghene E, MD   15 mg at 03/06/18 8657     Discharge Medications: Please see discharge summary for a list of discharge medications.  Relevant Imaging Results:  Relevant Lab Results:   Additional Information    Shade Flood, LCSW

## 2018-03-06 NOTE — Clinical Social Work Note (Signed)
Pt is a 72 year old female admitted from [facility] rehab. Per MD, pt is stable for dc today. Pt aware and in agreement with plan for return to [facility]. Spoke with pt's husband by phone to update on plan. Will send dc clinical through [employer] when it is available. Updated pt's RN, Anderson Malta, who will call report to [facility] [phone]) and arrange EMS [phone]) when pt is ready for dc.  There are no other CSW needs at this time.

## 2018-03-06 NOTE — Progress Notes (Signed)
Patient alert and oriented x4, No complaints of pain, shortness of breath, chest pain, dizziness, nausea or vomiting. Patient tolerated PO meds and diet well. Appetite good. Nurse to nurse report called and given to [place], nurse at [place]. IV removed by RN without complications. Patient discharged back to [facility] with all belongings and discharge summary and script (Oxy) in vanilla envelope via ambulance.

## 2018-03-06 NOTE — [place] FL2 (Signed)
[place] MEDICAID FL2 LEVEL OF CARE SCREENING TOOL     IDENTIFICATION  Patient Name: [name] Birthdate: 1945-04-17 Sex: female Admission Date (Current Location): 03/04/2018  [facility] and [place] Number:  [employer] and Address:  [facility] [address] [place]      Provider Number: [id number]  Attending Physician Name and Address:  Barton Dubois, MD  Relative Name and Phone Number:       Current Level of Care: Hospital Recommended Level of Care: [place] Prior Approval Number:    Date Approved/Denied:   PASRR Number:    Discharge Plan: SNF    Current Diagnoses: Patient Active Problem List   Diagnosis Date Noted  . Sepsis ([place]) 10/04/2017  . HCAP (healthcare-associated pneumonia) 10/04/2017  . Acute lower UTI 10/04/2017  . Hallucinations 07/25/2017  . Abnormal LFTs 07/24/2017  . Acute on chronic diastolic CHF (congestive heart failure) ([place])   . Elevated LFTs   . AKI (acute kidney injury) ([place])   . Elevated troponin   . Stroke, acute, embolic ([place]) 33/29/5188  . Mitral regurgitation 06/17/2017  . Acute CVA (cerebrovascular accident) ([place])   . Thyroid nodule 06/15/2017  . Atrial fibrillation with RVR ([place]) 06/14/2017  . CVA (cerebral vascular accident) ([place]) 06/14/2017  . Type II diabetes mellitus ([place])   . Peripheral vascular disease ([place])   . Hyperlipidemia   . Hiatal hernia   . Edema   . Chronic diastolic (congestive) heart failure ([place])   . Carpal tunnel syndrome   . Atrial fibrillation ([place])   . Anemia   . Actinic keratosis   . Intractable nausea and vomiting 05/31/2016  . Nausea & vomiting 05/30/2016  . Abnormal chest x-ray 05/30/2016  . Diabetes mellitus with complication ([place])   . Influenza A 05/21/2016  . Chronic atrial fibrillation 05/20/2016  . Acute CHF (congestive heart failure) ([place]) 05/20/2016  . History of GI bleed 03/29/2016  . Chronic anticoagulation 03/29/2016  .  Symptomatic anemia 03/19/2016  . Spinal stenosis of lumbar region 02/10/2016  . Varicose veins of left leg with edema 09/15/2015  . Preop cardiovascular exam 08/25/2015  . Iron deficiency anemia due to chronic blood loss 07/17/2015  . Anemia of chronic renal failure, stage 3 (moderate) ([place]) 07/17/2015  . Anticoagulant causing adverse effect in therapeutic use 07/17/2015  . Symptomatic bradycardia 02/21/2015  . Bradycardia   . Acute deep vein thrombosis (DVT) of left lower extremity ([place]) 05/06/2014  . Abdominal wall mass of right lower quadrant 07/08/2013  . OA (osteoarthritis) of hip 04/03/2013  . Osteoarthritis of hip 01/18/2013  . Night sweats 09/06/2012  . Chest pain   . Anxiety and depression   . Gastroesophageal reflux disease   . Hepatitis   . Jaundice   . Hypertension 07/01/2010  . Hypercholesterolemia 07/01/2010  . DM2 (diabetes mellitus, type 2) ([place]) 07/01/2010  . Rheumatoid arthritis ([place]) 07/01/2010  . Sinus tachycardia 07/01/2010  . Chest pain, atypical 07/01/2010    Orientation RESPIRATION BLADDER Height & Weight     Self, Place, Situation  Normal Continent Weight: 194 lb 7.1 oz (88.2 kg) Height:  5\' 7"  (170.2 cm)  BEHAVIORAL SYMPTOMS/MOOD NEUROLOGICAL BOWEL NUTRITION STATUS      Continent Diet(see dc summary)  AMBULATORY STATUS COMMUNICATION OF NEEDS Skin   Limited Assist Verbally Normal                       Personal Care Assistance Level of  Assistance  Bathing, Feeding, Dressing Bathing Assistance: Limited assistance Feeding assistance: Limited assistance Dressing Assistance: Limited assistance     Functional Limitations Info  Sight, Hearing, Speech Sight Info: Adequate Hearing Info: Adequate Speech Info: Adequate    SPECIAL CARE FACTORS FREQUENCY  PT (By licensed PT)     PT Frequency: 5 times week              Contractures Contractures Info: Not present    Additional Factors Info  Code Status, Allergies, Psychotropic Code  Status Info: Full Allergies Info: Promethazine Psychotropic Info: Welbutrin, Seroquel         Current Medications (03/06/2018):  This is the current hospital active medication list Current Facility-Administered Medications  Medication Dose Route Frequency Provider Last Rate Last Dose  . 0.9 %  sodium chloride infusion   Intravenous PRN Roxan Hockey, MD 10 mL/hr at 03/06/18 0658    . acetaminophen (TYLENOL) tablet 650 mg  650 mg Oral Q6H PRN Emokpae, Ejiroghene E, MD       Or  . acetaminophen (TYLENOL) suppository 650 mg  650 mg Rectal Q6H PRN Emokpae, Ejiroghene E, MD      . buPROPion (WELLBUTRIN SR) 12 hr tablet 150 mg  150 mg Oral BID Emokpae, Ejiroghene E, MD   150 mg at 03/06/18 0912  . cefTRIAXone (ROCEPHIN) 1 g in sodium chloride 0.9 % 100 mL IVPB  1 g Intravenous Q24H Emokpae, Ejiroghene E, MD   Stopped at 03/05/18 2128  . Chlorhexidine Gluconate Cloth 2 % PADS 6 each  6 each Topical Q0600 Barton Dubois, MD      . fluticasone Asencion Islam) 50 MCG/ACT nasal spray 1 spray  1 spray Each Nare Daily Barton Dubois, MD   1 spray at 03/06/18 0911  . gabapentin (NEURONTIN) capsule 300 mg  300 mg Oral TID Emokpae, Ejiroghene E, MD   300 mg at 03/06/18 0913  . glimepiride (AMARYL) tablet 2 mg  2 mg Oral QAC breakfast Emokpae, Ejiroghene E, MD   2 mg at 03/06/18 0735  . insulin aspart (novoLOG) injection 0-9 Units  0-9 Units Subcutaneous TID WC Emokpae, Ejiroghene E, MD   2 Units at 03/06/18 1136  . metoprolol tartrate (LOPRESSOR) injection 5 mg  5 mg Intravenous Q1H PRN Opyd, Ilene Qua, MD   5 mg at 03/05/18 0531  . metoprolol tartrate (LOPRESSOR) tablet 100 mg  100 mg Oral TID Barton Dubois, MD   100 mg at 03/06/18 0914  . mupirocin ointment (BACTROBAN) 2 % 1 application  1 application Nasal BID Barton Dubois, MD   1 application at 09/38/18 0911  . nystatin (MYCOSTATIN/NYSTOP) topical powder   Topical TID Bodenheimer, Charles A, NP      . ondansetron (ZOFRAN) tablet 4 mg  4 mg Oral Q6H PRN  Emokpae, Ejiroghene E, MD       Or  . ondansetron (ZOFRAN) injection 4 mg  4 mg Intravenous Q6H PRN Emokpae, Ejiroghene E, MD      . oxyCODONE (Oxy IR/ROXICODONE) immediate release tablet 5 mg  5 mg Oral Q6H PRN Emokpae, Ejiroghene E, MD   5 mg at 03/06/18 0306  . pantoprazole (PROTONIX) EC tablet 40 mg  40 mg Oral Daily Emokpae, Ejiroghene E, MD   40 mg at 03/06/18 0912  . polyethylene glycol (MIRALAX / GLYCOLAX) packet 17 g  17 g Oral Daily PRN Emokpae, Ejiroghene E, MD      . predniSONE (DELTASONE) tablet 5 mg  5 mg Oral Daily Emokpae, Ejiroghene E,  MD   5 mg at 03/06/18 0913  . QUEtiapine (SEROQUEL) tablet 25 mg  25 mg Oral TID PRN Emokpae, Ejiroghene E, MD      . Rivaroxaban (XARELTO) tablet 15 mg  15 mg Oral Daily Emokpae, Ejiroghene E, MD   15 mg at 03/06/18 7615     Discharge Medications: Please see discharge summary for a list of discharge medications.  Relevant Imaging Results:  Relevant Lab Results:   Additional Information    Shade Flood, LCSW

## 2018-03-07 DIAGNOSIS — R609 Edema, unspecified: Secondary | ICD-10-CM | POA: Diagnosis not present

## 2018-03-07 DIAGNOSIS — I4891 Unspecified atrial fibrillation: Secondary | ICD-10-CM | POA: Diagnosis not present

## 2018-03-08 DIAGNOSIS — R609 Edema, unspecified: Secondary | ICD-10-CM | POA: Diagnosis not present

## 2018-03-08 DIAGNOSIS — I4891 Unspecified atrial fibrillation: Secondary | ICD-10-CM | POA: Diagnosis not present

## 2018-03-09 DIAGNOSIS — M069 Rheumatoid arthritis, unspecified: Secondary | ICD-10-CM | POA: Diagnosis not present

## 2018-03-09 DIAGNOSIS — R609 Edema, unspecified: Secondary | ICD-10-CM | POA: Diagnosis not present

## 2018-03-09 DIAGNOSIS — I4891 Unspecified atrial fibrillation: Secondary | ICD-10-CM | POA: Diagnosis not present

## 2018-03-09 DIAGNOSIS — L03211 Cellulitis of face: Secondary | ICD-10-CM | POA: Diagnosis not present

## 2018-03-09 DIAGNOSIS — I10 Essential (primary) hypertension: Secondary | ICD-10-CM | POA: Diagnosis not present

## 2018-03-09 DIAGNOSIS — I482 Chronic atrial fibrillation, unspecified: Secondary | ICD-10-CM | POA: Diagnosis not present

## 2018-03-10 DIAGNOSIS — R609 Edema, unspecified: Secondary | ICD-10-CM | POA: Diagnosis not present

## 2018-03-10 DIAGNOSIS — I4891 Unspecified atrial fibrillation: Secondary | ICD-10-CM | POA: Diagnosis not present

## 2018-03-12 DIAGNOSIS — L720 Epidermal cyst: Secondary | ICD-10-CM | POA: Diagnosis not present

## 2018-03-12 DIAGNOSIS — D485 Neoplasm of uncertain behavior of skin: Secondary | ICD-10-CM | POA: Diagnosis not present

## 2018-03-12 DIAGNOSIS — L820 Inflamed seborrheic keratosis: Secondary | ICD-10-CM | POA: Diagnosis not present

## 2018-03-12 DIAGNOSIS — I4891 Unspecified atrial fibrillation: Secondary | ICD-10-CM | POA: Diagnosis not present

## 2018-03-12 DIAGNOSIS — R609 Edema, unspecified: Secondary | ICD-10-CM | POA: Diagnosis not present

## 2018-03-13 DIAGNOSIS — I4891 Unspecified atrial fibrillation: Secondary | ICD-10-CM | POA: Diagnosis not present

## 2018-03-13 DIAGNOSIS — R609 Edema, unspecified: Secondary | ICD-10-CM | POA: Diagnosis not present

## 2018-03-14 DIAGNOSIS — I4891 Unspecified atrial fibrillation: Secondary | ICD-10-CM | POA: Diagnosis not present

## 2018-03-14 DIAGNOSIS — R609 Edema, unspecified: Secondary | ICD-10-CM | POA: Diagnosis not present

## 2018-03-15 DIAGNOSIS — R609 Edema, unspecified: Secondary | ICD-10-CM | POA: Diagnosis not present

## 2018-03-15 DIAGNOSIS — I4891 Unspecified atrial fibrillation: Secondary | ICD-10-CM | POA: Diagnosis not present

## 2018-03-16 DIAGNOSIS — R609 Edema, unspecified: Secondary | ICD-10-CM | POA: Diagnosis not present

## 2018-03-16 DIAGNOSIS — I4891 Unspecified atrial fibrillation: Secondary | ICD-10-CM | POA: Diagnosis not present

## 2018-03-19 DIAGNOSIS — I4891 Unspecified atrial fibrillation: Secondary | ICD-10-CM | POA: Diagnosis not present

## 2018-03-19 DIAGNOSIS — R609 Edema, unspecified: Secondary | ICD-10-CM | POA: Diagnosis not present

## 2018-03-20 DIAGNOSIS — I4891 Unspecified atrial fibrillation: Secondary | ICD-10-CM | POA: Diagnosis not present

## 2018-03-20 DIAGNOSIS — R609 Edema, unspecified: Secondary | ICD-10-CM | POA: Diagnosis not present

## 2018-03-21 DIAGNOSIS — R609 Edema, unspecified: Secondary | ICD-10-CM | POA: Diagnosis not present

## 2018-03-21 DIAGNOSIS — I4891 Unspecified atrial fibrillation: Secondary | ICD-10-CM | POA: Diagnosis not present

## 2018-03-22 DIAGNOSIS — I4891 Unspecified atrial fibrillation: Secondary | ICD-10-CM | POA: Diagnosis not present

## 2018-03-22 DIAGNOSIS — R609 Edema, unspecified: Secondary | ICD-10-CM | POA: Diagnosis not present

## 2018-03-23 DIAGNOSIS — H353131 Nonexudative age-related macular degeneration, bilateral, early dry stage: Secondary | ICD-10-CM | POA: Diagnosis not present

## 2018-03-23 DIAGNOSIS — E119 Type 2 diabetes mellitus without complications: Secondary | ICD-10-CM | POA: Diagnosis not present

## 2018-03-23 DIAGNOSIS — I4891 Unspecified atrial fibrillation: Secondary | ICD-10-CM | POA: Diagnosis not present

## 2018-03-23 DIAGNOSIS — R609 Edema, unspecified: Secondary | ICD-10-CM | POA: Diagnosis not present

## 2018-03-23 DIAGNOSIS — H35033 Hypertensive retinopathy, bilateral: Secondary | ICD-10-CM | POA: Diagnosis not present

## 2018-03-23 DIAGNOSIS — H40013 Open angle with borderline findings, low risk, bilateral: Secondary | ICD-10-CM | POA: Diagnosis not present

## 2018-03-25 DIAGNOSIS — I4891 Unspecified atrial fibrillation: Secondary | ICD-10-CM | POA: Diagnosis not present

## 2018-03-25 DIAGNOSIS — R609 Edema, unspecified: Secondary | ICD-10-CM | POA: Diagnosis not present

## 2018-03-26 DIAGNOSIS — R609 Edema, unspecified: Secondary | ICD-10-CM | POA: Diagnosis not present

## 2018-03-26 DIAGNOSIS — I4891 Unspecified atrial fibrillation: Secondary | ICD-10-CM | POA: Diagnosis not present

## 2018-03-29 DIAGNOSIS — I4891 Unspecified atrial fibrillation: Secondary | ICD-10-CM | POA: Diagnosis not present

## 2018-03-29 DIAGNOSIS — R609 Edema, unspecified: Secondary | ICD-10-CM | POA: Diagnosis not present

## 2018-03-30 DIAGNOSIS — I4891 Unspecified atrial fibrillation: Secondary | ICD-10-CM | POA: Diagnosis not present

## 2018-03-30 DIAGNOSIS — R609 Edema, unspecified: Secondary | ICD-10-CM | POA: Diagnosis not present

## 2018-03-31 DIAGNOSIS — I4891 Unspecified atrial fibrillation: Secondary | ICD-10-CM | POA: Diagnosis not present

## 2018-03-31 DIAGNOSIS — R609 Edema, unspecified: Secondary | ICD-10-CM | POA: Diagnosis not present

## 2018-04-01 DIAGNOSIS — R609 Edema, unspecified: Secondary | ICD-10-CM | POA: Diagnosis not present

## 2018-04-01 DIAGNOSIS — I4891 Unspecified atrial fibrillation: Secondary | ICD-10-CM | POA: Diagnosis not present

## 2018-04-02 DIAGNOSIS — R609 Edema, unspecified: Secondary | ICD-10-CM | POA: Diagnosis not present

## 2018-04-02 DIAGNOSIS — I4891 Unspecified atrial fibrillation: Secondary | ICD-10-CM | POA: Diagnosis not present

## 2018-04-03 DIAGNOSIS — R609 Edema, unspecified: Secondary | ICD-10-CM | POA: Diagnosis not present

## 2018-04-03 DIAGNOSIS — I4891 Unspecified atrial fibrillation: Secondary | ICD-10-CM | POA: Diagnosis not present

## 2018-04-05 DIAGNOSIS — I4891 Unspecified atrial fibrillation: Secondary | ICD-10-CM | POA: Diagnosis not present

## 2018-04-05 DIAGNOSIS — R609 Edema, unspecified: Secondary | ICD-10-CM | POA: Diagnosis not present

## 2018-04-06 DIAGNOSIS — R609 Edema, unspecified: Secondary | ICD-10-CM | POA: Diagnosis not present

## 2018-04-06 DIAGNOSIS — I4891 Unspecified atrial fibrillation: Secondary | ICD-10-CM | POA: Diagnosis not present

## 2018-04-07 DIAGNOSIS — I4891 Unspecified atrial fibrillation: Secondary | ICD-10-CM | POA: Diagnosis not present

## 2018-04-07 DIAGNOSIS — R609 Edema, unspecified: Secondary | ICD-10-CM | POA: Diagnosis not present

## 2018-04-09 DIAGNOSIS — R609 Edema, unspecified: Secondary | ICD-10-CM | POA: Diagnosis not present

## 2018-04-09 DIAGNOSIS — I4891 Unspecified atrial fibrillation: Secondary | ICD-10-CM | POA: Diagnosis not present

## 2018-04-10 DIAGNOSIS — R609 Edema, unspecified: Secondary | ICD-10-CM | POA: Diagnosis not present

## 2018-04-10 DIAGNOSIS — I4891 Unspecified atrial fibrillation: Secondary | ICD-10-CM | POA: Diagnosis not present

## 2018-04-11 DIAGNOSIS — R609 Edema, unspecified: Secondary | ICD-10-CM | POA: Diagnosis not present

## 2018-04-11 DIAGNOSIS — I4891 Unspecified atrial fibrillation: Secondary | ICD-10-CM | POA: Diagnosis not present

## 2018-04-12 DIAGNOSIS — R609 Edema, unspecified: Secondary | ICD-10-CM | POA: Diagnosis not present

## 2018-04-12 DIAGNOSIS — I4891 Unspecified atrial fibrillation: Secondary | ICD-10-CM | POA: Diagnosis not present

## 2018-04-13 DIAGNOSIS — I4891 Unspecified atrial fibrillation: Secondary | ICD-10-CM | POA: Diagnosis not present

## 2018-04-13 DIAGNOSIS — R609 Edema, unspecified: Secondary | ICD-10-CM | POA: Diagnosis not present

## 2018-04-14 DIAGNOSIS — I4891 Unspecified atrial fibrillation: Secondary | ICD-10-CM | POA: Diagnosis not present

## 2018-04-14 DIAGNOSIS — R609 Edema, unspecified: Secondary | ICD-10-CM | POA: Diagnosis not present

## 2018-04-16 DIAGNOSIS — R609 Edema, unspecified: Secondary | ICD-10-CM | POA: Diagnosis not present

## 2018-04-16 DIAGNOSIS — I4891 Unspecified atrial fibrillation: Secondary | ICD-10-CM | POA: Diagnosis not present

## 2018-04-17 DIAGNOSIS — M47812 Spondylosis without myelopathy or radiculopathy, cervical region: Secondary | ICD-10-CM | POA: Diagnosis not present

## 2018-05-02 DIAGNOSIS — D500 Iron deficiency anemia secondary to blood loss (chronic): Secondary | ICD-10-CM | POA: Diagnosis not present

## 2018-05-02 DIAGNOSIS — M2550 Pain in unspecified joint: Secondary | ICD-10-CM | POA: Diagnosis not present

## 2018-05-02 DIAGNOSIS — R600 Localized edema: Secondary | ICD-10-CM | POA: Diagnosis not present

## 2018-05-02 DIAGNOSIS — I63411 Cerebral infarction due to embolism of right middle cerebral artery: Secondary | ICD-10-CM | POA: Diagnosis not present

## 2018-05-02 DIAGNOSIS — E663 Overweight: Secondary | ICD-10-CM | POA: Diagnosis not present

## 2018-05-02 DIAGNOSIS — Z6829 Body mass index (BMI) 29.0-29.9, adult: Secondary | ICD-10-CM | POA: Diagnosis not present

## 2018-05-02 DIAGNOSIS — Z7952 Long term (current) use of systemic steroids: Secondary | ICD-10-CM | POA: Diagnosis not present

## 2018-05-02 DIAGNOSIS — M5030 Other cervical disc degeneration, unspecified cervical region: Secondary | ICD-10-CM | POA: Diagnosis not present

## 2018-05-02 DIAGNOSIS — M0589 Other rheumatoid arthritis with rheumatoid factor of multiple sites: Secondary | ICD-10-CM | POA: Diagnosis not present

## 2018-05-02 DIAGNOSIS — Z79899 Other long term (current) drug therapy: Secondary | ICD-10-CM | POA: Diagnosis not present

## 2018-05-02 DIAGNOSIS — M25561 Pain in right knee: Secondary | ICD-10-CM | POA: Diagnosis not present

## 2018-05-02 DIAGNOSIS — M25532 Pain in left wrist: Secondary | ICD-10-CM | POA: Diagnosis not present

## 2018-05-10 ENCOUNTER — Ambulatory Visit (INDEPENDENT_AMBULATORY_CARE_PROVIDER_SITE_OTHER): Payer: Medicare Other | Admitting: Adult Health

## 2018-05-10 ENCOUNTER — Encounter: Payer: Self-pay | Admitting: Adult Health

## 2018-05-10 VITALS — BP 134/69 | HR 80 | Ht 67.0 in

## 2018-05-10 DIAGNOSIS — E785 Hyperlipidemia, unspecified: Secondary | ICD-10-CM | POA: Diagnosis not present

## 2018-05-10 DIAGNOSIS — Z8673 Personal history of transient ischemic attack (TIA), and cerebral infarction without residual deficits: Secondary | ICD-10-CM | POA: Diagnosis not present

## 2018-05-10 DIAGNOSIS — I10 Essential (primary) hypertension: Secondary | ICD-10-CM

## 2018-05-10 DIAGNOSIS — I480 Paroxysmal atrial fibrillation: Secondary | ICD-10-CM | POA: Diagnosis not present

## 2018-05-10 DIAGNOSIS — E118 Type 2 diabetes mellitus with unspecified complications: Secondary | ICD-10-CM

## 2018-05-10 NOTE — Progress Notes (Signed)
I agree with the above plan 

## 2018-05-10 NOTE — Progress Notes (Signed)
[facility] [address] [place]. [place] [address] [phone]       OFFICE FOLLOW UP NOTE  Christina. [name] Date of Birth:  Sep 13, 1945 Medical Record Number:  [id number]    Reason for Referral:  hospital stroke follow up   CHIEF COMPLAINT:  Chief Complaint  Patient presents with  . Follow-up    Stroke follow up room 9 pt alone in wheelchair, pt at [facility] facility     HPI: 05/10/18  [name] is a 73 y.o. female who is being seen today for stroke follow-up.  ED admission on 03/04/2018 for initial complaints of facial swelling but was found to be in atrial fibrillation with RVR and diagnosed with left facial cellulitis.  Medications adjusted for heart rate control with recommended follow-up with cardiology.  She continues on Xarelto without side effects of bleeding or bruising.  She also continues on metoprolol without reported increased heart rate recently and with today's HR 80.  She does have appointment scheduled with cardiology on 05/15/2018.  BP satisfactory 134/69. She has been ambulating with Rolator walker which she obtained last week. She is currently in a w/c for transportation purposes. She continues to reside at [facility] [place] [place]. Denies any recent falls. She has been doing very well from a stroke standpoint without any residual deficits or new stroke/TIA symptoms.     HISTORY SUMMARY:  [name] is being seen today for initial visit in the office for right MCA and right PCA on 06/14/17. History obtained from patient, daughter, son and chart review. Reviewed all radiology images and labs personally.  [name] is a 73 year old female with history of CKD, A. fib on Xarelto, CHF, HLD, HTN, PVD, diabetes, rheumatoid arthritis and history of stroke admitted for headache and decreased vision.  Exam showed left hemianopia.  She was on Xarelto until last Monday due to upcoming procedures.  CT head reviewed and showed right PCA subacute  stroke.  MRI brain reviewed and showed right PCA subacute stroke with petechiae hemorrhage as well as right MCA inferior territory acute infarct.  CTA head and neck unremarkable.  2D echo EF 60-65%.  LDL 66 and A1c 6.9. Patient stroke most likely due to A. fib not on anti-coagulation due to upcoming procedure.  Currently not able to restart [facility] due to large size of the stroke.  Continue aspirin 325.  Resume Xarelto in 7 days for stroke prevention.  Aspirin can be discontinued at that time.  Continue Crestor.  PT/OT evaluation Recommend home PT/OT.  Since discharge, patient continues to participate in home PT. Her vision has been improving and feels as though most of it has resolved but does wax/wane at times especially with increased fatigue. Continues to take Xarelto without side effects of bleeding or bruising. Continues to take crestor but since increase, has increased complaints of muscle aches and pain. BP stable at 140/84 and this is checked during PT sessions. Family concerned about worsening memory since stroke. AFT 8, recall 2/3 and clock drawing 3/4. Currently sitting in a w/c which patient did not have to use prior to stroke. Patient has old right leg injury with decreased muscle tone. She states this worsened with recent stroke but is working with PT and doing HEP daily. Patient currently living with family but able to do most ADLs with assistance in IADLs. Recently accepted to [facility] and will be transporting her there today or tomorrow. Does have complaints of frequent snoring, restless  leg and daytime fatigue.  Has not been assessed for sleep apnea in the past.  Denies new or worsening stroke/TIA symptoms.   Interval history: Since previous follow-up, patient was seen in the ED on 10/04/2017 for shortness of breath and was admitted for pneumonia, UTI and atrial fibrillation and RVR along with being found in severe sepsis.  Patient was treated with antibiotics along with IV diltiazem.   Patient was discharged back to SNF in stable condition.  Was also recommended for patient to obtain a sleep study to rule out OSA but [facility] sleep lab was unable to contact [place] from the facility after calling multiple times.   Patient is being seen today for routine stroke follow-up and is accompanied by a CNA from [facility] where she currently resides.  Overall, she is recovered well from hospitalization with pneumonia, UTI and A. fib with RVR.  She states that prior to going into the hospital, she was able to ambulate without assistive device but has had to return back to using rolling walker.  She is currently sitting in wheelchair due to needing to be in a wheelchair for transportation.  She continues with PT with a goal of being able to ambulate again without assistive device.  She continues to take Xarelto without side effects of bleeding or bruising.  Continues to take Crestor without further complaints of myalgias.  At previous visit, it was discussed that patient had complaints of snoring, restless legs and daytime fatigue.  Patient no longer endorses the symptoms therefore we will hold off on sleep apnea testing at this time.  Patient has followed with her cardiologist in regards to atrial fibrillation and Eliquis management.  Blood pressure today satisfactory 122/66.  Patient denies new or worsening stroke/TIA symptoms.    ROS:   14 system review of systems performed and negative with exception of no complaints  PMH:  Past Medical History:  Diagnosis Date  . Abnormal LFTs 07/24/2017  . Actinic keratosis   . Anemia    hx of  . Anemia of chronic renal failure, stage 3 (moderate) ([place]) 07/17/2015  . Anticoagulant causing adverse effect in therapeutic use 07/17/2015  . Anxiety   . Arthritis    rheumatoid  . Atrial fibrillation ([place])    a. persistent, she remains on Xarelto  . Carpal tunnel syndrome   . Chronic diastolic (congestive) heart failure ([place])    a. 04/2016: echo showing a  preserved EF of 55-60% with mild MR. LA and RA midly dilated.   . Edema    left leg at ankle resolved now  . Gastroesophageal reflux disease   . Hepatitis 1970   not sure what kind  . Hiatal hernia   . Hyperlipidemia   . Hypertension   . Iron deficiency anemia due to chronic blood loss 07/17/2015  . Jaundice    age 42  . Peripheral vascular disease ([place]) yrs ago   DVT left lower leg questionale told by 2 drs i had no clot, 1 md said i did  . Rheumatoid arthritis(714.0)   . Right knee pain   . Stroke ([place])   . Type II diabetes mellitus ([place])    type2    PSH:  Past Surgical History:  Procedure Laterality Date  . CATARACT EXTRACTION Bilateral   . COLONOSCOPY WITH PROPOFOL N/A 02/20/2015   Procedure: COLONOSCOPY WITH PROPOFOL;  Surgeon: Carol Ada, MD;  Location: [facility];  Service: Endoscopy;  Laterality: N/A;  . ENTEROSCOPY N/A 03/23/2016  Procedure: ENTEROSCOPY;  Surgeon: Carol Ada, MD;  Location: [facility];  Service: Endoscopy;  Laterality: N/A;  . ENTEROSCOPY N/A 06/17/2016   Procedure: ENTEROSCOPY;  Surgeon: Carol Ada, MD;  Location: [facility];  Service: Endoscopy;  Laterality: N/A;  . ESOPHAGOGASTRODUODENOSCOPY (EGD) WITH PROPOFOL N/A 02/20/2015   Procedure: ESOPHAGOGASTRODUODENOSCOPY (EGD) WITH PROPOFOL;  Surgeon: Carol Ada, MD;  Location: [facility];  Service: Endoscopy;  Laterality: N/A;  . GIVENS CAPSULE STUDY N/A 03/21/2016   Procedure: GIVENS CAPSULE STUDY;  Surgeon: Carol Ada, MD;  Location: [facility];  Service: Endoscopy;  Laterality: N/A;  . HAND SURGERY  1995 and 1996   artificial joints both hands  . JOINT REPLACEMENT    . LUMBAR LAMINECTOMY/DECOMPRESSION MICRODISCECTOMY N/A 02/10/2016   Procedure: MICROLUMBAR DECOMPRESSION L4-L5 AND L3- L4, AND EXCISION OF SYNOVIAL CYST L4-L5;  Surgeon: Susa Day, MD;  Location: [facility];  Service: Orthopedics;  Laterality: N/A;  . TOTAL HIP ARTHROPLASTY Left 2009  . TOTAL HIP ARTHROPLASTY Right  04/03/2013   Procedure: RIGHT TOTAL HIP ARTHROPLASTY ANTERIOR APPROACH;  Surgeon: Gearlean Alf, MD;  Location: [facility];  Service: Orthopedics;  Laterality: Right;    Social History:  Social History   Socioeconomic History  . Marital status: Divorced    Spouse name: Not on file  . Number of children: Not on file  . Years of education: Not on file  . Highest education level: Not on file  Occupational History  . Occupation: Retired  Scientific laboratory technician  . Financial resource strain: Not on file  . Food insecurity:    Worry: Not on file    Inability: Not on file  . Transportation needs:    Medical: Not on file    Non-medical: Not on file  Tobacco Use  . Smoking status: Never Smoker  . Smokeless tobacco: Never Used  Substance and Sexual Activity  . Alcohol use: No    Alcohol/week: 0.0 standard drinks  . Drug use: No  . Sexual activity: Not Currently  Lifestyle  . Physical activity:    Days per week: Not on file    Minutes per session: Not on file  . Stress: Not on file  Relationships  . Social connections:    Talks on phone: Not on file    Gets together: Not on file    Attends religious service: Not on file    Active member of club or organization: Not on file    Attends meetings of clubs or organizations: Not on file    Relationship status: Not on file  . Intimate partner violence:    Fear of current or ex partner: Not on file    Emotionally abused: Not on file    Physically abused: Not on file    Forced sexual activity: Not on file  Other Topics Concern  . Not on file  Social History Narrative  . Not on file    Family History:  Family History  Problem Relation Age of Onset  . Pneumonia Mother 62  . Stroke Father 72  . Heart failure Sister   . Hypertension Sister   . Heart attack Neg Hx     Medications:   Current Outpatient Medications on File Prior to Visit  Medication Sig Dispense Refill  . Abatacept (ORENCIA CLICKJECT) 671 MG/ML SOAJ Inject 125 mg into the  skin every Thursday.    [name] buPROPion (WELLBUTRIN SR) 150 MG 12 hr tablet Take 150 mg by mouth 2 (two) times daily.      [name]  Cholecalciferol (VITAMIN D3) 1000 units CAPS Take 1,000 Units by mouth every evening.     . ferrous gluconate (FERGON) 324 MG tablet Take 1 tablet (324 mg total) by mouth 2 (two) times daily with a meal. 60 tablet 3  . furosemide (LASIX) 40 MG tablet Take 40 mg by mouth daily.     [name] gabapentin (NEURONTIN) 100 MG capsule Take 3 capsules (300 mg total) by mouth 3 (three) times daily.    [name] glimepiride (AMARYL) 2 MG tablet TAKE 1 TABLET DAILY BEFORE BREAKFAST    . leflunomide (ARAVA) 10 MG tablet Take 10 mg by mouth daily.    [name] lidocaine (LIDODERM) 5 % Place 1 patch onto the skin daily. Remove & Discard patch within 12 hours or as directed by MD    . metFORMIN (GLUCOPHAGE-XR) 500 MG 24 hr tablet Take 500 mg by mouth 2 (two) times daily.     . metoprolol tartrate (LOPRESSOR) 100 MG tablet Take 1 tablet (100 mg total) by mouth 3 (three) times daily.    . Multiple Vitamins-Minerals (PRESERVISION AREDS 2) CAPS Take 1 capsule by mouth daily.     . nitroGLYCERIN (NITROSTAT) 0.4 MG SL tablet Place 1 tablet (0.4 mg total) under the tongue every 5 (five) minutes as needed for chest pain (x 3 doses). 25 tablet 2  . omeprazole (PRILOSEC) 20 MG capsule Take 1 capsule by mouth daily.    [name] oxycodone (OXY-IR) 5 MG capsule Take 1 capsule (5 mg total) by mouth every 6 (six) hours as needed (severe pain). 20 capsule 0  . potassium chloride SA (K-DUR,KLOR-CON) 20 MEQ tablet Take 1 tablet (20 mEq total) by mouth 2 (two) times daily.    . predniSONE (DELTASONE) 5 MG tablet Take 5 mg by mouth daily.    . QUEtiapine (SEROQUEL) 25 MG tablet Take 1 tablet (25 mg total) by mouth 3 (three) times daily as needed (delerium or aggitation). 30 tablet 0  . Rivaroxaban (XARELTO) 15 MG TABS tablet Take 1 tablet (15 mg total) by mouth daily. 30 tablet 0  . sennosides-docusate sodium (SENOKOT-S) 8.6-50 MG tablet Take  1 tablet by mouth daily.    [name] triamcinolone cream (KENALOG) 0.1 % Apply 1 application topically 2 (two) times daily.    [name] VASCEPA 1 g CAPS Take 1 capsule by mouth every morning.    . vitamin B-12 1000 MCG tablet Take 1 tablet (1,000 mcg total) by mouth every evening. (Patient taking differently: Take 2,000 mcg by mouth every evening. )    . VOLTAREN 1 % GEL Apply 2 g topically at bedtime as needed for pain. Knees, calf  2   No current facility-administered medications on file prior to visit.     Allergies:   Allergies  Allergen Reactions  . Promethazine      Physical Exam  Vitals:   05/10/18 1024  BP: 134/69  Pulse: 80  Height: 5\' 7"  (1.702 m)   Body mass index is 30.45 kg/m. No exam data present  General: well developed, pleasant elderly caucasian female, well nourished, seated, in no evident distress Head: head normocephalic and atraumatic.   Neck: supple with no carotid or supraclavicular bruits Cardiovascular: irregular rate and rhythm, no murmurs Musculoskeletal: no deformity Skin:  no rash/petichiae Vascular:  Normal pulses all extremities  Neurologic Exam Mental Status: Awake and fully alert. Oriented to place and time.  Recent and remote memory intact.  Fund of knowledge appropriate. Cranial Nerves: Fundoscopic exam reveals sharp disc margins. Pupils equal,  briskly reactive to light. Extraocular movements full without nystagmus. No evidence of residual peripheral visual loss. Hearing intact. Facial sensation intact. Face, tongue, palate moves normally and symmetrically.  Motor: Normal bulk and tone.  Equal strength in all tested extremities. Sensory.: intact to touch , pinprick , position and vibratory sensation.  Coordination: Rapid alternating movements normal in all extremities. Finger-to-nose and heel-to-shin performed accurately bilaterally. Gait and Station: She is currently sitting in wheelchair without Rollator walker present at appointment therefore gait  assessment deferred Reflexes: 1+ and symmetric. Toes downgoing.     Diagnostic Data (Labs, Imaging, Testing)  CT HEAD WO CONTRAST 06/14/17 IMPRESSION: 1. Acute right posterior cerebral artery infarct.  No hemorrhage. 2. Atrophy and chronic microvascular white matter ischemic changes. 3. Remote right cerebellar infarct.  CTA HEAD W OR WO CONTRAST CTA NECK W OR WO CONTRAST 06/14/17 IMPRESSION: 1. No emergent large vessel occlusion or flow-limiting stenosis. 2. Redemonstration of right PCA territory infarct with expected evolution, but no hemorrhage or mass effect. The right posterior cerebral artery is angiographically patent. 3. Mild atherosclerotic calcification at the aortic arch, carotid bifurcations and skull base segments of the internal carotid arteries without hemodynamically significant stenosis by NASCET criteria. ( Aortic Atherosclerosis (ICD10-I70.0). ) 4. **An incidental finding of potential clinical significance has been found. Multiple thyroid nodules, measuring up to 3 cm. Dedicated thyroid ultrasound is recommended for further characterization on a nonemergent outpatient basis. **  MR BRAIN WO CONTRAST 06/14/17 IMPRESSION: 1. Acute infarcts of the posterior right middle cerebral artery territory and the right posterior cerebral artery territory. 2. Petechial hemorrhage in the distribution of the right PCA infarct. No intraparenchymal hematoma. No mass effect or hydrocephalus. 3. Chronic small vessel disease.  ECHOCARDIOGRAM COMPLETE 06/16/17 Study Conclusions - Left ventricle: The cavity size was normal. There was moderate   concentric hypertrophy. Systolic function was normal. The   estimated ejection fraction was in the range of 60% to 65%. Wall   motion was normal; there were no regional wall motion   abnormalities. - Mitral valve: Calcified annulus. Moderately thickened, moderately   calcified leaflets . The findings are consistent with moderate    stenosis. There was mild to moderate regurgitation. Valve area by   pressure half-time: 2.22 cm^2. Valve area by continuity equation   (using LVOT flow): 1.53 cm^2. - Right ventricle: The cavity size was mildly dilated. Wall   thickness was normal. Systolic function was mildly reduced. - Pulmonary arteries: Systolic pressure was moderately increased.   PA peak pressure: 49 mm Hg (S). - Pericardium, extracardiac: There was no pericardial effusion.   ASSESSMENT: [name] is a 73 y.o. year old female here with right MCA and right PCA on 06/1317 secondary to atrial fibrillation. Vascular risk factors include atrial fibrillation and HLD.  Recent hospitalization on 10/04/2017 due to sepsis with pneumonia and UTI and A. fib and RVR with additional admission on 03/2018 with A. fib and RVR.  She is being seen today for follow-up visit and continues to be stable from a stroke standpoint without residual deficits or reoccurring of symptoms.    PLAN: -Continue Xarelto (rivaroxaban) daily for secondary stroke prevention -Crestor discontinued for unknown reasons and requested facility to consider reinitiating for secondary stroke prevention -F/u with PCP regarding your HLD, HTN and DM management  -F/u with cardiologist regarding atrial fibrillation and Xarelto managment -continue to monitor BP at home -Escorted patient to [place] sleep clinic to schedule initial consult for sleep apnea work-up -appointment scheduled with Dr.  Athar on 06/18/2018 -Maintain strict control of hypertension with blood pressure goal below 130/90, diabetes with hemoglobin A1c goal below 6.5% and cholesterol with LDL cholesterol (bad cholesterol) goal below 70 mg/dL. I also advised the patient to eat a healthy diet with plenty of whole grains, cereals, fruits and vegetables, exercise regularly and maintain ideal body weight.  She is stable from a stroke standpoint therefore recommended follow-up as needed or call with questions,  concerns or need of future follow-up appointment  Greater than 50% time during this 25 minute consultation visit was spent on counseling and coordination of care about HLD, and a fib, discussion about risk benefit of anticoagulation and answering questions.   Venancio Poisson, AGNP-BC  [facility] [address] [place] [place], [place] [address]  Phone [phone] Fax [phone]

## 2018-05-10 NOTE — Patient Instructions (Addendum)
Continue Xarelto (rivaroxaban) daily for secondary stroke prevention  Continue to follow up with PCP regarding cholesterol, diabetes and blood pressure management   Continue to follow with cardiology for atrial fibrillation management on 05/15/18  Appointment schedule for initial sleep apnea appointment consult  Continue to monitor blood pressure at home  Maintain strict control of hypertension with blood pressure goal below 130/90, diabetes with hemoglobin A1c goal below 6.5% and cholesterol with LDL cholesterol (bad cholesterol) goal below 70 mg/dL. I also advised the patient to eat a healthy diet with plenty of whole grains, cereals, fruits and vegetables, exercise regularly and maintain ideal body weight.  Followup in the future with me as needed or call earlier if needed       Thank you for coming to see [place] at [facility]. I hope we have been able to provide you high quality care today.  You may receive a patient satisfaction survey over the next few weeks. We would appreciate your feedback and comments so that we may continue to improve ourselves and the health of our patients.

## 2018-05-15 ENCOUNTER — Ambulatory Visit (INDEPENDENT_AMBULATORY_CARE_PROVIDER_SITE_OTHER): Payer: Medicare Other | Admitting: Physician Assistant

## 2018-05-15 ENCOUNTER — Encounter: Payer: Self-pay | Admitting: Physician Assistant

## 2018-05-15 VITALS — BP 148/96 | HR 127 | Ht 67.0 in | Wt 191.2 lb

## 2018-05-15 DIAGNOSIS — I4891 Unspecified atrial fibrillation: Secondary | ICD-10-CM | POA: Diagnosis not present

## 2018-05-15 DIAGNOSIS — I10 Essential (primary) hypertension: Secondary | ICD-10-CM | POA: Diagnosis not present

## 2018-05-15 DIAGNOSIS — I5032 Chronic diastolic (congestive) heart failure: Secondary | ICD-10-CM | POA: Diagnosis not present

## 2018-05-15 NOTE — Progress Notes (Signed)
Cardiology Office Note   Date:  05/15/2018   ID:  [name], DOB [birth year], MRN [id number]  PCP:  Aletha Halim., PA-C Cardiologist:  Kirk Ruths, MD 04/19/2017 Rosaria Ferries, PA-C 07/24/2017  No chief complaint on file.   History of Present Illness: [name] is a 73 y.o. female with a history of D-CHF, RA, CVA, hx of chronic A fib, nl MV 2016, nl EF echo 49/1791, embolic CVA, CKD III, HTN, HLD, DM2, GERD, RA, anxiety/depression, falls, anemia w/ iron deficiency, ?chronic blood loss  Admitted 12/1-12/06/2017 for atrial fibrillation, RVR, metoprolol increased, on Xarelto, also had left facial cellulitis  [name] presents for cardiology follow up.  She is at [facility]. She is able to go to Assisted Living but wants to go home with assistance 9 hrs a day. The noise wakes her up frequently at night.   She is aware that her HR will run up when she is aggravated or exerting herself. When her HR is elevated, she will feel tired.   She had her medications this am, but not long before she came here.   Her facial cellulitis has resolved. She had dental work the day before her admission.  She feels and the ER doctor seemed to feel that this because the facial cellulitis.  She used to have a BP cuff, does not now, but can get staff to check her heart rate or BP as needed.   She denies LE edema, orthopnea or PND.  She has chronic dyspnea on exertion, no recent change.  She has not had any chest pain with exertion.   Past Medical History:  Diagnosis Date  . Abnormal LFTs 07/24/2017  . Actinic keratosis   . Anemia    hx of  . Anemia of chronic renal failure, stage 3 (moderate) ([place]) 07/17/2015  . Anticoagulant causing adverse effect in therapeutic use 07/17/2015  . Anxiety   . Arthritis    rheumatoid  . Atrial fibrillation ([place])    a. persistent, she remains on Xarelto  . Carpal tunnel syndrome   . Chronic diastolic (congestive) heart  failure ([place])    a. 04/2016: echo showing a preserved EF of 55-60% with mild MR. LA and RA midly dilated.   . Edema    left leg at ankle resolved now  . Gastroesophageal reflux disease   . Hepatitis 1970   not sure what kind  . Hiatal hernia   . Hyperlipidemia   . Hypertension   . Iron deficiency anemia due to chronic blood loss 07/17/2015  . Jaundice    age 58  . Peripheral vascular disease ([place]) yrs ago   DVT left lower leg questionale told by 2 drs i had no clot, 1 md said i did  . Rheumatoid arthritis(714.0)   . Right knee pain   . Stroke ([place])   . Type II diabetes mellitus ([place])    type2    Past Surgical History:  Procedure Laterality Date  . CATARACT EXTRACTION Bilateral   . COLONOSCOPY WITH PROPOFOL N/A 02/20/2015   Procedure: COLONOSCOPY WITH PROPOFOL;  Surgeon: Carol Ada, MD;  [facility];  Service: Endoscopy;  Laterality: N/A;  . ENTEROSCOPY N/A 03/23/2016   Procedure: ENTEROSCOPY;  Surgeon: Carol Ada, MD;  [facility];  Service: Endoscopy;  Laterality: N/A;  . ENTEROSCOPY N/A 06/17/2016   Procedure: ENTEROSCOPY;  Surgeon: Carol Ada, MD;  [facility];  Service: Endoscopy;  Laterality: N/A;  .  ESOPHAGOGASTRODUODENOSCOPY (EGD) WITH PROPOFOL N/A 02/20/2015   Procedure: ESOPHAGOGASTRODUODENOSCOPY (EGD) WITH PROPOFOL;  Surgeon: Carol Ada, MD;  [facility];  Service: Endoscopy;  Laterality: N/A;  . GIVENS CAPSULE STUDY N/A 03/21/2016   Procedure: GIVENS CAPSULE STUDY;  Surgeon: Carol Ada, MD;  [facility];  Service: Endoscopy;  Laterality: N/A;  . HAND SURGERY  1995 and 1996   artificial joints both hands  . JOINT REPLACEMENT    . LUMBAR LAMINECTOMY/DECOMPRESSION MICRODISCECTOMY N/A 02/10/2016   Procedure: MICROLUMBAR DECOMPRESSION L4-L5 AND L3- L4, AND EXCISION OF SYNOVIAL CYST L4-L5;  Surgeon: Susa Day, MD;  [facility];  Service: Orthopedics;  Laterality: N/A;  . TOTAL HIP ARTHROPLASTY Left 2009   . TOTAL HIP ARTHROPLASTY Right 04/03/2013   Procedure: RIGHT TOTAL HIP ARTHROPLASTY ANTERIOR APPROACH;  Surgeon: Gearlean Alf, MD;  [facility];  Service: Orthopedics;  Laterality: Right;    Current Outpatient Medications  Medication Sig Dispense Refill  . Abatacept (ORENCIA CLICKJECT) 062 MG/ML SOAJ Inject 125 mg into the skin every Thursday.    [name] buPROPion (WELLBUTRIN SR) 150 MG 12 hr tablet Take 150 mg by mouth 2 (two) times daily.      . Cholecalciferol (VITAMIN D3) 1000 units CAPS Take 1,000 Units by mouth every evening.     . ferrous gluconate (FERGON) 324 MG tablet Take 1 tablet (324 mg total) by mouth 2 (two) times daily with a meal. 60 tablet 3  . furosemide (LASIX) 40 MG tablet Take 40 mg by mouth daily.     [name] gabapentin (NEURONTIN) 100 MG capsule Take 3 capsules (300 mg total) by mouth 3 (three) times daily.    [name] glimepiride (AMARYL) 2 MG tablet TAKE 1 TABLET DAILY BEFORE BREAKFAST    . leflunomide (ARAVA) 10 MG tablet Take 10 mg by mouth daily.    [name] lidocaine (LIDODERM) 5 % Place 1 patch onto the skin daily. Remove & Discard patch within 12 hours or as directed by MD    . metFORMIN (GLUCOPHAGE-XR) 500 MG 24 hr tablet Take 500 mg by mouth 2 (two) times daily.     . metoprolol tartrate (LOPRESSOR) 100 MG tablet Take 1 tablet (100 mg total) by mouth 3 (three) times daily.    . Multiple Vitamins-Minerals (PRESERVISION AREDS 2) CAPS Take 1 capsule by mouth daily.     . nitroGLYCERIN (NITROSTAT) 0.4 MG SL tablet Place 1 tablet (0.4 mg total) under the tongue every 5 (five) minutes as needed for chest pain (x 3 doses). 25 tablet 2  . omeprazole (PRILOSEC) 20 MG capsule Take 1 capsule by mouth daily.    [name] oxycodone (OXY-IR) 5 MG capsule Take 1 capsule (5 mg total) by mouth every 6 (six) hours as needed (severe pain). 20 capsule 0  . potassium chloride SA (K-DUR,KLOR-CON) 20 MEQ tablet Take 1 tablet (20 mEq total) by mouth 2 (two) times daily.    . predniSONE (DELTASONE) 5 MG  tablet Take 5 mg by mouth daily.    . Rivaroxaban (XARELTO) 15 MG TABS tablet Take 1 tablet (15 mg total) by mouth daily. 30 tablet 0  . sennosides-docusate sodium (SENOKOT-S) 8.6-50 MG tablet Take 1 tablet by mouth daily.    [name] triamcinolone cream (KENALOG) 0.1 % Apply 1 application topically 2 (two) times daily.    [name] VASCEPA 1 g CAPS Take 1 capsule by mouth every morning.    . vitamin B-12 1000 MCG tablet Take 1 tablet (1,000 mcg total) by mouth every evening. (  Patient taking differently: Take 2,000 mcg by mouth every evening. )    . VOLTAREN 1 % GEL Apply 2 g topically at bedtime as needed for pain. Knees, calf  2   No current facility-administered medications for this visit.     Allergies:   Promethazine    Social History:  The patient  reports that she has never smoked. She has never used smokeless tobacco. She reports that she does not drink alcohol or use drugs.   Family History:  The patient's family history includes Heart failure in her sister; Hypertension in her sister; Pneumonia (age of onset: 46) in her mother; Stroke (age of onset: 44) in her father.  She indicated that her mother is deceased. She indicated that her father is deceased. She indicated that her maternal grandmother is deceased. She indicated that her maternal grandfather is deceased. She indicated that her paternal grandmother is deceased. She indicated that her paternal grandfather is deceased. She indicated that the status of her neg hx is unknown.  ROS:  Please see the history of present illness. All other systems are reviewed and negative.    PHYSICAL EXAM: VS:  BP (!) 148/96   Pulse (!) 127   Ht 5\' 7"  (1.702 m)   Wt 191 lb 3.2 oz (86.7 kg)   BMI 29.95 kg/m  , BMI Body mass index is 29.95 kg/m. GEN: Well nourished, well developed, female in no acute distress HEENT: normal for age  Neck: no JVD, no carotid bruit, no masses Cardiac: Irreg R&R; no murmur, no rubs, or gallops Respiratory:  clear to  auscultation bilaterally, normal work of breathing GI: soft, nontender, nondistended, + BS MS: no deformity or atrophy; no edema; distal pulses are 2+ in all 4 extremities  Skin: warm and dry, no rash Neuro:  Strength and sensation are intact Psych: euthymic mood, full affect   EKG:  EKG is ordered today. The ekg ordered today demonstrates Afib, HR 127, no acute ischemic changes  ECHO: 07/24/2017 - Left ventricle: The cavity size was normal. Systolic function was   normal. The estimated ejection fraction was in the range of 50%   to 55%. EF may be underestimated due to underlying atrial   fibrillation with intermittent RVR Wall motion was normal; there   were no regional wall motion abnormalities. The study was not   technically sufficient to allow evaluation of LV diastolic   dysfunction due to atrial fibrillation. - Aortic valve: Severe diffuse calcification involving the   noncoronary cusp. - Mitral valve: Calcified annulus. There was mild regurgitation. - Pulmonary arteries: PA peak pressure: 36 mm Hg (S).  Impressions:  - The right ventricular systolic pressure was increased consistent   with mild pulmonary hypertension.   MYOVIEW: 03/13/2015 1. Low risk study 2. Nl perfusion 3. Afib precluded gating   Recent Labs: 10/07/2017: Magnesium 1.7 03/04/2018: ALT 19; B Natriuretic Peptide 353.0; TSH 1.244 03/06/2018: BUN 18; Creatinine, Ser 0.69; Hemoglobin 12.5; Platelets 202; Potassium 3.9; Sodium 136  CBC    Component Value Date/Time   WBC 12.9 (H) 03/06/2018 0349   RBC 4.83 03/06/2018 0349   HGB 12.5 03/06/2018 0349   HGB 10.9 (L) 04/19/2017 0947   HGB 11.8 08/17/2015 1318   HGB 12.2 07/08/2014 0934   HCT 41.8 03/06/2018 0349   HCT 35.2 04/19/2017 0947   HCT 40.1 08/17/2015 1318   HCT 39.7 07/08/2014 0934   PLT 202 03/06/2018 0349   PLT 343 04/19/2017 0947   MCV 86.5 03/06/2018  0349   MCV 78 (L) 04/19/2017 0947   MCV 75 (L) 08/17/2015 1318   MCV 78.8 (L)  07/08/2014 0934   MCH 25.9 (L) 03/06/2018 0349   MCHC 29.9 (L) 03/06/2018 0349   RDW 17.0 (H) 03/06/2018 0349   RDW 18.0 (H) 04/19/2017 0947   RDW 22.3 (H) 07/17/2015 1325   RDW 16.0 (H) 07/08/2014 0934   LYMPHSABS 0.9 03/04/2018 1323   LYMPHSABS 1.9 08/17/2015 1318   LYMPHSABS 2.4 07/08/2014 0934   MONOABS 0.9 03/04/2018 1323   MONOABS 1.1 (H) 07/08/2014 0934   EOSABS 0.1 03/04/2018 1323   EOSABS 0.2 08/17/2015 1318   BASOSABS 0.1 03/04/2018 1323   BASOSABS 0.0 08/17/2015 1318   BASOSABS 0.1 07/08/2014 0934   CMP Latest Ref Rng & Units 03/06/2018 03/05/2018 03/04/2018  Glucose 70 - 99 mg/dL 123(H) 288(H) 432(H)  BUN 8 - 23 mg/dL 18 22 23   Creatinine 0.44 - 1.00 mg/dL 0.69 0.78 1.02(H)  Sodium 135 - 145 mmol/L 136 139 133(L)  Potassium 3.5 - 5.1 mmol/L 3.9 4.4 5.0  Chloride 98 - 111 mmol/L 102 103 97(L)  CO2 22 - 32 mmol/L 25 27 24   Calcium 8.9 - 10.3 mg/dL 8.8(L) 9.0 9.3  Total Protein 6.5 - 8.1 g/dL - - 7.9  Total Bilirubin 0.3 - 1.2 mg/dL - - 1.7(H)  Alkaline Phos 38 - 126 U/L - - 114  AST 15 - 41 U/L - - 17  ALT 0 - 44 U/L - - 19     Lipid Panel Lab Results  Component Value Date   CHOL 143 06/15/2017   HDL 31 (L) 06/15/2017   LDLCALC 66 06/15/2017   LDLDIRECT 117.3 09/06/2012   TRIG 229 (H) 06/15/2017   CHOLHDL 4.6 06/15/2017     Wt Readings from Last 3 Encounters:  05/15/18 191 lb 3.2 oz (86.7 kg)  03/05/18 194 lb 7.1 oz (88.2 kg)  01/22/18 174 lb (78.9 kg)     Other studies Reviewed: Additional studies/ records that were reviewed today include:  office notes, office notes, hospital records and testing.  ASSESSMENT AND PLAN:  1.  Chronic diastolic CHF: Her weight is stable, need to follow it at the facility to get her in the habit of weighing daily so she will do this when she goes home. - continue Lasix 40 mg qd and Kdur 20 meq - No recent dose change in renal function was stable when last checked  2.  Permanent atrial fibrillation: -Her rate was  elevated today at first, but settled down.  The exertion required for her to walk from the waiting room back to the exam room with significant plus she had a coughing fit. -She tolerates a high dose of metoprolol, continue this -She is not having any bleeding issues on the Xarelto, continue this.  3.  Hypertension: -She had her medications today, but not long before arriving here.  It is likely they have not had time to kick in fully. -She is to get her blood pressure and heart rate checked daily at the facility, let us know the results and we can decide if additional medications are needed for blood pressure control   Current medicines are reviewed at length with the patient today.  The patient does not have concerns regarding medicines.  The following changes have been made:  no change  Labs/ tests ordered today include:  No orders of the defined types were placed in this encounter.    Disposition:   FU with  Kirk Ruths, MD  Signed, Rosaria Ferries, PA-C  05/15/2018 9:43 AM    [place] Phone: [phone]; Fax: [phone]

## 2018-05-15 NOTE — Patient Instructions (Signed)
Medication Instructions:  Your physician recommends that you continue on your current medications as directed. Please refer to the Current Medication list given to you today.  If you need a refill on your cardiac medications before your next appointment, please call your pharmacy.     Follow-Up: At [facility], you and your health needs are our priority.  As part of our continuing mission to provide you with exceptional heart care, we have created designated Provider Care Teams.  These Care Teams include your primary Cardiologist (physician) and Advanced Practice Providers (APPs -  Physician Assistants and Nurse Practitioners) who all work together to provide you with the care you need, when you need it. You will need a follow up appointment in 3 months.  Please call our office 2 months in advance to schedule this appointment.  You may see Kirk Ruths, MD or one of the following Advanced Practice Providers on your designated Care Team:   Kerin Ransom, PA-C Roby Lofts, [place] . Sande Rives, PA-C  Any Other Special Instructions Will Be Listed Below (If Applicable).  Your physician has requested that you regularly monitor and record your blood pressure readings at home. Please use the same machine at the same time of day to check your readings and record them to bring to your follow-up visit.  RECORD DAILY WEIGHTS- IF YOUR WEIGHT IS >3 POUNDS DAILY OR >5 POUNDS IN ONE WEEK. PLEASE CALL OUR OFFICE TO LET us KNOW [phone]. If you are not able to do this at the facility, you can start this when you get home.

## 2018-05-28 DIAGNOSIS — E559 Vitamin D deficiency, unspecified: Secondary | ICD-10-CM | POA: Diagnosis not present

## 2018-05-28 DIAGNOSIS — E039 Hypothyroidism, unspecified: Secondary | ICD-10-CM | POA: Diagnosis not present

## 2018-05-28 DIAGNOSIS — E119 Type 2 diabetes mellitus without complications: Secondary | ICD-10-CM | POA: Diagnosis not present

## 2018-05-28 DIAGNOSIS — E7800 Pure hypercholesterolemia, unspecified: Secondary | ICD-10-CM | POA: Diagnosis not present

## 2018-05-28 DIAGNOSIS — D649 Anemia, unspecified: Secondary | ICD-10-CM | POA: Diagnosis not present

## 2018-05-28 DIAGNOSIS — D518 Other vitamin B12 deficiency anemias: Secondary | ICD-10-CM | POA: Diagnosis not present

## 2018-05-28 DIAGNOSIS — Z79899 Other long term (current) drug therapy: Secondary | ICD-10-CM | POA: Diagnosis not present

## 2018-05-28 DIAGNOSIS — E782 Mixed hyperlipidemia: Secondary | ICD-10-CM | POA: Diagnosis not present

## 2018-06-18 ENCOUNTER — Telehealth: Payer: Self-pay

## 2018-06-18 ENCOUNTER — Institutional Professional Consult (permissible substitution): Payer: Medicare Other | Admitting: Neurology

## 2018-06-18 NOTE — Telephone Encounter (Signed)
I spoke with pt yesterday and advised her that [facility] is closed today. We will call her back to reschedule. Pt verbalized understanding.

## 2018-06-21 DIAGNOSIS — Z79899 Other long term (current) drug therapy: Secondary | ICD-10-CM | POA: Diagnosis not present

## 2018-06-21 DIAGNOSIS — D649 Anemia, unspecified: Secondary | ICD-10-CM | POA: Diagnosis not present

## 2018-07-19 IMAGING — CT CT HEAD W/O CM
3 series · 17 of 37 positions shown, 19 images · non-contrast
Comparison: Brain MRI and head CT 06/14/2017

CLINICAL DATA: Altered mental status.  Nausea and vomiting.

EXAM:
CT HEAD WITHOUT CONTRAST
TECHNIQUE: Contiguous axial images were obtained from the base of the skull
through the vertex without intravenous contrast.

[Series 3: head wo · axial · 0.41mm/px · z∈[-151,-46]mm · 6 of 31 slices shown, 8 images]
[im 5/31  brain]
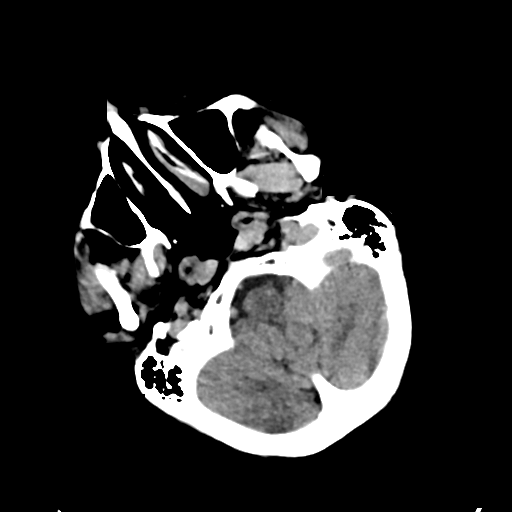
[im 5/31  bone]
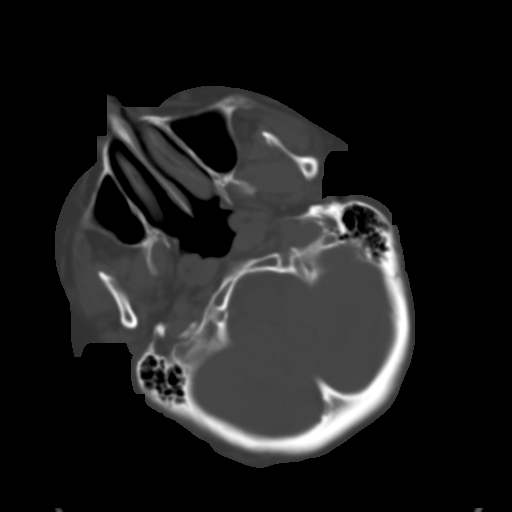
[im 9/31  brain]
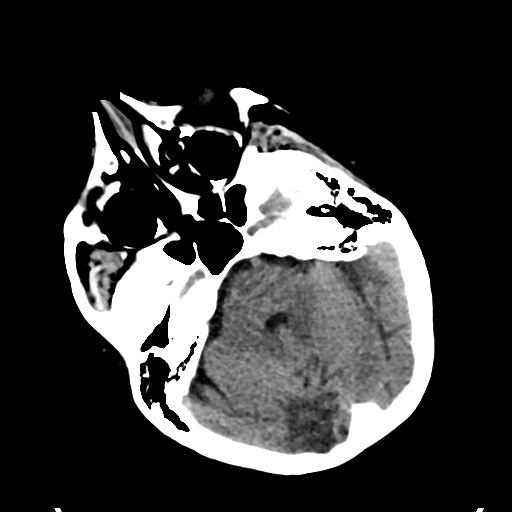
[im 13/31  brain]
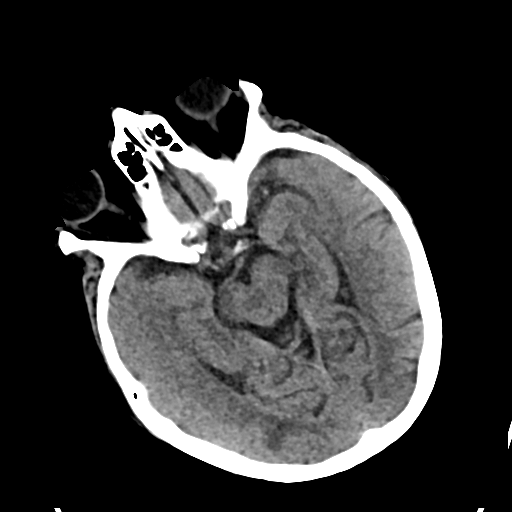
[im 18/31  brain]
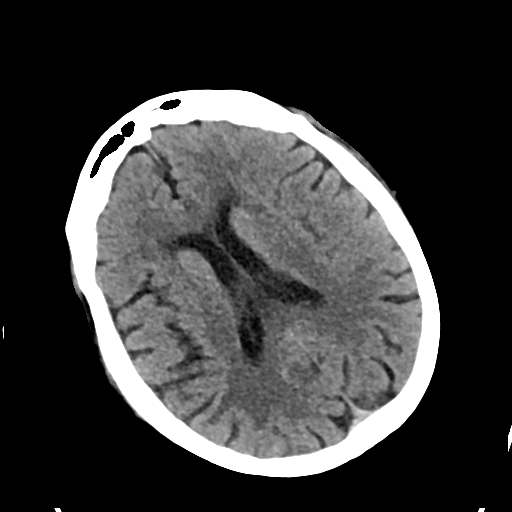
[im 22/31  brain]
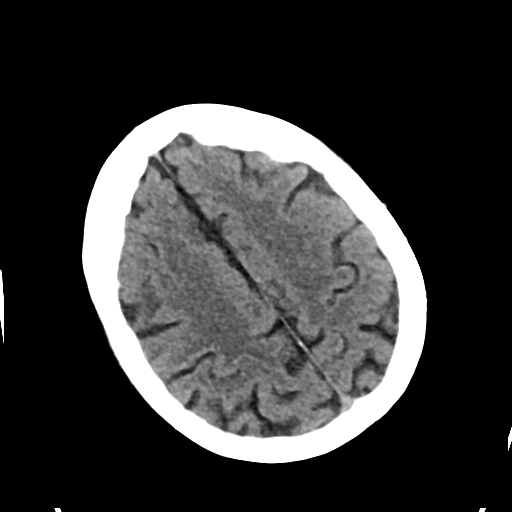
[im 22/31  bone]
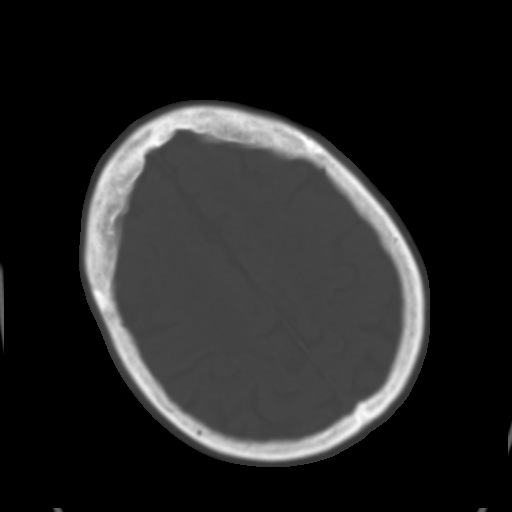
[im 26/31  brain]
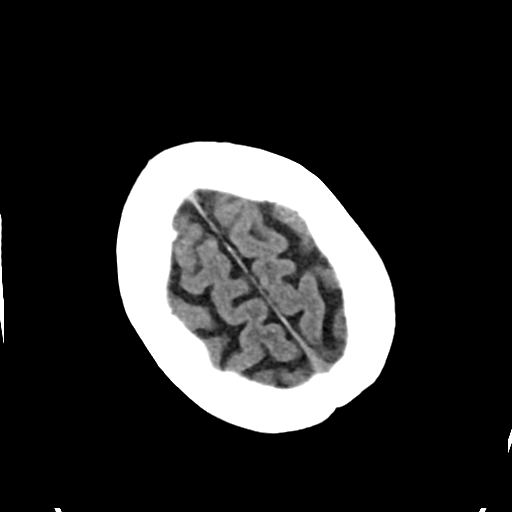

[Series 4: head bone · axial · 0.41mm/px · z∈[-157,-23]mm · 8 of 83 slices shown]
[im 8/83  bone]
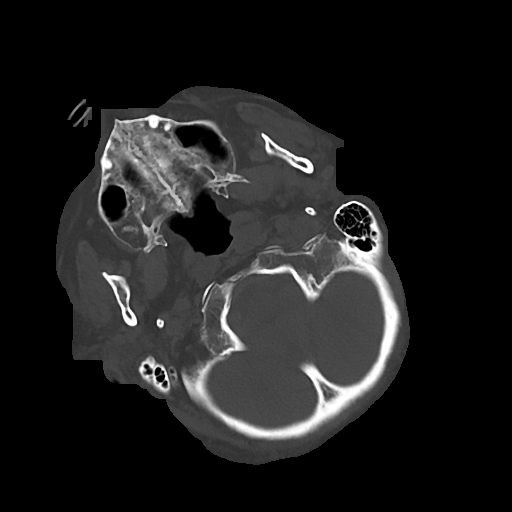
[im 16/83  bone]
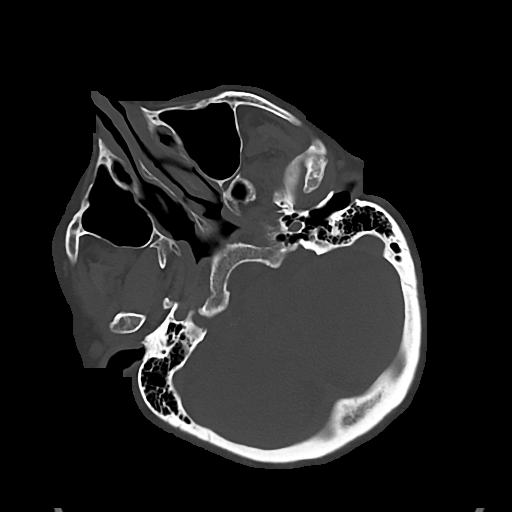
[im 28/83  bone]
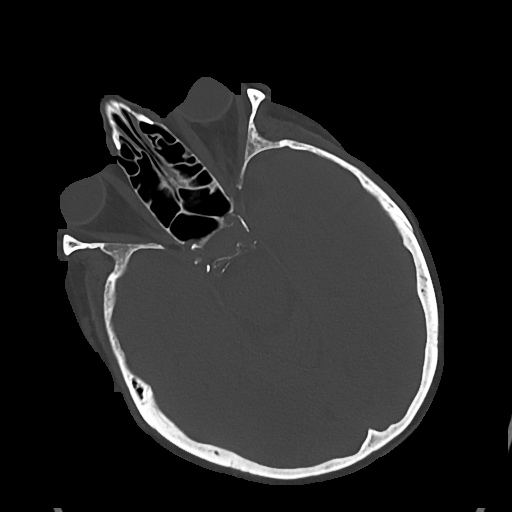
[im 36/83  bone]
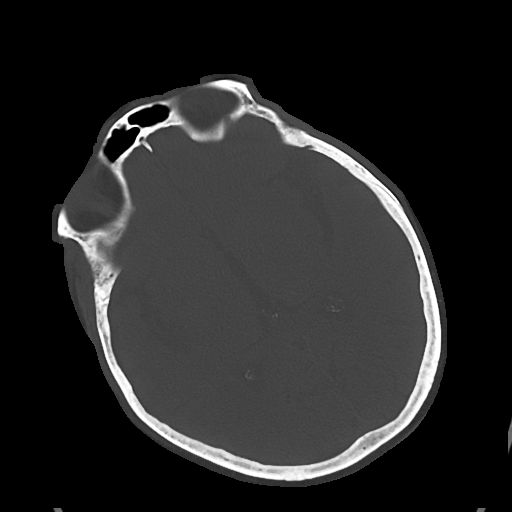
[im 47/83  bone]
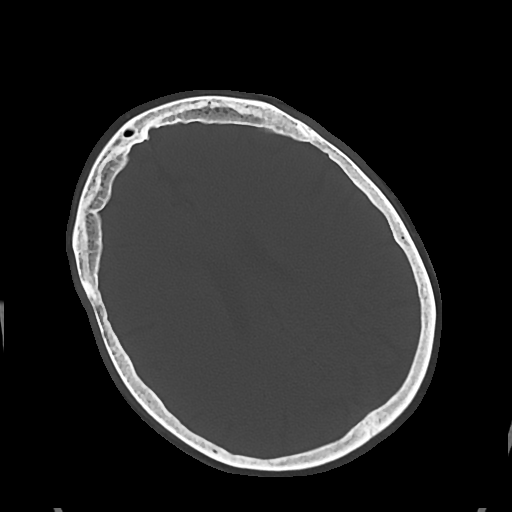
[im 55/83  bone]
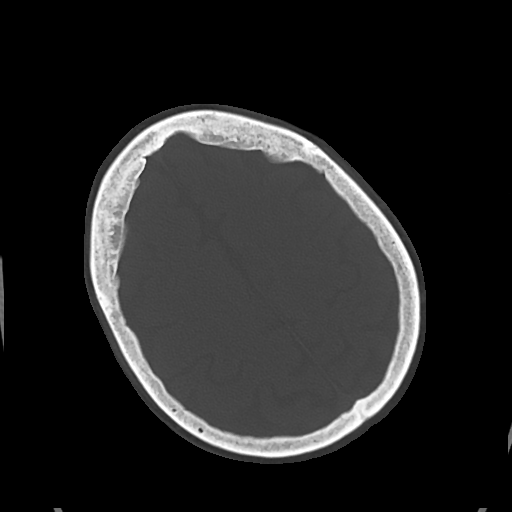
[im 67/83  bone]
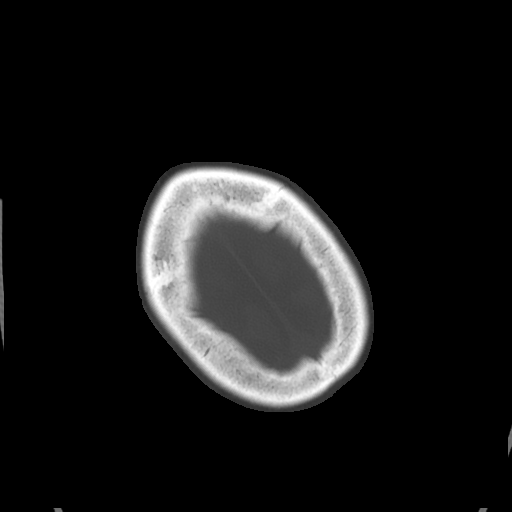
[im 75/83  bone]
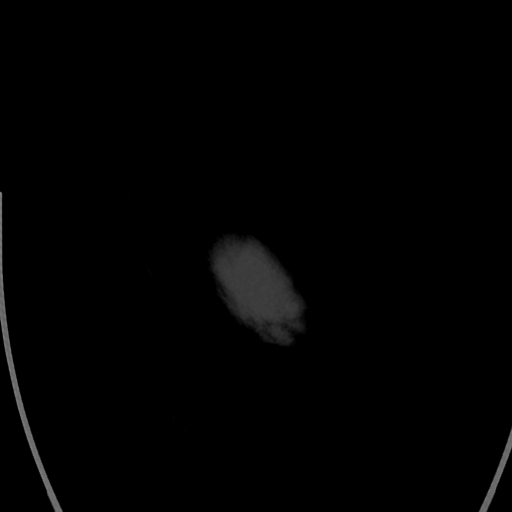

[Series 6: sag soft · sagittal · 0.32mm/px · 3 of 53 slices shown]
[im 18/53  brain]
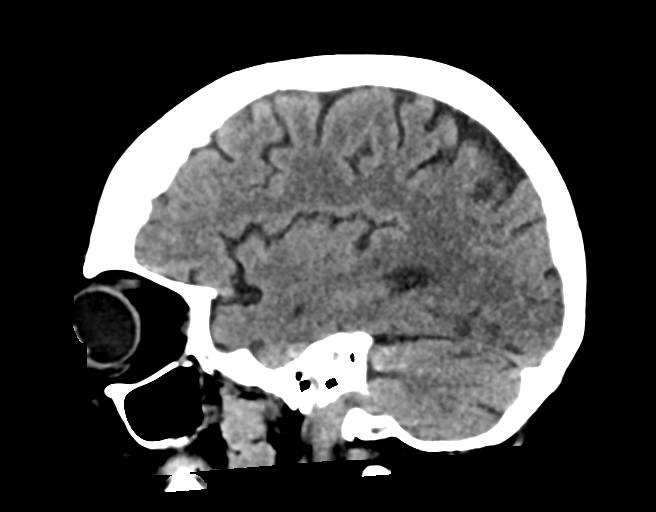
[im 27/53  brain]
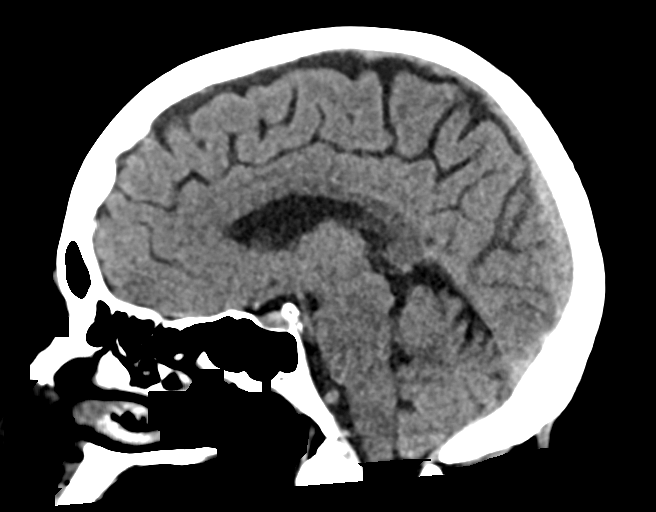
[im 35/53  brain]
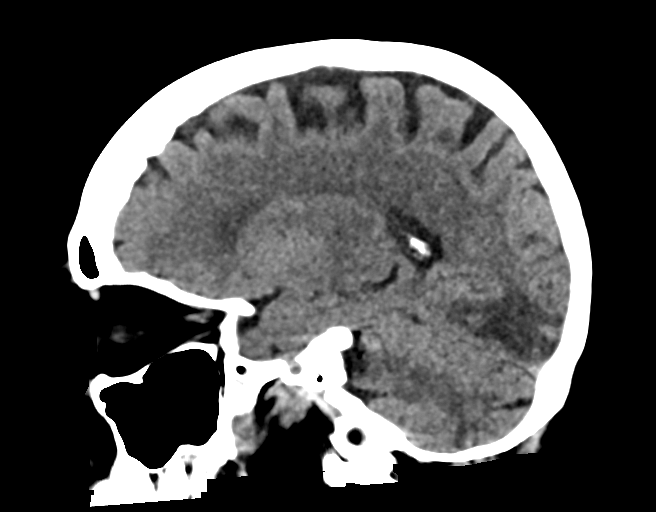

[17 of 37 positions shown; findings below may reference images not displayed]

FINDINGS: Brain: Known recent infarcts in the right occipital lobe and
temporoparietal region. No visible hemorrhagic conversion. Small
remote right inferior cerebellar infarct. No evidence of acute
infarct, hemorrhage, hydrocephalus, or mass.

Vascular: Atherosclerotic calcification.  No hyperdense vessel.

Skull: No acute or aggressive finding

Sinuses/Orbits: Bilateral cataract resection.
IMPRESSION: 1. No acute finding.
2. Known recent right cerebral infarcts.

## 2018-07-24 ENCOUNTER — Ambulatory Visit: Payer: Self-pay | Admitting: Physical Medicine & Rehabilitation

## 2018-08-02 DIAGNOSIS — Z79899 Other long term (current) drug therapy: Secondary | ICD-10-CM | POA: Diagnosis not present

## 2018-08-02 DIAGNOSIS — E782 Mixed hyperlipidemia: Secondary | ICD-10-CM | POA: Diagnosis not present

## 2018-08-02 DIAGNOSIS — N189 Chronic kidney disease, unspecified: Secondary | ICD-10-CM | POA: Diagnosis not present

## 2018-08-02 DIAGNOSIS — E039 Hypothyroidism, unspecified: Secondary | ICD-10-CM | POA: Diagnosis not present

## 2018-08-02 DIAGNOSIS — D649 Anemia, unspecified: Secondary | ICD-10-CM | POA: Diagnosis not present

## 2018-08-02 DIAGNOSIS — E559 Vitamin D deficiency, unspecified: Secondary | ICD-10-CM | POA: Diagnosis not present

## 2018-08-14 ENCOUNTER — Ambulatory Visit: Payer: Medicare Other | Admitting: Cardiology

## 2018-08-15 DIAGNOSIS — N189 Chronic kidney disease, unspecified: Secondary | ICD-10-CM | POA: Diagnosis not present

## 2018-08-15 DIAGNOSIS — Z79899 Other long term (current) drug therapy: Secondary | ICD-10-CM | POA: Diagnosis not present

## 2018-08-15 DIAGNOSIS — E039 Hypothyroidism, unspecified: Secondary | ICD-10-CM | POA: Diagnosis not present

## 2018-08-23 ENCOUNTER — Other Ambulatory Visit: Payer: Self-pay

## 2018-08-23 ENCOUNTER — Encounter: Payer: Medicare Other | Attending: Physical Medicine & Rehabilitation | Admitting: Physical Medicine & Rehabilitation

## 2018-08-23 ENCOUNTER — Encounter: Payer: Self-pay | Admitting: Physical Medicine & Rehabilitation

## 2018-08-23 NOTE — Progress Notes (Signed)
Subjective:    Patient ID: [name], female    DOB: [birth year], 73 y.o.   MRN: [id number]  HPI  Pain Inventory Average Pain 0 Pain Right Now 0 My pain is na  In the last 24 hours, has pain interfered with the following? General activity na\ Relation with others na Enjoyment of life na What TIME of day is your pain at its worst? na Sleep (in general) Fair  Pain is worse with: na Pain improves with: na Relief from Meds: na  Mobility walk with assistance use a walker ability to climb steps?  yes do you drive?  no use a wheelchair  Function disabled: date disabled .  Neuro/Psych trouble walking  Prior Studies Any changes since last visit?  no  Physicians involved in your care Any changes since last visit?  no   Family History  Problem Relation Age of Onset  . Pneumonia Mother 32  . Stroke Father 97  . Heart failure Sister   . Hypertension Sister   . Heart attack Neg Hx    Social History   Socioeconomic History  . Marital status: Divorced    Spouse name: Not on file  . Number of children: Not on file  . Years of education: Not on file  . Highest education level: Not on file  Occupational History  . Occupation: Retired  Scientific laboratory technician  . Financial resource strain: Not on file  . Food insecurity:    Worry: Not on file    Inability: Not on file  . Transportation needs:    Medical: Not on file    Non-medical: Not on file  Tobacco Use  . Smoking status: Never Smoker  . Smokeless tobacco: Never Used  Substance and Sexual Activity  . Alcohol use: No    Alcohol/week: 0.0 standard drinks  . Drug use: No  . Sexual activity: Not Currently  Lifestyle  . Physical activity:    Days per week: Not on file    Minutes per session: Not on file  . Stress: Not on file  Relationships  . Social connections:    Talks on phone: Not on file    Gets together: Not on file    Attends religious service: Not on file    Active member of club or organization: Not  on file    Attends meetings of clubs or organizations: Not on file    Relationship status: Not on file  Other Topics Concern  . Not on file  Social History Narrative  . Not on file   Past Surgical History:  Procedure Laterality Date  . CATARACT EXTRACTION Bilateral   . COLONOSCOPY WITH PROPOFOL N/A 02/20/2015   Procedure: COLONOSCOPY WITH PROPOFOL;  Surgeon: Carol Ada, MD;  [facility];  Service: Endoscopy;  Laterality: N/A;  . ENTEROSCOPY N/A 03/23/2016   Procedure: ENTEROSCOPY;  Surgeon: Carol Ada, MD;  [facility];  Service: Endoscopy;  Laterality: N/A;  . ENTEROSCOPY N/A 06/17/2016   Procedure: ENTEROSCOPY;  Surgeon: Carol Ada, MD;  [facility];  Service: Endoscopy;  Laterality: N/A;  . ESOPHAGOGASTRODUODENOSCOPY (EGD) WITH PROPOFOL N/A 02/20/2015   Procedure: ESOPHAGOGASTRODUODENOSCOPY (EGD) WITH PROPOFOL;  Surgeon: Carol Ada, MD;  [facility];  Service: Endoscopy;  Laterality: N/A;  . GIVENS CAPSULE STUDY N/A 03/21/2016   Procedure: GIVENS CAPSULE STUDY;  Surgeon: Carol Ada, MD;  [facility];  Service: Endoscopy;  Laterality: N/A;  . HAND SURGERY  1995 and 1996   artificial joints both  hands  . JOINT REPLACEMENT    . LUMBAR LAMINECTOMY/DECOMPRESSION MICRODISCECTOMY N/A 02/10/2016   Procedure: MICROLUMBAR DECOMPRESSION L4-L5 AND L3- L4, AND EXCISION OF SYNOVIAL CYST L4-L5;  Surgeon: Susa Day, MD;  [facility];  Service: Orthopedics;  Laterality: N/A;  . TOTAL HIP ARTHROPLASTY Left 2009  . TOTAL HIP ARTHROPLASTY Right 04/03/2013   Procedure: RIGHT TOTAL HIP ARTHROPLASTY ANTERIOR APPROACH;  Surgeon: Gearlean Alf, MD;  [facility];  Service: Orthopedics;  Laterality: Right;   Past Medical History:  Diagnosis Date  . Abnormal LFTs 07/24/2017  . Actinic keratosis   . Anemia    hx of  . Anemia of chronic renal failure, stage 3 (moderate) ([place]) 07/17/2015  . Anticoagulant causing adverse effect in  therapeutic use 07/17/2015  . Anxiety   . Arthritis    rheumatoid  . Atrial fibrillation ([place])    a. persistent, she remains on Xarelto  . Carpal tunnel syndrome   . Chronic diastolic (congestive) heart failure ([place])    a. 04/2016: echo showing a preserved EF of 55-60% with mild MR. LA and RA midly dilated.   . Edema    left leg at ankle resolved now  . Gastroesophageal reflux disease   . Hepatitis 1970   not sure what kind  . Hiatal hernia   . Hyperlipidemia   . Hypertension   . Iron deficiency anemia due to chronic blood loss 07/17/2015  . Jaundice    age 41  . Peripheral vascular disease ([place]) yrs ago   DVT left lower leg questionale told by 2 drs i had no clot, 1 md said i did  . Rheumatoid arthritis(714.0)   . Right knee pain   . Stroke ([place])   . Type II diabetes mellitus ([facility])    type2   Ht 5\' 7"  (1.702 m)   Wt 180 lb (81.6 kg)   BMI 28.19 kg/m   Opioid Risk Score:   Fall Risk Score:  `1  Depression screen PHQ 2/9  Depression screen PHQ 2/9 10/23/2017  Decreased Interest 0  Down, Depressed, Hopeless 0  PHQ - 2 Score 0  Some recent data might be hidden     Review of Systems  Constitutional: Negative.   HENT: Negative.   Eyes: Negative.   Respiratory: Negative.   Cardiovascular: Negative.   Gastrointestinal: Negative.   Endocrine: Negative.   Genitourinary: Negative.   Musculoskeletal: Positive for gait problem.  Skin: Negative.   Allergic/Immunologic: Negative.   Hematological: Negative.   Psychiatric/Behavioral: Negative.   All other systems reviewed and are negative.      Objective:   Physical Exam        Assessment & Plan:

## 2018-08-24 ENCOUNTER — Encounter: Payer: Self-pay | Admitting: Physical Medicine & Rehabilitation

## 2018-08-24 ENCOUNTER — Other Ambulatory Visit: Payer: Self-pay

## 2018-08-24 ENCOUNTER — Encounter (HOSPITAL_BASED_OUTPATIENT_CLINIC_OR_DEPARTMENT_OTHER): Payer: Medicare Other | Admitting: Physical Medicine & Rehabilitation

## 2018-08-24 VITALS — Ht 67.0 in | Wt 180.0 lb

## 2018-08-24 DIAGNOSIS — Z8673 Personal history of transient ischemic attack (TIA), and cerebral infarction without residual deficits: Secondary | ICD-10-CM | POA: Diagnosis not present

## 2018-08-24 DIAGNOSIS — G5721 Lesion of femoral nerve, right lower limb: Secondary | ICD-10-CM

## 2018-08-24 DIAGNOSIS — R269 Unspecified abnormalities of gait and mobility: Secondary | ICD-10-CM

## 2018-08-24 DIAGNOSIS — I69398 Other sequelae of cerebral infarction: Secondary | ICD-10-CM

## 2018-08-24 NOTE — Progress Notes (Signed)
Subjective:  Consent for phone visit   Patient ID: [name], female    DOB: Jun 20, 1945, 73 y.o.   MRN: [id number] 73 y.o. female with history of Afib, diabetes and RA who presented on 06/14/17 with headache and blurred vision after having stopped xarelto on 06/12/17 for nerve block.  MRI revealed acute infarcts of the posterior right middle cerebral artery territory and the right posterior cerebral artery territory with petechial hemorrhage in the distribution of the right PCA infarct. Dr. Erlinda Hong felt that stroke embolic due to known A Fib--on  325mg  ECASA daily up due to size of stroke. To resume Xarelto in 7 days. Patient with ongoing left field cut with deficits in visual-spatial awareness and coordination, left inattention, significant cognitive deficits (MoCA 10/30) with lack of awareness of deficits and unsteady gait  HPI Pt feels isolated , quarantined in room At [facility] , no coronavirus cases Pt can ambulate with walker and uses a weight bar to exercise.  Has lost 10lb in the last 2 month  Mod I with bathroom, except needs sup for shower transfers  RIght leg/thigh strength  "much better" strength but still with knee arthritis pain  Pain Inventory Average Pain 0 Pain Right Now 0 My pain is na  In the last 24 hours, has pain interfered with the following? General activity 0 Relation with others 0 Enjoyment of life 0 What TIME of day is your pain at its worst? na Sleep (in general) Fair  Pain is worse with: na Pain improves with: na Relief from Meds: na  Mobility walk without assistance use a walker ability to climb steps?  yes do you drive?  no use a wheelchair  Function disabled: date disabled .  Neuro/Psych No problems in this area  Prior Studies Any changes since last visit?  no  Physicians involved in your care Any changes since last visit?  no   Family History  Problem Relation Age of Onset  . Pneumonia Mother 9  . Stroke Father 82   . Heart failure Sister   . Hypertension Sister   . Heart attack Neg Hx    Social History   Socioeconomic History  . Marital status: Divorced    Spouse name: Not on file  . Number of children: Not on file  . Years of education: Not on file  . Highest education level: Not on file  Occupational History  . Occupation: Retired  Scientific laboratory technician  . Financial resource strain: Not on file  . Food insecurity:    Worry: Not on file    Inability: Not on file  . Transportation needs:    Medical: Not on file    Non-medical: Not on file  Tobacco Use  . Smoking status: Never Smoker  . Smokeless tobacco: Never Used  Substance and Sexual Activity  . Alcohol use: No    Alcohol/week: 0.0 standard drinks  . Drug use: No  . Sexual activity: Not Currently  Lifestyle  . Physical activity:    Days per week: Not on file    Minutes per session: Not on file  . Stress: Not on file  Relationships  . Social connections:    Talks on phone: Not on file    Gets together: Not on file    Attends religious service: Not on file    Active member of club or organization: Not on file    Attends meetings of clubs or organizations: Not on file    Relationship  status: Not on file  Other Topics Concern  . Not on file  Social History Narrative  . Not on file   Past Surgical History:  Procedure Laterality Date  . CATARACT EXTRACTION Bilateral   . COLONOSCOPY WITH PROPOFOL N/A 02/20/2015   Procedure: COLONOSCOPY WITH PROPOFOL;  Surgeon: Carol Ada, MD;  Location: [facility];  Service: Endoscopy;  Laterality: N/A;  . ENTEROSCOPY N/A 03/23/2016   Procedure: ENTEROSCOPY;  Surgeon: Carol Ada, MD;  Location: [facility];  Service: Endoscopy;  Laterality: N/A;  . ENTEROSCOPY N/A 06/17/2016   Procedure: ENTEROSCOPY;  Surgeon: Carol Ada, MD;  Location: [facility];  Service: Endoscopy;  Laterality: N/A;  . ESOPHAGOGASTRODUODENOSCOPY (EGD) WITH PROPOFOL N/A 02/20/2015   Procedure:  ESOPHAGOGASTRODUODENOSCOPY (EGD) WITH PROPOFOL;  Surgeon: Carol Ada, MD;  Location: [facility];  Service: Endoscopy;  Laterality: N/A;  . GIVENS CAPSULE STUDY N/A 03/21/2016   Procedure: GIVENS CAPSULE STUDY;  Surgeon: Carol Ada, MD;  Location: [facility];  Service: Endoscopy;  Laterality: N/A;  . HAND SURGERY  1995 and 1996   artificial joints both hands  . JOINT REPLACEMENT    . LUMBAR LAMINECTOMY/DECOMPRESSION MICRODISCECTOMY N/A 02/10/2016   Procedure: MICROLUMBAR DECOMPRESSION L4-L5 AND L3- L4, AND EXCISION OF SYNOVIAL CYST L4-L5;  Surgeon: Susa Day, MD;  Location: [facility];  Service: Orthopedics;  Laterality: N/A;  . TOTAL HIP ARTHROPLASTY Left 2009  . TOTAL HIP ARTHROPLASTY Right 04/03/2013   Procedure: RIGHT TOTAL HIP ARTHROPLASTY ANTERIOR APPROACH;  Surgeon: Gearlean Alf, MD;  Location: [facility];  Service: Orthopedics;  Laterality: Right;   Past Medical History:  Diagnosis Date  . Abnormal LFTs 07/24/2017  . Actinic keratosis   . Anemia    hx of  . Anemia of chronic renal failure, stage 3 (moderate) ([place]) 07/17/2015  . Anticoagulant causing adverse effect in therapeutic use 07/17/2015  . Anxiety   . Arthritis    rheumatoid  . Atrial fibrillation ([place])    a. persistent, she remains on Xarelto  . Carpal tunnel syndrome   . Chronic diastolic (congestive) heart failure ([place])    a. 04/2016: echo showing a preserved EF of 55-60% with mild MR. LA and RA midly dilated.   . Edema    left leg at ankle resolved now  . Gastroesophageal reflux disease   . Hepatitis 1970   not sure what kind  . Hiatal hernia   . Hyperlipidemia   . Hypertension   . Iron deficiency anemia due to chronic blood loss 07/17/2015  . Jaundice    age 59  . Peripheral vascular disease ([place]) yrs ago   DVT left lower leg questionale told by 2 drs i had no clot, 1 md said i did  . Rheumatoid arthritis(714.0)   . Right knee pain   . Stroke ([place])   . Type II diabetes mellitus ([place])    type2   There  were no vitals taken for this visit.  Opioid Risk Score:   Fall Risk Score:  `1  Depression screen PHQ 2/9  Depression screen PHQ 2/9 10/23/2017  Decreased Interest 0  Down, Depressed, Hopeless 0  PHQ - 2 Score 0  Some recent data might be hidden     Review of Systems  Constitutional: Negative.   HENT: Negative.   Eyes: Negative.   Respiratory: Negative.   Cardiovascular: Negative.   Gastrointestinal: Negative.   Endocrine: Negative.   Genitourinary: Negative.   Musculoskeletal: Positive for gait problem.  Skin: Negative.   Allergic/Immunologic: Negative.  Hematological: Negative.   Psychiatric/Behavioral: Negative.   All other systems reviewed and are negative.      Objective:   Physical Exam Vitals signs and nursing note reviewed.  Neurological:     Mental Status: She is alert and oriented to person, place, and time. Mental status is at baseline.  Psychiatric:        Mood and Affect: Mood normal.    Motor strength is not tested secondary to phone visit remainder of neurologic exam is deferred Is oriented She has been appropriate and asking for fax numbers to send vitals and medication information.       Assessment & Plan:  1.  History of right MCA and PCA infarcts approximately 14 months ago.  She has residual gait disorder.  Cognition has been improving.  Overall mobility has improved to modified independent level.  She still needs some intermittent supervision for ADLs. At this point I do not think she needs any further physical medicine rehabilitation follow-up.  She can continue with her home exercise program.  Weight loss has been encouraged. Follow-up with PCP as well as rheumatologist and neurologist.  Duration of visit 67min

## 2018-08-29 DIAGNOSIS — E119 Type 2 diabetes mellitus without complications: Secondary | ICD-10-CM | POA: Diagnosis not present

## 2018-09-03 DIAGNOSIS — Z20828 Contact with and (suspected) exposure to other viral communicable diseases: Secondary | ICD-10-CM | POA: Diagnosis not present

## 2018-09-03 DIAGNOSIS — Z1383 Encounter for screening for respiratory disorder NEC: Secondary | ICD-10-CM | POA: Diagnosis not present

## 2018-09-28 NOTE — Progress Notes (Signed)
This encounter was created in error - please disregard.

## 2018-11-30 DIAGNOSIS — Z7984 Long term (current) use of oral hypoglycemic drugs: Secondary | ICD-10-CM | POA: Diagnosis not present

## 2018-11-30 DIAGNOSIS — E261 Secondary hyperaldosteronism: Secondary | ICD-10-CM | POA: Diagnosis not present

## 2018-11-30 DIAGNOSIS — D518 Other vitamin B12 deficiency anemias: Secondary | ICD-10-CM | POA: Diagnosis not present

## 2018-11-30 DIAGNOSIS — E559 Vitamin D deficiency, unspecified: Secondary | ICD-10-CM | POA: Diagnosis not present

## 2018-11-30 DIAGNOSIS — I63411 Cerebral infarction due to embolism of right middle cerebral artery: Secondary | ICD-10-CM | POA: Diagnosis not present

## 2018-11-30 DIAGNOSIS — E119 Type 2 diabetes mellitus without complications: Secondary | ICD-10-CM | POA: Diagnosis not present

## 2018-11-30 DIAGNOSIS — E568 Deficiency of other vitamins: Secondary | ICD-10-CM | POA: Diagnosis not present

## 2018-12-04 DIAGNOSIS — D649 Anemia, unspecified: Secondary | ICD-10-CM | POA: Diagnosis not present

## 2018-12-07 DIAGNOSIS — U071 COVID-19: Secondary | ICD-10-CM | POA: Diagnosis not present

## 2018-12-11 DIAGNOSIS — R0902 Hypoxemia: Secondary | ICD-10-CM | POA: Diagnosis not present

## 2018-12-11 DIAGNOSIS — R410 Disorientation, unspecified: Secondary | ICD-10-CM | POA: Diagnosis not present

## 2018-12-11 DIAGNOSIS — I499 Cardiac arrhythmia, unspecified: Secondary | ICD-10-CM | POA: Diagnosis not present

## 2018-12-11 DIAGNOSIS — I4891 Unspecified atrial fibrillation: Secondary | ICD-10-CM | POA: Diagnosis not present

## 2018-12-11 DIAGNOSIS — R4182 Altered mental status, unspecified: Secondary | ICD-10-CM | POA: Diagnosis not present

## 2018-12-25 DIAGNOSIS — N189 Chronic kidney disease, unspecified: Secondary | ICD-10-CM | POA: Diagnosis not present

## 2018-12-25 DIAGNOSIS — D649 Anemia, unspecified: Secondary | ICD-10-CM | POA: Diagnosis not present

## 2018-12-26 DIAGNOSIS — N390 Urinary tract infection, site not specified: Secondary | ICD-10-CM | POA: Diagnosis not present

## 2019-02-11 DIAGNOSIS — E1169 Type 2 diabetes mellitus with other specified complication: Secondary | ICD-10-CM | POA: Diagnosis not present

## 2019-02-11 DIAGNOSIS — N1831 Chronic kidney disease, stage 3a: Secondary | ICD-10-CM | POA: Diagnosis not present

## 2019-03-04 DIAGNOSIS — E1169 Type 2 diabetes mellitus with other specified complication: Secondary | ICD-10-CM | POA: Diagnosis not present

## 2019-06-08 ENCOUNTER — Other Ambulatory Visit: Payer: Self-pay | Admitting: Nurse Practitioner

## 2020-03-30 ENCOUNTER — Inpatient Hospital Stay (HOSPITAL_COMMUNITY): Payer: Medicare Other

## 2020-03-30 ENCOUNTER — Emergency Department (HOSPITAL_COMMUNITY): Payer: Medicare Other

## 2020-03-30 ENCOUNTER — Inpatient Hospital Stay: Payer: Self-pay

## 2020-03-30 ENCOUNTER — Encounter (HOSPITAL_COMMUNITY): Payer: Self-pay | Admitting: Emergency Medicine

## 2020-03-30 ENCOUNTER — Inpatient Hospital Stay (HOSPITAL_COMMUNITY)
Admission: EM | Admit: 2020-03-30 | Discharge: 2020-04-03 | DRG: 871 | Disposition: A | Discharge status: Skilled Nursing Facility | Payer: Medicare Other | Source: Skilled Nursing Facility | Attending: Family Medicine | Admitting: Family Medicine

## 2020-03-30 ENCOUNTER — Other Ambulatory Visit: Payer: Self-pay

## 2020-03-30 DIAGNOSIS — Z888 Allergy status to other drugs, medicaments and biological substances status: Secondary | ICD-10-CM

## 2020-03-30 DIAGNOSIS — I5032 Chronic diastolic (congestive) heart failure: Secondary | ICD-10-CM | POA: Diagnosis not present

## 2020-03-30 DIAGNOSIS — E876 Hypokalemia: Secondary | ICD-10-CM | POA: Diagnosis present

## 2020-03-30 DIAGNOSIS — G9341 Metabolic encephalopathy: Secondary | ICD-10-CM | POA: Diagnosis present

## 2020-03-30 DIAGNOSIS — D631 Anemia in chronic kidney disease: Secondary | ICD-10-CM | POA: Diagnosis present

## 2020-03-30 DIAGNOSIS — I5033 Acute on chronic diastolic (congestive) heart failure: Secondary | ICD-10-CM

## 2020-03-30 DIAGNOSIS — L03116 Cellulitis of left lower limb: Secondary | ICD-10-CM | POA: Diagnosis present

## 2020-03-30 DIAGNOSIS — Z20822 Contact with and (suspected) exposure to covid-19: Secondary | ICD-10-CM | POA: Diagnosis present

## 2020-03-30 DIAGNOSIS — I10 Essential (primary) hypertension: Secondary | ICD-10-CM | POA: Diagnosis present

## 2020-03-30 DIAGNOSIS — A401 Sepsis due to streptococcus, group B: Principal | ICD-10-CM | POA: Diagnosis present

## 2020-03-30 DIAGNOSIS — K449 Diaphragmatic hernia without obstruction or gangrene: Secondary | ICD-10-CM | POA: Diagnosis present

## 2020-03-30 DIAGNOSIS — E1142 Type 2 diabetes mellitus with diabetic polyneuropathy: Secondary | ICD-10-CM | POA: Diagnosis present

## 2020-03-30 DIAGNOSIS — I4891 Unspecified atrial fibrillation: Secondary | ICD-10-CM

## 2020-03-30 DIAGNOSIS — F0390 Unspecified dementia without behavioral disturbance: Secondary | ICD-10-CM | POA: Diagnosis present

## 2020-03-30 DIAGNOSIS — Z7901 Long term (current) use of anticoagulants: Secondary | ICD-10-CM

## 2020-03-30 DIAGNOSIS — N182 Chronic kidney disease, stage 2 (mild): Secondary | ICD-10-CM | POA: Diagnosis present

## 2020-03-30 DIAGNOSIS — A419 Sepsis, unspecified organism: Secondary | ICD-10-CM

## 2020-03-30 DIAGNOSIS — E669 Obesity, unspecified: Secondary | ICD-10-CM | POA: Diagnosis present

## 2020-03-30 DIAGNOSIS — Z515 Encounter for palliative care: Secondary | ICD-10-CM

## 2020-03-30 DIAGNOSIS — Z8673 Personal history of transient ischemic attack (TIA), and cerebral infarction without residual deficits: Secondary | ICD-10-CM

## 2020-03-30 DIAGNOSIS — F32A Depression, unspecified: Secondary | ICD-10-CM | POA: Diagnosis present

## 2020-03-30 DIAGNOSIS — E1122 Type 2 diabetes mellitus with diabetic chronic kidney disease: Secondary | ICD-10-CM | POA: Diagnosis present

## 2020-03-30 DIAGNOSIS — Z79899 Other long term (current) drug therapy: Secondary | ICD-10-CM

## 2020-03-30 DIAGNOSIS — Z96643 Presence of artificial hip joint, bilateral: Secondary | ICD-10-CM | POA: Diagnosis present

## 2020-03-30 DIAGNOSIS — E7800 Pure hypercholesterolemia, unspecified: Secondary | ICD-10-CM | POA: Diagnosis present

## 2020-03-30 DIAGNOSIS — J189 Pneumonia, unspecified organism: Secondary | ICD-10-CM | POA: Diagnosis present

## 2020-03-30 DIAGNOSIS — Z7189 Other specified counseling: Secondary | ICD-10-CM

## 2020-03-30 DIAGNOSIS — Z66 Do not resuscitate: Secondary | ICD-10-CM | POA: Diagnosis present

## 2020-03-30 DIAGNOSIS — R609 Edema, unspecified: Secondary | ICD-10-CM

## 2020-03-30 DIAGNOSIS — L539 Erythematous condition, unspecified: Secondary | ICD-10-CM

## 2020-03-30 DIAGNOSIS — J440 Chronic obstructive pulmonary disease with acute lower respiratory infection: Secondary | ICD-10-CM | POA: Diagnosis present

## 2020-03-30 DIAGNOSIS — E1151 Type 2 diabetes mellitus with diabetic peripheral angiopathy without gangrene: Secondary | ICD-10-CM | POA: Diagnosis present

## 2020-03-30 DIAGNOSIS — R509 Fever, unspecified: Secondary | ICD-10-CM

## 2020-03-30 DIAGNOSIS — M069 Rheumatoid arthritis, unspecified: Secondary | ICD-10-CM | POA: Diagnosis present

## 2020-03-30 DIAGNOSIS — Z823 Family history of stroke: Secondary | ICD-10-CM

## 2020-03-30 DIAGNOSIS — K219 Gastro-esophageal reflux disease without esophagitis: Secondary | ICD-10-CM | POA: Diagnosis present

## 2020-03-30 DIAGNOSIS — I130 Hypertensive heart and chronic kidney disease with heart failure and stage 1 through stage 4 chronic kidney disease, or unspecified chronic kidney disease: Secondary | ICD-10-CM | POA: Diagnosis present

## 2020-03-30 DIAGNOSIS — R54 Age-related physical debility: Secondary | ICD-10-CM | POA: Diagnosis present

## 2020-03-30 DIAGNOSIS — E785 Hyperlipidemia, unspecified: Secondary | ICD-10-CM | POA: Diagnosis present

## 2020-03-30 DIAGNOSIS — L0390 Cellulitis, unspecified: Secondary | ICD-10-CM

## 2020-03-30 DIAGNOSIS — I4821 Permanent atrial fibrillation: Secondary | ICD-10-CM | POA: Diagnosis present

## 2020-03-30 DIAGNOSIS — R6520 Severe sepsis without septic shock: Secondary | ICD-10-CM | POA: Diagnosis present

## 2020-03-30 DIAGNOSIS — Z7952 Long term (current) use of systemic steroids: Secondary | ICD-10-CM

## 2020-03-30 DIAGNOSIS — F419 Anxiety disorder, unspecified: Secondary | ICD-10-CM | POA: Diagnosis present

## 2020-03-30 DIAGNOSIS — R400 Somnolence: Secondary | ICD-10-CM | POA: Diagnosis present

## 2020-03-30 DIAGNOSIS — I272 Pulmonary hypertension, unspecified: Secondary | ICD-10-CM | POA: Diagnosis present

## 2020-03-30 DIAGNOSIS — Z96693 Finger-joint replacement, bilateral: Secondary | ICD-10-CM | POA: Diagnosis present

## 2020-03-30 DIAGNOSIS — E119 Type 2 diabetes mellitus without complications: Secondary | ICD-10-CM

## 2020-03-30 DIAGNOSIS — K6812 Psoas muscle abscess: Secondary | ICD-10-CM | POA: Diagnosis present

## 2020-03-30 DIAGNOSIS — Z8249 Family history of ischemic heart disease and other diseases of the circulatory system: Secondary | ICD-10-CM

## 2020-03-30 DIAGNOSIS — R7881 Bacteremia: Secondary | ICD-10-CM | POA: Diagnosis not present

## 2020-03-30 DIAGNOSIS — Z6828 Body mass index (BMI) 28.0-28.9, adult: Secondary | ICD-10-CM

## 2020-03-30 DIAGNOSIS — E1169 Type 2 diabetes mellitus with other specified complication: Secondary | ICD-10-CM

## 2020-03-30 DIAGNOSIS — I361 Nonrheumatic tricuspid (valve) insufficiency: Secondary | ICD-10-CM | POA: Diagnosis not present

## 2020-03-30 DIAGNOSIS — I340 Nonrheumatic mitral (valve) insufficiency: Secondary | ICD-10-CM | POA: Diagnosis not present

## 2020-03-30 DIAGNOSIS — Z7984 Long term (current) use of oral hypoglycemic drugs: Secondary | ICD-10-CM

## 2020-03-30 LAB — TROPONIN I (HIGH SENSITIVITY)
Troponin I (High Sensitivity): 6 ng/L (ref <18)
Troponin I (High Sensitivity): 7 ng/L (ref <18)

## 2020-03-30 LAB — COMPREHENSIVE METABOLIC PANEL
ALT: 12 U/L (ref 0–44)
AST: 18 U/L (ref 15–41)
Albumin: 3.6 g/dL (ref 3.5–5.0)
Alkaline Phosphatase: 90 U/L (ref 38–126)
Anion gap: 15 (ref 5–15)
BUN: 23 mg/dL (ref 8–23)
CO2: 23 mmol/L (ref 22–32)
Calcium: 9.5 mg/dL (ref 8.9–10.3)
Chloride: 100 mmol/L (ref 98–111)
Creatinine, Ser: 1.05 mg/dL — ABNORMAL HIGH (ref 0.44–1.00)
GFR, Estimated: 55 mL/min — ABNORMAL LOW (ref >60)
Glucose, Bld: 133 mg/dL — ABNORMAL HIGH (ref 70–99)
Potassium: 3.8 mmol/L (ref 3.5–5.1)
Sodium: 138 mmol/L (ref 135–145)
Total Bilirubin: 0.8 mg/dL (ref 0.3–1.2)
Total Protein: 7.5 g/dL (ref 6.5–8.1)

## 2020-03-30 LAB — BLOOD CULTURE ID PANEL (REFLEXED) - BCID2

## 2020-03-30 LAB — CBC WITH DIFFERENTIAL/PLATELET
Abs Immature Granulocytes: 0.17 10*3/uL — ABNORMAL HIGH (ref 0.00–0.07)
Basophils Absolute: 0.1 10*3/uL (ref 0.0–0.1)
Basophils Relative: 0 %
Eosinophils Absolute: 0.1 10*3/uL (ref 0.0–0.5)
Eosinophils Relative: 1 %
HCT: 40.9 % (ref 36.0–46.0)
Hemoglobin: 12.5 g/dL (ref 12.0–15.0)
Immature Granulocytes: 1 %
Lymphocytes Relative: 6 %
Lymphs Abs: 1.3 10*3/uL (ref 0.7–4.0)
MCH: 26.4 pg (ref 26.0–34.0)
MCHC: 30.6 g/dL (ref 30.0–36.0)
MCV: 86.5 fL (ref 80.0–100.0)
Monocytes Absolute: 1 10*3/uL (ref 0.1–1.0)
Monocytes Relative: 5 %
Neutro Abs: 18.4 10*3/uL — ABNORMAL HIGH (ref 1.7–7.7)
Neutrophils Relative %: 87 %
Platelets: 358 10*3/uL (ref 150–400)
RBC: 4.73 MIL/uL (ref 3.87–5.11)
RDW: 17.3 % — ABNORMAL HIGH (ref 11.5–15.5)
WBC: 21 10*3/uL — ABNORMAL HIGH (ref 4.0–10.5)
nRBC: 0 % (ref 0.0–0.2)

## 2020-03-30 LAB — RESP PANEL BY RT-PCR (FLU A&B, COVID) ARPGX2
Influenza A by PCR: NEGATIVE
Influenza B by PCR: NEGATIVE
SARS Coronavirus 2 by RT PCR: NEGATIVE

## 2020-03-30 LAB — BLOOD GAS, VENOUS
Acid-Base Excess: 2 mmol/L (ref 0.0–2.0)
Bicarbonate: 26.3 mmol/L (ref 20.0–28.0)
FIO2: 28
O2 Saturation: 90.3 %
Patient temperature: 37
pCO2, Ven: 35.1 mmHg — ABNORMAL LOW (ref 44.0–60.0)
pH, Ven: 7.472 — ABNORMAL HIGH (ref 7.250–7.430)
pO2, Ven: 65.3 mmHg — ABNORMAL HIGH (ref 32.0–45.0)

## 2020-03-30 LAB — CBC
HCT: 36.6 % (ref 36.0–46.0)
Hemoglobin: 10.6 g/dL — ABNORMAL LOW (ref 12.0–15.0)
MCH: 26 pg (ref 26.0–34.0)
MCHC: 29 g/dL — ABNORMAL LOW (ref 30.0–36.0)
MCV: 89.7 fL (ref 80.0–100.0)
Platelets: 294 10*3/uL (ref 150–400)
RBC: 4.08 MIL/uL (ref 3.87–5.11)
RDW: 17.6 % — ABNORMAL HIGH (ref 11.5–15.5)
WBC: 32.6 10*3/uL — ABNORMAL HIGH (ref 4.0–10.5)
nRBC: 0 % (ref 0.0–0.2)

## 2020-03-30 LAB — MAGNESIUM: Magnesium: 1.4 mg/dL — ABNORMAL LOW (ref 1.7–2.4)

## 2020-03-30 LAB — URINALYSIS, ROUTINE W REFLEX MICROSCOPIC
Bilirubin Urine: NEGATIVE
Glucose, UA: NEGATIVE mg/dL
Hgb urine dipstick: NEGATIVE
Ketones, ur: NEGATIVE mg/dL
Leukocytes,Ua: NEGATIVE
Nitrite: NEGATIVE
Protein, ur: 30 mg/dL — AB
Specific Gravity, Urine: 1.02 (ref 1.005–1.030)
pH: 5 (ref 5.0–8.0)

## 2020-03-30 LAB — BASIC METABOLIC PANEL
Anion gap: 13 (ref 5–15)
BUN: 21 mg/dL (ref 8–23)
CO2: 23 mmol/L (ref 22–32)
Calcium: 8.6 mg/dL — ABNORMAL LOW (ref 8.9–10.3)
Chloride: 100 mmol/L (ref 98–111)
Creatinine, Ser: 1.03 mg/dL — ABNORMAL HIGH (ref 0.44–1.00)
GFR, Estimated: 57 mL/min — ABNORMAL LOW (ref >60)
Glucose, Bld: 163 mg/dL — ABNORMAL HIGH (ref 70–99)
Potassium: 3.7 mmol/L (ref 3.5–5.1)
Sodium: 136 mmol/L (ref 135–145)

## 2020-03-30 LAB — LACTIC ACID, PLASMA
Lactic Acid, Venous: 2.9 mmol/L (ref 0.5–1.9)
Lactic Acid, Venous: 3.8 mmol/L (ref 0.5–1.9)
Lactic Acid, Venous: 2.7 mmol/L (ref 0.5–1.9)
Lactic Acid, Venous: 3 mmol/L (ref 0.5–1.9)

## 2020-03-30 LAB — PHOSPHORUS: Phosphorus: 2.4 mg/dL — ABNORMAL LOW (ref 2.5–4.6)

## 2020-03-30 LAB — PROTIME-INR
INR: 1.5 — ABNORMAL HIGH (ref 0.8–1.2)
Prothrombin Time: 17.6 seconds — ABNORMAL HIGH (ref 11.4–15.2)

## 2020-03-30 LAB — APTT: aPTT: 30 seconds (ref 24–36)

## 2020-03-30 MED ORDER — MORPHINE SULFATE (PF) 2 MG/ML IV SOLN
1.0000 mg | INTRAVENOUS | Status: DC | PRN
Start: 2020-03-30 — End: 2020-04-02
  Administered 2020-03-30 – 2020-03-31 (×4): 1 mg via INTRAVENOUS
  Filled 2020-03-30 (×4): qty 1

## 2020-03-30 MED ORDER — ONDANSETRON HCL 4 MG/2ML IJ SOLN: 4.0000 mg | Freq: Four times a day (QID) | INTRAMUSCULAR | Status: DC | PRN

## 2020-03-30 MED ORDER — PANTOPRAZOLE SODIUM 40 MG IV SOLR
40.0000 mg | INTRAVENOUS | Status: DC
  Administered 2020-03-30: 08:00:00 40 mg via INTRAVENOUS
  Filled 2020-03-30: qty 40

## 2020-03-30 MED ORDER — HALOPERIDOL LACTATE 5 MG/ML IJ SOLN
0.5000 mg | INTRAMUSCULAR | Status: DC | PRN
  Administered 2020-03-31: 06:00:00 0.5 mg via INTRAVENOUS
  Filled 2020-03-30: qty 1

## 2020-03-30 MED ORDER — KETOROLAC TROMETHAMINE 15 MG/ML IJ SOLN
15.0000 mg | Freq: Once | INTRAMUSCULAR | Status: AC
  Administered 2020-03-30: 03:00:00 15 mg via INTRAVENOUS
  Filled 2020-03-30: qty 1

## 2020-03-30 MED ORDER — AMIODARONE IV BOLUS ONLY 150 MG/100ML
150.0000 mg | Freq: Once | INTRAVENOUS | Status: AC
  Administered 2020-03-30: 06:00:00 150 mg via INTRAVENOUS
  Filled 2020-03-30: qty 100

## 2020-03-30 MED ORDER — ACETAMINOPHEN 325 MG PO TABS: 650.0000 mg | ORAL_TABLET | Freq: Four times a day (QID) | ORAL | Status: DC | PRN

## 2020-03-30 MED ORDER — SODIUM CHLORIDE 0.9 % IV SOLN: INTRAVENOUS | Status: DC

## 2020-03-30 MED ORDER — LORAZEPAM 2 MG/ML IJ SOLN
1.0000 mg | INTRAMUSCULAR | Status: DC | PRN
  Administered 2020-03-30 – 2020-03-31 (×2): 1 mg via INTRAVENOUS
  Filled 2020-03-30 (×2): qty 1

## 2020-03-30 MED ORDER — GLYCOPYRROLATE 0.2 MG/ML IJ SOLN: 0.2000 mg | INTRAMUSCULAR | Status: DC | PRN

## 2020-03-30 MED ORDER — LORAZEPAM 1 MG PO TABS: 1.0000 mg | ORAL_TABLET | ORAL | Status: DC | PRN

## 2020-03-30 MED ORDER — MAGNESIUM SULFATE 2 GM/50ML IV SOLN
2.0000 g | Freq: Once | INTRAVENOUS | Status: AC
  Administered 2020-03-30: 03:00:00 2 g via INTRAVENOUS
  Filled 2020-03-30: qty 50

## 2020-03-30 MED ORDER — GLYCOPYRROLATE 1 MG PO TABS: 1.0000 mg | ORAL_TABLET | ORAL | Status: DC | PRN

## 2020-03-30 MED ORDER — METOPROLOL TARTRATE 5 MG/5ML IV SOLN
5.0000 mg | Freq: Once | INTRAVENOUS | Status: AC
  Administered 2020-03-30: 03:00:00 5 mg via INTRAVENOUS
  Filled 2020-03-30: qty 5

## 2020-03-30 MED ORDER — DIGOXIN 0.25 MG/ML IJ SOLN
0.5000 mg | Freq: Once | INTRAMUSCULAR | Status: AC
  Administered 2020-03-30: 07:00:00 0.5 mg via INTRAVENOUS
  Filled 2020-03-30: qty 2

## 2020-03-30 MED ORDER — LACTATED RINGERS IV BOLUS
1000.0000 mL | Freq: Once | INTRAVENOUS | Status: AC
  Administered 2020-03-30: 07:00:00 1000 mL via INTRAVENOUS

## 2020-03-30 MED ORDER — POLYVINYL ALCOHOL 1.4 % OP SOLN
1.0000 [drp] | Freq: Four times a day (QID) | OPHTHALMIC | Status: DC | PRN
  Filled 2020-03-30: qty 15

## 2020-03-30 MED ORDER — AMIODARONE IV BOLUS ONLY 150 MG/100ML
150.0000 mg | Freq: Once | INTRAVENOUS | Status: AC
  Administered 2020-03-30: 04:00:00 150 mg via INTRAVENOUS
  Filled 2020-03-30: qty 100

## 2020-03-30 MED ORDER — LACTATED RINGERS IV BOLUS (SEPSIS)
1000.0000 mL | Freq: Once | INTRAVENOUS | Status: AC
  Administered 2020-03-30: 1000 mL via INTRAVENOUS

## 2020-03-30 MED ORDER — LACTATED RINGERS IV SOLN: INTRAVENOUS | Status: DC

## 2020-03-30 MED ORDER — ONDANSETRON 4 MG PO TBDP: 4.0000 mg | ORAL_TABLET | Freq: Four times a day (QID) | ORAL | Status: DC | PRN

## 2020-03-30 MED ORDER — SODIUM CHLORIDE 0.9 % IV SOLN
500.0000 mg | Freq: Once | INTRAVENOUS | Status: AC
  Administered 2020-03-30: 02:00:00 500 mg via INTRAVENOUS
  Filled 2020-03-30: qty 500

## 2020-03-30 MED ORDER — ACETAMINOPHEN 650 MG RE SUPP
650.0000 mg | Freq: Once | RECTAL | Status: AC
  Administered 2020-03-30: 01:00:00 650 mg via RECTAL
  Filled 2020-03-30: qty 1

## 2020-03-30 MED ORDER — HALOPERIDOL 0.5 MG PO TABS: 0.5000 mg | ORAL_TABLET | ORAL | Status: DC | PRN

## 2020-03-30 MED ORDER — LORAZEPAM 2 MG/ML PO CONC: 1.0000 mg | ORAL | Status: DC | PRN

## 2020-03-30 MED ORDER — ACETAMINOPHEN 650 MG RE SUPP
650.0000 mg | Freq: Four times a day (QID) | RECTAL | Status: DC | PRN
  Administered 2020-03-31 (×2): 650 mg via RECTAL
  Filled 2020-03-30 (×2): qty 1

## 2020-03-30 MED ORDER — ACETAMINOPHEN 650 MG RE SUPP
650.0000 mg | Freq: Four times a day (QID) | RECTAL | Status: DC | PRN
Start: 2020-03-30 — End: 2020-03-30
  Administered 2020-03-30: 10:00:00 650 mg via RECTAL
  Filled 2020-03-30: qty 1

## 2020-03-30 MED ORDER — BIOTENE DRY MOUTH MT LIQD: 15.0000 mL | OROMUCOSAL | Status: DC | PRN

## 2020-03-30 MED ORDER — ACETAMINOPHEN 325 MG PO TABS
650.0000 mg | ORAL_TABLET | Freq: Four times a day (QID) | ORAL | Status: DC | PRN
  Administered 2020-04-01 (×2): 650 mg via ORAL
  Filled 2020-03-30 (×2): qty 2

## 2020-03-30 MED ORDER — AMIODARONE HCL IN DEXTROSE 360-4.14 MG/200ML-% IV SOLN: 60.0000 mg/h | INTRAVENOUS | Status: DC

## 2020-03-30 MED ORDER — DILTIAZEM HCL-DEXTROSE 125-5 MG/125ML-% IV SOLN (PREMIX)
5.0000 mg/h | INTRAVENOUS | Status: DC
  Administered 2020-03-30: 01:00:00 5 mg/h via INTRAVENOUS
  Filled 2020-03-30: qty 125

## 2020-03-30 MED ORDER — DILTIAZEM LOAD VIA INFUSION
10.0000 mg | Freq: Once | INTRAVENOUS | Status: AC
  Administered 2020-03-30: 01:00:00 10 mg via INTRAVENOUS
  Filled 2020-03-30: qty 10

## 2020-03-30 MED ORDER — HALOPERIDOL LACTATE 2 MG/ML PO CONC
0.5000 mg | ORAL | Status: DC | PRN
Start: 2020-03-30 — End: 2020-03-30

## 2020-03-30 MED ORDER — MAGNESIUM SULFATE 2 GM/50ML IV SOLN
2.0000 g | Freq: Once | INTRAVENOUS | Status: AC
  Administered 2020-03-30: 06:00:00 2 g via INTRAVENOUS
  Filled 2020-03-30: qty 50

## 2020-03-30 MED ORDER — ONDANSETRON HCL 4 MG PO TABS: 4.0000 mg | ORAL_TABLET | Freq: Four times a day (QID) | ORAL | Status: DC | PRN

## 2020-03-30 MED ORDER — AMIODARONE HCL IN DEXTROSE 360-4.14 MG/200ML-% IV SOLN
60.0000 mg/h | INTRAVENOUS | Status: DC
  Administered 2020-03-30 (×2): 60 mg/h via INTRAVENOUS
  Filled 2020-03-30 (×2): qty 200

## 2020-03-30 MED ORDER — METOPROLOL TARTRATE 5 MG/5ML IV SOLN: 5.0000 mg | Freq: Once | INTRAVENOUS | Status: DC

## 2020-03-30 MED ORDER — AMIODARONE IV BOLUS ONLY 150 MG/100ML
150.0000 mg | Freq: Once | INTRAVENOUS | Status: AC
  Administered 2020-03-30: 08:00:00 150 mg via INTRAVENOUS

## 2020-03-30 MED ORDER — LACTATED RINGERS IV BOLUS
1000.0000 mL | Freq: Once | INTRAVENOUS | Status: AC
  Administered 2020-03-30: 03:00:00 1000 mL via INTRAVENOUS

## 2020-03-30 MED ORDER — SODIUM CHLORIDE 0.9 % IV SOLN
1.0000 g | Freq: Once | INTRAVENOUS | Status: AC
  Administered 2020-03-30: 02:00:00 1 g via INTRAVENOUS
  Filled 2020-03-30: qty 10

## 2020-03-30 MED ORDER — LACTATED RINGERS IV BOLUS
1000.0000 mL | Freq: Once | INTRAVENOUS | Status: AC
  Administered 2020-03-30: 04:00:00 1000 mL via INTRAVENOUS

## 2020-03-30 MED ORDER — POTASSIUM PHOSPHATES 15 MMOLE/5ML IV SOLN
10.0000 mmol | Freq: Once | INTRAVENOUS | Status: DC
  Filled 2020-03-30: qty 3.33

## 2020-03-30 NOTE — Consult Note (Addendum)
Cardiology Consult    Patient ID: [name]; [id number]; 1946-01-06   Admit date: 03/30/2020 Date of Consult: 03/30/2020  Primary Care Provider: Aletha Halim., PA-C Primary Cardiologist: Kirk Ruths, MD    Patient Profile    [name] is a 74 y.o. female with past medical history of permanent atrial fibrillation (on Xarelto), history of GIB (occuring in 2017 secondary to AVM's), chronic diastolic CHF, HTN, HLD, Stage 3 CKD and prior CVA who is being seen today for the evaluation of atrial fibrillation with RVR at the request of Dr. Olevia Bowens.   History of Present Illness     [name] was last examined by the cardiology service in 05/2018 and was residing at [facility]. She had chronic dyspnea on exertion but denied any recent chest pain or palpitations.  Heart rate was elevated to the 120's during her visit that day and she was continued on Lopressor 100 mg TID along with Xarelto for anticoagulation.  She was brought to [facility] ED this morning from SNF for evaluation of altered mental status. She is unable to elaborate on her current symptoms or how long she has been in her current state. Says she just feels bad. No specific chest pain or palpitations. She was found to be febrile to 104.3 and tachycardic with heart rate in the 170's. Initial labs show WBC 21.0, Hgb 12.5, platelets 358, Na+ 138, K+ 3.8 and creatinine 1.05. Lactic Acid 2.9 with repeat of 3.8. Initial HS Troponin 6 with repeat of 7. COVID negative. CXR shows diffuse reticulonodular opacities throughout the lungs on a background of more chronic interstitial and bronchitic change. EKG shows atrial fibrillation with RVR, HR 190.  She was started on IVF per sepsis protocol along with Azithromycin and Ceftriaxone. Given her tachycardia, she was initially started on IV Cardizem but developed hypotension and required initiation of IV Amiodarone. She also received 5 mg of Lopressor around 0300 and received 2  loading doses of IV Amiodarone 150mg  at 0330 and 0530. Most recently she received IV Digoxin 0.5mg  at 0730 but rates remained elevated and after talking with the Hospitalist, she received another 150mg  IV Amiodarone. Has received 5.0 L IVF and has now been started on IV LR at 150 mL/hr.    Past Medical History:  Diagnosis Date  . Abnormal LFTs 07/24/2017  . Actinic keratosis   . Anemia    hx of  . Anemia of chronic renal failure, stage 3 (moderate) ([place]) 07/17/2015  . Anticoagulant causing adverse effect in therapeutic use 07/17/2015  . Anxiety   . Arthritis    rheumatoid  . Atrial fibrillation ([place])    a. persistent, she remains on Xarelto  . Carpal tunnel syndrome   . Chronic diastolic (congestive) heart failure ([place])    a. 04/2016: echo showing a preserved EF of 55-60% with mild MR. LA and RA midly dilated.   . Edema    left leg at ankle resolved now  . Gastroesophageal reflux disease   . Hepatitis 1970   not sure what kind  . Hiatal hernia   . Hyperlipidemia   . Hypertension   . Iron deficiency anemia due to chronic blood loss 07/17/2015  . Jaundice    age 73  . Peripheral vascular disease ([place]) yrs ago   DVT left lower leg questionale told by 2 drs i had no clot, 1 md said i did  . Rheumatoid arthritis(714.0)   . Right knee pain   .  Stroke ([place])   . Type II diabetes mellitus ([place])    type2    Past Surgical History:  Procedure Laterality Date  . CATARACT EXTRACTION Bilateral   . COLONOSCOPY WITH PROPOFOL N/A 02/20/2015   Procedure: COLONOSCOPY WITH PROPOFOL;  Surgeon: Carol Ada, MD;  Location: [facility];  Service: Endoscopy;  Laterality: N/A;  . ENTEROSCOPY N/A 03/23/2016   Procedure: ENTEROSCOPY;  Surgeon: Carol Ada, MD;  Location: [facility];  Service: Endoscopy;  Laterality: N/A;  . ENTEROSCOPY N/A 06/17/2016   Procedure: ENTEROSCOPY;  Surgeon: Carol Ada, MD;  Location: [facility];  Service: Endoscopy;  Laterality: N/A;  . ESOPHAGOGASTRODUODENOSCOPY  (EGD) WITH PROPOFOL N/A 02/20/2015   Procedure: ESOPHAGOGASTRODUODENOSCOPY (EGD) WITH PROPOFOL;  Surgeon: Carol Ada, MD;  Location: [facility];  Service: Endoscopy;  Laterality: N/A;  . GIVENS CAPSULE STUDY N/A 03/21/2016   Procedure: GIVENS CAPSULE STUDY;  Surgeon: Carol Ada, MD;  Location: [facility];  Service: Endoscopy;  Laterality: N/A;  . HAND SURGERY  1995 and 1996   artificial joints both hands  . JOINT REPLACEMENT    . LUMBAR LAMINECTOMY/DECOMPRESSION MICRODISCECTOMY N/A 02/10/2016   Procedure: MICROLUMBAR DECOMPRESSION L4-L5 AND L3- L4, AND EXCISION OF SYNOVIAL CYST L4-L5;  Surgeon: Susa Day, MD;  Location: [facility];  Service: Orthopedics;  Laterality: N/A;  . TOTAL HIP ARTHROPLASTY Left 2009  . TOTAL HIP ARTHROPLASTY Right 04/03/2013   Procedure: RIGHT TOTAL HIP ARTHROPLASTY ANTERIOR APPROACH;  Surgeon: Gearlean Alf, MD;  Location: [facility];  Service: Orthopedics;  Laterality: Right;     Home Medications:  Prior to Admission medications   Medication Sig Start Date End Date Taking? Authorizing Provider  Abatacept (ORENCIA CLICKJECT) 0000000 MG/ML SOAJ Inject 125 mg into the skin every Thursday. 03/15/18  Yes Barton Dubois, MD  acetaminophen (TYLENOL) 650 MG CR tablet Take 1,300 mg by mouth in the morning and at bedtime.   Yes [provider]  albuterol (VENTOLIN HFA) 108 (90 Base) MCG/ACT inhaler Inhale 2 puffs into the lungs every 4 (four) hours as needed for wheezing or shortness of breath.   Yes [provider]  Amino Acids-Protein Hydrolys (FEEDING SUPPLEMENT, PRO-STAT 64,) LIQD Take 30 mLs by mouth in the morning.   Yes [provider]  Ascorbic Acid (VITAMIN C PO) Take 500 mg by mouth in the morning and at bedtime.   Yes [provider]  atorvastatin (LIPITOR) 20 MG tablet Take 20 mg by mouth daily. 01/27/20  Yes [provider]  azelastine (OPTIVAR) 0.05 % ophthalmic solution Place 1 drop into both eyes 2 (two) times  daily.   Yes [provider]  buPROPion (WELLBUTRIN SR) 150 MG 12 hr tablet Take 100 mg by mouth 2 (two) times daily.   Yes [provider]  carboxymethylcellulose (ARTIFICIAL TEARS) 1 % ophthalmic solution Place 2 drops into both eyes in the morning.   Yes [provider]  Cholecalciferol (VITAMIN D3) 1000 units CAPS Take 2,000 Units by mouth daily.   Yes [provider]  Cream Base (REJUVACARE PLUS) CREA Apply 1 application topically in the morning and at bedtime. Apply to face for dry skin patches   Yes [provider]  cycloSPORINE (RESTASIS) 0.05 % ophthalmic emulsion Place 1 drop into both eyes 2 (two) times daily.   Yes [provider]  diclofenac Sodium (VOLTAREN) 1 % GEL Apply 2 g topically at bedtime as needed for pain. 03/15/20  Yes [provider]  diltiazem (CARDIZEM CD) 240 MG 24 hr capsule Take  240 mg by mouth daily. 01/27/20  Yes [provider]  Eyelid Cleansers (OCUSOFT LID SCRUB ORIGINAL) PADS Apply 1 application topically in the morning and at bedtime. Apply to both eyelids for eye problem   Yes [provider]  fluticasone-salmeterol (ADVAIR HFA) 115-21 MCG/ACT inhaler Inhale 2 puffs into the lungs 2 times daily at 12 noon and 4 pm.   Yes [provider]  furosemide (LASIX) 40 MG tablet Take 40 mg by mouth daily.    Yes [provider]  gabapentin (NEURONTIN) 100 MG capsule Take 3 capsules (300 mg total) by mouth 3 (three) times daily. 10/10/17  Yes Erick Blinks, MD  HYDROcodone-acetaminophen (NORCO/VICODIN) 5-325 MG tablet Take 1 tablet by mouth every 6 (six) hours as needed for pain. 03/24/20  Yes [provider]  insulin glargine (LANTUS) 100 UNIT/ML injection Inject 30 Units into the skin in the morning. Hold if BS is less than 150   Yes [provider]  insulin lispro (HUMALOG) 100 UNIT/ML injection Inject 6 Units into the skin in the morning and at bedtime.  Hold is BS <100   Yes [provider]  leflunomide (ARAVA) 10 MG tablet Take 10 mg by mouth daily.   Yes [provider]  lidocaine (LIDODERM) 5 % Place 1 patch onto the skin daily. Remove & Discard patch within 12 hours or as directed by MD   Yes [provider]  metFORMIN (GLUCOPHAGE-XR) 500 MG 24 hr tablet Take 1,000 mg by mouth 2 (two) times daily. 07/31/17  Yes [provider]  metoprolol tartrate (LOPRESSOR) 100 MG tablet Take 1 tablet (100 mg total) by mouth 3 (three) times daily. Patient taking differently: Take 100 mg by mouth 2 (two) times daily. 03/06/18  Yes Vassie Loll, MD  Multiple Vitamins-Minerals (CERTAGEN PO) Take 1 tablet by mouth in the morning. For wound healing   Yes [provider]  Multiple Vitamins-Minerals (PRESERVISION AREDS 2) CAPS Take 1 capsule by mouth daily.    Yes [provider]  omeprazole (PRILOSEC) 20 MG capsule Take 1 capsule by mouth daily. 02/12/18  Yes [provider]  OXYCODONE HCL PO Take 7.5 mg by mouth every 6 (six) hours as needed (Pain).   Yes [provider]  predniSONE (DELTASONE) 5 MG tablet Take 5 mg by mouth daily.   Yes [provider]  Rivaroxaban (XARELTO) 15 MG TABS tablet Take 1 tablet (15 mg total) by mouth daily. Patient taking differently: Take 10 mg by mouth daily. 07/28/17  Yes Winfrey, Harlen Labs, MD  senna (SENOKOT) 8.6 MG TABS tablet Take 1 tablet by mouth daily.   Yes [provider]  spironolactone (ALDACTONE) 25 MG tablet Take 12.5 mg by mouth in the morning. 01/27/20  Yes [provider]  VASCEPA 1 g CAPS Take 1 capsule by mouth every morning. 02/25/18  Yes [provider]  VOLTAREN 1 % GEL Apply 2 g topically at bedtime as needed for pain. Knees, calf 04/27/17  Yes [provider]  zinc sulfate 220 (50 Zn) MG capsule Take 220 mg by mouth daily.   Yes [provider]  ferrous gluconate (FERGON) 324 MG tablet  Take 1 tablet (324 mg total) by mouth 2 (two) times daily with a meal. Patient not taking: Reported on 03/30/2020 05/17/16   Calvert Cantor, MD  glimepiride (AMARYL) 2 MG tablet TAKE 1 TABLET DAILY BEFORE BREAKFAST Patient not taking: Reported on 03/30/2020 07/31/17   [provider]  nitroGLYCERIN (NITROSTAT) 0.4  MG SL tablet Place 1 tablet (0.4 mg total) under the tongue every 5 (five) minutes as needed for chest pain (x 3 doses). 11/28/16   Lelon Perla, MD  oxycodone (OXY-IR) 5 MG capsule Take 1 capsule (5 mg total) by mouth every 6 (six) hours as needed (severe pain). Patient not taking: No sig reported 03/06/18   Barton Dubois, MD  potassium chloride SA (K-DUR,KLOR-CON) 20 MEQ tablet Take 1 tablet (20 mEq total) by mouth 2 (two) times daily. Patient not taking: Reported on 03/30/2020 10/10/17   Kathie Dike, MD  vitamin B-12 1000 MCG tablet Take 1 tablet (1,000 mcg total) by mouth every evening. Patient not taking: Reported on 03/30/2020 07/05/17   Bary Leriche, PA-C    Inpatient Medications: Scheduled Meds: . pantoprazole (PROTONIX) IV  40 mg Intravenous Q24H   Continuous Infusions: . amiodarone 60 mg/hr (03/30/20 0908)  . amiodarone    . diltiazem (CARDIZEM) infusion Stopped (03/30/20 0606)  . lactated ringers 1,000 mL (03/30/20 0826)  . potassium PHOSPHATE IVPB (in mmol)     PRN Meds: acetaminophen **OR** acetaminophen, ondansetron **OR** ondansetron (ZOFRAN) IV  Allergies:    Allergies  Allergen Reactions  . Promethazine     Social History:   Social History   Socioeconomic History  . Marital status: Divorced    Spouse name: Not on file  . Number of children: Not on file  . Years of education: Not on file  . Highest education level: Not on file  Occupational History  . Occupation: Retired  Tobacco Use  . Smoking status: Never Smoker  . Smokeless tobacco: Never Used  Vaping Use  . Vaping Use: Never used  Substance and Sexual Activity  . Alcohol  use: No    Alcohol/week: 0.0 standard drinks  . Drug use: No  . Sexual activity: Not Currently  Other Topics Concern  . Not on file  Social History Narrative  . Not on file   Social Determinants of Health   Financial Resource Strain: Not on file  Food Insecurity: Not on file  Transportation Needs: Not on file  Physical Activity: Not on file  Stress: Not on file  Social Connections: Not on file  Intimate Partner Violence: Not on file     Family History:    Family History  Problem Relation Age of Onset  . Pneumonia Mother 25  . Stroke Father 63  . Heart failure Sister   . Hypertension Sister   . Heart attack Neg Hx       Review of Systems    Unable to be obtained given patient's AMS.   Physical Exam/Data    Vitals:   03/30/20 0715 03/30/20 0730 03/30/20 0745 03/30/20 0800  BP: 112/89 104/85 (!) 101/50 (!) 92/53  Pulse: (!) 103 (!) 177 (!) 181 (!) 174  Resp: (!) 33 (!) 33 (!) 32 (!) 29  Temp:      TempSrc:      SpO2: 97% 97% 97% 98%  Weight:      Height:        Intake/Output Summary (Last 24 hours) at 03/30/2020 0916 Last data filed at 03/30/2020 0815 Gross per 24 hour  Intake 2300 ml  Output --  Net 2300 ml   Filed Weights   03/30/20 0029  Weight: 81.6 kg   Body mass index is 28.18 kg/m.   General: Ill-appearing Caucasian female.  Psych: Normal affect. Neuro: Alert and oriented X 1 (person). Moves all extremities spontaneously. HEENT: Normal  Neck: Supple without bruits. JVD at 8 cm. Lungs:  Resp appear labored, rhonchi throughout upper lung fields. Heart: Tachycardiac, irregularly irregular. no s3, s4, or murmurs. Abdomen: Soft, non-tender, non-distended, BS + x 4.  Extremities: No clubbing or cyanosis. 1+ pitting edema bilaterally. DP/PT/Radials 2+ and equal bilaterally.   EKG:  The EKG was personally reviewed and demonstrates: Atrial fibrillation with RVR, HR 190.  Telemetry:  Telemetry was personally reviewed and demonstrates: Atrial  fibrillation with RVR, HR in 130's to 150's.    Labs/Studies     Relevant CV Studies:  Echocardiogram: 07/2017 Study Conclusions   - Left ventricle: The cavity size was normal. Systolic function was  normal. The estimated ejection fraction was in the range of 50%  to 55%. EF may be underestimated due to underlying atrial  fibrillation with intermittent RVR Wall motion was normal; there  were no regional wall motion abnormalities. The study was not  technically sufficient to allow evaluation of LV diastolic  dysfunction due to atrial fibrillation.  - Aortic valve: Severe diffuse calcification involving the  noncoronary cusp.  - Mitral valve: Calcified annulus. There was mild regurgitation.  - Pulmonary arteries: PA peak pressure: 36 mm Hg (S).   Impressions:   - The right ventricular systolic pressure was increased consistent  with mild pulmonary hypertension.   Laboratory Data:  Chemistry Recent Labs  Lab 03/30/20 0035 03/30/20 0716  NA 138 136  K 3.8 3.7  CL 100 100  CO2 23 23  GLUCOSE 133* 163*  BUN 23 21  CREATININE 1.05* 1.03*  CALCIUM 9.5 8.6*  GFRNONAA 55* 57*  ANIONGAP 15 13    Recent Labs  Lab 03/30/20 0035  PROT 7.5  ALBUMIN 3.6  AST 18  ALT 12  ALKPHOS 90  BILITOT 0.8   Hematology Recent Labs  Lab 03/30/20 0035  WBC 21.0*  RBC 4.73  HGB 12.5  HCT 40.9  MCV 86.5  MCH 26.4  MCHC 30.6  RDW 17.3*  PLT 358   Cardiac EnzymesNo results for input(s): TROPONINI in the last 168 hours. No results for input(s): TROPIPOC in the last 168 hours.  BNPNo results for input(s): BNP, PROBNP in the last 168 hours.  DDimer No results for input(s): DDIMER in the last 168 hours.  Radiology/Studies:  DG Chest Port 1 View  Result Date: 03/30/2020 CLINICAL DATA:  Cough, fever, possible sepsis EXAM: PORTABLE CHEST 1 VIEW COMPARISON:  Radiograph 03/04/2018 FINDINGS: There are diffuse reticulonodular opacities throughout the lungs on a  background of more chronic interstitial and bronchitic change. No consolidative opacity. No pneumothorax. No visible effusions. Cardiomegaly is similar to priors with a calcified aorta. No acute osseous or soft tissue abnormality. Degenerative changes are present in the imaged spine and shoulders. Telemetry leads overlie the chest. IMPRESSION: Diffuse reticulonodular opacities throughout the lungs on a background of more chronic interstitial and bronchitic change. While this may be partially atelectatic, could reflect atypical infection or edema in the appropriate clinical setting. Electronically Signed   By: Lovena Le M.D.   On: 03/30/2020 01:11   [place] EKG SITE RITE  Result Date: 03/30/2020 If Site Rite image not attached, placement could not be confirmed due to current cardiac rhythm.    Assessment & Plan    1. Atrial Fibrillation with RVR - She has known permanent atrial fibrillation but is currently admitted with sepsis and presented in atrial fibrillation with RVR.  - Rates have remained difficult to control as she developed hypotension with IV Cardizem  and IV Lopressor. Now on IV Amiodarone 60 mg/hr and receiving intermittent additional bolus doses of 150mg  with transient improvement in her rates. She did receive IV Digoxin 0.5mg  around 0730. Given her SBP remains in the 80's to 90's, options are limited. Therefore, would continue IV Amiodarone for rate-control purposes and will review with Dr. Harrington Challenger in regards to additional loading doses of IV Digoxin. Was on Cardizem CD 240mg  daily and Lopressor 100mg  BID prior to admission so would plan to switch back to her PO regimen once rates improve.  - This patients CHA2DS2-VASc Score and unadjusted Ischemic Stroke Rate (% per year) is equal to 11.2 % stroke rate/year from a score of 7 (HTN, Vascular, Female, Age (2), CVA (2). She was on Xarelto prior to admission. Would recommend resuming once able to take PO's.  2. Chronic Diastolic CHF - She does  have pitting edema on examination and has received 5.0+ L of IVF this admission due to sepsis and hypotension. Will ultimately require diuresis later this admission. Would also obtain an updated echocardiogram once HR improves.   3. Sepsis in the setting of PNA  - Presented with AMS and found to be febrile to 104.3 and WBC elevated to 21.0 and Lactic Acid 3.8. Blood cultures pending.  - She has been started on IV antibiotics and is receiving IVF as outlined above. Further management per the admitting team.   4. Goals of Care - Currently Full Code but a Palliative Care consult has been requested by the admitting team so this can be further addressed as she is acutely ill.      For questions or updates, please contact [place] Please consult [email or web address] for contact info under Cardiology/STEMI.  Signed, Erma Heritage, PA-C 03/30/2020, 9:16 AM Pager: [phone]   Patient seen and examined   I Agree with findings as noted above by B Strader. Pt is sleeping   In NAD    BP 84/   P 120s (Afib    110s to 180)    Sats 91% Neck is full Lungs are relatively clear with decreased BS at bases Cardiact   Irreg irreg  No S3  Ext  Tr edema   Warm   As noted above  Afib with RVR in setting of sepsis   Would continue attempts at rate control   Has received dilt, digoxin (0.5) and amiodarone (IV)   Keep at 60 mg ./ hour Resume Xarelto when taking PO   IF this is prolonged would recomm IV heparin Pt with increased volume on exam  Will need diuresis when BP and HR improve Echo when HR improves  Dorris Carnes MD

## 2020-03-30 NOTE — ED Notes (Signed)
Pt is hypotensive. Cardizem stopped and amio started per hospitalist Robb Matar.

## 2020-03-30 NOTE — Consult Note (Signed)
Consultation Note Date: 03/30/2020   Patient Name: [name]  DOB: [birth year]  MRN: [id number]  Age / Sex: 74 y.o., female  PCP: Aletha Halim., PA-C Referring Physician: Kathie Dike, MD  Reason for Consultation: Establishing goals of care and Psychosocial/spiritual support  HPI/Patient Profile: 74 y.o. female  with past medical history of chronic dHF, PVD, chronic afib- Xarelto,  DM2, HTN/HLD, actinic keratosis, anxiety, hx anticoag induced GIB, RA, carpal tunnel syndrome, GERD, hx hepatitis iabnormal LFT, nonhemorrhagic stroke admitted on 03/30/2020 with sepsis, from [facility].   Clinical Assessment and Goals of Care: [name] is lying quietly on the stretcher in the ED.  She appears acutely/chronically ill and quite frail.  She does not respond in any meaningful way to voice or touch.  She is unable to make her basic needs known.  There is no family at bedside at this time.  Conference with bedside nursing staff related to patient condition, needs.  Conference with MD [name] related to patient condition, needs, acuity.  Call to son/HC POA, [name].  [name] tells me that he is [name]'s healthcare surrogate.  He shares that he has no brothers or sisters.  He states that significant other listed in the chart, [name], is not [name]'s husband.  They were together for 30 years.  [name] states that [name] has been a resident of [facility] for almost 2 years.  We talked in detail about the acuity of her health condition at this point including, but not limited to, increased temp, white blood cells, irregular fast heart rate, sepsis/superinfection.    We talked about CODE STATUS, and she will quickly states that [name] is a DNR.  We talked about the treatment plan, and the acuity of [name]'s condition, reassuring him that we are doing what we can, but that the  medical team is very worried.   I asked [name] if he is ever heard of the concept of "let nature take its course".  He tells me that he has.  He states that he and his mother talked often about her end-of-life, that her body would likely wear out before her mind.  [name] states that indeed, he would like to focus on comfort and dignity, let nature take its course.  We talked in detail about what is and is not provided for comfort care including, but not limited to no further IV fluids or antibiotics, no medicines to prolong life.  I shared that [name] will be given medicines for pain, anxiety, breathlessness, but we will allow nature to take its course.  He states understanding and agreement.  [name] starts somewhat of a life review for his mother.  He shares that [name] participated in many trials to help with rheumatoid arthritis medications.  [name] is presently on the road for work, but is calling his employer to get time off.  He tells me that he will let [name]'s grandchildren know that she is ill and in the hospital.  I share that [name] will now have open visitation as she is comfort care.  Detail conference with attending, bedside nursing staff, adjunct hospitalist related to patient condition, needs, goals of care, full comfort care.   OPEN VISITATION    HCPOA    HCPOA -son, [name] is healthcare surrogate.  He tells me that he is an only child.  Significant other listed, [name], was [name]'s live-in boyfriend for 30 years, they are not married.    SUMMARY OF RECOMMENDATIONS   Full comfort care Let nature take its course Anticipate in hospital death, too unstable to transport to hospice center  Code Status/Advance Care Planning:  DNR  Symptom Management:   End-of-life order set implemented  Palliative Prophylaxis:   Frequent Pain Assessment, Oral Care and Turn Reposition  Additional Recommendations (Limitations, Scope, Preferences):  Full  Comfort Care  Psycho-social/Spiritual:   Desire for further Chaplaincy support:no  Additional Recommendations: Caregiving  Support/Resources and Grief/Bereavement Support  Prognosis:   Hours - Days  Discharge Planning: Anticipated Hospital Death      Primary Diagnoses: Present on Admission:  Sepsis due to undetermined organism (HCC)  Hypertension  Hypercholesterolemia  Gastroesophageal reflux disease  Anxiety and depression  Hypomagnesemia  Hypophosphatemia  Atrial fibrillation with RVR (HCC)  Chronic diastolic (congestive) heart failure (HCC)   I have reviewed the medical record, interviewed the patient and family, and examined the patient. The following aspects are pertinent.  Past Medical History:  Diagnosis Date   Abnormal LFTs 07/24/2017   Actinic keratosis    Anemia    hx of   Anemia of chronic renal failure, stage 3 (moderate) (HCC) 07/17/2015   Anticoagulant causing adverse effect in therapeutic use 07/17/2015   Anxiety    Arthritis    rheumatoid   Atrial fibrillation (HCC)    a. persistent, she remains on Xarelto   Carpal tunnel syndrome    Chronic diastolic (congestive) heart failure (HCC)    a. 04/2016: echo showing a preserved EF of 55-60% with mild MR. LA and RA midly dilated.    Edema    left leg at ankle resolved now   Gastroesophageal reflux disease    Hepatitis 1970   not sure what kind   Hiatal hernia    Hyperlipidemia    Hypertension    Iron deficiency anemia due to chronic blood loss 07/17/2015   Jaundice    age 69   Peripheral vascular disease (HCC) yrs ago   DVT left lower leg questionale told by 2 drs i had no clot, 1 md said i did   Rheumatoid arthritis(714.0)    Right knee pain    Stroke (HCC)    Type II diabetes mellitus (HCC)    type2   Social History   Socioeconomic History   Marital status: Divorced    Spouse name: Not on file   Number of children: Not on file   Years of education: Not  on file   Highest education level: Not on file  Occupational History   Occupation: Retired  Tobacco Use   Smoking status: Never Smoker   Smokeless tobacco: Never Used  Building services engineer Use: Never used  Substance and Sexual Activity   Alcohol use: No    Alcohol/week: 0.0 standard drinks   Drug use: No   Sexual activity: Not Currently  Other Topics Concern   Not on file  Social History Narrative   Not on file   Social Determinants of Health  Financial Resource Strain: Not on file  Food Insecurity: Not on file  Transportation Needs: Not on file  Physical Activity: Not on file  Stress: Not on file  Social Connections: Not on file   Family History  Problem Relation Age of Onset   Pneumonia Mother 59   Stroke Father 76   Heart failure Sister    Hypertension Sister    Heart attack Neg Hx    Scheduled Meds: Continuous Infusions:  amiodarone     lactated ringers 150 mL/hr at 03/30/20 0947   PRN Meds:.acetaminophen **OR** acetaminophen, antiseptic oral rinse, glycopyrrolate **OR** glycopyrrolate **OR** glycopyrrolate, haloperidol **OR** haloperidol **OR** haloperidol lactate, LORazepam **OR** [DISCONTINUED] LORazepam **OR** LORazepam, morphine injection, ondansetron **OR** ondansetron (ZOFRAN) IV, polyvinyl alcohol Medications Prior to Admission:  Prior to Admission medications   Medication Sig Start Date End Date Taking? Authorizing Provider  Abatacept (ORENCIA CLICKJECT) 125 MG/ML SOAJ Inject 125 mg into the skin every Thursday. 03/15/18  Yes Vassie Loll, MD  acetaminophen (TYLENOL) 650 MG CR tablet Take 1,300 mg by mouth in the morning and at bedtime.   Yes [provider]  albuterol (VENTOLIN HFA) 108 (90 Base) MCG/ACT inhaler Inhale 2 puffs into the lungs every 4 (four) hours as needed for wheezing or shortness of breath.   Yes [provider]  Amino Acids-Protein Hydrolys (FEEDING SUPPLEMENT, PRO-STAT 64,) LIQD Take 30 mLs by mouth  in the morning.   Yes [provider]  Ascorbic Acid (VITAMIN C PO) Take 500 mg by mouth in the morning and at bedtime.   Yes [provider]  atorvastatin (LIPITOR) 20 MG tablet Take 20 mg by mouth daily. 01/27/20  Yes [provider]  azelastine (OPTIVAR) 0.05 % ophthalmic solution Place 1 drop into both eyes 2 (two) times daily.   Yes [provider]  buPROPion (WELLBUTRIN SR) 150 MG 12 hr tablet Take 100 mg by mouth 2 (two) times daily.   Yes [provider]  carboxymethylcellulose (ARTIFICIAL TEARS) 1 % ophthalmic solution Place 2 drops into both eyes in the morning.   Yes [provider]  Cholecalciferol (VITAMIN D3) 1000 units CAPS Take 2,000 Units by mouth daily.   Yes [provider]  Cream Base (REJUVACARE PLUS) CREA Apply 1 application topically in the morning and at bedtime. Apply to face for dry skin patches   Yes [provider]  cycloSPORINE (RESTASIS) 0.05 % ophthalmic emulsion Place 1 drop into both eyes 2 (two) times daily.   Yes [provider]  diclofenac Sodium (VOLTAREN) 1 % GEL Apply 2 g topically at bedtime as needed for pain. 03/15/20  Yes [provider]  diltiazem (CARDIZEM CD) 240 MG 24 hr capsule Take 240 mg by mouth daily. 01/27/20  Yes [provider]  Eyelid Cleansers (OCUSOFT LID SCRUB ORIGINAL) PADS Apply 1 application topically in the morning and at bedtime. Apply to both eyelids for eye problem   Yes [provider]  fluticasone-salmeterol (ADVAIR HFA) 115-21 MCG/ACT inhaler Inhale 2 puffs into the lungs 2 times daily at 12 noon and 4 pm.   Yes [provider]  furosemide (LASIX) 40 MG tablet Take 40 mg by mouth daily.    Yes [provider]  gabapentin (NEURONTIN) 100 MG capsule Take 3 capsules (300 mg total) by mouth 3 (three) times daily. 10/10/17  Yes Erick Blinks, MD  HYDROcodone-acetaminophen (NORCO/VICODIN) 5-325 MG tablet Take 1  tablet by mouth every 6 (six) hours as needed for pain. 03/24/20  Yes  [provider]  insulin glargine (LANTUS) 100 UNIT/ML injection Inject 30 Units into the skin in the morning. Hold if BS is less than 150   Yes [provider]  insulin lispro (HUMALOG) 100 UNIT/ML injection Inject 6 Units into the skin in the morning and at bedtime. Hold is BS <100   Yes [provider]  leflunomide (ARAVA) 10 MG tablet Take 10 mg by mouth daily.   Yes [provider]  lidocaine (LIDODERM) 5 % Place 1 patch onto the skin daily. Remove & Discard patch within 12 hours or as directed by MD   Yes [provider]  metFORMIN (GLUCOPHAGE-XR) 500 MG 24 hr tablet Take 1,000 mg by mouth 2 (two) times daily. 07/31/17  Yes [provider]  metoprolol tartrate (LOPRESSOR) 100 MG tablet Take 1 tablet (100 mg total) by mouth 3 (three) times daily. Patient taking differently: Take 100 mg by mouth 2 (two) times daily. 03/06/18  Yes Barton Dubois, MD  Multiple Vitamins-Minerals (CERTAGEN PO) Take 1 tablet by mouth in the morning. For wound healing   Yes [provider]  Multiple Vitamins-Minerals (PRESERVISION AREDS 2) CAPS Take 1 capsule by mouth daily.    Yes [provider]  omeprazole (PRILOSEC) 20 MG capsule Take 1 capsule by mouth daily. 02/12/18  Yes [provider]  OXYCODONE HCL PO Take 7.5 mg by mouth every 6 (six) hours as needed (Pain).   Yes [provider]  predniSONE (DELTASONE) 5 MG tablet Take 5 mg by mouth daily.   Yes [provider]  Rivaroxaban (XARELTO) 15 MG TABS tablet Take 1 tablet (15 mg total) by mouth daily. Patient taking differently: Take 10 mg by mouth daily. 07/28/17  Yes Winfrey, Alcario Drought, MD  senna (SENOKOT) 8.6 MG TABS tablet Take 1 tablet by mouth daily.   Yes [provider]  spironolactone (ALDACTONE) 25 MG tablet Take 12.5 mg by mouth in the morning. 01/27/20  Yes [provider]   VASCEPA 1 g CAPS Take 1 capsule by mouth every morning. 02/25/18  Yes [provider]  VOLTAREN 1 % GEL Apply 2 g topically at bedtime as needed for pain. Knees, calf 04/27/17  Yes [provider]  zinc sulfate 220 (50 Zn) MG capsule Take 220 mg by mouth daily.   Yes [provider]  ferrous gluconate (FERGON) 324 MG tablet Take 1 tablet (324 mg total) by mouth 2 (two) times daily with a meal. Patient not taking: Reported on 03/30/2020 05/17/16   Debbe Odea, MD  glimepiride (AMARYL) 2 MG tablet TAKE 1 TABLET DAILY BEFORE BREAKFAST Patient not taking: Reported on 03/30/2020 07/31/17   [provider]  nitroGLYCERIN (NITROSTAT) 0.4 MG SL tablet Place 1 tablet (0.4 mg total) under the tongue every 5 (five) minutes as needed for chest pain (x 3 doses). 11/28/16   Lelon Perla, MD  oxycodone (OXY-IR) 5 MG capsule Take 1 capsule (5 mg total) by mouth every 6 (six) hours as needed (severe pain). Patient not taking: No sig reported 03/06/18   Barton Dubois, MD  potassium chloride SA (K-DUR,KLOR-CON) 20 MEQ tablet Take 1 tablet (20 mEq total) by mouth 2 (two) times daily. Patient not taking: Reported on 03/30/2020 10/10/17   Kathie Dike, MD  vitamin B-12 1000 MCG tablet Take 1 tablet (1,000 mcg total) by mouth every evening. Patient not taking: Reported on 03/30/2020 07/05/17   Bary Leriche, PA-C   Allergies  Allergen Reactions   Promethazine  Review of Systems  Unable to perform ROS: Acuity of condition    Physical Exam Vitals and nursing note reviewed.  Constitutional:      General: She is not in acute distress.    Appearance: She is obese. She is ill-appearing.  HENT:     Mouth/Throat:     Mouth: Mucous membranes are dry.  Cardiovascular:     Rate and Rhythm: Tachycardia present. Rhythm irregular.  Pulmonary:     Effort: Pulmonary effort is normal. No respiratory distress.  Abdominal:     General: There is distension.     Palpations:  Abdomen is soft.     Tenderness: There is no guarding.  Skin:    General: Skin is warm and dry.     Coloration: Skin is pale.  Neurological:     Comments: Does not respond in any meaningful way to voice or touch     Vital Signs: BP (!) 92/57    Pulse (!) 132    Temp (!) 102 F (38.9 C)    Resp (!) 32    Ht 5\' 7"  (1.702 m)    Wt 81.6 kg    SpO2 95%    BMI 28.18 kg/m  Pain Scale: Faces   Pain Score: Asleep   SpO2: SpO2: 95 % O2 Device:SpO2: 95 % O2 Flow Rate: .O2 Flow Rate (L/min): 2 L/min  IO: Intake/output summary:   Intake/Output Summary (Last 24 hours) at 03/30/2020 1025 Last data filed at 03/30/2020 [id number] Gross per 24 hour  Intake 3400 ml  Output 375 ml  Net 3025 ml    LBM:   Baseline Weight: Weight: 81.6 kg Most recent weight: Weight: 81.6 kg     Palliative Assessment/Data:   Flowsheet Rows   Flowsheet Row Most Recent Value  Intake Tab   Referral Department Hospitalist  Palliative Care Primary Diagnosis Sepsis/Infectious Disease  Date Notified 03/30/20  Palliative Care Type New Palliative care  Reason for referral Clarify Goals of Care  Date of Admission 03/30/20  Date first seen by Palliative Care 03/30/20  # of days Palliative referral response time 0 Day(s)  # of days IP prior to Palliative referral 0  Clinical Assessment   Palliative Performance Scale Score 20%  Pain Max last 24 hours Not able to report  Pain Min Last 24 hours Not able to report  Dyspnea Max Last 24 Hours Not able to report  Dyspnea Min Last 24 hours Not able to report  Psychosocial & Spiritual Assessment   Palliative Care Outcomes       Time In: 0920 Time Out: 1030 Time Total: 70 minutes  Greater than 50%  of this time was spent counseling and coordinating care related to the above assessment and plan.  Signed by: Drue Novel, NP   Please contact Palliative Medicine Team phone at [phone] for questions and concerns.  For individual provider: See Shea Evans

## 2020-03-30 NOTE — H&P (Addendum)
History and Physical    [name] [id number] DOB: February 20, 1946 DOA: 03/30/2020  PCP: Aletha Halim., PA-C   Patient coming from: [facility].  I have personally briefly reviewed patient's old medical records in [place]  Chief Complaint: AMS.  HPI: [name] is a 74 y.o. female with medical history significant of abnormal LFTs, actinic keratosis, anxiety, history of anticoagulant induced a GI bleed, rheumatoid arthritis, carpal tunnel syndrome, GERD, history of unspecified hepatitis, hyperlipidemia, history of other nonhemorrhagic stroke, type 2 diabetes, hypertension, chronic diastolic heart failure, peripheral vascular disease, chronic atrial fibrillation on Xarelto who is brought by EMS to the emergency department from [facility] due to AMS and fever.  The patient is unable to provide further information at this time.  ED Course: Initial vital signs were temperature 104.3 F, pulse 178, respirations 26, BP 132/88 mmHg and O2 sat 96% on nasal cannula oxygen.  The patient received 1000 mL of LR bolus, azithromycin 500 mg IVPB, ceftriaxone 2 g IVPB, acetaminophen 650 mg PR and was started on a Cardizem infusion.  I added 2000 mL of LR bolus, magnesium supplementation, metoprolol 5 mg IVP x1, amiodarone 150 mg IVPB x1, then amiodarone 150 mg IVPB bolus followed by continuous amiodarone infusion after discussing the case with cardiology on-call.   Labwork: Urinalysis showed proteinuria of 30 mg/dL and rare bacteria on microscopic examination.  Her CBC showed a white count of 21.0 with 87% neutrophils, hemoglobin 12.5 g/dL and platelets 358.  PT was 17.6, INR 1.5 and PTT 30.  Lactic acid 2.9 and then 3.8 mmol/L. Troponin was 6 and then 7 ng/L.  CMP showed a glucose of 133 and creatinine of 1.05 mg/dL.  All other values are within expected range.  Magnesium was 1.4 and phosphorus 2.4 mg/dL.  Coronavirus and influenza PCR was negative.  Imaging: Chest radiograph shows diffuse  15: Nodular opacities throughout the lungs on the background of more chronic interstitial and bronchitic change.  These could be atelectatic, infectious or interstitial edema.  Please see images and full regular report for further detail.  Review of Systems: As per HPI otherwise all other systems reviewed and are negative.  Past Medical History:  Diagnosis Date  . Abnormal LFTs 07/24/2017  . Actinic keratosis   . Anemia    hx of  . Anemia of chronic renal failure, stage 3 (moderate) ([place]) 07/17/2015  . Anticoagulant causing adverse effect in therapeutic use 07/17/2015  . Anxiety   . Arthritis    rheumatoid  . Atrial fibrillation ([place])    a. persistent, she remains on Xarelto  . Carpal tunnel syndrome   . Chronic diastolic (congestive) heart failure ([place])    a. 04/2016: echo showing a preserved EF of 55-60% with mild MR. LA and RA midly dilated.   . Edema    left leg at ankle resolved now  . Gastroesophageal reflux disease   . Hepatitis 1970   not sure what kind  . Hiatal hernia   . Hyperlipidemia   . Hypertension   . Iron deficiency anemia due to chronic blood loss 07/17/2015  . Jaundice    age 49  . Peripheral vascular disease ([place]) yrs ago   DVT left lower leg questionale told by 2 drs i had no clot, 1 md said i did  . Rheumatoid arthritis(714.0)   . Right knee pain   . Stroke ([place])   . Type II diabetes mellitus ([place])    type2    Past  Surgical History:  Procedure Laterality Date  . CATARACT EXTRACTION Bilateral   . COLONOSCOPY WITH PROPOFOL N/A 02/20/2015   Procedure: COLONOSCOPY WITH PROPOFOL;  Surgeon: Jeani Hawking, MD;  Location: [facility];  Service: Endoscopy;  Laterality: N/A;  . ENTEROSCOPY N/A 03/23/2016   Procedure: ENTEROSCOPY;  Surgeon: Jeani Hawking, MD;  Location: [facility];  Service: Endoscopy;  Laterality: N/A;  . ENTEROSCOPY N/A 06/17/2016   Procedure: ENTEROSCOPY;  Surgeon: Jeani Hawking, MD;  Location: [facility];  Service: Endoscopy;  Laterality:  N/A;  . ESOPHAGOGASTRODUODENOSCOPY (EGD) WITH PROPOFOL N/A 02/20/2015   Procedure: ESOPHAGOGASTRODUODENOSCOPY (EGD) WITH PROPOFOL;  Surgeon: Jeani Hawking, MD;  Location: [facility];  Service: Endoscopy;  Laterality: N/A;  . GIVENS CAPSULE STUDY N/A 03/21/2016   Procedure: GIVENS CAPSULE STUDY;  Surgeon: Jeani Hawking, MD;  Location: [facility];  Service: Endoscopy;  Laterality: N/A;  . HAND SURGERY  1995 and 1996   artificial joints both hands  . JOINT REPLACEMENT    . LUMBAR LAMINECTOMY/DECOMPRESSION MICRODISCECTOMY N/A 02/10/2016   Procedure: MICROLUMBAR DECOMPRESSION L4-L5 AND L3- L4, AND EXCISION OF SYNOVIAL CYST L4-L5;  Surgeon: Jene Every, MD;  Location: [facility];  Service: Orthopedics;  Laterality: N/A;  . TOTAL HIP ARTHROPLASTY Left 2009  . TOTAL HIP ARTHROPLASTY Right 04/03/2013   Procedure: RIGHT TOTAL HIP ARTHROPLASTY ANTERIOR APPROACH;  Surgeon: Loanne Drilling, MD;  Location: [facility];  Service: Orthopedics;  Laterality: Right;    Social History  reports that she has never smoked. She has never used smokeless tobacco. She reports that she does not drink alcohol and does not use drugs.  Allergies  Allergen Reactions  . Promethazine    Family History  Problem Relation Age of Onset  . Pneumonia Mother 16  . Stroke Father 82  . Heart failure Sister   . Hypertension Sister   . Heart attack Neg Hx    Prior to Admission medications   Medication Sig Start Date End Date Taking? Authorizing Provider  Abatacept (ORENCIA CLICKJECT) 125 MG/ML SOAJ Inject 125 mg into the skin every Thursday. 03/15/18   Vassie Loll, MD  buPROPion [facility] SR) 150 MG 12 hr tablet Take 150 mg by mouth 2 (two) times daily.      [provider]  Cholecalciferol (VITAMIN D3) 1000 units CAPS Take 1,000 Units by mouth every evening.     [provider]  ferrous gluconate (FERGON) 324 MG tablet Take 1 tablet (324 mg total) by mouth 2 (two) times daily with a meal. 05/17/16   Calvert Cantor, MD  furosemide (LASIX) 40 MG tablet Take 40 mg by mouth daily.     [provider]  gabapentin (NEURONTIN) 100 MG capsule Take 3 capsules (300 mg total) by mouth 3 (three) times daily. 10/10/17   Erick Blinks, MD  glimepiride (AMARYL) 2 MG tablet TAKE 1 TABLET DAILY BEFORE BREAKFAST 07/31/17   [provider]  leflunomide (ARAVA) 10 MG tablet Take 10 mg by mouth daily.    [provider]  lidocaine (LIDODERM) 5 % Place 1 patch onto the skin daily. Remove & Discard patch within 12 hours or as directed by MD    [provider]  metFORMIN (GLUCOPHAGE-XR) 500 MG 24 hr tablet Take 500 mg by mouth 2 (two) times daily.  07/31/17   [provider]  metoprolol tartrate (LOPRESSOR) 100 MG tablet Take 1 tablet (100 mg total) by mouth 3 (three) times daily. 03/06/18   Vassie Loll, MD  Multiple Vitamins-Minerals (PRESERVISION AREDS 2) CAPS  Take 1 capsule by mouth daily.     [provider]  nitroGLYCERIN (NITROSTAT) 0.4 MG SL tablet Place 1 tablet (0.4 mg total) under the tongue every 5 (five) minutes as needed for chest pain (x 3 doses). 11/28/16   Lelon Perla, MD  omeprazole (PRILOSEC) 20 MG capsule Take 1 capsule by mouth daily. 02/12/18   [provider]  oxycodone (OXY-IR) 5 MG capsule Take 1 capsule (5 mg total) by mouth every 6 (six) hours as needed (severe pain). 03/06/18   Barton Dubois, MD  potassium chloride SA (K-DUR,KLOR-CON) 20 MEQ tablet Take 1 tablet (20 mEq total) by mouth 2 (two) times daily. 10/10/17   Kathie Dike, MD  predniSONE (DELTASONE) 5 MG tablet Take 5 mg by mouth daily.    [provider]  Rivaroxaban (XARELTO) 15 MG TABS tablet Take 1 tablet (15 mg total) by mouth daily. 07/28/17   Kathrene Alu, MD  sennosides-docusate sodium (SENOKOT-S) 8.6-50 MG tablet Take 1 tablet by mouth daily.    [provider]  triamcinolone cream (KENALOG) 0.1 % Apply 1 application topically 2 (two) times  daily.    [provider]  VASCEPA 1 g CAPS Take 1 capsule by mouth every morning. 02/25/18   [provider]  vitamin B-12 1000 MCG tablet Take 1 tablet (1,000 mcg total) by mouth every evening. Patient taking differently: Take 2,000 mcg by mouth every evening.  07/05/17   Love, Ivan Anchors, PA-C  VOLTAREN 1 % GEL Apply 2 g topically at bedtime as needed for pain. Knees, calf 04/27/17   [provider]    Physical Exam: Vitals:   03/30/20 0144 03/30/20 0200 03/30/20 0230 03/30/20 0300  BP:  (!) 128/53 (!) 148/109   Pulse:      Resp:  (!) 34 (!) 34 (!) 31  Temp: (!) 101.1 F (38.4 C)     TempSrc:      SpO2:      Weight:      Height:        Constitutional: Looks acutely ill. Eyes: PERRL, lids and conjunctivae normal ENMT: Mucous membranes are dry. Posterior pharynx clear of any exudate or lesions. Neck: normal, supple, no masses, no thyromegaly Respiratory: Tachypneic in the high 20s.  Decreased breath sounds on bases with bilateral rhonchi.  Normal respiratory effort. No accessory muscle use.  Cardiovascular: Tachycardic in the 140s and 150s, irregularly irregular rhythm, no murmurs / rubs / gallops.  1+ bilateral lower extremity pitting edema. 2+ pedal pulses. No carotid bruits.  Abdomen: Obese, nondistended. Bowel sounds positive  Soft, no tenderness, no masses palpated. No hepatosplenomegaly..  Musculoskeletal: no clubbing / cyanosis.  Good ROM, no contractures. Normal muscle tone.  Skin: Areas of ecchymosis extremities and 5 maxillary area. Neurologic: Obtunded. Psychiatric: Obtunded.  Labs on Admission: I have personally reviewed following labs and imaging studies  CBC: Recent Labs  Lab 03/30/20 0035  WBC 21.0*  NEUTROABS 18.4*  HGB 12.5  HCT 40.9  MCV 86.5  PLT 123456    Basic Metabolic Panel: Recent Labs  Lab 03/30/20 0035  NA 138  K 3.8  CL 100  CO2 23  GLUCOSE 133*  BUN 23  CREATININE 1.05*  CALCIUM 9.5  MG 1.4*  PHOS 2.4*     GFR: Estimated Creatinine Clearance: 50.9 mL/min (A) (by C-G formula based on SCr of 1.05 mg/dL (H)).  Liver Function Tests: Recent Labs  Lab 03/30/20 0035  AST 18  ALT 12  ALKPHOS 90  BILITOT 0.8  PROT 7.5  ALBUMIN 3.6    Urine analysis:    Component Value Date/Time   COLORURINE YELLOW 03/30/2020 0051   APPEARANCEUR CLEAR 03/30/2020 0051   LABSPEC 1.020 03/30/2020 0051   PHURINE 5.0 03/30/2020 0051   GLUCOSEU NEGATIVE 03/30/2020 0051   HGBUR NEGATIVE 03/30/2020 0051   BILIRUBINUR NEGATIVE 03/30/2020 0051   KETONESUR NEGATIVE 03/30/2020 0051   PROTEINUR 30 (A) 03/30/2020 0051   UROBILINOGEN 1.0 10/09/2014 0040   NITRITE NEGATIVE 03/30/2020 0051   LEUKOCYTESUR NEGATIVE 03/30/2020 0051    Radiological Exams on Admission: DG Chest Port 1 View  Result Date: 03/30/2020 CLINICAL DATA:  Cough, fever, possible sepsis EXAM: PORTABLE CHEST 1 VIEW COMPARISON:  Radiograph 03/04/2018 FINDINGS: There are diffuse reticulonodular opacities throughout the lungs on a background of more chronic interstitial and bronchitic change. No consolidative opacity. No pneumothorax. No visible effusions. Cardiomegaly is similar to priors with a calcified aorta. No acute osseous or soft tissue abnormality. Degenerative changes are present in the imaged spine and shoulders. Telemetry leads overlie the chest. IMPRESSION: Diffuse reticulonodular opacities throughout the lungs on a background of more chronic interstitial and bronchitic change. While this may be partially atelectatic, could reflect atypical infection or edema in the appropriate clinical setting. Electronically Signed   By: Lovena Le M.D.   On: 03/30/2020 01:11    EKG: Independently reviewed.  EKG #1 Vent. rate 190 BPM PR interval * ms QRS duration 87 ms QT/QTc 225/407 ms P-R-T axes 0 69 249 Supraventricular tachycardia Low voltage, precordial leads Repolarization abnormality, prob rate related Baseline wander in lead(s)  V1  EKG #2 Vent. rate 184 BPM PR interval * ms QRS duration 65 ms QT/QTc 240/420 ms P-R-T axes * 71 254 Atrial fibrillation with rapid V-rate Low voltage, precordial leads Repolarization abnormality, prob rate related  Assessment/Plan Principal Problem:   Sepsis due to pneumonia/undetermined organism POA.  ([place]) Admit to stepdown/inpatient. Received fluid resuscitation. Continue supplemental oxygen. Bronchodilators as needed. Continue ceftriaxone 2 g IVPB every 24 hours Continue azithromycin 500 mg IVPB every 24 hours. Check strep pneumoniae urinary antigen. Check sputum Gram stain, culture and sensitivity. Follow-up blood culture and sensitivity. Follow-up lactic acid. Follow-up CBC and CMP.  Active Problems:   Chronic atrial fibrillation with RVR (HCC) CHA2DS2-VASc Score of at least 7. Did not respond to Cardizem infusion. Cardiology suggested amiodarone infusion. But not responding to amiodarone infusion. Received magnesium sulfate. Will discuss again with cardiology.    Chronic diastolic (congestive) heart failure (HCC) Does not look decompensated. Resume oral beta-blocker once more alert. Resume furosemide    Hypertension Currently having soft BP. Hold furosemide and metoprolol.    Hypercholesterolemia Not on statin.    DM2 (diabetes mellitus, type 2) (HCC) Continue Amaral 2 mg p.o. daily. CBG monitoring with RI SS.    Anxiety and depression Once more alert: Resume bupropion 150 mg p.o. twice daily. Anxiolytics as needed.    Gastroesophageal reflux disease Pantoprazole IV while obtunded.    Hypomagnesemia Supplemented. Follow magnesium level.    Hypophosphatemia Replacement ordered.   DVT prophylaxis: On Xarelto. Code Status:   Full code. Family Communication:   Disposition Plan:   Patient is from:  Home.  Anticipated DC to:  Home.  Anticipated DC date:  04/01/2020.  Anticipated DC barriers: Clinical status.  Consults called: Admission  status:  Inpatient/stepdown.    Severity of Illness:Very high due to AMS with underlying sepsis and difficult to control atrial fibrillation with RVR.  Reubin Milan MD  [facility]  How to contact the [facility] Attending or Consulting provider [place] or covering provider during after hours [place], for this patient?   1. Check the care team in [facility] and look for a) attending/consulting [facility] provider listed and b) the [facility] team listed 2. Log into [email or web address] and use [place]'s universal password to access. If you do not have the password, please contact the hospital operator. 3. Locate the [facility] provider you are looking for under [facility] and page to a number that you can be directly reached. 4. If you still have difficulty reaching the provider, please page the [facility] (Director on Call) for the Hospitalists listed on amion for assistance.  03/30/2020, 3:23 AM   This document was prepared using Dragon voice recognition software and may contain some unintended transcription errors.

## 2020-03-30 NOTE — ED Provider Notes (Signed)
[facility] EMERGENCY DEPARTMENT Provider Note   CSN: [id number] Arrival date & time: 03/30/20  0020   Time seen 12:25 AM  History Chief Complaint  Patient presents with  . Altered Mental Status   Level 5 caveat for altered mental status  [name] is a 74 y.o. female.  HPI   Patient presents from her nursing facility via EMS for altered mental status.  Patient has a diagnosis of dementia however nursing facility states she is normally alert and oriented.  They reported her blood pressure there was 235/170.  Patient seems a little agitated, she is having a hard time laying still.  She states she feels bad.  She however cannot tell me how she feels bad. She has a history of atrial fibrillation and is on Xarelto.  PCP Dr Joella Prince  Past Medical History:  Diagnosis Date  . Abnormal LFTs 07/24/2017  . Actinic keratosis   . Anemia    hx of  . Anemia of chronic renal failure, stage 3 (moderate) (HCC) 07/17/2015  . Anticoagulant causing adverse effect in therapeutic use 07/17/2015  . Anxiety   . Arthritis    rheumatoid  . Atrial fibrillation (HCC)    a. persistent, she remains on Xarelto  . Carpal tunnel syndrome   . Chronic diastolic (congestive) heart failure (HCC)    a. 04/2016: echo showing a preserved EF of 55-60% with mild MR. LA and RA midly dilated.   . Edema    left leg at ankle resolved now  . Gastroesophageal reflux disease   . Hepatitis 1970   not sure what kind  . Hiatal hernia   . Hyperlipidemia   . Hypertension   . Iron deficiency anemia due to chronic blood loss 07/17/2015  . Jaundice    age 37  . Peripheral vascular disease (HCC) yrs ago   DVT left lower leg questionale told by 2 drs i had no clot, 1 md said i did  . Rheumatoid arthritis(714.0)   . Right knee pain   . Stroke (HCC)   . Type II diabetes mellitus (HCC)    type2    Patient Active Problem List   Diagnosis Date Noted  . Hyperglycemia   . Sepsis (HCC) 10/04/2017  . HCAP  (healthcare-associated pneumonia) 10/04/2017  . Acute lower UTI 10/04/2017  . Hallucinations 07/25/2017  . Abnormal LFTs 07/24/2017  . Acute on chronic diastolic CHF (congestive heart failure) (HCC)   . Elevated LFTs   . AKI (acute kidney injury) (HCC)   . Elevated troponin   . Stroke, acute, embolic (HCC) 06/19/2017  . Mitral regurgitation 06/17/2017  . Acute CVA (cerebrovascular accident) (HCC)   . Thyroid nodule 06/15/2017  . Atrial fibrillation with RVR (HCC) 06/14/2017  . CVA (cerebral vascular accident) (HCC) 06/14/2017  . Type II diabetes mellitus (HCC)   . Peripheral vascular disease (HCC)   . Hyperlipidemia   . Hiatal hernia   . Edema   . Chronic diastolic (congestive) heart failure (HCC)   . Carpal tunnel syndrome   . Atrial fibrillation (HCC)   . Anemia   . Actinic keratosis   . Intractable nausea and vomiting 05/31/2016  . Nausea & vomiting 05/30/2016  . Abnormal chest x-ray 05/30/2016  . Diabetes mellitus with complication (HCC)   . Influenza A 05/21/2016  . Chronic atrial fibrillation (HCC) 05/20/2016  . Acute CHF (congestive heart failure) (HCC) 05/20/2016  . History of GI bleed 03/29/2016  . Chronic anticoagulation 03/29/2016  . Symptomatic anemia  03/19/2016  . Spinal stenosis of lumbar region 02/10/2016  . Varicose veins of left leg with edema 09/15/2015  . Preop cardiovascular exam 08/25/2015  . Iron deficiency anemia due to chronic blood loss 07/17/2015  . Anemia of chronic renal failure, stage 3 (moderate) ([place]) 07/17/2015  . Anticoagulant causing adverse effect in therapeutic use 07/17/2015  . Symptomatic bradycardia 02/21/2015  . Bradycardia   . Acute deep vein thrombosis (DVT) of left lower extremity ([place]) 05/06/2014  . Abdominal wall mass of right lower quadrant 07/08/2013  . OA (osteoarthritis) of hip 04/03/2013  . Osteoarthritis of hip 01/18/2013  . Night sweats 09/06/2012  . Chest pain   . Anxiety and depression   . Gastroesophageal reflux  disease   . Hepatitis   . Jaundice   . Hypertension 07/01/2010  . Hypercholesterolemia 07/01/2010  . DM2 (diabetes mellitus, type 2) ([place]) 07/01/2010  . Rheumatoid arthritis ([place]) 07/01/2010  . Sinus tachycardia 07/01/2010  . Chest pain, atypical 07/01/2010    Past Surgical History:  Procedure Laterality Date  . CATARACT EXTRACTION Bilateral   . COLONOSCOPY WITH PROPOFOL N/A 02/20/2015   Procedure: COLONOSCOPY WITH PROPOFOL;  Surgeon: Carol Ada, MD;  Location: [facility];  Service: Endoscopy;  Laterality: N/A;  . ENTEROSCOPY N/A 03/23/2016   Procedure: ENTEROSCOPY;  Surgeon: Carol Ada, MD;  Location: [facility];  Service: Endoscopy;  Laterality: N/A;  . ENTEROSCOPY N/A 06/17/2016   Procedure: ENTEROSCOPY;  Surgeon: Carol Ada, MD;  Location: [facility];  Service: Endoscopy;  Laterality: N/A;  . ESOPHAGOGASTRODUODENOSCOPY (EGD) WITH PROPOFOL N/A 02/20/2015   Procedure: ESOPHAGOGASTRODUODENOSCOPY (EGD) WITH PROPOFOL;  Surgeon: Carol Ada, MD;  Location: [facility];  Service: Endoscopy;  Laterality: N/A;  . GIVENS CAPSULE STUDY N/A 03/21/2016   Procedure: GIVENS CAPSULE STUDY;  Surgeon: Carol Ada, MD;  Location: [facility];  Service: Endoscopy;  Laterality: N/A;  . HAND SURGERY  1995 and 1996   artificial joints both hands  . JOINT REPLACEMENT    . LUMBAR LAMINECTOMY/DECOMPRESSION MICRODISCECTOMY N/A 02/10/2016   Procedure: MICROLUMBAR DECOMPRESSION L4-L5 AND L3- L4, AND EXCISION OF SYNOVIAL CYST L4-L5;  Surgeon: Susa Day, MD;  Location: [facility];  Service: Orthopedics;  Laterality: N/A;  . TOTAL HIP ARTHROPLASTY Left 2009  . TOTAL HIP ARTHROPLASTY Right 04/03/2013   Procedure: RIGHT TOTAL HIP ARTHROPLASTY ANTERIOR APPROACH;  Surgeon: Gearlean Alf, MD;  Location: [facility];  Service: Orthopedics;  Laterality: Right;     OB History   No obstetric history on file.     Family History  Problem Relation Age of Onset  . Pneumonia Mother 52  . Stroke Father 1   . Heart failure Sister   . Hypertension Sister   . Heart attack Neg Hx     Social History   Tobacco Use  . Smoking status: Never Smoker  . Smokeless tobacco: Never Used  Vaping Use  . Vaping Use: Never used  Substance Use Topics  . Alcohol use: No    Alcohol/week: 0.0 standard drinks  . Drug use: No  Lives in a nursing facility, [facility] Medications Prior to Admission medications   Medication Sig Start Date End Date Taking? Authorizing Provider  Abatacept (ORENCIA CLICKJECT) 0000000 MG/ML SOAJ Inject 125 mg into the skin every Thursday. 03/15/18   Barton Dubois, MD  buPROPion [facility] SR) 150 MG 12 hr tablet Take 150 mg by mouth 2 (two) times daily.      [provider]  Cholecalciferol (VITAMIN D3) 1000 units CAPS Take  1,000 Units by mouth every evening.     [provider]  ferrous gluconate (FERGON) 324 MG tablet Take 1 tablet (324 mg total) by mouth 2 (two) times daily with a meal. 05/17/16   Debbe Odea, MD  furosemide (LASIX) 40 MG tablet Take 40 mg by mouth daily.     [provider]  gabapentin (NEURONTIN) 100 MG capsule Take 3 capsules (300 mg total) by mouth 3 (three) times daily. 10/10/17   Kathie Dike, MD  glimepiride (AMARYL) 2 MG tablet TAKE 1 TABLET DAILY BEFORE BREAKFAST 07/31/17   [provider]  leflunomide (ARAVA) 10 MG tablet Take 10 mg by mouth daily.    [provider]  lidocaine (LIDODERM) 5 % Place 1 patch onto the skin daily. Remove & Discard patch within 12 hours or as directed by MD    [provider]  metFORMIN (GLUCOPHAGE-XR) 500 MG 24 hr tablet Take 500 mg by mouth 2 (two) times daily.  07/31/17   [provider]  metoprolol tartrate (LOPRESSOR) 100 MG tablet Take 1 tablet (100 mg total) by mouth 3 (three) times daily. 03/06/18   Barton Dubois, MD  Multiple Vitamins-Minerals (PRESERVISION AREDS 2) CAPS Take 1 capsule by mouth daily.     [provider]  nitroGLYCERIN  (NITROSTAT) 0.4 MG SL tablet Place 1 tablet (0.4 mg total) under the tongue every 5 (five) minutes as needed for chest pain (x 3 doses). 11/28/16   Lelon Perla, MD  omeprazole (PRILOSEC) 20 MG capsule Take 1 capsule by mouth daily. 02/12/18   [provider]  oxycodone (OXY-IR) 5 MG capsule Take 1 capsule (5 mg total) by mouth every 6 (six) hours as needed (severe pain). 03/06/18   Barton Dubois, MD  potassium chloride SA (K-DUR,KLOR-CON) 20 MEQ tablet Take 1 tablet (20 mEq total) by mouth 2 (two) times daily. 10/10/17   Kathie Dike, MD  predniSONE (DELTASONE) 5 MG tablet Take 5 mg by mouth daily.    [provider]  Rivaroxaban (XARELTO) 15 MG TABS tablet Take 1 tablet (15 mg total) by mouth daily. 07/28/17   Kathrene Alu, MD  sennosides-docusate sodium (SENOKOT-S) 8.6-50 MG tablet Take 1 tablet by mouth daily.    [provider]  triamcinolone cream (KENALOG) 0.1 % Apply 1 application topically 2 (two) times daily.    [provider]  VASCEPA 1 g CAPS Take 1 capsule by mouth every morning. 02/25/18   [provider]  vitamin B-12 1000 MCG tablet Take 1 tablet (1,000 mcg total) by mouth every evening. Patient taking differently: Take 2,000 mcg by mouth every evening.  07/05/17   Love, Ivan Anchors, PA-C  VOLTAREN 1 % GEL Apply 2 g topically at bedtime as needed for pain. Knees, calf 04/27/17   [provider]    Allergies    Promethazine  Review of Systems   Review of Systems  Unable to perform ROS: Mental status change    Physical Exam Updated Vital Signs BP 138/89   Pulse 88   Temp (!) 101.1 F (38.4 C)   Resp (!) 34   Ht 5\' 7"  (1.702 m)   Wt 81.6 kg   SpO2 96%   BMI 28.18 kg/m   Physical Exam Vitals and nursing note reviewed.  Constitutional:      General: She is not in acute distress.    Appearance: Normal appearance. She is obese. She is not ill-appearing or toxic-appearing.     Comments: Patient is  very restless,  she is having a hard time holding still  HENT:     Head: Normocephalic and atraumatic.     Right Ear: External ear normal.     Left Ear: External ear normal.  Eyes:     Conjunctiva/sclera: Conjunctivae normal.     Pupils: Pupils are equal, round, and reactive to light.  Cardiovascular:     Rate and Rhythm: Tachycardia present. Rhythm irregular.     Pulses: Normal pulses.  Pulmonary:     Effort: Respiratory distress present.     Breath sounds: Normal breath sounds.     Comments: Patient has a deep cough Abdominal:     General: Abdomen is flat. Bowel sounds are normal.     Palpations: Abdomen is soft.  Musculoskeletal:        General: No deformity. Normal range of motion.     Cervical back: Normal range of motion.     Right lower leg: No edema.     Left lower leg: No edema.  Skin:    General: Skin is warm and dry.     Comments: Patient is hot to touch  Neurological:     General: No focal deficit present.     Mental Status: She is alert.     Cranial Nerves: No cranial nerve deficit.  Psychiatric:        Attention and Perception: She is inattentive.        Mood and Affect: Mood is anxious.        Speech: She is noncommunicative.     ED Results / Procedures / Treatments   Labs (all labs ordered are listed, but only abnormal results are displayed) Results for orders placed or performed during the hospital encounter of 03/30/20  Blood culture (routine single)   Specimen: BLOOD RIGHT FOREARM  Result Value Ref Range   Specimen Description BLOOD RIGHT FOREARM    Special Requests      BOTTLES DRAWN AEROBIC AND ANAEROBIC Blood Culture adequate volume Performed at [facility], [address], [place], [place] [address]    Culture PENDING    Report Status PENDING   Lactic acid, plasma  Result Value Ref Range   Lactic Acid, Venous 2.9 (HH) 0.5 - 1.9 mmol/L  Comprehensive metabolic panel  Result Value Ref Range   Sodium 138 135 - 145 mmol/L   Potassium 3.8 3.5 - 5.1 mmol/L    Chloride 100 98 - 111 mmol/L   CO2 23 22 - 32 mmol/L   Glucose, Bld 133 (H) 70 - 99 mg/dL   BUN 23 8 - 23 mg/dL   Creatinine, Ser 1.05 (H) 0.44 - 1.00 mg/dL   Calcium 9.5 8.9 - 10.3 mg/dL   Total Protein 7.5 6.5 - 8.1 g/dL   Albumin 3.6 3.5 - 5.0 g/dL   AST 18 15 - 41 U/L   ALT 12 0 - 44 U/L   Alkaline Phosphatase 90 38 - 126 U/L   Total Bilirubin 0.8 0.3 - 1.2 mg/dL   GFR, Estimated 55 (L) >60 mL/min   Anion gap 15 5 - 15  CBC WITH DIFFERENTIAL  Result Value Ref Range   WBC 21.0 (H) 4.0 - 10.5 K/uL   RBC 4.73 3.87 - 5.11 MIL/uL   Hemoglobin 12.5 12.0 - 15.0 g/dL   HCT 40.9 36.0 - 46.0 %   MCV 86.5 80.0 - 100.0 fL   MCH 26.4 26.0 - 34.0 pg   MCHC 30.6 30.0 - 36.0 g/dL   RDW  17.3 (H) 11.5 - 15.5 %   Platelets 358 150 - 400 K/uL   nRBC 0.0 0.0 - 0.2 %   Neutrophils Relative % 87 %   Neutro Abs 18.4 (H) 1.7 - 7.7 K/uL   Lymphocytes Relative 6 %   Lymphs Abs 1.3 0.7 - 4.0 K/uL   Monocytes Relative 5 %   Monocytes Absolute 1.0 0.1 - 1.0 K/uL   Eosinophils Relative 1 %   Eosinophils Absolute 0.1 0.0 - 0.5 K/uL   Basophils Relative 0 %   Basophils Absolute 0.1 0.0 - 0.1 K/uL   Immature Granulocytes 1 %   Abs Immature Granulocytes 0.17 (H) 0.00 - 0.07 K/uL  Protime-INR  Result Value Ref Range   Prothrombin Time 17.6 (H) 11.4 - 15.2 seconds   INR 1.5 (H) 0.8 - 1.2  APTT  Result Value Ref Range   aPTT 30 24 - 36 seconds  Urinalysis, Routine w reflex microscopic In/Out Cath Urine  Result Value Ref Range   Color, Urine YELLOW YELLOW   APPearance CLEAR CLEAR   Specific Gravity, Urine 1.020 1.005 - 1.030   pH 5.0 5.0 - 8.0   Glucose, UA NEGATIVE NEGATIVE mg/dL   Hgb urine dipstick NEGATIVE NEGATIVE   Bilirubin Urine NEGATIVE NEGATIVE   Ketones, ur NEGATIVE NEGATIVE mg/dL   Protein, ur 30 (A) NEGATIVE mg/dL   Nitrite NEGATIVE NEGATIVE   Leukocytes,Ua NEGATIVE NEGATIVE   RBC / HPF 0-5 0 - 5 RBC/hpf   WBC, UA 0-5 0 - 5 WBC/hpf   Bacteria, UA RARE (A) NONE SEEN   Squamous  Epithelial / LPF 0-5 0 - 5  Troponin I (High Sensitivity)  Result Value Ref Range   Troponin I (High Sensitivity) 6 <18 ng/L   Laboratory interpretation all normal except leukocytosis, elevated lactic acid, mild renal insufficiency    EKG EKG Interpretation  Date/Time:  Monday March 30 2020 00:29:00 EST Ventricular Rate:  190 PR Interval:    QRS Duration: 87 QT Interval:  225 QTC Calculation: 407 R Axis:   69 Text Interpretation: Supraventricular tachycardia Low voltage, precordial leads Repolarization abnormality, prob rate related Baseline wander in lead(s) V1 No significant change since last tracing 04 Mar 2018 Confirmed by Rolland Porter [phone]) on 03/30/2020 12:39:18 AM   Radiology DG Chest Port 1 View  Result Date: 03/30/2020 CLINICAL DATA:  Cough, fever, possible sepsis EXAM: PORTABLE CHEST 1 VIEW COMPARISON:  Radiograph 03/04/2018 FINDINGS: There are diffuse reticulonodular opacities throughout the lungs on a background of more chronic interstitial and bronchitic change. No consolidative opacity. No pneumothorax. No visible effusions. Cardiomegaly is similar to priors with a calcified aorta. No acute osseous or soft tissue abnormality. Degenerative changes are present in the imaged spine and shoulders. Telemetry leads overlie the chest. IMPRESSION: Diffuse reticulonodular opacities throughout the lungs on a background of more chronic interstitial and bronchitic change. While this may be partially atelectatic, could reflect atypical infection or edema in the appropriate clinical setting. Electronically Signed   By: Lovena Le M.D.   On: 03/30/2020 01:11    Procedures .Critical Care Performed by: Rolland Porter, MD Authorized by: Rolland Porter, MD   Critical care provider statement:    Critical care time (minutes):  40   Critical care was necessary to treat or prevent imminent or life-threatening deterioration of the following conditions:  Sepsis   Critical care was time spent  personally by me on the following activities:  Examination of patient, obtaining history from patient or surrogate, ordering and  review of laboratory studies, ordering and review of radiographic studies, pulse oximetry, re-evaluation of patient's condition and review of old charts   Care discussed with: admitting provider     (including critical care time)  Medications Ordered in ED Medications  diltiazem (CARDIZEM) 1 mg/mL load via infusion 10 mg (10 mg Intravenous Bolus from Bag 03/30/20 0051)    And  diltiazem (CARDIZEM) 125 mg in dextrose 5% 125 mL (1 mg/mL) infusion (10 mg/hr Intravenous Rate/Dose Change 03/30/20 0156)  cefTRIAXone (ROCEPHIN) 1 g in sodium chloride 0.9 % 100 mL IVPB (1 g Intravenous New Bag/Given 03/30/20 0141)  azithromycin (ZITHROMAX) 500 mg in sodium chloride 0.9 % 250 mL IVPB (has no administration in time range)  lactated ringers bolus 1,000 mL (0 mLs Intravenous Stopped 03/30/20 0122)  acetaminophen (TYLENOL) suppository 650 mg (650 mg Rectal Given 03/30/20 0051)    ED Course  I have reviewed the triage vital signs and the nursing notes.  Pertinent labs & imaging results that were available during my care of the patient were reviewed by me and considered in my medical decision making (see chart for details).    MDM Rules/Calculators/A&P                          Patient heart rate up to 190 and is irregular.  She has a history of atrial fibrillation and is also on Xarelto.  She was started on a Cardizem bolus and drip.  Her blood pressure was 132/88.  She was given a liter of fluid and she did have a fever with oral temperature however I am not sure how cooperative she was for that.  Rectal temperature was done.  Sepsis protocol was started without calling code sepsis.  She is noted to have a deep cough which may be the etiology of her fever which I think probably then precipitated her atrial fib with RVR.  Patient was given rectal Tylenol for her  fever.  Reviewing patient's initial laboratory test shows elevated white blood cell count.  Her urinalysis looks normal.  Her chest x-ray was suspicious for possible pneumonia and she did have a lot of coughing.  She was started on community-acquired pneumonia antibiotics.  Recheck at 1:40 AM patient's blood pressure is 142/134, heart rate 155 and she continues on the Cardizem drip.  Respiratory rate is 30.  Patient still seems restless.  2:10 AM Dr. Olevia Bowens, hospitalist will admit  Final Clinical Impression(s) / ED Diagnoses Final diagnoses:  Atrial fibrillation with rapid ventricular response ([place])  Somnolence  Community acquired pneumonia, unspecified laterality    Rx / DC Orders  Plan admission  Rolland Porter, MD, Barbette Or, MD 03/30/20 [phone]

## 2020-03-30 NOTE — ED Notes (Signed)
carelink called Back for [name]

## 2020-03-30 NOTE — ED Notes (Signed)
Pt is moving in bed and opening eyes briefly.  When pt name called, she turns towards voice and when asked, "what's wrong?), she says, "Sleepy."

## 2020-03-30 NOTE — ED Notes (Signed)
HR increasing back to 170s. hospitalist notified

## 2020-03-30 NOTE — Progress Notes (Signed)
TR[facility] night shift addendum.  I spoke to cardiology on on call (Dr. Jacinto Halim) in regards to to the patient's lack of a response to Cardizem infusion and amiodarone. He believes that this is driven by her sepsis picture. He suggested fluid resuscitation, continue amiodarone, diltiazem pushes if blood pressure allows, cardiology and PCCM consultation.   Sanda Klein, MD.

## 2020-03-30 NOTE — ED Notes (Signed)
Pt is being made comfort care and to only maintain current IVF and Amiodarone.

## 2020-03-30 NOTE — ED Notes (Addendum)
Date and time results received: 03/30/20 0932 (use smartphrase ".now" to insert current time)  Test: Lactic Acid Critical Value: 3.0  Name of Provider Notified: Kerry Hough, MD  Orders Received? Or Actions Taken?:

## 2020-03-30 NOTE — ED Notes (Signed)
Pt is very restless, given morphine 1 mg IVP.

## 2020-03-30 NOTE — Progress Notes (Signed)
Patient admitted to the hospital earlier this morning by Dr. Robb Matar  Patient seen and examined.  She is lethargic/unresponsive.  Increased respiratory effort.  She has pitting edema.  Vitals show that she is febrile, tachycardic.  Blood pressure is low.  Assessment/plan:  74 year old female with multiple medical problems including chronic atrial fibrillation, diastolic heart failure, diabetes, hypertension, who is a resident at [facility] nursing facility, is brought to the hospital with fever, leukocytosis, elevated lactic acid consistent with sepsis secondary to community-acquired pneumonia.  She was started on intravenous antibiotics and IV fluids.  He was noted to be in rapid atrial fibrillation, likely being driven by sepsis.  Blood pressure was low making management of her heart rate challenging.  She was placed on Cardizem/amiodarone/digoxin for rate control.  Despite aggressive IV hydration and antibiotics, her overall condition continued to decline.  She is less responsive.  She continues to be febrile.  WBC count is trending up.  She was seen by palliative care with goals of care conversation held with the patient's son.  He affirmed that patient is DNR.  He agreed to focus patient's care towards quality of life and transition her to comfort measures.  Medications have been adjusted.  Hospice referral has been made.  [employer]

## 2020-03-30 NOTE — ED Notes (Addendum)
DNR  and Fall bracelets placed.

## 2020-03-30 NOTE — ED Triage Notes (Signed)
Pt arrives from [facility] for AMS. Per staff pt was seen normal at 2030. Pt has hx of dementia.

## 2020-03-30 NOTE — ED Notes (Signed)
Pt cleaned. New brief applied.

## 2020-03-31 ENCOUNTER — Inpatient Hospital Stay (HOSPITAL_COMMUNITY): Payer: Medicare Other

## 2020-03-31 DIAGNOSIS — Z515 Encounter for palliative care: Secondary | ICD-10-CM | POA: Diagnosis not present

## 2020-03-31 DIAGNOSIS — A419 Sepsis, unspecified organism: Secondary | ICD-10-CM | POA: Diagnosis not present

## 2020-03-31 DIAGNOSIS — I4891 Unspecified atrial fibrillation: Secondary | ICD-10-CM | POA: Diagnosis not present

## 2020-03-31 DIAGNOSIS — Z7189 Other specified counseling: Secondary | ICD-10-CM | POA: Diagnosis not present

## 2020-03-31 LAB — URINE CULTURE: Culture: <10000 — AB

## 2020-03-31 LAB — CBC
HCT: 38.5 % (ref 36.0–46.0)
Hemoglobin: 11.2 g/dL — ABNORMAL LOW (ref 12.0–15.0)
MCH: 26.1 pg (ref 26.0–34.0)
MCHC: 29.1 g/dL — ABNORMAL LOW (ref 30.0–36.0)
MCV: 89.7 fL (ref 80.0–100.0)
Platelets: 205 10*3/uL (ref 150–400)
RBC: 4.29 MIL/uL (ref 3.87–5.11)
RDW: 17.8 % — ABNORMAL HIGH (ref 11.5–15.5)
WBC: 14.8 10*3/uL — ABNORMAL HIGH (ref 4.0–10.5)
nRBC: 0 % (ref 0.0–0.2)

## 2020-03-31 LAB — COMPREHENSIVE METABOLIC PANEL
ALT: 9 U/L (ref 0–44)
AST: 42 U/L — ABNORMAL HIGH (ref 15–41)
Albumin: 2.7 g/dL — ABNORMAL LOW (ref 3.5–5.0)
Alkaline Phosphatase: 69 U/L (ref 38–126)
Anion gap: 12 (ref 5–15)
BUN: 16 mg/dL (ref 8–23)
CO2: 22 mmol/L (ref 22–32)
Calcium: 8.2 mg/dL — ABNORMAL LOW (ref 8.9–10.3)
Chloride: 103 mmol/L (ref 98–111)
Creatinine, Ser: 0.93 mg/dL (ref 0.44–1.00)
GFR, Estimated: >60 mL/min (ref >60)
Glucose, Bld: 155 mg/dL — ABNORMAL HIGH (ref 70–99)
Potassium: 4.4 mmol/L (ref 3.5–5.1)
Sodium: 137 mmol/L (ref 135–145)
Total Bilirubin: 1.6 mg/dL — ABNORMAL HIGH (ref 0.3–1.2)
Total Protein: 6 g/dL — ABNORMAL LOW (ref 6.5–8.1)

## 2020-03-31 LAB — BLOOD GAS, ARTERIAL
Acid-Base Excess: 2.5 mmol/L — ABNORMAL HIGH (ref 0.0–2.0)
Bicarbonate: 27.1 mmol/L (ref 20.0–28.0)
FIO2: 30
O2 Saturation: 96.1 %
Patient temperature: 37.9
pCO2 arterial: 35.7 mmHg (ref 32.0–48.0)
pH, Arterial: 7.475 — ABNORMAL HIGH (ref 7.350–7.450)
pO2, Arterial: 100 mmHg (ref 83.0–108.0)

## 2020-03-31 LAB — MRSA PCR SCREENING: MRSA by PCR: NEGATIVE

## 2020-03-31 LAB — GLUCOSE, CAPILLARY: Glucose-Capillary: 167 mg/dL — ABNORMAL HIGH (ref 70–99)

## 2020-03-31 LAB — LACTIC ACID, PLASMA
Lactic Acid, Venous: 1.6 mmol/L (ref 0.5–1.9)
Lactic Acid, Venous: 1.7 mmol/L (ref 0.5–1.9)

## 2020-03-31 MED ORDER — SODIUM CHLORIDE 0.9 % IV SOLN
1.0000 g | Freq: Once | INTRAVENOUS | Status: AC
  Administered 2020-03-31: 14:00:00 1 g via INTRAVENOUS
  Filled 2020-03-31: qty 10

## 2020-03-31 MED ORDER — KETOROLAC TROMETHAMINE 30 MG/ML IJ SOLN
30.0000 mg | Freq: Once | INTRAMUSCULAR | Status: AC
  Administered 2020-03-31: 16:00:00 30 mg via INTRAVENOUS
  Filled 2020-03-31: qty 1

## 2020-03-31 MED ORDER — SODIUM CHLORIDE 0.9 % IV SOLN
1.0000 g | INTRAVENOUS | Status: DC
  Administered 2020-03-31: 12:00:00 1 g via INTRAVENOUS
  Filled 2020-03-31: qty 10

## 2020-03-31 MED ORDER — DILTIAZEM HCL 25 MG/5ML IV SOLN
10.0000 mg | Freq: Once | INTRAVENOUS | Status: AC
  Administered 2020-03-31: 16:00:00 10 mg via INTRAVENOUS
  Filled 2020-03-31: qty 5

## 2020-03-31 MED ORDER — ORAL CARE MOUTH RINSE
15.0000 mL | Freq: Two times a day (BID) | OROMUCOSAL | Status: DC
  Administered 2020-03-31 – 2020-04-02 (×4): 15 mL via OROMUCOSAL

## 2020-03-31 MED ORDER — HEPARIN (PORCINE) 25000 UT/250ML-% IV SOLN
1200.0000 [IU]/h | INTRAVENOUS | Status: DC
  Administered 2020-03-31: 19:00:00 1200 [IU]/h via INTRAVENOUS
  Filled 2020-03-31: qty 250

## 2020-03-31 MED ORDER — HEPARIN BOLUS VIA INFUSION
4000.0000 [IU] | Freq: Once | INTRAVENOUS | Status: AC
  Administered 2020-03-31: 19:00:00 4000 [IU] via INTRAVENOUS
  Filled 2020-03-31: qty 4000

## 2020-03-31 MED ORDER — INSULIN ASPART 100 UNIT/ML ~~LOC~~ SOLN
0.0000 [IU] | Freq: Three times a day (TID) | SUBCUTANEOUS | Status: DC
  Administered 2020-04-01: 12:00:00 5 [IU] via SUBCUTANEOUS
  Administered 2020-04-01 (×2): 3 [IU] via SUBCUTANEOUS
  Administered 2020-04-02: 17:00:00 2 [IU] via SUBCUTANEOUS
  Administered 2020-04-02 – 2020-04-03 (×3): 5 [IU] via SUBCUTANEOUS
  Administered 2020-04-03: 3 [IU] via SUBCUTANEOUS

## 2020-03-31 MED ORDER — INSULIN ASPART 100 UNIT/ML ~~LOC~~ SOLN
0.0000 [IU] | Freq: Every day | SUBCUTANEOUS | Status: DC
  Administered 2020-04-01: 23:00:00 2 [IU] via SUBCUTANEOUS

## 2020-03-31 MED ORDER — DILTIAZEM HCL-DEXTROSE 125-5 MG/125ML-% IV SOLN (PREMIX)
5.0000 mg/h | INTRAVENOUS | Status: DC
  Administered 2020-03-31: 18:00:00 5 mg/h via INTRAVENOUS
  Administered 2020-04-01: 09:00:00 10 mg/h via INTRAVENOUS
  Administered 2020-04-01: 02:00:00 15 mg/h via INTRAVENOUS
  Filled 2020-03-31 (×2): qty 125

## 2020-03-31 MED ORDER — SODIUM CHLORIDE 0.9 % IV SOLN
500.0000 mg | INTRAVENOUS | Status: DC
  Administered 2020-03-31: 13:00:00 500 mg via INTRAVENOUS
  Filled 2020-03-31: qty 500

## 2020-03-31 MED ORDER — ATORVASTATIN CALCIUM 20 MG PO TABS
20.0000 mg | ORAL_TABLET | Freq: Every day | ORAL | Status: DC
  Administered 2020-04-01 – 2020-04-03 (×3): 20 mg via ORAL
  Filled 2020-03-31 (×3): qty 1

## 2020-03-31 MED ORDER — SODIUM CHLORIDE 0.9 % IV SOLN
2.0000 g | INTRAVENOUS | Status: DC
  Administered 2020-04-01 – 2020-04-03 (×3): 2 g via INTRAVENOUS
  Filled 2020-03-31 (×3): qty 20

## 2020-03-31 MED ORDER — HYDROCORTISONE NA SUCCINATE PF 100 MG IJ SOLR
100.0000 mg | Freq: Three times a day (TID) | INTRAMUSCULAR | Status: DC
  Administered 2020-03-31 – 2020-04-01 (×2): 100 mg via INTRAVENOUS
  Filled 2020-03-31 (×2): qty 2

## 2020-03-31 NOTE — Progress Notes (Addendum)
Palliative: [name], [name], is lying quietly in bed. She wakes easily when I call her name. She makes and mostly keeps eye contact, is alert and oriented to person place and situation. She has had a marked turnaround.  [name] and I talked about what brought her into the hospital. I shared that she was septic from UTI. We talked about 7 L of fluid, IV antibiotics, heart rate in the 180s. She seems in disbelief. I share that after much discussion with [name], he elected comfort care. I reassure her that we would not "giving up" on her, but wanted to make sure that we respected her wishes. [name] is tearful during this time, especially when I share that she will make choices out of love for her. She tearfully states, "I love him to". She shares that [name] is her only child.  [name] complains of "sore" in her mouth. I give her a mouth swab and she is able to clean her own mouth, removing built-up debris from the roof of her mouth. After speaking with her son [name], he shares that she had teeth extracted about 5 days ago and had stitches.  At this point, [name] tells me that she would like to continue treatment for infection. She states that when she is improved she would return to [facility] long-term care.  Conference with attending, bedside nursing staff, transition of care team related to patient condition, needs, goals of care.  Addendum: I return later in the day to find that [name], [name], has worsened.  She has increased heart rate, A. fib, and now has a fever.  Unfortunately, her son [name] has stepped away.  Nursing staff at bedside shares that he is to return later today.  [name] is more lethargic and confused than she was this morning.  It now seems that she has bacteremia. Call to son [name].  We talked about bacteremia, fevers, lethargy and the treatment plan.  At this point 24 to 48 hours for outcomes.  I share with [name] that I consider this morning may have been a rally, but time will  tell.  PMT to follow  Plan: Continue to treat the treatable, return to [facility] under long-term care when able.  65 minutes, extended time Christina Carmel, NP Palliative medicine team Team phone [phone] Greater than 50% of this time was spent counseling and coordinating care related to the above assessment and plan.

## 2020-03-31 NOTE — Progress Notes (Signed)
PROGRESS NOTE    [name]  MRN:[id number] DOB: 09/30/1945 DOA: 03/30/2020 PCP: Kaplan, Kristen W., PA-C    Brief Narrative:  74-year-old female with a history of rheumatoid arthritis on immunosuppression, chronic atrial fibrillation, diabetes, who is a resident of a skilled nursing facility, is brought to the hospital for fever, change in mental status, rapid atrial fibrillation and evidence of sepsis.  Etiology of sepsis appears to be pneumonia versus cellulitis.  She is on antibiotics and is found to have group B strep bacteremia.  She is also management of atrial fibrillation with Cardizem/amiodarone.  Cardiology and palliative care have been following.   Assessment & Plan:   Principal Problem:   Sepsis due to undetermined organism (HCC) Active Problems:   Hypertension   Hypercholesterolemia   DM2 (diabetes mellitus, type 2) (HCC)   Anxiety and depression   Gastroesophageal reflux disease   Chronic diastolic (congestive) heart failure (HCC)   Atrial fibrillation with RVR (HCC)   Sepsis (HCC)   Hypomagnesemia   Hypophosphatemia   Sepsis secondary to group B strep -Secondary to pneumonia versus cellulitis -Patient noted to have elevated WBC count on admission -She is persistently febrile -1 set of blood culture was drawn on admission which was positive for group B strep -She is on ceftriaxone -Repeat cultures will be drawn today -Blood pressure appears to be stable -Lactic acid is trending down -Since patient has chronic prednisone therapy, will start stress dose steroids  Cellulitis of left lower extremity -Since admission, patient appears to have developed erythema in her left lower extremity -This could also potentially be the cause of her bacteremia -She is chronically on Xarelto making DVT less likely -Although she has been off anticoagulation for the past 24 to 48 hours -We will check venous Dopplers to rule out DVT  Chronic A. fib with RVR -Chronically  anticoagulated with Xarelto, held on admission due to mental status -She is also chronically on diltiazem and metoprolol which were held on admission due to soft blood pressures and poor mental status -She is persistently tachycardic, likely being driven by underlying infection and fever -Initially started on Cardizem infusion and subsequently transitioned to amiodarone -These medications were discontinued after goals of care conversation -Since patient wishes to continue with aggressive care, will start the patient back on Cardizem infusion.  Blood pressures currently stable. -If she becomes hypotensive, would likely need to start amiodarone -We will start anticoagulation with heparin  Chronic diastolic congestive heart failure -Does not appear to have volume overload at this point -Holding diuretics in light of sepsis  Acute toxic encephalopathy -Secondary to sepsis -She becomes particularly confused/lethargic when she is febrile -Once fever breaks, mental status appears to improve back to baseline -Continue to monitor  Diabetes, insulin-dependent -Chronically on Lantus, Humalog, Metformin -We will hold basal insulin and Metformin -Continue sliding scale insulin until p.o. intake can be determined  Rheumatoid arthritis -Chronically on prednisone, Arava, Orencia -Started on stress dose steroids in light of sepsis  Goals of care -Palliative care had initially met with patient's son and he affirmed that patient was DNR -Goals of care with further discussion in light of her deteriorating condition at the time, her son has elected to pursue comfort measures -The following day, the patient was noted to have improved mental status, was coherent and requested to continue with current treatments -Comfort care orders were discontinued and patient was started back on antibiotics, management of A. Fib -Currently, her overall condition appears to be waxing and waning. -  Ultimately she may still  continue to decline, considering her chronic immunosuppression, multiple comorbidities and overall poor functional status. -We will need to revisit her progression in the next 24 to 48 hours   DVT prophylaxis: heparin infusion  Code Status: DNR Family Communication: attempted to update son over the phone, unable to reach him. Updated was provided to son by palliative care today Disposition Plan: Status is: Inpatient  Remains inpatient appropriate because:Inpatient level of care appropriate due to severity of illness   Dispo: The patient is from: SNF              Anticipated d/c is to: SNF              Anticipated d/c date is: > 3 days              Patient currently is not medically stable to d/c.    Consultants:   Palliative care  Cardiology  Procedures:     Antimicrobials:   Ceftriaxone 12/27 >   Subjective: Patient admitted to the hospital yesterday with fever, sepsis, rapid atrial fib.  Goals of care conversation were had in the emergency room with patient's son and it was decided to pursue comfort measures.  She was essentially unresponsive at that time.  This morning, it was reported that she was awake, alert, speaking appropriately and requested to continue treatments with antibiotics.  Upon my arrival today, patient is noted to be intermittently somnolent, heart rate is tachycardic in atrial flutter, she is febrile.  She is confused  Objective: Vitals:   03/31/20 0600 03/31/20 1447 03/31/20 1551 03/31/20 1654  BP:  (!) 119/92 131/82 112/83  Pulse:  (!) 147 (!) 113 (!) 137  Resp:  (!) 32 (!) 28 (!) 28  Temp: (!) 100.8 F (38.2 C) 100.3 F (37.9 C) (!) 102.1 F (38.9 C) 99 F (37.2 C)  TempSrc: Oral  Oral Oral  SpO2:  99%  99%  Weight:      Height:        Intake/Output Summary (Last 24 hours) at 03/31/2020 1731 Last data filed at 03/31/2020 1441 Gross per 24 hour  Intake 185 ml  Output 1600 ml  Net -1415 ml   Filed Weights   03/30/20 0029  Weight:  81.6 kg    Examination:  General exam: Appears calm and comfortable  Respiratory system: Clear to auscultation. Respiratory effort normal. Cardiovascular system: S1 & S2 heard, irregular. No JVD, murmurs, rubs, gallops or clicks. No pedal edema. Gastrointestinal system: Abdomen is nondistended, soft and nontender. No organomegaly or masses felt. Normal bowel sounds heard. Central nervous system:  No focal neurological deficits. Extremities: erythema noted over LLE Skin: Erythema noted over left lower leg Psychiatry: lethargic, confused    Data Reviewed: I have personally reviewed following labs and imaging studies  CBC: Recent Labs  Lab 03/30/20 0035 03/30/20 0900 03/31/20 1014  WBC 21.0* 32.6* 14.8*  NEUTROABS 18.4*  --   --   HGB 12.5 10.6* 11.2*  HCT 40.9 36.6 38.5  MCV 86.5 89.7 89.7  PLT 358 294 629   Basic Metabolic Panel: Recent Labs  Lab 03/30/20 0035 03/30/20 0716 03/31/20 1014  NA 138 136 137  K 3.8 3.7 4.4  CL 100 100 103  CO2 _0 GLUCOSE 133* 163* 155*  BUN _1 CREATININE 1.05* 1.03* 0.93  CALCIUM 9.5 8.6* 8.2*  MG 1.4*  --   --   PHOS 2.4*  --   --  GFR: Estimated Creatinine Clearance: 57.4 mL/min (by C-G formula based on SCr of 0.93 mg/dL). Liver Function Tests: Recent Labs  Lab 03/30/20 0035 03/31/20 1014  AST 18 42*  ALT 12 9  ALKPHOS 90 69  BILITOT 0.8 1.6*  PROT 7.5 6.0*  ALBUMIN 3.6 2.7*   No results for input(s): LIPASE, AMYLASE in the last 168 hours. No results for input(s): AMMONIA in the last 168 hours. Coagulation Profile: Recent Labs  Lab 03/30/20 0035  INR 1.5*   Cardiac Enzymes: No results for input(s): CKTOTAL, CKMB, CKMBINDEX, TROPONINI in the last 168 hours. BNP (last 3 results) No results for input(s): PROBNP in the last 8760 hours. HbA1C: No results for input(s): HGBA1C in the last 72 hours. CBG: No results for input(s): GLUCAP in the last 168 hours. Lipid Profile: No results for input(s):  CHOL, HDL, LDLCALC, TRIG, CHOLHDL, LDLDIRECT in the last 72 hours. Thyroid Function Tests: No results for input(s): TSH, T4TOTAL, FREET4, T3FREE, THYROIDAB in the last 72 hours. Anemia Panel: No results for input(s): VITAMINB12, FOLATE, FERRITIN, TIBC, IRON, RETICCTPCT in the last 72 hours. Sepsis Labs: Recent Labs  Lab 03/30/20 0630 03/30/20 0900 03/31/20 1015 03/31/20 1434  LATICACIDVEN 2.7* 3.0* 1.6 1.7    Recent Results (from the past 240 hour(s))  Blood culture (routine single)     Status: Abnormal (Preliminary result)   Collection Time: 03/30/20 12:35 AM   Specimen: BLOOD RIGHT FOREARM  Result Value Ref Range Status   Specimen Description   Final    BLOOD RIGHT FOREARM Performed at [facility], [address] [place], [place] [address]    Special Requests   Final    BOTTLES DRAWN AEROBIC AND ANAEROBIC Blood Culture adequate volume Performed at [facility], [address] [place], [place] [address]    Culture  Setup Time   Final    GRAM POSITIVE COCCI IN CHAINS Gram Stain Report Called to,Read Back By and Verified With: HOLCOMBE,R AT 1240 ON 03/30/20 BY HUFFINES,S Organism ID to follow IN BOTH AEROBIC AND ANAEROBIC BOTTLES CRITICAL RESULT CALLED TO, READ BACK BY AND VERIFIED WITH: Holcomb, R RN 03/30/20 AT 1808 SK    Culture (A)  Final    GROUP B STREP(S.AGALACTIAE)ISOLATED SUSCEPTIBILITIES TO FOLLOW Performed at [facility], [address] [place], [place] [address]    Report Status PENDING  Incomplete  Blood Culture ID Panel (Reflexed)     Status: Abnormal   Collection Time: 03/30/20 12:35 AM  Result Value Ref Range Status   Enterococcus faecalis NOT DETECTED NOT DETECTED Final   Enterococcus Faecium NOT DETECTED NOT DETECTED Final   Listeria monocytogenes NOT DETECTED NOT DETECTED Final   Staphylococcus species NOT DETECTED NOT DETECTED Final   Staphylococcus aureus (BCID) NOT DETECTED NOT DETECTED Final   Staphylococcus epidermidis NOT DETECTED NOT  DETECTED Final   Staphylococcus lugdunensis NOT DETECTED NOT DETECTED Final   Streptococcus species DETECTED (A) NOT DETECTED Final    Comment: CRITICAL RESULT CALLED TO, READ BACK BY AND VERIFIED WITH: Holcomb, R RN 03/30/20 AT 1808 SK    Streptococcus agalactiae DETECTED (A) NOT DETECTED Final    Comment: CRITICAL RESULT CALLED TO, READ BACK BY AND VERIFIED WITH: Holcomb, R RN 03/30/20 AT 1808 SK    Streptococcus pneumoniae NOT DETECTED NOT DETECTED Final   Streptococcus pyogenes NOT DETECTED NOT DETECTED Final   A.calcoaceticus-baumannii NOT DETECTED NOT DETECTED Final   Bacteroides fragilis NOT DETECTED NOT DETECTED Final   Enterobacterales NOT DETECTED NOT DETECTED Final     Enterobacter cloacae complex NOT DETECTED NOT DETECTED Final   Escherichia coli NOT DETECTED NOT DETECTED Final   Klebsiella aerogenes NOT DETECTED NOT DETECTED Final   Klebsiella oxytoca NOT DETECTED NOT DETECTED Final   Klebsiella pneumoniae NOT DETECTED NOT DETECTED Final   Proteus species NOT DETECTED NOT DETECTED Final   Salmonella species NOT DETECTED NOT DETECTED Final   Serratia marcescens NOT DETECTED NOT DETECTED Final   Haemophilus influenzae NOT DETECTED NOT DETECTED Final   Neisseria meningitidis NOT DETECTED NOT DETECTED Final   Pseudomonas aeruginosa NOT DETECTED NOT DETECTED Final   Stenotrophomonas maltophilia NOT DETECTED NOT DETECTED Final   Candida albicans NOT DETECTED NOT DETECTED Final   Candida auris NOT DETECTED NOT DETECTED Final   Candida glabrata NOT DETECTED NOT DETECTED Final   Candida krusei NOT DETECTED NOT DETECTED Final   Candida parapsilosis NOT DETECTED NOT DETECTED Final   Candida tropicalis NOT DETECTED NOT DETECTED Final   Cryptococcus neoformans/gattii NOT DETECTED NOT DETECTED Final    Comment: Performed at [facility], [address] [place], [place] [address]  Resp Panel by RT-PCR (Flu A&B, Covid) Nasopharyngeal Swab     Status: None   Collection Time:  03/30/20 12:39 AM   Specimen: Nasopharyngeal Swab; Nasopharyngeal(NP) swabs in vial transport medium  Result Value Ref Range Status   SARS Coronavirus 2 by RT PCR NEGATIVE NEGATIVE Final    Comment: (NOTE) SARS-CoV-2 target nucleic acids are NOT DETECTED.  The SARS-CoV-2 RNA is generally detectable in upper respiratory specimens during the acute phase of infection. The lowest concentration of SARS-CoV-2 viral copies this assay can detect is 138 copies/mL. A negative result does not preclude SARS-Cov-2 infection and should not be used as the sole basis for treatment or other patient management decisions. A negative result may occur with  improper specimen collection/handling, submission of specimen other than nasopharyngeal swab, presence of viral mutation(s) within the areas targeted by this assay, and inadequate number of viral copies(<138 copies/mL). A negative result must be combined with clinical observations, patient history, and epidemiological information. The expected result is Negative.  Fact Sheet for Patients:  https://www.fda.gov/media/152166/download  Fact Sheet for Healthcare Providers:  https://www.fda.gov/media/152162/download  This test is no t yet approved or cleared by the [place] FDA and  has been authorized for detection and/or diagnosis of SARS-CoV-2 by FDA under an Emergency Use Authorization (EUA). This EUA will remain  in effect (meaning this test can be used) for the duration of the COVID-19 declaration under Section 564(b)(1) of the Act, 21 U.S.C.section 360bbb-3(b)(1), unless the authorization is terminated  or revoked sooner.       Influenza A by PCR NEGATIVE NEGATIVE Final   Influenza B by PCR NEGATIVE NEGATIVE Final    Comment: (NOTE) The Xpert Xpress SARS-CoV-2/FLU/RSV plus assay is intended as an aid in the diagnosis of influenza from Nasopharyngeal swab specimens and should not be used as a sole basis for treatment. Nasal washings  and aspirates are unacceptable for Xpert Xpress SARS-CoV-2/FLU/RSV testing.  Fact Sheet for Patients: https://www.fda.gov/media/152166/download  Fact Sheet for Healthcare Providers: https://www.fda.gov/media/152162/download  This test is not yet approved or cleared by the [place] FDA and has been authorized for detection and/or diagnosis of SARS-CoV-2 by FDA under an Emergency Use Authorization (EUA). This EUA will remain in effect (meaning this test can be used) for the duration of the COVID-19 declaration under Section 564(b)(1) of the Act, 21 U.S.C. section 360bbb-3(b)(1), unless the authorization is terminated or revoked.  Performed   at [facility], [address] [place], [place] [address]   Urine culture     Status: Abnormal   Collection Time: 03/30/20 12:51 AM   Specimen: In/Out Cath Urine  Result Value Ref Range Status   Specimen Description   Final    IN/OUT CATH URINE Performed at [facility], [address] [place], [place] [address]    Special Requests   Final    NONE Performed at [facility], [address], [place], [place] [address]    Culture (A)  Final    <10,000 COLONIES/mL INSIGNIFICANT GROWTH Performed at [facility] [address], [place], [place] [address]    Report Status 03/31/2020 FINAL  Final         Radiology Studies: DG Chest Port 1 View  Result Date: 03/30/2020 CLINICAL DATA:  Cough, fever, possible sepsis EXAM: PORTABLE CHEST 1 VIEW COMPARISON:  Radiograph 03/04/2018 FINDINGS: There are diffuse reticulonodular opacities throughout the lungs on a background of more chronic interstitial and bronchitic change. No consolidative opacity. No pneumothorax. No visible effusions. Cardiomegaly is similar to priors with a calcified aorta. No acute osseous or soft tissue abnormality. Degenerative changes are present in the imaged spine and shoulders. Telemetry leads overlie the chest. IMPRESSION: Diffuse reticulonodular opacities  throughout the lungs on a background of more chronic interstitial and bronchitic change. While this may be partially atelectatic, could reflect atypical infection or edema in the appropriate clinical setting. Electronically Signed   By: Lovena Le M.D.   On: 03/30/2020 01:11   [place] EKG SITE RITE  Result Date: 03/30/2020 If Site Rite image not attached, placement could not be confirmed due to current cardiac rhythm.       Scheduled Meds: Continuous Infusions: . sodium chloride 10 mL/hr at 03/30/20 1939  . [START ON 04/01/2020] cefTRIAXone (ROCEPHIN)  IV    . diltiazem (CARDIZEM) infusion       LOS: 1 day    Time spent: 31mns    JKathie Dike MD [facility]   If 7PM-7AM, please contact night-coverage [email or web address]  03/31/2020, 5:31 PM

## 2020-03-31 NOTE — Progress Notes (Addendum)
ANTICOAGULATION CONSULT NOTE - Initial Consult  Pharmacy Consult for Heparin Indication: atrial fibrillation  Allergies  Allergen Reactions  . Promethazine     Patient Measurements: Height: 5\' 7"  (170.2 cm) Weight: 89.7 kg (197 lb 12 oz) IBW/kg (Calculated) : 61.6 Heparin Dosing Weight: 80.6kg  Vital Signs: Temp: 97.6 F (36.4 C) (12/28 1749) Temp Source: Oral (12/28 1749) BP: 102/71 (12/28 1800) Pulse Rate: 141 (12/28 1800)  Labs: Recent Labs    03/30/20 0035 03/30/20 0217 03/30/20 0716 03/30/20 0900 03/31/20 1014  HGB 12.5  --   --  10.6* 11.2*  HCT 40.9  --   --  36.6 38.5  PLT 358  --   --  294 205  APTT 30  --   --   --   --   LABPROT 17.6*  --   --   --   --   INR 1.5*  --   --   --   --   CREATININE 1.05*  --  1.03*  --  0.93  TROPONINIHS 6 7  --   --   --     Estimated Creatinine Clearance: 60.1 mL/min (by C-G formula based on SCr of 0.93 mg/dL).   Medical History: Past Medical History:  Diagnosis Date  . Abnormal LFTs 07/24/2017  . Actinic keratosis   . Anemia    hx of  . Anemia of chronic renal failure, stage 3 (moderate) (HCC) 07/17/2015  . Anticoagulant causing adverse effect in therapeutic use 07/17/2015  . Anxiety   . Arthritis    rheumatoid  . Atrial fibrillation (HCC)    a. persistent, she remains on Xarelto  . Carpal tunnel syndrome   . Chronic diastolic (congestive) heart failure (HCC)    a. 04/2016: echo showing a preserved EF of 55-60% with mild MR. LA and RA midly dilated.   . Edema    left leg at ankle resolved now  . Gastroesophageal reflux disease   . Hepatitis 1970   not sure what kind  . Hiatal hernia   . Hyperlipidemia   . Hypertension   . Iron deficiency anemia due to chronic blood loss 07/17/2015  . Jaundice    age 50  . Peripheral vascular disease (HCC) yrs ago   DVT left lower leg questionale told by 2 drs i had no clot, 1 md said i did  . Rheumatoid arthritis(714.0)   . Right knee pain   . Stroke (HCC)   . Type II  diabetes mellitus (HCC)    type2    Assessment: 74 yo W on rivaroxaban 10mg  daily for afib now held. Last dose 12/26 @ 1600. Patient is in afib RVR in the setting of sepsis. Pharmacy is consulted for heparin.   H/H 11.2, plt stable. ClCr ~60 ml/min, unclear why on reduced dose rivaroxaban.  Goal of Therapy:  Heparin level 0.3-0.7 units/ml  APTT 66- 102 sec  Monitor platelets by anticoagulation protocol: Yes   Plan:   Heparin 4000 units x1 then 1200 units/hr F/u aPTT until correlates with heparin level  F/u 6hr aPTT/HL Monitor daily aPTT, HL, CBC/plt Monitor for signs/symptoms of bleeding  F/u restart rivaroxaban   , PharmD, BCPS, BCCP Clinical Pharmacist  Please check AMION for all [facility] Pharm[facility] phone numbers After 10:00 PM, call Main Pharmacy [phone]

## 2020-03-31 NOTE — Progress Notes (Signed)
    By review of notes, the patient has transitioned to Comfort Care. Cardiology will sign-off. Please let us know if we can be of further assistance this admission.   Signed, Ellsworth Lennox, PA-C 03/31/2020, 8:46 AM

## 2020-04-01 ENCOUNTER — Inpatient Hospital Stay (HOSPITAL_COMMUNITY): Payer: Medicare Other

## 2020-04-01 DIAGNOSIS — Z515 Encounter for palliative care: Secondary | ICD-10-CM | POA: Diagnosis not present

## 2020-04-01 DIAGNOSIS — I340 Nonrheumatic mitral (valve) insufficiency: Secondary | ICD-10-CM

## 2020-04-01 DIAGNOSIS — Z7189 Other specified counseling: Secondary | ICD-10-CM | POA: Diagnosis not present

## 2020-04-01 DIAGNOSIS — R7881 Bacteremia: Secondary | ICD-10-CM

## 2020-04-01 DIAGNOSIS — I361 Nonrheumatic tricuspid (valve) insufficiency: Secondary | ICD-10-CM

## 2020-04-01 DIAGNOSIS — I4891 Unspecified atrial fibrillation: Secondary | ICD-10-CM | POA: Diagnosis not present

## 2020-04-01 DIAGNOSIS — A419 Sepsis, unspecified organism: Secondary | ICD-10-CM | POA: Diagnosis not present

## 2020-04-01 DIAGNOSIS — I5032 Chronic diastolic (congestive) heart failure: Secondary | ICD-10-CM | POA: Diagnosis not present

## 2020-04-01 LAB — ECHOCARDIOGRAM COMPLETE
Area-P 1/2: 3.16 cm2
Height: 67 in
S' Lateral: 2.4 cm
Weight: 3164.04 oz

## 2020-04-01 LAB — CBC
HCT: 36.6 % (ref 36.0–46.0)
Hemoglobin: 10.9 g/dL — ABNORMAL LOW (ref 12.0–15.0)
MCH: 26.3 pg (ref 26.0–34.0)
MCHC: 29.8 g/dL — ABNORMAL LOW (ref 30.0–36.0)
MCV: 88.2 fL (ref 80.0–100.0)
Platelets: 258 10*3/uL (ref 150–400)
RBC: 4.15 MIL/uL (ref 3.87–5.11)
RDW: 17.5 % — ABNORMAL HIGH (ref 11.5–15.5)
WBC: 11.2 10*3/uL — ABNORMAL HIGH (ref 4.0–10.5)
nRBC: 0 % (ref 0.0–0.2)

## 2020-04-01 LAB — MAGNESIUM: Magnesium: 2 mg/dL (ref 1.7–2.4)

## 2020-04-01 LAB — COMPREHENSIVE METABOLIC PANEL
ALT: 15 U/L (ref 0–44)
AST: 26 U/L (ref 15–41)
Albumin: 2.8 g/dL — ABNORMAL LOW (ref 3.5–5.0)
Alkaline Phosphatase: 77 U/L (ref 38–126)
Anion gap: 11 (ref 5–15)
BUN: 17 mg/dL (ref 8–23)
CO2: 23 mmol/L (ref 22–32)
Calcium: 8.6 mg/dL — ABNORMAL LOW (ref 8.9–10.3)
Chloride: 105 mmol/L (ref 98–111)
Creatinine, Ser: 0.78 mg/dL (ref 0.44–1.00)
GFR, Estimated: >60 mL/min (ref >60)
Glucose, Bld: 202 mg/dL — ABNORMAL HIGH (ref 70–99)
Potassium: 3.8 mmol/L (ref 3.5–5.1)
Sodium: 139 mmol/L (ref 135–145)
Total Bilirubin: 1 mg/dL (ref 0.3–1.2)
Total Protein: 6.4 g/dL — ABNORMAL LOW (ref 6.5–8.1)

## 2020-04-01 LAB — GLUCOSE, CAPILLARY
Glucose-Capillary: 176 mg/dL — ABNORMAL HIGH (ref 70–99)
Glucose-Capillary: 205 mg/dL — ABNORMAL HIGH (ref 70–99)
Glucose-Capillary: 157 mg/dL — ABNORMAL HIGH (ref 70–99)
Glucose-Capillary: 245 mg/dL — ABNORMAL HIGH (ref 70–99)

## 2020-04-01 LAB — CULTURE, BLOOD (SINGLE): Special Requests: ADEQUATE

## 2020-04-01 LAB — HEMOGLOBIN A1C
Hgb A1c MFr Bld: 6.9 % — ABNORMAL HIGH (ref 4.8–5.6)
Mean Plasma Glucose: 151.33 mg/dL

## 2020-04-01 LAB — APTT: aPTT: 85 seconds — ABNORMAL HIGH (ref 24–36)

## 2020-04-01 LAB — HEPARIN LEVEL (UNFRACTIONATED): Heparin Unfractionated: 0.34 IU/mL (ref 0.30–0.70)

## 2020-04-01 MED ORDER — DILTIAZEM HCL ER COATED BEADS 240 MG PO CP24
240.0000 mg | ORAL_CAPSULE | Freq: Every day | ORAL | Status: DC
  Administered 2020-04-01 – 2020-04-03 (×3): 240 mg via ORAL
  Filled 2020-04-01 (×3): qty 1

## 2020-04-01 MED ORDER — GABAPENTIN 300 MG PO CAPS
300.0000 mg | ORAL_CAPSULE | Freq: Three times a day (TID) | ORAL | Status: DC
  Administered 2020-04-01 – 2020-04-03 (×7): 300 mg via ORAL
  Filled 2020-04-01 (×7): qty 1

## 2020-04-01 MED ORDER — BUPROPION HCL ER (SR) 100 MG PO TB12
100.0000 mg | ORAL_TABLET | Freq: Two times a day (BID) | ORAL | Status: DC
  Administered 2020-04-01 – 2020-04-03 (×5): 100 mg via ORAL
  Filled 2020-04-01 (×5): qty 1

## 2020-04-01 MED ORDER — METOPROLOL TARTRATE 50 MG PO TABS
100.0000 mg | ORAL_TABLET | Freq: Three times a day (TID) | ORAL | Status: DC
  Administered 2020-04-01: 10:00:00 100 mg via ORAL
  Filled 2020-04-01: qty 2

## 2020-04-01 MED ORDER — SENNA 8.6 MG PO TABS
1.0000 | ORAL_TABLET | Freq: Every day | ORAL | Status: DC
  Administered 2020-04-02 – 2020-04-03 (×2): 8.6 mg via ORAL
  Filled 2020-04-01 (×3): qty 1

## 2020-04-01 MED ORDER — CYCLOSPORINE 0.05 % OP EMUL
1.0000 [drp] | Freq: Two times a day (BID) | OPHTHALMIC | Status: DC
  Administered 2020-04-01 – 2020-04-03 (×5): 1 [drp] via OPHTHALMIC
  Filled 2020-04-01 (×5): qty 1

## 2020-04-01 MED ORDER — FUROSEMIDE 40 MG PO TABS: 40.0000 mg | ORAL_TABLET | Freq: Every day | ORAL | Status: DC

## 2020-04-01 MED ORDER — RIVAROXABAN 15 MG PO TABS
15.0000 mg | ORAL_TABLET | Freq: Every day | ORAL | Status: DC
Start: 2020-04-01 — End: 2020-04-01
  Filled 2020-04-01: qty 1

## 2020-04-01 MED ORDER — METOPROLOL TARTRATE 50 MG PO TABS
50.0000 mg | ORAL_TABLET | Freq: Three times a day (TID) | ORAL | Status: DC
  Administered 2020-04-01 – 2020-04-02 (×3): 50 mg via ORAL
  Filled 2020-04-01 (×3): qty 1

## 2020-04-01 MED ORDER — PANTOPRAZOLE SODIUM 40 MG PO TBEC
40.0000 mg | DELAYED_RELEASE_TABLET | Freq: Every day | ORAL | Status: DC
  Administered 2020-04-01 – 2020-04-03 (×3): 40 mg via ORAL
  Filled 2020-04-01 (×3): qty 1

## 2020-04-01 MED ORDER — HYDROCORTISONE NA SUCCINATE PF 100 MG IJ SOLR
50.0000 mg | Freq: Two times a day (BID) | INTRAMUSCULAR | Status: DC
  Administered 2020-04-01 – 2020-04-03 (×4): 50 mg via INTRAVENOUS
  Filled 2020-04-01 (×4): qty 2

## 2020-04-01 MED ORDER — GUAIFENESIN-DM 100-10 MG/5ML PO SYRP
5.0000 mL | ORAL_SOLUTION | ORAL | Status: DC | PRN
  Filled 2020-04-01 (×2): qty 5

## 2020-04-01 MED ORDER — SPIRONOLACTONE 12.5 MG HALF TABLET: 12.5000 mg | ORAL_TABLET | Freq: Every morning | ORAL | Status: DC

## 2020-04-01 MED ORDER — POLYVINYL ALCOHOL 1.4 % OP SOLN
2.0000 [drp] | Freq: Every morning | OPHTHALMIC | Status: DC
  Administered 2020-04-01 – 2020-04-03 (×3): 2 [drp] via OPHTHALMIC

## 2020-04-01 MED ORDER — ALBUTEROL SULFATE HFA 108 (90 BASE) MCG/ACT IN AERS
2.0000 | INHALATION_SPRAY | RESPIRATORY_TRACT | Status: DC | PRN
  Filled 2020-04-01: qty 6.7

## 2020-04-01 MED ORDER — MOMETASONE FURO-FORMOTEROL FUM 200-5 MCG/ACT IN AERO
2.0000 | INHALATION_SPRAY | Freq: Two times a day (BID) | RESPIRATORY_TRACT | Status: DC
  Administered 2020-04-01 – 2020-04-03 (×5): 2 via RESPIRATORY_TRACT
  Filled 2020-04-01 (×2): qty 8.8

## 2020-04-01 MED ORDER — FUROSEMIDE 10 MG/ML IJ SOLN
40.0000 mg | Freq: Once | INTRAMUSCULAR | Status: AC
  Administered 2020-04-01: 12:00:00 40 mg via INTRAVENOUS
  Filled 2020-04-01: qty 4

## 2020-04-01 MED ORDER — RIVAROXABAN 20 MG PO TABS
20.0000 mg | ORAL_TABLET | Freq: Every day | ORAL | Status: DC
  Administered 2020-04-01 – 2020-04-03 (×3): 20 mg via ORAL
  Filled 2020-04-01 (×3): qty 1

## 2020-04-01 MED ORDER — HYDROCODONE-ACETAMINOPHEN 5-325 MG PO TABS
1.0000 | ORAL_TABLET | Freq: Four times a day (QID) | ORAL | Status: DC | PRN
  Administered 2020-04-02 (×2): 1 via ORAL
  Filled 2020-04-01 (×2): qty 1

## 2020-04-01 NOTE — Evaluation (Signed)
Physical Therapy Evaluation Patient Details Name: [name] MRN: [id number] DOB: 04-27-1945 Today's Date: 04/01/2020   History of Present Illness  [name] is a 74 y.o. female with medical history significant of abnormal LFTs, actinic keratosis, anxiety, history of anticoagulant induced a GI bleed, rheumatoid arthritis, carpal tunnel syndrome, GERD, history of unspecified hepatitis, hyperlipidemia, history of other nonhemorrhagic stroke, type 2 diabetes, hypertension, chronic diastolic heart failure, peripheral vascular disease, chronic atrial fibrillation on Xarelto who is brought by EMS to the emergency department from [facility] due to AMS and fever.  The patient is unable to provide further information at this time.    Clinical Impression  Patient functioning near baseline for functional mobility and gait, demonstrates labored cadence without loss of balance, once fatigued has to lean over RW with slowed cadence and put back to bed after therapy to receive a echocardiogram.  Patient will benefit from continued physical therapy in hospital and recommended venue below to increase strength, balance, endurance for safe ADLs and gait.     Follow Up Recommendations Home health PT;Supervision for mobility/OOB;Supervision - Intermittent    Equipment Recommendations  None recommended by PT    Recommendations for Other Services       Precautions / Restrictions Precautions Precautions: Fall Restrictions Weight Bearing Restrictions: No      Mobility  Bed Mobility Overal bed mobility: Modified Independent             General bed mobility comments: slightly increased time    Transfers Overall transfer level: Needs assistance   Transfers: Sit to/from Stand;Stand Pivot Transfers Sit to Stand: Supervision;Min guard Stand pivot transfers: Supervision;Min guard       General transfer comment: slightly labored movement, increased time  Ambulation/Gait Ambulation/Gait  assistance: Min guard;Supervision Gait Distance (Feet): 55 Feet Assistive device: Rolling walker (2 wheeled) Gait Pattern/deviations: Decreased step length - right;Decreased step length - left;Decreased stride length;Trunk flexed Gait velocity: decreased   General Gait Details: labored cadence without loss of balance, limited secondary to fatigue, has to lean over RW once fatigued with slowed cadence  Stairs            Wheelchair Mobility    Modified Rankin (Stroke Patients Only)       Balance Overall balance assessment: Needs assistance Sitting-balance support: Feet supported;No upper extremity supported Sitting balance-Leahy Scale: Good Sitting balance - Comments: seated at EOB   Standing balance support: During functional activity;Bilateral upper extremity supported Standing balance-Leahy Scale: Fair Standing balance comment: using RW                             Pertinent Vitals/Pain Pain Assessment: No/denies pain    Home Living Family/patient expects to be discharged to:: Skilled nursing facility                      Prior Function Level of Independence: Needs assistance   Gait / Transfers Assistance Needed: household ambulator using RW or siderails in hallway of SNF  ADL's / Homemaking Assistance Needed: assisted by SNF staff        Hand Dominance   Dominant Hand: Right    Extremity/Trunk Assessment   Upper Extremity Assessment Upper Extremity Assessment: Overall WFL for tasks assessed    Lower Extremity Assessment Lower Extremity Assessment: Generalized weakness    Cervical / Trunk Assessment Cervical / Trunk Assessment: Normal  Communication   Communication: No difficulties  Cognition Arousal/Alertness: Awake/alert  Behavior During Therapy: WFL for tasks assessed/performed Overall Cognitive Status: Within Functional Limits for tasks assessed                                        General Comments       Exercises     Assessment/Plan    PT Assessment Patient needs continued PT services  PT Problem List Decreased strength;Decreased activity tolerance;Decreased balance;Decreased mobility       PT Treatment Interventions DME instruction;Gait training;Stair training;Functional mobility training;Therapeutic activities;Therapeutic exercise;Patient/family education;Balance training    PT Goals (Current goals can be found in the Care Plan section)  Acute Rehab PT Goals Patient Stated Goal: return home with SNF staff to assist PT Goal Formulation: With patient Time For Goal Achievement: 04/04/20 Potential to Achieve Goals: Good    Frequency Min 3X/week   Barriers to discharge        Co-evaluation               AM-PAC PT "6 Clicks" Mobility  Outcome Measure Help needed turning from your back to your side while in a flat bed without using bedrails?: None Help needed moving from lying on your back to sitting on the side of a flat bed without using bedrails?: None Help needed moving to and from a bed to a chair (including a wheelchair)?: A Little Help needed standing up from a chair using your arms (e.g., wheelchair or bedside chair)?: A Little Help needed to walk in hospital room?: A Little Help needed climbing 3-5 steps with a railing? : A Lot 6 Click Score: 19    End of Session   Activity Tolerance: Patient tolerated treatment well;Patient limited by fatigue Patient left: in bed;with call bell/phone within reach Nurse Communication: Mobility status PT Visit Diagnosis: Unsteadiness on feet (R26.81);Other abnormalities of gait and mobility (R26.89);Muscle weakness (generalized) (M62.81)    Time: [id number] PT Time Calculation (min) (ACUTE ONLY): 30 min   Charges:   PT Evaluation $PT Eval Moderate Complexity: 1 Mod PT Treatments $Therapeutic Activity: 23-37 mins        3:51 PM, 04/01/20 Lonell Grandchild, MPT Physical Therapist with [facility] [phone] office [phone] mobile phone

## 2020-04-01 NOTE — TOC Initial Note (Signed)
Transition of Care [facility]) - Initial/Assessment Note   Patient Details  Name: [name] MRN: [id number] Date of Birth: 06-Mar-1946  [facility]) CM/SW Contact:    Sherie Don, LCSW Phone Number: 04/01/2020, 12:56 PM  Clinical Narrative: Patient is 74 year old female who was admitted for sepsis due to undetermined organism. Patient has a history of hypertension, hypercholesterolemia, DM2, anxiety, depression, gastroesophageal reflux disease, chronic diastolic heart failure, and atrial fibrillation with RVR. Readmission checklist completed due to high readmission score.  CSW spoke with Melissa at [facility] to complete assessment due to patient being oriented x2. Per Melissa, patient is normally oriented x4 and only requires limited assistance with ADLs at baseline. Patient can return to the facility when medically ready. FL2 started. TOC to follow.  Expected Discharge Plan: Skilled Nursing Facility Barriers to Discharge: Continued Medical Work up  Patient Goals and CMS Choice Patient states their goals for this hospitalization and ongoing recovery are:: Return to [facility] [employer] Compare Post Acute Care list provided to:: Patient Choice offered to / list presented to : Patient  Expected Discharge Plan and Services Expected Discharge Plan: [place] In-house Referral: Clinical Social Work Discharge Planning Services: NA Post Acute Care Choice: [place] (Patient is a LTC resident of [place]) Living arrangements for the past 2 months: [place]             DME Arranged: N/A DME Agency: NA HH Arranged: NA [place] Agency: NA  Prior Living Arrangements/Services Living arrangements for the past 2 months: [place] Lives with:: Facility Resident Do you feel safe going back to the place where you live?: Yes      Need for Family Participation in Patient Care: Yes (Comment) Care giver support  system in place?: Yes (comment) Criminal Activity/Legal Involvement Pertinent to Current Situation/Hospitalization: No - Comment as needed  Emotional Assessment Appearance:: Appears stated age Orientation: : Oriented to Self,Oriented to Place Alcohol / Substance Use: Not Applicable Psych Involvement: No (comment)  Admission diagnosis:  Somnolence [R40.0] Atrial fibrillation with rapid ventricular response (HCC) [I48.91] Atrial fibrillation with RVR (HCC) [I48.91] Acute on chronic diastolic CHF (congestive heart failure) ([place]) [I50.33] Sepsis ([place]) [A41.9] Sepsis due to undetermined organism ([place]) [A41.9] Community acquired pneumonia, unspecified laterality [J18.9] Patient Active Problem List   Diagnosis Date Noted  . Sepsis due to undetermined organism ([place]) 03/30/2020  . Hypomagnesemia 03/30/2020  . Hypophosphatemia 03/30/2020  . Hyperglycemia   . Sepsis ([place]) 10/04/2017  . HCAP (healthcare-associated pneumonia) 10/04/2017  . Acute lower UTI 10/04/2017  . Hallucinations 07/25/2017  . Abnormal LFTs 07/24/2017  . Acute on chronic diastolic CHF (congestive heart failure) ([place])   . Elevated LFTs   . AKI (acute kidney injury) ([place])   . Elevated troponin   . Stroke, acute, embolic ([place]) [id number]  . Mitral regurgitation 06/17/2017  . Acute CVA (cerebrovascular accident) ([place])   . Thyroid nodule 06/15/2017  . Atrial fibrillation with RVR ([place]) 06/14/2017  . CVA (cerebral vascular accident) ([place]) 06/14/2017  . Type II diabetes mellitus ([place])   . Peripheral vascular disease ([place])   . Hyperlipidemia   . Hiatal hernia   . Edema   . Chronic diastolic (congestive) heart failure ([place])   . Carpal tunnel syndrome   . Atrial fibrillation ([place])   . Anemia   . Actinic keratosis   . Intractable nausea and vomiting 05/31/2016  . Nausea & vomiting 05/30/2016  . Abnormal chest x-ray 05/30/2016  .  Diabetes mellitus with complication (HCC)   . Influenza A 05/21/2016  . Chronic atrial  fibrillation (HCC) 05/20/2016  . Acute CHF (congestive heart failure) (HCC) 05/20/2016  . History of GI bleed 03/29/2016  . Chronic anticoagulation 03/29/2016  . Symptomatic anemia 03/19/2016  . Spinal stenosis of lumbar region 02/10/2016  . Varicose veins of left leg with edema 09/15/2015  . Preop cardiovascular exam 08/25/2015  . Iron deficiency anemia due to chronic blood loss 07/17/2015  . Anemia of chronic renal failure, stage 3 (moderate) (HCC) 07/17/2015  . Anticoagulant causing adverse effect in therapeutic use 07/17/2015  . Symptomatic bradycardia 02/21/2015  . Bradycardia   . Acute deep vein thrombosis (DVT) of left lower extremity (HCC) 05/06/2014  . Abdominal wall mass of right lower quadrant 07/08/2013  . OA (osteoarthritis) of hip 04/03/2013  . Osteoarthritis of hip 01/18/2013  . Night sweats 09/06/2012  . Chest pain   . Anxiety and depression   . Gastroesophageal reflux disease   . Hepatitis   . Jaundice   . Hypertension 07/01/2010  . Hypercholesterolemia 07/01/2010  . DM2 (diabetes mellitus, type 2) (HCC) 07/01/2010  . Rheumatoid arthritis (HCC) 07/01/2010  . Sinus tachycardia 07/01/2010  . Chest pain, atypical 07/01/2010   PCP:  Richmond Campbell., PA-C Pharmacy:  No Pharmacies Listed  Readmission Risk Interventions Readmission Risk Prevention Plan 04/01/2020  Transportation Screening Complete  PCP or Specialist Appt within 3-5 Days Not Complete  Not Complete comments Patient's PCP appointments are set up at her facility  [facility] or Home Care Consult Complete  Social Work Consult for Recovery Care Planning/Counseling Complete  Palliative Care Screening Complete  Medication Review Oceanographer) Complete  Some recent data might be hidden

## 2020-04-01 NOTE — Progress Notes (Signed)
PROGRESS NOTE    [name]  [id number] DOB: September 22, 1945 DOA: 03/30/2020 PCP: Aletha Halim., PA-C    Brief Narrative:  74 year old female with history of rheumatoid arthritis on chronic prednisone therapy, chronic A. fib and anticoagulated on Xarelto, diabetes on insulin who is a resident of a skilled nursing facility brought to the emergency room with fever, change in mental status, rapid A. fib and sepsis.  Initially with altered mental status, treated with broad-spectrum antibiotics.  She had some evidence of left leg cellulitis as well as pneumonia. 12/27, admitted with severe sepsis and rapid A. fib with altered mental status.  After admission, clinical condition was poor and started on comfort care measures. 12/28-12/29, patient did gradual clinical recovery and now she is awake with clinical stabilization.  Plan to treat the treatable conditions and discharged back to long-term nursing home. Patient reported that she had dental surgery done on 12/21 and had 3 teeth removed and started feeling sick since very next day.   Assessment & Plan:   Principal Problem:   Sepsis due to undetermined organism [facility]) Active Problems:   Hypertension   Hypercholesterolemia   DM2 (diabetes mellitus, type 2) (HCC)   Anxiety and depression   Gastroesophageal reflux disease   Chronic diastolic (congestive) heart failure (HCC)   Atrial fibrillation with RVR (HCC)   Sepsis (HCC)   Hypomagnesemia   Hypophosphatemia  Sepsis secondary to group B Streptococcus: Possible oral source as her symptoms started after dental surgery.  She has healing gums with no evidence of remaining source of infection. Also has left leg redness and erythema. Blood cultures 12/27+ for group B streptococcus.  She remains on high-dose Rocephin. Since patient has done good clinical recovery and now desires to be treated, Repeat blood cultures 12/28 pending We will order TTE today, if repeat blood cultures  positive, will need TEE. I called and discussed case with ID physician.  Chronic A. fib with RVR: Developed persistent A. fib and RVR with underlying sepsis.  Now rate controlled.  Treated with Cardizem drip and heparin. Start patient on oral Cardizem, resume metoprolol.  Resume Xarelto. Discussed with cardiology, her heart rate is acceptable, given sepsis and fever, anticipate fluctuating heart rate.  Will treat only for symptomatic rapid A. Fib.  Right leg swelling and erythema: Duplex negative for DVT.  Some clinical improvement.  Antibiotics to be continued.  Acute metabolic infective encephalopathy: Mental status improved.  Continue to monitor.  Type 2 diabetes on insulin: Continue current doses of insulin.  Metformin on hold.  Rheumatoid arthritis: On chronic prednisone, Arava and Orencia.  Given the stress dose of steroids.  We will decrease the doses and gradually put her back on her chronic doses.  Goal of care: Initially with no appropriate response to treatment, comfort care pathway started. Patient with some clinical improvement now, she wants to be treated for treatable. Followed by palliative care. DNR/DNI. We will continue to monitor in the hospital for stability before transferring to a nursing home.    DVT prophylaxis:  rivaroxaban (XARELTO) tablet 20 mg   Code Status: DNR/DNI Family Communication: Called patient's son on the phone, unable to reach him. Disposition Plan: Status is: Inpatient  Remains inpatient appropriate because:Inpatient level of care appropriate due to severity of illness   Dispo: The patient is from: SNF              Anticipated d/c is to: SNF  Anticipated d/c date is: 2 days              Patient currently is not medically stable to d/c.         Consultants:   Cardiology  Palliative medicine  Infectious disease, curbside  Procedures:   None  Antimicrobials:  Anti-infectives (From admission, onward)   Start      Dose/Rate Route Frequency Ordered Stop   04/01/20 1000  cefTRIAXone (ROCEPHIN) 2 g in sodium chloride 0.9 % 100 mL IVPB        2 g 200 mL/hr over 30 Minutes Intravenous Every 24 hours 03/31/20 1205     03/31/20 1300  cefTRIAXone (ROCEPHIN) 1 g in sodium chloride 0.9 % 100 mL IVPB        1 g 200 mL/hr over 30 Minutes Intravenous  Once 03/31/20 1206 03/31/20 1444   03/31/20 1015  cefTRIAXone (ROCEPHIN) 1 g in sodium chloride 0.9 % 100 mL IVPB  Status:  Discontinued        1 g 200 mL/hr over 30 Minutes Intravenous Every 24 hours 03/31/20 0928 03/31/20 1205   03/31/20 1015  azithromycin (ZITHROMAX) 500 mg in sodium chloride 0.9 % 250 mL IVPB  Status:  Discontinued        500 mg 250 mL/hr over 60 Minutes Intravenous Every 24 hours 03/31/20 0928 03/31/20 1204   03/30/20 0130  cefTRIAXone (ROCEPHIN) 1 g in sodium chloride 0.9 % 100 mL IVPB        1 g 200 mL/hr over 30 Minutes Intravenous  Once 03/30/20 0121 03/30/20 0227   03/30/20 0130  azithromycin (ZITHROMAX) 500 mg in sodium chloride 0.9 % 250 mL IVPB        500 mg 250 mL/hr over 60 Minutes Intravenous  Once 03/30/20 0121 03/30/20 [id number]         Subjective: Patient seen and examined.  Early morning she was eating breakfast.  No overnight events.  Her heart rate will fluctuate, however mostly remains less than 130.  Denies any chest pain or palpitation. Went back to examine patient, she has no complaints.  She did not quite understand why she was so sick.  She does not remember how she landed up in the hospital. Patient remembers not feeling well very next day of her dental surgery where she had 3 teeth removed and gum stitched.  Objective: Vitals:   04/01/20 1000 04/01/20 1100 04/01/20 1129 04/01/20 1200  BP:  (!) 98/48  (!) 80/58  Pulse: (!) 128 80  76  Resp: (!) 30 18  16   Temp:   98 F (36.7 C)   TempSrc:   Oral   SpO2: 94% 93%  94%  Weight:      Height:        Intake/Output Summary (Last 24 hours) at 04/01/2020 1356 Last  data filed at 03/31/2020 1844 Gross per 24 hour  Intake 207.58 ml  Output 250 ml  Net -42.42 ml   Filed Weights   03/30/20 0029 03/31/20 1749  Weight: 81.6 kg 89.7 kg    Examination:  General exam: Appears calm and comfortable  Currently on room air. She was eating her meal and was comfortable. Respiratory system: Clear to auscultation. Respiratory effort normal.  No added sounds. Cardiovascular system: S1 & S2 heard, irregularly irregular. Gastrointestinal system: Soft.  Nondistended.  Bowel sounds present.   Foley catheter with clear urine.   Central nervous system: Alert and oriented. No focal neurological deficits. Extremities: Symmetric 5 x  5 power. Left calf with swelling and receding erythema.   Data Reviewed: I have personally reviewed following labs and imaging studies  CBC: Recent Labs  Lab 03/30/20 0035 03/30/20 0900 03/31/20 1014 04/01/20 0218  WBC 21.0* 32.6* 14.8* 11.2*  NEUTROABS 18.4*  --   --   --   HGB 12.5 10.6* 11.2* 10.9*  HCT 40.9 36.6 38.5 36.6  MCV 86.5 89.7 89.7 88.2  PLT 358 294 205 0000000   Basic Metabolic Panel: Recent Labs  Lab 03/30/20 0035 03/30/20 0716 03/31/20 1014 04/01/20 0218 04/01/20 1247  NA 138 136 137 139  --   K 3.8 3.7 4.4 3.8  --   CL 100 100 103 105  --   CO2 23 23 22 23   --   GLUCOSE 133* 163* 155* 202*  --   BUN 23 21 16 17   --   CREATININE 1.05* 1.03* 0.93 0.78  --   CALCIUM 9.5 8.6* 8.2* 8.6*  --   MG 1.4*  --   --   --  2.0  PHOS 2.4*  --   --   --   --    GFR: Estimated Creatinine Clearance: 69.8 mL/min (by C-G formula based on SCr of 0.78 mg/dL). Liver Function Tests: Recent Labs  Lab 03/30/20 0035 03/31/20 1014 04/01/20 0218  AST 18 42* 26  ALT 12 9 15   ALKPHOS 90 69 77  BILITOT 0.8 1.6* 1.0  PROT 7.5 6.0* 6.4*  ALBUMIN 3.6 2.7* 2.8*   No results for input(s): LIPASE, AMYLASE in the last 168 hours. No results for input(s): AMMONIA in the last 168 hours. Coagulation Profile: Recent Labs  Lab  03/30/20 0035  INR 1.5*   Cardiac Enzymes: No results for input(s): CKTOTAL, CKMB, CKMBINDEX, TROPONINI in the last 168 hours. BNP (last 3 results) No results for input(s): PROBNP in the last 8760 hours. HbA1C: Recent Labs    04/01/20 0218  HGBA1C 6.9*   CBG: Recent Labs  Lab 03/31/20 2105 04/01/20 0815 04/01/20 1132  GLUCAP 167* 176* 205*   Lipid Profile: No results for input(s): CHOL, HDL, LDLCALC, TRIG, CHOLHDL, LDLDIRECT in the last 72 hours. Thyroid Function Tests: No results for input(s): TSH, T4TOTAL, FREET4, T3FREE, THYROIDAB in the last 72 hours. Anemia Panel: No results for input(s): VITAMINB12, FOLATE, FERRITIN, TIBC, IRON, RETICCTPCT in the last 72 hours. Sepsis Labs: Recent Labs  Lab 03/30/20 0630 03/30/20 0900 03/31/20 1015 03/31/20 1434  LATICACIDVEN 2.7* 3.0* 1.6 1.7    Recent Results (from the past 240 hour(s))  Blood culture (routine single)     Status: Abnormal   Collection Time: 03/30/20 12:35 AM   Specimen: BLOOD RIGHT FOREARM  Result Value Ref Range Status   Specimen Description   Final    BLOOD RIGHT FOREARM Performed at [facility], [address], [place], [place] [address]    Special Requests   Final    BOTTLES DRAWN AEROBIC AND ANAEROBIC Blood Culture adequate volume Performed at [facility], [address] [place], [place] [address]    Culture  Setup Time   Final    GRAM POSITIVE COCCI IN CHAINS Gram Stain Report Called to,Read Back By and Verified With: HOLCOMBE,R AT [id number] ON 03/30/20 BY HUFFINES,S IN BOTH AEROBIC AND ANAEROBIC BOTTLES CRITICAL RESULT CALLED TO, READ BACK BY AND VERIFIED WITH: Sharyon Cable RN 03/30/20 AT [id number] Performed at [facility], [place] [address], [place], [place] [address]    Culture GROUP B STREP(S.AGALACTIAE)ISOLATED (A)  Final   Report Status 04/01/2020 FINAL  Final   Organism ID, Bacteria GROUP B STREP(S.AGALACTIAE)ISOLATED  Final      Susceptibility   Group b strep(s.agalactiae)isolated - MIC*     CLINDAMYCIN RESISTANT Resistant     AMPICILLIN <=0.25 SENSITIVE Sensitive     ERYTHROMYCIN 2 RESISTANT Resistant     VANCOMYCIN 0.5 SENSITIVE Sensitive     LEVOFLOXACIN 1 SENSITIVE Sensitive     PENICILLIN Value in next row Sensitive      SENSITIVE0.12    * GROUP B STREP(S.AGALACTIAE)ISOLATED  Blood Culture ID Panel (Reflexed)     Status: Abnormal   Collection Time: 03/30/20 12:35 AM  Result Value Ref Range Status   Enterococcus faecalis NOT DETECTED NOT DETECTED Final   Enterococcus Faecium NOT DETECTED NOT DETECTED Final   Listeria monocytogenes NOT DETECTED NOT DETECTED Final   Staphylococcus species NOT DETECTED NOT DETECTED Final   Staphylococcus aureus (BCID) NOT DETECTED NOT DETECTED Final   Staphylococcus epidermidis NOT DETECTED NOT DETECTED Final   Staphylococcus lugdunensis NOT DETECTED NOT DETECTED Final   Streptococcus species DETECTED (A) NOT DETECTED Final    Comment: CRITICAL RESULT CALLED TO, READ BACK BY AND VERIFIED WITH: Helene Kelp RN 03/30/20 AT 1808 SK    Streptococcus agalactiae DETECTED (A) NOT DETECTED Final    Comment: CRITICAL RESULT CALLED TO, READ BACK BY AND VERIFIED WITH: Helene Kelp RN 03/30/20 AT 1808 SK    Streptococcus pneumoniae NOT DETECTED NOT DETECTED Final   Streptococcus pyogenes NOT DETECTED NOT DETECTED Final   A.calcoaceticus-baumannii NOT DETECTED NOT DETECTED Final   Bacteroides fragilis NOT DETECTED NOT DETECTED Final   Enterobacterales NOT DETECTED NOT DETECTED Final   Enterobacter cloacae complex NOT DETECTED NOT DETECTED Final   Escherichia coli NOT DETECTED NOT DETECTED Final   Klebsiella aerogenes NOT DETECTED NOT DETECTED Final   Klebsiella oxytoca NOT DETECTED NOT DETECTED Final   Klebsiella pneumoniae NOT DETECTED NOT DETECTED Final   Proteus species NOT DETECTED NOT DETECTED Final   Salmonella species NOT DETECTED NOT DETECTED Final   Serratia marcescens NOT DETECTED NOT DETECTED Final   Haemophilus influenzae NOT  DETECTED NOT DETECTED Final   Neisseria meningitidis NOT DETECTED NOT DETECTED Final   Pseudomonas aeruginosa NOT DETECTED NOT DETECTED Final   Stenotrophomonas maltophilia NOT DETECTED NOT DETECTED Final   Candida albicans NOT DETECTED NOT DETECTED Final   Candida auris NOT DETECTED NOT DETECTED Final   Candida glabrata NOT DETECTED NOT DETECTED Final   Candida krusei NOT DETECTED NOT DETECTED Final   Candida parapsilosis NOT DETECTED NOT DETECTED Final   Candida tropicalis NOT DETECTED NOT DETECTED Final   Cryptococcus neoformans/gattii NOT DETECTED NOT DETECTED Final    Comment: Performed at [facility], [address], [place], [place] [address]  Resp Panel by RT-PCR (Flu A&B, Covid) Nasopharyngeal Swab     Status: None   Collection Time: 03/30/20 12:39 AM   Specimen: Nasopharyngeal Swab; Nasopharyngeal(NP) swabs in vial transport medium  Result Value Ref Range Status   SARS Coronavirus 2 by RT PCR NEGATIVE NEGATIVE Final    Comment: (NOTE) SARS-CoV-2 target nucleic acids are NOT DETECTED.  The SARS-CoV-2 RNA is generally detectable in upper respiratory specimens during the acute phase of infection. The lowest concentration of SARS-CoV-2 viral copies this assay can detect is 138 copies/mL. A negative result does not preclude SARS-Cov-2 infection and should not be used as the sole basis for treatment or other patient management decisions. A negative result may  occur with  improper specimen collection/handling, submission of specimen other than nasopharyngeal swab, presence of viral mutation(s) within the areas targeted by this assay, and inadequate number of viral copies(<138 copies/mL). A negative result must be combined with clinical observations, patient history, and epidemiological information. The expected result is Negative.  Fact Sheet for Patients:  EntrepreneurPulse.com.au  Fact Sheet for Healthcare Providers:   IncredibleEmployment.be  This test is no t yet approved or cleared by the [place] FDA and  has been authorized for detection and/or diagnosis of SARS-CoV-2 by FDA under an Emergency Use Authorization (EUA). This EUA will remain  in effect (meaning this test can be used) for the duration of the COVID-19 declaration under Section 564(b)(1) of the Act, 21 U.S.C.section 360bbb-3(b)(1), unless the authorization is terminated  or revoked sooner.       Influenza A by PCR NEGATIVE NEGATIVE Final   Influenza B by PCR NEGATIVE NEGATIVE Final    Comment: (NOTE) The Xpert Xpress SARS-CoV-2/FLU/RSV plus assay is intended as an aid in the diagnosis of influenza from Nasopharyngeal swab specimens and should not be used as a sole basis for treatment. Nasal washings and aspirates are unacceptable for Xpert Xpress SARS-CoV-2/FLU/RSV testing.  Fact Sheet for Patients: EntrepreneurPulse.com.au  Fact Sheet for Healthcare Providers: IncredibleEmployment.be  This test is not yet approved or cleared by the [place] FDA and has been authorized for detection and/or diagnosis of SARS-CoV-2 by FDA under an Emergency Use Authorization (EUA). This EUA will remain in effect (meaning this test can be used) for the duration of the COVID-19 declaration under Section 564(b)(1) of the Act, 21 U.S.C. section 360bbb-3(b)(1), unless the authorization is terminated or revoked.  Performed at [facility], [address], [place], [place] [address]   Urine culture     Status: Abnormal   Collection Time: 03/30/20 12:51 AM   Specimen: In/Out Cath Urine  Result Value Ref Range Status   Specimen Description   Final    IN/OUT CATH URINE Performed at [facility], [address], [place], [place] [address]    Special Requests   Final    NONE Performed at [facility], [address] [place], [place] [address]    Culture (A)  Final    <10,000  COLONIES/mL INSIGNIFICANT GROWTH Performed at [facility] [address], [place], [place] [address]    Report Status 03/31/2020 FINAL  Final  MRSA PCR Screening     Status: None   Collection Time: 03/31/20  6:13 PM   Specimen: Nasopharyngeal  Result Value Ref Range Status   MRSA by PCR NEGATIVE NEGATIVE Final    Comment:        The GeneXpert MRSA Assay (FDA approved for NASAL specimens only), is one component of a comprehensive MRSA colonization surveillance program. It is not intended to diagnose MRSA infection nor to guide or monitor treatment for MRSA infections. Performed at [facility] [address] [place], [place] [address]   Culture, blood (routine x 2)     Status: None (Preliminary result)   Collection Time: 03/31/20  7:50 PM   Specimen: BLOOD LEFT HAND  Result Value Ref Range Status   Specimen Description BLOOD LEFT HAND  Final   Special Requests   Final    BOTTLES DRAWN AEROBIC AND ANAEROBIC Blood Culture adequate volume Performed at [facility], [address] [place], [place] [address]    Culture PENDING  Incomplete   Report Status PENDING  Incomplete  Culture, blood (routine  x 2)     Status: None (Preliminary result)   Collection Time: 03/31/20  7:58 PM   Specimen: BLOOD LEFT WRIST  Result Value Ref Range Status   Specimen Description BLOOD LEFT WRIST  Final   Special Requests   Final    BOTTLES DRAWN AEROBIC AND ANAEROBIC Blood Culture adequate volume Performed at [facility], [address], [place], [place] [address]    Culture PENDING  Incomplete   Report Status PENDING  Incomplete         Radiology Studies: US Venous Img Lower Unilateral Left (DVT)  Result Date: 04/01/2020 CLINICAL DATA:  74 year old female with a history of erythema EXAM: LEFT LOWER EXTREMITY VENOUS DOPPLER ULTRASOUND TECHNIQUE: Gray-scale sonography with graded compression, as well as color Doppler and duplex ultrasound were performed to evaluate the lower  extremity deep venous systems from the level of the common femoral vein and including the common femoral, femoral, profunda femoral, popliteal and calf veins including the posterior tibial, peroneal and gastrocnemius veins when visible. The superficial great saphenous vein was also interrogated. Spectral Doppler was utilized to evaluate flow at rest and with distal augmentation maneuvers in the common femoral, femoral and popliteal veins. COMPARISON:  None. FINDINGS: Contralateral Common Femoral Vein: Respiratory phasicity is normal and symmetric with the symptomatic side. No evidence of thrombus. Normal compressibility. Common Femoral Vein: No evidence of thrombus. Normal compressibility, respiratory phasicity and response to augmentation. Saphenofemoral Junction: No evidence of thrombus. Normal compressibility and flow on color Doppler imaging. Profunda Femoral Vein: No evidence of thrombus. Normal compressibility and flow on color Doppler imaging. Femoral Vein: No evidence of thrombus. Normal compressibility, respiratory phasicity and response to augmentation. Popliteal Vein: No evidence of thrombus. Normal compressibility, respiratory phasicity and response to augmentation. Calf Veins: No evidence of thrombus. Normal compressibility and flow on color Doppler imaging. Superficial Great Saphenous Vein: No evidence of thrombus. Normal compressibility and flow on color Doppler imaging. Other Findings:  None. IMPRESSION: Sonographic survey of the left lower extremity negative for DVT Electronically Signed   By: Corrie Mckusick D.O.   On: 04/01/2020 13:13   DG CHEST PORT 1 VIEW  Result Date: 03/31/2020 CLINICAL DATA:  Lethargic unresponsive EXAM: PORTABLE CHEST 1 VIEW COMPARISON:  10/22/2019, 03/04/2018 FINDINGS: Enlarged cardiomediastinal silhouette. Increased airspace disease at the left lung base compared to prior. No pleural effusion. No pneumothorax. IMPRESSION: Increased airspace disease at the left lung base  may reflect atelectasis or pneumonia. Stable cardiomegaly. Electronically Signed   By: Donavan Foil M.D.   On: 03/31/2020 19:07        Scheduled Meds: . atorvastatin  20 mg Oral Daily  . buPROPion  100 mg Oral BID  . cycloSPORINE  1 drop Both Eyes BID  . diltiazem  240 mg Oral Daily  . gabapentin  300 mg Oral TID  . hydrocortisone sod succinate (SOLU-CORTEF) inj  50 mg Intravenous Q12H  . insulin aspart  0-15 Units Subcutaneous TID WC  . insulin aspart  0-5 Units Subcutaneous QHS  . mouth rinse  15 mL Mouth Rinse BID  . metoprolol tartrate  50 mg Oral TID  . mometasone-formoterol  2 puff Inhalation BID  . pantoprazole  40 mg Oral Daily  . polyvinyl alcohol  2 drop Both Eyes q AM  . Rivaroxaban  20 mg Oral Q breakfast  . senna  1 tablet Oral Daily   Continuous Infusions: . sodium chloride Stopped (03/31/20 1148)  . cefTRIAXone (ROCEPHIN)  IV 2 g (04/01/20 1007)  LOS: 2 days    Time spent: 35 minutes    Barb Merino, MD [facility] Pager [phone]

## 2020-04-01 NOTE — Progress Notes (Signed)
ANTICOAGULATION CONSULT NOTE -   Pharmacy Consult for Heparin Indication: atrial fibrillation  Allergies  Allergen Reactions  . Promethazine     Patient Measurements: Height: 5\' 7"  (170.2 cm) Weight: 89.7 kg (197 lb 12 oz) IBW/kg (Calculated) : 61.6 Heparin Dosing Weight: 80.6kg  Vital Signs: BP: 125/80 (12/29 0600) Pulse Rate: 105 (12/29 0700)  Labs: Recent Labs    03/30/20 0035 03/30/20 0217 03/30/20 0716 03/30/20 0900 03/31/20 1014 04/01/20 0218 04/01/20 0551  HGB 12.5  --   --  10.6* 11.2* 10.9*  --   HCT 40.9  --   --  36.6 38.5 36.6  --   PLT 358  --   --  294 205 258  --   APTT 30  --   --   --   --   --  85*  LABPROT 17.6*  --   --   --   --   --   --   INR 1.5*  --   --   --   --   --   --   HEPARINUNFRC  --   --   --   --   --   --  0.34  CREATININE 1.05*  --  1.03*  --  0.93 0.78  --   TROPONINIHS 6 7  --   --   --   --   --     Estimated Creatinine Clearance: 69.8 mL/min (by C-G formula based on SCr of 0.78 mg/dL).   Medical History: Past Medical History:  Diagnosis Date  . Abnormal LFTs 07/24/2017  . Actinic keratosis   . Anemia    hx of  . Anemia of chronic renal failure, stage 3 (moderate) (HCC) 07/17/2015  . Anticoagulant causing adverse effect in therapeutic use 07/17/2015  . Anxiety   . Arthritis    rheumatoid  . Atrial fibrillation (HCC)    a. persistent, she remains on Xarelto  . Carpal tunnel syndrome   . Chronic diastolic (congestive) heart failure (HCC)    a. 04/2016: echo showing a preserved EF of 55-60% with mild MR. LA and RA midly dilated.   . Edema    left leg at ankle resolved now  . Gastroesophageal reflux disease   . Hepatitis 1970   not sure what kind  . Hiatal hernia   . Hyperlipidemia   . Hypertension   . Iron deficiency anemia due to chronic blood loss 07/17/2015  . Jaundice    age 48  . Peripheral vascular disease (HCC) yrs ago   DVT left lower leg questionale told by 2 drs i had no clot, 1 md said i did  .  Rheumatoid arthritis(714.0)   . Right knee pain   . Stroke (HCC)   . Type II diabetes mellitus (HCC)    type2    Assessment: 74 yo W on rivaroxaban 10mg  daily for afib now held. Last dose 12/26 @ 1600. Patient is in afib RVR in the setting of sepsis. Pharmacy is consulted for heparin.   H/H 11.2, plt stable. ClCr ~60 ml/min, unclear why on reduced dose rivaroxaban.  HL 0.34- therapeutic APTT 88- therapeutic  Will dose based on heparin levels only now.  Goal of Therapy:  Heparin level 0.3-0.7 units/ml  Monitor platelets by anticoagulation protocol: Yes   Plan:   Continue heparin infusion at 1200 units/hr F/u 6hr HL and daily Monitor for signs/symptoms of bleeding    , PharmD Clinical Pharmacist 04/01/2020 8:14 AM

## 2020-04-01 NOTE — Plan of Care (Signed)
  Problem: Acute Rehab PT Goals(only PT should resolve) Goal: Pt Will Go Supine/Side To Sit Outcome: Progressing Flowsheets (Taken 04/01/2020 1552) Pt will go Supine/Side to Sit:  Independently  with modified independence Goal: Patient Will Transfer Sit To/From Stand Outcome: Progressing Flowsheets (Taken 04/01/2020 1552) Patient will transfer sit to/from stand:  with supervision  with modified independence Goal: Pt Will Transfer Bed To Chair/Chair To Bed Outcome: Progressing Flowsheets (Taken 04/01/2020 1552) Pt will Transfer Bed to Chair/Chair to Bed:  with supervision  with modified independence Goal: Pt Will Ambulate Outcome: Progressing Flowsheets (Taken 04/01/2020 1552) Pt will Ambulate:  75 feet  with supervision  with rolling walker   3:52 PM, 04/01/20 [name], MPT Physical Therapist with [facility] [phone] office [phone] mobile phone

## 2020-04-01 NOTE — Progress Notes (Signed)
*  PRELIMINARY RESULTS* Echocardiogram 2D Echocardiogram has been performed.  Stacey Drain 04/01/2020, 3:49 PM

## 2020-04-01 NOTE — Progress Notes (Addendum)
Progress Note  Patient Name: [name] Date of Encounter: 04/01/2020  [facility] Cardiologist: Olga Millers, MD   Subjective   Breathing is OK  No CP    Inpatient Medications    Scheduled Meds: . atorvastatin  20 mg Oral Daily  . buPROPion  100 mg Oral BID  . cycloSPORINE  1 drop Both Eyes BID  . diltiazem  240 mg Oral Daily  . gabapentin  300 mg Oral TID  . hydrocortisone sod succinate (SOLU-CORTEF) inj  50 mg Intravenous Q12H  . insulin aspart  0-15 Units Subcutaneous TID WC  . insulin aspart  0-5 Units Subcutaneous QHS  . mouth rinse  15 mL Mouth Rinse BID  . metoprolol tartrate  100 mg Oral TID  . mometasone-formoterol  2 puff Inhalation BID  . pantoprazole  40 mg Oral Daily  . polyvinyl alcohol  2 drop Both Eyes q AM  . Rivaroxaban  20 mg Oral Q breakfast  . senna  1 tablet Oral Daily   Continuous Infusions: . sodium chloride Stopped (03/31/20 1148)  . cefTRIAXone (ROCEPHIN)  IV 2 g (04/01/20 1007)  . diltiazem (CARDIZEM) infusion 5 mg/hr (04/01/20 1020)   PRN Meds: acetaminophen **OR** acetaminophen, albuterol, antiseptic oral rinse, HYDROcodone-acetaminophen, LORazepam **OR** [DISCONTINUED] LORazepam **OR** LORazepam, morphine injection, ondansetron **OR** ondansetron (ZOFRAN) IV, polyvinyl alcohol   Vital Signs    Vitals:   04/01/20 0700 04/01/20 0800 04/01/20 0816 04/01/20 0900  BP:  139/61  140/75  Pulse: (!) 105 (!) 120  96  Resp: (!) 26 20  (!) 28  Temp:   97.6 F (36.4 C)   TempSrc:   Oral   SpO2: 93% 95%  94%  Weight:      Height:        Intake/Output Summary (Last 24 hours) at 04/01/2020 1106 Last data filed at 03/31/2020 1844 Gross per 24 hour  Intake 207.58 ml  Output 250 ml  Net -42.42 ml   Last 3 Weights 03/31/2020 03/30/2020 08/24/2018  Weight (lbs) 197 lb 12 oz 179 lb 14.3 oz 180 lb  Weight (kg) 89.7 kg 81.6 kg 81.647 kg      Telemetry    Afib 100 t o120 - Personally Reviewed  ECG    No new  - Personally  Reviewed  Physical Exam   GEN: Pt in NAD  Awake, alert, talkative      Neck: No JVD Cardiac: Irreg irreg  No S3    Respiratory: Clrackles at R base   Otherwise CTA  . GI: Soft, nontender, non-distended  MS: Tirv edema L leg   Mild erythema skin L shin   Note small scabbed areas on 4 /5 toes  .  Neuro:  Nonfocal  Psych: Normal affect   Labs    High Sensitivity Troponin:   Recent Labs  Lab 03/30/20 0035 03/30/20 0217  TROPONINIHS 6 7      Chemistry Recent Labs  Lab 03/30/20 0035 03/30/20 0716 03/31/20 1014 04/01/20 0218  NA 138 136 137 139  K 3.8 3.7 4.4 3.8  CL 100 100 103 105  CO2 23 23 22 23   GLUCOSE 133* 163* 155* 202*  BUN 23 21 16 17   CREATININE 1.05* 1.03* 0.93 0.78  CALCIUM 9.5 8.6* 8.2* 8.6*  PROT 7.5  --  6.0* 6.4*  ALBUMIN 3.6  --  2.7* 2.8*  AST 18  --  42* 26  ALT 12  --  9 15  ALKPHOS 90  --  69 77  BILITOT 0.8  --  1.6* 1.0  GFRNONAA 55* 57* >60 >60  ANIONGAP 15 13 12 11      Hematology Recent Labs  Lab 03/30/20 0900 03/31/20 1014 04/01/20 0218  WBC 32.6* 14.8* 11.2*  RBC 4.08 4.29 4.15  HGB 10.6* 11.2* 10.9*  HCT 36.6 38.5 36.6  MCV 89.7 89.7 88.2  MCH 26.0 26.1 26.3  MCHC 29.0* 29.1* 29.8*  RDW 17.6* 17.8* 17.5*  PLT 294 205 258    BNPNo results for input(s): BNP, PROBNP in the last 168 hours.   DDimer No results for input(s): DDIMER in the last 168 hours.   Radiology    DG CHEST PORT 1 VIEW  Result Date: 03/31/2020 CLINICAL DATA:  Lethargic unresponsive EXAM: PORTABLE CHEST 1 VIEW COMPARISON:  10/22/2019, 03/04/2018 FINDINGS: Enlarged cardiomediastinal silhouette. Increased airspace disease at the left lung base compared to prior. No pleural effusion. No pneumothorax. IMPRESSION: Increased airspace disease at the left lung base may reflect atelectasis or pneumonia. Stable cardiomegaly. Electronically Signed   By: Donavan Foil M.D.   On: 03/31/2020 19:07    Cardiac Studies    Patient Profile  [name] is a 74 y.o.  female with past medical history of permanent atrial fibrillation (on Xarelto), history of GIB (occuring in 2017 secondary to AVM's), chronic diastolic CHF, HTN, HLD, Stage 3 CKD and prior CVA who is being seen today for the evaluation of atrial fibrillation with RVR at the request of Dr. Olevia Bowens.   Assessment & Plan    Pt is significantly improved from Monday   Suspicious that sepsis related to recent tooth extraction last week   She says she was feeling fine prior to 03/25/20  1.  Atrial fib   Rates relatively controlled Switching to PO meds   Follow   This patients CHA2DS2-VASc Score and unadjusted Ischemic Stroke Rate (% per year) is equal to 11.2 % stroke rate/year from a score of 7 (HTN, Vascular, Female, Age (2), CVA (2). She was on Xarelto prior to admission. Would recommend resuming Xarelto Diltiazem PO has been started Note metoprolol readded   I would pull back dosing a bit to make sure BP tolerates all of changes   50 tid      2  Acute on chronic diastllic CHF   Pt did get volume with sepsis Rx   I would give 1 dose IV lasix and follow response/exam.      3  Sepsis  Markedly improved than on Monday   Continues on IV Abx        For questions or updates, please contact [facility] Please consult [email or web address] for contact info under        Signed, Dorris Carnes, MD  04/01/2020, 11:06 AM

## 2020-04-01 NOTE — Progress Notes (Signed)
Palliative: [name], [name], is sitting up in the bed in her room.  She greets me making and mostly keeping eye contact.  She is alert and oriented, looks much improved from yesterday afternoon.  She is very pleasant.  She is able to make her needs known.  There is no family at bedside at this time.    We talked about her diagnosis of bacteremia, blood infection.  We talked about further testing to be done to determine length of treatment plan.  At this point, [name] is agreeable to further testing.  Conference with attending and bedside nursing staff related to patient condition, needs, goals of care.  Plan: Continue to treat the treatable but no CPR or intubation.  Return to [facility] under long-term care when appropriate. At this point continue to rehospitalize as needed.  25 minutes Christina Carmel, NP Palliative medicine team Team phone [phone] Greater than 50% of this time was spent counseling and coordinating care related to the above assessment and plan.

## 2020-04-02 DIAGNOSIS — A419 Sepsis, unspecified organism: Secondary | ICD-10-CM | POA: Diagnosis not present

## 2020-04-02 LAB — CBC
HCT: 33.2 % — ABNORMAL LOW (ref 36.0–46.0)
Hemoglobin: 9.9 g/dL — ABNORMAL LOW (ref 12.0–15.0)
MCH: 26.7 pg (ref 26.0–34.0)
MCHC: 29.8 g/dL — ABNORMAL LOW (ref 30.0–36.0)
MCV: 89.5 fL (ref 80.0–100.0)
Platelets: 277 10*3/uL (ref 150–400)
RBC: 3.71 MIL/uL — ABNORMAL LOW (ref 3.87–5.11)
RDW: 17.3 % — ABNORMAL HIGH (ref 11.5–15.5)
WBC: 8.3 10*3/uL (ref 4.0–10.5)
nRBC: 0 % (ref 0.0–0.2)

## 2020-04-02 LAB — BASIC METABOLIC PANEL
Anion gap: 13 (ref 5–15)
BUN: 20 mg/dL (ref 8–23)
CO2: 23 mmol/L (ref 22–32)
Calcium: 8.8 mg/dL — ABNORMAL LOW (ref 8.9–10.3)
Chloride: 106 mmol/L (ref 98–111)
Creatinine, Ser: 0.94 mg/dL (ref 0.44–1.00)
GFR, Estimated: >60 mL/min (ref >60)
Glucose, Bld: 159 mg/dL — ABNORMAL HIGH (ref 70–99)
Potassium: 3.2 mmol/L — ABNORMAL LOW (ref 3.5–5.1)
Sodium: 142 mmol/L (ref 135–145)

## 2020-04-02 LAB — GLUCOSE, CAPILLARY
Glucose-Capillary: 215 mg/dL — ABNORMAL HIGH (ref 70–99)
Glucose-Capillary: 225 mg/dL — ABNORMAL HIGH (ref 70–99)
Glucose-Capillary: 148 mg/dL — ABNORMAL HIGH (ref 70–99)
Glucose-Capillary: 188 mg/dL — ABNORMAL HIGH (ref 70–99)

## 2020-04-02 LAB — SARS CORONAVIRUS 2 (TAT 6-24 HRS): SARS Coronavirus 2: NEGATIVE

## 2020-04-02 LAB — PHOSPHORUS: Phosphorus: 2.7 mg/dL (ref 2.5–4.6)

## 2020-04-02 LAB — MAGNESIUM: Magnesium: 1.9 mg/dL (ref 1.7–2.4)

## 2020-04-02 MED ORDER — METOPROLOL TARTRATE 50 MG PO TABS
100.0000 mg | ORAL_TABLET | Freq: Two times a day (BID) | ORAL | Status: DC
  Administered 2020-04-02 – 2020-04-03 (×2): 100 mg via ORAL
  Filled 2020-04-02 (×2): qty 2

## 2020-04-02 MED ORDER — INSULIN GLARGINE 100 UNIT/ML ~~LOC~~ SOLN
20.0000 [IU] | Freq: Every day | SUBCUTANEOUS | Status: DC
  Administered 2020-04-02 – 2020-04-03 (×2): 20 [IU] via SUBCUTANEOUS
  Filled 2020-04-02 (×6): qty 0.2

## 2020-04-02 MED ORDER — CHLORHEXIDINE GLUCONATE 0.12 % MT SOLN
15.0000 mL | Freq: Two times a day (BID) | OROMUCOSAL | Status: DC
  Administered 2020-04-02 – 2020-04-03 (×3): 15 mL via OROMUCOSAL
  Filled 2020-04-02 (×2): qty 15

## 2020-04-02 NOTE — Progress Notes (Signed)
[facility] PROGRESS NOTE    [name]  [id number] DOB: Apr 26, 1945 DOA: 03/30/2020 PCP: Aletha Halim., PA-C      Brief Narrative:  [name] is a 74 y.o. SNF-dwelling F with RA on prednisone, leflunamide and abatacept, Afib on Xarelto, IDDM, chronic anemia, dCHF, peripheral vascular disease and hx stroke who presented with AMS, sepsis.  In the ER, thought to have clinical evidence of cellulitis or pneumonia and was treated with broad spectrum antibiotics.  Paitent initially with poor response to therapy, made comfort measures, but then improved rapidly before antibiotics could be stopped.  Blood culture grew GBS, and patient relayed that her symptoms started shortly after a dental procedure.          Assessment & Plan:  Severe sepsis due to group B strep bacteremia Presented with encphalopathy, leukocytosis and tachypnea.  Found to have GBS bacteremia.  No embolic phenomena or clinical concern for endocarditis.  TTE negative for vegetations. -Continue ceftriaxone   RA On pred, CD80/86 inhibitor and leflunamide at baseline -Continue stress dose steroids one more day  Afib -Continue Xarelto -Continue metoprolol -Continue diltiazem  Diabetes with polyneuropathy Glucose poorly controlled -Continue SS corrections -Continue gabapentin -Resume home Lantus -Hold metformin  Anemia Stable relative to baseline  Acute on chronic diastolic CHF Peripheral vascular disease Cerebrovascular disease -Continue atorvastatin -Continue Xarelto  Mood disorder -Continue Bupropion  COPD No acute event -Continue ICS/LABA -Continue PPI  Hypokalemia Hypomagnesemia Mag normalized. -Supplement K  Left leg redness DVT study negative for DVT.  There is no cellulitis in the left leg.  There is a weird patch of depressed skin, but I am really unable to account for this.       Disposition: Status is: Inpatient  Remains inpatient appropriate  because:Unsafe d/c plan   Dispo: The patient is from: Home              Anticipated d/c is to: SNF              Anticipated d/c date is: 1 day              Patient currently is not medically stable to d/c.              MDM: The below labs and imaging reports were reviewed and summarized above.  Medication management as above.    DVT prophylaxis:  rivaroxaban (XARELTO) tablet 20 mg  Code Status: DNR Family Communication: Son by phone            Subjective: Patient is feeling substantially better.  No fever overnight, no confusion, no headache, no palpitations, no chest pain, no dyspnea.  Objective: Vitals:   04/02/20 0900 04/02/20 1000 04/02/20 1100 04/02/20 1119  BP:      Pulse: 69 (!) 116 93   Resp: (!) 22 19 18    Temp:    (!) 97.3 F (36.3 C)  TempSrc:    Oral  SpO2: 96% 97% 97%   Weight:      Height:        Intake/Output Summary (Last 24 hours) at 04/02/2020 1200 Last data filed at 04/02/2020 0400 Gross per 24 hour  Intake 331.69 ml  Output 1350 ml  Net -1018.31 ml   Filed Weights   03/30/20 0029 03/31/20 1749  Weight: 81.6 kg 89.7 kg    Examination: General appearance: Elderly adult female, alert and in no acute distress.  Sitting in recliner. HEENT: Anicteric, conjunctiva pink, lids and lashes normal.  No nasal deformity, discharge, epistaxis.  Lips moist, partially edentulous, without drainage from the sockets, oropharynx moist, no oral lesions, hearing slightly diminished.   Skin: Warm and dry.  No jaundice.  No suspicious rashes or lesions.  There is a patch of reddish, not indurated or painful skin in the left leg. Cardiac: Irregular, tachycardic, nl S1-S2, no murmurs appreciated.  Capillary refill is brisk.  JVP normal.  No LE edema.  Radial pulses 2+ and symmetric. Respiratory: Normal respiratory rate and rhythm.  CTAB without rales or wheezes. Abdomen: Abdomen soft.  No TTP or guarding. No ascites, distension, hepatosplenomegaly.    MSK: No deformities or effusions. Neuro: Awake and alert.  EOMI, moves all extremities with generalized weakness. Speech fluent.    Psych: Sensorium intact and responding to questions, attention normal. Affect normal.  Judgment and insight appear normal.    Data Reviewed: I have personally reviewed following labs and imaging studies:  CBC: Recent Labs  Lab 03/30/20 0035 03/30/20 0900 03/31/20 1014 04/01/20 0218 04/02/20 0514  WBC 21.0* 32.6* 14.8* 11.2* 8.3  NEUTROABS 18.4*  --   --   --   --   HGB 12.5 10.6* 11.2* 10.9* 9.9*  HCT 40.9 36.6 38.5 36.6 33.2*  MCV 86.5 89.7 89.7 88.2 89.5  PLT 358 294 205 258 [id number]   Basic Metabolic Panel: Recent Labs  Lab 03/30/20 0035 03/30/20 0716 03/31/20 1014 04/01/20 0218 04/01/20 1247 04/02/20 0514  NA 138 136 137 139  --  142  K 3.8 3.7 4.4 3.8  --  3.2*  CL 100 100 103 105  --  106  CO2 23 23 22 23   --  23  GLUCOSE 133* 163* 155* 202*  --  159*  BUN 23 21 16 17   --  20  CREATININE 1.05* 1.03* 0.93 0.78  --  0.94  CALCIUM 9.5 8.6* 8.2* 8.6*  --  8.8*  MG 1.4*  --   --   --  2.0 1.9  PHOS 2.4*  --   --   --   --  2.7   GFR: Estimated Creatinine Clearance: 59.4 mL/min (by C-G formula based on SCr of 0.94 mg/dL). Liver Function Tests: Recent Labs  Lab 03/30/20 0035 03/31/20 1014 04/01/20 0218  AST 18 42* 26  ALT 12 9 15   ALKPHOS 90 69 77  BILITOT 0.8 1.6* 1.0  PROT 7.5 6.0* 6.4*  ALBUMIN 3.6 2.7* 2.8*   No results for input(s): LIPASE, AMYLASE in the last 168 hours. No results for input(s): AMMONIA in the last 168 hours. Coagulation Profile: Recent Labs  Lab 03/30/20 0035  INR 1.5*   Cardiac Enzymes: No results for input(s): CKTOTAL, CKMB, CKMBINDEX, TROPONINI in the last 168 hours. BNP (last 3 results) No results for input(s): PROBNP in the last 8760 hours. HbA1C: Recent Labs    04/01/20 0218  HGBA1C 6.9*   CBG: Recent Labs  Lab 04/01/20 1132 04/01/20 1620 04/01/20 2233 04/02/20 0746 04/02/20 1123   GLUCAP 205* 157* 245* 215* 225*   Lipid Profile: No results for input(s): CHOL, HDL, LDLCALC, TRIG, CHOLHDL, LDLDIRECT in the last 72 hours. Thyroid Function Tests: No results for input(s): TSH, T4TOTAL, FREET4, T3FREE, THYROIDAB in the last 72 hours. Anemia Panel: No results for input(s): VITAMINB12, FOLATE, FERRITIN, TIBC, IRON, RETICCTPCT in the last 72 hours. Urine analysis:    Component Value Date/Time   COLORURINE YELLOW 03/30/2020 [place] 03/30/2020 0051   LABSPEC 1.020 03/30/2020 [place]  5.0 03/30/2020 0051   GLUCOSEU NEGATIVE 03/30/2020 0051   HGBUR NEGATIVE 03/30/2020 0051   BILIRUBINUR NEGATIVE 03/30/2020 0051   KETONESUR NEGATIVE 03/30/2020 0051   PROTEINUR 30 (A) 03/30/2020 0051   UROBILINOGEN 1.0 10/09/2014 0040   NITRITE NEGATIVE 03/30/2020 0051   LEUKOCYTESUR NEGATIVE 03/30/2020 0051   Sepsis Labs: @LABRCNTIP (procalcitonin:4,lacticacidven:4)  ) Recent Results (from the past 240 hour(s))  Blood culture (routine single)     Status: Abnormal   Collection Time: 03/30/20 12:35 AM   Specimen: BLOOD RIGHT FOREARM  Result Value Ref Range Status   Specimen Description   Final    BLOOD RIGHT FOREARM Performed at [facility], [address] [place], [place]    Special Requests   Final    BOTTLES DRAWN AEROBIC AND ANAEROBIC Blood Culture adequate volume Performed at [facility], [address], [place], [place]    Culture  Setup Time   Final    GRAM POSITIVE COCCI IN CHAINS Gram Stain Report Called to,Read Back By and Verified With: HOLCOMBE,R AT 1240 ON 03/30/20 BY HUFFINES,S IN BOTH AEROBIC AND ANAEROBIC BOTTLES CRITICAL RESULT CALLED TO, READ BACK BY AND VERIFIED WITH: 04/01/20 RN 03/30/20 AT 04/01/20 SK Performed at [facility], [address] [place], [place]    Culture GROUP B STREP(S.AGALACTIAE)ISOLATED (A)  Final   Report Status 04/01/2020 FINAL  Final   Organism ID, Bacteria GROUP B  STREP(S.AGALACTIAE)ISOLATED  Final      Susceptibility   Group b strep(s.agalactiae)isolated - MIC*    CLINDAMYCIN RESISTANT Resistant     AMPICILLIN <=0.25 SENSITIVE Sensitive     ERYTHROMYCIN 2 RESISTANT Resistant     VANCOMYCIN 0.5 SENSITIVE Sensitive     LEVOFLOXACIN 1 SENSITIVE Sensitive     PENICILLIN Value in next row Sensitive      SENSITIVE0.12    * GROUP B STREP(S.AGALACTIAE)ISOLATED  Blood Culture ID Panel (Reflexed)     Status: Abnormal   Collection Time: 03/30/20 12:35 AM  Result Value Ref Range Status   Enterococcus faecalis NOT DETECTED NOT DETECTED Final   Enterococcus Faecium NOT DETECTED NOT DETECTED Final   Listeria monocytogenes NOT DETECTED NOT DETECTED Final   Staphylococcus species NOT DETECTED NOT DETECTED Final   Staphylococcus aureus (BCID) NOT DETECTED NOT DETECTED Final   Staphylococcus epidermidis NOT DETECTED NOT DETECTED Final   Staphylococcus lugdunensis NOT DETECTED NOT DETECTED Final   Streptococcus species DETECTED (A) NOT DETECTED Final    Comment: CRITICAL RESULT CALLED TO, READ BACK BY AND VERIFIED WITH: 04/01/20 RN 03/30/20 AT 1808 SK    Streptococcus agalactiae DETECTED (A) NOT DETECTED Final    Comment: CRITICAL RESULT CALLED TO, READ BACK BY AND VERIFIED WITH: 04/01/20 RN 03/30/20 AT 1808 SK    Streptococcus pneumoniae NOT DETECTED NOT DETECTED Final   Streptococcus pyogenes NOT DETECTED NOT DETECTED Final   A.calcoaceticus-baumannii NOT DETECTED NOT DETECTED Final   Bacteroides fragilis NOT DETECTED NOT DETECTED Final   Enterobacterales NOT DETECTED NOT DETECTED Final   Enterobacter cloacae complex NOT DETECTED NOT DETECTED Final   Escherichia coli NOT DETECTED NOT DETECTED Final   Klebsiella aerogenes NOT DETECTED NOT DETECTED Final   Klebsiella oxytoca NOT DETECTED NOT DETECTED Final   Klebsiella pneumoniae NOT DETECTED NOT DETECTED Final   Proteus species NOT DETECTED NOT DETECTED Final   Salmonella species NOT DETECTED NOT  DETECTED Final   Serratia marcescens NOT DETECTED NOT DETECTED Final   Haemophilus influenzae NOT DETECTED NOT  DETECTED Final   Neisseria meningitidis NOT DETECTED NOT DETECTED Final   Pseudomonas aeruginosa NOT DETECTED NOT DETECTED Final   Stenotrophomonas maltophilia NOT DETECTED NOT DETECTED Final   Candida albicans NOT DETECTED NOT DETECTED Final   Candida auris NOT DETECTED NOT DETECTED Final   Candida glabrata NOT DETECTED NOT DETECTED Final   Candida krusei NOT DETECTED NOT DETECTED Final   Candida parapsilosis NOT DETECTED NOT DETECTED Final   Candida tropicalis NOT DETECTED NOT DETECTED Final   Cryptococcus neoformans/gattii NOT DETECTED NOT DETECTED Final    Comment: Performed at [facility], [place] [address], [place], [place] [address]  Resp Panel by RT-PCR (Flu A&B, Covid) Nasopharyngeal Swab     Status: None   Collection Time: 03/30/20 12:39 AM   Specimen: Nasopharyngeal Swab; Nasopharyngeal(NP) swabs in vial transport medium  Result Value Ref Range Status   SARS Coronavirus 2 by RT PCR NEGATIVE NEGATIVE Final    Comment: (NOTE) SARS-CoV-2 target nucleic acids are NOT DETECTED.  The SARS-CoV-2 RNA is generally detectable in upper respiratory specimens during the acute phase of infection. The lowest concentration of SARS-CoV-2 viral copies this assay can detect is 138 copies/mL. A negative result does not preclude SARS-Cov-2 infection and should not be used as the sole basis for treatment or other patient management decisions. A negative result may occur with  improper specimen collection/handling, submission of specimen other than nasopharyngeal swab, presence of viral mutation(s) within the areas targeted by this assay, and inadequate number of viral copies(<138 copies/mL). A negative result must be combined with clinical observations, patient history, and epidemiological information. The expected result is Negative.  Fact Sheet for Patients:   EntrepreneurPulse.com.au  Fact Sheet for Healthcare Providers:  IncredibleEmployment.be  This test is no t yet approved or cleared by the [place] FDA and  has been authorized for detection and/or diagnosis of SARS-CoV-2 by FDA under an Emergency Use Authorization (EUA). This EUA will remain  in effect (meaning this test can be used) for the duration of the COVID-19 declaration under Section 564(b)(1) of the Act, 21 U.S.C.section 360bbb-3(b)(1), unless the authorization is terminated  or revoked sooner.       Influenza A by PCR NEGATIVE NEGATIVE Final   Influenza B by PCR NEGATIVE NEGATIVE Final    Comment: (NOTE) The Xpert Xpress SARS-CoV-2/FLU/RSV plus assay is intended as an aid in the diagnosis of influenza from Nasopharyngeal swab specimens and should not be used as a sole basis for treatment. Nasal washings and aspirates are unacceptable for Xpert Xpress SARS-CoV-2/FLU/RSV testing.  Fact Sheet for Patients: EntrepreneurPulse.com.au  Fact Sheet for Healthcare Providers: IncredibleEmployment.be  This test is not yet approved or cleared by the [place] FDA and has been authorized for detection and/or diagnosis of SARS-CoV-2 by FDA under an Emergency Use Authorization (EUA). This EUA will remain in effect (meaning this test can be used) for the duration of the COVID-19 declaration under Section 564(b)(1) of the Act, 21 U.S.C. section 360bbb-3(b)(1), unless the authorization is terminated or revoked.  Performed at [facility], [address] [place], [place] [address]   Urine culture     Status: Abnormal   Collection Time: 03/30/20 12:51 AM   Specimen: In/Out Cath Urine  Result Value Ref Range Status   Specimen Description   Final    IN/OUT CATH URINE Performed at [facility], [address], [place], [place] [address]    Special Requests   Final    NONE Performed at [facility]  [address] [place], [place] [address]    Culture (A)  Final    <10,000 COLONIES/mL INSIGNIFICANT GROWTH Performed at [place] [address] [place], [place] [address]    Report Status 03/31/2020 FINAL  Final  MRSA PCR Screening     Status: None   Collection Time: 03/31/20  6:13 PM   Specimen: Nasopharyngeal  Result Value Ref Range Status   MRSA by PCR NEGATIVE NEGATIVE Final    Comment:        The GeneXpert MRSA Assay (FDA approved for NASAL specimens only), is one component of a comprehensive MRSA colonization surveillance program. It is not intended to diagnose MRSA infection nor to guide or monitor treatment for MRSA infections. Performed at [facility], [address] [place], [place] [address]   Culture, blood (routine x 2)     Status: None (Preliminary result)   Collection Time: 03/31/20  7:50 PM   Specimen: BLOOD LEFT HAND  Result Value Ref Range Status   Specimen Description BLOOD LEFT HAND  Final   Special Requests   Final    BOTTLES DRAWN AEROBIC AND ANAEROBIC Blood Culture adequate volume Performed at [facility], [address], [place], [place] [address]    Culture PENDING  Incomplete   Report Status PENDING  Incomplete  Culture, blood (routine x 2)     Status: None (Preliminary result)   Collection Time: 03/31/20  7:58 PM   Specimen: BLOOD LEFT WRIST  Result Value Ref Range Status   Specimen Description BLOOD LEFT WRIST  Final   Special Requests   Final    BOTTLES DRAWN AEROBIC AND ANAEROBIC Blood Culture adequate volume Performed at [facility], [address], [place], [place] [address]    Culture PENDING  Incomplete   Report Status PENDING  Incomplete         Radiology Studies: US Venous Img Lower Unilateral Left (DVT)  Result Date: 04/01/2020 CLINICAL DATA:  74 year old female with a history of erythema EXAM: LEFT LOWER EXTREMITY VENOUS DOPPLER ULTRASOUND TECHNIQUE: Gray-scale sonography with graded compression, as well as  color Doppler and duplex ultrasound were performed to evaluate the lower extremity deep venous systems from the level of the common femoral vein and including the common femoral, femoral, profunda femoral, popliteal and calf veins including the posterior tibial, peroneal and gastrocnemius veins when visible. The superficial great saphenous vein was also interrogated. Spectral Doppler was utilized to evaluate flow at rest and with distal augmentation maneuvers in the common femoral, femoral and popliteal veins. COMPARISON:  None. FINDINGS: Contralateral Common Femoral Vein: Respiratory phasicity is normal and symmetric with the symptomatic side. No evidence of thrombus. Normal compressibility. Common Femoral Vein: No evidence of thrombus. Normal compressibility, respiratory phasicity and response to augmentation. Saphenofemoral Junction: No evidence of thrombus. Normal compressibility and flow on color Doppler imaging. Profunda Femoral Vein: No evidence of thrombus. Normal compressibility and flow on color Doppler imaging. Femoral Vein: No evidence of thrombus. Normal compressibility, respiratory phasicity and response to augmentation. Popliteal Vein: No evidence of thrombus. Normal compressibility, respiratory phasicity and response to augmentation. Calf Veins: No evidence of thrombus. Normal compressibility and flow on color Doppler imaging. Superficial Great Saphenous Vein: No evidence of thrombus. Normal compressibility and flow on color Doppler imaging. Other Findings:  None. IMPRESSION: Sonographic survey of the left lower extremity negative for DVT Electronically Signed   By: Corrie Mckusick D.O.   On: 04/01/2020 13:13   DG CHEST PORT 1 VIEW  Result  Date: 03/31/2020 CLINICAL DATA:  Lethargic unresponsive EXAM: PORTABLE CHEST 1 VIEW COMPARISON:  10/22/2019, 03/04/2018 FINDINGS: Enlarged cardiomediastinal silhouette. Increased airspace disease at the left lung base compared to prior. No pleural effusion. No  pneumothorax. IMPRESSION: Increased airspace disease at the left lung base may reflect atelectasis or pneumonia. Stable cardiomegaly. Electronically Signed   By: Jasmine Pang M.D.   On: 03/31/2020 19:07   ECHOCARDIOGRAM COMPLETE  Result Date: 04/01/2020    ECHOCARDIOGRAM REPORT   Patient Name:   VONDA RIZO Date of Exam: 04/01/2020 Medical Rec #:  [id number]      Height:       67.0 in Accession #:    [id number]     Weight:       197.8 lb Date of Birth:  1946-02-20      BSA:          2.013 m Patient Age:    74 years       BP:           80/58 mmHg Patient Gender: F              HR:           75 bpm. [facility] Procedure: 2D Echo, Cardiac Doppler and Color Doppler Indications:    Bacteremia R78.81  History:        Patient has prior history of Echocardiogram examinations, most                 recent 07/24/2017. CHF, Stroke, Arrythmias:Atrial Fibrillation;                 Risk Factors:Hypertension, Diabetes and Dyslipidemia. This                 admit: Sepsis due to undetermined organsim, Chronic diastolic                 (congestive) heart failure, Chronic anticoagulation.  Sonographer:    Celesta Gentile RCS Referring Phys: [id number] [facility] IMPRESSIONS  1. Left ventricular ejection fraction, by estimation, is 65 to 70%. The left ventricle has normal function. The left ventricle has no regional wall motion abnormalities. Left ventricular diastolic parameters are indeterminate.  2. Right ventricular systolic function is low normal. The right ventricular size is normal. There is normal pulmonary artery systolic pressure.  3. The mitral valve is abnormal. Mild mitral valve regurgitation.  4. The aortic valve is abnormal. Aortic valve regurgitation is not visualized. Mild to moderate aortic valve sclerosis/calcification is present, without any evidence of aortic stenosis.  5. The inferior vena cava is dilated in size with >50% respiratory variability, suggesting right atrial pressure of 8 mmHg.  FINDINGS  Left Ventricle: Left ventricular ejection fraction, by estimation, is 65 to 70%. The left ventricle has normal function. The left ventricle has no regional wall motion abnormalities. The left ventricular internal cavity size was normal in size. There is  no left ventricular hypertrophy. Left ventricular diastolic parameters are indeterminate. Right Ventricle: The right ventricular size is normal. Right vetricular wall thickness was not assessed. Right ventricular systolic function is low normal. There is normal pulmonary artery systolic pressure. The tricuspid regurgitant velocity is 2.59 m/s, and with an assumed right atrial pressure of 8 mmHg, the estimated right ventricular systolic pressure is 34.8 mmHg. Left Atrium: Left atrial size was normal in size. Right Atrium: Right atrial size was normal in size. Pericardium: Trivial pericardial effusion is present. Mitral Valve: The mitral valve is abnormal.  There is mild thickening of the mitral valve leaflet(s). Mild mitral annular calcification. Mild mitral valve regurgitation. Tricuspid Valve: The tricuspid valve is grossly normal. Tricuspid valve regurgitation is mild. Aortic Valve: The aortic valve is abnormal. Aortic valve regurgitation is not visualized. Mild to moderate aortic valve sclerosis/calcification is present, without any evidence of aortic stenosis. Pulmonic Valve: The pulmonic valve was grossly normal. Pulmonic valve regurgitation is not visualized. Aorta: The aortic root is normal in size and structure. Venous: The inferior vena cava is dilated in size with greater than 50% respiratory variability, suggesting right atrial pressure of 8 mmHg. IAS/Shunts: No atrial level shunt detected by color flow Doppler.  LEFT VENTRICLE PLAX 2D LVIDd:         4.20 cm LVIDs:         2.40 cm LV PW:         1.00 cm LV IVS:        1.00 cm LVOT diam:     1.60 cm LV SV:         45 LV SV Index:   22 LVOT Area:     2.01 cm  RIGHT VENTRICLE TAPSE (M-mode): 1.5 cm  LEFT ATRIUM             Index       RIGHT ATRIUM           Index LA diam:        4.50 cm 2.24 cm/m  RA Area:     25.30 cm LA Vol (A2C):   57.4 ml 28.52 ml/m RA Volume:   85.60 ml  42.53 ml/m LA Vol (A4C):   59.5 ml 29.56 ml/m LA Biplane Vol: 61.8 ml 30.71 ml/m  AORTIC VALVE LVOT Vmax:   115.00 cm/s LVOT Vmean:  77.300 cm/s LVOT VTI:    0.222 m  AORTA Ao Root diam: 2.90 cm MITRAL VALVE                TRICUSPID VALVE MV Area (PHT): 3.16 cm     TR Peak grad:   26.8 mmHg MV Decel Time: 240 msec     TR Vmax:        259.00 cm/s MV E velocity: 183.00 cm/s                             SHUNTS                             Systemic VTI:  0.22 m                             Systemic Diam: 1.60 cm Dorris Carnes MD Electronically signed by Dorris Carnes MD Signature Date/Time: 04/01/2020/5:04:43 PM    Final         Scheduled Meds: . atorvastatin  20 mg Oral Daily  . buPROPion  100 mg Oral BID  . cycloSPORINE  1 drop Both Eyes BID  . diltiazem  240 mg Oral Daily  . gabapentin  300 mg Oral TID  . hydrocortisone sod succinate (SOLU-CORTEF) inj  50 mg Intravenous Q12H  . insulin aspart  0-15 Units Subcutaneous TID WC  . insulin aspart  0-5 Units Subcutaneous QHS  . insulin glargine  20 Units Subcutaneous Daily  . mouth rinse  15 mL Mouth Rinse BID  . metoprolol tartrate  100 mg  Oral BID  . mometasone-formoterol  2 puff Inhalation BID  . pantoprazole  40 mg Oral Daily  . polyvinyl alcohol  2 drop Both Eyes q AM  . Rivaroxaban  20 mg Oral Q breakfast  . senna  1 tablet Oral Daily   Continuous Infusions: . sodium chloride Stopped (03/31/20 1148)  . cefTRIAXone (ROCEPHIN)  IV 2 g (04/02/20 1030)     LOS: 3 days    Time spent: 35 minutes    Edwin Dada, MD [facility] 04/02/2020, 12:00 PM     Please page though [place] or Epic secure chat:  For [employer], Adult nurse

## 2020-04-02 NOTE — Progress Notes (Addendum)
Progress Note  Patient Name: [name] Date of Encounter: 04/02/2020  Primary Cardiologist: Kirk Ruths, MD   Subjective   Sitting up in her chair at the bedside. Feels like her breathing is back to baseline. No chest pain or palpitations.   Inpatient Medications    Scheduled Meds: . atorvastatin  20 mg Oral Daily  . buPROPion  100 mg Oral BID  . cycloSPORINE  1 drop Both Eyes BID  . diltiazem  240 mg Oral Daily  . gabapentin  300 mg Oral TID  . hydrocortisone sod succinate (SOLU-CORTEF) inj  50 mg Intravenous Q12H  . insulin aspart  0-15 Units Subcutaneous TID WC  . insulin aspart  0-5 Units Subcutaneous QHS  . insulin glargine  20 Units Subcutaneous Daily  . mouth rinse  15 mL Mouth Rinse BID  . metoprolol tartrate  50 mg Oral TID  . mometasone-formoterol  2 puff Inhalation BID  . pantoprazole  40 mg Oral Daily  . polyvinyl alcohol  2 drop Both Eyes q AM  . Rivaroxaban  20 mg Oral Q breakfast  . senna  1 tablet Oral Daily   Continuous Infusions: . sodium chloride Stopped (03/31/20 1148)  . cefTRIAXone (ROCEPHIN)  IV Stopped (04/01/20 1045)   PRN Meds: acetaminophen **OR** acetaminophen, albuterol, antiseptic oral rinse, guaiFENesin-dextromethorphan, HYDROcodone-acetaminophen, LORazepam **OR** [DISCONTINUED] LORazepam **OR** LORazepam, ondansetron **OR** ondansetron (ZOFRAN) IV, polyvinyl alcohol   Vital Signs    Vitals:   04/02/20 0500 04/02/20 0600 04/02/20 0700 04/02/20 0745  BP: 127/82 (!) 136/111 120/64   Pulse: 84 68 87   Resp: (!) 24 (!) 32 (!) 24   Temp:    98.1 F (36.7 C)  TempSrc:    Oral  SpO2: 97% 93% 96%   Weight:      Height:        Intake/Output Summary (Last 24 hours) at 04/02/2020 0843 Last data filed at 04/02/2020 0400 Gross per 24 hour  Intake 331.69 ml  Output 1350 ml  Net -1018.31 ml    Last 3 Weights 03/31/2020 03/30/2020 08/24/2018  Weight (lbs) 197 lb 12 oz 179 lb 14.3 oz 180 lb  Weight (kg) 89.7 kg 81.6 kg 81.647 kg       Telemetry    Atrial fibrillation, HR in 80's to 90's.  - Personally Reviewed  ECG    No new tracings.   Physical Exam   General: Well developed elderly female appearing in no acute distress. Head: Normocephalic, atraumatic.  Neck: Supple without bruits, JVD not elevated. Lungs:  Resp regular and unlabored, mild rales bilaterally. Heart: Irregularly irregular, S1, S2, no S3, S4, or murmur; no rub. Abdomen: Soft, non-tender, non-distended with normoactive bowel sounds. No hepatomegaly. No rebound/guarding. No obvious abdominal masses. Extremities: Erythema along LLE but improved from prior examinations.  Distal pedal pulses are 2+ bilaterally. Neuro: Alert and oriented X 3. Moves all extremities spontaneously. Psych: Normal affect.  Labs    Chemistry Recent Labs  Lab 03/30/20 0035 03/30/20 0716 03/31/20 1014 04/01/20 0218 04/02/20 0514  NA 138   < > 137 139 142  K 3.8   < > 4.4 3.8 3.2*  CL 100   < > 103 105 106  CO2 23   < > 22 23 23   GLUCOSE 133*   < > 155* 202* 159*  BUN 23   < > 16 17 20   CREATININE 1.05*   < > 0.93 0.78 0.94  CALCIUM 9.5   < > 8.2* 8.6*  8.8*  PROT 7.5  --  6.0* 6.4*  --   ALBUMIN 3.6  --  2.7* 2.8*  --   AST 18  --  42* 26  --   ALT 12  --  9 15  --   ALKPHOS 90  --  69 77  --   BILITOT 0.8  --  1.6* 1.0  --   GFRNONAA 55*   < > >60 >60 >60  ANIONGAP 15   < > 12 11 13    < > = values in this interval not displayed.     Hematology Recent Labs  Lab 03/31/20 1014 04/01/20 0218 04/02/20 0514  WBC 14.8* 11.2* 8.3  RBC 4.29 4.15 3.71*  HGB 11.2* 10.9* 9.9*  HCT 38.5 36.6 33.2*  MCV 89.7 88.2 89.5  MCH 26.1 26.3 26.7  MCHC 29.1* 29.8* 29.8*  RDW 17.8* 17.5* 17.3*  PLT 205 258 277    Cardiac EnzymesNo results for input(s): TROPONINI in the last 168 hours. No results for input(s): TROPIPOC in the last 168 hours.   BNPNo results for input(s): BNP, PROBNP in the last 168 hours.   DDimer No results for input(s): DDIMER in the last  168 hours.   Radiology    04/04/20 Venous Img Lower Unilateral Left (DVT)  Result Date: 04/01/2020 CLINICAL DATA:  74 year old female with a history of erythema EXAM: LEFT LOWER EXTREMITY VENOUS DOPPLER ULTRASOUND TECHNIQUE: Gray-scale sonography with graded compression, as well as color Doppler and duplex ultrasound were performed to evaluate the lower extremity deep venous systems from the level of the common femoral vein and including the common femoral, femoral, profunda femoral, popliteal and calf veins including the posterior tibial, peroneal and gastrocnemius veins when visible. The superficial great saphenous vein was also interrogated. Spectral Doppler was utilized to evaluate flow at rest and with distal augmentation maneuvers in the common femoral, femoral and popliteal veins. COMPARISON:  None. FINDINGS: Contralateral Common Femoral Vein: Respiratory phasicity is normal and symmetric with the symptomatic side. No evidence of thrombus. Normal compressibility. Common Femoral Vein: No evidence of thrombus. Normal compressibility, respiratory phasicity and response to augmentation. Saphenofemoral Junction: No evidence of thrombus. Normal compressibility and flow on color Doppler imaging. Profunda Femoral Vein: No evidence of thrombus. Normal compressibility and flow on color Doppler imaging. Femoral Vein: No evidence of thrombus. Normal compressibility, respiratory phasicity and response to augmentation. Popliteal Vein: No evidence of thrombus. Normal compressibility, respiratory phasicity and response to augmentation. Calf Veins: No evidence of thrombus. Normal compressibility and flow on color Doppler imaging. Superficial Great Saphenous Vein: No evidence of thrombus. Normal compressibility and flow on color Doppler imaging. Other Findings:  None. IMPRESSION: Sonographic survey of the left lower extremity negative for DVT Electronically Signed   By: 61 D.O.   On: 04/01/2020 13:13   DG CHEST  PORT 1 VIEW  Result Date: 03/31/2020 CLINICAL DATA:  Lethargic unresponsive EXAM: PORTABLE CHEST 1 VIEW COMPARISON:  10/22/2019, 03/04/2018 FINDINGS: Enlarged cardiomediastinal silhouette. Increased airspace disease at the left lung base compared to prior. No pleural effusion. No pneumothorax. IMPRESSION: Increased airspace disease at the left lung base may reflect atelectasis or pneumonia. Stable cardiomegaly. Electronically Signed   By: 14/04/2017 M.D.   On: 03/31/2020 19:07    Cardiac Studies   Echocardiogram: 04/01/2020 IMPRESSIONS    1. Left ventricular ejection fraction, by estimation, is 65 to 70%. The  left ventricle has normal function. The left ventricle has no regional  wall motion abnormalities. Left  ventricular diastolic parameters are  indeterminate.  2. Right ventricular systolic function is low normal. The right  ventricular size is normal. There is normal pulmonary artery systolic  pressure.  3. The mitral valve is abnormal. Mild mitral valve regurgitation.  4. The aortic valve is abnormal. Aortic valve regurgitation is not  visualized. Mild to moderate aortic valve sclerosis/calcification is  present, without any evidence of aortic stenosis.  5. The inferior vena cava is dilated in size with >50% respiratory  variability, suggesting right atrial pressure of 8 mmHg.   Patient Profile     74 y.o. female w/PMH of permanent atrial fibrillation (on Xarelto), history of GIB (occuring in 2017 secondary to AVM's), chronic diastolic CHF,HTN, HLD, Stage 3 CKD and prior CVA who is currently admitted for sepsis in the setting of Group B Streptococcus bacteremia. Cardiology consulted due to atrial fibrillation with RVR.   Assessment & Plan    1. Atrial Fibrillation with RVR - She has known permanent atrial fibrillation but presented with sepsis and atrial fibrillation with RVR.  - She initially required IV Cardizem and this has now been weaned. Lopressor 50mg  TID and  Cardizem CD 240mg  daily have been resumed and rates have improved into the 80's and 90's. Continue current regimen.  - This patients CHA2DS2-VASc Score and unadjusted Ischemic Stroke Rate (% per year) is equal to 11.2 % stroke rate/year from a score of 7 (HTN, Vascular, Female, Age (2), CVA (2). She has been restarted on Xarelto 20mg  daily.   2. Chronic Diastolic CHF - She received 7.0+ L of IVF upon admission in the setting of hypotension and sepsis. Variable weights recorded as listed as 179 lbs on admission and at 197 lbs on 12/28 with no recorded weights since. Will ask for daily weights.   3. Sepsis in the setting of Group B Streptococcus - Was febrile to 104.3 on arrival and WBC elevated to 21.0. She has not been afebrile for 24 hours and WBC has improved to 8.3. - By review of the Hospitalist notes from 12/29, ID recommended a TEE given her bacteremia but repeat blood cultures show no growth thus far. TTE showed no evidence of vegetations. It is not the most sensitive test but pt has such abrupt presentation after tooth extraction and now abrupt improvement     Review with ID   Able to do TEE if they feel it is indicated but this would need t obe arranged for early next week  Can be as outpt 4. Goals of Care - She was initially made [place] but had significant improvement following administration of IV antibiotics. She is DNR but is now being treated for her acute illness as outlined above.    For questions or updates, please contact [place] Please consult [email or web address] for contact info under Cardiology/STEMI.   Signed, Erma Heritage , PA-C 8:43 AM 04/02/2020 Pager: [phone]  Pt seen and examined   I have amended note above by B Strader. Pt comfortable   Alert   In chair Lungs aer CTA Cardiac   Irreg rate and rhythm Ext show triv edema   L shin better  Afib:  Rates much improved now that she is not septic    She was on dilt and metoprolol as outpt   I  would advance metorpolol to 100 bid (she is on 50 tid; she was taking 100 bid at home)   Continue current dilt  Sepsis.   Marked improvement clinically   Also started  after recent tooth extraction   Echo without obvious vegetation but not the most sensitive test.  If ID recomm TEE would need to be schedule for new we.   If TEE recomm would need to be done as outpt Please call .    Dorris Carnes MD

## 2020-04-02 NOTE — Progress Notes (Signed)
Report called to RN on department 300. Pt to be transferred via wheelchair.

## 2020-04-03 ENCOUNTER — Inpatient Hospital Stay (HOSPITAL_COMMUNITY): Payer: Medicare Other

## 2020-04-03 DIAGNOSIS — A419 Sepsis, unspecified organism: Secondary | ICD-10-CM | POA: Diagnosis not present

## 2020-04-03 LAB — BASIC METABOLIC PANEL
Anion gap: 11 (ref 5–15)
BUN: 17 mg/dL (ref 8–23)
CO2: 25 mmol/L (ref 22–32)
Calcium: 8.9 mg/dL (ref 8.9–10.3)
Chloride: 105 mmol/L (ref 98–111)
Creatinine, Ser: 0.73 mg/dL (ref 0.44–1.00)
GFR, Estimated: >60 mL/min (ref >60)
Glucose, Bld: 178 mg/dL — ABNORMAL HIGH (ref 70–99)
Potassium: 3.1 mmol/L — ABNORMAL LOW (ref 3.5–5.1)
Sodium: 141 mmol/L (ref 135–145)

## 2020-04-03 LAB — CBC
HCT: 32.7 % — ABNORMAL LOW (ref 36.0–46.0)
Hemoglobin: 9.6 g/dL — ABNORMAL LOW (ref 12.0–15.0)
MCH: 25.6 pg — ABNORMAL LOW (ref 26.0–34.0)
MCHC: 29.4 g/dL — ABNORMAL LOW (ref 30.0–36.0)
MCV: 87.2 fL (ref 80.0–100.0)
Platelets: 280 10*3/uL (ref 150–400)
RBC: 3.75 MIL/uL — ABNORMAL LOW (ref 3.87–5.11)
RDW: 17.2 % — ABNORMAL HIGH (ref 11.5–15.5)
WBC: 7.9 10*3/uL (ref 4.0–10.5)
nRBC: 0 % (ref 0.0–0.2)

## 2020-04-03 LAB — GLUCOSE, CAPILLARY
Glucose-Capillary: 191 mg/dL — ABNORMAL HIGH (ref 70–99)
Glucose-Capillary: 248 mg/dL — ABNORMAL HIGH (ref 70–99)

## 2020-04-03 MED ORDER — SODIUM CHLORIDE 0.9% FLUSH: 10.0000 mL | INTRAVENOUS | Status: DC | PRN

## 2020-04-03 MED ORDER — SODIUM CHLORIDE 0.9% FLUSH: 10.0000 mL | Freq: Two times a day (BID) | INTRAVENOUS | Status: DC

## 2020-04-03 MED ORDER — HYDROCODONE-ACETAMINOPHEN 5-325 MG PO TABS: 1.0000 | ORAL_TABLET | Freq: Four times a day (QID) | ORAL | 0 refills | Status: DC | PRN

## 2020-04-03 MED ORDER — CHLORHEXIDINE GLUCONATE CLOTH 2 % EX PADS: 6.0000 | MEDICATED_PAD | Freq: Every day | CUTANEOUS | Status: DC

## 2020-04-03 MED ORDER — OXYCODONE HCL 5 MG PO TABS
7.5000 mg | ORAL_TABLET | Freq: Four times a day (QID) | ORAL | 0 refills | Status: DC | PRN
Start: 2020-04-03 — End: 2021-05-25

## 2020-04-03 MED ORDER — CEFTRIAXONE IV (FOR PTA / DISCHARGE USE ONLY): 2.0000 g | INTRAVENOUS | 0 refills | Status: AC

## 2020-04-03 NOTE — Progress Notes (Signed)
PICC line tip terminates in SVC. [place], RN notified.

## 2020-04-03 NOTE — NC FL2 (Signed)
[place] MEDICAID FL2 LEVEL OF CARE SCREENING TOOL     IDENTIFICATION  Patient Name: [name] Birthdate: May 14, 1945 Sex: female Admission Date (Current Location): 03/30/2020  [place] and [place] Number:  [place] [facility] [id number] [place] and Address:  [facility] [address] [place]      Provider Number: [id number]  Attending Physician Name and Address:  Edwin Dada, *  Relative Name and Phone Number:  [name] (son) Ph: [phone]    Current Level of Care: Hospital Recommended Level of Care: [place] (Patient is LTC resident of [facility]) Prior Approval Number:    Date Approved/Denied:   PASRR Number:    Discharge Plan: SNF    Current Diagnoses: Patient Active Problem List   Diagnosis Date Noted  . Sepsis due to undetermined organism ([place]) 03/30/2020  . Hypomagnesemia 03/30/2020  . Hypophosphatemia 03/30/2020  . Hyperglycemia   . Sepsis ([place]) 10/04/2017  . HCAP (healthcare-associated pneumonia) 10/04/2017  . Acute lower UTI 10/04/2017  . Hallucinations 07/25/2017  . Abnormal LFTs 07/24/2017  . Acute on chronic diastolic CHF (congestive heart failure) ([place])   . Elevated LFTs   . AKI (acute kidney injury) ([place])   . Elevated troponin   . Stroke, acute, embolic ([place]) 29/93/7169  . Mitral regurgitation 06/17/2017  . Acute CVA (cerebrovascular accident) ([place])   . Thyroid nodule 06/15/2017  . Atrial fibrillation with RVR ([place]) 06/14/2017  . CVA (cerebral vascular accident) ([place]) 06/14/2017  . Type II diabetes mellitus ([place])   . Peripheral vascular disease ([place])   . Hyperlipidemia   . Hiatal hernia   . Edema   . Chronic diastolic (congestive) heart failure ([place])   . Carpal tunnel syndrome   . Atrial fibrillation ([place])   . Anemia   . Actinic keratosis   . Intractable nausea and vomiting 05/31/2016  . Nausea & vomiting 05/30/2016  . Abnormal chest x-ray 05/30/2016  . Diabetes mellitus with  complication ([place])   . Influenza A 05/21/2016  . Chronic atrial fibrillation ([place]) 05/20/2016  . Acute CHF (congestive heart failure) ([place]) 05/20/2016  . History of GI bleed 03/29/2016  . Chronic anticoagulation 03/29/2016  . Symptomatic anemia 03/19/2016  . Spinal stenosis of lumbar region 02/10/2016  . Varicose veins of left leg with edema 09/15/2015  . Preop cardiovascular exam 08/25/2015  . Iron deficiency anemia due to chronic blood loss 07/17/2015  . Anemia of chronic renal failure, stage 3 (moderate) ([place]) 07/17/2015  . Anticoagulant causing adverse effect in therapeutic use 07/17/2015  . Symptomatic bradycardia 02/21/2015  . Bradycardia   . Acute deep vein thrombosis (DVT) of left lower extremity ([place]) 05/06/2014  . Abdominal wall mass of right lower quadrant 07/08/2013  . OA (osteoarthritis) of hip 04/03/2013  . Osteoarthritis of hip 01/18/2013  . Night sweats 09/06/2012  . Chest pain   . Anxiety and depression   . Gastroesophageal reflux disease   . Hepatitis   . Jaundice   . Hypertension 07/01/2010  . Hypercholesterolemia 07/01/2010  . DM2 (diabetes mellitus, type 2) ([place]) 07/01/2010  . Rheumatoid arthritis ([place]) 07/01/2010  . Sinus tachycardia 07/01/2010  . Chest pain, atypical 07/01/2010    Orientation RESPIRATION BLADDER Height & Weight     Self,Time  O2 (2L/min) Incontinent Weight: 97.2 kg Height:  _0  (170.2 cm)  BEHAVIORAL SYMPTOMS/MOOD NEUROLOGICAL BOWEL NUTRITION STATUS      Incontinent Diet (Soft diet)  AMBULATORY STATUS COMMUNICATION OF NEEDS Skin   Limited Assist Verbally Other (  Comment) (Cracking: left foot and toes)                       Personal Care Assistance Level of Assistance  Bathing,Feeding,Dressing Bathing Assistance: Limited assistance Feeding assistance: Independent Dressing Assistance: Limited assistance     Functional Limitations Info  Sight,Hearing,Speech Sight Info: Adequate Hearing Info: Adequate Speech Info:  Adequate    SPECIAL CARE FACTORS FREQUENCY                       Contractures Contractures Info: Not present    Additional Factors Info  Code Status,Allergies,Psychotropic Code Status Info: DNR Allergies Info: Promethazine Psychotropic Info: Wellbutrin (bupropion); Neurontin (gabapentin)         Current Medications (04/03/2020):  This is the current hospital active medication list Current Facility-Administered Medications  Medication Dose Route Frequency Provider Last Rate Last Admin  . 0.9 %  sodium chloride infusion   Intravenous Continuous Quinn Axe A, NP   Stopped at 03/31/20 1148  . acetaminophen (TYLENOL) tablet 650 mg  650 mg Oral Q6H PRN Dove, Tasha A, NP   650 mg at 04/01/20 2209   Or  . acetaminophen (TYLENOL) suppository 650 mg  650 mg Rectal Q6H PRN Dove, Tasha A, NP   650 mg at 03/31/20 1507  . albuterol (VENTOLIN HFA) 108 (90 Base) MCG/ACT inhaler 2 puff  2 puff Inhalation Q4H PRN Barb Merino, MD      . antiseptic oral rinse (BIOTENE) solution 15 mL  15 mL Topical PRN Dove, Tasha A, NP      . atorvastatin (LIPITOR) tablet 20 mg  20 mg Oral Daily Kathie Dike, MD   20 mg at 04/03/20 0945  . buPROPion (WELLBUTRIN SR) 12 hr tablet 100 mg  100 mg Oral BID Barb Merino, MD   100 mg at 04/03/20 0946  . cefTRIAXone (ROCEPHIN) 2 g in sodium chloride 0.9 % 100 mL IVPB  2 g Intravenous Q24H Kathie Dike, MD 200 mL/hr at 04/03/20 1008 2 g at 04/03/20 1008  . chlorhexidine (PERIDEX) 0.12 % solution 15 mL  15 mL Mouth/Throat BID Edwin Dada, MD   15 mL at 04/03/20 0949  . cycloSPORINE (RESTASIS) 0.05 % ophthalmic emulsion 1 drop  1 drop Both Eyes BID Barb Merino, MD   1 drop at 04/03/20 0950  . diltiazem (CARDIZEM CD) 24 hr capsule 240 mg  240 mg Oral Daily Barb Merino, MD   240 mg at 04/03/20 0945  . gabapentin (NEURONTIN) capsule 300 mg  300 mg Oral TID Barb Merino, MD   300 mg at 04/03/20 0945  . guaiFENesin-dextromethorphan (ROBITUSSIN  DM) 100-10 MG/5ML syrup 5 mL  5 mL Oral Q4H PRN Barb Merino, MD      . HYDROcodone-acetaminophen (NORCO/VICODIN) 5-325 MG per tablet 1 tablet  1 tablet Oral Q6H PRN Barb Merino, MD   1 tablet at 04/02/20 2232  . hydrocortisone sodium succinate (SOLU-CORTEF) 100 MG injection 50 mg  50 mg Intravenous Q12H Barb Merino, MD   50 mg at 04/03/20 0324  . insulin aspart (novoLOG) injection 0-15 Units  0-15 Units Subcutaneous TID WC Kathie Dike, MD   5 Units at 04/03/20 1255  . insulin aspart (novoLOG) injection 0-5 Units  0-5 Units Subcutaneous QHS Kathie Dike, MD   2 Units at 04/01/20 2235  . insulin glargine (LANTUS) injection 20 Units  20 Units Subcutaneous Daily Edwin Dada, MD   20 Units at 04/02/20 1022  .  LORazepam (ATIVAN) tablet 1 mg  1 mg Oral Q4H PRN Quinn Axe A, NP       Or  . LORazepam (ATIVAN) injection 1 mg  1 mg Intravenous Q4H PRN Dove, Tasha A, NP   1 mg at 03/31/20 0035  . metoprolol tartrate (LOPRESSOR) tablet 100 mg  100 mg Oral BID Fay Records, MD   100 mg at 04/03/20 0946  . mometasone-formoterol (DULERA) 200-5 MCG/ACT inhaler 2 puff  2 puff Inhalation BID Barb Merino, MD   2 puff at 04/03/20 4765  . ondansetron (ZOFRAN-ODT) disintegrating tablet 4 mg  4 mg Oral Q6H PRN Dove, Tasha A, NP       Or  . ondansetron (ZOFRAN) injection 4 mg  4 mg Intravenous Q6H PRN Dove, Tasha A, NP      . pantoprazole (PROTONIX) EC tablet 40 mg  40 mg Oral Daily Barb Merino, MD   40 mg at 04/03/20 0945  . polyvinyl alcohol (LIQUIFILM TEARS) 1.4 % ophthalmic solution 1 drop  1 drop Both Eyes QID PRN Dove, Tasha A, NP      . polyvinyl alcohol (LIQUIFILM TEARS) 1.4 % ophthalmic solution 2 drop  2 drop Both Eyes q AM Barb Merino, MD   2 drop at 04/03/20 0559  . rivaroxaban (XARELTO) tablet 20 mg  20 mg Oral Q breakfast Barb Merino, MD   20 mg at 04/03/20 0947  . senna (SENOKOT) tablet 8.6 mg  1 tablet Oral Daily Barb Merino, MD   8.6 mg at 04/03/20 0946      Discharge Medications: Medication List        STOP taking these medications       Abatacept 125 MG/ML Soaj Commonly known as: [place]cia ClickJect   leflunomide 10 MG tablet Commonly known as: ARAVA   OXYCODONE HCL PO Replaced by: oxyCODONE 5 MG immediate release tablet             TAKE these medications       acetaminophen 650 MG CR tablet Commonly known as: TYLENOL Take 1,300 mg by mouth in the morning and at bedtime.   albuterol 108 (90 Base) MCG/ACT inhaler Commonly known as: VENTOLIN HFA Inhale 2 puffs into the lungs every 4 (four) hours as needed for wheezing or shortness of breath.   Artificial Tears 1 % ophthalmic solution Generic drug: carboxymethylcellulose Place 2 drops into both eyes in the morning.   atorvastatin 20 MG tablet Commonly known as: LIPITOR Take 20 mg by mouth daily.   azelastine 0.05 % ophthalmic solution Commonly known as: OPTIVAR Place 1 drop into both eyes 2 (two) times daily.   buPROPion 150 MG 12 hr tablet Commonly known as: WELLBUTRIN SR Take 100 mg by mouth 2 (two) times daily.   cefTRIAXone  IVPB Commonly known as: ROCEPHIN Inject 2 g into the vein daily for 10 doses. Indication: UTI First Dose: yes Last Day of Therapy: 04/13/2020 Labs - Once weekly:  CBC/D and BMP, Labs - Every other week:  ESR and CRP Method of administration: IV Push Method of administration may be changed at the discretion of home infusion pharmacist based upon assessment of the patient and/or caregiver's ability to self-administer the medication ordered.   cycloSPORINE 0.05 % ophthalmic emulsion Commonly known as: RESTASIS Place 1 drop into both eyes 2 (two) times daily.   diltiazem 240 MG 24 hr capsule Commonly known as: CARDIZEM CD Take 240 mg by mouth daily.   feeding supplement (PRO-STAT 64) Liqd Take 30  mLs by mouth in the morning.   fluticasone-salmeterol 115-21 MCG/ACT inhaler Commonly known as: ADVAIR HFA Inhale 2  puffs into the lungs 2 times daily at 12 noon and 4 pm.   furosemide 40 MG tablet Commonly known as: LASIX Take 40 mg by mouth daily.   gabapentin 100 MG capsule Commonly known as: NEURONTIN Take 3 capsules (300 mg total) by mouth 3 (three) times daily.   HYDROcodone-acetaminophen 5-325 MG tablet Commonly known as: NORCO/VICODIN Take 1 tablet by mouth every 6 (six) hours as needed. What changed: reasons to take this   insulin glargine 100 UNIT/ML injection Commonly known as: LANTUS Inject 30 Units into the skin in the morning. Hold if BS is less than 150   insulin lispro 100 UNIT/ML injection Commonly known as: HUMALOG Inject 6 Units into the skin in the morning and at bedtime. Hold is BS <100   lidocaine 5 % Commonly known as: LIDODERM Place 1 patch onto the skin daily. Remove & Discard patch within 12 hours or as directed by MD   metFORMIN 500 MG 24 hr tablet Commonly known as: GLUCOPHAGE-XR Take 1,000 mg by mouth 2 (two) times daily.   metoprolol tartrate 100 MG tablet Commonly known as: LOPRESSOR Take 1 tablet (100 mg total) by mouth 3 (three) times daily. What changed: when to take this   nitroGLYCERIN 0.4 MG SL tablet Commonly known as: NITROSTAT Place 1 tablet (0.4 mg total) under the tongue every 5 (five) minutes as needed for chest pain (x 3 doses).   OcuSoft Lid Scrub Original Pads Apply 1 application topically in the morning and at bedtime. Apply to both eyelids for eye problem   omeprazole 20 MG capsule Commonly known as: PRILOSEC Take 1 capsule by mouth daily.   oxyCODONE 5 MG immediate release tablet Commonly known as: Oxy IR/ROXICODONE Take 1.5 tablets (7.5 mg total) by mouth every 6 (six) hours as needed (Pain). Replaces: OXYCODONE HCL PO   predniSONE 5 MG tablet Commonly known as: DELTASONE Take 5 mg by mouth daily.   PreserVision AREDS 2 Caps Take 1 capsule by mouth daily.   CERTAGEN PO Take 1 tablet by mouth in the morning.  For wound healing   Rejuvacare Plus Crea Apply 1 application topically in the morning and at bedtime. Apply to face for dry skin patches   Rivaroxaban 15 MG Tabs tablet Commonly known as: XARELTO Take 1 tablet (15 mg total) by mouth daily. What changed: how much to take   senna 8.6 MG Tabs tablet Commonly known as: SENOKOT Take 1 tablet by mouth daily.   spironolactone 25 MG tablet Commonly known as: ALDACTONE Take 12.5 mg by mouth in the morning.   Vascepa 1 g capsule Generic drug: icosapent Ethyl Take 1 capsule by mouth every morning.   VITAMIN C PO Take 500 mg by mouth in the morning and at bedtime.   Vitamin D3 25 MCG (1000 UT) Caps Take 2,000 Units by mouth daily.   Voltaren 1 % Gel Generic drug: diclofenac Sodium Apply 2 g topically at bedtime as needed for pain. Knees, calf   diclofenac Sodium 1 % Gel Commonly known as: VOLTAREN Apply 2 g topically at bedtime as needed for pain.   zinc sulfate 220 (50 Zn) MG capsule Take 220 mg by mouth daily.                    Relevant Imaging Results:  Relevant Lab Results:   Additional Information SSN: [id number]  Boneta Lucks, RN

## 2020-04-03 NOTE — Discharge Summary (Signed)
Physician Discharge Summary  [name] HRC:[id number] DOB: Jul 20, 1945 DOA: 03/30/2020  PCP: Aletha Halim., PA-C  Admit date: 03/30/2020 Discharge date: 04/03/2020  Admitted From: [facility] Disposition:  [place]   Recommendations for Outpatient Follow-up:  1. Follow up with PCP Bing Matter in 2 weeks 2. Ms [name]: Please check BMP/CBC in 2 weeks after Ceftriaxone finished 3. Follow up with Rheumatology in 2 weeks to determine when to restart leflunomide and [place]: RN/PT due to ongoing balance issues  Equipment/Devices: IV pump for IV ceftriaxone  Discharge Condition: Good  CODE STATUS: DNR Diet recommendation: Cardiac, diabetic  Brief/Interim Summary: [name] is a 74 y.o. SNF-dwelling F with RA on prednisone, leflunamide and abatacept, Afib on Xarelto, IDDM, chronic anemia, dCHF, peripheral vascular disease and hx stroke who presented with AMS, sepsis.  In the ER, thought to have clinical evidence of cellulitis or pneumonia and was treated with broad spectrum antibiotics.  Paitent initially with poor response to therapy, made comfort measures, but then improved rapidly before antibiotics could be stopped.  Blood culture grew GBS, and patient relayed that her symptoms started shortly after a dental procedure.     PRINCIPAL HOSPITAL DIAGNOSIS: Severe sepsis due to group B strep bacteremia    Discharge Diagnoses:   Severe sepsis due to group B strep bacteremia Presented with encephalopathy, leukocytosis and tachypnea.  Found to have GBS bacteremia.  No embolic phenomena or clinical concern for endocarditis.  TTE negative for vegetations.  Given rapid onset after dental procedure, and repeat cultures after admission negative for 72 hours, endocarditis seems very unlikely.  -Continue ceftriaxone 2g IV to complete 12 days for uncomplicated GBS bacteremia     RA Hold Leflunomide and Orencia while on treatment for  bacteremia, resume at discretion of Rheumatology after completion of ceftriaxone  Permanent atrial fibrillation Continue Xarelto, metoprolol, diltiazem  Diabetes with polyneuropathy  Anemia Stable relative to baseline  Acute on chronic diastolic CHF Peripheral vascular disease Cerebrovascular disease Appeared euvolemic.  COPD No acute disease.  Continue ICS/LABA, PPI.  Left leg redness DVT study negative for DVT.            Discharge Instructions  Discharge Instructions    [facility] pharmacist to adjust dose for Vancomycin, Aminoglycosides and other anti-infective therapies as requested by physician.   Complete by: As directed    [facility] to provide Cath Flo 435m   Complete by: As directed    Administer for PICC line occlusion and as ordered by physician for other access device issues.   Amb referral to [facility]   Complete by: As directed    Anaphylaxis Kit: Provided to treat any anaphylactic reaction to the medication being provided to the patient if First Dose or when requested by physician   Complete by: As directed    Epinephrine 138mml vial / amp: Administer 0.35m69m0.35ml29mubcutaneously once for moderate to severe anaphylaxis, nurse to call physician and pharmacy when reaction occurs and call 911 if needed for immediate care   Diphenhydramine 50mg1035mIV vial: Administer 25-50mg 61mM PRN for first dose reaction, rash, itching, mild reaction, nurse to call physician and pharmacy when reaction occurs   Sodium Chloride 0.9% NS 500ml I55mdminister if needed for hypovolemic blood pressure drop or as ordered by physician after call to physician with anaphylactic reaction   Change dressing on IV access line weekly and PRN   Complete by: As directed  Discharge instructions   Complete by: As directed    You were admitted due to "sepsis" from "Group B strep" bacteremia You were treated with antibiotics and got better  You should take  IV antibiotics for 9 more days to complete a 12 day course You should take ceftriaxone/Rocephin 2 g once daily for the next 9 days  Have your doctor check your lab work at the end of therapy  Resume all your normal home medicines BUT STOP leflunamide and Orencia/abatacept for now  You may resume these when you are done with antibiotics for bacteremia/sepsis, if cleared by your Rheumatologist.   Flush IV access with Sodium Chloride 0.9% and Heparin 10 units/ml or 100 units/ml   Complete by: As directed    Home infusion instructions - [facility]   Complete by: As directed    Instructions: Flush IV access with Sodium Chloride 0.9% and Heparin 10units/ml or 100units/ml   Change dressing on IV access line: Weekly and PRN   Instructions Cath Flo 56m: Administer for PICC Line occlusion and as ordered by physician for other access device   [facility] pharmacist to adjust dose for: Vancomycin, Aminoglycosides and other anti-infective therapies as requested by physician   Increase activity slowly   Complete by: As directed    Method of administration may be changed at the discretion of home infusion pharmacist based upon assessment of the patient and/or caregiver's ability to self-administer the medication ordered   Complete by: As directed      Allergies as of 04/03/2020      Reactions   Promethazine       Medication List    STOP taking these medications   Abatacept 125 MG/ML Soaj Commonly known as: Orencia ClickJect   leflunomide 10 MG tablet Commonly known as: ARAVA   OXYCODONE HCL PO Replaced by: oxyCODONE 5 MG immediate release tablet     TAKE these medications   acetaminophen 650 MG CR tablet Commonly known as: TYLENOL Take 1,300 mg by mouth in the morning and at bedtime.   albuterol 108 (90 Base) MCG/ACT inhaler Commonly known as: VENTOLIN HFA Inhale 2 puffs into the lungs every 4 (four) hours as needed for wheezing or shortness of breath.    Artificial Tears 1 % ophthalmic solution Generic drug: carboxymethylcellulose Place 2 drops into both eyes in the morning.   atorvastatin 20 MG tablet Commonly known as: LIPITOR Take 20 mg by mouth daily.   azelastine 0.05 % ophthalmic solution Commonly known as: OPTIVAR Place 1 drop into both eyes 2 (two) times daily.   buPROPion 150 MG 12 hr tablet Commonly known as: WELLBUTRIN SR Take 100 mg by mouth 2 (two) times daily.   cefTRIAXone  IVPB Commonly known as: ROCEPHIN Inject 2 g into the vein daily for 10 doses. Indication: UTI First Dose: yes Last Day of Therapy: 04/13/2020 Labs - Once weekly:  CBC/D and BMP, Labs - Every other week:  ESR and CRP Method of administration: IV Push Method of administration may be changed at the discretion of home infusion pharmacist based upon assessment of the patient and/or caregiver's ability to self-administer the medication ordered.   cycloSPORINE 0.05 % ophthalmic emulsion Commonly known as: RESTASIS Place 1 drop into both eyes 2 (two) times daily.   diltiazem 240 MG 24 hr capsule Commonly known as: CARDIZEM CD Take 240 mg by mouth daily.   feeding supplement (PRO-STAT 64) Liqd Take 30 mLs by mouth in the morning.   fluticasone-salmeterol  115-21 MCG/ACT inhaler Commonly known as: ADVAIR HFA Inhale 2 puffs into the lungs 2 times daily at 12 noon and 4 pm.   furosemide 40 MG tablet Commonly known as: LASIX Take 40 mg by mouth daily.   gabapentin 100 MG capsule Commonly known as: NEURONTIN Take 3 capsules (300 mg total) by mouth 3 (three) times daily.   HYDROcodone-acetaminophen 5-325 MG tablet Commonly known as: NORCO/VICODIN Take 1 tablet by mouth every 6 (six) hours as needed. What changed: reasons to take this   insulin glargine 100 UNIT/ML injection Commonly known as: LANTUS Inject 30 Units into the skin in the morning. Hold if BS is less than 150   insulin lispro 100 UNIT/ML injection Commonly known as:  HUMALOG Inject 6 Units into the skin in the morning and at bedtime. Hold is BS <100   lidocaine 5 % Commonly known as: LIDODERM Place 1 patch onto the skin daily. Remove & Discard patch within 12 hours or as directed by MD   metFORMIN 500 MG 24 hr tablet Commonly known as: GLUCOPHAGE-XR Take 1,000 mg by mouth 2 (two) times daily.   metoprolol tartrate 100 MG tablet Commonly known as: LOPRESSOR Take 1 tablet (100 mg total) by mouth 3 (three) times daily. What changed: when to take this   nitroGLYCERIN 0.4 MG SL tablet Commonly known as: NITROSTAT Place 1 tablet (0.4 mg total) under the tongue every 5 (five) minutes as needed for chest pain (x 3 doses).   OcuSoft Lid Scrub Original Pads Apply 1 application topically in the morning and at bedtime. Apply to both eyelids for eye problem   omeprazole 20 MG capsule Commonly known as: PRILOSEC Take 1 capsule by mouth daily.   oxyCODONE 5 MG immediate release tablet Commonly known as: Oxy IR/ROXICODONE Take 1.5 tablets (7.5 mg total) by mouth every 6 (six) hours as needed (Pain). Replaces: OXYCODONE HCL PO   predniSONE 5 MG tablet Commonly known as: DELTASONE Take 5 mg by mouth daily.   PreserVision AREDS 2 Caps Take 1 capsule by mouth daily.   CERTAGEN PO Take 1 tablet by mouth in the morning. For wound healing   Rejuvacare Plus Crea Apply 1 application topically in the morning and at bedtime. Apply to face for dry skin patches   Rivaroxaban 15 MG Tabs tablet Commonly known as: XARELTO Take 1 tablet (15 mg total) by mouth daily. What changed: how much to take   senna 8.6 MG Tabs tablet Commonly known as: SENOKOT Take 1 tablet by mouth daily.   spironolactone 25 MG tablet Commonly known as: ALDACTONE Take 12.5 mg by mouth in the morning.   Vascepa 1 g capsule Generic drug: icosapent Ethyl Take 1 capsule by mouth every morning.   VITAMIN C PO Take 500 mg by mouth in the morning and at bedtime.   Vitamin D3 25  MCG (1000 UT) Caps Take 2,000 Units by mouth daily.   Voltaren 1 % Gel Generic drug: diclofenac Sodium Apply 2 g topically at bedtime as needed for pain. Knees, calf   diclofenac Sodium 1 % Gel Commonly known as: VOLTAREN Apply 2 g topically at bedtime as needed for pain.   zinc sulfate 220 (50 Zn) MG capsule Take 220 mg by mouth daily.            Discharge Care Instructions  (From admission, onward)         Start     Ordered   04/03/20 0000  Change dressing on IV access  line weekly and PRN  (Home infusion instructions - [facility] )        04/03/20 1154          Contact information for follow-up providers    Aletha Halim., PA-C. Schedule an appointment as soon as possible for a visit in 1 week(s).   Specialty: Family Medicine Contact information: [name] [place] [address] [phone]            Contact information for after-discharge care    Destination    [facility] .   Service: Skilled Nursing Contact information: [place] [phone]                 Allergies  Allergen Reactions  . Promethazine     Consultations:  Cardiology   Procedures/Studies: US Venous Img Lower Unilateral Left (DVT)  Result Date: 04/01/2020 CLINICAL DATA:  74 year old female with a history of erythema EXAM: LEFT LOWER EXTREMITY VENOUS DOPPLER ULTRASOUND TECHNIQUE: Gray-scale sonography with graded compression, as well as color Doppler and duplex ultrasound were performed to evaluate the lower extremity deep venous systems from the level of the common femoral vein and including the common femoral, femoral, profunda femoral, popliteal and calf veins including the posterior tibial, peroneal and gastrocnemius veins when visible. The superficial great saphenous vein was also interrogated. Spectral Doppler was utilized to evaluate flow at rest and with distal augmentation maneuvers in the  common femoral, femoral and popliteal veins. COMPARISON:  None. FINDINGS: Contralateral Common Femoral Vein: Respiratory phasicity is normal and symmetric with the symptomatic side. No evidence of thrombus. Normal compressibility. Common Femoral Vein: No evidence of thrombus. Normal compressibility, respiratory phasicity and response to augmentation. Saphenofemoral Junction: No evidence of thrombus. Normal compressibility and flow on color Doppler imaging. Profunda Femoral Vein: No evidence of thrombus. Normal compressibility and flow on color Doppler imaging. Femoral Vein: No evidence of thrombus. Normal compressibility, respiratory phasicity and response to augmentation. Popliteal Vein: No evidence of thrombus. Normal compressibility, respiratory phasicity and response to augmentation. Calf Veins: No evidence of thrombus. Normal compressibility and flow on color Doppler imaging. Superficial Great Saphenous Vein: No evidence of thrombus. Normal compressibility and flow on color Doppler imaging. Other Findings:  None. IMPRESSION: Sonographic survey of the left lower extremity negative for DVT Electronically Signed   By: Corrie Mckusick D.O.   On: 04/01/2020 13:13   DG CHEST PORT 1 VIEW  Result Date: 03/31/2020 CLINICAL DATA:  Lethargic unresponsive EXAM: PORTABLE CHEST 1 VIEW COMPARISON:  10/22/2019, 03/04/2018 FINDINGS: Enlarged cardiomediastinal silhouette. Increased airspace disease at the left lung base compared to prior. No pleural effusion. No pneumothorax. IMPRESSION: Increased airspace disease at the left lung base may reflect atelectasis or pneumonia. Stable cardiomegaly. Electronically Signed   By: Donavan Foil M.D.   On: 03/31/2020 19:07   DG Chest Port 1 View  Result Date: 03/30/2020 CLINICAL DATA:  Cough, fever, possible sepsis EXAM: PORTABLE CHEST 1 VIEW COMPARISON:  Radiograph 03/04/2018 FINDINGS: There are diffuse reticulonodular opacities throughout the lungs on a background of more chronic  interstitial and bronchitic change. No consolidative opacity. No pneumothorax. No visible effusions. Cardiomegaly is similar to priors with a calcified aorta. No acute osseous or soft tissue abnormality. Degenerative changes are present in the imaged spine and shoulders. Telemetry leads overlie the chest. IMPRESSION: Diffuse reticulonodular opacities throughout the lungs on a background of more chronic interstitial and bronchitic change. While this may be partially atelectatic, could  reflect atypical infection or edema in the appropriate clinical setting. Electronically Signed   By: Lovena Le M.D.   On: 03/30/2020 01:11   ECHOCARDIOGRAM COMPLETE  Result Date: 04/01/2020    ECHOCARDIOGRAM REPORT   Patient Name:   [name] Date of Exam: 04/01/2020 Medical Rec #:  [id number]      Height:       67.0 in Accession #:    [id number]     Weight:       197.8 lb Date of Birth:  December 07, 1945      BSA:          2.013 m Patient Age:    34 years       BP:           80/58 mmHg Patient Gender: F              HR:           75 bpm. Exam Location:  [facility] Procedure: 2D Echo, Cardiac Doppler and Color Doppler Indications:    Bacteremia R78.81  History:        Patient has prior history of Echocardiogram examinations, most                 recent 07/24/2017. CHF, Stroke, Arrythmias:Atrial Fibrillation;                 Risk Factors:Hypertension, Diabetes and Dyslipidemia. This                 admit: Sepsis due to undetermined organsim, Chronic diastolic                 (congestive) heart failure, Chronic anticoagulation.  Sonographer:    Alvino Chapel RCS Referring Phys: [id number] [place]  1. Left ventricular ejection fraction, by estimation, is 65 to 70%. The left ventricle has normal function. The left ventricle has no regional wall motion abnormalities. Left ventricular diastolic parameters are indeterminate.  2. Right ventricular systolic function is low normal. The right ventricular size is normal. There  is normal pulmonary artery systolic pressure.  3. The mitral valve is abnormal. Mild mitral valve regurgitation.  4. The aortic valve is abnormal. Aortic valve regurgitation is not visualized. Mild to moderate aortic valve sclerosis/calcification is present, without any evidence of aortic stenosis.  5. The inferior vena cava is dilated in size with >50% respiratory variability, suggesting right atrial pressure of 8 mmHg. FINDINGS  Left Ventricle: Left ventricular ejection fraction, by estimation, is 65 to 70%. The left ventricle has normal function. The left ventricle has no regional wall motion abnormalities. The left ventricular internal cavity size was normal in size. There is  no left ventricular hypertrophy. Left ventricular diastolic parameters are indeterminate. Right Ventricle: The right ventricular size is normal. Right vetricular wall thickness was not assessed. Right ventricular systolic function is low normal. There is normal pulmonary artery systolic pressure. The tricuspid regurgitant velocity is 2.59 m/s, and with an assumed right atrial pressure of 8 mmHg, the estimated right ventricular systolic pressure is 30.8 mmHg. Left Atrium: Left atrial size was normal in size. Right Atrium: Right atrial size was normal in size. Pericardium: Trivial pericardial effusion is present. Mitral Valve: The mitral valve is abnormal. There is mild thickening of the mitral valve leaflet(s). Mild mitral annular calcification. Mild mitral valve regurgitation. Tricuspid Valve: The tricuspid valve is grossly normal. Tricuspid valve regurgitation is mild. Aortic Valve: The aortic valve is abnormal. Aortic valve regurgitation is not visualized.  Mild to moderate aortic valve sclerosis/calcification is present, without any evidence of aortic stenosis. Pulmonic Valve: The pulmonic valve was grossly normal. Pulmonic valve regurgitation is not visualized. Aorta: The aortic root is normal in size and structure. Venous: The inferior  vena cava is dilated in size with greater than 50% respiratory variability, suggesting right atrial pressure of 8 mmHg. IAS/Shunts: No atrial level shunt detected by color flow Doppler.  LEFT VENTRICLE PLAX 2D LVIDd:         4.20 cm LVIDs:         2.40 cm LV PW:         1.00 cm LV IVS:        1.00 cm LVOT diam:     1.60 cm LV SV:         45 LV SV Index:   22 LVOT Area:     2.01 cm  RIGHT VENTRICLE TAPSE (M-mode): 1.5 cm LEFT ATRIUM             Index       RIGHT ATRIUM           Index LA diam:        4.50 cm 2.24 cm/m  RA Area:     25.30 cm LA Vol (A2C):   57.4 ml 28.52 ml/m RA Volume:   85.60 ml  42.53 ml/m LA Vol (A4C):   59.5 ml 29.56 ml/m LA Biplane Vol: 61.8 ml 30.71 ml/m  AORTIC VALVE LVOT Vmax:   115.00 cm/s LVOT Vmean:  77.300 cm/s LVOT VTI:    0.222 m  AORTA Ao Root diam: 2.90 cm MITRAL VALVE                TRICUSPID VALVE MV Area (PHT): 3.16 cm     TR Peak grad:   26.8 mmHg MV Decel Time: 240 msec     TR Vmax:        259.00 cm/s MV E velocity: 183.00 cm/s                             SHUNTS                             Systemic VTI:  0.22 m                             Systemic Diam: 1.60 cm Dorris Carnes MD Electronically signed by Dorris Carnes MD Signature Date/Time: 04/01/2020/5:04:43 PM    Final    [place] EKG SITE RITE  Result Date: 04/03/2020 If Site Rite image not attached, placement could not be confirmed due to current cardiac rhythm.  [place] EKG SITE RITE  Result Date: 03/30/2020 If Site Rite image not attached, placement could not be confirmed due to current cardiac rhythm.      Subjective: Patient is feeling well.  She is eager to leave.  No confusion, fever, vomiting.  No rashes.  Discharge Exam: Vitals:   04/03/20 0642 04/03/20 0722  BP: (!) 157/88   Pulse: 80   Resp: 18   Temp: (!) 97.4 F (36.3 C)   SpO2: 98% 98%   Vitals:   04/02/20 2232 04/03/20 0142 04/03/20 0642 04/03/20 0722  BP: 135/84 124/80 (!) 157/88   Pulse: 96 65 80   Resp: '18 18 18   ' Temp: (!) 97.5 F  (36.4  C) 97.9 F (36.6 C) (!) 97.4 F (36.3 C)   TempSrc:  Oral Oral   SpO2: 95% 97% 98% 98%  Weight:   97.2 kg   Height:        General: Pt is alert, awake, not in acute distress, sitting on the edge of the bed eating breakfast Cardiovascular: RRR, nl S1-S2, no murmurs appreciated.   No LE edema.   Respiratory: Normal respiratory rate and rhythm.  CTAB without rales or wheezes. Abdominal: Abdomen soft and non-tender.  No distension or HSM.   Neuro/Psych: Strength symmetric in upper and lower extremities.  Judgment and insight appear normal.   The results of significant diagnostics from this hospitalization (including imaging, microbiology, ancillary and laboratory) are listed below for reference.     Microbiology: Recent Results (from the past 240 hour(s))  Blood culture (routine single)     Status: Abnormal   Collection Time: 03/30/20 12:35 AM   Specimen: BLOOD RIGHT FOREARM  Result Value Ref Range Status   Specimen Description   Final    BLOOD RIGHT FOREARM Performed at [facility], [address], [place], [place] [address]    Special Requests   Final    BOTTLES DRAWN AEROBIC AND ANAEROBIC Blood Culture adequate volume Performed at [facility] [place], [place] [address]    Culture  Setup Time   Final    GRAM POSITIVE COCCI IN CHAINS Gram Stain Report Called to,Read Back By and Verified With: HOLCOMBE,R AT 9242 ON 03/30/20 BY HUFFINES,S IN BOTH AEROBIC AND ANAEROBIC BOTTLES CRITICAL RESULT CALLED TO, READ BACK BY AND VERIFIED WITH: Sharyon Cable RN 03/30/20 AT 6834 SK Performed at [facility], [place] [address], [place],  [address]    Culture GROUP B STREP(S.AGALACTIAE)ISOLATED (A)  Final   Report Status 04/01/2020 FINAL  Final   Organism ID, Bacteria GROUP B STREP(S.AGALACTIAE)ISOLATED  Final      Susceptibility   Group b strep(s.agalactiae)isolated - MIC*    CLINDAMYCIN RESISTANT Resistant     AMPICILLIN <=0.25 SENSITIVE Sensitive      ERYTHROMYCIN 2 RESISTANT Resistant     VANCOMYCIN 0.5 SENSITIVE Sensitive     LEVOFLOXACIN 1 SENSITIVE Sensitive     PENICILLIN Value in next row Sensitive      SENSITIVE0.12    * GROUP B STREP(S.AGALACTIAE)ISOLATED  Blood Culture ID Panel (Reflexed)     Status: Abnormal   Collection Time: 03/30/20 12:35 AM  Result Value Ref Range Status   Enterococcus faecalis NOT DETECTED NOT DETECTED Final   Enterococcus Faecium NOT DETECTED NOT DETECTED Final   Listeria monocytogenes NOT DETECTED NOT DETECTED Final   Staphylococcus species NOT DETECTED NOT DETECTED Final   Staphylococcus aureus (BCID) NOT DETECTED NOT DETECTED Final   Staphylococcus epidermidis NOT DETECTED NOT DETECTED Final   Staphylococcus lugdunensis NOT DETECTED NOT DETECTED Final   Streptococcus species DETECTED (A) NOT DETECTED Final    Comment: CRITICAL RESULT CALLED TO, READ BACK BY AND VERIFIED WITH: Sharyon Cable RN 03/30/20 AT 1808 SK    Streptococcus agalactiae DETECTED (A) NOT DETECTED Final    Comment: CRITICAL RESULT CALLED TO, READ BACK BY AND VERIFIED WITH: Sharyon Cable RN 03/30/20 AT 1808 SK    Streptococcus pneumoniae NOT DETECTED NOT DETECTED Final   Streptococcus pyogenes NOT DETECTED NOT DETECTED Final   A.calcoaceticus-baumannii NOT DETECTED NOT DETECTED Final   Bacteroides fragilis NOT DETECTED NOT DETECTED Final   Enterobacterales NOT DETECTED NOT DETECTED Final   Enterobacter cloacae complex  NOT DETECTED NOT DETECTED Final   Escherichia coli NOT DETECTED NOT DETECTED Final   Klebsiella aerogenes NOT DETECTED NOT DETECTED Final   Klebsiella oxytoca NOT DETECTED NOT DETECTED Final   Klebsiella pneumoniae NOT DETECTED NOT DETECTED Final   Proteus species NOT DETECTED NOT DETECTED Final   Salmonella species NOT DETECTED NOT DETECTED Final   Serratia marcescens NOT DETECTED NOT DETECTED Final   Haemophilus influenzae NOT DETECTED NOT DETECTED Final   Neisseria meningitidis NOT DETECTED NOT DETECTED Final    Pseudomonas aeruginosa NOT DETECTED NOT DETECTED Final   Stenotrophomonas maltophilia NOT DETECTED NOT DETECTED Final   Candida albicans NOT DETECTED NOT DETECTED Final   Candida auris NOT DETECTED NOT DETECTED Final   Candida glabrata NOT DETECTED NOT DETECTED Final   Candida krusei NOT DETECTED NOT DETECTED Final   Candida parapsilosis NOT DETECTED NOT DETECTED Final   Candida tropicalis NOT DETECTED NOT DETECTED Final   Cryptococcus neoformans/gattii NOT DETECTED NOT DETECTED Final    Comment: Performed at [facility], [place] [address] [place], [place] [address]  Resp Panel by RT-PCR (Flu A&B, Covid) Nasopharyngeal Swab     Status: None   Collection Time: 03/30/20 12:39 AM   Specimen: Nasopharyngeal Swab; Nasopharyngeal(NP) swabs in vial transport medium  Result Value Ref Range Status   SARS Coronavirus 2 by RT PCR NEGATIVE NEGATIVE Final    Comment: (NOTE) SARS-CoV-2 target nucleic acids are NOT DETECTED.  The SARS-CoV-2 RNA is generally detectable in upper respiratory specimens during the acute phase of infection. The lowest concentration of SARS-CoV-2 viral copies this assay can detect is 138 copies/mL. A negative result does not preclude SARS-Cov-2 infection and should not be used as the sole basis for treatment or other patient management decisions. A negative result may occur with  improper specimen collection/handling, submission of specimen other than nasopharyngeal swab, presence of viral mutation(s) within the areas targeted by this assay, and inadequate number of viral copies(<138 copies/mL). A negative result must be combined with clinical observations, patient history, and epidemiological information. The expected result is Negative.  Fact Sheet for Patients:  EntrepreneurPulse.com.au  Fact Sheet for Healthcare Providers:  IncredibleEmployment.be  This test is no t yet approved or cleared by the [place] FDA and  has  been authorized for detection and/or diagnosis of SARS-CoV-2 by FDA under an Emergency Use Authorization (EUA). This EUA will remain  in effect (meaning this test can be used) for the duration of the COVID-19 declaration under Section 564(b)(1) of the Act, 21 U.S.C.section 360bbb-3(b)(1), unless the authorization is terminated  or revoked sooner.       Influenza A by PCR NEGATIVE NEGATIVE Final   Influenza B by PCR NEGATIVE NEGATIVE Final    Comment: (NOTE) The Xpert Xpress SARS-CoV-2/FLU/RSV plus assay is intended as an aid in the diagnosis of influenza from Nasopharyngeal swab specimens and should not be used as a sole basis for treatment. Nasal washings and aspirates are unacceptable for Xpert Xpress SARS-CoV-2/FLU/RSV testing.  Fact Sheet for Patients: EntrepreneurPulse.com.au  Fact Sheet for Healthcare Providers: IncredibleEmployment.be  This test is not yet approved or cleared by the [place] FDA and has been authorized for detection and/or diagnosis of SARS-CoV-2 by FDA under an Emergency Use Authorization (EUA). This EUA will remain in effect (meaning this test can be used) for the duration of the COVID-19 declaration under Section 564(b)(1) of the Act, 21 U.S.C. section 360bbb-3(b)(1), unless the authorization is terminated or revoked.  Performed at [facility]  [facility], [address] [place], [place] [address]   Urine culture     Status: Abnormal   Collection Time: 03/30/20 12:51 AM   Specimen: In/Out Cath Urine  Result Value Ref Range Status   Specimen Description   Final    IN/OUT CATH URINE Performed at [facility], [address], [place], [place] [address]    Special Requests   Final    NONE Performed at [facility], [address] [place], [place] [address]    Culture (A)  Final    <10,000 COLONIES/mL INSIGNIFICANT GROWTH Performed at [place] [address], [place], [place] [address]    Report Status  03/31/2020 FINAL  Final  MRSA PCR Screening     Status: None   Collection Time: 03/31/20  6:13 PM   Specimen: Nasopharyngeal  Result Value Ref Range Status   MRSA by PCR NEGATIVE NEGATIVE Final    Comment:        The GeneXpert MRSA Assay (FDA approved for NASAL specimens only), is one component of a comprehensive MRSA colonization surveillance program. It is not intended to diagnose MRSA infection nor to guide or monitor treatment for MRSA infections. Performed at [facility], [address], [place], [place] [address]   Culture, blood (routine x 2)     Status: None (Preliminary result)   Collection Time: 03/31/20  7:50 PM   Specimen: BLOOD LEFT HAND  Result Value Ref Range Status   Specimen Description BLOOD LEFT HAND  Final   Special Requests   Final    BOTTLES DRAWN AEROBIC AND ANAEROBIC Blood Culture adequate volume   Culture   Final    NO GROWTH 3 DAYS Performed at [facility], [address] [place], [place] [address]    Report Status PENDING  Incomplete  Culture, blood (routine x 2)     Status: None (Preliminary result)   Collection Time: 03/31/20  7:58 PM   Specimen: BLOOD LEFT WRIST  Result Value Ref Range Status   Specimen Description BLOOD LEFT WRIST  Final   Special Requests   Final    BOTTLES DRAWN AEROBIC AND ANAEROBIC Blood Culture adequate volume   Culture   Final    NO GROWTH 3 DAYS Performed at [facility], [address], [place], [place] [address]    Report Status PENDING  Incomplete  SARS CORONAVIRUS 2 (TAT 6-24 HRS) Nasopharyngeal Nasopharyngeal Swab     Status: None   Collection Time: 04/02/20 11:12 AM   Specimen: Nasopharyngeal Swab  Result Value Ref Range Status   SARS Coronavirus 2 NEGATIVE NEGATIVE Final    Comment: (NOTE) SARS-CoV-2 target nucleic acids are NOT DETECTED.  The SARS-CoV-2 RNA is generally detectable in upper and lower respiratory specimens during the acute phase of infection. Negative results do not preclude SARS-CoV-2  infection, do not rule out co-infections with other pathogens, and should not be used as the sole basis for treatment or other patient management decisions. Negative results must be combined with clinical observations, patient history, and epidemiological information. The expected result is Negative.  Fact Sheet for Patients: SugarRoll.be  Fact Sheet for Healthcare Providers: https://www.woods-mathews.com/  This test is not yet approved or cleared by the [place] FDA and  has been authorized for detection and/or diagnosis of SARS-CoV-2 by FDA under an Emergency Use Authorization (EUA). This EUA will remain  in effect (meaning this test can be used) for the duration of the COVID-19 declaration under Se ction 564(b)(1) of the Act,  21 U.S.C. section 360bbb-3(b)(1), unless the authorization is terminated or revoked sooner.  Performed at [facility], [place] [address] [place], [place] [address]      Labs: BNP (last 3 results) No results for input(s): BNP in the last 8760 hours. Basic Metabolic Panel: Recent Labs  Lab 03/30/20 0035 03/30/20 0716 03/31/20 1014 04/01/20 0218 04/01/20 1247 04/02/20 0514 04/03/20 0530  NA 138 136 137 139  --  142 141  K 3.8 3.7 4.4 3.8  --  3.2* 3.1*  CL 100 100 103 105  --  106 105  CO2 '23 23 22 23  ' --  23 25  GLUCOSE 133* 163* 155* 202*  --  159* 178*  BUN '23 21 16 17  ' --  20 17  CREATININE 1.05* 1.03* 0.93 0.78  --  0.94 0.73  CALCIUM 9.5 8.6* 8.2* 8.6*  --  8.8* 8.9  MG 1.4*  --   --   --  2.0 1.9  --   PHOS 2.4*  --   --   --   --  2.7  --    Liver Function Tests: Recent Labs  Lab 03/30/20 0035 03/31/20 1014 04/01/20 0218  AST 18 42* 26  ALT '12 9 15  ' ALKPHOS 90 69 77  BILITOT 0.8 1.6* 1.0  PROT 7.5 6.0* 6.4*  ALBUMIN 3.6 2.7* 2.8*   No results for input(s): LIPASE, AMYLASE in the last 168 hours. No results for input(s): AMMONIA in the last 168 hours. CBC: Recent Labs  Lab  03/30/20 0035 03/30/20 0900 03/31/20 1014 04/01/20 0218 04/02/20 0514 04/03/20 0530  WBC 21.0* 32.6* 14.8* 11.2* 8.3 7.9  NEUTROABS 18.4*  --   --   --   --   --   HGB 12.5 10.6* 11.2* 10.9* 9.9* 9.6*  HCT 40.9 36.6 38.5 36.6 33.2* 32.7*  MCV 86.5 89.7 89.7 88.2 89.5 87.2  PLT 358 294 205 258 277 280   Cardiac Enzymes: No results for input(s): CKTOTAL, CKMB, CKMBINDEX, TROPONINI in the last 168 hours. BNP: Invalid input(s): POCBNP CBG: Recent Labs  Lab 04/02/20 1123 04/02/20 1602 04/02/20 2118 04/03/20 0751 04/03/20 1113  GLUCAP 225* 148* 188* 191* 248*   D-Dimer No results for input(s): DDIMER in the last 72 hours. Hgb A1c Recent Labs    04/01/20 0218  HGBA1C 6.9*   Lipid Profile No results for input(s): CHOL, HDL, LDLCALC, TRIG, CHOLHDL, LDLDIRECT in the last 72 hours. Thyroid function studies No results for input(s): TSH, T4TOTAL, T3FREE, THYROIDAB in the last 72 hours.  Invalid input(s): FREET3 Anemia work up No results for input(s): VITAMINB12, FOLATE, FERRITIN, TIBC, IRON, RETICCTPCT in the last 72 hours. Urinalysis    Component Value Date/Time   COLORURINE YELLOW 03/30/2020 0051   APPEARANCEUR CLEAR 03/30/2020 0051   LABSPEC 1.020 03/30/2020 0051   PHURINE 5.0 03/30/2020 0051   GLUCOSEU NEGATIVE 03/30/2020 0051   HGBUR NEGATIVE 03/30/2020 0051   BILIRUBINUR NEGATIVE 03/30/2020 0051   KETONESUR NEGATIVE 03/30/2020 0051   PROTEINUR 30 (A) 03/30/2020 0051   UROBILINOGEN 1.0 10/09/2014 0040   NITRITE NEGATIVE 03/30/2020 0051   LEUKOCYTESUR NEGATIVE 03/30/2020 0051   Sepsis Labs Invalid input(s): PROCALCITONIN,  WBC,  LACTICIDVEN Microbiology Recent Results (from the past 240 hour(s))  Blood culture (routine single)     Status: Abnormal   Collection Time: 03/30/20 12:35 AM   Specimen: BLOOD RIGHT FOREARM  Result Value Ref Range Status   Specimen Description   Final    BLOOD  RIGHT FOREARM Performed at [facility], [address],  [place], [place] [address]    Special Requests   Final    BOTTLES DRAWN AEROBIC AND ANAEROBIC Blood Culture adequate volume Performed at [place]., [place], [place] [address]    Culture  Setup Time   Final    GRAM POSITIVE COCCI IN CHAINS Gram Stain Report Called to,Read Back By and Verified With: HOLCOMBE,R AT 4193 ON 03/30/20 BY HUFFINES,S IN BOTH AEROBIC AND ANAEROBIC BOTTLES CRITICAL RESULT CALLED TO, READ BACK BY AND VERIFIED WITH: Sharyon Cable RN 03/30/20 AT 7902 SK Performed at [facility], [place] [address] [place], [place] [address]    Culture GROUP B STREP(S.AGALACTIAE)ISOLATED (A)  Final   Report Status 04/01/2020 FINAL  Final   Organism ID, Bacteria GROUP B STREP(S.AGALACTIAE)ISOLATED  Final      Susceptibility   Group b strep(s.agalactiae)isolated - MIC*    CLINDAMYCIN RESISTANT Resistant     AMPICILLIN <=0.25 SENSITIVE Sensitive     ERYTHROMYCIN 2 RESISTANT Resistant     VANCOMYCIN 0.5 SENSITIVE Sensitive     LEVOFLOXACIN 1 SENSITIVE Sensitive     PENICILLIN Value in next row Sensitive      SENSITIVE0.12    * GROUP B STREP(S.AGALACTIAE)ISOLATED  Blood Culture ID Panel (Reflexed)     Status: Abnormal   Collection Time: 03/30/20 12:35 AM  Result Value Ref Range Status   Enterococcus faecalis NOT DETECTED NOT DETECTED Final   Enterococcus Faecium NOT DETECTED NOT DETECTED Final   Listeria monocytogenes NOT DETECTED NOT DETECTED Final   Staphylococcus species NOT DETECTED NOT DETECTED Final   Staphylococcus aureus (BCID) NOT DETECTED NOT DETECTED Final   Staphylococcus epidermidis NOT DETECTED NOT DETECTED Final   Staphylococcus lugdunensis NOT DETECTED NOT DETECTED Final   Streptococcus species DETECTED (A) NOT DETECTED Final    Comment: CRITICAL RESULT CALLED TO, READ BACK BY AND VERIFIED WITH: Sharyon Cable RN 03/30/20 AT 1808 SK    Streptococcus agalactiae DETECTED (A) NOT DETECTED Final    Comment: CRITICAL RESULT CALLED TO, READ BACK BY AND  VERIFIED WITH: Sharyon Cable RN 03/30/20 AT 1808 SK    Streptococcus pneumoniae NOT DETECTED NOT DETECTED Final   Streptococcus pyogenes NOT DETECTED NOT DETECTED Final   A.calcoaceticus-baumannii NOT DETECTED NOT DETECTED Final   Bacteroides fragilis NOT DETECTED NOT DETECTED Final   Enterobacterales NOT DETECTED NOT DETECTED Final   Enterobacter cloacae complex NOT DETECTED NOT DETECTED Final   Escherichia coli NOT DETECTED NOT DETECTED Final   Klebsiella aerogenes NOT DETECTED NOT DETECTED Final   Klebsiella oxytoca NOT DETECTED NOT DETECTED Final   Klebsiella pneumoniae NOT DETECTED NOT DETECTED Final   Proteus species NOT DETECTED NOT DETECTED Final   Salmonella species NOT DETECTED NOT DETECTED Final   Serratia marcescens NOT DETECTED NOT DETECTED Final   Haemophilus influenzae NOT DETECTED NOT DETECTED Final   Neisseria meningitidis NOT DETECTED NOT DETECTED Final   Pseudomonas aeruginosa NOT DETECTED NOT DETECTED Final   Stenotrophomonas maltophilia NOT DETECTED NOT DETECTED Final   Candida albicans NOT DETECTED NOT DETECTED Final   Candida auris NOT DETECTED NOT DETECTED Final   Candida glabrata NOT DETECTED NOT DETECTED Final   Candida krusei NOT DETECTED NOT DETECTED Final   Candida parapsilosis NOT DETECTED NOT DETECTED Final   Candida tropicalis NOT DETECTED NOT DETECTED Final   Cryptococcus neoformans/gattii NOT DETECTED NOT DETECTED Final    Comment: Performed at [facility], [address],  [place] [address]  Resp Panel by RT-PCR (Flu A&B, Covid) Nasopharyngeal Swab     Status: None   Collection Time: 03/30/20 12:39 AM   Specimen: Nasopharyngeal Swab; Nasopharyngeal(NP) swabs in vial transport medium  Result Value Ref Range Status   SARS Coronavirus 2 by RT PCR NEGATIVE NEGATIVE Final    Comment: (NOTE) SARS-CoV-2 target nucleic acids are NOT DETECTED.  The SARS-CoV-2 RNA is generally detectable in upper respiratory specimens during the acute phase of  infection. The lowest concentration of SARS-CoV-2 viral copies this assay can detect is 138 copies/mL. A negative result does not preclude SARS-Cov-2 infection and should not be used as the sole basis for treatment or other patient management decisions. A negative result may occur with  improper specimen collection/handling, submission of specimen other than nasopharyngeal swab, presence of viral mutation(s) within the areas targeted by this assay, and inadequate number of viral copies(<138 copies/mL). A negative result must be combined with clinical observations, patient history, and epidemiological information. The expected result is Negative.  Fact Sheet for Patients:  EntrepreneurPulse.com.au  Fact Sheet for Healthcare Providers:  IncredibleEmployment.be  This test is no t yet approved or cleared by the [place] FDA and  has been authorized for detection and/or diagnosis of SARS-CoV-2 by FDA under an Emergency Use Authorization (EUA). This EUA will remain  in effect (meaning this test can be used) for the duration of the COVID-19 declaration under Section 564(b)(1) of the Act, 21 U.S.C.section 360bbb-3(b)(1), unless the authorization is terminated  or revoked sooner.       Influenza A by PCR NEGATIVE NEGATIVE Final   Influenza B by PCR NEGATIVE NEGATIVE Final    Comment: (NOTE) The Xpert Xpress SARS-CoV-2/FLU/RSV plus assay is intended as an aid in the diagnosis of influenza from Nasopharyngeal swab specimens and should not be used as a sole basis for treatment. Nasal washings and aspirates are unacceptable for Xpert Xpress SARS-CoV-2/FLU/RSV testing.  Fact Sheet for Patients: EntrepreneurPulse.com.au  Fact Sheet for Healthcare Providers: IncredibleEmployment.be  This test is not yet approved or cleared by the [place] FDA and has been authorized for detection and/or diagnosis of SARS-CoV-2  by FDA under an Emergency Use Authorization (EUA). This EUA will remain in effect (meaning this test can be used) for the duration of the COVID-19 declaration under Section 564(b)(1) of the Act, 21 U.S.C. section 360bbb-3(b)(1), unless the authorization is terminated or revoked.  Performed at [facility], [address] [place], [place] [address]   Urine culture     Status: Abnormal   Collection Time: 03/30/20 12:51 AM   Specimen: In/Out Cath Urine  Result Value Ref Range Status   Specimen Description   Final    IN/OUT CATH URINE Performed at [facility], [address] [place], [place] [address]    Special Requests   Final    NONE Performed at [facility], [address], [place], [place] [address]    Culture (A)  Final    <10,000 COLONIES/mL INSIGNIFICANT GROWTH Performed at [place] [address] [place], [place] [address]    Report Status 03/31/2020 FINAL  Final  MRSA PCR Screening     Status: None   Collection Time: 03/31/20  6:13 PM   Specimen: Nasopharyngeal  Result Value Ref Range Status   MRSA by PCR NEGATIVE NEGATIVE Final    Comment:        The GeneXpert MRSA Assay (FDA approved for NASAL specimens only), is one component of a comprehensive  MRSA colonization surveillance program. It is not intended to diagnose MRSA infection nor to guide or monitor treatment for MRSA infections. Performed at [facility], [address] [place], [place] [address]   Culture, blood (routine x 2)     Status: None (Preliminary result)   Collection Time: 03/31/20  7:50 PM   Specimen: BLOOD LEFT HAND  Result Value Ref Range Status   Specimen Description BLOOD LEFT HAND  Final   Special Requests   Final    BOTTLES DRAWN AEROBIC AND ANAEROBIC Blood Culture adequate volume   Culture   Final    NO GROWTH 3 DAYS Performed at [facility], [address], [place], [place] [address]    Report Status PENDING  Incomplete  Culture, blood (routine x 2)     Status: None  (Preliminary result)   Collection Time: 03/31/20  7:58 PM   Specimen: BLOOD LEFT WRIST  Result Value Ref Range Status   Specimen Description BLOOD LEFT WRIST  Final   Special Requests   Final    BOTTLES DRAWN AEROBIC AND ANAEROBIC Blood Culture adequate volume   Culture   Final    NO GROWTH 3 DAYS Performed at [facility], [address] [place], [place] [address]    Report Status PENDING  Incomplete  SARS CORONAVIRUS 2 (TAT 6-24 HRS) Nasopharyngeal Nasopharyngeal Swab     Status: None   Collection Time: 04/02/20 11:12 AM   Specimen: Nasopharyngeal Swab  Result Value Ref Range Status   SARS Coronavirus 2 NEGATIVE NEGATIVE Final    Comment: (NOTE) SARS-CoV-2 target nucleic acids are NOT DETECTED.  The SARS-CoV-2 RNA is generally detectable in upper and lower respiratory specimens during the acute phase of infection. Negative results do not preclude SARS-CoV-2 infection, do not rule out co-infections with other pathogens, and should not be used as the sole basis for treatment or other patient management decisions. Negative results must be combined with clinical observations, patient history, and epidemiological information. The expected result is Negative.  Fact Sheet for Patients: SugarRoll.be  Fact Sheet for Healthcare Providers: https://www.woods-mathews.com/  This test is not yet approved or cleared by the [place] FDA and  has been authorized for detection and/or diagnosis of SARS-CoV-2 by FDA under an Emergency Use Authorization (EUA). This EUA will remain  in effect (meaning this test can be used) for the duration of the COVID-19 declaration under Se ction 564(b)(1) of the Act, 21 U.S.C. section 360bbb-3(b)(1), unless the authorization is terminated or revoked sooner.  Performed at [facility], [place] [address], [place], [place] [address]      Time coordinating discharge: 35 minutes The [place] controlled substances  registry was reviewed for this patient prior to filling the <5 days supply controlled substances script.      SIGNED:   Edwin Dada, MD  [facility] 04/03/2020, 11:57 AM

## 2020-04-03 NOTE — Progress Notes (Signed)
OT Cancellation Note  Patient Details Name: [name] MRN: [id number] DOB: 22-Apr-1945   Cancelled Treatment:    Reason Eval/Treat Not Completed: OT screened, no needs identified, will sign off. Pt sitting on EOB with nursing present for medication administering upon therapy arrival. Patient reports that she is ready to return to [facility] where she resides. She states that she has a scooter, manual w/c, and 2WW. She receives supervision for basic ADL tasks although reports she usually completes bathing and dressing at modified independent level. 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 She states she is able to independently manage the pain with use of braces or massage. No deficits identified by patient at this time. Informed patient that she may be evaluated by staff OT once she returns to SNF to assess any need for therapy services. Patient voices understanding. No further OT needs at this time.    Limmie Patricia, OTR/L,CBIS  [phone]  04/03/2020, 10:37 AM

## 2020-04-03 NOTE — Progress Notes (Addendum)
PHARMACY CONSULT NOTE FOR:   OUTPATIENT  PARENTERAL ANTIBIOTIC THERAPY (OPAT)  Indication: bacteremia  Regimen: ceftriaxone 2g IV q24h End date:  04/13/20  IV antibiotic discharge orders are pended. To discharging provider:  please sign these orders via discharge navigator,  Select New Orders & click on the button choice - Manage This Unsigned Work.     Thank you for allowing pharmacy to be a part of this patient's care.  Tama High 04/03/2020, 10:40 AM

## 2020-04-03 NOTE — Progress Notes (Signed)
Peripherally Inserted Central Catheter Placement  The IV Nurse has discussed with the patient and/or persons authorized to consent for the patient, the purpose of this procedure and the potential benefits and risks involved with this procedure.  The benefits include less needle sticks, lab draws from the catheter, and the patient may be discharged home with the catheter. Risks include, but not limited to, infection, bleeding, blood clot (thrombus formation), and puncture of an artery; nerve damage and irregular heartbeat and possibility to perform a PICC exchange if needed/ordered by physician.  Alternatives to this procedure were also discussed.  Bard Power PICC patient education guide, fact sheet on infection prevention and patient information card has been provided to patient /or left at bedside.    PICC Placement Documentation  PICC Single Lumen 04/03/20 PICC Right Cephalic 44 cm 0 cm (Active)  Indication for Insertion or Continuance of Line Home intravenous therapies (PICC only) 04/03/20 1229  Exposed Catheter (cm) 0 cm 04/03/20 1229  Site Assessment Clean;Dry;Intact 04/03/20 1229  Line Status Blood return noted;Flushed;Saline locked 04/03/20 1229  Dressing Type Transparent 04/03/20 1229  Dressing Status Clean;Dry;Intact 04/03/20 1229  Antimicrobial disc in place? Yes 04/03/20 1229  Safety Lock Not Applicable 04/03/20 1229  Dressing Change Due 04/10/20 04/03/20 1229       Marisa Sprinkles 04/03/2020, 12:30 PM

## 2020-04-03 NOTE — TOC Transition Note (Signed)
Transition of Care [facility]) - CM/SW Discharge Note   Patient Details  Name: [name] MRN: [id number] Date of Birth: 1945-08-31  Transition of Care [facility]) CM/SW Contact:  Leitha Bleak, RN Phone Number: 04/03/2020, 2:24 PM   Clinical Narrative:   Patient returning to [facility]. Confirmed with Melissa that patient could return with PICC line and antibiotic orders. Number for report provided. Clinical sent in the hub. EMS called and medical necessity printed. RN to call report.  Final next level of care: Skilled Nursing Facility Barriers to Discharge: Barriers Resolved   Patient Goals and CMS Choice Patient states their goals for this hospitalization and ongoing recovery are:: Return to [facility].gov Compare Post Acute Care list provided to:: Patient Choice offered to / list presented to : Patient  Discharge Placement              Patient chooses bed at: Other - please specify in the comment section below: [facility]) Patient to be transferred to facility by: EMS Name of family member notified: Francis Dowse Patient and family notified of of transfer: 04/03/20  Discharge Plan and Services In-house Referral: Clinical Social Work Discharge Planning Services: NA Post Acute Care Choice: Skilled Nursing Facility (Patient is a LTC resident of [facility])          DME Arranged: N/A DME Agency: NA       HH Arranged: NA HH Agency: NA       Readmission Risk Interventions Readmission Risk Prevention Plan 04/01/2020  Transportation Screening Complete  PCP or Specialist Appt within 3-5 Days Not Complete  Not Complete comments Patient's PCP appointments are set up at her facility  [facility] or Home Care Consult Complete  Social Work Consult for Recovery Care Planning/Counseling Complete  Palliative Care Screening Complete  Medication Review Oceanographer) Complete  Some recent data might be hidden

## 2020-04-03 NOTE — Care Management Important Message (Signed)
Important Message  Patient Details  Name: [name] MRN: [id number] Date of Birth: March 25, 1946   Medicare Important Message Given:  Yes     Corey Harold 04/03/2020, 1:17 PM

## 2020-04-04 ENCOUNTER — Inpatient Hospital Stay: Payer: Self-pay

## 2020-04-05 LAB — CULTURE, BLOOD (ROUTINE X 2)
Culture: NO GROWTH
Culture: NO GROWTH
Special Requests: ADEQUATE
Special Requests: ADEQUATE

## 2020-04-09 NOTE — Progress Notes (Signed)
Of note, the patient did have an acute metabolic encephalopathy due to her bacteremia.  She did NOT have a urinary tract infection.

## 2020-09-21 ENCOUNTER — Encounter: Payer: Self-pay | Admitting: Internal Medicine

## 2020-10-22 ENCOUNTER — Telehealth: Payer: Self-pay | Admitting: Internal Medicine

## 2020-10-22 ENCOUNTER — Ambulatory Visit: Payer: Medicare Other | Admitting: Internal Medicine

## 2020-10-22 ENCOUNTER — Encounter: Payer: Self-pay | Admitting: Internal Medicine

## 2021-02-01 ENCOUNTER — Ambulatory Visit: Payer: Medicare Other | Admitting: Gastroenterology

## 2021-05-05 ENCOUNTER — Emergency Department (HOSPITAL_COMMUNITY): Payer: 59

## 2021-05-05 ENCOUNTER — Other Ambulatory Visit: Payer: Self-pay

## 2021-05-05 ENCOUNTER — Emergency Department (HOSPITAL_COMMUNITY)
Admission: EM | Admit: 2021-05-05 | Discharge: 2021-05-06 | Disposition: A | Discharge status: Skilled Nursing Facility | Payer: 59 | Attending: Emergency Medicine | Admitting: Emergency Medicine

## 2021-05-05 ENCOUNTER — Encounter (HOSPITAL_COMMUNITY): Payer: Self-pay

## 2021-05-05 DIAGNOSIS — E1122 Type 2 diabetes mellitus with diabetic chronic kidney disease: Secondary | ICD-10-CM | POA: Diagnosis not present

## 2021-05-05 DIAGNOSIS — S0990XA Unspecified injury of head, initial encounter: Secondary | ICD-10-CM | POA: Diagnosis present

## 2021-05-05 DIAGNOSIS — I5032 Chronic diastolic (congestive) heart failure: Secondary | ICD-10-CM | POA: Insufficient documentation

## 2021-05-05 DIAGNOSIS — I110 Hypertensive heart disease with heart failure: Secondary | ICD-10-CM | POA: Diagnosis not present

## 2021-05-05 DIAGNOSIS — S199XXA Unspecified injury of neck, initial encounter: Secondary | ICD-10-CM | POA: Insufficient documentation

## 2021-05-05 DIAGNOSIS — S060X0A Concussion without loss of consciousness, initial encounter: Secondary | ICD-10-CM | POA: Diagnosis not present

## 2021-05-05 DIAGNOSIS — N183 Chronic kidney disease, stage 3 unspecified: Secondary | ICD-10-CM | POA: Diagnosis not present

## 2021-05-05 DIAGNOSIS — Z794 Long term (current) use of insulin: Secondary | ICD-10-CM | POA: Insufficient documentation

## 2021-05-05 DIAGNOSIS — Z79899 Other long term (current) drug therapy: Secondary | ICD-10-CM | POA: Diagnosis not present

## 2021-05-05 DIAGNOSIS — W01198A Fall on same level from slipping, tripping and stumbling with subsequent striking against other object, initial encounter: Secondary | ICD-10-CM | POA: Diagnosis not present

## 2021-05-05 DIAGNOSIS — D631 Anemia in chronic kidney disease: Secondary | ICD-10-CM | POA: Insufficient documentation

## 2021-05-05 MED ORDER — ACETAMINOPHEN 325 MG PO TABS: 650.0000 mg | ORAL_TABLET | Freq: Once | ORAL | Status: DC

## 2021-05-05 NOTE — ED Triage Notes (Signed)
Pt from [facility] brought in by EMS for fall. Pt with hematoma to back of her head, denies LOC. Pt on xarelto.

## 2021-05-05 NOTE — ED Provider Notes (Signed)
[facility] EMERGENCY DEPARTMENT Provider Note   CSN: [id number] Arrival date & time: 05/05/21  1730     History  Chief Complaint  Patient presents with   [name] is a 76 y.o. female.  The history is provided by the patient.  Fall This is a new problem. The current episode started 2 days ago. The problem has not changed since onset.Associated symptoms include headaches. Pertinent negatives include no chest pain and no abdominal pain. Nothing aggravates the symptoms. Nothing relieves the symptoms.  Patient reports she fell 2 days ago at her nursing facility.  She reports she slipped from her chair and fell hitting the back of her head.  No LOC.  She does have mild headache. No vomiting.  No other acute complaints.  She can ambulate Patient does take Xarelto daily as she has history of atrial fibrillation  Past Medical History:  Diagnosis Date   Abnormal LFTs 07/24/2017   Actinic keratosis    Anemia    hx of   Anemia of chronic renal failure, stage 3 (moderate) ([place]) 07/17/2015   Anticoagulant causing adverse effect in therapeutic use 07/17/2015   Anxiety    Arthritis    rheumatoid   Atrial fibrillation ([place])    a. persistent, she remains on Xarelto   Carpal tunnel syndrome    Chronic diastolic (congestive) heart failure ([place])    a. 04/2016: echo showing a preserved EF of 55-60% with mild MR. LA and RA midly dilated.    Edema    left leg at ankle resolved now   Gastroesophageal reflux disease    Hepatitis 1970   not sure what kind   Hiatal hernia    Hyperlipidemia    Hypertension    Iron deficiency anemia due to chronic blood loss 07/17/2015   Jaundice    age 18   Peripheral vascular disease ([place]) yrs ago   DVT left lower leg questionale told by 2 drs i had no clot, 1 md said i did   Rheumatoid arthritis(714.0)    Right knee pain    Stroke ([place])    Type II diabetes mellitus ([place])    type2       Home Medications Prior to Admission medications    Medication Sig Start Date End Date Taking? Authorizing Provider  acetaminophen (TYLENOL) 650 MG CR tablet Take 1,300 mg by mouth in the morning and at bedtime.    [provider]  albuterol (VENTOLIN HFA) 108 (90 Base) MCG/ACT inhaler Inhale 2 puffs into the lungs every 4 (four) hours as needed for wheezing or shortness of breath.    [provider]  Amino Acids-Protein Hydrolys (FEEDING SUPPLEMENT, PRO-STAT 64,) LIQD Take 30 mLs by mouth in the morning.    [provider]  Ascorbic Acid (VITAMIN C PO) Take 500 mg by mouth in the morning and at bedtime.    [provider]  atorvastatin (LIPITOR) 20 MG tablet Take 20 mg by mouth daily. 01/27/20   [provider]  azelastine (OPTIVAR) 0.05 % ophthalmic solution Place 1 drop into both eyes 2 (two) times daily.    [provider]  buPROPion (WELLBUTRIN SR) 150 MG 12 hr tablet Take 100 mg by mouth 2 (two) times daily.    [provider]  carboxymethylcellulose (ARTIFICIAL TEARS) 1 % ophthalmic solution Place 2 drops into both eyes in the morning.    [provider]  Cholecalciferol (VITAMIN D3) 1000 units CAPS Take 2,000 Units  by mouth daily.    [provider]  Cream Base (REJUVACARE PLUS) CREA Apply 1 application topically in the morning and at bedtime. Apply to face for dry skin patches    [provider]  cycloSPORINE (RESTASIS) 0.05 % ophthalmic emulsion Place 1 drop into both eyes 2 (two) times daily.    [provider]  diclofenac Sodium (VOLTAREN) 1 % GEL Apply 2 g topically at bedtime as needed for pain. 03/15/20   [provider]  diltiazem (CARDIZEM CD) 240 MG 24 hr capsule Take 240 mg by mouth daily. 01/27/20   [provider]  Eyelid Cleansers (OCUSOFT LID SCRUB ORIGINAL) PADS Apply 1 application topically in the morning and at bedtime. Apply to both eyelids for eye problem    [provider]  fluticasone-salmeterol  (ADVAIR HFA) 115-21 MCG/ACT inhaler Inhale 2 puffs into the lungs 2 times daily at 12 noon and 4 pm.    [provider]  furosemide (LASIX) 40 MG tablet Take 40 mg by mouth daily.     [provider]  gabapentin (NEURONTIN) 100 MG capsule Take 3 capsules (300 mg total) by mouth 3 (three) times daily. 10/10/17   Kathie Dike, MD  HYDROcodone-acetaminophen (NORCO/VICODIN) 5-325 MG tablet Take 1 tablet by mouth every 6 (six) hours as needed. 04/03/20   Danford, Suann Larry, MD  insulin glargine (LANTUS) 100 UNIT/ML injection Inject 30 Units into the skin in the morning. Hold if BS is less than 150    [provider]  insulin lispro (HUMALOG) 100 UNIT/ML injection Inject 6 Units into the skin in the morning and at bedtime. Hold is BS <100    [provider]  lidocaine (LIDODERM) 5 % Place 1 patch onto the skin daily. Remove & Discard patch within 12 hours or as directed by MD    [provider]  metFORMIN (GLUCOPHAGE-XR) 500 MG 24 hr tablet Take 1,000 mg by mouth 2 (two) times daily. 07/31/17   [provider]  metoprolol tartrate (LOPRESSOR) 100 MG tablet Take 1 tablet (100 mg total) by mouth 3 (three) times daily. Patient taking differently: Take 100 mg by mouth 2 (two) times daily. 03/06/18   Barton Dubois, MD  Multiple Vitamins-Minerals (CERTAGEN PO) Take 1 tablet by mouth in the morning. For wound healing    [provider]  Multiple Vitamins-Minerals (PRESERVISION AREDS 2) CAPS Take 1 capsule by mouth daily.     [provider]  nitroGLYCERIN (NITROSTAT) 0.4 MG SL tablet Place 1 tablet (0.4 mg total) under the tongue every 5 (five) minutes as needed for chest pain (x 3 doses). 11/28/16   Lelon Perla, MD  omeprazole (PRILOSEC) 20 MG capsule Take 1 capsule by mouth daily. 02/12/18   [provider]  oxyCODONE (OXY IR/ROXICODONE) 5 MG immediate release tablet Take 1.5 tablets (7.5 mg total) by mouth every 6 (six)  hours as needed (Pain). 04/03/20   Danford, Suann Larry, MD  predniSONE (DELTASONE) 5 MG tablet Take 5 mg by mouth daily.    [provider]  Rivaroxaban (XARELTO) 15 MG TABS tablet Take 1 tablet (15 mg total) by mouth daily. Patient taking differently: Take 10 mg by mouth daily. 07/28/17   Kathrene Alu, MD  senna (SENOKOT) 8.6 MG TABS tablet Take 1 tablet by mouth daily.    [provider]  spironolactone (ALDACTONE) 25 MG tablet Take 12.5 mg by mouth in the morning. 01/27/20   [provider]  VASCEPA 1 g  CAPS Take 1 capsule by mouth every morning. 02/25/18   [provider]  VOLTAREN 1 % GEL Apply 2 g topically at bedtime as needed for pain. Knees, calf 04/27/17   [provider]  zinc sulfate 220 (50 Zn) MG capsule Take 220 mg by mouth daily.    [provider]  ferrous gluconate (FERGON) 324 MG tablet Take 1 tablet (324 mg total) by mouth 2 (two) times daily with a meal. Patient not taking: Reported on 03/30/2020 05/17/16 04/01/20  Debbe Odea, MD  potassium chloride SA (K-DUR,KLOR-CON) 20 MEQ tablet Take 1 tablet (20 mEq total) by mouth 2 (two) times daily. Patient not taking: Reported on 03/30/2020 10/10/17 04/01/20  Kathie Dike, MD      Allergies    Promethazine    Review of Systems   Review of Systems  Constitutional:  Negative for fever.  Cardiovascular:  Negative for chest pain.  Gastrointestinal:  Negative for abdominal pain and vomiting.  Neurological:  Positive for headaches.   Physical Exam Updated Vital Signs BP 119/71 (BP Location: Right Arm)    Pulse 96    Temp 97.9 F (36.6 C) (Oral)    Resp 16    Ht 1.702 m (5\' 7" )    Wt 83.9 kg    SpO2 94%    BMI 28.98 kg/m  Physical Exam CONSTITUTIONAL: Elderly, no acute distress HEAD: Hematoma noted to posterior scalp, no other finding EYES: EOMI/PERRL ENMT: Mucous membranes moist NECK: supple no meningeal signs SPINE/BACK:entire spine nontender CV: No loud  murmur LUNGS: Lungs are clear to auscultation bilaterally, no apparent distress ABDOMEN: soft\ NEURO: Pt is awake/alert/appropriate, moves all extremitiesx4.  No facial droop.  GCS 15 EXTREMITIES: pulses normal/equal, full ROM Full range of motion of both hips without any tenderness elicited All other extremities/joints palpated/ranged and nontender Chronic changes of rheumatoid arthritis noted in her hands SKIN: warm, color normal PSYCH: no abnormalities of mood noted, alert and oriented to situation  ED Results / Procedures / Treatments   Labs (all labs ordered are listed, but only abnormal results are displayed) Labs Reviewed - No data to display  EKG None  Radiology CT Head Wo Contrast  Result Date: 05/05/2021 CLINICAL DATA:  Head trauma, minor (Age >= 65y); Neck trauma (Age >= 65y) EXAM: CT HEAD WITHOUT CONTRAST CT CERVICAL SPINE WITHOUT CONTRAST TECHNIQUE: Multidetector CT imaging of the head and cervical spine was performed following the standard protocol without intravenous contrast. Multiplanar CT image reconstructions of the cervical spine were also generated. RADIATION DOSE REDUCTION: This exam was performed according to the departmental dose-optimization program which includes automated exposure control, adjustment of the mA and/or kV according to patient size and/or use of iterative reconstruction technique. COMPARISON:  CT head 10/23/2010, CT head 07/24/2017 FINDINGS: CT HEAD FINDINGS BRAIN: BRAIN Encephalomalacia of the right temporoparietal and occipital lobes again noted. No evidence of large-territorial acute infarction. No parenchymal hemorrhage. No mass lesion. No extra-axial collection. No mass effect or midline shift. No hydrocephalus. Basilar cisterns are patent. Vascular: No hyperdense vessel. Skull: No acute fracture or focal lesion. Sinuses/Orbits: Paranasal sinuses and mastoid air cells are clear. Bilateral lens replacement. Otherwise the orbits are unremarkable.  Other: None. CT CERVICAL SPINE FINDINGS Alignment: Normal. Skull base and vertebrae: Multilevel severe degenerative changes of the spine most prominent at the C6-C7 levels. No acute fracture. No aggressive appearing focal osseous lesion or focal pathologic process. Soft tissues and spinal canal: No prevertebral fluid or swelling. No visible canal hematoma. Upper chest:  Unremarkable. Other: None. IMPRESSION: 1. No acute intracranial abnormality. 2. No acute displaced fracture or traumatic listhesis of the cervical spine. Electronically Signed   By: Iven Finn M.D.   On: 05/05/2021 22:48   CT Cervical Spine Wo Contrast  Result Date: 05/05/2021 CLINICAL DATA:  Head trauma, minor (Age >= 65y); Neck trauma (Age >= 65y) EXAM: CT HEAD WITHOUT CONTRAST CT CERVICAL SPINE WITHOUT CONTRAST TECHNIQUE: Multidetector CT imaging of the head and cervical spine was performed following the standard protocol without intravenous contrast. Multiplanar CT image reconstructions of the cervical spine were also generated. RADIATION DOSE REDUCTION: This exam was performed according to the departmental dose-optimization program which includes automated exposure control, adjustment of the mA and/or kV according to patient size and/or use of iterative reconstruction technique. COMPARISON:  CT head 10/23/2010, CT head 07/24/2017 FINDINGS: CT HEAD FINDINGS BRAIN: BRAIN Encephalomalacia of the right temporoparietal and occipital lobes again noted. No evidence of large-territorial acute infarction. No parenchymal hemorrhage. No mass lesion. No extra-axial collection. No mass effect or midline shift. No hydrocephalus. Basilar cisterns are patent. Vascular: No hyperdense vessel. Skull: No acute fracture or focal lesion. Sinuses/Orbits: Paranasal sinuses and mastoid air cells are clear. Bilateral lens replacement. Otherwise the orbits are unremarkable. Other: None. CT CERVICAL SPINE FINDINGS Alignment: Normal. Skull base and vertebrae:  Multilevel severe degenerative changes of the spine most prominent at the C6-C7 levels. No acute fracture. No aggressive appearing focal osseous lesion or focal pathologic process. Soft tissues and spinal canal: No prevertebral fluid or swelling. No visible canal hematoma. Upper chest: Unremarkable. Other: None. IMPRESSION: 1. No acute intracranial abnormality. 2. No acute displaced fracture or traumatic listhesis of the cervical spine. Electronically Signed   By: Iven Finn M.D.   On: 05/05/2021 22:48    Procedures Procedures    Medications Ordered in ED Medications  acetaminophen (TYLENOL) tablet 650 mg (has no administration in time range)    ED Course/ Medical Decision Making/ A&P                           Medical Decision Making Risk OTC drugs.   This patient presents to the ED for concern of head injury, this involves an extensive number of treatment options, and is a complaint that carries with it a high risk of complications and morbidity.  The differential diagnosis includes concussion, subarachnoid hemorrhage, subdural hematoma, skull fracture  Comorbidities that complicate the patient evaluation: Patients presentation is complicated by their history of atrial fibrillation on anticoagulation, history of rheumatoid  Social Determinants of Health: Patients nursing home residence increases the complexity of managing their presentation  Additional history obtained:  Records reviewed previous admission documents    Imaging Studies ordered: I ordered imaging studies including CT scan head and C-spine I independently visualized and interpreted imaging which showed no acute injuries I agree with the radiologist interpretation   Medicines ordered and prescription drug management: I ordered medication including Tylenol for pain  Reevaluation: After the interventions noted above, I reevaluated the patient and found that they have :improved  Complexity of problems  addressed: Patients presentation is most consistent with  acute, uncomplicated illness      Disposition: After consideration of the diagnostic results and the patients response to treatment,  I feel that the patent would benefit from discharge .   CT imaging negative.  Patient has no other acute findings.  She is safe for discharge back to nursing home  Final Clinical Impression(s) / ED Diagnoses Final diagnoses:  Concussion without loss of consciousness, initial encounter    Rx / DC Orders ED Discharge Orders     None         Ripley Fraise, MD 05/05/21 2355

## 2021-05-20 ENCOUNTER — Inpatient Hospital Stay (HOSPITAL_COMMUNITY)
Admission: EM | Admit: 2021-05-20 | Discharge: 2021-05-25 | DRG: 871 | Payer: 59 | Attending: Internal Medicine | Admitting: Internal Medicine

## 2021-05-20 ENCOUNTER — Emergency Department (HOSPITAL_COMMUNITY): Payer: 59

## 2021-05-20 ENCOUNTER — Encounter (HOSPITAL_COMMUNITY): Payer: Self-pay | Admitting: Emergency Medicine

## 2021-05-20 DIAGNOSIS — J9601 Acute respiratory failure with hypoxia: Secondary | ICD-10-CM | POA: Diagnosis present

## 2021-05-20 DIAGNOSIS — I493 Ventricular premature depolarization: Secondary | ICD-10-CM | POA: Diagnosis present

## 2021-05-20 DIAGNOSIS — I4819 Other persistent atrial fibrillation: Secondary | ICD-10-CM | POA: Diagnosis present

## 2021-05-20 DIAGNOSIS — R4182 Altered mental status, unspecified: Secondary | ICD-10-CM

## 2021-05-20 DIAGNOSIS — E11621 Type 2 diabetes mellitus with foot ulcer: Secondary | ICD-10-CM | POA: Diagnosis present

## 2021-05-20 DIAGNOSIS — A408 Other streptococcal sepsis: Secondary | ICD-10-CM | POA: Diagnosis not present

## 2021-05-20 DIAGNOSIS — E876 Hypokalemia: Secondary | ICD-10-CM | POA: Diagnosis present

## 2021-05-20 DIAGNOSIS — Z794 Long term (current) use of insulin: Secondary | ICD-10-CM

## 2021-05-20 DIAGNOSIS — Z823 Family history of stroke: Secondary | ICD-10-CM

## 2021-05-20 DIAGNOSIS — A419 Sepsis, unspecified organism: Secondary | ICD-10-CM

## 2021-05-20 DIAGNOSIS — Z79899 Other long term (current) drug therapy: Secondary | ICD-10-CM

## 2021-05-20 DIAGNOSIS — Z96643 Presence of artificial hip joint, bilateral: Secondary | ICD-10-CM | POA: Diagnosis present

## 2021-05-20 DIAGNOSIS — I878 Other specified disorders of veins: Secondary | ICD-10-CM | POA: Diagnosis present

## 2021-05-20 DIAGNOSIS — G9341 Metabolic encephalopathy: Secondary | ICD-10-CM | POA: Diagnosis present

## 2021-05-20 DIAGNOSIS — L8990 Pressure ulcer of unspecified site, unspecified stage: Secondary | ICD-10-CM | POA: Diagnosis present

## 2021-05-20 DIAGNOSIS — Z20822 Contact with and (suspected) exposure to covid-19: Secondary | ICD-10-CM | POA: Diagnosis present

## 2021-05-20 DIAGNOSIS — E7800 Pure hypercholesterolemia, unspecified: Secondary | ICD-10-CM | POA: Diagnosis present

## 2021-05-20 DIAGNOSIS — I482 Chronic atrial fibrillation, unspecified: Secondary | ICD-10-CM | POA: Diagnosis present

## 2021-05-20 DIAGNOSIS — Z8249 Family history of ischemic heart disease and other diseases of the circulatory system: Secondary | ICD-10-CM

## 2021-05-20 DIAGNOSIS — E785 Hyperlipidemia, unspecified: Secondary | ICD-10-CM | POA: Diagnosis present

## 2021-05-20 DIAGNOSIS — I10 Essential (primary) hypertension: Secondary | ICD-10-CM | POA: Diagnosis present

## 2021-05-20 DIAGNOSIS — M069 Rheumatoid arthritis, unspecified: Secondary | ICD-10-CM | POA: Diagnosis present

## 2021-05-20 DIAGNOSIS — Z7951 Long term (current) use of inhaled steroids: Secondary | ICD-10-CM

## 2021-05-20 DIAGNOSIS — L0291 Cutaneous abscess, unspecified: Secondary | ICD-10-CM

## 2021-05-20 DIAGNOSIS — N179 Acute kidney failure, unspecified: Secondary | ICD-10-CM | POA: Diagnosis present

## 2021-05-20 DIAGNOSIS — Z7952 Long term (current) use of systemic steroids: Secondary | ICD-10-CM

## 2021-05-20 DIAGNOSIS — I130 Hypertensive heart and chronic kidney disease with heart failure and stage 1 through stage 4 chronic kidney disease, or unspecified chronic kidney disease: Secondary | ICD-10-CM | POA: Diagnosis present

## 2021-05-20 DIAGNOSIS — I4891 Unspecified atrial fibrillation: Secondary | ICD-10-CM

## 2021-05-20 DIAGNOSIS — L89892 Pressure ulcer of other site, stage 2: Secondary | ICD-10-CM | POA: Diagnosis present

## 2021-05-20 DIAGNOSIS — E1169 Type 2 diabetes mellitus with other specified complication: Secondary | ICD-10-CM | POA: Diagnosis present

## 2021-05-20 DIAGNOSIS — K219 Gastro-esophageal reflux disease without esophagitis: Secondary | ICD-10-CM | POA: Diagnosis present

## 2021-05-20 DIAGNOSIS — K6812 Psoas muscle abscess: Secondary | ICD-10-CM | POA: Diagnosis present

## 2021-05-20 DIAGNOSIS — Z8673 Personal history of transient ischemic attack (TIA), and cerebral infarction without residual deficits: Secondary | ICD-10-CM

## 2021-05-20 DIAGNOSIS — Z7984 Long term (current) use of oral hypoglycemic drugs: Secondary | ICD-10-CM

## 2021-05-20 DIAGNOSIS — L97529 Non-pressure chronic ulcer of other part of left foot with unspecified severity: Secondary | ICD-10-CM | POA: Diagnosis present

## 2021-05-20 DIAGNOSIS — E118 Type 2 diabetes mellitus with unspecified complications: Secondary | ICD-10-CM | POA: Diagnosis present

## 2021-05-20 DIAGNOSIS — Z888 Allergy status to other drugs, medicaments and biological substances status: Secondary | ICD-10-CM

## 2021-05-20 DIAGNOSIS — I5032 Chronic diastolic (congestive) heart failure: Secondary | ICD-10-CM | POA: Diagnosis present

## 2021-05-20 DIAGNOSIS — E1151 Type 2 diabetes mellitus with diabetic peripheral angiopathy without gangrene: Secondary | ICD-10-CM | POA: Diagnosis present

## 2021-05-20 DIAGNOSIS — Z66 Do not resuscitate: Secondary | ICD-10-CM | POA: Diagnosis present

## 2021-05-20 DIAGNOSIS — R6520 Severe sepsis without septic shock: Secondary | ICD-10-CM | POA: Diagnosis present

## 2021-05-20 LAB — CBC WITH DIFFERENTIAL/PLATELET
Abs Immature Granulocytes: 0.23 10*3/uL — ABNORMAL HIGH (ref 0.00–0.07)
Basophils Absolute: 0.1 10*3/uL (ref 0.0–0.1)
Basophils Relative: 1 %
Eosinophils Absolute: 0 10*3/uL (ref 0.0–0.5)
Eosinophils Relative: 0 %
HCT: 43.1 % (ref 36.0–46.0)
Hemoglobin: 12.7 g/dL (ref 12.0–15.0)
Immature Granulocytes: 1 %
Lymphocytes Relative: 3 %
Lymphs Abs: 0.7 10*3/uL (ref 0.7–4.0)
MCH: 26.5 pg (ref 26.0–34.0)
MCHC: 29.5 g/dL — ABNORMAL LOW (ref 30.0–36.0)
MCV: 90 fL (ref 80.0–100.0)
Monocytes Absolute: 0.6 10*3/uL (ref 0.1–1.0)
Monocytes Relative: 3 %
Neutro Abs: 19.6 10*3/uL — ABNORMAL HIGH (ref 1.7–7.7)
Neutrophils Relative %: 92 %
Platelets: 303 10*3/uL (ref 150–400)
RBC: 4.79 MIL/uL (ref 3.87–5.11)
RDW: 19.6 % — ABNORMAL HIGH (ref 11.5–15.5)
WBC: 21.2 10*3/uL — ABNORMAL HIGH (ref 4.0–10.5)
nRBC: 0 % (ref 0.0–0.2)

## 2021-05-20 LAB — AMMONIA: Ammonia: 12 umol/L (ref 9–35)

## 2021-05-20 LAB — BLOOD GAS, VENOUS
Acid-Base Excess: 1 mmol/L (ref 0.0–2.0)
Bicarbonate: 26 mmol/L (ref 20.0–28.0)
Drawn by: 6381
FIO2: 36 %
O2 Saturation: 70.7 %
Patient temperature: 36.6
pCO2, Ven: 41 mmHg — ABNORMAL LOW (ref 44–60)
pH, Ven: 7.41 (ref 7.25–7.43)
pO2, Ven: 46 mmHg — ABNORMAL HIGH (ref 32–45)

## 2021-05-20 LAB — PROTIME-INR
INR: 1.8 — ABNORMAL HIGH (ref 0.8–1.2)
Prothrombin Time: 21.2 seconds — ABNORMAL HIGH (ref 11.4–15.2)

## 2021-05-20 LAB — LACTIC ACID, PLASMA: Lactic Acid, Venous: 6.4 mmol/L (ref 0.5–1.9)

## 2021-05-20 LAB — COMPREHENSIVE METABOLIC PANEL
ALT: 19 U/L (ref 0–44)
AST: 54 U/L — ABNORMAL HIGH (ref 15–41)
Albumin: 3.4 g/dL — ABNORMAL LOW (ref 3.5–5.0)
Alkaline Phosphatase: 149 U/L — ABNORMAL HIGH (ref 38–126)
Anion gap: 19 — ABNORMAL HIGH (ref 5–15)
BUN: 21 mg/dL (ref 8–23)
CO2: 22 mmol/L (ref 22–32)
Calcium: 8.4 mg/dL — ABNORMAL LOW (ref 8.9–10.3)
Chloride: 99 mmol/L (ref 98–111)
Creatinine, Ser: 1.05 mg/dL — ABNORMAL HIGH (ref 0.44–1.00)
GFR, Estimated: 55 mL/min — ABNORMAL LOW (ref >60)
Glucose, Bld: 75 mg/dL (ref 70–99)
Potassium: 3.2 mmol/L — ABNORMAL LOW (ref 3.5–5.1)
Sodium: 140 mmol/L (ref 135–145)
Total Bilirubin: 1.4 mg/dL — ABNORMAL HIGH (ref 0.3–1.2)
Total Protein: 7.1 g/dL (ref 6.5–8.1)

## 2021-05-20 LAB — APTT: aPTT: 35 seconds (ref 24–36)

## 2021-05-20 LAB — CBG MONITORING, ED: Glucose-Capillary: 61 mg/dL — ABNORMAL LOW (ref 70–99)

## 2021-05-20 LAB — BRAIN NATRIURETIC PEPTIDE: B Natriuretic Peptide: 760 pg/mL — ABNORMAL HIGH (ref 0.0–100.0)

## 2021-05-20 LAB — TROPONIN I (HIGH SENSITIVITY): Troponin I (High Sensitivity): 21 ng/L — ABNORMAL HIGH (ref <18)

## 2021-05-20 LAB — MAGNESIUM: Magnesium: 1.2 mg/dL — ABNORMAL LOW (ref 1.7–2.4)

## 2021-05-20 MED ORDER — LACTATED RINGERS IV BOLUS (SEPSIS)
2000.0000 mL | Freq: Once | INTRAVENOUS | Status: AC
  Administered 2021-05-21: 2000 mL via INTRAVENOUS

## 2021-05-20 MED ORDER — SODIUM CHLORIDE 0.9 % IV SOLN
2.0000 g | Freq: Once | INTRAVENOUS | Status: AC
  Administered 2021-05-20: 2 g via INTRAVENOUS
  Filled 2021-05-20: qty 2

## 2021-05-20 MED ORDER — POTASSIUM CHLORIDE 10 MEQ/100ML IV SOLN
10.0000 meq | INTRAVENOUS | Status: AC
  Administered 2021-05-20: 10 meq via INTRAVENOUS
  Filled 2021-05-20: qty 100

## 2021-05-20 MED ORDER — VANCOMYCIN HCL IN DEXTROSE 1-5 GM/200ML-% IV SOLN
1000.0000 mg | Freq: Once | INTRAVENOUS | Status: AC
  Administered 2021-05-21: 1000 mg via INTRAVENOUS
  Filled 2021-05-20: qty 200

## 2021-05-20 MED ORDER — IOHEXOL 350 MG/ML SOLN
100.0000 mL | Freq: Once | INTRAVENOUS | Status: AC | PRN
  Administered 2021-05-21: 100 mL via INTRAVENOUS

## 2021-05-20 MED ORDER — DILTIAZEM HCL-DEXTROSE 125-5 MG/125ML-% IV SOLN (PREMIX)
5.0000 mg/h | INTRAVENOUS | Status: DC
  Administered 2021-05-20: 5 mg/h via INTRAVENOUS
  Administered 2021-05-22: 15 mg/h via INTRAVENOUS
  Filled 2021-05-20 (×3): qty 125

## 2021-05-20 MED ORDER — METRONIDAZOLE 500 MG/100ML IV SOLN
500.0000 mg | Freq: Once | INTRAVENOUS | Status: AC
  Administered 2021-05-21: 500 mg via INTRAVENOUS
  Filled 2021-05-20: qty 100

## 2021-05-20 MED ORDER — LACTATED RINGERS IV BOLUS
500.0000 mL | Freq: Once | INTRAVENOUS | Status: AC
Start: 2021-05-20 — End: 2021-05-21
  Administered 2021-05-20: 500 mL via INTRAVENOUS

## 2021-05-20 MED ORDER — DILTIAZEM LOAD VIA INFUSION
10.0000 mg | Freq: Once | INTRAVENOUS | Status: AC
  Administered 2021-05-20: 10 mg via INTRAVENOUS
  Filled 2021-05-20: qty 10

## 2021-05-20 MED ORDER — DEXTROSE 50 % IV SOLN
25.0000 mL | Freq: Once | INTRAVENOUS | Status: AC
  Administered 2021-05-20: 25 mL via INTRAVENOUS
  Filled 2021-05-20: qty 50

## 2021-05-20 MED ORDER — MAGNESIUM SULFATE 2 GM/50ML IV SOLN
2.0000 g | Freq: Once | INTRAVENOUS | Status: AC
Start: 2021-05-20 — End: 2021-05-21
  Administered 2021-05-20: 2 g via INTRAVENOUS
  Filled 2021-05-20: qty 50

## 2021-05-20 MED ORDER — LACTATED RINGERS IV SOLN: INTRAVENOUS | Status: DC

## 2021-05-20 NOTE — ED Triage Notes (Signed)
Pt arrives EMS from [facility]. Per facility they called due to pt having nausea, vomiting, and some AMS. Pt noted to have elevated HR by EMS.   Hx of afib.

## 2021-05-20 NOTE — ED Provider Notes (Signed)
[name] Provider Note   CSN: [id number] Arrival date & time: 05/20/21  2144     History  Chief Complaint  Patient presents with   Altered Mental Status    [name] is a 76 y.o. female.  Level 5 caveat secondary to altered mental status.  Per EMS patient brought in from facility for nausea vomiting and altered mental status that started sometime today.  Patient unable to give any history.  She does have a history of atrial fibrillation on blood thinners, diabetes among many other.  She was saturating 85% on room air and placed on oxygen.  Does not normally require oxygen.  The history is provided by the EMS personnel and the patient.  Altered Mental Status Presenting symptoms: lethargy and partial responsiveness   Most recent episode:  Today Episode history:  Continuous Timing:  Constant Progression:  Unchanged Chronicity:  New Context: nursing home resident   Associated symptoms: vomiting       Home Medications Prior to Admission medications   Medication Sig Start Date End Date Taking? Authorizing Provider  acetaminophen (TYLENOL) 650 MG CR tablet Take 1,300 mg by mouth in the morning and at bedtime.    [provider]  albuterol (VENTOLIN HFA) 108 (90 Base) MCG/ACT inhaler Inhale 2 puffs into the lungs every 4 (four) hours as needed for wheezing or shortness of breath.    [provider]  Amino Acids-Protein Hydrolys (FEEDING SUPPLEMENT, PRO-STAT 64,) LIQD Take 30 mLs by mouth in the morning.    [provider]  Ascorbic Acid (VITAMIN C PO) Take 500 mg by mouth in the morning and at bedtime.    [provider]  atorvastatin (LIPITOR) 20 MG tablet Take 20 mg by mouth daily. 01/27/20   [provider]  azelastine (OPTIVAR) 0.05 % ophthalmic solution Place 1 drop into both eyes 2 (two) times daily.    [provider]  buPROPion (WELLBUTRIN SR) 150 MG 12 hr tablet Take 100 mg by mouth 2 (two)  times daily.    [provider]  carboxymethylcellulose (ARTIFICIAL TEARS) 1 % ophthalmic solution Place 2 drops into both eyes in the morning.    [provider]  Cholecalciferol (VITAMIN D3) 1000 units CAPS Take 2,000 Units by mouth daily.    [provider]  Cream Base (REJUVACARE PLUS) CREA Apply 1 application topically in the morning and at bedtime. Apply to face for dry skin patches    [provider]  cycloSPORINE (RESTASIS) 0.05 % ophthalmic emulsion Place 1 drop into both eyes 2 (two) times daily.    [provider]  diclofenac Sodium (VOLTAREN) 1 % GEL Apply 2 g topically at bedtime as needed for pain. 03/15/20   [provider]  diltiazem (CARDIZEM CD) 240 MG 24 hr capsule Take 240 mg by mouth daily. 01/27/20   [provider]  Eyelid Cleansers (OCUSOFT LID SCRUB ORIGINAL) PADS Apply 1 application topically in the morning and at bedtime. Apply to both eyelids for eye problem    [provider]  fluticasone-salmeterol (ADVAIR HFA) 115-21 MCG/ACT inhaler Inhale 2 puffs into the lungs 2 times daily at 12 noon and 4 pm.    [provider]  furosemide (LASIX) 40 MG tablet Take 40 mg by mouth daily.     [provider]  gabapentin (NEURONTIN) 100 MG capsule Take 3 capsules (300 mg total) by mouth 3 (three) times daily. 10/10/17   Kathie Dike, MD  HYDROcodone-acetaminophen (NORCO/VICODIN)  5-325 MG tablet Take 1 tablet by mouth every 6 (six) hours as needed. 04/03/20   Danford, Suann Larry, MD  insulin glargine (LANTUS) 100 UNIT/ML injection Inject 30 Units into the skin in the morning. Hold if BS is less than 150    [provider]  insulin lispro (HUMALOG) 100 UNIT/ML injection Inject 6 Units into the skin in the morning and at bedtime. Hold is BS <100    [provider]  lidocaine (LIDODERM) 5 % Place 1 patch onto the skin daily. Remove & Discard patch within 12 hours or as directed by  MD    [provider]  metFORMIN (GLUCOPHAGE-XR) 500 MG 24 hr tablet Take 1,000 mg by mouth 2 (two) times daily. 07/31/17   [provider]  metoprolol tartrate (LOPRESSOR) 100 MG tablet Take 1 tablet (100 mg total) by mouth 3 (three) times daily. Patient taking differently: Take 100 mg by mouth 2 (two) times daily. 03/06/18   Barton Dubois, MD  Multiple Vitamins-Minerals (CERTAGEN PO) Take 1 tablet by mouth in the morning. For wound healing    [provider]  Multiple Vitamins-Minerals (PRESERVISION AREDS 2) CAPS Take 1 capsule by mouth daily.     [provider]  nitroGLYCERIN (NITROSTAT) 0.4 MG SL tablet Place 1 tablet (0.4 mg total) under the tongue every 5 (five) minutes as needed for chest pain (x 3 doses). 11/28/16   Lelon Perla, MD  omeprazole (PRILOSEC) 20 MG capsule Take 1 capsule by mouth daily. 02/12/18   [provider]  oxyCODONE (OXY IR/ROXICODONE) 5 MG immediate release tablet Take 1.5 tablets (7.5 mg total) by mouth every 6 (six) hours as needed (Pain). 04/03/20   Danford, Suann Larry, MD  predniSONE (DELTASONE) 5 MG tablet Take 5 mg by mouth daily.    [provider]  Rivaroxaban (XARELTO) 15 MG TABS tablet Take 1 tablet (15 mg total) by mouth daily. Patient taking differently: Take 10 mg by mouth daily. 07/28/17   Kathrene Alu, MD  senna (SENOKOT) 8.6 MG TABS tablet Take 1 tablet by mouth daily.    [provider]  spironolactone (ALDACTONE) 25 MG tablet Take 12.5 mg by mouth in the morning. 01/27/20   [provider]  VASCEPA 1 g CAPS Take 1 capsule by mouth every morning. 02/25/18   [provider]  VOLTAREN 1 % GEL Apply 2 g topically at bedtime as needed for pain. Knees, calf 04/27/17   [provider]  zinc sulfate 220 (50 Zn) MG capsule Take 220 mg by mouth daily.    [provider]  ferrous gluconate (FERGON) 324 MG tablet Take 1 tablet (324 mg total) by mouth 2  (two) times daily with a meal. Patient not taking: Reported on 03/30/2020 05/17/16 04/01/20  Debbe Odea, MD  potassium chloride SA (K-DUR,KLOR-CON) 20 MEQ tablet Take 1 tablet (20 mEq total) by mouth 2 (two) times daily. Patient not taking: Reported on 03/30/2020 10/10/17 04/01/20  Kathie Dike, MD      Allergies    Promethazine    Review of Systems   Review of Systems  Unable to perform ROS: Mental status change  Gastrointestinal:  Positive for vomiting.   Physical Exam Updated Vital Signs Ht 5\' 7"  (1.702 m)    Wt 82.2 kg    BMI 28.38 kg/m  Physical Exam Vitals and nursing note reviewed.  Constitutional:      Appearance: She is well-developed. She is obese. She is ill-appearing.  HENT:  Head: Normocephalic and atraumatic.  Eyes:     Conjunctiva/sclera: Conjunctivae normal.  Cardiovascular:     Rate and Rhythm: Tachycardia present. Rhythm irregular.     Heart sounds: No murmur heard. Pulmonary:     Effort: Pulmonary effort is normal. No respiratory distress.     Breath sounds: Normal breath sounds.  Abdominal:     Palpations: Abdomen is soft.     Tenderness: There is no abdominal tenderness. There is no guarding or rebound.  Musculoskeletal:        General: No swelling.     Cervical back: Neck supple.     Right lower leg: Edema present.     Left lower leg: Edema present.     Comments: Multiple dressings on her lower extremities over wounds.  Skin:    General: Skin is warm and dry.     Capillary Refill: Capillary refill takes less than 2 seconds.     Coloration: Skin is pale.  Neurological:     Comments: She will open her eyes to voice.  She denied pain.  Seems to be moving all extremities without any focal deficits.    ED Results / Procedures / Treatments   Labs (all labs ordered are listed, but only abnormal results are displayed) Labs Reviewed  LACTIC ACID, PLASMA - Abnormal; Notable for the following components:      Result Value   Lactic Acid, Venous  6.4 (*)    All other components within normal limits  LACTIC ACID, PLASMA - Abnormal; Notable for the following components:   Lactic Acid, Venous 6.7 (*)    All other components within normal limits  COMPREHENSIVE METABOLIC PANEL - Abnormal; Notable for the following components:   Potassium 3.2 (*)    Creatinine, Ser 1.05 (*)    Calcium 8.4 (*)    Albumin 3.4 (*)    AST 54 (*)    Alkaline Phosphatase 149 (*)    Total Bilirubin 1.4 (*)    GFR, Estimated 55 (*)    Anion gap 19 (*)    All other components within normal limits  CBC WITH DIFFERENTIAL/PLATELET - Abnormal; Notable for the following components:   WBC 21.2 (*)    MCHC 29.5 (*)    RDW 19.6 (*)    Neutro Abs 19.6 (*)    Abs Immature Granulocytes 0.23 (*)    All other components within normal limits  PROTIME-INR - Abnormal; Notable for the following components:   Prothrombin Time 21.2 (*)    INR 1.8 (*)    All other components within normal limits  URINALYSIS, ROUTINE W REFLEX MICROSCOPIC - Abnormal; Notable for the following components:   Specific Gravity, Urine >1.046 (*)    Ketones, ur 5 (*)    Protein, ur 100 (*)    Leukocytes,Ua TRACE (*)    All other components within normal limits  BRAIN NATRIURETIC PEPTIDE - Abnormal; Notable for the following components:   B Natriuretic Peptide 760.0 (*)    All other components within normal limits  BLOOD GAS, VENOUS - Abnormal; Notable for the following components:   pCO2, Ven 41 (*)    pO2, Ven 46 (*)    All other components within normal limits  MAGNESIUM - Abnormal; Notable for the following components:   Magnesium 1.2 (*)    All other components within normal limits  LACTIC ACID, PLASMA - Abnormal; Notable for the following components:   Lactic Acid, Venous 5.4 (*)    All other components within normal  limits  LACTIC ACID, PLASMA - Abnormal; Notable for the following components:   Lactic Acid, Venous 4.2 (*)    All other components within normal limits  HEMOGLOBIN  A1C - Abnormal; Notable for the following components:   Hgb A1c MFr Bld 5.7 (*)    All other components within normal limits  PROTIME-INR - Abnormal; Notable for the following components:   Prothrombin Time 21.2 (*)    INR 1.8 (*)    All other components within normal limits  CORTISOL-AM, BLOOD - Abnormal; Notable for the following components:   Cortisol - AM >100.0 (*)    All other components within normal limits  COMPREHENSIVE METABOLIC PANEL - Abnormal; Notable for the following components:   Creatinine, Ser 1.02 (*)    Calcium 7.7 (*)    Total Protein 5.4 (*)    Albumin 2.5 (*)    AST 285 (*)    ALT 78 (*)    GFR, Estimated 57 (*)    All other components within normal limits  MAGNESIUM - Abnormal; Notable for the following components:   Magnesium 1.3 (*)    All other components within normal limits  CBC WITH DIFFERENTIAL/PLATELET - Abnormal; Notable for the following components:   WBC 11.6 (*)    Hemoglobin 10.9 (*)    HCT 35.8 (*)    RDW 19.3 (*)    Neutro Abs 10.1 (*)    Monocytes Absolute 0.0 (*)    All other components within normal limits  CBG MONITORING, ED - Abnormal; Notable for the following components:   Glucose-Capillary 61 (*)    All other components within normal limits  TROPONIN I (HIGH SENSITIVITY) - Abnormal; Notable for the following components:   Troponin I (High Sensitivity) 21 (*)    All other components within normal limits  TROPONIN I (HIGH SENSITIVITY) - Abnormal; Notable for the following components:   Troponin I (High Sensitivity) 19 (*)    All other components within normal limits  CULTURE, BLOOD (ROUTINE X 2)  CULTURE, BLOOD (ROUTINE X 2)  RESP PANEL BY RT-PCR (FLU A&B, COVID) [id number]  URINE CULTURE  APTT  AMMONIA  PROCALCITONIN  CBG MONITORING, ED  CBG MONITORING, ED    EKG EKG Interpretation  Date/Time:  Thursday May 20 2021 21:53:03 EST Ventricular Rate:  167 PR Interval:    QRS Duration: 74 QT Interval:  232 QTC  Calculation: 387 R Axis:   74 Text Interpretation: Atrial fibrillation with rapid V-rate Low voltage, extremity leads Repolarization abnormality, prob rate related No significant change since prior 12/21 Confirmed by Aletta Edouard [phone]) on 05/20/2021 10:13:34 PM  Radiology CT Head Wo Contrast  Result Date: 05/21/2021 CLINICAL DATA:  Altered mental status, nausea/vomiting EXAM: CT HEAD WITHOUT CONTRAST TECHNIQUE: Contiguous axial images were obtained from the base of the skull through the vertex without intravenous contrast. RADIATION DOSE REDUCTION: This exam was performed according to the departmental dose-optimization program which includes automated exposure control, adjustment of the mA and/or kV according to patient size and/or use of iterative reconstruction technique. COMPARISON:  05/05/2021 FINDINGS: Brain: No evidence of acute infarction, hemorrhage, hydrocephalus, extra-axial collection or mass lesion/mass effect. Subcortical white matter and periventricular small vessel ischemic changes. Encephalomalacic changes related to prior right temporo-occipital infarcts. Vascular: Intracranial atherosclerosis. Skull: Normal. Negative for fracture or focal lesion. Sinuses/Orbits: The visualized paranasal sinuses are essentially clear. The mastoid air cells are unopacified. Other: None. IMPRESSION: No evidence of acute intracranial abnormality. Small vessel ischemic changes. Old right sided infarcts. Electronically  Signed   By: Julian Hy M.D.   On: 05/21/2021 01:06   CT Angio Chest PE W/Cm &/Or Wo Cm  Result Date: 05/21/2021 CLINICAL DATA:  Tachycardia, history of AFib. Sepsis. Nausea/vomiting. Abdominal pain. EXAM: CT ANGIOGRAPHY CHEST CT ABDOMEN AND PELVIS WITH CONTRAST TECHNIQUE: Multidetector CT imaging of the chest was performed using the standard protocol during bolus administration of intravenous contrast. Multiplanar CT image reconstructions and MIPs were obtained to evaluate the  vascular anatomy. Multidetector CT imaging of the abdomen and pelvis was performed using the standard protocol during bolus administration of intravenous contrast. RADIATION DOSE REDUCTION: This exam was performed according to the departmental dose-optimization program which includes automated exposure control, adjustment of the mA and/or kV according to patient size and/or use of iterative reconstruction technique. CONTRAST:  161mL OMNIPAQUE IOHEXOL 350 MG/ML SOLN COMPARISON:  CTA chest dated 10/08/2014. FINDINGS: CTA CHEST FINDINGS Cardiovascular: Satisfactory opacification of the bilateral pulmonary arteries to the lobar level. No evidence of pulmonary embolism. Study is not tailored for evaluation of the thoracic aorta. No evidence of thoracic aortic aneurysm. Atherosclerotic calcifications of the aortic arch. Cardiomegaly.  No pericardial effusion. Coronary atherosclerosis of the LAD and right coronary artery. Mediastinum/Nodes: No suspicious mediastinal lymphadenopathy. Visualized thyroid is unremarkable. Lungs/Pleura: Mild interstitial edema with small right and trace left pleural effusions. Associated bilateral lower lobe opacities, likely atelectasis. Evaluation of the lung parenchyma is constrained by respiratory motion. Within that constraint, there are no suspicious pulmonary nodules. No pneumothorax. Musculoskeletal: Mild degenerative changes of the mid/lower thoracic spine. Review of the MIP images confirms the above findings. CT ABDOMEN and PELVIS FINDINGS Motion degraded images. Hepatobiliary: Liver is within normal limits. Gallbladder is unremarkable. No intrahepatic or extrahepatic ductal dilatation. Pancreas: Within normal limits. Spleen: Within normal limits. Adrenals/Urinary Tract: Adrenal glands are within normal limits. 3.0 cm hyperdense/hemorrhagic right upper pole renal cyst (series 13/image 37). Additional 13 mm posterior right lower pole renal cyst (series 13/image 42), simple. Left kidney  is within normal limits. No hydronephrosis. Bladder is within normal limits. Stomach/Bowel: Stomach is within normal limits. No evidence of bowel obstruction. Normal appendix (series 13/image 99). Left colon is decompressed scattered mild colonic diverticulosis. Vascular/Lymphatic: No evidence of abdominal aortic aneurysm. Atherosclerotic calcifications of the abdominal aorta and branch vessels. Small retroperitoneal lymph nodes which do not meet pathologic CT size criteria. Reproductive: Uterine fibroid. Left ovary is within normal limits.  No right adnexal mass. Other: No abdominopelvic ascites. Musculoskeletal: 2.7 x 3.4 x 4.2 cm fluid collection in the left iliopsoas muscle (series 13/image 78), partially obscured by streak artifact. Degenerative changes of the lumbar spine. Bilateral hip arthroplasties, without evidence of complication. Review of the MIP images confirms the above findings. IMPRESSION: No evidence of pulmonary embolism. Cardiomegaly with mild interstitial edema and small bilateral pleural effusions, right greater than left. 4.2 cm intramuscular fluid collection the left iliopsoas muscle, partially obscured by streak artifact. Uterine fibroids. Electronically Signed   By: Julian Hy M.D.   On: 05/21/2021 01:14   CT Abdomen Pelvis W Contrast  Result Date: 05/21/2021 CLINICAL DATA:  Tachycardia, history of AFib. Sepsis. Nausea/vomiting. Abdominal pain. EXAM: CT ANGIOGRAPHY CHEST CT ABDOMEN AND PELVIS WITH CONTRAST TECHNIQUE: Multidetector CT imaging of the chest was performed using the standard protocol during bolus administration of intravenous contrast. Multiplanar CT image reconstructions and MIPs were obtained to evaluate the vascular anatomy. Multidetector CT imaging of the abdomen and pelvis was performed using the standard protocol during bolus administration of intravenous contrast. RADIATION DOSE  REDUCTION: This exam was performed according to the departmental dose-optimization  program which includes automated exposure control, adjustment of the mA and/or kV according to patient size and/or use of iterative reconstruction technique. CONTRAST:  193mL OMNIPAQUE IOHEXOL 350 MG/ML SOLN COMPARISON:  CTA chest dated 10/08/2014. FINDINGS: CTA CHEST FINDINGS Cardiovascular: Satisfactory opacification of the bilateral pulmonary arteries to the lobar level. No evidence of pulmonary embolism. Study is not tailored for evaluation of the thoracic aorta. No evidence of thoracic aortic aneurysm. Atherosclerotic calcifications of the aortic arch. Cardiomegaly.  No pericardial effusion. Coronary atherosclerosis of the LAD and right coronary artery. Mediastinum/Nodes: No suspicious mediastinal lymphadenopathy. Visualized thyroid is unremarkable. Lungs/Pleura: Mild interstitial edema with small right and trace left pleural effusions. Associated bilateral lower lobe opacities, likely atelectasis. Evaluation of the lung parenchyma is constrained by respiratory motion. Within that constraint, there are no suspicious pulmonary nodules. No pneumothorax. Musculoskeletal: Mild degenerative changes of the mid/lower thoracic spine. Review of the MIP images confirms the above findings. CT ABDOMEN and PELVIS FINDINGS Motion degraded images. Hepatobiliary: Liver is within normal limits. Gallbladder is unremarkable. No intrahepatic or extrahepatic ductal dilatation. Pancreas: Within normal limits. Spleen: Within normal limits. Adrenals/Urinary Tract: Adrenal glands are within normal limits. 3.0 cm hyperdense/hemorrhagic right upper pole renal cyst (series 13/image 37). Additional 13 mm posterior right lower pole renal cyst (series 13/image 42), simple. Left kidney is within normal limits. No hydronephrosis. Bladder is within normal limits. Stomach/Bowel: Stomach is within normal limits. No evidence of bowel obstruction. Normal appendix (series 13/image 99). Left colon is decompressed scattered mild colonic  diverticulosis. Vascular/Lymphatic: No evidence of abdominal aortic aneurysm. Atherosclerotic calcifications of the abdominal aorta and branch vessels. Small retroperitoneal lymph nodes which do not meet pathologic CT size criteria. Reproductive: Uterine fibroid. Left ovary is within normal limits.  No right adnexal mass. Other: No abdominopelvic ascites. Musculoskeletal: 2.7 x 3.4 x 4.2 cm fluid collection in the left iliopsoas muscle (series 13/image 78), partially obscured by streak artifact. Degenerative changes of the lumbar spine. Bilateral hip arthroplasties, without evidence of complication. Review of the MIP images confirms the above findings. IMPRESSION: No evidence of pulmonary embolism. Cardiomegaly with mild interstitial edema and small bilateral pleural effusions, right greater than left. 4.2 cm intramuscular fluid collection the left iliopsoas muscle, partially obscured by streak artifact. Uterine fibroids. Electronically Signed   By: Julian Hy M.D.   On: 05/21/2021 01:14   Portable chest 1 View  Result Date: 05/21/2021 CLINICAL DATA:  76 year old female with altered mental status. Tachycardia and confusion. Atrial fibrillation with RVR. EXAM: PORTABLE CHEST 1 VIEW COMPARISON:  CTA chest 0046 hours today. FINDINGS: Portable AP upright view at 0537 hours. Right greater than left small pleural effusions better demonstrated by CTA. Pulmonary vascular congestion appears mildly improved from portable chest yesterday. No pneumothorax. Stable cardiac size and mediastinal contours. No consolidation. Visualized tracheal air column is within normal limits. No acute osseous abnormality identified. Negative visible bowel gas. IMPRESSION: Slightly improved pulmonary edema since yesterday. Small right greater than left pleural effusions better demonstrated by CTA earlier today. Electronically Signed   By: Genevie Ann M.D.   On: 05/21/2021 05:54   DG Chest Port 1 View  Result Date: 05/20/2021 CLINICAL  DATA:  Question sepsis. Nausea, vomiting and altered mental status. EXAM: PORTABLE CHEST 1 VIEW COMPARISON:  04/03/2020.  03/31/2020. FINDINGS: The heart is enlarged. There are prominent interstitial markings, probably with mild interstitial edema. No infiltrate, collapse or visible effusion. IMPRESSION: Interstitial edema pattern. No focal  infiltrate, collapse or visible effusion. Electronically Signed   By: [name] Chimes M.D.   On: 05/20/2021 22:43    Procedures .Critical Care Performed by: Hayden Rasmussen, MD Authorized by: Hayden Rasmussen, MD   Critical care provider statement:    Critical care time (minutes):  45   Critical care time was exclusive of:  Separately billable procedures and treating other patients   Critical care was necessary to treat or prevent imminent or life-threatening deterioration of the following conditions:  Sepsis and cardiac failure   Critical care was time spent personally by me on the following activities:  Development of treatment plan with patient or surrogate, discussions with consultants, evaluation of patient's response to treatment, examination of patient, obtaining history from patient or surrogate, ordering and performing treatments and interventions, ordering and review of laboratory studies, ordering and review of radiographic studies, pulse oximetry, re-evaluation of patient's condition and review of old charts   I assumed direction of critical care for this patient from another provider in my specialty: no      Medications Ordered in ED Medications  diltiazem (CARDIZEM) 1 mg/mL load via infusion 10 mg (10 mg Intravenous Bolus from Bag 05/20/21 2216)    And  diltiazem (CARDIZEM) 125 mg in dextrose 5% 125 mL (1 mg/mL) infusion (5 mg/hr Intravenous Infusion Verify 05/21/21 0557)  potassium chloride 10 mEq in 100 mL IVPB (10 mEq Intravenous Not Given 05/21/21 0205)  furosemide (LASIX) injection 20 mg (0 mg Intravenous Hold 05/21/21 0202)  atorvastatin  (LIPITOR) tablet 20 mg (has no administration in time range)  diltiazem (CARDIZEM CD) 24 hr capsule 240 mg (has no administration in time range)  spironolactone (ALDACTONE) tablet 12.5 mg (has no administration in time range)  buPROPion ER (WELLBUTRIN SR) 12 hr tablet 100 mg (has no administration in time range)  pantoprazole (PROTONIX) EC tablet 40 mg (has no administration in time range)  senna (SENOKOT) tablet 8.6 mg (has no administration in time range)  rivaroxaban (XARELTO) tablet 10 mg (has no administration in time range)  gabapentin (NEURONTIN) capsule 300 mg (has no administration in time range)  mometasone-formoterol (DULERA) 200-5 MCG/ACT inhaler 2 puff (2 puffs Inhalation Given 05/21/21 0823)  insulin detemir (LEVEMIR) injection 20 Units (has no administration in time range)  insulin aspart (novoLOG) injection 0-15 Units (0 Units Subcutaneous Not Given 05/21/21 0826)  insulin aspart (novoLOG) injection 0-5 Units (has no administration in time range)  metroNIDAZOLE (FLAGYL) IVPB 500 mg (has no administration in time range)  hydrocortisone sodium succinate (SOLU-CORTEF) 100 MG injection 100 mg (100 mg Intravenous Given 05/21/21 0336)  acetaminophen (TYLENOL) tablet 650 mg ( Oral See Alternative 05/21/21 0335)    Or  acetaminophen (TYLENOL) suppository 650 mg (650 mg Rectal Given 05/21/21 0335)  oxyCODONE (Oxy IR/ROXICODONE) immediate release tablet 5 mg (has no administration in time range)  ondansetron (ZOFRAN) tablet 4 mg (has no administration in time range)    Or  ondansetron (ZOFRAN) injection 4 mg (has no administration in time range)  multivitamin with minerals tablet 1 tablet (has no administration in time range)  ceFEPIme (MAXIPIME) 2 g in sodium chloride 0.9 % 100 mL IVPB (has no administration in time range)  vancomycin (VANCOREADY) IVPB 1250 mg/250 mL (has no administration in time range)  metoprolol tartrate (LOPRESSOR) tablet 100 mg (has no administration in time range)   albuterol (PROVENTIL) (2.5 MG/3ML) 0.083% nebulizer solution 2.5 mg (has no administration in time range)  (feeding supplement) PROSource Plus liquid 30 mL (  has no administration in time range)  omega-3 acid ethyl esters (LOVAZA) capsule 1 g (has no administration in time range)  lactated ringers bolus 500 mL (0 mLs Intravenous Stopped 05/21/21 0014)  dextrose 50 % solution 25 mL (25 mLs Intravenous Given 05/20/21 2312)  magnesium sulfate IVPB 2 g 50 mL (0 g Intravenous Stopped 05/21/21 0014)  lactated ringers bolus 2,000 mL (0 mLs Intravenous Stopped 05/21/21 0132)  ceFEPIme (MAXIPIME) 2 g in sodium chloride 0.9 % 100 mL IVPB (0 g Intravenous Stopped 05/21/21 0015)  metroNIDAZOLE (FLAGYL) IVPB 500 mg (0 mg Intravenous Stopped 05/21/21 0041)  vancomycin (VANCOCIN) IVPB 1000 mg/200 mL premix (0 mg Intravenous Stopped 05/21/21 0159)  iohexol (OMNIPAQUE) 350 MG/ML injection 100 mL (100 mLs Intravenous Contrast Given 05/21/21 0032)  ketorolac (TORADOL) 15 MG/ML injection 15 mg (15 mg Intravenous Given 05/21/21 0437)    ED Course/ Medical Decision Making/ A&P Clinical Course as of 05/21/21 0949  Thu May 20, 2021  2215 Chest x-ray interpreted by me as no clear signs of pneumonia.  Awaiting radiology reading. [MB]    Clinical Course User Index [MB] Hayden Rasmussen, MD                           Medical Decision Making Amount and/or Complexity of Data Reviewed Labs: ordered. Radiology: ordered. ECG/medicine tests: ordered.  Risk Prescription drug management. Decision regarding hospitalization.  This patient complains of altered mental status nausea vomiting elevated heart rate; this involves an extensive number of treatment Options and is a complaint that carries with it a high risk of complications and morbidity. The differential includes rapid A-fib, sepsis, Sirs, stroke, bleed, infection, dehydration, metabolic derangement  I ordered, reviewed and interpreted labs, which included CBC with  elevated white count stable hemoglobin, chemistries with mildly low potassium nonspecific elevated LFTs, lactate critically elevated, troponins mildly elevated need to be trended, BNP elevated I ordered medication IV Cardizem for rate control, IV fluids and IV antibiotics for sepsis and reviewed PMP when indicated. I ordered imaging studies which included chest x-ray, CT head chest abdomen and pelvis and I independently    visualized and interpreted imaging which showed some pulmonary edema.  CT imaging pending at time of signout Additional history obtained from EMS Previous records obtained and reviewed in epic including discharge summary from December where patient was admitted for sepsis and altered mental status .  Cardiac monitoring reviewed, patient in atrial fibrillation with rapid ventricular response Social determinants considered, nursing home resident with multiple comorbidities Critical Interventions: Initiation of IV antibiotics and fluids, rate control In a critically ill patient  After the interventions stated above, I reevaluated the patient and found patient's heart rate to be improving.  Her care is signed out to Dr. Stark Jock to follow-up on results of imaging. Admission and further testing considered, patient will need admission to the hospital.  [name] was evaluated in [name] on 05/20/2021 for the symptoms described in the history of present illness. She was evaluated in the context of the global COVID-19 pandemic, which necessitated consideration that the patient might be at risk for infection with the SARS-CoV-2 virus that causes COVID-19. Institutional protocols and algorithms that pertain to the evaluation of patients at risk for COVID-19 are in a state of rapid change based on information released by regulatory bodies including the CDC and federal and state organizations. These policies and algorithms were followed during the patient's care in the  ED.  Final Clinical Impression(s) / ED Diagnoses Final diagnoses:  Sepsis, due to unspecified organism, unspecified whether acute organ dysfunction present ([place])  Altered mental status, unspecified altered mental status type  Atrial fibrillation with rapid ventricular response ([place])  Sepsis [facility])    Rx / DC Orders ED Discharge Orders     None         Hayden Rasmussen, MD 05/21/21 [phone]

## 2021-05-20 NOTE — ED Notes (Addendum)
Date and time results received: 05/20/21 2303 (use smartphrase ".now" to insert current time)  Test: lactic acid Critical Value: 6.4  Name of Provider Notified: Carin Hock, MD  Orders Received? Or Actions Taken?:  n/a

## 2021-05-20 NOTE — ED Notes (Signed)
Having difficulties obtaining a 2nd IV site. EDP made aware. 3rd RN is attempting

## 2021-05-20 NOTE — ED Notes (Signed)
X-ray at bedside

## 2021-05-21 ENCOUNTER — Inpatient Hospital Stay (HOSPITAL_COMMUNITY): Payer: 59

## 2021-05-21 DIAGNOSIS — Z888 Allergy status to other drugs, medicaments and biological substances status: Secondary | ICD-10-CM | POA: Diagnosis not present

## 2021-05-21 DIAGNOSIS — Z7984 Long term (current) use of oral hypoglycemic drugs: Secondary | ICD-10-CM | POA: Diagnosis not present

## 2021-05-21 DIAGNOSIS — E785 Hyperlipidemia, unspecified: Secondary | ICD-10-CM | POA: Diagnosis present

## 2021-05-21 DIAGNOSIS — N179 Acute kidney failure, unspecified: Secondary | ICD-10-CM

## 2021-05-21 DIAGNOSIS — A408 Other streptococcal sepsis: Secondary | ICD-10-CM | POA: Diagnosis present

## 2021-05-21 DIAGNOSIS — I4891 Unspecified atrial fibrillation: Secondary | ICD-10-CM | POA: Diagnosis not present

## 2021-05-21 DIAGNOSIS — A419 Sepsis, unspecified organism: Secondary | ICD-10-CM

## 2021-05-21 DIAGNOSIS — E876 Hypokalemia: Secondary | ICD-10-CM | POA: Insufficient documentation

## 2021-05-21 DIAGNOSIS — I130 Hypertensive heart and chronic kidney disease with heart failure and stage 1 through stage 4 chronic kidney disease, or unspecified chronic kidney disease: Secondary | ICD-10-CM | POA: Diagnosis present

## 2021-05-21 DIAGNOSIS — G9341 Metabolic encephalopathy: Secondary | ICD-10-CM | POA: Diagnosis present

## 2021-05-21 DIAGNOSIS — R6520 Severe sepsis without septic shock: Secondary | ICD-10-CM | POA: Diagnosis present

## 2021-05-21 DIAGNOSIS — K6812 Psoas muscle abscess: Secondary | ICD-10-CM | POA: Diagnosis present

## 2021-05-21 DIAGNOSIS — Z66 Do not resuscitate: Secondary | ICD-10-CM | POA: Diagnosis present

## 2021-05-21 DIAGNOSIS — E1169 Type 2 diabetes mellitus with other specified complication: Secondary | ICD-10-CM | POA: Diagnosis present

## 2021-05-21 DIAGNOSIS — I482 Chronic atrial fibrillation, unspecified: Secondary | ICD-10-CM | POA: Diagnosis not present

## 2021-05-21 DIAGNOSIS — I10 Essential (primary) hypertension: Secondary | ICD-10-CM | POA: Diagnosis not present

## 2021-05-21 DIAGNOSIS — Z8673 Personal history of transient ischemic attack (TIA), and cerebral infarction without residual deficits: Secondary | ICD-10-CM | POA: Diagnosis not present

## 2021-05-21 DIAGNOSIS — Z7951 Long term (current) use of inhaled steroids: Secondary | ICD-10-CM | POA: Diagnosis not present

## 2021-05-21 DIAGNOSIS — E1151 Type 2 diabetes mellitus with diabetic peripheral angiopathy without gangrene: Secondary | ICD-10-CM | POA: Diagnosis present

## 2021-05-21 DIAGNOSIS — Z79899 Other long term (current) drug therapy: Secondary | ICD-10-CM | POA: Diagnosis not present

## 2021-05-21 DIAGNOSIS — Z96643 Presence of artificial hip joint, bilateral: Secondary | ICD-10-CM | POA: Diagnosis present

## 2021-05-21 DIAGNOSIS — E11319 Type 2 diabetes mellitus with unspecified diabetic retinopathy without macular edema: Secondary | ICD-10-CM

## 2021-05-21 DIAGNOSIS — I5032 Chronic diastolic (congestive) heart failure: Secondary | ICD-10-CM

## 2021-05-21 DIAGNOSIS — E7800 Pure hypercholesterolemia, unspecified: Secondary | ICD-10-CM

## 2021-05-21 DIAGNOSIS — J9601 Acute respiratory failure with hypoxia: Secondary | ICD-10-CM | POA: Diagnosis present

## 2021-05-21 DIAGNOSIS — I4819 Other persistent atrial fibrillation: Secondary | ICD-10-CM | POA: Diagnosis present

## 2021-05-21 DIAGNOSIS — L8990 Pressure ulcer of unspecified site, unspecified stage: Secondary | ICD-10-CM | POA: Diagnosis present

## 2021-05-21 DIAGNOSIS — R4182 Altered mental status, unspecified: Secondary | ICD-10-CM | POA: Diagnosis present

## 2021-05-21 DIAGNOSIS — K219 Gastro-esophageal reflux disease without esophagitis: Secondary | ICD-10-CM | POA: Diagnosis present

## 2021-05-21 DIAGNOSIS — M069 Rheumatoid arthritis, unspecified: Secondary | ICD-10-CM | POA: Diagnosis present

## 2021-05-21 DIAGNOSIS — Z7952 Long term (current) use of systemic steroids: Secondary | ICD-10-CM | POA: Diagnosis not present

## 2021-05-21 DIAGNOSIS — Z794 Long term (current) use of insulin: Secondary | ICD-10-CM | POA: Diagnosis not present

## 2021-05-21 DIAGNOSIS — Z20822 Contact with and (suspected) exposure to covid-19: Secondary | ICD-10-CM | POA: Diagnosis present

## 2021-05-21 DIAGNOSIS — E118 Type 2 diabetes mellitus with unspecified complications: Secondary | ICD-10-CM

## 2021-05-21 LAB — URINALYSIS, ROUTINE W REFLEX MICROSCOPIC
Bacteria, UA: NONE SEEN
Bilirubin Urine: NEGATIVE
Glucose, UA: NEGATIVE mg/dL
Hgb urine dipstick: NEGATIVE
Ketones, ur: 5 mg/dL — AB
Nitrite: NEGATIVE
Protein, ur: 100 mg/dL — AB
Specific Gravity, Urine: >1.046 — ABNORMAL HIGH (ref 1.005–1.030)
pH: 5 (ref 5.0–8.0)

## 2021-05-21 LAB — LACTIC ACID, PLASMA
Lactic Acid, Venous: 6.7 mmol/L (ref 0.5–1.9)
Lactic Acid, Venous: 5.4 mmol/L (ref 0.5–1.9)
Lactic Acid, Venous: 4.2 mmol/L (ref 0.5–1.9)

## 2021-05-21 LAB — COMPREHENSIVE METABOLIC PANEL
ALT: 78 U/L — ABNORMAL HIGH (ref 0–44)
AST: 285 U/L — ABNORMAL HIGH (ref 15–41)
Albumin: 2.5 g/dL — ABNORMAL LOW (ref 3.5–5.0)
Alkaline Phosphatase: 106 U/L (ref 38–126)
Anion gap: 13 (ref 5–15)
BUN: 20 mg/dL (ref 8–23)
CO2: 23 mmol/L (ref 22–32)
Calcium: 7.7 mg/dL — ABNORMAL LOW (ref 8.9–10.3)
Chloride: 103 mmol/L (ref 98–111)
Creatinine, Ser: 1.02 mg/dL — ABNORMAL HIGH (ref 0.44–1.00)
GFR, Estimated: 57 mL/min — ABNORMAL LOW (ref >60)
Glucose, Bld: 96 mg/dL (ref 70–99)
Potassium: 3.7 mmol/L (ref 3.5–5.1)
Sodium: 139 mmol/L (ref 135–145)
Total Bilirubin: 0.9 mg/dL (ref 0.3–1.2)
Total Protein: 5.4 g/dL — ABNORMAL LOW (ref 6.5–8.1)

## 2021-05-21 LAB — ECHOCARDIOGRAM COMPLETE
AR max vel: 2.17 cm2
AV Area VTI: 2.28 cm2
AV Area mean vel: 2.14 cm2
AV Mean grad: 4 mmHg
AV Peak grad: 6.7 mmHg
Ao pk vel: 1.29 m/s
Area-P 1/2: 4.68 cm2
Height: 67 in
MV VTI: 1.53 cm2
S' Lateral: 2.5 cm
Weight: 2945.35 oz

## 2021-05-21 LAB — BLOOD CULTURE ID PANEL (REFLEXED) - BCID2

## 2021-05-21 LAB — CBC WITH DIFFERENTIAL/PLATELET
Band Neutrophils: 14 %
Basophils Absolute: 0 10*3/uL (ref 0.0–0.1)
Basophils Relative: 0 %
Eosinophils Absolute: 0 10*3/uL (ref 0.0–0.5)
Eosinophils Relative: 0 %
HCT: 35.8 % — ABNORMAL LOW (ref 36.0–46.0)
Hemoglobin: 10.9 g/dL — ABNORMAL LOW (ref 12.0–15.0)
Lymphocytes Relative: 10 %
Lymphs Abs: 1.2 10*3/uL (ref 0.7–4.0)
MCH: 26.8 pg (ref 26.0–34.0)
MCHC: 30.4 g/dL (ref 30.0–36.0)
MCV: 88.2 fL (ref 80.0–100.0)
Metamyelocytes Relative: 3 %
Monocytes Absolute: 0 10*3/uL — ABNORMAL LOW (ref 0.1–1.0)
Monocytes Relative: 0 %
Neutro Abs: 10.1 10*3/uL — ABNORMAL HIGH (ref 1.7–7.7)
Neutrophils Relative %: 73 %
Platelets: 234 10*3/uL (ref 150–400)
RBC: 4.06 MIL/uL (ref 3.87–5.11)
RDW: 19.3 % — ABNORMAL HIGH (ref 11.5–15.5)
WBC: 11.6 10*3/uL — ABNORMAL HIGH (ref 4.0–10.5)
nRBC: 0 % (ref 0.0–0.2)

## 2021-05-21 LAB — PROTIME-INR
INR: 1.8 — ABNORMAL HIGH (ref 0.8–1.2)
Prothrombin Time: 21.2 seconds — ABNORMAL HIGH (ref 11.4–15.2)

## 2021-05-21 LAB — GLUCOSE, CAPILLARY
Glucose-Capillary: 111 mg/dL — ABNORMAL HIGH (ref 70–99)
Glucose-Capillary: 111 mg/dL — ABNORMAL HIGH (ref 70–99)
Glucose-Capillary: 118 mg/dL — ABNORMAL HIGH (ref 70–99)

## 2021-05-21 LAB — CORTISOL-AM, BLOOD: Cortisol - AM: >100 ug/dL — ABNORMAL HIGH (ref 6.7–22.6)

## 2021-05-21 LAB — RESP PANEL BY RT-PCR (FLU A&B, COVID) ARPGX2
Influenza A by PCR: NEGATIVE
Influenza B by PCR: NEGATIVE
SARS Coronavirus 2 by RT PCR: NEGATIVE

## 2021-05-21 LAB — PROCALCITONIN: Procalcitonin: 23.82 ng/mL

## 2021-05-21 LAB — HEMOGLOBIN A1C
Hgb A1c MFr Bld: 5.7 % — ABNORMAL HIGH (ref 4.8–5.6)
Mean Plasma Glucose: 116.89 mg/dL

## 2021-05-21 LAB — TROPONIN I (HIGH SENSITIVITY): Troponin I (High Sensitivity): 19 ng/L — ABNORMAL HIGH (ref <18)

## 2021-05-21 LAB — CBG MONITORING, ED
Glucose-Capillary: 91 mg/dL (ref 70–99)
Glucose-Capillary: 92 mg/dL (ref 70–99)

## 2021-05-21 LAB — MRSA NEXT GEN BY PCR, NASAL: MRSA by PCR Next Gen: NOT DETECTED

## 2021-05-21 LAB — MAGNESIUM: Magnesium: 1.3 mg/dL — ABNORMAL LOW (ref 1.7–2.4)

## 2021-05-21 MED ORDER — HYDROCORTISONE SOD SUC (PF) 100 MG IJ SOLR
100.0000 mg | Freq: Two times a day (BID) | INTRAMUSCULAR | Status: DC
  Administered 2021-05-21 – 2021-05-23 (×5): 100 mg via INTRAVENOUS
  Filled 2021-05-21 (×5): qty 2

## 2021-05-21 MED ORDER — RIVAROXABAN 10 MG PO TABS
10.0000 mg | ORAL_TABLET | Freq: Every day | ORAL | Status: DC
  Filled 2021-05-21 (×3): qty 1

## 2021-05-21 MED ORDER — SENNA 8.6 MG PO TABS
1.0000 | ORAL_TABLET | Freq: Every day | ORAL | Status: DC
  Administered 2021-05-22 – 2021-05-25 (×3): 8.6 mg via ORAL
  Filled 2021-05-21 (×4): qty 1

## 2021-05-21 MED ORDER — VANCOMYCIN HCL 1250 MG/250ML IV SOLN: 1250.0000 mg | INTRAVENOUS | Status: DC

## 2021-05-21 MED ORDER — ONDANSETRON HCL 4 MG/2ML IJ SOLN: 4.0000 mg | Freq: Four times a day (QID) | INTRAMUSCULAR | Status: DC | PRN

## 2021-05-21 MED ORDER — ADULT MULTIVITAMIN W/MINERALS CH
1.0000 | ORAL_TABLET | Freq: Every day | ORAL | Status: DC
  Administered 2021-05-22 – 2021-05-25 (×4): 1 via ORAL
  Filled 2021-05-21 (×5): qty 1

## 2021-05-21 MED ORDER — ATORVASTATIN CALCIUM 10 MG PO TABS
20.0000 mg | ORAL_TABLET | Freq: Every day | ORAL | Status: DC
  Administered 2021-05-22 – 2021-05-25 (×4): 20 mg via ORAL
  Filled 2021-05-21: qty 1
  Filled 2021-05-21: qty 2
  Filled 2021-05-21 (×2): qty 1
  Filled 2021-05-21: qty 2

## 2021-05-21 MED ORDER — METOPROLOL TARTRATE 50 MG PO TABS
100.0000 mg | ORAL_TABLET | Freq: Two times a day (BID) | ORAL | Status: DC
  Administered 2021-05-22 – 2021-05-25 (×7): 100 mg via ORAL
  Filled 2021-05-21 (×8): qty 2

## 2021-05-21 MED ORDER — ONDANSETRON HCL 4 MG PO TABS: 4.0000 mg | ORAL_TABLET | Freq: Four times a day (QID) | ORAL | Status: DC | PRN

## 2021-05-21 MED ORDER — DILTIAZEM HCL ER COATED BEADS 240 MG PO CP24
240.0000 mg | ORAL_CAPSULE | Freq: Every day | ORAL | Status: DC
  Administered 2021-05-22 – 2021-05-25 (×4): 240 mg via ORAL
  Filled 2021-05-21 (×5): qty 1

## 2021-05-21 MED ORDER — METRONIDAZOLE 500 MG/100ML IV SOLN
500.0000 mg | Freq: Two times a day (BID) | INTRAVENOUS | Status: DC
  Administered 2021-05-21 – 2021-05-22 (×2): 500 mg via INTRAVENOUS
  Filled 2021-05-21 (×2): qty 100

## 2021-05-21 MED ORDER — ALBUTEROL SULFATE HFA 108 (90 BASE) MCG/ACT IN AERS: 2.0000 | INHALATION_SPRAY | RESPIRATORY_TRACT | Status: DC | PRN

## 2021-05-21 MED ORDER — INSULIN DETEMIR 100 UNIT/ML ~~LOC~~ SOLN
10.0000 [IU] | Freq: Every day | SUBCUTANEOUS | Status: DC
  Administered 2021-05-21 – 2021-05-23 (×3): 10 [IU] via SUBCUTANEOUS
  Filled 2021-05-21 (×4): qty 0.1

## 2021-05-21 MED ORDER — ACETAMINOPHEN 650 MG RE SUPP
650.0000 mg | Freq: Four times a day (QID) | RECTAL | Status: DC | PRN
  Administered 2021-05-21: 650 mg via RECTAL
  Filled 2021-05-21: qty 1

## 2021-05-21 MED ORDER — PANTOPRAZOLE SODIUM 40 MG PO TBEC
40.0000 mg | DELAYED_RELEASE_TABLET | Freq: Every day | ORAL | Status: DC
  Administered 2021-05-22 – 2021-05-25 (×4): 40 mg via ORAL
  Filled 2021-05-21 (×5): qty 1

## 2021-05-21 MED ORDER — KETOROLAC TROMETHAMINE 15 MG/ML IJ SOLN
15.0000 mg | Freq: Once | INTRAMUSCULAR | Status: AC
  Administered 2021-05-21: 15 mg via INTRAVENOUS
  Filled 2021-05-21: qty 1

## 2021-05-21 MED ORDER — OMEGA-3-ACID ETHYL ESTERS 1 G PO CAPS
1.0000 g | ORAL_CAPSULE | Freq: Every day | ORAL | Status: DC
  Administered 2021-05-22 – 2021-05-25 (×4): 1 g via ORAL
  Filled 2021-05-21 (×5): qty 1

## 2021-05-21 MED ORDER — ICOSAPENT ETHYL 1 G PO CAPS
1.0000 g | ORAL_CAPSULE | Freq: Every morning | ORAL | Status: DC
  Filled 2021-05-21 (×4): qty 1

## 2021-05-21 MED ORDER — SODIUM CHLORIDE 0.9 % IV SOLN: INTRAVENOUS | Status: DC

## 2021-05-21 MED ORDER — FUROSEMIDE 10 MG/ML IJ SOLN: 20.0000 mg | Freq: Once | INTRAMUSCULAR | Status: DC

## 2021-05-21 MED ORDER — INSULIN DETEMIR 100 UNIT/ML ~~LOC~~ SOLN
20.0000 [IU] | Freq: Every day | SUBCUTANEOUS | Status: DC
  Filled 2021-05-21: qty 0.2

## 2021-05-21 MED ORDER — ORAL CARE MOUTH RINSE
15.0000 mL | Freq: Two times a day (BID) | OROMUCOSAL | Status: DC
  Administered 2021-05-21 – 2021-05-25 (×8): 15 mL via OROMUCOSAL

## 2021-05-21 MED ORDER — CEFTRIAXONE SODIUM 2 G IJ SOLR
2.0000 g | INTRAMUSCULAR | Status: DC
  Administered 2021-05-21 – 2021-05-24 (×4): 2 g via INTRAVENOUS
  Filled 2021-05-21 (×4): qty 20

## 2021-05-21 MED ORDER — METOPROLOL TARTRATE 50 MG PO TABS
100.0000 mg | ORAL_TABLET | Freq: Two times a day (BID) | ORAL | Status: DC
Start: 2021-05-21 — End: 2021-05-21

## 2021-05-21 MED ORDER — INSULIN ASPART 100 UNIT/ML IJ SOLN: 0.0000 [IU] | Freq: Every day | INTRAMUSCULAR | Status: DC

## 2021-05-21 MED ORDER — PROSOURCE PLUS PO LIQD
30.0000 mL | Freq: Every day | ORAL | Status: DC
Start: 2021-05-21 — End: 2021-05-25
  Administered 2021-05-22 – 2021-05-25 (×4): 30 mL via ORAL
  Filled 2021-05-21 (×9): qty 30

## 2021-05-21 MED ORDER — PRO-STAT 64 PO LIQD: 30.0000 mL | Freq: Every morning | ORAL | Status: DC

## 2021-05-21 MED ORDER — MOMETASONE FURO-FORMOTEROL FUM 200-5 MCG/ACT IN AERO
2.0000 | INHALATION_SPRAY | Freq: Two times a day (BID) | RESPIRATORY_TRACT | Status: DC
  Administered 2021-05-21 – 2021-05-25 (×9): 2 via RESPIRATORY_TRACT
  Filled 2021-05-21: qty 8.8

## 2021-05-21 MED ORDER — INSULIN ASPART 100 UNIT/ML IJ SOLN
0.0000 [IU] | Freq: Three times a day (TID) | INTRAMUSCULAR | Status: DC
  Administered 2021-05-22: 3 [IU] via SUBCUTANEOUS
  Administered 2021-05-22: 2 [IU] via SUBCUTANEOUS
  Administered 2021-05-23: 3 [IU] via SUBCUTANEOUS
  Administered 2021-05-24: 2 [IU] via SUBCUTANEOUS

## 2021-05-21 MED ORDER — SPIRONOLACTONE 12.5 MG HALF TABLET
12.5000 mg | ORAL_TABLET | Freq: Every morning | ORAL | Status: DC
  Filled 2021-05-21 (×4): qty 1

## 2021-05-21 MED ORDER — ALBUTEROL SULFATE (2.5 MG/3ML) 0.083% IN NEBU: 2.5000 mg | INHALATION_SOLUTION | RESPIRATORY_TRACT | Status: DC | PRN

## 2021-05-21 MED ORDER — OXYCODONE HCL 5 MG PO TABS
5.0000 mg | ORAL_TABLET | ORAL | Status: DC | PRN
  Administered 2021-05-21 – 2021-05-25 (×10): 5 mg via ORAL
  Filled 2021-05-21 (×10): qty 1

## 2021-05-21 MED ORDER — CHLORHEXIDINE GLUCONATE CLOTH 2 % EX PADS
6.0000 | MEDICATED_PAD | Freq: Every day | CUTANEOUS | Status: DC
  Administered 2021-05-21 – 2021-05-22 (×2): 6 via TOPICAL

## 2021-05-21 MED ORDER — VANCOMYCIN HCL IN DEXTROSE 1-5 GM/200ML-% IV SOLN: 1000.0000 mg | Freq: Once | INTRAVENOUS | Status: DC

## 2021-05-21 MED ORDER — SODIUM CHLORIDE 0.9 % IV SOLN: 2.0000 g | Freq: Once | INTRAVENOUS | Status: DC

## 2021-05-21 MED ORDER — ACETAMINOPHEN 325 MG PO TABS: 650.0000 mg | ORAL_TABLET | Freq: Four times a day (QID) | ORAL | Status: DC | PRN

## 2021-05-21 MED ORDER — BUPROPION HCL ER (SR) 100 MG PO TB12
100.0000 mg | ORAL_TABLET | Freq: Two times a day (BID) | ORAL | Status: DC
  Administered 2021-05-21 – 2021-05-25 (×8): 100 mg via ORAL
  Filled 2021-05-21 (×16): qty 1

## 2021-05-21 MED ORDER — SODIUM CHLORIDE 0.9 % IV SOLN
2.0000 g | Freq: Two times a day (BID) | INTRAVENOUS | Status: DC
  Administered 2021-05-21: 2 g via INTRAVENOUS
  Filled 2021-05-21 (×2): qty 2

## 2021-05-21 MED ORDER — GABAPENTIN 300 MG PO CAPS
300.0000 mg | ORAL_CAPSULE | Freq: Three times a day (TID) | ORAL | Status: DC
  Filled 2021-05-21: qty 1

## 2021-05-21 MED ORDER — MAGNESIUM SULFATE 4 GM/100ML IV SOLN
4.0000 g | Freq: Once | INTRAVENOUS | Status: AC
  Administered 2021-05-21: 4 g via INTRAVENOUS
  Filled 2021-05-21: qty 100

## 2021-05-21 NOTE — Assessment & Plan Note (Addendum)
-   Continue treatment with statins -Heart healthy diet recommended.

## 2021-05-21 NOTE — ED Notes (Signed)
X-ray at bedside

## 2021-05-21 NOTE — ED Notes (Signed)
Pt is more alert and moving around

## 2021-05-21 NOTE — H&P (Addendum)
History and Physical    Patient: [name] DOB: [birth year] DOA: 05/20/2021 DOS: the patient was seen and examined on 05/21/2021 PCP: Aletha Halim., PA-C  Patient coming from: SNF  Chief Complaint:  Chief Complaint  Patient presents with   Altered Mental Status    HPI: [name] is a 76 y.o. female with medical history significant of with history of anemia secondary to chronic disease, atrial fibrillation, chronic diastolic heart failure, hyperlipidemia, hypertension, diabetes type 2, stroke, and more presents ED with a chief complaint of altered mental status.  Unfortunately patient is not able to provide any meaningful history.  Chart review reveals that patient has been having some nausea, vomiting, and then became altered.  She was noted to be tacky by EMS.  She does have any history of atrial fibrillation.  She is initially saturating 85% on room room air when she arrived and she was placed on oxygen.  At presentation she was noted to be lethargic, partially responsive.  At admission she is more alert, but still confused and pulling at lines and oxygen etc.  Patient is able to state that she is not in any pain right now.  She does not answer when I asked her about shortness of breath.  She knows her full name, but she thinks she is at [facility].  Patient has no other complaints at this time.  ED provider spoke with son who reported the patient would not want CPR or intubation, or "anything heroic."    Review of Systems: unable to review all systems due to the inability of the patient to answer questions. Past Medical History:  Diagnosis Date   Abnormal LFTs 07/24/2017   Actinic keratosis    Anemia    hx of   Anemia of chronic renal failure, stage 3 (moderate) ([place]) 07/17/2015   Anticoagulant causing adverse effect in therapeutic use 07/17/2015   Anxiety    Arthritis    rheumatoid   Atrial fibrillation ([place])    a. persistent, she remains on Xarelto    Carpal tunnel syndrome    Chronic diastolic (congestive) heart failure ([place])    a. 04/2016: echo showing a preserved EF of 55-60% with mild MR. LA and RA midly dilated.    Edema    left leg at ankle resolved now   Gastroesophageal reflux disease    Hepatitis 1970   not sure what kind   Hiatal hernia    Hyperlipidemia    Hypertension    Iron deficiency anemia due to chronic blood loss 07/17/2015   Jaundice    age 69   Peripheral vascular disease ([place]) yrs ago   DVT left lower leg questionale told by 2 drs i had no clot, 1 md said i did   Rheumatoid arthritis(714.0)    Right knee pain    Stroke ([place])    Type II diabetes mellitus ([place])    type2   Past Surgical History:  Procedure Laterality Date   CATARACT EXTRACTION Bilateral    COLONOSCOPY WITH PROPOFOL N/A 02/20/2015   Procedure: COLONOSCOPY WITH PROPOFOL;  Surgeon: Carol Ada, MD;  Location: [facility];  Service: Endoscopy;  Laterality: N/A;   ENTEROSCOPY N/A 03/23/2016   Procedure: ENTEROSCOPY;  Surgeon: Carol Ada, MD;  Location: [facility];  Service: Endoscopy;  Laterality: N/A;   ENTEROSCOPY N/A 06/17/2016   Procedure: ENTEROSCOPY;  Surgeon: Carol Ada, MD;  [facility]: [facility];  Service: Endoscopy;  Laterality: N/A;   ESOPHAGOGASTRODUODENOSCOPY (EGD) WITH  PROPOFOL N/A 02/20/2015   Procedure: ESOPHAGOGASTRODUODENOSCOPY (EGD) WITH PROPOFOL;  Surgeon: Carol Ada, MD;  [facility]: [facility];  Service: Endoscopy;  Laterality: N/A;   GIVENS CAPSULE STUDY N/A 03/21/2016   Procedure: GIVENS CAPSULE STUDY;  Surgeon: Carol Ada, MD;  [facility]: [facility];  Service: Endoscopy;  Laterality: N/A;   HAND SURGERY  1995 and 1996   artificial joints both hands   JOINT REPLACEMENT     LUMBAR LAMINECTOMY/DECOMPRESSION MICRODISCECTOMY N/A 02/10/2016   Procedure: MICROLUMBAR DECOMPRESSION L4-L5 AND L3- L4, AND EXCISION OF SYNOVIAL CYST L4-L5;  Surgeon: Susa Day, MD;  Location: [facility];  Service: Orthopedics;  Laterality:  N/A;   TOTAL HIP ARTHROPLASTY Left 2009   TOTAL HIP ARTHROPLASTY Right 04/03/2013   Procedure: RIGHT TOTAL HIP ARTHROPLASTY ANTERIOR APPROACH;  Surgeon: Gearlean Alf, MD;  Location: [facility];  Service: Orthopedics;  Laterality: Right;   Social History:  reports that she has never smoked. She has never used smokeless tobacco. She reports that she does not drink alcohol and does not use drugs.  Allergies  Allergen Reactions   Promethazine     Family History  Problem Relation Age of Onset   Pneumonia Mother 12   Stroke Father 77   Heart failure Sister    Hypertension Sister    Heart attack Neg Hx     Prior to Admission medications   Medication Sig Start Date End Date Taking? Authorizing Provider  acetaminophen (TYLENOL) 650 MG CR tablet Take 1,300 mg by mouth in the morning and at bedtime.    [provider]  albuterol (VENTOLIN HFA) 108 (90 Base) MCG/ACT inhaler Inhale 2 puffs into the lungs every 4 (four) hours as needed for wheezing or shortness of breath.    [provider]  Amino Acids-Protein Hydrolys (FEEDING SUPPLEMENT, PRO-STAT 64,) LIQD Take 30 mLs by mouth in the morning.    [provider]  Ascorbic Acid (VITAMIN C PO) Take 500 mg by mouth in the morning and at bedtime.    [provider]  atorvastatin (LIPITOR) 20 MG tablet Take 20 mg by mouth daily. 01/27/20   [provider]  azelastine (OPTIVAR) 0.05 % ophthalmic solution Place 1 drop into both eyes 2 (two) times daily.    [provider]  buPROPion (WELLBUTRIN SR) 150 MG 12 hr tablet Take 100 mg by mouth 2 (two) times daily.    [provider]  carboxymethylcellulose (ARTIFICIAL TEARS) 1 % ophthalmic solution Place 2 drops into both eyes in the morning.    [provider]  Cholecalciferol (VITAMIN D3) 1000 units CAPS Take 2,000 Units by mouth daily.    [provider]  Cream Base (REJUVACARE PLUS) CREA Apply 1 application topically in the  morning and at bedtime. Apply to face for dry skin patches    [provider]  cycloSPORINE (RESTASIS) 0.05 % ophthalmic emulsion Place 1 drop into both eyes 2 (two) times daily.    [provider]  diclofenac Sodium (VOLTAREN) 1 % GEL Apply 2 g topically at bedtime as needed for pain. 03/15/20   [provider]  diltiazem (CARDIZEM CD) 240 MG 24 hr capsule Take 240 mg by mouth daily. 01/27/20   [provider]  Eyelid Cleansers (OCUSOFT LID SCRUB ORIGINAL) PADS Apply 1 application topically in the morning and at bedtime. Apply to both eyelids for eye problem    [provider]  fluticasone-salmeterol (ADVAIR HFA) 115-21 MCG/ACT inhaler Inhale 2 puffs into the lungs 2 times daily  at 12 noon and 4 pm.    [provider]  furosemide (LASIX) 40 MG tablet Take 40 mg by mouth daily.     [provider]  gabapentin (NEURONTIN) 100 MG capsule Take 3 capsules (300 mg total) by mouth 3 (three) times daily. 10/10/17   Kathie Dike, MD  HYDROcodone-acetaminophen (NORCO/VICODIN) 5-325 MG tablet Take 1 tablet by mouth every 6 (six) hours as needed. 04/03/20   Danford, Suann Larry, MD  insulin glargine (LANTUS) 100 UNIT/ML injection Inject 30 Units into the skin in the morning. Hold if BS is less than 150    [provider]  insulin lispro (HUMALOG) 100 UNIT/ML injection Inject 6 Units into the skin in the morning and at bedtime. Hold is BS <100    [provider]  lidocaine (LIDODERM) 5 % Place 1 patch onto the skin daily. Remove & Discard patch within 12 hours or as directed by MD    [provider]  metFORMIN (GLUCOPHAGE-XR) 500 MG 24 hr tablet Take 1,000 mg by mouth 2 (two) times daily. 07/31/17   [provider]  metoprolol tartrate (LOPRESSOR) 100 MG tablet Take 1 tablet (100 mg total) by mouth 3 (three) times daily. Patient taking differently: Take 100 mg by mouth 2 (two) times daily. 03/06/18   Barton Dubois,  MD  Multiple Vitamins-Minerals (CERTAGEN PO) Take 1 tablet by mouth in the morning. For wound healing    [provider]  Multiple Vitamins-Minerals (PRESERVISION AREDS 2) CAPS Take 1 capsule by mouth daily.     [provider]  nitroGLYCERIN (NITROSTAT) 0.4 MG SL tablet Place 1 tablet (0.4 mg total) under the tongue every 5 (five) minutes as needed for chest pain (x 3 doses). 11/28/16   Lelon Perla, MD  omeprazole (PRILOSEC) 20 MG capsule Take 1 capsule by mouth daily. 02/12/18   [provider]  oxyCODONE (OXY IR/ROXICODONE) 5 MG immediate release tablet Take 1.5 tablets (7.5 mg total) by mouth every 6 (six) hours as needed (Pain). 04/03/20   Danford, Suann Larry, MD  predniSONE (DELTASONE) 5 MG tablet Take 5 mg by mouth daily.    [provider]  Rivaroxaban (XARELTO) 15 MG TABS tablet Take 1 tablet (15 mg total) by mouth daily. Patient taking differently: Take 10 mg by mouth daily. 07/28/17   Kathrene Alu, MD  senna (SENOKOT) 8.6 MG TABS tablet Take 1 tablet by mouth daily.    [provider]  spironolactone (ALDACTONE) 25 MG tablet Take 12.5 mg by mouth in the morning. 01/27/20   [provider]  VASCEPA 1 g CAPS Take 1 capsule by mouth every morning. 02/25/18   [provider]  VOLTAREN 1 % GEL Apply 2 g topically at bedtime as needed for pain. Knees, calf 04/27/17   [provider]  zinc sulfate 220 (50 Zn) MG capsule Take 220 mg by mouth daily.    [provider]  ferrous gluconate (FERGON) 324 MG tablet Take 1 tablet (324 mg total) by mouth 2 (two) times daily with a meal. Patient not taking: Reported on 03/30/2020 05/17/16 04/01/20  Debbe Odea, MD  potassium chloride SA (K-DUR,KLOR-CON) 20 MEQ tablet Take 1 tablet (20 mEq total) by mouth 2 (two) times daily. Patient not taking: Reported on 03/30/2020 10/10/17 04/01/20  Kathie Dike, MD    Physical Exam: Vitals:   05/21/21 0516 05/21/21 0524  05/21/21 0552 05/21/21 0601  BP: 90/66  93/60   Pulse: (!) 110  (!) 109  Resp: 15  (!) 21   Temp:  (!) 102 F (38.9 C)  (!) 101.2 F (38.4 C)  TempSrc:  Rectal  Rectal  SpO2: 97%  98%   Weight:      Height:       1.  General: Patient lying supine in bed,  no acute distress   2. Psychiatric: Alert and oriented x 1 mood and behavior normal for situation, mostly cooperative with exam   3. Neurologic: Speech and language are normal, face is symmetric, moves all 4 extremities voluntarily, at baseline without acute deficits on limited exam   4. HEENMT:  Head is atraumatic, normocephalic, pupils reactive to light, neck is supple, trachea is midline, mucous membranes are moist   5. Respiratory : Lungs are clear to auscultation bilaterally without wheezing, rhonchi, rales, no cyanosis, no increase in work of breathing or accessory muscle use   6. Cardiovascular : Heart rate tachycardic, rhythm is irregularly irregular, systolic murmur, no rubs or gallops, no peripheral edema, peripheral pulses palpated   7. Gastrointestinal:  Abdomen is soft, nondistended, nontender to palpation bowel sounds active, no masses or organomegaly palpated   8. Skin:  Multiple ulcers on the skin including on the buttocks, labia, left lower extremity, and healing ulcers on the right lower extremity.  Pressure ulcer on the left foot.  Several of these are draining.  On the left lower extremity there are some with serosanguineous drainage, but others with some purulence.    9.Musculoskeletal:  No acute deformities or trauma, no asymmetry in tone, no peripheral edema, peripheral pulses palpated, tenderness to palpation of the wounds of the left lower extremity  Data Reviewed: In the ED Temp 97.9, heart rate 136-172, respiratory rate 25-33 blood pressure 132/82 85% on room air, and normalized to 98% on 4 L [place] Leukocytosis 21.2, hgb 12.7 Hypo K 3.2 AKI with Cr increase from baseline 0.7 to 1.05 Gap 19, alk  phos 149 Albumin 3.4 Blood culture pending Chest x-ray shows interstitial edema pattern.  No focal infiltrate, collapse, or visible effusion EKG shows A-fib with a heart rate of 167, QTc 387 Patient was started on sepsis protocol broad-spectrum antibiotics vancomycin, cefepime, Flagyl Diltiazem drip was started 2.5 L bolus given in the ED Glucose was 75 on the chemistry, so half an amp of D50 was given Patient was then started on 150 mL/h lactated Ringer's 2 g mag sulfate given 40 mEq of potassium given CT head shows no evidence of acute intracranial abnormality CT chest abdomen shows no PE.  Cardiomegaly with interstitial edema.  Pleural effusions right greater than left.  4.2 cm intramuscular fluid collection on the left iliopsoas muscle, which is partially cleared by streak artifact. Admission was requested for acute respiratory failure with hypoxia, sepsis secondary to unknown source at time of admission.   Assessment and Plan: Acute respiratory failure with hypoxia ([place])- (present on admission) Patient desatted down to 85% on room air Normalized with 4 L nasal cannula Mild likely secondary to sepsis CT does show interstitial edema and right greater than left pleural effusions Lasix ordered but not given secondary to soft pressures No PE on CT Wean off O2 as tolerated Albuterol as needed Continue to monitor  Hypomagnesemia- (present on admission) Magnesium initially 1.2 Replaced with 2 g mag sulfate Recheck in the a.m.  Sepsis ([place])- (present on admission) Patient has heart rate up to 172, respiratory rate up to 33, leukocytosis 21.2, lactic acid as high as 6.7-improving, acute respiratory failure with hypoxia,  AKI 2.5 L bolus given in ED Patient started on vancomycin, cefepime, Flagyl Source is still unclear, but likely to be cellulitis of the left lower extremity versus iliopsoas abscess seen on CT Consult IR to evaluate and manage iliopsoas abscess For now we will  continue broad-spectrum coverage with vancomycin, cefepime, Flagyl Patient's mental status has improved with treatment so far Sepsis order set utilized Blood cultures pending No pneumonia seen on imaging Urine is not indicative of UTI Continue to monitor   AKI (acute kidney injury) ([place])- (present on admission) Baseline creatinine 0.7, today 1.05 Avoid nephrotoxic agents when possible Initially given large fluid bolus for sepsis, however no CT showing interstitial edema, and right greater than left pleural effusions IV maintenance fluids LR 150 mill per hour have been discontinued Running is slightly improved 1.02 Continue to monitor  Chronic atrial fibrillation with RVR ([place])- (present on admission) Heart rate on EKG 167 Cardizem drip started and heart rate now 100-105 Cardizem drip Monitor on step down Currently holding 100 mg twice daily metoprolol and 240 mg Cardizem p.o. secondary to soft blood pressure Continue Xarelto  DM2 (diabetes mellitus, type 2) ([place]) Patient on 30 units of basal insulin at home Continue reduced dose of 20 units basal insulin while in the hospital Currently n.p.o. Sliding scale coverage Update hemoglobin A1c Continue to monitor  Hypercholesterolemia- (present on admission) Continue statin  Hypertension- (present on admission) Patient currently on Cardizem drip for A-fib RVR MAP 71 at admission This morning we will hold Cardizem 240 mg and metoprolol 100 mg We will also hold spironolactone Continue to monitor       Advance Care Planning:   Code Status: DNR   Consults: None  Family Communication: No family at bedside  Severity of Illness: The appropriate patient status for this patient is INPATIENT. Inpatient status is judged to be reasonable and necessary in order to provide the required intensity of service to ensure the patient's safety. The patient's presenting symptoms, physical exam findings, and initial radiographic and  laboratory data in the context of their chronic comorbidities is felt to place them at high risk for further clinical deterioration. Furthermore, it is not anticipated that the patient will be medically stable for discharge from the hospital within 2 midnights of admission.   * I certify that at the point of admission it is my clinical judgment that the patient will require inpatient hospital care spanning beyond 2 midnights from the point of admission due to high intensity of service, high risk for further deterioration and high frequency of surveillance required.*  Author: Rolla Plate, DO 05/21/2021 6:12 AM  For on call review [email or web address].

## 2021-05-21 NOTE — ED Notes (Signed)
Provider at bedside

## 2021-05-21 NOTE — Progress Notes (Signed)
Left message with [place] to obtain last time patient took xarelto for scheduled procedure tomorrow. Front desk rep stated they were in an "emergency situation" and no one would be able to speak with me but he would have someone call with that information.

## 2021-05-21 NOTE — Progress Notes (Signed)
Pharmacy Antibiotic Note  [name] is a 76 y.o. female admitted on 05/20/2021 with sepsis.  Pharmacy has been consulted for Vancomycin/Cefepime dosing.  Plan: Cefepime 2 grams iv Q 12 hours Vancomycin 1250 mg iv Q 24 hours (AUC of 514 using Scr 1.05)   Height: 5\' 7"  (170.2 cm) Weight: 82.1 kg (181 lb) (bed scale) IBW/kg (Calculated) : 61.6  Temp (24hrs), Avg:97.9 F (36.6 C), Min:97.9 F (36.6 C), Max:97.9 F (36.6 C)  Recent Labs  Lab 05/20/21 2212 05/21/21 0017  WBC 21.2*  --   CREATININE 1.05*  --   LATICACIDVEN 6.4* 6.7*    Estimated Creatinine Clearance: 50.2 mL/min (A) (by C-G formula based on SCr of 1.05 mg/dL (H)).    Allergies  Allergen Reactions   Promethazine     Thank you for allowing pharmacy to be a part of this patients care.  Tad Moore 05/21/2021 2:17 AM

## 2021-05-21 NOTE — Progress Notes (Signed)
PHARMACY - PHYSICIAN COMMUNICATION CRITICAL VALUE ALERT - BLOOD CULTURE IDENTIFICATION (BCID)  [name] is an 77 y.o. female who presented to [facility] on 05/20/2021 with a chief complaint of bacteremia   Assessment:  4/4 bottles gram + cocci strep species no resistance detected (include suspected source if known)  Name of physician (or Provider) Contacted: Dr Cathlean Sauer  Current antibiotics: cefepime   Changes to prescribed antibiotics recommended:  Response not received from provider;  current antibiotics are likely to cover the isolated organism.  Consider de-escalation soon.  Results for orders placed or performed during the hospital encounter of 03/30/20  Blood Culture ID Panel (Reflexed) (Collected: 03/30/2020 12:35 AM)  Result Value Ref Range   Enterococcus faecalis NOT DETECTED NOT DETECTED   Enterococcus Faecium NOT DETECTED NOT DETECTED   Listeria monocytogenes NOT DETECTED NOT DETECTED   Staphylococcus species NOT DETECTED NOT DETECTED   Staphylococcus aureus (BCID) NOT DETECTED NOT DETECTED   Staphylococcus epidermidis NOT DETECTED NOT DETECTED   Staphylococcus lugdunensis NOT DETECTED NOT DETECTED   Streptococcus species DETECTED (A) NOT DETECTED   Streptococcus agalactiae DETECTED (A) NOT DETECTED   Streptococcus pneumoniae NOT DETECTED NOT DETECTED   Streptococcus pyogenes NOT DETECTED NOT DETECTED   A.calcoaceticus-baumannii NOT DETECTED NOT DETECTED   Bacteroides fragilis NOT DETECTED NOT DETECTED   Enterobacterales NOT DETECTED NOT DETECTED   Enterobacter cloacae complex NOT DETECTED NOT DETECTED   Escherichia coli NOT DETECTED NOT DETECTED   Klebsiella aerogenes NOT DETECTED NOT DETECTED   Klebsiella oxytoca NOT DETECTED NOT DETECTED   Klebsiella pneumoniae NOT DETECTED NOT DETECTED   Proteus species NOT DETECTED NOT DETECTED   Salmonella species NOT DETECTED NOT DETECTED   Serratia marcescens NOT DETECTED NOT DETECTED   Haemophilus influenzae NOT DETECTED  NOT DETECTED   Neisseria meningitidis NOT DETECTED NOT DETECTED   Pseudomonas aeruginosa NOT DETECTED NOT DETECTED   Stenotrophomonas maltophilia NOT DETECTED NOT DETECTED   Candida albicans NOT DETECTED NOT DETECTED   Candida auris NOT DETECTED NOT DETECTED   Candida glabrata NOT DETECTED NOT DETECTED   Candida krusei NOT DETECTED NOT DETECTED   Candida parapsilosis NOT DETECTED NOT DETECTED   Candida tropicalis NOT DETECTED NOT DETECTED   Cryptococcus neoformans/gattii NOT DETECTED NOT DETECTED    Donna Christen [name] 05/21/2021  2:07 PM

## 2021-05-21 NOTE — ED Notes (Signed)
Patient transported to CT 

## 2021-05-21 NOTE — Progress Notes (Signed)
Called [facility] to schedule pickup for patient 05/22/2021 to arrive at interventional radiology at [place] confirmed and stated they will do their best to get her there at that time.

## 2021-05-21 NOTE — Assessment & Plan Note (Signed)
Patient desatted down to 85% on room air Normalized with 4 L nasal cannula Mild likely secondary to sepsis CT does show interstitial edema and right greater than left pleural effusions Lasix ordered but not given secondary to soft pressures No PE on CT Wean off O2 as tolerated Albuterol as needed Continue to monitor

## 2021-05-21 NOTE — Assessment & Plan Note (Addendum)
Patient with severe sepsis due to iliopsoas abscess with end-organ failure acute hypoxemic respiratory failure.   -Continue following WBCs trend; patient is afebrile. -S/P iliopsoas abscess drainage by IR; final culture pending at discharge.  No specific growth identified (unfortunately drainage has happened after patient being on antibiotic therapy). -Blood cultures positive for streptococcus group c, resistant only to clindamycin and erythromycin. -Patient responded well to treatment with IV Rocephin. -Case discussed with infectious disease service who has recommended 16 more days using cefadroxil therapy. -2D echo demonstrated no bipolar disorder. -Patient afebrile and stable at time of discharge. -Continue as needed analgesics. -Discontinued stress dose steroids and transition to home dose of chronic prednisone 5 mg daily.  -Physical therapy has seen patient and recommended home health PT; which can be provided at a skilled nursing facility for rehabilitation.

## 2021-05-21 NOTE — ED Notes (Signed)
Date and time results received: 05/21/21 0338 (use smartphrase ".now" to insert current time)  Test: lactic acid Critical Value: 5.4  Name of Provider Notified: Somalia Zierle-Ghosh, DO  Orders Received? Or Actions Taken?:  n/a

## 2021-05-21 NOTE — Assessment & Plan Note (Addendum)
-  Blood pressure has remained stable -Continue the use of metoprolol and diltiazem -Continue to follow vital signs. -Maintain adequate hydration and follow heart healthy diet.

## 2021-05-21 NOTE — ED Notes (Signed)
Notified provider of pt temp and agitation. Pt is constantly moving in bed and throwing arms around. Pt will randomly pull at equipment leads.

## 2021-05-21 NOTE — ED Notes (Signed)
Rechecked pt's temperature after administering tylenol suppository. Pt temperature increased to 102.7 rectally. Notified provider. Provider ordered toradol. RN will continue to monitor

## 2021-05-21 NOTE — Progress Notes (Signed)
°  76 y.o. female inpatient [name] [facility] ED). History of a fib HLD, HTN, DM, CVA, anemia of chronic disease. Presented from SNF to the ED at [facility] on 2.17.22 with AMS, nausea and vomiting.  Found to have a left iliopsoas abscess. CT CAP reads 2.7 x 3.4 x 4.2 cm fluid collection in the left iliopsoas muscle (series 13/image 78), partially obscured by streak. artifact. Team is requesting a left psoas muscle a  Patient is on xarelto (last dose on 2.17.23). Lactic acid 4.2, WBC 11.6, Cr 1.02, albumin 2.5,AST 285, ALT 78, total protein 5.4. patient is on 2 g rocephin daily. Flagyl BID. Vancomycin is listed as future medication.  IR consulted for possible left iliopsoas muscle abscess drain placement. Case has been reviewed and procedure approved by Dr. Vernard Gambles.  Patient tentatively scheduled for 2.18.22 @ 0900.  Team instructed to: Keep Patient to be NPO after midnight Hold prophylactic anticoagulation 24 hours prior to scheduled procedure. Coordinate for [facility] for patient to arrive at [facility] IR department at 0900 @ 2.18.22   Risks and benefits discussed with the patient's son including, but not limited to bleeding, infection, vascular injury, pneumothorax which may require chest tube placement, air embolism or even death  All of the patient's son's questions were answered, patient is agreeable to proceed. Consent signed and in IR control

## 2021-05-21 NOTE — Consult Note (Signed)
[place] Nurse wound consult note Consultation completed by use of Elink technology and with assistance from bedside nurse/clinical staff Reason for Consult:bilateral LE Patient with multiple wounds both full thickness and partial thickness over the LEs.Patient admitted with AMS from SNF.  Sepsis workup in progress; will be traveling to [facility] for IR procedure to drain abscess in her left thigh  Wound type:  Neuropathic foot ulcer left medial first metatarsall head; 100% pale pink Neuropathic vs arterial ulceration 3rd toe pink with exposed bone centrally, pink surrounding tissue with maceration of the webspace  Neuropathic vs arterial ulceration 5th toe, scabbed Scattered partial thickness scabbed areas over the LLE pretibial region Venous stasis right pretibial; 100% dry wound bed; non granular  Pressure Injury POA: NA Measurement:see nursing flow sheets Wound bed:see above  Drainage (amount, consistency, odor) none noted, no dressings in place  Periwound: intact  Dressing procedure/placement/frequency:  Cover all wounds with silicone foam for transport Hydrogel to the left medial first met head, and toe wounds Kellie Simmering # [phone]), apply small amount on each wound, cover with dry dressing, change daily Continue silicone foam to the right LE wounds, change every other day  4. No topical care to the LLE pretibial wounds, leave open to air.   Would recommend consultation from podiatry or orthopedics to address foot and toe wounds at some point.   Discussed POC with patient and bedside nurse.  Re consult if needed, will not follow at this time. Thanks  Greta Yung [employer], RN,CWOCN, CNS, [place] [phone])

## 2021-05-21 NOTE — ED Notes (Signed)
DNR armband placed on pt ?

## 2021-05-21 NOTE — TOC Initial Note (Signed)
Transition of Care [facility]) - Initial/Assessment Note    Patient Details  Name: [name] MRN: [id number] Date of Birth: 05/10/1945  Transition of Care [facility]) CM/SW Contact:    Iona Beard, [place] Phone Number: 05/21/2021, 11:41 AM  Clinical Narrative:                 TOC made aware that pt is resident from [facility]. CSW reached out to pts son who states that pt has been a resident at the facility for about 2 years. Plan is for pt to return at D/C. CSW reached out to [place] in admissions at [facility]. CSW awaiting response. TOC to follow.   Expected Discharge Plan: Long Term Nursing Home Barriers to Discharge: Continued Medical Work up   Patient Goals and CMS Choice Patient states their goals for this hospitalization and ongoing recovery are:: Return to LTC CMS Medicare.gov Compare Post Acute Care list provided to:: Patient Represenative (must comment) Choice offered to / list presented to : Adult Children  Expected Discharge Plan and Services Expected Discharge Plan: [place] Acute Care Choice: [place], Resumption of Svcs/PTA Provider Living arrangements for the past 2 months: [place]                                      Prior Living Arrangements/Services Living arrangements for the past 2 months: [place] Lives with:: Facility Resident Patient language and need for interpreter reviewed:: Yes Do you feel safe going back to the place where you live?: Yes      Need for Family Participation in Patient Care: Yes (Comment) Care giver support system in place?: Yes (comment)   Criminal Activity/Legal Involvement Pertinent to Current Situation/Hospitalization: No - Comment as needed  Activities of Daily Living Home Assistive Devices/Equipment: Dentures (specify type), Hearing aid, Walker (specify type), Wheelchair ADL Screening (condition at time of admission) Patient's cognitive  ability adequate to safely complete daily activities?: Yes Is the patient deaf or have difficulty hearing?: No Does the patient have difficulty seeing, even when wearing glasses/contacts?: No Does the patient have difficulty concentrating, remembering, or making decisions?: No Patient able to express need for assistance with ADLs?: Yes Does the patient have difficulty dressing or bathing?: No Independently performs ADLs?: Yes (appropriate for developmental age) Does the patient have difficulty walking or climbing stairs?: Yes Weakness of Legs: Both Weakness of Arms/Hands: None  Permission Sought/Granted                  Emotional Assessment Appearance:: Appears stated age       Alcohol / Substance Use: Not Applicable Psych Involvement: No (comment)  Admission diagnosis:  Atrial fibrillation with rapid ventricular response ([place]) [I48.91] Sepsis ([place]) [A41.9] Altered mental status, unspecified altered mental status type [R41.82] Sepsis, due to unspecified organism, unspecified whether acute organ dysfunction present [facility]) [A41.9] Patient Active Problem List   Diagnosis Date Noted   Acute respiratory failure with hypoxia ([place]) 05/21/2021   Hypokalemia 22/48/2500   Acute metabolic encephalopathy 37/07/8887   Sepsis due to undetermined organism ([place]) 03/30/2020   Hypomagnesemia 03/30/2020   Hypophosphatemia 03/30/2020   Hyperglycemia    Sepsis ([place]) 10/04/2017   HCAP (healthcare-associated pneumonia) 10/04/2017   Acute lower UTI 10/04/2017   Hallucinations 07/25/2017   Abnormal LFTs 07/24/2017   Acute on chronic diastolic CHF (congestive heart failure) (  [place])    Elevated LFTs    AKI (acute kidney injury) ([place])    Elevated troponin    Stroke, acute, embolic ([place]) 24/49/7530   Mitral regurgitation 06/17/2017   Acute CVA (cerebrovascular accident) ([place])    Thyroid nodule 06/15/2017   Atrial fibrillation with RVR ([place]) 06/14/2017   CVA (cerebral vascular accident) ([place])  06/14/2017   Type II diabetes mellitus ([place])    Peripheral vascular disease (HCC)    Hyperlipidemia    Hiatal hernia    Edema    Chronic diastolic (congestive) heart failure (HCC)    Carpal tunnel syndrome    Chronic atrial fibrillation with RVR (HCC)    Anemia    Actinic keratosis    Intractable nausea and vomiting 05/31/2016   Nausea & vomiting 05/30/2016   Abnormal chest x-ray 05/30/2016   Influenza A 05/21/2016   Acute CHF (congestive heart failure) ([place]) 05/20/2016   History of GI bleed 03/29/2016   Chronic anticoagulation 03/29/2016   Symptomatic anemia 03/19/2016   Spinal stenosis of lumbar region 02/10/2016   Varicose veins of left leg with edema 09/15/2015   Preop cardiovascular exam 08/25/2015   Iron deficiency anemia due to chronic blood loss 07/17/2015   Anemia of chronic renal failure, stage 3 (moderate) ([place]) 07/17/2015   Anticoagulant causing adverse effect in therapeutic use 07/17/2015   Symptomatic bradycardia 02/21/2015   Bradycardia    Acute deep vein thrombosis (DVT) of left lower extremity ([place]) 05/06/2014   Abdominal wall mass of right lower quadrant 07/08/2013   OA (osteoarthritis) of hip 04/03/2013   Osteoarthritis of hip 01/18/2013   Night sweats 09/06/2012   Chest pain    Anxiety and depression    Gastroesophageal reflux disease    Hepatitis    Jaundice    Hypertension 07/01/2010   Hypercholesterolemia 07/01/2010   DM2 (diabetes mellitus, type 2) ([place]) 07/01/2010   Rheumatoid arthritis ([place]) 07/01/2010   Sinus tachycardia 07/01/2010   Chest pain, atypical 07/01/2010   PCP:  Aletha Halim., PA-C Pharmacy:  No Pharmacies Listed    Social Determinants of Health (SDOH) Interventions    Readmission Risk Interventions Readmission Risk Prevention Plan 05/21/2021 04/01/2020  Transportation Screening Complete Complete  PCP or Specialist Appt within 3-5 Days - Not Complete  Not Complete comments - Patient's PCP appointments are set up at her  facility  [place] or [place] - Complete  Social Work Consult for [place] Planning/Counseling - Complete  Palliative Care Screening - Complete  Medication Review Press photographer) Complete Complete  SW Recovery Care/Counseling Consult Complete -  [place] Not Applicable -  Some recent data might be hidden

## 2021-05-21 NOTE — Assessment & Plan Note (Addendum)
-  Stage 2 pressure ulcer on back of left thigh.  -Patient also with Left foot ulcer at the base of the great toe and dorsal 3 th toe.  -And also with stage II buttocks pressure injury; no signs of superimposed infection. -Continue wound care and constant repositioning.

## 2021-05-21 NOTE — Progress Notes (Signed)
*  PRELIMINARY RESULTS* Echocardiogram 2D Echocardiogram has been performed.  [name] 05/21/2021, 12:03 PM

## 2021-05-21 NOTE — Assessment & Plan Note (Addendum)
-  Renal function has improved/resolved. -Patient advised to maintain adequate hydration -Repeat basic metabolic panel to follow ultralights and renal function; electrolytes were repleted and within normal limits at discharge. -Repeat basic metabolic panel at follow-up visit to reassess renal function and stability.

## 2021-05-21 NOTE — ED Notes (Signed)
NT and RN did an in and out for urine sample. Little to no urine output. Changed brief and checked rectal temp. Circular lesions noted on right leg bottom of buttocks towards labia majora.

## 2021-05-21 NOTE — Assessment & Plan Note (Addendum)
-  Her mentation has improved, patient follows commands and answers to simple questions appropriately. -Essentially back to baseline. -Continue supportive care on constant reorientation -Continue home dose of bupropion.

## 2021-05-21 NOTE — Assessment & Plan Note (Deleted)
Patient has heart rate up to 172, respiratory rate up to 33, leukocytosis 21.2, lactic acid as high as 6.7-improving, acute respiratory failure with hypoxia, AKI 2.5 L bolus given in ED Patient started on vancomycin, cefepime, Flagyl Source is still unclear, but likely to be cellulitis of the left lower extremity versus iliopsoas abscess seen on CT Consult IR to evaluate and manage iliopsoas abscess For now we will continue broad-spectrum coverage with vancomycin, cefepime, Flagyl Patient's mental status has improved with treatment so far Sepsis order set utilized Blood cultures pending No pneumonia seen on imaging Urine is not indicative of UTI Continue to monitor

## 2021-05-21 NOTE — Assessment & Plan Note (Signed)
Magnesium initially 1.2 Replaced with 2 g mag sulfate Recheck in the a.m.

## 2021-05-21 NOTE — ED Notes (Signed)
Pt is talking to themself randomly

## 2021-05-21 NOTE — Assessment & Plan Note (Addendum)
-  Rate controlled -Continue to demonstrate abnormal rhythm. -Continue metoprolol and oral diltiazem. -Patient chronically on Xarelto for secondary prevention. -Continue patient follow-up with cardiology service.

## 2021-05-21 NOTE — Hospital Course (Addendum)
[name] was admitted to the hospital with the working diagnosis of sepsis due to left iliopsoas abscess, complicated with atrial fibrillation with RVR and hypoxemic respiratory failure.   76 yo female with the past medical history of anemia, atrial fibrillation, heart failure, dyslipidemia, hypertension and T2DM who presented with altered mental status. Patient no able to provide detailed history due to cognitive dysfunction. Apparently she had nausea, vomiting and altered mentation, that prompted her transfer from SNF to the ED. On her initial physical examination her BP 90/66, HR 110 to 109, RR 21 Temp 102. and oxygen saturation 98%, patient was confused oriented x1, lungs clear to auscultation, heart with S1 and S2 present with irregularly irregular rhythm, with no gallops, abdomen soft and non tender. No lower extremity edema. Pressure ulcer on left foot.   Na 140, K 3,2. Cl 99, bicarbonate 22, glucose 75 bun 21 and cr 1,0  Mg 1.2  BNP 760 High sensitive troponin 21. Venous lactic acid 6,4  Wbc 21,2, hgb 12,7, hct 43.1 and plt 303  Sars coivd 19 negative   Urine analysis with SG >1.046, 6-10 wbc   Head CT with no acute changes, positive old infarcts on the right side.   Chest radiograph with increased hilar vascular congestion Ct chest with no pulmonary embolism, bilateral ground glass opacities.  CT abdomen and pelvis with left iliopsoas muscle 4,2 cm intramuscular fluid collection and small bilateral pleural effusions, right greater than left.   EKG with 167 bpm, normal axis, normal qtc, atrial fibrillation with PVC, positive ST depression on V4 to V6 with no significant T wave changes.   Patient was placed on supplemental 02 per [place], and received broad spectrum antibiotic therapy.   IR consulted for IR drainage of left lower extremity abscess; so far cultures not demonstrating any growth. .  Blood cultures positive for streptococcus; actively receiving treatment with IV  Rocephin..   On IV diltiazem for atrial fibrillation; rate has remained controlled.

## 2021-05-21 NOTE — Sepsis Progress Note (Signed)
[facility] following Code Sepsis. 

## 2021-05-21 NOTE — ED Provider Notes (Signed)
Physical Exam  BP 90/71    Pulse 72    Temp 97.9 F (36.6 C) (Oral)    Resp (!) 27    Ht 5\' 7"  (1.702 m)    Wt 82.1 kg Comment: bed scale   SpO2 99%    BMI 28.35 kg/m   Physical Exam Vitals and nursing note reviewed.  Constitutional:      General: She is not in acute distress.    Appearance: She is well-developed. She is ill-appearing. She is not diaphoretic.     Comments: Patient is somnolent but arousable.  She appears somewhat confused when spoken to.  She is pale and acutely on chronically ill-appearing.  HENT:     Head: Normocephalic and atraumatic.  Cardiovascular:     Rate and Rhythm: Tachycardia present. Rhythm irregular.     Heart sounds: No murmur heard.   No friction rub. No gallop.  Pulmonary:     Effort: Pulmonary effort is normal. No respiratory distress.     Breath sounds: Normal breath sounds. No wheezing.  Abdominal:     General: Bowel sounds are normal. There is no distension.     Palpations: Abdomen is soft.     Tenderness: There is no abdominal tenderness.  Musculoskeletal:        General: Normal range of motion.     Cervical back: Normal range of motion and neck supple.  Skin:    General: Skin is warm and dry.  Neurological:     General: No focal deficit present.     Cranial Nerves: No cranial nerve deficit.    Procedures  Procedures  ED Course / MDM   Clinical Course as of 05/21/21 0214  Thu May 20, 2021  2215 Chest x-ray interpreted by me as no clear signs of pneumonia.  Awaiting radiology reading. [MB]    Clinical Course User Index [MB] Hayden Rasmussen, MD   Care assumed from Dr. Melina Copa at shift change.  Patient sent here from [facility] for evaluation of altered mental status.  Patient arrives here confused and tachycardic.  Laboratory studies were obtained and patient started on a Cardizem drip due to A-fib with RVR.  Laboratory studies show a white count of 22,000 along with a lactate of 6.4.  Septic work-up initiated by Dr. Melina Copa.   Cultures obtained and IV antibiotics administered.  Sepsis fluids also administered cautiously as there is evidence of CHF.  I assumed care awaiting results of CT scan of the head, chest, and abdomen and pelvis.  This was all unremarkable with the exception of a 4.2 cm intramuscular fluid collection in the left iliopsoas muscle, the etiology of which I am uncertain.  During my care, patient's heart rate has been controlled, but blood pressure has been trending downward.  Repeat lactate was 6.7.  I have titrated back on the Cardizem and will see how her heart rate and blood pressure respond.  IV fluids currently infusing.  I discussed care with patient's son, [name] over the telephone.  We have discussed what her wishes would be should her condition deteriorate.  He has informed me that his mother would not want intubated, CPR, or any sort of heroic measures to be undertaken, but is okay with antibiotics and medications up to that point.  Care discussed with Dr. Clearence Ped who will admit.  CRITICAL CARE Performed by: Veryl Speak Total critical care time: 35 minutes Critical care time was exclusive of separately billable procedures and treating other patients. Critical care was  necessary to treat or prevent imminent or life-threatening deterioration. Critical care was time spent personally by me on the following activities: development of treatment plan with patient and/or surrogate as well as nursing, discussions with consultants, evaluation of patient's response to treatment, examination of patient, obtaining history from patient or surrogate, ordering and performing treatments and interventions, ordering and review of laboratory studies, ordering and review of radiographic studies, pulse oximetry and re-evaluation of patient's condition.        Veryl Speak, MD 05/21/21 [phone]

## 2021-05-21 NOTE — Sepsis Progress Note (Signed)
Notified provider of need to order repeat lactic acid. ° °

## 2021-05-21 NOTE — Assessment & Plan Note (Addendum)
-  Mild hypoglycemic event was appreciated throughout hospitalization in the setting of poor oral intake. -Since then CBGs has remained stable and rising. -Continue adjusted dose of insulin therapy -Patient advised to avoid skipping meals and to maintain adequate hydration -Continue to follow CBGs and further adjust hypoglycemic regimen as needed.. -Continue the use of statins as part of hyperlipidemia management. -Heart healthy diet and modified carbohydrate diet recommended.

## 2021-05-21 NOTE — Progress Notes (Signed)
Progress Note   Patient: [name] UDJ:[id number] DOB: 10-Oct-1945 DOA: 05/20/2021     0 DOS: the patient was seen and examined on 05/21/2021   Brief hospital course: Mrs. Cockerill was admitted to the hospital with the working diagnosis of acute hypoxemic respiratory failure in the setting of sepsis.   76 yo female with the past medical history of anemia, atrial fibrillation, heart failure, dyslipidemia, hypertension and T2DM who presented with altered mental status. Patient no able to provide detailed history due to cognitive dysfunction. Apparently she had nausea, vomiting and altered mentation, that prompted her transfer from SNF to the ED. On her initial physical examination her BP 90/66, HR 110 to 109, RR 21 Temp 102. And oxygen saturation 98%, patient was confused oriented x1, lungs clear to auscultation, heart with S1 and S2 present with irregularly irregular rhythm, with no gallops, abdomen soft and non tender. No lower extremity edema. Pressure ulcer on left foot.   Na 140, K 3,2. Cl 99, bicarbonate 22, glucose 75 bun 21 and cr 1,0  Mg 1.2 BNP 760 High sensitive troponin 21. Venous lactic acid 6,4  Wbc 21,2, hgb 12,7, hct 43.1 and plt 303  Sars coivd 19 negative   Urine analysis with SG >1.046, 6-10 wbc   Head CT with no acute changes, positive old infarcts on the right side.   Chest radiograph with increased hilar vascular congestion Ct chest with no pulmonary embolism, bilateral ground glass opacities.  CT abdomen and pelvis with left iliopsoas muscle 4,2 cm intramuscular fluid collection and small bilateral pleural effusions, right greater than left.   EKG with 167 bpm, normal axis, normal qtc, atrial fibrillation with PVC, positive ST depression on V4 to V6 with no significant T wave changes.   Patient was placed on supplemental 02 per [place], and received broad spectrum antibiotic therapy.   Assessment and Plan: * Iliopsoas abscess on left [facility])- (present on  admission) Patient with severe sepsis due to iliopsoas abscess with end-organ failure acute hypoxemic respiratory failure.    Wbc is down to 11,6, her temperature has been low 97,5. Cultures continue with no growth.   Plan to continue with broad spectrum antibiotic therapy with cefepime and vancomycin.  Continue with metronidazole.  Plan for IR drainage of abscess.  Continue isotonic saline at 75 ml per hr.  Patient on prednisone as outpatient, will continue with stress dose steroids for now.   Chronic atrial fibrillation with RVR ([place])- (present on admission) Patient with better rate control. Her mentation continue to fluctuate this am. Plan to continue rate control with diltiazem IV  Holding anticoagulation in the setting of IR procedure.   Hypertension- (present on admission) Continue close monitoring of blood pressure, continue rate control with diltiazem infusion.  Systolic blood pressure has been 120 mmHg this am from 84 to 98.    Pressure injury of skin- (present on admission) Stage 2 pressure ulcer on back of left thigh.   Acute metabolic encephalopathy- (present on admission) Patient with fluctuating mentation,  Continue neuro checks per unit protocol. Encephalopathy likely related to sepsis.  Discontinue gabapentin.   Hypomagnesemia Magnesium initially 1.2 Replaced with 2 g mag sulfate Recheck in the a.m.  AKI (acute kidney injury) ([place])- (present on admission) Hypomagnesemia. Hypokalemia  Renal function with serum cr at 1,0 with K at 3,7 and serum bicarbonate at 23. Mg is 1,3  Add 2 g of Mag sulfate and follow up renal function in am. Continue isotonic saline at 75 ml per  hr   Type 2 diabetes mellitus with hyperlipidemia (HCC) Fasting glucose this am is 96. Patient with poor oral intake, will reduce dose of basal insulin and continue with insulin sliding scale for glucose cover and monitoring.   Hypercholesterolemia On statin therapy          Subjective: Patient very weak and deconditioned, she is responding to questions and following commands   Physical Exam: Vitals:   05/21/21 1038 05/21/21 1058 05/21/21 1105 05/21/21 1200  BP:   120/73 119/83  Pulse:   (!) 109   Resp:   (!) 24 (!) 29  Temp:  (!) 97.5 F (36.4 C) (!) 97.5 F (36.4 C) (!) 96.9 F (36.1 C)  TempSrc:  Rectal Rectal Rectal  SpO2:   95% 100%  Weight: 83.5 kg     Height: 5\' 7"  (1.702 m)      Neurology awake and alert, weak and deconditioned ENT mild pallor Cardiovascular with S1 and S2 present irregularly irregular with no gallops or murmurs No JVD Trace pitting bilateral lower extremity edema Respiratory with no wheezing or rhonchi Abdomen soft and non tender Left leg with ulcerated lesions, dry with no purulence, mild faint erythema at the left leg.   Data Reviewed:    Family Communication: no family at the bedside   Disposition: Status is: Inpatient Remains inpatient appropriate because: sepsis      Planned Discharge Destination: Home and Skilled nursing facility     Author: Tawni Millers, MD 05/21/2021 1:06 PM  For on call review [email or web address].

## 2021-05-22 ENCOUNTER — Inpatient Hospital Stay (HOSPITAL_COMMUNITY): Payer: 59

## 2021-05-22 DIAGNOSIS — E785 Hyperlipidemia, unspecified: Secondary | ICD-10-CM

## 2021-05-22 DIAGNOSIS — E1169 Type 2 diabetes mellitus with other specified complication: Secondary | ICD-10-CM

## 2021-05-22 DIAGNOSIS — G9341 Metabolic encephalopathy: Secondary | ICD-10-CM

## 2021-05-22 LAB — CBC
HCT: 35.2 % — ABNORMAL LOW (ref 36.0–46.0)
Hemoglobin: 10.5 g/dL — ABNORMAL LOW (ref 12.0–15.0)
MCH: 25.8 pg — ABNORMAL LOW (ref 26.0–34.0)
MCHC: 29.8 g/dL — ABNORMAL LOW (ref 30.0–36.0)
MCV: 86.5 fL (ref 80.0–100.0)
Platelets: 252 K/uL (ref 150–400)
RBC: 4.07 MIL/uL (ref 3.87–5.11)
RDW: 19.3 % — ABNORMAL HIGH (ref 11.5–15.5)
WBC: 16.5 K/uL — ABNORMAL HIGH (ref 4.0–10.5)
nRBC: 0 % (ref 0.0–0.2)

## 2021-05-22 LAB — BASIC METABOLIC PANEL
Anion gap: 15 (ref 5–15)
BUN: 25 mg/dL — ABNORMAL HIGH (ref 8–23)
CO2: 26 mmol/L (ref 22–32)
Calcium: 7.8 mg/dL — ABNORMAL LOW (ref 8.9–10.3)
Chloride: 102 mmol/L (ref 98–111)
Creatinine, Ser: 0.86 mg/dL (ref 0.44–1.00)
GFR, Estimated: >60 mL/min (ref >60)
Glucose, Bld: 118 mg/dL — ABNORMAL HIGH (ref 70–99)
Potassium: 3 mmol/L — ABNORMAL LOW (ref 3.5–5.1)
Sodium: 143 mmol/L (ref 135–145)

## 2021-05-22 LAB — URINE CULTURE: Culture: NO GROWTH

## 2021-05-22 LAB — GLUCOSE, CAPILLARY
Glucose-Capillary: 122 mg/dL — ABNORMAL HIGH (ref 70–99)
Glucose-Capillary: 169 mg/dL — ABNORMAL HIGH (ref 70–99)
Glucose-Capillary: 118 mg/dL — ABNORMAL HIGH (ref 70–99)

## 2021-05-22 LAB — MAGNESIUM: Magnesium: 2.5 mg/dL — ABNORMAL HIGH (ref 1.7–2.4)

## 2021-05-22 LAB — SYNOVIAL FLUID, CRYSTAL: Crystals, Fluid: NONE SEEN

## 2021-05-22 MED ORDER — FENTANYL CITRATE (PF) 100 MCG/2ML IJ SOLN
INTRAMUSCULAR | Status: AC | PRN
  Administered 2021-05-22 (×2): 50 ug via INTRAVENOUS

## 2021-05-22 MED ORDER — LIDOCAINE HCL 1 % IJ SOLN
INTRAMUSCULAR | Status: AC
  Filled 2021-05-22: qty 10

## 2021-05-22 MED ORDER — POTASSIUM CHLORIDE CRYS ER 20 MEQ PO TBCR
40.0000 meq | EXTENDED_RELEASE_TABLET | ORAL | Status: AC
  Administered 2021-05-22: 40 meq via ORAL
  Filled 2021-05-22: qty 2

## 2021-05-22 MED ORDER — MIDAZOLAM HCL 2 MG/2ML IJ SOLN
INTRAMUSCULAR | Status: AC
  Filled 2021-05-22: qty 2

## 2021-05-22 MED ORDER — LIDOCAINE HCL (PF) 1 % IJ SOLN
INTRAMUSCULAR | Status: AC | PRN
  Administered 2021-05-22: 15 mL

## 2021-05-22 MED ORDER — MIDAZOLAM HCL 2 MG/2ML IJ SOLN
INTRAMUSCULAR | Status: AC | PRN
  Administered 2021-05-22 (×2): .5 mg via INTRAVENOUS

## 2021-05-22 MED ORDER — FENTANYL CITRATE (PF) 100 MCG/2ML IJ SOLN
INTRAMUSCULAR | Status: AC
Start: 2021-05-22 — End: 2021-05-22
  Filled 2021-05-22: qty 2

## 2021-05-22 NOTE — Progress Notes (Signed)
Progress Note   Patient: [name] TKZ:[id number] DOB: 1945-10-03 DOA: 05/20/2021     1 DOS: the patient was seen and examined on 05/22/2021   Brief hospital course: [name] was admitted to the hospital with the working diagnosis of sepsis due to left iliopsoas abscess, complicated with atrial fibrillation with RVR and hypoxemic respiratory failure.   76 yo female with the past medical history of anemia, atrial fibrillation, heart failure, dyslipidemia, hypertension and T2DM who presented with altered mental status. Patient no able to provide detailed history due to cognitive dysfunction. Apparently she had nausea, vomiting and altered mentation, that prompted her transfer from SNF to the ED. On her initial physical examination her BP 90/66, HR 110 to 109, RR 21 Temp 102. and oxygen saturation 98%, patient was confused oriented x1, lungs clear to auscultation, heart with S1 and S2 present with irregularly irregular rhythm, with no gallops, abdomen soft and non tender. No lower extremity edema. Pressure ulcer on left foot.   Na 140, K 3,2. Cl 99, bicarbonate 22, glucose 75 bun 21 and cr 1,0  Mg 1.2 BNP 760 High sensitive troponin 21. Venous lactic acid 6,4  Wbc 21,2, hgb 12,7, hct 43.1 and plt 303  Sars coivd 19 negative   Urine analysis with SG >1.046, 6-10 wbc   Head CT with no acute changes, positive old infarcts on the right side.   Chest radiograph with increased hilar vascular congestion Ct chest with no pulmonary embolism, bilateral ground glass opacities.  CT abdomen and pelvis with left iliopsoas muscle 4,2 cm intramuscular fluid collection and small bilateral pleural effusions, right greater than left.   EKG with 167 bpm, normal axis, normal qtc, atrial fibrillation with PVC, positive ST depression on V4 to V6 with no significant T wave changes.   Patient was placed on supplemental 02 per [place], and received broad spectrum antibiotic therapy.   IR consulted for IR  drainage of left lower extremity abscess.  Blood cultures positive for streptococcus.  On IV diltiazem for atrial fibrillation rate control.   Assessment and Plan: * Iliopsoas abscess on left [facility])- (present on admission) Patient with severe sepsis due to iliopsoas abscess with end-organ failure acute hypoxemic respiratory failure.    Wbc continue to be elevated up to 16,5  Blood cultures positive for gram positive cocci, 2 out of 2.  Streptococcus species.  Oxygen saturation 95% on 2 L/min per [place]. (improved respiratory failure).   Plan for IR abscess drainage today in [facility] Continue antibiotic therapy with ceftriaxone. Will dc metronidazole.  Decrease NS to 50 ml per hr and follow up cell count, and temperature curve. Repeat blood cultures in am.   Continue stress dose steroids, patient on chronic prednisone at home.  Pain control with oxycodone.   Chronic atrial fibrillation with RVR ([place])- (present on admission) Persistent atrial fibrillation related to sepsis. Patient is NPO for IR procedure, plan to continue rate control with IV diltiazem.  Continue to hold on anticoagulation in preparation for IR abscess drainage.  Continue telemetry monitoring.  Continue with metoprolol and diltiazem po target HR less than 130 bpm.   Hypertension- (present on admission) Blood pressure this am 120 to 130.  Continue diltiazem for rate control atrial fibrillation, continue to hold on antihypertensive medications due to risk of hypotension.  At home patient on furosemide, spironolactone and metoprolol.   AKI (acute kidney injury) ([place])- (present on admission) Hypomagnesemia. Hypokalemia  Renal function with serum cr at 0,86 K is 3,0 and  serum bicarbonate is 26.  Plan to add 40 meq KCL x 2 doses and follow up electrolytes in am. Continue isotonic saline at 50 ml per hr. Avoid hypotension and nephrotoxic medications.    Type 2 diabetes mellitus with hyperlipidemia ([place])- (present on  admission) Patient has been NPO past midnight. Her fasting glucose is 118.  Plan to continue with insulin therapy with sliding scale and basal insulin 10 units glargine. Continue close capillary glucose monitoring.   Continue with statin therapy.   Pressure injury of skin- (present on admission) Stage 2 pressure ulcer on back of left thigh.  Left foot ulcer at the base of the great toe and dorsal 3 th toe.   Acute metabolic encephalopathy- (present on admission) Her mentation has improved, patient follows commands and answers to simple questions. Decreased hearing.  Continue to hold on gabapentin, continue with bupropion.    Hypomagnesemia Magnesium initially 1.2 Replaced with 2 g mag sulfate Recheck in the a.m.  Hypercholesterolemia On statin therapy         Subjective: Patient is feeling better, no dyspnea or chest pain, continue to have left lower extremity pain. Decreased hearing   Physical Exam: Vitals:   05/22/21 0730 05/22/21 0745 05/22/21 0800 05/22/21 0801  BP: 130/89  132/83   Pulse: 95  (!) 141 (!) 146  Resp: (!) 26  15 20   Temp:      TempSrc:      SpO2: 98% 93% 90% 95%  Weight:      Height:       Neurology awake and alert, decreased hearing but follows commands and answers to questions.  ENT no pallor Cardiovascular with S1 and S2 present, irregularly irregular with no gallops or murmurs No JVD No lower extremity edema, Respiratory with no wheezing or rales on anterior auscultation.  Abdomen soft and non tender Skin left foot with ulcer at the base of the first toe and dorsal 3 toe.          Data Reviewed:    Family Communication: I spoke over the phone with the patient's son about patient's  condition, plan of care, prognosis and all questions were addressed.   Disposition: Status is: Inpatient Remains inpatient appropriate because: sepsis       Planned Discharge Destination: Home      Author: Tawni Millers,  MD 05/22/2021 8:38 AM  For on call review [email or web address]

## 2021-05-22 NOTE — Consult Note (Signed)
Chief Complaint: Patient was seen in consultation today for  Chief Complaint  Patient presents with   Altered Mental Status    Referring Physician(s): Dr. Clearence Ped  Supervising Physician: Markus Daft  Patient Status: [name] inpatient   History of Present Illness: [name] is a 76 y.o. female with a medical history significant for atrial fibrillation (Xarelto), HTN, DM, CVA and anemia. [facility] on [facility]/21 with AMS, nausea/vomiting. She was found to have a left iliopsoas abscess.   CT abdomen/pelvis with contrast 05/20/21 IMPRESSION: 1. No evidence of pulmonary embolism. 2. Cardiomegaly with mild interstitial edema and small bilateral pleural effusions, right greater than left. 3. 4.2 cm intramuscular fluid collection the left iliopsoas muscle, partially obscured by streak artifact. 4. Uterine fibroids.  Interventional Radiology has been asked to evaluate this patient for an image-guided left iliopsoas fluid collection aspiration with possible drain placement. Imaging reviewed and procedure approved by Dr. Vernard Gambles.   Past Medical History:  Diagnosis Date   Abnormal LFTs 07/24/2017   Actinic keratosis    Anemia    hx of   Anemia of chronic renal failure, stage 3 (moderate) ([place]) 07/17/2015   Anticoagulant causing adverse effect in therapeutic use 07/17/2015   Anxiety    Arthritis    rheumatoid   Atrial fibrillation ([place])    a. persistent, she remains on Xarelto   Carpal tunnel syndrome    Chronic diastolic (congestive) heart failure ([place])    a. 04/2016: echo showing a preserved EF of 55-60% with mild MR. LA and RA midly dilated.    Edema    left leg at ankle resolved now   Gastroesophageal reflux disease    Hepatitis 1970   not sure what kind   Hiatal hernia    Hyperlipidemia    Hypertension    Iron deficiency anemia due to chronic blood loss 07/17/2015   Jaundice    age 79   Peripheral vascular disease ([place]) yrs ago    DVT left lower leg questionale told by 2 drs i had no clot, 1 md said i did   Rheumatoid arthritis(714.0)    Right knee pain    Stroke ([place])    Type II diabetes mellitus ([place])    type2    Past Surgical History:  Procedure Laterality Date   CATARACT EXTRACTION Bilateral    COLONOSCOPY WITH PROPOFOL N/A 02/20/2015   Procedure: COLONOSCOPY WITH PROPOFOL;  Surgeon: Carol Ada, MD;  Location: [facility];  Service: Endoscopy;  Laterality: N/A;   ENTEROSCOPY N/A 03/23/2016   Procedure: ENTEROSCOPY;  Surgeon: Carol Ada, MD;  Location: [facility];  Service: Endoscopy;  Laterality: N/A;   ENTEROSCOPY N/A 06/17/2016   Procedure: ENTEROSCOPY;  Surgeon: Carol Ada, MD;  Location: [facility];  Service: Endoscopy;  Laterality: N/A;   ESOPHAGOGASTRODUODENOSCOPY (EGD) WITH PROPOFOL N/A 02/20/2015   Procedure: ESOPHAGOGASTRODUODENOSCOPY (EGD) WITH PROPOFOL;  Surgeon: Carol Ada, MD;  Location: [facility];  Service: Endoscopy;  Laterality: N/A;   GIVENS CAPSULE STUDY N/A 03/21/2016   Procedure: GIVENS CAPSULE STUDY;  Surgeon: Carol Ada, MD;  Location: [facility];  Service: Endoscopy;  Laterality: N/A;   HAND SURGERY  1995 and 1996   artificial joints both hands   JOINT REPLACEMENT     LUMBAR LAMINECTOMY/DECOMPRESSION MICRODISCECTOMY N/A 02/10/2016   Procedure: MICROLUMBAR DECOMPRESSION L4-L5 AND L3- L4, AND EXCISION OF SYNOVIAL CYST L4-L5;  Surgeon: Susa Day, MD;  Location: [facility];  Service: Orthopedics;  Laterality: N/A;  TOTAL HIP ARTHROPLASTY Left 2009   TOTAL HIP ARTHROPLASTY Right 04/03/2013   Procedure: RIGHT TOTAL HIP ARTHROPLASTY ANTERIOR APPROACH;  Surgeon: Gearlean Alf, MD;  Location: [facility];  Service: Orthopedics;  Laterality: Right;    Allergies: Promethazine  Medications: Prior to Admission medications   Medication Sig Start Date End Date Taking? Authorizing Provider  acetaminophen (TYLENOL) 650 MG CR tablet Take 1,300 mg by mouth in the morning and at  bedtime.    [provider]  albuterol (VENTOLIN HFA) 108 (90 Base) MCG/ACT inhaler Inhale 2 puffs into the lungs every 4 (four) hours as needed for wheezing or shortness of breath.    [provider]  Amino Acids-Protein Hydrolys (FEEDING SUPPLEMENT, PRO-STAT 64,) LIQD Take 30 mLs by mouth in the morning.    [provider]  Ascorbic Acid (VITAMIN C PO) Take 500 mg by mouth in the morning and at bedtime.    [provider]  atorvastatin (LIPITOR) 20 MG tablet Take 20 mg by mouth daily. 01/27/20   [provider]  azelastine (OPTIVAR) 0.05 % ophthalmic solution Place 1 drop into both eyes 2 (two) times daily.    [provider]  buPROPion (WELLBUTRIN SR) 150 MG 12 hr tablet Take 100 mg by mouth 2 (two) times daily.    [provider]  carboxymethylcellulose (ARTIFICIAL TEARS) 1 % ophthalmic solution Place 2 drops into both eyes in the morning.    [provider]  Cholecalciferol (VITAMIN D3) 1000 units CAPS Take 2,000 Units by mouth daily.    [provider]  Cream Base (REJUVACARE PLUS) CREA Apply 1 application topically in the morning and at bedtime. Apply to face for dry skin patches    [provider]  cycloSPORINE (RESTASIS) 0.05 % ophthalmic emulsion Place 1 drop into both eyes 2 (two) times daily.    [provider]  diclofenac Sodium (VOLTAREN) 1 % GEL Apply 2 g topically at bedtime as needed for pain. 03/15/20   [provider]  diltiazem (CARDIZEM CD) 240 MG 24 hr capsule Take 240 mg by mouth daily. 01/27/20   [provider]  Eyelid Cleansers (OCUSOFT LID SCRUB ORIGINAL) PADS Apply 1 application topically in the morning and at bedtime. Apply to both eyelids for eye problem    [provider]  fluticasone-salmeterol (ADVAIR HFA) 115-21 MCG/ACT inhaler Inhale 2 puffs into the lungs 2 times daily at 12 noon and 4 pm.    [provider]  furosemide (LASIX) 40 MG  tablet Take 40 mg by mouth daily.     [provider]  gabapentin (NEURONTIN) 100 MG capsule Take 3 capsules (300 mg total) by mouth 3 (three) times daily. 10/10/17   Kathie Dike, MD  HYDROcodone-acetaminophen (NORCO/VICODIN) 5-325 MG tablet Take 1 tablet by mouth every 6 (six) hours as needed. 04/03/20   Danford, Suann Larry, MD  insulin glargine (LANTUS) 100 UNIT/ML injection Inject 30 Units into the skin in the morning. Hold if BS is less than 150    [provider]  insulin lispro (HUMALOG) 100 UNIT/ML injection Inject 6 Units into the skin in the morning and at bedtime. Hold is BS <100    [provider]  lidocaine (LIDODERM) 5 % Place 1 patch onto the skin daily. Remove & Discard patch within 12 hours or as directed by MD    [provider]  metFORMIN (GLUCOPHAGE-XR) 500 MG 24 hr tablet Take 1,000 mg by mouth 2 (two) times daily. 07/31/17  [provider]  metoprolol tartrate (LOPRESSOR) 100 MG tablet Take 1 tablet (100 mg total) by mouth 3 (three) times daily. Patient taking differently: Take 100 mg by mouth 2 (two) times daily. 03/06/18   Barton Dubois, MD  Multiple Vitamins-Minerals (CERTAGEN PO) Take 1 tablet by mouth in the morning. For wound healing    [provider]  Multiple Vitamins-Minerals (PRESERVISION AREDS 2) CAPS Take 1 capsule by mouth daily.     [provider]  nitroGLYCERIN (NITROSTAT) 0.4 MG SL tablet Place 1 tablet (0.4 mg total) under the tongue every 5 (five) minutes as needed for chest pain (x 3 doses). 11/28/16   Lelon Perla, MD  omeprazole (PRILOSEC) 20 MG capsule Take 1 capsule by mouth daily. 02/12/18   [provider]  oxyCODONE (OXY IR/ROXICODONE) 5 MG immediate release tablet Take 1.5 tablets (7.5 mg total) by mouth every 6 (six) hours as needed (Pain). 04/03/20   Danford, Suann Larry, MD  predniSONE (DELTASONE) 5 MG tablet Take 5 mg by mouth daily.    [provider]   Rivaroxaban (XARELTO) 15 MG TABS tablet Take 1 tablet (15 mg total) by mouth daily. Patient taking differently: Take 10 mg by mouth daily. 07/28/17   Kathrene Alu, MD  senna (SENOKOT) 8.6 MG TABS tablet Take 1 tablet by mouth daily.    [provider]  spironolactone (ALDACTONE) 25 MG tablet Take 12.5 mg by mouth in the morning. 01/27/20   [provider]  VASCEPA 1 g CAPS Take 1 capsule by mouth every morning. 02/25/18   [provider]  VOLTAREN 1 % GEL Apply 2 g topically at bedtime as needed for pain. Knees, calf 04/27/17   [provider]  zinc sulfate 220 (50 Zn) MG capsule Take 220 mg by mouth daily.    [provider]  ferrous gluconate (FERGON) 324 MG tablet Take 1 tablet (324 mg total) by mouth 2 (two) times daily with a meal. Patient not taking: Reported on 03/30/2020 05/17/16 04/01/20  Debbe Odea, MD  potassium chloride SA (K-DUR,KLOR-CON) 20 MEQ tablet Take 1 tablet (20 mEq total) by mouth 2 (two) times daily. Patient not taking: Reported on 03/30/2020 10/10/17 04/01/20  Kathie Dike, MD     Family History  Problem Relation Age of Onset   Pneumonia Mother 18   Stroke Father 62   Heart failure Sister    Hypertension Sister    Heart attack Neg Hx     Social History   Socioeconomic History   Marital status: Divorced    Spouse name: Not on file   Number of children: Not on file   Years of education: Not on file   Highest education level: Not on file  Occupational History   Occupation: Retired  Tobacco Use   Smoking status: Never   Smokeless tobacco: Never  Vaping Use   Vaping Use: Never used  Substance and Sexual Activity   Alcohol use: No    Alcohol/week: 0.0 standard drinks   Drug use: No   Sexual activity: Not Currently  Other Topics Concern   Not on file  Social History Narrative   Not on file   Social Determinants of Health   Financial Resource Strain: Not on file  Food Insecurity: Not on file   Transportation Needs: Not on file  Physical Activity: Not on file  Stress: Not on file  Social Connections: Not on file    Review of Systems: A 12 point ROS discussed and  pertinent positives are indicated in the HPI above.  All other systems are negative.  Review of Systems  Unable to perform ROS: Mental status change   Vital Signs: BP (!) 149/78    Pulse (!) 123    Temp 97.6 F (36.4 C) (Oral)    Resp 14    Ht 5\' 7"  (1.702 m)    Wt 184 lb 1.4 oz (83.5 kg)    SpO2 97%    BMI 28.83 kg/m   Physical Exam Constitutional:      General: She is not in acute distress. HENT:     Mouth/Throat:     Mouth: Mucous membranes are moist.     Pharynx: Oropharynx is clear.  Cardiovascular:     Rate and Rhythm: Rhythm irregular.     Pulses: Normal pulses.     Heart sounds: Normal heart sounds.  Pulmonary:     Effort: Pulmonary effort is normal.     Breath sounds: Normal breath sounds.  Abdominal:     General: Bowel sounds are normal.     Palpations: Abdomen is soft.  Musculoskeletal:     Left lower leg: Edema present.     Comments: Scattered small ulcerations   Skin:    General: Skin is warm and dry.  Neurological:     Mental Status: She is alert. She is disoriented.    Imaging: CT Head Wo Contrast  Result Date: 05/21/2021 CLINICAL DATA:  Altered mental status, nausea/vomiting EXAM: CT HEAD WITHOUT CONTRAST TECHNIQUE: Contiguous axial images were obtained from the base of the skull through the vertex without intravenous contrast. RADIATION DOSE REDUCTION: This exam was performed according to the departmental dose-optimization program which includes automated exposure control, adjustment of the mA and/or kV according to patient size and/or use of iterative reconstruction technique. COMPARISON:  05/05/2021 FINDINGS: Brain: No evidence of acute infarction, hemorrhage, hydrocephalus, extra-axial collection or mass lesion/mass effect. Subcortical white matter and periventricular small vessel  ischemic changes. Encephalomalacic changes related to prior right temporo-occipital infarcts. Vascular: Intracranial atherosclerosis. Skull: Normal. Negative for fracture or focal lesion. Sinuses/Orbits: The visualized paranasal sinuses are essentially clear. The mastoid air cells are unopacified. Other: None. IMPRESSION: No evidence of acute intracranial abnormality. Small vessel ischemic changes. Old right sided infarcts. Electronically Signed   By: Julian Hy M.D.   On: 05/21/2021 01:06   CT Head Wo Contrast  Result Date: 05/05/2021 CLINICAL DATA:  Head trauma, minor (Age >= 65y); Neck trauma (Age >= 65y) EXAM: CT HEAD WITHOUT CONTRAST CT CERVICAL SPINE WITHOUT CONTRAST TECHNIQUE: Multidetector CT imaging of the head and cervical spine was performed following the standard protocol without intravenous contrast. Multiplanar CT image reconstructions of the cervical spine were also generated. RADIATION DOSE REDUCTION: This exam was performed according to the departmental dose-optimization program which includes automated exposure control, adjustment of the mA and/or kV according to patient size and/or use of iterative reconstruction technique. COMPARISON:  CT head 10/23/2010, CT head 07/24/2017 FINDINGS: CT HEAD FINDINGS BRAIN: BRAIN Encephalomalacia of the right temporoparietal and occipital lobes again noted. No evidence of large-territorial acute infarction. No parenchymal hemorrhage. No mass lesion. No extra-axial collection. No mass effect or midline shift. No hydrocephalus. Basilar cisterns are patent. Vascular: No hyperdense vessel. Skull: No acute fracture or focal lesion. Sinuses/Orbits: Paranasal sinuses and mastoid air cells are clear. Bilateral lens replacement. Otherwise the orbits are unremarkable. Other: None. CT CERVICAL SPINE FINDINGS Alignment: Normal. Skull base and vertebrae: Multilevel severe degenerative changes of the spine most prominent at  the C6-C7 levels. No acute fracture. No  aggressive appearing focal osseous lesion or focal pathologic process. Soft tissues and spinal canal: No prevertebral fluid or swelling. No visible canal hematoma. Upper chest: Unremarkable. Other: None. IMPRESSION: 1. No acute intracranial abnormality. 2. No acute displaced fracture or traumatic listhesis of the cervical spine. Electronically Signed   By: Iven Finn M.D.   On: 05/05/2021 22:48   CT Angio Chest PE W/Cm &/Or Wo Cm  Result Date: 05/21/2021 CLINICAL DATA:  Tachycardia, history of AFib. Sepsis. Nausea/vomiting. Abdominal pain. EXAM: CT ANGIOGRAPHY CHEST CT ABDOMEN AND PELVIS WITH CONTRAST TECHNIQUE: Multidetector CT imaging of the chest was performed using the standard protocol during bolus administration of intravenous contrast. Multiplanar CT image reconstructions and MIPs were obtained to evaluate the vascular anatomy. Multidetector CT imaging of the abdomen and pelvis was performed using the standard protocol during bolus administration of intravenous contrast. RADIATION DOSE REDUCTION: This exam was performed according to the departmental dose-optimization program which includes automated exposure control, adjustment of the mA and/or kV according to patient size and/or use of iterative reconstruction technique. CONTRAST:  11mL OMNIPAQUE IOHEXOL 350 MG/ML SOLN COMPARISON:  CTA chest dated 10/08/2014. FINDINGS: CTA CHEST FINDINGS Cardiovascular: Satisfactory opacification of the bilateral pulmonary arteries to the lobar level. No evidence of pulmonary embolism. Study is not tailored for evaluation of the thoracic aorta. No evidence of thoracic aortic aneurysm. Atherosclerotic calcifications of the aortic arch. Cardiomegaly.  No pericardial effusion. Coronary atherosclerosis of the LAD and right coronary artery. Mediastinum/Nodes: No suspicious mediastinal lymphadenopathy. Visualized thyroid is unremarkable. Lungs/Pleura: Mild interstitial edema with small right and trace left pleural  effusions. Associated bilateral lower lobe opacities, likely atelectasis. Evaluation of the lung parenchyma is constrained by respiratory motion. Within that constraint, there are no suspicious pulmonary nodules. No pneumothorax. Musculoskeletal: Mild degenerative changes of the mid/lower thoracic spine. Review of the MIP images confirms the above findings. CT ABDOMEN and PELVIS FINDINGS Motion degraded images. Hepatobiliary: Liver is within normal limits. Gallbladder is unremarkable. No intrahepatic or extrahepatic ductal dilatation. Pancreas: Within normal limits. Spleen: Within normal limits. Adrenals/Urinary Tract: Adrenal glands are within normal limits. 3.0 cm hyperdense/hemorrhagic right upper pole renal cyst (series 13/image 37). Additional 13 mm posterior right lower pole renal cyst (series 13/image 42), simple. Left kidney is within normal limits. No hydronephrosis. Bladder is within normal limits. Stomach/Bowel: Stomach is within normal limits. No evidence of bowel obstruction. Normal appendix (series 13/image 99). Left colon is decompressed scattered mild colonic diverticulosis. Vascular/Lymphatic: No evidence of abdominal aortic aneurysm. Atherosclerotic calcifications of the abdominal aorta and branch vessels. Small retroperitoneal lymph nodes which do not meet pathologic CT size criteria. Reproductive: Uterine fibroid. Left ovary is within normal limits.  No right adnexal mass. Other: No abdominopelvic ascites. Musculoskeletal: 2.7 x 3.4 x 4.2 cm fluid collection in the left iliopsoas muscle (series 13/image 78), partially obscured by streak artifact. Degenerative changes of the lumbar spine. Bilateral hip arthroplasties, without evidence of complication. Review of the MIP images confirms the above findings. IMPRESSION: No evidence of pulmonary embolism. Cardiomegaly with mild interstitial edema and small bilateral pleural effusions, right greater than left. 4.2 cm intramuscular fluid collection the  left iliopsoas muscle, partially obscured by streak artifact. Uterine fibroids. Electronically Signed   By: Julian Hy M.D.   On: 05/21/2021 01:14   CT Cervical Spine Wo Contrast  Result Date: 05/05/2021 CLINICAL DATA:  Head trauma, minor (Age >= 65y); Neck trauma (Age >= 65y) EXAM: CT HEAD WITHOUT CONTRAST CT  CERVICAL SPINE WITHOUT CONTRAST TECHNIQUE: Multidetector CT imaging of the head and cervical spine was performed following the standard protocol without intravenous contrast. Multiplanar CT image reconstructions of the cervical spine were also generated. RADIATION DOSE REDUCTION: This exam was performed according to the departmental dose-optimization program which includes automated exposure control, adjustment of the mA and/or kV according to patient size and/or use of iterative reconstruction technique. COMPARISON:  CT head 10/23/2010, CT head 07/24/2017 FINDINGS: CT HEAD FINDINGS BRAIN: BRAIN Encephalomalacia of the right temporoparietal and occipital lobes again noted. No evidence of large-territorial acute infarction. No parenchymal hemorrhage. No mass lesion. No extra-axial collection. No mass effect or midline shift. No hydrocephalus. Basilar cisterns are patent. Vascular: No hyperdense vessel. Skull: No acute fracture or focal lesion. Sinuses/Orbits: Paranasal sinuses and mastoid air cells are clear. Bilateral lens replacement. Otherwise the orbits are unremarkable. Other: None. CT CERVICAL SPINE FINDINGS Alignment: Normal. Skull base and vertebrae: Multilevel severe degenerative changes of the spine most prominent at the C6-C7 levels. No acute fracture. No aggressive appearing focal osseous lesion or focal pathologic process. Soft tissues and spinal canal: No prevertebral fluid or swelling. No visible canal hematoma. Upper chest: Unremarkable. Other: None. IMPRESSION: 1. No acute intracranial abnormality. 2. No acute displaced fracture or traumatic listhesis of the cervical spine.  Electronically Signed   By: Iven Finn M.D.   On: 05/05/2021 22:48   CT Abdomen Pelvis W Contrast  Result Date: 05/21/2021 CLINICAL DATA:  Tachycardia, history of AFib. Sepsis. Nausea/vomiting. Abdominal pain. EXAM: CT ANGIOGRAPHY CHEST CT ABDOMEN AND PELVIS WITH CONTRAST TECHNIQUE: Multidetector CT imaging of the chest was performed using the standard protocol during bolus administration of intravenous contrast. Multiplanar CT image reconstructions and MIPs were obtained to evaluate the vascular anatomy. Multidetector CT imaging of the abdomen and pelvis was performed using the standard protocol during bolus administration of intravenous contrast. RADIATION DOSE REDUCTION: This exam was performed according to the departmental dose-optimization program which includes automated exposure control, adjustment of the mA and/or kV according to patient size and/or use of iterative reconstruction technique. CONTRAST:  13mL OMNIPAQUE IOHEXOL 350 MG/ML SOLN COMPARISON:  CTA chest dated 10/08/2014. FINDINGS: CTA CHEST FINDINGS Cardiovascular: Satisfactory opacification of the bilateral pulmonary arteries to the lobar level. No evidence of pulmonary embolism. Study is not tailored for evaluation of the thoracic aorta. No evidence of thoracic aortic aneurysm. Atherosclerotic calcifications of the aortic arch. Cardiomegaly.  No pericardial effusion. Coronary atherosclerosis of the LAD and right coronary artery. Mediastinum/Nodes: No suspicious mediastinal lymphadenopathy. Visualized thyroid is unremarkable. Lungs/Pleura: Mild interstitial edema with small right and trace left pleural effusions. Associated bilateral lower lobe opacities, likely atelectasis. Evaluation of the lung parenchyma is constrained by respiratory motion. Within that constraint, there are no suspicious pulmonary nodules. No pneumothorax. Musculoskeletal: Mild degenerative changes of the mid/lower thoracic spine. Review of the MIP images confirms  the above findings. CT ABDOMEN and PELVIS FINDINGS Motion degraded images. Hepatobiliary: Liver is within normal limits. Gallbladder is unremarkable. No intrahepatic or extrahepatic ductal dilatation. Pancreas: Within normal limits. Spleen: Within normal limits. Adrenals/Urinary Tract: Adrenal glands are within normal limits. 3.0 cm hyperdense/hemorrhagic right upper pole renal cyst (series 13/image 37). Additional 13 mm posterior right lower pole renal cyst (series 13/image 42), simple. Left kidney is within normal limits. No hydronephrosis. Bladder is within normal limits. Stomach/Bowel: Stomach is within normal limits. No evidence of bowel obstruction. Normal appendix (series 13/image 99). Left colon is decompressed scattered mild colonic diverticulosis. Vascular/Lymphatic: No evidence of abdominal aortic  aneurysm. Atherosclerotic calcifications of the abdominal aorta and branch vessels. Small retroperitoneal lymph nodes which do not meet pathologic CT size criteria. Reproductive: Uterine fibroid. Left ovary is within normal limits.  No right adnexal mass. Other: No abdominopelvic ascites. Musculoskeletal: 2.7 x 3.4 x 4.2 cm fluid collection in the left iliopsoas muscle (series 13/image 78), partially obscured by streak artifact. Degenerative changes of the lumbar spine. Bilateral hip arthroplasties, without evidence of complication. Review of the MIP images confirms the above findings. IMPRESSION: No evidence of pulmonary embolism. Cardiomegaly with mild interstitial edema and small bilateral pleural effusions, right greater than left. 4.2 cm intramuscular fluid collection the left iliopsoas muscle, partially obscured by streak artifact. Uterine fibroids. Electronically Signed   By: Julian Hy M.D.   On: 05/21/2021 01:14   Portable chest 1 View  Result Date: 05/21/2021 CLINICAL DATA:  76 year old female with altered mental status. Tachycardia and confusion. Atrial fibrillation with RVR. EXAM: PORTABLE  CHEST 1 VIEW COMPARISON:  CTA chest 0046 hours today. FINDINGS: Portable AP upright view at 0537 hours. Right greater than left small pleural effusions better demonstrated by CTA. Pulmonary vascular congestion appears mildly improved from portable chest yesterday. No pneumothorax. Stable cardiac size and mediastinal contours. No consolidation. Visualized tracheal air column is within normal limits. No acute osseous abnormality identified. Negative visible bowel gas. IMPRESSION: Slightly improved pulmonary edema since yesterday. Small right greater than left pleural effusions better demonstrated by CTA earlier today. Electronically Signed   By: Genevie Ann M.D.   On: 05/21/2021 05:54   DG Chest Port 1 View  Result Date: 05/20/2021 CLINICAL DATA:  Question sepsis. Nausea, vomiting and altered mental status. EXAM: PORTABLE CHEST 1 VIEW COMPARISON:  04/03/2020.  03/31/2020. FINDINGS: The heart is enlarged. There are prominent interstitial markings, probably with mild interstitial edema. No infiltrate, collapse or visible effusion. IMPRESSION: Interstitial edema pattern. No focal infiltrate, collapse or visible effusion. Electronically Signed   By: [name] Chimes M.D.   On: 05/20/2021 22:43   ECHOCARDIOGRAM COMPLETE  Result Date: 05/21/2021    ECHOCARDIOGRAM REPORT   Patient Name:   [name] Date of Exam: 05/21/2021 Medical Rec #:  [id number]      Height:       67.0 in Accession #:    [id number]     Weight:       184.1 lb Date of Birth:  March 28, 1946      BSA:          1.952 m Patient Age:    23 years       BP:           84/69 mmHg Patient Gender: F              HR:           107 bpm. Exam Location:  [name] Procedure: 2D Echo, Cardiac Doppler and Color Doppler Indications:    CHF  History:        Patient has prior history of Echocardiogram examinations, most                 recent 04/01/2020. CHF, Stroke, Arrythmias:Atrial Fibrillation,                 Signs/Symptoms:Chest Pain; Risk Factors:Hypertension,  Diabetes                 and Dyslipidemia.  Sonographer:    Wenda Low Referring Phys: [id number] ASIA B Rothschild  Sonographer Comments: No subcostal window. Image acquisition  challenging due to patient behavioral factors. IMPRESSIONS  1. Left ventricular ejection fraction, by estimation, is 65 to 70%. The left ventricle has normal function. The left ventricle has no regional wall motion abnormalities. There is mild left ventricular hypertrophy. Left ventricular diastolic parameters are indeterminate.  2. Right ventricular systolic function is normal. The right ventricular size is normal. There is moderately elevated pulmonary artery systolic pressure.  3. The mitral valve is normal in structure. Trivial mitral valve regurgitation.  4. The aortic valve is tricuspid. Aortic valve regurgitation is not visualized. Comparison(s): The left ventricular function is unchanged. FINDINGS  Left Ventricle: Left ventricular ejection fraction, by estimation, is 65 to 70%. The left ventricle has normal function. The left ventricle has no regional wall motion abnormalities. The left ventricular internal cavity size was normal in size. There is  mild left ventricular hypertrophy. Left ventricular diastolic parameters are indeterminate. Right Ventricle: The right ventricular size is normal. Right vetricular wall thickness was not assessed. Right ventricular systolic function is normal. There is moderately elevated pulmonary artery systolic pressure. The tricuspid regurgitant velocity is  3.15 m/s, and with an assumed right atrial pressure of 8 mmHg, the estimated right ventricular systolic pressure is 34.7 mmHg. Left Atrium: Left atrial size was normal in size. Right Atrium: Right atrial size was normal in size. Pericardium: There is no evidence of pericardial effusion. Mitral Valve: The mitral valve is normal in structure. There is mild thickening of the mitral valve leaflet(s). Mild to moderate mitral annular calcification.  Trivial mitral valve regurgitation. MV peak gradient, 14.0 mmHg. The mean mitral valve gradient  is 6.0 mmHg. Tricuspid Valve: The tricuspid valve is normal in structure. Tricuspid valve regurgitation is mild. Aortic Valve: The aortic valve is tricuspid. Aortic valve regurgitation is not visualized. Aortic valve mean gradient measures 4.0 mmHg. Aortic valve peak gradient measures 6.7 mmHg. Aortic valve area, by VTI measures 2.28 cm. Pulmonic Valve: The pulmonic valve was not well visualized. Pulmonic valve regurgitation is not visualized. No evidence of pulmonic stenosis. Aorta: The aortic root and ascending aorta are structurally normal, with no evidence of dilitation. IAS/Shunts: No atrial level shunt detected by color flow Doppler.  LEFT VENTRICLE PLAX 2D LVIDd:         4.00 cm   Diastology LVIDs:         2.50 cm   LV e' lateral:   9.57 cm/s LV PW:         1.40 cm   LV E/e' lateral: 18.3 LV IVS:        1.40 cm LVOT diam:     1.90 cm LV SV:         47 LV SV Index:   24 LVOT Area:     2.84 cm  RIGHT VENTRICLE RV Basal diam:  3.80 cm RV Mid diam:    2.50 cm RV S prime:     8.05 cm/s LEFT ATRIUM             Index        RIGHT ATRIUM           Index LA diam:        4.70 cm 2.41 cm/m   RA Area:     19.70 cm LA Vol (A2C):   61.6 ml 31.55 ml/m  RA Volume:   53.10 ml  27.20 ml/m LA Vol (A4C):   54.5 ml 27.92 ml/m LA Biplane Vol: 60.4 ml 30.94 ml/m  AORTIC VALVE  PULMONIC VALVE AV Area (Vmax):    2.17 cm     PV Vmax:       0.50 m/s AV Area (Vmean):   2.14 cm     PV Peak grad:  1.0 mmHg AV Area (VTI):     2.28 cm AV Vmax:           129.00 cm/s AV Vmean:          86.100 cm/s AV VTI:            0.208 m AV Peak Grad:      6.7 mmHg AV Mean Grad:      4.0 mmHg LVOT Vmax:         98.80 cm/s LVOT Vmean:        65.000 cm/s LVOT VTI:          0.167 m LVOT/AV VTI ratio: 0.80  AORTA Ao Root diam: 2.70 cm Ao Asc diam:  3.30 cm MITRAL VALVE                TRICUSPID VALVE MV Area (PHT): 4.68 cm     TR Peak  grad:   39.7 mmHg MV Area VTI:   1.53 cm     TR Vmax:        315.00 cm/s MV Peak grad:  14.0 mmHg MV Mean grad:  6.0 mmHg     SHUNTS MV Vmax:       1.87 m/s     Systemic VTI:  0.17 m MV Vmean:      104.0 cm/s   Systemic Diam: 1.90 cm MV Decel Time: 162 msec MV E velocity: 175.00 cm/s Dorris Carnes MD Electronically signed by Dorris Carnes MD Signature Date/Time: 05/21/2021/5:43:55 PM    Final     Labs:  CBC: Recent Labs    05/20/21 2212 05/21/21 0541 05/22/21 0419  WBC 21.2* 11.6* 16.5*  HGB 12.7 10.9* 10.5*  HCT 43.1 35.8* 35.2*  PLT 303 234 252    COAGS: Recent Labs    05/20/21 2212 05/21/21 0541  INR 1.8* 1.8*  APTT 35  --     BMP: Recent Labs    05/20/21 2212 05/21/21 0541 05/22/21 0419  [name] 140 139 143  K 3.2* 3.7 3.0*  CL 99 103 102  CO2 22 23 26   GLUCOSE 75 96 118*  BUN 21 20 25*  CALCIUM 8.4* 7.7* 7.8*  CREATININE 1.05* 1.02* 0.86  GFRNONAA 55* 57* >60    LIVER FUNCTION TESTS: Recent Labs    05/20/21 2212 05/21/21 0541  BILITOT 1.4* 0.9  AST 54* 285*  ALT 19 78*  ALKPHOS 149* 106  PROT 7.1 5.4*  ALBUMIN 3.4* 2.5*    TUMOR MARKERS: No results for input(s): AFPTM, CEA, CA199, CHROMGRNA in the last 8760 hours.  Assessment and Plan:  Left iliopsoas fluid collection: [name], 76 year old female, presents today to the [facility] Interventional Radiology department for an image-guided left iliopsoas fluid collection aspiration with possible drain placement. She was transported to [facility] via [place] and will return to [facility] once the procedure/post-procedure recovery is complete. Telephone consent obtained from the patient's son. Xarelto has been held for 24 hours.   Risks and benefits discussed with the patient including bleeding, infection, damage to adjacent structures, bowel perforation/fistula connection, and sepsis.  All of the patient's questions were answered, patient is agreeable to proceed.  Consent signed and in chart.  Thank you for  this interesting consult.  I greatly enjoyed meeting  [name] and look forward to participating in their care.  A copy of this report was sent to the requesting provider on this date.  Electronically Signed: Soyla Dryer, AGACNP-BC [phone] 05/22/2021, 9:45 AM   I spent a total of 20 Minutes    in face to face in clinical consultation, greater than 50% of which was counseling/coordinating care for left iliopsoas fluid collection aspiration with possible drain placement.

## 2021-05-22 NOTE — Sedation Documentation (Signed)
[facility] arrived to transport pt to [facility]

## 2021-05-22 NOTE — Procedures (Addendum)
Interventional Radiology Procedure:   Indications: Bacteremia with left iliopsoas fluid collection  Procedure: CT guided aspiration and drain placement.  Drain removed at end of procedure.   Findings: 10 Fr drain placed in left iliopsoas collection.  Very difficult to remove any fluid.  Eventually 13 ml of yellow viscous fluid was removed and suggestive for synovial fluid.  Complications: No immediate complications noted.     EBL: Minimal  Plan: Left iliopsoas collection appears to represent bursa fluid based on fluid consistency.  Will send fluid for crystals and cultures.  Did not leave drain because the fluid was barely draining and concerned that a drain could potentially infect the collection if the fluid is sterile.     Tracey Hermance R. Anselm Pancoast, MD  Pager: [phone]

## 2021-05-22 NOTE — Progress Notes (Signed)
[facility] present to transport patient to [facility] for IR procedure.

## 2021-05-22 NOTE — Progress Notes (Signed)
Cardizem gtt stopped due to resolving HR and soft BP's. MD made aware. Will continue to monitor.

## 2021-05-23 ENCOUNTER — Other Ambulatory Visit: Payer: Self-pay

## 2021-05-23 LAB — CULTURE, BLOOD (ROUTINE X 2): Special Requests: ADEQUATE

## 2021-05-23 LAB — BASIC METABOLIC PANEL
Anion gap: 9 (ref 5–15)
BUN: 30 mg/dL — ABNORMAL HIGH (ref 8–23)
CO2: 26 mmol/L (ref 22–32)
Calcium: 7.6 mg/dL — ABNORMAL LOW (ref 8.9–10.3)
Chloride: 104 mmol/L (ref 98–111)
Creatinine, Ser: 1.12 mg/dL — ABNORMAL HIGH (ref 0.44–1.00)
GFR, Estimated: 51 mL/min — ABNORMAL LOW (ref >60)
Glucose, Bld: 92 mg/dL (ref 70–99)
Potassium: 3.4 mmol/L — ABNORMAL LOW (ref 3.5–5.1)
Sodium: 139 mmol/L (ref 135–145)

## 2021-05-23 LAB — GLUCOSE, CAPILLARY
Glucose-Capillary: 100 mg/dL — ABNORMAL HIGH (ref 70–99)
Glucose-Capillary: 183 mg/dL — ABNORMAL HIGH (ref 70–99)
Glucose-Capillary: 88 mg/dL (ref 70–99)
Glucose-Capillary: 99 mg/dL (ref 70–99)

## 2021-05-23 LAB — CBC
HCT: 33.1 % — ABNORMAL LOW (ref 36.0–46.0)
Hemoglobin: 10.2 g/dL — ABNORMAL LOW (ref 12.0–15.0)
MCH: 26.8 pg (ref 26.0–34.0)
MCHC: 30.8 g/dL (ref 30.0–36.0)
MCV: 86.9 fL (ref 80.0–100.0)
Platelets: 222 10*3/uL (ref 150–400)
RBC: 3.81 MIL/uL — ABNORMAL LOW (ref 3.87–5.11)
RDW: 19.1 % — ABNORMAL HIGH (ref 11.5–15.5)
WBC: 16.2 10*3/uL — ABNORMAL HIGH (ref 4.0–10.5)
nRBC: 0 % (ref 0.0–0.2)

## 2021-05-23 MED ORDER — CHLORHEXIDINE GLUCONATE CLOTH 2 % EX PADS
6.0000 | MEDICATED_PAD | Freq: Every day | CUTANEOUS | Status: DC
  Administered 2021-05-23 – 2021-05-25 (×3): 6 via TOPICAL

## 2021-05-23 MED ORDER — PREDNISONE 10 MG PO TABS
5.0000 mg | ORAL_TABLET | Freq: Every day | ORAL | Status: DC
Start: 2021-05-24 — End: 2021-05-25
  Administered 2021-05-24 – 2021-05-25 (×2): 5 mg via ORAL
  Filled 2021-05-23 (×2): qty 1

## 2021-05-23 MED ORDER — POTASSIUM CHLORIDE CRYS ER 20 MEQ PO TBCR
40.0000 meq | EXTENDED_RELEASE_TABLET | Freq: Once | ORAL | Status: AC
  Administered 2021-05-23: 40 meq via ORAL
  Filled 2021-05-23: qty 2

## 2021-05-23 NOTE — Progress Notes (Signed)
Report called and given to Apolonio Schneiders, RN on Dept. 300. Pt to be transported via bed to room 329.

## 2021-05-24 DIAGNOSIS — L89302 Pressure ulcer of unspecified buttock, stage 2: Secondary | ICD-10-CM

## 2021-05-24 DIAGNOSIS — R7881 Bacteremia: Secondary | ICD-10-CM

## 2021-05-24 DIAGNOSIS — I4891 Unspecified atrial fibrillation: Secondary | ICD-10-CM

## 2021-05-24 DIAGNOSIS — B955 Unspecified streptococcus as the cause of diseases classified elsewhere: Secondary | ICD-10-CM

## 2021-05-24 LAB — GLUCOSE, CAPILLARY
Glucose-Capillary: 66 mg/dL — ABNORMAL LOW (ref 70–99)
Glucose-Capillary: 88 mg/dL (ref 70–99)
Glucose-Capillary: 114 mg/dL — ABNORMAL HIGH (ref 70–99)
Glucose-Capillary: 134 mg/dL — ABNORMAL HIGH (ref 70–99)
Glucose-Capillary: 101 mg/dL — ABNORMAL HIGH (ref 70–99)

## 2021-05-24 LAB — CREATININE, SERUM
Creatinine, Ser: 0.99 mg/dL (ref 0.44–1.00)
GFR, Estimated: 59 mL/min — ABNORMAL LOW (ref >60)

## 2021-05-24 MED ORDER — INSULIN DETEMIR 100 UNIT/ML ~~LOC~~ SOLN
8.0000 [IU] | Freq: Every day | SUBCUTANEOUS | Status: DC
  Filled 2021-05-24 (×2): qty 0.08

## 2021-05-24 NOTE — NC FL2 (Signed)
[place] MEDICAID FL2 LEVEL OF CARE SCREENING TOOL     IDENTIFICATION  Patient Name: [name] Birthdate: May 29, 1945 Sex: female Admission Date (Current Location): 05/20/2021  [facility] and [place] Number:  [employer] and Address:  [place] [address] [place]      Provider Number: [id number]  Attending Physician Name and Address:  Barton Dubois, MD  Relative Name and Phone Number:       Current Level of Care: Hospital Recommended Level of Care: [place] Prior Approval Number:    Date Approved/Denied:   PASRR Number:    Discharge Plan: SNF    Current Diagnoses: Patient Active Problem List   Diagnosis Date Noted   Hypokalemia 92/02/9416   Acute metabolic encephalopathy 40/81/4481   Pressure injury of skin 05/21/2021   Sepsis due to undetermined organism ([place]) 03/30/2020   Hypomagnesemia 03/30/2020   Hypophosphatemia 03/30/2020   Hyperglycemia    Iliopsoas abscess on left ([place]) 10/04/2017   HCAP (healthcare-associated pneumonia) 10/04/2017   Acute lower UTI 10/04/2017   Hallucinations 07/25/2017   Abnormal LFTs 07/24/2017   Acute on chronic diastolic CHF (congestive heart failure) (HCC)    Elevated LFTs    AKI (acute kidney injury) ([place])    Elevated troponin    Stroke, acute, embolic ([place]) 85/63/1497   Mitral regurgitation 06/17/2017   Acute CVA (cerebrovascular accident) ([place])    Thyroid nodule 06/15/2017   Atrial fibrillation with RVR ([place]) 06/14/2017   CVA (cerebral vascular accident) ([place]) 06/14/2017   Type II diabetes mellitus (HCC)    Peripheral vascular disease (HCC)    Hyperlipidemia    Hiatal hernia    Edema    Chronic diastolic (congestive) heart failure (HCC)    Carpal tunnel syndrome    Chronic atrial fibrillation with RVR (HCC)    Anemia    Actinic keratosis    Intractable nausea and vomiting 05/31/2016   Nausea & vomiting 05/30/2016   Abnormal chest x-ray 05/30/2016    Influenza A 05/21/2016   Acute CHF (congestive heart failure) ([place]) 05/20/2016   History of GI bleed 03/29/2016   Chronic anticoagulation 03/29/2016   Symptomatic anemia 03/19/2016   Spinal stenosis of lumbar region 02/10/2016   Varicose veins of left leg with edema 09/15/2015   Preop cardiovascular exam 08/25/2015   Iron deficiency anemia due to chronic blood loss 07/17/2015   Anemia of chronic renal failure, stage 3 (moderate) (HCC) 07/17/2015   Anticoagulant causing adverse effect in therapeutic use 07/17/2015   Symptomatic bradycardia 02/21/2015   Bradycardia    Acute deep vein thrombosis (DVT) of left lower extremity ([place]) 05/06/2014   Abdominal wall mass of right lower quadrant 07/08/2013   OA (osteoarthritis) of hip 04/03/2013   Osteoarthritis of hip 01/18/2013   Night sweats 09/06/2012   Chest pain    Anxiety and depression    Gastroesophageal reflux disease    Hepatitis    Jaundice    Hypertension 07/01/2010   Hypercholesterolemia 07/01/2010   Type 2 diabetes mellitus with hyperlipidemia ([place]) 07/01/2010   Rheumatoid arthritis ([place]) 07/01/2010   Sinus tachycardia 07/01/2010   Chest pain, atypical 07/01/2010    Orientation RESPIRATION BLADDER Height & Weight     Self, Time, Situation, Place  Normal Continent Weight: 184 lb 1.4 oz (83.5 kg) Height:  5\' 7"  (170.2 cm)  BEHAVIORAL SYMPTOMS/MOOD NEUROLOGICAL BOWEL NUTRITION STATUS      Continent  (see dc summary)  AMBULATORY STATUS COMMUNICATION OF NEEDS Skin  Limited Assist Verbally PU Stage and Appropriate Care, Other (Comment) (R Buttock, also see dc summary and WOC consult)   PU Stage 2 Dressing:  (see dc summary and WOC consult)                   Personal Care Assistance Level of Assistance  Bathing, Feeding, Dressing Bathing Assistance: Limited assistance Feeding assistance: Independent Dressing Assistance: Limited assistance     Functional Limitations Info  Sight, Hearing, Speech Sight Info:  Adequate Hearing Info: Impaired Speech Info: Adequate    SPECIAL CARE FACTORS FREQUENCY                       Contractures Contractures Info: Not present    Additional Factors Info  Code Status, Allergies Code Status Info: DNR Allergies Info: Promethazine           Current Medications (05/24/2021):  This is the current hospital active medication list Current Facility-Administered Medications  Medication Dose Route Frequency Provider Last Rate Last Admin   (feeding supplement) PROSource Plus liquid 30 mL  30 mL Oral Daily Zierle-Ghosh, Asia B, DO   30 mL at 05/24/21 0851   acetaminophen (TYLENOL) tablet 650 mg  650 mg Oral Q6H PRN Zierle-Ghosh, Asia B, DO       Or   acetaminophen (TYLENOL) suppository 650 mg  650 mg Rectal Q6H PRN Zierle-Ghosh, Asia B, DO   650 mg at 05/21/21 0335   albuterol (PROVENTIL) (2.5 MG/3ML) 0.083% nebulizer solution 2.5 mg  2.5 mg Nebulization Q4H PRN Zierle-Ghosh, Asia B, DO       atorvastatin (LIPITOR) tablet 20 mg  20 mg Oral Daily Zierle-Ghosh, Asia B, DO   20 mg at 05/24/21 0849   buPROPion ER (WELLBUTRIN SR) 12 hr tablet 100 mg  100 mg Oral BID Zierle-Ghosh, Asia B, DO   100 mg at 05/24/21 0848   cefTRIAXone (ROCEPHIN) 2 g in sodium chloride 0.9 % 100 mL IVPB  2 g Intravenous Q24H Tawni Millers, MD 200 mL/hr at 05/24/21 0042 Infusion Verify at 05/24/21 0042   Chlorhexidine Gluconate Cloth 2 % PADS 6 each  6 each Topical Q0600 Zierle-Ghosh, Asia B, DO   6 each at 05/23/21 0801   diltiazem (CARDIZEM CD) 24 hr capsule 240 mg  240 mg Oral Daily Zierle-Ghosh, Asia B, DO   240 mg at 05/24/21 0850   insulin aspart (novoLOG) injection 0-15 Units  0-15 Units Subcutaneous TID WC Zierle-Ghosh, Asia B, DO   3 Units at 05/23/21 1133   insulin aspart (novoLOG) injection 0-5 Units  0-5 Units Subcutaneous QHS Zierle-Ghosh, Asia B, DO       insulin detemir (LEVEMIR) injection 8 Units  8 Units Subcutaneous QHS Barton Dubois, MD       MEDLINE mouth  rinse  15 mL Mouth Rinse BID Arrien, Jimmy Picket, MD   15 mL at 05/23/21 2218   metoprolol tartrate (LOPRESSOR) tablet 100 mg  100 mg Oral BID Zierle-Ghosh, Asia B, DO   100 mg at 05/24/21 0848   mometasone-formoterol (DULERA) 200-5 MCG/ACT inhaler 2 puff  2 puff Inhalation BID Zierle-Ghosh, Asia B, DO   2 puff at 05/24/21 5053   multivitamin with minerals tablet 1 tablet  1 tablet Oral Daily Zierle-Ghosh, Asia B, DO   1 tablet at 05/24/21 0850   omega-3 acid ethyl esters (LOVAZA) capsule 1 g  1 g Oral Daily Arrien, Jimmy Picket, MD   1 g at 05/24/21 [phone]  ondansetron (ZOFRAN) tablet 4 mg  4 mg Oral Q6H PRN Zierle-Ghosh, Asia B, DO       Or   ondansetron (ZOFRAN) injection 4 mg  4 mg Intravenous Q6H PRN Zierle-Ghosh, Asia B, DO       oxyCODONE (Oxy IR/ROXICODONE) immediate release tablet 5 mg  5 mg Oral Q4H PRN Zierle-Ghosh, Asia B, DO   5 mg at 05/24/21 3383   pantoprazole (PROTONIX) EC tablet 40 mg  40 mg Oral Daily Zierle-Ghosh, Asia B, DO   40 mg at 05/24/21 0848   predniSONE (DELTASONE) tablet 5 mg  5 mg Oral Q breakfast Arrien, Jimmy Picket, MD   5 mg at 05/24/21 2919   senna (SENOKOT) tablet 8.6 mg  1 tablet Oral Daily Zierle-Ghosh, Asia B, DO   8.6 mg at 05/24/21 1660     Discharge Medications: Please see discharge summary for a list of discharge medications.  Relevant Imaging Results:  Relevant Lab Results:   Additional Information    Shade Flood, LCSW

## 2021-05-24 NOTE — Progress Notes (Signed)
°   05/24/21 1235  Assess: MEWS Score  BP 140/80  Pulse Rate (!) 51  Resp 18  SpO2 94 %  O2 Device Room Air  Assess: MEWS Score  MEWS Temp 0  MEWS Systolic 0  MEWS Pulse 0  MEWS RR 0  MEWS LOC 0  MEWS Score 0  MEWS Score Color Green  Assess: if the MEWS score is Yellow or Red  Were vital signs taken at a resting state? Yes  Focused Assessment No change from prior assessment  Early Detection of Sepsis Score *See Row Information* Low  Treat  MEWS Interventions Administered scheduled meds/treatments  Pain Scale 0-10  Pain Score 0  Patients Stated Pain Goal 0  Take Vital Signs  Increase Vital Sign Frequency  Yellow: Q 2hr X 2 then Q 4hr X 2, if remains yellow, continue Q 4hrs

## 2021-05-24 NOTE — TOC Progression Note (Signed)
Transition of Care [facility]) - Progression Note    Patient Details  Name: NEDA WILLENBRING MRN: [id number] Date of Birth: 12/20/1945  Transition of Care [facility] CM/SW Contact  Shade Flood, LCSW Phone Number: 05/24/2021, 10:50 AM  Clinical Narrative:     TOC following. Per MD, pt may be medically ready for dc back to [facility]. Spoke with Melissa at [facility] to update. Per Lenna Sciara, they can take pt back tomorrow. Melissa states that at baseline, pt can transfer to her scooter and she uses that to mobilize around the facility. Will update PT.  Will follow up tomorrow.  Expected Discharge Plan: Long Term Nursing Home Barriers to Discharge: Continued Medical Work up  Expected Discharge Plan and Services Expected Discharge Plan: [place] Acute Care Choice: [place], Resumption of Svcs/PTA Provider Living arrangements for the past 2 months: [place]                                       Social Determinants of Health (SDOH) Interventions    Readmission Risk Interventions Readmission Risk Prevention Plan 05/21/2021 04/01/2020  Transportation Screening Complete Complete  PCP or Specialist Appt within 3-5 Days - Not Complete  Not Complete comments - Patient's PCP appointments are set up at her facility  [place] or [place] - Complete  Social Work Consult for [place] Planning/Counseling - Complete  Palliative Care Screening - Complete  Medication Review Press photographer) Complete Complete  SW Recovery Care/Counseling Consult Complete -  [place] Not Applicable -  Some recent data might be hidden

## 2021-05-24 NOTE — Plan of Care (Signed)
°  Problem: Acute Rehab PT Goals(only PT should resolve) Goal: Pt Will Go Supine/Side To Sit Outcome: Progressing Flowsheets (Taken 05/24/2021 1647) Pt will go Supine/Side to Sit:  with modified independence  with supervision Goal: Patient Will Transfer Sit To/From Stand Outcome: Progressing Flowsheets (Taken 05/24/2021 1647) Patient will transfer sit to/from stand:  with modified independence  with supervision Goal: Pt Will Transfer Bed To Chair/Chair To Bed Outcome: Progressing Flowsheets (Taken 05/24/2021 1647) Pt will Transfer Bed to Chair/Chair to Bed:  min guard assist  with min assist Goal: Pt Will Ambulate Outcome: Progressing Flowsheets (Taken 05/24/2021 1647) Pt will Ambulate:  15 feet  with minimal assist  with moderate assist  with rolling walker   4:47 PM, 05/24/21 [name], MPT Physical Therapist with [facility] [phone] office [phone] mobile phone

## 2021-05-24 NOTE — Progress Notes (Signed)
Progress Note   Patient: [name] HQP:[id number] DOB: [birth year] DOA: 05/20/2021     3 DOS: the patient was seen and examined on 05/24/2021   Brief hospital course: [name] was admitted to the hospital with the working diagnosis of sepsis due to left iliopsoas abscess, complicated with atrial fibrillation with RVR and hypoxemic respiratory failure.   76 yo female with the past medical history of anemia, atrial fibrillation, heart failure, dyslipidemia, hypertension and T2DM who presented with altered mental status. Patient no able to provide detailed history due to cognitive dysfunction. Apparently she had nausea, vomiting and altered mentation, that prompted her transfer from SNF to the ED. On her initial physical examination her BP 90/66, HR 110 to 109, RR 21 Temp 102. and oxygen saturation 98%, patient was confused oriented x1, lungs clear to auscultation, heart with S1 and S2 present with irregularly irregular rhythm, with no gallops, abdomen soft and non tender. No lower extremity edema. Pressure ulcer on left foot.   Na 140, K 3,2. Cl 99, bicarbonate 22, glucose 75 bun 21 and cr 1,0  Mg 1.2  BNP 760 High sensitive troponin 21. Venous lactic acid 6,4  Wbc 21,2, hgb 12,7, hct 43.1 and plt 303  Sars coivd 19 negative   Urine analysis with SG >1.046, 6-10 wbc   Head CT with no acute changes, positive old infarcts on the right side.   Chest radiograph with increased hilar vascular congestion Ct chest with no pulmonary embolism, bilateral ground glass opacities.  CT abdomen and pelvis with left iliopsoas muscle 4,2 cm intramuscular fluid collection and small bilateral pleural effusions, right greater than left.   EKG with 167 bpm, normal axis, normal qtc, atrial fibrillation with PVC, positive ST depression on V4 to V6 with no significant T wave changes.   Patient was placed on supplemental 02 per [place], and received broad spectrum antibiotic therapy.   IR consulted for IR  drainage of left lower extremity abscess; so far cultures not demonstrating any growth. .  Blood cultures positive for streptococcus; actively receiving treatment with IV Rocephin..   On IV diltiazem for atrial fibrillation; rate has remained controlled.  Assessment and Plan: * Iliopsoas abscess on left [name] Patient with severe sepsis due to iliopsoas abscess with end-organ failure acute hypoxemic respiratory failure.   -Continue following WBCs trend; patient is afebrile. -S/P iliopsoas abscess drainage by IR; culture and sensitivity pending. -Blood cultures positive for streptococcus group c, pending sensitivities.  -Continue treatment with IV Rocephin. -Discontinued stress dose steroids and transition to home dose of prednisone 5 mg daily.  Blood pressure stable. -Continue pain control with as needed analgesics. -Physical therapy has seen patient and recommended home health PT.  Pressure injury of skin- (present on admission) -Stage 2 pressure ulcer on back of left thigh.  -Patient also with Left foot ulcer at the base of the great toe and dorsal 3 th toe.  -And also with a stage II buttocks pressure injury; no signs of superimposed infection. -Continue wound care and constant repositioning.  Acute metabolic encephalopathy- (present on admission) -Her mentation has improved, patient follows commands and answers to simple questions. -Essentially back to baseline. -Continue supportive care on constant reorientation -Continue home dose of bupropion.  Hypomagnesemia Magnesium initially 1.2 Replaced with 2 g mag sulfate Recheck in the a.m.  AKI (acute kidney injury) ([name] -Renal function has improved -Patient advised to maintain adequate hydration -Continue to follow electrolytes trend -Magnesium and potassium  has been repleted.   Chronic atrial fibrillation with RVR ([name] - Rate controlled -Continue to  demonstrate abnormal breathing -Continue metoprolol and oral diltiazem. -Patient chronically on Xarelto for secondary prevention.   Type 2 diabetes mellitus with hyperlipidemia ([name] -Mild hypoglycemic event with CBGs in the 66 range -Continue adjusted dose of insulin therapy -Patient advised to avoid skipping meals and to maintain adequate hydration -Continue to follow CBGs. -Continue the use of statins as part of hyperlipidemia management.   Hypercholesterolemia On statin therapy   Hypertension- (present on admission) -Blood pressure has remained stable -Continue the use of metoprolol and diltiazem -Continue to follow vital signs. -Maintain adequate hydration and follow heart healthy diet.    Subjective:  Patient is feeling better, no chest pain, no nausea, no vomiting, no palpitations.  Mild hypoglycemia earlier today.  Expressed improvement in her left flank/hip area pain.  No fever  Physical Exam: Vitals:   05/23/21 2143 05/24/21 0600 05/24/21 0814 05/24/21 1235  BP: 126/76 107/60  140/80  Pulse: 90 (!) 115  (!) 51  Resp: 17 16  18   Temp: 97.9 F (36.6 C) (!) 97.5 F (36.4 C)    TempSrc:  Oral    SpO2: 90% 92% 94% 94%  Weight:      Height:       General exam: Alert, awake, following commands appropriately and expressing overall improvement in her left flank/hip area pain.  No fever, no nausea, no vomiting.  Denies chest pain or palpitations. Respiratory system: Good air movement bilaterally; no using accessory muscles.  Good saturation on room air. Cardiovascular system: Rate controlled, no rubs, no gallops, no JVD on exam. Gastrointestinal system: Abdomen is nondistended, soft and nontender. No organomegaly or masses felt. Normal bowel sounds heard. Central nervous system: No focal neurological deficits. Extremities: No cyanosis or clubbing; no edema appreciated on exam. Skin: No petechiae.  Present at time of admission left foot ulcers at  the base of her first toe and dorsal third toe; mild serosanguineous drainage without signs of superimposed infection.  See pictures below Psychiatry: Mood & affect appropriate.           Data Reviewed: Creatinine level has improved to 0.99 CBGs with mild hypoglycemia early this morning.   Family Communication: No family at bedside; patient expressed that she will update in over the phone by herself.  Disposition: Status is: Inpatient Remains inpatient appropriate because: sepsis       Planned Discharge Destination: Return back to [facility] for further care and rehabilitation.   Author: Barton Dubois, MD 05/24/2021 5:12 PM  For on call review [email or web address].

## 2021-05-24 NOTE — Evaluation (Signed)
Physical Therapy Evaluation Patient Details Name: [name] MRN: [id number] DOB: Feb 12, 1946 Today's Date: 05/24/2021  History of Present Illness  [name] is a 76 y.o. female with medical history significant of with history of anemia secondary to chronic disease, atrial fibrillation, chronic diastolic heart failure, hyperlipidemia, hypertension, diabetes type 2, stroke, and more presents ED with a chief complaint of altered mental status.  Unfortunately patient is not able to provide any meaningful history.  Chart review reveals that patient has been having some nausea, vomiting, and then became altered.  She was noted to be tacky by EMS.  She does have any history of atrial fibrillation.  She is initially saturating 85% on room room air when she arrived and she was placed on oxygen.  At presentation she was noted to be lethargic, partially responsive.  At admission she is more alert, but still confused and pulling at lines and oxygen etc.  Patient is able to state that she is not in any pain right now.  She does not answer when I asked her about shortness of breath.  She knows her full name, but she thinks she is at [facility].  Patient has no other complaints at this time.  ED provider spoke with son who reported the patient would not want CPR or intubation, or "anything heroic."   Clinical Impression  Patient functioning near baseline for functional mobility and gait with fair/good return for transferring to chair using RW, but limited to a few steps at bedside due to fair/poor standing balance and generalized weakness.  Patient tolerated sitting up in chair after therapy - nursing staff notified.  Patient will benefit from continued skilled physical therapy in hospital and recommended venue below to increase strength, balance, endurance for safe ADLs and gait.          Recommendations for follow up therapy are one component of a multi-disciplinary discharge planning process, led by the  attending physician.  Recommendations may be updated based on patient status, additional functional criteria and insurance authorization.  Follow Up Recommendations Home health PT    Assistance Recommended at Discharge Intermittent Supervision/Assistance  Patient can return home with the following  A lot of help with walking and/or transfers;Assistance with cooking/housework;Help with stairs or ramp for entrance;A lot of help with bathing/dressing/bathroom    Equipment Recommendations None recommended by PT  Recommendations for Other Services       Functional Status Assessment Patient has had a recent decline in their functional status and demonstrates the ability to make significant improvements in function in a reasonable and predictable amount of time.     Precautions / Restrictions Precautions Precautions: Fall Restrictions Weight Bearing Restrictions: No      Mobility  Bed Mobility Overal bed mobility: Needs Assistance Bed Mobility: Supine to Sit     Supine to sit: Min guard, Min assist     General bed mobility comments: increased time, labored movement    Transfers Overall transfer level: Needs assistance Equipment used: Rolling walker (2 wheels) Transfers: Sit to/from Stand, Bed to chair/wheelchair/BSC Sit to Stand: Min assist   Step pivot transfers: Mod assist, Min assist       General transfer comment: slow labored unsteady movement    Ambulation/Gait Ambulation/Gait assistance: Mod assist Gait Distance (Feet): 4 Feet Assistive device: Rolling walker (2 wheels) Gait Pattern/deviations: Decreased step length - right, Decreased step length - left, Decreased stride length Gait velocity: decreased     General Gait Details: limited to a  few slow labored side steps before having to sit due to fatigue/poor standing balance  Stairs            Wheelchair Mobility    Modified Rankin (Stroke Patients Only)       Balance Overall balance  assessment: Needs assistance Sitting-balance support: Feet supported, No upper extremity supported Sitting balance-Leahy Scale: Fair Sitting balance - Comments: fair/good seated at EOB   Standing balance support: Reliant on assistive device for balance, During functional activity, Bilateral upper extremity supported Standing balance-Leahy Scale: Poor Standing balance comment: fair/poor using RW                             Pertinent Vitals/Pain Pain Assessment Pain Assessment: No/denies pain    Home Living Family/patient expects to be discharged to:: Skilled nursing facility                        Prior Function Prior Level of Function : Needs assist       Physical Assist : Mobility (physical) Mobility (physical): Bed mobility;Transfers;Gait;Stairs   Mobility Comments: Mod Independent transfers, Assisted for very short household distances using RW ADLs Comments: assisted by LTC SNF staff     Hand Dominance   Dominant Hand: Right    Extremity/Trunk Assessment   Upper Extremity Assessment Upper Extremity Assessment: Generalized weakness    Lower Extremity Assessment Lower Extremity Assessment: Generalized weakness    Cervical / Trunk Assessment Cervical / Trunk Assessment: Normal  Communication   Communication: No difficulties  Cognition Arousal/Alertness: Awake/alert Behavior During Therapy: WFL for tasks assessed/performed Overall Cognitive Status: Within Functional Limits for tasks assessed                                          General Comments      Exercises     Assessment/Plan    PT Assessment Patient needs continued PT services  PT Problem List Decreased strength;Decreased activity tolerance;Decreased balance;Decreased mobility       PT Treatment Interventions DME instruction;Gait training;Stair training;Functional mobility training;Therapeutic activities;Therapeutic exercise;Patient/family education;Balance  training    PT Goals (Current goals can be found in the Care Plan section)  Acute Rehab PT Goals Patient Stated Goal: return home at LTC facility PT Goal Formulation: With patient Time For Goal Achievement: 05/27/21 Potential to Achieve Goals: Good    Frequency Min 3X/week     Co-evaluation               AM-PAC PT "6 Clicks" Mobility  Outcome Measure Help needed turning from your back to your side while in a flat bed without using bedrails?: A Little Help needed moving from lying on your back to sitting on the side of a flat bed without using bedrails?: A Little Help needed moving to and from a bed to a chair (including a wheelchair)?: A Lot Help needed standing up from a chair using your arms (e.g., wheelchair or bedside chair)?: A Little Help needed to walk in hospital room?: A Lot Help needed climbing 3-5 steps with a railing? : A Lot 6 Click Score: 15    End of Session   Activity Tolerance: Patient tolerated treatment well;Patient limited by fatigue Patient left: in chair;with call bell/phone within reach Nurse Communication: Mobility status PT Visit Diagnosis: Unsteadiness on feet (R26.81);Other abnormalities of gait  and mobility (R26.89);Muscle weakness (generalized) (M62.81)    Time: 1448-1856 PT Time Calculation (min) (ACUTE ONLY): 24 min   Charges:   PT Evaluation $PT Eval Moderate Complexity: 1 Mod PT Treatments $Therapeutic Activity: 23-37 mins        4:45 PM, 05/24/21 Lonell Grandchild, MPT Physical Therapist with [facility] [phone] office [phone] mobile phone

## 2021-05-25 LAB — BASIC METABOLIC PANEL
Anion gap: 8 (ref 5–15)
BUN: 21 mg/dL (ref 8–23)
CO2: 27 mmol/L (ref 22–32)
Calcium: 8.2 mg/dL — ABNORMAL LOW (ref 8.9–10.3)
Chloride: 107 mmol/L (ref 98–111)
Creatinine, Ser: 0.74 mg/dL (ref 0.44–1.00)
GFR, Estimated: >60 mL/min (ref >60)
Glucose, Bld: 114 mg/dL — ABNORMAL HIGH (ref 70–99)
Potassium: 3.1 mmol/L — ABNORMAL LOW (ref 3.5–5.1)
Sodium: 142 mmol/L (ref 135–145)

## 2021-05-25 LAB — CBC
HCT: 35.8 % — ABNORMAL LOW (ref 36.0–46.0)
Hemoglobin: 10.7 g/dL — ABNORMAL LOW (ref 12.0–15.0)
MCH: 26.3 pg (ref 26.0–34.0)
MCHC: 29.9 g/dL — ABNORMAL LOW (ref 30.0–36.0)
MCV: 88 fL (ref 80.0–100.0)
Platelets: 209 10*3/uL (ref 150–400)
RBC: 4.07 MIL/uL (ref 3.87–5.11)
RDW: 19.2 % — ABNORMAL HIGH (ref 11.5–15.5)
WBC: 8.5 10*3/uL (ref 4.0–10.5)
nRBC: 0 % (ref 0.0–0.2)

## 2021-05-25 LAB — BODY FLUID CULTURE W GRAM STAIN: Culture: NO GROWTH

## 2021-05-25 LAB — CULTURE, BLOOD (ROUTINE X 2): Special Requests: ADEQUATE

## 2021-05-25 LAB — GLUCOSE, CAPILLARY: Glucose-Capillary: 100 mg/dL — ABNORMAL HIGH (ref 70–99)

## 2021-05-25 LAB — MAGNESIUM: Magnesium: 2 mg/dL (ref 1.7–2.4)

## 2021-05-25 MED ORDER — CEFADROXIL 500 MG PO CAPS
1000.0000 mg | ORAL_CAPSULE | Freq: Two times a day (BID) | ORAL | Status: DC
Start: 2021-05-25 — End: 2021-05-25
  Filled 2021-05-25 (×3): qty 2

## 2021-05-25 MED ORDER — BUPROPION HCL ER (SR) 100 MG PO TB12: 100.0000 mg | ORAL_TABLET | Freq: Two times a day (BID) | ORAL | Status: DC

## 2021-05-25 MED ORDER — ORENCIA CLICKJECT 125 MG/ML ~~LOC~~ SOAJ
SUBCUTANEOUS | Status: DC
Start: 2021-05-25 — End: 2021-06-10

## 2021-05-25 MED ORDER — METOPROLOL TARTRATE 100 MG PO TABS: 100.0000 mg | ORAL_TABLET | Freq: Two times a day (BID) | ORAL | Status: DC

## 2021-05-25 MED ORDER — FUROSEMIDE 40 MG PO TABS: 20.0000 mg | ORAL_TABLET | Freq: Every day | ORAL | Status: DC | PRN

## 2021-05-25 MED ORDER — CEFADROXIL 500 MG PO CAPS: 1000.0000 mg | ORAL_CAPSULE | Freq: Two times a day (BID) | ORAL | 0 refills | Status: DC

## 2021-05-25 MED ORDER — LEFLUNOMIDE 20 MG PO TABS
ORAL_TABLET | ORAL | Status: DC
Start: 2021-05-25 — End: 2021-06-10

## 2021-05-25 MED ORDER — OXYCODONE HCL 5 MG PO TABS: 5.0000 mg | ORAL_TABLET | Freq: Three times a day (TID) | ORAL | 0 refills | Status: DC | PRN

## 2021-05-25 NOTE — Progress Notes (Signed)
Attempted to call report, RN left a voicemail with a callback number.

## 2021-05-25 NOTE — TOC Transition Note (Signed)
Transition of Care [facility]) - CM/SW Discharge Note   Patient Details  Name: [name] MRN: [id number] Date of Birth: [birth year]  Transition of Care [facility]) CM/SW Contact:  Shade Flood, LCSW Phone Number: 05/25/2021, 10:52 AM   Clinical Narrative:     Pt stable for dc today per MD. Updated Melissa at [facility] and they can accept pt back today. Spoke with pt's son, Fara Olden, to update. He is in agreement with return to [facility] via EMS.   DC clinical sent electronically. RN to call report. EMS arranged. There are no other TOC needs for dc.  Final next level of care: Long Term Nursing Home Barriers to Discharge: Barriers Resolved   Patient Goals and CMS Choice Patient states their goals for this hospitalization and ongoing recovery are:: Return to LTC CMS Medicare.gov Compare Post Acute Care list provided to:: Patient Represenative (must comment) Choice offered to / list presented to : Adult Children  Discharge Placement                Patient to be transferred to facility by: EMS Name of family member notified: Fara Olden Patient and family notified of of transfer: 05/25/21  Discharge Plan and Services     Post Acute Care Choice: [place], Resumption of Svcs/PTA Provider                               Social Determinants of Health (SDOH) Interventions     Readmission Risk Interventions Readmission Risk Prevention Plan 05/21/2021 04/01/2020  Transportation Screening Complete Complete  PCP or Specialist Appt within 3-5 Days - Not Complete  Not Complete comments - Patient's PCP appointments are set up at her facility  [place] or [facility] - Complete  Social Work Consult for [place] Planning/Counseling - Complete  Palliative Care Screening - Complete  Medication Review Press photographer) Complete Complete  SW Recovery Care/Counseling Consult Complete -  [place] Not Applicable -  Some recent data might be hidden

## 2021-05-25 NOTE — Discharge Summary (Signed)
Physician Discharge Summary   Patient: [name] MRN: [id number] DOB: 07-Jul-1945  Admit date:     05/20/2021  Discharge date: 05/25/21  Discharge Physician: Barton Dubois   PCP: Aletha Halim., PA-C   Recommendations at discharge:  Repeat basic metabolic panel to follow to lites and renal function Reassess blood pressure and adjust antihypertensive treatment as needed Please resume the use of rheumatoid arthritis medications once antibiotic therapy completed. Close monitoring of patient's CBGs/A1c with further adjustment to hypoglycemia regimen as required.   Discharge Diagnoses: Principal Problem:   Iliopsoas abscess on left [facility]) Active Problems:   Hypertension   Type 2 diabetes mellitus with hyperlipidemia (HCC)   Chronic atrial fibrillation with RVR (HCC)   AKI (acute kidney injury) ([place])   Acute metabolic encephalopathy   Pressure injury of skin Hypokalemia/hypomagnesemia.   Hospital brief admission narrative: As per H&P written by Dr. Clearence Ped On 05/21/2021 [name] is a 76 y.o. female with medical history significant of with history of anemia secondary to chronic disease, atrial fibrillation, chronic diastolic heart failure, hyperlipidemia, hypertension, diabetes type 2, stroke, and more presents ED with a chief complaint of altered mental status.  Unfortunately patient is not able to provide any meaningful history.  Chart review reveals that patient has been having some nausea, vomiting, and then became altered.  She was noted to be tacky by EMS.  She does have any history of atrial fibrillation.  She is initially saturating 85% on room room air when she arrived and she was placed on oxygen.  At presentation she was noted to be lethargic, partially responsive.  At admission she is more alert, but still confused and pulling at lines and oxygen etc.  Patient is able to state that she is not in any pain right now.  She does not answer when I asked her about  shortness of breath.  She knows her full name, but she thinks she is at [facility].  Patient has no other complaints at this time.  ED provider spoke with son who reported the patient would not want CPR or intubation, or "anything heroic."    Assessment and Plan: * Iliopsoas abscess on left [facility])- (present on admission) Patient with severe sepsis due to iliopsoas abscess with end-organ failure acute hypoxemic respiratory failure.   -Continue following WBCs trend; patient is afebrile. -S/P iliopsoas abscess drainage by IR; final culture pending at discharge.  No specific growth identified (unfortunately drainage has happened after patient being on antibiotic therapy). -Blood cultures positive for streptococcus group c, resistant only to clindamycin and erythromycin. -Patient responded well to treatment with IV Rocephin. -Case discussed with infectious disease service who has recommended 16 more days using cefadroxil therapy. -2D echo demonstrated no bipolar disorder. -Patient afebrile and stable at time of discharge. -Continue as needed analgesics. -Discontinued stress dose steroids and transition to home dose of chronic prednisone 5 mg daily.  -Physical therapy has seen patient and recommended home health PT; which can be provided at a skilled nursing facility for rehabilitation.  Pressure injury of skin- (present on admission) -Stage 2 pressure ulcer on back of left thigh.  -Patient also with Left foot ulcer at the base of the great toe and dorsal 3 th toe.  -And also with stage II buttocks pressure injury; no signs of superimposed infection. -Continue wound care and constant repositioning.  Acute metabolic encephalopathy- (present on admission) -Her mentation has improved, patient follows commands and answers to simple questions appropriately. -Essentially back to  baseline. -Continue supportive care on constant reorientation -Continue home dose of bupropion.  Hypomagnesemia Magnesium  initially 1.2 Replaced with 2 g mag sulfate Recheck in the a.m.  AKI (acute kidney injury) ([place])- (present on admission) -Renal function has improved/resolved. -Patient advised to maintain adequate hydration -Repeat basic metabolic panel to follow ultralights and renal function; electrolytes were repleted and within normal limits at discharge. -Repeat basic metabolic panel at follow-up visit to reassess renal function and stability.  Chronic atrial fibrillation with RVR ([place])- (present on admission) -Rate controlled -Continue to demonstrate abnormal rhythm. -Continue metoprolol and oral diltiazem. -Patient chronically on Xarelto for secondary prevention. -Continue patient follow-up with cardiology service.   Type 2 diabetes mellitus with hyperlipidemia ([place])- (present on admission) -Mild hypoglycemic event was appreciated throughout hospitalization in the setting of poor oral intake. -Since then CBGs has remained stable and rising. -Continue adjusted dose of insulin therapy -Patient advised to avoid skipping meals and to maintain adequate hydration -Continue to follow CBGs and further adjust hypoglycemic regimen as needed.. -Continue the use of statins as part of hyperlipidemia management. -Heart healthy diet and modified carbohydrate diet recommended.   Hypercholesterolemia - Continue treatment with statins -Heart healthy diet recommended.  Hypertension- (present on admission) -Blood pressure has remained stable -Continue the use of metoprolol and diltiazem -Continue to follow vital signs. -Maintain adequate hydration and follow heart healthy diet.    Consultants: Infectious disease and interventional radiologist. Procedures performed: See below for x-ray reports.  2D echo demonstrating no significant valvular disorder and preserved ejection fraction. Disposition: Skilled nursing facility  Diet recommendation:  Heart healthy diet and modified carbohydrates.  DISCHARGE  MEDICATION: Allergies as of 05/25/2021       Reactions   Promethazine         Medication List     STOP taking these medications    diltiazem 240 MG 24 hr capsule Commonly known as: TIAZAC   HYDROcodone-acetaminophen 5-325 MG tablet Commonly known as: NORCO/VICODIN   lidocaine 5 % Commonly known as: LIDODERM   Santyl ointment Generic drug: collagenase   spironolactone 25 MG tablet Commonly known as: ALDACTONE   Vitamin D3 25 MCG (1000 UT) Caps       TAKE these medications    acetaminophen 650 MG CR tablet Commonly known as: TYLENOL Take 1,300 mg by mouth in the morning and at bedtime. What changed: Another medication with the same name was removed. Continue taking this medication, and follow the directions you see here.   albuterol 108 (90 Base) MCG/ACT inhaler Commonly known as: VENTOLIN HFA Inhale 2 puffs into the lungs every 4 (four) hours as needed for wheezing or shortness of breath.   Artificial Tears 1 % ophthalmic solution Generic drug: carboxymethylcellulose Place 2 drops into both eyes in the morning.   atorvastatin 20 MG tablet Commonly known as: LIPITOR Take 20 mg by mouth daily. What changed: Another medication with the same name was removed. Continue taking this medication, and follow the directions you see here.   azelastine 0.05 % ophthalmic solution Commonly known as: OPTIVAR Place 1 drop into both eyes 2 (two) times daily.   buPROPion ER 100 MG 12 hr tablet Commonly known as: Wellbutrin SR Take 1 tablet (100 mg total) by mouth 2 (two) times daily. What changed:  See the new instructions. Another medication with the same name was removed. Continue taking this medication, and follow the directions you see here.   Calcium 1200 1200-1000 MG-UNIT Chew 1 tablet with a meal  cefadroxil 500 MG capsule Commonly known as: DURICEF Take 2 capsules (1,000 mg total) by mouth 2 (two) times daily for 16 days.   cetirizine 10 MG tablet Commonly  known as: ZYRTEC 1 tablet   cycloSPORINE 0.05 % ophthalmic emulsion Commonly known as: RESTASIS Place 1 drop into both eyes 2 (two) times daily.   diclofenac Sodium 1 % Gel Commonly known as: VOLTAREN Apply 2 g topically at bedtime as needed for pain. What changed: Another medication with the same name was removed. Continue taking this medication, and follow the directions you see here.   diltiazem 240 MG 24 hr capsule Commonly known as: CARDIZEM CD Take 240 mg by mouth daily.   feeding supplement (PRO-STAT 64) Liqd Take 30 mLs by mouth in the morning.   fluticasone-salmeterol 115-21 MCG/ACT inhaler Commonly known as: ADVAIR HFA Inhale 2 puffs into the lungs 2 times daily at 12 noon and 4 pm.   furosemide 40 MG tablet Commonly known as: LASIX Take 0.5 tablets (20 mg total) by mouth daily as needed for fluid or edema. What changed:  how much to take when to take this reasons to take this   gabapentin 100 MG capsule Commonly known as: NEURONTIN Take 3 capsules (300 mg total) by mouth 3 (three) times daily. What changed: Another medication with the same name was removed. Continue taking this medication, and follow the directions you see here.   insulin glargine 100 UNIT/ML injection Commonly known as: LANTUS Inject 30 Units into the skin in the morning. Hold if BS is less than 150   insulin lispro 100 UNIT/ML injection Commonly known as: HUMALOG Inject 6 Units into the skin in the morning and at bedtime. Hold is BS <100   leflunomide 20 MG tablet Commonly known as: ARAVA Hold until follow up with PCP What changed: See the new instructions.   metFORMIN 500 MG 24 hr tablet Commonly known as: GLUCOPHAGE-XR Take 1,000 mg by mouth 2 (two) times daily. What changed: Another medication with the same name was removed. Continue taking this medication, and follow the directions you see here.   metoprolol tartrate 100 MG tablet Commonly known as: LOPRESSOR Take 1 tablet (100  mg total) by mouth 2 (two) times daily.   nitroGLYCERIN 0.4 MG SL tablet Commonly known as: NITROSTAT Place 1 tablet (0.4 mg total) under the tongue every 5 (five) minutes as needed for chest pain (x 3 doses).   OcuSoft Lid Scrub Original Pads Apply 1 application topically in the morning and at bedtime. Apply to both eyelids for eye problem   omeprazole 20 MG capsule Commonly known as: PRILOSEC Take 1 capsule by mouth daily. What changed: Another medication with the same name was removed. Continue taking this medication, and follow the directions you see here.   Orencia ClickJect 119 MG/ML Soaj Generic drug: Abatacept Hold until follow up with PCP What changed: See the new instructions.   oxyCODONE 5 MG immediate release tablet Commonly known as: Oxy IR/ROXICODONE Take 1 tablet (5 mg total) by mouth every 8 (eight) hours as needed for severe pain. What changed:  how much to take when to take this reasons to take this   predniSONE 5 MG tablet Commonly known as: DELTASONE Take 5 mg by mouth daily.   PreserVision AREDS 2 Caps Take 1 capsule by mouth daily. What changed: Another medication with the same name was removed. Continue taking this medication, and follow the directions you see here.   Rejuvacare Plus Crea Apply 1  application topically in the morning and at bedtime. Apply to face for dry skin patches   Rivaroxaban 15 MG Tabs tablet Commonly known as: XARELTO Take 1 tablet (15 mg total) by mouth daily. What changed: how much to take   senna 8.6 MG Tabs tablet Commonly known as: SENOKOT Take 1 tablet by mouth daily.   Vascepa 1 g capsule Generic drug: icosapent Ethyl Take 1 capsule by mouth every morning.   VITAMIN C PO Take 500 mg by mouth in the morning and at bedtime.   zinc sulfate 220 (50 Zn) MG capsule Take 220 mg by mouth daily.               Discharge Care Instructions  (From admission, onward)           Start     Ordered   05/25/21  0000  Discharge wound care:       Comments: Apply a small amount of hydrogel to the left medial first met head until wounds and cover with dry dressings on daily basis.  Cover all wounds with silicone foam after proper cleaning and drying wounds; exchange every 3 days.  Provide constant repositioning.  No topical care to patient left lower extremity pretibial wound; leave open to air.   05/25/21 0935             Discharge Exam: Filed Weights   05/20/21 2151 05/20/21 2327 05/21/21 1038  Weight: 82.2 kg 82.1 kg 83.5 kg   General exam: Alert, awake, following commands appropriately and expressing overall improvement in her left flank/hip area pain.  No fever, no nausea, no vomiting.  Denies chest pain or palpitations. Respiratory system: Good air movement bilaterally; no using accessory muscles.  Good saturation on room air. Cardiovascular system: Rate controlled, no rubs, no gallops, no JVD on exam. Gastrointestinal system: Abdomen is nondistended, soft and nontender. No organomegaly or masses felt. Normal bowel sounds heard. Central nervous system: No focal neurological deficits. Extremities: No cyanosis or clubbing; no edema appreciated on exam. Skin: No petechiae.  Present at time of admission left foot ulcers at the base of her first toe and dorsal third toe; mild serosanguineous drainage without signs of superimposed infection.   Psychiatry: Mood & affect appropriate.  Patient is following commands appropriately.  Condition at discharge: Stable and improved.  The results of significant diagnostics from this hospitalization (including imaging, microbiology, ancillary and laboratory) are listed below for reference.   Imaging Studies: CT Head Wo Contrast  Result Date: 05/21/2021 CLINICAL DATA:  Altered mental status, nausea/vomiting EXAM: CT HEAD WITHOUT CONTRAST TECHNIQUE: Contiguous axial images were obtained from the base of the skull through the vertex without intravenous  contrast. RADIATION DOSE REDUCTION: This exam was performed according to the departmental dose-optimization program which includes automated exposure control, adjustment of the mA and/or kV according to patient size and/or use of iterative reconstruction technique. COMPARISON:  05/05/2021 FINDINGS: Brain: No evidence of acute infarction, hemorrhage, hydrocephalus, extra-axial collection or mass lesion/mass effect. Subcortical white matter and periventricular small vessel ischemic changes. Encephalomalacic changes related to prior right temporo-occipital infarcts. Vascular: Intracranial atherosclerosis. Skull: Normal. Negative for fracture or focal lesion. Sinuses/Orbits: The visualized paranasal sinuses are essentially clear. The mastoid air cells are unopacified. Other: None. IMPRESSION: No evidence of acute intracranial abnormality. Small vessel ischemic changes. Old right sided infarcts. Electronically Signed   By: Julian Hy M.D.   On: 05/21/2021 01:06   CT Head Wo Contrast  Result Date: 05/05/2021 CLINICAL DATA:  Head trauma, minor (Age >= 65y); Neck trauma (Age >= 65y) EXAM: CT HEAD WITHOUT CONTRAST CT CERVICAL SPINE WITHOUT CONTRAST TECHNIQUE: Multidetector CT imaging of the head and cervical spine was performed following the standard protocol without intravenous contrast. Multiplanar CT image reconstructions of the cervical spine were also generated. RADIATION DOSE REDUCTION: This exam was performed according to the departmental dose-optimization program which includes automated exposure control, adjustment of the mA and/or kV according to patient size and/or use of iterative reconstruction technique. COMPARISON:  CT head 10/23/2010, CT head 07/24/2017 FINDINGS: CT HEAD FINDINGS BRAIN: BRAIN Encephalomalacia of the right temporoparietal and occipital lobes again noted. No evidence of large-territorial acute infarction. No parenchymal hemorrhage. No mass lesion. No extra-axial collection. No mass  effect or midline shift. No hydrocephalus. Basilar cisterns are patent. Vascular: No hyperdense vessel. Skull: No acute fracture or focal lesion. Sinuses/Orbits: Paranasal sinuses and mastoid air cells are clear. Bilateral lens replacement. Otherwise the orbits are unremarkable. Other: None. CT CERVICAL SPINE FINDINGS Alignment: Normal. Skull base and vertebrae: Multilevel severe degenerative changes of the spine most prominent at the C6-C7 levels. No acute fracture. No aggressive appearing focal osseous lesion or focal pathologic process. Soft tissues and spinal canal: No prevertebral fluid or swelling. No visible canal hematoma. Upper chest: Unremarkable. Other: None. IMPRESSION: 1. No acute intracranial abnormality. 2. No acute displaced fracture or traumatic listhesis of the cervical spine. Electronically Signed   By: Iven Finn M.D.   On: 05/05/2021 22:48   CT Angio Chest PE W/Cm &/Or Wo Cm  Result Date: 05/21/2021 CLINICAL DATA:  Tachycardia, history of AFib. Sepsis. Nausea/vomiting. Abdominal pain. EXAM: CT ANGIOGRAPHY CHEST CT ABDOMEN AND PELVIS WITH CONTRAST TECHNIQUE: Multidetector CT imaging of the chest was performed using the standard protocol during bolus administration of intravenous contrast. Multiplanar CT image reconstructions and MIPs were obtained to evaluate the vascular anatomy. Multidetector CT imaging of the abdomen and pelvis was performed using the standard protocol during bolus administration of intravenous contrast. RADIATION DOSE REDUCTION: This exam was performed according to the departmental dose-optimization program which includes automated exposure control, adjustment of the mA and/or kV according to patient size and/or use of iterative reconstruction technique. CONTRAST:  162m OMNIPAQUE IOHEXOL 350 MG/ML SOLN COMPARISON:  CTA chest dated 10/08/2014. FINDINGS: CTA CHEST FINDINGS Cardiovascular: Satisfactory opacification of the bilateral pulmonary arteries to the lobar  level. No evidence of pulmonary embolism. Study is not tailored for evaluation of the thoracic aorta. No evidence of thoracic aortic aneurysm. Atherosclerotic calcifications of the aortic arch. Cardiomegaly.  No pericardial effusion. Coronary atherosclerosis of the LAD and right coronary artery. Mediastinum/Nodes: No suspicious mediastinal lymphadenopathy. Visualized thyroid is unremarkable. Lungs/Pleura: Mild interstitial edema with small right and trace left pleural effusions. Associated bilateral lower lobe opacities, likely atelectasis. Evaluation of the lung parenchyma is constrained by respiratory motion. Within that constraint, there are no suspicious pulmonary nodules. No pneumothorax. Musculoskeletal: Mild degenerative changes of the mid/lower thoracic spine. Review of the MIP images confirms the above findings. CT ABDOMEN and PELVIS FINDINGS Motion degraded images. Hepatobiliary: Liver is within normal limits. Gallbladder is unremarkable. No intrahepatic or extrahepatic ductal dilatation. Pancreas: Within normal limits. Spleen: Within normal limits. Adrenals/Urinary Tract: Adrenal glands are within normal limits. 3.0 cm hyperdense/hemorrhagic right upper pole renal cyst (series 13/image 37). Additional 13 mm posterior right lower pole renal cyst (series 13/image 42), simple. Left kidney is within normal limits. No hydronephrosis. Bladder is within normal limits. Stomach/Bowel: Stomach is within normal limits. No evidence of bowel  obstruction. Normal appendix (series 13/image 99). Left colon is decompressed scattered mild colonic diverticulosis. Vascular/Lymphatic: No evidence of abdominal aortic aneurysm. Atherosclerotic calcifications of the abdominal aorta and branch vessels. Small retroperitoneal lymph nodes which do not meet pathologic CT size criteria. Reproductive: Uterine fibroid. Left ovary is within normal limits.  No right adnexal mass. Other: No abdominopelvic ascites. Musculoskeletal: 2.7 x 3.4  x 4.2 cm fluid collection in the left iliopsoas muscle (series 13/image 78), partially obscured by streak artifact. Degenerative changes of the lumbar spine. Bilateral hip arthroplasties, without evidence of complication. Review of the MIP images confirms the above findings. IMPRESSION: No evidence of pulmonary embolism. Cardiomegaly with mild interstitial edema and small bilateral pleural effusions, right greater than left. 4.2 cm intramuscular fluid collection the left iliopsoas muscle, partially obscured by streak artifact. Uterine fibroids. Electronically Signed   By: Julian Hy M.D.   On: 05/21/2021 01:14   CT Cervical Spine Wo Contrast  Result Date: 05/05/2021 CLINICAL DATA:  Head trauma, minor (Age >= 65y); Neck trauma (Age >= 65y) EXAM: CT HEAD WITHOUT CONTRAST CT CERVICAL SPINE WITHOUT CONTRAST TECHNIQUE: Multidetector CT imaging of the head and cervical spine was performed following the standard protocol without intravenous contrast. Multiplanar CT image reconstructions of the cervical spine were also generated. RADIATION DOSE REDUCTION: This exam was performed according to the departmental dose-optimization program which includes automated exposure control, adjustment of the mA and/or kV according to patient size and/or use of iterative reconstruction technique. COMPARISON:  CT head 10/23/2010, CT head 07/24/2017 FINDINGS: CT HEAD FINDINGS BRAIN: BRAIN Encephalomalacia of the right temporoparietal and occipital lobes again noted. No evidence of large-territorial acute infarction. No parenchymal hemorrhage. No mass lesion. No extra-axial collection. No mass effect or midline shift. No hydrocephalus. Basilar cisterns are patent. Vascular: No hyperdense vessel. Skull: No acute fracture or focal lesion. Sinuses/Orbits: Paranasal sinuses and mastoid air cells are clear. Bilateral lens replacement. Otherwise the orbits are unremarkable. Other: None. CT CERVICAL SPINE FINDINGS Alignment: Normal. Skull  base and vertebrae: Multilevel severe degenerative changes of the spine most prominent at the C6-C7 levels. No acute fracture. No aggressive appearing focal osseous lesion or focal pathologic process. Soft tissues and spinal canal: No prevertebral fluid or swelling. No visible canal hematoma. Upper chest: Unremarkable. Other: None. IMPRESSION: 1. No acute intracranial abnormality. 2. No acute displaced fracture or traumatic listhesis of the cervical spine. Electronically Signed   By: Iven Finn M.D.   On: 05/05/2021 22:48   CT Abdomen Pelvis W Contrast  Result Date: 05/21/2021 CLINICAL DATA:  Tachycardia, history of AFib. Sepsis. Nausea/vomiting. Abdominal pain. EXAM: CT ANGIOGRAPHY CHEST CT ABDOMEN AND PELVIS WITH CONTRAST TECHNIQUE: Multidetector CT imaging of the chest was performed using the standard protocol during bolus administration of intravenous contrast. Multiplanar CT image reconstructions and MIPs were obtained to evaluate the vascular anatomy. Multidetector CT imaging of the abdomen and pelvis was performed using the standard protocol during bolus administration of intravenous contrast. RADIATION DOSE REDUCTION: This exam was performed according to the departmental dose-optimization program which includes automated exposure control, adjustment of the mA and/or kV according to patient size and/or use of iterative reconstruction technique. CONTRAST:  141m OMNIPAQUE IOHEXOL 350 MG/ML SOLN COMPARISON:  CTA chest dated 10/08/2014. FINDINGS: CTA CHEST FINDINGS Cardiovascular: Satisfactory opacification of the bilateral pulmonary arteries to the lobar level. No evidence of pulmonary embolism. Study is not tailored for evaluation of the thoracic aorta. No evidence of thoracic aortic aneurysm. Atherosclerotic calcifications of the aortic arch. Cardiomegaly.  No pericardial effusion. Coronary atherosclerosis of the LAD and right coronary artery. Mediastinum/Nodes: No suspicious mediastinal  lymphadenopathy. Visualized thyroid is unremarkable. Lungs/Pleura: Mild interstitial edema with small right and trace left pleural effusions. Associated bilateral lower lobe opacities, likely atelectasis. Evaluation of the lung parenchyma is constrained by respiratory motion. Within that constraint, there are no suspicious pulmonary nodules. No pneumothorax. Musculoskeletal: Mild degenerative changes of the mid/lower thoracic spine. Review of the MIP images confirms the above findings. CT ABDOMEN and PELVIS FINDINGS Motion degraded images. Hepatobiliary: Liver is within normal limits. Gallbladder is unremarkable. No intrahepatic or extrahepatic ductal dilatation. Pancreas: Within normal limits. Spleen: Within normal limits. Adrenals/Urinary Tract: Adrenal glands are within normal limits. 3.0 cm hyperdense/hemorrhagic right upper pole renal cyst (series 13/image 37). Additional 13 mm posterior right lower pole renal cyst (series 13/image 42), simple. Left kidney is within normal limits. No hydronephrosis. Bladder is within normal limits. Stomach/Bowel: Stomach is within normal limits. No evidence of bowel obstruction. Normal appendix (series 13/image 99). Left colon is decompressed scattered mild colonic diverticulosis. Vascular/Lymphatic: No evidence of abdominal aortic aneurysm. Atherosclerotic calcifications of the abdominal aorta and branch vessels. Small retroperitoneal lymph nodes which do not meet pathologic CT size criteria. Reproductive: Uterine fibroid. Left ovary is within normal limits.  No right adnexal mass. Other: No abdominopelvic ascites. Musculoskeletal: 2.7 x 3.4 x 4.2 cm fluid collection in the left iliopsoas muscle (series 13/image 78), partially obscured by streak artifact. Degenerative changes of the lumbar spine. Bilateral hip arthroplasties, without evidence of complication. Review of the MIP images confirms the above findings. IMPRESSION: No evidence of pulmonary embolism. Cardiomegaly with  mild interstitial edema and small bilateral pleural effusions, right greater than left. 4.2 cm intramuscular fluid collection the left iliopsoas muscle, partially obscured by streak artifact. Uterine fibroids. Electronically Signed   By: Julian Hy M.D.   On: 05/21/2021 01:14   CT ASPIRATION  Result Date: 05/22/2021 INDICATION: 76 year old with bacteremia and left iliopsoas fluid collection. Fluid collection is concerning for a possible abscess. EXAM: CT-guided placement of a drainage catheter in the left iliopsoas fluid collection MEDICATIONS: Moderate sedation ANESTHESIA/SEDATION: Moderate (conscious) sedation was employed during this procedure. A total of Versed 1.59m and fentanyl 100 mcg was administered intravenously at the order of the provider performing the procedure. Total intra-service moderate sedation time: 29 minutes. Patient's level of consciousness and vital signs were monitored continuously by radiology nurse throughout the procedure under the supervision of the provider performing the procedure. COMPLICATIONS: None immediate. PROCEDURE: Informed consent was obtained for image guided aspiration or drainage. A timeout was performed prior to the initiation of the procedure. Patient was placed supine on the CT scanner. Images through the lower abdomen and pelvis were obtained. Fluid collection along the anterior aspect of the left iliopsoas muscle was identified and targeted. Overlying skin was prepped with chlorhexidine and sterile field was created. Maximal barrier sterile technique was utilized including caps, mask, sterile gowns, sterile gloves, sterile drape, hand hygiene and skin antiseptic. Skin was anesthetized 1% lidocaine. A small incision was made. Using CT guidance, an 18 gauge trocar needle was directed into the left iliopsoas collection. It was difficult to advance the needle into this collection. In addition, no fluid could be aspirated from the collection through the needle.  Therefore, a superstiff Amplatz wire was advanced into the collection and a 10 [place] drain was placed. Yellow viscous fluid was removed from the collection. Findings were suggestive for synovial like fluid. Approximately 13 mL of fluid was  removed. No additional fluid could be aspirated through the drain. Based on the consistency and appearance of the fluid, decided that there was no need to leave the drain in place. Drain was cut and completely removed. Bandage placed over the puncture site. FINDINGS: Low-density fluid collection along the anterior aspect of left iliopsoas muscle. No fluid could be aspirated through the needle but 13 mL of thick viscous yellow fluid was removed. Findings are suggestive for synovial or bursal fluid. IMPRESSION: CT-guided placement of a drainage catheter within the left iliopsoas fluid collection. Thick viscous yellow fluid was removed and suggestive for left iliopsoas bursal fluid. Based on the appearance of the fluid and minimal drainage, the drain was removed at the end of the procedure. Fluid was sent for culture and crystals. Electronically Signed   By: Markus Daft M.D.   On: 05/22/2021 17:49   Portable chest 1 View  Result Date: 05/21/2021 CLINICAL DATA:  76 year old female with altered mental status. Tachycardia and confusion. Atrial fibrillation with RVR. EXAM: PORTABLE CHEST 1 VIEW COMPARISON:  CTA chest 0046 hours today. FINDINGS: Portable AP upright view at 0537 hours. Right greater than left small pleural effusions better demonstrated by CTA. Pulmonary vascular congestion appears mildly improved from portable chest yesterday. No pneumothorax. Stable cardiac size and mediastinal contours. No consolidation. Visualized tracheal air column is within normal limits. No acute osseous abnormality identified. Negative visible bowel gas. IMPRESSION: Slightly improved pulmonary edema since yesterday. Small right greater than left pleural effusions better  demonstrated by CTA earlier today. Electronically Signed   By: Genevie Ann M.D.   On: 05/21/2021 05:54   DG Chest Port 1 View  Result Date: 05/20/2021 CLINICAL DATA:  Question sepsis. Nausea, vomiting and altered mental status. EXAM: PORTABLE CHEST 1 VIEW COMPARISON:  04/03/2020.  03/31/2020. FINDINGS: The heart is enlarged. There are prominent interstitial markings, probably with mild interstitial edema. No infiltrate, collapse or visible effusion. IMPRESSION: Interstitial edema pattern. No focal infiltrate, collapse or visible effusion. Electronically Signed   By: [name] Chimes M.D.   On: 05/20/2021 22:43   ECHOCARDIOGRAM COMPLETE  Result Date: 05/21/2021    ECHOCARDIOGRAM REPORT   Patient Name:   [name] Date of Exam: 05/21/2021 Medical Rec #:  [id number]      Height:       67.0 in Accession #:    [id number]     Weight:       184.1 lb Date of Birth:  09-Apr-1945      BSA:          1.952 m Patient Age:    24 years       BP:           84/69 mmHg Patient Gender: F              HR:           107 bpm. Exam Location:  [facility] Procedure: 2D Echo, Cardiac Doppler and Color Doppler Indications:    CHF  History:        Patient has prior history of Echocardiogram examinations, most                 recent 04/01/2020. CHF, Stroke, Arrythmias:Atrial Fibrillation,                 Signs/Symptoms:Chest Pain; Risk Factors:Hypertension, Diabetes                 and Dyslipidemia.  Sonographer:    Wenda Low Referring  Phys: [id number] ASIA B ZIERLE-GHOSH  Sonographer Comments: No subcostal window. Image acquisition challenging due to patient behavioral factors. IMPRESSIONS  1. Left ventricular ejection fraction, by estimation, is 65 to 70%. The left ventricle has normal function. The left ventricle has no regional wall motion abnormalities. There is mild left ventricular hypertrophy. Left ventricular diastolic parameters are indeterminate.  2. Right ventricular systolic function is normal. The right ventricular size is  normal. There is moderately elevated pulmonary artery systolic pressure.  3. The mitral valve is normal in structure. Trivial mitral valve regurgitation.  4. The aortic valve is tricuspid. Aortic valve regurgitation is not visualized. Comparison(s): The left ventricular function is unchanged. FINDINGS  Left Ventricle: Left ventricular ejection fraction, by estimation, is 65 to 70%. The left ventricle has normal function. The left ventricle has no regional wall motion abnormalities. The left ventricular internal cavity size was normal in size. There is  mild left ventricular hypertrophy. Left ventricular diastolic parameters are indeterminate. Right Ventricle: The right ventricular size is normal. Right vetricular wall thickness was not assessed. Right ventricular systolic function is normal. There is moderately elevated pulmonary artery systolic pressure. The tricuspid regurgitant velocity is  3.15 m/s, and with an assumed right atrial pressure of 8 mmHg, the estimated right ventricular systolic pressure is 99.2 mmHg. Left Atrium: Left atrial size was normal in size. Right Atrium: Right atrial size was normal in size. Pericardium: There is no evidence of pericardial effusion. Mitral Valve: The mitral valve is normal in structure. There is mild thickening of the mitral valve leaflet(s). Mild to moderate mitral annular calcification. Trivial mitral valve regurgitation. MV peak gradient, 14.0 mmHg. The mean mitral valve gradient  is 6.0 mmHg. Tricuspid Valve: The tricuspid valve is normal in structure. Tricuspid valve regurgitation is mild. Aortic Valve: The aortic valve is tricuspid. Aortic valve regurgitation is not visualized. Aortic valve mean gradient measures 4.0 mmHg. Aortic valve peak gradient measures 6.7 mmHg. Aortic valve area, by VTI measures 2.28 cm. Pulmonic Valve: The pulmonic valve was not well visualized. Pulmonic valve regurgitation is not visualized. No evidence of pulmonic stenosis. Aorta: The  aortic root and ascending aorta are structurally normal, with no evidence of dilitation. IAS/Shunts: No atrial level shunt detected by color flow Doppler.  LEFT VENTRICLE PLAX 2D LVIDd:         4.00 cm   Diastology LVIDs:         2.50 cm   LV e' lateral:   9.57 cm/s LV PW:         1.40 cm   LV E/e' lateral: 18.3 LV IVS:        1.40 cm LVOT diam:     1.90 cm LV SV:         47 LV SV Index:   24 LVOT Area:     2.84 cm  RIGHT VENTRICLE RV Basal diam:  3.80 cm RV Mid diam:    2.50 cm RV S prime:     8.05 cm/s LEFT ATRIUM             Index        RIGHT ATRIUM           Index LA diam:        4.70 cm 2.41 cm/m   RA Area:     19.70 cm LA Vol (A2C):   61.6 ml 31.55 ml/m  RA Volume:   53.10 ml  27.20 ml/m LA Vol (A4C):   54.5 ml 27.92 ml/m LA  Biplane Vol: 60.4 ml 30.94 ml/m  AORTIC VALVE                    PULMONIC VALVE AV Area (Vmax):    2.17 cm     PV Vmax:       0.50 m/s AV Area (Vmean):   2.14 cm     PV Peak grad:  1.0 mmHg AV Area (VTI):     2.28 cm AV Vmax:           129.00 cm/s AV Vmean:          86.100 cm/s AV VTI:            0.208 m AV Peak Grad:      6.7 mmHg AV Mean Grad:      4.0 mmHg LVOT Vmax:         98.80 cm/s LVOT Vmean:        65.000 cm/s LVOT VTI:          0.167 m LVOT/AV VTI ratio: 0.80  AORTA Ao Root diam: 2.70 cm Ao Asc diam:  3.30 cm MITRAL VALVE                TRICUSPID VALVE MV Area (PHT): 4.68 cm     TR Peak grad:   39.7 mmHg MV Area VTI:   1.53 cm     TR Vmax:        315.00 cm/s MV Peak grad:  14.0 mmHg MV Mean grad:  6.0 mmHg     SHUNTS MV Vmax:       1.87 m/s     Systemic VTI:  0.17 m MV Vmean:      104.0 cm/s   Systemic Diam: 1.90 cm MV Decel Time: 162 msec MV E velocity: 175.00 cm/s Dorris Carnes MD Electronically signed by Dorris Carnes MD Signature Date/Time: 05/21/2021/5:43:55 PM    Final     Microbiology: Results for orders placed or performed during the hospital encounter of 05/20/21  Resp Panel by RT-PCR (Flu A&B, Covid) Nasopharyngeal Swab     Status: None   Collection Time:  05/20/21 10:10 PM   Specimen: Nasopharyngeal Swab; Nasopharyngeal(NP) swabs in vial transport medium  Result Value Ref Range Status   SARS Coronavirus 2 by RT PCR NEGATIVE NEGATIVE Final    Comment: (NOTE) SARS-CoV-2 target nucleic acids are NOT DETECTED.  The SARS-CoV-2 RNA is generally detectable in upper respiratory specimens during the acute phase of infection. The lowest concentration of SARS-CoV-2 viral copies this assay can detect is 138 copies/mL. A negative result does not preclude SARS-Cov-2 infection and should not be used as the sole basis for treatment or other patient management decisions. A negative result may occur with  improper specimen collection/handling, submission of specimen other than nasopharyngeal swab, presence of viral mutation(s) within the areas targeted by this assay, and inadequate number of viral copies(<138 copies/mL). A negative result must be combined with clinical observations, patient history, and epidemiological information. The expected result is Negative.  Fact Sheet for Patients:  EntrepreneurPulse.com.au  Fact Sheet for Healthcare Providers:  IncredibleEmployment.be  This test is no t yet approved or cleared by the [place] FDA and  has been authorized for detection and/or diagnosis of SARS-CoV-2 by FDA under an Emergency Use Authorization (EUA). This EUA will remain  in effect (meaning this test can be used) for the duration of the COVID-19 declaration under Section 564(b)(1) of the Act, 21 U.S.C.section 360bbb-3(b)(1), unless the authorization is  terminated  or revoked sooner.       Influenza A by PCR NEGATIVE NEGATIVE Final   Influenza B by PCR NEGATIVE NEGATIVE Final    Comment: (NOTE) The Xpert Xpress SARS-CoV-2/FLU/RSV plus assay is intended as an aid in the diagnosis of influenza from Nasopharyngeal swab specimens and should not be used as a sole basis for treatment. Nasal washings  and aspirates are unacceptable for Xpert Xpress SARS-CoV-2/FLU/RSV testing.  Fact Sheet for Patients: EntrepreneurPulse.com.au  Fact Sheet for Healthcare Providers: IncredibleEmployment.be  This test is not yet approved or cleared by the [place] FDA and has been authorized for detection and/or diagnosis of SARS-CoV-2 by FDA under an Emergency Use Authorization (EUA). This EUA will remain in effect (meaning this test can be used) for the duration of the COVID-19 declaration under Section 564(b)(1) of the Act, 21 U.S.C. section 360bbb-3(b)(1), unless the authorization is terminated or revoked.  Performed at [facility], [address], [place], [place] [address]   Blood Culture (routine x 2)     Status: Abnormal   Collection Time: 05/20/21 10:12 PM   Specimen: Right Antecubital; Blood  Result Value Ref Range Status   Specimen Description   Final    RIGHT ANTECUBITAL Performed at [facility], [address] [place], [place] [address]    Special Requests   Final    BOTTLES DRAWN AEROBIC AND ANAEROBIC Blood Culture adequate volume Performed at [facility], [address] [place], [place] [address]    Culture  Setup Time   Final    GRAM POSITIVE COCCI IN BOTH AEROBIC AND ANAEROBIC BOTTLES Gram Stain Report Called to,Read Back By and Verified With: BAIN C @ 1004 ON [id number] BY HENDERSON L Performed at [facility], [address] [name], [place] [address]    Culture (A)  Final    STREPTOCOCCUS GROUP C SUSCEPTIBILITIES PERFORMED ON PREVIOUS CULTURE WITHIN THE LAST 5 DAYS. Performed at [facility], [place] [address], [place], [place] [address]    Report Status 05/23/2021 FINAL  Final  Blood Culture (routine x 2)     Status: Abnormal   Collection Time: 05/20/21 10:12 PM   Specimen: Left Antecubital; Blood  Result Value Ref Range Status   Specimen Description   Final    LEFT ANTECUBITAL Performed at [facility], [address]  [place], [place] [address]    Special Requests   Final    BOTTLES DRAWN AEROBIC AND ANAEROBIC Blood Culture adequate volume Performed at [facility], [address], [place], [place] [address]    Culture  Setup Time   Final    GRAM POSITIVE COCCI IN BOTH AEROBIC AND ANAEROBIC BOTTLES Gram Stain Report Called to,Read Back By and Verified With: BAIN '@1006'  ON [id number] BY HENDERSON L CRITICAL RESULT CALLED TO, READ BACK BY AND VERIFIED WITH: PHARMD G COFFEE [id number] AT 9244 BY CM Performed at [facility], [place] [address] [place], [place] [address]    Culture STREPTOCOCCUS GROUP G (A)  Final   Report Status 05/23/2021 FINAL  Final   Organism ID, Bacteria STREPTOCOCCUS GROUP G  Final      Susceptibility   Streptococcus group g - MIC*    CLINDAMYCIN >=1 RESISTANT Resistant     AMPICILLIN <=0.25 SENSITIVE Sensitive     ERYTHROMYCIN >=8 RESISTANT Resistant     VANCOMYCIN 0.5 SENSITIVE Sensitive     CEFTRIAXONE <=0.12 SENSITIVE Sensitive     LEVOFLOXACIN 0.5 SENSITIVE Sensitive     *  STREPTOCOCCUS GROUP G  Blood Culture ID Panel (Reflexed)     Status: Abnormal   Collection Time: 05/20/21 10:12 PM  Result Value Ref Range Status   Enterococcus faecalis NOT DETECTED NOT DETECTED Final   Enterococcus Faecium NOT DETECTED NOT DETECTED Final   Listeria monocytogenes NOT DETECTED NOT DETECTED Final   Staphylococcus species NOT DETECTED NOT DETECTED Final   Staphylococcus aureus (BCID) NOT DETECTED NOT DETECTED Final   Staphylococcus epidermidis NOT DETECTED NOT DETECTED Final   Staphylococcus lugdunensis NOT DETECTED NOT DETECTED Final   Streptococcus species DETECTED (A) NOT DETECTED Final    Comment: Not Enterococcus species, Streptococcus agalactiae, Streptococcus pyogenes, or Streptococcus pneumoniae. CRITICAL RESULT CALLED TO, READ BACK BY AND VERIFIED WITH: PHARMD G COFFEE [id number] AT 1 BY CM    Streptococcus agalactiae NOT DETECTED NOT DETECTED Final   Streptococcus pneumoniae NOT  DETECTED NOT DETECTED Final   Streptococcus pyogenes NOT DETECTED NOT DETECTED Final   A.calcoaceticus-baumannii NOT DETECTED NOT DETECTED Final   Bacteroides fragilis NOT DETECTED NOT DETECTED Final   Enterobacterales NOT DETECTED NOT DETECTED Final   Enterobacter cloacae complex NOT DETECTED NOT DETECTED Final   Escherichia coli NOT DETECTED NOT DETECTED Final   Klebsiella aerogenes NOT DETECTED NOT DETECTED Final   Klebsiella oxytoca NOT DETECTED NOT DETECTED Final   Klebsiella pneumoniae NOT DETECTED NOT DETECTED Final   Proteus species NOT DETECTED NOT DETECTED Final   Salmonella species NOT DETECTED NOT DETECTED Final   Serratia marcescens NOT DETECTED NOT DETECTED Final   Haemophilus influenzae NOT DETECTED NOT DETECTED Final   Neisseria meningitidis NOT DETECTED NOT DETECTED Final   Pseudomonas aeruginosa NOT DETECTED NOT DETECTED Final   Stenotrophomonas maltophilia NOT DETECTED NOT DETECTED Final   Candida albicans NOT DETECTED NOT DETECTED Final   Candida auris NOT DETECTED NOT DETECTED Final   Candida glabrata NOT DETECTED NOT DETECTED Final   Candida krusei NOT DETECTED NOT DETECTED Final   Candida parapsilosis NOT DETECTED NOT DETECTED Final   Candida tropicalis NOT DETECTED NOT DETECTED Final   Cryptococcus neoformans/gattii NOT DETECTED NOT DETECTED Final    Comment: Performed at [facility], [address] [place], [place] [address]  Urine Culture     Status: None   Collection Time: 05/21/21  4:30 AM   Specimen: In/Out Cath Urine  Result Value Ref Range Status   Specimen Description   Final    IN/OUT CATH URINE Performed at [facility], [address] [place], [place] [address]    Special Requests   Final    NONE Performed at [facility], [address], [place], [place] [address]    Culture   Final    NO GROWTH Performed at [facility], [place] [address] [place], [place] [address]    Report Status 05/22/2021 FINAL  Final  MRSA Next Gen by PCR,  Nasal     Status: None   Collection Time: 05/21/21 10:36 AM   Specimen: Nasal Mucosa; Nasal Swab  Result Value Ref Range Status   MRSA by PCR Next Gen NOT DETECTED NOT DETECTED Final    Comment: (NOTE) The GeneXpert MRSA Assay (FDA approved for NASAL specimens only), is one component of a comprehensive MRSA colonization surveillance program. It is not intended to diagnose MRSA infection nor to guide or monitor treatment for MRSA infections. Test performance is not FDA approved in patients less than 89 years old. Performed at [facility], [address], [place], [place] [address]  Body fluid culture w Gram Stain     Status: None (Preliminary result)   Collection Time: 05/22/21 11:43 AM   Specimen: Synovium; Body Fluid  Result Value Ref Range Status   Specimen Description SYNOVIAL FLUID  Final   Special Requests BURSA  Final   Gram Stain   Final    FEW WBC PRESENT,BOTH PMN AND MONONUCLEAR NO ORGANISMS SEEN    Culture   Final    NO GROWTH 2 DAYS Performed at [facility], [address], [place], [place] [address]    Report Status PENDING  Incomplete    Labs: CBC: Recent Labs  Lab 05/20/21 2212 05/21/21 0541 05/22/21 0419 05/23/21 0541 05/25/21 0457  WBC 21.2* 11.6* 16.5* 16.2* 8.5  NEUTROABS 19.6* 10.1*  --   --   --   HGB 12.7 10.9* 10.5* 10.2* 10.7*  HCT 43.1 35.8* 35.2* 33.1* 35.8*  MCV 90.0 88.2 86.5 86.9 88.0  PLT 303 234 252 222 585   Basic Metabolic Panel: Recent Labs  Lab 05/20/21 2212 05/21/21 0541 05/22/21 0419 05/23/21 0541 05/24/21 0512 05/25/21 0457  NA 140 139 143 139  --  142  K 3.2* 3.7 3.0* 3.4*  --  3.1*  CL 99 103 102 104  --  107  CO2 '22 23 26 26  ' --  27  GLUCOSE 75 96 118* 92  --  114*  BUN 21 20 25* 30*  --  21  CREATININE 1.05* 1.02* 0.86 1.12* 0.99 0.74  CALCIUM 8.4* 7.7* 7.8* 7.6*  --  8.2*  MG 1.2* 1.3* 2.5*  --   --  2.0   Liver Function Tests: Recent Labs  Lab 05/20/21 2212 05/21/21 0541  AST 54* 285*  ALT 19 78*   ALKPHOS 149* 106  BILITOT 1.4* 0.9  PROT 7.1 5.4*  ALBUMIN 3.4* 2.5*   CBG: Recent Labs  Lab 05/24/21 0810 05/24/21 1127 05/24/21 1613 05/24/21 2119 05/25/21 0734  GLUCAP 88 114* 134* 101* 100*    Discharge time spent: greater than 30 minutes.  Signed: Barton Dubois, MD [facility] 05/25/2021

## 2021-05-25 NOTE — Care Management Important Message (Signed)
Important Message  Patient Details  Name: [name] MRN: [id number] Date of Birth: 1945/09/11   Medicare Important Message Given:  Yes     Tommy Medal 05/25/2021, 11:31 AM

## 2021-06-09 ENCOUNTER — Encounter (HOSPITAL_BASED_OUTPATIENT_CLINIC_OR_DEPARTMENT_OTHER): Payer: 59 | Attending: Physician Assistant | Admitting: General Surgery

## 2021-06-09 ENCOUNTER — Other Ambulatory Visit: Payer: Self-pay

## 2021-06-09 DIAGNOSIS — I482 Chronic atrial fibrillation, unspecified: Secondary | ICD-10-CM | POA: Insufficient documentation

## 2021-06-09 DIAGNOSIS — E1151 Type 2 diabetes mellitus with diabetic peripheral angiopathy without gangrene: Secondary | ICD-10-CM | POA: Insufficient documentation

## 2021-06-09 DIAGNOSIS — L97526 Non-pressure chronic ulcer of other part of left foot with bone involvement without evidence of necrosis: Secondary | ICD-10-CM | POA: Diagnosis not present

## 2021-06-09 DIAGNOSIS — I110 Hypertensive heart disease with heart failure: Secondary | ICD-10-CM | POA: Insufficient documentation

## 2021-06-09 DIAGNOSIS — I87312 Chronic venous hypertension (idiopathic) with ulcer of left lower extremity: Secondary | ICD-10-CM | POA: Insufficient documentation

## 2021-06-09 DIAGNOSIS — Z8673 Personal history of transient ischemic attack (TIA), and cerebral infarction without residual deficits: Secondary | ICD-10-CM | POA: Diagnosis not present

## 2021-06-09 DIAGNOSIS — I5032 Chronic diastolic (congestive) heart failure: Secondary | ICD-10-CM | POA: Insufficient documentation

## 2021-06-09 DIAGNOSIS — L84 Corns and callosities: Secondary | ICD-10-CM | POA: Diagnosis not present

## 2021-06-09 DIAGNOSIS — I10 Essential (primary) hypertension: Secondary | ICD-10-CM | POA: Insufficient documentation

## 2021-06-09 DIAGNOSIS — E11621 Type 2 diabetes mellitus with foot ulcer: Secondary | ICD-10-CM | POA: Insufficient documentation

## 2021-06-09 NOTE — Progress Notes (Signed)
[name], Idabell L. ([id number]) ?Visit Report for 06/09/2021 ?Abuse Risk Screen Details ?Patient Name: Date of Service: ?KAMMERER, [name] 06/09/2021 9:00 A M ?Medical Record Number: [id number] ?Patient Account Number: [id number] ?Date of Birth/Sex: Treating RN: ?06-03-1945 (76 y.o. Female) ?Primary Care Jahkeem Kurka: Bing Matter Other Clinician: ?Referring Lawrance Wiedemann: ?Treating Lynnwood Beckford/Extender: Fredirick Maudlin ?Weeks in Treatment: 0 ?Abuse Risk Screen Items ?Answer ?ABUSE RISK SCREEN: ?Has anyone close to you tried to hurt or harm you recentlyo No ?Do you feel uncomfortable with anyone in your familyo No ?Has anyone forced you do things that you didnt want to doo No ?Electronic Signature(s) ?Signed: 06/09/2021 9:49:12 AM By: Sandre Kitty ?Entered By: Sandre Kitty on 06/09/2021 08:30:21 ?-------------------------------------------------------------------------------- ?Activities of Daily Living Details ?Patient Name: Date of Service: ?[name], [name] 06/09/2021 9:00 A M ?Medical Record Number: [id number] ?Patient Account Number: [id number] ?Date of Birth/Sex: Treating RN: ?Oct 31, 1945 (76 y.o. Female) ?Primary Care Yazmyne Sara: Bing Matter Other Clinician: ?Referring Matilynn Dacey: ?Treating Keanan Melander/Extender: Fredirick Maudlin ?Weeks in Treatment: 0 ?Activities of Daily Living Items ?Answer ?Activities of Daily Living (Please select one for each item) ?Drive Automobile Not Able ?T Medications ?ake Need Assistance ?Use T elephone Need Assistance ?Care for Appearance Need Assistance ?Use T oilet Need Assistance ?Bath / Shower Need Assistance ?Dress Self Need Assistance ?Feed Self Completely Able ?Walk Not Able ?Get In / Out Bed Need Assistance ?Housework Not Able ?Prepare Meals Not Able ?Handle Money Need Assistance ?Shop for Self Not Able ?Electronic Signature(s) ?Signed: 06/09/2021 9:49:12 AM By: Sandre Kitty ?Entered By: Sandre Kitty on 06/09/2021  08:31:44 ?-------------------------------------------------------------------------------- ?Education Screening Details ?Patient Name: ?Date of Service: ?[name], [name] 06/09/2021 9:00 A M ?Medical Record Number: [id number] ?Patient Account Number: [id number] ?Date of Birth/Sex: ?Treating RN: ?10/30/1945 (76 y.o. Female) ?Primary Care Eriq Hufford: Bing Matter ?Other Clinician: ?Referring Marg Macmaster: ?Treating Gibran Veselka/Extender: Fredirick Maudlin ?Weeks in Treatment: 0 ?Primary Learner Assessed: Patient ?Learning Preferences/Education Level/Primary Language ?Learning Preference: Explanation, Demonstration, Printed Material ?Highest Education Level: High School ?Preferred Language: English ?Cognitive Barrier ?Language Barrier: No ?Translator Needed: No ?Memory Deficit: Yes alzheimers ?Emotional Barrier: No ?Cultural/Religious Beliefs Affecting Medical Care: No ?Physical Barrier ?Impaired Vision: Yes Glasses ?Impaired Hearing: Yes Hearing Aid ?Decreased Hand dexterity: Yes ?Limitations: Rheumatoid arthritis ?Knowledge/Comprehension ?Knowledge Level: Medium ?Comprehension Level: Medium ?Ability to understand written instructions: Medium ?Ability to understand verbal instructions: Medium ?Motivation ?Anxiety Level: Calm ?Cooperation: Cooperative ?Education Importance: Acknowledges Need ?Interest in Health Problems: Asks Questions ?Perception: Confused ?Willingness to Engage in Self-Management High ?Activities: ?Readiness to Engage in Self-Management High ?Activities: ?Electronic Signature(s) ?Signed: 06/09/2021 9:49:12 AM By: Sandre Kitty ?Entered By: Sandre Kitty on 06/09/2021 08:34:09 ?-------------------------------------------------------------------------------- ?Fall Risk Assessment Details ?Patient Name: ?Date of Service: ?[name], [name] 06/09/2021 9:00 A M ?Medical Record Number: [id number] ?Patient Account Number: [id number] ?Date of Birth/Sex: ?Treating RN: ?05/16/1945 (76 y.o. Female) ?Primary Care Ares Tegtmeyer:  Bing Matter ?Other Clinician: ?Referring Rane Blitch: ?Treating Tramane Gorum/Extender: Fredirick Maudlin ?Weeks in Treatment: 0 ?Fall Risk Assessment Items ?Have you had 2 or more falls in the last 12 monthso 0 Yes ?Have you had any fall that resulted in injury in the last 12 monthso 0 No ?FALLS RISK SCREEN ?History of falling - immediate or within 3 months 25 Yes ?Secondary diagnosis (Do you have 2 or more medical diagnoseso) 0 No ?Ambulatory aid ?None/bed rest/wheelchair/nurse 0 No ?Crutches/cane/walker 15 Yes ?Furniture 0 No ?Intravenous therapy Access/Saline/Heparin Lock 0 No ?Gait/Transferring ?Normal/ bed rest/ wheelchair 0 No ?Weak (short steps with or without shuffle, stooped but able to lift head while walking,  may seek 0 No ?support from furniture) ?Impaired (short steps with shuffle, may have difficulty arising from chair, head down, impaired 20 Yes ?balance) ?Mental Status ?Oriented to own ability 0 Yes ?Electronic Signature(s) ?Signed: 06/09/2021 9:49:12 AM By: Sandre Kitty ?Entered By: Sandre Kitty on 06/09/2021 08:36:36 ?-------------------------------------------------------------------------------- ?Foot Assessment Details ?Patient Name: ?Date of Service: ?[name], [name] 06/09/2021 9:00 A M ?Medical Record Number: [id number] ?Patient Account Number: [id number] ?Date of Birth/Sex: ?Treating RN: ?[birth year] (76 y.o. Female) ?Primary Care Finas Delone: Bing Matter ?Other Clinician: ?Referring Krissie Merrick: ?Treating Leeanne Butters/Extender: Fredirick Maudlin ?Weeks in Treatment: 0 ?Foot Assessment Items ?Site Locations ?+ = Sensation present, - = Sensation absent, C = Callus, U = Ulcer ?R = Redness, W = Warmth, M = Maceration, PU = Pre-ulcerative lesion ?F = Fissure, S = Swelling, D = Dryness ?Assessment ?Right: Left: ?Other Deformity: No No ?Prior Foot Ulcer: No No ?Prior Amputation: No No ?Charcot Joint: No No ?Ambulatory Status: ?Gait: ?Electronic Signature(s) ?Signed: 06/09/2021 9:49:12 AM By: Sandre Kitty ?Entered By: Sandre Kitty on 06/09/2021 08:40:24 ?-------------------------------------------------------------------------------- ?Nutrition Risk Screening Details ?Patient Name: ?Date of Service: ?[name], [name] 06/09/2021 9:00 A M ?Medical Record Number: [id number] ?Patient Account Number: [id number] ?Date of Birth/Sex: ?Treating RN: ?[birth year] (76 y.o. Female) ?Primary Care Mase Dhondt: Bing Matter ?Other Clinician: ?Referring Joleah Kosak: ?Treating Merrell Rettinger/Extender: Fredirick Maudlin ?Weeks in Treatment: 0 ?Height (in): 67 ?Weight (lbs): 183 ?Body Mass Index (BMI): 28.7 ?Nutrition Risk Screening Items ?Score Screening ?NUTRITION RISK SCREEN: ?I have an illness or condition that made me change the kind and/or amount of food I eat 0 No ?I eat fewer than two meals per day 0 No ?I eat few fruits and vegetables, or milk products 0 No ?I have three or more drinks of beer, liquor or wine almost every day 0 No ?I have tooth or mouth problems that make it hard for me to eat 0 No ?I don't always have enough money to buy the food I need 0 No ?I eat alone most of the time 0 No ?I take three or more different prescribed or over-the-counter drugs a day 0 No ?Without wanting to, I have lost or gained 10 pounds in the last six months 0 No ?I am not always physically able to shop, cook and/or feed myself 0 No ?Nutrition Protocols ?Good Risk Protocol ?Moderate Risk Protocol ?High Risk Proctocol ?Risk Level: Good Risk ?Score: 0 ?Electronic Signature(s) ?Signed: 06/09/2021 9:49:12 AM By: Sandre Kitty ?Entered By: Sandre Kitty on 06/09/2021 08:37:09 ?

## 2021-06-09 NOTE — Progress Notes (Signed)
ANIJAH, SPOHR ([id number]) Visit Report for 06/09/2021 Chief Complaint Document Details Patient Name: Date of Service: ABDELRAHMAN, [name] 06/09/2021 9:00 A M Medical Record Number: [id number] Patient Account Number: [id number] Date of Birth/Sex: Treating RN: [birth year] (76 y.o. F) Primary Care Provider: Bing Matter Other Clinician: Referring Provider: Treating Provider/Extender: Donia Ast in Treatment: 0 Information Obtained from: Patient Chief Complaint 06/09/2021: Patient is here with multiple lower extremity ulcers on her shins and calves, as well as an open blister on her left heel, ulcers on her third and fourth toes and first metatarsal head on the left foot. She also has an area of reddened skin on her buttock, but this is not open. Electronic Signature(s) Signed: 06/09/2021 12:07:48 PM By: Fredirick Maudlin MD FACS Entered By: Fredirick Maudlin on 06/09/2021 12:07:48 -------------------------------------------------------------------------------- Debridement Details Patient Name: Date of Service: [name]. 06/09/2021 9:00 A M Medical Record Number: [id number] Patient Account Number: [id number] Date of Birth/Sex: Treating RN: 1945/07/10 (76 y.o. [name] Primary Care Provider: Bing Matter Other Clinician: Referring Provider: Treating Provider/Extender: Donia Ast in Treatment: 0 Debridement Performed for Assessment: Wound #1 Right,Anterior Lower Leg Performed By: Physician Fredirick Maudlin, MD Debridement Type: Debridement Severity of Tissue Pre Debridement: Fat layer exposed Level of Consciousness (Pre-procedure): Awake and Alert Pre-procedure Verification/Time Out Yes - 09:55 Taken: Start Time: 09:55 T Area Debrided (L x W): otal 1.6 (cm) x 1 (cm) = 1.6 (cm) Tissue and other material debrided: Non-Viable, Slough, Slough Level: Non-Viable Tissue Debridement Description: Selective/Open  Wound Instrument: Curette Bleeding: Minimum Hemostasis Achieved: Pressure End Time: 10:00 Procedural Pain: 0 Post Procedural Pain: 0 Response to Treatment: Procedure was tolerated well Level of Consciousness (Post- Awake and Alert procedure): Post Debridement Measurements of Total Wound Length: (cm) 1.6 Width: (cm) 1 Depth: (cm) 0.1 Volume: (cm) 0.126 Character of Wound/Ulcer Post Debridement: Requires Further Debridement Severity of Tissue Post Debridement: Fat layer exposed Post Procedure Diagnosis Same as Pre-procedure Electronic Signature(s) Signed: 06/09/2021 12:45:07 PM By: Fredirick Maudlin MD FACS Signed: 06/09/2021 6:27:47 PM By: Levan Hurst RN, BSN Entered By: Levan Hurst on 06/09/2021 09:55:56 -------------------------------------------------------------------------------- Debridement Details Patient Name: Date of Service: Gerlean Ren, [place] TSY L. 06/09/2021 9:00 A M Medical Record Number: [id number] Patient Account Number: [id number] Date of Birth/Sex: Treating RN: 03/19/1946 (76 y.o. [name] Primary Care Provider: Bing Matter Other Clinician: Referring Provider: Treating Provider/Extender: Donia Ast in Treatment: 0 Debridement Performed for Assessment: Wound #4 Left,Medial Lower Leg Performed By: Physician Fredirick Maudlin, MD Debridement Type: Debridement Severity of Tissue Pre Debridement: Fat layer exposed Level of Consciousness (Pre-procedure): Awake and Alert Pre-procedure Verification/Time Out Yes - 09:55 Taken: Start Time: 09:55 T Area Debrided (L x W): otal 0.5 (cm) x 0.5 (cm) = 0.25 (cm) Tissue and other material debrided: Non-Viable, Slough, Slough Level: Non-Viable Tissue Debridement Description: Selective/Open Wound Instrument: Curette Bleeding: Minimum Hemostasis Achieved: Pressure End Time: 10:00 Procedural Pain: 0 Post Procedural Pain: 0 Response to Treatment: Procedure was tolerated well Level of  Consciousness (Post- Awake and Alert procedure): Post Debridement Measurements of Total Wound Length: (cm) 0.5 Width: (cm) 0.5 Depth: (cm) 0.1 Volume: (cm) 0.02 Character of Wound/Ulcer Post Debridement: Requires Further Debridement Severity of Tissue Post Debridement: Fat layer exposed Post Procedure Diagnosis Same as Pre-procedure Electronic Signature(s) Signed: 06/09/2021 12:45:07 PM By: Fredirick Maudlin MD FACS Signed: 06/09/2021 6:27:47 PM By: Levan Hurst RN, BSN Entered By: Levan Hurst on 06/09/2021 09:56:50 -------------------------------------------------------------------------------- Debridement Details Patient Name: Date  of Service: LENIG, [name] 06/09/2021 9:00 A M Medical Record Number: [id number] Patient Account Number: [id number] Date of Birth/Sex: Treating RN: 05-30-1945 (76 y.o. [name] Primary Care Provider: Bing Matter Other Clinician: Referring Provider: Treating Provider/Extender: Donia Ast in Treatment: 0 Debridement Performed for Assessment: Wound #3 Left,Anterior Lower Leg Performed By: Physician Fredirick Maudlin, MD Debridement Type: Debridement Severity of Tissue Pre Debridement: Fat layer exposed Level of Consciousness (Pre-procedure): Awake and Alert Pre-procedure Verification/Time Out Yes - 09:55 Taken: Start Time: 09:55 T Area Debrided (L x W): otal 0.5 (cm) x 0.5 (cm) = 0.25 (cm) Tissue and other material debrided: Non-Viable, Slough, Slough Level: Non-Viable Tissue Debridement Description: Selective/Open Wound Instrument: Curette Bleeding: Minimum Hemostasis Achieved: Pressure End Time: 10:00 Procedural Pain: 0 Post Procedural Pain: 0 Response to Treatment: Procedure was tolerated well Level of Consciousness (Post- Awake and Alert procedure): Post Debridement Measurements of Total Wound Length: (cm) 0.5 Width: (cm) 0.5 Depth: (cm) 0.1 Volume: (cm) 0.02 Character of Wound/Ulcer Post  Debridement: Requires Further Debridement Severity of Tissue Post Debridement: Fat layer exposed Post Procedure Diagnosis Same as Pre-procedure Electronic Signature(s) Signed: 06/09/2021 12:45:07 PM By: Fredirick Maudlin MD FACS Signed: 06/09/2021 6:27:47 PM By: Levan Hurst RN, BSN Entered By: Levan Hurst on 06/09/2021 09:57:17 -------------------------------------------------------------------------------- Debridement Details Patient Name: Date of Service: Gerlean Ren, PA TSY L. 06/09/2021 9:00 A M Medical Record Number: [id number] Patient Account Number: [id number] Date of Birth/Sex: Treating RN: 1945-12-23 (76 y.o. [name] Primary Care Provider: Bing Matter Other Clinician: Referring Provider: Treating Provider/Extender: Donia Ast in Treatment: 0 Debridement Performed for Assessment: Wound #6 Left Metatarsal head first Performed By: Physician Fredirick Maudlin, MD Debridement Type: Debridement Severity of Tissue Pre Debridement: Fat layer exposed Level of Consciousness (Pre-procedure): Awake and Alert Pre-procedure Verification/Time Out Yes - 09:55 Taken: Start Time: 09:55 T Area Debrided (L x W): otal 1 (cm) x 1.1 (cm) = 1.1 (cm) Tissue and other material debrided: Non-Viable, Slough, Slough Level: Non-Viable Tissue Debridement Description: Selective/Open Wound Instrument: Curette Bleeding: Minimum Hemostasis Achieved: Pressure End Time: 10:00 Procedural Pain: 0 Post Procedural Pain: 0 Response to Treatment: Procedure was tolerated well Level of Consciousness (Post- Awake and Alert procedure): Post Debridement Measurements of Total Wound Length: (cm) 1 Width: (cm) 1.1 Depth: (cm) 0.1 Volume: (cm) 0.086 Character of Wound/Ulcer Post Debridement: Requires Further Debridement Severity of Tissue Post Debridement: Fat layer exposed Post Procedure Diagnosis Same as Pre-procedure Electronic Signature(s) Signed: 06/09/2021 12:45:07  PM By: Fredirick Maudlin MD FACS Signed: 06/09/2021 6:27:47 PM By: Levan Hurst RN, BSN Entered By: Levan Hurst on 06/09/2021 09:57:48 -------------------------------------------------------------------------------- Debridement Details Patient Name: Date of Service: Gerlean Ren, PA TSY L. 06/09/2021 9:00 A M Medical Record Number: [id number] Patient Account Number: [id number] Date of Birth/Sex: Treating RN: Apr 15, 1945 (76 y.o. [name] Primary Care Provider: Bing Matter Other Clinician: Referring Provider: Treating Provider/Extender: Donia Ast in Treatment: 0 Debridement Performed for Assessment: Wound #5 Left T Third oe Performed By: Physician Fredirick Maudlin, MD Debridement Type: Debridement Severity of Tissue Pre Debridement: Fat layer exposed Level of Consciousness (Pre-procedure): Awake and Alert Pre-procedure Verification/Time Out Yes - 09:55 Taken: Start Time: 09:55 T Area Debrided (L x W): otal 0.8 (cm) x 1 (cm) = 0.8 (cm) Tissue and other material debrided: Non-Viable, Callus, Slough, Slough Level: Non-Viable Tissue Debridement Description: Selective/Open Wound Instrument: Curette Bleeding: Minimum Hemostasis Achieved: Pressure End Time: 10:00 Procedural Pain: 0 Post Procedural Pain: 0 Response to Treatment: Procedure was  tolerated well Level of Consciousness (Post- Awake and Alert procedure): Post Debridement Measurements of Total Wound Length: (cm) 0.8 Width: (cm) 1 Depth: (cm) 0.1 Volume: (cm) 0.063 Character of Wound/Ulcer Post Debridement: Requires Further Debridement Severity of Tissue Post Debridement: Fat layer exposed Post Procedure Diagnosis Same as Pre-procedure Electronic Signature(s) Signed: 06/09/2021 12:45:07 PM By: Fredirick Maudlin MD FACS Signed: 06/09/2021 6:27:47 PM By: Levan Hurst RN, BSN Signed: 06/09/2021 6:27:47 PM By: Levan Hurst RN, BSN Entered By: Levan Hurst on 06/09/2021  09:58:28 -------------------------------------------------------------------------------- Debridement Details Patient Name: Date of Service: Gerlean Ren, [place]. 06/09/2021 9:00 A M Medical Record Number: [id number] Patient Account Number: [id number] Date of Birth/Sex: Treating RN: 02-24-1946 (76 y.o. [name] Primary Care Provider: Bing Matter Other Clinician: Referring Provider: Treating Provider/Extender: Donia Ast in Treatment: 0 Debridement Performed for Assessment: Wound #7 Left T Fourth oe Performed By: Physician Fredirick Maudlin, MD Debridement Type: Debridement Severity of Tissue Pre Debridement: Fat layer exposed Level of Consciousness (Pre-procedure): Awake and Alert Pre-procedure Verification/Time Out Yes - 09:55 Taken: Start Time: 09:55 T Area Debrided (L x W): otal 0.5 (cm) x 0.7 (cm) = 0.35 (cm) Tissue and other material debrided: Non-Viable, Slough, Slough Level: Non-Viable Tissue Debridement Description: Selective/Open Wound Instrument: Curette Bleeding: Minimum Hemostasis Achieved: Pressure End Time: 10:00 Procedural Pain: 0 Post Procedural Pain: 0 Response to Treatment: Procedure was tolerated well Level of Consciousness (Post- Awake and Alert procedure): Post Debridement Measurements of Total Wound Length: (cm) 0.5 Width: (cm) 0.7 Depth: (cm) 0.1 Volume: (cm) 0.027 Character of Wound/Ulcer Post Debridement: Requires Further Debridement Severity of Tissue Post Debridement: Fat layer exposed Post Procedure Diagnosis Same as Pre-procedure Electronic Signature(s) Signed: 06/09/2021 12:45:07 PM By: Fredirick Maudlin MD FACS Signed: 06/09/2021 6:27:47 PM By: Levan Hurst RN, BSN Entered By: Levan Hurst on 06/09/2021 10:02:06 -------------------------------------------------------------------------------- HPI Details Patient Name: Date of Service: Gerlean Ren, PA TSY L. 06/09/2021 9:00 A M Medical Record Number:  [id number] Patient Account Number: [id number] Date of Birth/Sex: Treating RN: April 13, 1945 (76 y.o. F) Primary Care Provider: Bing Matter Other Clinician: Referring Provider: Treating Provider/Extender: Donia Ast in Treatment: 0 History of Present Illness HPI Description: ADMISSION 06/09/2021: This is a 76 year old resident of [place]. She was recently hospitalized with sepsis. She had an iliopsoas abscess that was drained. While in the hospital, multiple wounds were identified. There were scattered superficial partial-thickness ulcerations on both lower extremities from the ankle to the knee. There was concern for a possible sacral pressure ulcer and left posterior thigh pressure ulcer. She also had 3 diabetic foot ulcers on the left foot. These involve the DIP joint of the third toe, the DIP joint of the fourth toe, and the plantar surface of the first metatarsal head. Both toe wounds have exposed bone. They have been applying Santyl at her skilled nursing facility. She is here for further evaluation and management. Her past medical history is notable for history of type 2 diabetes, CVA, stage II CKD, CHF, peripheral vascular disease, history of DVT chronic atrial fibrillation on Xarelto, rheumatoid arthritis. , There has been no imaging performed of her lower extremities. ABIs in clinic today were noncompressible. Hemoglobin A1c on admission to the hospital was acceptable at 5.7%. Electronic Signature(s) Signed: 06/09/2021 12:27:54 PM By: Fredirick Maudlin MD FACS Entered By: Fredirick Maudlin on 06/09/2021 12:27:54 -------------------------------------------------------------------------------- Physical Exam Details Patient Name: Date of Service: [name]. 06/09/2021 9:00 A M Medical Record Number: [id number] Patient Account Number: [id number] Date of Birth/Sex:  Treating RN: 10-31-1945 (76 y.o. F) Primary Care Provider: Bing Matter Other  Clinician: Referring Provider: Treating Provider/Extender: Donia Ast in Treatment: 0 Constitutional Hypertensive. Tachycardic, irregularly irregular. . . No acute distress. Respiratory Normal work of breathing on room air.. Cardiovascular 2+ pitting edema to the bilateral lower extremities. The skin of the left lower extremity is somewhat erythematous.. Notes 06/09/2021: Wound exam: The multiple scattered lesions on the lower extremities from the ankle to the knee are superficial with minimal amount of yellow slough. No surrounding erythema or drainage. The site on her thigh and buttock that had raised concern for possible pressure injury are completely closed and do not even appear consistent with a stage I injury in either site. The most concerning wounds are on her left foot. Heavy eschar and callus covered all 3. After I debrided this off of the third and fourth toes, bone was exposed in both areas. There was exposed fat on the first metatarsal head plantar wound, along with necrotic tissue. All of this was debrided. While we were in clinic, the patient was rubbing her heel on the exam chair and opened up a blister that had been present on her left heel. This appears to be simply denuded skin without any significant depth. Electronic Signature(s) Signed: 06/09/2021 12:35:02 PM By: Fredirick Maudlin MD FACS Previous Signature: 06/09/2021 12:33:33 PM Version By: Fredirick Maudlin MD FACS Entered By: Fredirick Maudlin on 06/09/2021 12:35:02 -------------------------------------------------------------------------------- Physician Orders Details Patient Name: Date of Service: [name]. 06/09/2021 9:00 A M Medical Record Number: [id number] Patient Account Number: [id number] Date of Birth/Sex: Treating RN: 02-22-1946 (76 y.o. [name] Primary Care Provider: Bing Matter Other Clinician: Referring Provider: Treating Provider/Extender: Donia Ast in Treatment: 0 Verbal / Phone Orders: No Diagnosis Coding ICD-10 Coding Code Description E11.621 Type 2 diabetes mellitus with foot ulcer E11.622 Type 2 diabetes mellitus with other skin ulcer L97.526 Non-pressure chronic ulcer of other part of left foot with bone involvement without evidence of necrosis I73.9 Peripheral vascular disease, unspecified I87.311 Chronic venous hypertension (idiopathic) with ulcer of right lower extremity I87.312 Chronic venous hypertension (idiopathic) with ulcer of left lower extremity I10 Essential (primary) hypertension P38.25 Chronic diastolic (congestive) heart failure I48.20 Chronic atrial fibrillation, unspecified I63.9 Cerebral infarction, unspecified Follow-up Appointments ppointment in 1 week. - Dr. Celine Ahr Return A Bathing/ Shower/ Hygiene May shower with protection but do not get wound dressing(s) wet. Edema Control - Lymphedema / SCD / Other Elevate legs to the level of the heart or above for 30 minutes daily and/or when sitting, a frequency of: - throughout the day Moisturize legs daily. Off-Loading Open toe surgical shoe to: - left foot Wound Treatment Wound #1 - Lower Leg Wound Laterality: Right, Anterior Cleanser: Soap and Water Every Other Day/7 Days Discharge Instructions: May shower and wash wound with dial antibacterial soap and water prior to dressing change. Cleanser: Wound Cleanser Every Other Day/7 Days Discharge Instructions: Cleanse the wound with wound cleanser prior to applying a clean dressing using gauze sponges, not tissue or cotton balls. Peri-Wound Care: Sween Lotion (Moisturizing lotion) Every Other Day/7 Days Discharge Instructions: Apply moisturizing lotion as directed Prim Dressing: KerraCel Ag Gelling Fiber Dressing, 2x2 in (silver alginate) Every Other Day/7 Days ary Discharge Instructions: Apply silver alginate to wound bed as instructed Secondary Dressing: Woven Gauze Sponge,  Non-Sterile 4x4 in Every Other Day/7 Days Discharge Instructions: Apply over primary dressing as directed. Compression Wrap: Kerlix Roll 4.5x3.1 (in/yd) Every Other  Day/7 Days Discharge Instructions: Apply Kerlix and Coban compression as directed. Compression Wrap: Coban Self-Adherent Wrap 4x5 (in/yd) Every Other Day/7 Days Discharge Instructions: Apply over Kerlix as directed. Wound #2 - Lower Leg Wound Laterality: Right, Posterior Cleanser: Soap and Water Every Other Day/7 Days Discharge Instructions: May shower and wash wound with dial antibacterial soap and water prior to dressing change. Cleanser: Wound Cleanser Every Other Day/7 Days Discharge Instructions: Cleanse the wound with wound cleanser prior to applying a clean dressing using gauze sponges, not tissue or cotton balls. Peri-Wound Care: Sween Lotion (Moisturizing lotion) Every Other Day/7 Days Discharge Instructions: Apply moisturizing lotion as directed Prim Dressing: KerraCel Ag Gelling Fiber Dressing, 2x2 in (silver alginate) Every Other Day/7 Days ary Discharge Instructions: Apply silver alginate to wound bed as instructed Secondary Dressing: Woven Gauze Sponge, Non-Sterile 4x4 in Every Other Day/7 Days Discharge Instructions: Apply over primary dressing as directed. Compression Wrap: Kerlix Roll 4.5x3.1 (in/yd) Every Other Day/7 Days Discharge Instructions: Apply Kerlix and Coban compression as directed. Compression Wrap: Coban Self-Adherent Wrap 4x5 (in/yd) Every Other Day/7 Days Discharge Instructions: Apply over Kerlix as directed. Wound #3 - Lower Leg Wound Laterality: Left, Anterior Cleanser: Soap and Water Every Other Day/7 Days Discharge Instructions: May shower and wash wound with dial antibacterial soap and water prior to dressing change. Cleanser: Wound Cleanser Every Other Day/7 Days Discharge Instructions: Cleanse the wound with wound cleanser prior to applying a clean dressing using gauze sponges, not tissue  or cotton balls. Peri-Wound Care: Sween Lotion (Moisturizing lotion) Every Other Day/7 Days Discharge Instructions: Apply moisturizing lotion as directed Prim Dressing: KerraCel Ag Gelling Fiber Dressing, 2x2 in (silver alginate) Every Other Day/7 Days ary Discharge Instructions: Apply silver alginate to wound bed as instructed Secondary Dressing: Woven Gauze Sponge, Non-Sterile 4x4 in Every Other Day/7 Days Discharge Instructions: Apply over primary dressing as directed. Compression Wrap: Kerlix Roll 4.5x3.1 (in/yd) Every Other Day/7 Days Discharge Instructions: Apply Kerlix and Coban compression as directed. Compression Wrap: Coban Self-Adherent Wrap 4x5 (in/yd) Every Other Day/7 Days Discharge Instructions: Apply over Kerlix as directed. Wound #4 - Lower Leg Wound Laterality: Left, Medial Cleanser: Soap and Water Every Other Day/7 Days Discharge Instructions: May shower and wash wound with dial antibacterial soap and water prior to dressing change. Cleanser: Wound Cleanser Every Other Day/7 Days Discharge Instructions: Cleanse the wound with wound cleanser prior to applying a clean dressing using gauze sponges, not tissue or cotton balls. Peri-Wound Care: Sween Lotion (Moisturizing lotion) Every Other Day/7 Days Discharge Instructions: Apply moisturizing lotion as directed Prim Dressing: KerraCel Ag Gelling Fiber Dressing, 2x2 in (silver alginate) Every Other Day/7 Days ary Discharge Instructions: Apply silver alginate to wound bed as instructed Secondary Dressing: Woven Gauze Sponge, Non-Sterile 4x4 in Every Other Day/7 Days Discharge Instructions: Apply over primary dressing as directed. Compression Wrap: Kerlix Roll 4.5x3.1 (in/yd) Every Other Day/7 Days Discharge Instructions: Apply Kerlix and Coban compression as directed. Compression Wrap: Coban Self-Adherent Wrap 4x5 (in/yd) Every Other Day/7 Days Discharge Instructions: Apply over Kerlix as directed. Wound #5 - T Third oe  Wound Laterality: Left Cleanser: Wound Cleanser Every Other Day/7 Days Discharge Instructions: Cleanse the wound with wound cleanser prior to applying a clean dressing using gauze sponges, not tissue or cotton balls. Prim Dressing: Promogran Prisma Matrix, 4.34 (sq in) (silver collagen) Every Other Day/7 Days ary Discharge Instructions: Moisten collagen with saline or hydrogel Secondary Dressing: Woven Gauze Sponges 2x2 in Every Other Day/7 Days Discharge Instructions: Apply over primary dressing as directed.  Secured With: Child psychotherapist, Sterile 2x75 (in/in) Every Other Day/7 Days Discharge Instructions: Secure with stretch gauze as directed. Secured With: Paper Tape, 1x10 (in/yd) Every Other Day/7 Days Discharge Instructions: Secure dressing with tape as directed. Wound #6 - Metatarsal head first Wound Laterality: Left Cleanser: Soap and Water Every Other Day/7 Days Discharge Instructions: May shower and wash wound with dial antibacterial soap and water prior to dressing change. Cleanser: Wound Cleanser Every Other Day/7 Days Discharge Instructions: Cleanse the wound with wound cleanser prior to applying a clean dressing using gauze sponges, not tissue or cotton balls. Peri-Wound Care: Sween Lotion (Moisturizing lotion) Every Other Day/7 Days Discharge Instructions: Apply moisturizing lotion as directed Prim Dressing: Promogran Prisma Matrix, 4.34 (sq in) (silver collagen) Every Other Day/7 Days ary Discharge Instructions: Moisten collagen with saline or hydrogel Secondary Dressing: Woven Gauze Sponge, Non-Sterile 4x4 in Every Other Day/7 Days Discharge Instructions: Apply over primary dressing as directed. Compression Wrap: Kerlix Roll 4.5x3.1 (in/yd) Every Other Day/7 Days Discharge Instructions: Apply Kerlix and Coban compression as directed. Compression Wrap: Coban Self-Adherent Wrap 4x5 (in/yd) Every Other Day/7 Days Discharge Instructions: Apply over Kerlix as  directed. Wound #7 - T Fourth oe Wound Laterality: Left Cleanser: Wound Cleanser Every Other Day/7 Days Discharge Instructions: Cleanse the wound with wound cleanser prior to applying a clean dressing using gauze sponges, not tissue or cotton balls. Prim Dressing: Promogran Prisma Matrix, 4.34 (sq in) (silver collagen) Every Other Day/7 Days ary Discharge Instructions: Moisten collagen with saline or hydrogel Secondary Dressing: Woven Gauze Sponges 2x2 in Every Other Day/7 Days Discharge Instructions: Apply over primary dressing as directed. Secured With: Child psychotherapist, Sterile 2x75 (in/in) Every Other Day/7 Days Discharge Instructions: Secure with stretch gauze as directed. Secured With: Paper Tape, 1x10 (in/yd) Every Other Day/7 Days Discharge Instructions: Secure dressing with tape as directed. Wound #8 - Calcaneus Wound Laterality: Left Cleanser: Soap and Water Every Other Day/7 Days Discharge Instructions: May shower and wash wound with dial antibacterial soap and water prior to dressing change. Cleanser: Wound Cleanser Every Other Day/7 Days Discharge Instructions: Cleanse the wound with wound cleanser prior to applying a clean dressing using gauze sponges, not tissue or cotton balls. Peri-Wound Care: Sween Lotion (Moisturizing lotion) Every Other Day/7 Days Discharge Instructions: Apply moisturizing lotion as directed Prim Dressing: KerraCel Ag Gelling Fiber Dressing, 2x2 in (silver alginate) Every Other Day/7 Days ary Discharge Instructions: Apply silver alginate to wound bed as instructed Secondary Dressing: Woven Gauze Sponge, Non-Sterile 4x4 in Every Other Day/7 Days Discharge Instructions: Apply over primary dressing as directed. Secondary Dressing: ALLEVYN Heel 4 1/2in x 5 1/2in / 10.5cm x 13.5cm Every Other Day/7 Days Discharge Instructions: Apply over primary dressing as directed. Compression Wrap: Kerlix Roll 4.5x3.1 (in/yd) Every Other Day/7  Days Discharge Instructions: Apply Kerlix and Coban compression as directed. Compression Wrap: Coban Self-Adherent Wrap 4x5 (in/yd) Every Other Day/7 Days Discharge Instructions: Apply over Kerlix as directed. Radiology X-ray, left foot, complete view - Non healing wounds on left plantar foot, 3rd and 4th toe, with exposed bone. ICD-10: E11.621 Services and Therapies rterial Studies- Bilateral w/TBIs - Non healing wounds on bilateral lower extremities, ABIs non compressible in clinic. Wound clinic will refer to A Vascular and Vein Specialist. Electronic Signature(s) Signed: 06/09/2021 12:45:07 PM By: Fredirick Maudlin MD FACS Entered By: Fredirick Maudlin on 06/09/2021 12:35:39 Prescription 06/09/2021 -------------------------------------------------------------------------------- Vivianne Master MD Patient Name: Provider: Sep 29, 1945 [id number] Date of Birth: NPI#: [id number] Sex: DEA #:  [phone] Phone #: License #: [place] Patient Address: [address] [place], [place] [address] [place], [place] [address] [phone] Allergies promethazine Provider's Orders rterial Studies- Bilateral w/TBIs - Non healing wounds on bilateral lower extremities, ABIs non compressible in clinic. Wound clinic will refer to A Vascular and Vein Specialist. Hand Signature: Date(s): Prescription 06/09/2021 Vivianne Master MD Patient Name: Provider: 06/29/1945 [id number] Date of Birth: NPI#Wanda Plump [id number] Sex: DEA #: [phone] Phone #: License #: [place] Patient Address: [address] [place], [place] [address] [place], [place] [address] [phone] Allergies promethazine Provider's Orders X-ray, left foot, complete view - Non healing wounds on left plantar foot, 3rd and 4th toe, with exposed bone.  ICD-10: E11.621 Hand Signature: Date(s): Electronic Signature(s) Signed: 06/09/2021 12:44:17 PM By: Fredirick Maudlin MD FACS Entered By: Fredirick Maudlin on 06/09/2021 12:44:17 -------------------------------------------------------------------------------- Problem List Details Patient Name: Date of Service: [name]. 06/09/2021 9:00 A M Medical Record Number: [id number] Patient Account Number: [id number] Date of Birth/Sex: Treating RN: Nov 14, 1945 (76 y.o. F) Primary Care Provider: Bing Matter Other Clinician: Referring Provider: Treating Provider/Extender: Donia Ast in Treatment: 0 Active Problems ICD-10 Encounter Code Description Active Date MDM Diagnosis E11.621 Type 2 diabetes mellitus with foot ulcer 06/09/2021 No Yes E11.622 Type 2 diabetes mellitus with other skin ulcer 06/09/2021 No Yes L97.526 Non-pressure chronic ulcer of other part of left foot with bone involvement 06/09/2021 No Yes without evidence of necrosis I73.9 Peripheral vascular disease, unspecified 06/09/2021 No Yes I87.311 Chronic venous hypertension (idiopathic) with ulcer of right lower extremity 06/09/2021 No Yes I87.312 Chronic venous hypertension (idiopathic) with ulcer of left lower extremity 06/09/2021 No Yes I10 Essential (primary) hypertension 06/09/2021 No Yes I45.80 Chronic diastolic (congestive) heart failure 06/09/2021 No Yes I48.20 Chronic atrial fibrillation, unspecified 06/09/2021 No Yes I63.9 Cerebral infarction, unspecified 06/09/2021 No Yes Inactive Problems Resolved Problems Electronic Signature(s) Signed: 06/09/2021 12:03:36 PM By: Fredirick Maudlin MD FACS Previous Signature: 06/09/2021 10:58:44 AM Version By: Fredirick Maudlin MD FACS Entered By: Fredirick Maudlin on 06/09/2021 12:03:36 -------------------------------------------------------------------------------- Progress Note Details Patient Name: Date of Service: Gerlean Ren, [place] L. 06/09/2021 9:00 [place] Record  Number: [id number] Patient Account Number: [id number] Date of Birth/Sex: Treating RN: 15-Mar-1946 (76 y.o. F) Primary Care Provider: Bing Matter Other Clinician: Referring Provider: Treating Provider/Extender: Donia Ast in Treatment: 0 Subjective Chief Complaint Information obtained from Patient 06/09/2021: Patient is here with multiple lower extremity ulcers on her shins and calves, as well as an open blister on her left heel, ulcers on her third and fourth toes and first metatarsal head on the left foot. She also has an area of reddened skin on her buttock, but this is not open. History of Present Illness (HPI) ADMISSION 06/09/2021: This is a 76 year old resident of [facility]. She was recently hospitalized with sepsis. She had an iliopsoas abscess that was drained. While in the hospital, multiple wounds were identified. There were scattered superficial partial-thickness ulcerations on both lower extremities from the ankle to the knee. There was concern for a possible sacral pressure ulcer and left posterior thigh pressure ulcer. She also had 3 diabetic foot ulcers on the left foot. These involve the DIP joint of the third toe, the DIP joint of the fourth toe, and the plantar surface of the first metatarsal head. Both toe wounds have  exposed bone. They have been applying Santyl at her skilled nursing facility. She is here for further evaluation and management. Her past medical history is notable for history of type 2 diabetes, CVA, stage II CKD, CHF, peripheral vascular disease, history of DVT chronic atrial fibrillation on Xarelto, rheumatoid arthritis. , There has been no imaging performed of her lower extremities. ABIs in clinic today were noncompressible. Hemoglobin A1c on admission to the hospital was acceptable at 5.7%. Patient History Unable to Obtain Patient History due to Dementia. Information obtained from Chart. Allergies promethazine (Severity:  Mild, Reaction: pt unable to recall) Social History Never smoker, Marital Status - Divorced, Alcohol Use - Never, Drug Use - No History, Caffeine Use - Daily. Medical History Ear/Nose/Mouth/Throat Patient has history of Chronic sinus problems/congestion Hematologic/Lymphatic Patient has history of Anemia - due to CKD Respiratory Patient has history of Chronic Obstructive Pulmonary Disease (COPD) Cardiovascular Patient has history of Congestive Heart Failure, Hypertension, Peripheral Venous Disease Endocrine Patient has history of Type II Diabetes Genitourinary Denies history of End Stage Renal Disease Neurologic Patient has history of Dementia - Alzheimers, Neuropathy Patient is treated with Insulin, Oral Agents. Blood sugar is tested. Medical A Surgical History Notes nd Eyes diabetic retinopathy Cardiovascular atherosclerotic heart disease A fib hyperlipedemia Gastrointestinal GERD Genitourinary CKD stage II Neurologic persistent mood disorder, affective vascular dementia Review of Systems (ROS) Ear/Nose/Mouth/Throat Complains or has symptoms of Chronic sinus problems or rhinitis. Integumentary (Skin) Complains or has symptoms of Wounds. Objective Constitutional Hypertensive. Tachycardic, irregularly irregular. No acute distress. Vitals Time Taken: 7:58 AM, Height: 67 in, Source: Stated, Weight: 183 lbs, Source: Stated, BMI: 28.7, Temperature: 98.3 F, Pulse: 114 bpm, Respiratory Rate: 18 breaths/min, Blood Pressure: 172/100 mmHg. General Notes: pt states did not check sugars this morning, pt states cannot recall range sugar usually runs in Respiratory Normal work of breathing on room air.. Cardiovascular 2+ pitting edema to the bilateral lower extremities. The skin of the left lower extremity is somewhat erythematous.. General Notes: 06/09/2021: Wound exam: The multiple scattered lesions on the lower extremities from the ankle to the knee are superficial with minimal  amount of yellow slough. No surrounding erythema or drainage. The site on her thigh and buttock that had raised concern for possible pressure injury are completely closed and do not even appear consistent with a stage I injury in either site. The most concerning wounds are on her left foot. Heavy eschar and callus covered all 3. After I debrided this off of the third and fourth toes, bone was exposed in both areas. There was exposed fat on the first metatarsal head plantar wound, along with necrotic tissue. All of this was debrided. While we were in clinic, the patient was rubbing her heel on the exam chair and opened up a blister that had been present on her left heel. This appears to be simply denuded skin without any significant depth. Integumentary (Hair, Skin) Wound #1 status is Open. Original cause of wound was Gradually Appeared. The date acquired was: 05/05/2021. The wound is located on the Right,Anterior Lower Leg. The wound measures 1.6cm length x 1cm width x 0.1cm depth; 1.257cm^2 area and 0.126cm^3 volume. There is Fat Layer (Subcutaneous Tissue) exposed. There is no tunneling or undermining noted. There is a medium amount of serosanguineous drainage noted. There is small (1-33%) pale granulation within the wound bed. There is a large (67-100%) amount of necrotic tissue within the wound bed including Adherent Slough. Wound #2 status is Open. Original cause of wound was  Gradually Appeared. The date acquired was: 05/06/2021. The wound is located on the Right,Posterior Lower Leg. The wound measures 0.5cm length x 0.5cm width x 0.2cm depth; 0.196cm^2 area and 0.039cm^3 volume. There is Fat Layer (Subcutaneous Tissue) exposed. There is no tunneling or undermining noted. There is small (1-33%) pink, pale granulation within the wound bed. There is a large (67-100%) amount of necrotic tissue within the wound bed including Adherent Slough. Wound #3 status is Open. Original cause of wound was Gradually  Appeared. The date acquired was: 05/05/2021. The wound is located on the Left,Anterior Lower Leg. The wound measures 0.5cm length x 0.5cm width x 0.1cm depth; 0.196cm^2 area and 0.02cm^3 volume. There is Fat Layer (Subcutaneous Tissue) exposed. There is no tunneling or undermining noted. There is a medium amount of serosanguineous drainage noted. The wound margin is flat and intact. There is small (1-33%) pink, pale granulation within the wound bed. There is a large (67-100%) amount of necrotic tissue within the wound bed including Adherent Slough. Wound #4 status is Open. Original cause of wound was Gradually Appeared. The date acquired was: 05/05/2021. The wound is located on the Left,Medial Lower Leg. The wound measures 0.5cm length x 0.5cm width x 0.1cm depth; 0.196cm^2 area and 0.02cm^3 volume. There is Fat Layer (Subcutaneous Tissue) exposed. There is a medium amount of serosanguineous drainage noted. There is small (1-33%) pink, pale granulation within the wound bed. There is a large (67-100%) amount of necrotic tissue within the wound bed including Adherent Slough. Wound #5 status is Open. Original cause of wound was Gradually Appeared. The date acquired was: 05/05/2021. The wound is located on the Left T Third. The oe wound measures 0.8cm length x 1cm width x 0.1cm depth; 0.628cm^2 area and 0.063cm^3 volume. There is bone and Fat Layer (Subcutaneous Tissue) exposed. There is no tunneling or undermining noted. There is a medium amount of serosanguineous drainage noted. The wound margin is flat and intact. There is small (1-33%) pink granulation within the wound bed. There is a large (67-100%) amount of necrotic tissue within the wound bed including Eschar and Adherent Slough. Wound #6 status is Open. Original cause of wound was Gradually Appeared. The date acquired was: 05/05/2021. The wound is located on the Left Metatarsal head first. The wound measures 1cm length x 1.1cm width x 0.1cm depth;  0.864cm^2 area and 0.086cm^3 volume. There is Fat Layer (Subcutaneous Tissue) exposed. There is no tunneling or undermining noted. There is a medium amount of serosanguineous drainage noted. The wound margin is flat and intact. There is no granulation within the wound bed. There is a large (67-100%) amount of necrotic tissue within the wound bed including Eschar and Adherent Slough. Wound #7 status is Open. Original cause of wound was Gradually Appeared. The date acquired was: 05/05/2021. The wound is located on the Left T Fourth. The oe wound measures 0.5cm length x 0.7cm width x 0.1cm depth; 0.275cm^2 area and 0.027cm^3 volume. There is bone and Fat Layer (Subcutaneous Tissue) exposed. There is no tunneling or undermining noted. There is a medium amount of serosanguineous drainage noted. The wound margin is flat and intact. There is small (1-33%) pink granulation within the wound bed. There is a large (67-100%) amount of necrotic tissue within the wound bed including Eschar and Adherent Slough. Wound #8 status is Open. Original cause of wound was Pressure Injury. The date acquired was: 06/09/2021. The wound is located on the Left Calcaneus. The wound measures 2.5cm length x 3cm width x 0.1cm depth;  5.89cm^2 area and 0.589cm^3 volume. There is Fat Layer (Subcutaneous Tissue) exposed. There is no tunneling or undermining noted. There is a medium amount of serosanguineous drainage noted. The wound margin is flat and intact. There is large (67-100%) red, pink granulation within the wound bed. There is no necrotic tissue within the wound bed. Assessment Active Problems ICD-10 Type 2 diabetes mellitus with foot ulcer Type 2 diabetes mellitus with other skin ulcer Non-pressure chronic ulcer of other part of left foot with bone involvement without evidence of necrosis Peripheral vascular disease, unspecified Chronic venous hypertension (idiopathic) with ulcer of right lower extremity Chronic venous  hypertension (idiopathic) with ulcer of left lower extremity Essential (primary) hypertension Chronic diastolic (congestive) heart failure Chronic atrial fibrillation, unspecified Cerebral infarction, unspecified Procedures Wound #1 Pre-procedure diagnosis of Wound #1 is a Diabetic Wound/Ulcer of the Lower Extremity located on the Right,Anterior Lower Leg .Severity of Tissue Pre Debridement is: Fat layer exposed. There was a Selective/Open Wound Non-Viable Tissue Debridement with a total area of 1.6 sq cm performed by Fredirick Maudlin, MD. With the following instrument(s): Curette to remove Non-Viable tissue/material. Material removed includes [facility]. No specimens were taken. A time out was conducted at 09:55, prior to the start of the procedure. A Minimum amount of bleeding was controlled with Pressure. The procedure was tolerated well with a pain level of 0 throughout and a pain level of 0 following the procedure. Post Debridement Measurements: 1.6cm length x 1cm width x 0.1cm depth; 0.126cm^3 volume. Character of Wound/Ulcer Post Debridement requires further debridement. Severity of Tissue Post Debridement is: Fat layer exposed. Post procedure Diagnosis Wound #1: Same as Pre-Procedure Wound #3 Pre-procedure diagnosis of Wound #3 is a Diabetic Wound/Ulcer of the Lower Extremity located on the Left,Anterior Lower Leg .Severity of Tissue Pre Debridement is: Fat layer exposed. There was a Selective/Open Wound Non-Viable Tissue Debridement with a total area of 0.25 sq cm performed by Fredirick Maudlin, MD. With the following instrument(s): Curette to remove Non-Viable tissue/material. Material removed includes [facility]. No specimens were taken. A time out was conducted at 09:55, prior to the start of the procedure. A Minimum amount of bleeding was controlled with Pressure. The procedure was tolerated well with a pain level of 0 throughout and a pain level of 0 following the procedure. Post Debridement  Measurements: 0.5cm length x 0.5cm width x 0.1cm depth; 0.02cm^3 volume. Character of Wound/Ulcer Post Debridement requires further debridement. Severity of Tissue Post Debridement is: Fat layer exposed. Post procedure Diagnosis Wound #3: Same as Pre-Procedure Wound #4 Pre-procedure diagnosis of Wound #4 is a Diabetic Wound/Ulcer of the Lower Extremity located on the Left,Medial Lower Leg .Severity of Tissue Pre Debridement is: Fat layer exposed. There was a Selective/Open Wound Non-Viable Tissue Debridement with a total area of 0.25 sq cm performed by Fredirick Maudlin, MD. With the following instrument(s): Curette to remove Non-Viable tissue/material. Material removed includes [facility]. No specimens were taken. A time out was conducted at 09:55, prior to the start of the procedure. A Minimum amount of bleeding was controlled with Pressure. The procedure was tolerated well with a pain level of 0 throughout and a pain level of 0 following the procedure. Post Debridement Measurements: 0.5cm length x 0.5cm width x 0.1cm depth; 0.02cm^3 volume. Character of Wound/Ulcer Post Debridement requires further debridement. Severity of Tissue Post Debridement is: Fat layer exposed. Post procedure Diagnosis Wound #4: Same as Pre-Procedure Wound #5 Pre-procedure diagnosis of Wound #5 is a Diabetic Wound/Ulcer of the Lower Extremity located on the  Left T Third .Severity of Tissue Pre Debridement is: oe Fat layer exposed. There was a Selective/Open Wound Non-Viable Tissue Debridement with a total area of 0.8 sq cm performed by Fredirick Maudlin, MD. With the following instrument(s): Curette to remove Non-Viable tissue/material. Material removed includes Callus and Slough and. No specimens were taken. A time out was conducted at 09:55, prior to the start of the procedure. A Minimum amount of bleeding was controlled with Pressure. The procedure was tolerated well with a pain level of 0 throughout and a pain level of 0  following the procedure. Post Debridement Measurements: 0.8cm length x 1cm width x 0.1cm depth; 0.063cm^3 volume. Character of Wound/Ulcer Post Debridement requires further debridement. Severity of Tissue Post Debridement is: Fat layer exposed. Post procedure Diagnosis Wound #5: Same as Pre-Procedure Wound #6 Pre-procedure diagnosis of Wound #6 is a Diabetic Wound/Ulcer of the Lower Extremity located on the Left Metatarsal head first .Severity of Tissue Pre Debridement is: Fat layer exposed. There was a Selective/Open Wound Non-Viable Tissue Debridement with a total area of 1.1 sq cm performed by Fredirick Maudlin, MD. With the following instrument(s): Curette to remove Non-Viable tissue/material. Material removed includes [facility]. No specimens were taken. A time out was conducted at 09:55, prior to the start of the procedure. A Minimum amount of bleeding was controlled with Pressure. The procedure was tolerated well with a pain level of 0 throughout and a pain level of 0 following the procedure. Post Debridement Measurements: 1cm length x 1.1cm width x 0.1cm depth; 0.086cm^3 volume. Character of Wound/Ulcer Post Debridement requires further debridement. Severity of Tissue Post Debridement is: Fat layer exposed. Post procedure Diagnosis Wound #6: Same as Pre-Procedure Wound #7 Pre-procedure diagnosis of Wound #7 is a Diabetic Wound/Ulcer of the Lower Extremity located on the Left T Fourth .Severity of Tissue Pre Debridement is: oe Fat layer exposed. There was a Selective/Open Wound Non-Viable Tissue Debridement with a total area of 0.35 sq cm performed by Fredirick Maudlin, MD. With the following instrument(s): Curette to remove Non-Viable tissue/material. Material removed includes [facility]. No specimens were taken. A time out was conducted at 09:55, prior to the start of the procedure. A Minimum amount of bleeding was controlled with Pressure. The procedure was tolerated well with a pain level of 0  throughout and a pain level of 0 following the procedure. Post Debridement Measurements: 0.5cm length x 0.7cm width x 0.1cm depth; 0.027cm^3 volume. Character of Wound/Ulcer Post Debridement requires further debridement. Severity of Tissue Post Debridement is: Fat layer exposed. Post procedure Diagnosis Wound #7: Same as Pre-Procedure Plan Follow-up Appointments: Return Appointment in 1 week. - Dr. Celine Ahr Bathing/ Shower/ Hygiene: May shower with protection but do not get wound dressing(s) wet. Edema Control - Lymphedema / SCD / Other: Elevate legs to the level of the heart or above for 30 minutes daily and/or when sitting, a frequency of: - throughout the day Moisturize legs daily. Off-Loading: Open toe surgical shoe to: - left foot Services and Therapies ordered were: Arterial Studies- Bilateral w/TBIs - Non healing wounds on bilateral lower extremities, ABIs non compressible in clinic. Wound clinic will refer to Vascular and Vein Specialist. Radiology ordered were: X-ray, left foot, complete view - Non healing wounds on left plantar foot, 3rd and 4th toe, with exposed bone. ICD-10: E11.621 WOUND #1: - Lower Leg Wound Laterality: Right, Anterior Cleanser: Soap and Water Every Other Day/7 Days Discharge Instructions: May shower and wash wound with dial antibacterial soap and water prior to dressing change. Cleanser:  Wound Cleanser Every Other Day/7 Days Discharge Instructions: Cleanse the wound with wound cleanser prior to applying a clean dressing using gauze sponges, not tissue or cotton balls. Peri-Wound Care: Sween Lotion (Moisturizing lotion) Every Other Day/7 Days Discharge Instructions: Apply moisturizing lotion as directed Prim Dressing: KerraCel Ag Gelling Fiber Dressing, 2x2 in (silver alginate) Every Other Day/7 Days ary Discharge Instructions: Apply silver alginate to wound bed as instructed Secondary Dressing: Woven Gauze Sponge, Non-Sterile 4x4 in Every Other Day/7  Days Discharge Instructions: Apply over primary dressing as directed. Com pression Wrap: Kerlix Roll 4.5x3.1 (in/yd) Every Other Day/7 Days Discharge Instructions: Apply Kerlix and Coban compression as directed. Com pression Wrap: Coban Self-Adherent Wrap 4x5 (in/yd) Every Other Day/7 Days Discharge Instructions: Apply over Kerlix as directed. WOUND #2: - Lower Leg Wound Laterality: Right, Posterior Cleanser: Soap and Water Every Other Day/7 Days Discharge Instructions: May shower and wash wound with dial antibacterial soap and water prior to dressing change. Cleanser: Wound Cleanser Every Other Day/7 Days Discharge Instructions: Cleanse the wound with wound cleanser prior to applying a clean dressing using gauze sponges, not tissue or cotton balls. Peri-Wound Care: Sween Lotion (Moisturizing lotion) Every Other Day/7 Days Discharge Instructions: Apply moisturizing lotion as directed Prim Dressing: KerraCel Ag Gelling Fiber Dressing, 2x2 in (silver alginate) Every Other Day/7 Days ary Discharge Instructions: Apply silver alginate to wound bed as instructed Secondary Dressing: Woven Gauze Sponge, Non-Sterile 4x4 in Every Other Day/7 Days Discharge Instructions: Apply over primary dressing as directed. Com pression Wrap: Kerlix Roll 4.5x3.1 (in/yd) Every Other Day/7 Days Discharge Instructions: Apply Kerlix and Coban compression as directed. Com pression Wrap: Coban Self-Adherent Wrap 4x5 (in/yd) Every Other Day/7 Days Discharge Instructions: Apply over Kerlix as directed. WOUND #3: - Lower Leg Wound Laterality: Left, Anterior Cleanser: Soap and Water Every Other Day/7 Days Discharge Instructions: May shower and wash wound with dial antibacterial soap and water prior to dressing change. Cleanser: Wound Cleanser Every Other Day/7 Days Discharge Instructions: Cleanse the wound with wound cleanser prior to applying a clean dressing using gauze sponges, not tissue or cotton balls. Peri-Wound  Care: Sween Lotion (Moisturizing lotion) Every Other Day/7 Days Discharge Instructions: Apply moisturizing lotion as directed Prim Dressing: KerraCel Ag Gelling Fiber Dressing, 2x2 in (silver alginate) Every Other Day/7 Days ary Discharge Instructions: Apply silver alginate to wound bed as instructed Secondary Dressing: Woven Gauze Sponge, Non-Sterile 4x4 in Every Other Day/7 Days Discharge Instructions: Apply over primary dressing as directed. Com pression Wrap: Kerlix Roll 4.5x3.1 (in/yd) Every Other Day/7 Days Discharge Instructions: Apply Kerlix and Coban compression as directed. Com pression Wrap: Coban Self-Adherent Wrap 4x5 (in/yd) Every Other Day/7 Days Discharge Instructions: Apply over Kerlix as directed. WOUND #4: - Lower Leg Wound Laterality: Left, Medial Cleanser: Soap and Water Every Other Day/7 Days Discharge Instructions: May shower and wash wound with dial antibacterial soap and water prior to dressing change. Cleanser: Wound Cleanser Every Other Day/7 Days Discharge Instructions: Cleanse the wound with wound cleanser prior to applying a clean dressing using gauze sponges, not tissue or cotton balls. Peri-Wound Care: Sween Lotion (Moisturizing lotion) Every Other Day/7 Days Discharge Instructions: Apply moisturizing lotion as directed Prim Dressing: KerraCel Ag Gelling Fiber Dressing, 2x2 in (silver alginate) Every Other Day/7 Days ary Discharge Instructions: Apply silver alginate to wound bed as instructed Secondary Dressing: Woven Gauze Sponge, Non-Sterile 4x4 in Every Other Day/7 Days Discharge Instructions: Apply over primary dressing as directed. Com pression Wrap: Kerlix Roll 4.5x3.1 (in/yd) Every Other Day/7 Days Discharge  Instructions: Apply Kerlix and Coban compression as directed. Com pression Wrap: Coban Self-Adherent Wrap 4x5 (in/yd) Every Other Day/7 Days Discharge Instructions: Apply over Kerlix as directed. WOUND #5: - T Third Wound Laterality:  Left oe Cleanser: Wound Cleanser Every Other Day/7 Days Discharge Instructions: Cleanse the wound with wound cleanser prior to applying a clean dressing using gauze sponges, not tissue or cotton balls. Prim Dressing: Promogran Prisma Matrix, 4.34 (sq in) (silver collagen) Every Other Day/7 Days ary Discharge Instructions: Moisten collagen with saline or hydrogel Secondary Dressing: Woven Gauze Sponges 2x2 in Every Other Day/7 Days Discharge Instructions: Apply over primary dressing as directed. Secured With: Child psychotherapist, Sterile 2x75 (in/in) Every Other Day/7 Days Discharge Instructions: Secure with stretch gauze as directed. Secured With: Paper T ape, 1x10 (in/yd) Every Other Day/7 Days Discharge Instructions: Secure dressing with tape as directed. WOUND #6: - Metatarsal head first Wound Laterality: Left Cleanser: Soap and Water Every Other Day/7 Days Discharge Instructions: May shower and wash wound with dial antibacterial soap and water prior to dressing change. Cleanser: Wound Cleanser Every Other Day/7 Days Discharge Instructions: Cleanse the wound with wound cleanser prior to applying a clean dressing using gauze sponges, not tissue or cotton balls. Peri-Wound Care: Sween Lotion (Moisturizing lotion) Every Other Day/7 Days Discharge Instructions: Apply moisturizing lotion as directed Prim Dressing: Promogran Prisma Matrix, 4.34 (sq in) (silver collagen) Every Other Day/7 Days ary Discharge Instructions: Moisten collagen with saline or hydrogel Secondary Dressing: Woven Gauze Sponge, Non-Sterile 4x4 in Every Other Day/7 Days Discharge Instructions: Apply over primary dressing as directed. Com pression Wrap: Kerlix Roll 4.5x3.1 (in/yd) Every Other Day/7 Days Discharge Instructions: Apply Kerlix and Coban compression as directed. Com pression Wrap: Coban Self-Adherent Wrap 4x5 (in/yd) Every Other Day/7 Days Discharge Instructions: Apply over Kerlix as  directed. WOUND #7: - T Fourth Wound Laterality: Left oe Cleanser: Wound Cleanser Every Other Day/7 Days Discharge Instructions: Cleanse the wound with wound cleanser prior to applying a clean dressing using gauze sponges, not tissue or cotton balls. Prim Dressing: Promogran Prisma Matrix, 4.34 (sq in) (silver collagen) Every Other Day/7 Days ary Discharge Instructions: Moisten collagen with saline or hydrogel Secondary Dressing: Woven Gauze Sponges 2x2 in Every Other Day/7 Days Discharge Instructions: Apply over primary dressing as directed. Secured With: Child psychotherapist, Sterile 2x75 (in/in) Every Other Day/7 Days Discharge Instructions: Secure with stretch gauze as directed. Secured With: Paper T ape, 1x10 (in/yd) Every Other Day/7 Days Discharge Instructions: Secure dressing with tape as directed. WOUND #8: - Calcaneus Wound Laterality: Left Cleanser: Soap and Water Every Other Day/7 Days Discharge Instructions: May shower and wash wound with dial antibacterial soap and water prior to dressing change. Cleanser: Wound Cleanser Every Other Day/7 Days Discharge Instructions: Cleanse the wound with wound cleanser prior to applying a clean dressing using gauze sponges, not tissue or cotton balls. Peri-Wound Care: Sween Lotion (Moisturizing lotion) Every Other Day/7 Days Discharge Instructions: Apply moisturizing lotion as directed Prim Dressing: KerraCel Ag Gelling Fiber Dressing, 2x2 in (silver alginate) Every Other Day/7 Days ary Discharge Instructions: Apply silver alginate to wound bed as instructed Secondary Dressing: Woven Gauze Sponge, Non-Sterile 4x4 in Every Other Day/7 Days Discharge Instructions: Apply over primary dressing as directed. Secondary Dressing: ALLEVYN Heel 4 1/2in x 5 1/2in / 10.5cm x 13.5cm Every Other Day/7 Days Discharge Instructions: Apply over primary dressing as directed. Com pression Wrap: Kerlix Roll 4.5x3.1 (in/yd) Every Other Day/7  Days Discharge Instructions: Apply Kerlix and Coban compression as  directed. Com pression Wrap: Coban Self-Adherent Wrap 4x5 (in/yd) Every Other Day/7 Days Discharge Instructions: Apply over Kerlix as directed. 06/09/2021: The multiple scattered lesions on the lower extremities from the ankle to the knee are superficial with minimal amount of yellow slough. No surrounding erythema or drainage. The site on her thigh and buttock that had raised concern for possible pressure injury are completely closed and do not even appear consistent with a stage I injury in either site. The most concerning wounds are on her left foot. Heavy eschar and callus covered all 3. After I debrided this off of the third and fourth toes, bone was exposed in both areas. There was exposed fat on the first metatarsal head plantar wound, along with necrotic tissue. All of this was debrided. This is a complex patient with multiple medical problems, as well as multiple wounds. The etiology of the smaller lesions scattered on her lower extremities from the ankle to knee appears to potentially be venous stasis. She does have changes suggestive of venous stasis dermatitis on the left leg. The area on her buttock and left thigh are not open and do not even appear to be consistent with a stage I pressure injury. The most concerning findings are on her left foot with diabetic foot ulcers on the third and fourth toes with exposed bone, as well as a plantar first metatarsal head wound. The heel wound on this side looks like she rubbed a blister off while in clinic, but it does not have any significant depth. #1: For the small lesions scattered on her lower extremities, slough was debrided today and we will apply silver alginate to these areas, along with to the heel on the left. #2: Silver collagen to the third and fourth toes and plantar first metatarsal head wounds on the left. #3: I do not think she will be able to hold still long enough  to obtain an MRI, therefore we will get x-rays of the left foot to evaluate for osteomyelitis. #4: ABIs were noncompressible in clinic today. We will send her for formal evaluation in the vein and vascular clinic. #5: While we are waiting ABIs, I cannot put her in high degree compression, therefore we will just use Kerlix and Coban until I have the necessary data. #6: There is no evidence of gross infection or current systemic illness and I have not initiated oral antibiotics. She may ultimately require long-term antibiotic therapy, should osteomyelitis be identified. We would need to obtain a good bone culture to guide therapy, should this be the case. #7: We may want to pursue venous reflux studies, in addition to the formal ABI/TBI studies, as she does have a history of DVT and evidence of lower extremity edema bilaterally, with changes of stasis dermatitis in the left limb. She should elevate her legs as much as possible. #8: No evidence of pressure injury on the left posterior thigh or buttock, but we have recommended that she offload frequently and may apply a foam dressing to the sites. Follow-up in 1 week. Electronic Signature(s) Signed: 06/09/2021 12:44:00 PM By: Fredirick Maudlin MD FACS Previous Signature: 06/09/2021 12:41:56 PM Version By: Fredirick Maudlin MD FACS Entered By: Fredirick Maudlin on 06/09/2021 12:44:00 -------------------------------------------------------------------------------- HxROS Details Patient Name: Date of Service: Gerlean Ren, [place]. 06/09/2021 9:00 A M Medical Record Number: [id number] Patient Account Number: [id number] Date of Birth/Sex: Treating RN: Dec 21, 1945 (76 y.o. F) Primary Care Provider: Bing Matter Other Clinician: Referring Provider: Treating Provider/Extender: Donia Ast in  Treatment: 0 Unable to Obtain Patient History due to Dementia Information Obtained From Chart Ear/Nose/Mouth/Throat Complaints and  Symptoms: Positive for: Chronic sinus problems or rhinitis Medical History: Positive for: Chronic sinus problems/congestion Integumentary (Skin) Complaints and Symptoms: Positive for: Wounds Eyes Medical History: Past Medical History Notes: diabetic retinopathy Hematologic/Lymphatic Medical History: Positive for: Anemia - due to CKD Respiratory Medical History: Positive for: Chronic Obstructive Pulmonary Disease (COPD) Cardiovascular Medical History: Positive for: Congestive Heart Failure; Hypertension; Peripheral Venous Disease Past Medical History Notes: atherosclerotic heart disease A fib hyperlipedemia Gastrointestinal Medical History: Past Medical History Notes: GERD Endocrine Medical History: Positive for: Type II Diabetes Treated with: Insulin, Oral agents Blood sugar tested every day: Yes Tested : Genitourinary Medical History: Negative for: End Stage Renal Disease Past Medical History Notes: CKD stage II Neurologic Medical History: Positive for: Dementia - Alzheimers; Neuropathy Past Medical History Notes: persistent mood disorder, affective vascular dementia HBO Extended History Items Ear/Nose/Mouth/Throat: Chronic sinus problems/congestion Immunizations Pneumococcal Vaccine: Received Pneumococcal Vaccination: Yes Received Pneumococcal Vaccination On or After 60th Birthday: Yes Implantable Devices None Family and Social History Never smoker; Marital Status - Divorced; Alcohol Use: Never; Drug Use: No History; Caffeine Use: Daily; Financial Concerns: No; Food, Clothing or Shelter Needs: No; Support System Lacking: No; Transportation Concerns: No Engineer, maintenance) Signed: 06/09/2021 12:45:07 PM By: Fredirick Maudlin MD FACS Signed: 06/09/2021 5:20:27 PM By: Sharyn Creamer RN, BSN Entered By: Sharyn Creamer on 06/09/2021 09:02:02 -------------------------------------------------------------------------------- SuperBill Details Patient Name: Date  of Service: [name] 06/09/2021 Medical Record Number: [id number] Patient Account Number: [id number] Date of Birth/Sex: Treating RN: Dec 12, 1945 (76 y.o. 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 of necrosis I73.9 Peripheral vascular disease, unspecified I87.311 Chronic venous hypertension (idiopathic) with ulcer of right lower extremity I87.312 Chronic venous hypertension (idiopathic) with ulcer of left lower extremity I10 Essential (primary) hypertension Y63.78 Chronic diastolic (congestive) heart failure I48.20 Chronic atrial fibrillation, unspecified I63.9 Cerebral infarction, unspecified Facility Procedures CPT4 Code: [id number] Description: 99213 - WOUND CARE VISIT-LEV 3 EST PT Modifier: 25 Quantity: 1 CPT4 Code: [id number] Description: 67209 - DEBRIDE WOUND 1ST 20 SQ CM OR < ICD-10 Diagnosis Description E11.621 Type 2 diabetes mellitus with foot ulcer E11.622 Type 2 diabetes mellitus with other skin ulcer L97.526 Non-pressure chronic ulcer of other part of left foot with  bone involvement witho Modifier: ut evidence of necr Quantity: 1 osis Physician Procedures : CPT4 Code Description Modifier [id number] - WC PHYS LEVEL 5 - NEW PT 25 ICD-10 Diagnosis Description E11.621 Type 2 diabetes mellitus with foot ulcer E11.622 Type 2 diabetes mellitus with other skin ulcer L97.526 Non-pressure chronic ulcer of other  part of left foot with bone involvement without evidence of necro Quantity: 1 sis : [id number] - WC PHYS DEBR WO ANESTH 20 SQ CM ICD-10 Diagnosis Description E11.621 Type 2 diabetes mellitus with  foot ulcer E11.622 Type 2 diabetes mellitus with other skin ulcer L97.526 Non-pressure chronic ulcer of other part of left foot with bone  involvement without evidence of necro Quantity: 1 sis Electronic Signature(s) Signed: 06/09/2021 5:13:33 PM By: Fredirick Maudlin MD FACS Signed: 06/09/2021 6:27:47 PM By: Levan Hurst RN, BSN Previous Signature: 06/09/2021 12:42:53 PM Version By: Fredirick Maudlin MD  FACS Entered By: Levan Hurst on 06/09/2021 17:01:04

## 2021-06-09 NOTE — Progress Notes (Signed)
VASILIKI, SMALDONE ([id number]) Visit Report for 06/09/2021 Allergy List Details Patient Name: Date of Service: [place], [name] 06/09/2021 9:00 A M Medical Record Number: [id number] Patient Account Number: [id number] Date of Birth/Sex: Treating RN: [birth year] (76 y.o. F) Primary Care Lyriq Finerty: Bing Matter Other Clinician: Referring Jatavis Malek: Treating Kemonie Cutillo/Extender: Willodean Rosenthal Weeks in Treatment: 0 Allergies Active Allergies promethazine Reaction: pt unable to recall Severity: Mild Allergy Notes Electronic Signature(s) Signed: 06/09/2021 9:49:12 AM By: Sandre Kitty Entered By: Sandre Kitty on 06/09/2021 08:23:15 -------------------------------------------------------------------------------- Arrival Information Details Patient Name: Date of Service: [name]. 06/09/2021 9:00 A M Medical Record Number: [id number] Patient Account Number: [id number] Date of Birth/Sex: Treating RN: 1946/02/26 (76 y.o. F) Primary Care Trey Bebee: Bing Matter Other Clinician: Referring Rembert Browe: Treating Omarii Scalzo/Extender: Donia Ast in Treatment: 0 Visit Information Patient Arrived: Wheel Chair Arrival Time: 07:58 Accompanied By: caregiver Transfer Assistance: Manual Patient Identification Verified: Yes Secondary Verification Process Completed: Yes Patient Has Alerts: Yes Patient Alerts: Patient on Blood Thinner R ABI non compressible L ABI non compressible Electronic Signature(s) Signed: 06/09/2021 6:27:47 PM By: Levan Hurst RN, BSN Previous Signature: 06/09/2021 9:49:12 AM Version By: Sandre Kitty Entered By: Levan Hurst on 06/09/2021 09:50:40 -------------------------------------------------------------------------------- Clinic Level of Care Assessment Details Patient Name: Date of Service: [name]. 06/09/2021 9:00 A M Medical Record Number: [id number] Patient Account Number: [id number] Date of Birth/Sex:  Treating RN: [birth year] (76 y.o. [name] Primary Care Juana Haralson: Bing Matter Other Clinician: Referring Lenward Able: Treating Christo Hain/Extender: Donia Ast in Treatment: 0 Clinic Level of Care Assessment Items TOOL 1 Quantity Score X- 1 0 Use when EandM and Procedure is performed on INITIAL visit ASSESSMENTS - Nursing Assessment / Reassessment X- 1 20 General Physical Exam (combine w/ comprehensive assessment (listed just below) when performed on new pt. evals) X- 1 25 Comprehensive Assessment (HX, ROS, Risk Assessments, Wounds Hx, etc.) ASSESSMENTS - Wound and Skin Assessment / Reassessment '[]'$  - 0 Dermatologic / Skin Assessment (not related to wound area) ASSESSMENTS - Ostomy and/or Continence Assessment and Care '[]'$  - 0 Incontinence Assessment and Management '[]'$  - 0 Ostomy Care Assessment and Management (repouching, etc.) PROCESS - Coordination of Care X - Simple Patient / Family Education for ongoing care 1 15 '[]'$  - 0 Complex (extensive) Patient / Family Education for ongoing care X- 1 10 Staff obtains Programmer, systems, Records, T Results / Process Orders est '[]'$  - 0 Staff telephones HHA, Nursing Homes / Clarify orders / etc '[]'$  - 0 Routine Transfer to another Facility (non-emergent condition) '[]'$  - 0 Routine Hospital Admission (non-emergent condition) X- 1 15 New Admissions / Biomedical engineer / Ordering NPWT Apligraf, etc. , '[]'$  - 0 Emergency Hospital Admission (emergent condition) PROCESS - Special Needs '[]'$  - 0 Pediatric / Minor Patient Management '[]'$  - 0 Isolation Patient Management '[]'$  - 0 Hearing / Language / Visual special needs '[]'$  - 0 Assessment of Community assistance (transportation, D/C planning, etc.) '[]'$  - 0 Additional assistance / Altered mentation '[]'$  - 0 Support Surface(s) Assessment (bed, cushion, seat, etc.) INTERVENTIONS - Miscellaneous '[]'$  - 0 External ear exam '[]'$  - 0 Patient Transfer (multiple staff / Librarian, academic / Similar devices) '[]'$  - 0 Simple Staple / Suture removal (25 or less) '[]'$  - 0 Complex Staple / Suture removal (26 or more) '[]'$  - 0 Hypo/Hyperglycemic Management (do not check if billed separately) X- 1 15 Ankle / Brachial Index (ABI) - do not check if billed separately Has the  patient been seen at the hospital within the last three years: Yes Total Score: 100 Level Of Care: New/Established - Level 3 Electronic Signature(s) Signed: 06/09/2021 6:27:47 PM By: Levan Hurst RN, BSN Signed: 06/09/2021 6:27:47 PM By: Levan Hurst RN, BSN Entered By: Levan Hurst on 06/09/2021 17:00:51 -------------------------------------------------------------------------------- Encounter Discharge Information Details Patient Name: Date of Service: Quentin Angst L. 06/09/2021 9:00 A M Medical Record Number: [id number] Patient Account Number: [id number] Date of Birth/Sex: Treating RN: 11/25/1945 (76 y.o. Donalda Ewings Primary Care Tifanie Gardiner: Bing Matter Other Clinician: Referring Enyla Lisbon: Treating Joshalyn Ancheta/Extender: Donia Ast in Treatment: 0 Encounter Discharge Information Items Post Procedure Vitals Discharge Condition: Stable Temperature (F): 98.3 Ambulatory Status: Wheelchair Pulse (bpm): 114 Discharge Destination: [place] Respiratory Rate (breaths/min): 18 Telephoned: No Blood Pressure (mmHg): 172/100 Orders Sent: Yes Transportation: Private Auto Accompanied By: caregiver Schedule Follow-up Appointment: Yes Clinical Summary of Care: Patient Declined Electronic Signature(s) Signed: 06/09/2021 5:20:27 PM By: Sharyn Creamer RN, BSN Entered By: Sharyn Creamer on 06/09/2021 16:40:43 -------------------------------------------------------------------------------- Lower Extremity Assessment Details Patient Name: Date of Service: [name]. 06/09/2021 9:00 A M Medical Record Number: [id number] Patient Account Number: [id number] Date  of Birth/Sex: Treating RN: February 15, 1946 (76 y.o. F) Primary Care Winton Offord: Bing Matter Other Clinician: Referring Sima Lindenberger: Treating Darren Nodal/Extender: Donia Ast in Treatment: 0 Edema Assessment Assessed: [Left: No] [Right: No] E[Left: dema] [Right: :] Calf Left: Right: Point of Measurement: 28 cm From Medial Instep 30.1 cm 34 cm Ankle Left: Right: Point of Measurement: 11 cm From Medial Instep 20.7 cm 20.3 cm Knee To Floor Left: Right: From Medial Instep 42 cm Vascular Assessment Pulses: Dorsalis Pedis Palpable: [Left:No] [Right:Yes] Electronic Signature(s) Signed: 06/09/2021 9:49:12 AM By: Sandre Kitty Entered By: Sandre Kitty on 06/09/2021 08:44:15 -------------------------------------------------------------------------------- Multi Wound Chart Details Patient Name: Date of Service: [name]. 06/09/2021 9:00 A M Medical Record Number: [id number] Patient Account Number: [id number] Date of Birth/Sex: Treating RN: January 19, 1946 (76 y.o. F) Primary Care Orlo Brickle: Bing Matter Other Clinician: Referring Diogo Anne: Treating Estill Llerena/Extender: Donia Ast in Treatment: 0 Vital Signs Height(in): 66 Pulse(bpm): 114 Weight(lbs): 183 Blood Pressure(mmHg): 172/100 Body Mass Index(BMI): 28.7 Temperature(F): 98.3 Respiratory Rate(breaths/min): 18 Photos: [1:No Photos Right, Anterior Lower Leg] [2:No Photos Right, Posterior Lower Leg] [3:No Photos Left, Anterior Lower Leg] Wound Location: [1:Gradually Appeared] [2:Gradually Appeared] [3:Gradually Appeared] Wounding Event: [1:T be determined o] [2:T be determined o] [3:Diabetic Wound/Ulcer of the Lower] Primary Etiology: [1:Chronic sinus problems/congestion, Chronic sinus problems/congestion, Chronic sinus problems/congestion,] [3:Extremity] Comorbid History: [1:Anemia, Chronic Obstructive Pulmonary Disease (COPD), Congestive Heart Failure, Hypertension,  Peripheral Venous Disease, Type II Diabetes, Dementia, Disease, Type II Diabetes, Dementia, Disease, Type II Diabetes, Dementia, Neuropathy  05/05/2021] [2:Anemia, Chronic Obstructive Pulmonary Disease (COPD), Congestive Heart Failure, Hypertension, Peripheral Venous Neuropathy 05/06/2021] [3:Anemia, Chronic Obstructive Pulmonary Disease (COPD), Congestive Heart Failure, Hypertension, Peripheral  Venous Neuropathy 05/05/2021] Date Acquired: [1:0] [2:0] [3:0] Weeks of Treatment: [1:Open] [2:Open] [3:Open] Wound Status: [1:No] [2:No] [3:No] Wound Recurrence: [1:Yes] [2:Yes] [3:Yes] Clustered Wound: [1:1.6x1x0.1] [2:0.5x0.5x0.2] [3:0.5x0.5x0.1] Measurements L x W x D (cm) [1:1.257] [2:0.196] [3:0.196] A (cm) : rea [1:0.126] [2:0.039] [3:0.02] Volume (cm) : [1:0.00%] [2:0.00%] [3:N/A] % Reduction in A [1:rea: 0.00%] [2:0.00%] [3:N/A] % Reduction in Volume: [1:Full Thickness Without Exposed] [2:Full Thickness Without Exposed] [3:Grade 2] Classification: [1:Support Structures Medium] [2:Support Structures N/A] [3:Medium] Exudate A mount: [1:Serosanguineous] [2:N/A] [3:Serosanguineous] Exudate Type: [1:red, brown] [2:N/A] [3:red, brown] Exudate Color: [1:N/A] [2:N/A] [3:Flat and Intact] Wound Margin: [1:Small (1-33%)] [2:Small (  1-33%)] [3:Small (1-33%)] Granulation A mount: [1:Pale] [2:Pink, Pale] [3:Pink, Pale] Granulation Quality: [1:Large (67-100%)] [2:Large (67-100%)] [3:Large (67-100%)] Necrotic A mount: [1:Adherent Slough] [2:Adherent Slough] [3:Adherent Slough] Necrotic Tissue: [1:Fat Layer (Subcutaneous Tissue): Yes Fat Layer (Subcutaneous Tissue): Yes Fat Layer (Subcutaneous Tissue): Yes] Exposed Structures: [1:Fascia: No Tendon: No Muscle: No Joint: No Bone: No None] [2:Fascia: No Tendon: No Muscle: No Joint: No Bone: No None] [3:Fascia: No Tendon: No Muscle: No Joint: No Bone: No None] Epithelialization: [1:Debridement - Selective/Open Wound N/A] [3:Debridement - Selective/Open  Wound] Debridement: Pre-procedure Verification/Time Out 09:55 [2:N/A] [3:09:55] Taken: [1:Slough] [2:N/A] [3:Slough] Tissue Debrided: [1:Non-Viable Tissue] [2:N/A] [3:Non-Viable Tissue] Level: [1:1.6] [2:N/A] [3:0.25] Debridement A (sq cm): [1:rea Curette] [2:N/A] [3:Curette] Instrument: [1:Minimum] [2:N/A] [3:Minimum] Bleeding: [1:Pressure] [2:N/A] [3:Pressure] Hemostasis A chieved: [1:0] [2:N/A] [3:0] Procedural Pain: [1:0] [2:N/A] [3:0] Post Procedural Pain: [1:Procedure was tolerated well] [2:N/A] [3:Procedure was tolerated well] Debridement Treatment Response: [1:1.6x1x0.1] [2:N/A] [3:0.5x0.5x0.1] Post Debridement Measurements L x W x D (cm) [1:0.126] [2:N/A] [3:0.02] Post Debridement Volume: (cm) [1:Debridement] [2:N/A] [3:Debridement] Wound Number: '4 5 6 '$ Photos: No Photos No Photos No Photos Left, Medial Lower Leg Left T Third oe Left Metatarsal head first Wound Location: Gradually Appeared Gradually Appeared Gradually Appeared Wounding Event: Diabetic Wound/Ulcer of the Lower Diabetic Wound/Ulcer of the Lower Diabetic Wound/Ulcer of the Lower Primary Etiology: Extremity Extremity Extremity Chronic sinus problems/congestion, Chronic sinus problems/congestion, Chronic sinus problems/congestion, Comorbid History: Anemia, Chronic Obstructive Anemia, Chronic Obstructive Anemia, Chronic Obstructive Pulmonary Disease (COPD), Pulmonary Disease (COPD), Pulmonary Disease (COPD), Congestive Heart Failure, Congestive Heart Failure, Congestive Heart Failure, Hypertension, Peripheral Venous Hypertension, Peripheral Venous Hypertension, Peripheral Venous Disease, Type II Diabetes, Dementia, Disease, Type II Diabetes, Dementia, Disease, Type II Diabetes, Dementia, Neuropathy Neuropathy Neuropathy 05/05/2021 05/05/2021 05/05/2021 Date Acquired: 0 0 0 Weeks of Treatment: Open Open Open Wound Status: No No No Wound Recurrence: Yes No No Clustered Wound: 0.5x0.5x0.1 0.8x1x0.1  1x1.1x0.1 Measurements L x W x D (cm) 0.196 0.628 0.864 A (cm) : rea 0.02 0.063 0.086 Volume (cm) : N/A 0.00% 0.00% % Reduction in A rea: N/A 0.00% 0.00% % Reduction in Volume: Grade 2 Grade 2 Grade 2 Classification: Medium Medium Medium Exudate A mount: Serosanguineous Serosanguineous Serosanguineous Exudate Type: red, brown red, brown red, brown Exudate Color: N/A Flat and Intact Flat and Intact Wound Margin: Small (1-33%) Small (1-33%) None Present (0%) Granulation A mount: Pink, Pale Pink N/A Granulation Quality: Large (67-100%) Large (67-100%) Large (67-100%) Necrotic A mount: Adherent Slough Eschar, Adherent Slough Eschar, Adherent Slough Necrotic Tissue: Fat Layer (Subcutaneous Tissue): Yes Fat Layer (Subcutaneous Tissue): Yes Fat Layer (Subcutaneous Tissue): Yes Exposed Structures: Fascia: No Bone: Yes Fascia: No Tendon: No Fascia: No Tendon: No Muscle: No Tendon: No Muscle: No Joint: No Muscle: No Joint: No Bone: No Joint: No Bone: No N/A None None Epithelialization: Debridement - Selective/Open Wound Debridement - Selective/Open Wound Debridement - Selective/Open Wound Debridement: Pre-procedure Verification/Time Out 09:55 09:55 09:55 Taken: [employer], [employer] Tissue Debrided: Non-Viable Tissue Non-Viable Tissue Non-Viable Tissue Level: 0.25 0.8 1.1 Debridement A (sq cm): rea Curette Curette Curette Instrument: Minimum Minimum Minimum Bleeding: Pressure Pressure Pressure Hemostasis A chieved: 0 0 0 Procedural Pain: 0 0 0 Post Procedural Pain: Procedure was tolerated well Procedure was tolerated well Procedure was tolerated well Debridement Treatment Response: 0.5x0.5x0.1 0.8x1x0.1 1x1.1x0.1 Post Debridement Measurements L x W x D (cm) 0.02 0.063 0.086 Post Debridement Volume: (cm) Debridement Debridement Debridement Procedures Performed: Wound Number: 7 8 N/A Photos: No Photos No Photos N/A Left T  Fourth oe Left  Calcaneus N/A Wound Location: Gradually Appeared Pressure Injury N/A Wounding Event: Diabetic Wound/Ulcer of the Lower Diabetic Wound/Ulcer of the Lower N/A Primary Etiology: Extremity Extremity Chronic sinus problems/congestion, Chronic sinus problems/congestion, N/A Comorbid History: Anemia, Chronic Obstructive Anemia, Chronic Obstructive Pulmonary Disease (COPD), Pulmonary Disease (COPD), Congestive Heart Failure, Congestive Heart Failure, Hypertension, Peripheral Venous Hypertension, Peripheral Venous Disease, Type II Diabetes, Dementia, Disease, Type II Diabetes, Dementia, Neuropathy Neuropathy 05/05/2021 06/09/2021 N/A Date Acquired: 0 0 N/A Weeks of Treatment: Open Open N/A Wound Status: No No N/A Wound Recurrence: No No N/A Clustered Wound: 0.5x0.7x0.1 2.5x3x0.1 N/A Measurements L x W x D (cm) 0.275 5.89 N/A A (cm) : rea 0.027 0.589 N/A Volume (cm) : 0.00% 0.00% N/A % Reduction in Area: 0.00% 0.00% N/A % Reduction in Volume: Grade 2 Grade 2 N/A Classification: Medium Medium N/A Exudate A mount: Serosanguineous Serosanguineous N/A Exudate Type: red, brown red, brown N/A Exudate Color: Flat and Intact Flat and Intact N/A Wound Margin: Small (1-33%) Large (67-100%) N/A Granulation A mount: Pink Red, Pink N/A Granulation Quality: Large (67-100%) None Present (0%) N/A Necrotic A mount: Eschar, Adherent Slough N/A N/A Necrotic Tissue: Fat Layer (Subcutaneous Tissue): Yes Fat Layer (Subcutaneous Tissue): Yes N/A Exposed Structures: Bone: Yes Fascia: No Fascia: No Tendon: No Tendon: No Muscle: No Muscle: No Joint: No Joint: No Bone: No None None N/A Epithelialization: Debridement - Selective/Open Wound N/A N/A Debridement: Pre-procedure Verification/Time Out 09:55 N/A N/A Taken: Slough N/A N/A Tissue Debrided: Non-Viable Tissue N/A N/A Level: 0.35 N/A N/A Debridement A (sq cm): rea Curette N/A N/A Instrument: Minimum N/A  N/A Bleeding: Pressure N/A N/A Hemostasis A chieved: 0 N/A N/A Procedural Pain: 0 N/A N/A Post Procedural Pain: Procedure was tolerated well N/A N/A Debridement Treatment Response: 0.5x0.7x0.1 N/A N/A Post Debridement Measurements L x W x D (cm) 0.027 N/A N/A Post Debridement Volume: (cm) Debridement N/A N/A Procedures Performed: Treatment Notes Electronic Signature(s) Signed: 06/09/2021 12:04:57 PM By: Fredirick Maudlin MD FACS Entered By: Fredirick Maudlin on 06/09/2021 12:04:56 -------------------------------------------------------------------------------- Multi-Disciplinary Care Plan Details Patient Name: Date of Service: [name]. 06/09/2021 9:00 A M Medical Record Number: [id number] Patient Account Number: [id number] Date of Birth/Sex: Treating RN: [birth year] (76 y.o. [name] Primary Care Cathleen Yagi: Bing Matter Other Clinician: Referring Wilfrido Luedke: Treating Mittie Knittel/Extender: Donia Ast in Treatment: 0 Multidisciplinary Care Plan reviewed with physician Active Inactive Abuse / Safety / Falls / Self Care Management Nursing Diagnoses: Potential for falls Potential for injury related to falls Goals: Patient will not experience any injury related to falls Date Initiated: 06/09/2021 Target Resolution Date: 07/09/2021 Goal Status: Active Patient/caregiver will verbalize/demonstrate measures taken to prevent injury and/or falls Date Initiated: 06/09/2021 Target Resolution Date: 07/09/2021 Goal Status: Active Interventions: Assess Activities of Daily Living upon admission and as needed Assess fall risk on admission and as needed Assess: immobility, friction, shearing, incontinence upon admission and as needed Assess impairment of mobility on admission and as needed per policy Assess personal safety and home safety (as indicated) on admission and as needed Assess self care needs on admission and as needed Provide education on  fall prevention Provide education on personal and home safety Notes: Nutrition Nursing Diagnoses: Potential for alteratiion in Nutrition/Potential for imbalanced nutrition Goals: Patient/caregiver agrees to and verbalizes understanding of need to use nutritional supplements and/or vitamins as prescribed Date Initiated: 06/09/2021 Target Resolution Date: 07/09/2021 Goal Status: Active Patient/caregiver will maintain therapeutic glucose control Date Initiated: 06/09/2021 Target Resolution Date: 07/09/2021  Goal Status: Active Interventions: Assess HgA1c results as ordered upon admission and as needed Assess patient nutrition upon admission and as needed per policy Provide education on elevated blood sugars and impact on wound healing Provide education on nutrition Notes: Wound/Skin Impairment Nursing Diagnoses: Impaired tissue integrity Knowledge deficit related to ulceration/compromised skin integrity Goals: Patient/caregiver will verbalize understanding of skin care regimen Date Initiated: 06/09/2021 Target Resolution Date: 07/09/2021 Goal Status: Active Interventions: Assess patient/caregiver ability to obtain necessary supplies Assess patient/caregiver ability to perform ulcer/skin care regimen upon admission and as needed Assess ulceration(s) every visit Provide education on ulcer and skin care Notes: Electronic Signature(s) Signed: 06/09/2021 6:27:47 PM By: Levan Hurst RN, BSN Entered By: Levan Hurst on 06/09/2021 17:00:15 -------------------------------------------------------------------------------- Pain Assessment Details Patient Name: Date of Service: [name]. 06/09/2021 9:00 A M Medical Record Number: [id number] Patient Account Number: [id number] Date of Birth/Sex: Treating RN: [birth year] (76 y.o. F) Primary Care Pritika Alvarez: Bing Matter Other Clinician: Referring Ronan Dion: Treating Julie-Anne Torain/Extender: Donia Ast in Treatment:  0 Active Problems Location of Pain Severity and Description of Pain Patient Has Paino No Patient Has Paino No Site Locations Pain Management and Medication Current Pain Management: Electronic Signature(s) Signed: 06/09/2021 9:49:12 AM By: Sandre Kitty Entered By: Sandre Kitty on 06/09/2021 08:44:43 -------------------------------------------------------------------------------- Patient/Caregiver Education Details Patient Name: Date of Service: [place], [place] TSY L. 3/8/2023andnbsp9:00 [place] Record Number: [id number] Patient Account Number: [id number] Date of Birth/Gender: Treating RN: 08/31/1945 (76 y.o. [name] Primary Care Physician: Bing Matter Other Clinician: Referring Physician: Treating Physician/Extender: Donia Ast in Treatment: 0 Education Assessment Education Provided To: Patient Education Topics Provided Nutrition: Methods: Explain/Verbal Responses: State content correctly Safety: Methods: Explain/Verbal Responses: State content correctly Wound/Skin Impairment: Methods: Explain/Verbal Responses: State content correctly Electronic Signature(s) Signed: 06/09/2021 6:27:47 PM By: Levan Hurst RN, BSN Entered By: Levan Hurst on 06/09/2021 17:00:29 -------------------------------------------------------------------------------- Wound Assessment Details Patient Name: Date of Service: [name]. 06/09/2021 9:00 A M Medical Record Number: [id number] Patient Account Number: [id number] Date of Birth/Sex: Treating RN: Jun 02, 1945 (76 y.o. Donalda Ewings Primary Care Aeriana Speece: Bing Matter Other Clinician: Referring Jacques Fife: Treating Sashia Campas/Extender: Donia Ast in Treatment: 0 Wound Status Wound Number: 1 Primary T be determined o Etiology: Wound Location: Right, Anterior Lower Leg Wound Open Wounding Event: Gradually Appeared Status: Date Acquired:  05/05/2021 Comorbid Chronic sinus problems/congestion, Anemia, Chronic Obstructive Weeks Of Treatment: 0 History: Pulmonary Disease (COPD), Congestive Heart Failure, Clustered Wound: Yes Hypertension, Peripheral Venous Disease, Type II Diabetes, Dementia, Neuropathy Photos Photo Uploaded By: Donavan Burnet on 06/09/2021 17:50:43 Wound Measurements Length: (cm) 1.6 Width: (cm) 1 Depth: (cm) 0.1 Area: (cm) 1.257 Volume: (cm) 0.126 % Reduction in Area: 0% % Reduction in Volume: 0% Epithelialization: None Tunneling: No Undermining: No Wound Description Classification: Full Thickness Without Exposed Support Structures Exudate Amount: Medium Exudate Type: Serosanguineous Exudate Color: red, brown Foul Odor After Cleansing: No Slough/Fibrino Yes Wound Bed Granulation Amount: Small (1-33%) Exposed Structure Granulation Quality: Pale Fascia Exposed: No Necrotic Amount: Large (67-100%) Fat Layer (Subcutaneous Tissue) Exposed: Yes Necrotic Quality: Adherent Slough Tendon Exposed: No Muscle Exposed: No Joint Exposed: No Bone Exposed: No Electronic Signature(s) Signed: 06/09/2021 5:20:27 PM By: Sharyn Creamer RN, BSN Entered By: Sharyn Creamer on 06/09/2021 09:26:29 -------------------------------------------------------------------------------- Wound Assessment Details Patient Name: Date of Service: [name]. 06/09/2021 9:00 A M Medical Record Number: [id number] Patient Account Number: [id number] Date of Birth/Sex: Treating RN: [birth year] (76 y.o. Donalda Ewings Primary  Care Xitlalli Newhard: Bing Matter Other Clinician: Referring Vernona Peake: Treating Ladale Sherburn/Extender: Donia Ast in Treatment: 0 Wound Status Wound Number: 2 Primary T be determined o Etiology: Wound Location: Right, Posterior Lower Leg Wound Open Wounding Event: Gradually Appeared Status: Date Acquired: 05/06/2021 Comorbid Chronic sinus problems/congestion, Anemia,  Chronic Obstructive Weeks Of Treatment: 0 History: Pulmonary Disease (COPD), Congestive Heart Failure, Clustered Wound: Yes Hypertension, Peripheral Venous Disease, Type II Diabetes, Dementia, Neuropathy Photos Photo Uploaded By: Donavan Burnet on 06/09/2021 17:50:43 Wound Measurements Length: (cm) 0.5 Width: (cm) 0.5 Depth: (cm) 0.2 Area: (cm) 0.196 Volume: (cm) 0.039 % Reduction in Area: 0% % Reduction in Volume: 0% Epithelialization: None Tunneling: No Undermining: No Wound Description Classification: Full Thickness Without Exposed Support Structures Foul Odor After Cleansing: No Slough/Fibrino Yes Wound Bed Granulation Amount: Small (1-33%) Exposed Structure Granulation Quality: Pink, Pale Fascia Exposed: No Necrotic Amount: Large (67-100%) Fat Layer (Subcutaneous Tissue) Exposed: Yes Necrotic Quality: Adherent Slough Tendon Exposed: No Muscle Exposed: No Joint Exposed: No Bone Exposed: No Electronic Signature(s) Signed: 06/09/2021 5:20:27 PM By: Sharyn Creamer RN, BSN Entered By: Sharyn Creamer on 06/09/2021 09:28:50 -------------------------------------------------------------------------------- Wound Assessment Details Patient Name: Date of Service: [name]. 06/09/2021 9:00 A M Medical Record Number: [id number] Patient Account Number: [id number] Date of Birth/Sex: Treating RN: 06-19-1945 (76 y.o. Donalda Ewings Primary Care Koi Yarbro: Bing Matter Other Clinician: Referring Quynn Vilchis: Treating Nyari Olsson/Extender: Willodean Rosenthal Weeks in Treatment: 0 Wound Status Wound Number: 3 Primary Diabetic Wound/Ulcer of the Lower Extremity Etiology: Wound Location: Left, Anterior Lower Leg Wound Open Wounding Event: Gradually Appeared Status: Date Acquired: 05/05/2021 Comorbid Chronic sinus problems/congestion, Anemia, Chronic Obstructive Weeks Of Treatment: 0 History: Pulmonary Disease (COPD), Congestive Heart Failure, Clustered  Wound: Yes Hypertension, Peripheral Venous Disease, Type II Diabetes, Dementia, Neuropathy Photos Photo Uploaded By: Donavan Burnet on 06/09/2021 17:51:09 Wound Measurements Length: (cm) 0.5 Width: (cm) 0.5 Depth: (cm) 0.1 Area: (cm) 0.196 Volume: (cm) 0.02 % Reduction in Area: % Reduction in Volume: Epithelialization: None Tunneling: No Undermining: No Wound Description Classification: Grade 2 Wound Margin: Flat and Intact Exudate Amount: Medium Exudate Type: Serosanguineous Exudate Color: red, brown Foul Odor After Cleansing: No Slough/Fibrino Yes Wound Bed Granulation Amount: Small (1-33%) Exposed Structure Granulation Quality: Pink, Pale Fascia Exposed: No Necrotic Amount: Large (67-100%) Fat Layer (Subcutaneous Tissue) Exposed: Yes Necrotic Quality: Adherent Slough Tendon Exposed: No Muscle Exposed: No Joint Exposed: No Bone Exposed: No Treatment Notes Wound #3 (Lower Leg) Wound Laterality: Left, Anterior Cleanser Soap and Water Discharge Instruction: May shower and wash wound with dial antibacterial soap and water prior to dressing change. Wound Cleanser Discharge Instruction: Cleanse the wound with wound cleanser prior to applying a clean dressing using gauze sponges, not tissue or cotton balls. Peri-Wound Care Sween Lotion (Moisturizing lotion) Discharge Instruction: Apply moisturizing lotion as directed Topical Primary Dressing KerraCel Ag Gelling Fiber Dressing, 2x2 in (silver alginate) Discharge Instruction: Apply silver alginate to wound bed as instructed Secondary Dressing Woven Gauze Sponge, Non-Sterile 4x4 in Discharge Instruction: Apply over primary dressing as directed. Secured With Compression Wrap Kerlix Roll 4.5x3.1 (in/yd) Discharge Instruction: Apply Kerlix and Coban compression as directed. Coban Self-Adherent Wrap 4x5 (in/yd) Discharge Instruction: Apply over Kerlix as directed. Compression Stockings Add-Ons Electronic  Signature(s) Signed: 06/09/2021 5:20:27 PM By: Sharyn Creamer RN, BSN Entered By: Sharyn Creamer on 06/09/2021 09:31:50 -------------------------------------------------------------------------------- Wound Assessment Details Patient Name: Date of Service: [name]. 06/09/2021 9:00 [facility] Record Number: [id number] Patient Account Number: [id number] Date of  Birth/Sex: Treating RN: 1945-08-18 (76 y.o. Donalda Ewings Primary Care Majid Mccravy: Bing Matter Other Clinician: Referring Risa Auman: Treating Chauntae Hults/Extender: Willodean Rosenthal Weeks in Treatment: 0 Wound Status Wound Number: 4 Primary Diabetic Wound/Ulcer of the Lower Extremity Etiology: Wound Location: Left, Medial Lower Leg Wound Open Wounding Event: Gradually Appeared Status: Date Acquired: 05/05/2021 Comorbid Chronic sinus problems/congestion, Anemia, Chronic Obstructive Weeks Of Treatment: 0 History: Pulmonary Disease (COPD), Congestive Heart Failure, Clustered Wound: Yes Hypertension, Peripheral Venous Disease, Type II Diabetes, Dementia, Neuropathy Photos Photo Uploaded By: Donavan Burnet on 06/09/2021 17:51:09 Wound Measurements Length: (cm) Width: (cm) Depth: (cm) Area: (cm) Volume: (cm) 0.5 % Reduction in Area: 0.5 % Reduction in Volume: 0.1 0.196 0.02 Wound Description Classification: Grade 2 Exudate Amount: Medium Exudate Type: Serosanguineous Exudate Color: red, brown Foul Odor After Cleansing: No Slough/Fibrino Yes Wound Bed Granulation Amount: Small (1-33%) Exposed Structure Granulation Quality: Pink, Pale Fascia Exposed: No Necrotic Amount: Large (67-100%) Fat Layer (Subcutaneous Tissue) Exposed: Yes Necrotic Quality: Adherent Slough Tendon Exposed: No Muscle Exposed: No Joint Exposed: No Bone Exposed: No Treatment Notes Wound #4 (Lower Leg) Wound Laterality: Left, Medial Cleanser Soap and Water Discharge Instruction: May shower and wash wound with  dial antibacterial soap and water prior to dressing change. Wound Cleanser Discharge Instruction: Cleanse the wound with wound cleanser prior to applying a clean dressing using gauze sponges, not tissue or cotton balls. Peri-Wound Care Sween Lotion (Moisturizing lotion) Discharge Instruction: Apply moisturizing lotion as directed Topical Primary Dressing KerraCel Ag Gelling Fiber Dressing, 2x2 in (silver alginate) Discharge Instruction: Apply silver alginate to wound bed as instructed Secondary Dressing Woven Gauze Sponge, Non-Sterile 4x4 in Discharge Instruction: Apply over primary dressing as directed. Secured With Compression Wrap Kerlix Roll 4.5x3.1 (in/yd) Discharge Instruction: Apply Kerlix and Coban compression as directed. Coban Self-Adherent Wrap 4x5 (in/yd) Discharge Instruction: Apply over Kerlix as directed. Compression Stockings Add-Ons Electronic Signature(s) Signed: 06/09/2021 5:20:27 PM By: Sharyn Creamer RN, BSN Entered By: Sharyn Creamer on 06/09/2021 09:33:41 -------------------------------------------------------------------------------- Wound Assessment Details Patient Name: Date of Service: [name]. 06/09/2021 9:00 A M Medical Record Number: [id number] Patient Account Number: [id number] Date of Birth/Sex: Treating RN: June 20, 1945 (76 y.o. Donalda Ewings Primary Care Savanah Bayles: Bing Matter Other Clinician: Referring Donaldson Richter: Treating Lashea Goda/Extender: Willodean Rosenthal Weeks in Treatment: 0 Wound Status Wound Number: 5 Primary Diabetic Wound/Ulcer of the Lower Extremity Etiology: Wound Location: Left T Third oe Wound Open Wounding Event: Gradually Appeared Status: Date Acquired: 05/05/2021 Comorbid Chronic sinus problems/congestion, Anemia, Chronic Obstructive Weeks Of Treatment: 0 History: Pulmonary Disease (COPD), Congestive Heart Failure, Clustered Wound: No Hypertension, Peripheral Venous Disease, Type II  Diabetes, Dementia, Neuropathy Photos Photo Uploaded By: Donavan Burnet on 06/09/2021 17:52:05 Wound Measurements Length: (cm) 0.8 Width: (cm) 1 Depth: (cm) 0.1 Area: (cm) 0.628 Volume: (cm) 0.063 % Reduction in Area: 0% % Reduction in Volume: 0% Epithelialization: None Tunneling: No Undermining: No Wound Description Classification: Grade 2 Wound Margin: Flat and Intact Exudate Amount: Medium Exudate Type: Serosanguineous Exudate Color: red, brown Foul Odor After Cleansing: No Slough/Fibrino Yes Wound Bed Granulation Amount: Small (1-33%) Exposed Structure Granulation Quality: Pink Fascia Exposed: No Necrotic Amount: Large (67-100%) Fat Layer (Subcutaneous Tissue) Exposed: Yes Necrotic Quality: Eschar, Adherent Slough Tendon Exposed: No Muscle Exposed: No Joint Exposed: No Bone Exposed: Yes Treatment Notes Wound #5 (Toe Third) Wound Laterality: Left Cleanser Wound Cleanser Discharge Instruction: Cleanse the wound with wound cleanser prior to applying a clean dressing using gauze sponges, not tissue or cotton balls.  Peri-Wound Care Topical Primary Dressing Promogran Prisma Matrix, 4.34 (sq in) (silver collagen) Discharge Instruction: Moisten collagen with saline or hydrogel Secondary Dressing Woven Gauze Sponges 2x2 in Discharge Instruction: Apply over primary dressing as directed. Secured With Conforming Stretch Gauze Bandage, Sterile 2x75 (in/in) Discharge Instruction: Secure with stretch gauze as directed. Paper Tape, 1x10 (in/yd) Discharge Instruction: Secure dressing with tape as directed. Compression Wrap Compression Stockings Add-Ons Electronic Signature(s) Signed: 06/09/2021 5:20:27 PM By: Sharyn Creamer RN, BSN Signed: 06/09/2021 6:27:47 PM By: Levan Hurst RN, BSN Entered By: Levan Hurst on 06/09/2021 10:00:52 -------------------------------------------------------------------------------- Wound Assessment Details Patient Name: Date of  Service: [name]. 06/09/2021 9:00 A M Medical Record Number: [id number] Patient Account Number: [id number] Date of Birth/Sex: Treating RN: 05/02/1945 (76 y.o. Donalda Ewings Primary Care Dore Oquin: Bing Matter Other Clinician: Referring Jerrie Gullo: Treating Stephaney Steven/Extender: Willodean Rosenthal Weeks in Treatment: 0 Wound Status Wound Number: 6 Primary Diabetic Wound/Ulcer of the Lower Extremity Etiology: Wound Location: Left Metatarsal head first Wound Open Wounding Event: Gradually Appeared Status: Date Acquired: 05/05/2021 Comorbid Chronic sinus problems/congestion, Anemia, Chronic Obstructive Weeks Of Treatment: 0 History: Pulmonary Disease (COPD), Congestive Heart Failure, Clustered Wound: Yes Hypertension, Peripheral Venous Disease, Type II Diabetes, Dementia, Neuropathy Photos Photo Uploaded By: Donavan Burnet on 06/09/2021 17:52:05 Wound Measurements Length: (cm) 1 Width: (cm) 1.1 Depth: (cm) 0.1 Area: (cm) 0.864 Volume: (cm) 0.086 % Reduction in Area: 0% % Reduction in Volume: 0% Epithelialization: None Tunneling: No Undermining: No Wound Description Classification: Grade 2 Wound Margin: Flat and Intact Exudate Amount: Medium Exudate Type: Serosanguineous Exudate Color: red, brown Foul Odor After Cleansing: No Slough/Fibrino Yes Wound Bed Granulation Amount: None Present (0%) Exposed Structure Necrotic Amount: Large (67-100%) Fascia Exposed: No Necrotic Quality: Eschar, Adherent Slough Fat Layer (Subcutaneous Tissue) Exposed: Yes Tendon Exposed: No Muscle Exposed: No Joint Exposed: No Bone Exposed: No Electronic Signature(s) Signed: 06/09/2021 5:20:27 PM By: Sharyn Creamer RN, BSN Entered By: Sharyn Creamer on 06/09/2021 09:40:17 -------------------------------------------------------------------------------- Wound Assessment Details Patient Name: Date of Service: [name]. 06/09/2021 9:00 A M Medical Record  Number: [id number] Patient Account Number: [id number] Date of Birth/Sex: Treating RN: Mar 02, 1946 (76 y.o. Donalda Ewings Primary Care Ausha Sieh: Bing Matter Other Clinician: Referring Maurizio Geno: Treating Deanglo Hissong/Extender: Willodean Rosenthal Weeks in Treatment: 0 Wound Status Wound Number: 7 Primary Diabetic Wound/Ulcer of the Lower Extremity Etiology: Wound Location: Left T Fourth oe Wound Open Wounding Event: Gradually Appeared Status: Date Acquired: 05/05/2021 Comorbid Chronic sinus problems/congestion, Anemia, Chronic Obstructive Weeks Of Treatment: 0 History: Pulmonary Disease (COPD), Congestive Heart Failure, Clustered Wound: No Hypertension, Peripheral Venous Disease, Type II Diabetes, Dementia, Neuropathy Photos Photo Uploaded By: Donavan Burnet on 06/09/2021 17:52:58 Wound Measurements Length: (cm) 0.5 Width: (cm) 0.7 Depth: (cm) 0.1 Area: (cm) 0.275 Volume: (cm) 0.027 % Reduction in Area: 0% % Reduction in Volume: 0% Epithelialization: None Tunneling: No Undermining: No Wound Description Classification: Grade 2 Wound Margin: Flat and Intact Exudate Amount: Medium Exudate Type: Serosanguineous Exudate Color: red, brown Foul Odor After Cleansing: No Slough/Fibrino Yes Wound Bed Granulation Amount: Small (1-33%) Exposed Structure Granulation Quality: Pink Fascia Exposed: No Necrotic Amount: Large (67-100%) Fat Layer (Subcutaneous Tissue) Exposed: Yes Necrotic Quality: Eschar, Adherent Slough Tendon Exposed: No Muscle Exposed: No Joint Exposed: No Bone Exposed: Yes Treatment Notes Wound #7 (Toe Fourth) Wound Laterality: Left Cleanser Wound Cleanser Discharge Instruction: Cleanse the wound with wound cleanser prior to applying a clean dressing using gauze sponges, not tissue or cotton balls. Peri-Wound Care Topical  Primary Dressing Promogran Prisma Matrix, 4.34 (sq in) (silver collagen) Discharge Instruction: Moisten collagen  with saline or hydrogel Secondary Dressing Woven Gauze Sponges 2x2 in Discharge Instruction: Apply over primary dressing as directed. Secured With Conforming Stretch Gauze Bandage, Sterile 2x75 (in/in) Discharge Instruction: Secure with stretch gauze as directed. Paper Tape, 1x10 (in/yd) Discharge Instruction: Secure dressing with tape as directed. Compression Wrap Compression Stockings Add-Ons Electronic Signature(s) Signed: 06/09/2021 5:20:27 PM By: Sharyn Creamer RN, BSN Signed: 06/09/2021 6:27:47 PM By: Levan Hurst RN, BSN Entered By: Levan Hurst on 06/09/2021 10:03:00 -------------------------------------------------------------------------------- Wound Assessment Details Patient Name: Date of Service: [name]. 06/09/2021 9:00 A M Medical Record Number: [id number] Patient Account Number: [id number] Date of Birth/Sex: Treating RN: Mar 01, 1946 (76 y.o. [name] Primary Care August Longest: Bing Matter Other Clinician: Referring Elior Robinette: Treating Regan Llorente/Extender: Willodean Rosenthal Weeks in Treatment: 0 Wound Status Wound Number: 8 Primary Diabetic Wound/Ulcer of the Lower Extremity Etiology: Wound Location: Left Calcaneus Wound Open Wounding Event: Pressure Injury Status: Date Acquired: 06/09/2021 Comorbid Chronic sinus problems/congestion, Anemia, Chronic Obstructive Weeks Of Treatment: 0 History: Pulmonary Disease (COPD), Congestive Heart Failure, Clustered Wound: No Hypertension, Peripheral Venous Disease, Type II Diabetes, Dementia, Neuropathy Wound Measurements Length: (cm) 2.5 Width: (cm) 3 Depth: (cm) 0.1 Area: (cm) 5.89 Volume: (cm) 0.589 % Reduction in Area: 0% % Reduction in Volume: 0% Epithelialization: None Tunneling: No Undermining: No Wound Description Classification: Grade 2 Wound Margin: Flat and Intact Exudate Amount: Medium Exudate Type: Serosanguineous Exudate Color: red, brown Foul Odor After Cleansing:  No Slough/Fibrino No Wound Bed Granulation Amount: Large (67-100%) Exposed Structure Granulation Quality: Red, Pink Fascia Exposed: No Necrotic Amount: None Present (0%) Fat Layer (Subcutaneous Tissue) Exposed: Yes Tendon Exposed: No Muscle Exposed: No Joint Exposed: No Bone Exposed: No Treatment Notes Wound #8 (Calcaneus) Wound Laterality: Left Cleanser Soap and Water Discharge Instruction: May shower and wash wound with dial antibacterial soap and water prior to dressing change. Wound Cleanser Discharge Instruction: Cleanse the wound with wound cleanser prior to applying a clean dressing using gauze sponges, not tissue or cotton balls. Peri-Wound Care Sween Lotion (Moisturizing lotion) Discharge Instruction: Apply moisturizing lotion as directed Topical Primary Dressing KerraCel Ag Gelling Fiber Dressing, 2x2 in (silver alginate) Discharge Instruction: Apply silver alginate to wound bed as instructed Secondary Dressing Woven Gauze Sponge, Non-Sterile 4x4 in Discharge Instruction: Apply over primary dressing as directed. ALLEVYN Heel 4 1/2in x 5 1/2in / 10.5cm x 13.5cm Discharge Instruction: Apply over primary dressing as directed. Secured With Compression Wrap Kerlix Roll 4.5x3.1 (in/yd) Discharge Instruction: Apply Kerlix and Coban compression as directed. Coban Self-Adherent Wrap 4x5 (in/yd) Discharge Instruction: Apply over Kerlix as directed. Compression Stockings Add-Ons Electronic Signature(s) Signed: 06/09/2021 6:27:47 PM By: Levan Hurst RN, BSN Entered By: Levan Hurst on 06/09/2021 10:13:01 -------------------------------------------------------------------------------- Vitals Details Patient Name: Date of Service: Gerlean Ren, Carlisle L. 06/09/2021 9:00 A M Medical Record Number: [id number] Patient Account Number: [id number] Date of Birth/Sex: Treating RN: [birth year] (76 y.o. F) Primary Care Eriverto Byrnes: Bing Matter Other Clinician: Referring  Reiss Mowrey: Treating Idaly Verret/Extender: Donia Ast in Treatment: 0 Vital Signs Time Taken: 07:58 Temperature (F): 98.3 Height (in): 67 Pulse (bpm): 114 Source: Stated Respiratory Rate (breaths/min): 18 Weight (lbs): 183 Blood Pressure (mmHg): 172/100 Source: Stated Reference Range: 80 - 120 mg / dl Body Mass Index (BMI): 28.7 Notes pt states did not check sugars this morning, pt states cannot recall range sugar usually runs in Electronic Signature(s) Signed: 06/09/2021 9:49:12 AM By: Rudean Curt,  Destiny Entered By: Sandre Kitty on 06/09/2021 08:20:31

## 2021-06-10 ENCOUNTER — Emergency Department (HOSPITAL_COMMUNITY): Payer: 59

## 2021-06-10 ENCOUNTER — Encounter (HOSPITAL_COMMUNITY): Payer: Self-pay | Admitting: Emergency Medicine

## 2021-06-10 ENCOUNTER — Inpatient Hospital Stay (HOSPITAL_COMMUNITY)
Admission: EM | Admit: 2021-06-10 | Discharge: 2021-06-21 | DRG: 308 | Disposition: A | Discharge status: Skilled Nursing Facility | Payer: 59 | Source: Skilled Nursing Facility | Attending: Internal Medicine | Admitting: Internal Medicine

## 2021-06-10 DIAGNOSIS — M069 Rheumatoid arthritis, unspecified: Secondary | ICD-10-CM | POA: Diagnosis present

## 2021-06-10 DIAGNOSIS — Z8249 Family history of ischemic heart disease and other diseases of the circulatory system: Secondary | ICD-10-CM

## 2021-06-10 DIAGNOSIS — Z22322 Carrier or suspected carrier of Methicillin resistant Staphylococcus aureus: Secondary | ICD-10-CM

## 2021-06-10 DIAGNOSIS — N390 Urinary tract infection, site not specified: Secondary | ICD-10-CM | POA: Diagnosis not present

## 2021-06-10 DIAGNOSIS — K219 Gastro-esophageal reflux disease without esophagitis: Secondary | ICD-10-CM | POA: Diagnosis present

## 2021-06-10 DIAGNOSIS — W19XXXA Unspecified fall, initial encounter: Secondary | ICD-10-CM | POA: Diagnosis present

## 2021-06-10 DIAGNOSIS — B952 Enterococcus as the cause of diseases classified elsewhere: Secondary | ICD-10-CM | POA: Diagnosis present

## 2021-06-10 DIAGNOSIS — I739 Peripheral vascular disease, unspecified: Secondary | ICD-10-CM | POA: Diagnosis not present

## 2021-06-10 DIAGNOSIS — E43 Unspecified severe protein-calorie malnutrition: Secondary | ICD-10-CM | POA: Diagnosis present

## 2021-06-10 DIAGNOSIS — R451 Restlessness and agitation: Secondary | ICD-10-CM

## 2021-06-10 DIAGNOSIS — Z1624 Resistance to multiple antibiotics: Secondary | ICD-10-CM | POA: Diagnosis not present

## 2021-06-10 DIAGNOSIS — E876 Hypokalemia: Secondary | ICD-10-CM | POA: Diagnosis not present

## 2021-06-10 DIAGNOSIS — R7881 Bacteremia: Secondary | ICD-10-CM | POA: Diagnosis not present

## 2021-06-10 DIAGNOSIS — E785 Hyperlipidemia, unspecified: Secondary | ICD-10-CM | POA: Diagnosis present

## 2021-06-10 DIAGNOSIS — L89312 Pressure ulcer of right buttock, stage 2: Secondary | ICD-10-CM | POA: Diagnosis present

## 2021-06-10 DIAGNOSIS — E1169 Type 2 diabetes mellitus with other specified complication: Secondary | ICD-10-CM | POA: Diagnosis present

## 2021-06-10 DIAGNOSIS — L97429 Non-pressure chronic ulcer of left heel and midfoot with unspecified severity: Secondary | ICD-10-CM | POA: Diagnosis not present

## 2021-06-10 DIAGNOSIS — Y92129 Unspecified place in nursing home as the place of occurrence of the external cause: Secondary | ICD-10-CM

## 2021-06-10 DIAGNOSIS — E1151 Type 2 diabetes mellitus with diabetic peripheral angiopathy without gangrene: Secondary | ICD-10-CM | POA: Diagnosis present

## 2021-06-10 DIAGNOSIS — R410 Disorientation, unspecified: Secondary | ICD-10-CM | POA: Diagnosis not present

## 2021-06-10 DIAGNOSIS — S0003XA Contusion of scalp, initial encounter: Secondary | ICD-10-CM | POA: Diagnosis present

## 2021-06-10 DIAGNOSIS — G9340 Encephalopathy, unspecified: Secondary | ICD-10-CM | POA: Diagnosis not present

## 2021-06-10 DIAGNOSIS — D649 Anemia, unspecified: Secondary | ICD-10-CM | POA: Diagnosis present

## 2021-06-10 DIAGNOSIS — Z20822 Contact with and (suspected) exposure to covid-19: Secondary | ICD-10-CM | POA: Diagnosis present

## 2021-06-10 DIAGNOSIS — Z66 Do not resuscitate: Secondary | ICD-10-CM | POA: Diagnosis present

## 2021-06-10 DIAGNOSIS — Z888 Allergy status to other drugs, medicaments and biological substances status: Secondary | ICD-10-CM

## 2021-06-10 DIAGNOSIS — Z7984 Long term (current) use of oral hypoglycemic drugs: Secondary | ICD-10-CM

## 2021-06-10 DIAGNOSIS — B955 Unspecified streptococcus as the cause of diseases classified elsewhere: Secondary | ICD-10-CM | POA: Diagnosis not present

## 2021-06-10 DIAGNOSIS — E872 Acidosis, unspecified: Secondary | ICD-10-CM | POA: Diagnosis not present

## 2021-06-10 DIAGNOSIS — I10 Essential (primary) hypertension: Secondary | ICD-10-CM | POA: Diagnosis present

## 2021-06-10 DIAGNOSIS — R0600 Dyspnea, unspecified: Secondary | ICD-10-CM

## 2021-06-10 DIAGNOSIS — L89622 Pressure ulcer of left heel, stage 2: Secondary | ICD-10-CM | POA: Diagnosis present

## 2021-06-10 DIAGNOSIS — I4821 Permanent atrial fibrillation: Principal | ICD-10-CM | POA: Diagnosis present

## 2021-06-10 DIAGNOSIS — E11621 Type 2 diabetes mellitus with foot ulcer: Secondary | ICD-10-CM | POA: Diagnosis present

## 2021-06-10 DIAGNOSIS — I110 Hypertensive heart disease with heart failure: Secondary | ICD-10-CM | POA: Diagnosis present

## 2021-06-10 DIAGNOSIS — I4891 Unspecified atrial fibrillation: Secondary | ICD-10-CM

## 2021-06-10 DIAGNOSIS — K6812 Psoas muscle abscess: Secondary | ICD-10-CM | POA: Diagnosis present

## 2021-06-10 DIAGNOSIS — I5032 Chronic diastolic (congestive) heart failure: Secondary | ICD-10-CM | POA: Diagnosis present

## 2021-06-10 DIAGNOSIS — G9341 Metabolic encephalopathy: Secondary | ICD-10-CM | POA: Diagnosis not present

## 2021-06-10 DIAGNOSIS — Z79899 Other long term (current) drug therapy: Secondary | ICD-10-CM

## 2021-06-10 DIAGNOSIS — Z8673 Personal history of transient ischemic attack (TIA), and cerebral infarction without residual deficits: Secondary | ICD-10-CM

## 2021-06-10 DIAGNOSIS — L03116 Cellulitis of left lower limb: Secondary | ICD-10-CM | POA: Diagnosis not present

## 2021-06-10 DIAGNOSIS — L97529 Non-pressure chronic ulcer of other part of left foot with unspecified severity: Secondary | ICD-10-CM | POA: Diagnosis present

## 2021-06-10 DIAGNOSIS — B9620 Unspecified Escherichia coli [E. coli] as the cause of diseases classified elsewhere: Secondary | ICD-10-CM | POA: Diagnosis present

## 2021-06-10 DIAGNOSIS — B964 Proteus (mirabilis) (morganii) as the cause of diseases classified elsewhere: Secondary | ICD-10-CM | POA: Diagnosis present

## 2021-06-10 DIAGNOSIS — Z794 Long term (current) use of insulin: Secondary | ICD-10-CM

## 2021-06-10 DIAGNOSIS — Z96643 Presence of artificial hip joint, bilateral: Secondary | ICD-10-CM | POA: Diagnosis present

## 2021-06-10 DIAGNOSIS — Z792 Long term (current) use of antibiotics: Secondary | ICD-10-CM

## 2021-06-10 DIAGNOSIS — Z7952 Long term (current) use of systemic steroids: Secondary | ICD-10-CM

## 2021-06-10 DIAGNOSIS — R509 Fever, unspecified: Secondary | ICD-10-CM

## 2021-06-10 DIAGNOSIS — F419 Anxiety disorder, unspecified: Secondary | ICD-10-CM | POA: Diagnosis present

## 2021-06-10 DIAGNOSIS — L8990 Pressure ulcer of unspecified site, unspecified stage: Secondary | ICD-10-CM | POA: Diagnosis present

## 2021-06-10 DIAGNOSIS — I959 Hypotension, unspecified: Secondary | ICD-10-CM | POA: Diagnosis present

## 2021-06-10 DIAGNOSIS — Z6826 Body mass index (BMI) 26.0-26.9, adult: Secondary | ICD-10-CM

## 2021-06-10 DIAGNOSIS — L89302 Pressure ulcer of unspecified buttock, stage 2: Secondary | ICD-10-CM | POA: Diagnosis not present

## 2021-06-10 DIAGNOSIS — Y95 Nosocomial condition: Secondary | ICD-10-CM | POA: Diagnosis not present

## 2021-06-10 DIAGNOSIS — M05719 Rheumatoid arthritis with rheumatoid factor of unspecified shoulder without organ or systems involvement: Secondary | ICD-10-CM | POA: Diagnosis not present

## 2021-06-10 DIAGNOSIS — B999 Unspecified infectious disease: Secondary | ICD-10-CM | POA: Diagnosis not present

## 2021-06-10 LAB — TROPONIN I (HIGH SENSITIVITY)
Troponin I (High Sensitivity): 10 ng/L (ref <18)
Troponin I (High Sensitivity): 9 ng/L (ref <18)

## 2021-06-10 LAB — CBC WITH DIFFERENTIAL/PLATELET
Abs Immature Granulocytes: 0.06 10*3/uL (ref 0.00–0.07)
Basophils Absolute: 0.1 10*3/uL (ref 0.0–0.1)
Basophils Relative: 1 %
Eosinophils Absolute: 0.2 10*3/uL (ref 0.0–0.5)
Eosinophils Relative: 3 %
HCT: 38.5 % (ref 36.0–46.0)
Hemoglobin: 11.1 g/dL — ABNORMAL LOW (ref 12.0–15.0)
Immature Granulocytes: 1 %
Lymphocytes Relative: 18 %
Lymphs Abs: 1.4 10*3/uL (ref 0.7–4.0)
MCH: 25.1 pg — ABNORMAL LOW (ref 26.0–34.0)
MCHC: 28.8 g/dL — ABNORMAL LOW (ref 30.0–36.0)
MCV: 86.9 fL (ref 80.0–100.0)
Monocytes Absolute: 0.8 10*3/uL (ref 0.1–1.0)
Monocytes Relative: 10 %
Neutro Abs: 5.6 10*3/uL (ref 1.7–7.7)
Neutrophils Relative %: 67 %
Platelets: 298 10*3/uL (ref 150–400)
RBC: 4.43 MIL/uL (ref 3.87–5.11)
RDW: 19.6 % — ABNORMAL HIGH (ref 11.5–15.5)
WBC: 8.1 10*3/uL (ref 4.0–10.5)
nRBC: 0 % (ref 0.0–0.2)

## 2021-06-10 LAB — COMPREHENSIVE METABOLIC PANEL
ALT: 17 U/L (ref 0–44)
AST: 16 U/L (ref 15–41)
Albumin: 3 g/dL — ABNORMAL LOW (ref 3.5–5.0)
Alkaline Phosphatase: 148 U/L — ABNORMAL HIGH (ref 38–126)
Anion gap: 10 (ref 5–15)
BUN: 15 mg/dL (ref 8–23)
CO2: 29 mmol/L (ref 22–32)
Calcium: 8.8 mg/dL — ABNORMAL LOW (ref 8.9–10.3)
Chloride: 105 mmol/L (ref 98–111)
Creatinine, Ser: 0.64 mg/dL (ref 0.44–1.00)
GFR, Estimated: >60 mL/min (ref >60)
Glucose, Bld: 100 mg/dL — ABNORMAL HIGH (ref 70–99)
Potassium: 3.1 mmol/L — ABNORMAL LOW (ref 3.5–5.1)
Sodium: 144 mmol/L (ref 135–145)
Total Bilirubin: 1 mg/dL (ref 0.3–1.2)
Total Protein: 6.7 g/dL (ref 6.5–8.1)

## 2021-06-10 LAB — PROTIME-INR
INR: 1.3 — ABNORMAL HIGH (ref 0.8–1.2)
Prothrombin Time: 15.7 seconds — ABNORMAL HIGH (ref 11.4–15.2)

## 2021-06-10 LAB — LACTIC ACID, PLASMA: Lactic Acid, Venous: 1.5 mmol/L (ref 0.5–1.9)

## 2021-06-10 LAB — RESP PANEL BY RT-PCR (FLU A&B, COVID) ARPGX2
Influenza A by PCR: NEGATIVE
Influenza B by PCR: NEGATIVE
SARS Coronavirus 2 by RT PCR: NEGATIVE

## 2021-06-10 LAB — MRSA NEXT GEN BY PCR, NASAL: MRSA by PCR Next Gen: DETECTED — AB

## 2021-06-10 LAB — GLUCOSE, CAPILLARY
Glucose-Capillary: 131 mg/dL — ABNORMAL HIGH (ref 70–99)
Glucose-Capillary: 159 mg/dL — ABNORMAL HIGH (ref 70–99)

## 2021-06-10 LAB — BRAIN NATRIURETIC PEPTIDE: B Natriuretic Peptide: 644 pg/mL — ABNORMAL HIGH (ref 0.0–100.0)

## 2021-06-10 LAB — CBG MONITORING, ED: Glucose-Capillary: 107 mg/dL — ABNORMAL HIGH (ref 70–99)

## 2021-06-10 LAB — TSH: TSH: 2.266 u[IU]/mL (ref 0.350–4.500)

## 2021-06-10 LAB — APTT: aPTT: 30 seconds (ref 24–36)

## 2021-06-10 LAB — CK: Total CK: 18 U/L — ABNORMAL LOW (ref 38–234)

## 2021-06-10 MED ORDER — RIVAROXABAN 10 MG PO TABS: 10.0000 mg | ORAL_TABLET | Freq: Every day | ORAL | Status: DC

## 2021-06-10 MED ORDER — METOPROLOL TARTRATE 50 MG PO TABS
100.0000 mg | ORAL_TABLET | Freq: Two times a day (BID) | ORAL | Status: DC
  Administered 2021-06-10 – 2021-06-21 (×19): 100 mg via ORAL
  Filled 2021-06-10 (×20): qty 2

## 2021-06-10 MED ORDER — ONDANSETRON HCL 4 MG/2ML IJ SOLN: 4.0000 mg | Freq: Four times a day (QID) | INTRAMUSCULAR | Status: DC | PRN

## 2021-06-10 MED ORDER — POTASSIUM CHLORIDE CRYS ER 20 MEQ PO TBCR
40.0000 meq | EXTENDED_RELEASE_TABLET | Freq: Once | ORAL | Status: AC
  Administered 2021-06-10: 14:00:00 40 meq via ORAL
  Filled 2021-06-10: qty 2

## 2021-06-10 MED ORDER — ONDANSETRON HCL 4 MG PO TABS: 4.0000 mg | ORAL_TABLET | Freq: Four times a day (QID) | ORAL | Status: DC | PRN

## 2021-06-10 MED ORDER — INSULIN ASPART 100 UNIT/ML IJ SOLN
0.0000 [IU] | Freq: Three times a day (TID) | INTRAMUSCULAR | Status: DC
  Administered 2021-06-10: 16:00:00 2 [IU] via SUBCUTANEOUS
  Administered 2021-06-11: 3 [IU] via SUBCUTANEOUS
  Administered 2021-06-11 – 2021-06-13 (×5): 2 [IU] via SUBCUTANEOUS
  Administered 2021-06-13: 3 [IU] via SUBCUTANEOUS
  Administered 2021-06-14: 2 [IU] via SUBCUTANEOUS
  Administered 2021-06-15 (×2): 3 [IU] via SUBCUTANEOUS
  Administered 2021-06-16 – 2021-06-19 (×6): 2 [IU] via SUBCUTANEOUS
  Administered 2021-06-19: 5 [IU] via SUBCUTANEOUS
  Administered 2021-06-20: 2 [IU] via SUBCUTANEOUS

## 2021-06-10 MED ORDER — CEFADROXIL 500 MG PO CAPS
1000.0000 mg | ORAL_CAPSULE | Freq: Two times a day (BID) | ORAL | Status: DC
  Filled 2021-06-10 (×3): qty 2

## 2021-06-10 MED ORDER — KETOTIFEN FUMARATE 0.025 % OP SOLN
1.0000 [drp] | Freq: Two times a day (BID) | OPHTHALMIC | Status: DC
  Administered 2021-06-10 – 2021-06-21 (×17): 1 [drp] via OPHTHALMIC
  Filled 2021-06-10 (×2): qty 5

## 2021-06-10 MED ORDER — ALBUTEROL SULFATE HFA 108 (90 BASE) MCG/ACT IN AERS: 2.0000 | INHALATION_SPRAY | RESPIRATORY_TRACT | Status: DC | PRN

## 2021-06-10 MED ORDER — SODIUM CHLORIDE 0.9% FLUSH: 3.0000 mL | INTRAVENOUS | Status: DC | PRN

## 2021-06-10 MED ORDER — ACETAMINOPHEN 325 MG PO TABS
650.0000 mg | ORAL_TABLET | Freq: Four times a day (QID) | ORAL | Status: DC | PRN
  Administered 2021-06-13 – 2021-06-19 (×5): 650 mg via ORAL
  Filled 2021-06-10 (×5): qty 2

## 2021-06-10 MED ORDER — ASCORBIC ACID 500 MG PO TABS
500.0000 mg | ORAL_TABLET | Freq: Every day | ORAL | Status: DC
Start: 2021-06-10 — End: 2021-06-21
  Administered 2021-06-10 – 2021-06-21 (×10): 500 mg via ORAL
  Filled 2021-06-10 (×11): qty 1

## 2021-06-10 MED ORDER — RIVAROXABAN 20 MG PO TABS
20.0000 mg | ORAL_TABLET | Freq: Every day | ORAL | Status: DC
Start: 2021-06-10 — End: 2021-06-21
  Administered 2021-06-10 – 2021-06-21 (×12): 20 mg via ORAL
  Filled 2021-06-10 (×12): qty 1

## 2021-06-10 MED ORDER — LORATADINE 10 MG PO TABS
10.0000 mg | ORAL_TABLET | Freq: Every day | ORAL | Status: DC
  Administered 2021-06-10 – 2021-06-21 (×10): 10 mg via ORAL
  Filled 2021-06-10 (×11): qty 1

## 2021-06-10 MED ORDER — POLYETHYLENE GLYCOL 3350 17 G PO PACK: 17.0000 g | PACK | Freq: Two times a day (BID) | ORAL | Status: DC | PRN

## 2021-06-10 MED ORDER — MOMETASONE FURO-FORMOTEROL FUM 200-5 MCG/ACT IN AERO
2.0000 | INHALATION_SPRAY | Freq: Two times a day (BID) | RESPIRATORY_TRACT | Status: DC
  Administered 2021-06-10 – 2021-06-21 (×21): 2 via RESPIRATORY_TRACT
  Filled 2021-06-10 (×3): qty 8.8

## 2021-06-10 MED ORDER — ATORVASTATIN CALCIUM 10 MG PO TABS
20.0000 mg | ORAL_TABLET | Freq: Every day | ORAL | Status: DC
Start: 2021-06-10 — End: 2021-06-10
  Administered 2021-06-10: 10:00:00 20 mg via ORAL
  Filled 2021-06-10: qty 2

## 2021-06-10 MED ORDER — NITROGLYCERIN 0.4 MG SL SUBL: 0.4000 mg | SUBLINGUAL_TABLET | SUBLINGUAL | Status: DC | PRN

## 2021-06-10 MED ORDER — ACETAMINOPHEN 650 MG RE SUPP
650.0000 mg | Freq: Four times a day (QID) | RECTAL | Status: DC | PRN
  Administered 2021-06-14 – 2021-06-18 (×2): 650 mg via RECTAL
  Filled 2021-06-10 (×2): qty 1

## 2021-06-10 MED ORDER — DILTIAZEM HCL ER COATED BEADS 240 MG PO CP24: 240.0000 mg | ORAL_CAPSULE | Freq: Every day | ORAL | Status: DC

## 2021-06-10 MED ORDER — MAGNESIUM OXIDE -MG SUPPLEMENT 400 (240 MG) MG PO TABS
400.0000 mg | ORAL_TABLET | Freq: Every day | ORAL | Status: DC
  Administered 2021-06-10 – 2021-06-21 (×10): 400 mg via ORAL
  Filled 2021-06-10 (×11): qty 1

## 2021-06-10 MED ORDER — CYCLOSPORINE 0.05 % OP EMUL
1.0000 [drp] | Freq: Two times a day (BID) | OPHTHALMIC | Status: DC
  Administered 2021-06-10 – 2021-06-21 (×21): 1 [drp] via OPHTHALMIC
  Filled 2021-06-10 (×7): qty 30
  Filled 2021-06-10 (×2): qty 1
  Filled 2021-06-10 (×6): qty 30
  Filled 2021-06-10 (×3): qty 1
  Filled 2021-06-10 (×2): qty 30
  Filled 2021-06-10: qty 1
  Filled 2021-06-10: qty 30

## 2021-06-10 MED ORDER — INSULIN ASPART 100 UNIT/ML IJ SOLN: 0.0000 [IU] | Freq: Every day | INTRAMUSCULAR | Status: DC

## 2021-06-10 MED ORDER — SACCHAROMYCES BOULARDII 250 MG PO CAPS
250.0000 mg | ORAL_CAPSULE | Freq: Two times a day (BID) | ORAL | Status: DC
  Administered 2021-06-10 – 2021-06-21 (×21): 250 mg via ORAL
  Filled 2021-06-10 (×22): qty 1

## 2021-06-10 MED ORDER — CHLORHEXIDINE GLUCONATE CLOTH 2 % EX PADS
6.0000 | MEDICATED_PAD | Freq: Every day | CUTANEOUS | Status: DC
  Administered 2021-06-11 – 2021-06-18 (×8): 6 via TOPICAL

## 2021-06-10 MED ORDER — BUPROPION HCL ER (SR) 100 MG PO TB12
100.0000 mg | ORAL_TABLET | Freq: Two times a day (BID) | ORAL | Status: DC
  Administered 2021-06-10 – 2021-06-21 (×20): 100 mg via ORAL
  Filled 2021-06-10 (×24): qty 1

## 2021-06-10 MED ORDER — PROSOURCE PLUS PO LIQD
30.0000 mL | Freq: Every morning | ORAL | Status: DC
  Administered 2021-06-11 – 2021-06-19 (×5): 30 mL via ORAL
  Filled 2021-06-10 (×6): qty 30

## 2021-06-10 MED ORDER — OXYCODONE HCL 5 MG PO TABS
7.5000 mg | ORAL_TABLET | Freq: Three times a day (TID) | ORAL | Status: DC | PRN
  Administered 2021-06-10 – 2021-06-20 (×9): 7.5 mg via ORAL
  Filled 2021-06-10 (×10): qty 2

## 2021-06-10 MED ORDER — ICOSAPENT ETHYL 1 G PO CAPS
1.0000 g | ORAL_CAPSULE | Freq: Every morning | ORAL | Status: DC
  Administered 2021-06-11 – 2021-06-20 (×8): 1 g via ORAL
  Filled 2021-06-10 (×15): qty 1

## 2021-06-10 MED ORDER — DILTIAZEM LOAD VIA INFUSION
10.0000 mg | Freq: Once | INTRAVENOUS | Status: AC
  Administered 2021-06-10: 08:00:00 10 mg via INTRAVENOUS
  Filled 2021-06-10: qty 10

## 2021-06-10 MED ORDER — SODIUM CHLORIDE 0.9 % IV SOLN
250.0000 mL | INTRAVENOUS | Status: DC | PRN
  Administered 2021-06-18: 250 mL via INTRAVENOUS

## 2021-06-10 MED ORDER — SENNA 8.6 MG PO TABS
1.0000 | ORAL_TABLET | Freq: Every day | ORAL | Status: DC
  Administered 2021-06-10 – 2021-06-21 (×9): 8.6 mg via ORAL
  Filled 2021-06-10 (×10): qty 1

## 2021-06-10 MED ORDER — DICLOFENAC SODIUM 1 % EX GEL
2.0000 g | Freq: Every evening | CUTANEOUS | Status: DC | PRN
  Administered 2021-06-14: 2 g via TOPICAL
  Filled 2021-06-10: qty 100

## 2021-06-10 MED ORDER — PANTOPRAZOLE SODIUM 40 MG PO TBEC
40.0000 mg | DELAYED_RELEASE_TABLET | Freq: Every day | ORAL | Status: DC
Start: 2021-06-10 — End: 2021-06-21
  Administered 2021-06-10 – 2021-06-21 (×10): 40 mg via ORAL
  Filled 2021-06-10 (×11): qty 1

## 2021-06-10 MED ORDER — RIVAROXABAN 15 MG PO TABS: 15.0000 mg | ORAL_TABLET | Freq: Every day | ORAL | Status: DC

## 2021-06-10 MED ORDER — CARBOXYMETHYLCELLULOSE SODIUM 1 % OP SOLN: 2.0000 [drp] | Freq: Every morning | OPHTHALMIC | Status: DC

## 2021-06-10 MED ORDER — ALBUTEROL SULFATE (2.5 MG/3ML) 0.083% IN NEBU
2.5000 mg | INHALATION_SOLUTION | RESPIRATORY_TRACT | Status: DC | PRN
  Filled 2021-06-10: qty 3

## 2021-06-10 MED ORDER — PREDNISONE 10 MG PO TABS
5.0000 mg | ORAL_TABLET | Freq: Every day | ORAL | Status: DC
  Administered 2021-06-10 – 2021-06-21 (×10): 5 mg via ORAL
  Filled 2021-06-10 (×11): qty 1

## 2021-06-10 MED ORDER — METOPROLOL TARTRATE 5 MG/5ML IV SOLN
5.0000 mg | Freq: Four times a day (QID) | INTRAVENOUS | Status: DC | PRN
  Administered 2021-06-10 – 2021-06-18 (×4): 5 mg via INTRAVENOUS
  Filled 2021-06-10 (×5): qty 5

## 2021-06-10 MED ORDER — GABAPENTIN 300 MG PO CAPS
300.0000 mg | ORAL_CAPSULE | Freq: Three times a day (TID) | ORAL | Status: DC
  Administered 2021-06-10 – 2021-06-21 (×32): 300 mg via ORAL
  Filled 2021-06-10 (×33): qty 1

## 2021-06-10 MED ORDER — SODIUM CHLORIDE 0.9% FLUSH
3.0000 mL | Freq: Two times a day (BID) | INTRAVENOUS | Status: DC
  Administered 2021-06-10 – 2021-06-21 (×20): 3 mL via INTRAVENOUS

## 2021-06-10 MED ORDER — ZINC SULFATE 220 (50 ZN) MG PO CAPS
220.0000 mg | ORAL_CAPSULE | Freq: Every day | ORAL | Status: DC
  Administered 2021-06-10 – 2021-06-21 (×10): 220 mg via ORAL
  Filled 2021-06-10 (×11): qty 1

## 2021-06-10 MED ORDER — DILTIAZEM HCL-DEXTROSE 125-5 MG/125ML-% IV SOLN (PREMIX)
5.0000 mg/h | INTRAVENOUS | Status: DC
  Administered 2021-06-10 (×2): 15 mg/h via INTRAVENOUS
  Administered 2021-06-10: 08:00:00 5 mg/h via INTRAVENOUS
  Administered 2021-06-11: 15 mg/h via INTRAVENOUS
  Filled 2021-06-10 (×4): qty 125

## 2021-06-10 MED ORDER — PRESERVISION AREDS 2 PO CAPS: 1.0000 | ORAL_CAPSULE | Freq: Every day | ORAL | Status: DC

## 2021-06-10 MED ORDER — POLYVINYL ALCOHOL 1.4 % OP SOLN
2.0000 [drp] | Freq: Every day | OPHTHALMIC | Status: DC
  Administered 2021-06-10 – 2021-06-21 (×10): 2 [drp] via OPHTHALMIC
  Filled 2021-06-10 (×4): qty 15

## 2021-06-10 MED ORDER — ADULT MULTIVITAMIN W/MINERALS CH
1.0000 | ORAL_TABLET | Freq: Every day | ORAL | Status: DC
  Administered 2021-06-10 – 2021-06-21 (×10): 1 via ORAL
  Filled 2021-06-10 (×13): qty 1

## 2021-06-10 MED ORDER — OCUVITE-LUTEIN PO CAPS
1.0000 | ORAL_CAPSULE | Freq: Every day | ORAL | Status: DC
  Administered 2021-06-10 – 2021-06-21 (×10): 1 via ORAL
  Filled 2021-06-10 (×11): qty 1

## 2021-06-10 MED ORDER — CEFADROXIL 500 MG PO CAPS
1000.0000 mg | ORAL_CAPSULE | Freq: Two times a day (BID) | ORAL | Status: DC
  Administered 2021-06-10: 23:00:00 1000 mg via ORAL
  Filled 2021-06-10 (×2): qty 2

## 2021-06-10 NOTE — Assessment & Plan Note (Signed)
Replete and reevaluate along with magnesium

## 2021-06-10 NOTE — Assessment & Plan Note (Signed)
SSI while inpatient Hold statin for now and check CK levels given fall

## 2021-06-10 NOTE — ED Triage Notes (Signed)
Pt arrives EMS from [facility] after unwitnessed fall. Pt presents with knot to right forehead. Pt takes xarelto. ?

## 2021-06-10 NOTE — ED Notes (Signed)
EDP informed of pt's HR ?

## 2021-06-10 NOTE — ED Provider Notes (Signed)
[facility] EMERGENCY DEPARTMENT Provider Note   CSN: [id number] Arrival date & time: 06/10/21  4481     History  Chief Complaint  Patient presents with   [name] is a 76 y.o. female.  Patient was brought to the emergency department for evaluation after a fall at nursing home.  Fall was unwitnessed, patient found on ground and has contusion to the forehead.  Patient arrives in the ER without complaints.   Fall      Home Medications Prior to Admission medications   Medication Sig Start Date End Date Taking? Authorizing Provider  Abatacept (ORENCIA CLICKJECT) 856 MG/ML SOAJ Hold until follow up with PCP 05/25/21   Barton Dubois, MD  acetaminophen (TYLENOL) 650 MG CR tablet Take 1,300 mg by mouth in the morning and at bedtime.    [provider]  albuterol (VENTOLIN HFA) 108 (90 Base) MCG/ACT inhaler Inhale 2 puffs into the lungs every 4 (four) hours as needed for wheezing or shortness of breath.    [provider]  Amino Acids-Protein Hydrolys (FEEDING SUPPLEMENT, PRO-STAT 64,) LIQD Take 30 mLs by mouth in the morning.    [provider]  Ascorbic Acid (VITAMIN C PO) Take 500 mg by mouth in the morning and at bedtime.    [provider]  atorvastatin (LIPITOR) 20 MG tablet Take 20 mg by mouth daily. 01/27/20   [provider]  azelastine (OPTIVAR) 0.05 % ophthalmic solution Place 1 drop into both eyes 2 (two) times daily.    [provider]  buPROPion ER (WELLBUTRIN SR) 100 MG 12 hr tablet Take 1 tablet (100 mg total) by mouth 2 (two) times daily. 05/25/21   Barton Dubois, MD  Calcium Carbonate-Vit D-Min (CALCIUM 1200) 1200-1000 MG-UNIT CHEW 1 tablet with a meal    [provider]  carboxymethylcellulose (ARTIFICIAL TEARS) 1 % ophthalmic solution Place 2 drops into both eyes in the morning.    [provider]  cefadroxil (DURICEF) 500 MG capsule Take 2 capsules (1,000 mg total) by mouth 2 (two)  times daily for 16 days. 05/25/21 06/10/21  Barton Dubois, MD  cetirizine (ZYRTEC) 10 MG tablet 1 tablet    [provider]  Cream Base (REJUVACARE PLUS) CREA Apply 1 application topically in the morning and at bedtime. Apply to face for dry skin patches    [provider]  cycloSPORINE (RESTASIS) 0.05 % ophthalmic emulsion Place 1 drop into both eyes 2 (two) times daily.    [provider]  diclofenac Sodium (VOLTAREN) 1 % GEL Apply 2 g topically at bedtime as needed for pain. 03/15/20   [provider]  diltiazem (CARDIZEM CD) 240 MG 24 hr capsule Take 240 mg by mouth daily. 01/27/20   [provider]  Eyelid Cleansers (OCUSOFT LID SCRUB ORIGINAL) PADS Apply 1 application topically in the morning and at bedtime. Apply to both eyelids for eye problem    [provider]  fluticasone-salmeterol (ADVAIR HFA) 115-21 MCG/ACT inhaler Inhale 2 puffs into the lungs 2 times daily at 12 noon and 4 pm.    [provider]  furosemide (LASIX) 40 MG tablet Take 0.5 tablets (20 mg total) by mouth daily as needed for fluid or edema. 05/25/21   Barton Dubois, MD  gabapentin (NEURONTIN) 100 MG capsule Take 3 capsules (300 mg total) by mouth 3 (three) times daily. 10/10/17   Kathie Dike, MD  insulin glargine (LANTUS) 100 UNIT/ML injection Inject 30 Units into the  skin in the morning. Hold if BS is less than 150    [provider]  insulin lispro (HUMALOG) 100 UNIT/ML injection Inject 6 Units into the skin in the morning and at bedtime. Hold is BS <100    [provider]  leflunomide (ARAVA) 20 MG tablet Hold until follow up with PCP 05/25/21   Barton Dubois, MD  metFORMIN (GLUCOPHAGE-XR) 500 MG 24 hr tablet Take 1,000 mg by mouth 2 (two) times daily. 07/31/17   [provider]  metoprolol tartrate (LOPRESSOR) 100 MG tablet Take 1 tablet (100 mg total) by mouth 2 (two) times daily. 05/25/21   Barton Dubois, MD  Multiple  Vitamins-Minerals (PRESERVISION AREDS 2) CAPS Take 1 capsule by mouth daily.     [provider]  nitroGLYCERIN (NITROSTAT) 0.4 MG SL tablet Place 1 tablet (0.4 mg total) under the tongue every 5 (five) minutes as needed for chest pain (x 3 doses). 11/28/16   Lelon Perla, MD  omeprazole (PRILOSEC) 20 MG capsule Take 1 capsule by mouth daily. 02/12/18   [provider]  oxyCODONE (OXY IR/ROXICODONE) 5 MG immediate release tablet Take 1 tablet (5 mg total) by mouth every 8 (eight) hours as needed for severe pain. 05/25/21   Barton Dubois, MD  predniSONE (DELTASONE) 5 MG tablet Take 5 mg by mouth daily.    [provider]  Rivaroxaban (XARELTO) 15 MG TABS tablet Take 1 tablet (15 mg total) by mouth daily. Patient taking differently: Take 10 mg by mouth daily. 07/28/17   Kathrene Alu, MD  senna (SENOKOT) 8.6 MG TABS tablet Take 1 tablet by mouth daily.    [provider]  VASCEPA 1 g CAPS Take 1 capsule by mouth every morning. 02/25/18   [provider]  zinc sulfate 220 (50 Zn) MG capsule Take 220 mg by mouth daily.    [provider]  ferrous gluconate (FERGON) 324 MG tablet Take 1 tablet (324 mg total) by mouth 2 (two) times daily with a meal. Patient not taking: Reported on 03/30/2020 05/17/16 04/01/20  Debbe Odea, MD  potassium chloride SA (K-DUR,KLOR-CON) 20 MEQ tablet Take 1 tablet (20 mEq total) by mouth 2 (two) times daily. Patient not taking: Reported on 03/30/2020 10/10/17 04/01/20  Kathie Dike, MD      Allergies    Promethazine    Review of Systems   Review of Systems  Skin:  Negative for wound.   Physical Exam Updated Vital Signs BP (!) 164/117    Pulse (!) 152    Resp 20    Ht '5\' 7"'$  (1.702 m)    Wt 83.5 kg    SpO2 96%    BMI 28.83 kg/m  Physical Exam Vitals and nursing note reviewed.  Constitutional:      General: She is not in acute distress.    Appearance: She is well-developed.  HENT:     Head:  Normocephalic and atraumatic.     Mouth/Throat:     Mouth: Mucous membranes are moist.  Eyes:     General: Vision grossly intact. Gaze aligned appropriately.     Extraocular Movements: Extraocular movements intact.     Conjunctiva/sclera: Conjunctivae normal.  Cardiovascular:     Rate and Rhythm: Tachycardia present. Rhythm irregularly irregular.     Pulses: Normal pulses.     Heart sounds: Normal heart sounds, S1 normal and S2 normal. No murmur heard.   No friction rub. No gallop.  Pulmonary:     Effort: Pulmonary  effort is normal. No respiratory distress.     Breath sounds: Normal breath sounds.  Abdominal:     General: Bowel sounds are normal.     Palpations: Abdomen is soft.     Tenderness: There is no abdominal tenderness. There is no guarding or rebound.     Hernia: No hernia is present.  Musculoskeletal:        General: No swelling.     Cervical back: Full passive range of motion without pain, normal range of motion and neck supple. No spinous process tenderness or muscular tenderness. Normal range of motion.     Right lower leg: No edema.     Left lower leg: No edema.  Skin:    General: Skin is warm and dry.     Capillary Refill: Capillary refill takes less than 2 seconds.     Findings: Erythema (Left lower leg) and wound (First MTP joint) present. No ecchymosis or rash.  Neurological:     General: No focal deficit present.     Mental Status: She is alert and oriented to person, place, and time.     GCS: GCS eye subscore is 4. GCS verbal subscore is 5. GCS motor subscore is 6.     Cranial Nerves: Cranial nerves 2-12 are intact.     Sensory: Sensation is intact.     Motor: Motor function is intact.     Coordination: Coordination is intact.  Psychiatric:        Attention and Perception: Attention normal.        Mood and Affect: Mood normal.        Speech: Speech normal.        Behavior: Behavior normal.    ED Results / Procedures / Treatments   Labs (all labs  ordered are listed, but only abnormal results are displayed) Labs Reviewed  COMPREHENSIVE METABOLIC PANEL - Abnormal; Notable for the following components:      Result Value   Potassium 3.1 (*)    Glucose, Bld 100 (*)    Calcium 8.8 (*)    Albumin 3.0 (*)    Alkaline Phosphatase 148 (*)    All other components within normal limits  CBC WITH DIFFERENTIAL/PLATELET - Abnormal; Notable for the following components:   Hemoglobin 11.1 (*)    MCH 25.1 (*)    MCHC 28.8 (*)    RDW 19.6 (*)    All other components within normal limits  PROTIME-INR - Abnormal; Notable for the following components:   Prothrombin Time 15.7 (*)    INR 1.3 (*)    All other components within normal limits  BRAIN NATRIURETIC PEPTIDE - Abnormal; Notable for the following components:   B Natriuretic Peptide 644.0 (*)    All other components within normal limits  RESP PANEL BY RT-PCR (FLU A&B, COVID) ARPGX2  CULTURE, BLOOD (ROUTINE X 2)  CULTURE, BLOOD (ROUTINE X 2)  URINE CULTURE  LACTIC ACID, PLASMA  APTT  URINALYSIS, ROUTINE W REFLEX MICROSCOPIC  TROPONIN I (HIGH SENSITIVITY)    EKG EKG Interpretation  Date/Time:  Thursday June 10 2021 05:32:22 EST Ventricular Rate:  136 PR Interval:    QRS Duration: 74 QT Interval:  299 QTC Calculation: 450 R Axis:   76 Text Interpretation: Atrial fibrillation Repolarization abnormality, prob rate related Confirmed by Orpah Greek ([id number]) on 06/10/2021 5:57:33 AM  Radiology DG Chest Port 1 View  Result Date: 06/10/2021 CLINICAL DATA:  76 year old female with possible sepsis. Unwitnessed fall. EXAM: PORTABLE CHEST 1  VIEW COMPARISON:  Chest x-ray 05/21/2021. FINDINGS: Lung volumes are low. Diffuse interstitial prominence and widespread peribronchial cuffing again noted throughout the lungs bilaterally, similar to the prior study and numerous other prior examinations, suggesting chronic bronchitis. No confluent consolidative airspace disease. Small right pleural  effusion. No left pleural effusion. No pneumothorax. Mild cephalization of the pulmonary vasculature. Mild cardiomegaly. IMPRESSION: 1. The appearance the chest is very similar to prior studies. Findings again suggests probable chronic bronchitis, although a component of mild interstitial pulmonary edema is not excluded. Clinical correlation for signs and symptoms of congestive heart failure is recommended. 2. Persistent small right pleural effusion. Electronically Signed   By: Vinnie Langton M.D.   On: 06/10/2021 06:26    Procedures Procedures    Medications Ordered in ED Medications  diltiazem (CARDIZEM) 1 mg/mL load via infusion 10 mg (has no administration in time range)    And  diltiazem (CARDIZEM) 125 mg in dextrose 5% 125 mL (1 mg/mL) infusion (has no administration in time range)    ED Course/ Medical Decision Making/ A&P                           Medical Decision Making Amount and/or Complexity of Data Reviewed Labs: ordered. Radiology: ordered. ECG/medicine tests: ordered.  Risk Prescription drug management.   Patient sent to the ER from nursing home for evaluation after unwitnessed fall.  Patient is on Xarelto secondary to atrial fibrillation.  Patient was sent here to rule out injury from the fall.  She is asymptomatic on arrival.  She did have a head CT performed because of Xarelto use.  Patient was, however, found to be in atrial fibrillation with rapid ventricular response, heart rate above 150 on arrival.  She is very hypertensive as well.  Patient's lower legs are wrapped.  There is chronic wound on her foot and she has associated erythema and likely cellulitis.  Reviewing previous records reveals history of sepsis.  She was hospitalized in February and ultimately found to have iliopsoas abscess.  Patient does not have a leukocytosis and lactic acid is normal.  Rapid A-fib likely secondary to her known atrial fibrillation.  Initiate Cardizem drip, she does utilize  Lopressor and Cardizem as an outpatient for her rate control.  Will sign out to oncoming ER physician.  CRITICAL CARE Performed by: Orpah Greek   Total critical care time: 30 minutes  Critical care time was exclusive of separately billable procedures and treating other patients.  Critical care was necessary to treat or prevent imminent or life-threatening deterioration.  Critical care was time spent personally by me on the following activities: development of treatment plan with patient and/or surrogate as well as nursing, discussions with consultants, evaluation of patient's response to treatment, examination of patient, obtaining history from patient or surrogate, ordering and performing treatments and interventions, ordering and review of laboratory studies, ordering and review of radiographic studies, pulse oximetry and re-evaluation of patient's condition.         Final Clinical Impression(s) / ED Diagnoses Final diagnoses:  Cellulitis of left lower extremity  Atrial fibrillation with RVR ([place])  Primary hypertension  Contusion of scalp, initial encounter    Rx / DC Orders ED Discharge Orders     None         Tracy Gerken, Gwenyth Allegra, MD 06/10/21 [phone]

## 2021-06-10 NOTE — Assessment & Plan Note (Addendum)
Antibiotics is completed for this.

## 2021-06-10 NOTE — H&P (Signed)
History and Physical    Patient: [name] QHU:[id number] DOB: July 25, 1945 DOA: 06/10/2021 DOS: the patient was seen and examined on 06/10/2021 PCP: Aletha Halim., PA-C  Patient coming from: SNF  Chief Complaint:  Chief Complaint  Patient presents with   Fall   HPI: [name] is a 76 y.o. female with medical history significant of paroxysmal atrial fibrillation on Xarelto for anticoagulation, chronic diastolic heart failure, dyslipidemia, hypertension, type 2 diabetes, prior CVA, peripheral vascular disease, rheumatoid arthritis, and chronic lower extremity wounds who presented to the ED after a fall at her SNF that was unwitnessed.  Upon arrival to the ED, she was found to be in atrial fibrillation with RVR and was also noted to be quite hypertensive.  She denies any chest pain, palpitations, or shortness of breath and complains of her left lower extremity erythema.  She appears to have been seen by wound care on 3/8 with debridements done at that time with no concern for cellulitis.  She was recently hospitalized for iliopsoas abscess that was drained and discharged on 2/21.  She has been completing her antibiotics at SNF.  In the ED, CT head was performed with no acute findings and she was noted to have atrial fibrillation with RVR with heart rate in the 150 bpm range.  She has been started on Cardizem drip for heart rate control.  Review of Systems: As mentioned in the history of present illness. All other systems reviewed and are negative. Past Medical History:  Diagnosis Date   Abnormal LFTs 07/24/2017   Actinic keratosis    Anemia    hx of   Anemia of chronic renal failure, stage 3 (moderate) ([place]) 07/17/2015   Anticoagulant causing adverse effect in therapeutic use 07/17/2015   Anxiety    Arthritis    rheumatoid   Atrial fibrillation ([place])    a. persistent, she remains on Xarelto   Carpal tunnel syndrome    Chronic diastolic (congestive) heart failure ([place])    a.  04/2016: echo showing a preserved EF of 55-60% with mild MR. LA and RA midly dilated.    Edema    left leg at ankle resolved now   Gastroesophageal reflux disease    Hepatitis 1970   not sure what kind   Hiatal hernia    Hyperlipidemia    Hypertension    Iron deficiency anemia due to chronic blood loss 07/17/2015   Jaundice    age 55   Peripheral vascular disease ([place]) yrs ago   DVT left lower leg questionale told by 2 drs i had no clot, 1 md said i did   Rheumatoid arthritis(714.0)    Right knee pain    Stroke ([place])    Type II diabetes mellitus ([place])    type2   Past Surgical History:  Procedure Laterality Date   CATARACT EXTRACTION Bilateral    COLONOSCOPY WITH PROPOFOL N/A 02/20/2015   Procedure: COLONOSCOPY WITH PROPOFOL;  Surgeon: Carol Ada, MD;  Location: [facility];  Service: Endoscopy;  Laterality: N/A;   ENTEROSCOPY N/A 03/23/2016   Procedure: ENTEROSCOPY;  Surgeon: Carol Ada, MD;  Location: [facility];  Service: Endoscopy;  Laterality: N/A;   ENTEROSCOPY N/A 06/17/2016   Procedure: ENTEROSCOPY;  Surgeon: Carol Ada, MD;  Location: [facility];  Service: Endoscopy;  Laterality: N/A;   ESOPHAGOGASTRODUODENOSCOPY (EGD) WITH PROPOFOL N/A 02/20/2015   Procedure: ESOPHAGOGASTRODUODENOSCOPY (EGD) WITH PROPOFOL;  Surgeon: Carol Ada, MD;  Location: [facility];  Service: Endoscopy;  Laterality: N/A;  GIVENS CAPSULE STUDY N/A 03/21/2016   Procedure: GIVENS CAPSULE STUDY;  Surgeon: Carol Ada, MD;  Location: [facility];  Service: Endoscopy;  Laterality: N/A;   HAND SURGERY  1995 and 1996   artificial joints both hands   JOINT REPLACEMENT     LUMBAR LAMINECTOMY/DECOMPRESSION MICRODISCECTOMY N/A 02/10/2016   Procedure: MICROLUMBAR DECOMPRESSION L4-L5 AND L3- L4, AND EXCISION OF SYNOVIAL CYST L4-L5;  Surgeon: Susa Day, MD;  Location: [facility];  Service: Orthopedics;  Laterality: N/A;   TOTAL HIP ARTHROPLASTY Left 2009   TOTAL HIP ARTHROPLASTY Right 04/03/2013    Procedure: RIGHT TOTAL HIP ARTHROPLASTY ANTERIOR APPROACH;  Surgeon: Gearlean Alf, MD;  Location: [facility];  Service: Orthopedics;  Laterality: Right;   Social History:  reports that she has never smoked. She has never used smokeless tobacco. She reports that she does not drink alcohol and does not use drugs.  Allergies  Allergen Reactions   Promethazine     Family History  Problem Relation Age of Onset   Pneumonia Mother 52   Stroke Father 21   Heart failure Sister    Hypertension Sister    Heart attack Neg Hx     Prior to Admission medications   Medication Sig Start Date End Date Taking? Authorizing Provider  acetaminophen (TYLENOL) 650 MG CR tablet Take 1,300 mg by mouth in the morning and at bedtime.   Yes [provider]  albuterol (VENTOLIN HFA) 108 (90 Base) MCG/ACT inhaler Inhale 2 puffs into the lungs every 4 (four) hours as needed for wheezing or shortness of breath.   Yes [provider]  Amino Acids-Protein Hydrolys (FEEDING SUPPLEMENT, PRO-STAT 64,) LIQD Take 30 mLs by mouth in the morning.   Yes [provider]  Ascorbic Acid (VITAMIN C PO) Take 500 mg by mouth in the morning and at bedtime.   Yes [provider]  atorvastatin (LIPITOR) 20 MG tablet Take 20 mg by mouth daily. 01/27/20  Yes [provider]  azelastine (OPTIVAR) 0.05 % ophthalmic solution Place 1 drop into both eyes 2 (two) times daily.   Yes [provider]  buPROPion ER (WELLBUTRIN SR) 100 MG 12 hr tablet Take 1 tablet (100 mg total) by mouth 2 (two) times daily. 05/25/21  Yes Barton Dubois, MD  Calcium Carbonate-Vit D-Min (CALCIUM 1200) 1200-1000 MG-UNIT CHEW 1 tablet with a meal   Yes [provider]  carboxymethylcellulose (ARTIFICIAL TEARS) 1 % ophthalmic solution Place 2 drops into both eyes in the morning.   Yes [provider]  cefadroxil (DURICEF) 500 MG capsule Take 2 capsules (1,000 mg total) by mouth 2 (two) times daily for  16 days. 05/25/21 06/10/21 Yes Barton Dubois, MD  cetirizine (ZYRTEC) 10 MG tablet 1 tablet   Yes [provider]  cycloSPORINE (RESTASIS) 0.05 % ophthalmic emulsion Place 1 drop into both eyes 2 (two) times daily.   Yes [provider]  diclofenac Sodium (VOLTAREN) 1 % GEL Apply 2 g topically at bedtime as needed for pain. 03/15/20  Yes [provider]  diltiazem (CARDIZEM CD) 240 MG 24 hr capsule Take 240 mg by mouth daily. 01/27/20  Yes [provider]  Eyelid Cleansers (OCUSOFT LID SCRUB ORIGINAL) PADS Apply 1 application topically in the morning and at bedtime. Apply to both eyelids for eye problem   Yes [provider]  fluticasone-salmeterol (ADVAIR HFA) 115-21 MCG/ACT inhaler Inhale 2 puffs into the lungs 2 times daily at 12 noon and 4 pm.   Yes [provider]  furosemide (LASIX) 40 MG tablet Take 0.5 tablets (20 mg total) by mouth daily as needed for fluid or edema. 05/25/21  Yes Barton Dubois, MD  gabapentin (NEURONTIN) 100 MG capsule Take 3 capsules (300 mg total) by mouth 3 (three) times daily. Patient taking differently: Take 400 mg by mouth 2 (two) times daily. 10/10/17  Yes Kathie Dike, MD  insulin glargine (LANTUS) 100 UNIT/ML injection Inject 30 Units into the skin in the morning. Hold if BS is less than 150   Yes [provider]  insulin lispro (HUMALOG) 100 UNIT/ML injection Inject 6 Units into the skin in the morning and at bedtime. Hold is BS <100   Yes [provider]  magnesium oxide (MAG-OX) 400 MG tablet Take 400 mg by mouth daily.   Yes [provider]  metFORMIN (GLUCOPHAGE-XR) 500 MG 24 hr tablet Take 1,000 mg by mouth 2 (two) times daily. 07/31/17  Yes [provider]  metoprolol tartrate (LOPRESSOR) 100 MG tablet Take 1 tablet (100 mg total) by mouth 2 (two) times daily. 05/25/21  Yes Barton Dubois, MD  Multiple Vitamin (MULTIVITAMIN) tablet Take 1 tablet by mouth daily.   Yes  [provider]  Multiple Vitamins-Minerals (PRESERVISION AREDS 2) CAPS Take 1 capsule by mouth daily.    Yes [provider]  nitroGLYCERIN (NITROSTAT) 0.4 MG SL tablet Place 1 tablet (0.4 mg total) under the tongue every 5 (five) minutes as needed for chest pain (x 3 doses). 11/28/16  Yes Lelon Perla, MD  omeprazole (PRILOSEC) 20 MG capsule Take 1 capsule by mouth daily. 02/12/18  Yes [provider]  oxyCODONE (OXY IR/ROXICODONE) 5 MG immediate release tablet Take 1 tablet (5 mg total) by mouth every 8 (eight) hours as needed for severe pain. Patient taking differently: Take 7.5 mg by mouth every 8 (eight) hours as needed for severe pain. 05/25/21  Yes Barton Dubois, MD  polyethylene glycol (MIRALAX / GLYCOLAX) 17 g packet Take 17 g by mouth 2 (two) times daily as needed for mild constipation.   Yes [provider]  predniSONE (DELTASONE) 5 MG tablet Take 5 mg by mouth daily.   Yes [provider]  Rivaroxaban (XARELTO) 15 MG TABS tablet Take 1 tablet (15 mg total) by mouth daily. Patient taking differently: Take 10 mg by mouth daily. 07/28/17  Yes Winfrey, Alcario Drought, MD  saccharomyces boulardii (FLORASTOR) 250 MG capsule Take 250 mg by mouth 2 (two) times daily.   Yes [provider]  senna (SENOKOT) 8.6 MG TABS tablet Take 1 tablet by mouth daily.   Yes [provider]  VASCEPA 1 g CAPS Take 1 capsule by mouth every morning. 02/25/18  Yes [provider]  zinc sulfate 220 (50 Zn) MG capsule Take 220 mg by mouth daily.   Yes [provider]  ferrous gluconate (FERGON) 324 MG tablet Take 1 tablet (324 mg total) by mouth 2 (two) times daily with a meal. Patient not taking: Reported on 03/30/2020 05/17/16 04/01/20  Debbe Odea, MD  potassium chloride SA (K-DUR,KLOR-CON) 20 MEQ tablet Take 1 tablet (20 mEq total) by mouth 2 (two) times daily. Patient not taking: Reported on 03/30/2020 10/10/17 04/01/20  Kathie Dike, MD    Physical Exam: Vitals:   06/10/21 0830 06/10/21 0900 06/10/21 0930 06/10/21 0946  BP: (!) 155/122 (!) 146/99 135/89 135/89  Pulse:  (!) 142 (!) 129 76  Resp: 17 (!) 24 (!) 23 19  Temp:  TempSrc:      SpO2:  100% 100% 93%  Weight:      Height:       General exam: Alert, awake, following commands appropriately and expressing overall improvement in her left flank/hip area pain.  No fever, no nausea, no vomiting.  Denies chest pain or palpitations. Respiratory system: Good air movement bilaterally; no using accessory muscles.  Good saturation on room air. Cardiovascular system: Rate controlled, no rubs, no gallops, no JVD on exam. Gastrointestinal system: Abdomen is nondistended, soft and nontender. No organomegaly or masses felt. Normal bowel sounds heard. Central nervous system: No focal neurological deficits. Extremities: No cyanosis or clubbing; no edema appreciated on exam. Skin: No petechiae.  Present at time of admission left foot ulcers at the base of her first toe and dorsal third toe; no significant drainage noted, there is mild erythema. Psychiatry: Mood & affect appropriate.  Patient is following commands appropriately.  Data Reviewed:  There are no new results to review at this time.  Assessment and Plan: * Atrial fibrillation with RVR (HCC) Continue on Cardizem drip as well as home oral dose of Cardizem Check TSH Recent 2D echocardiogram noted on 2/23 and will avoid for now IV metoprolol as needed for elevated heart rates Likely will require increasing doses of Cardizem upon discharge Continue anticoagulation with Xarelto  Hypokalemia Replete and reevaluate along with magnesium  Iliopsoas abscess on left [facility]) Patient finishing up [place] today  Chronic diastolic (congestive) heart failure ([place]) Appears euvolemic for now, hold Lasix 2D echocardiogram recently performed  Peripheral vascular disease ([place]) Noted to have wounds on lower  extremities which is being followed by outpatient wound care 3/8  Rheumatoid arthritis ([place]) Continue prednisone  Type 2 diabetes mellitus with hyperlipidemia (HCC) SSI while inpatient Hold statin for now and check CK levels given fall  Hypertension Noted to have elevated blood pressures on admission, monitor      Advance Care Planning: DNR  Consults: None  Family Communication: Discussed with son on phone 3/9  Severity of Illness: The appropriate patient status for this patient is INPATIENT. Inpatient status is judged to be reasonable and necessary in order to provide the required intensity of service to ensure the patient's safety. The patient's presenting symptoms, physical exam findings, and initial radiographic and laboratory data in the context of their chronic comorbidities is felt to place them at high risk for further clinical deterioration. Furthermore, it is not anticipated that the patient will be medically stable for discharge from the hospital within 2 midnights of admission.   * I certify that at the point of admission it is my clinical judgment that the patient will require inpatient hospital care spanning beyond 2 midnights from the point of admission due to high intensity of service, high risk for further deterioration and high frequency of surveillance required.*  Author: Rodena Goldmann, DO 06/10/2021 10:28 AM  For on call review [email or web address].

## 2021-06-10 NOTE — ED Provider Notes (Signed)
?  Physical Exam  ?BP 137/79 (BP Location: Left Arm)   Pulse (!) 157   Temp 98 ?F (36.7 ?C) (Oral)   Resp 20   Ht '5\' 7"'$  (1.702 m)   Wt 83.5 kg   SpO2 98%   BMI 28.83 kg/m?  ? ?Physical Exam ? ?Procedures  ?Procedures ? ?ED Course / MDM  ?  ?Medical Decision Making ?Amount and/or Complexity of Data Reviewed ?Labs: ordered. ?Radiology: ordered. ?ECG/medicine tests: ordered. ? ?Risk ?Prescription drug management. ? ? ?Received patient in signout.  Fibrillation with RVR.  Had a fall.  Is already on anticoagulation.  Has on a Cardizem drip.  Recent admission to the hospital of sepsis after iliopsoas abscess.  However no white count and normal lactic acid.  Sepsis felt less likely.  Does have potential cellulitis of the left lower leg.  Antibiotics been given.  Will discuss with hospitalist for admission. ? ? ? ? ?  ?Davonna Belling, MD ?06/10/21 0845 ? ?

## 2021-06-10 NOTE — ED Notes (Signed)
Gave pt meal ?

## 2021-06-10 NOTE — Assessment & Plan Note (Signed)
Noted to have wounds on lower extremities which is being followed by outpatient wound care 3/8

## 2021-06-10 NOTE — Assessment & Plan Note (Addendum)
Cardizem drip weaned off, but continues to have elevated heart rates at rest.  Increase Cardizem to 90 mg every 6 hours. TSH within normal limits Recent 2D echocardiogram noted on 2/23 and will avoid for now IV metoprolol as needed for elevated heart rates Likely will require increasing doses of Cardizem upon discharge Continue anticoagulation with Xarelto

## 2021-06-10 NOTE — ED Notes (Signed)
Help reposition pt and put a pillow  behind her back @ pt request  ?

## 2021-06-10 NOTE — Assessment & Plan Note (Addendum)
Holding Lasix for now given electrolyte abnormalities Anticipate 1 dose of IV Lasix in a.m. once abnormalities have been corrected 2D echocardiogram recently performed

## 2021-06-10 NOTE — ED Notes (Signed)
Pt placed on purewick 

## 2021-06-10 NOTE — ED Notes (Signed)
Pts purewick still in place  ?

## 2021-06-10 NOTE — Assessment & Plan Note (Signed)
-

## 2021-06-10 NOTE — Assessment & Plan Note (Addendum)
Noted to have elevated blood pressures on admission, monitor

## 2021-06-11 DIAGNOSIS — E876 Hypokalemia: Secondary | ICD-10-CM | POA: Diagnosis not present

## 2021-06-11 DIAGNOSIS — I10 Essential (primary) hypertension: Secondary | ICD-10-CM | POA: Diagnosis not present

## 2021-06-11 LAB — BASIC METABOLIC PANEL
Anion gap: 8 (ref 5–15)
BUN: 10 mg/dL (ref 8–23)
CO2: 28 mmol/L (ref 22–32)
Calcium: 8 mg/dL — ABNORMAL LOW (ref 8.9–10.3)
Chloride: 106 mmol/L (ref 98–111)
Creatinine, Ser: 0.68 mg/dL (ref 0.44–1.00)
GFR, Estimated: >60 mL/min (ref >60)
Glucose, Bld: 86 mg/dL (ref 70–99)
Potassium: 2.9 mmol/L — ABNORMAL LOW (ref 3.5–5.1)
Sodium: 142 mmol/L (ref 135–145)

## 2021-06-11 LAB — GLUCOSE, CAPILLARY
Glucose-Capillary: 166 mg/dL — ABNORMAL HIGH (ref 70–99)
Glucose-Capillary: 126 mg/dL — ABNORMAL HIGH (ref 70–99)
Glucose-Capillary: 138 mg/dL — ABNORMAL HIGH (ref 70–99)
Glucose-Capillary: 89 mg/dL (ref 70–99)

## 2021-06-11 LAB — CBC
HCT: 31.7 % — ABNORMAL LOW (ref 36.0–46.0)
Hemoglobin: 9.3 g/dL — ABNORMAL LOW (ref 12.0–15.0)
MCH: 25.3 pg — ABNORMAL LOW (ref 26.0–34.0)
MCHC: 29.3 g/dL — ABNORMAL LOW (ref 30.0–36.0)
MCV: 86.4 fL (ref 80.0–100.0)
Platelets: 269 10*3/uL (ref 150–400)
RBC: 3.67 MIL/uL — ABNORMAL LOW (ref 3.87–5.11)
RDW: 19.8 % — ABNORMAL HIGH (ref 11.5–15.5)
WBC: 7.2 10*3/uL (ref 4.0–10.5)
nRBC: 0 % (ref 0.0–0.2)

## 2021-06-11 LAB — MAGNESIUM: Magnesium: 1.3 mg/dL — ABNORMAL LOW (ref 1.7–2.4)

## 2021-06-11 MED ORDER — DILTIAZEM HCL 60 MG PO TABS
60.0000 mg | ORAL_TABLET | Freq: Four times a day (QID) | ORAL | Status: DC
  Administered 2021-06-11 – 2021-06-12 (×4): 60 mg via ORAL
  Filled 2021-06-11 (×4): qty 1

## 2021-06-11 MED ORDER — MAGNESIUM SULFATE 2 GM/50ML IV SOLN
2.0000 g | Freq: Once | INTRAVENOUS | Status: AC
  Administered 2021-06-11: 2 g via INTRAVENOUS
  Filled 2021-06-11: qty 50

## 2021-06-11 MED ORDER — HALOPERIDOL LACTATE 5 MG/ML IJ SOLN
2.0000 mg | Freq: Once | INTRAMUSCULAR | Status: AC
  Administered 2021-06-11: 2 mg via INTRAMUSCULAR
  Filled 2021-06-11: qty 1

## 2021-06-11 MED ORDER — POTASSIUM CHLORIDE CRYS ER 20 MEQ PO TBCR
40.0000 meq | EXTENDED_RELEASE_TABLET | Freq: Once | ORAL | Status: AC
  Administered 2021-06-11: 40 meq via ORAL
  Filled 2021-06-11: qty 2

## 2021-06-11 MED ORDER — POTASSIUM CHLORIDE 10 MEQ/100ML IV SOLN
10.0000 meq | INTRAVENOUS | Status: AC
  Administered 2021-06-11 (×4): 10 meq via INTRAVENOUS
  Filled 2021-06-11 (×4): qty 100

## 2021-06-11 NOTE — Consult Note (Addendum)
Cardiology Consultation:   Patient ID: [name] MRN: [id number]; DOB: January 20, 1946  Admit date: 06/10/2021 Date of Consult: 06/11/2021  PCP:  [name]., PA-C   [facility] Providers Cardiologist:  Kirk Ruths, MD        Patient Profile:   [name] is a 76 y.o. female with a hx of permanent atrial fibrillation, history of GIB secondary to AVM's, HFpEF, HTN, HLD, Stage 3 CKD and prior CVA who is being seen 06/11/2021 for the evaluation of atrial fibrillation with RVR at the request of Dr. Manuella Ghazi.  History of Present Illness:   [name] was last evaluated by Cardiology during an admission in 03/2020 for atrial fibrillation with RVR as she was admitted for sepsis due to Group B Streptococcus. Was discharged to SNF and cardiac medications at that time included Atorvastatin '20mg'$  daily, Cardizem CD '240mg'$  daily, Lasix '40mg'$  daily, Lopressor '100mg'$  TID, Spironolactone 12.'5mg'$  daily and Xarelto '15mg'$  daily.   Was admitted to [facility] in 05/2021 for sepsis in the setting of iliopsoas abscess which was treated with I&D and with IV Rocephin. Was discharged on Cardizem CD '240mg'$  daily, Lopressor '100mg'$  BID and Xarelto for her atrial fibrillation.  She presented back to the ED during the early morning hours of 06/10/2021 after suffering a fall. Initial labs showed WBC 8.1, Hgb 11.1, platelets 298, Na+ 144, K+ 3.1 and creatinine 0.64. BNP 644. TSH 2.266. Initial and repeat Hs Troponin negative at 10 and 9. CXR suggested probably chronic bronchitis but edema not excluded. Was noted to have a small right pleural effusion. CT Head showing a right forehead scalp hematoma with no acute intracranial abnormalities. EKG showed atrial fibrillation with RVR, HR 136.  She was admitted for further management of her atrial fibrillation and started on IV Cardizem for rate-control. Was scheduled to receive Cardizem CD 240 mg daily yesterday but not administered. Received one dose of PO Lopressor 100 mg at  2200 but has not yet received this AM. Labs this AM showed her K+ was low at 2.9 and PO and IV supplementation have been ordered.   In talking with the patient today, she reports she fell on the day of admission due to slipping as she thought she had no-grip socks on but she did have TED hose on. She denies any dizziness or syncope. No recent chest pain or palpitations. Overall asymptomatic with her atrial fibrillation. HR has been in the 80's to 90's overnight and currently on IV Cardizem 15 mg/hr (BP soft at 97/81 on most recent check).   Past Medical History:  Diagnosis Date   Abnormal LFTs 07/24/2017   Actinic keratosis    Anemia    hx of   Anemia of chronic renal failure, stage 3 (moderate) ([place]) 07/17/2015   Anticoagulant causing adverse effect in therapeutic use 07/17/2015   Anxiety    Arthritis    rheumatoid   Atrial fibrillation ([place])    a. persistent, she remains on Xarelto   Carpal tunnel syndrome    Chronic diastolic (congestive) heart failure ([place])    a. 04/2016: echo showing a preserved EF of 55-60% with mild MR. LA and RA midly dilated.    Edema    left leg at ankle resolved now   Gastroesophageal reflux disease    Hepatitis 1970   not sure what kind   Hiatal hernia    Hyperlipidemia    Hypertension    Iron deficiency anemia due to chronic blood loss 07/17/2015   Jaundice  age 28   Peripheral vascular disease ([place]) yrs ago   DVT left lower leg questionale told by 2 drs i had no clot, 1 md said i did   Rheumatoid arthritis(714.0)    Right knee pain    Stroke ([place])    Type II diabetes mellitus ([place])    type2    Past Surgical History:  Procedure Laterality Date   CATARACT EXTRACTION Bilateral    COLONOSCOPY WITH PROPOFOL N/A 02/20/2015   Procedure: COLONOSCOPY WITH PROPOFOL;  Surgeon: Carol Ada, MD;  Location: [facility];  Service: Endoscopy;  Laterality: N/A;   ENTEROSCOPY N/A 03/23/2016   Procedure: ENTEROSCOPY;  Surgeon: Carol Ada, MD;  Location: [facility]  ENDOSCOPY;  Service: Endoscopy;  Laterality: N/A;   ENTEROSCOPY N/A 06/17/2016   Procedure: ENTEROSCOPY;  Surgeon: Carol Ada, MD;  Location: [facility];  Service: Endoscopy;  Laterality: N/A;   ESOPHAGOGASTRODUODENOSCOPY (EGD) WITH PROPOFOL N/A 02/20/2015   Procedure: ESOPHAGOGASTRODUODENOSCOPY (EGD) WITH PROPOFOL;  Surgeon: Carol Ada, MD;  Location: [facility];  Service: Endoscopy;  Laterality: N/A;   GIVENS CAPSULE STUDY N/A 03/21/2016   Procedure: GIVENS CAPSULE STUDY;  Surgeon: Carol Ada, MD;  Location: [facility];  Service: Endoscopy;  Laterality: N/A;   HAND SURGERY  1995 and 1996   artificial joints both hands   JOINT REPLACEMENT     LUMBAR LAMINECTOMY/DECOMPRESSION MICRODISCECTOMY N/A 02/10/2016   Procedure: MICROLUMBAR DECOMPRESSION L4-L5 AND L3- L4, AND EXCISION OF SYNOVIAL CYST L4-L5;  Surgeon: Susa Day, MD;  Location: [facility];  Service: Orthopedics;  Laterality: N/A;   TOTAL HIP ARTHROPLASTY Left 2009   TOTAL HIP ARTHROPLASTY Right 04/03/2013   Procedure: RIGHT TOTAL HIP ARTHROPLASTY ANTERIOR APPROACH;  Surgeon: Gearlean Alf, MD;  Location: [facility];  Service: Orthopedics;  Laterality: Right;     Home Medications:  Prior to Admission medications   Medication Sig Start Date End Date Taking? Authorizing Provider  acetaminophen (TYLENOL) 650 MG CR tablet Take 1,300 mg by mouth in the morning and at bedtime.   Yes [provider]  albuterol (VENTOLIN HFA) 108 (90 Base) MCG/ACT inhaler Inhale 2 puffs into the lungs every 4 (four) hours as needed for wheezing or shortness of breath.   Yes [provider]  Amino Acids-Protein Hydrolys (FEEDING SUPPLEMENT, PRO-STAT 64,) LIQD Take 30 mLs by mouth in the morning.   Yes [provider]  Ascorbic Acid (VITAMIN C PO) Take 500 mg by mouth in the morning and at bedtime.   Yes [provider]  atorvastatin (LIPITOR) 20 MG tablet Take 20 mg by mouth daily. 01/27/20  Yes [provider]   azelastine (OPTIVAR) 0.05 % ophthalmic solution Place 1 drop into both eyes 2 (two) times daily.   Yes [provider]  buPROPion ER (WELLBUTRIN SR) 100 MG 12 hr tablet Take 1 tablet (100 mg total) by mouth 2 (two) times daily. 05/25/21  Yes Barton Dubois, MD  Calcium Carbonate-Vit D-Min (CALCIUM 1200) 1200-1000 MG-UNIT CHEW 1 tablet with a meal   Yes [provider]  carboxymethylcellulose (ARTIFICIAL TEARS) 1 % ophthalmic solution Place 2 drops into both eyes in the morning.   Yes [provider]  cetirizine (ZYRTEC) 10 MG tablet 1 tablet   Yes [provider]  cycloSPORINE (RESTASIS) 0.05 % ophthalmic emulsion Place 1 drop into both eyes 2 (two) times daily.   Yes [provider]  diclofenac Sodium (VOLTAREN) 1 % GEL Apply 2 g topically at bedtime as needed for pain. 03/15/20  Yes [provider]  diltiazem (CARDIZEM CD) 240 MG 24 hr capsule Take 240 mg by mouth daily. 01/27/20  Yes [provider]  Eyelid Cleansers (OCUSOFT LID SCRUB ORIGINAL) PADS Apply 1 application topically in the morning and at bedtime. Apply to both eyelids for eye problem   Yes [provider]  fluticasone-salmeterol (ADVAIR HFA) 115-21 MCG/ACT inhaler Inhale 2 puffs into the lungs 2 times daily at 12 noon and 4 pm.   Yes [provider]  furosemide (LASIX) 40 MG tablet Take 0.5 tablets (20 mg total) by mouth daily as needed for fluid or edema. 05/25/21  Yes Barton Dubois, MD  gabapentin (NEURONTIN) 100 MG capsule Take 3 capsules (300 mg total) by mouth 3 (three) times daily. Patient taking differently: Take 400 mg by mouth 2 (two) times daily. 10/10/17  Yes Kathie Dike, MD  insulin glargine (LANTUS) 100 UNIT/ML injection Inject 30 Units into the skin in the morning. Hold if BS is less than 150   Yes [provider]  insulin lispro (HUMALOG) 100 UNIT/ML injection Inject 6 Units into the skin in the morning and at bedtime. Hold is BS  <100   Yes [provider]  magnesium oxide (MAG-OX) 400 MG tablet Take 400 mg by mouth daily.   Yes [provider]  metFORMIN (GLUCOPHAGE-XR) 500 MG 24 hr tablet Take 1,000 mg by mouth 2 (two) times daily. 07/31/17  Yes [provider]  metoprolol tartrate (LOPRESSOR) 100 MG tablet Take 1 tablet (100 mg total) by mouth 2 (two) times daily. 05/25/21  Yes Barton Dubois, MD  Multiple Vitamin (MULTIVITAMIN) tablet Take 1 tablet by mouth daily.   Yes [provider]  Multiple Vitamins-Minerals (PRESERVISION AREDS 2) CAPS Take 1 capsule by mouth daily.    Yes [provider]  nitroGLYCERIN (NITROSTAT) 0.4 MG SL tablet Place 1 tablet (0.4 mg total) under the tongue every 5 (five) minutes as needed for chest pain (x 3 doses). 11/28/16  Yes Lelon Perla, MD  omeprazole (PRILOSEC) 20 MG capsule Take 1 capsule by mouth daily. 02/12/18  Yes [provider]  oxyCODONE (OXY IR/ROXICODONE) 5 MG immediate release tablet Take 1 tablet (5 mg total) by mouth every 8 (eight) hours as needed for severe pain. Patient taking differently: Take 7.5 mg by mouth every 8 (eight) hours as needed for severe pain. 05/25/21  Yes Barton Dubois, MD  polyethylene glycol (MIRALAX / GLYCOLAX) 17 g packet Take 17 g by mouth 2 (two) times daily as needed for mild constipation.   Yes [provider]  predniSONE (DELTASONE) 5 MG tablet Take 5 mg by mouth daily.   Yes [provider]  Rivaroxaban (XARELTO) 15 MG TABS tablet Take 1 tablet (15 mg total) by mouth daily. Patient taking differently: Take 10 mg by mouth daily. 07/28/17  Yes Winfrey, Alcario Drought, MD  saccharomyces boulardii (FLORASTOR) 250 MG capsule Take 250 mg by mouth 2 (two) times daily.   Yes [provider]  senna (SENOKOT) 8.6 MG TABS tablet Take 1 tablet by mouth daily.   Yes [provider]  VASCEPA 1 g CAPS Take 1 capsule by mouth every morning. 02/25/18  Yes [provider]  zinc sulfate 220 (50 Zn) MG capsule Take 220 mg by mouth daily.   Yes [provider]  ferrous gluconate (FERGON) 324 MG tablet Take 1 tablet (324 mg total) by mouth 2 (two) times daily with a meal. Patient not taking: Reported on 03/30/2020 05/17/16 04/01/20  Debbe Odea, MD  potassium chloride SA (K-DUR,KLOR-CON) 20 MEQ tablet Take 1 tablet (20 mEq total) by mouth 2 (two) times daily. Patient not taking: Reported on 03/30/2020 10/10/17 04/01/20  Kathie Dike, MD    Inpatient Medications: Scheduled Meds:  (feeding supplement) PROSource Plus  30 mL Oral q AM   vitamin C  500 mg Oral Daily   buPROPion ER  100 mg Oral BID   Chlorhexidine Gluconate Cloth  6 each Topical Q0600   cycloSPORINE  1 drop Both Eyes BID   diltiazem  60 mg Oral Q6H   gabapentin  300 mg Oral TID   icosapent Ethyl  1 g Oral q morning   insulin aspart  0-15 Units Subcutaneous TID WC   insulin aspart  0-5 Units Subcutaneous QHS   ketotifen  1 drop Both Eyes BID   loratadine  10 mg Oral Daily   magnesium oxide  400 mg Oral Daily   metoprolol tartrate  100 mg Oral BID   mometasone-formoterol  2 puff Inhalation BID   multivitamin with minerals  1 tablet Oral Daily   multivitamin-lutein  1 capsule Oral Daily   pantoprazole  40 mg Oral Daily   polyvinyl alcohol  2 drop Both Eyes Daily   predniSONE  5 mg Oral Q breakfast   Rivaroxaban  20 mg Oral q1800   saccharomyces boulardii  250 mg Oral BID   senna  1 tablet Oral Daily   sodium chloride flush  3 mL Intravenous Q12H   zinc sulfate  220 mg Oral Daily   Continuous Infusions:  sodium chloride     diltiazem (CARDIZEM) infusion 10 mg/hr (06/11/21 0933)   magnesium sulfate bolus IVPB     potassium chloride 10 mEq (06/11/21 0932)   PRN Meds: sodium chloride, acetaminophen **OR** acetaminophen, albuterol, diclofenac Sodium, metoprolol tartrate, nitroGLYCERIN, ondansetron **OR** ondansetron (ZOFRAN) IV, oxyCODONE, polyethylene glycol, sodium chloride  flush  Allergies:    Allergies  Allergen Reactions   Promethazine     Social History:   Social History   Tobacco Use   Smoking status: Never   Smokeless tobacco: Never  Substance Use Topics   Alcohol use: No    Alcohol/week: 0.0 standard drinks     Family History:    Family History  Problem Relation Age of Onset   Pneumonia Mother 39   Stroke Father 38   Heart failure Sister    Hypertension Sister    Heart attack Neg Hx      ROS:  Please see the history of present illness.  All other ROS reviewed and negative.     Physical Exam/Data:   Vitals:   06/11/21 0500 06/11/21 0600 06/11/21 0700 06/11/21 0758  BP: (!) 167/85 97/81    Pulse: 96 (!) 51    Resp: (!) 24 (!) 21    Temp:   98.2 F (36.8 C)   TempSrc:   Oral   SpO2: 98% 91%  91%  Weight:      Height:        Intake/Output Summary (Last 24 hours) at 06/11/2021 1013 Last data filed at 06/11/2021 0834 Gross per 24 hour  Intake 114.42 ml  Output 1150 ml  Net -1035.58 ml   Last 3 Weights 06/11/2021 06/10/2021 06/10/2021  Weight (lbs) 167 lb 1.7 oz 170 lb 3.1 oz 184 lb 1.4 oz  Weight (kg) 75.8 kg 77.2 kg 83.5 kg     Body mass index is 26.17 kg/m.  General: Pleasant elderly female appearing  in no acute distress HEENT: normal Neck: no JVD Vascular: No carotid bruits; Distal pulses 2+ bilaterally Cardiac:  normal S1, S2; Irregularly irregular Lungs:  clear to auscultation bilaterally, no wheezing, rhonchi or rales  Abd: soft, nontender, no hepatomegaly  Ext: 1+ pitting edema bilaterally.  Musculoskeletal:  No deformities, BUE and BLE strength normal and equal Skin: warm and dry  Neuro:  CNs 2-12 intact, no focal abnormalities noted Psych:  Normal affect   EKG:  The EKG was personally reviewed and demonstrates: Atrial fibrillation with RVR, HR 136. Telemetry:  Telemetry was personally reviewed and demonstrates: Atrial fibrillation, HR in 130's to 140's yesterday, in the 80's to 90's overnight.   Relevant  CV Studies:  Echocardiogram: 05/21/2021 IMPRESSIONS     1. Left ventricular ejection fraction, by estimation, is 65 to 70%. The  left ventricle has normal function. The left ventricle has no regional  wall motion abnormalities. There is mild left ventricular hypertrophy.  Left ventricular diastolic parameters  are indeterminate.   2. Right ventricular systolic function is normal. The right ventricular  size is normal. There is moderately elevated pulmonary artery systolic  pressure.   3. The mitral valve is normal in structure. Trivial mitral valve  regurgitation.   4. The aortic valve is tricuspid. Aortic valve regurgitation is not  visualized.   Comparison(s): The left ventricular function is unchanged.   Laboratory Data:  High Sensitivity Troponin:   Recent Labs  Lab 05/20/21 2212 05/21/21 0017 06/10/21 0609 06/10/21 0745  TROPONINIHS 21* 19* 10 9     Chemistry Recent Labs  Lab 06/10/21 0609 06/11/21 0404  NA 144 142  K 3.1* 2.9*  CL 105 106  CO2 29 28  GLUCOSE 100* 86  BUN 15 10  CREATININE 0.64 0.68  CALCIUM 8.8* 8.0*  MG  --  1.3*  GFRNONAA >60 >60  ANIONGAP 10 8    Recent Labs  Lab 06/10/21 0609  PROT 6.7  ALBUMIN 3.0*  AST 16  ALT 17  ALKPHOS 148*  BILITOT 1.0    Hematology Recent Labs  Lab 06/10/21 0609 06/11/21 0404  WBC 8.1 7.2  RBC 4.43 3.67*  HGB 11.1* 9.3*  HCT 38.5 31.7*  MCV 86.9 86.4  MCH 25.1* 25.3*  MCHC 28.8* 29.3*  RDW 19.6* 19.8*  PLT 298 269   Thyroid  Recent Labs  Lab 06/10/21 0745  TSH 2.266    BNP Recent Labs  Lab 06/10/21 0609  BNP 644.0*     Radiology/Studies:  DG Tibia/Fibula Left  Result Date: 06/10/2021 CLINICAL DATA:  Redness and swelling.  Infection lower leg. EXAM: LEFT TIBIA AND FIBULA - 2 VIEW COMPARISON:  None. FINDINGS: Moderate degenerative change in the lateral joint compartment of the knee. Negative ankle No fracture or osteomyelitis. Mild arterial calcification in the popliteal artery.  IMPRESSION: No acute skeletal abnormality.  Degenerative change in the knee Electronically Signed   By: Franchot Gallo M.D.   On: 06/10/2021 08:01   CT HEAD WO CONTRAST (5MM)  Result Date: 06/10/2021 CLINICAL DATA:  Unwitnessed fall. EXAM: CT HEAD WITHOUT CONTRAST TECHNIQUE: Contiguous axial images were obtained from the base of the skull through the vertex without intravenous contrast. RADIATION DOSE REDUCTION: This exam was performed according to the departmental dose-optimization program which includes automated exposure control, adjustment of the mA and/or kV according to patient size and/or use of iterative reconstruction technique. COMPARISON:  Head CT 05/21/2021. FINDINGS: Factors affecting image quality: Patient motion. Brain: Encephalomalacia of chronic right parieto-occipitotemporal  infarct is again shown. There is mild atrophy and atrophic ventriculomegaly and mild-to-moderate small vessel disease in the cerebral white matter. Mild cerebellar atrophy. Small chronic wedge-shaped infarct in the posterior right cerebellar hemisphere also chronic. No asymmetry is seen concerning for acute infarct, hemorrhage or mass. There is no midline shift. Vascular: There are calcifications of the carotid siphons but no hyperdense central vasculature noted. Skull: No displaced skull fracture is seen allowing for motion. No focal skull lesion. There is a right forehead scalp hematoma. Sinuses/Orbits: Intact orbits and orbital contents as far seen. Old lens extractions. Other: None. IMPRESSION: Motion limited exam grossly negative for acute process or depressed skull fracture. Right forehead scalp hematoma. Chronic changes including old right posterior MCA infarct. Electronically Signed   By: Telford Nab M.D.   On: 06/10/2021 07:24   DG Chest Port 1 View  Result Date: 06/10/2021 CLINICAL DATA:  76 year old female with possible sepsis. Unwitnessed fall. EXAM: PORTABLE CHEST 1 VIEW COMPARISON:  Chest x-ray  05/21/2021. FINDINGS: Lung volumes are low. Diffuse interstitial prominence and widespread peribronchial cuffing again noted throughout the lungs bilaterally, similar to the prior study and numerous other prior examinations, suggesting chronic bronchitis. No confluent consolidative airspace disease. Small right pleural effusion. No left pleural effusion. No pneumothorax. Mild cephalization of the pulmonary vasculature. Mild cardiomegaly. IMPRESSION: 1. The appearance the chest is very similar to prior studies. Findings again suggests probable chronic bronchitis, although a component of mild interstitial pulmonary edema is not excluded. Clinical correlation for signs and symptoms of congestive heart failure is recommended. 2. Persistent small right pleural effusion. Electronically Signed   By: Vinnie Langton M.D.   On: 06/10/2021 06:26     Assessment and Plan:   1. Atrial Fibrillation with RVR - She has known permanent atrial fibrillation but presented in atrial fibrillation with RVR after suffering a fall. Her elevated rates could have been triggered by the pain she experienced with this or due to her electrolyte abnormalities. - She is on IV Cardizem 15 mg/h with rates currently in the 80's to 90's. I reviewed with her nurse and will plan to wean IV Cardizem and transition to short-acting Cardizem 60 mg Q6H while continuing Lopressor 100 mg twice daily. Would ultimately plan to switch back to Cardizem CD tomorrow and she was on 240 mg daily prior to admission but this can be titrated to 300 mg daily. IV Amiodarone not currently indicated as her BP overall remains stable and she has permanent atrial fibrillation. - Continue Xarelto for anticoagulation. Dosing was adjusted to 20 mg daily given her estimated creatinine clearance of 63.6 mL/min (by C-G formula based on SCr of 0.68 mg/dL).  2. HFpEF - Echo last month showed her EF was at 65-70% with no regional WMA. BNP was at 644 on admission and CXR  showed possibly mild edema and a small right pleural effusion. She does have 1+ pitting edema on examination and would dose IV Lasix '40mg'$  x1 today or tomorrow once K+ has improved. Was taking PO Lasix PRN prior to admission.   3. HTN - Her blood pressure has been variable but was soft at 97/81 on most recent check earlier this morning.  Will try to wean IV Cardizem as outlined above and start short-acting Cardizem 60 mg every 6 hours with anticipation of switching back to Cardizem CD prior to discharge. Hold parameters are in place. Continue Lopressor 100 mg twice daily.  4. Iliopsoas Abscess - She underwent I&D last month and just completed  antibiotic therapy yesterday. [facility]   5. Hypokalemia - K+ at 2.9 today. PO and IV supplementation have been ordered by the admitting team.    Risk Assessment/Risk Scores:    CHA2DS2-VASc Score = 8   This indicates a 10.8% annual risk of stroke. The patient's score is based upon: CHF History: 0 HTN History: 1 Diabetes History: 1 Stroke History: 2 Vascular Disease History: 1 Age Score: 2 Gender Score: 1    For questions or updates, please contact [place] Please consult [email or web address] for contact info under    Signed, Erma Heritage, PA-C  06/11/2021 10:13 AM    Attending note:  Patient seen and examined.  I reviewed her records and discussed the case with Delano Metz, I agree with her above findings.  Ms. Litaker is currently admitted to the hospital after recent fall, has permanent atrial fibrillation at baseline, we are consulted to assist with heart rate control.  She was transitioned to IV diltiazem during her stay, continued on regular Lopressor dosing at 100 mg twice daily.  She is on Xarelto for stroke prophylaxis with CHA2DS2-VASc score of 8.  She is also noted to be significantly hypokalemic.  On examination this morning she appears comfortable.  Reports no chest pain or  palpitations.  She is afebrile.  Heart rate currently in the 70s to 80s in atrial fibrillation by telemetry which I personally reviewed.  Systolic pressure 338V to 130s.  Lungs exhibit diminished breath sounds throughout, no wheezing.  Cardiac exam with irregularly irregular rhythm.  She has 1+ lower extremity edema.  Pertinent lab work includes potassium 2.9, BUN 10, creatinine 0.68, hemoglobin 9.3, platelets 269, high-sensitivity troponin I levels normal, BNP 644.  I personally reviewed her ECG from 06/10/2021 which shows rapid atrial fibrillation with diffuse nonspecific ST-T changes.  Head CT reports no acute process, right forehead scalp hematoma.  Does have old ischemic changes including evidence of previous right posterior MCA infarct.  Plan is to wean off IV Cardizem and reinitiate oral dosing with Cardizem 60 mg p.o. every 6 hours, continue Lopressor 100 mg twice daily.  Would ultimately plan to transition tomorrow to Cardizem CD 240 mg daily presuming blood pressure and heart rate are stable.  She is on Xarelto for stroke prophylaxis which will continue as well.  Plan to give 1 dose of IV Lasix today, continue to replete potassium and then reevaluate need for diuretics tomorrow.  Do not anticipate further cardiac work-up at this point beyond stabilization of her medications.  Satira Sark, M.D., F.A.C.C.

## 2021-06-11 NOTE — [place] FL2 (Signed)
[place] MEDICAID FL2 LEVEL OF CARE SCREENING TOOL     IDENTIFICATION  Patient Name: [name] Birthdate: 12/26/1945 Sex: female Admission Date (Current Location): 06/10/2021  [facility] and [place] Number:  [employer] and Address:  [place] [address] [place]      Provider Number: [id number]  Attending Physician Name and Address:  Rodena Goldmann, DO  Relative Name and Phone Number:       Current Level of Care: Hospital Recommended Level of Care: [place] Prior Approval Number:    Date Approved/Denied:   PASRR Number:    Discharge Plan: Home    Current Diagnoses: Patient Active Problem List   Diagnosis Date Noted   Hypokalemia 55/73/2202   Acute metabolic encephalopathy 54/27/0623   Pressure injury of skin 05/21/2021   Sepsis due to undetermined organism ([place]) 03/30/2020   Hypomagnesemia 03/30/2020   Hypophosphatemia 03/30/2020   Hyperglycemia    Iliopsoas abscess on left ([place]) 10/04/2017   HCAP (healthcare-associated pneumonia) 10/04/2017   Acute lower UTI 10/04/2017   Hallucinations 07/25/2017   Abnormal LFTs 07/24/2017   Acute on chronic diastolic CHF (congestive heart failure) (HCC)    Elevated LFTs    AKI (acute kidney injury) ([place])    Elevated troponin    Stroke, acute, embolic ([place]) 76/28/3151   Mitral regurgitation 06/17/2017   Acute CVA (cerebrovascular accident) ([place])    Thyroid nodule 06/15/2017   Atrial fibrillation with RVR ([place]) 06/14/2017   CVA (cerebral vascular accident) ([place]) 06/14/2017   Type II diabetes mellitus (HCC)    Peripheral vascular disease (HCC)    Hyperlipidemia    Hiatal hernia    Edema    Chronic diastolic (congestive) heart failure (HCC)    Carpal tunnel syndrome    Chronic atrial fibrillation with RVR (HCC)    Anemia    Actinic keratosis    Intractable nausea and vomiting 05/31/2016   Nausea & vomiting 05/30/2016   Abnormal chest x-ray 05/30/2016    Influenza A 05/21/2016   Acute CHF (congestive heart failure) ([place]) 05/20/2016   History of GI bleed 03/29/2016   Chronic anticoagulation 03/29/2016   Symptomatic anemia 03/19/2016   Spinal stenosis of lumbar region 02/10/2016   Varicose veins of left leg with edema 09/15/2015   Preop cardiovascular exam 08/25/2015   Iron deficiency anemia due to chronic blood loss 07/17/2015   Anemia of chronic renal failure, stage 3 (moderate) (HCC) 07/17/2015   Anticoagulant causing adverse effect in therapeutic use 07/17/2015   Symptomatic bradycardia 02/21/2015   Bradycardia    Acute deep vein thrombosis (DVT) of left lower extremity ([place]) 05/06/2014   Abdominal wall mass of right lower quadrant 07/08/2013   OA (osteoarthritis) of hip 04/03/2013   Osteoarthritis of hip 01/18/2013   Night sweats 09/06/2012   Chest pain    Anxiety and depression    Gastroesophageal reflux disease    Hepatitis    Jaundice    Hypertension 07/01/2010   Hypercholesterolemia 07/01/2010   Type 2 diabetes mellitus with hyperlipidemia ([place]) 07/01/2010   Rheumatoid arthritis ([place]) 07/01/2010   Sinus tachycardia 07/01/2010   Chest pain, atypical 07/01/2010    Orientation RESPIRATION BLADDER Height & Weight     Self, Situation, Place  Normal Incontinent Weight: 167 lb 1.7 oz (75.8 kg) Height:  '5\' 7"'$  (170.2 cm)  BEHAVIORAL SYMPTOMS/MOOD NEUROLOGICAL BOWEL NUTRITION STATUS      Incontinent Diet (Heart healthy)  AMBULATORY STATUS COMMUNICATION OF NEEDS Skin  Extensive Assist (uses wc in facility.)   PU Stage and Appropriate Care (stage 2: left, heel, right buttock. Venous stasis ucer pre tipital left, right; diabetic foot ulcer left medial, left toe, left toe lateral.)                       Personal Care Assistance Level of Assistance  Bathing, Feeding, Dressing Bathing Assistance: Limited assistance Feeding assistance: Independent Dressing Assistance: Limited assistance     Functional Limitations Info   Sight, Hearing, Speech Sight Info: Adequate Hearing Info: Adequate Speech Info: Adequate    SPECIAL CARE FACTORS FREQUENCY                       Contractures Contractures Info: Not present    Additional Factors Info  Code Status, Allergies Code Status Info: DNR Allergies Info: Promethazine           Current Medications (06/11/2021):  This is the current hospital active medication list Current Facility-Administered Medications  Medication Dose Route Frequency Provider Last Rate Last Admin   (feeding supplement) PROSource Plus liquid 30 mL  30 mL Oral q AM Manuella Ghazi, Pratik D, DO   30 mL at 06/11/21 0631   0.9 %  sodium chloride infusion  250 mL Intravenous PRN Manuella Ghazi, Pratik D, DO       acetaminophen (TYLENOL) tablet 650 mg  650 mg Oral Q6H PRN Manuella Ghazi, Pratik D, DO       Or   acetaminophen (TYLENOL) suppository 650 mg  650 mg Rectal Q6H PRN Manuella Ghazi, Pratik D, DO       albuterol (PROVENTIL) (2.5 MG/3ML) 0.083% nebulizer solution 2.5 mg  2.5 mg Nebulization Q4H PRN Manuella Ghazi, Pratik D, DO       ascorbic acid (VITAMIN C) tablet 500 mg  500 mg Oral Daily Manuella Ghazi, Pratik D, DO   500 mg at 06/11/21 0934   buPROPion ER (WELLBUTRIN SR) 12 hr tablet 100 mg  100 mg Oral BID Manuella Ghazi, Pratik D, DO   100 mg at 06/11/21 0254   Chlorhexidine Gluconate Cloth 2 % PADS 6 each  6 each Topical Q0600 Heath Lark D, DO   6 each at 06/11/21 0631   cycloSPORINE (RESTASIS) 0.05 % ophthalmic emulsion 1 drop  1 drop Both Eyes BID Manuella Ghazi, Pratik D, DO   1 drop at 06/11/21 2706   diclofenac Sodium (VOLTAREN) 1 % topical gel 2 g  2 g Topical QHS PRN Manuella Ghazi, Pratik D, DO       diltiazem (CARDIZEM) 125 mg in dextrose 5% 125 mL (1 mg/mL) infusion  5-15 mg/hr Intravenous Continuous Manuella Ghazi, Pratik D, DO 5 mL/hr at 06/11/21 1035 5 mg/hr at 06/11/21 1035   diltiazem (CARDIZEM) tablet 60 mg  60 mg Oral Q6H Strader, [place] M, PA-C   60 mg at 06/11/21 1033   gabapentin (NEURONTIN) capsule 300 mg  300 mg Oral TID Heath Lark D, DO   300  mg at 06/11/21 2376   icosapent Ethyl (VASCEPA) 1 g capsule 1 g  1 g Oral q morning Manuella Ghazi, Pratik D, DO   1 g at 06/11/21 0933   insulin aspart (novoLOG) injection 0-15 Units  0-15 Units Subcutaneous TID WC Shah, Pratik D, DO   2 Units at 06/11/21 1135   insulin aspart (novoLOG) injection 0-5 Units  0-5 Units Subcutaneous QHS Shah, Pratik D, DO       ketotifen (ZADITOR) 0.025 % ophthalmic solution 1 drop  1 drop Both  Eyes BID Heath Lark D, DO   1 drop at 06/11/21 0935   loratadine (CLARITIN) tablet 10 mg  10 mg Oral Daily Heath Lark D, DO   10 mg at 06/11/21 0934   magnesium oxide (MAG-OX) tablet 400 mg  400 mg Oral Daily Heath Lark D, DO   400 mg at 06/11/21 0935   metoprolol tartrate (LOPRESSOR) injection 5 mg  5 mg Intravenous Q6H PRN Manuella Ghazi, Pratik D, DO   5 mg at 06/10/21 1028   metoprolol tartrate (LOPRESSOR) tablet 100 mg  100 mg Oral BID Heath Lark D, DO   100 mg at 06/11/21 5364   mometasone-formoterol (DULERA) 200-5 MCG/ACT inhaler 2 puff  2 puff Inhalation BID Manuella Ghazi, Pratik D, DO   2 puff at 06/11/21 0757   multivitamin with minerals tablet 1 tablet  1 tablet Oral Daily Heath Lark D, DO   1 tablet at 06/11/21 6803   multivitamin-lutein (OCUVITE-LUTEIN) capsule 1 capsule  1 capsule Oral Daily Manuella Ghazi, Pratik D, DO   1 capsule at 06/11/21 0933   nitroGLYCERIN (NITROSTAT) SL tablet 0.4 mg  0.4 mg Sublingual Q5 min PRN Manuella Ghazi, Pratik D, DO       ondansetron (ZOFRAN) tablet 4 mg  4 mg Oral Q6H PRN Manuella Ghazi, Pratik D, DO       Or   ondansetron (ZOFRAN) injection 4 mg  4 mg Intravenous Q6H PRN Manuella Ghazi, Pratik D, DO       oxyCODONE (Oxy IR/ROXICODONE) immediate release tablet 7.5 mg  7.5 mg Oral Q8H PRN Manuella Ghazi, Pratik D, DO   7.5 mg at 06/10/21 1111   pantoprazole (PROTONIX) EC tablet 40 mg  40 mg Oral Daily Manuella Ghazi, Pratik D, DO   40 mg at 06/11/21 0933   polyethylene glycol (MIRALAX / GLYCOLAX) packet 17 g  17 g Oral BID PRN Manuella Ghazi, Pratik D, DO       polyvinyl alcohol (LIQUIFILM TEARS) 1.4 % ophthalmic  solution 2 drop  2 drop Both Eyes Daily Manuella Ghazi, Pratik D, DO   2 drop at 06/11/21 0936   predniSONE (DELTASONE) tablet 5 mg  5 mg Oral Q breakfast Heath Lark D, DO   5 mg at 06/11/21 2122   rivaroxaban (XARELTO) tablet 20 mg  20 mg Oral q1800 Manuella Ghazi, Pratik D, DO   20 mg at 06/10/21 1621   saccharomyces boulardii (FLORASTOR) capsule 250 mg  250 mg Oral BID Heath Lark D, DO   250 mg at 06/11/21 4825   senna (SENOKOT) tablet 8.6 mg  1 tablet Oral Daily Heath Lark D, DO   8.6 mg at 06/11/21 0037   sodium chloride flush (NS) 0.9 % injection 3 mL  3 mL Intravenous Q12H Shah, Pratik D, DO   3 mL at 06/11/21 0488   sodium chloride flush (NS) 0.9 % injection 3 mL  3 mL Intravenous PRN Manuella Ghazi, Pratik D, DO       zinc sulfate capsule 220 mg  220 mg Oral Daily Manuella Ghazi, Pratik D, DO   220 mg at 06/11/21 8916     Discharge Medications: Please see discharge summary for a list of discharge medications.  Relevant Imaging Results:  Relevant Lab Results:   Additional Information SSN: [id number]  Ihor Gully, LCSW

## 2021-06-11 NOTE — Hospital Course (Addendum)
Per HPI: [name] is a 76 y.o. female with medical history significant of paroxysmal atrial fibrillation on Xarelto for anticoagulation, chronic diastolic heart failure, dyslipidemia, hypertension, type 2 diabetes, prior CVA, peripheral vascular disease, rheumatoid arthritis, and chronic lower extremity wounds who presented to the ED after a fall at her SNF that was unwitnessed.  Upon arrival to the ED, she was found to be in atrial fibrillation with RVR and was also noted to be quite hypertensive.  She denies any chest pain, palpitations, or shortness of breath and complains of her left lower extremity erythema.  She appears to have been seen by wound care on 3/8 with debridements done at that time with no concern for cellulitis.   She was recently hospitalized for iliopsoas abscess that was drained and discharged on 2/21.  She has been completing her antibiotics at SNF.   In the ED, CT head was performed with no acute findings and she was noted to have atrial fibrillation with RVR with heart rate in the 150 bpm range.  She has been started on Cardizem drip for heart rate control.  06/11/21: Patient has been admitted for heart rate control with noted atrial fibrillation with RVR.  She continues to remain on Cardizem drip.  Cardiology has assessed patient with recommendations to change Cardizem dosing today.  Electrolyte abnormalities to be corrected.  06/12/21: Patient continues to have elevated heart rates requiring further adjustment of oral Cardizem dose.  Remains off Cardizem drip at this time.  We will also administer 1 dose of IV Lasix per cardiology recommendations on 3/10.  06/13/21: Heart rates are better controlled with increased dose of Cardizem to 90 mg every 6 hours.  Plan to administer 3 more doses and then anticipate cardiology follow-up in a.m. with likely consolidation of dose versus increase after further evaluation.  06/14/21: Heart rates demonstrate worsening control and will  require reinitiation of Cardizem drip.  Cardiology following with plans to load digoxin today.  Initially noted to have increased lactic acid levels which are now downtrending.  Evaluating for source of infection.

## 2021-06-11 NOTE — TOC Initial Note (Signed)
Transition of Care (TOC) - Initial/Assessment Note  ? ? ?Patient Details  ?Name: [name] ?MRN: [id number] ?Date of Birth: [birth year] ? ?Transition of Care (TOC) CM/SW Contact:    ?Cortnie Ringel D, LCSW ?Phone Number: ?06/11/2021, 4:27 PM ? ?Clinical Narrative:                 ?Patient is a LTC resident at [facility]. Patient has experienced a drastic decline. Continues to slide out of her wc. Admitted for atrial fibrillation with RVR. Can return to facility at d/c.  ? ?Expected Discharge Plan: [place] ?Barriers to Discharge: Continued Medical Work up ? ? ?Patient Goals and CMS Choice ?  ?  ?  ? ?Expected Discharge Plan and Services ?Expected Discharge Plan: [place] ?  ?  ?Post Acute Care Choice: [place] ?Living arrangements for the past 2 months: [place] ?                ?  ?  ?  ?  ?  ?  ?  ?  ?  ?  ? ?Prior Living Arrangements/Services ?Living arrangements for the past 2 months: [place] ?Lives with:: Facility Resident ?Patient language and need for interpreter reviewed:: Yes ?       ?Need for Family Participation in Patient Care: Yes (Comment) ?Care giver support system in place?: Yes (comment) ?  ?Criminal Activity/Legal Involvement Pertinent to Current Situation/Hospitalization: No - Comment as needed ? ?Activities of Daily Living ?Home Assistive Devices/Equipment: Dentures (specify type), Hearing aid, Walker (specify type), Wheelchair ?ADL Screening (condition at time of admission) ?Patient's cognitive ability adequate to safely complete daily activities?: Yes ?Is the patient deaf or have difficulty hearing?: No ?Does the patient have difficulty seeing, even when wearing glasses/contacts?: No ?Does the patient have difficulty concentrating, remembering, or making decisions?: No ?Patient able to express need for assistance with ADLs?: Yes ?Does the patient have difficulty dressing or bathing?: No ?Independently performs  ADLs?: Yes (appropriate for developmental age) ?Does the patient have difficulty walking or climbing stairs?: Yes ?Weakness of Legs: Both ?Weakness of Arms/Hands: None ? ?Permission Sought/Granted ?Permission sought to share information with : Customer service manager ?  ? Share Information with NAME: Hailey, facility social worker ?   ?   ?   ? ?Emotional Assessment ?Appearance:: Appears stated age ?  ?Affect (typically observed): Appropriate ?Orientation: : Oriented to Self, Oriented to Place, Oriented to Situation ?Alcohol / Substance Use: Not Applicable ?Psych Involvement: No (comment) ? ?Admission diagnosis:  Primary hypertension [I10] ?Cellulitis of left lower extremity [L03.116] ?Atrial fibrillation with RVR ([place]) [I48.91] ?Contusion of scalp, initial encounter [S00.03XA] ?Patient Active Problem List  ? Diagnosis Date Noted  ? Hypokalemia 05/21/2021  ? Acute metabolic encephalopathy 33/54/5625  ? Pressure injury of skin 05/21/2021  ? Sepsis due to undetermined organism ([place]) 03/30/2020  ? Hypomagnesemia 03/30/2020  ? Hypophosphatemia 03/30/2020  ? Hyperglycemia   ? Iliopsoas abscess on left ([place]) 10/04/2017  ? HCAP (healthcare-associated pneumonia) 10/04/2017  ? Acute lower UTI 10/04/2017  ? Hallucinations 07/25/2017  ? Abnormal LFTs 07/24/2017  ? Acute on chronic diastolic CHF (congestive heart failure) ([place])   ? Elevated LFTs   ? AKI (acute kidney injury) ([place])   ? Elevated troponin   ? Stroke, acute, embolic ([place]) 63/89/3734  ? Mitral regurgitation 06/17/2017  ? Acute CVA (cerebrovascular accident) ([place])   ? Thyroid nodule 06/15/2017  ? Atrial fibrillation with RVR ([place]) 06/14/2017  ? CVA (  cerebral vascular accident) ([place]) 06/14/2017  ? Type II diabetes mellitus ([place])   ? Peripheral vascular disease ([place])   ? Hyperlipidemia   ? Hiatal hernia   ? Edema   ? Chronic diastolic (congestive) heart failure (HCC)   ? Carpal tunnel syndrome   ? Chronic atrial fibrillation with RVR (HCC)   ? Anemia   ?  Actinic keratosis   ? Intractable nausea and vomiting 05/31/2016  ? Nausea & vomiting 05/30/2016  ? Abnormal chest x-ray 05/30/2016  ? Influenza A 05/21/2016  ? Acute CHF (congestive heart failure) ([place]) 05/20/2016  ? History of GI bleed 03/29/2016  ? Chronic anticoagulation 03/29/2016  ? Symptomatic anemia 03/19/2016  ? Spinal stenosis of lumbar region 02/10/2016  ? Varicose veins of left leg with edema 09/15/2015  ? Preop cardiovascular exam 08/25/2015  ? Iron deficiency anemia due to chronic blood loss 07/17/2015  ? Anemia of chronic renal failure, stage 3 (moderate) ([place]) 07/17/2015  ? Anticoagulant causing adverse effect in therapeutic use 07/17/2015  ? Symptomatic bradycardia 02/21/2015  ? Bradycardia   ? Acute deep vein thrombosis (DVT) of left lower extremity ([place]) 05/06/2014  ? Abdominal wall mass of right lower quadrant 07/08/2013  ? OA (osteoarthritis) of hip 04/03/2013  ? Osteoarthritis of hip 01/18/2013  ? Night sweats 09/06/2012  ? Chest pain   ? Anxiety and depression   ? Gastroesophageal reflux disease   ? Hepatitis   ? Jaundice   ? Hypertension 07/01/2010  ? Hypercholesterolemia 07/01/2010  ? Type 2 diabetes mellitus with hyperlipidemia ([place]) 07/01/2010  ? Rheumatoid arthritis ([place]) 07/01/2010  ? Sinus tachycardia 07/01/2010  ? Chest pain, atypical 07/01/2010  ? ?PCP:  Aletha Halim., PA-C ?Pharmacy:   ?[facility] [place] ?[address] ?[place] [address] ?Phone: [phone] Fax: [phone] ? ? ? ? ?Social Determinants of Health (SDOH) Interventions ?  ? ?Readmission Risk Interventions ?Readmission Risk Prevention Plan 05/21/2021 04/01/2020  ?Transportation Screening Complete Complete  ?PCP or Specialist Appt within 3-5 Days - Not Complete  ?Not Complete comments - Patient's PCP appointments are set up at her facility  ?[place] or Home Care Consult - Complete  ?Social Work Consult for [place] Planning/Counseling - Complete  ?Palliative  Care Screening - Complete  ?Medication Review Press photographer) Complete Complete  ?SW Recovery Care/Counseling Consult Complete -  ?[place] Not Applicable -  ?Some recent data might be hidden  ? ? ? ?

## 2021-06-11 NOTE — Progress Notes (Signed)
PROGRESS NOTE    [name]  ZOX:[id number] DOB: [birth year] DOA: 06/10/2021 PCP: Richmond Campbell., PA-C   Brief Narrative:  Per HPI: [name] is a 76 y.o. female with medical history significant of paroxysmal atrial fibrillation on Xarelto for anticoagulation, chronic diastolic heart failure, dyslipidemia, hypertension, type 2 diabetes, prior CVA, peripheral vascular disease, rheumatoid arthritis, and chronic lower extremity wounds who presented to the ED after a fall at her [facility] that was unwitnessed.  Upon arrival to the ED, she was found to be in atrial fibrillation with RVR and was also noted to be quite hypertensive.  She denies any chest pain, palpitations, or shortness of breath and complains of her left lower extremity erythema.  She appears to have been seen by wound care on 3/8 with debridements done at that time with no concern for cellulitis.   She was recently hospitalized for iliopsoas abscess that was drained and discharged on 2/21.  She has been completing her antibiotics at SNF.   In the ED, CT head was performed with no acute findings and she was noted to have atrial fibrillation with RVR with heart rate in the 150 bpm range.  She has been started on Cardizem drip for heart rate control.  06/11/21: Patient has been admitted for heart rate control with noted atrial fibrillation with RVR.  She continues to remain on Cardizem drip.  Cardiology has assessed patient with recommendations to change Cardizem dosing today.  Electrolyte abnormalities to be corrected.    Assessment & Plan:   Principal Problem:   Atrial fibrillation with RVR (HCC) Active Problems:   Hypertension   Type 2 diabetes mellitus with hyperlipidemia (HCC)   Rheumatoid arthritis (HCC)   Peripheral vascular disease (HCC)   Chronic diastolic (congestive) heart failure (HCC)   Iliopsoas abscess on left (HCC)   Hypokalemia  Assessment and Plan: * Atrial fibrillation with RVR (HCC) Continue on  Cardizem drip as well as home oral dose of Cardizem that has been changed to 60 mg every 6 hours per cardiology TSH within normal limits Recent 2D echocardiogram noted on 2/23 and will avoid for now IV metoprolol as needed for elevated heart rates Likely will require increasing doses of Cardizem upon discharge Continue anticoagulation with Xarelto  Hypokalemia Replete and reevaluate along with magnesium  Iliopsoas abscess on left [facility]) Patient finishing up Duricef today  Chronic diastolic (congestive) heart failure (HCC) Holding Lasix for now given electrolyte abnormalities Anticipate 1 dose of IV Lasix in a.m. once abnormalities have been corrected 2D echocardiogram recently performed  Peripheral vascular disease (HCC) Noted to have wounds on lower extremities which is being followed by outpatient wound care 3/8  Rheumatoid arthritis (HCC) Continue prednisone  Type 2 diabetes mellitus with hyperlipidemia (HCC) SSI while inpatient Hold statin for now and check CK levels given fall  Hypertension Noted to have elevated blood pressures on admission, monitor    DVT prophylaxis: Xarelto Code Status: DNR Family Communication: Discussed with son on phone 3/10 Disposition Plan:  Status is: Inpatient Remains inpatient appropriate because: Need for IV Cardizem drip for heart rate control.   Nutritional Assessment:  The patients BMI is: Body mass index is 26.17 kg/m.[name]  Seen by dietician.  I agree with the assessment and plan as outlined below:  Nutrition Status:       Skin Assessment:  I have examined the patients skin and I agree with the wound assessment as performed by the wound care RN as outlined below:  Pressure  Injury 05/21/21 Buttocks Right Stage 2 -  Partial thickness loss of dermis presenting as a shallow open injury with a red, pink wound bed without slough. (Active)  05/21/21 1131  Location: Buttocks  Location Orientation: Right  Staging: Stage 2 -   Partial thickness loss of dermis presenting as a shallow open injury with a red, pink wound bed without slough.  Wound Description (Comments):   Present on Admission: Yes     Pressure Injury 05/21/21 Buttocks Right Stage 2 -  Partial thickness loss of dermis presenting as a shallow open injury with a red, pink wound bed without slough. (Active)  05/21/21 1132  Location: Buttocks  Location Orientation: Right  Staging: Stage 2 -  Partial thickness loss of dermis presenting as a shallow open injury with a red, pink wound bed without slough.  Wound Description (Comments):   Present on Admission: Yes     Pressure Injury 06/10/21 Heel Left Stage 2 -  Partial thickness loss of dermis presenting as a shallow open injury with a red, pink wound bed without slough. (Active)  06/10/21 1435  Location: Heel  Location Orientation: Left (Heel)  Staging: Stage 2 -  Partial thickness loss of dermis presenting as a shallow open injury with a red, pink wound bed without slough.  Wound Description (Comments):   Present on Admission: Yes    Consultants:  Cardiology  Procedures:  See below  Antimicrobials:  Anti-infectives (From admission, onward)    Start     Dose/Rate Route Frequency Ordered Stop   06/10/21 2200  cefadroxil (DURICEF) capsule 1,000 mg  Status:  Discontinued        1,000 mg Oral 2 times daily 06/10/21 1012 06/11/21 0818   06/10/21 1000  cefadroxil (DURICEF) capsule 1,000 mg  Status:  Discontinued        1,000 mg Oral 2 times daily 06/10/21 0943 06/10/21 1012      Subjective: Patient seen and evaluated today with no new acute complaints or concerns. No acute concerns or events noted overnight.  She continues to have elevated heart rates.  Objective: Vitals:   06/11/21 0700 06/11/21 0758 06/11/21 1000 06/11/21 1100  BP:   (!) 123/98   Pulse:   79   Resp:   (!) 25   Temp: 98.2 F (36.8 C)   (!) 96.4 F (35.8 C)  TempSrc: Oral   Axillary  SpO2:  91% 93%   Weight:       Height:        Intake/Output Summary (Last 24 hours) at 06/11/2021 1155 Last data filed at 06/11/2021 1151 Gross per 24 hour  Intake 114.42 ml  Output 1900 ml  Net -1785.58 ml   Filed Weights   06/10/21 0533 06/10/21 1441 06/11/21 0437  Weight: 83.5 kg 77.2 kg 75.8 kg    Examination:  General exam: Appears calm and comfortable  Respiratory system: Clear to auscultation. Respiratory effort normal. Cardiovascular system: S1 & S2 heard, irregular and tachycardic. Gastrointestinal system: Abdomen is soft Central nervous system: Alert and awake Extremities: Scant pitting edema. Skin: No significant lesions noted Psychiatry: Flat affect.    Data Reviewed: I have personally reviewed following labs and imaging studies  CBC: Recent Labs  Lab 06/10/21 0609 06/11/21 0404  WBC 8.1 7.2  NEUTROABS 5.6  --   HGB 11.1* 9.3*  HCT 38.5 31.7*  MCV 86.9 86.4  PLT 298 269   Basic Metabolic Panel: Recent Labs  Lab 06/10/21 0609 06/11/21 0404  NA 144 142  K 3.1* 2.9*  CL 105 106  CO2 29 28  GLUCOSE 100* 86  BUN 15 10  CREATININE 0.64 0.68  CALCIUM 8.8* 8.0*  MG  --  1.3*   GFR: Estimated Creatinine Clearance: 63.6 mL/min (by C-G formula based on SCr of 0.68 mg/dL). Liver Function Tests: Recent Labs  Lab 06/10/21 0609  AST 16  ALT 17  ALKPHOS 148*  BILITOT 1.0  PROT 6.7  ALBUMIN 3.0*   No results for input(s): LIPASE, AMYLASE in the last 168 hours. No results for input(s): AMMONIA in the last 168 hours. Coagulation Profile: Recent Labs  Lab 06/10/21 0609  INR 1.3*   Cardiac Enzymes: Recent Labs  Lab 06/10/21 0745  CKTOTAL 18*   BNP (last 3 results) No results for input(s): PROBNP in the last 8760 hours. HbA1C: No results for input(s): HGBA1C in the last 72 hours. CBG: Recent Labs  Lab 06/10/21 1152 06/10/21 1610 06/10/21 2136 06/11/21 0739 06/11/21 1058  GLUCAP 107* 131* 159* 166* 126*   Lipid Profile: No results for input(s): CHOL, HDL,  LDLCALC, TRIG, CHOLHDL, LDLDIRECT in the last 72 hours. Thyroid Function Tests: Recent Labs    06/10/21 0745  TSH 2.266   Anemia Panel: No results for input(s): VITAMINB12, FOLATE, FERRITIN, TIBC, IRON, RETICCTPCT in the last 72 hours. Sepsis Labs: Recent Labs  Lab 06/10/21 0609  LATICACIDVEN 1.5    Recent Results (from the past 240 hour(s))  Resp Panel by RT-PCR (Flu A&B, Covid) Nasopharyngeal Swab     Status: None   Collection Time: 06/10/21  5:58 AM   Specimen: Nasopharyngeal Swab; Nasopharyngeal(NP) swabs in vial transport medium  Result Value Ref Range Status   SARS Coronavirus 2 by RT PCR NEGATIVE NEGATIVE Final    Comment: (NOTE) SARS-CoV-2 target nucleic acids are NOT DETECTED.  The SARS-CoV-2 RNA is generally detectable in upper respiratory specimens during the acute phase of infection. The lowest concentration of SARS-CoV-2 viral copies this assay can detect is 138 copies/mL. A negative result does not preclude SARS-Cov-2 infection and should not be used as the sole basis for treatment or other patient management decisions. A negative result may occur with  improper specimen collection/handling, submission of specimen other than nasopharyngeal swab, presence of viral mutation(s) within the areas targeted by this assay, and inadequate number of viral copies(<138 copies/mL). A negative result must be combined with clinical observations, patient history, and epidemiological information. The expected result is Negative.  Fact Sheet for Patients:  BloggerCourse.com  Fact Sheet for Healthcare Providers:  SeriousBroker.it  This test is no t yet approved or cleared by the [place] FDA and  has been authorized for detection and/or diagnosis of SARS-CoV-2 by FDA under an Emergency Use Authorization (EUA). This EUA will remain  in effect (meaning this test can be used) for the duration of the COVID-19 declaration  under Section 564(b)(1) of the Act, 21 U.S.C.section 360bbb-3(b)(1), unless the authorization is terminated  or revoked sooner.       Influenza A by PCR NEGATIVE NEGATIVE Final   Influenza B by PCR NEGATIVE NEGATIVE Final    Comment: (NOTE) The Xpert Xpress SARS-CoV-2/FLU/RSV plus assay is intended as an aid in the diagnosis of influenza from Nasopharyngeal swab specimens and should not be used as a sole basis for treatment. Nasal washings and aspirates are unacceptable for Xpert Xpress SARS-CoV-2/FLU/RSV testing.  Fact Sheet for Patients: BloggerCourse.com  Fact Sheet for Healthcare Providers: SeriousBroker.it  This test is not yet approved or cleared by the  [place] [employer] trader and has been authorized for detection and/or diagnosis of SARS-CoV-2 by FDA under an TEFL teacher (EUA). This EUA will remain in effect (meaning this test can be used) for the duration of the COVID-19 declaration under Section 564(b)(1) of the Act, 21 U.S.C. section 360bbb-3(b)(1), unless the authorization is terminated or revoked.  Performed at [facility], [address], [place], [place] [address]   Blood Culture (routine x 2)     Status: None (Preliminary result)   Collection Time: 06/10/21  6:07 AM   Specimen: BLOOD RIGHT ARM  Result Value Ref Range Status   Specimen Description BLOOD RIGHT ARM  Final   Special Requests   Final    BOTTLES DRAWN AEROBIC AND ANAEROBIC Blood Culture results may not be optimal due to an excessive volume of blood received in culture bottles   Culture   Final    NO GROWTH < 24 HOURS Performed at [facility], [address] [place], [place] [address]    Report Status PENDING  Incomplete  Blood Culture (routine x 2)     Status: None (Preliminary result)   Collection Time: 06/10/21  6:07 AM   Specimen: BLOOD LEFT WRIST  Result Value Ref Range Status   Specimen Description BLOOD LEFT WRIST  Final    Special Requests   Final    Blood Culture results may not be optimal due to an inadequate volume of blood received in culture bottles   Culture   Final    NO GROWTH < 24 HOURS Performed at [facility], [address] [place], [place] [address]    Report Status PENDING  Incomplete  MRSA Next Gen by PCR, Nasal     Status: Abnormal   Collection Time: 06/10/21  2:27 PM   Specimen: Nasal Mucosa; Nasal Swab  Result Value Ref Range Status   MRSA by PCR Next Gen DETECTED (A) NOT DETECTED Final    Comment: RESULT CALLED TO, READ BACK BY AND VERIFIED WITH: E. [facility] RN [phone] K FORSYTH (NOTE) The GeneXpert MRSA Assay (FDA approved for NASAL specimens only), is one component of a comprehensive MRSA colonization surveillance program. It is not intended to diagnose MRSA infection nor to guide or monitor treatment for MRSA infections. Test performance is not FDA approved in patients less than 35 years old. Performed at [facility], [address] [place], [place] [address]          Radiology Studies: DG Tibia/Fibula Left  Result Date: 06/10/2021 CLINICAL DATA:  Redness and swelling.  Infection lower leg. EXAM: LEFT TIBIA AND FIBULA - 2 VIEW COMPARISON:  None. FINDINGS: Moderate degenerative change in the lateral joint compartment of the knee. Negative ankle No fracture or osteomyelitis. Mild arterial calcification in the popliteal artery. IMPRESSION: No acute skeletal abnormality.  Degenerative change in the knee Electronically Signed   By: Marlan Palau M.D.   On: 06/10/2021 08:01   CT HEAD WO CONTRAST ( )  Result Date: 06/10/2021 CLINICAL DATA:  Unwitnessed fall. EXAM: CT HEAD WITHOUT CONTRAST TECHNIQUE: Contiguous axial images were obtained from the base of the skull through the vertex without intravenous contrast. RADIATION DOSE REDUCTION: This exam was performed according to the departmental dose-optimization program which includes automated exposure control, adjustment of the mA  and/or kV according to patient size and/or use of iterative reconstruction technique. COMPARISON:  Head CT 05/21/2021. FINDINGS: Factors affecting image quality: Patient motion. Brain: Encephalomalacia of chronic right parieto-occipitotemporal infarct is again shown. There is mild  atrophy and atrophic ventriculomegaly and mild-to-moderate small vessel disease in the cerebral white matter. Mild cerebellar atrophy. Small chronic wedge-shaped infarct in the posterior right cerebellar hemisphere also chronic. No asymmetry is seen concerning for acute infarct, hemorrhage or mass. There is no midline shift. Vascular: There are calcifications of the carotid siphons but no hyperdense central vasculature noted. Skull: No displaced skull fracture is seen allowing for motion. No focal skull lesion. There is a right forehead scalp hematoma. Sinuses/Orbits: Intact orbits and orbital contents as far seen. Old lens extractions. Other: None. IMPRESSION: Motion limited exam grossly negative for acute process or depressed skull fracture. Right forehead scalp hematoma. Chronic changes including old right posterior MCA infarct. Electronically Signed   By: Almira Bar M.D.   On: 06/10/2021 07:24   DG Chest Port 1 View  Result Date: 06/10/2021 CLINICAL DATA:  76 year old female with possible sepsis. Unwitnessed fall. EXAM: PORTABLE CHEST 1 VIEW COMPARISON:  Chest x-ray 05/21/2021. FINDINGS: Lung volumes are low. Diffuse interstitial prominence and widespread peribronchial cuffing again noted throughout the lungs bilaterally, similar to the prior study and numerous other prior examinations, suggesting chronic bronchitis. No confluent consolidative airspace disease. Small right pleural effusion. No left pleural effusion. No pneumothorax. Mild cephalization of the pulmonary vasculature. Mild cardiomegaly. IMPRESSION: 1. The appearance the chest is very similar to prior studies. Findings again suggests probable chronic bronchitis,  although a component of mild interstitial pulmonary edema is not excluded. Clinical correlation for signs and symptoms of congestive heart failure is recommended. 2. Persistent small right pleural effusion. Electronically Signed   By: Trudie Reed M.D.   On: 06/10/2021 06:26        Scheduled Meds:  (feeding supplement) PROSource Plus  30 mL Oral q AM   vitamin C  500 mg Oral Daily   buPROPion ER  100 mg Oral BID   Chlorhexidine Gluconate Cloth  6 each Topical Q0600   cycloSPORINE  1 drop Both Eyes BID   diltiazem  60 mg Oral Q6H   gabapentin  300 mg Oral TID   icosapent Ethyl  1 g Oral q morning   insulin aspart  0-15 Units Subcutaneous TID WC   insulin aspart  0-5 Units Subcutaneous QHS   ketotifen  1 drop Both Eyes BID   loratadine  10 mg Oral Daily   magnesium oxide  400 mg Oral Daily   metoprolol tartrate  100 mg Oral BID   mometasone-formoterol  2 puff Inhalation BID   multivitamin with minerals  1 tablet Oral Daily   multivitamin-lutein  1 capsule Oral Daily   pantoprazole  40 mg Oral Daily   polyvinyl alcohol  2 drop Both Eyes Daily   predniSONE  5 mg Oral Q breakfast   Rivaroxaban  20 mg Oral q1800   saccharomyces boulardii  250 mg Oral BID   senna  1 tablet Oral Daily   sodium chloride flush  3 mL Intravenous Q12H   zinc sulfate  220 mg Oral Daily   Continuous Infusions:  sodium chloride     diltiazem (CARDIZEM) infusion 5 mg/hr (06/11/21 1035)   magnesium sulfate bolus IVPB     potassium chloride 10 mEq (06/11/21 1131)     LOS: 1 day    Time spent: 35 minutes    Etter Royall Hoover Brunette, DO [facility]  If 7PM-7AM, please contact night-coverage [email or web address] 06/11/2021, 11:55 AM

## 2021-06-12 DIAGNOSIS — I4891 Unspecified atrial fibrillation: Secondary | ICD-10-CM | POA: Diagnosis not present

## 2021-06-12 LAB — BASIC METABOLIC PANEL
Anion gap: 11 (ref 5–15)
BUN: 12 mg/dL (ref 8–23)
CO2: 25 mmol/L (ref 22–32)
Calcium: 8.6 mg/dL — ABNORMAL LOW (ref 8.9–10.3)
Chloride: 110 mmol/L (ref 98–111)
Creatinine, Ser: 0.72 mg/dL (ref 0.44–1.00)
GFR, Estimated: >60 mL/min (ref >60)
Glucose, Bld: 128 mg/dL — ABNORMAL HIGH (ref 70–99)
Potassium: 4.1 mmol/L (ref 3.5–5.1)
Sodium: 146 mmol/L — ABNORMAL HIGH (ref 135–145)

## 2021-06-12 LAB — CBC
HCT: 37.9 % (ref 36.0–46.0)
Hemoglobin: 11 g/dL — ABNORMAL LOW (ref 12.0–15.0)
MCH: 25.8 pg — ABNORMAL LOW (ref 26.0–34.0)
MCHC: 29 g/dL — ABNORMAL LOW (ref 30.0–36.0)
MCV: 89 fL (ref 80.0–100.0)
Platelets: 283 10*3/uL (ref 150–400)
RBC: 4.26 MIL/uL (ref 3.87–5.11)
RDW: 20.2 % — ABNORMAL HIGH (ref 11.5–15.5)
WBC: 9.7 10*3/uL (ref 4.0–10.5)
nRBC: 0 % (ref 0.0–0.2)

## 2021-06-12 LAB — GLUCOSE, CAPILLARY
Glucose-Capillary: 125 mg/dL — ABNORMAL HIGH (ref 70–99)
Glucose-Capillary: 130 mg/dL — ABNORMAL HIGH (ref 70–99)
Glucose-Capillary: 118 mg/dL — ABNORMAL HIGH (ref 70–99)
Glucose-Capillary: 93 mg/dL (ref 70–99)

## 2021-06-12 LAB — MAGNESIUM: Magnesium: 1.8 mg/dL (ref 1.7–2.4)

## 2021-06-12 MED ORDER — HALOPERIDOL LACTATE 5 MG/ML IJ SOLN
2.0000 mg | Freq: Once | INTRAMUSCULAR | Status: AC
  Administered 2021-06-12: 2 mg via INTRAMUSCULAR
  Filled 2021-06-12: qty 1

## 2021-06-12 MED ORDER — ATORVASTATIN CALCIUM 10 MG PO TABS
20.0000 mg | ORAL_TABLET | Freq: Every day | ORAL | Status: DC
  Administered 2021-06-12 – 2021-06-21 (×8): 20 mg via ORAL
  Filled 2021-06-12: qty 1
  Filled 2021-06-12: qty 2
  Filled 2021-06-12 (×2): qty 1
  Filled 2021-06-12: qty 2
  Filled 2021-06-12: qty 1
  Filled 2021-06-12 (×2): qty 2
  Filled 2021-06-12: qty 1

## 2021-06-12 MED ORDER — MAGNESIUM SULFATE 2 GM/50ML IV SOLN
2.0000 g | Freq: Once | INTRAVENOUS | Status: AC
  Administered 2021-06-12: 2 g via INTRAVENOUS
  Filled 2021-06-12: qty 50

## 2021-06-12 MED ORDER — FUROSEMIDE 10 MG/ML IJ SOLN
40.0000 mg | Freq: Once | INTRAMUSCULAR | Status: AC
  Administered 2021-06-12: 40 mg via INTRAVENOUS
  Filled 2021-06-12: qty 4

## 2021-06-12 MED ORDER — DILTIAZEM HCL 60 MG PO TABS
90.0000 mg | ORAL_TABLET | Freq: Four times a day (QID) | ORAL | Status: DC
Start: 2021-06-12 — End: 2021-06-13
  Administered 2021-06-12 – 2021-06-13 (×4): 90 mg via ORAL
  Filled 2021-06-12 (×4): qty 1

## 2021-06-12 NOTE — Progress Notes (Signed)
PROGRESS NOTE    [name]  FOY:[id number] DOB: 1946-03-30 DOA: 06/10/2021 PCP: Aletha Halim., PA-C   Brief Narrative:  Per HPI: [name] is a 76 y.o. female with medical history significant of paroxysmal atrial fibrillation on Xarelto for anticoagulation, chronic diastolic heart failure, dyslipidemia, hypertension, type 2 diabetes, prior CVA, peripheral vascular disease, rheumatoid arthritis, and chronic lower extremity wounds who presented to the ED after a fall at her SNF that was unwitnessed.  Upon arrival to the ED, she was found to be in atrial fibrillation with RVR and was also noted to be quite hypertensive.  She denies any chest pain, palpitations, or shortness of breath and complains of her left lower extremity erythema.  She appears to have been seen by wound care on 3/8 with debridements done at that time with no concern for cellulitis.   She was recently hospitalized for iliopsoas abscess that was drained and discharged on 2/21.  She has been completing her antibiotics at SNF.   In the ED, CT head was performed with no acute findings and she was noted to have atrial fibrillation with RVR with heart rate in the 150 bpm range.  She has been started on Cardizem drip for heart rate control.  06/11/21: Patient has been admitted for heart rate control with noted atrial fibrillation with RVR.  She continues to remain on Cardizem drip.  Cardiology has assessed patient with recommendations to change Cardizem dosing today.  Electrolyte abnormalities to be corrected.    Assessment & Plan:   Principal Problem:   Atrial fibrillation with RVR (HCC) Active Problems:   Hypertension   Type 2 diabetes mellitus with hyperlipidemia (HCC)   Rheumatoid arthritis (HCC)   Peripheral vascular disease (HCC)   Chronic diastolic (congestive) heart failure (HCC)   Iliopsoas abscess on left (HCC)   Hypokalemia  Assessment and Plan:  Atrial fibrillation with RVR (HCC) Cardizem drip  discontinued, increase Cardizem oral dosing to 90 mg every 6 hours and follow TSH within normal limits Recent 2D echocardiogram noted on 2/23 and will avoid for now IV metoprolol as needed for elevated heart rates Likely will require increasing doses of Cardizem upon discharge Continue anticoagulation with Xarelto   Hypokalemia Currently resolved   Iliopsoas abscess on left [facility]) Patient finishing up Duricef today   Chronic diastolic (congestive) heart failure (HCC) Okay to give 1 dose of IV Lasix today per cardiology recommendations 2D echocardiogram recently performed   Peripheral vascular disease ([place]) Noted to have wounds on lower extremities which is being followed by outpatient wound care 3/8   Rheumatoid arthritis ([place]) Continue prednisone   Type 2 diabetes mellitus with hyperlipidemia ([place]) SSI while inpatient Resume statin   Hypertension Noted to have elevated blood pressures on admission, monitor She had improved with increasing doses of Cardizem as well as dosing of Lasix       DVT prophylaxis: Xarelto Code Status: DNR Family Communication: Discussed with son on phone 3/11 Disposition Plan:  Status is: Inpatient Remains inpatient appropriate because: Need for IV Cardizem drip for heart rate control.     Nutritional Assessment:   The patients BMI is: Body mass index is 26.17 kg/m.[name]   Seen by dietician.  I agree with the assessment and plan as outlined below:   Nutrition Status: Skin Assessment:   I have examined the patients skin and I agree with the wound assessment as performed by the wound care RN as outlined below:   Pressure Injury 05/21/21 Buttocks Right  Stage 2 -  Partial thickness loss of dermis presenting as a shallow open injury with a red, pink wound bed without slough. (Active)  05/21/21 1131  Location: Buttocks  Location Orientation: Right  Staging: Stage 2 -  Partial thickness loss of dermis presenting as a shallow open injury with a  red, pink wound bed without slough.  Wound Description (Comments):   Present on Admission: Yes     Pressure Injury 05/21/21 Buttocks Right Stage 2 -  Partial thickness loss of dermis presenting as a shallow open injury with a red, pink wound bed without slough. (Active)  05/21/21 1132  Location: Buttocks  Location Orientation: Right  Staging: Stage 2 -  Partial thickness loss of dermis presenting as a shallow open injury with a red, pink wound bed without slough.  Wound Description (Comments):   Present on Admission: Yes     Pressure Injury 06/10/21 Heel Left Stage 2 -  Partial thickness loss of dermis presenting as a shallow open injury with a red, pink wound bed without slough. (Active)  06/10/21 1435  Location: Heel  Location Orientation: Left (Heel)  Staging: Stage 2 -  Partial thickness loss of dermis presenting as a shallow open injury with a red, pink wound bed without slough.  Wound Description (Comments):   Present on Admission: Yes      Consultants:  Cardiology   Procedures:  See below  Subjective: Patient seen and evaluated today with no new acute complaints or concerns. No acute concerns or events noted overnight.  Objective: Vitals:   06/12/21 0800 06/12/21 0802 06/12/21 0900 06/12/21 1000  BP: (!) 190/102  (!) 156/102 (!) 178/101  Pulse: (!) 118  (!) 51 72  Resp: (!) 27  20 (!) 24  Temp:      TempSrc:      SpO2: 97% 99% 96% 93%  Weight:      Height:        Intake/Output Summary (Last 24 hours) at 06/12/2021 1128 Last data filed at 06/12/2021 0700 Gross per 24 hour  Intake 413.48 ml  Output 1351 ml  Net -937.52 ml   Filed Weights   06/10/21 0533 06/10/21 1441 06/11/21 0437  Weight: 83.5 kg 77.2 kg 75.8 kg    Examination:  General exam: Appears calm and comfortable  Respiratory system: Clear to auscultation. Respiratory effort normal. On [place] Cardiovascular system: S1 & S2 heard, RRR.  Gastrointestinal system: Abdomen is soft Central nervous  system: Alert and awake Extremities: No edema Skin: No significant lesions noted Psychiatry: Flat affect.    Data Reviewed: I have personally reviewed following labs and imaging studies  CBC: Recent Labs  Lab 06/10/21 0609 06/11/21 0404 06/12/21 0341  WBC 8.1 7.2 9.7  NEUTROABS 5.6  --   --   HGB 11.1* 9.3* 11.0*  HCT 38.5 31.7* 37.9  MCV 86.9 86.4 89.0  PLT 298 269 492   Basic Metabolic Panel: Recent Labs  Lab 06/10/21 0609 06/11/21 0404 06/12/21 0341  NA 144 142 146*  K 3.1* 2.9* 4.1  CL 105 106 110  CO2 '29 28 25  '$ GLUCOSE 100* 86 128*  BUN '15 10 12  '$ CREATININE 0.64 0.68 0.72  CALCIUM 8.8* 8.0* 8.6*  MG  --  1.3* 1.8   GFR: Estimated Creatinine Clearance: 63.6 mL/min (by C-G formula based on SCr of 0.72 mg/dL). Liver Function Tests: Recent Labs  Lab 06/10/21 0609  AST 16  ALT 17  ALKPHOS 148*  BILITOT 1.0  PROT 6.7  ALBUMIN 3.0*   No results for input(s): LIPASE, AMYLASE in the last 168 hours. No results for input(s): AMMONIA in the last 168 hours. Coagulation Profile: Recent Labs  Lab 06/10/21 0609  INR 1.3*   Cardiac Enzymes: Recent Labs  Lab 06/10/21 0745  CKTOTAL 18*   BNP (last 3 results) No results for input(s): PROBNP in the last 8760 hours. HbA1C: No results for input(s): HGBA1C in the last 72 hours. CBG: Recent Labs  Lab 06/11/21 1058 06/11/21 1605 06/11/21 2116 06/12/21 0718 06/12/21 1114  GLUCAP 126* 138* 89 125* 130*   Lipid Profile: No results for input(s): CHOL, HDL, LDLCALC, TRIG, CHOLHDL, LDLDIRECT in the last 72 hours. Thyroid Function Tests: Recent Labs    06/10/21 0745  TSH 2.266   Anemia Panel: No results for input(s): VITAMINB12, FOLATE, FERRITIN, TIBC, IRON, RETICCTPCT in the last 72 hours. Sepsis Labs: Recent Labs  Lab 06/10/21 0609  LATICACIDVEN 1.5    Recent Results (from the past 240 hour(s))  Resp Panel by RT-PCR (Flu A&B, Covid) Nasopharyngeal Swab     Status: None   Collection Time:  06/10/21  5:58 AM   Specimen: Nasopharyngeal Swab; Nasopharyngeal(NP) swabs in vial transport medium  Result Value Ref Range Status   SARS Coronavirus 2 by RT PCR NEGATIVE NEGATIVE Final    Comment: (NOTE) SARS-CoV-2 target nucleic acids are NOT DETECTED.  The SARS-CoV-2 RNA is generally detectable in upper respiratory specimens during the acute phase of infection. The lowest concentration of SARS-CoV-2 viral copies this assay can detect is 138 copies/mL. A negative result does not preclude SARS-Cov-2 infection and should not be used as the sole basis for treatment or other patient management decisions. A negative result may occur with  improper specimen collection/handling, submission of specimen other than nasopharyngeal swab, presence of viral mutation(s) within the areas targeted by this assay, and inadequate number of viral copies(<138 copies/mL). A negative result must be combined with clinical observations, patient history, and epidemiological information. The expected result is Negative.  Fact Sheet for Patients:  EntrepreneurPulse.com.au  Fact Sheet for Healthcare Providers:  IncredibleEmployment.be  This test is no t yet approved or cleared by the [place] FDA and  has been authorized for detection and/or diagnosis of SARS-CoV-2 by FDA under an Emergency Use Authorization (EUA). This EUA will remain  in effect (meaning this test can be used) for the duration of the COVID-19 declaration under Section 564(b)(1) of the Act, 21 U.S.C.section 360bbb-3(b)(1), unless the authorization is terminated  or revoked sooner.       Influenza A by PCR NEGATIVE NEGATIVE Final   Influenza B by PCR NEGATIVE NEGATIVE Final    Comment: (NOTE) The Xpert Xpress SARS-CoV-2/FLU/RSV plus assay is intended as an aid in the diagnosis of influenza from Nasopharyngeal swab specimens and should not be used as a sole basis for treatment. Nasal washings  and aspirates are unacceptable for Xpert Xpress SARS-CoV-2/FLU/RSV testing.  Fact Sheet for Patients: EntrepreneurPulse.com.au  Fact Sheet for Healthcare Providers: IncredibleEmployment.be  This test is not yet approved or cleared by the [place] FDA and has been authorized for detection and/or diagnosis of SARS-CoV-2 by FDA under an Emergency Use Authorization (EUA). This EUA will remain in effect (meaning this test can be used) for the duration of the COVID-19 declaration under Section 564(b)(1) of the Act, 21 U.S.C. section 360bbb-3(b)(1), unless the authorization is terminated or revoked.  Performed at [facility], [address] [place], [place] [address]   Blood Culture (routine x  2)     Status: None (Preliminary result)   Collection Time: 06/10/21  6:07 AM   Specimen: BLOOD RIGHT ARM  Result Value Ref Range Status   Specimen Description BLOOD RIGHT ARM  Final   Special Requests   Final    BOTTLES DRAWN AEROBIC AND ANAEROBIC Blood Culture results may not be optimal due to an excessive volume of blood received in culture bottles   Culture   Final    NO GROWTH 2 DAYS Performed at [facility], [address] [place], [place] [address]    Report Status PENDING  Incomplete  Blood Culture (routine x 2)     Status: None (Preliminary result)   Collection Time: 06/10/21  6:07 AM   Specimen: BLOOD LEFT WRIST  Result Value Ref Range Status   Specimen Description BLOOD LEFT WRIST  Final   Special Requests   Final    Blood Culture results may not be optimal due to an inadequate volume of blood received in culture bottles   Culture   Final    NO GROWTH 2 DAYS Performed at [facility], [address] [place], [place] [address]    Report Status PENDING  Incomplete  MRSA Next Gen by PCR, Nasal     Status: Abnormal   Collection Time: 06/10/21  2:27 PM   Specimen: Nasal Mucosa; Nasal Swab  Result Value Ref Range Status   MRSA by PCR Next  Gen DETECTED (A) NOT DETECTED Final    Comment: RESULT CALLED TO, READ BACK BY AND VERIFIED WITH: E. [facility] RN 1660 [phone] K FORSYTH (NOTE) The GeneXpert MRSA Assay (FDA approved for NASAL specimens only), is one component of a comprehensive MRSA colonization surveillance program. It is not intended to diagnose MRSA infection nor to guide or monitor treatment for MRSA infections. Test performance is not FDA approved in patients less than 80 years old. Performed at [facility], [address] [place],  [address]          Radiology Studies: No results found.      Scheduled Meds:  (feeding supplement) PROSource Plus  30 mL Oral q AM   vitamin C  500 mg Oral Daily   buPROPion ER  100 mg Oral BID   Chlorhexidine Gluconate Cloth  6 each Topical Q0600   cycloSPORINE  1 drop Both Eyes BID   diltiazem  90 mg Oral Q6H   furosemide  40 mg Intravenous Once   gabapentin  300 mg Oral TID   icosapent Ethyl  1 g Oral q morning   insulin aspart  0-15 Units Subcutaneous TID WC   insulin aspart  0-5 Units Subcutaneous QHS   ketotifen  1 drop Both Eyes BID   loratadine  10 mg Oral Daily   magnesium oxide  400 mg Oral Daily   metoprolol tartrate  100 mg Oral BID   mometasone-formoterol  2 puff Inhalation BID   multivitamin with minerals  1 tablet Oral Daily   multivitamin-lutein  1 capsule Oral Daily   pantoprazole  40 mg Oral Daily   polyvinyl alcohol  2 drop Both Eyes Daily   predniSONE  5 mg Oral Q breakfast   Rivaroxaban  20 mg Oral q1800   saccharomyces boulardii  250 mg Oral BID   senna  1 tablet Oral Daily   sodium chloride flush  3 mL Intravenous Q12H   zinc sulfate  220 mg Oral Daily   Continuous Infusions:  sodium chloride  diltiazem (CARDIZEM) infusion Stopped (06/11/21 1238)     LOS: 2 days    Time spent: 35 minutes    Tomasita Beevers D Manuella Ghazi, DO [facility]  If 7PM-7AM, please contact night-coverage [email or web address] 06/12/2021, 11:28 AM

## 2021-06-13 DIAGNOSIS — I4891 Unspecified atrial fibrillation: Secondary | ICD-10-CM | POA: Diagnosis not present

## 2021-06-13 LAB — BASIC METABOLIC PANEL
Anion gap: 10 (ref 5–15)
BUN: 12 mg/dL (ref 8–23)
CO2: 28 mmol/L (ref 22–32)
Calcium: 8.2 mg/dL — ABNORMAL LOW (ref 8.9–10.3)
Chloride: 104 mmol/L (ref 98–111)
Creatinine, Ser: 0.71 mg/dL (ref 0.44–1.00)
GFR, Estimated: >60 mL/min (ref >60)
Glucose, Bld: 104 mg/dL — ABNORMAL HIGH (ref 70–99)
Potassium: 3.7 mmol/L (ref 3.5–5.1)
Sodium: 142 mmol/L (ref 135–145)

## 2021-06-13 LAB — MAGNESIUM: Magnesium: 1.8 mg/dL (ref 1.7–2.4)

## 2021-06-13 LAB — GLUCOSE, CAPILLARY
Glucose-Capillary: 118 mg/dL — ABNORMAL HIGH (ref 70–99)
Glucose-Capillary: 126 mg/dL — ABNORMAL HIGH (ref 70–99)
Glucose-Capillary: 174 mg/dL — ABNORMAL HIGH (ref 70–99)
Glucose-Capillary: 131 mg/dL — ABNORMAL HIGH (ref 70–99)

## 2021-06-13 MED ORDER — MUPIROCIN 2 % EX OINT
1.0000 "application " | TOPICAL_OINTMENT | Freq: Two times a day (BID) | CUTANEOUS | Status: AC
  Administered 2021-06-13 – 2021-06-18 (×9): 1 via NASAL
  Filled 2021-06-13 (×3): qty 22

## 2021-06-13 MED ORDER — GUAIFENESIN-DM 100-10 MG/5ML PO SYRP
5.0000 mL | ORAL_SOLUTION | ORAL | Status: DC | PRN
  Administered 2021-06-13: 5 mL via ORAL
  Filled 2021-06-13 (×2): qty 5

## 2021-06-13 MED ORDER — ORAL CARE MOUTH RINSE
15.0000 mL | Freq: Two times a day (BID) | OROMUCOSAL | Status: DC
  Administered 2021-06-13 – 2021-06-21 (×12): 15 mL via OROMUCOSAL

## 2021-06-13 MED ORDER — DILTIAZEM HCL 60 MG PO TABS
90.0000 mg | ORAL_TABLET | Freq: Four times a day (QID) | ORAL | Status: AC
  Administered 2021-06-13 (×3): 90 mg via ORAL
  Filled 2021-06-13 (×3): qty 1

## 2021-06-13 NOTE — Plan of Care (Addendum)
Patient now off of Amio infusion. On 2L [place] supplemental 02. Remains in SDU. ? ?Edit @ 1951 hrs: Murpirocin ordered per "MRSA positive standing orders". Dr. Josephine Cables notified via secure chat. Murpirocin was not ordered previously this admission. CHG bats were ordered previously.  ? ?Edit @ 0630 hrs: patient adamant to ambulate to toilet this AM. Upon standing with a walker and this RN, the patient proceeded to lower herself to the floor and sit with assistance (did not fall). Patient was not able to ambulate with walker. Patient was unable to stand from the floor. This RN and 2 others assisted the patient back to bed with a lift.  ? ?Edit @ 0645 hrs: patient is experiencing chills new this AM, also RVR 130s. Dr. Josephine Cables notified via secure chat. Temp 36.3.  ? ?Edit @ 0700 hrs: Dr. Josephine Cables at bedside. Labs ordered.  ? ?Problem: Education: ?Goal: Knowledge of General Education information will improve ?Description: Including pain rating scale, medication(s)/side effects and non-pharmacologic comfort measures ?Outcome: Progressing ?  ?Problem: Health Behavior/Discharge Planning: ?Goal: Ability to manage health-related needs will improve ?Outcome: Progressing ?  ?Problem: Clinical Measurements: ?Goal: Ability to maintain clinical measurements within normal limits will improve ?Outcome: Progressing ?Goal: Will remain free from infection ?Outcome: Progressing ?Goal: Diagnostic test results will improve ?Outcome: Progressing ?Goal: Respiratory complications will improve ?Outcome: Progressing ?Goal: Cardiovascular complication will be avoided ?Outcome: Progressing ?  ?Problem: Activity: ?Goal: Risk for activity intolerance will decrease ?Outcome: Progressing ?  ?Problem: Nutrition: ?Goal: Adequate nutrition will be maintained ?Outcome: Progressing ?  ?Problem: Coping: ?Goal: Level of anxiety will decrease ?Outcome: Progressing ?  ?Problem: Elimination: ?Goal: Will not experience complications related to bowel  motility ?Outcome: Progressing ?Goal: Will not experience complications related to urinary retention ?Outcome: Progressing ?  ?Problem: Pain Managment: ?Goal: General experience of comfort will improve ?Outcome: Progressing ?  ?Problem: Safety: ?Goal: Ability to remain free from injury will improve ?Outcome: Progressing ?  ?Problem: Skin Integrity: ?Goal: Risk for impaired skin integrity will decrease ?Outcome: Progressing ?  ?

## 2021-06-13 NOTE — Progress Notes (Signed)
PROGRESS NOTE    [name]  MMN:[id number] DOB: 06/18/1945 DOA: 06/10/2021 PCP: Aletha Halim., PA-C   Brief Narrative:  Per HPI: [name] is a 76 y.o. female with medical history significant of paroxysmal atrial fibrillation on Xarelto for anticoagulation, chronic diastolic heart failure, dyslipidemia, hypertension, type 2 diabetes, prior CVA, peripheral vascular disease, rheumatoid arthritis, and chronic lower extremity wounds who presented to the ED after a fall at her [facility]F that was unwitnessed.  Upon arrival to the ED, she was found to be in atrial fibrillation with RVR and was also noted to be quite hypertensive.  She denies any chest pain, palpitations, or shortness of breath and complains of her left lower extremity erythema.  She appears to have been seen by wound care on 3/8 with debridements done at that time with no concern for cellulitis.   She was recently hospitalized for iliopsoas abscess that was drained and discharged on 2/21.  She has been completing her antibiotics at SNF.   In the ED, CT head was performed with no acute findings and she was noted to have atrial fibrillation with RVR with heart rate in the 150 bpm range.  She has been started on Cardizem drip for heart rate control.  06/11/21: Patient has been admitted for heart rate control with noted atrial fibrillation with RVR.  She continues to remain on Cardizem drip.  Cardiology has assessed patient with recommendations to change Cardizem dosing today.  Electrolyte abnormalities to be corrected.  06/12/21: Patient continues to have elevated heart rates requiring further adjustment of oral Cardizem dose.  Remains off Cardizem drip at this time.  We will also administer 1 dose of IV Lasix per cardiology recommendations on 3/10.  06/13/21: Heart rates are better controlled with increased dose of Cardizem to 90 mg every 6 hours.  Plan to administer 3 more doses and then anticipate cardiology follow-up in a.m. with  likely consolidation of dose versus increase after further evaluation.    Assessment & Plan:   Principal Problem:   Atrial fibrillation with RVR (HCC) Active Problems:   Hypertension   Type 2 diabetes mellitus with hyperlipidemia (HCC)   Rheumatoid arthritis (HCC)   Peripheral vascular disease (HCC)   Chronic diastolic (congestive) heart failure (HCC)   Iliopsoas abscess on left (HCC)   Hypokalemia  Assessment and Plan:   Atrial fibrillation with RVR (HCC) Cardizem drip discontinued, increase Cardizem oral dosing to 90 mg every 6 hours and follow; DC after midnight to allow for further dose adjustment/consolidation per Cardiology TSH within normal limits Recent 2D echocardiogram noted on 2/23 and will avoid for now IV metoprolol as needed for elevated heart rates Likely will require increasing doses of Cardizem upon discharge Continue anticoagulation with Xarelto   Hypokalemia Currently resolved   Iliopsoas abscess on left [facility]) Patient finishing up Duricef today   Chronic diastolic (congestive) heart failure ([place]) Okay to give 1 dose of IV Lasix today per cardiology recommendations 2D echocardiogram recently performed   Peripheral vascular disease ([place]) Noted to have wounds on lower extremities which is being followed by outpatient wound care 3/8   Rheumatoid arthritis ([place]) Continue prednisone   Type 2 diabetes mellitus with hyperlipidemia (HCC) SSI while inpatient Resume statin   Hypertension Noted to have elevated blood pressures on admission, monitor She had improved with increasing doses of Cardizem as well as dosing of Lasix       DVT prophylaxis: Xarelto Code Status: DNR Family Communication: Discussed with son on  phone 3/12 Disposition Plan:  Status is: Inpatient Remains inpatient appropriate because: Need for IV Cardizem drip for heart rate control.     Nutritional Assessment:   The patients BMI is: Body mass index is 26.17 kg/m.[name]   Seen by  dietician.  I agree with the assessment and plan as outlined below:   Nutrition Status: Skin Assessment:   I have examined the patients skin and I agree with the wound assessment as performed by the wound care RN as outlined below:   Pressure Injury 05/21/21 Buttocks Right Stage 2 -  Partial thickness loss of dermis presenting as a shallow open injury with a red, pink wound bed without slough. (Active)  05/21/21 1131  Location: Buttocks  Location Orientation: Right  Staging: Stage 2 -  Partial thickness loss of dermis presenting as a shallow open injury with a red, pink wound bed without slough.  Wound Description (Comments):   Present on Admission: Yes     Pressure Injury 05/21/21 Buttocks Right Stage 2 -  Partial thickness loss of dermis presenting as a shallow open injury with a red, pink wound bed without slough. (Active)  05/21/21 1132  Location: Buttocks  Location Orientation: Right  Staging: Stage 2 -  Partial thickness loss of dermis presenting as a shallow open injury with a red, pink wound bed without slough.  Wound Description (Comments):   Present on Admission: Yes     Pressure Injury 06/10/21 Heel Left Stage 2 -  Partial thickness loss of dermis presenting as a shallow open injury with a red, pink wound bed without slough. (Active)  06/10/21 1435  Location: Heel  Location Orientation: Left (Heel)  Staging: Stage 2 -  Partial thickness loss of dermis presenting as a shallow open injury with a red, pink wound bed without slough.  Wound Description (Comments):   Present on Admission: Yes      Consultants:  Cardiology   Procedures:  See below  Antimicrobials:  None   Subjective: Patient seen and evaluated today with no new acute complaints or concerns. No acute concerns or events noted overnight.  Heart rates have been better controlled overnight, but she still has episodes of elevated heart rate.  Objective: Vitals:   06/13/21 0400 06/13/21 0500 06/13/21 0600  06/13/21 0800  BP: 133/85 (!) 167/73 (!) 172/87   Pulse: 72 67 98 (!) 109  Resp: '15 16 18   '$ Temp: (!) 97.5 F (36.4 C)     TempSrc: Oral     SpO2: 97% 98% 97%   Weight:      Height:        Intake/Output Summary (Last 24 hours) at 06/13/2021 0841 Last data filed at 06/12/2021 1700 Gross per 24 hour  Intake --  Output 2350 ml  Net -2350 ml   Filed Weights   06/10/21 0533 06/10/21 1441 06/11/21 0437  Weight: 83.5 kg 77.2 kg 75.8 kg    Examination:  General exam: Appears calm and comfortable  Respiratory system: Clear to auscultation. Respiratory effort normal.  2 L nasal cannula Cardiovascular system: S1 & S2 heard, irregular and tachycardic Gastrointestinal system: Abdomen is soft Central nervous system: Alert and awake Extremities: No edema Skin: No significant lesions noted Psychiatry: Flat affect.    Data Reviewed: I have personally reviewed following labs and imaging studies  CBC: Recent Labs  Lab 06/10/21 0609 06/11/21 0404 06/12/21 0341  WBC 8.1 7.2 9.7  NEUTROABS 5.6  --   --   HGB 11.1* 9.3* 11.0*  HCT 38.5 31.7* 37.9  MCV 86.9 86.4 89.0  PLT 298 269 353   Basic Metabolic Panel: Recent Labs  Lab 06/10/21 0609 06/11/21 0404 06/12/21 0341 06/13/21 0415  NA 144 142 146* 142  K 3.1* 2.9* 4.1 3.7  CL 105 106 110 104  CO2 '29 28 25 28  '$ GLUCOSE 100* 86 128* 104*  BUN '15 10 12 12  '$ CREATININE 0.64 0.68 0.72 0.71  CALCIUM 8.8* 8.0* 8.6* 8.2*  MG  --  1.3* 1.8 1.8   GFR: Estimated Creatinine Clearance: 63.6 mL/min (by C-G formula based on SCr of 0.71 mg/dL). Liver Function Tests: Recent Labs  Lab 06/10/21 0609  AST 16  ALT 17  ALKPHOS 148*  BILITOT 1.0  PROT 6.7  ALBUMIN 3.0*   No results for input(s): LIPASE, AMYLASE in the last 168 hours. No results for input(s): AMMONIA in the last 168 hours. Coagulation Profile: Recent Labs  Lab 06/10/21 0609  INR 1.3*   Cardiac Enzymes: Recent Labs  Lab 06/10/21 0745  CKTOTAL 18*   BNP (last  3 results) No results for input(s): PROBNP in the last 8760 hours. HbA1C: No results for input(s): HGBA1C in the last 72 hours. CBG: Recent Labs  Lab 06/12/21 0718 06/12/21 1114 06/12/21 1605 06/12/21 2121 06/13/21 0740  GLUCAP 125* 130* 118* 93 118*   Lipid Profile: No results for input(s): CHOL, HDL, LDLCALC, TRIG, CHOLHDL, LDLDIRECT in the last 72 hours. Thyroid Function Tests: Recent Labs    06/10/21 0745  TSH 2.266   Anemia Panel: No results for input(s): VITAMINB12, FOLATE, FERRITIN, TIBC, IRON, RETICCTPCT in the last 72 hours. Sepsis Labs: Recent Labs  Lab 06/10/21 0609  LATICACIDVEN 1.5    Recent Results (from the past 240 hour(s))  Resp Panel by RT-PCR (Flu A&B, Covid) Nasopharyngeal Swab     Status: None   Collection Time: 06/10/21  5:58 AM   Specimen: Nasopharyngeal Swab; Nasopharyngeal(NP) swabs in vial transport medium  Result Value Ref Range Status   SARS Coronavirus 2 by RT PCR NEGATIVE NEGATIVE Final    Comment: (NOTE) SARS-CoV-2 target nucleic acids are NOT DETECTED.  The SARS-CoV-2 RNA is generally detectable in upper respiratory specimens during the acute phase of infection. The lowest concentration of SARS-CoV-2 viral copies this assay can detect is 138 copies/mL. A negative result does not preclude SARS-Cov-2 infection and should not be used as the sole basis for treatment or other patient management decisions. A negative result may occur with  improper specimen collection/handling, submission of specimen other than nasopharyngeal swab, presence of viral mutation(s) within the areas targeted by this assay, and inadequate number of viral copies(<138 copies/mL). A negative result must be combined with clinical observations, patient history, and epidemiological information. The expected result is Negative.  Fact Sheet for Patients:  EntrepreneurPulse.com.au  Fact Sheet for Healthcare Providers:   IncredibleEmployment.be  This test is no t yet approved or cleared by the [place] FDA and  has been authorized for detection and/or diagnosis of SARS-CoV-2 by FDA under an Emergency Use Authorization (EUA). This EUA will remain  in effect (meaning this test can be used) for the duration of the COVID-19 declaration under Section 564(b)(1) of the Act, 21 U.S.C.section 360bbb-3(b)(1), unless the authorization is terminated  or revoked sooner.       Influenza A by PCR NEGATIVE NEGATIVE Final   Influenza B by PCR NEGATIVE NEGATIVE Final    Comment: (NOTE) The Xpert Xpress SARS-CoV-2/FLU/RSV plus assay is intended as an aid in  the diagnosis of influenza from Nasopharyngeal swab specimens and should not be used as a sole basis for treatment. Nasal washings and aspirates are unacceptable for Xpert Xpress SARS-CoV-2/FLU/RSV testing.  Fact Sheet for Patients: EntrepreneurPulse.com.au  Fact Sheet for Healthcare Providers: IncredibleEmployment.be  This test is not yet approved or cleared by the [place] FDA and has been authorized for detection and/or diagnosis of SARS-CoV-2 by FDA under an Emergency Use Authorization (EUA). This EUA will remain in effect (meaning this test can be used) for the duration of the COVID-19 declaration under Section 564(b)(1) of the Act, 21 U.S.C. section 360bbb-3(b)(1), unless the authorization is terminated or revoked.  Performed at [facility], [address], [place], [place] [address]   Blood Culture (routine x 2)     Status: None (Preliminary result)   Collection Time: 06/10/21  6:07 AM   Specimen: BLOOD RIGHT ARM  Result Value Ref Range Status   Specimen Description BLOOD RIGHT ARM  Final   Special Requests   Final    BOTTLES DRAWN AEROBIC AND ANAEROBIC Blood Culture results may not be optimal due to an excessive volume of blood received in culture bottles   Culture   Final    NO  GROWTH 3 DAYS Performed at [facility], [address] [place], [place] [address]    Report Status PENDING  Incomplete  Blood Culture (routine x 2)     Status: None (Preliminary result)   Collection Time: 06/10/21  6:07 AM   Specimen: BLOOD LEFT WRIST  Result Value Ref Range Status   Specimen Description BLOOD LEFT WRIST  Final   Special Requests   Final    Blood Culture results may not be optimal due to an inadequate volume of blood received in culture bottles   Culture   Final    NO GROWTH 3 DAYS Performed at [facility], [address] [place], [place] [address]    Report Status PENDING  Incomplete  MRSA Next Gen by PCR, Nasal     Status: Abnormal   Collection Time: 06/10/21  2:27 PM   Specimen: Nasal Mucosa; Nasal Swab  Result Value Ref Range Status   MRSA by PCR Next Gen DETECTED (A) NOT DETECTED Final    Comment: RESULT CALLED TO, READ BACK BY AND VERIFIED WITH: E. [facility] RN 2409 [phone] K FORSYTH (NOTE) The GeneXpert MRSA Assay (FDA approved for NASAL specimens only), is one component of a comprehensive MRSA colonization surveillance program. It is not intended to diagnose MRSA infection nor to guide or monitor treatment for MRSA infections. Test performance is not FDA approved in patients less than 20 years old. Performed at [facility], [address], [place], [place] [address]          Radiology Studies: No results found.      Scheduled Meds:  (feeding supplement) PROSource Plus  30 mL Oral q AM   vitamin C  500 mg Oral Daily   atorvastatin  20 mg Oral Daily   buPROPion ER  100 mg Oral BID   Chlorhexidine Gluconate Cloth  6 each Topical Q0600   cycloSPORINE  1 drop Both Eyes BID   diltiazem  90 mg Oral Q6H   gabapentin  300 mg Oral TID   icosapent Ethyl  1 g Oral q morning   insulin aspart  0-15 Units Subcutaneous TID WC   insulin aspart  0-5 Units Subcutaneous QHS   ketotifen  1 drop Both Eyes BID   loratadine  10  mg Oral Daily   magnesium  oxide  400 mg Oral Daily   metoprolol tartrate  100 mg Oral BID   mometasone-formoterol  2 puff Inhalation BID   multivitamin with minerals  1 tablet Oral Daily   multivitamin-lutein  1 capsule Oral Daily   pantoprazole  40 mg Oral Daily   polyvinyl alcohol  2 drop Both Eyes Daily   predniSONE  5 mg Oral Q breakfast   Rivaroxaban  20 mg Oral q1800   saccharomyces boulardii  250 mg Oral BID   senna  1 tablet Oral Daily   sodium chloride flush  3 mL Intravenous Q12H   zinc sulfate  220 mg Oral Daily   Continuous Infusions:  sodium chloride     diltiazem (CARDIZEM) infusion Stopped (06/11/21 1238)     LOS: 3 days    Time spent: 35 minutes    Christy Ehrsam Darleen Crocker, DO [facility]  If 7PM-7AM, please contact night-coverage [email or web address] 06/13/2021, 8:41 AM

## 2021-06-14 ENCOUNTER — Inpatient Hospital Stay (HOSPITAL_COMMUNITY): Payer: 59

## 2021-06-14 DIAGNOSIS — I10 Essential (primary) hypertension: Secondary | ICD-10-CM | POA: Diagnosis not present

## 2021-06-14 DIAGNOSIS — T68XXXA Hypothermia, initial encounter: Secondary | ICD-10-CM

## 2021-06-14 DIAGNOSIS — I4891 Unspecified atrial fibrillation: Secondary | ICD-10-CM | POA: Diagnosis not present

## 2021-06-14 DIAGNOSIS — I5032 Chronic diastolic (congestive) heart failure: Secondary | ICD-10-CM | POA: Diagnosis not present

## 2021-06-14 LAB — BASIC METABOLIC PANEL
Anion gap: 10 (ref 5–15)
BUN: 17 mg/dL (ref 8–23)
CO2: 27 mmol/L (ref 22–32)
Calcium: 8.4 mg/dL — ABNORMAL LOW (ref 8.9–10.3)
Chloride: 102 mmol/L (ref 98–111)
Creatinine, Ser: 0.94 mg/dL (ref 0.44–1.00)
GFR, Estimated: >60 mL/min (ref >60)
Glucose, Bld: 127 mg/dL — ABNORMAL HIGH (ref 70–99)
Potassium: 3.9 mmol/L (ref 3.5–5.1)
Sodium: 139 mmol/L (ref 135–145)

## 2021-06-14 LAB — CBC
HCT: 37 % (ref 36.0–46.0)
Hemoglobin: 10.8 g/dL — ABNORMAL LOW (ref 12.0–15.0)
MCH: 26.3 pg (ref 26.0–34.0)
MCHC: 29.2 g/dL — ABNORMAL LOW (ref 30.0–36.0)
MCV: 90 fL (ref 80.0–100.0)
Platelets: 272 10*3/uL (ref 150–400)
RBC: 4.11 MIL/uL (ref 3.87–5.11)
RDW: 20.3 % — ABNORMAL HIGH (ref 11.5–15.5)
WBC: 9.5 10*3/uL (ref 4.0–10.5)
nRBC: 0 % (ref 0.0–0.2)

## 2021-06-14 LAB — GLUCOSE, CAPILLARY
Glucose-Capillary: 142 mg/dL — ABNORMAL HIGH (ref 70–99)
Glucose-Capillary: 82 mg/dL (ref 70–99)
Glucose-Capillary: 118 mg/dL — ABNORMAL HIGH (ref 70–99)
Glucose-Capillary: 97 mg/dL (ref 70–99)

## 2021-06-14 LAB — LACTIC ACID, PLASMA
Lactic Acid, Venous: 2.6 mmol/L (ref 0.5–1.9)
Lactic Acid, Venous: 1.7 mmol/L (ref 0.5–1.9)

## 2021-06-14 LAB — URINALYSIS, ROUTINE W REFLEX MICROSCOPIC
Bilirubin Urine: NEGATIVE
Glucose, UA: NEGATIVE mg/dL
Ketones, ur: NEGATIVE mg/dL
Nitrite: NEGATIVE
Protein, ur: >=300 mg/dL — AB
Specific Gravity, Urine: 1.02 (ref 1.005–1.030)
pH: 7 (ref 5.0–8.0)

## 2021-06-14 LAB — MAGNESIUM: Magnesium: 1.7 mg/dL (ref 1.7–2.4)

## 2021-06-14 LAB — PROCALCITONIN: Procalcitonin: <0.1 ng/mL

## 2021-06-14 MED ORDER — DIGOXIN 0.25 MG/ML IJ SOLN
0.2500 mg | Freq: Once | INTRAMUSCULAR | Status: AC
  Administered 2021-06-14: 0.25 mg via INTRAVENOUS
  Filled 2021-06-14: qty 2

## 2021-06-14 MED ORDER — LACTATED RINGERS IV SOLN: INTRAVENOUS | Status: DC

## 2021-06-14 MED ORDER — DILTIAZEM LOAD VIA INFUSION
10.0000 mg | Freq: Once | INTRAVENOUS | Status: AC
  Administered 2021-06-14: 10 mg via INTRAVENOUS
  Filled 2021-06-14: qty 10

## 2021-06-14 MED ORDER — SODIUM CHLORIDE 0.9 % IV SOLN
2.0000 g | INTRAVENOUS | Status: DC
  Administered 2021-06-14 – 2021-06-16 (×3): 2 g via INTRAVENOUS
  Filled 2021-06-14 (×3): qty 20

## 2021-06-14 MED ORDER — DIGOXIN 0.25 MG/ML IJ SOLN
0.1250 mg | Freq: Once | INTRAMUSCULAR | Status: AC
  Administered 2021-06-14: 0.125 mg via INTRAVENOUS
  Filled 2021-06-14: qty 2

## 2021-06-14 MED ORDER — MAGNESIUM SULFATE 2 GM/50ML IV SOLN
2.0000 g | Freq: Once | INTRAVENOUS | Status: AC
  Administered 2021-06-14: 2 g via INTRAVENOUS
  Filled 2021-06-14: qty 50

## 2021-06-14 MED ORDER — MUSCLE RUB 10-15 % EX CREA
TOPICAL_CREAM | CUTANEOUS | Status: DC | PRN
  Filled 2021-06-14 (×3): qty 85

## 2021-06-14 MED ORDER — DILTIAZEM HCL-DEXTROSE 125-5 MG/125ML-% IV SOLN (PREMIX)
5.0000 mg/h | INTRAVENOUS | Status: DC
  Administered 2021-06-14 (×2): 5 mg/h via INTRAVENOUS
  Administered 2021-06-15: 10 mg/h via INTRAVENOUS
  Administered 2021-06-16: 5 mg/h via INTRAVENOUS
  Filled 2021-06-14 (×4): qty 125

## 2021-06-14 NOTE — Progress Notes (Signed)
PROGRESS NOTE    [name]  KNL:[id number] DOB: 07/12/1945 DOA: 06/10/2021 PCP: Aletha Halim., PA-C   Brief Narrative:  Per HPI: [name] is a 76 y.o. female with medical history significant of paroxysmal atrial fibrillation on Xarelto for anticoagulation, chronic diastolic heart failure, dyslipidemia, hypertension, type 2 diabetes, prior CVA, peripheral vascular disease, rheumatoid arthritis, and chronic lower extremity wounds who presented to the ED after a fall at her SNF that was unwitnessed.  Upon arrival to the ED, she was found to be in atrial fibrillation with RVR and was also noted to be quite hypertensive.  She denies any chest pain, palpitations, or shortness of breath and complains of her left lower extremity erythema.  She appears to have been seen by wound care on 3/8 with debridements done at that time with no concern for cellulitis.   She was recently hospitalized for iliopsoas abscess that was drained and discharged on 2/21.  She has been completing her antibiotics at SNF.   In the ED, CT head was performed with no acute findings and she was noted to have atrial fibrillation with RVR with heart rate in the 150 bpm range.  She has been started on Cardizem drip for heart rate control.  06/11/21: Patient has been admitted for heart rate control with noted atrial fibrillation with RVR.  She continues to remain on Cardizem drip.  Cardiology has assessed patient with recommendations to change Cardizem dosing today.  Electrolyte abnormalities to be corrected.  06/12/21: Patient continues to have elevated heart rates requiring further adjustment of oral Cardizem dose.  Remains off Cardizem drip at this time.  We will also administer 1 dose of IV Lasix per cardiology recommendations on 3/10.  06/13/21: Heart rates are better controlled with increased dose of Cardizem to 90 mg every 6 hours.  Plan to administer 3 more doses and then anticipate cardiology follow-up in a.m. with  likely consolidation of dose versus increase after further evaluation.  06/14/21: Heart rates demonstrate worsening control and will require reinitiation of Cardizem drip.  Cardiology following with plans to load digoxin today.  Initially noted to have increased lactic acid levels which are now downtrending.  Evaluating for source of infection.    Assessment & Plan:   Principal Problem:   Atrial fibrillation with RVR (HCC) Active Problems:   Hypertension   Type 2 diabetes mellitus with hyperlipidemia (HCC)   Rheumatoid arthritis (HCC)   Peripheral vascular disease (HCC)   Chronic diastolic (congestive) heart failure (HCC)   Iliopsoas abscess on left (HCC)   Hypokalemia  Assessment and Plan: Atrial fibrillation with RVR (HCC) Cardizem drip restarted this a.m. due to elevated heart rates TSH within normal limits Recent 2D echocardiogram noted on 2/23 and will avoid for now IV metoprolol as needed for elevated heart rates Likely will require increasing doses of Cardizem upon discharge Continue anticoagulation with Xarelto Appreciate cardiology recommendations with digoxin loading today   Iliopsoas abscess on left [facility]) Patient finishing up Duricef today   Chronic diastolic (congestive) heart failure (HCC) Hold further doses of Lasix for now Chest x-ray with bilateral effusions noted 2D echocardiogram recently performed   Peripheral vascular disease ([place]) Noted to have wounds on lower extremities which is being followed by outpatient wound care 3/8   Rheumatoid arthritis ([place]) Continue prednisone   Type 2 diabetes mellitus with hyperlipidemia ([place]) SSI while inpatient Resume statin   Hypertension Noted to have elevated blood pressures on admission, monitor She had improved with increasing  doses of Cardizem as well as dosing of Lasix   Lactic acidosis Currently improved Maintain on IV fluid and monitor No overt signs of infection currently noted with low  procalcitonin Avoid antibiotics for now   DVT prophylaxis: Xarelto Code Status: DNR Family Communication: Discussed with son on phone 3/12, tried calling 3/13 Disposition Plan:  Status is: Inpatient Remains inpatient appropriate because: Need for IV Cardizem drip for heart rate control.     Nutritional Assessment:   The patients BMI is: Body mass index is 26.17 kg/m.[name]   Seen by dietician.  I agree with the assessment and plan as outlined below:   Nutrition Status: Skin Assessment:   I have examined the patients skin and I agree with the wound assessment as performed by the wound care RN as outlined below:   Pressure Injury 05/21/21 Buttocks Right Stage 2 -  Partial thickness loss of dermis presenting as a shallow open injury with a red, pink wound bed without slough. (Active)  05/21/21 1131  Location: Buttocks  Location Orientation: Right  Staging: Stage 2 -  Partial thickness loss of dermis presenting as a shallow open injury with a red, pink wound bed without slough.  Wound Description (Comments):   Present on Admission: Yes     Pressure Injury 05/21/21 Buttocks Right Stage 2 -  Partial thickness loss of dermis presenting as a shallow open injury with a red, pink wound bed without slough. (Active)  05/21/21 1132  Location: Buttocks  Location Orientation: Right  Staging: Stage 2 -  Partial thickness loss of dermis presenting as a shallow open injury with a red, pink wound bed without slough.  Wound Description (Comments):   Present on Admission: Yes     Pressure Injury 06/10/21 Heel Left Stage 2 -  Partial thickness loss of dermis presenting as a shallow open injury with a red, pink wound bed without slough. (Active)  06/10/21 1435  Location: Heel  Location Orientation: Left (Heel)  Staging: Stage 2 -  Partial thickness loss of dermis presenting as a shallow open injury with a red, pink wound bed without slough.  Wound Description (Comments):   Present on Admission:  Yes      Consultants:  Cardiology   Procedures:  See below   Antimicrobials:  None   Subjective: Patient seen and evaluated today with elevated heart rates noted overnight into this morning.  She was noted to have a fever as well as elevated lactic acid levels.  Objective: Vitals:   06/14/21 1000 06/14/21 1030 06/14/21 1050 06/14/21 1100  BP: (!) 87/59 (!) 111/47 (!) 106/58 107/64  Pulse: 100 (!) 112 (!) 105 (!) 120  Resp: (!) 24 (!) 22 (!) 21 20  Temp:    (!) 101.8 F (38.8 C)  TempSrc:    Axillary  SpO2: 93% 93% 95% 94%  Weight:      Height:        Intake/Output Summary (Last 24 hours) at 06/14/2021 1114 Last data filed at 06/14/2021 1047 Gross per 24 hour  Intake 77.6 ml  Output 50 ml  Net 27.6 ml   Filed Weights   06/10/21 0533 06/10/21 1441 06/11/21 0437  Weight: 83.5 kg 77.2 kg 75.8 kg    Examination:  General exam: Appears somnolent, but arousable Respiratory system: Clear to auscultation. Respiratory effort normal.  On nasal cannula oxygen Cardiovascular system: S1 & S2 heard, irregular and tachycardic Gastrointestinal system: Abdomen is soft Central nervous system: Somnolent Extremities: No edema, wounds clean dry and  intact Skin: No significant lesions noted Psychiatry: Flat affect.    Data Reviewed: I have personally reviewed following labs and imaging studies  CBC: Recent Labs  Lab 06/10/21 0609 06/11/21 0404 06/12/21 0341 06/14/21 0423  WBC 8.1 7.2 9.7 9.5  NEUTROABS 5.6  --   --   --   HGB 11.1* 9.3* 11.0* 10.8*  HCT 38.5 31.7* 37.9 37.0  MCV 86.9 86.4 89.0 90.0  PLT 298 269 283 211   Basic Metabolic Panel: Recent Labs  Lab 06/10/21 0609 06/11/21 0404 06/12/21 0341 06/13/21 0415 06/14/21 0423  NA 144 142 146* 142 139  K 3.1* 2.9* 4.1 3.7 3.9  CL 105 106 110 104 102  CO2 '29 28 25 28 27  '$ GLUCOSE 100* 86 128* 104* 127*  BUN '15 10 12 12 17  '$ CREATININE 0.64 0.68 0.72 0.71 0.94  CALCIUM 8.8* 8.0* 8.6* 8.2* 8.4*  MG  --  1.3*  1.8 1.8 1.7   GFR: Estimated Creatinine Clearance: 54.1 mL/min (by C-G formula based on SCr of 0.94 mg/dL). Liver Function Tests: Recent Labs  Lab 06/10/21 0609  AST 16  ALT 17  ALKPHOS 148*  BILITOT 1.0  PROT 6.7  ALBUMIN 3.0*   No results for input(s): LIPASE, AMYLASE in the last 168 hours. No results for input(s): AMMONIA in the last 168 hours. Coagulation Profile: Recent Labs  Lab 06/10/21 0609  INR 1.3*   Cardiac Enzymes: Recent Labs  Lab 06/10/21 0745  CKTOTAL 18*   BNP (last 3 results) No results for input(s): PROBNP in the last 8760 hours. HbA1C: No results for input(s): HGBA1C in the last 72 hours. CBG: Recent Labs  Lab 06/13/21 0740 06/13/21 1207 06/13/21 1602 06/13/21 2123 06/14/21 0731  GLUCAP 118* 126* 174* 131* 142*   Lipid Profile: No results for input(s): CHOL, HDL, LDLCALC, TRIG, CHOLHDL, LDLDIRECT in the last 72 hours. Thyroid Function Tests: No results for input(s): TSH, T4TOTAL, FREET4, T3FREE, THYROIDAB in the last 72 hours. Anemia Panel: No results for input(s): VITAMINB12, FOLATE, FERRITIN, TIBC, IRON, RETICCTPCT in the last 72 hours. Sepsis Labs: Recent Labs  Lab 06/10/21 0609 06/14/21 0423 06/14/21 0749 06/14/21 1012  PROCALCITON  --  <0.10  --   --   LATICACIDVEN 1.5  --  2.6* 1.7    Recent Results (from the past 240 hour(s))  Resp Panel by RT-PCR (Flu A&B, Covid) Nasopharyngeal Swab     Status: None   Collection Time: 06/10/21  5:58 AM   Specimen: Nasopharyngeal Swab; Nasopharyngeal(NP) swabs in vial transport medium  Result Value Ref Range Status   SARS Coronavirus 2 by RT PCR NEGATIVE NEGATIVE Final    Comment: (NOTE) SARS-CoV-2 target nucleic acids are NOT DETECTED.  The SARS-CoV-2 RNA is generally detectable in upper respiratory specimens during the acute phase of infection. The lowest concentration of SARS-CoV-2 viral copies this assay can detect is 138 copies/mL. A negative result does not preclude  SARS-Cov-2 infection and should not be used as the sole basis for treatment or other patient management decisions. A negative result may occur with  improper specimen collection/handling, submission of specimen other than nasopharyngeal swab, presence of viral mutation(s) within the areas targeted by this assay, and inadequate number of viral copies(<138 copies/mL). A negative result must be combined with clinical observations, patient history, and epidemiological information. The expected result is Negative.  Fact Sheet for Patients:  EntrepreneurPulse.com.au  Fact Sheet for Healthcare Providers:  IncredibleEmployment.be  This test is no t yet approved or cleared  by the [place] and  has been authorized for detection and/or diagnosis of SARS-CoV-2 by FDA under an Emergency Use Authorization (EUA). This EUA will remain  in effect (meaning this test can be used) for the duration of the COVID-19 declaration under Section 564(b)(1) of the Act, 21 U.S.C.section 360bbb-3(b)(1), unless the authorization is terminated  or revoked sooner.       Influenza A by PCR NEGATIVE NEGATIVE Final   Influenza B by PCR NEGATIVE NEGATIVE Final    Comment: (NOTE) The Xpert Xpress SARS-CoV-2/FLU/RSV plus assay is intended as an aid in the diagnosis of influenza from Nasopharyngeal swab specimens and should not be used as a sole basis for treatment. Nasal washings and aspirates are unacceptable for Xpert Xpress SARS-CoV-2/FLU/RSV testing.  Fact Sheet for Patients: EntrepreneurPulse.com.au  Fact Sheet for Healthcare Providers: IncredibleEmployment.be  This test is not yet approved or cleared by the [place] FDA and has been authorized for detection and/or diagnosis of SARS-CoV-2 by FDA under an Emergency Use Authorization (EUA). This EUA will remain in effect (meaning this test can be used) for the duration of  the COVID-19 declaration under Section 564(b)(1) of the Act, 21 U.S.C. section 360bbb-3(b)(1), unless the authorization is terminated or revoked.  Performed at [facility], [address], [place], [place] [address]   Blood Culture (routine x 2)     Status: None (Preliminary result)   Collection Time: 06/10/21  6:07 AM   Specimen: BLOOD RIGHT ARM  Result Value Ref Range Status   Specimen Description BLOOD RIGHT ARM  Final   Special Requests   Final    BOTTLES DRAWN AEROBIC AND ANAEROBIC Blood Culture results may not be optimal due to an excessive volume of blood received in culture bottles   Culture   Final    NO GROWTH 4 DAYS Performed at [facility], [address], [place], [place] [address]    Report Status PENDING  Incomplete  Blood Culture (routine x 2)     Status: None (Preliminary result)   Collection Time: 06/10/21  6:07 AM   Specimen: BLOOD LEFT WRIST  Result Value Ref Range Status   Specimen Description BLOOD LEFT WRIST  Final   Special Requests   Final    Blood Culture results may not be optimal due to an inadequate volume of blood received in culture bottles   Culture   Final    NO GROWTH 4 DAYS Performed at [facility], [address] [place], [place] [address]    Report Status PENDING  Incomplete  MRSA Next Gen by PCR, Nasal     Status: Abnormal   Collection Time: 06/10/21  2:27 PM   Specimen: Nasal Mucosa; Nasal Swab  Result Value Ref Range Status   MRSA by PCR Next Gen DETECTED (A) NOT DETECTED Final    Comment: RESULT CALLED TO, READ BACK BY AND VERIFIED WITH: E. [facility] RN [phone] K FORSYTH (NOTE) The GeneXpert MRSA Assay (FDA approved for NASAL specimens only), is one component of a comprehensive MRSA colonization surveillance program. It is not intended to diagnose MRSA infection nor to guide or monitor treatment for MRSA infections. Test performance is not FDA approved in patients less than 45 years old. Performed at [facility], [address] [place], [place] [address]   Culture, blood (routine x 2)     Status: None (Preliminary result)   Collection Time: 06/14/21  7:50 AM   Specimen: Left Antecubital; Blood  Result Value Ref Range  Status   Specimen Description LEFT ANTECUBITAL  Final   Special Requests   Final    BOTTLES DRAWN AEROBIC AND ANAEROBIC Blood Culture adequate volume   Culture   Final    NO GROWTH < 12 HOURS Performed at [facility], [address] [place], [place] [address]    Report Status PENDING  Incomplete  Culture, blood (routine x 2)     Status: None (Preliminary result)   Collection Time: 06/14/21  8:19 AM   Specimen: BLOOD LEFT HAND  Result Value Ref Range Status   Specimen Description BLOOD LEFT HAND  Final   Special Requests   Final    BOTTLES DRAWN AEROBIC AND ANAEROBIC Blood Culture adequate volume   Culture   Final    NO GROWTH < 12 HOURS Performed at [facility], [address] [place], [place] [address]    Report Status PENDING  Incomplete         Radiology Studies: No results found.      Scheduled Meds:  (feeding supplement) PROSource Plus  30 mL Oral q AM   vitamin C  500 mg Oral Daily   atorvastatin  20 mg Oral Daily   buPROPion ER  100 mg Oral BID   Chlorhexidine Gluconate Cloth  6 each Topical Q0600   cycloSPORINE  1 drop Both Eyes BID   digoxin  0.125 mg Intravenous Once   digoxin  0.125 mg Intravenous Once   gabapentin  300 mg Oral TID   icosapent Ethyl  1 g Oral q morning   insulin aspart  0-15 Units Subcutaneous TID WC   insulin aspart  0-5 Units Subcutaneous QHS   ketotifen  1 drop Both Eyes BID   loratadine  10 mg Oral Daily   magnesium oxide  400 mg Oral Daily   mouth rinse  15 mL Mouth Rinse BID   metoprolol tartrate  100 mg Oral BID   mometasone-formoterol  2 puff Inhalation BID   multivitamin with minerals  1 tablet Oral Daily   multivitamin-lutein  1 capsule Oral Daily   mupirocin ointment  1 application. Nasal BID   pantoprazole  40 mg Oral  Daily   polyvinyl alcohol  2 drop Both Eyes Daily   predniSONE  5 mg Oral Q breakfast   Rivaroxaban  20 mg Oral q1800   saccharomyces boulardii  250 mg Oral BID   senna  1 tablet Oral Daily   sodium chloride flush  3 mL Intravenous Q12H   zinc sulfate  220 mg Oral Daily   Continuous Infusions:  sodium chloride     diltiazem (CARDIZEM) infusion 10 mg/hr (06/14/21 1047)   lactated ringers 75 mL/hr at 06/14/21 1057     LOS: 4 days    Time spent: 35 minutes    Cruzito Standre D Manuella Ghazi, DO [facility]  If 7PM-7AM, please contact night-coverage [email or web address] 06/14/2021, 11:14 AM

## 2021-06-14 NOTE — Plan of Care (Signed)

## 2021-06-14 NOTE — Progress Notes (Signed)
RN called due to patient having A-fib with RVR in the 130s and temperature of 36.3C, at bedside, patient does not appear to be in acute distress.  Labs reviewed and CBC was ordered, magnesium was replenished.  Patient was tachypneic and tachycardic.  Metoprolol was already given to patient per RN. ?Work-up to rule out possible infectious process was initiated: Lactic acid, procalcitonin, blood culture ordered.   ?Bair hugger will be discontinued due to improved temperature, consider warm blanket if still needed.  EKG will be ordered ? ?Total time:  12 minutes ?This includes time reviewing the chart including progress notes, labs, EKGs, taking medical decisions, ordering labs and documenting findings. ? ?

## 2021-06-14 NOTE — Progress Notes (Signed)
CRITICAL VALUE STICKER ? ?CRITICAL VALUE: Lactic acid 2.6  ? ?RECEIVER (on-site recipient of call):  ? ?DATE & TIME NOTIFIED: Lillie Fragmin RN   ? ?MESSENGER (representative from lab): C. Festerman ? ?MD NOTIFIED: Dr. Manuella Ghazi  ? ?TIME OF NOTIFICATION: [id number] ? ?RESPONSE: No new orders  ?

## 2021-06-14 NOTE — Progress Notes (Signed)
Patient is not following commands. Unable to safely demonstrate ability to swallow medications. Coughing with sips of water. AM oral medications not given due to risk for aspiration.  ?Dr. Manuella Ghazi notified of no oral AM medications given due to aspiration risk.  ?

## 2021-06-14 NOTE — Progress Notes (Addendum)
Attempted x2 RN's to obtain urine specimen from in and out cath.  ?Unable due to anatomy to successfully catheterize for urine sample.  ?Placed on cooling blanket for temperature. Tylenol suppository given.  ? ?Dr. Manuella Ghazi notified that nursing staff unable to obtain specimen for urine sample.  ?

## 2021-06-14 NOTE — Progress Notes (Signed)
Cooling blanket removed at this time as patient is no longer febrile.  ?

## 2021-06-14 NOTE — Progress Notes (Signed)
Patient no longer confused and unable to follow commands.  ?Able to eat dinner tray with assistance. She is now redirectable to pulling at IV lines.  ?She was able to take oral evening medications. Will continue to monitor.  ?

## 2021-06-14 NOTE — Progress Notes (Signed)
? ?Progress Note ? ?Patient Name: [name] ?Date of Encounter: 06/14/2021 ? ?[facility] Cardiologist: Kirk Ruths, MD  ? ?Subjective  ? ?Patient with fever overnight and this AM (101 currently off bair hugger). Somnolent on exam currently but arousable to voice. Lactate 2.4. ? ?Inpatient Medications  ?  ?Scheduled Meds: ? (feeding supplement) PROSource Plus  30 mL Oral q AM  ? vitamin C  500 mg Oral Daily  ? atorvastatin  20 mg Oral Daily  ? buPROPion ER  100 mg Oral BID  ? Chlorhexidine Gluconate Cloth  6 each Topical Q0600  ? cycloSPORINE  1 drop Both Eyes BID  ? gabapentin  300 mg Oral TID  ? icosapent Ethyl  1 g Oral q morning  ? insulin aspart  0-15 Units Subcutaneous TID WC  ? insulin aspart  0-5 Units Subcutaneous QHS  ? ketotifen  1 drop Both Eyes BID  ? loratadine  10 mg Oral Daily  ? magnesium oxide  400 mg Oral Daily  ? mouth rinse  15 mL Mouth Rinse BID  ? metoprolol tartrate  100 mg Oral BID  ? mometasone-formoterol  2 puff Inhalation BID  ? multivitamin with minerals  1 tablet Oral Daily  ? multivitamin-lutein  1 capsule Oral Daily  ? mupirocin ointment  1 application. Nasal BID  ? pantoprazole  40 mg Oral Daily  ? polyvinyl alcohol  2 drop Both Eyes Daily  ? predniSONE  5 mg Oral Q breakfast  ? Rivaroxaban  20 mg Oral q1800  ? saccharomyces boulardii  250 mg Oral BID  ? senna  1 tablet Oral Daily  ? sodium chloride flush  3 mL Intravenous Q12H  ? zinc sulfate  220 mg Oral Daily  ? ?Continuous Infusions: ? sodium chloride    ? diltiazem (CARDIZEM) infusion 5 mg/hr (06/14/21 0803)  ? magnesium sulfate bolus IVPB 2 g (06/14/21 0815)  ? ?PRN Meds: ?sodium chloride, acetaminophen **OR** acetaminophen, albuterol, diclofenac Sodium, guaiFENesin-dextromethorphan, metoprolol tartrate, nitroGLYCERIN, ondansetron **OR** ondansetron (ZOFRAN) IV, oxyCODONE, polyethylene glycol, sodium chloride flush  ? ?Vital Signs  ?  ?Vitals:  ? 06/14/21 0700 06/14/21 0733 06/14/21 0807 06/14/21 0853  ?BP:  (!)  124/102 (!) 172/91 102/70  ?Pulse: 89 74 75 73  ?Resp: (!) 30 19 (!) 35 20  ?Temp:  (!) 100.6 ?F (38.1 ?C)    ?TempSrc:  Axillary    ?SpO2: 90% 95% 90% 94%  ?Weight:      ?Height:      ? ? ?Intake/Output Summary (Last 24 hours) at 06/14/2021 0856 ?Last data filed at 06/14/2021 0500 ?Gross per 24 hour  ?Intake --  ?Output 50 ml  ?Net -50 ml  ? ?Last 3 Weights 06/11/2021 06/10/2021 06/10/2021  ?Weight (lbs) 167 lb 1.7 oz 170 lb 3.1 oz 184 lb 1.4 oz  ?Weight (kg) 75.8 kg 77.2 kg 83.5 kg  ?   ? ?Telemetry  ?  ?Afib with RVR with rates up to 150 - Personally Reviewed ? ?ECG  ?  ?No new tracing - Personally Reviewed ? ?Physical Exam  ? ?GEN: Elderly female, somnolent but arousable to voice  ?Neck: No JVD ?Cardiac: Irregularly irregular, tachycardic, no murmurs ?Respiratory: Clear to auscultation bilaterally. ?GI: Soft, nontender, non-distended  ?MS: Trace edema; dry mucosa ?Neuro:  Somnolent but arousable, grossly nonfocal ?Psych: Normal affect  ? ?Labs  ?  ?High Sensitivity Troponin:   ?Recent Labs  ?Lab 05/20/21 ?2212 05/21/21 ?0017 06/10/21 ?9518 06/10/21 ?0745  ?TROPONINIHS 21* 19* 10  9  ?   ?Chemistry ?Recent Labs  ?Lab 06/10/21 ?0609 06/11/21 ?0404 06/12/21 ?3244 06/13/21 ?0415 06/14/21 ?0423  ?NA 144   < > 146* 142 139  ?K 3.1*   < > 4.1 3.7 3.9  ?CL 105   < > 110 104 102  ?CO2 29   < > '25 28 27  '$ ?GLUCOSE 100*   < > 128* 104* 127*  ?BUN 15   < > '12 12 17  '$ ?CREATININE 0.64   < > 0.72 0.71 0.94  ?CALCIUM 8.8*   < > 8.6* 8.2* 8.4*  ?MG  --    < > 1.8 1.8 1.7  ?PROT 6.7  --   --   --   --   ?ALBUMIN 3.0*  --   --   --   --   ?AST 16  --   --   --   --   ?ALT 17  --   --   --   --   ?ALKPHOS 148*  --   --   --   --   ?BILITOT 1.0  --   --   --   --   ?GFRNONAA >60   < > >60 >60 >60  ?ANIONGAP 10   < > '11 10 10  '$ ? < > = values in this interval not displayed.  ?  ?Lipids No results for input(s): CHOL, TRIG, HDL, LABVLDL, LDLCALC, CHOLHDL in the last 168 hours.  ?Hematology ?Recent Labs  ?Lab 06/11/21 ?0404 06/12/21 ?0102  06/14/21 ?0423  ?WBC 7.2 9.7 9.5  ?RBC 3.67* 4.26 4.11  ?HGB 9.3* 11.0* 10.8*  ?HCT 31.7* 37.9 37.0  ?MCV 86.4 89.0 90.0  ?MCH 25.3* 25.8* 26.3  ?MCHC 29.3* 29.0* 29.2*  ?RDW 19.8* 20.2* 20.3*  ?PLT 269 283 272  ? ?Thyroid  ?Recent Labs  ?Lab 06/10/21 ?0745  ?TSH 2.266  ?  ?BNP ?Recent Labs  ?Lab 06/10/21 ?0609  ?BNP 644.0*  ?  ?DDimer No results for input(s): DDIMER in the last 168 hours.  ? ?Radiology  ?  ?No results found. ? ?Cardiac Studies  ? ?Echocardiogram: 05/21/2021 ?IMPRESSIONS  ? ? ? 1. Left ventricular ejection fraction, by estimation, is 65 to 70%. The  ?left ventricle has normal function. The left ventricle has no regional  ?wall motion abnormalities. There is mild left ventricular hypertrophy.  ?Left ventricular diastolic parameters  ?are indeterminate.  ? 2. Right ventricular systolic function is normal. The right ventricular  ?size is normal. There is moderately elevated pulmonary artery systolic  ?pressure.  ? 3. The mitral valve is normal in structure. Trivial mitral valve  ?regurgitation.  ? 4. The aortic valve is tricuspid. Aortic valve regurgitation is not  ?visualized.  ? ?Comparison(s): The left ventricular function is unchanged.  ? ?Patient Profile  ?   ?76 y.o. female with hx of permanent atrial fibrillation, history of GIB secondary to AVM's, HFpEF, HTN, HLD, Stage 3 CKD and prior CVA who presented after fall found to be in Afib wit RVR for which Cardiology was consulted.  ? ?Assessment & Plan  ?  ?#Atrial Fibrillation with RVR ?Patient with known permanent atrial fibrillation but presented in atrial fibrillation with RVR after suffering a fall. Was started on dilt gtt with attempts to change to PO but developed recurrent RVR now resumed on dilt gtt. HR remain elevated this AM in the setting of fevers.  ?- Continue dilt gtt for now ?- Will load with digoxin  ?- Continue metop  $'100mg'P$  PO BID ?- Continue xarelto for [facility] ?- Management of fevers, elevated lactate per primary as likely  contributing to elevated rates ? ?#Fevers: ?Patient with fever to 101 this AM. Lactate 2.6. Procal negative. Likely contributing to elevated HR.  ?-Management per primary ?  ?#HFpEF ?TTE 05/2021 EF was at 65-70% with no regional WMA. BNP was at 644 on admission s/p IV lasix with improvement in volume status. Appears slightly dry this AM. ?-Hold further lasix for now ?-Monitor I/Os and daily weights ?  ?#HTN ?Controlled. Had episodes of low blood pressure initially with dilt which has since improved. ?-Continue dilt gtt for now ?-Continue metop '100mg'$  BID ?  ?#Iliopsoas Abscess ?She underwent I&D last month and just completed antibiotic therapy. She [facility] as an outpatient.  ? ?   ? ?For questions or updates, please contact [place] ?Please consult [email or web address] for contact info under  ? ?  ?   ?Signed, ?Freada Bergeron, MD  ?06/14/2021, 8:56 AM    ?

## 2021-06-15 ENCOUNTER — Encounter (HOSPITAL_COMMUNITY): Payer: 59

## 2021-06-15 DIAGNOSIS — R509 Fever, unspecified: Secondary | ICD-10-CM

## 2021-06-15 DIAGNOSIS — B999 Unspecified infectious disease: Secondary | ICD-10-CM

## 2021-06-15 DIAGNOSIS — I10 Essential (primary) hypertension: Secondary | ICD-10-CM | POA: Diagnosis not present

## 2021-06-15 DIAGNOSIS — E785 Hyperlipidemia, unspecified: Secondary | ICD-10-CM

## 2021-06-15 DIAGNOSIS — R5081 Fever presenting with conditions classified elsewhere: Secondary | ICD-10-CM

## 2021-06-15 DIAGNOSIS — B964 Proteus (mirabilis) (morganii) as the cause of diseases classified elsewhere: Secondary | ICD-10-CM

## 2021-06-15 DIAGNOSIS — L03116 Cellulitis of left lower limb: Secondary | ICD-10-CM | POA: Diagnosis not present

## 2021-06-15 DIAGNOSIS — E1169 Type 2 diabetes mellitus with other specified complication: Secondary | ICD-10-CM

## 2021-06-15 DIAGNOSIS — E876 Hypokalemia: Secondary | ICD-10-CM

## 2021-06-15 DIAGNOSIS — L97429 Non-pressure chronic ulcer of left heel and midfoot with unspecified severity: Secondary | ICD-10-CM

## 2021-06-15 DIAGNOSIS — B955 Unspecified streptococcus as the cause of diseases classified elsewhere: Secondary | ICD-10-CM

## 2021-06-15 DIAGNOSIS — L89302 Pressure ulcer of unspecified buttock, stage 2: Secondary | ICD-10-CM

## 2021-06-15 DIAGNOSIS — I5032 Chronic diastolic (congestive) heart failure: Secondary | ICD-10-CM

## 2021-06-15 DIAGNOSIS — K6812 Psoas muscle abscess: Secondary | ICD-10-CM

## 2021-06-15 DIAGNOSIS — L97509 Non-pressure chronic ulcer of other part of unspecified foot with unspecified severity: Secondary | ICD-10-CM

## 2021-06-15 DIAGNOSIS — S0003XA Contusion of scalp, initial encounter: Secondary | ICD-10-CM

## 2021-06-15 DIAGNOSIS — R410 Disorientation, unspecified: Secondary | ICD-10-CM

## 2021-06-15 DIAGNOSIS — M05719 Rheumatoid arthritis with rheumatoid factor of unspecified shoulder without organ or systems involvement: Secondary | ICD-10-CM

## 2021-06-15 DIAGNOSIS — S0003XD Contusion of scalp, subsequent encounter: Secondary | ICD-10-CM

## 2021-06-15 DIAGNOSIS — N390 Urinary tract infection, site not specified: Secondary | ICD-10-CM

## 2021-06-15 DIAGNOSIS — Y95 Nosocomial condition: Secondary | ICD-10-CM

## 2021-06-15 DIAGNOSIS — M05711 Rheumatoid arthritis with rheumatoid factor of right shoulder without organ or systems involvement: Secondary | ICD-10-CM

## 2021-06-15 DIAGNOSIS — I739 Peripheral vascular disease, unspecified: Secondary | ICD-10-CM

## 2021-06-15 DIAGNOSIS — L89312 Pressure ulcer of right buttock, stage 2: Secondary | ICD-10-CM

## 2021-06-15 DIAGNOSIS — M05712 Rheumatoid arthritis with rheumatoid factor of left shoulder without organ or systems involvement: Secondary | ICD-10-CM

## 2021-06-15 DIAGNOSIS — E11621 Type 2 diabetes mellitus with foot ulcer: Secondary | ICD-10-CM

## 2021-06-15 DIAGNOSIS — R7881 Bacteremia: Secondary | ICD-10-CM

## 2021-06-15 DIAGNOSIS — I4891 Unspecified atrial fibrillation: Secondary | ICD-10-CM | POA: Diagnosis not present

## 2021-06-15 DIAGNOSIS — R451 Restlessness and agitation: Secondary | ICD-10-CM

## 2021-06-15 LAB — BLOOD CULTURE ID PANEL (REFLEXED) - BCID2

## 2021-06-15 LAB — BASIC METABOLIC PANEL
Anion gap: 10 (ref 5–15)
BUN: 14 mg/dL (ref 8–23)
CO2: 25 mmol/L (ref 22–32)
Calcium: 8 mg/dL — ABNORMAL LOW (ref 8.9–10.3)
Chloride: 104 mmol/L (ref 98–111)
Creatinine, Ser: 0.79 mg/dL (ref 0.44–1.00)
GFR, Estimated: >60 mL/min (ref >60)
Glucose, Bld: 88 mg/dL (ref 70–99)
Potassium: 3.7 mmol/L (ref 3.5–5.1)
Sodium: 139 mmol/L (ref 135–145)

## 2021-06-15 LAB — CBC
HCT: 31.2 % — ABNORMAL LOW (ref 36.0–46.0)
Hemoglobin: 9.4 g/dL — ABNORMAL LOW (ref 12.0–15.0)
MCH: 26.7 pg (ref 26.0–34.0)
MCHC: 30.1 g/dL (ref 30.0–36.0)
MCV: 88.6 fL (ref 80.0–100.0)
Platelets: 234 10*3/uL (ref 150–400)
RBC: 3.52 MIL/uL — ABNORMAL LOW (ref 3.87–5.11)
RDW: 20 % — ABNORMAL HIGH (ref 11.5–15.5)
WBC: 9.9 10*3/uL (ref 4.0–10.5)
nRBC: 0 % (ref 0.0–0.2)

## 2021-06-15 LAB — GLUCOSE, CAPILLARY
Glucose-Capillary: 114 mg/dL — ABNORMAL HIGH (ref 70–99)
Glucose-Capillary: 186 mg/dL — ABNORMAL HIGH (ref 70–99)
Glucose-Capillary: 193 mg/dL — ABNORMAL HIGH (ref 70–99)
Glucose-Capillary: 105 mg/dL — ABNORMAL HIGH (ref 70–99)

## 2021-06-15 LAB — LACTIC ACID, PLASMA: Lactic Acid, Venous: 1 mmol/L (ref 0.5–1.9)

## 2021-06-15 LAB — MAGNESIUM: Magnesium: 1.8 mg/dL (ref 1.7–2.4)

## 2021-06-15 LAB — PROCALCITONIN: Procalcitonin: 15.73 ng/mL

## 2021-06-15 MED ORDER — DILTIAZEM HCL 60 MG PO TABS
60.0000 mg | ORAL_TABLET | Freq: Four times a day (QID) | ORAL | Status: DC
  Administered 2021-06-15 – 2021-06-16 (×3): 60 mg via ORAL
  Filled 2021-06-15 (×3): qty 1

## 2021-06-15 NOTE — Progress Notes (Signed)
?PROGRESS NOTE ? ? ? BRET STAMOUR  CVE:[id number] DOB: 01/06/1946 DOA: 06/10/2021 ?PCP: Aletha Halim., PA-C ? ? ?Brief Narrative:  ?Per HPI: ?[name] is a 75 y.o. female with medical history significant of paroxysmal atrial fibrillation on Xarelto for anticoagulation, chronic diastolic heart failure, dyslipidemia, hypertension, type 2 diabetes, prior CVA, peripheral vascular disease, rheumatoid arthritis, and chronic lower extremity wounds who presented to the ED after a fall at her SNF that was unwitnessed.  Upon arrival to the ED, she was found to be in atrial fibrillation with RVR and was also noted to be quite hypertensive.  She denies any chest pain, palpitations, or shortness of breath and complains of her left lower extremity erythema.  She appears to have been seen by wound care on 3/8 with debridements done at that time with no concern for cellulitis. ?  ?She was recently hospitalized for iliopsoas abscess that was drained and discharged on 2/21.  She has been completing her antibiotics at SNF. ?  ?In the ED, CT head was performed with no acute findings and she was noted to have atrial fibrillation with RVR with heart rate in the 150 bpm range.  She has been started on Cardizem drip for heart rate control. ? ?06/11/21: Patient has been admitted for heart rate control with noted atrial fibrillation with RVR.  She continues to remain on Cardizem drip.  Cardiology has assessed patient with recommendations to change Cardizem dosing today.  Electrolyte abnormalities to be corrected. ? ?06/12/21: Patient continues to have elevated heart rates requiring further adjustment of oral Cardizem dose.  Remains off Cardizem drip at this time.  We will also administer 1 dose of IV Lasix per cardiology recommendations on 3/10. ? ?06/13/21: Heart rates are better controlled with increased dose of Cardizem to 90 mg every 6 hours.  Plan to administer 3 more doses and then anticipate cardiology follow-up in a.m. with  likely consolidation of dose versus increase after further evaluation. ? ?06/14/21: Heart rates demonstrate worsening control and will require reinitiation of Cardizem drip.  Cardiology following with plans to load digoxin today.  Initially noted to have increased lactic acid levels which are now downtrending.  Evaluating for source of infection. ? ?06/15/21: Patient noted to have UTI with Proteus bacteremia and was started on Rocephin empirically 3/13.  She appears to be showing signs of improvement and will attempt to be weaned off Cardizem drip today per cardiology.  ? ? ?Assessment & Plan: ?  ?Principal Problem: ?  Atrial fibrillation with RVR ([place]) ?Active Problems: ?  Hypertension ?  Type 2 diabetes mellitus with hyperlipidemia ([place]) ?  Rheumatoid arthritis ([place]) ?  Peripheral vascular disease ([place]) ?  Chronic diastolic (congestive) heart failure (HCC) ?  Iliopsoas abscess on left [facility]) ?  Hypokalemia ? ?Assessment and Plan: ? ?Atrial fibrillation with RVR ([place]) ?Cardizem drip to be weaned off with p.o. Cardizem 60 mg every 6 hours reinitiated ?TSH within normal limits ?Recent 2D echocardiogram noted on 2/23 and will avoid for now ?IV metoprolol as needed for elevated heart rates ?Likely will require increasing doses of Cardizem upon discharge ?Continue anticoagulation with Xarelto ?Appreciate cardiology recommendations-ongoing ? ?UTI with Proteus bacteremia ?Continue treatment with Rocephin ?Further sensitivities pending ?Procalcitonin elevated ?Monitor CBC ?  ?Iliopsoas abscess on left [facility]) ?Patient finishing up [place] today ?  ?Chronic diastolic (congestive) heart failure (HCC) ?Hold further doses of Lasix for now ?Chest x-ray with bilateral effusions noted ?2D echocardiogram recently performed ?  ?Peripheral vascular  disease ([place]) ?Noted to have wounds on lower extremities which is being followed by outpatient wound care 3/8 ?  ?Rheumatoid arthritis ([place]) ?Continue prednisone ?  ?Type 2 diabetes mellitus  with hyperlipidemia ([place]) ?SSI while inpatient ?Resume statin ?  ?Hypertension ?Noted to have elevated blood pressures on admission, monitor ?She had improved with increasing doses of Cardizem as well as dosing of Lasix ?  ?Lactic acidosis ?Currently improved ?Related to UTI above with Proteus bacteremia ?  ?DVT prophylaxis: Xarelto ?Code Status: DNR ?Family Communication: Discussed with son on phone 3/14 ?Disposition Plan:  ?Status is: Inpatient ?Remains inpatient appropriate because: Need for IV Cardizem drip for heart rate control. ?  ?  ?Nutritional Assessment: ?  ?The patient?s BMI is: Body mass index is 26.17 kg/m?[name]. ?  ?Seen by dietician.  I agree with the assessment and plan as outlined below: ?  ?Nutrition Status: ?Skin Assessment: ?  ?I have examined the patient?s skin and I agree with the wound assessment as performed by the wound care RN as outlined below: ?  ?Pressure Injury 05/21/21 Buttocks Right Stage 2 -  Partial thickness loss of dermis presenting as a shallow open injury with a red, pink wound bed without slough. (Active)  ?05/21/21 1131  ?Location: Buttocks  ?Location Orientation: Right  ?Staging: Stage 2 -  Partial thickness loss of dermis presenting as a shallow open injury with a red, pink wound bed without slough.  ?Wound Description (Comments):   ?Present on Admission: Yes  ?   ?Pressure Injury 05/21/21 Buttocks Right Stage 2 -  Partial thickness loss of dermis presenting as a shallow open injury with a red, pink wound bed without slough. (Active)  ?05/21/21 1132  ?Location: Buttocks  ?Location Orientation: Right  ?Staging: Stage 2 -  Partial thickness loss of dermis presenting as a shallow open injury with a red, pink wound bed without slough.  ?Wound Description (Comments):   ?Present on Admission: Yes  ?   ?Pressure Injury 06/10/21 Heel Left Stage 2 -  Partial thickness loss of dermis presenting as a shallow open injury with a red, pink wound bed without slough. (Active)  ?06/10/21 1435   ?Location: Heel  ?Location Orientation: Left (Heel)  ?Staging: Stage 2 -  Partial thickness loss of dermis presenting as a shallow open injury with a red, pink wound bed without slough.  ?Wound Description (Comments):   ?Present on Admission: Yes  ?  ?  ?Consultants:  ?Cardiology ?  ?Procedures:  ?See below ?  ?Antimicrobials:  ?Anti-infectives (From admission, onward)  ? ? Start     Dose/Rate Route Frequency Ordered Stop  ? 06/14/21 1715  cefTRIAXone (ROCEPHIN) 2 g in sodium chloride 0.9 % 100 mL IVPB       ? 2 g ?200 mL/hr over 30 Minutes Intravenous Every 24 hours 06/14/21 1617    ? 06/10/21 2200  cefadroxil (DURICEF) capsule 1,000 mg  Status:  Discontinued       ? 1,000 mg Oral 2 times daily 06/10/21 1012 06/11/21 0818  ? 06/10/21 1000  cefadroxil (DURICEF) capsule 1,000 mg  Status:  Discontinued       ? 1,000 mg Oral 2 times daily 06/10/21 0943 06/10/21 1012  ? ?  ? ? ?Subjective: ?Patient seen and evaluated today with overall improvement in alertness with no further fevers noted overnight.  She continues to have elevated heart rates and states that she is feeling weak. ? ?Objective: ?Vitals:  ? 06/15/21 0600 06/15/21 0700 06/15/21 0749 06/15/21 0830  ?  BP: 134/72 140/70  134/62  ?Pulse: (!) 115 (!) 111  (!) 110  ?Resp: 13 (!) 25  (!) 24  ?Temp:   99 ?F (37.2 ?C)   ?TempSrc:   Axillary   ?SpO2: 93% 97%  98%  ?Weight: 71.1 kg     ?Height:      ? ? ?Intake/Output Summary (Last 24 hours) at 06/15/2021 1038 ?Last data filed at 06/15/2021 0830 ?Gross per 24 hour  ?Intake 2241.69 ml  ?Output 950 ml  ?Net 1291.69 ml  ? ?Filed Weights  ? 06/10/21 1441 06/11/21 0437 06/15/21 0600  ?Weight: 77.2 kg 75.8 kg 71.1 kg  ? ? ?Examination: ? ?General exam: Appears calm and comfortable  ?Respiratory system: Clear to auscultation. Respiratory effort normal.  Nasal cannula oxygen ?Cardiovascular system: S1 & S2 heard, irregular and tachycardic ?Gastrointestinal system: Abdomen is soft ?Central nervous system: Alert and  awake ?Extremities: No edema ?Skin: No significant lesions noted ?Psychiatry: Flat affect. ? ? ? ?Data Reviewed: I have personally reviewed following labs and imaging studies ? ?CBC: ?Recent Labs  ?Lab 06/10/21 ?[id number]

## 2021-06-15 NOTE — Plan of Care (Signed)

## 2021-06-15 NOTE — Progress Notes (Signed)
? ?Progress Note ? ?Patient Name: [name] ?Date of Encounter: 06/15/2021 ? ?[facility] Cardiologist: Kirk Ruths, MD  ? ?Subjective  ? ?Feeling better this morning. More alert and awake. Only oriented to self this AM. ? ?HR 110s on dilt gtt ?Lactate normalized ?Procal 15.7, UA grossly positive and started on ceftriaxone ? ?Inpatient Medications  ?  ?Scheduled Meds: ? (feeding supplement) PROSource Plus  30 mL Oral q AM  ? vitamin C  500 mg Oral Daily  ? atorvastatin  20 mg Oral Daily  ? buPROPion ER  100 mg Oral BID  ? Chlorhexidine Gluconate Cloth  6 each Topical Q0600  ? cycloSPORINE  1 drop Both Eyes BID  ? gabapentin  300 mg Oral TID  ? icosapent Ethyl  1 g Oral q morning  ? insulin aspart  0-15 Units Subcutaneous TID WC  ? insulin aspart  0-5 Units Subcutaneous QHS  ? ketotifen  1 drop Both Eyes BID  ? loratadine  10 mg Oral Daily  ? magnesium oxide  400 mg Oral Daily  ? mouth rinse  15 mL Mouth Rinse BID  ? metoprolol tartrate  100 mg Oral BID  ? mometasone-formoterol  2 puff Inhalation BID  ? multivitamin with minerals  1 tablet Oral Daily  ? multivitamin-lutein  1 capsule Oral Daily  ? mupirocin ointment  1 application. Nasal BID  ? pantoprazole  40 mg Oral Daily  ? polyvinyl alcohol  2 drop Both Eyes Daily  ? predniSONE  5 mg Oral Q breakfast  ? Rivaroxaban  20 mg Oral q1800  ? saccharomyces boulardii  250 mg Oral BID  ? senna  1 tablet Oral Daily  ? sodium chloride flush  3 mL Intravenous Q12H  ? zinc sulfate  220 mg Oral Daily  ? ?Continuous Infusions: ? sodium chloride    ? cefTRIAXone (ROCEPHIN)  IV 2 g (06/14/21 1729)  ? diltiazem (CARDIZEM) infusion 10 mg/hr (06/15/21 0200)  ? lactated ringers 75 mL/hr at 06/14/21 2044  ? ?PRN Meds: ?sodium chloride, acetaminophen **OR** acetaminophen, albuterol, diclofenac Sodium, guaiFENesin-dextromethorphan, metoprolol tartrate, Muscle Rub, nitroGLYCERIN, ondansetron **OR** ondansetron (ZOFRAN) IV, oxyCODONE, polyethylene glycol, sodium chloride flush   ? ?Vital Signs  ?  ?Vitals:  ? 06/15/21 0300 06/15/21 0400 06/15/21 0500 06/15/21 0600  ?BP: 137/81 (!) 123/51 (!) 137/51 134/72  ?Pulse: (!) 115 (!) 114 (!) 113 (!) 115  ?Resp: (!) 30 (!) 26 (!) 24 13  ?Temp:      ?TempSrc:      ?SpO2: 97% 96% 97% 93%  ?Weight:    71.1 kg  ?Height:      ? ? ?Intake/Output Summary (Last 24 hours) at 06/15/2021 0636 ?Last data filed at 06/15/2021 0600 ?Gross per 24 hour  ?Intake 2029.15 ml  ?Output 950 ml  ?Net 1079.15 ml  ? ? ?Last 3 Weights 06/15/2021 06/11/2021 06/10/2021  ?Weight (lbs) 156 lb 12 oz 167 lb 1.7 oz 170 lb 3.1 oz  ?Weight (kg) 71.1 kg 75.8 kg 77.2 kg  ?   ? ?Telemetry  ?  ?Afib with HR 90-120s - Personally Reviewed ? ?ECG  ?  ?No new tracing - Personally Reviewed ? ?Physical Exam  ? ?GEN: Elderly female, more alert and awake this morning ?Neck: No JVD ?Cardiac: Irregularly irregular, tachycardic, no murmurs ?Respiratory: Clear to auscultation bilaterally. ?GI: Soft, nontender, non-distended  ?MS: Trace edema; dry mucosa ?Neuro:  Alert and awake; AAOx1 (to self), able to follow commands ?Psych: Normal affect  ? ?Labs  ?  ?  High Sensitivity Troponin:   ?Recent Labs  ?Lab 05/20/21 ?2212 05/21/21 ?0017 06/10/21 ?0998 06/10/21 ?0745  ?TROPONINIHS 21* 19* 10 9  ? ?   ?Chemistry ?Recent Labs  ?Lab 06/10/21 ?0609 06/11/21 ?0404 06/13/21 ?0415 06/14/21 ?0423 06/15/21 ?3382  ?NA 144   < > 142 139 139  ?K 3.1*   < > 3.7 3.9 3.7  ?CL 105   < > 104 102 104  ?CO2 29   < > '28 27 25  '$ ?GLUCOSE 100*   < > 104* 127* 88  ?BUN 15   < > '12 17 14  '$ ?CREATININE 0.64   < > 0.71 0.94 0.79  ?CALCIUM 8.8*   < > 8.2* 8.4* 8.0*  ?MG  --    < > 1.8 1.7 1.8  ?PROT 6.7  --   --   --   --   ?ALBUMIN 3.0*  --   --   --   --   ?AST 16  --   --   --   --   ?ALT 17  --   --   --   --   ?ALKPHOS 148*  --   --   --   --   ?BILITOT 1.0  --   --   --   --   ?GFRNONAA >60   < > >60 >60 >60  ?ANIONGAP 10   < > '10 10 10  '$ ? < > = values in this interval not displayed.  ? ?  ?Lipids No results for input(s): CHOL, TRIG,  HDL, LABVLDL, LDLCALC, CHOLHDL in the last 168 hours.  ?Hematology ?Recent Labs  ?Lab 06/12/21 ?5053 06/14/21 ?0423 06/15/21 ?9767  ?WBC 9.7 9.5 9.9  ?RBC 4.26 4.11 3.52*  ?HGB 11.0* 10.8* 9.4*  ?HCT 37.9 37.0 31.2*  ?MCV 89.0 90.0 88.6  ?MCH 25.8* 26.3 26.7  ?MCHC 29.0* 29.2* 30.1  ?RDW 20.2* 20.3* 20.0*  ?PLT 283 272 234  ? ? ?Thyroid  ?Recent Labs  ?Lab 06/10/21 ?0745  ?TSH 2.266  ? ?  ?BNP ?Recent Labs  ?Lab 06/10/21 ?0609  ?BNP 644.0*  ? ?  ?DDimer No results for input(s): DDIMER in the last 168 hours.  ? ?Radiology  ?  ?DG CHEST PORT 1 VIEW ? ?Result Date: 06/14/2021 ?CLINICAL DATA:  A 76 year old female presents for evaluation of fever. EXAM: PORTABLE CHEST 1 VIEW COMPARISON:  June 10, 2021. FINDINGS: Image is rotated to the RIGHT. Accounting for this cardiomediastinal contours and hilar structures are stable Increasing graded opacity RIGHT and LEFT chest RIGHT greater than LEFT. Obscured medial RIGHT hemidiaphragm and obscured margin of LEFT hemidiaphragm. No visible pneumothorax. Osteopenia.  No acute skeletal process on limited evaluation. IMPRESSION: Bilateral effusions RIGHT greater than LEFT, moderate on the RIGHT. Bibasilar airspace disease may represent volume loss or developing infection. Would also correlate with any signs of heart failure. Electronically Signed   By: Zetta Bills M.D.   On: 06/14/2021 11:12   ? ?Cardiac Studies  ? ?Echocardiogram: 05/21/2021 ?IMPRESSIONS  ? ? ? 1. Left ventricular ejection fraction, by estimation, is 65 to 70%. The  ?left ventricle has normal function. The left ventricle has no regional  ?wall motion abnormalities. There is mild left ventricular hypertrophy.  ?Left ventricular diastolic parameters  ?are indeterminate.  ? 2. Right ventricular systolic function is normal. The right ventricular  ?size is normal. There is moderately elevated pulmonary artery systolic  ?pressure.  ? 3. The mitral valve is normal in structure. Trivial mitral  valve  ?regurgitation.  ? 4.  The aortic valve is tricuspid. Aortic valve regurgitation is not  ?visualized.  ? ?Comparison(s): The left ventricular function is unchanged.  ? ?Patient Profile  ?   ?76 y.o. female with hx of permanent atrial fibrillation, history of GIB secondary to AVM's, HFpEF, HTN, HLD, Stage 3 CKD and prior CVA who presented after fall found to be in Afib wit RVR for which Cardiology was consulted.  ? ?Assessment & Plan  ?  ?#Atrial Fibrillation with RVR ?Patient with known permanent atrial fibrillation but presented in atrial fibrillation with RVR after suffering a fall. Was started on dilt gtt with attempts to change to PO but developed recurrent RVR now resumed on dilt gtt. Likely worsening in HR related to UTI. Rates overall improved today ?- Will resume PO dilt '60mg'$  q6h and wean dilt gtt as tolerated ?- Continue metop '100mg'$  PO BID ?- Continue xarelto for [facility] ?  ?#HFpEF ?TTE 05/2021 EF was at 65-70% with no regional WMA. BNP was at 644 on admission s/p IV lasix with improvement in volume status. Appeared dry yesterday in setting of UTI s/p IVF.  ?-Hold lasix  ?-Monitor I/Os and daily weights ? ?#UTI: ?Patient febrile and more confused yesterday found to have procal 15 and UA grossly positive consistent with UTI. Now started on ceftriaxone. ?-ABX per primary team ?  ?#HTN ?Controlled. Had episodes of low blood pressure initially with dilt which has since improved. ?-Continue dilt gtt for now ?-Continue metop '100mg'$  BID ?  ?#Iliopsoas Abscess ?She underwent I&D last month and just completed antibiotic therapy. She is [facility] by [facility] as an outpatient.  ? ?   ? ?For questions or updates, please contact [place] ?Please consult [email or web address] for contact info under  ? ?  ?   ?Signed, ?Freada Bergeron, MD  ?06/15/2021, 6:36 AM    ?

## 2021-06-15 NOTE — Consult Note (Addendum)
? ? ? ? ? ? ?Virtual Visit via Video Note ? ?I connected with [place] on '@TODAY'$ @ at  by a video enabled telemedicine application and verified that I am speaking with the correct person using two identifiers. ? ?Location: ?Patient: [name] ICU Bed 3 ?Provider: Home ?  ?I discussed the limitations of evaluation and management by telemedicine and the availability of in person appointments. The patient expressed understanding and agreed to proceed. ? ?  ? ? ? ?Date of Admission:  06/10/2021    ?      ?Reason for Consult:  Proteus bacteremia    ?Referring Provider: Heath Lark, MD ? ? ?Assessment: ? ?Proteus bacteremia, presumed urinary source  though not clear  ?Recent Group C streptococcal bacteremia with sepsis and iliopsoas abscess sp IR guided drainage and IV antibiotics to cefadroxil ?Group B streptococcal bacteremia in 2021 ?Rheumatoid arthritis ?Atrial fibrillation with RVR ?PVD ?Diabetic foot ulcer ?Lower extremity wounds ?Pressure ulcers heel, buttocks ?Delirium and agitation ? ?Plan: ? ?Continue ceftriaxone ?Follow-up blood and urine cultures ?If we cannot find a specific source for her bacteremia may need to more closely examine her wounds, her diabetic foot ulcer, pressure ulcer  her GI tract and consider repeat imaging of her deep back muscles to make sure iliopsoas abscess is resolved ?Would also ensure her IVs are all working well and no signs of phlebitis (gram negative bacteremias are less often associated with PIV but not impossible ? ?Principal Problem: ?  Atrial fibrillation with RVR ([place]) ?Active Problems: ?  Hypertension ?  Type 2 diabetes mellitus with hyperlipidemia ([place]) ?  Rheumatoid arthritis ([place]) ?  Peripheral vascular disease ([place]) ?  Chronic diastolic (congestive) heart failure (HCC) ?  Iliopsoas abscess on left [facility]) ?  Hypokalemia ? ? ?Scheduled Meds: ? (feeding supplement) PROSource Plus  30 mL Oral q AM  ? vitamin C  500 mg Oral Daily  ? atorvastatin  20 mg Oral Daily  ?  buPROPion ER  100 mg Oral BID  ? Chlorhexidine Gluconate Cloth  6 each Topical Q0600  ? cycloSPORINE  1 drop Both Eyes BID  ? diltiazem  60 mg Oral Q6H  ? gabapentin  300 mg Oral TID  ? icosapent Ethyl  1 g Oral q morning  ? insulin aspart  0-15 Units Subcutaneous TID WC  ? insulin aspart  0-5 Units Subcutaneous QHS  ? ketotifen  1 drop Both Eyes BID  ? loratadine  10 mg Oral Daily  ? magnesium oxide  400 mg Oral Daily  ? mouth rinse  15 mL Mouth Rinse BID  ? metoprolol tartrate  100 mg Oral BID  ? mometasone-formoterol  2 puff Inhalation BID  ? multivitamin with minerals  1 tablet Oral Daily  ? multivitamin-lutein  1 capsule Oral Daily  ? mupirocin ointment  1 application. Nasal BID  ? pantoprazole  40 mg Oral Daily  ? polyvinyl alcohol  2 drop Both Eyes Daily  ? predniSONE  5 mg Oral Q breakfast  ? Rivaroxaban  20 mg Oral q1800  ? saccharomyces boulardii  250 mg Oral BID  ? senna  1 tablet Oral Daily  ? sodium chloride flush  3 mL Intravenous Q12H  ? zinc sulfate  220 mg Oral Daily  ? ?Continuous Infusions: ? sodium chloride    ? cefTRIAXone (ROCEPHIN)  IV 2 g (06/14/21 1729)  ? diltiazem (CARDIZEM) infusion 10 mg/hr (06/15/21 1200)  ? lactated ringers 75 mL/hr at 06/15/21 1050  ? ?  PRN Meds:.sodium chloride, acetaminophen **OR** acetaminophen, albuterol, diclofenac Sodium, guaiFENesin-dextromethorphan, metoprolol tartrate, Muscle Rub, nitroGLYCERIN, ondansetron **OR** ondansetron (ZOFRAN) IV, oxyCODONE, polyethylene glycol, sodium chloride flush ? ?HPI: [name] is a 76 y.o. female with multiple medical problems including history of stroke, rheumatoid arthritis, atrial fibrillation diastolic heart failure diabetes mellitus peripheral vascular disease who was admitted in February with group C streptococcal bacteremia and sepsis.  During that hospitalization she was found to have an iliopsoas abscess that was drained by interventional radiology cultures on antibiotic failed to yield an organism and she was  ultimately transitioned to cefadroxil at discharge after having been on IV ceftriaxone prior to that. ? ?She did have a 2D echocardiogram during that hospitalization that showed no evidence of endocarditis. ? ?She was brought to the emergency department after she had a fall at her skilled nursing facility that was unwitnessed.  In the ER she was found to be in atrial fibrillation with a rapid ventricular response. ? ?During her hospitalization she became febrile over 101 degrees on the 13th.  Urine analysis was performed which showed pyuria and blood cultures and urine cultures have been sent blood cultures have returned with Proteus in 2 out of 2 sites.  Urine cultures are still incubating. ? ?I do not see documentation of a Foley catheter but that of a external urinary catheter.  Currently the presumption is that her bacteremia is from a urinary source. ? ?It is difficult to ascertain because the patient currently is delirious and not able to communicate well with me. ? ?I could not visualize her lower extremity wounds over the video feed when I conversed with her today. ? ?For now I would continue IV ceftriaxone. ? ?I would follow-up cultures and sensitivities from blood and follow-up urine culture. ? ?If urine culture fails to provide an answer as to the source of  her bacteremia then her wounds will require closer examination  ? Intrdominal source. and we could reconsider her iliopsoas abscess though that was with a different organism ? ?I spent 82 minutes with the patient including than 50% of the time in face to face counseling of the patient  re her bacteremia of presumed urinary source personally reviewing CT head CT abdomen and pelvis prior admission updated cultures along with review of medical records in preparation for the visit and during the visit and in coordination of her care. ? ? ?Review of Systems: ?Review of Systems  ?Unable to perform ROS: Mental acuity  ? ?Past Medical History:  ?Diagnosis  Date  ? Abnormal LFTs 07/24/2017  ? Actinic keratosis   ? Anemia   ? hx of  ? Anemia of chronic renal failure, stage 3 (moderate) ([place]) 07/17/2015  ? Anticoagulant causing adverse effect in therapeutic use 07/17/2015  ? Anxiety   ? Arthritis   ? rheumatoid  ? Atrial fibrillation ([place])   ? a. persistent, she remains on Xarelto  ? Carpal tunnel syndrome   ? Chronic diastolic (congestive) heart failure (HCC)   ? a. 04/2016: echo showing a preserved EF of 55-60% with mild MR. LA and RA midly dilated.   ? Edema   ? left leg at ankle resolved now  ? Gastroesophageal reflux disease   ? Hepatitis 1970  ? not sure what kind  ? Hiatal hernia   ? Hyperlipidemia   ? Hypertension   ? Iron deficiency anemia due to chronic blood loss 07/17/2015  ? Jaundice   ? age 59  ? Peripheral vascular disease ([place])  yrs ago  ? DVT left lower leg questionale told by 2 drs i had no clot, 1 md said i did  ? Rheumatoid arthritis(714.0)   ? Right knee pain   ? Stroke [facility])   ? Type II diabetes mellitus ([place])   ? type2  ? ? ?Social History  ? ?Tobacco Use  ? Smoking status: Never  ? Smokeless tobacco: Never  ?Vaping Use  ? Vaping Use: Never used  ?Substance Use Topics  ? Alcohol use: No  ?  Alcohol/week: 0.0 standard drinks  ? Drug use: No  ? ? ?Family History  ?Problem Relation Age of Onset  ? Pneumonia Mother 26  ? Stroke Father 7  ? Heart failure Sister   ? Hypertension Sister   ? Heart attack Neg Hx   ? ?Allergies  ?Allergen Reactions  ? Promethazine   ? ? ?OBJECTIVE: ?Blood pressure (!) 149/119, pulse 95, temperature 99.3 ?F (37.4 ?C), temperature source Axillary, resp. rate 20, height '5\' 7"'$  (1.702 m), weight 71.1 kg, SpO2 93 %. ? ?Physical Exam ?Vitals reviewed.  ?HENT:  ?   Head: Normocephalic and atraumatic.  ?Eyes:  ?   General:     ?   Right eye: No discharge.     ?   Left eye: No discharge.  ?   Extraocular Movements: Extraocular movements intact.  ?Cardiovascular:  ?   Rate and Rhythm: Tachycardia present. Rhythm irregular.  ?Pulmonary:  ?    Effort: No respiratory distress.  ?   Breath sounds: No wheezing.  ?Abdominal:  ?   General: There is no distension.  ?Skin: ?   Coloration: Skin is pale.  ?Neurological:  ?   General: No focal deficit present.

## 2021-06-16 ENCOUNTER — Inpatient Hospital Stay (HOSPITAL_COMMUNITY): Payer: 59

## 2021-06-16 ENCOUNTER — Encounter (HOSPITAL_BASED_OUTPATIENT_CLINIC_OR_DEPARTMENT_OTHER): Payer: 59 | Admitting: General Surgery

## 2021-06-16 DIAGNOSIS — I5032 Chronic diastolic (congestive) heart failure: Secondary | ICD-10-CM | POA: Diagnosis not present

## 2021-06-16 DIAGNOSIS — G9340 Encephalopathy, unspecified: Secondary | ICD-10-CM

## 2021-06-16 DIAGNOSIS — E876 Hypokalemia: Secondary | ICD-10-CM | POA: Diagnosis not present

## 2021-06-16 DIAGNOSIS — R7881 Bacteremia: Secondary | ICD-10-CM | POA: Diagnosis not present

## 2021-06-16 DIAGNOSIS — I4891 Unspecified atrial fibrillation: Secondary | ICD-10-CM | POA: Diagnosis not present

## 2021-06-16 LAB — BASIC METABOLIC PANEL
Anion gap: 8 (ref 5–15)
BUN: 12 mg/dL (ref 8–23)
CO2: 25 mmol/L (ref 22–32)
Calcium: 8.2 mg/dL — ABNORMAL LOW (ref 8.9–10.3)
Chloride: 106 mmol/L (ref 98–111)
Creatinine, Ser: 0.72 mg/dL (ref 0.44–1.00)
GFR, Estimated: >60 mL/min (ref >60)
Glucose, Bld: 105 mg/dL — ABNORMAL HIGH (ref 70–99)
Potassium: 4 mmol/L (ref 3.5–5.1)
Sodium: 139 mmol/L (ref 135–145)

## 2021-06-16 LAB — FERRITIN: Ferritin: 191 ng/mL (ref 11–307)

## 2021-06-16 LAB — CBC
HCT: 30.9 % — ABNORMAL LOW (ref 36.0–46.0)
Hemoglobin: 8.8 g/dL — ABNORMAL LOW (ref 12.0–15.0)
MCH: 25.1 pg — ABNORMAL LOW (ref 26.0–34.0)
MCHC: 28.5 g/dL — ABNORMAL LOW (ref 30.0–36.0)
MCV: 88.3 fL (ref 80.0–100.0)
Platelets: 228 10*3/uL (ref 150–400)
RBC: 3.5 MIL/uL — ABNORMAL LOW (ref 3.87–5.11)
RDW: 20.2 % — ABNORMAL HIGH (ref 11.5–15.5)
WBC: 8 10*3/uL (ref 4.0–10.5)
nRBC: 0 % (ref 0.0–0.2)

## 2021-06-16 LAB — GLUCOSE, CAPILLARY
Glucose-Capillary: 138 mg/dL — ABNORMAL HIGH (ref 70–99)
Glucose-Capillary: 88 mg/dL (ref 70–99)
Glucose-Capillary: 116 mg/dL — ABNORMAL HIGH (ref 70–99)
Glucose-Capillary: 117 mg/dL — ABNORMAL HIGH (ref 70–99)

## 2021-06-16 LAB — IRON AND TIBC
Iron: 52 ug/dL (ref 28–170)
Saturation Ratios: 21 % (ref 10.4–31.8)
TIBC: 250 ug/dL (ref 250–450)
UIBC: 198 ug/dL

## 2021-06-16 LAB — RETICULOCYTES
Immature Retic Fract: 22.4 % — ABNORMAL HIGH (ref 2.3–15.9)
RBC.: 3.38 MIL/uL — ABNORMAL LOW (ref 3.87–5.11)
Retic Count, Absolute: 72.7 10*3/uL (ref 19.0–186.0)
Retic Ct Pct: 2.2 % (ref 0.4–3.1)

## 2021-06-16 LAB — VITAMIN B12: Vitamin B-12: 519 pg/mL (ref 180–914)

## 2021-06-16 LAB — PROCALCITONIN: Procalcitonin: 8.71 ng/mL

## 2021-06-16 LAB — FOLATE: Folate: 15.2 ng/mL (ref >5.9)

## 2021-06-16 LAB — MAGNESIUM: Magnesium: 1.6 mg/dL — ABNORMAL LOW (ref 1.7–2.4)

## 2021-06-16 MED ORDER — NALOXONE HCL 0.4 MG/ML IJ SOLN: 0.4000 mg | INTRAMUSCULAR | Status: DC | PRN

## 2021-06-16 MED ORDER — MAGNESIUM SULFATE 2 GM/50ML IV SOLN
2.0000 g | Freq: Once | INTRAVENOUS | Status: AC
  Administered 2021-06-16: 2 g via INTRAVENOUS
  Filled 2021-06-16: qty 50

## 2021-06-16 MED ORDER — IOHEXOL 300 MG/ML  SOLN
100.0000 mL | Freq: Once | INTRAMUSCULAR | Status: AC | PRN
  Administered 2021-06-16: 100 mL via INTRAVENOUS

## 2021-06-16 MED ORDER — NALOXONE HCL 0.4 MG/ML IJ SOLN
INTRAMUSCULAR | Status: AC
  Administered 2021-06-16: 0.4 mg via INTRAVENOUS
  Filled 2021-06-16: qty 1

## 2021-06-16 MED ORDER — DILTIAZEM HCL 60 MG PO TABS
90.0000 mg | ORAL_TABLET | Freq: Four times a day (QID) | ORAL | Status: DC
  Administered 2021-06-16 – 2021-06-17 (×4): 90 mg via ORAL
  Filled 2021-06-16 (×4): qty 1

## 2021-06-16 NOTE — Progress Notes (Signed)
Patient given narcan x 1 per MD order and became more responsive.  Patient oriented to self and spontaneously moving all four extremities.  MD evaluated patient at bedside.  No further orders.    ?

## 2021-06-16 NOTE — Progress Notes (Signed)
Virtual Visit via Video Note ? ?I connected with [place] on  06/16/2021 at  by a video enabled telemedicine application and verified that I am speaking with the correct person using two identifiers. ? ?Location: ?Patient:  [name] ICU bed 3 ?Provider: Home ?  ?I discussed the limitations of evaluation and management by telemedicine and the availability of in person appointments. The patient expressed understanding and agreed to proceed. ? ? ? ? ? ? ? ? ?Subjective: ?Patient was delirious ? ? ?Antibiotics:  ?Anti-infectives (From admission, onward)  ? ? Start     Dose/Rate Route Frequency Ordered Stop  ? 06/14/21 1715  cefTRIAXone (ROCEPHIN) 2 g in sodium chloride 0.9 % 100 mL IVPB       ? 2 g ?200 mL/hr over 30 Minutes Intravenous Every 24 hours 06/14/21 1617    ? 06/10/21 2200  cefadroxil (DURICEF) capsule 1,000 mg  Status:  Discontinued       ? 1,000 mg Oral 2 times daily 06/10/21 1012 06/11/21 0818  ? 06/10/21 1000  cefadroxil (DURICEF) capsule 1,000 mg  Status:  Discontinued       ? 1,000 mg Oral 2 times daily 06/10/21 0943 06/10/21 1012  ? ?  ? ? ?Medications: ?Scheduled Meds: ? (feeding supplement) PROSource Plus  30 mL Oral q AM  ? vitamin C  500 mg Oral Daily  ? atorvastatin  20 mg Oral Daily  ? buPROPion ER  100 mg Oral BID  ? Chlorhexidine Gluconate Cloth  6 each Topical Q0600  ? cycloSPORINE  1 drop Both Eyes BID  ? diltiazem  90 mg Oral Q6H  ? gabapentin  300 mg Oral TID  ? icosapent Ethyl  1 g Oral q morning  ? insulin aspart  0-15 Units Subcutaneous TID WC  ? insulin aspart  0-5 Units Subcutaneous QHS  ? ketotifen  1 drop Both Eyes BID  ? loratadine  10 mg Oral Daily  ? magnesium oxide  400 mg Oral Daily  ? mouth rinse  15 mL Mouth Rinse BID  ? metoprolol tartrate  100 mg Oral BID  ? mometasone-formoterol  2 puff Inhalation BID  ? multivitamin with minerals  1 tablet Oral Daily  ? multivitamin-lutein  1 capsule Oral Daily  ? mupirocin ointment  1 application. Nasal BID  ? pantoprazole  40 mg  Oral Daily  ? polyvinyl alcohol  2 drop Both Eyes Daily  ? predniSONE  5 mg Oral Q breakfast  ? Rivaroxaban  20 mg Oral q1800  ? saccharomyces boulardii  250 mg Oral BID  ? senna  1 tablet Oral Daily  ? sodium chloride flush  3 mL Intravenous Q12H  ? zinc sulfate  220 mg Oral Daily  ? ?Continuous Infusions: ? sodium chloride    ? cefTRIAXone (ROCEPHIN)  IV Stopped (06/16/21 1652)  ? diltiazem (CARDIZEM) infusion Stopped (06/16/21 1028)  ? ?PRN Meds:.sodium chloride, acetaminophen **OR** acetaminophen, albuterol, diclofenac Sodium, guaiFENesin-dextromethorphan, metoprolol tartrate, Muscle Rub, naLOXone (NARCAN)  injection, nitroGLYCERIN, ondansetron **OR** ondansetron (ZOFRAN) IV, oxyCODONE, polyethylene glycol, sodium chloride flush ? ? ? ?Objective: ?Weight change:  ? ?Intake/Output Summary (Last 24 hours) at 06/16/2021 1921 ?Last data filed at 06/16/2021 1832 ?Gross per 24 hour  ?Intake 1734.21 ml  ?Output 825 ml  ?Net 909.21 ml  ? ?Blood pressure (!) 123/55, pulse 85, temperature 98.2 ?F (36.8 ?C), temperature source Axillary, resp. rate 18, height '5\' 7"'$  (1.702 m), weight 71.1 kg, SpO2 94 %. ?Temp:  [97.5 ?F (  36.4 ?C)-100.9 ?F (38.3 ?C)] 98.2 ?F (36.8 ?C) (03/15 1604) ?Pulse Rate:  [29-117] 85 (03/15 1800) ?Resp:  [13-26] 18 (03/15 1800) ?BP: (111-177)/(53-147) 123/55 (03/15 1800) ?SpO2:  [93 %-100 %] 94 % (03/15 1800) ? ?Physical Exam: ?Physical Exam ?HENT:  ?   Head: Normocephalic and atraumatic.  ?   Nose: Nose normal.  ?Eyes:  ?   General:     ?   Right eye: No discharge.     ?   Left eye: No discharge.  ?   Extraocular Movements: Extraocular movements intact.  ?Cardiovascular:  ?   Rate and Rhythm: Tachycardia present.  ?Pulmonary:  ?   Effort: No respiratory distress.  ?   Breath sounds: No wheezing.  ?Abdominal:  ?   General: There is no distension.  ?Skin: ?   General: Skin is warm.  ?   Coloration: Skin is pale.  ?Neurological:  ?   Mental Status: She is disoriented.  ?Psychiatric:     ?   Attention and  Perception: She is inattentive.     ?   Speech: She is noncommunicative.     ?   Cognition and Memory: Cognition is impaired. Memory is impaired. She exhibits impaired recent memory and impaired remote memory.  ?  ?I was able to examine via video feed her feet and did not find the pathology on her toes to be significant for an infectious source of her bacteremia. ? ?My understanding is her stage II decubitus ulcers also did not appear infected upon discussion with the nurse.  She also apparently does not have any evidence of phlebitis. ? ? ?CBC: ? ? ? ?BMET ?Recent Labs  ?  06/15/21 ?7425 06/16/21 ?0350  ?NA 139 139  ?K 3.7 4.0  ?CL 104 106  ?CO2 25 25  ?GLUCOSE 88 105*  ?BUN 14 12  ?CREATININE 0.79 0.72  ?CALCIUM 8.0* 8.2*  ? ? ? ?Liver Panel ? ?No results for input(s): PROT, ALBUMIN, AST, ALT, ALKPHOS, BILITOT, BILIDIR, IBILI in the last 72 hours. ? ? ? ? ?Sedimentation Rate ?No results for input(s): ESRSEDRATE in the last 72 hours. ?C-Reactive Protein ?No results for input(s): CRP in the last 72 hours. ? ?Micro Results: ?Recent Results (from the past 720 hour(s))  ?Resp Panel by RT-PCR (Flu A&B, Covid) Nasopharyngeal Swab     Status: None  ? Collection Time: 05/20/21 10:10 PM  ? Specimen: Nasopharyngeal Swab; Nasopharyngeal(NP) swabs in vial transport medium  ?Result Value Ref Range Status  ? SARS Coronavirus 2 by RT PCR NEGATIVE NEGATIVE Final  ?  Comment: (NOTE) ?SARS-CoV-2 target nucleic acids are NOT DETECTED. ? ?The SARS-CoV-2 RNA is generally detectable in upper respiratory ?specimens during the acute phase of infection. The lowest ?concentration of SARS-CoV-2 viral copies this assay can detect is ?138 copies/mL. A negative result does not preclude SARS-Cov-2 ?infection and should not be used as the sole basis for treatment or ?other patient management decisions. A negative result may occur with  ?improper specimen collection/handling, submission of specimen other ?than nasopharyngeal swab, presence of  viral mutation(s) within the ?areas targeted by this assay, and inadequate number of viral ?copies(<138 copies/mL). A negative result must be combined with ?clinical observations, patient history, and epidemiological ?information. The expected result is Negative. ? ?Fact Sheet for Patients:  ?EntrepreneurPulse.com.au ? ?Fact Sheet for Healthcare Providers:  ?IncredibleEmployment.be ? ?This test is no t yet approved or cleared by the [place] and  ?has been authorized for detection and/or  diagnosis of SARS-CoV-2 by ?FDA under an Emergency Use Authorization (EUA). This EUA will remain  ?in effect (meaning this test can be used) for the duration of the ?COVID-19 declaration under Section 564(b)(1) of the Act, 21 ?U.S.C.section 360bbb-3(b)(1), unless the authorization is terminated  ?or revoked sooner.  ? ? ?  ? Influenza A by PCR NEGATIVE NEGATIVE Final  ? Influenza B by PCR NEGATIVE NEGATIVE Final  ?  Comment: (NOTE) ?The Xpert Xpress SARS-CoV-2/FLU/RSV plus assay is intended as an aid ?in the diagnosis of influenza from Nasopharyngeal swab specimens and ?should not be used as a sole basis for treatment. Nasal washings and ?aspirates are unacceptable for Xpert Xpress SARS-CoV-2/FLU/RSV ?testing. ? ?Fact Sheet for Patients: ?EntrepreneurPulse.com.au ? ?Fact Sheet for Healthcare Providers: ?IncredibleEmployment.be ? ?This test is not yet approved or cleared by the [place] FDA and ?has been authorized for detection and/or diagnosis of SARS-CoV-2 by ?FDA under an Emergency Use Authorization (EUA). This EUA will remain ?in effect (meaning this test can be used) for the duration of the ?COVID-19 declaration under Section 564(b)(1) of the Act, 21 U.S.C. ?section 360bbb-3(b)(1), unless the authorization is terminated or ?revoked. ? ?Performed at [facility], [address] [place], [place] [address] ?  ?Blood Culture (routine x 2)      Status: Abnormal  ? Collection Time: 05/20/21 10:12 PM  ? Specimen: Right Antecubital; Blood  ?Result Value Ref Range Status  ? Specimen Description   Final  ?  RIGHT ANTECUBITAL ?Performed at [facility],

## 2021-06-16 NOTE — Consult Note (Addendum)
[place] Nurse wound consult note ?Consultation was completed by review of records, images and assistance from the bedside nurse/clinical staff.  ? ?Reason for Consult: management of LE wounds. Patient followed in the outpatient wound care center. Last seen 06/09/21, just prior to admission ?Hx of DM, PVD, CHF, RA ?Wound type: neuropathic foot ulcerations; venous stasis ulcerations; pressure injury left heel  ?Pressure Injury POA: Yes ?Measurement: wounds described fully in [facility] notes ?Per [facility] notes ?#1 R ant. Lower leg ?#2 R lat. Lower leg ?#3 L ant. Lower leg ?#4 L medial lower leg ?#5 left 3rd toe ?#6 left great toe ?#7 left 4th toe ?Incidental mention of Stage 2 pressure injury on the left heel; no mention of treatment, apparently sheer to Stage 2 fluid filled blister while in the clinic (rubbed open) ?Wound bed: wounds are reported to be clean, toe wounds post debridement are to the bone.  ?Drainage (amount, consistency, odor) see nursing flow sheet ?Periwound: edema  ?Dressing procedure/placement/frequency:  ?1. Cleanse bilateral LE wounds with saline, pat dry  ?2. Cut to fit small pieces of silver hydrofiber (Aquacel Ag+) [place] # [id number] and place over each wound, cover with dry dressing and secure with kerlix  ?3. Wrap with kerlix from toes to knees bilaterally, secure with ACE wrap  ?Change every other day beginning 06/16/21. ? ?Patient does have an appointment for evaluation per [facility], not sure when that appt will occur. ? ? ? ?Re consult if needed, will not follow at this time. ?Thanks ? D[facility] MSN, RN,CWOCN, CNS, CWON-AP [phone])  ? ?

## 2021-06-16 NOTE — Progress Notes (Signed)
? ?Progress Note ? ?Patient Name: [name] ?Date of Encounter: 06/16/2021 ? ?[facility] Cardiologist: Kirk Ruths, MD  ? ?Subjective  ? ?No events [place].  ? ?Inpatient Medications  ?  ?Scheduled Meds: ? (feeding supplement) PROSource Plus  30 mL Oral q AM  ? vitamin C  500 mg Oral Daily  ? atorvastatin  20 mg Oral Daily  ? buPROPion ER  100 mg Oral BID  ? Chlorhexidine Gluconate Cloth  6 each Topical Q0600  ? cycloSPORINE  1 drop Both Eyes BID  ? diltiazem  60 mg Oral Q6H  ? gabapentin  300 mg Oral TID  ? icosapent Ethyl  1 g Oral q morning  ? insulin aspart  0-15 Units Subcutaneous TID WC  ? insulin aspart  0-5 Units Subcutaneous QHS  ? ketotifen  1 drop Both Eyes BID  ? loratadine  10 mg Oral Daily  ? magnesium oxide  400 mg Oral Daily  ? mouth rinse  15 mL Mouth Rinse BID  ? metoprolol tartrate  100 mg Oral BID  ? mometasone-formoterol  2 puff Inhalation BID  ? multivitamin with minerals  1 tablet Oral Daily  ? multivitamin-lutein  1 capsule Oral Daily  ? mupirocin ointment  1 application. Nasal BID  ? pantoprazole  40 mg Oral Daily  ? polyvinyl alcohol  2 drop Both Eyes Daily  ? predniSONE  5 mg Oral Q breakfast  ? Rivaroxaban  20 mg Oral q1800  ? saccharomyces boulardii  250 mg Oral BID  ? senna  1 tablet Oral Daily  ? sodium chloride flush  3 mL Intravenous Q12H  ? zinc sulfate  220 mg Oral Daily  ? ?Continuous Infusions: ? sodium chloride    ? cefTRIAXone (ROCEPHIN)  IV 2 g (06/15/21 1702)  ? diltiazem (CARDIZEM) infusion 5 mg/hr (06/16/21 0754)  ? lactated ringers 75 mL/hr at 06/15/21 1050  ? ?PRN Meds: ?sodium chloride, acetaminophen **OR** acetaminophen, albuterol, diclofenac Sodium, guaiFENesin-dextromethorphan, metoprolol tartrate, Muscle Rub, nitroGLYCERIN, ondansetron **OR** ondansetron (ZOFRAN) IV, oxyCODONE, polyethylene glycol, sodium chloride flush  ? ?Vital Signs  ?  ?Vitals:  ? 06/16/21 0400 06/16/21 0500 06/16/21 0600 06/16/21 0700  ?BP: (!) 149/79 (!) 163/75 (!) 146/70    ?Pulse: 99 93 (!) 115   ?Resp: 16 (!) 21 (!) 21   ?Temp:    98.2 ?F (36.8 ?C)  ?TempSrc:    Axillary  ?SpO2: 98% 98% 99%   ?Weight:      ?Height:      ? ? ?Intake/Output Summary (Last 24 hours) at 06/16/2021 0915 ?Last data filed at 06/16/2021 0600 ?Gross per 24 hour  ?Intake 2667.61 ml  ?Output 1050 ml  ?Net 1617.61 ml  ? ?Last 3 Weights 06/15/2021 06/11/2021 06/10/2021  ?Weight (lbs) 156 lb 12 oz 167 lb 1.7 oz 170 lb 3.1 oz  ?Weight (kg) 71.1 kg 75.8 kg 77.2 kg  ?   ? ?Telemetry  ?  ?Afib elevated rates - Personally Reviewed ? ?ECG  ?  ?N/a - Personally Reviewed ? ?Physical Exam  ? ?GEN: No acute distress.   ?Neck: No JVD ?Cardiac: irreg ?Respiratory: mild coarse breath sounds ?GI: Soft, nontender, non-distended  ?MS: No edema; No deformity. ?Neuro:  Nonfocal  ?Psych: Not able to assess, patient altered, does not answer questions ? ?Labs  ?  ?High Sensitivity Troponin:   ?Recent Labs  ?Lab 05/20/21 ?2212 05/21/21 ?0017 06/10/21 ?1829 06/10/21 ?0745  ?TROPONINIHS 21* 19* 10 9  ?   ?Chemistry ?Recent  Labs  ?Lab 06/10/21 ?0609 06/11/21 ?0404 06/14/21 ?0423 06/15/21 ?8937 06/16/21 ?0350  ?NA 144   < > 139 139 139  ?K 3.1*   < > 3.9 3.7 4.0  ?CL 105   < > 102 104 106  ?CO2 29   < > '27 25 25  '$ ?GLUCOSE 100*   < > 127* 88 105*  ?BUN 15   < > '17 14 12  '$ ?CREATININE 0.64   < > 0.94 0.79 0.72  ?CALCIUM 8.8*   < > 8.4* 8.0* 8.2*  ?MG  --    < > 1.7 1.8 1.6*  ?PROT 6.7  --   --   --   --   ?ALBUMIN 3.0*  --   --   --   --   ?AST 16  --   --   --   --   ?ALT 17  --   --   --   --   ?ALKPHOS 148*  --   --   --   --   ?BILITOT 1.0  --   --   --   --   ?GFRNONAA >60   < > >60 >60 >60  ?ANIONGAP 10   < > '10 10 8  '$ ? < > = values in this interval not displayed.  ?  ?Lipids No results for input(s): CHOL, TRIG, HDL, LABVLDL, LDLCALC, CHOLHDL in the last 168 hours.  ?Hematology ?Recent Labs  ?Lab 06/14/21 ?0423 06/15/21 ?3428 06/16/21 ?0350  ?WBC 9.5 9.9 8.0  ?RBC 4.11 3.52* 3.50*  ?HGB 10.8* 9.4* 8.8*  ?HCT 37.0 31.2* 30.9*  ?MCV 90.0 88.6  88.3  ?MCH 26.3 26.7 25.1*  ?MCHC 29.2* 30.1 28.5*  ?RDW 20.3* 20.0* 20.2*  ?PLT 272 234 228  ? ?Thyroid  ?Recent Labs  ?Lab 06/10/21 ?0745  ?TSH 2.266  ?  ?BNP ?Recent Labs  ?Lab 06/10/21 ?0609  ?BNP 644.0*  ?  ?DDimer No results for input(s): DDIMER in the last 168 hours.  ? ?Radiology  ?  ?DG CHEST PORT 1 VIEW ? ?Result Date: 06/14/2021 ?CLINICAL DATA:  A 76 year old female presents for evaluation of fever. EXAM: PORTABLE CHEST 1 VIEW COMPARISON:  June 10, 2021. FINDINGS: Image is rotated to the RIGHT. Accounting for this cardiomediastinal contours and hilar structures are stable Increasing graded opacity RIGHT and LEFT chest RIGHT greater than LEFT. Obscured medial RIGHT hemidiaphragm and obscured margin of LEFT hemidiaphragm. No visible pneumothorax. Osteopenia.  No acute skeletal process on limited evaluation. IMPRESSION: Bilateral effusions RIGHT greater than LEFT, moderate on the RIGHT. Bibasilar airspace disease may represent volume loss or developing infection. Would also correlate with any signs of heart failure. Electronically Signed   By: Zetta Bills M.D.   On: 06/14/2021 11:12   ? ?Cardiac Studies  ? ? ?Patient Profile  ?   ?75 y.o. female with hx of permanent atrial fibrillation, history of GIB secondary to AVM's, HFpEF, HTN, HLD, Stage 3 CKD and prior CVA who presented after fall found to be in Afib wit RVR for which Cardiology was consulted.  ? ?Assessment & Plan  ?  ?Afib with RVR ?-history of permanent afib, presented with afib with RVR after fall at home and UTI ?- started on dilt gtt initially, then oral with plans to wean drip. ?-currently on diltiazem '60mg'$  tid, lopressor '100mg'$  bid, dilt gtt at 5. Actually remains hypertensive on high doses of av nodal agents ?- on xarelto '20mg'$  daily for stroke preveneion, CrCl >50 so on  correct dose ? ?- increase oral ditl to '90mg'$  tid, wean dilt gtt to off today ? ?2. HFpEF ?- TTE 05/2021 EF was at 65-70% with no regional WMA.  ?- CXR bilateral effusions  R>L, moderate right. Bibasilar airspace diseae. BNP 644 (760 3 weeks ago) ?- off IV lasix over last few days. Weights appear inaccurate. Add BNP to AM labs. Difficult to assess volume status by exam as patient is altered and does not cooperate with exam. Repeate PCXR ? ?3. UTI ?- per primary team ? ?4, Anemia ?- per primary team ? ?For questions or updates, please contact [place] ?Please consult [email or web address] for contact info under  ? ?  ?   ?Signed, ?Carlyle Dolly, MD  ?06/16/2021, 9:15 AM   ? ?

## 2021-06-16 NOTE — Progress Notes (Signed)
?PROGRESS NOTE ? ? ? [name]  KDX:[id number] DOB: 06-22-1945 DOA: 06/10/2021 ?PCP: [name]., PA-C ? ? ?Brief Narrative:  ?Per HPI: ?[name] is a 76 y.o. female with medical history significant of paroxysmal atrial fibrillation on Xarelto for anticoagulation, chronic diastolic heart failure, dyslipidemia, hypertension, type 2 diabetes, prior CVA, peripheral vascular disease, rheumatoid arthritis, and chronic lower extremity wounds who presented to the ED after a fall at her SNF that was unwitnessed.  Upon arrival to the ED, she was found to be in atrial fibrillation with RVR and was also noted to be quite hypertensive.  She denies any chest pain, palpitations, or shortness of breath and complains of her left lower extremity erythema.  She appears to have been seen by wound care on 3/8 with debridements done at that time with no concern for cellulitis. ?  ?She was recently hospitalized for iliopsoas abscess that was drained and discharged on 2/21.  She has been completing her antibiotics at SNF. ?  ?In the ED, CT head was performed with no acute findings and she was noted to have atrial fibrillation with RVR with heart rate in the 150 bpm range.  She has been started on Cardizem drip for heart rate control. ? ?06/11/21: Patient has been admitted for heart rate control with noted atrial fibrillation with RVR.  She continues to remain on Cardizem drip.  Cardiology has assessed patient with recommendations to change Cardizem dosing today.  Electrolyte abnormalities to be corrected. ? ?06/12/21: Patient continues to have elevated heart rates requiring further adjustment of oral Cardizem dose.  Remains off Cardizem drip at this time.  We will also administer 1 dose of IV Lasix per cardiology recommendations on 3/10. ? ?06/13/21: Heart rates are better controlled with increased dose of Cardizem to 90 mg every 6 hours.  Plan to administer 3 more doses and then anticipate cardiology follow-up in a.m. with  likely consolidation of dose versus increase after further evaluation. ? ?06/14/21: Heart rates demonstrate worsening control and will require reinitiation of Cardizem drip.  Cardiology following with plans to load digoxin today.  Initially noted to have increased lactic acid levels which are now downtrending.  Evaluating for source of infection. ? ?06/15/21: Patient noted to have UTI with Proteus bacteremia and was started on Rocephin empirically 3/13.  She appears to be showing signs of improvement and will attempt to be weaned off Cardizem drip today per cardiology. ? ?06/16/21: Further cultures pending, appreciate ID consultation.  Patient being weaned off Cardizem drip with increase in oral Cardizem dosing.  Discontinue LR fluid.  Chest x-ray and BNP pending with possible need for further diuresis.  ? ? ?Assessment & Plan: ?  ?Principal Problem: ?  Atrial fibrillation with RVR ([place]) ?Active Problems: ?  Hypertension ?  Type 2 diabetes mellitus with hyperlipidemia ([place]) ?  Rheumatoid arthritis ([place]) ?  Peripheral vascular disease ([place]) ?  Chronic diastolic (congestive) heart failure (HCC) ?  Iliopsoas abscess on left [facility]) ?  Hypokalemia ?  Contusion of scalp ?  Fever ?  Delirium ?  Agitation ?  Bacteremia due to Proteus species ?  Gram-negative bacteremia ?  Streptococcal bacteremia ?  Cellulitis of left lower extremity ? ?Assessment and Plan: ? ? ?Atrial fibrillation with RVR ([place]) ?Cardizem drip to be weaned off with p.o. Cardizem 90 mg every 6 hours reinitiated today ?TSH within normal limits ?Recent 2D echocardiogram noted on 2/23 and will avoid for now ?IV metoprolol as needed for elevated  heart rates ?Likely will require increasing doses of Cardizem upon discharge ?Continue anticoagulation with Xarelto ?Appreciate cardiology recommendations-ongoing ?  ?Proteus bacteremia likely secondary to UTI ?Appeciate ID consultation; monitor cultures ?Continue treatment with Rocephin ?Further sensitivities  pending ?Procalcitonin elevated ?Monitor CBC ?  ?Iliopsoas abscess on left [facility]) ?Patient finished up [place] on first day of admission ?May require repeat imaging if true source could not be identified ?  ?Chronic diastolic (congestive) heart failure (HCC) ?DC LR IVF ?Repeat CXR and BNP pending  ?2D echocardiogram recently performed ?  ?Peripheral vascular disease ([place]) ?Noted to have wounds on lower extremities which is being followed by outpatient wound care 3/8 ?  ?Rheumatoid arthritis ([place]) ?Continue prednisone ?  ?Type 2 diabetes mellitus with hyperlipidemia ([place]) ?SSI while inpatient ?Resume statin ?  ?Hypertension ?Noted to have elevated blood pressures on admission, monitor ?She had improved with increasing doses of Cardizem as well as dosing of Lasix ?  ?Lactic acidosis ?Currently improved ?Related to UTI above with Proteus bacteremia ?  ?DVT prophylaxis: Xarelto ?Code Status: DNR ?Family Communication: Discussed with son on phone 3/14 ?Disposition Plan:  ?Status is: Inpatient ?Remains inpatient appropriate because: Need for IV Cardizem drip for heart rate control. ?  ?  ?Nutritional Assessment: ?  ?The patient?s BMI is: Body mass index is 26.17 kg/m?[name]. ?  ?Seen by dietician.  I agree with the assessment and plan as outlined below: ?  ?Nutrition Status: ?Skin Assessment: ?  ?I have examined the patient?s skin and I agree with the wound assessment as performed by the wound care RN as outlined below: ?  ?Pressure Injury 05/21/21 Buttocks Right Stage 2 -  Partial thickness loss of dermis presenting as a shallow open injury with a red, pink wound bed without slough. (Active)  ?05/21/21 1131  ?Location: Buttocks  ?Location Orientation: Right  ?Staging: Stage 2 -  Partial thickness loss of dermis presenting as a shallow open injury with a red, pink wound bed without slough.  ?Wound Description (Comments):   ?Present on Admission: Yes  ?   ?Pressure Injury 05/21/21 Buttocks Right Stage 2 -  Partial thickness loss  of dermis presenting as a shallow open injury with a red, pink wound bed without slough. (Active)  ?05/21/21 1132  ?Location: Buttocks  ?Location Orientation: Right  ?Staging: Stage 2 -  Partial thickness loss of dermis presenting as a shallow open injury with a red, pink wound bed without slough.  ?Wound Description (Comments):   ?Present on Admission: Yes  ?   ?Pressure Injury 06/10/21 Heel Left Stage 2 -  Partial thickness loss of dermis presenting as a shallow open injury with a red, pink wound bed without slough. (Active)  ?06/10/21 1435  ?Location: Heel  ?Location Orientation: Left (Heel)  ?Staging: Stage 2 -  Partial thickness loss of dermis presenting as a shallow open injury with a red, pink wound bed without slough.  ?Wound Description (Comments):   ?Present on Admission: Yes  ?  ?  ?Consultants:  ?Cardiology ?ID ?  ?Procedures:  ?See below ? ?Antimicrobials:  ?Anti-infectives (From admission, onward)  ? ? Start     Dose/Rate Route Frequency Ordered Stop  ? 06/14/21 1715  cefTRIAXone (ROCEPHIN) 2 g in sodium chloride 0.9 % 100 mL IVPB       ? 2 g ?200 mL/hr over 30 Minutes Intravenous Every 24 hours 06/14/21 1617    ? 06/10/21 2200  cefadroxil (DURICEF) capsule 1,000 mg  Status:  Discontinued       ?  1,000 mg Oral 2 times daily 06/10/21 1012 06/11/21 0818  ? 06/10/21 1000  cefadroxil (DURICEF) capsule 1,000 mg  Status:  Discontinued       ? 1,000 mg Oral 2 times daily 06/10/21 0943 06/10/21 1012  ? ?  ? ?Subjective: ?Patient seen and evaluated today with no new acute complaints or concerns. No acute concerns or events noted overnight.  She continues to have elevated heart rates this morning. ? ?Objective: ?Vitals:  ? 06/16/21 0500 06/16/21 0600 06/16/21 0700 06/16/21 0800  ?BP: (!) 163/75 (!) 146/70 (!) 168/109 (!) 177/83  ?Pulse: 93 (!) 115 (!) 101 (!) 112  ?Resp: (!) 21 (!) 21 13 (!) 24  ?Temp:   98.2 ?F (36.8 ?C)   ?TempSrc:   Axillary   ?SpO2: 98% 99% 97% 95%  ?Weight:      ?Height:       ? ? ?Intake/Output Summary (Last 24 hours) at 06/16/2021 0958 ?Last data filed at 06/16/2021 0600 ?Gross per 24 hour  ?Intake 2667.61 ml  ?Output 1050 ml  ?Net 1617.61 ml  ? ?Filed Weights  ? 06/10/21 1441 06/11/21 0437 06/15/21

## 2021-06-16 NOTE — Progress Notes (Signed)
Patient found to be very lethargic, nonverbal and drooling in bed.  Patient opens eyes to repeated noxious stimuli and withdraws all four extremities to painful stimuli.  Pupils 3 brisk, unable to follow commands, vitals signs stable -  T 98.4 axillary, BP 111/61, HR 90s-100s afib with PVCs, RR 19, O2 100% on 2L [place].  Hospitalist notified via secure chat.  Patient last received medications at 0745.  Received 7.'5mg'$  of oxycodone at 0745.   ?

## 2021-06-17 ENCOUNTER — Ambulatory Visit: Payer: Medicare Other | Admitting: Gastroenterology

## 2021-06-17 DIAGNOSIS — I5032 Chronic diastolic (congestive) heart failure: Secondary | ICD-10-CM | POA: Diagnosis not present

## 2021-06-17 DIAGNOSIS — R7881 Bacteremia: Secondary | ICD-10-CM | POA: Diagnosis not present

## 2021-06-17 DIAGNOSIS — G9340 Encephalopathy, unspecified: Secondary | ICD-10-CM | POA: Diagnosis not present

## 2021-06-17 DIAGNOSIS — K6812 Psoas muscle abscess: Secondary | ICD-10-CM | POA: Diagnosis not present

## 2021-06-17 DIAGNOSIS — I4891 Unspecified atrial fibrillation: Secondary | ICD-10-CM | POA: Diagnosis not present

## 2021-06-17 LAB — CBC
HCT: 32.8 % — ABNORMAL LOW (ref 36.0–46.0)
Hemoglobin: 9.6 g/dL — ABNORMAL LOW (ref 12.0–15.0)
MCH: 26.2 pg (ref 26.0–34.0)
MCHC: 29.3 g/dL — ABNORMAL LOW (ref 30.0–36.0)
MCV: 89.6 fL (ref 80.0–100.0)
Platelets: 238 10*3/uL (ref 150–400)
RBC: 3.66 MIL/uL — ABNORMAL LOW (ref 3.87–5.11)
RDW: 20.3 % — ABNORMAL HIGH (ref 11.5–15.5)
WBC: 9.5 10*3/uL (ref 4.0–10.5)
nRBC: 0 % (ref 0.0–0.2)

## 2021-06-17 LAB — CULTURE, BLOOD (ROUTINE X 2)
Culture: NO GROWTH
Culture: NO GROWTH
Special Requests: ADEQUATE

## 2021-06-17 LAB — BASIC METABOLIC PANEL
Anion gap: 8 (ref 5–15)
BUN: 12 mg/dL (ref 8–23)
CO2: 26 mmol/L (ref 22–32)
Calcium: 8.5 mg/dL — ABNORMAL LOW (ref 8.9–10.3)
Chloride: 104 mmol/L (ref 98–111)
Creatinine, Ser: 0.69 mg/dL (ref 0.44–1.00)
GFR, Estimated: >60 mL/min (ref >60)
Glucose, Bld: 91 mg/dL (ref 70–99)
Potassium: 3.7 mmol/L (ref 3.5–5.1)
Sodium: 138 mmol/L (ref 135–145)

## 2021-06-17 LAB — BRAIN NATRIURETIC PEPTIDE: B Natriuretic Peptide: 549 pg/mL — ABNORMAL HIGH (ref 0.0–100.0)

## 2021-06-17 LAB — GLUCOSE, CAPILLARY
Glucose-Capillary: 94 mg/dL (ref 70–99)
Glucose-Capillary: 98 mg/dL (ref 70–99)
Glucose-Capillary: 133 mg/dL — ABNORMAL HIGH (ref 70–99)
Glucose-Capillary: 108 mg/dL — ABNORMAL HIGH (ref 70–99)

## 2021-06-17 LAB — URINE CULTURE: Culture: >=100000 — AB

## 2021-06-17 LAB — MAGNESIUM: Magnesium: 1.7 mg/dL (ref 1.7–2.4)

## 2021-06-17 MED ORDER — SODIUM CHLORIDE 0.9 % IV SOLN
1.0000 g | Freq: Three times a day (TID) | INTRAVENOUS | Status: DC
  Administered 2021-06-17 – 2021-06-21 (×14): 1 g via INTRAVENOUS
  Filled 2021-06-17 (×14): qty 20

## 2021-06-17 MED ORDER — FUROSEMIDE 10 MG/ML IJ SOLN
40.0000 mg | Freq: Two times a day (BID) | INTRAMUSCULAR | Status: AC
  Administered 2021-06-17 (×2): 40 mg via INTRAVENOUS
  Filled 2021-06-17 (×2): qty 4

## 2021-06-17 MED ORDER — DILTIAZEM HCL 60 MG PO TABS
120.0000 mg | ORAL_TABLET | Freq: Four times a day (QID) | ORAL | Status: DC
Start: 2021-06-17 — End: 2021-06-21
  Administered 2021-06-17 – 2021-06-21 (×17): 120 mg via ORAL
  Filled 2021-06-17 (×17): qty 2

## 2021-06-17 MED ORDER — ENSURE ENLIVE PO LIQD
237.0000 mL | Freq: Two times a day (BID) | ORAL | Status: DC
  Administered 2021-06-18 – 2021-06-20 (×4): 237 mL via ORAL

## 2021-06-17 NOTE — Progress Notes (Signed)
? ?Progress Note ? ?Patient Name: [name] ?Date of Encounter: 06/17/2021 ? ?[facility] Cardiologist: Kirk Ruths, MD  ? ?Subjective  ? ?No acute events ? ?Inpatient Medications  ?  ?Scheduled Meds: ? (feeding supplement) PROSource Plus  30 mL Oral q AM  ? vitamin C  500 mg Oral Daily  ? atorvastatin  20 mg Oral Daily  ? buPROPion ER  100 mg Oral BID  ? Chlorhexidine Gluconate Cloth  6 each Topical Q0600  ? cycloSPORINE  1 drop Both Eyes BID  ? diltiazem  90 mg Oral Q6H  ? gabapentin  300 mg Oral TID  ? icosapent Ethyl  1 g Oral q morning  ? insulin aspart  0-15 Units Subcutaneous TID WC  ? insulin aspart  0-5 Units Subcutaneous QHS  ? ketotifen  1 drop Both Eyes BID  ? loratadine  10 mg Oral Daily  ? magnesium oxide  400 mg Oral Daily  ? mouth rinse  15 mL Mouth Rinse BID  ? metoprolol tartrate  100 mg Oral BID  ? mometasone-formoterol  2 puff Inhalation BID  ? multivitamin with minerals  1 tablet Oral Daily  ? multivitamin-lutein  1 capsule Oral Daily  ? mupirocin ointment  1 application. Nasal BID  ? pantoprazole  40 mg Oral Daily  ? polyvinyl alcohol  2 drop Both Eyes Daily  ? predniSONE  5 mg Oral Q breakfast  ? Rivaroxaban  20 mg Oral q1800  ? saccharomyces boulardii  250 mg Oral BID  ? senna  1 tablet Oral Daily  ? sodium chloride flush  3 mL Intravenous Q12H  ? zinc sulfate  220 mg Oral Daily  ? ?Continuous Infusions: ? sodium chloride    ? cefTRIAXone (ROCEPHIN)  IV Stopped (06/16/21 1652)  ? diltiazem (CARDIZEM) infusion Stopped (06/16/21 1028)  ? ?PRN Meds: ?sodium chloride, acetaminophen **OR** acetaminophen, albuterol, diclofenac Sodium, guaiFENesin-dextromethorphan, metoprolol tartrate, Muscle Rub, naLOXone (NARCAN)  injection, nitroGLYCERIN, ondansetron **OR** ondansetron (ZOFRAN) IV, oxyCODONE, polyethylene glycol, sodium chloride flush  ? ?Vital Signs  ?  ?Vitals:  ? 06/17/21 0625 06/17/21 0700 06/17/21 0749 06/17/21 0800  ?BP: (!) 150/77 (!) 152/79  124/87  ?Pulse: (!) 113 66  (!) 117   ?Resp:    (!) 26  ?Temp:   (!) 96.9 ?F (36.1 ?C)   ?TempSrc:   Axillary   ?SpO2:  96%  94%  ?Weight:      ?Height:      ? ? ?Intake/Output Summary (Last 24 hours) at 06/17/2021 0845 ?Last data filed at 06/16/2021 2000 ?Gross per 24 hour  ?Intake 654.31 ml  ?Output 325 ml  ?Net 329.31 ml  ? ?Last 3 Weights 06/15/2021 06/11/2021 06/10/2021  ?Weight (lbs) 156 lb 12 oz 167 lb 1.7 oz 170 lb 3.1 oz  ?Weight (kg) 71.1 kg 75.8 kg 77.2 kg  ?   ? ?Telemetry  ?  ?Afib variable rates - Personally Reviewed ? ?ECG  ?  ?N/a - Personally Reviewed ? ?Physical Exam  ? ?GEN: No acute distress.   ?Neck: No JVD ?Cardiac: irreg, tachy ?Respiratory: Clear to auscultation bilaterally. ?GI: Soft, nontender, non-distended  ?MS: No edema; No deformity. ?Neuro:  Nonfocal  ?Psych: unable to assess, altered this AM ? ?Labs  ?  ?High Sensitivity Troponin:   ?Recent Labs  ?Lab 05/20/21 ?2212 05/21/21 ?0017 06/10/21 ?4098 06/10/21 ?0745  ?TROPONINIHS 21* 19* 10 9  ?   ?Chemistry ?Recent Labs  ?Lab 06/15/21 ?1191 06/16/21 ?0350 06/17/21 ?0353  ?NA  139 139 138  ?K 3.7 4.0 3.7  ?CL 104 106 104  ?CO2 '25 25 26  '$ ?GLUCOSE 88 105* 91  ?BUN '14 12 12  '$ ?CREATININE 0.79 0.72 0.69  ?CALCIUM 8.0* 8.2* 8.5*  ?MG 1.8 1.6* 1.7  ?GFRNONAA >60 >60 >60  ?ANIONGAP '10 8 8  '$ ?  ?Lipids No results for input(s): CHOL, TRIG, HDL, LABVLDL, LDLCALC, CHOLHDL in the last 168 hours.  ?Hematology ?Recent Labs  ?Lab 06/15/21 ?0350 06/16/21 ?0350 06/17/21 ?0353  ?WBC 9.9 8.0 9.5  ?RBC 3.52* 3.50*  3.38* 3.66*  ?HGB 9.4* 8.8* 9.6*  ?HCT 31.2* 30.9* 32.8*  ?MCV 88.6 88.3 89.6  ?MCH 26.7 25.1* 26.2  ?MCHC 30.1 28.5* 29.3*  ?RDW 20.0* 20.2* 20.3*  ?PLT 234 228 238  ? ?Thyroid No results for input(s): TSH, FREET4 in the last 168 hours.  ?BNP ?Recent Labs  ?Lab 06/17/21 ?0353  ?BNP 549.0*  ?  ?DDimer No results for input(s): DDIMER in the last 168 hours.  ? ?Radiology  ?  ?CT ABDOMEN PELVIS W CONTRAST ? ?Result Date: 06/16/2021 ?CLINICAL DATA:  Fever and sepsis. EXAM: CT ABDOMEN AND PELVIS  WITH CONTRAST TECHNIQUE: Multidetector CT imaging of the abdomen and pelvis was performed using the standard protocol following bolus administration of intravenous contrast. RADIATION DOSE REDUCTION: This exam was performed according to the departmental dose-optimization program which includes automated exposure control, adjustment of the mA and/or kV according to patient size and/or use of iterative reconstruction technique. CONTRAST:  139m OMNIPAQUE IOHEXOL 300 MG/ML  SOLN COMPARISON:  05/21/2021 FINDINGS: Lower chest: Interval progression of bibasilar collapse/consolidative opacity in the dependent lower lobes. Moderate bilateral pleural effusions evident. Hepatobiliary: Subtle nodularity of liver contour raises the question of cirrhosis. No suspicious focal abnormality within the liver parenchyma. There is no evidence for gallstones, gallbladder wall thickening, or pericholecystic fluid. No intrahepatic or extrahepatic biliary dilation. Pancreas: No focal mass lesion. No dilatation of the main duct. No intraparenchymal cyst. No peripancreatic edema. Spleen: No splenomegaly. No focal mass lesion. Adrenals/Urinary Tract: No adrenal nodule or mass. Left kidney unremarkable. Stable 2.9 cm lesion upper interpolar right kidney with attenuation too high to be a simple cyst. 13 mm low-density lesion posterior interpolar right kidney is likely a cyst. Focal cortical scarring noted lower pole right kidney. Additional tiny hypoattenuating lesions in both kidneys are too small to characterize. No evidence for hydroureter. Distal ureters and portions of the urinary bladder are obscured by beam hardening artifact from bilateral hip replacement. Stomach/Bowel: Stomach is nondistended. Duodenum is normally positioned as is the ligament of Treitz. No small bowel wall thickening. No small bowel dilatation. The terminal ileum is normal. Tiny appendicolith noted towards the tip of the appendix. No periappendiceal edema or  inflammation to suggest acute appendicitis. No gross colonic mass. No colonic wall thickening. Diverticular changes are noted in the left colon without evidence of diverticulitis. Vascular/Lymphatic: There is moderate atherosclerotic calcification of the abdominal aorta without aneurysm. There is no gastrohepatic or hepatoduodenal ligament lymphadenopathy. No retroperitoneal or mesenteric lymphadenopathy. No pelvic sidewall lymphadenopathy. Reproductive: Multiple uterine fibroids evident.  No adnexal mass. Other: No intraperitoneal free fluid. Musculoskeletal: Small fluid collection again noted distal left iliopsoas bursa, stable. Bilateral hip replacement evident. No worrisome lytic or sclerotic osseous abnormality. Mild superior endplate compression deformity at L2 is stable in the interval IMPRESSION: 1. No acute findings in the abdomen or pelvis. 2. Interval progression of bibasilar collapse/consolidative opacity in the dependent lower lobes with moderate bilateral pleural effusions. 3. Subtle  nodularity of liver contour raises the question of cirrhosis. 4. Stable 2.9 cm lesion upper interpolar right kidney with attenuation too high to be a simple cyst. This is likely a cyst complicated by proteinaceous debris or hemorrhage with neoplasm considered less likely possibility but not entirely excluded. 5. Uterine fibroids. 6. Left colonic diverticulosis without diverticulitis. 7. Aortic Atherosclerosis (ICD10-I70.0). Electronically Signed   By: Misty Stanley M.D.   On: 06/16/2021 18:46  ? ?DG Chest Port 1 View ? ?Result Date: 06/16/2021 ?CLINICAL DATA:  Dyspnea EXAM: PORTABLE CHEST 1 VIEW COMPARISON:  Previous studies including the examination of 06/14/2021 FINDINGS: Apparent shift of mediastinum to the right may be due to rotation. Central pulmonary vessels are prominent. There is prominence of interstitial markings in the parahilar regions and lower lung fields. There is slight improvement in aeration of parahilar  regions. There is blunting of left lateral CP angle. There is no pneumothorax. IMPRESSION: There is prominence of interstitial markings in the parahilar regions and lower lung fields suggesting mild interstitial

## 2021-06-17 NOTE — Progress Notes (Signed)
Pharmacy Antibiotic Note ? ?[name] is a 76 y.o. female admitted on 06/10/2021 with bacteremia.  Pharmacy has been consulted for Merrem dosing. ?Blood cultures and urine cultures are discordant.  in February group C streptococcus bacteremia and sepsis and found to have iliopsoas muscle abscess that was drained. Now, patient has Urine cultures  showing over 100,000 colony-forming units of Enterococcus faecalis and 100,000 colony units of E. coli. Also, the blood cultures are growing Proteus from both sites. ID MD recommending Merrem ? ?Plan: ?Merrem 1gm IV q8h ?F/U cxs and clinical progress ?Monitor V/S, labs ? ?Height: '5\' 7"'$  (170.2 cm) ?Weight: 71.1 kg (156 lb 12 oz) ?IBW/kg (Calculated) : 61.6 ? ?Temp (24hrs), Avg:98.3 ?F (36.8 ?C), Min:96.9 ?F (36.1 ?C), Max:100.9 ?F (38.3 ?C) ? ?Recent Labs  ?Lab 06/12/21 ?6389 06/13/21 ?0415 06/14/21 ?0423 06/14/21 ?0749 06/14/21 ?1012 06/15/21 ?3734 06/16/21 ?0350 06/17/21 ?0353  ?WBC 9.7  --  9.5  --   --  9.9 8.0 9.5  ?CREATININE 0.72 0.71 0.94  --   --  0.79 0.72 0.69  ?LATICACIDVEN  --   --   --  2.6* 1.7 1.0  --   --   ?  ?Estimated Creatinine Clearance: 58.2 mL/min (by C-G formula based on SCr of 0.69 mg/dL).   ? ?Allergies  ?Allergen Reactions  ? Promethazine   ? ? ?Antimicrobials this admission: ?Merrem 3/16  >>  ?Ceftriaxone 3/13>>3/16 ?Duricef 3/9> 3/10 ? ?Microbiology results: ?3/13 BCX: BCID + Proteus S- cefepime,imipenem  R-ceftriaxone, cipro, septra, amp, cefazolin ?3/9 MRSA PCR is positive ?3/9 UCX: >100K CFU/ml E. Faecalis  ?>100K CFU/ml E.COLI ? ?Thank you for allowing pharmacy to be a part of this patient?s care. ? ?Cristy Friedlander ?06/17/2021 10:38 AM ? ?

## 2021-06-17 NOTE — Progress Notes (Signed)
Virtual Visit via Video Note ? ?I connected with [name] on  06/16/2021 at  by a video enabled telemedicine application using ICU video feed. ? ?Location: ?Patient:  [name] ICU bed 3 ?Provider: Home ?  ?I discussed the limitations of evaluation and management by telemedicine and the availability of in person appointments. The patient expressed understanding and agreed to proceed. ? ? ? ? ? ? ? ? ?Subjective: ? ?Patient was sleeping ? ?Antibiotics:  ?Anti-infectives (From admission, onward)  ? ? Start     Dose/Rate Route Frequency Ordered Stop  ? 06/17/21 1000  meropenem (MERREM) 1 g in sodium chloride 0.9 % 100 mL IVPB       ? 1 g ?200 mL/hr over 30 Minutes Intravenous Every 8 hours 06/17/21 0924    ? 06/14/21 1715  cefTRIAXone (ROCEPHIN) 2 g in sodium chloride 0.9 % 100 mL IVPB  Status:  Discontinued       ? 2 g ?200 mL/hr over 30 Minutes Intravenous Every 24 hours 06/14/21 1617 06/17/21 0924  ? 06/10/21 2200  cefadroxil (DURICEF) capsule 1,000 mg  Status:  Discontinued       ? 1,000 mg Oral 2 times daily 06/10/21 1012 06/11/21 0818  ? 06/10/21 1000  cefadroxil (DURICEF) capsule 1,000 mg  Status:  Discontinued       ? 1,000 mg Oral 2 times daily 06/10/21 0943 06/10/21 1012  ? ?  ? ? ?Medications: ?Scheduled Meds: ? (feeding supplement) PROSource Plus  30 mL Oral q AM  ? vitamin C  500 mg Oral Daily  ? atorvastatin  20 mg Oral Daily  ? buPROPion ER  100 mg Oral BID  ? Chlorhexidine Gluconate Cloth  6 each Topical Q0600  ? cycloSPORINE  1 drop Both Eyes BID  ? diltiazem  120 mg Oral Q6H  ? furosemide  40 mg Intravenous BID  ? gabapentin  300 mg Oral TID  ? icosapent Ethyl  1 g Oral q morning  ? insulin aspart  0-15 Units Subcutaneous TID WC  ? insulin aspart  0-5 Units Subcutaneous QHS  ? ketotifen  1 drop Both Eyes BID  ? loratadine  10 mg Oral Daily  ? magnesium oxide  400 mg Oral Daily  ? mouth rinse  15 mL Mouth Rinse BID  ? metoprolol tartrate  100 mg Oral BID  ? mometasone-formoterol  2 puff Inhalation  BID  ? multivitamin with minerals  1 tablet Oral Daily  ? multivitamin-lutein  1 capsule Oral Daily  ? mupirocin ointment  1 application. Nasal BID  ? pantoprazole  40 mg Oral Daily  ? polyvinyl alcohol  2 drop Both Eyes Daily  ? predniSONE  5 mg Oral Q breakfast  ? Rivaroxaban  20 mg Oral q1800  ? saccharomyces boulardii  250 mg Oral BID  ? senna  1 tablet Oral Daily  ? sodium chloride flush  3 mL Intravenous Q12H  ? zinc sulfate  220 mg Oral Daily  ? ?Continuous Infusions: ? sodium chloride    ? meropenem (MERREM) IV 1 g (06/17/21 1002)  ? ?PRN Meds:.sodium chloride, acetaminophen **OR** acetaminophen, albuterol, diclofenac Sodium, guaiFENesin-dextromethorphan, metoprolol tartrate, Muscle Rub, naLOXone (NARCAN)  injection, nitroGLYCERIN, ondansetron **OR** ondansetron (ZOFRAN) IV, oxyCODONE, polyethylene glycol, sodium chloride flush ? ? ? ?Objective: ?Weight change:  ? ?Intake/Output Summary (Last 24 hours) at 06/17/2021 1616 ?Last data filed at 06/17/2021 0900 ?Gross per 24 hour  ?Intake 345.97 ml  ?Output 725 ml  ?Net -379.03 ml  ? ? ?  Blood pressure (!) 125/50, pulse 97, temperature (!) 97.1 ?F (36.2 ?C), temperature source Oral, resp. rate (!) 24, height '5\' 7"'$  (1.702 m), weight 71.1 kg, SpO2 94 %. ?Temp:  [96.9 ?F (36.1 ?C)-98.8 ?F (37.1 ?C)] 97.1 ?F (36.2 ?C) (03/16 1601) ?Pulse Rate:  [66-127] 97 (03/16 1400) ?Resp:  [18-29] 24 (03/16 1400) ?BP: (97-157)/(50-119) 125/50 (03/16 1400) ?SpO2:  [93 %-98 %] 94 % (03/16 1400) ? ?Physical Exam: ?Physical Exam ?Constitutional:   ?   General: She is not in acute distress. ?HENT:  ?   Head: Normocephalic and atraumatic.  ?   Nose: Nose normal.  ?Eyes:  ?   General:     ?   Right eye: No discharge.     ?   Left eye: No discharge.  ?   Extraocular Movements: Extraocular movements intact.  ?Cardiovascular:  ?   Rate and Rhythm: Tachycardia present.  ?Pulmonary:  ?   Effort: No respiratory distress.  ?   Breath sounds: No wheezing.  ?Abdominal:  ?   General: There is no  distension.  ?Skin: ?   General: Skin is warm.  ?   Coloration: Skin is pale.  ?Psychiatric:     ?   Attention and Perception: She is inattentive.     ?   Speech: She is noncommunicative.     ?   Cognition and Memory: Cognition is impaired. Memory is impaired. She exhibits impaired recent memory and impaired remote memory.  ?  ? ?Patient was sleeping on her right side and I could not wake her up through video feed ? ? ?CBC: ? ? ? ?BMET ?Recent Labs  ?  06/16/21 ?0350 06/17/21 ?0353  ?NA 139 138  ?K 4.0 3.7  ?CL 106 104  ?CO2 25 26  ?GLUCOSE 105* 91  ?BUN 12 12  ?CREATININE 0.72 0.69  ?CALCIUM 8.2* 8.5*  ? ? ? ? ?Liver Panel ? ?No results for input(s): PROT, ALBUMIN, AST, ALT, ALKPHOS, BILITOT, BILIDIR, IBILI in the last 72 hours. ? ? ? ? ?Sedimentation Rate ?No results for input(s): ESRSEDRATE in the last 72 hours. ?C-Reactive Protein ?No results for input(s): CRP in the last 72 hours. ? ?Micro Results: ?Recent Results (from the past 720 hour(s))  ?Resp Panel by RT-PCR (Flu A&B, Covid) Nasopharyngeal Swab     Status: None  ? Collection Time: 05/20/21 10:10 PM  ? Specimen: Nasopharyngeal Swab; Nasopharyngeal(NP) swabs in vial transport medium  ?Result Value Ref Range Status  ? SARS Coronavirus 2 by RT PCR NEGATIVE NEGATIVE Final  ?  Comment: (NOTE) ?SARS-CoV-2 target nucleic acids are NOT DETECTED. ? ?The SARS-CoV-2 RNA is generally detectable in upper respiratory ?specimens during the acute phase of infection. The lowest ?concentration of SARS-CoV-2 viral copies this assay can detect is ?138 copies/mL. A negative result does not preclude SARS-Cov-2 ?infection and should not be used as the sole basis for treatment or ?other patient management decisions. A negative result may occur with  ?improper specimen collection/handling, submission of specimen other ?than nasopharyngeal swab, presence of viral mutation(s) within the ?areas targeted by this assay, and inadequate number of viral ?copies(<138 copies/mL). A negative  result must be combined with ?clinical observations, patient history, and epidemiological ?information. The expected result is Negative. ? ?Fact Sheet for Patients:  ?EntrepreneurPulse.com.au ? ?Fact Sheet for Healthcare Providers:  ?IncredibleEmployment.be ? ?This test is no t yet approved or cleared by the [place] FDA and  ?has been authorized for detection and/or diagnosis of SARS-CoV-2 by ?  FDA under an Emergency Use Authorization (EUA). This EUA will remain  ?in effect (meaning this test can be used) for the duration of the ?COVID-19 declaration under Section 564(b)(1) of the Act, 21 ?U.S.C.section 360bbb-3(b)(1), unless the authorization is terminated  ?or revoked sooner.  ? ? ?  ? Influenza A by PCR NEGATIVE NEGATIVE Final  ? Influenza B by PCR NEGATIVE NEGATIVE Final  ?  Comment: (NOTE) ?The Xpert Xpress SARS-CoV-2/FLU/RSV plus assay is intended as an aid ?in the diagnosis of influenza from Nasopharyngeal swab specimens and ?should not be used as a sole basis for treatment. Nasal washings and ?aspirates are unacceptable for Xpert Xpress SARS-CoV-2/FLU/RSV ?testing. ? ?Fact Sheet for Patients: ?EntrepreneurPulse.com.au ? ?Fact Sheet for Healthcare Providers: ?IncredibleEmployment.be ? ?This test is not yet approved or cleared by the [place] FDA and ?has been authorized for detection and/or diagnosis of SARS-CoV-2 by ?FDA under an Emergency Use Authorization (EUA). This EUA will remain ?in effect (meaning this test can be used) for the duration of the ?COVID-19 declaration under Section 564(b)(1) of the Act, 21 U.S.C. ?section 360bbb-3(b)(1), unless the authorization is terminated or ?revoked. ? ?Performed at [facility], [address] [place], [place] [address] ?  ?Blood Culture (routine x 2)     Status: Abnormal  ? Collection Time: 05/20/21 10:12 PM  ? Specimen: Right Antecubital; Blood  ?Result Value Ref Range Status  ?  Specimen Description   Final  ?  RIGHT ANTECUBITAL ?Performed at [facility], [address] [place], [place] [address] ?  ? Special Requests   Final  ?  BOTTLES DRAWN AEROBIC AND ANAEROBIC Blood Culture a

## 2021-06-17 NOTE — Progress Notes (Signed)
?PROGRESS NOTE ? ? ? [name]  YQI:[id number] DOB: Aug 25, 1945 DOA: 06/10/2021 ?PCP: Aletha Halim., PA-C ? ? ?Brief Narrative:  ?UrinePer HPI: ?[name] is a 76 y.o. female with medical history significant of paroxysmal atrial fibrillation on Xarelto for anticoagulation, chronic diastolic heart failure, dyslipidemia, hypertension, type 2 diabetes, prior CVA, peripheral vascular disease, rheumatoid arthritis, and chronic lower extremity wounds who presented to the ED after a fall at her SNF that was unwitnessed.  Upon arrival to the ED, she was found to be in atrial fibrillation with RVR and was also noted to be quite hypertensive.  She denies any chest pain, palpitations, or shortness of breath and complains of her left lower extremity erythema.  She appears to have been seen by wound care on 3/8 with debridements done at that time with no concern for cellulitis. ?  ?She was recently hospitalized for iliopsoas abscess that was drained and discharged on 2/21.  She has been completing her antibiotics at SNF. ?  ?In the ED, CT head was performed with no acute findings and she was noted to have atrial fibrillation with RVR with heart rate in the 150 bpm range.  She has been started on Cardizem drip for heart rate control. ? ?06/11/21: Patient has been admitted for heart rate control with noted atrial fibrillation with RVR.  She continues to remain on Cardizem drip.  Cardiology has assessed patient with recommendations to change Cardizem dosing today.  Electrolyte abnormalities to be corrected. ? ?06/12/21: Patient continues to have elevated heart rates requiring further adjustment of oral Cardizem dose.  Remains off Cardizem drip at this time.  We will also administer 1 dose of IV Lasix per cardiology recommendations on 3/10. ? ?06/13/21: Heart rates are better controlled with increased dose of Cardizem to 90 mg every 6 hours.  Plan to administer 3 more doses and then anticipate cardiology follow-up in a.m.  with likely consolidation of dose versus increase after further evaluation. ? ?06/14/21: Heart rates demonstrate worsening control and will require reinitiation of Cardizem drip.  Cardiology following with plans to load digoxin today.  Initially noted to have increased lactic acid levels which are now downtrending.  Evaluating for source of infection. ? ?06/15/21: Patient noted to have UTI with Proteus bacteremia and was started on Rocephin empirically 3/13.  She appears to be showing signs of improvement and will attempt to be weaned off Cardizem drip today per cardiology. ? ?06/16/21: Further cultures pending, appreciate ID consultation.  Patient being weaned off Cardizem drip with increase in oral Cardizem dosing.  Discontinue LR fluid.  Chest x-ray and BNP pending with possible need for further diuresis. ? ?06/17/21: Patient continues to be delirious and antibiotics have been switched to [facility] for ESBL coverage.  Cultures with Enterococcus faecalis and E. coli noted.  ID following.  CT abdomen pelvis performed with no acute findings.  Cardiology planning for further diuresis today and increase of oral diltiazem for better heart rate control and weaning of IV drip.  ? ? ?Assessment & Plan: ?  ?Principal Problem: ?  Atrial fibrillation with RVR ([place]) ?Active Problems: ?  Hypertension ?  Type 2 diabetes mellitus with hyperlipidemia ([place]) ?  Rheumatoid arthritis ([place]) ?  Peripheral vascular disease ([place]) ?  Chronic diastolic (congestive) heart failure (HCC) ?  Iliopsoas abscess on left [facility]) ?  Hypokalemia ?  Contusion of scalp ?  Fever ?  Delirium ?  Agitation ?  Bacteremia due to Proteus species ?  Gram-negative  bacteremia ?  Streptococcal bacteremia ?  Cellulitis of left lower extremity ?  Encephalopathy ? ?Assessment and Plan: ? ? ?Atrial fibrillation with RVR ([place]) ?Cardizem drip to be weaned off with p.o. Cardizem 120 mg every 6 hours reinitiated today ?TSH within normal limits ?Recent 2D echocardiogram noted on  2/23 and will avoid for now ?IV metoprolol as needed for elevated heart rates ?Likely will require increasing doses of Cardizem upon discharge ?Continue anticoagulation with Xarelto ?Appreciate cardiology recommendations-ongoing ?  ?Proteus bacteremia with UTI ?UTI with noted Enterococcus faecalis and E. coli on cultures ?Concern for ESBL for which patient has been switched to [place] ?Appeciate ongoing ID consultation ?Continue treatment with Rocephin ?Procalcitonin elevated ?Monitor CBC ?  ?Iliopsoas abscess on left [facility]) ?Patient finished up [place] on first day of admission ?Repeat CT performed by ID 3/15 with no acute findings ?  ?Chronic diastolic (congestive) heart failure (HCC) ?IV Lasix x2 doses per cardiology today ?2D echocardiogram recently performed ?  ?Peripheral vascular disease ([place]) ?Noted to have wounds on lower extremities which is being followed by outpatient wound care 3/8 ?  ?Rheumatoid arthritis ([place]) ?Continue prednisone ?  ?Type 2 diabetes mellitus with hyperlipidemia ([place]) ?SSI while inpatient ?Resume statin ?  ?Hypertension ?Noted to have elevated blood pressures on admission, monitor ?She had improved with increasing doses of Cardizem as well as dosing of Lasix ?  ?Lactic acidosis ?Currently improved ?Related to UTI above with Proteus bacteremia ? ?Delirium ?Secondary to above factors, continue to monitor ?  ?DVT prophylaxis: Xarelto ?Code Status: DNR ?Family Communication: Discussed with son on phone 3/14 ?Disposition Plan:  ?Status is: Inpatient ?Remains inpatient appropriate because: Need for IV Cardizem drip for heart rate control. ?  ?  ?Nutritional Assessment: ?  ?The patient?s BMI is: Body mass index is 26.17 kg/m?[name]. ?  ?Seen by dietician.  I agree with the assessment and plan as outlined below: ?  ?Nutrition Status: ?Skin Assessment: ?  ?I have examined the patient?s skin and I agree with the wound assessment as performed by the wound care RN as outlined below: ?  ?Pressure Injury  05/21/21 Buttocks Right Stage 2 -  Partial thickness loss of dermis presenting as a shallow open injury with a red, pink wound bed without slough. (Active)  ?05/21/21 1131  ?Location: Buttocks  ?Location Orientation: Right  ?Staging: Stage 2 -  Partial thickness loss of dermis presenting as a shallow open injury with a red, pink wound bed without slough.  ?Wound Description (Comments):   ?Present on Admission: Yes  ?   ?Pressure Injury 05/21/21 Buttocks Right Stage 2 -  Partial thickness loss of dermis presenting as a shallow open injury with a red, pink wound bed without slough. (Active)  ?05/21/21 1132  ?Location: Buttocks  ?Location Orientation: Right  ?Staging: Stage 2 -  Partial thickness loss of dermis presenting as a shallow open injury with a red, pink wound bed without slough.  ?Wound Description (Comments):   ?Present on Admission: Yes  ?   ?Pressure Injury 06/10/21 Heel Left Stage 2 -  Partial thickness loss of dermis presenting as a shallow open injury with a red, pink wound bed without slough. (Active)  ?06/10/21 1435  ?Location: Heel  ?Location Orientation: Left (Heel)  ?Staging: Stage 2 -  Partial thickness loss of dermis presenting as a shallow open injury with a red, pink wound bed without slough.  ?Wound Description (Comments):   ?Present on Admission: Yes  ?  ?  ?Consultants:  ?Cardiology ?ID ?  ?  Procedures:  ?See below ? ? Antimicrobials:  ?Anti-infectives (From admission, onward)  ? ? Start     Dose/Rate Route Frequency Ordered Stop  ? 06/17/21 1000  meropenem (MERREM) 1 g in sodium chloride 0.9 % 100 mL IVPB       ? 1 g ?200 mL/hr over 30 Minutes Intravenous Every 8 hours 06/17/21 0924    ? 06/14/21 1715  cefTRIAXone (ROCEPHIN) 2 g in sodium chloride 0.9 % 100 mL IVPB  Status:  Discontinued       ? 2 g ?200 mL/hr over 30 Minutes Intravenous Every 24 hours 06/14/21 1617 06/17/21 0924  ? 06/10/21 2200  cefadroxil (DURICEF) capsule 1,000 mg  Status:  Discontinued       ? 1,000 mg Oral 2 times  daily 06/10/21 1012 06/11/21 0818  ? 06/10/21 1000  cefadroxil (DURICEF) capsule 1,000 mg  Status:  Discontinued       ? 1,000 mg Oral 2 times daily 06/10/21 0943 06/10/21 1012  ? ?  ? ? ?Subjective: ?Pati

## 2021-06-17 NOTE — Progress Notes (Signed)
Patient continues to take off nasal cannula and remove ECG leads.   Attempted mittens but patient became increasingly agitated.  Patient oriented to self only and unable to redirect.  RN continues to replace oxygen and monitor.  ?

## 2021-06-17 NOTE — Progress Notes (Signed)
Report given. Patient transferred to RM# [id number] and situated in bed. Walker in place.  ?

## 2021-06-18 DIAGNOSIS — I4891 Unspecified atrial fibrillation: Secondary | ICD-10-CM | POA: Diagnosis not present

## 2021-06-18 DIAGNOSIS — I5032 Chronic diastolic (congestive) heart failure: Secondary | ICD-10-CM | POA: Diagnosis not present

## 2021-06-18 LAB — BASIC METABOLIC PANEL
Anion gap: 15 (ref 5–15)
BUN: 12 mg/dL (ref 8–23)
CO2: 25 mmol/L (ref 22–32)
Calcium: 8.3 mg/dL — ABNORMAL LOW (ref 8.9–10.3)
Chloride: 101 mmol/L (ref 98–111)
Creatinine, Ser: 0.9 mg/dL (ref 0.44–1.00)
GFR, Estimated: >60 mL/min (ref >60)
Glucose, Bld: 85 mg/dL (ref 70–99)
Potassium: 2.9 mmol/L — ABNORMAL LOW (ref 3.5–5.1)
Sodium: 141 mmol/L (ref 135–145)

## 2021-06-18 LAB — GLUCOSE, CAPILLARY
Glucose-Capillary: 90 mg/dL (ref 70–99)
Glucose-Capillary: 106 mg/dL — ABNORMAL HIGH (ref 70–99)
Glucose-Capillary: 128 mg/dL — ABNORMAL HIGH (ref 70–99)
Glucose-Capillary: 132 mg/dL — ABNORMAL HIGH (ref 70–99)
Glucose-Capillary: 98 mg/dL (ref 70–99)

## 2021-06-18 LAB — CBC
HCT: 36.2 % (ref 36.0–46.0)
Hemoglobin: 11 g/dL — ABNORMAL LOW (ref 12.0–15.0)
MCH: 25.6 pg — ABNORMAL LOW (ref 26.0–34.0)
MCHC: 30.4 g/dL (ref 30.0–36.0)
MCV: 84.4 fL (ref 80.0–100.0)
Platelets: 211 10*3/uL (ref 150–400)
RBC: 4.29 MIL/uL (ref 3.87–5.11)
RDW: 20 % — ABNORMAL HIGH (ref 11.5–15.5)
WBC: 13.1 10*3/uL — ABNORMAL HIGH (ref 4.0–10.5)
nRBC: 0 % (ref 0.0–0.2)

## 2021-06-18 LAB — TROPONIN I (HIGH SENSITIVITY): Troponin I (High Sensitivity): 13 ng/L (ref <18)

## 2021-06-18 LAB — LACTIC ACID, PLASMA: Lactic Acid, Venous: 1.1 mmol/L (ref 0.5–1.9)

## 2021-06-18 LAB — MAGNESIUM: Magnesium: 1.3 mg/dL — ABNORMAL LOW (ref 1.7–2.4)

## 2021-06-18 LAB — PROCALCITONIN: Procalcitonin: 17.68 ng/mL

## 2021-06-18 MED ORDER — METOPROLOL TARTRATE 5 MG/5ML IV SOLN: 5.0000 mg | Freq: Once | INTRAVENOUS | Status: DC

## 2021-06-18 MED ORDER — SODIUM CHLORIDE 0.9 % IV BOLUS
500.0000 mL | Freq: Once | INTRAVENOUS | Status: AC
Start: 2021-06-18 — End: 2021-06-18
  Administered 2021-06-18: 500 mL via INTRAVENOUS

## 2021-06-18 MED ORDER — SODIUM CHLORIDE 0.9 % IV SOLN: INTRAVENOUS | Status: DC | PRN

## 2021-06-18 MED ORDER — DILTIAZEM HCL 25 MG/5ML IV SOLN
INTRAVENOUS | Status: AC
  Filled 2021-06-18: qty 5

## 2021-06-18 MED ORDER — LORAZEPAM 2 MG/ML IJ SOLN
0.5000 mg | Freq: Once | INTRAMUSCULAR | Status: AC
  Administered 2021-06-18: 0.5 mg via INTRAVENOUS
  Filled 2021-06-18: qty 1

## 2021-06-18 MED ORDER — DILTIAZEM HCL 25 MG/5ML IV SOLN
15.0000 mg | Freq: Once | INTRAVENOUS | Status: AC
  Administered 2021-06-18: 15 mg via INTRAVENOUS

## 2021-06-18 MED ORDER — MAGNESIUM SULFATE 2 GM/50ML IV SOLN
2.0000 g | Freq: Once | INTRAVENOUS | Status: AC
  Administered 2021-06-18: 2 g via INTRAVENOUS
  Filled 2021-06-18: qty 50

## 2021-06-18 MED ORDER — POTASSIUM CHLORIDE 20 MEQ PO PACK
60.0000 meq | PACK | Freq: Once | ORAL | Status: AC
  Administered 2021-06-18: 60 meq via ORAL
  Filled 2021-06-18: qty 3

## 2021-06-18 MED ORDER — POTASSIUM CHLORIDE 10 MEQ/100ML IV SOLN
10.0000 meq | INTRAVENOUS | Status: AC
  Administered 2021-06-18 (×4): 10 meq via INTRAVENOUS
  Filled 2021-06-18 (×4): qty 100

## 2021-06-18 MED ORDER — HALOPERIDOL LACTATE 5 MG/ML IJ SOLN
1.0000 mg | Freq: Once | INTRAMUSCULAR | Status: AC
  Administered 2021-06-18: 1 mg via INTRAVENOUS

## 2021-06-18 MED ORDER — HALOPERIDOL LACTATE 5 MG/ML IJ SOLN
INTRAMUSCULAR | Status: AC
  Filled 2021-06-18: qty 1

## 2021-06-18 NOTE — Progress Notes (Signed)
? ?Progress Note ? ?Patient Name: [name] ?Date of Encounter: 06/18/2021 ? ?[facility] Cardiologist: Kirk Ruths, MD  ? ?Subjective  ? ?Agitated this AM ? ?Inpatient Medications  ?  ?Scheduled Meds: ? (feeding supplement) PROSource Plus  30 mL Oral q AM  ? vitamin C  500 mg Oral Daily  ? atorvastatin  20 mg Oral Daily  ? buPROPion ER  100 mg Oral BID  ? Chlorhexidine Gluconate Cloth  6 each Topical Q0600  ? cycloSPORINE  1 drop Both Eyes BID  ? diltiazem  120 mg Oral Q6H  ? feeding supplement  237 mL Oral BID BM  ? gabapentin  300 mg Oral TID  ? icosapent Ethyl  1 g Oral q morning  ? insulin aspart  0-15 Units Subcutaneous TID WC  ? insulin aspart  0-5 Units Subcutaneous QHS  ? ketotifen  1 drop Both Eyes BID  ? loratadine  10 mg Oral Daily  ? magnesium oxide  400 mg Oral Daily  ? mouth rinse  15 mL Mouth Rinse BID  ? metoprolol tartrate  100 mg Oral BID  ? mometasone-formoterol  2 puff Inhalation BID  ? multivitamin with minerals  1 tablet Oral Daily  ? multivitamin-lutein  1 capsule Oral Daily  ? mupirocin ointment  1 application. Nasal BID  ? pantoprazole  40 mg Oral Daily  ? polyvinyl alcohol  2 drop Both Eyes Daily  ? predniSONE  5 mg Oral Q breakfast  ? Rivaroxaban  20 mg Oral q1800  ? saccharomyces boulardii  250 mg Oral BID  ? senna  1 tablet Oral Daily  ? sodium chloride flush  3 mL Intravenous Q12H  ? zinc sulfate  220 mg Oral Daily  ? ?Continuous Infusions: ? sodium chloride    ? meropenem (MERREM) IV 1 g (06/18/21 0217)  ? ?PRN Meds: ?sodium chloride, acetaminophen **OR** acetaminophen, albuterol, diclofenac Sodium, guaiFENesin-dextromethorphan, metoprolol tartrate, Muscle Rub, naLOXone (NARCAN)  injection, nitroGLYCERIN, ondansetron **OR** ondansetron (ZOFRAN) IV, oxyCODONE, polyethylene glycol, sodium chloride flush  ? ?Vital Signs  ?  ?Vitals:  ? 06/18/21 0507 06/18/21 0540 06/18/21 0807 06/18/21 0854  ?BP: (!) 148/60 123/71 120/72 117/63  ?Pulse: (!) 108 (!) 121 92 86  ?Resp: (!) 28 (!)  24 20 (!) 22  ?Temp: (!) 101.2 ?F (38.4 ?C) 98.8 ?F (37.1 ?C) 98.4 ?F (36.9 ?C) 97.8 ?F (36.6 ?C)  ?TempSrc: Axillary Oral Oral   ?SpO2: 93% 95% 96% 99%  ?Weight:      ?Height:      ? ? ?Intake/Output Summary (Last 24 hours) at 06/18/2021 5409 ?Last data filed at 06/18/2021 0400 ?Gross per 24 hour  ?Intake 265.75 ml  ?Output 4050 ml  ?Net -3784.25 ml  ? ?Last 3 Weights 06/15/2021 06/11/2021 06/10/2021  ?Weight (lbs) 156 lb 12 oz 167 lb 1.7 oz 170 lb 3.1 oz  ?Weight (kg) 71.1 kg 75.8 kg 77.2 kg  ?   ? ?Telemetry  ?  ?Afib variable rates - Personally Reviewed ? ?ECG  ?  ?N/a - Personally Reviewed ? ?Physical Exam  ? ?GEN: No acute distress.   ?Neck: No JVD ?Cardiac: irreg, tachy ?Respiratory: Clear to auscultation bilaterally. ?GI: Soft, nontender, non-distended  ?MS: No edema; No deformity. ?Neuro:  Nonfocal  ?Psych: agitated ? ?Labs  ?  ?High Sensitivity Troponin:   ?Recent Labs  ?Lab 05/20/21 ?2212 05/21/21 ?0017 06/10/21 ?8119 06/10/21 ?0745 06/18/21 ?0615  ?TROPONINIHS 21* 19* '10 9 13  '$ ?   ?Chemistry ?Recent Labs  ?  Lab 06/16/21 ?0350 06/17/21 ?0353 06/18/21 ?0615  ?NA 139 138 141  ?K 4.0 3.7 2.9*  ?CL 106 104 101  ?CO2 '25 26 25  '$ ?GLUCOSE 105* 91 85  ?BUN '12 12 12  '$ ?CREATININE 0.72 0.69 0.90  ?CALCIUM 8.2* 8.5* 8.3*  ?MG 1.6* 1.7 1.3*  ?GFRNONAA >60 >60 >60  ?ANIONGAP '8 8 15  '$ ?  ?Lipids No results for input(s): CHOL, TRIG, HDL, LABVLDL, LDLCALC, CHOLHDL in the last 168 hours.  ?Hematology ?Recent Labs  ?Lab 06/16/21 ?0350 06/17/21 ?0353 06/18/21 ?0615  ?WBC 8.0 9.5 13.1*  ?RBC 3.50*  3.38* 3.66* 4.29  ?HGB 8.8* 9.6* 11.0*  ?HCT 30.9* 32.8* 36.2  ?MCV 88.3 89.6 84.4  ?MCH 25.1* 26.2 25.6*  ?MCHC 28.5* 29.3* 30.4  ?RDW 20.2* 20.3* 20.0*  ?PLT 228 238 211  ? ?Thyroid No results for input(s): TSH, FREET4 in the last 168 hours.  ?BNP ?Recent Labs  ?Lab 06/17/21 ?0353  ?BNP 549.0*  ?  ?DDimer No results for input(s): DDIMER in the last 168 hours.  ? ?Radiology  ?  ?CT ABDOMEN PELVIS W CONTRAST ? ?Result Date:  06/16/2021 ?CLINICAL DATA:  Fever and sepsis. EXAM: CT ABDOMEN AND PELVIS WITH CONTRAST TECHNIQUE: Multidetector CT imaging of the abdomen and pelvis was performed using the standard protocol following bolus administration of intravenous contrast. RADIATION DOSE REDUCTION: This exam was performed according to the departmental dose-optimization program which includes automated exposure control, adjustment of the mA and/or kV according to patient size and/or use of iterative reconstruction technique. CONTRAST:  151m OMNIPAQUE IOHEXOL 300 MG/ML  SOLN COMPARISON:  05/21/2021 FINDINGS: Lower chest: Interval progression of bibasilar collapse/consolidative opacity in the dependent lower lobes. Moderate bilateral pleural effusions evident. Hepatobiliary: Subtle nodularity of liver contour raises the question of cirrhosis. No suspicious focal abnormality within the liver parenchyma. There is no evidence for gallstones, gallbladder wall thickening, or pericholecystic fluid. No intrahepatic or extrahepatic biliary dilation. Pancreas: No focal mass lesion. No dilatation of the main duct. No intraparenchymal cyst. No peripancreatic edema. Spleen: No splenomegaly. No focal mass lesion. Adrenals/Urinary Tract: No adrenal nodule or mass. Left kidney unremarkable. Stable 2.9 cm lesion upper interpolar right kidney with attenuation too high to be a simple cyst. 13 mm low-density lesion posterior interpolar right kidney is likely a cyst. Focal cortical scarring noted lower pole right kidney. Additional tiny hypoattenuating lesions in both kidneys are too small to characterize. No evidence for hydroureter. Distal ureters and portions of the urinary bladder are obscured by beam hardening artifact from bilateral hip replacement. Stomach/Bowel: Stomach is nondistended. Duodenum is normally positioned as is the ligament of Treitz. No small bowel wall thickening. No small bowel dilatation. The terminal ileum is normal. Tiny appendicolith  noted towards the tip of the appendix. No periappendiceal edema or inflammation to suggest acute appendicitis. No gross colonic mass. No colonic wall thickening. Diverticular changes are noted in the left colon without evidence of diverticulitis. Vascular/Lymphatic: There is moderate atherosclerotic calcification of the abdominal aorta without aneurysm. There is no gastrohepatic or hepatoduodenal ligament lymphadenopathy. No retroperitoneal or mesenteric lymphadenopathy. No pelvic sidewall lymphadenopathy. Reproductive: Multiple uterine fibroids evident.  No adnexal mass. Other: No intraperitoneal free fluid. Musculoskeletal: Small fluid collection again noted distal left iliopsoas bursa, stable. Bilateral hip replacement evident. No worrisome lytic or sclerotic osseous abnormality. Mild superior endplate compression deformity at L2 is stable in the interval IMPRESSION: 1. No acute findings in the abdomen or pelvis. 2. Interval progression of bibasilar collapse/consolidative opacity in the dependent  lower lobes with moderate bilateral pleural effusions. 3. Subtle nodularity of liver contour raises the question of cirrhosis. 4. Stable 2.9 cm lesion upper interpolar right kidney with attenuation too high to be a simple cyst. This is likely a cyst complicated by proteinaceous debris or hemorrhage with neoplasm considered less likely possibility but not entirely excluded. 5. Uterine fibroids. 6. Left colonic diverticulosis without diverticulitis. 7. Aortic Atherosclerosis (ICD10-I70.0). Electronically Signed   By: Misty Stanley M.D.   On: 06/16/2021 18:46  ? ?DG Chest Port 1 View ? ?Result Date: 06/16/2021 ?CLINICAL DATA:  Dyspnea EXAM: PORTABLE CHEST 1 VIEW COMPARISON:  Previous studies including the examination of 06/14/2021 FINDINGS: Apparent shift of mediastinum to the right may be due to rotation. Central pulmonary vessels are prominent. There is prominence of interstitial markings in the parahilar regions and lower  lung fields. There is slight improvement in aeration of parahilar regions. There is blunting of left lateral CP angle. There is no pneumothorax. IMPRESSION: There is prominence of interstitial markings in the parahil

## 2021-06-18 NOTE — Progress Notes (Signed)
Initial Nutrition Assessment ? ?DOCUMENTATION CODES:  ? ?Severe malnutrition in context of acute illness/injury ? ?INTERVENTION:  ?ProSource Plus 30 ml TID (each 30 ml provides 100 kcal, 15 gr protein)  ? ?Ensure Enlive po BID, each supplement provides 350 kcal and 20 grams of protein.  ? ?Assist with feeding all meals  ? ?Liberalize diet to Regular given minimal intake  ? ?NUTRITION DIAGNOSIS:  ? ?Severe Malnutrition related to acute illness (Bacteremia-UTI) as evidenced by energy intake < or equal to 50% for > or equal to 5 days, moderate muscle depletion, severe muscle depletion, mild fat depletion, moderate fat depletion, percent weight loss. ? ? ?GOAL:  ?Patient will meet greater than or equal to 90% of their needs ? ?MONITOR:  ?PO intake, Supplement acceptance, Labs, Weight trends, Skin ? ?REASON FOR ASSESSMENT:  ? ?Malnutrition Screening Tool ?  ? ?ASSESSMENT: Patient presented on 3/9 from [facility] after a fall. Hx of HF, RA, DM2, CVA, HTN, PVD. Status post debridement of wound on 3/8- lower extremities.  Patient treated with Merrem for UTI (proteus bacteremia)-.  ? ?3/15- Wound nurse assessment- reviewed.  ? ?Patient hospitalized 2-16>2-20 sepsis due to left iliopsoas abscess, atrial fib with RVR and hypoxemic respiratory failure ? ?Nutrition intake highly variable between 0-75% of meals since admission. Patient breakfast tray is untouched. She has been agitated and unable to take nutrition orally at times. LOS day 8- patient triggered today for RD visit due to malnutrition screen. High risk for nutrient deficiency due to increased nutrient needs and minimal intake over the past 8 days.  ? ?If patient intake continue to be average < 50% of meals recommend consider placement of NGT and start enteral feeding. ? ?Bedside she has no family present. Green mitt on one of her hands and she has unraveled the ace wrap on her right lower leg.  ? ?Severe muscle loss lower extremities. Unsure if she ambulates in  wheelchair at facility.  ? ?Talked with NT who has brought patient coffee to drink and is going to encourage patient to eat some breakfast.  ? ?Medications: insulin, MVI, Zinc, senna, florastor  ? ?Significant weight loss of 12.5 kg/ 15% x 1 month.  ? ?Labs: ?BMP Latest Ref Rng & Units 06/18/2021 06/17/2021 06/16/2021  ?Glucose 70 - 99 mg/dL 85 91 105(H)  ?BUN 8 - 23 mg/dL '12 12 12  '$ ?Creatinine 0.44 - 1.00 mg/dL 0.90 0.69 0.72  ?BUN/Creat Ratio 12 - 28 - - -  ?Sodium 135 - 145 mmol/L 141 138 139  ?Potassium 3.5 - 5.1 mmol/L 2.9(L) 3.7 4.0  ?Chloride 98 - 111 mmol/L 101 104 106  ?CO2 22 - 32 mmol/L '25 26 25  '$ ?Calcium 8.9 - 10.3 mg/dL 8.3(L) 8.5(L) 8.2(L)  ?   ? ?NUTRITION - FOCUSED PHYSICAL EXAM: ? ?Flowsheet Row Most Recent Value  ?Orbital Region Moderate depletion  ?Upper Arm Region Mild depletion  ?Thoracic and Lumbar Region Mild depletion  ?Temple Region Moderate depletion  ?Clavicle Bone Region Moderate depletion  ?Clavicle and Acromion Bone Region Severe depletion  ?Scapular Bone Region Moderate depletion  ?Dorsal Hand Severe depletion  ?Patellar Region Severe depletion  ?Anterior Thigh Region Severe depletion  ?Posterior Calf Region Severe depletion  ?Edema (RD Assessment) Mild  ?Hair Reviewed  ?Eyes Unable to assess  ?Mouth Unable to assess  ?Skin Reviewed  ?Nails Reviewed  ? ?  ? ? ?Diet Order:   ?Diet Order   ? ?       ?  Diet heart  healthy/carb modified Room service appropriate? Yes; Fluid consistency: Thin  Diet effective now       ?  ? ?  ?  ? ?  ? ? ?EDUCATION NEEDS:  ?Not appropriate for education at this time ? ?Skin:  Skin Assessment: Skin Integrity Issues: (see nursing skin assessment) ? ?Last BM:  3/16 ? ?Height:  ? ?Ht Readings from Last 1 Encounters:  ?06/10/21 '5\' 7"'$  (1.702 m)  ? ? ?Weight:  ? ?Wt Readings from Last 1 Encounters:  ?06/15/21 71.1 kg  ? ? ?Ideal Body Weight:   61 kg ? ?BMI:  Body mass index is 24.55 kg/m?. ? ?Estimated Nutritional Needs:  ? ?Kcal:  1800-2000 ? ?Protein:  90-99  gr ? ?Fluid:  < 2 liters daily ? ? ?Colman Cater MS,RD,CSG,LDN ?Contact: AMION ?

## 2021-06-18 NOTE — Progress Notes (Signed)
?   06/18/21 0502  ?Vitals  ?Temp (!) 101.2 ?F (38.4 ?C)  ?Temp Source Axillary  ?BP (!) 96/55  ?BP Location Right Arm  ?BP Method Manual  ?Patient Position (if appropriate) Lying  ?Pulse Rate (!) 128  ?Resp (!) 26  ?MEWS COLOR  ?MEWS Score Color Red  ?Oxygen Therapy  ?SpO2 94 %  ?O2 Device Nasal Cannula  ?MEWS Score  ?MEWS Temp 1  ?MEWS Systolic 1  ?MEWS Pulse 2  ?MEWS RR 2  ?MEWS LOC 0  ?MEWS Score 6  ?Provider Notification  ?Provider Name/Title Dr.Zeirle-agosh  ?Date Provider Notified 06/18/21  ?Time Provider Notified [phone]  ?Notification Type  ?(Rapid)  ?Notification Reason  ?(Red mews of 7)  ?Provider response At bedside  ?Date of Provider Response 06/18/21  ?Time of Provider Response 0505  ?Rapid Response Notification  ?Name of Rapid Response RN Notified Donney Rankins RN  ?Date Rapid Response Notified 06/18/21  ?Time Rapid Response Notified 0502  ? ? ?

## 2021-06-18 NOTE — Progress Notes (Addendum)
? ? ? ? ?  INFECTIOUS DISEASE ATTENDING ADDENDUM: ? ? ?Date: 06/18/2021 ? ?Patient name: [name]  ?Medical record number: [id number]  ?Date of birth: [birth year]  ? ?Patient is transitioned out of the ICU ? ?Repeat blood cultures taken ? ?Plan at this point if she clears it to give her 7 days of carbapenem therapy. ? ?I will sign off for now ? ?Please call with further questions. ? ? ?Rhina Brackett [place] ?06/18/2021, 1:29 PM ? ? ?

## 2021-06-18 NOTE — Progress Notes (Signed)
?PROGRESS NOTE ? ? ? [name]  QPY:[id number] DOB: [birth year] DOA: 06/10/2021 ?PCP: Aletha Halim., PA-C ? ? ?Brief Narrative:  ?UrinePer HPI: ?[name] is a 76 y.o. female with medical history significant of paroxysmal atrial fibrillation on Xarelto for anticoagulation, chronic diastolic heart failure, dyslipidemia, hypertension, type 2 diabetes, prior CVA, peripheral vascular disease, rheumatoid arthritis, and chronic lower extremity wounds who presented to the ED after a fall at her SNF that was unwitnessed.  Upon arrival to the ED, she was found to be in atrial fibrillation with RVR and was also noted to be quite hypertensive.  She denies any chest pain, palpitations, or shortness of breath and complains of her left lower extremity erythema.  She appears to have been seen by wound care on 3/8 with debridements done at that time with no concern for cellulitis. ?  ?She was recently hospitalized for iliopsoas abscess that was drained and discharged on 2/21.  She has been completing her antibiotics at SNF. ?  ?In the ED, CT head was performed with no acute findings and she was noted to have atrial fibrillation with RVR with heart rate in the 150 bpm range.  She has been started on Cardizem drip for heart rate control. ? ?06/11/21: Patient has been admitted for heart rate control with noted atrial fibrillation with RVR.  She continues to remain on Cardizem drip.  Cardiology has assessed patient with recommendations to change Cardizem dosing today.  Electrolyte abnormalities to be corrected. ? ?06/12/21: Patient continues to have elevated heart rates requiring further adjustment of oral Cardizem dose.  Remains off Cardizem drip at this time.  We will also administer 1 dose of IV Lasix per cardiology recommendations on 3/10. ? ?06/13/21: Heart rates are better controlled with increased dose of Cardizem to 90 mg every 6 hours.  Plan to administer 3 more doses and then anticipate cardiology follow-up in a.m.  with likely consolidation of dose versus increase after further evaluation. ? ?06/14/21: Heart rates demonstrate worsening control and will require reinitiation of Cardizem drip.  Cardiology following with plans to load digoxin today.  Initially noted to have increased lactic acid levels which are now downtrending.  Evaluating for source of infection. ? ?06/15/21: Patient noted to have UTI with Proteus bacteremia and was started on Rocephin empirically 3/13.  She appears to be showing signs of improvement and will attempt to be weaned off Cardizem drip today per cardiology. ? ?06/16/21: Further cultures pending, appreciate ID consultation.  Patient being weaned off Cardizem drip with increase in oral Cardizem dosing.  Discontinue LR fluid.  Chest x-ray and BNP pending with possible need for further diuresis. ? ?06/17/21: Patient continues to be delirious and antibiotics have been switched to [facility] for ESBL coverage.  Cultures with Enterococcus faecalis and E. coli noted.  ID following.  CT abdomen pelvis performed with no acute findings.  Cardiology planning for further diuresis today and increase of oral diltiazem for better heart rate control and weaning of IV drip. ? ?06/18/21: Patient continues to have delirium with periods of agitation.  She is to remain on Merrem for total 7 days per ID recommendations.  Repeat blood cultures pending.  She continues to have some electrolyte abnormalities that are being repleted.  Heart rates appear to have better control with high-dose oral Cardizem and metoprolol as ordered.  She is now receiving a small dose of IV fluid given some hypotension.  ? ? ?Assessment & Plan: ?  ?Principal Problem: ?  Atrial fibrillation with RVR ([place]) ?Active Problems: ?  Hypertension ?  Type 2 diabetes mellitus with hyperlipidemia ([place]) ?  Rheumatoid arthritis ([place]) ?  Peripheral vascular disease ([place]) ?  Chronic diastolic (congestive) heart failure (HCC) ?  Iliopsoas abscess on left [facility]) ?   Hypokalemia ?  Contusion of scalp ?  Fever ?  Delirium ?  Agitation ?  Bacteremia due to Proteus species ?  Gram-negative bacteremia ?  Streptococcal bacteremia ?  Cellulitis of left lower extremity ?  Encephalopathy ? ?Assessment and Plan: ? ?Atrial fibrillation with RVR ([place]) ?Continue metoprolol and Cardizem 120 mg every 6 hours and monitor closely on telemetry ?TSH within normal limits ?Recent 2D echocardiogram noted on 2/23 and will avoid for now ?IV metoprolol as needed for elevated heart rates ?Continue anticoagulation with Xarelto ?Appreciate cardiology recommendations-ongoing ?  ?Proteus bacteremia with UTI ?UTI with noted Enterococcus faecalis and E. coli on cultures ?Concern for ESBL for which patient has been switched to [place] ?Appeciate ongoing ID consultation ?Continue treatment with Rocephin ?Procalcitonin elevated ?Monitor CBC ?Check lactic acid level and administer more fluid as needed and monitor blood pressure ? ?Hypokalemia/hypomagnesemia ?Replete and reevaluate in a.m. ?  ?Iliopsoas abscess on left [facility]) ?Patient finished up [place] on first day of admission ?Repeat CT performed by ID 3/15 with no acute findings ?  ?Chronic diastolic (congestive) heart failure (HCC) ?Perhaps may be over diuresed at this point, to be given small fluid bolus today ?2D echocardiogram recently performed ?  ?Peripheral vascular disease ([place]) ?Noted to have wounds on lower extremities which is being followed by outpatient wound care 3/8 ?  ?Rheumatoid arthritis ([place]) ?Continue prednisone ?  ?Type 2 diabetes mellitus with hyperlipidemia ([place]) ?SSI while inpatient ?Resume statin ?  ?Hypertension ?Noted to have elevated blood pressures on admission, monitor ?She had improved with increasing doses of Cardizem as well as dosing of Lasix ?  ?Lactic acidosis ?Currently improved ?Related to UTI above with Proteus bacteremia ?  ?Delirium ?Secondary to above factors, continue to monitor ?  ?DVT prophylaxis: Xarelto ?Code  Status: DNR ?Family Communication: Discussed with son on phone 3/17 ?Disposition Plan:  ?Status is: Inpatient ?Remains inpatient appropriate because: Need for IV Cardizem drip for heart rate control. ?  ?  ?Nutritional Assessment: ?  ?The patient?s BMI is: Body mass index is 26.17 kg/m?[name]. ?  ?Seen by dietician.  I agree with the assessment and plan as outlined below: ?  ?Nutrition Status: ?Skin Assessment: ?  ?I have examined the patient?s skin and I agree with the wound assessment as performed by the wound care RN as outlined below: ?  ?Pressure Injury 05/21/21 Buttocks Right Stage 2 -  Partial thickness loss of dermis presenting as a shallow open injury with a red, pink wound bed without slough. (Active)  ?05/21/21 1131  ?Location: Buttocks  ?Location Orientation: Right  ?Staging: Stage 2 -  Partial thickness loss of dermis presenting as a shallow open injury with a red, pink wound bed without slough.  ?Wound Description (Comments):   ?Present on Admission: Yes  ?   ?Pressure Injury 05/21/21 Buttocks Right Stage 2 -  Partial thickness loss of dermis presenting as a shallow open injury with a red, pink wound bed without slough. (Active)  ?05/21/21 1132  ?Location: Buttocks  ?Location Orientation: Right  ?Staging: Stage 2 -  Partial thickness loss of dermis presenting as a shallow open injury with a red, pink wound bed without slough.  ?Wound Description (Comments):   ?Present on  Admission: Yes  ?   ?Pressure Injury 06/10/21 Heel Left Stage 2 -  Partial thickness loss of dermis presenting as a shallow open injury with a red, pink wound bed without slough. (Active)  ?06/10/21 1435  ?Location: Heel  ?Location Orientation: Left (Heel)  ?Staging: Stage 2 -  Partial thickness loss of dermis presenting as a shallow open injury with a red, pink wound bed without slough.  ?Wound Description (Comments):   ?Present on Admission: Yes  ?  ?  ?Consultants:  ?Cardiology ?ID ?  ?Procedures:  ?See below ? ?Antimicrobials:   ?Anti-infectives (From admission, onward)  ? ? Start     Dose/Rate Route Frequency Ordered Stop  ? 06/17/21 1000  meropenem (MERREM) 1 g in sodium chloride 0.9 % 100 mL IVPB       ? 1 g ?200 mL/hr over 30 Minutes Intraveno

## 2021-06-18 NOTE — Progress Notes (Signed)
Patient's heart rate in 150s on the monitor at the desk. Metoprolol given with no effect. EKG  obtained which showed afib with RVR. Notified MD. New order for Cardizem '15mg'$  IV obtained. Cardizem effective.  ?

## 2021-06-19 DIAGNOSIS — E43 Unspecified severe protein-calorie malnutrition: Secondary | ICD-10-CM | POA: Insufficient documentation

## 2021-06-19 DIAGNOSIS — R7881 Bacteremia: Secondary | ICD-10-CM | POA: Diagnosis not present

## 2021-06-19 DIAGNOSIS — I4891 Unspecified atrial fibrillation: Secondary | ICD-10-CM | POA: Diagnosis not present

## 2021-06-19 DIAGNOSIS — B964 Proteus (mirabilis) (morganii) as the cause of diseases classified elsewhere: Secondary | ICD-10-CM | POA: Diagnosis not present

## 2021-06-19 DIAGNOSIS — I5032 Chronic diastolic (congestive) heart failure: Secondary | ICD-10-CM | POA: Diagnosis not present

## 2021-06-19 LAB — MAGNESIUM: Magnesium: 1.7 mg/dL (ref 1.7–2.4)

## 2021-06-19 LAB — GLUCOSE, CAPILLARY
Glucose-Capillary: 147 mg/dL — ABNORMAL HIGH (ref 70–99)
Glucose-Capillary: 233 mg/dL — ABNORMAL HIGH (ref 70–99)
Glucose-Capillary: 146 mg/dL — ABNORMAL HIGH (ref 70–99)
Glucose-Capillary: 118 mg/dL — ABNORMAL HIGH (ref 70–99)

## 2021-06-19 LAB — BASIC METABOLIC PANEL
Anion gap: 13 (ref 5–15)
BUN: 16 mg/dL (ref 8–23)
CO2: 23 mmol/L (ref 22–32)
Calcium: 8.4 mg/dL — ABNORMAL LOW (ref 8.9–10.3)
Chloride: 103 mmol/L (ref 98–111)
Creatinine, Ser: 0.76 mg/dL (ref 0.44–1.00)
GFR, Estimated: >60 mL/min (ref >60)
Glucose, Bld: 164 mg/dL — ABNORMAL HIGH (ref 70–99)
Potassium: 3.9 mmol/L (ref 3.5–5.1)
Sodium: 139 mmol/L (ref 135–145)

## 2021-06-19 LAB — CBC
HCT: 31.4 % — ABNORMAL LOW (ref 36.0–46.0)
Hemoglobin: 9.3 g/dL — ABNORMAL LOW (ref 12.0–15.0)
MCH: 25.2 pg — ABNORMAL LOW (ref 26.0–34.0)
MCHC: 29.6 g/dL — ABNORMAL LOW (ref 30.0–36.0)
MCV: 85.1 fL (ref 80.0–100.0)
Platelets: 174 10*3/uL (ref 150–400)
RBC: 3.69 MIL/uL — ABNORMAL LOW (ref 3.87–5.11)
RDW: 19.5 % — ABNORMAL HIGH (ref 11.5–15.5)
WBC: 11.9 10*3/uL — ABNORMAL HIGH (ref 4.0–10.5)
nRBC: 0 % (ref 0.0–0.2)

## 2021-06-19 MED ORDER — GLUCERNA SHAKE PO LIQD
237.0000 mL | Freq: Two times a day (BID) | ORAL | Status: DC
  Administered 2021-06-19: 237 mL via ORAL

## 2021-06-19 NOTE — Assessment & Plan Note (Signed)
This is a secondary to UTI.  Patient is treated with meropenem for total course of 7 days, last dose 3/23.  ?May consider discharge home on Monday, 3/20, patient can receive 3 more days of IV ertapenem using peripheral line at home. ?

## 2021-06-19 NOTE — Progress Notes (Signed)
?Progress Note ? ? ?Patient: [name] NOM:[id number] DOB: 1945/10/25 DOA: 06/10/2021     9 ?DOS: the patient was seen and examined on 06/19/2021 ?  ?Brief hospital course: ?UrinePer HPI: ?[name] is a 76 y.o. female with medical history significant of paroxysmal atrial fibrillation on Xarelto for anticoagulation, chronic diastolic heart failure, dyslipidemia, hypertension, type 2 diabetes, prior CVA, peripheral vascular disease, rheumatoid arthritis, and chronic lower extremity wounds who presented to the ED after a fall at her SNF that was unwitnessed.  Upon arrival to the ED, she was found to be in atrial fibrillation with RVR and was also noted to be quite hypertensive.  She denies any chest pain, palpitations, or shortness of breath and complains of her left lower extremity erythema.  She appears to have been seen by wound care on 3/8 with debridements done at that time with no concern for cellulitis. ?  ?She was recently hospitalized for iliopsoas abscess that was drained and discharged on 2/21.  She has been completing her antibiotics at SNF. ?  ?In the ED, CT head was performed with no acute findings and she was noted to have atrial fibrillation with RVR with heart rate in the 150 bpm range.  She has been started on Cardizem drip for heart rate control. ? ?06/11/21: Patient has been admitted for heart rate control with noted atrial fibrillation with RVR.  She continues to remain on Cardizem drip.  Cardiology has assessed patient with recommendations to change Cardizem dosing today.  Electrolyte abnormalities to be corrected. ? ?06/12/21: Patient continues to have elevated heart rates requiring further adjustment of oral Cardizem dose.  Remains off Cardizem drip at this time.  We will also administer 1 dose of IV Lasix per cardiology recommendations on 3/10. ? ?06/13/21: Heart rates are better controlled with increased dose of Cardizem to 90 mg every 6 hours.  Plan to administer 3 more doses and then  anticipate cardiology follow-up in a.m. with likely consolidation of dose versus increase after further evaluation. ? ?06/14/21: Heart rates demonstrate worsening control and will require reinitiation of Cardizem drip.  Cardiology following with plans to load digoxin today.  Initially noted to have increased lactic acid levels which are now downtrending.  Evaluating for source of infection. ? ?06/15/21: Patient noted to have UTI with Proteus bacteremia and was started on Rocephin empirically 3/13.  She appears to be showing signs of improvement and will attempt to be weaned off Cardizem drip today per cardiology. ? ?06/16/21: Further cultures pending, appreciate ID consultation.  Patient being weaned off Cardizem drip with increase in oral Cardizem dosing.  Discontinue LR fluid.  Chest x-ray and BNP pending with possible need for further diuresis. ? ?06/17/21: Patient continues to be delirious and antibiotics have been switched to [facility] for ESBL coverage.  Cultures with Enterococcus faecalis and E. coli noted.  ID following.  CT abdomen pelvis performed with no acute findings.  Cardiology planning for further diuresis today and increase of oral diltiazem for better heart rate control and weaning of IV drip. ? ?06/18/21: Patient continues to have delirium with periods of agitation.  She is to remain on Merrem for total 7 days per ID recommendations.  Repeat blood cultures pending.  She continues to have some electrolyte abnormalities that are being repleted.  Heart rates appear to have better control with high-dose oral Cardizem and metoprolol as ordered.  She is now receiving a small dose of IV fluid given some hypotension. ? ?Assessment and Plan: ?*  Atrial fibrillation with RVR ([place]) ?Heart rate controlled well.  Continue Xarelto. ? ?Protein-calorie malnutrition, severe ?Patient appeared severely malnourished with muscle atrophy.  Start Glucerna. ? ?Bacteremia due to Proteus species ?This is a secondary to UTI.   Patient is treated with meropenem for total course of 7 days, last dose 3/23.  ?May consider discharge home on Monday, 3/20, patient can receive 3 more days of IV ertapenem using peripheral line at home. ? ?Pressure injury of skin ?Pressure Injury 05/21/21 Buttocks Right Stage 2 -  Partial thickness loss of dermis presenting as a shallow open injury with a red, pink wound bed without slough. (Active)  ?05/21/21 1131  ?Location: Buttocks  ?Location Orientation: Right  ?Staging: Stage 2 -  Partial thickness loss of dermis presenting as a shallow open injury with a red, pink wound bed without slough.  ?Wound Description (Comments):   ?Present on Admission: Yes  ?   ?Pressure Injury 05/21/21 Buttocks Right Stage 2 -  Partial thickness loss of dermis presenting as a shallow open injury with a red, pink wound bed without slough. (Active)  ?05/21/21 1132  ?Location: Buttocks  ?Location Orientation: Right  ?Staging: Stage 2 -  Partial thickness loss of dermis presenting as a shallow open injury with a red, pink wound bed without slough.  ?Wound Description (Comments):   ?Present on Admission: Yes  ?   ?Pressure Injury 06/10/21 Heel Left Stage 2 -  Partial thickness loss of dermis presenting as a shallow open injury with a red, pink wound bed without slough. (Active)  ?06/10/21 1435  ?Location: Heel  ?Location Orientation: Left (Heel)  ?Staging: Stage 2 -  Partial thickness loss of dermis presenting as a shallow open injury with a red, pink wound bed without slough.  ?Wound Description (Comments):   ?Present on Admission: Yes  ? ? ? ? ? ? ?Hypokalemia ?Potassium normalized ? ?Hypomagnesemia ?Repleted, recheck level tomorrow. ? ?Iliopsoas abscess on left [facility]) ?Antibiotics is completed for this.  ? ?Chronic diastolic (congestive) heart failure (HCC) ?No exacerbation. ? ?Peripheral vascular disease ([place]) ?Follow-up with PCP as outpatient. ? ?Rheumatoid arthritis ([place]) ?Continue prednisone ? ?Type 2 diabetes mellitus with  hyperlipidemia ([place]) ?Continue sliding scale insulin. ? ?Hypertension ?Continue to follow. ? ?Streptococcal bacteremia-resolved as of 06/19/2021 ?This is a secondary to UTI.  Patient is treated with meropenem for total course of 7 days, last dose 3/23.  ?May consider discharge home on Monday, 3/20, patient can receive 3 more days of IV ertapenem using peripheral line at home. ? ? ? ? ?  ? ?Subjective:  ?Patient doing well today, currently no complaints. ? ?Physical Exam: ?Vitals:  ? 06/18/21 2103 06/18/21 2321 06/19/21 0503 06/19/21 0755  ?BP:  122/65 (!) 135/95   ?Pulse: 88 97 97   ?Resp:  20 18   ?Temp:  99.8 ?F (37.7 ?C) 98.1 ?F (36.7 ?C)   ?TempSrc:  Oral    ?SpO2: 98%  97% 100%  ?Weight:      ?Height:      ? ?General exam: Appears calm and comfortable  ?Respiratory system: Clear to auscultation. Respiratory effort normal. ?Cardiovascular system: S1 & S2 heard, RRR. No JVD, murmurs, rubs, gallops or clicks. No pedal edema. ?Gastrointestinal system: Abdomen is nondistended, soft and nontender. No organomegaly or masses felt. Normal bowel sounds heard. ?Central nervous system: Alert and oriented. No focal neurological deficits. ?Extremities: Symmetric 5 x 5 power. ?Skin: No rashes, lesions or ulcers ?Psychiatry: Judgement and insight appear normal. Mood & affect appropriate.  ? ?  Data Reviewed: ?Reviewed all lab results ?Family Communication:  ? ?Disposition: ?Status is: Inpatient ?Remains inpatient appropriate because: Severity of disease, IV treatment. ? Planned Discharge Destination: Home with Home Health ? ? ? ?Time spent: 24 minutes ? ?Author: ?Sharen Hones, MD ?06/19/2021 1:26 PM ? ?For on call review [email or web address].  ?

## 2021-06-19 NOTE — Assessment & Plan Note (Signed)
Pressure Injury 05/21/21 Buttocks Right Stage 2 -  Partial thickness loss of dermis presenting as a shallow open injury with a red, pink wound bed without slough. (Active)  ?05/21/21 1131  ?Location: Buttocks  ?Location Orientation: Right  ?Staging: Stage 2 -  Partial thickness loss of dermis presenting as a shallow open injury with a red, pink wound bed without slough.  ?Wound Description (Comments):   ?Present on Admission: Yes  ?   ?Pressure Injury 05/21/21 Buttocks Right Stage 2 -  Partial thickness loss of dermis presenting as a shallow open injury with a red, pink wound bed without slough. (Active)  ?05/21/21 1132  ?Location: Buttocks  ?Location Orientation: Right  ?Staging: Stage 2 -  Partial thickness loss of dermis presenting as a shallow open injury with a red, pink wound bed without slough.  ?Wound Description (Comments):   ?Present on Admission: Yes  ?   ?Pressure Injury 06/10/21 Heel Left Stage 2 -  Partial thickness loss of dermis presenting as a shallow open injury with a red, pink wound bed without slough. (Active)  ?06/10/21 1435  ?Location: Heel  ?Location Orientation: Left (Heel)  ?Staging: Stage 2 -  Partial thickness loss of dermis presenting as a shallow open injury with a red, pink wound bed without slough.  ?Wound Description (Comments):   ?Present on Admission: Yes  ? ? ? ? ? ?

## 2021-06-19 NOTE — Progress Notes (Signed)
Patient confused and yelling out "HELLO". When asking patient what was wrong she stated " I need to get dressed because I am going home."  ?

## 2021-06-19 NOTE — Assessment & Plan Note (Signed)
Patient appeared severely malnourished with muscle atrophy.  Start Glucerna. ?

## 2021-06-19 NOTE — Assessment & Plan Note (Signed)
Repleted, recheck level tomorrow. ?

## 2021-06-20 DIAGNOSIS — I4891 Unspecified atrial fibrillation: Secondary | ICD-10-CM | POA: Diagnosis not present

## 2021-06-20 LAB — GLUCOSE, CAPILLARY
Glucose-Capillary: 93 mg/dL (ref 70–99)
Glucose-Capillary: 116 mg/dL — ABNORMAL HIGH (ref 70–99)
Glucose-Capillary: 135 mg/dL — ABNORMAL HIGH (ref 70–99)
Glucose-Capillary: 130 mg/dL — ABNORMAL HIGH (ref 70–99)

## 2021-06-20 NOTE — Progress Notes (Signed)
?PROGRESS NOTE ? ? ? [name]  DSK:[id number] DOB: 1945-05-15 DOA: 06/10/2021 ?PCP: Aletha Halim., PA-C ? ? ?Brief Narrative:  ?UrinePer HPI: ?[name] is a 76 y.o. female with medical history significant of paroxysmal atrial fibrillation on Xarelto for anticoagulation, chronic diastolic heart failure, dyslipidemia, hypertension, type 2 diabetes, prior CVA, peripheral vascular disease, rheumatoid arthritis, and chronic lower extremity wounds who presented to the ED after a fall at her SNF that was unwitnessed.  Upon arrival to the ED, she was found to be in atrial fibrillation with RVR and was also noted to be quite hypertensive.  She denies any chest pain, palpitations, or shortness of breath and complains of her left lower extremity erythema.  She appears to have been seen by wound care on 3/8 with debridements done at that time with no concern for cellulitis. ?  ?She was recently hospitalized for iliopsoas abscess that was drained and discharged on 2/21.  She has been completing her antibiotics at SNF. ?  ?In the ED, CT head was performed with no acute findings and she was noted to have atrial fibrillation with RVR with heart rate in the 150 bpm range.  She has been started on Cardizem drip for heart rate control. ? ?06/11/21: Patient has been admitted for heart rate control with noted atrial fibrillation with RVR.  She continues to remain on Cardizem drip.  Cardiology has assessed patient with recommendations to change Cardizem dosing today.  Electrolyte abnormalities to be corrected. ? ?06/12/21: Patient continues to have elevated heart rates requiring further adjustment of oral Cardizem dose.  Remains off Cardizem drip at this time.  We will also administer 1 dose of IV Lasix per cardiology recommendations on 3/10. ? ?06/13/21: Heart rates are better controlled with increased dose of Cardizem to 90 mg every 6 hours.  Plan to administer 3 more doses and then anticipate cardiology follow-up in a.m.  with likely consolidation of dose versus increase after further evaluation. ? ?06/14/21: Heart rates demonstrate worsening control and will require reinitiation of Cardizem drip.  Cardiology following with plans to load digoxin today.  Initially noted to have increased lactic acid levels which are now downtrending.  Evaluating for source of infection. ? ?06/15/21: Patient noted to have UTI with Proteus bacteremia and was started on Rocephin empirically 3/13.  She appears to be showing signs of improvement and will attempt to be weaned off Cardizem drip today per cardiology. ? ?06/16/21: Further cultures pending, appreciate ID consultation.  Patient being weaned off Cardizem drip with increase in oral Cardizem dosing.  Discontinue LR fluid.  Chest x-ray and BNP pending with possible need for further diuresis. ? ?06/17/21: Patient continues to be delirious and antibiotics have been switched to [facility] for ESBL coverage.  Cultures with Enterococcus faecalis and E. coli noted.  ID following.  CT abdomen pelvis performed with no acute findings.  Cardiology planning for further diuresis today and increase of oral diltiazem for better heart rate control and weaning of IV drip. ? ?06/18/21: Patient continues to have delirium with periods of agitation.  She is to remain on Merrem for total 7 days per ID recommendations.  Repeat blood cultures pending.  She continues to have some electrolyte abnormalities that are being repleted.  Heart rates appear to have better control with high-dose oral Cardizem and metoprolol as ordered.  She is now receiving a small dose of IV fluid given some hypotension. ? ?06/20/21: Patient continues to have periods of confusion.  Could likely  plan for discharge back to SNF by 3/20 with 3 more days of IV ertapenem after PICC line placement.  Heart rates remain well controlled.  ? ? ?Assessment & Plan: ?  ?Principal Problem: ?  Atrial fibrillation with RVR ([place]) ?Active Problems: ?  Hypertension ?  Type 2  diabetes mellitus with hyperlipidemia ([place]) ?  Rheumatoid arthritis ([place]) ?  Peripheral vascular disease ([place]) ?  Chronic diastolic (congestive) heart failure (HCC) ?  Iliopsoas abscess on left [facility]) ?  Hypomagnesemia ?  Hypokalemia ?  Pressure injury of skin ?  Contusion of scalp ?  Fever ?  Delirium ?  Agitation ?  Bacteremia due to Proteus species ?  Encephalopathy ?  Protein-calorie malnutrition, severe ? ?Assessment and Plan: ? ? ?Atrial fibrillation with RVR ([place]) ?Heart rate controlled well.  Continue Xarelto. ?Continue diltiazem as ordered, appreciate cardiology follow-up ?  ?Protein-calorie malnutrition, severe ?Patient appeared severely malnourished with muscle atrophy.  Start Glucerna. ?  ?Bacteremia due to Proteus species ?This is a secondary to UTI.  Patient is treated with meropenem for total course of 7 days, last dose 3/23.  ?May consider discharge home on Monday, 3/20, patient can receive 3 more days of IV ertapenem using peripheral line at home. ?  ?Pressure injury of skin ?Pressure Injury 05/21/21 Buttocks Right Stage 2 -  Partial thickness loss of dermis presenting as a shallow open injury with a red, pink wound bed without slough. (Active)  ?05/21/21 1131  ?Location: Buttocks  ?Location Orientation: Right  ?Staging: Stage 2 -  Partial thickness loss of dermis presenting as a shallow open injury with a red, pink wound bed without slough.  ?Wound Description (Comments):   ?Present on Admission: Yes  ?   ?Pressure Injury 05/21/21 Buttocks Right Stage 2 -  Partial thickness loss of dermis presenting as a shallow open injury with a red, pink wound bed without slough. (Active)  ?05/21/21 1132  ?Location: Buttocks  ?Location Orientation: Right  ?Staging: Stage 2 -  Partial thickness loss of dermis presenting as a shallow open injury with a red, pink wound bed without slough.  ?Wound Description (Comments):   ?Present on Admission: Yes  ?   ?Pressure Injury 06/10/21 Heel Left Stage 2 -  Partial  thickness loss of dermis presenting as a shallow open injury with a red, pink wound bed without slough. (Active)  ?06/10/21 1435  ?Location: Heel  ?Location Orientation: Left (Heel)  ?Staging: Stage 2 -  Partial thickness loss of dermis presenting as a shallow open injury with a red, pink wound bed without slough.  ?Wound Description (Comments):   ?Present on Admission: Yes  ?  ?   ?Hypokalemia ?Potassium normalized ?  ?Hypomagnesemia ?Repleted, recheck level tomorrow. ?  ?Iliopsoas abscess on left [facility]) ?Antibiotics  completed for this.  ?  ?Chronic diastolic (congestive) heart failure (HCC) ?No exacerbation. ?  ?Peripheral vascular disease ([place]) ?Follow-up with PCP as outpatient. ?  ?Rheumatoid arthritis ([place]) ?Continue prednisone ?  ?Type 2 diabetes mellitus with hyperlipidemia ([place]) ?Continue sliding scale insulin. ?  ?Hypertension ?Continue to follow. ?  ?Streptococcal bacteremia-resolved as of 06/19/2021 ?This is a secondary to UTI.  Patient is treated with meropenem for total course of 7 days, last dose 3/23.  ?May consider discharge home on Monday, 3/20, patient can receive 3 more days of IV ertapenem using peripheral line at home. ?  ? ? ? ?DVT prophylaxis: Xarelto ?Code Status: Full ?Family Communication: None at bedside ?Disposition Plan:  ?Status is: Inpatient ?  Remains inpatient appropriate because: Need for IV medications. ? ? ?Nutritional Assessment: ? ?The patient?s BMI is: Body mass index is 24.55 kg/m?[name]. ? ?Seen by dietician.  I agree with the assessment and plan as outlined below: ? ?Nutrition Status: ?Nutrition Problem: Severe Malnutrition ?Etiology: acute illness (Bacteremia-UTI) ?Signs/Symptoms: energy intake < or equal to 50% for > or equal to 5 days, moderate muscle depletion, severe muscle depletion, mild fat depletion, moderate fat depletion, percent weight loss ?Percent weight loss: 12.5 % (12.5 kg x 1 month (15%)) ?Interventions: Ensure Enlive (each supplement provides 350kcal and 20 grams  of protein), Prostat, MVI, Liberalize Diet ? ?. ? ? ? ?Skin Assessment: ? ?I have examined the patient?s skin and I agree with the wound assessment as performed by the wound care RN as outlined below: ? ?Pressu

## 2021-06-20 NOTE — Progress Notes (Signed)
Pharmacy Antibiotic Note ? ?[name] is a 76 y.o. female admitted on 06/10/2021 with bacteremia.  Pharmacy has been consulted for Merrem dosing. ?Blood cultures and urine cultures are discordant.  in February group C streptococcus bacteremia and sepsis and found to have iliopsoas muscle abscess that was drained. Now, patient has Urine cultures  showing over 100,000 colony-forming units of Enterococcus faecalis and 100,000 colony units of E. coli. Also, the blood cultures are growing Proteus from both sites. ID MD recommending Merrem ? ?Plan: ?Merrem 1gm IV q8h x 7 days ?F/U cxs and clinical progress ?Monitor V/S, labs ? ?Height: '5\' 7"'$  (170.2 cm) ?Weight: 71.1 kg (156 lb 12 oz) ?IBW/kg (Calculated) : 61.6 ? ?Temp (24hrs), Avg:98.1 ?F (36.7 ?C), Min:97.8 ?F (36.6 ?C), Max:98.6 ?F (37 ?C) ? ?Recent Labs  ?Lab 06/14/21 ?0749 06/14/21 ?1012 06/15/21 ?1761 06/16/21 ?0350 06/17/21 ?0353 06/18/21 ?0615 06/18/21 ?1052 06/19/21 ?0606  ?WBC  --   --  9.9 8.0 9.5 13.1*  --  11.9*  ?CREATININE  --   --  0.79 0.72 0.69 0.90  --  0.76  ?LATICACIDVEN 2.6* 1.7 1.0  --   --   --  1.1  --   ? ?  ?Estimated Creatinine Clearance: 58.2 mL/min (by C-G formula based on SCr of 0.76 mg/dL).   ? ?Allergies  ?Allergen Reactions  ? Promethazine   ? ? ?Antimicrobials this admission: ?Merrem 3/16  >>  ?Ceftriaxone 3/13>>3/16 ?Duricef 3/9> 3/10 ? ?Microbiology results: ?3/17 Bcx: ngtd ?3/13 BCX: BCID + Proteus S- cefepime,imipenem  R-ceftriaxone, cipro, septra, amp, cefazolin ?3/9 MRSA PCR is positive ?3/9 UCX: >100K CFU/ml E. Faecalis /e. coli ?>100K CFU/ml E.COLI ? ?Thank you for allowing pharmacy to be a part of this patient?s care. ? ?Margot Ables, PharmD ?Clinical Pharmacist ?06/20/2021 9:52 AM ? ? ?

## 2021-06-21 ENCOUNTER — Inpatient Hospital Stay (HOSPITAL_COMMUNITY): Payer: 59

## 2021-06-21 ENCOUNTER — Inpatient Hospital Stay: Payer: Self-pay

## 2021-06-21 DIAGNOSIS — R7881 Bacteremia: Secondary | ICD-10-CM | POA: Diagnosis not present

## 2021-06-21 DIAGNOSIS — E43 Unspecified severe protein-calorie malnutrition: Secondary | ICD-10-CM

## 2021-06-21 DIAGNOSIS — G9341 Metabolic encephalopathy: Secondary | ICD-10-CM

## 2021-06-21 DIAGNOSIS — I4891 Unspecified atrial fibrillation: Secondary | ICD-10-CM | POA: Diagnosis not present

## 2021-06-21 DIAGNOSIS — I5032 Chronic diastolic (congestive) heart failure: Secondary | ICD-10-CM | POA: Diagnosis not present

## 2021-06-21 DIAGNOSIS — L03116 Cellulitis of left lower limb: Secondary | ICD-10-CM | POA: Diagnosis not present

## 2021-06-21 LAB — GLUCOSE, CAPILLARY
Glucose-Capillary: 105 mg/dL — ABNORMAL HIGH (ref 70–99)
Glucose-Capillary: 106 mg/dL — ABNORMAL HIGH (ref 70–99)
Glucose-Capillary: 113 mg/dL — ABNORMAL HIGH (ref 70–99)

## 2021-06-21 LAB — SUSCEPTIBILITY RESULT

## 2021-06-21 LAB — CBC
HCT: 31.9 % — ABNORMAL LOW (ref 36.0–46.0)
Hemoglobin: 9.5 g/dL — ABNORMAL LOW (ref 12.0–15.0)
MCH: 26 pg (ref 26.0–34.0)
MCHC: 29.8 g/dL — ABNORMAL LOW (ref 30.0–36.0)
MCV: 87.4 fL (ref 80.0–100.0)
Platelets: 217 10*3/uL (ref 150–400)
RBC: 3.65 MIL/uL — ABNORMAL LOW (ref 3.87–5.11)
RDW: 19.9 % — ABNORMAL HIGH (ref 11.5–15.5)
WBC: 10.8 10*3/uL — ABNORMAL HIGH (ref 4.0–10.5)
nRBC: 0 % (ref 0.0–0.2)

## 2021-06-21 LAB — BASIC METABOLIC PANEL
Anion gap: 10 (ref 5–15)
BUN: 16 mg/dL (ref 8–23)
CO2: 22 mmol/L (ref 22–32)
Calcium: 8.3 mg/dL — ABNORMAL LOW (ref 8.9–10.3)
Chloride: 107 mmol/L (ref 98–111)
Creatinine, Ser: 0.66 mg/dL (ref 0.44–1.00)
GFR, Estimated: >60 mL/min (ref >60)
Glucose, Bld: 117 mg/dL — ABNORMAL HIGH (ref 70–99)
Potassium: 3.7 mmol/L (ref 3.5–5.1)
Sodium: 139 mmol/L (ref 135–145)

## 2021-06-21 LAB — MAGNESIUM: Magnesium: 1.7 mg/dL (ref 1.7–2.4)

## 2021-06-21 LAB — SUSCEPTIBILITY, AER + ANAEROB: Source of Sample: 8680

## 2021-06-21 MED ORDER — LORAZEPAM 2 MG/ML IJ SOLN
0.5000 mg | Freq: Once | INTRAMUSCULAR | Status: AC
  Administered 2021-06-21: 0.5 mg via INTRAVENOUS
  Filled 2021-06-21: qty 1

## 2021-06-21 MED ORDER — ERTAPENEM IV (FOR PTA / DISCHARGE USE ONLY): 1.0000 g | INTRAVENOUS | 0 refills | Status: AC

## 2021-06-21 MED ORDER — SODIUM CHLORIDE 0.9% FLUSH
10.0000 mL | Freq: Two times a day (BID) | INTRAVENOUS | Status: DC
  Administered 2021-06-21: 10 mL

## 2021-06-21 MED ORDER — SODIUM CHLORIDE 0.9 % IV SOLN: 1.0000 g | INTRAVENOUS | 0 refills | Status: DC

## 2021-06-21 MED ORDER — ENSURE ENLIVE PO LIQD: 237.0000 mL | Freq: Two times a day (BID) | ORAL | Status: DC

## 2021-06-21 MED ORDER — RIVAROXABAN 20 MG PO TABS: 20.0000 mg | ORAL_TABLET | Freq: Every day | ORAL | 2 refills | Status: DC

## 2021-06-21 MED ORDER — ERTAPENEM SODIUM 1 G IJ SOLR: 1.0000 g | INTRAMUSCULAR | Status: DC

## 2021-06-21 MED ORDER — CHLORHEXIDINE GLUCONATE CLOTH 2 % EX PADS: 6.0000 | MEDICATED_PAD | Freq: Every day | CUTANEOUS | Status: DC

## 2021-06-21 MED ORDER — SODIUM CHLORIDE 0.9% FLUSH: 10.0000 mL | INTRAVENOUS | Status: DC | PRN

## 2021-06-21 MED ORDER — OXYCODONE HCL 5 MG PO TABS: 7.5000 mg | ORAL_TABLET | Freq: Three times a day (TID) | ORAL | 0 refills | Status: DC | PRN

## 2021-06-21 MED ORDER — DILTIAZEM HCL ER COATED BEADS 360 MG PO CP24: 360.0000 mg | ORAL_CAPSULE | Freq: Every day | ORAL | 11 refills | Status: DC

## 2021-06-21 NOTE — [place] FL2 (Signed)
?[place] MEDICAID FL2 LEVEL OF CARE SCREENING TOOL  ?  ? ?IDENTIFICATION  ?Patient Name: ?[name] Birthdate: 11-21-1945 Sex: female Admission Date (Current Location): ?06/10/2021  ?[place] and [place] Number: ? [place] and Address:  ?[place] [address] [place] ?     Provider Number: ?[id number]  ?Attending Physician Name and Address:  ?Barton Dubois, MD ? Relative Name and Phone Number:  ?  ?   ?Current Level of Care: ?Hospital Recommended Level of Care: ?[place] Prior Approval Number: ?  ? ?Date Approved/Denied: ?  PASRR Number: ?  ? ?Discharge Plan: ?SNF ?  ? ?Current Diagnoses: ?Patient Active Problem List  ? Diagnosis Date Noted  ? Protein-calorie malnutrition, severe 06/19/2021  ? Encephalopathy   ? Contusion of scalp   ? Fever   ? Delirium   ? Agitation   ? Bacteremia due to Proteus species   ? Cellulitis of left lower extremity   ? Hypokalemia 05/21/2021  ? Acute metabolic encephalopathy 14/78/2956  ? Pressure injury of skin 05/21/2021  ? Sepsis due to undetermined organism ([place]) 03/30/2020  ? Hypomagnesemia 03/30/2020  ? Hypophosphatemia 03/30/2020  ? Hyperglycemia   ? Iliopsoas abscess on left ([place]) 10/04/2017  ? HCAP (healthcare-associated pneumonia) 10/04/2017  ? Acute lower UTI 10/04/2017  ? Hallucinations 07/25/2017  ? Abnormal LFTs 07/24/2017  ? Acute on chronic diastolic CHF (congestive heart failure) ([place])   ? Elevated LFTs   ? AKI (acute kidney injury) ([place])   ? Elevated troponin   ? Stroke, acute, embolic ([place]) 21/30/8657  ? Mitral regurgitation 06/17/2017  ? Acute CVA (cerebrovascular accident) ([place])   ? Thyroid nodule 06/15/2017  ? Atrial fibrillation with RVR ([place]) 06/14/2017  ? CVA (cerebral vascular accident) ([place]) 06/14/2017  ? Type II diabetes mellitus ([place])   ? Peripheral vascular disease ([place])   ? Hyperlipidemia   ? Hiatal hernia   ? Edema   ? Chronic diastolic (congestive) heart failure (HCC)   ? Carpal tunnel  syndrome   ? Chronic atrial fibrillation with RVR (HCC)   ? Anemia   ? Actinic keratosis   ? Intractable nausea and vomiting 05/31/2016  ? Nausea & vomiting 05/30/2016  ? Abnormal chest x-ray 05/30/2016  ? Influenza A 05/21/2016  ? Acute CHF (congestive heart failure) ([place]) 05/20/2016  ? History of GI bleed 03/29/2016  ? Chronic anticoagulation 03/29/2016  ? Symptomatic anemia 03/19/2016  ? Spinal stenosis of lumbar region 02/10/2016  ? Varicose veins of left leg with edema 09/15/2015  ? Preop cardiovascular exam 08/25/2015  ? Iron deficiency anemia due to chronic blood loss 07/17/2015  ? Anemia of chronic renal failure, stage 3 (moderate) ([place]) 07/17/2015  ? Anticoagulant causing adverse effect in therapeutic use 07/17/2015  ? Symptomatic bradycardia 02/21/2015  ? Bradycardia   ? Acute deep vein thrombosis (DVT) of left lower extremity ([place]) 05/06/2014  ? Abdominal wall mass of right lower quadrant 07/08/2013  ? OA (osteoarthritis) of hip 04/03/2013  ? Osteoarthritis of hip 01/18/2013  ? Night sweats 09/06/2012  ? Chest pain   ? Anxiety and depression   ? Gastroesophageal reflux disease   ? Hepatitis   ? Jaundice   ? Hypertension 07/01/2010  ? Hypercholesterolemia 07/01/2010  ? Type 2 diabetes mellitus with hyperlipidemia ([place]) 07/01/2010  ? Rheumatoid arthritis ([place]) 07/01/2010  ? Sinus tachycardia 07/01/2010  ? Chest pain, atypical 07/01/2010  ? ? ?Orientation RESPIRATION BLADDER Height & Weight   ?  ?  Self, Situation, Place ? Normal Incontinent Weight: 156 lb 12 oz (71.1 kg) ?Height:  '5\' 7"'$  (170.2 cm)  ?BEHAVIORAL SYMPTOMS/MOOD NEUROLOGICAL BOWEL NUTRITION STATUS  ?    Incontinent Diet (Heart healthy)  ?AMBULATORY STATUS COMMUNICATION OF NEEDS Skin   ?Extensive Assist (wc) Verbally PU Stage and Appropriate Care (stage 2: left, heel, right buttock. Venous stasis ucer pre tipital left, right; diabetic foot ulcer left medial, left toe, left toe lateral.) ?  ?  ?  ?    ?     ?     ? ? ?Personal Care Assistance Level  of Assistance  ?Bathing, Feeding, Dressing Bathing Assistance: Limited assistance ?Feeding assistance: Independent ?Dressing Assistance: Limited assistance ?   ? ?Functional Limitations Info  ?Sight, Hearing, Speech Sight Info: Adequate ?Hearing Info: Adequate ?Speech Info: Adequate  ? ? ?SPECIAL CARE FACTORS FREQUENCY  ?    ?  ?  ?  ?  ?  ?  ?   ? ? ?Contractures Contractures Info: Not present  ? ? ?Additional Factors Info  ?Code Status, Allergies Code Status Info: DNR ?Allergies Info: Promethazine ?  ?  ?  ?   ? ?Current Medications (06/21/2021):  This is the current hospital active medication list ?Current Facility-Administered Medications  ?Medication Dose Route Frequency Provider Last Rate Last Admin  ? (feeding supplement) PROSource Plus liquid 30 mL  30 mL Oral q AM Manuella Ghazi, Pratik D, DO   30 mL at 06/19/21 2355  ? 0.9 %  sodium chloride infusion  250 mL Intravenous PRN Manuella Ghazi, Pratik D, DO   Stopped at 06/18/21 1458  ? 0.9 %  sodium chloride infusion   Intravenous PRN Heath Lark D, DO 50 mL/hr at 06/18/21 1503 Infusion Verify at 06/18/21 1503  ? acetaminophen (TYLENOL) tablet 650 mg  650 mg Oral Q6H PRN Heath Lark D, DO   650 mg at 06/19/21 7322  ? Or  ? acetaminophen (TYLENOL) suppository 650 mg  650 mg Rectal Q6H PRN Heath Lark D, DO   650 mg at 06/18/21 0505  ? albuterol (PROVENTIL) (2.5 MG/3ML) 0.083% nebulizer solution 2.5 mg  2.5 mg Nebulization Q4H PRN Manuella Ghazi, Pratik D, DO      ? ascorbic acid (VITAMIN C) tablet 500 mg  500 mg Oral Daily Manuella Ghazi, Pratik D, DO   500 mg at 06/21/21 0254  ? atorvastatin (LIPITOR) tablet 20 mg  20 mg Oral Daily Manuella Ghazi, Pratik D, DO   20 mg at 06/21/21 2706  ? buPROPion ER [facility] SR) 12 hr tablet 100 mg  100 mg Oral BID Manuella Ghazi, Pratik D, DO   100 mg at 06/21/21 2376  ? Chlorhexidine Gluconate Cloth 2 % PADS 6 each  6 each Topical Daily Barton Dubois, MD      ? cycloSPORINE (RESTASIS) 0.05 % ophthalmic emulsion 1 drop  1 drop Both Eyes BID Manuella Ghazi, Pratik D, DO   1 drop at  06/21/21 0805  ? diclofenac Sodium (VOLTAREN) 1 % topical gel 2 g  2 g Topical QHS PRN Manuella Ghazi, Pratik D, DO   2 g at 06/14/21 2036  ? diltiazem (CARDIZEM) tablet 120 mg  120 mg Oral Q6H Arnoldo Lenis, MD   120 mg at 06/21/21 1157  ? feeding supplement (ENSURE ENLIVE / ENSURE PLUS) liquid 237 mL  237 mL Oral BID BM Shah, Pratik D, DO   237 mL at 06/20/21 1644  ? gabapentin (NEURONTIN) capsule 300 mg  300 mg Oral TID Heath Lark D, DO  300 mg at 06/21/21 6712  ? guaiFENesin-dextromethorphan (ROBITUSSIN DM) 100-10 MG/5ML syrup 5 mL  5 mL Oral Q4H PRN Manuella Ghazi, Pratik D, DO   5 mL at 06/13/21 1354  ? icosapent Ethyl (VASCEPA) 1 g capsule 1 g  1 g Oral q morning Manuella Ghazi, Pratik D, DO   1 g at 06/20/21 1745  ? insulin aspart (novoLOG) injection 0-15 Units  0-15 Units Subcutaneous TID WC Manuella Ghazi, Pratik D, DO   2 Units at 06/20/21 1743  ? insulin aspart (novoLOG) injection 0-5 Units  0-5 Units Subcutaneous QHS Manuella Ghazi, Pratik D, DO      ? ketotifen (ZADITOR) 0.025 % ophthalmic solution 1 drop  1 drop Both Eyes BID Manuella Ghazi, Pratik D, DO   1 drop at 06/21/21 4580  ? loratadine (CLARITIN) tablet 10 mg  10 mg Oral Daily Manuella Ghazi, Pratik D, DO   10 mg at 06/21/21 9983  ? magnesium oxide (MAG-OX) tablet 400 mg  400 mg Oral Daily Heath Lark D, DO   400 mg at 06/21/21 3825  ? MEDLINE mouth rinse  15 mL Mouth Rinse BID Heath Lark D, DO   15 mL at 06/21/21 0539  ? meropenem (MERREM) 1 g in sodium chloride 0.9 % 100 mL IVPB  1 g Intravenous Q8H Tommy Medal, Lavell Islam, MD 200 mL/hr at 06/21/21 1157 1 g at 06/21/21 1157  ? metoprolol tartrate (LOPRESSOR) injection 5 mg  5 mg Intravenous Q6H PRN Manuella Ghazi, Pratik D, DO   5 mg at 06/18/21 0249  ? metoprolol tartrate (LOPRESSOR) tablet 100 mg  100 mg Oral BID Heath Lark D, DO   100 mg at 06/21/21 7673  ? mometasone-formoterol (DULERA) 200-5 MCG/ACT inhaler 2 puff  2 puff Inhalation BID Heath Lark D, DO   2 puff at 06/21/21 4193  ? multivitamin with minerals tablet 1 tablet  1 tablet Oral Daily Heath Lark D, DO   1 tablet at 06/21/21 7902  ? multivitamin-lutein (OCUVITE-LUTEIN) capsule 1 capsule  1 capsule Oral Daily Manuella Ghazi, Pratik D, DO   1 capsule at 06/21/21 4097  ? Muscle Rub CREA   Topical PRN Wallene Dales

## 2021-06-21 NOTE — Progress Notes (Signed)
IV team at bedside placing PICC line  

## 2021-06-21 NOTE — Plan of Care (Signed)
Discharged to [place]. ?

## 2021-06-21 NOTE — Discharge Summary (Signed)
?Physician Discharge Summary ?  ?Patient: BAYLER NEHRING MRN: [id number] DOB: [birth year]  ?Admit date:     06/10/2021  ?Discharge date: 06/21/21  ?Discharge Physician: Barton Dubois  ? ?PCP: Aletha Halim., PA-C  ? ?Recommendations at discharge:  ?Repeat basic metabolic panel to follow ultralights and renal function ?Remove PICC line after completing 4 more days of IV antibiotics as instructed. ?Arrange follow-up with PCP in 10 days ?Continue outpatient follow-up with cardiology service as instructed ? ? ?Discharge Diagnoses: ?Principal Problem: ?  Atrial fibrillation with RVR ([place]) ?Active Problems: ?  Hypertension ?  Type 2 diabetes mellitus with hyperlipidemia ([place]) ?  Rheumatoid arthritis ([place]) ?  Peripheral vascular disease ([place]) ?  Chronic diastolic (congestive) heart failure (HCC) ?  Iliopsoas abscess on left [facility]) ?  Hypomagnesemia ?  Hypokalemia ?  Pressure injury of skin ?  Contusion of scalp ?  Fever ?  Delirium ?  Agitation ?  Bacteremia due to Proteus species ?  Encephalopathy ?  Protein-calorie malnutrition, severe ? ? ?Hospital Course: ?UrinePer HPI: ?ANNLOUISE GERETY is a 76 y.o. female with medical history significant of paroxysmal atrial fibrillation on Xarelto for anticoagulation, chronic diastolic heart failure, dyslipidemia, hypertension, type 2 diabetes, prior CVA, peripheral vascular disease, rheumatoid arthritis, and chronic lower extremity wounds who presented to the ED after a fall at her SNF that was unwitnessed.  Upon arrival to the ED, she was found to be in atrial fibrillation with RVR and was also noted to be quite hypertensive.  She denies any chest pain, palpitations, or shortness of breath and complains of her left lower extremity erythema.  She appears to have been seen by wound care on 3/8 with debridements done at that time with no concern for cellulitis. ?  ?She was recently hospitalized for iliopsoas abscess that was drained and discharged on 2/21.  She has been completing her  antibiotics at SNF. ?  ?In the ED, CT head was performed with no acute findings and she was noted to have atrial fibrillation with RVR with heart rate in the 150 bpm range.  She has been started on Cardizem drip for heart rate control. ? ?06/11/21: Patient has been admitted for heart rate control with noted atrial fibrillation with RVR.  She continues to remain on Cardizem drip.  Cardiology has assessed patient with recommendations to change Cardizem dosing today.  Electrolyte abnormalities to be corrected. ? ?06/12/21: Patient continues to have elevated heart rates requiring further adjustment of oral Cardizem dose.  Remains off Cardizem drip at this time.  We will also administer 1 dose of IV Lasix per cardiology recommendations on 3/10. ? ?06/13/21: Heart rates are better controlled with increased dose of Cardizem to 90 mg every 6 hours.  Plan to administer 3 more doses and then anticipate cardiology follow-up in a.m. with likely consolidation of dose versus increase after further evaluation. ? ?06/14/21: Heart rates demonstrate worsening control and will require reinitiation of Cardizem drip.  Cardiology following with plans to load digoxin today.  Initially noted to have increased lactic acid levels which are now downtrending.  Evaluating for source of infection. ? ?06/15/21: Patient noted to have UTI with Proteus bacteremia and was started on Rocephin empirically 3/13.  She appears to be showing signs of improvement and will attempt to be weaned off Cardizem drip today per cardiology. ? ?06/16/21: Further cultures pending, appreciate ID consultation.  Patient being weaned off Cardizem drip with increase in oral Cardizem dosing.  Discontinue LR fluid.  Chest x-ray and BNP pending with possible need for further diuresis. ? ?06/17/21: Patient continues to be delirious and antibiotics have been switched to [facility] for ESBL coverage.  Cultures with Enterococcus faecalis and E. coli noted.  ID following.  CT abdomen pelvis  performed with no acute findings.  Cardiology planning for further diuresis today and increase of oral diltiazem for better heart rate control and weaning of IV drip. ? ?06/18/21: Patient continues to have delirium with periods of agitation.  She is to remain on Merrem for total 7 days per ID recommendations.  Repeat blood cultures pending.  She continues to have some electrolyte abnormalities that are being repleted.  Heart rates appear to have better control with high-dose oral Cardizem and metoprolol as ordered.  She is now receiving a small dose of IV fluid given some hypotension. ? ?06/20/21: Patient continues to have periods of confusion.  Could likely plan for discharge back to SNF by 3/20 with 4 more days of IV ertapenem after PICC line placement.  Heart rates remain well controlled. ? ?Assessment and Plan: ?* Atrial fibrillation with RVR ([place]) ?Heart rate controlled well.  Continue Xarelto. ? ?Protein-calorie malnutrition, severe ?Patient appeared severely malnourished with muscle atrophy.  Start Glucerna. ? ?Bacteremia due to Proteus species ?This is a secondary to UTI.  Patient is treated with meropenem for total course of 7 days, last dose 3/24.  ?May consider discharge home on Monday, 3/20, patient can receive 4 more days of IV ertapenem using peripheral line at home. ? ?Pressure injury of skin ?Pressure Injury 05/21/21 Buttocks Right Stage 2 -  Partial thickness loss of dermis presenting as a shallow open injury with a red, pink wound bed without slough. (Active)  ?05/21/21 1131  ?Location: Buttocks  ?Location Orientation: Right  ?Staging: Stage 2 -  Partial thickness loss of dermis presenting as a shallow open injury with a red, pink wound bed without slough.  ?Wound Description (Comments):   ?Present on Admission: Yes  ?   ?Pressure Injury 05/21/21 Buttocks Right Stage 2 -  Partial thickness loss of dermis presenting as a shallow open injury with a red, pink wound bed without slough. (Active)   ?05/21/21 1132  ?Location: Buttocks  ?Location Orientation: Right  ?Staging: Stage 2 -  Partial thickness loss of dermis presenting as a shallow open injury with a red, pink wound bed without slough.  ?Wound Description (Comments):   ?Present on Admission: Yes  ?   ?Pressure Injury 06/10/21 Heel Left Stage 2 -  Partial thickness loss of dermis presenting as a shallow open injury with a red, pink wound bed without slough. (Active)  ?06/10/21 1435  ?Location: Heel  ?Location Orientation: Left (Heel)  ?Staging: Stage 2 -  Partial thickness loss of dermis presenting as a shallow open injury with a red, pink wound bed without slough.  ?Wound Description (Comments):   ?Present on Admission: Yes  ? ?Hypokalemia ?Potassium normalized ?Repeat basic metabolic panel to follow ultralights renal function and stability. ? ?Hypomagnesemia ?Repleted, and is stable at discharge. ?-Continue to follow electrolytes intermittently. ? ?Iliopsoas abscess on left [facility]) ?Antibiotics is completed for this.  ? ?Chronic diastolic (congestive) heart failure (HCC) ?No exacerbation. ?Continue the use of as needed Lasix, follow daily weights and heart healthy diet. ? ?Peripheral vascular disease ([place]) ?Follow-up with PCP as outpatient. ? ?Rheumatoid arthritis ([place]) ?Continue home dose of his steroids and as needed analgesics. ? ?Type 2 diabetes mellitus with hyperlipidemia ([place]) ?Resume home hypoglycemic regimen. ?-Patient advised  to maintain adequate hydration. ? ?Hypertension ?Continue current antihypertensive agents ?-Heart healthy diet discussed with patient. ? ?Pressure injury ?-stage 2 affectic buttocks and sacral area ?-present on admission ?-continue preventive care measures and constant repositioning.  ? ?Consultants: cardiology ID ?Procedures performed: see below for x-ray reports  ?Disposition: Skilled nursing facility ?Diet recommendation: Heart healthy modified carbohydrates diet. ? ?DISCHARGE MEDICATION: ?Allergies as of 06/21/2021    ? ?   Reactions  ? Promethazine   ? ?  ? ?  ?Medication List  ?  ? ?STOP taking these medications   ? ?cefadroxil 500 MG capsule ?Commonly known as: DURICEF ?  ? ?  ? ?TAKE these medications   ? ?acetaminophen

## 2021-06-21 NOTE — Progress Notes (Signed)
Patient being discharged today. Afib rate controlled. Will schedule f/u with Dr. Stanford Breed. ?

## 2021-06-21 NOTE — Progress Notes (Signed)
Peripherally Inserted Central Catheter Placement ? ?The IV Nurse has discussed with the patient and/or persons authorized to consent for the patient, the purpose of this procedure and the potential benefits and risks involved with this procedure.  The benefits include less needle sticks, lab draws from the catheter, and the patient may be discharged home with the catheter. Risks include, but not limited to, infection, bleeding, blood clot (thrombus formation), and puncture of an artery; nerve damage and irregular heartbeat and possibility to perform a PICC exchange if needed/ordered by physician.  Alternatives to this procedure were also discussed.  Bard Power PICC patient education guide, fact sheet on infection prevention and patient information card has been provided to patient /or left at bedside.   ? ?PICC Placement Documentation  ?PICC Single Lumen 70/01/74 Right Basilic 38 cm 0 cm (Active)  ?Indication for Insertion or Continuance of Line Home intravenous therapies (PICC only) 06/21/21 1140  ?Exposed Catheter (cm) 0 cm 06/21/21 1140  ?Site Assessment Clean, Dry, Intact 06/21/21 1140  ?Line Status Flushed;Saline locked;Blood return noted 06/21/21 1140  ?Dressing Type Transparent;Securing device 06/21/21 1140  ?Dressing Status Antimicrobial disc in place;Clean, Dry, Intact 06/21/21 1140  ?Safety Lock Not Applicable 94/49/67 5916  ?Line Adjustment (NICU/IV Team Only) No 06/21/21 1140  ?Dressing Intervention New dressing;Other (Comment) 06/21/21 1140  ?Dressing Change Due 06/28/21 06/21/21 1140  ? ? ?Patient's son, [name], gave consent via telephone. Verified by 2 PICC RNs. ? ? ?Enos Fling ?06/21/2021, 11:43 AM ? ?

## 2021-06-21 NOTE — Progress Notes (Signed)
PHARMACY CONSULT NOTE FOR: ? ?OUTPATIENT  PARENTERAL ANTIBIOTIC THERAPY (OPAT) ? ?Indication: Proteus bacteremia multi-drug resistant ?Regimen: Ertapenem 1gm IV q24h ?End date: 06/25/21 ? ?IV antibiotic discharge orders are pended. ?To discharging provider:  please sign these orders via discharge navigator,  ?Select New Orders & click on the button choice - Manage This Unsigned Work.  ?  ? ?Thank you for allowing pharmacy to be a part of this patient's care. ? ?Cristy Friedlander ?06/21/2021, 4:30 PM  ?

## 2021-06-21 NOTE — Discharge Planning (Signed)
Medication details updated on AVS to reflect all medications given day of discharge. Attemped to call report to [place] x2. EMS given report and AVS documentation. Vital signs checked and stable, PICC line dressing clean, dry, intact, and flushed. ?

## 2021-06-21 NOTE — Care Management Important Message (Signed)
Important Message ? ?Patient Details  ?Name: [name] ?MRN: [id number] ?Date of Birth: 25-Feb-1946 ? ? ?Medicare Important Message Given:  Yes (spoke with son [name] at [phone], no additional copy needed) ? ? ? ? ?Tommy Medal ?06/21/2021, 4:36 PM ?

## 2021-06-23 LAB — CULTURE, BLOOD (ROUTINE X 2)
Culture: NO GROWTH
Culture: NO GROWTH
Special Requests: ADEQUATE
Special Requests: ADEQUATE

## 2021-06-28 ENCOUNTER — Emergency Department (HOSPITAL_COMMUNITY): Payer: 59

## 2021-06-28 ENCOUNTER — Other Ambulatory Visit: Payer: Self-pay

## 2021-06-28 ENCOUNTER — Encounter (HOSPITAL_COMMUNITY): Payer: Self-pay

## 2021-06-28 ENCOUNTER — Inpatient Hospital Stay (HOSPITAL_COMMUNITY)
Admission: EM | Admit: 2021-06-28 | Discharge: 2021-06-30 | DRG: 308 | Disposition: A | Discharge status: Skilled Nursing Facility | Payer: 59 | Source: Skilled Nursing Facility | Attending: Internal Medicine | Admitting: Internal Medicine

## 2021-06-28 DIAGNOSIS — E11621 Type 2 diabetes mellitus with foot ulcer: Secondary | ICD-10-CM | POA: Diagnosis present

## 2021-06-28 DIAGNOSIS — I482 Chronic atrial fibrillation, unspecified: Secondary | ICD-10-CM | POA: Diagnosis present

## 2021-06-28 DIAGNOSIS — D631 Anemia in chronic kidney disease: Secondary | ICD-10-CM | POA: Diagnosis present

## 2021-06-28 DIAGNOSIS — R54 Age-related physical debility: Secondary | ICD-10-CM | POA: Diagnosis present

## 2021-06-28 DIAGNOSIS — I4821 Permanent atrial fibrillation: Secondary | ICD-10-CM | POA: Diagnosis not present

## 2021-06-28 DIAGNOSIS — L97529 Non-pressure chronic ulcer of other part of left foot with unspecified severity: Secondary | ICD-10-CM | POA: Diagnosis present

## 2021-06-28 DIAGNOSIS — L89156 Pressure-induced deep tissue damage of sacral region: Secondary | ICD-10-CM | POA: Diagnosis present

## 2021-06-28 DIAGNOSIS — I10 Essential (primary) hypertension: Secondary | ICD-10-CM | POA: Diagnosis present

## 2021-06-28 DIAGNOSIS — A419 Sepsis, unspecified organism: Secondary | ICD-10-CM | POA: Diagnosis not present

## 2021-06-28 DIAGNOSIS — M069 Rheumatoid arthritis, unspecified: Secondary | ICD-10-CM | POA: Diagnosis present

## 2021-06-28 DIAGNOSIS — E1169 Type 2 diabetes mellitus with other specified complication: Secondary | ICD-10-CM | POA: Diagnosis present

## 2021-06-28 DIAGNOSIS — Z86718 Personal history of other venous thrombosis and embolism: Secondary | ICD-10-CM

## 2021-06-28 DIAGNOSIS — Z888 Allergy status to other drugs, medicaments and biological substances status: Secondary | ICD-10-CM

## 2021-06-28 DIAGNOSIS — Z823 Family history of stroke: Secondary | ICD-10-CM

## 2021-06-28 DIAGNOSIS — Z96643 Presence of artificial hip joint, bilateral: Secondary | ICD-10-CM | POA: Diagnosis present

## 2021-06-28 DIAGNOSIS — Z7952 Long term (current) use of systemic steroids: Secondary | ICD-10-CM

## 2021-06-28 DIAGNOSIS — L97519 Non-pressure chronic ulcer of other part of right foot with unspecified severity: Secondary | ICD-10-CM | POA: Diagnosis present

## 2021-06-28 DIAGNOSIS — R0902 Hypoxemia: Secondary | ICD-10-CM | POA: Diagnosis present

## 2021-06-28 DIAGNOSIS — Z20822 Contact with and (suspected) exposure to covid-19: Secondary | ICD-10-CM | POA: Diagnosis present

## 2021-06-28 DIAGNOSIS — K219 Gastro-esophageal reflux disease without esophagitis: Secondary | ICD-10-CM | POA: Diagnosis present

## 2021-06-28 DIAGNOSIS — I5032 Chronic diastolic (congestive) heart failure: Secondary | ICD-10-CM | POA: Diagnosis present

## 2021-06-28 DIAGNOSIS — E1122 Type 2 diabetes mellitus with diabetic chronic kidney disease: Secondary | ICD-10-CM | POA: Diagnosis present

## 2021-06-28 DIAGNOSIS — Z66 Do not resuscitate: Secondary | ICD-10-CM | POA: Diagnosis present

## 2021-06-28 DIAGNOSIS — E1151 Type 2 diabetes mellitus with diabetic peripheral angiopathy without gangrene: Secondary | ICD-10-CM | POA: Diagnosis present

## 2021-06-28 DIAGNOSIS — E7800 Pure hypercholesterolemia, unspecified: Secondary | ICD-10-CM | POA: Diagnosis present

## 2021-06-28 DIAGNOSIS — Z515 Encounter for palliative care: Secondary | ICD-10-CM | POA: Diagnosis not present

## 2021-06-28 DIAGNOSIS — L8990 Pressure ulcer of unspecified site, unspecified stage: Secondary | ICD-10-CM | POA: Diagnosis present

## 2021-06-28 DIAGNOSIS — N183 Chronic kidney disease, stage 3 unspecified: Secondary | ICD-10-CM | POA: Diagnosis present

## 2021-06-28 DIAGNOSIS — Z794 Long term (current) use of insulin: Secondary | ICD-10-CM

## 2021-06-28 DIAGNOSIS — Z79899 Other long term (current) drug therapy: Secondary | ICD-10-CM

## 2021-06-28 DIAGNOSIS — E43 Unspecified severe protein-calorie malnutrition: Secondary | ICD-10-CM | POA: Diagnosis present

## 2021-06-28 DIAGNOSIS — Z79890 Hormone replacement therapy (postmenopausal): Secondary | ICD-10-CM

## 2021-06-28 DIAGNOSIS — Z7189 Other specified counseling: Secondary | ICD-10-CM | POA: Diagnosis not present

## 2021-06-28 DIAGNOSIS — G9341 Metabolic encephalopathy: Secondary | ICD-10-CM | POA: Diagnosis present

## 2021-06-28 DIAGNOSIS — F419 Anxiety disorder, unspecified: Secondary | ICD-10-CM | POA: Diagnosis present

## 2021-06-28 DIAGNOSIS — Z8249 Family history of ischemic heart disease and other diseases of the circulatory system: Secondary | ICD-10-CM

## 2021-06-28 DIAGNOSIS — I4891 Unspecified atrial fibrillation: Secondary | ICD-10-CM | POA: Diagnosis present

## 2021-06-28 DIAGNOSIS — Z7951 Long term (current) use of inhaled steroids: Secondary | ICD-10-CM | POA: Diagnosis not present

## 2021-06-28 DIAGNOSIS — Z7901 Long term (current) use of anticoagulants: Secondary | ICD-10-CM

## 2021-06-28 DIAGNOSIS — I130 Hypertensive heart and chronic kidney disease with heart failure and stage 1 through stage 4 chronic kidney disease, or unspecified chronic kidney disease: Secondary | ICD-10-CM | POA: Diagnosis present

## 2021-06-28 DIAGNOSIS — I639 Cerebral infarction, unspecified: Secondary | ICD-10-CM | POA: Diagnosis present

## 2021-06-28 DIAGNOSIS — Z6822 Body mass index (BMI) 22.0-22.9, adult: Secondary | ICD-10-CM

## 2021-06-28 DIAGNOSIS — R5383 Other fatigue: Secondary | ICD-10-CM

## 2021-06-28 DIAGNOSIS — Z8673 Personal history of transient ischemic attack (TIA), and cerebral infarction without residual deficits: Secondary | ICD-10-CM

## 2021-06-28 HISTORY — DX: Cerebral infarction, unspecified: I63.9

## 2021-06-28 LAB — CBC WITH DIFFERENTIAL/PLATELET
Abs Immature Granulocytes: 0.1 10*3/uL — ABNORMAL HIGH (ref 0.00–0.07)
Basophils Absolute: 0.1 10*3/uL (ref 0.0–0.1)
Basophils Relative: 1 %
Eosinophils Absolute: 0.2 10*3/uL (ref 0.0–0.5)
Eosinophils Relative: 2 %
HCT: 39.6 % (ref 36.0–46.0)
Hemoglobin: 11.6 g/dL — ABNORMAL LOW (ref 12.0–15.0)
Immature Granulocytes: 1 %
Lymphocytes Relative: 6 %
Lymphs Abs: 0.7 10*3/uL (ref 0.7–4.0)
MCH: 25.8 pg — ABNORMAL LOW (ref 26.0–34.0)
MCHC: 29.3 g/dL — ABNORMAL LOW (ref 30.0–36.0)
MCV: 88.2 fL (ref 80.0–100.0)
Monocytes Absolute: 0.3 10*3/uL (ref 0.1–1.0)
Monocytes Relative: 3 %
Neutro Abs: 9.5 10*3/uL — ABNORMAL HIGH (ref 1.7–7.7)
Neutrophils Relative %: 87 %
Platelets: 503 10*3/uL — ABNORMAL HIGH (ref 150–400)
RBC: 4.49 MIL/uL (ref 3.87–5.11)
RDW: 19.9 % — ABNORMAL HIGH (ref 11.5–15.5)
WBC: 10.9 10*3/uL — ABNORMAL HIGH (ref 4.0–10.5)
nRBC: 0 % (ref 0.0–0.2)

## 2021-06-28 LAB — URINALYSIS, ROUTINE W REFLEX MICROSCOPIC
Bilirubin Urine: NEGATIVE
Glucose, UA: NEGATIVE mg/dL
Hgb urine dipstick: NEGATIVE
Ketones, ur: NEGATIVE mg/dL
Leukocytes,Ua: NEGATIVE
Nitrite: NEGATIVE
Protein, ur: >=300 mg/dL — AB
Specific Gravity, Urine: 1.02 (ref 1.005–1.030)
pH: 5 (ref 5.0–8.0)

## 2021-06-28 LAB — COMPREHENSIVE METABOLIC PANEL
ALT: 15 U/L (ref 0–44)
AST: 19 U/L (ref 15–41)
Albumin: 2.7 g/dL — ABNORMAL LOW (ref 3.5–5.0)
Alkaline Phosphatase: 172 U/L — ABNORMAL HIGH (ref 38–126)
Anion gap: 10 (ref 5–15)
BUN: 21 mg/dL (ref 8–23)
CO2: 23 mmol/L (ref 22–32)
Calcium: 8.9 mg/dL (ref 8.9–10.3)
Chloride: 111 mmol/L (ref 98–111)
Creatinine, Ser: 0.7 mg/dL (ref 0.44–1.00)
GFR, Estimated: >60 mL/min (ref >60)
Glucose, Bld: 140 mg/dL — ABNORMAL HIGH (ref 70–99)
Potassium: 3.5 mmol/L (ref 3.5–5.1)
Sodium: 144 mmol/L (ref 135–145)
Total Bilirubin: 0.9 mg/dL (ref 0.3–1.2)
Total Protein: 6.8 g/dL (ref 6.5–8.1)

## 2021-06-28 LAB — RESP PANEL BY RT-PCR (FLU A&B, COVID) ARPGX2
Influenza A by PCR: NEGATIVE
Influenza B by PCR: NEGATIVE
SARS Coronavirus 2 by RT PCR: NEGATIVE

## 2021-06-28 LAB — LACTIC ACID, PLASMA
Lactic Acid, Venous: 1.6 mmol/L (ref 0.5–1.9)
Lactic Acid, Venous: 1.4 mmol/L (ref 0.5–1.9)

## 2021-06-28 LAB — TROPONIN I (HIGH SENSITIVITY)
Troponin I (High Sensitivity): 7 ng/L (ref <18)
Troponin I (High Sensitivity): 8 ng/L (ref <18)

## 2021-06-28 LAB — PROTIME-INR
INR: 1.2 (ref 0.8–1.2)
Prothrombin Time: 15.4 seconds — ABNORMAL HIGH (ref 11.4–15.2)

## 2021-06-28 MED ORDER — ENSURE ENLIVE PO LIQD
237.0000 mL | Freq: Two times a day (BID) | ORAL | Status: DC
  Administered 2021-06-29: 237 mL via ORAL
  Filled 2021-06-28: qty 237

## 2021-06-28 MED ORDER — ALBUTEROL SULFATE (2.5 MG/3ML) 0.083% IN NEBU: 2.5000 mg | INHALATION_SOLUTION | RESPIRATORY_TRACT | Status: DC | PRN

## 2021-06-28 MED ORDER — LACTATED RINGERS IV BOLUS (SEPSIS)
1000.0000 mL | Freq: Once | INTRAVENOUS | Status: AC
  Administered 2021-06-28: 1000 mL via INTRAVENOUS

## 2021-06-28 MED ORDER — PANTOPRAZOLE SODIUM 40 MG PO TBEC
40.0000 mg | DELAYED_RELEASE_TABLET | Freq: Every day | ORAL | Status: DC
  Administered 2021-06-28 – 2021-06-30 (×3): 40 mg via ORAL
  Filled 2021-06-28 (×3): qty 1

## 2021-06-28 MED ORDER — VANCOMYCIN HCL 1500 MG/300ML IV SOLN
1500.0000 mg | INTRAVENOUS | Status: DC
  Administered 2021-06-29: 1500 mg via INTRAVENOUS
  Filled 2021-06-28: qty 300

## 2021-06-28 MED ORDER — MAGNESIUM OXIDE -MG SUPPLEMENT 400 (240 MG) MG PO TABS
400.0000 mg | ORAL_TABLET | Freq: Every day | ORAL | Status: DC
  Administered 2021-06-29 – 2021-06-30 (×2): 400 mg via ORAL
  Filled 2021-06-28 (×5): qty 1

## 2021-06-28 MED ORDER — RIVAROXABAN 20 MG PO TABS
20.0000 mg | ORAL_TABLET | Freq: Every day | ORAL | Status: DC
  Administered 2021-06-29: 20 mg via ORAL
  Filled 2021-06-28: qty 1

## 2021-06-28 MED ORDER — METOPROLOL TARTRATE 50 MG PO TABS
100.0000 mg | ORAL_TABLET | Freq: Two times a day (BID) | ORAL | Status: DC
  Administered 2021-06-29 – 2021-06-30 (×3): 100 mg via ORAL
  Filled 2021-06-28 (×3): qty 2

## 2021-06-28 MED ORDER — ICOSAPENT ETHYL 1 G PO CAPS
1.0000 g | ORAL_CAPSULE | Freq: Every morning | ORAL | Status: DC
  Administered 2021-06-29: 1 g via ORAL
  Filled 2021-06-28 (×4): qty 1

## 2021-06-28 MED ORDER — PRO-STAT 64 PO LIQD: 30.0000 mL | Freq: Every morning | ORAL | Status: DC

## 2021-06-28 MED ORDER — VANCOMYCIN HCL 1750 MG/350ML IV SOLN
1750.0000 mg | Freq: Once | INTRAVENOUS | Status: AC
  Administered 2021-06-28: 1750 mg via INTRAVENOUS
  Filled 2021-06-28: qty 350

## 2021-06-28 MED ORDER — ZINC SULFATE 220 (50 ZN) MG PO CAPS
220.0000 mg | ORAL_CAPSULE | Freq: Every day | ORAL | Status: DC
  Administered 2021-06-29 – 2021-06-30 (×2): 220 mg via ORAL
  Filled 2021-06-28 (×3): qty 1

## 2021-06-28 MED ORDER — CYCLOSPORINE 0.05 % OP EMUL
1.0000 [drp] | Freq: Two times a day (BID) | OPHTHALMIC | Status: DC
  Administered 2021-06-29 – 2021-06-30 (×3): 1 [drp] via OPHTHALMIC
  Filled 2021-06-28 (×3): qty 30

## 2021-06-28 MED ORDER — LACTATED RINGERS IV SOLN: INTRAVENOUS | Status: DC

## 2021-06-28 MED ORDER — ATORVASTATIN CALCIUM 20 MG PO TABS
20.0000 mg | ORAL_TABLET | Freq: Every day | ORAL | Status: DC
  Administered 2021-06-28 – 2021-06-30 (×3): 20 mg via ORAL
  Filled 2021-06-28: qty 1
  Filled 2021-06-28: qty 2
  Filled 2021-06-28: qty 1

## 2021-06-28 MED ORDER — SODIUM CHLORIDE 0.9 % IV SOLN
2.0000 g | Freq: Once | INTRAVENOUS | Status: DC
  Filled 2021-06-28: qty 2

## 2021-06-28 MED ORDER — POLYVINYL ALCOHOL 1.4 % OP SOLN
2.0000 [drp] | Freq: Every day | OPHTHALMIC | Status: DC
  Administered 2021-06-29 – 2021-06-30 (×2): 2 [drp] via OPHTHALMIC
  Filled 2021-06-28: qty 15

## 2021-06-28 MED ORDER — PREDNISONE 10 MG PO TABS
5.0000 mg | ORAL_TABLET | Freq: Every day | ORAL | Status: DC
  Administered 2021-06-28 – 2021-06-29 (×2): 5 mg via ORAL
  Filled 2021-06-28 (×2): qty 1

## 2021-06-28 MED ORDER — PROSOURCE PLUS PO LIQD
30.0000 mL | Freq: Every day | ORAL | Status: DC
  Filled 2021-06-28 (×3): qty 30

## 2021-06-28 MED ORDER — GABAPENTIN 400 MG PO CAPS
400.0000 mg | ORAL_CAPSULE | Freq: Two times a day (BID) | ORAL | Status: DC
  Administered 2021-06-29 – 2021-06-30 (×3): 400 mg via ORAL
  Filled 2021-06-28 (×3): qty 1

## 2021-06-28 MED ORDER — KETOTIFEN FUMARATE 0.025 % OP SOLN
1.0000 [drp] | Freq: Two times a day (BID) | OPHTHALMIC | Status: DC
  Administered 2021-06-29 – 2021-06-30 (×2): 1 [drp] via OPHTHALMIC
  Filled 2021-06-28 (×2): qty 5

## 2021-06-28 MED ORDER — ASCORBIC ACID 500 MG PO TABS
500.0000 mg | ORAL_TABLET | Freq: Every day | ORAL | Status: DC
  Administered 2021-06-29 – 2021-06-30 (×2): 500 mg via ORAL
  Filled 2021-06-28 (×3): qty 1

## 2021-06-28 MED ORDER — DILTIAZEM LOAD VIA INFUSION
15.0000 mg | Freq: Once | INTRAVENOUS | Status: AC
  Administered 2021-06-28: 15 mg via INTRAVENOUS
  Filled 2021-06-28: qty 15

## 2021-06-28 MED ORDER — DILTIAZEM HCL-DEXTROSE 125-5 MG/125ML-% IV SOLN (PREMIX)
5.0000 mg/h | INTRAVENOUS | Status: DC
  Administered 2021-06-28: 5 mg/h via INTRAVENOUS
  Administered 2021-06-29: 12.5 mg/h via INTRAVENOUS
  Filled 2021-06-28 (×3): qty 125

## 2021-06-28 MED ORDER — MOMETASONE FURO-FORMOTEROL FUM 200-5 MCG/ACT IN AERO
2.0000 | INHALATION_SPRAY | Freq: Two times a day (BID) | RESPIRATORY_TRACT | Status: DC
  Administered 2021-06-29 – 2021-06-30 (×3): 2 via RESPIRATORY_TRACT
  Filled 2021-06-28: qty 8.8

## 2021-06-28 MED ORDER — PRESERVISION AREDS 2 PO CAPS: 1.0000 | ORAL_CAPSULE | Freq: Every day | ORAL | Status: DC

## 2021-06-28 MED ORDER — SACCHAROMYCES BOULARDII 250 MG PO CAPS
250.0000 mg | ORAL_CAPSULE | Freq: Two times a day (BID) | ORAL | Status: DC
  Administered 2021-06-29 – 2021-06-30 (×3): 250 mg via ORAL
  Filled 2021-06-28 (×3): qty 1

## 2021-06-28 MED ORDER — SENNA 8.6 MG PO TABS
1.0000 | ORAL_TABLET | Freq: Every day | ORAL | Status: DC
  Administered 2021-06-30: 8.6 mg via ORAL
  Filled 2021-06-28 (×2): qty 1

## 2021-06-28 MED ORDER — ADULT MULTIVITAMIN W/MINERALS CH
1.0000 | ORAL_TABLET | Freq: Every day | ORAL | Status: DC
  Administered 2021-06-29 – 2021-06-30 (×2): 1 via ORAL
  Filled 2021-06-28 (×2): qty 1

## 2021-06-28 MED ORDER — BUPROPION HCL ER (SR) 100 MG PO TB12
100.0000 mg | ORAL_TABLET | Freq: Two times a day (BID) | ORAL | Status: DC
  Administered 2021-06-29 – 2021-06-30 (×3): 100 mg via ORAL
  Filled 2021-06-28 (×7): qty 1

## 2021-06-28 MED ORDER — CARBOXYMETHYLCELLULOSE SODIUM 1 % OP SOLN: 2.0000 [drp] | Freq: Every morning | OPHTHALMIC | Status: DC

## 2021-06-28 MED ORDER — LACTATED RINGERS IV BOLUS (SEPSIS)
250.0000 mL | Freq: Once | INTRAVENOUS | Status: AC
  Administered 2021-06-28: 250 mL via INTRAVENOUS

## 2021-06-28 MED ORDER — CALCIUM CARBONATE 1250 (500 CA) MG PO TABS
1.0000 | ORAL_TABLET | Freq: Every day | ORAL | Status: DC
  Administered 2021-06-29 – 2021-06-30 (×2): 500 mg via ORAL
  Filled 2021-06-28 (×5): qty 1

## 2021-06-28 MED ORDER — POLYETHYLENE GLYCOL 3350 17 G PO PACK: 17.0000 g | PACK | Freq: Two times a day (BID) | ORAL | Status: DC | PRN

## 2021-06-28 MED ORDER — ACETAMINOPHEN 650 MG RE SUPP
650.0000 mg | Freq: Once | RECTAL | Status: AC
  Administered 2021-06-28: 650 mg via RECTAL
  Filled 2021-06-28: qty 1

## 2021-06-28 MED ORDER — IOHEXOL 300 MG/ML  SOLN
100.0000 mL | Freq: Once | INTRAMUSCULAR | Status: AC | PRN
  Administered 2021-06-28: 100 mL via INTRAVENOUS

## 2021-06-28 MED ORDER — ALBUTEROL SULFATE HFA 108 (90 BASE) MCG/ACT IN AERS: 2.0000 | INHALATION_SPRAY | RESPIRATORY_TRACT | Status: DC | PRN

## 2021-06-28 MED ORDER — PROSIGHT PO TABS
1.0000 | ORAL_TABLET | Freq: Every day | ORAL | Status: DC
  Filled 2021-06-28 (×3): qty 1

## 2021-06-28 MED ORDER — SODIUM CHLORIDE 0.9 % IV SOLN
1.0000 g | Freq: Three times a day (TID) | INTRAVENOUS | Status: DC
  Administered 2021-06-28 – 2021-06-30 (×5): 1 g via INTRAVENOUS
  Filled 2021-06-28 (×5): qty 20

## 2021-06-28 NOTE — ED Triage Notes (Signed)
Patient from [facility] with reports of fever, low oxygen sats, tachycardia and AMS. Per EMS patient has been in this condition for 2-7 days, staff not able to answer many questions.  ?

## 2021-06-28 NOTE — ED Notes (Signed)
Pt HR remaining consisently in the 130s, titrated cardizem drip to 12.5, HR still consistently in the 130s--MD made aware  ?

## 2021-06-28 NOTE — Progress Notes (Signed)
Pharmacy Antibiotic Note ? ?[name] is a 76 y.o. female admitted on 06/28/2021 with infection of unknown source (previously consulted for UTI/sepsis) Pharmacy has been consulted for vancomycin and cefepime dosing. ? ?Patient with h/o ESBL per infection control flags. Placed on meropenem earlier today. D/w MD, will d/c cefepime consult and continue meropenem. Will start vancomycin for MRSA coverage.  ? ?Plan: ?Vancomycin '1750mg'$  IV x 1  ?Then Vancomycin '1500mg'$  IV q24h  ?Est AUC 491 (goal 400-600), Scr used 0.7 ?D/c cefepime consult ?Continue meropenem 1gm IV q8h ? ? ?Height: '5\' 7"'$  (170.2 cm) ?Weight: 71.1 kg (156 lb 12 oz) ?IBW/kg (Calculated) : 61.6 ? ?Temp (24hrs), Avg:101.1 ?F (38.4 ?C), Min:98.3 ?F (36.8 ?C), Max:103.8 ?F (39.9 ?C) ? ?Recent Labs  ?Lab 06/28/21 ?1525 06/28/21 ?1745  ?WBC 10.9*  --   ?CREATININE 0.70  --   ?LATICACIDVEN 1.6 1.4  ?  ?Estimated Creatinine Clearance: 58.2 mL/min (by C-G formula based on SCr of 0.7 mg/dL).   ? ?Allergies  ?Allergen Reactions  ? Promethazine   ? ? ?Antimicrobials this admission: ?3/27 vancomycin >>  ?3/27 meropenem >>  ? ?Dose adjustments this admission: ?N/a ? ?Microbiology results: ?3/27 BCx: NGTD ?3/27 UCx: collected  ? ? ?Rama Mcclintock A. Levada Dy, PharmD, BCPS, FNKF ?Clinical Pharmacist ?[place] ?Please utilize Amion for appropriate phone number to reach the unit pharmacist ([place]) ? ?06/28/2021 8:16 PM ? ?

## 2021-06-28 NOTE — ED Notes (Signed)
DNR band placed on pt ?

## 2021-06-28 NOTE — Sepsis Progress Note (Signed)
Sepsis protocol is being followed by [facility]. 

## 2021-06-28 NOTE — Assessment & Plan Note (Signed)
Likely d/t sepsis ?? Swallow eval pending ?

## 2021-06-28 NOTE — Assessment & Plan Note (Signed)
Currently controlled  ?? On cardizem gtt for Afib/RVR, titrate down/off and transition to home po cardizem as able  ?? Maintain home beta blocker  ?

## 2021-06-28 NOTE — Progress Notes (Signed)
Pharmacy Antibiotic Note ? ?[name] is a 76 y.o. female admitted on 06/28/2021 with sepsis/UTI  Pharmacy has been consulted for Merrem dosing. ? ?Plan: ?Merrem 1gm IV Q8h ?F/U cxs and clinical progress ?Monitor V/S, labs  ? ?Height: '5\' 7"'$  (170.2 cm) ?Weight: 71.1 kg (156 lb 12 oz) ?IBW/kg (Calculated) : 61.6 ? ?Temp (24hrs), Avg:103.8 ?F (39.9 ?C), Min:103.8 ?F (39.9 ?C), Max:103.8 ?F (39.9 ?C) ? ?Recent Labs  ?Lab 06/28/21 ?1525  ?WBC 10.9*  ?  ?Estimated Creatinine Clearance: 58.2 mL/min (by C-G formula based on SCr of 0.66 mg/dL).   ? ?Allergies  ?Allergen Reactions  ? Promethazine   ? ? ?Antimicrobials this admission: ?merrem 3/27 >>  ? ?Microbiology results: ?3/27 BCx: pending ?3/27 UCx: pending  ? ?Thank you for allowing pharmacy to be a part of this patient?s care. ? ?Isac Sarna, BS Pharm D, BCPS ?Clinical Pharmacist ?Pager [phone] ?06/28/2021 4:25 PM ? ?

## 2021-06-28 NOTE — H&P (Addendum)
HISTORY AND PHYSICAL  Patient: [name] 76 y.o. female MRN: [id number]  Today is hospital day 0 after presenting to ED on 06/28/2021  3:29 PM with  Chief Complaint  Patient presents with   Weakness     RECORD REVIEW AND HOSPITAL COURSE: Patient brought to ED 06/28/2021 via EMS from SNF with reports of increased lethargy for "a few days."  No other information available.  Per ED provider notes, patient will open eyes to voice although does not participate in exam or history.   In ED, patient tachypneic, hypoxic, febrile.  Code sepsis activated.  Patient was given multiple fluid boluses with lactated Ringer's, given Tylenol, started on Cardizem drip, started on cefepime and meropenem broad-spectrum antibiotics.  Recent admission for UTI, urinalysis ED no definitive UTI, WBC 10.9, hemoglobin recently 9.5, 11.6 now, chest x-ray showing minimal bibasilar atelectasis, CT chest, abdomen, pelvis no obvious intrathoracic or intra-abdominal infectious process, CT head no acute intracranial process.  Procedures and Significant Results:  See ED course, see imaging results as below   Consultants:  none    SUBJECTIVE:  Unable to assess from patient given rousable but non-participatory     ASSESSMENT & PLAN  Sepsis due to undetermined organism (HCC) Most likely UTI, though current UA is unimpressive. Possible SIRS without infection but this seems less likely. Neg Flu/COVID and CT chest has reasonably eliminated pneumonia or other intrathoracic process, CT abdomen/pelvis has reasonably eliminated intraabdominal infection however abnormal findings of possible significance noted below  Continue broad spectrum antibiotics, IV fluids  Renal cyst/mass noted on CT, will obtain renal US, CT not c/w renal abscess though this may be possible given recent UTI though neoplastic process is more concern based on radiology report  L adnexal attenuation on CT possible uterine fibroid vs ovarian  process, recommended non emergent pelvic US, consider this if/when patient or family is able to provide consent for TVUS, but abscess seems unlikely   Chronic atrial fibrillation with RVR (HCC) Maintain cardizem gtt and telemetry Maintain Xarelto    Chronic diastolic (congestive) heart failure (HCC) Not in exacerbation, last Echo 05/21/2021 no concerns and EF WNL  Rheumatoid arthritis (HCC) Continue home prednisone   Type 2 diabetes mellitus with hyperlipidemia (HCC) Maintain SSI  Hypertension Currently controlled  On cardizem gtt for Afib/RVR, titrate down/off and transition to home po cardizem as able  Maintain home beta blocker   Anemia of chronic renal failure, stage 3 (moderate) (HCC) No s/s bleeding, pt is on chronic anticoagulation monitro routine CBC  Gastroesophageal reflux disease Continue PPI  Acute metabolic encephalopathy Likely d/t sepsis Swallow eval pending  Pressure injury of skin Minimal erythema, no obvious cellulitis Wound care consulted Continue broad spectrum abx   Protein-calorie malnutrition, severe Confers poor prognosis if not able to optimize diet     VTE Ppx: continue Xarelto CODE STATUS: DNR  multiple attempts to reach son and brother were unsuccessful, she was not sent from SNF with any advanced directive / DNR / MOST documentation on most recent admission which was 10 days ago there seemed to be frequent communication with her son and she was DNR throughout that stay and other stays based on 1) review of medical record with consistent previous DNR orders and family communication, 2) surrogate decision maker not reasonably available, 3) high risk / unlikelihood of meaningful benefit with aggressive resuscitation efforts given her current clinical condition, 4) discussion with the ED physician (see Dr. Silvana Newness note), --> opted to continue with DNR  Admitted from: SNF Expected Dispo: SNF Barriers to discharge: continued medical workup and  treatment  Family communication: see above - legal NOK / surrogate decision maker was not reasonably available                Past Medical History:  Diagnosis Date   Abnormal LFTs 07/24/2017   Actinic keratosis    Anemia    hx of   Anemia of chronic renal failure, stage 3 (moderate) (HCC) 07/17/2015   Anticoagulant causing adverse effect in therapeutic use 07/17/2015   Anxiety    Arthritis    rheumatoid   Atrial fibrillation (HCC)    a. persistent, she remains on Xarelto   Carpal tunnel syndrome    Chronic diastolic (congestive) heart failure (HCC)    a. 04/2016: echo showing a preserved EF of 55-60% with mild MR. LA and RA midly dilated.    Edema    left leg at ankle resolved now   Gastroesophageal reflux disease    Hepatitis 1970   not sure what kind   Hiatal hernia    Hyperlipidemia    Hypertension    Iron deficiency anemia due to chronic blood loss 07/17/2015   Jaundice    age 50   Peripheral vascular disease (HCC) yrs ago   DVT left lower leg questionale told by 2 drs i had no clot, 1 md said i did   Rheumatoid arthritis(714.0)    Right knee pain    Stroke (HCC)    Type II diabetes mellitus (HCC)    type2    Past Surgical History:  Procedure Laterality Date   CATARACT EXTRACTION Bilateral    COLONOSCOPY WITH PROPOFOL N/A 02/20/2015   Procedure: COLONOSCOPY WITH PROPOFOL;  Surgeon: Jeani Hawking, MD;  Location: [facility];  Service: Endoscopy;  Laterality: N/A;   ENTEROSCOPY N/A 03/23/2016   Procedure: ENTEROSCOPY;  Surgeon: Jeani Hawking, MD;  Location: [facility];  Service: Endoscopy;  Laterality: N/A;   ENTEROSCOPY N/A 06/17/2016   Procedure: ENTEROSCOPY;  Surgeon: Jeani Hawking, MD;  Location: [facility];  Service: Endoscopy;  Laterality: N/A;   ESOPHAGOGASTRODUODENOSCOPY (EGD) WITH PROPOFOL N/A 02/20/2015   Procedure: ESOPHAGOGASTRODUODENOSCOPY (EGD) WITH PROPOFOL;  Surgeon: Jeani Hawking, MD;  Location: [facility];  Service: Endoscopy;  Laterality:  N/A;   GIVENS CAPSULE STUDY N/A 03/21/2016   Procedure: GIVENS CAPSULE STUDY;  Surgeon: Jeani Hawking, MD;  Location: [facility];  Service: Endoscopy;  Laterality: N/A;   HAND SURGERY  1995 and 1996   artificial joints both hands   JOINT REPLACEMENT     LUMBAR LAMINECTOMY/DECOMPRESSION MICRODISCECTOMY N/A 02/10/2016   Procedure: MICROLUMBAR DECOMPRESSION L4-L5 AND L3- L4, AND EXCISION OF SYNOVIAL CYST L4-L5;  Surgeon: Jene Every, MD;  Location: [facility];  Service: Orthopedics;  Laterality: N/A;   TOTAL HIP ARTHROPLASTY Left 2009   TOTAL HIP ARTHROPLASTY Right 04/03/2013   Procedure: RIGHT TOTAL HIP ARTHROPLASTY ANTERIOR APPROACH;  Surgeon: Loanne Drilling, MD;  Location: [facility];  Service: Orthopedics;  Laterality: Right;    Family History  Problem Relation Age of Onset   Pneumonia Mother 29   Stroke Father 41   Heart failure Sister    Hypertension Sister    Heart attack Neg Hx    Social History:  reports that she has never smoked. She has never used smokeless tobacco. She reports that she does not drink alcohol and does not use drugs.  Allergies:  Allergies  Allergen Reactions   Promethazine     No current facility-administered  medications on file prior to encounter.   Current Outpatient Medications on File Prior to Encounter  Medication Sig Dispense Refill   acetaminophen (TYLENOL) 650 MG CR tablet Take 1,300 mg by mouth in the morning and at bedtime.     albuterol (VENTOLIN HFA) 108 (90 Base) MCG/ACT inhaler Inhale 2 puffs into the lungs every 4 (four) hours as needed for wheezing or shortness of breath.     Amino Acids-Protein Hydrolys (FEEDING SUPPLEMENT, PRO-STAT 64,) LIQD Take 30 mLs by mouth in the morning.     Ascorbic Acid (VITAMIN C PO) Take 500 mg by mouth in the morning and at bedtime.     atorvastatin (LIPITOR) 20 MG tablet Take 20 mg by mouth daily.     azelastine (OPTIVAR) 0.05 % ophthalmic solution Place 1 drop into both eyes 2 (two) times daily.     buPROPion  ER (WELLBUTRIN SR) 100 MG 12 hr tablet Take 1 tablet (100 mg total) by mouth 2 (two) times daily.     Calcium Carbonate-Vit D-Min (CALCIUM 1200) 1200-1000 MG-UNIT CHEW 1 tablet with a meal     carboxymethylcellulose (ARTIFICIAL TEARS) 1 % ophthalmic solution Place 2 drops into both eyes in the morning.     cetirizine (ZYRTEC) 10 MG tablet 1 tablet     cycloSPORINE (RESTASIS) 0.05 % ophthalmic emulsion Place 1 drop into both eyes 2 (two) times daily.     diclofenac Sodium (VOLTAREN) 1 % GEL Apply 2 g topically at bedtime as needed for pain.     diltiazem (CARDIZEM CD) 360 MG 24 hr capsule Take 1 capsule (360 mg total) by mouth daily. 30 capsule 11   Eyelid Cleansers (OCUSOFT LID SCRUB ORIGINAL) PADS Apply 1 application topically in the morning and at bedtime. Apply to both eyelids for eye problem     feeding supplement (ENSURE ENLIVE / ENSURE PLUS) LIQD Take 237 mLs by mouth 2 (two) times daily between meals.     fluticasone-salmeterol (ADVAIR HFA) 115-21 MCG/ACT inhaler Inhale 2 puffs into the lungs 2 times daily at 12 noon and 4 pm.     furosemide (LASIX) 40 MG tablet Take 0.5 tablets (20 mg total) by mouth daily as needed for fluid or edema.     gabapentin (NEURONTIN) 100 MG capsule Take 3 capsules (300 mg total) by mouth 3 (three) times daily. (Patient taking differently: Take 400 mg by mouth 2 (two) times daily.)     insulin glargine (LANTUS) 100 UNIT/ML injection Inject 30 Units into the skin in the morning. Hold if BS is less than 150     insulin lispro (HUMALOG) 100 UNIT/ML injection Inject 6 Units into the skin in the morning and at bedtime. Hold is BS <100     magnesium oxide (MAG-OX) 400 MG tablet Take 400 mg by mouth daily.     metFORMIN (GLUCOPHAGE-XR) 500 MG 24 hr tablet Take 1,000 mg by mouth 2 (two) times daily.     metoprolol tartrate (LOPRESSOR) 100 MG tablet Take 1 tablet (100 mg total) by mouth 2 (two) times daily.     Multiple Vitamin (MULTIVITAMIN) tablet Take 1 tablet by mouth  daily.     Multiple Vitamins-Minerals (PRESERVISION AREDS 2) CAPS Take 1 capsule by mouth daily.      nitroGLYCERIN (NITROSTAT) 0.4 MG SL tablet Place 1 tablet (0.4 mg total) under the tongue every 5 (five) minutes as needed for chest pain (x 3 doses). 25 tablet 2   omeprazole (PRILOSEC) 20 MG capsule Take 1 capsule  by mouth daily.     oxyCODONE (OXY IR/ROXICODONE) 5 MG immediate release tablet Take 1.5 tablets (7.5 mg total) by mouth every 8 (eight) hours as needed for severe pain. 20 tablet 0   polyethylene glycol (MIRALAX / GLYCOLAX) 17 g packet Take 17 g by mouth 2 (two) times daily as needed for mild constipation.     predniSONE (DELTASONE) 5 MG tablet Take 5 mg by mouth daily.     rivaroxaban (XARELTO) 20 MG TABS tablet Take 1 tablet (20 mg total) by mouth daily at 6 PM. 30 tablet 2   saccharomyces boulardii (FLORASTOR) 250 MG capsule Take 250 mg by mouth 2 (two) times daily.     senna (SENOKOT) 8.6 MG TABS tablet Take 1 tablet by mouth daily.     VASCEPA 1 g CAPS Take 1 capsule by mouth every morning.     zinc sulfate 220 (50 Zn) MG capsule Take 220 mg by mouth daily.     [DISCONTINUED] ferrous gluconate (FERGON) 324 MG tablet Take 1 tablet (324 mg total) by mouth 2 (two) times daily with a meal. (Patient not taking: Reported on 03/30/2020) 60 tablet 3   [DISCONTINUED] potassium chloride SA (K-DUR,KLOR-CON) 20 MEQ tablet Take 1 tablet (20 mEq total) by mouth 2 (two) times daily. (Patient not taking: Reported on 03/30/2020)       Results for orders placed or performed during the hospital encounter of 06/28/21 (from the past 48 hour(s))  Comprehensive metabolic panel     Status: Abnormal   Collection Time: 06/28/21  3:25 PM  Result Value Ref Range   Sodium 144 135 - 145 mmol/L   Potassium 3.5 3.5 - 5.1 mmol/L   Chloride 111 98 - 111 mmol/L   CO2 23 22 - 32 mmol/L   Glucose, Bld 140 (H) 70 - 99 mg/dL    Comment: Glucose reference range applies only to samples taken after fasting for at  least 8 hours.   BUN 21 8 - 23 mg/dL   Creatinine, Ser 1.61 0.44 - 1.00 mg/dL   Calcium 8.9 8.9 - 09.6 mg/dL   Total Protein 6.8 6.5 - 8.1 g/dL   Albumin 2.7 (L) 3.5 - 5.0 g/dL   AST 19 15 - 41 U/L   ALT 15 0 - 44 U/L   Alkaline Phosphatase 172 (H) 38 - 126 U/L   Total Bilirubin 0.9 0.3 - 1.2 mg/dL   GFR, Estimated >04 >54 mL/min    Comment: (NOTE) Calculated using the CKD-EPI Creatinine Equation (2021)    Anion gap 10 5 - 15    Comment: Performed at [facility], [address] [place], [place] [address]  Lactic acid, plasma     Status: None   Collection Time: 06/28/21  3:25 PM  Result Value Ref Range   Lactic Acid, Venous 1.6 0.5 - 1.9 mmol/L    Comment: Performed at [facility], [address], [place], [place] [address]  CBC with Differential     Status: Abnormal   Collection Time: 06/28/21  3:25 PM  Result Value Ref Range   WBC 10.9 (H) 4.0 - 10.5 K/uL   RBC 4.49 3.87 - 5.11 MIL/uL   Hemoglobin 11.6 (L) 12.0 - 15.0 g/dL   HCT 29.5 62.1 - 30.8 %   MCV 88.2 80.0 - 100.0 fL   MCH 25.8 (L) 26.0 - 34.0 pg   MCHC 29.3 (L) 30.0 - 36.0 g/dL   RDW 65.7 (H) 84.6 - 96.2 %   Platelets 503 (  H) 150 - 400 K/uL   nRBC 0.0 0.0 - 0.2 %   Neutrophils Relative % 87 %   Neutro Abs 9.5 (H) 1.7 - 7.7 K/uL   Lymphocytes Relative 6 %   Lymphs Abs 0.7 0.7 - 4.0 K/uL   Monocytes Relative 3 %   Monocytes Absolute 0.3 0.1 - 1.0 K/uL   Eosinophils Relative 2 %   Eosinophils Absolute 0.2 0.0 - 0.5 K/uL   Basophils Relative 1 %   Basophils Absolute 0.1 0.0 - 0.1 K/uL   Immature Granulocytes 1 %   Abs Immature Granulocytes 0.10 (H) 0.00 - 0.07 K/uL    Comment: Performed at [facility], [address] [place], [place] [address]  Protime-INR     Status: Abnormal   Collection Time: 06/28/21  3:25 PM  Result Value Ref Range   Prothrombin Time 15.4 (H) 11.4 - 15.2 seconds   INR 1.2 0.8 - 1.2    Comment: (NOTE) INR goal varies based on device and disease states. Performed at [facility],  [address] [place], [place] [address]   Culture, blood (Routine x 2)     Status: None (Preliminary result)   Collection Time: 06/28/21  3:25 PM   Specimen: BLOOD RIGHT FOREARM  Result Value Ref Range   Specimen Description BLOOD RIGHT FOREARM    Special Requests      BOTTLES DRAWN AEROBIC AND ANAEROBIC Blood Culture adequate volume Performed at [facility], [address] [place], [place] [address]    Culture PENDING    Report Status PENDING   Troponin I (High Sensitivity)     Status: None   Collection Time: 06/28/21  3:25 PM  Result Value Ref Range   Troponin I (High Sensitivity) 7 <18 ng/L    Comment: (NOTE) Elevated high sensitivity troponin I (hsTnI) values and significant  changes across serial measurements may suggest ACS but many other  chronic and acute conditions are known to elevate hsTnI results.  Refer to the "Links" section for chest pain algorithms and additional  guidance. Performed at [facility], [address] [place], [place] [address]   Culture, blood (Routine x 2)     Status: None (Preliminary result)   Collection Time: 06/28/21  3:37 PM   Specimen: Left Antecubital; Blood  Result Value Ref Range   Specimen Description LEFT ANTECUBITAL    Special Requests      BOTTLES DRAWN AEROBIC AND ANAEROBIC Blood Culture adequate volume Performed at [facility], [address] [place], [place] [address]    Culture PENDING    Report Status PENDING   Resp Panel by RT-PCR (Flu A&B, Covid) Nasopharyngeal Swab     Status: None   Collection Time: 06/28/21  3:41 PM   Specimen: Nasopharyngeal Swab; Nasopharyngeal(NP) swabs in vial transport medium  Result Value Ref Range   SARS Coronavirus 2 by RT PCR NEGATIVE NEGATIVE    Comment: (NOTE) SARS-CoV-2 target nucleic acids are NOT DETECTED.  The SARS-CoV-2 RNA is generally detectable in upper respiratory specimens during the acute phase of infection. The lowest concentration of SARS-CoV-2 viral copies this assay can detect  is 138 copies/mL. A negative result does not preclude SARS-Cov-2 infection and should not be used as the sole basis for treatment or other patient management decisions. A negative result may occur with  improper specimen collection/handling, submission of specimen other than nasopharyngeal swab, presence of viral mutation(s) within the areas targeted by this assay, and inadequate number of viral copies(<138 copies/mL). A negative  result must be combined with clinical observations, patient history, and epidemiological information. The expected result is Negative.  Fact Sheet for Patients:  BloggerCourse.com  Fact Sheet for Healthcare Providers:  SeriousBroker.it  This test is no t yet approved or cleared by the [place] FDA and  has been authorized for detection and/or diagnosis of SARS-CoV-2 by FDA under an Emergency Use Authorization (EUA). This EUA will remain  in effect (meaning this test can be used) for the duration of the COVID-19 declaration under Section 564(b)(1) of the Act, 21 U.S.C.section 360bbb-3(b)(1), unless the authorization is terminated  or revoked sooner.       Influenza A by PCR NEGATIVE NEGATIVE   Influenza B by PCR NEGATIVE NEGATIVE    Comment: (NOTE) The Xpert Xpress SARS-CoV-2/FLU/RSV plus assay is intended as an aid in the diagnosis of influenza from Nasopharyngeal swab specimens and should not be used as a sole basis for treatment. Nasal washings and aspirates are unacceptable for Xpert Xpress SARS-CoV-2/FLU/RSV testing.  Fact Sheet for Patients: BloggerCourse.com  Fact Sheet for Healthcare Providers: SeriousBroker.it  This test is not yet approved or cleared by the [place] FDA and has been authorized for detection and/or diagnosis of SARS-CoV-2 by FDA under an Emergency Use Authorization (EUA). This EUA will remain in effect (meaning this  test can be used) for the duration of the COVID-19 declaration under Section 564(b)(1) of the Act, 21 U.S.C. section 360bbb-3(b)(1), unless the authorization is terminated or revoked.  Performed at [facility], [address] [place], [place] [address]   Urinalysis, Routine w reflex microscopic     Status: Abnormal   Collection Time: 06/28/21  3:50 PM  Result Value Ref Range   Color, Urine YELLOW YELLOW   APPearance CLEAR CLEAR   Specific Gravity, Urine 1.020 1.005 - 1.030   pH 5.0 5.0 - 8.0   Glucose, UA NEGATIVE NEGATIVE mg/dL   Hgb urine dipstick NEGATIVE NEGATIVE   Bilirubin Urine NEGATIVE NEGATIVE   Ketones, ur NEGATIVE NEGATIVE mg/dL   Protein, ur >=308 (A) NEGATIVE mg/dL   Nitrite NEGATIVE NEGATIVE   Leukocytes,Ua NEGATIVE NEGATIVE   RBC / HPF 0-5 0 - 5 RBC/hpf   WBC, UA 0-5 0 - 5 WBC/hpf   Bacteria, UA RARE (A) NONE SEEN   Squamous Epithelial / LPF 0-5 0 - 5   Mucus PRESENT    Ca Oxalate Crys, UA PRESENT     Comment: Performed at [facility], [address] [place], [place] [address]  Lactic acid, plasma     Status: None   Collection Time: 06/28/21  5:45 PM  Result Value Ref Range   Lactic Acid, Venous 1.4 0.5 - 1.9 mmol/L    Comment: Performed at [facility], [address], [place], [place] [address]  Troponin I (High Sensitivity)     Status: None   Collection Time: 06/28/21  5:45 PM  Result Value Ref Range   Troponin I (High Sensitivity) 8 <18 ng/L    Comment: (NOTE) Elevated high sensitivity troponin I (hsTnI) values and significant  changes across serial measurements may suggest ACS but many other  chronic and acute conditions are known to elevate hsTnI results.  Refer to the "Links" section for chest pain algorithms and additional  guidance. Performed at [facility], [address] [place], [place] [address]    CT Head Wo Contrast  Result Date: 06/28/2021 CLINICAL DATA:  Altered mental status. EXAM: CT HEAD WITHOUT CONTRAST TECHNIQUE: Contiguous axial  images were obtained from  the base of the skull through the vertex without intravenous contrast. RADIATION DOSE REDUCTION: This exam was performed according to the departmental dose-optimization program which includes automated exposure control, adjustment of the mA and/or kV according to patient size and/or use of iterative reconstruction technique. COMPARISON:  June 10, 2021. FINDINGS: Brain: Right posterior parietal encephalomalacia is noted consistent with old infarction. No mass effect or midline shift is noted. Ventricular size is within normal limits. There is no evidence of mass lesion, hemorrhage or acute infarction. Vascular: No hyperdense vessel or unexpected calcification. Skull: Normal. Negative for fracture or focal lesion. Sinuses/Orbits: No acute finding. Other: None. IMPRESSION: No acute intracranial abnormality seen. Electronically Signed   By: Lupita Raider M.D.   On: 06/28/2021 18:50   CT Abdomen Pelvis W Contrast  Result Date: 06/28/2021 CLINICAL DATA:  Fever and low oxygen saturation. EXAM: CT ABDOMEN AND PELVIS WITH CONTRAST TECHNIQUE: Multidetector CT imaging of the abdomen and pelvis was performed using the standard protocol following bolus administration of intravenous contrast. RADIATION DOSE REDUCTION: This exam was performed according to the departmental dose-optimization program which includes automated exposure control, adjustment of the mA and/or kV according to patient size and/or use of iterative reconstruction technique. CONTRAST:  OMNIPAQUE IOHEXOL 300 MG/ML  SOLN COMPARISON:  None. FINDINGS: Lower chest: Mild to moderate severity atelectasis is seen within the bilateral lung bases. Very small bilateral pleural effusions are noted. Hepatobiliary: No focal liver abnormality is seen. No gallstones, gallbladder wall thickening, or biliary dilatation. Pancreas: Unremarkable. No pancreatic ductal dilatation or surrounding inflammatory changes. Spleen: Normal in size without  focal abnormality. Adrenals/Urinary Tract: Adrenal glands are unremarkable. Kidneys are normal in size, without renal calculi or hydronephrosis. Bilateral subcentimeter simple renal cysts are seen. A stable 2.8 cm x 2.2 cm well-defined, exophytic area of mildly increased attenuation (approximately 56.69 Hounsfield units) is seen along the lateral aspect of the mid right kidney. Bladder is unremarkable. Stomach/Bowel: Stomach is within normal limits. Appendix appears normal. No evidence of bowel wall thickening, distention, or inflammatory changes. Noninflamed diverticula are seen throughout the descending and sigmoid colon Vascular/Lymphatic: Aortic atherosclerosis. No enlarged abdominal or pelvic lymph nodes. Reproductive: A 3.8 cm diameter fibroid is seen within the uterine fundus. A 5.4 cm x 3.2 cm well-defined area of mildly increased attenuation (approximately 55.11 Hounsfield units) is seen along the anterior aspect of the left adnexa. This measures approximately 3.9 cm x 4.0 cm on the prior study. Other: No abdominal wall hernia or abnormality. No abdominopelvic ascites. Musculoskeletal: A stable 4.0 cm x 2.6 cm fluid collection is seen along the distal left ileal psoas bursa. Bilateral total hip replacements are noted with associated streak artifact and subsequently limited evaluation of the adjacent osseous and soft tissue structures. IMPRESSION: 1. Mild to moderate severity bibasilar atelectasis with very small bilateral pleural effusions, decreased in severity when compared to the prior study. 2. Stable exophytic lesion within the right kidney which may represent a hemorrhagic or proteinaceous cyst. Correlation with renal ultrasound is recommended to exclude the presence of an underlying neoplastic process. 3. Well-defined area of mildly increased attenuation along the anterior aspect of the left adnexa which may represent an exophytic uterine fibroid. Ovarian origin cannot be excluded. Follow-up with  nonemergent pelvic ultrasound is recommended. 4. Colonic diverticulosis. 5. Bilateral total hip replacements 6. Aortic atherosclerosis. Aortic Atherosclerosis (ICD10-I70.0). Electronically Signed   By: Aram Candela M.D.   On: 06/28/2021 18:59   DG Chest Port 1 View  Result Date: 06/28/2021  CLINICAL DATA:  Code sepsis, fever. EXAM: PORTABLE CHEST 1 VIEW COMPARISON:  06/21/2021 and CT chest 05/21/2021. FINDINGS: Trachea is midline. Heart size stable. Right PICC tip is in the low SVC. Thoracic aorta is calcified. There may be minimal atelectasis in the medial aspects of both lung bases. No dense airspace consolidation. Biapical pleural thickening. No pleural fluid. IMPRESSION: Minimal bibasilar atelectasis. Electronically Signed   By: Leanna Battles M.D.   On: 06/28/2021 16:16    Review of Systems  Unable to perform ROS: Mental status change   Blood pressure 115/75, pulse (!) 140, temperature 98.3 F (36.8 C), temperature source Rectal, resp. rate (!) 21, height 5\' 7"  (1.702 m), weight 71.1 kg, SpO2 99 %. Physical Exam Constitutional:      General: She is not in acute distress.    Appearance: She is ill-appearing.  HENT:     Head: Atraumatic.     Mouth/Throat:     Mouth: Mucous membranes are dry.  Eyes:     Conjunctiva/sclera: Conjunctivae normal.  Cardiovascular:     Rate and Rhythm: Tachycardia present. Rhythm irregular.  Pulmonary:     Effort: Pulmonary effort is normal. No respiratory distress.     Breath sounds: Normal breath sounds.  Abdominal:     General: Abdomen is flat.     Palpations: Abdomen is soft.  Musculoskeletal:     Cervical back: No tenderness.  Lymphadenopathy:     Cervical: No cervical adenopathy.  Neurological:     General: No focal deficit present.     Comments: Rousable but not verbal and not following commands      Sunnie Nielsen, DO 06/28/2021, 8:51 PM

## 2021-06-28 NOTE — ED Notes (Signed)
Both sets of cultures drawn before antibiotic administration  

## 2021-06-28 NOTE — ED Notes (Signed)
Patient saturated in urine with yeast and pressure wound to sacral. Patient incontinent with chuck pad and brief replaced.  ?

## 2021-06-28 NOTE — Assessment & Plan Note (Signed)
Not in exacerbation, last Echo 05/21/2021 no concerns and EF WNL ?

## 2021-06-28 NOTE — Assessment & Plan Note (Signed)
Confers poor prognosis if not able to optimize diet  ?

## 2021-06-28 NOTE — Assessment & Plan Note (Addendum)
Most likely UTI, though current UA is unimpressive. Possible SIRS without infection but this seems less likely. Neg Flu/COVID and CT chest has reasonably eliminated pneumonia or other intrathoracic process, CT abdomen/pelvis has reasonably eliminated intraabdominal infection however abnormal findings of possible significance noted below  ?? Continue broad spectrum antibiotics, IV fluids  ?? Renal cyst/mass noted on CT, will obtain renal US, CT not c/w renal abscess though this may be possible given recent UTI though neoplastic process is more concern based on radiology report  ?? L adnexal attenuation on CT possible uterine fibroid vs ovarian process, recommended non emergent pelvic US, consider this if/when patient or family is able to provide consent for TVUS, but abscess seems unlikely  ?

## 2021-06-28 NOTE — ED Provider Notes (Signed)
?[facility] ?Provider Note ? ? ?CSN: [id number] ?Arrival date & time: 06/28/21  1526 ? ?  ? ?History ? ?Chief Complaint  ?Patient presents with  ? Weakness  ? ? ?[name] is a 76 y.o. female.  Level 5 caveat secondary to altered mental status.  Patient is brought in by EMS from her facility.  Patient is more lethargic for a few days.  No other information available.  Patient will open eyes to voice although does not participate in exam or history.  Has a history of A-fib and was recently admitted for sepsis.  Patient found to be tachycardic hypoxic and febrile.  Code sepsis activated. ? ?The history is provided by the EMS personnel.  ?Weakness ?Associated symptoms: lethargy   ?Altered Mental Status ?Presenting symptoms: lethargy   ?Most recent episode:  More than 2 days ago ?Episode history:  Continuous ?Timing:  Constant ?Progression:  Unchanged ?Chronicity:  Recurrent ?Context: nursing home resident and recent illness   ?Associated symptoms: weakness   ? ?  ? ?Home Medications ?Prior to Admission medications   ?Medication Sig Start Date End Date Taking? Authorizing Provider  ?acetaminophen (TYLENOL) 650 MG CR tablet Take 1,300 mg by mouth in the morning and at bedtime.    [provider]  ?albuterol (VENTOLIN HFA) 108 (90 Base) MCG/ACT inhaler Inhale 2 puffs into the lungs every 4 (four) hours as needed for wheezing or shortness of breath.    [provider]  ?Amino Acids-Protein Hydrolys (FEEDING SUPPLEMENT, PRO-STAT 64,) LIQD Take 30 mLs by mouth in the morning.    [provider]  ?Ascorbic Acid (VITAMIN C PO) Take 500 mg by mouth in the morning and at bedtime.    [provider]  ?atorvastatin (LIPITOR) 20 MG tablet Take 20 mg by mouth daily. 01/27/20   [provider]  ?azelastine (OPTIVAR) 0.05 % ophthalmic solution Place 1 drop into both eyes 2 (two) times daily.    [provider]  ?buPROPion ER (WELLBUTRIN SR) 100 MG 12 hr  tablet Take 1 tablet (100 mg total) by mouth 2 (two) times daily. 05/25/21   Barton Dubois, MD  ?Calcium Carbonate-Vit D-Min (CALCIUM 1200) 1200-1000 MG-UNIT CHEW 1 tablet with a meal    [provider]  ?carboxymethylcellulose (ARTIFICIAL TEARS) 1 % ophthalmic solution Place 2 drops into both eyes in the morning.    [provider]  ?cetirizine (ZYRTEC) 10 MG tablet 1 tablet    [provider]  ?cycloSPORINE (RESTASIS) 0.05 % ophthalmic emulsion Place 1 drop into both eyes 2 (two) times daily.    [provider]  ?diclofenac Sodium (VOLTAREN) 1 % GEL Apply 2 g topically at bedtime as needed for pain. 03/15/20   [provider]  ?diltiazem (CARDIZEM CD) 360 MG 24 hr capsule Take 1 capsule (360 mg total) by mouth daily. 06/21/21 06/21/22  Barton Dubois, MD  ?Eyelid Cleansers (OCUSOFT LID SCRUB ORIGINAL) PADS Apply 1 application topically in the morning and at bedtime. Apply to both eyelids for eye problem    [provider]  ?feeding supplement (ENSURE ENLIVE / ENSURE PLUS) LIQD Take 237 mLs by mouth 2 (two) times daily between meals. 06/21/21   Barton Dubois, MD  ?fluticasone-salmeterol (ADVAIR HFA) [phone] MCG/ACT inhaler Inhale 2 puffs into the lungs 2 times daily at 12 noon and 4 pm.    [provider]  ?furosemide (LASIX) 40 MG tablet Take 0.5 tablets (20 mg total) by mouth daily as needed for  fluid or edema. 05/25/21   Barton Dubois, MD  ?gabapentin (NEURONTIN) 100 MG capsule Take 3 capsules (300 mg total) by mouth 3 (three) times daily. ?Patient taking differently: Take 400 mg by mouth 2 (two) times daily. 10/10/17   Kathie Dike, MD  ?insulin glargine (LANTUS) 100 UNIT/ML injection Inject 30 Units into the skin in the morning. Hold if BS is less than 150    [provider]  ?insulin lispro (HUMALOG) 100 UNIT/ML injection Inject 6 Units into the skin in the morning and at bedtime. Hold is BS <100    [provider]  ?magnesium  oxide (MAG-OX) 400 MG tablet Take 400 mg by mouth daily.    [provider]  ?metFORMIN (GLUCOPHAGE-XR) 500 MG 24 hr tablet Take 1,000 mg by mouth 2 (two) times daily. 07/31/17   [provider]  ?metoprolol tartrate (LOPRESSOR) 100 MG tablet Take 1 tablet (100 mg total) by mouth 2 (two) times daily. 05/25/21   Barton Dubois, MD  ?Multiple Vitamin (MULTIVITAMIN) tablet Take 1 tablet by mouth daily.    [provider]  ?Multiple Vitamins-Minerals (PRESERVISION AREDS 2) CAPS Take 1 capsule by mouth daily.     [provider]  ?nitroGLYCERIN (NITROSTAT) 0.4 MG SL tablet Place 1 tablet (0.4 mg total) under the tongue every 5 (five) minutes as needed for chest pain (x 3 doses). 11/28/16   Lelon Perla, MD  ?omeprazole (PRILOSEC) 20 MG capsule Take 1 capsule by mouth daily. 02/12/18   [provider]  ?oxyCODONE (OXY IR/ROXICODONE) 5 MG immediate release tablet Take 1.5 tablets (7.5 mg total) by mouth every 8 (eight) hours as needed for severe pain. 06/21/21   Barton Dubois, MD  ?polyethylene glycol (MIRALAX / GLYCOLAX) 17 g packet Take 17 g by mouth 2 (two) times daily as needed for mild constipation.    [provider]  ?predniSONE (DELTASONE) 5 MG tablet Take 5 mg by mouth daily.    [provider]  ?rivaroxaban (XARELTO) 20 MG TABS tablet Take 1 tablet (20 mg total) by mouth daily at 6 PM. 06/21/21   Barton Dubois, MD  ?saccharomyces boulardii (FLORASTOR) 250 MG capsule Take 250 mg by mouth 2 (two) times daily.    [provider]  ?senna (SENOKOT) 8.6 MG TABS tablet Take 1 tablet by mouth daily.    [provider]  ?VASCEPA 1 g CAPS Take 1 capsule by mouth every morning. 02/25/18   [provider]  ?zinc sulfate 220 (50 Zn) MG capsule Take 220 mg by mouth daily.    [provider]  ?ferrous gluconate (FERGON) 324 MG tablet Take 1 tablet (324 mg total) by mouth 2 (two) times daily with a meal. ?Patient not taking:  Reported on 03/30/2020 05/17/16 04/01/20  Debbe Odea, MD  ?potassium chloride SA (K-DUR,KLOR-CON) 20 MEQ tablet Take 1 tablet (20 mEq total) by mouth 2 (two) times daily. ?Patient not taking: Reported on 03/30/2020 10/10/17 04/01/20  Kathie Dike, MD  ?   ? ?Allergies    ?Promethazine   ? ?Review of Systems   ?Review of Systems  ?Unable to perform ROS: Mental status change  ?Neurological:  Positive for weakness.  ? ?Physical Exam ?Updated Vital Signs ?BP (!) 170/81 (BP Location: Right Arm)   Pulse (!) 145   Temp (!) 103.8 ?F (39.9 ?C) (Rectal)   Resp (!) 24   Ht '5\' 7"'$  (1.702 m)   Wt 71.1 kg   SpO2 95%   BMI 24.55 kg/m?  ?  Physical Exam ?Vitals and nursing note reviewed.  ?Constitutional:   ?   General: She is not in acute distress. ?   Appearance: She is well-developed. She is ill-appearing.  ?HENT:  ?   Head: Normocephalic and atraumatic.  ?Eyes:  ?   Conjunctiva/sclera: Conjunctivae normal.  ?Cardiovascular:  ?   Rate and Rhythm: Tachycardia present. Rhythm irregular.  ?   Heart sounds: No murmur heard. ?Pulmonary:  ?   Effort: Pulmonary effort is normal. No respiratory distress.  ?   Breath sounds: Normal breath sounds.  ?Abdominal:  ?   Palpations: Abdomen is soft.  ?   Tenderness: There is no abdominal tenderness. There is no guarding or rebound.  ?Musculoskeletal:     ?   General: No swelling or tenderness.  ?   Cervical back: Neck supple.  ?Skin: ?   General: Skin is warm and dry.  ?   Capillary Refill: Capillary refill takes less than 2 seconds.  ?   Comments: Patient's lower extremities were unwrapped.  She does not have any significant erythema.  There is a few superficial ulcerations on her toes of her left foot.  No cords appreciated  ?Neurological:  ?   Comments: Patient will open eyes to voice.  She is minimally conversant and would not participate in exam.  ? ? ?ED Results / Procedures / Treatments   ?Labs ?(all labs ordered are listed, but only abnormal results are displayed) ?Labs Reviewed   ?COMPREHENSIVE METABOLIC PANEL - Abnormal; Notable for the following components:  ?    Result Value  ? Glucose, Bld 140 (*)   ? Albumin 2.7 (*)   ? Alkaline Phosphatase 172 (*)   ? All other components within normal l

## 2021-06-28 NOTE — Assessment & Plan Note (Signed)
?   Maintain SSI ?

## 2021-06-28 NOTE — Assessment & Plan Note (Addendum)
?   Maintain cardizem gtt and telemetry ?? Maintain Xarelto   ?

## 2021-06-28 NOTE — Assessment & Plan Note (Signed)
Minimal erythema, no obvious cellulitis ?? Wound care consulted ?? Continue broad spectrum abx  ?

## 2021-06-28 NOTE — Assessment & Plan Note (Signed)
No s/s bleeding, pt is on chronic anticoagulation ?? monitro routine CBC ?

## 2021-06-28 NOTE — Assessment & Plan Note (Signed)
?   Continue home prednisone  ?

## 2021-06-28 NOTE — Assessment & Plan Note (Signed)
Continue PPI ?

## 2021-06-28 NOTE — ED Notes (Signed)
Pt has a rectal temperature of 103.8 F, MD made aware ?

## 2021-06-28 NOTE — ED Notes (Signed)
Gave update to [place] at [facility]. Information was passed on to ICU RN ?

## 2021-06-29 ENCOUNTER — Inpatient Hospital Stay (HOSPITAL_COMMUNITY): Payer: 59

## 2021-06-29 ENCOUNTER — Encounter (HOSPITAL_COMMUNITY): Payer: 59

## 2021-06-29 ENCOUNTER — Encounter (HOSPITAL_COMMUNITY): Payer: Self-pay | Admitting: Osteopathic Medicine

## 2021-06-29 DIAGNOSIS — Z515 Encounter for palliative care: Secondary | ICD-10-CM

## 2021-06-29 DIAGNOSIS — A419 Sepsis, unspecified organism: Secondary | ICD-10-CM

## 2021-06-29 DIAGNOSIS — Z7189 Other specified counseling: Secondary | ICD-10-CM

## 2021-06-29 DIAGNOSIS — I4891 Unspecified atrial fibrillation: Secondary | ICD-10-CM

## 2021-06-29 LAB — COMPREHENSIVE METABOLIC PANEL
ALT: 15 U/L (ref 0–44)
AST: 19 U/L (ref 15–41)
Albumin: 2.2 g/dL — ABNORMAL LOW (ref 3.5–5.0)
Alkaline Phosphatase: 134 U/L — ABNORMAL HIGH (ref 38–126)
Anion gap: 9 (ref 5–15)
BUN: 17 mg/dL (ref 8–23)
CO2: 23 mmol/L (ref 22–32)
Calcium: 8.3 mg/dL — ABNORMAL LOW (ref 8.9–10.3)
Chloride: 110 mmol/L (ref 98–111)
Creatinine, Ser: 0.64 mg/dL (ref 0.44–1.00)
GFR, Estimated: >60 mL/min (ref >60)
Glucose, Bld: 114 mg/dL — ABNORMAL HIGH (ref 70–99)
Potassium: 3.4 mmol/L — ABNORMAL LOW (ref 3.5–5.1)
Sodium: 142 mmol/L (ref 135–145)
Total Bilirubin: 0.7 mg/dL (ref 0.3–1.2)
Total Protein: 5.8 g/dL — ABNORMAL LOW (ref 6.5–8.1)

## 2021-06-29 LAB — CBC
HCT: 34.2 % — ABNORMAL LOW (ref 36.0–46.0)
Hemoglobin: 9.8 g/dL — ABNORMAL LOW (ref 12.0–15.0)
MCH: 25.7 pg — ABNORMAL LOW (ref 26.0–34.0)
MCHC: 28.7 g/dL — ABNORMAL LOW (ref 30.0–36.0)
MCV: 89.8 fL (ref 80.0–100.0)
Platelets: 419 10*3/uL — ABNORMAL HIGH (ref 150–400)
RBC: 3.81 MIL/uL — ABNORMAL LOW (ref 3.87–5.11)
RDW: 19.9 % — ABNORMAL HIGH (ref 11.5–15.5)
WBC: 10.9 10*3/uL — ABNORMAL HIGH (ref 4.0–10.5)
nRBC: 0 % (ref 0.0–0.2)

## 2021-06-29 LAB — PROTIME-INR
INR: 1.2 (ref 0.8–1.2)
Prothrombin Time: 15.5 seconds — ABNORMAL HIGH (ref 11.4–15.2)

## 2021-06-29 LAB — GLUCOSE, CAPILLARY
Glucose-Capillary: 92 mg/dL (ref 70–99)
Glucose-Capillary: 114 mg/dL — ABNORMAL HIGH (ref 70–99)
Glucose-Capillary: 166 mg/dL — ABNORMAL HIGH (ref 70–99)
Glucose-Capillary: 134 mg/dL — ABNORMAL HIGH (ref 70–99)

## 2021-06-29 LAB — PROCALCITONIN: Procalcitonin: 0.51 ng/mL

## 2021-06-29 LAB — CORTISOL-AM, BLOOD: Cortisol - AM: 7.4 ug/dL (ref 6.7–22.6)

## 2021-06-29 MED ORDER — HYDROCODONE-ACETAMINOPHEN 5-325 MG PO TABS
1.0000 | ORAL_TABLET | Freq: Four times a day (QID) | ORAL | Status: DC | PRN
  Administered 2021-06-29: 1 via ORAL
  Filled 2021-06-29: qty 1

## 2021-06-29 MED ORDER — DILTIAZEM HCL 60 MG PO TABS
120.0000 mg | ORAL_TABLET | Freq: Four times a day (QID) | ORAL | Status: DC
  Administered 2021-06-29 – 2021-06-30 (×4): 120 mg via ORAL
  Filled 2021-06-29 (×4): qty 2

## 2021-06-29 MED ORDER — INSULIN ASPART 100 UNIT/ML IJ SOLN
0.0000 [IU] | Freq: Three times a day (TID) | INTRAMUSCULAR | Status: DC
  Administered 2021-06-29: 2 [IU] via SUBCUTANEOUS

## 2021-06-29 MED ORDER — OCUVITE-LUTEIN PO CAPS
1.0000 | ORAL_CAPSULE | Freq: Every day | ORAL | Status: DC
  Administered 2021-06-29: 1 via ORAL
  Filled 2021-06-29 (×2): qty 1

## 2021-06-29 MED ORDER — INSULIN ASPART 100 UNIT/ML IJ SOLN: 0.0000 [IU] | Freq: Every day | INTRAMUSCULAR | Status: DC

## 2021-06-29 MED ORDER — KETOROLAC TROMETHAMINE 15 MG/ML IJ SOLN
15.0000 mg | Freq: Four times a day (QID) | INTRAMUSCULAR | Status: DC | PRN
  Administered 2021-06-29 – 2021-06-30 (×3): 15 mg via INTRAVENOUS
  Filled 2021-06-29 (×3): qty 1

## 2021-06-29 MED ORDER — CHLORHEXIDINE GLUCONATE CLOTH 2 % EX PADS
6.0000 | MEDICATED_PAD | Freq: Every day | CUTANEOUS | Status: DC
  Administered 2021-06-29: 6 via TOPICAL

## 2021-06-29 MED ORDER — POTASSIUM CHLORIDE CRYS ER 20 MEQ PO TBCR: 40.0000 meq | EXTENDED_RELEASE_TABLET | Freq: Two times a day (BID) | ORAL | Status: DC

## 2021-06-29 MED ORDER — POTASSIUM CHLORIDE 20 MEQ PO PACK
40.0000 meq | PACK | Freq: Two times a day (BID) | ORAL | Status: AC
  Administered 2021-06-29 (×2): 40 meq via ORAL
  Filled 2021-06-29 (×2): qty 2

## 2021-06-29 NOTE — Consult Note (Signed)
WOC Nurse Consult Note: ?Consult completed remotely with assistance of primary RN, Karlene Einstein, and use of room ELink camera. ?Reason for Consult: pressure ulcer care ?Wound type: Patient has diabetic foot ulcers to left toes 3 & 4, left plantar first metatarsal head, and right 4th toe tip.  All are covered with scabs and without overt s/s of infection. ?She also has abrasions on bilateral shins and bilateral knees, all also covered with scabs. ?There is a DTPI to the coccyx. ?Pressure Injury POA: Yes ?Measurement: coccyx wound measures 1 cm x 0.4 cm ?Wound bed: purple ?Drainage (amount, consistency, odor) none ?Periwound: intact ?Dressing procedure/placement/frequency: ?Foam dressing to coccyx, change every 3 days and prn. ?Iodine to all abrasions and BLE and foot scabs. ?Offload pressure from the coccyx by turning patient. ? ?Monitor the wound area(s) for worsening of condition such as: ?Signs/symptoms of infection,  ?Increase in size,  ?Development of or worsening of odor, ?Development of pain, or increased pain at the affected locations.  Notify the medical team if any of these develop. ? ?Thank you for the consult.  Discussed plan of care with the patient and bedside nurse.  [facility] nurse will not follow at this time.  Please re-consult the [facility] team if needed. ? ?Val Riles, RN, MSN, CWOCN, CNS-BC, pager [phone]   ?

## 2021-06-29 NOTE — Hospital Course (Signed)
Per HPI: ?Patient brought to ED 06/28/2021 via EMS from SNF with reports of increased lethargy for "a few days."  No other information available.  Per ED provider notes, patient will open eyes to voice although does not participate in exam or history.   ?In ED, patient tachypneic, hypoxic, febrile.  Code sepsis activated.  Patient was given multiple fluid boluses with lactated Ringer's, given Tylenol, started on Cardizem drip, started on cefepime and meropenem broad-spectrum antibiotics.  Recent admission for UTI, urinalysis ED no definitive UTI, WBC 10.9, hemoglobin recently 9.5, 11.6 now, chest x-ray showing minimal bibasilar atelectasis, CT chest, abdomen, pelvis no obvious intrathoracic or intra-abdominal infectious process, CT head no acute intracranial process. ? ?3/28: Patient was admitted for possible sepsis and has been started on vancomycin and Merrem empirically.  She is also noted to have atrial fibrillation with RVR for which she has been started on IV Cardizem drip.  Renal ultrasound pending for further evaluation of cyst/mass noted on CT. ?

## 2021-06-29 NOTE — Progress Notes (Signed)
Patient's wounds evaluated with WOCN utilzing E-Link camera.  ?Multiple diabetic foot ulcers noted on bilateral feet.  ?Abrasions noted on bilateral shins and bilateral knees, all are scabbed over. Flowsheet documentation updated to completed healing/scabbed over wounds that will be covered with betadine/iodine.  ?Updated flowsheet documentation to reflect only one coccyx wound noted, DTI. Mepilex foam dressing applied.  ?

## 2021-06-29 NOTE — Evaluation (Signed)
Clinical/Bedside Swallow Evaluation ?Patient Details  ?Name: [name] ?MRN: [id number] ?Date of Birth: [birth year] ? ?Today's Date: 06/29/2021 ?Time: SLP Start Time (ACUTE ONLY): 1414 SLP Stop Time (ACUTE ONLY): 7494 ?SLP Time Calculation (min) (ACUTE ONLY): 20 min ? ?Past Medical History:  ?Past Medical History:  ?Diagnosis Date  ? Abnormal LFTs 07/24/2017  ? Actinic keratosis   ? Acute CVA (cerebrovascular accident) ([place])   ? Acute deep vein thrombosis (DVT) of left lower extremity ([place]) 05/06/2014  ? Anemia   ? hx of  ? Anemia of chronic renal failure, stage 3 (moderate) ([place]) 07/17/2015  ? Anticoagulant causing adverse effect in therapeutic use 07/17/2015  ? Anxiety   ? Arthritis   ? rheumatoid  ? Atrial fibrillation ([place])   ? a. persistent, she remains on Xarelto  ? Carpal tunnel syndrome   ? Chronic diastolic (congestive) heart failure (H[facility])   ? a. 04/2016: echo showing a preserved EF of 55-60% with mild MR. LA and RA midly dilated.   ? Edema   ? left leg at ankle resolved now  ? Gastroesophageal reflux disease   ? Hepatitis 1970  ? not sure what kind  ? Hiatal hernia   ? Hyperlipidemia   ? Hypertension   ? Iron deficiency anemia due to chronic blood loss 07/17/2015  ? Jaundice   ? age 79  ? Peripheral vascular disease ([place]) yrs ago  ? DVT left lower leg questionale told by 2 drs i had no clot, 1 md said i did  ? Rheumatoid arthritis(714.0)   ? Right knee pain   ? Stroke [facility])   ? Type II diabetes mellitus ([place])   ? type2  ? ?Past Surgical History:  ?Past Surgical History:  ?Procedure Laterality Date  ? CATARACT EXTRACTION Bilateral   ? COLONOSCOPY WITH PROPOFOL N/A 02/20/2015  ? Procedure: COLONOSCOPY WITH PROPOFOL;  Surgeon: Christina Ada, MD;  Location: [facility] ENDOSCOPY;  Service: Endoscopy;  Laterality: N/A;  ? ENTEROSCOPY N/A 03/23/2016  ? Procedure: ENTEROSCOPY;  Surgeon: Christina Ada, MD;  Location: [facility];  Service: Endoscopy;  Laterality: N/A;  ? ENTEROSCOPY N/A 06/17/2016  ? Procedure: ENTEROSCOPY;   Surgeon: Christina Ada, MD;  Location: [facility];  Service: Endoscopy;  Laterality: N/A;  ? ESOPHAGOGASTRODUODENOSCOPY (EGD) WITH PROPOFOL N/A 02/20/2015  ? Procedure: ESOPHAGOGASTRODUODENOSCOPY (EGD) WITH PROPOFOL;  Surgeon: Christina Ada, MD;  Location: [facility];  Service: Endoscopy;  Laterality: N/A;  ? GIVENS CAPSULE STUDY N/A 03/21/2016  ? Procedure: GIVENS CAPSULE STUDY;  Surgeon: Christina Ada, MD;  Location: [facility];  Service: Endoscopy;  Laterality: N/A;  ? HAND SURGERY  1995 and 1996  ? artificial joints both hands  ? JOINT REPLACEMENT    ? LUMBAR LAMINECTOMY/DECOMPRESSION MICRODISCECTOMY N/A 02/10/2016  ? Procedure: MICROLUMBAR DECOMPRESSION L4-L5 AND L3- L4, AND EXCISION OF SYNOVIAL CYST L4-L5;  Surgeon: Christina Day, MD;  Location: [facility] ORS;  Service: Orthopedics;  Laterality: N/A;  ? TOTAL HIP ARTHROPLASTY Left 2009  ? TOTAL HIP ARTHROPLASTY Right 04/03/2013  ? Procedure: RIGHT TOTAL HIP ARTHROPLASTY ANTERIOR APPROACH;  Surgeon: Christina Alf, MD;  Location: [facility];  Service: Orthopedics;  Laterality: Right;  ? ?HPI:  ?[name] is a 76 yo female from [place] who was brought to ED 06/28/2021 via EMS from SNF with reports of increased lethargy for "a few days." In ED, patient tachypneic, hypoxic, febrile.  Code sepsis activated.  Patient was given multiple fluid boluses with lactated Ringer's, given Tylenol, started on Cardizem drip, started on cefepime  and meropenem broad-spectrum antibiotics.  Recent admission for UTI, urinalysis ED no definitive UTI, WBC 10.9, hemoglobin recently 9.5, 11.6 now, chest x-ray showing minimal bibasilar atelectasis, CT chest, abdomen, pelvis no obvious intrathoracic or intra-abdominal infectious process, CT head no acute intracranial process. BSE requested.  ?  ?Assessment / Plan / Recommendation  ?Clinical Impression ? Clinical swallow evaluation completed at bedside. Pt reports that she only has difficulty swallowing when her "mouth is dry". Oral motor  examination is WNL. Pt elevated as high as she was willing due to back pain. Pt without overt signs or symptoms of aspiration with consistencies and textures presented. Pt able to self feed after SLP set up assist (Pt with RA). Pt with mildly prolonged oral transit with solids, but was able to clear and benefited from liquid wash. Pt will need her meats cut up for her, but otherwise ok for regular textures and thin liquids, straws ok, and po medications whole with water. Pt indicates that her intake in the hospital is poor due to distaste. No further SLP services indicated at this time. SLP will sign off.  ?SLP Visit Diagnosis: Dysphagia, unspecified (R13.10) ?   ?Aspiration Risk ? No limitations  ?  ?Diet Recommendation Regular;Thin liquid  ? ?Liquid Administration via: Cup;Straw ?Medication Administration: Whole meds with liquid ?Supervision: Patient able to self feed;Intermittent supervision to cue for compensatory strategies ?Compensations: Small sips/bites ?Postural Changes: Seated upright at 90 degrees;Remain upright for at least 30 minutes after po intake  ?  ?Other  Recommendations Oral Care Recommendations: Oral care BID;Staff/trained caregiver to provide oral care ?Other Recommendations: Clarify dietary restrictions   ? ?Recommendations for follow up therapy are one component of a multi-disciplinary discharge planning process, led by the attending physician.  Recommendations may be updated based on patient status, additional functional criteria and insurance authorization. ? ?Follow up Recommendations No SLP follow up  ? ? ?  ?Assistance Recommended at Discharge Frequent or constant Supervision/Assistance  ?Functional Status Assessment Patient has not had a recent decline in their functional status  ?Frequency and Duration    ?  ?  ?   ? ?Prognosis Prognosis for Safe Diet Advancement: Good  ? ?  ? ?Swallow Study   ?General Date of Onset: 06/28/21 ?HPI: [name] is a 76 yo female from [place] who  was brought to ED 06/28/2021 via EMS from SNF with reports of increased lethargy for "a few days." In ED, patient tachypneic, hypoxic, febrile.  Code sepsis activated.  Patient was given multiple fluid boluses with lactated Ringer's, given Tylenol, started on Cardizem drip, started on cefepime and meropenem broad-spectrum antibiotics.  Recent admission for UTI, urinalysis ED no definitive UTI, WBC 10.9, hemoglobin recently 9.5, 11.6 now, chest x-ray showing minimal bibasilar atelectasis, CT chest, abdomen, pelvis no obvious intrathoracic or intra-abdominal infectious process, CT head no acute intracranial process. BSE requested. ?Type of Study: Bedside Swallow Evaluation ?Previous Swallow Assessment: 07/2017 D3/thin ?Diet Prior to this Study: Regular;Thin liquids ?Temperature Spikes Noted: No ?Respiratory Status: Room air ?History of Recent Intubation: No ?Behavior/Cognition: Alert;Cooperative;Pleasant mood ?Oral Cavity Assessment: Within Functional Limits ?Oral Care Completed by SLP: Recent completion by staff ?Oral Cavity - Dentition: Adequate natural dentition ?Vision: Functional for self-feeding ?Self-Feeding Abilities: Able to feed self;Needs set up (Pt has RA in hands) ?Patient Positioning: Upright in bed ?Baseline Vocal Quality: Normal ?Volitional Cough: Strong ?Volitional Swallow: Able to elicit  ?  ?Oral/Motor/Sensory Function Overall Oral Motor/Sensory Function: Within functional limits   ?Ice Chips Ice chips:  Within functional limits ?Presentation: Spoon   ?Thin Liquid Thin Liquid: Within functional limits ?Presentation: Cup;Self Fed;Straw  ?  ?Nectar Thick Nectar Thick Liquid: Not tested   ?Honey Thick Honey Thick Liquid: Not tested   ?Puree Puree: Within functional limits ?Presentation: Spoon;Self Fed   ?Solid ? ? ?  Solid: Within functional limits ?Presentation: Self Fed ?Other Comments: mildly prolonged oral prep  ? ?  ?Thank you, ? ?Genene Churn, [place] ?[phone] ? ?Jerrine Urschel ?06/29/2021,3:42  PM ? ? ? ? ?

## 2021-06-29 NOTE — Progress Notes (Addendum)
Initial Nutrition Assessment ? ?DOCUMENTATION CODES:  ? ?Severe malnutrition in context of acute illness/injury ? ?INTERVENTION:  ?ProSource 30 ml daily  ? ?Ensure Enlive po BID, each supplement provides 350 kcal and 20 grams of protein.  ? ?Assist with feeding all meals  ? ?Liberalize diet to Regular given minimal intake  ? ?NUTRITION DIAGNOSIS:  ? ?Severe Malnutrition related to acute illness as evidenced by percent weight loss (21%), energy intake < 75% for > or equal to 1 month, severe muscle loss. ? ?-ongoing with additional weight loss x 10 days  ? ?GOAL:  ?Patient will meet greater than or equal to 90% of their needs (if feasible given patient condition) ? ?MONITOR:  ?PO intake, Supplement acceptance, Labs, Skin, Weight trends ? ?REASON FOR ASSESSMENT: ? ?Malnutrition Screening Tool ?  ? ?ASSESSMENT: ?Patient hospitalized 2-16>2-20 sepsis due to left iliopsoas abscess, atrial fib with RVR and hypoxemic respiratory failure ? ?Patient presented on 3/9 from [facility] after a fall. Hx of HF, RA, DM2, CVA, HTN, PVD. Status post debridement of wound on 3/8- lower extremities.  Patient treated with Merrem for UTI (proteus bacteremia)-.  ? ?3/15- Wound nurse assessment- reviewed.  ? ?3/17 Nutrition intake highly variable between 0-75% of meals since admission. Patient breakfast tray is untouched. She has been agitated and unable to take nutrition orally at times. LOS day 8- patient triggered today for RD visit due to malnutrition screen. High risk for nutrient deficiency due to increased nutrient needs and minimal intake over the past 8 days.  ? ?3/20 Patient discharged to [facility].  ? ?3/27 Patient returned to hospital from SNF with fever low O2 sats, tachycardia and altered mental status. Sepsis -cultures pending.  ? ?Patient unable to recall what she has been eating since discharge. Lunch tray is here and < 25% of meal completed. Pressure injury present on admission. Severe malnutrition ongoing.  Insufficient oral intake and significant weight loss-(17.6 kg) 21% since 05/05/21.  ? ?Palliative team consulted to discuss [place]. Patient plans to discharge back to LTC facility. Question if she is a candidate for enteral feeding- PEG tube placement?  ? ?Medications: insulin, Vitamin C, calcium carbonate  ? ? ?Labs: ? ?  Latest Ref Rng & Units 06/29/2021  ?  3:44 AM 06/28/2021  ?  3:25 PM 06/21/2021  ?  5:50 AM  ?BMP  ?Glucose 70 - 99 mg/dL 114   140   117    ?BUN 8 - 23 mg/dL '17   21   16    '$ ?Creatinine 0.44 - 1.00 mg/dL 0.64   0.70   0.66    ?Sodium 135 - 145 mmol/L 142   144   139    ?Potassium 3.5 - 5.1 mmol/L 3.4   3.5   3.7    ?Chloride 98 - 111 mmol/L 110   111   107    ?CO2 22 - 32 mmol/L '23   23   22    '$ ?Calcium 8.9 - 10.3 mg/dL 8.3   8.9   8.3    ?   ? ?NUTRITION - FOCUSED PHYSICAL EXAM: ? ?Flowsheet Row Most Recent Value  ?Orbital Region Moderate depletion  ?Upper Arm Region Mild depletion  ?Thoracic and Lumbar Region Mild depletion  ?Temple Region Moderate depletion  ?Clavicle Bone Region Moderate depletion  ?Clavicle and Acromion Bone Region Severe depletion  ?Scapular Bone Region Moderate depletion  ?Dorsal Hand Severe depletion  ?Patellar Region Severe depletion  ?Anterior Thigh Region Severe depletion  ?Posterior  Calf Region Severe depletion  ?Edema (RD Assessment) Mild  ?Hair Reviewed  ?Eyes Unable to assess  ?Mouth Unable to assess  ?Skin Reviewed  ?Nails Reviewed  ? ?  ? ? ?Diet Order:   ?Diet Order   ? ?       ?  Diet Heart Room service appropriate? Yes; Fluid consistency: Thin  Diet effective now       ?  ? ?  ?  ? ?  ? ? ?EDUCATION NEEDS:  ?Not appropriate for education at this time ? ?Skin:  Skin Assessment: Reviewed RN Assessment ? ?Last BM:  3/28 type 5 ? ?Height:  ? ?Ht Readings from Last 1 Encounters:  ?06/28/21 '5\' 7"'$  (1.702 m)  ? ? ?Weight:  ? ?Wt Readings from Last 1 Encounters:  ?06/29/21 66.3 kg  ? ? ?Ideal Body Weight:   61 kg ? ?BMI:  Body mass index is 22.89 kg/m?. ? ?Estimated  Nutritional Needs:  ? ?Kcal:  1700-1800 ? ?Protein:  88-95 gr ? ?Fluid:  >1600 ml daily ? ? ?Colman Cater MS,RD,CSG,LDN ?Contact: AMION ?

## 2021-06-29 NOTE — Consult Note (Signed)
? ?                                                                                ?Consultation Note ?Date: 06/29/2021  ? ?Patient Name: [name]  ?DOB: 07/11/1945  MRN: [id number]  Age / Sex: 76 y.o., female  ?PCP: Aletha Halim., PA-C ?Referring Physician: Heath Lark D, DO ? ?Reason for Consultation: Establishing goals of care ? ?HPI/Patient Profile: 76 y.o. female  with past medical history of paroxysmal A-fib on Xarelto, chronic diastolic heart failure, HTN/HLD, DM2, prior CVA, PVD, RA, chronic lower extremity wounds admitted on 06/28/2021 with sepsis due to undetermined organism, monitoring blood cultures, chronic A-fib with RVR.  ? ?Clinical Assessment and Goals of Care: ?I have reviewed medical records including EPIC notes, labs and imaging, received report from RN, assessed the patient.  [name] is sitting up in bed.  She greets me, making and somewhat keeping eye contact.  She appears acutely/chronically ill and quite frail.  Although she is oriented to person and situation, she is experiencing periods of confusion.  [name] tells me that she came because she had an aneurysm on her legs that needed to be drained.  I do believe that she can make her basic needs known.  There is no family at bedside at this time.   ? ?We talk about her acute health concerns including, but not limited to, sepsis/infection increased heart rate and treatment plan, and disposition.  [name] tells me that she would like to return to [facility].  She said she also shares that, at this point, she would want to be rehospitalized as needed. ? ?[name] was seen by this NP December 2021.  She had infection at that time and was unresponsive, but had a relatively quick turnaround and was discharged to her long-term care facility.  She has been hospitalized 3 times in the last 6 weeks. ? ?Call to son/healthcare agent, Fara Olden, to discuss diagnosis prognosis, GOC, EOL wishes, disposition and options.  I introduced  Palliative Medicine as specialized medical care for people living with serious illness. It focuses on providing relief from the symptoms and stress of a serious illness. The goal is to improve quality of life for both the patient and the family. ? ?We focused on their current illness.  We talked about [name] acute illness and the treatment plan.  We talk about rehospitalization per [name] wishes, returning to [facility] under long-term care, and CODE STATUS.  At this point, Fara Olden is agreeable to continue to treat as long as that is what his mother wishes.  We talked about a time in the future where she may not be able to have a turnaround which he states at understanding and agreement.  The natural disease trajectory and expectations at EOL were discussed. ? ?Advanced directives, concepts specific to code status, were considered and discussed.  [name] continues as DNR.  She would want to be rehospitalized as needed.  Son/healthcare agent, Fara Olden, will respect this wish. ? ?Hospice and Palliative Care services outpatient were explained and offered.  I am not sure of the benefit for outpatient palliative services for [name].  At this point her goals are set for returning to [facility] under long-term care, continuing to treat the treatable but no CPR or intubation, rehospitalize as needed.  Fara Olden and I talk about a time in the future where [name] is not able to have a turnaround, and qualifying for hospice care. ? ?Discussed the importance of continued conversation with family and the medical providers regarding overall plan of care and treatment options, ensuring decisions are within the context of the patient?s values and GOCs.  Questions and concerns were addressed.   The family was encouraged to call with questions or concerns.  PMT will continue to support holistically. ? ?Conference with attending, bedside nursing staff, and TOC team related to patient condition, needs, goals of care,  disposition. ? ?HCPOA   ?HCPOA - son, [name].  ?  ? ?SUMMARY OF RECOMMENDATIONS   ?At this point continue to treat the treatable but no CPR or intubation ?Return to [facility] under long-term care ?Rehospitalize as needed ?No real benefit for outpatient palliative services as goals are set. ? ? ?Code Status/Advance Care Planning: ?DNR ? ?Symptom Management:  ?Per hospitalist, no additional needs at this time. ? ?Palliative Prophylaxis:  ?Frequent Pain Assessment and Oral Care ? ?Additional Recommendations (Limitations, Scope, Preferences): ?Treat the treatable but no CPR or intubation ? ?Psycho-social/Spiritual:  ?Desire for further Chaplaincy support:no ?Additional Recommendations: Caregiving  Support/Resources ? ?Prognosis:  ?Unable to determine, based on outcomes.  6 months or less would not be surprising based on 3 hospital stays in the last 6 weeks, resident of nursing home for over 3 years, decreasing functional status. ? ?Discharge Planning:  Anticipate return to long-term care at [facility], would benefit from outpatient palliative/hospice care.   ? ?  ? ?Primary Diagnoses: ?Present on Admission: ? Sepsis due to undetermined organism [facility]) ? Anemia of chronic renal failure, stage 3 (moderate) (HCC) ? Hypertension ? (Resolved) Hypercholesterolemia ? Type 2 diabetes mellitus with hyperlipidemia ([place]) ? Rheumatoid arthritis ([place]) ? Gastroesophageal reflux disease ? Chronic diastolic (congestive) heart failure (HCC) ? (Resolved) Atrial fibrillation with RVR ([place]) ? CVA (cerebral vascular accident) [facility]) ? Pressure injury of skin ? Chronic atrial fibrillation with RVR (HCC) ? Acute metabolic encephalopathy ? Protein-calorie malnutrition, severe ? ? ?I have reviewed the medical record, interviewed the patient and family, and examined the patient. The following aspects are pertinent. ? ?Past Medical History:  ?Diagnosis Date  ? Abnormal LFTs 07/24/2017  ? Actinic keratosis   ? Acute CVA (cerebrovascular  accident) ([place])   ? Acute deep vein thrombosis (DVT) of left lower extremity ([place]) 05/06/2014  ? Anemia   ? hx of  ? Anemia of chronic renal failure, stage 3 (moderate) ([place]) 07/17/2015  ? Anticoagulant causing adverse effect in therapeutic use 07/17/2015  ? Anxiety   ? Arthritis   ? rheumatoid  ? Atrial fibrillation ([place])   ? a. persistent, she remains on Xarelto  ? Carpal tunnel syndrome   ? Chronic diastolic (congestive) heart failure (HCC)   ? a. 04/2016: echo showing a preserved EF of 55-60% with mild MR. LA and RA midly dilated.   ? Edema   ? left leg at ankle resolved now  ? Gastroesophageal reflux disease   ? Hepatitis 1970  ? not sure what kind  ? Hiatal hernia   ? Hyperlipidemia   ? Hypertension   ? Iron deficiency anemia due to chronic blood loss 07/17/2015  ? Jaundice   ? age 19  ? Peripheral  vascular disease ([place]) yrs ago  ? DVT left lower leg questionale told by 2 drs i had no clot, 1 md said i did  ? Rheumatoid arthritis(714.0)   ? Right knee pain   ? Stroke [facility])   ? Type II diabetes mellitus ([place])   ? type2  ? ?Social History  ? ?Socioeconomic History  ? Marital status: Divorced  ?  Spouse name: Not on file  ? Number of children: Not on file  ? Years of education: Not on file  ? Highest education level: Not on file  ?Occupational History  ? Occupation: Retired  ?Tobacco Use  ? Smoking status: Never  ? Smokeless tobacco: Never  ?Vaping Use  ? Vaping Use: Never used  ?Substance and Sexual Activity  ? Alcohol use: No  ?  Alcohol/week: 0.0 standard drinks  ? Drug use: No  ? Sexual activity: Not Currently  ?Other Topics Concern  ? Not on file  ?Social History Narrative  ? Not on file  ? ?Social Determinants of Health  ? ?Financial Resource Strain: Not on file  ?Food Insecurity: Not on file  ?Transportation Needs: Not on file  ?Physical Activity: Not on file  ?Stress: Not on file  ?Social Connections: Not on file  ? ?Family History  ?Problem Relation Age of Onset  ? Pneumonia Mother 30  ? Stroke Father 57  ?  Heart failure Sister   ? Hypertension Sister   ? Heart attack Neg Hx   ? ?Scheduled Meds: ? (feeding supplement) PROSource Plus  30 mL Oral Daily  ? vitamin C  500 mg Oral Daily  ? atorvastatin  20 mg Oral

## 2021-06-29 NOTE — TOC Initial Note (Signed)
Transition of Care (TOC) - Initial/Assessment Note  ? ? ?Patient Details  ?Name: [name] ?MRN: [id number] ?Date of Birth: 07/07/1945 ? ?Transition of Care (TOC) CM/SW Contact:    ?Iona Beard, LCSWA ?Phone Number: ?06/29/2021, 11:04 AM ? ?Clinical Narrative:                 ?Pt is high risk for readmission. Pt is a resident at [facility] facility. Plan will be for return to the facility at D/C. CSW started Fl2. TOC to follow.  ? ?Expected Discharge Plan: [place] ?Barriers to Discharge: Continued Medical Work up ? ? ?Patient Goals and CMS Choice ?Patient states their goals for this hospitalization and ongoing recovery are:: return to facility ?  ?  ? ?Expected Discharge Plan and Services ?Expected Discharge Plan: [place] ?In-house Referral: Clinical Social Work ?  ?Post Acute Care Choice: [place] ?Living arrangements for the past 2 months: [place] ?                ?  ?  ?  ?  ?  ?  ?  ?  ?  ?  ? ?Prior Living Arrangements/Services ?Living arrangements for the past 2 months: [place] ?Lives with:: Facility Resident ?Patient language and need for interpreter reviewed:: Yes ?Do you feel safe going back to the place where you live?: Yes      ?Need for Family Participation in Patient Care: Yes (Comment) ?Care giver support system in place?: Yes (comment) ?  ?Criminal Activity/Legal Involvement Pertinent to Current Situation/Hospitalization: No - Comment as needed ? ?Activities of Daily Living ?Home Assistive Devices/Equipment: Dentures (specify type), Hearing aid ?ADL Screening (condition at time of admission) ?Patient's cognitive ability adequate to safely complete daily activities?: No ?Is the patient deaf or have difficulty hearing?: No ?Does the patient have difficulty seeing, even when wearing glasses/contacts?: No ?Does the patient have difficulty concentrating, remembering, or making decisions?: Yes ?Patient able  to express need for assistance with ADLs?: No ?Does the patient have difficulty dressing or bathing?: Yes ?Independently performs ADLs?: No ?Does the patient have difficulty walking or climbing stairs?: Yes ?Weakness of Legs: Both ?Weakness of Arms/Hands: Both ? ?Permission Sought/Granted ?  ?  ?   ?   ?   ?   ? ?Emotional Assessment ?Appearance:: Appears stated age ?  ?  ?  ?Alcohol / Substance Use: Not Applicable ?Psych Involvement: No (comment) ? ?Admission diagnosis:  Sepsis due to undetermined organism [facility]) [A41.9] ?Patient Active Problem List  ? Diagnosis Date Noted  ? Protein-calorie malnutrition, severe 06/19/2021  ? Encephalopathy   ? Contusion of scalp   ? Fever   ? Delirium   ? Agitation   ? Bacteremia due to Proteus species   ? Cellulitis of left lower extremity   ? Hypokalemia 05/21/2021  ? Acute metabolic encephalopathy 29/92/4268  ? Pressure injury of skin 05/21/2021  ? Sepsis due to undetermined organism ([place]) 03/30/2020  ? Hypomagnesemia 03/30/2020  ? Hypophosphatemia 03/30/2020  ? Hyperglycemia   ? Iliopsoas abscess on left ([place]) 10/04/2017  ? HCAP (healthcare-associated pneumonia) 10/04/2017  ? Acute lower UTI 10/04/2017  ? Hallucinations 07/25/2017  ? Abnormal LFTs 07/24/2017  ? Acute on chronic diastolic CHF (congestive heart failure) ([place])   ? Elevated LFTs   ? AKI (acute kidney injury) ([place])   ? Elevated troponin   ? Stroke, acute, embolic ([place]) 34/19/6222  ? Mitral regurgitation 06/17/2017  ? Thyroid  nodule 06/15/2017  ? CVA (cerebral vascular accident) ([place]) 06/14/2017  ? Peripheral vascular disease ([place])   ? Hiatal hernia   ? Chronic diastolic (congestive) heart failure (HCC)   ? Carpal tunnel syndrome   ? Chronic atrial fibrillation with RVR (HCC)   ? Anemia   ? Actinic keratosis   ? Acute CHF (congestive heart failure) ([place]) 05/20/2016  ? History of GI bleed 03/29/2016  ? Chronic anticoagulation 03/29/2016  ? Symptomatic anemia 03/19/2016  ? Spinal stenosis of lumbar region 02/10/2016  ?  Varicose veins of left leg with edema 09/15/2015  ? Iron deficiency anemia due to chronic blood loss 07/17/2015  ? Anemia of chronic renal failure, stage 3 (moderate) ([place]) 07/17/2015  ? Anticoagulant causing adverse effect in therapeutic use 07/17/2015  ? Symptomatic bradycardia 02/21/2015  ? Bradycardia   ? Abdominal wall mass of right lower quadrant 07/08/2013  ? OA (osteoarthritis) of hip 04/03/2013  ? Osteoarthritis of hip 01/18/2013  ? Night sweats 09/06/2012  ? Chest pain   ? Anxiety and depression   ? Gastroesophageal reflux disease   ? Hepatitis   ? Jaundice   ? Hypertension 07/01/2010  ? Type 2 diabetes mellitus with hyperlipidemia ([place]) 07/01/2010  ? Rheumatoid arthritis ([place]) 07/01/2010  ? Sinus tachycardia 07/01/2010  ? Chest pain, atypical 07/01/2010  ? ?PCP:  Aletha Halim., PA-C ?Pharmacy:   ?[facility] [place] ?[address] ?[place] [address] ?Phone: [phone] Fax: [phone] ? ? ? ? ?Social Determinants of Health (SDOH) Interventions ?  ? ?Readmission Risk Interventions ? ?  06/29/2021  ? 11:03 AM 05/21/2021  ? 11:40 AM 04/01/2020  ? 12:47 PM  ?Readmission Risk Prevention Plan  ?Transportation Screening Complete Complete Complete  ?PCP or Specialist Appt within 3-5 Days   Not Complete  ?Not Complete comments   Patient's PCP appointments are set up at her facility  ?[place] or Home Care Consult   Complete  ?Social Work Consult for [place] Planning/Counseling   Complete  ?Palliative Care Screening   Complete  ?Medication Review [employer]) Complete Complete Complete  ?[place] or Home Care Consult Complete    ?SW Recovery Care/Counseling Consult Complete Complete   ?Palliative Care Screening Complete    ?Skilled Nursing Facility Complete Not Applicable   ? ? ? ?

## 2021-06-29 NOTE — Progress Notes (Signed)
?PROGRESS NOTE ? ? ? [name]  BTD:[id number] DOB: 04-21-1945 DOA: 06/28/2021 ?PCP: Aletha Halim., PA-C ? ? ?Brief Narrative:  ?Per HPI: ?Patient brought to ED 06/28/2021 via EMS from SNF with reports of increased lethargy for "a few days."  No other information available.  Per ED provider notes, patient will open eyes to voice although does not participate in exam or history.   ?In ED, patient tachypneic, hypoxic, febrile.  Code sepsis activated.  Patient was given multiple fluid boluses with lactated Ringer's, given Tylenol, started on Cardizem drip, started on cefepime and meropenem broad-spectrum antibiotics.  Recent admission for UTI, urinalysis ED no definitive UTI, WBC 10.9, hemoglobin recently 9.5, 11.6 now, chest x-ray showing minimal bibasilar atelectasis, CT chest, abdomen, pelvis no obvious intrathoracic or intra-abdominal infectious process, CT head no acute intracranial process. ? ?3/28: Patient was admitted for possible sepsis and has been started on vancomycin and Merrem empirically.  She is also noted to have atrial fibrillation with RVR for which she has been started on IV Cardizem drip.  Renal ultrasound pending for further evaluation of cyst/mass noted on CT.  ? ? ?Assessment & Plan: ?  ?Principal Problem: ?  Sepsis due to undetermined organism [facility]) ?Active Problems: ?  Hypertension ?  Type 2 diabetes mellitus with hyperlipidemia ([place]) ?  Rheumatoid arthritis ([place]) ?  Gastroesophageal reflux disease ?  Anemia of chronic renal failure, stage 3 (moderate) (HCC) ?  Chronic diastolic (congestive) heart failure (HCC) ?  Chronic atrial fibrillation with RVR (HCC) ?  CVA (cerebral vascular accident) [facility]) ?  Acute metabolic encephalopathy ?  Pressure injury of skin ?  Protein-calorie malnutrition, severe ? ?Assessment and Plan: ? ? ?Sepsis due to undetermined organism [facility]) ?Continue IV antibiotics as ordered for now  ?Sepsis appears less likely at the moment, but will continue to monitor  cultures ?Monitor a.m. labs ?  ?Chronic atrial fibrillation with RVR (HCC) ?Maintain cardizem gtt and telemetry ?Maintain Xarelto   ?Resume Cardizem as prior and 120 mg every 6 hours and p.o. metoprolol ?  ?Chronic diastolic (congestive) heart failure (HCC) ?Not in exacerbation, last Echo 05/21/2021 no concerns and EF WNL ?  ?Rheumatoid arthritis ([place]) ?Continue home prednisone  ?May be contributing to the elevated WBC count ?  ?Type 2 diabetes mellitus with hyperlipidemia ([place]) ?Maintain SSI ?  ?Hypertension ?Currently controlled  ?On cardizem gtt for Afib/RVR, titrate down/off ?Maintain home beta blocker  ?  ?Anemia of chronic renal failure, stage 3 (moderate) (HCC) ?No s/s bleeding, pt is on chronic anticoagulation ?monitro routine CBC ?  ?Gastroesophageal reflux disease ?Continue PPI ?  ?Acute metabolic encephalopathy ?Likely d/t sepsis ?Swallow eval pending ?  ?Pressure injury of skin ?Minimal erythema, no obvious cellulitis ?Wound care consulted ?Continue broad spectrum abx  ?  ?Protein-calorie malnutrition, severe ?Confers poor prognosis if not able to optimize diet  ?With recurrent admission, appreciate palliative care consultation ?  ? ?DVT prophylaxis: Xarelto ?Code Status: DNR ?Family Communication: Discussed with son on phone 3/28 ?Disposition Plan:  ?Status is: Inpatient ?Remains inpatient appropriate because: Need for IV medications. ? ?Skin Assessment: ? ?I have examined the patient?s skin and I agree with the wound assessment as performed by the wound care RN as outlined below: ? ?Pressure Injury 05/21/21 Buttocks Right Stage 2 -  Partial thickness loss of dermis presenting as a shallow open injury with a red, pink wound bed without slough. (Active)  ?05/21/21 1131  ?Location: Buttocks  ?Location Orientation: Right  ?Staging: Stage 2 -  Partial thickness loss of dermis presenting as a shallow open injury with a red, pink wound bed without slough.  ?Wound Description (Comments):   ?Present on  Admission: Yes  ?Dressing Type Foam - Lift dressing to assess site every shift 06/17/21 2041  ?   ?Pressure Injury 05/21/21 Buttocks Right Stage 2 -  Partial thickness loss of dermis presenting as a shallow open injury with a red, pink wound bed without slough. (Active)  ?05/21/21 1132  ?Location: Buttocks  ?Location Orientation: Right  ?Staging: Stage 2 -  Partial thickness loss of dermis presenting as a shallow open injury with a red, pink wound bed without slough.  ?Wound Description (Comments):   ?Present on Admission: Yes  ?Dressing Type Foam - Lift dressing to assess site every shift 06/17/21 0800  ?   ?Pressure Injury 06/10/21 Heel Left Stage 2 -  Partial thickness loss of dermis presenting as a shallow open injury with a red, pink wound bed without slough. (Active)  ?06/10/21 1435  ?Location: Heel  ?Location Orientation: Left (Heel)  ?Staging: Stage 2 -  Partial thickness loss of dermis presenting as a shallow open injury with a red, pink wound bed without slough.  ?Wound Description (Comments):   ?Present on Admission: Yes  ?Dressing Type Foam - Lift dressing to assess site every shift 06/20/21 1640  ? ? ?Consultants:  ?Palliative care ? ?Procedures:  ?See below ? ?Antimicrobials:  ?Anti-infectives (From admission, onward)  ? ? Start     Dose/Rate Route Frequency Ordered Stop  ? 06/29/21 2100  vancomycin (VANCOREADY) IVPB 1500 mg/300 mL       ? 1,500 mg ?150 mL/hr over 120 Minutes Intravenous Every 24 hours 06/28/21 2025    ? 06/28/21 2030  vancomycin (VANCOREADY) IVPB 1750 mg/350 mL       ? 1,750 mg ?175 mL/hr over 120 Minutes Intravenous  Once 06/28/21 2025 06/28/21 2240  ? 06/28/21 1700  meropenem (MERREM) 1 g in sodium chloride 0.9 % 100 mL IVPB       ? 1 g ?200 mL/hr over 30 Minutes Intravenous Every 8 hours 06/28/21 1623    ? 06/28/21 1600  ceFEPIme (MAXIPIME) 2 g in sodium chloride 0.9 % 100 mL IVPB  Status:  Discontinued       ? 2 g ?200 mL/hr over 30 Minutes Intravenous  Once 06/28/21 1557 06/28/21  1621  ? ?  ? ? ?Subjective: ?Patient seen and evaluated today and is slightly confused, otherwise alert and responding to questions appropriately. ? ?Objective: ?Vitals:  ? 06/29/21 0700 06/29/21 0800 06/29/21 0900 06/29/21 1000  ?BP: 118/75 129/75 (!) 132/49 (!) 144/105  ?Pulse: 91 76 88 66  ?Resp: '18 15 18 17  '$ ?Temp: (!) 97.1 ?F (36.2 ?C)     ?TempSrc: Axillary     ?SpO2: 100% 100% 96% 98%  ?Weight:      ?Height:      ? ? ?Intake/Output Summary (Last 24 hours) at 06/29/2021 1058 ?Last data filed at 06/29/2021 0740 ?Gross per 24 hour  ?Intake 3015.96 ml  ?Output 13 ml  ?Net 3002.96 ml  ? ?Filed Weights  ? 06/28/21 1532 06/29/21 0450  ?Weight: 71.1 kg 66.3 kg  ? ? ?Examination: ? ?General exam: Appears calm and comfortable  ?Respiratory system: Clear to auscultation. Respiratory effort normal.  On nasal cannula. ?Cardiovascular system: S1 & S2 heard, irregular and tachycardic ?Gastrointestinal system: Abdomen is soft ?Central nervous system: Alert and awake ?Extremities: No edema ?Skin: No significant lesions noted ?Psychiatry:  Flat affect. ? ? ? ?Data Reviewed: I have personally reviewed following labs and imaging studies ? ?CBC: ?Recent Labs  ?Lab 06/28/21 ?1525 06/29/21 ?8295  ?WBC 10.9* 10.9*  ?NEUTROABS 9.5*  --   ?HGB 11.6* 9.8*  ?HCT 39.6 34.2*  ?MCV 88.2 89.8  ?PLT 503* 419*  ? ?Basic Metabolic Panel: ?Recent Labs  ?Lab 06/28/21 ?1525 06/29/21 ?6213  ?NA 144 142  ?K 3.5 3.4*  ?CL 111 110  ?CO2 23 23  ?GLUCOSE 140* 114*  ?BUN 21 17  ?CREATININE 0.70 0.64  ?CALCIUM 8.9 8.3*  ? ?GFR: ?Estimated Creatinine Clearance: 58.2 mL/min (by C-G formula based on SCr of 0.64 mg/dL). ?Liver Function Tests: ?Recent Labs  ?Lab 06/28/21 ?1525 06/29/21 ?0865  ?AST 19 19  ?ALT 15 15  ?ALKPHOS 172* 134*  ?BILITOT 0.9 0.7  ?PROT 6.8 5.8*  ?ALBUMIN 2.7* 2.2*  ? ?No results for input(s): LIPASE, AMYLASE in the last 168 hours. ?No results for input(s): AMMONIA in the last 168 hours. ?Coagulation Profile: ?Recent Labs  ?Lab  06/28/21 ?1525 06/29/21 ?7846  ?INR 1.2 1.2  ? ?Cardiac Enzymes: ?No results for input(s): CKTOTAL, CKMB, CKMBINDEX, TROPONINI in the last 168 hours. ?BNP (last 3 results) ?No results for input(s): PROBNP in the last 8760

## 2021-06-30 LAB — BASIC METABOLIC PANEL
Anion gap: 5 (ref 5–15)
BUN: 24 mg/dL — ABNORMAL HIGH (ref 8–23)
CO2: 25 mmol/L (ref 22–32)
Calcium: 8.1 mg/dL — ABNORMAL LOW (ref 8.9–10.3)
Chloride: 110 mmol/L (ref 98–111)
Creatinine, Ser: 0.71 mg/dL (ref 0.44–1.00)
GFR, Estimated: >60 mL/min (ref >60)
Glucose, Bld: 136 mg/dL — ABNORMAL HIGH (ref 70–99)
Potassium: 3.7 mmol/L (ref 3.5–5.1)
Sodium: 140 mmol/L (ref 135–145)

## 2021-06-30 LAB — URINE CULTURE: Culture: NO GROWTH

## 2021-06-30 LAB — GLUCOSE, CAPILLARY
Glucose-Capillary: 114 mg/dL — ABNORMAL HIGH (ref 70–99)
Glucose-Capillary: 116 mg/dL — ABNORMAL HIGH (ref 70–99)

## 2021-06-30 LAB — CBC
HCT: 26.9 % — ABNORMAL LOW (ref 36.0–46.0)
Hemoglobin: 8 g/dL — ABNORMAL LOW (ref 12.0–15.0)
MCH: 26.2 pg (ref 26.0–34.0)
MCHC: 29.7 g/dL — ABNORMAL LOW (ref 30.0–36.0)
MCV: 88.2 fL (ref 80.0–100.0)
Platelets: 350 10*3/uL (ref 150–400)
RBC: 3.05 MIL/uL — ABNORMAL LOW (ref 3.87–5.11)
RDW: 19.7 % — ABNORMAL HIGH (ref 11.5–15.5)
WBC: 9.3 10*3/uL (ref 4.0–10.5)
nRBC: 0 % (ref 0.0–0.2)

## 2021-06-30 LAB — MAGNESIUM: Magnesium: 1.5 mg/dL — ABNORMAL LOW (ref 1.7–2.4)

## 2021-06-30 MED ORDER — MAGNESIUM SULFATE 2 GM/50ML IV SOLN
2.0000 g | Freq: Once | INTRAVENOUS | Status: AC
  Administered 2021-06-30: 2 g via INTRAVENOUS
  Filled 2021-06-30: qty 50

## 2021-06-30 MED ORDER — DICLOFENAC SODIUM 1 % EX GEL
2.0000 g | Freq: Every evening | CUTANEOUS | Status: DC | PRN
  Filled 2021-06-30: qty 100

## 2021-06-30 MED ORDER — OXYCODONE HCL 5 MG PO TABS: 7.5000 mg | ORAL_TABLET | Freq: Three times a day (TID) | ORAL | 0 refills | Status: DC | PRN

## 2021-06-30 MED ORDER — DILTIAZEM HCL ER COATED BEADS 240 MG PO TB24: 240.0000 mg | ORAL_TABLET | Freq: Two times a day (BID) | ORAL | 1 refills | Status: DC

## 2021-06-30 NOTE — Progress Notes (Signed)
Report called to [facility]. Patient discharged to facility via EMS in stable condition.   ?

## 2021-06-30 NOTE — Discharge Summary (Signed)
Physician Discharge Summary  ?[name] TKP:[id number] DOB: Feb 03, 1946 DOA: 06/28/2021 ? ?PCP: Aletha Halim., PA-C ? ?Admit date: 06/28/2021 ? ?Discharge date: 06/30/2021 ? ?Admitted From:SNF ? ?Disposition:  SNF ? ?Recommendations for Outpatient Follow-up:  ?Follow up with PCP in 1-2 weeks ?Follow-up with cardiology in 1-2 weeks ?Cardizem dosing changed to 240 mg twice daily for better heart rate control ?Continue other home medications as previous ? ?Home Health: None ? ?Equipment/Devices: None ? ?Discharge Condition:Stable ? ?CODE STATUS: DNR ? ?Diet recommendation: Heart Healthy ? ?Brief/Interim Summary: ?Per HPI: ?Patient brought to ED 06/28/2021 via EMS from SNF with reports of increased lethargy for "a few days."  No other information available.  Per ED provider notes, patient will open eyes to voice although does not participate in exam or history.   ?In ED, patient tachypneic, hypoxic, febrile.  Code sepsis activated.  Patient was given multiple fluid boluses with lactated Ringer's, given Tylenol, started on Cardizem drip, started on cefepime and meropenem broad-spectrum antibiotics.  Recent admission for UTI, urinalysis ED no definitive UTI, WBC 10.9, hemoglobin recently 9.5, 11.6 now, chest x-ray showing minimal bibasilar atelectasis, CT chest, abdomen, pelvis no obvious intrathoracic or intra-abdominal infectious process, CT head no acute intracranial process. ?  ?3/28: Patient was admitted for possible sepsis and has been started on vancomycin and Merrem empirically.  She is also noted to have atrial fibrillation with RVR for which she has been started on IV Cardizem drip.  Renal ultrasound pending for further evaluation of cyst/mass noted on CT.  ? ?3/29: Renal ultrasound with findings of simple cyst noted.  Heart rate control has been achieved and maintained overnight on oral dosing of Cardizem 120 mg every 6 hours.  This was reviewed with cardiology who now recommends 240 mg twice daily for  better heart rate control.  No sign of infection otherwise noted and antibiotics have been discontinued.  Blood cultures negative.  She is stable for discharge back to SNF. ? ?Discharge Diagnoses:  ?Principal Problem: ?  Sepsis due to undetermined organism [name]) ?Active Problems: ?  Hypertension ?  Type 2 diabetes mellitus with hyperlipidemia ([place]) ?  Rheumatoid arthritis ([place]) ?  Gastroesophageal reflux disease ?  Anemia of chronic renal failure, stage 3 (moderate) (HCC) ?  Chronic diastolic (congestive) heart failure (HCC) ?  Chronic atrial fibrillation with RVR (HCC) ?  CVA (cerebral vascular accident) [facility]) ?  Acute metabolic encephalopathy ?  Pressure injury of skin ?  Protein-calorie malnutrition, severe ? ?Principal discharge diagnosis: Atrial fibrillation with RVR.  Sepsis-ruled out. ? ?Discharge Instructions ? ?Discharge Instructions   ? ? Diet - low sodium heart healthy   Complete by: As directed ?  ? Discharge wound care:   Complete by: As directed ?  ? Wound care  Daily      ?Comments: Place a foam dressing over the wound on the coccyx. Change every 3 days and prn. ?   ?Wound care  Every shift      ?Comments: Apply iodine from the swabsticks or swab pads from clean utility to all scabbed areas on BLE and feet.  Allow to air dry  ? Increase activity slowly   Complete by: As directed ?  ? ?  ? ?Allergies as of 06/30/2021   ? ?   Reactions  ? Promethazine   ? ?  ? ?  ?Medication List  ?  ? ?STOP taking these medications   ? ?diltiazem 360 MG 24 hr capsule ?Commonly known as: Cardizem  CD ?Replaced by: diltiazem 240 MG 24 hr tablet ?  ? ?  ? ?TAKE these medications   ? ?acetaminophen 650 MG CR tablet ?Commonly known as: TYLENOL ?Take 1,300 mg by mouth in the morning and at bedtime. ?  ?albuterol 108 (90 Base) MCG/ACT inhaler ?Commonly known as: VENTOLIN HFA ?Inhale 2 puffs into the lungs every 4 (four) hours as needed for wheezing or shortness of breath. ?  ?Artificial Tears 1 % ophthalmic solution ?Generic  drug: carboxymethylcellulose ?Place 2 drops into both eyes in the morning. ?  ?atorvastatin 20 MG tablet ?Commonly known as: LIPITOR ?Take 20 mg by mouth daily. ?  ?azelastine 0.05 % ophthalmic solution ?Commonly known as: OPTIVAR ?Place 1 drop into both eyes 2 (two) times daily. ?  ?buPROPion ER 100 MG 12 hr tablet ?Commonly known as: Wellbutrin SR ?Take 1 tablet (100 mg total) by mouth 2 (two) times daily. ?  ?Calcium 1200 1200-1000 MG-UNIT Chew ?1 tablet with a meal ?  ?cetirizine 10 MG tablet ?Commonly known as: ZYRTEC ?1 tablet ?  ?cycloSPORINE 0.05 % ophthalmic emulsion ?Commonly known as: RESTASIS ?Place 1 drop into both eyes 2 (two) times daily. ?  ?diclofenac Sodium 1 % Gel ?Commonly known as: VOLTAREN ?Apply 2 g topically at bedtime as needed for pain. ?  ?diltiazem 240 MG 24 hr tablet ?Commonly known as: Cardizem LA ?Take 1 tablet (240 mg total) by mouth 2 (two) times daily. ?Replaces: diltiazem 360 MG 24 hr capsule ?  ?feeding supplement (PRO-STAT 64) Liqd ?Take 30 mLs by mouth in the morning. ?  ?feeding supplement Liqd ?Take 237 mLs by mouth 2 (two) times daily between meals. ?  ?fluticasone-salmeterol 115-21 MCG/ACT inhaler ?Commonly known as: ADVAIR HFA ?Inhale 2 puffs into the lungs 2 times daily at 12 noon and 4 pm. ?  ?furosemide 40 MG tablet ?Commonly known as: LASIX ?Take 0.5 tablets (20 mg total) by mouth daily as needed for fluid or edema. ?  ?gabapentin 100 MG capsule ?Commonly known as: NEURONTIN ?Take 3 capsules (300 mg total) by mouth 3 (three) times daily. ?What changed:  ?how much to take ?when to take this ?  ?insulin glargine 100 UNIT/ML injection ?Commonly known as: LANTUS ?Inject 30 Units into the skin in the morning. Hold if BS is less than 150 ?  ?insulin lispro 100 UNIT/ML injection ?Commonly known as: HUMALOG ?Inject 6 Units into the skin in the morning and at bedtime. Hold is BS <100 ?  ?magnesium oxide 400 MG tablet ?Commonly known as: MAG-OX ?Take 400 mg by mouth daily. ?   ?metFORMIN 500 MG 24 hr tablet ?Commonly known as: GLUCOPHAGE-XR ?Take 1,000 mg by mouth 2 (two) times daily. ?  ?metoprolol tartrate 100 MG tablet ?Commonly known as: LOPRESSOR ?Take 1 tablet (100 mg total) by mouth 2 (two) times daily. ?  ?multivitamin tablet ?Take 1 tablet by mouth daily. ?  ?nitroGLYCERIN 0.4 MG SL tablet ?Commonly known as: NITROSTAT ?Place 1 tablet (0.4 mg total) under the tongue every 5 (five) minutes as needed for chest pain (x 3 doses). ?  ?OcuSoft Lid Scrub Original Pads ?Apply 1 application topically in the morning and at bedtime. Apply to both eyelids for eye problem ?  ?omeprazole 20 MG capsule ?Commonly known as: PRILOSEC ?Take 1 capsule by mouth daily. ?  ?oxyCODONE 5 MG immediate release tablet ?Commonly known as: Oxy IR/ROXICODONE ?Take 1.5 tablets (7.5 mg total) by mouth every 8 (eight) hours as needed for severe pain. ?  ?polyethylene  glycol 17 g packet ?Commonly known as: MIRALAX / GLYCOLAX ?Take 17 g by mouth 2 (two) times daily as needed for mild constipation. ?  ?predniSONE 5 MG tablet ?Commonly known as: DELTASONE ?Take 5 mg by mouth daily. ?  ?PreserVision AREDS 2 Caps ?Take 1 capsule by mouth daily. ?  ?rivaroxaban 20 MG Tabs tablet ?Commonly known as: XARELTO ?Take 1 tablet (20 mg total) by mouth daily at 6 PM. ?  ?saccharomyces boulardii 250 MG capsule ?Commonly known as: FLORASTOR ?Take 250 mg by mouth 2 (two) times daily. ?  ?senna 8.6 MG Tabs tablet ?Commonly known as: SENOKOT ?Take 1 tablet by mouth daily. ?  ?Vascepa 1 g capsule ?Generic drug: icosapent Ethyl ?Take 1 capsule by mouth every morning. ?  ?VITAMIN C PO ?Take 500 mg by mouth in the morning and at bedtime. ?  ?zinc sulfate 220 (50 Zn) MG capsule ?Take 220 mg by mouth daily. ?  ? ?  ? ?  ?  ? ? ?  ?Discharge Care Instructions  ?(From admission, onward)  ?  ? ? ?  ? ?  Start     Ordered  ? 06/30/21 0000  Discharge wound care:       ?Comments: Wound care  Daily      ?Comments: Place a foam dressing over the  wound on the coccyx. Change every 3 days and prn. ?   ?Wound care  Every shift      ?Comments: Apply iodine from the swabsticks or swab pads from clean utility to all scabbed areas on BLE and feet.  Allow to air

## 2021-06-30 NOTE — TOC Transition Note (Signed)
Transition of Care (TOC) - CM/SW Discharge Note ? ? ?Patient Details  ?Name: [name] ?MRN: [id number] ?Date of Birth: 1946/02/06 ? ?Transition of Care (TOC) CM/SW Contact:  ?Salome Arnt, LCSW ?Phone Number: ?06/30/2021, 12:49 PM ? ? ?Clinical Narrative:  Pt d/c today and will return to [facility]. Facility and pt's son aware and agreeable. Per SNF, pt will return under Medicaid. D/C summary sent on hub. Pt will transport via [facility]. RN given number to call report.    ? ? ? ?Final next level of care: [place] ?Barriers to Discharge: Barriers Resolved ? ? ?Patient Goals and CMS Choice ?Patient states their goals for this hospitalization and ongoing recovery are:: return to facility ?  ?  ? ?Discharge Placement ?  ?           ?  ?Patient to be transferred to facility by: [facility] ?Name of family member notified: Fara Olden- son ?Patient and family notified of of transfer: 06/30/21 ? ?Discharge Plan and Services ?In-house Referral: Clinical Social Work ?  ?Post Acute Care Choice: [place]          ?  ?  ?  ?  ?  ?  ?  ?  ?  ?  ? ?Social Determinants of Health (SDOH) Interventions ?  ? ? ?Readmission Risk Interventions ? ?  06/29/2021  ? 11:03 AM 05/21/2021  ? 11:40 AM 04/01/2020  ? 12:47 PM  ?Readmission Risk Prevention Plan  ?Transportation Screening Complete Complete Complete  ?PCP or Specialist Appt within 3-5 Days   Not Complete  ?Not Complete comments   Patient's PCP appointments are set up at her facility  ?[place] or Home Care Consult   Complete  ?Social Work Consult for [place] Planning/Counseling   Complete  ?Palliative Care Screening   Complete  ?Medication Review [employer]) Complete Complete Complete  ?[place] or Home Care Consult Complete    ?SW Recovery Care/Counseling Consult Complete Complete   ?Palliative Care Screening Complete    ?Skilled Nursing Facility Complete Not Applicable   ? ? ? ? ? ?

## 2021-06-30 NOTE — [place] FL2 (Signed)
?[place] MEDICAID FL2 LEVEL OF CARE SCREENING TOOL  ?  ? ?IDENTIFICATION  ?Patient Name: ?ZAINEB NOWACZYK Birthdate: [birth year] Sex: female Admission Date (Current Location): ?06/28/2021  ?[place] and [place] Number: ? [place] and Address:  ?[facility] [address] [place] ?     Provider Number: ?[id number]  ?Attending Physician Name and Address:  ?Manuella Ghazi, Pratik D, DO ? Relative Name and Phone Number:  ?  ?   ?Current Level of Care: ?Hospital Recommended Level of Care: ?Nursing Facility Prior Approval Number: ?  ? ?Date Approved/Denied: ?  PASRR Number: ?  ? ?Discharge Plan: ?SNF ?  ? ?Current Diagnoses: ?Patient Active Problem List  ? Diagnosis Date Noted  ? Protein-calorie malnutrition, severe 06/19/2021  ? Encephalopathy   ? Contusion of scalp   ? Fever   ? Delirium   ? Agitation   ? Bacteremia due to Proteus species   ? Cellulitis of left lower extremity   ? Hypokalemia 05/21/2021  ? Acute metabolic encephalopathy 44/06/4740  ? Pressure injury of skin 05/21/2021  ? Sepsis due to undetermined organism ([place]) 03/30/2020  ? Hypomagnesemia 03/30/2020  ? Hypophosphatemia 03/30/2020  ? Hyperglycemia   ? Iliopsoas abscess on left ([place]) 10/04/2017  ? HCAP (healthcare-associated pneumonia) 10/04/2017  ? Acute lower UTI 10/04/2017  ? Hallucinations 07/25/2017  ? Abnormal LFTs 07/24/2017  ? Acute on chronic diastolic CHF (congestive heart failure) ([place])   ? Elevated LFTs   ? AKI (acute kidney injury) ([place])   ? Elevated troponin   ? Stroke, acute, embolic ([place]) 59/56/3875  ? Mitral regurgitation 06/17/2017  ? Thyroid nodule 06/15/2017  ? CVA (cerebral vascular accident) ([place]) 06/14/2017  ? Peripheral vascular disease ([place])   ? Hiatal hernia   ? Chronic diastolic (congestive) heart failure (HCC)   ? Carpal tunnel syndrome   ? Chronic atrial fibrillation with RVR (HCC)   ? Anemia   ? Actinic keratosis   ? Acute CHF (congestive heart failure) ([place]) 05/20/2016  ? History of GI bleed  03/29/2016  ? Chronic anticoagulation 03/29/2016  ? Symptomatic anemia 03/19/2016  ? Spinal stenosis of lumbar region 02/10/2016  ? Varicose veins of left leg with edema 09/15/2015  ? Iron deficiency anemia due to chronic blood loss 07/17/2015  ? Anemia of chronic renal failure, stage 3 (moderate) ([place]) 07/17/2015  ? Anticoagulant causing adverse effect in therapeutic use 07/17/2015  ? Symptomatic bradycardia 02/21/2015  ? Bradycardia   ? Abdominal wall mass of right lower quadrant 07/08/2013  ? OA (osteoarthritis) of hip 04/03/2013  ? Osteoarthritis of hip 01/18/2013  ? Night sweats 09/06/2012  ? Chest pain   ? Anxiety and depression   ? Gastroesophageal reflux disease   ? Hepatitis   ? Jaundice   ? Hypertension 07/01/2010  ? Type 2 diabetes mellitus with hyperlipidemia ([place]) 07/01/2010  ? Rheumatoid arthritis ([place]) 07/01/2010  ? Sinus tachycardia 07/01/2010  ? Chest pain, atypical 07/01/2010  ? ? ?Orientation RESPIRATION BLADDER Height & Weight   ?  ?Self ? Normal External catheter Weight: 153 lb 10.6 oz (69.7 kg) ?Height:  '5\' 7"'$  (170.2 cm)  ?BEHAVIORAL SYMPTOMS/MOOD NEUROLOGICAL BOWEL NUTRITION STATUS  ?    Incontinent Diet (low sodium heart healthy)  ?AMBULATORY STATUS COMMUNICATION OF NEEDS Skin   ?Extensive Assist Verbally Other (Comment), Skin abrasions (redness to groin, perineum. Pressure injury to coccyx with foam dressing.) ?  ?  ?  ?    ?     ?     ? ? ?  Personal Care Assistance Level of Assistance  ?Bathing, Feeding, Dressing Bathing Assistance: Maximum assistance ?  ?Dressing Assistance: Maximum assistance ?   ? ?Functional Limitations Info  ?Sight, Hearing, Speech Sight Info: Impaired ?Hearing Info: Impaired ?Speech Info: Adequate  ? ? ?SPECIAL CARE FACTORS FREQUENCY  ?    ?  ?  ?  ?  ?  ?  ?   ? ? ?Contractures Contractures Info: Not present  ? ? ?Additional Factors Info  ?Isolation Precautions Code Status Info: DNR ?Allergies Info: Promethazine ?  ?  ?Isolation Precautions Info: ESBL 06/10/21, MRSA  06/10/21 (on contact precautions) ?   ? ?Current Medications (06/30/2021):  This is the current hospital active medication list ?Current Facility-Administered Medications  ?Medication Dose Route Frequency Provider Last Rate Last Admin  ? (feeding supplement) PROSource Plus liquid 30 mL  30 mL Oral Daily Pierce, Dwayne A, RPH      ? albuterol (PROVENTIL) (2.5 MG/3ML) 0.083% nebulizer solution 2.5 mg  2.5 mg Nebulization Q4H PRN Pierce, Dwayne A, RPH      ? ascorbic acid (VITAMIN C) tablet 500 mg  500 mg Oral Daily Emeterio Reeve, DO   500 mg at 06/30/21 7530  ? atorvastatin (LIPITOR) tablet 20 mg  20 mg Oral Daily Emeterio Reeve, DO   20 mg at 06/30/21 0511  ? buPROPion ER [facility] SR) 12 hr tablet 100 mg  100 mg Oral BID Emeterio Reeve, DO   100 mg at 06/30/21 0211  ? calcium carbonate (OS-CAL - dosed in mg of elemental calcium) tablet 500 mg of elemental calcium  1 tablet Oral Daily Emeterio Reeve, DO   500 mg of elemental calcium at 06/30/21 0853  ? Chlorhexidine Gluconate Cloth 2 % PADS 6 each  6 each Topical Daily Heath Lark D, DO   6 each at 06/29/21 1024  ? cycloSPORINE (RESTASIS) 0.05 % ophthalmic emulsion 1 drop  1 drop Both Eyes BID Emeterio Reeve, DO   1 drop at 06/30/21 1735  ? diclofenac Sodium (VOLTAREN) 1 % topical gel 2 g  2 g Topical QHS PRN Adefeso, Oladapo, DO      ? diltiazem (CARDIZEM) 125 mg in dextrose 5% 125 mL (1 mg/mL) infusion  5-15 mg/hr Intravenous Continuous Emeterio Reeve, DO   Stopped at 06/29/21 1453  ? diltiazem (CARDIZEM) tablet 120 mg  120 mg Oral Q6H Shah, Pratik D, DO   120 mg at 06/30/21 6701  ? feeding supplement (ENSURE ENLIVE / ENSURE PLUS) liquid 237 mL  237 mL Oral BID BM Emeterio Reeve, DO   237 mL at 06/29/21 1456  ? gabapentin (NEURONTIN) capsule 400 mg  400 mg Oral BID Emeterio Reeve, DO   400 mg at 06/30/21 4103  ? icosapent Ethyl (VASCEPA) 1 g capsule 1 g  1 g Oral q morning Emeterio Reeve, DO   1 g at 06/29/21 1044  ? insulin  aspart (novoLOG) injection 0-5 Units  0-5 Units Subcutaneous QHS Manuella Ghazi, Pratik D, DO      ? insulin aspart (novoLOG) injection 0-9 Units  0-9 Units Subcutaneous TID WC Manuella Ghazi, Pratik D, DO   2 Units at 06/29/21 1745  ? ketorolac (TORADOL) 15 MG/ML injection 15 mg  15 mg Intravenous Q6H PRN Manuella Ghazi, Pratik D, DO   15 mg at 06/30/21 0645  ? ketotifen (ZADITOR) 0.025 % ophthalmic solution 1 drop  1 drop Both Eyes BID Emeterio Reeve, DO   1 drop at 06/30/21 0854  ? magnesium oxide (MAG-OX) tablet 400 mg  400 mg Oral Daily Emeterio Reeve, DO   400 mg at 06/30/21 4166  ? metoprolol tartrate (LOPRESSOR) tablet 100 mg  100 mg Oral BID Emeterio Reeve, DO   100 mg at 06/30/21 0630  ? mometasone-formoterol (DULERA) 200-5 MCG/ACT inhaler 2 puff  2 puff Inhalation BID Emeterio Reeve, DO   2 puff at 06/30/21 0750  ? multivitamin with minerals tablet 1 tablet  1 tablet Oral Daily Emeterio Reeve, DO   1 tablet at 06/30/21 1601  ? multivitamin-lutein (OCUVITE-LUTEIN) capsule 1 capsule  1 capsule Oral Daily Manuella Ghazi, Pratik D, DO   1 capsule at 06/29/21 1220  ? pantoprazole (PROTONIX) EC tablet 40 mg  40 mg Oral Daily Emeterio Reeve, DO   40 mg at 06/30/21 0932  ? polyethylene glycol (MIRALAX / GLYCOLAX) packet 17 g  17 g Oral BID PRN Emeterio Reeve, DO      ? polyvinyl alcohol (LIQUIFILM TEARS) 1.4 % ophthalmic solution 2 drop  2 drop Both Eyes Daily Joselyn Glassman A, RPH   2 drop at 06/30/21 3557  ? predniSONE (DELTASONE) tablet 5 mg  5 mg Oral Daily Emeterio Reeve, DO   5 mg at 06/29/21 1023  ? rivaroxaban (XARELTO) tablet 20 mg  20 mg Oral q1800 Emeterio Reeve, DO   20 mg at 06/29/21 1746  ? saccharomyces boulardii (FLORASTOR) capsule 250 mg  250 mg Oral BID Emeterio Reeve, DO   250 mg at 06/30/21 3220  ? senna (SENOKOT) tablet 8.6 mg  1 tablet Oral Daily Emeterio Reeve, DO   8.6 mg at 06/30/21 2542  ? zinc sulfate capsule 220 mg  220 mg Oral Daily Emeterio Reeve, DO   220 mg at 06/30/21 7062   ? ? ? ?Discharge Medications: ?Please see discharge summary for a list of discharge medications. ? ?Relevant Imaging Results: ? ?Relevant Lab Results: ? ? ?Additional Information ?SSN: [id number] ? ?Elaina Pattee,

## 2021-07-01 ENCOUNTER — Encounter (HOSPITAL_BASED_OUTPATIENT_CLINIC_OR_DEPARTMENT_OTHER): Payer: 59 | Admitting: General Surgery

## 2021-07-02 NOTE — Progress Notes (Deleted)
? ? ? ? ?HPI: FU Atrial fibrillation. Nuclear study December 2016 showed normal perfusion. Not gated because of atrial fibrillation. Patient had digoxin discontinued and metoprolol decreased previously because of bradycardia. Holter monitor January 2018 showed atrial fibrillation with mildly elevated rate. Cardizem increased.  Has history of GI bleeding from small bowel AVM.  Last echocardiogram February 2023 showed normal LV function, mild left ventricular hypertrophy, moderate pulmonary hypertension.  Recently discharged following admission for sepsis.  She was also seen for atrial fibrillation with elevated rate and her medications were adjusted.  Since she was last seen  ? ?Current Outpatient Medications  ?Medication Sig Dispense Refill  ? acetaminophen (TYLENOL) 650 MG CR tablet Take 1,300 mg by mouth in the morning and at bedtime.    ? albuterol (VENTOLIN HFA) 108 (90 Base) MCG/ACT inhaler Inhale 2 puffs into the lungs every 4 (four) hours as needed for wheezing or shortness of breath.    ? Amino Acids-Protein Hydrolys (FEEDING SUPPLEMENT, PRO-STAT 64,) LIQD Take 30 mLs by mouth in the morning.    ? Ascorbic Acid (VITAMIN C PO) Take 500 mg by mouth in the morning and at bedtime.    ? atorvastatin (LIPITOR) 20 MG tablet Take 20 mg by mouth daily.    ? azelastine (OPTIVAR) 0.05 % ophthalmic solution Place 1 drop into both eyes 2 (two) times daily.    ? buPROPion ER (WELLBUTRIN SR) 100 MG 12 hr tablet Take 1 tablet (100 mg total) by mouth 2 (two) times daily.    ? Calcium Carbonate-Vit D-Min (CALCIUM 1200) 1200-1000 MG-UNIT CHEW 1 tablet with a meal    ? carboxymethylcellulose (ARTIFICIAL TEARS) 1 % ophthalmic solution Place 2 drops into both eyes in the morning.    ? cetirizine (ZYRTEC) 10 MG tablet 1 tablet    ? cycloSPORINE (RESTASIS) 0.05 % ophthalmic emulsion Place 1 drop into both eyes 2 (two) times daily.    ? diclofenac Sodium (VOLTAREN) 1 % GEL Apply 2 g topically at bedtime as needed for pain.    ?  diltiazem (CARDIZEM LA) 240 MG 24 hr tablet Take 1 tablet (240 mg total) by mouth 2 (two) times daily. 60 tablet 1  ? Eyelid Cleansers (OCUSOFT LID SCRUB ORIGINAL) PADS Apply 1 application topically in the morning and at bedtime. Apply to both eyelids for eye problem    ? feeding supplement (ENSURE ENLIVE / ENSURE PLUS) LIQD Take 237 mLs by mouth 2 (two) times daily between meals.    ? fluticasone-salmeterol (ADVAIR HFA) 115-21 MCG/ACT inhaler Inhale 2 puffs into the lungs 2 times daily at 12 noon and 4 pm.    ? furosemide (LASIX) 40 MG tablet Take 0.5 tablets (20 mg total) by mouth daily as needed for fluid or edema.    ? gabapentin (NEURONTIN) 100 MG capsule Take 3 capsules (300 mg total) by mouth 3 (three) times daily. (Patient taking differently: Take 400 mg by mouth 2 (two) times daily.)    ? insulin glargine (LANTUS) 100 UNIT/ML injection Inject 30 Units into the skin in the morning. Hold if BS is less than 150    ? insulin lispro (HUMALOG) 100 UNIT/ML injection Inject 6 Units into the skin in the morning and at bedtime. Hold is BS <100    ? magnesium oxide (MAG-OX) 400 MG tablet Take 400 mg by mouth daily.    ? metFORMIN (GLUCOPHAGE-XR) 500 MG 24 hr tablet Take 1,000 mg by mouth 2 (two) times daily.    ? metoprolol tartrate (  LOPRESSOR) 100 MG tablet Take 1 tablet (100 mg total) by mouth 2 (two) times daily.    ? Multiple Vitamin (MULTIVITAMIN) tablet Take 1 tablet by mouth daily.    ? Multiple Vitamins-Minerals (PRESERVISION AREDS 2) CAPS Take 1 capsule by mouth daily.     ? nitroGLYCERIN (NITROSTAT) 0.4 MG SL tablet Place 1 tablet (0.4 mg total) under the tongue every 5 (five) minutes as needed for chest pain (x 3 doses). 25 tablet 2  ? omeprazole (PRILOSEC) 20 MG capsule Take 1 capsule by mouth daily.    ? oxyCODONE (OXY IR/ROXICODONE) 5 MG immediate release tablet Take 1.5 tablets (7.5 mg total) by mouth every 8 (eight) hours as needed for severe pain. 5 tablet 0  ? polyethylene glycol (MIRALAX / GLYCOLAX)  17 g packet Take 17 g by mouth 2 (two) times daily as needed for mild constipation.    ? predniSONE (DELTASONE) 5 MG tablet Take 5 mg by mouth daily.    ? rivaroxaban (XARELTO) 20 MG TABS tablet Take 1 tablet (20 mg total) by mouth daily at 6 PM. 30 tablet 2  ? saccharomyces boulardii (FLORASTOR) 250 MG capsule Take 250 mg by mouth 2 (two) times daily.    ? senna (SENOKOT) 8.6 MG TABS tablet Take 1 tablet by mouth daily.    ? VASCEPA 1 g CAPS Take 1 capsule by mouth every morning.    ? zinc sulfate 220 (50 Zn) MG capsule Take 220 mg by mouth daily.    ? ?No current facility-administered medications for this visit.  ? ? ? ?Past Medical History:  ?Diagnosis Date  ? Abnormal LFTs 07/24/2017  ? Actinic keratosis   ? Acute CVA (cerebrovascular accident) ([place])   ? Acute deep vein thrombosis (DVT) of left lower extremity ([place]) 05/06/2014  ? Anemia   ? hx of  ? Anemia of chronic renal failure, stage 3 (moderate) ([place]) 07/17/2015  ? Anticoagulant causing adverse effect in therapeutic use 07/17/2015  ? Anxiety   ? Arthritis   ? rheumatoid  ? Atrial fibrillation ([place])   ? a. persistent, she remains on Xarelto  ? Carpal tunnel syndrome   ? Chronic diastolic (congestive) heart failure (HCC)   ? a. 04/2016: echo showing a preserved EF of 55-60% with mild MR. LA and RA midly dilated.   ? Edema   ? left leg at ankle resolved now  ? Gastroesophageal reflux disease   ? Hepatitis 1970  ? not sure what kind  ? Hiatal hernia   ? Hyperlipidemia   ? Hypertension   ? Iron deficiency anemia due to chronic blood loss 07/17/2015  ? Jaundice   ? age 76  ? Peripheral vascular disease ([place]) yrs ago  ? DVT left lower leg questionale told by 2 drs i had no clot, 1 md said i did  ? Rheumatoid arthritis(714.0)   ? Right knee pain   ? Stroke [facility])   ? Type II diabetes mellitus ([place])   ? type2  ? ? ?Past Surgical History:  ?Procedure Laterality Date  ? CATARACT EXTRACTION Bilateral   ? COLONOSCOPY WITH PROPOFOL N/A 02/20/2015  ? Procedure: COLONOSCOPY WITH  PROPOFOL;  Surgeon: Carol Ada, MD;  [facility]: [facility];  Service: Endoscopy;  Laterality: N/A;  ? ENTEROSCOPY N/A 03/23/2016  ? Procedure: ENTEROSCOPY;  Surgeon: Carol Ada, MD;  [facility];  Service: Endoscopy;  Laterality: N/A;  ? ENTEROSCOPY N/A 06/17/2016  ? Procedure: ENTEROSCOPY;  Surgeon: Carol Ada, MD;  [facility];  Service: Endoscopy;  Laterality: N/A;  ? ESOPHAGOGASTRODUODENOSCOPY (EGD) WITH PROPOFOL N/A 02/20/2015  ? Procedure: ESOPHAGOGASTRODUODENOSCOPY (EGD) WITH PROPOFOL;  Surgeon: Carol Ada, MD;  [facility];  Service: Endoscopy;  Laterality: N/A;  ? GIVENS CAPSULE STUDY N/A 03/21/2016  ? Procedure: GIVENS CAPSULE STUDY;  Surgeon: Carol Ada, MD;  [facility];  Service: Endoscopy;  Laterality: N/A;  ? HAND SURGERY  1995 and 1996  ? artificial joints both hands  ? JOINT REPLACEMENT    ? LUMBAR LAMINECTOMY/DECOMPRESSION MICRODISCECTOMY N/A 02/10/2016  ? Procedure: MICROLUMBAR DECOMPRESSION L4-L5 AND L3- L4, AND EXCISION OF SYNOVIAL CYST L4-L5;  Surgeon: Susa Day, MD;  [facility];  Service: Orthopedics;  Laterality: N/A;  ? TOTAL HIP ARTHROPLASTY Left 2009  ? TOTAL HIP ARTHROPLASTY Right 04/03/2013  ? Procedure: RIGHT TOTAL HIP ARTHROPLASTY ANTERIOR APPROACH;  Surgeon: Gearlean Alf, MD;  [facility];  Service: Orthopedics;  Laterality: Right;  ? ? ?Social History  ? ?Socioeconomic History  ? Marital status: Divorced  ?  Spouse name: Not on file  ? Number of children: Not on file  ? Years of education: Not on file  ? Highest education level: Not on file  ?Occupational History  ? Occupation: Retired  ?Tobacco Use  ? Smoking status: Never  ? Smokeless tobacco: Never  ?Vaping Use  ? Vaping Use: Never used  ?Substance and Sexual Activity  ? Alcohol use: No  ?  Alcohol/week: 0.0 standard drinks  ? Drug use: No  ? Sexual activity: Not Currently  ?Other Topics Concern  ? Not on file  ?Social History Narrative  ? Not on file   ? ?Social Determinants of Health  ? ?Financial Resource Strain: Not on file  ?Food Insecurity: Not on file  ?Transportation Needs: Not on file  ?Physical Activity: Not on file  ?Stress: Not on file  ?Social [employer]

## 2021-07-03 LAB — CULTURE, BLOOD (ROUTINE X 2)
Culture: NO GROWTH
Culture: NO GROWTH
Special Requests: ADEQUATE
Special Requests: ADEQUATE

## 2021-07-05 ENCOUNTER — Ambulatory Visit: Payer: 59 | Admitting: Urology

## 2021-07-05 NOTE — Progress Notes (Deleted)
? ?Assessment: ?No diagnosis found. ? ? ?Plan: ?*** ? ?Chief Complaint: No chief complaint on file. ? ? ?History of Present Illness: ? ?[name] is a 76 y.o. year old female who is seen in consultation from Aletha Halim., PA-C for evaluation of nephrolithiasis. ? ?CT abdomen and pelvis with contrast from 06/28/2021 demonstrated a stable exophytic lesion in the right kidney thought to represent a hemorrhagic or proteinaceous cyst, no evidence of renal or ureteral calculi or obstruction. ? ? ?Past Medical History:  ?Past Medical History:  ?Diagnosis Date  ? Abnormal LFTs 07/24/2017  ? Actinic keratosis   ? Acute CVA (cerebrovascular accident) ([place])   ? Acute deep vein thrombosis (DVT) of left lower extremity ([place]) 05/06/2014  ? Anemia   ? hx of  ? Anemia of chronic renal failure, stage 3 (moderate) ([place]) 07/17/2015  ? Anticoagulant causing adverse effect in therapeutic use 07/17/2015  ? Anxiety   ? Arthritis   ? rheumatoid  ? Atrial fibrillation ([place])   ? a. persistent, she remains on Xarelto  ? Carpal tunnel syndrome   ? Chronic diastolic (congestive) heart failure (HCC)   ? a. 04/2016: echo showing a preserved EF of 55-60% with mild MR. LA and RA midly dilated.   ? Edema   ? left leg at ankle resolved now  ? Gastroesophageal reflux disease   ? Hepatitis 1970  ? not sure what kind  ? Hiatal hernia   ? Hyperlipidemia   ? Hypertension   ? Iron deficiency anemia due to chronic blood loss 07/17/2015  ? Jaundice   ? age 48  ? Peripheral vascular disease ([place]) yrs ago  ? DVT left lower leg questionale told by 2 drs i had no clot, 1 md said i did  ? Rheumatoid arthritis(714.0)   ? Right knee pain   ? Stroke [facility])   ? Type II diabetes mellitus ([place])   ? type2  ? ? ?Past Surgical History:  ?Past Surgical History:  ?Procedure Laterality Date  ? CATARACT EXTRACTION Bilateral   ? COLONOSCOPY WITH PROPOFOL N/A 02/20/2015  ? Procedure: COLONOSCOPY WITH PROPOFOL;  Surgeon: Carol Ada, MD;  [facility];  Service:  Endoscopy;  Laterality: N/A;  ? ENTEROSCOPY N/A 03/23/2016  ? Procedure: ENTEROSCOPY;  Surgeon: Carol Ada, MD;  [facility];  Service: Endoscopy;  Laterality: N/A;  ? ENTEROSCOPY N/A 06/17/2016  ? Procedure: ENTEROSCOPY;  Surgeon: Carol Ada, MD;  [facility];  Service: Endoscopy;  Laterality: N/A;  ? ESOPHAGOGASTRODUODENOSCOPY (EGD) WITH PROPOFOL N/A 02/20/2015  ? Procedure: ESOPHAGOGASTRODUODENOSCOPY (EGD) WITH PROPOFOL;  Surgeon: Carol Ada, MD;  [facility];  Service: Endoscopy;  Laterality: N/A;  ? GIVENS CAPSULE STUDY N/A 03/21/2016  ? Procedure: GIVENS CAPSULE STUDY;  Surgeon: Carol Ada, MD;  [facility];  Service: Endoscopy;  Laterality: N/A;  ? HAND SURGERY  1995 and 1996  ? artificial joints both hands  ? JOINT REPLACEMENT    ? LUMBAR LAMINECTOMY/DECOMPRESSION MICRODISCECTOMY N/A 02/10/2016  ? Procedure: MICROLUMBAR DECOMPRESSION L4-L5 AND L3- L4, AND EXCISION OF SYNOVIAL CYST L4-L5;  Surgeon: Susa Day, MD;  [facility];  Service: Orthopedics;  Laterality: N/A;  ? TOTAL HIP ARTHROPLASTY Left 2009  ? TOTAL HIP ARTHROPLASTY Right 04/03/2013  ? Procedure: RIGHT TOTAL HIP ARTHROPLASTY ANTERIOR APPROACH;  Surgeon: Gearlean Alf, MD;  [facility];  Service: Orthopedics;  Laterality: Right;  ? ? ?Allergies:  ?Allergies  ?Allergen Reactions  ? Promethazine   ? ? ?Family History:  ?  Family History  ?Problem Relation Age of Onset  ? Pneumonia Mother 39  ? Stroke Father 60  ? Heart failure Sister   ? Hypertension Sister   ? Heart attack Neg Hx   ? ? ?Social History:  ?Social History  ? ?Tobacco Use  ? Smoking status: Never  ? Smokeless tobacco: Never  ?Vaping Use  ? Vaping Use: Never used  ?Substance Use Topics  ? Alcohol use: No  ?  Alcohol/week: 0.0 standard drinks  ? Drug use: No  ? ? ?Review of symptoms:  ?Constitutional:  Negative for unexplained weight loss, night sweats, fever, chills ?ENT:  Negative for nose bleeds, sinus pain, painful  swallowing ?CV:  Negative for chest pain, shortness of breath, exercise intolerance, palpitations, loss of consciousness ?Resp:  Negative for cough, wheezing, shortness of breath ?GI:  Negative for nausea, vomiting, diarrhea, bloody stools ?GU:  Positives noted in HPI; otherwise negative for gross hematuria, dysuria, urinary incontinence ?Neuro:  Negative for seizures, poor balance, limb weakness, slurred speech ?Psych:  Negative for lack of energy, depression, anxiety ?Endocrine:  Negative for polydipsia, polyuria, symptoms of hypoglycemia (dizziness, hunger, sweating) ?Hematologic:  Negative for anemia, purpura, petechia, prolonged or excessive bleeding, use of anticoagulants  ?Allergic:  Negative for difficulty breathing or choking as a result of exposure to anything; no shellfish allergy; no allergic response (rash/itch) to materials, foods ? ?Physical exam: ?There were no vitals taken for this visit. ?GENERAL APPEARANCE:  Well appearing, well developed, well nourished, NAD ?HEENT: Atraumatic, Normocephalic, oropharynx clear. ?NECK: Supple without lymphadenopathy or thyromegaly. ?LUNGS: Clear to auscultation bilaterally. ?HEART: Regular Rate and Rhythm without murmurs, gallops, or rubs. ?ABDOMEN: Soft, non-tender, No Masses. ?EXTREMITIES: Moves all extremities well.  Without clubbing, cyanosis, or edema. ?NEUROLOGIC:  Alert and oriented x 3, normal gait, CN II-XII grossly intact.  ?MENTAL STATUS:  Appropriate. ?BACK:  Non-tender to palpation.  No CVAT ?SKIN:  Warm, dry and intact.   ? ?Results: ?No results found for this or any previous visit (from the past 24 hour(s)). ? ?

## 2021-07-06 ENCOUNTER — Ambulatory Visit: Payer: 59 | Admitting: Cardiology

## 2021-07-07 ENCOUNTER — Encounter: Payer: Self-pay | Admitting: Internal Medicine

## 2021-07-12 ENCOUNTER — Encounter: Payer: Self-pay | Admitting: Dermatology

## 2021-07-12 ENCOUNTER — Ambulatory Visit (INDEPENDENT_AMBULATORY_CARE_PROVIDER_SITE_OTHER): Payer: 59 | Admitting: Dermatology

## 2021-07-12 DIAGNOSIS — L219 Seborrheic dermatitis, unspecified: Secondary | ICD-10-CM | POA: Diagnosis not present

## 2021-07-12 DIAGNOSIS — L570 Actinic keratosis: Secondary | ICD-10-CM

## 2021-07-12 DIAGNOSIS — Z1283 Encounter for screening for malignant neoplasm of skin: Secondary | ICD-10-CM | POA: Diagnosis not present

## 2021-07-20 ENCOUNTER — Other Ambulatory Visit (HOSPITAL_COMMUNITY): Payer: Self-pay | Admitting: General Surgery

## 2021-07-20 ENCOUNTER — Ambulatory Visit (INDEPENDENT_AMBULATORY_CARE_PROVIDER_SITE_OTHER)
Admission: RE | Admit: 2021-07-20 | Discharge: 2021-07-20 | Disposition: A | Discharge status: Home / Self Care | Payer: 59 | Source: Ambulatory Visit | Attending: General Surgery | Admitting: General Surgery

## 2021-07-20 DIAGNOSIS — I4891 Unspecified atrial fibrillation: Secondary | ICD-10-CM | POA: Diagnosis not present

## 2021-07-20 DIAGNOSIS — E11621 Type 2 diabetes mellitus with foot ulcer: Secondary | ICD-10-CM | POA: Insufficient documentation

## 2021-07-20 DIAGNOSIS — A4181 Sepsis due to Enterococcus: Secondary | ICD-10-CM | POA: Diagnosis not present

## 2021-07-20 DIAGNOSIS — L97509 Non-pressure chronic ulcer of other part of unspecified foot with unspecified severity: Secondary | ICD-10-CM | POA: Insufficient documentation

## 2021-07-21 ENCOUNTER — Emergency Department (HOSPITAL_COMMUNITY): Payer: 59

## 2021-07-21 ENCOUNTER — Other Ambulatory Visit: Payer: Self-pay

## 2021-07-21 ENCOUNTER — Inpatient Hospital Stay (HOSPITAL_COMMUNITY)
Admission: EM | Admit: 2021-07-21 | Discharge: 2021-07-30 | DRG: 871 | Disposition: A | Discharge status: Skilled Nursing Facility | Payer: 59 | Attending: Family Medicine | Admitting: Family Medicine

## 2021-07-21 ENCOUNTER — Encounter (HOSPITAL_BASED_OUTPATIENT_CLINIC_OR_DEPARTMENT_OTHER): Payer: 59 | Attending: General Surgery | Admitting: General Surgery

## 2021-07-21 DIAGNOSIS — Z1621 Resistance to vancomycin: Secondary | ICD-10-CM | POA: Diagnosis present

## 2021-07-21 DIAGNOSIS — Z66 Do not resuscitate: Secondary | ICD-10-CM | POA: Diagnosis present

## 2021-07-21 DIAGNOSIS — N183 Chronic kidney disease, stage 3 unspecified: Secondary | ICD-10-CM | POA: Diagnosis present

## 2021-07-21 DIAGNOSIS — Z86718 Personal history of other venous thrombosis and embolism: Secondary | ICD-10-CM

## 2021-07-21 DIAGNOSIS — N390 Urinary tract infection, site not specified: Secondary | ICD-10-CM | POA: Diagnosis present

## 2021-07-21 DIAGNOSIS — Z8673 Personal history of transient ischemic attack (TIA), and cerebral infarction without residual deficits: Secondary | ICD-10-CM | POA: Diagnosis not present

## 2021-07-21 DIAGNOSIS — Z91148 Patient's other noncompliance with medication regimen for other reason: Secondary | ICD-10-CM

## 2021-07-21 DIAGNOSIS — I130 Hypertensive heart and chronic kidney disease with heart failure and stage 1 through stage 4 chronic kidney disease, or unspecified chronic kidney disease: Secondary | ICD-10-CM | POA: Diagnosis present

## 2021-07-21 DIAGNOSIS — M069 Rheumatoid arthritis, unspecified: Secondary | ICD-10-CM | POA: Diagnosis present

## 2021-07-21 DIAGNOSIS — K219 Gastro-esophageal reflux disease without esophagitis: Secondary | ICD-10-CM | POA: Diagnosis present

## 2021-07-21 DIAGNOSIS — I4891 Unspecified atrial fibrillation: Secondary | ICD-10-CM | POA: Diagnosis present

## 2021-07-21 DIAGNOSIS — J189 Pneumonia, unspecified organism: Secondary | ICD-10-CM | POA: Diagnosis not present

## 2021-07-21 DIAGNOSIS — L03116 Cellulitis of left lower limb: Secondary | ICD-10-CM | POA: Diagnosis present

## 2021-07-21 DIAGNOSIS — I480 Paroxysmal atrial fibrillation: Secondary | ICD-10-CM | POA: Diagnosis present

## 2021-07-21 DIAGNOSIS — I5032 Chronic diastolic (congestive) heart failure: Secondary | ICD-10-CM | POA: Diagnosis present

## 2021-07-21 DIAGNOSIS — E1122 Type 2 diabetes mellitus with diabetic chronic kidney disease: Secondary | ICD-10-CM | POA: Diagnosis present

## 2021-07-21 DIAGNOSIS — F03911 Unspecified dementia, unspecified severity, with agitation: Secondary | ICD-10-CM | POA: Diagnosis not present

## 2021-07-21 DIAGNOSIS — Z9841 Cataract extraction status, right eye: Secondary | ICD-10-CM

## 2021-07-21 DIAGNOSIS — Z20822 Contact with and (suspected) exposure to covid-19: Secondary | ICD-10-CM | POA: Diagnosis present

## 2021-07-21 DIAGNOSIS — E785 Hyperlipidemia, unspecified: Secondary | ICD-10-CM | POA: Diagnosis present

## 2021-07-21 DIAGNOSIS — D631 Anemia in chronic kidney disease: Secondary | ICD-10-CM | POA: Diagnosis present

## 2021-07-21 DIAGNOSIS — F02811 Dementia in other diseases classified elsewhere, unspecified severity, with agitation: Secondary | ICD-10-CM | POA: Diagnosis present

## 2021-07-21 DIAGNOSIS — Z823 Family history of stroke: Secondary | ICD-10-CM

## 2021-07-21 DIAGNOSIS — N1831 Chronic kidney disease, stage 3a: Secondary | ICD-10-CM | POA: Diagnosis present

## 2021-07-21 DIAGNOSIS — A419 Sepsis, unspecified organism: Secondary | ICD-10-CM

## 2021-07-21 DIAGNOSIS — E876 Hypokalemia: Secondary | ICD-10-CM | POA: Diagnosis present

## 2021-07-21 DIAGNOSIS — F01B11 Vascular dementia, moderate, with agitation: Secondary | ICD-10-CM | POA: Diagnosis not present

## 2021-07-21 DIAGNOSIS — Z794 Long term (current) use of insulin: Secondary | ICD-10-CM

## 2021-07-21 DIAGNOSIS — Z7952 Long term (current) use of systemic steroids: Secondary | ICD-10-CM

## 2021-07-21 DIAGNOSIS — L03032 Cellulitis of left toe: Secondary | ICD-10-CM | POA: Diagnosis not present

## 2021-07-21 DIAGNOSIS — R6510 Systemic inflammatory response syndrome (SIRS) of non-infectious origin without acute organ dysfunction: Secondary | ICD-10-CM

## 2021-07-21 DIAGNOSIS — J690 Pneumonitis due to inhalation of food and vomit: Secondary | ICD-10-CM | POA: Diagnosis not present

## 2021-07-21 DIAGNOSIS — Z9842 Cataract extraction status, left eye: Secondary | ICD-10-CM

## 2021-07-21 DIAGNOSIS — L02612 Cutaneous abscess of left foot: Secondary | ICD-10-CM | POA: Diagnosis not present

## 2021-07-21 DIAGNOSIS — E1151 Type 2 diabetes mellitus with diabetic peripheral angiopathy without gangrene: Secondary | ICD-10-CM | POA: Diagnosis present

## 2021-07-21 DIAGNOSIS — Z7901 Long term (current) use of anticoagulants: Secondary | ICD-10-CM

## 2021-07-21 DIAGNOSIS — Y95 Nosocomial condition: Secondary | ICD-10-CM | POA: Diagnosis not present

## 2021-07-21 DIAGNOSIS — Z7984 Long term (current) use of oral hypoglycemic drugs: Secondary | ICD-10-CM

## 2021-07-21 DIAGNOSIS — Z8249 Family history of ischemic heart disease and other diseases of the circulatory system: Secondary | ICD-10-CM

## 2021-07-21 DIAGNOSIS — Z888 Allergy status to other drugs, medicaments and biological substances status: Secondary | ICD-10-CM

## 2021-07-21 DIAGNOSIS — A4181 Sepsis due to Enterococcus: Principal | ICD-10-CM | POA: Diagnosis present

## 2021-07-21 DIAGNOSIS — Z96643 Presence of artificial hip joint, bilateral: Secondary | ICD-10-CM | POA: Diagnosis present

## 2021-07-21 DIAGNOSIS — Z79899 Other long term (current) drug therapy: Secondary | ICD-10-CM

## 2021-07-21 LAB — PROTIME-INR
INR: 1.4 — ABNORMAL HIGH (ref 0.8–1.2)
Prothrombin Time: 17.4 seconds — ABNORMAL HIGH (ref 11.4–15.2)

## 2021-07-21 LAB — CBC WITH DIFFERENTIAL/PLATELET
Abs Immature Granulocytes: 0.09 10*3/uL — ABNORMAL HIGH (ref 0.00–0.07)
Basophils Absolute: 0.1 10*3/uL (ref 0.0–0.1)
Basophils Relative: 1 %
Eosinophils Absolute: 0.2 10*3/uL (ref 0.0–0.5)
Eosinophils Relative: 2 %
HCT: 34.8 % — ABNORMAL LOW (ref 36.0–46.0)
Hemoglobin: 10.1 g/dL — ABNORMAL LOW (ref 12.0–15.0)
Immature Granulocytes: 1 %
Lymphocytes Relative: 15 %
Lymphs Abs: 1.6 10*3/uL (ref 0.7–4.0)
MCH: 24.4 pg — ABNORMAL LOW (ref 26.0–34.0)
MCHC: 29 g/dL — ABNORMAL LOW (ref 30.0–36.0)
MCV: 84.1 fL (ref 80.0–100.0)
Monocytes Absolute: 1 10*3/uL (ref 0.1–1.0)
Monocytes Relative: 9 %
Neutro Abs: 7.7 10*3/uL (ref 1.7–7.7)
Neutrophils Relative %: 72 %
Platelets: 446 10*3/uL — ABNORMAL HIGH (ref 150–400)
RBC: 4.14 MIL/uL (ref 3.87–5.11)
RDW: 19.2 % — ABNORMAL HIGH (ref 11.5–15.5)
WBC: 10.5 10*3/uL (ref 4.0–10.5)
nRBC: 0 % (ref 0.0–0.2)

## 2021-07-21 LAB — COMPREHENSIVE METABOLIC PANEL
ALT: 21 U/L (ref 0–44)
AST: 29 U/L (ref 15–41)
Albumin: 2.2 g/dL — ABNORMAL LOW (ref 3.5–5.0)
Alkaline Phosphatase: 277 U/L — ABNORMAL HIGH (ref 38–126)
Anion gap: 14 (ref 5–15)
BUN: 18 mg/dL (ref 8–23)
CO2: 23 mmol/L (ref 22–32)
Calcium: 8.8 mg/dL — ABNORMAL LOW (ref 8.9–10.3)
Chloride: 104 mmol/L (ref 98–111)
Creatinine, Ser: 0.9 mg/dL (ref 0.44–1.00)
GFR, Estimated: >60 mL/min (ref >60)
Glucose, Bld: 146 mg/dL — ABNORMAL HIGH (ref 70–99)
Potassium: 3.6 mmol/L (ref 3.5–5.1)
Sodium: 141 mmol/L (ref 135–145)
Total Bilirubin: 1.1 mg/dL (ref 0.3–1.2)
Total Protein: 6.6 g/dL (ref 6.5–8.1)

## 2021-07-21 LAB — APTT
aPTT: 35 seconds (ref 24–36)
aPTT: 36 seconds (ref 24–36)

## 2021-07-21 LAB — MAGNESIUM: Magnesium: 1.4 mg/dL — ABNORMAL LOW (ref 1.7–2.4)

## 2021-07-21 LAB — TROPONIN I (HIGH SENSITIVITY)
Troponin I (High Sensitivity): 13 ng/L (ref <18)
Troponin I (High Sensitivity): 11 ng/L (ref <18)

## 2021-07-21 LAB — GLUCOSE, CAPILLARY: Glucose-Capillary: 138 mg/dL — ABNORMAL HIGH (ref 70–99)

## 2021-07-21 LAB — RESP PANEL BY RT-PCR (FLU A&B, COVID) ARPGX2
Influenza A by PCR: NEGATIVE
Influenza B by PCR: NEGATIVE
SARS Coronavirus 2 by RT PCR: NEGATIVE

## 2021-07-21 LAB — LIPASE, BLOOD: Lipase: 43 U/L (ref 11–51)

## 2021-07-21 LAB — BRAIN NATRIURETIC PEPTIDE: B Natriuretic Peptide: 182.7 pg/mL — ABNORMAL HIGH (ref 0.0–100.0)

## 2021-07-21 LAB — TSH: TSH: 1.096 u[IU]/mL (ref 0.350–4.500)

## 2021-07-21 LAB — CBG MONITORING, ED: Glucose-Capillary: 114 mg/dL — ABNORMAL HIGH (ref 70–99)

## 2021-07-21 MED ORDER — METOPROLOL TARTRATE 25 MG PO TABS: 100.0000 mg | ORAL_TABLET | Freq: Two times a day (BID) | ORAL | Status: DC

## 2021-07-21 MED ORDER — FERROUS SULFATE 325 (65 FE) MG PO TABS
325.0000 mg | ORAL_TABLET | Freq: Three times a day (TID) | ORAL | Status: DC
  Administered 2021-07-22 – 2021-07-29 (×18): 325 mg via ORAL
  Filled 2021-07-21 (×20): qty 1

## 2021-07-21 MED ORDER — HEPARIN (PORCINE) 25000 UT/250ML-% IV SOLN: 1000.0000 [IU]/h | INTRAVENOUS | Status: DC

## 2021-07-21 MED ORDER — MAGNESIUM SULFATE 2 GM/50ML IV SOLN
2.0000 g | Freq: Once | INTRAVENOUS | Status: AC
  Administered 2021-07-21: 2 g via INTRAVENOUS
  Filled 2021-07-21: qty 50

## 2021-07-21 MED ORDER — BUPROPION HCL ER (SR) 100 MG PO TB12
100.0000 mg | ORAL_TABLET | Freq: Two times a day (BID) | ORAL | Status: DC
  Administered 2021-07-22 – 2021-07-30 (×13): 100 mg via ORAL
  Filled 2021-07-21 (×19): qty 1

## 2021-07-21 MED ORDER — SENNA 8.6 MG PO TABS
1.0000 | ORAL_TABLET | Freq: Every day | ORAL | Status: DC
  Administered 2021-07-21 – 2021-07-28 (×8): 8.6 mg via ORAL
  Filled 2021-07-21 (×8): qty 1

## 2021-07-21 MED ORDER — POLYVINYL ALCOHOL 1.4 % OP SOLN
2.0000 [drp] | Freq: Every morning | OPHTHALMIC | Status: DC
  Administered 2021-07-23 – 2021-07-30 (×6): 2 [drp] via OPHTHALMIC
  Filled 2021-07-21: qty 15

## 2021-07-21 MED ORDER — MOMETASONE FURO-FORMOTEROL FUM 200-5 MCG/ACT IN AERO
2.0000 | INHALATION_SPRAY | Freq: Two times a day (BID) | RESPIRATORY_TRACT | Status: DC
  Administered 2021-07-22 – 2021-07-30 (×11): 2 via RESPIRATORY_TRACT
  Filled 2021-07-21 (×2): qty 8.8

## 2021-07-21 MED ORDER — SACCHAROMYCES BOULARDII 250 MG PO CAPS
250.0000 mg | ORAL_CAPSULE | Freq: Two times a day (BID) | ORAL | Status: DC
  Administered 2021-07-22 – 2021-07-30 (×16): 250 mg via ORAL
  Filled 2021-07-21 (×19): qty 1

## 2021-07-21 MED ORDER — KETOTIFEN FUMARATE 0.025 % OP SOLN
1.0000 [drp] | Freq: Two times a day (BID) | OPHTHALMIC | Status: DC
  Administered 2021-07-22 – 2021-07-30 (×14): 1 [drp] via OPHTHALMIC
  Filled 2021-07-21: qty 5

## 2021-07-21 MED ORDER — ATORVASTATIN CALCIUM 10 MG PO TABS
20.0000 mg | ORAL_TABLET | Freq: Every day | ORAL | Status: DC
  Administered 2021-07-22 – 2021-07-28 (×7): 20 mg via ORAL
  Filled 2021-07-21 (×9): qty 2

## 2021-07-21 MED ORDER — LORATADINE 10 MG PO TABS
10.0000 mg | ORAL_TABLET | Freq: Every day | ORAL | Status: DC
  Administered 2021-07-22 – 2021-07-29 (×7): 10 mg via ORAL
  Filled 2021-07-21 (×8): qty 1

## 2021-07-21 MED ORDER — RIVAROXABAN 10 MG PO TABS: 20.0000 mg | ORAL_TABLET | Freq: Every day | ORAL | Status: DC

## 2021-07-21 MED ORDER — INSULIN GLARGINE-YFGN 100 UNIT/ML ~~LOC~~ SOLN
30.0000 [IU] | Freq: Every day | SUBCUTANEOUS | Status: DC
  Administered 2021-07-21 – 2021-07-25 (×4): 30 [IU] via SUBCUTANEOUS
  Filled 2021-07-21 (×6): qty 0.3

## 2021-07-21 MED ORDER — INSULIN ASPART 100 UNIT/ML IJ SOLN
0.0000 [IU] | Freq: Three times a day (TID) | INTRAMUSCULAR | Status: DC
  Administered 2021-07-22 – 2021-07-23 (×2): 3 [IU] via SUBCUTANEOUS
  Administered 2021-07-29 (×2): 2 [IU] via SUBCUTANEOUS

## 2021-07-21 MED ORDER — METOPROLOL TARTRATE 50 MG PO TABS
50.0000 mg | ORAL_TABLET | Freq: Once | ORAL | Status: DC
  Filled 2021-07-21: qty 2

## 2021-07-21 MED ORDER — METOPROLOL TARTRATE 5 MG/5ML IV SOLN
5.0000 mg | Freq: Once | INTRAVENOUS | Status: AC
  Administered 2021-07-21: 5 mg via INTRAVENOUS
  Filled 2021-07-21: qty 5

## 2021-07-21 MED ORDER — OMEPRAZOLE MAGNESIUM 20 MG PO TBEC: 20.0000 mg | DELAYED_RELEASE_TABLET | Freq: Every day | ORAL | Status: DC

## 2021-07-21 MED ORDER — SODIUM CHLORIDE 0.9 % IV BOLUS
1000.0000 mL | Freq: Once | INTRAVENOUS | Status: AC
  Administered 2021-07-21: 1000 mL via INTRAVENOUS

## 2021-07-21 MED ORDER — HEPARIN (PORCINE) 25000 UT/250ML-% IV SOLN
1400.0000 [IU]/h | INTRAVENOUS | Status: DC
  Administered 2021-07-21: 1000 [IU]/h via INTRAVENOUS
  Administered 2021-07-22 – 2021-07-23 (×2): 1400 [IU]/h via INTRAVENOUS
  Filled 2021-07-21 (×3): qty 250

## 2021-07-21 MED ORDER — DICLOFENAC SODIUM 1 % EX GEL
2.0000 g | Freq: Every day | CUTANEOUS | Status: DC | PRN
  Filled 2021-07-21: qty 100

## 2021-07-21 MED ORDER — METFORMIN HCL 500 MG PO TABS
1000.0000 mg | ORAL_TABLET | Freq: Two times a day (BID) | ORAL | Status: DC
  Administered 2021-07-22 – 2021-07-30 (×7): 1000 mg via ORAL
  Filled 2021-07-21 (×14): qty 2

## 2021-07-21 MED ORDER — DILTIAZEM HCL-DEXTROSE 125-5 MG/125ML-% IV SOLN (PREMIX)
5.0000 mg/h | INTRAVENOUS | Status: DC
  Administered 2021-07-21: 5 mg/h via INTRAVENOUS
  Administered 2021-07-22: 15 mg/h via INTRAVENOUS
  Administered 2021-07-22: 10 mg/h via INTRAVENOUS
  Administered 2021-07-22: 15 mg/h via INTRAVENOUS
  Administered 2021-07-23 – 2021-07-24 (×2): 10 mg/h via INTRAVENOUS
  Administered 2021-07-25 – 2021-07-27 (×6): 15 mg/h via INTRAVENOUS
  Filled 2021-07-21 (×14): qty 125

## 2021-07-21 MED ORDER — AQUAPHOR ADV THERAPY HEALING EX OINT: 1.0000 "application " | TOPICAL_OINTMENT | CUTANEOUS | Status: DC

## 2021-07-21 MED ORDER — MAGNESIUM OXIDE -MG SUPPLEMENT 400 (240 MG) MG PO TABS
400.0000 mg | ORAL_TABLET | Freq: Two times a day (BID) | ORAL | Status: DC
  Administered 2021-07-22 – 2021-07-30 (×15): 400 mg via ORAL
  Filled 2021-07-21 (×18): qty 1

## 2021-07-21 MED ORDER — ICOSAPENT ETHYL 1 G PO CAPS
1.0000 g | ORAL_CAPSULE | Freq: Every day | ORAL | Status: DC
  Administered 2021-07-22 – 2021-07-29 (×7): 1 g via ORAL
  Filled 2021-07-21 (×9): qty 1

## 2021-07-21 MED ORDER — DILTIAZEM LOAD VIA INFUSION
10.0000 mg | Freq: Once | INTRAVENOUS | Status: AC
  Administered 2021-07-21: 10 mg via INTRAVENOUS
  Filled 2021-07-21: qty 10

## 2021-07-21 MED ORDER — METOPROLOL TARTRATE 5 MG/5ML IV SOLN: 2.5000 mg | INTRAVENOUS | Status: DC | PRN

## 2021-07-21 MED ORDER — OXYCODONE HCL 5 MG PO TABS
7.5000 mg | ORAL_TABLET | Freq: Three times a day (TID) | ORAL | Status: DC | PRN
  Administered 2021-07-22 – 2021-07-28 (×6): 7.5 mg via ORAL
  Filled 2021-07-21 (×6): qty 2

## 2021-07-21 MED ORDER — CYCLOSPORINE 0.05 % OP EMUL
1.0000 [drp] | Freq: Two times a day (BID) | OPHTHALMIC | Status: DC
  Administered 2021-07-22 – 2021-07-30 (×15): 1 [drp] via OPHTHALMIC
  Filled 2021-07-21 (×19): qty 30

## 2021-07-21 MED ORDER — PANTOPRAZOLE SODIUM 20 MG PO TBEC
20.0000 mg | DELAYED_RELEASE_TABLET | Freq: Every day | ORAL | Status: DC
  Administered 2021-07-22 – 2021-07-30 (×8): 20 mg via ORAL
  Filled 2021-07-21 (×10): qty 1

## 2021-07-21 MED ORDER — PREDNISONE 5 MG PO TABS
5.0000 mg | ORAL_TABLET | Freq: Every day | ORAL | Status: DC
  Administered 2021-07-22 – 2021-07-30 (×8): 5 mg via ORAL
  Filled 2021-07-21 (×10): qty 1

## 2021-07-21 MED ORDER — GABAPENTIN 300 MG PO CAPS
300.0000 mg | ORAL_CAPSULE | Freq: Three times a day (TID) | ORAL | Status: DC
  Administered 2021-07-21 – 2021-07-29 (×22): 300 mg via ORAL
  Filled 2021-07-21 (×23): qty 1

## 2021-07-21 MED ORDER — POTASSIUM CHLORIDE CRYS ER 20 MEQ PO TBCR
40.0000 meq | EXTENDED_RELEASE_TABLET | Freq: Once | ORAL | Status: DC
  Filled 2021-07-21: qty 2

## 2021-07-21 NOTE — Progress Notes (Signed)
Holian, Umaima L. ([id number]) ?Visit Report for 07/21/2021 ?SuperBill Details ?Patient Name: Date of Service: ?Hal[place], PA TSY L. 07/21/2021 ?Medical Record Number: [id number] ?Patient Account Number: [id number] ?Date of Birth/Sex: Treating RN: ?1945-06-19 (76 y.o. Benjamine Sprague, [place] ?Primary Care Provider: Bing Matter Other Clinician: ?Referring Provider: ?Treating Provider/Extender: Fredirick Maudlin ?Bing Matter ?Weeks in Treatment: 6 ?Diagnosis Coding ?ICD-10 Codes ?Code Description ?E11.621 Type 2 diabetes mellitus with foot ulcer ?E11.622 Type 2 diabetes mellitus with other skin ulcer ?L97.526 Non-pressure chronic ulcer of other part of left foot with bone involvement without evidence of necrosis ?I73.9 Peripheral vascular disease, unspecified ?I87.311 Chronic venous hypertension (idiopathic) with ulcer of right lower extremity ?O27.035 Chronic venous hypertension (idiopathic) with ulcer of left lower extremity ?I10 Essential (primary) hypertension ?K09.38 Chronic diastolic (congestive) heart failure ?I48.20 Chronic atrial fibrillation, unspecified ?I63.9 Cerebral infarction, unspecified ?Facility Procedures ?CPT4 Code Description Modifier Quantity ?[id number] 69678 - WOUND CARE VISIT-LEV 2 EST PT 1 ?Electronic Signature(s) ?Signed: 07/21/2021 4:46:17 PM By: Fredirick Maudlin MD FACS ?Signed: 07/21/2021 5:49:13 PM By: Levan Hurst RN, BSN ?Entered By: Levan Hurst on 07/21/2021 11:59:13 ?

## 2021-07-21 NOTE — ED Provider Notes (Signed)
?[facility] ?Provider Note ? ? ?CSN: [id number] ?Arrival date & time: 07/21/21  1049 ? ?  ? ?History ? ?Chief Complaint  ?Patient presents with  ? Tachycardia  ? ? ?[name] is a 76 y.o. female. ? ? Patient as above with significant medical history as below, including paroxysmal atrial fibrillation, DOAC use, SNF resident, CHF, dementia who presents to the ED with complaint of tachycardia from wound care clinic.  EMS was called from wound clinic, patient heart rate 150s to 170s.  On EMS arrival patient found to be in A-fib with RVR.  Given Cardizem bolus '10mg'$ x2 IV.  Improvement to heart rate initially but has since increased back into the 150s 160s on arrival.  Patient is a poor historian, reports some mild nausea the past few days, palpitations, fatigue.  She has pain to her buttock region.  Denies medication changes or recent falls. ? ? ?Level 5 caveat dementia ? ? ? ?Past Medical History: ?07/24/2017: Abnormal LFTs ?No date: Actinic keratosis ?No date: Acute CVA (cerebrovascular accident) ([place]) ?05/06/2014: Acute deep vein thrombosis (DVT) of left lower extremity  ?(HCC) ?No date: Anemia ?    Comment:  hx of ?07/17/2015: Anemia of chronic renal failure, stage 3 (moderate) (HCC) ?07/17/2015: Anticoagulant causing adverse effect in therapeutic use ?No date: Anxiety ?No date: Arthritis ?    Comment:  rheumatoid ?No date: Atrial fibrillation ([place]) ?    Comment:  a. persistent, she remains on Xarelto ?No date: Carpal tunnel syndrome ?No date: Chronic diastolic (congestive) heart failure ([place]) ?    Comment:  a. 04/2016: echo showing a preserved EF of 55-60% with  ?             mild MR. LA and RA midly dilated.  ?No date: Edema ?    Comment:  left leg at ankle resolved now ?No date: Gastroesophageal reflux disease ?1970: Hepatitis ?    Comment:  not sure what kind ?No date: Hiatal hernia ?No date: Hyperlipidemia ?No date: Hypertension ?07/17/2015: Iron deficiency anemia due to  chronic blood loss ?No date: Jaundice ?    Comment:  age 26 ?yrs ago: Peripheral vascular disease ([place]) ?    Comment:  DVT left lower leg questionale told by 2 drs i had no  ?             clot, 1 md said i did ?No date: Rheumatoid arthritis(714.0) ?No date: Right knee pain ?No date: Stroke [facility]) ?No date: Type II diabetes mellitus ([place]) ?    Comment:  type2 ? ?Past Surgical History: ?No date: CATARACT EXTRACTION; Bilateral ?02/20/2015: COLONOSCOPY WITH PROPOFOL; N/A ?    Comment:  Procedure: COLONOSCOPY WITH PROPOFOL;  Surgeon: Saralyn Pilar  ?             Benson Norway, MD;  Location: [facility];  Service: Endoscopy;   ?             Laterality: N/A; ?03/23/2016: ENTEROSCOPY; N/A ?    Comment:  Procedure: ENTEROSCOPY;  Surgeon: Carol Ada, MD;   ?             [facility]: [facility];  Service: Endoscopy;  Laterality: ?             N/A; ?06/17/2016: ENTEROSCOPY; N/A ?    Comment:  Procedure: ENTEROSCOPY;  Surgeon: Carol Ada, MD;   ?             [facility];  Service: Endoscopy;  Laterality: ?  N/A; ?02/20/2015: ESOPHAGOGASTRODUODENOSCOPY (EGD) WITH PROPOFOL; N/A ?    Comment:  Procedure: ESOPHAGOGASTRODUODENOSCOPY (EGD) WITH  ?             PROPOFOL;  Surgeon: Carol Ada, MD;  [facility]  ?             ENDOSCOPY;  Service: Endoscopy;  Laterality: N/A; ?03/21/2016: GIVENS CAPSULE STUDY; N/A ?    Comment:  Procedure: GIVENS CAPSULE STUDY;  Surgeon: Carol Ada, ?             MD;  [facility];  Service: Endoscopy;   ?             Laterality: N/A; ?1995 and 1996: HAND SURGERY ?    Comment:  artificial joints both hands ?No date: JOINT REPLACEMENT ?02/10/2016: LUMBAR LAMINECTOMY/DECOMPRESSION MICRODISCECTOMY; N/A ?    Comment:  Procedure: MICROLUMBAR DECOMPRESSION L4-L5 AND L3- L4,  ?             AND EXCISION OF SYNOVIAL CYST L4-L5;  Surgeon: Dellis Filbert  ?             Tonita Cong, MD;  [facility] ORS;  Service: Orthopedics;   ?             Laterality: N/A; ?2009: TOTAL HIP ARTHROPLASTY; Left ?04/03/2013:  TOTAL HIP ARTHROPLASTY; Right ?    Comment:  Procedure: RIGHT TOTAL HIP ARTHROPLASTY ANTERIOR  ?             APPROACH;  Surgeon: Gearlean Alf, MD;  [facility]  ?             ORS;  Service: Orthopedics;  Laterality: Right;  ? ? ?The history is provided by the patient and the EMS personnel. No language interpreter was used.  ? ?  ? ?Home Medications ?Prior to Admission medications   ?Medication Sig Start Date End Date Taking? Authorizing Provider  ?acetaminophen (TYLENOL) 650 MG CR tablet Take 1,300 mg by mouth in the morning and at bedtime.   Yes [provider]  ?albuterol (VENTOLIN HFA) 108 (90 Base) MCG/ACT inhaler Inhale 2 puffs into the lungs every 4 (four) hours as needed for wheezing or shortness of breath.   Yes [provider]  ?Amino Acids-Protein Hydrolys (FEEDING SUPPLEMENT, PRO-STAT 64,) LIQD Take 30 mLs by mouth in the morning.   Yes [provider]  ?atorvastatin (LIPITOR) 20 MG tablet Take 20 mg by mouth daily. 01/27/20  Yes [provider]  ?azelastine (OPTIVAR) 0.05 % ophthalmic solution Place 1 drop into both eyes 2 (two) times daily.   Yes [provider]  ?buPROPion ER (WELLBUTRIN SR) 100 MG 12 hr tablet Take 1 tablet (100 mg total) by mouth 2 (two) times daily. 05/25/21  Yes Barton Dubois, MD  ?Calcium Carbonate-Vit D-Min (CALCIUM 1200) 1200-1000 MG-UNIT CHEW Chew 1 tablet by mouth daily.   Yes [provider]  ?carboxymethylcellulose (ARTIFICIAL TEARS) 1 % ophthalmic solution Place 2 drops into both eyes in the morning.   Yes [provider]  ?cefTRIAXone (ROCEPHIN) 1 g injection Inject 1 g into the muscle See admin instructions. 1g intramuscularly once daily for 3 days   Yes [provider]  ?cetirizine (ZYRTEC) 10 MG tablet Take 10 mg by mouth daily.   Yes [provider]  ?cycloSPORINE (RESTASIS) 0.05 % ophthalmic emulsion Place 1 drop into both eyes 2 (two) times daily.   Yes [provider]   ?dextrose 5 % solution Inject 50 mLs into the  vein continuous. 50cc/hr   Yes [provider]  ?diclofenac Sodium (VOLTAREN) 1 % GEL Apply 2 g topically daily as needed for pain. 03/15/20  Yes [provider]  ?diltiazem (CARDIZEM LA) 240 MG 24 hr tablet Take 1 tablet (240 mg total) by mouth 2 (two) times daily. 06/30/21 07/30/21 Yes Manuella Ghazi, Pratik D, DO  ?Emollient (AQUAPHOR ADV THERAPY HEALING) OINT Apply 1 application. topically See admin instructions. To rash on face in the evening every Monday, Thursday   Yes [provider]  ?Eyelid Cleansers (OCUSOFT LID SCRUB ORIGINAL) PADS Apply 1 application. topically in the morning and at bedtime. Both eyelids   Yes [provider]  ?ferrous sulfate 325 (65 FE) MG EC tablet Take 325 mg by mouth 3 (three) times daily with meals.   Yes [provider]  ?fluticasone-salmeterol (ADVAIR HFA) 115-21 MCG/ACT inhaler Inhale 2 puffs into the lungs 2 times daily at 12 noon and 4 pm.   Yes [provider]  ?furosemide (LASIX) 20 MG tablet Take 20 mg by mouth daily as needed for edema.   Yes [provider]  ?gabapentin (NEURONTIN) 300 MG capsule Take 300 mg by mouth 3 (three) times daily.   Yes [provider]  ?insulin glargine (LANTUS) 100 UNIT/ML injection Inject 30 Units into the skin daily. Hold if BS is less than 150   Yes [provider]  ?insulin lispro (HUMALOG) 100 UNIT/ML injection Inject 6 Units into the skin in the morning and at bedtime. Hold if BS <100   Yes [provider]  ?magnesium oxide (MAG-OX) 400 MG tablet Take 400 mg by mouth 2 (two) times daily.   Yes [provider]  ?Menthol-Zinc Oxide (CALMOSEPTINE) 0.44-20.6 % OINT Apply 1 application. topically every 8 (eight) hours as needed (protection/prevention).   Yes [provider]  ?metFORMIN (GLUCOPHAGE) 500 MG tablet Take 1,000 mg by mouth 2 (two) times daily.   Yes [provider]  ?metoprolol  tartrate (LOPRESSOR) 100 MG tablet Take 1 tablet (100 mg total) by mouth 2 (two) times daily. 05/25/21  Yes Barton Dubois, MD  ?Multiple Vitamin (MULTIVITAMIN) tablet Take 1 tablet by mouth daily.   Yes Provider,

## 2021-07-21 NOTE — H&P (Signed)
?History and Physical  ? ? ?[name] STM:[id number] DOB: 27-May-1945 DOA: 07/21/2021 ? ?PCP: Aletha Halim., PA-C (Confirm with patient/family/NH records and if not entered, this has to be entered at [facility] point of entry) ?Patient coming from: SNF ? ?I have personally briefly reviewed patient's old medical records in [name] ? ?Chief Complaint: Why am I here? ? ?HPI: [name] is a 76 y.o. female with medical history significant of advanced dementia, PAF on Xarelto, RA on chronic steroid, IDDM, HTN, chronic diastolic CHF, anemia secondary to CKD, CKD stage IIIa, chronic pressure injury of skin right foot, sent from wound care center for rapid A-fib. ? ?Reportedly, patient has been spitting out her Cardizem and metoprolol pills at nursing home.  This afternoon, while at wound care center for routine visit, it was found patient heart rate in the 170s and patient sent to ED.  Patient has no specific complaints, denies any chest pain shortness of breath or palpitations. ? ?ED Course: Heart rate in the 150s, responded to Cardizem drip, Steinmet[place] with patient heart rate is in 100s, BP stable.  K3.6, magnesium 1.4, received 2 g of magnesium in ED. ? ?Review of Systems: Unable to perform, baseline dementia. ? ?Past Medical History:  ?Diagnosis Date  ? Abnormal LFTs 07/24/2017  ? Actinic keratosis   ? Acute CVA (cerebrovascular accident) ([place])   ? Acute deep vein thrombosis (DVT) of left lower extremity ([place]) 05/06/2014  ? Anemia   ? hx of  ? Anemia of chronic renal failure, stage 3 (moderate) ([place]) 07/17/2015  ? Anticoagulant causing adverse effect in therapeutic use 07/17/2015  ? Anxiety   ? Arthritis   ? rheumatoid  ? Atrial fibrillation ([place])   ? a. persistent, she remains on Xarelto  ? Carpal tunnel syndrome   ? Chronic diastolic (congestive) heart failure (HCC)   ? a. 04/2016: echo showing a preserved EF of 55-60% with mild MR. LA and RA midly dilated.   ? Edema   ? left leg at ankle resolved now  ?  Gastroesophageal reflux disease   ? Hepatitis 1970  ? not sure what kind  ? Hiatal hernia   ? Hyperlipidemia   ? Hypertension   ? Iron deficiency anemia due to chronic blood loss 07/17/2015  ? Jaundice   ? age 61  ? Peripheral vascular disease ([place]) yrs ago  ? DVT left lower leg questionale told by 2 drs i had no clot, 1 md said i did  ? Rheumatoid arthritis(714.0)   ? Right knee pain   ? Stroke [facility])   ? Type II diabetes mellitus ([name])   ? type2  ? ? ?Past Surgical History:  ?Procedure Laterality Date  ? CATARACT EXTRACTION Bilateral   ? COLONOSCOPY WITH PROPOFOL N/A 02/20/2015  ? Procedure: COLONOSCOPY WITH PROPOFOL;  Surgeon: Carol Ada, MD;  [facility];  Service: Endoscopy;  Laterality: N/A;  ? ENTEROSCOPY N/A 03/23/2016  ? Procedure: ENTEROSCOPY;  Surgeon: Carol Ada, MD;  [facility];  Service: Endoscopy;  Laterality: N/A;  ? ENTEROSCOPY N/A 06/17/2016  ? Procedure: ENTEROSCOPY;  Surgeon: Carol Ada, MD;  [facility];  Service: Endoscopy;  Laterality: N/A;  ? ESOPHAGOGASTRODUODENOSCOPY (EGD) WITH PROPOFOL N/A 02/20/2015  ? Procedure: ESOPHAGOGASTRODUODENOSCOPY (EGD) WITH PROPOFOL;  Surgeon: Carol Ada, MD;  [facility];  Service: Endoscopy;  Laterality: N/A;  ? GIVENS CAPSULE STUDY N/A 03/21/2016  ? Procedure: GIVENS CAPSULE STUDY;  Surgeon: Carol Ada, MD;  [facility];  Service: Endoscopy;  Laterality: N/A;  ? HAND SURGERY  1995 and 1996  ? artificial joints both hands  ? JOINT REPLACEMENT    ? LUMBAR LAMINECTOMY/DECOMPRESSION MICRODISCECTOMY N/A 02/10/2016  ? Procedure: MICROLUMBAR DECOMPRESSION L4-L5 AND L3- L4, AND EXCISION OF SYNOVIAL CYST L4-L5;  Surgeon: Susa Day, MD;  [facility]S;  Service: Orthopedics;  Laterality: N/A;  ? TOTAL HIP ARTHROPLASTY Left 2009  ? TOTAL HIP ARTHROPLASTY Right 04/03/2013  ? Procedure: RIGHT TOTAL HIP ARTHROPLASTY ANTERIOR APPROACH;  Surgeon: Gearlean Alf, MD;  [facility];  Service: Orthopedics;   Laterality: Right;  ? ? ? reports that she has never smoked. She has never used smokeless tobacco. She reports that she does not drink alcohol and does not use drugs. ? ?Allergies  ?Allergen Reactions  ? Promethazine Other (See Comments)  ?  Not listed on MAR, unknown reaction  ? ? ?Family History  ?Problem Relation Age of Onset  ? Pneumonia Mother 70  ? Stroke Father 68  ? Heart failure Sister   ? Hypertension Sister   ? Heart attack Neg Hx   ? ? ?Prior to Admission medications   ?Medication Sig Start Date End Date Taking? Authorizing Provider  ?acetaminophen (TYLENOL) 650 MG CR tablet Take 1,300 mg by mouth in the morning and at bedtime.   Yes [provider]  ?albuterol (VENTOLIN HFA) 108 (90 Base) MCG/ACT inhaler Inhale 2 puffs into the lungs every 4 (four) hours as needed for wheezing or shortness of breath.   Yes [provider]  ?Amino Acids-Protein Hydrolys (FEEDING SUPPLEMENT, PRO-STAT 64,) LIQD Take 30 mLs by mouth in the morning.   Yes [provider]  ?atorvastatin (LIPITOR) 20 MG tablet Take 20 mg by mouth daily. 01/27/20  Yes [provider]  ?azelastine (OPTIVAR) 0.05 % ophthalmic solution Place 1 drop into both eyes 2 (two) times daily.   Yes [provider]  ?buPROPion ER (WELLBUTRIN SR) 100 MG 12 hr tablet Take 1 tablet (100 mg total) by mouth 2 (two) times daily. 05/25/21  Yes Barton Dubois, MD  ?Calcium Carbonate-Vit D-Min (CALCIUM 1200) 1200-1000 MG-UNIT CHEW Chew 1 tablet by mouth daily.   Yes [provider]  ?carboxymethylcellulose (ARTIFICIAL TEARS) 1 % ophthalmic solution Place 2 drops into both eyes in the morning.   Yes [provider]  ?cefTRIAXone (ROCEPHIN) 1 g injection Inject 1 g into the muscle See admin instructions. 1g intramuscularly once daily for 3 days   Yes [provider]  ?cetirizine (ZYRTEC) 10 MG tablet Take 10 mg by mouth daily.   Yes [provider]  ?cycloSPORINE (RESTASIS) 0.05 %  ophthalmic emulsion Place 1 drop into both eyes 2 (two) times daily.   Yes [provider]  ?dextrose 5 % solution Inject 50 mLs into the vein continuous. 50cc/hr   Yes [provider]  ?diclofenac Sodium (VOLTAREN) 1 % GEL Apply 2 g topically daily as needed for pain. 03/15/20  Yes [provider]  ?diltiazem (CARDIZEM LA) 240 MG 24 hr tablet Take 1 tablet (240 mg total) by mouth 2 (two) times daily. 06/30/21 07/30/21 Yes Manuella Ghazi, Pratik D, DO  ?Emollient (AQUAPHOR ADV THERAPY HEALING) OINT Apply 1 application. topically See admin instructions. To rash on face in the evening every Monday, Thursday   Yes [provider]  ?Eyelid Cleansers (OCUSOFT LID SCRUB ORIGINAL) PADS Apply 1 application. topically in the morning and at bedtime. Both eyelids   Yes [provider]  ?ferrous sulfate 325 (  65 FE) MG EC tablet Take 325 mg by mouth 3 (three) times daily with meals.   Yes [provider]  ?fluticasone-salmeterol (ADVAIR HFA) 115-21 MCG/ACT inhaler Inhale 2 puffs into the lungs 2 times daily at 12 noon and 4 pm.   Yes [provider]  ?furosemide (LASIX) 20 MG tablet Take 20 mg by mouth daily as needed for edema.   Yes [provider]  ?gabapentin (NEURONTIN) 300 MG capsule Take 300 mg by mouth 3 (three) times daily.   Yes [provider]  ?insulin glargine (LANTUS) 100 UNIT/ML injection Inject 30 Units into the skin daily. Hold if BS is less than 150   Yes [provider]  ?insulin lispro (HUMALOG) 100 UNIT/ML injection Inject 6 Units into the skin in the morning and at bedtime. Hold if BS <100   Yes [provider]  ?magnesium oxide (MAG-OX) 400 MG tablet Take 400 mg by mouth 2 (two) times daily.   Yes [provider]  ?Menthol-Zinc Oxide (CALMOSEPTINE) 0.44-20.6 % OINT Apply 1 application. topically every 8 (eight) hours as needed (protection/prevention).   Yes [provider]  ?metFORMIN (GLUCOPHAGE) 500  MG tablet Take 1,000 mg by mouth 2 (two) times daily.   Yes [provider]  ?metoprolol tartrate (LOPRESSOR) 100 MG tablet Take 1 tablet (100 mg total) by mouth 2 (two) times daily. 05/25/21  Yes [place],

## 2021-07-21 NOTE — Progress Notes (Signed)
ANTICOAGULATION CONSULT NOTE - Initial Consult ? ?Pharmacy Consult for Heparin ?Indication: atrial fibrillation ? ?Allergies  ?Allergen Reactions  ? Promethazine Other (See Comments)  ?  Not listed on MAR, unknown reaction  ? ? ?Patient Measurements: ?Height: '5\' 7"'$  (170.2 cm) ?Weight: 63.5 kg (140 lb) ?IBW/kg (Calculated) : 61.6 ?Heparin Dosing Weight: 63.5 kg ? ?Vital Signs: ?Temp: 97.6 ?F (36.4 ?C) (04/19 1124) ?Temp Source: Axillary (04/19 1124) ?BP: 107/78 (04/19 1345) ?Pulse Rate: 116 (04/19 1345) ? ?Labs: ?Recent Labs  ?  07/21/21 ?1136  ?HGB 10.1*  ?HCT 34.8*  ?PLT 446*  ?LABPROT 17.4*  ?INR 1.4*  ?CREATININE 0.90  ?TROPONINIHS 13  ? ? ?Estimated Creatinine Clearance: 51.7 mL/min (by C-G formula based on SCr of 0.9 mg/dL). ? ? ?Medical History: ?Past Medical History:  ?Diagnosis Date  ? Abnormal LFTs 07/24/2017  ? Actinic keratosis   ? Acute CVA (cerebrovascular accident) ([place])   ? Acute deep vein thrombosis (DVT) of left lower extremity ([place]) 05/06/2014  ? Anemia   ? hx of  ? Anemia of chronic renal failure, stage 3 (moderate) ([place]) 07/17/2015  ? Anticoagulant causing adverse effect in therapeutic use 07/17/2015  ? Anxiety   ? Arthritis   ? rheumatoid  ? Atrial fibrillation ([place])   ? a. persistent, she remains on Xarelto  ? Carpal tunnel syndrome   ? Chronic diastolic (congestive) heart failure (HCC)   ? a. 04/2016: echo showing a preserved EF of 55-60% with mild MR. LA and RA midly dilated.   ? Edema   ? left leg at ankle resolved now  ? Gastroesophageal reflux disease   ? Hepatitis 1970  ? not sure what kind  ? Hiatal hernia   ? Hyperlipidemia   ? Hypertension   ? Iron deficiency anemia due to chronic blood loss 07/17/2015  ? Jaundice   ? age 76  ? Peripheral vascular disease ([place]) yrs ago  ? DVT left lower leg questionale told by 2 drs i had no clot, 1 md said i did  ? Rheumatoid arthritis(714.0)   ? Right knee pain   ? Stroke [facility])   ? Type II diabetes mellitus ([place])   ? type2  ? ? ?Medications:  ?(Not in a  hospital admission) ? ?Scheduled:  ?Infusions:  ? diltiazem (CARDIZEM) infusion 15 mg/hr (07/21/21 1306)  ? ?PRN:  ? ?Assessment: ?76 yof with a history of pAF on Xarelto, HF, dementia, CVA, DVT (2016) HTN, HLD, T2DM. Patient is presenting with tachycardia. Heparin per pharmacy consult placed for atrial fibrillation. ? ?Patient is on Xarelto prior to arrival. Last dose 4/18 at 1700. Will require aPTT monitoring due to likely falsely high anti-Xa level secondary to DOAC use. ? ?Hgb 10.1; plt 446 ?PT / INR 17.4 / 1.4 ? ?Goal of Therapy:  ?Heparin level 0.3-0.7 units/ml ?aPTT 66-102 seconds ?Monitor platelets by anticoagulation protocol: Yes ?  ?Plan:  ?No initial heparin bolus ?Start heparin infusion at 1000 units/hr at 1700 this evening ?Check aPTT & anti-Xa level at 0100 and daily while on heparin ?Continue to monitor via aPTT until levels are correlated ?Continue to monitor H&H and platelets ? ?Lorelei Pont, PharmD, BCPS ?07/21/2021 2:52 PM ?ED Clinical Pharmacist -  [phone] ? ?  ?

## 2021-07-21 NOTE — ED Notes (Addendum)
Pt spit medication out stating "Im not taking that" Christina Locks, MD has been made aware. ?

## 2021-07-21 NOTE — Progress Notes (Signed)
[name], [name] ([id number]) ?Visit Report for 07/21/2021 ?Arrival Information Details ?Patient Name: Date of Service: ?[name], [name] 07/21/2021 9:15 A M ?Medical Record Number: [id number] ?Patient Account Number: [id number] ?Date of Birth/Sex: Treating RN: ?1945/06/24 (76 y.o. [name], [name] ?Primary Care Linville Decarolis: Bing Matter Other Clinician: ?Referring Loralye Loberg: ?Treating Abdulah Iqbal/Extender: Fredirick Maudlin ?Bing Matter ?Weeks in Treatment: 6 ?Visit Information History Since Last Visit ?Added or deleted any medications: No ?Patient Arrived: Wheel Chair ?Any new allergies or adverse reactions: No ?Arrival Time: 09:25 ?Had a fall or experienced change in No ?Accompanied By: caregiver ?activities of daily living that may affect ?Transfer Assistance: Manual ?risk of falls: ?Patient Has Alerts: Yes ?Signs or symptoms of abuse/neglect since last visito No ?Patient Alerts: Patient on Blood Thinner ?Hospitalized since last visit: No ?R ABI non compressible ?Implantable device outside of the clinic excluding No ?L ABI non compressible ?cellular tissue based products placed in the center ?since last visit: ?Pain Present Now: Yes ?Electronic Signature(s) ?Signed: 07/21/2021 5:49:13 PM By: Levan Hurst RN, BSN ?Entered By: Levan Hurst on 07/21/2021 10:10:07 ?-------------------------------------------------------------------------------- ?Clinic Level of Care Assessment Details ?Patient Name: Date of Service: ?[name], [name] 07/21/2021 9:15 A M ?Medical Record Number: [id number] ?Patient Account Number: [id number] ?Date of Birth/Sex: Treating RN: ?11/16/1945 (76 y.o. [name], [name] ?Primary Care Ranell Skibinski: Bing Matter Other Clinician: ?Referring Evanell Redlich: ?Treating Heriberto Stmartin/Extender: Fredirick Maudlin ?Bing Matter ?Weeks in Treatment: 6 ?Clinic Level of Care Assessment Items ?TOOL 4 Quantity Score ?X- 1 0 ?Use when only an EandM is performed on FOLLOW-UP visit ?ASSESSMENTS - Nursing Assessment /  Reassessment ?X- 1 10 ?Reassessment of Co-morbidities (includes updates in patient status) ?X- 1 5 ?Reassessment of Adherence to Treatment Plan ?ASSESSMENTS - Wound and Skin A ssessment / Reassessment ?'[]'$  - 0 ?Simple Wound Assessment / Reassessment - one wound ?'[]'$  - 0 ?Complex Wound Assessment / Reassessment - multiple wounds ?'[]'$  - 0 ?Dermatologic / Skin Assessment (not related to wound area) ?ASSESSMENTS - Focused Assessment ?'[]'$  - 0 ?Circumferential Edema Measurements - multi extremities ?'[]'$  - 0 ?Nutritional Assessment / Counseling / Intervention ?'[]'$  - 0 ?Lower Extremity Assessment (monofilament, tuning fork, pulses) ?'[]'$  - 0 ?Peripheral Arterial Disease Assessment (using hand held doppler) ?ASSESSMENTS - Ostomy and/or Continence Assessment and Care ?'[]'$  - 0 ?Incontinence Assessment and Management ?'[]'$  - 0 ?Ostomy Care Assessment and Management (repouching, etc.) ?PROCESS - Coordination of Care ?X - Simple Patient / Family Education for ongoing care 1 15 ?'[]'$  - 0 ?Complex (extensive) Patient / Family Education for ongoing care ?'[]'$  - 0 ?Staff obtains Consents, Records, T Results / Process Orders ?est ?X- 1 10 ?Staff telephones HHA, Nursing Homes / Clarify orders / etc ?'[]'$  - 0 ?Routine Transfer to another Facility (non-emergent condition) ?'[]'$  - 0 ?Routine Hospital Admission (non-emergent condition) ?'[]'$  - 0 ?New Admissions / Biomedical engineer / Ordering NPWT Apligraf, etc. ?, ?X- 1 20 ?Emergency Hospital Admission (emergent condition) ?X- 1 10 ?Simple Discharge Coordination ?'[]'$  - 0 ?Complex (extensive) Discharge Coordination ?PROCESS - Special Needs ?'[]'$  - 0 ?Pediatric / Minor Patient Management ?'[]'$  - 0 ?Isolation Patient Management ?'[]'$  - 0 ?Hearing / Language / Visual special needs ?'[]'$  - 0 ?Assessment of Community assistance (transportation, D/C planning, etc.) ?'[]'$  - 0 ?Additional assistance / Altered mentation ?'[]'$  - 0 ?Support Surface(s) Assessment (bed, cushion, seat, etc.) ?INTERVENTIONS - Wound Cleansing /  Measurement ?'[]'$  - 0 ?Simple Wound Cleansing - one wound ?'[]'$  - 0 ?Complex Wound Cleansing - multiple wounds ?'[]'$  - 0 ?Wound Imaging (photographs -  any number of wounds) ?'[]'$  - 0 ?Wound Tracing (instead of photographs) ?'[]'$  - 0 ?Simple Wound Measurement - one wound ?'[]'$  - 0 ?Complex Wound Measurement - multiple wounds ?INTERVENTIONS - Wound Dressings ?'[]'$  - 0 ?Small Wound Dressing one or multiple wounds ?'[]'$  - 0 ?Medium Wound Dressing one or multiple wounds ?'[]'$  - 0 ?Large Wound Dressing one or multiple wounds ?'[]'$  - 0 ?Application of Medications - topical ?'[]'$  - 0 ?Application of Medications - injection ?INTERVENTIONS - Miscellaneous ?'[]'$  - 0 ?External ear exam ?'[]'$  - 0 ?Specimen Collection (cultures, biopsies, blood, body fluids, etc.) ?'[]'$  - 0 ?Specimen(s) / Culture(s) sent or taken to Lab for analysis ?'[]'$  - 0 ?Patient Transfer (multiple staff / Civil Service fast streamer / Similar devices) ?'[]'$  - 0 ?Simple Staple / Suture removal (25 or less) ?'[]'$  - 0 ?Complex Staple / Suture removal (26 or more) ?'[]'$  - 0 ?Hypo / Hyperglycemic Management (close monitor of Blood Glucose) ?'[]'$  - 0 ?Ankle / Brachial Index (ABI) - do not check if billed separately ?X- 1 5 ?Vital Signs ?Has the patient been seen at the hospital within the last three years: Yes ?Total Score: 75 ?Level Of Care: New/Established - Level 2 ?Electronic Signature(s) ?Signed: 07/21/2021 5:49:13 PM By: Levan Hurst RN, BSN ?Entered By: Levan Hurst on 07/21/2021 11:57:34 ?-------------------------------------------------------------------------------- ?Encounter Discharge Information Details ?Patient Name: Date of Service: ?[name], [name] 07/21/2021 9:15 A M ?Medical Record Number: [id number] ?Patient Account Number: [id number] ?Date of Birth/Sex: Treating RN: ?1945-07-02 (76 y.o. [name], [name] ?Primary Care Shone Leventhal: Bing Matter Other Clinician: ?Referring Lak[place] Schweppe: ?Treating Tawnia Schirm/Extender: Fredirick Maudlin ?Bing Matter ?Weeks in Treatment: 6 ?Encounter Discharge  Information Items ?Discharge Condition: Stable ?Ambulatory Status: Stretcher ?Discharge Destination: Emergency Room ?Telephoned: No ?Orders Sent: No ?Transportation: Ambulance ?Accompanied By: EMS transport ?Schedule Follow-up Appointment: Yes ?Clinical Summary of Care: Patient Declined ?Electronic Signature(s) ?Signed: 07/21/2021 5:49:13 PM By: Levan Hurst RN, BSN ?Entered By: Levan Hurst on 07/21/2021 11:56:14 ?-------------------------------------------------------------------------------- ?General Visit Notes Details ?Patient Name: Date of Service: ?HERSKOWITZ, [name] 07/21/2021 9:15 A M ?Medical Record Number: [id number] ?Patient Account Number: [id number] ?Date of Birth/Sex: Treating RN: ?09-02-1945 (76 y.o. [name], [name] ?Primary Care Deano Tomaszewski: Bing Matter Other Clinician: ?Referring Sharonda Llamas: ?Treating Justen Fonda/Extender: Fredirick Maudlin ?Bing Matter ?Weeks in Treatment: 6 ?Notes ?Patient alert but not oriented to person, place or time on arrival. Heart rate irregular and fluctuating between 150s-180s on auscultation, difficult to palpate radial ?pulse. Caregiver from [facility] that accompanied pt states that patient has not been eating for the past couple of days and was told by other SNF staff ?that patient "has been out of it" this morning. Patient recently discharged from hospital with Bacteremia, dehydration and A-Fib with RVR. I spoke with Dr. Marland KitchenCannon regarding pt status, Dr. Celine Ahr ordered for patient to be sent to ER for evaluation. 911 called for transport to hospital. ?Electronic Signature(s) ?Signed: 07/21/2021 5:49:13 PM By: Levan Hurst RN, BSN ?Entered By: Levan Hurst on 07/21/2021 11:25:17 ?-------------------------------------------------------------------------------- ?Vitals Details ?Patient Name: Date of Service: ?PARKISON, [name] 07/21/2021 9:15 A M ?Medical Record Number: [id number] ?Patient Account Number: [id number] ?Date of Birth/Sex: Treating RN: ?Aug 02, 1945 (76 y.o.  [name], [name] ?Primary Care Elouise Divelbiss: Bing Matter Other Clinician: ?Referring Glora Hulgan: ?Treating Laveyah Oriol/Extender: Fredirick Maudlin ?Bing Matter ?Weeks in Treatment: 6 ?Vital Signs ?Time Taken: 09:25 ?[employer]

## 2021-07-21 NOTE — ED Notes (Signed)
Multiple attempts by staff to in and out pt for urine sample, pt highly combative and swatting at staff and threatening them.  ?Unable to get urine provider aware.  ?

## 2021-07-21 NOTE — ED Triage Notes (Signed)
Pt arrived by EMS from wound care clinic for  ?A.fib rvr and fatigue.  ? ?110/80, 170 HR.  ?500cc  NS and0 '20mg'$  Cardizem ? ?Pt lived at [facility]   ?

## 2021-07-22 ENCOUNTER — Ambulatory Visit: Payer: 59 | Admitting: Urology

## 2021-07-22 ENCOUNTER — Inpatient Hospital Stay (HOSPITAL_COMMUNITY): Payer: 59

## 2021-07-22 DIAGNOSIS — N1831 Chronic kidney disease, stage 3a: Secondary | ICD-10-CM

## 2021-07-22 DIAGNOSIS — A419 Sepsis, unspecified organism: Secondary | ICD-10-CM

## 2021-07-22 DIAGNOSIS — D631 Anemia in chronic kidney disease: Secondary | ICD-10-CM

## 2021-07-22 DIAGNOSIS — I480 Paroxysmal atrial fibrillation: Secondary | ICD-10-CM | POA: Diagnosis not present

## 2021-07-22 DIAGNOSIS — E876 Hypokalemia: Secondary | ICD-10-CM | POA: Diagnosis not present

## 2021-07-22 LAB — CBC
HCT: 27.4 % — ABNORMAL LOW (ref 36.0–46.0)
Hemoglobin: 8.2 g/dL — ABNORMAL LOW (ref 12.0–15.0)
MCH: 24.7 pg — ABNORMAL LOW (ref 26.0–34.0)
MCHC: 29.9 g/dL — ABNORMAL LOW (ref 30.0–36.0)
MCV: 82.5 fL (ref 80.0–100.0)
Platelets: 377 10*3/uL (ref 150–400)
RBC: 3.32 MIL/uL — ABNORMAL LOW (ref 3.87–5.11)
RDW: 19.3 % — ABNORMAL HIGH (ref 11.5–15.5)
WBC: 10.2 10*3/uL (ref 4.0–10.5)
nRBC: 0 % (ref 0.0–0.2)

## 2021-07-22 LAB — GLUCOSE, CAPILLARY
Glucose-Capillary: 88 mg/dL (ref 70–99)
Glucose-Capillary: 97 mg/dL (ref 70–99)
Glucose-Capillary: 86 mg/dL (ref 70–99)
Glucose-Capillary: 148 mg/dL — ABNORMAL HIGH (ref 70–99)
Glucose-Capillary: 186 mg/dL — ABNORMAL HIGH (ref 70–99)

## 2021-07-22 LAB — PROCALCITONIN: Procalcitonin: 0.25 ng/mL

## 2021-07-22 LAB — BASIC METABOLIC PANEL
Anion gap: 9 (ref 5–15)
BUN: 13 mg/dL (ref 8–23)
CO2: 22 mmol/L (ref 22–32)
Calcium: 7.8 mg/dL — ABNORMAL LOW (ref 8.9–10.3)
Chloride: 108 mmol/L (ref 98–111)
Creatinine, Ser: 0.69 mg/dL (ref 0.44–1.00)
GFR, Estimated: >60 mL/min (ref >60)
Glucose, Bld: 107 mg/dL — ABNORMAL HIGH (ref 70–99)
Potassium: 3.3 mmol/L — ABNORMAL LOW (ref 3.5–5.1)
Sodium: 139 mmol/L (ref 135–145)

## 2021-07-22 LAB — LACTIC ACID, PLASMA
Lactic Acid, Venous: 1.6 mmol/L (ref 0.5–1.9)
Lactic Acid, Venous: 2 mmol/L (ref 0.5–1.9)
Lactic Acid, Venous: 3.1 mmol/L (ref 0.5–1.9)

## 2021-07-22 LAB — HEPARIN LEVEL (UNFRACTIONATED)
Heparin Unfractionated: <0.1 IU/mL — ABNORMAL LOW (ref 0.30–0.70)
Heparin Unfractionated: <0.1 IU/mL — ABNORMAL LOW (ref 0.30–0.70)
Heparin Unfractionated: 0.49 IU/mL (ref 0.30–0.70)

## 2021-07-22 LAB — SURGICAL PCR SCREEN
MRSA, PCR: NEGATIVE
Staphylococcus aureus: NEGATIVE

## 2021-07-22 LAB — TROPONIN I (HIGH SENSITIVITY)
Troponin I (High Sensitivity): 9 ng/L (ref <18)
Troponin I (High Sensitivity): 9 ng/L (ref <18)

## 2021-07-22 LAB — BRAIN NATRIURETIC PEPTIDE: B Natriuretic Peptide: 235.3 pg/mL — ABNORMAL HIGH (ref 0.0–100.0)

## 2021-07-22 LAB — MAGNESIUM: Magnesium: 1.8 mg/dL (ref 1.7–2.4)

## 2021-07-22 LAB — D-DIMER, QUANTITATIVE: D-Dimer, Quant: 2.1 ug/mL-FEU — ABNORMAL HIGH (ref 0.00–0.50)

## 2021-07-22 LAB — APTT: aPTT: 47 seconds — ABNORMAL HIGH (ref 24–36)

## 2021-07-22 MED ORDER — HEPARIN BOLUS VIA INFUSION
1500.0000 [IU] | Freq: Once | INTRAVENOUS | Status: AC
Start: 2021-07-22 — End: 2021-07-22
  Administered 2021-07-22: 1500 [IU] via INTRAVENOUS
  Filled 2021-07-22: qty 1500

## 2021-07-22 MED ORDER — DEXTROSE 50 % IV SOLN
1.0000 | INTRAVENOUS | Status: DC | PRN
  Administered 2021-07-24 – 2021-07-28 (×7): 50 mL via INTRAVENOUS
  Filled 2021-07-22 (×7): qty 50

## 2021-07-22 MED ORDER — NALOXONE HCL 0.4 MG/ML IJ SOLN
INTRAMUSCULAR | Status: AC
  Filled 2021-07-22: qty 1

## 2021-07-22 MED ORDER — ACETAMINOPHEN 325 MG PO TABS
650.0000 mg | ORAL_TABLET | Freq: Four times a day (QID) | ORAL | Status: DC | PRN
  Administered 2021-07-22 – 2021-07-23 (×2): 650 mg via ORAL
  Filled 2021-07-22 (×2): qty 2

## 2021-07-22 MED ORDER — VANCOMYCIN HCL 1250 MG/250ML IV SOLN
1250.0000 mg | INTRAVENOUS | Status: DC
  Administered 2021-07-22 – 2021-07-26 (×5): 1250 mg via INTRAVENOUS
  Filled 2021-07-22 (×5): qty 250

## 2021-07-22 MED ORDER — POTASSIUM CHLORIDE CRYS ER 20 MEQ PO TBCR
40.0000 meq | EXTENDED_RELEASE_TABLET | Freq: Once | ORAL | Status: AC
Start: 2021-07-22 — End: 2021-07-22
  Administered 2021-07-22: 40 meq via ORAL
  Filled 2021-07-22: qty 2

## 2021-07-22 MED ORDER — SODIUM CHLORIDE 0.9 % IV SOLN
2.0000 g | Freq: Two times a day (BID) | INTRAVENOUS | Status: DC
  Administered 2021-07-22 – 2021-07-26 (×8): 2 g via INTRAVENOUS
  Filled 2021-07-22 (×9): qty 12.5

## 2021-07-22 MED ORDER — DEXTROSE 50 % IV SOLN: 1.0000 | Freq: Once | INTRAVENOUS | Status: DC

## 2021-07-22 NOTE — Progress Notes (Signed)
?PROGRESS NOTE ? ? ? [name]  JSE:[id number] DOB: 1945-07-18 DOA: 07/21/2021 ?PCP: Aletha Halim., PA-C  ?Outpatient Specialists:  ? ? ?Brief Narrative:  ?Patient is a 76 year old female with advanced dementia, prior history midostaurin fibrillation on Xarelto, rheumatoid arthritis on chronic steroids, insulin-dependent diabetes mellitus, hypertension, chronic diastolic CHF, anemia secondary to CKD, chronic kidney disease stage IIIa, and chronic pressure injury of skin of right foot.  Patient was admitted with atrial fibrillation with rapid ventricular response.  I was asked to see patient emergently due to temperature of 100.5, increased respiratory rate, but stable blood pressure.  There are concerns for possible SIRS/early sepsis syndrome. ? ?07/22/2021: Patient seen alongside patient's nurse and rapid response nurse.  Patient is sleeping a bit deeply.  Apparently, patient was given 7.5 mg of oxycodone.  Patient is unable to give any history.  We will panculture patient, check lactic acid, procalcitonin, urinalysis and urine culture.  We will also get a chest x-ray, cardiac BNP, D-dimer and troponin.  We will start patient on vancomycin and cefepime.  Cellulitis of the left foot extremities noted, though, mild.  Heart rate seems to have improved significantly.  The heart rate is in the low 100s. ? ? ?Assessment & Plan: ?  ?Principal Problem: ?  A-fib ([place]) ?Active Problems: ?  Anemia of chronic renal failure, stage 3 (moderate) (HCC) ?  Hypomagnesemia ?  Hypokalemia ? ?SIRS/possible early sepsis: ?-Admitted with A-fib/RVR. ?-Ventricular rate is improving. ?-Patient has been on IV Cardizem. ?-Temp of 100.5 ?F noted. ?-No leukocytosis. ?-Tachypnea reported. ?-Blood pressure stable. ?-Panculture patient. ?-Left foot (dorsum of left foot) mild cellulitis noted. ?-IV Vanco and cefepime ordered. ?-Check lactic acid and procalcitonin. ?-Also check D-dimer. ? ?A-fib with RVR: ?-Improving. ?-On heparin. ?-Check  D-dimer, cardiac BNP, chest x-ray. ?-Heart rate is improving. ? ?Hypomagnesemia: ?-Continue to monitor and replete. ?-Hypokalemia: ?-Continue to monitor and replete. ? ?Advanced dementia: ?-Supportive care. ? ?Rheumatoid arthritis: ?-Long-term prednisone. ? ?Insulin-dependent diabetes mellitus: ?-On subcutaneous Lantus 30 units at bedtime and sliding scale insulin coverage. ?-Continue to monitor. ? ?History of CKD 3A/anemia: ?-Continue to monitor closely. ? ? ?DVT prophylaxis: Heparin drip. ?Code Status: DO NOT RESUSCITATE ?Family Communication:  ?Disposition Plan:  ? ? ?Consultants:  ?Wound care ? ?Procedures:  ?None. ? ?Antimicrobials:  ?IV vancomycin ?IV cefepime ? ? ?Subjective: ?No history from patient. ?-Sleeping deeply. ? ?Objective: ?Vitals:  ? 07/22/21 0829 07/22/21 1137 07/22/21 1140 07/22/21 1227  ?BP: 114/70 (!) 116/50 (!) 116/50 124/63  ?Pulse:      ?Resp: 16 (!) 26  (!) 28  ?Temp: 99.3 ?F (37.4 ?C) (!) 100.5 ?F (38.1 ?C)  (!) 100.8 ?F (38.2 ?C)  ?TempSrc: Axillary Axillary  Rectal  ?SpO2: 98% 95%  100%  ?Weight:      ?Height:      ? ? ?Intake/Output Summary (Last 24 hours) at 07/22/2021 1244 ?Last data filed at 07/22/2021 1100 ?Gross per 24 hour  ?Intake 1417.19 ml  ?Output --  ?Net 1417.19 ml  ? ?Filed Weights  ? 07/21/21 1100  ?Weight: 63.5 kg  ? ? ?Examination: ? ?General exam: Appears calm and comfortable increased respiratory rate but not obvious respiratory distress. ?Respiratory system: Clear to auscultation.  ?Cardiovascular system: S1 & S2, irregularly irregular.   ?Gastrointestinal system: Abdomen is nondistended, soft and nontender. No organomegaly or masses felt. Normal bowel sounds heard. ?Central nervous system: Sleeping quietly. ?Extremities: Redness of the dorsum of left foot.   ? ? ?Data Reviewed: I have personally reviewed  following labs and imaging studies ? ?CBC: ?Recent Labs  ?Lab 07/21/21 ?1136 07/22/21 ?0601  ?WBC 10.5 10.2  ?NEUTROABS 7.7  --   ?HGB 10.1* 8.2*  ?HCT 34.8* 27.4*   ?MCV 84.1 82.5  ?PLT 446* 377  ? ?Basic Metabolic Panel: ?Recent Labs  ?Lab 07/21/21 ?1136 07/22/21 ?0601  ?NA 141 139  ?K 3.6 3.3*  ?CL 104 108  ?CO2 23 22  ?GLUCOSE 146* 107*  ?BUN 18 13  ?CREATININE 0.90 0.69  ?CALCIUM 8.8* 7.8*  ?MG 1.4* 1.8  ? ?GFR: ?Estimated Creatinine Clearance: 58.2 mL/min (by C-G formula based on SCr of 0.69 mg/dL). ?Liver Function Tests: ?Recent Labs  ?Lab 07/21/21 ?1136  ?AST 29  ?ALT 21  ?ALKPHOS 277*  ?BILITOT 1.1  ?PROT 6.6  ?ALBUMIN 2.2*  ? ?Recent Labs  ?Lab 07/21/21 ?1136  ?LIPASE 43  ? ?No results for input(s): AMMONIA in the last 168 hours. ?Coagulation Profile: ?Recent Labs  ?Lab 07/21/21 ?1136  ?INR 1.4*  ? ?Cardiac Enzymes: ?No results for input(s): CKTOTAL, CKMB, CKMBINDEX, TROPONINI in the last 168 hours. ?BNP (last 3 results) ?No results for input(s): PROBNP in the last 8760 hours. ?HbA1C: ?No results for input(s): HGBA1C in the last 72 hours. ?CBG: ?Recent Labs  ?Lab 07/21/21 ?2120 07/22/21 ?0212 07/22/21 ?7902 07/22/21 ?4097 07/22/21 ?1136  ?GLUCAP 138* 88 97 86 148*  ? ?Lipid Profile: ?No results for input(s): CHOL, HDL, LDLCALC, TRIG, CHOLHDL, LDLDIRECT in the last 72 hours. ?Thyroid Function Tests: ?Recent Labs  ?  07/21/21 ?1700  ?TSH 1.096  ? ?Anemia Panel: ?No results for input(s): VITAMINB12, FOLATE, FERRITIN, TIBC, IRON, RETICCTPCT in the last 72 hours. ?Urine analysis: ?   ?Component Value Date/Time  ? COLORURINE YELLOW 06/28/2021 1550  ? APPEARANCEUR CLEAR 06/28/2021 1550  ? LABSPEC 1.020 06/28/2021 1550  ? PHURINE 5.0 06/28/2021 1550  ? GLUCOSEU NEGATIVE 06/28/2021 1550  ? HGBUR NEGATIVE 06/28/2021 1550  ? [place] NEGATIVE 06/28/2021 1550  ? [employer] NEGATIVE 06/28/2021 1550  ? PROTEINUR >=300 (A) 06/28/2021 1550  ? UROBILINOGEN 1.0 10/09/2014 0040  ? NITRITE NEGATIVE 06/28/2021 1550  ? LEUKOCYTESUR NEGATIVE 06/28/2021 1550  ? ?Sepsis Labs: ?'@LABRCNTIP'$ (procalcitonin:4,lacticidven:4) ? ?) ?Recent Results (from the past 240 hour(s))  ?Resp Panel by RT-PCR  (Flu A&B, Covid) Nasopharyngeal Swab     Status: None  ? Collection Time: 07/21/21  2:09 PM  ? Specimen: Nasopharyngeal Swab; Nasopharyngeal(NP) swabs in vial transport medium  ?Result Value Ref Range Status  ? SARS Coronavirus 2 by RT PCR NEGATIVE NEGATIVE Final  ?  Comment: (NOTE) ?SARS-CoV-2 target nucleic acids are NOT DETECTED. ? ?The SARS-CoV-2 RNA is generally detectable in upper respiratory ?specimens during the acute phase of infection. The lowest ?concentration of SARS-CoV-2 viral copies this assay can detect is ?138 copies/mL. A negative result does not preclude SARS-Cov-2 ?infection and should not be used as the sole basis for treatment or ?other patient management decisions. A negative result may occur with  ?improper specimen collection/handling, submission of specimen other ?than nasopharyngeal swab, presence of viral mutation(s) within the ?areas targeted by this assay, and inadequate number of viral ?copies(<138 copies/mL). A negative result must be combined with ?clinical observations, patient history, and epidemiological ?information. The expected result is Negative. ? ?Fact Sheet for Patients:  ?EntrepreneurPulse.com.au ? ?Fact Sheet for Healthcare Providers:  ?IncredibleEmployment.be ? ?This test is no t yet approved or cleared by the [place] FDA and  ?has been authorized for detection and/or diagnosis of SARS-CoV-2 by ?FDA under an Emergency Use  Authorization (EUA). This EUA will remain  ?in effect (meaning this test can be used) for the duration of the ?COVID-19 declaration under Section 564(b)(1) of the Act, 21 ?U.S.C.section 360bbb-3(b)(1), unless the authorization is terminated  ?or revoked sooner.  ? ? ?  ? Influenza A by PCR NEGATIVE NEGATIVE Final  ? Influenza B by PCR NEGATIVE NEGATIVE Final  ?  Comment: (NOTE) ?The Xpert Xpress SARS-CoV-2/FLU/RSV plus assay is intended as an aid ?in the diagnosis of influenza from Nasopharyngeal swab specimens  and ?should not be used as a sole basis for treatment. Nasal washings and ?aspirates are unacceptable for Xpert Xpress SARS-CoV-2/FLU/RSV ?testing. ? ?Fact Sheet for Patients: ?SignatureTicket.co.uk

## 2021-07-22 NOTE — Progress Notes (Signed)
ANTICOAGULATION CONSULT NOTE ?Pharmacy Consult for Heparin ?Indication: atrial fibrillation ?Brief A/P: Heparin level subtherapeutic Increase Heparin rate ? ?Allergies  ?Allergen Reactions  ? Promethazine Other (See Comments)  ?  Not listed on MAR, unknown reaction  ? ? ?Patient Measurements: ?Height: '5\' 7"'$  (170.2 cm) ?Weight: 63.5 kg (140 lb) ?IBW/kg (Calculated) : 61.6 ?Heparin Dosing Weight: 63.5 kg ? ?Vital Signs: ?Temp: 97.9 ?F (36.6 ?C) (04/20 0525) ?Temp Source: Oral (04/20 0525) ?BP: 140/73 (04/20 0525) ? ?Labs: ?Recent Labs  ?  07/21/21 ?1136 07/21/21 ?1409 07/21/21 ?1700 07/21/21 ?2003 07/22/21 ?0601  ?HGB 10.1*  --   --   --  8.2*  ?HCT 34.8*  --   --   --  27.4*  ?PLT 446*  --   --   --  377  ?APTT  --   --  35 36 47*  ?LABPROT 17.4*  --   --   --   --   ?INR 1.4*  --   --   --   --   ?HEPARINUNFRC  --   --   --   --  <0.10*  ?CREATININE 0.90  --   --   --  0.69  ?TROPONINIHS 13 11  --   --   --   ? ? ? ?Estimated Creatinine Clearance: 58.2 mL/min (by C-G formula based on SCr of 0.69 mg/dL). ? ? ?Assessment: ?76 y.o. female with Afib for heparin ? ?Goal of Therapy:  ?Heparin level 0.3-0.7 units/ml ?Monitor platelets by anticoagulation protocol: Yes ?  ?Plan:  ?Increase Heparin 1200 units/hr ?Check heparin level in 8 hours. ? ?Phillis Knack, PharmD, BCPS ? ? ?  ?

## 2021-07-22 NOTE — Progress Notes (Signed)
Pharmacy Antibiotic Note ? ?[name] is a 76 y.o. female admitted on 07/21/2021 with AFib RVR, now concerns for sepsis with increased RR and elevated temp. Pharmacy has been consulted for vancomycin and cefepime dosing. Cr is stable <1, Cx sent. ? ?Plan: ?Cefepime 2g IV q12h ?Vancomycin '1250mg'$  IV q24h - est AUC 523 ?Follow Cr, Cx, LOT ?Vancomycin levels as needed ? ?Height: '5\' 7"'$  (170.2 cm) ?Weight: 63.5 kg (140 lb) ?IBW/kg (Calculated) : 61.6 ? ?Temp (24hrs), Avg:98.9 ?F (37.2 ?C), Min:96.7 ?F (35.9 ?C), Max:100.8 ?F (38.2 ?C) ? ?Recent Labs  ?Lab 07/21/21 ?1136 07/22/21 ?0601  ?WBC 10.5 10.2  ?CREATININE 0.90 0.69  ?  ?Estimated Creatinine Clearance: 58.2 mL/min (by C-G formula based on SCr of 0.69 mg/dL).   ? ?Allergies  ?Allergen Reactions  ? Promethazine Other (See Comments)  ?  Not listed on MAR, unknown reaction  ? ? ?Antimicrobials this admission: ?Vancomycin 4/20 >>  ?Cefepime 4/20 >>  ? ? ?Microbiology results: ?Pending ? ? ?Thank you for allowing pharmacy to be a part of this patient?s care. ? ? ?Arrie Senate, PharmD, BCPS, BCCP ?Clinical Pharmacist ?[phone] ?Please check AMION for all [place] numbers ?07/22/2021 ? ? ?

## 2021-07-22 NOTE — Significant Event (Signed)
Rapid Response Event Note  ? ?Reason for Call :  ?Elevated temp and rapid RR ? ?Initial Focused Assessment:  ?Patient is arousable to voice.  She has recently received oxycodone for pain.   ?Lung sounds are decreased in the bases ?Heart tones irregular ? ?AF 109-120  BP 124/63  RR 28  O2 sat 100% on [place]  rectal temp 100.8 ? ?Left foot with wound (see WOC note) left foot with mild swelling and redness. ? ?Dr Marthenia Rolling came to bedside to assess patient ? ? ?Interventions:  ?Labs ?PCXR ?abx ? ? ?Plan of Care:  ? ? ?Event Summary:  ? ?MD Notified: Marthenia Rolling ?Call Time: 1204 ?Arrival Time: 1208 ?End Time: 1300 ? ?Raliegh Ip, RN ?

## 2021-07-22 NOTE — Consult Note (Signed)
[facility] Nurse Consult Note: ?Patient receiving care in [place]. ?Reason for Consult: right foot wound ?Wound type: Left foot round wrapped in kerlix with plantar 1st metatarsal head and 3rd toe tip wound areas dressed. No outward indication of wound on right foot--it had a sock on it. ?Pressure Injury POA: Yes/No/NA ?Measurement: Right 4th toe tip is heavily calloused. No open wounds found. ?Left 1st MH wound in pink, measures 1 cm x 1 cm and has no depth or drainage. ?Left 3rd toe tip has a callous. ?Wound bed: ?Drainage (amount, consistency, odor)  ?Periwound: intact ?Dressing procedure/placement/frequency: ?Place a small foam dressing over the left plantar wound at the great toe area, change every 3 days and prn. ?Apply iodine from the swabsticks or swab pads from clean utility to right 4th toe tip and left 3rd toe tip each shift.  Allow to air dry.  ? ?[facility] nurse will not follow at this time.  Please re-consult the [facility] team if needed. ? ?Val Riles, RN, MSN, CWOCN, CNS-BC, pager [phone]  ?  ?

## 2021-07-22 NOTE — Progress Notes (Addendum)
TR[facility] night cross cover note: ? ?I was notified by RN of patient's refusal to take oral medications. ? ?Per my chart review, including review of hospitalist H&P, this is a 76 year old F who was admitted on the afternoon of 07/21/2021 with atrial fibrillation RVR in the setting of suspected suboptimal compliance on her outpatient oral AV nodal blocking regimen.  She was started on Cardizem drip and admitted for further optimization of rate control.  ? ?On Cardizem drip at a rate of 15 mL/h heart rate has improved with sustained rates in the 90s to low 100s, although RN conveys occasional, transient, nonsustained decreased heart rates into the 40s before returning back to rates in the 90s to low 100s.  Patient without acute symptoms at the time of these transient, nonsustained decreased heart rates.  Additionally, no evidence of hypotension.   ? ?Considered the possibility of beginning transition off diltiazem drip onto oral AV nodal blocking medications.  However, this process is complicated by the patient's refusal to take oral medications at this time.  Consequently, I have modified existing Cardizem drip order to reduce rate of drip to 10 mL/h, with close monitoring of ensuing heart rate.  ? ?Additionally, the patient had also refused to eat dinner.  Within this context, she received her outpatient scheduled insulin glargine 30 units subcu nightly, at which time CBG reflected value of 138.  Ensuing CBG check at 2 AM reflected updated blood sugar of 86.  At that time, patient consented to consumption of a can of Ensure Glucerna, and is without acute complaint at this time.  Repeat CBG has been ordered for 5 AM in order to gain further insight into the trend in patient's blood sugar.  Subsequent to that, the patient has existing orders for Accu-Cheks on a qAC and qHS basis.  ? ? ?UPDATE: while on Cardizem drip at rate of 10 mL/hr, sustained HR's noted to increase into the 120's associated with patient's Afib.  Patient  remains asymptomatic, and blood pressure tolerating these rates, with most recent blood pressure noted to be 140/73. No evidence of acute respiratory distress.  No additional transient bradycardia noted on this slower rate of Cardizem drip.  Consequently, I have asked RN to increase rate of Cardizem drip to 15 mL/hr, with plan to closely monitor ensuing HR/BP.  ? ? ?Babs Bertin, DO ?Hospitalist ? ?

## 2021-07-22 NOTE — Progress Notes (Signed)
?   07/22/21 1137  ?Assess: MEWS Score  ?Temp (!) 100.5 ?F (38.1 ?C)  ?BP (!) 116/50  ?ECG Heart Rate (!) 108  ?Resp (!) 26  ?SpO2 95 %  ?O2 Device Room Air  ?Assess: MEWS Score  ?MEWS Temp 1  ?MEWS Systolic 0  ?MEWS Pulse 1  ?MEWS RR 2  ?MEWS LOC 0  ?MEWS Score 4  ?MEWS Score Color Red  ?Assess: if the MEWS score is Yellow or Red  ?Were vital signs taken at a resting state? Yes  ?Focused Assessment Change from prior assessment (see assessment flowsheet)  ?Early Detection of Sepsis Score *See Row Information* Medium  ?MEWS guidelines implemented *See Row Information* Yes  ?Take Vital Signs  ?Increase Vital Sign Frequency  Red: Q 1hr X 4 then Q 4hr X 4, if remains red, continue Q 4hrs  ?Escalate  ?MEWS: Escalate Red: discuss with charge nurse/RN and provider, consider discussing with RRT  ?Notify: Charge Nurse/RN  ?Name of Charge Nurse/RN Notified 1137  ?Date Charge Nurse/RN Notified 07/22/21  ?Time Charge Nurse/RN Notified 1137  ?Notify: Provider  ?Provider Name/Title DR Marthenia Rolling  ?Date Provider Notified 07/22/21  ?Time Provider Notified 1140  ?Notification Type Page  ?Notification Reason Change in status  ?Provider response En route;Other (Comment) ?(md CAME TO DEPARTMENT)  ?Date of Provider Response 07/22/21  ?Time of Provider Response 1145  ?Notify: Rapid Response  ?Name of Rapid Response RN Notified helle rn  ?Date Rapid Response Notified 07/22/21  ?Time Rapid Response Notified 1145  ?Document  ?Patient Outcome Other (Comment) ?(SEPSIS PROTOCOL STARTED)  ?Progress note created (see row info) Yes  ? ? ?

## 2021-07-22 NOTE — Progress Notes (Signed)
ANTICOAGULATION CONSULT NOTE ? ?Pharmacy Consult for Heparin ?Indication: atrial fibrillation ? ?Allergies  ?Allergen Reactions  ? Promethazine Other (See Comments)  ?  Not listed on MAR, unknown reaction  ? ? ?Patient Measurements: ?Height: '5\' 7"'$  (170.2 cm) ?Weight: 63.5 kg (140 lb) ?IBW/kg (Calculated) : 61.6 ?Heparin Dosing Weight: 63.5 kg ? ?Vital Signs: ?Temp: 97.8 ?F (36.6 ?C) (04/20 1327) ?Temp Source: Axillary (04/20 1327) ?BP: 101/66 (04/20 1327) ?Pulse Rate: 103 (04/20 1327) ? ?Labs: ?Recent Labs  ?  07/21/21 ?1136 07/21/21 ?1409 07/21/21 ?1700 07/21/21 ?2003 07/22/21 ?0601 07/22/21 ?1309  ?HGB 10.1*  --   --   --  8.2*  --   ?HCT 34.8*  --   --   --  27.4*  --   ?PLT 446*  --   --   --  377  --   ?APTT  --   --  35 36 47*  --   ?LABPROT 17.4*  --   --   --   --   --   ?INR 1.4*  --   --   --   --   --   ?HEPARINUNFRC  --   --   --   --  <0.10* <0.10*  ?CREATININE 0.90  --   --   --  0.69  --   ?TROPONINIHS 13 11  --   --   --  9  ? ? ? ?Estimated Creatinine Clearance: 58.2 mL/min (by C-G formula based on SCr of 0.69 mg/dL). ? ? ?Medical History: ?Past Medical History:  ?Diagnosis Date  ? Abnormal LFTs 07/24/2017  ? Actinic keratosis   ? Acute CVA (cerebrovascular accident) ([place])   ? Acute deep vein thrombosis (DVT) of left lower extremity ([place]) 05/06/2014  ? Anemia   ? hx of  ? Anemia of chronic renal failure, stage 3 (moderate) ([place]) 07/17/2015  ? Anticoagulant causing adverse effect in therapeutic use 07/17/2015  ? Anxiety   ? Arthritis   ? rheumatoid  ? Atrial fibrillation ([place])   ? a. persistent, she remains on Xarelto  ? Carpal tunnel syndrome   ? Chronic diastolic (congestive) heart failure (HCC)   ? a. 04/2016: echo showing a preserved EF of 55-60% with mild MR. LA and RA midly dilated.   ? Edema   ? left leg at ankle resolved now  ? Gastroesophageal reflux disease   ? Hepatitis 1970  ? not sure what kind  ? Hiatal hernia   ? Hyperlipidemia   ? Hypertension   ? Iron deficiency anemia due to chronic  blood loss 07/17/2015  ? Jaundice   ? age 15  ? Peripheral vascular disease ([place]) yrs ago  ? DVT left lower leg questionale told by 2 drs i had no clot, 1 md said i did  ? Rheumatoid arthritis(714.0)   ? Right knee pain   ? Stroke [facility])   ? Type II diabetes mellitus ([place])   ? type2  ? ? ?Medications:  ?Medications Prior to Admission  ?Medication Sig Dispense Refill Last Dose  ? acetaminophen (TYLENOL) 650 MG CR tablet Take 1,300 mg by mouth in the morning and at bedtime.   07/20/2021  ? albuterol (VENTOLIN HFA) 108 (90 Base) MCG/ACT inhaler Inhale 2 puffs into the lungs every 4 (four) hours as needed for wheezing or shortness of breath.   unk  ? Amino Acids-Protein Hydrolys (FEEDING SUPPLEMENT, PRO-STAT 64,) LIQD Take 30 mLs by mouth in the morning.   07/20/2021  ? atorvastatin (  LIPITOR) 20 MG tablet Take 20 mg by mouth daily.   07/20/2021  ? azelastine (OPTIVAR) 0.05 % ophthalmic solution Place 1 drop into both eyes 2 (two) times daily.   07/20/2021  ? buPROPion ER (WELLBUTRIN SR) 100 MG 12 hr tablet Take 1 tablet (100 mg total) by mouth 2 (two) times daily.   07/20/2021  ? Calcium Carbonate-Vit D-Min (CALCIUM 1200) 1200-1000 MG-UNIT CHEW Chew 1 tablet by mouth daily.   07/20/2021  ? carboxymethylcellulose (ARTIFICIAL TEARS) 1 % ophthalmic solution Place 2 drops into both eyes in the morning.   07/20/2021  ? cefTRIAXone (ROCEPHIN) 1 g injection Inject 1 g into the muscle See admin instructions. 1g intramuscularly once daily for 3 days   Past Week  ? cetirizine (ZYRTEC) 10 MG tablet Take 10 mg by mouth daily.   07/20/2021  ? cycloSPORINE (RESTASIS) 0.05 % ophthalmic emulsion Place 1 drop into both eyes 2 (two) times daily.   07/20/2021  ? dextrose 5 % solution Inject 50 mLs into the vein continuous. 50cc/hr   Past Week  ? diclofenac Sodium (VOLTAREN) 1 % GEL Apply 2 g topically daily as needed for pain.   Past Month  ? diltiazem (CARDIZEM LA) 240 MG 24 hr tablet Take 1 tablet (240 mg total) by mouth 2 (two) times daily. 60  tablet 1 07/20/2021  ? Emollient (AQUAPHOR ADV THERAPY HEALING) OINT Apply 1 application. topically See admin instructions. To rash on face in the evening every Monday, Thursday   Past Week  ? Eyelid Cleansers (OCUSOFT LID SCRUB ORIGINAL) PADS Apply 1 application. topically in the morning and at bedtime. Both eyelids   07/20/2021  ? ferrous sulfate 325 (65 FE) MG EC tablet Take 325 mg by mouth 3 (three) times daily with meals.   07/20/2021  ? fluticasone-salmeterol (ADVAIR HFA) 115-21 MCG/ACT inhaler Inhale 2 puffs into the lungs 2 times daily at 12 noon and 4 pm.   07/20/2021  ? furosemide (LASIX) 20 MG tablet Take 20 mg by mouth daily as needed for edema.   unk  ? gabapentin (NEURONTIN) 300 MG capsule Take 300 mg by mouth 3 (three) times daily.   07/20/2021  ? insulin glargine (LANTUS) 100 UNIT/ML injection Inject 30 Units into the skin daily. Hold if BS is less than 150   Past Month  ? insulin lispro (HUMALOG) 100 UNIT/ML injection Inject 6 Units into the skin in the morning and at bedtime. Hold if BS <100   Past Week  ? magnesium oxide (MAG-OX) 400 MG tablet Take 400 mg by mouth 2 (two) times daily.   07/20/2021  ? Menthol-Zinc Oxide (CALMOSEPTINE) 0.44-20.6 % OINT Apply 1 application. topically every 8 (eight) hours as needed (protection/prevention).   unk  ? metFORMIN (GLUCOPHAGE) 500 MG tablet Take 1,000 mg by mouth 2 (two) times daily.   07/20/2021  ? metoprolol tartrate (LOPRESSOR) 100 MG tablet Take 1 tablet (100 mg total) by mouth 2 (two) times daily.   07/20/2021 at 08:00  ? Multiple Vitamin (MULTIVITAMIN) tablet Take 1 tablet by mouth daily.   07/20/2021  ? Multiple Vitamins-Minerals (PRESERVISION AREDS PO) Take 1 capsule by mouth daily.   07/20/2021  ? nitroGLYCERIN (NITROSTAT) 0.4 MG SL tablet Place 1 tablet (0.4 mg total) under the tongue every 5 (five) minutes as needed for chest pain (x 3 doses). 25 tablet 2 unk  ? omeprazole (PRILOSEC OTC) 20 MG tablet Take 20 mg by mouth daily.   07/21/2021  ? oxyCODONE  (ROXICODONE) 15 MG  immediate release tablet Take 7.5 mg by mouth every 8 (eight) hours as needed for pain.   Past Week  ? polyethylene glycol (MIRALAX / GLYCOLAX) 17 g packet Take 17 g by mouth 2 (two) times daily as needed for mild constipation.   unk  ? predniSONE (DELTASONE) 5 MG tablet Take 5 mg by mouth daily.   07/20/2021  ? rivaroxaban (XARELTO) 20 MG TABS tablet Take 1 tablet (20 mg total) by mouth daily at 6 PM. 30 tablet 2 07/20/2021 at 17:00  ? saccharomyces boulardii (FLORASTOR) 250 MG capsule Take 250 mg by mouth 2 (two) times daily.   07/20/2021  ? senna (SENOKOT) 8.6 MG TABS tablet Take 1 tablet by mouth at bedtime.   Past Week  ? VASCEPA 1 g CAPS Take 1 g by mouth daily.   07/20/2021  ? vitamin C (ASCORBIC ACID) 500 MG tablet Take 500 mg by mouth 2 (two) times daily.   07/20/2021  ? zinc sulfate 220 (50 Zn) MG capsule Take 220 mg by mouth daily.   07/20/2021  ? feeding supplement (ENSURE ENLIVE / ENSURE PLUS) LIQD Take 237 mLs by mouth 2 (two) times daily between meals. (Patient not taking: Reported on 07/21/2021)   Not Taking  ? furosemide (LASIX) 40 MG tablet Take 0.5 tablets (20 mg total) by mouth daily as needed for fluid or edema. (Patient not taking: Reported on 07/21/2021)   Not Taking  ? gabapentin (NEURONTIN) 100 MG capsule Take 3 capsules (300 mg total) by mouth 3 (three) times daily. (Patient not taking: Reported on 07/21/2021)   Not Taking  ? oxyCODONE (OXY IR/ROXICODONE) 5 MG immediate release tablet Take 1.5 tablets (7.5 mg total) by mouth every 8 (eight) hours as needed for severe pain. (Patient not taking: Reported on 07/21/2021) 5 tablet 0 Not Taking  ? ? ?Scheduled:  ? atorvastatin  20 mg Oral Daily  ? buPROPion ER  100 mg Oral BID  ? cycloSPORINE  1 drop Both Eyes BID  ? ferrous sulfate  325 mg Oral TID WC  ? gabapentin  300 mg Oral TID  ? icosapent Ethyl  1 g Oral Daily  ? insulin aspart  0-15 Units Subcutaneous TID WC  ? insulin glargine-yfgn  30 Units Subcutaneous QHS  ? ketotifen  1 drop  Both Eyes BID  ? loratadine  10 mg Oral Daily  ? magnesium oxide  400 mg Oral BID  ? metFORMIN  1,000 mg Oral BID AC  ? metoprolol tartrate  50 mg Oral Once  ? mometasone-formoterol  2 puff Inhalation BID  ? n

## 2021-07-22 NOTE — TOC Initial Note (Signed)
Transition of Care (TOC) - Initial/Assessment Note  ? ? ?Patient Details  ?Name: [name] ?MRN: [id number] ?Date of Birth: [birth year] ? ?Transition of Care (TOC) CM/SW Contact:    ?Milas Gain, LCSWA ?Phone Number: ?07/22/2021, 12:12 PM ? ?Clinical Narrative:                 ? ?CSW spoke with patients son Fara Olden who confirmed dc plan for patient is to [facility] when medically ready for dc. CSW spoke with Melissa at [facility] who confirmed patient comes from there long term. CSW will continue to follow and assist with patients dc planning needs. ?Expected Discharge Plan: [place] ?Barriers to Discharge: Continued Medical Work up ? ? ?Patient Goals and CMS Choice ?  ?CMS Medicare.gov Compare Post Acute Care list provided to:: Patient Represenative (must comment) (Patientds son Fara Olden) ?Choice offered to / list presented to : Adult Children (Patients son Fara Olden) ? ?Expected Discharge Plan and Services ?Expected Discharge Plan: [place] ?In-house Referral: Clinical Social Work ?  ?  ?Living arrangements for the past 2 months: [place] ?                ?  ?  ?  ?  ?  ?  ?  ?  ?  ?  ? ?Prior Living Arrangements/Services ?Living arrangements for the past 2 months: [place] ?Lives with:: Facility Resident ?Patient language and need for interpreter reviewed:: Yes ?Do you feel safe going back to the place where you live?: Yes      ?Need for Family Participation in Patient Care: Yes (Comment) ?Care giver support system in place?: Yes (comment) ?  ?Criminal Activity/Legal Involvement Pertinent to Current Situation/Hospitalization: No - Comment as needed ? ?Activities of Daily Living ?  ?  ? ?Permission Sought/Granted ?Permission sought to share information with : Case Manager, Family Supports, Customer service manager ?Permission granted to share information with : No ? Share Information with NAME: Patient only oriented to person spoke  with patients son Fara Olden ? Permission granted to share info w AGENCY: Patient only oriented to person spoke with patients son Joel/SNF ? Permission granted to share info w Relationship: Patient only oriented to person spoke with patients son Fara Olden ? Permission granted to share info w Contact Information: Patient only oriented to person spoke with patients son Fara Olden [phone] ? ?Emotional Assessment ?  ?  ?  ?Orientation: : Oriented to Self ?Alcohol / Substance Use: Not Applicable ?Psych Involvement: No (comment) ? ?Admission diagnosis:  A-fib ([place]) [I48.91] ?Atrial fibrillation with RVR ([place]) [I48.91] ?Dementia with agitation, unspecified dementia severity, unspecified dementia type ([place]) [F03.911] ?Patient Active Problem List  ? Diagnosis Date Noted  ? Protein-calorie malnutrition, severe 06/19/2021  ? Encephalopathy   ? Contusion of scalp   ? Fever   ? Delirium   ? Agitation   ? Bacteremia due to Proteus species   ? Cellulitis of left lower extremity   ? Hypokalemia 05/21/2021  ? Acute metabolic encephalopathy 30/16/0109  ? Pressure injury of skin 05/21/2021  ? Sepsis due to undetermined organism ([place]) 03/30/2020  ? Hypomagnesemia 03/30/2020  ? Hypophosphatemia 03/30/2020  ? Hyperglycemia   ? Iliopsoas abscess on left ([place]) 10/04/2017  ? HCAP (healthcare-associated pneumonia) 10/04/2017  ? Acute lower UTI 10/04/2017  ? Hallucinations 07/25/2017  ? Abnormal LFTs 07/24/2017  ? Acute on chronic diastolic CHF (congestive heart failure) ([place])   ? Elevated LFTs   ? AKI (acute  kidney injury) ([place])   ? Elevated troponin   ? Stroke, acute, embolic ([place]) 29/51/8841  ? Mitral regurgitation 06/17/2017  ? Thyroid nodule 06/15/2017  ? CVA (cerebral vascular accident) ([place]) 06/14/2017  ? Peripheral vascular disease ([place])   ? Hiatal hernia   ? Chronic diastolic (congestive) heart failure (HCC)   ? Carpal tunnel syndrome   ? A-fib ([place])   ? Anemia   ? Actinic keratosis   ? Acute CHF (congestive heart failure) ([place]) 05/20/2016  ?  History of GI bleed 03/29/2016  ? Chronic anticoagulation 03/29/2016  ? Symptomatic anemia 03/19/2016  ? Spinal stenosis of lumbar region 02/10/2016  ? Varicose veins of left leg with edema 09/15/2015  ? Iron deficiency anemia due to chronic blood loss 07/17/2015  ? Anemia of chronic renal failure, stage 3 (moderate) ([place]) 07/17/2015  ? Anticoagulant causing adverse effect in therapeutic use 07/17/2015  ? Symptomatic bradycardia 02/21/2015  ? Bradycardia   ? Abdominal wall mass of right lower quadrant 07/08/2013  ? OA (osteoarthritis) of hip 04/03/2013  ? Osteoarthritis of hip 01/18/2013  ? Night sweats 09/06/2012  ? Chest pain   ? Anxiety and depression   ? Gastroesophageal reflux disease   ? Hepatitis   ? Jaundice   ? Hypertension 07/01/2010  ? Type 2 diabetes mellitus with hyperlipidemia ([place]) 07/01/2010  ? Rheumatoid arthritis ([place]) 07/01/2010  ? Sinus tachycardia 07/01/2010  ? Chest pain, atypical 07/01/2010  ? ?PCP:  Aletha Halim., PA-C ?Pharmacy:  No Pharmacies Listed ? ? ? ?Social Determinants of Health (SDOH) Interventions ?  ? ?Readmission Risk Interventions ? ?  06/29/2021  ? 11:03 AM 05/21/2021  ? 11:40 AM 04/01/2020  ? 12:47 PM  ?Readmission Risk Prevention Plan  ?Transportation Screening Complete Complete Complete  ?PCP or Specialist Appt within 3-5 Days   Not Complete  ?Not Complete comments   Patient's PCP appointments are set up at her facility  ?[place] or Home Care Consult   Complete  ?Social Work Consult for [place] Planning/Counseling   Complete  ?Palliative Care Screening   Complete  ?Medication Review Press photographer) Complete Complete Complete  ?[place] or Home Care Consult Complete    ?SW Recovery Care/Counseling Consult Complete Complete   ?Palliative Care Screening Complete    ?Skilled Nursing Facility Complete Not Applicable   ? ? ? ?

## 2021-07-22 NOTE — Progress Notes (Signed)
ANTICOAGULATION CONSULT NOTE ? ?Pharmacy Consult for Heparin ?Indication: atrial fibrillation ? ?Allergies  ?Allergen Reactions  ? Promethazine Other (See Comments)  ?  Not listed on MAR, unknown reaction  ? ? ?Patient Measurements: ?Height: '5\' 7"'$  (170.2 cm) ?Weight: 63.5 kg (140 lb) ?IBW/kg (Calculated) : 61.6 ?Heparin Dosing Weight: 63.5 kg ? ?Vital Signs: ?Temp: 96.2 ?F (35.7 ?C) (04/20 2016) ?Temp Source: Axillary (04/20 2016) ?BP: 117/68 (04/20 2016) ?Pulse Rate: 92 (04/20 2049) ? ?Labs: ?Recent Labs  ?  07/21/21 ?1136 07/21/21 ?1409 07/21/21 ?1700 07/21/21 ?2003 07/22/21 ?0601 07/22/21 ?1309 07/22/21 ?1526 07/22/21 ?2053  ?HGB 10.1*  --   --   --  8.2*  --   --   --   ?HCT 34.8*  --   --   --  27.4*  --   --   --   ?PLT 446*  --   --   --  377  --   --   --   ?APTT  --   --  35 36 47*  --   --   --   ?LABPROT 17.4*  --   --   --   --   --   --   --   ?INR 1.4*  --   --   --   --   --   --   --   ?HEPARINUNFRC  --   --   --   --  <0.10* <0.10*  --  0.49  ?CREATININE 0.90  --   --   --  0.69  --   --   --   ?TROPONINIHS 13 11  --   --   --  9 9  --   ? ? ? ?Estimated Creatinine Clearance: 58.2 mL/min (by C-G formula based on SCr of 0.69 mg/dL). ? ? ?Medical History: ?Past Medical History:  ?Diagnosis Date  ? Abnormal LFTs 07/24/2017  ? Actinic keratosis   ? Acute CVA (cerebrovascular accident) ([place])   ? Acute deep vein thrombosis (DVT) of left lower extremity ([place]) 05/06/2014  ? Anemia   ? hx of  ? Anemia of chronic renal failure, stage 3 (moderate) ([name]) 07/17/2015  ? Anticoagulant causing adverse effect in therapeutic use 07/17/2015  ? Anxiety   ? Arthritis   ? rheumatoid  ? Atrial fibrillation ([place])   ? a. persistent, she remains on Xarelto  ? Carpal tunnel syndrome   ? Chronic diastolic (congestive) heart failure (HCC)   ? a. 04/2016: echo showing a preserved EF of 55-60% with mild MR. LA and RA midly dilated.   ? Edema   ? left leg at ankle resolved now  ? Gastroesophageal reflux disease   ? Hepatitis 1970  ?  not sure what kind  ? Hiatal hernia   ? Hyperlipidemia   ? Hypertension   ? Iron deficiency anemia due to chronic blood loss 07/17/2015  ? Jaundice   ? age 89  ? Peripheral vascular disease ([place]) yrs ago  ? DVT left lower leg questionale told by 2 drs i had no clot, 1 md said i did  ? Rheumatoid arthritis(714.0)   ? Right knee pain   ? Stroke [facility])   ? Type II diabetes mellitus ([place])   ? type2  ? ? ?Medications:  ?Medications Prior to Admission  ?Medication Sig Dispense Refill Last Dose  ? acetaminophen (TYLENOL) 650 MG CR tablet Take 1,300 mg by mouth in the morning and at bedtime.   07/20/2021  ?  albuterol (VENTOLIN HFA) 108 (90 Base) MCG/ACT inhaler Inhale 2 puffs into the lungs every 4 (four) hours as needed for wheezing or shortness of breath.   unk  ? Amino Acids-Protein Hydrolys (FEEDING SUPPLEMENT, PRO-STAT 64,) LIQD Take 30 mLs by mouth in the morning.   07/20/2021  ? atorvastatin (LIPITOR) 20 MG tablet Take 20 mg by mouth daily.   07/20/2021  ? azelastine (OPTIVAR) 0.05 % ophthalmic solution Place 1 drop into both eyes 2 (two) times daily.   07/20/2021  ? buPROPion ER (WELLBUTRIN SR) 100 MG 12 hr tablet Take 1 tablet (100 mg total) by mouth 2 (two) times daily.   07/20/2021  ? Calcium Carbonate-Vit D-Min (CALCIUM 1200) 1200-1000 MG-UNIT CHEW Chew 1 tablet by mouth daily.   07/20/2021  ? carboxymethylcellulose (ARTIFICIAL TEARS) 1 % ophthalmic solution Place 2 drops into both eyes in the morning.   07/20/2021  ? cefTRIAXone (ROCEPHIN) 1 g injection Inject 1 g into the muscle See admin instructions. 1g intramuscularly once daily for 3 days   Past Week  ? cetirizine (ZYRTEC) 10 MG tablet Take 10 mg by mouth daily.   07/20/2021  ? cycloSPORINE (RESTASIS) 0.05 % ophthalmic emulsion Place 1 drop into both eyes 2 (two) times daily.   07/20/2021  ? dextrose 5 % solution Inject 50 mLs into the vein continuous. 50cc/hr   Past Week  ? diclofenac Sodium (VOLTAREN) 1 % GEL Apply 2 g topically daily as needed for pain.   Past  Month  ? diltiazem (CARDIZEM LA) 240 MG 24 hr tablet Take 1 tablet (240 mg total) by mouth 2 (two) times daily. 60 tablet 1 07/20/2021  ? Emollient (AQUAPHOR ADV THERAPY HEALING) OINT Apply 1 application. topically See admin instructions. To rash on face in the evening every Monday, Thursday   Past Week  ? Eyelid Cleansers (OCUSOFT LID SCRUB ORIGINAL) PADS Apply 1 application. topically in the morning and at bedtime. Both eyelids   07/20/2021  ? ferrous sulfate 325 (65 FE) MG EC tablet Take 325 mg by mouth 3 (three) times daily with meals.   07/20/2021  ? fluticasone-salmeterol (ADVAIR HFA) 115-21 MCG/ACT inhaler Inhale 2 puffs into the lungs 2 times daily at 12 noon and 4 pm.   07/20/2021  ? furosemide (LASIX) 20 MG tablet Take 20 mg by mouth daily as needed for edema.   unk  ? gabapentin (NEURONTIN) 300 MG capsule Take 300 mg by mouth 3 (three) times daily.   07/20/2021  ? insulin glargine (LANTUS) 100 UNIT/ML injection Inject 30 Units into the skin daily. Hold if BS is less than 150   Past Month  ? insulin lispro (HUMALOG) 100 UNIT/ML injection Inject 6 Units into the skin in the morning and at bedtime. Hold if BS <100   Past Week  ? magnesium oxide (MAG-OX) 400 MG tablet Take 400 mg by mouth 2 (two) times daily.   07/20/2021  ? Menthol-Zinc Oxide (CALMOSEPTINE) 0.44-20.6 % OINT Apply 1 application. topically every 8 (eight) hours as needed (protection/prevention).   unk  ? metFORMIN (GLUCOPHAGE) 500 MG tablet Take 1,000 mg by mouth 2 (two) times daily.   07/20/2021  ? metoprolol tartrate (LOPRESSOR) 100 MG tablet Take 1 tablet (100 mg total) by mouth 2 (two) times daily.   07/20/2021 at 08:00  ? Multiple Vitamin (MULTIVITAMIN) tablet Take 1 tablet by mouth daily.   07/20/2021  ? Multiple Vitamins-Minerals (PRESERVISION AREDS PO) Take 1 capsule by mouth daily.   07/20/2021  ? nitroGLYCERIN (NITROSTAT)  0.4 MG SL tablet Place 1 tablet (0.4 mg total) under the tongue every 5 (five) minutes as needed for chest pain (x 3  doses). 25 tablet 2 unk  ? omeprazole (PRILOSEC OTC) 20 MG tablet Take 20 mg by mouth daily.   07/21/2021  ? oxyCODONE (ROXICODONE) 15 MG immediate release tablet Take 7.5 mg by mouth every 8 (eight) hours as needed for pain.   Past Week  ? polyethylene glycol (MIRALAX / GLYCOLAX) 17 g packet Take 17 g by mouth 2 (two) times daily as needed for mild constipation.   unk  ? predniSONE (DELTASONE) 5 MG tablet Take 5 mg by mouth daily.   07/20/2021  ? rivaroxaban (XARELTO) 20 MG TABS tablet Take 1 tablet (20 mg total) by mouth daily at 6 PM. 30 tablet 2 07/20/2021 at 17:00  ? saccharomyces boulardii (FLORASTOR) 250 MG capsule Take 250 mg by mouth 2 (two) times daily.   07/20/2021  ? senna (SENOKOT) 8.6 MG TABS tablet Take 1 tablet by mouth at bedtime.   Past Week  ? VASCEPA 1 g CAPS Take 1 g by mouth daily.   07/20/2021  ? vitamin C (ASCORBIC ACID) 500 MG tablet Take 500 mg by mouth 2 (two) times daily.   07/20/2021  ? zinc sulfate 220 (50 Zn) MG capsule Take 220 mg by mouth daily.   07/20/2021  ? feeding supplement (ENSURE ENLIVE / ENSURE PLUS) LIQD Take 237 mLs by mouth 2 (two) times daily between meals. (Patient not taking: Reported on 07/21/2021)   Not Taking  ? furosemide (LASIX) 40 MG tablet Take 0.5 tablets (20 mg total) by mouth daily as needed for fluid or edema. (Patient not taking: Reported on 07/21/2021)   Not Taking  ? gabapentin (NEURONTIN) 100 MG capsule Take 3 capsules (300 mg total) by mouth 3 (three) times daily. (Patient not taking: Reported on 07/21/2021)   Not Taking  ? oxyCODONE (OXY IR/ROXICODONE) 5 MG immediate release tablet Take 1.5 tablets (7.5 mg total) by mouth every 8 (eight) hours as needed for severe pain. (Patient not taking: Reported on 07/21/2021) 5 tablet 0 Not Taking  ? ? ?Scheduled:  ? atorvastatin  20 mg Oral Daily  ? buPROPion ER  100 mg Oral BID  ? cycloSPORINE  1 drop Both Eyes BID  ? ferrous sulfate  325 mg Oral TID WC  ? gabapentin  300 mg Oral TID  ? icosapent Ethyl  1 g Oral Daily  ?  insulin aspart  0-15 Units Subcutaneous TID WC  ? insulin glargine-yfgn  30 Units Subcutaneous QHS  ? ketotifen  1 drop Both Eyes BID  ? loratadine  10 mg Oral Daily  ? magnesium oxide  400 mg Oral BID  ? metFORMI

## 2021-07-23 DIAGNOSIS — I480 Paroxysmal atrial fibrillation: Secondary | ICD-10-CM | POA: Diagnosis not present

## 2021-07-23 DIAGNOSIS — L02612 Cutaneous abscess of left foot: Secondary | ICD-10-CM

## 2021-07-23 DIAGNOSIS — R6510 Systemic inflammatory response syndrome (SIRS) of non-infectious origin without acute organ dysfunction: Secondary | ICD-10-CM

## 2021-07-23 DIAGNOSIS — J690 Pneumonitis due to inhalation of food and vomit: Secondary | ICD-10-CM

## 2021-07-23 DIAGNOSIS — L03032 Cellulitis of left toe: Secondary | ICD-10-CM

## 2021-07-23 DIAGNOSIS — N1831 Chronic kidney disease, stage 3a: Secondary | ICD-10-CM | POA: Diagnosis not present

## 2021-07-23 LAB — CBC WITH DIFFERENTIAL/PLATELET
Abs Immature Granulocytes: 0.08 10*3/uL — ABNORMAL HIGH (ref 0.00–0.07)
Basophils Absolute: 0.1 10*3/uL (ref 0.0–0.1)
Basophils Relative: 1 %
Eosinophils Absolute: 0 10*3/uL (ref 0.0–0.5)
Eosinophils Relative: 0 %
HCT: 25.2 % — ABNORMAL LOW (ref 36.0–46.0)
Hemoglobin: 7.3 g/dL — ABNORMAL LOW (ref 12.0–15.0)
Immature Granulocytes: 1 %
Lymphocytes Relative: 10 %
Lymphs Abs: 0.9 10*3/uL (ref 0.7–4.0)
MCH: 24.4 pg — ABNORMAL LOW (ref 26.0–34.0)
MCHC: 29 g/dL — ABNORMAL LOW (ref 30.0–36.0)
MCV: 84.3 fL (ref 80.0–100.0)
Monocytes Absolute: 1 10*3/uL (ref 0.1–1.0)
Monocytes Relative: 11 %
Neutro Abs: 7.3 10*3/uL (ref 1.7–7.7)
Neutrophils Relative %: 77 %
Platelets: 325 10*3/uL (ref 150–400)
RBC: 2.99 MIL/uL — ABNORMAL LOW (ref 3.87–5.11)
RDW: 19.3 % — ABNORMAL HIGH (ref 11.5–15.5)
WBC: 9.4 10*3/uL (ref 4.0–10.5)
nRBC: 0 % (ref 0.0–0.2)

## 2021-07-23 LAB — RENAL FUNCTION PANEL
Albumin: 1.7 g/dL — ABNORMAL LOW (ref 3.5–5.0)
Anion gap: 9 (ref 5–15)
BUN: 14 mg/dL (ref 8–23)
CO2: 19 mmol/L — ABNORMAL LOW (ref 22–32)
Calcium: 7.9 mg/dL — ABNORMAL LOW (ref 8.9–10.3)
Chloride: 109 mmol/L (ref 98–111)
Creatinine, Ser: 0.8 mg/dL (ref 0.44–1.00)
GFR, Estimated: >60 mL/min (ref >60)
Glucose, Bld: 119 mg/dL — ABNORMAL HIGH (ref 70–99)
Phosphorus: 3 mg/dL (ref 2.5–4.6)
Potassium: 4 mmol/L (ref 3.5–5.1)
Sodium: 137 mmol/L (ref 135–145)

## 2021-07-23 LAB — LACTIC ACID, PLASMA
Lactic Acid, Venous: 4.1 mmol/L (ref 0.5–1.9)
Lactic Acid, Venous: 3.7 mmol/L (ref 0.5–1.9)

## 2021-07-23 LAB — GLUCOSE, CAPILLARY
Glucose-Capillary: 139 mg/dL — ABNORMAL HIGH (ref 70–99)
Glucose-Capillary: 150 mg/dL — ABNORMAL HIGH (ref 70–99)
Glucose-Capillary: 159 mg/dL — ABNORMAL HIGH (ref 70–99)
Glucose-Capillary: 90 mg/dL (ref 70–99)
Glucose-Capillary: 93 mg/dL (ref 70–99)

## 2021-07-23 LAB — PROCALCITONIN: Procalcitonin: 0.24 ng/mL

## 2021-07-23 LAB — HEPARIN LEVEL (UNFRACTIONATED): Heparin Unfractionated: 0.36 IU/mL (ref 0.30–0.70)

## 2021-07-23 LAB — MAGNESIUM: Magnesium: 1.6 mg/dL — ABNORMAL LOW (ref 1.7–2.4)

## 2021-07-23 MED ORDER — MAGNESIUM SULFATE 2 GM/50ML IV SOLN
2.0000 g | Freq: Once | INTRAVENOUS | Status: AC
Start: 2021-07-23 — End: 2021-07-23
  Administered 2021-07-23: 2 g via INTRAVENOUS
  Filled 2021-07-23: qty 50

## 2021-07-23 MED ORDER — ONDANSETRON HCL 4 MG/2ML IJ SOLN
4.0000 mg | Freq: Four times a day (QID) | INTRAMUSCULAR | Status: DC | PRN
Start: 2021-07-23 — End: 2021-07-30
  Administered 2021-07-27: 4 mg via INTRAVENOUS
  Filled 2021-07-23 (×2): qty 2

## 2021-07-23 MED ORDER — LACTATED RINGERS IV SOLN: INTRAVENOUS | Status: DC

## 2021-07-23 MED ORDER — RIVAROXABAN 20 MG PO TABS
20.0000 mg | ORAL_TABLET | Freq: Every day | ORAL | Status: DC
  Administered 2021-07-23 – 2021-07-29 (×6): 20 mg via ORAL
  Filled 2021-07-23 (×6): qty 1

## 2021-07-23 MED ORDER — CHLORHEXIDINE GLUCONATE CLOTH 2 % EX PADS
6.0000 | MEDICATED_PAD | Freq: Every day | CUTANEOUS | Status: DC
  Administered 2021-07-23 – 2021-07-28 (×6): 6 via TOPICAL

## 2021-07-23 NOTE — Progress Notes (Signed)
ANTICOAGULATION CONSULT NOTE ? ?Pharmacy Consult for Heparin ?Indication: atrial fibrillation ? ?Allergies  ?Allergen Reactions  ? Promethazine Other (See Comments)  ?  Not listed on MAR, unknown reaction  ? ? ?Patient Measurements: ?Height: '5\' 7"'$  (170.2 cm) ?Weight: 63.5 kg (140 lb) ?IBW/kg (Calculated) : 61.6 ?Heparin Dosing Weight: 63.5 kg ? ?Vital Signs: ?Temp: 97.6 ?F (36.4 ?C) (04/21 0900) ?Temp Source: Axillary (04/21 0900) ?BP: 104/58 (04/21 0840) ?Pulse Rate: 92 (04/21 0800) ? ?Labs: ?Recent Labs  ?  07/21/21 ?1136 07/21/21 ?1136 07/21/21 ?1409 07/21/21 ?1700 07/21/21 ?2003 07/22/21 ?0601 07/22/21 ?1309 07/22/21 ?1526 07/22/21 ?2053 07/23/21 ?0232  ?HGB 10.1*  --   --   --   --  8.2*  --   --   --  7.3*  ?HCT 34.8*  --   --   --   --  27.4*  --   --   --  25.2*  ?PLT 446*  --   --   --   --  377  --   --   --  325  ?APTT  --   --   --  35 36 47*  --   --   --   --   ?LABPROT 17.4*  --   --   --   --   --   --   --   --   --   ?INR 1.4*  --   --   --   --   --   --   --   --   --   ?HEPARINUNFRC  --    < >  --   --   --  <0.10* <0.10*  --  0.49 0.36  ?CREATININE 0.90  --   --   --   --  0.69  --   --   --  0.80  ?TROPONINIHS 13  --  11  --   --   --  9 9  --   --   ? < > = values in this interval not displayed.  ? ? ? ?Estimated Creatinine Clearance: 58.2 mL/min (by C-G formula based on SCr of 0.8 mg/dL). ? ? ?Medical History: ?Past Medical History:  ?Diagnosis Date  ? Abnormal LFTs 07/24/2017  ? Actinic keratosis   ? Acute CVA (cerebrovascular accident) ([place])   ? Acute deep vein thrombosis (DVT) of left lower extremity ([place]) 05/06/2014  ? Anemia   ? hx of  ? Anemia of chronic renal failure, stage 3 (moderate) ([place]) 07/17/2015  ? Anticoagulant causing adverse effect in therapeutic use 07/17/2015  ? Anxiety   ? Arthritis   ? rheumatoid  ? Atrial fibrillation ([place])   ? a. persistent, she remains on Xarelto  ? Carpal tunnel syndrome   ? Chronic diastolic (congestive) heart failure (HCC)   ? a. 04/2016: echo showing  a preserved EF of 55-60% with mild MR. LA and RA midly dilated.   ? Edema   ? left leg at ankle resolved now  ? Gastroesophageal reflux disease   ? Hepatitis 1970  ? not sure what kind  ? Hiatal hernia   ? Hyperlipidemia   ? Hypertension   ? Iron deficiency anemia due to chronic blood loss 07/17/2015  ? Jaundice   ? age 56  ? Peripheral vascular disease ([place]) yrs ago  ? DVT left lower leg questionale told by 2 drs i had no clot, 1 md said i did  ? Rheumatoid arthritis(714.0)   ?  Right knee pain   ? Stroke [facility])   ? Type II diabetes mellitus ([place])   ? type2  ? ? ?Medications:  ?Medications Prior to Admission  ?Medication Sig Dispense Refill Last Dose  ? acetaminophen (TYLENOL) 650 MG CR tablet Take 1,300 mg by mouth in the morning and at bedtime.   07/20/2021  ? albuterol (VENTOLIN HFA) 108 (90 Base) MCG/ACT inhaler Inhale 2 puffs into the lungs every 4 (four) hours as needed for wheezing or shortness of breath.   unk  ? Amino Acids-Protein Hydrolys (FEEDING SUPPLEMENT, PRO-STAT 64,) LIQD Take 30 mLs by mouth in the morning.   07/20/2021  ? atorvastatin (LIPITOR) 20 MG tablet Take 20 mg by mouth daily.   07/20/2021  ? azelastine (OPTIVAR) 0.05 % ophthalmic solution Place 1 drop into both eyes 2 (two) times daily.   07/20/2021  ? buPROPion ER (WELLBUTRIN SR) 100 MG 12 hr tablet Take 1 tablet (100 mg total) by mouth 2 (two) times daily.   07/20/2021  ? Calcium Carbonate-Vit D-Min (CALCIUM 1200) 1200-1000 MG-UNIT CHEW Chew 1 tablet by mouth daily.   07/20/2021  ? carboxymethylcellulose (ARTIFICIAL TEARS) 1 % ophthalmic solution Place 2 drops into both eyes in the morning.   07/20/2021  ? cefTRIAXone (ROCEPHIN) 1 g injection Inject 1 g into the muscle See admin instructions. 1g intramuscularly once daily for 3 days   Past Week  ? cetirizine (ZYRTEC) 10 MG tablet Take 10 mg by mouth daily.   07/20/2021  ? cycloSPORINE (RESTASIS) 0.05 % ophthalmic emulsion Place 1 drop into both eyes 2 (two) times daily.   07/20/2021  ? dextrose 5 %  solution Inject 50 mLs into the vein continuous. 50cc/hr   Past Week  ? diclofenac Sodium (VOLTAREN) 1 % GEL Apply 2 g topically daily as needed for pain.   Past Month  ? diltiazem (CARDIZEM LA) 240 MG 24 hr tablet Take 1 tablet (240 mg total) by mouth 2 (two) times daily. 60 tablet 1 07/20/2021  ? Emollient (AQUAPHOR ADV THERAPY HEALING) OINT Apply 1 application. topically See admin instructions. To rash on face in the evening every Monday, Thursday   Past Week  ? Eyelid Cleansers (OCUSOFT LID SCRUB ORIGINAL) PADS Apply 1 application. topically in the morning and at bedtime. Both eyelids   07/20/2021  ? ferrous sulfate 325 (65 FE) MG EC tablet Take 325 mg by mouth 3 (three) times daily with meals.   07/20/2021  ? fluticasone-salmeterol (ADVAIR HFA) 115-21 MCG/ACT inhaler Inhale 2 puffs into the lungs 2 times daily at 12 noon and 4 pm.   07/20/2021  ? furosemide (LASIX) 20 MG tablet Take 20 mg by mouth daily as needed for edema.   unk  ? gabapentin (NEURONTIN) 300 MG capsule Take 300 mg by mouth 3 (three) times daily.   07/20/2021  ? insulin glargine (LANTUS) 100 UNIT/ML injection Inject 30 Units into the skin daily. Hold if BS is less than 150   Past Month  ? insulin lispro (HUMALOG) 100 UNIT/ML injection Inject 6 Units into the skin in the morning and at bedtime. Hold if BS <100   Past Week  ? magnesium oxide (MAG-OX) 400 MG tablet Take 400 mg by mouth 2 (two) times daily.   07/20/2021  ? Menthol-Zinc Oxide (CALMOSEPTINE) 0.44-20.6 % OINT Apply 1 application. topically every 8 (eight) hours as needed (protection/prevention).   unk  ? metFORMIN (GLUCOPHAGE) 500 MG tablet Take 1,000 mg by mouth 2 (two) times daily.   07/20/2021  ?  metoprolol tartrate (LOPRESSOR) 100 MG tablet Take 1 tablet (100 mg total) by mouth 2 (two) times daily.   07/20/2021 at 08:00  ? Multiple Vitamin (MULTIVITAMIN) tablet Take 1 tablet by mouth daily.   07/20/2021  ? Multiple Vitamins-Minerals (PRESERVISION AREDS PO) Take 1 capsule by mouth daily.    07/20/2021  ? nitroGLYCERIN (NITROSTAT) 0.4 MG SL tablet Place 1 tablet (0.4 mg total) under the tongue every 5 (five) minutes as needed for chest pain (x 3 doses). 25 tablet 2 unk  ? omeprazole (PRILOSEC OTC) 20 MG tablet Take 20 mg by mouth daily.   07/21/2021  ? oxyCODONE (ROXICODONE) 15 MG immediate release tablet Take 7.5 mg by mouth every 8 (eight) hours as needed for pain.   Past Week  ? polyethylene glycol (MIRALAX / GLYCOLAX) 17 g packet Take 17 g by mouth 2 (two) times daily as needed for mild constipation.   unk  ? predniSONE (DELTASONE) 5 MG tablet Take 5 mg by mouth daily.   07/20/2021  ? rivaroxaban (XARELTO) 20 MG TABS tablet Take 1 tablet (20 mg total) by mouth daily at 6 PM. 30 tablet 2 07/20/2021 at 17:00  ? saccharomyces boulardii (FLORASTOR) 250 MG capsule Take 250 mg by mouth 2 (two) times daily.   07/20/2021  ? senna (SENOKOT) 8.6 MG TABS tablet Take 1 tablet by mouth at bedtime.   Past Week  ? VASCEPA 1 g CAPS Take 1 g by mouth daily.   07/20/2021  ? vitamin C (ASCORBIC ACID) 500 MG tablet Take 500 mg by mouth 2 (two) times daily.   07/20/2021  ? zinc sulfate 220 (50 Zn) MG capsule Take 220 mg by mouth daily.   07/20/2021  ? feeding supplement (ENSURE ENLIVE / ENSURE PLUS) LIQD Take 237 mLs by mouth 2 (two) times daily between meals. (Patient not taking: Reported on 07/21/2021)   Not Taking  ? furosemide (LASIX) 40 MG tablet Take 0.5 tablets (20 mg total) by mouth daily as needed for fluid or edema. (Patient not taking: Reported on 07/21/2021)   Not Taking  ? gabapentin (NEURONTIN) 100 MG capsule Take 3 capsules (300 mg total) by mouth 3 (three) times daily. (Patient not taking: Reported on 07/21/2021)   Not Taking  ? oxyCODONE (OXY IR/ROXICODONE) 5 MG immediate release tablet Take 1.5 tablets (7.5 mg total) by mouth every 8 (eight) hours as needed for severe pain. (Patient not taking: Reported on 07/21/2021) 5 tablet 0 Not Taking  ? ? ?Scheduled:  ? atorvastatin  20 mg Oral Daily  ? buPROPion ER  100 mg  Oral BID  ? cycloSPORINE  1 drop Both Eyes BID  ? ferrous sulfate  325 mg Oral TID WC  ? gabapentin  300 mg Oral TID  ? icosapent Ethyl  1 g Oral Daily  ? insulin aspart  0-15 Units Subcutaneous TID W

## 2021-07-23 NOTE — Progress Notes (Signed)
Pt hasn't voided all shift. Bladder scan reveals over 425cc. MD aware. Foley to be placed for retention & strict Is & Os. ?

## 2021-07-23 NOTE — Progress Notes (Addendum)
PIV consult: Discussed with pt the need for additional site for incompatible medications. Pt stated "you ain't sticking me again today or any other day." Message to RN. ?RN was able to get pt to agree to PIV start. Same done. ?

## 2021-07-23 NOTE — Progress Notes (Signed)
?PROGRESS NOTE ? ? ? [name]  GHW:[id number] DOB: September 26, 1945 DOA: 07/21/2021 ?PCP: Aletha Halim., PA-C  ?Outpatient Specialists:  ? ? ?Brief Narrative:  ?Patient is a 76 year old female with advanced dementia, prior history midostaurin fibrillation on Xarelto, rheumatoid arthritis on chronic steroids, insulin-dependent diabetes mellitus, hypertension, chronic diastolic CHF, anemia secondary to CKD, chronic kidney disease stage IIIa, and chronic pressure injury of skin of right foot.  Patient was admitted with atrial fibrillation with rapid ventricular response.  I was asked to see patient emergently due to temperature of 100.5, increased respiratory rate, but stable blood pressure.  There are concerns for possible SIRS/early sepsis syndrome. ? ?07/22/2021: Patient seen alongside patient's nurse and rapid response nurse.  Patient is sleeping a bit deeply.  Apparently, patient was given 7.5 mg of oxycodone.  Patient is unable to give any history.  We will panculture patient, check lactic acid, procalcitonin, urinalysis and urine culture.  We will also get a chest x-ray, cardiac BNP, D-dimer and troponin.  We will start patient on vancomycin and cefepime.  Cellulitis of the left foot extremities noted, though, mild.  Heart rate seems to have improved significantly.  The heart rate is in the low 100s. ? ?07/23/2021: Patient seen.  Patient looks a lot better today.  Heart rate is better controlled but still mildly tachycardic (low 100s).  Left foot cellulitis is improving.  Chest x-ray revealed right medial basilar opacity.  Magnesium is 1.6.  Albumin is 1.7 (may have prognostic significance).  Continue antibiotics for now.  Continue to optimize heart rate control. ? ? ?Assessment & Plan: ?  ?Principal Problem: ?  A-fib ([place]) ?Active Problems: ?  Anemia of chronic renal failure, stage 3 (moderate) (HCC) ?  Hypomagnesemia ?  Hypokalemia ? ?SIRS/possible early sepsis: ?-Admitted with A-fib/RVR. ?-Ventricular rate  is improving. ?-Patient has been on IV Cardizem. ?-Temp of 100.5 ?F noted. ?-No leukocytosis. ?-Tachypnea reported. ?-Blood pressure stable. ?-Panculture patient. ?-Left foot (dorsum of left foot) mild cellulitis noted. ?-IV Vanco and cefepime ordered. ?-Check lactic acid and procalcitonin. ?-Also check D-dimer. ?07/23/2021: Patient is improving.  Patient is much better today.  Please see above documentation. ? ?A-fib with RVR: ?-Improving. ?-On heparin. ?-Check D-dimer, cardiac BNP, chest x-ray. ?-Heart rate is improving. ?07/23/2021: See above documentation. ? ?Hypomagnesemia: ?-Continue to monitor and replete. ?-Hypokalemia: ?-Continue to monitor and replete. ?07/23/2021: IV magnesium 2 g x 1 dose. ? ?Advanced dementia: ?-Supportive care. ? ?Rheumatoid arthritis: ?-Long-term prednisone. ? ?Insulin-dependent diabetes mellitus: ?-On subcutaneous Lantus 30 units at bedtime and sliding scale insulin coverage. ?-Continue to monitor. ? ?History of CKD 3A/anemia: ?-Continue to monitor closely. ? ? ?DVT prophylaxis: Heparin drip. ?Code Status: DO NOT RESUSCITATE ?Family Communication:  ?Disposition Plan:  ? ? ?Consultants:  ?Wound care ? ?Procedures:  ?None. ? ?Antimicrobials:  ?IV vancomycin ?IV cefepime ? ? ?Subjective: ?No history from patient. ?-Sleeping deeply. ? ?Objective: ?Vitals:  ? 07/23/21 0800 07/23/21 0840 07/23/21 0900 07/23/21 0940  ?BP: (!) 104/58 (!) 104/58  112/66  ?Pulse: 92     ?Resp: 16     ?Temp:   97.6 ?F (36.4 ?C)   ?TempSrc:   Axillary   ?SpO2: 100%     ?Weight:      ?Height:      ? ? ?Intake/Output Summary (Last 24 hours) at 07/23/2021 1730 ?Last data filed at 07/23/2021 0500 ?Gross per 24 hour  ?Intake 426.14 ml  ?Output 50 ml  ?Net 376.14 ml  ? ? ?Filed Weights  ?  07/21/21 1100  ?Weight: 63.5 kg  ? ? ?Examination: ? ?General exam: Appears calm and comfortable increased respiratory rate but not obvious respiratory distress. ?Respiratory system: Clear to auscultation.  ?Cardiovascular system: S1 &  S2, irregularly irregular.   ?Gastrointestinal system: Abdomen is nondistended, soft and nontender. No organomegaly or masses felt. Normal bowel sounds heard. ?Central nervous system: Sleeping quietly. ?Extremities: Redness of the dorsum of left foot.   ? ? ?Data Reviewed: I have personally reviewed following labs and imaging studies ? ?CBC: ?Recent Labs  ?Lab 07/21/21 ?1136 07/22/21 ?0601 07/23/21 ?0232  ?WBC 10.5 10.2 9.4  ?NEUTROABS 7.7  --  7.3  ?HGB 10.1* 8.2* 7.3*  ?HCT 34.8* 27.4* 25.2*  ?MCV 84.1 82.5 84.3  ?PLT 446* 377 325  ? ? ?Basic Metabolic Panel: ?Recent Labs  ?Lab 07/21/21 ?1136 07/22/21 ?0601 07/23/21 ?0232  ?NA 141 139 137  ?K 3.6 3.3* 4.0  ?CL 104 108 109  ?CO2 23 22 19*  ?GLUCOSE 146* 107* 119*  ?BUN '18 13 14  '$ ?CREATININE 0.90 0.69 0.80  ?CALCIUM 8.8* 7.8* 7.9*  ?MG 1.4* 1.8 1.6*  ?PHOS  --   --  3.0  ? ? ?GFR: ?Estimated Creatinine Clearance: 58.2 mL/min (by C-G formula based on SCr of 0.8 mg/dL). ?Liver Function Tests: ?Recent Labs  ?Lab 07/21/21 ?1136 07/23/21 ?0232  ?AST 29  --   ?ALT 21  --   ?ALKPHOS 277*  --   ?BILITOT 1.1  --   ?PROT 6.6  --   ?ALBUMIN 2.2* 1.7*  ? ? ?Recent Labs  ?Lab 07/21/21 ?1136  ?LIPASE 43  ? ? ?No results for input(s): AMMONIA in the last 168 hours. ?Coagulation Profile: ?Recent Labs  ?Lab 07/21/21 ?1136  ?INR 1.4*  ? ? ?Cardiac Enzymes: ?No results for input(s): CKTOTAL, CKMB, CKMBINDEX, TROPONINI in the last 168 hours. ?BNP (last 3 results) ?No results for input(s): PROBNP in the last 8760 hours. ?HbA1C: ?No results for input(s): HGBA1C in the last 72 hours. ?CBG: ?Recent Labs  ?Lab 07/22/21 ?2126 07/23/21 ?0021 07/23/21 ?6967 07/23/21 ?1213 07/23/21 ?1710  ?GLUCAP 150* 139* 159* 90 93  ? ? ?Lipid Profile: ?No results for input(s): CHOL, HDL, LDLCALC, TRIG, CHOLHDL, LDLDIRECT in the last 72 hours. ?Thyroid Function Tests: ?Recent Labs  ?  07/21/21 ?1700  ?TSH 1.096  ? ? ?Anemia Panel: ?No results for input(s): VITAMINB12, FOLATE, FERRITIN, TIBC, IRON, RETICCTPCT  in the last 72 hours. ?Urine analysis: ?   ?Component Value Date/Time  ? COLORURINE YELLOW 06/28/2021 1550  ? APPEARANCEUR CLEAR 06/28/2021 1550  ? LABSPEC 1.020 06/28/2021 1550  ? PHURINE 5.0 06/28/2021 1550  ? GLUCOSEU NEGATIVE 06/28/2021 1550  ? HGBUR NEGATIVE 06/28/2021 1550  ? [place] NEGATIVE 06/28/2021 1550  ? [place] NEGATIVE 06/28/2021 1550  ? PROTEINUR >=300 (A) 06/28/2021 1550  ? UROBILINOGEN 1.0 10/09/2014 0040  ? NITRITE NEGATIVE 06/28/2021 1550  ? LEUKOCYTESUR NEGATIVE 06/28/2021 1550  ? ?Sepsis Labs: ?'@LABRCNTIP'$ (procalcitonin:4,lacticidven:4) ? ?) ?Recent Results (from the past 240 hour(s))  ?Resp Panel by RT-PCR (Flu A&B, Covid) Nasopharyngeal Swab     Status: None  ? Collection Time: 07/21/21  2:09 PM  ? Specimen: Nasopharyngeal Swab; Nasopharyngeal(NP) swabs in vial transport medium  ?Result Value Ref Range Status  ? SARS Coronavirus 2 by RT PCR NEGATIVE NEGATIVE Final  ?  Comment: (NOTE) ?SARS-CoV-2 target nucleic acids are NOT DETECTED. ? ?The SARS-CoV-2 RNA is generally detectable in upper respiratory ?specimens during the acute phase of infection. The lowest ?concentration of SARS-CoV-2 viral  copies this assay can detect is ?138 copies/mL. A negative result does not preclude SARS-Cov-2 ?infection and should not be used as the sole basis for treatment or ?other patient management decisions. A negative result may occur with  ?improper specimen collection/handling, submission of specimen other ?than nasopharyngeal swab, presence of viral mutation(s) within the ?areas targeted by this assay, and inadequate number of viral ?copies(<138 copies/mL). A negative result must be combined with ?clinical observations, patient history, and epidemiological ?information. The expected result is Negative. ? ?Fact Sheet for Patients:  ?EntrepreneurPulse.com.au ? ?Fact Sheet for Healthcare Providers:  ?IncredibleEmployment.be ? ?This test is no t yet approved or cleared by  the [place] FDA and  ?has been authorized for detection and/or diagnosis of SARS-CoV-2 by ?FDA under an Emergency Use Authorization (EUA). This EUA will remain  ?in effect (meaning this test can b

## 2021-07-23 NOTE — Care Management Important Message (Signed)
Important Message ? ?Patient Details  ?Name: [name] ?MRN: [id number] ?Date of Birth: 1945-07-16 ? ? ?Medicare Important Message Given:  Yes ? ? ? ? ?Shelda Altes ?07/23/2021, 9:55 AM ?

## 2021-07-24 DIAGNOSIS — I4891 Unspecified atrial fibrillation: Secondary | ICD-10-CM

## 2021-07-24 DIAGNOSIS — F03911 Unspecified dementia, unspecified severity, with agitation: Secondary | ICD-10-CM

## 2021-07-24 DIAGNOSIS — R6510 Systemic inflammatory response syndrome (SIRS) of non-infectious origin without acute organ dysfunction: Secondary | ICD-10-CM

## 2021-07-24 DIAGNOSIS — A419 Sepsis, unspecified organism: Secondary | ICD-10-CM

## 2021-07-24 LAB — CBC
HCT: 22.7 % — ABNORMAL LOW (ref 36.0–46.0)
Hemoglobin: 6.8 g/dL — CL (ref 12.0–15.0)
MCH: 25 pg — ABNORMAL LOW (ref 26.0–34.0)
MCHC: 30 g/dL (ref 30.0–36.0)
MCV: 83.5 fL (ref 80.0–100.0)
Platelets: 358 10*3/uL (ref 150–400)
RBC: 2.72 MIL/uL — ABNORMAL LOW (ref 3.87–5.11)
RDW: 19.4 % — ABNORMAL HIGH (ref 11.5–15.5)
WBC: 11 10*3/uL — ABNORMAL HIGH (ref 4.0–10.5)
nRBC: 0 % (ref 0.0–0.2)

## 2021-07-24 LAB — GLUCOSE, CAPILLARY
Glucose-Capillary: 91 mg/dL (ref 70–99)
Glucose-Capillary: 60 mg/dL — ABNORMAL LOW (ref 70–99)
Glucose-Capillary: 87 mg/dL (ref 70–99)
Glucose-Capillary: 73 mg/dL (ref 70–99)

## 2021-07-24 LAB — COMPREHENSIVE METABOLIC PANEL
ALT: 16 U/L (ref 0–44)
AST: 20 U/L (ref 15–41)
Albumin: 1.7 g/dL — ABNORMAL LOW (ref 3.5–5.0)
Alkaline Phosphatase: 179 U/L — ABNORMAL HIGH (ref 38–126)
Anion gap: 11 (ref 5–15)
BUN: 16 mg/dL (ref 8–23)
CO2: 18 mmol/L — ABNORMAL LOW (ref 22–32)
Calcium: 8.3 mg/dL — ABNORMAL LOW (ref 8.9–10.3)
Chloride: 108 mmol/L (ref 98–111)
Creatinine, Ser: 0.85 mg/dL (ref 0.44–1.00)
GFR, Estimated: >60 mL/min (ref >60)
Glucose, Bld: 70 mg/dL (ref 70–99)
Potassium: 3.9 mmol/L (ref 3.5–5.1)
Sodium: 137 mmol/L (ref 135–145)
Total Bilirubin: 0.8 mg/dL (ref 0.3–1.2)
Total Protein: 4.9 g/dL — ABNORMAL LOW (ref 6.5–8.1)

## 2021-07-24 LAB — LACTIC ACID, PLASMA: Lactic Acid, Venous: 3.2 mmol/L (ref 0.5–1.9)

## 2021-07-24 LAB — HEMOGLOBIN AND HEMATOCRIT, BLOOD
HCT: 26.1 % — ABNORMAL LOW (ref 36.0–46.0)
Hemoglobin: 7.8 g/dL — ABNORMAL LOW (ref 12.0–15.0)

## 2021-07-24 LAB — PROCALCITONIN: Procalcitonin: 0.21 ng/mL

## 2021-07-24 LAB — PREPARE RBC (CROSSMATCH)

## 2021-07-24 MED ORDER — SODIUM CHLORIDE 0.9% IV SOLUTION: Freq: Once | INTRAVENOUS | Status: DC

## 2021-07-24 NOTE — Progress Notes (Signed)
?PROGRESS NOTE ? ? ? [name]  YKZ:[id number] DOB: 12-Jan-1946 DOA: 07/21/2021 ?PCP: Aletha Halim., PA-C  ?Outpatient Specialists:  ? ? ?Brief Narrative:  ?Patient is a 76 year old female with advanced dementia, prior history midostaurin fibrillation on Xarelto, rheumatoid arthritis on chronic steroids, insulin-dependent diabetes mellitus, hypertension, chronic diastolic CHF, anemia secondary to CKD, chronic kidney disease stage IIIa, and chronic pressure injury of skin of right foot.  Patient was admitted with atrial fibrillation with rapid ventricular response.  I was asked to see patient emergently due to temperature of 100.5, increased respiratory rate, but stable blood pressure.  There are concerns for possible SIRS/early sepsis syndrome. ? ?07/22/2021: Patient seen alongside patient's nurse and rapid response nurse.  Patient is sleeping a bit deeply.  Apparently, patient was given 7.5 mg of oxycodone.  Patient is unable to give any history.  We will panculture patient, check lactic acid, procalcitonin, urinalysis and urine culture.  We will also get a chest x-ray, cardiac BNP, D-dimer and troponin.  We will start patient on vancomycin and cefepime.  Cellulitis of the left foot extremities noted, though, mild.  Heart rate seems to have improved significantly.  The heart rate is in the low 100s. ? ?07/23/2021: Patient seen.  Patient looks a lot better today.  Heart rate is better controlled but still mildly tachycardic (low 100s).  Left foot cellulitis is improving.  Chest x-ray revealed right medial basilar opacity.  Magnesium is 1.6.  Albumin is 1.7 (may have prognostic significance).  Continue antibiotics for now.  Continue to optimize heart rate control. ? ?07/24/2021: Patient seen alongside patient's son.  Patient was transfused with 1 unit of packed red blood cells overnight.  Apparently, hemoglobin dropped to 6.8.  Patient continues to improve clinically.  RVR process.  No documented  fever. ? ? ?Assessment & Plan: ?  ?Principal Problem: ?  A-fib ([place]) ?Active Problems: ?  Anemia of chronic renal failure, stage 3 (moderate) (HCC) ?  Hypomagnesemia ?  Hypokalemia ? ?SIRS/possible early sepsis: ?-Admitted with A-fib/RVR. ?-Ventricular rate is improving. ?-Patient has been on IV Cardizem. ?-Temp of 100.5 ?F noted. ?-No leukocytosis. ?-Tachypnea reported. ?-Blood pressure stable. ?-Panculture patient. ?-Left foot (dorsum of left foot) mild cellulitis noted. ?-IV Vanco and cefepime ordered. ?-Check lactic acid and procalcitonin. ?-Also check D-dimer. ?07/24/2021: Patient continues to improve.   ? ?Community-acquired pneumonia/aspiration pneumonia: ?-See chest x-ray findings. ?-Continue IV antibiotics. ? ?A-fib with RVR: ?-Improving. ?-On heparin. ?-Check D-dimer, cardiac BNP, chest x-ray. ?-Heart rate is improving. ?07/23/2021: See above documentation. ?07/24/2021: RVR persists.  Continue to optimize. ? ?Hypomagnesemia: ?-Continue to monitor and replete. ?-Hypokalemia: ?-Continue to monitor and replete. ?07/23/2021: IV magnesium 2 g x 1 dose. ?07/24/2021: Repeat magnesium level in the morning. ? ?Advanced dementia: ?-Supportive care. ? ?Rheumatoid arthritis: ?-Long-term prednisone. ? ?Insulin-dependent diabetes mellitus: ?-On subcutaneous Lantus 30 units at bedtime and sliding scale insulin coverage. ?-Continue to monitor. ? ?History of CKD 3A/anemia versus AKI: ?-Continue to monitor closely. ?07/24/2021: Renal function is back to normal. ? ? ?DVT prophylaxis: Heparin drip. ?Code Status: DO NOT RESUSCITATE ?Family Communication:  ?Disposition Plan:  ? ? ?Consultants:  ?Wound care ? ?Procedures:  ?None. ? ?Antimicrobials:  ?IV vancomycin ?IV cefepime ? ? ?Subjective: ?No new complaints. ?No fever or chills. ?No shortness of breath. ?No chest pain. ?No palpitations. ? ?Objective: ?Vitals:  ? 07/24/21 1122 07/24/21 1139 07/24/21 1143 07/24/21 1428  ?BP: (!) 110/58 (!) 109/57 (!) 106/56 (!) 115/96  ?Pulse: (!)  112 (!) 116 [name])  113 (!) 118  ?Resp: '16 16 16 16  '$ ?Temp: 99 ?F (37.2 ?C)  99.2 ?F (37.3 ?C) 98.6 ?F (37 ?C)  ?TempSrc: Axillary  Axillary Axillary  ?SpO2: 100%  100% 100%  ?Weight:      ?Height:      ? ? ?Intake/Output Summary (Last 24 hours) at 07/24/2021 1541 ?Last data filed at 07/24/2021 1420 ?Gross per 24 hour  ?Intake 344 ml  ?Output 1925 ml  ?Net -1581 ml  ? ? ?Filed Weights  ? 07/21/21 1100  ?Weight: 63.5 kg  ? ? ?Examination: ? ?General exam: Appears calm and comfortable.  Patient is not in any distress.  I ?Respiratory system: Clear to auscultation.  ?Cardiovascular system: S1 & S2, irregularly irregular.   ?Gastrointestinal system: Abdomen is nondistended, soft and nontender. No organomegaly or masses felt. Normal bowel sounds heard. ?Central nervous system: Sleeping quietly. ?Extremities: Redness of the dorsum of left foot has improved significantly.   ? ? ?Data Reviewed: I have personally reviewed following labs and imaging studies ? ?CBC: ?Recent Labs  ?Lab 07/21/21 ?1136 07/22/21 ?0601 07/23/21 ?0232 07/24/21 ?0259 07/24/21 ?0830  ?WBC 10.5 10.2 9.4 11.0*  --   ?NEUTROABS 7.7  --  7.3  --   --   ?HGB 10.1* 8.2* 7.3* 6.8* 7.8*  ?HCT 34.8* 27.4* 25.2* 22.7* 26.1*  ?MCV 84.1 82.5 84.3 83.5  --   ?PLT 446* 377 325 358  --   ? ? ?Basic Metabolic Panel: ?Recent Labs  ?Lab 07/21/21 ?1136 07/22/21 ?0601 07/23/21 ?0232 07/24/21 ?3762  ?NA 141 139 137 137  ?K 3.6 3.3* 4.0 3.9  ?CL 104 108 109 108  ?CO2 23 22 19* 18*  ?GLUCOSE 146* 107* 119* 70  ?BUN '18 13 14 16  '$ ?CREATININE 0.90 0.69 0.80 0.85  ?CALCIUM 8.8* 7.8* 7.9* 8.3*  ?MG 1.4* 1.8 1.6*  --   ?PHOS  --   --  3.0  --   ? ? ?GFR: ?Estimated Creatinine Clearance: 54.8 mL/min (by C-G formula based on SCr of 0.85 mg/dL). ?Liver Function Tests: ?Recent Labs  ?Lab 07/21/21 ?1136 07/23/21 ?0232 07/24/21 ?8315  ?AST 29  --  20  ?ALT 21  --  16  ?ALKPHOS 277*  --  179*  ?BILITOT 1.1  --  0.8  ?PROT 6.6  --  4.9*  ?ALBUMIN 2.2* 1.7* 1.7*  ? ? ?Recent Labs  ?Lab  07/21/21 ?1136  ?LIPASE 43  ? ? ?No results for input(s): AMMONIA in the last 168 hours. ?Coagulation Profile: ?Recent Labs  ?Lab 07/21/21 ?1136  ?INR 1.4*  ? ? ?Cardiac Enzymes: ?No results for input(s): CKTOTAL, CKMB, CKMBINDEX, TROPONINI in the last 168 hours. ?BNP (last 3 results) ?No results for input(s): PROBNP in the last 8760 hours. ?HbA1C: ?No results for input(s): HGBA1C in the last 72 hours. ?CBG: ?Recent Labs  ?Lab 07/23/21 ?1761 07/23/21 ?1213 07/23/21 ?1710 07/24/21 ?0744 07/24/21 ?1102  ?GLUCAP 159* 90 93 91 60*  ? ? ?Lipid Profile: ?No results for input(s): CHOL, HDL, LDLCALC, TRIG, CHOLHDL, LDLDIRECT in the last 72 hours. ?Thyroid Function Tests: ?Recent Labs  ?  07/21/21 ?1700  ?TSH 1.096  ? ? ?Anemia Panel: ?No results for input(s): VITAMINB12, FOLATE, FERRITIN, TIBC, IRON, RETICCTPCT in the last 72 hours. ?Urine analysis: ?   ?Component Value Date/Time  ? COLORURINE YELLOW 06/28/2021 1550  ? APPEARANCEUR CLEAR 06/28/2021 1550  ? LABSPEC 1.020 06/28/2021 1550  ? PHURINE 5.0 06/28/2021 1550  ? GLUCOSEU NEGATIVE 06/28/2021 1550  ? HGBUR  NEGATIVE 06/28/2021 1550  ? [place] NEGATIVE 06/28/2021 1550  ? [place] NEGATIVE 06/28/2021 1550  ? PROTEINUR >=300 (A) 06/28/2021 1550  ? UROBILINOGEN 1.0 10/09/2014 0040  ? NITRITE NEGATIVE 06/28/2021 1550  ? LEUKOCYTESUR NEGATIVE 06/28/2021 1550  ? ?Sepsis Labs: ?'@LABRCNTIP'$ (procalcitonin:4,lacticidven:4) ? ?) ?Recent Results (from the past 240 hour(s))  ?Resp Panel by RT-PCR (Flu A&B, Covid) Nasopharyngeal Swab     Status: None  ? Collection Time: 07/21/21  2:09 PM  ? Specimen: Nasopharyngeal Swab; Nasopharyngeal(NP) swabs in vial transport medium  ?Result Value Ref Range Status  ? SARS Coronavirus 2 by RT PCR NEGATIVE NEGATIVE Final  ?  Comment: (NOTE) ?SARS-CoV-2 target nucleic acids are NOT DETECTED. ? ?The SARS-CoV-2 RNA is generally detectable in upper respiratory ?specimens during the acute phase of infection. The lowest ?concentration of SARS-CoV-2  viral copies this assay can detect is ?138 copies/mL. A negative result does not preclude SARS-Cov-2 ?infection and should not be used as the sole basis for treatment or ?other patient management decisions. A negative result

## 2021-07-24 NOTE — Progress Notes (Addendum)
TRH night cross cover note: ? ?I was notified by RN of updated hemoglobin value of 6.8.  This morning CBC. ? ?Per my chart review, this is relative to hemoglobin value of 7.3 yesterday a.m. and compared to hemoglobin of 8.2 on the morning of 07/22/2021.  Most recent vital signs reflect blood pressure of 103/80, with heart rate 67. ? ?Subsequently placed order for type and screen as well as transfusion of 1 unit PRBC to be administered every 3 hours.  Additionally, I have ordered repeat H&H to be checked at 9 AM this morning. ? ?Update: in setting of patient's advanced dementia, RN has been trying to reach patient's son (POA) for consent for aforementioned PRBC transfusion.  Unable to reach patient's son thus far.  Voicemail left and awaiting response from son.  ? ? ?Babs Bertin, DO ?Hospitalist ? ?

## 2021-07-24 NOTE — Progress Notes (Signed)
Pt had an episode of emesis that was green in color. Pt's abdomen was non-tender to the touch with active bowel sounds observed in all four quadrants. Howerter, MD made aware. See new orders.  ? ?Elaina Hoops, RN ? ?

## 2021-07-24 NOTE — Progress Notes (Signed)
Critical value: ?Hgb: 6.8 notified by this RN at 0330; Howerter, MD made aware. Awaiting new orders.  ? ?Elaina Hoops, RN ? ?

## 2021-07-24 NOTE — Progress Notes (Signed)
Pt's orientation level has improved to A&O x 3 with history of advanced dementia. This RN attempted to contact the pt's son [name] in order to obtain an informed consent for blood and/or blood products transfusion and a voicemail was left for him to call the main unit ([address] [place]) at his earliest convenience.  ? ?Elaina Hoops, RN ? ?

## 2021-07-24 NOTE — Progress Notes (Signed)
TRH night cross cover note: ? ?I was notified by RN that the patient's temperature of 100.5, which is reportedly new for her.  Additional vital signs also notable for mild tachycardia.  Additionally, patient with some intermittent nausea resulting in an episode of nonbloody emesis, reportedly had a green coloration associated with it.  The patient denies any associated abdominal discomfort, and is otherwise without acute complaint at this time. ? ?Per my chart review, including review of most recent rounding hospitalist progress note, patient with suspected left foot cellulitis on interval IV vancomycin and cefepime.  Blood cultures x2 were collected on 07/22/2021, and show no growth to date. Will also check urine cx.  Chest x-ray on 07/22/2021 favored atelectasis relative to infiltrate, consistent with associated procalcitonin result, which was nonelevated.  ? ?Per chart review, elevated lactate of 3.1 earlier today noted, with repeat lactate trending up to 4.1 around 4 PM.  In the context of patient's fever, tachycardia, and suspected underlying left foot cellulitis, criteria met for sepsis, with criteria met for patient's sepsis to be considered severe in nature on the basis of elevated lactate.  Given elevation of her lactate greater than 4.0, criteria are also met for administration of a 30 mL/kg IVF bolus.  However, given the patient's age, atrial fibrillation associated with relative decrease in ejection fraction as a consequence of loss of atrial kick, along with current normotensive blood pressures, will refrain from this volume of IV fluid bolus in order to reduce risk for ensuing acute volume overload.  At this time, the patient is not receiving IV fluids.   ? ?I have placed order to initiate continuous LR at 125 cc/h, and will follow for repeat lactate level, which has been ordered to be checked within 3 hours of most recent prior result.  Continue existing broad-spectrum IV antibiotics in the form of  current IV vancomycin and cefepime.  Additionally, in the setting of this fever as well as reported potential green appearance associated with the patient's emesis, albeit in the absence of any reported abdominal pain, will also check CMP.  Repeat CBC is also been ordered for the morning as has repeat procalcitonin level. Prn Zofran ordered. Prn acetaminophen. ? ?Update: Repeat lactate trending down, with updated value of 3.7 relative to most recent prior value of 4.1.  Continue existing IVF's, as above. Additionally, I have ordered repeat lactate with tomorrow morning labs. ? ? ? ? ?Babs Bertin, DO ?Hospitalist ? ?

## 2021-07-25 ENCOUNTER — Inpatient Hospital Stay (HOSPITAL_COMMUNITY): Payer: 59

## 2021-07-25 DIAGNOSIS — I480 Paroxysmal atrial fibrillation: Secondary | ICD-10-CM | POA: Diagnosis not present

## 2021-07-25 DIAGNOSIS — A419 Sepsis, unspecified organism: Secondary | ICD-10-CM | POA: Diagnosis not present

## 2021-07-25 DIAGNOSIS — R6510 Systemic inflammatory response syndrome (SIRS) of non-infectious origin without acute organ dysfunction: Secondary | ICD-10-CM | POA: Diagnosis not present

## 2021-07-25 LAB — GLUCOSE, CAPILLARY
Glucose-Capillary: 56 mg/dL — ABNORMAL LOW (ref 70–99)
Glucose-Capillary: 158 mg/dL — ABNORMAL HIGH (ref 70–99)
Glucose-Capillary: 127 mg/dL — ABNORMAL HIGH (ref 70–99)
Glucose-Capillary: 94 mg/dL (ref 70–99)
Glucose-Capillary: 131 mg/dL — ABNORMAL HIGH (ref 70–99)

## 2021-07-25 LAB — CBC
HCT: 26.8 % — ABNORMAL LOW (ref 36.0–46.0)
Hemoglobin: 8 g/dL — ABNORMAL LOW (ref 12.0–15.0)
MCH: 25.6 pg — ABNORMAL LOW (ref 26.0–34.0)
MCHC: 29.9 g/dL — ABNORMAL LOW (ref 30.0–36.0)
MCV: 85.6 fL (ref 80.0–100.0)
Platelets: 341 10*3/uL (ref 150–400)
RBC: 3.13 MIL/uL — ABNORMAL LOW (ref 3.87–5.11)
RDW: 18.7 % — ABNORMAL HIGH (ref 11.5–15.5)
WBC: 9 10*3/uL (ref 4.0–10.5)
nRBC: 0 % (ref 0.0–0.2)

## 2021-07-25 NOTE — Progress Notes (Addendum)
Hypoglycemic Event ? ?CBG: 56 ? ?Treatment: D50 25 ml ? ?Symptoms: None ? ?Follow-up CBG: Time:916 CBG Result:158 ? ?Possible Reasons for Event: poor intake ? ?Comments/MD notified:833 ? ? ? ?[place] Ranger, RN ? ? ?

## 2021-07-25 NOTE — Progress Notes (Signed)
?PROGRESS NOTE ? ? ? [name]  QPR:[id number] DOB: 1945/10/06 DOA: 07/21/2021 ?PCP: Aletha Halim., PA-C  ?Outpatient Specialists:  ? ? ?Brief Narrative:  ?Patient is a 76 year old female with advanced dementia, prior history midostaurin fibrillation on Xarelto, rheumatoid arthritis on chronic steroids, insulin-dependent diabetes mellitus, hypertension, chronic diastolic CHF, anemia secondary to CKD, chronic kidney disease stage IIIa, and chronic pressure injury of skin of right foot.  Patient was admitted with atrial fibrillation with rapid ventricular response.  I was asked to see patient emergently due to temperature of 100.5, increased respiratory rate, but stable blood pressure.  There are concerns for possible SIRS/early sepsis syndrome. ? ?07/22/2021: Patient seen alongside patient's nurse and rapid response nurse.  Patient is sleeping a bit deeply.  Apparently, patient was given 7.5 mg of oxycodone.  Patient is unable to give any history.  We will panculture patient, check lactic acid, procalcitonin, urinalysis and urine culture.  We will also get a chest x-ray, cardiac BNP, D-dimer and troponin.  We will start patient on vancomycin and cefepime.  Cellulitis of the left foot extremities noted, though, mild.  Heart rate seems to have improved significantly.  The heart rate is in the low 100s. ? ?07/23/2021: Patient seen.  Patient looks a lot better today.  Heart rate is better controlled but still mildly tachycardic (low 100s).  Left foot cellulitis is improving.  Chest x-ray revealed right medial basilar opacity.  Magnesium is 1.6.  Albumin is 1.7 (may have prognostic significance).  Continue antibiotics for now.  Continue to optimize heart rate control. ? ?07/24/2021: Patient seen alongside patient's son.  Patient was transfused with 1 unit of packed red blood cells overnight.  Apparently, hemoglobin dropped to 6.8.  Patient continues to improve clinically.  RVR process.  No documented  fever. ? ?07/25/2021: Patient continues to improve slowly. ? ? ?Assessment & Plan: ?  ?Principal Problem: ?  A-fib ([place]) ?Active Problems: ?  Anemia of chronic renal failure, stage 3 (moderate) (HCC) ?  Atrial fibrillation with RVR ([place]) ?  Hypomagnesemia ?  Hypokalemia ?  Sepsis ([place]) ?  Dementia with agitation ([place]) ? ?SIRS/possible early sepsis: ?-Admitted with A-fib/RVR. ?-Ventricular rate is improving. ?-Patient has been on IV Cardizem. ?-Temp of 100.5 ?F noted. ?-No leukocytosis. ?-Tachypnea reported. ?-Blood pressure stable. ?-Panculture patient. ?-Left foot (dorsum of left foot) mild cellulitis noted. ?-IV Vanco and cefepime ordered. ?-Check lactic acid and procalcitonin. ?-Also check D-dimer. ?07/24/2021: Patient continues to improve.   ? ?Community-acquired pneumonia/aspiration pneumonia: ?-See chest x-ray findings. ?-Continue IV antibiotics. ? ?A-fib with RVR: ?-Improving. ?-On heparin. ?-Check D-dimer, cardiac BNP, chest x-ray. ?-Heart rate is improving. ?07/25/2021: RVR persists.  Continue to optimize. ? ?Hypomagnesemia: ?-Continue to monitor and replete. ?-Hypokalemia: ?-Continue to monitor and replete. ?07/23/2021: IV magnesium 2 g x 1 dose. ?07/24/2021: Repeat magnesium level in the morning. ?07/25/2021: No new magnesium level visualized. ? ?Advanced dementia: ?-Supportive care. ? ?Rheumatoid arthritis: ?-Long-term prednisone. ? ?Insulin-dependent diabetes mellitus: ?-On subcutaneous Lantus 30 units at bedtime and sliding scale insulin coverage. ?-Continue to monitor. ? ?History of CKD 3A/anemia versus AKI: ?-Continue to monitor closely. ?07/24/2021: Renal function is back to normal. ? ? ?DVT prophylaxis: Heparin drip. ?Code Status: DO NOT RESUSCITATE ?Family Communication:  ?Disposition Plan:  ? ? ?Consultants:  ?Wound care ? ?Procedures:  ?None. ? ?Antimicrobials:  ?IV vancomycin ?IV cefepime ? ? ?Subjective: ?No new complaints. ?No fever or chills. ?No shortness of breath. ?No chest pain. ?No  palpitations. ? ?Objective: ?Vitals:  ?  07/25/21 0753 07/25/21 1138 07/25/21 1240 07/25/21 1356  ?BP:  135/78 128/72 106/87  ?Pulse: (!) 110 (!) 119 (!) 102 (!) 126  ?Resp: 18 18    ?Temp:  98.8 ?F (37.1 ?C)    ?TempSrc:  Axillary    ?SpO2: 99% 98% 98% 100%  ?Weight:      ?Height:      ? ? ?Intake/Output Summary (Last 24 hours) at 07/25/2021 1649 ?Last data filed at 07/25/2021 0402 ?Gross per 24 hour  ?Intake 3345.72 ml  ?Output 650 ml  ?Net 2695.72 ml  ? ? ?Filed Weights  ? 07/21/21 1100  ?Weight: 63.5 kg  ? ? ?Examination: ? ?General exam: Appears calm and comfortable.  Patient is not in any distress.  I ?Respiratory system: Clear to auscultation.  ?Cardiovascular system: S1 & S2, irregularly irregular.   ?Gastrointestinal system: Abdomen is nondistended, soft and nontender. No organomegaly or masses felt. Normal bowel sounds heard. ?Central nervous system: Sleeping quietly. ?Extremities: Redness of the dorsum of left foot has improved significantly.   ? ? ?Data Reviewed: I have personally reviewed following labs and imaging studies ? ?CBC: ?Recent Labs  ?Lab 07/21/21 ?1136 07/22/21 ?0601 07/23/21 ?0232 07/24/21 ?0259 07/24/21 ?0830 07/25/21 ?0226  ?WBC 10.5 10.2 9.4 11.0*  --  9.0  ?NEUTROABS 7.7  --  7.3  --   --   --   ?HGB 10.1* 8.2* 7.3* 6.8* 7.8* 8.0*  ?HCT 34.8* 27.4* 25.2* 22.7* 26.1* 26.8*  ?MCV 84.1 82.5 84.3 83.5  --  85.6  ?PLT 446* 377 325 358  --  341  ? ? ?Basic Metabolic Panel: ?Recent Labs  ?Lab 07/21/21 ?1136 07/22/21 ?0601 07/23/21 ?0232 07/24/21 ?4098  ?NA 141 139 137 137  ?K 3.6 3.3* 4.0 3.9  ?CL 104 108 109 108  ?CO2 23 22 19* 18*  ?GLUCOSE 146* 107* 119* 70  ?BUN '18 13 14 16  '$ ?CREATININE 0.90 0.69 0.80 0.85  ?CALCIUM 8.8* 7.8* 7.9* 8.3*  ?MG 1.4* 1.8 1.6*  --   ?PHOS  --   --  3.0  --   ? ? ?GFR: ?Estimated Creatinine Clearance: 54.8 mL/min (by C-G formula based on SCr of 0.85 mg/dL). ?Liver Function Tests: ?Recent Labs  ?Lab 07/21/21 ?1136 07/23/21 ?0232 07/24/21 ?1191  ?AST 29  --  20  ?ALT  21  --  16  ?ALKPHOS 277*  --  179*  ?BILITOT 1.1  --  0.8  ?PROT 6.6  --  4.9*  ?ALBUMIN 2.2* 1.7* 1.7*  ? ? ?Recent Labs  ?Lab 07/21/21 ?1136  ?LIPASE 43  ? ? ?No results for input(s): AMMONIA in the last 168 hours. ?Coagulation Profile: ?Recent Labs  ?Lab 07/21/21 ?1136  ?INR 1.4*  ? ? ?Cardiac Enzymes: ?No results for input(s): CKTOTAL, CKMB, CKMBINDEX, TROPONINI in the last 168 hours. ?BNP (last 3 results) ?No results for input(s): PROBNP in the last 8760 hours. ?HbA1C: ?No results for input(s): HGBA1C in the last 72 hours. ?CBG: ?Recent Labs  ?Lab 07/24/21 ?1633 07/24/21 ?2140 07/25/21 ?4782 07/25/21 ?9562 07/25/21 ?1136  ?GLUCAP 87 73 56* 158* 127*  ? ? ?Lipid Profile: ?No results for input(s): CHOL, HDL, LDLCALC, TRIG, CHOLHDL, LDLDIRECT in the last 72 hours. ?Thyroid Function Tests: ?No results for input(s): TSH, T4TOTAL, FREET4, T3FREE, THYROIDAB in the last 72 hours. ? ?Anemia Panel: ?No results for input(s): VITAMINB12, FOLATE, FERRITIN, TIBC, IRON, RETICCTPCT in the last 72 hours. ?Urine analysis: ?   ?Component Value Date/Time  ? COLORURINE YELLOW 06/28/2021 1550  ?  APPEARANCEUR CLEAR 06/28/2021 1550  ? LABSPEC 1.020 06/28/2021 1550  ? PHURINE 5.0 06/28/2021 1550  ? GLUCOSEU NEGATIVE 06/28/2021 1550  ? HGBUR NEGATIVE 06/28/2021 1550  ? [place] NEGATIVE 06/28/2021 1550  ? [place] NEGATIVE 06/28/2021 1550  ? PROTEINUR >=300 (A) 06/28/2021 1550  ? UROBILINOGEN 1.0 10/09/2014 0040  ? NITRITE NEGATIVE 06/28/2021 1550  ? LEUKOCYTESUR NEGATIVE 06/28/2021 1550  ? ?Sepsis Labs: ?'@LABRCNTIP'$ (procalcitonin:4,lacticidven:4) ? ?) ?Recent Results (from the past 240 hour(s))  ?Resp Panel by RT-PCR (Flu A&B, Covid) Nasopharyngeal Swab     Status: None  ? Collection Time: 07/21/21  2:09 PM  ? Specimen: Nasopharyngeal Swab; Nasopharyngeal(NP) swabs in vial transport medium  ?Result Value Ref Range Status  ? SARS Coronavirus 2 by RT PCR NEGATIVE NEGATIVE Final  ?  Comment: (NOTE) ?SARS-CoV-2 target nucleic acids are  NOT DETECTED. ? ?The SARS-CoV-2 RNA is generally detectable in upper respiratory ?specimens during the acute phase of infection. The lowest ?concentration of SARS-CoV-2 viral copies this assay can detect is ?138 copies/mL. A

## 2021-07-25 NOTE — Progress Notes (Signed)
TRH night cross cover note: ? ?I was contacted by RN with request for clarification regarding the patient's basal insulin.  Specifically, RN conveyed the patient has exhibited hypoglycemia on the morning of 07/24/2021 after receiving 30 units of insulin glargine at bedtime for 07/23/21.  She reports that his hypoglycemia improved following daytime dose D50. per chart review, CBG noted to be 60 on the morning of 07/24/2021.  ? ?Subsequently, I asked that the patient's evening dose of insulin glargine 30 units sq be held this evening.  will follow for fasting a.m. blood sugar tomorrow morning to further evaluate. ? ? ? ? ?Babs Bertin, DO ?Hospitalist ? ?

## 2021-07-25 NOTE — Progress Notes (Signed)
Pharmacy Antibiotic Note ? ?[name] is a 76 y.o. female admitted on 07/21/2021 with AFib RVR, with concerns for sepsis with increased RR and elevated temp vs. possible CAP/aspiration pneumonia Pharmacy has been consulted for Vancomycin and Cefepime dosing.  ? ?Plan: ?Cefepime 2 g IV q12h ?Vancomycin 1250 mg IV q24h (eAUC 548.5, Scr 0.85) ?Follow-up renal function, cultures, and LOT ?Obtain Vancomycin levels as needed ? ?Height: '5\' 7"'$  (170.2 cm) ?Weight: 63.5 kg (140 lb) ?IBW/kg (Calculated) : 61.6 ? ?Temp (24hrs), Avg:98.8 ?F (37.1 ?C), Min:97.6 ?F (36.4 ?C), Max:99.7 ?F (37.6 ?C) ? ?Recent Labs  ?Lab 07/21/21 ?1136 07/22/21 ?0601 07/22/21 ?1309 07/22/21 ?1526 07/22/21 ?2053 07/23/21 ?0232 07/23/21 ?1622 07/23/21 ?1813 07/24/21 ?0259 07/25/21 ?0226  ?WBC 10.5 10.2  --   --   --  9.4  --   --  11.0* 9.0  ?CREATININE 0.90 0.69  --   --   --  0.80  --   --  0.85  --   ?LATICACIDVEN  --   --    < > 2.0* 3.1*  --  4.1* 3.7* 3.2*  --   ? < > = values in this interval not displayed.  ?  ?Estimated Creatinine Clearance: 54.8 mL/min (by C-G formula based on SCr of 0.85 mg/dL).   ? ?Allergies  ?Allergen Reactions  ? Promethazine Other (See Comments)  ?  Not listed on MAR, unknown reaction  ? ? ?Antimicrobials this admission: ?Vancomycin 4/20 >>  ?Cefepime 4/20 >>  ? ?Microbiology results: ?4/20 BCx: ng x 3d ?4/21 UCx: pending  ?4/20 MRSA PCR: negative ? ? ?Thank you for allowing pharmacy to be a part of this patient?s care. ? ?Vance Peper, PharmD ?PGY1 Pharmacy Resident ?Phone [phone] ?07/25/2021 11:16 AM  ? ?Please check AMION for all [facility] phone numbers ?After 10:00 PM, call [place] [phone] ? ? ?

## 2021-07-26 DIAGNOSIS — I4891 Unspecified atrial fibrillation: Secondary | ICD-10-CM | POA: Diagnosis not present

## 2021-07-26 DIAGNOSIS — F01B11 Vascular dementia, moderate, with agitation: Secondary | ICD-10-CM | POA: Diagnosis not present

## 2021-07-26 LAB — TYPE AND SCREEN
ABO/RH(D): O NEG
Antibody Screen: NEGATIVE
Unit division: 0
Unit division: 0
Unit division: 0

## 2021-07-26 LAB — BPAM RBC
Blood Product Expiration Date: 202305012359
Blood Product Expiration Date: 202305172359
Blood Product Expiration Date: 202305252359
ISSUE DATE / TIME: 202304220737
ISSUE DATE / TIME: 202304221114
ISSUE DATE / TIME: 202304221234
Unit Type and Rh: 5100
Unit Type and Rh: 5100
Unit Type and Rh: 9500

## 2021-07-26 LAB — CBC
HCT: 29 % — ABNORMAL LOW (ref 36.0–46.0)
Hemoglobin: 8.9 g/dL — ABNORMAL LOW (ref 12.0–15.0)
MCH: 25.6 pg — ABNORMAL LOW (ref 26.0–34.0)
MCHC: 30.7 g/dL (ref 30.0–36.0)
MCV: 83.6 fL (ref 80.0–100.0)
Platelets: 290 10*3/uL (ref 150–400)
RBC: 3.47 MIL/uL — ABNORMAL LOW (ref 3.87–5.11)
RDW: 19.2 % — ABNORMAL HIGH (ref 11.5–15.5)
WBC: 8.4 10*3/uL (ref 4.0–10.5)
nRBC: 0 % (ref 0.0–0.2)

## 2021-07-26 LAB — GLUCOSE, CAPILLARY
Glucose-Capillary: 61 mg/dL — ABNORMAL LOW (ref 70–99)
Glucose-Capillary: 88 mg/dL (ref 70–99)
Glucose-Capillary: 130 mg/dL — ABNORMAL HIGH (ref 70–99)
Glucose-Capillary: 103 mg/dL — ABNORMAL HIGH (ref 70–99)
Glucose-Capillary: 143 mg/dL — ABNORMAL HIGH (ref 70–99)

## 2021-07-26 LAB — URINE CULTURE: Culture: >=100000 — AB

## 2021-07-26 MED ORDER — METOPROLOL TARTRATE 25 MG PO TABS
25.0000 mg | ORAL_TABLET | Freq: Two times a day (BID) | ORAL | Status: DC
  Administered 2021-07-26 – 2021-07-30 (×8): 25 mg via ORAL
  Filled 2021-07-26 (×8): qty 1

## 2021-07-26 MED ORDER — LINEZOLID 600 MG PO TABS
600.0000 mg | ORAL_TABLET | Freq: Two times a day (BID) | ORAL | Status: DC
  Administered 2021-07-26 – 2021-07-28 (×4): 600 mg via ORAL
  Filled 2021-07-26 (×4): qty 1

## 2021-07-26 MED ORDER — SODIUM CHLORIDE 0.9 % IV SOLN
2.0000 g | INTRAVENOUS | Status: DC
  Administered 2021-07-26 – 2021-07-27 (×2): 2 g via INTRAVENOUS
  Filled 2021-07-26 (×2): qty 20

## 2021-07-26 MED ORDER — DILTIAZEM HCL ER COATED BEADS 180 MG PO CP24
360.0000 mg | ORAL_CAPSULE | Freq: Every day | ORAL | Status: DC
  Administered 2021-07-27 – 2021-07-30 (×4): 360 mg via ORAL
  Filled 2021-07-26 (×4): qty 2

## 2021-07-26 MED ORDER — LINEZOLID 100 MG/5ML PO SUSR: 600.0000 mg | Freq: Two times a day (BID) | ORAL | Status: DC

## 2021-07-26 NOTE — Progress Notes (Signed)
Unable to complete admit questions, pt w/dementia and no family present at bedside ?

## 2021-07-26 NOTE — Progress Notes (Addendum)
Inpatient Diabetes Program Recommendations ? ?AACE/ADA: New Consensus Statement on Inpatient Glycemic Control (2015) ? ?Target Ranges:  Prepandial:   less than 140 mg/dL ?     Peak postprandial:   less than 180 mg/dL (1-2 hours) ?     Critically ill patients:  140 - 180 mg/dL  ? ?Lab Results  ?Component Value Date  ? GLUCAP 88 07/26/2021  ? HGBA1C 5.7 (H) 05/21/2021  ? ? ?Review of Glycemic Control ? Latest Reference Range & Units 07/25/21 08:15 07/25/21 09:15 07/25/21 11:36 07/25/21 17:07 07/25/21 22:08 07/26/21 07:53 07/26/21 09:49  ?Glucose-Capillary 70 - 99 mg/dL 56 (L) 158 (H) 127 (H) 94 131 (H) 61 (L) 88  ? ?Diabetes history: DM 2 ?Outpatient Diabetes medications: Lantus 30 units Daily, Humalog 6 units qam and hs, Metformin 1000 mg bid ?Current orders for Inpatient glycemic control:  ?Semglee 30 units qhs ?Metformin 1000 mg bid ?Novolog 0-15 units tid ? ?PO Prednisone 5 mg Daily ? ?Inpatient Diabetes Program Recommendations:   ? ?Hypoglycemia the past 2 mornings ?-  Pt may benefit from reducing Semglee to 24 units ? ?Thanks, ?Tama Headings RN, MSN, BC-ADM ?Inpatient Diabetes Coordinator ?Team Pager [phone] (8a-5p) ? ? ? ?

## 2021-07-26 NOTE — Consult Note (Addendum)
?Cardiology Consultation:  ? ?Patient ID: [name] ?MRN: [id number]; DOB: 02-25-1946 ? ?Admit date: 07/21/2021 ?Date of Consult: 07/26/2021 ? ?PCP:  Aletha Halim., PA-C ?  ?[facility] Providers ?Cardiologist:  Kirk Ruths, MD      ? ? ?Patient Profile:  ? ?[name] is a 76 y.o. female with a hx of permanent atrial fibrillation, history of GIB secondary to AVM's, HFpEF, HTN, HLD, Stage 3 CKD, RA on steroids, and prior CVA, who is being seen 07/26/2021 for the evaluation of atrial fib, RVR, at the request of Dr Marthenia Rolling. ? ?History of Present Illness:  ? ?[name] was admitted 03/2020 for sepsis, AF RVR, d/c on Cardizem CD 240 mg qd and Lopressor 100 mg tid ?Admitted 05/2021 for sepsis, d/c on Cardizem, Lopressor changed to 100 mg bid. ?Admitted 06/10/2021 after a fall, UTI, in afib RVR. D/c on Cadrizem CD 360 mg qd and Lopressor 100 mg bid ?Admitted 06/28/2021 with lethargy and fever, sepsis ruled out,  in afib, RVR. D/c on Cardizem CD 240 mg bid and lopressor 100 mg bid ? ?Admitted 04/19 with Afib, RVR. Per reports from the SNF, pt spitting out her meds pta. HR initially 160s-170s. HR improved w/ IV Cardizem at 15 mg/hr, generally in the low 100s. Cards asked to see.  ? ?[name] is not able to give much information. She initially resists exam, but does cooperate some. Says she is getting better, no specific complaints.  ? ?Says she is never aware of the atrial fib, has not been light-headed or dizzy.  ? ? ?Past Medical History:  ?Diagnosis Date  ? Abnormal LFTs 07/24/2017  ? Actinic keratosis   ? Acute CVA (cerebrovascular accident) ([place])   ? Acute deep vein thrombosis (DVT) of left lower extremity ([place]) 05/06/2014  ? Anemia   ? hx of  ? Anemia of chronic renal failure, stage 3 (moderate) ([place]) 07/17/2015  ? Anticoagulant causing adverse effect in therapeutic use 07/17/2015  ? Anxiety   ? Arthritis   ? rheumatoid  ? Atrial fibrillation ([place])   ? a. persistent, she remains on Xarelto  ? Carpal tunnel  syndrome   ? Chronic diastolic (congestive) heart failure (HCC)   ? a. 04/2016: echo showing a preserved EF of 55-60% with mild MR. LA and RA midly dilated.   ? Edema   ? left leg at ankle resolved now  ? Gastroesophageal reflux disease   ? Hepatitis 1970  ? not sure what kind  ? Hiatal hernia   ? Hyperlipidemia   ? Hypertension   ? Iron deficiency anemia due to chronic blood loss 07/17/2015  ? Jaundice   ? age 46  ? Peripheral vascular disease ([place]) yrs ago  ? DVT left lower leg questionale told by 2 drs i had no clot, 1 md said i did  ? Rheumatoid arthritis(714.0)   ? Right knee pain   ? Stroke [facility])   ? Type II diabetes mellitus ([place])   ? type2  ? ? ?Past Surgical History:  ?Procedure Laterality Date  ? CATARACT EXTRACTION Bilateral   ? COLONOSCOPY WITH PROPOFOL N/A 02/20/2015  ? Procedure: COLONOSCOPY WITH PROPOFOL;  Surgeon: Carol Ada, MD;  Location: [facility];  Service: Endoscopy;  Laterality: N/A;  ? ENTEROSCOPY N/A 03/23/2016  ? Procedure: ENTEROSCOPY;  Surgeon: Carol Ada, MD;  Location: [facility];  Service: Endoscopy;  Laterality: N/A;  ? ENTEROSCOPY N/A 06/17/2016  ? Procedure: ENTEROSCOPY;  Surgeon: Carol Ada, MD;  Location: [facility];  Service: Endoscopy;  Laterality: N/A;  ? ESOPHAGOGASTRODUODENOSCOPY (EGD) WITH PROPOFOL N/A 02/20/2015  ? Procedure: ESOPHAGOGASTRODUODENOSCOPY (EGD) WITH PROPOFOL;  Surgeon: Carol Ada, MD;  Location: [facility];  Service: Endoscopy;  Laterality: N/A;  ? GIVENS CAPSULE STUDY N/A 03/21/2016  ? Procedure: GIVENS CAPSULE STUDY;  Surgeon: Carol Ada, MD;  Location: [facility];  Service: Endoscopy;  Laterality: N/A;  ? HAND SURGERY  1995 and 1996  ? artificial joints both hands  ? JOINT REPLACEMENT    ? LUMBAR LAMINECTOMY/DECOMPRESSION MICRODISCECTOMY N/A 02/10/2016  ? Procedure: MICROLUMBAR DECOMPRESSION L4-L5 AND L3- L4, AND EXCISION OF SYNOVIAL CYST L4-L5;  Surgeon: Susa Day, MD;  Location: [facility];  Service: Orthopedics;  Laterality: N/A;  ? TOTAL  HIP ARTHROPLASTY Left 2009  ? TOTAL HIP ARTHROPLASTY Right 04/03/2013  ? Procedure: RIGHT TOTAL HIP ARTHROPLASTY ANTERIOR APPROACH;  Surgeon: Gearlean Alf, MD;  Location: [facility];  Service: Orthopedics;  Laterality: Right;  ?  ? ?Home Medications:  ?Prior to Admission medications   ?Medication Sig Start Date End Date Taking? Authorizing Provider  ?acetaminophen (TYLENOL) 650 MG CR tablet Take 1,300 mg by mouth in the morning and at bedtime.   Yes [provider]  ?albuterol (VENTOLIN HFA) 108 (90 Base) MCG/ACT inhaler Inhale 2 puffs into the lungs every 4 (four) hours as needed for wheezing or shortness of breath.   Yes [provider]  ?Amino Acids-Protein Hydrolys (FEEDING SUPPLEMENT, PRO-STAT 64,) LIQD Take 30 mLs by mouth in the morning.   Yes [provider]  ?atorvastatin (LIPITOR) 20 MG tablet Take 20 mg by mouth daily. 01/27/20  Yes [provider]  ?azelastine (OPTIVAR) 0.05 % ophthalmic solution Place 1 drop into both eyes 2 (two) times daily.   Yes [provider]  ?buPROPion ER (WELLBUTRIN SR) 100 MG 12 hr tablet Take 1 tablet (100 mg total) by mouth 2 (two) times daily. 05/25/21  Yes Barton Dubois, MD  ?Calcium Carbonate-Vit D-Min (CALCIUM 1200) 1200-1000 MG-UNIT CHEW Chew 1 tablet by mouth daily.   Yes [provider]  ?carboxymethylcellulose (ARTIFICIAL TEARS) 1 % ophthalmic solution Place 2 drops into both eyes in the morning.   Yes [provider]  ?cefTRIAXone (ROCEPHIN) 1 g injection Inject 1 g into the muscle See admin instructions. 1g intramuscularly once daily for 3 days   Yes [provider]  ?cetirizine (ZYRTEC) 10 MG tablet Take 10 mg by mouth daily.   Yes [provider]  ?cycloSPORINE (RESTASIS) 0.05 % ophthalmic emulsion Place 1 drop into both eyes 2 (two) times daily.   Yes [provider]  ?dextrose 5 % solution Inject 50 mLs into the vein continuous. 50cc/hr   Yes [provider]   ?diclofenac Sodium (VOLTAREN) 1 % GEL Apply 2 g topically daily as needed for pain. 03/15/20  Yes [provider]  ?diltiazem (CARDIZEM LA) 240 MG 24 hr tablet Take 1 tablet (240 mg total) by mouth 2 (two) times daily. 06/30/21 07/30/21 Yes Manuella Ghazi, Pratik D, DO  ?Emollient (AQUAPHOR ADV THERAPY HEALING) OINT Apply 1 application. topically See admin instructions. To rash on face in the evening every Monday, Thursday   Yes [provider]  ?Eyelid Cleansers (OCUSOFT LID SCRUB ORIGINAL) PADS Apply 1 application. topically in the morning and at bedtime. Both eyelids   Yes [provider]  ?ferrous sulfate 325 (65 FE) MG EC tablet Take 325 mg by mouth 3 (three) times daily with meals.   Yes [provider]  ?fluticasone-salmeterol (ADVAIR HFA)  115-21 MCG/ACT inhaler Inhale 2 puffs into the lungs 2 times daily at 12 noon and 4 pm.   Yes [provider]  ?furosemide (LASIX) 20 MG tablet Take 20 mg by mouth daily as needed for edema.   Yes [provider]  ?gabapentin (NEURONTIN) 300 MG capsule Take 300 mg by mouth 3 (three) times daily.   Yes [provider]  ?insulin glargine (LANTUS) 100 UNIT/ML injection Inject 30 Units into the skin daily. Hold if BS is less than 150   Yes [provider]  ?insulin lispro (HUMALOG) 100 UNIT/ML injection Inject 6 Units into the skin in the morning and at bedtime. Hold if BS <100   Yes [provider]  ?magnesium oxide (MAG-OX) 400 MG tablet Take 400 mg by mouth 2 (two) times daily.   Yes [provider]  ?Menthol-Zinc Oxide (CALMOSEPTINE) 0.44-20.6 % OINT Apply 1 application. topically every 8 (eight) hours as needed (protection/prevention).   Yes [provider]  ?metFORMIN (GLUCOPHAGE) 500 MG tablet Take 1,000 mg by mouth 2 (two) times daily.   Yes [provider]  ?metoprolol tartrate (LOPRESSOR) 100 MG tablet Take 1 tablet (100 mg total) by mouth 2 (two) times daily. 05/25/21  Yes  Barton Dubois, MD  ?Multiple Vitamin (MULTIVITAMIN) tablet Take 1 tablet by mouth daily.   Yes [provider]  ?Multiple Vitamins-Minerals (PRESERVISION AREDS PO) Take 1 capsule by mouth daily

## 2021-07-26 NOTE — Progress Notes (Signed)
?PROGRESS NOTE ? ? ? [name]  DXI:[id number] DOB: 29-Jul-1945 DOA: 07/21/2021 ?PCP: Aletha Halim., PA-C  ?Outpatient Specialists:  ? ? ?Brief Narrative:  ?Patient is a 76 year old female with advanced dementia, prior history significant for atrial fibrillation on Xarelto, rheumatoid arthritis on chronic steroids, insulin-dependent diabetes mellitus, hypertension, chronic diastolic CHF, anemia secondary to CKD, chronic kidney disease stage IIIa, and chronic pressure injury of skin of right foot.  Patient was admitted with atrial fibrillation with rapid ventricular response.  Patient has been on IV Cardizem with heart rate in the low 100s.  Cardiology team has been consulted today.  Cardiology input is appreciated.  During the hospital stay, patient developed cellulitis/sepsis syndrome.  Work-up revealed left foot cellulitis, pneumonia and UTI secondary to Enterococcus (VRE).  Sepsis physiology has resolved currently.  Patient has continued to improve.  Patient was on IV vancomycin and cefepime, but this will be changed to IV Rocephin and oral Zyvox today.  Patient has continued to improve.   ? ? ?Assessment & Plan: ?  ?Principal Problem: ?  A-fib ([place]) ?Active Problems: ?  Anemia of chronic renal failure, stage 3 (moderate) (HCC) ?  Atrial fibrillation with RVR ([place]) ?  Hypomagnesemia ?  Hypokalemia ?  Sepsis ([place]) ?  Dementia with agitation ([place]) ? ?SIRS/possible early sepsis: ?-Admitted with A-fib/RVR. ?-Ventricular rate is improving. ?-Patient has been on IV Cardizem. ?-Temp of 100.5 ?F noted. ?-No leukocytosis. ?-Tachypnea reported. ?-Blood pressure stable. ?-Panculture patient. ?-Left foot (dorsum of left foot) mild cellulitis noted. ?-IV Vanco and cefepime ordered and discontinued. ?-Check lactic acid and procalcitonin. ?-Also check D-dimer. ?07/26/2021: Patient continues to improve.  Antibiotics changed to IV Rocephin and oral Zyvox today. ? ?Community-acquired pneumonia/aspiration pneumonia: ?-See  chest x-ray findings. ?-Continue IV antibiotics. ? ?A-fib with RVR: ?-Improving. ?-Patient was on heparin.  Patient is currently on Xarelto. ?-Check D-dimer, cardiac BNP, chest x-ray. ?-Heart rate is improving.  Cardiology team consulted ?07/26/2021: RVR persists.  Beta-blocker added.  Cardiology team plans to convert Cardizem drip to oral.   ? ?Hypomagnesemia: ?-Continue to monitor and replete. ?-Hypokalemia: ?-Continue to monitor and replete. ?07/26/2021: Continue to monitor and replete. ? ?Advanced dementia: ?-Supportive care. ? ?Rheumatoid arthritis: ?-Long-term prednisone. ? ?Insulin-dependent diabetes mellitus: ?-On subcutaneous Lantus 30 units at bedtime and sliding scale insulin coverage. ?-Continue to monitor. ? ?History of CKD 3A/anemia versus AKI: ?-Continue to monitor closely. ?07/24/2021: Renal function is back to normal. ? ? ?DVT prophylaxis: Heparin drip. ?Code Status: DO NOT RESUSCITATE ?Family Communication:  ?Disposition Plan:  ? ? ?Consultants:  ?Wound care ? ?Procedures:  ?None. ? ?Antimicrobials:  ?IV vancomycin ?IV cefepime ? ? ?Subjective: ?No new complaints. ?No fever or chills. ?No shortness of breath. ?No chest pain. ?No palpitations. ? ?Objective: ?Vitals:  ? 07/26/21 0056 07/26/21 0456 07/26/21 0756 07/26/21 1149  ?BP: 103/63 117/61 110/67 139/69  ?Pulse: 96  98 (!) 115  ?Resp: '18 18 18 18  '$ ?Temp: 97.9 ?F (36.6 ?C) 98.9 ?F (37.2 ?C) 97.9 ?F (36.6 ?C) 98.2 ?F (36.8 ?C)  ?TempSrc: Axillary Oral Oral Oral  ?SpO2: 95% 96% 100% 100%  ?Weight:      ?Height:      ? ? ?Intake/Output Summary (Last 24 hours) at 07/26/2021 1520 ?Last data filed at 07/26/2021 1036 ?Gross per 24 hour  ?Intake 4376.39 ml  ?Output 3350 ml  ?Net 1026.39 ml  ? ? ?Filed Weights  ? 07/21/21 1100  ?Weight: 63.5 kg  ? ? ?Examination: ? ?General exam: Appears calm and  comfortable.  Patient is not in any distress.  I ?Respiratory system: Clear to auscultation.  ?Cardiovascular system: S1 & S2, irregularly irregular.    ?Gastrointestinal system: Abdomen is nondistended, soft and nontender. No organomegaly or masses felt. Normal bowel sounds heard. ?Central nervous system: Sleeping quietly. ?Extremities: Redness of the dorsum of left foot has improved significantly.   ? ? ?Data Reviewed: I have personally reviewed following labs and imaging studies ? ?CBC: ?Recent Labs  ?Lab 07/21/21 ?1136 07/22/21 ?0601 07/23/21 ?0232 07/24/21 ?0259 07/24/21 ?0830 07/25/21 ?0226 07/26/21 ?0436  ?WBC 10.5 10.2 9.4 11.0*  --  9.0 8.4  ?NEUTROABS 7.7  --  7.3  --   --   --   --   ?HGB 10.1* 8.2* 7.3* 6.8* 7.8* 8.0* 8.9*  ?HCT 34.8* 27.4* 25.2* 22.7* 26.1* 26.8* 29.0*  ?MCV 84.1 82.5 84.3 83.5  --  85.6 83.6  ?PLT 446* 377 325 358  --  341 290  ? ? ?Basic Metabolic Panel: ?Recent Labs  ?Lab 07/21/21 ?1136 07/22/21 ?0601 07/23/21 ?0232 07/24/21 ?3300  ?NA 141 139 137 137  ?K 3.6 3.3* 4.0 3.9  ?CL 104 108 109 108  ?CO2 23 22 19* 18*  ?GLUCOSE 146* 107* 119* 70  ?BUN '18 13 14 16  '$ ?CREATININE 0.90 0.69 0.80 0.85  ?CALCIUM 8.8* 7.8* 7.9* 8.3*  ?MG 1.4* 1.8 1.6*  --   ?PHOS  --   --  3.0  --   ? ? ?GFR: ?Estimated Creatinine Clearance: 54.8 mL/min (by C-G formula based on SCr of 0.85 mg/dL). ?Liver Function Tests: ?Recent Labs  ?Lab 07/21/21 ?1136 07/23/21 ?0232 07/24/21 ?7622  ?AST 29  --  20  ?ALT 21  --  16  ?ALKPHOS 277*  --  179*  ?BILITOT 1.1  --  0.8  ?PROT 6.6  --  4.9*  ?ALBUMIN 2.2* 1.7* 1.7*  ? ? ?Recent Labs  ?Lab 07/21/21 ?1136  ?LIPASE 43  ? ? ?No results for input(s): AMMONIA in the last 168 hours. ?Coagulation Profile: ?Recent Labs  ?Lab 07/21/21 ?1136  ?INR 1.4*  ? ? ?Cardiac Enzymes: ?No results for input(s): CKTOTAL, CKMB, CKMBINDEX, TROPONINI in the last 168 hours. ?BNP (last 3 results) ?No results for input(s): PROBNP in the last 8760 hours. ?HbA1C: ?No results for input(s): HGBA1C in the last 72 hours. ?CBG: ?Recent Labs  ?Lab 07/25/21 ?1707 07/25/21 ?2208 07/26/21 ?0753 07/26/21 ?6333 07/26/21 ?1151  ?GLUCAP 94 131* 61* 88 130*   ? ? ?Lipid Profile: ?No results for input(s): CHOL, HDL, LDLCALC, TRIG, CHOLHDL, LDLDIRECT in the last 72 hours. ?Thyroid Function Tests: ?No results for input(s): TSH, T4TOTAL, FREET4, T3FREE, THYROIDAB in the last 72 hours. ? ?Anemia Panel: ?No results for input(s): VITAMINB12, FOLATE, FERRITIN, TIBC, IRON, RETICCTPCT in the last 72 hours. ?Urine analysis: ?   ?Component Value Date/Time  ? COLORURINE YELLOW 06/28/2021 1550  ? APPEARANCEUR CLEAR 06/28/2021 1550  ? LABSPEC 1.020 06/28/2021 1550  ? PHURINE 5.0 06/28/2021 1550  ? GLUCOSEU NEGATIVE 06/28/2021 1550  ? HGBUR NEGATIVE 06/28/2021 1550  ? [place] NEGATIVE 06/28/2021 1550  ? [place] NEGATIVE 06/28/2021 1550  ? PROTEINUR >=300 (A) 06/28/2021 1550  ? UROBILINOGEN 1.0 10/09/2014 0040  ? NITRITE NEGATIVE 06/28/2021 1550  ? LEUKOCYTESUR NEGATIVE 06/28/2021 1550  ? ?Sepsis Labs: ?'@LABRCNTIP'$ (procalcitonin:4,lacticidven:4) ? ?) ?Recent Results (from the past 240 hour(s))  ?Resp Panel by RT-PCR (Flu A&B, Covid) Nasopharyngeal Swab     Status: None  ? Collection Time: 07/21/21  2:09 PM  ? Specimen:  Nasopharyngeal Swab; Nasopharyngeal(NP) swabs in vial transport medium  ?Result Value Ref Range Status  ? SARS Coronavirus 2 by RT PCR NEGATIVE NEGATIVE Final  ?  Comment: (NOTE) ?SARS-CoV-2 target nucleic acids are NOT DETECTED. ? ?The SARS-CoV-2 RNA is generally detectable in upper respiratory ?specimens during the acute phase of infection. The lowest ?concentration of SARS-CoV-2 viral copies this assay can detect is ?138 copies/mL. A negative result does not preclude SARS-Cov-2 ?infection and should not be used as the sole basis for treatment or ?other patient management decisions. A negative result may occur with  ?improper specimen collection/handling, submission of specimen other ?than nasopharyngeal swab, presence of viral mutation(s) within the ?areas targeted by this assay, and inadequate number of viral ?copies(<138 copies/mL). A negative result must be  combined with ?clinical observations, patient history, and epidemiological ?information. The expected result is Negative. ? ?Fact Sheet for Patients:  ?EntrepreneurPulse.com.au ? ?Fact Sheet for Healthcare Pro

## 2021-07-27 DIAGNOSIS — F01B11 Vascular dementia, moderate, with agitation: Secondary | ICD-10-CM

## 2021-07-27 DIAGNOSIS — I4891 Unspecified atrial fibrillation: Secondary | ICD-10-CM | POA: Diagnosis not present

## 2021-07-27 DIAGNOSIS — I480 Paroxysmal atrial fibrillation: Secondary | ICD-10-CM | POA: Diagnosis not present

## 2021-07-27 LAB — CBC
HCT: 29.6 % — ABNORMAL LOW (ref 36.0–46.0)
Hemoglobin: 9.1 g/dL — ABNORMAL LOW (ref 12.0–15.0)
MCH: 25.9 pg — ABNORMAL LOW (ref 26.0–34.0)
MCHC: 30.7 g/dL (ref 30.0–36.0)
MCV: 84.3 fL (ref 80.0–100.0)
Platelets: 356 10*3/uL (ref 150–400)
RBC: 3.51 MIL/uL — ABNORMAL LOW (ref 3.87–5.11)
RDW: 19.7 % — ABNORMAL HIGH (ref 11.5–15.5)
WBC: 8.9 10*3/uL (ref 4.0–10.5)
nRBC: 0 % (ref 0.0–0.2)

## 2021-07-27 LAB — GLUCOSE, CAPILLARY
Glucose-Capillary: 52 mg/dL — ABNORMAL LOW (ref 70–99)
Glucose-Capillary: 96 mg/dL (ref 70–99)
Glucose-Capillary: 80 mg/dL (ref 70–99)
Glucose-Capillary: 65 mg/dL — ABNORMAL LOW (ref 70–99)
Glucose-Capillary: 84 mg/dL (ref 70–99)

## 2021-07-27 LAB — CULTURE, BLOOD (ROUTINE X 2)
Culture: NO GROWTH
Culture: NO GROWTH
Special Requests: ADEQUATE
Special Requests: ADEQUATE

## 2021-07-27 NOTE — Progress Notes (Signed)
?Progress Note ? ? ? ?[name]   ?KZS:[id number]  ?DOB: 1945-04-30  ?DOA: 07/21/2021     6 ?PCP: Aletha Halim., PA-C ? ?Initial CC: afib with RVR ? ?Hospital Course: ?Patient is a 76 year old female with advanced dementia, prior history significant for atrial fibrillation on Xarelto, rheumatoid arthritis on chronic steroids, insulin-dependent diabetes mellitus, hypertension, chronic diastolic CHF, anemia secondary to CKD, chronic kidney disease stage IIIa, and chronic pressure injury of skin of right foot.  Patient was admitted with atrial fibrillation with rapid ventricular response.   ?She was started on a cardizem drip initially which was transitioned to oral cardizem.  She was also treated for left foot cellulitis, pneumonia, and UTI due to VRE.  ? ?Interval History:  ?No events overnight.  Resting in bed comfortable appearing with underlying dementia appreciated. ? ?Assessment and Plan: ? ?SIRS/possible early sepsis: ?-Admitted with A-fib/RVR. ?-Urine culture noted with VRE ?- Antibiotics have been de-escalated since admission and currently on Rocephin for pneumonia coverage and oral Zyvox for VRE in urine culture ?  ?Community-acquired pneumonia/aspiration pneumonia: ?- concern was for right basilar opacities on CXR  ?- continue course of abx ? ?A-fib with RVR: ?- s/p cardizem drip, now on oral cardizem and Lopressor ?-Also treated with heparin drip initially and now transitioned to [place] ?- Cardiology followed as well, appreciate assistance ? ?Hypomagnesemia: ?-Continue to monitor and replete. ? ?-Hypokalemia: ?-Continue to monitor and replete. ?  ?Advanced dementia: ?-Supportive care. ?  ?Rheumatoid arthritis: ?-Long-term prednisone. ?  ?Insulin-dependent diabetes mellitus: ?-On subcutaneous Lantus 30 units at bedtime and sliding scale insulin coverage. ?-Continue to monitor. ? ?History of CKD 3A/anemia versus AKI: ?-Continue to monitor closely. ?07/24/2021: Renal function is back to  normal. ? ? ? ?Old records reviewed in assessment of this patient ? ?Antimicrobials: ?Vanc 4/20 >> 4/24 ?Cefepime 4/20 >> 4/24 ?Rocephin 4/24 >> current ?Zyvox 4/24 >> current ? ?DVT prophylaxis:  ? ?rivaroxaban (XARELTO) tablet 20 mg  ? ?Code Status:   Code Status: DNR ? ?Disposition Plan:  SNF ?Status is: Inpt ? ?Objective: ?Blood pressure 125/73, pulse (!) 102, temperature 97.8 ?F (36.6 ?C), temperature source Oral, resp. rate 18, height '5\' 7"'$  (1.702 m), weight 63.5 kg, SpO2 98 %.  ?Examination:  ?Physical Exam ?Constitutional:   ?   Comments: Chronically ill-appearing elderly woman laying in bed in no distress with underlying dementia appreciated  ?HENT:  ?   Head: Normocephalic and atraumatic.  ?   Mouth/Throat:  ?   Mouth: Mucous membranes are moist.  ?Eyes:  ?   Extraocular Movements: Extraocular movements intact.  ?Cardiovascular:  ?   Rate and Rhythm: Tachycardia present. Rhythm irregular.  ?Pulmonary:  ?   Effort: Pulmonary effort is normal. No respiratory distress.  ?   Breath sounds: Normal breath sounds.  ?Abdominal:  ?   General: Bowel sounds are normal. There is no distension.  ?   Palpations: Abdomen is soft.  ?   Tenderness: There is no abdominal tenderness.  ?Musculoskeletal:     ?   General: Swelling present. Normal range of motion.  ?   Cervical back: Normal range of motion.  ?Skin: ?   General: Skin is warm.  ?Neurological:  ?   Mental Status: Mental status is at baseline.  ?   Comments: Follows some commands.  Moves all 4 extremities  ?  ? ?Consultants:  ?Cardiology ? ?Procedures:  ? ? ?Data Reviewed: ?Results for orders placed or performed during the hospital encounter of 07/21/21 (  from the past 24 hour(s))  ?Glucose, capillary     Status: Abnormal  ? Collection Time: 07/26/21  4:23 PM  ?Result Value Ref Range  ? Glucose-Capillary 103 (H) 70 - 99 mg/dL  ?Glucose, capillary     Status: Abnormal  ? Collection Time: 07/26/21  9:13 PM  ?Result Value Ref Range  ? Glucose-Capillary 143 (H) 70 - 99  mg/dL  ?CBC     Status: Abnormal  ? Collection Time: 07/27/21  3:39 AM  ?Result Value Ref Range  ? WBC 8.9 4.0 - 10.5 K/uL  ? RBC 3.51 (L) 3.87 - 5.11 MIL/uL  ? Hemoglobin 9.1 (L) 12.0 - 15.0 g/dL  ? HCT 29.6 (L) 36.0 - 46.0 %  ? MCV 84.3 80.0 - 100.0 fL  ? MCH 25.9 (L) 26.0 - 34.0 pg  ? MCHC 30.7 30.0 - 36.0 g/dL  ? RDW 19.7 (H) 11.5 - 15.5 %  ? Platelets 356 150 - 400 K/uL  ? nRBC 0.0 0.0 - 0.2 %  ?Glucose, capillary     Status: Abnormal  ? Collection Time: 07/27/21  7:31 AM  ?Result Value Ref Range  ? Glucose-Capillary 52 (L) 70 - 99 mg/dL  ?Glucose, capillary     Status: None  ? Collection Time: 07/27/21  9:07 AM  ?Result Value Ref Range  ? Glucose-Capillary 96 70 - 99 mg/dL  ?Glucose, capillary     Status: None  ? Collection Time: 07/27/21 12:25 PM  ?Result Value Ref Range  ? Glucose-Capillary 80 70 - 99 mg/dL  ?  ?I have Reviewed nursing notes, Vitals, and Lab results since pt's last encounter. Pertinent lab results : see above ?I have ordered test including BMP, CBC, Mg ?I have reviewed the last note from staff over past 24 hours ?I have discussed pt's care plan and test results with nursing staff, case manager ? ? LOS: 6 days  ? ?Dwyane Dee, MD ?[facility] ?07/27/2021, 1:20 PM ? ?

## 2021-07-27 NOTE — Care Management Important Message (Signed)
Important Message ? ?Patient Details  ?Name: [name] ?MRN: [id number] ?Date of Birth: 26-Nov-1945 ? ? ?Medicare Important Message Given:  Yes ? ? ? ? ?Shelda Altes ?07/27/2021, 8:26 AM ?

## 2021-07-27 NOTE — Progress Notes (Signed)
? ? ?Referring-Kristen [name], PA-C ?Reason for referral-atrial fibrillation ? ?HPI: 76 year old female for evaluation of atrial fibrillation at request of Bing Matter, PA-C.  Patient seen previously but not since February 2020.  Patient has a history of permanent atrial fibrillation.  Nuclear study December 2016 showed normal perfusion. Not gated because of atrial fibrillation. Echocardiogram February 2023 showed normal LV function, mild left ventricular hypertrophy, moderate pulmonary hypertension.  Abdominal CT March 2023 showed exophytic lesion in the right kidney but follow-up ultrasound suggested cyst.  CT also showed increased attenuation along the anterior aspect of the left adnexa and follow-up pelvic ultrasound recommended.  ABIs April 2023 noncompressible bilaterally.  Patient has been admitted recently with sepsis, UTIs and atrial fibrillation with rapid ventricular response.  She apparently is not anticoagulated secondary to history of GI bleeding in the setting of AVMs.  Patient is in rehabilitation.  She has not been able to ambulate as of yet.  She denies dyspnea, chest pain, palpitations or syncope.  No pedal edema. ? ?Current Outpatient Medications  ?Medication Sig Dispense Refill  ? acetaminophen (TYLENOL) 650 MG CR tablet Take 1,300 mg by mouth in the morning and at bedtime.    ? albuterol (VENTOLIN HFA) 108 (90 Base) MCG/ACT inhaler Inhale 2 puffs into the lungs every 4 (four) hours as needed for wheezing or shortness of breath.    ? azelastine (OPTIVAR) 0.05 % ophthalmic solution Place 1 drop into both eyes 2 (two) times daily.    ? buPROPion ER (WELLBUTRIN SR) 100 MG 12 hr tablet Take 1 tablet (100 mg total) by mouth 2 (two) times daily. 10 tablet 0  ? carboxymethylcellulose (ARTIFICIAL TEARS) 1 % ophthalmic solution Place 2 drops into both eyes in the morning.    ? cycloSPORINE (RESTASIS) 0.05 % ophthalmic emulsion Place 1 drop into both eyes 2 (two) times daily.    ? diclofenac Sodium  (VOLTAREN) 1 % GEL Apply 2 g topically daily as needed for pain.    ? diltiazem (CARDIZEM CD) 360 MG 24 hr capsule Take 1 capsule (360 mg total) by mouth daily.    ? ferrous sulfate 325 (65 FE) MG tablet Take 1 tablet (325 mg total) by mouth 2 (two) times daily with a meal.  3  ? fluticasone-salmeterol (ADVAIR HFA) 115-21 MCG/ACT inhaler Inhale 2 puffs into the lungs 2 times daily at 12 noon and 4 pm.    ? gabapentin (NEURONTIN) 100 MG capsule Take 1 capsule (100 mg total) by mouth 2 (two) times daily. 30 capsule 0  ? insulin lispro (HUMALOG) 100 UNIT/ML injection Inject 6 Units into the skin in the morning and at bedtime. Hold if BS <100    ? magnesium oxide (MAG-OX) 400 MG tablet Take 400 mg by mouth 2 (two) times daily.    ? metoprolol tartrate (LOPRESSOR) 25 MG tablet Take 1 tablet (25 mg total) by mouth 2 (two) times daily.    ? nitroGLYCERIN (NITROSTAT) 0.4 MG SL tablet Place 1 tablet (0.4 mg total) under the tongue every 5 (five) minutes as needed for chest pain (x 3 doses). 25 tablet 2  ? omeprazole (PRILOSEC OTC) 20 MG tablet Take 20 mg by mouth daily.    ? pantoprazole (PROTONIX) 20 MG tablet Take 1 tablet (20 mg total) by mouth daily. 30 tablet 1  ? Pollen Extracts (PROSTAT PO) Take 30 mLs by mouth daily.    ? predniSONE (DELTASONE) 5 MG tablet Take 5 mg by mouth daily.    ? saccharomyces  boulardii (FLORASTOR) 250 MG capsule Take 250 mg by mouth daily.    ? vitamin C (ASCORBIC ACID) 500 MG tablet Take 500 mg by mouth 2 (two) times daily.    ? zinc gluconate 50 MG tablet Take 50 mg by mouth daily.    ? ?No current facility-administered medications for this visit.  ? ? ?Allergies  ?Allergen Reactions  ? Promethazine Other (See Comments)  ?  Not listed on MAR, unknown reaction  ? ? ? ?Past Medical History:  ?Diagnosis Date  ? Abnormal LFTs 07/24/2017  ? Actinic keratosis   ? Acute CVA (cerebrovascular accident) ([place])   ? Acute deep vein thrombosis (DVT) of left lower extremity ([place]) 05/06/2014  ? Anemia   ? hx  of  ? Anemia of chronic renal failure, stage 3 (moderate) ([place]) 07/17/2015  ? Anticoagulant causing adverse effect in therapeutic use 07/17/2015  ? Anxiety   ? Arthritis   ? rheumatoid  ? Atrial fibrillation ([place])   ? a. persistent, she remains on Xarelto  ? Carpal tunnel syndrome   ? Chronic diastolic (congestive) heart failure (HCC)   ? a. 04/2016: echo showing a preserved EF of 55-60% with mild MR. LA and RA midly dilated.   ? Edema   ? left leg at ankle resolved now  ? Gastroesophageal reflux disease   ? Hepatitis 1970  ? not sure what kind  ? Hiatal hernia   ? Hyperlipidemia   ? Hypertension   ? Iron deficiency anemia due to chronic blood loss 07/17/2015  ? Jaundice   ? age 34  ? Peripheral vascular disease ([place]) yrs ago  ? DVT left lower leg questionale told by 2 drs i had no clot, 1 md said i did  ? Rheumatoid arthritis(714.0)   ? Right knee pain   ? Stroke [facility])   ? Type II diabetes mellitus ([place])   ? type2  ? ? ?Past Surgical History:  ?Procedure Laterality Date  ? CATARACT EXTRACTION Bilateral   ? COLONOSCOPY WITH PROPOFOL N/A 02/20/2015  ? Procedure: COLONOSCOPY WITH PROPOFOL;  Surgeon: Carol Ada, MD;  Location: [facility];  Service: Endoscopy;  Laterality: N/A;  ? ENTEROSCOPY N/A 03/23/2016  ? Procedure: ENTEROSCOPY;  Surgeon: Carol Ada, MD;  Location: [facility];  Service: Endoscopy;  Laterality: N/A;  ? ENTEROSCOPY N/A 06/17/2016  ? Procedure: ENTEROSCOPY;  Surgeon: Carol Ada, MD;  Location: [facility];  Service: Endoscopy;  Laterality: N/A;  ? ESOPHAGOGASTRODUODENOSCOPY (EGD) WITH PROPOFOL N/A 02/20/2015  ? Procedure: ESOPHAGOGASTRODUODENOSCOPY (EGD) WITH PROPOFOL;  Surgeon: Carol Ada, MD;  Location: [facility];  Service: Endoscopy;  Laterality: N/A;  ? GIVENS CAPSULE STUDY N/A 03/21/2016  ? Procedure: GIVENS CAPSULE STUDY;  Surgeon: Carol Ada, MD;  Location: [facility];  Service: Endoscopy;  Laterality: N/A;  ? HAND SURGERY  1995 and 1996  ? artificial joints both hands  ? JOINT  REPLACEMENT    ? LUMBAR LAMINECTOMY/DECOMPRESSION MICRODISCECTOMY N/A 02/10/2016  ? Procedure: MICROLUMBAR DECOMPRESSION L4-L5 AND L3- L4, AND EXCISION OF SYNOVIAL CYST L4-L5;  Surgeon: Susa Day, MD;  Location: [facility];  Service: Orthopedics;  Laterality: N/A;  ? TOTAL HIP ARTHROPLASTY Left 2009  ? TOTAL HIP ARTHROPLASTY Right 04/03/2013  ? Procedure: RIGHT TOTAL HIP ARTHROPLASTY ANTERIOR APPROACH;  Surgeon: Gearlean Alf, MD;  Location: [facility];  Service: Orthopedics;  Laterality: Right;  ? ? ?Social History  ? ?Socioeconomic History  ? Marital status: Divorced  ?  Spouse name: Not on file  ? Number of children:  Not on file  ? Years of education: Not on file  ? Highest education level: Not on file  ?Occupational History  ? Occupation: Retired  ?Tobacco Use  ? Smoking status: Never  ? Smokeless tobacco: Never  ?Vaping Use  ? Vaping Use: Never used  ?Substance and Sexual Activity  ? Alcohol use: No  ?  Alcohol/week: 0.0 standard drinks  ? Drug use: No  ? Sexual activity: Not Currently  ?Other Topics Concern  ? Not on file  ?Social History Narrative  ? Not on file  ? ?Social Determinants of Health  ? ?Financial Resource Strain: Not on file  ?Food Insecurity: Not on file  ?Transportation Needs: Not on file  ?Physical Activity: Not on file  ?Stress: Not on file  ?Social Connections: Not on file  ?Intimate Partner Violence: Not on file  ? ? ?Family History  ?Problem Relation Age of Onset  ? Pneumonia Mother 44  ? Stroke Father 41  ? Heart failure Sister   ? Hypertension Sister   ? Heart attack Neg Hx   ? ? ?ROS: Arthralgias but no fevers or chills, productive cough, hemoptysis, dysphasia, odynophagia, melena, hematochezia, dysuria, hematuria, rash, seizure activity, orthopnea, PND, pedal edema, claudication. Remaining systems are negative. ? ?Physical Exam:  ? ?Blood pressure 112/66, pulse (!) 125, height '5\' 7"'$  (1.702 m). ? ?General:  Well developed/frail  in NAD ?Skin warm/dry ?Patient not depressed ?No peripheral  clubbing ?Back-normal ?HEENT-normal/normal eyelids ?Neck supple/normal carotid upstroke bilaterally; no bruits; no JVD; no thyromegaly ?chest - CTA/ normal expansion ?CV -irregular and tachycardic/normal

## 2021-07-27 NOTE — TOC Progression Note (Signed)
Transition of Care (TOC) - Progression Note  ? ? ?Patient Details  ?Name: [name] ?MRN: [id number] ?Date of Birth: [birth year] ? ?Transition of Care (TOC) CM/SW Contact  ?Milas Gain, LCSWA ?Phone Number: ?07/27/2021, 2:13 PM ? ?Clinical Narrative:    ? ?Patient is from [facility] long term. Plan is for patient to return when medically ready for dc. CSW will continue to follow and assist with patients dc planning needs. ? ?Expected Discharge Plan: [place] ?Barriers to Discharge: Continued Medical Work up ? ?Expected Discharge Plan and Services ?Expected Discharge Plan: [place] ?In-house Referral: Clinical Social Work ?  ?  ?Living arrangements for the past 2 months: [place] ?                ?  ?  ?  ?  ?  ?  ?  ?  ?  ?  ? ? ?Social Determinants of Health (SDOH) Interventions ?  ? ?Readmission Risk Interventions ? ?  07/26/2021  ?  2:12 PM 06/29/2021  ? 11:03 AM 05/21/2021  ? 11:40 AM  ?Readmission Risk Prevention Plan  ?Transportation Screening Complete Complete Complete  ?Medication Review Press photographer)  Complete Complete  ?[place] or Home Care Consult Complete Complete   ?SW Recovery Care/Counseling Consult Complete Complete Complete  ?Palliative Care Screening  Complete   ?Skilled Nursing Facility Complete Complete Not Applicable  ? ? ?

## 2021-07-28 DIAGNOSIS — I480 Paroxysmal atrial fibrillation: Secondary | ICD-10-CM | POA: Diagnosis not present

## 2021-07-28 LAB — BASIC METABOLIC PANEL
Anion gap: 8 (ref 5–15)
BUN: 10 mg/dL (ref 8–23)
CO2: 22 mmol/L (ref 22–32)
Calcium: 8.7 mg/dL — ABNORMAL LOW (ref 8.9–10.3)
Chloride: 109 mmol/L (ref 98–111)
Creatinine, Ser: 0.62 mg/dL (ref 0.44–1.00)
GFR, Estimated: >60 mL/min (ref >60)
Glucose, Bld: 57 mg/dL — ABNORMAL LOW (ref 70–99)
Potassium: 4.3 mmol/L (ref 3.5–5.1)
Sodium: 139 mmol/L (ref 135–145)

## 2021-07-28 LAB — CBC
HCT: 33.3 % — ABNORMAL LOW (ref 36.0–46.0)
Hemoglobin: 10.4 g/dL — ABNORMAL LOW (ref 12.0–15.0)
MCH: 26.7 pg (ref 26.0–34.0)
MCHC: 31.2 g/dL (ref 30.0–36.0)
MCV: 85.4 fL (ref 80.0–100.0)
Platelets: 376 10*3/uL (ref 150–400)
RBC: 3.9 MIL/uL (ref 3.87–5.11)
RDW: 19.9 % — ABNORMAL HIGH (ref 11.5–15.5)
WBC: 8.5 10*3/uL (ref 4.0–10.5)
nRBC: 0 % (ref 0.0–0.2)

## 2021-07-28 LAB — MAGNESIUM: Magnesium: 1.4 mg/dL — ABNORMAL LOW (ref 1.7–2.4)

## 2021-07-28 LAB — GLUCOSE, CAPILLARY
Glucose-Capillary: 55 mg/dL — ABNORMAL LOW (ref 70–99)
Glucose-Capillary: 56 mg/dL — ABNORMAL LOW (ref 70–99)
Glucose-Capillary: 98 mg/dL (ref 70–99)
Glucose-Capillary: 115 mg/dL — ABNORMAL HIGH (ref 70–99)

## 2021-07-28 MED ORDER — CHLORHEXIDINE GLUCONATE CLOTH 2 % EX PADS
6.0000 | MEDICATED_PAD | Freq: Every day | CUTANEOUS | Status: DC
  Administered 2021-07-29 – 2021-07-30 (×2): 6 via TOPICAL

## 2021-07-28 MED ORDER — LINEZOLID 600 MG PO TABS
600.0000 mg | ORAL_TABLET | Freq: Two times a day (BID) | ORAL | Status: AC
  Administered 2021-07-28 – 2021-07-30 (×4): 600 mg via ORAL
  Filled 2021-07-28 (×4): qty 1

## 2021-07-28 MED ORDER — LORAZEPAM 2 MG/ML IJ SOLN
2.0000 mg | Freq: Once | INTRAMUSCULAR | Status: DC
  Filled 2021-07-28: qty 1

## 2021-07-28 MED ORDER — MAGNESIUM SULFATE 2 GM/50ML IV SOLN
2.0000 g | Freq: Once | INTRAVENOUS | Status: AC
  Administered 2021-07-28: 2 g via INTRAVENOUS
  Filled 2021-07-28: qty 50

## 2021-07-28 MED ORDER — SODIUM CHLORIDE 0.9 % IV SOLN
2.0000 g | INTRAVENOUS | Status: AC
  Administered 2021-07-28: 2 g via INTRAVENOUS
  Filled 2021-07-28: qty 20

## 2021-07-28 NOTE — Progress Notes (Signed)
Foley discontinued previous shift. Pt has not voided this shift. Q6 bladder scan shows >550. Provider on call notified for I&O cath order. Jessie Foot, RN  ?

## 2021-07-28 NOTE — Progress Notes (Signed)
TRH night cross cover note: ? ?I was notified by RN that the patient's most recent postvoid residual bladder scan showed greater than 550 cc's, with associated request for order for prn straight cath in this patient whose Foley catheter was removed during dayshift on 07/27/2021.  ? ?I subsequently placed nursing communication order for prn straight cath for postvoid residual bladder scan greater than 400 cc's.  ? ? ? ? ?Babs Bertin, DO ?Hospitalist ? ?

## 2021-07-28 NOTE — TOC Progression Note (Signed)
Transition of Care (TOC) - Progression Note  ? ? ?Patient Details  ?Name: ELLYSSA ZAGAL ?MRN: [id number] ?Date of Birth: [birth year] ? ?Transition of Care (TOC) CM/SW Contact  ?Milas Gain, LCSWA ?Phone Number: ?07/28/2021, 10:13 AM ? ?Clinical Narrative:    ? ?Patient is from [facility] long term. Plan is for patient to return when medically ready for dc. CSW will continue to follow and assist with patients dc planning needs. ? ?Expected Discharge Plan: [place] ?Barriers to Discharge: Continued Medical Work up ? ?Expected Discharge Plan and Services ?Expected Discharge Plan: [place] ?In-house Referral: Clinical Social Work ?  ?  ?Living arrangements for the past 2 months: [place] ?                ?  ?  ?  ?  ?  ?  ?  ?  ?  ?  ? ? ?Social Determinants of Health (SDOH) Interventions ?  ? ?Readmission Risk Interventions ? ?  07/26/2021  ?  2:12 PM 06/29/2021  ? 11:03 AM 05/21/2021  ? 11:40 AM  ?Readmission Risk Prevention Plan  ?Transportation Screening Complete Complete Complete  ?Medication Review Press photographer)  Complete Complete  ?[place] or Home Care Consult Complete Complete   ?SW Recovery Care/Counseling Consult Complete Complete Complete  ?Palliative Care Screening  Complete   ?Skilled Nursing Facility Complete Complete Not Applicable  ? ? ?

## 2021-07-28 NOTE — Progress Notes (Signed)
I&O cath obtained 600cc clear yellow urine. Pt tolerated procedure well. Jessie Foot, RN  ?

## 2021-07-28 NOTE — Progress Notes (Signed)
?PROGRESS NOTE ? ? ?[name]  ESP:[id number] DOB: Aug 06, 1945 DOA: 07/21/2021 ?PCP: Aletha Halim., PA-C  ?Brief Narrative:  ?76 year old white female ?Known paroxysmal A-fib on Xarelto CHADVASC >4 ?HFpEF ?HLD ?HTN DM TY 2 ?Rheumatoid arthritis on long term prednisone at baseline ?Advanced dementia ?Prior iliopsoas abscess on the left side status postdrainage by IR 05/22/2021 with underlying stage II pressure ulcers previously ?Peripheral vascular disease  ? ?patient was admitted from wound care center 07/21/2021 after being found to be in rapid A-fib--heart rate was found to be in the 170s ?She was started on Cardizem gtt. magnesium was 1.4 and this was replaced ? ?Hospital-Problem based course ? ?Sepsis secondary to VRE ?HCAP in addition based on right basilar opacities on CXR ?Rocephin for HCAP ending on 4/26 ?Linezolid for VRE ending 4/28 ?Left foot cellulitis ?On Rocephin as above (initially on vancomycin and cefepime) ?A-fib RVR in the setting of sepsis and hypomagnesemia CHADVASC >4  ?hasbled score >3 secondary to AVMs and is not a candidate for systemic anticoagulation per cardiology ?Continue Xarelto 20 daily ?Magnesium this a.m. 1.4 still therefore give 2 g IV in addition to Mag-Ox 400 twice daily and recheck in a.m. ?Cardiology consulted 4/24 for difficult to control A-fib and recommended long-acting Cardizem 360, metoprolol 25 twice daily ?Rheumatoid arthritis with long-term prednisone on board ?Prior iliopsoas abscess status postdrainage 2/18 growing Enterococcus in urine however blood culture grew Proteus 3/13 ?Complicated infectious disease background ?Now being treated for VRE in urine as above ?We will discuss with family the next steps ?Acute urinary retention ?Patient presented from facility ?Hypokalemia on admission has resolved ? ?DVT prophylaxis: Xarelto 20 ?Code Status: DNR ?Family Communication: None at the bedside ?Disposition:  ?Status is: Inpatient ?Remains inpatient appropriate  because: Still undergoing treatment for sepsis ?  ?Consultants:  ?Cardiology 4/24 Dr. Debara Pickett ? ?Procedures: None at this time ? ?Antimicrobials: As per above discussion ? ? ?Subjective: ?She is pretty confused-she is somewhat combative-she does not allow for care to be given ?She is redirectable to some degree-nursing staff placed in and out Foley with 700 cc coming back-it required 4 people to place the catheter as an in and out ?She fights my attempts to examine her ? ?Objective: ?Vitals:  ? 07/28/21 0025 07/28/21 6333 07/28/21 0757 07/28/21 0917  ?BP: 113/63 119/82 (!) 131/93   ?Pulse: 99 99 (!) 126   ?Resp: '20 19 19   '$ ?Temp: 97.8 ?F (36.6 ?C) 97.9 ?F (36.6 ?C) 97.9 ?F (36.6 ?C)   ?TempSrc: Axillary Axillary Axillary   ?SpO2: 100% 98% 100% 97%  ?Weight:      ?Height:      ? ? ?Intake/Output Summary (Last 24 hours) at 07/28/2021 1025 ?Last data filed at 07/28/2021 0130 ?Gross per 24 hour  ?Intake --  ?Output 1050 ml  ?Net -1050 ml  ? ?Filed Weights  ? 07/21/21 1100  ?Weight: 63.5 kg  ? ? ?Examination: ? ?Awake coherent only to person no distress but becomes easily agitated ?No icterus no pallor-mild ocular diarrhea ?Chest is clear no added sound no rales no rhonchi no wheeze ?Abdomen is soft no rebound no guarding no tenderness ?No lower extremity edema ?With great difficulty sacrum was examined and the patient has a stage III that was present prior to admission ?No heel decubiti ? ?Data Reviewed: personally reviewed  ? ?CBC ?   ?Component Value Date/Time  ? WBC 8.5 07/28/2021 0350  ? RBC 3.90 07/28/2021 0350  ? HGB 10.4 (L) 07/28/2021 0350  ?  HGB 10.9 (L) 04/19/2017 0947  ? HGB 11.8 08/17/2015 1318  ? HGB 12.2 07/08/2014 0934  ? HCT 33.3 (L) 07/28/2021 0350  ? HCT 35.2 04/19/2017 0947  ? HCT 40.1 08/17/2015 1318  ? HCT 39.7 07/08/2014 0934  ? PLT 376 07/28/2021 0350  ? PLT 343 04/19/2017 0947  ? MCV 85.4 07/28/2021 0350  ? MCV 78 (L) 04/19/2017 0947  ? MCV 75 (L) 08/17/2015 1318  ? MCV 78.8 (L) 07/08/2014 0934  ?  MCH 26.7 07/28/2021 0350  ? MCHC 31.2 07/28/2021 0350  ? RDW 19.9 (H) 07/28/2021 0350  ? RDW 18.0 (H) 04/19/2017 0947  ? RDW 22.3 (H) 07/17/2015 1325  ? RDW 16.0 (H) 07/08/2014 0934  ? LYMPHSABS 0.9 07/23/2021 0232  ? LYMPHSABS 1.9 08/17/2015 1318  ? LYMPHSABS 2.4 07/08/2014 0934  ? MONOABS 1.0 07/23/2021 0232  ? MONOABS 1.1 (H) 07/08/2014 0934  ? EOSABS 0.0 07/23/2021 0232  ? EOSABS 0.2 08/17/2015 1318  ? BASOSABS 0.1 07/23/2021 0232  ? BASOSABS 0.0 08/17/2015 1318  ? BASOSABS 0.1 07/08/2014 0934  ? ? ?  Latest Ref Rng & Units 07/28/2021  ?  3:50 AM 07/24/2021  ?  2:59 AM 07/23/2021  ?  2:32 AM  ?CMP  ?Glucose 70 - 99 mg/dL 57   70   119    ?BUN 8 - 23 mg/dL '10   16   14    '$ ?Creatinine 0.44 - 1.00 mg/dL 0.62   0.85   0.80    ?Sodium 135 - 145 mmol/L 139   137   137    ?Potassium 3.5 - 5.1 mmol/L 4.3   3.9   4.0    ?Chloride 98 - 111 mmol/L 109   108   109    ?CO2 22 - 32 mmol/L '22   18   19    '$ ?Calcium 8.9 - 10.3 mg/dL 8.7   8.3   7.9    ?Total Protein 6.5 - 8.1 g/dL  4.9     ?Total Bilirubin 0.3 - 1.2 mg/dL  0.8     ?Alkaline Phos 38 - 126 U/L  179     ?AST 15 - 41 U/L  20     ?ALT 0 - 44 U/L  16     ? ? ? ?Radiology Studies: ?No results found. ? ? ?Scheduled Meds: ? sodium chloride   Intravenous Once  ? atorvastatin  20 mg Oral Daily  ? buPROPion ER  100 mg Oral BID  ? Chlorhexidine Gluconate Cloth  6 each Topical Q0600  ? cycloSPORINE  1 drop Both Eyes BID  ? diltiazem  360 mg Oral Daily  ? ferrous sulfate  325 mg Oral TID WC  ? gabapentin  300 mg Oral TID  ? icosapent Ethyl  1 g Oral Daily  ? insulin aspart  0-15 Units Subcutaneous TID WC  ? ketotifen  1 drop Both Eyes BID  ? linezolid  600 mg Oral Q12H  ? loratadine  10 mg Oral Daily  ? magnesium oxide  400 mg Oral BID  ? metFORMIN  1,000 mg Oral BID AC  ? metoprolol tartrate  25 mg Oral BID  ? mometasone-formoterol  2 puff Inhalation BID  ? pantoprazole  20 mg Oral Daily  ? polyvinyl alcohol  2 drop Both Eyes q AM  ? predniSONE  5 mg Oral Daily  ? rivaroxaban   20 mg Oral Q supper  ? saccharomyces boulardii  250 mg Oral BID  ?  senna  1 tablet Oral QHS  ? ?Continuous Infusions: ? cefTRIAXone (ROCEPHIN)  IV 2 g (07/27/21 1739)  ? ? ? LOS: 7 days  ? ?Time spent: 21 ? ?Nita Sells, MD ?[facility] ?To contact the attending provider between 7A-7P or the covering provider during after hours 7P-7A, please log into the web site [email or web address] and access using universal [place] password for that web site. If you do not have the password, please call the hospital operator. ? ?07/28/2021, 10:25 AM  ? ? ?

## 2021-07-29 DIAGNOSIS — I480 Paroxysmal atrial fibrillation: Secondary | ICD-10-CM | POA: Diagnosis not present

## 2021-07-29 LAB — CBC
HCT: 32.6 % — ABNORMAL LOW (ref 36.0–46.0)
Hemoglobin: 10 g/dL — ABNORMAL LOW (ref 12.0–15.0)
MCH: 26.1 pg (ref 26.0–34.0)
MCHC: 30.7 g/dL (ref 30.0–36.0)
MCV: 85.1 fL (ref 80.0–100.0)
Platelets: 368 10*3/uL (ref 150–400)
RBC: 3.83 MIL/uL — ABNORMAL LOW (ref 3.87–5.11)
RDW: 19.8 % — ABNORMAL HIGH (ref 11.5–15.5)
WBC: 8.9 10*3/uL (ref 4.0–10.5)
nRBC: 0 % (ref 0.0–0.2)

## 2021-07-29 LAB — COMPREHENSIVE METABOLIC PANEL
ALT: 14 U/L (ref 0–44)
AST: 16 U/L (ref 15–41)
Albumin: <1.5 g/dL — ABNORMAL LOW (ref 3.5–5.0)
Alkaline Phosphatase: 182 U/L — ABNORMAL HIGH (ref 38–126)
Anion gap: 6 (ref 5–15)
BUN: 11 mg/dL (ref 8–23)
CO2: 22 mmol/L (ref 22–32)
Calcium: 8.4 mg/dL — ABNORMAL LOW (ref 8.9–10.3)
Chloride: 109 mmol/L (ref 98–111)
Creatinine, Ser: 0.62 mg/dL (ref 0.44–1.00)
GFR, Estimated: >60 mL/min (ref >60)
Glucose, Bld: 95 mg/dL (ref 70–99)
Potassium: 4.2 mmol/L (ref 3.5–5.1)
Sodium: 137 mmol/L (ref 135–145)
Total Bilirubin: 0.5 mg/dL (ref 0.3–1.2)
Total Protein: 4.9 g/dL — ABNORMAL LOW (ref 6.5–8.1)

## 2021-07-29 LAB — GLUCOSE, CAPILLARY
Glucose-Capillary: 84 mg/dL (ref 70–99)
Glucose-Capillary: 150 mg/dL — ABNORMAL HIGH (ref 70–99)
Glucose-Capillary: 135 mg/dL — ABNORMAL HIGH (ref 70–99)
Glucose-Capillary: 89 mg/dL (ref 70–99)

## 2021-07-29 LAB — MAGNESIUM: Magnesium: 1.6 mg/dL — ABNORMAL LOW (ref 1.7–2.4)

## 2021-07-29 MED ORDER — FERROUS SULFATE 325 (65 FE) MG PO TABS
325.0000 mg | ORAL_TABLET | Freq: Two times a day (BID) | ORAL | Status: DC
Start: 2021-07-30 — End: 2021-07-30
  Administered 2021-07-30: 325 mg via ORAL
  Filled 2021-07-29: qty 1

## 2021-07-29 MED ORDER — MAGNESIUM SULFATE 4 GM/100ML IV SOLN
4.0000 g | Freq: Once | INTRAVENOUS | Status: AC
  Administered 2021-07-29: 4 g via INTRAVENOUS
  Filled 2021-07-29: qty 100

## 2021-07-29 MED ORDER — GABAPENTIN 100 MG PO CAPS
100.0000 mg | ORAL_CAPSULE | Freq: Two times a day (BID) | ORAL | Status: DC
  Administered 2021-07-30: 100 mg via ORAL
  Filled 2021-07-29: qty 1

## 2021-07-29 MED ORDER — OXYCODONE HCL 5 MG PO TABS: 5.0000 mg | ORAL_TABLET | Freq: Three times a day (TID) | ORAL | Status: DC | PRN

## 2021-07-29 NOTE — Progress Notes (Signed)
?PROGRESS NOTE ? ? ?[name]  EXH:[id number] DOB: 1945-07-28 DOA: 07/21/2021 ?PCP: Aletha Halim., PA-C  ?Brief Narrative:  ?76 year old white female ?Known paroxysmal A-fib on Xarelto CHADVASC >4 ?HFpEF ?HLD ?HTN DM TY 2 ?Rheumatoid arthritis on long term prednisone at baseline ?Advanced dementia ?Prior iliopsoas abscess on the left side status postdrainage by IR 05/22/2021 with underlying stage II pressure ulcers previously ?Peripheral vascular disease  ? ?patient was admitted from wound care center 07/21/2021 after being found to be in rapid A-fib--heart rate was found to be in the 170s ?She was started on Cardizem gtt. magnesium was 1.4 and this was replaced ? ?Hospital-Problem based course ? ?Sepsis secondary to VRE ?HCAP in addition based on right basilar opacities on CXR ?Rocephin for HCAP ending on 4/26 ?Linezolid for VRE ending 4/28 ?Left foot cellulitis ?Completed Rocephin as above (initially on vancomycin and cefepime) ?A-fib RVR in the setting of sepsis and hypomagnesemia CHADVASC >4  ?hasbled score >3 secondary to AVMs and is not a candidate for systemic anticoagulation per cardiology ?Continue Xarelto 20 daily ?Magnesium remains below 2--give IV mag 4 mg ?Cardiology consulted 4/24 for control A-fib  ? Cardizem 360, metoprolol 25 twice daily per them ?Stop Vascepa, atorvastatin--NO mortality benefit ?Rheumatoid arthritis with long-term prednisone on board ?Prior iliopsoas abscess status postdrainage 2/18 growing Enterococcus in urine however blood culture grew Proteus 3/13 ?Complicated infectious disease background ?Now being treated for VRE in urine as above ?Tylenol first choice for pain Oxy IR cut back to 5 mg every 8 as needed pain ?Acute urinary retention ?Patient presented from facility--she has needed I/o cath severla x's ?She will likely d/c with Foley ?At least stage V dementia ?Is orientable-continue bupropion SR 100 twice daily ?Cut back gabapentin to 100 twice daily ?[Both beers  criteria and medications] ?Consider an outpatient trazodone for sleep ?Hypokalemia on admission has resolved ? ?DVT prophylaxis: Xarelto 20 ?Code Status: DNR ?Family Communication: None at the bedside ?Disposition:  ?Status is: Inpatient ?Remains inpatient appropriate because: Still undergoing treatment for sepsis ?  ?Consultants:  ?Cardiology 4/24 Dr. Debara Pickett ? ?Procedures: None at this time ? ?Antimicrobials: As per above discussion ? ? ?Subjective: ?Awake coherent no distress ?No chest pain no fever ? ?Objective: ?Vitals:  ? 07/28/21 1921 07/29/21 0000 07/29/21 0358 07/29/21 0810  ?BP: (!) 104/56 114/65 110/74 127/85  ?Pulse: 90  82 (!) 113  ?Resp: '18  20 16  '$ ?Temp:   97.9 ?F (36.6 ?C) (!) 97 ?F (36.1 ?C)  ?TempSrc:   Axillary Axillary  ?SpO2: 95%  96% 99%  ?Weight:      ?Height:      ? ? ?Intake/Output Summary (Last 24 hours) at 07/29/2021 0955 ?Last data filed at 07/28/2021 1015 ?Gross per 24 hour  ?Intake --  ?Output 800 ml  ?Net -800 ml  ? ? ?Filed Weights  ? 07/21/21 1100  ?Weight: 63.5 kg  ? ? ?Examination: ? ?Awake coherent only to person  ?EOMI NCAT no focal deficit ?Abdomen soft no rebound no guarding ?Chest is clear no rales no rhonchi no wheeze ? ?Data Reviewed: personally reviewed  ? ?CBC ?   ?Component Value Date/Time  ? WBC 8.9 07/29/2021 0316  ? RBC 3.83 (L) 07/29/2021 0316  ? HGB 10.0 (L) 07/29/2021 0316  ? HGB 10.9 (L) 04/19/2017 0947  ? HGB 11.8 08/17/2015 1318  ? HGB 12.2 07/08/2014 0934  ? HCT 32.6 (L) 07/29/2021 0316  ? HCT 35.2 04/19/2017 0947  ? HCT 40.1 08/17/2015 1318  ?  HCT 39.7 07/08/2014 0934  ? PLT 368 07/29/2021 0316  ? PLT 343 04/19/2017 0947  ? MCV 85.1 07/29/2021 0316  ? MCV 78 (L) 04/19/2017 0947  ? MCV 75 (L) 08/17/2015 1318  ? MCV 78.8 (L) 07/08/2014 0934  ? MCH 26.1 07/29/2021 0316  ? MCHC 30.7 07/29/2021 0316  ? RDW 19.8 (H) 07/29/2021 0316  ? RDW 18.0 (H) 04/19/2017 0947  ? RDW 22.3 (H) 07/17/2015 1325  ? RDW 16.0 (H) 07/08/2014 0934  ? LYMPHSABS 0.9 07/23/2021 0232  ? LYMPHSABS  1.9 08/17/2015 1318  ? LYMPHSABS 2.4 07/08/2014 0934  ? MONOABS 1.0 07/23/2021 0232  ? MONOABS 1.1 (H) 07/08/2014 0934  ? EOSABS 0.0 07/23/2021 0232  ? EOSABS 0.2 08/17/2015 1318  ? BASOSABS 0.1 07/23/2021 0232  ? BASOSABS 0.0 08/17/2015 1318  ? BASOSABS 0.1 07/08/2014 0934  ? ? ?  Latest Ref Rng & Units 07/29/2021  ?  3:16 AM 07/28/2021  ?  3:50 AM 07/24/2021  ?  2:59 AM  ?CMP  ?Glucose 70 - 99 mg/dL 95   57   70    ?BUN 8 - 23 mg/dL '11   10   16    '$ ?Creatinine 0.44 - 1.00 mg/dL 0.62   0.62   0.85    ?Sodium 135 - 145 mmol/L 137   139   137    ?Potassium 3.5 - 5.1 mmol/L 4.2   4.3   3.9    ?Chloride 98 - 111 mmol/L 109   109   108    ?CO2 22 - 32 mmol/L '22   22   18    '$ ?Calcium 8.9 - 10.3 mg/dL 8.4   8.7   8.3    ?Total Protein 6.5 - 8.1 g/dL 4.9    4.9    ?Total Bilirubin 0.3 - 1.2 mg/dL 0.5    0.8    ?Alkaline Phos 38 - 126 U/L 182    179    ?AST 15 - 41 U/L 16    20    ?ALT 0 - 44 U/L 14    16    ? ? ? ?Radiology Studies: ?No results found. ? ? ?Scheduled Meds: ? sodium chloride   Intravenous Once  ? atorvastatin  20 mg Oral Daily  ? buPROPion ER  100 mg Oral BID  ? Chlorhexidine Gluconate Cloth  6 each Topical Q0600  ? cycloSPORINE  1 drop Both Eyes BID  ? diltiazem  360 mg Oral Daily  ? ferrous sulfate  325 mg Oral TID WC  ? gabapentin  300 mg Oral TID  ? icosapent Ethyl  1 g Oral Daily  ? insulin aspart  0-15 Units Subcutaneous TID WC  ? ketotifen  1 drop Both Eyes BID  ? linezolid  600 mg Oral Q12H  ? loratadine  10 mg Oral Daily  ? LORazepam  2 mg Intravenous Once  ? magnesium oxide  400 mg Oral BID  ? metFORMIN  1,000 mg Oral BID AC  ? metoprolol tartrate  25 mg Oral BID  ? mometasone-formoterol  2 puff Inhalation BID  ? pantoprazole  20 mg Oral Daily  ? polyvinyl alcohol  2 drop Both Eyes q AM  ? predniSONE  5 mg Oral Daily  ? rivaroxaban  20 mg Oral Q supper  ? saccharomyces boulardii  250 mg Oral BID  ? senna  1 tablet Oral QHS  ? ?Continuous Infusions: ? ? ? ? LOS: 8 days  ? ?Time spent:  73 ? ?Nita Sells, MD ?[facility] ?To contact the attending provider between 7A-7P or the covering provider during after hours 7P-7A, please log into the web site [email or web address] and access using universal [place] password for that web site. If you do not have the password, please call the hospital operator. ? ?07/29/2021, 9:55 AM  ? ? ?

## 2021-07-30 ENCOUNTER — Encounter: Payer: Self-pay | Admitting: Dermatology

## 2021-07-30 DIAGNOSIS — I480 Paroxysmal atrial fibrillation: Secondary | ICD-10-CM | POA: Diagnosis not present

## 2021-07-30 LAB — CBC
HCT: 31 % — ABNORMAL LOW (ref 36.0–46.0)
Hemoglobin: 9.1 g/dL — ABNORMAL LOW (ref 12.0–15.0)
MCH: 25.2 pg — ABNORMAL LOW (ref 26.0–34.0)
MCHC: 29.4 g/dL — ABNORMAL LOW (ref 30.0–36.0)
MCV: 85.9 fL (ref 80.0–100.0)
Platelets: 372 10*3/uL (ref 150–400)
RBC: 3.61 MIL/uL — ABNORMAL LOW (ref 3.87–5.11)
RDW: 19.9 % — ABNORMAL HIGH (ref 11.5–15.5)
WBC: 8.2 10*3/uL (ref 4.0–10.5)
nRBC: 0 % (ref 0.0–0.2)

## 2021-07-30 LAB — MAGNESIUM: Magnesium: 2.3 mg/dL (ref 1.7–2.4)

## 2021-07-30 LAB — GLUCOSE, CAPILLARY
Glucose-Capillary: 93 mg/dL (ref 70–99)
Glucose-Capillary: 114 mg/dL — ABNORMAL HIGH (ref 70–99)

## 2021-07-30 MED ORDER — METOPROLOL TARTRATE 25 MG PO TABS: 25.0000 mg | ORAL_TABLET | Freq: Two times a day (BID) | ORAL | Status: DC

## 2021-07-30 MED ORDER — OXYCODONE HCL 5 MG PO TABS: 5.0000 mg | ORAL_TABLET | Freq: Three times a day (TID) | ORAL | 0 refills | Status: AC | PRN

## 2021-07-30 MED ORDER — DILTIAZEM HCL ER COATED BEADS 360 MG PO CP24: 360.0000 mg | ORAL_CAPSULE | Freq: Every day | ORAL | Status: AC

## 2021-07-30 MED ORDER — BUPROPION HCL ER (SR) 100 MG PO TB12: 100.0000 mg | ORAL_TABLET | Freq: Two times a day (BID) | ORAL | 0 refills | Status: AC

## 2021-07-30 MED ORDER — GABAPENTIN 100 MG PO CAPS: 100.0000 mg | ORAL_CAPSULE | Freq: Two times a day (BID) | ORAL | 0 refills | Status: AC

## 2021-07-30 MED ORDER — INSULIN GLARGINE 100 UNIT/ML ~~LOC~~ SOLN: 12.0000 [IU] | Freq: Every day | SUBCUTANEOUS | 11 refills | Status: DC

## 2021-07-30 MED ORDER — PANTOPRAZOLE SODIUM 20 MG PO TBEC: 20.0000 mg | DELAYED_RELEASE_TABLET | Freq: Every day | ORAL | 1 refills | Status: DC

## 2021-07-30 MED ORDER — FERROUS SULFATE 325 (65 FE) MG PO TABS
325.0000 mg | ORAL_TABLET | Freq: Two times a day (BID) | ORAL | 3 refills | Status: AC
Start: 2021-07-30 — End: ?

## 2021-07-30 NOTE — Progress Notes (Signed)
? ?  New Patient ?  ?Subjective  ?[name] is a 76 y.o. female who presents for the following: Annual Exam and Skin Problem (Pt states she is having really bad dry skin on her face x 3 months. ). ? ?Check spots on her face plus other areas ?Location:  ?Duration:  ?Quality:  ?Associated Signs/Symptoms: ?Modifying Factors:  ?Severity:  ?Timing: ?Context:  ? ? ?The following portions of the chart were reviewed this encounter and updated as appropriate:  Tobacco  Allergies  Meds  Problems  Med Hx  Surg Hx  Fam Hx   ?  ? ?Objective  ?Well appearing patient in no apparent distress; mood and affect are within normal limits. ?Left Temple, Mid Forehead ?Actinic keratoses are relatively small (2 to 4 mm) and flat apparently not too bothersome to patient. ? ?Head ?Pink dry patches are likely a mixture of chronic UV damage plus mild facial eczema. ? ? ? ?All sun exposed areas plus back examined. ? ? ?Assessment & Plan  ?AK (actinic keratosis) (2) ?Mid Forehead; Left Temple ? ?Intervention deferred. ? ?Seborrheic dermatitis ?Head ? ?Family can contact me if eczema worsens and we will prescribe Hytone ointment. ? ? ?

## 2021-07-30 NOTE — Discharge Summary (Addendum)
Physician Discharge Summary  ?BRUNETTA NEWINGHAM NFA:[id number] DOB: 1945-10-08 DOA: 07/21/2021 ? ?PCP: Aletha Halim., PA-C ? ?Admit date: 07/21/2021 ?Discharge date: 07/30/2021 ? ?Time spent: 36 minutes ? ?Recommendations for Outpatient Follow-up:  ?Requires outpatient labs CBC, bmet in about 3 to 5 days ?Note dosage changes of several medications including lower dose of metoprolol higher dose of Cardizem discontinuation of metformin and decreased dose of Lantus and lispro insulin ?Requires goals of care in the outpatient setting given moderate to severe dementia ?Recommend lowest dose possible of prednisone for rheumatoid arthritis-patient will continue to have risks for infection while on this continuously-PCP to coordinate rheumatological follow-up ? not a candidate for anticoagulation going forward despite elevated CHADS2 score given prior AVM bleeds ? ?Discharge Diagnoses:  ?MAIN problem for hospitalization  ? ?Rapid A-fib secondary to noncompliance on medications ?Sepsis on admission secondary to HCAP ? ?Please see below for itemized issues addressed in HOpsital- ?refer to other progress notes for clarity if needed ? ?Discharge Condition: Guarded ? ?Diet recommendation: Regular ? ?Filed Weights  ? 07/21/21 1100  ?Weight: 63.5 kg  ? ? ?History of present illness:  ?76 year old white female ?Known paroxysmal A-fib on Xarelto CHADVASC >4 ?HFpEF ?HLD ?HTN DM TY 2 ?Rheumatoid arthritis on long term prednisone at baseline ?Advanced dementia at least age 77-77 ?Prior iliopsoas abscess on the left side status postdrainage by IR 05/22/2021 with underlying stage II pressure ulcers previously ?Peripheral vascular disease  ?  ?patient was admitted from wound care center 07/21/2021 after being found to be in rapid A-fib--heart rate was found to be in the 170s ?She was started on Cardizem gtt. magnesium was 1.4 and this was replaced ? ?Hospital Course:  ?Sepsis secondary to VRE ?HCAP in addition based on right basilar opacities on  CXR ?Rocephin for HCAP ending on 4/26 ?Linezolid for VRE ending 4/28 ?Left foot cellulitis ?Completed Rocephin as above (initially on vancomycin and cefepime) ?A-fib RVR in the setting of sepsis and hypomagnesemia CHADVASC >4  ?hasbled score >3 secondary to AVMs and is not a candidate for systemic anticoagulation per cardiology ?Stopped Xarelto 20 daily ?Magnesium remains below 2--give IV mag 4 mg magnesium did improve on discharge to 2.3 ?Cardiology consulted 4/24 for control A-fib  ? Cardizem 360, metoprolol 25 twice daily per them ?Stop Vascepa, atorvastatin--NO mortality benefit ?Rheumatoid arthritis with long-term prednisone on board ?Prior iliopsoas abscess status postdrainage 2/18 growing Enterococcus in urine however blood culture grew Proteus 3/13 ?Complicated infectious disease background ?Now being treated for VRE in urine as above ?Tylenol first choice for pain Oxy IR cut back significantly on discharge ?Acute urinary retention ?Patient presented from facility--she has needed I/o cath severla x's ?She will d/c with Foley ?At least stage V dementia ?Is orientable-continue bupropion SR 100 twice daily ?Cut back gabapentin to 100 twice daily ?[Both beers criteria and medications] ?Consider an outpatient trazodone for sleep ?Hypokalemia on admission has resolved ? ? ?Discharge Exam: ?Vitals:  ? 07/30/21 0410 07/30/21 0844  ?BP: 110/65 129/85  ?Pulse: 89 85  ?Resp: 20 18  ?Temp: 97.8 ?F (36.6 ?C) 97.8 ?F (36.6 ?C)  ?SpO2: 96% 100%  ? ? ?Subj on day of d/c ?  ?Confused but pleasantly so-asking me if I can give her the keys to the car that she is going to start ?She does not know that she is in the hospital but is redirectable ? ?General Exam on discharge ? ?EOMI NCAT no focal deficit but is confused ?Chest is clear no rales rhonchi ?Abdomen  is soft ?No lower extremity edema ? ?Discharge Instructions ? ? ?Discharge Instructions   ? ? Diet - low sodium heart healthy   Complete by: As directed ?  ? Discharge  wound care:   Complete by: As directed ?  ? As above  ? Increase activity slowly   Complete by: As directed ?  ? ?  ? ?Allergies as of 07/30/2021   ? ?   Reactions  ? Promethazine Other (See Comments)  ? Not listed on MAR, unknown reaction  ? ?  ? ?  ?Medication List  ?  ? ?STOP taking these medications   ? ?atorvastatin 20 MG tablet ?Commonly known as: LIPITOR ?  ?Calcium 1200 1200-1000 MG-UNIT Chew ?  ?Calmoseptine 0.44-20.6 % Oint ?Generic drug: Menthol-Zinc Oxide ?  ?cefTRIAXone 1 g injection ?Commonly known as: ROCEPHIN ?  ?cetirizine 10 MG tablet ?Commonly known as: ZYRTEC ?  ?dextrose 5 % solution ?  ?diltiazem 240 MG 24 hr tablet ?Commonly known as: Cardizem LA ?Replaced by: diltiazem 360 MG 24 hr capsule ?  ?feeding supplement (PRO-STAT 64) Liqd ?  ?feeding supplement Liqd ?  ?ferrous sulfate 325 (65 FE) MG EC tablet ?Replaced by: ferrous sulfate 325 (65 FE) MG tablet ?  ?furosemide 20 MG tablet ?Commonly known as: LASIX ?  ?furosemide 40 MG tablet ?Commonly known as: LASIX ?  ?metFORMIN 500 MG tablet ?Commonly known as: GLUCOPHAGE ?  ?multivitamin tablet ?  ?OcuSoft Lid Scrub Original Pads ?  ?polyethylene glycol 17 g packet ?Commonly known as: MIRALAX / GLYCOLAX ?  ?PRESERVISION AREDS PO ?  ?rivaroxaban 20 MG Tabs tablet ?Commonly known as: XARELTO ?  ?senna 8.6 MG Tabs tablet ?Commonly known as: SENOKOT ?  ?Vascepa 1 g capsule ?Generic drug: icosapent Ethyl ?  ?zinc sulfate 220 (50 Zn) MG capsule ?  ? ?  ? ?TAKE these medications   ? ?acetaminophen 650 MG CR tablet ?Commonly known as: TYLENOL ?Take 1,300 mg by mouth in the morning and at bedtime. ?  ?albuterol 108 (90 Base) MCG/ACT inhaler ?Commonly known as: VENTOLIN HFA ?Inhale 2 puffs into the lungs every 4 (four) hours as needed for wheezing or shortness of breath. ?  ?Aquaphor Adv Therapy Healing Oint ?Apply 1 application. topically See admin instructions. To rash on face in the evening every Monday, Thursday ?  ?Artificial Tears 1 % ophthalmic  solution ?Generic drug: carboxymethylcellulose ?Place 2 drops into both eyes in the morning. ?  ?azelastine 0.05 % ophthalmic solution ?Commonly known as: OPTIVAR ?Place 1 drop into both eyes 2 (two) times daily. ?  ?buPROPion ER 100 MG 12 hr tablet ?Commonly known as: Wellbutrin SR ?Take 1 tablet (100 mg total) by mouth 2 (two) times daily. ?  ?cycloSPORINE 0.05 % ophthalmic emulsion ?Commonly known as: RESTASIS ?Place 1 drop into both eyes 2 (two) times daily. ?  ?diclofenac Sodium 1 % Gel ?Commonly known as: VOLTAREN ?Apply 2 g topically daily as needed for pain. ?  ?diltiazem 360 MG 24 hr capsule ?Commonly known as: CARDIZEM CD ?Take 1 capsule (360 mg total) by mouth daily. ?Start taking on: July 31, 2021 ?Replaces: diltiazem 240 MG 24 hr tablet ?  ?ferrous sulfate 325 (65 FE) MG tablet ?Take 1 tablet (325 mg total) by mouth 2 (two) times daily with a meal. ?Replaces: ferrous sulfate 325 (65 FE) MG EC tablet ?  ?fluticasone-salmeterol 115-21 MCG/ACT inhaler ?Commonly known as: ADVAIR HFA ?Inhale 2 puffs into the lungs 2 times daily at 12 noon and  4 pm. ?  ?gabapentin 100 MG capsule ?Commonly known as: NEURONTIN ?Take 1 capsule (100 mg total) by mouth 2 (two) times daily. ?What changed:  ?medication strength ?how much to take ?when to take this ?Another medication with the same name was removed. Continue taking this medication, and follow the directions you see here. ?  ?insulin glargine 100 UNIT/ML injection ?Commonly known as: LANTUS ?Inject 0.12 mLs (12 Units total) into the skin daily. Hold if BS is less than 150 ?What changed: how much to take ?  ?insulin lispro 100 UNIT/ML injection ?Commonly known as: HUMALOG ?Inject 6 Units into the skin in the morning and at bedtime. Hold if BS <100 ?  ?magnesium oxide 400 MG tablet ?Commonly known as: MAG-OX ?Take 400 mg by mouth 2 (two) times daily. ?  ?metoprolol tartrate 25 MG tablet ?Commonly known as: LOPRESSOR ?Take 1 tablet (25 mg total) by mouth 2 (two) times  daily. ?What changed:  ?medication strength ?how much to take ?  ?nitroGLYCERIN 0.4 MG SL tablet ?Commonly known as: NITROSTAT ?Place 1 tablet (0.4 mg total) under the tongue every 5 (five) minutes as needed f

## 2021-07-30 NOTE — TOC Transition Note (Addendum)
Transition of Care (TOC) - CM/SW Discharge Note ? ? ?Patient Details  ?Name: [name] ?MRN: [id number] ?Date of Birth: [birth year] ? ?Transition of Care (TOC) CM/SW Contact:  ?Milas Gain, LCSWA ?Phone Number: ?07/30/2021, 12:21 PM ? ? ?Clinical Narrative:    ? ?Patient will DC to: [facility] ? ?Anticipated DC date: 07/30/2021 ? ?Family notified: Fara Olden- CSW tried to call several times. CSW LVM. Evelena Peat with [employer] approved to send patient back to [facility] long term. ? ?Transport by: [facility] ? ?? ? ?Per MD patient ready for DC to [facility] . RN, patient, patient's family, and facility notified of DC. Discharge Summary sent to facility. RN given number for report tele# [phone]. DC packet on chart. DNR signed by MD attached to patients DC packet.Ambulance transport requested for patient. ? ?CSW signing off.  ? ?Final next level of care: [facility] ?Barriers to Discharge: No Barriers Identified ? ? ?Patient Goals and CMS Choice ?  ?CMS Medicare.gov Compare Post Acute Care list provided to:: Patient Represenative (must comment) (patients son Fara Olden) ?Choice offered to / list presented to : Adult Children (patients son Fara Olden) ? ?Discharge Placement ?  ?           ?Patient chooses bed at: [facility] ?Patient to be transferred to facility by: [facility] ?Name of family member notified: Fara Olden ?Patient and family notified of of transfer: 07/30/21 ? ?Discharge Plan and Services ?In-house Referral: Clinical Social Work ?  ?           ?  ?  ?  ?  ?  ?  ?  ?  ?  ?  ? ?Social Determinants of Health (SDOH) Interventions ?  ? ? ?Readmission Risk Interventions ? ?  07/26/2021  ?  2:12 PM 06/29/2021  ? 11:03 AM 05/21/2021  ? 11:40 AM  ?Readmission Risk Prevention Plan  ?Transportation Screening Complete Complete Complete  ?Medication Review Press photographer)  Complete Complete  ?[place] or Home Care Consult Complete Complete   ?SW Recovery Care/Counseling Consult Complete Complete Complete  ?Palliative  Care Screening  Complete   ?Skilled Nursing Facility Complete Complete Not Applicable  ? ? ? ? ? ?

## 2021-07-30 NOTE — Care Management Important Message (Signed)
Important Message ? ?Patient Details  ?Name: [name] ?MRN: [id number] ?Date of Birth: [birth year] ? ? ?Medicare Important Message Given:  Yes ? ? ? ? ?Shelda Altes ?07/30/2021, 7:48 AM ?

## 2021-07-30 NOTE — Progress Notes (Signed)
Call attempted to [facility] to give a report.  No answer. ? ?Idolina Primer, RN ?

## 2021-07-30 NOTE — Progress Notes (Signed)
Clarified with Dr. Verlon Au regarding xarelto order.  Xarelto is stopped.  [facility] stop [facility]. ? ?Idolina Primer, RN ?

## 2021-07-30 NOTE — Progress Notes (Signed)
Attempted to call for a report x2 for [facility]. No answer. ? ?Idolina Primer, RN ?

## 2021-08-02 ENCOUNTER — Emergency Department (HOSPITAL_COMMUNITY): Payer: 59

## 2021-08-02 ENCOUNTER — Encounter (HOSPITAL_COMMUNITY): Payer: Self-pay | Admitting: Emergency Medicine

## 2021-08-02 ENCOUNTER — Inpatient Hospital Stay (HOSPITAL_COMMUNITY)
Admission: EM | Admit: 2021-08-02 | Discharge: 2021-08-05 | DRG: 884 | Disposition: A | Discharge status: Skilled Nursing Facility | Payer: 59 | Source: Skilled Nursing Facility | Attending: Internal Medicine | Admitting: Internal Medicine

## 2021-08-02 DIAGNOSIS — E785 Hyperlipidemia, unspecified: Secondary | ICD-10-CM | POA: Diagnosis present

## 2021-08-02 DIAGNOSIS — E1151 Type 2 diabetes mellitus with diabetic peripheral angiopathy without gangrene: Secondary | ICD-10-CM | POA: Diagnosis present

## 2021-08-02 DIAGNOSIS — Z794 Long term (current) use of insulin: Secondary | ICD-10-CM | POA: Diagnosis not present

## 2021-08-02 DIAGNOSIS — Z79899 Other long term (current) drug therapy: Secondary | ICD-10-CM

## 2021-08-02 DIAGNOSIS — R402411 Glasgow coma scale score 13-15, in the field [EMT or ambulance]: Principal | ICD-10-CM

## 2021-08-02 DIAGNOSIS — I4891 Unspecified atrial fibrillation: Secondary | ICD-10-CM | POA: Diagnosis present

## 2021-08-02 DIAGNOSIS — Z8249 Family history of ischemic heart disease and other diseases of the circulatory system: Secondary | ICD-10-CM | POA: Diagnosis not present

## 2021-08-02 DIAGNOSIS — R6510 Systemic inflammatory response syndrome (SIRS) of non-infectious origin without acute organ dysfunction: Secondary | ICD-10-CM | POA: Diagnosis not present

## 2021-08-02 DIAGNOSIS — Z823 Family history of stroke: Secondary | ICD-10-CM

## 2021-08-02 DIAGNOSIS — F03911 Unspecified dementia, unspecified severity, with agitation: Principal | ICD-10-CM | POA: Diagnosis present

## 2021-08-02 DIAGNOSIS — A419 Sepsis, unspecified organism: Secondary | ICD-10-CM

## 2021-08-02 DIAGNOSIS — R680 Hypothermia, not associated with low environmental temperature: Secondary | ICD-10-CM | POA: Diagnosis present

## 2021-08-02 DIAGNOSIS — D649 Anemia, unspecified: Secondary | ICD-10-CM | POA: Diagnosis present

## 2021-08-02 DIAGNOSIS — Z96641 Presence of right artificial hip joint: Secondary | ICD-10-CM | POA: Diagnosis present

## 2021-08-02 DIAGNOSIS — K219 Gastro-esophageal reflux disease without esophagitis: Secondary | ICD-10-CM | POA: Diagnosis present

## 2021-08-02 DIAGNOSIS — F01B11 Vascular dementia, moderate, with agitation: Secondary | ICD-10-CM | POA: Diagnosis not present

## 2021-08-02 DIAGNOSIS — Z515 Encounter for palliative care: Secondary | ICD-10-CM | POA: Diagnosis not present

## 2021-08-02 DIAGNOSIS — Z87440 Personal history of urinary (tract) infections: Secondary | ICD-10-CM

## 2021-08-02 DIAGNOSIS — G9341 Metabolic encephalopathy: Secondary | ICD-10-CM | POA: Diagnosis present

## 2021-08-02 DIAGNOSIS — E1169 Type 2 diabetes mellitus with other specified complication: Secondary | ICD-10-CM | POA: Diagnosis present

## 2021-08-02 DIAGNOSIS — I5032 Chronic diastolic (congestive) heart failure: Secondary | ICD-10-CM | POA: Diagnosis present

## 2021-08-02 DIAGNOSIS — T68XXXA Hypothermia, initial encounter: Secondary | ICD-10-CM

## 2021-08-02 DIAGNOSIS — Z66 Do not resuscitate: Secondary | ICD-10-CM | POA: Diagnosis present

## 2021-08-02 DIAGNOSIS — I110 Hypertensive heart disease with heart failure: Secondary | ICD-10-CM | POA: Diagnosis present

## 2021-08-02 DIAGNOSIS — N390 Urinary tract infection, site not specified: Secondary | ICD-10-CM | POA: Diagnosis not present

## 2021-08-02 DIAGNOSIS — Z7952 Long term (current) use of systemic steroids: Secondary | ICD-10-CM | POA: Diagnosis not present

## 2021-08-02 DIAGNOSIS — R6520 Severe sepsis without septic shock: Secondary | ICD-10-CM | POA: Diagnosis not present

## 2021-08-02 DIAGNOSIS — L89152 Pressure ulcer of sacral region, stage 2: Secondary | ICD-10-CM | POA: Diagnosis present

## 2021-08-02 DIAGNOSIS — I480 Paroxysmal atrial fibrillation: Secondary | ICD-10-CM | POA: Diagnosis not present

## 2021-08-02 DIAGNOSIS — Z86718 Personal history of other venous thrombosis and embolism: Secondary | ICD-10-CM

## 2021-08-02 DIAGNOSIS — Z7951 Long term (current) use of inhaled steroids: Secondary | ICD-10-CM | POA: Diagnosis not present

## 2021-08-02 DIAGNOSIS — G9340 Encephalopathy, unspecified: Secondary | ICD-10-CM | POA: Diagnosis not present

## 2021-08-02 DIAGNOSIS — Z20822 Contact with and (suspected) exposure to covid-19: Secondary | ICD-10-CM | POA: Diagnosis present

## 2021-08-02 DIAGNOSIS — M069 Rheumatoid arthritis, unspecified: Secondary | ICD-10-CM | POA: Diagnosis present

## 2021-08-02 DIAGNOSIS — Z7189 Other specified counseling: Secondary | ICD-10-CM | POA: Diagnosis not present

## 2021-08-02 DIAGNOSIS — I10 Essential (primary) hypertension: Secondary | ICD-10-CM | POA: Diagnosis present

## 2021-08-02 LAB — BLOOD GAS, VENOUS
Acid-Base Excess: 0.7 mmol/L (ref 0.0–2.0)
Bicarbonate: 26.6 mmol/L (ref 20.0–28.0)
Drawn by: 442
FIO2: 28 %
O2 Saturation: 75.1 %
Patient temperature: 34.1
pCO2, Ven: 41 mmHg — ABNORMAL LOW (ref 44–60)
pH, Ven: 7.4 (ref 7.25–7.43)
pO2, Ven: 38 mmHg (ref 32–45)

## 2021-08-02 LAB — COMPREHENSIVE METABOLIC PANEL
ALT: 19 U/L (ref 0–44)
AST: 19 U/L (ref 15–41)
Albumin: 2.4 g/dL — ABNORMAL LOW (ref 3.5–5.0)
Alkaline Phosphatase: 229 U/L — ABNORMAL HIGH (ref 38–126)
Anion gap: 9 (ref 5–15)
BUN: 17 mg/dL (ref 8–23)
CO2: 24 mmol/L (ref 22–32)
Calcium: 8.7 mg/dL — ABNORMAL LOW (ref 8.9–10.3)
Chloride: 107 mmol/L (ref 98–111)
Creatinine, Ser: 0.56 mg/dL (ref 0.44–1.00)
GFR, Estimated: >60 mL/min (ref >60)
Glucose, Bld: 133 mg/dL — ABNORMAL HIGH (ref 70–99)
Potassium: 3.8 mmol/L (ref 3.5–5.1)
Sodium: 140 mmol/L (ref 135–145)
Total Bilirubin: 0.4 mg/dL (ref 0.3–1.2)
Total Protein: 6.9 g/dL (ref 6.5–8.1)

## 2021-08-02 LAB — CBC WITH DIFFERENTIAL/PLATELET
Abs Immature Granulocytes: 0.24 10*3/uL — ABNORMAL HIGH (ref 0.00–0.07)
Basophils Absolute: 0.1 10*3/uL (ref 0.0–0.1)
Basophils Relative: 1 %
Eosinophils Absolute: 0 10*3/uL (ref 0.0–0.5)
Eosinophils Relative: 0 %
HCT: 36.2 % (ref 36.0–46.0)
Hemoglobin: 10.8 g/dL — ABNORMAL LOW (ref 12.0–15.0)
Immature Granulocytes: 2 %
Lymphocytes Relative: 5 %
Lymphs Abs: 0.6 10*3/uL — ABNORMAL LOW (ref 0.7–4.0)
MCH: 25.8 pg — ABNORMAL LOW (ref 26.0–34.0)
MCHC: 29.8 g/dL — ABNORMAL LOW (ref 30.0–36.0)
MCV: 86.6 fL (ref 80.0–100.0)
Monocytes Absolute: 0.6 10*3/uL (ref 0.1–1.0)
Monocytes Relative: 4 %
Neutro Abs: 12 10*3/uL — ABNORMAL HIGH (ref 1.7–7.7)
Neutrophils Relative %: 88 %
Platelets: 385 10*3/uL (ref 150–400)
RBC: 4.18 MIL/uL (ref 3.87–5.11)
RDW: 20.4 % — ABNORMAL HIGH (ref 11.5–15.5)
WBC: 13.5 10*3/uL — ABNORMAL HIGH (ref 4.0–10.5)
nRBC: 0 % (ref 0.0–0.2)

## 2021-08-02 LAB — URINALYSIS, ROUTINE W REFLEX MICROSCOPIC
Bilirubin Urine: NEGATIVE
Glucose, UA: NEGATIVE mg/dL
Ketones, ur: NEGATIVE mg/dL
Nitrite: POSITIVE — AB
Protein, ur: 30 mg/dL — AB
Specific Gravity, Urine: 1.003 — ABNORMAL LOW (ref 1.005–1.030)
WBC, UA: >50 WBC/hpf — ABNORMAL HIGH (ref 0–5)
pH: 7 (ref 5.0–8.0)

## 2021-08-02 LAB — RESP PANEL BY RT-PCR (FLU A&B, COVID) ARPGX2
Influenza A by PCR: NEGATIVE
Influenza B by PCR: NEGATIVE
SARS Coronavirus 2 by RT PCR: NEGATIVE

## 2021-08-02 LAB — CBG MONITORING, ED
Glucose-Capillary: 147 mg/dL — ABNORMAL HIGH (ref 70–99)
Glucose-Capillary: 99 mg/dL (ref 70–99)

## 2021-08-02 LAB — LACTIC ACID, PLASMA
Lactic Acid, Venous: 1.7 mmol/L (ref 0.5–1.9)
Lactic Acid, Venous: 1.7 mmol/L (ref 0.5–1.9)

## 2021-08-02 LAB — RAPID URINE DRUG SCREEN, HOSP PERFORMED
Amphetamines: NOT DETECTED
Barbiturates: NOT DETECTED
Benzodiazepines: NOT DETECTED
Cocaine: NOT DETECTED
Opiates: NOT DETECTED
Tetrahydrocannabinol: NOT DETECTED

## 2021-08-02 LAB — PROTIME-INR
INR: 1.2 (ref 0.8–1.2)
Prothrombin Time: 14.6 seconds (ref 11.4–15.2)

## 2021-08-02 LAB — MAGNESIUM: Magnesium: 1.8 mg/dL (ref 1.7–2.4)

## 2021-08-02 MED ORDER — SODIUM CHLORIDE 0.9 % IV SOLN
2.0000 g | Freq: Once | INTRAVENOUS | Status: AC
  Administered 2021-08-02: 2 g via INTRAVENOUS
  Filled 2021-08-02: qty 12.5

## 2021-08-02 MED ORDER — LACTATED RINGERS IV SOLN: INTRAVENOUS | Status: DC

## 2021-08-02 MED ORDER — NALOXONE HCL 0.4 MG/ML IJ SOLN
0.2000 mg | Freq: Once | INTRAMUSCULAR | Status: AC
  Administered 2021-08-02: 0.2 mg via INTRAVENOUS
  Filled 2021-08-02: qty 1

## 2021-08-02 MED ORDER — SODIUM CHLORIDE 0.9 % IV SOLN: 2.0000 g | Freq: Three times a day (TID) | INTRAVENOUS | Status: DC

## 2021-08-02 MED ORDER — METRONIDAZOLE 500 MG/100ML IV SOLN
500.0000 mg | Freq: Once | INTRAVENOUS | Status: AC
  Administered 2021-08-02: 500 mg via INTRAVENOUS
  Filled 2021-08-02: qty 100

## 2021-08-02 MED ORDER — VANCOMYCIN HCL IN DEXTROSE 1-5 GM/200ML-% IV SOLN: 1000.0000 mg | Freq: Once | INTRAVENOUS | Status: DC

## 2021-08-02 MED ORDER — LACTATED RINGERS IV BOLUS (SEPSIS)
500.0000 mL | Freq: Once | INTRAVENOUS | Status: AC
  Administered 2021-08-02: 500 mL via INTRAVENOUS

## 2021-08-02 MED ORDER — IOHEXOL 350 MG/ML SOLN
100.0000 mL | Freq: Once | INTRAVENOUS | Status: AC | PRN
  Administered 2021-08-02: 75 mL via INTRAVENOUS

## 2021-08-02 MED ORDER — LACTATED RINGERS IV BOLUS
1000.0000 mL | Freq: Once | INTRAVENOUS | Status: AC
  Administered 2021-08-02: 1000 mL via INTRAVENOUS

## 2021-08-02 MED ORDER — LINEZOLID 600 MG/300ML IV SOLN
600.0000 mg | Freq: Two times a day (BID) | INTRAVENOUS | Status: DC
  Administered 2021-08-02 – 2021-08-04 (×5): 600 mg via INTRAVENOUS
  Filled 2021-08-02 (×9): qty 300

## 2021-08-02 NOTE — Progress Notes (Signed)
Pharmacy Antibiotic Note ? ?[name] [name] is a 76 y.o. female admitted on 08/02/2021 with  unknown source .  Pharmacy has been consulted for cefepime and vancomycin dosing. ? ?Plan: ?After discussion with Dr. Doren Custard, antibiotics will be changed to cefepime and linezolid. Pt was just discharged 4/28 after treatment for VRE in urine and HCAP. ? ?Height: '5\' 7"'$  (170.2 cm) ?Weight: 63.5 kg (140 lb) ?IBW/kg (Calculated) : 61.6 ? ?Temp (24hrs), Avg:93.4 ?F (34.1 ?C), Min:93.4 ?F (34.1 ?C), Max:93.4 ?F (34.1 ?C) ? ?Recent Labs  ?Lab 07/27/21 ?6644 07/28/21 ?0350 07/29/21 ?0347 07/30/21 ?0303 08/02/21 ?1626  ?WBC 8.9 8.5 8.9 8.2 13.5*  ?CREATININE  --  0.62 0.62  --  0.56  ?LATICACIDVEN  --   --   --   --  1.7  ?  ?Estimated Creatinine Clearance: 58.2 mL/min (by C-G formula based on SCr of 0.56 mg/dL).   ? ?Allergies  ?Allergen Reactions  ? Promethazine Other (See Comments)  ?  Not listed on MAR, unknown reaction  ? ? ?Antimicrobials this admission: ?cefepime 5/1 >> ?Linezolid 5/1 >> ? ?Microbiology results: ?5/1 BCx: pending ?5/1 UCx: pending  ? ?Thank you for allowing pharmacy to be a part of this patientChristinas care. ? ?[name] [name] ?08/02/2021 6:36 PM ? ?

## 2021-08-02 NOTE — ED Notes (Signed)
Changed foley bag to obtain urine.  ?

## 2021-08-02 NOTE — ED Notes (Signed)
Bair hugger applied.

## 2021-08-02 NOTE — ED Provider Notes (Addendum)
?[place] ?Provider Note ? ? ?CSN: [id number] ?Arrival date & time: 08/02/21  1537 ? ?  ? ?History ? ?Chief Complaint  ?Patient presents with  ? Fatigue  ? ? ?[name] is a 76 y.o. female. ? ?HPI ?Patient presents from nursing facility.  Her baseline is described as follows: She has dementia and history of stroke.  She is normally able to feed herself.  This morning, she was noted to be lethargic.  Her blood sugar was checked and found to be in the 40s.  She was arousable enough to give oral glucose to, which was given.  She also received a shot of glucagon.  When EMS checked her blood sugar, it was in the 150s.  She remained somnolent.  She did have a recent hospital admission for sepsis secondary to VRE.  Was treated with Rocephin and linezolid.  During the hospitalization, she had A-fib with RVR.  She is not a candidate for anticoagulation due to history of AVMs.  Her Xarelto was stopped.  She was kept on Cardizem and metoprolol.  She was discharged with a Foley catheter and as well as supplemental oxygen.  This discharge was 3 days ago.  Patient currently denies any areas of discomfort. ?  ? ?Home Medications ?Prior to Admission medications   ?Medication Sig Start Date End Date Taking? Authorizing Provider  ?acetaminophen (TYLENOL) 650 MG CR tablet Take 1,300 mg by mouth in the morning and at bedtime.   Yes [provider]  ?albuterol (VENTOLIN HFA) 108 (90 Base) MCG/ACT inhaler Inhale 2 puffs into the lungs every 4 (four) hours as needed for wheezing or shortness of breath.   Yes [provider]  ?azelastine (OPTIVAR) 0.05 % ophthalmic solution Place 1 drop into both eyes 2 (two) times daily.   Yes [provider]  ?buPROPion ER (WELLBUTRIN SR) 100 MG 12 hr tablet Take 1 tablet (100 mg total) by mouth 2 (two) times daily. 07/30/21  Yes Nita Sells, MD  ?carboxymethylcellulose (ARTIFICIAL TEARS) 1 % ophthalmic solution Place 2 drops into both eyes in  the morning.   Yes [provider]  ?cycloSPORINE (RESTASIS) 0.05 % ophthalmic emulsion Place 1 drop into both eyes 2 (two) times daily.   Yes [provider]  ?diclofenac Sodium (VOLTAREN) 1 % GEL Apply 2 g topically daily as needed for pain. 03/15/20  Yes [provider]  ?diltiazem (CARDIZEM CD) 360 MG 24 hr capsule Take 1 capsule (360 mg total) by mouth daily. 07/31/21  Yes Nita Sells, MD  ?ferrous sulfate 325 (65 FE) MG tablet Take 1 tablet (325 mg total) by mouth 2 (two) times daily with a meal. 07/30/21  Yes Nita Sells, MD  ?fluticasone-salmeterol (ADVAIR HFA) 115-21 MCG/ACT inhaler Inhale 2 puffs into the lungs 2 times daily at 12 noon and 4 pm.   Yes [provider]  ?gabapentin (NEURONTIN) 100 MG capsule Take 1 capsule (100 mg total) by mouth 2 (two) times daily. 07/30/21  Yes Nita Sells, MD  ?insulin glargine (LANTUS) 100 UNIT/ML injection Inject 0.12 mLs (12 Units total) into the skin daily. Hold if BS is less than 150 07/30/21  Yes Nita Sells, MD  ?insulin lispro (HUMALOG) 100 UNIT/ML injection Inject 6 Units into the skin in the morning and at bedtime. Hold if BS <100   Yes [provider]  ?magnesium oxide (MAG-OX) 400 MG tablet Take 400 mg by mouth 2 (two) times daily.   Yes [provider]  ?metoprolol  tartrate (LOPRESSOR) 25 MG tablet Take 1 tablet (25 mg total) by mouth 2 (two) times daily. 07/30/21  Yes Nita Sells, MD  ?nitroGLYCERIN (NITROSTAT) 0.4 MG SL tablet Place 1 tablet (0.4 mg total) under the tongue every 5 (five) minutes as needed for chest pain (x 3 doses). 11/28/16  Yes Lelon Perla, MD  ?omeprazole (PRILOSEC OTC) 20 MG tablet Take 20 mg by mouth daily.   Yes [provider]  ?pantoprazole (PROTONIX) 20 MG tablet Take 1 tablet (20 mg total) by mouth daily. 07/31/21  Yes Nita Sells, MD  ?Pollen Extracts (PROSTAT PO) Take 30 mLs by mouth daily.   Yes [provider]  ?predniSONE (DELTASONE) 5 MG tablet Take 5 mg by mouth daily.   Yes [provider]  ?saccharomyces boulardii (FLORASTOR) 250 MG capsule Take 250 mg by mouth daily.   Yes [provider]  ?vitamin C (ASCORBIC ACID) 500 MG tablet Take 500 mg by mouth 2 (two) times daily.   Yes [provider]  ?zinc gluconate 50 MG tablet Take 50 mg by mouth daily.   Yes [provider]  ?predniSONE (DELTASONE) 10 MG tablet Take 10 mg by mouth daily. 06/25/21   [provider]  ?ferrous gluconate (FERGON) 324 MG tablet Take 1 tablet (324 mg total) by mouth 2 (two) times daily with a meal. ?Patient not taking: Reported on 03/30/2020 05/17/16 04/01/20  Debbe Odea, MD  ?potassium chloride SA (K-DUR,KLOR-CON) 20 MEQ tablet Take 1 tablet (20 mEq total) by mouth 2 (two) times daily. ?Patient not taking: Reported on 03/30/2020 10/10/17 04/01/20  Kathie Dike, MD  ?   ? ?Allergies    ?Promethazine   ? ?Review of Systems   ?Review of Systems  ?Psychiatric/Behavioral:  Positive for confusion.   ?All other systems reviewed and are negative. ? ?Physical Exam ?Updated Vital Signs ?BP 123/67   Pulse (!) 102   Temp 99.5 ?F (37.5 ?C)   Resp 17   Ht '5\' 7"'$  (1.702 m)   Wt 65.8 kg   SpO2 98%   BMI 22.72 kg/m?  ?Physical Exam ?Constitutional:   ?   Appearance: She is well-developed and normal weight. She is ill-appearing. She is not toxic-appearing or diaphoretic.  ?HENT:  ?   Head: Normocephalic and atraumatic.  ?   Right Ear: External ear normal.  ?   Left Ear: External ear normal.  ?   Nose: Nose normal.  ?   Mouth/Throat:  ?   Mouth: Mucous membranes are moist.  ?Eyes:  ?   General: No scleral icterus. ?   Conjunctiva/sclera: Conjunctivae normal.  ?Cardiovascular:  ?   Rate and Rhythm: Normal rate.  ?Pulmonary:  ?   Effort: Pulmonary effort is normal. No respiratory distress.  ?   Breath sounds: No wheezing or rhonchi.  ?Chest:  ?   Chest wall: No tenderness.  ?Abdominal:  ?    Palpations: Abdomen is soft.  ?   Tenderness: There is no abdominal tenderness.  ?Musculoskeletal:     ?   General: No tenderness or deformity. Normal range of motion.  ?   Cervical back: Neck supple. No tenderness.  ?Skin: ?   General: Skin is warm and dry.  ?Neurological:  ?   Mental Status: She is lethargic.  ?   GCS: GCS eye subscore is 3. GCS verbal subscore is 4. GCS motor subscore is 6.  ?   Cranial Nerves: Facial asymmetry (slight) present.  ?   Motor:  Motor function is intact. No weakness or abnormal muscle tone.  ?   Comments: Moving all extremities.  ? ? ?ED Results / Procedures / Treatments   ?Labs ?(all labs ordered are listed, but only abnormal results are displayed) ?Labs Reviewed  ?COMPREHENSIVE METABOLIC PANEL - Abnormal; Notable for the following components:  ?    Result Value  ? Glucose, Bld 133 (*)   ? Calcium 8.7 (*)   ? Albumin 2.4 (*)   ? Alkaline Phosphatase 229 (*)   ? All other components within normal limits  ?CBC WITH DIFFERENTIAL/PLATELET - Abnormal; Notable for the following components:  ? WBC 13.5 (*)   ? Hemoglobin 10.8 (*)   ? MCH 25.8 (*)   ? MCHC 29.8 (*)   ? RDW 20.4 (*)   ? Neutro Abs 12.0 (*)   ? Lymphs Abs 0.6 (*)   ? Abs Immature Granulocytes 0.24 (*)   ? All other components within normal limits  ?URINALYSIS, ROUTINE W REFLEX MICROSCOPIC - Abnormal; Notable for the following components:  ? APPearance HAZY (*)   ? Specific Gravity, Urine 1.003 (*)   ? Hgb urine dipstick MODERATE (*)   ? Protein, ur 30 (*)   ? Nitrite POSITIVE (*)   ? Leukocytes,Ua LARGE (*)   ? WBC, UA >50 (*)   ? Bacteria, UA RARE (*)   ? All other components within normal limits  ?BLOOD GAS, VENOUS - Abnormal; Notable for the following components:  ? pCO2, Ven 41 (*)   ? All other components within normal limits  ?CORTISOL-AM, BLOOD - Abnormal; Notable for the following components:  ? Cortisol - AM 5.0 (*)   ? All other components within normal limits  ?COMPREHENSIVE METABOLIC PANEL - Abnormal; Notable for  the following components:  ? Glucose, Bld 115 (*)   ? Calcium 8.2 (*)   ? Total Protein 5.4 (*)   ? Albumin 1.9 (*)   ? AST 14 (*)   ? Alkaline Phosphatase 172 (*)   ? All other components within normal limits  ?MAGNESI

## 2021-08-02 NOTE — ED Notes (Signed)
Update on pt status given to [place] ?

## 2021-08-02 NOTE — ED Triage Notes (Signed)
Pt to the ED with [facility] from [facility] for unresponsiveness and stroke-like symptoms. Pt is lethargic during triage and falls asleep easily. ?Pt has a DNR in place and is with her from the SNF. ?Pt became unresponsive after lunch at [facility] and would only arouse to a sternal rub. ? ?

## 2021-08-03 ENCOUNTER — Encounter (HOSPITAL_COMMUNITY): Payer: Self-pay | Admitting: Family Medicine

## 2021-08-03 ENCOUNTER — Other Ambulatory Visit: Payer: Self-pay

## 2021-08-03 DIAGNOSIS — F01B11 Vascular dementia, moderate, with agitation: Secondary | ICD-10-CM

## 2021-08-03 DIAGNOSIS — G9341 Metabolic encephalopathy: Secondary | ICD-10-CM | POA: Diagnosis not present

## 2021-08-03 DIAGNOSIS — E785 Hyperlipidemia, unspecified: Secondary | ICD-10-CM

## 2021-08-03 DIAGNOSIS — Z7189 Other specified counseling: Secondary | ICD-10-CM

## 2021-08-03 DIAGNOSIS — A419 Sepsis, unspecified organism: Secondary | ICD-10-CM

## 2021-08-03 DIAGNOSIS — E1169 Type 2 diabetes mellitus with other specified complication: Secondary | ICD-10-CM

## 2021-08-03 DIAGNOSIS — G9340 Encephalopathy, unspecified: Secondary | ICD-10-CM

## 2021-08-03 DIAGNOSIS — I480 Paroxysmal atrial fibrillation: Secondary | ICD-10-CM | POA: Diagnosis not present

## 2021-08-03 DIAGNOSIS — Z515 Encounter for palliative care: Secondary | ICD-10-CM | POA: Diagnosis not present

## 2021-08-03 DIAGNOSIS — R6520 Severe sepsis without septic shock: Secondary | ICD-10-CM

## 2021-08-03 DIAGNOSIS — T68XXXA Hypothermia, initial encounter: Secondary | ICD-10-CM

## 2021-08-03 LAB — CBC WITH DIFFERENTIAL/PLATELET
Abs Immature Granulocytes: 0.09 10*3/uL — ABNORMAL HIGH (ref 0.00–0.07)
Basophils Absolute: 0.1 10*3/uL (ref 0.0–0.1)
Basophils Relative: 1 %
Eosinophils Absolute: 0 10*3/uL (ref 0.0–0.5)
Eosinophils Relative: 1 %
HCT: 29.8 % — ABNORMAL LOW (ref 36.0–46.0)
Hemoglobin: 9 g/dL — ABNORMAL LOW (ref 12.0–15.0)
Immature Granulocytes: 1 %
Lymphocytes Relative: 12 %
Lymphs Abs: 0.9 10*3/uL (ref 0.7–4.0)
MCH: 26.1 pg (ref 26.0–34.0)
MCHC: 30.2 g/dL (ref 30.0–36.0)
MCV: 86.4 fL (ref 80.0–100.0)
Monocytes Absolute: 0.5 10*3/uL (ref 0.1–1.0)
Monocytes Relative: 7 %
Neutro Abs: 5.9 10*3/uL (ref 1.7–7.7)
Neutrophils Relative %: 78 %
Platelets: 344 10*3/uL (ref 150–400)
RBC: 3.45 MIL/uL — ABNORMAL LOW (ref 3.87–5.11)
RDW: 20.5 % — ABNORMAL HIGH (ref 11.5–15.5)
WBC: 7.4 10*3/uL (ref 4.0–10.5)
nRBC: 0.3 % — ABNORMAL HIGH (ref 0.0–0.2)

## 2021-08-03 LAB — COMPREHENSIVE METABOLIC PANEL
ALT: 13 U/L (ref 0–44)
AST: 14 U/L — ABNORMAL LOW (ref 15–41)
Albumin: 1.9 g/dL — ABNORMAL LOW (ref 3.5–5.0)
Alkaline Phosphatase: 172 U/L — ABNORMAL HIGH (ref 38–126)
Anion gap: 8 (ref 5–15)
BUN: 15 mg/dL (ref 8–23)
CO2: 24 mmol/L (ref 22–32)
Calcium: 8.2 mg/dL — ABNORMAL LOW (ref 8.9–10.3)
Chloride: 110 mmol/L (ref 98–111)
Creatinine, Ser: 0.51 mg/dL (ref 0.44–1.00)
GFR, Estimated: >60 mL/min (ref >60)
Glucose, Bld: 115 mg/dL — ABNORMAL HIGH (ref 70–99)
Potassium: 3.7 mmol/L (ref 3.5–5.1)
Sodium: 142 mmol/L (ref 135–145)
Total Bilirubin: 0.6 mg/dL (ref 0.3–1.2)
Total Protein: 5.4 g/dL — ABNORMAL LOW (ref 6.5–8.1)

## 2021-08-03 LAB — GLUCOSE, CAPILLARY
Glucose-Capillary: 80 mg/dL (ref 70–99)
Glucose-Capillary: 55 mg/dL — ABNORMAL LOW (ref 70–99)
Glucose-Capillary: 77 mg/dL (ref 70–99)
Glucose-Capillary: 127 mg/dL — ABNORMAL HIGH (ref 70–99)
Glucose-Capillary: 120 mg/dL — ABNORMAL HIGH (ref 70–99)
Glucose-Capillary: 134 mg/dL — ABNORMAL HIGH (ref 70–99)

## 2021-08-03 LAB — PROTIME-INR
INR: 1.2 (ref 0.8–1.2)
Prothrombin Time: 15 seconds (ref 11.4–15.2)

## 2021-08-03 LAB — LACTIC ACID, PLASMA
Lactic Acid, Venous: 1.5 mmol/L (ref 0.5–1.9)
Lactic Acid, Venous: 1.6 mmol/L (ref 0.5–1.9)

## 2021-08-03 LAB — TSH: TSH: 1.694 u[IU]/mL (ref 0.350–4.500)

## 2021-08-03 LAB — CBG MONITORING, ED
Glucose-Capillary: 45 mg/dL — ABNORMAL LOW (ref 70–99)
Glucose-Capillary: 113 mg/dL — ABNORMAL HIGH (ref 70–99)

## 2021-08-03 LAB — MAGNESIUM: Magnesium: 1.5 mg/dL — ABNORMAL LOW (ref 1.7–2.4)

## 2021-08-03 LAB — CORTISOL-AM, BLOOD: Cortisol - AM: 5 ug/dL — ABNORMAL LOW (ref 6.7–22.6)

## 2021-08-03 LAB — PROCALCITONIN: Procalcitonin: 1.08 ng/mL

## 2021-08-03 MED ORDER — FLORANEX PO PACK
1.0000 g | PACK | Freq: Three times a day (TID) | ORAL | Status: DC
  Administered 2021-08-03 – 2021-08-05 (×6): 1 g via ORAL
  Filled 2021-08-03 (×10): qty 1

## 2021-08-03 MED ORDER — HEPARIN SODIUM (PORCINE) 5000 UNIT/ML IJ SOLN
5000.0000 [IU] | Freq: Three times a day (TID) | INTRAMUSCULAR | Status: DC
  Administered 2021-08-03 – 2021-08-05 (×7): 5000 [IU] via SUBCUTANEOUS
  Filled 2021-08-03 (×7): qty 1

## 2021-08-03 MED ORDER — METOPROLOL SUCCINATE ER 50 MG PO TB24
50.0000 mg | ORAL_TABLET | Freq: Every day | ORAL | Status: DC
  Administered 2021-08-03 – 2021-08-05 (×3): 50 mg via ORAL
  Filled 2021-08-03 (×3): qty 1

## 2021-08-03 MED ORDER — ALBUTEROL SULFATE (2.5 MG/3ML) 0.083% IN NEBU: 3.0000 mL | INHALATION_SOLUTION | RESPIRATORY_TRACT | Status: DC | PRN

## 2021-08-03 MED ORDER — PANTOPRAZOLE SODIUM 20 MG PO TBEC
20.0000 mg | DELAYED_RELEASE_TABLET | Freq: Every day | ORAL | Status: DC
  Filled 2021-08-03 (×2): qty 1

## 2021-08-03 MED ORDER — METOPROLOL TARTRATE 25 MG PO TABS
25.0000 mg | ORAL_TABLET | Freq: Two times a day (BID) | ORAL | Status: DC
  Administered 2021-08-03: 25 mg via ORAL
  Filled 2021-08-03: qty 1

## 2021-08-03 MED ORDER — DEXTROSE 50 % IV SOLN
1.0000 | Freq: Once | INTRAVENOUS | Status: AC
  Administered 2021-08-03: 50 mL via INTRAVENOUS
  Filled 2021-08-03: qty 50

## 2021-08-03 MED ORDER — OXYCODONE HCL 5 MG PO TABS
5.0000 mg | ORAL_TABLET | Freq: Three times a day (TID) | ORAL | Status: DC | PRN
Start: 2021-08-03 — End: 2021-08-05
  Administered 2021-08-03 – 2021-08-05 (×2): 5 mg via ORAL
  Filled 2021-08-03 (×2): qty 1

## 2021-08-03 MED ORDER — PANTOPRAZOLE SODIUM 40 MG PO TBEC
40.0000 mg | DELAYED_RELEASE_TABLET | Freq: Every day | ORAL | Status: DC
Start: 2021-08-03 — End: 2021-08-03

## 2021-08-03 MED ORDER — ONDANSETRON HCL 4 MG PO TABS: 4.0000 mg | ORAL_TABLET | Freq: Four times a day (QID) | ORAL | Status: DC | PRN

## 2021-08-03 MED ORDER — ACETAMINOPHEN 325 MG PO TABS: 650.0000 mg | ORAL_TABLET | Freq: Four times a day (QID) | ORAL | Status: DC | PRN

## 2021-08-03 MED ORDER — DILTIAZEM HCL ER COATED BEADS 180 MG PO CP24
360.0000 mg | ORAL_CAPSULE | Freq: Every day | ORAL | Status: DC
  Administered 2021-08-03 – 2021-08-05 (×3): 360 mg via ORAL
  Filled 2021-08-03 (×3): qty 2

## 2021-08-03 MED ORDER — ONDANSETRON HCL 4 MG/2ML IJ SOLN
4.0000 mg | Freq: Four times a day (QID) | INTRAMUSCULAR | Status: DC | PRN
Start: 2021-08-03 — End: 2021-08-05

## 2021-08-03 MED ORDER — INSULIN ASPART 100 UNIT/ML IJ SOLN: 0.0000 [IU] | Freq: Every day | INTRAMUSCULAR | Status: DC

## 2021-08-03 MED ORDER — CHLORHEXIDINE GLUCONATE CLOTH 2 % EX PADS
6.0000 | MEDICATED_PAD | Freq: Every day | CUTANEOUS | Status: DC
  Administered 2021-08-03 – 2021-08-05 (×3): 6 via TOPICAL

## 2021-08-03 MED ORDER — ACETAMINOPHEN 650 MG RE SUPP
650.0000 mg | Freq: Four times a day (QID) | RECTAL | Status: DC | PRN
Start: 2021-08-03 — End: 2021-08-05

## 2021-08-03 MED ORDER — MOMETASONE FURO-FORMOTEROL FUM 200-5 MCG/ACT IN AERO
2.0000 | INHALATION_SPRAY | Freq: Two times a day (BID) | RESPIRATORY_TRACT | Status: DC
Start: 2021-08-03 — End: 2021-08-05
  Administered 2021-08-03 – 2021-08-05 (×5): 2 via RESPIRATORY_TRACT
  Filled 2021-08-03: qty 8.8

## 2021-08-03 MED ORDER — DEXTROSE IN LACTATED RINGERS 5 % IV SOLN: INTRAVENOUS | Status: DC

## 2021-08-03 MED ORDER — BUPROPION HCL ER (SR) 100 MG PO TB12
100.0000 mg | ORAL_TABLET | Freq: Two times a day (BID) | ORAL | Status: DC
  Administered 2021-08-03 – 2021-08-05 (×5): 100 mg via ORAL
  Filled 2021-08-03 (×8): qty 1

## 2021-08-03 MED ORDER — PANTOPRAZOLE SODIUM 20 MG PO TBEC
20.0000 mg | DELAYED_RELEASE_TABLET | Freq: Every day | ORAL | Status: DC
  Administered 2021-08-03 – 2021-08-05 (×3): 20 mg via ORAL
  Filled 2021-08-03 (×4): qty 1

## 2021-08-03 MED ORDER — LORAZEPAM 2 MG/ML IJ SOLN
1.0000 mg | INTRAMUSCULAR | Status: DC | PRN
Start: 2021-08-03 — End: 2021-08-05

## 2021-08-03 MED ORDER — PREDNISONE 10 MG PO TABS
5.0000 mg | ORAL_TABLET | Freq: Every day | ORAL | Status: DC
  Administered 2021-08-03 – 2021-08-05 (×3): 5 mg via ORAL
  Filled 2021-08-03 (×3): qty 1

## 2021-08-03 MED ORDER — INSULIN ASPART 100 UNIT/ML IJ SOLN
0.0000 [IU] | Freq: Three times a day (TID) | INTRAMUSCULAR | Status: DC
  Administered 2021-08-04: 1 [IU] via SUBCUTANEOUS

## 2021-08-03 NOTE — ED Notes (Signed)
CBG did not cross over in chart. This RN checked CBG and it was 113. ?

## 2021-08-03 NOTE — NC FL2 (Signed)
? MEDICAID FL2 LEVEL OF CARE SCREENING TOOL  ?  ? ?IDENTIFICATION  ?Patient Name: ?[name] Birthdate: [birth year] Sex: female Admission Date (Current Location): ?08/02/2021  ?[place] and [place] Number: ? [place] and Address:  ?[facility] [address] [place] ?     Provider Number: ?[id number]  ?Attending Physician Name and Address:  ?Deatra James, MD ? Relative Name and Phone Number:  ?  ?   ?Current Level of Care: ?Hospital Recommended Level of Care: ?[place] Prior Approval Number: ?  ? ?Date Approved/Denied: ?  PASRR Number: ?  ? ?Discharge Plan: ?SNF ?  ? ?Current Diagnoses: ?Patient Active Problem List  ? Diagnosis Date Noted  ? Hypothermia 08/03/2021  ? Sepsis ([place])   ? Dementia with agitation ([place])   ? Protein-calorie malnutrition, severe 06/19/2021  ? Encephalopathy   ? Contusion of scalp   ? Fever   ? Delirium   ? Agitation   ? Bacteremia due to Proteus species   ? Cellulitis of left lower extremity   ? Hypokalemia 05/21/2021  ? Acute metabolic encephalopathy 22/97/9892  ? Pressure injury of skin 05/21/2021  ? Sepsis due to undetermined organism ([place]) 03/30/2020  ? Hypomagnesemia 03/30/2020  ? Hypophosphatemia 03/30/2020  ? Hyperglycemia   ? Iliopsoas abscess on left ([place]) 10/04/2017  ? HCAP (healthcare-associated pneumonia) 10/04/2017  ? Acute lower UTI 10/04/2017  ? Hallucinations 07/25/2017  ? Abnormal LFTs 07/24/2017  ? Acute on chronic diastolic CHF (congestive heart failure) ([place])   ? Elevated LFTs   ? AKI (acute kidney injury) ([place])   ? Elevated troponin   ? Stroke, acute, embolic ([place]) 11/94/1740  ? Mitral regurgitation 06/17/2017  ? Thyroid nodule 06/15/2017  ? Atrial fibrillation with RVR ([place]) 06/14/2017  ? CVA (cerebral vascular accident) ([place]) 06/14/2017  ? Peripheral vascular disease ([place])   ? Hiatal hernia   ? Chronic diastolic (congestive) heart failure (H[facility])   ? Carpal tunnel syndrome   ? A-fib ([place])   ? Anemia    ? Actinic keratosis   ? Acute CHF (congestive heart failure) ([place]) 05/20/2016  ? History of GI bleed 03/29/2016  ? Chronic anticoagulation 03/29/2016  ? Symptomatic anemia 03/19/2016  ? Spinal stenosis of lumbar region 02/10/2016  ? Varicose veins of left leg with edema 09/15/2015  ? Iron deficiency anemia due to chronic blood loss 07/17/2015  ? Anemia of chronic renal failure, stage 3 (moderate) ([place]) 07/17/2015  ? Anticoagulant causing adverse effect in therapeutic use 07/17/2015  ? Symptomatic bradycardia 02/21/2015  ? Bradycardia   ? Abdominal wall mass of right lower quadrant 07/08/2013  ? OA (osteoarthritis) of hip 04/03/2013  ? Osteoarthritis of hip 01/18/2013  ? Night sweats 09/06/2012  ? Chest pain   ? Anxiety and depression   ? Gastroesophageal reflux disease   ? Hepatitis   ? Jaundice   ? Hypertension 07/01/2010  ? Type 2 diabetes mellitus with hyperlipidemia ([place]) 07/01/2010  ? Rheumatoid arthritis ([place]) 07/01/2010  ? Sinus tachycardia 07/01/2010  ? Chest pain, atypical 07/01/2010  ? ? ?Orientation RESPIRATION BLADDER Height & Weight   ?  ?Self, Place ? Normal Incontinent Weight: 145 lb 1 oz (65.8 kg) ?Height:  '5\' 7"'$  (170.2 cm)  ?BEHAVIORAL SYMPTOMS/MOOD NEUROLOGICAL BOWEL NUTRITION STATUS  ?    Incontinent Diet (see dc summary)  ?AMBULATORY STATUS COMMUNICATION OF NEEDS Skin   ?Total Care Verbally PU Stage and Appropriate Care (medial cocyx) ?  ?PU Stage  2 Dressing: Daily ?  ?    ?     ?     ? ? ?Personal Care Assistance Level of Assistance  ?  Bathing Assistance: Maximum assistance ?Feeding assistance: Independent ?Dressing Assistance: Maximum assistance ?   ? ?Functional Limitations Info  ?  Sight Info: Impaired ?Hearing Info: Impaired ?Speech Info: Adequate  ? ? ?SPECIAL CARE FACTORS FREQUENCY  ?    ?  ?  ?  ?  ?  ?  ?   ? ? ?Contractures Contractures Info: Not present  ? ? ?Additional Factors Info  ?  Code Status Info: DNR ?Allergies Info: Promethazine ?Psychotropic Info: Wellbutrin ?  ?  ?    ? ?Current Medications (08/03/2021):  This is the current hospital active medication list ?Current Facility-Administered Medications  ?Medication Dose Route Frequency Provider Last Rate Last Admin  ? acetaminophen (TYLENOL) tablet 650 mg  650 mg Oral Q6H PRN Zierle-Ghosh, Asia B, DO      ? Or  ? acetaminophen (TYLENOL) suppository 650 mg  650 mg Rectal Q6H PRN Zierle-Ghosh, Asia B, DO      ? albuterol (PROVENTIL) (2.5 MG/3ML) 0.083% nebulizer solution 3 mL  3 mL Inhalation Q4H PRN Zierle-Ghosh, Asia B, DO      ? buPROPion ER (WELLBUTRIN SR) 12 hr tablet 100 mg  100 mg Oral BID Zierle-Ghosh, Asia B, DO   100 mg at 08/03/21 0830  ? Chlorhexidine Gluconate Cloth 2 % PADS 6 each  6 each Topical Q0600 Zierle-Ghosh, Asia B, DO   6 each at 08/03/21 0406  ? dextrose 5 % in lactated ringers infusion   Intravenous Continuous Zierle-Ghosh, Asia B, DO 75 mL/hr at 08/03/21 0655 New Bag at 08/03/21 0655  ? diltiazem (CARDIZEM CD) 24 hr capsule 360 mg  360 mg Oral Daily Zierle-Ghosh, Asia B, DO   360 mg at 08/03/21 0830  ? heparin injection 5,000 Units  5,000 Units Subcutaneous Q8H Zierle-Ghosh, Asia B, DO   5,000 Units at 08/03/21 0617  ? insulin aspart (novoLOG) injection 0-5 Units  0-5 Units Subcutaneous QHS Zierle-Ghosh, Asia B, DO      ? insulin aspart (novoLOG) injection 0-9 Units  0-9 Units Subcutaneous TID WC Zierle-Ghosh, Asia B, DO      ? lactobacillus (FLORANEX/LACTINEX) granules 1 g  1 g Oral TID WC Shahmehdi, Seyed A, MD      ? linezolid (ZYVOX) IVPB 600 mg  600 mg Intravenous Q12H Zierle-Ghosh, Asia B, DO 300 mL/hr at 08/03/21 0905 600 mg at 08/03/21 0905  ? LORazepam (ATIVAN) injection 1 mg  1 mg Intravenous Q4H PRN Zierle-Ghosh, Asia B, DO      ? metoprolol tartrate (LOPRESSOR) tablet 25 mg  25 mg Oral BID Zierle-Ghosh, Asia B, DO   25 mg at 08/03/21 0830  ? mometasone-formoterol (DULERA) 200-5 MCG/ACT inhaler 2 puff  2 puff Inhalation BID Zierle-Ghosh, Asia B, DO   2 puff at 08/03/21 0712  ? ondansetron (ZOFRAN)  tablet 4 mg  4 mg Oral Q6H PRN Zierle-Ghosh, Asia B, DO      ? Or  ? ondansetron (ZOFRAN) injection 4 mg  4 mg Intravenous Q6H PRN Zierle-Ghosh, Asia B, DO      ? oxyCODONE (Oxy IR/ROXICODONE) immediate release tablet 5 mg  5 mg Oral Q8H PRN Zierle-Ghosh, Asia B, DO   5 mg at 08/03/21 0950  ? pantoprazole (PROTONIX) EC tablet 20 mg  20 mg Oral Daily Shahmehdi, Seyed A, MD   20 mg at  08/03/21 0950  ? predniSONE (DELTASONE) tablet 5 mg  5 mg Oral Daily Zierle-Ghosh, Asia B, DO   5 mg at 08/03/21 0830  ? ? ? ?Discharge Medications: ?Please see discharge summary for a list of discharge medications. ? ?Relevant Imaging Results: ? ?Relevant Lab Results: ? ? ?Additional Information ?  ? ?Shade Flood, LCSW ? ? ? ? ?

## 2021-08-03 NOTE — Assessment & Plan Note (Addendum)
-  Improved sepsis physiology with exception of tachycardia (chronic) ?- Met sepsis criteria on admission ?Temperature 95.5, heart rate 94, respiratory 21, white blood cell count 13.5, with acute metabolic encephalopathy ?-Blood cultures >>> ?-Urine is indicative of a UTI, urine culture >>>>  ?-Last micro grew vancomycin-resistant Enterococcus, continue linezolid ?- status post IV fluid resuscitation, hemodynamically stable ?-We will continue to monitor ?

## 2021-08-03 NOTE — Hospital Course (Signed)
?  [name] is a 76 y.o. female with medical history significant of atrial fibrillation, history of stroke, hyperlipidemia, diabetes mellitus type 2, peripheral vascular disease, hypertension, anxiety, and more presents to the ED with a chief complaint of altered mental status.   ?Unfortunately patient is somnolent and nonverbal.  She is not providing any history at this time.  It is reported that she was lethargic, and just not acting herself at her nursing home.   ?Patient presented to the ED hypothermic, tachycardic, tachypneic.   ?She has a history of UTI with VRE so she was started on linezolid and cefepime.   ?In the ER she had negative respiratory panel. ?Blood cultures and urine cultures obtained.   ?CT head showed no acute findings.  Chest x-ray showed bibasilar atelectasis or edema without definitive infection.   ?EKG showed a heart rate of 78, atrial fibrillation, QTc 540.  Patient was given 1.5 L bolus and started on fluids LR 150 mL/h.  She is also started on Flagyl.   ?Work-up revealed a UA that was indicative of UTI.   ? ?Admission was requested for sepsis. ?  ?Patient does have DNR form at bedside. ? ?ED ?Temp 86.2-98, HR 64-94, RR 12-21, BP 100/62-144/91, 100% on 2 L nasal cannula ?Leukocytosis at 13.5, hemoglobin 10.8, platelets 385 ?Chemistry is unremarkable -although there was a reported hypoglycemic event at home ?Albumin 2.4 ?Alk phos 229 ?Negative respiratory panel ?Blood cultures pending ?Urine culture pending ?UA indicative of UTI ?Chest x-ray shows bibasilar atelectasis or edema ?UDS is negative ?Patient started on Zyvox, cefepime, Flagyl ?Narcan 0.2 given ?1.5 L bolus followed by LR 150 MLS per hour ?Admission requested for sepsis ?

## 2021-08-03 NOTE — Assessment & Plan Note (Addendum)
-   Temperature as low as 86.2 ?-Temperature normalized status post warming blanket at time of admission ?-Secondary to sepsis most likely ?-TSH 1.694 normal  ?-Continue to monitor  ?

## 2021-08-03 NOTE — Progress Notes (Signed)
Hypoglycemic Event ? ?CBG: 55 ? ?Treatment: 4 oz juice/soda ? ?Symptoms: None ? ?Follow-up CBG: [id number] CBG Result:77 ? ?Possible Reasons for Event: Unknown ? ?Comments/MD notified: [place] DO ? ? ? ?[name] ? ? ?

## 2021-08-03 NOTE — TOC Initial Note (Signed)
Transition of Care (TOC) - Initial/Assessment Note  ? ? ?Patient Details  ?Name: [name] ?MRN: [id number] ?Date of Birth: Jan 13, 1946 ? ?Transition of Care (TOC) CM/SW Contact:    ?Shade Flood, LCSW ?Phone Number: ?08/03/2021, 11:08 AM ? ?Clinical Narrative:                 ? ?Pt admitted from [facility]. Pt known to TOC from previous admissions. Per MD, anticipating dc tomorrow. Attempted to update pt's son though no answer and no voicemail option. Updated Melissa at [facility].  ? ?Will follow up in AM. ? ?Expected Discharge Plan: [place] ?Barriers to Discharge: Continued Medical Work up ? ? ?Patient Goals and CMS Choice ?Patient states their goals for this hospitalization and ongoing recovery are:: get better ?  ?Choice offered to / list presented to : Adult Children ? ?Expected Discharge Plan and Services ?Expected Discharge Plan: [place] ?In-house Referral: Clinical Social Work ?  ?Post Acute Care Choice: Resumption of Svcs/PTA Provider ?Living arrangements for the past 2 months: [place] ?                ?  ?  ?  ?  ?  ?  ?  ?  ?  ?  ? ?Prior Living Arrangements/Services ?Living arrangements for the past 2 months: [place] ?Lives with:: Facility Resident ?Patient language and need for interpreter reviewed:: Yes ?Do you feel safe going back to the place where you live?: Yes      ?Need for Family Participation in Patient Care: No (Comment) ?Care giver support system in place?: Yes (comment) ?  ?Criminal Activity/Legal Involvement Pertinent to Current Situation/Hospitalization: No - Comment as needed ? ?Activities of Daily Living ?Home Assistive Devices/Equipment: None ?ADL Screening (condition at time of admission) ?Patient's cognitive ability adequate to safely complete daily activities?: No ?Is the patient deaf or have difficulty hearing?: Yes ?Does the patient have difficulty seeing, even when wearing glasses/contacts?: No ?Does the  patient have difficulty concentrating, remembering, or making decisions?: Yes ?Patient able to express need for assistance with ADLs?: No ?Does the patient have difficulty dressing or bathing?: Yes ?Independently performs ADLs?: No ?Communication: Needs assistance ?Is this a change from baseline?: Pre-admission baseline ?Dressing (OT): Needs assistance ?Is this a change from baseline?: Pre-admission baseline ?Grooming: Needs assistance ?Is this a change from baseline?: Pre-admission baseline ?Feeding: Needs assistance ?Is this a change from baseline?: Pre-admission baseline ?Bathing: Needs assistance ?Is this a change from baseline?: Pre-admission baseline ?Toileting: Needs assistance ?Is this a change from baseline?: Pre-admission baseline ?In/Out Bed: Needs assistance ?Is this a change from baseline?: Pre-admission baseline ?Walks in Home: Dependent ?Is this a change from baseline?: Pre-admission baseline ?Does the patient have difficulty walking or climbing stairs?: Yes ?Weakness of Legs: Both ?Weakness of Arms/Hands: Both ? ?Permission Sought/Granted ?  ?  ?   ?   ?   ?   ? ?Emotional Assessment ?  ?  ?  ?Orientation: : Oriented to Self ?Alcohol / Substance Use: Not Applicable ?Psych Involvement: No (comment) ? ?Admission diagnosis:  Sepsis ([place]) [A41.9] ?Patient Active Problem List  ? Diagnosis Date Noted  ? Hypothermia 08/03/2021  ? Sepsis ([place])   ? Dementia with agitation ([place])   ? Protein-calorie malnutrition, severe 06/19/2021  ? Encephalopathy   ? Contusion of scalp   ? Fever   ? Delirium   ? Agitation   ? Bacteremia due to Proteus species   ?  Cellulitis of left lower extremity   ? Hypokalemia 05/21/2021  ? Acute metabolic encephalopathy 71/69/6789  ? Pressure injury of skin 05/21/2021  ? Sepsis due to undetermined organism ([place]) 03/30/2020  ? Hypomagnesemia 03/30/2020  ? Hypophosphatemia 03/30/2020  ? Hyperglycemia   ? Iliopsoas abscess on left ([place]) 10/04/2017  ? HCAP (healthcare-associated pneumonia)  10/04/2017  ? Acute lower UTI 10/04/2017  ? Hallucinations 07/25/2017  ? Abnormal LFTs 07/24/2017  ? Acute on chronic diastolic CHF (congestive heart failure) ([place])   ? Elevated LFTs   ? AKI (acute kidney injury) ([place])   ? Elevated troponin   ? Stroke, acute, embolic ([place]) 38/01/1750  ? Mitral regurgitation 06/17/2017  ? Thyroid nodule 06/15/2017  ? Atrial fibrillation with RVR ([place]) 06/14/2017  ? CVA (cerebral vascular accident) ([place]) 06/14/2017  ? Peripheral vascular disease ([place])   ? Hiatal hernia   ? Chronic diastolic (congestive) heart failure (HCC)   ? Carpal tunnel syndrome   ? A-fib ([place])   ? Anemia   ? Actinic keratosis   ? Acute CHF (congestive heart failure) ([place]) 05/20/2016  ? History of GI bleed 03/29/2016  ? Chronic anticoagulation 03/29/2016  ? Symptomatic anemia 03/19/2016  ? Spinal stenosis of lumbar region 02/10/2016  ? Varicose veins of left leg with edema 09/15/2015  ? Iron deficiency anemia due to chronic blood loss 07/17/2015  ? Anemia of chronic renal failure, stage 3 (moderate) ([place]) 07/17/2015  ? Anticoagulant causing adverse effect in therapeutic use 07/17/2015  ? Symptomatic bradycardia 02/21/2015  ? Bradycardia   ? Abdominal wall mass of right lower quadrant 07/08/2013  ? OA (osteoarthritis) of hip 04/03/2013  ? Osteoarthritis of hip 01/18/2013  ? Night sweats 09/06/2012  ? Chest pain   ? Anxiety and depression   ? Gastroesophageal reflux disease   ? Hepatitis   ? Jaundice   ? Hypertension 07/01/2010  ? Type 2 diabetes mellitus with hyperlipidemia ([place]) 07/01/2010  ? Rheumatoid arthritis ([place]) 07/01/2010  ? Sinus tachycardia 07/01/2010  ? Chest pain, atypical 07/01/2010  ? ?PCP:  Aletha Halim., PA-C ?Pharmacy:   ?[facility] [place] ?[address] ?[place] [address] ?Phone: [phone] Fax: [phone] ? ? ? ? ?Social Determinants of Health (SDOH) Interventions ?  ? ?Readmission Risk Interventions ? ?  08/03/2021  ? 11:07 AM  07/26/2021  ?  2:12 PM 06/29/2021  ? 11:03 AM  ?Readmission Risk Prevention Plan  ?Transportation Screening Complete Complete Complete  ?Medication Review Press photographer) Complete  Complete  ?[place] or Home Care Consult Complete Complete Complete  ?SW Recovery Care/Counseling Consult Complete Complete Complete  ?Palliative Care Screening Not Applicable  Complete  ?Skilled Nursing Facility Complete Complete Complete  ? ? ? ?

## 2021-08-03 NOTE — Assessment & Plan Note (Addendum)
-   With known episodes of agitation in the hospital ?-As needed Ativan ?-Plan to discharge back to SNF [facility] when patient is medically cleared in next 24-48 hrs  ?

## 2021-08-03 NOTE — Assessment & Plan Note (Addendum)
-  Tachyarrhythmia ?- Continue metoprolol and diltiazem ?We will continue Cardizem CD 360 mg p.o. daily, change metoprolol to Toprol-XL 50 mg daily ?-We will discontinue telemetry telemetry monitor..  Due to excessive alarming --as patient's heart rate fluctuates ?-Currently not on chronic anticoagulation therapy likely due to severe generalized weakness, falls, waxing and waning mental status ?-Patient remains high risk for stroke ?

## 2021-08-03 NOTE — ED Notes (Signed)
Pt core temp WNL, pt stated that she was hot and warming blanket taken off of pt. ?

## 2021-08-03 NOTE — Progress Notes (Signed)
?PROGRESS NOTE ? ? ? ?Patient: [name]                            PCP: Aletha Halim., PA-C                    ?DOB: 19-Sep-1945            DOA: 08/02/2021 ?ESP:[id number]             DOS: 08/03/2021, 12:13 PM ? ? LOS: 1 day  ? ?Date of Service: The patient was seen and examined on 08/03/2021 ? ?Subjective:  ? ?The patient was seen and examined this morning. ?Stable at this time. ?Awake alert, unpleasant this morning.  ?Complaining of lower back, buttocks pain, ?Otherwise no issues overnight . ? ?Brief Narrative:  ? ? ?Adaijah L Nolasco is a 76 y.o. female with medical history significant of atrial fibrillation, history of stroke, hyperlipidemia, diabetes mellitus type 2, peripheral vascular disease, hypertension, anxiety, and more presents to the ED with a chief complaint of altered mental status.   ?Unfortunately patient is somnolent and nonverbal.  She is not providing any history at this time.  It is reported that she was lethargic, and just not acting herself at her nursing home.   ?Patient presented to the ED hypothermic, tachycardic, tachypneic.   ?She has a history of UTI with VRE so she was started on linezolid and cefepime.   ?In the ER she had negative respiratory panel. ?Blood cultures and urine cultures obtained.   ?CT head showed no acute findings.  Chest x-ray showed bibasilar atelectasis or edema without definitive infection.   ?EKG showed a heart rate of 78, atrial fibrillation, QTc 540.  Patient was given 1.5 L bolus and started on fluids LR 150 mL/h.  She is also started on Flagyl.   ?Work-up revealed a UA that was indicative of UTI.   ? ?Admission was requested for sepsis. ?  ?Patient does have DNR form at bedside. ? ?ED ?Temp 86.2-98, HR 64-94, RR 12-21, BP 100/62-144/91, 100% on 2 L nasal cannula ?Leukocytosis at 13.5, hemoglobin 10.8, platelets 385 ?Chemistry is unremarkable -although there was a reported hypoglycemic event at home ?Albumin 2.4 ?Alk phos 229 ?Negative respiratory panel ?Blood  cultures pending ?Urine culture pending ?UA indicative of UTI ?Chest x-ray shows bibasilar atelectasis or edema ?UDS is negative ?Patient started on Zyvox, cefepime, Flagyl ?Narcan 0.2 given ?1.5 L bolus followed by LR 150 MLS per hour ?Admission requested for sepsis ? ? ? ?Assessment & Plan:  ? ?Active Problems: ?  Sepsis ([place]) ?  Hypertension ?  Type 2 diabetes mellitus with hyperlipidemia ([place]) ?  A-fib ([place]) ?  Acute metabolic encephalopathy ?  Dementia with agitation ([place]) ?  Hypothermia ? ? ? ? ?Assessment and Plan: ?Sepsis ([place]) ?-Improved sepsis physiology with exception of tachycardia (chronic) ?- Met sepsis criteria on admission ?Temperature 95.5, heart rate 94, respiratory 21, white blood cell count 13.5, with acute metabolic encephalopathy ?-Blood cultures >>> ?-Urine is indicative of a UTI, urine culture >>>>  ?-Last micro grew vancomycin-resistant Enterococcus, continue linezolid ?- status post IV fluid resuscitation, hemodynamically stable ?-We will continue to monitor ? ?Hypothermia ?- Temperature as low as 86.2 ?-Temperature normalized status post warming blanket at time of admission ?-Secondary to sepsis most likely ?-TSH 1.694 normal  ?-Continue to monitor  ? ?Dementia with agitation ([place]) ?- With known episodes of agitation in the hospital ?-As  needed Ativan ?-Plan to discharge back to SNF [facility] when patient is medically cleared in next 24-48 hrs  ? ?Acute metabolic encephalopathy ?Most likely due to sepsis and hypothermia that is related to sepsis ?-Shows no acute findings, evidence of old infarct ?-UDS negative ?-Narcan 0.2 mg offered no result ?-See plan for sepsis and plan for hypothermia ?-Continue to monitor ? ?A-fib ([place]) ?-Tachyarrhythmia ?- Continue metoprolol and diltiazem ?We will continue Cardizem CD 360 mg p.o. daily, change metoprolol to Toprol-XL 50 mg daily ?-We will discontinue telemetry telemetry monitor..  Due to excessive alarming --as patient's heart rate  fluctuates ?-Currently not on chronic anticoagulation therapy likely due to severe generalized weakness, falls, waxing and waning mental status ?-Patient remains high risk for stroke ? ?Type 2 diabetes mellitus with hyperlipidemia ([place]) ?- ?  Report of hypoglycemia ?-12 units basal insulin at baseline ?-Holding basal insulin at this time ?-Continue with CBG q. AC/HS with SSI coverage ?-Regular diet given hypoglycemia earlier ?-Continue to monitor ? ?Hypertension ?- Continue metoprolol and diltiazem ?-Continue to monitor ? ? ? ?Skin Assessment: ?I have examined the patient's skin and I agree with the wound assessment as performed by wound care team ?As outlined belowe: ?Pressure Injury 08/03/21 Coccyx Medial Stage 2 -  Partial thickness loss of dermis presenting as a shallow open injury with a red, pink wound bed without slough. (Active)  ?08/03/21 0800  ?Location: Coccyx  ?Location Orientation: Medial  ?Staging: Stage 2 -  Partial thickness loss of dermis presenting as a shallow open injury with a red, pink wound bed without slough.  ?Wound Description (Comments):   ?Present on Admission: Yes  ?Dressing Type Foam - Lift dressing to assess site every shift 08/03/21 0800  ? ? ? ?---------------------------------------------------------------------------------------------------------------------------------------------------- ?Cultures; ?Blood Cultures x 2 >> NGT ?Urine Culture  >>> NGT  ?----------------------------------------------------------------------------------------------------------------- ? ?DVT prophylaxis:  ?heparin injection 5,000 Units Start: 08/03/21 0600 ?SCDs Start: 08/03/21 0158 ? ? ?Code Status:   Code Status: DNR ? ?Family Communication: No family member present at bedside- attempt will be made to update daily ?The above findings and plan of care has been discussed with patient (and family)  in detail,  ?they expressed understanding and agreement of above. ?-Advance care planning has been  discussed.  ? ?Disposition: Back to SNF [facility] in next 24 hours,  ?if cultures remain negative modifying antibiotics to p.o. ? ? ?admission status:   ?Status is: Inpatient ?Remains inpatient appropriate because: Requiring IV fluids, IV antibiotics treating sepsis ? ? ? ? ? ?Procedures:  ? ?No admission procedures for hospital encounter. ? ? ?Antimicrobials:  ?Anti-infectives (From admission, onward)  ? ? Start     Dose/Rate Route Frequency Ordered Stop  ? 08/03/21 0300  ceFEPIme (MAXIPIME) 2 g in sodium chloride 0.9 % 100 mL IVPB  Status:  Discontinued       ? 2 g ?200 mL/hr over 30 Minutes Intravenous Every 8 hours 08/02/21 1817 08/03/21 0157  ? 08/02/21 1930  linezolid (ZYVOX) IVPB 600 mg       ? 600 mg ?300 mL/hr over 60 Minutes Intravenous Every 12 hours 08/02/21 1827    ? 08/02/21 1745  ceFEPIme (MAXIPIME) 2 g in sodium chloride 0.9 % 100 mL IVPB       ? 2 g ?200 mL/hr over 30 Minutes Intravenous  Once 08/02/21 1741 08/02/21 1942  ? 08/02/21 1745  metroNIDAZOLE (FLAGYL) IVPB 500 mg       ? 500 mg ?100 mL/hr over 60 Minutes  Intravenous  Once 08/02/21 1741 08/02/21 2118  ? 08/02/21 1745  vancomycin (VANCOCIN) IVPB 1000 mg/200 mL premix  Status:  Discontinued       ? 1,000 mg ?200 mL/hr over 60 Minutes Intravenous  Once 08/02/21 1741 08/02/21 1828  ? ?  ? ? ? ?Medication:  ? buPROPion ER  100 mg Oral BID  ? Chlorhexidine Gluconate Cloth  6 each Topical Q0600  ? diltiazem  360 mg Oral Daily  ? heparin  5,000 Units Subcutaneous Q8H  ? insulin aspart  0-5 Units Subcutaneous QHS  ? insulin aspart  0-9 Units Subcutaneous TID WC  ? lactobacillus  1 g Oral TID WC  ? metoprolol succinate  50 mg Oral Daily  ? mometasone-formoterol  2 puff Inhalation BID  ? pantoprazole  20 mg Oral Daily  ? predniSONE  5 mg Oral Daily  ? ? ?acetaminophen **OR** acetaminophen, albuterol, LORazepam, ondansetron **OR** ondansetron (ZOFRAN) IV, oxyCODONE ? ? ?Objective:  ? ?Vitals:  ? 08/03/21 0900 08/03/21 1000 08/03/21 1100 08/03/21  1200  ?BP: (!) 137/102 112/62 122/86 123/67  ?Pulse: (!) 131 (!) 112 (!) 103 (!) 102  ?Resp: '15 18 16 17  ' ?Temp: 99.1 ?F (37.3 ?C) 99.7 ?F (37.6 ?C) 99.5 ?F (37.5 ?C)   ?TempSrc:      ?SpO2: 100% 100% 97% 98%  ?Weight:

## 2021-08-03 NOTE — H&P (Signed)
?History and Physical  ? ? ?Patient: [name] PRF:[id number] DOB: [birth year] ?DOA: 08/02/2021 ?DOS: the patient was seen and examined on 08/03/2021 ?PCP: Aletha Halim., PA-C  ?Patient coming from: [facility] ? ?Chief Complaint:  ?Chief Complaint  ?Patient presents with  ? Fatigue  ? ?HPI: [name] is a 76 y.o. female with medical history significant of atrial fibrillation, history of stroke, hyperlipidemia, diabetes mellitus type 2, peripheral vascular disease, hypertension, anxiety, and more presents to the ED with a chief complaint of altered mental status.  Unfortunately patient is somnolent and nonverbal.  She is not providing any history at this time.  It is reported that she was lethargic, and just not acting herself at her nursing home.  Patient presented to the ED hypothermic, tachycardic, tachypneic.  She has a history of UTI with VRE so she was started on linezolid and cefepime.  In the ER she had negative respiratory panel.  Blood cultures and urine cultures obtained.  CT head showed no acute findings.  Chest x-ray showed bibasilar atelectasis or edema without definitive infection.  EKG showed a heart rate of 78, atrial fibrillation, QTc 540.  Patient was given 1.5 L bolus and started on fluids LR 150 mL/h.  She is also started on Flagyl.  Work-up revealed a UA that was indicative of UTI.  Admission was requested for sepsis. ? ?Patient does have DNR form at bedside. ?Review of Systems: unable to review all systems due to the inability of the patient to answer questions. ?Past Medical History:  ?Diagnosis Date  ? Abnormal LFTs 07/24/2017  ? Actinic keratosis   ? Acute CVA (cerebrovascular accident) ([place])   ? Acute deep vein thrombosis (DVT) of left lower extremity ([place]) 05/06/2014  ? Anemia   ? hx of  ? Anemia of chronic renal failure, stage 3 (moderate) ([place]) 07/17/2015  ? Anticoagulant causing adverse effect in therapeutic use 07/17/2015  ? Anxiety   ? Arthritis   ? rheumatoid  ? Atrial  fibrillation ([place])   ? a. persistent, she remains on Xarelto  ? Carpal tunnel syndrome   ? Chronic diastolic (congestive) heart failure (HCC)   ? a. 04/2016: echo showing a preserved EF of 55-60% with mild MR. LA and RA midly dilated.   ? Edema   ? left leg at ankle resolved now  ? Gastroesophageal reflux disease   ? Hepatitis 1970  ? not sure what kind  ? Hiatal hernia   ? Hyperlipidemia   ? Hypertension   ? Iron deficiency anemia due to chronic blood loss 07/17/2015  ? Jaundice   ? age 2  ? Peripheral vascular disease ([place]) yrs ago  ? DVT left lower leg questionale told by 2 drs i had no clot, 1 md said i did  ? Rheumatoid arthritis(714.0)   ? Right knee pain   ? Stroke [facility])   ? Type II diabetes mellitus ([place])   ? type2  ? ?Past Surgical History:  ?Procedure Laterality Date  ? CATARACT EXTRACTION Bilateral   ? COLONOSCOPY WITH PROPOFOL N/A 02/20/2015  ? Procedure: COLONOSCOPY WITH PROPOFOL;  Surgeon: Carol Ada, MD;  Location: [facility] ENDOSCOPY;  Service: Endoscopy;  Laterality: N/A;  ? ENTEROSCOPY N/A 03/23/2016  ? Procedure: ENTEROSCOPY;  Surgeon: Carol Ada, MD;  Location: [facility];  Service: Endoscopy;  Laterality: N/A;  ? ENTEROSCOPY N/A 06/17/2016  ? Procedure: ENTEROSCOPY;  Surgeon: Carol Ada, MD;  Location: [facility] END[facility];  Service: Endoscopy;  Laterality: N/A;  ? ESOPHAGOGASTRODUODENOSCOPY (EGD)  WITH PROPOFOL N/A 02/20/2015  ? Procedure: ESOPHAGOGASTRODUODENOSCOPY (EGD) WITH PROPOFOL;  Surgeon: Carol Ada, MD;  Location: [facility] ENDOSCOPY;  Service: Endoscopy;  Laterality: N/A;  ? GIVENS CAPSULE STUDY N/A 03/21/2016  ? Procedure: GIVENS CAPSULE STUDY;  Surgeon: Carol Ada, MD;  Location: [facility];  Service: Endoscopy;  Laterality: N/A;  ? HAND SURGERY  1995 and 1996  ? artificial joints both hands  ? JOINT REPLACEMENT    ? LUMBAR LAMINECTOMY/DECOMPRESSION MICRODISCECTOMY N/A 02/10/2016  ? Procedure: MICROLUMBAR DECOMPRESSION L4-L5 AND L3- L4, AND EXCISION OF SYNOVIAL CYST L4-L5;  Surgeon: Susa Day, MD;  Location: [facility] ORS;  Service: Orthopedics;  Laterality: N/A;  ? TOTAL HIP ARTHROPLASTY Left 2009  ? TOTAL HIP ARTHROPLASTY Right 04/03/2013  ? Procedure: RIGHT TOTAL HIP ARTHROPLASTY ANTERIOR APPROACH;  Surgeon: Gearlean Alf, MD;  Location: [facility];  Service: Orthopedics;  Laterality: Right;  ? ?Social History:  reports that she has never smoked. She has never used smokeless tobacco. She reports that she does not drink alcohol and does not use drugs. ? ?Allergies  ?Allergen Reactions  ? Promethazine Other (See Comments)  ?  Not listed on MAR, unknown reaction  ? ? ?Family History  ?Problem Relation Age of Onset  ? Pneumonia Mother 26  ? Stroke Father 49  ? Heart failure Sister   ? Hypertension Sister   ? Heart attack Neg Hx   ? ? ?Prior to Admission medications   ?Medication Sig Start Date End Date Taking? Authorizing Provider  ?acetaminophen (TYLENOL) 650 MG CR tablet Take 1,300 mg by mouth in the morning and at bedtime.   Yes [provider]  ?albuterol (VENTOLIN HFA) 108 (90 Base) MCG/ACT inhaler Inhale 2 puffs into the lungs every 4 (four) hours as needed for wheezing or shortness of breath.   Yes [provider]  ?azelastine (OPTIVAR) 0.05 % ophthalmic solution Place 1 drop into both eyes 2 (two) times daily.   Yes [provider]  ?buPROPion ER (WELLBUTRIN SR) 100 MG 12 hr tablet Take 1 tablet (100 mg total) by mouth 2 (two) times daily. 07/30/21  Yes Nita Sells, MD  ?carboxymethylcellulose (ARTIFICIAL TEARS) 1 % ophthalmic solution Place 2 drops into both eyes in the morning.   Yes [provider]  ?cycloSPORINE (RESTASIS) 0.05 % ophthalmic emulsion Place 1 drop into both eyes 2 (two) times daily.   Yes [provider]  ?diclofenac Sodium (VOLTAREN) 1 % GEL Apply 2 g topically daily as needed for pain. 03/15/20  Yes [provider]  ?diltiazem (CARDIZEM CD) 360 MG 24 hr capsule Take 1 capsule (360 mg total) by mouth daily. 07/31/21  Yes  Nita Sells, MD  ?ferrous sulfate 325 (65 FE) MG tablet Take 1 tablet (325 mg total) by mouth 2 (two) times daily with a meal. 07/30/21  Yes Nita Sells, MD  ?fluticasone-salmeterol (ADVAIR HFA) 115-21 MCG/ACT inhaler Inhale 2 puffs into the lungs 2 times daily at 12 noon and 4 pm.   Yes [provider]  ?gabapentin (NEURONTIN) 100 MG capsule Take 1 capsule (100 mg total) by mouth 2 (two) times daily. 07/30/21  Yes Nita Sells, MD  ?insulin glargine (LANTUS) 100 UNIT/ML injection Inject 0.12 mLs (12 Units total) into the skin daily. Hold if BS is less than 150 07/30/21  Yes Nita Sells, MD  ?insulin lispro (HUMALOG) 100 UNIT/ML injection Inject 6 Units into the skin in the morning and at bedtime. Hold if BS <100   Yes [provider]  ?  magnesium oxide (MAG-OX) 400 MG tablet Take 400 mg by mouth 2 (two) times daily.   Yes [provider]  ?metoprolol tartrate (LOPRESSOR) 25 MG tablet Take 1 tablet (25 mg total) by mouth 2 (two) times daily. 07/30/21  Yes Nita Sells, MD  ?nitroGLYCERIN (NITROSTAT) 0.4 MG SL tablet Place 1 tablet (0.4 mg total) under the tongue every 5 (five) minutes as needed for chest pain (x 3 doses). 11/28/16  Yes Lelon Perla, MD  ?omeprazole (PRILOSEC OTC) 20 MG tablet Take 20 mg by mouth daily.   Yes [provider]  ?pantoprazole (PROTONIX) 20 MG tablet Take 1 tablet (20 mg total) by mouth daily. 07/31/21  Yes Nita Sells, MD  ?Pollen Extracts (PROSTAT PO) Take 30 mLs by mouth daily.   Yes [provider]  ?predniSONE (DELTASONE) 5 MG tablet Take 5 mg by mouth daily.   Yes [provider]  ?saccharomyces boulardii (FLORASTOR) 250 MG capsule Take 250 mg by mouth daily.   Yes [provider]  ?vitamin C (ASCORBIC ACID) 500 MG tablet Take 500 mg by mouth 2 (two) times daily.   Yes [provider]  ?zinc gluconate 50 MG tablet Take 50 mg by mouth daily.   Yes [provider]  ?predniSONE (DELTASONE) 10 MG tablet Take 10 mg by mouth daily. 06/25/21   [provider]  ?ferrous gluconate (FERGON) 324 MG tablet Take 1 tablet (324 mg total) by mouth 2 (two) times d

## 2021-08-03 NOTE — Assessment & Plan Note (Signed)
-   Continue metoprolol and diltiazem ?-Continue to monitor ?

## 2021-08-03 NOTE — Consult Note (Signed)
? ?                                                                                ?Consultation Note ?Date: 08/03/2021  ? ?Patient Name: [name]  ?DOB: [birth year]  MRN: [id number]  Age / Sex: 76 y.o., female  ?PCP: Aletha Halim., PA-C ?Referring Physician: Deatra James, MD ? ?Reason for Consultation: Establishing goals of care ? ?HPI/Patient Profile: 76 y.o. female  with past medical history of atrial fibrillation, chronic diastolic heart failure, history of stroke, HTN/HLD, CKD 3, anemia, PVD, DM 2, GERD, anxiety, resident of [facility] long-term care admitted on 08/02/2021 with sepsis/UTI.  ? ?Clinical Assessment and Goals of Care: ?I have reviewed medical records including EPIC notes, labs and imaging, received report from RN, assessed the patient.  [name] is lying quietly in bed.  She greets me, making and mostly keeping eye contact.  She appears acutely/chronically ill and somewhat frail, but in her usual state of health.  She is alert and oriented x2, able to make her basic needs known.  There is no family at bedside at this time. ? ?We met at the bedside to discuss diagnosis prognosis, GOC, EOL wishes, disposition and options.   Although [name] is well-known to this NP, I introduced Palliative Medicine as specialized medical care for people living with serious illness. It focuses on providing relief from the symptoms and stress of a serious illness. The goal is to improve quality of life for both the patient and the family. ? ?We discussed a brief life review of the patient.  [name] has been a resident of [facility] skilled nursing facility since approximately December 2019.  She participated in many trials to help with rheumatoid arthritis medications. ? ?We then focused on their current illness.  We talk about [name]'s acute illness, septic with UTI.  We talk about the treatment plan and time for outcomes.   At this point [name] states that she would want to  return to to [facility].  The natural disease trajectory and expectations at EOL were discussed. ? ?Advanced directives, concepts specific to code status, artifical feeding and hydration, and rehospitalization were not discussed as [name] is a long-term DNR.[name] ? ?Discussed the importance of continued conversation with family and the medical providers regarding overall plan of care and treatment options, ensuring decisions are within the context of the patient?s values and GOCs.  Questions and concerns were addressed.   The family was encouraged to call with questions or concerns.  PMT will continue to support holistically. ? ?Conference with attending, bedside nursing staff, transition of care team related to patient condition, needs, goals of care, disposition. ? ? ?HCPOA  ?HCPOA -son, [name]. ?  ? ?SUMMARY OF RECOMMENDATIONS   ?At this point continue to treat the treatable but no CPR or intubation. ?Return to [facility] under long-term care. ?At this point continue to rehospitalize as needed. ? ? ?Code Status/Advance Care Planning: ?DNR ? ?Symptom Management:  ?Per hospitalist, no additional needs at this time. ? ?Palliative Prophylaxis:  ?Oral Care and Turn Reposition ? ?Additional Recommendations (Limitations, Scope, Preferences): ?Continue to treat  the treatable but no CPR or intubation. ? ?Psycho-social/Spiritual:  ?Desire for further Chaplaincy support:no ?Additional Recommendations: Caregiving  Support/Resources and Education on Hospice ? ?Prognosis:  ?Unable to determine, based on outcomes.  Guarded.  5 hospital stays since mid February of this year, approximately 10 weeks. ? ?Discharge Planning:  Return to [facility] under long-term care.  Would benefit from outpatient palliative/hospice services.   ? ?  ? ?Primary Diagnoses: ?Present on Admission: ? Sepsis ([name]) ? Type 2 diabetes mellitus with hyperlipidemia ([name]) ? Hypertension ? Dementia with agitation ([place]) ? A-fib ([place]) ? Acute metabolic  encephalopathy ? ? ?I have reviewed the medical record, interviewed the patient and family, and examined the patient. The following aspects are pertinent. ? ?Past Medical History:  ?Diagnosis Date  ? Abnormal LFTs 07/24/2017  ? Actinic keratosis   ? Acute CVA (cerebrovascular accident) ([name])   ? Acute deep vein thrombosis (DVT) of left lower extremity ([place]) 05/06/2014  ? Anemia   ? hx of  ? Anemia of chronic renal failure, stage 3 (moderate) ([name]) 07/17/2015  ? Anticoagulant causing adverse effect in therapeutic use 07/17/2015  ? Anxiety   ? Arthritis   ? rheumatoid  ? Atrial fibrillation ([name])   ? a. persistent, she remains on Xarelto  ? Carpal tunnel syndrome   ? Chronic diastolic (congestive) heart failure (HCC)   ? a. 04/2016: echo showing a preserved EF of 55-60% with mild MR. LA and RA midly dilated.   ? Edema   ? left leg at ankle resolved now  ? Gastroesophageal reflux disease   ? Hepatitis 1970  ? not sure what kind  ? Hiatal hernia   ? Hyperlipidemia   ? Hypertension   ? Iron deficiency anemia due to chronic blood loss 07/17/2015  ? Jaundice   ? age 61  ? Peripheral vascular disease ([place]) yrs ago  ? DVT left lower leg questionale told by 2 drs i had no clot, 1 md said i did  ? Rheumatoid arthritis(714.0)   ? Right knee pain   ? Stroke [facility])   ? Type II diabetes mellitus ([name])   ? type2  ? ?Social History  ? ?Socioeconomic History  ? Marital status: Divorced  ?  Spouse name: Not on file  ? Number of children: Not on file  ? Years of education: Not on file  ? Highest education level: Not on file  ?Occupational History  ? Occupation: Retired  ?Tobacco Use  ? Smoking status: Never  ? Smokeless tobacco: Never  ?Vaping Use  ? Vaping Use: Never used  ?Substance and Sexual Activity  ? Alcohol use: No  ?  Alcohol/week: 0.0 standard drinks  ? Drug use: No  ? Sexual activity: Not Currently  ?Other Topics Concern  ? Not on file  ?Social History Narrative  ? Not on file  ? ?Social Determinants of Health  ? ?Financial Resource  Strain: Not on file  ?Food Insecurity: Not on file  ?Transportation Needs: Not on file  ?Physical Activity: Not on file  ?Stress: Not on file  ?Social Connections: Not on file  ? ?Family History  ?Problem Relation Age of Onset  ? Pneumonia Mother 57  ? Stroke Father 74  ? Heart failure Sister   ? Hypertension Sister   ? Heart attack Neg Hx   ? ?Scheduled Meds: ? buPROPion ER  100 mg Oral BID  ? Chlorhexidine Gluconate Cloth  6 each Topical Q0600  ? diltiazem  360 mg Oral Daily  ?  heparin  5,000 Units Subcutaneous Q8H  ? insulin aspart  0-5 Units Subcutaneous QHS  ? insulin aspart  0-9 Units Subcutaneous TID WC  ? lactobacillus  1 g Oral TID WC  ? metoprolol succinate  50 mg Oral Daily  ? mometasone-formoterol  2 puff Inhalation BID  ? pantoprazole  20 mg Oral Daily  ? predniSONE  5 mg Oral Daily  ? ?Continuous Infusions: ? dextrose 5% lactated ringers 75 mL/hr at 08/03/21 0655  ? linezolid (ZYVOX) IV 600 mg (08/03/21 0905)  ? ?PRN Meds:.acetaminophen **OR** acetaminophen, albuterol, LORazepam, ondansetron **OR** ondansetron (ZOFRAN) IV, oxyCODONE ?Medications Prior to Admission:  ?Prior to Admission medications   ?Medication Sig Start Date End Date Taking? Authorizing Provider  ?acetaminophen (TYLENOL) 650 MG CR tablet Take 1,300 mg by mouth in the morning and at bedtime.   Yes [provider]  ?albuterol (VENTOLIN HFA) 108 (90 Base) MCG/ACT inhaler Inhale 2 puffs into the lungs every 4 (four) hours as needed for wheezing or shortness of breath.   Yes [provider]  ?azelastine (OPTIVAR) 0.05 % ophthalmic solution Place 1 drop into both eyes 2 (two) times daily.   Yes [provider]  ?buPROPion ER (WELLBUTRIN SR) 100 MG 12 hr tablet Take 1 tablet (100 mg total) by mouth 2 (two) times daily. 07/30/21  Yes Nita Sells, MD  ?carboxymethylcellulose (ARTIFICIAL TEARS) 1 % ophthalmic solution Place 2 drops into both eyes in the morning.   Yes [provider]  ?cycloSPORINE  (RESTASIS) 0.05 % ophthalmic emulsion Place 1 drop into both eyes 2 (two) times daily.   Yes [provider]  ?diclofenac Sodium (VOLTAREN) 1 % GEL Apply 2 g topically daily as needed for pain. 12

## 2021-08-03 NOTE — Assessment & Plan Note (Addendum)
- ?    Report of hypoglycemia ?-12 units basal insulin at baseline ?-Holding basal insulin at this time ?-Continue with CBG q. AC/HS with SSI coverage ?-Regular diet given hypoglycemia earlier ?-Continue to monitor ?

## 2021-08-03 NOTE — Assessment & Plan Note (Signed)
Most likely due to sepsis and hypothermia that is related to sepsis ?-Shows no acute findings, evidence of old infarct ?-UDS negative ?-Narcan 0.2 mg offered no result ?-See plan for sepsis and plan for hypothermia ?-Continue to monitor ?

## 2021-08-04 ENCOUNTER — Encounter (HOSPITAL_BASED_OUTPATIENT_CLINIC_OR_DEPARTMENT_OTHER): Payer: 59 | Admitting: General Surgery

## 2021-08-04 DIAGNOSIS — Z515 Encounter for palliative care: Secondary | ICD-10-CM

## 2021-08-04 DIAGNOSIS — N390 Urinary tract infection, site not specified: Secondary | ICD-10-CM

## 2021-08-04 DIAGNOSIS — Z7189 Other specified counseling: Secondary | ICD-10-CM

## 2021-08-04 LAB — BASIC METABOLIC PANEL
Anion gap: 9 (ref 5–15)
BUN: 9 mg/dL (ref 8–23)
CO2: 26 mmol/L (ref 22–32)
Calcium: 8.7 mg/dL — ABNORMAL LOW (ref 8.9–10.3)
Chloride: 105 mmol/L (ref 98–111)
Creatinine, Ser: 0.47 mg/dL (ref 0.44–1.00)
GFR, Estimated: >60 mL/min (ref >60)
Glucose, Bld: 91 mg/dL (ref 70–99)
Potassium: 3.7 mmol/L (ref 3.5–5.1)
Sodium: 140 mmol/L (ref 135–145)

## 2021-08-04 LAB — CBC
HCT: 33.6 % — ABNORMAL LOW (ref 36.0–46.0)
Hemoglobin: 10 g/dL — ABNORMAL LOW (ref 12.0–15.0)
MCH: 26.3 pg (ref 26.0–34.0)
MCHC: 29.8 g/dL — ABNORMAL LOW (ref 30.0–36.0)
MCV: 88.4 fL (ref 80.0–100.0)
Platelets: 337 10*3/uL (ref 150–400)
RBC: 3.8 MIL/uL — ABNORMAL LOW (ref 3.87–5.11)
RDW: 20.6 % — ABNORMAL HIGH (ref 11.5–15.5)
WBC: 10.6 10*3/uL — ABNORMAL HIGH (ref 4.0–10.5)
nRBC: 0 % (ref 0.0–0.2)

## 2021-08-04 LAB — GLUCOSE, CAPILLARY
Glucose-Capillary: 88 mg/dL (ref 70–99)
Glucose-Capillary: 131 mg/dL — ABNORMAL HIGH (ref 70–99)
Glucose-Capillary: 93 mg/dL (ref 70–99)
Glucose-Capillary: 107 mg/dL — ABNORMAL HIGH (ref 70–99)

## 2021-08-04 NOTE — Progress Notes (Signed)
?PROGRESS NOTE ? ? ? [name]  XLK:[id number] DOB: Jan 19, 1946 DOA: 08/02/2021 ?PCP: Aletha Halim., PA-C  ? ?  ?Brief Narrative:  ?[name] is a 76 y.o. female with medical history significant of atrial fibrillation, history of stroke, hyperlipidemia, diabetes mellitus type 2, peripheral vascular disease, hypertension, anxiety, and more presents to the ED with a chief complaint of altered mental status.  It is reported that she was lethargic, and just not acting herself at her nursing home.  Patient presented to the ED hypothermic, tachycardic, tachypneic.  She has a history of UTI with VRE so she was started on linezolid and cefepime.  In the ER she had negative respiratory panel.  Blood cultures and urine cultures obtained.  CT head showed no acute findings.  Chest x-ray showed bibasilar atelectasis or edema without definitive infection.  EKG showed a heart rate of 78, atrial fibrillation, QTc 540.  Patient was given 1.5 L bolus and started on fluids LR 150 mL/h.  She is also started on Flagyl.  Work-up revealed a UA that was indicative of UTI.  ? ?New events last 24 hours / Subjective: ?Patient is alert on examination, remains a poor historian overall.  Has no complaints of any pain.  ? ?Assessment & Plan: ?  ?Principal Problem: ?  Sepsis secondary to UTI [facility]) ?Active Problems: ?  Hypertension ?  Type 2 diabetes mellitus with hyperlipidemia ([place]) ?  Rheumatoid arthritis ([place]) ?  A-fib ([place]) ?  Acute metabolic encephalopathy ?  Dementia with agitation ([place]) ?  Hypothermia ? ? ?Sepsis secondary to UTI, present on admission ?-Blood cultures pending ?-Urine culture showing Klebsiella pneumonia, susceptibilities to follow ?-Previous history of VRE ?-Continue linezolid ? ?Acute metabolic encephalopathy ?-Secondary to above ?-Improved  ? ?A-fib ?-Continue metoprolol, diltiazem ? ?Diabetes mellitus type 2 ?-SSI  ? ?RA ?-Prednisone  ? ?Dementia ?-With known episodes of agitation while in the  hospital ?-Ativan as needed ? ? ?In agreement with assessment of the pressure ulcer as below:  ?Pressure Injury 08/03/21 Coccyx Medial Stage 2 -  Partial thickness loss of dermis presenting as a shallow open injury with a red, pink wound bed without slough. (Active)  ?08/03/21 0800  ?Location: Coccyx  ?Location Orientation: Medial  ?Staging: Stage 2 -  Partial thickness loss of dermis presenting as a shallow open injury with a red, pink wound bed without slough.  ?Wound Description (Comments):   ?Present on Admission: Yes  ?Dressing Type Foam - Lift dressing to assess site every shift 08/03/21 2155  ? ? ? ?  ? ? ?DVT prophylaxis:  ?heparin injection 5,000 Units Start: 08/03/21 0600 ?SCDs Start: 08/03/21 0158 ? ?Code Status: DNR ?Family Communication: None at bedside ?Disposition Plan:  ?Status is: Inpatient ?Remains inpatient appropriate because: IV antibiotics ? ? ?Antimicrobials:  ?Anti-infectives (From admission, onward)  ? ? Start     Dose/Rate Route Frequency Ordered Stop  ? 08/03/21 0300  ceFEPIme (MAXIPIME) 2 g in sodium chloride 0.9 % 100 mL IVPB  Status:  Discontinued       ? 2 g ?200 mL/hr over 30 Minutes Intravenous Every 8 hours 08/02/21 1817 08/03/21 0157  ? 08/02/21 1930  linezolid (ZYVOX) IVPB 600 mg       ? 600 mg ?300 mL/hr over 60 Minutes Intravenous Every 12 hours 08/02/21 1827    ? 08/02/21 1745  ceFEPIme (MAXIPIME) 2 g in sodium chloride 0.9 % 100 mL IVPB       ? 2 g ?200  mL/hr over 30 Minutes Intravenous  Once 08/02/21 1741 08/02/21 1942  ? 08/02/21 1745  metroNIDAZOLE (FLAGYL) IVPB 500 mg       ? 500 mg ?100 mL/hr over 60 Minutes Intravenous  Once 08/02/21 1741 08/02/21 2118  ? 08/02/21 1745  vancomycin (VANCOCIN) IVPB 1000 mg/200 mL premix  Status:  Discontinued       ? 1,000 mg ?200 mL/hr over 60 Minutes Intravenous  Once 08/02/21 1741 08/02/21 1828  ? ?  ? ? ? ?Objective: ?Vitals:  ? 08/03/21 2232 08/04/21 4481 08/04/21 0302 08/04/21 0805  ?BP: 119/65 (!) 126/100 129/73   ?Pulse: (!) 107  (!) 110 (!) 101   ?Resp: 18 19    ?Temp: 98 ?F (36.7 ?C) 98 ?F (36.7 ?C)    ?TempSrc:      ?SpO2: 100% 100% 100% 97%  ?Weight:      ?Height:      ? ? ?Intake/Output Summary (Last 24 hours) at 08/04/2021 1152 ?Last data filed at 08/04/2021 0900 ?Gross per 24 hour  ?Intake 1859.64 ml  ?Output 1650 ml  ?Net 209.64 ml  ? ?Filed Weights  ? 08/02/21 1547 08/03/21 0409  ?Weight: 63.5 kg 65.8 kg  ? ? ?Examination:  ?General exam: Appears calm ?Respiratory system: Clear to auscultation. Respiratory effort normal. No respiratory distress.  ?Cardiovascular system: S1 & S2 heard. No murmurs. No pedal edema. ?Gastrointestinal system: Abdomen is nondistended, soft and nontender. Normal bowel sounds heard. ?Central nervous system: Alert  ? ? ?Data Reviewed: I have personally reviewed following labs and imaging studies ? ?CBC: ?Recent Labs  ?Lab 07/29/21 ?0316 07/30/21 ?0303 08/02/21 ?1626 08/03/21 ?0341 08/04/21 ?8563  ?WBC 8.9 8.2 13.5* 7.4 10.6*  ?NEUTROABS  --   --  12.0* 5.9  --   ?HGB 10.0* 9.1* 10.8* 9.0* 10.0*  ?HCT 32.6* 31.0* 36.2 29.8* 33.6*  ?MCV 85.1 85.9 86.6 86.4 88.4  ?PLT 368 372 385 344 337  ? ?Basic Metabolic Panel: ?Recent Labs  ?Lab 07/29/21 ?0316 07/30/21 ?0303 08/02/21 ?1626 08/03/21 ?0341 08/04/21 ?1497  ?NA 137  --  140 142 140  ?K 4.2  --  3.8 3.7 3.7  ?CL 109  --  107 110 105  ?CO2 22  --  '24 24 26  '$ ?GLUCOSE 95  --  133* 115* 91  ?BUN 11  --  '17 15 9  '$ ?CREATININE 0.62  --  0.56 0.51 0.47  ?CALCIUM 8.4*  --  8.7* 8.2* 8.7*  ?MG 1.6* 2.3 1.8 1.5*  --   ? ?GFR: ?Estimated Creatinine Clearance: 58.2 mL/min (by C-G formula based on SCr of 0.47 mg/dL). ?Liver Function Tests: ?Recent Labs  ?Lab 07/29/21 ?0316 08/02/21 ?1626 08/03/21 ?0341  ?AST 16 19 14*  ?ALT '14 19 13  '$ ?ALKPHOS 182* 229* 172*  ?BILITOT 0.5 0.4 0.6  ?PROT 4.9* 6.9 5.4*  ?ALBUMIN <1.5* 2.4* 1.9*  ? ?No results for input(s): LIPASE, AMYLASE in the last 168 hours. ?No results for input(s): AMMONIA in the last 168 hours. ?Coagulation Profile: ?Recent  Labs  ?Lab 08/02/21 ?1626 08/03/21 ?0341  ?INR 1.2 1.2  ? ?Cardiac Enzymes: ?No results for input(s): CKTOTAL, CKMB, CKMBINDEX, TROPONINI in the last 168 hours. ?BNP (last 3 results) ?No results for input(s): PROBNP in the last 8760 hours. ?HbA1C: ?No results for input(s): HGBA1C in the last 72 hours. ?CBG: ?Recent Labs  ?Lab 08/03/21 ?1117 08/03/21 ?1611 08/03/21 ?2226 08/04/21 ?0263 08/04/21 ?1107  ?GLUCAP 127* 120* 134* 88 131*  ? ?Lipid Profile: ?No results  for input(s): CHOL, HDL, LDLCALC, TRIG, CHOLHDL, LDLDIRECT in the last 72 hours. ?Thyroid Function Tests: ?Recent Labs  ?  08/03/21 ?0341  ?TSH 1.694  ? ?Anemia Panel: ?No results for input(s): VITAMINB12, FOLATE, FERRITIN, TIBC, IRON, RETICCTPCT in the last 72 hours. ?Sepsis Labs: ?Recent Labs  ?Lab 08/02/21 ?1626 08/02/21 ?1816 08/03/21 ?0341 08/03/21 ?8657  ?PROCALCITON  --   --  1.08  --   ?LATICACIDVEN 1.7 1.7 1.5 1.6  ? ? ?Recent Results (from the past 240 hour(s))  ?Resp Panel by RT-PCR (Flu A&B, Covid) Nasopharyngeal Swab     Status: None  ? Collection Time: 08/02/21  4:00 PM  ? Specimen: Nasopharyngeal Swab; Nasopharyngeal(NP) swabs in vial transport medium  ?Result Value Ref Range Status  ? SARS Coronavirus 2 by RT PCR NEGATIVE NEGATIVE Final  ?  Comment: (NOTE) ?SARS-CoV-2 target nucleic acids are NOT DETECTED. ? ?The SARS-CoV-2 RNA is generally detectable in upper respiratory ?specimens during the acute phase of infection. The lowest ?concentration of SARS-CoV-2 viral copies this assay can detect is ?138 copies/mL. A negative result does not preclude SARS-Cov-2 ?infection and should not be used as the sole basis for treatment or ?other patient management decisions. A negative result may occur with  ?improper specimen collection/handling, submission of specimen other ?than nasopharyngeal swab, presence of viral mutation(s) within the ?areas targeted by this assay, and inadequate number of viral ?copies(<138 copies/mL). A negative result must be  combined with ?clinical observations, patient history, and epidemiological ?information. The expected result is Negative. ? ?Fact Sheet for Patients:  ?EntrepreneurPulse.com.au ? ?Fact Sheet for Healthcare

## 2021-08-04 NOTE — Progress Notes (Signed)
Palliative:   ?[name], [name], is lying quietly in bed.  She appears acutely/chronically ill and quite frail, pale.  She is more alert than yesterday.  She is oriented to person and situation.  I believe that she can make her basic needs known.  There is no family at bedside at this time. ?We talk about her acute illness.  We talked about septic UTI and the treatment plan.  We talk about her multiple hospitalizations over the last 6 months.  Historically, [name] shares that she wants to continue to be rehospitalized and treated.  Today she shares that she is tired of coming to the hospital and "being stuck".  I shared that we will talk more about her plan of care tomorrow when she is more alert. ? ?Conference with attending, bedside nursing staff, transition of care team related to patient condition, needs, goals of care, disposition. ? ?Plan: At this point continue to treat the treatable but no CPR or intubation.  Time for outcomes.  Anticipate return to [facility] under [facility]-term care.  Would benefit from outpatient palliative services. ? ?25 minutes ?Christina Axe, NP ?Palliative medicine team ?Team phone [phone] ?Greater than 50% of this time was spent counseling and coordinating care related to the above assessment and plan. ?

## 2021-08-05 DIAGNOSIS — R6510 Systemic inflammatory response syndrome (SIRS) of non-infectious origin without acute organ dysfunction: Secondary | ICD-10-CM

## 2021-08-05 LAB — URINE CULTURE: Culture: >=100000 — AB

## 2021-08-05 LAB — BASIC METABOLIC PANEL
Anion gap: 11 (ref 5–15)
BUN: 7 mg/dL — ABNORMAL LOW (ref 8–23)
CO2: 23 mmol/L (ref 22–32)
Calcium: 8.5 mg/dL — ABNORMAL LOW (ref 8.9–10.3)
Chloride: 104 mmol/L (ref 98–111)
Creatinine, Ser: 0.54 mg/dL (ref 0.44–1.00)
GFR, Estimated: >60 mL/min (ref >60)
Glucose, Bld: 69 mg/dL — ABNORMAL LOW (ref 70–99)
Potassium: 3.7 mmol/L (ref 3.5–5.1)
Sodium: 138 mmol/L (ref 135–145)

## 2021-08-05 LAB — CBC
HCT: 35.8 % — ABNORMAL LOW (ref 36.0–46.0)
Hemoglobin: 10.2 g/dL — ABNORMAL LOW (ref 12.0–15.0)
MCH: 25.2 pg — ABNORMAL LOW (ref 26.0–34.0)
MCHC: 28.5 g/dL — ABNORMAL LOW (ref 30.0–36.0)
MCV: 88.6 fL (ref 80.0–100.0)
Platelets: 303 10*3/uL (ref 150–400)
RBC: 4.04 MIL/uL (ref 3.87–5.11)
RDW: 20.7 % — ABNORMAL HIGH (ref 11.5–15.5)
WBC: 9.5 10*3/uL (ref 4.0–10.5)
nRBC: 0 % (ref 0.0–0.2)

## 2021-08-05 LAB — GLUCOSE, CAPILLARY
Glucose-Capillary: 79 mg/dL (ref 70–99)
Glucose-Capillary: 101 mg/dL — ABNORMAL HIGH (ref 70–99)

## 2021-08-05 LAB — MAGNESIUM: Magnesium: 1.4 mg/dL — ABNORMAL LOW (ref 1.7–2.4)

## 2021-08-05 MED ORDER — MAGNESIUM SULFATE 2 GM/50ML IV SOLN
2.0000 g | Freq: Once | INTRAVENOUS | Status: AC
  Administered 2021-08-05: 2 g via INTRAVENOUS
  Filled 2021-08-05: qty 50

## 2021-08-05 MED ORDER — INSULIN ASPART 100 UNIT/ML IJ SOLN: 0.0000 [IU] | Freq: Three times a day (TID) | INTRAMUSCULAR | Status: DC

## 2021-08-05 MED ORDER — SODIUM CHLORIDE 0.9 % IV SOLN
1.0000 g | Freq: Once | INTRAVENOUS | Status: AC
  Administered 2021-08-05: 1 g via INTRAVENOUS
  Filled 2021-08-05: qty 20

## 2021-08-05 NOTE — Progress Notes (Signed)
Patient discharged back to [facility] via EMS, Discharge papers with patient IV removed from bilateral forearms.  ?

## 2021-08-05 NOTE — TOC Transition Note (Signed)
Transition of Care (TOC) - CM/SW Discharge Note ? ? ?Patient Details  ?Name: [name] ?MRN: [id number] ?Date of Birth: Jul 27, 1945 ? ?Transition of Care (TOC) CM/SW Contact:  ?Shade Flood, LCSW ?Phone Number: ?08/05/2021, 11:32 AM ? ? ?Clinical Narrative:    ? ?Pt stable for dc today per MD. Updated Melissa at [facility] and pt can return today. Attempted to notify pt's son but could only leave a HIPPA compliant voicemail message as he did not answer.  ? ?DC clinical sent electronically. RN to call report. EMS arranged.  ? ?There are no other TOC needs for dc. ? ?Final next level of care: [place] ?Barriers to Discharge: Barriers Resolved ? ? ?Patient Goals and CMS Choice ?Patient states their goals for this hospitalization and ongoing recovery are:: get better ?  ?Choice offered to / list presented to : Adult Children ? ?Discharge Placement ?  ?           ?  ?Patient to be transferred to facility by: [facility] ?Name of family member notified: Fara Olden via HIPPA compliant VMM ?Patient and family notified of of transfer: 08/05/21 ? ?Discharge Plan and Services ?In-house Referral: Clinical Social Work ?  ?Post Acute Care Choice: Resumption of Svcs/PTA Provider          ?  ?  ?  ?  ?  ?  ?  ?  ?  ?  ? ?Social Determinants of Health (SDOH) Interventions ?  ? ? ?Readmission Risk Interventions ? ?  08/03/2021  ? 11:07 AM 07/26/2021  ?  2:12 PM 06/29/2021  ? 11:03 AM  ?Readmission Risk Prevention Plan  ?Transportation Screening Complete Complete Complete  ?Medication Review Press photographer) Complete  Complete  ?[place] or Home Care Consult Complete Complete Complete  ?SW Recovery Care/Counseling Consult Complete Complete Complete  ?Palliative Care Screening Not Applicable  Complete  ?Skilled Nursing Facility Complete Complete Complete  ? ? ? ? ? ?

## 2021-08-05 NOTE — Discharge Summary (Signed)
Physician Discharge Summary  ?[name] NAT:[id number] DOB: 02/21/1946 DOA: 08/02/2021 ? ?PCP: Aletha Halim., PA-C ? ?Admit date: 08/02/2021 ?Discharge date: 08/05/2021 ? ?Admitted From: [facility]  ?Disposition:  [facility]  ? ?Recommendations for Outpatient Follow-up:  ?Recommend outpatient palliative care medicine to follow ? ?CODE STATUS: DNR ?Diet recommendation: Regular diet ? ?Brief/Interim Summary: ?[name] is a 76 y.o. female with medical history significant of atrial fibrillation, history of stroke, hyperlipidemia, diabetes mellitus type 2, peripheral vascular disease, hypertension, anxiety, and more presents to the ED with a chief complaint of altered mental status.  It is reported that she was lethargic, and just not acting herself at her nursing home.  Patient presented to the ED hypothermic, tachycardic, tachypneic.  She has a history of UTI with VRE so she was started on linezolid and cefepime.  In the ER she had negative respiratory panel.  Blood cultures and urine cultures obtained.  CT head showed no acute findings.  Chest x-ray showed bibasilar atelectasis or edema without definitive infection.  EKG showed a heart rate of 78, atrial fibrillation, QTc 540.  Patient was given 1.5 L bolus and started on fluids LR 150 mL/h.  She is also started on Flagyl.  Work-up revealed a UA that was indicative of UTI.  To her previous history of VRE UTI, she was started on linezolid.  Her urine culture this hospitalization showed ESBL Klebsiella.  Case was discussed with infectious disease remotely.  Patient has remained stable without targeted coverage for ESBL.  She is unable to specify her symptoms due to her dementia.  This was likely a colonization instead of a true infection.  She did receive 1 dose of IV Merrem.  Due to her continued decline and repeat hospitalizations, dementia, palliative care was consulted. ? ? ?Discharge Diagnoses:  ? ?Principal Problem: ?  SIRS (systemic  inflammatory response syndrome) (HCC) ?Active Problems: ?  Hypertension ?  Type 2 diabetes mellitus with hyperlipidemia ([place]) ?  Rheumatoid arthritis ([place]) ?  A-fib ([place]) ?  Acute metabolic encephalopathy ?  Dementia with agitation ([place]) ?  Hypothermia ? ? ?SIRS, sepsis ruled out ?-Blood cultures negative ?-Previous history of VRE UTI ?-Urine culture showing Klebsiella pneumonia, ESBL ?-Patient has been receiving linezolid.  She has remained stable without targeted coverage for ESBL.  Case discussed with infectious disease, likely colonization instead of true infection ?-Stop antibiotics  ?  ?Acute metabolic encephalopathy ?-Likely in setting of worsening dementia  ? ?Dementia ?-With known episodes of agitation while in the hospital ?-Ativan as needed ?-Recommend palliative care medicine to follow outpatient ? ?A-fib ?-Continue metoprolol, diltiazem ? ?Diabetes mellitus type 2 ?-SSI  ?  ?RA ?-Prednisone  ?  ? ?In agreement with assessment of the pressure ulcer as below:  ?Pressure Injury 08/03/21 Coccyx Medial Stage 2 -  Partial thickness loss of dermis presenting as a shallow open injury with a red, pink wound bed without slough. (Active)  ?08/03/21 0800  ?Location: Coccyx  ?Location Orientation: Medial  ?Staging: Stage 2 -  Partial thickness loss of dermis presenting as a shallow open injury with a red, pink wound bed without slough.  ?Wound Description (Comments):   ?Present on Admission: Yes  ?Dressing Type Foam - Lift dressing to assess site every shift 08/03/21 2155  ? ? ?  ? ?Discharge Instructions ? ?Discharge Instructions   ? ? Diet general   Complete by: As directed ?  ? Increase activity slowly   Complete by: As  directed ?  ? No wound care   Complete by: As directed ?  ? ?  ? ?Allergies as of 08/05/2021   ? ?   Reactions  ? Promethazine Other (See Comments)  ? Not listed on MAR, unknown reaction  ? ?  ? ?  ?Medication List  ?  ? ?STOP taking these medications   ? ?insulin glargine 100 UNIT/ML  injection ?Commonly known as: LANTUS ?  ?oxyCODONE 5 MG immediate release tablet ?Commonly known as: Oxy IR/ROXICODONE ?  ? ?  ? ?TAKE these medications   ? ?acetaminophen 650 MG CR tablet ?Commonly known as: TYLENOL ?Take 1,300 mg by mouth in the morning and at bedtime. ?  ?albuterol 108 (90 Base) MCG/ACT inhaler ?Commonly known as: VENTOLIN HFA ?Inhale 2 puffs into the lungs every 4 (four) hours as needed for wheezing or shortness of breath. ?  ?Artificial Tears 1 % ophthalmic solution ?Generic drug: carboxymethylcellulose ?Place 2 drops into both eyes in the morning. ?  ?azelastine 0.05 % ophthalmic solution ?Commonly known as: OPTIVAR ?Place 1 drop into both eyes 2 (two) times daily. ?  ?buPROPion ER 100 MG 12 hr tablet ?Commonly known as: Wellbutrin SR ?Take 1 tablet (100 mg total) by mouth 2 (two) times daily. ?  ?cycloSPORINE 0.05 % ophthalmic emulsion ?Commonly known as: RESTASIS ?Place 1 drop into both eyes 2 (two) times daily. ?  ?diclofenac Sodium 1 % Gel ?Commonly known as: VOLTAREN ?Apply 2 g topically daily as needed for pain. ?  ?diltiazem 360 MG 24 hr capsule ?Commonly known as: CARDIZEM CD ?Take 1 capsule (360 mg total) by mouth daily. ?  ?ferrous sulfate 325 (65 FE) MG tablet ?Take 1 tablet (325 mg total) by mouth 2 (two) times daily with a meal. ?  ?fluticasone-salmeterol 115-21 MCG/ACT inhaler ?Commonly known as: ADVAIR HFA ?Inhale 2 puffs into the lungs 2 times daily at 12 noon and 4 pm. ?  ?gabapentin 100 MG capsule ?Commonly known as: NEURONTIN ?Take 1 capsule (100 mg total) by mouth 2 (two) times daily. ?  ?insulin lispro 100 UNIT/ML injection ?Commonly known as: HUMALOG ?Inject 6 Units into the skin in the morning and at bedtime. Hold if BS <100 ?  ?magnesium oxide 400 MG tablet ?Commonly known as: MAG-OX ?Take 400 mg by mouth 2 (two) times daily. ?  ?metoprolol tartrate 25 MG tablet ?Commonly known as: LOPRESSOR ?Take 1 tablet (25 mg total) by mouth 2 (two) times daily. ?  ?nitroGLYCERIN  0.4 MG SL tablet ?Commonly known as: NITROSTAT ?Place 1 tablet (0.4 mg total) under the tongue every 5 (five) minutes as needed for chest pain (x 3 doses). ?  ?omeprazole 20 MG tablet ?Commonly known as: PRILOSEC OTC ?Take 20 mg by mouth daily. ?  ?pantoprazole 20 MG tablet ?Commonly known as: PROTONIX ?Take 1 tablet (20 mg total) by mouth daily. ?  ?predniSONE 5 MG tablet ?Commonly known as: DELTASONE ?Take 5 mg by mouth daily. ?What changed: Another medication with the same name was removed. Continue taking this medication, and follow the directions you see here. ?  ?PROSTAT PO ?Take 30 mLs by mouth daily. ?  ?saccharomyces boulardii 250 MG capsule ?Commonly known as: FLORASTOR ?Take 250 mg by mouth daily. ?  ?vitamin C 500 MG tablet ?Commonly known as: ASCORBIC ACID ?Take 500 mg by mouth 2 (two) times daily. ?  ?zinc gluconate 50 MG tablet ?Take 50 mg by mouth daily. ?  ? ?  ? ? Follow-up Information   ? ?  Aletha Halim., PA-C Follow up.   ?Specialty: Family Medicine ?Contact information: ?[address] ?[place] [address] ?[phone] ? ? ?  ?  ? ?  ?  ? ?  ? ?Allergies  ?Allergen Reactions  ? Promethazine Other (See Comments)  ?  Not listed on MAR, unknown reaction  ? ? ? ?Procedures/Studies: ?CT ANGIO HEAD NECK W WO CM ? ?Result Date: 08/02/2021 ?CLINICAL DATA:  Acute neurologic deficit EXAM: CT ANGIOGRAPHY HEAD AND NECK TECHNIQUE: Multidetector CT imaging of the head and neck was performed using the standard protocol during bolus administration of intravenous contrast. Multiplanar CT image reconstructions and MIPs were obtained to evaluate the vascular anatomy. Carotid stenosis measurements (when applicable) are obtained utilizing NASCET criteria, using the distal internal carotid diameter as the denominator. RADIATION DOSE REDUCTION: This exam was performed according to the departmental dose-optimization program which includes automated exposure control, adjustment of the mA and/or kV according to  patient size and/or use of iterative reconstruction technique. CONTRAST:  60m OMNIPAQUE IOHEXOL 350 MG/ML SOLN COMPARISON:  None. FINDINGS: CTA NECK FINDINGS SKELETON: There is no bony spinal canal stenosis. No lytic or blast

## 2021-08-05 NOTE — Progress Notes (Signed)
Palliative: ?[name], [name], is lying quietly in bed.  She appears acutely/chronically ill and frail, pale.  She is alert, able to make her basic needs known.  There is no family at bedside at this time.  [name] is a little sleepy but she has received a pain pill this morning. ? ?We talk about [name]'s acute health concerns and the treatment plan.  We talked about discharging back to [facility] under long-term care where she has been a resident for many years.  We talk about the benefits of outpatient palliative services for continued goals of care discussion and support.  [name] is agreeable to outpatient palliative services, per transition of care team [facility] provides their own palliative services. ? ?Conference with attending, bedside nursing staff, transition of care team related to patient condition, needs, goals of care, disposition. ? ?Plan: At this point continue to treat the treatable but no CPR or intubation.  Rehospitalize as needed.  Agreeable to outpatient palliative services with hospice of [facility]. ? ?25 minutes ?Quinn Axe, NP ?Palliative medicine team ?Team phone [phone] ?Greater than 50% of this time was spent counseling and coordinating care related to the above assessment and plan. ?

## 2021-08-05 NOTE — Progress Notes (Signed)
Inpatient Diabetes Program Recommendations ? ?AACE/ADA: New Consensus Statement on Inpatient Glycemic Control (2015) ? ?Target Ranges:  Prepandial:   less than 140 mg/dL ?     Peak postprandial:   less than 180 mg/dL (1-2 hours) ?     Critically ill patients:  140 - 180 mg/dL  ? ?Lab Results  ?Component Value Date  ? GLUCAP 79 08/05/2021  ? HGBA1C 5.7 (H) 05/21/2021  ? ? ?Review of Glycemic Control ? Latest Reference Range & Units 08/04/21 11:07 08/04/21 16:10 08/04/21 21:37 08/05/21 07:37  ?Glucose-Capillary 70 - 99 mg/dL 131 (H) ?Novolog 1 unit 93 107 (H) 79  ?(H): Data is abnormally high ? ?Inpatient Diabetes Program Recommendations:   ?-Decrease Novolog correction to 0-6 units tid ? ?Thank you, ?Nani Gasser Brisa Auth, RN, MSN, CDE  ?Diabetes Coordinator ?Inpatient Glycemic Control Team ?Team Pager [phone] (8am-5pm) ?08/05/2021 8:29 AM ? ? ? ? ?

## 2021-08-05 NOTE — Progress Notes (Signed)
Attempted to call report on patient VM to Nonie Hoyer left to call this writer back. EMS has been called PPW printed ? ?

## 2021-08-07 LAB — CULTURE, BLOOD (ROUTINE X 2)
Culture: NO GROWTH
Culture: NO GROWTH
Special Requests: ADEQUATE

## 2021-08-09 ENCOUNTER — Encounter: Payer: Self-pay | Admitting: Cardiology

## 2021-08-09 ENCOUNTER — Ambulatory Visit (INDEPENDENT_AMBULATORY_CARE_PROVIDER_SITE_OTHER): Payer: 59 | Admitting: Cardiology

## 2021-08-09 ENCOUNTER — Encounter: Payer: Self-pay | Admitting: *Deleted

## 2021-08-09 ENCOUNTER — Ambulatory Visit (INDEPENDENT_AMBULATORY_CARE_PROVIDER_SITE_OTHER): Payer: 59

## 2021-08-09 VITALS — BP 112/66 | HR 125 | Ht 67.0 in

## 2021-08-09 DIAGNOSIS — I5032 Chronic diastolic (congestive) heart failure: Secondary | ICD-10-CM

## 2021-08-09 DIAGNOSIS — I10 Essential (primary) hypertension: Secondary | ICD-10-CM | POA: Diagnosis not present

## 2021-08-09 DIAGNOSIS — I4821 Permanent atrial fibrillation: Secondary | ICD-10-CM

## 2021-08-09 NOTE — Patient Instructions (Signed)
?Testing/Procedures: ? ?ZIO XT- Long Term Monitor Instructions ? ?Your physician has requested you wear a ZIO patch monitor for 3 days.  ?This is a single patch monitor. [employer] supplies one patch monitor per enrollment. Additional ?stickers are not available. Please do not apply patch if you will be having a Nuclear Stress Test,  ?Echocardiogram, Cardiac CT, MRI, or Chest Xray during the period you would be wearing the  ?monitor. The patch cannot be worn during these tests. You cannot remove and re-apply the  ?ZIO XT patch monitor.  ?Your ZIO patch monitor will be mailed 3 day USPS to your address on file. It may take 3-5 days  ?to receive your monitor after you have been enrolled.  ?Once you have received your monitor, please review the enclosed instructions. Your monitor  ?has already been registered assigning a specific monitor serial # to you. ? ?Billing and Patient Assistance Program Information ? ?We have supplied [employer] with any of your insurance information on file for billing purposes. ?[employer] offers a sliding scale Patient Assistance Program for patients that do not have  ?insurance, or whose insurance does not completely cover the cost of the ZIO monitor.  ?You must apply for the Patient Assistance Program to qualify for this discounted rate.  ?To apply, please call [employer] at [phone], select option 4, select option 2, ask to apply for  ?Patient Assistance Program. Theodore Demark will ask your household income, and how many people  ?are in your household. They will quote your out-of-pocket cost based on that information.  ?[employer] will also be able to set up a 75-month interest-free payment plan if needed. ? ?Applying the monitor ?  ?Shave hair from upper left chest.  ?Hold abrader disc by orange tab. Rub abrader in 40 strokes over the upper left chest as  ?indicated in your monitor instructions.  ?Clean area with 4 enclosed alcohol pads. Let dry.  ?Apply patch as indicated in monitor instructions.  Patch will be placed under collarbone on left  ?side of chest with arrow pointing upward.  ?Rub patch adhesive wings for 2 minutes. Remove white label marked "1". Remove the white  ?label marked "2". Rub patch adhesive wings for 2 additional minutes.  ?While looking in a mirror, press and release button in center of patch. A small green light will  ?flash 3-4 times. This will be your only indicator that the monitor has been turned on.  ?Do not shower for the first 24 hours. You may shower after the first 24 hours.  ?Press the button if you feel a symptom. You will hear a small click. Record Date, Time and  ?Symptom in the Patient Logbook.  ?When you are ready to remove the patch, follow instructions on the last 2 pages of Patient  ?Logbook. Stick patch monitor onto the last page of Patient Logbook.  ?Place Patient Logbook in the blue and white box. Use locking tab on box and tape box closed  ?securely. The blue and white box has prepaid postage on it. Please place it in the mailbox as  ?soon as possible. Your physician should have your test results approximately 7 days after the  ?monitor [facility]  ?Call [facility] [phone] you have questions regarding  ?your ZIO XT patch monitor. Call them immediately if you see an orange light blinking on your  ?monitor.  ?If your monitor falls off in less than 4 days, contact our Monitor department at [phone]  ?  If your monitor becomes loose or falls off after 4 days call [employer] at [phone] for  ?suggestions on securing your monitor  ? ? ?Follow-Up: ?At [facility], you and your health needs are our priority.  As part of our continuing mission to provide you with exceptional heart care, we have created designated Provider Care Teams.  These Care Teams include your primary Cardiologist (physician) and Advanced Practice Providers (APPs -  Physician Assistants and Nurse Practitioners) who all work together to  provide you with the care you need, when you need it. ? ?We recommend signing up for the patient portal called "MyChart".  Sign up information is provided on this After Visit Summary.  MyChart is used to connect with patients for Virtual Visits (Telemedicine).  Patients are able to view lab/test results, encounter notes, upcoming appointments, etc.  Non-urgent messages can be sent to your provider as well.   ?To learn more about what you can do with MyChart, go to [email or web address].   ? ?Your next appointment:   ?3 month(s) ? ?The format for your next appointment:   ?In Person ? ?Provider:   ?Coletta Memos, FNP or Sande Rives, PA-C      ? ? ? ?Important Information About Sugar ? ? ? ? ?  ?

## 2021-08-09 NOTE — Progress Notes (Unsigned)
Patient enrolled for [employer] to mail a 3 day ZIO XT long term holter monitor to  ?[facility] in care of [place], [place], [place],   [address]. ? ?Letter with instructions mailed to same address. ?

## 2021-08-16 DIAGNOSIS — I4821 Permanent atrial fibrillation: Secondary | ICD-10-CM | POA: Diagnosis not present

## 2021-08-18 ENCOUNTER — Encounter (HOSPITAL_BASED_OUTPATIENT_CLINIC_OR_DEPARTMENT_OTHER): Payer: 59 | Attending: General Surgery | Admitting: General Surgery

## 2021-08-18 DIAGNOSIS — E1122 Type 2 diabetes mellitus with diabetic chronic kidney disease: Secondary | ICD-10-CM | POA: Insufficient documentation

## 2021-08-18 DIAGNOSIS — E11622 Type 2 diabetes mellitus with other skin ulcer: Secondary | ICD-10-CM | POA: Diagnosis not present

## 2021-08-18 DIAGNOSIS — I87333 Chronic venous hypertension (idiopathic) with ulcer and inflammation of bilateral lower extremity: Secondary | ICD-10-CM | POA: Insufficient documentation

## 2021-08-18 DIAGNOSIS — X58XXXA Exposure to other specified factors, initial encounter: Secondary | ICD-10-CM | POA: Diagnosis not present

## 2021-08-18 DIAGNOSIS — Z7901 Long term (current) use of anticoagulants: Secondary | ICD-10-CM | POA: Diagnosis not present

## 2021-08-18 DIAGNOSIS — S80211A Abrasion, right knee, initial encounter: Secondary | ICD-10-CM | POA: Insufficient documentation

## 2021-08-18 DIAGNOSIS — L89152 Pressure ulcer of sacral region, stage 2: Secondary | ICD-10-CM | POA: Diagnosis not present

## 2021-08-18 DIAGNOSIS — I130 Hypertensive heart and chronic kidney disease with heart failure and stage 1 through stage 4 chronic kidney disease, or unspecified chronic kidney disease: Secondary | ICD-10-CM | POA: Insufficient documentation

## 2021-08-18 DIAGNOSIS — N182 Chronic kidney disease, stage 2 (mild): Secondary | ICD-10-CM | POA: Diagnosis not present

## 2021-08-18 DIAGNOSIS — E11621 Type 2 diabetes mellitus with foot ulcer: Secondary | ICD-10-CM | POA: Diagnosis present

## 2021-08-18 DIAGNOSIS — W19XXXA Unspecified fall, initial encounter: Secondary | ICD-10-CM | POA: Diagnosis not present

## 2021-08-18 DIAGNOSIS — L97526 Non-pressure chronic ulcer of other part of left foot with bone involvement without evidence of necrosis: Secondary | ICD-10-CM | POA: Insufficient documentation

## 2021-08-18 DIAGNOSIS — I5032 Chronic diastolic (congestive) heart failure: Secondary | ICD-10-CM | POA: Diagnosis not present

## 2021-08-18 DIAGNOSIS — E1151 Type 2 diabetes mellitus with diabetic peripheral angiopathy without gangrene: Secondary | ICD-10-CM | POA: Diagnosis not present

## 2021-08-18 DIAGNOSIS — M069 Rheumatoid arthritis, unspecified: Secondary | ICD-10-CM | POA: Insufficient documentation

## 2021-08-18 DIAGNOSIS — Z8673 Personal history of transient ischemic attack (TIA), and cerebral infarction without residual deficits: Secondary | ICD-10-CM | POA: Diagnosis not present

## 2021-08-18 DIAGNOSIS — I482 Chronic atrial fibrillation, unspecified: Secondary | ICD-10-CM | POA: Insufficient documentation

## 2021-08-18 DIAGNOSIS — Z86718 Personal history of other venous thrombosis and embolism: Secondary | ICD-10-CM | POA: Insufficient documentation

## 2021-08-20 NOTE — Progress Notes (Signed)
[name], [name] ([id number]) Visit Report for 08/18/2021 Arrival Information Details Patient Name: Date of Service: [name] 08/18/2021 10:45 A M Medical Record Number: [id number] Patient Account Number: [id number] Date of Birth/Sex: Treating RN: 16-Jan-1946 (76 y.o. [name] Primary Care Marguerette Sheller: Bing Matter Other Clinician: Referring Nyna Chilton: Treating Orvan Papadakis/Extender: Donia Ast in Treatment: 10 Visit Information History Since Last Visit Added or deleted any medications: No Patient Arrived: Wheel Chair Any new allergies or adverse reactions: No Arrival Time: 11:25 Had a fall or experienced change in No Accompanied By: caregiver activities of daily living that may affect Transfer Assistance: Manual risk of falls: Patient Identification Verified: Yes Signs or symptoms of abuse/neglect since last visito No Secondary Verification Process Completed: Yes Hospitalized since last visit: No Patient Has Alerts: Yes Implantable device outside of the clinic excluding No Patient Alerts: Patient on Blood Thinner cellular tissue based products placed in the center R ABI non compressible since last visit: L ABI non compressible Has Dressing in Place as Prescribed: Yes Pain Present Now: No Electronic Signature(s) Signed: 08/19/2021 12:43:38 PM By: Sharyn Creamer RN, BSN Entered By: Sharyn Creamer on 08/18/2021 11:26:35 -------------------------------------------------------------------------------- Encounter Discharge Information Details Patient Name: Date of Service: Christina Asa. 08/18/2021 10:45 A M Medical Record Number: [id number] Patient Account Number: [id number] Date of Birth/Sex: Treating RN: [birth year] (76 y.o. [name] Primary Care Xzaiver Vayda: Bing Matter Other Clinician: Referring Adessa Primiano: Treating Zyier Dykema/Extender: Donia Ast in Treatment: 10 Encounter Discharge Information Items  Post Procedure Vitals Discharge Condition: Stable Temperature (F): 97.7 Ambulatory Status: Wheelchair Pulse (bpm): 134 Discharge Destination: [place] Respiratory Rate (breaths/min): 20 Telephoned: No Blood Pressure (mmHg): 129/84 Orders Sent: Yes Transportation: Private Auto Accompanied By: caregiver Schedule Follow-up Appointment: Yes Clinical Summary of Care: Patient Declined Electronic Signature(s) Signed: 08/19/2021 12:43:38 PM By: Sharyn Creamer RN, BSN Entered By: Sharyn Creamer on 08/18/2021 17:19:00 -------------------------------------------------------------------------------- Lower Extremity Assessment Details Patient Name: Date of Service: Christina Asa. 08/18/2021 10:45 A M Medical Record Number: [id number] Patient Account Number: [id number] Date of Birth/Sex: Treating RN: January 14, 1946 (76 y.o. [name] Primary Care Rena Sweeden: Bing Matter Other Clinician: Referring Murlene Revell: Treating Jurgen Groeneveld/Extender: Willodean Rosenthal Weeks in Treatment: 10 Edema Assessment Assessed: Shirlyn Goltz: No] [Right: No] E[Left: dema] [Right: :] Calf Left: Right: Point of Measurement: 28 cm From Medial Instep 23.5 cm 23.8 cm Ankle Left: Right: Point of Measurement: 11 cm From Medial Instep 17.7 cm 18.2 cm Vascular Assessment Pulses: Dorsalis Pedis Palpable: [Left:Yes] [Right:Yes] Electronic Signature(s) Signed: 08/19/2021 12:43:38 PM By: Sharyn Creamer RN, BSN Entered By: Sharyn Creamer on 08/18/2021 11:29:56 -------------------------------------------------------------------------------- Multi Wound Chart Details Patient Name: Date of Service: Christina Asa. 08/18/2021 10:45 A M Medical Record Number: [id number] Patient Account Number: [id number] Date of Birth/Sex: Treating RN: 1945/06/16 (76 y.o. [name] Primary Care Maneh Sieben: Bing Matter Other Clinician: Referring Morganne Haile: Treating Aleksander Edmiston/Extender: Donia Ast in Treatment: 10 Vital Signs Height(in): 32 Pulse(bpm): 134 Weight(lbs): 183 Blood Pressure(mmHg): 129/84 Body Mass Index(BMI): 28.7 Temperature(F): 97.7 Respiratory Rate(breaths/min): 20 Photos: [1:No Photos Right, Anterior Lower Leg] [10:Right, Medial T Great oe] [11:Right, Lateral Ankle] Wound Location: [1:Gradually Appeared] [10:Gradually Appeared] [11:Shear/Friction] Wounding Event: [1:Diabetic Wound/Ulcer of the Lower] [10:Diabetic Wound/Ulcer of the Lower] [11:Diabetic Wound/Ulcer of the Lower] Primary Etiology: [1:Extremity Chronic sinus problems/congestion,] [10:Extremity Chronic sinus problems/congestion, Chronic sinus problems/congestion,] [11:Extremity] Comorbid History: [1:Anemia, Chronic Obstructive Pulmonary Disease (COPD), Congestive Heart Failure, Hypertension, Peripheral Venous Disease, Type II Diabetes,  Dementia, Neuropathy 05/05/2021] [10:Anemia, Chronic Obstructive Pulmonary Disease (COPD),  Congestive Heart Failure, Hypertension, Peripheral Venous Disease, Type II Diabetes, Dementia, Disease, Type II Diabetes, Dementia, Neuropathy 08/18/2021] [11:Anemia, Chronic Obstructive Pulmonary Disease (COPD), Congestive Heart Failure, Hypertension,  Peripheral Venous Neuropathy 08/18/2021] Date Acquired: [1:10] [10:0] [11:0] Weeks of Treatment: [1:Open] [10:Open] [11:Open] Wound Status: [1:No] [10:No] [11:No] Wound Recurrence: [1:Yes] [10:No] [11:Yes] Clustered Wound: [1:N/A] [10:N/A] [11:5] Clustered Quantity: [1:0x0x0] [10:0.2x0.3x0.1] [11:8.3x1x0.1] Measurements L x W x D (cm) [1:0] [10:0.047] [11:6.519] A (cm) : rea [1:0] [10:0.005] [11:0.652] Volume (cm) : [1:100.00%] [10:0.00%] [11:0.00%] % Reduction in A [1:rea: 100.00%] [10:0.00%] [11:0.00%] % Reduction in Volume: [1:Grade 0] [10:Grade 2] [11:Grade 2] Classification: [1:None Present] [10:Medium] [11:Medium] Exudate A mount: [1:N/A] [10:Serosanguineous] [11:Serosanguineous] Exudate  Type: [1:N/A] [10:red, brown] [11:red, brown] Exudate Color: [1:N/A] [10:Distinct, outline attached] [11:Distinct, outline attached] Wound Margin: [1:None Present (0%)] [10:Small (1-33%)] [11:Small (1-33%)] Granulation A mount: [1:N/A] [10:Red] [11:Red] Granulation Quality: [1:None Present (0%)] [10:Large (67-100%)] [11:Large (67-100%)] Necrotic A mount: [1:N/A] [10:Adherent Slough] [11:Eschar, Adherent Slough] Necrotic Tissue: [1:Fascia: No] [10:Fat Layer (Subcutaneous Tissue): Yes Fat Layer (Subcutaneous Tissue): Yes] Exposed Structures: [1:Fat Layer (Subcutaneous Tissue): No Tendon: No Muscle: No Joint: No Bone: No Large (67-100%)] [10:Fascia: No Tendon: No Muscle: No Joint: No Bone: No None] [11:Fascia: No Tendon: No Muscle: No Joint: No Bone: No None] Epithelialization: [1:N/A] [10:Debridement - Selective/Open Wound Debridement - Selective/Open Wound] Debridement: Pre-procedure Verification/Time Out N/A [10:11:52] [11:11:52] Taken: [1:N/A] [10:Other] [11:Other] Pain Control: [1:N/A] [10:Slough] [11:Necrotic/Eschar, Slough] Tissue Debrided: [1:N/A] [10:Non-Viable Tissue] [11:Non-Viable Tissue] Level: [1:N/A] [10:0.06] [11:2] Debridement A (sq cm): [1:rea N/A] [10:Curette] [11:Curette] Instrument: [1:N/A] [10:Minimum] [11:Minimum] Bleeding: [1:N/A] [10:Pressure] [11:Pressure] Hemostasis A chieved: [1:N/A] [10:3] [11:3] Procedural Pain: [1:N/A] [10:2] [11:2] Post Procedural Pain: [1:N/A] [10:Procedure was tolerated well] [11:Procedure was tolerated well] Debridement Treatment Response: [1:N/A] [10:0.2x0.3x0.1] [11:3.5x1x0.1] Post Debridement Measurements L x W x D (cm) [1:N/A] [10:0.005] [11:0.275] Post Debridement Volume: (cm) [1:N/A] [10:Debridement] [11:Debridement] Wound Number: '2 3 4 '$ Photos: No Photos No Photos Right, Posterior Lower Leg Left, Anterior Lower Leg Left, Medial Lower Leg Wound Location: Gradually Appeared Gradually Appeared Gradually Appeared Wounding  Event: Diabetic Wound/Ulcer of the Lower Diabetic Wound/Ulcer of the Lower Diabetic Wound/Ulcer of the Lower Primary Etiology: Extremity Extremity Extremity Chronic sinus problems/congestion, Chronic sinus problems/congestion, Chronic sinus problems/congestion, Comorbid History: Anemia, Chronic Obstructive Anemia, Chronic Obstructive Anemia, Chronic Obstructive Pulmonary Disease (COPD), Pulmonary Disease (COPD), Pulmonary Disease (COPD), Congestive Heart Failure, Congestive Heart Failure, Congestive Heart Failure, Hypertension, Peripheral Venous Hypertension, Peripheral Venous Hypertension, Peripheral Venous Disease, Type II Diabetes, Dementia, Disease, Type II Diabetes, Dementia, Disease, Type II Diabetes, Dementia, Neuropathy Neuropathy Neuropathy 05/06/2021 05/05/2021 05/05/2021 Date Acquired: '10 10 10 '$ Weeks of Treatment: Open Open Open Wound Status: No No No Wound Recurrence: Yes Yes Yes Clustered Wound: N/A N/A N/A Clustered Quantity: 0x0x0 0.4x0.4x0.1 0x0x0 Measurements L x W x D (cm) 0 0.126 0 A (cm) : rea 0 0.013 0 Volume (cm) : 100.00% 35.70% 100.00% % Reduction in A rea: 100.00% 35.00% 100.00% % Reduction in Volume: Grade 0 Grade 2 Grade 2 Classification: None Present Medium None Present Exudate A mount: N/A Serosanguineous N/A Exudate Type: N/A red, brown N/A Exudate Color: N/A Flat and Intact N/A Wound Margin: None Present (0%) Large (67-100%) None Present (0%) Granulation A mount: N/A Red N/A Granulation Quality: None Present (0%) None Present (0%) None Present (0%) Necrotic A mount: N/A N/A N/A Necrotic Tissue: Fascia: No Fat Layer (Subcutaneous Tissue): Yes Fascia: No Exposed Structures: Fat Layer (Subcutaneous Tissue): No  Fascia: No Fat Layer (Subcutaneous Tissue): No Tendon: No Tendon: No Tendon: No Muscle: No Muscle: No Muscle: No Joint: No Joint: No Joint: No Bone: No Bone: No Bone: No None Small (1-33%) Large  (67-100%) Epithelialization: N/A Debridement - Selective/Open Wound N/A Debridement: Pre-procedure Verification/Time Out N/A 11:52 N/A Taken: N/A Other N/A Pain Control: N/A Slough N/A Tissue Debrided: N/A Non-Viable Tissue N/A Level: N/A 0.16 N/A Debridement A (sq cm): rea N/A Curette N/A Instrument: N/A Minimum N/A Bleeding: N/A Pressure N/A Hemostasis A chieved: N/A 3 N/A Procedural Pain: N/A 2 N/A Post Procedural Pain: N/A Procedure was tolerated well N/A Debridement Treatment Response: N/A 0.4x0.4x0.1 N/A Post Debridement Measurements L x W x D (cm) N/A 0.013 N/A Post Debridement Volume: (cm) N/A Debridement N/A Procedures Performed: Wound Number: '5 6 7 '$ Photos: No Photos No Photos No Photos Left T Third oe Left Metatarsal head first Left T Fourth oe Wound Location: Gradually Appeared Gradually Appeared Gradually Appeared Wounding Event: Diabetic Wound/Ulcer of the Lower Diabetic Wound/Ulcer of the Lower Diabetic Wound/Ulcer of the Lower Primary Etiology: Extremity Extremity Extremity Chronic sinus problems/congestion, Chronic sinus problems/congestion, Chronic sinus problems/congestion, Comorbid History: Anemia, Chronic Obstructive Anemia, Chronic Obstructive Anemia, Chronic Obstructive Pulmonary Disease (COPD), Pulmonary Disease (COPD), Pulmonary Disease (COPD), Congestive Heart Failure, Congestive Heart Failure, Congestive Heart Failure, Hypertension, Peripheral Venous Hypertension, Peripheral Venous Hypertension, Peripheral Venous Disease, Type II Diabetes, Dementia, Disease, Type II Diabetes, Dementia, Disease, Type II Diabetes, Dementia, Neuropathy Neuropathy Neuropathy 05/05/2021 05/05/2021 05/05/2021 Date Acquired: '10 10 10 '$ Weeks of Treatment: Open Open Open Wound Status: No No No Wound Recurrence: No No No Clustered Wound: N/A N/A N/A Clustered Quantity: 0x0x0 0x0x0 0x0x0 Measurements L x W x D (cm) 0 0 0 A (cm) : rea 0 0 0 Volume (cm)  : 100.00% 100.00% 100.00% % Reduction in A rea: 100.00% 100.00% 100.00% % Reduction in Volume: Grade 2 Grade 2 Grade 2 Classification: Medium Medium None Present Exudate A mount: Serosanguineous Serosanguineous N/A Exudate Type: red, brown red, brown N/A Exudate Color: Flat and Intact Flat and Intact Flat and Intact Wound Margin: None Present (0%) None Present (0%) None Present (0%) Granulation A mount: N/A N/A N/A Granulation Quality: None Present (0%) None Present (0%) None Present (0%) Necrotic A mount: N/A N/A N/A Necrotic Tissue: Bone: Yes Fascia: No Fascia: No Exposed Structures: Fascia: No Fat Layer (Subcutaneous Tissue): No Fat Layer (Subcutaneous Tissue): No Fat Layer (Subcutaneous Tissue): No Tendon: No Tendon: No Tendon: No Muscle: No Muscle: No Muscle: No Joint: No Joint: No Joint: No Bone: No Bone: No None Large (67-100%) Large (67-100%) Epithelialization: N/A N/A N/A Debridement: N/A N/A N/A Pain Control: N/A N/A N/A Tissue Debrided: N/A N/A N/A Level: N/A N/A N/A Debridement A (sq cm): rea N/A N/A N/A Instrument: N/A N/A N/A Bleeding: N/A N/A N/A Hemostasis A chieved: N/A N/A N/A Procedural Pain: N/A N/A N/A Post Procedural Pain: N/A N/A N/A Debridement Treatment Response: N/A N/A N/A Post Debridement Measurements L x W x D (cm) N/A N/A N/A Post Debridement Volume: (cm) N/A N/A N/A Procedures Performed: Wound Number: [id number] 9 N/A Photos: N/A Left Calcaneus Right, Lateral Knee N/A Wound Location: Pressure Injury Shear/Friction N/A Wounding Event: Diabetic Wound/Ulcer of the Lower Abrasion N/A Primary Etiology: Extremity Chronic sinus problems/congestion, Chronic sinus problems/congestion, N/A Comorbid History: Anemia, Chronic Obstructive Anemia, Chronic Obstructive Pulmonary Disease (COPD), Pulmonary Disease (COPD), Congestive Heart Failure, Congestive Heart Failure, Hypertension, Peripheral Venous Hypertension,  Peripheral Venous Disease, Type II Diabetes, Dementia,Disease, Type II Diabetes,  Dementia, Neuropathy Neuropathy 06/09/2021 08/18/2021 N/A Date Acquired: 10 0 N/A Weeks of Treatment: Open Open N/A Wound Status: No No N/A Wound Recurrence: No No N/A Clustered Wound: N/A N/A N/A Clustered Quantity: 0.4x0.3x0.1 1.7x0.6x0.1 N/A Measurements L x W x D (cm) 0.094 0.801 N/A A (cm) : rea 0.009 0.08 N/A Volume (cm) : 98.40% 0.00% N/A % Reduction in A rea: 98.50% 0.00% N/A % Reduction in Volume: Grade 2 Full Thickness Without Exposed N/A Classification: Support Structures Medium None Present N/A Exudate A mount: Serosanguineous N/A N/A Exudate Type: red, brown N/A N/A Exudate Color: Flat and Intact Distinct, outline attached N/A Wound Margin: None Present (0%) None Present (0%) N/A Granulation A mount: N/A N/A N/A Granulation Quality: Large (67-100%) Large (67-100%) N/A Necrotic A mount: Adherent Slough Eschar N/A Necrotic Tissue: Fat Layer (Subcutaneous Tissue): Yes Fat Layer (Subcutaneous Tissue): Yes N/A Exposed Structures: Fascia: No Fascia: No Tendon: No Tendon: No Muscle: No Muscle: No Joint: No Joint: No Bone: No Bone: No Large (67-100%) Small (1-33%) N/A Epithelialization: N/A Debridement - Selective/Open Wound N/A Debridement: Pre-procedure Verification/Time Out N/A 11:52 N/A Taken: N/A Other N/A Pain Control: N/A Necrotic/Eschar, Slough N/A Tissue Debrided: N/A Non-Viable Tissue N/A Level: N/A 1.36 N/A Debridement A (sq cm): rea N/A Curette N/A Instrument: N/A Minimum N/A Bleeding: N/A Pressure N/A Hemostasis A chieved: N/A 3 N/A Procedural Pain: N/A 2 N/A Post Procedural Pain: N/A Procedure was tolerated well N/A Debridement Treatment Response: N/A 1.7x0.8x0.1 N/A Post Debridement Measurements L x W x D (cm) N/A 0.107 N/A Post Debridement Volume: (cm) N/A Debridement N/A Procedures Performed: Treatment Notes Electronic  Signature(s) Signed: 08/18/2021 12:03:50 PM By: Fredirick Maudlin MD FACS Signed: 08/19/2021 5:25:10 PM By: Levan Hurst RN, BSN Entered By: Fredirick Maudlin on 08/18/2021 12:03:50 -------------------------------------------------------------------------------- Multi-Disciplinary Care Plan Details Patient Name: Date of Service: Christina Asa. 08/18/2021 10:45 A M Medical Record Number: [id number] Patient Account Number: [id number] Date of Birth/Sex: Treating RN: Jan 26, 1946 (76 y.o. [name] Primary Care Travus Oren: Bing Matter Other Clinician: Referring Malicia Blasdel: Treating Craven Crean/Extender: Donia Ast in Treatment: 10 Multidisciplinary Care Plan reviewed with physician Active Inactive Abuse / Safety / Falls / Self Care Management Nursing Diagnoses: Potential for falls Potential for injury related to falls Goals: Patient will not experience any injury related to falls Date Initiated: 06/09/2021 Target Resolution Date: 09/15/2021 Goal Status: Active Patient/caregiver will verbalize/demonstrate measures taken to prevent injury and/or falls Date Initiated: 06/09/2021 Target Resolution Date: 09/15/2021 Goal Status: Active Interventions: Assess Activities of Daily Living upon admission and as needed Assess fall risk on admission and as needed Assess: immobility, friction, shearing, incontinence upon admission and as needed Assess impairment of mobility on admission and as needed per policy Assess personal safety and home safety (as indicated) on admission and as needed Assess self care needs on admission and as needed Provide education on fall prevention Provide education on personal and home safety Notes: Nutrition Nursing Diagnoses: Potential for alteratiion in Nutrition/Potential for imbalanced nutrition Goals: Patient/caregiver agrees to and verbalizes understanding of need to use nutritional supplements and/or vitamins as prescribed Date  Initiated: 06/09/2021 Target Resolution Date: 09/15/2021 Goal Status: Active Patient/caregiver will maintain therapeutic glucose control Date Initiated: 06/09/2021 Target Resolution Date: 09/15/2021 Goal Status: Active Interventions: Assess HgA1c results as ordered upon admission and as needed Assess patient nutrition upon admission and as needed per policy Provide education on elevated blood sugars and impact on wound healing Provide education on nutrition Treatment Activities: Education provided on Nutrition :  06/09/2021 Notes: Wound/Skin Impairment Nursing Diagnoses: Impaired tissue integrity Knowledge deficit related to ulceration/compromised skin integrity Goals: Patient/caregiver will verbalize understanding of skin care regimen Date Initiated: 06/09/2021 Target Resolution Date: 09/15/2021 Goal Status: Active Interventions: Assess patient/caregiver ability to obtain necessary supplies Assess patient/caregiver ability to perform ulcer/skin care regimen upon admission and as needed Assess ulceration(s) every visit Provide education on ulcer and skin care Notes: Electronic Signature(s) Signed: 08/19/2021 12:43:38 PM By: Sharyn Creamer RN, BSN Entered By: Sharyn Creamer on 08/18/2021 17:15:36 -------------------------------------------------------------------------------- Pain Assessment Details Patient Name: Date of Service: Christina Asa. 08/18/2021 10:45 A M Medical Record Number: [id number] Patient Account Number: [id number] Date of Birth/Sex: Treating RN: [birth year] (76 y.o. [name] Primary Care Leon Montoya: Bing Matter Other Clinician: Referring Sinthia Karabin: Treating Teauna Dubach/Extender: Donia Ast in Treatment: 10 Active Problems Location of Pain Severity and Description of Pain Patient Has Paino No Site Locations Pain Management and Medication Current Pain Management: Electronic Signature(s) Signed: 08/19/2021 12:43:38 PM By:  Sharyn Creamer RN, BSN Entered By: Sharyn Creamer on 08/18/2021 11:28:04 -------------------------------------------------------------------------------- Patient/Caregiver Education Details Patient Name: Date of Service: Howze, [name] 5/17/2023andnbsp10:45 A M Medical Record Number: [id number] Patient Account Number: [id number] Date of Birth/Gender: Treating RN: 22-Dec-1945 (76 y.o. [name] Primary Care Physician: Bing Matter Other Clinician: Referring Physician: Treating Physician/Extender: Donia Ast in Treatment: 10 Education Assessment Education Provided To: Patient Education Topics Provided Safety: Methods: Explain/Verbal Responses: State content correctly Wound/Skin Impairment: Methods: Explain/Verbal Responses: State content correctly Electronic Signature(s) Signed: 08/19/2021 12:43:38 PM By: Sharyn Creamer RN, BSN Entered By: Sharyn Creamer on 08/18/2021 17:16:02 -------------------------------------------------------------------------------- Wound Assessment Details Patient Name: Date of Service: Christina Asa. 08/18/2021 10:45 A M Medical Record Number: [id number] Patient Account Number: [id number] Date of Birth/Sex: Treating RN: 06-15-1945 (76 y.o. [name] Primary Care Faheem Ziemann: Bing Matter Other Clinician: Referring Ailana Cuadrado: Treating Cristabel Bicknell/Extender: Donia Ast in Treatment: 10 Wound Status Wound Number: 1 Primary Diabetic Wound/Ulcer of the Lower Extremity Etiology: Wound Location: Right, Anterior Lower Leg Wound Open Wounding Event: Gradually Appeared Status: Date Acquired: 05/05/2021 Comorbid Chronic sinus problems/congestion, Anemia, Chronic Obstructive Weeks Of Treatment: 10 History: Pulmonary Disease (COPD), Congestive Heart Failure, Clustered Wound: Yes Hypertension, Peripheral Venous Disease, Type II Diabetes, Dementia, Neuropathy Wound  Measurements Length: (cm) Width: (cm) Depth: (cm) Area: (cm) Volume: (cm) 0 % Reduction in Area: 100% 0 % Reduction in Volume: 100% 0 Epithelialization: Large (67-100%) 0 Tunneling: No 0 Undermining: No Wound Description Classification: Grade 0 Exudate Amount: None Present Foul Odor After Cleansing: No Slough/Fibrino No Wound Bed Granulation Amount: None Present (0%) Exposed Structure Necrotic Amount: None Present (0%) Fascia Exposed: No Fat Layer (Subcutaneous Tissue) Exposed: No Tendon Exposed: No Muscle Exposed: No Joint Exposed: No Bone Exposed: No Electronic Signature(s) Signed: 08/19/2021 12:43:38 PM By: Sharyn Creamer RN, BSN Entered By: Sharyn Creamer on 08/18/2021 11:16:43 -------------------------------------------------------------------------------- Wound Assessment Details Patient Name: Date of Service: Christina Asa. 08/18/2021 10:45 A M Medical Record Number: [id number] Patient Account Number: [id number] Date of Birth/Sex: Treating RN: 1946/02/05 (76 y.o. [name] Primary Care Kristen Bushway: Bing Matter Other Clinician: Referring Ninfa Giannelli: Treating Coty Student/Extender: Willodean Rosenthal Weeks in Treatment: 10 Wound Status Wound Number: 10 Primary Diabetic Wound/Ulcer of the Lower Extremity Etiology: Wound Location: Right, Medial T Great oe Wound Open Wounding Event: Gradually Appeared Status: Date Acquired: 08/18/2021 Comorbid Chronic sinus problems/congestion, Anemia, Chronic Obstructive Weeks Of Treatment: 0 History: Pulmonary Disease (COPD), Congestive Heart Failure,  Clustered Wound: No Hypertension, Peripheral Venous Disease, Type II Diabetes, Dementia, Neuropathy Photos Wound Measurements Length: (cm) 0.2 Width: (cm) 0.3 Depth: (cm) 0.1 Area: (cm) 0.047 Volume: (cm) 0.005 % Reduction in Area: 0% % Reduction in Volume: 0% Epithelialization: None Wound Description Classification: Grade 2 Wound Margin:  Distinct, outline attached Exudate Amount: Medium Exudate Type: Serosanguineous Exudate Color: red, brown Foul Odor After Cleansing: No Slough/Fibrino Yes Wound Bed Granulation Amount: Small (1-33%) Exposed Structure Granulation Quality: Red Fascia Exposed: No Necrotic Amount: Large (67-100%) Fat Layer (Subcutaneous Tissue) Exposed: Yes Necrotic Quality: Adherent Slough Tendon Exposed: No Muscle Exposed: No Joint Exposed: No Bone Exposed: No Treatment Notes Wound #10 (Toe Great) Wound Laterality: Right, Medial Cleanser Soap and Water Discharge Instruction: May shower and wash wound with dial antibacterial soap and water prior to dressing change. Wound Cleanser Discharge Instruction: Cleanse the wound with wound cleanser prior to applying a clean dressing using gauze sponges, not tissue or cotton balls. Peri-Wound Care Sween Lotion (Moisturizing lotion) Discharge Instruction: Apply moisturizing lotion as directed Topical Primary Dressing KerraCel Ag Gelling Fiber Dressing, 2x2 in (silver alginate) Discharge Instruction: Apply silver alginate to wound bed as instructed Secondary Dressing Woven Gauze Sponge, Non-Sterile 4x4 in Discharge Instruction: Apply over primary dressing as directed. Secured With Compression Wrap Kerlix Roll 4.5x3.1 (in/yd) Discharge Instruction: Apply Kerlix and Coban compression as directed. Coban Self-Adherent Wrap 4x5 (in/yd) Discharge Instruction: Apply over Kerlix as directed. Compression Stockings Add-Ons Electronic Signature(s) Signed: 08/19/2021 12:43:38 PM By: Sharyn Creamer RN, BSN Entered By: Sharyn Creamer on 08/18/2021 11:42:49 -------------------------------------------------------------------------------- Wound Assessment Details Patient Name: Date of Service: Christina Asa. 08/18/2021 10:45 A M Medical Record Number: [id number] Patient Account Number: [id number] Date of Birth/Sex: Treating RN: Feb 27, 1946 (76 y.o. [name] Primary Care Anjel Perfetti: Bing Matter Other Clinician: Referring Kerrigan Glendening: Treating Teniqua Marron/Extender: Willodean Rosenthal Weeks in Treatment: 10 Wound Status Wound Number: 11 Primary Diabetic Wound/Ulcer of the Lower Extremity Etiology: Wound Location: Right, Lateral Ankle Wound Open Wounding Event: Shear/Friction Status: Date Acquired: 08/18/2021 Comorbid Chronic sinus problems/congestion, Anemia, Chronic Obstructive Weeks Of Treatment: 0 History: Pulmonary Disease (COPD), Congestive Heart Failure, Clustered Wound: Yes Hypertension, Peripheral Venous Disease, Type II Diabetes, Dementia, Neuropathy Photos Wound Measurements Length: (cm) 8.3 Width: (cm) 1 Depth: (cm) 0.1 Clustered Quantity: 5 Area: (cm) 6.519 Volume: (cm) 0.652 % Reduction in Area: 0% % Reduction in Volume: 0% Epithelialization: None Tunneling: No Undermining: No Wound Description Classification: Grade 2 Wound Margin: Distinct, outline attached Exudate Amount: Medium Exudate Type: Serosanguineous Exudate Color: red, brown Foul Odor After Cleansing: No Slough/Fibrino Yes Wound Bed Granulation Amount: Small (1-33%) Exposed Structure Granulation Quality: Red Fascia Exposed: No Necrotic Amount: Large (67-100%) Fat Layer (Subcutaneous Tissue) Exposed: Yes Necrotic Quality: Eschar, Adherent Slough Tendon Exposed: No Muscle Exposed: No Joint Exposed: No Bone Exposed: No Treatment Notes Wound #11 (Ankle) Wound Laterality: Right, Lateral Cleanser Soap and Water Discharge Instruction: May shower and wash wound with dial antibacterial soap and water prior to dressing change. Wound Cleanser Discharge Instruction: Cleanse the wound with wound cleanser prior to applying a clean dressing using gauze sponges, not tissue or cotton balls. Peri-Wound Care Sween Lotion (Moisturizing lotion) Discharge Instruction: Apply moisturizing lotion as directed Topical Primary Dressing KerraCel  Ag Gelling Fiber Dressing, 2x2 in (silver alginate) Discharge Instruction: Apply silver alginate to wound bed as instructed Secondary Dressing Woven Gauze Sponge, Non-Sterile 4x4 in Discharge Instruction: Apply over primary dressing as directed. Secured With Compression Wrap Kerlix Roll 4.5x3.1 (in/yd) Discharge Instruction:  Apply Kerlix and Coban compression as directed. Coban Self-Adherent Wrap 4x5 (in/yd) Discharge Instruction: Apply over Kerlix as directed. Compression Stockings Add-Ons Electronic Signature(s) Signed: 08/19/2021 12:43:38 PM By: Sharyn Creamer RN, BSN Entered By: Sharyn Creamer on 08/18/2021 11:43:32 -------------------------------------------------------------------------------- Wound Assessment Details Patient Name: Date of Service: Christina Asa. 08/18/2021 10:45 A M Medical Record Number: [id number] Patient Account Number: [id number] Date of Birth/Sex: Treating RN: [birth year] (76 y.o. [name] Primary Care Terrin Meddaugh: Bing Matter Other Clinician: Referring Keita Demarco: Treating Rajesh Wyss/Extender: Donia Ast in Treatment: 10 Wound Status Wound Number: 2 Primary Diabetic Wound/Ulcer of the Lower Extremity Etiology: Wound Location: Right, Posterior Lower Leg Wound Open Wounding Event: Gradually Appeared Status: Date Acquired: 05/06/2021 Comorbid Chronic sinus problems/congestion, Anemia, Chronic Obstructive Weeks Of Treatment: 10 History: Pulmonary Disease (COPD), Congestive Heart Failure, Clustered Wound: Yes Hypertension, Peripheral Venous Disease, Type II Diabetes, Dementia, Neuropathy Wound Measurements Length: (cm) Width: (cm) Depth: (cm) Area: (cm) Volume: (cm) 0 % Reduction in Area: 100% 0 % Reduction in Volume: 100% 0 Epithelialization: None 0 Tunneling: No 0 Undermining: No Wound Description Classification: Grade 0 Exudate Amount: None Present Foul Odor After Cleansing: No Slough/Fibrino  No Wound Bed Granulation Amount: None Present (0%) Exposed Structure Necrotic Amount: None Present (0%) Fascia Exposed: No Fat Layer (Subcutaneous Tissue) Exposed: No Tendon Exposed: No Muscle Exposed: No Joint Exposed: No Bone Exposed: No Electronic Signature(s) Signed: 08/19/2021 12:43:38 PM By: Sharyn Creamer RN, BSN Entered By: Sharyn Creamer on 08/18/2021 11:17:57 -------------------------------------------------------------------------------- Wound Assessment Details Patient Name: Date of Service: Christina Asa. 08/18/2021 10:45 A M Medical Record Number: [id number] Patient Account Number: [id number] Date of Birth/Sex: Treating RN: [birth year] (76 y.o. [name] Primary Care Hussam Muniz: Bing Matter Other Clinician: Referring Jaysun Wessels: Treating Aitana Burry/Extender: Willodean Rosenthal Weeks in Treatment: 10 Wound Status Wound Number: 3 Primary Diabetic Wound/Ulcer of the Lower Extremity Etiology: Etiology: Wound Location: Left, Anterior Lower Leg Wound Open Wounding Event: Gradually Appeared Status: Date Acquired: 05/05/2021 Comorbid Chronic sinus problems/congestion, Anemia, Chronic Obstructive Weeks Of Treatment: 10 History: Pulmonary Disease (COPD), Congestive Heart Failure, Clustered Wound: Yes Hypertension, Peripheral Venous Disease, Type II Diabetes, Dementia, Neuropathy Photos Wound Measurements Length: (cm) 0.4 Width: (cm) 0.4 Depth: (cm) 0.1 Area: (cm) 0.126 Volume: (cm) 0.013 % Reduction in Area: 35.7% % Reduction in Volume: 35% Epithelialization: Small (1-33%) Tunneling: No Undermining: No Wound Description Classification: Grade 2 Wound Margin: Flat and Intact Exudate Amount: Medium Exudate Type: Serosanguineous Exudate Color: red, brown Foul Odor After Cleansing: No Slough/Fibrino Yes Wound Bed Granulation Amount: Large (67-100%) Exposed Structure Granulation Quality: Red Fascia Exposed: No Necrotic Amount: None  Present (0%) Fat Layer (Subcutaneous Tissue) Exposed: Yes Tendon Exposed: No Muscle Exposed: No Joint Exposed: No Bone Exposed: No Treatment Notes Wound #3 (Lower Leg) Wound Laterality: Left, Anterior Cleanser Soap and Water Discharge Instruction: May shower and wash wound with dial antibacterial soap and water prior to dressing change. Wound Cleanser Discharge Instruction: Cleanse the wound with wound cleanser prior to applying a clean dressing using gauze sponges, not tissue or cotton balls. Peri-Wound Care Sween Lotion (Moisturizing lotion) Discharge Instruction: Apply moisturizing lotion as directed Topical Primary Dressing KerraCel Ag Gelling Fiber Dressing, 2x2 in (silver alginate) Discharge Instruction: Apply silver alginate to wound bed as instructed Secondary Dressing Woven Gauze Sponge, Non-Sterile 4x4 in Discharge Instruction: Apply over primary dressing as directed. Secured With Compression Wrap Kerlix Roll 4.5x3.1 (in/yd) Discharge Instruction: Apply Kerlix and Coban compression as directed. Coban Self-Adherent Wrap 4x5 (in/yd)  Discharge Instruction: Apply over Kerlix as directed. Compression Stockings Add-Ons Electronic Signature(s) Signed: 08/19/2021 12:43:38 PM By: Sharyn Creamer RN, BSN Entered By: Sharyn Creamer on 08/18/2021 11:43:58 -------------------------------------------------------------------------------- Wound Assessment Details Patient Name: Date of Service: Christina Asa. 08/18/2021 10:45 A M Medical Record Number: [id number] Patient Account Number: [id number] Date of Birth/Sex: Treating RN: December 21, 1945 (76 y.o. [name] Primary Care Kelsa Jaworowski: Bing Matter Other Clinician: Referring Westley Blass: Treating Lauralyn Shadowens/Extender: Willodean Rosenthal Weeks in Treatment: 10 Wound Status Wound Number: 4 Primary Diabetic Wound/Ulcer of the Lower Extremity Etiology: Wound Location: Left, Medial Lower Leg Wound Open Wounding  Event: Gradually Appeared Status: Date Acquired: 05/05/2021 Comorbid Chronic sinus problems/congestion, Anemia, Chronic Obstructive Weeks Of Treatment: 10 History: Pulmonary Disease (COPD), Congestive Heart Failure, Clustered Wound: Yes Hypertension, Peripheral Venous Disease, Type II Diabetes, Dementia, Neuropathy Wound Measurements Length: (cm) Width: (cm) Depth: (cm) Area: (cm) Volume: (cm) 0 % Reduction in Area: 100% 0 % Reduction in Volume: 100% 0 Epithelialization: Large (67-100%) 0 Tunneling: No 0 Undermining: No Wound Description Classification: Grade 2 Exudate Amount: None Present Foul Odor After Cleansing: No Slough/Fibrino No Wound Bed Granulation Amount: None Present (0%) Exposed Structure Necrotic Amount: None Present (0%) Fascia Exposed: No Fat Layer (Subcutaneous Tissue) Exposed: No Tendon Exposed: No Muscle Exposed: No Joint Exposed: No Bone Exposed: No Electronic Signature(s) Signed: 08/19/2021 12:43:38 PM By: Sharyn Creamer RN, BSN Entered By: Sharyn Creamer on 08/18/2021 11:22:35 -------------------------------------------------------------------------------- Wound Assessment Details Patient Name: Date of Service: Christina Asa. 08/18/2021 10:45 A M Medical Record Number: [id number] Patient Account Number: [id number] Date of Birth/Sex: Treating RN: 04-May-1945 (76 y.o. [name] Primary Care Melvia Matousek: Other Clinician: Bing Matter Referring Londen Lorge: Treating Brynlynn Walko/Extender: Donia Ast in Treatment: 10 Wound Status Wound Number: 5 Primary Diabetic Wound/Ulcer of the Lower Extremity Etiology: Wound Location: Left T Third oe Wound Open Wounding Event: Gradually Appeared Status: Date Acquired: 05/05/2021 Comorbid Chronic sinus problems/congestion, Anemia, Chronic Obstructive Weeks Of Treatment: 10 History: Pulmonary Disease (COPD), Congestive Heart Failure, Clustered Wound: No Hypertension,  Peripheral Venous Disease, Type II Diabetes, Dementia, Neuropathy Wound Measurements Length: (cm) Width: (cm) Depth: (cm) Area: (cm) Volume: (cm) 0 % Reduction in Area: 100% 0 % Reduction in Volume: 100% 0 Epithelialization: None 0 Tunneling: No 0 Undermining: No Wound Description Classification: Grade 2 Wound Margin: Flat and Intact Exudate Amount: Medium Exudate Type: Serosanguineous Exudate Color: red, brown Foul Odor After Cleansing: No Slough/Fibrino Yes Wound Bed Granulation Amount: None Present (0%) Exposed Structure Necrotic Amount: None Present (0%) Fascia Exposed: No Fat Layer (Subcutaneous Tissue) Exposed: No Tendon Exposed: No Muscle Exposed: No Joint Exposed: No Bone Exposed: Yes Electronic Signature(s) Signed: 08/19/2021 12:43:38 PM By: Sharyn Creamer RN, BSN Entered By: Sharyn Creamer on 08/18/2021 11:21:48 -------------------------------------------------------------------------------- Wound Assessment Details Patient Name: Date of Service: Christina Asa. 08/18/2021 10:45 A M Medical Record Number: [id number] Patient Account Number: [id number] Date of Birth/Sex: Treating RN: [birth year] (76 y.o. [name] Primary Care Owain Eckerman: Bing Matter Other Clinician: Referring Arelie Kuzel: Treating Daniell Mancinas/Extender: Willodean Rosenthal Weeks in Treatment: 10 Wound Status Wound Number: 6 Primary Diabetic Wound/Ulcer of the Lower Extremity Etiology: Wound Location: Left Metatarsal head first Wound Open Wounding Event: Gradually Appeared Status: Date Acquired: 05/05/2021 Comorbid Chronic sinus problems/congestion, Anemia, Chronic Obstructive Weeks Of Treatment: 10 History: Pulmonary Disease (COPD), Congestive Heart Failure, Clustered Wound: No Hypertension, Peripheral Venous Disease, Type II Diabetes, Dementia, Neuropathy Wound Measurements Length: (cm) Width: (cm) Depth: (cm) Area: (cm) Volume: (cm)  0 % Reduction in Area:  100% 0 % Reduction in Volume: 100% 0 Epithelialization: Large (67-100%) 0 Tunneling: No 0 Undermining: No Wound Description Classification: Grade 2 Wound Margin: Flat and Intact Exudate Amount: Medium Exudate Type: Serosanguineous Exudate Color: red, brown Foul Odor After Cleansing: No Slough/Fibrino Yes Wound Bed Granulation Amount: None Present (0%) Exposed Structure Necrotic Amount: None Present (0%) Fascia Exposed: No Fat Layer (Subcutaneous Tissue) Exposed: No Tendon Exposed: No Muscle Exposed: No Joint Exposed: No Bone Exposed: No Electronic Signature(s) Signed: 08/19/2021 12:43:38 PM By: Sharyn Creamer RN, BSN Entered By: Sharyn Creamer on 08/18/2021 11:23:21 -------------------------------------------------------------------------------- Wound Assessment Details Patient Name: Date of Service: Christina Asa. 08/18/2021 10:45 A M Medical Record Number: [id number] Patient Account Number: [id number] Date of Birth/Sex: Treating RN: 28-Aug-1945 (76 y.o. [name] Primary Care Atarah Cadogan: Bing Matter Other Clinician: Referring Lanisa Ishler: Treating Malgorzata Albert/Extender: Donia Ast in Treatment: 10 Wound Status Wound Number: 7 Primary Diabetic Wound/Ulcer of the Lower Extremity Etiology: Wound Location: Left T Fourth oe Wound Open Wounding Event: Gradually Appeared Status: Date Acquired: 05/05/2021 Comorbid Chronic sinus problems/congestion, Anemia, Chronic Obstructive Weeks Of Treatment: 10 History: Pulmonary Disease (COPD), Congestive Heart Failure, Clustered Wound: No Hypertension, Peripheral Venous Disease, Type II Diabetes, Dementia, Neuropathy Wound Measurements Length: (cm) Width: (cm) Depth: (cm) Area: (cm) Volume: (cm) 0 % Reduction in Area: 100% 0 % Reduction in Volume: 100% 0 Epithelialization: Large (67-100%) 0 Tunneling: No 0 Undermining: No Wound Description Classification: Grade 2 Wound Margin: Flat and  Intact Exudate Amount: None Present Foul Odor After Cleansing: No Slough/Fibrino No Wound Bed Granulation Amount: None Present (0%) Exposed Structure Necrotic Amount: None Present (0%) Fascia Exposed: No Fat Layer (Subcutaneous Tissue) Exposed: No Tendon Exposed: No Muscle Exposed: No Joint Exposed: No Bone Exposed: No Electronic Signature(s) Signed: 08/19/2021 12:43:38 PM By: Sharyn Creamer RN, BSN Entered By: Sharyn Creamer on 08/18/2021 11:24:01 -------------------------------------------------------------------------------- Wound Assessment Details Patient Name: Date of Service: Christina Asa. 08/18/2021 10:45 A M Medical Record Number: [id number] Patient Account Number: [id number] Date of Birth/Sex: Treating RN: April 11, 1945 (75 y.o. [name] Primary Care Steffanie Mingle: Bing Matter Other Clinician: Referring Jerolene Kupfer: Treating Janea Schwenn/Extender: Willodean Rosenthal Weeks in Treatment: 10 Wound Status Wound Number: 8 Primary Diabetic Wound/Ulcer of the Lower Extremity Etiology: Wound Location: Left Calcaneus Wound Open Wounding Event: Pressure Injury Status: Date Acquired: 06/09/2021 Comorbid Chronic sinus problems/congestion, Anemia, Chronic Obstructive Weeks Of Treatment: 10 History: Pulmonary Disease (COPD), Congestive Heart Failure, Clustered Wound: No Hypertension, Peripheral Venous Disease, Type II Diabetes, Dementia, Neuropathy Photos Wound Measurements Length: (cm) 0.4 Width: (cm) 0.3 Depth: (cm) 0.1 Area: (cm) 0.094 Volume: (cm) 0.009 % Reduction in Area: 98.4% % Reduction in Volume: 98.5% Epithelialization: Large (67-100%) Tunneling: No Undermining: No Wound Description Classification: Grade 2 Wound Margin: Flat and Intact Exudate Amount: Medium Exudate Type: Serosanguineous Exudate Color: red, brown Foul Odor After Cleansing: No Slough/Fibrino No Wound Bed Granulation Amount: None Present (0%) Exposed  Structure Necrotic Amount: Large (67-100%) Fascia Exposed: No Necrotic Quality: Adherent Slough Fat Layer (Subcutaneous Tissue) Exposed: Yes Tendon Exposed: No Muscle Exposed: No Joint Exposed: No Bone Exposed: No Treatment Notes Wound #8 (Calcaneus) Wound Laterality: Left Cleanser Soap and Water Discharge Instruction: May shower and wash wound with dial antibacterial soap and water prior to dressing change. Wound Cleanser Discharge Instruction: Cleanse the wound with wound cleanser prior to applying a clean dressing using gauze sponges, not tissue or cotton balls. Peri-Wound Care Sween Lotion (Moisturizing lotion) Discharge Instruction:  Apply moisturizing lotion as directed Topical Primary Dressing KerraCel Ag Gelling Fiber Dressing, 2x2 in (silver alginate) Discharge Instruction: Apply silver alginate to wound bed as instructed Secondary Dressing Woven Gauze Sponge, Non-Sterile 4x4 in Discharge Instruction: Apply over primary dressing as directed. Secured With Compression Wrap Kerlix Roll 4.5x3.1 (in/yd) Discharge Instruction: Apply Kerlix and Coban compression as directed. Coban Self-Adherent Wrap 4x5 (in/yd) Discharge Instruction: Apply over Kerlix as directed. Compression Stockings Add-Ons Electronic Signature(s) Signed: 08/19/2021 12:43:38 PM By: Sharyn Creamer RN, BSN Entered By: Sharyn Creamer on 08/18/2021 11:44:37 -------------------------------------------------------------------------------- Wound Assessment Details Patient Name: Date of Service: Christina Asa. 08/18/2021 10:45 A M Medical Record Number: [id number] Patient Account Number: [id number] Date of Birth/Sex: Treating RN: [birth year] (76 y.o. [name] Primary Care Limmie Schoenberg: Bing Matter Other Clinician: Referring Tanekia Ryans: Treating Carrell Palmatier/Extender: Willodean Rosenthal Weeks in Treatment: 10 Wound Status Wound Number: 9 Primary Abrasion Etiology: Wound Location:  Right, Lateral Knee Wound Open Wounding Event: Shear/Friction Status: Date Acquired: 08/18/2021 Comorbid Chronic sinus problems/congestion, Anemia, Chronic Obstructive Weeks Of Treatment: 0 History: Pulmonary Disease (COPD), Congestive Heart Failure, Clustered Wound: No Hypertension, Peripheral Venous Disease, Type II Diabetes, Dementia, Neuropathy Photos Wound Measurements Length: (cm) 1.7 Width: (cm) 0.6 Depth: (cm) 0.1 Area: (cm) 0.801 Volume: (cm) 0.08 % Reduction in Area: 0% % Reduction in Volume: 0% Epithelialization: Small (1-33%) Tunneling: No Undermining: No Wound Description Classification: Full Thickness Without Exposed Support Structures Wound Margin: Distinct, outline attached Exudate Amount: None Present Foul Odor After Cleansing: No Slough/Fibrino Yes Wound Bed Granulation Amount: None Present (0%) Exposed Structure Necrotic Amount: Large (67-100%) Fascia Exposed: No Necrotic Quality: Eschar Fat Layer (Subcutaneous Tissue) Exposed: Yes Tendon Exposed: No Muscle Exposed: No Joint Exposed: No Bone Exposed: No Treatment Notes Wound #9 (Knee) Wound Laterality: Right, Lateral Cleanser Soap and Water Discharge Instruction: May shower and wash wound with dial antibacterial soap and water prior to dressing change. Peri-Wound Care Topical Primary Dressing KerraCel Ag Gelling Fiber Dressing, 2x2 in (silver alginate) Discharge Instruction: Apply silver alginate to wound bed as instructed Secondary Dressing Woven Gauze Sponge, Non-Sterile 4x4 in Discharge Instruction: Apply over primary dressing as directed. Secured With [employer] 4x5 (in/yd) Discharge Instruction: Secure with Coban as directed. Kerlix Roll Sterile, 4.5x3.1 (in/yd) Discharge Instruction: Secure with Kerlix as directed. Compression Wrap Compression Stockings Add-Ons Electronic Signature(s) Signed: 08/19/2021 12:43:38 PM By: Sharyn Creamer RN, BSN Entered By: Sharyn Creamer  on 08/18/2021 11:45:04 -------------------------------------------------------------------------------- Vitals Details Patient Name: Date of Service: Christina Angst L. 08/18/2021 10:45 A M Medical Record Number: [id number] Patient Account Number: [id number] Date of Birth/Sex: Treating RN: [birth year] (76 y.o. [name] Primary Care Mikalah Skyles: Bing Matter Other Clinician: Referring Serria Sloma: Treating Avey Mcmanamon/Extender: Donia Ast in Treatment: 10 Vital Signs Time Taken: 11:23 Temperature (F): 97.7 Height (in): 67 Pulse (bpm): 134 Weight (lbs): 183 Respiratory Rate (breaths/min): 20 Body Mass Index (BMI): 28.7 Blood Pressure (mmHg): 129/84 Reference Range: 80 - 120 mg / dl Notes high heart rate due to afib Electronic Signature(s) Signed: 08/19/2021 12:43:38 PM By: Sharyn Creamer RN, BSN Entered By: Sharyn Creamer on 08/18/2021 11:27:41

## 2021-08-20 NOTE — Progress Notes (Signed)
RYANNE, MORAND ([id number]) Visit Report for 08/18/2021 Chief Complaint Document Details Patient Name: Date of Service: [place], [name] 08/18/2021 10:45 A M Medical Record Number: [id number] Patient Account Number: [id number] Date of Birth/Sex: Treating RN: [birth year] (76 y.o. [name] Primary Care Provider: Bing Matter Other Clinician: Referring Provider: Treating Provider/Extender: Donia Ast in Treatment: 10 Information Obtained from: Patient Chief Complaint 06/09/2021: Patient is here with multiple lower extremity ulcers on her shins and calves, as well as an open blister on her left heel, ulcers on her third and fourth toes and first metatarsal head on the left foot. She also has an area of reddened skin on her buttock, but this is not open. Electronic Signature(s) Signed: 08/18/2021 12:04:01 PM By: Fredirick Maudlin MD FACS Entered By: Fredirick Maudlin on 08/18/2021 12:04:01 -------------------------------------------------------------------------------- Debridement Details Patient Name: Date of Service: [name]. 08/18/2021 10:45 A M Medical Record Number: [id number] Patient Account Number: [id number] Date of Birth/Sex: Treating RN: 03/26/1946 (76 y.o. Donalda Ewings Primary Care Provider: Bing Matter Other Clinician: Referring Provider: Treating Provider/Extender: Donia Ast in Treatment: 10 Debridement Performed for Assessment: Wound #10 Right,Medial T Great oe Performed By: Physician Fredirick Maudlin, MD Debridement Type: Debridement Severity of Tissue Pre Debridement: Fat layer exposed Level of Consciousness (Pre-procedure): Awake and Alert Pre-procedure Verification/Time Out Yes - 11:52 Taken: Start Time: 11:52 Pain Control: Other : Benzocaine20% spray T Area Debrided (L x W): otal 0.2 (cm) x 0.3 (cm) = 0.06 (cm) Tissue and other material debrided: Non-Viable, Slough, Slough Level:  Non-Viable Tissue Debridement Description: Selective/Open Wound Instrument: Curette Bleeding: Minimum Hemostasis Achieved: Pressure Procedural Pain: 3 Post Procedural Pain: 2 Response to Treatment: Procedure was tolerated well Level of Consciousness (Post- Awake and Alert procedure): Post Debridement Measurements of Total Wound Length: (cm) 0.2 Width: (cm) 0.3 Depth: (cm) 0.1 Volume: (cm) 0.005 Character of Wound/Ulcer Post Debridement: Improved Severity of Tissue Post Debridement: Fat layer exposed Post Procedure Diagnosis Same as Pre-procedure Electronic Signature(s) Signed: 08/18/2021 2:52:08 PM By: Fredirick Maudlin MD FACS Signed: 08/19/2021 12:43:38 PM By: Sharyn Creamer RN, BSN Entered By: Sharyn Creamer on 08/18/2021 11:54:31 -------------------------------------------------------------------------------- Debridement Details Patient Name: Date of Service: [name]. 08/18/2021 10:45 A M Medical Record Number: [id number] Patient Account Number: [id number] Date of Birth/Sex: Treating RN: 10-22-1945 (76 y.o. Donalda Ewings Primary Care Provider: Bing Matter Other Clinician: Referring Provider: Treating Provider/Extender: Donia Ast in Treatment: 10 Debridement Performed for Assessment: Wound #9 Right,Lateral Knee Performed By: Physician Fredirick Maudlin, MD Debridement Type: Debridement Level of Consciousness (Pre-procedure): Awake and Alert Pre-procedure Verification/Time Out Yes - 11:52 Taken: Start Time: 11:52 Pain Control: Other : Benzocaine20% spray T Area Debrided (L x W): otal 1.7 (cm) x 0.8 (cm) = 1.36 (cm) Tissue and other material debrided: Non-Viable, Eschar, Slough, Slough Level: Non-Viable Tissue Debridement Description: Selective/Open Wound Instrument: Curette Bleeding: Minimum Hemostasis Achieved: Pressure Procedural Pain: 3 Post Procedural Pain: 2 Response to Treatment: Procedure was tolerated  well Level of Consciousness (Post- Awake and Alert procedure): Post Debridement Measurements of Total Wound Length: (cm) 1.7 Width: (cm) 0.8 Depth: (cm) 0.1 Volume: (cm) 0.107 Character of Wound/Ulcer Post Debridement: Improved Post Procedure Diagnosis Same as Pre-procedure Electronic Signature(s) Signed: 08/18/2021 2:52:08 PM By: Fredirick Maudlin MD FACS Signed: 08/19/2021 12:43:38 PM By: Sharyn Creamer RN, BSN Entered By: Sharyn Creamer on 08/18/2021 11:55:15 -------------------------------------------------------------------------------- Debridement Details Patient Name: Date of Service: Gerlean Ren, PA TSY L. 08/18/2021 10:45 A M Medical  Record Number: [id number] Patient Account Number: [id number] Date of Birth/Sex: Treating RN: [birth year] (76 y.o. Donalda Ewings Primary Care Provider: Other Clinician: Bing Matter Referring Provider: Treating Provider/Extender: Donia Ast in Treatment: 10 Debridement Performed for Assessment: Wound #11 Right,Lateral Ankle Performed By: Physician Fredirick Maudlin, MD Debridement Type: Debridement Severity of Tissue Pre Debridement: Fat layer exposed Level of Consciousness (Pre-procedure): Awake and Alert Pre-procedure Verification/Time Out Yes - 11:52 Taken: Start Time: 11:52 Pain Control: Other : Benzocaine20% spray T Area Debrided (L x W): otal 2 (cm) x 1 (cm) = 2 (cm) Tissue and other material debrided: Non-Viable, Eschar, Slough, Slough Level: Non-Viable Tissue Debridement Description: Selective/Open Wound Instrument: Curette Bleeding: Minimum Hemostasis Achieved: Pressure Procedural Pain: 3 Post Procedural Pain: 2 Response to Treatment: Procedure was tolerated well Level of Consciousness (Post- Awake and Alert procedure): Post Debridement Measurements of Total Wound Length: (cm) 3.5 Width: (cm) 1 Depth: (cm) 0.1 Volume: (cm) 0.275 Character of Wound/Ulcer Post Debridement:  Improved Severity of Tissue Post Debridement: Fat layer exposed Post Procedure Diagnosis Same as Pre-procedure Electronic Signature(s) Signed: 08/18/2021 2:52:08 PM By: Fredirick Maudlin MD FACS Signed: 08/19/2021 12:43:38 PM By: Sharyn Creamer RN, BSN Entered By: Sharyn Creamer on 08/18/2021 11:58:04 -------------------------------------------------------------------------------- Debridement Details Patient Name: Date of Service: [name]. 08/18/2021 10:45 A M Medical Record Number: [id number] Patient Account Number: [id number] Date of Birth/Sex: Treating RN: 12/11/1945 (76 y.o. Donalda Ewings Primary Care Provider: Bing Matter Other Clinician: Referring Provider: Treating Provider/Extender: Donia Ast in Treatment: 10 Debridement Performed for Assessment: Wound #3 Left,Anterior Lower Leg Performed By: Physician Fredirick Maudlin, MD Debridement Type: Debridement Severity of Tissue Pre Debridement: Fat layer exposed Level of Consciousness (Pre-procedure): Awake and Alert Pre-procedure Verification/Time Out Yes - 11:52 Taken: Start Time: 11:52 Pain Control: Other : Benzocaine20% spray T Area Debrided (L x W): otal 0.4 (cm) x 0.4 (cm) = 0.16 (cm) Tissue and other material debrided: Non-Viable, Slough, Slough Level: Non-Viable Tissue Debridement Description: Selective/Open Wound Instrument: Curette Bleeding: Minimum Hemostasis Achieved: Pressure Procedural Pain: 3 Post Procedural Pain: 2 Response to Treatment: Procedure was tolerated well Level of Consciousness (Post- Awake and Alert procedure): Post Debridement Measurements of Total Wound Length: (cm) 0.4 Width: (cm) 0.4 Depth: (cm) 0.1 Volume: (cm) 0.013 Character of Wound/Ulcer Post Debridement: Improved Severity of Tissue Post Debridement: Fat layer exposed Post Procedure Diagnosis Same as Pre-procedure Electronic Signature(s) Signed: 08/18/2021 2:52:08 PM By: Fredirick Maudlin MD FACS Signed: 08/19/2021 12:43:38 PM By: Sharyn Creamer RN, BSN Entered By: Sharyn Creamer on 08/18/2021 11:58:42 -------------------------------------------------------------------------------- HPI Details Patient Name: Date of Service: Quentin Angst L. 08/18/2021 10:45 A M Medical Record Number: [id number] Patient Account Number: [id number] Date of Birth/Sex: Treating RN: Dec 12, 1945 (76 y.o. [name] Primary Care Provider: Bing Matter Other Clinician: Referring Provider: Treating Provider/Extender: Donia Ast in Treatment: 10 History of Present Illness HPI Description: ADMISSION 06/09/2021: This is a 76 year old resident of [place]. She was recently hospitalized with sepsis. She had an iliopsoas abscess that was drained. While in the hospital, multiple wounds were identified. There were scattered superficial partial-thickness ulcerations on both lower extremities from the ankle to the knee. There was concern for a possible sacral pressure ulcer and left posterior thigh pressure ulcer. She also had 3 diabetic foot ulcers on the left foot. These involve the DIP joint of the third toe, the DIP joint of the fourth toe, and the plantar surface of the first metatarsal head. Both toe  wounds have exposed bone. They have been applying Santyl at her skilled nursing facility. She is here for further evaluation and management. Her past medical history is notable for history of type 2 diabetes, CVA, stage II CKD, CHF, peripheral vascular disease, history of DVT chronic atrial fibrillation on Xarelto, rheumatoid arthritis. , There has been no imaging performed of her lower extremities. ABIs in clinic today were noncompressible. Hemoglobin A1c on admission to the hospital was acceptable at 5.7%. 08/18/2021: I have not seen the patient since her first visit as she has been in and out of the hospital with multiple issues including multiple episodes of  sepsis and A-fib with RVR as well as altered mental status. During that time, the majority of the wounds that she had at her first visit have healed. She has a tiny wound on her medial right great toe that remains from her previous visits. She has what looks like an abrasion on her right knee as well as similar appearing wounds on her right lateral ankle. She says that she fell and landed on her sacrum earlier today and is in a fair amount of pain because of that. Electronic Signature(s) Signed: 08/18/2021 12:05:36 PM By: Fredirick Maudlin MD FACS Entered By: Fredirick Maudlin on 08/18/2021 12:05:35 -------------------------------------------------------------------------------- Physical Exam Details Patient Name: Date of Service: [name]. 08/18/2021 10:45 A M Medical Record Number: [id number] Patient Account Number: [id number] Date of Birth/Sex: Treating RN: 1946-03-03 (76 y.o. [name] Primary Care Provider: Bing Matter Other Clinician: Referring Provider: Treating Provider/Extender: Willodean Rosenthal Weeks in Treatment: 10 Constitutional . Irregularly irregular with tachycardia. . . No acute distress. Respiratory Normal work of breathing on room air. Notes 08/18/2021: The wounds that she had previously have mostly healed. She has a residual tiny ulcer on her right medial great toe with some eschar overlying this. She has new wounds on her right knee and right lateral foot that look like they were abrasions. These are predominantly covered in eschar. The wounds on her left foot that were present at her initial visit are no longer there and have healed completely. Electronic Signature(s) Signed: 08/18/2021 12:07:39 PM By: Fredirick Maudlin MD FACS Entered By: Fredirick Maudlin on 08/18/2021 12:07:39 -------------------------------------------------------------------------------- Physician Orders Details Patient Name: Date of Service: [name].  08/18/2021 10:45 A M Medical Record Number: [id number] Patient Account Number: [id number] Date of Birth/Sex: Treating RN: [birth year] (76 y.o. Donalda Ewings Primary Care Provider: Bing Matter Other Clinician: Referring Provider: Treating Provider/Extender: Donia Ast in Treatment: 10 Verbal / Phone Orders: No Diagnosis Coding ICD-10 Coding Code Description E11.621 Type 2 diabetes mellitus with foot ulcer E11.622 Type 2 diabetes mellitus with other skin ulcer L97.526 Non-pressure chronic ulcer of other part of left foot with bone involvement without evidence of necrosis I73.9 Peripheral vascular disease, unspecified I87.311 Chronic venous hypertension (idiopathic) with ulcer of right lower extremity I87.312 Chronic venous hypertension (idiopathic) with ulcer of left lower extremity I10 Essential (primary) hypertension J17.91 Chronic diastolic (congestive) heart failure I48.20 Chronic atrial fibrillation, unspecified I63.9 Cerebral infarction, unspecified Follow-up Appointments ppointment in 1 week. - Dr. Celine Ahr room 2 Return A Bathing/ Shower/ Hygiene May shower with protection but do not get wound dressing(s) wet. - may purchase cast protector from [facility], [facility], [facility] Edema Control - Lymphedema / SCD / Other Elevate legs to the level of the heart or above for 30 minutes daily and/or when sitting, a frequency of: - throughout the day Moisturize legs daily.  Off-Loading Other: - cushion heels with pillow or heel cups Wound Treatment Wound #10 - T Great oe Wound Laterality: Right, Medial Cleanser: Soap and Water Every Other Day/7 Days Discharge Instructions: May shower and wash wound with dial antibacterial soap and water prior to dressing change. Cleanser: Wound Cleanser Every Other Day/7 Days Discharge Instructions: Cleanse the wound with wound cleanser prior to applying a clean dressing using gauze sponges, not tissue or cotton  balls. Peri-Wound Care: Sween Lotion (Moisturizing lotion) Every Other Day/7 Days Discharge Instructions: Apply moisturizing lotion as directed Prim Dressing: KerraCel Ag Gelling Fiber Dressing, 2x2 in (silver alginate) Every Other Day/7 Days ary Discharge Instructions: Apply silver alginate to wound bed as instructed Secondary Dressing: Woven Gauze Sponge, Non-Sterile 4x4 in Every Other Day/7 Days Discharge Instructions: Apply over primary dressing as directed. Compression Wrap: Kerlix Roll 4.5x3.1 (in/yd) Every Other Day/7 Days Discharge Instructions: Apply Kerlix and Coban compression as directed. Compression Wrap: Coban Self-Adherent Wrap 4x5 (in/yd) Every Other Day/7 Days Discharge Instructions: Apply over Kerlix as directed. Wound #11 - Ankle Wound Laterality: Right, Lateral Cleanser: Soap and Water Every Other Day/7 Days Discharge Instructions: May shower and wash wound with dial antibacterial soap and water prior to dressing change. Cleanser: Wound Cleanser Every Other Day/7 Days Discharge Instructions: Cleanse the wound with wound cleanser prior to applying a clean dressing using gauze sponges, not tissue or cotton balls. Peri-Wound Care: Sween Lotion (Moisturizing lotion) Every Other Day/7 Days Discharge Instructions: Apply moisturizing lotion as directed Prim Dressing: KerraCel Ag Gelling Fiber Dressing, 2x2 in (silver alginate) Every Other Day/7 Days ary Discharge Instructions: Apply silver alginate to wound bed as instructed Secondary Dressing: Woven Gauze Sponge, Non-Sterile 4x4 in Every Other Day/7 Days Discharge Instructions: Apply over primary dressing as directed. Compression Wrap: Kerlix Roll 4.5x3.1 (in/yd) Every Other Day/7 Days Discharge Instructions: Apply Kerlix and Coban compression as directed. Compression Wrap: Coban Self-Adherent Wrap 4x5 (in/yd) Every Other Day/7 Days Discharge Instructions: Apply over Kerlix as directed. Wound #3 - Lower Leg Wound Laterality:  Left, Anterior Cleanser: Soap and Water Every Other Day/7 Days Discharge Instructions: May shower and wash wound with dial antibacterial soap and water prior to dressing change. Cleanser: Wound Cleanser Every Other Day/7 Days Discharge Instructions: Cleanse the wound with wound cleanser prior to applying a clean dressing using gauze sponges, not tissue or cotton balls. Peri-Wound Care: Sween Lotion (Moisturizing lotion) Every Other Day/7 Days Discharge Instructions: Apply moisturizing lotion as directed Prim Dressing: KerraCel Ag Gelling Fiber Dressing, 2x2 in (silver alginate) Every Other Day/7 Days ary Discharge Instructions: Apply silver alginate to wound bed as instructed Secondary Dressing: Woven Gauze Sponge, Non-Sterile 4x4 in Every Other Day/7 Days Discharge Instructions: Apply over primary dressing as directed. Compression Wrap: Kerlix Roll 4.5x3.1 (in/yd) Every Other Day/7 Days Discharge Instructions: Apply Kerlix and Coban compression as directed. Compression Wrap: Coban Self-Adherent Wrap 4x5 (in/yd) Every Other Day/7 Days Discharge Instructions: Apply over Kerlix as directed. Wound #8 - Calcaneus Wound Laterality: Left Cleanser: Soap and Water Every Other Day/7 Days Discharge Instructions: May shower and wash wound with dial antibacterial soap and water prior to dressing change. Cleanser: Wound Cleanser Every Other Day/7 Days Discharge Instructions: Cleanse the wound with wound cleanser prior to applying a clean dressing using gauze sponges, not tissue or cotton balls. Peri-Wound Care: Sween Lotion (Moisturizing lotion) Every Other Day/7 Days Discharge Instructions: Apply moisturizing lotion as directed Prim Dressing: KerraCel Ag Gelling Fiber Dressing, 2x2 in (silver alginate) Every Other Day/7 Days ary Discharge Instructions: Apply  silver alginate to wound bed as instructed Secondary Dressing: Woven Gauze Sponge, Non-Sterile 4x4 in Every Other Day/7 Days Discharge  Instructions: Apply over primary dressing as directed. Compression Wrap: Kerlix Roll 4.5x3.1 (in/yd) Every Other Day/7 Days Discharge Instructions: Apply Kerlix and Coban compression as directed. Compression Wrap: Coban Self-Adherent Wrap 4x5 (in/yd) Every Other Day/7 Days Discharge Instructions: Apply over Kerlix as directed. Wound #9 - Knee Wound Laterality: Right, Lateral Cleanser: Soap and Water Discharge Instructions: May shower and wash wound with dial antibacterial soap and water prior to dressing change. Prim Dressing: KerraCel Ag Gelling Fiber Dressing, 2x2 in (silver alginate) ary Discharge Instructions: Apply silver alginate to wound bed as instructed Secondary Dressing: Woven Gauze Sponge, Non-Sterile 4x4 in Discharge Instructions: Apply over primary dressing as directed. Secured With: Coban Self-Adherent Wrap 4x5 (in/yd) Discharge Instructions: Secure with Coban as directed. Secured With: [employer], 4.5x3.1 (in/yd) Discharge Instructions: Secure with Kerlix as directed. Electronic Signature(s) Signed: 08/18/2021 2:52:08 PM By: Fredirick Maudlin MD FACS Entered By: Fredirick Maudlin on 08/18/2021 12:08:00 -------------------------------------------------------------------------------- Problem List Details Patient Name: Date of Service: [name]. 08/18/2021 10:45 A M Medical Record Number: [id number] Patient Account Number: [id number] Date of Birth/Sex: Treating RN: [birth year] (76 y.o. 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 involvement  06/09/2021 No Yes without evidence of necrosis I73.9 Peripheral vascular disease, unspecified 06/09/2021 No Yes I87.311 Chronic venous hypertension (idiopathic) with ulcer of right lower extremity 06/09/2021 No Yes I87.312 Chronic venous hypertension (idiopathic) with ulcer of left lower extremity 06/09/2021 No Yes I10 Essential (primary) hypertension 06/09/2021 No Yes G25.63 Chronic diastolic (congestive) heart failure 06/09/2021 No Yes I48.20 Chronic atrial fibrillation, unspecified 06/09/2021 No Yes I63.9 Cerebral infarction, unspecified 06/09/2021 No Yes Inactive Problems Resolved Problems Electronic Signature(s) Signed: 08/18/2021 12:03:34 PM By: Fredirick Maudlin MD FACS Entered By: Fredirick Maudlin on 08/18/2021 12:03:33 -------------------------------------------------------------------------------- Progress Note Details Patient Name: Date of Service: [name]. 08/18/2021 10:45 A M Medical Record Number: [id number] Patient Account Number: [id number] Date of Birth/Sex: Treating RN: 10-17-1945 (75 y.o. [name] Primary Care Provider: Bing Matter Other Clinician: Referring Provider: Treating Provider/Extender: Donia Ast in Treatment: 10 Subjective Chief Complaint Information obtained from Patient 06/09/2021: Patient is here with multiple lower extremity ulcers on her shins and calves, as well as an open blister on her left heel, ulcers on her third and fourth toes and first metatarsal head on the left foot. She also has an area of reddened skin on her buttock, but this is not open. History of Present Illness (HPI) ADMISSION 06/09/2021: This is a 76 year old resident of [place]. She was recently hospitalized with sepsis. She had an iliopsoas abscess that was drained. While in the hospital, multiple wounds were identified. There were scattered superficial partial-thickness ulcerations on both lower extremities from the ankle to the knee. There was  concern for a possible sacral pressure ulcer and left posterior thigh pressure ulcer. She also had 3 diabetic foot ulcers on the left foot. These involve the DIP joint of the third toe, the DIP joint of the fourth toe, and the plantar surface of the first metatarsal head. Both toe  wounds have exposed bone. They have been applying Santyl at her skilled nursing facility. She is here for further evaluation and management. Her past medical history is notable for history of type 2 diabetes, CVA, stage II CKD, CHF, peripheral vascular disease, history of DVT chronic atrial fibrillation on Xarelto, rheumatoid arthritis. , There has been no imaging performed of her lower extremities. ABIs in clinic today were noncompressible. Hemoglobin A1c on admission to the hospital was acceptable at 5.7%. 08/18/2021: I have not seen the patient since her first visit as she has been in and out of the hospital with multiple issues including multiple episodes of sepsis and A-fib with RVR as well as altered mental status. During that time, the majority of the wounds that she had at her first visit have healed. She has a tiny wound on her medial right great toe that remains from her previous visits. She has what looks like an abrasion on her right knee as well as similar appearing wounds on her right lateral ankle. She says that she fell and landed on her sacrum earlier today and is in a fair amount of pain because of that. Patient History Unable to Obtain Patient History due to Dementia. Information obtained from Chart. Social History Never smoker, Marital Status - Divorced, Alcohol Use - Never, Drug Use - No History, Caffeine Use - Daily. Medical History Ear/Nose/Mouth/Throat Patient has history of Chronic sinus problems/congestion Hematologic/Lymphatic Patient has history of Anemia - due to CKD Respiratory Patient has history of Chronic Obstructive Pulmonary Disease (COPD) Cardiovascular Patient has history of  Congestive Heart Failure, Hypertension, Peripheral Venous Disease Endocrine Patient has history of Type II Diabetes Genitourinary Denies history of End Stage Renal Disease Neurologic Patient has history of Dementia - Alzheimers, Neuropathy Medical A Surgical History Notes nd Eyes diabetic retinopathy Cardiovascular atherosclerotic heart disease A fib hyperlipedemia Gastrointestinal GERD Genitourinary CKD stage II Neurologic persistent mood disorder, affective vascular dementia Objective Constitutional Irregularly irregular with tachycardia. No acute distress. Vitals Time Taken: 11:23 AM, Height: 67 in, Weight: 183 lbs, BMI: 28.7, Temperature: 97.7 F, Pulse: 134 bpm, Respiratory Rate: 20 breaths/min, Blood Pressure: 129/84 mmHg. General Notes: high heart rate due to afib Respiratory Normal work of breathing on room air. General Notes: 08/18/2021: The wounds that she had previously have mostly healed. She has a residual tiny ulcer on her right medial great toe with some eschar overlying this. She has new wounds on her right knee and right lateral foot that look like they were abrasions. These are predominantly covered in eschar. The wounds on her left foot that were present at her initial visit are no longer there and have healed completely. Integumentary (Hair, Skin) Wound #1 status is Open. Original cause of wound was Gradually Appeared. The date acquired was: 05/05/2021. The wound has been in treatment 10 weeks. The wound is located on the Right,Anterior Lower Leg. The wound measures 0cm length x 0cm width x 0cm depth; 0cm^2 area and 0cm^3 volume. There is no tunneling or undermining noted. There is a none present amount of drainage noted. There is no granulation within the wound bed. There is no necrotic tissue within the wound bed. Wound #10 status is Open. Original cause of wound was Gradually Appeared. The date acquired was: 08/18/2021. The wound is located on the Right,Medial T  [place]. The wound measures 0.2cm length x 0.3cm width x 0.1cm depth; 0.047cm^2 area and 0.005cm^3 volume. There is Fat Layer (Subcutaneous Tissue) exposed. There is a medium amount of serosanguineous  drainage noted. The wound margin is distinct with the outline attached to the wound base. There is small (1-33%) red granulation within the wound bed. There is a large (67-100%) amount of necrotic tissue within the wound bed including Adherent Slough. Wound #11 status is Open. Original cause of wound was Shear/Friction. The date acquired was: 08/18/2021. The wound is located on the Right,Lateral Ankle. The wound measures 8.3cm length x 1cm width x 0.1cm depth; 6.519cm^2 area and 0.652cm^3 volume. There is Fat Layer (Subcutaneous Tissue) exposed. There is no tunneling or undermining noted. There is a medium amount of serosanguineous drainage noted. The wound margin is distinct with the outline attached to the wound base. There is small (1-33%) red granulation within the wound bed. There is a large (67-100%) amount of necrotic tissue within the wound bed including Eschar and Adherent Slough. Wound #2 status is Open. Original cause of wound was Gradually Appeared. The date acquired was: 05/06/2021. The wound has been in treatment 10 weeks. The wound is located on the Right,Posterior Lower Leg. The wound measures 0cm length x 0cm width x 0cm depth; 0cm^2 area and 0cm^3 volume. There is no tunneling or undermining noted. There is a none present amount of drainage noted. There is no granulation within the wound bed. There is no necrotic tissue within the wound bed. Wound #3 status is Open. Original cause of wound was Gradually Appeared. The date acquired was: 05/05/2021. The wound has been in treatment 10 weeks. The wound is located on the Left,Anterior Lower Leg. The wound measures 0.4cm length x 0.4cm width x 0.1cm depth; 0.126cm^2 area and 0.013cm^3 volume. There is Fat Layer (Subcutaneous Tissue) exposed.  There is no tunneling or undermining noted. There is a medium amount of serosanguineous drainage noted. The wound margin is flat and intact. There is large (67-100%) red granulation within the wound bed. There is no necrotic tissue within the wound bed. Wound #4 status is Open. Original cause of wound was Gradually Appeared. The date acquired was: 05/05/2021. The wound has been in treatment 10 weeks. The wound is located on the Left,Medial Lower Leg. The wound measures 0cm length x 0cm width x 0cm depth; 0cm^2 area and 0cm^3 volume. There is no tunneling or undermining noted. There is a none present amount of drainage noted. There is no granulation within the wound bed. There is no necrotic tissue within the wound bed. Wound #5 status is Open. Original cause of wound was Gradually Appeared. The date acquired was: 05/05/2021. The wound has been in treatment 10 weeks. The wound is located on the Left T Third. The wound measures 0cm length x 0cm width x 0cm depth; 0cm^2 area and 0cm^3 volume. There is bone exposed. oe There is no tunneling or undermining noted. There is a medium amount of serosanguineous drainage noted. The wound margin is flat and intact. There is no granulation within the wound bed. There is no necrotic tissue within the wound bed. Wound #6 status is Open. Original cause of wound was Gradually Appeared. The date acquired was: 05/05/2021. The wound has been in treatment 10 weeks. The wound is located on the Left Metatarsal head first. The wound measures 0cm length x 0cm width x 0cm depth; 0cm^2 area and 0cm^3 volume. There is no tunneling or undermining noted. There is a medium amount of serosanguineous drainage noted. The wound margin is flat and intact. There is no granulation within the wound bed. There is no necrotic tissue within the wound bed. Wound #7 status  is Open. Original cause of wound was Gradually Appeared. The date acquired was: 05/05/2021. The wound has been in treatment 10  weeks. The wound is located on the Left T Fourth. The wound measures 0cm length x 0cm width x 0cm depth; 0cm^2 area and 0cm^3 volume. There is no tunneling or oe undermining noted. There is a none present amount of drainage noted. The wound margin is flat and intact. There is no granulation within the wound bed. There is no necrotic tissue within the wound bed. Wound #8 status is Open. Original cause of wound was Pressure Injury. The date acquired was: 06/09/2021. The wound has been in treatment 10 weeks. The wound is located on the Left Calcaneus. The wound measures 0.4cm length x 0.3cm width x 0.1cm depth; 0.094cm^2 area and 0.009cm^3 volume. There is Fat Layer (Subcutaneous Tissue) exposed. There is no tunneling or undermining noted. There is a medium amount of serosanguineous drainage noted. The wound margin is flat and intact. There is no granulation within the wound bed. There is a large (67-100%) amount of necrotic tissue within the wound bed including Adherent Slough. Wound #9 status is Open. Original cause of wound was Shear/Friction. The date acquired was: 08/18/2021. The wound is located on the Right,Lateral Knee. The wound measures 1.7cm length x 0.6cm width x 0.1cm depth; 0.801cm^2 area and 0.08cm^3 volume. There is Fat Layer (Subcutaneous Tissue) exposed. There is no tunneling or undermining noted. There is a none present amount of drainage noted. The wound margin is distinct with the outline attached to the wound base. There is no granulation within the wound bed. There is a large (67-100%) amount of necrotic tissue within the wound bed including Eschar. Assessment Active Problems ICD-10 Type 2 diabetes mellitus with foot ulcer Type 2 diabetes mellitus with other skin ulcer Non-pressure chronic ulcer of other part of left foot with bone involvement without evidence of necrosis Peripheral vascular disease, unspecified Chronic venous hypertension (idiopathic) with ulcer of right lower  extremity Chronic venous hypertension (idiopathic) with ulcer of left lower extremity Essential (primary) hypertension Chronic diastolic (congestive) heart failure Chronic atrial fibrillation, unspecified Cerebral infarction, unspecified Procedures Wound #10 Pre-procedure diagnosis of Wound #10 is a Diabetic Wound/Ulcer of the Lower Extremity located on the Right,Medial T Great .Severity of Tissue Pre oe Debridement is: Fat layer exposed. There was a Selective/Open Wound Non-Viable Tissue Debridement with a total area of 0.06 sq cm performed by Fredirick Maudlin, MD. With the following instrument(s): Curette to remove Non-Viable tissue/material. Material removed includes [facility] after achieving pain control using Other (Benzocaine20% spray). No specimens were taken. A time out was conducted at 11:52, prior to the start of the procedure. A Minimum amount of bleeding was controlled with Pressure. The procedure was tolerated well with a pain level of 3 throughout and a pain level of 2 following the procedure. Post Debridement Measurements: 0.2cm length x 0.3cm width x 0.1cm depth; 0.005cm^3 volume. Character of Wound/Ulcer Post Debridement is improved. Severity of Tissue Post Debridement is: Fat layer exposed. Post procedure Diagnosis Wound #10: Same as Pre-Procedure Wound #11 Pre-procedure diagnosis of Wound #11 is a Diabetic Wound/Ulcer of the Lower Extremity located on the Right,Lateral Ankle .Severity of Tissue Pre Debridement is: Fat layer exposed. There was a Selective/Open Wound Non-Viable Tissue Debridement with a total area of 2 sq cm performed by Fredirick Maudlin, MD. With the following instrument(s): Curette to remove Non-Viable tissue/material. Material removed includes Eschar and Slough and after achieving pain control using Other (Benzocaine20% spray). No  specimens were taken. A time out was conducted at 11:52, prior to the start of the procedure. A Minimum amount of bleeding was  controlled with Pressure. The procedure was tolerated well with a pain level of 3 throughout and a pain level of 2 following the procedure. Post Debridement Measurements: 3.5cm length x 1cm width x 0.1cm depth; 0.275cm^3 volume. Character of Wound/Ulcer Post Debridement is improved. Severity of Tissue Post Debridement is: Fat layer exposed. Post procedure Diagnosis Wound #11: Same as Pre-Procedure Wound #3 Pre-procedure diagnosis of Wound #3 is a Diabetic Wound/Ulcer of the Lower Extremity located on the Left,Anterior Lower Leg .Severity of Tissue Pre Debridement is: Fat layer exposed. There was a Selective/Open Wound Non-Viable Tissue Debridement with a total area of 0.16 sq cm performed by Fredirick Maudlin, MD. With the following instrument(s): Curette to remove Non-Viable tissue/material. Material removed includes [facility] after achieving pain control using Other (Benzocaine20% spray). No specimens were taken. A time out was conducted at 11:52, prior to the start of the procedure. A Minimum amount of bleeding was controlled with Pressure. The procedure was tolerated well with a pain level of 3 throughout and a pain level of 2 following the procedure. Post Debridement Measurements: 0.4cm length x 0.4cm width x 0.1cm depth; 0.013cm^3 volume. Character of Wound/Ulcer Post Debridement is improved. Severity of Tissue Post Debridement is: Fat layer exposed. Post procedure Diagnosis Wound #3: Same as Pre-Procedure Wound #9 Pre-procedure diagnosis of Wound #9 is an Abrasion located on the Right,Lateral Knee . There was a Selective/Open Wound Non-Viable Tissue Debridement with a total area of 1.36 sq cm performed by Fredirick Maudlin, MD. With the following instrument(s): Curette to remove Non-Viable tissue/material. Material removed includes Eschar and Slough and after achieving pain control using Other (Benzocaine20% spray). No specimens were taken. A time out was conducted at 11:52, prior to the start of  the procedure. A Minimum amount of bleeding was controlled with Pressure. The procedure was tolerated well with a pain level of 3 throughout and a pain level of 2 following the procedure. Post Debridement Measurements: 1.7cm length x 0.8cm width x 0.1cm depth; 0.107cm^3 volume. Character of Wound/Ulcer Post Debridement is improved. Post procedure Diagnosis Wound #9: Same as Pre-Procedure Plan Follow-up Appointments: Return Appointment in 1 week. - Dr. Celine Ahr room 2 Bathing/ Shower/ Hygiene: May shower with protection but do not get wound dressing(s) wet. - may purchase cast protector from [facility], [facility], [employer] Edema Control - Lymphedema / SCD / Other: Elevate legs to the level of the heart or above for 30 minutes daily and/or when sitting, a frequency of: - throughout the day Moisturize legs daily. Off-Loading: Other: - cushion heels with pillow or heel cups WOUND #10: - T Great Wound Laterality: Right, Medial oe Cleanser: Soap and Water Every Other Day/7 Days Discharge Instructions: May shower and wash wound with dial antibacterial soap and water prior to dressing change. Cleanser: Wound Cleanser Every Other Day/7 Days Discharge Instructions: Cleanse the wound with wound cleanser prior to applying a clean dressing using gauze sponges, not tissue or cotton balls. Peri-Wound Care: Sween Lotion (Moisturizing lotion) Every Other Day/7 Days Discharge Instructions: Apply moisturizing lotion as directed Prim Dressing: KerraCel Ag Gelling Fiber Dressing, 2x2 in (silver alginate) Every Other Day/7 Days ary Discharge Instructions: Apply silver alginate to wound bed as instructed Secondary Dressing: Woven Gauze Sponge, Non-Sterile 4x4 in Every Other Day/7 Days Discharge Instructions: Apply over primary dressing as directed. Com pression Wrap: Kerlix Roll 4.5x3.1 (in/yd) Every Other Day/7 Days Discharge  Instructions: Apply Kerlix and Coban compression as directed. Com pression Wrap: Coban  Self-Adherent Wrap 4x5 (in/yd) Every Other Day/7 Days Discharge Instructions: Apply over Kerlix as directed. WOUND #11: - Ankle Wound Laterality: Right, Lateral Cleanser: Soap and Water Every Other Day/7 Days Discharge Instructions: May shower and wash wound with dial antibacterial soap and water prior to dressing change. Cleanser: Wound Cleanser Every Other Day/7 Days Discharge Instructions: Cleanse the wound with wound cleanser prior to applying a clean dressing using gauze sponges, not tissue or cotton balls. Peri-Wound Care: Sween Lotion (Moisturizing lotion) Every Other Day/7 Days Discharge Instructions: Apply moisturizing lotion as directed Prim Dressing: KerraCel Ag Gelling Fiber Dressing, 2x2 in (silver alginate) Every Other Day/7 Days ary Discharge Instructions: Apply silver alginate to wound bed as instructed Secondary Dressing: Woven Gauze Sponge, Non-Sterile 4x4 in Every Other Day/7 Days Discharge Instructions: Apply over primary dressing as directed. Com pression Wrap: Kerlix Roll 4.5x3.1 (in/yd) Every Other Day/7 Days Discharge Instructions: Apply Kerlix and Coban compression as directed. Com pression Wrap: Coban Self-Adherent Wrap 4x5 (in/yd) Every Other Day/7 Days Discharge Instructions: Apply over Kerlix as directed. WOUND #3: - Lower Leg Wound Laterality: Left, Anterior Cleanser: Soap and Water Every Other Day/7 Days Discharge Instructions: May shower and wash wound with dial antibacterial soap and water prior to dressing change. Cleanser: Wound Cleanser Every Other Day/7 Days Discharge Instructions: Cleanse the wound with wound cleanser prior to applying a clean dressing using gauze sponges, not tissue or cotton balls. Peri-Wound Care: Sween Lotion (Moisturizing lotion) Every Other Day/7 Days Discharge Instructions: Apply moisturizing lotion as directed Prim Dressing: KerraCel Ag Gelling Fiber Dressing, 2x2 in (silver alginate) Every Other Day/7 Days ary Discharge  Instructions: Apply silver alginate to wound bed as instructed Secondary Dressing: Woven Gauze Sponge, Non-Sterile 4x4 in Every Other Day/7 Days Discharge Instructions: Apply over primary dressing as directed. Com pression Wrap: Kerlix Roll 4.5x3.1 (in/yd) Every Other Day/7 Days Discharge Instructions: Apply Kerlix and Coban compression as directed. Com pression Wrap: Coban Self-Adherent Wrap 4x5 (in/yd) Every Other Day/7 Days Discharge Instructions: Apply over Kerlix as directed. WOUND #8: - Calcaneus Wound Laterality: Left Cleanser: Soap and Water Every Other Day/7 Days Discharge Instructions: May shower and wash wound with dial antibacterial soap and water prior to dressing change. Cleanser: Wound Cleanser Every Other Day/7 Days Discharge Instructions: Cleanse the wound with wound cleanser prior to applying a clean dressing using gauze sponges, not tissue or cotton balls. Peri-Wound Care: Sween Lotion (Moisturizing lotion) Every Other Day/7 Days Discharge Instructions: Apply moisturizing lotion as directed Prim Dressing: KerraCel Ag Gelling Fiber Dressing, 2x2 in (silver alginate) Every Other Day/7 Days ary Discharge Instructions: Apply silver alginate to wound bed as instructed Secondary Dressing: Woven Gauze Sponge, Non-Sterile 4x4 in Every Other Day/7 Days Discharge Instructions: Apply over primary dressing as directed. Com pression Wrap: Kerlix Roll 4.5x3.1 (in/yd) Every Other Day/7 Days Discharge Instructions: Apply Kerlix and Coban compression as directed. Com pression Wrap: Coban Self-Adherent Wrap 4x5 (in/yd) Every Other Day/7 Days Discharge Instructions: Apply over Kerlix as directed. WOUND #9: - Knee Wound Laterality: Right, Lateral Cleanser: Soap and Water Discharge Instructions: May shower and wash wound with dial antibacterial soap and water prior to dressing change. Prim Dressing: KerraCel Ag Gelling Fiber Dressing, 2x2 in (silver alginate) ary Discharge Instructions:  Apply silver alginate to wound bed as instructed Secondary Dressing: Woven Gauze Sponge, Non-Sterile 4x4 in Discharge Instructions: Apply over primary dressing as directed. Secured With: Coban Self-Adherent Wrap 4x5 (in/yd) Discharge Instructions: Secure with  Coban as directed. Secured With: [employer], 4.5x3.1 (in/yd) Discharge Instructions: Secure with Kerlix as directed. 08/18/2021: The wounds that she had previously have mostly healed. She has a residual tiny ulcer on her right medial great toe with some eschar overlying this. She has new wounds on her right knee and right lateral foot that look like they were abrasions. These are predominantly covered in eschar. The wounds on her left foot that were present at her initial visit are no longer there and have healed completely. I used a curette to remove the eschar from her wounds. The underlying surface is viable with granulation tissue. No concern for necrosis or infection. As she was unable to complete formal ABIs secondary to involuntary movement, we will treat her conservatively with silver alginate and Kerlix/Coban wraps. Follow- up in 1 week. Electronic Signature(s) Signed: 08/18/2021 12:10:47 PM By: Fredirick Maudlin MD FACS Entered By: Fredirick Maudlin on 08/18/2021 12:10:46 -------------------------------------------------------------------------------- HxROS Details Patient Name: Date of Service: Gerlean Ren, [place] TSY L. 08/18/2021 10:45 A M Medical Record Number: [id number] Patient Account Number: [id number] Date of Birth/Sex: Treating RN: Aug 11, 1945 (76 y.o. 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 Respiratory Medical History: Positive for: Chronic Obstructive Pulmonary Disease (COPD) Cardiovascular Medical History: Positive for: Congestive Heart Failure; Hypertension; Peripheral Venous Disease Past Medical History Notes: atherosclerotic heart disease A fib hyperlipedemia Gastrointestinal Medical History: Past Medical History Notes: GERD Endocrine Medical History: Positive for: Type II Diabetes Treated with: Insulin, Oral agents Blood sugar tested every day: Yes Tested : Genitourinary Medical History: Negative for: End Stage Renal Disease Past Medical History Notes: CKD stage II Neurologic Medical History: Positive for: Dementia - Alzheimers; Neuropathy Past Medical History Notes: persistent mood disorder, affective vascular dementia HBO Extended History Items Ear/Nose/Mouth/Throat: Chronic sinus problems/congestion Immunizations Pneumococcal Vaccine: Received Pneumococcal Vaccination: Yes Received Pneumococcal Vaccination On or After 60th Birthday: Yes Implantable Devices None Family and Social History Never smoker; Marital Status - Divorced; Alcohol Use: Never; Drug Use: No History; Caffeine Use: Daily; Financial Concerns: No; Food, Clothing or Shelter Needs: No; Support System Lacking: No; Transportation Concerns: No Engineer, maintenance) Signed: 08/18/2021 2:52:08 PM By: Fredirick Maudlin MD FACS Signed: 08/19/2021 5:25:10 PM By: Levan Hurst RN, BSN Entered By: Fredirick Maudlin on 08/18/2021 12:05:57 -------------------------------------------------------------------------------- [place] Details Patient Name: Date of Service: [name] 08/18/2021 Medical Record Number: [id number] Patient Account Number: [id number] Date of Birth/Sex: Treating RN: 1945-11-23 (76 y.o. 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 of necrosis I73.9 Peripheral vascular disease, unspecified I87.311 Chronic venous hypertension (idiopathic) with ulcer of right lower extremity I87.312 Chronic venous hypertension (idiopathic) with ulcer of left lower extremity I10 Essential (primary) hypertension D14.97 Chronic diastolic (congestive) heart failure I48.20 Chronic atrial fibrillation, unspecified I63.9 Cerebral infarction, unspecified Facility Procedures CPT4 Code: [id number] Description: 85027 - DEBRIDE WOUND 1ST 20 SQ CM OR < ICD-10 Diagnosis Description E11.622 Type 2  diabetes mellitus with other skin ulcer E11.621 Type 2 diabetes mellitus with foot ulcer L97.526 Non-pressure chronic ulcer of other part of left foot with  bone involvement without Modifier: evidence of necr Quantity: 1 osis Physician Procedures : CPT4 Code Description Modifier [id number] - WC PHYS LEVEL 3 - EST PT 25 ICD-10 Diagnosis Description E11.622 Type 2 diabetes mellitus with other skin ulcer L97.526 Non-pressure chronic ulcer of other part of left foot with bone involvement without  evidence of necro E11.621 Type 2 diabetes mellitus with foot ulcer Quantity: 1 sis : [id number] - WC PHYS DEBR WO ANESTH 20 SQ CM ICD-10 Diagnosis Description E11.622 Type 2 diabetes mellitus with other skin ulcer E11.621 Type 2 diabetes mellitus with foot ulcer L97.526 Non-pressure chronic ulcer of other part of left foot with bone  involvement without evidence of necro Quantity: 1 sis Electronic Signature(s) Signed: 08/18/2021 12:12:46 PM By: Fredirick Maudlin MD FACS Entered  By: Fredirick Maudlin on 08/18/2021 12:12:45

## 2021-08-25 ENCOUNTER — Encounter (HOSPITAL_BASED_OUTPATIENT_CLINIC_OR_DEPARTMENT_OTHER): Payer: 59 | Admitting: General Surgery

## 2021-08-25 DIAGNOSIS — E11621 Type 2 diabetes mellitus with foot ulcer: Secondary | ICD-10-CM | POA: Diagnosis not present

## 2021-08-25 NOTE — Progress Notes (Signed)
[name], [name] ([id number]) Visit Report for 08/25/2021 Arrival Information Details Patient Name: Date of Service: [name], [name] 08/25/2021 12:45 PM Medical Record Number: [id number] Patient Account Number: [id number] Date of Birth/Sex: Treating RN: [birth year] (76 y.o. [name] Primary Care [name]: [name] Other Clinician: Referring Norabelle Kondo: Treating Ronon Ferger/Extender: Donia Ast in Treatment: 11 Visit Information History Since Last Visit Added or deleted any medications: No Patient Arrived: Wheel Chair Any new allergies or adverse reactions: No Arrival Time: 12:47 Had a fall or experienced change in No Accompanied By: self activities of daily living that may affect Transfer Assistance: Manual risk of falls: Patient Identification Verified: Yes Signs or symptoms of abuse/neglect since last visito No Secondary Verification Process Completed: Yes Hospitalized since last visit: No Patient Has Alerts: Yes Implantable device outside of the clinic excluding No Patient Alerts: Patient on Blood Thinner cellular tissue based products placed in the center R ABI non compressible since last visit: L ABI non compressible Has Dressing in Place as Prescribed: Yes Pain Present Now: Yes Electronic Signature(s) Signed: 08/25/2021 5:55:49 PM By: Adline Peals Entered By: Adline Peals on 08/25/2021 12:55:00 -------------------------------------------------------------------------------- Encounter Discharge Information Details Patient Name: Date of Service: [name] 08/25/2021 12:45 PM Medical Record Number: [id number] Patient Account Number: [id number] Date of Birth/Sex: Treating RN: 1946-04-04 (76 y.o. [name] Primary Care Raimundo Corbit: [name] Other Clinician: Referring Neo Yepiz: Treating Austen Wygant/Extender: Donia Ast in Treatment: 11 Encounter Discharge Information  Items Post Procedure Vitals Discharge Condition: Stable Temperature (F): 97.7 Ambulatory Status: Wheelchair Pulse (bpm): 112 Discharge Destination: [place] Respiratory Rate (breaths/min): 20 Orders Sent: Yes Blood Pressure (mmHg): 111/58 Transportation: Private Auto Accompanied By: caregiver Schedule Follow-up Appointment: Yes Clinical Summary of Care: Patient Declined Electronic Signature(s) Signed: 08/25/2021 5:55:49 PM By: Adline Peals Entered By: Adline Peals on 08/25/2021 17:50:09 -------------------------------------------------------------------------------- Lower Extremity Assessment Details Patient Name: Date of Service: [name], [name] 08/25/2021 12:45 PM Medical Record Number: [id number] Patient Account Number: [id number] Date of Birth/Sex: Treating RN: [birth year] (75 y.o. [name] Primary Care Huck Ashworth: [name] Other Clinician: Referring Azaan Leask: Treating Orah Sonnen/Extender: Willodean Rosenthal Weeks in Treatment: 11 Edema Assessment Assessed: Shirlyn Goltz: No] [Right: No] E[Left: dema] [Right: :] Calf Left: Right: Point of Measurement: 28 cm From Medial Instep 23.3 cm 23 cm Ankle Left: Right: Point of Measurement: 11 cm From Medial Instep 18 cm 17 cm Vascular Assessment Pulses: Dorsalis Pedis Palpable: [Left:Yes] [Right:Yes] Electronic Signature(s) Signed: 08/25/2021 5:55:49 PM By: Adline Peals Entered By: Adline Peals on 08/25/2021 13:01:45 -------------------------------------------------------------------------------- Multi Wound Chart Details Patient Name: Date of Service: [name] 08/25/2021 12:45 PM Medical Record Number: [id number] Patient Account Number: [id number] Date of Birth/Sex: Treating RN: Feb 04, 1946 (76 y.o. [name] Primary Care Detravion Tester: [name] Other Clinician: Referring Jessy Cybulski: Treating Sadeel Fiddler/Extender: Donia Ast in Treatment: 11 Vital Signs Height(in): 54 Pulse(bpm): 112 Weight(lbs): 183 Blood Pressure(mmHg): 111/58 Body Mass Index(BMI): 28.7 Temperature(F): 97.7 Respiratory Rate(breaths/min): 20 Photos: Right, Medial T Great oe Right, Lateral Ankle Sacrum Wound Location: Gradually Appeared Shear/Friction Pressure Injury Wounding Event: Diabetic Wound/Ulcer of the Lower Diabetic Wound/Ulcer of the Lower Pressure Ulcer Primary Etiology: Extremity Extremity Chronic sinus problems/congestion, Chronic sinus problems/congestion, Chronic sinus problems/congestion, Comorbid History: Anemia, Chronic Obstructive Anemia, Chronic Obstructive Anemia, Chronic Obstructive Pulmonary Disease (COPD), Pulmonary Disease (COPD), Pulmonary Disease (COPD), Congestive Heart Failure, Congestive Heart Failure, Congestive Heart Failure, Hypertension, Peripheral Venous Hypertension, Peripheral Venous Hypertension, Peripheral Venous Disease,  Type II Diabetes, Dementia, Disease, Type II Diabetes, Dementia, Disease, Type II Diabetes, Dementia, Neuropathy Neuropathy Neuropathy 08/18/2021 08/18/2021 08/15/2021 Date Acquired: 1 1 0 Weeks of Treatment: Open Healed - Epithelialized Open Wound Status: No No No Wound Recurrence: No Yes No Clustered Wound: N/A 5 N/A Clustered Quantity: 0.2x0.3x0.1 0x0x0 1.3x1.3x0.1 Measurements L x W x D (cm) 0.047 0 1.327 A (cm) : rea 0.005 0 0.133 Volume (cm) : 0.00% 100.00% 0.00% % Reduction in A rea: 0.00% 100.00% 0.00% % Reduction in Volume: Grade 2 Grade 2 Category/Stage II Classification: Medium None Present Medium Exudate A mount: Serosanguineous N/A Serosanguineous Exudate Type: red, brown N/A red, brown Exudate Color: Distinct, outline attached Distinct, outline attached Distinct, outline attached Wound Margin: Small (1-33%) None Present (0%) Medium (34-66%) Granulation A mount: Red N/A Red, Pink Granulation Quality: Large (67-100%) None  Present (0%) Medium (34-66%) Necrotic A mount: Eschar N/A Adherent Slough Necrotic Tissue: Fat Layer (Subcutaneous Tissue): Yes Fascia: No Fat Layer (Subcutaneous Tissue): Yes Exposed Structures: Fascia: No Fat Layer (Subcutaneous Tissue): No Fascia: No Tendon: No Tendon: No Tendon: No Muscle: No Muscle: No Muscle: No Joint: No Joint: No Joint: No Bone: No Bone: No Bone: No Large (67-100%) Large (67-100%) Small (1-33%) Epithelialization: Debridement - Selective/Open Wound N/A Debridement - Excisional Debridement: Pre-procedure Verification/Time Out 13:32 N/A 13:32 Taken: Other N/A Other Pain Control: Necrotic/Eschar N/A Subcutaneous, Slough Tissue Debrided: Skin/Epidermis N/A Skin/Subcutaneous Tissue Level: 0.04 N/A 1.69 Debridement A (sq cm): rea Curette N/A Curette Instrument: Minimum N/A Minimum Bleeding: Pressure N/A Pressure Hemostasis A chieved: 5 N/A 5 Procedural Pain: 0 N/A 0 Post Procedural Pain: Procedure was tolerated well N/A Procedure was tolerated well Debridement Treatment Response: 0.2x0.3x0.1 N/A 1.3x1.3x0.1 Post Debridement Measurements L x W x D (cm) 0.005 N/A 0.133 Post Debridement Volume: (cm) N/A N/A Category/Stage II Post Debridement Stage: Debridement N/A Debridement Procedures Performed: Wound Number: '3 8 9 '$ Photos: Left, Anterior Lower Leg Left Calcaneus Right, Lateral Knee Wound Location: Gradually Appeared Pressure Injury Shear/Friction Wounding Event: Diabetic Wound/Ulcer of the Lower Diabetic Wound/Ulcer of the Lower Abrasion Primary Etiology: Extremity Extremity Chronic sinus problems/congestion, Chronic sinus problems/congestion, Chronic sinus problems/congestion, Comorbid History: Anemia, Chronic Obstructive Anemia, Chronic Obstructive Anemia, Chronic Obstructive Pulmonary Disease (COPD), Pulmonary Disease (COPD), Pulmonary Disease (COPD), Congestive Heart Failure, Congestive Heart Failure, Congestive Heart  Failure, Hypertension, Peripheral Venous Hypertension, Peripheral Venous Hypertension, Peripheral Venous Disease, Type II Diabetes, Dementia, Disease, Type II Diabetes, Dementia, Disease, Type II Diabetes, Dementia, Neuropathy Neuropathy Neuropathy 05/05/2021 06/09/2021 08/18/2021 Date Acquired: '11 11 1 '$ Weeks of Treatment: Open Healed - Epithelialized Open Wound Status: No No No Wound Recurrence: Yes No No Clustered Wound: N/A N/A N/A Clustered Quantity: 0.2x0.2x0.1 0x0x0 1.2x0.5x0.1 Measurements L x W x D (cm) 0.031 0 0.471 A (cm) : rea 0.003 0 0.047 Volume (cm) : 84.20% 100.00% 41.20% % Reduction in Area: 85.00% 100.00% 41.30% % Reduction in Volume: Grade 2 Grade 2 Full Thickness Without Exposed Classification: Support Structures Medium None Present None Present Exudate A mount: Serosanguineous N/A N/A Exudate Type: red, brown N/A N/A Exudate Color: Flat and Intact Flat and Intact Distinct, outline attached Wound Margin: Small (1-33%) None Present (0%) Small (1-33%) Granulation A mount: Red N/A Red Granulation Quality: Large (67-100%) None Present (0%) Large (67-100%) Necrotic A mount: Eschar N/A Eschar Necrotic Tissue: Fat Layer (Subcutaneous Tissue): Yes Fascia: No Fat Layer (Subcutaneous Tissue): Yes Exposed Structures: Fascia: No Fat Layer (Subcutaneous Tissue): No Fascia: No Tendon: No Tendon: No Tendon: No Muscle: No Muscle: No Muscle:  No Joint: No Joint: No Joint: No Bone: No Bone: No Bone: No Large (67-100%) Large (67-100%) Large (67-100%) Epithelialization: Debridement - Selective/Open Wound N/A Debridement - Excisional Debridement: Pre-procedure Verification/Time Out 13:32 N/A 13:32 Taken: Other N/A Other Pain Control: Necrotic/Eschar N/A Subcutaneous, Slough Tissue Debrided: Skin/Epidermis N/A Skin/Subcutaneous Tissue Level: 0.04 N/A 0.6 Debridement A (sq cm): rea Curette N/A Curette Instrument: Minimum N/A  Minimum Bleeding: Pressure N/A Pressure Hemostasis A chieved: 5 N/A 5 Procedural Pain: 0 N/A 0 Post Procedural Pain: Procedure was tolerated well N/A Procedure was tolerated well Debridement Treatment Response: 0.2x0.2x0.1 N/A 1.2x0.5x0.1 Post Debridement Measurements L x W x D (cm) 0.003 N/A 0.047 Post Debridement Volume: (cm) N/A N/A N/A Post Debridement Stage: Debridement N/A Debridement Procedures Performed: Treatment Notes Electronic Signature(s) Signed: 08/25/2021 2:03:11 PM By: Fredirick Maudlin MD FACS Signed: 08/25/2021 5:55:49 PM By: Adline Peals Entered By: Fredirick Maudlin on 08/25/2021 14:03:11 -------------------------------------------------------------------------------- Multi-Disciplinary Care Plan Details Patient Name: Date of Service: [name] 08/25/2021 12:45 PM Medical Record Number: [id number] Patient Account Number: [id number] Date of Birth/Sex: Treating RN: July 04, 1945 (76 y.o. [name] Primary Care Raynisha Avilla: [name] Other Clinician: Referring Emmert Roethler: Treating Eberardo Demello/Extender: Donia Ast in Treatment: 11 Multidisciplinary Care Plan reviewed with physician Active Inactive Abuse / Safety / Falls / Self Care Management Nursing Diagnoses: Potential for falls Potential for injury related to falls Goals: Patient will not experience any injury related to falls Date Initiated: 06/09/2021 Target Resolution Date: 09/15/2021 Goal Status: Active Patient/caregiver will verbalize/demonstrate measures taken to prevent injury and/or falls Date Initiated: 06/09/2021 Target Resolution Date: 09/15/2021 Goal Status: Active Interventions: Assess Activities of Daily Living upon admission and as needed Assess fall risk on admission and as needed Assess: immobility, friction, shearing, incontinence upon admission and as needed Assess impairment of mobility on admission and as needed per policy Assess  personal safety and home safety (as indicated) on admission and as needed Assess self care needs on admission and as needed Provide education on fall prevention Provide education on personal and home safety Notes: Nutrition Nursing Diagnoses: Potential for alteratiion in Nutrition/Potential for imbalanced nutrition Goals: Patient/caregiver agrees to and verbalizes understanding of need to use nutritional supplements and/or vitamins as prescribed Date Initiated: 06/09/2021 Target Resolution Date: 09/15/2021 Goal Status: Active Patient/caregiver will maintain therapeutic glucose control Date Initiated: 06/09/2021 Target Resolution Date: 09/15/2021 Goal Status: Active Interventions: Assess HgA1c results as ordered upon admission and as needed Assess patient nutrition upon admission and as needed per policy Provide education on elevated blood sugars and impact on wound healing Provide education on nutrition Treatment Activities: Education provided on Nutrition : 06/09/2021 Notes: Wound/Skin Impairment Nursing Diagnoses: Impaired tissue integrity Knowledge deficit related to ulceration/compromised skin integrity Goals: Patient/caregiver will verbalize understanding of skin care regimen Date Initiated: 06/09/2021 Target Resolution Date: 09/15/2021 Goal Status: Active Interventions: Assess patient/caregiver ability to obtain necessary supplies Assess patient/caregiver ability to perform ulcer/skin care regimen upon admission and as needed Assess ulceration(s) every visit Provide education on ulcer and skin care Notes: Electronic Signature(s) Signed: 08/25/2021 5:55:49 PM By: Adline Peals Entered By: Adline Peals on 08/25/2021 13:19:17 -------------------------------------------------------------------------------- Pain Assessment Details Patient Name: Date of Service: [name] 08/25/2021 12:45 PM Medical Record Number: [id number] Patient Account Number:  [id number] Date of Birth/Sex: Treating RN: July 25, 1945 (76 y.o. [name] Primary Care Anyi Fels: [name] Other Clinician: Referring Lenvil Swaim: Treating Siriah Treat/Extender: Donia Ast in Treatment: 11 Active Problems Location of Pain Severity and Description of Pain  Patient Has Paino Yes Site Locations Rate the pain. Current Pain Level: 10 Pain Management and Medication Current Pain Management: Medication: Yes Electronic Signature(s) Signed: 08/25/2021 5:55:49 PM By: Adline Peals Entered By: Adline Peals on 08/25/2021 12:56:12 -------------------------------------------------------------------------------- Patient/Caregiver Education Details Patient Name: Date of Service: [name] 5/24/2023andnbsp12:45 PM Medical Record Number: [id number] Patient Account Number: [id number] Date of Birth/Gender: Treating RN: 08/28/1945 (76 y.o. [name] Primary Care Physician: [name] Other Clinician: Referring Physician: Treating Physician/Extender: Donia Ast in Treatment: 11 Education Assessment Education Provided To: Patient Education Topics Provided Wound/Skin Impairment: Methods: Explain/Verbal Responses: Reinforcements needed, State content correctly Electronic Signature(s) Signed: 08/25/2021 5:55:49 PM By: Adline Peals Entered By: Adline Peals on 08/25/2021 13:19:29 -------------------------------------------------------------------------------- Wound Assessment Details Patient Name: Date of Service: [name] 08/25/2021 12:45 PM Medical Record Number: [id number] Patient Account Number: [id number] Date of Birth/Sex: Treating RN: 14-Jun-1945 (75 y.o. [name] Primary Care Viviane Semidey: [name] Other Clinician: Referring Dequane Strahan: Treating Coburn Knaus/Extender: Donia Ast in Treatment: 11 Wound  Status Wound Number: 10 Primary Diabetic Wound/Ulcer of the Lower Extremity Etiology: Wound Location: Right, Medial T Great oe Wound Open Wounding Event: Gradually Appeared Status: Date Acquired: 08/18/2021 Comorbid Chronic sinus problems/congestion, Anemia, Chronic Obstructive Weeks Of Treatment: 1 History: Pulmonary Disease (COPD), Congestive Heart Failure, Clustered Wound: No Hypertension, Peripheral Venous Disease, Type II Diabetes, Dementia, Neuropathy Photos Wound Measurements Length: (cm) 0.2 Width: (cm) 0.3 Depth: (cm) 0.1 Area: (cm) 0.047 Volume: (cm) 0.005 % Reduction in Area: 0% % Reduction in Volume: 0% Epithelialization: Large (67-100%) Tunneling: No Undermining: No Wound Description Classification: Grade 2 Wound Margin: Distinct, outline attached Exudate Amount: Medium Exudate Type: Serosanguineous Exudate Color: red, brown Foul Odor After Cleansing: No Slough/Fibrino No Wound Bed Granulation Amount: Small (1-33%) Exposed Structure Granulation Quality: Red Fascia Exposed: No Necrotic Amount: Large (67-100%) Fat Layer (Subcutaneous Tissue) Exposed: Yes Necrotic Quality: Eschar Tendon Exposed: No Muscle Exposed: No Joint Exposed: No Bone Exposed: No Treatment Notes Wound #10 (Toe Great) Wound Laterality: Right, Medial Cleanser Soap and Water Discharge Instruction: May shower and wash wound with dial antibacterial soap and water prior to dressing change. Wound Cleanser Discharge Instruction: Cleanse the wound with wound cleanser prior to applying a clean dressing using gauze sponges, not tissue or cotton balls. Peri-Wound Care Sween Lotion (Moisturizing lotion) Discharge Instruction: Apply moisturizing lotion as directed Topical Primary Dressing KerraCel Ag Gelling Fiber Dressing, 2x2 in (silver alginate) Discharge Instruction: Apply silver alginate to wound bed as instructed Secondary Dressing Woven Gauze Sponge, Non-Sterile 4x4 in Discharge  Instruction: Apply over primary dressing as directed. Secured With Compression Wrap Kerlix Roll 4.5x3.1 (in/yd) Discharge Instruction: Apply Kerlix and Coban compression as directed. Coban Self-Adherent Wrap 4x5 (in/yd) Discharge Instruction: Apply over Kerlix as directed. Compression Stockings Add-Ons Electronic Signature(s) Signed: 08/25/2021 5:55:49 PM By: Adline Peals Entered By: Adline Peals on 08/25/2021 13:05:53 -------------------------------------------------------------------------------- Wound Assessment Details Patient Name: Date of Service: [name], [name] 08/25/2021 12:45 PM Medical Record Number: [id number] Patient Account Number: [id number] Date of Birth/Sex: Treating RN: 1945-11-28 (76 y.o. [name] Primary Care Alazay Leicht: [name] Other Clinician: Referring Milissa Fesperman: Treating Dakarai Mcglocklin/Extender: Willodean Rosenthal Weeks in Treatment: 11 Wound Status Wound Number: 11 Primary Diabetic Wound/Ulcer of the Lower Extremity Etiology: Wound Location: Right, Lateral Ankle Wound Healed - Epithelialized Wounding Event: Shear/Friction Status: Date Acquired: 08/18/2021 Comorbid Chronic sinus problems/congestion, Anemia, Chronic Obstructive Weeks Of Treatment: 1 History: Pulmonary Disease (COPD), Congestive Heart Failure, Clustered Wound:  Yes Hypertension, Peripheral Venous Disease, Type II Diabetes, Dementia, Neuropathy Photos Wound Measurements Length: (cm) Width: (cm) Depth: (cm) Clustered Quantity: Area: (cm) Volume: (cm) Wound Description Classification: Grade 2 Wound Margin: Distinct, outline attached Exudate Amount: None Present Foul Odor After Cleansing: Slough/Fibrino 0 % Reduction in Area: 100% 0 % Reduction in Volume: 100% 0 Epithelialization: Large (67-100%) 5 Tunneling: No 0 Undermining: No 0 No No Wound Bed Granulation Amount: None Present (0%) Exposed Structure Necrotic Amount: None Present  (0%) Fascia Exposed: No Fat Layer (Subcutaneous Tissue) Exposed: No Tendon Exposed: No Muscle Exposed: No Joint Exposed: No Bone Exposed: No Electronic Signature(s) Signed: 08/25/2021 5:55:49 PM By: Adline Peals Entered By: Adline Peals on 08/25/2021 13:37:54 -------------------------------------------------------------------------------- Wound Assessment Details Patient Name: Date of Service: [name] 08/25/2021 12:45 PM Medical Record Number: [id number] Patient Account Number: [id number] Date of Birth/Sex: Treating RN: Jul 22, 1945 (76 y.o. [name] Primary Care Yaziel Brandon: [name] Other Clinician: Referring Berdella Bacot: Treating Aviyon Hocevar/Extender: Willodean Rosenthal Weeks in Treatment: 11 Wound Status Wound Number: 12 Primary Pressure Ulcer Etiology: Wound Location: Sacrum Wound Open Wounding Event: Pressure Injury Status: Date Acquired: 08/15/2021 Comorbid Chronic sinus problems/congestion, Anemia, Chronic Obstructive Weeks Of Treatment: 0 History: Pulmonary Disease (COPD), Congestive Heart Failure, Clustered Wound: No Hypertension, Peripheral Venous Disease, Type II Diabetes, Dementia, Neuropathy Photos Wound Measurements Length: (cm) 1.3 Width: (cm) 1.3 Depth: (cm) 0.1 Area: (cm) 1.327 Volume: (cm) 0.133 % Reduction in Area: 0% % Reduction in Volume: 0% Epithelialization: Small (1-33%) Tunneling: No Undermining: No Wound Description Classification: Category/Stage II Wound Margin: Distinct, outline attached Exudate Amount: Medium Exudate Type: Serosanguineous Exudate Color: red, brown Wound Bed Granulation Amount: Medium (34-66%) Granulation Quality: Red, Pink Necrotic Amount: Medium (34-66%) Necrotic Quality: Adherent Slough Foul Odor After Cleansing: No Slough/Fibrino Yes Exposed Structure Fascia Exposed: No Fat Layer (Subcutaneous Tissue) Exposed: Yes Tendon Exposed: No Muscle Exposed: No Joint  Exposed: No Bone Exposed: No Treatment Notes Wound #12 (Sacrum) Cleanser Soap and Water Discharge Instruction: May shower and wash wound with dial antibacterial soap and water prior to dressing change. Wound Cleanser Discharge Instruction: Cleanse the wound with wound cleanser prior to applying a clean dressing using gauze sponges, not tissue or cotton balls. Peri-Wound Care Zinc Oxide Ointment 30g tube Discharge Instruction: Apply Zinc Oxide to periwound with each dressing change Topical Primary Dressing Iodosorb Gel 10 (gm) Tube Discharge Instruction: Apply to wound bed as instructed Secondary Dressing Bordered Gauze, 4x4 in Discharge Instruction: Apply over primary dressing as directed. Woven Gauze Sponge, Non-Sterile 4x4 in Discharge Instruction: Apply over primary dressing as directed. Secured With Compression Wrap Compression Stockings Environmental education officer) Signed: 08/25/2021 5:55:49 PM By: Adline Peals Entered By: Adline Peals on 08/25/2021 13:12:32 -------------------------------------------------------------------------------- Wound Assessment Details Patient Name: Date of Service: [name] 08/25/2021 12:45 PM Medical Record Number: [id number] Patient Account Number: [id number] Date of Birth/Sex: Treating RN: December 07, 1945 (76 y.o. [name] Primary Care Estaban [place]: [name] Other Clinician: Referring Katrena Stehlin: Treating Chella Chapdelaine/Extender: Donia Ast in Treatment: 11 Wound Status Wound Number: 3 Primary Diabetic Wound/Ulcer of the Lower Extremity Etiology: Wound Location: Left, Anterior Lower Leg Wound Open Wounding Event: Gradually Appeared Status: Date Acquired: 05/05/2021 Comorbid Chronic sinus problems/congestion, Anemia, Chronic Obstructive Weeks Of Treatment: 11 History: Pulmonary Disease (COPD), Congestive Heart Failure, Clustered Wound: Yes Hypertension, Peripheral Venous Disease,  Type II Diabetes, Dementia, Neuropathy Photos Wound Measurements Length: (cm) 0.2 Width: (cm) 0.2 Depth: (cm) 0.1 Area: (cm) 0.031 Volume: (cm) 0.003 % Reduction  in Area: 84.2% % Reduction in Volume: 85% Epithelialization: Large (67-100%) Tunneling: No Undermining: No Wound Description Classification: Grade 2 Wound Margin: Flat and Intact Exudate Amount: Medium Exudate Type: Serosanguineous Exudate Color: red, brown Foul Odor After Cleansing: No Slough/Fibrino No Wound Bed Granulation Amount: Small (1-33%) Exposed Structure Granulation Quality: Red Fascia Exposed: No Necrotic Amount: Large (67-100%) Fat Layer (Subcutaneous Tissue) Exposed: Yes Necrotic Quality: Eschar Tendon Exposed: No Muscle Exposed: No Joint Exposed: No Bone Exposed: No Treatment Notes Wound #3 (Lower Leg) Wound Laterality: Left, Anterior Cleanser Soap and Water Discharge Instruction: May shower and wash wound with dial antibacterial soap and water prior to dressing change. Wound Cleanser Discharge Instruction: Cleanse the wound with wound cleanser prior to applying a clean dressing using gauze sponges, not tissue or cotton balls. Peri-Wound Care Sween Lotion (Moisturizing lotion) Discharge Instruction: Apply moisturizing lotion as directed Topical Primary Dressing KerraCel Ag Gelling Fiber Dressing, 2x2 in (silver alginate) Discharge Instruction: Apply silver alginate to wound bed as instructed Secondary Dressing Woven Gauze Sponge, Non-Sterile 4x4 in Discharge Instruction: Apply over primary dressing as directed. Secured With Compression Wrap Kerlix Roll 4.5x3.1 (in/yd) Discharge Instruction: Apply Kerlix and Coban compression as directed. Coban Self-Adherent Wrap 4x5 (in/yd) Discharge Instruction: Apply over Kerlix as directed. Compression Stockings Add-Ons Electronic Signature(s) Signed: 08/25/2021 5:55:49 PM By: Adline Peals Signed: 08/25/2021 5:55:49 PM By: Sabas Sous By: Adline Peals on 08/25/2021 13:06:47 -------------------------------------------------------------------------------- Wound Assessment Details Patient Name: Date of Service: [name] 08/25/2021 12:45 PM Medical Record Number: [id number] Patient Account Number: [id number] Date of Birth/Sex: Treating RN: 12-Oct-1945 (76 y.o. [name] Primary Care Deklen Popelka: [name] Other Clinician: Referring Nieshia Larmon: Treating Kaegan Stigler/Extender: Donia Ast in Treatment: 11 Wound Status Wound Number: 8 Primary Diabetic Wound/Ulcer of the Lower Extremity Etiology: Wound Location: Left Calcaneus Wound Healed - Epithelialized Wounding Event: Pressure Injury Status: Date Acquired: 06/09/2021 Comorbid Chronic sinus problems/congestion, Anemia, Chronic Obstructive Weeks Of Treatment: 11 History: Pulmonary Disease (COPD), Congestive Heart Failure, Clustered Wound: No Hypertension, Peripheral Venous Disease, Type II Diabetes, Dementia, Neuropathy Photos Wound Measurements Length: (cm) Width: (cm) Depth: (cm) Area: (cm) Volume: (cm) 0 % Reduction in Area: 100% 0 % Reduction in Volume: 100% 0 Epithelialization: Large (67-100%) 0 Tunneling: No 0 Undermining: No Wound Description Classification: Grade 2 Wound Margin: Flat and Intact Exudate Amount: None Present Foul Odor After Cleansing: No Slough/Fibrino No Wound Bed Granulation Amount: None Present (0%) Exposed Structure Necrotic Amount: None Present (0%) Fascia Exposed: No Fat Layer (Subcutaneous Tissue) Exposed: No Tendon Exposed: No Muscle Exposed: No Joint Exposed: No Bone Exposed: No Electronic Signature(s) Signed: 08/25/2021 5:55:49 PM By: Adline Peals Entered By: Adline Peals on 08/25/2021 13:38:19 -------------------------------------------------------------------------------- Wound Assessment Details Patient Name: Date of  Service: [name] 08/25/2021 12:45 PM Medical Record Number: [id number] Patient Account Number: [id number] Date of Birth/Sex: Treating RN: [birth year] (76 y.o. [name] Primary Care Minna Dumire: [name] Other Clinician: Referring Gedalya Jim: Treating Davene Jobin/Extender: Willodean Rosenthal Weeks in Treatment: 11 Wound Status Wound Number: 9 Primary Abrasion Etiology: Wound Location: Right, Lateral Knee Wound Open Wounding Event: Shear/Friction Status: Date Acquired: 08/18/2021 Comorbid Chronic sinus problems/congestion, Anemia, Chronic Obstructive Weeks Of Treatment: 1 History: Pulmonary Disease (COPD), Congestive Heart Failure, Clustered Wound: No Hypertension, Peripheral Venous Disease, Type II Diabetes, Dementia, Neuropathy Photos Wound Measurements Length: (cm) 1.2 Width: (cm) 0.5 Depth: (cm) 0.1 Area: (cm) 0.471 Volume: (cm) 0.047 % Reduction in Area: 41.2% % Reduction in Volume: 41.3% Epithelialization: Large (  67-100%) Tunneling: No Undermining: No Wound Description Classification: Full Thickness Without Exposed Support Structures Wound Margin: Distinct, outline attached Exudate Amount: None Present Foul Odor After Cleansing: No Slough/Fibrino No Wound Bed Granulation Amount: Small (1-33%) Exposed Structure Granulation Quality: Red Fascia Exposed: No Necrotic Amount: Large (67-100%) Fat Layer (Subcutaneous Tissue) Exposed: Yes Necrotic Quality: Eschar Tendon Exposed: No Muscle Exposed: No Joint Exposed: No Bone Exposed: No Treatment Notes Wound #9 (Knee) Wound Laterality: Right, Lateral Cleanser Soap and Water Discharge Instruction: May shower and wash wound with dial antibacterial soap and water prior to dressing change. Peri-Wound Care Topical Primary Dressing KerraCel Ag Gelling Fiber Dressing, 2x2 in (silver alginate) Discharge Instruction: Apply silver alginate to wound bed as instructed Secondary  Dressing Woven Gauze Sponge, Non-Sterile 4x4 in Discharge Instruction: Apply over primary dressing as directed. Secured With [employer] 4x5 (in/yd) Discharge Instruction: Secure with Coban as directed. Kerlix Roll Sterile, 4.5x3.1 (in/yd) Discharge Instruction: Secure with Kerlix as directed. Compression Wrap Compression Stockings Add-Ons Electronic Signature(s) Signed: 08/25/2021 5:55:49 PM By: Adline Peals Entered By: Adline Peals on 08/25/2021 13:07:48 -------------------------------------------------------------------------------- Vitals Details Patient Name: Date of Service: [name]. 08/25/2021 12:45 PM Medical Record Number: [id number] Patient Account Number: [id number] Date of Birth/Sex: Treating RN: 1945-11-19 (76 y.o. [name] Primary Care Tyee Vandevoorde: [name] Other Clinician: Referring Maxxwell Edgett: Treating Louvenia Golomb/Extender: Donia Ast in Treatment: 11 Vital Signs Time Taken: 12:55 Temperature (F): 97.7 Height (in): 67 Pulse (bpm): 112 Weight (lbs): 183 Respiratory Rate (breaths/min): 20 Body Mass Index (BMI): 28.7 Blood Pressure (mmHg): 111/58 Reference Range: 80 - 120 mg / dl Electronic Signature(s) Signed: 08/25/2021 5:55:49 PM By: Adline Peals Entered By: Adline Peals on 08/25/2021 12:56:02

## 2021-08-25 NOTE — Progress Notes (Signed)
Christina, [name] ([id number]) Visit Report for 08/25/2021 Chief Complaint Document Details Patient Name: Date of Service: [name], [name] 08/25/2021 12:45 PM Medical Record Number: [id number] Patient Account Number: [id number] Date of Birth/Sex: Treating RN: 12-Oct-1945 (76 y.o. Harlow Ohms Primary Care Provider: Bing Matter Other Clinician: Referring Provider: Treating Provider/Extender: Donia Ast in Treatment: 11 Information Obtained from: Patient Chief Complaint 06/09/2021: Patient is here with multiple lower extremity ulcers on her shins and calves, as well as an open blister on her left heel, ulcers on her third and fourth toes and first metatarsal head on the left foot. She also has an area of reddened skin on her buttock, but this is not open. Electronic Signature(s) Signed: 08/25/2021 2:03:23 PM By: Fredirick Maudlin MD FACS Entered By: Fredirick Maudlin on 08/25/2021 14:03:23 -------------------------------------------------------------------------------- Debridement Details Patient Name: Date of Service: [name] 08/25/2021 12:45 PM Medical Record Number: [id number] Patient Account Number: [id number] Date of Birth/Sex: Treating RN: 06-05-1945 (76 y.o. Harlow Ohms Primary Care Provider: Bing Matter Other Clinician: Referring Provider: Treating Provider/Extender: Donia Ast in Treatment: 11 Debridement Performed for Assessment: Wound #12 Sacrum Performed By: Physician Fredirick Maudlin, MD Debridement Type: Debridement Level of Consciousness (Pre-procedure): Awake and Alert Pre-procedure Verification/Time Out Yes - 13:32 Taken: Start Time: 13:32 Pain Control: Other : benzocaine 20% spray T Area Debrided (L x W): otal 1.3 (cm) x 1.3 (cm) = 1.69 (cm) Tissue and other material debrided: Viable, Non-Viable, Slough, Subcutaneous, Slough Level: Skin/Subcutaneous Tissue Debridement  Description: Excisional Instrument: Curette Bleeding: Minimum Hemostasis Achieved: Pressure Procedural Pain: 5 Post Procedural Pain: 0 Response to Treatment: Procedure was tolerated well Level of Consciousness (Post- Awake and Alert procedure): Post Debridement Measurements of Total Wound Length: (cm) 1.3 Stage: Category/Stage II Width: (cm) 1.3 Depth: (cm) 0.1 Volume: (cm) 0.133 Character of Wound/Ulcer Post Debridement: Improved Post Procedure Diagnosis Same as Pre-procedure Electronic Signature(s) Signed: 08/25/2021 2:29:20 PM By: Fredirick Maudlin MD FACS Signed: 08/25/2021 5:55:49 PM By: Adline Peals Entered By: Adline Peals on 08/25/2021 13:33:32 -------------------------------------------------------------------------------- Debridement Details Patient Name: Date of Service: [name]. 08/25/2021 12:45 PM Medical Record Number: [id number] Patient Account Number: [id number] Date of Birth/Sex: Treating RN: 1946/03/06 (76 y.o. Harlow Ohms Primary Care Provider: Bing Matter Other Clinician: Referring Provider: Treating Provider/Extender: Donia Ast in Treatment: 11 Debridement Performed for Assessment: Wound #9 Right,Lateral Knee Performed By: Physician Fredirick Maudlin, MD Debridement Type: Debridement Level of Consciousness (Pre-procedure): Awake and Alert Pre-procedure Verification/Time Out Yes - 13:32 Taken: Start Time: 13:32 Pain Control: Other : benzocaine 20% spray T Area Debrided (L x W): otal 1.2 (cm) x 0.5 (cm) = 0.6 (cm) Tissue and other material debrided: Viable, Non-Viable, Slough, Subcutaneous, Slough Level: Skin/Subcutaneous Tissue Debridement Description: Excisional Instrument: Curette Bleeding: Minimum Hemostasis Achieved: Pressure Procedural Pain: 5 Post Procedural Pain: 0 Response to Treatment: Procedure was tolerated well Level of Consciousness (Post- Awake and  Alert procedure): Post Debridement Measurements of Total Wound Length: (cm) 1.2 Width: (cm) 0.5 Depth: (cm) 0.1 Volume: (cm) 0.047 Character of Wound/Ulcer Post Debridement: Improved Post Procedure Diagnosis Same as Pre-procedure Electronic Signature(s) Signed: 08/25/2021 2:29:20 PM By: Fredirick Maudlin MD FACS Signed: 08/25/2021 5:55:49 PM By: Adline Peals Entered By: Adline Peals on 08/25/2021 13:34:10 -------------------------------------------------------------------------------- Debridement Details Patient Name: Date of Service: [name]. 08/25/2021 12:45 PM Medical Record Number: [id number] Patient Account Number: [id number] Date of Birth/Sex: Treating RN: 1946/03/26 (76 y.o. Harlow Ohms Primary Care Provider:  Bing Matter Other Clinician: Referring Provider: Treating Provider/Extender: Donia Ast in Treatment: 11 Debridement Performed for Assessment: Wound #10 Right,Medial T Great oe Performed By: Physician Fredirick Maudlin, MD Debridement Type: Debridement Severity of Tissue Pre Debridement: Fat layer exposed Level of Consciousness (Pre-procedure): Awake and Alert Pre-procedure Verification/Time Out Yes - 13:32 Taken: Start Time: 13:32 Pain Control: Other : benzocaine 20% spray T Area Debrided (L x W): otal 0.2 (cm) x 0.2 (cm) = 0.04 (cm) Tissue and other material debrided: Non-Viable, Eschar, Skin: Epidermis Level: Skin/Epidermis Debridement Description: Selective/Open Wound Instrument: Curette Bleeding: Minimum Hemostasis Achieved: Pressure Procedural Pain: 5 Post Procedural Pain: 0 Response to Treatment: Procedure was tolerated well Level of Consciousness (Post- Awake and Alert procedure): Post Debridement Measurements of Total Wound Length: (cm) 0.2 Width: (cm) 0.3 Depth: (cm) 0.1 Volume: (cm) 0.005 Character of Wound/Ulcer Post Debridement: Improved Severity of Tissue Post Debridement:  Fat layer exposed Post Procedure Diagnosis Same as Pre-procedure Electronic Signature(s) Signed: 08/25/2021 2:29:20 PM By: Fredirick Maudlin MD FACS Signed: 08/25/2021 5:55:49 PM By: Adline Peals Entered By: Adline Peals on 08/25/2021 13:35:39 -------------------------------------------------------------------------------- Debridement Details Patient Name: Date of Service: [name]. 08/25/2021 12:45 PM Medical Record Number: [id number] Patient Account Number: [id number] Date of Birth/Sex: Treating RN: 05-Nov-1945 (76 y.o. Harlow Ohms Primary Care Provider: Bing Matter Other Clinician: Referring Provider: Treating Provider/Extender: Donia Ast in Treatment: 11 Debridement Performed for Assessment: Wound #3 Left,Anterior Lower Leg Performed By: Physician Fredirick Maudlin, MD Debridement Type: Debridement Severity of Tissue Pre Debridement: Fat layer exposed Level of Consciousness (Pre-procedure): Awake and Alert Pre-procedure Verification/Time Out Yes - 13:32 Taken: Start Time: 13:32 Pain Control: Other : benzocaine 20% spray T Area Debrided (L x W): otal 0.2 (cm) x 0.2 (cm) = 0.04 (cm) Tissue and other material debrided: Non-Viable, Eschar, Skin: Epidermis Level: Skin/Epidermis Debridement Description: Selective/Open Wound Instrument: Curette Bleeding: Minimum Hemostasis Achieved: Pressure Procedural Pain: 5 Post Procedural Pain: 0 Response to Treatment: Procedure was tolerated well Level of Consciousness (Post- Level of Consciousness (Post- Awake and Alert procedure): Post Debridement Measurements of Total Wound Length: (cm) 0.2 Width: (cm) 0.2 Depth: (cm) 0.1 Volume: (cm) 0.003 Character of Wound/Ulcer Post Debridement: Improved Severity of Tissue Post Debridement: Fat layer exposed Post Procedure Diagnosis Same as Pre-procedure Electronic Signature(s) Signed: 08/25/2021 2:29:20 PM By: Fredirick Maudlin  MD FACS Signed: 08/25/2021 5:55:49 PM By: Adline Peals Entered By: Adline Peals on 08/25/2021 13:36:10 -------------------------------------------------------------------------------- HPI Details Patient Name: Date of Service: [name]. 08/25/2021 12:45 PM Medical Record Number: [id number] Patient Account Number: [id number] Date of Birth/Sex: Treating RN: 05-Oct-1945 (76 y.o. Harlow Ohms Primary Care Provider: Bing Matter Other Clinician: Referring Provider: Treating Provider/Extender: Donia Ast in Treatment: 11 History of Present Illness HPI Description: ADMISSION 06/09/2021: This is a 76 year old resident of [place]. She was recently hospitalized with sepsis. She had an iliopsoas abscess that was drained. While in the hospital, multiple wounds were identified. There were scattered superficial partial-thickness ulcerations on both lower extremities from the ankle to the knee. There was concern for a possible sacral pressure ulcer and left posterior thigh pressure ulcer. She also had 3 diabetic foot ulcers on the left foot. These involve the DIP joint of the third toe, the DIP joint of the fourth toe, and the plantar surface of the first metatarsal head. Both toe wounds have exposed bone. They have been applying Santyl at her skilled nursing facility. She is here for further evaluation  and management. Her past medical history is notable for history of type 2 diabetes, CVA, stage II CKD, CHF, peripheral vascular disease, history of DVT chronic atrial fibrillation on Xarelto, rheumatoid arthritis. , There has been no imaging performed of her lower extremities. ABIs in clinic today were noncompressible. Hemoglobin A1c on admission to the hospital was acceptable at 5.7%. 08/18/2021: I have not seen the patient since her first visit as she has been in and out of the hospital with multiple issues including multiple episodes of  sepsis and A-fib with RVR as well as altered mental status. During that time, the majority of the wounds that she had at her first visit have healed. She has a tiny wound on her medial right great toe that remains from her previous visits. She has what looks like an abrasion on her right knee as well as similar appearing wounds on her right lateral ankle. She says that she fell and landed on her sacrum earlier today and is in a fair amount of pain because of that. 08/25/2021: She has a new stage II sacral pressure ulcer. It looks like they have been applying Iodosorb at her facility as the area is stained yellow. Many of her small lower extremity wounds have closed. Her right great toenail looks like it is about to fall off. The wound on her right medial great toe is small with a bit of eschar covering it. The wound on her right lateral knee and left anterior tibia have a similar appearance. The other small wounds are no longer open. Electronic Signature(s) Signed: 08/25/2021 2:05:21 PM By: Fredirick Maudlin MD FACS Entered By: Fredirick Maudlin on 08/25/2021 14:05:21 -------------------------------------------------------------------------------- Physical Exam Details Patient Name: Date of Service: [name] 08/25/2021 12:45 PM Medical Record Number: [id number] Patient Account Number: [id number] Date of Birth/Sex: Treating RN: 06/17/1945 (76 y.o. Harlow Ohms Primary Care Provider: Bing Matter Other Clinician: Referring Provider: Treating Provider/Extender: Donia Ast in Treatment: 11 Constitutional .Tachycardic, asymptomatic.. . . No acute distress. Respiratory Normal work of breathing on room air. Notes 08/25/2021: The stage II pressure ulcer on her sacrum has a little bit of slough which is fairly adherent. Most of the other tiny wounds on her lower extremities have closed with the exception of the medial right great toe, the right lateral  knee, and the left anterior tibial surface. These are clean with just a little bit of thin eschar overlying them. Her right great toenail is lifting and looks as though it may fall off. Electronic Signature(s) Signed: 08/25/2021 2:29:20 PM By: Fredirick Maudlin MD FACS Entered By: Fredirick Maudlin on 08/25/2021 14:06:49 -------------------------------------------------------------------------------- Physician Orders Details Patient Name: Date of Service: [name] 08/25/2021 12:45 PM Medical Record Number: [id number] Patient Account Number: [id number] Date of Birth/Sex: Treating RN: 07-04-1945 (77 y.o. Harlow Ohms Primary Care Provider: Bing Matter Other Clinician: Referring Provider: Treating Provider/Extender: Donia Ast in Treatment: 11 Verbal / Phone Orders: No Diagnosis Coding ICD-10 Coding Code Description E11.621 Type 2 diabetes mellitus with foot ulcer E11.622 Type 2 diabetes mellitus with other skin ulcer L89.152 Pressure ulcer of sacral region, stage 2 L97.526 Non-pressure chronic ulcer of other part of left foot with bone involvement without evidence of necrosis I73.9 Peripheral vascular disease, unspecified I87.311 Chronic venous hypertension (idiopathic) with ulcer of right lower extremity I87.312 Chronic venous hypertension (idiopathic) with ulcer of left lower extremity I10 Essential (primary) hypertension K93.81 Chronic diastolic (congestive) heart failure I48.20 Chronic atrial fibrillation,  unspecified I63.9 Cerebral infarction, unspecified Follow-up Appointments ppointment in 1 week. - Dr. Celine Ahr room 2 Return A Bathing/ Shower/ Hygiene May shower with protection but do not get wound dressing(s) wet. - may purchase cast protector from [facility], [facility], [facility] Edema Control - Lymphedema / SCD / Other Elevate legs to the level of the heart or above for 30 minutes daily and/or when sitting, a frequency of: - throughout  the day Moisturize legs daily. Off-Loading Other: - cushion heels with pillow or heel cups, offload and stay off bottom Wound Treatment Wound #10 - T Great oe Wound Laterality: Right, Medial Cleanser: Soap and Water Every Other Day/7 Days Discharge Instructions: May shower and wash wound with dial antibacterial soap and water prior to dressing change. Cleanser: Wound Cleanser Every Other Day/7 Days Discharge Instructions: Cleanse the wound with wound cleanser prior to applying a clean dressing using gauze sponges, not tissue or cotton balls. Peri-Wound Care: Sween Lotion (Moisturizing lotion) Every Other Day/7 Days Discharge Instructions: Apply moisturizing lotion as directed Prim Dressing: KerraCel Ag Gelling Fiber Dressing, 2x2 in (silver alginate) Every Other Day/7 Days ary Discharge Instructions: Apply silver alginate to wound bed as instructed Secondary Dressing: Woven Gauze Sponge, Non-Sterile 4x4 in Every Other Day/7 Days Discharge Instructions: Apply over primary dressing as directed. Compression Wrap: Kerlix Roll 4.5x3.1 (in/yd) Every Other Day/7 Days Discharge Instructions: Apply Kerlix and Coban compression as directed. Compression Wrap: Coban Self-Adherent Wrap 4x5 (in/yd) Every Other Day/7 Days Discharge Instructions: Apply over Kerlix as directed. Wound #12 - Sacrum Cleanser: Soap and Water 1 x Per Day/30 Days Discharge Instructions: May shower and wash wound with dial antibacterial soap and water prior to dressing change. Cleanser: Wound Cleanser 1 x Per Day/30 Days Discharge Instructions: Cleanse the wound with wound cleanser prior to applying a clean dressing using gauze sponges, not tissue or cotton balls. Peri-Wound Care: Zinc Oxide Ointment 30g tube 1 x Per Day/30 Days Discharge Instructions: Apply Zinc Oxide to periwound with each dressing change Prim Dressing: Iodosorb Gel 10 (gm) Tube 1 x Per Day/30 Days ary Discharge Instructions: Apply to wound bed as  instructed Secondary Dressing: Bordered Gauze, 4x4 in 1 x Per Day/30 Days Discharge Instructions: Apply over primary dressing as directed. Secondary Dressing: Woven Gauze Sponge, Non-Sterile 4x4 in 1 x Per Day/30 Days Discharge Instructions: Apply over primary dressing as directed. Wound #3 - Lower Leg Wound Laterality: Left, Anterior Cleanser: Soap and Water Every Other Day/7 Days Discharge Instructions: May shower and wash wound with dial antibacterial soap and water prior to dressing change. Cleanser: Wound Cleanser Every Other Day/7 Days Discharge Instructions: Cleanse the wound with wound cleanser prior to applying a clean dressing using gauze sponges, not tissue or cotton balls. Peri-Wound Care: Sween Lotion (Moisturizing lotion) Every Other Day/7 Days Discharge Instructions: Apply moisturizing lotion as directed Prim Dressing: KerraCel Ag Gelling Fiber Dressing, 2x2 in (silver alginate) Every Other Day/7 Days ary Discharge Instructions: Apply silver alginate to wound bed as instructed Secondary Dressing: Woven Gauze Sponge, Non-Sterile 4x4 in Every Other Day/7 Days Discharge Instructions: Apply over primary dressing as directed. Compression Wrap: Kerlix Roll 4.5x3.1 (in/yd) Every Other Day/7 Days Discharge Instructions: Apply Kerlix and Coban compression as directed. Compression Wrap: Coban Self-Adherent Wrap 4x5 (in/yd) Every Other Day/7 Days Discharge Instructions: Apply over Kerlix as directed. Wound #9 - Knee Wound Laterality: Right, Lateral Cleanser: Soap and Water Every Other Day/30 Days Discharge Instructions: May shower and wash wound with dial antibacterial soap and water prior to dressing change. Prim  Dressing: KerraCel Ag Gelling Fiber Dressing, 2x2 in (silver alginate) Every Other Day/30 Days ary Discharge Instructions: Apply silver alginate to wound bed as instructed Secondary Dressing: Woven Gauze Sponge, Non-Sterile 4x4 in Every Other Day/30 Days Discharge  Instructions: Apply over primary dressing as directed. Secured With: Coban Self-Adherent Wrap 4x5 (in/yd) Every Other Day/30 Days Discharge Instructions: Secure with Coban as directed. Secured With: [employer], 4.5x3.1 (in/yd) Every Other Day/30 Days Discharge Instructions: Secure with Kerlix as directed. Electronic Signature(s) Signed: 08/25/2021 2:29:20 PM By: Fredirick Maudlin MD FACS Entered By: Fredirick Maudlin on 08/25/2021 14:07:12 -------------------------------------------------------------------------------- Problem List Details Patient Name: Date of Service: [name] 08/25/2021 12:45 PM Medical Record Number: [id number] Patient Account Number: [id number] Date of Birth/Sex: Treating RN: 04/01/1946 (76 y.o. Harlow Ohms Primary Care Provider: Bing Matter Other Clinician: Referring Provider: Treating Provider/Extender: Donia Ast in Treatment: 11 Active Problems ICD-10 Encounter Code Description Active Date MDM Diagnosis E11.621 Type 2 diabetes mellitus with foot ulcer 06/09/2021 No Yes E11.622 Type 2 diabetes mellitus with other skin ulcer 06/09/2021 No Yes L89.152 Pressure ulcer of sacral region, stage 2 08/25/2021 No Yes L97.526 Non-pressure chronic ulcer of other part of left foot with bone involvement 06/09/2021 No Yes without evidence of necrosis I73.9 Peripheral vascular disease, unspecified 06/09/2021 No Yes I87.311 Chronic venous hypertension (idiopathic) with ulcer of right lower extremity 06/09/2021 No Yes I87.312 Chronic venous hypertension (idiopathic) with ulcer of left lower extremity 06/09/2021 No Yes I10 Essential (primary) hypertension 06/09/2021 No Yes M76.72 Chronic diastolic (congestive) heart failure 06/09/2021 No Yes I48.20 Chronic atrial fibrillation, unspecified 06/09/2021 No Yes I63.9 Cerebral infarction, unspecified 06/09/2021 No Yes Inactive Problems Resolved Problems Electronic Signature(s) Signed:  08/25/2021 2:03:00 PM By: Fredirick Maudlin MD FACS Entered By: Fredirick Maudlin on 08/25/2021 14:03:00 -------------------------------------------------------------------------------- Progress Note Details Patient Name: Date of Service: [name]. 08/25/2021 12:45 PM Medical Record Number: [id number] Patient Account Number: [id number] Date of Birth/Sex: Treating RN: [birth year] (76 y.o. Harlow Ohms Primary Care Provider: Bing Matter Other Clinician: Referring Provider: Treating Provider/Extender: Donia Ast in Treatment: 11 Subjective Chief Complaint Information obtained from Patient 06/09/2021: Patient is here with multiple lower extremity ulcers on her shins and calves, as well as an open blister on her left heel, ulcers on her third and fourth toes and first metatarsal head on the left foot. She also has an area of reddened skin on her buttock, but this is not open. History of Present Illness (HPI) ADMISSION 06/09/2021: This is a 76 year old resident of [place]. She was recently hospitalized with sepsis. She had an iliopsoas abscess that was drained. While in the hospital, multiple wounds were identified. There were scattered superficial partial-thickness ulcerations on both lower extremities from the ankle to the knee. There was concern for a possible sacral pressure ulcer and left posterior thigh pressure ulcer. She also had 3 diabetic foot ulcers on the left foot. These involve the DIP joint of the third toe, the DIP joint of the fourth toe, and the plantar surface of the first metatarsal head. Both toe wounds have exposed bone. They have been applying Santyl at her skilled nursing facility. She is here for further evaluation and management. Her past medical history is notable for history of type 2 diabetes, CVA, stage II CKD, CHF, peripheral vascular disease, history of DVT chronic atrial fibrillation on Xarelto, rheumatoid  arthritis. , There has been no imaging performed of her lower extremities. ABIs in clinic today were noncompressible.  Hemoglobin A1c on admission to the hospital was acceptable at 5.7%. 08/18/2021: I have not seen the patient since her first visit as she has been in and out of the hospital with multiple issues including multiple episodes of sepsis and A-fib with RVR as well as altered mental status. During that time, the majority of the wounds that she had at her first visit have healed. She has a tiny wound on her medial right great toe that remains from her previous visits. She has what looks like an abrasion on her right knee as well as similar appearing wounds on her right lateral ankle. She says that she fell and landed on her sacrum earlier today and is in a fair amount of pain because of that. 08/25/2021: She has a new stage II sacral pressure ulcer. It looks like they have been applying Iodosorb at her facility as the area is stained yellow. Many of her small lower extremity wounds have closed. Her right great toenail looks like it is about to fall off. The wound on her right medial great toe is small with a bit of eschar covering it. The wound on her right lateral knee and left anterior tibia have a similar appearance. The other small wounds are no longer open. Patient History Unable to Obtain Patient History due to Dementia. Information obtained from Chart. Social History Never smoker, Marital Status - Divorced, Alcohol Use - Never, Drug Use - No History, Caffeine Use - Daily. Medical History Ear/Nose/Mouth/Throat Patient has history of Chronic sinus problems/congestion Hematologic/Lymphatic Patient has history of Anemia - due to CKD Respiratory Patient has history of Chronic Obstructive Pulmonary Disease (COPD) Cardiovascular Patient has history of Congestive Heart Failure, Hypertension, Peripheral Venous Disease Endocrine Patient has history of Type II  Diabetes Genitourinary Denies history of End Stage Renal Disease Neurologic Patient has history of Dementia - Alzheimers, Neuropathy Medical A Surgical History Notes nd Eyes diabetic retinopathy Cardiovascular atherosclerotic heart disease A fib hyperlipedemia Gastrointestinal GERD Genitourinary CKD stage II Neurologic persistent mood disorder, affective vascular dementia Objective Constitutional Tachycardic, asymptomatic.[name] No acute distress. Vitals Time Taken: 12:55 PM, Height: 67 in, Weight: 183 lbs, BMI: 28.7, Temperature: 97.7 F, Pulse: 112 bpm, Respiratory Rate: 20 breaths/min, Blood Pressure: 111/58 mmHg. Respiratory Normal work of breathing on room air. General Notes: 08/25/2021: The stage II pressure ulcer on her sacrum has a little bit of slough which is fairly adherent. Most of the other tiny wounds on her lower extremities have closed with the exception of the medial right great toe, the right lateral knee, and the left anterior tibial surface. These are clean with just a little bit of thin eschar overlying them. Her right great toenail is lifting and looks as though it may fall off. Integumentary (Hair, Skin) Wound #10 status is Open. Original cause of wound was Gradually Appeared. The date acquired was: 08/18/2021. The wound has been in treatment 1 weeks. The wound is located on the Right,Medial T Great. The wound measures 0.2cm length x 0.3cm width x 0.1cm depth; 0.047cm^2 area and 0.005cm^3 volume. oe There is Fat Layer (Subcutaneous Tissue) exposed. There is no tunneling or undermining noted. There is a medium amount of serosanguineous drainage noted. The wound margin is distinct with the outline attached to the wound base. There is small (1-33%) red granulation within the wound bed. There is a large (67-100%) amount of necrotic tissue within the wound bed including Eschar. Wound #11 status is Healed - Epithelialized. Original cause of wound was Shear/Friction. The  date acquired was: 08/18/2021. The wound has been in treatment 1 weeks. The wound is located on the Right,Lateral Ankle. The wound measures 0cm length x 0cm width x 0cm depth; 0cm^2 area and 0cm^3 volume. There is no tunneling or undermining noted. There is a none present amount of drainage noted. The wound margin is distinct with the outline attached to the wound base. There is no granulation within the wound bed. There is no necrotic tissue within the wound bed. Wound #12 status is Open. Original cause of wound was Pressure Injury. The date acquired was: 08/15/2021. The wound is located on the Sacrum. The wound measures 1.3cm length x 1.3cm width x 0.1cm depth; 1.327cm^2 area and 0.133cm^3 volume. There is Fat Layer (Subcutaneous Tissue) exposed. There is no tunneling or undermining noted. There is a medium amount of serosanguineous drainage noted. The wound margin is distinct with the outline attached to the wound base. There is medium (34-66%) red, pink granulation within the wound bed. There is a medium (34-66%) amount of necrotic tissue within the wound bed including Adherent Slough. Wound #3 status is Open. Original cause of wound was Gradually Appeared. The date acquired was: 05/05/2021. The wound has been in treatment 11 weeks. The wound is located on the Left,Anterior Lower Leg. The wound measures 0.2cm length x 0.2cm width x 0.1cm depth; 0.031cm^2 area and 0.003cm^3 volume. There is Fat Layer (Subcutaneous Tissue) exposed. There is no tunneling or undermining noted. There is a medium amount of serosanguineous drainage noted. The wound margin is flat and intact. There is small (1-33%) red granulation within the wound bed. There is a large (67-100%) amount of necrotic tissue within the wound bed including Eschar. Wound #8 status is Healed - Epithelialized. Original cause of wound was Pressure Injury. The date acquired was: 06/09/2021. The wound has been in treatment 11 weeks. The wound is located  on the Left Calcaneus. The wound measures 0cm length x 0cm width x 0cm depth; 0cm^2 area and 0cm^3 volume. There is no tunneling or undermining noted. There is a none present amount of drainage noted. The wound margin is flat and intact. There is no granulation within the wound bed. There is no necrotic tissue within the wound bed. Wound #9 status is Open. Original cause of wound was Shear/Friction. The date acquired was: 08/18/2021. The wound has been in treatment 1 weeks. The wound is located on the Right,Lateral Knee. The wound measures 1.2cm length x 0.5cm width x 0.1cm depth; 0.471cm^2 area and 0.047cm^3 volume. There is Fat Layer (Subcutaneous Tissue) exposed. There is no tunneling or undermining noted. There is a none present amount of drainage noted. The wound margin is distinct with the outline attached to the wound base. There is small (1-33%) red granulation within the wound bed. There is a large (67-100%) amount of necrotic tissue within the wound bed including Eschar. Assessment Active Problems ICD-10 Type 2 diabetes mellitus with foot ulcer Type 2 diabetes mellitus with other skin ulcer Pressure ulcer of sacral region, stage 2 Non-pressure chronic ulcer of other part of left foot with bone involvement without evidence of necrosis Peripheral vascular disease, unspecified Chronic venous hypertension (idiopathic) with ulcer of right lower extremity Chronic venous hypertension (idiopathic) with ulcer of left lower extremity Essential (primary) hypertension Chronic diastolic (congestive) heart failure Chronic atrial fibrillation, unspecified Cerebral infarction, unspecified Procedures Wound #10 Pre-procedure diagnosis of Wound #10 is a Diabetic Wound/Ulcer of the Lower Extremity located on the Right,Medial T Great .Severity of Tissue Pre oe Debridement  is: Fat layer exposed. There was a Selective/Open Wound Skin/Epidermis Debridement with a total area of 0.04 sq cm performed by  Fredirick Maudlin, MD. With the following instrument(s): Curette to remove Non-Viable tissue/material. Material removed includes Eschar and Skin: Epidermis and after achieving pain control using Other (benzocaine 20% spray). No specimens were taken. A time out was conducted at 13:32, prior to the start of the procedure. A Minimum amount of bleeding was controlled with Pressure. The procedure was tolerated well with a pain level of 5 throughout and a pain level of 0 following the procedure. Post Debridement Measurements: 0.2cm length x 0.3cm width x 0.1cm depth; 0.005cm^3 volume. Character of Wound/Ulcer Post Debridement is improved. Severity of Tissue Post Debridement is: Fat layer exposed. Post procedure Diagnosis Wound #10: Same as Pre-Procedure Wound #12 Pre-procedure diagnosis of Wound #12 is a Pressure Ulcer located on the Sacrum . There was a Excisional Skin/Subcutaneous Tissue Debridement with a total area of 1.69 sq cm performed by Fredirick Maudlin, MD. With the following instrument(s): Curette to remove Viable and Non-Viable tissue/material. Material removed includes Subcutaneous Tissue and Slough and after achieving pain control using Other (benzocaine 20% spray). No specimens were taken. A time out was conducted at 13:32, prior to the start of the procedure. A Minimum amount of bleeding was controlled with Pressure. The procedure was tolerated well with a pain level of 5 throughout and a pain level of 0 following the procedure. Post Debridement Measurements: 1.3cm length x 1.3cm width x 0.1cm depth; 0.133cm^3 volume. Post debridement Stage noted as Category/Stage II. Character of Wound/Ulcer Post Debridement is improved. Post procedure Diagnosis Wound #12: Same as Pre-Procedure Wound #3 Pre-procedure diagnosis of Wound #3 is a Diabetic Wound/Ulcer of the Lower Extremity located on the Left,Anterior Lower Leg .Severity of Tissue Pre Debridement is: Fat layer exposed. There was a  Selective/Open Wound Skin/Epidermis Debridement with a total area of 0.04 sq cm performed by Fredirick Maudlin, MD. With the following instrument(s): Curette to remove Non-Viable tissue/material. Material removed includes Eschar and Skin: Epidermis and after achieving pain control using Other (benzocaine 20% spray). No specimens were taken. A time out was conducted at 13:32, prior to the start of the procedure. A Minimum amount of bleeding was controlled with Pressure. The procedure was tolerated well with a pain level of 5 throughout and a pain level of 0 following the procedure. Post Debridement Measurements: 0.2cm length x 0.2cm width x 0.1cm depth; 0.003cm^3 volume. Character of Wound/Ulcer Post Debridement is improved. Severity of Tissue Post Debridement is: Fat layer exposed. Post procedure Diagnosis Wound #3: Same as Pre-Procedure Wound #9 Pre-procedure diagnosis of Wound #9 is an Abrasion located on the Right,Lateral Knee . There was a Excisional Skin/Subcutaneous Tissue Debridement with a total area of 0.6 sq cm performed by Fredirick Maudlin, MD. With the following instrument(s): Curette to remove Viable and Non-Viable tissue/material. Material removed includes Subcutaneous Tissue and Slough and after achieving pain control using Other (benzocaine 20% spray). No specimens were taken. A time out was conducted at 13:32, prior to the start of the procedure. A Minimum amount of bleeding was controlled with Pressure. The procedure was tolerated well with a pain level of 5 throughout and a pain level of 0 following the procedure. Post Debridement Measurements: 1.2cm length x 0.5cm width x 0.1cm depth; 0.047cm^3 volume. Character of Wound/Ulcer Post Debridement is improved. Post procedure Diagnosis Wound #9: Same as Pre-Procedure Plan Follow-up Appointments: Return Appointment in 1 week. - Dr. Celine Ahr room 2 Bathing/ Shower/  Hygiene: May shower with protection but do not get wound dressing(s)  wet. - may purchase cast protector from [facility], [facility], [facility] Edema Control - Lymphedema / SCD / Other: Elevate legs to the level of the heart or above for 30 minutes daily and/or when sitting, a frequency of: - throughout the day Moisturize legs daily. Off-Loading: Other: - cushion heels with pillow or heel cups, offload and stay off bottom WOUND #10: - T Great Wound Laterality: Right, Medial oe Cleanser: Soap and Water Every Other Day/7 Days Discharge Instructions: May shower and wash wound with dial antibacterial soap and water prior to dressing change. Cleanser: Wound Cleanser Every Other Day/7 Days Discharge Instructions: Cleanse the wound with wound cleanser prior to applying a clean dressing using gauze sponges, not tissue or cotton balls. Peri-Wound Care: Sween Lotion (Moisturizing lotion) Every Other Day/7 Days Discharge Instructions: Apply moisturizing lotion as directed Prim Dressing: KerraCel Ag Gelling Fiber Dressing, 2x2 in (silver alginate) Every Other Day/7 Days ary Discharge Instructions: Apply silver alginate to wound bed as instructed Secondary Dressing: Woven Gauze Sponge, Non-Sterile 4x4 in Every Other Day/7 Days Discharge Instructions: Apply over primary dressing as directed. Com pression Wrap: Kerlix Roll 4.5x3.1 (in/yd) Every Other Day/7 Days Discharge Instructions: Apply Kerlix and Coban compression as directed. Com pression Wrap: Coban Self-Adherent Wrap 4x5 (in/yd) Every Other Day/7 Days Discharge Instructions: Apply over Kerlix as directed. WOUND #12: - Sacrum Wound Laterality: Cleanser: Soap and Water 1 x Per Day/30 Days Discharge Instructions: May shower and wash wound with dial antibacterial soap and water prior to dressing change. Cleanser: Wound Cleanser 1 x Per Day/30 Days Discharge Instructions: Cleanse the wound with wound cleanser prior to applying a clean dressing using gauze sponges, not tissue or cotton balls. Peri-Wound Care: Zinc Oxide Ointment  30g tube 1 x Per Day/30 Days Discharge Instructions: Apply Zinc Oxide to periwound with each dressing change Prim Dressing: Iodosorb Gel 10 (gm) Tube 1 x Per Day/30 Days ary Discharge Instructions: Apply to wound bed as instructed Secondary Dressing: Bordered Gauze, 4x4 in 1 x Per Day/30 Days Discharge Instructions: Apply over primary dressing as directed. Secondary Dressing: Woven Gauze Sponge, Non-Sterile 4x4 in 1 x Per Day/30 Days Discharge Instructions: Apply over primary dressing as directed. WOUND #3: - Lower Leg Wound Laterality: Left, Anterior Cleanser: Soap and Water Every Other Day/7 Days Discharge Instructions: May shower and wash wound with dial antibacterial soap and water prior to dressing change. Cleanser: Wound Cleanser Every Other Day/7 Days Discharge Instructions: Cleanse the wound with wound cleanser prior to applying a clean dressing using gauze sponges, not tissue or cotton balls. Peri-Wound Care: Sween Lotion (Moisturizing lotion) Every Other Day/7 Days Discharge Instructions: Apply moisturizing lotion as directed Prim Dressing: KerraCel Ag Gelling Fiber Dressing, 2x2 in (silver alginate) Every Other Day/7 Days ary Discharge Instructions: Apply silver alginate to wound bed as instructed Secondary Dressing: Woven Gauze Sponge, Non-Sterile 4x4 in Every Other Day/7 Days Discharge Instructions: Apply over primary dressing as directed. Com pression Wrap: Kerlix Roll 4.5x3.1 (in/yd) Every Other Day/7 Days Discharge Instructions: Apply Kerlix and Coban compression as directed. Com pression Wrap: Coban Self-Adherent Wrap 4x5 (in/yd) Every Other Day/7 Days Discharge Instructions: Apply over Kerlix as directed. WOUND #9: - Knee Wound Laterality: Right, Lateral Cleanser: Soap and Water Every Other Day/30 Days Discharge Instructions: May shower and wash wound with dial antibacterial soap and water prior to dressing change. Prim Dressing: KerraCel Ag Gelling Fiber Dressing, 2x2  in (silver alginate) Every Other Day/30 Days ary  Discharge Instructions: Apply silver alginate to wound bed as instructed Secondary Dressing: Woven Gauze Sponge, Non-Sterile 4x4 in Every Other Day/30 Days Discharge Instructions: Apply over primary dressing as directed. Secured With: Coban Self-Adherent Wrap 4x5 (in/yd) Every Other Day/30 Days Discharge Instructions: Secure with Coban as directed. Secured With: [employer], 4.5x3.1 (in/yd) Every Other Day/30 Days Discharge Instructions: Secure with Kerlix as directed. 08/25/2021: The stage II pressure ulcer on her sacrum has a little bit of slough which is fairly adherent. Most of the other tiny wounds on her lower extremities have closed with the exception of the medial right great toe, the right lateral knee, and the left anterior tibial surface. These are clean with just a little bit of thin eschar overlying them. Her right great toenail is lifting and looks as though it may fall off. I debrided the thin eschar from the open small wounds on her lower extremities. I also debrided slough off of her new sacral ulcer. We will use Iodosorb to be changed daily to the sacral ulcer and every other day silver alginate with Kerlix and Coban compression to her lower extremity wounds. She was encouraged to improve her protein intake. I also recommended that a podiatrist see her while she is at her facility to address her right great toenail. She needs to offload her sacrum is much as possible both while sitting and supine. Follow-up in 1 week. Electronic Signature(s) Signed: 08/25/2021 2:08:18 PM By: Fredirick Maudlin MD FACS Entered By: Fredirick Maudlin on 08/25/2021 14:08:18 -------------------------------------------------------------------------------- HxROS Details Patient Name: Date of Service: [name]. 08/25/2021 12:45 PM Medical Record Number: [id number] Patient Account Number: [id number] Date of Birth/Sex: Treating RN: October 19, 1945  (76 y.o. Harlow Ohms Primary Care Provider: Bing Matter Other Clinician: Referring Provider: Treating Provider/Extender: Donia Ast in Treatment: 11 Unable to Obtain Patient History due to Dementia Information Obtained From Chart Eyes Medical History: Past Medical History Notes: diabetic retinopathy Ear/Nose/Mouth/Throat Medical History: Positive for: Chronic sinus problems/congestion Hematologic/Lymphatic Medical History: Positive for: Anemia - due to CKD Respiratory Medical History: Positive for: Chronic Obstructive Pulmonary Disease (COPD) Cardiovascular Medical History: Positive for: Congestive Heart Failure; Hypertension; Peripheral Venous Disease Past Medical History Notes: atherosclerotic heart disease A fib hyperlipedemia Gastrointestinal Medical History: Past Medical History Notes: GERD Endocrine Medical History: Positive for: Type II Diabetes Treated with: Insulin, Oral agents Blood sugar tested every day: Yes Tested : Genitourinary Medical History: Negative for: End Stage Renal Disease Past Medical History Notes: CKD stage II Neurologic Medical History: Positive for: Dementia - Alzheimers; Neuropathy Past Medical History Notes: persistent mood disorder, affective vascular dementia HBO Extended History Items Ear/Nose/Mouth/Throat: Chronic sinus problems/congestion Immunizations Pneumococcal Vaccine: Received Pneumococcal Vaccination: Yes Received Pneumococcal Vaccination On or After 60th Birthday: Yes Implantable Devices None Family and Social History Never smoker; Marital Status - Divorced; Alcohol Use: Never; Drug Use: No History; Caffeine Use: Daily; Financial Concerns: No; Food, Clothing or Shelter Needs: No; Support System Lacking: No; Transportation Concerns: No Electronic Signature(s) Signed: 08/25/2021 2:29:20 PM By: Fredirick Maudlin MD FACS Signed: 08/25/2021 5:55:49 PM By: Sabas Sous By: Fredirick Maudlin on 08/25/2021 14:05:27 -------------------------------------------------------------------------------- SuperBill Details Patient Name: Date of Service: [name] 08/25/2021 Medical Record Number: [id number] Patient Account Number: [id number] Date of Birth/Sex: Treating RN: [birth year] (75 y.o. 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 of necrosis I73.9 Peripheral vascular disease, unspecified I87.311 Chronic venous hypertension (idiopathic) with ulcer of right lower extremity I87.312 Chronic venous hypertension (idiopathic) with ulcer of left lower extremity I10 Essential (primary) hypertension W26.37 Chronic diastolic (congestive) heart failure I48.20 Chronic atrial fibrillation, unspecified I63.9 Cerebral infarction, unspecified Facility Procedures CPT4 Code: [id number] Description: 74128 - DEB SUBQ TISSUE 20 SQ CM/< ICD-10 Diagnosis Description L89.152 Pressure ulcer of sacral region, stage 2 Modifier: Quantity: 1 CPT4 Code: [id number] Description: 94709 - DEBRIDE WOUND 1ST 20 SQ CM OR < ICD-10 Diagnosis Description L97.526 Non-pressure chronic ulcer of other part of left foot with bone involvement witho E11.622 Type 2 diabetes mellitus with other skin ulcer Modifier: ut evidence of necr Quantity: 1 osis Physician Procedures : CPT4 Code Description Modifier [id number] - WC PHYS LEVEL 3 - EST PT 25 ICD-10 Diagnosis Description L89.152 Pressure ulcer of sacral region, stage 2 L97.526 Non-pressure  chronic ulcer of other part of left foot with bone involvement without  evidence of necro E11.622 Type 2 diabetes mellitus with other skin ulcer E11.621 Type 2 diabetes mellitus with foot ulcer Quantity: 1 sis : [id number] 11042 - WC PHYS SUBQ TISS 20 SQ CM ICD-10 Diagnosis Description L89.152 Pressure ulcer of sacral region, stage 2 Quantity: 1 : [id number] - WC PHYS DEBR WO ANESTH 20 SQ CM ICD-10 Diagnosis Description L97.526 Non-pressure chronic ulcer of other part of left foot with bone involvement without evidence of necro E11.622 Type 2 diabetes mellitus with other skin ulcer Quantity: 1 sis Electronic Signature(s) Signed: 08/25/2021 2:09:07 PM By: Fredirick Maudlin MD FACS Entered By: Fredirick Maudlin on 08/25/2021 14:09:06

## 2021-08-31 ENCOUNTER — Telehealth: Payer: Self-pay | Admitting: *Deleted

## 2021-08-31 NOTE — Telephone Encounter (Signed)
-----   Message from Lelon Perla, MD sent at 08/31/2021 11:37 AM EDT ----- Atrial fibrillation with rate elevated.  Increase metoprolol to 50 mg twice daily. Kirk Ruths  ----- Message ----- From: Lelon Perla, MD Sent: 08/31/2021  11:35 AM EDT To: Lelon Perla, MD

## 2021-08-31 NOTE — Telephone Encounter (Signed)
Unable to reach pt or leave a message  

## 2021-09-02 ENCOUNTER — Encounter (HOSPITAL_BASED_OUTPATIENT_CLINIC_OR_DEPARTMENT_OTHER): Payer: 59 | Attending: General Surgery | Admitting: General Surgery

## 2021-09-02 DIAGNOSIS — L97526 Non-pressure chronic ulcer of other part of left foot with bone involvement without evidence of necrosis: Secondary | ICD-10-CM | POA: Insufficient documentation

## 2021-09-02 DIAGNOSIS — E1151 Type 2 diabetes mellitus with diabetic peripheral angiopathy without gangrene: Secondary | ICD-10-CM | POA: Insufficient documentation

## 2021-09-02 DIAGNOSIS — I482 Chronic atrial fibrillation, unspecified: Secondary | ICD-10-CM | POA: Insufficient documentation

## 2021-09-02 DIAGNOSIS — N182 Chronic kidney disease, stage 2 (mild): Secondary | ICD-10-CM | POA: Insufficient documentation

## 2021-09-02 DIAGNOSIS — I639 Cerebral infarction, unspecified: Secondary | ICD-10-CM | POA: Insufficient documentation

## 2021-09-02 DIAGNOSIS — M069 Rheumatoid arthritis, unspecified: Secondary | ICD-10-CM | POA: Insufficient documentation

## 2021-09-02 DIAGNOSIS — I130 Hypertensive heart and chronic kidney disease with heart failure and stage 1 through stage 4 chronic kidney disease, or unspecified chronic kidney disease: Secondary | ICD-10-CM | POA: Diagnosis not present

## 2021-09-02 DIAGNOSIS — E11622 Type 2 diabetes mellitus with other skin ulcer: Secondary | ICD-10-CM | POA: Diagnosis not present

## 2021-09-02 DIAGNOSIS — E11621 Type 2 diabetes mellitus with foot ulcer: Secondary | ICD-10-CM | POA: Insufficient documentation

## 2021-09-02 DIAGNOSIS — Z86718 Personal history of other venous thrombosis and embolism: Secondary | ICD-10-CM | POA: Insufficient documentation

## 2021-09-02 DIAGNOSIS — Z7901 Long term (current) use of anticoagulants: Secondary | ICD-10-CM | POA: Insufficient documentation

## 2021-09-02 DIAGNOSIS — L89152 Pressure ulcer of sacral region, stage 2: Secondary | ICD-10-CM | POA: Diagnosis not present

## 2021-09-02 DIAGNOSIS — I5032 Chronic diastolic (congestive) heart failure: Secondary | ICD-10-CM | POA: Diagnosis not present

## 2021-09-02 DIAGNOSIS — I87313 Chronic venous hypertension (idiopathic) with ulcer of bilateral lower extremity: Secondary | ICD-10-CM | POA: Insufficient documentation

## 2021-09-02 DIAGNOSIS — E1122 Type 2 diabetes mellitus with diabetic chronic kidney disease: Secondary | ICD-10-CM | POA: Insufficient documentation

## 2021-09-02 NOTE — Progress Notes (Signed)
PETRINA, MELBY ([id number]) Visit Report for 09/02/2021 Chief Complaint Document Details Patient Name: Date of Service: [name], [name] 09/02/2021 12:45 PM Medical Record Number: [id number] Patient Account Number: [id number] Date of Birth/Sex: Treating RN: 1945/09/19 (77 y.o. Harlow Ohms Primary Care Provider: Bing Matter Other Clinician: Referring Provider: Treating Provider/Extender: Donia Ast in Treatment: 12 Information Obtained from: Patient Chief Complaint 06/09/2021: Patient is here with multiple lower extremity ulcers on her shins and calves, as well as an open blister on her left heel, ulcers on her third and fourth toes and first metatarsal head on the left foot. She also has an area of reddened skin on her buttock, but this is not open. Electronic Signature(s) Signed: 09/02/2021 1:42:53 PM By: Fredirick Maudlin MD FACS Entered By: Fredirick Maudlin on 09/02/2021 13:42:53 -------------------------------------------------------------------------------- Debridement Details Patient Name: Date of Service: [name] 09/02/2021 12:45 PM Medical Record Number: [id number] Patient Account Number: [id number] Date of Birth/Sex: Treating RN: 1945/09/02 (76 y.o. Harlow Ohms Primary Care Provider: Bing Matter Other Clinician: Referring Provider: Treating Provider/Extender: Donia Ast in Treatment: 12 Debridement Performed for Assessment: Wound #10 Right,Medial T Great oe Performed By: Physician Fredirick Maudlin, MD Debridement Type: Debridement Severity of Tissue Pre Debridement: Fat layer exposed Level of Consciousness (Pre-procedure): Awake and Alert Pre-procedure Verification/Time Out Yes - 13:14 Taken: Start Time: 13:14 Pain Control: Other : benzocaine 20% T Area Debrided (L x W): otal 0.5 (cm) x 0.3 (cm) = 0.15 (cm) Tissue and other material debrided: Non-Viable, Eschar, Skin:  Epidermis Level: Skin/Epidermis Debridement Description: Selective/Open Wound Instrument: Curette Bleeding: Minimum Hemostasis Achieved: Pressure Procedural Pain: 0 Post Procedural Pain: 0 Response to Treatment: Procedure was tolerated well Level of Consciousness (Post- Awake and Alert procedure): Post Debridement Measurements of Total Wound Length: (cm) 0.5 Width: (cm) 0.3 Depth: (cm) 0.1 Volume: (cm) 0.012 Character of Wound/Ulcer Post Debridement: Improved Severity of Tissue Post Debridement: Fat layer exposed Post Procedure Diagnosis Same as Pre-procedure Electronic Signature(s) Signed: 09/02/2021 4:49:27 PM By: Fredirick Maudlin MD FACS Signed: 09/02/2021 5:16:25 PM By: Adline Peals Entered By: Adline Peals on 09/02/2021 13:15:17 -------------------------------------------------------------------------------- Debridement Details Patient Name: Date of Service: [name]. 09/02/2021 12:45 PM Medical Record Number: [id number] Patient Account Number: [id number] Date of Birth/Sex: Treating RN: 1945/06/18 (76 y.o. Harlow Ohms Primary Care Provider: Bing Matter Other Clinician: Referring Provider: Treating Provider/Extender: Donia Ast in Treatment: 12 Debridement Performed for Assessment: Wound #9 Right,Lateral Knee Performed By: Physician Fredirick Maudlin, MD Debridement Type: Debridement Level of Consciousness (Pre-procedure): Awake and Alert Pre-procedure Verification/Time Out Yes - 13:14 Taken: Start Time: 13:14 Pain Control: Other : benzocaine 20% T Area Debrided (L x W): otal 1 (cm) x 0.5 (cm) = 0.5 (cm) Tissue and other material debrided: Non-Viable, Eschar, Skin: Epidermis Level: Skin/Epidermis Debridement Description: Selective/Open Wound Instrument: Curette Bleeding: Minimum Hemostasis Achieved: Pressure Procedural Pain: 0 Post Procedural Pain: 0 Response to Treatment: Procedure was tolerated  well Level of Consciousness (Post- Awake and Alert procedure): Post Debridement Measurements of Total Wound Length: (cm) 1 Width: (cm) 0.5 Depth: (cm) 0.1 Volume: (cm) 0.039 Character of Wound/Ulcer Post Debridement: Improved Post Procedure Diagnosis Same as Pre-procedure Electronic Signature(s) Signed: 09/02/2021 4:49:27 PM By: Fredirick Maudlin MD FACS Signed: 09/02/2021 5:16:25 PM By: Adline Peals Entered By: Adline Peals on 09/02/2021 13:16:17 -------------------------------------------------------------------------------- Debridement Details Patient Name: Date of Service: [name]. 09/02/2021 12:45 PM Medical Record Number: [id number] Patient Account Number: [id number] Date of  Birth/Sex: Treating RN: Apr 29, 1945 (76 y.o. Harlow Ohms Primary Care Provider: Other Clinician: Bing Matter Referring Provider: Treating Provider/Extender: Donia Ast in Treatment: 12 Debridement Performed for Assessment: Wound #12 Sacrum Performed By: Physician Fredirick Maudlin, MD Debridement Type: Debridement Level of Consciousness (Pre-procedure): Awake and Alert Pre-procedure Verification/Time Out Yes - 13:14 Taken: Start Time: 13:14 Pain Control: Other : benzocaine 20% T Area Debrided (L x W): otal 1.2 (cm) x 0.8 (cm) = 0.96 (cm) Tissue and other material debrided: Viable, Non-Viable, Slough, Subcutaneous, Slough Level: Skin/Subcutaneous Tissue Debridement Description: Excisional Instrument: Curette Bleeding: Minimum Hemostasis Achieved: Pressure Procedural Pain: 0 Post Procedural Pain: 0 Response to Treatment: Procedure was tolerated well Level of Consciousness (Post- Awake and Alert procedure): Post Debridement Measurements of Total Wound Length: (cm) 1.2 Stage: Category/Stage II Width: (cm) 0.8 Depth: (cm) 0.1 Volume: (cm) 0.075 Character of Wound/Ulcer Post Debridement: Improved Post Procedure Diagnosis Same as  Pre-procedure Electronic Signature(s) Signed: 09/02/2021 4:49:27 PM By: Fredirick Maudlin MD FACS Signed: 09/02/2021 5:16:25 PM By: Adline Peals Entered By: Adline Peals on 09/02/2021 13:17:07 -------------------------------------------------------------------------------- Debridement Details Patient Name: Date of Service: [name]. 09/02/2021 12:45 PM Medical Record Number: [id number] Patient Account Number: [id number] Date of Birth/Sex: Treating RN: 03-Mar-1946 (76 y.o. Harlow Ohms Primary Care Provider: Bing Matter Other Clinician: Referring Provider: Treating Provider/Extender: Donia Ast in Treatment: 12 Debridement Performed for Assessment: Wound #13 Right,Lateral Foot Performed By: Physician Fredirick Maudlin, MD Debridement Type: Debridement Level of Consciousness (Pre-procedure): Awake and Alert Pre-procedure Verification/Time Out Yes - 13:14 Taken: Start Time: 13:14 Pain Control: Other : benzocaine 20% T Area Debrided (L x W): otal 0.7 (cm) x 0.5 (cm) = 0.35 (cm) Tissue and other material debrided: Non-Viable, Eschar, Skin: Epidermis Level: Skin/Epidermis Debridement Description: Selective/Open Wound Instrument: Curette Bleeding: Minimum Hemostasis Achieved: Pressure Procedural Pain: 0 Post Procedural Pain: 0 Response to Treatment: Procedure was tolerated well Level of Consciousness (Post- Awake and Alert procedure): Post Debridement Measurements of Total Wound Length: (cm) 0.7 Width: (cm) 0.5 Depth: (cm) 0.1 Volume: (cm) 0.027 Character of Wound/Ulcer Post Debridement: Improved Post Procedure Diagnosis Same as Pre-procedure Electronic Signature(s) Signed: 09/02/2021 4:49:27 PM By: Fredirick Maudlin MD FACS Signed: 09/02/2021 5:16:25 PM By: Adline Peals Entered By: Adline Peals on 09/02/2021 13:22:40 -------------------------------------------------------------------------------- Debridement  Details Patient Name: Date of Service: [name]. 09/02/2021 12:45 PM Medical Record Number: [id number] Patient Account Number: [id number] Date of Birth/Sex: Treating RN: 05-23-1945 (76 y.o. Harlow Ohms Primary Care Provider: Bing Matter Other Clinician: Referring Provider: Treating Provider/Extender: Donia Ast in Treatment: 12 Debridement Performed for Assessment: Wound #14 Left Metatarsal head first Performed By: Physician Fredirick Maudlin, MD Debridement Type: Debridement Level of Consciousness (Pre-procedure): Awake and Alert Pre-procedure Verification/Time Out Yes - 13:14 Taken: Start Time: 13:14 Pain Control: Other : benzocaine 20% T Area Debrided (L x W): otal 0.5 (cm) x 0.6 (cm) = 0.3 (cm) Tissue and other material debrided: Non-Viable, Eschar, Skin: Epidermis Level: Skin/Epidermis Debridement Description: Selective/Open Wound Instrument: Curette Bleeding: Minimum Hemostasis Achieved: Pressure Procedural Pain: 0 Post Procedural Pain: 0 Response to Treatment: Procedure was tolerated well Level of Consciousness (Post- Awake and Alert procedure): Post Debridement Measurements of Total Wound Length: (cm) 0.5 Width: (cm) 0.6 Depth: (cm) 0.1 Volume: (cm) 0.024 Character of Wound/Ulcer Post Debridement: Improved Post Procedure Diagnosis Same as Pre-procedure Electronic Signature(s) Signed: 09/02/2021 4:49:27 PM By: Fredirick Maudlin MD FACS Signed: 09/02/2021 5:16:25 PM By: Adline Peals Entered By: Adline Peals  on 09/02/2021 13:23:00 -------------------------------------------------------------------------------- HPI Details Patient Name: Date of Service: [name], [name] 09/02/2021 12:45 PM Medical Record Number: [id number] Patient Account Number: [id number] Date of Birth/Sex: Treating RN: 1945/06/13 (76 y.o. Harlow Ohms Primary Care Provider: Bing Matter Other Clinician: Referring  Provider: Treating Provider/Extender: Donia Ast in Treatment: 12 History of Present Illness HPI Description: ADMISSION 06/09/2021: This is a 76 year old resident of [place]. She was recently hospitalized with sepsis. She had an iliopsoas abscess that was drained. While in the hospital, multiple wounds were identified. There were scattered superficial partial-thickness ulcerations on both lower extremities from the ankle to the knee. There was concern for a possible sacral pressure ulcer and left posterior thigh pressure ulcer. She also had 3 diabetic foot ulcers on the left foot. These involve the DIP joint of the third toe, the DIP joint of the fourth toe, and the plantar surface of the first metatarsal head. Both toe wounds have exposed bone. They have been applying Santyl at her skilled nursing facility. She is here for further evaluation and management. Her past medical history is notable for history of type 2 diabetes, CVA, stage II CKD, CHF, peripheral vascular disease, history of DVT chronic atrial fibrillation on Xarelto, rheumatoid arthritis. , There has been no imaging performed of her lower extremities. ABIs in clinic today were noncompressible. Hemoglobin A1c on admission to the hospital was acceptable at 5.7%. 08/18/2021: I have not seen the patient since her first visit as she has been in and out of the hospital with multiple issues including multiple episodes of sepsis and A-fib with RVR as well as altered mental status. During that time, the majority of the wounds that she had at her first visit have healed. She has a tiny wound on her medial right great toe that remains from her previous visits. She has what looks like an abrasion on her right knee as well as similar appearing wounds on her right lateral ankle. She says that she fell and landed on her sacrum earlier today and is in a fair amount of pain because of that. 08/25/2021: She has a new stage  II sacral pressure ulcer. It looks like they have been applying Iodosorb at her facility as the area is stained yellow. Many of her small lower extremity wounds have closed. Her right great toenail looks like it is about to fall off. The wound on her right medial great toe is small with a bit of eschar covering it. The wound on her right lateral knee and left anterior tibia have a similar appearance. The other small wounds are no longer open. 09/02/2021: There is a little bit of slough accumulation on the sacral pressure ulcer. There is scabbing/eschar on her right great toe, right knee, and right lateral ankle. She also has a blood blister on her left first metatarsal head that looks like there may be open tissue underneath this. Electronic Signature(s) Signed: 09/02/2021 1:43:56 PM By: Fredirick Maudlin MD FACS Entered By: Fredirick Maudlin on 09/02/2021 13:43:56 -------------------------------------------------------------------------------- Physical Exam Details Patient Name: Date of Service: [name] 09/02/2021 12:45 PM Medical Record Number: [id number] Patient Account Number: [id number] Date of Birth/Sex: Treating RN: 1945-09-07 (75 y.o. Harlow Ohms Primary Care Provider: Bing Matter Other Clinician: Referring Provider: Treating Provider/Extender: Donia Ast in Treatment: 12 Constitutional Slightly hypotensive, asymptomatic.. Tachycardic, irregular rhythm.. . . No acute distress. Respiratory Normal work of breathing on room air. Notes 09/02/2021: There is a little bit  of slough accumulation on the sacral pressure ulcer. There is scabbing/eschar on her right great toe, right knee, and right lateral ankle. She also has a blood blister on her left first metatarsal head that looks like there may be open tissue underneath this. Electronic Signature(s) Signed: 09/02/2021 1:45:01 PM By: Fredirick Maudlin MD FACS Entered By: Fredirick Maudlin on  09/02/2021 13:45:01 -------------------------------------------------------------------------------- Physician Orders Details Patient Name: Date of Service: [name] 09/02/2021 12:45 PM Medical Record Number: [id number] Patient Account Number: [id number] Date of Birth/Sex: Treating RN: [birth year] (76 y.o. Harlow Ohms Primary Care Provider: Bing Matter Other Clinician: Referring Provider: Treating Provider/Extender: Donia Ast in Treatment: 12 Verbal / Phone Orders: No Diagnosis Coding ICD-10 Coding Code Description E11.621 Type 2 diabetes mellitus with foot ulcer E11.622 Type 2 diabetes mellitus with other skin ulcer L89.152 Pressure ulcer of sacral region, stage 2 L97.526 Non-pressure chronic ulcer of other part of left foot with bone involvement without evidence of necrosis I73.9 Peripheral vascular disease, unspecified I87.311 Chronic venous hypertension (idiopathic) with ulcer of right lower extremity I87.312 Chronic venous hypertension (idiopathic) with ulcer of left lower extremity I10 Essential (primary) hypertension W10.27 Chronic diastolic (congestive) heart failure I48.20 Chronic atrial fibrillation, unspecified I63.9 Cerebral infarction, unspecified Follow-up Appointments ppointment in 1 week. - Dr. Celine Ahr room 2 Return A Bathing/ Shower/ Hygiene May shower with protection but do not get wound dressing(s) wet. - may purchase cast protector from [facility], [facility], [facility] Edema Control - Lymphedema / SCD / Other Elevate legs to the level of the heart or above for 30 minutes daily and/or when sitting, a frequency of: - throughout the day Moisturize legs daily. Off-Loading Other: - cushion heels with pillow or heel cups, offload and stay off bottom Additional Orders / Instructions Wound #10 Right,Medial T Great oe Other: - *****PLEASE SEND TO HER HEART DOCTOR FOR HER ELEVATED HEART RATE*********** Wound #12 Sacrum Other:  - *****PLEASE SEND TO HER HEART DOCTOR FOR HER ELEVATED HEART RATE*********** Wound #13 Right,Lateral Foot Other: - *****PLEASE SEND TO HER HEART DOCTOR FOR HER ELEVATED HEART RATE*********** Wound #14 Left Metatarsal head first Other: - *****PLEASE SEND TO HER HEART DOCTOR FOR HER ELEVATED HEART RATE*********** Wound #9 Right,Lateral Knee Other: - *****PLEASE SEND TO HER HEART DOCTOR FOR HER ELEVATED HEART RATE*********** Wound Treatment Wound #10 - T Great oe Wound Laterality: Right, Medial Cleanser: Soap and Water Every Other Day/7 Days Discharge Instructions: May shower and wash wound with dial antibacterial soap and water prior to dressing change. Cleanser: Wound Cleanser Every Other Day/7 Days Discharge Instructions: Cleanse the wound with wound cleanser prior to applying a clean dressing using gauze sponges, not tissue or cotton balls. Peri-Wound Care: Sween Lotion (Moisturizing lotion) Every Other Day/7 Days Discharge Instructions: Apply moisturizing lotion as directed Prim Dressing: KerraCel Ag Gelling Fiber Dressing, 2x2 in (silver alginate) Every Other Day/7 Days ary Discharge Instructions: Apply silver alginate to wound bed as instructed Secondary Dressing: Woven Gauze Sponge, Non-Sterile 4x4 in Every Other Day/7 Days Discharge Instructions: Apply over primary dressing as directed. Secured With: Child psychotherapist, Sterile 2x75 (in/in) Every Other Day/7 Days Discharge Instructions: Secure with stretch gauze as directed. Secured With: 29M Medipore H Soft Cloth Surgical T ape, 4 x 10 (in/yd) Every Other Day/7 Days Discharge Instructions: Secure with tape as directed. Wound #12 - Sacrum Cleanser: Soap and Water 1 x Per Day/30 Days Discharge Instructions: May shower and wash wound with dial antibacterial soap and water prior to dressing change.  Cleanser: Wound Cleanser 1 x Per Day/30 Days Discharge Instructions: Cleanse the wound with wound cleanser prior to applying  a clean dressing using gauze sponges, not tissue or cotton balls. Peri-Wound Care: Zinc Oxide Ointment 30g tube 1 x Per Day/30 Days Discharge Instructions: Apply Zinc Oxide to periwound with each dressing change Prim Dressing: Iodosorb Gel 10 (gm) Tube 1 x Per Day/30 Days ary Discharge Instructions: Apply to wound bed as instructed Secondary Dressing: Bordered Gauze, 4x4 in 1 x Per Day/30 Days Discharge Instructions: Apply over primary dressing as directed. Secondary Dressing: Woven Gauze Sponge, Non-Sterile 4x4 in 1 x Per Day/30 Days Discharge Instructions: Apply over primary dressing as directed. Wound #13 - Foot Wound Laterality: Right, Lateral Cleanser: Soap and Water Every Other Day/7 Days Discharge Instructions: May shower and wash wound with dial antibacterial soap and water prior to dressing change. Cleanser: Wound Cleanser Every Other Day/7 Days Discharge Instructions: Cleanse the wound with wound cleanser prior to applying a clean dressing using gauze sponges, not tissue or cotton balls. Peri-Wound Care: Sween Lotion (Moisturizing lotion) Every Other Day/7 Days Discharge Instructions: Apply moisturizing lotion as directed Prim Dressing: KerraCel Ag Gelling Fiber Dressing, 2x2 in (silver alginate) Every Other Day/7 Days ary Discharge Instructions: Apply silver alginate to wound bed as instructed Secondary Dressing: Woven Gauze Sponge, Non-Sterile 4x4 in Every Other Day/7 Days Discharge Instructions: Apply over primary dressing as directed. Compression Wrap: Kerlix Roll 4.5x3.1 (in/yd) Every Other Day/7 Days Discharge Instructions: Apply Kerlix and Coban compression as directed. Compression Wrap: Coban Self-Adherent Wrap 4x5 (in/yd) Every Other Day/7 Days Discharge Instructions: Apply over Kerlix as directed. Wound #14 - Metatarsal head first Wound Laterality: Left Cleanser: Soap and Water Every Other Day/7 Days Discharge Instructions: May shower and wash wound with dial  antibacterial soap and water prior to dressing change. Cleanser: Wound Cleanser Every Other Day/7 Days Discharge Instructions: Cleanse the wound with wound cleanser prior to applying a clean dressing using gauze sponges, not tissue or cotton balls. Peri-Wound Care: Sween Lotion (Moisturizing lotion) Every Other Day/7 Days Discharge Instructions: Apply moisturizing lotion as directed Prim Dressing: KerraCel Ag Gelling Fiber Dressing, 2x2 in (silver alginate) Every Other Day/7 Days ary Discharge Instructions: Apply silver alginate to wound bed as instructed Secondary Dressing: Bordered Gauze, 2x2 in Every Other Day/7 Days Discharge Instructions: Apply over primary dressing as directed. Wound #9 - Knee Wound Laterality: Right, Lateral Cleanser: Soap and Water Every Other Day/30 Days Discharge Instructions: May shower and wash wound with dial antibacterial soap and water prior to dressing change. Prim Dressing: KerraCel Ag Gelling Fiber Dressing, 2x2 in (silver alginate) Every Other Day/30 Days ary Discharge Instructions: Apply silver alginate to wound bed as instructed Secondary Dressing: Woven Gauze Sponge, Non-Sterile 4x4 in Every Other Day/30 Days Discharge Instructions: Apply over primary dressing as directed. Secured With: Coban Self-Adherent Wrap 4x5 (in/yd) Every Other Day/30 Days Discharge Instructions: Secure with Coban as directed. Secured With: [employer], 4.5x3.1 (in/yd) Every Other Day/30 Days Discharge Instructions: Secure with Kerlix as directed. Electronic Signature(s) Signed: 09/02/2021 4:49:27 PM By: Fredirick Maudlin MD FACS Entered By: Fredirick Maudlin on 09/02/2021 13:45:24 -------------------------------------------------------------------------------- Problem List Details Patient Name: Date of Service: [name] 09/02/2021 12:45 PM Medical Record Number: [id number] Patient Account Number: [id number] Date of Birth/Sex: Treating RN: 1946/03/19 (76 y.o. 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 06/09/2021 No Yes without evidence of necrosis I73.9 Peripheral vascular disease, unspecified 06/09/2021 No Yes I87.311 Chronic venous hypertension (idiopathic) with ulcer of right lower extremity 06/09/2021 No Yes I87.312 Chronic venous hypertension (idiopathic) with ulcer of left lower extremity 06/09/2021 No Yes I10 Essential (primary) hypertension 06/09/2021 No Yes W09.81 Chronic diastolic (congestive) heart failure 06/09/2021 No Yes I48.20 Chronic atrial fibrillation, unspecified 06/09/2021 No Yes I63.9 Cerebral infarction, unspecified 06/09/2021 No Yes Inactive Problems Resolved Problems Electronic Signature(s) Signed: 09/02/2021 1:41:30 PM By: Fredirick Maudlin MD FACS Entered By: Fredirick Maudlin on 09/02/2021 13:41:30 -------------------------------------------------------------------------------- Progress Note Details Patient Name: Date of Service: [name]. 09/02/2021 12:45 PM Medical Record Number: [id number] Patient Account Number: [id number] Date of Birth/Sex: Treating RN: 1946/02/26 (76 y.o. Harlow Ohms Primary Care Provider: Bing Matter Other Clinician: Referring Provider: Treating Provider/Extender: Donia Ast in Treatment: 12 Subjective Chief Complaint Information obtained from Patient 06/09/2021: Patient is here with multiple lower extremity ulcers on her shins and calves, as well as an open blister  on her left heel, ulcers on her third and fourth toes and first metatarsal head on the left foot. She also has an area of reddened skin on her buttock, but this is not open. History of Present Illness (HPI) ADMISSION 06/09/2021: This is a 76 year old resident of [place]. She was recently hospitalized with sepsis. She had an iliopsoas abscess that was drained. While in the hospital, multiple wounds were identified. There were scattered superficial partial-thickness ulcerations on both lower extremities from the ankle to the knee. There was concern for a possible sacral pressure ulcer and left posterior thigh pressure ulcer. She also had 3 diabetic foot ulcers on the left foot. These involve the DIP joint of the third toe, the DIP joint of the fourth toe, and the plantar surface of the first metatarsal head. Both toe wounds have exposed bone. They have been applying Santyl at her skilled nursing facility. She is here for further evaluation and management. Her past medical history is notable for history of type 2 diabetes, CVA, stage II CKD, CHF, peripheral vascular disease, history of DVT chronic atrial fibrillation on Xarelto, rheumatoid arthritis. , There has been no imaging performed of her lower extremities. ABIs in clinic today were noncompressible. Hemoglobin A1c on admission to the hospital was acceptable at 5.7%. 08/18/2021: I have not seen the patient since her first visit as she has been in and out of the hospital with multiple issues including multiple episodes of sepsis and A-fib with RVR as well as altered mental status. During that time, the majority of the wounds that she had at her first visit have healed. She has a tiny wound on her medial right great toe that remains from her previous visits. She has what looks like an abrasion on her right knee as well as similar appearing wounds on her right lateral ankle. She says that she fell and landed on her sacrum earlier today and is in a  fair amount of pain because of that. 08/25/2021: She has a new stage II sacral pressure ulcer. It looks like they have been applying Iodosorb at her facility as the area is stained yellow. Many of her small lower extremity wounds have  closed. Her right great toenail looks like it is about to fall off. The wound on her right medial great toe is small with a bit of eschar covering it. The wound on her right lateral knee and left anterior tibia have a similar appearance. The other small wounds are no longer open. 09/02/2021: There is a little bit of slough accumulation on the sacral pressure ulcer. There is scabbing/eschar on her right great toe, right knee, and right lateral ankle. She also has a blood blister on her left first metatarsal head that looks like there may be open tissue underneath this. Patient History Unable to Obtain Patient History due to Dementia. Information obtained from Chart. Social History Never smoker, Marital Status - Divorced, Alcohol Use - Never, Drug Use - No History, Caffeine Use - Daily. Medical History Ear/Nose/Mouth/Throat Patient has history of Chronic sinus problems/congestion Hematologic/Lymphatic Patient has history of Anemia - due to CKD Respiratory Patient has history of Chronic Obstructive Pulmonary Disease (COPD) Cardiovascular Patient has history of Congestive Heart Failure, Hypertension, Peripheral Venous Disease Endocrine Patient has history of Type II Diabetes Genitourinary Denies history of End Stage Renal Disease Neurologic Patient has history of Dementia - Alzheimers, Neuropathy Medical A Surgical History Notes nd Eyes diabetic retinopathy Cardiovascular atherosclerotic heart disease A fib hyperlipedemia Gastrointestinal GERD Genitourinary CKD stage II Neurologic persistent mood disorder, affective vascular dementia Objective Constitutional Slightly hypotensive, asymptomatic.. Tachycardic, irregular rhythm.. No acute distress. Vitals  Time Taken: 12:52 PM, Height: 67 in, Weight: 183 lbs, BMI: 28.7, Temperature: 97.6 F, Pulse: 150 bpm, Respiratory Rate: 20 breaths/min, Blood Pressure: 93/63 mmHg. Respiratory Normal work of breathing on room air. General Notes: 09/02/2021: There is a little bit of slough accumulation on the sacral pressure ulcer. There is scabbing/eschar on her right great toe, right knee, and right lateral ankle. She also has a blood blister on her left first metatarsal head that looks like there may be open tissue underneath this. Integumentary (Hair, Skin) Wound #10 status is Open. Original cause of wound was Gradually Appeared. The date acquired was: 08/18/2021. The wound has been in treatment 2 weeks. The wound is located on the Right,Medial T Great. The wound measures 0.5cm length x 0.3cm width x 0.1cm depth; 0.118cm^2 area and 0.012cm^3 volume. oe There is Fat Layer (Subcutaneous Tissue) exposed. There is no tunneling or undermining noted. There is a medium amount of serosanguineous drainage noted. The wound margin is distinct with the outline attached to the wound base. There is small (1-33%) red granulation within the wound bed. There is a large (67-100%) amount of necrotic tissue within the wound bed including Eschar. Wound #12 status is Open. Original cause of wound was Pressure Injury. The date acquired was: 08/15/2021. The wound has been in treatment 1 weeks. The wound is located on the Sacrum. The wound measures 1.2cm length x 0.8cm width x 0.1cm depth; 0.754cm^2 area and 0.075cm^3 volume. There is Fat Layer (Subcutaneous Tissue) exposed. There is no tunneling or undermining noted. There is a medium amount of serosanguineous drainage noted. The wound margin is distinct with the outline attached to the wound base. There is small (1-33%) red granulation within the wound bed. There is a large (67-100%) amount of necrotic tissue within the wound bed including Adherent Slough. Wound #13 status is Open.  Original cause of wound was Trauma. The date acquired was: 09/02/2021. The wound is located on the Right,Lateral Foot. The wound measures 0.7cm length x 0.5cm width x 0.1cm depth; 0.275cm^2 area and 0.027cm^3 volume. There  is Fat Layer (Subcutaneous Tissue) exposed. There is no tunneling or undermining noted. There is a medium amount of serosanguineous drainage noted. The wound margin is distinct with the outline attached to the wound base. There is large (67-100%) red granulation within the wound bed. There is no necrotic tissue within the wound bed. Wound #14 status is Open. Original cause of wound was Trauma. The date acquired was: 09/02/2021. The wound is located on the Left Metatarsal head first. The wound measures 0.5cm length x 0.6cm width x 0.1cm depth; 0.236cm^2 area and 0.024cm^3 volume. There is Fat Layer (Subcutaneous Tissue) exposed. There is no tunneling or undermining noted. There is a medium amount of serosanguineous drainage noted. The wound margin is distinct with the outline attached to the wound base. There is large (67-100%) red granulation within the wound bed. There is a small (1-33%) amount of necrotic tissue within the wound bed including Eschar. Wound #3 status is Healed - Epithelialized. Original cause of wound was Gradually Appeared. The date acquired was: 05/05/2021. The wound has been in treatment 12 weeks. The wound is located on the Left,Anterior Lower Leg. The wound measures 0cm length x 0cm width x 0cm depth; 0cm^2 area and 0cm^3 volume. There is no tunneling or undermining noted. There is a none present amount of drainage noted. The wound margin is flat and intact. There is no granulation within the wound bed. There is no necrotic tissue within the wound bed. Wound #9 status is Open. Original cause of wound was Shear/Friction. The date acquired was: 08/18/2021. The wound has been in treatment 2 weeks. The wound is located on the Right,Lateral Knee. The wound measures 1cm  length x 0.5cm width x 0.1cm depth; 0.393cm^2 area and 0.039cm^3 volume. There is Fat Layer (Subcutaneous Tissue) exposed. There is no tunneling or undermining noted. There is a none present amount of drainage noted. The wound margin is distinct with the outline attached to the wound base. There is small (1-33%) red granulation within the wound bed. There is a large (67-100%) amount of necrotic tissue within the wound bed including Eschar. Assessment Active Problems ICD-10 Type 2 diabetes mellitus with foot ulcer Type 2 diabetes mellitus with other skin ulcer Pressure ulcer of sacral region, stage 2 Non-pressure chronic ulcer of other part of left foot with bone involvement without evidence of necrosis Peripheral vascular disease, unspecified Chronic venous hypertension (idiopathic) with ulcer of right lower extremity Chronic venous hypertension (idiopathic) with ulcer of left lower extremity Essential (primary) hypertension Chronic diastolic (congestive) heart failure Chronic atrial fibrillation, unspecified Cerebral infarction, unspecified Procedures Wound #10 Pre-procedure diagnosis of Wound #10 is a Diabetic Wound/Ulcer of the Lower Extremity located on the Right,Medial T Great .Severity of Tissue Pre oe Debridement is: Fat layer exposed. There was a Selective/Open Wound Skin/Epidermis Debridement with a total area of 0.15 sq cm performed by Fredirick Maudlin, MD. With the following instrument(s): Curette to remove Non-Viable tissue/material. Material removed includes Eschar and Skin: Epidermis and after achieving pain control using Other (benzocaine 20%). No specimens were taken. A time out was conducted at 13:14, prior to the start of the procedure. A Minimum amount of bleeding was controlled with Pressure. The procedure was tolerated well with a pain level of 0 throughout and a pain level of 0 following the procedure. Post Debridement Measurements: 0.5cm length x 0.3cm width x 0.1cm  depth; 0.012cm^3 volume. Character of Wound/Ulcer Post Debridement is improved. Severity of Tissue Post Debridement is: Fat layer exposed. Post procedure Diagnosis Wound #10:  Same as Pre-Procedure Wound #12 Pre-procedure diagnosis of Wound #12 is a Pressure Ulcer located on the Sacrum . There was a Excisional Skin/Subcutaneous Tissue Debridement with a total area of 0.96 sq cm performed by Fredirick Maudlin, MD. With the following instrument(s): Curette to remove Viable and Non-Viable tissue/material. Material removed includes Subcutaneous Tissue and Slough and after achieving pain control using Other (benzocaine 20%). No specimens were taken. A time out was conducted at 13:14, prior to the start of the procedure. A Minimum amount of bleeding was controlled with Pressure. The procedure was tolerated well with a pain level of 0 throughout and a pain level of 0 following the procedure. Post Debridement Measurements: 1.2cm length x 0.8cm width x 0.1cm depth; 0.075cm^3 volume. Post debridement Stage noted as Category/Stage II. Character of Wound/Ulcer Post Debridement is improved. Post procedure Diagnosis Wound #12: Same as Pre-Procedure Wound #13 Pre-procedure diagnosis of Wound #13 is an Abrasion located on the Right,Lateral Foot . There was a Selective/Open Wound Skin/Epidermis Debridement with a total area of 0.35 sq cm performed by Fredirick Maudlin, MD. With the following instrument(s): Curette to remove Non-Viable tissue/material. Material removed includes Eschar and Skin: Epidermis and after achieving pain control using Other (benzocaine 20%). No specimens were taken. A time out was conducted at 13:14, prior to the start of the procedure. A Minimum amount of bleeding was controlled with Pressure. The procedure was tolerated well with a pain level of 0 throughout and a pain level of 0 following the procedure. Post Debridement Measurements: 0.7cm length x 0.5cm width x 0.1cm depth; 0.027cm^3  volume. Character of Wound/Ulcer Post Debridement is improved. Post procedure Diagnosis Wound #13: Same as Pre-Procedure Wound #14 Pre-procedure diagnosis of Wound #14 is an Abrasion located on the Left Metatarsal head first . There was a Selective/Open Wound Skin/Epidermis Debridement with a total area of 0.3 sq cm performed by Fredirick Maudlin, MD. With the following instrument(s): Curette to remove Non-Viable tissue/material. Material removed includes Eschar and Skin: Epidermis and after achieving pain control using Other (benzocaine 20%). No specimens were taken. A time out was conducted at 13:14, prior to the start of the procedure. A Minimum amount of bleeding was controlled with Pressure. The procedure was tolerated well with a pain level of 0 throughout and a pain level of 0 following the procedure. Post Debridement Measurements: 0.5cm length x 0.6cm width x 0.1cm depth; 0.024cm^3 volume. Character of Wound/Ulcer Post Debridement is improved. Post procedure Diagnosis Wound #14: Same as Pre-Procedure Wound #9 Pre-procedure diagnosis of Wound #9 is an Abrasion located on the Right,Lateral Knee . There was a Selective/Open Wound Skin/Epidermis Debridement with a total area of 0.5 sq cm performed by Fredirick Maudlin, MD. With the following instrument(s): Curette to remove Non-Viable tissue/material. Material removed includes Eschar and Skin: Epidermis and after achieving pain control using Other (benzocaine 20%). No specimens were taken. A time out was conducted at 13:14, prior to the start of the procedure. A Minimum amount of bleeding was controlled with Pressure. The procedure was tolerated well with a pain level of 0 throughout and a pain level of 0 following the procedure. Post Debridement Measurements: 1cm length x 0.5cm width x 0.1cm depth; 0.039cm^3 volume. Character of Wound/Ulcer Post Debridement is improved. Post procedure Diagnosis Wound #9: Same as Pre-Procedure Plan Follow-up  Appointments: Return Appointment in 1 week. - Dr. Celine Ahr room 2 Bathing/ Shower/ Hygiene: May shower with protection but do not get wound dressing(s) wet. - may purchase cast protector from [facility], [facility], [employer]  Edema Control - Lymphedema / SCD / Other: Elevate legs to the level of the heart or above for 30 minutes daily and/or when sitting, a frequency of: - throughout the day Moisturize legs daily. Off-Loading: Other: - cushion heels with pillow or heel cups, offload and stay off bottom Additional Orders / Instructions: Wound #10 Right,Medial T Great: oe Other: - *****PLEASE SEND TO HER HEART DOCTOR FOR HER ELEVATED HEART RATE*********** Wound #12 Sacrum: Other: - *****PLEASE SEND TO HER HEART DOCTOR FOR HER ELEVATED HEART RATE*********** Wound #13 Right,Lateral Foot: Other: - *****PLEASE SEND TO HER HEART DOCTOR FOR HER ELEVATED HEART RATE*********** Wound #14 Left Metatarsal head first: Other: - *****PLEASE SEND TO HER HEART DOCTOR FOR HER ELEVATED HEART RATE*********** Wound #9 Right,Lateral Knee: Other: - *****PLEASE SEND TO HER HEART DOCTOR FOR HER ELEVATED HEART RATE*********** WOUND #10: - T Great Wound Laterality: Right, Medial oe Cleanser: Soap and Water Every Other Day/7 Days Discharge Instructions: May shower and wash wound with dial antibacterial soap and water prior to dressing change. Cleanser: Wound Cleanser Every Other Day/7 Days Discharge Instructions: Cleanse the wound with wound cleanser prior to applying a clean dressing using gauze sponges, not tissue or cotton balls. Peri-Wound Care: Sween Lotion (Moisturizing lotion) Every Other Day/7 Days Discharge Instructions: Apply moisturizing lotion as directed Prim Dressing: KerraCel Ag Gelling Fiber Dressing, 2x2 in (silver alginate) Every Other Day/7 Days ary Discharge Instructions: Apply silver alginate to wound bed as instructed Secondary Dressing: Woven Gauze Sponge, Non-Sterile 4x4 in Every Other Day/7  Days Discharge Instructions: Apply over primary dressing as directed. Secured With: Child psychotherapist, Sterile 2x75 (in/in) Every Other Day/7 Days Discharge Instructions: Secure with stretch gauze as directed. Secured With: 59M Medipore H Soft Cloth Surgical T ape, 4 x 10 (in/yd) Every Other Day/7 Days Discharge Instructions: Secure with tape as directed. WOUND #12: - Sacrum Wound Laterality: Cleanser: Soap and Water 1 x Per Day/30 Days Discharge Instructions: May shower and wash wound with dial antibacterial soap and water prior to dressing change. Cleanser: Wound Cleanser 1 x Per Day/30 Days Discharge Instructions: Cleanse the wound with wound cleanser prior to applying a clean dressing using gauze sponges, not tissue or cotton balls. Peri-Wound Care: Zinc Oxide Ointment 30g tube 1 x Per Day/30 Days Discharge Instructions: Apply Zinc Oxide to periwound with each dressing change Prim Dressing: Iodosorb Gel 10 (gm) Tube 1 x Per Day/30 Days ary Discharge Instructions: Apply to wound bed as instructed Secondary Dressing: Bordered Gauze, 4x4 in 1 x Per Day/30 Days Discharge Instructions: Apply over primary dressing as directed. Secondary Dressing: Woven Gauze Sponge, Non-Sterile 4x4 in 1 x Per Day/30 Days Discharge Instructions: Apply over primary dressing as directed. WOUND #13: - Foot Wound Laterality: Right, Lateral Cleanser: Soap and Water Every Other Day/7 Days Discharge Instructions: May shower and wash wound with dial antibacterial soap and water prior to dressing change. Cleanser: Wound Cleanser Every Other Day/7 Days Discharge Instructions: Cleanse the wound with wound cleanser prior to applying a clean dressing using gauze sponges, not tissue or cotton balls. Peri-Wound Care: Sween Lotion (Moisturizing lotion) Every Other Day/7 Days Discharge Instructions: Apply moisturizing lotion as directed Prim Dressing: KerraCel Ag Gelling Fiber Dressing, 2x2 in (silver alginate)  Every Other Day/7 Days ary Discharge Instructions: Apply silver alginate to wound bed as instructed Secondary Dressing: Woven Gauze Sponge, Non-Sterile 4x4 in Every Other Day/7 Days Discharge Instructions: Apply over primary dressing as directed. Com pression Wrap: Kerlix Roll 4.5x3.1 (in/yd) Every Other Day/7 Days Discharge  Instructions: Apply Kerlix and Coban compression as directed. Com pression Wrap: Coban Self-Adherent Wrap 4x5 (in/yd) Every Other Day/7 Days Discharge Instructions: Apply over Kerlix as directed. WOUND #14: - Metatarsal head first Wound Laterality: Left Cleanser: Soap and Water Every Other Day/7 Days Discharge Instructions: May shower and wash wound with dial antibacterial soap and water prior to dressing change. Cleanser: Wound Cleanser Every Other Day/7 Days Discharge Instructions: Cleanse the wound with wound cleanser prior to applying a clean dressing using gauze sponges, not tissue or cotton balls. Peri-Wound Care: Sween Lotion (Moisturizing lotion) Every Other Day/7 Days Discharge Instructions: Apply moisturizing lotion as directed Prim Dressing: KerraCel Ag Gelling Fiber Dressing, 2x2 in (silver alginate) Every Other Day/7 Days ary Discharge Instructions: Apply silver alginate to wound bed as instructed Secondary Dressing: Bordered Gauze, 2x2 in Every Other Day/7 Days Discharge Instructions: Apply over primary dressing as directed. WOUND #9: - Knee Wound Laterality: Right, Lateral Cleanser: Soap and Water Every Other Day/30 Days Discharge Instructions: May shower and wash wound with dial antibacterial soap and water prior to dressing change. Prim Dressing: KerraCel Ag Gelling Fiber Dressing, 2x2 in (silver alginate) Every Other Day/30 Days ary Discharge Instructions: Apply silver alginate to wound bed as instructed Secondary Dressing: Woven Gauze Sponge, Non-Sterile 4x4 in Every Other Day/30 Days Discharge Instructions: Apply over primary dressing as  directed. Secured With: Coban Self-Adherent Wrap 4x5 (in/yd) Every Other Day/30 Days Discharge Instructions: Secure with Coban as directed. Secured With: [employer], 4.5x3.1 (in/yd) Every Other Day/30 Days Discharge Instructions: Secure with Kerlix as directed. 09/02/2021: There is a little bit of slough accumulation on the sacral pressure ulcer. There is scabbing/eschar on her right great toe, right knee, and right lateral ankle. She also has a blood blister on her left first metatarsal head that looks like there may be open tissue underneath this. I used a curette to debride the eschar off of her leg wounds. I unroofed the blood blister and did identify a small open wound on her left first metatarsal head. I also debrided the slough off of her sacral wound. We will continue using silver alginate to the wounds on her legs and feet. We will wrap the right leg with Kerlix and Coban. We will apply a foam border dressing to the left first metatarsal head. Continue Iodosorb to the sacrum. Reiterated the need for offloading of the sacrum. She needs to contact her cardiologist regarding her RVR today. I know they have been working to balance rate control with hypotension but I will defer this to them. Follow-up in 1 week. Electronic Signature(s) Signed: 09/02/2021 1:47:09 PM By: Fredirick Maudlin MD FACS Entered By: Fredirick Maudlin on 09/02/2021 13:47:09 -------------------------------------------------------------------------------- HxROS Details Patient Name: Date of Service: Gerlean Ren, [place] TSY L. 09/02/2021 12:45 PM Medical Record Number: [id number] Patient Account Number: [id number] Date of Birth/Sex: Treating RN: 05/09/1945 (76 y.o. Harlow Ohms Primary Care Provider: Bing Matter Other Clinician: Referring Provider: Treating Provider/Extender: Donia Ast in Treatment: 12 Unable to Obtain Patient History due to Dementia Information Obtained  From Chart Eyes Medical History: Past Medical History Notes: diabetic retinopathy Ear/Nose/Mouth/Throat Medical History: Positive for: Chronic sinus problems/congestion Hematologic/Lymphatic Medical History: Positive for: Anemia - due to CKD Respiratory Medical History: Positive for: Chronic Obstructive Pulmonary Disease (COPD) Cardiovascular Medical History: Positive for: Congestive Heart Failure; Hypertension; Peripheral Venous Disease Past Medical History Notes: atherosclerotic heart disease A fib hyperlipedemia Gastrointestinal Medical History: Past Medical History Notes: GERD Endocrine Medical History: Positive for: Type II Diabetes  Treated with: Insulin, Oral agents Blood sugar tested every day: Yes Tested : Genitourinary Medical History: Negative for: End Stage Renal Disease Past Medical History Notes: CKD stage II Neurologic Medical History: Positive for: Dementia - Alzheimers; Neuropathy Past Medical History Notes: persistent mood disorder, affective vascular dementia HBO Extended History Items Ear/Nose/Mouth/Throat: Chronic sinus problems/congestion Immunizations Pneumococcal Vaccine: Received Pneumococcal Vaccination: Yes Received Pneumococcal Vaccination On or After 60th Birthday: Yes Implantable Devices None Family and Social History Never smoker; Marital Status - Divorced; Alcohol Use: Never; Drug Use: No History; Caffeine Use: Daily; Financial Concerns: No; Food, Clothing or Shelter Needs: No; Support System Lacking: No; Transportation Concerns: No Electronic Signature(s) Signed: 09/02/2021 4:49:27 PM By: Fredirick Maudlin MD FACS Signed: 09/02/2021 5:16:25 PM By: Adline Peals Entered By: Fredirick Maudlin on 09/02/2021 13:44:02 -------------------------------------------------------------------------------- SuperBill Details Patient Name: Date of Service: [name] 09/02/2021 Medical Record Number: [id number] Patient Account  Number: [id number] Date of Birth/Sex: Treating RN: [birth year] (75 y.o. Harlow Ohms Primary Care Provider: Bing Matter Other Clinician: Referring Provider: Treating Provider/Extender: Donia Ast in Treatment: 12 Diagnosis Coding ICD-10 Codes Code Description E11.621 Type 2 diabetes mellitus with foot ulcer E11.622 Type 2 diabetes mellitus with other skin ulcer L89.152 Pressure ulcer of sacral region, stage 2 L97.526 Non-pressure chronic ulcer of other part of left foot with bone involvement without evidence of necrosis I73.9 Peripheral vascular disease, unspecified I87.311 Chronic venous hypertension (idiopathic) with ulcer of right lower extremity I87.312 Chronic venous hypertension (idiopathic) with ulcer of left lower extremity I10 Essential (primary) hypertension Q82.50 Chronic diastolic (congestive) heart failure I48.20 Chronic atrial fibrillation, unspecified I63.9 Cerebral infarction, unspecified Facility Procedures CPT4 Code: [id number] Description: 91694 - DEB SUBQ TISSUE 20 SQ CM/< ICD-10 Diagnosis Description L89.152 Pressure ulcer of sacral region, stage 2 Modifier: Quantity: 1 Physician Procedures : CPT4 Code Description Modifier [id number] - WC PHYS LEVEL 3 - EST PT 25 ICD-10 Diagnosis Description L89.152 Pressure ulcer of sacral region, stage 2 L97.526 Non-pressure chronic ulcer of other part of left foot with bone involvement without  evidence of necro E11.622 Type 2 diabetes mellitus with other skin ulcer I48.20 Chronic atrial fibrillation, unspecified Quantity: 1 sis : [id number] - WC PHYS SUBQ TISS 20 SQ CM ICD-10 Diagnosis Description L89.152 Pressure ulcer of sacral region, stage 2 Quantity: 1 : [id number] - WC PHYS DEBR WO ANESTH 20 SQ CM ICD-10 Diagnosis Description E11.621 Type 2 diabetes mellitus with foot ulcer I87.311 Chronic venous hypertension (idiopathic) with ulcer of right lower extremity L97.526  Non-pressure chronic ulcer of  other part of left foot with bone involvement without evidence of necro E11.622 Type 2 diabetes mellitus with other skin ulcer Quantity: 1 sis Electronic Signature(s) Signed: 09/02/2021 1:48:27 PM By: Fredirick Maudlin MD FACS Entered By: Fredirick Maudlin on 09/02/2021 13:48:26

## 2021-09-02 NOTE — Progress Notes (Signed)
NIKKOL, PAI ([id number]) Visit Report for 09/02/2021 Arrival Information Details Patient Name: Date of Service: [name], [name] 09/02/2021 12:45 PM Medical Record Number: [id number] Patient Account Number: [id number] Date of Birth/Sex: Treating RN: February 25, 1946 (76 y.o. Harlow Ohms Primary Care [name]: Bing Matter Other Clinician: Referring Queena Monrreal: Treating Om Lizotte/Extender: Donia Ast in Treatment: 12 Visit Information History Since Last Visit Added or deleted any medications: No Patient Arrived: Wheel Chair Any new allergies or adverse reactions: No Arrival Time: 12:47 Had a fall or experienced change in No Accompanied By: caregiver activities of daily living that may affect Transfer Assistance: Manual risk of falls: Patient Identification Verified: Yes Signs or symptoms of abuse/neglect since last visito No Secondary Verification Process Completed: Yes Hospitalized since last visit: No Patient Has Alerts: Yes Implantable device outside of the clinic excluding No Patient Alerts: Patient on Blood Thinner cellular tissue based products placed in the center R ABI non compressible since last visit: L ABI non compressible Has Dressing in Place as Prescribed: Yes Pain Present Now: Yes Electronic Signature(s) Signed: 09/02/2021 5:16:25 PM By: Adline Peals Entered By: Adline Peals on 09/02/2021 12:52:39 -------------------------------------------------------------------------------- Encounter Discharge Information Details Patient Name: Date of Service: Christina Asa. 09/02/2021 12:45 PM Medical Record Number: [id number] Patient Account Number: [id number] Date of Birth/Sex: Treating RN: 1946-02-24 (76 y.o. Harlow Ohms Primary Care Praneel Haisley: Bing Matter Other Clinician: Referring Wilhelm Ganaway: Treating Felice Hope/Extender: Donia Ast in Treatment: 12 Encounter Discharge Information  Items Post Procedure Vitals Discharge Condition: Stable Temperature (F): 97.6 Ambulatory Status: Wheelchair Pulse (bpm): 150 Discharge Destination: [place] Respiratory Rate (breaths/min): 20 Orders Sent: Yes Blood Pressure (mmHg): 93/63 Transportation: Private Auto Accompanied By: caregiver Schedule Follow-up Appointment: Yes Clinical Summary of Care: Patient Declined Electronic Signature(s) Signed: 09/02/2021 5:16:25 PM By: Adline Peals Entered By: Adline Peals on 09/02/2021 13:55:20 -------------------------------------------------------------------------------- Lower Extremity Assessment Details Patient Name: Date of Service: [name], [name] 09/02/2021 12:45 PM Medical Record Number: [id number] Patient Account Number: [id number] Date of Birth/Sex: Treating RN: [birth year] (75 y.o. Harlow Ohms Primary Care Amybeth Sieg: Bing Matter Other Clinician: Referring Dmani Mizer: Treating Casimira Sutphin/Extender: Willodean Rosenthal Weeks in Treatment: 12 Edema Assessment Assessed: Shirlyn Goltz: No] [Right: No] E[Left: dema] [Right: :] Calf Left: Right: Point of Measurement: 28 cm From Medial Instep 25.1 cm 24.5 cm Ankle Left: Right: Point of Measurement: 11 cm From Medial Instep 18.4 cm 17.6 cm Vascular Assessment Pulses: Dorsalis Pedis Palpable: [Left:Yes] [Right:Yes] Electronic Signature(s) Signed: 09/02/2021 5:16:25 PM By: Adline Peals Entered By: Adline Peals on 09/02/2021 12:57:40 -------------------------------------------------------------------------------- Multi Wound Chart Details Patient Name: Date of Service: Christina Asa. 09/02/2021 12:45 PM Medical Record Number: [id number] Patient Account Number: [id number] Date of Birth/Sex: Treating RN: [birth year] (76 y.o. Harlow Ohms Primary Care Izaya Netherton: Bing Matter Other Clinician: Referring Stone Spirito: Treating Cainen Burnham/Extender: Donia Ast in Treatment: 12 Vital Signs Height(in): 24 Pulse(bpm): 150 Weight(lbs): 183 Blood Pressure(mmHg): 93/63 Body Mass Index(BMI): 28.7 Temperature(F): 97.6 Respiratory Rate(breaths/min): 20 Photos: [13:No Photos] Right, Medial T Great oe Sacrum Right, Lateral Foot Wound Location: Gradually Appeared Pressure Injury Trauma Wounding Event: Diabetic Wound/Ulcer of the Lower Pressure Ulcer Abrasion Primary Etiology: Extremity Chronic sinus problems/congestion, Chronic sinus problems/congestion,Chronic sinus problems/congestion, Comorbid History: Anemia, Chronic Obstructive Anemia, Chronic Obstructive Anemia, Chronic Obstructive Pulmonary Disease (COPD), Pulmonary Disease (COPD), Pulmonary Disease (COPD), Congestive Heart Failure, Congestive Heart Failure, Congestive Heart Failure, Hypertension, Peripheral Venous Hypertension, Peripheral Venous Hypertension, Peripheral Venous Disease, Type II Diabetes, Dementia,  Disease, Type II Diabetes, Dementia, Disease, Type II Diabetes, Dementia, Neuropathy Neuropathy Neuropathy 08/18/2021 08/15/2021 09/02/2021 Date Acquired: 2 1 0 Weeks of Treatment: Open Open Open Wound Status: No No No Wound Recurrence: No No No Clustered Wound: 0.5x0.3x0.1 1.2x0.8x0.1 0.7x0.5x0.1 Measurements L x W x D (cm) 0.118 0.754 0.275 A (cm) : rea 0.012 0.075 0.027 Volume (cm) : -151.10% 43.20% N/A % Reduction in A rea: -140.00% 43.60% N/A % Reduction in Volume: Grade 2 Category/Stage II Full Thickness Without Exposed Classification: Support Structures Medium Medium Medium Exudate A mount: Serosanguineous Serosanguineous Serosanguineous Exudate Type: red, brown red, brown red, brown Exudate Color: Distinct, outline attached Distinct, outline attached Distinct, outline attached Wound Margin: Small (1-33%) Small (1-33%) Large (67-100%) Granulation A mount: Red Red Red Granulation Quality: Large (67-100%) Large (67-100%) None Present  (0%) Necrotic A mount: Eschar Adherent Slough N/A Necrotic Tissue: Fat Layer (Subcutaneous Tissue): Yes Fat Layer (Subcutaneous Tissue): Yes Fat Layer (Subcutaneous Tissue): Yes Exposed Structures: Fascia: No Fascia: No Fascia: No Tendon: No Tendon: No Tendon: No Muscle: No Muscle: No Muscle: No Joint: No Joint: No Joint: No Bone: No Bone: No Bone: No Large (67-100%) Small (1-33%) Medium (34-66%) Epithelialization: Debridement - Selective/Open Wound Debridement - Excisional Debridement - Selective/Open Wound Debridement: Pre-procedure Verification/Time Out 13:14 13:14 13:14 Taken: Other Other Other Pain Control: Necrotic/Eschar Subcutaneous, Slough Necrotic/Eschar Tissue Debrided: Skin/Epidermis Skin/Subcutaneous Tissue Skin/Epidermis Level: 0.15 0.96 0.35 Debridement A (sq cm): rea Curette Curette Curette Instrument: Minimum Minimum Minimum Bleeding: Pressure Pressure Pressure Hemostasis A chieved: 0 0 0 Procedural Pain: 0 0 0 Post Procedural Pain: Procedure was tolerated well Procedure was tolerated well Procedure was tolerated well Debridement Treatment Response: 0.5x0.3x0.1 1.2x0.8x0.1 0.7x0.5x0.1 Post Debridement Measurements L x W x D (cm) 0.012 0.075 0.027 Post Debridement Volume: (cm) N/A Category/Stage II N/A Post Debridement Stage: Debridement Debridement Debridement Procedures Performed: Wound Number: '[id number] 3 9 '$ Photos: No Photos Left Metatarsal head first Left, Anterior Lower Leg Right, Lateral Knee Wound Location: Trauma Gradually Appeared Shear/Friction Wounding Event: Abrasion Diabetic Wound/Ulcer of the Lower Abrasion Primary Etiology: Extremity Chronic sinus problems/congestion, Chronic sinus problems/congestion, Chronic sinus problems/congestion, Comorbid History: Anemia, Chronic Obstructive Anemia, Chronic Obstructive Anemia, Chronic Obstructive Pulmonary Disease (COPD), Pulmonary Disease (COPD), Pulmonary Disease  (COPD), Congestive Heart Failure, Congestive Heart Failure, Congestive Heart Failure, Hypertension, Peripheral Venous Hypertension, Peripheral Venous Hypertension, Peripheral Venous Disease, Type II Diabetes, Dementia, Disease, Type II Diabetes, Dementia, Disease, Type II Diabetes, Dementia, Neuropathy Neuropathy Neuropathy 09/02/2021 05/05/2021 08/18/2021 Date Acquired: 0 12 2 Weeks of Treatment: Open Healed - Epithelialized Open Wound Status: No No No Wound Recurrence: No Yes No Clustered Wound: 0.5x0.6x0.1 0x0x0 1x0.5x0.1 Measurements L x W x D (cm) 0.236 0 0.393 A (cm) : rea 0.024 0 0.039 Volume (cm) : N/A 100.00% 50.90% % Reduction in Area: N/A 100.00% 51.30% % Reduction in Volume: Full Thickness Without Exposed Grade 2 Full Thickness Without Exposed Classification: Support Structures Support Structures Medium None Present None Present Exudate A mount: Serosanguineous N/A N/A Exudate Type: red, brown N/A N/A Exudate Color: Distinct, outline attached Flat and Intact Distinct, outline attached Wound Margin: Large (67-100%) None Present (0%) Small (1-33%) Granulation A mount: Red N/A Red Granulation Quality: Small (1-33%) None Present (0%) Large (67-100%) Necrotic A mount: Eschar N/A Eschar Necrotic Tissue: Fat Layer (Subcutaneous Tissue): Yes Fascia: No Fat Layer (Subcutaneous Tissue): Yes Exposed Structures: Fascia: No Fat Layer (Subcutaneous Tissue): No Fascia: No Tendon: No Tendon: No Tendon: No Muscle: No Muscle: No Muscle: No Joint: No Joint: No Joint:  No Bone: No Bone: No Bone: No Small (1-33%) Large (67-100%) Large (67-100%) Epithelialization: Debridement - Selective/Open Wound N/A Debridement - Selective/Open Wound Debridement: Pre-procedure Verification/Time Out 13:14 N/A 13:14 Taken: Other N/A Other Pain Control: Necrotic/Eschar N/A Necrotic/Eschar Tissue Debrided: Skin/Epidermis N/A Skin/Epidermis Level: 0.3 N/A 0.5 Debridement A  (sq cm): rea Curette N/A Curette Instrument: Minimum N/A Minimum Bleeding: Pressure N/A Pressure Hemostasis A chieved: 0 N/A 0 Procedural Pain: 0 N/A 0 Post Procedural Pain: Procedure was tolerated well N/A Procedure was tolerated well Debridement Treatment Response: 0.5x0.6x0.1 N/A 1x0.5x0.1 Post Debridement Measurements L x W x D (cm) 0.024 N/A 0.039 Post Debridement Volume: (cm) N/A N/A N/A Post Debridement Stage: Debridement N/A Debridement Procedures Performed: Treatment Notes Electronic Signature(s) Signed: 09/02/2021 1:42:41 PM By: Fredirick Maudlin MD FACS Signed: 09/02/2021 5:16:25 PM By: Adline Peals Entered By: Fredirick Maudlin on 09/02/2021 13:42:41 -------------------------------------------------------------------------------- Multi-Disciplinary Care Plan Details Patient Name: Date of Service: Christina Asa. 09/02/2021 12:45 PM Medical Record Number: [id number] Patient Account Number: [id number] Date of Birth/Sex: Treating RN: March 19, 1946 (76 y.o. Harlow Ohms Primary Care Bradyn Soward: Bing Matter Other Clinician: Referring Rylan Bernard: Treating Maedell Hedger/Extender: Donia Ast in Treatment: 12 Multidisciplinary Care Plan reviewed with physician Active Inactive Abuse / Safety / Falls / Self Care Management Nursing Diagnoses: Potential for falls Potential for injury related to falls Goals: Patient will not experience any injury related to falls Date Initiated: 06/09/2021 Target Resolution Date: 09/15/2021 Goal Status: Active Patient/caregiver will verbalize/demonstrate measures taken to prevent injury and/or falls Date Initiated: 06/09/2021 Target Resolution Date: 09/15/2021 Goal Status: Active Interventions: Assess Activities of Daily Living upon admission and as needed Assess fall risk on admission and as needed Assess: immobility, friction, shearing, incontinence upon admission and as needed Assess impairment of  mobility on admission and as needed per policy Assess personal safety and home safety (as indicated) on admission and as needed Assess self care needs on admission and as needed Provide education on fall prevention Provide education on personal and home safety Notes: Nutrition Nursing Diagnoses: Potential for alteratiion in Nutrition/Potential for imbalanced nutrition Goals: Patient/caregiver agrees to and verbalizes understanding of need to use nutritional supplements and/or vitamins as prescribed Date Initiated: 06/09/2021 Target Resolution Date: 09/15/2021 Goal Status: Active Patient/caregiver will maintain therapeutic glucose control Date Initiated: 06/09/2021 Target Resolution Date: 09/15/2021 Goal Status: Active Interventions: Assess HgA1c results as ordered upon admission and as needed Assess patient nutrition upon admission and as needed per policy Provide education on elevated blood sugars and impact on wound healing Provide education on nutrition Treatment Activities: Education provided on Nutrition : 06/09/2021 Notes: Wound/Skin Impairment Nursing Diagnoses: Impaired tissue integrity Knowledge deficit related to ulceration/compromised skin integrity Goals: Patient/caregiver will verbalize understanding of skin care regimen Date Initiated: 06/09/2021 Target Resolution Date: 09/15/2021 Goal Status: Active Interventions: Assess patient/caregiver ability to obtain necessary supplies Assess patient/caregiver ability to perform ulcer/skin care regimen upon admission and as needed Assess ulceration(s) every visit Provide education on ulcer and skin care Notes: Electronic Signature(s) Signed: 09/02/2021 5:16:25 PM By: Adline Peals Entered By: Adline Peals on 09/02/2021 13:06:26 -------------------------------------------------------------------------------- Pain Assessment Details Patient Name: Date of Service: Christina Asa 09/02/2021 12:45 PM Medical Record  Number: [id number] Patient Account Number: [id number] Date of Birth/Sex: Treating RN: October 11, 1945 (76 y.o. Harlow Ohms Primary Care Findlay Dagher: Bing Matter Other Clinician: Referring Seryna Marek: Treating Minka Knight/Extender: Donia Ast in Treatment: 12 Active Problems Location of Pain Severity and Description of Pain Patient Has Paino Yes Site Locations Pain  Location: Pain in Ulcers Duration of the Pain. Constant / Intermittento Constant Rate the pain. Current Pain Level: 10 Character of Pain Describe the Pain: Aching Pain Management and Medication Current Pain Management: Electronic Signature(s) Signed: 09/02/2021 5:16:25 PM By: Adline Peals Entered By: Adline Peals on 09/02/2021 12:54:05 -------------------------------------------------------------------------------- Patient/Caregiver Education Details Patient Name: Date of Service: Christina Asa 6/1/2023andnbsp12:45 PM Medical Record Number: [id number] Patient Account Number: [id number] Date of Birth/Gender: Treating RN: 03-Jun-1945 (76 y.o. Harlow Ohms Primary Care Physician: Bing Matter Other Clinician: Referring Physician: Treating Physician/Extender: Donia Ast in Treatment: 12 Education Assessment Education Provided To: Patient Education Topics Provided Wound/Skin Impairment: Methods: Explain/Verbal Responses: Reinforcements needed, State content correctly Electronic Signature(s) Signed: 09/02/2021 5:16:25 PM By: Adline Peals Entered By: Adline Peals on 09/02/2021 13:06:38 -------------------------------------------------------------------------------- Wound Assessment Details Patient Name: Date of Service: Christina Asa. 09/02/2021 12:45 PM Medical Record Number: [id number] Patient Account Number: [id number] Date of Birth/Sex: Treating RN: 11-12-1945 (75 y.o. Harlow Ohms Primary Care Dea Bitting:  Bing Matter Other Clinician: Referring Exilda Wilhite: Treating Berman Grainger/Extender: Donia Ast in Treatment: 12 Wound Status Wound Number: 10 Primary Diabetic Wound/Ulcer of the Lower Extremity Etiology: Wound Location: Right, Medial T Great oe Wound Open Wounding Event: Gradually Appeared Status: Date Acquired: 08/18/2021 Comorbid Chronic sinus problems/congestion, Anemia, Chronic Obstructive Weeks Of Treatment: 2 History: Pulmonary Disease (COPD), Congestive Heart Failure, Clustered Wound: No Hypertension, Peripheral Venous Disease, Type II Diabetes, Dementia, Neuropathy Photos Wound Measurements Length: (cm) 0.5 Width: (cm) 0.3 Depth: (cm) 0.1 Area: (cm) 0.118 Volume: (cm) 0.012 % Reduction in Area: -151.1% % Reduction in Volume: -140% Epithelialization: Large (67-100%) Tunneling: No Undermining: No Wound Description Classification: Grade 2 Wound Margin: Distinct, outline attached Exudate Amount: Medium Exudate Type: Serosanguineous Exudate Color: red, brown Foul Odor After Cleansing: No Slough/Fibrino No Wound Bed Granulation Amount: Small (1-33%) Exposed Structure Granulation Quality: Red Fascia Exposed: No Necrotic Amount: Large (67-100%) Fat Layer (Subcutaneous Tissue) Exposed: Yes Necrotic Quality: Eschar Tendon Exposed: No Muscle Exposed: No Joint Exposed: No Bone Exposed: No Treatment Notes Wound #10 (Toe Great) Wound Laterality: Right, Medial Cleanser Soap and Water Discharge Instruction: May shower and wash wound with dial antibacterial soap and water prior to dressing change. Wound Cleanser Discharge Instruction: Cleanse the wound with wound cleanser prior to applying a clean dressing using gauze sponges, not tissue or cotton balls. Peri-Wound Care Sween Lotion (Moisturizing lotion) Discharge Instruction: Apply moisturizing lotion as directed Topical Primary Dressing KerraCel Ag Gelling Fiber Dressing, 2x2 in  (silver alginate) Discharge Instruction: Apply silver alginate to wound bed as instructed Secondary Dressing Woven Gauze Sponge, Non-Sterile 4x4 in Discharge Instruction: Apply over primary dressing as directed. Secured With Conforming Stretch Gauze Bandage, Sterile 2x75 (in/in) Discharge Instruction: Secure with stretch gauze as directed. 41M Medipore H Soft Cloth Surgical T ape, 4 x 10 (in/yd) Discharge Instruction: Secure with tape as directed. Compression Wrap Compression Stockings Add-Ons Electronic Signature(s) Signed: 09/02/2021 5:16:25 PM By: Adline Peals Entered By: Adline Peals on 09/02/2021 13:04:13 -------------------------------------------------------------------------------- Wound Assessment Details Patient Name: Date of Service: Christina Asa 09/02/2021 12:45 PM Medical Record Number: [id number] Patient Account Number: [id number] Date of Birth/Sex: Treating RN: [birth year] (76 y.o. Harlow Ohms Primary Care Branko Steeves: Bing Matter Other Clinician: Referring Tyrice Hewitt: Treating Edom Schmuhl/Extender: Willodean Rosenthal Weeks in Treatment: 12 Wound Status Wound Number: 12 Primary Pressure Ulcer Etiology: Wound Location: Sacrum Wound Open Wounding Event: Pressure Injury Status: Date Acquired: 08/15/2021 Comorbid Chronic sinus problems/congestion, Anemia, Chronic Obstructive  Weeks Of Treatment: 1 History: Pulmonary Disease (COPD), Congestive Heart Failure, Clustered Wound: No Hypertension, Peripheral Venous Disease, Type II Diabetes, Dementia, Neuropathy Photos Wound Measurements Length: (cm) 1.2 Width: (cm) 0.8 Depth: (cm) 0.1 Area: (cm) 0.754 Volume: (cm) 0.075 Wound Description Classification: Category/Stage II Wound Margin: Distinct, outline attached Exudate Amount: Medium Exudate Type: Serosanguineous Exudate Color: red, brown Foul Odor After Cleansing: Slough/Fibrino % Reduction in Area: 43.2% % Reduction in  Volume: 43.6% Epithelialization: Small (1-33%) Tunneling: No Undermining: No No Yes Wound Bed Granulation Amount: Small (1-33%) Exposed Structure Granulation Quality: Red Fascia Exposed: No Necrotic Amount: Large (67-100%) Fat Layer (Subcutaneous Tissue) Exposed: Yes Necrotic Quality: Adherent Slough Tendon Exposed: No Muscle Exposed: No Joint Exposed: No Bone Exposed: No Treatment Notes Wound #12 (Sacrum) Cleanser Soap and Water Discharge Instruction: May shower and wash wound with dial antibacterial soap and water prior to dressing change. Wound Cleanser Discharge Instruction: Cleanse the wound with wound cleanser prior to applying a clean dressing using gauze sponges, not tissue or cotton balls. Peri-Wound Care Zinc Oxide Ointment 30g tube Discharge Instruction: Apply Zinc Oxide to periwound with each dressing change Topical Primary Dressing Iodosorb Gel 10 (gm) Tube Discharge Instruction: Apply to wound bed as instructed Secondary Dressing Bordered Gauze, 4x4 in Discharge Instruction: Apply over primary dressing as directed. Woven Gauze Sponge, Non-Sterile 4x4 in Discharge Instruction: Apply over primary dressing as directed. Secured With Compression Wrap Compression Stockings Add-Ons Electronic Signature(s) Signed: 09/02/2021 5:16:25 PM By: Adline Peals Entered By: Adline Peals on 09/02/2021 13:04:58 -------------------------------------------------------------------------------- Wound Assessment Details Patient Name: Date of Service: Christina Asa 09/02/2021 12:45 PM Medical Record Number: [id number] Patient Account Number: [id number] Date of Birth/Sex: Treating RN: 12-09-1945 (76 y.o. Harlow Ohms Primary Care Itai Barbian: Bing Matter Other Clinician: Referring Ivon Oelkers: Treating Akacia Boltz/Extender: Willodean Rosenthal Weeks in Treatment: 12 Wound Status Wound Number: 13 Primary Abrasion Etiology: Wound Location:  Right, Lateral Foot Wound Open Wounding Event: Trauma Status: Date Acquired: 09/02/2021 Comorbid Chronic sinus problems/congestion, Anemia, Chronic Obstructive Weeks Of Treatment: 0 History: Pulmonary Disease (COPD), Congestive Heart Failure, Clustered Wound: No Hypertension, Peripheral Venous Disease, Type II Diabetes, Dementia, Neuropathy Wound Measurements Length: (cm) 0.7 Width: (cm) 0.5 Depth: (cm) 0.1 Area: (cm) 0.275 Volume: (cm) 0.027 % Reduction in Area: % Reduction in Volume: Epithelialization: Medium (34-66%) Tunneling: No Undermining: No Wound Description Classification: Full Thickness Without Exposed Support Structures Wound Margin: Distinct, outline attached Exudate Amount: Medium Exudate Type: Serosanguineous Exudate Color: red, brown Foul Odor After Cleansing: No Slough/Fibrino No Wound Bed Granulation Amount: Large (67-100%) Exposed Structure Granulation Quality: Red Fascia Exposed: No Necrotic Amount: None Present (0%) Fat Layer (Subcutaneous Tissue) Exposed: Yes Tendon Exposed: No Muscle Exposed: No Joint Exposed: No Bone Exposed: No Treatment Notes Wound #13 (Foot) Wound Laterality: Right, Lateral Cleanser Soap and Water Discharge Instruction: May shower and wash wound with dial antibacterial soap and water prior to dressing change. Wound Cleanser Discharge Instruction: Cleanse the wound with wound cleanser prior to applying a clean dressing using gauze sponges, not tissue or cotton balls. Peri-Wound Care Sween Lotion (Moisturizing lotion) Discharge Instruction: Apply moisturizing lotion as directed Topical Primary Dressing KerraCel Ag Gelling Fiber Dressing, 2x2 in (silver alginate) Discharge Instruction: Apply silver alginate to wound bed as instructed Secondary Dressing Woven Gauze Sponge, Non-Sterile 4x4 in Discharge Instruction: Apply over primary dressing as directed. Secured With Compression Wrap Kerlix Roll 4.5x3.1  (in/yd) Discharge Instruction: Apply Kerlix and Coban compression as directed. Coban Self-Adherent Wrap 4x5 (in/yd) Discharge Instruction: Apply over  Kerlix as directed. Compression Stockings Add-Ons Electronic Signature(s) Signed: 09/02/2021 5:16:25 PM By: Adline Peals Entered By: Adline Peals on 09/02/2021 13:20:09 -------------------------------------------------------------------------------- Wound Assessment Details Patient Name: Date of Service: Christina Asa 09/02/2021 12:45 PM Medical Record Number: [id number] Patient Account Number: [id number] Date of Birth/Sex: Treating RN: [birth year] (76 y.o. Harlow Ohms Primary Care Flonnie Wierman: Bing Matter Other Clinician: Referring Keishawn Rajewski: Treating Jenisha Faison/Extender: Donia Ast in Treatment: 12 Wound Status Wound Number: 14 Primary Abrasion Etiology: Wound Location: Left Metatarsal head first Wound Open Wounding Event: Trauma Status: Date Acquired: 09/02/2021 Comorbid Chronic sinus problems/congestion, Anemia, Chronic Obstructive Weeks Of Treatment: 0 History: Pulmonary Disease (COPD), Congestive Heart Failure, Clustered Wound: No Hypertension, Peripheral Venous Disease, Type II Diabetes, Dementia, Neuropathy Wound Measurements Length: (cm) 0.5 Width: (cm) 0.6 Depth: (cm) 0.1 Area: (cm) 0.236 Volume: (cm) 0.024 % Reduction in Area: % Reduction in Volume: Epithelialization: Small (1-33%) Tunneling: No Undermining: No Wound Description Classification: Full Thickness Without Exposed Support Structures Wound Margin: Distinct, outline attached Exudate Amount: Medium Exudate Type: Serosanguineous Exudate Color: red, brown Foul Odor After Cleansing: No Slough/Fibrino No Wound Bed Granulation Amount: Large (67-100%) Exposed Structure Granulation Quality: Red Fascia Exposed: No Necrotic Amount: Small (1-33%) Fat Layer (Subcutaneous Tissue) Exposed: Yes Necrotic  Quality: Eschar Tendon Exposed: No Muscle Exposed: No Joint Exposed: No Bone Exposed: No Treatment Notes Wound #14 (Metatarsal head first) Wound Laterality: Left Cleanser Soap and Water Discharge Instruction: May shower and wash wound with dial antibacterial soap and water prior to dressing change. Wound Cleanser Discharge Instruction: Cleanse the wound with wound cleanser prior to applying a clean dressing using gauze sponges, not tissue or cotton balls. Peri-Wound Care Sween Lotion (Moisturizing lotion) Discharge Instruction: Apply moisturizing lotion as directed Topical Primary Dressing KerraCel Ag Gelling Fiber Dressing, 2x2 in (silver alginate) Discharge Instruction: Apply silver alginate to wound bed as instructed Secondary Dressing Bordered Gauze, 2x2 in Discharge Instruction: Apply over primary dressing as directed. Secured With Compression Wrap Compression Stockings Add-Ons Electronic Signature(s) Signed: 09/02/2021 5:16:25 PM By: Adline Peals Entered By: Adline Peals on 09/02/2021 13:22:14 -------------------------------------------------------------------------------- Wound Assessment Details Patient Name: Date of Service: Christina Asa 09/02/2021 12:45 PM Medical Record Number: [id number] Patient Account Number: [id number] Date of Birth/Sex: Treating RN: 1945-04-08 (76 y.o. Harlow Ohms Primary Care Jordie Skalsky: Bing Matter Other Clinician: Referring Jakye Mullens: Treating Tarnesha Ulloa/Extender: Donia Ast in Treatment: 12 Wound Status Wound Number: 3 Primary Diabetic Wound/Ulcer of the Lower Extremity Etiology: Wound Location: Left, Anterior Lower Leg Wound Healed - Epithelialized Wounding Event: Gradually Appeared Status: Date Acquired: 05/05/2021 Comorbid Chronic sinus problems/congestion, Anemia, Chronic Obstructive Weeks Of Treatment: 12 History: Pulmonary Disease (COPD), Congestive Heart Failure, Clustered  Wound: Yes Hypertension, Peripheral Venous Disease, Type II Diabetes, Dementia, Neuropathy Photos Wound Measurements Length: (cm) Width: (cm) Depth: (cm) Area: (cm) Volume: (cm) 0 % Reduction in Area: 100% 0 % Reduction in Volume: 100% 0 Epithelialization: Large (67-100%) 0 Tunneling: No 0 Undermining: No Wound Description Classification: Grade 2 Wound Margin: Flat and Intact Exudate Amount: None Present Foul Odor After Cleansing: No Slough/Fibrino No Wound Bed Granulation Amount: None Present (0%) Exposed Structure Necrotic Amount: None Present (0%) Fascia Exposed: No Fat Layer (Subcutaneous Tissue) Exposed: No Tendon Exposed: No Muscle Exposed: No Joint Exposed: No Bone Exposed: No Electronic Signature(s) Signed: 09/02/2021 5:16:25 PM By: Adline Peals Entered By: Adline Peals on 09/02/2021 13:18:14 -------------------------------------------------------------------------------- Wound Assessment Details Patient Name: Date of Service: Christina Angst L. 09/02/2021 12:45 PM Medical Record  Number: [id number] Patient Account Number: [id number] Date of Birth/Sex: Treating RN: May 24, 1945 (76 y.o. Harlow Ohms Primary Care Diarra Ceja: Bing Matter Other Clinician: Referring Rayah Fines: Treating Jamiaya Bina/Extender: Willodean Rosenthal Weeks in Treatment: 12 Wound Status Wound Number: 9 Primary Abrasion Etiology: Wound Location: Right, Lateral Knee Wound Open Wounding Event: Shear/Friction Status: Date Acquired: 08/18/2021 Comorbid Chronic sinus problems/congestion, Anemia, Chronic Obstructive Weeks Of Treatment: 2 History: Pulmonary Disease (COPD), Congestive Heart Failure, Clustered Wound: No Hypertension, Peripheral Venous Disease, Type II Diabetes, Dementia, Neuropathy Photos Wound Measurements Length: (cm) 1 Width: (cm) 0.5 Depth: (cm) 0.1 Area: (cm) 0.393 Volume: (cm) 0.039 % Reduction in Area: 50.9% % Reduction in Volume:  51.3% Epithelialization: Large (67-100%) Tunneling: No Undermining: No Wound Description Classification: Full Thickness Without Exposed Support Structures Wound Margin: Distinct, outline attached Exudate Amount: None Present Foul Odor After Cleansing: No Slough/Fibrino No Wound Bed Granulation Amount: Small (1-33%) Exposed Structure Granulation Quality: Red Fascia Exposed: No Necrotic Amount: Large (67-100%) Fat Layer (Subcutaneous Tissue) Exposed: Yes Necrotic Quality: Eschar Tendon Exposed: No Muscle Exposed: No Joint Exposed: No Bone Exposed: No Treatment Notes Wound #9 (Knee) Wound Laterality: Right, Lateral Cleanser Soap and Water Discharge Instruction: May shower and wash wound with dial antibacterial soap and water prior to dressing change. Peri-Wound Care Topical Primary Dressing KerraCel Ag Gelling Fiber Dressing, 2x2 in (silver alginate) Discharge Instruction: Apply silver alginate to wound bed as instructed Secondary Dressing Woven Gauze Sponge, Non-Sterile 4x4 in Discharge Instruction: Apply over primary dressing as directed. Secured With [employer] 4x5 (in/yd) Discharge Instruction: Secure with Coban as directed. Kerlix Roll Sterile, 4.5x3.1 (in/yd) Discharge Instruction: Secure with Kerlix as directed. Compression Wrap Compression Stockings Add-Ons Electronic Signature(s) Signed: 09/02/2021 5:16:25 PM By: Adline Peals Entered By: Adline Peals on 09/02/2021 13:06:08 -------------------------------------------------------------------------------- Vitals Details Patient Name: Date of Service: Christina Asa. 09/02/2021 12:45 PM Medical Record Number: [id number] Patient Account Number: [id number] Date of Birth/Sex: Treating RN: [birth year] (76 y.o. Harlow Ohms Primary Care Nycere Presley: Bing Matter Other Clinician: Referring Zoeya Gramajo: Treating Ramel Tobon/Extender: Donia Ast in Treatment:  12 Vital Signs Time Taken: 12:52 Temperature (F): 97.6 Height (in): 67 Pulse (bpm): 150 Weight (lbs): 183 Respiratory Rate (breaths/min): 20 Body Mass Index (BMI): 28.7 Blood Pressure (mmHg): 93/63 Reference Range: 80 - 120 mg / dl Electronic Signature(s) Signed: 09/02/2021 5:16:25 PM By: Adline Peals Entered By: Adline Peals on 09/02/2021 12:52:58

## 2021-09-07 ENCOUNTER — Encounter: Payer: Self-pay | Admitting: *Deleted

## 2021-09-07 ENCOUNTER — Other Ambulatory Visit: Payer: Self-pay | Admitting: *Deleted

## 2021-09-07 MED ORDER — METOPROLOL TARTRATE 50 MG PO TABS: 50.0000 mg | ORAL_TABLET | Freq: Two times a day (BID) | ORAL | 3 refills | Status: AC

## 2021-09-09 ENCOUNTER — Encounter (HOSPITAL_BASED_OUTPATIENT_CLINIC_OR_DEPARTMENT_OTHER): Payer: 59 | Admitting: General Surgery

## 2021-09-09 DIAGNOSIS — E11621 Type 2 diabetes mellitus with foot ulcer: Secondary | ICD-10-CM | POA: Diagnosis not present

## 2021-09-09 NOTE — Progress Notes (Signed)
MERRELL, RETTINGER ([id number]) Visit Report for 09/09/2021 Arrival Information Details Patient Name: Date of Service: [place], [name] 09/09/2021 10:45 A M Medical Record Number: [id number] Patient Account Number: [id number] Date of Birth/Sex: Treating RN: 1945/08/22 (76 y.o. Harlow Ohms Primary Care Teyonna Plaisted: Bing Matter Other Clinician: Referring Arriyana Rodell: Treating Liylah Najarro/Extender: Donia Ast in Treatment: 13 Visit Information History Since Last Visit Added or deleted any medications: No Patient Arrived: Wheel Chair Any new allergies or adverse reactions: No Arrival Time: 11:20 Had a fall or experienced change in No Accompanied By: caregiver activities of daily living that may affect Transfer Assistance: Manual risk of falls: Patient Identification Verified: Yes Signs or symptoms of abuse/neglect since last visito No Secondary Verification Process Completed: Yes Hospitalized since last visit: No Patient Has Alerts: Yes Implantable device outside of the clinic excluding No Patient Alerts: Patient on Blood Thinner cellular tissue based products placed in the center R ABI non compressible since last visit: L ABI non compressible Has Dressing in Place as Prescribed: Yes Has Compression in Place as Prescribed: Yes Pain Present Now: Yes Electronic Signature(s) Signed: 09/09/2021 4:29:02 PM By: Adline Peals Entered By: Adline Peals on 09/09/2021 11:20:26 -------------------------------------------------------------------------------- Encounter Discharge Information Details Patient Name: Date of Service: [name]. 09/09/2021 10:45 A M Medical Record Number: [id number] Patient Account Number: [id number] Date of Birth/Sex: Treating RN: 08-28-1945 (76 y.o. Harlow Ohms Primary Care Zyaire Dumas: Bing Matter Other Clinician: Referring Elek Holderness: Treating Ethyn Schetter/Extender: Donia Ast in  Treatment: 13 Encounter Discharge Information Items Post Procedure Vitals Discharge Condition: Stable Temperature (F): 98 Ambulatory Status: Wheelchair Pulse (bpm): 102 Discharge Destination: Home Respiratory Rate (breaths/min): 18 Transportation: Private Auto Blood Pressure (mmHg): 113/69 Accompanied By: caregiver Schedule Follow-up Appointment: Yes Clinical Summary of Care: Patient Declined Electronic Signature(s) Signed: 09/09/2021 4:29:02 PM By: Adline Peals Entered By: Adline Peals on 09/09/2021 12:40:46 -------------------------------------------------------------------------------- Lower Extremity Assessment Details Patient Name: Date of Service: CHAIRTY, TOM[place] 09/09/2021 10:45 A M Medical Record Number: [id number] Patient Account Number: [id number] Date of Birth/Sex: Treating RN: 30-Jul-1945 (76 y.o. Harlow Ohms Primary Care Jagger Demonte: Bing Matter Other Clinician: Referring Lamarcus Spira: Treating Emmaleigh Longo/Extender: Willodean Rosenthal Weeks in Treatment: 13 Edema Assessment Assessed: Shirlyn Goltz: No] [Right: No] E[Left: dema] [Right: :] Calf Left: Right: Point of Measurement: 28 cm From Medial Instep 25.3 cm 22.8 cm Ankle Left: Right: Point of Measurement: 11 cm From Medial Instep 19.1 cm 17.5 cm Vascular Assessment Pulses: Dorsalis Pedis Palpable: [Left:Yes] [Right:Yes] Electronic Signature(s) Signed: 09/09/2021 4:29:02 PM By: Adline Peals Entered By: Adline Peals on 09/09/2021 11:27:37 -------------------------------------------------------------------------------- Multi Wound Chart Details Patient Name: Date of Service: [name]. 09/09/2021 10:45 A M Medical Record Number: [id number] Patient Account Number: [id number] Date of Birth/Sex: Treating RN: 1945/11/06 (76 y.o. Harlow Ohms Primary Care Kati Riggenbach: Bing Matter Other Clinician: Referring Mckennon Zwart: Treating Xaniyah Buchholz/Extender: Donia Ast in Treatment: 13 Vital Signs Height(in): 70 Pulse(bpm): 102 Weight(lbs): 183 Blood Pressure(mmHg): 113/69 Body Mass Index(BMI): 28.7 Temperature(F): 98 Respiratory Rate(breaths/min): 18 Photos: Right, Medial T Great oe Sacrum Right, Lateral Foot Wound Location: Gradually Appeared Pressure Injury Trauma Wounding Event: Diabetic Wound/Ulcer of the Lower Pressure Ulcer Abrasion Primary Etiology: Extremity Chronic sinus problems/congestion, Chronic sinus problems/congestion,Chronic sinus problems/congestion, Comorbid History: Anemia, Chronic Obstructive Anemia, Chronic Obstructive Anemia, Chronic Obstructive Pulmonary Disease (COPD), Pulmonary Disease (COPD), Pulmonary Disease (COPD), Congestive Heart Failure, Congestive Heart Failure, Congestive Heart Failure, Hypertension, Peripheral Venous Hypertension, Peripheral Venous Hypertension, Peripheral Venous Disease,  Type II Diabetes, Dementia, Disease, Type II Diabetes, Dementia, Disease, Type II Diabetes, Dementia, Neuropathy Neuropathy Neuropathy 08/18/2021 08/15/2021 09/02/2021 Date Acquired: '3 2 1 '$ Weeks of Treatment: Open Open Open Wound Status: No No No Wound Recurrence: 0.5x0.5x0.1 1.4x0.9x0.1 0.8x0.5x0.1 Measurements L x W x D (cm) 0.196 0.99 0.314 A (cm) : rea 0.02 0.099 0.031 Volume (cm) : -317.00% 25.40% -14.20% % Reduction in A rea: -300.00% 25.60% -14.80% % Reduction in Volume: Grade 2 Category/Stage II Full Thickness Without Exposed Classification: Support Structures Medium Medium Medium Exudate A mount: Serosanguineous Serosanguineous Serosanguineous Exudate Type: red, brown red, brown red, brown Exudate Color: Distinct, outline attached Distinct, outline attached Distinct, outline attached Wound Margin: Small (1-33%) Small (1-33%) Small (1-33%) Granulation A mount: Red Red Red Granulation Quality: Large (67-100%) Large (67-100%) Large (67-100%) Necrotic A  mount: Adherent Slough Adherent [employer] Necrotic Tissue: Fat Layer (Subcutaneous Tissue): Yes Fat Layer (Subcutaneous Tissue): Yes Fat Layer (Subcutaneous Tissue): Yes Exposed Structures: Fascia: No Fascia: No Fascia: No Tendon: No Tendon: No Tendon: No Muscle: No Muscle: No Muscle: No Joint: No Joint: No Joint: No Bone: No Bone: No Bone: No Large (67-100%) Small (1-33%) Medium (34-66%) Epithelialization: Debridement - Excisional Debridement - Excisional Debridement - Excisional Debridement: Pre-procedure Verification/Time Out 11:49 11:49 11:49 Taken: Other Other Other Pain Control: Subcutaneous, Slough Subcutaneous, Slough Subcutaneous, [employer] Tissue Debrided: Skin/Subcutaneous Tissue Skin/Subcutaneous Tissue Skin/Subcutaneous Tissue Level: 0.25 1.26 0.4 Debridement A (sq cm): rea Curette Curette Curette Instrument: Minimum Minimum Minimum Bleeding: Pressure Pressure Pressure Hemostasis A chieved: 0 0 0 Procedural Pain: 0 0 0 Post Procedural Pain: Procedure was tolerated well Procedure was tolerated well Procedure was tolerated well Debridement Treatment Response: 0.5x0.5x0.1 1.4x0.9x0.1 0.8x0.5x0.1 Post Debridement Measurements L x W x D (cm) 0.02 0.099 0.031 Post Debridement Volume: (cm) N/A Category/Stage II N/A Post Debridement Stage: Debridement Debridement Debridement Procedures Performed: Wound Number: 14 9 N/A Photos: N/A Left Metatarsal head first Right, Lateral Knee N/A Wound Location: Trauma Shear/Friction N/A Wounding Event: Abrasion Abrasion N/A Primary Etiology: Chronic sinus problems/congestion, Chronic sinus problems/congestion, N/A Comorbid History: Anemia, Chronic Obstructive Anemia, Chronic Obstructive Pulmonary Disease (COPD), Pulmonary Disease (COPD), Congestive Heart Failure, Congestive Heart Failure, Hypertension, Peripheral Venous Hypertension, Peripheral Venous Disease, Type II Diabetes, Dementia, Disease,  Type II Diabetes, Dementia, Neuropathy Neuropathy 09/02/2021 08/18/2021 N/A Date Acquired: 1 3 N/A Weeks of Treatment: Open Open N/A Wound Status: No No N/A Wound Recurrence: 0x0x0 1x0.3x0.1 N/A Measurements L x W x D (cm) 0 0.236 N/A A (cm) : rea 0 0.024 N/A Volume (cm) : 100.00% 70.50% N/A % Reduction in Area: 100.00% 70.00% N/A % Reduction in Volume: Full Thickness Without Exposed Full Thickness Without Exposed N/A Classification: Support Structures Support Structures None Present None Present N/A Exudate A mount: N/A N/A N/A Exudate Type: N/A N/A N/A Exudate Color: Distinct, outline attached Distinct, outline attached N/A Wound Margin: None Present (0%) Small (1-33%) N/A Granulation Amount: N/A Red N/A Granulation Quality: None Present (0%) Large (67-100%) N/A Necrotic Amount: N/A Eschar N/A Necrotic Tissue: Fascia: No Fat Layer (Subcutaneous Tissue): Yes N/A Exposed Structures: Fat Layer (Subcutaneous Tissue): No Fascia: No Tendon: No Tendon: No Muscle: No Muscle: No Joint: No Joint: No Bone: No Bone: No Large (67-100%) Large (67-100%) N/A Epithelialization: N/A Debridement - Selective/Open Wound N/A Debridement: Pre-procedure Verification/Time Out N/A 11:49 N/A Taken: N/A Other N/A Pain Control: N/A Necrotic/Eschar N/A Tissue Debrided: N/A Non-Viable Tissue N/A Level: N/A 0.3 N/A Debridement A (sq cm): rea N/A Curette N/A Instrument: N/A Minimum N/A  Bleeding: N/A Pressure N/A Hemostasis A chieved: N/A 0 N/A Procedural Pain: N/A 0 N/A Post Procedural Pain: N/A Procedure was tolerated well N/A Debridement Treatment Response: N/A 1x0.3x0.1 N/A Post Debridement Measurements L x W x D (cm) N/A 0.024 N/A Post Debridement Volume: (cm) N/A N/A N/A Post Debridement Stage: N/A Debridement N/A Procedures Performed: Treatment Notes Electronic Signature(s) Signed: 09/09/2021 12:26:38 PM By: Fredirick Maudlin MD FACS Signed: 09/09/2021  4:29:02 PM By: Adline Peals Entered By: Fredirick Maudlin on 09/09/2021 12:26:38 -------------------------------------------------------------------------------- Multi-Disciplinary Care Plan Details Patient Name: Date of Service: [name]. 09/09/2021 10:45 A M Medical Record Number: [id number] Patient Account Number: [id number] Date of Birth/Sex: Treating RN: 08/17/1945 (76 y.o. Harlow Ohms Primary Care Andromeda Poppen: Bing Matter Other Clinician: Referring Tory Mckissack: Treating Laster Appling/Extender: Donia Ast in Treatment: 13 Multidisciplinary Care Plan reviewed with physician Active Inactive Abuse / Safety / Falls / Self Care Management Nursing Diagnoses: Potential for falls Potential for injury related to falls Goals: Patient will not experience any injury related to falls Date Initiated: 06/09/2021 Target Resolution Date: 09/15/2021 Goal Status: Active Patient/caregiver will verbalize/demonstrate measures taken to prevent injury and/or falls Date Initiated: 06/09/2021 Target Resolution Date: 09/15/2021 Goal Status: Active Interventions: Assess Activities of Daily Living upon admission and as needed Assess fall risk on admission and as needed Assess: immobility, friction, shearing, incontinence upon admission and as needed Assess impairment of mobility on admission and as needed per policy Assess personal safety and home safety (as indicated) on admission and as needed Assess self care needs on admission and as needed Provide education on fall prevention Provide education on personal and home safety Notes: Nutrition Nursing Diagnoses: Potential for alteratiion in Nutrition/Potential for imbalanced nutrition Goals: Patient/caregiver agrees to and verbalizes understanding of need to use nutritional supplements and/or vitamins as prescribed Date Initiated: 06/09/2021 Target Resolution Date: 09/15/2021 Goal Status:  Active Patient/caregiver will maintain therapeutic glucose control Date Initiated: 06/09/2021 Target Resolution Date: 09/15/2021 Goal Status: Active Interventions: Assess HgA1c results as ordered upon admission and as needed Assess patient nutrition upon admission and as needed per policy Provide education on elevated blood sugars and impact on wound healing Provide education on nutrition Treatment Activities: Education provided on Nutrition : 06/09/2021 Notes: Wound/Skin Impairment Nursing Diagnoses: Impaired tissue integrity Knowledge deficit related to ulceration/compromised skin integrity Goals: Patient/caregiver will verbalize understanding of skin care regimen Date Initiated: 06/09/2021 Target Resolution Date: 09/15/2021 Goal Status: Active Interventions: Assess patient/caregiver ability to obtain necessary supplies Assess patient/caregiver ability to perform ulcer/skin care regimen upon admission and as needed Assess ulceration(s) every visit Provide education on ulcer and skin care Notes: Electronic Signature(s) Signed: 09/09/2021 4:29:02 PM By: Adline Peals Entered By: Adline Peals on 09/09/2021 11:34:22 -------------------------------------------------------------------------------- Pain Assessment Details Patient Name: Date of Service: [name]. 09/09/2021 10:45 A M Medical Record Number: [id number] Patient Account Number: [id number] Date of Birth/Sex: Treating RN: 11/16/1945 (76 y.o. Harlow Ohms Primary Care Maykel Reitter: Bing Matter Other Clinician: Referring Janaa Acero: Treating Masiah Woody/Extender: Donia Ast in Treatment: 13 Active Problems Location of Pain Severity and Description of Pain Patient Has Paino Yes Site Locations Pain Location: Pain in Ulcers Rate the pain. Current Pain Level: 10 Character of Pain Describe the Pain: Aching Pain Management and Medication Current Pain Management: Medication:  Yes Electronic Signature(s) Signed: 09/09/2021 4:29:02 PM By: Adline Peals Entered By: Adline Peals on 09/09/2021 11:21:28 -------------------------------------------------------------------------------- Patient/Caregiver Education Details Patient Name: Date of Service: Gerlean Ren, [place] 6/8/2023andnbsp10:45 A M Medical Record  Number: [id number] Patient Account Number: [id number] Date of Birth/Gender: Treating RN: 1945/05/20 (76 y.o. Harlow Ohms Primary Care Physician: Bing Matter Other Clinician: Referring Physician: Treating Physician/Extender: Donia Ast in Treatment: 13 Education Assessment Education Provided To: Patient Education Topics Provided Wound/Skin Impairment: Methods: Explain/Verbal Responses: Reinforcements needed, State content correctly Electronic Signature(s) Signed: 09/09/2021 4:29:02 PM By: Adline Peals Entered By: Adline Peals on 09/09/2021 11:34:36 -------------------------------------------------------------------------------- Wound Assessment Details Patient Name: Date of Service: [name]. 09/09/2021 10:45 A M Medical Record Number: [id number] Patient Account Number: [id number] Date of Birth/Sex: Treating RN: [birth year] (76 y.o. Harlow Ohms Primary Care Beryl Balz: Bing Matter Other Clinician: Referring Tyler Cubit: Treating Delonta Yohannes/Extender: Donia Ast in Treatment: 13 Wound Status Wound Number: 10 Primary Diabetic Wound/Ulcer of the Lower Extremity Etiology: Wound Location: Right, Medial T Great oe Wound Open Wounding Event: Gradually Appeared Status: Date Acquired: 08/18/2021 Comorbid Chronic sinus problems/congestion, Anemia, Chronic Obstructive Weeks Of Treatment: 3 History: Pulmonary Disease (COPD), Congestive Heart Failure, Clustered Wound: No Hypertension, Peripheral Venous Disease, Type II Diabetes, Dementia,  Neuropathy Photos Wound Measurements Length: (cm) 0.5 Width: (cm) 0.5 Depth: (cm) 0.1 Area: (cm) 0.196 Volume: (cm) 0.02 % Reduction in Area: -317% % Reduction in Volume: -300% Epithelialization: Large (67-100%) Tunneling: No Undermining: No Wound Description Classification: Grade 2 Wound Margin: Distinct, outline attached Exudate Amount: Medium Exudate Type: Serosanguineous Exudate Color: red, brown Foul Odor After Cleansing: No Slough/Fibrino Yes Wound Bed Granulation Amount: Small (1-33%) Exposed Structure Granulation Quality: Red Fascia Exposed: No Necrotic Amount: Large (67-100%) Fat Layer (Subcutaneous Tissue) Exposed: Yes Necrotic Quality: Adherent Slough Tendon Exposed: No Muscle Exposed: No Joint Exposed: No Bone Exposed: No Treatment Notes Wound #10 (Toe Great) Wound Laterality: Right, Medial Cleanser Soap and Water Discharge Instruction: May shower and wash wound with dial antibacterial soap and water prior to dressing change. Wound Cleanser Discharge Instruction: Cleanse the wound with wound cleanser prior to applying a clean dressing using gauze sponges, not tissue or cotton balls. Peri-Wound Care Sween Lotion (Moisturizing lotion) Discharge Instruction: Apply moisturizing lotion as directed Topical Primary Dressing KerraCel Ag Gelling Fiber Dressing, 2x2 in (silver alginate) Discharge Instruction: Apply silver alginate to wound bed as instructed Secondary Dressing Woven Gauze Sponge, Non-Sterile 4x4 in Discharge Instruction: Apply over primary dressing as directed. Secured With Conforming Stretch Gauze Bandage, Sterile 2x75 (in/in) Discharge Instruction: Secure with stretch gauze as directed. 26M Medipore H Soft Cloth Surgical T ape, 4 x 10 (in/yd) Discharge Instruction: Secure with tape as directed. Compression Wrap Compression Stockings Add-Ons Electronic Signature(s) Signed: 09/09/2021 4:29:02 PM By: Adline Peals Entered By: Adline Peals on 09/09/2021 11:39:07 -------------------------------------------------------------------------------- Wound Assessment Details Patient Name: Date of Service: [name]. 09/09/2021 10:45 A M Medical Record Number: [id number] Patient Account Number: [id number] Date of Birth/Sex: Treating RN: 24-Apr-1945 (76 y.o. Harlow Ohms Primary Care Omah Dewalt: Bing Matter Other Clinician: Referring Viridiana Spaid: Treating Kerri Kovacik/Extender: Donia Ast in Treatment: 13 Wound Status Wound Number: 12 Primary Pressure Ulcer Etiology: Wound Location: Sacrum Wound Open Wounding Event: Pressure Injury Status: Date Acquired: 08/15/2021 Comorbid Chronic sinus problems/congestion, Anemia, Chronic Obstructive Weeks Of Treatment: 2 History: Pulmonary Disease (COPD), Congestive Heart Failure, Clustered Wound: No Hypertension, Peripheral Venous Disease, Type II Diabetes, Dementia, Neuropathy Photos Wound Measurements Length: (cm) 1.4 Width: (cm) 0.9 Depth: (cm) 0.1 Area: (cm) 0.99 Volume: (cm) 0.099 Wound Description Classification: Category/Stage II Wound Margin: Distinct, outline attached Exudate Amount: Medium Exudate Type: Serosanguineous Exudate Color: red, brown Foul Odor  After Cleansing: Slough/Fibrino % Reduction in Area: 25.4% % Reduction in Volume: 25.6% Epithelialization: Small (1-33%) Tunneling: No Undermining: No No Yes Wound Bed Granulation Amount: Small (1-33%) Exposed Structure Granulation Quality: Red Fascia Exposed: No Necrotic Amount: Large (67-100%) Fat Layer (Subcutaneous Tissue) Exposed: Yes Necrotic Quality: Adherent Slough Tendon Exposed: No Muscle Exposed: No Joint Exposed: No Bone Exposed: No Treatment Notes Wound #12 (Sacrum) Cleanser Soap and Water Discharge Instruction: May shower and wash wound with dial antibacterial soap and water prior to dressing change. Wound Cleanser Discharge Instruction:  Cleanse the wound with wound cleanser prior to applying a clean dressing using gauze sponges, not tissue or cotton balls. Peri-Wound Care Zinc Oxide Ointment 30g tube Discharge Instruction: Apply Zinc Oxide to periwound with each dressing change Topical Primary Dressing Iodosorb Gel 10 (gm) Tube Discharge Instruction: Apply to wound bed as instructed Secondary Dressing Bordered Gauze, 4x4 in Discharge Instruction: Apply over primary dressing as directed. Woven Gauze Sponge, Non-Sterile 4x4 in Discharge Instruction: Apply over primary dressing as directed. Secured With Compression Wrap Compression Stockings Add-Ons Electronic Signature(s) Signed: 09/09/2021 4:29:02 PM By: Adline Peals Entered By: Adline Peals on 09/09/2021 11:38:37 -------------------------------------------------------------------------------- Wound Assessment Details Patient Name: Date of Service: [name]. 09/09/2021 10:45 A M Medical Record Number: [id number] Patient Account Number: [id number] Date of Birth/Sex: Treating RN: March 28, 1946 (76 y.o. Harlow Ohms Primary Care Martha Ellerby: Bing Matter Other Clinician: Referring Joory Gough: Treating Amarisa Wilinski/Extender: Donia Ast in Treatment: 13 Wound Status Wound Number: 13 Primary Abrasion Etiology: Wound Location: Right, Lateral Foot Wound Open Wounding Event: Trauma Status: Date Acquired: 09/02/2021 Comorbid Chronic sinus problems/congestion, Anemia, Chronic Obstructive Weeks Of Treatment: 1 History: Pulmonary Disease (COPD), Congestive Heart Failure, Clustered Wound: No Hypertension, Peripheral Venous Disease, Type II Diabetes, Dementia, Neuropathy Photos Wound Measurements Length: (cm) 0.8 Width: (cm) 0.5 Depth: (cm) 0.1 Area: (cm) 0.314 Volume: (cm) 0.031 % Reduction in Area: -14.2% % Reduction in Volume: -14.8% Epithelialization: Medium (34-66%) Tunneling: No Undermining: No Wound  Description Classification: Full Thickness Without Exposed Support Structures Wound Margin: Distinct, outline attached Exudate Amount: Medium Exudate Type: Serosanguineous Exudate Color: red, brown Foul Odor After Cleansing: No Slough/Fibrino No Wound Bed Granulation Amount: Small (1-33%) Exposed Structure Granulation Quality: Red Fascia Exposed: No Necrotic Amount: Large (67-100%) Fat Layer (Subcutaneous Tissue) Exposed: Yes Necrotic Quality: Adherent Slough Tendon Exposed: No Muscle Exposed: No Joint Exposed: No Bone Exposed: No Treatment Notes Wound #13 (Foot) Wound Laterality: Right, Lateral Cleanser Soap and Water Discharge Instruction: May shower and wash wound with dial antibacterial soap and water prior to dressing change. Wound Cleanser Discharge Instruction: Cleanse the wound with wound cleanser prior to applying a clean dressing using gauze sponges, not tissue or cotton balls. Peri-Wound Care Sween Lotion (Moisturizing lotion) Discharge Instruction: Apply moisturizing lotion as directed Topical Primary Dressing KerraCel Ag Gelling Fiber Dressing, 2x2 in (silver alginate) Discharge Instruction: Apply silver alginate to wound bed as instructed Secondary Dressing Woven Gauze Sponge, Non-Sterile 4x4 in Discharge Instruction: Apply over primary dressing as directed. Secured With Compression Wrap Kerlix Roll 4.5x3.1 (in/yd) Discharge Instruction: Apply Kerlix and Coban compression as directed. Coban Self-Adherent Wrap 4x5 (in/yd) Discharge Instruction: Apply over Kerlix as directed. Compression Stockings Add-Ons Electronic Signature(s) Signed: 09/09/2021 4:29:02 PM By: Adline Peals Entered By: Adline Peals on 09/09/2021 11:39:29 -------------------------------------------------------------------------------- Wound Assessment Details Patient Name: Date of Service: [name] 09/09/2021 10:45 A M Medical Record Number: [id number] Patient Account  Number: [id number] Date of Birth/Sex: Treating RN: 10-02-1945 (76 y.o. F)  Adline Peals Primary Care Surie Suchocki: Bing Matter Other Clinician: Referring Carlyann Placide: Treating Maxamillion Banas/Extender: Donia Ast in Treatment: 13 Wound Status Wound Number: 14 Primary Abrasion Etiology: Wound Location: Left Metatarsal head first Wound Open Wounding Event: Trauma Status: Date Acquired: 09/02/2021 Comorbid Chronic sinus problems/congestion, Anemia, Chronic Obstructive Weeks Of Treatment: 1 History: Pulmonary Disease (COPD), Congestive Heart Failure, Clustered Wound: No Hypertension, Peripheral Venous Disease, Type II Diabetes, Dementia, Neuropathy Photos Wound Measurements Length: (cm) Width: (cm) Depth: (cm) Area: (cm) Volume: (cm) 0 % Reduction in Area: 100% 0 % Reduction in Volume: 100% 0 Epithelialization: Large (67-100%) 0 Tunneling: No 0 Undermining: No Wound Description Classification: Full Thickness Without Exposed Support Structures Wound Margin: Distinct, outline attached Exudate Amount: None Present Foul Odor After Cleansing: No Slough/Fibrino No Wound Bed Granulation Amount: None Present (0%) Exposed Structure Necrotic Amount: None Present (0%) Fascia Exposed: No Fat Layer (Subcutaneous Tissue) Exposed: No Tendon Exposed: No Muscle Exposed: No Joint Exposed: No Bone Exposed: No Electronic Signature(s) Signed: 09/09/2021 4:29:02 PM By: Adline Peals Signed: 09/09/2021 4:29:02 PM By: Adline Peals Entered By: Adline Peals on 09/09/2021 11:52:26 -------------------------------------------------------------------------------- Wound Assessment Details Patient Name: Date of Service: [name]. 09/09/2021 10:45 A M Medical Record Number: [id number] Patient Account Number: [id number] Date of Birth/Sex: Treating RN: [birth year] (76 y.o. Harlow Ohms Primary Care Evelette Hollern: Bing Matter Other  Clinician: Referring Omega Durante: Treating Mohamedamin Nifong/Extender: Donia Ast in Treatment: 13 Wound Status Wound Number: 9 Primary Abrasion Etiology: Wound Location: Right, Lateral Knee Wound Open Wounding Event: Shear/Friction Status: Date Acquired: 08/18/2021 Comorbid Chronic sinus problems/congestion, Anemia, Chronic Obstructive Weeks Of Treatment: 3 History: Pulmonary Disease (COPD), Congestive Heart Failure, Clustered Wound: No Hypertension, Peripheral Venous Disease, Type II Diabetes, Dementia, Neuropathy Photos Wound Measurements Length: (cm) 1 Width: (cm) 0.3 Depth: (cm) 0.1 Area: (cm) 0.236 Volume: (cm) 0.024 % Reduction in Area: 70.5% % Reduction in Volume: 70% Epithelialization: Large (67-100%) Tunneling: No Undermining: No Wound Description Classification: Full Thickness Without Exposed Support Structures Wound Margin: Distinct, outline attached Exudate Amount: None Present Foul Odor After Cleansing: No Slough/Fibrino No Wound Bed Granulation Amount: Small (1-33%) Exposed Structure Granulation Quality: Red Fascia Exposed: No Necrotic Amount: Large (67-100%) Fat Layer (Subcutaneous Tissue) Exposed: Yes Necrotic Quality: Eschar Tendon Exposed: No Muscle Exposed: No Joint Exposed: No Bone Exposed: No Treatment Notes Wound #9 (Knee) Wound Laterality: Right, Lateral Cleanser Soap and Water Discharge Instruction: May shower and wash wound with dial antibacterial soap and water prior to dressing change. Peri-Wound Care Topical Primary Dressing KerraCel Ag Gelling Fiber Dressing, 2x2 in (silver alginate) Discharge Instruction: Apply silver alginate to wound bed as instructed Secondary Dressing Woven Gauze Sponge, Non-Sterile 4x4 in Discharge Instruction: Apply over primary dressing as directed. Secured With [employer] 4x5 (in/yd) Discharge Instruction: Secure with Coban as directed. Kerlix Roll Sterile, 4.5x3.1  (in/yd) Discharge Instruction: Secure with Kerlix as directed. Compression Wrap Compression Stockings Add-Ons Electronic Signature(s) Signed: 09/09/2021 4:29:02 PM By: Adline Peals Entered By: Adline Peals on 09/09/2021 11:40:20 -------------------------------------------------------------------------------- Vitals Details Patient Name: Date of Service: [name]. 09/09/2021 10:45 A M Medical Record Number: [id number] Patient Account Number: [id number] Date of Birth/Sex: Treating RN: [birth year] (76 y.o. Harlow Ohms Primary Care Vickey Ewbank: Bing Matter Other Clinician: Referring Arlena Marsan: Treating Jolaine Fryberger/Extender: Donia Ast in Treatment: 13 Vital Signs Time Taken: 11:20 Temperature (F): 98 Height (in): 67 Pulse (bpm): 102 Weight (lbs): 183 Respiratory Rate (breaths/min): 18 Body Mass Index (BMI): 28.7 Blood Pressure (mmHg):  113/69 Reference Range: 80 - 120 mg / dl Electronic Signature(s) Signed: 09/09/2021 4:29:02 PM By: Adline Peals Entered By: Adline Peals on 09/09/2021 11:20:51

## 2021-09-09 NOTE — Progress Notes (Signed)
[name], [name] ([id number]) Visit Report for 09/09/2021 Chief Complaint Document Details Patient Name: Date of Service: [name], [name] [name] 09/09/2021 10:45 A M Medical Record Number: [id number] Patient Account Number: [id number] Date of Birth/Sex: Treating RN: 10-Jul-1945 (76 y.o. [name] Primary Care Provider: Bing Matter Other Clinician: Referring Provider: Treating Provider/Extender: Donia Ast in Treatment: 13 Information Obtained from: Patient Chief Complaint 06/09/2021: Patient is here with multiple lower extremity ulcers on her shins and calves, as well as an open blister on her left heel, ulcers on her third and fourth toes and first metatarsal head on the left foot. She also has an area of reddened skin on her buttock, but this is not open. Electronic Signature(s) Signed: 09/09/2021 12:28:01 PM By: Fredirick Maudlin MD FACS Entered By: Fredirick Maudlin on 09/09/2021 12:28:01 -------------------------------------------------------------------------------- Debridement Details Patient Name: Date of Service: [name]. 09/09/2021 10:45 A M Medical Record Number: [id number] Patient Account Number: [id number] Date of Birth/Sex: Treating RN: 1945/09/15 (76 y.o. [name] Primary Care Provider: Bing Matter Other Clinician: Referring Provider: Treating Provider/Extender: Donia Ast in Treatment: 13 Debridement Performed for Assessment: Wound #10 Right,Medial T Great oe Performed By: Physician Fredirick Maudlin, MD Debridement Type: Debridement Severity of Tissue Pre Debridement: Fat layer exposed Level of Consciousness (Pre-procedure): Awake and Alert Pre-procedure Verification/Time Out Yes - 11:49 Taken: Start Time: 11:49 Pain Control: Other : benzocaine 2o % spray T Area Debrided (L x W): otal 0.5 (cm) x 0.5 (cm) = 0.25 (cm) Tissue and other material debrided: Viable, Non-Viable, Slough,  Subcutaneous, Slough Level: Skin/Subcutaneous Tissue Debridement Description: Excisional Instrument: Curette Bleeding: Minimum Hemostasis Achieved: Pressure Procedural Pain: 0 Post Procedural Pain: 0 Response to Treatment: Procedure was tolerated well Level of Consciousness (Post- Awake and Alert procedure): Post Debridement Measurements of Total Wound Length: (cm) 0.5 Width: (cm) 0.5 Depth: (cm) 0.1 Volume: (cm) 0.02 Character of Wound/Ulcer Post Debridement: Improved Severity of Tissue Post Debridement: Fat layer exposed Post Procedure Diagnosis Same as Pre-procedure Electronic Signature(s) Signed: 09/09/2021 12:34:21 PM By: Fredirick Maudlin MD FACS Signed: 09/09/2021 4:29:02 PM By: Adline Peals Entered By: Adline Peals on 09/09/2021 11:49:47 -------------------------------------------------------------------------------- Debridement Details Patient Name: Date of Service: [name]. 09/09/2021 10:45 A M Medical Record Number: [id number] Patient Account Number: [id number] Date of Birth/Sex: Treating RN: [birth year] (76 y.o. [name] Primary Care Provider: Bing Matter Other Clinician: Referring Provider: Treating Provider/Extender: Donia Ast in Treatment: 13 Debridement Performed for Assessment: Wound #13 Right,Lateral Foot Performed By: Physician Fredirick Maudlin, MD Debridement Type: Debridement Level of Consciousness (Pre-procedure): Awake and Alert Pre-procedure Verification/Time Out Yes - 11:49 Taken: Start Time: 11:49 Pain Control: Other : benzocaine 2o % spray T Area Debrided (L x W): otal 0.8 (cm) x 0.5 (cm) = 0.4 (cm) Tissue and other material debrided: Viable, Non-Viable, Slough, Subcutaneous, Slough Level: Skin/Subcutaneous Tissue Debridement Description: Excisional Instrument: Curette Bleeding: Minimum Hemostasis Achieved: Pressure Procedural Pain: 0 Post Procedural Pain: 0 Response to  Treatment: Procedure was tolerated well Level of Consciousness (Post- Awake and Alert procedure): Post Debridement Measurements of Total Wound Length: (cm) 0.8 Width: (cm) 0.5 Depth: (cm) 0.1 Volume: (cm) 0.031 Character of Wound/Ulcer Post Debridement: Improved Post Procedure Diagnosis Same as Pre-procedure Electronic Signature(s) Signed: 09/09/2021 12:34:21 PM By: Fredirick Maudlin MD FACS Signed: 09/09/2021 4:29:02 PM By: Adline Peals Entered By: Adline Peals on 09/09/2021 11:50:11 -------------------------------------------------------------------------------- Debridement Details Patient Name: Date of Service: [name], [name] 09/09/2021 10:45 A M  Medical Record Number: [id number] Patient Account Number: [id number] Date of Birth/Sex: Treating RN: 12-Dec-1945 (76 y.o. [name] Primary Care Provider: Other Clinician: Bing Matter Referring Provider: Treating Provider/Extender: Donia Ast in Treatment: 13 Debridement Performed for Assessment: Wound #9 Right,Lateral Knee Performed By: Physician Fredirick Maudlin, MD Debridement Type: Debridement Level of Consciousness (Pre-procedure): Awake and Alert Pre-procedure Verification/Time Out Yes - 11:49 Taken: Start Time: 11:49 Pain Control: Other : benzocaine 2o % spray T Area Debrided (L x W): otal 1 (cm) x 0.3 (cm) = 0.3 (cm) Tissue and other material debrided: Viable, Non-Viable, Eschar Level: Non-Viable Tissue Debridement Description: Selective/Open Wound Instrument: Curette Bleeding: Minimum Hemostasis Achieved: Pressure Procedural Pain: 0 Post Procedural Pain: 0 Response to Treatment: Procedure was tolerated well Level of Consciousness (Post- Awake and Alert procedure): Post Debridement Measurements of Total Wound Length: (cm) 1 Width: (cm) 0.3 Depth: (cm) 0.1 Volume: (cm) 0.024 Character of Wound/Ulcer Post Debridement: Improved Post Procedure  Diagnosis Same as Pre-procedure Electronic Signature(s) Signed: 09/09/2021 12:34:21 PM By: Fredirick Maudlin MD FACS Signed: 09/09/2021 4:29:02 PM By: Adline Peals Entered By: Adline Peals on 09/09/2021 11:50:50 -------------------------------------------------------------------------------- Debridement Details Patient Name: Date of Service: [name]. 09/09/2021 10:45 A M Medical Record Number: [id number] Patient Account Number: [id number] Date of Birth/Sex: Treating RN: [birth year] (76 y.o. [name] Primary Care Provider: Bing Matter Other Clinician: Referring Provider: Treating Provider/Extender: Donia Ast in Treatment: 13 Debridement Performed for Assessment: Wound #12 Sacrum Performed By: Physician Fredirick Maudlin, MD Debridement Type: Debridement Level of Consciousness (Pre-procedure): Awake and Alert Pre-procedure Verification/Time Out Yes - 11:49 Taken: Start Time: 11:49 Pain Control: Other : benzocaine 2o % spray T Area Debrided (L x W): otal 1.4 (cm) x 0.9 (cm) = 1.26 (cm) Tissue and other material debrided: Viable, Non-Viable, Slough, Subcutaneous, Slough Level: Skin/Subcutaneous Tissue Debridement Description: Excisional Instrument: Curette Bleeding: Minimum Hemostasis Achieved: Pressure Procedural Pain: 0 Post Procedural Pain: 0 Response to Treatment: Procedure was tolerated well Level of Consciousness (Post- Awake and Alert procedure): Post Debridement Measurements of Total Wound Length: (cm) 1.4 Stage: Category/Stage II Width: (cm) 0.9 Depth: (cm) 0.1 Volume: (cm) 0.099 Character of Wound/Ulcer Post Debridement: Improved Post Procedure Diagnosis Same as Pre-procedure Electronic Signature(s) Signed: 09/09/2021 12:34:21 PM By: Fredirick Maudlin MD FACS Signed: 09/09/2021 4:29:02 PM By: Adline Peals Entered By: Adline Peals on 09/09/2021  11:51:51 -------------------------------------------------------------------------------- HPI Details Patient Name: Date of Service: Christina Angst L. 09/09/2021 10:45 A M Medical Record Number: [id number] Patient Account Number: [id number] Date of Birth/Sex: Treating RN: 26-Sep-1945 (76 y.o. [name] Primary Care Provider: Bing Matter Other Clinician: Referring Provider: Treating Provider/Extender: Donia Ast in Treatment: 13 History of Present Illness HPI Description: ADMISSION 06/09/2021: This is a 76 year old resident of [place]. She was recently hospitalized with sepsis. She had an iliopsoas abscess that was drained. While in the hospital, multiple wounds were identified. There were scattered superficial partial-thickness ulcerations on both lower extremities from the ankle to the knee. There was concern for a possible sacral pressure ulcer and left posterior thigh pressure ulcer. She also had 3 diabetic foot ulcers on the left foot. These involve the DIP joint of the third toe, the DIP joint of the fourth toe, and the plantar surface of the first metatarsal head. Both toe wounds have exposed bone. They have been applying Santyl at her skilled nursing facility. She is here for further evaluation and management. Her past medical history is notable for history  of type 2 diabetes, CVA, stage II CKD, CHF, peripheral vascular disease, history of DVT chronic atrial fibrillation on Xarelto, rheumatoid arthritis. , There has been no imaging performed of her lower extremities. ABIs in clinic today were noncompressible. Hemoglobin A1c on admission to the hospital was acceptable at 5.7%. 08/18/2021: I have not seen the patient since her first visit as she has been in and out of the hospital with multiple issues including multiple episodes of sepsis and A-fib with RVR as well as altered mental status. During that time, the majority of the wounds that she  had at her first visit have healed. She has a tiny wound on her medial right great toe that remains from her previous visits. She has what looks like an abrasion on her right knee as well as similar appearing wounds on her right lateral ankle. She says that she fell and landed on her sacrum earlier today and is in a fair amount of pain because of that. 08/25/2021: She has a new stage II sacral pressure ulcer. It looks like they have been applying Iodosorb at her facility as the area is stained yellow. Many of her small lower extremity wounds have closed. Her right great toenail looks like it is about to fall off. The wound on her right medial great toe is small with a bit of eschar covering it. The wound on her right lateral knee and left anterior tibia have a similar appearance. The other small wounds are no longer open. 09/02/2021: There is a little bit of slough accumulation on the sacral pressure ulcer. There is scabbing/eschar on her right great toe, right knee, and right lateral ankle. She also has a blood blister on her left first metatarsal head that looks like there may be open tissue underneath this. 09/09/2021: The left first metatarsal head site is closed. All of her other wounds are little bit smaller except for the sacrum, which is stable. There is eschar and slough accumulation at each site. Electronic Signature(s) Signed: 09/09/2021 12:28:39 PM By: Fredirick Maudlin MD FACS Entered By: Fredirick Maudlin on 09/09/2021 12:28:39 -------------------------------------------------------------------------------- Physical Exam Details Patient Name: Date of Service: [name]. 09/09/2021 10:45 A M Medical Record Number: [id number] Patient Account Number: [id number] Date of Birth/Sex: Treating RN: [birth year] (76 y.o. [name] Primary Care Provider: Bing Matter Other Clinician: Referring Provider: Treating Provider/Extender: Donia Ast in Treatment:  13 Constitutional . Slightly tachycardic, asymptomatic. . . No acute distress.[name] Respiratory Normal work of breathing on room air.. Notes 09/09/2021: The left first metatarsal head site is closed. All of her other wounds are little bit smaller except for the sacrum, which is stable. There is eschar and slough accumulation at each site. Electronic Signature(s) Signed: 09/09/2021 12:29:36 PM By: Fredirick Maudlin MD FACS Entered By: Fredirick Maudlin on 09/09/2021 12:29:36 -------------------------------------------------------------------------------- Physician Orders Details Patient Name: Date of Service: [name]. 09/09/2021 10:45 A M Medical Record Number: [id number] Patient Account Number: [id number] Date of Birth/Sex: Treating RN: 06-06-1945 (76 y.o. [name] Primary Care Provider: Bing Matter Other Clinician: Referring Provider: Treating Provider/Extender: Donia Ast in Treatment: 13 Verbal / Phone Orders: No Diagnosis Coding ICD-10 Coding Code Description E11.621 Type 2 diabetes mellitus with foot ulcer E11.622 Type 2 diabetes mellitus with other skin ulcer L89.152 Pressure ulcer of sacral region, stage 2 L97.526 Non-pressure chronic ulcer of other part of left foot with bone involvement without evidence of necrosis I73.9 Peripheral vascular disease, unspecified I87.311  Chronic venous hypertension (idiopathic) with ulcer of right lower extremity I87.312 Chronic venous hypertension (idiopathic) with ulcer of left lower extremity I10 Essential (primary) hypertension A35.57 Chronic diastolic (congestive) heart failure I48.20 Chronic atrial fibrillation, unspecified I63.9 Cerebral infarction, unspecified Follow-up Appointments ppointment in 1 week. - Dr. Celine Ahr room 2 - 6/19 at 3:00 pm Return A Bathing/ Shower/ Hygiene May shower with protection but do not get wound dressing(s) wet. - may purchase cast protector from [facility],  [facility], [facility] Edema Control - Lymphedema / SCD / Other Elevate legs to the level of the heart or above for 30 minutes daily and/or when sitting, a frequency of: - throughout the day Moisturize legs daily. Off-Loading Other: - cushion heels with pillow or heel cups, offload and stay off bottom Additional Orders / Instructions Wound #10 Right,Medial T Great oe Other: - *****PLEASE SEND TO HER HEART DOCTOR FOR HER ELEVATED HEART RATE*********** Wound #12 Sacrum Other: - *****PLEASE SEND TO HER HEART DOCTOR FOR HER ELEVATED HEART RATE*********** Wound #13 Right,Lateral Foot Other: - *****PLEASE SEND TO HER HEART DOCTOR FOR HER ELEVATED HEART RATE*********** Wound #9 Right,Lateral Knee Other: - *****PLEASE SEND TO HER HEART DOCTOR FOR HER ELEVATED HEART RATE*********** Wound Treatment Wound #10 - T Great oe Wound Laterality: Right, Medial Cleanser: Soap and Water Every Other Day/7 Days Discharge Instructions: May shower and wash wound with dial antibacterial soap and water prior to dressing change. Cleanser: Wound Cleanser Every Other Day/7 Days Discharge Instructions: Cleanse the wound with wound cleanser prior to applying a clean dressing using gauze sponges, not tissue or cotton balls. Peri-Wound Care: Sween Lotion (Moisturizing lotion) Every Other Day/7 Days Discharge Instructions: Apply moisturizing lotion as directed Prim Dressing: KerraCel Ag Gelling Fiber Dressing, 2x2 in (silver alginate) Every Other Day/7 Days ary Discharge Instructions: Apply silver alginate to wound bed as instructed Secondary Dressing: Woven Gauze Sponge, Non-Sterile 4x4 in Every Other Day/7 Days Discharge Instructions: Apply over primary dressing as directed. Secured With: Child psychotherapist, Sterile 2x75 (in/in) Every Other Day/7 Days Discharge Instructions: Secure with stretch gauze as directed. Secured With: 61M Medipore H Soft Cloth Surgical T ape, 4 x 10 (in/yd) Every Other Day/7  Days Discharge Instructions: Secure with tape as directed. Wound #12 - Sacrum Cleanser: Soap and Water 1 x Per Day/30 Days Discharge Instructions: May shower and wash wound with dial antibacterial soap and water prior to dressing change. Cleanser: Wound Cleanser 1 x Per Day/30 Days Discharge Instructions: Cleanse the wound with wound cleanser prior to applying a clean dressing using gauze sponges, not tissue or cotton balls. Peri-Wound Care: Zinc Oxide Ointment 30g tube 1 x Per Day/30 Days Discharge Instructions: Apply Zinc Oxide to periwound with each dressing change Prim Dressing: Iodosorb Gel 10 (gm) Tube 1 x Per Day/30 Days ary Discharge Instructions: Apply to wound bed as instructed Secondary Dressing: Bordered Gauze, 4x4 in 1 x Per Day/30 Days Discharge Instructions: Apply over primary dressing as directed. Secondary Dressing: Woven Gauze Sponge, Non-Sterile 4x4 in 1 x Per Day/30 Days Discharge Instructions: Apply over primary dressing as directed. Wound #13 - Foot Wound Laterality: Right, Lateral Cleanser: Soap and Water Every Other Day/7 Days Discharge Instructions: May shower and wash wound with dial antibacterial soap and water prior to dressing change. Cleanser: Wound Cleanser Every Other Day/7 Days Discharge Instructions: Cleanse the wound with wound cleanser prior to applying a clean dressing using gauze sponges, not tissue or cotton balls. Peri-Wound Care: Sween Lotion (Moisturizing lotion) Every Other Day/7 Days Discharge Instructions: Apply  moisturizing lotion as directed Prim Dressing: KerraCel Ag Gelling Fiber Dressing, 2x2 in (silver alginate) Every Other Day/7 Days ary Discharge Instructions: Apply silver alginate to wound bed as instructed Secondary Dressing: Woven Gauze Sponge, Non-Sterile 4x4 in Every Other Day/7 Days Discharge Instructions: Apply over primary dressing as directed. Compression Wrap: Kerlix Roll 4.5x3.1 (in/yd) Every Other Day/7 Days Discharge  Instructions: Apply Kerlix and Coban compression as directed. Compression Wrap: Coban Self-Adherent Wrap 4x5 (in/yd) Every Other Day/7 Days Discharge Instructions: Apply over Kerlix as directed. Wound #9 - Knee Wound Laterality: Right, Lateral Cleanser: Soap and Water Every Other Day/30 Days Discharge Instructions: May shower and wash wound with dial antibacterial soap and water prior to dressing change. Prim Dressing: KerraCel Ag Gelling Fiber Dressing, 2x2 in (silver alginate) Every Other Day/30 Days ary Discharge Instructions: Apply silver alginate to wound bed as instructed Secondary Dressing: Woven Gauze Sponge, Non-Sterile 4x4 in Every Other Day/30 Days Discharge Instructions: Apply over primary dressing as directed. Secured With: Coban Self-Adherent Wrap 4x5 (in/yd) Every Other Day/30 Days Discharge Instructions: Secure with Coban as directed. Secured With: [employer], 4.5x3.1 (in/yd) Every Other Day/30 Days Discharge Instructions: Secure with Kerlix as directed. Electronic Signature(s) Signed: 09/09/2021 12:34:21 PM By: Fredirick Maudlin MD FACS Entered By: Fredirick Maudlin on 09/09/2021 12:31:07 -------------------------------------------------------------------------------- Problem List Details Patient Name: Date of Service: [name]. 09/09/2021 10:45 A M Medical Record Number: [id number] Patient Account Number: [id number] Date of Birth/Sex: Treating RN: 1945-05-27 (76 y.o. [name] Primary Care Provider: Bing Matter Other Clinician: Referring Provider: Treating Provider/Extender: Donia Ast in Treatment: 13 Active Problems ICD-10 Encounter Code Description Active Date MDM Diagnosis E11.621 Type 2 diabetes mellitus with foot ulcer 06/09/2021 No Yes E11.622 Type 2 diabetes mellitus with other skin ulcer 06/09/2021 No Yes L89.152 Pressure ulcer of sacral region, stage 2 08/25/2021 No Yes L97.526 Non-pressure chronic  ulcer of other part of left foot with bone involvement 06/09/2021 No Yes without evidence of necrosis I73.9 Peripheral vascular disease, unspecified 06/09/2021 No Yes I87.311 Chronic venous hypertension (idiopathic) with ulcer of right lower extremity 06/09/2021 No Yes I87.312 Chronic venous hypertension (idiopathic) with ulcer of left lower extremity 06/09/2021 No Yes I10 Essential (primary) hypertension 06/09/2021 No Yes E36.62 Chronic diastolic (congestive) heart failure 06/09/2021 No Yes I48.20 Chronic atrial fibrillation, unspecified 06/09/2021 No Yes I63.9 Cerebral infarction, unspecified 06/09/2021 No Yes Inactive Problems Resolved Problems Electronic Signature(s) Signed: 09/09/2021 12:26:29 PM By: Fredirick Maudlin MD FACS Entered By: Fredirick Maudlin on 09/09/2021 12:26:29 -------------------------------------------------------------------------------- Progress Note Details Patient Name: Date of Service: [name]. 09/09/2021 10:45 A M Medical Record Number: [id number] Patient Account Number: [id number] Date of Birth/Sex: Treating RN: April 21, 1945 (76 y.o. [name] Primary Care Provider: Bing Matter Other Clinician: Referring Provider: Treating Provider/Extender: Donia Ast in Treatment: 13 Subjective Chief Complaint Information obtained from Patient 06/09/2021: Patient is here with multiple lower extremity ulcers on her shins and calves, as well as an open blister on her left heel, ulcers on her third and fourth toes and first metatarsal head on the left foot. She also has an area of reddened skin on her buttock, but this is not open. History of Present Illness (HPI) ADMISSION 06/09/2021: This is a 76 year old resident of [place]. She was recently hospitalized with sepsis. She had an iliopsoas abscess that was drained. While in the hospital, multiple wounds were identified. There were scattered superficial partial-thickness ulcerations on both  lower extremities from the ankle to the  knee. There was concern for a possible sacral pressure ulcer and left posterior thigh pressure ulcer. She also had 3 diabetic foot ulcers on the left foot. These involve the DIP joint of the third toe, the DIP joint of the fourth toe, and the plantar surface of the first metatarsal head. Both toe wounds have exposed bone. They have been applying Santyl at her skilled nursing facility. She is here for further evaluation and management. Her past medical history is notable for history of type 2 diabetes, CVA, stage II CKD, CHF, peripheral vascular disease, history of DVT chronic atrial fibrillation on Xarelto, rheumatoid arthritis. , There has been no imaging performed of her lower extremities. ABIs in clinic today were noncompressible. Hemoglobin A1c on admission to the hospital was acceptable at 5.7%. 08/18/2021: I have not seen the patient since her first visit as she has been in and out of the hospital with multiple issues including multiple episodes of sepsis and A-fib with RVR as well as altered mental status. During that time, the majority of the wounds that she had at her first visit have healed. She has a tiny wound on her medial right great toe that remains from her previous visits. She has what looks like an abrasion on her right knee as well as similar appearing wounds on her right lateral ankle. She says that she fell and landed on her sacrum earlier today and is in a fair amount of pain because of that. 08/25/2021: She has a new stage II sacral pressure ulcer. It looks like they have been applying Iodosorb at her facility as the area is stained yellow. Many of her small lower extremity wounds have closed. Her right great toenail looks like it is about to fall off. The wound on her right medial great toe is small with a bit of eschar covering it. The wound on her right lateral knee and left anterior tibia have a similar appearance. The other small wounds  are no longer open. 09/02/2021: There is a little bit of slough accumulation on the sacral pressure ulcer. There is scabbing/eschar on her right great toe, right knee, and right lateral ankle. She also has a blood blister on her left first metatarsal head that looks like there may be open tissue underneath this. 09/09/2021: The left first metatarsal head site is closed. All of her other wounds are little bit smaller except for the sacrum, which is stable. There is eschar and slough accumulation at each site. Patient History Unable to Obtain Patient History due to Dementia. Information obtained from Chart. Social History Never smoker, Marital Status - Divorced, Alcohol Use - Never, Drug Use - No History, Caffeine Use - Daily. Medical History Ear/Nose/Mouth/Throat Patient has history of Chronic sinus problems/congestion Hematologic/Lymphatic Patient has history of Anemia - due to CKD Respiratory Patient has history of Chronic Obstructive Pulmonary Disease (COPD) Cardiovascular Patient has history of Congestive Heart Failure, Hypertension, Peripheral Venous Disease Endocrine Patient has history of Type II Diabetes Genitourinary Denies history of End Stage Renal Disease Neurologic Patient has history of Dementia - Alzheimers, Neuropathy Medical A Surgical History Notes nd Eyes diabetic retinopathy Cardiovascular atherosclerotic heart disease A fib hyperlipedemia Gastrointestinal GERD Genitourinary CKD stage II Neurologic persistent mood disorder, affective vascular dementia Objective Constitutional Slightly tachycardic, asymptomatic. No acute distress.. Vitals Time Taken: 11:20 AM, Height: 67 in, Weight: 183 lbs, BMI: 28.7, Temperature: 98 F, Pulse: 102 bpm, Respiratory Rate: 18 breaths/min, Blood Pressure: 113/69 mmHg. Respiratory Normal work of breathing on room air.. General  Notes: 09/09/2021: The left first metatarsal head site is closed. All of her other wounds are little bit  smaller except for the sacrum, which is stable. There is eschar and slough accumulation at each site. Integumentary (Hair, Skin) Wound #10 status is Open. Original cause of wound was Gradually Appeared. The date acquired was: 08/18/2021. The wound has been in treatment 3 weeks. The wound is located on the Right,Medial T Great. The wound measures 0.5cm length x 0.5cm width x 0.1cm depth; 0.196cm^2 area and 0.02cm^3 volume. oe There is Fat Layer (Subcutaneous Tissue) exposed. There is no tunneling or undermining noted. There is a medium amount of serosanguineous drainage noted. The wound margin is distinct with the outline attached to the wound base. There is small (1-33%) red granulation within the wound bed. There is a large (67-100%) amount of necrotic tissue within the wound bed including Adherent Slough. Wound #12 status is Open. Original cause of wound was Pressure Injury. The date acquired was: 08/15/2021. The wound has been in treatment 2 weeks. The wound is located on the Sacrum. The wound measures 1.4cm length x 0.9cm width x 0.1cm depth; 0.99cm^2 area and 0.099cm^3 volume. There is Fat Layer (Subcutaneous Tissue) exposed. There is no tunneling or undermining noted. There is a medium amount of serosanguineous drainage noted. The wound margin is distinct with the outline attached to the wound base. There is small (1-33%) red granulation within the wound bed. There is a large (67-100%) amount of necrotic tissue within the wound bed including Adherent Slough. Wound #13 status is Open. Original cause of wound was Trauma. The date acquired was: 09/02/2021. The wound has been in treatment 1 weeks. The wound is located on the Right,Lateral Foot. The wound measures 0.8cm length x 0.5cm width x 0.1cm depth; 0.314cm^2 area and 0.031cm^3 volume. There is Fat Layer (Subcutaneous Tissue) exposed. There is no tunneling or undermining noted. There is a medium amount of serosanguineous drainage noted. The wound  margin is distinct with the outline attached to the wound base. There is small (1-33%) red granulation within the wound bed. There is a large (67-100%) amount of necrotic tissue within the wound bed including Adherent Slough. Wound #14 status is Open. Original cause of wound was Trauma. The date acquired was: 09/02/2021. The wound has been in treatment 1 weeks. The wound is located on the Left Metatarsal head first. The wound measures 0cm length x 0cm width x 0cm depth; 0cm^2 area and 0cm^3 volume. There is no tunneling or undermining noted. There is a none present amount of drainage noted. The wound margin is distinct with the outline attached to the wound base. There is no granulation within the wound bed. There is no necrotic tissue within the wound bed. Wound #9 status is Open. Original cause of wound was Shear/Friction. The date acquired was: 08/18/2021. The wound has been in treatment 3 weeks. The wound is located on the Right,Lateral Knee. The wound measures 1cm length x 0.3cm width x 0.1cm depth; 0.236cm^2 area and 0.024cm^3 volume. There is Fat Layer (Subcutaneous Tissue) exposed. There is no tunneling or undermining noted. There is a none present amount of drainage noted. The wound margin is distinct with the outline attached to the wound base. There is small (1-33%) red granulation within the wound bed. There is a large (67-100%) amount of necrotic tissue within the wound bed including Eschar. Assessment Active Problems ICD-10 Type 2 diabetes mellitus with foot ulcer Type 2 diabetes mellitus with other skin ulcer Pressure ulcer of  sacral region, stage 2 Non-pressure chronic ulcer of other part of left foot with bone involvement without evidence of necrosis Peripheral vascular disease, unspecified Chronic venous hypertension (idiopathic) with ulcer of right lower extremity Chronic venous hypertension (idiopathic) with ulcer of left lower extremity Essential (primary)  hypertension Chronic diastolic (congestive) heart failure Chronic atrial fibrillation, unspecified Cerebral infarction, unspecified Procedures Wound #10 Pre-procedure diagnosis of Wound #10 is a Diabetic Wound/Ulcer of the Lower Extremity located on the Right,Medial T Great .Severity of Tissue Pre oe Debridement is: Fat layer exposed. There was a Excisional Skin/Subcutaneous Tissue Debridement with a total area of 0.25 sq cm performed by Fredirick Maudlin, MD. With the following instrument(s): Curette to remove Viable and Non-Viable tissue/material. Material removed includes Subcutaneous Tissue and Slough and after achieving pain control using Other (benzocaine 2o % spray). No specimens were taken. A time out was conducted at 11:49, prior to the start of the procedure. A Minimum amount of bleeding was controlled with Pressure. The procedure was tolerated well with a pain level of 0 throughout and a pain level of 0 following the procedure. Post Debridement Measurements: 0.5cm length x 0.5cm width x 0.1cm depth; 0.02cm^3 volume. Character of Wound/Ulcer Post Debridement is improved. Severity of Tissue Post Debridement is: Fat layer exposed. Post procedure Diagnosis Wound #10: Same as Pre-Procedure Wound #12 Pre-procedure diagnosis of Wound #12 is a Pressure Ulcer located on the Sacrum . There was a Excisional Skin/Subcutaneous Tissue Debridement with a total area of 1.26 sq cm performed by Fredirick Maudlin, MD. With the following instrument(s): Curette to remove Viable and Non-Viable tissue/material. Material removed includes Subcutaneous Tissue and Slough and after achieving pain control using Other (benzocaine 2o % spray). No specimens were taken. A time out was conducted at 11:49, prior to the start of the procedure. A Minimum amount of bleeding was controlled with Pressure. The procedure was tolerated well with a pain level of 0 throughout and a pain level of 0 following the procedure. Post  Debridement Measurements: 1.4cm length x 0.9cm width x 0.1cm depth; 0.099cm^3 volume. Post debridement Stage noted as Category/Stage II. Character of Wound/Ulcer Post Debridement is improved. Post procedure Diagnosis Wound #12: Same as Pre-Procedure Wound #13 Pre-procedure diagnosis of Wound #13 is an Abrasion located on the Right,Lateral Foot . There was a Excisional Skin/Subcutaneous Tissue Debridement with a total area of 0.4 sq cm performed by Fredirick Maudlin, MD. With the following instrument(s): Curette to remove Viable and Non-Viable tissue/material. Material removed includes Subcutaneous Tissue and Slough and after achieving pain control using Other (benzocaine 2o % spray). No specimens were taken. A time out was conducted at 11:49, prior to the start of the procedure. A Minimum amount of bleeding was controlled with Pressure. The procedure was tolerated well with a pain level of 0 throughout and a pain level of 0 following the procedure. Post Debridement Measurements: 0.8cm length x 0.5cm width x 0.1cm depth; 0.031cm^3 volume. Character of Wound/Ulcer Post Debridement is improved. Post procedure Diagnosis Wound #13: Same as Pre-Procedure Wound #9 Pre-procedure diagnosis of Wound #9 is an Abrasion located on the Right,Lateral Knee . There was a Selective/Open Wound Non-Viable Tissue Debridement with a total area of 0.3 sq cm performed by Fredirick Maudlin, MD. With the following instrument(s): Curette to remove Viable and Non-Viable tissue/material. Material removed includes Eschar after achieving pain control using Other (benzocaine 2o % spray). No specimens were taken. A time out was conducted at 11:49, prior to the start of the procedure. A Minimum amount of  bleeding was controlled with Pressure. The procedure was tolerated well with a pain level of 0 throughout and a pain level of 0 following the procedure. Post Debridement Measurements: 1cm length x 0.3cm width x 0.1cm depth; 0.024cm^3  volume. Character of Wound/Ulcer Post Debridement is improved. Post procedure Diagnosis Wound #9: Same as Pre-Procedure Plan Follow-up Appointments: Return Appointment in 1 week. - Dr. Celine Ahr room 2 - 6/19 at 3:00 pm Bathing/ Shower/ Hygiene: May shower with protection but do not get wound dressing(s) wet. - may purchase cast protector from [facility], [facility], [employer] Edema Control - Lymphedema / SCD / Other: Elevate legs to the level of the heart or above for 30 minutes daily and/or when sitting, a frequency of: - throughout the day Moisturize legs daily. Off-Loading: Other: - cushion heels with pillow or heel cups, offload and stay off bottom Additional Orders / Instructions: Wound #10 Right,Medial T Great: oe Other: - *****PLEASE SEND TO HER HEART DOCTOR FOR HER ELEVATED HEART RATE*********** Wound #12 Sacrum: Other: - *****PLEASE SEND TO HER HEART DOCTOR FOR HER ELEVATED HEART RATE*********** Wound #13 Right,Lateral Foot: Other: - *****PLEASE SEND TO HER HEART DOCTOR FOR HER ELEVATED HEART RATE*********** Wound #9 Right,Lateral Knee: Other: - *****PLEASE SEND TO HER HEART DOCTOR FOR HER ELEVATED HEART RATE*********** WOUND #10: - T Great Wound Laterality: Right, Medial oe Cleanser: Soap and Water Every Other Day/7 Days Discharge Instructions: May shower and wash wound with dial antibacterial soap and water prior to dressing change. Cleanser: Wound Cleanser Every Other Day/7 Days Discharge Instructions: Cleanse the wound with wound cleanser prior to applying a clean dressing using gauze sponges, not tissue or cotton balls. Peri-Wound Care: Sween Lotion (Moisturizing lotion) Every Other Day/7 Days Discharge Instructions: Apply moisturizing lotion as directed Prim Dressing: KerraCel Ag Gelling Fiber Dressing, 2x2 in (silver alginate) Every Other Day/7 Days ary Discharge Instructions: Apply silver alginate to wound bed as instructed Secondary Dressing: Woven Gauze Sponge, Non-Sterile 4x4  in Every Other Day/7 Days Discharge Instructions: Apply over primary dressing as directed. Secured With: Child psychotherapist, Sterile 2x75 (in/in) Every Other Day/7 Days Discharge Instructions: Secure with stretch gauze as directed. Secured With: 65M Medipore H Soft Cloth Surgical T ape, 4 x 10 (in/yd) Every Other Day/7 Days Discharge Instructions: Secure with tape as directed. WOUND #12: - Sacrum Wound Laterality: Cleanser: Soap and Water 1 x Per Day/30 Days Discharge Instructions: May shower and wash wound with dial antibacterial soap and water prior to dressing change. Cleanser: Wound Cleanser 1 x Per Day/30 Days Discharge Instructions: Cleanse the wound with wound cleanser prior to applying a clean dressing using gauze sponges, not tissue or cotton balls. Peri-Wound Care: Zinc Oxide Ointment 30g tube 1 x Per Day/30 Days Discharge Instructions: Apply Zinc Oxide to periwound with each dressing change Prim Dressing: Iodosorb Gel 10 (gm) Tube 1 x Per Day/30 Days ary Discharge Instructions: Apply to wound bed as instructed Secondary Dressing: Bordered Gauze, 4x4 in 1 x Per Day/30 Days Discharge Instructions: Apply over primary dressing as directed. Secondary Dressing: Woven Gauze Sponge, Non-Sterile 4x4 in 1 x Per Day/30 Days Discharge Instructions: Apply over primary dressing as directed. WOUND #13: - Foot Wound Laterality: Right, Lateral Cleanser: Soap and Water Every Other Day/7 Days Discharge Instructions: May shower and wash wound with dial antibacterial soap and water prior to dressing change. Cleanser: Wound Cleanser Every Other Day/7 Days Discharge Instructions: Cleanse the wound with wound cleanser prior to applying a clean dressing using gauze sponges, not tissue or cotton  balls. Peri-Wound Care: Sween Lotion (Moisturizing lotion) Every Other Day/7 Days Discharge Instructions: Apply moisturizing lotion as directed Prim Dressing: KerraCel Ag Gelling Fiber Dressing, 2x2  in (silver alginate) Every Other Day/7 Days ary Discharge Instructions: Apply silver alginate to wound bed as instructed Secondary Dressing: Woven Gauze Sponge, Non-Sterile 4x4 in Every Other Day/7 Days Discharge Instructions: Apply over primary dressing as directed. Com pression Wrap: Kerlix Roll 4.5x3.1 (in/yd) Every Other Day/7 Days Discharge Instructions: Apply Kerlix and Coban compression as directed. Com pression Wrap: Coban Self-Adherent Wrap 4x5 (in/yd) Every Other Day/7 Days Discharge Instructions: Apply over Kerlix as directed. WOUND #9: - Knee Wound Laterality: Right, Lateral Cleanser: Soap and Water Every Other Day/30 Days Discharge Instructions: May shower and wash wound with dial antibacterial soap and water prior to dressing change. Prim Dressing: KerraCel Ag Gelling Fiber Dressing, 2x2 in (silver alginate) Every Other Day/30 Days ary Discharge Instructions: Apply silver alginate to wound bed as instructed Secondary Dressing: Woven Gauze Sponge, Non-Sterile 4x4 in Every Other Day/30 Days Discharge Instructions: Apply over primary dressing as directed. Secured With: Coban Self-Adherent Wrap 4x5 (in/yd) Every Other Day/30 Days Discharge Instructions: Secure with Coban as directed. Secured With: [employer], 4.5x3.1 (in/yd) Every Other Day/30 Days Discharge Instructions: Secure with Kerlix as directed. 09/09/2021: The left first metatarsal head site is closed. All of her other wounds are little bit smaller except for the sacrum, which is stable. There is eschar and slough accumulation at each site. Used a curette to debride slough and eschar from each of her wound sites. We will continue using silver alginate with Kerlix and Coban on her leg and Iodosorb on her pressure ulcer. Follow-up in 1 week. Electronic Signature(s) Signed: 09/09/2021 12:32:00 PM By: Fredirick Maudlin MD FACS Entered By: Fredirick Maudlin on 09/09/2021  12:32:00 -------------------------------------------------------------------------------- HxROS Details Patient Name: Date of Service: [name], [name] 09/09/2021 10:45 A M Medical Record Number: [id number] Patient Account Number: [id number] Date of Birth/Sex: Treating RN: Apr 09, 1945 (76 y.o. [name] Primary Care Provider: Bing Matter Other Clinician: Referring Provider: Treating Provider/Extender: Donia Ast in Treatment: 13 Unable to Obtain Patient History due to Dementia Information Obtained From Chart Eyes Medical History: Past Medical History Notes: diabetic retinopathy Ear/Nose/Mouth/Throat Medical History: Positive for: Chronic sinus problems/congestion Hematologic/Lymphatic Medical History: Positive for: Anemia - due to CKD Respiratory Medical History: Positive for: Chronic Obstructive Pulmonary Disease (COPD) Cardiovascular Medical History: Positive for: Congestive Heart Failure; Hypertension; Peripheral Venous Disease Past Medical History Notes: atherosclerotic heart disease A fib hyperlipedemia Gastrointestinal Medical History: Past Medical History Notes: GERD Endocrine Medical History: Positive for: Type II Diabetes Treated with: Insulin, Oral agents Blood sugar tested every day: Yes Tested : Genitourinary Medical History: Negative for: End Stage Renal Disease Past Medical History Notes: CKD stage II Neurologic Medical History: Positive for: Dementia - Alzheimers; Neuropathy Past Medical History Notes: persistent mood disorder, affective vascular dementia HBO Extended History Items Ear/Nose/Mouth/Throat: Chronic sinus problems/congestion Immunizations Pneumococcal Vaccine: Received Pneumococcal Vaccination: Yes Received Pneumococcal Vaccination On or After 60th Birthday: Yes Implantable Devices None Family and Social History Never smoker; Marital Status - Divorced; Alcohol Use: Never; Drug Use:  No History; Caffeine Use: Daily; Financial Concerns: No; Food, Clothing or Shelter Needs: No; Support System Lacking: No; Transportation Concerns: No Electronic Signature(s) Signed: 09/09/2021 12:34:21 PM By: Fredirick Maudlin MD FACS Signed: 09/09/2021 4:29:02 PM By: Adline Peals Entered By: Fredirick Maudlin on 09/09/2021 12:28:56 -------------------------------------------------------------------------------- [place] Details Patient Name: Date of Service: [name], [place] L. 09/09/2021  Medical Record Number: [id number] Patient Account Number: [id number] Date of Birth/Sex: Treating RN: February 24, 1946 (76 y.o. [name] Primary Care Provider: Bing Matter Other Clinician: Referring Provider: Treating Provider/Extender: Donia Ast in Treatment: 13 Diagnosis Coding ICD-10 Codes Code Description E11.621 Type 2 diabetes mellitus with foot ulcer E11.622 Type 2 diabetes mellitus with other skin ulcer L89.152 Pressure ulcer of sacral region, stage 2 L97.526 Non-pressure chronic ulcer of other part of left foot with bone involvement without evidence of necrosis I73.9 Peripheral vascular disease, unspecified I87.311 Chronic venous hypertension (idiopathic) with ulcer of right lower extremity I87.312 Chronic venous hypertension (idiopathic) with ulcer of left lower extremity I10 Essential (primary) hypertension J94.17 Chronic diastolic (congestive) heart failure I48.20 Chronic atrial fibrillation, unspecified I63.9 Cerebral infarction, unspecified Facility Procedures CPT4 Code: [id number] Description: 85631 - DEB SUBQ TISSUE 20 SQ CM/< ICD-10 Diagnosis Description L97.526 Non-pressure chronic ulcer of other part of left foot with bone involvement witho Modifier: ut evidence of necr Quantity: 1 osis CPT4 Code: [id number] Description: 85885 - DEBRIDE WOUND 1ST 20 SQ CM OR < ICD-10 Diagnosis Description L89.152 Pressure ulcer of sacral region, stage  2 Modifier: Quantity: 1 Physician Procedures : CPT4 Code Description Modifier [id number] 87867 - WC PHYS LEVEL 3 - EST PT 25 ICD-10 Diagnosis Description L97.526 Non-pressure chronic ulcer of other part of left foot with bone involvement without evidence of necro L89.152 Pressure ulcer of sacral  region, stage 2 E11.621 Type 2 diabetes mellitus with foot ulcer E11.622 Type 2 diabetes mellitus with other skin ulcer Quantity: 1 sis : [id number] 09628 - WC PHYS SUBQ TISS 20 SQ CM ICD-10 Diagnosis Description L97.526 Non-pressure chronic ulcer of other part of left foot with bone involvement without evidence of necro Quantity: 1 sis Electronic Signature(s) Signed: 09/09/2021 12:32:42 PM By: Fredirick Maudlin MD FACS Entered By: Fredirick Maudlin on 09/09/2021 12:32:41

## 2021-09-17 ENCOUNTER — Ambulatory Visit: Payer: Medicare Other | Admitting: Gastroenterology

## 2021-09-20 ENCOUNTER — Ambulatory Visit (HOSPITAL_BASED_OUTPATIENT_CLINIC_OR_DEPARTMENT_OTHER): Payer: 59 | Admitting: General Surgery

## 2021-09-22 ENCOUNTER — Other Ambulatory Visit: Payer: Self-pay | Admitting: Neurosurgery

## 2021-09-22 ENCOUNTER — Other Ambulatory Visit (HOSPITAL_COMMUNITY): Payer: Self-pay | Admitting: Neurosurgery

## 2021-09-22 DIAGNOSIS — M48062 Spinal stenosis, lumbar region with neurogenic claudication: Secondary | ICD-10-CM

## 2021-09-30 ENCOUNTER — Ambulatory Visit: Payer: 59 | Admitting: Physician Assistant

## 2021-10-03 NOTE — Progress Notes (Addendum)
Referring Provider: Nanine Means Primary Care Physician:  Aletha Halim., PA-C Primary Gastroenterologist:  Dr. Abbey Chatters  Chief Complaint  Patient presents with   New Patient (Initial Visit)    Pt here for vomiting per Nanine Means    HPI:   [name] is a 76 y.o. female with history of anemia, atrial fibrillation, history of stroke, hyperlipidemia, diabetes, peripheral vascular disease, HTN, anxiety, chronic pressure sores/peripheral wounds, dementia, and more, presenting today at the request of [facility] for vomiting. Original referral was placed in June 2022.   She has been admitted several times this year in the setting of sepsis secondary to UTIs, healthcare acquired pneumonia, atrial fibrillation with RVR, iliopsoas abscess requiring drainage in February.  Today:  Patient states she does not know why she is here today.  Her only concern is that her "butt hurts".  She has a chronic pressure sore and receives wound care at [facility], but she continues to have pain.  Otherwise, she denies nausea, vomiting, heartburn symptoms, dysphagia, abdominal pain, constipation, diarrhea, BRBPR, or melena.  I spoke with nursing staff at [facility] who looked over her notes for the last month.  There have been no reports of vomiting or any other GI concerns.  The nurse confirmed that patient is receiving wound care for pressure sore on her buttock as well as her left toe.  She has Zofran for nausea as needed which seems to be most related to pain, but does not need this routinely.   Prior GI endoscopic evaluation: Colonoscopy 02/20/2015 with Dr. Benson Norway for IDA revealing nonbleeding cecal AVM s/p APC, 2 mm cecal polyp, transverse colon lipoma, diverticula.  No recommendations to repeat due to age and comorbidities. Pathology benign.  EGD 02/20/2015 with Dr. Benson Norway for IDA with normal exam.  Biopsies taken from the duodenum. Pathology benign.  Givens capsule 03/21/2016 with active bleeding  in the proximal small bowel Small bowel enteroscopy 03/23/2016 with Dr. Benson Norway for small bowel AVM revealing 1 nonbleeding superficial gastric ulcer, normal duodenum, normal examined jejunum. Small bowel enteroscopy 06/17/2016 with Dr. Benson Norway for small intestine AVM with normal exam.  Recommended repeating enteroscopy when there is evidence of active bleeding.    Past Medical History:  Diagnosis Date   Abnormal LFTs 07/24/2017   Actinic keratosis    Acute CVA (cerebrovascular accident) ([place])    Acute deep vein thrombosis (DVT) of left lower extremity ([place]) 05/06/2014   Anemia    hx of   Anemia of chronic renal failure, stage 3 (moderate) ([place]) 07/17/2015   Anticoagulant causing adverse effect in therapeutic use 07/17/2015   Anxiety    Arthritis    rheumatoid   Atrial fibrillation ([place])    a. persistent, she remains on Xarelto   Carpal tunnel syndrome    Chronic diastolic (congestive) heart failure ([place])    a. 04/2016: echo showing a preserved EF of 55-60% with mild MR. LA and RA midly dilated.    Edema    left leg at ankle resolved now   Gastroesophageal reflux disease    Hepatitis 1970   not sure what kind   Hiatal hernia    Hyperlipidemia    Hypertension    Iron deficiency anemia due to chronic blood loss 07/17/2015   Jaundice    age 63   Peripheral vascular disease ([place]) yrs ago   DVT left lower leg questionale told by 2 drs i had no clot, 1 md said i did   Rheumatoid arthritis(714.0)  Right knee pain    Stroke ([place])    Type II diabetes mellitus ([place])    type2    Past Surgical History:  Procedure Laterality Date   CATARACT EXTRACTION Bilateral    COLONOSCOPY WITH PROPOFOL N/A 02/20/2015   Surgeon: Carol Ada, MD; nonbleeding cecal AVM s/p APC, 2 mm cecal polyp, transverse colon lipoma, diverticula.  No recommendations to repeat due to age and comorbidities. Pathology benign.   ENTEROSCOPY N/A 03/23/2016   Surgeon: Carol Ada, MD;  1 nonbleeding superficial gastric  ulcer, normal duodenum, normal examined jejunum.   ENTEROSCOPY N/A 06/17/2016   Surgeon: Carol Ada, MD;  normal exam.   ESOPHAGOGASTRODUODENOSCOPY (EGD) WITH PROPOFOL N/A 02/20/2015   Surgeon: Carol Ada, MD; normal exam.  Biopsies taken from the duodenum. Pathology benign.   GIVENS CAPSULE STUDY N/A 03/21/2016   Procedure: GIVENS CAPSULE STUDY;  Surgeon: Carol Ada, MD;  active bleeding in the proximal small bowel   HAND SURGERY  1995 and 1996   artificial joints both hands   JOINT REPLACEMENT     LUMBAR LAMINECTOMY/DECOMPRESSION MICRODISCECTOMY N/A 02/10/2016   Procedure: MICROLUMBAR DECOMPRESSION L4-L5 AND L3- L4, AND EXCISION OF SYNOVIAL CYST L4-L5;  Surgeon: Susa Day, MD;  Location: [facility];  Service: Orthopedics;  Laterality: N/A;   TOTAL HIP ARTHROPLASTY Left 2009   TOTAL HIP ARTHROPLASTY Right 04/03/2013   Procedure: RIGHT TOTAL HIP ARTHROPLASTY ANTERIOR APPROACH;  Surgeon: Gearlean Alf, MD;  Location: [facility];  Service: Orthopedics;  Laterality: Right;    Current Outpatient Medications  Medication Sig Dispense Refill   Abatacept (ORENCIA CLICKJECT) 010 MG/ML SOAJ Inject into the skin.     acetaminophen (TYLENOL) 650 MG CR tablet Take 1,300 mg by mouth in the morning and at bedtime.     albuterol (VENTOLIN HFA) 108 (90 Base) MCG/ACT inhaler Inhale 2 puffs into the lungs every 4 (four) hours as needed for wheezing or shortness of breath.     azelastine (OPTIVAR) 0.05 % ophthalmic solution Place 1 drop into both eyes 2 (two) times daily.     buPROPion ER (WELLBUTRIN SR) 100 MG 12 hr tablet Take 1 tablet (100 mg total) by mouth 2 (two) times daily. 10 tablet 0   carboxymethylcellulose (ARTIFICIAL TEARS) 1 % ophthalmic solution Place 2 drops into both eyes in the morning.     cycloSPORINE (RESTASIS) 0.05 % ophthalmic emulsion Place 1 drop into both eyes 2 (two) times daily.     diclofenac Sodium (VOLTAREN) 1 % GEL Apply 2 g topically daily as needed for pain.     diltiazem  (CARDIZEM CD) 360 MG 24 hr capsule Take 1 capsule (360 mg total) by mouth daily.     ferrous sulfate 325 (65 FE) MG tablet Take 1 tablet (325 mg total) by mouth 2 (two) times daily with a meal.  3   fluticasone-salmeterol (ADVAIR HFA) 115-21 MCG/ACT inhaler Inhale 2 puffs into the lungs 2 times daily at 12 noon and 4 pm.     gabapentin (NEURONTIN) 100 MG capsule Take 1 capsule (100 mg total) by mouth 2 (two) times daily. 30 capsule 0   insulin lispro (HUMALOG) 100 UNIT/ML injection Inject 6 Units into the skin in the morning and at bedtime. Hold if BS <100     magnesium oxide (MAG-OX) 400 MG tablet Take 400 mg by mouth 2 (two) times daily.     metoprolol tartrate (LOPRESSOR) 50 MG tablet Take 1 tablet (50 mg total) by mouth 2 (two) times daily. 180 tablet  3   nitroGLYCERIN (NITROSTAT) 0.4 MG SL tablet Place 1 tablet (0.4 mg total) under the tongue every 5 (five) minutes as needed for chest pain (x 3 doses). 25 tablet 2   omeprazole (PRILOSEC OTC) 20 MG tablet Take 20 mg by mouth daily.     predniSONE (DELTASONE) 5 MG tablet Take 5 mg by mouth daily.     vitamin C (ASCORBIC ACID) 500 MG tablet Take 500 mg by mouth 2 (two) times daily.     zinc gluconate 50 MG tablet Take 50 mg by mouth daily.     saccharomyces boulardii (FLORASTOR) 250 MG capsule Take 250 mg by mouth daily. (Patient not taking: Reported on 10/04/2021)     No current facility-administered medications for this visit.    Allergies as of 10/04/2021 - Review Complete 08/09/2021  Allergen Reaction Noted   Promethazine Other (See Comments) 03/04/2018    Family History  Problem Relation Age of Onset   Pneumonia Mother 73   Stroke Father 67   Heart failure Sister    Hypertension Sister    Heart attack Neg Hx     Social History   Socioeconomic History   Marital status: Divorced    Spouse name: Not on file   Number of children: Not on file   Years of education: Not on file   Highest education level: Not on file  Occupational  History   Occupation: Retired  Tobacco Use   Smoking status: Never   Smokeless tobacco: Never  Vaping Use   Vaping Use: Never used  Substance and Sexual Activity   Alcohol use: No    Alcohol/week: 0.0 standard drinks of alcohol   Drug use: No   Sexual activity: Not Currently  Other Topics Concern   Not on file  Social History Narrative   Not on file   Social Determinants of Health   Financial Resource Strain: Not on file  Food Insecurity: Not on file  Transportation Needs: Not on file  Physical Activity: Not on file  Stress: Not on file  Social Connections: Not on file  Intimate Partner Violence: Not on file    Review of Systems: Gen: Denies any fever, chills, cold or flulike symptoms, presyncope, syncope.  CV: Denies chest pain, heart palpitations. Resp: Denies shortness of breath, cough. GI: See HPI GU : Denies urinary burning, urinary frequency, urinary hesitancy MS: Denies joint pain. Derm: Admits to pressure sore on her buttock.  Heme:  See HPI  Physical Exam: BP 113/75   Pulse (!) 53   Temp (!) 97.3 F (36.3 C)   Ht '5\' 7"'$  (1.702 m)   Wt 145 lb (65.8 kg)   BMI 22.71 kg/m  General:   Alert. Pleasant and cooperative. Well-nourished and well-developed. In a wheel chair. Frail appearing.  Head:  Normocephalic and atraumatic. Eyes:  Without icterus, sclera clear and conjunctiva pink.  Ears:  Normal auditory acuity. Lungs:  Clear to auscultation bilaterally. No wheezes, rales, or rhonchi. No distress.  Heart: Irregularly irregular rate and rhythm without tachycardia. Abdomen:  +BS, soft, non-tender and non-distended. No HSM noted. No guarding or rebound. No masses appreciated.  Rectal:  Deferred  Msk:  Symmetrical without gross deformities. Normal posture. Extremities:  Without edema. Neurologic:  Alert and  oriented x4;  grossly normal neurologically. Skin:  Intact without significant lesions or rashes. Psych:  Normal mood and affect.    Assessment:  76  y.o. female with history of anemia, atrial fibrillation, history of stroke, hyperlipidemia, diabetes,  peripheral vascular disease, HTN, anxiety, chronic pressure sores/peripheral wounds, dementia, and more, presenting today at the request of [facility] for vomiting; however, original referral was placed in June 2022. Patient's only complaint today was buttock pain.  She has chronic pressure sore on her buttock and is receiving wound care through [facility].  Otherwise, she has no significant GI symptoms or alarm symptoms.  Discussed with nursing staff at [facility] who states they have nothing on file indicating any sort of GI problems recently. She takes omeprazole 20 mg daily for heartburn and GI protection.    I do note that she has chronic history of IDA, previously extensively evaluated in 2016-2018, detailed in HPI with EGD, colonoscopy, Givens capsule, and 2 small bowel enteroscopy's.  She was found to have a cecal polyp, nonbleeding cecal AVM s/p APC in 2016.  No recommendations for repeat colonoscopy due to age.  In 2017, she had some active bleeding in the proximal small bowel, but follow-up enteroscopy was unable to identify a bleeding lesion in the small bowel.  She did have a nonbleeding superficial gastric ulcer.  Repeat small bowel enteroscopy in March 2018 with entirely normal exam.  She has had no overt GI bleeding, maintains on oral iron, and hemoglobin is fairly stable in the 10 range.  Iron panel normal in March this year. No need for any additional GI evaluation at this point.    Plan:  Continue omeprazole 20 mg daily.  Continue wound care with [facility] for pressure sore on buttock.  Continue oral iron.  Follow-up PRN.    Aliene Altes, PA-C [facility] 10/04/2021

## 2021-10-04 ENCOUNTER — Ambulatory Visit (INDEPENDENT_AMBULATORY_CARE_PROVIDER_SITE_OTHER): Payer: 59 | Admitting: Gastroenterology

## 2021-10-04 ENCOUNTER — Encounter: Payer: Self-pay | Admitting: Gastroenterology

## 2021-10-04 VITALS — BP 113/75 | HR 53 | Temp 97.3°F | Ht 67.0 in | Wt 145.0 lb

## 2021-10-04 DIAGNOSIS — R12 Heartburn: Secondary | ICD-10-CM | POA: Diagnosis not present

## 2021-10-04 DIAGNOSIS — L89309 Pressure ulcer of unspecified buttock, unspecified stage: Secondary | ICD-10-CM | POA: Diagnosis not present

## 2021-10-04 DIAGNOSIS — D649 Anemia, unspecified: Secondary | ICD-10-CM | POA: Diagnosis not present

## 2021-10-04 NOTE — Patient Instructions (Signed)
Continue omeprazole 20 mg daily.  Continue following with wound care at [facility] for management of the sore on your bottom and your toe.  We will follow-up with you as needed.  Aliene Altes, PA-C [facility]

## 2021-10-06 ENCOUNTER — Ambulatory Visit (HOSPITAL_COMMUNITY)
Admission: RE | Admit: 2021-10-06 | Discharge: 2021-10-06 | Disposition: A | Discharge status: Home / Self Care | Payer: 59 | Source: Ambulatory Visit | Attending: Neurosurgery | Admitting: Neurosurgery

## 2021-10-06 DIAGNOSIS — I4891 Unspecified atrial fibrillation: Secondary | ICD-10-CM | POA: Diagnosis not present

## 2021-10-06 DIAGNOSIS — A4159 Other Gram-negative sepsis: Secondary | ICD-10-CM | POA: Diagnosis not present

## 2021-10-06 DIAGNOSIS — M48062 Spinal stenosis, lumbar region with neurogenic claudication: Secondary | ICD-10-CM | POA: Insufficient documentation

## 2021-10-07 ENCOUNTER — Telehealth (HOSPITAL_COMMUNITY): Payer: Self-pay

## 2021-10-07 ENCOUNTER — Other Ambulatory Visit (HOSPITAL_COMMUNITY): Payer: Self-pay | Admitting: Neurosurgery

## 2021-10-07 ENCOUNTER — Other Ambulatory Visit (HOSPITAL_COMMUNITY): Payer: Self-pay | Admitting: Neuroradiology

## 2021-10-07 DIAGNOSIS — M8610 Other acute osteomyelitis, unspecified site: Secondary | ICD-10-CM

## 2021-10-07 NOTE — Telephone Encounter (Signed)
-----   Message from Pedro Earls, MD sent at 10/07/2021 10:03 AM EDT ----- Regarding: RE: Disc aspiration Yes, L4-5 disc. I read the MRI! ----- Message ----- From: Danielle Dess Sent: 10/07/2021   8:59 AM EDT To: Pedro Earls, MD; # Subject: Disc aspiration                                Kat,   New referral for L5 disc aspiration from neurosurgery, Dr. Christella Noa. Can I put this on for you? Please review. Her MRI lumbar was done yesterday.  Thanks,  Lia Foyer

## 2021-10-08 ENCOUNTER — Other Ambulatory Visit (HOSPITAL_COMMUNITY): Payer: Self-pay | Admitting: Physician Assistant

## 2021-10-08 ENCOUNTER — Emergency Department (HOSPITAL_COMMUNITY): Payer: 59

## 2021-10-08 ENCOUNTER — Ambulatory Visit (HOSPITAL_COMMUNITY)
Admission: RE | Admit: 2021-10-08 | Discharge: 2021-10-08 | Disposition: A | Discharge status: Home / Self Care | Payer: 59 | Source: Ambulatory Visit | Attending: Neurosurgery | Admitting: Neurosurgery

## 2021-10-08 ENCOUNTER — Other Ambulatory Visit: Payer: Self-pay | Admitting: Student

## 2021-10-08 ENCOUNTER — Inpatient Hospital Stay (HOSPITAL_COMMUNITY)
Admission: EM | Admit: 2021-10-08 | Discharge: 2021-11-02 | DRG: 853 | Disposition: E | Payer: 59 | Source: Skilled Nursing Facility | Attending: Internal Medicine | Admitting: Internal Medicine

## 2021-10-08 ENCOUNTER — Other Ambulatory Visit: Payer: Self-pay

## 2021-10-08 ENCOUNTER — Encounter (HOSPITAL_COMMUNITY): Payer: Self-pay

## 2021-10-08 DIAGNOSIS — A419 Sepsis, unspecified organism: Principal | ICD-10-CM

## 2021-10-08 DIAGNOSIS — Z66 Do not resuscitate: Secondary | ICD-10-CM | POA: Diagnosis present

## 2021-10-08 DIAGNOSIS — I5032 Chronic diastolic (congestive) heart failure: Secondary | ICD-10-CM | POA: Diagnosis present

## 2021-10-08 DIAGNOSIS — M4646 Discitis, unspecified, lumbar region: Secondary | ICD-10-CM | POA: Diagnosis present

## 2021-10-08 DIAGNOSIS — M4640 Discitis, unspecified, site unspecified: Secondary | ICD-10-CM

## 2021-10-08 DIAGNOSIS — N182 Chronic kidney disease, stage 2 (mild): Secondary | ICD-10-CM | POA: Diagnosis present

## 2021-10-08 DIAGNOSIS — M549 Dorsalgia, unspecified: Secondary | ICD-10-CM

## 2021-10-08 DIAGNOSIS — I4821 Permanent atrial fibrillation: Secondary | ICD-10-CM | POA: Diagnosis present

## 2021-10-08 DIAGNOSIS — D631 Anemia in chronic kidney disease: Secondary | ICD-10-CM | POA: Diagnosis present

## 2021-10-08 DIAGNOSIS — L304 Erythema intertrigo: Secondary | ICD-10-CM | POA: Diagnosis present

## 2021-10-08 DIAGNOSIS — Z8249 Family history of ischemic heart disease and other diseases of the circulatory system: Secondary | ICD-10-CM

## 2021-10-08 DIAGNOSIS — G9341 Metabolic encephalopathy: Secondary | ICD-10-CM | POA: Diagnosis not present

## 2021-10-08 DIAGNOSIS — R627 Adult failure to thrive: Secondary | ICD-10-CM | POA: Diagnosis present

## 2021-10-08 DIAGNOSIS — I130 Hypertensive heart and chronic kidney disease with heart failure and stage 1 through stage 4 chronic kidney disease, or unspecified chronic kidney disease: Secondary | ICD-10-CM | POA: Diagnosis present

## 2021-10-08 DIAGNOSIS — I4891 Unspecified atrial fibrillation: Secondary | ICD-10-CM | POA: Diagnosis present

## 2021-10-08 DIAGNOSIS — M4626 Osteomyelitis of vertebra, lumbar region: Secondary | ICD-10-CM | POA: Diagnosis present

## 2021-10-08 DIAGNOSIS — E785 Hyperlipidemia, unspecified: Secondary | ICD-10-CM | POA: Diagnosis not present

## 2021-10-08 DIAGNOSIS — Z20822 Contact with and (suspected) exposure to covid-19: Secondary | ICD-10-CM | POA: Diagnosis present

## 2021-10-08 DIAGNOSIS — M8610 Other acute osteomyelitis, unspecified site: Secondary | ICD-10-CM | POA: Insufficient documentation

## 2021-10-08 DIAGNOSIS — F0394 Unspecified dementia, unspecified severity, with anxiety: Secondary | ICD-10-CM | POA: Diagnosis present

## 2021-10-08 DIAGNOSIS — E1151 Type 2 diabetes mellitus with diabetic peripheral angiopathy without gangrene: Secondary | ICD-10-CM | POA: Diagnosis present

## 2021-10-08 DIAGNOSIS — Z7189 Other specified counseling: Secondary | ICD-10-CM | POA: Diagnosis not present

## 2021-10-08 DIAGNOSIS — K219 Gastro-esophageal reflux disease without esophagitis: Secondary | ICD-10-CM | POA: Diagnosis present

## 2021-10-08 DIAGNOSIS — E1122 Type 2 diabetes mellitus with diabetic chronic kidney disease: Secondary | ICD-10-CM | POA: Diagnosis present

## 2021-10-08 DIAGNOSIS — Z6824 Body mass index (BMI) 24.0-24.9, adult: Secondary | ICD-10-CM

## 2021-10-08 DIAGNOSIS — K6812 Psoas muscle abscess: Secondary | ICD-10-CM | POA: Diagnosis present

## 2021-10-08 DIAGNOSIS — Z888 Allergy status to other drugs, medicaments and biological substances status: Secondary | ICD-10-CM | POA: Diagnosis not present

## 2021-10-08 DIAGNOSIS — E1169 Type 2 diabetes mellitus with other specified complication: Secondary | ICD-10-CM | POA: Diagnosis not present

## 2021-10-08 DIAGNOSIS — A4159 Other Gram-negative sepsis: Principal | ICD-10-CM | POA: Diagnosis present

## 2021-10-08 DIAGNOSIS — Z7951 Long term (current) use of inhaled steroids: Secondary | ICD-10-CM

## 2021-10-08 DIAGNOSIS — Z79899 Other long term (current) drug therapy: Secondary | ICD-10-CM | POA: Diagnosis not present

## 2021-10-08 DIAGNOSIS — L89313 Pressure ulcer of right buttock, stage 3: Secondary | ICD-10-CM | POA: Diagnosis present

## 2021-10-08 DIAGNOSIS — I10 Essential (primary) hypertension: Secondary | ICD-10-CM | POA: Diagnosis present

## 2021-10-08 DIAGNOSIS — F01B11 Vascular dementia, moderate, with agitation: Secondary | ICD-10-CM | POA: Diagnosis not present

## 2021-10-08 DIAGNOSIS — Z539 Procedure and treatment not carried out, unspecified reason: Secondary | ICD-10-CM | POA: Insufficient documentation

## 2021-10-08 DIAGNOSIS — Z515 Encounter for palliative care: Secondary | ICD-10-CM | POA: Diagnosis not present

## 2021-10-08 DIAGNOSIS — Z794 Long term (current) use of insulin: Secondary | ICD-10-CM | POA: Diagnosis not present

## 2021-10-08 DIAGNOSIS — L8990 Pressure ulcer of unspecified site, unspecified stage: Secondary | ICD-10-CM | POA: Diagnosis present

## 2021-10-08 DIAGNOSIS — R6520 Severe sepsis without septic shock: Secondary | ICD-10-CM | POA: Diagnosis present

## 2021-10-08 DIAGNOSIS — Z7960 Long term (current) use of unspecified immunomodulators and immunosuppressants: Secondary | ICD-10-CM

## 2021-10-08 DIAGNOSIS — Z8673 Personal history of transient ischemic attack (TIA), and cerebral infarction without residual deficits: Secondary | ICD-10-CM

## 2021-10-08 DIAGNOSIS — F03911 Unspecified dementia, unspecified severity, with agitation: Secondary | ICD-10-CM | POA: Diagnosis present

## 2021-10-08 DIAGNOSIS — D849 Immunodeficiency, unspecified: Secondary | ICD-10-CM | POA: Diagnosis present

## 2021-10-08 DIAGNOSIS — M069 Rheumatoid arthritis, unspecified: Secondary | ICD-10-CM | POA: Diagnosis present

## 2021-10-08 DIAGNOSIS — Z7952 Long term (current) use of systemic steroids: Secondary | ICD-10-CM | POA: Diagnosis not present

## 2021-10-08 DIAGNOSIS — L89302 Pressure ulcer of unspecified buttock, stage 2: Secondary | ICD-10-CM | POA: Diagnosis not present

## 2021-10-08 DIAGNOSIS — Z823 Family history of stroke: Secondary | ICD-10-CM

## 2021-10-08 DIAGNOSIS — Z96643 Presence of artificial hip joint, bilateral: Secondary | ICD-10-CM | POA: Diagnosis present

## 2021-10-08 LAB — PROTIME-INR
INR: 1.4 — ABNORMAL HIGH (ref 0.8–1.2)
INR: 1.2 (ref 0.8–1.2)
Prothrombin Time: 16.9 seconds — ABNORMAL HIGH (ref 11.4–15.2)
Prothrombin Time: 15.4 seconds — ABNORMAL HIGH (ref 11.4–15.2)

## 2021-10-08 LAB — COMPREHENSIVE METABOLIC PANEL
ALT: 21 U/L (ref 0–44)
AST: 19 U/L (ref 15–41)
Albumin: 2.5 g/dL — ABNORMAL LOW (ref 3.5–5.0)
Alkaline Phosphatase: 152 U/L — ABNORMAL HIGH (ref 38–126)
Anion gap: 12 (ref 5–15)
BUN: 25 mg/dL — ABNORMAL HIGH (ref 8–23)
CO2: 20 mmol/L — ABNORMAL LOW (ref 22–32)
Calcium: 9.3 mg/dL (ref 8.9–10.3)
Chloride: 107 mmol/L (ref 98–111)
Creatinine, Ser: 0.97 mg/dL (ref 0.44–1.00)
GFR, Estimated: >60 mL/min (ref >60)
Glucose, Bld: 135 mg/dL — ABNORMAL HIGH (ref 70–99)
Potassium: 4.1 mmol/L (ref 3.5–5.1)
Sodium: 139 mmol/L (ref 135–145)
Total Bilirubin: 0.7 mg/dL (ref 0.3–1.2)
Total Protein: 6.6 g/dL (ref 6.5–8.1)

## 2021-10-08 LAB — GLUCOSE, CAPILLARY
Glucose-Capillary: 163 mg/dL — ABNORMAL HIGH (ref 70–99)
Glucose-Capillary: 160 mg/dL — ABNORMAL HIGH (ref 70–99)

## 2021-10-08 LAB — CBC WITH DIFFERENTIAL/PLATELET
Abs Immature Granulocytes: 0.13 10*3/uL — ABNORMAL HIGH (ref 0.00–0.07)
Basophils Absolute: 0.1 10*3/uL (ref 0.0–0.1)
Basophils Relative: 1 %
Eosinophils Absolute: 0.4 10*3/uL (ref 0.0–0.5)
Eosinophils Relative: 3 %
HCT: 35.7 % — ABNORMAL LOW (ref 36.0–46.0)
Hemoglobin: 11.1 g/dL — ABNORMAL LOW (ref 12.0–15.0)
Immature Granulocytes: 1 %
Lymphocytes Relative: 12 %
Lymphs Abs: 1.5 10*3/uL (ref 0.7–4.0)
MCH: 29.3 pg (ref 26.0–34.0)
MCHC: 31.1 g/dL (ref 30.0–36.0)
MCV: 94.2 fL (ref 80.0–100.0)
Monocytes Absolute: 0.7 10*3/uL (ref 0.1–1.0)
Monocytes Relative: 6 %
Neutro Abs: 10.2 10*3/uL — ABNORMAL HIGH (ref 1.7–7.7)
Neutrophils Relative %: 77 %
Platelets: 347 10*3/uL (ref 150–400)
RBC: 3.79 MIL/uL — ABNORMAL LOW (ref 3.87–5.11)
RDW: 16.3 % — ABNORMAL HIGH (ref 11.5–15.5)
WBC: 13.1 10*3/uL — ABNORMAL HIGH (ref 4.0–10.5)
nRBC: 0 % (ref 0.0–0.2)

## 2021-10-08 LAB — URINALYSIS, ROUTINE W REFLEX MICROSCOPIC
Bacteria, UA: NONE SEEN
Bilirubin Urine: NEGATIVE
Glucose, UA: NEGATIVE mg/dL
Hgb urine dipstick: NEGATIVE
Ketones, ur: NEGATIVE mg/dL
Leukocytes,Ua: NEGATIVE
Nitrite: NEGATIVE
Protein, ur: 100 mg/dL — AB
Specific Gravity, Urine: 1.014 (ref 1.005–1.030)
pH: 5 (ref 5.0–8.0)

## 2021-10-08 LAB — HEMOGLOBIN A1C
Hgb A1c MFr Bld: 5.6 % (ref 4.8–5.6)
Mean Plasma Glucose: 114.02 mg/dL

## 2021-10-08 LAB — LACTIC ACID, PLASMA
Lactic Acid, Venous: 1.8 mmol/L (ref 0.5–1.9)
Lactic Acid, Venous: 2.4 mmol/L (ref 0.5–1.9)
Lactic Acid, Venous: 1.9 mmol/L (ref 0.5–1.9)
Lactic Acid, Venous: 2.3 mmol/L (ref 0.5–1.9)

## 2021-10-08 LAB — TROPONIN I (HIGH SENSITIVITY)
Troponin I (High Sensitivity): 24 ng/L — ABNORMAL HIGH (ref <18)
Troponin I (High Sensitivity): 20 ng/L — ABNORMAL HIGH (ref <18)

## 2021-10-08 LAB — RESP PANEL BY RT-PCR (FLU A&B, COVID) ARPGX2
Influenza A by PCR: NEGATIVE
Influenza B by PCR: NEGATIVE
SARS Coronavirus 2 by RT PCR: NEGATIVE

## 2021-10-08 LAB — BRAIN NATRIURETIC PEPTIDE: B Natriuretic Peptide: 760.2 pg/mL — ABNORMAL HIGH (ref 0.0–100.0)

## 2021-10-08 LAB — MAGNESIUM: Magnesium: 1.6 mg/dL — ABNORMAL LOW (ref 1.7–2.4)

## 2021-10-08 LAB — TSH: TSH: 1.192 u[IU]/mL (ref 0.350–4.500)

## 2021-10-08 LAB — APTT: aPTT: 30 seconds (ref 24–36)

## 2021-10-08 MED ORDER — VANCOMYCIN HCL 750 MG/150ML IV SOLN
750.0000 mg | INTRAVENOUS | Status: AC
  Administered 2021-10-09 – 2021-10-10 (×2): 750 mg via INTRAVENOUS
  Filled 2021-10-08 (×2): qty 150

## 2021-10-08 MED ORDER — FERROUS SULFATE 325 (65 FE) MG PO TABS
325.0000 mg | ORAL_TABLET | Freq: Two times a day (BID) | ORAL | Status: DC
  Administered 2021-10-08 – 2021-10-14 (×12): 325 mg via ORAL
  Filled 2021-10-08 (×12): qty 1

## 2021-10-08 MED ORDER — METOPROLOL TARTRATE 50 MG PO TABS
50.0000 mg | ORAL_TABLET | Freq: Two times a day (BID) | ORAL | Status: DC
  Administered 2021-10-08 – 2021-10-10 (×5): 50 mg via ORAL
  Filled 2021-10-08 (×2): qty 1
  Filled 2021-10-08: qty 2
  Filled 2021-10-08 (×2): qty 1

## 2021-10-08 MED ORDER — DILTIAZEM HCL-DEXTROSE 125-5 MG/125ML-% IV SOLN (PREMIX)
5.0000 mg/h | INTRAVENOUS | Status: DC
  Administered 2021-10-08 – 2021-10-09 (×2): 5 mg/h via INTRAVENOUS
  Administered 2021-10-10: 7.5 mg/h via INTRAVENOUS
  Administered 2021-10-10 – 2021-10-12 (×3): 5 mg/h via INTRAVENOUS
  Administered 2021-10-13 – 2021-10-16 (×7): 15 mg/h via INTRAVENOUS
  Filled 2021-10-08 (×15): qty 125

## 2021-10-08 MED ORDER — VANCOMYCIN HCL IN DEXTROSE 1-5 GM/200ML-% IV SOLN: 1000.0000 mg | Freq: Once | INTRAVENOUS | Status: DC

## 2021-10-08 MED ORDER — CYCLOSPORINE 0.05 % OP EMUL
1.0000 [drp] | Freq: Two times a day (BID) | OPHTHALMIC | Status: DC
  Administered 2021-10-08 – 2021-10-17 (×18): 1 [drp] via OPHTHALMIC
  Filled 2021-10-08 (×23): qty 30

## 2021-10-08 MED ORDER — SODIUM CHLORIDE 0.45 % IV SOLN: INTRAVENOUS | Status: DC

## 2021-10-08 MED ORDER — ACETAMINOPHEN 325 MG PO TABS
650.0000 mg | ORAL_TABLET | ORAL | Status: DC | PRN
Start: 2021-10-08 — End: 2021-10-19

## 2021-10-08 MED ORDER — SACCHAROMYCES BOULARDII 250 MG PO CAPS
250.0000 mg | ORAL_CAPSULE | Freq: Every day | ORAL | Status: DC
  Administered 2021-10-08 – 2021-10-14 (×7): 250 mg via ORAL
  Filled 2021-10-08 (×9): qty 1

## 2021-10-08 MED ORDER — CEFEPIME HCL 2 G IV SOLR
2.0000 g | Freq: Two times a day (BID) | INTRAVENOUS | Status: DC
  Administered 2021-10-08 – 2021-10-14 (×13): 2 g via INTRAVENOUS
  Filled 2021-10-08 (×13): qty 12.5

## 2021-10-08 MED ORDER — LEFLUNOMIDE 20 MG PO TABS
20.0000 mg | ORAL_TABLET | Freq: Every day | ORAL | Status: DC
  Administered 2021-10-08 – 2021-10-14 (×7): 20 mg via ORAL
  Filled 2021-10-08 (×10): qty 1

## 2021-10-08 MED ORDER — ONDANSETRON HCL 4 MG/2ML IJ SOLN: 4.0000 mg | Freq: Four times a day (QID) | INTRAMUSCULAR | Status: DC | PRN

## 2021-10-08 MED ORDER — NITROGLYCERIN 0.4 MG SL SUBL: 0.4000 mg | SUBLINGUAL_TABLET | SUBLINGUAL | Status: DC | PRN

## 2021-10-08 MED ORDER — ENOXAPARIN SODIUM 40 MG/0.4ML IJ SOSY
40.0000 mg | PREFILLED_SYRINGE | INTRAMUSCULAR | Status: DC
  Administered 2021-10-08 – 2021-10-15 (×8): 40 mg via SUBCUTANEOUS
  Filled 2021-10-08 (×8): qty 0.4

## 2021-10-08 MED ORDER — CEFEPIME HCL 2 G IV SOLR
2.0000 g | Freq: Once | INTRAVENOUS | Status: AC
Start: 2021-10-08 — End: 2021-10-08
  Administered 2021-10-08: 2 g via INTRAVENOUS
  Filled 2021-10-08: qty 12.5

## 2021-10-08 MED ORDER — GERHARDT'S BUTT CREAM
TOPICAL_CREAM | CUTANEOUS | Status: DC | PRN
  Administered 2021-10-09: 1 via TOPICAL
  Filled 2021-10-08: qty 1

## 2021-10-08 MED ORDER — LACTATED RINGERS IV SOLN
INTRAVENOUS | Status: DC
Start: 2021-10-08 — End: 2021-10-08

## 2021-10-08 MED ORDER — GABAPENTIN 100 MG PO CAPS
100.0000 mg | ORAL_CAPSULE | Freq: Two times a day (BID) | ORAL | Status: DC
  Administered 2021-10-08 – 2021-10-14 (×13): 100 mg via ORAL
  Filled 2021-10-08 (×14): qty 1

## 2021-10-08 MED ORDER — MAGNESIUM SULFATE 2 GM/50ML IV SOLN
2.0000 g | Freq: Once | INTRAVENOUS | Status: AC
Start: 2021-10-08 — End: 2021-10-08
  Administered 2021-10-08: 2 g via INTRAVENOUS
  Filled 2021-10-08: qty 50

## 2021-10-08 MED ORDER — POLYVINYL ALCOHOL 1.4 % OP SOLN
2.0000 [drp] | Freq: Every day | OPHTHALMIC | Status: DC
Start: 2021-10-08 — End: 2021-10-16
  Administered 2021-10-09 – 2021-10-16 (×7): 2 [drp] via OPHTHALMIC
  Filled 2021-10-08 (×2): qty 15

## 2021-10-08 MED ORDER — METRONIDAZOLE 500 MG/100ML IV SOLN
500.0000 mg | Freq: Two times a day (BID) | INTRAVENOUS | Status: DC
  Administered 2021-10-08 – 2021-10-10 (×5): 500 mg via INTRAVENOUS
  Filled 2021-10-08 (×5): qty 100

## 2021-10-08 MED ORDER — LACTATED RINGERS IV BOLUS (SEPSIS)
1000.0000 mL | Freq: Once | INTRAVENOUS | Status: AC
  Administered 2021-10-08: 1000 mL via INTRAVENOUS

## 2021-10-08 MED ORDER — KETOTIFEN FUMARATE 0.025 % OP SOLN
1.0000 [drp] | Freq: Two times a day (BID) | OPHTHALMIC | Status: DC
  Administered 2021-10-08 – 2021-10-17 (×16): 1 [drp] via OPHTHALMIC
  Filled 2021-10-08 (×3): qty 5

## 2021-10-08 MED ORDER — HYDROMORPHONE HCL 1 MG/ML IJ SOLN
0.5000 mg | INTRAMUSCULAR | Status: DC | PRN
  Administered 2021-10-08 – 2021-10-14 (×5): 0.5 mg via INTRAVENOUS
  Filled 2021-10-08 (×6): qty 0.5

## 2021-10-08 MED ORDER — BUPROPION HCL ER (SR) 100 MG PO TB12
100.0000 mg | ORAL_TABLET | Freq: Two times a day (BID) | ORAL | Status: DC
  Administered 2021-10-08 – 2021-10-14 (×12): 100 mg via ORAL
  Filled 2021-10-08 (×17): qty 1

## 2021-10-08 MED ORDER — DILTIAZEM HCL ER COATED BEADS 180 MG PO CP24
360.0000 mg | ORAL_CAPSULE | Freq: Every day | ORAL | Status: DC
Start: 2021-10-08 — End: 2021-10-15
  Administered 2021-10-09 – 2021-10-14 (×6): 360 mg via ORAL
  Filled 2021-10-08 (×2): qty 2
  Filled 2021-10-08: qty 1
  Filled 2021-10-08 (×4): qty 2
  Filled 2021-10-08: qty 1
  Filled 2021-10-08: qty 2

## 2021-10-08 MED ORDER — PANTOPRAZOLE SODIUM 40 MG PO TBEC
40.0000 mg | DELAYED_RELEASE_TABLET | Freq: Two times a day (BID) | ORAL | Status: DC
  Administered 2021-10-08 – 2021-10-14 (×13): 40 mg via ORAL
  Filled 2021-10-08 (×14): qty 1

## 2021-10-08 MED ORDER — MAGNESIUM OXIDE -MG SUPPLEMENT 400 (240 MG) MG PO TABS
400.0000 mg | ORAL_TABLET | Freq: Two times a day (BID) | ORAL | Status: DC
  Administered 2021-10-08 – 2021-10-14 (×13): 400 mg via ORAL
  Filled 2021-10-08 (×14): qty 1

## 2021-10-08 MED ORDER — ALBUTEROL SULFATE (2.5 MG/3ML) 0.083% IN NEBU: 2.5000 mg | INHALATION_SOLUTION | RESPIRATORY_TRACT | Status: DC | PRN

## 2021-10-08 MED ORDER — SODIUM CHLORIDE 0.9 % IV SOLN: INTRAVENOUS | Status: DC

## 2021-10-08 MED ORDER — FUROSEMIDE 10 MG/ML IJ SOLN
40.0000 mg | Freq: Once | INTRAMUSCULAR | Status: AC
  Administered 2021-10-08: 40 mg via INTRAVENOUS
  Filled 2021-10-08: qty 4

## 2021-10-08 MED ORDER — METRONIDAZOLE 500 MG/100ML IV SOLN
500.0000 mg | Freq: Once | INTRAVENOUS | Status: AC
  Administered 2021-10-08: 500 mg via INTRAVENOUS
  Filled 2021-10-08: qty 100

## 2021-10-08 MED ORDER — MOMETASONE FURO-FORMOTEROL FUM 200-5 MCG/ACT IN AERO
2.0000 | INHALATION_SPRAY | Freq: Two times a day (BID) | RESPIRATORY_TRACT | Status: DC
  Administered 2021-10-09 – 2021-10-17 (×14): 2 via RESPIRATORY_TRACT
  Filled 2021-10-08: qty 8.8

## 2021-10-08 MED ORDER — OXYCODONE HCL 5 MG PO TABS
7.5000 mg | ORAL_TABLET | Freq: Four times a day (QID) | ORAL | Status: DC
  Administered 2021-10-08 – 2021-10-15 (×22): 7.5 mg via ORAL
  Filled 2021-10-08 (×25): qty 2

## 2021-10-08 MED ORDER — VANCOMYCIN HCL 1500 MG/300ML IV SOLN
1500.0000 mg | Freq: Once | INTRAVENOUS | Status: AC
  Administered 2021-10-08: 1500 mg via INTRAVENOUS
  Filled 2021-10-08: qty 300

## 2021-10-08 MED ORDER — ACETAMINOPHEN 325 MG PO TABS
1000.0000 mg | ORAL_TABLET | Freq: Three times a day (TID) | ORAL | Status: DC
  Administered 2021-10-08 – 2021-10-15 (×19): 975 mg via ORAL
  Filled 2021-10-08 (×21): qty 3

## 2021-10-08 MED ORDER — ORAL CARE MOUTH RINSE: 15.0000 mL | OROMUCOSAL | Status: DC | PRN

## 2021-10-08 MED ORDER — INSULIN ASPART 100 UNIT/ML IJ SOLN
0.0000 [IU] | Freq: Three times a day (TID) | INTRAMUSCULAR | Status: DC
  Administered 2021-10-08 – 2021-10-09 (×2): 4 [IU] via SUBCUTANEOUS
  Administered 2021-10-09 – 2021-10-10 (×4): 3 [IU] via SUBCUTANEOUS
  Administered 2021-10-12: 7 [IU] via SUBCUTANEOUS
  Administered 2021-10-13 (×2): 4 [IU] via SUBCUTANEOUS
  Administered 2021-10-14 – 2021-10-15 (×3): 3 [IU] via SUBCUTANEOUS

## 2021-10-08 MED ORDER — PROSOURCE PLUS PO LIQD
30.0000 mL | Freq: Every day | ORAL | Status: DC
  Administered 2021-10-12 – 2021-10-14 (×2): 30 mL via ORAL
  Filled 2021-10-08 (×5): qty 30

## 2021-10-08 MED ORDER — PREDNISONE 5 MG PO TABS
5.0000 mg | ORAL_TABLET | Freq: Every day | ORAL | Status: DC
  Administered 2021-10-08 – 2021-10-14 (×7): 5 mg via ORAL
  Filled 2021-10-08 (×7): qty 1

## 2021-10-08 NOTE — Progress Notes (Signed)
Upon arrival to radiology nurses station pt was noted to be tachycardiac HR in the 180s-190s and hypertensive 134/120. Dr Tennis Must Janean Sark called to room. Dr advised to transport pt to ED for emergent care. Pt transported with two RN to ED on continuous monitoring equipment. ED charge aware prior to transport.

## 2021-10-08 NOTE — Assessment & Plan Note (Signed)
Patient has a h/o multiple pressure wounds and diabetic foot ulcers: 3rd, 4th toes and plantar metatarsal head all of which appear to have healed. Sacral ulcer noted by Dr. Ouida Sills. Patient currently with santyl dressing.   Plan Wound care consult re: sacral wound

## 2021-10-08 NOTE — Progress Notes (Signed)
If patient stabilizes over the weekend IR will tentatively plan for disc aspiration Monday 10/11/21. IR will continue to follow and will place appropriate procedure orders when needed.  Soyla Dryer, [place] [phone] 10/23/2021, 4:59 PM

## 2021-10-08 NOTE — Assessment & Plan Note (Signed)
BP stable. Continue diltiazem and metoprolol

## 2021-10-08 NOTE — Assessment & Plan Note (Addendum)
Per MRI patient with right psoas abscess 4.3x3.2 cm. She previously had left psoas abscess that required aspiration.  Plan Abx coverage w/ Vanc/cefepime/flagy  May need aspiration of abscess.

## 2021-10-08 NOTE — Sepsis Progress Note (Signed)
Notified provider of need to order another repeat lactic acid, as the second was higher than the first.   

## 2021-10-08 NOTE — Assessment & Plan Note (Signed)
Prior h/o delerium and agitation with hospitalization. Not on psyhoactive meds.  Plan Close monitoring with re-orientation and assurance  Ativan 1 mg PO or 0.5 mg IV as needed for agitation.

## 2021-10-08 NOTE — ED Triage Notes (Signed)
Pt was brought up from IR dept. Where she was to get a procedure until they seen she was in SVT vs A-Fib RVR on their monitor. Pressures stable in the 130's SBP.

## 2021-10-08 NOTE — Assessment & Plan Note (Addendum)
Immunocompromised patient 2/2 steroids and immunologic agents for RA now with L4-5 discitis/osteomyelitis by MRI. Source of infection not clear but she has had multiple skin lesions as possible origin. In ED she was febrile, tachycardic, had a mild leukocytosis with normal differential. There is a h/o of infection related sepsis in her recent past. She was resuscitated with 2L LR bolus with rapid administration of Abx.  Plan Tele admit  Continue abx: Vanc/cefepime/flagyl pending blood culture information  Aspiration L4-5 per NS or IR for definitive microbiologic dx.  May need ID consult once culture data available  Pain control - continue oxycodone q 6; IV dilaudid for severe breakthrough pain

## 2021-10-08 NOTE — Assessment & Plan Note (Signed)
Last OV Dr. Stanford Breed for [place] 08/09/21 patient was in permanent a. Fib - not a candidate for anticoagulation due to bleeding risk and was euvolemic. She has not required chronic diuretics. CXR suggests asymmetric pulmonary edema. Rales L.R on exam.  Plan Add BNP to labs  Give Lasix 40 mg IV x 1 and additional treatment based on lab results and follow-up x-ray.

## 2021-10-08 NOTE — Progress Notes (Signed)
Called at [facility] for orders. Pasty Spillers, PA notified.

## 2021-10-08 NOTE — Sepsis Progress Note (Signed)
eLink is following this Code Sepsis. °

## 2021-10-08 NOTE — Assessment & Plan Note (Signed)
Last A1C 5.7% 05/21/21.   Plan Sliding scale coverage

## 2021-10-08 NOTE — Assessment & Plan Note (Addendum)
Patient with permanent a. Fib. She has had multiple episodes of RVR when septic or medically stressed. She is followed by Dr. Stanford Breed with last visit 08/09/21 where she was rate stable on diltiazem and metoprolol. Now with RVR associated with sepsis.  Plan Tele admit  Continue IV diltiazem until HR less than 110  Continue home doses of diltiazem and metoprolol.

## 2021-10-08 NOTE — Assessment & Plan Note (Signed)
Continue PPI at stress doses.

## 2021-10-08 NOTE — Sedation Documentation (Signed)
Pt came down from short stay with a heart rate over 180bpm. PA and Provider (Dr. Norma Fredrickson) came in and assessed pt. Pt placed on cardiac, bp and pulse ox monitor. Heart rate noted to be greater than 180bpm with possible afib RVR or SVT. This RN called the ED charge nurse and pt was taken to ED 8. Report given to [place], RN and the EDP. Pt alert and answering questions complaining of back pain.

## 2021-10-08 NOTE — Assessment & Plan Note (Signed)
Patient followed by Dr. Amil Amen with last OV 09/07/21 at which time she was stable.  Plan Continue home regimen including prednisone

## 2021-10-08 NOTE — Progress Notes (Signed)
Lab called to report a lactic acid of 2.3, MD on-call Dr. Marlowe Sax was notified and new order received for repeat lab in the am. Will continue to closely monitor pt. Delia Heady RN

## 2021-10-08 NOTE — Progress Notes (Signed)
Pt brought to IR from [facility] for planned Lumbar Disc aspiration. HR found to be 182,  BP 137/122.  Not in any respiratory distress. Pt A/O, reports feeling "good".  Prefers to lay on left side due to back pain.  Attempted 2 valsalva maneuvers which were unsuccessful. Informed patient her HR is dangerously high and needs to be fully evaluated in the ED.   Nursing facilitated transfer to ED. No IR procedure today.  Electronically Signed: Pasty Spillers, PA-C 10/23/2021, 10:25 AM

## 2021-10-08 NOTE — Assessment & Plan Note (Signed)
Patient on PO replacement.   Plan Continue 400 mg Mag bid  400 mg IV x 1

## 2021-10-08 NOTE — ED Provider Notes (Signed)
[facility] Provider Note   CSN: [id number] Arrival date & time: 10/11/2021  1023     History  Chief Complaint  Patient presents with   Atrial Fibrillation    With RVR     [name] is a 76 y.o. female.  The history is provided by the patient and medical records. The history is limited by the condition of the patient.  Illness Location:  No known infection in her back Quality:  Worsening Severity:  Moderate Onset quality:  Gradual Timing:  Constant Progression:  Worsening Chronicity:  New Associated symptoms: fatigue and fever (subjective at home)   Associated symptoms: no abdominal pain, no chest pain, no congestion, no cough, no diarrhea, no headaches, no loss of consciousness, no nausea, no rash, no shortness of breath, no vomiting and no wheezing        Home Medications Prior to Admission medications   Medication Sig Start Date End Date Taking? Authorizing Provider  Abatacept (ORENCIA CLICKJECT) 888 MG/ML SOAJ Inject 125 mg into the skin once a week. Thursday    [provider]  acetaminophen (TYLENOL) 650 MG CR tablet Take 1,300 mg by mouth 3 (three) times daily.    [provider]  albuterol (VENTOLIN HFA) 108 (90 Base) MCG/ACT inhaler Inhale 2 puffs into the lungs every 4 (four) hours as needed for wheezing or shortness of breath.    [provider]  Amino Acids-Protein Hydrolys (PRO-STAT) LIQD Take 30 mLs by mouth daily.    [provider]  azelastine (OPTIVAR) 0.05 % ophthalmic solution Place 1 drop into both eyes 2 (two) times daily.    [provider]  buPROPion ER (WELLBUTRIN SR) 100 MG 12 hr tablet Take 1 tablet (100 mg total) by mouth 2 (two) times daily. 07/30/21   Nita Sells, MD  carboxymethylcellulose (ARTIFICIAL TEARS) 1 % ophthalmic solution Place 2 drops into both eyes daily.    [provider]  cycloSPORINE (RESTASIS) 0.05 % ophthalmic emulsion Place  1 drop into both eyes 2 (two) times daily.    [provider]  Diaper Rash Products (DESITIN MULTI-PURPOSE HEALING) OINT Apply 1 Application topically 3 (three) times daily. To inner buttocks    [provider]  diclofenac Sodium (VOLTAREN) 1 % GEL Apply 2 g topically daily as needed for pain. To knees 03/15/20   [provider]  diltiazem (CARDIZEM CD) 360 MG 24 hr capsule Take 1 capsule (360 mg total) by mouth daily. 07/31/21   Nita Sells, MD  ferrous sulfate 325 (65 FE) MG tablet Take 1 tablet (325 mg total) by mouth 2 (two) times daily with a meal. 07/30/21   Nita Sells, MD  fluticasone-salmeterol (ADVAIR HFA) 115-21 MCG/ACT inhaler Inhale 2 puffs into the lungs 2 times daily at 12 noon and 4 pm.    [provider]  gabapentin (NEURONTIN) 100 MG capsule Take 1 capsule (100 mg total) by mouth 2 (two) times daily. 07/30/21   Nita Sells, MD  insulin lispro (HUMALOG) 100 UNIT/ML injection Inject 6 Units into the skin in the morning and at bedtime. HOLD if CBG < 100    [provider]  leflunomide (ARAVA) 20 MG tablet Take 20 mg by mouth daily.    [provider]  magnesium oxide (MAG-OX) 400 MG tablet Take 400 mg by mouth 2 (two) times daily.    [provider]  Menthol, Topical Analgesic, (BIOFREEZE) 4 % GEL Apply 1 Application topically every 6 (six)  hours as needed (pain).    [provider]  metoprolol tartrate (LOPRESSOR) 50 MG tablet Take 1 tablet (50 mg total) by mouth 2 (two) times daily. 09/07/21   Lelon Perla, MD  Multiple Vitamin (MULTIVITAMIN) tablet Take 1 tablet by mouth daily.    [provider]  nitroGLYCERIN (NITROSTAT) 0.4 MG SL tablet Place 1 tablet (0.4 mg total) under the tongue every 5 (five) minutes as needed for chest pain (x 3 doses). 11/28/16   Lelon Perla, MD  omeprazole (PRILOSEC) 20 MG capsule Take 20 mg by mouth in the morning.    [provider]   OVER THE COUNTER MEDICATION Apply 1 Application topically every 12 (twelve) hours as needed (hydration of skin). To bilateral lower extremity. Sween Cream.    [provider]  oxyCODONE HCl 7.5 MG TABS Take 7.5 mg by mouth every 6 (six) hours.    [provider]  predniSONE (DELTASONE) 5 MG tablet Take 5 mg by mouth daily.    [provider]  saccharomyces boulardii (FLORASTOR) 250 MG capsule Take 250 mg by mouth daily.    [provider]  vitamin C (ASCORBIC ACID) 500 MG tablet Take 500 mg by mouth 2 (two) times daily.    [provider]  Zinc 50 MG TABS Take 50 mg by mouth daily.    [provider]  ferrous gluconate (FERGON) 324 MG tablet Take 1 tablet (324 mg total) by mouth 2 (two) times daily with a meal. Patient not taking: Reported on 03/30/2020 05/17/16 04/01/20  Debbe Odea, MD  potassium chloride SA (K-DUR,KLOR-CON) 20 MEQ tablet Take 1 tablet (20 mEq total) by mouth 2 (two) times daily. Patient not taking: Reported on 03/30/2020 10/10/17 04/01/20  Kathie Dike, MD      Allergies    Phenergan [promethazine]    Review of Systems   Review of Systems  Constitutional:  Positive for chills, fatigue and fever (subjective at home). Negative for diaphoresis.  HENT:  Negative for congestion.   Respiratory:  Negative for cough, chest tightness, shortness of breath and wheezing.   Cardiovascular:  Negative for chest pain and palpitations.  Gastrointestinal:  Negative for abdominal pain, constipation, diarrhea, nausea and vomiting.  Genitourinary:  Negative for dysuria, flank pain and frequency.  Musculoskeletal:  Positive for back pain. Negative for neck pain and neck stiffness.  Skin:  Negative for rash and wound.  Neurological:  Negative for loss of consciousness, weakness, light-headedness, numbness and headaches.  Psychiatric/Behavioral:  Negative for agitation.   All other systems reviewed and are negative.   Physical  Exam Updated Vital Signs BP (!) 130/102   Pulse (!) 108   Resp 19   SpO2 97%  Physical Exam Vitals and nursing note reviewed.  Constitutional:      General: She is not in acute distress.    Appearance: She is well-developed. She is ill-appearing. She is not toxic-appearing or diaphoretic.  HENT:     Head: Normocephalic and atraumatic.     Nose: No congestion or rhinorrhea.     Mouth/Throat:     Mouth: Mucous membranes are moist.  Eyes:     Extraocular Movements: Extraocular movements intact.     Conjunctiva/sclera: Conjunctivae normal.     Pupils: Pupils are equal, round, and reactive to light.  Cardiovascular:     Rate and Rhythm: Tachycardia present. Rhythm irregular.     Pulses: Normal pulses.     Heart sounds: Murmur heard.  Pulmonary:  Effort: Pulmonary effort is normal. No respiratory distress.     Breath sounds: Normal breath sounds. No wheezing, rhonchi or rales.  Chest:     Chest wall: No tenderness.  Abdominal:     General: Abdomen is flat.     Palpations: Abdomen is soft.     Tenderness: There is no abdominal tenderness. There is no right CVA tenderness, left CVA tenderness, guarding or rebound.  Musculoskeletal:        General: Tenderness present. No swelling.     Cervical back: Neck supple.     Right lower leg: No edema.     Left lower leg: No edema.     Comments: Tenderness on mid low back.  Skin:    General: Skin is warm and dry.     Capillary Refill: Capillary refill takes less than 2 seconds.     Findings: No erythema or rash.  Neurological:     Mental Status: She is alert.     Sensory: No sensory deficit.     Motor: No weakness.  Psychiatric:        Mood and Affect: Mood normal.     ED Results / Procedures / Treatments   Labs (all labs ordered are listed, but only abnormal results are displayed) Labs Reviewed  MAGNESIUM - Abnormal; Notable for the following components:      Result Value   Magnesium 1.6 (*)    All other components within  normal limits  COMPREHENSIVE METABOLIC PANEL - Abnormal; Notable for the following components:   CO2 20 (*)    Glucose, Bld 135 (*)    BUN 25 (*)    Albumin 2.5 (*)    Alkaline Phosphatase 152 (*)    All other components within normal limits  CBC WITH DIFFERENTIAL/PLATELET - Abnormal; Notable for the following components:   WBC 13.1 (*)    RBC 3.79 (*)    Hemoglobin 11.1 (*)    HCT 35.7 (*)    RDW 16.3 (*)    Neutro Abs 10.2 (*)    Abs Immature Granulocytes 0.13 (*)    All other components within normal limits  PROTIME-INR - Abnormal; Notable for the following components:   Prothrombin Time 15.4 (*)    All other components within normal limits  URINALYSIS, ROUTINE W REFLEX MICROSCOPIC - Abnormal; Notable for the following components:   Protein, ur 100 (*)    All other components within normal limits  TROPONIN I (HIGH SENSITIVITY) - Abnormal; Notable for the following components:   Troponin I (High Sensitivity) 24 (*)    All other components within normal limits  CULTURE, BLOOD (ROUTINE X 2)  RESP PANEL BY RT-PCR (FLU A&B, COVID) ARPGX2  CULTURE, BLOOD (ROUTINE X 2)  URINE CULTURE  TSH  LACTIC ACID, PLASMA  APTT  LACTIC ACID, PLASMA  TROPONIN I (HIGH SENSITIVITY)    EKG EKG Interpretation  Date/Time:  Friday October 08 2021 10:30:24 EDT Ventricular Rate:  182 PR Interval:    QRS Duration: 64 QT Interval:  269 QTC Calculation: 487 R Axis:   102 Text Interpretation: Atrial fibrillation with rapid V-rate Ventricular premature complex Right axis deviation Low voltage, extremity and precordial leads ST depression, probably rate related when compared to prior, now afib with RVR. No STEMI Confirmed by Antony Blackbird [phone]) on 10/15/2021 10:46:28 AM  Radiology MR LUMBAR SPINE WO CONTRAST  Result Date: 10/06/2021 CLINICAL DATA:  Lumbar stenosis with neurogenic claudication M48.062 (ICD-10-CM). EXAM: MRI LUMBAR SPINE WITHOUT CONTRAST TECHNIQUE: Multiplanar,  multisequence MR  imaging of the lumbar spine was performed. No intravenous contrast was administered. COMPARISON:  MRI of the lumbar spine May 24, 2017. FINDINGS: Segmentation:  Standard. Alignment: Mild dextroconvex scoliosis. Grade 1 anterolisthesis of L4 over L5. Vertebrae: Endplate erosion at N5-6 with associated prominent marrow edema and increased T2 signal within the corresponding intervertebral disc concerning for discitis/osteomyelitis. The T2 hyperintensity within the disc appear to extend to the anterior epidural region. Postsurgical changes from L4-5 laminectomy. Conus medullaris and cauda equina: Conus extends to the T12-L1 level. Conus appear normal. Paraspinal and other soft tissues: Partially evaluated septated cystic appearing lesion in the right psoas muscle measuring at least 4.3 x 3.2 cm, most consistent with an abscess in the setting of discitis/osteomyelitis. Disc levels: T12-L1: No spinal canal or neural foraminal stenosis. L1-2: Loss of disc height, disc bulge with superimposed inferiorly migrating left central to subarticular disc extrusion, moderate facet degenerative changes and ligamentum flavum redundancy. Findings result in moderate spinal canal stenosis with effacement of the left subarticular zone, mild right and moderate left neural foraminal narrowing. Findings are new since prior MRI. L2-3: Disc bulge, moderate facet degenerative change ligamentum flavum redundancy resulting in mild spinal canal stenosis. No significant neural foraminal narrowing. L3-4: Loss of disc height, disc bulge with superimposed superiorly migrating left central to subarticular disc extrusion, advanced facet degenerative changes and ligamentum flavum redundancy resulting in severe spinal canal stenosis and moderate left neural foraminal narrowing. L4-5: T2 hyperintensity within the disc space, as described above, protruding into the spinal canal. Advanced facet degenerative changes. Findings result in mild spinal canal  stenosis with prominent narrowing of the bilateral subarticular zones, moderate right and mild left neural foraminal narrowing. L5-S1: Disc bulge and advanced facet degenerative changes resulting mild bilateral neural foraminal narrowing. No significant spinal canal stenosis. IMPRESSION: 1. Findings more consistent with discitis/osteomyelitis at L4-5 with fluid signal within the disc space protruding into the anterior epidural space. Associated right psoas abscess measuring at least 4.3 x 3.2 cm, only partially evaluated. 2. Degenerative changes of the lumbar spine with severe spinal canal stenosis at L3-4 and moderate at L1-2. These results will be called to the ordering clinician or representative by the Radiologist Assistant, and communication documented in the PACS or [employer]. Electronically Signed   By: Pedro Earls M.D.   On: 10/06/2021 15:27    Procedures Procedures    CRITICAL CARE Performed by: Gwenyth Allegra Patrina Andreas Total critical care time: 45 minutes Critical care time was exclusive of separately billable procedures and treating other patients. Critical care was necessary to treat or prevent imminent or life-threatening deterioration. Critical care was time spent personally by me on the following activities: development of treatment plan with patient and/or surrogate as well as nursing, discussions with consultants, evaluation of patient's response to treatment, examination of patient, obtaining history from patient or surrogate, ordering and performing treatments and interventions, ordering and review of laboratory studies, ordering and review of radiographic studies, pulse oximetry and re-evaluation of patient's condition.   Medications Ordered in ED Medications  diltiazem (CARDIZEM) 125 mg in dextrose 5% 125 mL (1 mg/mL) infusion (has no administration in time range)  lactated ringers infusion (has no administration in time range)  lactated ringers bolus  1,000 mL (has no administration in time range)    And  lactated ringers bolus 1,000 mL (has no administration in time range)  ceFEPIme (MAXIPIME) 2 g in sodium chloride 0.9 % 100 mL IVPB (has no administration in  time range)  metroNIDAZOLE (FLAGYL) IVPB 500 mg (has no administration in time range)  vancomycin (VANCOCIN) IVPB 1000 mg/200 mL premix (has no administration in time range)    ED Course/ Medical Decision Making/ A&P                           Medical Decision Making Amount and/or Complexity of Data Reviewed Labs: ordered. Radiology: ordered.  Risk Prescription drug management. Decision regarding hospitalization.    [name] is a 76 y.o. female with a past medical history significant for A-fib typically on Xarelto, diabetes, GERD, previous stroke, CHF, and recent episodes of sepsis most recently diagnosed with discitis/osteomyelitis of her back 2 days ago who presents from the interventional radiology suite for tachycardia.  According to documentation and patient report, she was told she had an infection in her back due to back pain she has had recently worsening.  She had MRI done 2 days ago that said discitis/osteomyelitis and she was in IR getting ready to have an aspiration of the area when she was found to be in A-fib with RVR with a rate into the 190s.  Patient currently denying any chest pain, shortness of breath, palpitations, nausea, vomiting, constipation, or diarrhea.  She is denying any numbness, tingling, weakness of extremities.  She is denying headache or neck pain and is only complaining of moderate to severe back pain that is intermittent in nature.  She denies any loss of bowel or bladder control and denies any trauma.  She is laying on her left side because that is the place of most comfort.  When she was in the preop area she was found to have a heart rate in the 190s that was unclear if it was SVT versus A-fib with RVR.  Patient quickly brought down for  evaluation.  EKG on my eval shows A-fib with RVR as does on her telemetry there is irregularity to it.  Does not appear SVT.  Patient does say that she held her Xarelto for the procedure and also held her diltiazem.  Vital signs on arrival show fever of 102.9 rectally, tachycardia, and tachypnea.  Clinically I am concerned that given her history of sepsis in this known infection in her back that she could be heading towards sepsis again.  We will make her a code sepsis and give her broad-spectrum antibiotics.  I spoke to pharmacy who agrees with antibiotic regimen.  We will give some fluids as she does not appear hypoxic or clinically fluid overloaded on exam.  She will need admission after work-up.  11:25 AM Just spoke to Dr. Annette Stable with neurosurgery.  He agrees with medicine admission and they will see her in consultation for further management of this back infection.  He was the one who saw her in clinic a month ago and ordered the MRI initially that discovered the discitis/osteomyelitis.  When labs are further along, will call for admission.        Final Clinical Impression(s) / ED Diagnoses Final diagnoses:  Sepsis, due to unspecified organism, unspecified whether acute organ dysfunction present ([place])  Acute back pain, unspecified back location, unspecified back pain laterality  Discitis, unspecified spinal region    Clinical Impression: 1. Sepsis, due to unspecified organism, unspecified whether acute organ dysfunction present ([place])   2. Acute back pain, unspecified back location, unspecified back pain laterality   3. Discitis, unspecified spinal region     Disposition: Admit  This  note was prepared with assistance of Systems analyst. Occasional wrong-word or sound-a-like substitutions may have occurred due to the inherent limitations of voice recognition software.     Lyrah Bradt, Gwenyth Allegra, MD 10/28/2021 1325

## 2021-10-08 NOTE — Subjective & Objective (Signed)
[name], a 76 y/o, with multiple co-morbidities including RA, DM, HFpEF, permanent a. Fib with episodes of RVR when sick, not a candidate for anticoagulation 2/2 h/o GI bleed from AVM, chronic blood loss anemia, CKD II, h/o CVA, Dementia, multiple pressure wounds and diabetic foot ulcers has had increasing back pain for several weeks. She is followed by Dr. Trenton Gammon for Neurosurgery. She had an MRI lumbar 10/06/21 revealing L4-5 discitis/osteomyelitis and a right psoas abscess. She was in IR dept for aspiration bx of lumbar lesion today and experience A. Fib with RVR to a rate of 182. She was transported to ED for emergent evaluation given her history of multiple episodes of s. Fib with RVR when septic.

## 2021-10-08 NOTE — Progress Notes (Signed)
Pharmacy Antibiotic Note  [name] is a 76 y.o. female admitted on 10/09/2021 with sepsis 2/2 osteomyelitis of L4-L5. Plans for IR 10/27/2021 AM with lumbar asipiration but found to be in SVT and brought to ED. Pharmacy has been consulted for vancomycin and cefepime dosing.  Plan: Vancomycin '1500mg'$  load x 1 then vancomycin 750 mg q24h Cefepime 2g q12h  F/u renal function, clinical course, and length of therapy    Temp (24hrs), Avg:100.5 F (38.1 C), Min:98 F (36.7 C), Max:102.9 F (39.4 C)  Recent Labs  Lab 10/07/2021 1054  WBC 13.1*  LATICACIDVEN 1.8    CrCl cannot be calculated (Patient's most recent lab result is older than the maximum 21 days allowed.).    Allergies  Allergen Reactions   Phenergan [Promethazine] Other (See Comments)    Listed on MAR Unknown reaction    Antimicrobials this admission: Cefepime 7/7 > Vancomycin 7/7 > Flagyl 7/7 >  Dose adjustments this admission:  Microbiology results: 7/7 BCx: sent 7/7 UCx: sent   Thank you for allowing pharmacy to be a part of this patient's care.  Levonne Spiller 10/14/2021 12:06 PM

## 2021-10-08 NOTE — Consult Note (Addendum)
[facility] Nurse Consult Note: Reason for Consult: Consult requested for sacrum. Assessed patient in the ED on a stretcher.  Pt actually has a wound to the right upper buttock, near the sacrum, but actually in the location as noted. Chronic Stage 3 pressure injury; .2X.2X.2cm, red and dry.  Bilat buttocks surrounding are red moist and macerated with patchy areas of partial thickness skin loss.  Appearance is consistent with moisture associated skin damage and intertrigo. Pt is incontinent of urine and stool. Pressure Injury POA: Yes  ICD-10 CM Codes for Irritant Dermatitis L24A2 - Due to fecal, urinary or dual incontinence  Right great toe with visible deformity; dry red raised callous, no open wound or drainage; no topical treatment is indicated.   Dressing procedure/placement/frequency: Topical treatment orders provided for bedside nurses to perform as follows to repel moisture and promote healing: Apply small foam dressing to right upper buttock.  (Do not use larger size to cover lower bilat buttocks. Leave these areas open to air and apply barrier cream and antifungal powder PRN with each turning and cleaning session. Please re-consult if further assistance is needed.  Thank-you,  Julien Girt MSN, [place], [place], [place], [place]

## 2021-10-08 NOTE — Progress Notes (Addendum)
Pt can not communicate history. Unknown if pt has eaten, I called [place] [facility] and was on the line 5 minutes and transferred 2 times. Pt is now leaving via transport. Unable to finish assessment. Vitals checked twice by CNA Angie. Page RN and myself was not able to palpate pulse prior to the pt leaving. All that was done was an IV and blood drawn, I was on phone waiting for [place] to answer information for me/ eaten or not. Reported incident to Kandis Nab  RN AD

## 2021-10-08 NOTE — ED Notes (Signed)
Notified provider and primary nurse of critical lactic acid 2.4

## 2021-10-08 NOTE — Progress Notes (Signed)
Patient with recently discovered spontaneous L4-L5 osteomyelitis diskitis.  No evidence of significant epidural abscess or any neurologic involvement at present.  Previously recommended outpatient aspiration and treatment with IV antibiotics.  Patient now with worsening of her clinical situation including SVT and possible sepsis.  I do not feel that any additional imaging is necessary as her scan is only a couple days old.  I would once again recommend aspiration and urgent treatment with IV antibiotics with a focus on Staph aureus coverage.

## 2021-10-08 NOTE — H&P (Signed)
History and Physical    [name]:[id number] DOB: 06-Oct-1945 DOA: 10/20/2021  DOS: the patient was seen and examined on 10/31/2021  PCP: Aletha Halim., PA-C   Patient coming from: SNF  I have personally briefly reviewed patient's old medical records in [facility] Link  [name], a 76 y/o, with multiple co-morbidities including RA, DM, HFpEF, permanent a. Fib with episodes of RVR when sick, not a candidate for anticoagulation 2/2 h/o GI bleed from AVM, chronic blood loss anemia, CKD II, h/o CVA, Dementia, multiple pressure wounds and diabetic foot ulcers has had increasing back pain for several weeks. She is followed by Dr. Trenton Gammon for Neurosurgery. She had an MRI lumbar 10/06/21 revealing L4-5 discitis/osteomyelitis and a right psoas abscess. She was in IR dept for aspiration bx of lumbar lesion today and experience A. Fib with RVR to a rate of 182. She was transported to ED for emergent evaluation given her history of multiple episodes of s. Fib with RVR when septic.    ED Course: Febrile to 102.9 rectally, BP 135/90, HR 93-180, RR 33. EKG revealed A. Fib with RVR. WBC 13.1 w/ nl Diff. Code sepsis initiated: patient received 2L LR, abx with Vanc, cefepime, Flagyl. Dr. Trenton Gammon for NS consulted. Recommendation is for admission to [facility] for Abx, complex medical management and NS to follow for possible aspiration/bx of lumbar lesion.   Review of Systems: caveat - known dementia. Mostly yes/no questions Review of Systems  Constitutional:  Positive for fever. Negative for chills.  HENT: Negative.    Eyes: Negative.   Respiratory:  Negative for cough and shortness of breath.   Cardiovascular:  Positive for palpitations. Negative for chest pain and leg swelling.  Gastrointestinal:  Negative for abdominal pain, constipation, heartburn and vomiting.  Genitourinary: Negative.   Musculoskeletal:  Positive for back pain and joint pain.  Skin:        Recent pressure wounds both legs and toes now  doing well  Neurological:  Negative for dizziness, sensory change and loss of consciousness.    Past Medical History:  Diagnosis Date   Abnormal LFTs 07/24/2017   Actinic keratosis    Acute CVA (cerebrovascular accident) ([place])    Acute deep vein thrombosis (DVT) of left lower extremity ([place]) 05/06/2014   Anemia    hx of   Anemia of chronic renal failure, stage 3 (moderate) ([place]) 07/17/2015   Anticoagulant causing adverse effect in therapeutic use 07/17/2015   Anxiety    Arthritis    rheumatoid   Atrial fibrillation ([place])    a. persistent, she remains on Xarelto   Carpal tunnel syndrome    Chronic diastolic (congestive) heart failure ([place])    a. 04/2016: echo showing a preserved EF of 55-60% with mild MR. LA and RA midly dilated.    Edema    left leg at ankle resolved now   Gastroesophageal reflux disease    Hepatitis 1970   not sure what kind   Hiatal hernia    Hyperlipidemia    Hypertension    Iron deficiency anemia due to chronic blood loss 07/17/2015   Jaundice    age 36   Peripheral vascular disease ([place]) yrs ago   DVT left lower leg questionale told by 2 drs i had no clot, 1 md said i did   Rheumatoid arthritis(714.0)    Right knee pain    Stroke ([place])    Type II diabetes mellitus ([place])    type2    Past  Surgical History:  Procedure Laterality Date   CATARACT EXTRACTION Bilateral    COLONOSCOPY WITH PROPOFOL N/A 02/20/2015   Surgeon: Carol Ada, MD; nonbleeding cecal AVM s/p APC, 2 mm cecal polyp, transverse colon lipoma, diverticula.  No recommendations to repeat due to age and comorbidities. Pathology benign.   ENTEROSCOPY N/A 03/23/2016   Surgeon: Carol Ada, MD;  1 nonbleeding superficial gastric ulcer, normal duodenum, normal examined jejunum.   ENTEROSCOPY N/A 06/17/2016   Surgeon: Carol Ada, MD;  normal exam.   ESOPHAGOGASTRODUODENOSCOPY (EGD) WITH PROPOFOL N/A 02/20/2015   Surgeon: Carol Ada, MD; normal exam.  Biopsies taken from the duodenum.  Pathology benign.   GIVENS CAPSULE STUDY N/A 03/21/2016   Procedure: GIVENS CAPSULE STUDY;  Surgeon: Carol Ada, MD;  active bleeding in the proximal small bowel   HAND SURGERY  1995 and 1996   artificial joints both hands   JOINT REPLACEMENT     LUMBAR LAMINECTOMY/DECOMPRESSION MICRODISCECTOMY N/A 02/10/2016   Procedure: MICROLUMBAR DECOMPRESSION L4-L5 AND L3- L4, AND EXCISION OF SYNOVIAL CYST L4-L5;  Surgeon: Susa Day, MD;  Location: [facility] ORS;  Service: Orthopedics;  Laterality: N/A;   TOTAL HIP ARTHROPLASTY Left 2009   TOTAL HIP ARTHROPLASTY Right 04/03/2013   Procedure: RIGHT TOTAL HIP ARTHROPLASTY ANTERIOR APPROACH;  Surgeon: Gearlean Alf, MD;  Location: [facility] ORS;  Service: Orthopedics;  Laterality: Right;    Soc Hx - married and divorced x 3. Has one son. Currently in SNF for rehab.   reports that she has never smoked. She has never used smokeless tobacco. She reports that she does not drink alcohol and does not use drugs.  Allergies  Allergen Reactions   Phenergan [Promethazine] Other (See Comments)    Listed on MAR Unknown reaction    Family History  Problem Relation Age of Onset   Pneumonia Mother 59   Stroke Father 32   Heart failure Sister    Hypertension Sister    Heart attack Neg Hx     Prior to Admission medications   Medication Sig Start Date End Date Taking? Authorizing Provider  Abatacept (ORENCIA CLICKJECT) 353 MG/ML SOAJ Inject 125 mg into the skin once a week. Thursday    [provider]  acetaminophen (TYLENOL) 650 MG CR tablet Take 1,300 mg by mouth 3 (three) times daily.    [provider]  albuterol (VENTOLIN HFA) 108 (90 Base) MCG/ACT inhaler Inhale 2 puffs into the lungs every 4 (four) hours as needed for wheezing or shortness of breath.    [provider]  Amino Acids-Protein Hydrolys (PRO-STAT) LIQD Take 30 mLs by mouth daily.    [provider]  azelastine (OPTIVAR) 0.05 % ophthalmic solution Place 1 drop  into both eyes 2 (two) times daily.    [provider]  buPROPion ER (WELLBUTRIN SR) 100 MG 12 hr tablet Take 1 tablet (100 mg total) by mouth 2 (two) times daily. 07/30/21   Nita Sells, MD  carboxymethylcellulose (ARTIFICIAL TEARS) 1 % ophthalmic solution Place 2 drops into both eyes daily.    [provider]  cycloSPORINE (RESTASIS) 0.05 % ophthalmic emulsion Place 1 drop into both eyes 2 (two) times daily.    [provider]  Diaper Rash Products (DESITIN MULTI-PURPOSE HEALING) OINT Apply 1 Application topically 3 (three) times daily. To inner buttocks    [provider]  diclofenac Sodium (VOLTAREN) 1 % GEL Apply 2 g topically daily as needed for pain. To knees 03/15/20   [provider]  diltiazem (CARDIZEM  CD) 360 MG 24 hr capsule Take 1 capsule (360 mg total) by mouth daily. 07/31/21   Nita Sells, MD  ferrous sulfate 325 (65 FE) MG tablet Take 1 tablet (325 mg total) by mouth 2 (two) times daily with a meal. 07/30/21   Nita Sells, MD  fluticasone-salmeterol (ADVAIR HFA) 115-21 MCG/ACT inhaler Inhale 2 puffs into the lungs 2 times daily at 12 noon and 4 pm.    [provider]  gabapentin (NEURONTIN) 100 MG capsule Take 1 capsule (100 mg total) by mouth 2 (two) times daily. 07/30/21   Nita Sells, MD  insulin lispro (HUMALOG) 100 UNIT/ML injection Inject 6 Units into the skin in the morning and at bedtime. HOLD if CBG < 100    [provider]  leflunomide (ARAVA) 20 MG tablet Take 20 mg by mouth daily.    [provider]  magnesium oxide (MAG-OX) 400 MG tablet Take 400 mg by mouth 2 (two) times daily.    [provider]  Menthol, Topical Analgesic, (BIOFREEZE) 4 % GEL Apply 1 Application topically every 6 (six) hours as needed (pain).    [provider]  metoprolol tartrate (LOPRESSOR) 50 MG tablet Take 1 tablet (50 mg total) by mouth 2 (two) times daily. 09/07/21   Lelon Perla, MD  Multiple Vitamin (MULTIVITAMIN) tablet Take 1 tablet by mouth daily.    [provider]  nitroGLYCERIN (NITROSTAT) 0.4 MG SL tablet Place 1 tablet (0.4 mg total) under the tongue every 5 (five) minutes as needed for chest pain (x 3 doses). 11/28/16   Lelon Perla, MD  omeprazole (PRILOSEC) 20 MG capsule Take 20 mg by mouth in the morning.    [provider]  OVER THE COUNTER MEDICATION Apply 1 Application topically every 12 (twelve) hours as needed (hydration of skin). To bilateral lower extremity. Sween Cream.    [provider]  oxyCODONE HCl 7.5 MG TABS Take 7.5 mg by mouth every 6 (six) hours.    [provider]  predniSONE (DELTASONE) 5 MG tablet Take 5 mg by mouth daily.    [provider]  saccharomyces boulardii (FLORASTOR) 250 MG capsule Take 250 mg by mouth daily.    [provider]  vitamin C (ASCORBIC ACID) 500 MG tablet Take 500 mg by mouth 2 (two) times daily.    [provider]  Zinc 50 MG TABS Take 50 mg by mouth daily.    [provider]  ferrous gluconate (FERGON) 324 MG tablet Take 1 tablet (324 mg total) by mouth 2 (two) times daily with a meal. Patient not taking: Reported on 03/30/2020 05/17/16 04/01/20  Debbe Odea, MD  potassium chloride SA (K-DUR,KLOR-CON) 20 MEQ tablet Take 1 tablet (20 mEq total) by mouth 2 (two) times daily. Patient not taking: Reported on 03/30/2020 10/10/17 04/01/20  Kathie Dike, MD    Physical Exam: Vitals:   10/15/2021 1130 10/09/2021 1215 10/15/2021 1230 10/02/2021 1330  BP: 101/84 110/82 135/90 (!) 139/111  Pulse: (!) 185 81 93 76  Resp: (!) 22 (!) 29 (!) 33 (!) 24  Temp:      TempSrc:      SpO2: 98% 98% 95% 98%    Physical Exam Vitals and nursing note reviewed.  Constitutional:      Appearance: She is not ill-appearing.     Comments: Thin, chronically ill appearance  HENT:     Head: Normocephalic and atraumatic.     Mouth/Throat:     Mouth: Mucous  membranes are dry.     Comments: Mostly edentulous, dry lips, furred tongue. Eyes:     Extraocular Movements: Extraocular movements intact.     Conjunctiva/sclera: Conjunctivae normal.     Pupils: Pupils are equal, round, and reactive to light.  Neck:     Vascular: No carotid bruit.     Comments: No thyromegaly Cardiovascular:     Rate and Rhythm: Tachycardia present. Rhythm irregular.     Chest Wall: No thrill.     Pulses: Decreased pulses.          Carotid pulses are 1+ on the right side and 1+ on the left side.      Radial pulses are 1+ on the right side and 1+ on the left side.       Femoral pulses are 1+ on the right side and 1+ on the left side.      Popliteal pulses are 1+ on the right side and 1+ on the left side.       Dorsalis pedis pulses are 1+ on the right side and 1+ on the left side.       Posterior tibial pulses are 1+ on the right side and 1+ on the left side.     Heart sounds: Heart sounds are distant.  Pulmonary:     Effort: Pulmonary effort is normal. No respiratory distress.     Breath sounds: No stridor. Rales present.     Comments: Rales left base to mid-lung Abdominal:     General: Bowel sounds are normal.     Palpations: Abdomen is soft.     Tenderness: There is no guarding or rebound.  Genitourinary:    General: Normal vulva.  Musculoskeletal:        General: No swelling or tenderness.     Cervical back: Neck supple. No rigidity.  Skin:    General: Skin is warm and dry.     Comments: Dressing on sacrum - not removed for exam. Distal LE clear of wounds. Dressing on medial aspect right great toe.   Neurological:     General: No focal deficit present.     Mental Status: She is alert.     Cranial Nerves: No cranial nerve deficit.  Psychiatric:        Mood and Affect: Mood normal.      Labs on Admission: I have personally reviewed following labs and imaging studies  CBC: Recent Labs  Lab 10/12/2021 1054  WBC 13.1*  NEUTROABS 10.2*  HGB 11.1*   HCT 35.7*  MCV 94.2  PLT 062   Basic Metabolic Panel: Recent Labs  Lab 10/22/2021 1054  NA 139  K 4.1  CL 107  CO2 20*  GLUCOSE 135*  BUN 25*  CREATININE 0.97  CALCIUM 9.3  MG 1.6*   GFR: Estimated Creatinine Clearance: 40.8 mL/min (by C-G formula based on SCr of 0.97 mg/dL). Liver Function Tests: Recent Labs  Lab 10/23/2021 1054  AST 19  ALT 21  ALKPHOS 152*  BILITOT 0.7  PROT 6.6  ALBUMIN 2.5*   No results for input(s): "LIPASE", "AMYLASE" in the last 168 hours. No results for input(s): "AMMONIA" in the last 168 hours. Coagulation Profile: Recent Labs  Lab 10/15/2021 1004 10/11/2021 1054  INR 1.4* 1.2   Cardiac Enzymes: No results for input(s): "CKTOTAL", "CKMB", "CKMBINDEX", "TROPONINI" in the last 168 hours. BNP (last 3 results) No results for input(s): "PROBNP" in the last 8760 hours. HbA1C: No results for input(s): "HGBA1C" in the last  72 hours. CBG: Recent Labs  Lab 10/29/2021 0840  GLUCAP 163*   Lipid Profile: No results for input(s): "CHOL", "HDL", "LDLCALC", "TRIG", "CHOLHDL", "LDLDIRECT" in the last 72 hours. Thyroid Function Tests: Recent Labs    10/10/2021 1054  TSH 1.192   Anemia Panel: No results for input(s): "VITAMINB12", "FOLATE", "FERRITIN", "TIBC", "IRON", "RETICCTPCT" in the last 72 hours. Urine analysis:    Component Value Date/Time   COLORURINE YELLOW 10/18/2021 1042   APPEARANCEUR CLEAR 10/29/2021 1042   LABSPEC 1.014 10/14/2021 1042   PHURINE 5.0 10/12/2021 1042   GLUCOSEU NEGATIVE 10/20/2021 1042   HGBUR NEGATIVE 10/07/2021 1042   BILIRUBINUR NEGATIVE 10/20/2021 1042   KETONESUR NEGATIVE 10/09/2021 1042   PROTEINUR 100 (A) 10/22/2021 1042   UROBILINOGEN 1.0 10/09/2014 0040   NITRITE NEGATIVE 10/06/2021 1042   LEUKOCYTESUR NEGATIVE 10/03/2021 1042    Radiological Exams on Admission: I have personally reviewed images DG Chest Port 1 View  Result Date: 10/13/2021 CLINICAL DATA:  Atrial fibrillation.  Discitis-osteomyelitis.  EXAM: PORTABLE CHEST 1 VIEW COMPARISON:  08/02/2021 FINDINGS: Defibrillator patches noted. Cephalization of blood flow with pulmonary vascular indistinctness and left greater than right interstitial accentuation. Fine Kerley B lines noted. Upper normal heart size. No substantial blunting of the costophrenic angles. Thoracic spondylosis.  Old healed left lateral rib deformities. IMPRESSION: 1. Asymmetric interstitial accentuation and vascular indistinctness, left greater than right, compatible with asymmetric pulmonary edema or possibly early aspiration pneumonitis on the left. Upper normal heart size. Electronically Signed   By: Van Clines M.D.   On: 10/21/2021 11:46    EKG: I have personally reviewed EKG: A fib at 182, no STEMI  Assessment/Plan Principal Problem:   Atrial fibrillation with RVR (HCC) Active Problems:   A-fib (HCC)   Septic discitis of lumbar region   Psoas abscess, right (HCC)   Type 2 diabetes mellitus with hyperlipidemia (HCC)   Rheumatoid arthritis (HCC)   Chronic diastolic (congestive) heart failure (HCC)   Pressure injury of skin   Dementia with agitation (HCC)   Hypertension   Gastroesophageal reflux disease   Hypomagnesemia    Assessment and Plan: Psoas abscess, right ([place]) Per MRI patient with right psoas abscess 4.3x3.2 cm. She previously had left psoas abscess that required aspiration.  Plan Abx coverage w/ Vanc/cefepime/flagy  May need aspiration of abscess.  Septic discitis of lumbar region Immunocompromised patient 2/2 steroids and immunologic agents for RA now with L4-5 discitis/osteomyelitis by MRI. Source of infection not clear but she has had multiple skin lesions as possible origin. In ED she was febrile, tachycardic, had a mild leukocytosis with normal differential. There is a h/o of infection related sepsis in her recent past. She was resuscitated with 2L LR bolus with rapid administration of Abx.  Plan Tele admit  Continue abx:  Vanc/cefepime/flagyl pending blood culture information  Aspiration L4-5 per NS or IR for definitive microbiologic dx.  May need ID consult once culture data available  Pain control - continue oxycodone q 6; IV dilaudid for severe breakthrough pain  A-fib [facility]) Patient with permanent a. Fib. She has had multiple episodes of RVR when septic or medically stressed. She is followed by Dr. Stanford Breed with last visit 08/09/21 where she was rate stable on diltiazem and metoprolol. Now with RVR associated with sepsis.  Plan Tele admit  Continue IV diltiazem until HR less than 110  Continue home doses of diltiazem and metoprolol.   Dementia with agitation ([place]) Prior h/o delerium and agitation with hospitalization. Not on psyhoactive  meds.  Plan Close monitoring with re-orientation and assurance  Ativan 1 mg PO or 0.5 mg IV as needed for agitation.  Pressure injury of skin Patient has a h/o multiple pressure wounds and diabetic foot ulcers: 3rd, 4th toes and plantar metatarsal head all of which appear to have healed. Sacral ulcer noted by Dr. Ouida Sills. Patient currently with santyl dressing.   Plan Wound care consult re: sacral wound  Chronic diastolic (congestive) heart failure (HCC) Last OV Dr. Stanford Breed for [facility] 08/09/21 patient was in permanent a. Fib - not a candidate for anticoagulation due to bleeding risk and was euvolemic. She has not required chronic diuretics. CXR suggests asymmetric pulmonary edema. Rales L.R on exam.  Plan Add BNP to labs  Give Lasix 40 mg IV x 1 and additional treatment based on lab results and follow-up x-ray.  Rheumatoid arthritis [facility]) Patient followed by Dr. Amil Amen with last OV 09/07/21 at which time she was stable.  Plan Continue home regimen including prednisone  Type 2 diabetes mellitus with hyperlipidemia (HCC) Last A1C 5.7% 05/21/21.   Plan Sliding scale coverage  Hypomagnesemia Patient on PO replacement.   Plan Continue 400 mg Mag bid  400 mg IV x  1  Gastroesophageal reflux disease Continue PPI at stress doses.  Hypertension BP stable. Continue diltiazem and metoprolol       DVT prophylaxis: Lovenox Code Status: DNR/DNI(Do NOT Intubate) Family Communication: attempted to call son Orene Desanctis - no answer on cell or at work  Disposition Plan: SNF when stable  Consults called: NS- Dr. Trenton Gammon; Cardiology consult requested-routine  Admission status: Inpatient, Telemetry bed   Adella Hare, MD [facility] 10/29/2021, 2:25 PM

## 2021-10-09 ENCOUNTER — Inpatient Hospital Stay (HOSPITAL_COMMUNITY): Payer: 59

## 2021-10-09 DIAGNOSIS — K6812 Psoas muscle abscess: Secondary | ICD-10-CM | POA: Diagnosis not present

## 2021-10-09 DIAGNOSIS — M4640 Discitis, unspecified, site unspecified: Secondary | ICD-10-CM | POA: Diagnosis not present

## 2021-10-09 DIAGNOSIS — I5032 Chronic diastolic (congestive) heart failure: Secondary | ICD-10-CM

## 2021-10-09 DIAGNOSIS — I4891 Unspecified atrial fibrillation: Secondary | ICD-10-CM | POA: Diagnosis not present

## 2021-10-09 LAB — CBC
HCT: 32.5 % — ABNORMAL LOW (ref 36.0–46.0)
Hemoglobin: 10.2 g/dL — ABNORMAL LOW (ref 12.0–15.0)
MCH: 29.3 pg (ref 26.0–34.0)
MCHC: 31.4 g/dL (ref 30.0–36.0)
MCV: 93.4 fL (ref 80.0–100.0)
Platelets: 331 10*3/uL (ref 150–400)
RBC: 3.48 MIL/uL — ABNORMAL LOW (ref 3.87–5.11)
RDW: 16.2 % — ABNORMAL HIGH (ref 11.5–15.5)
WBC: 10.7 10*3/uL — ABNORMAL HIGH (ref 4.0–10.5)
nRBC: 0 % (ref 0.0–0.2)

## 2021-10-09 LAB — BASIC METABOLIC PANEL
Anion gap: 13 (ref 5–15)
BUN: 25 mg/dL — ABNORMAL HIGH (ref 8–23)
CO2: 23 mmol/L (ref 22–32)
Calcium: 9 mg/dL (ref 8.9–10.3)
Chloride: 104 mmol/L (ref 98–111)
Creatinine, Ser: 1.01 mg/dL — ABNORMAL HIGH (ref 0.44–1.00)
GFR, Estimated: 58 mL/min — ABNORMAL LOW (ref >60)
Glucose, Bld: 127 mg/dL — ABNORMAL HIGH (ref 70–99)
Potassium: 3.9 mmol/L (ref 3.5–5.1)
Sodium: 140 mmol/L (ref 135–145)

## 2021-10-09 LAB — URINE CULTURE: Culture: 10000 — AB

## 2021-10-09 LAB — LACTIC ACID, PLASMA: Lactic Acid, Venous: 1.8 mmol/L (ref 0.5–1.9)

## 2021-10-09 LAB — MAGNESIUM: Magnesium: 1.9 mg/dL (ref 1.7–2.4)

## 2021-10-09 NOTE — Evaluation (Signed)
Physical Therapy Evaluation Patient Details Name: [name] MRN: [id number] DOB: [birth year] Today's Date: 10/09/2021  History of Present Illness  Pt is 76 yo female who presents to [facility] on 10/27/2021 with Afib with RVR which occurred during aspiration biopsy of lumbar lesion after MRI on 7/5 revealed L4-5 discitis/ osteomyelitis and R psoas abscess. PMH: RA, DM, CHF, permanent Afib with episodes of RVR when sick/ septic,, GI bleed, anemia, CKD2, CVA, dementia, multiple pressure wounds and foot ulcers.  Clinical Impression  Pt admitted with above diagnosis. Pt received in bed., Relays concern that she had been getting regular therapy at [facility] and working towards walking but lately has gotten little to none. Pt very motivated to get up to sit in her w/c. Pt able to roll in bed with min A. Needed max A for LE's off bed and initiation of sitting. Once she came partway up she experienced 10/10 LB and R hip pain and was unable to come to full upright. Took a break and tried again multiple times but each time pt was unable to tolerate pain to keep going. Pt tearful about this. Positioning in R SL in bed with pain back to tolerable level and RN present.  Pt currently with functional limitations due to the deficits listed below (see PT Problem List). Pt will benefit from skilled PT to increase their independence and safety with mobility to allow discharge to the venue listed below.          Recommendations for follow up therapy are one component of a multi-disciplinary discharge planning process, led by the attending physician.  Recommendations may be updated based on patient status, additional functional criteria and insurance authorization.  Follow Up Recommendations Skilled nursing-short term rehab (<3 hours/day) Can patient physically be transported by private vehicle: No    Assistance Recommended at Discharge Frequent or constant Supervision/Assistance  Patient can return home with the  following  Two people to help with walking and/or transfers;Two people to help with bathing/dressing/bathroom;Assistance with cooking/housework;Direct supervision/assist for medications management;Direct supervision/assist for financial management;Assist for transportation;Help with stairs or ramp for entrance    Equipment Recommendations None recommended by PT  Recommendations for Other Services       Functional Status Assessment Patient has had a recent decline in their functional status and demonstrates the ability to make significant improvements in function in a reasonable and predictable amount of time.     Precautions / Restrictions Precautions Precautions: Fall Restrictions Weight Bearing Restrictions: No      Mobility  Bed Mobility Overal bed mobility: Needs Assistance Bed Mobility: Supine to Sit, Sit to Supine Rolling: Min assist   Supine to sit: Max assist Sit to supine: Max assist   General bed mobility comments: pt able to roll to each side with min A. Max A for elevation of trunk into partial sitting but R hip and back pain was too great to come all the way up. Took breaks and attempted several times without success.    Transfers                   General transfer comment: unable due to pain    Ambulation/Gait               General Gait Details: has been using w/c at [facility]  Stairs            Wheelchair Mobility    Modified Rankin (Stroke Patients Only)       Balance Overall balance  assessment: Needs assistance Sitting-balance support: Feet supported, Bilateral upper extremity supported Sitting balance-Leahy Scale: Poor Sitting balance - Comments: needed mod A to sit due to pain                                     Pertinent Vitals/Pain Pain Assessment Pain Assessment: 0-10 Pain Score: 10-Worst pain ever Pain Location: R hip and low back Pain Descriptors / Indicators: Sharp, Crying Pain Intervention(s): Limited  activity within patient's tolerance, Monitored during session    Home Living Family/patient expects to be discharged to:: Skilled nursing facility                   Additional Comments: wants to get back home with longterm boyfriend but has been at [facility] since CVA. Relays frustration that she was getting therapy regularly but has not been recently    Prior Function Prior Level of Function : Needs assist             Mobility Comments: has been unable to walk, assist with transfers to w/c ADLs Comments: assisted by LTC SNF staff     Hand Dominance   Dominant Hand: Right    Extremity/Trunk Assessment   Upper Extremity Assessment Upper Extremity Assessment: Generalized weakness    Lower Extremity Assessment Lower Extremity Assessment: Generalized weakness    Cervical / Trunk Assessment Cervical / Trunk Assessment: Kyphotic  Communication   Communication: No difficulties  Cognition Arousal/Alertness: Awake/alert Behavior During Therapy: WFL for tasks assessed/performed Overall Cognitive Status: History of cognitive impairments - at baseline                                 General Comments: follows commands        General Comments General comments (skin integrity, edema, etc.): pt motiavted to try to stand and wanted to get to her w/c but could not tolerate pain. Pt also relays that her teeth, glasses, and hearing aids were lost here during last admission and have not been found    Exercises     Assessment/Plan    PT Assessment Patient needs continued PT services  PT Problem List Decreased strength;Decreased activity tolerance;Decreased balance;Decreased mobility;Decreased cognition;Pain;Decreased skin integrity       PT Treatment Interventions DME instruction;Functional mobility training;Therapeutic activities;Therapeutic exercise;Gait training;Patient/family education;Balance training    PT Goals (Current goals can be found in the Care Plan  section)  Acute Rehab PT Goals Patient Stated Goal: walk again PT Goal Formulation: With patient Time For Goal Achievement: 10/23/21 Potential to Achieve Goals: Fair    Frequency Min 2X/week     Co-evaluation               AM-PAC PT "6 Clicks" Mobility  Outcome Measure Help needed turning from your back to your side while in a flat bed without using bedrails?: A Little Help needed moving from lying on your back to sitting on the side of a flat bed without using bedrails?: Total Help needed moving to and from a bed to a chair (including a wheelchair)?: Total Help needed standing up from a chair using your arms (e.g., wheelchair or bedside chair)?: Total Help needed to walk in hospital room?: Total Help needed climbing 3-5 steps with a railing? : Total 6 Click Score: 8    End of Session   Activity Tolerance: Patient limited  by pain Patient left: in bed;with call bell/phone within reach;with bed alarm set Nurse Communication: Mobility status PT Visit Diagnosis: Pain;Muscle weakness (generalized) (M62.81);Difficulty in walking, not elsewhere classified (R26.2) Pain - Right/Left: Right Pain - part of body: Hip    Time: 0159-9689 PT Time Calculation (min) (ACUTE ONLY): 37 min   Charges:   PT Evaluation $PT Eval Moderate Complexity: 1 Mod PT Treatments $Therapeutic Activity: 8-22 mins        Leighton Roach, PT  Acute Rehab Services Secure chat preferred Office [place] 10/09/2021, 12:03 PM

## 2021-10-09 NOTE — Progress Notes (Signed)
Pt's IV fluids was scheduled to be discontinue at 0100 and MD on-call Dr. Marlowe Sax was contacted for clarification if to dc fluids since pt's lactic acid was 2.3. MD notified RN to discontinue fluid. Pt's fluid stopped. Will continue to closely monitor. Delia Heady RN

## 2021-10-09 NOTE — Progress Notes (Signed)
[facility] PROGRESS NOTE   [name] EXB:[id number] DOB: [birth year] DOA: 10/07/2021  PCP: Aletha Halim., PA-C  Brief History/Interval Summary: 76 y/o, with multiple co-morbidities including RA, DM, HFpEF, permanent a. Fib with episodes of RVR when sick, not a candidate for anticoagulation 2/2 h/o GI bleed from AVM, chronic blood loss anemia, CKD II, h/o CVA, Dementia, multiple pressure wounds and diabetic foot ulcers has had increasing back pain for several weeks. She is followed by Dr. Annette Stable with Neurosurgery. She had an MRI lumbar 10/06/21 revealing L4-5 discitis/osteomyelitis and a right psoas abscess. She was in IR dept for aspiration bx of lumbar lesion when she experienced atrial fibrillation with RVR.  Transferred to the emergency department.  The procedure was canceled.  She was subsequently hospitalized.    Consultants: Neurosurgery.  Interventional radiology.  Procedures: None yet    Subjective/Interval History: Patient mentions that back pain has improved but still there in the lower back at about 4 out of 10 in intensity.  Denies any chest pain or shortness of breath.  No palpitations.  No dizziness or lightheadedness.    Assessment/Plan:  Lumbar discitis/psoas abscess Noted on recent MRI.  Seen by neurosurgery.  Plan is for disc aspiration.  This was attempted yesterday by interventional radiology but she went into atrial fibrillation with RVR so the procedure had to be canceled. Hopefully this can be done early next week. Noted to be on vancomycin cefepime and metronidazole.  Will eventually need to be seen by infectious disease as well. She apparently has had previous episodes of psoas abscess as well.  Atrial fibrillation with RVR Patient has a history of permanent atrial fibrillation.  She apparently develops episodes of RVR whenever she is ill. Currently on IV diltiazem.  Heart rate seems to be stable.  She has also been started back on her oral  Cardizem and metoprolol. Wean down the infusion and to keep heart rate less than 110. Not on anticoagulation due to previous history of significant GI bleed.  Chronic diastolic CHF Seems to be fairly euvolemic.  It appears that she was given furosemide x1 in the emergency department due to findings suggestive of CHF on chest x-ray..  Not on diuretics on a chronic basis.  Continue to monitor volume status. Chest x-ray from this morning shows improvement.  Rheumatoid arthritis Followed by rheumatology.  Noted to be on prednisone on a chronic basis which is being continued.  Also noted to be on [place].  Diabetes mellitus type 2 with hyperlipidemia HbA1c of 5.7 in February.  Continue SSI.  Normocytic anemia Likely anemia of chronic disease.  No evidence of overt blood loss currently.  Dementia Seems to be stable.  Has had delirium and agitation during previous hospitalization.  Seems to be appropriate at this time.  Hypomagnesemia Corrected.  Right buttock decubitus stage III Pressure Injury 10/20/2021 Buttocks Right;Upper Stage 3 -  Full thickness tissue loss. Subcutaneous fat may be visible but bone, tendon or muscle are NOT exposed. (Active)  10/09/2021   Location: Buttocks  Location Orientation: Right;Upper  Staging: Stage 3 -  Full thickness tissue loss. Subcutaneous fat may be visible but bone, tendon or muscle are NOT exposed.  Wound Description (Comments):   Present on Admission: Yes    DVT Prophylaxis: Lovenox Code Status: DNR Family Communication: No family at bedside.  Discussed with patient Disposition Plan: Apparently lives at a skilled nursing facility, [facility].  Anticipate discharge back to the facility when medically stable.  Status  is: Inpatient Remains inpatient appropriate because: Discitis    Medications: Scheduled:  (feeding supplement) PROSource Plus  30 mL Oral Daily   acetaminophen  975 mg Oral TID   buPROPion ER  100 mg Oral BID   cycloSPORINE  1  drop Both Eyes BID   diltiazem  360 mg Oral Daily   enoxaparin (LOVENOX) injection  40 mg Subcutaneous Q24H   ferrous sulfate  325 mg Oral BID WC   gabapentin  100 mg Oral BID   insulin aspart  0-20 Units Subcutaneous TID WC   ketotifen  1 drop Both Eyes BID   leflunomide  20 mg Oral Daily   magnesium oxide  400 mg Oral BID   metoprolol tartrate  50 mg Oral BID   mometasone-formoterol  2 puff Inhalation BID   oxyCODONE  7.5 mg Oral Q6H   pantoprazole  40 mg Oral BID   polyvinyl alcohol  2 drop Both Eyes Daily   predniSONE  5 mg Oral Daily   saccharomyces boulardii  250 mg Oral Daily   Continuous:  ceFEPime (MAXIPIME) IV Stopped (10/31/2021 2359)   diltiazem (CARDIZEM) infusion 5 mg/hr (10/09/21 0700)   metronidazole Stopped (10/09/21 0200)   vancomycin 750 mg (10/09/21 1150)   VZD:GLOVFIEPPIRJJ, albuterol, Gerhardt's butt cream, HYDROmorphone (DILAUDID) injection, nitroGLYCERIN, ondansetron (ZOFRAN) IV, mouth rinse  Antibiotics: Anti-infectives (From admission, onward)    Start     Dose/Rate Route Frequency Ordered Stop   10/09/21 1100  vancomycin (VANCOREADY) IVPB 750 mg/150 mL        750 mg 150 mL/hr over 60 Minutes Intravenous Every 24 hours 10/29/2021 1351     11/01/2021 2300  ceFEPIme (MAXIPIME) 2 g in sodium chloride 0.9 % 100 mL IVPB        2 g 200 mL/hr over 30 Minutes Intravenous Every 12 hours 10/25/2021 1305     10/25/2021 2300  metroNIDAZOLE (FLAGYL) IVPB 500 mg        500 mg 100 mL/hr over 60 Minutes Intravenous Every 12 hours 10/21/2021 1305     10/27/2021 1100  vancomycin (VANCOREADY) IVPB 1500 mg/300 mL        1,500 mg 150 mL/hr over 120 Minutes Intravenous  Once 10/12/2021 1046 10/03/2021 1345   10/03/2021 1045  ceFEPIme (MAXIPIME) 2 g in sodium chloride 0.9 % 100 mL IVPB        2 g 200 mL/hr over 30 Minutes Intravenous  Once 10/17/2021 1043 10/04/2021 1132   10/20/2021 1045  metroNIDAZOLE (FLAGYL) IVPB 500 mg        500 mg 100 mL/hr over 60 Minutes Intravenous  Once 10/10/2021  1043 10/25/2021 1226   10/04/2021 1045  vancomycin (VANCOCIN) IVPB 1000 mg/200 mL premix  Status:  Discontinued        1,000 mg 200 mL/hr over 60 Minutes Intravenous  Once 10/20/2021 1043 11/01/2021 1046       Objective:  Vital Signs  Vitals:   10/09/21 0747 10/09/21 0823 10/09/21 0940 10/09/21 1118  BP: (!) 136/96  (!) 154/89 103/70  Pulse: 95  (!) 113 94  Resp: '20  20 18  '$ Temp: 98.3 F (36.8 C)  97.6 F (36.4 C) 98.3 F (36.8 C)  TempSrc: Oral   Oral  SpO2: 98% 95%  98%  Weight:      Height:        Intake/Output Summary (Last 24 hours) at 10/09/2021 1151 Last data filed at 10/09/2021 0800 Gross per 24 hour  Intake 1029.82 ml  Output  1600 ml  Net -570.18 ml   Filed Weights   10/05/2021 1649 10/09/21 0400  Weight: 62.2 kg 66.2 kg    General appearance: Awake alert.  In no distress Resp: Normal effort at rest.  Few crackles at the bases.  No wheezing or rhonchi. Cardio: S1-S2 is normal regular.  No S3-S4.  No rubs murmurs or bruit GI: Abdomen is soft.  Nontender nondistended.  Bowel sounds are present normal.  No masses organomegaly Extremities: No edema.  Able to move all 4 extremities. Neurologic:   No focal neurological deficits.    Lab Results:  Data Reviewed: I have personally reviewed following labs and reports of the imaging studies  CBC: Recent Labs  Lab 10/17/2021 1054 10/09/21 0142  WBC 13.1* 10.7*  NEUTROABS 10.2*  --   HGB 11.1* 10.2*  HCT 35.7* 32.5*  MCV 94.2 93.4  PLT 347 347    Basic Metabolic Panel: Recent Labs  Lab 10/27/2021 1054 10/09/21 0142  NA 139 140  K 4.1 3.9  CL 107 104  CO2 20* 23  GLUCOSE 135* 127*  BUN 25* 25*  CREATININE 0.97 1.01*  CALCIUM 9.3 9.0  MG 1.6* 1.9    GFR: Estimated Creatinine Clearance: 46.1 mL/min (A) (by C-G formula based on SCr of 1.01 mg/dL (H)).  Liver Function Tests: Recent Labs  Lab 10/23/2021 1054  AST 19  ALT 21  ALKPHOS 152*  BILITOT 0.7  PROT 6.6  ALBUMIN 2.5*     Coagulation  Profile: Recent Labs  Lab 10/07/2021 1004 10/09/2021 1054  INR 1.4* 1.2     HbA1C: Recent Labs    10/13/2021 1054  HGBA1C 5.6    CBG: Recent Labs  Lab 10/16/2021 0840 10/12/2021 1656  GLUCAP 163* 160*    Lipid Profile: No results for input(s): "CHOL", "HDL", "LDLCALC", "TRIG", "CHOLHDL", "LDLDIRECT" in the last 72 hours.  Thyroid Function Tests: Recent Labs    10/14/2021 1054  TSH 1.192    Anemia Panel: No results for input(s): "VITAMINB12", "FOLATE", "FERRITIN", "TIBC", "IRON", "RETICCTPCT" in the last 72 hours.  Recent Results (from the past 240 hour(s))  Resp Panel by RT-PCR (Flu A&B, Covid) Anterior Nasal Swab     Status: None   Collection Time: 10/26/2021 10:42 AM   Specimen: Anterior Nasal Swab  Result Value Ref Range Status   SARS Coronavirus 2 by RT PCR NEGATIVE NEGATIVE Final    Comment: (NOTE) SARS-CoV-2 target nucleic acids are NOT DETECTED.  The SARS-CoV-2 RNA is generally detectable in upper respiratory specimens during the acute phase of infection. The lowest concentration of SARS-CoV-2 viral copies this assay can detect is 138 copies/mL. A negative result does not preclude SARS-Cov-2 infection and should not be used as the sole basis for treatment or other patient management decisions. A negative result may occur with  improper specimen collection/handling, submission of specimen other than nasopharyngeal swab, presence of viral mutation(s) within the areas targeted by this assay, and inadequate number of viral copies(<138 copies/mL). A negative result must be combined with clinical observations, patient history, and epidemiological information. The expected result is Negative.  Fact Sheet for Patients:  EntrepreneurPulse.com.au  Fact Sheet for Healthcare Providers:  IncredibleEmployment.be  This test is no t yet approved or cleared by the [place] FDA and  has been authorized for detection and/or diagnosis of  SARS-CoV-2 by FDA under an Emergency Use Authorization (EUA). This EUA will remain  in effect (meaning this test can be used) for the duration of the COVID-19  declaration under Section 564(b)(1) of the Act, 21 U.S.C.section 360bbb-3(b)(1), unless the authorization is terminated  or revoked sooner.       Influenza A by PCR NEGATIVE NEGATIVE Final   Influenza B by PCR NEGATIVE NEGATIVE Final    Comment: (NOTE) The Xpert Xpress SARS-CoV-2/FLU/RSV plus assay is intended as an aid in the diagnosis of influenza from Nasopharyngeal swab specimens and should not be used as a sole basis for treatment. Nasal washings and aspirates are unacceptable for Xpert Xpress SARS-CoV-2/FLU/RSV testing.  Fact Sheet for Patients: EntrepreneurPulse.com.au  Fact Sheet for Healthcare Providers: IncredibleEmployment.be  This test is not yet approved or cleared by the [place] FDA and has been authorized for detection and/or diagnosis of SARS-CoV-2 by FDA under an Emergency Use Authorization (EUA). This EUA will remain in effect (meaning this test can be used) for the duration of the COVID-19 declaration under Section 564(b)(1) of the Act, 21 U.S.C. section 360bbb-3(b)(1), unless the authorization is terminated or revoked.  Performed at [facility], [place] [address] [place], [place] [address]   Blood Culture (routine x 2)     Status: None (Preliminary result)   Collection Time: 10/07/2021 10:54 AM   Specimen: BLOOD RIGHT ARM  Result Value Ref Range Status   Specimen Description BLOOD RIGHT ARM  Final   Special Requests   Final    BOTTLES DRAWN AEROBIC AND ANAEROBIC Blood Culture adequate volume   Culture   Final    NO GROWTH < 24 HOURS Performed at [facility], [place] [address] [place], [place] [address]    Report Status PENDING  Incomplete  Blood Culture (routine x 2)     Status: None (Preliminary result)   Collection Time: 10/31/2021 11:00 AM    Specimen: BLOOD LEFT ARM  Result Value Ref Range Status   Specimen Description BLOOD LEFT ARM  Final   Special Requests   Final    BOTTLES DRAWN AEROBIC ONLY Blood Culture results may not be optimal due to an inadequate volume of blood received in culture bottles   Culture   Final    NO GROWTH < 24 HOURS Performed at [facility], [place] [address], [place], [place] [address]    Report Status PENDING  Incomplete      Radiology Studies: DG CHEST PORT 1 VIEW  Result Date: 10/09/2021 CLINICAL DATA:  Congestive heart failure EXAM: PORTABLE CHEST 1 VIEW COMPARISON:  10/22/2021 FINDINGS: Chronic cardiomegaly. Stable mediastinal contours with atheromatous calcification. Small pleural effusions and mild generalized interstitial coarsening. IMPRESSION: CHF pattern which is similar to prior. Electronically Signed   By: Jorje Guild M.D.   On: 10/09/2021 07:52   DG Chest Port 1 View  Result Date: 10/22/2021 CLINICAL DATA:  Atrial fibrillation.  Discitis-osteomyelitis. EXAM: PORTABLE CHEST 1 VIEW COMPARISON:  08/02/2021 FINDINGS: Defibrillator patches noted. Cephalization of blood flow with pulmonary vascular indistinctness and left greater than right interstitial accentuation. Fine Kerley B lines noted. Upper normal heart size. No substantial blunting of the costophrenic angles. Thoracic spondylosis.  Old healed left lateral rib deformities. IMPRESSION: 1. Asymmetric interstitial accentuation and vascular indistinctness, left greater than right, compatible with asymmetric pulmonary edema or possibly early aspiration pneumonitis on the left. Upper normal heart size. Electronically Signed   By: Van Clines M.D.   On: 10/20/2021 11:46       LOS: 1 day   [facility] Pager on [email or web address]  10/09/2021, 11:51 AM

## 2021-10-09 NOTE — Progress Notes (Signed)
Patient admitted to the hospital yesterday after developing RVR with A-fib prior to undergoing L4-5 disc space aspiration for evaluation of recently discovered spontaneous L4-5 osteomyelitis discitis.  Disc base aspiration has been delayed till probably early in the week.  She has been started on broad-spectrum antibiotics.  She reports that her back pain is better.  She is not having any radiating pain into her lower extremities.  She is having no numbness or paresthesias.  Her heart rate has returned to normal.  I would recommend continued antibiotic coverage.  Patient may be mobilized ad lib.  She may benefit from a lumbar corset for pain control.  She requires no neurosurgical intervention at this time.

## 2021-10-10 DIAGNOSIS — I4891 Unspecified atrial fibrillation: Secondary | ICD-10-CM | POA: Diagnosis not present

## 2021-10-10 DIAGNOSIS — M4640 Discitis, unspecified, site unspecified: Secondary | ICD-10-CM | POA: Diagnosis not present

## 2021-10-10 DIAGNOSIS — K6812 Psoas muscle abscess: Secondary | ICD-10-CM | POA: Diagnosis not present

## 2021-10-10 DIAGNOSIS — I5032 Chronic diastolic (congestive) heart failure: Secondary | ICD-10-CM | POA: Diagnosis not present

## 2021-10-10 LAB — CBC
HCT: 35.9 % — ABNORMAL LOW (ref 36.0–46.0)
Hemoglobin: 11.4 g/dL — ABNORMAL LOW (ref 12.0–15.0)
MCH: 29.2 pg (ref 26.0–34.0)
MCHC: 31.8 g/dL (ref 30.0–36.0)
MCV: 91.8 fL (ref 80.0–100.0)
Platelets: 376 10*3/uL (ref 150–400)
RBC: 3.91 MIL/uL (ref 3.87–5.11)
RDW: 16 % — ABNORMAL HIGH (ref 11.5–15.5)
WBC: 10.2 10*3/uL (ref 4.0–10.5)
nRBC: 0 % (ref 0.0–0.2)

## 2021-10-10 LAB — GLUCOSE, CAPILLARY
Glucose-Capillary: 121 mg/dL — ABNORMAL HIGH (ref 70–99)
Glucose-Capillary: 138 mg/dL — ABNORMAL HIGH (ref 70–99)
Glucose-Capillary: 168 mg/dL — ABNORMAL HIGH (ref 70–99)
Glucose-Capillary: 94 mg/dL (ref 70–99)
Glucose-Capillary: 104 mg/dL — ABNORMAL HIGH (ref 70–99)
Glucose-Capillary: 130 mg/dL — ABNORMAL HIGH (ref 70–99)
Glucose-Capillary: 139 mg/dL — ABNORMAL HIGH (ref 70–99)

## 2021-10-10 LAB — BASIC METABOLIC PANEL
Anion gap: 12 (ref 5–15)
BUN: 21 mg/dL (ref 8–23)
CO2: 22 mmol/L (ref 22–32)
Calcium: 9.3 mg/dL (ref 8.9–10.3)
Chloride: 104 mmol/L (ref 98–111)
Creatinine, Ser: 0.89 mg/dL (ref 0.44–1.00)
GFR, Estimated: >60 mL/min (ref >60)
Glucose, Bld: 94 mg/dL (ref 70–99)
Potassium: 3.5 mmol/L (ref 3.5–5.1)
Sodium: 138 mmol/L (ref 135–145)

## 2021-10-10 MED ORDER — VANCOMYCIN HCL 500 MG/100ML IV SOLN
500.0000 mg | Freq: Once | INTRAVENOUS | Status: AC
  Administered 2021-10-10: 500 mg via INTRAVENOUS
  Filled 2021-10-10: qty 100

## 2021-10-10 MED ORDER — METOPROLOL TARTRATE 100 MG PO TABS
100.0000 mg | ORAL_TABLET | Freq: Two times a day (BID) | ORAL | Status: DC
  Administered 2021-10-10 – 2021-10-14 (×8): 100 mg via ORAL
  Filled 2021-10-10 (×8): qty 1

## 2021-10-10 MED ORDER — HALOPERIDOL LACTATE 5 MG/ML IJ SOLN
0.5000 mg | Freq: Four times a day (QID) | INTRAMUSCULAR | Status: DC | PRN
  Administered 2021-10-11 – 2021-10-13 (×4): 1 mg via INTRAVENOUS
  Filled 2021-10-10 (×4): qty 1

## 2021-10-10 MED ORDER — VANCOMYCIN HCL 1250 MG/250ML IV SOLN
1250.0000 mg | INTRAVENOUS | Status: DC
Start: 2021-10-11 — End: 2021-10-15
  Administered 2021-10-11 – 2021-10-14 (×4): 1250 mg via INTRAVENOUS
  Filled 2021-10-10 (×5): qty 250

## 2021-10-10 MED ORDER — POTASSIUM CHLORIDE CRYS ER 20 MEQ PO TBCR
40.0000 meq | EXTENDED_RELEASE_TABLET | Freq: Once | ORAL | Status: AC
  Administered 2021-10-10: 40 meq via ORAL
  Filled 2021-10-10: qty 2

## 2021-10-10 MED ORDER — METOPROLOL TARTRATE 50 MG PO TABS
50.0000 mg | ORAL_TABLET | Freq: Once | ORAL | Status: AC
Start: 2021-10-10 — End: 2021-10-10
  Administered 2021-10-10: 50 mg via ORAL
  Filled 2021-10-10: qty 1

## 2021-10-10 NOTE — TOC Initial Note (Signed)
Transition of Care [facility]) - Initial/Assessment Note    Patient Details  Name: [name] MRN: [id number] Date of Birth: Nov 22, 1945  Transition of Care [facility]) CM/SW Contact:    Joanne Chars, LCSW Phone Number: 10/10/2021, 10:26 AM  Clinical Narrative:       CSW met with pt regarding DC recommendation for SNF.  Pt is from [facility], appears to be long term care, recommended for rehab currently.  Pt agreeable to return to [place] but states she still hopes to get home eventually.  Permission given to speak with son Fara Olden.    CSW unable to reach son, cell number not working.  Referral sent to [facility]            Expected Discharge Plan: [place] Barriers to Discharge: Continued Medical Work up, SNF Pending bed offer   Patient Goals and CMS Choice Patient states their goals for this hospitalization and ongoing recovery are:: "go home"   Choice offered to / list presented to : Patient  Expected Discharge Plan and Services Expected Discharge Plan: [place] In-house Referral: Clinical Social Work   Post Acute Care Choice: [place] Living arrangements for the past 2 months: [place]                                      Prior Living Arrangements/Services Living arrangements for the past 2 months: [place] Lives with:: Facility Resident Patient language and need for interpreter reviewed:: Yes Do you feel safe going back to the place where you live?: Yes      Need for Family Participation in Patient Care: Yes (Comment) Care giver support system in place?: Yes (comment) Current home services: Other (comment) (na) Criminal Activity/Legal Involvement Pertinent to Current Situation/Hospitalization: No - Comment as needed  Activities of Daily Living      Permission Sought/Granted Permission sought to share information with : Family Supports Permission granted to share  information with : Yes, Verbal Permission Granted  Share Information with NAME: son Joes  Permission granted to share info w AGENCY: SNF        Emotional Assessment Appearance:: Appears stated age Attitude/Demeanor/Rapport: Engaged Affect (typically observed): Sad, Tearful/Crying Orientation: : Oriented to Self, Oriented to Place, Oriented to  Time, Oriented to Situation Alcohol / Substance Use: Not Applicable Psych Involvement: No (comment)  Admission diagnosis:  Atrial fibrillation with RVR (HCC) [I48.91] Discitis, unspecified spinal region [M46.40] Acute back pain, unspecified back location, unspecified back pain laterality [M54.9] Sepsis, due to unspecified organism, unspecified whether acute organ dysfunction present [facility]) [A41.9] Patient Active Problem List   Diagnosis Date Noted   Septic discitis of lumbar region 10/27/2021   Psoas abscess, right ([place]) 10/12/2021   Heartburn 10/04/2021   Hypothermia 08/03/2021   SIRS (systemic inflammatory response syndrome) (HCC)    Dementia with agitation ([place])    Protein-calorie malnutrition, severe 06/19/2021   Encephalopathy    Contusion of scalp    Fever    Delirium    Agitation    Bacteremia due to Proteus species    Cellulitis of left lower extremity    Hypokalemia 01/65/5374   Acute metabolic encephalopathy 82/70/7867   Pressure injury of skin 05/21/2021   Hypomagnesemia 03/30/2020   Hypophosphatemia 03/30/2020   Hyperglycemia    Iliopsoas abscess on left ([place]) 10/04/2017   HCAP (healthcare-associated pneumonia) 10/04/2017   Acute  lower UTI 10/04/2017   Hallucinations 07/25/2017   Abnormal LFTs 07/24/2017   Acute on chronic diastolic CHF (congestive heart failure) (HCC)    Elevated LFTs    AKI (acute kidney injury) ([place])    Elevated troponin    Stroke, acute, embolic ([place]) 71/09/2692   Mitral regurgitation 06/17/2017   Thyroid nodule 06/15/2017   Atrial fibrillation with RVR ([place]) 06/14/2017   CVA (cerebral  vascular accident) ([place]) 06/14/2017   Peripheral vascular disease ([place])    Hiatal hernia    Chronic diastolic (congestive) heart failure (HCC)    Carpal tunnel syndrome    A-fib (HCC)    Chronic anemia    Actinic keratosis    Acute CHF (congestive heart failure) ([place]) 05/20/2016   History of GI bleed 03/29/2016   Chronic anticoagulation 03/29/2016   Symptomatic anemia 03/19/2016   Spinal stenosis of lumbar region 02/10/2016   Varicose veins of left leg with edema 09/15/2015   Iron deficiency anemia due to chronic blood loss 07/17/2015   Anemia of chronic renal failure, stage 3 (moderate) ([place]) 07/17/2015   Anticoagulant causing adverse effect in therapeutic use 07/17/2015   Symptomatic bradycardia 02/21/2015   Bradycardia    Abdominal wall mass of right lower quadrant 07/08/2013   OA (osteoarthritis) of hip 04/03/2013   Osteoarthritis of hip 01/18/2013   Night sweats 09/06/2012   Chest pain    Anxiety and depression    Gastroesophageal reflux disease    Hepatitis    Jaundice    Hypertension 07/01/2010   Type 2 diabetes mellitus with hyperlipidemia ([place]) 07/01/2010   Rheumatoid arthritis ([place]) 07/01/2010   Sinus tachycardia 07/01/2010   Chest pain, atypical 07/01/2010   PCP:  Aletha Halim., PA-C Pharmacy:   [facility] [place], [place] [address] Phone: [phone] Fax: [phone]     Social Determinants of Health (SDOH) Interventions    Readmission Risk Interventions    08/03/2021   11:07 AM 07/26/2021    2:12 PM 06/29/2021   11:03 AM  Readmission Risk Prevention Plan  Transportation Screening Complete Complete Complete  Medication Review (RN Care Manager) Complete  Complete  HRI or Home Care Consult Complete Complete Complete  SW Recovery Care/Counseling Consult Complete Complete Complete  Palliative Care Screening Not Applicable  Complete  Skilled Nursing Facility Complete Complete  Complete

## 2021-10-10 NOTE — Progress Notes (Signed)
Pt get agitated easily whenever I tried to give her medications. I explained why and how much insulin shot she is supposed to receive. She kept saying "Why" Got overly upset about getting insulin shot and taking other medications. She was suspicious of everything I do. She said that "What are you trying to do to me?"  I explained multiple times about insulin but she was still upset and screamed at me. When I ask if she need anything else, she got upset again. If you need anything later, call us back. She was screaming at me and said "you stay here" "You are my nurse, you should stay here."

## 2021-10-10 NOTE — Progress Notes (Signed)
[facility] PROGRESS NOTE   [name] AYT:[id number] DOB: [birth year] DOA: 10/27/2021  PCP: Aletha Halim., PA-C  Brief History/Interval Summary: 76 y/o, with multiple co-morbidities including RA, DM, HFpEF, permanent a. Fib with episodes of RVR when sick, not a candidate for anticoagulation 2/2 h/o GI bleed from AVM, chronic blood loss anemia, CKD II, h/o CVA, Dementia, multiple pressure wounds and diabetic foot ulcers has had increasing back pain for several weeks. She is followed by Dr. Annette Stable with Neurosurgery. She had an MRI lumbar 10/06/21 revealing L4-5 discitis/osteomyelitis and a right psoas abscess. She was in IR dept for aspiration bx of lumbar lesion when she experienced atrial fibrillation with RVR.  Transferred to the emergency department.  The procedure was canceled.  She was subsequently hospitalized.    Consultants: Neurosurgery.  Interventional radiology.  Procedures: None yet    Subjective/Interval History: Patient continues to have back pain for out of 10 in intensity.  Able to move her legs.  Denies any chest pain shortness of breath.  No nausea or vomiting.      Assessment/Plan:  Lumbar discitis/right psoas abscess Noted on recent MRI.  Seen by neurosurgery.  Plan is for disc aspiration.  This was attempted on 7/7 by interventional radiology but she went into atrial fibrillation with RVR so the procedure had to be canceled. Hopefully this can be done early next week. Noted to be on vancomycin cefepime and metronidazole.  Will eventually need to be seen by infectious disease as well. She apparently has had previous episodes of psoas abscess as well.  Atrial fibrillation with RVR Patient has a history of permanent atrial fibrillation.  She apparently develops episodes of RVR whenever she is ill. Patient is on her oral Cardizem and metoprolol.  Heart rate was controlled yesterday and looks like she was going to be weaned off of her intravenous diltiazem  but this morning she is again noted to be on 10 mg/h of diltiazem.  We will increase the dose of her metoprolol since blood pressure is noted to be elevated.  Continue current dose of oral diltiazem.  Try to wean down her intravenous diltiazem as possible today to maintain heart rate below 110. Not on anticoagulation due to previous history of significant GI bleed.  Chronic diastolic CHF Seems to be fairly euvolemic.  It appears that she was given furosemide x1 in the emergency department due to findings suggestive of CHF on chest x-ray..  Not on diuretics on a chronic basis.  Continue to monitor volume status. Repeat chest x-ray showed improvement.  She is saturating in the mid to late 90s on room air.  Continue to monitor off of diuretics.  Rheumatoid arthritis Followed by rheumatology.  Noted to be on prednisone on a chronic basis which is being continued.  Also noted to be on [place].  Diabetes mellitus type 2 with hyperlipidemia HbA1c of 5.7 in February.  Continue SSI.  Normocytic anemia Likely anemia of chronic disease.  No evidence of overt blood loss currently.  Dementia Seems to be stable.  Has had delirium and agitation during previous hospitalization.  Seems to be appropriate at this time.  Hypomagnesemia Corrected.  Right buttock decubitus stage III Pressure Injury 10/18/2021 Buttocks Right;Upper Stage 3 -  Full thickness tissue loss. Subcutaneous fat may be visible but bone, tendon or muscle are NOT exposed. (Active)  10/17/2021   Location: Buttocks  Location Orientation: Right;Upper  Staging: Stage 3 -  Full thickness tissue loss. Subcutaneous fat may be  visible but bone, tendon or muscle are NOT exposed.  Wound Description (Comments):   Present on Admission: Yes    DVT Prophylaxis: Lovenox Code Status: DNR Family Communication: No family at bedside.  Discussed with patient Disposition Plan: Apparently lives at a skilled nursing facility, [facility].  Anticipate  discharge back to the facility when medically stable.  Status is: Inpatient Remains inpatient appropriate because: Discitis    Medications: Scheduled:  (feeding supplement) PROSource Plus  30 mL Oral Daily   acetaminophen  975 mg Oral TID   buPROPion ER  100 mg Oral BID   cycloSPORINE  1 drop Both Eyes BID   diltiazem  360 mg Oral Daily   enoxaparin (LOVENOX) injection  40 mg Subcutaneous Q24H   ferrous sulfate  325 mg Oral BID WC   gabapentin  100 mg Oral BID   insulin aspart  0-20 Units Subcutaneous TID WC   ketotifen  1 drop Both Eyes BID   leflunomide  20 mg Oral Daily   magnesium oxide  400 mg Oral BID   metoprolol tartrate  100 mg Oral BID   mometasone-formoterol  2 puff Inhalation BID   oxyCODONE  7.5 mg Oral Q6H   pantoprazole  40 mg Oral BID   polyvinyl alcohol  2 drop Both Eyes Daily   predniSONE  5 mg Oral Daily   saccharomyces boulardii  250 mg Oral Daily   Continuous:  ceFEPime (MAXIPIME) IV 2 g (10/10/21 1031)   diltiazem (CARDIZEM) infusion 7.5 mg/hr (10/10/21 0507)   metronidazole 500 mg (10/10/21 1037)   vancomycin Stopped (10/09/21 1247)   AYT:KZSWFUXNATFTD, albuterol, Gerhardt's butt cream, HYDROmorphone (DILAUDID) injection, nitroGLYCERIN, ondansetron (ZOFRAN) IV, mouth rinse  Antibiotics: Anti-infectives (From admission, onward)    Start     Dose/Rate Route Frequency Ordered Stop   10/09/21 1100  vancomycin (VANCOREADY) IVPB 750 mg/150 mL        750 mg 150 mL/hr over 60 Minutes Intravenous Every 24 hours 10/02/2021 1351     10/31/2021 2300  ceFEPIme (MAXIPIME) 2 g in sodium chloride 0.9 % 100 mL IVPB        2 g 200 mL/hr over 30 Minutes Intravenous Every 12 hours 10/27/2021 1305     10/29/2021 2300  metroNIDAZOLE (FLAGYL) IVPB 500 mg        500 mg 100 mL/hr over 60 Minutes Intravenous Every 12 hours 10/18/2021 1305     10/05/2021 1100  vancomycin (VANCOREADY) IVPB 1500 mg/300 mL        1,500 mg 150 mL/hr over 120 Minutes Intravenous  Once 10/21/2021 1046  10/18/2021 1345   10/27/2021 1045  ceFEPIme (MAXIPIME) 2 g in sodium chloride 0.9 % 100 mL IVPB        2 g 200 mL/hr over 30 Minutes Intravenous  Once 10/26/2021 1043 10/09/2021 1132   10/07/2021 1045  metroNIDAZOLE (FLAGYL) IVPB 500 mg        500 mg 100 mL/hr over 60 Minutes Intravenous  Once 10/07/2021 1043 10/04/2021 1226   10/25/2021 1045  vancomycin (VANCOCIN) IVPB 1000 mg/200 mL premix  Status:  Discontinued        1,000 mg 200 mL/hr over 60 Minutes Intravenous  Once 10/11/2021 1043 10/05/2021 1046       Objective:  Vital Signs  Vitals:   10/10/21 0724 10/10/21 0733 10/10/21 0900 10/10/21 0922  BP: (!) 153/100 (!) 155/101 (!) 165/106   Pulse: 83  (!) 108   Resp: 14     Temp: 97.9  F (36.6 C) 98.4 F (36.9 C)    TempSrc: Oral Oral    SpO2: 100% 96%  97%  Weight:      Height:        Intake/Output Summary (Last 24 hours) at 10/10/2021 1104 Last data filed at 10/10/2021 0950 Gross per 24 hour  Intake 951.45 ml  Output 2300 ml  Net -1348.55 ml    Filed Weights   10/05/2021 1649 10/09/21 0400 10/10/21 0335  Weight: 62.2 kg 66.2 kg 69.6 kg    General appearance: Awake alert.  In no distress Resp: Clear to auscultation bilaterally.  Normal effort Cardio: S1-S2 is irregularly irregular, tachycardic. GI: Abdomen is soft.  Nontender nondistended.  Bowel sounds are present normal.  No masses organomegaly Extremities: No edema.  Full range of motion of lower extremities. Neurologic No focal neurological deficits.     Lab Results:  Data Reviewed: I have personally reviewed following labs and reports of the imaging studies  CBC: Recent Labs  Lab 10/27/2021 1054 10/09/21 0142 10/10/21 0910  WBC 13.1* 10.7* 10.2  NEUTROABS 10.2*  --   --   HGB 11.1* 10.2* 11.4*  HCT 35.7* 32.5* 35.9*  MCV 94.2 93.4 91.8  PLT 347 331 376     Basic Metabolic Panel: Recent Labs  Lab 10/22/2021 1054 10/09/21 0142  NA 139 140  K 4.1 3.9  CL 107 104  CO2 20* 23  GLUCOSE 135* 127*  BUN 25* 25*   CREATININE 0.97 1.01*  CALCIUM 9.3 9.0  MG 1.6* 1.9     GFR: Estimated Creatinine Clearance: 46.1 mL/min (A) (by C-G formula based on SCr of 1.01 mg/dL (H)).  Liver Function Tests: Recent Labs  Lab 10/22/2021 1054  AST 19  ALT 21  ALKPHOS 152*  BILITOT 0.7  PROT 6.6  ALBUMIN 2.5*      Coagulation Profile: Recent Labs  Lab 10/10/2021 1004 10/02/2021 1054  INR 1.4* 1.2      HbA1C: Recent Labs    10/15/2021 1054  HGBA1C 5.6     CBG: Recent Labs  Lab 10/06/2021 0840 10/16/2021 1656  GLUCAP 163* 160*     Thyroid Function Tests: Recent Labs    10/26/2021 1054  TSH 1.192       Recent Results (from the past 240 hour(s))  Resp Panel by RT-PCR (Flu A&B, Covid) Anterior Nasal Swab     Status: None   Collection Time: 10/17/2021 10:42 AM   Specimen: Anterior Nasal Swab  Result Value Ref Range Status   SARS Coronavirus 2 by RT PCR NEGATIVE NEGATIVE Final    Comment: (NOTE) SARS-CoV-2 target nucleic acids are NOT DETECTED.  The SARS-CoV-2 RNA is generally detectable in upper respiratory specimens during the acute phase of infection. The lowest concentration of SARS-CoV-2 viral copies this assay can detect is 138 copies/mL. A negative result does not preclude SARS-Cov-2 infection and should not be used as the sole basis for treatment or other patient management decisions. A negative result may occur with  improper specimen collection/handling, submission of specimen other than nasopharyngeal swab, presence of viral mutation(s) within the areas targeted by this assay, and inadequate number of viral copies(<138 copies/mL). A negative result must be combined with clinical observations, patient history, and epidemiological information. The expected result is Negative.  Fact Sheet for Patients:  EntrepreneurPulse.com.au  Fact Sheet for Healthcare Providers:  IncredibleEmployment.be  This test is no t yet approved or cleared by  the [place] and  has been authorized for  detection and/or diagnosis of SARS-CoV-2 by FDA under an Emergency Use Authorization (EUA). This EUA will remain  in effect (meaning this test can be used) for the duration of the COVID-19 declaration under Section 564(b)(1) of the Act, 21 U.S.C.section 360bbb-3(b)(1), unless the authorization is terminated  or revoked sooner.       Influenza A by PCR NEGATIVE NEGATIVE Final   Influenza B by PCR NEGATIVE NEGATIVE Final    Comment: (NOTE) The Xpert Xpress SARS-CoV-2/FLU/RSV plus assay is intended as an aid in the diagnosis of influenza from Nasopharyngeal swab specimens and should not be used as a sole basis for treatment. Nasal washings and aspirates are unacceptable for Xpert Xpress SARS-CoV-2/FLU/RSV testing.  Fact Sheet for Patients: EntrepreneurPulse.com.au  Fact Sheet for Healthcare Providers: IncredibleEmployment.be  This test is not yet approved or cleared by the [place] FDA and has been authorized for detection and/or diagnosis of SARS-CoV-2 by FDA under an Emergency Use Authorization (EUA). This EUA will remain in effect (meaning this test can be used) for the duration of the COVID-19 declaration under Section 564(b)(1) of the Act, 21 U.S.C. section 360bbb-3(b)(1), unless the authorization is terminated or revoked.  Performed at [facility], [place] [address], [place], [place] [address]   Urine Culture     Status: Abnormal   Collection Time: 10/16/2021 10:42 AM   Specimen: In/Out Cath Urine  Result Value Ref Range Status   Specimen Description IN/OUT CATH URINE  Final   Special Requests   Final    NONE Performed at [facility], [place] [address], [place], [place] [address]    Culture (A)  Final    10,000 COLONIES/mL MULTIPLE SPECIES PRESENT, SUGGEST RECOLLECTION   Report Status 10/09/2021 FINAL  Final  Blood Culture (routine x 2)     Status: None (Preliminary  result)   Collection Time: 10/18/2021 10:54 AM   Specimen: BLOOD RIGHT ARM  Result Value Ref Range Status   Specimen Description BLOOD RIGHT ARM  Final   Special Requests   Final    BOTTLES DRAWN AEROBIC AND ANAEROBIC Blood Culture adequate volume   Culture   Final    NO GROWTH 2 DAYS Performed at [facility], [place] [address] [place], [place] [address]    Report Status PENDING  Incomplete  Blood Culture (routine x 2)     Status: None (Preliminary result)   Collection Time: 10/16/2021 11:00 AM   Specimen: BLOOD LEFT ARM  Result Value Ref Range Status   Specimen Description BLOOD LEFT ARM  Final   Special Requests   Final    BOTTLES DRAWN AEROBIC ONLY Blood Culture results may not be optimal due to an inadequate volume of blood received in culture bottles   Culture   Final    NO GROWTH 2 DAYS Performed at [facility], [place] [address] [place], [place] [address]    Report Status PENDING  Incomplete      Radiology Studies: DG CHEST PORT 1 VIEW  Result Date: 10/09/2021 CLINICAL DATA:  Congestive heart failure EXAM: PORTABLE CHEST 1 VIEW COMPARISON:  10/02/2021 FINDINGS: Chronic cardiomegaly. Stable mediastinal contours with atheromatous calcification. Small pleural effusions and mild generalized interstitial coarsening. IMPRESSION: CHF pattern which is similar to prior. Electronically Signed   By: Jorje Guild M.D.   On: 10/09/2021 07:52   DG Chest Port 1 View  Result Date: 10/28/2021 CLINICAL DATA:  Atrial fibrillation.  Discitis-osteomyelitis. EXAM: PORTABLE CHEST 1 VIEW COMPARISON:  08/02/2021 FINDINGS: Defibrillator patches noted. Cephalization of blood flow with pulmonary vascular indistinctness and left greater than right interstitial accentuation. Fine Kerley B lines noted. Upper normal heart size. No substantial blunting of the costophrenic angles. Thoracic spondylosis.  Old healed left lateral rib deformities. IMPRESSION: 1. Asymmetric interstitial accentuation and  vascular indistinctness, left greater than right, compatible with asymmetric pulmonary edema or possibly early aspiration pneumonitis on the left. Upper normal heart size. Electronically Signed   By: Van Clines M.D.   On: 10/26/2021 11:46       LOS: 2 days   [facility] Pager on [email or web address]  10/10/2021, 11:04 AM

## 2021-10-10 NOTE — Progress Notes (Signed)
Pharmacy Antibiotic Note  [name] is a 76 y.o. female admitted on 10/18/2021 with sepsis 2/2 osteomyelitis of L4-L5. Plans for IR 10/12/2021 AM with lumbar asipiration but found to be in SVT and brought to ED. Pharmacy has been consulted for vancomycin and cefepime dosing. Afebrile, WBCs 10.2. Scr improved.   Plan: Give additional vancomycin 500 mg IV x 1 after 750 mg dose on 7/9  Change vancomycin to 1250 mg IV q24h (eAUC 522, Scr 0.89) > goal AUC 400-550  > obtain vancomycin levels at steady state  Continue cefepime 2g q12h  F/u renal function, clinical course, and length of therapy  Height: '5\' 7"'$  (170.2 cm) Weight: 69.6 kg (153 lb 7 oz) IBW/kg (Calculated) : 61.6  Temp (24hrs), Avg:98.2 F (36.8 C), Min:97.8 F (36.6 C), Max:98.4 F (36.9 C)  Recent Labs  Lab 10/07/2021 1054 10/23/2021 1242 10/21/2021 1502 10/03/2021 1825 10/09/21 0142 10/10/21 0910  WBC 13.1*  --   --   --  10.7* 10.2  CREATININE 0.97  --   --   --  1.01* 0.89  LATICACIDVEN 1.8 2.4* 1.9 2.3* 1.8  --      Estimated Creatinine Clearance: 52.3 mL/min (by C-G formula based on SCr of 0.89 mg/dL).    Allergies  Allergen Reactions   Phenergan [Promethazine] Other (See Comments)    Listed on MAR Unknown reaction    Antimicrobials this admission: Cefepime 7/7 > Vancomycin 7/7 > Flagyl 7/7 >  Dose adjustments this admission:  Microbiology results: 7/7 BCx: NGTD 7/7 Ucx: 10,000/mL multiple spp   Thank you for allowing pharmacy to be a part of this patient's care.  Elsie Amis 10/10/2021 12:22 PM

## 2021-10-10 NOTE — Progress Notes (Signed)
Patient got agitated, irritated and upset about giving meds, insulin shot and food. Screamed at me when I was explaining about why you need to get 3 units of insulin for lunch.

## 2021-10-10 NOTE — Evaluation (Signed)
Occupational Therapy Evaluation Patient Details Name: [name] MRN: [id number] DOB: 03-Dec-1945 Today's Date: 10/10/2021   History of Present Illness Pt is 76 yo female who presents to [facility] on 10/25/2021 with Afib with RVR which occurred during aspiration biopsy of lumbar lesion after MRI on 7/5 revealed L4-5 discitis/ osteomyelitis and R psoas abscess. PMH: RA, DM, CHF, permanent Afib with episodes of RVR when sick/ septic,, GI bleed, anemia, CKD2, CVA, dementia, multiple pressure wounds and foot ulcers.   Clinical Impression   Pt admitted for concerns listed above. PTA pt reported that she has been requiring assist with all ADL's since her stroke earlier this year. At this time she is requiring mod-max A for all ADL's and functional mobility. Pt self limiting and reports that she does not want to mobilize. She sat EOB and quickly returned to supine, despite max encouragement. Recommending return to SNF to progress functional mobility and ADL performance. OT will continue to follow acutely.       Recommendations for follow up therapy are one component of a multi-disciplinary discharge planning process, led by the attending physician.  Recommendations may be updated based on patient status, additional functional criteria and insurance authorization.   Follow Up Recommendations  Skilled nursing-short term rehab (<3 hours/day)    Assistance Recommended at Discharge Frequent or constant Supervision/Assistance  Patient can return home with the following A lot of help with walking and/or transfers;A lot of help with bathing/dressing/bathroom;Assistance with cooking/housework;Assist for transportation;Help with stairs or ramp for entrance;Direct supervision/assist for medications management;Direct supervision/assist for financial management    Functional Status Assessment  Patient has had a recent decline in their functional status and demonstrates the ability to make significant improvements in  function in a reasonable and predictable amount of time.  Equipment Recommendations  None recommended by OT    Recommendations for Other Services       Precautions / Restrictions Precautions Precautions: Fall Restrictions Weight Bearing Restrictions: No      Mobility Bed Mobility Overal bed mobility: Needs Assistance Bed Mobility: Supine to Sit, Sit to Supine Rolling: Min assist   Supine to sit: Mod assist Sit to supine: Mod assist   General bed mobility comments: Pt did well with log roll technique, requiring mod A for truncal managment.    Transfers                   General transfer comment: Pt refused      Balance Overall balance assessment: Needs assistance Sitting-balance support: Feet supported Sitting balance-Leahy Scale: Fair Sitting balance - Comments: Unable to tolerate challenge                                   ADL either performed or assessed with clinical judgement   ADL Overall ADL's : Needs assistance/impaired Eating/Feeding: Set up;Sitting   Grooming: Set up;Sitting   Upper Body Bathing: Minimal assistance;Sitting   Lower Body Bathing: Maximal assistance;Sitting/lateral leans   Upper Body Dressing : Minimal assistance;Sitting   Lower Body Dressing: Maximal assistance;Sitting/lateral leans       Toileting- Clothing Manipulation and Hygiene: Maximal assistance;Sitting/lateral lean         General ADL Comments: Pt self limiting at times     Vision Baseline Vision/History: 1 Wears glasses Ability to See in Adequate Light: 0 Adequate Patient Visual Report: No change from baseline Vision Assessment?: No apparent visual deficits     Perception  Praxis      Pertinent Vitals/Pain Pain Assessment Pain Assessment: 0-10 Pain Score: 4  Pain Location: R hip and low back Pain Descriptors / Indicators: Discomfort, Grimacing Pain Intervention(s): Limited activity within patient's tolerance, Monitored during  session, Repositioned     Hand Dominance Right   Extremity/Trunk Assessment Upper Extremity Assessment Upper Extremity Assessment: Generalized weakness   Lower Extremity Assessment Lower Extremity Assessment: Generalized weakness   Cervical / Trunk Assessment Cervical / Trunk Assessment: Kyphotic   Communication Communication Communication: No difficulties   Cognition Arousal/Alertness: Awake/alert Behavior During Therapy: WFL for tasks assessed/performed Overall Cognitive Status: History of cognitive impairments - at baseline                                 General Comments: follows commands     General Comments  VSS on RA    Exercises     Shoulder Instructions      Home Living Family/patient expects to be discharged to:: Skilled nursing facility                                 Additional Comments: wants to get back home with longterm boyfriend but has been at [facility] since CVA. Relays frustration that she was getting therapy regularly but has not been recently      Prior Functioning/Environment Prior Level of Function : Needs assist             Mobility Comments: has been unable to walk, assist with transfers to w/c ADLs Comments: assisted by LTC SNF staff        OT Problem List: Decreased strength;Decreased range of motion;Decreased activity tolerance;Impaired balance (sitting and/or standing);Decreased safety awareness;Decreased cognition      OT Treatment/Interventions: Self-care/ADL training;Therapeutic exercise;Energy conservation;DME and/or AE instruction;Therapeutic activities;Cognitive remediation/compensation;Patient/family education;Balance training    OT Goals(Current goals can be found in the care plan section) Acute Rehab OT Goals Patient Stated Goal: To go back home OT Goal Formulation: With patient Time For Goal Achievement: 10/24/21 Potential to Achieve Goals: Good ADL Goals Pt Will Perform Grooming: with min  guard assist;standing Pt Will Perform Lower Body Bathing: with min assist;sitting/lateral leans Pt Will Perform Lower Body Dressing: with min assist;sitting/lateral leans Pt Will Transfer to Toilet: with mod assist;ambulating Pt Will Perform Toileting - Clothing Manipulation and hygiene: with min assist;sitting/lateral leans  OT Frequency: Min 2X/week    Co-evaluation              AM-PAC OT "6 Clicks" Daily Activity     Outcome Measure Help from another person eating meals?: A Little Help from another person taking care of personal grooming?: A Little Help from another person toileting, which includes using toliet, bedpan, or urinal?: A Lot Help from another person bathing (including washing, rinsing, drying)?: A Lot Help from another person to put on and taking off regular upper body clothing?: A Little Help from another person to put on and taking off regular lower body clothing?: A Lot 6 Click Score: 15   End of Session Nurse Communication: Mobility status  Activity Tolerance: Other (comment) (Pt self limiting) Patient left: in bed;with call bell/phone within reach;with bed alarm set  OT Visit Diagnosis: Unsteadiness on feet (R26.81);Other abnormalities of gait and mobility (R26.89);Muscle weakness (generalized) (M62.81)                Time: 1478-2956  OT Time Calculation (min): 25 min Charges:  OT General Charges $OT Visit: 1 Visit OT Evaluation $OT Eval Moderate Complexity: 1 Mod OT Treatments $Self Care/Home Management : 8-22 mins  Demarie Hyneman H., OTR/L Acute Rehabilitation  Carmeron Heady Elane Vlada Uriostegui 10/10/2021, 7:45 PM

## 2021-10-10 NOTE — NC FL2 (Signed)
[place] MEDICAID FL2 LEVEL OF CARE SCREENING TOOL     IDENTIFICATION  Patient Name: [name] Birthdate: [birth year] Sex: female Admission Date (Current Location): 10/05/2021  [place] and [place] Number:  Kathleen Argue [id number] [place] and Address:  [facility] [place], [place] [address] [place], [place] [address]      Provider Number: [id number]  Attending Physician Name and Address:  Bonnielee Haff, MD  Relative Name and Phone Number:  Maxwell Caul  [phone]    Current Level of Care: Hospital Recommended Level of Care: [place] Prior Approval Number:    Date Approved/Denied:   PASRR Number: [id number]  Discharge Plan: SNF    Current Diagnoses: Patient Active Problem List   Diagnosis Date Noted   Septic discitis of lumbar region 10/27/2021   Psoas abscess, right ([place]) 10/07/2021   Heartburn 10/04/2021   Hypothermia 08/03/2021   SIRS (systemic inflammatory response syndrome) ([place])    Dementia with agitation ([place])    Protein-calorie malnutrition, severe 06/19/2021   Encephalopathy    Contusion of scalp    Fever    Delirium    Agitation    Bacteremia due to Proteus species    Cellulitis of left lower extremity    Hypokalemia 99/83/3825   Acute metabolic encephalopathy 05/39/7673   Pressure injury of skin 05/21/2021   Hypomagnesemia 03/30/2020   Hypophosphatemia 03/30/2020   Hyperglycemia    Iliopsoas abscess on left ([place]) 10/04/2017   HCAP (healthcare-associated pneumonia) 10/04/2017   Acute lower UTI 10/04/2017   Hallucinations 07/25/2017   Abnormal LFTs 07/24/2017   Acute on chronic diastolic CHF (congestive heart failure) (HCC)    Elevated LFTs    AKI (acute kidney injury) ([place])    Elevated troponin    Stroke, acute, embolic ([place]) 41/93/7902   Mitral regurgitation 06/17/2017   Thyroid nodule 06/15/2017   Atrial fibrillation with RVR ([place]) 06/14/2017   CVA (cerebral vascular accident) ([place]) 06/14/2017    Peripheral vascular disease ([place])    Hiatal hernia    Chronic diastolic (congestive) heart failure (HCC)    Carpal tunnel syndrome    A-fib (HCC)    Chronic anemia    Actinic keratosis    Acute CHF (congestive heart failure) ([place]) 05/20/2016   History of GI bleed 03/29/2016   Chronic anticoagulation 03/29/2016   Symptomatic anemia 03/19/2016   Spinal stenosis of lumbar region 02/10/2016   Varicose veins of left leg with edema 09/15/2015   Iron deficiency anemia due to chronic blood loss 07/17/2015   Anemia of chronic renal failure, stage 3 (moderate) ([place]) 07/17/2015   Anticoagulant causing adverse effect in therapeutic use 07/17/2015   Symptomatic bradycardia 02/21/2015   Bradycardia    Abdominal wall mass of right lower quadrant 07/08/2013   OA (osteoarthritis) of hip 04/03/2013   Osteoarthritis of hip 01/18/2013   Night sweats 09/06/2012   Chest pain    Anxiety and depression    Gastroesophageal reflux disease    Hepatitis    Jaundice    Hypertension 07/01/2010   Type 2 diabetes mellitus with hyperlipidemia ([place]) 07/01/2010   Rheumatoid arthritis ([place]) 07/01/2010   Sinus tachycardia 07/01/2010   Chest pain, atypical 07/01/2010    Orientation RESPIRATION BLADDER Height & Weight     Self, Time, Situation, Place  Normal Incontinent, External catheter Weight: 153 lb 7 oz (69.6 kg) Height:  '5\' 7"'$  (170.2 cm)  BEHAVIORAL SYMPTOMS/MOOD NEUROLOGICAL BOWEL NUTRITION STATUS      Continent Diet (see discharge summary)  AMBULATORY  STATUS COMMUNICATION OF NEEDS Skin   Total Care Verbally Other (Comment) (redness)                       Personal Care Assistance Level of Assistance  Bathing, Feeding, Dressing Bathing Assistance: Maximum assistance Feeding assistance: Limited assistance Dressing Assistance: Maximum assistance     Functional Limitations Info  Sight, Hearing, Speech Sight Info: Adequate Hearing Info: Adequate Speech Info: Adequate    SPECIAL CARE FACTORS  FREQUENCY  PT (By licensed PT), OT (By licensed OT)     PT Frequency: 5x week OT Frequency: 5x week            Contractures Contractures Info: Not present    Additional Factors Info  Insulin Sliding Scale Code Status Info: DNR Allergies Info: Phenergan   Insulin Sliding Scale Info: Novolog, see discharge summary       Current Medications (10/10/2021):  This is the current hospital active medication list Current Facility-Administered Medications  Medication Dose Route Frequency Provider Last Rate Last Admin   (feeding supplement) PROSource Plus liquid 30 mL  30 mL Oral Daily Norins, Heinz Knuckles, MD       acetaminophen (TYLENOL) tablet 650 mg  650 mg Oral Q4H PRN Norins, Heinz Knuckles, MD       acetaminophen (TYLENOL) tablet 975 mg  975 mg Oral TID Neena Rhymes, MD   975 mg at 10/10/21 0859   albuterol (PROVENTIL) (2.5 MG/3ML) 0.083% nebulizer solution 2.5 mg  2.5 mg Inhalation Q4H PRN Neena Rhymes, MD       buPROPion ER [facility] SR) 12 hr tablet 100 mg  100 mg Oral BID Neena Rhymes, MD   100 mg at 10/10/21 0902   ceFEPIme (MAXIPIME) 2 g in sodium chloride 0.9 % 100 mL IVPB  2 g Intravenous Q12H Norins, Heinz Knuckles, MD   Stopped at 10/09/21 2250   cycloSPORINE (RESTASIS) 0.05 % ophthalmic emulsion 1 drop  1 drop Both Eyes BID Norins, Heinz Knuckles, MD   1 drop at 10/10/21 6010   diltiazem (CARDIZEM CD) 24 hr capsule 360 mg  360 mg Oral Daily Norins, Heinz Knuckles, MD   360 mg at 10/10/21 0904   diltiazem (CARDIZEM) 125 mg in dextrose 5% 125 mL (1 mg/mL) infusion  5-15 mg/hr Intravenous Continuous Tegeler, Gwenyth Allegra, MD 7.5 mL/hr at 10/10/21 0507 7.5 mg/hr at 10/10/21 0507   enoxaparin (LOVENOX) injection 40 mg  40 mg Subcutaneous Q24H Norins, Heinz Knuckles, MD   40 mg at 10/09/21 2218   ferrous sulfate tablet 325 mg  325 mg Oral BID WC Norins, Heinz Knuckles, MD   325 mg at 10/10/21 0900   gabapentin (NEURONTIN) capsule 100 mg  100 mg Oral BID Neena Rhymes, MD   100 mg at 10/10/21  9323   Gerhardt's butt cream   Topical PRN Norins, Heinz Knuckles, MD   1 Application at 55/73/22 0059   HYDROmorphone (DILAUDID) injection 0.5 mg  0.5 mg Intravenous Q3H PRN Neena Rhymes, MD   0.5 mg at 10/09/21 0353   insulin aspart (novoLOG) injection 0-20 Units  0-20 Units Subcutaneous TID WC Norins, Heinz Knuckles, MD   4 Units at 10/09/21 1758   ketotifen (ZADITOR) 0.025 % ophthalmic solution 1 drop  1 drop Both Eyes BID Norins, Heinz Knuckles, MD   1 drop at 10/10/21 0908   leflunomide (ARAVA) tablet 20 mg  20 mg Oral Daily Norins, Heinz Knuckles, MD   20 mg  at 10/10/21 0905   magnesium oxide (MAG-OX) tablet 400 mg  400 mg Oral BID Neena Rhymes, MD   400 mg at 10/10/21 0858   metoprolol tartrate (LOPRESSOR) tablet 100 mg  100 mg Oral BID Bonnielee Haff, MD       metoprolol tartrate (LOPRESSOR) tablet 50 mg  50 mg Oral Once Bonnielee Haff, MD       metroNIDAZOLE (FLAGYL) IVPB 500 mg  500 mg Intravenous Q12H Norins, Heinz Knuckles, MD   Stopped at 10/09/21 2303   mometasone-formoterol (DULERA) 200-5 MCG/ACT inhaler 2 puff  2 puff Inhalation BID Neena Rhymes, MD   2 puff at 10/10/21 3419   nitroGLYCERIN (NITROSTAT) SL tablet 0.4 mg  0.4 mg Sublingual Q5 min PRN Norins, Heinz Knuckles, MD       ondansetron [facility]) injection 4 mg  4 mg Intravenous Q6H PRN Norins, Heinz Knuckles, MD       Oral care mouth rinse  15 mL Mouth Rinse PRN Norins, Heinz Knuckles, MD       oxyCODONE (Oxy IR/ROXICODONE) immediate release tablet 7.5 mg  7.5 mg Oral Q6H Norins, Heinz Knuckles, MD   7.5 mg at 10/10/21 0601   pantoprazole (PROTONIX) EC tablet 40 mg  40 mg Oral BID Norins, Heinz Knuckles, MD   40 mg at 10/10/21 0900   polyvinyl alcohol (LIQUIFILM TEARS) 1.4 % ophthalmic solution 2 drop  2 drop Both Eyes Daily Norins, Heinz Knuckles, MD   2 drop at 10/09/21 0947   predniSONE (DELTASONE) tablet 5 mg  5 mg Oral Daily Norins, Heinz Knuckles, MD   5 mg at 10/10/21 0901   saccharomyces boulardii (FLORASTOR) capsule 250 mg  250 mg Oral Daily Norins, Heinz Knuckles, MD   250 mg at 10/10/21 0901   vancomycin (VANCOREADY) IVPB 750 mg/150 mL  750 mg Intravenous Q24H Levonne Spiller, [place] at 10/09/21 1247     Discharge Medications: Please see discharge summary for a list of discharge medications.  Relevant Imaging Results:  Relevant Lab Results:   Additional Information SSN: [id number]  Joanne Chars, LCSW

## 2021-10-10 NOTE — Progress Notes (Signed)
Pt's HR was assessed to be 110's to 120's during the beginning of the shift. Her Cardizem drip rate was changed to 7.5 mg. Through the shift pt's HR was sustaining in the 120's, Cardizem rate increased to 10 mg. Pt stable and HR down to the 90's and low 100's. Reported off to oncoming RN. Delia Heady RN

## 2021-10-11 DIAGNOSIS — M4640 Discitis, unspecified, site unspecified: Secondary | ICD-10-CM | POA: Diagnosis not present

## 2021-10-11 DIAGNOSIS — I5032 Chronic diastolic (congestive) heart failure: Secondary | ICD-10-CM | POA: Diagnosis not present

## 2021-10-11 DIAGNOSIS — K6812 Psoas muscle abscess: Secondary | ICD-10-CM | POA: Diagnosis not present

## 2021-10-11 DIAGNOSIS — I4891 Unspecified atrial fibrillation: Secondary | ICD-10-CM | POA: Diagnosis not present

## 2021-10-11 LAB — BASIC METABOLIC PANEL
Anion gap: 15 (ref 5–15)
BUN: 25 mg/dL — ABNORMAL HIGH (ref 8–23)
CO2: 19 mmol/L — ABNORMAL LOW (ref 22–32)
Calcium: 9.7 mg/dL (ref 8.9–10.3)
Chloride: 105 mmol/L (ref 98–111)
Creatinine, Ser: 0.85 mg/dL (ref 0.44–1.00)
GFR, Estimated: >60 mL/min (ref >60)
Glucose, Bld: 97 mg/dL (ref 70–99)
Potassium: 4.4 mmol/L (ref 3.5–5.1)
Sodium: 139 mmol/L (ref 135–145)

## 2021-10-11 LAB — CBC WITH DIFFERENTIAL/PLATELET
Abs Immature Granulocytes: 0.14 10*3/uL — ABNORMAL HIGH (ref 0.00–0.07)
Basophils Absolute: 0.1 10*3/uL (ref 0.0–0.1)
Basophils Relative: 1 %
Eosinophils Absolute: 0.4 10*3/uL (ref 0.0–0.5)
Eosinophils Relative: 4 %
HCT: 35.9 % — ABNORMAL LOW (ref 36.0–46.0)
Hemoglobin: 11.1 g/dL — ABNORMAL LOW (ref 12.0–15.0)
Immature Granulocytes: 1 %
Lymphocytes Relative: 12 %
Lymphs Abs: 1.2 10*3/uL (ref 0.7–4.0)
MCH: 28.8 pg (ref 26.0–34.0)
MCHC: 30.9 g/dL (ref 30.0–36.0)
MCV: 93.2 fL (ref 80.0–100.0)
Monocytes Absolute: 0.7 10*3/uL (ref 0.1–1.0)
Monocytes Relative: 7 %
Neutro Abs: 7.3 10*3/uL (ref 1.7–7.7)
Neutrophils Relative %: 75 %
Platelets: 366 10*3/uL (ref 150–400)
RBC: 3.85 MIL/uL — ABNORMAL LOW (ref 3.87–5.11)
RDW: 15.9 % — ABNORMAL HIGH (ref 11.5–15.5)
WBC: 9.8 10*3/uL (ref 4.0–10.5)
nRBC: 0 % (ref 0.0–0.2)

## 2021-10-11 LAB — GLUCOSE, CAPILLARY
Glucose-Capillary: 99 mg/dL (ref 70–99)
Glucose-Capillary: 108 mg/dL — ABNORMAL HIGH (ref 70–99)
Glucose-Capillary: 108 mg/dL — ABNORMAL HIGH (ref 70–99)

## 2021-10-11 LAB — PROTIME-INR
INR: 1.3 — ABNORMAL HIGH (ref 0.8–1.2)
Prothrombin Time: 15.7 seconds — ABNORMAL HIGH (ref 11.4–15.2)

## 2021-10-11 MED ORDER — METRONIDAZOLE 500 MG PO TABS
500.0000 mg | ORAL_TABLET | Freq: Two times a day (BID) | ORAL | Status: DC
Start: 2021-10-11 — End: 2021-10-15
  Administered 2021-10-11 – 2021-10-14 (×7): 500 mg via ORAL
  Filled 2021-10-11 (×8): qty 1

## 2021-10-11 NOTE — Progress Notes (Signed)
[facility] PROGRESS NOTE   [name] IDP:[id number] DOB: [birth year] DOA: 10/25/2021  PCP: Aletha Halim., PA-C  Brief History/Interval Summary: 76 y/o, with multiple co-morbidities including RA, DM, HFpEF, permanent a. Fib with episodes of RVR when sick, not a candidate for anticoagulation 2/2 h/o GI bleed from AVM, chronic blood loss anemia, CKD II, h/o CVA, Dementia, multiple pressure wounds and diabetic foot ulcers has had increasing back pain for several weeks. She is followed by Dr. Annette Stable with Neurosurgery. She had an MRI lumbar 10/06/21 revealing L4-5 discitis/osteomyelitis and a right psoas abscess. She was in IR dept for aspiration bx of lumbar lesion when she experienced atrial fibrillation with RVR.  Transferred to the emergency department.  The procedure was canceled.  She was subsequently hospitalized.    Consultants: Neurosurgery.  Interventional radiology.  Procedures: None yet    Subjective/Interval History: Patient mentions that her back pain is reasonably well controlled at this time.  Denies any nausea vomiting.  Overnight events noted.  Noted to be somewhat agitated yesterday evening.  Seems to be stable this morning.     Assessment/Plan:  Lumbar discitis/right psoas abscess Noted on recent MRI.  Seen by neurosurgery.  Plan is for disc aspiration.  This was attempted on 7/7 by interventional radiology but she went into atrial fibrillation with RVR so the procedure had to be canceled. Hopefully can be done today or tomorrow. Noted to be on vancomycin cefepime and metronidazole.  Will eventually need to be seen by infectious disease as well. She apparently has had previous episodes of psoas abscess as well.  Atrial fibrillation with RVR Patient has a history of permanent atrial fibrillation.  She apparently develops episodes of RVR whenever she is ill. Patient is on her oral Cardizem and metoprolol.   Noted to be on 5 mg/h of diltiazem infusion.  Dose of  metoprolol was increased yesterday.  Blood pressure noted to be soft.  Monitor for now.  She is asymptomatic. We will leave her on diltiazem infusion until disc aspiration has been completed.   Not on anticoagulation due to previous history of significant GI bleed.  Chronic diastolic CHF Seems to be fairly euvolemic.  It appears that she was given furosemide x1 in the emergency department due to findings suggestive of CHF on chest x-ray..  Not on diuretics on a chronic basis.  Continue to monitor volume status. Repeat chest x-ray showed improvement.  She is saturating in the mid to late 90s on room air.  Continue to monitor off of diuretics.  Rheumatoid arthritis Followed by rheumatology.  Noted to be on prednisone on a chronic basis which is being continued.  Also noted to be on [place].  Diabetes mellitus type 2 with hyperlipidemia HbA1c of 5.7 in February.  Continue SSI.  Normocytic anemia Likely anemia of chronic disease.  No evidence of overt blood loss currently.  Dementia Seems to be stable.  Has had delirium and agitation during previous hospitalization.  Did have some agitation yesterday.  Haldol as needed ordered.  Has not required it yet.  Continue to monitor.  Hypomagnesemia Corrected.  Right buttock decubitus stage III Pressure Injury 10/06/2021 Buttocks Right;Upper Stage 3 -  Full thickness tissue loss. Subcutaneous fat may be visible but bone, tendon or muscle are NOT exposed. (Active)  10/07/2021   Location: Buttocks  Location Orientation: Right;Upper  Staging: Stage 3 -  Full thickness tissue loss. Subcutaneous fat may be visible but bone, tendon or muscle are NOT exposed.  Wound Description (Comments):   Present on Admission: Yes    DVT Prophylaxis: Lovenox Code Status: DNR Family Communication: No family at bedside.  Discussed with patient Disposition Plan: Apparently lives at a skilled nursing facility, [facility]  Anticipate discharge back to the facility when  medically stable.  Status is: Inpatient Remains inpatient appropriate because: Discitis    Medications: Scheduled:  (feeding supplement) PROSource Plus  30 mL Oral Daily   acetaminophen  975 mg Oral TID   buPROPion ER  100 mg Oral BID   cycloSPORINE  1 drop Both Eyes BID   diltiazem  360 mg Oral Daily   enoxaparin (LOVENOX) injection  40 mg Subcutaneous Q24H   ferrous sulfate  325 mg Oral BID WC   gabapentin  100 mg Oral BID   insulin aspart  0-20 Units Subcutaneous TID WC   ketotifen  1 drop Both Eyes BID   leflunomide  20 mg Oral Daily   magnesium oxide  400 mg Oral BID   metoprolol tartrate  100 mg Oral BID   mometasone-formoterol  2 puff Inhalation BID   oxyCODONE  7.5 mg Oral Q6H   pantoprazole  40 mg Oral BID   polyvinyl alcohol  2 drop Both Eyes Daily   predniSONE  5 mg Oral Daily   saccharomyces boulardii  250 mg Oral Daily   Continuous:  ceFEPime (MAXIPIME) IV Stopped (10/11/21 0133)   diltiazem (CARDIZEM) infusion 5 mg/hr (10/11/21 0133)   metronidazole 500 mg (10/10/21 2256)   vancomycin     EHM:[id number], albuterol, Gerhardt's butt cream, haloperidol lactate, HYDROmorphone (DILAUDID) injection, nitroGLYCERIN, ondansetron (ZOFRAN) IV, mouth rinse  Antibiotics: Anti-infectives (From admission, onward)    Start     Dose/Rate Route Frequency Ordered Stop   10/11/21 1200  vancomycin (VANCOREADY) IVPB 1250 mg/250 mL        1,250 mg 166.7 mL/hr over 90 Minutes Intravenous Every 24 hours 10/10/21 1221     10/10/21 1300  vancomycin (VANCOREADY) IVPB 500 mg/100 mL        500 mg 100 mL/hr over 60 Minutes Intravenous  Once 10/10/21 1221 10/10/21 2002   10/09/21 1100  vancomycin (VANCOREADY) IVPB 750 mg/150 mL        750 mg 150 mL/hr over 60 Minutes Intravenous Every 24 hours 10/31/2021 1351 10/10/21 2002   10/29/2021 2300  ceFEPIme (MAXIPIME) 2 g in sodium chloride 0.9 % 100 mL IVPB        2 g 200 mL/hr over 30 Minutes Intravenous Every 12 hours 10/18/2021 1305      10/18/2021 2300  metroNIDAZOLE (FLAGYL) IVPB 500 mg        500 mg 100 mL/hr over 60 Minutes Intravenous Every 12 hours 10/07/2021 1305     10/10/2021 1100  vancomycin (VANCOREADY) IVPB 1500 mg/300 mL        1,500 mg 150 mL/hr over 120 Minutes Intravenous  Once 10/07/2021 1046 10/18/2021 1345   10/09/2021 1045  ceFEPIme (MAXIPIME) 2 g in sodium chloride 0.9 % 100 mL IVPB        2 g 200 mL/hr over 30 Minutes Intravenous  Once 10/09/2021 1043 10/23/2021 1132   10/31/2021 1045  metroNIDAZOLE (FLAGYL) IVPB 500 mg        500 mg 100 mL/hr over 60 Minutes Intravenous  Once 10/02/2021 1043 10/25/2021 1226   10/12/2021 1045  vancomycin (VANCOCIN) IVPB 1000 mg/200 mL premix  Status:  Discontinued        1,000 mg 200 mL/hr over 60  Minutes Intravenous  Once 10/25/2021 1043 10/31/2021 1046       Objective:  Vital Signs  Vitals:   10/10/21 1113 10/10/21 1922 10/10/21 1937 10/11/21 0739  BP: 115/87  98/63   Pulse: (!) 106 93 95 (!) 106  Resp: '19 17 19 18  '$ Temp:   98.6 F (37 C)   TempSrc:   Oral   SpO2: 96% 94% 98% 98%  Weight:      Height:        Intake/Output Summary (Last 24 hours) at 10/11/2021 1056 Last data filed at 10/11/2021 0849 Gross per 24 hour  Intake 1026.6 ml  Output 250 ml  Net 776.6 ml    Filed Weights   10/21/2021 1649 10/09/21 0400 10/10/21 0335  Weight: 62.2 kg 66.2 kg 69.6 kg    General appearance: Awake alert.  In no distress.  Distracted. Resp: Clear to auscultation bilaterally.  Normal effort Cardio: S1-S2 is irregularly irregular.  Telemetry reviewed.  Heart rate between 90-110. GI: Abdomen is soft.  Nontender nondistended.  Bowel sounds are present normal.  No masses organomegaly Extremities: No edema.  Full range of motion of lower extremities. Neurologic:No focal neurological deficits.     Lab Results:  Data Reviewed: I have personally reviewed following labs and reports of the imaging studies  CBC: Recent Labs  Lab 10/02/2021 1054 10/09/21 0142 10/10/21 0910  10/11/21 0546  WBC 13.1* 10.7* 10.2 9.8  NEUTROABS 10.2*  --   --  7.3  HGB 11.1* 10.2* 11.4* 11.1*  HCT 35.7* 32.5* 35.9* 35.9*  MCV 94.2 93.4 91.8 93.2  PLT 347 331 376 366     Basic Metabolic Panel: Recent Labs  Lab 10/27/2021 1054 10/09/21 0142 10/10/21 0910 10/11/21 0546  NA 139 140 138 139  K 4.1 3.9 3.5 4.4  CL 107 104 104 105  CO2 20* 23 22 19*  GLUCOSE 135* 127* 94 97  BUN 25* 25* 21 25*  CREATININE 0.97 1.01* 0.89 0.85  CALCIUM 9.3 9.0 9.3 9.7  MG 1.6* 1.9  --   --      GFR: Estimated Creatinine Clearance: 54.8 mL/min (by C-G formula based on SCr of 0.85 mg/dL).  Liver Function Tests: Recent Labs  Lab 10/29/2021 1054  AST 19  ALT 21  ALKPHOS 152*  BILITOT 0.7  PROT 6.6  ALBUMIN 2.5*      Coagulation Profile: Recent Labs  Lab 10/23/2021 1004 10/24/2021 1054 10/11/21 0546  INR 1.4* 1.2 1.3*      CBG: Recent Labs  Lab 10/10/21 0741 10/10/21 1111 10/10/21 1622 10/10/21 2134 10/11/21 0628  GLUCAP 104* 130* 139* 99 108*      Recent Results (from the past 240 hour(s))  Resp Panel by RT-PCR (Flu A&B, Covid) Anterior Nasal Swab     Status: None   Collection Time: 10/13/2021 10:42 AM   Specimen: Anterior Nasal Swab  Result Value Ref Range Status   SARS Coronavirus 2 by RT PCR NEGATIVE NEGATIVE Final    Comment: (NOTE) SARS-CoV-2 target nucleic acids are NOT DETECTED.  The SARS-CoV-2 RNA is generally detectable in upper respiratory specimens during the acute phase of infection. The lowest concentration of SARS-CoV-2 viral copies this assay can detect is 138 copies/mL. A negative result does not preclude SARS-Cov-2 infection and should not be used as the sole basis for treatment or other patient management decisions. A negative result may occur with  improper specimen collection/handling, submission of specimen other than nasopharyngeal swab, presence of viral mutation(s) within  the areas targeted by this assay, and inadequate number of  viral copies(<138 copies/mL). A negative result must be combined with clinical observations, patient history, and epidemiological information. The expected result is Negative.  Fact Sheet for Patients:  EntrepreneurPulse.com.au  Fact Sheet for Healthcare Providers:  IncredibleEmployment.be  This test is no t yet approved or cleared by the [place] FDA and  has been authorized for detection and/or diagnosis of SARS-CoV-2 by FDA under an Emergency Use Authorization (EUA). This EUA will remain  in effect (meaning this test can be used) for the duration of the COVID-19 declaration under Section 564(b)(1) of the Act, 21 U.S.C.section 360bbb-3(b)(1), unless the authorization is terminated  or revoked sooner.       Influenza A by PCR NEGATIVE NEGATIVE Final   Influenza B by PCR NEGATIVE NEGATIVE Final    Comment: (NOTE) The Xpert Xpress SARS-CoV-2/FLU/RSV plus assay is intended as an aid in the diagnosis of influenza from Nasopharyngeal swab specimens and should not be used as a sole basis for treatment. Nasal washings and aspirates are unacceptable for Xpert Xpress SARS-CoV-2/FLU/RSV testing.  Fact Sheet for Patients: EntrepreneurPulse.com.au  Fact Sheet for Healthcare Providers: IncredibleEmployment.be  This test is not yet approved or cleared by the [place] FDA and has been authorized for detection and/or diagnosis of SARS-CoV-2 by FDA under an Emergency Use Authorization (EUA). This EUA will remain in effect (meaning this test can be used) for the duration of the COVID-19 declaration under Section 564(b)(1) of the Act, 21 U.S.C. section 360bbb-3(b)(1), unless the authorization is terminated or revoked.  Performed at [facility], [place] [address] [place], [place] [address]   Urine Culture     Status: Abnormal   Collection Time: 10/25/2021 10:42 AM   Specimen: In/Out Cath Urine  Result  Value Ref Range Status   Specimen Description IN/OUT CATH URINE  Final   Special Requests   Final    NONE Performed at [facility], [place] [name] [place], [place] [address]    Culture (A)  Final    10,000 COLONIES/mL MULTIPLE SPECIES PRESENT, SUGGEST RECOLLECTION   Report Status 10/09/2021 FINAL  Final  Blood Culture (routine x 2)     Status: None (Preliminary result)   Collection Time: 10/29/2021 10:54 AM   Specimen: BLOOD RIGHT ARM  Result Value Ref Range Status   Specimen Description BLOOD RIGHT ARM  Final   Special Requests   Final    BOTTLES DRAWN AEROBIC AND ANAEROBIC Blood Culture adequate volume   Culture   Final    NO GROWTH 3 DAYS Performed at [facility], [place] [address], [place], [place] [address]    Report Status PENDING  Incomplete  Blood Culture (routine x 2)     Status: None (Preliminary result)   Collection Time: 10/06/2021 11:00 AM   Specimen: BLOOD LEFT ARM  Result Value Ref Range Status   Specimen Description BLOOD LEFT ARM  Final   Special Requests   Final    BOTTLES DRAWN AEROBIC ONLY Blood Culture results may not be optimal due to an inadequate volume of blood received in culture bottles   Culture   Final    NO GROWTH 3 DAYS Performed at [facility], [place] [address], [place], [place] [address]    Report Status PENDING  Incomplete      Radiology Studies: No results found.     LOS: 3 days   [facility] Pager  on [email or web address]  10/11/2021, 10:56 AM

## 2021-10-11 NOTE — TOC Progression Note (Addendum)
Transition of Care [facility]) - Progression Note    Patient Details  Name: [name] MRN: [id number] Date of Birth: 11-04-1945  Transition of Care [facility]) CM/SW [place], [place] Phone Number: 10/11/2021, 2:25 PM  Clinical Narrative:     CSW contacted [facility] and left message with admission. Melissa with [facility] called back and stated pt will be fine to return at DC. She provided her direct #: [phone]  Expected Discharge Plan: [place] Barriers to Discharge: Continued Medical Work up, SNF Pending bed offer  Expected Discharge Plan and Services Expected Discharge Plan: [place] In-house Referral: Clinical Social Work   Post Acute Care Choice: [place] Living arrangements for the past 2 months: [place]                                       Social Determinants of Health (SDOH) Interventions    Readmission Risk Interventions    08/03/2021   11:07 AM 07/26/2021    2:12 PM 06/29/2021   11:03 AM  Readmission Risk Prevention Plan  Transportation Screening Complete Complete Complete  Medication Review Press photographer) Complete  Complete  HRI or Home Care Consult Complete Complete Complete  SW Recovery Care/Counseling Consult Complete Complete Complete  Palliative Care Screening Not Applicable  Complete  Skilled Nursing Facility Complete Complete Complete

## 2021-10-12 DIAGNOSIS — K6812 Psoas muscle abscess: Secondary | ICD-10-CM | POA: Diagnosis not present

## 2021-10-12 DIAGNOSIS — I5032 Chronic diastolic (congestive) heart failure: Secondary | ICD-10-CM | POA: Diagnosis not present

## 2021-10-12 DIAGNOSIS — I4891 Unspecified atrial fibrillation: Secondary | ICD-10-CM | POA: Diagnosis not present

## 2021-10-12 DIAGNOSIS — M4640 Discitis, unspecified, site unspecified: Secondary | ICD-10-CM | POA: Diagnosis not present

## 2021-10-12 LAB — GLUCOSE, CAPILLARY
Glucose-Capillary: 131 mg/dL — ABNORMAL HIGH (ref 70–99)
Glucose-Capillary: 132 mg/dL — ABNORMAL HIGH (ref 70–99)
Glucose-Capillary: 116 mg/dL — ABNORMAL HIGH (ref 70–99)
Glucose-Capillary: 106 mg/dL — ABNORMAL HIGH (ref 70–99)
Glucose-Capillary: 110 mg/dL — ABNORMAL HIGH (ref 70–99)
Glucose-Capillary: 139 mg/dL — ABNORMAL HIGH (ref 70–99)

## 2021-10-12 LAB — BASIC METABOLIC PANEL
Anion gap: 14 (ref 5–15)
BUN: 21 mg/dL (ref 8–23)
CO2: 19 mmol/L — ABNORMAL LOW (ref 22–32)
Calcium: 10 mg/dL (ref 8.9–10.3)
Chloride: 107 mmol/L (ref 98–111)
Creatinine, Ser: 0.78 mg/dL (ref 0.44–1.00)
GFR, Estimated: >60 mL/min (ref >60)
Glucose, Bld: 98 mg/dL (ref 70–99)
Potassium: 4 mmol/L (ref 3.5–5.1)
Sodium: 140 mmol/L (ref 135–145)

## 2021-10-12 NOTE — Progress Notes (Signed)
Physical Therapy Treatment Patient Details Name: [name] MRN: [id number] DOB: 02-Mar-1946 Today's Date: 10/12/2021   History of Present Illness Pt is 76 yo female who [facility] to [facility] with Afib with RVR which occurred during aspiration biopsy of lumbar lesion after MRI on 7/5 revealed L4-5 discitis/ osteomyelitis and R psoas abscess. PMH: RA, DM, CHF, permanent Afib with episodes of RVR when sick/ septic,, GI bleed, anemia, CKD2, CVA, dementia, multiple pressure wounds and foot ulcers.    PT Comments    Pt received supine and agreeable to session with pt agreeable for transfer training to [facility]. Pt able to come to sitting EOB with significantly increased time with mod a to bring BLEs to and off EOB, pt able to elevate trunk with use of rail despite cues encouraging log roll technique for back pain mediation. Encouraged pt to remain in sitting and to scoot to place feet on floor with pt becoming agitated and swiftly lying back down despite continued encouragement. Current plan remains appropriate to address deficits and maximize functional independence and decrease caregiver burden. Pt continues to benefit from skilled PT services to progress toward functional mobility goals.    Recommendations for follow up therapy are one component of a multi-disciplinary discharge planning process, led by the attending physician.  Recommendations may be updated based on patient status, additional functional criteria and insurance authorization.  Follow Up Recommendations  Skilled nursing-short term rehab (<3 hours/day) Can patient physically be transported by private vehicle: No   Assistance Recommended at Discharge Frequent or constant Supervision/Assistance  Patient can return home with the following Two people to help with walking and/or transfers;Two people to help with bathing/dressing/bathroom;Assistance with cooking/housework;Direct supervision/assist for medications management;Direct  supervision/assist for financial management;Assist for transportation;Help with stairs or ramp for entrance   Equipment Recommendations  None recommended by PT    Recommendations for Other Services       Precautions / Restrictions Precautions Precautions: Fall Restrictions Weight Bearing Restrictions: No     Mobility  Bed Mobility Overal bed mobility: Needs Assistance Bed Mobility: Supine to Sit, Sit to Supine     Supine to sit: Mod assist Sit to supine: Min guard   General bed mobility comments: encouraged log rolling with pt sitting up to long sitting instead with use of bedrail. mod a to scoot to EOB, encouraged pt to place B feet on floor with pt stating inability and swiftly returning to supine despite encouragement to stay sitting up and to attempt trasnfer    Transfers                   General transfer comment: Pt refused    Ambulation/Gait               General Gait Details: has been using w/c at [facility]   Stairs             Wheelchair Mobility    Modified Rankin (Stroke Patients Only)       Balance Overall balance assessment: Needs assistance Sitting-balance support: Feet supported Sitting balance-Leahy Scale: Fair Sitting balance - Comments: Unable to tolerate challenge                                    Cognition Arousal/Alertness: Awake/alert Behavior During Therapy: Agitated, Anxious Overall Cognitive Status: History of cognitive impairments - at baseline  General Comments: follows commands intermittently        Exercises      General Comments General comments (skin integrity, edema, etc.): VSS on RA      Pertinent Vitals/Pain Pain Assessment Pain Assessment: Faces Faces Pain Scale: Hurts little more Pain Location: low back Pain Descriptors / Indicators: Discomfort, Grimacing Pain Intervention(s): Monitored during session, Limited activity within  patient's tolerance    Home Living                          Prior Function            PT Goals (current goals can now be found in the care plan section) Acute Rehab PT Goals PT Goal Formulation: With patient Time For Goal Achievement: 10/23/21    Frequency    Min 2X/week      PT Plan      Co-evaluation              AM-PAC PT "6 Clicks" Mobility   Outcome Measure  Help needed turning from your back to your side while in a flat bed without using bedrails?: A Little Help needed moving from lying on your back to sitting on the side of a flat bed without using bedrails?: Total Help needed moving to and from a bed to a chair (including a wheelchair)?: Total Help needed standing up from a chair using your arms (e.g., wheelchair or bedside chair)?: Total Help needed to walk in hospital room?: Total Help needed climbing 3-5 steps with a railing? : Total 6 Click Score: 8    End of Session   Activity Tolerance: Patient limited by pain;Other (comment) (limited by agitation) Patient left: in bed;with call bell/phone within reach;with family/visitor present Nurse Communication: Mobility status PT Visit Diagnosis: Pain;Muscle weakness (generalized) (M62.81);Difficulty in walking, not elsewhere classified (R26.2) Pain - Right/Left: Right Pain - part of body: Hip     Time: 4585-9292 PT Time Calculation (min) (ACUTE ONLY): 14 min  Charges:  $Therapeutic Activity: 8-22 mins                     Elysia Grand R. PTA Acute Rehabilitation Services Office: [place] 10/12/2021, 3:45 PM

## 2021-10-12 NOTE — Telephone Encounter (Signed)
Patient is currently in the hospital.

## 2021-10-12 NOTE — Progress Notes (Addendum)
[facility] PROGRESS NOTE   [name] PXT:[id number] DOB: [birth year] DOA: 10/16/2021  PCP: Aletha Halim., PA-C  Brief History/Interval Summary: 76 y/o, with multiple co-morbidities including RA, DM, HFpEF, permanent a. Fib with episodes of RVR when sick, not a candidate for anticoagulation 2/2 h/o GI bleed from AVM, chronic blood loss anemia, CKD II, h/o CVA, Dementia, multiple pressure wounds and diabetic foot ulcers has had increasing back pain for several weeks. She is followed by Dr. Annette Stable with Neurosurgery. She had an MRI lumbar 10/06/21 revealing L4-5 discitis/osteomyelitis and a right psoas abscess. She was in IR dept for aspiration bx of lumbar lesion when she experienced atrial fibrillation with RVR.  Transferred to the emergency department.  The procedure was canceled.  She was subsequently hospitalized.    Consultants: Neurosurgery.  Interventional radiology.  Procedures: None yet    Subjective/Interval History: Patient mildly distracted.  Denies any chest pain shortness of breath.  Back continues to hurt.    Assessment/Plan:  Lumbar discitis/right psoas abscess/Sepsis POA In ED she was febrile, tachycardic, had a mild leukocytosis  Noted on recent MRI.  Seen by neurosurgery.  Plan is for disc aspiration.  This was attempted on 7/7 by interventional radiology but she went into atrial fibrillation with RVR so the procedure had to be canceled. Noted to be on vancomycin cefepime and metronidazole.  Will eventually need to be seen by infectious disease as well. She apparently has had previous episodes of psoas abscess as well. Interventional radiology to try aspiration today.  They tried to reach out to patient's son yesterday but were not successful.  Atrial fibrillation with RVR Patient has a history of permanent atrial fibrillation.  She apparently develops episodes of RVR whenever she is ill. Patient is on her oral Cardizem and metoprolol.   Noted to be on 5  mg/h of diltiazem infusion.  Dose of metoprolol was increased.   Heart rate is stable.  Blood pressure is stable as well.  She remains asymptomatic. We will leave her on diltiazem infusion until disc aspiration has been completed.   Not on anticoagulation due to previous history of significant GI bleed.  Chronic diastolic CHF Seems to be fairly euvolemic.  It appears that she was given furosemide x1 in the emergency department due to findings suggestive of CHF on chest x-ray..  Not on diuretics on a chronic basis.  Continue to monitor volume status. Repeat chest x-ray showed improvement.  She is saturating in the mid to late 90s on room air.  Continue to monitor off of diuretics.  Rheumatoid arthritis Followed by rheumatology.  Noted to be on prednisone on a chronic basis which is being continued.  Also noted to be on [place].  Diabetes mellitus type 2 with hyperlipidemia HbA1c of 5.7 in February.  Continue SSI.  Normocytic anemia Likely anemia of chronic disease.  No evidence of overt blood loss currently.  Dementia Seems to be stable.  Has had delirium and agitation during previous hospitalization.  Haldol as needed ordered.   Continue to monitor.  Hypomagnesemia Corrected.  Right buttock decubitus stage III Pressure Injury 10/05/2021 Buttocks Right;Upper Stage 3 -  Full thickness tissue loss. Subcutaneous fat may be visible but bone, tendon or muscle are NOT exposed. (Active)  10/28/2021   Location: Buttocks  Location Orientation: Right;Upper  Staging: Stage 3 -  Full thickness tissue loss. Subcutaneous fat may be visible but bone, tendon or muscle are NOT exposed.  Wound Description (Comments):   Present on  Admission: Yes    DVT Prophylaxis: Lovenox Code Status: DNR Family Communication: No family at bedside.  Discussed with patient Disposition Plan: Apparently lives at a skilled nursing facility, [facility].  Anticipate discharge back to the facility when medically  stable.  Status is: Inpatient Remains inpatient appropriate because: Discitis    Medications: Scheduled:  (feeding supplement) PROSource Plus  30 mL Oral Daily   acetaminophen  975 mg Oral TID   buPROPion ER  100 mg Oral BID   cycloSPORINE  1 drop Both Eyes BID   diltiazem  360 mg Oral Daily   enoxaparin (LOVENOX) injection  40 mg Subcutaneous Q24H   ferrous sulfate  325 mg Oral BID WC   gabapentin  100 mg Oral BID   insulin aspart  0-20 Units Subcutaneous TID WC   ketotifen  1 drop Both Eyes BID   leflunomide  20 mg Oral Daily   magnesium oxide  400 mg Oral BID   metoprolol tartrate  100 mg Oral BID   metroNIDAZOLE  500 mg Oral Q12H   mometasone-formoterol  2 puff Inhalation BID   oxyCODONE  7.5 mg Oral Q6H   pantoprazole  40 mg Oral BID   polyvinyl alcohol  2 drop Both Eyes Daily   predniSONE  5 mg Oral Daily   saccharomyces boulardii  250 mg Oral Daily   Continuous:  ceFEPime (MAXIPIME) IV 2 g (10/11/21 2308)   diltiazem (CARDIZEM) infusion 5 mg/hr (10/11/21 1956)   vancomycin 1,250 mg (10/11/21 1254)   XBJ:YNWGNFAOZHYQM, albuterol, Gerhardt's butt cream, haloperidol lactate, HYDROmorphone (DILAUDID) injection, nitroGLYCERIN, ondansetron (ZOFRAN) IV, mouth rinse  Antibiotics: Anti-infectives (From admission, onward)    Start     Dose/Rate Route Frequency Ordered Stop   10/11/21 1245  metroNIDAZOLE (FLAGYL) tablet 500 mg        500 mg Oral Every 12 hours 10/11/21 1159     10/11/21 1200  vancomycin (VANCOREADY) IVPB 1250 mg/250 mL        1,250 mg 166.7 mL/hr over 90 Minutes Intravenous Every 24 hours 10/10/21 1221     10/10/21 1300  vancomycin (VANCOREADY) IVPB 500 mg/100 mL        500 mg 100 mL/hr over 60 Minutes Intravenous  Once 10/10/21 1221 10/10/21 2002   10/09/21 1100  vancomycin (VANCOREADY) IVPB 750 mg/150 mL        750 mg 150 mL/hr over 60 Minutes Intravenous Every 24 hours 10/21/2021 1351 10/10/21 2002   10/27/2021 2300  ceFEPIme (MAXIPIME) 2 g in sodium  chloride 0.9 % 100 mL IVPB        2 g 200 mL/hr over 30 Minutes Intravenous Every 12 hours 10/10/2021 1305     10/03/2021 2300  metroNIDAZOLE (FLAGYL) IVPB 500 mg  Status:  Discontinued        500 mg 100 mL/hr over 60 Minutes Intravenous Every 12 hours 10/09/2021 1305 10/11/21 1159   11/01/2021 1100  vancomycin (VANCOREADY) IVPB 1500 mg/300 mL        1,500 mg 150 mL/hr over 120 Minutes Intravenous  Once 10/25/2021 1046 10/16/2021 1345   10/05/2021 1045  ceFEPIme (MAXIPIME) 2 g in sodium chloride 0.9 % 100 mL IVPB        2 g 200 mL/hr over 30 Minutes Intravenous  Once 10/11/2021 1043 10/06/2021 1132   10/26/2021 1045  metroNIDAZOLE (FLAGYL) IVPB 500 mg        500 mg 100 mL/hr over 60 Minutes Intravenous  Once 10/16/2021 1043 10/17/2021 1226  10/18/2021 1045  vancomycin (VANCOCIN) IVPB 1000 mg/200 mL premix  Status:  Discontinued        1,000 mg 200 mL/hr over 60 Minutes Intravenous  Once 10/09/2021 1043 10/20/2021 1046       Objective:  Vital Signs  Vitals:   10/12/21 0400 10/12/21 0721 10/12/21 0744 10/12/21 0758  BP: (!) 148/96 (!) 145/84    Pulse: 94 89 (!) 112   Resp: 14 18 (!) 23 18  Temp: 97.7 F (36.5 C) 98.4 F (36.9 C)    TempSrc: Oral Oral    SpO2: 99% 99% 99%   Weight:      Height:        Intake/Output Summary (Last 24 hours) at 10/12/2021 1121 Last data filed at 10/12/2021 0742 Gross per 24 hour  Intake 240 ml  Output 500 ml  Net -260 ml    Filed Weights   10/24/2021 1649 10/09/21 0400 10/10/21 0335  Weight: 62.2 kg 66.2 kg 69.6 kg    General appearance: Awake alert.  In no distress.  Distracted Resp: Clear to auscultation bilaterally.  Normal effort Cardio: S1-S2 is irregularly irregular GI: Abdomen is soft.  Nontender nondistended.  Bowel sounds are present normal.  No masses organomegaly Extremities: No edema.  Moving all of her extremities Neurologic: No focal neurological deficits.     Lab Results:  Data Reviewed: I have personally reviewed following labs and reports of  the imaging studies  CBC: Recent Labs  Lab 10/06/2021 1054 10/09/21 0142 10/10/21 0910 10/11/21 0546  WBC 13.1* 10.7* 10.2 9.8  NEUTROABS 10.2*  --   --  7.3  HGB 11.1* 10.2* 11.4* 11.1*  HCT 35.7* 32.5* 35.9* 35.9*  MCV 94.2 93.4 91.8 93.2  PLT 347 331 376 366     Basic Metabolic Panel: Recent Labs  Lab 10/06/2021 1054 10/09/21 0142 10/10/21 0910 10/11/21 0546 10/12/21 0502  NA 139 140 138 139 140  K 4.1 3.9 3.5 4.4 4.0  CL 107 104 104 105 107  CO2 20* 23 22 19* 19*  GLUCOSE 135* 127* 94 97 98  BUN 25* 25* 21 25* 21  CREATININE 0.97 1.01* 0.89 0.85 0.78  CALCIUM 9.3 9.0 9.3 9.7 10.0  MG 1.6* 1.9  --   --   --      GFR: Estimated Creatinine Clearance: 58.2 mL/min (by C-G formula based on SCr of 0.78 mg/dL).  Liver Function Tests: Recent Labs  Lab 10/07/2021 1054  AST 19  ALT 21  ALKPHOS 152*  BILITOT 0.7  PROT 6.6  ALBUMIN 2.5*      Coagulation Profile: Recent Labs  Lab 10/20/2021 1004 10/27/2021 1054 10/11/21 0546  INR 1.4* 1.2 1.3*      CBG: Recent Labs  Lab 10/11/21 1621 10/11/21 1821 10/11/21 2201 10/12/21 0618 10/12/21 0749  GLUCAP 131* 132* 116* 106* 110*      Recent Results (from the past 240 hour(s))  Resp Panel by RT-PCR (Flu A&B, Covid) Anterior Nasal Swab     Status: None   Collection Time: 10/06/2021 10:42 AM   Specimen: Anterior Nasal Swab  Result Value Ref Range Status   SARS Coronavirus 2 by RT PCR NEGATIVE NEGATIVE Final    Comment: (NOTE) SARS-CoV-2 target nucleic acids are NOT DETECTED.  The SARS-CoV-2 RNA is generally detectable in upper respiratory specimens during the acute phase of infection. The lowest concentration of SARS-CoV-2 viral copies this assay can detect is 138 copies/mL. A negative result does not preclude SARS-Cov-2 infection and should  not be used as the sole basis for treatment or other patient management decisions. A negative result may occur with  improper specimen collection/handling, submission  of specimen other than nasopharyngeal swab, presence of viral mutation(s) within the areas targeted by this assay, and inadequate number of viral copies(<138 copies/mL). A negative result must be combined with clinical observations, patient history, and epidemiological information. The expected result is Negative.  Fact Sheet for Patients:  EntrepreneurPulse.com.au  Fact Sheet for Healthcare Providers:  IncredibleEmployment.be  This test is no t yet approved or cleared by the [place] FDA and  has been authorized for detection and/or diagnosis of SARS-CoV-2 by FDA under an Emergency Use Authorization (EUA). This EUA will remain  in effect (meaning this test can be used) for the duration of the COVID-19 declaration under Section 564(b)(1) of the Act, 21 U.S.C.section 360bbb-3(b)(1), unless the authorization is terminated  or revoked sooner.       Influenza A by PCR NEGATIVE NEGATIVE Final   Influenza B by PCR NEGATIVE NEGATIVE Final    Comment: (NOTE) The Xpert Xpress SARS-CoV-2/FLU/RSV plus assay is intended as an aid in the diagnosis of influenza from Nasopharyngeal swab specimens and should not be used as a sole basis for treatment. Nasal washings and aspirates are unacceptable for Xpert Xpress SARS-CoV-2/FLU/RSV testing.  Fact Sheet for Patients: EntrepreneurPulse.com.au  Fact Sheet for Healthcare Providers: IncredibleEmployment.be  This test is not yet approved or cleared by the [place] FDA and has been authorized for detection and/or diagnosis of SARS-CoV-2 by FDA under an Emergency Use Authorization (EUA). This EUA will remain in effect (meaning this test can be used) for the duration of the COVID-19 declaration under Section 564(b)(1) of the Act, 21 U.S.C. section 360bbb-3(b)(1), unless the authorization is terminated or revoked.  Performed at [facility], [place] [address],  [place], [place] [address]   Urine Culture     Status: Abnormal   Collection Time: 10/25/2021 10:42 AM   Specimen: In/Out Cath Urine  Result Value Ref Range Status   Specimen Description IN/OUT CATH URINE  Final   Special Requests   Final    NONE Performed at [facility], [place] [address], [place], [place] [address]    Culture (A)  Final    10,000 COLONIES/mL MULTIPLE SPECIES PRESENT, SUGGEST RECOLLECTION   Report Status 10/09/2021 FINAL  Final  Blood Culture (routine x 2)     Status: None (Preliminary result)   Collection Time: 10/18/2021 10:54 AM   Specimen: BLOOD RIGHT ARM  Result Value Ref Range Status   Specimen Description BLOOD RIGHT ARM  Final   Special Requests   Final    BOTTLES DRAWN AEROBIC AND ANAEROBIC Blood Culture adequate volume   Culture   Final    NO GROWTH 4 DAYS Performed at [facility], [place] [address] [place], [place] [address]    Report Status PENDING  Incomplete  Blood Culture (routine x 2)     Status: None (Preliminary result)   Collection Time: 10/29/2021 11:00 AM   Specimen: BLOOD LEFT ARM  Result Value Ref Range Status   Specimen Description BLOOD LEFT ARM  Final   Special Requests   Final    BOTTLES DRAWN AEROBIC ONLY Blood Culture results may not be optimal due to an inadequate volume of blood received in culture bottles   Culture   Final    NO GROWTH 4 DAYS Performed at [facility], [place] [address],  [place], [place] [address]    Report Status PENDING  Incomplete      Radiology Studies: No results found.     LOS: 4 days   Jeremiah Tarpley [employer] on [email or web address]  10/12/2021, 11:21 AM

## 2021-10-12 NOTE — Consult Note (Signed)
Chief Complaint: Patient was seen in consultation today for osteomyelitis at the request of Neurosurgery  Referring Physician(s): Ashok Pall  Supervising Physician: Sandi Mariscal  Patient Status: [facility] - In-pt  History of Present Illness: [name] is a 76 y.o. female with presumed osteomyelitis via imaging in need of L4-L5 disc aspiration. She originally presented as an outpatient with HR in the 180s last week and she was admitted through the ED for medical management.  Now rate has stabilized and aspiration is to be re-addressed.  Son [name] provides consent given her dementia.  She reports feeling well overall with mild low back pain today. Denies CP, SOB, abdominal pain, palpitations, h/a, dizziness, or nausea.  Past Medical History:  Diagnosis Date   Abnormal LFTs 07/24/2017   Actinic keratosis    Acute CVA (cerebrovascular accident) ([place])    Acute deep vein thrombosis (DVT) of left lower extremity ([place]) 05/06/2014   Anemia    hx of   Anemia of chronic renal failure, stage 3 (moderate) ([place]) 07/17/2015   Anticoagulant causing adverse effect in therapeutic use 07/17/2015   Anxiety    Arthritis    rheumatoid   Atrial fibrillation ([place])    a. persistent, she remains on Xarelto   Carpal tunnel syndrome    Chronic diastolic (congestive) heart failure ([place])    a. 04/2016: echo showing a preserved EF of 55-60% with mild MR. LA and RA midly dilated.    Edema    left leg at ankle resolved now   Gastroesophageal reflux disease    Hepatitis 1970   not sure what kind   Hiatal hernia    Hyperlipidemia    Hypertension    Iron deficiency anemia due to chronic blood loss 07/17/2015   Jaundice    age 80   Peripheral vascular disease ([place]) yrs ago   DVT left lower leg questionale told by 2 drs i had no clot, 1 md said i did   Rheumatoid arthritis(714.0)    Right knee pain    Stroke ([place])    Type II diabetes mellitus ([place])    type2    Past Surgical History:  Procedure  Laterality Date   CATARACT EXTRACTION Bilateral    COLONOSCOPY WITH PROPOFOL N/A 02/20/2015   Surgeon: Carol Ada, MD; nonbleeding cecal AVM s/p APC, 2 mm cecal polyp, transverse colon lipoma, diverticula.  No recommendations to repeat due to age and comorbidities. Pathology benign.   ENTEROSCOPY N/A 03/23/2016   Surgeon: Carol Ada, MD;  1 nonbleeding superficial gastric ulcer, normal duodenum, normal examined jejunum.   ENTEROSCOPY N/A 06/17/2016   Surgeon: Carol Ada, MD;  normal exam.   ESOPHAGOGASTRODUODENOSCOPY (EGD) WITH PROPOFOL N/A 02/20/2015   Surgeon: Carol Ada, MD; normal exam.  Biopsies taken from the duodenum. Pathology benign.   GIVENS CAPSULE STUDY N/A 03/21/2016   Procedure: GIVENS CAPSULE STUDY;  Surgeon: Carol Ada, MD;  active bleeding in the proximal small bowel   HAND SURGERY  1995 and 1996   artificial joints both hands   JOINT REPLACEMENT     LUMBAR LAMINECTOMY/DECOMPRESSION MICRODISCECTOMY N/A 02/10/2016   Procedure: MICROLUMBAR DECOMPRESSION L4-L5 AND L3- L4, AND EXCISION OF SYNOVIAL CYST L4-L5;  Surgeon: Susa Day, MD;  Location: [facility] ORS;  Service: Orthopedics;  Laterality: N/A;   TOTAL HIP ARTHROPLASTY Left 2009   TOTAL HIP ARTHROPLASTY Right 04/03/2013   Procedure: RIGHT TOTAL HIP ARTHROPLASTY ANTERIOR APPROACH;  Surgeon: Gearlean Alf, MD;  Location: [facility] ORS;  Service: Orthopedics;  Laterality:  Right;    Allergies: Phenergan [promethazine]  Medications: Prior to Admission medications   Medication Sig Start Date End Date Taking? Authorizing Provider  Abatacept (ORENCIA CLICKJECT) 932 MG/ML SOAJ Inject 125 mg into the skin once a week. Thursday   Yes [provider]  acetaminophen (TYLENOL) 650 MG CR tablet Take 1,300 mg by mouth 3 (three) times daily.   Yes [provider]  albuterol (VENTOLIN HFA) 108 (90 Base) MCG/ACT inhaler Inhale 2 puffs into the lungs every 4 (four) hours as needed for wheezing or shortness of breath.    Yes [provider]  Amino Acids-Protein Hydrolys (PRO-STAT) LIQD Take 30 mLs by mouth daily.   Yes [provider]  azelastine (OPTIVAR) 0.05 % ophthalmic solution Place 1 drop into both eyes 2 (two) times daily.   Yes [provider]  buPROPion ER (WELLBUTRIN SR) 100 MG 12 hr tablet Take 1 tablet (100 mg total) by mouth 2 (two) times daily. 07/30/21  Yes Nita Sells, MD  carboxymethylcellulose (ARTIFICIAL TEARS) 1 % ophthalmic solution Place 2 drops into both eyes daily.   Yes [provider]  cycloSPORINE (RESTASIS) 0.05 % ophthalmic emulsion Place 1 drop into both eyes 2 (two) times daily.   Yes [provider]  Diaper Rash Products (DESITIN MULTI-PURPOSE HEALING) OINT Apply 1 Application topically 3 (three) times daily. To inner buttocks   Yes [provider]  diclofenac Sodium (VOLTAREN) 1 % GEL Apply 2 g topically daily as needed for pain. To knees 03/15/20  Yes [provider]  diltiazem (CARDIZEM CD) 360 MG 24 hr capsule Take 1 capsule (360 mg total) by mouth daily. 07/31/21  Yes Nita Sells, MD  ferrous sulfate 325 (65 FE) MG tablet Take 1 tablet (325 mg total) by mouth 2 (two) times daily with a meal. 07/30/21  Yes Nita Sells, MD  fluticasone-salmeterol (ADVAIR HFA) 115-21 MCG/ACT inhaler Inhale 2 puffs into the lungs 2 times daily at 12 noon and 4 pm.   Yes [provider]  gabapentin (NEURONTIN) 100 MG capsule Take 1 capsule (100 mg total) by mouth 2 (two) times daily. 07/30/21  Yes Nita Sells, MD  insulin lispro (HUMALOG) 100 UNIT/ML injection Inject 6 Units into the skin in the morning and at bedtime. HOLD if CBG < 100   Yes [provider]  leflunomide (ARAVA) 20 MG tablet Take 20 mg by mouth daily.   Yes [provider]  magnesium oxide (MAG-OX) 400 MG tablet Take 400 mg by mouth 2 (two) times daily.   Yes [provider]  Menthol, Topical Analgesic,  (BIOFREEZE) 4 % GEL Apply 1 Application topically every 6 (six) hours as needed (pain).   Yes [provider]  metoprolol tartrate (LOPRESSOR) 50 MG tablet Take 1 tablet (50 mg total) by mouth 2 (two) times daily. 09/07/21  Yes Lelon Perla, MD  Multiple Vitamin (MULTIVITAMIN) tablet Take 1 tablet by mouth daily.   Yes [provider]  nitroGLYCERIN (NITROSTAT) 0.4 MG SL tablet Place 1 tablet (0.4 mg total) under the tongue every 5 (five) minutes as needed for chest pain (x 3 doses). 11/28/16  Yes Lelon Perla, MD  omeprazole (PRILOSEC) 20 MG capsule Take 20 mg by mouth in the morning.   Yes [provider]  OVER THE COUNTER MEDICATION Apply 1 Application topically every 12 (twelve) hours as needed (hydration of skin). To bilateral lower extremity. Sween Cream.   Yes [provider]  oxyCODONE HCl 7.5 MG TABS  Take 7.5 mg by mouth every 6 (six) hours.   Yes [provider]  predniSONE (DELTASONE) 5 MG tablet Take 5 mg by mouth daily.   Yes [provider]  saccharomyces boulardii (FLORASTOR) 250 MG capsule Take 250 mg by mouth daily.   Yes [provider]  vitamin C (ASCORBIC ACID) 500 MG tablet Take 500 mg by mouth 2 (two) times daily.   Yes [provider]  Zinc 50 MG TABS Take 50 mg by mouth daily.   Yes [provider]  ferrous gluconate (FERGON) 324 MG tablet Take 1 tablet (324 mg total) by mouth 2 (two) times daily with a meal. Patient not taking: Reported on 03/30/2020 05/17/16 04/01/20  Debbe Odea, MD  potassium chloride SA (K-DUR,KLOR-CON) 20 MEQ tablet Take 1 tablet (20 mEq total) by mouth 2 (two) times daily. Patient not taking: Reported on 03/30/2020 10/10/17 04/01/20  Kathie Dike, MD     Family History  Problem Relation Age of Onset   Pneumonia Mother 50   Stroke Father 64   Heart failure Sister    Hypertension Sister    Heart attack Neg Hx     Social History   Socioeconomic History    Marital status: Divorced    Spouse name: Not on file   Number of children: Not on file   Years of education: Not on file   Highest education level: Not on file  Occupational History   Occupation: Retired  Tobacco Use   Smoking status: Never   Smokeless tobacco: Never  Vaping Use   Vaping Use: Never used  Substance and Sexual Activity   Alcohol use: No    Alcohol/week: 0.0 standard drinks of alcohol   Drug use: No   Sexual activity: Not Currently  Other Topics Concern   Not on file  Social History Narrative   Not on file   Social Determinants of Health   Financial Resource Strain: Not on file  Food Insecurity: Not on file  Transportation Needs: Not on file  Physical Activity: Not on file  Stress: Not on file  Social Connections: Not on file    Review of Systems: Limited, refer to HPI above  Vital Signs: BP (!) 131/95 (BP Location: Left Arm)   Pulse 89   Temp 98.5 F (36.9 C) (Oral)   Resp 14   Ht '5\' 7"'$  (1.702 m)   Wt 153 lb 7 oz (69.6 kg)   SpO2 96%   BMI 24.03 kg/m   Physical Exam Constitutional:      General: She is not in acute distress. HENT:     Head: Normocephalic and atraumatic.     Mouth/Throat:     Mouth: Mucous membranes are moist.     Pharynx: Oropharynx is clear.  Eyes:     Extraocular Movements: Extraocular movements intact.  Cardiovascular:     Rate and Rhythm: Tachycardia present. Rhythm irregular.     Pulses: Normal pulses.     Heart sounds: Normal heart sounds.  Pulmonary:     Effort: Pulmonary effort is normal. No respiratory distress.  Abdominal:     General: Abdomen is flat. Bowel sounds are normal.     Palpations: Abdomen is soft.  Musculoskeletal:     Right lower leg: No edema.     Left lower leg: No edema.  Skin:    General: Skin is dry.     Coloration: Skin is pale.  Neurological:     General: No focal deficit present.  Mental Status: She is alert.  Psychiatric:        Mood and Affect: Mood normal.        Behavior:  Behavior normal.     Imaging: DG CHEST PORT 1 VIEW  Result Date: 10/09/2021 CLINICAL DATA:  Congestive heart failure EXAM: PORTABLE CHEST 1 VIEW COMPARISON:  10/20/2021 FINDINGS: Chronic cardiomegaly. Stable mediastinal contours with atheromatous calcification. Small pleural effusions and mild generalized interstitial coarsening. IMPRESSION: CHF pattern which is similar to prior. Electronically Signed   By: Jorje Guild M.D.   On: 10/09/2021 07:52   DG Chest Port 1 View  Result Date: 10/03/2021 CLINICAL DATA:  Atrial fibrillation.  Discitis-osteomyelitis. EXAM: PORTABLE CHEST 1 VIEW COMPARISON:  08/02/2021 FINDINGS: Defibrillator patches noted. Cephalization of blood flow with pulmonary vascular indistinctness and left greater than right interstitial accentuation. Fine Kerley B lines noted. Upper normal heart size. No substantial blunting of the costophrenic angles. Thoracic spondylosis.  Old healed left lateral rib deformities. IMPRESSION: 1. Asymmetric interstitial accentuation and vascular indistinctness, left greater than right, compatible with asymmetric pulmonary edema or possibly early aspiration pneumonitis on the left. Upper normal heart size. Electronically Signed   By: Van Clines M.D.   On: 10/05/2021 11:46   MR LUMBAR SPINE WO CONTRAST  Result Date: 10/06/2021 CLINICAL DATA:  Lumbar stenosis with neurogenic claudication M48.062 (ICD-10-CM). EXAM: MRI LUMBAR SPINE WITHOUT CONTRAST TECHNIQUE: Multiplanar, multisequence MR imaging of the lumbar spine was performed. No intravenous contrast was administered. COMPARISON:  MRI of the lumbar spine May 24, 2017. FINDINGS: Segmentation:  Standard. Alignment: Mild dextroconvex scoliosis. Grade 1 anterolisthesis of L4 over L5. Vertebrae: Endplate erosion at W9-7 with associated prominent marrow edema and increased T2 signal within the corresponding intervertebral disc concerning for discitis/osteomyelitis. The T2 hyperintensity within the  disc appear to extend to the anterior epidural region. Postsurgical changes from L4-5 laminectomy. Conus medullaris and cauda equina: Conus extends to the T12-L1 level. Conus appear normal. Paraspinal and other soft tissues: Partially evaluated septated cystic appearing lesion in the right psoas muscle measuring at least 4.3 x 3.2 cm, most consistent with an abscess in the setting of discitis/osteomyelitis. Disc levels: T12-L1: No spinal canal or neural foraminal stenosis. L1-2: Loss of disc height, disc bulge with superimposed inferiorly migrating left central to subarticular disc extrusion, moderate facet degenerative changes and ligamentum flavum redundancy. Findings result in moderate spinal canal stenosis with effacement of the left subarticular zone, mild right and moderate left neural foraminal narrowing. Findings are new since prior MRI. L2-3: Disc bulge, moderate facet degenerative change ligamentum flavum redundancy resulting in mild spinal canal stenosis. No significant neural foraminal narrowing. L3-4: Loss of disc height, disc bulge with superimposed superiorly migrating left central to subarticular disc extrusion, advanced facet degenerative changes and ligamentum flavum redundancy resulting in severe spinal canal stenosis and moderate left neural foraminal narrowing. L4-5: T2 hyperintensity within the disc space, as described above, protruding into the spinal canal. Advanced facet degenerative changes. Findings result in mild spinal canal stenosis with prominent narrowing of the bilateral subarticular zones, moderate right and mild left neural foraminal narrowing. L5-S1: Disc bulge and advanced facet degenerative changes resulting mild bilateral neural foraminal narrowing. No significant spinal canal stenosis. IMPRESSION: 1. Findings more consistent with discitis/osteomyelitis at L4-5 with fluid signal within the disc space protruding into the anterior epidural space. Associated right psoas abscess  measuring at least 4.3 x 3.2 cm, only partially evaluated. 2. Degenerative changes of the lumbar spine with severe spinal canal stenosis at L3-4  and moderate at L1-2. These results will be called to the ordering clinician or representative by the Radiologist Assistant, and communication documented in the PACS or [employer]. Electronically Signed   By: Pedro Earls M.D.   On: 10/06/2021 15:27    Labs:  CBC: Recent Labs    10/20/2021 1054 10/09/21 0142 10/10/21 0910 10/11/21 0546  WBC 13.1* 10.7* 10.2 9.8  HGB 11.1* 10.2* 11.4* 11.1*  HCT 35.7* 32.5* 35.9* 35.9*  PLT 347 331 376 366    COAGS: Recent Labs    07/21/21 1700 07/21/21 2003 07/22/21 0601 08/02/21 1626 08/03/21 0341 10/06/2021 1004 10/14/2021 1054 10/11/21 0546  INR  --   --   --    < > 1.2 1.4* 1.2 1.3*  APTT 35 36 47*  --   --   --  30  --    < > = values in this interval not displayed.    BMP: Recent Labs    10/09/21 0142 10/10/21 0910 10/11/21 0546 10/12/21 0502  NA 140 138 139 140  K 3.9 3.5 4.4 4.0  CL 104 104 105 107  CO2 23 22 19* 19*  GLUCOSE 127* 94 97 98  BUN 25* 21 25* 21  CALCIUM 9.0 9.3 9.7 10.0  CREATININE 1.01* 0.89 0.85 0.78  GFRNONAA 58* >60 >60 >60    LIVER FUNCTION TESTS: Recent Labs    07/29/21 0316 08/02/21 1626 08/03/21 0341 10/04/2021 1054  BILITOT 0.5 0.4 0.6 0.7  AST 16 19 14* 19  ALT '14 19 13 21  '$ ALKPHOS 182* 229* 172* 152*  PROT 4.9* 6.9 5.4* 6.6  ALBUMIN <1.5* 2.4* 1.9* 2.5*    Assessment and Plan:  Back pain --presumed osteomyelitis --now medically stable --for L4-L5 disc aspiration with Deveshwar, probable for tomorrow --NPO and lab orders placed  Risks and benefits of disc aspiration was discussed with the patient and/or patient's family including, but not limited to bleeding, infection, damage to adjacent structures or low yield requiring additional tests.  All of the questions were answered and there is agreement to proceed.  Consent  signed and in chart.   Thank you for this interesting consult.  I greatly enjoyed meeting Jeremie L Mccambridge and look forward to participating in their care.  A copy of this report was sent to the requesting provider on this date.  Electronically Signed: Pasty Spillers, PA 10/12/2021, 5:30 PM   I spent a total of 40 Minutes in face to face in clinical consultation, greater than 50% of which was counseling/coordinating care for disc aspiration

## 2021-10-12 NOTE — TOC Progression Note (Signed)
Transition of Care [facility]) - Progression Note    Patient Details  Name: [name] MRN: [id number] Date of Birth: 09-27-1945  [facility] CM/SW [place], [place] Phone Number: 10/12/2021, 12:25 PM  Clinical Narrative:     CSW notified that son has not been able to be reached and that he is need to consent to a procedure. CSW contacted [employer] to obtain contact info for son. [facility] liaison provided number they have for son, [phone].   CSW called number and pt's son [name] answered. Explained that consent is needed for a procedure and that medical staff would call him regarding this. CSW confirmed with Fara Olden that the numbers listed for him in the chart were old phone numbers. CSW will remove old numbers and add his correct phone number in chart.   Expected Discharge Plan: [place] Barriers to Discharge: Continued Medical Work up, SNF Pending bed offer  Expected Discharge Plan and Services Expected Discharge Plan: [place] In-house Referral: Clinical Social Work   Post Acute Care Choice: [place] Living arrangements for the past 2 months: [place]                                       Social Determinants of Health (SDOH) Interventions    Readmission Risk Interventions    08/03/2021   11:07 AM 07/26/2021    2:12 PM 06/29/2021   11:03 AM  Readmission Risk Prevention Plan  Transportation Screening Complete Complete Complete  Medication Review [employer]) Complete  Complete  HRI or Home Care Consult Complete Complete Complete  SW Recovery Care/Counseling Consult Complete Complete Complete  Palliative Care Screening Not Applicable  Complete  Skilled Nursing Facility Complete Complete Complete

## 2021-10-13 ENCOUNTER — Inpatient Hospital Stay (HOSPITAL_COMMUNITY): Payer: 59

## 2021-10-13 DIAGNOSIS — I4891 Unspecified atrial fibrillation: Secondary | ICD-10-CM | POA: Diagnosis not present

## 2021-10-13 HISTORY — PX: IR LUMBAR DISC ASPIRATION W/IMG GUIDE: IMG5306

## 2021-10-13 LAB — CULTURE, BLOOD (ROUTINE X 2)
Culture: NO GROWTH
Culture: NO GROWTH
Special Requests: ADEQUATE

## 2021-10-13 LAB — BASIC METABOLIC PANEL
Anion gap: 15 (ref 5–15)
BUN: 27 mg/dL — ABNORMAL HIGH (ref 8–23)
CO2: 18 mmol/L — ABNORMAL LOW (ref 22–32)
Calcium: 9.9 mg/dL (ref 8.9–10.3)
Chloride: 106 mmol/L (ref 98–111)
Creatinine, Ser: 0.97 mg/dL (ref 0.44–1.00)
GFR, Estimated: >60 mL/min (ref >60)
Glucose, Bld: 121 mg/dL — ABNORMAL HIGH (ref 70–99)
Potassium: 4.2 mmol/L (ref 3.5–5.1)
Sodium: 139 mmol/L (ref 135–145)

## 2021-10-13 LAB — GLUCOSE, CAPILLARY
Glucose-Capillary: 204 mg/dL — ABNORMAL HIGH (ref 70–99)
Glucose-Capillary: 116 mg/dL — ABNORMAL HIGH (ref 70–99)
Glucose-Capillary: 96 mg/dL (ref 70–99)
Glucose-Capillary: 155 mg/dL — ABNORMAL HIGH (ref 70–99)

## 2021-10-13 LAB — CBC
HCT: 37.8 % (ref 36.0–46.0)
Hemoglobin: 12 g/dL (ref 12.0–15.0)
MCH: 28.9 pg (ref 26.0–34.0)
MCHC: 31.7 g/dL (ref 30.0–36.0)
MCV: 91.1 fL (ref 80.0–100.0)
Platelets: 368 10*3/uL (ref 150–400)
RBC: 4.15 MIL/uL (ref 3.87–5.11)
RDW: 15.7 % — ABNORMAL HIGH (ref 11.5–15.5)
WBC: 9.6 10*3/uL (ref 4.0–10.5)
nRBC: 0 % (ref 0.0–0.2)

## 2021-10-13 LAB — PROTIME-INR
INR: 1.3 — ABNORMAL HIGH (ref 0.8–1.2)
Prothrombin Time: 15.9 seconds — ABNORMAL HIGH (ref 11.4–15.2)

## 2021-10-13 MED ORDER — FENTANYL CITRATE (PF) 100 MCG/2ML IJ SOLN
INTRAMUSCULAR | Status: DC | PRN
  Administered 2021-10-13: 12.5 ug via INTRAVENOUS
  Administered 2021-10-13: 25 ug via INTRAVENOUS
  Administered 2021-10-13: 12.5 ug via INTRAVENOUS

## 2021-10-13 MED ORDER — BUPIVACAINE HCL (PF) 0.5 % IJ SOLN
INTRAMUSCULAR | Status: DC | PRN
  Administered 2021-10-13: 10 mL

## 2021-10-13 MED ORDER — MIDAZOLAM HCL 2 MG/2ML IJ SOLN
INTRAMUSCULAR | Status: DC | PRN
  Administered 2021-10-13: .5 mg via INTRAVENOUS
  Administered 2021-10-13: 1 mg via INTRAVENOUS
  Administered 2021-10-13: .5 mg via INTRAVENOUS

## 2021-10-13 MED ORDER — BUPIVACAINE HCL (PF) 0.5 % IJ SOLN
INTRAMUSCULAR | Status: AC
  Filled 2021-10-13: qty 30

## 2021-10-13 MED ORDER — FENTANYL CITRATE (PF) 100 MCG/2ML IJ SOLN
INTRAMUSCULAR | Status: AC
  Filled 2021-10-13: qty 4

## 2021-10-13 MED ORDER — SODIUM CHLORIDE 0.9 % IV BOLUS
250.0000 mL | INTRAVENOUS | Status: AC
  Administered 2021-10-13: 250 mL via INTRAVENOUS

## 2021-10-13 MED ORDER — MIDAZOLAM HCL 2 MG/2ML IJ SOLN
INTRAMUSCULAR | Status: AC
  Filled 2021-10-13: qty 4

## 2021-10-13 MED ORDER — METOPROLOL TARTRATE 5 MG/5ML IV SOLN
5.0000 mg | INTRAVENOUS | Status: AC
  Administered 2021-10-13: 5 mg via INTRAVENOUS
  Filled 2021-10-13: qty 5

## 2021-10-13 NOTE — Progress Notes (Signed)
Pt maxed on cardizem gtt for the unit. Pt's hr still reaching 150s-160s, but maintaining in 120s-130s. Pt only took roughly 1/2 of her medications that were crushed in applesauce, the other she spit out. On-call MD notified.

## 2021-10-13 NOTE — Progress Notes (Signed)
PROGRESS NOTE    [name]  CLE:[id number]  DOB: 1945/08/15  DOA: 10/20/2021 PCP: Aletha Halim., PA-C Outpatient Specialists:   Hospital course: 76 y/o, with multiple co-morbidities including RA, DM, HFpEF, permanent a. Fib with episodes of RVR when sick, not a candidate for anticoagulation 2/2 h/o GI bleed from AVM, chronic blood loss anemia, CKD II, h/o CVA, Dementia, multiple pressure wounds and diabetic foot ulcers has had increasing back pain for several weeks. She is followed by Dr. Annette Stable with Neurosurgery. She had an MRI lumbar 10/06/21 revealing L4-5 discitis/osteomyelitis and a right psoas abscess. She was in IR dept for aspiration bx of lumbar lesion when she experienced atrial fibrillation with RVR.    Patient was admitted and started on vancomycin cefepime and metronidazole.  She is scheduled to have psoas abscess aspiration by IR today.    Subjective:  Patient states she would like to know when her surgery is going to be today.  She has no other complaints.  Objective: Vitals:   10/13/21 1430 10/13/21 1435 10/13/21 1440 10/13/21 1445  BP: 117/85 109/86 110/73 121/88  Pulse: 82 73 75 92  Resp: (!) 25 (!) 29 (!) 24 (!) 25  Temp:      TempSrc:      SpO2: 90% 97% 98% 98%  Weight:      Height:        Intake/Output Summary (Last 24 hours) at 10/13/2021 1453 Last data filed at 10/13/2021 0600 Gross per 24 hour  Intake 1076.78 ml  Output 125 ml  Net 951.78 ml   Filed Weights   10/09/21 0400 10/10/21 0335 10/12/21 2041  Weight: 66.2 kg 69.6 kg 66 kg     Exam:  General: Patient appears comfortable lying in bed, she is conversant although clearly has memory problems. Eyes: sclera anicteric, conjuctiva mild injection bilaterally CVS: S1-S2, regular  Respiratory:  decreased air entry bilaterally secondary to decreased inspiratory effort, rales at bases  GI: NABS, soft, NT  LE: No edema.    Assessment & Plan:   Lumbar discitis/right psoas abscess Plan  is for psoas abscess aspiration today Continue vancomycin cefepime and metronidazole Being followed by neurosurgery  Persistent atrial fibrillation with RVR Patient is known to have RVR when she gets sick Patient is continued on diltiazem drip at least until her psoas abscess/disc has been aspirated No anticoagulation due to history of significant GI bleed  HFpEF Appears euvolemic  RA Continue prednisone Arava being held  DM 2 Continue present management  Dementia Patient is conversant and appropriate although clearly has memory deficits History of delirium/agitation and previous hospitalization, as needed Haldol was ordered    Copied from previous note:  Right buttock decubitus stage III Pressure Injury 10/09/2021 Buttocks Right;Upper Stage 3 -  Full thickness tissue loss. Subcutaneous fat may be visible but bone, tendon or muscle are NOT exposed. (Active)  10/31/2021   Location: Buttocks  Location Orientation: Right;Upper  Staging: Stage 3 -  Full thickness tissue loss. Subcutaneous fat may be visible but bone, tendon or muscle are NOT exposed.  Wound Description (Comments):   Present on Admission: Yes    DVT prophylaxis: Lovenox Code Status: DNR Family Communication: None today Disposition Plan:   Patient is from: [facility] [address]  Anticipated Discharge Location: [place]  Barriers to Discharge: Undergoing work-up for discitis  Is patient medically stable for Discharge: No   Scheduled Meds:  (feeding supplement) PROSource Plus  30 mL Oral Daily   acetaminophen  975  mg Oral TID   bupivacaine(PF)       buPROPion ER  100 mg Oral BID   cycloSPORINE  1 drop Both Eyes BID   diltiazem  360 mg Oral Daily   enoxaparin (LOVENOX) injection  40 mg Subcutaneous Q24H   ferrous sulfate  325 mg Oral BID WC   gabapentin  100 mg Oral BID   insulin aspart  0-20 Units Subcutaneous TID WC   ketotifen  1 drop Both Eyes BID   leflunomide  20 mg Oral Daily   magnesium oxide  400 mg  Oral BID   metoprolol tartrate  100 mg Oral BID   metroNIDAZOLE  500 mg Oral Q12H   mometasone-formoterol  2 puff Inhalation BID   oxyCODONE  7.5 mg Oral Q6H   pantoprazole  40 mg Oral BID   polyvinyl alcohol  2 drop Both Eyes Daily   predniSONE  5 mg Oral Daily   saccharomyces boulardii  250 mg Oral Daily   Continuous Infusions:  ceFEPime (MAXIPIME) IV 2 g (10/13/21 1116)   diltiazem (CARDIZEM) infusion 5 mg/hr (10/12/21 2050)   vancomycin 1,250 mg (10/13/21 1214)    Data Reviewed:  Basic Metabolic Panel: Recent Labs  Lab 10/07/2021 1054 10/09/21 0142 10/10/21 0910 10/11/21 0546 10/12/21 0502 10/13/21 0135  NA 139 140 138 139 140 139  K 4.1 3.9 3.5 4.4 4.0 4.2  CL 107 104 104 105 107 106  CO2 20* 23 22 19* 19* 18*  GLUCOSE 135* 127* 94 97 98 121*  BUN 25* 25* 21 25* 21 27*  CREATININE 0.97 1.01* 0.89 0.85 0.78 0.97  CALCIUM 9.3 9.0 9.3 9.7 10.0 9.9  MG 1.6* 1.9  --   --   --   --     CBC: Recent Labs  Lab 10/27/2021 1054 10/09/21 0142 10/10/21 0910 10/11/21 0546 10/13/21 0135  WBC 13.1* 10.7* 10.2 9.8 9.6  NEUTROABS 10.2*  --   --  7.3  --   HGB 11.1* 10.2* 11.4* 11.1* 12.0  HCT 35.7* 32.5* 35.9* 35.9* 37.8  MCV 94.2 93.4 91.8 93.2 91.1  PLT 347 331 376 366 368    Studies: No results found.  Principal Problem:   Atrial fibrillation with RVR (HCC) Active Problems:   A-fib (HCC)   Septic discitis of lumbar region   Psoas abscess, right (HCC)   Type 2 diabetes mellitus with hyperlipidemia (HCC)   Rheumatoid arthritis (HCC)   Chronic diastolic (congestive) heart failure (HCC)   Pressure injury of skin   Dementia with agitation ([place])   Hypertension   Gastroesophageal reflux disease   Hypomagnesemia     [name] [name], [name]  If 7PM-7AM, please contact night-coverage [email or web address]   LOS: 5 days

## 2021-10-13 NOTE — Procedures (Signed)
INR. Status post L4-L5 disc space fluoroscopic guided aspiration biopsy.  Approximately 1 cc of thick bloodstained aspirate obtained and sent for microbiologic analysis.  No acute complications.  Arlean Hopping MD.

## 2021-10-13 NOTE — Progress Notes (Signed)
OT Cancellation Note  Patient Details Name: [name] MRN: [id number] DOB: [birth year]   Cancelled Treatment:    Reason Eval/Treat Not Completed: Patient at procedure or test/ unavailable. Pt off the floor at this time, OT will follow up as schedule allows.   Tavious Griesinger Elane Deandre Stansel 10/13/2021, 3:17 PM

## 2021-10-13 NOTE — Progress Notes (Signed)
Patient has been placed in safety mits overnight after being awake, yelling, and pulling at lines. She pulled one IV out, removed sacral dressing and scratched left arm breaking her skin. Foam dressing applied to left arm. New IV site secured with kerlex.

## 2021-10-13 NOTE — Sedation Documentation (Signed)
Pt transported to [place] on bed with transport and this RN. Pt on cardiac monitoring. When pt got into room, This RN connected pt to the BP monitor and pulse ox monitor, and pt is still on cardiac monitor. Nurse tech came in the room as well. Pt resting with eyes closed

## 2021-10-13 NOTE — Care Management Important Message (Signed)
Important Message  Patient Details  Name: [name] MRN: [id number] Date of Birth: [birth year]   Medicare Important Message Given:  Yes     Shelda Altes 10/13/2021, 8:29 AM

## 2021-10-14 DIAGNOSIS — I4891 Unspecified atrial fibrillation: Secondary | ICD-10-CM | POA: Diagnosis not present

## 2021-10-14 LAB — CBC
HCT: 45 % (ref 36.0–46.0)
Hemoglobin: 13.9 g/dL (ref 12.0–15.0)
MCH: 28.5 pg (ref 26.0–34.0)
MCHC: 30.9 g/dL (ref 30.0–36.0)
MCV: 92.4 fL (ref 80.0–100.0)
Platelets: 376 10*3/uL (ref 150–400)
RBC: 4.87 MIL/uL (ref 3.87–5.11)
RDW: 15.6 % — ABNORMAL HIGH (ref 11.5–15.5)
WBC: 10.6 10*3/uL — ABNORMAL HIGH (ref 4.0–10.5)
nRBC: 0 % (ref 0.0–0.2)

## 2021-10-14 LAB — BASIC METABOLIC PANEL
Anion gap: 14 (ref 5–15)
BUN: 22 mg/dL (ref 8–23)
CO2: 15 mmol/L — ABNORMAL LOW (ref 22–32)
Calcium: 10 mg/dL (ref 8.9–10.3)
Chloride: 108 mmol/L (ref 98–111)
Creatinine, Ser: 0.88 mg/dL (ref 0.44–1.00)
GFR, Estimated: >60 mL/min (ref >60)
Glucose, Bld: 85 mg/dL (ref 70–99)
Potassium: 3.9 mmol/L (ref 3.5–5.1)
Sodium: 137 mmol/L (ref 135–145)

## 2021-10-14 LAB — VANCOMYCIN, PEAK: Vancomycin Pk: 40 ug/mL (ref 30–40)

## 2021-10-14 LAB — GLUCOSE, CAPILLARY
Glucose-Capillary: 82 mg/dL (ref 70–99)
Glucose-Capillary: 77 mg/dL (ref 70–99)
Glucose-Capillary: 110 mg/dL — ABNORMAL HIGH (ref 70–99)
Glucose-Capillary: 123 mg/dL — ABNORMAL HIGH (ref 70–99)

## 2021-10-14 MED ORDER — SODIUM CHLORIDE 0.9 % IV SOLN: INTRAVENOUS | Status: AC

## 2021-10-14 MED ORDER — METOPROLOL TARTRATE 5 MG/5ML IV SOLN
5.0000 mg | Freq: Four times a day (QID) | INTRAVENOUS | Status: DC
  Administered 2021-10-14 – 2021-10-15 (×3): 5 mg via INTRAVENOUS
  Filled 2021-10-14 (×4): qty 5

## 2021-10-14 MED ORDER — SODIUM CHLORIDE 0.45 % IV SOLN: INTRAVENOUS | Status: AC

## 2021-10-14 NOTE — Progress Notes (Addendum)
PROGRESS NOTE    [name]  KDT:[id number]  DOB: 15-Jul-1945  DOA: 10/30/2021 PCP: Aletha Halim., PA-C Outpatient Specialists:   Hospital course: 76 y/o, with multiple co-morbidities including RA, DM, HFpEF, permanent a. Fib with episodes of RVR when sick, not a candidate for anticoagulation 2/2 h/o GI bleed from AVM, chronic blood loss anemia, CKD II, h/o CVA, Dementia, multiple pressure wounds and diabetic foot ulcers has had increasing back pain for several weeks. She is followed by Dr. Annette Stable with Neurosurgery. She had an MRI lumbar 10/06/21 revealing L4-5 discitis/osteomyelitis and a right psoas abscess. She was in IR dept for aspiration bx of lumbar lesion when she experienced atrial fibrillation with RVR.    Patient was admitted and started on vancomycin cefepime and metronidazole.  She is scheduled to have psoas abscess aspiration by IR today.    Subjective:  Patient is somewhat lethargic and confused today.  She does however wake up to voice alone and says she is not in any pain.  She admits that she is feeling more confused than usual.  Objective: Vitals:   10/13/21 2320 10/14/21 0440 10/14/21 0859 10/14/21 1100  BP: 139/90 (!) 119/54  140/86  Pulse: (!) 140 (!) 103  (!) 112  Resp: '19 20  19  '$ Temp: 97.9 F (36.6 C) 98.1 F (36.7 C)  98.3 F (36.8 C)  TempSrc: Axillary Axillary  Axillary  SpO2: 94% 95% 95%   Weight:  64.9 kg    Height:        Intake/Output Summary (Last 24 hours) at 10/14/2021 1712 Last data filed at 10/14/2021 0826 Gross per 24 hour  Intake 1520.34 ml  Output --  Net 1520.34 ml    Filed Weights   10/10/21 0335 10/12/21 2041 10/14/21 0440  Weight: 69.6 kg 66 kg 64.9 kg     Exam:  General: Patient appears comfortable lying in bed, she is conversant although clearly has memory problems. Eyes: sclera anicteric, conjuctiva mild injection bilaterally CVS: S1-S2, regular  Respiratory:  decreased air entry bilaterally secondary to  decreased inspiratory effort, rales at bases  GI: NABS, soft, NT  LE: No edema.    Assessment & Plan:   Encephalopathy complicating baseline dementia Patient is encephalopathic today possibly secondary to Dilaudid given overnight for pain after biopsy, although patient has a history of agitation and delirium from previous hospitalization. Continue Haldol for management of agitation Avoid benzodiazepines, delirium precautions to avoid falls and aspiration. Gentle IVF overnight given decreased p.o. intake  Worsened RVR overnight  Heart rate is ranging from 100-130 despite being maxed out on diltiazem drip Of note patient is not been able to take her oral metoprolol due to altered mental status Per RN, patient's heart rate decreases with Dilaudid so some of this appears to be pain mediated. Patient unable to take oral metoprolol, will change to IV metoprolol 5 every 6 No anticoagulation due to history of significant GI bleed  Lumbar discitis/right psoas abscess S/p psoas abscess aspiration--cultures pending Continue vancomycin cefepime and metronidazole Being followed by neurosurgery This was supposed be done as an outpatient, potentially patient can be discharged once mental status clears for outpatient therapy.  HFpEF Appears euvolemic  RA Continue prednisone Arava being held  DM 2 Continue present management    Copied from previous note:  Right buttock decubitus stage III Pressure Injury 10/30/2021 Buttocks Right;Upper Stage 3 -  Full thickness tissue loss. Subcutaneous fat may be visible but bone, tendon or muscle are NOT exposed. (  Active)  10/13/2021   Location: Buttocks  Location Orientation: Right;Upper  Staging: Stage 3 -  Full thickness tissue loss. Subcutaneous fat may be visible but bone, tendon or muscle are NOT exposed.  Wound Description (Comments):   Present on Admission: Yes    DVT prophylaxis: Lovenox Code Status: DNR Family Communication: None  today Disposition Plan:   Patient [facility]: [facility]  Anticipated Discharge Location: [facility]  Barriers to Discharge: Undergoing work-up for discitis  Is patient medically stable for Discharge: No   Scheduled Meds:  (feeding supplement) PROSource Plus  30 mL Oral Daily   acetaminophen  975 mg Oral TID   buPROPion ER  100 mg Oral BID   cycloSPORINE  1 drop Both Eyes BID   diltiazem  360 mg Oral Daily   enoxaparin (LOVENOX) injection  40 mg Subcutaneous Q24H   ferrous sulfate  325 mg Oral BID WC   gabapentin  100 mg Oral BID   insulin aspart  0-20 Units Subcutaneous TID WC   ketotifen  1 drop Both Eyes BID   leflunomide  20 mg Oral Daily   magnesium oxide  400 mg Oral BID   metoprolol tartrate  100 mg Oral BID   metroNIDAZOLE  500 mg Oral Q12H   mometasone-formoterol  2 puff Inhalation BID   oxyCODONE  7.5 mg Oral Q6H   pantoprazole  40 mg Oral BID   polyvinyl alcohol  2 drop Both Eyes Daily   predniSONE  5 mg Oral Daily   saccharomyces boulardii  250 mg Oral Daily   Continuous Infusions:  ceFEPime (MAXIPIME) IV 2 g (10/14/21 1124)   diltiazem (CARDIZEM) infusion 15 mg/hr (10/14/21 1641)   vancomycin 1,250 mg (10/14/21 1220)    Data Reviewed:  Basic Metabolic Panel: Recent Labs  Lab 10/06/2021 1054 10/09/21 0142 10/10/21 0910 10/11/21 0546 10/12/21 0502 10/13/21 0135 10/14/21 0255  NA 139 140 138 139 140 139 137  K 4.1 3.9 3.5 4.4 4.0 4.2 3.9  CL 107 104 104 105 107 106 108  CO2 20* 23 22 19* 19* 18* 15*  GLUCOSE 135* 127* 94 97 98 121* 85  BUN 25* 25* 21 25* 21 27* 22  CREATININE 0.97 1.01* 0.89 0.85 0.78 0.97 0.88  CALCIUM 9.3 9.0 9.3 9.7 10.0 9.9 10.0  MG 1.6* 1.9  --   --   --   --   --      CBC: Recent Labs  Lab 10/31/2021 1054 10/09/21 0142 10/10/21 0910 10/11/21 0546 10/13/21 0135 10/14/21 0255  WBC 13.1* 10.7* 10.2 9.8 9.6 10.6*  NEUTROABS 10.2*  --   --  7.3  --   --   HGB 11.1* 10.2* 11.4* 11.1* 12.0 13.9  HCT 35.7* 32.5* 35.9* 35.9*  37.8 45.0  MCV 94.2 93.4 91.8 93.2 91.1 92.4  PLT 347 331 376 366 368 376     Studies: No results found.  Principal Problem:   Atrial fibrillation with RVR (HCC) Active Problems:   A-fib (HCC)   Septic discitis of lumbar region   Psoas abscess, right (HCC)   Type 2 diabetes mellitus with hyperlipidemia (HCC)   Rheumatoid arthritis (HCC)   Chronic diastolic (congestive) heart failure (HCC)   Pressure injury of skin   Dementia with agitation ([place])   Hypertension   Gastroesophageal reflux disease   Hypomagnesemia     Jamison Soward Tublu Rasheedah Reis, [facility]  If 7PM-7AM, please contact night-coverage [email or web address]   LOS: 6 days

## 2021-10-14 NOTE — Plan of Care (Signed)
  Problem: Health Behavior/Discharge Planning: Goal: Ability to identify and utilize available resources and services will improve Outcome: Not Progressing Goal: Ability to manage health-related needs will improve Outcome: Not Progressing   Problem: Nutritional: Goal: Progress toward achieving an optimal weight will improve Outcome: Not Progressing   Problem: Skin Integrity: Goal: Risk for impaired skin integrity will decrease Outcome: Not Progressing

## 2021-10-15 ENCOUNTER — Other Ambulatory Visit: Payer: Self-pay | Admitting: Physician Assistant

## 2021-10-15 DIAGNOSIS — M4640 Discitis, unspecified, site unspecified: Secondary | ICD-10-CM | POA: Diagnosis not present

## 2021-10-15 DIAGNOSIS — L89302 Pressure ulcer of unspecified buttock, stage 2: Secondary | ICD-10-CM

## 2021-10-15 DIAGNOSIS — A419 Sepsis, unspecified organism: Secondary | ICD-10-CM

## 2021-10-15 DIAGNOSIS — F01B11 Vascular dementia, moderate, with agitation: Secondary | ICD-10-CM

## 2021-10-15 DIAGNOSIS — E1169 Type 2 diabetes mellitus with other specified complication: Secondary | ICD-10-CM

## 2021-10-15 DIAGNOSIS — I4891 Unspecified atrial fibrillation: Secondary | ICD-10-CM | POA: Diagnosis not present

## 2021-10-15 DIAGNOSIS — E785 Hyperlipidemia, unspecified: Secondary | ICD-10-CM

## 2021-10-15 LAB — BASIC METABOLIC PANEL
Anion gap: 18 — ABNORMAL HIGH (ref 5–15)
BUN: 19 mg/dL (ref 8–23)
CO2: 15 mmol/L — ABNORMAL LOW (ref 22–32)
Calcium: 10 mg/dL (ref 8.9–10.3)
Chloride: 105 mmol/L (ref 98–111)
Creatinine, Ser: 0.74 mg/dL (ref 0.44–1.00)
GFR, Estimated: >60 mL/min (ref >60)
Glucose, Bld: 100 mg/dL — ABNORMAL HIGH (ref 70–99)
Potassium: 3.5 mmol/L (ref 3.5–5.1)
Sodium: 138 mmol/L (ref 135–145)

## 2021-10-15 LAB — GLUCOSE, CAPILLARY
Glucose-Capillary: 131 mg/dL — ABNORMAL HIGH (ref 70–99)
Glucose-Capillary: 119 mg/dL — ABNORMAL HIGH (ref 70–99)
Glucose-Capillary: 120 mg/dL — ABNORMAL HIGH (ref 70–99)
Glucose-Capillary: 120 mg/dL — ABNORMAL HIGH (ref 70–99)
Glucose-Capillary: 122 mg/dL — ABNORMAL HIGH (ref 70–99)

## 2021-10-15 LAB — BRAIN NATRIURETIC PEPTIDE: B Natriuretic Peptide: 217.2 pg/mL — ABNORMAL HIGH (ref 0.0–100.0)

## 2021-10-15 MED ORDER — SODIUM CHLORIDE 0.9 % IV SOLN
8.0000 mg/kg | Freq: Every day | INTRAVENOUS | Status: DC
  Administered 2021-10-15: 450 mg via INTRAVENOUS
  Filled 2021-10-15 (×2): qty 9

## 2021-10-15 MED ORDER — MEROPENEM 1 G IV SOLR
1.0000 g | Freq: Two times a day (BID) | INTRAVENOUS | Status: DC
  Administered 2021-10-15 (×2): 1 g via INTRAVENOUS
  Filled 2021-10-15 (×3): qty 20

## 2021-10-15 MED ORDER — METOPROLOL TARTRATE 5 MG/5ML IV SOLN
10.0000 mg | Freq: Four times a day (QID) | INTRAVENOUS | Status: DC
Start: 2021-10-15 — End: 2021-10-16
  Administered 2021-10-15 – 2021-10-16 (×4): 10 mg via INTRAVENOUS
  Filled 2021-10-15 (×4): qty 10

## 2021-10-15 MED ORDER — DIGOXIN 0.25 MG/ML IJ SOLN
0.2500 mg | Freq: Once | INTRAMUSCULAR | Status: AC
  Administered 2021-10-15: 0.25 mg via INTRAVENOUS
  Filled 2021-10-15: qty 2

## 2021-10-15 NOTE — Progress Notes (Signed)
Patient nonverbal, not following commands, restless in the bed. She will not take her oral medications. '10mg'$  of lopressor IV given as ordered, heart rate still remains in the 130s. Dr. Alanda Amass made aware.

## 2021-10-15 NOTE — Progress Notes (Signed)
Physical Therapy Treatment Patient Details Name: [name] MRN: [id number] DOB: Jan 20, 1946 Today's Date: 10/15/2021   History of Present Illness Pt is 76 yo female who presents to [facility] on 10/15/2021 with Afib with RVR which occurred during aspiration biopsy of lumbar lesion after MRI on 7/5 revealed L4-5 discitis/ osteomyelitis and R psoas abscess. PMH: RA, DM, CHF, permanent Afib with episodes of RVR when sick/ septic,, GI bleed, anemia, CKD2, CVA, dementia, multiple pressure wounds and foot ulcers.    PT Comments    Pt limited by lethargy and impaired cognition. Pt does not follow commands or respond to PT conversation. Pt appears very tense, in fetal position upon PT arrival. With bed mobility pt becomes resistant, moaning, and with tachycardia, resulting in cessation of mobility for patient safety. Significant change in participation and status from previous PT session based on documented notes. PT provides assistance with positioning in bed, placing pillow between knees to avoid pressure injury. PT also places an order for prevalon boots to reduce risk of heel ulcers. Acute PT will continue to follow, however if the pt remains unable to follow commands PT may sign off.  Recommendations for follow up therapy are one component of a multi-disciplinary discharge planning process, led by the attending physician.  Recommendations may be updated based on patient status, additional functional criteria and insurance authorization.  Follow Up Recommendations  Skilled nursing-short term rehab (<3 hours/day) Can patient physically be transported by private vehicle: No   Assistance Recommended at Discharge Frequent or constant Supervision/Assistance  Patient can return home with the following Two people to help with walking and/or transfers;Two people to help with bathing/dressing/bathroom;Assistance with cooking/housework;Direct supervision/assist for medications management;Direct supervision/assist for  financial management;Assist for transportation;Help with stairs or ramp for entrance   Equipment Recommendations  None recommended by PT    Recommendations for Other Services       Precautions / Restrictions Precautions Precautions: Fall Restrictions Weight Bearing Restrictions: No     Mobility  Bed Mobility Overal bed mobility: Needs Assistance Bed Mobility: Supine to Sit, Sit to Supine     Supine to sit: Total assist Sit to supine: Total assist   General bed mobility comments: pt resistant with posterior lean throughout. Pt becomes more alert once sitting but continues to not follow commands    Transfers                        Ambulation/Gait                   Stairs             Wheelchair Mobility    Modified Rankin (Stroke Patients Only)       Balance Overall balance assessment: Needs assistance Sitting-balance support: Feet unsupported, Single extremity supported Sitting balance-Leahy Scale: Zero Sitting balance - Comments: totalA, posterior lean Postural control: Posterior lean                                  Cognition Arousal/Alertness: Lethargic (opens eyes but does not respond to PT questions/cues) Behavior During Therapy: Restless Overall Cognitive Status: History of cognitive impairments - at baseline                                 General Comments: does not follow commands or actively participate in conversation, resistant  to bed mobility and appears in pain        Exercises      General Comments General comments (skin integrity, edema, etc.): resting HR in 120s in Afib, HR jumps to 166 briefly when mobilizing, upon which PT assist pt into supine to rest. HR returns to 120s when resting.      Pertinent Vitals/Pain Pain Assessment Pain Assessment: PAINAD Breathing: occasional labored breathing, short period of hyperventilation Negative Vocalization: repeated troubled calling out,  loud moaning/groaning, crying Facial Expression: facial grimacing Body Language: rigid, fists clenched, knees up, pushing/pulling away, strikes out Consolability: unable to console, distract or reassure PAINAD Score: 9 Pain Location: generalized Pain Descriptors / Indicators: Moaning Pain Intervention(s): Monitored during session    Home Living                          Prior Function            PT Goals (current goals can now be found in the care plan section) Acute Rehab PT Goals Patient Stated Goal: pt unable to state, last week had stated goal to walk again Progress towards PT goals: Not progressing toward goals - comment (limited by lethargy and impauired cognition)    Frequency    Min 2X/week      PT Plan Current plan remains appropriate    Co-evaluation              AM-PAC PT "6 Clicks" Mobility   Outcome Measure  Help needed turning from your back to your side while in a flat bed without using bedrails?: Total Help needed moving from lying on your back to sitting on the side of a flat bed without using bedrails?: Total Help needed moving to and from a bed to a chair (including a wheelchair)?: Total Help needed standing up from a chair using your arms (e.g., wheelchair or bedside chair)?: Total Help needed to walk in hospital room?: Total Help needed climbing 3-5 steps with a railing? : Total 6 Click Score: 6    End of Session   Activity Tolerance: Treatment limited secondary to medical complications (Comment);Patient limited by lethargy Patient left: in bed;with call bell/phone within reach;with bed alarm set Nurse Communication: Mobility status;Need for lift equipment PT Visit Diagnosis: Pain;Muscle weakness (generalized) (M62.81);Difficulty in walking, not elsewhere classified (R26.2) Pain - Right/Left: Right Pain - part of body: Hip     Time: 9169-4503 PT Time Calculation (min) (ACUTE ONLY): 10 min  Charges:  $Therapeutic Activity:  8-22 mins                     Zenaida Niece, PT, DPT Acute Rehabilitation Office [phone]    Zenaida Niece 10/15/2021, 1:30 PM

## 2021-10-15 NOTE — Consult Note (Signed)
[place] for Infectious Disease    Date of Admission:  10/09/2021     Reason for Consult:  Discitis/OM, psoas abscess     Referring Physician: Dr Alanda Amass  Current antibiotics: Vancomycin, cefepime, MTZ 7/7 -- present  ASSESSMENT:    76 y.o. female admitted with:  L4-5 discitis/osteomyelitis and right psoas abscess: Status post disc space aspiration on 7/12 with negative cultures thus far. History of multidrug-resistant organisms Atrial fibrillation with RVR Rheumatoid arthritis Diabetes Dementia Decubitus ulcer Sepsis  RECOMMENDATIONS:    Based on her prior cultures and history of MDRO will change antibiotics to daptomycin and meropenem Suspect likely urinary tract source leading to lumbar disease Follow cultures from IR disc space aspiration We will ask IR to evaluate for drainage of her psoas abscess Recommend palliative care involvement Wound care Glycemic control Lab monitoring Will follow.  Dr. Candiss Norse available as needed over the weekend.  New ID team will take over on Monday.   Principal Problem:   Atrial fibrillation with RVR (HCC) Active Problems:   Hypertension   Type 2 diabetes mellitus with hyperlipidemia (HCC)   Rheumatoid arthritis (HCC)   Gastroesophageal reflux disease   Chronic diastolic (congestive) heart failure (HCC)   A-fib (HCC)   Hypomagnesemia   Pressure injury of skin   Dementia with agitation (HCC)   Septic discitis of lumbar region   Psoas abscess, right (HCC)   MEDICATIONS:    Scheduled Meds:  (feeding supplement) PROSource Plus  30 mL Oral Daily   acetaminophen  975 mg Oral TID   buPROPion ER  100 mg Oral BID   cycloSPORINE  1 drop Both Eyes BID   diltiazem  360 mg Oral Daily   enoxaparin (LOVENOX) injection  40 mg Subcutaneous Q24H   ferrous sulfate  325 mg Oral BID WC   gabapentin  100 mg Oral BID   insulin aspart  0-20 Units Subcutaneous TID WC   ketotifen  1 drop Both Eyes BID   leflunomide  20 mg Oral Daily    magnesium oxide  400 mg Oral BID   metoprolol tartrate  10 mg Intravenous Q6H   mometasone-formoterol  2 puff Inhalation BID   oxyCODONE  7.5 mg Oral Q6H   pantoprazole  40 mg Oral BID   polyvinyl alcohol  2 drop Both Eyes Daily   predniSONE  5 mg Oral Daily   saccharomyces boulardii  250 mg Oral Daily   Continuous Infusions:  DAPTOmycin (CUBICIN) 450 mg in sodium chloride 0.9 % IVPB     diltiazem (CARDIZEM) infusion 15 mg/hr (10/15/21 0909)   meropenem (MERREM) IV     PRN Meds:.acetaminophen, albuterol, bupivacaine(PF), fentaNYL, Gerhardt's butt cream, haloperidol lactate, HYDROmorphone (DILAUDID) injection, midazolam, nitroGLYCERIN, ondansetron (ZOFRAN) IV, mouth rinse  HPI:    [name] is a 76 y.o. female with a complex past medical history as detailed below who was admitted on 10/21/2021.  She has been followed by neurosurgery as an outpatient and had an MRI on 7/5 which was significant for L4-5 discitis/osteomyelitis as well as a right psoas abscess.  She was in IR on the day of admission for aspiration of her disc space when she developed A-fib with rapid ventricular response.  She was thus taken to the emergency department for further evaluation.  At that time she was noted to be febrile with a temperature of 102.9 F.  EKG revealed A-fib with RVR.  She also had a leukocytosis.  Given her sepsis she was started  on broad-spectrum antibiotics with vancomycin, cefepime, metronidazole.  She has been hospitalized since that time.  She underwent IR disc space aspiration on 7/12 of the L4-5 disc space.  These cultures are no growth at this time.  She also has blood cultures from 7/7 which are also no growth finalized.  Of note, patient has multiple hospitalizations over the last several months with cultures growing different multidrug-resistant organisms.   Past Medical History:  Diagnosis Date   Abnormal LFTs 07/24/2017   Actinic keratosis    Acute CVA (cerebrovascular accident) ([place])     Acute deep vein thrombosis (DVT) of left lower extremity ([place]) 05/06/2014   Anemia    hx of   Anemia of chronic renal failure, stage 3 (moderate) ([place]) 07/17/2015   Anticoagulant causing adverse effect in therapeutic use 07/17/2015   Anxiety    Arthritis    rheumatoid   Atrial fibrillation ([place])    a. persistent, she remains on Xarelto   Carpal tunnel syndrome    Chronic diastolic (congestive) heart failure ([place])    a. 04/2016: echo showing a preserved EF of 55-60% with mild MR. LA and RA midly dilated.    Edema    left leg at ankle resolved now   Gastroesophageal reflux disease    Hepatitis 1970   not sure what kind   Hiatal hernia    Hyperlipidemia    Hypertension    Iron deficiency anemia due to chronic blood loss 07/17/2015   Jaundice    age 38   Peripheral vascular disease ([place]) yrs ago   DVT left lower leg questionale told by 2 drs i had no clot, 1 md said i did   Rheumatoid arthritis(714.0)    Right knee pain    Stroke ([place])    Type II diabetes mellitus (HCC)    type2    Social History   Tobacco Use   Smoking status: Never   Smokeless tobacco: Never  Vaping Use   Vaping Use: Never used  Substance Use Topics   Alcohol use: No    Alcohol/week: 0.0 standard drinks of alcohol   Drug use: No    Family History  Problem Relation Age of Onset   Pneumonia Mother 76   Stroke Father 7   Heart failure Sister    Hypertension Sister    Heart attack Neg Hx     Allergies  Allergen Reactions   Phenergan [Promethazine] Other (See Comments)    Listed on MAR Unknown reaction    Review of Systems  Unable to perform ROS: Dementia    OBJECTIVE:   Blood pressure (!) 118/96, pulse (!) 141, temperature 97.8 F (36.6 C), temperature source Axillary, resp. rate (!) 27, height '5\' 7"'$  (1.702 m), weight 57.8 kg, SpO2 99 %. Body mass index is 19.96 kg/m.  Physical Exam Constitutional:      Comments: Chronically ill-appearing woman, lying naked on her left side in the  bed, nonverbal this morning.  Her dinner tray is on the sink near the door of her room and has been untouched since yesterday afternoon.  HENT:     Head: Normocephalic and atraumatic.  Eyes:     Extraocular Movements: Extraocular movements intact.     Conjunctiva/sclera: Conjunctivae normal.  Cardiovascular:     Rate and Rhythm: Regular rhythm. Tachycardia present.  Pulmonary:     Effort: Pulmonary effort is normal. No respiratory distress.     Comments: Breath sounds are diminished at the bases Abdominal:  General: There is no distension.     Palpations: Abdomen is soft.  Musculoskeletal:        General: Normal range of motion.     Cervical back: Normal range of motion and neck supple.     Right lower leg: No edema.     Left lower leg: No edema.     Comments: She has sacral decubitus ulcer as documented by wound care  Skin:    General: Skin is warm and dry.     Findings: No rash.  Neurological:     Comments: There are no focal deficits appreciated however she is lethargic and nonverbal this morning      Lab Results: Lab Results  Component Value Date   WBC 10.6 (H) 10/14/2021   HGB 13.9 10/14/2021   HCT 45.0 10/14/2021   MCV 92.4 10/14/2021   PLT 376 10/14/2021    Lab Results  Component Value Date   NA 138 10/15/2021   K 3.5 10/15/2021   CO2 15 (L) 10/15/2021   GLUCOSE 100 (H) 10/15/2021   BUN 19 10/15/2021   CREATININE 0.74 10/15/2021   CALCIUM 10.0 10/15/2021   GFRNONAA >60 10/15/2021   GFRAA >60 03/06/2018    Lab Results  Component Value Date   ALT 21 10/28/2021   AST 19 10/25/2021   ALKPHOS 152 (H) 10/10/2021   BILITOT 0.7 10/15/2021    No results found for: "CRP"     Component Value Date/Time   ESRSEDRATE 38 (H) 10/08/2014 2335    I have reviewed the micro and lab results in Epic.  Imaging: No results found.   Imaging independently reviewed in Epic.  Raynelle High[facility] for Infectious Disease [place]  [facility] [phone] pager 10/15/2021, 9:18 AM

## 2021-10-15 NOTE — Progress Notes (Addendum)
PROGRESS NOTE    [name]  XMI:[id number]  DOB: February 16, 1946  DOA: 10/09/2021 PCP: Aletha Halim., PA-C Outpatient Specialists:   Hospital course: 76 y/o, with multiple co-morbidities including RA, DM, HFpEF, permanent a. Fib with episodes of RVR when sick, not a candidate for anticoagulation 2/2 h/o GI bleed from AVM, chronic blood loss anemia, CKD II, h/o CVA, Dementia, multiple pressure wounds and diabetic foot ulcers has had increasing back pain for several weeks. She is followed by Dr. Annette Stable with Neurosurgery. She had an MRI lumbar 10/06/21 revealing L4-5 discitis/osteomyelitis and a right psoas abscess. She was in IR dept for aspiration bx of lumbar lesion when she experienced atrial fibrillation with RVR.    Patient was admitted and started on vancomycin cefepime and metronidazole.  She is scheduled to have psoas abscess aspiration by IR today.  7/14- requested ID recommendations for current management. AF RVR persisting (likely secondary to infection) will request Cardiology evaluation  Subjective: Continues to remain encephalopathic. No focal neurological dysfunction, mental status changes thought to occur 3 days prior. ID following along.  Will consult cardiology given   Objective: Vitals:   10/15/21 1005 10/15/21 1100 10/15/21 1150 10/15/21 1200  BP: 139/75 (!) 133/92 (!) 139/94 (!) 152/109  Pulse: (!) 143 (!) 132 66   Resp: '16 12 17 17  '$ Temp:   (!) 97.4 F (36.3 C)   TempSrc:   Axillary   SpO2: 100% 99% 100%   Weight:      Height:        Intake/Output Summary (Last 24 hours) at 10/15/2021 1303 Last data filed at 10/15/2021 1100 Gross per 24 hour  Intake 1663.66 ml  Output 1 ml  Net 1662.66 ml   Filed Weights   10/12/21 2041 10/14/21 0440 10/15/21 0500  Weight: 66 kg 64.9 kg 57.8 kg     Exam:  General: Patient appears comfortable lying in bed, she is conversant although clearly has memory problems. Eyes: sclera anicteric, conjuctiva mild injection  bilaterally CVS: S1-S2, regular  Respiratory:  decreased air entry bilaterally secondary to decreased inspiratory effort, rales at bases  GI: NABS, soft, NT  LE: No edema.    Assessment & Plan:   Encephalopathy complicating baseline dementia Patient is encephalopathic today possibly secondary to Dilaudid given overnight for pain after biopsy, although patient has a history of agitation and delirium from previous hospitalization. Continue Haldol for management of agitation Avoid benzodiazepines, delirium precautions to avoid falls and aspiration. Keep normotensive, normoglycemic, normothermic.    Worsened RVR  Heart rate is ranging from 100-130 despite being maxed out on diltiazem drip Of note patient is not been able to take her oral metoprolol due to altered mental status Per RN, patient's heart rate decreases with Dilaudid so some of this appears to be pain mediated. Patient unable to take oral metoprolol, will change to IV metoprolol 5 every 6 No anticoagulation due to history of significant GI bleed Request assistance from cardiology for further management of AFRVR  Lumbar discitis/right psoas abscess S/p psoas abscess aspiration--cultures pending Continue vancomycin cefepime and metronidazole Being followed by neurosurgery This was supposed be done as an outpatient, potentially patient can be discharged once mental status clears for outpatient therapy. Request ID assistance for antibiotics  HFpEF Appears euvolemic  RA Continue prednisone Arava being held  DM 2 Continue present management    Copied from previous note:  Right buttock decubitus stage III Pressure Injury 10/13/2021 Buttocks Right;Upper Stage 3 -  Full thickness tissue  loss. Subcutaneous fat may be visible but bone, tendon or muscle are NOT exposed. (Active)  10/06/2021   Location: Buttocks  Location Orientation: Right;Upper  Staging: Stage 3 -  Full thickness tissue loss. Subcutaneous fat may be visible  but bone, tendon or muscle are NOT exposed.  Wound Description (Comments):   Present on Admission: Yes    DVT prophylaxis: Lovenox Code Status: DNR Family Communication: None today Disposition Plan:   Patient is from: [facility]  Anticipated Discharge Location: [facility]  Barriers to Discharge: Undergoing work-up for discitis  Is patient medically stable for Discharge: No   Scheduled Meds:  (feeding supplement) PROSource Plus  30 mL Oral Daily   acetaminophen  975 mg Oral TID   cycloSPORINE  1 drop Both Eyes BID   enoxaparin (LOVENOX) injection  40 mg Subcutaneous Q24H   gabapentin  100 mg Oral BID   insulin aspart  0-20 Units Subcutaneous TID WC   ketotifen  1 drop Both Eyes BID   leflunomide  20 mg Oral Daily   metoprolol tartrate  10 mg Intravenous Q6H   mometasone-formoterol  2 puff Inhalation BID   oxyCODONE  7.5 mg Oral Q6H   pantoprazole  40 mg Oral BID   polyvinyl alcohol  2 drop Both Eyes Daily   predniSONE  5 mg Oral Daily   saccharomyces boulardii  250 mg Oral Daily   Continuous Infusions:  DAPTOmycin (CUBICIN) 450 mg in sodium chloride 0.9 % IVPB 450 mg (10/15/21 1242)   diltiazem (CARDIZEM) infusion 15 mg/hr (10/15/21 1100)   meropenem (MERREM) IV 1 g (10/15/21 1133)    Data Reviewed:  Basic Metabolic Panel: Recent Labs  Lab 10/09/21 0142 10/10/21 0910 10/11/21 0546 10/12/21 0502 10/13/21 0135 10/14/21 0255 10/15/21 0449  NA 140   < > 139 140 139 137 138  K 3.9   < > 4.4 4.0 4.2 3.9 3.5  CL 104   < > 105 107 106 108 105  CO2 23   < > 19* 19* 18* 15* 15*  GLUCOSE 127*   < > 97 98 121* 85 100*  BUN 25*   < > 25* 21 27* 22 19  CREATININE 1.01*   < > 0.85 0.78 0.97 0.88 0.74  CALCIUM 9.0   < > 9.7 10.0 9.9 10.0 10.0  MG 1.9  --   --   --   --   --   --    < > = values in this interval not displayed.    CBC: Recent Labs  Lab 10/09/21 0142 10/10/21 0910 10/11/21 0546 10/13/21 0135 10/14/21 0255  WBC 10.7* 10.2 9.8 9.6 10.6*  NEUTROABS   --   --  7.3  --   --   HGB 10.2* 11.4* 11.1* 12.0 13.9  HCT 32.5* 35.9* 35.9* 37.8 45.0  MCV 93.4 91.8 93.2 91.1 92.4  PLT 331 376 366 368 376    Studies: No results found.  Principal Problem:   Septic discitis of lumbar region Active Problems:   A-fib ([place])   Psoas abscess, right (HCC)   Type 2 diabetes mellitus with hyperlipidemia (HCC)   Rheumatoid arthritis (HCC)   Chronic diastolic (congestive) heart failure (HCC)   Pressure injury of skin   Dementia with agitation (HCC)   Hypertension   Gastroesophageal reflux disease   Hypomagnesemia   Atrial fibrillation with RVR ([place])     Ensley Blas, [facility]  If 7PM-7AM, please contact night-coverage [email or web address]   LOS: 7  days

## 2021-10-15 NOTE — Discharge Instructions (Signed)
rx

## 2021-10-15 NOTE — Consult Note (Addendum)
Cardiology Consultation:   Patient ID: [name] MRN: [id number]; DOB: 11-08-1945  Admit date: 10/11/2021 Date of Consult: 10/15/2021  PCP:  Aletha Halim., PA-C   [facility] Providers Cardiologist:  Kirk Ruths, MD   {  Patient Profile:   [name] is a 76 y.o. female with a hx of atrial fibrillation, not on anticoagulation secondary to GI bleed from AVMs, chronic diastolic heart failure, chronic kidney disease stage III, hypertension, hyperlipidemia, DM, history of CVA, dementia and rheumatoid arthritis on steroid who is being seen 10/15/2021 for the evaluation of atrial fibrillation with rapid ventricular at the request of Dr. Alanda Amass.  History of elevated heart rate secondary to sepsis or infection requiring cardiology evaluation during multiple admission.  Most recently admitted 4/23.  He was seen by Dr. Stanford Breed on follow-up 08/09/2021.  Plan was to get monitor to determine baseline heart rate and titrate medication.  However staff was unable to reach patient to arrange monitor.  Last echocardiogram February 2023 with LV function of 60 to 70%, no regional wall motion abnormality and mild LVH.  History of Present Illness:   [name] was found to have L4-5 discitis/osteomyelitis and right psoas abscess on lumbar MRI 7/5.  He was scheduled to have IR aspiration 7/7 but noted in A-fib RVR leading to ER evaluation and admission.  The procedure was canceled.  Treated with vancomycin, cefepime and metronidazole.  For elevated heart rate she was placed on IV Cardizem, p.o. metoprolol with titration.  Patient underwent IR disc aspiration on 7/12.  Pending culture to date.  Seen by ID today and change in antibiotic to daptomycin and meropenem.  Patient remained encephalopathic in setting of baseline dementia.  She was unable to take p.o.  Heart rate sustaining in 130-50s on IV Cardizem 15 mg/h.  She was also on p.o. Cardizem 360 mg and metoprolol titrate to 100 mg twice daily.  Did  not given since last night due to unable to take p.o. due to encephalopathy.  Started on IV metoprolol 10 mg every 6 hours.  Cardiology is asked for further rate management.  K 3.5 BNP 217 Scr stable  Past Medical History:  Diagnosis Date   Abnormal LFTs 07/24/2017   Actinic keratosis    Acute CVA (cerebrovascular accident) ([place])    Acute deep vein thrombosis (DVT) of left lower extremity ([place]) 05/06/2014   Anemia    hx of   Anemia of chronic renal failure, stage 3 (moderate) ([place]) 07/17/2015   Anticoagulant causing adverse effect in therapeutic use 07/17/2015   Anxiety    Arthritis    rheumatoid   Atrial fibrillation ([place])    a. persistent, she remains on Xarelto   Carpal tunnel syndrome    Chronic diastolic (congestive) heart failure ([place])    a. 04/2016: echo showing a preserved EF of 55-60% with mild MR. LA and RA midly dilated.    Edema    left leg at ankle resolved now   Gastroesophageal reflux disease    Hepatitis 1970   not sure what kind   Hiatal hernia    Hyperlipidemia    Hypertension    Iron deficiency anemia due to chronic blood loss 07/17/2015   Jaundice    age 71   Peripheral vascular disease ([place]) yrs ago   DVT left lower leg questionale told by 2 drs i had no clot, 1 md said i did   Rheumatoid arthritis(714.0)    Right knee pain    Stroke ([place])  Type II diabetes mellitus ([place])    type2    Past Surgical History:  Procedure Laterality Date   CATARACT EXTRACTION Bilateral    COLONOSCOPY WITH PROPOFOL N/A 02/20/2015   Surgeon: Carol Ada, MD; nonbleeding cecal AVM s/p APC, 2 mm cecal polyp, transverse colon lipoma, diverticula.  No recommendations to repeat due to age and comorbidities. Pathology benign.   ENTEROSCOPY N/A 03/23/2016   Surgeon: Carol Ada, MD;  1 nonbleeding superficial gastric ulcer, normal duodenum, normal examined jejunum.   ENTEROSCOPY N/A 06/17/2016   Surgeon: Carol Ada, MD;  normal exam.   ESOPHAGOGASTRODUODENOSCOPY (EGD) WITH  PROPOFOL N/A 02/20/2015   Surgeon: Carol Ada, MD; normal exam.  Biopsies taken from the duodenum. Pathology benign.   GIVENS CAPSULE STUDY N/A 03/21/2016   Procedure: GIVENS CAPSULE STUDY;  Surgeon: Carol Ada, MD;  active bleeding in the proximal small bowel   HAND SURGERY  1995 and 1996   artificial joints both hands   JOINT REPLACEMENT     LUMBAR LAMINECTOMY/DECOMPRESSION MICRODISCECTOMY N/A 02/10/2016   Procedure: MICROLUMBAR DECOMPRESSION L4-L5 AND L3- L4, AND EXCISION OF SYNOVIAL CYST L4-L5;  Surgeon: Susa Day, MD;  Location: [facility];  Service: Orthopedics;  Laterality: N/A;   TOTAL HIP ARTHROPLASTY Left 2009   TOTAL HIP ARTHROPLASTY Right 04/03/2013   Procedure: RIGHT TOTAL HIP ARTHROPLASTY ANTERIOR APPROACH;  Surgeon: Gearlean Alf, MD;  Location: [facility];  Service: Orthopedics;  Laterality: Right;    Inpatient Medications: Scheduled Meds:  (feeding supplement) PROSource Plus  30 mL Oral Daily   acetaminophen  975 mg Oral TID   cycloSPORINE  1 drop Both Eyes BID   enoxaparin (LOVENOX) injection  40 mg Subcutaneous Q24H   gabapentin  100 mg Oral BID   insulin aspart  0-20 Units Subcutaneous TID WC   ketotifen  1 drop Both Eyes BID   leflunomide  20 mg Oral Daily   metoprolol tartrate  10 mg Intravenous Q6H   mometasone-formoterol  2 puff Inhalation BID   oxyCODONE  7.5 mg Oral Q6H   pantoprazole  40 mg Oral BID   polyvinyl alcohol  2 drop Both Eyes Daily   predniSONE  5 mg Oral Daily   saccharomyces boulardii  250 mg Oral Daily   Continuous Infusions:  DAPTOmycin (CUBICIN) 450 mg in sodium chloride 0.9 % IVPB 450 mg (10/15/21 1242)   diltiazem (CARDIZEM) infusion 15 mg/hr (10/15/21 1100)   meropenem (MERREM) IV 1 g (10/15/21 1133)   PRN Meds: acetaminophen, albuterol, Gerhardt's butt cream, haloperidol lactate, midazolam, nitroGLYCERIN, ondansetron (ZOFRAN) IV, mouth rinse  Allergies:    Allergies  Allergen Reactions   Phenergan [Promethazine] Other (See  Comments)    Listed on MAR Unknown reaction    Social History:   Social History   Socioeconomic History   Marital status: Divorced    Spouse name: Not on file   Number of children: Not on file   Years of education: Not on file   Highest education level: Not on file  Occupational History   Occupation: Retired  Tobacco Use   Smoking status: Never   Smokeless tobacco: Never  Vaping Use   Vaping Use: Never used  Substance and Sexual Activity   Alcohol use: No    Alcohol/week: 0.0 standard drinks of alcohol   Drug use: No   Sexual activity: Not Currently  Other Topics Concern   Not on file  Social History Narrative   Not on file   Social Determinants of Health  Financial Resource Strain: Not on file  Food Insecurity: Not on file  Transportation Needs: Not on file  Physical Activity: Not on file  Stress: Not on file  Social Connections: Not on file  Intimate Partner Violence: Not on file    Family History:    Family History  Problem Relation Age of Onset   Pneumonia Mother 68   Stroke Father 109   Heart failure Sister    Hypertension Sister    Heart attack Neg Hx      ROS:  Please see the history of present illness.  All other ROS reviewed and negative.     Physical Exam/Data:   Vitals:   10/15/21 1005 10/15/21 1100 10/15/21 1150 10/15/21 1200  BP: 139/75 (!) 133/92 (!) 139/94 (!) 152/109  Pulse: (!) 143 (!) 132 66   Resp: '16 12 17 17  '$ Temp:   (!) 97.4 F (36.3 C)   TempSrc:   Axillary   SpO2: 100% 99% 100%   Weight:      Height:        Intake/Output Summary (Last 24 hours) at 10/15/2021 1325 Last data filed at 10/15/2021 1100 Gross per 24 hour  Intake 1663.66 ml  Output 1 ml  Net 1662.66 ml      10/15/2021    5:00 AM 10/14/2021    4:40 AM 10/12/2021    8:41 PM  Last 3 Weights  Weight (lbs) 127 lb 6.8 oz 143 lb 1.3 oz 145 lb 8.1 oz  Weight (kg) 57.8 kg 64.9 kg 66 kg     Body mass index is 19.96 kg/m.  General:  Elderly demented ill  appearing female in no acute distress HEENT: normal Neck: no JVD Vascular: No carotid bruits; Distal pulses 2+ bilaterally Cardiac:  normal S1, S2; irregularly irregular tachycardic ; no murmur  Lungs:  clear to auscultation bilaterally, no wheezing, rhonchi or rales  Abd: soft, nontender, no hepatomegaly  Ext: no edema Musculoskeletal:  No deformities Skin: warm and dry  Neuro:  no focal abnormalities noted Psych:  demented, AMS  EKG:  The EKG was personally reviewed and demonstrates:  Atrial fibrillation HR 182 Telemetry:  Telemetry was personally reviewed and demonstrates:  Atrial fibrillation HR 130-150s  Relevant CV Studies:  Echo 05/2011 1. Left ventricular ejection fraction, by estimation, is 65 to 70%. The  left ventricle has normal function. The left ventricle has no regional  wall motion abnormalities. There is mild left ventricular hypertrophy.  Left ventricular diastolic parameters  are indeterminate.   2. Right ventricular systolic function is normal. The right ventricular  size is normal. There is moderately elevated pulmonary artery systolic  pressure.   3. The mitral valve is normal in structure. Trivial mitral valve  regurgitation.   4. The aortic valve is tricuspid. Aortic valve regurgitation is not  visualized.   Comparison(s): The left ventricular function is unchanged.   Laboratory Data:  High Sensitivity Troponin:   Recent Labs  Lab 10/24/2021 1054 10/06/2021 1240  TROPONINIHS 24* 20*     Chemistry Recent Labs  Lab 10/09/21 0142 10/10/21 0910 10/13/21 0135 10/14/21 0255 10/15/21 0449  NA 140   < > 139 137 138  K 3.9   < > 4.2 3.9 3.5  CL 104   < > 106 108 105  CO2 23   < > 18* 15* 15*  GLUCOSE 127*   < > 121* 85 100*  BUN 25*   < > 27* 22 19  CREATININE 1.01*   < >  0.97 0.88 0.74  CALCIUM 9.0   < > 9.9 10.0 10.0  MG 1.9  --   --   --   --   GFRNONAA 58*   < > >60 >60 >60  ANIONGAP 13   < > 15 14 18*   < > = values in this interval not  displayed.    Hematology Recent Labs  Lab 10/11/21 0546 10/13/21 0135 10/14/21 0255  WBC 9.8 9.6 10.6*  RBC 3.85* 4.15 4.87  HGB 11.1* 12.0 13.9  HCT 35.9* 37.8 45.0  MCV 93.2 91.1 92.4  MCH 28.8 28.9 28.5  MCHC 30.9 31.7 30.9  RDW 15.9* 15.7* 15.6*  PLT 366 368 376    BNP Recent Labs  Lab 10/15/21 0449  BNP 217.2*    Radiology/Studies:  No results found.   Assessment and Plan:   Atrial fibrillation with rapid ventricular rate -Long standing history of intermittent elevated heart rate in setting of sepsis or infection. -Her heart rate was stable in 120s for past few days however worsens gradually since yesterday afternoon.  Unable to take p.o. medication due to encephalopathy. -Continue IV diltiazem 15 mg/h (max dose) -Agree with IV metoprolol 10 mg every 6 hours while unable to take po -Will likely avoid IV amiodarone to prevent chemical cardioversion as she cannot be on anticoagulation due to history of GI bleed secondary to AVM -will given IV digoxin 0.'25mg'$  x 1. Renal function is normal.  -Heart rate will remain elevated until underlying infection/spesis resolves -Echo 05/2021 with normal LVEF  Otherwise per primary team   Risk Assessment/Risk Scores:   CHA2DS2-VASc Score = 8   This indicates a 10.8% annual risk of stroke. The patient's score is based upon: CHF History: 0 HTN History: 1 Diabetes History: 1 Stroke History: 2 Vascular Disease History: 1 Age Score: 2 Gender Score: 1   For questions or updates, please contact [place] Please consult [email or web address] for contact info under  Jarrett Soho, PA  10/15/2021 1:25 PM   Patient seen and examined, note reviewed with the signed Advanced Practice Provider. I personally reviewed laboratory data, imaging studies and relevant notes. I independently examined the patient and formulated the important aspects of the plan. I have personally discussed the plan with the patient and/or family.  Comments or changes to the note/plan are indicated below.  Patient was seen and examined at her bedside.Unable to obtain history from patient. Chart review for medical history.  Tele revealed Afib with rvr.   On Cardizem gtt, lopressor IV, will give Digoxin 250 mcg x1 for now.   Berniece Salines DO, MS [facility] Attending Cardiologist [place]  [address] [place], [place] [address] [phone] Website: [email or web address]

## 2021-10-15 NOTE — TOC Progression Note (Signed)
Transition of Care [facility]) - Progression Note    Patient Details  Name: [name] MRN: [id number] Date of Birth: [birth year]  Transition of Care [facility]) CM/SW Contact  Zenon Mayo, RN Phone Number: 10/15/2021, 3:43 PM  Clinical Narrative:    conts on cardizem drip, iv abx, plan for [facility] at [employer].  TOC following.   Expected Discharge Plan: [place] Barriers to Discharge: Continued Medical Work up, SNF Pending bed offer  Expected Discharge Plan and Services Expected Discharge Plan: [place] In-house Referral: Clinical Social Work   Post Acute Care Choice: [place] Living arrangements for the past 2 months: [place]                                       Social Determinants of Health (SDOH) Interventions    Readmission Risk Interventions    08/03/2021   11:07 AM 07/26/2021    2:12 PM 06/29/2021   11:03 AM  Readmission Risk Prevention Plan  Transportation Screening Complete Complete Complete  Medication Review [employer]) Complete  Complete  HRI or Home Care Consult Complete Complete Complete  SW Recovery Care/Counseling Consult Complete Complete Complete  Palliative Care Screening Not Applicable  Complete  Skilled Nursing Facility Complete Complete Complete

## 2021-10-16 DIAGNOSIS — M4646 Discitis, unspecified, lumbar region: Secondary | ICD-10-CM | POA: Diagnosis not present

## 2021-10-16 DIAGNOSIS — I4891 Unspecified atrial fibrillation: Secondary | ICD-10-CM | POA: Diagnosis not present

## 2021-10-16 DIAGNOSIS — Z7189 Other specified counseling: Secondary | ICD-10-CM | POA: Diagnosis not present

## 2021-10-16 DIAGNOSIS — Z515 Encounter for palliative care: Secondary | ICD-10-CM

## 2021-10-16 LAB — CK: Total CK: 89 U/L (ref 38–234)

## 2021-10-16 MED ORDER — LORAZEPAM 2 MG/ML IJ SOLN
1.0000 mg | INTRAMUSCULAR | Status: DC
  Administered 2021-10-16 – 2021-10-19 (×16): 1 mg via INTRAVENOUS
  Filled 2021-10-16 (×16): qty 1

## 2021-10-16 MED ORDER — POLYVINYL ALCOHOL 1.4 % OP SOLN
1.0000 [drp] | Freq: Four times a day (QID) | OPHTHALMIC | Status: DC | PRN
  Administered 2021-10-17 – 2021-10-19 (×2): 1 [drp] via OPHTHALMIC

## 2021-10-16 MED ORDER — LORAZEPAM 2 MG/ML IJ SOLN: 0.5000 mg | INTRAMUSCULAR | Status: DC | PRN

## 2021-10-16 MED ORDER — POTASSIUM CHLORIDE 10 MEQ/100ML IV SOLN
10.0000 meq | INTRAVENOUS | Status: DC
  Administered 2021-10-16 (×2): 10 meq via INTRAVENOUS
  Filled 2021-10-16 (×4): qty 100

## 2021-10-16 MED ORDER — DIGOXIN 0.25 MG/ML IJ SOLN
0.2500 mg | Freq: Four times a day (QID) | INTRAMUSCULAR | Status: DC
  Administered 2021-10-16: 0.25 mg via INTRAVENOUS
  Filled 2021-10-16: qty 2

## 2021-10-16 MED ORDER — GLYCOPYRROLATE 0.2 MG/ML IJ SOLN: 0.2000 mg | INTRAMUSCULAR | Status: DC | PRN

## 2021-10-16 MED ORDER — HYDROMORPHONE HCL 1 MG/ML IJ SOLN
0.5000 mg | INTRAMUSCULAR | Status: DC | PRN
  Administered 2021-10-18 – 2021-10-19 (×6): 1 mg via INTRAVENOUS
  Filled 2021-10-16 (×6): qty 1

## 2021-10-16 MED ORDER — GLYCOPYRROLATE 1 MG PO TABS: 1.0000 mg | ORAL_TABLET | ORAL | Status: DC | PRN

## 2021-10-16 MED ORDER — HYDROMORPHONE HCL 1 MG/ML IJ SOLN
1.0000 mg | INTRAMUSCULAR | Status: DC
  Administered 2021-10-16 – 2021-10-19 (×16): 1 mg via INTRAVENOUS
  Filled 2021-10-16 (×16): qty 1

## 2021-10-16 NOTE — Progress Notes (Signed)
Progress Note  Patient Name: [name] Date of Encounter: 10/16/2021  [facility] Cardiologist: Kirk Ruths, MD   Subjective   No events overnight  Inpatient Medications    Scheduled Meds:  (feeding supplement) PROSource Plus  30 mL Oral Daily   acetaminophen  975 mg Oral TID   cycloSPORINE  1 drop Both Eyes BID   enoxaparin (LOVENOX) injection  40 mg Subcutaneous Q24H   gabapentin  100 mg Oral BID   insulin aspart  0-20 Units Subcutaneous TID WC   ketotifen  1 drop Both Eyes BID   leflunomide  20 mg Oral Daily   metoprolol tartrate  10 mg Intravenous Q6H   mometasone-formoterol  2 puff Inhalation BID   oxyCODONE  7.5 mg Oral Q6H   pantoprazole  40 mg Oral BID   polyvinyl alcohol  2 drop Both Eyes Daily   predniSONE  5 mg Oral Daily   saccharomyces boulardii  250 mg Oral Daily   Continuous Infusions:  DAPTOmycin (CUBICIN) 450 mg in sodium chloride 0.9 % IVPB 118 mL/hr at 10/15/21 1300   diltiazem (CARDIZEM) infusion 15 mg/hr (10/16/21 0328)   meropenem (MERREM) IV 1 g (10/15/21 2142)   PRN Meds: acetaminophen, albuterol, Gerhardt's butt cream, haloperidol lactate, midazolam, nitroGLYCERIN, ondansetron (ZOFRAN) IV, mouth rinse   Vital Signs    Vitals:   10/15/21 2018 10/15/21 2045 10/16/21 0000 10/16/21 0533  BP: (!) 152/84  (!) 161/94 (!) 135/112  Pulse: (!) 136 (!) 137 (!) 39 78  Resp: (!) 22 (!) 29 (!) 24 (!) 24  Temp: (!) 96.3 F (35.7 C)   97.9 F (36.6 C)  TempSrc: Axillary   Oral  SpO2: 100%  100% 100%  Weight:    57.1 kg  Height:        Intake/Output Summary (Last 24 hours) at 10/16/2021 0714 Last data filed at 10/16/2021 0600 Gross per 24 hour  Intake 577.93 ml  Output 0 ml  Net 577.93 ml      10/16/2021    5:33 AM 10/15/2021    5:00 AM 10/14/2021    4:40 AM  Last 3 Weights  Weight (lbs) 125 lb 14.1 oz 127 lb 6.8 oz 143 lb 1.3 oz  Weight (kg) 57.1 kg 57.8 kg 64.9 kg      Telemetry    Afib RVR - Personally Reviewed  ECG     N/a - Personally Reviewed  Physical Exam   GEN: No acute distress.   Neck: No JVD Cardiac: irreg, tachy Respiratory: Clear to auscultation bilaterally. GI: Soft, nontender, non-distended  MS: No edema; No deformity. Neuro:  Nonfocal  Psych: Normal affect   Labs    High Sensitivity Troponin:   Recent Labs  Lab 10/18/2021 1054 10/28/2021 1240  TROPONINIHS 24* 20*     Chemistry Recent Labs  Lab 10/13/21 0135 10/14/21 0255 10/15/21 0449  NA 139 137 138  K 4.2 3.9 3.5  CL 106 108 105  CO2 18* 15* 15*  GLUCOSE 121* 85 100*  BUN 27* 22 19  CREATININE 0.97 0.88 0.74  CALCIUM 9.9 10.0 10.0  GFRNONAA >60 >60 >60  ANIONGAP 15 14 18*    Lipids No results for input(s): "CHOL", "TRIG", "HDL", "LABVLDL", "LDLCALC", "CHOLHDL" in the last 168 hours.  Hematology Recent Labs  Lab 10/11/21 0546 10/13/21 0135 10/14/21 0255  WBC 9.8 9.6 10.6*  RBC 3.85* 4.15 4.87  HGB 11.1* 12.0 13.9  HCT 35.9* 37.8 45.0  MCV 93.2 91.1 92.4  MCH 28.8 28.9 28.5  MCHC 30.9 31.7 30.9  RDW 15.9* 15.7* 15.6*  PLT 366 368 376   Thyroid No results for input(s): "TSH", "FREET4" in the last 168 hours.  BNP Recent Labs  Lab 10/15/21 0449  BNP 217.2*    DDimer No results for input(s): "DDIMER" in the last 168 hours.   Radiology    No results found.  Cardiac Studies    Patient Profile     CHELSEE HOSIE is a 76 y.o. female with a hx of atrial fibrillation, not on anticoagulation secondary to GI bleed from AVMs, chronic diastolic heart failure, chronic kidney disease stage III, hypertension, hyperlipidemia, DM, history of CVA, dementia and rheumatoid arthritis on steroid who is being seen 10/15/2021 for the evaluation of atrial fibrillation with rapid ventricular at the request of Dr. Alanda Amass.  Assessment & Plan    Afib with RVR in setting of sepsis -long history of afib, typically RVR during prior admissions in setting of systemic illness - has not been on PO meds due to AMS - on dilt gtt, IV  Lopressor - not on anticoag due to prior history of GI bleed - dilt gtt at 15, IV lopressor '10mg'$  every 6 hrs, digoxin 0.'25mg'$  one time dose yesterday. [place] IV digoxin 0.'25mg'$  IV every 6 hrs x 2 additional doses today. Rates should improve as systemic illness improves - avoiding amio since cannot be on anticoag, risk of chemical conversion  For questions or updates, please contact [place] Please consult [email or web address] for contact info under        Signed, Carlyle Dolly, MD  10/16/2021, 7:14 AM

## 2021-10-16 NOTE — Progress Notes (Signed)
PROGRESS NOTE    [name]  ZOX:[id number]  DOB: September 26, 1945  DOA: 10/12/2021 PCP: Aletha Halim., PA-C Outpatient Specialists:   Hospital course: 76 y/o, with multiple co-morbidities including RA, DM, HFpEF, permanent a. Fib with episodes of RVR when sick, not a candidate for anticoagulation 2/2 h/o GI bleed from AVM, chronic blood loss anemia, CKD II, h/o CVA, Dementia, multiple pressure wounds and diabetic foot ulcers has had increasing back pain for several weeks. She is followed by Dr. Annette Stable with Neurosurgery. She had an MRI lumbar 10/06/21 revealing L4-5 discitis/osteomyelitis and a right psoas abscess. She was in IR dept for aspiration bx of lumbar lesion when she experienced atrial fibrillation with RVR.   Patient was admitted and started on vancomycin cefepime and metronidazole.  Needs psoas muscle biopsy.  Poor QOL and prognosis-- in and out of the hospital with worsening dementia.  Will ask palliative care to consult    Subjective: Will track me with her eyes but will not speak.   (May be behavioral as she spoke with other MDs)  Objective: Vitals:   10/16/21 0000 10/16/21 0533 10/16/21 0755 10/16/21 0800  BP: (!) 161/94 (!) 135/112  (!) 148/81  Pulse: (!) 39 78  72  Resp: (!) 24 (!) 24  (!) 22  Temp:  97.9 F (36.6 C)    TempSrc:  Oral    SpO2: 100% 100% 99% 99%  Weight:  57.1 kg    Height:        Intake/Output Summary (Last 24 hours) at 10/16/2021 1111 Last data filed at 10/16/2021 0800 Gross per 24 hour  Intake 518.69 ml  Output --  Net 518.69 ml   Filed Weights   10/14/21 0440 10/15/21 0500 10/16/21 0533  Weight: 64.9 kg 57.8 kg 57.1 kg     Exam:   General: Appearance:    Thin female in no acute distress     Lungs:     respirations unlabored  Heart:    Normal heart rate.   MS:   All extremities are intact.   Neurologic:   Awake, alert      Assessment & Plan:   Encephalopathy complicating baseline dementia -history of agitation and  delirium from previous hospitalization. Continue Haldol for management of agitation Avoid benzodiazepines, delirium precautions to avoid falls and aspiration. Poor prognosis  A fib with RVR  -long history of afib, typically RVR during prior admissions in setting of systemic illness - has not been on PO meds due to AMS - on dilt gtt, IV Lopressor - not on anticoag due to prior history of GI bleed-- high risk for CVA - cards consult appreciated: avoiding amio since cannot be on anticoag, risk of chemical conversion  Lumbar discitis/right psoas abscess S/p disc aspiration--cultures NGTD Continue vancomycin cefepime and metronidazole Being followed by neurosurgery -ID consult -IR consult-- will need repeat CT Scan prior to aspiration  HFpEF Appears euvolemic  RA Continue low dose prednisone Arava being held  DM 2 SSI  Palliative care consult for GOC  Right buttock decubitus stage III Pressure Injury 10/11/2021 Buttocks Right;Upper Stage 3 -  Full thickness tissue loss. Subcutaneous fat may be visible but bone, tendon or muscle are NOT exposed. (Active)  10/15/2021   Location: Buttocks  Location Orientation: Right;Upper  Staging: Stage 3 -  Full thickness tissue loss. Subcutaneous fat may be visible but bone, tendon or muscle are NOT exposed.  Wound Description (Comments):   Present on Admission: Yes    DVT  prophylaxis: Lovenox Code Status: DNR Family Communication: called son Disposition Plan:   Patient is from: [facility]  Anticipated Discharge Location: [facility]    Scheduled Meds:  (feeding supplement) PROSource Plus  30 mL Oral Daily   acetaminophen  975 mg Oral TID   cycloSPORINE  1 drop Both Eyes BID   digoxin  0.25 mg Intravenous Q6H   enoxaparin (LOVENOX) injection  40 mg Subcutaneous Q24H   gabapentin  100 mg Oral BID   insulin aspart  0-20 Units Subcutaneous TID WC   ketotifen  1 drop Both Eyes BID   leflunomide  20 mg Oral Daily   metoprolol  tartrate  10 mg Intravenous Q6H   mometasone-formoterol  2 puff Inhalation BID   oxyCODONE  7.5 mg Oral Q6H   pantoprazole  40 mg Oral BID   polyvinyl alcohol  2 drop Both Eyes Daily   predniSONE  5 mg Oral Daily   saccharomyces boulardii  250 mg Oral Daily   Continuous Infusions:  DAPTOmycin (CUBICIN) 450 mg in sodium chloride 0.9 % IVPB 118 mL/hr at 10/15/21 1300   diltiazem (CARDIZEM) infusion 15 mg/hr (10/16/21 0328)   meropenem (MERREM) IV 1 g (10/15/21 2142)   potassium chloride 10 mEq (10/16/21 1104)    Data Reviewed:  Basic Metabolic Panel: Recent Labs  Lab 10/11/21 0546 10/12/21 0502 10/13/21 0135 10/14/21 0255 10/15/21 0449  NA 139 140 139 137 138  K 4.4 4.0 4.2 3.9 3.5  CL 105 107 106 108 105  CO2 19* 19* 18* 15* 15*  GLUCOSE 97 98 121* 85 100*  BUN 25* 21 27* 22 19  CREATININE 0.85 0.78 0.97 0.88 0.74  CALCIUM 9.7 10.0 9.9 10.0 10.0    CBC: Recent Labs  Lab 10/10/21 0910 10/11/21 0546 10/13/21 0135 10/14/21 0255  WBC 10.2 9.8 9.6 10.6*  NEUTROABS  --  7.3  --   --   HGB 11.4* 11.1* 12.0 13.9  HCT 35.9* 35.9* 37.8 45.0  MCV 91.8 93.2 91.1 92.4  PLT 376 366 368 376    Studies: No results found.  Principal Problem:   Septic discitis of lumbar region Active Problems:   A-fib ([place])   Psoas abscess, right (HCC)   Type 2 diabetes mellitus with hyperlipidemia (HCC)   Rheumatoid arthritis (HCC)   Chronic diastolic (congestive) heart failure (HCC)   Pressure injury of skin   Dementia with agitation (HCC)   Hypertension   Gastroesophageal reflux disease   Hypomagnesemia   Atrial fibrillation with RVR ([place])     Jannis Atkins U Litha Lamartina,DO [facility]  If 7PM-7AM, please contact night-coverage [email or web address]   LOS: 8 days

## 2021-10-16 NOTE — Consult Note (Signed)
Palliative Medicine Inpatient Consult Note  Consulting Provider: Geradine Girt, DO  Reason for consult:   [place] Palliative Medicine Consult  Reason for Consult? goc   10/16/2021  HPI:  Per intake H&P --> 76 y/o, with multiple co-morbidities including RA, DM, HFpEF, permanent a. Fib with episodes of RVR when sick, not a candidate for anticoagulation 2/2 h/o GI bleed from AVM, chronic blood loss anemia, CKD II, h/o CVA, Dementia, multiple pressure wounds and diabetic foot ulcers has had increasing back pain for several weeks. She is followed by Dr. Annette Stable with Neurosurgery. She had an MRI lumbar 10/06/21 revealing L4-5 discitis/osteomyelitis and a right psoas abscess. She was in IR dept for aspiration bx of lumbar lesion when she experienced atrial fibrillation with RVR.  Palliative care asked to get involved to address goals of care.   Clinical Assessment/Goals of Care:  *Please note that this is a verbal dictation therefore any spelling or grammatical errors are due to the "[place] [employer]" system interpretation.  I have reviewed medical records including EPIC notes, labs and imaging, received report from bedside RN, assessed the patient.    I called patients son, [name] to further discuss diagnosis prognosis, [place], EOL wishes, disposition and options.   I introduced Palliative Medicine as specialized medical care for people living with serious illness. It focuses on providing relief from the symptoms and stress of a serious illness. The goal is to improve quality of life for both the patient and the family.  Medical History Review and Understanding:  Discussed in brief patients h/o RA, DM, HFpEF, permanent a. Fib, CKD III, dementia.  Social History, Functional, and Nutritional State:  Has been a resident of [facility] since 2019. Total care at this point.  Has had a decreasing appetite as of recently.  Palliative Symptoms:  Continuous pain from  RA.   Advance Directives:  A detailed discussion was had today regarding advanced directives.  Fara Olden shares that he is patients only living son.   Code Status:  [place] is an established DNAR/DNI code status.   Discussion:  We discussed Nyema's reason for hospitalization and the complex nature of her diease processes. Discussed her eight day hospitalization in the setting of a right psoas abscess and ongoing Afib w. RVR which has been difficult to control.   Reviewed that Aeron has been suffering since she was 76 years old with her RA. Fara Olden shares that he has seen her bounce back in the past from some very difficult things though he does not feel optimistic that she will bounce back from this. We reviewed either continuing present measures or shifting our comfort to making [place] comfortable. We talked about transition to comfort measures in house and what that would entail inclusive of medications to control pain, dyspnea, agitation, nausea, itching, and hiccups.  We discussed stopping all uneccessary measures such as cardiac monitoring, blood draws, needle sticks, and frequent vital signs.    Discussed the importance of continued conversation with family and their  medical providers regarding overall plan of care and treatment options, ensuring decisions are within the context of the patients values and GOCs.  Decision Maker: [name] (Son): [phone] ([place])  SUMMARY OF RECOMMENDATIONS   DNAR/DNI  Shift focus to comfort care  Dilaudid and Ativan Q4H ATC  Unrestricted visitation  Palliative care will continue to follow and offer symptom support  If remains stable will broach IP Hospice placement  Code Status/Advance Care Planning: DNAR/DNI  Palliative Prophylaxis:  Aspiration, Bowel Regimen, Delirium Protocol, Frequent Pain Assessment, Oral Care, Palliative Wound Care, and Turn Reposition  Additional Recommendations (Limitations, Scope, Preferences): Continue current  care  Psycho-social/Spiritual:  Desire for further Chaplaincy support: Continue current care Additional Recommendations: Education on chronic disease burden and end of life trajectory   Prognosis: Limited to days  Discharge Planning: Discharge plan uncertain will see how her symptoms are managed though may be an in hospital death  Vitals:   10/27/2021 0755 10/27/2021 0800  BP:  (!) 148/81  Pulse:  72  Resp:  (!) 22  Temp:    SpO2: 99% 99%    Intake/Output Summary (Last 24 hours) at 2021/10/27 1057 Last data filed at Oct 27, 2021 0600 Gross per 24 hour  Intake 532.85 ml  Output --  Net 532.85 ml   Last Weight  Most recent update: October 27, 2021  5:38 AM    Weight  57.1 kg (125 lb 14.1 oz)            Gen:  Very ill appearing Caucasian elderly F HEENT: moist mucous membranes CV: Irregular rate and rhythm  PULM: 2LPM [place] ABD: soft/nontender  EXT:  ulner and swan neck deformities in BLE hands Neuro: Opens eyes does not respond  PPS: 10%   This conversation/these recommendations were discussed with patient primary care team, Dr. Eliseo Squires  Billing based on MDM: High  Problems Addressed: One acute or chronic illness or injury that poses a threat to life or bodily function  Amount and/or Complexity of Data: Category 3:Discussion of management or test interpretation with external physician/other qualified health care professional/appropriate source (not separately reported)  Risks: Decision not to resuscitate or to de-escalate care because of poor prognosis ______________________________________________________ [place] Team Team Cell Phone: [phone] Please utilize secure chat with additional questions, if there is no response within 30 minutes please call the above phone number  Palliative Medicine Team providers are available by phone from 7am to 7pm daily and can be reached through the team cell phone.  Should this patient require  assistance outside of these hours, please call the patient's attending physician.

## 2021-10-17 DIAGNOSIS — M4646 Discitis, unspecified, lumbar region: Secondary | ICD-10-CM | POA: Diagnosis not present

## 2021-10-17 DIAGNOSIS — Z515 Encounter for palliative care: Secondary | ICD-10-CM | POA: Diagnosis not present

## 2021-10-17 DIAGNOSIS — Z7189 Other specified counseling: Secondary | ICD-10-CM | POA: Diagnosis not present

## 2021-10-17 NOTE — Progress Notes (Addendum)
Palliative Medicine Inpatient Follow Up Note   HPI: 76 y/o, with multiple co-morbidities including RA, DM, HFpEF, permanent a. Fib with episodes of RVR when sick, not a candidate for anticoagulation 2/2 h/o GI bleed from AVM, chronic blood loss anemia, CKD II, h/o CVA, Dementia, multiple pressure wounds and diabetic foot ulcers has had increasing back pain for several weeks. She is followed by Dr. Annette Stable with Neurosurgery. She had an MRI lumbar 10/06/21 revealing L4-5 discitis/osteomyelitis and a right psoas abscess. She was in IR dept for aspiration bx of lumbar lesion when she experienced atrial fibrillation with RVR.  Palliative care asked to get involved to address goals of care.   Today's Discussion 10/17/2021  *Please note that this is a verbal dictation therefore any spelling or grammatical errors are due to the "[place]" system interpretation.  Chart reviewed inclusive of vital signs, progress notes, laboratory results, and diagnostic images.   Per [facility] review patient continues with around-the-clock Ativan and Dilaudid.    [name] is comfortable and nondistressed at bedside this morning.  Her level of consciousness has declined since yesterday she no longer opens her eyes.  Nailbeds appear more mottled today though still has appreciable peripheral pulses.  I was able to speak to [name]'s son [name] this morning.  We discussed that [name] is getting closer to end-of-life and her departure could and likely would occur in the hospital.  I shared that if for any reason she should stabilize to a point where we could consider discharge then [name] stated a preference for her to go home with him.  Questions and concerns addressed/ Palliative Support Provided.  _____________________________________ Addendum:  Asked by nursing to go by this afternoon to update patient son [name] and DIL, [name].   Reviewed what we are doing presently for symptom management as well as the reasons why patient is  presently not clinically stable for transfer. Family asks if they can take her home though they are unable to provide 24/7 supportive care. We determined to allow [name] to stay here for the time being and if she becomes more stable we could discuss transfer to hospice home in [place].   Patient very uncomfortable during my time at bedside. Spoke to RN for pain and anxiety medications.   25 Minutes  Objective Assessment: Vital Signs Vitals:   10/17/21 0747 10/17/21 0755  BP:  (!) 152/95  Pulse:  (!) 150  Resp:  (!) 23  Temp:  (!) 97.4 F (36.3 C)  SpO2: 98% 95%    Intake/Output Summary (Last 24 hours) at 10/17/2021 3419 Last data filed at 10/16/2021 1625 Gross per 24 hour  Intake 0 ml  Output --  Net 0 ml   Last Weight  Most recent update: 10/16/2021  5:38 AM    Weight  57.1 kg (125 lb 14.1 oz)            Gen:  Very ill appearing Caucasian elderly F HEENT: Dry mucous membranes CV: Irregular rate and rhythm  PULM: ON RA this morning ABD: soft/nontender  EXT:  ulner and swan neck deformities in BLE hands Neuro: non-responsive  SUMMARY OF RECOMMENDATIONS   DNAR/DNI   Continue comfort care   Dilaudid and Ativan Q4H ATC   Unrestricted visitation   Palliative care will continue to follow and offer symptom support   Most likely patient will pass in house though if for any reason were to stabilize patient's son [name] would like her to go home with him under  hospice care  Billing based on MDM: High  Problems Addressed: One acute or chronic illness or injury that poses a threat to life or bodily function  Amount and/or Complexity of Data: Category 3:Discussion of management or test interpretation with external physician/other qualified health care professional/appropriate source (not separately reported)  Risks: Parenteral controlled substances ______________________________________________________________________________________ [place] Team Team Cell Phone: [phone] Please utilize secure chat with additional questions, if there is no response within 30 minutes please call the above phone number  Palliative Medicine Team providers are available by phone from 7am to 7pm daily and can be reached through the team cell phone.  Should this patient require assistance outside of these hours, please call the patient's attending physician.

## 2021-10-17 NOTE — Progress Notes (Signed)
PROGRESS NOTE    [name]  [id number]:[id number]  DOB: Jan 18, 1946  DOA: 10/26/2021 PCP: Aletha Halim., PA-C Outpatient Specialists:   Hospital course: 76 y/o, with multiple co-morbidities including RA, DM, HFpEF, permanent a. Fib with episodes of RVR when sick, not a candidate for anticoagulation 2/2 h/o GI bleed from AVM, chronic blood loss anemia, CKD II, h/o CVA, Dementia, multiple pressure wounds and diabetic foot ulcers has had increasing back pain for several weeks. She is followed by Dr. Annette Stable with Neurosurgery. She had an MRI lumbar 10/06/21 revealing L4-5 discitis/osteomyelitis and a right psoas abscess. She was in IR dept for aspiration bx of lumbar lesion when she experienced atrial fibrillation with RVR.   Patient was admitted and started on vancomycin cefepime and metronidazole.  Needed psoas muscle biopsy.  Poor QOL and prognosis-- in and out of the hospital with worsening dementia.  Family has transitioned to comfort care after meeting with palliative care.     Subjective: Does not awaken to voice or touch  Objective: Vitals:   10/16/21 0800 10/17/21 0036 10/17/21 0747 10/17/21 0755  BP: (!) 148/81 (!) 145/53  (!) 152/95  Pulse: 72 (!) 117  (!) 150  Resp: (!) 22 (!) 22  (!) 23  Temp:    (!) 97.4 F (36.3 C)  TempSrc:      SpO2: 99% 95% 98% 95%  Weight:      Height:        Intake/Output Summary (Last 24 hours) at 10/17/2021 0919 Last data filed at 10/16/2021 1625 Gross per 24 hour  Intake 0 ml  Output --  Net 0 ml   Filed Weights   10/14/21 0440 10/15/21 0500 10/16/21 0533  Weight: 64.9 kg 57.8 kg 57.1 kg     Exam:  General: Appearance:    Frail female in no acute distress, appears comfortable     Lungs:     respirations unlabored  Heart:    Tachycardic.               Assessment & Plan:   Encephalopathy complicating baseline dementia -now transitioned to comfort care  A fib with RVR  -long history of afib, typically RVR during prior  admissions in setting of systemic illness - has not been on PO meds due to AMS - now transitioned to comfort care  Lumbar discitis/right psoas abscess S/p disc aspiration--cultures NGTD Now transitioned to comfort care  HFpEF RA DM 2 -transitioned to comfort care    Right buttock decubitus stage III Pressure Injury 10/03/2021 Buttocks Right;Upper Stage 3 -  Full thickness tissue loss. Subcutaneous fat may be visible but bone, tendon or muscle are NOT exposed. (Active)  10/17/2021   Location: Buttocks  Location Orientation: Right;Upper  Staging: Stage 3 -  Full thickness tissue loss. Subcutaneous fat may be visible but bone, tendon or muscle are NOT exposed.  Wound Description (Comments):   Present on Admission: Yes    DVT prophylaxis: Lovenox Code Status: DNR Family Communication: called son Disposition Plan:   Patient is from: [facility]  Anticipated Discharge Location: [facility]    Scheduled Meds:  acetaminophen  975 mg Oral TID   cycloSPORINE  1 drop Both Eyes BID    HYDROmorphone (DILAUDID) injection  1 mg Intravenous Q4H   ketotifen  1 drop Both Eyes BID   LORazepam  1 mg Intravenous Q4H   mometasone-formoterol  2 puff Inhalation BID   Continuous Infusions:    Data Reviewed:  Basic Metabolic  Panel: Recent Labs  Lab 10/11/21 0546 10/12/21 0502 10/13/21 0135 10/14/21 0255 10/15/21 0449  NA 139 140 139 137 138  K 4.4 4.0 4.2 3.9 3.5  CL 105 107 106 108 105  CO2 19* 19* 18* 15* 15*  GLUCOSE 97 98 121* 85 100*  BUN 25* 21 27* 22 19  CREATININE 0.85 0.78 0.97 0.88 0.74  CALCIUM 9.7 10.0 9.9 10.0 10.0    CBC: Recent Labs  Lab 10/11/21 0546 10/13/21 0135 10/14/21 0255  WBC 9.8 9.6 10.6*  NEUTROABS 7.3  --   --   HGB 11.1* 12.0 13.9  HCT 35.9* 37.8 45.0  MCV 93.2 91.1 92.4  PLT 366 368 376    Studies: No results found.  Principal Problem:   Septic discitis of lumbar region Active Problems:   A-fib ([place])   Psoas abscess, right (HCC)    Type 2 diabetes mellitus with hyperlipidemia (HCC)   Rheumatoid arthritis (HCC)   Chronic diastolic (congestive) heart failure (HCC)   Pressure injury of skin   Dementia with agitation (HCC)   Hypertension   Gastroesophageal reflux disease   Hypomagnesemia   Atrial fibrillation with RVR ([place])     Saahil Herbster U Jevaughn Degollado,DO [facility]  If 7PM-7AM, please contact night-coverage [email or web address]   LOS: 9 days

## 2021-10-17 NOTE — Progress Notes (Signed)
      From palliative note patient has been transitioned to comfort care, inpatient cardiology will sign off      Signed, Carlyle Dolly, MD  10/17/2021, 9:02 AM

## 2021-10-18 DIAGNOSIS — M4646 Discitis, unspecified, lumbar region: Secondary | ICD-10-CM | POA: Diagnosis not present

## 2021-10-18 LAB — GLUCOSE, CAPILLARY
Glucose-Capillary: 123 mg/dL — ABNORMAL HIGH (ref 70–99)
Glucose-Capillary: 92 mg/dL (ref 70–99)
Glucose-Capillary: 116 mg/dL — ABNORMAL HIGH (ref 70–99)
Glucose-Capillary: 115 mg/dL — ABNORMAL HIGH (ref 70–99)
Glucose-Capillary: 111 mg/dL — ABNORMAL HIGH (ref 70–99)
Glucose-Capillary: 117 mg/dL — ABNORMAL HIGH (ref 70–99)
Glucose-Capillary: 111 mg/dL — ABNORMAL HIGH (ref 70–99)
Glucose-Capillary: 127 mg/dL — ABNORMAL HIGH (ref 70–99)

## 2021-10-18 NOTE — Progress Notes (Signed)
PROGRESS NOTE    [name]  [id number]:[id number]  DOB: 14-Mar-1946  DOA: 10/09/2021 PCP: Aletha Halim., PA-C Outpatient Specialists:   Hospital course: 76 y/o, with multiple co-morbidities including RA, DM, HFpEF, permanent a. Fib with episodes of RVR when sick, not a candidate for anticoagulation 2/2 h/o GI bleed from AVM, chronic blood loss anemia, CKD II, h/o CVA, Dementia, multiple pressure wounds and diabetic foot ulcers has had increasing back pain for several weeks. She is followed by Dr. Annette Stable with Neurosurgery. She had an MRI lumbar 10/06/21 revealing L4-5 discitis/osteomyelitis and a right psoas abscess. She was in IR dept for aspiration bx of lumbar lesion when she experienced atrial fibrillation with RVR.   Patient was admitted and started on vancomycin cefepime and metronidazole.  Needed psoas muscle biopsy.  Poor QOL and prognosis-- in and out of the hospital with worsening dementia.  Family has transitioned to comfort care after meeting with palliative care.     Subjective: Does not awaken to voice or touch. Agonal breathing.  Objective: Vitals:   10/17/21 0747 10/17/21 0755 10/17/21 2233 10/18/21 0735  BP:  (!) 152/95  (!) 146/81  Pulse:  (!) 150  61  Resp:  (!) 23    Temp:  (!) 97.4 F (36.3 C) (!) 100.5 F (38.1 C) 98.7 F (37.1 C)  TempSrc:   Axillary Oral  SpO2: 98% 95%  91%  Weight:      Height:        Intake/Output Summary (Last 24 hours) at 10/18/2021 1118 Last data filed at 10/18/2021 0825 Gross per 24 hour  Intake 0 ml  Output --  Net 0 ml   Filed Weights   10/14/21 0440 10/15/21 0500 10/16/21 0533  Weight: 64.9 kg 57.8 kg 57.1 kg     Exam:  General: Appearance:    Frail female in no acute distress, appears comfortable     Lungs:    agonal                 Assessment & Plan:   Encephalopathy complicating baseline dementia -now transitioned to comfort care  A fib with RVR  -long history of afib, typically RVR during prior  admissions in setting of systemic illness - has not been on PO meds due to AMS - now transitioned to comfort care  Lumbar discitis/right psoas abscess S/p disc aspiration--cultures NGTD Now transitioned to comfort care  HFpEF RA DM 2 -transitioned to comfort care    Right buttock decubitus stage III Pressure Injury 10/12/2021 Buttocks Right;Upper Stage 3 -  Full thickness tissue loss. Subcutaneous fat may be visible but bone, tendon or muscle are NOT exposed. (Active)  10/11/2021   Location: Buttocks  Location Orientation: Right;Upper  Staging: Stage 3 -  Full thickness tissue loss. Subcutaneous fat may be visible but bone, tendon or muscle are NOT exposed.  Wound Description (Comments):   Present on Admission: Yes    DVT prophylaxis: n/a Code Status: DNR Family Communication: son joel updated telephonically 7/17 Disposition Plan:   Patient is from: [facility]  Anticipated Discharge Location: hospital death vs home hospice    Scheduled Meds:  acetaminophen  975 mg Oral TID   cycloSPORINE  1 drop Both Eyes BID    HYDROmorphone (DILAUDID) injection  1 mg Intravenous Q4H   ketotifen  1 drop Both Eyes BID   LORazepam  1 mg Intravenous Q4H   mometasone-formoterol  2 puff Inhalation BID   Continuous Infusions:  Data Reviewed:  Basic Metabolic Panel: Recent Labs  Lab 10/12/21 0502 10/13/21 0135 10/14/21 0255 10/15/21 0449  NA 140 139 137 138  K 4.0 4.2 3.9 3.5  CL 107 106 108 105  CO2 19* 18* 15* 15*  GLUCOSE 98 121* 85 100*  BUN 21 27* 22 19  CREATININE 0.78 0.97 0.88 0.74  CALCIUM 10.0 9.9 10.0 10.0    CBC: Recent Labs  Lab 10/13/21 0135 10/14/21 0255  WBC 9.6 10.6*  HGB 12.0 13.9  HCT 37.8 45.0  MCV 91.1 92.4  PLT 368 376    Studies: No results found.  Principal Problem:   Septic discitis of lumbar region Active Problems:   A-fib (HCC)   Psoas abscess, right (HCC)   Type 2 diabetes mellitus with hyperlipidemia (HCC)   Rheumatoid  arthritis (HCC)   Chronic diastolic (congestive) heart failure (HCC)   Pressure injury of skin   Dementia with agitation (HCC)   Hypertension   Gastroesophageal reflux disease   Hypomagnesemia   Atrial fibrillation with RVR (HCC)     Desma Maxim, MD [facility]  If 7PM-7AM, please contact night-coverage [email or web address]   LOS: 10 days

## 2021-10-18 NOTE — Progress Notes (Signed)
Patient [name]      DOB: 04-17-1945      PVV:[id number]      Palliative Medicine Team    Subjective: Bedside symptom check completed. No family or visitors bedside at time of visit.    Physical exam: Patient resting in bed with eyes closed at time of visit. Breathing even, slightly tachypneac, slightly labored, no excessive secretions noted. Patient without physical or non-verbal signs of pain or discomfort at this time. Patient is not responsive to this RN bedside with verbal or light tactile stimulation. Patient warm to touch in all extremities, radial pulses palpated. Unable to palpate pedal pulses this morning, feet slightly cooler than rest of body.    Assessment and plan: This RN administered PRN medication for work of breathing (see eMAR). Patient appears to be resting comfortably. Bedside RN, [name] without additional needs or concerns this morning. Will continue to follow for any changes or advances.    Thank you for allowing the Palliative Medicine Team to assist in the care of this patient.     Damian Leavell, MSN, RN Palliative Medicine Team Team Phone: [phone]  This phone is monitored 7a-7p, please reach out to attending physician outside of these hours for urgent needs.

## 2021-10-19 DIAGNOSIS — M4646 Discitis, unspecified, lumbar region: Secondary | ICD-10-CM | POA: Diagnosis not present

## 2021-10-19 LAB — AEROBIC/ANAEROBIC CULTURE W GRAM STAIN (SURGICAL/DEEP WOUND): Gram Stain: NONE SEEN

## 2021-10-20 ENCOUNTER — Ambulatory Visit: Payer: 59 | Admitting: Physician Assistant

## 2021-11-02 NOTE — Progress Notes (Signed)
Patient having increased labored breathing now using accessory muscles. Son's significant other visited this afternoon and told this nurse that son works out of town but she is going to speak with him about possibly coming tonight or tomorrow morning. PRN Dilaudid given, no family currently at bedside.

## 2021-11-02 NOTE — Progress Notes (Signed)
Patient expired at 1720, pronounced by this nurse and Rolla Plate, RN. Dr. Broadus John notified and son, Fara Olden notified. Per son, requested for wheelchair at bedside to be donated.

## 2021-11-02 NOTE — Progress Notes (Addendum)
Patient [name]      DOB: 08/15/1945      NMM:[id number]      Palliative Medicine Team    Subjective: Bedside symptom check completed. No family or visitors present at time of visit.    Physical exam: Patient resting in bed with eyes closed at time of visit. Breathing even, slightly labored with abdominal effort, no excessive secretions noted. Patient without physical or non-verbal signs of pain or discomfort at this time. Patient unresponsive to this RN bedside by both verbal and light tactile stimulation. This RN did not further attempt response, to encourage comfort and rest. Patient with palpable radial pulses bilaterally. Lower bilateral extremities cool to touch up to thigh, unable to palpate pulse, progressed from exam yesterday. Slight discoloration noted in toes and bottom of feet bilaterally.   Assessment and plan: Patient progressing toward EOL. Anticipate in hospital death. Bedside RN Mardene Celeste without needs or concerns this morning. Will continue to follow for any changes or advances.    Thank you for allowing the Palliative Medicine Team to assist in the care of this patient.     Damian Leavell, MSN, RN Palliative Medicine Team Team Phone: [phone]  This phone is monitored 7a-7p, please reach out to attending physician outside of these hours for urgent needs.

## 2021-11-02 NOTE — Progress Notes (Addendum)
PROGRESS NOTE    DAJAHNAE VONDRA  KVQ:[id number]  DOB: [birth year]  DOA: 10/05/2021 PCP: Aletha Halim., PA-C Outpatient Specialists:   Hospital course: 76 y/o, with multiple co-morbidities including RA, DM, HFpEF, permanent a. Fib with episodes of RVR when sick, not a candidate for anticoagulation 2/2 h/o GI bleed from AVM, chronic blood loss anemia, CKD II, h/o CVA, Dementia, multiple pressure wounds and diabetic foot ulcers has had increasing back pain for several weeks. She is followed by Dr. Annette Stable with Neurosurgery. She had an MRI lumbar 10/06/21 revealing L4-5 discitis/osteomyelitis and a right psoas abscess. She was in IR dept for aspiration bx of lumbar lesion when she experienced atrial fibrillation with RVR.   Patient was admitted and started on vancomycin cefepime and metronidazole.  Needed psoas muscle biopsy.  Poor QOL and prognosis-- in and out of the hospital with infectious complications, worsening encephalopathy, dementia. S/p palliative care meeting, transitioned to comfort care  Subjective: -No events overnight, resting comfortably, getting as needed Dilaudid  Objective: Vitals:   10/17/21 0755 10/17/21 2233 10/18/21 0735 10/18/21 2216  BP: (!) 152/95  (!) 146/81 135/88  Pulse: (!) 150  61 98  Resp: (!) 23   (!) 23  Temp: (!) 97.4 F (36.3 C) (!) 100.5 F (38.1 C) 98.7 F (37.1 C) 99.5 F (37.5 C)  TempSrc:  Axillary Oral Oral  SpO2: 95%  91% 93%  Weight:      Height:        Intake/Output Summary (Last 24 hours) at 10-31-2021 1427 Last data filed at 10/18/2021 1900 Gross per 24 hour  Intake 0 ml  Output --  Net 0 ml   Filed Weights   10/14/21 0440 10/15/21 0500 10/16/21 0533  Weight: 64.9 kg 57.8 kg 57.1 kg     Exam:  Unresponsive, resting comfortably      Assessment & Plan:   Encephalopathy complicating baseline dementia -now transitioned to comfort care  A fib with RVR  -long history of afib, typically RVR during prior admissions in setting  of systemic illness - has not been on PO meds due to AMS - now transitioned to comfort care  Lumbar discitis/right psoas abscess S/p disc aspiration--cultures NGTD Now transitioned to comfort care  HFpEF RA DM 2 -transitioned to comfort care    Right buttock decubitus stage III Pressure Injury 10/30/2021 Buttocks Right;Upper Stage 3 -  Full thickness tissue loss. Subcutaneous fat may be visible but bone, tendon or muscle are NOT exposed. (Active)  10/28/2021   Location: Buttocks  Location Orientation: Right;Upper  Staging: Stage 3 -  Full thickness tissue loss. Subcutaneous fat may be visible but bone, tendon or muscle are NOT exposed.  Wound Description (Comments):   Present on Admission: Yes    DVT prophylaxis: n/a Code Status: DNR Family Communication: No family at bedside Disposition Plan:   [facility]: [facility]  Anticipated Discharge Location: Anticipate hospital death    Scheduled Meds:  cycloSPORINE  1 drop Both Eyes BID    HYDROmorphone (DILAUDID) injection  1 mg Intravenous Q4H   ketotifen  1 drop Both Eyes BID   LORazepam  1 mg Intravenous Q4H   mometasone-formoterol  2 puff Inhalation BID   Continuous Infusions:    Data Reviewed:  Basic Metabolic Panel: Recent Labs  Lab 10/13/21 0135 10/14/21 0255 10/15/21 0449  NA 139 137 138  K 4.2 3.9 3.5  CL 106 108 105  CO2 18* 15* 15*  GLUCOSE 121* 85 100*  BUN 27* 22 19  CREATININE 0.97 0.88 0.74  CALCIUM 9.9 10.0 10.0    CBC: Recent Labs  Lab 10/13/21 0135 10/14/21 0255  WBC 9.6 10.6*  HGB 12.0 13.9  HCT 37.8 45.0  MCV 91.1 92.4  PLT 368 376    Studies:   Domenic Polite, MD [facility]  If 7PM-7AM, please contact night-coverage [email or web address]   LOS: 11 days

## 2021-11-02 DEATH — deceased

## 2021-11-09 ENCOUNTER — Ambulatory Visit: Payer: 59 | Admitting: General Practice

## 2021-11-19 LAB — FUNGUS CULTURE RESULT

## 2021-11-19 LAB — FUNGUS CULTURE WITH STAIN

## 2021-11-19 LAB — FUNGAL ORGANISM REFLEX

## 2021-12-03 NOTE — Death Summary Note (Signed)
Death Summary  [name] EHU:[id number] DOB: [birth year] DOA: 10/31/2021   PCP: Aletha Halim., PA-C     Admit date: Oct 19, 2021 Date of Death: 10-30-2021   Final Diagnoses:  Principal Problem:   Septic discitis of lumbar region   Dementia   Acute metabolic encephalopathy   A-fib (HCC)   Psoas abscess, right (HCC)   Type 2 diabetes mellitus with hyperlipidemia (HCC)   Rheumatoid arthritis (HCC)   Chronic diastolic (congestive) heart failure (HCC)   Dementia with agitation (HCC)   Hypertension   Gastroesophageal reflux disease   Hypomagnesemia   Atrial fibrillation with RVR (HCC)       History of present illness:   76 y/o, with multiple co-morbidities including RA, DM, HFpEF, permanent a. Fib with episodes of RVR when sick, not a candidate for anticoagulation 2/2 h/o GI bleed from AVM, chronic blood loss anemia, CKD II, h/o CVA, Dementia, multiple pressure wounds and diabetic foot ulcers has had increasing back pain for several weeks. She is followed by Dr. Annette Stable with Neurosurgery. She had an MRI lumbar 10/06/21 revealing L4-5 discitis/osteomyelitis and a right psoas abscess. She was in IR dept for aspiration bx of lumbar lesion when she experienced atrial fibrillation with RVR.   Patient was admitted with failure to thrive   Hospital Course:  76 y/o, with multiple co-morbidities including Dementia, RA, DM, HFpEF, permanent a. Fib with episodes of RVR when sick, not a candidate for anticoagulation 2/2 h/o GI bleed from AVM, chronic blood loss anemia, CKD II, h/o CVA,  multiple pressure wounds and diabetic foot ulcers has had increasing back pain for several weeks. She is followed by Dr. Annette Stable with Neurosurgery. She had an MRI lumbar 10/06/21 revealing L4-5 discitis/osteomyelitis and a right psoas abscess. She was in IR dept for aspiration bx of lumbar lesion when she experienced atrial fibrillation with RVR.   Patient was admitted and started on vancomycin cefepime and metronidazole.  Needed  psoas muscle biopsy.  Poor QOL and prognosis-- in and out of the hospital with infectious complications, worsening encephalopathy, dementia. S/p palliative care meeting, transitioned to comfort care on 7/16 -I picked up patients care today and she expired the same evening on 10/31/2022     Time: 43mn   Signed:   [facility] 11/04/2021, 3:25 PM               Routing History             Note Details  Author JDomenic Polite MD File Time 11/04/2021  3:30 PM  Author Type Physician Status Signed  Last Editor [place]ic [place]ite [place]# [id number] Date 72023/07/18

## 2021-12-03 NOTE — Discharge Summary (Deleted)
Death Summary  EULALIA ELLERMAN SUP:[id number] DOB: Jul 15, 1945 DOA: October 27, 2021  PCP: Aletha Halim., PA-C   Admit date: 10/27/2021 Date of Death: 07-Nov-2021  Final Diagnoses:  Principal Problem:   Septic discitis of lumbar region   Dementia   Acute metabolic encephalopathy   A-fib (HCC)   Psoas abscess, right (HCC)   Type 2 diabetes mellitus with hyperlipidemia (HCC)   Rheumatoid arthritis (HCC)   Chronic diastolic (congestive) heart failure (HCC)   Dementia with agitation (HCC)   Hypertension   Gastroesophageal reflux disease   Hypomagnesemia   Atrial fibrillation with RVR ([place])    History of present illness:    Hospital Course:  76 y/o, with multiple co-morbidities including Dementia, RA, DM, HFpEF, permanent a. Fib with episodes of RVR when sick, not a candidate for anticoagulation 2/2 h/o GI bleed from AVM, chronic blood loss anemia, CKD II, h/o CVA,  multiple pressure wounds and diabetic foot ulcers has had increasing back pain for several weeks. She is followed by Dr. Annette Stable with Neurosurgery. She had an MRI lumbar 10/06/21 revealing L4-5 discitis/osteomyelitis and a right psoas abscess. She was in IR dept for aspiration bx of lumbar lesion when she experienced atrial fibrillation with RVR.   Patient was admitted and started on vancomycin cefepime and metronidazole.  Needed psoas muscle biopsy.  Poor QOL and prognosis-- in and out of the hospital with infectious complications, worsening encephalopathy, dementia. S/p palliative care meeting, transitioned to comfort care on 7/16 -I picked up patients care today and she expired the same evening on Nov 08, 2022   Time: 84mn  Signed:  [facility] 11/04/2021, 3:25 PM
